# Patient Record
Sex: Male | Born: 1940 | Race: White | Hispanic: No | Marital: Married | Smoking: Former smoker
Health system: Southern US, Community
[De-identification: ages and names within clinical notes are randomized; demographics above are authoritative.]

## PROBLEM LIST (undated history)

## (undated) ENCOUNTER — Emergency Department (HOSPITAL_COMMUNITY): Admission: EM | Source: Ambulatory Visit | Attending: Emergency Medicine | Admitting: Emergency Medicine

## (undated) DIAGNOSIS — M545 Low back pain, unspecified: Secondary | ICD-10-CM

## (undated) DIAGNOSIS — I639 Cerebral infarction, unspecified: Secondary | ICD-10-CM

## (undated) DIAGNOSIS — M1990 Unspecified osteoarthritis, unspecified site: Secondary | ICD-10-CM

## (undated) DIAGNOSIS — R011 Cardiac murmur, unspecified: Secondary | ICD-10-CM

## (undated) DIAGNOSIS — E785 Hyperlipidemia, unspecified: Secondary | ICD-10-CM

## (undated) DIAGNOSIS — C801 Malignant (primary) neoplasm, unspecified: Secondary | ICD-10-CM

## (undated) DIAGNOSIS — J449 Chronic obstructive pulmonary disease, unspecified: Secondary | ICD-10-CM

## (undated) DIAGNOSIS — I2510 Atherosclerotic heart disease of native coronary artery without angina pectoris: Secondary | ICD-10-CM

## (undated) DIAGNOSIS — G459 Transient cerebral ischemic attack, unspecified: Secondary | ICD-10-CM

## (undated) DIAGNOSIS — I714 Abdominal aortic aneurysm, without rupture, unspecified: Secondary | ICD-10-CM

## (undated) DIAGNOSIS — K750 Abscess of liver: Secondary | ICD-10-CM

## (undated) DIAGNOSIS — Z923 Personal history of irradiation: Secondary | ICD-10-CM

## (undated) DIAGNOSIS — B019 Varicella without complication: Secondary | ICD-10-CM

## (undated) DIAGNOSIS — F32A Depression, unspecified: Secondary | ICD-10-CM

## (undated) DIAGNOSIS — I350 Nonrheumatic aortic (valve) stenosis: Secondary | ICD-10-CM

## (undated) DIAGNOSIS — K449 Diaphragmatic hernia without obstruction or gangrene: Secondary | ICD-10-CM

## (undated) DIAGNOSIS — N400 Enlarged prostate without lower urinary tract symptoms: Secondary | ICD-10-CM

## (undated) DIAGNOSIS — J189 Pneumonia, unspecified organism: Secondary | ICD-10-CM

## (undated) DIAGNOSIS — G8929 Other chronic pain: Secondary | ICD-10-CM

## (undated) DIAGNOSIS — D126 Benign neoplasm of colon, unspecified: Secondary | ICD-10-CM

## (undated) DIAGNOSIS — I509 Heart failure, unspecified: Secondary | ICD-10-CM

## (undated) DIAGNOSIS — I10 Essential (primary) hypertension: Secondary | ICD-10-CM

## (undated) DIAGNOSIS — B1910 Unspecified viral hepatitis B without hepatic coma: Secondary | ICD-10-CM

## (undated) DIAGNOSIS — K222 Esophageal obstruction: Secondary | ICD-10-CM

## (undated) DIAGNOSIS — K5790 Diverticulosis of intestine, part unspecified, without perforation or abscess without bleeding: Secondary | ICD-10-CM

## (undated) DIAGNOSIS — K219 Gastro-esophageal reflux disease without esophagitis: Secondary | ICD-10-CM

## (undated) DIAGNOSIS — R0600 Dyspnea, unspecified: Secondary | ICD-10-CM

## (undated) HISTORY — PX: CATARACT EXTRACTION W/ INTRAOCULAR LENS  IMPLANT, BILATERAL: SHX1307

## (undated) HISTORY — DX: Cerebral infarction, unspecified: I63.9

## (undated) HISTORY — DX: Gastro-esophageal reflux disease without esophagitis: K21.9

## (undated) HISTORY — DX: Varicella without complication: B01.9

## (undated) HISTORY — DX: Unspecified osteoarthritis, unspecified site: M19.90

## (undated) HISTORY — PX: BACK SURGERY: SHX140

## (undated) HISTORY — DX: Diaphragmatic hernia without obstruction or gangrene: K44.9

## (undated) HISTORY — PX: MULTIPLE TOOTH EXTRACTIONS: SHX2053

## (undated) HISTORY — DX: Hyperlipidemia, unspecified: E78.5

## (undated) HISTORY — DX: Nonrheumatic aortic (valve) stenosis: I35.0

## (undated) HISTORY — DX: Diverticulosis of intestine, part unspecified, without perforation or abscess without bleeding: K57.90

## (undated) HISTORY — DX: Transient cerebral ischemic attack, unspecified: G45.9

## (undated) HISTORY — PX: VASCULAR SURGERY: SHX849

## (undated) HISTORY — DX: Benign neoplasm of colon, unspecified: D12.6

## (undated) HISTORY — DX: Essential (primary) hypertension: I10

## (undated) HISTORY — PX: ESOPHAGOGASTRODUODENOSCOPY (EGD) WITH ESOPHAGEAL DILATION: SHX5812

## (undated) HISTORY — PX: PROSTATE BIOPSY: SHX241

## (undated) HISTORY — DX: Benign prostatic hyperplasia without lower urinary tract symptoms: N40.0

## (undated) HISTORY — DX: Atherosclerotic heart disease of native coronary artery without angina pectoris: I25.10

## (undated) HISTORY — DX: Esophageal obstruction: K22.2

## (undated) HISTORY — PX: CARDIAC CATHETERIZATION: SHX172

## (undated) HISTORY — PX: TONSILLECTOMY: SUR1361

---

## 1982-02-05 DIAGNOSIS — B1910 Unspecified viral hepatitis B without hepatic coma: Secondary | ICD-10-CM

## 1982-02-05 HISTORY — PX: LAPAROSCOPIC CHOLECYSTECTOMY: SUR755

## 1982-02-05 HISTORY — DX: Unspecified viral hepatitis B without hepatic coma: B19.10

## 1992-02-06 HISTORY — PX: LAPAROSCOPIC CHOLECYSTECTOMY: SUR755

## 1996-05-06 HISTORY — PX: LUMBAR DISC SURGERY: SHX700

## 1996-09-05 HISTORY — PX: POSTERIOR LUMBAR FUSION: SHX6036

## 1998-05-13 ENCOUNTER — Emergency Department (HOSPITAL_COMMUNITY): Admission: EM | Admit: 1998-05-13 | Discharge: 1998-05-13 | Payer: Self-pay | Admitting: Emergency Medicine

## 1998-05-13 ENCOUNTER — Encounter: Payer: Self-pay | Admitting: Emergency Medicine

## 1998-08-11 ENCOUNTER — Encounter: Payer: Self-pay | Admitting: Gastroenterology

## 1998-12-06 ENCOUNTER — Encounter: Payer: Self-pay | Admitting: Pulmonary Disease

## 1998-12-06 ENCOUNTER — Inpatient Hospital Stay (HOSPITAL_COMMUNITY): Admission: AD | Admit: 1998-12-06 | Discharge: 1998-12-08 | Payer: Self-pay | Admitting: Pulmonary Disease

## 1998-12-06 ENCOUNTER — Encounter: Payer: Self-pay | Admitting: Neurology

## 1998-12-07 ENCOUNTER — Encounter: Payer: Self-pay | Admitting: Pulmonary Disease

## 2002-09-28 ENCOUNTER — Ambulatory Visit (HOSPITAL_COMMUNITY): Admission: RE | Admit: 2002-09-28 | Discharge: 2002-09-28 | Payer: Self-pay | Admitting: Neurosurgery

## 2002-09-28 ENCOUNTER — Encounter: Payer: Self-pay | Admitting: Neurosurgery

## 2002-10-06 ENCOUNTER — Encounter: Payer: Self-pay | Admitting: Neurosurgery

## 2002-10-07 HISTORY — PX: LUMBAR LAMINECTOMY/DECOMPRESSION MICRODISCECTOMY: SHX5026

## 2002-10-08 ENCOUNTER — Inpatient Hospital Stay (HOSPITAL_COMMUNITY): Admission: RE | Admit: 2002-10-08 | Discharge: 2002-10-09 | Payer: Self-pay | Admitting: Neurosurgery

## 2002-10-08 ENCOUNTER — Encounter: Payer: Self-pay | Admitting: Neurosurgery

## 2003-12-15 ENCOUNTER — Ambulatory Visit: Payer: Self-pay | Admitting: Pulmonary Disease

## 2004-01-11 ENCOUNTER — Ambulatory Visit: Payer: Self-pay | Admitting: Pulmonary Disease

## 2004-02-18 ENCOUNTER — Ambulatory Visit: Payer: Self-pay | Admitting: Pulmonary Disease

## 2004-07-19 ENCOUNTER — Ambulatory Visit: Payer: Self-pay | Admitting: Pulmonary Disease

## 2004-11-29 ENCOUNTER — Ambulatory Visit: Payer: Self-pay | Admitting: Pulmonary Disease

## 2005-01-15 ENCOUNTER — Ambulatory Visit: Payer: Self-pay | Admitting: Pulmonary Disease

## 2005-01-22 ENCOUNTER — Ambulatory Visit: Payer: Self-pay | Admitting: Pulmonary Disease

## 2005-05-24 ENCOUNTER — Ambulatory Visit: Payer: Self-pay | Admitting: Pulmonary Disease

## 2005-11-20 ENCOUNTER — Ambulatory Visit: Payer: Self-pay | Admitting: Pulmonary Disease

## 2006-01-24 ENCOUNTER — Ambulatory Visit: Payer: Self-pay | Admitting: Internal Medicine

## 2006-01-24 ENCOUNTER — Ambulatory Visit: Payer: Self-pay | Admitting: Pulmonary Disease

## 2006-02-18 ENCOUNTER — Ambulatory Visit: Payer: Self-pay | Admitting: Internal Medicine

## 2006-11-14 ENCOUNTER — Ambulatory Visit: Payer: Self-pay | Admitting: Pulmonary Disease

## 2006-12-17 ENCOUNTER — Ambulatory Visit: Payer: Self-pay | Admitting: Pulmonary Disease

## 2006-12-30 ENCOUNTER — Ambulatory Visit: Payer: Self-pay | Admitting: Pulmonary Disease

## 2006-12-30 LAB — CONVERTED CEMR LAB
ALT: 23 units/L (ref 0–53)
AST: 21 units/L (ref 0–37)
Albumin: 3.8 g/dL (ref 3.5–5.2)
Alkaline Phosphatase: 102 units/L (ref 39–117)
BUN: 10 mg/dL (ref 6–23)
Basophils Absolute: 0.1 10*3/uL (ref 0.0–0.1)
Basophils Relative: 1.5 % — ABNORMAL HIGH (ref 0.0–1.0)
Bilirubin, Direct: 0.1 mg/dL (ref 0.0–0.3)
CO2: 28 meq/L (ref 19–32)
Calcium: 9.3 mg/dL (ref 8.4–10.5)
Chloride: 104 meq/L (ref 96–112)
Cholesterol: 162 mg/dL (ref 0–200)
Creatinine, Ser: 0.9 mg/dL (ref 0.4–1.5)
Eosinophils Absolute: 0.2 10*3/uL (ref 0.0–0.6)
Eosinophils Relative: 3.4 % (ref 0.0–5.0)
GFR calc Af Amer: 109 mL/min
GFR calc non Af Amer: 90 mL/min
Glucose, Bld: 115 mg/dL — ABNORMAL HIGH (ref 70–99)
HCT: 43.5 % (ref 39.0–52.0)
HDL: 30.1 mg/dL — ABNORMAL LOW (ref >39.0)
Hemoglobin: 15.3 g/dL (ref 13.0–17.0)
LDL Cholesterol: 101 mg/dL — ABNORMAL HIGH (ref 0–99)
Lymphocytes Relative: 26.8 % (ref 12.0–46.0)
MCHC: 35.1 g/dL (ref 30.0–36.0)
MCV: 87.6 fL (ref 78.0–100.0)
Monocytes Absolute: 0.5 10*3/uL (ref 0.2–0.7)
Monocytes Relative: 7.6 % (ref 3.0–11.0)
Neutro Abs: 4.3 10*3/uL (ref 1.4–7.7)
Neutrophils Relative %: 60.7 % (ref 43.0–77.0)
PSA: 3.32 ng/mL (ref 0.10–4.00)
Platelets: 209 10*3/uL (ref 150–400)
Potassium: 4.5 meq/L (ref 3.5–5.1)
RBC: 4.97 M/uL (ref 4.22–5.81)
RDW: 12.1 % (ref 11.5–14.6)
Sodium: 140 meq/L (ref 135–145)
TSH: 0.73 microintl units/mL (ref 0.35–5.50)
Total Bilirubin: 0.7 mg/dL (ref 0.3–1.2)
Total CHOL/HDL Ratio: 5.4
Total Protein: 6.7 g/dL (ref 6.0–8.3)
Triglycerides: 153 mg/dL — ABNORMAL HIGH (ref 0–149)
VLDL: 31 mg/dL (ref 0–40)
WBC: 6.9 10*3/uL (ref 4.5–10.5)

## 2007-02-06 DIAGNOSIS — D126 Benign neoplasm of colon, unspecified: Secondary | ICD-10-CM

## 2007-02-06 HISTORY — DX: Benign neoplasm of colon, unspecified: D12.6

## 2007-03-17 ENCOUNTER — Telehealth: Payer: Self-pay | Admitting: Pulmonary Disease

## 2007-03-19 DIAGNOSIS — F411 Generalized anxiety disorder: Secondary | ICD-10-CM | POA: Insufficient documentation

## 2007-03-19 DIAGNOSIS — I10 Essential (primary) hypertension: Secondary | ICD-10-CM | POA: Insufficient documentation

## 2007-03-19 DIAGNOSIS — K219 Gastro-esophageal reflux disease without esophagitis: Secondary | ICD-10-CM | POA: Insufficient documentation

## 2007-03-19 DIAGNOSIS — E785 Hyperlipidemia, unspecified: Secondary | ICD-10-CM | POA: Insufficient documentation

## 2007-08-27 ENCOUNTER — Ambulatory Visit: Payer: Self-pay | Admitting: Pulmonary Disease

## 2007-08-27 DIAGNOSIS — K5730 Diverticulosis of large intestine without perforation or abscess without bleeding: Secondary | ICD-10-CM | POA: Insufficient documentation

## 2007-08-27 DIAGNOSIS — G463 Brain stem stroke syndrome: Secondary | ICD-10-CM | POA: Insufficient documentation

## 2007-08-27 DIAGNOSIS — Z87438 Personal history of other diseases of male genital organs: Secondary | ICD-10-CM | POA: Insufficient documentation

## 2007-08-27 DIAGNOSIS — M545 Low back pain, unspecified: Secondary | ICD-10-CM | POA: Insufficient documentation

## 2007-08-27 DIAGNOSIS — Z87898 Personal history of other specified conditions: Secondary | ICD-10-CM

## 2007-08-27 DIAGNOSIS — I6350 Cerebral infarction due to unspecified occlusion or stenosis of unspecified cerebral artery: Secondary | ICD-10-CM

## 2007-08-27 DIAGNOSIS — J309 Allergic rhinitis, unspecified: Secondary | ICD-10-CM | POA: Insufficient documentation

## 2007-09-01 ENCOUNTER — Ambulatory Visit: Payer: Self-pay | Admitting: Pulmonary Disease

## 2007-09-11 ENCOUNTER — Telehealth (INDEPENDENT_AMBULATORY_CARE_PROVIDER_SITE_OTHER): Payer: Self-pay | Admitting: *Deleted

## 2007-09-11 LAB — CONVERTED CEMR LAB
AST: 18 units/L (ref 0–37)
Basophils Absolute: 0.1 10*3/uL (ref 0.0–0.1)
Basophils Relative: 0.8 % (ref 0.0–3.0)
Chloride: 107 meq/L (ref 96–112)
Cholesterol: 146 mg/dL (ref 0–200)
Creatinine, Ser: 0.8 mg/dL (ref 0.4–1.5)
Eosinophils Absolute: 0.2 10*3/uL (ref 0.0–0.7)
GFR calc Af Amer: 125 mL/min
GFR calc non Af Amer: 103 mL/min
HDL: 26.9 mg/dL — ABNORMAL LOW (ref >39.0)
MCHC: 34.3 g/dL (ref 30.0–36.0)
MCV: 89.9 fL (ref 78.0–100.0)
Monocytes Absolute: 0.4 10*3/uL (ref 0.1–1.0)
Neutrophils Relative %: 65.7 % (ref 43.0–77.0)
PSA: 2.18 ng/mL (ref 0.10–4.00)
Platelets: 193 10*3/uL (ref 150–400)
Potassium: 4.5 meq/L (ref 3.5–5.1)
Total Bilirubin: 0.6 mg/dL (ref 0.3–1.2)
VLDL: 38 mg/dL (ref 0–40)

## 2007-09-16 ENCOUNTER — Encounter: Payer: Self-pay | Admitting: Pulmonary Disease

## 2007-09-16 ENCOUNTER — Ambulatory Visit: Payer: Self-pay

## 2007-09-24 ENCOUNTER — Ambulatory Visit: Payer: Self-pay | Admitting: Gastroenterology

## 2007-09-24 ENCOUNTER — Telehealth: Payer: Self-pay | Admitting: Pulmonary Disease

## 2007-09-24 DIAGNOSIS — K591 Functional diarrhea: Secondary | ICD-10-CM | POA: Insufficient documentation

## 2007-10-03 ENCOUNTER — Ambulatory Visit: Payer: Self-pay

## 2007-10-09 ENCOUNTER — Ambulatory Visit: Payer: Self-pay | Admitting: Cardiology

## 2007-10-21 ENCOUNTER — Encounter: Payer: Self-pay | Admitting: Gastroenterology

## 2007-10-21 ENCOUNTER — Ambulatory Visit: Payer: Self-pay | Admitting: Gastroenterology

## 2007-10-22 ENCOUNTER — Encounter: Payer: Self-pay | Admitting: Gastroenterology

## 2007-10-31 ENCOUNTER — Telehealth (INDEPENDENT_AMBULATORY_CARE_PROVIDER_SITE_OTHER): Payer: Self-pay | Admitting: *Deleted

## 2007-11-11 ENCOUNTER — Encounter: Payer: Self-pay | Admitting: Pulmonary Disease

## 2007-12-01 ENCOUNTER — Ambulatory Visit: Payer: Self-pay | Admitting: Gastroenterology

## 2007-12-01 DIAGNOSIS — Z860100 Personal history of colon polyps, unspecified: Secondary | ICD-10-CM | POA: Insufficient documentation

## 2007-12-04 ENCOUNTER — Ambulatory Visit: Payer: Self-pay | Admitting: Pulmonary Disease

## 2007-12-16 ENCOUNTER — Encounter: Payer: Self-pay | Admitting: Pulmonary Disease

## 2008-01-28 DIAGNOSIS — I359 Nonrheumatic aortic valve disorder, unspecified: Secondary | ICD-10-CM

## 2008-01-28 DIAGNOSIS — I350 Nonrheumatic aortic (valve) stenosis: Secondary | ICD-10-CM | POA: Insufficient documentation

## 2008-02-25 ENCOUNTER — Telehealth: Payer: Self-pay | Admitting: Pulmonary Disease

## 2008-03-29 ENCOUNTER — Telehealth: Payer: Self-pay | Admitting: Pulmonary Disease

## 2008-05-20 ENCOUNTER — Ambulatory Visit: Payer: Self-pay | Admitting: Pulmonary Disease

## 2008-05-25 ENCOUNTER — Ambulatory Visit: Payer: Self-pay | Admitting: Pulmonary Disease

## 2008-05-30 LAB — CONVERTED CEMR LAB
AST: 20 units/L (ref 0–37)
Alkaline Phosphatase: 105 units/L (ref 39–117)
BUN: 7 mg/dL (ref 6–23)
Basophils Absolute: 0.2 10*3/uL — ABNORMAL HIGH (ref 0.0–0.1)
Chloride: 108 meq/L (ref 96–112)
Cholesterol: 144 mg/dL (ref 0–200)
Direct LDL: 81.2 mg/dL
Eosinophils Absolute: 0.2 10*3/uL (ref 0.0–0.7)
GFR calc non Af Amer: 102.91 mL/min (ref >60)
HDL: 29 mg/dL — ABNORMAL LOW (ref >39.00)
Hemoglobin, Urine: NEGATIVE
Hemoglobin: 15.2 g/dL (ref 13.0–17.0)
Lymphocytes Relative: 25.2 % (ref 12.0–46.0)
MCHC: 34.1 g/dL (ref 30.0–36.0)
Monocytes Relative: 5.2 % (ref 3.0–12.0)
Neutrophils Relative %: 64.3 % (ref 43.0–77.0)
Nitrite: NEGATIVE
Platelets: 169 10*3/uL (ref 150.0–400.0)
Potassium: 4.4 meq/L (ref 3.5–5.1)
RDW: 12.5 % (ref 11.5–14.6)
Sodium: 144 meq/L (ref 135–145)
Specific Gravity, Urine: 1.015 (ref 1.000–1.030)
Total Bilirubin: 0.7 mg/dL (ref 0.3–1.2)
Total CHOL/HDL Ratio: 5
Total Protein, Urine: NEGATIVE mg/dL
Triglycerides: 205 mg/dL — ABNORMAL HIGH (ref 0.0–149.0)
Urine Glucose: NEGATIVE mg/dL
Urobilinogen, UA: 0.2 (ref 0.0–1.0)
VLDL: 41 mg/dL — ABNORMAL HIGH (ref 0.0–40.0)
Vit D, 25-Hydroxy: 33 ng/mL (ref 30–89)

## 2008-06-22 ENCOUNTER — Telehealth (INDEPENDENT_AMBULATORY_CARE_PROVIDER_SITE_OTHER): Payer: Self-pay | Admitting: *Deleted

## 2008-08-02 ENCOUNTER — Ambulatory Visit: Payer: Self-pay | Admitting: Cardiology

## 2008-08-02 DIAGNOSIS — R079 Chest pain, unspecified: Secondary | ICD-10-CM

## 2008-08-02 DIAGNOSIS — I209 Angina pectoris, unspecified: Secondary | ICD-10-CM | POA: Insufficient documentation

## 2008-08-11 ENCOUNTER — Telehealth (INDEPENDENT_AMBULATORY_CARE_PROVIDER_SITE_OTHER): Payer: Self-pay

## 2008-08-12 ENCOUNTER — Encounter: Payer: Self-pay | Admitting: Cardiology

## 2008-08-12 ENCOUNTER — Ambulatory Visit: Payer: Self-pay

## 2008-08-17 ENCOUNTER — Encounter: Payer: Self-pay | Admitting: Cardiology

## 2008-08-30 ENCOUNTER — Ambulatory Visit: Payer: Self-pay | Admitting: Cardiology

## 2008-09-01 ENCOUNTER — Ambulatory Visit: Payer: Self-pay | Admitting: Cardiology

## 2008-09-01 ENCOUNTER — Inpatient Hospital Stay (HOSPITAL_BASED_OUTPATIENT_CLINIC_OR_DEPARTMENT_OTHER): Admission: RE | Admit: 2008-09-01 | Discharge: 2008-09-01 | Payer: Self-pay | Admitting: Cardiology

## 2008-09-03 LAB — CONVERTED CEMR LAB
BUN: 10 mg/dL (ref 6–23)
Basophils Relative: 0.3 % (ref 0.0–3.0)
Chloride: 106 meq/L (ref 96–112)
Creatinine, Ser: 0.8 mg/dL (ref 0.4–1.5)
Eosinophils Relative: 2.6 % (ref 0.0–5.0)
GFR calc non Af Amer: 102.83 mL/min (ref >60)
INR: 0.9 (ref 0.8–1.0)
MCV: 88.8 fL (ref 78.0–100.0)
Monocytes Relative: 7.9 % (ref 3.0–12.0)
Neutrophils Relative %: 63.3 % (ref 43.0–77.0)
Platelets: 183 10*3/uL (ref 150.0–400.0)
Potassium: 4.2 meq/L (ref 3.5–5.1)
Prothrombin Time: 9.9 s — ABNORMAL LOW (ref 10.9–13.3)
RBC: 4.81 M/uL (ref 4.22–5.81)
WBC: 7.5 10*3/uL (ref 4.5–10.5)

## 2008-09-15 ENCOUNTER — Ambulatory Visit: Payer: Self-pay | Admitting: Cardiology

## 2008-09-15 DIAGNOSIS — I2510 Atherosclerotic heart disease of native coronary artery without angina pectoris: Secondary | ICD-10-CM | POA: Insufficient documentation

## 2008-10-15 ENCOUNTER — Encounter: Payer: Self-pay | Admitting: Pulmonary Disease

## 2008-10-19 ENCOUNTER — Encounter (INDEPENDENT_AMBULATORY_CARE_PROVIDER_SITE_OTHER): Payer: Self-pay | Admitting: *Deleted

## 2008-11-23 ENCOUNTER — Ambulatory Visit: Payer: Self-pay | Admitting: Pulmonary Disease

## 2008-12-21 ENCOUNTER — Telehealth: Payer: Self-pay | Admitting: Pulmonary Disease

## 2008-12-26 LAB — CONVERTED CEMR LAB
ALT: 19 units/L (ref 0–53)
AST: 18 units/L (ref 0–37)
Basophils Relative: 0.8 % (ref 0.0–3.0)
Bilirubin, Direct: 0.1 mg/dL (ref 0.0–0.3)
Chloride: 105 meq/L (ref 96–112)
Cholesterol: 133 mg/dL (ref 0–200)
Eosinophils Relative: 2.9 % (ref 0.0–5.0)
HCT: 42.2 % (ref 39.0–52.0)
HDL: 28.1 mg/dL — ABNORMAL LOW (ref >39.00)
LDL Cholesterol: 68 mg/dL (ref 0–99)
Lymphs Abs: 2.4 10*3/uL (ref 0.7–4.0)
MCV: 89.4 fL (ref 78.0–100.0)
Monocytes Absolute: 0.4 10*3/uL (ref 0.1–1.0)
Monocytes Relative: 5.6 % (ref 3.0–12.0)
Potassium: 4.3 meq/L (ref 3.5–5.1)
RBC: 4.72 M/uL (ref 4.22–5.81)
Sodium: 141 meq/L (ref 135–145)
Total CHOL/HDL Ratio: 5
Total Protein: 6.8 g/dL (ref 6.0–8.3)
Triglycerides: 187 mg/dL — ABNORMAL HIGH (ref 0.0–149.0)
VLDL: 37.4 mg/dL (ref 0.0–40.0)
WBC: 8 10*3/uL (ref 4.5–10.5)

## 2009-01-10 ENCOUNTER — Ambulatory Visit: Payer: Self-pay | Admitting: Pulmonary Disease

## 2009-01-10 DIAGNOSIS — M79609 Pain in unspecified limb: Secondary | ICD-10-CM | POA: Insufficient documentation

## 2009-01-13 DIAGNOSIS — L409 Psoriasis, unspecified: Secondary | ICD-10-CM | POA: Insufficient documentation

## 2009-01-13 DIAGNOSIS — L408 Other psoriasis: Secondary | ICD-10-CM

## 2009-01-19 ENCOUNTER — Ambulatory Visit: Payer: Self-pay | Admitting: Cardiology

## 2009-01-21 ENCOUNTER — Ambulatory Visit: Payer: Self-pay | Admitting: Cardiology

## 2009-01-21 LAB — CONVERTED CEMR LAB
AST: 17 units/L (ref 0–37)
Albumin: 3.7 g/dL (ref 3.5–5.2)
Alkaline Phosphatase: 90 units/L (ref 39–117)
Bilirubin, Direct: 0.1 mg/dL (ref 0.0–0.3)
Total CHOL/HDL Ratio: 5
Triglycerides: 176 mg/dL — ABNORMAL HIGH (ref 0.0–149.0)

## 2009-05-23 ENCOUNTER — Ambulatory Visit: Payer: Self-pay | Admitting: Pulmonary Disease

## 2009-05-25 ENCOUNTER — Ambulatory Visit: Payer: Self-pay | Admitting: Pulmonary Disease

## 2009-05-29 DIAGNOSIS — M1990 Unspecified osteoarthritis, unspecified site: Secondary | ICD-10-CM | POA: Insufficient documentation

## 2009-05-29 LAB — CONVERTED CEMR LAB
ALT: 20 units/L (ref 0–53)
AST: 21 units/L (ref 0–37)
Albumin: 3.9 g/dL (ref 3.5–5.2)
Alkaline Phosphatase: 76 units/L (ref 39–117)
BUN: 10 mg/dL (ref 6–23)
Basophils Absolute: 0 10*3/uL (ref 0.0–0.1)
Basophils Relative: 0.4 % (ref 0.0–3.0)
CO2: 27 meq/L (ref 19–32)
Calcium: 9 mg/dL (ref 8.4–10.5)
Chloride: 108 meq/L (ref 96–112)
Creatinine, Ser: 0.8 mg/dL (ref 0.4–1.5)
Eosinophils Relative: 3.7 % (ref 0.0–5.0)
Glucose, Bld: 107 mg/dL — ABNORMAL HIGH (ref 70–99)
HCT: 40.3 % (ref 39.0–52.0)
Hemoglobin: 13.7 g/dL (ref 13.0–17.0)
LDL Cholesterol: 53 mg/dL (ref 0–99)
Lymphocytes Relative: 23.1 % (ref 12.0–46.0)
Lymphs Abs: 1.8 10*3/uL (ref 0.7–4.0)
Monocytes Relative: 5.7 % (ref 3.0–12.0)
Neutro Abs: 5.3 10*3/uL (ref 1.4–7.7)
RBC: 4.64 M/uL (ref 4.22–5.81)
TSH: 0.85 microintl units/mL (ref 0.35–5.50)
Total CHOL/HDL Ratio: 4
Triglycerides: 181 mg/dL — ABNORMAL HIGH (ref 0.0–149.0)
WBC: 7.9 10*3/uL (ref 4.5–10.5)

## 2009-07-12 ENCOUNTER — Telehealth: Payer: Self-pay | Admitting: Pulmonary Disease

## 2009-08-01 ENCOUNTER — Encounter: Payer: Self-pay | Admitting: Pulmonary Disease

## 2009-08-01 ENCOUNTER — Encounter: Payer: Self-pay | Admitting: Adult Health

## 2009-08-11 ENCOUNTER — Ambulatory Visit: Payer: Self-pay | Admitting: Cardiology

## 2009-08-26 ENCOUNTER — Telehealth: Payer: Self-pay | Admitting: Cardiology

## 2009-08-31 ENCOUNTER — Encounter: Payer: Self-pay | Admitting: Pulmonary Disease

## 2009-10-25 ENCOUNTER — Telehealth (INDEPENDENT_AMBULATORY_CARE_PROVIDER_SITE_OTHER): Payer: Self-pay | Admitting: *Deleted

## 2009-10-27 ENCOUNTER — Ambulatory Visit: Payer: Self-pay | Admitting: Pulmonary Disease

## 2010-01-25 ENCOUNTER — Ambulatory Visit: Payer: Self-pay | Admitting: Pulmonary Disease

## 2010-01-31 ENCOUNTER — Ambulatory Visit: Payer: Self-pay | Admitting: Pulmonary Disease

## 2010-02-05 LAB — CONVERTED CEMR LAB
ALT: 18 units/L (ref 0–53)
Alkaline Phosphatase: 78 units/L (ref 39–117)
Basophils Absolute: 0 10*3/uL (ref 0.0–0.1)
Bilirubin, Direct: 0.1 mg/dL (ref 0.0–0.3)
CO2: 29 meq/L (ref 19–32)
Chloride: 104 meq/L (ref 96–112)
Eosinophils Absolute: 0.2 10*3/uL (ref 0.0–0.7)
Hemoglobin: 14.8 g/dL (ref 13.0–17.0)
Lymphocytes Relative: 23.9 % (ref 12.0–46.0)
MCHC: 34.4 g/dL (ref 30.0–36.0)
Neutro Abs: 5 10*3/uL (ref 1.4–7.7)
Neutrophils Relative %: 67.1 % (ref 43.0–77.0)
Potassium: 4 meq/L (ref 3.5–5.1)
RDW: 13.6 % (ref 11.5–14.6)
Sodium: 140 meq/L (ref 135–145)
Total CHOL/HDL Ratio: 5
Total Protein: 6.7 g/dL (ref 6.0–8.3)

## 2010-02-15 ENCOUNTER — Ambulatory Visit: Admission: RE | Admit: 2010-02-15 | Discharge: 2010-02-15 | Payer: Self-pay | Source: Home / Self Care

## 2010-02-15 ENCOUNTER — Encounter: Payer: Self-pay | Admitting: Cardiology

## 2010-03-03 ENCOUNTER — Telehealth: Payer: Self-pay | Admitting: Cardiology

## 2010-03-07 NOTE — Assessment & Plan Note (Signed)
Summary: rov   Primary Provider:  Alroy Dust MD   CC:  rov/ pt wants to follow up on labs done 4/20/  Pt states that due to his osteoarthritis he has not been able to exercise as much.  History of Present Illness: 70 yo with history of CAD, smoking, HTN, and hyperlipidemia presents for followup.  Patient had described some exertional chest pain last summer and myoview showed a partially reversible inferior defect, so LHC was done in 7/10.  This showed that his proximal RCA was totally occluded.  There were very robust left to right collaterals.  There was also a 50% proximal LAD stenosis.  The patient was managed medically.  Since that time, he has been doing well symptomatically.  He has had no further exertional chest pain.  He has good exercise tolerance without exertional dyspnea.  He is still doing some furniture repair work in his shop behind his house.  His main complaint is some mild lightheadedness when he stands after sitting for a prolonged period of time.  BP was elevated initially this morning (142/84), but SBP was in the 120s when rechecked.  Arthritis (hands, elbows, toes) is limiting his exercise though he plans to join the [employer] to do some swimming.  He saw a rheumatologist who thought the joint pains were due to osteoarthritis.    He is under a lot of stress right now.  His wife has been having a lot of trouble with her family, and he feels like she is taking a lot of the frustration out on him.   Labs (12/10): HDL 28, LDL 70, K 4.3, creatinine 0.8 Labs (4/11): K 4, creatinine 0.8, LDL 53, HDL 26, LFTs normal  ECG: NSR, normal  Current Medications (verified): 1)  Aspirin 81 Mg Tbec (Aspirin) .... Take 1   Tablet  By Mouth Daily 2)  Lopressor 50 Mg  Tabs (Metoprolol Tartrate) .... Take 1 Tab By Mouth Two Times A Day... 3)  Amlodipine Besylate 10 Mg Tabs (Amlodipine Besylate) .... Take 1 Tablet By Mouth Once A Day 4)  Benazepril Hcl 20 Mg Tabs (Benazepril Hcl) .... Take 1 Tablet  By Mouth Once A Day 5)  Imdur 30 Mg Xr24h-Tab (Isosorbide Mononitrate) .... Take One Tablet Once Daily 6)  Nitroglycerin 0.4 Mg Subl (Nitroglycerin) .Marland Kitchen.. 1 Under Your Tongue As Needed For Chest Pain-May Use Every 5 Minutes X 3 7)  Crestor 40 Mg Tabs (Rosuvastatin Calcium) .... Take One-Half  Tablet Daily 8)  Fish Oil 1000 Mg Caps (Omega-3 Fatty Acids) .... Take 2  Capsules  By Mouth Two Times A Day 9)  Gnp Omeprazole 20 Mg  Tbec (Omeprazole) .... Take 1 Tablet By Mouth Once A Day 10)  Imodium A-D 2 Mg  Tabs (Loperamide Hcl) .... Take 1 Tablet By Mouth Once A Day 11)  Cholestyramine   Powd (Cholestyramine) .... Take As Directed... 12)  Nabumetone 500 Mg Tabs (Nabumetone) .... Take One Tablet By Mouth Two Times A Day As Needed For Arthritis Pain... 13)  Vicodin 5-500 Mg Tabs (Hydrocodone-Acetaminophen) .... Take One Tablet By Mouth Three Times A Day As Needed For Pain 14)  Glucosamine Sulfate   Powd (Glucosamine Sulfate) .... 1/2 Cap With Water Two Times A Day As Needed 15)  Betamethasone Dipropionate 0.05 % Crea (Betamethasone Dipropionate) .... Apply Two Times A Day To Psoriasis On Knees As Needed 16)  Tylenol Extra Strength 500 Mg Tabs (Acetaminophen) .... Take Two Tablets Three Times A Day Not To Exceed 6  Daily.  Only Take One Tablet When Vicodin Is Used  Allergies (verified): 1)  ! Celebrex  Past History:  Past Medical History: 1. Hyperlipidemia 2. Minimal aortic stenosis.  Echocardiogram in August 2009 showed EF 60%, no regional wall motion abnormalities, mild LVH, moderate focal basal septal hypertrophy, and mild diastolic dysfunction.  There was a very mild aortic stenosis with partially fused left and right coronary cusps and some restricted motion of the aortic valve.  The mean gradient, however, across the aortic valve was only 8 mmHg.  There was also mild left atrial enlargement and normal RV size and function.  Minimal AS on LHC in 7/10.  3. Hypertension. 4. Gastroesophageal reflux  disease. 5. BPH. 6. Diverticulosis. 7. Smoked until 7/10. 8. CAD: LHC (7/10) showed totally occluded proximal RCA with very robust left to right collaterals, 50% proximal LAD stenosis, EF 65%.  Medical management.   9. Active smoker.  10. Colonic polyps 11. h/o TIA 12. 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  This manifested with slurred speech. 13. History of low back pain.  The patient is status post surgical back fusion. 14. History of cholecystectomy.  15. Osteoarthritis  Family History: Reviewed history from 05/20/2008 and no changes required. Father alive age 36 w/ CAD & CABG at [facility] in 06/18/2007 Mother died age 18 w/ Alzheimer's disease, hx of DJD 1 Sibling- Brother alive & well w/ DJD in his hips  Social History: Reviewed history from 01/21/2009 and no changes required. Married- 3rd marriage, wife= Etta 2 children from 1st marriage, 4 step children Former smoker, 40+yrs of >1ppd, quit 7/10.  social alcohol retired on disability due to back former [employer] for hobby  Review of Systems       All systems reviewed and negative except as per HPI.   Vital Signs:  Patient profile:   70 year old male Height:      70 inches Weight:      184 pounds BMI:     26.50 Pulse rate:   62 / minute Pulse rhythm:   regular BP sitting:   142 / 84  (left arm) Cuff size:   regular  Vitals Entered By: Judithe Modest CMA (August 11, 2009 10:57 AM)  Physical Exam  General:  Well developed, well nourished, in no acute distress. Neck:  Neck supple, no JVD. No masses, thyromegaly or abnormal cervical nodes. Lungs:  Clear bilaterally to auscultation and percussion. Heart:  Non-displaced PMI, chest non-tender; regular rate and rhythm, S1, S2 without  rubs or gallops.  2/6 systolic crescendo-decrescendo early peaking murmur at RUSB.  Carotid upstroke normal, no bruit.  Pedals normal pulses. No edema, no varicosities. Abdomen:  Bowel sounds  positive; abdomen soft and non-tender without masses, organomegaly, or hernias noted. No hepatosplenomegaly. Extremities:  No clubbing or cyanosis. Neurologic:  Alert and oriented x 3. Psych:  Normal affect.   Impression & Recommendations:  Problem # 1:  CAD, NATIVE VESSEL (ICD-414.01) Occluded RCA with collaterals. No ischemic symptoms.  EF preserved.  Continue medical management. I encouraged him to join the [facility] for swimming.   Problem # 2:  HYPERTENSION (ICD-401.9) BP a little elevated initially but normal when rechecked.  Patient does have occasional orthostatic-type symptoms.  I asked him to try taking amlodipine in the morning and benazepril in the evening rather than taking them both together.   Problem # 3:  HYPERLIPIDEMIA (ICD-272.4) LDL at goal when last checked (4/11).   Problem #  4:  AORTIC STENOSIS (ICD-424.1) Minimal stenosis on echo.  Repeat echo in 2012.   Other Orders: EKG w/ Interpretation (93000)  Patient Instructions: 1)  Take  Amlodipine in the morning. 2)  Take Benazepril in the evening. 3)  Your physician has requested that you regularly monitor and record your blood pressure readings at home.  Please use the same machine at the same time of day to check your readings. I will call you in 2 weeks to get the readings. Luana Shu [phone]  4)  Your physician wants you to follow-up in: 6 months with Dr Shirlee Latch. You will receive a reminder letter in the mail two months in advance. If you don't receive a letter, please call our office to schedule the follow-up appointment.

## 2010-03-07 NOTE — Consult Note (Signed)
Summary: [facility]  [facility]   Imported By: Lennie Odor 09/06/2009 14:13:07  _____________________________________________________________________  External Attachment:    Type:   Image     Comment:   External Document

## 2010-03-07 NOTE — Progress Notes (Signed)
Summary: message for crestor  Phone Note Other Incoming   Summary of Call: called to let pts husband know her meds were ready to be picked up---he stated that the insurance is charging him $80 a month for the crestor 40mg ---he stated that he is to take 1/2 tab daily--want the rx to state----crestor 40mg    1 daily.  please advise.  thanks Randell Loop CMA  July 12, 2009 3:32 PM   Follow-up for Phone Call        per SN---we can not write for the crestor 40mg  for one daily if that is not how he is taking it. pt is coming by tomorrow to pick up his wife's meds so i will let him know at that time. Randell Loop [facility]  July 12, 2009 5:27 PM   Additional Follow-up for Phone Call Additional follow up Details #1::        called and spoke with pt and he is aware that samples of the crestor 20mg  have been left up front for him to pick up tomorrow along with a discount card that he will have to activate.  pt stated that he will speak with Dr. Shirlee Latch on his appt in a few weeks about this. Randell Loop CMA  July 13, 2009 4:06 PM

## 2010-03-07 NOTE — Progress Notes (Signed)
Summary: pt c/o stress and insomnia  Phone Note Call from Patient   Caller: Patient Call For: NADEL Summary of Call: WANTS TO FIND OUT IF THERE IS SOMEONE HE CAN TALK TO ABOUT HIS STRESS AND ANXIETY DUE TO HOME ISSUES  Initial call taken by: Lacinda Axon,  October 25, 2009 4:27 PM  Follow-up for Phone Call        Spoke with pt.  He c/o increased stress causing increased BP and insomnia.  He states that he feels like he needs med or ? counseling to help with this.  Appt with TP sched for 9/22 at 10:45 am. Follow-up by: Vernie Murders,  October 25, 2009 4:35 PM     Appended Document: flu vaccine for 10-2009    Clinical Lists Changes  Observations: Added new observation of FLU VAX: Historical (10/25/2009 15:00)       Immunization History:  Influenza Immunization History:    Influenza:  historical (10/25/2009)

## 2010-03-07 NOTE — Progress Notes (Signed)
Summary: B/P readings  Phone Note Outgoing Call   Call placed by: Katina Dung, RN, BSN,  August 26, 2009 10:20 AM Call placed to: Patient Summary of Call: B/P readings  Follow-up for Phone Call        talked with pt--medications adjusted--08/11/09 Amlodipine to take in AM and Benazepril to take in the  PM 127/72 in the morning--averages 90s/60s in the evening --averages pulse range 64-69 pt states dizziness has improved and he feels good will forward to Dr Shirlee Latch for review     Appended Document: B/P readings ok  Appended Document: B/P readings discussed with wife by telephone

## 2010-03-07 NOTE — Assessment & Plan Note (Signed)
Summary: NP visit --stress    Primary Bertice Risse/Referring Abdulhamid Olgin:  Alroy Dust MD   CC:  stress at home causing insomnia, HTN, and heart palpitations.  would like to discuss something to help calm him and help to sleep.  History of Present Illness: 70 y/o WM  with known hx of HTN, DJD, hyperlipidemia    ~  May 20, 2008:  he has had a busy year, but no current complaints or concerns... he had routine CXR 7/09 w/ low lung vol but no infiltrates etc... routine labs 7/09 showed sl incr TG & low HDL, FBS= 115, otherw neg- he's been trying to diet...       ** he saw DrNudelman w/ incr back pain in Nov09 & Relafen 500mg Bid has helped... long hx LBP w/ L4-5 discectomy 2008 & subseq Ray cage fusion, & prev L3-4 discectomy 2004...      ** he had a f/u colonoscopy 9/09 by DrStark w/ divertics, hems, & 5mm adenomatous polyp removed... f/u planned 49yrs.      ** he saw DrMcLean for Cards 9/09 for AS- 2DEcho w/ 94fused cusps, the degree of AS is mild... f/u planned in 56yr... no CP, palpit, dizziness or syncope...   ~  November 23, 2008:  Cardiac eval 7/10 by DrMcLean for CP w/ cath showing occluded RCA w/ collaterals & non-obstructive dis in other vessels- med Rx w/ ASA, Imdur, Benazepril + incr Lipitor... he finally quit smoking as well!!!  Under incr stress w/ step daughter's illness...  January 10, 2009--Presents for acute office visit with following complaints  1.  Complains of poss gout in right great toe x6-93months with sharp pains, redness and swelling.  2. red/flakiness and itchiness on both knees x63yrs (pt has seen dermatologist for this but would like another opinion)-dx w/ psoriasis   Denies chest pain, dyspnea, orthopnea, hemoptysis, fever, n/v/d, edema, headache.      October 27, 2009--Pt presents today w/ several issues. He is having enormous amount of stress at home with his wife who is also a patient of our clinic w/ Dr. Kriste Basque. He believes his at home is causing insomnia. He having alot  of trouble going to sleep and staying asleep. He would like to discuss something to help calm him and help to sleep. He has been married 15 yrs , having marital issues. They are not getting along, lots of issues with her kids-that are grown adults. They are going to see a marriage counselor soon. I offered to set this up however he already has the name of a counselor and is going to call for the appointment. Denies chest pain, dyspnea, orthopnea, hemoptysis, fever, n/v/d, edema, headache, suicidal /homicidal ideations. We discussed several options to help with anxiety and sleep. He justs wants something short term for now.    Medications Prior to Update: 1)  Aspirin 81 Mg Tbec (Aspirin) .... Take 1   Tablet  By Mouth Daily 2)  Lopressor 50 Mg  Tabs (Metoprolol Tartrate) .... Take 1 Tab By Mouth Two Times A Day... 3)  Amlodipine Besylate 10 Mg Tabs (Amlodipine Besylate) .... Take 1 Tablet By Mouth Once A Day in The Morning 4)  Benazepril Hcl 20 Mg Tabs (Benazepril Hcl) .... Take 1 Tablet By Mouth Once A Day in The Evening 5)  Imdur 30 Mg Xr24h-Tab (Isosorbide Mononitrate) .... Take One Tablet Once Daily 6)  Nitroglycerin 0.4 Mg Subl (Nitroglycerin) .Marland Kitchen.. 1 Under Your Tongue As Needed For Chest Pain-May Use Every 5 Minutes X  3 7)  Crestor 40 Mg Tabs (Rosuvastatin Calcium) .... Take One-Half  Tablet Daily 8)  Fish Oil 1000 Mg Caps (Omega-3 Fatty Acids) .... Take 2  Capsules  By Mouth Two Times A Day 9)  Gnp Omeprazole 20 Mg  Tbec (Omeprazole) .... Take 1 Tablet By Mouth Once A Day 10)  Imodium A-D 2 Mg  Tabs (Loperamide Hcl) .... Take 1 Tablet By Mouth Once A Day 11)  Cholestyramine   Powd (Cholestyramine) .... Take As Directed... 12)  Nabumetone 500 Mg Tabs (Nabumetone) .... Take One Tablet By Mouth Two Times A Day As Needed For Arthritis Pain... 13)  Vicodin 5-500 Mg Tabs (Hydrocodone-Acetaminophen) .... Take One Tablet By Mouth Three Times A Day As Needed For Pain 14)  Glucosamine Sulfate   Powd  (Glucosamine Sulfate) .... 1/2 Cap With Water Two Times A Day As Needed 15)  Betamethasone Dipropionate 0.05 % Crea (Betamethasone Dipropionate) .... Apply Two Times A Day To Psoriasis On Knees As Needed 16)  Tylenol Extra Strength 500 Mg Tabs (Acetaminophen) .... Take Two Tablets Three Times A Day Not To Exceed 6 Daily.  Only Take One Tablet When Vicodin Is Used  Current Medications (verified): 1)  Aspirin 81 Mg Tbec (Aspirin) .... Take 1   Tablet  By Mouth Daily 2)  Lopressor 50 Mg  Tabs (Metoprolol Tartrate) .... Take 1 Tab By Mouth Two Times A Day... 3)  Amlodipine Besylate 10 Mg Tabs (Amlodipine Besylate) .... Take 1 Tablet By Mouth Once A Day in The Morning 4)  Benazepril Hcl 20 Mg Tabs (Benazepril Hcl) .... Take 1 Tablet By Mouth Once A Day in The Evening 5)  Imdur 30 Mg Xr24h-Tab (Isosorbide Mononitrate) .... Take 1 Tab By Mouth At Bedtime 6)  Nitroglycerin 0.4 Mg Subl (Nitroglycerin) .Marland Kitchen.. 1 Under Your Tongue As Needed For Chest Pain-May Use Every 5 Minutes X 3 7)  Crestor 40 Mg Tabs (Rosuvastatin Calcium) .... Take One-Half  Tablet Daily 8)  Fish Oil 1000 Mg Caps (Omega-3 Fatty Acids) .... Take 2  Capsules  By Mouth Two Times A Day 9)  Gnp Omeprazole 20 Mg  Tbec (Omeprazole) .... Take 1 Tablet By Mouth Once A Day 10)  Imodium A-D 2 Mg  Tabs (Loperamide Hcl) .... Take 1 Tablet By Mouth Once A Day 11)  Cholestyramine   Powd (Cholestyramine) .... Take As Directed When Needed 12)  Nabumetone 500 Mg Tabs (Nabumetone) .... Take One Tablet By Mouth Two Times A Day As Needed For Arthritis Pain... 13)  Vicodin 5-500 Mg Tabs (Hydrocodone-Acetaminophen) .... Take One Tablet By Mouth Three Times A Day As Needed For Pain 14)  Glucosamine Sulfate   Powd (Glucosamine Sulfate) .... 1/2 Cap With Water Two Times A Day As Needed 15)  Betamethasone Dipropionate 0.05 % Crea (Betamethasone Dipropionate) .... Apply Two Times A Day To Psoriasis On Knees As Needed 16)  Tylenol Extra Strength 500 Mg Tabs  (Acetaminophen) .... Take Two Tablets Three Times A Day Not To Exceed 6 Daily.  Only Take One Tablet When Vicodin Is Used  Allergies (verified): 1)  ! Celebrex  Past History:  Past Medical History: Last updated: 08/11/2009 1. Hyperlipidemia 2. Minimal aortic stenosis.  Echocardiogram in August 2009 showed EF 60%, no regional wall motion abnormalities, mild LVH, moderate focal basal septal hypertrophy, and mild diastolic dysfunction.  There was a very mild aortic stenosis with partially fused left and right coronary cusps and some restricted motion of the aortic valve.  The mean gradient, however,  across the aortic valve was only 8 mmHg.  There was also mild left atrial enlargement and normal RV size and function.  Minimal AS on LHC in 7/10.  3. Hypertension. 4. Gastroesophageal reflux disease. 5. BPH. 6. Diverticulosis. 7. Smoked until 7/10. 8. CAD: LHC (7/10) showed totally occluded proximal RCA with very robust left to right collaterals, 50% proximal LAD stenosis, EF 65%.  Medical management.   9. Active smoker.  10. Colonic polyps 11. h/o TIA 12. 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  This manifested with slurred speech. 13. History of low back pain.  The patient is status post surgical back fusion. 14. History of cholecystectomy.  15. Osteoarthritis  Past Surgical History: Last updated: 05/23/2009 S/P L4-5 discectomy in 4/98 by DrNudelman S/P lumbar decompression and Ray cage at L4-5 in 8/98 by [facility] S/P lumbar laminotomy & microdiscectomy at L3-4 by DrNudelman in 9/04 S/P cholecystectomy 1994  Family History: Last updated: 05/20/2008 Father alive age 7 w/ CAD & CABG at [facility] in 07-14-2007 Mother died age 29 w/ Alzheimer's disease, hx of DJD 1 Sibling- Brother alive & well w/ DJD in his hips  Social History: Last updated: 10/27/2009 Married- 3rd marriage, wife= Etta 2 children from 1st marriage, 4 step children Former smoker, 40+yrs of >1ppd, quit 7/10.    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  HYPERTENSION (ICD-401.9) - controlled on METOPROLOL 50mg Bid, AMLODIPINE 10mg /d, BENAZEPRIL 20mg /d...   AORTIC STENOSIS (ICD-424.1) - eval by DrMcLean for Cards 9/09... 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  ~  baseline EKG shows NSR, WNL...  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  ~  2DEcho 7/10 showed mod conc LVH, EF=55-60% & norm wall motion, mild AS, mild LA dil & lipomatous hypertrophy of septum.  ~  Myoview 7/10 showed mild infer ischemia, ST abn as well, EF=69% w/ norm wall motion.  ~  Cath 7/10 showed occluded RCA w/ collaterals from distal LAD, sm CIRC w/ 40% stenosis, LAD w/ 50%prox stenosis...  HYPERLIPIDEMIA (ICD-272.4) - on LIPITOR 40mg - 1/2 tab daily + Fish Oil supplements...  ~  FLP 11/08 showed TChol 162, TG 153, HDL 30, LDL 101...needs better diet, may need incr dose...  ~  FLP 7/09 showed TChol 146, Tg 188, HDL 27, LDL 82  ~  FLP 4/10 showed TChol 144, TG 205, HDL 29, LDL 81  ~  FLP 10/10 showed TChol 133, TG 187, HDL 28, LDL 68  GERD (ICD-530.81) - on OMEPRAZOLE 20mg /d... last EGD was urgent procedure 12/07 by DrBrodie w/ meat impaction- 3cmHH, stricture- subseq dilated w/ Savary dilators and no trouble since then...  DIVERTICULOSIS OF COLON (ICD-562.10) - colonoscopy 7/00 by DrStark showed divertics, hems...   ~  last colonoscopy 9/09 by drStark showed divertics, hems, 5mm adenomatous colon polyp removed... f/u 9yrs.  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      Pulmonary History-continued: BENIGN PROSTATIC HYPERTROPHY, HX OF (ICD-V13.8) - eval by DrGrapey in 07-13-2004 w/ BPH, mild outlet symptoms, ED, and hematospermia...   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 he tells me he has been on disability due to his back- per [facility]... currently using DCN100 & RELAFEN 500mg Bid as needed for pain...  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 on ASA 325mg /d + NO Smoking + Chol Rx + HBP Rx etc... no known recurrence...  ANXIETY (ICD-300.00)  Health Maintenance - he has been a mod smoker and quits on & off... currently off x 6months and hopefully will remain off...  Social History: Married- 3rd marriage, wife= Etta 2 children from 1st marriage, 4 step children Former smoker, 40+yrs of >1ppd, quit 7/10.  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  would like to discuss something to help calm him and help to sleep Is Patient Diabetic? No Comments Medications reviewed with patient Daytime contact number verified with patient. Boone Master CNA/MA  October 27, 2009 11:00 AM    Physical Exam  Additional Exam:  WD, WN,  70 y/o WM in NAD... GENERAL:  Alert & oriented; pleasant & cooperative... HEENT:  [place]/AT,  EACs-clear, TMs-wnl, NOSE-clear, THROAT-clear & wnl. NECK:  Supple w/ fairROM; no JVD; normal carotid impulses w/o bruits; no thyromegaly or nodules palpated; no lymphadenopathy. CHEST:  Clear to P & A; without wheezes/ rales/ or rhonchi heard... HEART:  Regular Rhythm; gr 1-2 sys murmur at base without rubs or gallops detected... ABDOMEN:  Soft & nontender; normal bowel sounds; no organomegaly or masses palpated... EXT: without deformities, mild arthritic changes; no varicose veins/ +venous insuffic/ no edema. NEURO:  no focal deficits noted ; gait normal & balance OK.     Impression & Recommendations:  Problem # 1:  ANXIETY (ICD-300.00)  Stress reducers as we discussed.  I think marriage and personal counseling would be a great idea.  May use Xanx 0.25mg  1 by mouth two times a day as needed anxiety.  Low salt diet.  Exercise-increase as tolerated.  follow up Dr. Kriste Basque in 3 months.  Please contact office for sooner follow up if symptoms do not improve or worsen  His updated medication list for this problem includes:    Xanax 0.25 Mg Tabs (Alprazolam) .Marland Kitchen... 1 by mouth two times a day as needed anxiety  Orders: Est. Patient Level IV (11914)  Medications Added to Medication List This Visit: 1)  Imdur 30 Mg Xr24h-tab (Isosorbide mononitrate) .... Take 1 tab by mouth at bedtime 2)  Cholestyramine Powd (Cholestyramine) .... Take as directed when needed 3)  Xanax 0.25  Mg Tabs (Alprazolam) .Marland Kitchen.. 1 by mouth two times a day as needed anxiety  Complete Medication List: 1)  Aspirin 81 Mg Tbec (Aspirin) .... Take 1   tablet  by mouth daily 2)  Lopressor 50 Mg Tabs (Metoprolol tartrate) .... Take 1 tab by mouth two times a day... 3)  Amlodipine Besylate 10 Mg Tabs (Amlodipine besylate) .... Take 1 tablet by mouth once a day in the morning 4)  Benazepril Hcl 20 Mg Tabs (Benazepril hcl) .... Take 1 tablet  by mouth once a day in the evening 5)  Imdur 30 Mg Xr24h-tab (Isosorbide mononitrate) .... Take 1 tab by mouth at bedtime 6)  Nitroglycerin 0.4 Mg Subl (Nitroglycerin) .Marland Kitchen.. 1 under your tongue as needed for chest pain-may use every 5 minutes x 3 7)  Crestor 40 Mg Tabs (Rosuvastatin calcium) .... Take one-half  tablet daily 8)  Fish Oil 1000 Mg Caps (Omega-3 fatty acids) .... Take 2  capsules  by mouth two times a day 9)  Gnp Omeprazole 20 Mg Tbec (Omeprazole) .... Take 1 tablet by mouth once a day 10)  Imodium A-d 2 Mg Tabs (Loperamide hcl) .... Take 1 tablet by mouth once a day 11)  Cholestyramine Powd (Cholestyramine) .... Take as directed when needed 12)  Nabumetone 500 Mg Tabs (Nabumetone) .... Take one tablet by mouth two times a day as needed for arthritis pain... 13)  Vicodin 5-500 Mg Tabs (Hydrocodone-acetaminophen) .... Take one tablet by mouth three times a day as needed for pain 14)  Glucosamine Sulfate Powd (Glucosamine sulfate) .... 1/2 cap with water two times a day as needed 15)  Betamethasone Dipropionate 0.05 % Crea (Betamethasone dipropionate) .... Apply two times a day to psoriasis on knees as needed 16)  Tylenol Extra Strength 500 Mg Tabs (Acetaminophen) .... Take two tablets three times a day not to exceed 6 daily.  only take one tablet when vicodin is used 17)  Xanax 0.25 Mg Tabs (Alprazolam) .Marland Kitchen.. 1 by mouth two times a day as needed anxiety  Patient Instructions: 1)  Stress reducers as we discussed.  2)  I think marriage and personal counseling would be a great idea.  3)  May use Xanx 0.25mg  1 by mouth two times a day as needed anxiety.  4)  Low salt diet.  5)  Exercise-increase as tolerated.  6)  follow up Dr. Kriste Basque in 3 months.  7)  Please contact office for sooner follow up if symptoms do not improve or worsen  Prescriptions: XANAX 0.25 MG TABS (ALPRAZOLAM) 1 by mouth two times a day as needed anxiety  #60 x 0   Entered and Authorized by:   Rubye Oaks NP    Signed by:   Tammy Parrett NP on 10/27/2009   Method used:   Print then Give to Patient   RxID:   [id number]

## 2010-03-07 NOTE — Assessment & Plan Note (Signed)
Summary: 6 months/apc   Primary Care Provider:  Alroy Dust MD   CC:  6 month ROV & review of mult medical problems....  History of Present Illness: 70 y/o WM here for a follow up visit... he has multiple medical problems as noted below...    ~  May 20, 2008:  he has had a busy year, but no current complaints or concerns... he had routine CXR 7/09 w/ low lung vol but no infiltrates etc... routine labs 7/09 showed sl incr TG & low HDL, FBS= 115, otherw neg- he's been trying to diet...       ** he saw DrNudelman w/ incr back pain in Nov09 & Relafen 500mg Bid has helped... long hx LBP w/ L4-5 discectomy 2008 & subseq Ray cage fusion, & prev L3-4 discectomy 2004...      ** he had a f/u colonoscopy 9/09 by DrStark w/ divertics, hems, & 5mm adenomatous polyp removed... f/u planned 27yrs.      ** he saw DrMcLean for Cards 9/09 for AS- 2DEcho w/ 54fused cusps, the degree of AS is mild... f/u planned in 6yr... no CP, palpit, dizziness or syncope...   ~  November 23, 2008:  Cardiac eval 7/10 by DrMcLean for CP w/ cath showing occluded RCA w/ collaterals & non-obstructive dis in other vessels- med Rx w/ ASA, Imdur, Benazepril + incr Lipitor... he finally quit smoking as well!!!  Under incr stress w/ step daughter's illness...   ~  May 23, 2009:  generally stable w/ CC= arthritis pain... he saw DrMcLean 12/10 for f/u CAD, HBP, Chol, still smoking> continue aggressive med management, changed Lip20 to Crestor40- only taking 1/2 tab daily...  BP controlled on meds;  AS stable & asymptomatic;  FLP on the Cres20 looks better but HDL too low- rec incr exercise;  other issues as noted...   Current Problem List:  ALLERGIC RHINITIS (ICD-477.9) - he uses OTC antihistamines and nasal saline spray...  HYPERTENSION (ICD-401.9) - controlled on METOPROLOL 50mg Bid, AMLODIPINE 10mg /d, BENAZEPRIL 20mg /d... BP= 116/62 today & OK per pt at home... tolerates meds well and denies HA, fatigue, visual changes, CP, palipit,  dizziness, syncope, dyspnea, edema, etc...  CAD, NATIVE VESSEL (ICD-414.01) - followed by DrMcLean for Cards... on ASA 81mg /d, + IMDUR 30mg /d...  ~  Myoview 7/10 showed mild infer ischemia, ST abn as well, EF=69% w/ norm wall motion.  ~  Cath 7/10 showed total occlusion of prox RCA, w/ left to right collaterals (from distal LAD); 50% LAD stenosis & small CIRC w/ 40% lesion- MedRx per Cards.  AORTIC STENOSIS (ICD-424.1) - eval by DrMcLean for Cards 9/09... 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  ~  baseline EKG shows NSR, WNL...  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  ~  2DEcho 7/10 showed mod conc LVH, EF=55-60% & norm wall motion, mild AS, mild LA dil & lipomatous hypertrophy of septum.  HYPERLIPIDEMIA (ICD-272.4) - on LIPITOR 40mg - 1/2 tab daily + Fish Oil supplements...  ~  FLP 11/08 showed TChol 162, TG 153, HDL 30, LDL 101...needs better diet, may need incr dose...  ~  FLP 7/09 showed TChol 146, Tg 188, HDL 27, LDL 82  ~  FLP 4/10 showed TChol 144, TG 205, HDL 29, LDL 81  ~  FLP 10/10 showed TChol 133, TG 187, HDL 28, LDL 68... changed to Crestor40- 1/2 tab daily by Cards.  ~  FLP 4/11 showed TChol 115,  TG 181, HDL 26, LDL 53  GERD (ICD-530.81) - on OMEPRAZOLE 20mg /d... last EGD was urgent procedure 12/07 by DrBrodie w/ meat impaction- 3cmHH, stricture- subseq dilated w/ Savary dilators and no trouble since then...  DIVERTICULOSIS OF COLON (ICD-562.10) - colonoscopy 7/00 by DrStark showed divertics, hems...   ~  last colonoscopy 9/09 by drStark showed divertics, hems, 5mm adenomatous colon polyp removed... f/u 80yrs.  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  BENIGN PROSTATIC HYPERTROPHY, HX OF (ICD-V13.8) - eval by DrGrapey in 07-03-04 w/ BPH, mild outlet symptoms, ED, and hematospermia...   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 he tells me he has been on disability due to his back- per [facility]... currently using VICODIN (up to 2 per day) & RELAFEN 500mg Bid as needed for pain...  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 on ASA 325mg /d + NO Smoking + Chol Rx + HBP Rx etc... no known recurrence...  ANXIETY (ICD-300.00)  Health Maintenance - he has been a mod smoker and quits on & off... currently off x 6months and hopefully will remain off...   Allergies: 1)  ! Celebrex  Comments:  Nurse/Medical Assistant: The patient's medications and allergies were reviewed with the patient and were updated in the Medication and Allergy Lists.  Past History:  Past Medical History: 1. Hyperlipidemia 2. Minimal aortic stenosis.  Echocardiogram in August 2009 showed EF 60%, no regional wall motion abnormalities, mild LVH, moderate focal basal septal hypertrophy, and mild diastolic dysfunction.  There was a very mild aortic stenosis with partially fused left and right coronary cusps and some restricted motion of the aortic valve.  The mean gradient, however, across the aortic valve was only 8 mmHg.  There was also mild left atrial enlargement and normal RV size and function.  Minimal AS on LHC in 7/10.  3. Hypertension. 4. Gastroesophageal reflux disease. 5. BPH. 6. Diverticulosis. 7. Smoked until 7/10. 8. CAD: LHC (7/10) showed totally occluded proximal RCA with very robust left to right collaterals, 50% proximal LAD stenosis, EF 65%.  Medical management.   9. Active smoker.  10. Colonic polyps 11. h/o TIA 12. 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   This manifested with slurred speech. 13. History of low back pain.  The patient is status post surgical back fusion. 14. History of cholecystectomy.   Past Surgical History: S/P L4-5 discectomy in 4/98 by DrNudelman S/P lumbar decompression and Ray cage at L4-5 in 8/98 by [facility] S/P lumbar laminotomy & microdiscectomy at L3-4 by DrNudelman in 9/04 S/P cholecystectomy 1994  Family History: Reviewed history from 05/20/2008 and no changes required. Father alive age 40 w/ CAD & CABG at [facility] in 2007-07-04 Mother died age 52 w/ Alzheimer's disease, hx of DJD 1 Sibling- Brother alive & well w/ DJD in his hips  Social History: Reviewed history from 01/21/2009 and no changes required. Married- 3rd marriage, wife= Veryl Speak 2 children from 1st marriage, 4 step children Former smoker, 40+yrs of >  1ppd, quit 7/10.  social alcohol retired on disability due to back former [employer] for hobby  Review of Systems      See HPI       The patient complains of dyspnea on exertion.  The patient denies anorexia, fever, weight loss, weight gain, vision loss, decreased hearing, hoarseness, chest pain, syncope, peripheral edema, prolonged cough, headaches, hemoptysis, abdominal pain, melena, hematochezia, severe indigestion/heartburn, hematuria, incontinence, muscle weakness, suspicious skin lesions, transient blindness, difficulty walking, depression, unusual weight change, abnormal bleeding, enlarged lymph nodes, and angioedema.    Vital Signs:  Patient profile:   70 year old male Height:      70 inches Weight:      192.25 pounds O2 Sat:      97 % on Room air Temp:     96.8 degrees F oral Pulse rate:   73 / minute BP sitting:   116 / 62  (right arm) Cuff size:   regular  Vitals Entered By: Randell Loop CMA (May 23, 2009 11:01 AM)  O2 Sat at Rest %:  97 O2 Flow:  Room air CC: 6 month ROV & review of mult medical problems... Is Patient Diabetic? No Pain  Assessment Patient in pain? yes      Onset of pain  increasd pain from arthritis Comments meds updated today   Physical Exam  Additional Exam:  WD, WN, 70 y/o WM in NAD... GENERAL:  Alert & oriented; pleasant & cooperative... HEENT:  [place]/AT, EOM-wnl, PERRLA, EACs-clear, TMs-wnl, NOSE-clear, THROAT-clear & wnl. NECK:  Supple w/ fairROM; no JVD; normal carotid impulses w/o bruits; no thyromegaly or nodules palpated; no lymphadenopathy. CHEST:  Clear to P & A; without wheezes/ rales/ or rhonchi heard... HEART:  Regular Rhythm; gr 1-2 sys murmur at base without rubs or gallops detected... ABDOMEN:  Soft & nontender; normal bowel sounds; no organomegaly or masses palpated... EXT: without deformities, mild arthritic changes; no varicose veins/ +venous insuffic/ no edema. NEURO:  CN's intact; motor testing normal; sensory testing normal; gait normal & balance OK. DERM:  No lesions noted; no rash etc...    MISC. Report  Procedure date:  05/25/2009  Findings:      BMP (METABOL)   Sodium                    143 mEq/L                   135-145   Potassium                 4.0 mEq/L                   3.5-5.1   Chloride                  108 mEq/L                   96-112   Carbon Dioxide            27 mEq/L                    19-32   Glucose              [H]  107 mg/dL                   16-10   BUN  10 mg/dL                    7-82   Creatinine                0.8 mg/dL                   9.5-6.2   Calcium                   9.0 mg/dL                   1.3-08.6   GFR                       102.60 mL/min               >60  Lipid Panel (LIPID)   Cholesterol               115 mg/dL                   5-784   Triglycerides        [H]  181.0 mg/dL                 6.9-629.5   HDL                  [L]  28.41 mg/dL                 >32.44   LDL Cholesterol           53 mg/dL                    0-10  CBC Platelet w/Diff (CBCD)   White Cell Count          7.9 K/uL                     4.5-10.5   Red Cell Count            4.64 Mil/uL                 4.22-5.81   Hemoglobin                13.7 g/dL                   27.2-53.6   Hematocrit                40.3 %                      39.0-52.0   MCV                       86.9 fl                     78.0-100.0   Platelet Count            167.0 K/uL                  150.0-400.0   Neutrophil %              67.1 %                      43.0-77.0   Lymphocyte %              23.1 %  12.0-46.0   Monocyte %                5.7 %                       3.0-12.0   Eosinophils%              3.7 %                       0.0-5.0   Basophils %               0.4 %                       0.0-3.0  Comments:      Hepatic/Liver Function Panel (HEPATIC)   Total Bilirubin           0.6 mg/dL                   1.6-1.0   Direct Bilirubin          0.1 mg/dL                   9.6-0.4   Alkaline Phosphatase      76 U/L                      39-117   AST                       21 U/L                      0-37   ALT                       20 U/L                      0-53   Total Protein             6.3 g/dL                    5.4-0.9   Albumin                   3.9 g/dL                    8.1-1.9  TSH (TSH)   FastTSH                   0.85 uIU/mL                 0.35-5.50  Prostate Specific Antigen (PSA)   PSA-Hyb                   2.34 ng/mL                  0.10-4.00   Impression & Recommendations:  Problem # 1:  HYPERTENSION (ICD-401.9) BP controlled on meds-  continue same. His updated medication list for this problem includes:    Lopressor 50 Mg Tabs (Metoprolol tartrate) .Marland Kitchen... Take 1 tab by mouth two times a day...    Amlodipine Besylate 10 Mg Tabs (Amlodipine besylate) .Marland Kitchen... Take 1 tablet by mouth once a day    Benazepril Hcl 20 Mg Tabs (Benazepril hcl) .Marland Kitchen... Take 1 tablet by mouth once a day  Problem # 2:  CAD,  NATIVE VESSEL (ICD-414.01) Followed by drMcLean for Cards-  stable w/o angina... continue med Rx. His updated  medication list for this problem includes:    Aspirin 81 Mg Tbec (Aspirin) .Marland Kitchen... Take 1   tablet  by mouth daily    Lopressor 50 Mg Tabs (Metoprolol tartrate) .Marland Kitchen... Take 1 tab by mouth two times a day...    Amlodipine Besylate 10 Mg Tabs (Amlodipine besylate) .Marland Kitchen... Take 1 tablet by mouth once a day    Benazepril Hcl 20 Mg Tabs (Benazepril hcl) .Marland Kitchen... Take 1 tablet by mouth once a day    Imdur 30 Mg Xr24h-tab (Isosorbide mononitrate) .Marland Kitchen... Take one tablet once daily    Nitroglycerin 0.4 Mg Subl (Nitroglycerin) .Marland Kitchen... 1 under your tongue as needed for chest pain-may use every 5 minutes x 3  Problem # 3:  AORTIC STENOSIS (ICD-424.1) Stable-  followed by DrMcLean. His updated medication list for this problem includes:    Aspirin 81 Mg Tbec (Aspirin) .Marland Kitchen... Take 1   tablet  by mouth daily    Lopressor 50 Mg Tabs (Metoprolol tartrate) .Marland Kitchen... Take 1 tab by mouth two times a day...  Problem # 4:  HYPERLIPIDEMIA (ICD-272.4) Improved on Crestor20... needs to incr exercise, better low fat diet. His updated medication list for this problem includes:    Crestor 40 Mg Tabs (Rosuvastatin calcium) .Marland Kitchen... Take one-half  tablet daily  Problem # 5:  GERD (ICD-530.81) GI is stable-  same meds. His updated medication list for this problem includes:    Gnp Omeprazole 20 Mg Tbec (Omeprazole) .Marland Kitchen... Take 1 tablet by mouth once a day  Problem # 6:  BENIGN PROSTATIC HYPERTROPHY, HX OF (ICD-V13.8) PSA remains normal...  Problem # 7:  OTHER PROBLEMS AS NOTED>>>  Complete Medication List: 1)  Aspirin 81 Mg Tbec (Aspirin) .... Take 1   tablet  by mouth daily 2)  Lopressor 50 Mg Tabs (Metoprolol tartrate) .... Take 1 tab by mouth two times a day... 3)  Amlodipine Besylate 10 Mg Tabs (Amlodipine besylate) .... Take 1 tablet by mouth once a day 4)  Benazepril Hcl 20 Mg Tabs (Benazepril hcl) .... Take 1 tablet by mouth once a day 5)  Imdur 30 Mg Xr24h-tab (Isosorbide mononitrate) .... Take one tablet once daily 6)   Nitroglycerin 0.4 Mg Subl (Nitroglycerin) .Marland Kitchen.. 1 under your tongue as needed for chest pain-may use every 5 minutes x 3 7)  Crestor 40 Mg Tabs (Rosuvastatin calcium) .... Take one-half  tablet daily 8)  Fish Oil 1000 Mg Caps (Omega-3 fatty acids) .... Take 2  capsules  by mouth two times a day 9)  Gnp Omeprazole 20 Mg Tbec (Omeprazole) .... Take 1 tablet by mouth once a day 10)  Imodium A-d 2 Mg Tabs (Loperamide hcl) .... Take 1 tablet by mouth once a day 11)  Cholestyramine Powd (Cholestyramine) .... Take as directed... 12)  Nabumetone 500 Mg Tabs (Nabumetone) .... Take one tablet by mouth two times a day as needed for arthritis pain... 13)  Vicodin 5-500 Mg Tabs (Hydrocodone-acetaminophen) .... Take one tablet by mouth three times a day as needed for pain 14)  Glucosamine Sulfate Powd (Glucosamine sulfate) .... 1/2 cap with water two times a day as needed 15)  Betamethasone Dipropionate 0.05 % Crea (Betamethasone dipropionate) .... 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 2)  We refilled all your  meds per your request... 3)  Please return to our lab in the AM for your FASTING blood work... then please call the "phone tree" in a few days for your lab results.Marland KitchenMarland Kitchen 4)  Stay as active as possible... 5)  Please schedule a follow-up appointment in 6 months. Prescriptions: VICODIN 5-500 MG TABS (HYDROCODONE-ACETAMINOPHEN) take one tablet by mouth three times a day as needed for pain  #90 x 5   Entered and Authorized by:   Michele Mcalpine MD   Signed by:   Michele Mcalpine MD on 05/23/2009   Method used:   Print then Give to Patient   RxID:   [id number] NABUMETONE 500 MG TABS (NABUMETONE) take one tablet by mouth two times a day as needed for arthritis pain...  #60 x 5   Entered and Authorized by:   Michele Mcalpine MD   Signed by:   Michele Mcalpine MD on  05/23/2009   Method used:   Print then Give to Patient   RxID:   [id number] CHOLESTYRAMINE   POWD (CHOLESTYRAMINE) take as directed...  #1 container x prn   Entered and Authorized by:   Michele Mcalpine MD   Signed by:   Michele Mcalpine MD on 05/23/2009   Method used:   Print then Give to Patient   RxID:   [id number] GNP OMEPRAZOLE 20 MG  TBEC (OMEPRAZOLE) Take 1 tablet by mouth once a day  #30 x prn   Entered and Authorized by:   Michele Mcalpine MD   Signed by:   Michele Mcalpine MD on 05/23/2009   Method used:   Print then Give to Patient   RxID:   [id number] CRESTOR 40 MG TABS (ROSUVASTATIN CALCIUM) take one-half  tablet daily  #30 x prn   Entered and Authorized by:   Michele Mcalpine MD   Signed by:   Michele Mcalpine MD on 05/23/2009   Method used:   Print then Give to Patient   RxID:   [id number] IMDUR 30 MG XR24H-TAB (ISOSORBIDE MONONITRATE) take one tablet once daily  #30 x prn   Entered and Authorized by:   Michele Mcalpine MD   Signed by:   Michele Mcalpine MD on 05/23/2009   Method used:   Print then Give to Patient   RxID:   [id number] BENAZEPRIL HCL 20 MG TABS (BENAZEPRIL HCL) Take 1 tablet by mouth once a day  #30 x prn   Entered and Authorized by:   Michele Mcalpine MD   Signed by:   Michele Mcalpine MD on 05/23/2009   Method used:   Print then Give to Patient   RxID:   [id number] AMLODIPINE BESYLATE 10 MG TABS (AMLODIPINE BESYLATE) Take 1 tablet by mouth once a day  #30 x prn   Entered and Authorized by:   Michele Mcalpine MD   Signed by:   Michele Mcalpine MD on 05/23/2009   Method used:   Print then Give to Patient   RxID:   [id number] LOPRESSOR 50 MG  TABS (METOPROLOL TARTRATE) take 1 tab by mouth two times a day...  #60 x prn   Entered and Authorized by:   Michele Mcalpine MD   Signed by:   Michele Mcalpine MD on 05/23/2009   Method used:   Print then Give to Patient   RxID:   [id number]

## 2010-03-07 NOTE — Consult Note (Signed)
Summary: [facility]   Imported By: Lennie Odor 08/10/2009 16:26:17  _____________________________________________________________________  External Attachment:    Type:   Image     Comment:   External Document

## 2010-03-09 NOTE — Assessment & Plan Note (Signed)
Summary: Brandon [name]   Visit Type:  Follow-up Primary Provider:  Alroy Dust MD   CC:  no complaints.  History of Present Illness: 70 yo with history of CAD, HTN, and hyperlipidemia presents for followup.  Patient had described some exertional chest pain and myoview showed a partially reversible inferior defect, so LHC was done in 7/10.  This showed that his proximal RCA was totally occluded.  There were very robust left to right collaterals.  There was also a 50% proximal LAD stenosis.  The patient was managed medically.  Since that time, he has been doing well symptomatically.  He has had no further exertional chest pain.  He has good exercise tolerance without exertional dyspnea. He continues to have a lot of arthritis pain in his knees and hands.  He also feels like the muscles in his legs are getting weak.    Labs (12/10): HDL 28, LDL 70, K 4.3, creatinine 0.8 Labs (4/11): K 4, creatinine 0.8, LDL 53, HDL 26, LFTs normal Labs (04/54): LDL 74, HDL 27, K 4, creatinine 0.9, LFTs normal, TSH normal  ECG: NSR, normal  Current Medications (verified): 1)  Aspirin 81 Mg Tbec (Aspirin) .... Take 1   Tablet  By Mouth Daily 2)  Lopressor 50 Mg  Tabs (Metoprolol Tartrate) .... Take 1 Tab By Mouth Two Times A Day... 3)  Amlodipine Besylate 10 Mg Tabs (Amlodipine Besylate) .... Take 1 Tablet By Mouth Once A Day in The Morning 4)  Benazepril Hcl 20 Mg Tabs (Benazepril Hcl) .... Take 1 Tablet By Mouth Once A Day in The Evening 5)  Imdur 30 Mg Xr24h-Tab (Isosorbide Mononitrate) .... Take 1 Tab By Mouth At Bedtime 6)  Nitroglycerin 0.4 Mg Subl (Nitroglycerin) .Marland Kitchen.. 1 Under Your Tongue As Needed For Chest Pain-May Use Every 5 Minutes X 3 7)  Crestor 40 Mg Tabs (Rosuvastatin Calcium) .... Take One-Half  Tablet Daily 8)  Fish Oil 1000 Mg Caps (Omega-3 Fatty Acids) .... Take 2  Capsules  By Mouth Two Times A Day 9)  Gnp Omeprazole 20 Mg  Tbec (Omeprazole) .... Take 1 Tablet By Mouth Once A Day 10)  Imodium A-D 2 Mg   Tabs (Loperamide Hcl) .... Take 1 Tablet By Mouth Once A Day 11)  Nabumetone 500 Mg Tabs (Nabumetone) .... Take One Tablet By Mouth Two Times A Day As Needed For Arthritis Pain... 12)  Vicodin 5-500 Mg Tabs (Hydrocodone-Acetaminophen) .... Take One Tablet By Mouth Three Times A Day As Needed For Pain 13)  Glucosamine Sulfate   Powd (Glucosamine Sulfate) .... 1/2 Cap With Water Two Times A Day As Needed 14)  Betamethasone Dipropionate 0.05 % Crea (Betamethasone Dipropionate) .... Apply Two Times A Day To Psoriasis On Knees As Needed 15)  Xanax 0.25 Mg Tabs (Alprazolam) .Marland Kitchen.. 1 By Mouth Up To Three Times Daily As Needed For Nerves... 16)  Vitamin B-12 1000 Mcg Tabs (Cyanocobalamin) .... Take 1 Tab By Mouth Once Daily...  Allergies (verified): 1)  ! Celebrex  Past History:  Past Medical History: Reviewed history from 01/25/2010 and no changes required. 1. Hyperlipidemia 2. Minimal aortic stenosis.  Echocardiogram in August 2009 showed EF 60%, no regional wall motion abnormalities, mild LVH, moderate focal basal septal hypertrophy, and mild diastolic dysfunction.  There was a very mild aortic stenosis with partially fused left and right coronary cusps and some restricted motion of the aortic valve.  The mean gradient, however, across the aortic valve was only 8 mmHg.  There was also mild  left atrial enlargement and normal RV size and function.  Minimal AS on LHC in 7/10.  3. Hypertension. 4. Gastroesophageal reflux disease. 5. BPH. 6. Diverticulosis. 7. Smoked until 7/10. 8. CAD: LHC (7/10) showed totally occluded proximal RCA with very robust left to right collaterals, 50% proximal LAD stenosis, EF 65%.  Medical management.   9. Active smoker.  10. Colonic polyps 11. h/o TIA 12. 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  This manifested with slurred speech. 13. History of low back pain.  The patient is status post surgical back fusion. 14. History of cholecystectomy.  15.  Osteoarthritis  Family History: Reviewed history from 01/25/2010 and no changes required. Father alive age 53 w/ CAD & CABG at [facility] in 2007/07/14 Mother died age 62 w/ Alzheimer's disease, hx of DJD 1 Sibling- Brother alive & well w/ DJD in his hips  Social History: Reviewed history from 10/27/2009 and no changes required. Married- 3rd marriage, wife= Etta 2 children from 1st marriage, 4 step children Former smoker, 40+yrs of >1ppd, quit 7/10.  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 Mouth:  post nasal drip.   Neck:  Neck supple, no JVD. No masses, thyromegaly or abnormal cervical nodes. Lungs:  Clear bilaterally to auscultation and percussion. Heart:  Non-displaced PMI, chest non-tender; regular rate and rhythm, S1, S2 without  rubs or gallops.  2/6 systolic crescendo-decrescendo early peaking murmur at RUSB.  Carotid upstroke normal, no bruit.  Pedals normal pulses. No edema, no varicosities. Abdomen:  Bowel sounds positive; abdomen soft and non-tender without masses, organomegaly, or hernias noted. No hepatosplenomegaly. Extremities:  No clubbing or cyanosis. Neurologic:  Alert and oriented x 3. Psych:  Normal affect.   Impression & Recommendations:  Problem # 1:  CAD, NATIVE VESSEL (ICD-414.01) Stable with no ischemic symptoms.  Continue ASA, statin, beta blocker, ACEI.    Problem # 2:  AORTIC STENOSIS (ICD-424.1) Minimal stenosis on last echo.  Murmur is stable.   Problem # 3:  HYPERTENSION (ICD-401.9) BP is under good control.   Problem  # 4:  HYPERLIPIDEMIA (ICD-272.4) LDL close to goal (< 70) in 12/11.  HDL still low.  Patient's knee and hand pain is likely arthritis. He does have some muscle weakness and wonders if it can be due to the statin.  I would ideally like him to continue current statin but told him that if he is having a lot of muscle weakness problems, he can try stopping the statin for 1 week.  If he gets a lot better, I could transition him to pravastatin eventually (not ideal as this statin is not as potent).    Other Orders: EKG w/ Interpretation (93000)  Patient Instructions: 1)  Your physician recommends that you continue on your current medications as directed. Please refer to the Current Medication list given to you today. 2)  Your physician wants you to follow-up in:  6 months.  You will receive a reminder letter  in the mail two months in advance. If you don't receive a letter, please call our office to schedule the follow-up appointment.

## 2010-03-09 NOTE — Assessment & Plan Note (Signed)
Summary: 3 months/cb   Primary Care Provider:  Alroy Dust MD   CC:  8 month ROV & review of mult medical problems....  History of Present Illness: 70 y/o WM here for a follow up visit... he has multiple medical problems as noted below...    ~  November 23, 2008:  Cardiac eval 7/10 by DrMcLean for CP w/ cath showing occluded RCA w/ collaterals & non-obstructive dis in other vessels- med Rx w/ ASA, Imdur, Benazepril + incr Lipitor... he finally quit smoking as well!!!  Under incr stress w/ step daughter's illness...   ~  May 23, 2009:  generally stable w/ CC= arthritis pain... he saw DrMcLean 12/10 for f/u CAD, HBP, Chol > continue aggressive med management, changed Lip20 to Crestor40- only taking 1/2 tab daily...  BP controlled on meds;  AS stable & asymptomatic;  FLP on the Cres20 looks better but HDL too low- rec incr exercise;  other issues as noted...   ~  January 25, 2010:  8 mo ROV- his CC is arthritis pains & he saw Rheum- DrHawkes/ "I've got osteoarthritis" treated w/ Tylenol, Vicodin, & Voltaren gel;  +LBP w/ some leg numbness s/p surg... he requests Relafen which he received from [facility] in the past & really seemed to help- OK...  he had f/u DrMcLean 7/11- stable, no angina, continue same meds... labs from DrHawkes= all WNL x borderline low B12= 190 (180-914) and we discussed oral supplementation first w/ 1015mcg/d OTC & we will f/u labs on return...   Current Problem List:  ALLERGIC RHINITIS (ICD-477.9) - he uses OTC antihistamines and nasal saline spray...  HYPERTENSION (ICD-401.9) - controlled on METOPROLOL 50mg Bid, AMLODIPINE 10mg /d, BENAZEPRIL 20mg /d... BP= 136/74 today & OK per pt at home... tolerates meds well and denies HA, fatigue, visual changes, CP, palipit, dizziness, syncope, dyspnea, edema, etc...  CAD, NATIVE VESSEL (ICD-414.01) - followed by DrMcLean for Cards... on ASA 81mg /d, + IMDUR 30mg /d...  ~  Myoview 7/10 showed mild infer ischemia, ST abn as well, EF=69%  w/ norm wall motion.  ~  Cath 7/10 showed total occlusion of prox RCA, w/ left to right collaterals (from distal LAD); 50% LAD stenosis & small CIRC w/ 40% lesion- MedRx per Cards.  AORTIC STENOSIS (ICD-424.1) - eval by DrMcLean for Cards 9/09... 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  ~  baseline EKG shows NSR, WNL...  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  ~  2DEcho 7/10 showed mod conc LVH, EF=55-60% & norm wall motion, mild AS, mild LA dil & lipomatous hypertrophy of septum.  HYPERLIPIDEMIA (ICD-272.4) - on LIPITOR 40mg - 1/2 tab daily + Fish Oil supplements...  ~  FLP 11/08 showed TChol 162, TG 153, HDL 30, LDL 101...needs better diet, may need incr dose...  ~  FLP 7/09 showed TChol 146, Tg 188, HDL 27, LDL 82  ~  FLP 4/10 showed TChol 144, TG 205, HDL 29, LDL 81  ~  FLP 10/10 showed TChol 133, TG 187, HDL 28, LDL 68... changed to Crestor40- 1/2 tab daily by Cards.  ~  FLP 4/11 showed TChol 115, TG 181, HDL 26, LDL 53  ~  FLP 12/11 on Cres20 showed TChol 135, TG 169, HDL 27, LDL 74  GERD (ICD-530.81) - on OMEPRAZOLE 20mg /d... last EGD was urgent procedure 12/07 by DrBrodie w/ meat impaction- 3cmHH, stricture- subseq dilated w/ Savary dilators and no trouble since then...  DIVERTICULOSIS OF  COLON (ICD-562.10) - colonoscopy 7/00 by DrStark showed divertics, hems...   ~  last colonoscopy 9/09 by drStark showed divertics, hems, 5mm adenomatous colon polyp removed... f/u 61yrs.  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  BENIGN PROSTATIC HYPERTROPHY, HX OF (ICD-V13.8) - eval by DrGrapey in 06-26-04 w/ BPH, mild outlet symptoms, ED, and hematospermia...   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 he tells me he has been on disability due to his back- per [facility]... currently using VICODIN (up to 2 per day) & RELAFEN 500mg Bid as needed for pain...  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 on ASA 325mg /d + NO Smoking + Chol Rx + HBP Rx etc... no known recurrence...  ANXIETY (ICD-300.00) - on XANAX 0.25mg  Bid Prn... stress w/ wife & insomnia...  BORDERLINE B12 DEFICIENCY - B12 level 6/11 at [facility] office = 190 (180-914)... rec to start oral B12 supplementation w/ daily.  Health Maintenance - he has been a mod smoker and quits on & off... currently off x 6months and hopefully will remain off...   Preventive Screening-Counseling & Management  Alcohol-Tobacco     Smoking Status: quit     Year Quit: Feb09  Caffeine-Diet-Exercise     Does Patient Exercise: yes     Exercise (avg: min/session): 5:45  Allergies: 1)  ! Celebrex  Comments:  Nurse/Medical Assistant: The patient's medications and allergies were reviewed with the patient and were updated in the Medication and Allergy Lists.  Past History:  Past Medical History: 1. Hyperlipidemia 2. Minimal aortic stenosis.  Echocardiogram in August 2009 showed EF 60%, no regional wall motion abnormalities, mild LVH, moderate focal basal septal hypertrophy, and mild diastolic dysfunction.  There was a very mild aortic stenosis with partially fused left and right coronary cusps and some restricted motion of the aortic valve.  The mean gradient, however, across the aortic valve was only 8 mmHg.  There was also mild left atrial enlargement and normal RV size and function.  Minimal AS on LHC in 7/10.  3. Hypertension. 4. Gastroesophageal reflux disease. 5. BPH. 6. Diverticulosis. 7.  Smoked until 7/10. 8. CAD: LHC (7/10) showed totally occluded proximal RCA with very robust left to right collaterals, 50% proximal LAD stenosis, EF 65%.  Medical management.   9. Active smoker.  10. Colonic polyps 11. h/o TIA 12. 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  This manifested with slurred speech. 13. History of low back pain.  The patient is status post surgical back fusion. 14. History of cholecystectomy.  15. Osteoarthritis  Past Surgical History: S/P L4-5 discectomy in 4/98 by DrNudelman S/P lumbar decompression and Ray cage at L4-5 in 8/98 by [facility] S/P lumbar laminotomy & microdiscectomy at L3-4 by DrNudelman in 9/04 S/P cholecystectomy 1994  Family History: Reviewed history from 05/20/2008 and no changes required. Father alive age 75 w/ CAD & CABG at [facility] in 06-27-07 Mother died age 29 w/ Alzheimer's  disease, hx of DJD 1 Sibling- Brother alive & well w/ DJD in his hips  Social History: Reviewed history from 10/27/2009 and no changes required. Married- 3rd marriage, wife= Etta 2 children from 1st marriage, 4 step children Former smoker, 40+yrs of >1ppd, quit 7/10.  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  The patient denies anorexia, fever, weight loss, weight gain, vision loss, decreased hearing, hoarseness, chest pain, syncope, peripheral edema, prolonged cough, headaches, hemoptysis, abdominal pain, melena, hematochezia, severe indigestion/heartburn, hematuria, incontinence, muscle weakness, suspicious skin lesions, transient blindness, depression, unusual weight change, abnormal bleeding, enlarged lymph nodes, and angioedema.    Vital Signs:  Patient profile:   70 year old male Height:      70 inches Weight:      193 pounds BMI:     27.79 O2 Sat:       98 % on Room air Temp:     97.3 degrees F oral Pulse rate:   68 / minute BP sitting:   136 / 74  (left arm) Cuff size:   regular  Vitals Entered By: Randell Loop CMA (January 25, 2010 10:27 AM)  O2 Sat at Rest %:  98 O2 Flow:  Room air CC: 8 month ROV & review of mult medical problems... Is Patient Diabetic? No Pain Assessment Patient in pain? yes      Comments meds updated today with pt   Physical Exam  Additional Exam:  WD, WN, 69 y/o WM in NAD... GENERAL:  Alert & oriented; pleasant & cooperative... HEENT:  /AT, EOM-wnl, PERRLA, EACs-clear, TMs-wnl, NOSE-clear, THROAT-clear & wnl. NECK:  Supple w/ fairROM; no JVD; normal carotid impulses w/o bruits; no thyromegaly or nodules palpated; no lymphadenopathy. CHEST:  Clear to P & A; without wheezes/ rales/ or rhonchi heard... HEART:  Regular Rhythm; gr 1-2 sys murmur at base without rubs or gallops detected... ABDOMEN:  Soft & nontender; normal bowel sounds; no organomegaly or masses palpated... EXT: without deformities, mild arthritic changes; no varicose veins/ +venous insuffic/ no edema. NEURO:  CN's intact; motor testing normal; sensory testing normal; gait normal & balance OK. DERM:  No lesions noted; no rash etc...    MISC. Report  Procedure date:  02/01/2010  Findings:      Lipid Panel (LIPID)   Cholesterol               135 mg/dL                   1-610   Triglycerides        [H]  169.0 mg/dL                 9.6-045.4   HDL                  [L]  09.81 mg/dL                 >19.14   LDL Cholesterol           74 mg/dL                    7-82  BMP (METABOL)   Sodium                    140 mEq/L  135-145   Potassium                 4.0 mEq/L                   3.5-5.1   Chloride                  104 mEq/L                   96-112   Carbon Dioxide            29 mEq/L                    19-32   Glucose              [H]  108 mg/dL                   60-45   BUN                       11 mg/dL                     4-09   Creatinine                0.9 mg/dL                   8.1-1.9   Calcium                   9.4 mg/dL                   1.4-78.2   GFR                       95.46 mL/min                >60.00  Hepatic/Liver Function Panel (HEPATIC)   Total Bilirubin           0.8 mg/dL                   9.5-6.2   Direct Bilirubin          0.1 mg/dL                   1.3-0.8   Alkaline Phosphatase      78 U/L                      39-117   AST                       19 U/L                      0-37   ALT                       18 U/L                      0-53   Total Protein             6.7 g/dL                    6.5-7.8   Albumin                   3.9 g/dL  3.5-5.2  Comments:      CBC Platelet w/Diff (CBCD)   White Cell Count          7.4 K/uL                    4.5-10.5   Red Cell Count            4.96 Mil/uL                 4.22-5.81   Hemoglobin                14.8 g/dL                   11.9-14.7   Hematocrit                43.1 %                      39.0-52.0   MCV                       86.8 fl                     78.0-100.0   Platelet Count            163.0 K/uL                  150.0-400.0   Neutrophil %              67.1 %                      43.0-77.0   Lymphocyte %              23.9 %                      12.0-46.0   Monocyte %                6.5 %                       3.0-12.0   Eosinophils%              2.1 %                       0.0-5.0   Basophils %               0.4 %                       0.0-3.0  TSH (TSH)   FastTSH                   0.91 uIU/mL                 0.35-5.50  Prostate Specific Antigen (PSA)   PSA-Hyb                   2.66 ng/mL                  0.10-4.00   Impression & Recommendations:  Problem # 1:  HYPERTENSION (ICD-401.9) BP controlled on meds>  continue same. His updated medication list for this problem includes:    Lopressor 50 Mg Tabs (Metoprolol tartrate) .Marland Kitchen... Take 1 tab by mouth two times a day...    Amlodipine  Besylate 10 Mg  Tabs (Amlodipine besylate) .Marland Kitchen... Take 1 tablet by mouth once a day in the morning    Benazepril Hcl 20 Mg Tabs (Benazepril hcl) .Marland Kitchen... Take 1 tablet by mouth once a day in the evening  Problem # 2:  CAD, NATIVE VESSEL (ICD-414.01) Followed by DrMcLean>  ASHD, AS, etc... continue same meds. His updated medication list for this problem includes:    Aspirin 81 Mg Tbec (Aspirin) .Marland Kitchen... Take 1   tablet  by mouth daily    Lopressor 50 Mg Tabs (Metoprolol tartrate) .Marland Kitchen... Take 1 tab by mouth two times a day...    Amlodipine Besylate 10 Mg Tabs (Amlodipine besylate) .Marland Kitchen... Take 1 tablet by mouth once a day in the morning    Benazepril Hcl 20 Mg Tabs (Benazepril hcl) .Marland Kitchen... Take 1 tablet by mouth once a day in the evening    Imdur 30 Mg Xr24h-tab (Isosorbide mononitrate) .Marland Kitchen... Take 1 tab by mouth at bedtime    Nitroglycerin 0.4 Mg Subl (Nitroglycerin) .Marland Kitchen... 1 under your tongue as needed for chest pain-may use every 5 minutes x 3  Problem # 3:  HYPERLIPIDEMIA (ICD-272.4) Stable on cres20, but needs better diet, get weight down... His updated medication list for this problem includes:    Crestor 40 Mg Tabs (Rosuvastatin calcium) .Marland Kitchen... Take one-half  tablet daily  Problem # 4:  GERD (ICD-530.81) GI is stable>  continue current meds... His updated medication list for this problem includes:    Gnp Omeprazole 20 Mg Tbec (Omeprazole) .Marland Kitchen... Take 1 tablet by mouth once a day  Problem # 5:  DEGENERATIVE JOINT DISEASE (ICD-715.90) Follow[facility] by [facility]... he requests Relafen refill> OK. The following medications were removed from the medication list:    Tylenol Extra Strength 500 Mg Tabs (Acetaminophen) .Marland Kitchen... Take two tablets three times a day not to exceed 6 daily.  only take one tablet when vicodin is used His updated medication list for this problem includes:    Aspirin 81 Mg Tbec (Aspirin) .Marland Kitchen... Take 1   tablet  by mouth daily    Nabumetone 500 Mg Tabs (Nabumetone) .Marland Kitchen... Take one  tablet by mouth two times a day as needed for arthritis pain...    Vicodin 5-500 Mg Tabs (Hydrocodone-acetaminophen) .Marland Kitchen... Take one tablet by mouth three times a day as needed for pain  Problem # 6:  ANXIETY (ICD-300.00) OK to use XANAX for sleep Qhs... His updated medication list for this problem includes:    Xanax 0.25 Mg Tabs (Alprazolam) .Marland Kitchen... 1 by mouth up to three times daily as needed for nerves...  Problem # 7:  B12 DEFICIENCY>>> We discussed starting oral B12 105mcg/d...  Complete Medication List: 1)  Aspirin 81 Mg Tbec (Aspirin) .... Take 1   tablet  by mouth daily 2)  Lopressor 50 Mg Tabs (Metoprolol tartrate) .... Take 1 tab by mouth two times a day... 3)  Amlodipine Besylate 10 Mg Tabs (Amlodipine besylate) .... Take 1 tablet by mouth once a day in the morning 4)  Benazepril Hcl 20 Mg Tabs (Benazepril hcl) .... Take 1 tablet by mouth once a day in the evening 5)  Imdur 30 Mg Xr24h-tab (Isosorbide mononitrate) .... Take 1 tab by mouth at bedtime 6)  Nitroglycerin 0.4 Mg Subl (Nitroglycerin) .Marland Kitchen.. 1 under your tongue as needed for chest pain-may use every 5 minutes x 3 7)  Crestor 40 Mg Tabs (Rosuvastatin calcium) .... Take one-half  tablet daily 8)  Fish Oil 1000 Mg Caps (Omega-3 fatty acids) .... Take  2  capsules  by mouth two times a day 9)  Gnp Omeprazole 20 Mg Tbec (Omeprazole) .... Take 1 tablet by mouth once a day 10)  Imodium A-d 2 Mg Tabs (Loperamide hcl) .... Take 1 tablet by mouth once a day 11)  Nabumetone 500 Mg Tabs (Nabumetone) .... Take one tablet by mouth two times a day as needed for arthritis pain... 12)  Vicodin 5-500 Mg Tabs (Hydrocodone-acetaminophen) .... Take one tablet by mouth three times a day as needed for pain 13)  Glucosamine Sulfate Powd (Glucosamine sulfate) .... 1/2 cap with water two times a day as needed 14)  Betamethasone Dipropionate 0.05 % Crea (Betamethasone dipropionate) .... Apply two times a day to psoriasis on knees as needed 15)  Xanax  0.25 Mg Tabs (Alprazolam) .Marland Kitchen.. 1 by mouth up to three times daily as needed for nerves... 16)  Vitamin B-12 1000 Mcg Tabs (Cyanocobalamin) .... Take 1 tab by mouth once daily...  Patient Instructions: 1)  Today we updated your med list- see below.... 2)  We refilled your meds per request... 3)  Be sure to start the Vit B 12 tablet OTC- daily.Marland KitchenMarland Kitchen 4)  Please return to our lab one morning next week for your FASTING blood work... please call the "phone tree" in a few days for your lab results.Marland KitchenMarland Kitchen 5)  Let's get on track w/ our diet & exercise program (it's a great stress reliever)... 6)  Call for any problems.Marland KitchenMarland Kitchen 7)  Please schedule a follow-up appointment in 6 months. Prescriptions: XANAX 0.25 MG TABS (ALPRAZOLAM) 1 by mouth up to three times daily as needed for nerves...  #90 x 6   Entered and Authorized by:   Michele Mcalpine MD   Signed by:   Michele Mcalpine MD on 01/25/2010   Method used:   Print then Give to Patient   RxID:   [id number] NABUMETONE 500 MG TABS (NABUMETONE) take one tablet by mouth two times a day as needed for arthritis pain...  #60 x 6   Entered and Authorized by:   Michele Mcalpine MD   Signed by:   Michele Mcalpine MD on 01/25/2010   Method used:   Print then Give to Patient   RxID:   [id number]

## 2010-03-09 NOTE — Progress Notes (Signed)
Summary: PT WANTS TO DISCUSS HIS MEDICATIONS/lmtcb./cy  Phone Note Call from Patient Call back at [phone]   Caller: Patient Reason for Call: Talk to Nurse, Talk to Doctor Summary of Call: PT IS ON CRESTOR AND IT'S CAUSING HIM A LOT OF PAIN BUT HE STOPPED FOR A WEEK AND HE WAS FINE SO HE WENT BACK ON IT THE LAST COUPLE OF DAYS AND HE IS IN PAIN AGAIN SO HE NEEDS TO DISCUSS THE CHANGE IN HIS MEDS LIPITOR DIDN'T CAUSE ANY PAIN Initial call taken by: Omer Jack,  March 03, 2010 9:29 AM  Follow-up for Phone Call        lmtcb Scherrie Bateman, LPN  March 03, 2010 9:34 AM SPOKE WITH PT PAIN STOPPED ONCE CRESTOR WAS HELD PT TRIED RESTARTING AND PAIN RESUMED AFTER 3 DAYS OF TAKING MED. PER DR MCLEANS'S OFFICE NOTE  PRAVASTATIN TO BE STARTED AT  80 MG 1 at bedtime  DISCUSSED DOSAGE WITH [facility].REPEAT LIPID LIVER SET UP FOR 2 MONTHS  Follow-up by: Scherrie Bateman, LPN,  March 03, 2010 9:51 AM    New/Updated Medications: PRAVASTATIN SODIUM 80 MG TABS (PRAVASTATIN SODIUM) 1 once daily Prescriptions: PRAVASTATIN SODIUM 80 MG TABS (PRAVASTATIN SODIUM) 1 once daily  #30 x 11   Entered by:   Scherrie Bateman, LPN   Authorized by:   Marca Ancona, MD   Signed by:   Scherrie Bateman, LPN on 14/78/2956   Method used:   Electronically to        [facility] [address] [id number] (retail)       [address] [place], [place]  [address]       Ph: [phone]       Fax: [phone]   RxID:   [id number]   Appended Document: PT WANTS TO DISCUSS HIS MEDICATIONS/lmtcb./cy ok, agree with pravastatin trial

## 2010-05-02 ENCOUNTER — Other Ambulatory Visit: Payer: Self-pay | Admitting: *Deleted

## 2010-06-05 ENCOUNTER — Other Ambulatory Visit: Payer: Self-pay | Admitting: Pulmonary Disease

## 2010-06-06 ENCOUNTER — Other Ambulatory Visit: Payer: Self-pay | Admitting: *Deleted

## 2010-06-06 MED ORDER — BENAZEPRIL HCL 20 MG PO TABS: 20.0000 mg | ORAL_TABLET | Freq: Every day | ORAL | Status: DC

## 2010-06-20 NOTE — Assessment & Plan Note (Signed)
[facility]                            CARDIOLOGY OFFICE NOTE   LOYD, MARHEFKA                         MRN:          [id number]  DATE:10/09/2007                            DOB:          04-Dec-1940    PRIMARY CARE PHYSICIAN:  Lonzo Cloud. Kriste Basque, MD.   HISTORY OF PRESENT ILLNESS:  This is a 70 year old with a history of  hypertension, hyperlipidemia, and past stroke who presents to Cardiology  Clinic for evaluation of aortic stenosis.  The patient was seen by his  primary care physician earlier in the summer, and a murmur was noted on  exam.  He did have an echocardiogram done in August which was  significant for an aortic valve with fused left and right coronary cusps  with restricted motion.  However, the degree of aortic stenosis was very  mild with a mean gradient of only 8 mmHg.  The patient states that he  does quite well in general.  He does not get short of breath with  exertion.  He is very active.  He works on [employer] and he does  a lot of yard work.  He pushes his lawn mower and does not get shortness  of breath with any of his typical activities or moderate exertion.  He  does not have orthopnea or PND.  He has never had an episode of  exertional chest pain.  He has had no syncope or presyncope.  He does  occasionally get mildly lightheaded if he stands up quickly after he has  taken his blood pressure medications, however that tends to resolve  quickly.  He does have a history of a stroke in the distant past.  However, he has had no stroke-like symptoms recently.   PAST MEDICAL HISTORY:  1. Hyperlipidemia.  2. Mild aortic stenosis.  Echocardiogram in August 2009 showed EF 60%,      no regional wall motion abnormalities, mild LVH, moderate focal      basal septal hypertrophy, and mild diastolic dysfunction.  There      was a very mild aortic stenosis with partially fused left and right      coronary cusps and some restricted  motion of the aortic valve.  The      mean gradient, however, across the aortic valve was only 8 mmHg.      There was also mild left atrial enlargement and normal RV size and      function.  3. Hypertension.  4. Gastroesophageal reflux disease.  5. BPH.  6. Diverticulosis.  7. 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  This      manifested with slurred speech.  8. History of low back pain.  The patient is status post surgical back      fusion.  9. History of cholecystectomy.   MEDICATIONS:  1. Bextra 20 mg daily.  2. Toprol-XL 50 mg daily.  3. Aspirin 81 mg daily.  4. Lipitor 20 mg daily.  5. Amlodipine/benazepril 10/20 mg daily.  6. Prilosec over the counter.   DATA:  EKG  today, normal sinus rhythm and was a normal EKG.  Most recent  labs in July 2009:  HDL 27, LDL 82, potassium 4.5, and creatinine 0.8.   SOCIAL HISTORY:  The patient was a prior smoker.  He quit in 2008 and  remains off cigarettes.  He is married.  He works restoring Public relations account executive.  He drinks minimal alcohol.  He does not use any illicit  drugs.   FAMILY HISTORY:  There is no family history of early coronary artery  disease.  The patient states his father did have a heart murmur all his  life and never had any problems with it.   REVIEW OF SYSTEMS:  Negative except as noted in the history of present  illness.   PHYSICAL EXAMINATION:  VITAL SIGNS:  Blood pressure 116/78, heart rate  59 and regular, weight is 190.  GENERAL:  No apparent distress.  This is a well-developed male.  HEENT:  Normal exam.  NECK:  No JVD.  Normal carotid upstrokes.  No thyromegaly or thyroid  nodule.  There is no carotid bruit.  LUNGS:  Clear to auscultation bilaterally with normal respiratory  excursion.  CARDIOVASCULAR:  Heart regular, S1 and S2.  There is no S3 and no S4.  There is a 2/6 early- to mid-peaking crescendo-decrescendo systolic  murmur at the right upper sternal border.  S2 is heard clearly.   Carotid  upstrokes are brisk.  ABDOMEN:  Soft and nontender.  No hepatosplenomegaly.  Normoactive bowel  sounds.  EXTREMITIES:  There is 1+ edema at the ankles bilaterally.  No clubbing  or cyanosis.  NEUROLOGIC:  Alert and oriented x3.  Normal affect.  SKIN:  Normal exam.  MUSCULOSKELETAL:  Normal exam.   ASSESSMENT AND PLAN:  This is a 70 year old with hypertension and  hyperlipidemia who presents for evaluation of mild aortic stenosis.  1. Aortic stenosis.  The patient has very mild aortic stenosis on his      echo.  His left and right coronary cusps are partially fused.      There is restricted motion to the valve; however, the mean gradient      is only 8 mmHg.  The patient has no symptoms referable to valvular      disease.  At this time, it would be reasonable to follow up the      patient in 1 year with a repeat echocardiogram to assess the rate      of progression of aortic stenosis.  He can follow up in the office      after the echo is done.  If the aortic stenosis is not progressing      significantly, then an echo done every 2 years or so would probably      be reasonable.  The patient will also need to keep his lipids quite      well controlled; as I see, they are currently with his LDL of 82.      We will continue him on Lipitor at 20 mg daily.  2. Hyperlipidemia.  The patient's LDL is under good control on 20 mg      of Lipitor.  His HDL is      quite low.  However, he apparently has not tolerated a trial of      Niaspan.  3. Hypertension.  Blood pressure is under good control today.     Marca Ancona, MD  Electronically Signed    DM/MedQ  DD: 10/09/2007  DT:  10/10/2007  Job #: [id number]   cc:   Lonzo Cloud. Kriste Basque, MD  Pricilla Riffle, MD, [facility]

## 2010-06-20 NOTE — Cardiovascular Report (Signed)
Brandon [name], Brandon [name] NO.:  [id number]   MEDICAL RECORD NO.:  [id number]          PATIENT TYPE:  OIB   LOCATION:  1962                         FACILITY:  [facility]   PHYSICIAN:  Marca Ancona, MD      DATE OF BIRTH:  Mar 04, 1940   DATE OF PROCEDURE:  DATE OF DISCHARGE:  09/01/2008                            CARDIAC CATHETERIZATION   PRIMARY CARE PHYSICIAN:  Lonzo Cloud. Kriste Basque, MD   PROCEDURE:  1. Left heart catheterization.  2. Coronary angiography.  3. Left ventriculography.   INDICATIONS:  Exertional chest pain in a patient with positive  functional study.   PROCEDURE NOTE:  Informed informed consent was obtained, the right groin  was sterilely prepped and draped.  Lidocaine 1% was used to locally  anesthetize the right groin area.  The right common femoral artery was  engaged using Seldinger technique and a 4-French arterial sheath was  placed.  The left coronary artery was engaged using the 4-French JL-4  catheter.  Right coronary artery was engaged using the 4-French 3-DRC  catheter and left ventricle was entered using the 4-French angled  pigtail catheter.  There were no complications.   FINDINGS:  1. Hemodynamics:  Aorta 129/73, LV 137/19.  2. Ventriculography:  EF was normal at 65%.  There were no wall motion      abnormalities.  There was no significant gradient across the aortic      valve on pullback  3. 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  There were quite robust collaterals from the      distal LAD to the RCA that cover essentially the entire RCA      territory.  4. Left main:  The left main coronary artery had no significant      disease.  5. Left circumflex system:  There was a moderate-sized ramus with      about 40% proximal stenosis.  The circumflex itself was a small      vessel with 2 small obtuse marginals.  There were luminal      irregularities.  6. LAD system:  There was a 50% proximal  LAD stenosis just after the      small to moderate sized first diagonal and just before a moderate-      sized septal perforator.  There was a 30% ostial stenosis in the      first diagonal and about a 50% ostial stenosis in the septal      perforator.  The remainder of the LAD had only luminal      irregularities and provided a very large collateral over to the      RCA.   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  There is  about a 50% proximal left anterior descending stenosis that will be  managed  medically for now.  We will follow him clinically.  He will need  to increase his Lipitor to 80 mg daily.  We will add Imdur 30 mg daily  and will have him follow up in the office in 2 weeks.      Marca Ancona, MD  Electronically Signed     DM/MEDQ  D:  09/01/2008  T:  09/02/2008  Job:  [id number]   cc:   Lonzo Cloud. Kriste Basque, MD

## 2010-07-13 ENCOUNTER — Other Ambulatory Visit: Payer: Self-pay | Admitting: Pulmonary Disease

## 2010-07-27 ENCOUNTER — Ambulatory Visit: Payer: Self-pay | Admitting: Pulmonary Disease

## 2010-08-07 ENCOUNTER — Telehealth: Payer: Self-pay | Admitting: Cardiology

## 2010-08-07 ENCOUNTER — Other Ambulatory Visit: Payer: Self-pay | Admitting: Pulmonary Disease

## 2010-08-07 DIAGNOSIS — E7800 Pure hypercholesterolemia, unspecified: Secondary | ICD-10-CM

## 2010-08-07 DIAGNOSIS — Z87438 Personal history of other diseases of male genital organs: Secondary | ICD-10-CM

## 2010-08-07 DIAGNOSIS — I2510 Atherosclerotic heart disease of native coronary artery without angina pectoris: Secondary | ICD-10-CM

## 2010-08-07 NOTE — Telephone Encounter (Signed)
Pt would like to have yearly labs drawn as well as PSA prior to appt with Dr. Shirlee Latch.  Pt is on Pravastatin 80mg  daily. Mylo Red RN

## 2010-08-07 NOTE — Telephone Encounter (Signed)
Pt would like to come and get a full blood panel done prior to appt this week or next week

## 2010-08-08 ENCOUNTER — Other Ambulatory Visit (INDEPENDENT_AMBULATORY_CARE_PROVIDER_SITE_OTHER): Payer: Medicare HMO | Admitting: *Deleted

## 2010-08-08 DIAGNOSIS — Z87438 Personal history of other diseases of male genital organs: Secondary | ICD-10-CM

## 2010-08-08 DIAGNOSIS — Z125 Encounter for screening for malignant neoplasm of prostate: Secondary | ICD-10-CM

## 2010-08-08 DIAGNOSIS — E7800 Pure hypercholesterolemia, unspecified: Secondary | ICD-10-CM

## 2010-08-08 DIAGNOSIS — I2510 Atherosclerotic heart disease of native coronary artery without angina pectoris: Secondary | ICD-10-CM

## 2010-08-08 LAB — CBC WITH DIFFERENTIAL/PLATELET
Eosinophils Absolute: 0.1 10*3/uL (ref 0.0–0.7)
MCHC: 34 g/dL (ref 30.0–36.0)
MCV: 88.6 fl (ref 78.0–100.0)
Monocytes Absolute: 0.5 10*3/uL (ref 0.1–1.0)
Neutrophils Relative %: 63.8 % (ref 43.0–77.0)
Platelets: 205 10*3/uL (ref 150.0–400.0)
WBC: 8.1 10*3/uL (ref 4.5–10.5)

## 2010-08-08 LAB — BASIC METABOLIC PANEL
GFR: 92.79 mL/min (ref >60.00)
Potassium: 4.1 mEq/L (ref 3.5–5.1)
Sodium: 141 mEq/L (ref 135–145)

## 2010-08-08 LAB — HEPATIC FUNCTION PANEL
ALT: 16 U/L (ref 0–53)
AST: 17 U/L (ref 0–37)
Bilirubin, Direct: 0.2 mg/dL (ref 0.0–0.3)
Total Bilirubin: 0.5 mg/dL (ref 0.3–1.2)

## 2010-08-08 LAB — LIPID PANEL: HDL: 33.3 mg/dL — ABNORMAL LOW (ref >39.00)

## 2010-08-08 LAB — PSA: PSA: 3.3 ng/mL (ref 0.10–4.00)

## 2010-08-19 ENCOUNTER — Other Ambulatory Visit: Payer: Self-pay | Admitting: Pulmonary Disease

## 2010-08-28 ENCOUNTER — Encounter: Payer: Self-pay | Admitting: Cardiology

## 2010-08-31 ENCOUNTER — Telehealth: Payer: Self-pay | Admitting: Cardiology

## 2010-09-01 ENCOUNTER — Encounter: Payer: Self-pay | Admitting: Cardiology

## 2010-09-01 ENCOUNTER — Ambulatory Visit (INDEPENDENT_AMBULATORY_CARE_PROVIDER_SITE_OTHER): Payer: Medicare HMO | Admitting: Cardiology

## 2010-09-01 VITALS — BP 102/68 | HR 66 | Ht 69.0 in | Wt 182.0 lb

## 2010-09-01 DIAGNOSIS — E785 Hyperlipidemia, unspecified: Secondary | ICD-10-CM

## 2010-09-01 DIAGNOSIS — I359 Nonrheumatic aortic valve disorder, unspecified: Secondary | ICD-10-CM

## 2010-09-01 DIAGNOSIS — I2510 Atherosclerotic heart disease of native coronary artery without angina pectoris: Secondary | ICD-10-CM

## 2010-09-01 DIAGNOSIS — I10 Essential (primary) hypertension: Secondary | ICD-10-CM

## 2010-09-01 MED ORDER — ATORVASTATIN CALCIUM 20 MG PO TABS: 20.0000 mg | ORAL_TABLET | Freq: Every day | ORAL | Status: DC

## 2010-09-01 MED ORDER — ATORVASTATIN CALCIUM 40 MG PO TABS: 40.0000 mg | ORAL_TABLET | Freq: Every day | ORAL | Status: DC

## 2010-09-01 MED ORDER — CO Q-10 200 MG PO CAPS: 1.0000 | ORAL_CAPSULE | Freq: Every day | ORAL | Status: DC

## 2010-09-01 NOTE — Patient Instructions (Signed)
Stop Pravastatin Start Atorvastatin 20 mg daily Start Co Q-10 200 mg daily  Your physician recommends that you return for a FASTING lipid and liver profile: 2 months (414.021, 272.0)  Your physician wants you to follow-up in: 6 months.  You will receive a reminder letter in the mail two months in advance. If you don't receive a letter, please call our office to schedule the follow-up appointment.

## 2010-09-02 NOTE — Assessment & Plan Note (Signed)
I think that patient's aches/pains are likely due to osteoarthritis in his hands and knees.  Neither stopping the statin nor changing from Crestor to pravastatin has made much difference.  LDL is above goal.  I am going to have him stop pravastatin and go on atorvastatin 20 mg daily with coenzyme Q10 200 mg daily.  Will get lipids/LFTs in 2 months.

## 2010-09-02 NOTE — Assessment & Plan Note (Signed)
Minimal stenosis on last echo.  Murmur is stable.

## 2010-09-02 NOTE — Assessment & Plan Note (Signed)
Blood pressure is under good control.  

## 2010-09-02 NOTE — Assessment & Plan Note (Signed)
Stable with no ischemic symptoms.  Continue ASA, statin, beta blocker, ACEI.

## 2010-09-02 NOTE — Progress Notes (Signed)
PCP: Dr. Kriste Basque  70 yo with history of CAD, HTN, and hyperlipidemia presents for followup.  Patient had described some exertional chest pain and myoview showed a partially reversible inferior defect, so LHC was done in 7/10.  This showed that his proximal RCA was totally occluded.  There were very robust left to right collaterals.  There was also a 50% proximal LAD stenosis.  The patient was managed medically.  Since that time, he has been doing well symptomatically.  He has had no further exertional chest pain.  He has good exercise tolerance without exertional dyspnea (mows, weedeats, woodworking). He continues to have a lot of arthritis pain in his knees and hands.  He has tried going off his statin and now is back on pravastatin.  Going off statin and then starting on pravastatin (had been on Crestor) has not made much difference with his aches and pains.    Labs (12/10): HDL 28, LDL 70, K 4.3, creatinine 0.8 Labs (4/11): K 4, creatinine 0.8, LDL 53, HDL 26, LFTs normal Labs (16/10): LDL 74, HDL 27, K 4, creatinine 0.9, LFTs normal, TSH normal Labs (7/12): LDL 87, HDL 33  Allergies (verified):  1)  ! Celebrex  Past Medical History: 1. Hyperlipidemia 2. Minimal aortic stenosis.  Echocardiogram in August 2009 showed EF 60%, no regional wall motion abnormalities, mild LVH, moderate focal basal septal hypertrophy, and mild diastolic dysfunction.  There was a very mild aortic stenosis with partially fused left and right coronary cusps and some restricted motion of the aortic valve.  The mean gradient, however, across the aortic valve was only 8 mmHg.  There was also mild left atrial enlargement and normal RV size and function.  Minimal AS on LHC in 7/10.  3. Hypertension. 4. Gastroesophageal reflux disease. 5. BPH. 6. Diverticulosis. 7. Smoked until 7/10. 8. CAD: LHC (7/10) showed totally occluded proximal RCA with very robust left to right collaterals, 50% proximal LAD stenosis, EF 65%.  Medical  management.   9. Osteoarthritis 10. Colonic polyps 11. h/o TIA 12. 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  This manifested with slurred speech. 13. History of low back pain.  The patient is status post surgical back fusion. 14. History of cholecystectomy.  Family History: Father alive age 53 w/ CAD & CABG at [facility] in 2007-07-09 Mother died age 25 w/ Alzheimer's disease, hx of DJD 1 Sibling- Brother alive & well w/ DJD in his hips  Social History: Married- 3rd marriage, wife= Etta 2 children from 1st marriage, 4 step children Former smoker, 40+yrs of >1ppd, quit 7/10.  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   Current Outpatient Prescriptions  Medication Sig Dispense Refill  . amLODipine (NORVASC) 10 MG tablet TAKE ONE TABLET BY MOUTH DAILY  30 tablet  0  . aspirin 81 MG EC tablet Take 81 mg by mouth daily.        . benazepril (LOTENSIN) 20 MG tablet Take 1 tablet (20 mg total) by mouth daily.  30 tablet  11  . betamethasone dipropionate (DIPROLENE) 0.05 % cream Apply topically 2 (two) times daily.        . Glucosamine Sulfate POWD by Does not apply route. 1/2 cup with water BID PRN       . HYDROcodone-acetaminophen (VICODIN) 5-500 MG per tablet TAKE ONE TABLET BY MOUTH THREE TIMES DAILY AS NEEDED  FOR PAIN  90 tablet  5  . isosorbide mononitrate (IMDUR) 30 MG 24 hr tablet Take 30 mg by mouth at bedtime.        Marland Kitchen loperamide (IMODIUM A-D) 2 MG tablet Take 2 mg by mouth daily.        . metoprolol (LOPRESSOR) 50 MG tablet TAKE 1 TABLET BY MOUTH TWICE DAILY  60 tablet  1  . nitroGLYCERIN (NITROSTAT) 0.4 MG SL tablet Place 0.4 mg under the tongue every 5 (five) minutes as needed.        . Omega-3 Fatty Acids (FISH OIL) 1000 MG CAPS Take 2 capsules by mouth 2 (two) times daily.        Marland Kitchen omeprazole (PRILOSEC) 20 MG capsule Take 20  mg by mouth daily.        . vitamin B-12 (CYANOCOBALAMIN) 1000 MCG tablet Take 1,000 mcg by mouth daily.        Marland Kitchen atorvastatin (LIPITOR) 20 MG tablet Take 1 tablet (20 mg total) by mouth daily.  30 tablet  6  . Coenzyme Q10 (CO Q-10) 200 MG CAPS Take 1 capsule by mouth daily.    0    BP 102/68  Pulse 66  Ht 5\' 9"  (1.753 m)  Wt 182 lb (82.555 kg)  BMI 26.88 kg/m2 General:  Well developed, well nourished, in no acute distress. Neck:  Neck supple, no JVD. No masses, thyromegaly or abnormal cervical nodes. Lungs:  Clear bilaterally to auscultation and percussion. Heart:  Non-displaced PMI, chest non-tender; regular rate and rhythm, S1, S2 without  rubs or gallops.  2/6 systolic crescendo-decrescendo early peaking murmur at RUSB.  Carotid upstroke normal, no bruit.  Pedals normal pulses. No edema, no varicosities. Abdomen:  Bowel sounds positive; abdomen soft and non-tender without masses, organomegaly, or hernias noted. No hepatosplenomegaly. Extremities:  No clubbing or cyanosis. Neurologic:  Alert and oriented x 3. Psych:  Normal affect.

## 2010-09-04 ENCOUNTER — Other Ambulatory Visit: Payer: Self-pay

## 2010-09-04 MED ORDER — ISOSORBIDE MONONITRATE ER 30 MG PO TB24: 30.0000 mg | ORAL_TABLET | Freq: Every day | ORAL | Status: DC

## 2010-09-04 NOTE — Telephone Encounter (Signed)
Refill on isosorbide mono 30 mg.

## 2010-09-05 NOTE — Telephone Encounter (Signed)
Per pt call, we had called in lipitor 20 mg, and we also called in isosorbide mononitrate and/or benasepril pt is already taking both of these from Dr. Kriste Basque. Pt wanted to inform nurse that he is taking this RX as prescribed from Dr. Kriste Basque. Pt would like to speak w/ nurse to discuss. Please return pt call.

## 2010-09-05 NOTE — Telephone Encounter (Signed)
I talked with pt. Pt was asking about taking coenzyme Q10 200mg  with atorvastatin.

## 2010-09-06 ENCOUNTER — Ambulatory Visit (INDEPENDENT_AMBULATORY_CARE_PROVIDER_SITE_OTHER): Payer: Medicare HMO | Admitting: Pulmonary Disease

## 2010-09-06 ENCOUNTER — Encounter: Payer: Self-pay | Admitting: Pulmonary Disease

## 2010-09-06 DIAGNOSIS — I10 Essential (primary) hypertension: Secondary | ICD-10-CM

## 2010-09-06 DIAGNOSIS — K219 Gastro-esophageal reflux disease without esophagitis: Secondary | ICD-10-CM

## 2010-09-06 DIAGNOSIS — K5730 Diverticulosis of large intestine without perforation or abscess without bleeding: Secondary | ICD-10-CM

## 2010-09-06 DIAGNOSIS — M1990 Unspecified osteoarthritis, unspecified site: Secondary | ICD-10-CM

## 2010-09-06 DIAGNOSIS — Z87898 Personal history of other specified conditions: Secondary | ICD-10-CM

## 2010-09-06 DIAGNOSIS — I2510 Atherosclerotic heart disease of native coronary artery without angina pectoris: Secondary | ICD-10-CM

## 2010-09-06 DIAGNOSIS — E785 Hyperlipidemia, unspecified: Secondary | ICD-10-CM

## 2010-09-06 DIAGNOSIS — F411 Generalized anxiety disorder: Secondary | ICD-10-CM

## 2010-09-06 DIAGNOSIS — I6350 Cerebral infarction due to unspecified occlusion or stenosis of unspecified cerebral artery: Secondary | ICD-10-CM

## 2010-09-06 DIAGNOSIS — I359 Nonrheumatic aortic valve disorder, unspecified: Secondary | ICD-10-CM

## 2010-09-06 NOTE — Patient Instructions (Signed)
Today we update your med list in EPIC...  Continue your current meds the same & follow DrMcLean's plan for your Cholesterol medication...  Call for any problems...  Let's plan another follow up in 6 months w/ FASTING blood work at that time.Marland KitchenMarland Kitchen

## 2010-09-06 NOTE — Progress Notes (Signed)
Subjective:    Patient ID: Brandon [name], male    DOB: 19-Mar-1940, 70 y.o.   MRN: [id number]  HPI 70 y/o WM here for a follow up visit... he has multiple medical problems as noted below...   ~  November 23, 2008:  Cardiac eval 7/10 by DrMcLean for CP w/ cath showing occluded RCA w/ collaterals & non-obstructive dis in other vessels- med Rx w/ ASA, Imdur, Benazepril + incr Lipitor... he finally quit smoking as well!!!  Under incr stress w/ step daughter's illness...  ~  May 23, 2009:  generally stable w/ CC= arthritis pain... he saw DrMcLean 12/10 for f/u CAD, HBP, Chol > continue aggressive med management, changed Lip20 to Crestor40- only taking 1/2 tab daily...  BP controlled on meds;  AS stable & asymptomatic;  FLP on the Cres20 looks better but HDL too low- rec incr exercise;  other issues as noted...  ~  January 25, 2010:  8 mo ROV- his CC is arthritis pains & he saw Rheum- DrHawkes/ "I've got osteoarthritis" treated w/ Tylenol, Vicodin, & Voltaren gel;  +LBP w/ some leg numbness s/p surg... he requests Relafen which he received from [facility] in the past & really seemed to help- OK...  he had f/u DrMcLean 7/11- stable, no angina, continue same meds... labs from DrHawkes= all WNL x borderline low B12= 190 (180-914) and we discussed oral supplementation first w/ 1083mcg/d OTC & we will f/u labs on return...  ~  September 06, 2010:  53mo ROV & he had labs done 08/08/10 & reviewed w/ the pt>  FLP looks ok on Prav40 but TG still too hi & LDL goal <70 so DrMcLean switched him once again to LIPITOR 20mg /d w CoQ10 & instructed on low carb, wt reducing, low fat diet...    He saw DrMcLean 7/12 for Cards f/u of his HBP, CAD, AS, & Chol> stable on ASA, BBlocker, ACE, & Statin; baseline EKG is NSR, WNL; aching & soreness not from Statin since neither stopping Statin & switching Cres20 to Prav40 has made a difference; asked to switch Prev40 to LIPITOR 20mg  w/ CoQ10 ~200mg /d and f/u FLP later...    He saw [facility]  3/12 for Rheum f/u of his OA> still c/o pain in all of his joints, takes Nabumetone as needed w/ relief, also notes less "achiness" since off the Crestor; she rec Tylenol more regularly & Voltaren Gel...   Current Problem List:  ALLERGIC RHINITIS (ICD-477.9) - he uses OTC antihistamines and nasal saline spray...  HYPERTENSION (ICD-401.9) - controlled on METOPROLOL 50mg Bid, AMLODIPINE 10mg /d, BENAZEPRIL 20mg /d... BP= 136/74 today & OK per pt at home... tolerates meds well and denies HA, fatigue, visual changes, CP, palipit, dizziness, syncope, dyspnea, edema, etc...  CAD, NATIVE VESSEL (ICD-414.01) - followed by DrMcLean for Cards... on ASA 81mg /d, + IMDUR 30mg /d... ~  Myoview 7/10 showed mild infer ischemia, ST abn as well, EF=69% w/ norm wall motion. ~  Cath 7/10 showed total occlusion of prox RCA, w/ left to right collaterals (from distal LAD); 50% LAD stenosis & small CIRC w/ 40% lesion- MedRx per Cards.  AORTIC STENOSIS (ICD-424.1) - eval by DrMcLean for Cards 9/09... 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 ~  baseline EKG shows NSR, WNL... 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 ~  2DEcho 7/10 showed mod conc LVH, EF=55-60% & norm wall motion, mild AS, mild LA dil & lipomatous hypertrophy of septum.  HYPERLIPIDEMIA (ICD-272.4) - on LIPITOR 40mg - 1/2 tab daily + Fish Oil supplements... DrMcLean has made freq adjustment to his meds. ~  FLP 11/08 showed TChol 162, TG 153, HDL 30, LDL 101...needs better diet, may need incr dose... ~  FLP 7/09 showed TChol 146, Tg 188, HDL 27, LDL 82 ~  FLP 4/10 showed TChol 144, TG 205, HDL 29, LDL 81 ~  FLP 10/10 showed TChol 133, TG 187, HDL 28, LDL 68... changed to Crestor40- 1/2 tab daily by Cards. ~  FLP 4/11 showed TChol 115, TG 181, HDL 26, LDL 53 ~  FLP 12/11 on Cres20 showed TChol 135, TG 169, HDL 27, LDL 74... Cres20 stopped due to aching in his joints...' ~  FLP 7/12 on Prav40 showed TChol 156, TG 180, HDL  33, LDL 87... DrMcLean changed him to Lip20 + CoQ10.  GERD (ICD-530.81) - on OMEPRAZOLE 20mg /d... last EGD was urgent procedure 12/07 by DrBrodie w/ meat impaction- 3cmHH, stricture- subseq dilated w/ Savary dilators and no trouble since then...  DIVERTICULOSIS OF COLON (ICD-562.10) - colonoscopy 7/00 by DrStark showed divertics, hems...  ~  last colonoscopy 9/09 by drStark showed divertics, hems, 5mm adenomatous colon polyp removed... f/u 60yrs.  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  BENIGN PROSTATIC HYPERTROPHY, HX OF (ICD-V13.8) - eval by DrGrapey in 2006 w/ BPH, mild outlet symptoms, ED, and hematospermia...  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 he tells me he has been on disability due to his back- per [facility]... currently using TYLENOL + VICODIN (up to 2 per day) & RELAFEN 500mg Bid as needed for pain... ~  3/12:  Rheum f/u DrHawks & she endorsed the use of Tylenol + Voltaren gel for his pain; he also takes Glucosamine & Chondroitin fo his joints.  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 on ASA 325mg /d + NO Smoking + Chol Rx + HBP Rx etc... no known recurrence...  ANXIETY (ICD-300.00) - prev on Xanax as needed for nerves, this med is no longer on his list (?uses wife's Rx)... stress w/ wife & insomnia...  BORDERLINE B12 DEFICIENCY - B12 level 6/11 at [facility] office = 190 ([phone])... rec to start oral B12 supplementation w/ daily.  Health Maintenance -  he has been a mod smoker and quits on & off... currently off x 6months and hopefully will remain off...   Past Surgical History  Procedure Date  . Cholecystectomy 1994  . L4-5 discectomy 4/98    Dr. Newell Coral   . Lumbar laminectomy 9/04    and microdiscectomy. L3-4. Dr. Newell Coral  . Lumbar decompression and ray cage 8/98    L4-5 Dr. Jule Ser    Outpatient Encounter Prescriptions as of 09/06/2010  Medication Sig Dispense Refill  . amLODipine (NORVASC) 10 MG tablet TAKE ONE TABLET BY MOUTH DAILY  30 tablet  0  . aspirin 81 MG EC tablet Take 81 mg by mouth daily.        Marland Kitchen  atorvastatin (LIPITOR) 20 MG tablet Take 1 tablet (20 mg total) by mouth daily.  30 tablet  6  . benazepril (LOTENSIN) 20 MG tablet Take 1 tablet (20 mg total) by mouth daily.  30 tablet  11  . betamethasone dipropionate (DIPROLENE) 0.05 % cream Apply topically 2 (two) times daily.        . Glucosamine-Chondroit-Vit C-Mn (GLUCOSAMINE 1500 COMPLEX) CAPS Take 2 capsules by mouth 2 (two) times daily.        Marland Kitchen HYDROcodone-acetaminophen (VICODIN) 5-500 MG per tablet TAKE ONE TABLET BY MOUTH THREE TIMES DAILY AS NEEDED FOR PAIN  90 tablet  5  . isosorbide mononitrate (IMDUR) 30 MG 24 hr tablet Take 1 tablet (30 mg total) by mouth at bedtime.  30 tablet  6  . loperamide (IMODIUM A-D) 2 MG tablet Take 2 mg by mouth daily.        . metoprolol (LOPRESSOR) 50 MG tablet TAKE 1 TABLET BY MOUTH TWICE DAILY  60 tablet  1  . nitroGLYCERIN (NITROSTAT) 0.4 MG SL tablet Place 0.4 mg under the tongue every 5 (five) minutes as needed.        . Omega-3 Fatty Acids (FISH OIL) 1000 MG CAPS Take 2 capsules by mouth 2 (two) times daily.        Marland Kitchen omeprazole (PRILOSEC) 20 MG capsule Take 20 mg by mouth daily.        . vitamin B-12 (CYANOCOBALAMIN) 1000 MCG tablet Take 1,000 mcg by mouth daily.        . Coenzyme Q10 (CO Q-10) 200 MG CAPS Take 1 capsule by mouth daily.    0  . DISCONTD: Glucosamine Sulfate POWD by Does not apply route. 1/2 cup with water  BID PRN         Allergies  Allergen Reactions  . Celecoxib     REACTION: hives and itching    Current Medications, Allergies, Past Medical History, Past Surgical History, Family History, and Social History were reviewed in [employer] record.    Review of Systems         See HPI - all other systems neg except as noted...  The patient complains of dyspnea on exertion and difficulty walking.  The patient denies anorexia, fever, weight loss, weight gain, vision loss, decreased hearing, hoarseness, chest pain, syncope, peripheral edema, prolonged cough, headaches, hemoptysis, abdominal pain, melena, hematochezia, severe indigestion/heartburn, hematuria, incontinence, muscle weakness, suspicious skin lesions, transient blindness, depression, unusual weight change, abnormal bleeding, enlarged lymph nodes, and angioedema.   Objective:   Physical Exam     WD, WN, 70 y/o WM in NAD... GENERAL:  Alert & oriented; pleasant & cooperative... HEENT:  [place]/AT, EOM-wnl, PERRLA, EACs-clear, TMs-wnl, NOSE-clear, THROAT-clear & wnl. NECK:  Supple w/ fairROM; no JVD; normal carotid impulses w/o bruits; no thyromegaly or nodules palpated; no lymphadenopathy. CHEST:  Clear to P & A; without wheezes/ rales/ or rhonchi heard... HEART:  Regular Rhythm; gr 1-2 sys murmur at base without rubs or gallops detected... ABDOMEN:  Soft & nontender; normal bowel sounds; no organomegaly or masses palpated... EXT: without deformities, mild arthritic changes; no varicose veins/ +venous insuffic/ no edema. NEURO:  CN's intact; motor testing normal; sensory testing normal; gait normal & balance OK. DERM:  No lesions noted; no rash etc...   Assessment & Plan:   HBP>  Controlled on Metoprolol, Amlodipine, Benazepril; continue same...  CAD>  Followed by DrMcLean & stable on ASA, BBlocker, ACE, Statin combo; no angina, needs incr activity &  diet efforts, get wt down!  AS>  Followed by DrMcLean,  murmur w/o change, no symptoms, continue observation...  HYPERLIPID>  Changed to Lipitor 20mg  + CoQ10 per Cards; encouraged to take regularly & get on low fat diet!!!  GERD>  Stable on Omep20mg /d...  Divertics, Polyps>  Up to date on colon screening from  DrStark...  BPH>  Recent PSA= 3.30 & we are following...  DJD>  Followed by [facility] & her notews are reviewed...  Hx Brainstem stroke>  Stable on ASA w/o any cerebral ischemic symptoms...  Other medical problems as noted.Marland KitchenMarland Kitchen

## 2010-09-11 ENCOUNTER — Encounter: Payer: Self-pay | Admitting: Pulmonary Disease

## 2010-09-15 ENCOUNTER — Other Ambulatory Visit: Payer: Self-pay | Admitting: Pulmonary Disease

## 2010-10-02 ENCOUNTER — Other Ambulatory Visit: Payer: Self-pay | Admitting: Pulmonary Disease

## 2010-10-03 ENCOUNTER — Other Ambulatory Visit: Payer: Self-pay | Admitting: *Deleted

## 2010-10-03 MED ORDER — METOPROLOL TARTRATE 50 MG PO TABS: 50.0000 mg | ORAL_TABLET | Freq: Two times a day (BID) | ORAL | Status: DC

## 2010-10-14 ENCOUNTER — Other Ambulatory Visit: Payer: Self-pay | Admitting: Pulmonary Disease

## 2010-10-16 ENCOUNTER — Ambulatory Visit (INDEPENDENT_AMBULATORY_CARE_PROVIDER_SITE_OTHER): Payer: Medicare HMO | Admitting: *Deleted

## 2010-10-16 DIAGNOSIS — E7800 Pure hypercholesterolemia, unspecified: Secondary | ICD-10-CM

## 2010-10-16 DIAGNOSIS — I2510 Atherosclerotic heart disease of native coronary artery without angina pectoris: Secondary | ICD-10-CM

## 2010-10-16 LAB — LIPID PANEL
LDL Cholesterol: 66 mg/dL (ref 0–99)
Total CHOL/HDL Ratio: 4
Triglycerides: 149 mg/dL (ref 0.0–149.0)

## 2010-10-16 LAB — HEPATIC FUNCTION PANEL
Albumin: 4 g/dL (ref 3.5–5.2)
Alkaline Phosphatase: 79 U/L (ref 39–117)

## 2010-11-16 ENCOUNTER — Other Ambulatory Visit: Payer: Self-pay | Admitting: Pulmonary Disease

## 2011-01-02 ENCOUNTER — Other Ambulatory Visit: Payer: Self-pay | Admitting: Neurosurgery

## 2011-01-02 DIAGNOSIS — M47817 Spondylosis without myelopathy or radiculopathy, lumbosacral region: Secondary | ICD-10-CM

## 2011-01-02 DIAGNOSIS — M5137 Other intervertebral disc degeneration, lumbosacral region: Secondary | ICD-10-CM

## 2011-01-02 DIAGNOSIS — M545 Low back pain, unspecified: Secondary | ICD-10-CM

## 2011-01-03 ENCOUNTER — Ambulatory Visit
Admission: RE | Admit: 2011-01-03 | Discharge: 2011-01-03 | Disposition: A | Discharge status: Home / Self Care | Payer: Medicare HMO | Source: Ambulatory Visit | Attending: Neurosurgery | Admitting: Neurosurgery

## 2011-01-03 DIAGNOSIS — M5137 Other intervertebral disc degeneration, lumbosacral region: Secondary | ICD-10-CM

## 2011-01-03 DIAGNOSIS — M47817 Spondylosis without myelopathy or radiculopathy, lumbosacral region: Secondary | ICD-10-CM

## 2011-01-03 DIAGNOSIS — M545 Low back pain, unspecified: Secondary | ICD-10-CM

## 2011-01-03 MED ORDER — GADOBENATE DIMEGLUMINE 529 MG/ML IV SOLN
17.0000 mL | Freq: Once | INTRAVENOUS | Status: AC | PRN
  Administered 2011-01-03: 17 mL via INTRAVENOUS

## 2011-03-06 ENCOUNTER — Ambulatory Visit (INDEPENDENT_AMBULATORY_CARE_PROVIDER_SITE_OTHER): Payer: Medicare Other | Admitting: Pulmonary Disease

## 2011-03-06 ENCOUNTER — Encounter: Payer: Self-pay | Admitting: Pulmonary Disease

## 2011-03-06 DIAGNOSIS — I6350 Cerebral infarction due to unspecified occlusion or stenosis of unspecified cerebral artery: Secondary | ICD-10-CM

## 2011-03-06 DIAGNOSIS — M1990 Unspecified osteoarthritis, unspecified site: Secondary | ICD-10-CM

## 2011-03-06 DIAGNOSIS — Z87898 Personal history of other specified conditions: Secondary | ICD-10-CM

## 2011-03-06 DIAGNOSIS — K5730 Diverticulosis of large intestine without perforation or abscess without bleeding: Secondary | ICD-10-CM

## 2011-03-06 DIAGNOSIS — F411 Generalized anxiety disorder: Secondary | ICD-10-CM

## 2011-03-06 DIAGNOSIS — I2510 Atherosclerotic heart disease of native coronary artery without angina pectoris: Secondary | ICD-10-CM

## 2011-03-06 DIAGNOSIS — E785 Hyperlipidemia, unspecified: Secondary | ICD-10-CM

## 2011-03-06 DIAGNOSIS — M545 Low back pain, unspecified: Secondary | ICD-10-CM

## 2011-03-06 DIAGNOSIS — K219 Gastro-esophageal reflux disease without esophagitis: Secondary | ICD-10-CM

## 2011-03-06 DIAGNOSIS — I359 Nonrheumatic aortic valve disorder, unspecified: Secondary | ICD-10-CM

## 2011-03-06 DIAGNOSIS — I10 Essential (primary) hypertension: Secondary | ICD-10-CM

## 2011-03-06 MED ORDER — ATORVASTATIN CALCIUM 20 MG PO TABS: 20.0000 mg | ORAL_TABLET | Freq: Every day | ORAL | Status: DC

## 2011-03-06 NOTE — Progress Notes (Signed)
Subjective:    Patient ID: Brandon [name], male    DOB: 07-20-1940, 71 y.o.   MRN: [id number]  HPI 71 y/o WM here for a follow up visit... he has multiple medical problems as noted below...   ~  November 23, 2008:  Cardiac eval 7/10 by DrMcLean for CP w/ cath showing occluded RCA w/ collaterals & non-obstructive dis in other vessels- med Rx w/ ASA, Imdur, Benazepril + incr Lipitor... he finally quit smoking as well!!!  Under incr stress w/ step daughter's illness...  ~  May 23, 2009:  generally stable w/ CC= arthritis pain... he saw DrMcLean 12/10 for f/u CAD, HBP, Chol > continue aggressive med management, changed Lip20 to Crestor40- only taking 1/2 tab daily...  BP controlled on meds;  AS stable & asymptomatic;  FLP on the Cres20 looks better but HDL too low- rec incr exercise;  other issues as noted...  ~  January 25, 2010:  8 mo ROV- his CC is arthritis pains & he saw Rheum- DrHawkes/ "I've got osteoarthritis" treated w/ Tylenol, Vicodin, & Voltaren gel;  +LBP w/ some leg numbness s/p surg... he requests Relafen which he received from [facility] in the past & really seemed to help- OK...  he had f/u DrMcLean 7/11- stable, no angina, continue same meds... labs from DrHawkes= all WNL x borderline low B12= 190 (180-914) and we discussed oral supplementation first w/ 1069mcg/d OTC & we will f/u labs on return...  ~  September 06, 2010:  11mo ROV & he had labs done 08/08/10 & reviewed w/ the pt>  FLP looks ok on Prav40 but TG still too hi & LDL goal <70 so DrMcLean switched him once again to LIPITOR 20mg /d w CoQ10 & instructed on low carb, wt reducing, low fat diet...    He saw DrMcLean 7/12 for Cards f/u of his HBP, CAD, AS, & Chol> stable on ASA, BBlocker, ACE, & Statin; baseline EKG is NSR, WNL; aching & soreness not from Statin since neither stopping Statin & switching Cres20 to Prav40 has made a difference; asked to switch Prev40 to LIPITOR 20mg  w/ CoQ10 ~200mg /d and f/u FLP later (done 9/12 & improved-  see below).    He saw [facility] 3/12 for Rheum f/u of his OA> still c/o pain in all of his joints, takes Nabumetone as needed w/ relief, also notes less "achiness" since off the Crestor; she rec Tylenol more regularly & Voltaren Gel...  ~  March 06, 2011:  30mo ROV & he's had a good 30mo he says; c/o nasal drainage, incr stress w/ [facility] for detached retina, some intermit dizziness, & his arthritis pain + LBP evaluated by DrNudelman "I'm eaten up by bone spurs & treat it w/ Relafen"... Requesting 30mo Rx of eligible meds for [name]...           Problem List:  ALLERGIC RHINITIS (ICD-477.9) - he uses OTC antihistamines and nasal saline spray as needed...  HYPERTENSION (ICD-401.9) - controlled on METOPROLOL 50mg Bid, AMLODIPINE 10mg /d, BENAZEPRIL 20mg /d...  ~  CXR 7/09 showed low lung vol, clear, norm heart size, NAD.Marland Kitchen. ~  8/12: BP= 136/74 today & OK per pt at home; tolerates meds well and denies HA, fatigue, visual changes, CP, palipit, dizziness, syncope, dyspnea, edema, etc... ~  1/13: BP= 118/68 & he remains asymptomatic w/o CP, palpit, SOB, edema, etc...  CAD, NATIVE VESSEL (ICD-414.01) - followed by DrMcLean for Cards... on ASA 81mg /d, + IMDUR 30mg /d... ~  Myoview 7/10 showed mild infer ischemia,  ST abn as well, EF=69% w/ norm wall motion. ~  Cath 7/10 showed total occlusion of prox RCA, w/ left to right collaterals (from distal LAD); 50% LAD stenosis & small CIRC w/ 40% lesion- MedRx per Cards. ~  He denies CP, palpit, dizzy, syncope, SOB, edema, etc; rec to incr exercise program... ~  EKG 7/12 showed NSR, rate66, WNL, NAD...  AORTIC STENOSIS (ICD-424.1) - eval by DrMcLean for Cards 9/09... 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 ~  baseline EKG shows NSR, WNL... 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 ~  2DEcho 7/10 showed mod conc LVH, EF=55-60% & norm wall motion, mild AS, mild LA dil & lipomatous hypertrophy of  septum.  HYPERLIPIDEMIA (ICD-272.4) - now on LIPITOR 40mg - 1/2 tab daily + Fish Oil supplements... DrMcLean has made freq adjustment to his meds. ~  FLP 11/08 showed TChol 162, TG 153, HDL 30, LDL 101...needs better diet, may need incr dose... ~  FLP 7/09 showed TChol 146, Tg 188, HDL 27, LDL 82 ~  FLP 4/10 showed TChol 144, TG 205, HDL 29, LDL 81 ~  FLP 10/10 showed TChol 133, TG 187, HDL 28, LDL 68... changed to Crestor40- 1/2 tab daily by Cards. ~  FLP 4/11 showed TChol 115, TG 181, HDL 26, LDL 53 ~  FLP 12/11 on Cres20 showed TChol 135, TG 169, HDL 27, LDL 74... Cres20 stopped due to aching in his joints... ~  FLP 7/12 on Prav40 showed TChol 156, TG 180, HDL 33, LDL 87... DrMcLean changed him to Lip20 + CoQ10. ~  FLP 9/12 on Lip20+CoQ10 showed TChol 129, TG 149, HDL 33, LDL 66... Continue Lip20.  GERD (ICD-530.81) - on OMEPRAZOLE 20mg /d... last EGD was urgent procedure 12/07 by DrBrodie w/ meat impaction- 3cmHH, stricture- subseq dilated w/ Savary dilators and no trouble since then...  DIVERTICULOSIS OF COLON (ICD-562.10) - colonoscopy 7/00 by DrStark showed divertics, hems...  ~  last colonoscopy 9/09 by drStark showed divertics, hems, 5mm adenomatous colon polyp removed... f/u 76yrs.  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  BENIGN PROSTATIC HYPERTROPHY, HX OF (ICD-V13.8) - eval by DrGrapey in 2006 w/ BPH, mild outlet symptoms, ED, and hematospermia...  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 he tells me he has been on disability due to his  back- per [facility]... currently using TYLENOL + VICODIN (up to 2 per day) & RELAFEN 500mg Bid as needed for pain... ~  3/12:  Rheum f/u DrHawks & she endorsed the use of Tylenol + Voltaren gel for his pain; he also takes Glucosamine & Chondroitin fo his joints. ~  11/12: Lumbar CT per DrNudelman> severe atherosclerosis & infrarenal AAA ~3cm; prev surg w/ ray cage fusion grafts, bulging discs, etc (see report). ~  11/12: MRI Lumbar spine per DrNudelman> bulging discs 7 scarring from prev surg...  Hx of BRAIN STEM STROKE (ICD-434.91) - hosp 10/00 w/ right face & arm symptoms and MRI showing sm  left pons infarct... on ASA 325mg /d + NO Smoking + Chol Rx + HBP Rx etc... no known recurrence...  ANXIETY (ICD-300.00) - prev on Xanax as needed for nerves, this med is no longer on his list (?uses wife's Rx)... stress w/ wife & insomnia...  BORDERLINE B12 DEFICIENCY - B12 level 6/11 at [facility] office = 190 (180-914)... rec to start oral B12 supplementation w/ daily.  Health Maintenance - he has been a mod smoker and quits on & off... currently off x 6months and hopefully will remain off...   Past Surgical History  Procedure Date  . Cholecystectomy 1994  . L4-5 discectomy 4/98    Dr. Newell Coral   . Lumbar laminectomy 9/04    and microdiscectomy. L3-4. Dr. Newell Coral  . Lumbar decompression and ray cage 8/98    L4-5 Dr. Jule Ser    Outpatient Encounter Prescriptions as of 03/06/2011  Medication Sig Dispense Refill  . amLODipine (NORVASC) 10 MG tablet TAKE ONE TABLET BY MOUTH DAILY  30 tablet  5  . aspirin 81 MG EC tablet Take 81 mg by mouth daily.        Marland Kitchen atorvastatin (LIPITOR) 20 MG tablet Take 1 tablet (20 mg total) by mouth daily.  30 tablet  6  . benazepril (LOTENSIN) 20 MG tablet Take 1 tablet (20 mg total) by mouth daily.  30 tablet  11  . betamethasone dipropionate (DIPROLENE) 0.05 % cream Apply topically 2 (two) times daily.        . Coenzyme Q10 (CO Q-10) 200 MG CAPS Take 1 capsule  by mouth daily.    0  . Glucosamine-Chondroit-Vit C-Mn (GLUCOSAMINE 1500 COMPLEX) CAPS Take 2 capsules by mouth 2 (two) times daily.        Marland Kitchen HYDROcodone-acetaminophen (VICODIN) 5-500 MG per tablet TAKE ONE TABLET BY MOUTH THREE TIMES DAILY AS NEEDED FOR PAIN  90 tablet  5  . isosorbide mononitrate (IMDUR) 30 MG 24 hr tablet Take 1 tablet (30 mg total) by mouth at bedtime.  30 tablet  6  . loperamide (IMODIUM A-D) 2 MG tablet Take 2 mg by mouth daily.        . metoprolol (LOPRESSOR) 50 MG tablet Take 1 tablet (50 mg total) by mouth 2 (two) times daily.  60 tablet  6  . nitroGLYCERIN (NITROSTAT) 0.4 MG SL tablet Place 0.4 mg under the tongue every 5 (five) minutes as needed.        . Omega-3 Fatty Acids (FISH OIL) 1000 MG CAPS Take 2 capsules by mouth 2 (two) times daily.        Marland Kitchen omeprazole (PRILOSEC) 20 MG capsule Take 20 mg by mouth daily.        . vitamin B-12 (CYANOCOBALAMIN) 1000 MCG tablet Take 1,000 mcg by mouth daily.        Marland Kitchen DISCONTD: benazepril (LOTENSIN) 20 MG tablet TAKE ONE TABLET BY MOUTH DAILY  30 tablet  11    Allergies  Allergen Reactions  . Celecoxib     REACTION: hives and itching    Current Medications, Allergies, Past Medical History, Past Surgical History, Family History, and Social History were reviewed in [employer] record.    Review of Systems         See HPI - all other systems neg except as noted...  The patient complains of dyspnea on exertion and difficulty walking.  The patient denies anorexia, fever, weight loss, weight gain, vision loss, decreased hearing, hoarseness, chest pain, syncope, peripheral edema,  prolonged cough, headaches, hemoptysis, abdominal pain, melena, hematochezia, severe indigestion/heartburn, hematuria, incontinence, muscle weakness, suspicious skin lesions, transient blindness, depression, unusual weight change, abnormal bleeding, enlarged lymph nodes, and angioedema.   Objective:   Physical Exam     WD, WN,  71 y/o WM in NAD... GENERAL:  Alert & oriented; pleasant & cooperative... HEENT:  [place]/AT, EOM-wnl, PERRLA, EACs-clear, TMs-wnl, NOSE-clear, THROAT-clear & wnl. NECK:  Supple w/ fairROM; no JVD; normal carotid impulses w/o bruits; no thyromegaly or nodules palpated; no lymphadenopathy. CHEST:  Clear to P & A; without wheezes/ rales/ or rhonchi heard... HEART:  Regular Rhythm; gr 1-2 sys murmur at base without rubs or gallops detected... ABDOMEN:  Soft & nontender; normal bowel sounds; no organomegaly or masses palpated... EXT: without deformities, mild arthritic changes; no varicose veins/ +venous insuffic/ no edema. NEURO:  CN's intact; motor testing normal; sensory testing normal; gait normal & balance OK. DERM:  No lesions noted; no rash etc...  RADIOLOGY DATA:  Reviewed in the EPIC EMR & discussed w/ the patient...  LABORATORY DATA:  Reviewed in the EPIC EMR & discussed w/ the patient...   Assessment & Plan:   HBP>  Controlled on Metoprolol, Amlodipine, Benazepril; continue same...  CAD>  Followed by DrMcLean & stable on ASA, BBlocker, ACE, Statin combo; no angina, needs incr activity & diet efforts, get wt down!  AS>  Followed by DrMcLean, murmur w/o change, no symptoms, continue observation...  Peripheral vasc dis w/ ~3cmAAA>  Seen on CT Abd 11/12 and he will need f/u Abd Ao ultrasound later...  HYPERLIPID>  Changed to Lipitor 20mg  + CoQ10 per Cards; encouraged to take regularly & get on low fat diet!!!  GERD>  Stable on Omep20mg /d...  Divertics, Polyps>  Up to date on colon screening from  DrStark...  BPH>  Recent PSA= 3.30 & we are following...  DJD>  Followed by [facility] & her notews are reviewed...  Hx Brainstem stroke>  Stable on ASA w/o any cerebral ischemic symptoms...  Other medical problems as noted...   Patient's Medications  New Prescriptions   No medications on file  Previous Medications   AMLODIPINE (NORVASC) 10 MG TABLET    TAKE ONE TABLET BY MOUTH DAILY    ASPIRIN 81 MG EC TABLET    Take 81 mg by mouth daily.     BENAZEPRIL (LOTENSIN) 20 MG TABLET    Take 1 tablet (20 mg total) by mouth daily.   BETAMETHASONE DIPROPIONATE (DIPROLENE) 0.05 % CREAM    Apply topically 2 (two) times daily.     COENZYME Q10 (CO Q-10) 200 MG CAPS    Take 1 capsule by mouth daily.   GLUCOSAMINE-CHONDROIT-VIT C-MN (GLUCOSAMINE 1500 COMPLEX) CAPS    Take 2 capsules by mouth 2 (two) times daily.     HYDROCODONE-ACETAMINOPHEN (VICODIN) 5-500 MG PER TABLET    TAKE ONE TABLET BY MOUTH THREE TIMES DAILY AS NEEDED FOR PAIN   ISOSORBIDE MONONITRATE (IMDUR) 30 MG 24 HR TABLET    Take 1 tablet (30 mg total) by mouth at bedtime.   LOPERAMIDE (IMODIUM A-D) 2 MG TABLET    Take 2 mg by mouth daily.     METOPROLOL (LOPRESSOR) 50 MG TABLET    Take 1 tablet (50 mg total) by mouth 2 (two) times daily.   NITROGLYCERIN (NITROSTAT) 0.4 MG SL TABLET    Place 0.4 mg under the tongue every 5 (five) minutes as needed.     OMEGA-3 FATTY ACIDS (FISH OIL) 1000 MG CAPS  Take 2 capsules by mouth 2 (two) times daily.     OMEPRAZOLE (PRILOSEC) 20 MG CAPSULE    Take 20 mg by mouth daily.     VITAMIN B-12 (CYANOCOBALAMIN) 1000 MCG TABLET    Take 1,000 mcg by mouth daily.    Modified Medications   Modified Medication Previous Medication   ATORVASTATIN (LIPITOR) 20 MG TABLET atorvastatin (LIPITOR) 20 MG tablet      Take 1 tablet (20 mg total) by mouth daily.    Take 1 tablet (20 mg total) by mouth daily.  Discontinued Medications   BENAZEPRIL (LOTENSIN) 20 MG TABLET    TAKE ONE TABLET BY MOUTH DAILY

## 2011-03-06 NOTE — Patient Instructions (Signed)
Today we updated your med list in our EPIC system...    Continue your current medications the same...   We re-wrote your Lipitor prescription to take to [facility] in [place] for 36mo supply...  DrMcLean will recheck your blood work at your next visit...  Call for any questions...  Let's plan a follow up appt in 6 months to check your progress.Marland KitchenMarland Kitchen

## 2011-03-08 ENCOUNTER — Encounter: Payer: Self-pay | Admitting: Pulmonary Disease

## 2011-03-14 ENCOUNTER — Telehealth: Payer: Self-pay | Admitting: Pulmonary Disease

## 2011-03-14 ENCOUNTER — Ambulatory Visit: Payer: Medicare HMO | Admitting: Pulmonary Disease

## 2011-03-14 ENCOUNTER — Other Ambulatory Visit: Payer: Self-pay | Admitting: Pulmonary Disease

## 2011-03-14 MED ORDER — ISOSORBIDE MONONITRATE ER 30 MG PO TB24: 30.0000 mg | ORAL_TABLET | Freq: Every day | ORAL | Status: DC

## 2011-03-14 MED ORDER — METOPROLOL TARTRATE 50 MG PO TABS: 50.0000 mg | ORAL_TABLET | Freq: Two times a day (BID) | ORAL | Status: DC

## 2011-03-14 MED ORDER — HYDROCODONE-ACETAMINOPHEN 5-500 MG PO TABS: ORAL_TABLET | ORAL | Status: DC

## 2011-03-14 MED ORDER — AMLODIPINE BESYLATE 10 MG PO TABS: 10.0000 mg | ORAL_TABLET | Freq: Every day | ORAL | Status: DC

## 2011-03-14 MED ORDER — BENAZEPRIL HCL 20 MG PO TABS: 20.0000 mg | ORAL_TABLET | Freq: Every day | ORAL | Status: DC

## 2011-03-14 NOTE — Telephone Encounter (Signed)
Called spoke with patient's wife who looked at patient's medication bottles to verify medications needing refills: Amlodipine 10mg  Isosorbide 30mg  Metoprolol 50 mg BID Benazepril 20mg  Vicodin 5-500mg   Vicodin printed off for SN to sign > will be mailed to pt's home as this is a controlled medication.  All other medications sent to [facility] 30day supply.  vicodin rx placed on SN's cart with addressed enveloped.  Will sign off.

## 2011-04-03 ENCOUNTER — Other Ambulatory Visit: Payer: Self-pay | Admitting: Cardiology

## 2011-04-13 ENCOUNTER — Ambulatory Visit (INDEPENDENT_AMBULATORY_CARE_PROVIDER_SITE_OTHER): Payer: Medicare Other | Admitting: Cardiology

## 2011-04-13 ENCOUNTER — Encounter: Payer: Self-pay | Admitting: Cardiology

## 2011-04-13 VITALS — BP 114/62 | HR 64 | Wt 183.4 lb

## 2011-04-13 DIAGNOSIS — I359 Nonrheumatic aortic valve disorder, unspecified: Secondary | ICD-10-CM

## 2011-04-13 DIAGNOSIS — I2510 Atherosclerotic heart disease of native coronary artery without angina pectoris: Secondary | ICD-10-CM

## 2011-04-13 DIAGNOSIS — I10 Essential (primary) hypertension: Secondary | ICD-10-CM

## 2011-04-13 DIAGNOSIS — E785 Hyperlipidemia, unspecified: Secondary | ICD-10-CM

## 2011-04-13 LAB — BASIC METABOLIC PANEL
CO2: 29 mEq/L (ref 19–32)
Chloride: 102 mEq/L (ref 96–112)
Creatinine, Ser: 0.8 mg/dL (ref 0.4–1.5)
Sodium: 137 mEq/L (ref 135–145)

## 2011-04-13 LAB — LIPID PANEL
LDL Cholesterol: 63 mg/dL (ref 0–99)
Total CHOL/HDL Ratio: 4

## 2011-04-13 LAB — HEPATIC FUNCTION PANEL
ALT: 12 U/L (ref 0–53)
Alkaline Phosphatase: 94 U/L (ref 39–117)
Bilirubin, Direct: 0.1 mg/dL (ref 0.0–0.3)
Total Bilirubin: 0.3 mg/dL (ref 0.3–1.2)
Total Protein: 6.9 g/dL (ref 6.0–8.3)

## 2011-04-13 NOTE — Patient Instructions (Signed)
Your physician wants you to follow-up in: 6 months.   You will receive a reminder letter in the mail two months in advance. If you don't receive a letter, please call our office to schedule the follow-up appointment.  Your physician recommends that you return for lab work in: Today (lipid, bmet, liver)

## 2011-04-13 NOTE — Assessment & Plan Note (Signed)
Stable with no ischemic symptoms.  Continue ASA, statin, beta blocker, ACEI.   

## 2011-04-13 NOTE — Assessment & Plan Note (Signed)
Blood pressure is under good control.  

## 2011-04-13 NOTE — Assessment & Plan Note (Signed)
Minimal stenosis on last echo.  Murmur is stable.  

## 2011-04-13 NOTE — Assessment & Plan Note (Signed)
Repeat lipids, goal LDL < 70.  He is tolerating atorvastatin 20 despite some minor cramping and is ok with continuing it.  Continue coenzyme Q 10 200 mg daily.

## 2011-04-13 NOTE — Progress Notes (Signed)
PCP: Dr. Kriste Basque  71 yo with history of CAD, HTN, and hyperlipidemia presents for followup.  Patient had described some exertional chest pain and myoview showed a partially reversible inferior defect, so LHC was done in 7/10.  This showed that his proximal RCA was totally occluded.  There were very robust left to right collaterals.  There was also a 50% proximal LAD stenosis.  The patient was managed medically.  Since that time, he has been doing well symptomatically.  He has had no further exertional chest pain.  He has good exercise tolerance without exertional dyspnea (yardwork, contractor work, Training and development officer).   He is working part time doing Soil scientist.    Patient has significant back and knee pain.  He also has had cramps in his arms and thighs. I took him off Crestor in the past, which helped with the cramps.  He did well on pravastatin but lipids were too high.  Now he is on atorvastatin 20 and the cramps have returned, but less severe than when he was on Crestor.    ECG: NSR, normal  Labs (12/10): HDL 28, LDL 70, K 4.3, creatinine 0.8 Labs (4/11): K 4, creatinine 0.8, LDL 53, HDL 26, LFTs normal Labs (16/10): LDL 74, HDL 27, K 4, creatinine 0.9, LFTs normal, TSH normal Labs (7/12): LDL 87, HDL 33 Labs (9/12): LDL 66, HDL 33, LFTs normal  Allergies (verified):  1)  ! Celebrex  Past Medical History: 1. Hyperlipidemia 2. Minimal aortic stenosis.  Echocardiogram in August 2009 showed EF 60%, no regional wall motion abnormalities, mild LVH, moderate focal basal septal hypertrophy, and mild diastolic dysfunction.  There was a very mild aortic stenosis with partially fused left and right coronary cusps and some restricted motion of the aortic valve.  The mean gradient, however, across the aortic valve was only 8 mmHg.  There was also mild left atrial enlargement and normal RV size and function.  Minimal AS on LHC in 7/10.  3. Hypertension. 4. Gastroesophageal reflux disease. 5. BPH. 6.  Diverticulosis. 7. Smoked until 7/10. 8. CAD: LHC (7/10) showed totally occluded proximal RCA with very robust left to right collaterals, 50% proximal LAD stenosis, EF 65%.  Medical management.   9. Osteoarthritis 10. Colonic polyps 11. h/o TIA 12. 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  This manifested with slurred speech. 13. History of low back pain.  The patient is status post surgical back fusion. 14. History of cholecystectomy.  Family History: Father alive age 88 w/ CAD & CABG at [facility] in 08-Jul-2007 Mother died age 24 w/ Alzheimer's disease, hx of DJD 1 Sibling- Brother alive & well w/ DJD in his hips  Social History: Married- 3rd marriage, wife= Etta 2 children from 1st marriage, 4 step children Former smoker, 40+yrs of >1ppd, quit 7/10.  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 amLODipine (NORVASC) 10 MG tablet Take 1 tablet (10 mg total) by mouth daily.  30 tablet  11  . aspirin 81 MG EC tablet Take 81 mg by mouth daily.        Marland Kitchen atorvastatin (LIPITOR) 20 MG tablet Take 1 tablet (20 mg total) by mouth daily.  180 tablet  1  . benazepril (LOTENSIN) 20 MG tablet Take 1 tablet (20 mg total) by mouth daily.  30 tablet  11  . betamethasone dipropionate (DIPROLENE) 0.05 % cream  Apply topically 2 (two) times daily.        . Coenzyme Q10 (CO Q-10) 200 MG CAPS Take 1 capsule by mouth daily.    0  . Glucosamine-Chondroit-Vit C-Mn (GLUCOSAMINE 1500 COMPLEX) CAPS Take 2 capsules by mouth 2 (two) times daily.        Marland Kitchen HYDROcodone-acetaminophen (VICODIN) 5-500 MG per tablet TAKE ONE TABLET BY MOUTH THREE TIMES DAILY AS NEEDED FOR PAIN  90 tablet  5  . isosorbide mononitrate (IMDUR) 30 MG 24 hr tablet Take 1 tablet (30 mg total) by mouth at bedtime.  30 tablet  11  . loperamide (IMODIUM A-D) 2 MG tablet Take 2 mg by  mouth daily.        . metoprolol (LOPRESSOR) 50 MG tablet Take 1 tablet (50 mg total) by mouth 2 (two) times daily.  60 tablet  11  . nitroGLYCERIN (NITROSTAT) 0.4 MG SL tablet Place 0.4 mg under the tongue every 5 (five) minutes as needed.        . Omega-3 Fatty Acids (FISH OIL) 1000 MG CAPS Take 2 capsules by mouth 2 (two) times daily.        Marland Kitchen omeprazole (PRILOSEC) 20 MG capsule Take 20 mg by mouth daily.        . vitamin B-12 (CYANOCOBALAMIN) 1000 MCG tablet Take 1,000 mcg by mouth daily.          BP 114/62  Pulse 64  Wt 183 lb 6.4 oz (83.19 kg) General:  Well developed, well nourished, in no acute distress. Neck:  Neck supple, no JVD. No masses, thyromegaly or abnormal cervical nodes. Lungs:  Clear bilaterally to auscultation and percussion. Heart:  Non-displaced PMI, chest non-tender; regular rate and rhythm, S1, S2 without  rubs or gallops.  2/6 systolic crescendo-decrescendo early peaking murmur at RUSB.  Carotid upstroke normal, no bruit.  Pedals normal pulses. No edema, no varicosities. Abdomen:  Bowel sounds positive; abdomen soft and non-tender without masses, organomegaly, or hernias noted. No hepatosplenomegaly. Extremities:  No clubbing or cyanosis. Neurologic:  Alert and oriented x 3. Psych:  Normal affect.

## 2011-05-01 ENCOUNTER — Telehealth: Payer: Self-pay | Admitting: Pulmonary Disease

## 2011-05-01 MED ORDER — AMOXICILLIN-POT CLAVULANATE 875-125 MG PO TABS: 1.0000 | ORAL_TABLET | Freq: Two times a day (BID) | ORAL | Status: AC

## 2011-05-01 NOTE — Telephone Encounter (Signed)
Per SN---call in augmentin 875mg   #14  1 po bid until gone, mucinex 2 po bid with plenty of fluids.  thanks

## 2011-05-01 NOTE — Telephone Encounter (Signed)
Spoke with pt's spouse and notified of recs per SN. She verbalized understanding and states nothing further needed. Rx was sent to pharm.

## 2011-05-01 NOTE — Telephone Encounter (Signed)
Spoke with pt. He is c/o sinus infection- "feels like hit in the face with a baseball bat".  He states that his head is stopped up, has HA, facial pressure- esp underneath eyes, teeth pain. He states onset was 1 wk ago. He states that he his spouse was recently txed for sinus infection and was placed on abx. He would like something called in. Please advise, thanks! Allergies  Allergen Reactions  . Celecoxib     REACTION: hives and itching

## 2011-05-07 ENCOUNTER — Other Ambulatory Visit: Payer: Self-pay | Admitting: Pulmonary Disease

## 2011-06-29 ENCOUNTER — Other Ambulatory Visit: Payer: Self-pay | Admitting: Pulmonary Disease

## 2011-10-11 ENCOUNTER — Encounter: Payer: Self-pay | Admitting: Pulmonary Disease

## 2011-10-11 ENCOUNTER — Ambulatory Visit (INDEPENDENT_AMBULATORY_CARE_PROVIDER_SITE_OTHER)
Admission: RE | Admit: 2011-10-11 | Discharge: 2011-10-11 | Disposition: A | Discharge status: Home / Self Care | Payer: Medicare Other | Source: Ambulatory Visit | Attending: Pulmonary Disease | Admitting: Pulmonary Disease

## 2011-10-11 ENCOUNTER — Other Ambulatory Visit (INDEPENDENT_AMBULATORY_CARE_PROVIDER_SITE_OTHER): Payer: Medicare Other

## 2011-10-11 ENCOUNTER — Ambulatory Visit (INDEPENDENT_AMBULATORY_CARE_PROVIDER_SITE_OTHER): Payer: Medicare Other | Admitting: Pulmonary Disease

## 2011-10-11 VITALS — BP 114/62 | HR 70 | Temp 97.1°F | Ht 70.0 in | Wt 181.4 lb

## 2011-10-11 DIAGNOSIS — K5730 Diverticulosis of large intestine without perforation or abscess without bleeding: Secondary | ICD-10-CM

## 2011-10-11 DIAGNOSIS — I6350 Cerebral infarction due to unspecified occlusion or stenosis of unspecified cerebral artery: Secondary | ICD-10-CM

## 2011-10-11 DIAGNOSIS — I10 Essential (primary) hypertension: Secondary | ICD-10-CM

## 2011-10-11 DIAGNOSIS — Z125 Encounter for screening for malignant neoplasm of prostate: Secondary | ICD-10-CM

## 2011-10-11 DIAGNOSIS — I2510 Atherosclerotic heart disease of native coronary artery without angina pectoris: Secondary | ICD-10-CM

## 2011-10-11 DIAGNOSIS — M545 Low back pain, unspecified: Secondary | ICD-10-CM

## 2011-10-11 DIAGNOSIS — F411 Generalized anxiety disorder: Secondary | ICD-10-CM

## 2011-10-11 DIAGNOSIS — E538 Deficiency of other specified B group vitamins: Secondary | ICD-10-CM

## 2011-10-11 DIAGNOSIS — Z87898 Personal history of other specified conditions: Secondary | ICD-10-CM

## 2011-10-11 DIAGNOSIS — Z23 Encounter for immunization: Secondary | ICD-10-CM

## 2011-10-11 DIAGNOSIS — E785 Hyperlipidemia, unspecified: Secondary | ICD-10-CM

## 2011-10-11 DIAGNOSIS — M1990 Unspecified osteoarthritis, unspecified site: Secondary | ICD-10-CM

## 2011-10-11 DIAGNOSIS — I359 Nonrheumatic aortic valve disorder, unspecified: Secondary | ICD-10-CM

## 2011-10-11 DIAGNOSIS — K219 Gastro-esophageal reflux disease without esophagitis: Secondary | ICD-10-CM

## 2011-10-11 DIAGNOSIS — J45909 Unspecified asthma, uncomplicated: Secondary | ICD-10-CM

## 2011-10-11 LAB — LIPID PANEL
HDL: 30 mg/dL — ABNORMAL LOW (ref >39.00)
LDL Cholesterol: 62 mg/dL (ref 0–99)
Total CHOL/HDL Ratio: 4
Triglycerides: 164 mg/dL — ABNORMAL HIGH (ref 0.0–149.0)

## 2011-10-11 LAB — HEPATIC FUNCTION PANEL
Alkaline Phosphatase: 91 U/L (ref 39–117)
Bilirubin, Direct: 0.1 mg/dL (ref 0.0–0.3)
Total Bilirubin: 0.6 mg/dL (ref 0.3–1.2)

## 2011-10-11 LAB — BASIC METABOLIC PANEL
CO2: 27 mEq/L (ref 19–32)
Calcium: 9.3 mg/dL (ref 8.4–10.5)
Chloride: 104 mEq/L (ref 96–112)
Creatinine, Ser: 0.8 mg/dL (ref 0.4–1.5)
Sodium: 138 mEq/L (ref 135–145)

## 2011-10-11 LAB — CBC WITH DIFFERENTIAL/PLATELET
Basophils Absolute: 0 10*3/uL (ref 0.0–0.1)
Basophils Relative: 0.5 % (ref 0.0–3.0)
Eosinophils Absolute: 0.3 10*3/uL (ref 0.0–0.7)
Hemoglobin: 14.1 g/dL (ref 13.0–17.0)
Lymphs Abs: 1.7 10*3/uL (ref 0.7–4.0)
MCHC: 33.5 g/dL (ref 30.0–36.0)
MCV: 87.9 fl (ref 78.0–100.0)
Monocytes Absolute: 0.5 10*3/uL (ref 0.1–1.0)
Neutro Abs: 4.6 10*3/uL (ref 1.4–7.7)
RBC: 4.8 Mil/uL (ref 4.22–5.81)
RDW: 13.2 % (ref 11.5–14.6)

## 2011-10-11 MED ORDER — METOPROLOL TARTRATE 50 MG PO TABS: 50.0000 mg | ORAL_TABLET | Freq: Two times a day (BID) | ORAL | Status: DC

## 2011-10-11 MED ORDER — BENAZEPRIL HCL 20 MG PO TABS: 20.0000 mg | ORAL_TABLET | Freq: Every day | ORAL | Status: DC

## 2011-10-11 MED ORDER — ATORVASTATIN CALCIUM 20 MG PO TABS: 20.0000 mg | ORAL_TABLET | Freq: Every day | ORAL | Status: DC

## 2011-10-11 MED ORDER — NITROGLYCERIN 0.4 MG SL SUBL: 0.4000 mg | SUBLINGUAL_TABLET | SUBLINGUAL | Status: DC | PRN

## 2011-10-11 MED ORDER — ISOSORBIDE MONONITRATE ER 30 MG PO TB24: 30.0000 mg | ORAL_TABLET | Freq: Every day | ORAL | Status: DC

## 2011-10-11 MED ORDER — HYDROCODONE-ACETAMINOPHEN 5-500 MG PO TABS: ORAL_TABLET | ORAL | Status: DC

## 2011-10-11 MED ORDER — OMEPRAZOLE 20 MG PO CPDR: 20.0000 mg | DELAYED_RELEASE_CAPSULE | Freq: Every day | ORAL | Status: DC

## 2011-10-11 MED ORDER — AMLODIPINE BESYLATE 10 MG PO TABS: 10.0000 mg | ORAL_TABLET | Freq: Every day | ORAL | Status: DC

## 2011-10-11 NOTE — Patient Instructions (Addendum)
Today we updated your med list in our EPIC system...    Continue your current medications the same...  Today we did your follow up CXR & fasting blood work...    We will call you w/ the results when avail...  Congrats on quitting smoking!!!    Keep up the good work...  Stay as active as possible...  Let's plan a follow up visit in 6 months.Marland KitchenMarland Kitchen

## 2011-10-11 NOTE — Progress Notes (Signed)
Subjective:    Patient ID: Brandon [name], male    DOB: 1940-12-18, 71 y.o.   MRN: [id number]  HPI 71 y/o WM here for a follow up visit... he has multiple medical problems as noted below...   ~  November 23, 2008:  Cardiac eval 7/10 by DrMcLean for CP w/ cath showing occluded RCA w/ collaterals & non-obstructive dis in other vessels- med Rx w/ ASA, Imdur, Benazepril + incr Lipitor... he finally quit smoking as well!!!  Under incr stress w/ step daughter's illness...  ~  May 23, 2009:  generally stable w/ CC= arthritis pain... he saw DrMcLean 12/10 for f/u CAD, HBP, Chol > continue aggressive med management, changed Lip20 to Crestor40- only taking 1/2 tab daily...  BP controlled on meds;  AS stable & asymptomatic;  FLP on the Cres20 looks better but HDL too low- rec incr exercise;  other issues as noted...  ~  January 25, 2010:  8 mo ROV- his CC is arthritis pains & he saw Rheum- DrHawkes/ "I've got osteoarthritis" treated w/ Tylenol, Vicodin, & Voltaren gel;  +LBP w/ some leg numbness s/p surg... he requests Relafen which he [facility] from [facility] in the past & really seemed to help- OK...  he had f/u DrMcLean 7/11- stable, no angina, continue same meds... labs from DrHawkes= all WNL x borderline low B12= 190 (180-914) and we discussed oral supplementation first w/ 1080mcg/d OTC & we will f/u labs on return...  ~  September 06, 2010:  31mo ROV & he had labs done 08/08/10 & reviewed w/ the pt>  FLP looks ok on Prav40 but TG still too hi & LDL goal <70 so DrMcLean switched him once again to LIPITOR 20mg /d w CoQ10 & instructed on low carb, wt reducing, low fat diet...    He saw DrMcLean 7/12 for Cards f/u of his HBP, CAD, AS, & Chol> stable on ASA, BBlocker, ACE, & Statin; baseline EKG is NSR, WNL; aching & soreness not from Statin since neither stopping Statin & switching Cres20 to Prav40 has made a difference; asked to switch Prev40 to LIPITOR 20mg  w/ CoQ10 ~200mg /d and f/u FLP later (done 9/12 & improved-  see below).    He saw [facility] 3/12 for Rheum f/u of his OA> still c/o pain in all of his joints, takes Nabumetone as needed w/ relief, also notes less "achiness" since off the Crestor; she rec Tylenol more regularly & Voltaren Gel...  ~  March 06, 2011:  20mo ROV & he's had a good 20mo he says; c/o nasal drainage, incr stress w/ [facility] for detached retina, some intermit dizziness, & his arthritis pain + LBP evaluated by DrNudelman "I'm eaten up by bone spurs & treat it w/ Relafen"... Requesting 20mo Rx of eligible meds for [name]...  ~  October 11, 2011:  31mo ROV & Azavier says he is stable- no new complaints or concerns; he has some arthritis in his knees but notes that topical Rx helps; states he's quit smoking- AGAIN- and we discussed staying quit this time; he has been exercising by pushing his mower & denies dyspnea; weight is down 5# to 181# today...     We reviewed prob list, meds, xrays and labs> see below for updates >> CXR 9/13 showed normal heart size, clear lungs x calcif granuloma LLL, NAD... LABS 9/13:  FLP- Chol ok but TG=164 HDL=30;  Chems- wnl;  CBC- wnl;  TSH=0.92;  PSA=2/71;  B12=1179  Problem List:  ALLERGIC RHINITIS (ICD-477.9) - he uses OTC antihistamines and nasal saline spray as needed...  ASTHMATIC BRONCHITIS >> not on regular meds... ~  CXR 9/13 showed normal heart size, clear lungs x calcif granuloma LLL, NAD... ~  9/13: pt states he weaned down off cigs from 1.5ppd to quit over the last 45mo, now using E-cig & advised to cut that down as well...  HYPERTENSION (ICD-401.9) - controlled on METOPROLOL 50mg Bid, AMLODIPINE 10mg /d, BENAZEPRIL 20mg /d...  ~  CXR 7/09 showed low lung vol, clear, norm heart size, NAD.Marland Kitchen. ~  8/12:  BP= 136/74 today & OK per pt at home; tolerates meds well and denies HA, fatigue, visual changes, CP, palipit, dizziness, syncope, dyspnea, edema, etc... ~  1/13:  BP= 118/68 & he remains asymptomatic w/o CP, palpit, SOB,  edema, etc... ~  9/13:  BP= 114/62 & he remains asymptomatic...  CAD, NATIVE VESSEL (ICD-414.01) - [facility] for Cards... on ASA 81mg /d, + IMDUR 30mg /d... ~  Myoview 7/10 showed mild infer ischemia, ST abn as well, EF=69% w/ norm wall motion. ~  Cath 7/10 showed total occlusion of prox RCA, w/ left to right collaterals (from distal LAD); 50% LAD stenosis & small CIRC w/ 40% lesion- MedRx per Cards. ~  He denies CP, palpit, dizzy, syncope, SOB, edema, etc; rec to incr exercise program... ~  EKG 7/12 showed NSR, rate66, WNL, NAD... ~  He notes that his father is 3 & had a triple bypass at 68; now showing signs of early dementia. ~  EKG 3/13 showed NSR, rate64, wnl, NAD... ~  He saw Cards, DrMcLean 3/13> HBP, CAD, Chol> last cath 7/10 w/ proxRCA occlusion, 50%proxLAD, lots of collaterals; med Rx- doing well...  AORTIC STENOSIS (ICD-424.1) - eval by DrMcLean for Cards 9/09... 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 ~  baseline EKG shows NSR, WNL... 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 ~  2DEcho 7/10 showed mod conc LVH, EF=55-60% & norm wall motion, mild AS, mild LA dil & lipomatous hypertrophy of septum.  HYPERLIPIDEMIA (ICD-272.4) - now on LIPITOR 40mg - 1/2 tab daily + Fish Oil supplements... DrMcLean has made freq adjustment to his meds (leg cramps on Crestor). ~  FLP 11/08 showed TChol 162, TG 153, HDL 30, LDL 101...needs better diet, may need incr dose... ~  FLP 7/09 showed TChol 146, Tg 188, HDL 27, LDL 82 ~  FLP 4/10 showed TChol 144, TG 205, HDL 29, LDL 81 ~  FLP 10/10 showed TChol 133, TG 187, HDL 28, LDL 68... changed to Crestor40- 1/2 tab daily by Cards. ~  FLP 4/11 showed TChol 115, TG 181, HDL 26, LDL 53 ~  FLP 12/11 on Cres20 showed TChol 135, TG 169, HDL 27, LDL 74... Cres20 stopped due to aching in his joints... ~  FLP 7/12 on Prav40 showed TChol 156, TG 180, HDL 33, LDL 87... DrMcLean changed him to Lip20 + CoQ10. ~  FLP  9/12 on Lip20+CoQ10 showed TChol 129, TG 149, HDL 33, LDL 66... Continue Lip20. ~  FLP 3/13 on Lip20 showed TChol 122, TG 132, HDL 33, LDL 63 ~  FLP 9/13 on Lip20 showed TChol 125, TG 164, HDL 30, LDL 62... Needs better low fat diet & incr exercise.  GERD (ICD-530.81) - on OMEPRAZOLE 20mg /d... last EGD was urgent procedure 12/07 by DrBrodie w/ meat impaction- 3cmHH, stricture- subseq dilated w/ Savary dilators and no trouble since then.Marland KitchenMarland Kitchen  DIVERTICULOSIS OF COLON (ICD-562.10) - colonoscopy 7/00 by DrStark showed divertics, hems...  ~  last colonoscopy 9/09 by drStark showed divertics, hems, 5mm adenomatous colon polyp removed... f/u 29yrs.  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  BENIGN PROSTATIC HYPERTROPHY, HX OF (ICD-V13.8) - eval by DrGrapey in 2006 w/ BPH, mild outlet symptoms, ED, and hematospermia...  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 he tells me he has been on disability due to his back- per [facility]... currently using TYLENOL + VICODIN (up to 2 per day) & RELAFEN 500mg Bid as needed for pain... ~  3/12:  Rheum f/u DrHawks & she endorsed the use of Tylenol + Voltaren gel for his pain; he also takes Glucosamine & Chondroitin fo his joints. ~  11/12: Lumbar CT per DrNudelman> severe atherosclerosis & infrarenal AAA ~3cm; prev surg w/ ray cage fusion grafts, bulging discs, etc (see report). ~  11/12: MRI Lumbar spine per DrNudelman> bulging discs 7 scarring from prev surg...  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 on ASA 325mg /d + NO Smoking + Chol Rx + HBP Rx etc... no known recurrence...  ANXIETY (ICD-300.00) - prev on Xanax as needed for nerves, this med is no longer on his list (?uses wife's Rx)... stress w/ wife & insomnia...  BORDERLINE B12 DEFICIENCY - B12 level 6/11 at [facility] office = 190 ([phone])... rec to start oral B12 supplementation w/ daily. ~  Labs 9/13 showed B12 level = 1179... Ok to decr the B12 supplement.  Health Maintenance - he has been a mod smoker and quits on & off... currently off x 6months and hopefully will remain off...   Past Surgical History  Procedure Date  . Cholecystectomy 1994  . L4-5 discectomy 4/98    Dr. Newell Coral   . Lumbar laminectomy 9/04    and microdiscectomy. L3-4. Dr. Newell Coral  . Lumbar decompression and ray cage 8/98    L4-5 Dr. Jule Ser    Outpatient Encounter Prescriptions as of 10/11/2011  Medication Sig Dispense Refill  . amLODipine (NORVASC) 10 MG tablet Take 1 tablet (10 mg total) by mouth daily.  30 tablet  11  . aspirin 81 MG EC tablet Take 81 mg by mouth daily.        Marland Kitchen atorvastatin (LIPITOR) 20 MG tablet Take 1 tablet (20 mg total) by mouth daily.  180 tablet  1  . benazepril (LOTENSIN) 20 MG tablet Take 1 tablet (20 mg total) by mouth daily.  30 tablet  11  . benazepril (LOTENSIN) 20 MG tablet TAKE 1 TABLET BY MOUTH DAILY  30 tablet  10  . betamethasone dipropionate (DIPROLENE) 0.05 % cream Apply topically 2 (two) times daily.        . Coenzyme Q10 (CO Q-10) 200 MG CAPS Take 1 capsule by mouth daily.    0  . HYDROcodone-acetaminophen (VICODIN) 5-500 MG per tablet TAKE ONE  TABLET BY MOUTH THREE TIMES DAILY AS NEEDED FOR PAIN  90 tablet  5  . isosorbide mononitrate (IMDUR) 30 MG 24 hr tablet Take 1 tablet (30 mg total) by mouth at bedtime.  30 tablet  11  . loperamide (IMODIUM A-D) 2 MG tablet Take 2 mg by mouth daily.        . metoprolol  (LOPRESSOR) 50 MG tablet Take 1 tablet (50 mg total) by mouth 2 (two) times daily.  60 tablet  11  . nitroGLYCERIN (NITROSTAT) 0.4 MG SL tablet Place 0.4 mg under the tongue every 5 (five) minutes as needed.        . Omega-3 Fatty Acids (FISH OIL) 1000 MG CAPS Take 2 capsules by mouth 2 (two) times daily.        Marland Kitchen omeprazole (PRILOSEC) 20 MG capsule Take 20 mg by mouth daily.        . vitamin B-12 (CYANOCOBALAMIN) 1000 MCG tablet Take 1,000 mcg by mouth daily.        Marland Kitchen DISCONTD: atorvastatin (LIPITOR) 20 MG tablet Take 1 tablet (20 mg total) by mouth daily.  180 tablet  1  . DISCONTD: Glucosamine-Chondroit-Vit C-Mn (GLUCOSAMINE 1500 COMPLEX) CAPS Take 2 capsules by mouth 2 (two) times daily.        Marland Kitchen DISCONTD: metoprolol (LOPRESSOR) 50 MG tablet TAKE 1 TABLET BY MOUTH TWICE DAILY  60 tablet  5    Allergies  Allergen Reactions  . Celecoxib     REACTION: hives and itching    Current Medications, Allergies, Past Medical History, Past Surgical History, Family History, and Social History were reviewed in [employer] record.    Review of Systems         See HPI - all other systems neg except as noted...  The patient complains of dyspnea on exertion and difficulty walking.  The patient denies anorexia, fever, weight loss, weight gain, vision loss, decreased hearing, hoarseness, chest pain, syncope, peripheral edema, prolonged cough, headaches, hemoptysis, abdominal pain, melena, hematochezia, severe indigestion/heartburn, hematuria, incontinence, muscle weakness, suspicious skin lesions, transient blindness, depression, unusual weight change, abnormal bleeding, enlarged lymph nodes, and angioedema.   Objective:   Physical Exam     WD, WN, 71 y/o WM in NAD... GENERAL:  Alert & oriented; pleasant & cooperative... HEENT:  [place]/AT, EOM-wnl, PERRLA, EACs-clear, TMs-wnl, NOSE-clear, THROAT-clear & wnl. NECK:  Supple w/ fairROM; no JVD; normal carotid impulses w/o bruits; no  thyromegaly or nodules palpated; no lymphadenopathy. CHEST:  Clear to P & A; without wheezes/ rales/ or rhonchi heard... HEART:  Regular Rhythm; gr 1-2 sys murmur at base without rubs or gallops detected... ABDOMEN:  Soft & nontender; normal bowel sounds; no organomegaly or masses palpated... EXT: without deformities, mild arthritic changes; no varicose veins/ +venous insuffic/ no edema. NEURO:  CN's intact; motor testing normal; sensory testing normal; gait normal & balance OK. DERM:  No lesions noted; no rash etc...  RADIOLOGY DATA:  Reviewed in the EPIC EMR & discussed w/ the patient...  LABORATORY DATA:  Reviewed in the EPIC EMR & discussed w/ the patient...   Assessment & Plan:    HBP>  Controlled on Metoprolol, Amlodipine, Benazepril; continue same...  CAD>  Followed by DrMcLean & stable on ASA, BBlocker, ACE, Statin combo; no angina, needs incr activity & diet efforts, get wt down!  AS>  Followed by DrMcLean, murmur w/o change, no symptoms, continue observation...  Peripheral vasc dis w/ ~3cmAAA>  Seen on CT Abd 11/12 and he  will need f/u Abd Ao ultrasound later...  HYPERLIPID>  Changed to Lipitor 20mg  + CoQ10 per Cards; encouraged to take regularly & get on low fat diet!!!  GERD>  Stable on Omep20mg /d...  Divertics, Polyps>  Up to date on colon screening from  DrStark...  BPH>  Recent PSA= 2.71 & we are following...  DJD>  Followed by [facility] & her notes are reviewed...  Hx Brainstem stroke>  Stable on ASA w/o any cerebral ischemic symptoms...  Other medical problems as noted...   Patient's Medications  New Prescriptions   No medications on file  Previous Medications   ASPIRIN 81 MG EC TABLET    Take 81 mg by mouth daily.     BETAMETHASONE DIPROPIONATE (DIPROLENE) 0.05 % CREAM    Apply topically 2 (two) times daily.     COENZYME Q10 (CO Q-10) 200 MG CAPS    Take 1 capsule by mouth daily.   LOPERAMIDE (IMODIUM A-D) 2 MG TABLET    Take 2 mg by mouth daily.      OMEGA-3 FATTY ACIDS (FISH OIL) 1000 MG CAPS    Take 2 capsules by mouth 2 (two) times daily.     VITAMIN B-12 (CYANOCOBALAMIN) 1000 MCG TABLET    Take 1,000 mcg by mouth daily.    Modified Medications   Modified Medication Previous Medication   AMLODIPINE (NORVASC) 10 MG TABLET amLODipine (NORVASC) 10 MG tablet      Take 1 tablet (10 mg total) by mouth daily.    Take 1 tablet (10 mg total) by mouth daily.   ATORVASTATIN (LIPITOR) 20 MG TABLET atorvastatin (LIPITOR) 20 MG tablet      Take 1 tablet (20 mg total) by mouth daily.    Take 1 tablet (20 mg total) by mouth daily.   BENAZEPRIL (LOTENSIN) 20 MG TABLET benazepril (LOTENSIN) 20 MG tablet      Take 1 tablet (20 mg total) by mouth daily.    Take 1 tablet (20 mg total) by mouth daily.   HYDROCODONE-ACETAMINOPHEN (VICODIN) 5-500 MG PER TABLET HYDROcodone-acetaminophen (VICODIN) 5-500 MG per tablet      TAKE ONE TABLET BY MOUTH THREE TIMES DAILY AS NEEDED FOR PAIN    TAKE ONE TABLET BY MOUTH THREE TIMES DAILY AS NEEDED FOR PAIN   ISOSORBIDE MONONITRATE (IMDUR) 30 MG 24 HR TABLET isosorbide mononitrate (IMDUR) 30 MG 24 hr tablet      Take 1 tablet (30 mg total) by mouth at bedtime.    Take 1 tablet (30 mg total) by mouth at bedtime.   METOPROLOL (LOPRESSOR) 50 MG TABLET metoprolol (LOPRESSOR) 50 MG tablet      Take 1 tablet (50 mg total) by mouth 2 (two) times daily.    Take 1 tablet (50 mg total) by mouth 2 (two) times daily.   NITROGLYCERIN (NITROSTAT) 0.4 MG SL TABLET nitroGLYCERIN (NITROSTAT) 0.4 MG SL tablet      Place 1 tablet (0.4 mg total) under the tongue every 5 (five) minutes as needed.    Place 0.4 mg under the tongue every 5 (five) minutes as needed.     OMEPRAZOLE (PRILOSEC) 20 MG CAPSULE omeprazole (PRILOSEC) 20 MG capsule      Take 1 capsule (20 mg total) by mouth daily.    Take 20 mg by mouth daily.    Discontinued Medications   BENAZEPRIL (LOTENSIN) 20 MG TABLET    TAKE 1 TABLET BY MOUTH DAILY   GLUCOSAMINE-CHONDROIT-VIT C-MN  (GLUCOSAMINE 1500 COMPLEX) CAPS    Take  2 capsules by mouth 2 (two) times daily.     METOPROLOL (LOPRESSOR) 50 MG TABLET    TAKE 1 TABLET BY MOUTH TWICE DAILY

## 2011-10-14 ENCOUNTER — Encounter: Payer: Self-pay | Admitting: Pulmonary Disease

## 2012-01-25 ENCOUNTER — Encounter: Payer: Self-pay | Admitting: Cardiology

## 2012-01-25 ENCOUNTER — Ambulatory Visit (INDEPENDENT_AMBULATORY_CARE_PROVIDER_SITE_OTHER): Payer: Medicare Other | Admitting: Cardiology

## 2012-01-25 VITALS — BP 118/70 | HR 69 | Ht 70.0 in | Wt 183.2 lb

## 2012-01-25 DIAGNOSIS — I2510 Atherosclerotic heart disease of native coronary artery without angina pectoris: Secondary | ICD-10-CM

## 2012-01-25 DIAGNOSIS — I10 Essential (primary) hypertension: Secondary | ICD-10-CM

## 2012-01-25 DIAGNOSIS — I359 Nonrheumatic aortic valve disorder, unspecified: Secondary | ICD-10-CM

## 2012-01-25 DIAGNOSIS — E785 Hyperlipidemia, unspecified: Secondary | ICD-10-CM

## 2012-01-25 NOTE — Patient Instructions (Addendum)
Your physician wants you to follow-up in: 6 months with Dr McLean. (June 2014). You will receive a reminder letter in the mail two months in advance. If you don't receive a letter, please call our office to schedule the follow-up appointment.  

## 2012-01-25 NOTE — Progress Notes (Signed)
Patient ID: Brandon [name], male   DOB: 04/26/1940, 71 y.o.   MRN: [id number] PCP: Dr. Kriste Basque  71 yo with history of CAD, HTN, and hyperlipidemia presents for followup.  Patient had described some exertional chest pain and myoview showed a partially reversible inferior defect, so LHC was done in 7/10.  This showed that his proximal RCA was totally occluded.  There were very robust left to right collaterals.  There was also a 50% proximal LAD stenosis.  The patient was managed medically.  Since that time, he has been doing well symptomatically.  He has had no further exertional chest pain.  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    Patient has significant back and knee pain.  He had muscle aches with Crestor and is now on atorvastatin, which he is tolerating better.  He still has some mild muscle aching.  He is taking coenzyme Q10.  He does feel that he does not have the muscular strength that he did in the past.  He is not smoking.   Labs (12/10): HDL 28, LDL 70, K 4.3, creatinine 0.8 Labs (4/11): K 4, creatinine 0.8, LDL 53, HDL 26, LFTs normal Labs (11/91): LDL 74, HDL 27, K 4, creatinine 0.9, LFTs normal, TSH normal Labs (7/12): LDL 87, HDL 33 Labs (9/12): LDL 66, HDL 33, LFTs normal Labs (4/78): LDL 62, HDL 30, K 4.1, creatinine 0.8  Allergies (verified):  1)  ! Celebrex  Past Medical History: 1. Hyperlipidemia 2. Minimal aortic stenosis.  Echocardiogram in August 2009 showed EF 60%, no regional wall motion abnormalities, mild LVH, moderate focal basal septal hypertrophy, and mild diastolic dysfunction.  There was a very mild aortic stenosis with partially fused left and right coronary cusps and some restricted motion of the aortic valve.  The mean gradient, however, across the aortic valve was only 8 mmHg.  There was also mild left atrial enlargement and normal RV size and function.  Minimal  AS on LHC in 7/10.  3. Hypertension. 4. Gastroesophageal reflux disease. 5. BPH. 6. Diverticulosis. 7. Smoked until 7/10. 8. CAD: LHC (7/10) showed totally occluded proximal RCA with very robust left to right collaterals, 50% proximal LAD stenosis, EF 65%.  Medical management.   9. Osteoarthritis 10. Colonic polyps 11. h/o TIA 12. 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  This manifested with slurred speech. 13. History of low back pain.  The patient is status post surgical back fusion. 14. History of cholecystectomy.  Family History: Father alive age 18 w/ CAD & CABG at [facility] in 15-Jul-2007 Mother died age 46 w/ Alzheimer's disease, hx of DJD 1 Sibling- Brother alive & well w/ DJD in his hips  Social History: Married- 3rd marriage, wife= Etta 2 children from 1st marriage, 4 step children Former smoker, 40+yrs of >1ppd, quit 7/10.  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 amLODipine (NORVASC) 10 MG tablet Take 1 tablet (10 mg total) by mouth daily.  30 tablet  11  . aspirin 81 MG EC tablet Take 81 mg by mouth daily.        Marland Kitchen atorvastatin (LIPITOR) 20 MG tablet Take 1 tablet (20 mg total) by mouth daily.  180 tablet  1  . benazepril (LOTENSIN) 20 MG tablet Take 1 tablet (20 mg  total) by mouth daily.  30 tablet  11  . betamethasone dipropionate (DIPROLENE) 0.05 % cream Apply topically 2 (two) times daily.        . Coenzyme Q10 (CO Q-10) 200 MG CAPS Take 1 capsule by mouth daily.    0  . HYDROcodone-acetaminophen (VICODIN) 5-500 MG per tablet TAKE ONE TABLET BY MOUTH THREE TIMES DAILY AS NEEDED FOR PAIN  90 tablet  5  . isosorbide mononitrate (IMDUR) 30 MG 24 hr tablet Take 1 tablet (30 mg total) by mouth at bedtime.  30 tablet  11  . loperamide (IMODIUM A-D) 2 MG tablet Take 2 mg by mouth daily.        . metoprolol  (LOPRESSOR) 50 MG tablet Take 1 tablet (50 mg total) by mouth 2 (two) times daily.  60 tablet  11  . nitroGLYCERIN (NITROSTAT) 0.4 MG SL tablet Place 1 tablet (0.4 mg total) under the tongue every 5 (five) minutes as needed.  25 tablet  1  . Omega-3 Fatty Acids (FISH OIL) 1000 MG CAPS Take 2 capsules by mouth 2 (two) times daily.        Marland Kitchen omeprazole (PRILOSEC) 20 MG capsule Take 1 capsule (20 mg total) by mouth daily.  180 capsule  1  . vitamin B-12 (CYANOCOBALAMIN) 1000 MCG tablet Take 1,000 mcg by mouth daily.          BP 118/70  Pulse 69  Ht 5\' 10"  (1.778 m)  Wt 183 lb 3.2 oz (83.099 kg)  BMI 26.29 kg/m2  SpO2 97% General:  Well developed, well nourished, in no acute distress. Neck:  Neck supple, no JVD. No masses, thyromegaly or abnormal cervical nodes. Lungs:  Clear bilaterally to auscultation and percussion. Heart:  Non-displaced PMI, chest non-tender; regular rate and rhythm, S1, S2 without  rubs or gallops.  2/6 systolic crescendo-decrescendo early peaking murmur at RUSB.  Carotid upstroke normal, no bruit.  Pedals normal pulses. No edema, no varicosities. Abdomen:  Bowel sounds positive; abdomen soft and non-tender without masses, organomegaly, or hernias noted. No hepatosplenomegaly. Extremities:  No clubbing or cyanosis. Neurologic:  Alert and oriented x 3. Psych:  Normal affect.  Assessment/Plan:  AORTIC STENOSIS  Minimal stenosis on last echo. Murmur is stable.  CAD, NATIVE VESSEL  Stable with no ischemic symptoms. Continue ASA, statin, beta blocker, ACEI.  HYPERLIPIDEMIA  Good lipids in 9/13. He is tolerating atorvastatin 20 despite some minor cramping and is ok with continuing it. Continue coenzyme Q 10 200 mg daily.  HYPERTENSION BP is under good control.   I did encourage him to get more exercise and to do low weight/high repetition free weight exercises at the [facility] to help with his muscle strength.   Marca Ancona 01/25/2012 2:20 PM

## 2012-02-28 ENCOUNTER — Telehealth: Payer: Self-pay | Admitting: Pulmonary Disease

## 2012-02-28 NOTE — Telephone Encounter (Signed)
No extension nor last name provided.  Called provided number, hold time x 19-25 minutes.  Fortunately the automated system allowed for a call back number and a recorded call-back name.  Will hold in triage.

## 2012-02-28 NOTE — Telephone Encounter (Signed)
Pt called as well re: same. Wants this faxed back to ins asap. Call pt to confirm @ [phone]. Hazel Sams

## 2012-02-28 NOTE — Telephone Encounter (Signed)
Needs formulary comparison for pt's hydrocodone-acetamiophen, needs to know what meds he's tried and failed in same category can be faxed to [phone].Raylene Everts

## 2012-02-29 NOTE — Telephone Encounter (Signed)
ATC, held x 10 min with no answer WCB

## 2012-02-29 NOTE — Telephone Encounter (Signed)
Spoke with Gavin Pound with [facility]  She states that the vicodin 5/500 is not on pt's formulary The 5/325 is, and I advised we will change to this  She is going to close this case since we are changing dose Spoke with pt and notified of this, and he was fine with this Does not need med at this time I updated MAR so next time we will know to fill the 5/325 strength

## 2012-04-10 ENCOUNTER — Ambulatory Visit: Payer: Medicare Other | Admitting: Pulmonary Disease

## 2012-04-24 ENCOUNTER — Other Ambulatory Visit: Payer: Self-pay | Admitting: Pulmonary Disease

## 2012-04-24 MED ORDER — ATORVASTATIN CALCIUM 20 MG PO TABS: 20.0000 mg | ORAL_TABLET | Freq: Every day | ORAL | Status: DC

## 2012-04-30 ENCOUNTER — Telehealth: Payer: Self-pay | Admitting: Pulmonary Disease

## 2012-04-30 MED ORDER — HYDROCODONE-ACETAMINOPHEN 5-325 MG PO TABS: 0.5000 | ORAL_TABLET | Freq: Three times a day (TID) | ORAL | Status: DC

## 2012-04-30 NOTE — Telephone Encounter (Signed)
Ok per SN---to change the vicodin 5/500 to vicodin 5/325   #90  Take 1/2 to 1 tablet by mouth three times daily as needed for pain.  NOT TO EXCEED THREE PER DAY.  With no refills. thanks

## 2012-04-30 NOTE — Telephone Encounter (Signed)
Please advise if okay to call in norco 5/325 for this pt  Last had the vicodin 5/500 # 90 called in on 08/24/10 Please advise, thanks! Last ov 10/11/11 Next ov 05/19/12

## 2012-04-30 NOTE — Telephone Encounter (Signed)
Rx has been called in. Pt is aware. 

## 2012-05-12 ENCOUNTER — Telehealth: Payer: Self-pay | Admitting: Pulmonary Disease

## 2012-05-12 NOTE — Telephone Encounter (Signed)
I called prim[facility]. Was advised rx was shipped for lipitor 05/10/12. I called and made spouse aware. Nothing further was needed

## 2012-05-19 ENCOUNTER — Other Ambulatory Visit (INDEPENDENT_AMBULATORY_CARE_PROVIDER_SITE_OTHER): Payer: Medicare Other

## 2012-05-19 ENCOUNTER — Encounter: Payer: Self-pay | Admitting: Pulmonary Disease

## 2012-05-19 ENCOUNTER — Ambulatory Visit (INDEPENDENT_AMBULATORY_CARE_PROVIDER_SITE_OTHER): Payer: Medicare Other | Admitting: Pulmonary Disease

## 2012-05-19 VITALS — BP 100/60 | HR 64 | Temp 97.4°F | Ht 70.0 in | Wt 182.0 lb

## 2012-05-19 DIAGNOSIS — I10 Essential (primary) hypertension: Secondary | ICD-10-CM

## 2012-05-19 DIAGNOSIS — K219 Gastro-esophageal reflux disease without esophagitis: Secondary | ICD-10-CM

## 2012-05-19 DIAGNOSIS — M1990 Unspecified osteoarthritis, unspecified site: Secondary | ICD-10-CM

## 2012-05-19 DIAGNOSIS — I6350 Cerebral infarction due to unspecified occlusion or stenosis of unspecified cerebral artery: Secondary | ICD-10-CM

## 2012-05-19 DIAGNOSIS — K5730 Diverticulosis of large intestine without perforation or abscess without bleeding: Secondary | ICD-10-CM

## 2012-05-19 DIAGNOSIS — J45909 Unspecified asthma, uncomplicated: Secondary | ICD-10-CM

## 2012-05-19 DIAGNOSIS — I359 Nonrheumatic aortic valve disorder, unspecified: Secondary | ICD-10-CM

## 2012-05-19 DIAGNOSIS — M545 Low back pain, unspecified: Secondary | ICD-10-CM

## 2012-05-19 DIAGNOSIS — I2510 Atherosclerotic heart disease of native coronary artery without angina pectoris: Secondary | ICD-10-CM

## 2012-05-19 DIAGNOSIS — J309 Allergic rhinitis, unspecified: Secondary | ICD-10-CM

## 2012-05-19 DIAGNOSIS — F411 Generalized anxiety disorder: Secondary | ICD-10-CM

## 2012-05-19 DIAGNOSIS — E785 Hyperlipidemia, unspecified: Secondary | ICD-10-CM

## 2012-05-19 LAB — LIPID PANEL
HDL: 26.9 mg/dL — ABNORMAL LOW (ref >39.00)
LDL Cholesterol: 88 mg/dL (ref 0–99)
Total CHOL/HDL Ratio: 5
VLDL: 24.6 mg/dL (ref 0.0–40.0)

## 2012-05-19 LAB — HEPATIC FUNCTION PANEL
Albumin: 3.9 g/dL (ref 3.5–5.2)
Alkaline Phosphatase: 90 U/L (ref 39–117)
Total Bilirubin: 0.6 mg/dL (ref 0.3–1.2)

## 2012-05-19 LAB — BASIC METABOLIC PANEL
Calcium: 9.3 mg/dL (ref 8.4–10.5)
GFR: 107.89 mL/min (ref >60.00)
Glucose, Bld: 101 mg/dL — ABNORMAL HIGH (ref 70–99)
Sodium: 138 mEq/L (ref 135–145)

## 2012-05-19 NOTE — Progress Notes (Signed)
Subjective:    Patient ID: Brandon [name], male    DOB: 1940-07-06, 72 y.o.   MRN: [id number]  HPI 72 y/o WM here for a follow up visit... he has multiple medical problems as noted below...   ~  September 06, 2010:  58mo ROV & he had labs done 08/08/10 & reviewed w/ the pt>  FLP looks ok on Prav40 but TG still too hi & LDL goal <70 so DrMcLean switched him once again to LIPITOR 20mg /d w CoQ10 & instructed on low carb, wt reducing, low fat diet...    He saw DrMcLean 7/12 for Cards f/u of his HBP, CAD, AS, & Chol> stable on ASA, BBlocker, ACE, & Statin; baseline EKG is NSR, WNL; aching & soreness not from Statin since neither stopping Statin & switching Cres20 to Prav40 has made a difference; asked to switch Prev40 to LIPITOR 20mg  w/ CoQ10 ~200mg /d and f/u FLP later (done 9/12 & improved- see below).    He saw [facility] 3/12 for Rheum f/u of his OA> still c/o pain in all of his joints, takes Nabumetone as needed w/ relief, also notes less "achiness" since off the Crestor; she rec Tylenol more regularly & Voltaren Gel...  ~  March 06, 2011:  78mo ROV & he's had a good 78mo he says; c/o nasal drainage, incr stress w/ [facility] surg for detached retina, some intermit dizziness, & his arthritis pain + LBP evaluated by DrNudelman "I'm eaten up by bone spurs & treat it w/ Relafen"... Requesting 78mo Rx of eligible meds for Brandon [name]'s Drugs...  ~  October 11, 2011:  58mo ROV & Brandon [name] says he is stable- no new complaints or concerns; he has some arthritis in his knees but notes that topical Rx helps; states he's quit smoking- AGAIN- and we discussed staying quit this time; he has been exercising by pushing his mower & denies dyspnea; weight is down 5# to 181# today...     We reviewed prob list, meds, xrays and labs> see below for updates >> CXR 9/13 showed normal heart size, clear lungs x calcif granuloma LLL, NAD... LABS 9/13:  FLP- Chol ok but TG=164 HDL=30;  Chems- wnl;  CBC- wnl;  TSH=0.92;  PSA=2/71;  B12=1179   ~   May 19, 2012:  58mo ROV & Brandon [name] has been doing well- "staying busy" w/ renovations on a building for [employer]; looking forward to [place] vacation soon;  He reports s/p bilat cataract surg by DrLyles- right 12/13 & left 1/14... We reviewed the following medical problems during today's office visit >>     AR, AB> not on regular meds, uses OTC antihist etc; states he's quit smoking cigarettes in 2013 & no recent resp exac...    HBP> on Metop50Bid, Amlod10, Benazepril20; BP= 100/60 & he notes energy is good and he denies CP, palpit, SOB, edema, etc...    CAD> on ASA81 & Imdur30; followed by DrMcLean, seen last 12/13 & reviewed prior cath 2010 w/ prox occluded RCA, good left to right collaterals, 50%proxLAD; doing satis medically w/o recurrent angina; Rec to incr exercise program...    AoStenosis> followed by DrMcLean- hx mild AS w/ partially fused L&R coronary cusps & some restricted motion; mean gradient=35mmHg; no change in meds...    Hyperlipid> on Lip20 (hx muscle aches on Crestor); CoQ10 helps the aching; Labs 4/14 showed TChol 139, TG 123, HDL 27, LDL 88    GI- GERD, Divertics, Polyp, Hems> on Prilosec20; Hx meat impaction, stricture dil 2007; colon  2009 w/ one adenoma & f/u planned 32yrs...    BPH> voiding satis & PSA's have all been wnl...    DJD, LBP> s/p 3 separate back surg by [facility]; on disability, uses Tylenol, vicodin as needed...    Hx brainstem stroke> Hosp 2000 w/ abn MRI but stable since then w/o acute cerebral ischemic symptoms...    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    Hx B12 defic> on orally every day... We reviewed prob list, meds, xrays and labs> see below for updates >>  LABS 4/14:  FLP- at goals on Lip20 x low HDL=27;  Chems- wnl...            Problem List:  ALLERGIC RHINITIS (ICD-477.9) - he uses OTC antihistamines and nasal saline spray as needed...  ASTHMATIC BRONCHITIS >> not on regular  meds... ~  CXR 9/13 showed normal heart size, clear lungs x calcif granuloma LLL, NAD... ~  9/13: pt states he weaned down off cigs from 1.5ppd to quit over the last 95mo, now using E-cig & advised to cut that down as well...  HYPERTENSION (ICD-401.9) - controlled on METOPROLOL 50mg Bid, AMLODIPINE 10mg /d, BENAZEPRIL 20mg /d...  ~  CXR 7/09 showed low lung vol, clear, norm heart size, NAD.Marland Kitchen. ~  8/12:  BP= 136/74 today & OK per pt at home; tolerates meds well and denies HA, fatigue, visual changes, CP, palipit, dizziness, syncope, dyspnea, edema, etc... ~  1/13:  BP= 118/68 & he remains asymptomatic w/o CP, palpit, SOB, edema, etc... ~  9/13:  BP= 114/62 & he remains asymptomatic...  CAD, NATIVE VESSEL (ICD-414.01) - followed by [facility] for Cards... on ASA 81mg /d, + IMDUR 30mg /d... ~  Myoview 7/10 showed mild infer ischemia, ST abn as well, EF=69% w/ norm wall motion. ~  Cath 7/10 showed total occlusion of prox RCA, w/ left to right collaterals (from distal LAD); 50% LAD stenosis & small CIRC w/ 40% lesion- MedRx per Cards. ~  He denies CP, palpit, dizzy, syncope, SOB, edema, etc; rec to incr exercise program... ~  EKG 7/12 showed NSR, rate66, WNL, NAD... ~  He notes that his father is 54 & had a triple bypass at 57; now showing signs of early dementia. ~  EKG 3/13 showed NSR, rate64, wnl, NAD... ~  He saw Cards, DrMcLean 3/13> HBP, CAD, Chol> last cath 7/10 w/ proxRCA occlusion, 50%proxLAD, lots of collaterals; med Rx- doing well...  AORTIC STENOSIS (ICD-424.1) - eval by DrMcLean for Cards 9/09... 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 ~  baseline EKG shows NSR, WNL... 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 ~  2DEcho 7/10 showed mod conc LVH, EF=55-60% & norm wall motion, mild AS, mild LA dil & lipomatous hypertrophy of septum.  HYPERLIPIDEMIA (ICD-272.4) - now on LIPITOR 40mg - 1/2 tab daily + Fish Oil supplements... DrMcLean has made freq adjustment  to his meds (leg cramps on Crestor). ~  FLP 11/08 showed TChol 162, TG 153, HDL 30, LDL 101...needs better diet, may need incr dose... ~  FLP 7/09 showed TChol 146, Tg 188, HDL 27, LDL 82 ~  FLP 4/10 showed TChol 144, TG 205, HDL 29, LDL 81 ~  FLP 10/10 showed TChol 133, TG 187, HDL 28, LDL 68... changed to Crestor40- 1/2 tab daily by Cards. ~  FLP 4/11 showed TChol 115, TG 181, HDL 26, LDL 53 ~  FLP 12/11 on Cres20 showed TChol 135, TG 169, HDL 27, LDL 74... Cres20 stopped due to aching in his joints... ~  FLP 7/12 on Prav40 showed TChol 156, TG 180, HDL 33, LDL 87... DrMcLean changed him to Lip20 + CoQ10. ~  FLP 9/12 on Lip20+CoQ10 showed TChol 129, TG 149, HDL 33, LDL 66... Continue Lip20. ~  FLP 3/13 on Lip20 showed TChol 122, TG 132, HDL 33, LDL 63 ~  FLP 9/13 on Lip20 showed TChol 125, TG 164, HDL 30, LDL 62... Needs better low fat diet & incr exercise. ~  FLP 4/14 on Lip20 showed  TChol 139, TG 123, HDL 27, LDL 88   GERD (ICD-530.81) - on OMEPRAZOLE 20mg /d... last EGD was urgent procedure 12/07 by DrBrodie w/ meat impaction- 3cmHH, stricture- subseq dilated w/ Savary dilators and no trouble since then...  DIVERTICULOSIS OF COLON (ICD-562.10) - colonoscopy 7/00 by DrStark showed divertics, hems...  ~  last colonoscopy 9/09 by drStark showed divertics, hems, 5mm adenomatous colon polyp removed... f/u 34yrs.  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  BENIGN PROSTATIC HYPERTROPHY, HX OF (ICD-V13.8) - eval by DrGrapey in 2006 w/ BPH, mild outlet symptoms, ED, and hematospermia...  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 he tells me he has been on disability due to his back- per [facility]... currently using TYLENOL + VICODIN (up to 2 per day) & RELAFEN 500mg Bid as needed for pain... ~  3/12:  Rheum f/u DrHawks & she endorsed the use of Tylenol + Voltaren gel for his pain; he also takes Glucosamine & Chondroitin fo his joints. ~  11/12: Lumbar CT per DrNudelman> severe atherosclerosis & infrarenal AAA ~3cm; prev surg w/ ray cage fusion grafts, bulging discs, etc (see report). ~  11/12: MRI Lumbar spine per DrNudelman> bulging discs 7 scarring from prev surg...  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 on ASA 325mg /d + NO Smoking + Chol Rx + HBP Rx etc... no known recurrence...  ANXIETY (ICD-300.00) - prev on Xanax as needed for nerves, this med is no longer on his list (?uses wife's Rx)... stress w/ wife & insomnia...  BORDERLINE B12 DEFICIENCY - B12 level 6/11 at [facility] office = [phone])... rec to start oral B12 supplementation w/ daily. ~  Labs 9/13 showed B12 level = 1179... Ok to decr the B12 supplement.  Health Maintenance - he has been a mod smoker and quits on & off... currently off x 6months and hopefully will remain off...   Past Surgical History  Procedure Laterality Date  . Cholecystectomy  1994  . L4-5 discectomy  4/98    Dr. Newell Coral   . Lumbar laminectomy  9/04    and microdiscectomy. L3-4. Dr. Newell Coral  . Lumbar decompression and ray cage  8/98    L4-5 Dr. Jule Ser  . Cataract surgery  01/2012/02/2012    both eyes/lens implanted  Outpatient Encounter Prescriptions as of 05/19/2012  Medication Sig Dispense Refill  . amLODipine (NORVASC) 10 MG tablet Take 1 tablet (10 mg total) by mouth daily.  30 tablet  11  . aspirin 81 MG EC tablet Take 81 mg by mouth daily.        Marland Kitchen atorvastatin (LIPITOR)  20 MG tablet Take 1 tablet (20 mg total) by mouth daily.  180 tablet  3  . benazepril (LOTENSIN) 20 MG tablet Take 1 tablet (20 mg total) by mouth daily.  30 tablet  11  . betamethasone dipropionate (DIPROLENE) 0.05 % cream Apply topically 2 (two) times daily.        . Coenzyme Q10 (CO Q-10) 200 MG CAPS Take 1 capsule by mouth daily.    0  . HYDROcodone-acetaminophen (NORCO/VICODIN) 5-325 MG per tablet Take 0.5-1 tablets by mouth 3 (three) times daily. DO NOT EXCEED THREE TABLETS PER DAY  90 tablet  0  . isosorbide mononitrate (IMDUR) 30 MG 24 hr tablet Take 1 tablet (30 mg total) by mouth at bedtime.  30 tablet  11  . loperamide (IMODIUM A-D) 2 MG tablet Take 2 mg by mouth daily.        . metoprolol (LOPRESSOR) 50 MG tablet Take 1 tablet (50 mg total) by mouth 2 (two) times daily.  60 tablet  11  . nitroGLYCERIN (NITROSTAT) 0.4 MG SL tablet Place 1 tablet (0.4 mg total) under the tongue every 5 (five) minutes as needed.  25 tablet  1  . Omega-3 Fatty Acids (FISH OIL) 1000 MG CAPS Take 2 capsules by mouth 2 (two) times daily.        Marland Kitchen omeprazole (PRILOSEC) 20 MG capsule Take 1 capsule (20 mg total) by mouth daily.  180 capsule  1  . vitamin B-12 (CYANOCOBALAMIN) 1000 MCG tablet Take 1,000 mcg by mouth daily.         No facility-administered encounter medications on file as of 05/19/2012.    Allergies  Allergen Reactions  . Celecoxib     REACTION: hives and itching    Current Medications, Allergies, Past Medical History, Past Surgical History, Family History, and Social History were reviewed in [employer] record.    Review of Systems         See HPI - all other systems neg except as noted...  The patient complains of dyspnea on exertion and difficulty walking.  The patient denies anorexia, fever, weight loss, weight gain, vision loss, decreased hearing, hoarseness, chest pain, syncope, peripheral edema, prolonged cough, headaches, hemoptysis, abdominal pain, melena,  hematochezia, severe indigestion/heartburn, hematuria, incontinence, muscle weakness, suspicious skin lesions, transient blindness, depression, unusual weight change, abnormal bleeding, enlarged lymph nodes, and angioedema.   Objective:   Physical Exam     WD, WN, 72 y/o WM in NAD... GENERAL:  Alert & oriented; pleasant & cooperative... HEENT:  [place]/AT, EOM-wnl, PERRLA, EACs-clear, TMs-wnl, NOSE-clear, THROAT-clear & wnl. NECK:  Supple w/ fairROM; no JVD; normal carotid impulses w/o bruits; no thyromegaly or nodules palpated; no lymphadenopathy. CHEST:  Clear to P & A; without wheezes/ rales/ or rhonchi heard... HEART:  Regular Rhythm; gr 1-2 sys murmur at base without rubs or gallops detected... ABDOMEN:  Soft & nontender; normal bowel sounds; no organomegaly or masses palpated... EXT: without deformities, mild arthritic changes; no varicose veins/ +venous insuffic/ no edema. NEURO:  CN's intact; motor testing normal; sensory testing normal; gait normal & balance OK. DERM:  No lesions noted; no rash etc...  RADIOLOGY  DATA:  Reviewed in the EPIC EMR & discussed w/ the patient...  LABORATORY DATA:  Reviewed in the EPIC EMR & discussed w/ the patient...   Assessment & Plan:    HBP>  Controlled on Metoprolol, Amlodipine, Benazepril; continue same...  CAD>  Followed by DrMcLean & stable on ASA, BBlocker, ACE, Statin combo; no angina, needs incr activity & diet efforts, get wt down!  AS>  Followed by DrMcLean, murmur w/o change, no symptoms, continue observation...  Peripheral vasc dis w/ ~3cmAAA>  Seen on CT Abd 11/12 and he will need f/u Abd Ao ultrasound later...  HYPERLIPID>  Changed to Lipitor 20mg  + CoQ10 per Cards; encouraged to take regularly & get on low fat diet!!!  GERD>  Stable on Omep20mg /d...  Divertics, Polyps>  Up to date on colon screening from  DrStark...  BPH>  Recent PSA= 2.71 & we are following...  DJD>  Followed by [facility] & her notes are reviewed...  Hx  Brainstem stroke>  Stable on ASA w/o any cerebral ischemic symptoms...  Other medical problems as noted...   Patient's Medications  New Prescriptions   No medications on file  Previous Medications   AMLODIPINE (NORVASC) 10 MG TABLET    Take 1 tablet (10 mg total) by mouth daily.   ASPIRIN 81 MG EC TABLET    Take 81 mg by mouth daily.     ATORVASTATIN (LIPITOR) 20 MG TABLET    Take 1 tablet (20 mg total) by mouth daily.   BENAZEPRIL (LOTENSIN) 20 MG TABLET    Take 1 tablet (20 mg total) by mouth daily.   BETAMETHASONE DIPROPIONATE (DIPROLENE) 0.05 % CREAM    Apply topically 2 (two) times daily.     COENZYME Q10 (CO Q-10) 200 MG CAPS    Take 1 capsule by mouth daily.   HYDROCODONE-ACETAMINOPHEN (NORCO/VICODIN) 5-325 MG PER TABLET    Take 0.5-1 tablets by mouth 3 (three) times daily. DO NOT EXCEED THREE TABLETS PER DAY   ISOSORBIDE MONONITRATE (IMDUR) 30 MG 24 HR TABLET    Take 1 tablet (30 mg total) by mouth at bedtime.   LOPERAMIDE (IMODIUM A-D) 2 MG TABLET    Take 2 mg by mouth daily.     METOPROLOL (LOPRESSOR) 50 MG TABLET    Take 1 tablet (50 mg total) by mouth 2 (two) times daily.   NITROGLYCERIN (NITROSTAT) 0.4 MG SL TABLET    Place 1 tablet (0.4 mg total) under the tongue every 5 (five) minutes as needed.   OMEGA-3 FATTY ACIDS (FISH OIL) 1000 MG CAPS    Take 2 capsules by mouth 2 (two) times daily.     OMEPRAZOLE (PRILOSEC) 20 MG CAPSULE    Take 1 capsule (20 mg total) by mouth daily.   VITAMIN B-12 (CYANOCOBALAMIN) 1000 MCG TABLET    Take 1,000 mcg by mouth daily.    Modified Medications   No medications on file  Discontinued Medications   No medications on file

## 2012-05-19 NOTE — Patient Instructions (Addendum)
Today we updated your med list in our EPIC system...    Continue your current medications the same...  Today we did your follow up Blood work...    We will contact you w/ the results when available...   Keep up the good work w/ diet & exercise...  Call for any questions...  Let's plan a follow up visit in 89mo- sooner if needed for problems.Marland KitchenMarland Kitchen

## 2012-06-16 ENCOUNTER — Other Ambulatory Visit: Payer: Self-pay | Admitting: Pulmonary Disease

## 2012-06-18 ENCOUNTER — Telehealth: Payer: Self-pay | Admitting: Pulmonary Disease

## 2012-06-18 MED ORDER — HYDROCODONE-ACETAMINOPHEN 5-325 MG PO TABS: 0.5000 | ORAL_TABLET | Freq: Three times a day (TID) | ORAL | Status: DC

## 2012-06-18 MED ORDER — BENAZEPRIL HCL 20 MG PO TABS: 20.0000 mg | ORAL_TABLET | Freq: Every day | ORAL | Status: DC

## 2012-06-18 NOTE — Telephone Encounter (Signed)
Pt called as well re: same. Please call him when this has been taken care of "asap". [phone]. Hazel Sams

## 2012-06-18 NOTE — Telephone Encounter (Signed)
Pt last seen 05/09/12 Last filled rx for norco 04/30/12 Per Leigh okay to refill x 1 only Lotensin also refilled Pt aware

## 2012-07-28 ENCOUNTER — Ambulatory Visit (INDEPENDENT_AMBULATORY_CARE_PROVIDER_SITE_OTHER): Payer: Medicare Other | Admitting: Cardiology

## 2012-07-28 ENCOUNTER — Encounter: Payer: Self-pay | Admitting: Cardiology

## 2012-07-28 VITALS — BP 124/66 | HR 66 | Wt 179.0 lb

## 2012-07-28 DIAGNOSIS — I10 Essential (primary) hypertension: Secondary | ICD-10-CM

## 2012-07-28 DIAGNOSIS — I359 Nonrheumatic aortic valve disorder, unspecified: Secondary | ICD-10-CM

## 2012-07-28 DIAGNOSIS — I2510 Atherosclerotic heart disease of native coronary artery without angina pectoris: Secondary | ICD-10-CM

## 2012-07-28 DIAGNOSIS — E785 Hyperlipidemia, unspecified: Secondary | ICD-10-CM

## 2012-07-28 MED ORDER — ATORVASTATIN CALCIUM 40 MG PO TABS: 40.0000 mg | ORAL_TABLET | Freq: Every day | ORAL | Status: DC

## 2012-07-28 NOTE — Progress Notes (Signed)
Patient ID: Brandon [name], male   DOB: 02-16-1940, 72 y.o.   MRN: [id number] PCP: Dr. Kriste Basque  72 yo with history of CAD, HTN, and hyperlipidemia presents for followup.  Patient had described some exertional chest pain and myoview showed a partially reversible inferior defect, so LHC was done in 7/10.  This showed that his proximal RCA was totally occluded.  There were very robust left to right collaterals.  There was also a 50% proximal LAD stenosis.  The patient was managed medically.  Since that time, he has been doing well symptomatically.  He has had no further exertional chest pain.  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  He has been tolerating atorvastatin reasonably well with no muscle pain (better than Crestor).   Weight is down 4 lbs.   Labs (12/10): HDL 28, LDL 70, K 4.3, creatinine 0.8 Labs (4/11): K 4, creatinine 0.8, LDL 53, HDL 26, LFTs normal Labs (78/29): LDL 74, HDL 27, K 4, creatinine 0.9, LFTs normal, TSH normal Labs (7/12): LDL 87, HDL 33 Labs (9/12): LDL 66, HDL 33, LFTs normal Labs (5/62): LDL 62, HDL 30, K 4.1, creatinine 0.8 Labs (4/14): LDL 88, HDL 27, K 4.4, creatinine 0.8  Allergies (verified):  1)  ! Celebrex  Past Medical History: 1. Hyperlipidemia 2. Minimal aortic stenosis.  Echocardiogram in August 2009 showed EF 60%, no regional wall motion abnormalities, mild LVH, moderate focal basal septal hypertrophy, and mild diastolic dysfunction.  There was a very mild aortic stenosis with partially fused left and right coronary cusps and some restricted motion of the aortic valve.  The mean gradient, however, across the aortic valve was only 8 mmHg.  There was also mild left atrial enlargement and normal RV size and function.  Minimal AS on LHC in 7/10.  3. Hypertension. 4. Gastroesophageal reflux disease. 5. BPH. 6.  Diverticulosis. 7. Smoked until 7/10. 8. CAD: LHC (7/10) showed totally occluded proximal RCA with very robust left to right collaterals, 50% proximal LAD stenosis, EF 65%.  Medical management.   9. Osteoarthritis 10. Colonic polyps 11. h/o TIA 12. 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  This manifested with slurred speech. 13. History of low back pain.  The patient is status post surgical back fusion. 14. History of cholecystectomy. 15. Cataract surgery  Family History: Father alive age 64 w/ CAD & CABG at [facility] in 2007/07/05 Mother died age 41 w/ Alzheimer's disease, hx of DJD 1 Sibling- Brother alive & well w/ DJD in his hips  Social History: Married- 3rd marriage, wife= Etta 2 children from 1st marriage, 4 step children Former smoker, 40+yrs of >1ppd, quit 7/10.  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    Current Outpatient Prescriptions  Medication Sig Dispense Refill  . amLODipine (NORVASC) 10 MG tablet Take 1 tablet (10 mg total) by mouth daily.  30 tablet  11  . aspirin 81 MG EC tablet Take 81 mg by mouth daily.        . benazepril (LOTENSIN) 20 MG tablet Take 1 tablet (20 mg total) by mouth daily.  30 tablet  11  . betamethasone dipropionate (DIPROLENE) 0.05 % cream Apply topically 2 (two) times daily.        Marland Kitchen  Coenzyme Q10 (CO Q-10) 200 MG CAPS Take 1 capsule by mouth daily.    0  . HYDROcodone-acetaminophen (NORCO/VICODIN) 5-325 MG per tablet Take 0.5-1 tablets by mouth 3 (three) times daily. DO NOT EXCEED THREE TABLETS PER DAY  90 tablet  0  . isosorbide mononitrate (IMDUR) 30 MG 24 hr tablet Take 1 tablet (30 mg total) by mouth at bedtime.  30 tablet  11  . loperamide (IMODIUM A-D) 2 MG tablet Take 2 mg by mouth daily.        . metoprolol (LOPRESSOR) 50 MG tablet Take 1 tablet (50 mg total) by mouth 2 (two)  times daily.  60 tablet  11  . nitroGLYCERIN (NITROSTAT) 0.4 MG SL tablet Place 1 tablet (0.4 mg total) under the tongue every 5 (five) minutes as needed.  25 tablet  1  . Omega-3 Fatty Acids (FISH OIL) 1000 MG CAPS Take 2 capsules by mouth 2 (two) times daily.        Marland Kitchen omeprazole (PRILOSEC) 20 MG capsule Take 1 capsule (20 mg total) by mouth daily.  180 capsule  1  . vitamin B-12 (CYANOCOBALAMIN) 1000 MCG tablet Take 1,000 mcg by mouth daily.        Marland Kitchen atorvastatin (LIPITOR) 40 MG tablet Take 1 tablet (40 mg total) by mouth daily.  30 tablet  3  . atorvastatin (LIPITOR) 40 MG tablet Take 1 tablet (40 mg total) by mouth daily.  90 tablet  3   No current facility-administered medications for this visit.    BP 124/66  Pulse 66  Wt 179 lb (81.194 kg)  BMI 25.68 kg/m2 General:  Well developed, well nourished, in no acute distress. Neck:  Neck supple, no JVD. No masses, thyromegaly or abnormal cervical nodes. Lungs:  Clear bilaterally to auscultation and percussion. Heart:  Non-displaced PMI, chest non-tender; regular rate and rhythm, S1, S2 without  rubs or gallops.  2/6 systolic crescendo-decrescendo early peaking murmur at RUSB.  Carotid upstroke normal, no bruit.  Pedals normal pulses. No edema, no varicosities. Abdomen:  Bowel sounds positive; abdomen soft and non-tender without masses, organomegaly, or hernias noted. No hepatosplenomegaly. Extremities:  No clubbing or cyanosis. Neurologic:  Alert and oriented x 3. Psych:  Normal affect.  Assessment/Plan:  AORTIC STENOSIS  It has been several years since last echo.  Will get echocardiogram to assess for progression of AS.  CAD, NATIVE VESSEL  Stable with no ischemic symptoms. Continue ASA, statin, beta blocker, ACEI.  HYPERLIPIDEMIA  I am going to increase atorvastatin to 40 mg daily to try to get LDL a bit lower.  He has tolerated atorvastatin without problems so far.  I will check lipids/LFTs in 2 months.  HYPERTENSION BP is under  good control.   Marca Ancona 07/28/2012 11:49 PM

## 2012-07-28 NOTE — Patient Instructions (Addendum)
Increase atorvastatin to 40mg  daily.   Your physician has requested that you have an echocardiogram. Echocardiography is a painless test that uses sound waves to create images of your heart. It provides your doctor with information about the size and shape of your heart and how well your heart's chambers and valves are working. This procedure takes approximately one hour. There are no restrictions for this procedure.     Your physician recommends that you return for a FASTING lipid profile /liver profile in 2 months.    Your physician wants you to follow-up in: 6 months with Dr Shirlee Latch. (December 2014). You will receive a reminder letter in the mail two months in advance. If you don't receive a letter, please call our office to schedule the follow-up appointment.

## 2012-08-12 ENCOUNTER — Other Ambulatory Visit: Payer: Self-pay | Admitting: Pulmonary Disease

## 2012-08-19 ENCOUNTER — Telehealth: Payer: Self-pay | Admitting: Pulmonary Disease

## 2012-08-19 NOTE — Telephone Encounter (Signed)
Form has been signed by SN and this form has been placed in the mail to the pt per pts request. i have called and lmom to make the pt aware that this has been done.

## 2012-08-20 ENCOUNTER — Ambulatory Visit (HOSPITAL_COMMUNITY): Payer: Medicare Other | Attending: Cardiology | Admitting: Radiology

## 2012-08-20 DIAGNOSIS — I2510 Atherosclerotic heart disease of native coronary artery without angina pectoris: Secondary | ICD-10-CM

## 2012-08-20 DIAGNOSIS — I079 Rheumatic tricuspid valve disease, unspecified: Secondary | ICD-10-CM | POA: Insufficient documentation

## 2012-08-20 DIAGNOSIS — I359 Nonrheumatic aortic valve disorder, unspecified: Secondary | ICD-10-CM | POA: Insufficient documentation

## 2012-08-20 NOTE — Progress Notes (Signed)
Echocardiogram performed.  

## 2012-09-10 ENCOUNTER — Encounter: Payer: Self-pay | Admitting: Gastroenterology

## 2012-09-22 ENCOUNTER — Other Ambulatory Visit: Payer: Medicare Other

## 2012-09-24 ENCOUNTER — Other Ambulatory Visit: Payer: Self-pay

## 2012-10-02 ENCOUNTER — Other Ambulatory Visit (INDEPENDENT_AMBULATORY_CARE_PROVIDER_SITE_OTHER): Payer: Medicare Other

## 2012-10-02 DIAGNOSIS — I2510 Atherosclerotic heart disease of native coronary artery without angina pectoris: Secondary | ICD-10-CM

## 2012-10-02 DIAGNOSIS — I359 Nonrheumatic aortic valve disorder, unspecified: Secondary | ICD-10-CM

## 2012-10-03 LAB — HEPATIC FUNCTION PANEL
Albumin: 4 g/dL (ref 3.5–5.2)
Alkaline Phosphatase: 89 U/L (ref 39–117)
Bilirubin, Direct: 0.1 mg/dL (ref 0.0–0.3)

## 2012-10-03 LAB — LIPID PANEL
LDL Cholesterol: 72 mg/dL (ref 0–99)
Total CHOL/HDL Ratio: 4
Triglycerides: 117 mg/dL (ref 0.0–149.0)
VLDL: 23.4 mg/dL (ref 0.0–40.0)

## 2012-10-20 ENCOUNTER — Other Ambulatory Visit: Payer: Self-pay | Admitting: Pulmonary Disease

## 2012-10-28 ENCOUNTER — Other Ambulatory Visit: Payer: Self-pay | Admitting: Pulmonary Disease

## 2012-10-29 ENCOUNTER — Other Ambulatory Visit: Payer: Self-pay | Admitting: Pulmonary Disease

## 2013-01-27 ENCOUNTER — Other Ambulatory Visit (INDEPENDENT_AMBULATORY_CARE_PROVIDER_SITE_OTHER): Payer: Medicare Other

## 2013-01-27 ENCOUNTER — Ambulatory Visit (INDEPENDENT_AMBULATORY_CARE_PROVIDER_SITE_OTHER): Payer: Medicare Other | Admitting: Pulmonary Disease

## 2013-01-27 ENCOUNTER — Ambulatory Visit (INDEPENDENT_AMBULATORY_CARE_PROVIDER_SITE_OTHER)
Admission: RE | Admit: 2013-01-27 | Discharge: 2013-01-27 | Disposition: A | Discharge status: Home / Self Care | Payer: Medicare Other | Source: Ambulatory Visit | Attending: Pulmonary Disease | Admitting: Pulmonary Disease

## 2013-01-27 ENCOUNTER — Encounter: Payer: Self-pay | Admitting: Pulmonary Disease

## 2013-01-27 VITALS — BP 140/82 | HR 67 | Temp 97.7°F | Ht 70.0 in | Wt 187.8 lb

## 2013-01-27 DIAGNOSIS — J45909 Unspecified asthma, uncomplicated: Secondary | ICD-10-CM

## 2013-01-27 DIAGNOSIS — I10 Essential (primary) hypertension: Secondary | ICD-10-CM

## 2013-01-27 DIAGNOSIS — I359 Nonrheumatic aortic valve disorder, unspecified: Secondary | ICD-10-CM

## 2013-01-27 DIAGNOSIS — Z87898 Personal history of other specified conditions: Secondary | ICD-10-CM

## 2013-01-27 DIAGNOSIS — K591 Functional diarrhea: Secondary | ICD-10-CM

## 2013-01-27 DIAGNOSIS — E785 Hyperlipidemia, unspecified: Secondary | ICD-10-CM

## 2013-01-27 DIAGNOSIS — F411 Generalized anxiety disorder: Secondary | ICD-10-CM

## 2013-01-27 DIAGNOSIS — I6350 Cerebral infarction due to unspecified occlusion or stenosis of unspecified cerebral artery: Secondary | ICD-10-CM

## 2013-01-27 DIAGNOSIS — M545 Low back pain, unspecified: Secondary | ICD-10-CM

## 2013-01-27 DIAGNOSIS — K5730 Diverticulosis of large intestine without perforation or abscess without bleeding: Secondary | ICD-10-CM

## 2013-01-27 DIAGNOSIS — I2510 Atherosclerotic heart disease of native coronary artery without angina pectoris: Secondary | ICD-10-CM

## 2013-01-27 DIAGNOSIS — K219 Gastro-esophageal reflux disease without esophagitis: Secondary | ICD-10-CM

## 2013-01-27 DIAGNOSIS — L408 Other psoriasis: Secondary | ICD-10-CM

## 2013-01-27 DIAGNOSIS — M1990 Unspecified osteoarthritis, unspecified site: Secondary | ICD-10-CM

## 2013-01-27 LAB — BASIC METABOLIC PANEL
Calcium: 9.1 mg/dL (ref 8.4–10.5)
Chloride: 104 mEq/L (ref 96–112)
GFR: 112.79 mL/min (ref >60.00)
Glucose, Bld: 111 mg/dL — ABNORMAL HIGH (ref 70–99)
Potassium: 4.4 mEq/L (ref 3.5–5.1)
Sodium: 139 mEq/L (ref 135–145)

## 2013-01-27 LAB — CBC WITH DIFFERENTIAL/PLATELET
Basophils Absolute: 0 10*3/uL (ref 0.0–0.1)
Eosinophils Absolute: 0.3 10*3/uL (ref 0.0–0.7)
Lymphocytes Relative: 25.3 % (ref 12.0–46.0)
Lymphs Abs: 2.2 10*3/uL (ref 0.7–4.0)
MCHC: 33.3 g/dL (ref 30.0–36.0)
Monocytes Relative: 6.1 % (ref 3.0–12.0)
Neutrophils Relative %: 64.9 % (ref 43.0–77.0)
Platelets: 196 10*3/uL (ref 150.0–400.0)
RDW: 13.6 % (ref 11.5–14.6)

## 2013-01-27 LAB — TSH: TSH: 1.08 u[IU]/mL (ref 0.35–5.50)

## 2013-01-27 LAB — PSA: PSA: 4.95 ng/mL — ABNORMAL HIGH (ref 0.10–4.00)

## 2013-01-27 MED ORDER — OMEPRAZOLE 20 MG PO CPDR: 20.0000 mg | DELAYED_RELEASE_CAPSULE | Freq: Every day | ORAL | Status: DC

## 2013-01-27 MED ORDER — HYDROCODONE-ACETAMINOPHEN 5-325 MG PO TABS: ORAL_TABLET | ORAL | Status: DC

## 2013-01-27 NOTE — Progress Notes (Signed)
Subjective:    Patient ID: Brandon [name], male    DOB: 16-Oct-1940, 72 y.o.   MRN: [id number]  HPI 72 y/o WM here for a follow up visit... he has multiple medical problems as noted below...   ~  October 11, 2011:  224mo ROV & Plummer says he is stable- no new complaints or concerns; he has some arthritis in his knees but notes that topical Rx helps; states he's quit smoking- AGAIN- and we discussed staying quit this time; he has been exercising by pushing his mower & denies dyspnea; weight is down 5# to 181# today...     We reviewed prob list, meds, xrays and labs> see below for updates >> CXR 9/13 showed normal heart size, clear lungs x calcif granuloma LLL, NAD... LABS 9/13:  FLP- Chol ok but TG=164 HDL=30;  Chems- wnl;  CBC- wnl;  TSH=0.92;  PSA=2/71;  B12=1179   ~  May 19, 2012:  224mo ROV & Jahvon has been doing well- "staying busy" w/ renovations on a building for [employer]; looking forward to a [place] vacation soon;  He reports s/p bilat cataract surg by DrLyles- right 12/13 & left 1/14... We reviewed the following medical problems during today's office visit >>     AR, AB> not on regular meds, uses OTC antihist etc; states he's quit smoking cigarettes in 2013 & no recent resp exac...    HBP> on Metop50Bid, Amlod10, Benazepril20; BP= 100/60 & he notes energy is good and he denies CP, palpit, SOB, edema, etc...    CAD> on ASA81 & Imdur30; followed by DrMcLean, seen last 12/13 & reviewed prior cath 2010 w/ prox occluded RCA, good left to right collaterals, 50%proxLAD; doing satis medically w/o recurrent angina; Rec to incr exercise program...    AoStenosis> followed by DrMcLean- hx mild AS w/ partially fused L&R coronary cusps & some restricted motion; mean gradient=4mmHg; no change in meds...    Hyperlipid> on Lip20 (hx muscle aches on Crestor); CoQ10 helps the aching; Labs 4/14 showed TChol 139, TG 123, HDL 27, LDL 88    GI- GERD, Divertics, Polyp, Hems> on Prilosec20; Hx meat impaction,  stricture dil 2007; colon 2009 w/ one adenoma & f/u planned 68yrs...    BPH> voiding satis & PSA's have all been wnl...    DJD, LBP> s/p 3 separate back surg by [facility]; on disability, uses Tylenol, vicodin as needed...    Hx brainstem stroke> Hosp 2000 w/ abn MRI but stable since then w/o acute cerebral ischemic symptoms...    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    Hx B12 defic> on orally every day... We reviewed prob list, meds, xrays and labs> see below for updates >>  LABS 4/14:  FLP- at goals on Lip20 x low HDL=27;  Chems- wnl...   ~  January 27, 2013:  67mo ROV & Brandon [name] has been under some incr stress w/ 1 y/o mother having valve surg at [facility] recently; He saw DrMcLean for Cards 6/14 w/ 2DEcho showing mild AS; We reviewed the following medical problems during today's office visit >>     AR, AB> not on regular meds, uses OTC antihist prn; states he's quit smoking cigarettes in 2013 & no recent resp exac...    HBP> on Metop50Bid, Amlod10, Benazepril20; BP= 140/82 & he notes energy is good and he denies CP, palpit, SOB, edema, etc...    CAD> on ASA81 & Imdur30; followed  by [facility], seen last 6/14 & reviewed prior cath 2010 w/ prox occluded RCA, good left to right collaterals, 50%proxLAD; doing satis medically w/o recurrent angina; Rec to incr exercise program & he increased Atorva40...    AoStenosis> followed by DrMcLean- hx mild AS w/ partially fused L&R coronary cusps & some restricted motion; mean gradient=15mmHg; no change in meds; 2DEcho 7/14 w/ mild LVH, norm LVF w/ EF=60-65%, Gr1DD, mod calcif AoV leaflets & mild stenosis...    Hyperlipid> on Lip40 now (hx muscle aches on Crestor); CoQ10 helps the aching; Labs 8/14 showed TChol 128, TG 117, HDL 32, LDL 72    GI- GERD, Divertics, Polyp, Hems> on Prilosec20; Hx meat impaction, stricture dil 2007; colon 2009 w/ one adenoma & f/u planned 59yrs...    BPH> he notes some  voiding irregularity; Labs today showed PSA=4.95 & we will refer to Urology for further eval...    DJD, LBP> s/p 3 separate back surg by [facility]; on disability, uses Tylenol, Vicodin as needed...    Hx brainstem stroke> Hosp 2000 w/ abn MRI but stable since then w/o acute cerebral ischemic symptoms on ASA81...    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    Hx B12 defic> on orally every day... We reviewed prob list, meds, xrays and labs> see below for updates >> he had the 2014 Flu vaccine in Sept... CXR 12/14 showed normal heart size, clear lungs, NAD... LABS 12/14:  Chems- wnl x BS=111;  CBC- wnl;  TSH=1.08;  PSA=4.95 & we will refer to Urology... FLP 8/14 showed improved numbers...            Problem List:  ALLERGIC RHINITIS (ICD-477.9) - he uses OTC antihistamines and nasal saline spray as needed...  ASTHMATIC BRONCHITIS >> not on regular meds... ~  CXR 9/13 showed normal heart size, clear lungs x calcif granuloma LLL, NAD... ~  9/13: pt states he weaned down off cigs from 1.5ppd to quit over the last 32mo, now using E-cig & advised to cut that down as well...  HYPERTENSION (ICD-401.9) - controlled on METOPROLOL 50mg Bid, AMLODIPINE 10mg /d, BENAZEPRIL 20mg /d...  ~  CXR 7/09 showed low lung vol, clear, norm heart size, NAD.Marland Kitchen. ~  8/12:  BP= 136/74 today & OK per pt at home; tolerates meds well and denies HA, fatigue, visual changes, CP, palipit, dizziness, syncope, dyspnea, edema, etc... ~  1/13:  BP= 118/68 & he remains asymptomatic w/o CP, palpit, SOB, edema, etc... ~  9/13:  BP= 114/62 & he remains asymptomatic... ~  4/14: on Metop50Bid, Amlod10, Benazepril20; BP= 100/60 & he notes energy is good and he denies CP, palpit, SOB, edema, etc. ~  12/14: on Metop50Bid, Amlod10, Benazepril20; BP= 140/82 & he remains largely asymptomatic.  CAD, NATIVE VESSEL (ICD-414.01) - followed by [facility] for Cards... on ASA 81mg /d, +  IMDUR 30mg /d... ~  Myoview 7/10 showed mild infer ischemia, ST abn as well, EF=69% w/ norm wall motion. ~  Cath 7/10 showed total occlusion of prox RCA, w/ left to right collaterals (from distal LAD); 50% LAD stenosis & small CIRC w/ 40% lesion- MedRx per Cards. ~  He denies CP, palpit, dizzy, syncope, SOB, edema, etc; rec to incr exercise program... ~  EKG 7/12 showed NSR, rate66, WNL, NAD... ~  He notes that his father is 53 & had a triple bypass at 28; now showing signs of early dementia. ~  EKG 3/13 showed NSR, rate64, wnl, NAD.Marland KitchenMarland Kitchen ~  He saw Cards, DrMcLean 3/13> HBP, CAD, Chol> last cath 7/10 w/ proxRCA occlusion, 50%proxLAD, lots of collaterals; med Rx- doing well... ~  Followed by [facility], seen last 6/14 & reviewed prior cath 2010 w/ prox occluded RCA, good left to right collaterals, 50%proxLAD; doing satis medically w/o recurrent angina; Rec to incr exercise program & he increased Atorva40  AORTIC STENOSIS (ICD-424.1) - eval by DrMcLean for Cards 9/09... 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 ~  baseline EKG shows NSR, WNL... 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 ~  2DEcho 7/10 showed mod conc LVH, EF=55-60% & norm wall motion, mild AS, mild LA dil & lipomatous hypertrophy of septum. ~  2DEcho 7/14 w/ mild LVH, norm LVF w/ EF=60-65%, Gr1DD, mod calcif AoV leaflets & mild stenosis.  HYPERLIPIDEMIA (ICD-272.4) - now on LIPITOR 40mg - 1/2 tab daily + Fish Oil supplements... DrMcLean has made freq adjustment to his meds (leg cramps on Crestor). ~  FLP 11/08 showed TChol 162, TG 153, HDL 30, LDL 101...needs better diet, may need incr dose... ~  FLP 7/09 showed TChol 146, Tg 188, HDL 27, LDL 82 ~  FLP 4/10 showed TChol 144, TG 205, HDL 29, LDL 81 ~  FLP 10/10 showed TChol 133, TG 187, HDL 28, LDL 68... changed to Crestor40- 1/2 tab daily by Cards. ~  FLP 4/11 showed TChol 115, TG 181, HDL 26, LDL 53 ~  FLP 12/11 on Cres20 showed TChol 135, TG 169,  HDL 27, LDL 74... Cres20 stopped due to aching in his joints... ~  FLP 7/12 on Prav40 showed TChol 156, TG 180, HDL 33, LDL 87... DrMcLean changed him to Lip20 + CoQ10. ~  FLP 9/12 on Lip20+CoQ10 showed TChol 129, TG 149, HDL 33, LDL 66... Continue Lip20. ~  FLP 3/13 on Lip20 showed TChol 122, TG 132, HDL 33, LDL 63 ~  FLP 9/13 on Lip20 showed TChol 125, TG 164, HDL 30, LDL 62... Needs better low fat diet & incr exercise. ~  FLP 4/14 on Lip20 showed  TChol 139, TG 123, HDL 27, LDL 88... DrMcLean incr his Lip to 40mg /d... ~  FLP 8/14 on Atorva40 showed TChol 128, TG 117, HDL 32, LDL 72  GERD (ICD-530.81) - on OMEPRAZOLE 20mg /d... last EGD was urgent procedure 12/07 by DrBrodie w/ meat impaction- 3cmHH, stricture- subseq dilated w/ Savary dilators and no trouble since then...  DIVERTICULOSIS OF COLON (ICD-562.10) - colonoscopy 7/00 by DrStark showed divertics, hems...  ~  last colonoscopy 9/09 by drStark showed divertics, hems, 5mm adenomatous colon polyp removed... f/u 38yrs.  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  BENIGN PROSTATIC HYPERTROPHY, HX OF (ICD-V13.8) - eval by DrGrapey in 2006 w/ BPH, mild outlet symptoms, ED, and hematospermia...  ~  labs 12/06 showed PSA= 2.74 ~  labs 11/08 showed PSA= 3.32 ~  labs 7/09 showed PSA= 2.18 ~  labs 4/10 showed PSA= 2.57 ~  labs 4/11 showed PSA= 2.34 ~  labs 12/11 showed PSA= 2.66 ~  Labs 7/12 showed PSA= 3.30 ~  Labs 9/13 showed PSA= 2.71 ~  Labs 12/14 showed PSA= 4.95 & he is referred to Urology...  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 he tells me he has been on  disability due to his back- per [facility]... currently using TYLENOL + VICODIN (up to 2 per day) & RELAFEN 500mg Bid as needed for pain... ~  3/12:   Rheum f/u DrHawks & she endorsed the use of Tylenol + Voltaren gel for his pain; he also takes Glucosamine & Chondroitin fo his joints. ~  11/12: Lumbar CT per DrNudelman> severe atherosclerosis & infrarenal AAA ~3cm; prev surg w/ ray cage fusion grafts, bulging discs, etc (see report). ~  11/12: MRI Lumbar spine per DrNudelman> bulging discs 7 scarring from prev surg...  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 on ASA 325mg /d + NO Smoking + Chol Rx + HBP Rx etc... no known recurrence...  ANXIETY (ICD-300.00) - prev on Xanax as needed for nerves, this med is no longer on his list (?uses wife's Rx)... stress w/ wife & insomnia...  BORDERLINE B12 DEFICIENCY - B12 level 6/11 at [facility] office = [phone])... rec to start oral B12 supplementation w/ daily. ~  Labs 9/13 showed B12 level = 1179... Ok to decr the B12 supplement.  Health Maintenance - he has been a mod smoker and quits on & off... currently off x 6months and hopefully will remain off...   Past Surgical History  Procedure Laterality Date  . Cholecystectomy  1994  . L4-5 discectomy  4/98    Dr. Newell Coral   . Lumbar laminectomy  9/04    and microdiscectomy. L3-4. Dr. Newell Coral  . Lumbar decompression and ray cage  8/98    L4-5 Dr. Jule Ser  . Cataract surgery  01/2012/02/2012    both eyes/lens implanted    Outpatient Encounter Prescriptions as of 01/27/2013  Medication Sig  . amLODipine (NORVASC) 10 MG tablet TAKE 1 TABLET BY MOUTH DAILY  . aspirin 81 MG EC tablet Take 81 mg by mouth daily.    Marland Kitchen atorvastatin (LIPITOR) 40 MG tablet Take 1 tablet (40 mg total) by mouth daily.  . benazepril (LOTENSIN) 20 MG tablet TAKE 1 TABLET BY MOUTH DAILY  . betamethasone dipropionate (DIPROLENE) 0.05 % cream Apply topically 2 (two) times daily.    . Coenzyme Q10 (CO Q-10) 200 MG CAPS Take 1 capsule by mouth daily.  Marland Kitchen HYDROcodone-acetaminophen  (NORCO/VICODIN) 5-325 MG per tablet TAKE 1/2 TO 1 TABLET BY MOUTH 3 TIMES A DAY AS NEEDED  . isosorbide mononitrate (IMDUR) 30 MG 24 hr tablet TAKE 1 TABLET BY MOUTH AT BEDTIME  . loperamide (IMODIUM A-D) 2 MG tablet Take 2 mg by mouth daily.    . metoprolol (LOPRESSOR) 50 MG tablet TAKE 1 TABLET BY MOUTH TWICE A DAY  . nitroGLYCERIN (NITROSTAT) 0.4 MG SL tablet Place 1 tablet (0.4 mg total) under the tongue every 5 (five) minutes as needed.  . Omega-3 Fatty Acids (FISH OIL) 1000 MG CAPS Take 2 capsules by mouth 2 (two) times daily.    Marland Kitchen omeprazole (PRILOSEC) 20 MG capsule Take 1 capsule (20 mg total) by mouth daily.  . vitamin B-12 (CYANOCOBALAMIN) 1000 MCG tablet Take 1,000 mcg by mouth daily.    . [DISCONTINUED] atorvastatin (LIPITOR) 40 MG tablet Take 1 tablet (40 mg total) by mouth daily.    Allergies  Allergen Reactions  . Celecoxib     REACTION: hives and itching    Current Medications, Allergies, Past Medical History, Past Surgical History, Family History, and Social History were reviewed in [employer] record.    Review of  Systems         See HPI - all other systems neg except as noted...  The patient complains of dyspnea on exertion and difficulty walking.  The patient denies anorexia, fever, weight loss, weight gain, vision loss, decreased hearing, hoarseness, chest pain, syncope, peripheral edema, prolonged cough, headaches, hemoptysis, abdominal pain, melena, hematochezia, severe indigestion/heartburn, hematuria, incontinence, muscle weakness, suspicious skin lesions, transient blindness, depression, unusual weight change, abnormal bleeding, enlarged lymph nodes, and angioedema.   Objective:   Physical Exam     WD, WN, 72 y/o WM in NAD... GENERAL:  Alert & oriented; pleasant & cooperative... HEENT:  [place]/AT, EOM-wnl, PERRLA, EACs-clear, TMs-wnl, NOSE-clear, THROAT-clear & wnl. NECK:  Supple w/ fairROM; no JVD; normal carotid impulses w/o bruits; no  thyromegaly or nodules palpated; no lymphadenopathy. CHEST:  Clear to P & A; without wheezes/ rales/ or rhonchi heard... HEART:  Regular Rhythm; gr 1-2 sys murmur at base without rubs or gallops detected... ABDOMEN:  Soft & nontender; normal bowel sounds; no organomegaly or masses palpated... EXT: without deformities, mild arthritic changes; no varicose veins/ +venous insuffic/ no edema. NEURO:  CN's intact; motor testing normal; sensory testing normal; gait normal & balance OK. DERM:  No lesions noted; no rash etc...  RADIOLOGY DATA:  Reviewed in the EPIC EMR & discussed w/ the patient...  LABORATORY DATA:  Reviewed in the EPIC EMR & discussed w/ the patient...   Assessment & Plan:    HBP>  Controlled on Metoprolol, Amlodipine, Benazepril; continue same...  CAD>  Followed by DrMcLean & stable on ASA, BBlocker, ACE, Statin combo; no angina, needs incr activity & diet efforts, get wt down!  AS>  Followed by DrMcLean, murmur w/o change (mild AS), no symptoms, continue observation...  Peripheral vasc dis w/ ~3cmAAA>  Seen on CT Abd 11/12 and he will need f/u Abd Ao ultrasound later...  HYPERLIPID>  Changed to Lipitor 40mg  + CoQ10 per Cards; FLP looks good on this, encouraged to take regularly & get on low fat diet & incr exercise!!!  GERD>  Stable on Omep20mg /d...  Divertics, Polyps>  Up to date on colon screening from  DrStark...  BPH>  Recent PSA= 4.95 & we are sending him to Urology for further eval...  DJD>  Followed by [facility] & her notes are reviewed...  Hx Brainstem stroke>  Stable on ASA w/o any cerebral ischemic symptoms...  Other medical problems as noted...   Patient's Medications  New Prescriptions   No medications on file  Previous Medications   AMLODIPINE (NORVASC) 10 MG TABLET    TAKE 1 TABLET BY MOUTH DAILY   ASPIRIN 81 MG EC TABLET    Take 81 mg by mouth daily.     ATORVASTATIN (LIPITOR) 40 MG TABLET    Take 1 tablet (40 mg total) by mouth daily.    BENAZEPRIL (LOTENSIN) 20 MG TABLET    TAKE 1 TABLET BY MOUTH DAILY   BETAMETHASONE DIPROPIONATE (DIPROLENE) 0.05 % CREAM    Apply topically 2 (two) times daily.     COENZYME Q10 (CO Q-10) 200 MG CAPS    Take 1 capsule by mouth daily.   ISOSORBIDE MONONITRATE (IMDUR) 30 MG 24 HR TABLET    TAKE 1 TABLET BY MOUTH AT BEDTIME   LOPERAMIDE (IMODIUM A-D) 2 MG TABLET    Take 2 mg by mouth daily.     METOPROLOL (LOPRESSOR) 50 MG TABLET    TAKE 1 TABLET BY MOUTH TWICE A DAY   NITROGLYCERIN (NITROSTAT) 0.4 MG  SL TABLET    Place 1 tablet (0.4 mg total) under the tongue every 5 (five) minutes as needed.   OMEGA-3 FATTY ACIDS (FISH OIL) 1000 MG CAPS    Take 2 capsules by mouth 2 (two) times daily.     VITAMIN B-12 (CYANOCOBALAMIN) 1000 MCG TABLET    Take 1,000 mcg by mouth daily.    Modified Medications   Modified Medication Previous Medication   HYDROCODONE-ACETAMINOPHEN (NORCO/VICODIN) 5-325 MG PER TABLET HYDROcodone-acetaminophen (NORCO/VICODIN) 5-325 MG per tablet      TAKE 1/2 TO 1 TABLET BY MOUTH 3 TIMES A DAY AS NEEDED    TAKE 1/2 TO 1 TABLET BY MOUTH 3 TIMES A DAY AS NEEDED   OMEPRAZOLE (PRILOSEC) 20 MG CAPSULE omeprazole (PRILOSEC) 20 MG capsule      Take 1 capsule (20 mg total) by mouth daily.    Take 1 capsule (20 mg total) by mouth daily.  Discontinued Medications   ATORVASTATIN (LIPITOR) 40 MG TABLET    Take 1 tablet (40 mg total) by mouth daily.

## 2013-01-27 NOTE — Patient Instructions (Signed)
Today we updated your med list in our EPIC system...    Continue your current medications the same...    We refilled your meds per request...  Today we did your follow up CXR & Labs work...    We will contact you w/ the results when available...   Stay as active as poss, and consider joining [facility]...  Call for any questions...  Let's plan a follow up visit in 5mo, sooner if needed for problems.Marland KitchenMarland Kitchen

## 2013-01-30 ENCOUNTER — Other Ambulatory Visit: Payer: Self-pay | Admitting: Pulmonary Disease

## 2013-01-30 DIAGNOSIS — R972 Elevated prostate specific antigen [PSA]: Secondary | ICD-10-CM

## 2013-04-07 ENCOUNTER — Telehealth: Payer: Self-pay | Admitting: Pulmonary Disease

## 2013-04-07 MED ORDER — HYDROCODONE-ACETAMINOPHEN 5-325 MG PO TABS: ORAL_TABLET | ORAL | Status: DC

## 2013-04-07 NOTE — Telephone Encounter (Signed)
Rx signed by SN LM on named voicemail of spouse informing her that rx is ready for pick up and to call back with any questions/concerns Will sign off

## 2013-04-07 NOTE — Telephone Encounter (Signed)
Per SN - ok to refill Hydrocodone

## 2013-04-07 NOTE — Telephone Encounter (Signed)
SN, please advise if you are okay with giving Rx for Hydrocodone this time only with the understanding patient establishes with new PCP. Pt was last seen 01/27/13-told to return in 6 months and was given Hydrocodone Rx then for #90 and no refills.

## 2013-04-07 NOTE — Telephone Encounter (Signed)
Rx has been printed for SN to sign. This has been placed on SN's cart.  Will route message to [facility], that way we will know where the message is.

## 2013-04-15 ENCOUNTER — Telehealth: Payer: Self-pay | Admitting: Pulmonary Disease

## 2013-04-16 NOTE — Telephone Encounter (Signed)
Needs to be seen as may be a reaction to his ace inhibitor - ok to add on to me or see Tammy NP  In meantime use mucinex dm otc

## 2013-04-16 NOTE — Telephone Encounter (Signed)
Called spoke with pt. appt scheduled with MW for tomorrow AM. Nothing further needed

## 2013-04-16 NOTE — Telephone Encounter (Signed)
Nadel patient: LAst OV 01-2013. I spoke with the pt and he is c/o having chest congestion, productive cough with clear phlegm, increased amount of phlegm, fever, and sinus congestion x several days. Pt is requesting rx be sent to [facility] [place]. Please advise. Venango Bing, CMA  Also pt was advised of SN retiring from [facility] and # provided to [place] [employer]

## 2013-04-17 ENCOUNTER — Emergency Department (HOSPITAL_COMMUNITY): Payer: Medicare Other

## 2013-04-17 ENCOUNTER — Encounter (HOSPITAL_COMMUNITY): Payer: Self-pay | Admitting: Emergency Medicine

## 2013-04-17 ENCOUNTER — Emergency Department (HOSPITAL_COMMUNITY)
Admission: EM | Admit: 2013-04-17 | Discharge: 2013-04-17 | Disposition: A | Discharge status: Home / Self Care | Payer: Medicare Other | Attending: Emergency Medicine | Admitting: Emergency Medicine

## 2013-04-17 ENCOUNTER — Ambulatory Visit: Payer: Medicare Other | Admitting: Internal Medicine

## 2013-04-17 DIAGNOSIS — Z860100 Personal history of colon polyps, unspecified: Secondary | ICD-10-CM | POA: Insufficient documentation

## 2013-04-17 DIAGNOSIS — Z79899 Other long term (current) drug therapy: Secondary | ICD-10-CM | POA: Insufficient documentation

## 2013-04-17 DIAGNOSIS — IMO0002 Reserved for concepts with insufficient information to code with codable children: Secondary | ICD-10-CM | POA: Insufficient documentation

## 2013-04-17 DIAGNOSIS — R059 Cough, unspecified: Secondary | ICD-10-CM | POA: Insufficient documentation

## 2013-04-17 DIAGNOSIS — K219 Gastro-esophageal reflux disease without esophagitis: Secondary | ICD-10-CM | POA: Insufficient documentation

## 2013-04-17 DIAGNOSIS — Y9389 Activity, other specified: Secondary | ICD-10-CM | POA: Insufficient documentation

## 2013-04-17 DIAGNOSIS — X500XXA Overexertion from strenuous movement or load, initial encounter: Secondary | ICD-10-CM | POA: Insufficient documentation

## 2013-04-17 DIAGNOSIS — Z8673 Personal history of transient ischemic attack (TIA), and cerebral infarction without residual deficits: Secondary | ICD-10-CM | POA: Insufficient documentation

## 2013-04-17 DIAGNOSIS — I2510 Atherosclerotic heart disease of native coronary artery without angina pectoris: Secondary | ICD-10-CM | POA: Insufficient documentation

## 2013-04-17 DIAGNOSIS — S20219A Contusion of unspecified front wall of thorax, initial encounter: Secondary | ICD-10-CM

## 2013-04-17 DIAGNOSIS — I10 Essential (primary) hypertension: Secondary | ICD-10-CM | POA: Insufficient documentation

## 2013-04-17 DIAGNOSIS — R000 Tachycardia, unspecified: Secondary | ICD-10-CM | POA: Insufficient documentation

## 2013-04-17 DIAGNOSIS — Z87891 Personal history of nicotine dependence: Secondary | ICD-10-CM | POA: Insufficient documentation

## 2013-04-17 DIAGNOSIS — E785 Hyperlipidemia, unspecified: Secondary | ICD-10-CM | POA: Insufficient documentation

## 2013-04-17 DIAGNOSIS — Y929 Unspecified place or not applicable: Secondary | ICD-10-CM | POA: Insufficient documentation

## 2013-04-17 DIAGNOSIS — R05 Cough: Secondary | ICD-10-CM | POA: Insufficient documentation

## 2013-04-17 DIAGNOSIS — Z87448 Personal history of other diseases of urinary system: Secondary | ICD-10-CM | POA: Insufficient documentation

## 2013-04-17 DIAGNOSIS — M1990 Unspecified osteoarthritis, unspecified site: Secondary | ICD-10-CM | POA: Insufficient documentation

## 2013-04-17 DIAGNOSIS — Z7982 Long term (current) use of aspirin: Secondary | ICD-10-CM | POA: Insufficient documentation

## 2013-04-17 LAB — CBC WITH DIFFERENTIAL/PLATELET
BASOS ABS: 0 10*3/uL (ref 0.0–0.1)
BASOS PCT: 0 % (ref 0–1)
Eosinophils Absolute: 0 10*3/uL (ref 0.0–0.7)
Eosinophils Relative: 0 % (ref 0–5)
HCT: 46.7 % (ref 39.0–52.0)
HEMOGLOBIN: 15.7 g/dL (ref 13.0–17.0)
Lymphocytes Relative: 20 % (ref 12–46)
Lymphs Abs: 1.7 10*3/uL (ref 0.7–4.0)
MCH: 29 pg (ref 26.0–34.0)
MCHC: 33.6 g/dL (ref 30.0–36.0)
MCV: 86.2 fL (ref 78.0–100.0)
MONOS PCT: 9 % (ref 3–12)
Monocytes Absolute: 0.7 10*3/uL (ref 0.1–1.0)
NEUTROS ABS: 6.1 10*3/uL (ref 1.7–7.7)
NEUTROS PCT: 71 % (ref 43–77)
Platelets: 144 10*3/uL — ABNORMAL LOW (ref 150–400)
RBC: 5.42 MIL/uL (ref 4.22–5.81)
RDW: 13.3 % (ref 11.5–15.5)
WBC: 8.6 10*3/uL (ref 4.0–10.5)

## 2013-04-17 LAB — BASIC METABOLIC PANEL
BUN: 12 mg/dL (ref 6–23)
CHLORIDE: 95 meq/L — AB (ref 96–112)
CO2: 27 mEq/L (ref 19–32)
Calcium: 9.2 mg/dL (ref 8.4–10.5)
Creatinine, Ser: 0.94 mg/dL (ref 0.50–1.35)
GFR calc non Af Amer: 83 mL/min — ABNORMAL LOW (ref >90)
Glucose, Bld: 146 mg/dL — ABNORMAL HIGH (ref 70–99)
POTASSIUM: 3.7 meq/L (ref 3.7–5.3)
Sodium: 138 mEq/L (ref 137–147)

## 2013-04-17 MED ORDER — OXYCODONE-ACETAMINOPHEN 5-325 MG PO TABS
2.0000 | ORAL_TABLET | Freq: Once | ORAL | Status: AC
  Administered 2013-04-17: 2 via ORAL
  Filled 2013-04-17: qty 2

## 2013-04-17 MED ORDER — ALBUTEROL SULFATE (2.5 MG/3ML) 0.083% IN NEBU
5.0000 mg | INHALATION_SOLUTION | Freq: Once | RESPIRATORY_TRACT | Status: AC
  Administered 2013-04-17: 5 mg via RESPIRATORY_TRACT
  Filled 2013-04-17: qty 6

## 2013-04-17 MED ORDER — AZITHROMYCIN 250 MG PO TABS: ORAL_TABLET | ORAL | Status: DC

## 2013-04-17 MED ORDER — OXYCODONE-ACETAMINOPHEN 5-325 MG PO TABS: 1.0000 | ORAL_TABLET | Freq: Four times a day (QID) | ORAL | Status: DC | PRN

## 2013-04-17 MED ORDER — IPRATROPIUM BROMIDE 0.02 % IN SOLN
0.5000 mg | Freq: Once | RESPIRATORY_TRACT | Status: AC
  Administered 2013-04-17: 0.5 mg via RESPIRATORY_TRACT
  Filled 2013-04-17: qty 2.5

## 2013-04-17 NOTE — Discharge Instructions (Signed)
Please read and follow all provided instructions.  Your diagnoses today include:  1. Rib contusion     Tests performed today include:  An EKG of your heart  A chest x-ray - no definite broken bones  Blood counts and electrolytes  Vital signs. See below for your results today.   Medications prescribed:   Azithromycin - antibiotic for respiratory infection  You have been prescribed an antibiotic medicine: take the entire course of medicine even if you are feeling better. Stopping early can cause the antibiotic not to work.   Percocet (oxycodone/acetaminophen) - narcotic pain medication  DO NOT drive or perform any activities that require you to be awake and alert because this medicine can make you drowsy. BE VERY CAREFUL not to take multiple medicines containing Tylenol (also called acetaminophen). Doing so can lead to an overdose which can damage your liver and cause liver failure and possibly death.  Take any prescribed medications only as directed.  Follow-up instructions: Please follow-up with your primary care provider in the next 3 days for further evaluation of your symptoms.   Take 10 deep breaths every hour while awake. This helps to expand your lungs and prevent infections like pneumonia.   Return instructions:  SEEK IMMEDIATE MEDICAL ATTENTION IF:  You have severe chest pain, especially if the pain is crushing or pressure-like and spreads to the arms, back, neck, or jaw, or if you have sweating, nausea (feeling sick to your stomach), or shortness of breath. THIS IS AN EMERGENCY. Don't wait to see if the pain will go away. Get medical help at once. Call 911 or [phone] (operator). DO NOT drive yourself to the hospital.   Your chest pain gets worse and does not go away with rest.   You have an attack of chest pain lasting longer than usual, despite rest and treatment with the medications your caregiver has prescribed.   You wake from sleep with chest pain or shortness of  breath.  You feel dizzy or faint.  You have chest pain not typical of your usual pain for which you originally saw your caregiver.   You have any other emergent concerns regarding your health.  Additional Information: Chest pain comes from many different causes. Your caregiver has diagnosed you as having chest pain that is not specific for one problem, but does not require admission.  You are at low risk for an acute heart condition or other serious illness.   Your vital signs today were: BP 160/86   Pulse 110   Temp(Src) 98.7 F (37.1 C) (Oral)   Resp 22   SpO2 96% If your blood pressure (BP) was elevated above 135/85 this visit, please have this repeated by your doctor within one month. --------------

## 2013-04-17 NOTE — ED Notes (Signed)
O2 was 93 while ambulating Pt, pulse was 125-130

## 2013-04-17 NOTE — ED Notes (Signed)
Pt has multiple complaints.  States he was seen at MD office yesterday and dx with bronchitis.  Was given antihistamine and cough suppressant.  Pt states he has coughed so much that his rib is hurting. Also states night sweats but does not own thermometer to check temperature.

## 2013-04-17 NOTE — ED Provider Notes (Signed)
CSN: [id number]     Arrival date & time 04/17/13  0946 History   First MD Initiated Contact with Patient 04/17/13 [phone]     Chief Complaint  Patient presents with  . Cough  . Rib Injury     (Consider location/radiation/quality/duration/timing/severity/associated sxs/prior Treatment) HPI Comments: Patient with history of aortic stenosis, no past history of COPD/asthma (but he is a smoker) -- presents with complaint of cough and rib pain. Patient developed cough 3 days ago productive of yellow sputum. Yesterday he had a severe coughing episode and he felt a pop in his left anterior rib cage. He is concerned that he "cracked a rib". Patient saw his primary care physician yesterday he was diagnosed with bronchitis. He was given an antihistamine and a cough suppressant medication. Pain continues to be severe especially when he coughs. Not relieved with home Vicodin. Patient reports subjective fevers during the course of the week. He denies shortness of breath. No nausea, vomiting, or diarrhea. The onset of this condition was acute. The course is constant. Aggravating factors: none. Alleviating factors: none.    The history is provided by the patient.    Past Medical History  Diagnosis Date  . HLD (hyperlipidemia)   . Aortic stenosis     mild echocardiogram 8/09 EF 60%, showed no regional wall motion abnml, mild LVH, mod focal basal septal hypertrophy and mild dyastolic dysfunction. 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 mean gradient across aortic valve was 8 mmHG. also mild L atrial enlargement and normal RV size and function. minimal AS on LHC in 7/10.   Marland Kitchen HTN (hypertension)   . GERD (gastroesophageal reflux disease)   . BPH (benign prostatic hypertrophy)   . Diverticulosis   . CAD (coronary artery disease)     [facility] (7/10) totally occluded proximal RCA with very robuse L to R collaterals, 50% proximal LAD stenosis, EF 65%, medical management.    .  Colon polyps   . Smoker   . TIA (transient ischemic attack)     hx  . Stroke     hx; involved brainstem; slurred speech   . Low back pain     s/p surgical fusion  . Osteoarthritis    Past Surgical History  Procedure Laterality Date  . Cholecystectomy  1994  . L4-5 discectomy  4/98    Dr. Sherwood Gambler   . Lumbar laminectomy  9/04    and microdiscectomy. L3-4. Dr. Sherwood Gambler  . Lumbar decompression and ray cage  8/98    L4-5 Dr. Rita Ohara  . Cataract surgery  01/2012/02/2012    both eyes/lens implanted   History reviewed. No pertinent family history. History  Substance Use Topics  . Smoking status: Former Smoker -- 1.50 packs/day for 50 years    Types: Cigarettes    Quit date: 04/06/2011  . Smokeless tobacco: Former Systems developer     Comment: smoked less than 1 ppd for 40+ years; quit 7/10  . Alcohol Use: Yes     Comment: social     Review of Systems  Constitutional: Negative for fever.  HENT: Negative for rhinorrhea and sore throat.   Eyes: Negative for redness.  Respiratory: Positive for cough. Negative for shortness of breath.   Cardiovascular: Positive for chest pain. Negative for palpitations and leg swelling.  Gastrointestinal: Negative for nausea, vomiting, abdominal pain and diarrhea.  Genitourinary: Negative for dysuria.  Musculoskeletal: Negative for myalgias.  Skin: Negative for rash.  Neurological: Negative for headaches.  Allergies  Celecoxib  Home Medications   Current Outpatient Rx  Name  Route  Sig  Dispense  Refill  . albuterol (PROVENTIL HFA;VENTOLIN HFA) 108 (90 BASE) MCG/ACT inhaler   Inhalation   Inhale 1-2 puffs into the lungs every 6 (six) hours as needed for wheezing or shortness of breath.         Marland Kitchen amLODipine (NORVASC) 10 MG tablet   Oral   Take 10 mg by mouth daily.         Marland Kitchen aspirin 81 MG EC tablet   Oral   Take 81 mg by mouth daily.           Marland Kitchen atorvastatin (LIPITOR) 40 MG tablet   Oral   Take 1 tablet (40 mg total) by mouth daily.    30 tablet   3   . benazepril (LOTENSIN) 20 MG tablet   Oral   Take 20 mg by mouth daily.         . benzonatate (TESSALON) 200 MG capsule   Oral   Take 200 mg by mouth 3 (three) times daily as needed for cough.         . Cetirizine HCl 10 MG CAPS   Oral   Take 20 mg by mouth daily.         . cholecalciferol (VITAMIN D) 1000 UNITS tablet   Oral   Take 1,000 Units by mouth daily.         . Coenzyme Q10 (CO Q-10) 200 MG CAPS   Oral   Take 1 capsule by mouth daily.      0   . fluticasone (FLONASE) 50 MCG/ACT nasal spray   Each Nare   Place 2 sprays into both nostrils daily.         Marland Kitchen HYDROcodone-acetaminophen (NORCO/VICODIN) 5-325 MG per tablet   Oral   Take 0.5-1 tablets by mouth every 6 (six) hours as needed for moderate pain.         . isosorbide mononitrate (IMDUR) 30 MG 24 hr tablet   Oral   Take 30 mg by mouth at bedtime.         Marland Kitchen loperamide (IMODIUM A-D) 2 MG tablet   Oral   Take 2 mg by mouth daily as needed for diarrhea or loose stools.          . metoprolol (LOPRESSOR) 50 MG tablet   Oral   Take 50 mg by mouth 2 (two) times daily.         . nitroGLYCERIN (NITROSTAT) 0.4 MG SL tablet   Sublingual   Place 0.4 mg under the tongue every 5 (five) minutes as needed for chest pain.         . Omega-3 Fatty Acids (FISH OIL) 1000 MG CAPS   Oral   Take 2 capsules by mouth 2 (two) times daily.           Marland Kitchen omeprazole (PRILOSEC) 20 MG capsule   Oral   Take 1 capsule (20 mg total) by mouth daily.   90 capsule   3   . vitamin B-12 (CYANOCOBALAMIN) 1000 MCG tablet   Oral   Take 1,000 mcg by mouth daily.            BP 153/86  Pulse 124  Temp(Src) 98.7 F (37.1 C) (Oral)  Resp 22  SpO2 93%  Physical Exam  Nursing note and vitals reviewed. Constitutional: He appears well-developed and well-nourished.  HENT:  Head: Normocephalic and atraumatic.  Eyes: Conjunctivae are normal. Right eye exhibits no discharge. Left eye exhibits no  discharge.  Neck: Normal range of motion. Neck supple.  Cardiovascular: Normal rate, regular rhythm and normal heart sounds.   Pulmonary/Chest: Effort normal and breath sounds normal. No respiratory distress. He has no wheezes. He has no rales. He exhibits tenderness.    Abdominal: Soft. There is no tenderness. There is no rebound and no guarding.  Neurological: He is alert.  Skin: Skin is warm and dry.  Psychiatric: He has a normal mood and affect.    ED Course  Procedures (including critical care time) Labs Review Labs Reviewed  CBC WITH DIFFERENTIAL - Abnormal; Notable for the following:    Platelets 144 (*)    All other components within normal limits  BASIC METABOLIC PANEL - Abnormal; Notable for the following:    Chloride 95 (*)    Glucose, Bld 146 (*)    GFR calc non Af Amer 83 (*)    All other components within normal limits   Imaging Review Dg Chest 2 View  04/17/2013   CLINICAL DATA:  Cough, congestion  EXAM: CHEST  2 VIEW  COMPARISON:  January 27, 2013  FINDINGS: The heart size and mediastinal contours are within normal limits. There is no focal infiltrate, pulmonary edema, or pleural effusion. Small calcified granuloma is identified in the right lung base. The visualized skeletal structures are stable.  IMPRESSION: No active cardiopulmonary disease.   Electronically Signed   By: Abelardo Diesel M.D.   On: 04/17/2013 10:55     EKG Interpretation None      11:12 AM Patient seen and examined. Work-up initiated. Medications ordered.   Vital signs reviewed and are as follows: Filed Vitals:   04/17/13 1020  BP: 153/86  Pulse: 124  Temp:   Resp:   BP 153/86  Pulse 124  Temp(Src) 98.7 F (37.1 C) (Oral)  Resp 22  SpO2 93%  Patient feels better after Percocet. Informed of CXR results. HR improved with pain control. Patient ambulated without desaturation.   Patient d/w and seen by Dr. Aline Brochure.   Feel safe for discharge with pain control, incentive spirometer,  azithromycin.   Patient urged to return with worsening symptoms or other concerns. Patient verbalized understanding and agrees with plan.   MDM   Final diagnoses:  Rib contusion   Patient with recent cough, possible rib fracture/contusion. Chest x-ray is negative. Patient is tachycardic in emergency department. This is felt most likely due to pain. Patient noted to have improvement in heart rate with pain control and worsening when the rib was palpated. Patient has ambulated and maintained oxygen saturation. He does likely have some component of splinting and feel he is higher risk for pneumonia given his significant pain. Hopefully this will improve the pain control, however patient given incentive spirometer and azithromycin.  No dangerous or life-threatening conditions suspected or identified by history, physical exam, and by work-up. No indications for hospitalization identified.      Carlisle Cater, PA-C 04/17/13 1525

## 2013-04-18 NOTE — ED Provider Notes (Signed)
Medical screening examination/treatment/procedure(s) were conducted as a shared visit with non-physician practitioner(s) and myself.  I personally evaluated the patient during the encounter.   EKG Interpretation None      I interviewed and examined the patient. Lungs are CTAB. Cardiac exam wnl. Abdomen soft. Focal chest wall ttp at lower anterolateral left ribs. Pt is tachycardic here, but I suspect this is driven by the fact that he did not take his BB this morning and his pain. He denies any sob. His pain is easily reproducible and his HR inc w/ palpation of his ribs. Will send home on abx d/t likely hypoventilation w/ rib pain, stronger pain control for home, and incentive spirometer.   Blanchard Kelch, MD 04/18/13 1054

## 2013-04-27 ENCOUNTER — Other Ambulatory Visit: Payer: Self-pay | Admitting: *Deleted

## 2013-04-27 MED ORDER — ATORVASTATIN CALCIUM 40 MG PO TABS: 40.0000 mg | ORAL_TABLET | Freq: Every day | ORAL | Status: DC

## 2013-05-15 ENCOUNTER — Encounter: Payer: Self-pay | Admitting: Physician Assistant

## 2013-05-15 ENCOUNTER — Ambulatory Visit (INDEPENDENT_AMBULATORY_CARE_PROVIDER_SITE_OTHER): Payer: Medicare Other | Admitting: Physician Assistant

## 2013-05-15 ENCOUNTER — Telehealth: Payer: Self-pay | Admitting: Physician Assistant

## 2013-05-15 VITALS — BP 126/70 | HR 72 | Temp 97.9°F | Resp 16 | Ht 68.0 in | Wt 185.0 lb

## 2013-05-15 DIAGNOSIS — K219 Gastro-esophageal reflux disease without esophagitis: Secondary | ICD-10-CM

## 2013-05-15 DIAGNOSIS — E785 Hyperlipidemia, unspecified: Secondary | ICD-10-CM

## 2013-05-15 DIAGNOSIS — M545 Low back pain, unspecified: Secondary | ICD-10-CM

## 2013-05-15 DIAGNOSIS — I10 Essential (primary) hypertension: Secondary | ICD-10-CM

## 2013-05-15 LAB — LIPID PANEL
CHOLESTEROL: 116 mg/dL (ref 0–200)
HDL: 29 mg/dL — ABNORMAL LOW (ref >39)
LDL Cholesterol: 66 mg/dL (ref 0–99)
Total CHOL/HDL Ratio: 4 Ratio
Triglycerides: 103 mg/dL (ref <150)
VLDL: 21 mg/dL (ref 0–40)

## 2013-05-15 LAB — COMPREHENSIVE METABOLIC PANEL
ALT: 15 U/L (ref 0–53)
AST: 15 U/L (ref 0–37)
Albumin: 3.7 g/dL (ref 3.5–5.2)
Alkaline Phosphatase: 107 U/L (ref 39–117)
BUN: 11 mg/dL (ref 6–23)
CALCIUM: 9.1 mg/dL (ref 8.4–10.5)
CHLORIDE: 101 meq/L (ref 96–112)
CO2: 28 meq/L (ref 19–32)
Creat: 0.76 mg/dL (ref 0.50–1.35)
Glucose, Bld: 80 mg/dL (ref 70–99)
Potassium: 4.4 mEq/L (ref 3.5–5.3)
SODIUM: 137 meq/L (ref 135–145)
TOTAL PROTEIN: 6.2 g/dL (ref 6.0–8.3)
Total Bilirubin: 0.4 mg/dL (ref 0.2–1.2)

## 2013-05-15 MED ORDER — AMLODIPINE BESYLATE 10 MG PO TABS: 10.0000 mg | ORAL_TABLET | Freq: Every day | ORAL | Status: DC

## 2013-05-15 MED ORDER — HYDROCODONE-ACETAMINOPHEN 5-325 MG PO TABS: 0.5000 | ORAL_TABLET | Freq: Four times a day (QID) | ORAL | Status: DC | PRN

## 2013-05-15 MED ORDER — PITAVASTATIN CALCIUM 4 MG PO TABS: 1.0000 | ORAL_TABLET | Freq: Every day | ORAL | Status: DC

## 2013-05-15 MED ORDER — METOPROLOL TARTRATE 50 MG PO TABS: 50.0000 mg | ORAL_TABLET | Freq: Two times a day (BID) | ORAL | Status: DC

## 2013-05-15 MED ORDER — BENAZEPRIL HCL 20 MG PO TABS: 20.0000 mg | ORAL_TABLET | Freq: Every day | ORAL | Status: DC

## 2013-05-15 NOTE — Patient Instructions (Signed)
Please go to lab for blood work. I will call you with your results.  STOP the Lipitor.  Begin taking Lovaza daily.  Follow-up in 1 month (Can do your Medicare Wellness visit at that time) Continue other medications as directed.  You will be contacted by GI for a colonoscopy.  It was a pleasure participating in your care today.  Welcome to our practice!  Preventive Care for Adults, Male A healthy lifestyle and preventive care can promote health and wellness. Preventive health guidelines for men include the following key practices:  A routine yearly physical is a good way to check with your health care provider about your health and preventative screening. It is a chance to share any concerns and updates on your health and to receive a thorough exam.  Visit your dentist for a routine exam and preventative care every 6 months. Brush your teeth twice a day and floss once a day. Good oral hygiene prevents tooth decay and gum disease.  The frequency of eye exams is based on your age, health, family medical history, use of contact lenses, and other factors. Follow your health care provider's recommendations for frequency of eye exams.  Eat a healthy diet. Foods such as vegetables, fruits, whole grains, low-fat dairy products, and lean protein foods contain the nutrients you need without too many calories. Decrease your intake of foods high in solid fats, added sugars, and salt. Eat the right amount of calories for you.Get information about a proper diet from your health care provider, if necessary.  Regular physical exercise is one of the most important things you can do for your health. Most adults should get at least 150 minutes of moderate-intensity exercise (any activity that increases your heart rate and causes you to sweat) each week. In addition, most adults need muscle-strengthening exercises on 2 or more days a week.  Maintain a healthy weight. The body mass index (BMI) is a screening tool to  identify possible weight problems. It provides an estimate of body fat based on height and weight. Your health care provider can find your BMI and can help you achieve or maintain a healthy weight.For adults 20 years and older:  A BMI below 18.5 is considered underweight.  A BMI of 18.5 to 24.9 is normal.  A BMI of 25 to 29.9 is considered overweight.  A BMI of 30 and above is considered obese.  Maintain normal blood lipids and cholesterol levels by exercising and minimizing your intake of saturated fat. Eat a balanced diet with plenty of fruit and vegetables. Blood tests for lipids and cholesterol should begin at age 77 and be repeated every 5 years. If your lipid or cholesterol levels are high, you are over 50, or you are at high risk for heart disease, you may need your cholesterol levels checked more frequently.Ongoing high lipid and cholesterol levels should be treated with medicines if diet and exercise are not working.  If you smoke, find out from your health care provider how to quit. If you do not use tobacco, do not start.  Lung cancer screening is recommended for adults aged 69 80 years who are at high risk for developing lung cancer because of a history of smoking. A yearly low-dose CT scan of the lungs is recommended for people who have at least a 30-pack-year history of smoking and are a current smoker or have quit within the past 15 years. A pack year of smoking is smoking an average of 1 pack of cigarettes  a day for 1 year (for example: 1 pack a day for 30 years or 2 packs a day for 15 years). Yearly screening should continue until the smoker has stopped smoking for at least 15 years. Yearly screening should be stopped for people who develop a health problem that would prevent them from having lung cancer treatment.  If you choose to drink alcohol, do not have more than 2 drinks per day. One drink is considered to be 12 ounces (355 mL) of beer, 5 ounces (148 mL) of wine, or 1.5  ounces (44 mL) of liquor.  Avoid use of street drugs. Do not share needles with anyone. Ask for help if you need support or instructions about stopping the use of drugs.  High blood pressure causes heart disease and increases the risk of stroke. Your blood pressure should be checked at least every 1 2 years. Ongoing high blood pressure should be treated with medicines, if weight loss and exercise are not effective.  If you are 39 71 years old, ask your health care provider if you should take aspirin to prevent heart disease.  Diabetes screening involves taking a blood sample to check your fasting blood sugar level. This should be done once every 3 years, after age 65, if you are within normal weight and without risk factors for diabetes. Testing should be considered at a younger age or be carried out more frequently if you are overweight and have at least 1 risk factor for diabetes.  Colorectal cancer can be detected and often prevented. Most routine colorectal cancer screening begins at the age of 57 and continues through age 82. However, your health care provider may recommend screening at an earlier age if you have risk factors for colon cancer. On a yearly basis, your health care provider may provide home test kits to check for hidden blood in the stool. Use of a small camera at the end of a tube to directly examine the colon (sigmoidoscopy or colonoscopy) can detect the earliest forms of colorectal cancer. Talk to your health care provider about this at age 65, when routine screening begins. Direct exam of the colon should be repeated every 5 10 years through age 50, unless early forms of precancerous polyps or small growths are found.  People who are at an increased risk for hepatitis B should be screened for this virus. You are considered at high risk for hepatitis B if:  You were born in a country where hepatitis B occurs often. Talk with your health care provider about which countries are  considered high-risk.  Your parents were born in a high-risk country and you have not received a shot to protect against hepatitis B (hepatitis B vaccine).  You have HIV or AIDS.  You use needles to inject street drugs.  You live with, or have sex with, someone who has hepatitis B.  You are a man who has sex with other men (MSM).  You get hemodialysis treatment.  You take certain medicines for conditions such as cancer, organ transplantation, and autoimmune conditions.  Hepatitis C blood testing is recommended for all people born from [birth year] through 1965 and any individual with known risks for hepatitis C.  Practice safe sex. Use condoms and avoid high-risk sexual practices to reduce the spread of sexually transmitted infections (STIs). STIs include gonorrhea, chlamydia, syphilis, trichomonas, herpes, HPV, and human immunodeficiency virus (HIV). Herpes, HIV, and HPV are viral illnesses that have no cure. They can result in disability, cancer, and  death.  A one-time screening for abdominal aortic aneurysm (AAA) and surgical repair of large AAAs by ultrasound are recommended for men ages 66 to 48 years who are current or former smokers.  Healthy men should no longer receive prostate-specific antigen (PSA) blood tests as part of routine cancer screening. Talk with your health care provider about prostate cancer screening.  Testicular cancer screening is not recommended for adult males who have no symptoms. Screening includes self-exam, a health care provider exam, and other screening tests. Consult with your health care provider about any symptoms you have or any concerns you have about testicular cancer.  Use sunscreen. Apply sunscreen liberally and repeatedly throughout the day. You should seek shade when your shadow is shorter than you. Protect yourself by wearing long sleeves, pants, a wide-brimmed hat, and sunglasses year round, whenever you are outdoors.  Once a month, do a whole-body  skin exam, using a mirror to look at the skin on your back. Tell your health care provider about new moles, moles that have irregular borders, moles that are larger than a pencil eraser, or moles that have changed in shape or color.  Stay current with required vaccines (immunizations).  Influenza vaccine. All adults should be immunized every year.  Tetanus, diphtheria, and acellular pertussis (Td, Tdap) vaccine. An adult who has not previously received Tdap or who does not know his vaccine status should receive 1 dose of Tdap. This initial dose should be followed by tetanus and diphtheria toxoids (Td) booster doses every 10 years. Adults with an unknown or incomplete history of completing a 3-dose immunization series with Td-containing vaccines should begin or complete a primary immunization series including a Tdap dose. Adults should receive a Td booster every 10 years.  Varicella vaccine. An adult without evidence of immunity to varicella should receive 2 doses or a second dose if he has previously received 1 dose.  Human papillomavirus (HPV) vaccine. Males aged 50 21 years who have not received the vaccine previously should receive the 3-dose series. Males aged 5 26 years may be immunized. Immunization is recommended through the age of 104 years for any male who has sex with males and did not get any or all doses earlier. Immunization is recommended for any person with an immunocompromised condition through the age of 79 years if he did not get any or all doses earlier. During the 3-dose series, the second dose should be obtained 4 8 weeks after the first dose. The third dose should be obtained 24 weeks after the first dose and 16 weeks after the second dose.  Zoster vaccine. One dose is recommended for adults aged 53 years or older unless certain conditions are present.  Measles, mumps, and rubella (MMR) vaccine. Adults born before [birth year] generally are considered immune to measles and mumps. Adults  born in [birth year] or later should have 1 or more doses of MMR vaccine unless there is a contraindication to the vaccine or there is laboratory evidence of immunity to each of the three diseases. A routine second dose of MMR vaccine should be obtained at least 28 days after the first dose for students attending postsecondary schools, health care workers, or international travelers. People who received inactivated measles vaccine or an unknown type of measles vaccine during 1963 1967 should receive 2 doses of MMR vaccine. People who received inactivated mumps vaccine or an unknown type of mumps vaccine before 1979 and are at high risk for mumps infection should consider immunization with 2 doses of  MMR vaccine. Unvaccinated health care workers born before [birth year] who lack laboratory evidence of measles, mumps, or rubella immunity or laboratory confirmation of disease should consider measles and mumps immunization with 2 doses of MMR vaccine or rubella immunization with 1 dose of MMR vaccine.  Pneumococcal 13-valent conjugate (PCV13) vaccine. When indicated, a person who is uncertain of his immunization history and has no record of immunization should receive the PCV13 vaccine. An adult aged 43 years or older who has certain medical conditions and has not been previously immunized should receive 1 dose of PCV13 vaccine. This PCV13 should be followed with a dose of pneumococcal polysaccharide (PPSV23) vaccine. The PPSV23 vaccine dose should be obtained at least 8 weeks after the dose of PCV13 vaccine. An adult aged 39 years or older who has certain medical conditions and previously received 1 or more doses of PPSV23 vaccine should receive 1 dose of PCV13. The PCV13 vaccine dose should be obtained 1 or more years after the last PPSV23 vaccine dose.  Pneumococcal polysaccharide (PPSV23) vaccine. When PCV13 is also indicated, PCV13 should be obtained first. All adults aged 37 years and older should be immunized. An adult  younger than age 36 years who has certain medical conditions should be immunized. Any person who resides in a nursing home or long-term care facility should be immunized. An adult smoker should be immunized. People with an immunocompromised condition and certain other conditions should receive both PCV13 and PPSV23 vaccines. People with human immunodeficiency virus (HIV) infection should be immunized as soon as possible after diagnosis. Immunization during chemotherapy or radiation therapy should be avoided. Routine use of PPSV23 vaccine is not recommended for American Indians, [place] Natives, or people younger than 65 years unless there are medical conditions that require PPSV23 vaccine. When indicated, people who have unknown immunization and have no record of immunization should receive PPSV23 vaccine. One-time revaccination 5 years after the first dose of PPSV23 is recommended for people aged 105 64 years who have chronic kidney failure, nephrotic syndrome, asplenia, or immunocompromised conditions. People who received 1 2 doses of PPSV23 before age 33 years should receive another dose of PPSV23 vaccine at age 47 years or later if at least 5 years have passed since the previous dose. Doses of PPSV23 are not needed for people immunized with PPSV23 at or after age 39 years.  Meningococcal vaccine. Adults with asplenia or persistent complement component deficiencies should receive 2 doses of quadrivalent meningococcal conjugate (MenACWY-D) vaccine. The doses should be obtained at least 2 months apart. Microbiologists working with certain meningococcal bacteria, [employer] recruits, people at risk during an outbreak, and people who travel to or live in countries with a high rate of meningitis should be immunized. A first-year college student up through age 60 years who is living in a residence hall should receive a dose if he did not receive a dose on or after his 16th birthday. Adults who have certain high-risk  conditions should receive one or more doses of vaccine.  Hepatitis A vaccine. Adults who wish to be protected from this disease, have certain high-risk conditions, work with hepatitis A-infected animals, work in hepatitis A research labs, or travel to or work in countries with a high rate of hepatitis A should be immunized. Adults who were previously unvaccinated and who anticipate close contact with an international adoptee during the first 60 days after arrival in the [place] from a country with a high rate of hepatitis A should be immunized.  Hepatitis B  vaccine. Adults who wish to be protected from this disease, have certain high-risk conditions, may be exposed to blood or other infectious body fluids, are household contacts or sex partners of hepatitis B positive people, are clients or workers in certain care facilities, or travel to or work in countries with a high rate of hepatitis B should be immunized.  Haemophilus influenzae type b (Hib) vaccine. A previously unvaccinated person with asplenia or sickle cell disease or having a scheduled splenectomy should receive 1 dose of Hib vaccine. Regardless of previous immunization, a recipient of a hematopoietic stem cell transplant should receive a 3-dose series 6 12 months after his successful transplant. Hib vaccine is not recommended for adults with HIV infection. Preventive Service / Frequency Ages 64 to 71  Blood pressure check.** / Every 1 to 2 years.  Lipid and cholesterol check.** / Every 5 years beginning at age 45.  Hepatitis C blood test.** / For any individual with known risks for hepatitis C.  Skin self-exam. / Monthly.  Influenza vaccine. / Every year.  Tetanus, diphtheria, and acellular pertussis (Tdap, Td) vaccine.** / Consult your health care provider. 1 dose of Td every 10 years.  Varicella vaccine.** / Consult your health care provider.  HPV vaccine. / 3 doses over 6 months, if 24 or younger.  Measles, mumps,  rubella (MMR) vaccine.** / You need at least 1 dose of MMR if you were born in 1957 or later. You may also need a second dose.  Pneumococcal 13-valent conjugate (PCV13) vaccine.** / Consult your health care provider.  Pneumococcal polysaccharide (PPSV23) vaccine.** / 1 to 2 doses if you smoke cigarettes or if you have certain conditions.  Meningococcal vaccine.** / 1 dose if you are age 57 to 29 years and a Market researcher living in a residence hall, or have one of several medical conditions. You may also need additional booster doses.  Hepatitis A vaccine.** / Consult your health care provider.  Hepatitis B vaccine.** / Consult your health care provider.  Haemophilus influenzae type b (Hib) vaccine.** / Consult your health care provider. Ages 84 to 3  Blood pressure check.** / Every 1 to 2 years.  Lipid and cholesterol check.** / Every 5 years beginning at age 72.  Lung cancer screening. / Every year if you are aged 38 80 years and have a 30-pack-year history of smoking and currently smoke or have quit within the past 15 years. Yearly screening is stopped once you have quit smoking for at least 15 years or develop a health problem that would prevent you from having lung cancer treatment.  Fecal occult blood test (FOBT) of stool. / Every year beginning at age 36 and continuing until age 71. You may not have to do this test if you get a colonoscopy every 10 years.  Flexible sigmoidoscopy** or colonoscopy.** / Every 5 years for a flexible sigmoidoscopy or every 10 years for a colonoscopy beginning at age 53 and continuing until age 3.  Hepatitis C blood test.** / For all people born from [birth year] through 1963 and any individual with known risks for hepatitis C.  Skin self-exam. / Monthly.  Influenza vaccine. / Every year.  Tetanus, diphtheria, and acellular pertussis (Tdap/Td) vaccine.** / Consult your health care provider. 1 dose of Td every 10 years.  Varicella vaccine.** /  Consult your health care provider.  Zoster vaccine.** / 1 dose for adults aged 47 years or older.  Measles, mumps, rubella (MMR) vaccine.** / You need at least 1  dose of MMR if you were born in 1955 or later. You may also need a second dose.  Pneumococcal 13-valent conjugate (PCV13) vaccine.** / Consult your health care provider.  Pneumococcal polysaccharide (PPSV23) vaccine.** / 1 to 2 doses if you smoke cigarettes or if you have certain conditions.  Meningococcal vaccine.** / Consult your health care provider.  Hepatitis A vaccine.** / Consult your health care provider.  Hepatitis B vaccine.** / Consult your health care provider.  Haemophilus influenzae type b (Hib) vaccine.** / Consult your health care provider. Ages 58 and over  Blood pressure check.** / Every 1 to 2 years.  Lipid and cholesterol check.**/ Every 5 years beginning at age 10.  Lung cancer screening. / Every year if you are aged 68 80 years and have a 30-pack-year history of smoking and currently smoke or have quit within the past 15 years. Yearly screening is stopped once you have quit smoking for at least 15 years or develop a health problem that would prevent you from having lung cancer treatment.  Fecal occult blood test (FOBT) of stool. / Every year beginning at age 75 and continuing until age 64. You may not have to do this test if you get a colonoscopy every 10 years.  Flexible sigmoidoscopy** or colonoscopy.** / Every 5 years for a flexible sigmoidoscopy or every 10 years for a colonoscopy beginning at age 71 and continuing until age 74.  Hepatitis C blood test.** / For all people born from [birth year] through 1963 and any individual with known risks for hepatitis C.  Abdominal aortic aneurysm (AAA) screening.** / A one-time screening for ages 64 to 68 years who are current or former smokers.  Skin self-exam. / Monthly.  Influenza vaccine. / Every year.  Tetanus, diphtheria, and acellular pertussis (Tdap/Td)  vaccine.** / 1 dose of Td every 10 years.  Varicella vaccine.** / Consult your health care provider.  Zoster vaccine.** / 1 dose for adults aged 4 years or older.  Pneumococcal 13-valent conjugate (PCV13) vaccine.** / Consult your health care provider.  Pneumococcal polysaccharide (PPSV23) vaccine.** / 1 dose for all adults aged 78 years and older.  Meningococcal vaccine.** / Consult your health care provider.  Hepatitis A vaccine.** / Consult your health care provider.  Hepatitis B vaccine.** / Consult your health care provider.  Haemophilus influenzae type b (Hib) vaccine.** / Consult your health care provider. **Family history and personal history of risk and conditions may change your health care provider's recommendations. Document Released: 03/20/2001 Document Revised: 11/12/2012 Document Reviewed: 06/19/2010 [facility] Patient Information 2014 [place], [place].

## 2013-05-15 NOTE — Progress Notes (Signed)
Pre visit review using our clinic review tool, if applicable. No additional management support is needed unless otherwise documented below in the visit note/SLS  

## 2013-05-15 NOTE — Telephone Encounter (Signed)
Relevant patient education assigned to patient using Emmi. ° °

## 2013-05-15 NOTE — Progress Notes (Deleted)
Patient presents to clinic today c/o ***.   Past Medical History  Diagnosis Date  . HLD (hyperlipidemia)   . Aortic stenosis     mild echocardiogram 8/09 EF 60%, showed no regional wall motion abnml, mild LVH, mod focal basal septal hypertrophy and mild dyastolic dysfunction. 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 mean gradient across aortic valve was 8 mmHG. also mild L atrial enlargement and normal RV size and function. minimal AS on LHC in 7/10.   Marland Kitchen HTN (hypertension)   . GERD (gastroesophageal reflux disease)   . BPH (benign prostatic hypertrophy)   . Diverticulosis   . CAD (coronary artery disease)     LCH (7/10) totally occluded proximal RCA with very robuse L to R collaterals, 50% proximal LAD stenosis, EF 65%, medical management.    . Colon polyps   . Smoker   . TIA (transient ischemic attack)     hx  . Stroke     hx; involved brainstem; slurred speech   . Low back pain     s/p surgical fusion  . Osteoarthritis   . Chicken pox     Current Outpatient Prescriptions on File Prior to Visit  Medication Sig Dispense Refill  . albuterol (PROVENTIL HFA;VENTOLIN HFA) 108 (90 BASE) MCG/ACT inhaler Inhale 1-2 puffs into the lungs every 6 (six) hours as needed for wheezing or shortness of breath.      Marland Kitchen amLODipine (NORVASC) 10 MG tablet Take 10 mg by mouth daily.      Marland Kitchen aspirin 81 MG EC tablet Take 81 mg by mouth daily.        Marland Kitchen atorvastatin (LIPITOR) 40 MG tablet Take 1 tablet (40 mg total) by mouth daily.  90 tablet  0  . benazepril (LOTENSIN) 20 MG tablet Take 20 mg by mouth daily.      . Cetirizine HCl 10 MG CAPS Take 20 mg by mouth daily.      . cholecalciferol (VITAMIN D) 1000 UNITS tablet Take 1,000 Units by mouth daily.      . Coenzyme Q10 (CO Q-10) 200 MG CAPS Take 1 capsule by mouth daily.    0  . fluticasone (FLONASE) 50 MCG/ACT nasal spray Place 2 sprays into both nostrils daily.      Marland Kitchen HYDROcodone-acetaminophen (NORCO/VICODIN)  5-325 MG per tablet Take 0.5-1 tablets by mouth every 6 (six) hours as needed for moderate pain.      . isosorbide mononitrate (IMDUR) 30 MG 24 hr tablet Take 30 mg by mouth at bedtime.      Marland Kitchen loperamide (IMODIUM A-D) 2 MG tablet Take 2 mg by mouth daily as needed for diarrhea or loose stools.       . metoprolol (LOPRESSOR) 50 MG tablet Take 50 mg by mouth 2 (two) times daily.      . nitroGLYCERIN (NITROSTAT) 0.4 MG SL tablet Place 0.4 mg under the tongue every 5 (five) minutes as needed for chest pain.      . Omega-3 Fatty Acids (FISH OIL) 1000 MG CAPS Take 2 capsules by mouth 2 (two) times daily.        Marland Kitchen omeprazole (PRILOSEC) 20 MG capsule Take 1 capsule (20 mg total) by mouth daily.  90 capsule  3  . vitamin B-12 (CYANOCOBALAMIN) 1000 MCG tablet Take 1,000 mcg by mouth daily.         No current facility-administered medications on file prior to visit.    Allergies  Allergen  Reactions  . Celecoxib     REACTION: hives and itching    Family History  Problem Relation Age of Onset  . Alzheimer's disease Mother 88    Deceased  . Arthritis/Rheumatoid Mother   . Heart disease Father 52    Living  . Coronary artery disease Father   . Stomach cancer Maternal Uncle   . Brain cancer Maternal Aunt     x2  . Healthy Brother   . Obesity Daughter     Had Bypass Sx    History   Social History  . Marital Status: Married    Spouse Name: etta    Number of Children: N/A  . Years of Education: N/A   Occupational History  . retired     disabled due to back problems   Social History Main Topics  . Smoking status: Former Smoker -- 1.50 packs/day for 50 years    Types: Cigarettes    Quit date: 04/06/2011  . Smokeless tobacco: Former Systems developer     Comment: smoked less than 1 ppd for 40+ years; quit 7/10  . Alcohol Use: Yes     Comment: social   . Drug Use: No  . Sexual Activity: None   Other Topics Concern  . None   Social History Narrative   Married (3rd), [place]. 2 children from 1st  marriage, 4 step children.    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       Cell # [id number]    Review of Systems - See HPI.  All other ROS are negative.  Ht '5\' 8"'  (1.727 m)  Wt 185 lb (83.915 kg)  BMI 28.14 kg/m2  Physical Exam  Recent Results (from the past 2160 hour(s))  CBC WITH DIFFERENTIAL     Status: Abnormal   Collection Time    04/17/13 12:00 PM      Result Value Ref Range   WBC 8.6  4.0 - 10.5 K/uL   RBC 5.42  4.22 - 5.81 MIL/uL   Hemoglobin 15.7  13.0 - 17.0 g/dL   HCT 46.7  39.0 - 52.0 %   MCV 86.2  78.0 - 100.0 fL   MCH 29.0  26.0 - 34.0 pg   MCHC 33.6  30.0 - 36.0 g/dL   RDW 13.3  11.5 - 15.5 %   Platelets 144 (*) 150 - 400 K/uL   Neutrophils Relative % 71  43 - 77 %   Neutro Abs 6.1  1.7 - 7.7 K/uL   Lymphocytes Relative 20  12 - 46 %   Lymphs Abs 1.7  0.7 - 4.0 K/uL   Monocytes Relative 9  3 - 12 %   Monocytes Absolute 0.7  0.1 - 1.0 K/uL   Eosinophils Relative 0  0 - 5 %   Eosinophils Absolute 0.0  0.0 - 0.7 K/uL   Basophils Relative 0  0 - 1 %   Basophils Absolute 0.0  0.0 - 0.1 K/uL  BASIC METABOLIC PANEL     Status: Abnormal   Collection Time    04/17/13 12:00 PM      Result Value Ref Range   Sodium 138  137 - 147 mEq/L   Potassium 3.7  3.7 - 5.3 mEq/L   Chloride 95 (*) 96 - 112 mEq/L   CO2 27  19 - 32 mEq/L   Glucose, Bld 146 (*) 70 - 99 mg/dL  BUN 12  6 - 23 mg/dL   Creatinine, Ser 0.94  0.50 - 1.35 mg/dL   Calcium 9.2  8.4 - 10.5 mg/dL   GFR calc non Af Amer 83 (*) >90 mL/min   GFR calc Af Amer >90  >90 mL/min   Comment: (NOTE)     The eGFR has been calculated using the CKD EPI equation.     This calculation has not been validated in all clinical situations.     eGFR's persistently <90 mL/min signify possible Chronic Kidney     Disease.    Assessment/Plan: No problem-specific assessment & plan notes found for this encounter.

## 2013-05-15 NOTE — Progress Notes (Signed)
Patient presents to clinic today to establish care.  Acute Concerns: Myalgias -- Patient with history of significant osteoarthritis, c/o worsening myalgias and arthralgias since starting Lipitor.  Patient with history of CAD and Hyperlipidemia, requiring significant LDL reduction.    Chronic Issues: Hypertension -- controlled with Norvasc, Lotensin, Lopressor and Imdur.  Patient takes an 81 mg aspirin.  Patient followed by Cardiology.   GERD -- Controlled with daily Prilosec.  BPH -- followed by Urology.   Hyperlipidemia -- see above.  Osteoarthritis -- patient with daily arthritic pain.  Controlled mostly with Vicodin PRN.  Patient is able to ambulate without too much difficulty.  Health Maintenance: Dental -- Edentulous Vision -- UTD Immunizations -- Due for Tetanus.  Overdue for Pneumonia vaccination. Colonoscopy -- Due for colonoscopy; will make referral  Past Medical History  Diagnosis Date  . HLD (hyperlipidemia)   . Aortic stenosis     mild echocardiogram 8/09 EF 60%, showed no regional wall motion abnml, mild LVH, mod focal basal septal hypertrophy and mild dyastolic dysfunction. 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 mean gradient across aortic valve was 8 mmHG. also mild L atrial enlargement and normal RV size and function. minimal AS on LHC in 7/10.   Marland Kitchen HTN (hypertension)   . GERD (gastroesophageal reflux disease)   . BPH (benign prostatic hypertrophy)   . Diverticulosis   . CAD (coronary artery disease)     LCH (7/10) totally occluded proximal RCA with very robuse L to R collaterals, 50% proximal LAD stenosis, EF 65%, medical management.    . Colon polyps   . Smoker   . TIA (transient ischemic attack)     hx  . Stroke     hx; involved brainstem; slurred speech   . Low back pain     s/p surgical fusion  . Osteoarthritis   . Chicken pox     Past Surgical History  Procedure Laterality Date  . Cholecystectomy  1994   . L4-5 discectomy  4/98    Dr. Sherwood Gambler   . Lumbar laminectomy  9/04    and microdiscectomy. L3-4. Dr. Sherwood Gambler  . Lumbar decompression and ray cage  8/98    L4-5 Dr. Rita Ohara  . Cataract surgery  01/2012/02/2012    both eyes/lens implanted  . Mouth surgery      FULL DENTURES    Current Outpatient Prescriptions on File Prior to Visit  Medication Sig Dispense Refill  . albuterol (PROVENTIL HFA;VENTOLIN HFA) 108 (90 BASE) MCG/ACT inhaler Inhale 1-2 puffs into the lungs every 6 (six) hours as needed for wheezing or shortness of breath.      Marland Kitchen aspirin 81 MG EC tablet Take 81 mg by mouth daily.        . Cetirizine HCl 10 MG CAPS Take 20 mg by mouth daily.      . cholecalciferol (VITAMIN D) 1000 UNITS tablet Take 1,000 Units by mouth daily.      . Coenzyme Q10 (CO Q-10) 200 MG CAPS Take 1 capsule by mouth daily.    0  . fluticasone (FLONASE) 50 MCG/ACT nasal spray Place 2 sprays into both nostrils daily.      . isosorbide mononitrate (IMDUR) 30 MG 24 hr tablet Take 30 mg by mouth at bedtime.      Marland Kitchen loperamide (IMODIUM A-D) 2 MG tablet Take 2 mg by mouth daily as needed for diarrhea or loose stools.       . nitroGLYCERIN (NITROSTAT)  0.4 MG SL tablet Place 0.4 mg under the tongue every 5 (five) minutes as needed for chest pain.      . Omega-3 Fatty Acids (FISH OIL) 1000 MG CAPS Take 2 capsules by mouth 2 (two) times daily.        Marland Kitchen omeprazole (PRILOSEC) 20 MG capsule Take 1 capsule (20 mg total) by mouth daily.  90 capsule  3  . vitamin B-12 (CYANOCOBALAMIN) 1000 MCG tablet Take 1,000 mcg by mouth daily.         No current facility-administered medications on file prior to visit.    Allergies  Allergen Reactions  . Celecoxib     REACTION: hives and itching    Family History  Problem Relation Age of Onset  . Alzheimer's disease Mother 18    Deceased  . Arthritis/Rheumatoid Mother   . Heart disease Father 31    Living  . Coronary artery disease Father   . Stomach cancer Maternal  Uncle   . Brain cancer Maternal Aunt     x2  . Healthy Brother   . Obesity Daughter     Had Bypass Sx   History   Social History  . Marital Status: Married    Spouse Name: etta    Number of Children: N/A  . Years of Education: N/A   Occupational History  . retired     disabled due to back problems   Social History Main Topics  . Smoking status: Former Smoker -- 1.50 packs/day for 50 years    Types: Cigarettes    Quit date: 04/06/2011  . Smokeless tobacco: Former Systems developer     Comment: smoked less than 1 ppd for 40+ years; quit 7/10  . Alcohol Use: Yes     Comment: social   . Drug Use: No  . Sexual Activity: Not on file   Other Topics Concern  . Not on file   Social History Narrative   Married (3rd), [place]. 2 children from 1st marriage, 4 step children.    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       Cell # [id number]   ROS See HPI.  All other ROS are negative.  BP 126/70  Pulse 72  Temp(Src) 97.9 F (36.6 C) (Oral)  Resp 16  Ht 5' 8" (1.727 m)  Wt 185 lb (83.915 kg)  BMI 28.14 kg/m2  SpO2 97%  Physical Exam  Vitals reviewed. Constitutional: He is oriented to person, place, and time and well-developed, well-nourished, and in no distress.  HENT:  Head: Normocephalic and atraumatic.  Right Ear: External ear normal.  Left Ear: External ear normal.  Nose: Nose normal.  Mouth/Throat: Oropharynx is clear and moist. No oropharyngeal exudate.  TM within normal limits bilaterally.  Eyes: Conjunctivae and EOM are normal. Pupils are equal, round, and reactive to light.  Neck: Neck supple. No thyromegaly present.  Cardiovascular: Normal rate, regular rhythm, normal heart sounds and intact distal pulses.   Pulmonary/Chest: Effort normal and breath sounds normal. No respiratory distress. He has no wheezes. He has no rales. He exhibits no tenderness.  Lymphadenopathy:    He has no  cervical adenopathy.  Neurological: He is alert and oriented to person, place, and time.  Skin: Skin is warm and dry. No rash noted.  Psychiatric: Affect normal.    Recent Results (from the past 2160 hour(s))  CBC WITH DIFFERENTIAL  Status: Abnormal   Collection Time    04/17/13 12:00 PM      Result Value Ref Range   WBC 8.6  4.0 - 10.5 K/uL   RBC 5.42  4.22 - 5.81 MIL/uL   Hemoglobin 15.7  13.0 - 17.0 g/dL   HCT 46.7  39.0 - 52.0 %   MCV 86.2  78.0 - 100.0 fL   MCH 29.0  26.0 - 34.0 pg   MCHC 33.6  30.0 - 36.0 g/dL   RDW 13.3  11.5 - 15.5 %   Platelets 144 (*) 150 - 400 K/uL   Neutrophils Relative % 71  43 - 77 %   Neutro Abs 6.1  1.7 - 7.7 K/uL   Lymphocytes Relative 20  12 - 46 %   Lymphs Abs 1.7  0.7 - 4.0 K/uL   Monocytes Relative 9  3 - 12 %   Monocytes Absolute 0.7  0.1 - 1.0 K/uL   Eosinophils Relative 0  0 - 5 %   Eosinophils Absolute 0.0  0.0 - 0.7 K/uL   Basophils Relative 0  0 - 1 %   Basophils Absolute 0.0  0.0 - 0.1 K/uL  BASIC METABOLIC PANEL     Status: Abnormal   Collection Time    04/17/13 12:00 PM      Result Value Ref Range   Sodium 138  137 - 147 mEq/L   Potassium 3.7  3.7 - 5.3 mEq/L   Chloride 95 (*) 96 - 112 mEq/L   CO2 27  19 - 32 mEq/L   Glucose, Bld 146 (*) 70 - 99 mg/dL   BUN 12  6 - 23 mg/dL   Creatinine, Ser 0.94  0.50 - 1.35 mg/dL   Calcium 9.2  8.4 - 10.5 mg/dL   GFR calc non Af Amer 83 (*) >90 mL/min   GFR calc Af Amer >90  >90 mL/min   Comment: (NOTE)     The eGFR has been calculated using the CKD EPI equation.     This calculation has not been validated in all clinical situations.     eGFR's persistently <90 mL/min signify possible Chronic Kidney     Disease.  COMPREHENSIVE METABOLIC PANEL     Status: None   Collection Time    05/15/13 10:42 AM      Result Value Ref Range   Sodium 137  135 - 145 mEq/L   Potassium 4.4  3.5 - 5.3 mEq/L   Chloride 101  96 - 112 mEq/L   CO2 28  19 - 32 mEq/L   Glucose, Bld 80  70 - 99 mg/dL    BUN 11  6 - 23 mg/dL   Creat 0.76  0.50 - 1.35 mg/dL   Total Bilirubin 0.4  0.2 - 1.2 mg/dL   Alkaline Phosphatase 107  39 - 117 U/L   AST 15  0 - 37 U/L   ALT 15  0 - 53 U/L   Total Protein 6.2  6.0 - 8.3 g/dL   Albumin 3.7  3.5 - 5.2 g/dL   Calcium 9.1  8.4 - 10.5 mg/dL  LIPID PANEL     Status: Abnormal   Collection Time    05/15/13 10:42 AM      Result Value Ref Range   Cholesterol 116  0 - 200 mg/dL   Comment: ATP III Classification:           < 200        mg/dL          Desirable          200 - 239     mg/dL        Borderline High          >= 240        mg/dL        High         Triglycerides 103  <150 mg/dL   HDL 29 (*) >39 mg/dL   Total CHOL/HDL Ratio 4.0     VLDL 21  0 - 40 mg/dL   LDL Cholesterol 66  0 - 99 mg/dL   Comment:       Total Cholesterol/HDL Ratio:CHD Risk                            Coronary Heart Disease Risk Table                                            Men       Women              1/2 Average Risk              3.4        3.3                  Average Risk              5.0        4.4               2X Average Risk              9.6        7.1               3X Average Risk             23.4       11.0     Use the calculated Patient Ratio above and the CHD Risk table      to determine the patient's CHD Risk.     ATP III Classification (LDL):           < 100        mg/dL         Optimal          100 - 129     mg/dL         Near or Above Optimal          130 - 159     mg/dL         Borderline High          160 - 189     mg/dL         High           > 190        mg/dL         Very High          Assessment/Plan: Hypertension Well controlled at present.  Followed by Cardiology.  BP normotensive in clinic.  Continue current regimen.  GERD Continue omeprazole.  HYPERLIPIDEMIA Will obtain lipid panel.  Giving significant myalgias and hx of failure of Lipitor and pravastatin, will attempt trial of Livalo.  Patient to return in 1 month for follow-up and repeat  labs.  Low back pain syndrome   Chronic. Will refill Norco.  Will obtain CMP.  Avoid heavy lifting or overexertion.

## 2013-05-19 DIAGNOSIS — M545 Low back pain, unspecified: Secondary | ICD-10-CM | POA: Insufficient documentation

## 2013-05-19 DIAGNOSIS — I10 Essential (primary) hypertension: Secondary | ICD-10-CM | POA: Insufficient documentation

## 2013-05-19 NOTE — Assessment & Plan Note (Signed)
Continue omeprazole 

## 2013-05-19 NOTE — Assessment & Plan Note (Signed)
Well controlled at present.  Followed by Cardiology.  BP normotensive in clinic.  Continue current regimen.

## 2013-05-19 NOTE — Assessment & Plan Note (Signed)
Chronic. Will refill Norco.  Will obtain CMP.  Avoid heavy lifting or overexertion.

## 2013-05-19 NOTE — Assessment & Plan Note (Signed)
Will obtain lipid panel.  Giving significant myalgias and hx of failure of Lipitor and pravastatin, will attempt trial of Livalo.  Patient to return in 1 month for follow-up and repeat labs.

## 2013-05-25 ENCOUNTER — Telehealth: Payer: Self-pay | Admitting: *Deleted

## 2013-05-25 DIAGNOSIS — M545 Low back pain, unspecified: Secondary | ICD-10-CM

## 2013-05-25 MED ORDER — HYDROCODONE-ACETAMINOPHEN 5-325 MG PO TABS: 0.5000 | ORAL_TABLET | Freq: Four times a day (QID) | ORAL | Status: DC | PRN

## 2013-05-25 NOTE — Telephone Encounter (Signed)
Pt left message requesting refill of hydrocodone.  Last Rx provided on 05/15/13, #30.  Please advise.

## 2013-05-25 NOTE — Telephone Encounter (Signed)
Patient informed, understood & agreed/SLS  

## 2013-05-25 NOTE — Telephone Encounter (Signed)
Refill with quantity 90.  It is my fault prescription was not written for 90 tablets.  Can pickup tomorrow.  Will be at front desk at [facility].

## 2013-06-12 ENCOUNTER — Encounter: Payer: Self-pay | Admitting: Physician Assistant

## 2013-06-12 ENCOUNTER — Ambulatory Visit (INDEPENDENT_AMBULATORY_CARE_PROVIDER_SITE_OTHER): Payer: Medicare Other | Admitting: Physician Assistant

## 2013-06-12 VITALS — BP 116/64 | HR 68 | Temp 98.0°F | Resp 16 | Ht 68.0 in | Wt 187.5 lb

## 2013-06-12 DIAGNOSIS — E785 Hyperlipidemia, unspecified: Secondary | ICD-10-CM

## 2013-06-12 NOTE — Assessment & Plan Note (Signed)
Myalgias have resolved.  Will continue Livalo.  Patient to return in 1 week for fasting lipid panel.

## 2013-06-12 NOTE — Patient Instructions (Signed)
Continue medications as directed.  Please return to lab in 1 week for fasting blood work.  I will call you with your results.

## 2013-06-12 NOTE — Progress Notes (Signed)
Pre visit review using our clinic review tool, if applicable. No additional management support is needed unless otherwise documented below in the visit note/SLS  

## 2013-06-12 NOTE — Progress Notes (Signed)
Patient presents to clinic today for follow-up of myalgias/arthralgias.  Patient previously on lipitor for hyperlipidemia. LDL < 100.  Was experiencing sever muscle aches and joint aches since starting.  Symptoms were felt to be stemming from medication.  Patient was switched to Livalo 4 mg.  Endorses resolution of symptoms.  States he is doing very well and would like to continue medication.  Past Medical History  Diagnosis Date  . HLD (hyperlipidemia)   . Aortic stenosis     mild echocardiogram 8/09 EF 60%, showed no regional wall motion abnml, mild LVH, mod focal basal septal hypertrophy and mild dyastolic dysfunction. 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 mean gradient across aortic valve was 8 mmHG. also mild L atrial enlargement and normal RV size and function. minimal AS on LHC in 7/10.   Marland Kitchen HTN (hypertension)   . GERD (gastroesophageal reflux disease)   . BPH (benign prostatic hypertrophy)   . Diverticulosis   . CAD (coronary artery disease)     LCH (7/10) totally occluded proximal RCA with very robuse L to R collaterals, 50% proximal LAD stenosis, EF 65%, medical management.    . Colon polyps   . Smoker   . TIA (transient ischemic attack)     hx  . Stroke     hx; involved brainstem; slurred speech   . Low back pain     s/p surgical fusion  . Osteoarthritis   . Chicken pox     Current Outpatient Prescriptions on File Prior to Visit  Medication Sig Dispense Refill  . albuterol (PROVENTIL HFA;VENTOLIN HFA) 108 (90 BASE) MCG/ACT inhaler Inhale 1-2 puffs into the lungs every 6 (six) hours as needed for wheezing or shortness of breath.      Marland Kitchen amLODipine (NORVASC) 10 MG tablet Take 1 tablet (10 mg total) by mouth daily.  30 tablet  3  . aspirin 81 MG EC tablet Take 81 mg by mouth daily.        . benazepril (LOTENSIN) 20 MG tablet Take 1 tablet (20 mg total) by mouth daily.  30 tablet  3  . Cetirizine HCl 10 MG CAPS Take 20 mg by mouth  daily.      . cholecalciferol (VITAMIN D) 1000 UNITS tablet Take 1,000 Units by mouth daily.      . Coenzyme Q10 (CO Q-10) 200 MG CAPS Take 1 capsule by mouth daily.    0  . fluticasone (FLONASE) 50 MCG/ACT nasal spray Place 2 sprays into both nostrils daily.      Marland Kitchen HYDROcodone-acetaminophen (NORCO/VICODIN) 5-325 MG per tablet Take 0.5-1 tablets by mouth every 6 (six) hours as needed for moderate pain.  90 tablet  0  . isosorbide mononitrate (IMDUR) 30 MG 24 hr tablet Take 30 mg by mouth at bedtime.      Marland Kitchen loperamide (IMODIUM A-D) 2 MG tablet Take 2 mg by mouth daily as needed for diarrhea or loose stools.       . metoprolol (LOPRESSOR) 50 MG tablet Take 1 tablet (50 mg total) by mouth 2 (two) times daily.  60 tablet  3  . nitroGLYCERIN (NITROSTAT) 0.4 MG SL tablet Place 0.4 mg under the tongue every 5 (five) minutes as needed for chest pain.      . Omega-3 Fatty Acids (FISH OIL) 1000 MG CAPS Take 2 capsules by mouth 2 (two) times daily.        Marland Kitchen omeprazole (PRILOSEC) 20 MG capsule Take 1  capsule (20 mg total) by mouth daily.  90 capsule  3  . Pitavastatin Calcium 4 MG TABS Take 1 tablet (4 mg total) by mouth daily.  30 tablet  3  . vitamin B-12 (CYANOCOBALAMIN) 1000 MCG tablet Take 1,000 mcg by mouth daily.         No current facility-administered medications on file prior to visit.    Allergies  Allergen Reactions  . Celecoxib     REACTION: hives and itching    Family History  Problem Relation Age of Onset  . Alzheimer's disease Mother 43    Deceased  . Arthritis/Rheumatoid Mother   . Heart disease Father 97    Living  . Coronary artery disease Father   . Stomach cancer Maternal Uncle   . Brain cancer Maternal Aunt     x2  . Healthy Brother   . Obesity Daughter     Had Bypass Sx    History   Social History  . Marital Status: Married    Spouse Name: etta    Number of Children: N/A  . Years of Education: N/A   Occupational History  . retired     disabled due to back  problems   Social History Main Topics  . Smoking status: Former Smoker -- 1.50 packs/day for 50 years    Types: Cigarettes    Quit date: 04/06/2011  . Smokeless tobacco: Former Systems developer     Comment: smoked less than 1 ppd for 40+ years; quit 7/10  . Alcohol Use: Yes     Comment: social   . Drug Use: No  . Sexual Activity: None   Other Topics Concern  . None   Social History Narrative   Married (3rd), [place]. 2 children from 1st marriage, 4 step children.    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       Cell # [id number]   Review of Systems - See HPI.  All other ROS are negative.  BP 116/64  Pulse 68  Temp(Src) 98 F (36.7 C) (Oral)  Resp 16  Ht '5\' 8"'  (1.727 m)  Wt 187 lb 8 oz (85.049 kg)  BMI 28.52 kg/m2  SpO2 97%  Physical Exam  Vitals reviewed. Constitutional: He is oriented to person, place, and time and well-developed, well-nourished, and in no distress.  HENT:  Head: Normocephalic and atraumatic.  Eyes: Conjunctivae are normal.  Cardiovascular: Normal rate, regular rhythm, normal heart sounds and intact distal pulses.   Pulmonary/Chest: Effort normal and breath sounds normal.  Neurological: He is alert and oriented to person, place, and time.  Skin: Skin is warm and dry. No rash noted.  Psychiatric: Affect normal.   Recent Results (from the past 2160 hour(s))  CBC WITH DIFFERENTIAL     Status: Abnormal   Collection Time    04/17/13 12:00 PM      Result Value Ref Range   WBC 8.6  4.0 - 10.5 K/uL   RBC 5.42  4.22 - 5.81 MIL/uL   Hemoglobin 15.7  13.0 - 17.0 g/dL   HCT 46.7  39.0 - 52.0 %   MCV 86.2  78.0 - 100.0 fL   MCH 29.0  26.0 - 34.0 pg   MCHC 33.6  30.0 - 36.0 g/dL   RDW 13.3  11.5 - 15.5 %   Platelets 144 (*) 150 - 400 K/uL   Neutrophils Relative % 71  43 - 77 %   Neutro Abs 6.1  1.7 - 7.7 K/uL   Lymphocytes Relative 20  12 - 46 %   Lymphs Abs 1.7  0.7 - 4.0 K/uL    Monocytes Relative 9  3 - 12 %   Monocytes Absolute 0.7  0.1 - 1.0 K/uL   Eosinophils Relative 0  0 - 5 %   Eosinophils Absolute 0.0  0.0 - 0.7 K/uL   Basophils Relative 0  0 - 1 %   Basophils Absolute 0.0  0.0 - 0.1 K/uL  BASIC METABOLIC PANEL     Status: Abnormal   Collection Time    04/17/13 12:00 PM      Result Value Ref Range   Sodium 138  137 - 147 mEq/L   Potassium 3.7  3.7 - 5.3 mEq/L   Chloride 95 (*) 96 - 112 mEq/L   CO2 27  19 - 32 mEq/L   Glucose, Bld 146 (*) 70 - 99 mg/dL   BUN 12  6 - 23 mg/dL   Creatinine, Ser 0.94  0.50 - 1.35 mg/dL   Calcium 9.2  8.4 - 10.5 mg/dL   GFR calc non Af Amer 83 (*) >90 mL/min   GFR calc Af Amer >90  >90 mL/min   Comment: (NOTE)     The eGFR has been calculated using the CKD EPI equation.     This calculation has not been validated in all clinical situations.     eGFR's persistently <90 mL/min signify possible Chronic Kidney     Disease.  COMPREHENSIVE METABOLIC PANEL     Status: None   Collection Time    05/15/13 10:42 AM      Result Value Ref Range   Sodium 137  135 - 145 mEq/L   Potassium 4.4  3.5 - 5.3 mEq/L   Chloride 101  96 - 112 mEq/L   CO2 28  19 - 32 mEq/L   Glucose, Bld 80  70 - 99 mg/dL   BUN 11  6 - 23 mg/dL   Creat 0.76  0.50 - 1.35 mg/dL   Total Bilirubin 0.4  0.2 - 1.2 mg/dL   Alkaline Phosphatase 107  39 - 117 U/L   AST 15  0 - 37 U/L   ALT 15  0 - 53 U/L   Total Protein 6.2  6.0 - 8.3 g/dL   Albumin 3.7  3.5 - 5.2 g/dL   Calcium 9.1  8.4 - 10.5 mg/dL  LIPID PANEL     Status: Abnormal   Collection Time    05/15/13 10:42 AM      Result Value Ref Range   Cholesterol 116  0 - 200 mg/dL   Comment: ATP III Classification:           < 200        mg/dL        Desirable          200 - 239     mg/dL        Borderline High          >= 240        mg/dL        High         Triglycerides 103  <150 mg/dL   HDL 29 (*) >39 mg/dL   Total CHOL/HDL Ratio 4.0     VLDL 21  0 - 40 mg/dL   LDL Cholesterol 66  0 - 99 mg/dL  Comment:       Total Cholesterol/HDL Ratio:CHD Risk                            Coronary Heart Disease Risk Table                                            Men       Women              1/2 Average Risk              3.4        3.3                  Average Risk              5.0        4.4               2X Average Risk              9.6        7.1               3X Average Risk             23.4       11.0     Use the calculated Patient Ratio above and the CHD Risk table      to determine the patient's CHD Risk.     ATP III Classification (LDL):           < 100        mg/dL         Optimal          100 - 129     mg/dL         Near or Above Optimal          130 - 159     mg/dL         Borderline High          160 - 189     mg/dL         High           > 190        mg/dL         Very High          Assessment/Plan: HYPERLIPIDEMIA Myalgias have resolved.  Will continue Livalo.  Patient to return in 1 week for fasting lipid panel.

## 2013-07-14 ENCOUNTER — Encounter (HOSPITAL_COMMUNITY): Payer: Self-pay | Admitting: Emergency Medicine

## 2013-07-14 ENCOUNTER — Emergency Department (HOSPITAL_COMMUNITY): Payer: Medicare Other

## 2013-07-14 ENCOUNTER — Telehealth: Payer: Self-pay | Admitting: Physician Assistant

## 2013-07-14 ENCOUNTER — Emergency Department (HOSPITAL_COMMUNITY)
Admission: EM | Admit: 2013-07-14 | Discharge: 2013-07-15 | Disposition: A | Discharge status: Home / Self Care | Payer: Medicare Other | Attending: Emergency Medicine | Admitting: Emergency Medicine

## 2013-07-14 DIAGNOSIS — Z8673 Personal history of transient ischemic attack (TIA), and cerebral infarction without residual deficits: Secondary | ICD-10-CM | POA: Insufficient documentation

## 2013-07-14 DIAGNOSIS — I10 Essential (primary) hypertension: Secondary | ICD-10-CM

## 2013-07-14 DIAGNOSIS — X500XXA Overexertion from strenuous movement or load, initial encounter: Secondary | ICD-10-CM | POA: Insufficient documentation

## 2013-07-14 DIAGNOSIS — Z860100 Personal history of colon polyps, unspecified: Secondary | ICD-10-CM | POA: Insufficient documentation

## 2013-07-14 DIAGNOSIS — M545 Low back pain, unspecified: Secondary | ICD-10-CM

## 2013-07-14 DIAGNOSIS — Z8619 Personal history of other infectious and parasitic diseases: Secondary | ICD-10-CM | POA: Insufficient documentation

## 2013-07-14 DIAGNOSIS — IMO0002 Reserved for concepts with insufficient information to code with codable children: Secondary | ICD-10-CM | POA: Insufficient documentation

## 2013-07-14 DIAGNOSIS — Z8719 Personal history of other diseases of the digestive system: Secondary | ICD-10-CM | POA: Insufficient documentation

## 2013-07-14 DIAGNOSIS — K219 Gastro-esophageal reflux disease without esophagitis: Secondary | ICD-10-CM | POA: Insufficient documentation

## 2013-07-14 DIAGNOSIS — I2510 Atherosclerotic heart disease of native coronary artery without angina pectoris: Secondary | ICD-10-CM | POA: Insufficient documentation

## 2013-07-14 DIAGNOSIS — S93609A Unspecified sprain of unspecified foot, initial encounter: Secondary | ICD-10-CM | POA: Insufficient documentation

## 2013-07-14 DIAGNOSIS — Z7982 Long term (current) use of aspirin: Secondary | ICD-10-CM | POA: Insufficient documentation

## 2013-07-14 DIAGNOSIS — E785 Hyperlipidemia, unspecified: Secondary | ICD-10-CM | POA: Insufficient documentation

## 2013-07-14 DIAGNOSIS — Z87891 Personal history of nicotine dependence: Secondary | ICD-10-CM | POA: Insufficient documentation

## 2013-07-14 DIAGNOSIS — N400 Enlarged prostate without lower urinary tract symptoms: Secondary | ICD-10-CM | POA: Insufficient documentation

## 2013-07-14 DIAGNOSIS — Z79899 Other long term (current) drug therapy: Secondary | ICD-10-CM | POA: Insufficient documentation

## 2013-07-14 DIAGNOSIS — S93602A Unspecified sprain of left foot, initial encounter: Secondary | ICD-10-CM

## 2013-07-14 DIAGNOSIS — Y929 Unspecified place or not applicable: Secondary | ICD-10-CM | POA: Insufficient documentation

## 2013-07-14 DIAGNOSIS — Y939 Activity, unspecified: Secondary | ICD-10-CM | POA: Insufficient documentation

## 2013-07-14 LAB — LIPID PANEL
CHOLESTEROL: 112 mg/dL (ref 0–200)
HDL: 30 mg/dL — ABNORMAL LOW (ref >39)
LDL CALC: 62 mg/dL (ref 0–99)
TRIGLYCERIDES: 100 mg/dL (ref <150)
Total CHOL/HDL Ratio: 3.7 Ratio
VLDL: 20 mg/dL (ref 0–40)

## 2013-07-14 MED ORDER — HYDROCODONE-ACETAMINOPHEN 5-325 MG PO TABS: 0.5000 | ORAL_TABLET | Freq: Four times a day (QID) | ORAL | Status: DC | PRN

## 2013-07-14 MED ORDER — ISOSORBIDE MONONITRATE ER 30 MG PO TB24: 30.0000 mg | ORAL_TABLET | Freq: Every day | ORAL | Status: DC

## 2013-07-14 MED ORDER — AMLODIPINE BESYLATE 10 MG PO TABS: 10.0000 mg | ORAL_TABLET | Freq: Every day | ORAL | Status: DC

## 2013-07-14 MED ORDER — BENAZEPRIL HCL 20 MG PO TABS: 20.0000 mg | ORAL_TABLET | Freq: Every day | ORAL | Status: DC

## 2013-07-14 MED ORDER — METOPROLOL TARTRATE 50 MG PO TABS: 50.0000 mg | ORAL_TABLET | Freq: Two times a day (BID) | ORAL | Status: DC

## 2013-07-14 NOTE — Telephone Encounter (Signed)
Please advise refill? Last RX was done on 05-25-13 quantity 90 with 0 refills

## 2013-07-14 NOTE — Telephone Encounter (Signed)
Patient is also requesting a new vicoden rx

## 2013-07-14 NOTE — Telephone Encounter (Signed)
Amlodipine 10 mg take 1 per day qty 30  Benazepril hci 20 mg take 1 per day qty 30 Isosorbide mononitritate 30 mg take 1 per day qty 30  Metoprolol tartrate 50 mg take 2 per day qty 60  Written rx for hyrdrocodone 5/325mg  take 1 every 6 hours as needed for pain qty 90

## 2013-07-14 NOTE — ED Provider Notes (Signed)
CSN: [id number]     Arrival date & time 07/14/13  2250 History   None   This chart was scribed for non-physician practitioner, Antonietta Breach, PA, working with Julianne Rice, MD by Terressa Koyanagi, ED Scribe. This patient was seen in room WTR7/WTR7 and the patient's care was started at 11:52 PM.  Chief Complaint  Patient presents with  . Foot Injury   HPI HPI Comments: Brandon [name] is a 73 y.o. male, with an extensive medical history including HLD, HTN, CAD, TIA, stroke, GERD, who presents to the Emergency Department complaining of injury to his left foot onset earlier today when he dropped a large pipe on his left foot. Pt reports he was ambulatory following the accident and went to a meeting. However, as the day progressed the pain worsened and at this point he states it is painful to put weight on the left foot. Pt further states that the left foot is extremely tender to palpation. Pt rates his pain a 9 out of 10. Pt takes baby aspirin everyday, but, denies being on any other blood thinners.     Past Medical History  Diagnosis Date  . HLD (hyperlipidemia)   . Aortic stenosis     mild echocardiogram 8/09 EF 60%, showed no regional wall motion abnml, mild LVH, mod focal basal septal hypertrophy and mild dyastolic dysfunction. 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 mean gradient across aortic valve was 8 mmHG. also mild L atrial enlargement and normal RV size and function. minimal AS on LHC in 7/10.   Marland Kitchen HTN (hypertension)   . GERD (gastroesophageal reflux disease)   . BPH (benign prostatic hypertrophy)   . Diverticulosis   . CAD (coronary artery disease)     LCH (7/10) totally occluded proximal RCA with very robuse L to R collaterals, 50% proximal LAD stenosis, EF 65%, medical management.    . Colon polyps   . Smoker   . TIA (transient ischemic attack)     hx  . Stroke     hx; involved brainstem; slurred speech   . Low back pain     s/p surgical  fusion  . Osteoarthritis   . Chicken pox    Past Surgical History  Procedure Laterality Date  . Cholecystectomy  1994  . L4-5 discectomy  4/98    Dr. Sherwood Gambler   . Lumbar laminectomy  9/04    and microdiscectomy. L3-4. Dr. Sherwood Gambler  . Lumbar decompression and ray cage  8/98    L4-5 Dr. Rita Ohara  . Cataract surgery  01/2012/02/2012    both eyes/lens implanted  . Mouth surgery      FULL DENTURES   Family History  Problem Relation Age of Onset  . Alzheimer's disease Mother 59    Deceased  . Arthritis/Rheumatoid Mother   . Heart disease Father 90    Living  . Coronary artery disease Father   . Stomach cancer Maternal Uncle   . Brain cancer Maternal Aunt     x2  . Healthy Brother   . Obesity Daughter     Had Bypass Sx   History  Substance Use Topics  . Smoking status: Former Smoker -- 1.50 packs/day for 50 years    Types: Cigarettes    Quit date: 04/06/2011  . Smokeless tobacco: Former Systems developer     Comment: smoked less than 1 ppd for 40+ years; quit 7/10  . Alcohol Use: Yes     Comment: social  Review of Systems  Musculoskeletal: Positive for arthralgias and myalgias.       Left foot pain  Skin: Negative for pallor.  Neurological: Negative for weakness and numbness.  All other systems reviewed and are negative.    Allergies  Celecoxib  Home Medications   Prior to Admission medications   Medication Sig Start Date End Date Taking? Authorizing Provider  albuterol (PROVENTIL HFA;VENTOLIN HFA) 108 (90 BASE) MCG/ACT inhaler Inhale 1-2 puffs into the lungs every 6 (six) hours as needed for wheezing or shortness of breath.    Historical Provider, MD  amLODipine (NORVASC) 10 MG tablet Take 1 tablet (10 mg total) by mouth daily. 07/14/13   Leeanne Rio, PA-C  aspirin 81 MG EC tablet Take 81 mg by mouth daily.      Historical Provider, MD  benazepril (LOTENSIN) 20 MG tablet Take 1 tablet (20 mg total) by mouth daily. 07/14/13   Leeanne Rio, PA-C  Cetirizine HCl  10 MG CAPS Take 20 mg by mouth daily.    Historical Provider, MD  cholecalciferol (VITAMIN D) 1000 UNITS tablet Take 1,000 Units by mouth daily.    Historical Provider, MD  Coenzyme Q10 (CO Q-10) 200 MG CAPS Take 1 capsule by mouth daily. 09/01/10   Larey Dresser, MD  fluticasone (FLONASE) 50 MCG/ACT nasal spray Place 2 sprays into both nostrils daily.    Historical Provider, MD  HYDROcodone-acetaminophen (NORCO/VICODIN) 5-325 MG per tablet Take 0.5-1 tablets by mouth every 6 (six) hours as needed for moderate pain. 07/14/13   Leeanne Rio, PA-C  isosorbide mononitrate (IMDUR) 30 MG 24 hr tablet Take 1 tablet (30 mg total) by mouth at bedtime. 07/14/13   Leeanne Rio, PA-C  loperamide (IMODIUM A-D) 2 MG tablet Take 2 mg by mouth daily as needed for diarrhea or loose stools.     Historical Provider, MD  metoprolol (LOPRESSOR) 50 MG tablet Take 1 tablet (50 mg total) by mouth 2 (two) times daily. 07/14/13   Leeanne Rio, PA-C  nitroGLYCERIN (NITROSTAT) 0.4 MG SL tablet Place 0.4 mg under the tongue every 5 (five) minutes as needed for chest pain.    Historical Provider, MD  Omega-3 Fatty Acids (FISH OIL) 1000 MG CAPS Take 2 capsules by mouth 2 (two) times daily.      Historical Provider, MD  omeprazole (PRILOSEC) 20 MG capsule Take 1 capsule (20 mg total) by mouth daily. 01/27/13   Noralee Space, MD  oxyCODONE-acetaminophen (PERCOCET/ROXICET) 5-325 MG per tablet Take 1-2 tablets by mouth every 6 (six) hours as needed for moderate pain or severe pain. 07/14/13   Antonietta Breach, PA-C  Pitavastatin Calcium 4 MG TABS Take 1 tablet (4 mg total) by mouth daily. 05/15/13   Leeanne Rio, PA-C  vitamin B-12 (CYANOCOBALAMIN) 1000 MCG tablet Take 1,000 mcg by mouth daily.      Historical Provider, MD   Triage Vitals: BP 124/63  Pulse 73  Temp(Src) 98.3 F (36.8 C) (Oral)  Resp 16  SpO2 96%  Physical Exam  Nursing note and vitals reviewed. Constitutional: He is oriented to person, place,  and time. He appears well-developed and well-nourished. No distress.  Nontoxic/nonseptic appearing  HENT:  Head: Normocephalic and atraumatic.  Eyes: Conjunctivae and EOM are normal. No scleral icterus.  Neck: Normal range of motion.  Cardiovascular: Normal rate, regular rhythm and intact distal pulses.   DP and PT pulses 2+ and left lower extremity. Capillary refill normal in all digits of left  foot  Pulmonary/Chest: Effort normal. No respiratory distress.  Musculoskeletal:  Normal range of motion of left ankle. Patient with tenderness to palpation to the dorsal aspect of proximal left foot with associated soft tissue swelling. No crepitus or deformity. No effusion.  Neurological: He is alert and oriented to person, place, and time. He exhibits normal muscle tone. Coordination normal.  No gross sensory deficits appreciated. Patient able to wiggle all toes.  Skin: Skin is warm and dry. No rash noted. He is not diaphoretic. No erythema. No pallor.  Psychiatric: He has a normal mood and affect. His behavior is normal.    ED Course  Procedures (including critical care time) DIAGNOSTIC STUDIES: Oxygen Saturation is 96% on RA, adequate by my interpretation.    COORDINATION OF CARE: 11:55 PM-Discussed treatment plan which includes meds, crutches with pt at bedside and pt agreed to plan. Patient verbalizes understanding and agrees with treatment plan.  Labs Review Labs Reviewed - No data to display  Imaging Review Dg Foot Complete Left  07/14/2013   CLINICAL DATA:  Dropped metal pipe on  foot.  EXAM: LEFT FOOT - COMPLETE 3+ VIEW  COMPARISON:  None.  FINDINGS: There is no evidence of fracture or dislocation. There is no evidence of arthropathy or other focal bone abnormality. Soft tissues are unremarkable.  IMPRESSION: Negative.   Electronically Signed   By: Elon Alas   On: 07/14/2013 23:45     EKG Interpretation None      MDM   Final diagnoses:  Sprain of foot, left     Uncomplicated sprain of left foot. Patient neurovascularly intact. No sensory deficits appreciated. Soft tissue swelling and tenderness noted as described above. No crepitus, deformity, or effusion. X-ray negative for fracture or dislocation. Patient given ACE wrap and postop shoe as well as crutches for symptom management. RICE advised. Will prescribe short course of Percocet for pain control. Return precautions discussed and primary care followup advised. Patient agreeable to plan with no unaddressed concerns.  I personally performed the services described in this documentation, which was scribed in my presence. The recorded information has been reviewed and is accurate.   Filed Vitals:   07/14/13 2309  BP: 124/63  Pulse: 73  Temp: 98.3 F (36.8 C)  TempSrc: Oral  Resp: 16  SpO2: 96%       Antonietta Breach, PA-C 07/15/13 0206

## 2013-07-14 NOTE — Telephone Encounter (Signed)
Ok to print Rx for vicodin, same strength, dose and quantity.  I will sign tomorrow when I am back in office and leave at front desk for pickup.

## 2013-07-14 NOTE — ED Notes (Signed)
Pt reports dropping a 10ft. 2 inches wide pipe on his L foot today.  Non weight bearing.  Mild swelling noted at this time.

## 2013-07-14 NOTE — Telephone Encounter (Signed)
Rx printed and placed on Provider's desk for signature.

## 2013-07-15 MED ORDER — OXYCODONE-ACETAMINOPHEN 5-325 MG PO TABS
2.0000 | ORAL_TABLET | Freq: Once | ORAL | Status: AC
  Administered 2013-07-15: 2 via ORAL
  Filled 2013-07-15: qty 2

## 2013-07-15 MED ORDER — OXYCODONE-ACETAMINOPHEN 5-325 MG PO TABS: 1.0000 | ORAL_TABLET | Freq: Four times a day (QID) | ORAL | Status: DC | PRN

## 2013-07-15 NOTE — ED Notes (Signed)
Patient is alert and oriented x3.  He was given DC instructions and follow up visit instructions.  Patient gave verbal understanding.  He was DC ambulatory under his own power to home.  V/S stable.  He was not showing any signs of distress on DC 

## 2013-07-15 NOTE — Telephone Encounter (Signed)
Patient Unavailable; spouse informed and she will p/u at front desk, hours of operation given; understood & agreed/SLS

## 2013-07-15 NOTE — Discharge Instructions (Signed)
Foot Sprain The muscles and cord like structures which attach muscle to bone (tendons) that surround the feet are made up of units. A foot sprain can occur at the weakest spot in any of these units. This condition is most often caused by injury to or overuse of the foot, as from playing contact sports, or aggravating a previous injury, or from poor conditioning, or obesity. SYMPTOMS  Pain with movement of the foot.  Tenderness and swelling at the injury site.  Loss of strength is present in moderate or severe sprains. THE THREE GRADES OR SEVERITY OF FOOT SPRAIN ARE:  Mild (Grade I): Slightly pulled muscle without tearing of muscle or tendon fibers or loss of strength.  Moderate (Grade II): Tearing of fibers in a muscle, tendon, or at the attachment to bone, with small decrease in strength.  Severe (Grade III): Rupture of the muscle-tendon-bone attachment, with separation of fibers. Severe sprain requires surgical repair. Often repeating (chronic) sprains are caused by overuse. Sudden (acute) sprains are caused by direct injury or over-use. DIAGNOSIS  Diagnosis of this condition is usually by your own observation. If problems continue, a caregiver may be required for further evaluation and treatment. X-rays may be required to make sure there are not breaks in the bones (fractures) present. Continued problems may require physical therapy for treatment. PREVENTION  Use strength and conditioning exercises appropriate for your sport.  Warm up properly prior to working out.  Use athletic shoes that are made for the sport you are participating in.  Allow adequate time for healing. Early return to activities makes repeat injury more likely, and can lead to an unstable arthritic foot that can result in prolonged disability. Mild sprains generally heal in 3 to 10 days, with moderate and severe sprains taking 2 to 10 weeks. Your caregiver can help you determine the proper time required for  healing. HOME CARE INSTRUCTIONS   Apply ice to the injury for 15-20 minutes, 03-04 times per day. Put the ice in a plastic bag and place a towel between the bag of ice and your skin.  An elastic wrap (like an Ace bandage) may be used to keep swelling down.  Keep foot above the level of the heart, or at least raised on a footstool, when swelling and pain are present.  Try to avoid use other than gentle range of motion while the foot is painful. Do not resume use until instructed by your caregiver. Then begin use gradually, not increasing use to the point of pain. If pain does develop, decrease use and continue the above measures, gradually increasing activities that do not cause discomfort, until you gradually achieve normal use.  Use crutches if and as instructed, and for the length of time instructed.  Keep injured foot and ankle wrapped between treatments.  Massage foot and ankle for comfort and to keep swelling down. Massage from the toes up towards the knee.  Only take over-the-counter or prescription medicines for pain, discomfort, or fever as directed by your caregiver. SEEK IMMEDIATE MEDICAL CARE IF:   Your pain and swelling increase, or pain is not controlled with medications.  You have loss of feeling in your foot or your foot turns cold or blue.  You develop new, unexplained symptoms, or an increase of the symptoms that brought you to your caregiver. MAKE SURE YOU:   Understand these instructions.  Will watch your condition.  Will get help right away if you are not doing well or get worse. Document Released:  07/14/2001 Document Revised: 04/16/2011 Document Reviewed: 09/11/2007 [facility] Patient Information 2014 [place]. RICE: Routine Care for Injuries The routine care of many injuries includes Rest, Ice, Compression, and Elevation (RICE). HOME CARE INSTRUCTIONS  Rest is needed to allow your body to heal. Routine activities can usually be resumed when  comfortable. Injured tendons and bones can take up to 6 weeks to heal. Tendons are the cord-like structures that attach muscle to bone.  Ice following an injury helps keep the swelling down and reduces pain.  Put ice in a plastic bag.  Place a towel between your skin and the bag.  Leave the ice on for 15-20 minutes, 03-04 times a day. Do this while awake, for the first 24 to 48 hours. After that, continue as directed by your caregiver.  Compression helps keep swelling down. It also gives support and helps with discomfort. If an elastic bandage has been applied, it should be removed and reapplied every 3 to 4 hours. It should not be applied tightly, but firmly enough to keep swelling down. Watch fingers or toes for swelling, bluish discoloration, coldness, numbness, or excessive pain. If any of these problems occur, remove the bandage and reapply loosely. Contact your caregiver if these problems continue.  Elevation helps reduce swelling and decreases pain. With extremities, such as the arms, hands, legs, and feet, the injured area should be placed near or above the level of the heart, if possible. SEEK IMMEDIATE MEDICAL CARE IF:  You have persistent pain and swelling.  You develop redness, numbness, or unexpected weakness.  Your symptoms are getting worse rather than improving after several days. These symptoms may indicate that further evaluation or further X-rays are needed. Sometimes, X-rays may not show a small broken bone (fracture) until 1 week or 10 days later. Make a follow-up appointment with your caregiver. Ask when your X-ray results will be ready. Make sure you get your X-ray results. Document Released: 05/06/2000 Document Revised: 04/16/2011 Document Reviewed: 06/23/2010 [facility] Patient Information 2014 [place], [place].

## 2013-07-15 NOTE — ED Provider Notes (Signed)
Medical screening examination/treatment/procedure(s) were performed by non-physician practitioner and as supervising physician I was immediately available for consultation/collaboration.   EKG Interpretation None        Julianne Rice, MD 07/15/13 [phone]

## 2013-07-17 ENCOUNTER — Other Ambulatory Visit: Payer: Self-pay | Admitting: *Deleted

## 2013-07-17 DIAGNOSIS — E785 Hyperlipidemia, unspecified: Secondary | ICD-10-CM

## 2013-07-17 MED ORDER — PITAVASTATIN CALCIUM 4 MG PO TABS: 1.0000 | ORAL_TABLET | Freq: Every day | ORAL | Status: DC

## 2013-07-17 NOTE — Progress Notes (Signed)
Rx to pharmacy at pt request/SLS

## 2013-07-28 ENCOUNTER — Ambulatory Visit: Payer: Medicare Other | Admitting: Pulmonary Disease

## 2013-08-10 ENCOUNTER — Other Ambulatory Visit: Payer: Self-pay | Admitting: Physician Assistant

## 2013-08-18 ENCOUNTER — Telehealth: Payer: Self-pay | Admitting: Physician Assistant

## 2013-08-18 DIAGNOSIS — M5440 Lumbago with sciatica, unspecified side: Secondary | ICD-10-CM

## 2013-08-18 NOTE — Telephone Encounter (Signed)
Patient left message requesting refill on Hydrocodone, call when ready for pick up

## 2013-08-19 MED ORDER — HYDROCODONE-ACETAMINOPHEN 5-325 MG PO TABS: 0.5000 | ORAL_TABLET | Freq: Four times a day (QID) | ORAL | Status: DC | PRN

## 2013-08-19 NOTE — Telephone Encounter (Signed)
Refill ready for pickup.  Please inform patient.

## 2013-08-19 NOTE — Telephone Encounter (Signed)
Refill request for Hydrocodone 5-325 mg Last filled by MD on - 05.08.15, 390x0 Last AEX - 05.08.15 Next AEX - Not stated Please Advise on refills/SLS

## 2013-08-20 NOTE — Telephone Encounter (Signed)
Patient informed, understood & agreed/SLS  

## 2013-09-07 ENCOUNTER — Other Ambulatory Visit: Payer: Self-pay | Admitting: Family Medicine

## 2013-09-25 ENCOUNTER — Other Ambulatory Visit: Payer: Self-pay

## 2013-09-25 DIAGNOSIS — M5440 Lumbago with sciatica, unspecified side: Secondary | ICD-10-CM

## 2013-09-25 MED ORDER — HYDROCODONE-ACETAMINOPHEN 5-325 MG PO TABS: 0.5000 | ORAL_TABLET | Freq: Four times a day (QID) | ORAL | Status: DC | PRN

## 2013-09-25 NOTE — Telephone Encounter (Signed)
Lov 06/12/13 Lrd 08/19/2013  LDM

## 2013-10-05 ENCOUNTER — Other Ambulatory Visit: Payer: Self-pay | Admitting: Family Medicine

## 2013-10-09 NOTE — Telephone Encounter (Signed)
Sent!

## 2013-10-09 NOTE — Telephone Encounter (Signed)
°  Caller name:Emmerich,Etta Relation to pt: spouse  Call back number: [phone] Pharmacy: [facility] [phone]   Reason for call:  Pt requesting refill isosorbide mononitrate (IMDUR) 30 MG 24 hr tablet & benazepril (LOTENSIN) 20 MG tablet

## 2013-11-03 ENCOUNTER — Telehealth: Payer: Self-pay | Admitting: Physician Assistant

## 2013-11-03 DIAGNOSIS — M5440 Lumbago with sciatica, unspecified side: Secondary | ICD-10-CM

## 2013-11-03 MED ORDER — HYDROCODONE-ACETAMINOPHEN 5-325 MG PO TABS: 0.5000 | ORAL_TABLET | Freq: Four times a day (QID) | ORAL | Status: DC | PRN

## 2013-11-03 NOTE — Telephone Encounter (Signed)
Will refill with quantity 30.  Needs to be seen for controlled substance agreement discussion and signature. Also due for Medicare Wellness Exam.

## 2013-11-03 NOTE — Telephone Encounter (Signed)
Refill request for Hydrocodone  5-325 mg Last filled by MD on - 08.21.15, #90x0 Last AEX - 05.08.15 Next AEX - Due for Annual Wellness Exam Please Advise on refills/SLS

## 2013-11-03 NOTE — Telephone Encounter (Signed)
Caller name:Brandon [name] Relation to DP:TELMRA Call back [place]:  Reason for call: pt is needing new rx HYDROcodone-acetaminophen (NORCO/VICODIN) 5-325 MG per tablet Please call when available for pick up

## 2013-11-06 NOTE — Telephone Encounter (Signed)
Pt in need of 60 days refill 3 tablets a day. Pt filled 30day RX  and he states you normally give him 90 days. Pt is not in no rush to receive just wanted to make you aware.

## 2013-11-07 NOTE — Telephone Encounter (Signed)
SEE PREVIOUS NOTE. As stated previously, he needs to be seen to sign a controlled substance contract before we can continue refills.  This is a new office policy.  Once he is seen by me and we sign the contract, then we will resume the same quantity as before.

## 2013-11-11 ENCOUNTER — Other Ambulatory Visit: Payer: Self-pay | Admitting: Physician Assistant

## 2013-11-12 ENCOUNTER — Encounter: Payer: Self-pay | Admitting: Physician Assistant

## 2013-11-20 ENCOUNTER — Telehealth: Payer: Self-pay | Admitting: Physician Assistant

## 2013-11-20 NOTE — Telephone Encounter (Signed)
Caller name: [name] Relation to pt: Spouse Call back [place]:  Reason for call: Is requesting for prescription HYDROcodone-acetaminophen (NORCO/VICODIN) 5-325 MG per tablet.

## 2013-11-20 NOTE — Telephone Encounter (Signed)
Medication Detail     Disp Refills Start End     HYDROcodone-acetaminophen (NORCO/VICODIN) 5-325 MG per tablet 30 tablet 0 11/03/2013     Sig - Route: Take 0.5-1 tablets by mouth every 6 (six) hours as needed for moderate pain. - Oral    Class: Print    Notes to Pharmacy: No refills until appointment and controlled substance contract is signed.   Rx Denied: Please call patient and scheduled F/U Office visit as he was given iunstructions when given Rx on 09.29.15 that No further refills would be granted w/o Appointment [&controlled substance contract]/SLS Thanks.

## 2013-11-23 ENCOUNTER — Ambulatory Visit (INDEPENDENT_AMBULATORY_CARE_PROVIDER_SITE_OTHER): Payer: Medicare Other | Admitting: Physician Assistant

## 2013-11-23 ENCOUNTER — Encounter: Payer: Self-pay | Admitting: Physician Assistant

## 2013-11-23 VITALS — BP 120/74 | HR 66 | Ht 70.0 in | Wt 185.2 lb

## 2013-11-23 VITALS — BP 113/69 | HR 62 | Temp 97.8°F | Resp 16 | Ht 68.0 in | Wt 184.0 lb

## 2013-11-23 DIAGNOSIS — R42 Dizziness and giddiness: Secondary | ICD-10-CM | POA: Insufficient documentation

## 2013-11-23 DIAGNOSIS — J302 Other seasonal allergic rhinitis: Secondary | ICD-10-CM

## 2013-11-23 DIAGNOSIS — E785 Hyperlipidemia, unspecified: Secondary | ICD-10-CM

## 2013-11-23 DIAGNOSIS — I2510 Atherosclerotic heart disease of native coronary artery without angina pectoris: Secondary | ICD-10-CM

## 2013-11-23 DIAGNOSIS — M545 Low back pain: Secondary | ICD-10-CM

## 2013-11-23 DIAGNOSIS — I359 Nonrheumatic aortic valve disorder, unspecified: Secondary | ICD-10-CM

## 2013-11-23 DIAGNOSIS — M5440 Lumbago with sciatica, unspecified side: Secondary | ICD-10-CM

## 2013-11-23 DIAGNOSIS — I10 Essential (primary) hypertension: Secondary | ICD-10-CM

## 2013-11-23 MED ORDER — FLUTICASONE PROPIONATE 50 MCG/ACT NA SUSP: 2.0000 | Freq: Every day | NASAL | Status: DC

## 2013-11-23 MED ORDER — HYDROCODONE-ACETAMINOPHEN 5-325 MG PO TABS: 0.5000 | ORAL_TABLET | Freq: Four times a day (QID) | ORAL | Status: DC | PRN

## 2013-11-23 NOTE — Progress Notes (Signed)
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 He also has minimal aortic stenosis on cath in 2010. Last echo in 08/2012 showed normal LV size with mild LVH EF 60-65%, mild aortic stenosis with mildly dilated aortic root. He also has hypertension and hyperlipidemia.  Patient comes in today for yearly followup. He denies any chest pain, palpitations, dyspnea, dyspnea on exertion, or presyncope. He does get a little dizzy when he first stands up. That's been going on for the past few months. He has gained some weight but attributes it to eating ice cream every evening. His main complaint today is allergies. He is not using his Flonase or antihistamine.  Patient states his 33 year old father had TAVR at [place] this year. He is doing extremely well.  Allergies  Allergen Reactions  . Celecoxib     REACTION: hives and itching     Current Outpatient Prescriptions  Medication Sig Dispense Refill  . amLODipine (NORVASC) 10 MG tablet Take 1 tablet (10 mg total) by mouth daily.  30 tablet  0  . aspirin 81 MG EC tablet Take 81 mg by mouth daily.        . benazepril (LOTENSIN) 20 MG tablet TAKE 1 TABLET (20 MG TOTAL) BY MOUTH DAILY.  30 tablet  6  . cholecalciferol (VITAMIN D) 1000 UNITS tablet Take 1,000 Units by mouth daily.      . Coenzyme Q10 (CO Q-10) 200 MG CAPS Take 1 capsule by mouth daily.    0  . fluticasone (FLONASE) 50 MCG/ACT nasal spray Place 2 sprays into both nostrils daily.      Marland Kitchen HYDROcodone-acetaminophen (NORCO/VICODIN) 5-325 MG per tablet Take 0.5-1 tablets by mouth every 6 (six) hours as needed for moderate pain.  30 tablet  0  . isosorbide mononitrate (IMDUR) 30 MG 24 hr tablet TAKE 1 TABLET BY MOUTH AT BEDTIME  30 tablet  6  . loperamide (IMODIUM A-D) 2 MG tablet Take 2 mg by mouth daily as needed for  diarrhea or loose stools.       . metoprolol (LOPRESSOR) 50 MG tablet Take 1 tablet (50 mg total) by mouth 2 (two) times daily.  60 tablet  0  . nitroGLYCERIN (NITROSTAT) 0.4 MG SL tablet Place 0.4 mg under the tongue every 5 (five) minutes as needed for chest pain.      . Omega-3 Fatty Acids (FISH OIL) 1000 MG CAPS Take 2 capsules by mouth 2 (two) times daily.        Marland Kitchen omeprazole (PRILOSEC) 20 MG capsule Take 1 capsule (20 mg total) by mouth daily.  90 capsule  3  . Pitavastatin Calcium 4 MG TABS Take 1 tablet (4 mg total) by mouth daily.  30 tablet  3  . vitamin B-12 (CYANOCOBALAMIN) 1000 MCG tablet Take 1,000 mcg by mouth daily.        Marland Kitchen albuterol (PROVENTIL HFA;VENTOLIN HFA) 108 (90 BASE) MCG/ACT inhaler Inhale 1-2 puffs into the lungs every 6 (six) hours as needed for wheezing or shortness of breath.      . Cetirizine HCl 10 MG CAPS Take 20 mg by mouth daily.       No current facility-administered medications for this visit.    Past Medical History  Diagnosis Date  . HLD (hyperlipidemia)   . Aortic stenosis  mild echocardiogram 8/09 EF 60%, showed no regional wall motion abnml, mild LVH, mod focal basal septal hypertrophy and mild dyastolic dysfunction. 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 mean gradient across aortic valve was 8 mmHG. also mild L atrial enlargement and normal RV size and function. minimal AS on LHC in 7/10.   Marland Kitchen HTN (hypertension)   . GERD (gastroesophageal reflux disease)   . BPH (benign prostatic hypertrophy)   . Diverticulosis   . CAD (coronary artery disease)     LCH (7/10) totally occluded proximal RCA with very robuse L to R collaterals, 50% proximal LAD stenosis, EF 65%, medical management.    . Colon polyps   . Smoker   . TIA (transient ischemic attack)     hx  . Stroke     hx; involved brainstem; slurred speech   . Low back pain     s/p surgical fusion  . Osteoarthritis   . Chicken pox     Past  Surgical History  Procedure Laterality Date  . Cholecystectomy  1994  . L4-5 discectomy  4/98    Dr. Sherwood Gambler   . Lumbar laminectomy  9/04    and microdiscectomy. L3-4. Dr. Sherwood Gambler  . Lumbar decompression and ray cage  8/98    L4-5 Dr. Rita Ohara  . Cataract surgery  01/2012/02/2012    both eyes/lens implanted  . Mouth surgery      FULL DENTURES    Family History  Problem Relation Age of Onset  . Alzheimer's disease Mother 68    Deceased  . Arthritis/Rheumatoid Mother   . Heart disease Father 90    Living  . Coronary artery disease Father   . Stomach cancer Maternal Uncle   . Brain cancer Maternal Aunt     x2  . Healthy Brother   . Obesity Daughter     Had Bypass Sx    History   Social History  . Marital Status: Married    Spouse Name: etta    Number of Children: N/A  . Years of Education: N/A   Occupational History  . retired     disabled due to back problems   Social History Main Topics  . Smoking status: Former Smoker -- 1.50 packs/day for 50 years    Types: Cigarettes    Quit date: 04/06/2011  . Smokeless tobacco: Former Systems developer     Comment: smoked less than 1 ppd for 40+ years; quit 7/10  . Alcohol Use: Yes     Comment: social   . Drug Use: No  . Sexual Activity: Not on file   Other Topics Concern  . Not on file   Social History Narrative   Married (3rd), [place]. 2 children from 1st marriage, 4 step children.    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       Cell # [id number]    ROS: See history of present illness otherwise negative  BP 120/74  Pulse 66  Ht 5\' 10"  (1.778 m)  Wt 185 lb 3.2 oz (84.006 kg)  BMI 26.57 kg/m2  PHYSICAL EXAM: Well-nournished, in no acute distress. Neck: Bruits versus murmur portrayed, No JVD, HJR, Bruit, or thyroid enlargement  Lungs: No tachypnea, clear without wheezing, rales, or rhonchi  Cardiovascular: RRR, PMI not displaced,  normal S1-S2, 2/6 harsh systolic murmur at the left sternal border and right sternal  border, no bruit, thrill, or heave.  Abdomen: BS normal. Soft without organomegaly, masses, lesions or tenderness.  Extremities: without cyanosis, clubbing or edema. Good distal pulses bilateral  SKin: Warm, no lesions or rashes   Musculoskeletal: No deformities  Neuro: no focal signs   Wt Readings from Last 3 Encounters:  11/23/13 185 lb 3.2 oz (84.006 kg)  06/12/13 187 lb 8 oz (85.049 kg)  05/15/13 185 lb (83.915 kg)    Lab Results  Component Value Date   WBC 8.6 04/17/2013   HGB 15.7 04/17/2013   HCT 46.7 04/17/2013   PLT 144* 04/17/2013   GLUCOSE 80 05/15/2013   CHOL 112 07/14/2013   TRIG 100 07/14/2013   HDL 30* 07/14/2013   LDLDIRECT 81.2 05/25/2008   LDLCALC 62 07/14/2013   ALT 15 05/15/2013   AST 15 05/15/2013   NA 137 05/15/2013   K 4.4 05/15/2013   CL 101 05/15/2013   CREATININE 0.76 05/15/2013   BUN 11 05/15/2013   CO2 28 05/15/2013   TSH 1.08 01/27/2013   PSA 4.95* 01/27/2013   INR 0.9 ratio 08/30/2008    EKG: Normal sinus rhythm with PVC nonspecific ST-T wave changes unchanged from 04/18/13

## 2013-11-23 NOTE — Assessment & Plan Note (Signed)
Blood pressure controlled on current medications 

## 2013-11-23 NOTE — Assessment & Plan Note (Signed)
Patient is not orthostatic today. I told him to take his time when he changes positions. He is to call if he has any worsening in his symptoms

## 2013-11-23 NOTE — Assessment & Plan Note (Signed)
Patient has mild aortic stenosis on 2-D echo in July 2014. He has had no change in his symptoms and still has a preserved S2 on exam. Do not feel the need to repeat 2-D echo at this time. Continue to follow.

## 2013-11-23 NOTE — Assessment & Plan Note (Signed)
UDS obtained. Results pending.  CSC has already been signed.  Medication refilled.  Follow-up in 6 months.

## 2013-11-23 NOTE — Assessment & Plan Note (Signed)
Flonase refilled.  Encouraged patient to take Flonase and Zyrtec daily as directed.

## 2013-11-23 NOTE — Progress Notes (Signed)
Pre visit review using our clinic review tool, if applicable. No additional management support is needed unless otherwise documented below in the visit note/SLS  

## 2013-11-23 NOTE — Assessment & Plan Note (Signed)
Patient denies any chest pain and remains quite active. Continue aspirin, metoprolol and Imdur

## 2013-11-23 NOTE — Patient Instructions (Addendum)
Continue pain medication as directed.  Continue all other medications.  Follow-up in 6 months.  Take Zyrtec (Cetirizine) daily for Allergy symptoms.  Use Flonase as directed daily -- Two squirts into each nostril once daily.  Saline nasal spray may also be beneficial.

## 2013-11-23 NOTE — Assessment & Plan Note (Signed)
Lipid panel in June was stable

## 2013-11-23 NOTE — Assessment & Plan Note (Signed)
Resume Flonase and antihistamine such as Claritin or Allegra.

## 2013-11-23 NOTE — Progress Notes (Signed)
Patient presents to clinic today for medication management.  Due to new office policy, patient needs controlled substance contract signed before refills granted.  Also due for UDS per new policy.  [place] controlled substance database reviewed.  No abnormal findings.  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  Endorses taking medication as directed with last dose this morning. States he was given CSC when he picked up last refill. Also endorses giving urine sample.  Will need to check with lab to verify this.  Patient also c/o of seasonal allergies symptoms.  Is not taking his Flonase or daily antihistamine.  Past Medical History  Diagnosis Date  . HLD (hyperlipidemia)   . Aortic stenosis     mild echocardiogram 8/09 EF 60%, showed no regional wall motion abnml, mild LVH, mod focal basal septal hypertrophy and mild dyastolic dysfunction. 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 mean gradient across aortic valve was 8 mmHG. also mild L atrial enlargement and normal RV size and function. minimal AS on LHC in 7/10.   Marland Kitchen HTN (hypertension)   . GERD (gastroesophageal reflux disease)   . BPH (benign prostatic hypertrophy)   . Diverticulosis   . CAD (coronary artery disease)     LCH (7/10) totally occluded proximal RCA with very robuse L to R collaterals, 50% proximal LAD stenosis, EF 65%, medical management.    . Colon polyps   . Smoker   . TIA (transient ischemic attack)     hx  . Stroke     hx; involved brainstem; slurred speech   . Low back pain     s/p surgical fusion  . Osteoarthritis   . Chicken pox     Current Outpatient Prescriptions on File Prior to Visit  Medication Sig Dispense Refill  . albuterol (PROVENTIL HFA;VENTOLIN HFA) 108 (90 BASE) MCG/ACT inhaler Inhale 1-2 puffs into the lungs every 6 (six) hours as needed for wheezing or shortness of breath.      Marland Kitchen amLODipine (NORVASC) 10 MG tablet Take 1 tablet (10 mg total)  by mouth daily.  30 tablet  0  . aspirin 81 MG EC tablet Take 81 mg by mouth daily.        . benazepril (LOTENSIN) 20 MG tablet TAKE 1 TABLET (20 MG TOTAL) BY MOUTH DAILY.  30 tablet  6  . Cetirizine HCl 10 MG CAPS Take 20 mg by mouth daily.      . cholecalciferol (VITAMIN D) 1000 UNITS tablet Take 1,000 Units by mouth daily.      . Coenzyme Q10 (CO Q-10) 200 MG CAPS Take 1 capsule by mouth daily.    0  . isosorbide mononitrate (IMDUR) 30 MG 24 hr tablet TAKE 1 TABLET BY MOUTH AT BEDTIME  30 tablet  6  . loperamide (IMODIUM A-D) 2 MG tablet Take 2 mg by mouth daily as needed for diarrhea or loose stools.       . metoprolol (LOPRESSOR) 50 MG tablet Take 1 tablet (50 mg total) by mouth 2 (two) times daily.  60 tablet  0  . nitroGLYCERIN (NITROSTAT) 0.4 MG SL tablet Place 0.4 mg under the tongue every 5 (five) minutes as needed for chest pain.      . Omega-3 Fatty Acids (FISH OIL) 1000 MG CAPS Take 2 capsules by mouth 2 (two) times daily.        Marland Kitchen omeprazole (PRILOSEC) 20 MG capsule Take 1 capsule (20 mg  total) by mouth daily.  90 capsule  3  . Pitavastatin Calcium 4 MG TABS Take 1 tablet (4 mg total) by mouth daily.  30 tablet  3  . vitamin B-12 (CYANOCOBALAMIN) 1000 MCG tablet Take 1,000 mcg by mouth daily.         No current facility-administered medications on file prior to visit.    Allergies  Allergen Reactions  . Celecoxib     REACTION: hives and itching    Family History  Problem Relation Age of Onset  . Alzheimer's disease Mother 28    Deceased  . Arthritis/Rheumatoid Mother   . Heart disease Father 59    Living  . Coronary artery disease Father   . Stomach cancer Maternal Uncle   . Brain cancer Maternal Aunt     x2  . Healthy Brother   . Obesity Daughter     Had Bypass Sx    History   Social History  . Marital Status: Married    Spouse Name: etta    Number of Children: N/A  . Years of Education: N/A   Occupational History  . retired     disabled due to back  problems   Social History Main Topics  . Smoking status: Former Smoker -- 1.50 packs/day for 50 years    Types: Cigarettes    Quit date: 04/06/2011  . Smokeless tobacco: Former Systems developer     Comment: smoked less than 1 ppd for 40+ years; quit 7/10  . Alcohol Use: Yes     Comment: social   . Drug Use: No  . Sexual Activity: None   Other Topics Concern  . None   Social History Narrative   Married (3rd), [place]. 2 children from 1st marriage, 4 step children.    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       Cell # [id number]   Review of Systems - See HPI.  All other ROS are negative.  BP 113/69  Pulse 62  Temp(Src) 97.8 F (36.6 C) (Oral)  Resp 16  Ht 5\' 8"  (1.727 m)  Wt 184 lb (83.462 kg)  BMI 27.98 kg/m2  SpO2 100%  Physical Exam  Vitals reviewed. Constitutional: He is oriented to person, place, and time and well-developed, well-nourished, and in no distress.  HENT:  Head: Normocephalic and atraumatic.  Eyes: Conjunctivae are normal.  Neck: Neck supple.  Cardiovascular: Normal rate, regular rhythm, normal heart sounds and intact distal pulses.   Pulmonary/Chest: Effort normal and breath sounds normal. No respiratory distress. He has no wheezes. He has no rales. He exhibits no tenderness.  Neurological: He is alert and oriented to person, place, and time.  Skin: Skin is warm and dry. No rash noted.  Psychiatric: Affect normal.   Assessment/Plan: Low back pain syndrome UDS obtained. Results pending.  CSC has already been signed.  Medication refilled.  Follow-up in 6 months.  Seasonal allergies Flonase refilled.  Encouraged patient to take Flonase and Zyrtec daily as directed.

## 2013-11-23 NOTE — Patient Instructions (Signed)
Your physician recommends that you continue on your current medications as directed. Please refer to the Current Medication list given to you today.   Your physician wants you to follow-up in:  One year with DR [facility] .You will receive a reminder letter in the mail two months in advance. If you don't receive a letter, please call our office to schedule the follow-up appointment.

## 2013-11-29 ENCOUNTER — Other Ambulatory Visit: Payer: Self-pay | Admitting: Physician Assistant

## 2013-12-28 ENCOUNTER — Telehealth: Payer: Self-pay | Admitting: Physician Assistant

## 2013-12-28 DIAGNOSIS — M5440 Lumbago with sciatica, unspecified side: Secondary | ICD-10-CM

## 2013-12-28 NOTE — Telephone Encounter (Signed)
Medication Detail      Disp Refills Start End     HYDROcodone-acetaminophen (NORCO/VICODIN) 5-325 MG per tablet 90 tablet 0 11/23/2013     Sig - Route: Take 0.5-1 tablets by mouth every 6 (six) hours as needed for moderate pain. - Oral    Class: Print     Associated Diagnoses    Low back pain with sciatica, sciatica laterality unspecified, unspecified back pain laterality       Pharmacy    [id number] - [place], [place]    Patient Instructions  11/23/2013 AVS   Continue pain medication as directed. Continue all other medications. Follow-up in 6 months.   Please Advise on refills/SLS

## 2013-12-28 NOTE — Telephone Encounter (Signed)
Ok to print refill same strength, quantity and instructions. Please place on my desk. Will sign and place up front tomorrow morning once back in morning. Please let patient know refill will be ready for pickup by 8am

## 2013-12-28 NOTE — Telephone Encounter (Signed)
Wife called in requesting refill on Norco, call back when ready for pick up

## 2013-12-29 ENCOUNTER — Telehealth: Payer: Self-pay | Admitting: Physician Assistant

## 2013-12-29 MED ORDER — HYDROCODONE-ACETAMINOPHEN 5-325 MG PO TABS: 0.5000 | ORAL_TABLET | Freq: Four times a day (QID) | ORAL | Status: DC | PRN

## 2013-12-29 MED ORDER — HYDROCODONE-ACETAMINOPHEN 5-325 MG PO TABS
0.5000 | ORAL_TABLET | Freq: Four times a day (QID) | ORAL | Status: DC | PRN
Start: 2013-12-29 — End: 2014-02-10

## 2013-12-29 NOTE — Telephone Encounter (Signed)
Caller name: Morgen Relation to pt: self Call back number: [phone] Pharmacy:  Reason for call:   Patient states that his arm became extremely painful after given the flu shot. His arm hurt for a few days, then the pain went away. He states that now the pain is back and has been hurting for the past 3 weeks. He states that his shoulder feels better when taking arithritis medication but starts hurting again after a few hours.

## 2013-12-29 NOTE — Telephone Encounter (Signed)
Called and spoke with patient and he stated that he would call to schedule an appointment after Thanksgiving. He stated his pain is tolerable at the time. No further questions/concerns. JG//CMA

## 2013-12-29 NOTE — Telephone Encounter (Signed)
Please inform pt that this is ready  Shredded first print with Dr Frederik Pear name

## 2013-12-29 NOTE — Telephone Encounter (Signed)
Informed patient of this.  °

## 2013-12-29 NOTE — Telephone Encounter (Signed)
Pain of that nature and for 3 week duration should definitely not be stemming from a flu shot.  If symptoms are worsening, he needs to be evaluated in clinic.

## 2014-01-06 ENCOUNTER — Ambulatory Visit (INDEPENDENT_AMBULATORY_CARE_PROVIDER_SITE_OTHER): Payer: Medicare Other | Admitting: Physician Assistant

## 2014-01-06 ENCOUNTER — Encounter: Payer: Self-pay | Admitting: Physician Assistant

## 2014-01-06 ENCOUNTER — Ambulatory Visit (HOSPITAL_BASED_OUTPATIENT_CLINIC_OR_DEPARTMENT_OTHER)
Admission: RE | Admit: 2014-01-06 | Discharge: 2014-01-06 | Disposition: A | Discharge status: Home / Self Care | Payer: Medicare Other | Source: Ambulatory Visit | Attending: Physician Assistant | Admitting: Physician Assistant

## 2014-01-06 VITALS — BP 138/66 | HR 65 | Temp 98.1°F | Resp 16 | Ht 70.0 in | Wt 185.4 lb

## 2014-01-06 DIAGNOSIS — M19012 Primary osteoarthritis, left shoulder: Secondary | ICD-10-CM | POA: Diagnosis not present

## 2014-01-06 DIAGNOSIS — Z720 Tobacco use: Secondary | ICD-10-CM

## 2014-01-06 DIAGNOSIS — M25512 Pain in left shoulder: Secondary | ICD-10-CM

## 2014-01-06 MED ORDER — ATORVASTATIN CALCIUM 40 MG PO TABS: 40.0000 mg | ORAL_TABLET | Freq: Every day | ORAL | Status: DC

## 2014-01-06 MED ORDER — VARENICLINE TARTRATE 0.5 MG X 11 & 1 MG X 42 PO MISC: ORAL | Status: DC

## 2014-01-06 MED ORDER — NABUMETONE 500 MG PO TABS: 500.0000 mg | ORAL_TABLET | Freq: Every day | ORAL | Status: DC

## 2014-01-06 NOTE — Patient Instructions (Signed)
Please go downstairs for x-ray.  I will call you with your results.  Avoid heavy lifting or overexertion.  Continue pain medications as directed.  Use Relafen sparingly.  Follow-up if symptoms not improving.  For smoking cessation -- begin Chantix taking as directed.  Follow-up with me in 1 month.

## 2014-01-06 NOTE — Progress Notes (Signed)
Pre visit review using our clinic review tool, if applicable. No additional management support is needed unless otherwise documented below in the visit note/SLS  

## 2014-01-10 DIAGNOSIS — Z720 Tobacco use: Secondary | ICD-10-CM | POA: Insufficient documentation

## 2014-01-10 DIAGNOSIS — F17201 Nicotine dependence, unspecified, in remission: Secondary | ICD-10-CM | POA: Insufficient documentation

## 2014-01-10 DIAGNOSIS — G8929 Other chronic pain: Secondary | ICD-10-CM | POA: Insufficient documentation

## 2014-01-10 DIAGNOSIS — M25512 Pain in left shoulder: Secondary | ICD-10-CM | POA: Insufficient documentation

## 2014-01-10 NOTE — Assessment & Plan Note (Signed)
Suspect rotator cuff tendinopathy, but giving history will obtain X-ray shoulder to assess.  Medications discussed for pain and inflammation relief.  Rx sent to pharmacy.  Avoid heavy lifting or overexertion.  Will refer to specialist if symptoms do not improve or if abnormality noted on imaging.

## 2014-01-10 NOTE — Progress Notes (Signed)
Patient presents to clinic today c/o pain in left shoulder with decreased ROM and weakness with adduction of shoulder.  Denies trauma and injury but does endorse history of significant OA of left shoulder.  Denies numbness or tingling.  Has not taken anything for symptoms.  Symptoms have been present for > 1 month and are gradually worsening.  Patient also requesting to start Chantix to aid in smoking cessation.  Patient has recently restarted smoking.  Is smoking 1/3-1/2 pack per day.  Has used Chantix previously with success.  Denies ADR with medication.  Past Medical History  Diagnosis Date  . HLD (hyperlipidemia)   . Aortic stenosis     mild echocardiogram 8/09 EF 60%, showed no regional wall motion abnml, mild LVH, mod focal basal septal hypertrophy and mild dyastolic dysfunction. 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 mean gradient across aortic valve was 8 mmHG. also mild L atrial enlargement and normal RV size and function. minimal AS on LHC in 7/10.   Marland Kitchen HTN (hypertension)   . GERD (gastroesophageal reflux disease)   . BPH (benign prostatic hypertrophy)   . Diverticulosis   . CAD (coronary artery disease)     LCH (7/10) totally occluded proximal RCA with very robuse L to R collaterals, 50% proximal LAD stenosis, EF 65%, medical management.    . Colon polyps   . Smoker   . TIA (transient ischemic attack)     hx  . Stroke     hx; involved brainstem; slurred speech   . Low back pain     s/p surgical fusion  . Osteoarthritis   . Chicken pox     Current Outpatient Prescriptions on File Prior to Visit  Medication Sig Dispense Refill  . albuterol (PROVENTIL HFA;VENTOLIN HFA) 108 (90 BASE) MCG/ACT inhaler Inhale 1-2 puffs into the lungs every 6 (six) hours as needed for wheezing or shortness of breath.    Marland Kitchen amLODipine (NORVASC) 10 MG tablet TAKE 1 TABLET (10 MG TOTAL) BY MOUTH DAILY. 30 tablet 4  . aspirin 81 MG EC tablet Take 81 mg by  mouth daily.      . benazepril (LOTENSIN) 20 MG tablet TAKE 1 TABLET (20 MG TOTAL) BY MOUTH DAILY. 30 tablet 6  . Cetirizine HCl 10 MG CAPS Take 20 mg by mouth daily.    . cholecalciferol (VITAMIN D) 1000 UNITS tablet Take 1,000 Units by mouth as directed.     . Coenzyme Q10 (CO Q-10) 200 MG CAPS Take 1 capsule by mouth daily. (Patient taking differently: Take 1 capsule by mouth as directed. )  0  . fluticasone (FLONASE) 50 MCG/ACT nasal spray Place 2 sprays into both nostrils daily. (Patient taking differently: Place 2 sprays into both nostrils daily as needed. ) 16 g 6  . HYDROcodone-acetaminophen (NORCO/VICODIN) 5-325 MG per tablet Take 0.5-1 tablets by mouth every 6 (six) hours as needed for moderate pain. 90 tablet 0  . isosorbide mononitrate (IMDUR) 30 MG 24 hr tablet TAKE 1 TABLET BY MOUTH AT BEDTIME 30 tablet 6  . loperamide (IMODIUM A-D) 2 MG tablet Take 2 mg by mouth daily as needed for diarrhea or loose stools.     . metoprolol (LOPRESSOR) 50 MG tablet TAKE 1 TABLET (50 MG TOTAL) BY MOUTH 2 (TWO) TIMES DAILY. 60 tablet 4  . nitroGLYCERIN (NITROSTAT) 0.4 MG SL tablet Place 0.4 mg under the tongue every 5 (five) minutes as needed for chest pain.    Marland Kitchen  Omega-3 Fatty Acids (FISH OIL) 1000 MG CAPS Take 2 capsules by mouth 2 (two) times daily.      Marland Kitchen omeprazole (PRILOSEC) 20 MG capsule Take 1 capsule (20 mg total) by mouth daily. 90 capsule 3  . Pitavastatin Calcium 4 MG TABS Take 1 tablet (4 mg total) by mouth daily. 30 tablet 3  . vitamin B-12 (CYANOCOBALAMIN) 1000 MCG tablet Take 1,000 mcg by mouth daily.       No current facility-administered medications on file prior to visit.    Allergies  Allergen Reactions  . Celecoxib     REACTION: hives and itching    Family History  Problem Relation Age of Onset  . Alzheimer's disease Mother 50    Deceased  . Arthritis/Rheumatoid Mother   . Heart disease Father 34    Living  . Coronary artery disease Father   . Stomach cancer Maternal  Uncle   . Brain cancer Maternal Aunt     x2  . Healthy Brother   . Obesity Daughter     Had Bypass Sx    History   Social History  . Marital Status: Married    Spouse Name: etta    Number of Children: N/A  . Years of Education: N/A   Occupational History  . retired     disabled due to back problems   Social History Main Topics  . Smoking status: Current Every Day Smoker -- 1.50 packs/day for 50 years    Types: Cigarettes    Last Attempt to Quit: 04/06/2011  . Smokeless tobacco: Former Systems developer     Comment: smoked less than 1 ppd for 40+ years  . Alcohol Use: 0.0 oz/week    0 Not specified per week     Comment: social   . Drug Use: No  . Sexual Activity: None   Other Topics Concern  . None   Social History Narrative   Married (3rd), [place]. 2 children from 1st marriage, 4 step children.    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       Cell # [id number]    Review of Systems - See HPI.  All other ROS are negative.  BP 138/66 mmHg  Pulse 65  Temp(Src) 98.1 F (36.7 C) (Oral)  Resp 16  Ht 5\' 10"  (1.778 m)  Wt 185 lb 6 oz (84.086 kg)  BMI 26.60 kg/m2  SpO2 97%  Physical Exam  Constitutional: He is oriented to person, place, and time and well-developed, well-nourished, and in no distress.  HENT:  Head: Normocephalic and atraumatic.  Cardiovascular: Normal rate, regular rhythm, normal heart sounds and intact distal pulses.   Pulmonary/Chest: Effort normal and breath sounds normal. No respiratory distress. He has no wheezes. He has no rales. He exhibits no tenderness.  Musculoskeletal:       Right shoulder: Normal.       Left shoulder: He exhibits tenderness and pain. He exhibits normal range of motion, no swelling, no spasm, normal pulse and normal strength.       Left elbow: Normal.       Cervical back: Normal.  Neurological: He is alert and oriented to person, place, and time.    Skin: Skin is warm and dry. No rash noted.  Psychiatric: Affect normal.  Vitals reviewed.  Assessment/Plan: Left shoulder pain Suspect rotator cuff tendinopathy, but giving history will obtain X-ray shoulder to assess.  Medications discussed for pain and inflammation relief.  Rx sent to pharmacy.  Avoid heavy lifting or overexertion.  Will refer to specialist if symptoms do not improve or if abnormality noted on imaging.  Tobacco abuse disorder ADRs of Chantix discussed with patient.  Will proceed with Chantix starter pack.  Follow-up in 1 month.

## 2014-01-10 NOTE — Assessment & Plan Note (Signed)
ADRs of Chantix discussed with patient.  Will proceed with Chantix starter pack.  Follow-up in 1 month.

## 2014-02-10 ENCOUNTER — Telehealth: Payer: Self-pay | Admitting: Physician Assistant

## 2014-02-10 DIAGNOSIS — M5440 Lumbago with sciatica, unspecified side: Secondary | ICD-10-CM

## 2014-02-10 MED ORDER — HYDROCODONE-ACETAMINOPHEN 5-325 MG PO TABS: 0.5000 | ORAL_TABLET | Freq: Four times a day (QID) | ORAL | Status: DC | PRN

## 2014-02-10 NOTE — Telephone Encounter (Signed)
Spoke with [name] and gave provider instructions, Sheila will make appt before his Wellness visit, and [name] will pick-up stool container for H Pylori Antigen testing and return sample to lab when p/u Rx at front desk/SLS

## 2014-02-10 NOTE — Telephone Encounter (Signed)
Medication Detail      Disp Refills Start End     HYDROcodone-acetaminophen (NORCO/VICODIN) 5-325 MG per tablet 90 tablet 0 12/29/2013     Sig - Route: Take 0.5-1 tablets by mouth every 6 (six) hours as needed for moderate pain. - Oral    Class: Print     Associated Diagnoses    Low back pain with sciatica, sciatica laterality unspecified, unspecified back pain laterality - Primary

## 2014-02-10 NOTE — Telephone Encounter (Signed)
Pt states that he also need the order put in for his labs. And pt's wife states that they both need to get their labs done at that same time if possible.

## 2014-02-10 NOTE — Telephone Encounter (Signed)
Caller name:Klinke Etta Relation to EF:EOFHQR Call back Blaine:  Reason for call: pt is needing rx HYDROcodone-acetaminophen (NORCO/VICODIN) 5-325 MG per tablet please call when available for pick up.

## 2014-02-10 NOTE — Telephone Encounter (Signed)
Medication refilled.  At front desk for pickup.  I do not see where he needs labs until 4/16. Was there anything in particular that they were wanting checked until he comes in for routine wellness?

## 2014-03-26 ENCOUNTER — Telehealth: Payer: Self-pay | Admitting: Physician Assistant

## 2014-03-26 DIAGNOSIS — M5440 Lumbago with sciatica, unspecified side: Secondary | ICD-10-CM

## 2014-03-26 MED ORDER — HYDROCODONE-ACETAMINOPHEN 5-325 MG PO TABS: 0.5000 | ORAL_TABLET | Freq: Four times a day (QID) | ORAL | Status: DC | PRN

## 2014-03-26 NOTE — Telephone Encounter (Signed)
Caller name: jaren Relation to pt: self Call back number: [phone] Pharmacy:  Reason for call:   Requesting hydrocodone refill

## 2014-03-26 NOTE — Telephone Encounter (Signed)
Refill granted.  Rx printed, signed and at front desk for pickup.

## 2014-03-26 NOTE — Telephone Encounter (Signed)
Patient's spouse, [name], informed, understood & agreed/SLS

## 2014-03-26 NOTE — Telephone Encounter (Addendum)
Medication Detail      Disp Refills Start End     HYDROcodone-acetaminophen (NORCO/VICODIN) 5-325 MG per tablet 90 tablet 0 02/10/2014     Sig - Route: Take 0.5-1 tablets by mouth every 6 (six) hours as needed for moderate pain. - Oral    Class: Print   Associated Diagnoses    Low back pain with sciatica, sciatica laterality unspecified, unspecified back pain laterality - Primary     Pharmacy    [id number] - [place], [place]   LAST OV: 12.02.16 ROV: 1-Mth. Please Advise on refills/SLS

## 2014-03-30 ENCOUNTER — Encounter: Payer: Self-pay | Admitting: Gastroenterology

## 2014-04-19 ENCOUNTER — Other Ambulatory Visit: Payer: Self-pay | Admitting: Cardiology

## 2014-05-07 ENCOUNTER — Other Ambulatory Visit: Payer: Self-pay | Admitting: Physician Assistant

## 2014-05-07 DIAGNOSIS — M5440 Lumbago with sciatica, unspecified side: Secondary | ICD-10-CM

## 2014-05-07 MED ORDER — HYDROCODONE-ACETAMINOPHEN 5-325 MG PO TABS: 0.5000 | ORAL_TABLET | Freq: Four times a day (QID) | ORAL | Status: DC | PRN

## 2014-05-07 NOTE — Telephone Encounter (Signed)
Provider informed of patient information [last Rx , CSC & UDS]; signed and patient informed & understood RX available during regular business hours/SLS

## 2014-05-07 NOTE — Telephone Encounter (Signed)
Caller name:Fulwider Bela Relation to ZT:AEWY Call back [phone] Pharmacy:  Reason for call: pt is needing rx HYDROcodone-acetaminophen (NORCO/VICODIN) 5-325 MG per tablet please call when ready and available for pick up.

## 2014-05-07 NOTE — Telephone Encounter (Signed)
Pt's last OV 01/06/14 and has f/u 05/25/14. Last rx 03/26/14, #90. [place] on file 11/12/13. Rx printed and forwarded to PRovider for signature.

## 2014-05-07 NOTE — Telephone Encounter (Signed)
Rx request to pharmacy/SLS  

## 2014-05-21 ENCOUNTER — Other Ambulatory Visit: Payer: Self-pay | Admitting: Physician Assistant

## 2014-05-21 NOTE — Telephone Encounter (Signed)
Medication Detail      Disp Refills Start End     metoprolol (LOPRESSOR) 50 MG tablet 60 tablet 4 11/30/2013     Sig: TAKE 1 TABLET (50 MG TOTAL) BY MOUTH 2 (TWO) TIMES DAILY.    E-Prescribing Status: Receipt confirmed by pharmacy (11/30/2013 9:00 AM EDT)     Pharmacy    [id number] - [place], [place]   Rx request to pharmacy/SLS

## 2014-05-24 ENCOUNTER — Other Ambulatory Visit: Payer: Self-pay | Admitting: *Deleted

## 2014-05-24 MED ORDER — ISOSORBIDE MONONITRATE ER 30 MG PO TB24: 30.0000 mg | ORAL_TABLET | Freq: Every day | ORAL | Status: DC

## 2014-05-24 MED ORDER — BENAZEPRIL HCL 20 MG PO TABS: 20.0000 mg | ORAL_TABLET | Freq: Every day | ORAL | Status: DC

## 2014-05-24 MED ORDER — METOPROLOL TARTRATE 50 MG PO TABS: 50.0000 mg | ORAL_TABLET | Freq: Two times a day (BID) | ORAL | Status: DC

## 2014-05-24 MED ORDER — AMLODIPINE BESYLATE 10 MG PO TABS: 10.0000 mg | ORAL_TABLET | Freq: Every day | ORAL | Status: DC

## 2014-05-24 NOTE — Telephone Encounter (Signed)
Pharmacy request for 90-day supply; Rx to pharmacy/SLS

## 2014-05-25 ENCOUNTER — Ambulatory Visit (INDEPENDENT_AMBULATORY_CARE_PROVIDER_SITE_OTHER): Payer: Medicare Other | Admitting: Physician Assistant

## 2014-05-25 ENCOUNTER — Ambulatory Visit: Payer: Self-pay | Admitting: Physician Assistant

## 2014-05-25 ENCOUNTER — Ambulatory Visit: Payer: Medicare Other | Admitting: Physician Assistant

## 2014-05-25 ENCOUNTER — Encounter: Payer: Self-pay | Admitting: Physician Assistant

## 2014-05-25 VITALS — BP 138/72 | HR 68 | Temp 97.9°F | Resp 16 | Ht 70.0 in | Wt 183.1 lb

## 2014-05-25 DIAGNOSIS — M150 Primary generalized (osteo)arthritis: Secondary | ICD-10-CM | POA: Diagnosis not present

## 2014-05-25 DIAGNOSIS — M8949 Other hypertrophic osteoarthropathy, multiple sites: Secondary | ICD-10-CM

## 2014-05-25 DIAGNOSIS — I10 Essential (primary) hypertension: Secondary | ICD-10-CM | POA: Diagnosis not present

## 2014-05-25 DIAGNOSIS — M5440 Lumbago with sciatica, unspecified side: Secondary | ICD-10-CM

## 2014-05-25 DIAGNOSIS — M159 Polyosteoarthritis, unspecified: Secondary | ICD-10-CM

## 2014-05-25 DIAGNOSIS — E785 Hyperlipidemia, unspecified: Secondary | ICD-10-CM

## 2014-05-25 LAB — HEPATIC FUNCTION PANEL
ALBUMIN: 4.3 g/dL (ref 3.5–5.2)
ALT: 20 U/L (ref 0–53)
AST: 18 U/L (ref 0–37)
Alkaline Phosphatase: 112 U/L (ref 39–117)
Bilirubin, Direct: 0.2 mg/dL (ref 0.0–0.3)
Total Bilirubin: 0.7 mg/dL (ref 0.2–1.2)
Total Protein: 7.2 g/dL (ref 6.0–8.3)

## 2014-05-25 LAB — LIPID PANEL
CHOL/HDL RATIO: 4
Cholesterol: 124 mg/dL (ref 0–200)
HDL: 31.7 mg/dL — ABNORMAL LOW (ref >39.00)
LDL CALC: 69 mg/dL (ref 0–99)
NONHDL: 92.3
Triglycerides: 116 mg/dL (ref 0.0–149.0)
VLDL: 23.2 mg/dL (ref 0.0–40.0)

## 2014-05-25 MED ORDER — NITROGLYCERIN 0.4 MG SL SUBL: 0.4000 mg | SUBLINGUAL_TABLET | SUBLINGUAL | Status: DC | PRN

## 2014-05-25 MED ORDER — ATORVASTATIN CALCIUM 40 MG PO TABS: 40.0000 mg | ORAL_TABLET | Freq: Every day | ORAL | Status: DC

## 2014-05-25 MED ORDER — HYDROCODONE-ACETAMINOPHEN 5-325 MG PO TABS: 0.5000 | ORAL_TABLET | Freq: Four times a day (QID) | ORAL | Status: DC | PRN

## 2014-05-25 NOTE — Patient Instructions (Signed)
Please continue medications as directed.  Stop by lab for blood work. I will call you with your results.  High Cholesterol High cholesterol refers to having a high level of cholesterol in your blood. Cholesterol is a white, waxy, fat-like protein that your body needs in small amounts. Your liver makes all the cholesterol you need. Excess cholesterol comes from the food you eat. Cholesterol travels in your bloodstream through your blood vessels. If you have high cholesterol, deposits (plaque) may build up on the walls of your blood vessels. This makes the arteries narrower and stiffer. Plaque increases your risk of heart attack and stroke. Work with your health care provider to keep your cholesterol levels in a healthy range. RISK FACTORS Several things can make you more likely to have high cholesterol. These include:   Eating foods high in animal fat (saturated fat) or cholesterol.  Being overweight.  Not getting enough exercise.  Having a family history of high cholesterol. SIGNS AND SYMPTOMS High cholesterol does not cause symptoms. DIAGNOSIS  Your health care provider can do a blood test to check whether you have high cholesterol. If you are older than 20, your health care provider may check your cholesterol every 4-6 years. You may be checked more often if you already have high cholesterol or other risk factors for heart disease. The blood test for cholesterol measures the following:  Bad cholesterol (LDL cholesterol). This is the type of cholesterol that causes heart disease. This number should be less than 100.  Good cholesterol (HDL cholesterol). This type helps protect against heart disease. A healthy level of HDL cholesterol is 60 or higher.  Total cholesterol. This is the combined number of LDL cholesterol and HDL cholesterol. A healthy number is less than 200. TREATMENT  High cholesterol can be treated with diet changes, lifestyle changes, and medicine.   Diet changes may  include eating more whole grains, fruits, vegetables, nuts, and fish. You may also have to cut back on red meat and foods with a lot of added sugar.  Lifestyle changes may include getting at least 40 minutes of aerobic exercise three times a week. Aerobic exercises include walking, biking, and swimming. Aerobic exercise along with a healthy diet can help you maintain a healthy weight. Lifestyle changes may also include quitting smoking.  If diet and lifestyle changes are not enough to lower your cholesterol, your health care provider may prescribe a statin medicine. This medicine has been shown to lower cholesterol and also lower the risk of heart disease. HOME CARE INSTRUCTIONS  Only take over-the-counter or prescription medicines as directed by your health care provider.   Follow a healthy diet as directed by your health care provider. For instance:   Eat chicken (without skin), fish, veal, shellfish, ground [place] breast, and round or loin cuts of red meat.  Do not eat fried foods and fatty meats, such as hot dogs and salami.   Eat plenty of fruits, such as apples.   Eat plenty of vegetables, such as broccoli, potatoes, and carrots.   Eat beans, peas, and lentils.   Eat grains, such as barley, rice, couscous, and bulgur wheat.   Eat pasta without cream sauces.   Use skim or nonfat milk and low-fat or nonfat yogurt and cheeses. Do not eat or drink whole milk, cream, ice cream, egg yolks, and hard cheeses.   Do not eat stick margarine or tub margarines that contain trans fats (also called partially hydrogenated oils).   Do not eat cakes,  cookies, crackers, or other baked goods that contain trans fats.   Do not eat saturated tropical oils, such as coconut and palm oil.   Exercise as directed by your health care provider. Increase your activity level with activities such as gardening or walking.   Keep all follow-up appointments.  SEEK MEDICAL CARE IF:  You are  struggling to maintain a healthy diet or weight.  You need help starting an exercise program.  You need help to stop smoking. SEEK IMMEDIATE MEDICAL CARE IF:  You have chest pain.  You have trouble breathing. Document Released: 01/22/2005 Document Revised: 06/08/2013 Document Reviewed: 11/14/2012 [facility] Patient Information 2015 [place], [place]. This information is not intended to replace advice given to you by your health care provider. Make sure you discuss any questions you have with your health care provider.

## 2014-05-25 NOTE — Assessment & Plan Note (Signed)
Well overall. We'll continue current medication regimen. Encouraged patient to start tumeric supplement daily to help with inflammation.

## 2014-05-25 NOTE — Progress Notes (Signed)
Patient presents to clinic today for follow-up of chronic medical conditions.  Hypertension -- patient endorses taking medications daily as directed. Patient denies chest pain, palpitations, lightheadedness, dizziness, vision changes or frequent headaches.   Hyperlipidemia -- patient endorses taking Lipitor daily as directed without myalgias is fasting for labs.  Osteoarthritis -- patient taking hydrocodone once daily. Is no longer taking Relafen for every 10 supplement. Patient states symptoms doing well overall, but still having some mild pain especially in knees.   Past Medical History  Diagnosis Date  . HLD (hyperlipidemia)   . Aortic stenosis     mild echocardiogram 8/09 EF 60%, showed no regional wall motion abnml, mild LVH, mod focal basal septal hypertrophy and mild dyastolic dysfunction. 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 mean gradient across aortic valve was 8 mmHG. also mild L atrial enlargement and normal RV size and function. minimal AS on LHC in 7/10.   Marland Kitchen HTN (hypertension)   . GERD (gastroesophageal reflux disease)   . BPH (benign prostatic hypertrophy)   . Diverticulosis   . CAD (coronary artery disease)     LCH (7/10) totally occluded proximal RCA with very robuse L to R collaterals, 50% proximal LAD stenosis, EF 65%, medical management.    . Colon polyps   . Smoker   . TIA (transient ischemic attack)     hx  . Stroke     hx; involved brainstem; slurred speech   . Low back pain     s/p surgical fusion  . Osteoarthritis   . Chicken pox     Current Outpatient Prescriptions on File Prior to Visit  Medication Sig Dispense Refill  . albuterol (PROVENTIL HFA;VENTOLIN HFA) 108 (90 BASE) MCG/ACT inhaler Inhale 1-2 puffs into the lungs every 6 (six) hours as needed for wheezing or shortness of breath.    Marland Kitchen amLODipine (NORVASC) 10 MG tablet Take 1 tablet (10 mg total) by mouth daily. 90 tablet 1  . aspirin 81 MG EC  tablet Take 81 mg by mouth daily.      . benazepril (LOTENSIN) 20 MG tablet Take 1 tablet (20 mg total) by mouth daily. 90 tablet 1  . Cetirizine HCl 10 MG CAPS Take 10 mg by mouth daily as needed.     . cholecalciferol (VITAMIN D) 1000 UNITS tablet Take 1,000 Units by mouth as directed.     . fluticasone (FLONASE) 50 MCG/ACT nasal spray Place 2 sprays into both nostrils daily. (Patient taking differently: Place 2 sprays into both nostrils daily as needed. ) 16 g 6  . isosorbide mononitrate (IMDUR) 30 MG 24 hr tablet Take 1 tablet (30 mg total) by mouth at bedtime. 90 tablet 1  . loperamide (IMODIUM A-D) 2 MG tablet Take 2 mg by mouth daily as needed for diarrhea or loose stools.     . metoprolol (LOPRESSOR) 50 MG tablet Take 1 tablet (50 mg total) by mouth 2 (two) times daily. 180 tablet 1  . nabumetone (RELAFEN) 500 MG tablet Take 1 tablet (500 mg total) by mouth daily. (Patient taking differently: Take 500 mg by mouth daily as needed. ) 30 tablet 0  . Omega-3 Fatty Acids (FISH OIL) 1000 MG CAPS Take 2 capsules by mouth 2 (two) times daily.      Marland Kitchen omeprazole (PRILOSEC) 20 MG capsule Take 1 capsule (20 mg total) by mouth daily. (Patient taking differently: Take 20 mg by mouth daily as needed. ) 90 capsule  3  . vitamin B-12 (CYANOCOBALAMIN) 1000 MCG tablet Take 1,000 mcg by mouth daily.       No current facility-administered medications on file prior to visit.    Allergies  Allergen Reactions  . Celecoxib     REACTION: hives and itching    Family History  Problem Relation Age of Onset  . Alzheimer's disease Mother 53    Deceased  . Arthritis/Rheumatoid Mother   . Heart disease Father 29    Living  . Coronary artery disease Father   . Stomach cancer Maternal Uncle   . Brain cancer Maternal Aunt     x2  . Healthy Brother   . Obesity Daughter     Had Bypass Sx    History   Social History  . Marital Status: Married    Spouse Name: etta  . Number of Children: N/A  . Years of  Education: N/A   Occupational History  . retired     disabled due to back problems   Social History Main Topics  . Smoking status: Current Every Day Smoker -- 1.50 packs/day for 50 years    Types: Cigarettes    Last Attempt to Quit: 04/06/2011  . Smokeless tobacco: Former Systems developer     Comment: smoked less than 1 ppd for 40+ years; Using Administrator, sports  . Alcohol Use: 0.0 oz/week    0 Standard drinks or equivalent per week     Comment: social   . Drug Use: No  . Sexual Activity: Not on file   Other Topics Concern  . None   Social History Narrative   Married (3rd), [place]. 2 children from 1st marriage, 4 step children.    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       Cell # [id number]   Review of Systems - See HPI.  All other ROS are negative.  BP 138/72 mmHg  Pulse 68  Temp(Src) 97.9 F (36.6 C) (Oral)  Resp 16  Ht 5\' 10"  (1.778 m)  Wt 183 lb 2 oz (83.065 kg)  BMI 26.28 kg/m2  SpO2 97%  Physical Exam  Constitutional: He is oriented to person, place, and time and well-developed, well-nourished, and in no distress.  HENT:  Head: Normocephalic and atraumatic.  Eyes: Conjunctivae are normal.  Neck: Neck supple.  Cardiovascular: Normal rate, regular rhythm, normal heart sounds and intact distal pulses.   Pulmonary/Chest: Effort normal and breath sounds normal. No respiratory distress. He has no wheezes. He has no rales. He exhibits no tenderness.  Musculoskeletal: Normal range of motion. He exhibits no edema or tenderness.  Lymphadenopathy:    He has no cervical adenopathy.  Neurological: He is alert and oriented to person, place, and time.  Skin: Skin is warm and dry. No rash noted.  Psychiatric: Affect normal.  Vitals reviewed.  Assessment/Plan: Hyperlipidemia We'll obtain fasting lipid panel and liver function testing today. For now continue current regimen. Dietary and exercise measures  discussed.   Hypertension Stable. We'll continue current medication regimen. Continue ASA daily.   Osteoarthritis Well overall. We'll continue current medication regimen. Encouraged patient to start tumeric supplement daily to help with inflammation.

## 2014-05-25 NOTE — Progress Notes (Signed)
Pre visit review using our clinic review tool, if applicable. No additional management support is needed unless otherwise documented below in the visit note/SLS  

## 2014-05-25 NOTE — Assessment & Plan Note (Signed)
We'll obtain fasting lipid panel and liver function testing today. For now continue current regimen. Dietary and exercise measures discussed.

## 2014-05-25 NOTE — Assessment & Plan Note (Signed)
Stable. We'll continue current medication regimen. Continue ASA daily.

## 2014-06-16 ENCOUNTER — Telehealth: Payer: Self-pay | Admitting: Cardiology

## 2014-06-16 NOTE — Telephone Encounter (Signed)
lmtcb 5/11 @ 3:10pm. Need to inform patient that Dr. Aundra Dubin can see him on 5/19 at 2:00 pm.

## 2014-06-16 NOTE — Telephone Encounter (Signed)
Fit him in on 5/19 or 5/31.  I can start at 12:45 on 5/31. Let me know if that does not work.

## 2014-06-16 NOTE — Telephone Encounter (Signed)
Patient c/o Palpitations:  High priority if patient c/o lightheadedness and shortness of breath.  1. How long have you been having palpitations? Since this morning  2. Are you currently experiencing lightheadedness and shortness of breath? Lightheaded/Dizzy  3. Have you checked your BP and heart rate? No  4. Are you experiencing any other symptoms? Dizzy spells x 3 days  Pt's wife calling stating pt is having dizzy spells and palpitations and needs to be seen. Please call back and advise.

## 2014-06-16 NOTE — Telephone Encounter (Signed)
Notified patient of scheduled appointment date and time.

## 2014-06-16 NOTE — Telephone Encounter (Signed)
Patient's wife called (dpr on file) to report that her husband, the patient, has began to experience light-headedness/dizziness - mild to moderate - over the past three days. Does not feel like he is going to pass out or fall, however he did have an event of palpitations today that lasted for a few minutes only. He is tired and fatigued. Would like to be seen by Dr. Aundra Dubin, as he saw the PA last time.  Wife says patient is working too much.  Will route to Dr. Aundra Dubin to see if there are any open appts available for this patient within the next few weeks.

## 2014-06-17 ENCOUNTER — Ambulatory Visit: Payer: Medicare Other | Admitting: Physician Assistant

## 2014-06-21 ENCOUNTER — Other Ambulatory Visit: Payer: Self-pay | Admitting: Physician Assistant

## 2014-06-24 ENCOUNTER — Ambulatory Visit (INDEPENDENT_AMBULATORY_CARE_PROVIDER_SITE_OTHER): Payer: Medicare Other | Admitting: Cardiology

## 2014-06-24 ENCOUNTER — Encounter: Payer: Self-pay | Admitting: Cardiology

## 2014-06-24 ENCOUNTER — Encounter: Payer: Self-pay | Admitting: *Deleted

## 2014-06-24 VITALS — BP 126/64 | HR 90 | Ht 70.0 in | Wt 182.0 lb

## 2014-06-24 DIAGNOSIS — I350 Nonrheumatic aortic (valve) stenosis: Secondary | ICD-10-CM

## 2014-06-24 DIAGNOSIS — I2510 Atherosclerotic heart disease of native coronary artery without angina pectoris: Secondary | ICD-10-CM

## 2014-06-24 DIAGNOSIS — E785 Hyperlipidemia, unspecified: Secondary | ICD-10-CM | POA: Diagnosis not present

## 2014-06-24 MED ORDER — AMLODIPINE BESYLATE 5 MG PO TABS: 5.0000 mg | ORAL_TABLET | Freq: Every day | ORAL | Status: DC

## 2014-06-24 NOTE — Patient Instructions (Signed)
Medication Instructions:  Decrease amlodipine to 5mg  daily.  Labwork: None today  Testing/Procedures: Your physician has requested that you have an echocardiogram. Echocardiography is a painless test that uses sound waves to create images of your heart. It provides your doctor with information about the size and shape of your heart and how well your heart's chambers and valves are working. This procedure takes approximately one hour. There are no restrictions for this procedure.    Follow-Up: Your physician wants you to follow-up in: 6 months with Dr Aundra Dubin. (November 2016). You will receive a reminder letter in the mail two months in advance. If you don't receive a letter, please call our office to schedule the follow-up appointment.   Any Other Special Instructions Will Be Listed Below (If Applicable).  Take and record your blood pressure daily. I will call you in 2-3 weeks and see how it has been.

## 2014-06-26 NOTE — Progress Notes (Signed)
Patient ID: Brandon [name], male   DOB: 01-19-1941, 74 y.o.   MRN: [id number] PCP: Elyn Aquas  74 yo with history of CAD, HTN, and hyperlipidemia presents for followup.  Patient had described some exertional chest pain and myoview showed a partially reversible inferior defect, so LHC was done in 7/10.  This showed that his proximal RCA was totally occluded.  There were very robust left to right collaterals.  There was also a 50% proximal LAD stenosis.  The patient was managed medically.  Since that time, he has been doing well symptomatically.  He has had no further exertional chest pain.  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  He has been tolerating atorvastatin reasonably well with no muscle pain (better than Crestor).   A few weeks ago he developed orthostatic symptoms and stopped his BP meds for a few days, but he is back on everything now with no lightheadedness.    Labs (12/10): HDL 28, LDL 70, K 4.3, creatinine 0.8 Labs (4/11): K 4, creatinine 0.8, LDL 53, HDL 26, LFTs normal Labs (12/11): LDL 74, HDL 27, K 4, creatinine 0.9, LFTs normal, TSH normal Labs (7/12): LDL 87, HDL 33 Labs (9/12): LDL 66, HDL 33, LFTs normal Labs (9/13): LDL 62, HDL 30, K 4.1, creatinine 0.8 Labs (4/14): LDL 88, HDL 27, K 4.4, creatinine 0.8 Labs (4/15): K 4.4, creatinine 0.76 Labs (4/16): LDL 69, HDL 32  ECG: NSR, inferior Qs  Allergies (verified):  1)  ! Celebrex  Past Medical History: 1. Hyperlipidemia 2. Aortic stenosis.  Echocardiogram in August 2009 showed EF 60%, no regional wall motion abnormalities, mild LVH, moderate focal basal septal hypertrophy, and mild diastolic dysfunction.  There was a very mild aortic stenosis with partially fused left and right coronary cusps and some restricted motion of the aortic valve.  The mean gradient, however, across the aortic valve was only 8 mmHg.  There was also  mild left atrial enlargement and normal RV size and function.  Minimal AS on LHC in 7/10.  Echo (7/14) with EF 60-65%, mild LVH, mild AS mean gradient 13 mmHg.  3. Hypertension. 4. Gastroesophageal reflux disease. 5. BPH. 6. Diverticulosis. 7. Smoked until 7/10. 8. CAD: LHC (7/10) showed totally occluded proximal RCA with very robust left to right collaterals, 50% proximal LAD stenosis, EF 65%.  Medical management.   9. Osteoarthritis 10. Colonic polyps 11. h/o TIA 12. 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  This manifested with slurred speech. 13. History of low back pain.  The patient is status post surgical back fusion. 14. History of cholecystectomy. 15. Cataract surgery 16. Prostate cancer: treated  Family History: Father alive age 77 w/ CAD & CABG at [facility] in 05/30/07 Mother died age 32 w/ Alzheimer's disease, hx of DJD 1 Sibling- Brother alive & well w/ DJD in his hips  Social History: Married- 58rd marriage, wife= Etta 2 children from 1st marriage, 4 step children Former smoker, 40+yrs of >1ppd, quit 7/10.  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    Current Outpatient Prescriptions  Medication Sig Dispense Refill  . albuterol (PROVENTIL HFA;VENTOLIN HFA) 108 (90 BASE) MCG/ACT inhaler Inhale 1-2 puffs into the lungs every 6 (six) hours as needed for wheezing or shortness of breath.    Marland Kitchen  aspirin 81 MG EC tablet Take 81 mg by mouth daily.      Marland Kitchen atorvastatin (LIPITOR) 40 MG tablet Take 1 tablet (40 mg total) by mouth daily. 90 tablet 1  . benazepril (LOTENSIN) 20 MG tablet Take 1 tablet (20 mg total) by mouth daily. 90 tablet 1  . Cetirizine HCl 10 MG CAPS Take 10 mg by mouth daily as needed.     . cholecalciferol (VITAMIN D) 1000 UNITS tablet Take 1,000 Units by mouth as directed.     . fluticasone (FLONASE) 50  MCG/ACT nasal spray Place 2 sprays into both nostrils daily. (Patient taking differently: Place 2 sprays into both nostrils daily as needed. ) 16 g 6  . HYDROcodone-acetaminophen (NORCO/VICODIN) 5-325 MG per tablet Take 0.5-1 tablets by mouth every 6 (six) hours as needed for moderate pain. 90 tablet 0  . isosorbide mononitrate (IMDUR) 30 MG 24 hr tablet TAKE 1 TABLET BY MOUTH AT BEDTIME 30 tablet 6  . loperamide (IMODIUM A-D) 2 MG tablet Take 2 mg by mouth daily as needed for diarrhea or loose stools.     . metoprolol (LOPRESSOR) 50 MG tablet Take 1 tablet (50 mg total) by mouth 2 (two) times daily. 180 tablet 1  . nabumetone (RELAFEN) 500 MG tablet Take 1 tablet (500 mg total) by mouth daily. (Patient taking differently: Take 500 mg by mouth daily as needed. ) 30 tablet 0  . nitroGLYCERIN (NITROSTAT) 0.4 MG SL tablet Place 1 tablet (0.4 mg total) under the tongue every 5 (five) minutes as needed for chest pain. 30 tablet 0  . Omega-3 Fatty Acids (FISH OIL) 1000 MG CAPS Take 2 capsules by mouth 2 (two) times daily.      Marland Kitchen omeprazole (PRILOSEC) 20 MG capsule Take 1 capsule (20 mg total) by mouth daily. (Patient taking differently: Take 20 mg by mouth daily as needed. ) 90 capsule 3  . vitamin B-12 (CYANOCOBALAMIN) 1000 MCG tablet Take 1,000 mcg by mouth daily.      Marland Kitchen amLODipine (NORVASC) 5 MG tablet Take 1 tablet (5 mg total) by mouth daily. 90 tablet 2   No current facility-administered medications for this visit.    BP 126/64 mmHg  Pulse 90  Ht 5\' 10"  (1.778 m)  Wt 182 lb (82.555 kg)  BMI 26.11 kg/m2 General:  Well developed, well nourished, in no acute distress. Neck:  Neck supple, no JVD. No masses, thyromegaly or abnormal cervical nodes. Lungs:  Clear bilaterally to auscultation and percussion. Heart:  Non-displaced PMI, chest non-tender; regular rate and rhythm, S1, S2 without  rubs or gallops.  3/6 systolic crescendo-decrescendo early peaking murmur at RUSB.  Carotid upstroke normal, no  bruit.  Pedals normal pulses. No edema, no varicosities. Abdomen:  Bowel sounds positive; abdomen soft and non-tender without masses, organomegaly, or hernias noted. No hepatosplenomegaly. Extremities:  No clubbing or cyanosis. Neurologic:  Alert and oriented x 3. Psych:  Normal affect.  Assessment/Plan:  AORTIC STENOSIS  Mild aortic stenosis by echo in 7/14.  Would repeat echo probably next year as long as no new symptoms develop.  CAD, NATIVE VESSEL  Stable with no ischemic symptoms. Continue ASA, statin, beta blocker, ACEI.  HYPERLIPIDEMIA  Good LDL in 4/16.  HYPERTENSION BP is under good control.   Loralie Champagne 06/26/2014 2:37 PM

## 2014-07-07 ENCOUNTER — Telehealth: Payer: Self-pay | Admitting: *Deleted

## 2014-07-07 NOTE — Telephone Encounter (Signed)
Copied from Dr Claris Gladden 06/24/14 office note:   A few weeks ago he developed orthostatic symptoms and stopped his BP meds for a few days, but he is back on everything now with no lightheadedness.   LMTCB -- follow up on BP-amlodipine decreased to 5mg  daily 06/24/14.

## 2014-07-07 NOTE — Telephone Encounter (Signed)
LMTCB

## 2014-07-08 ENCOUNTER — Ambulatory Visit (HOSPITAL_COMMUNITY): Payer: Medicare Other | Attending: Cardiology

## 2014-07-08 ENCOUNTER — Other Ambulatory Visit: Payer: Self-pay

## 2014-07-08 DIAGNOSIS — I350 Nonrheumatic aortic (valve) stenosis: Secondary | ICD-10-CM | POA: Diagnosis not present

## 2014-07-08 DIAGNOSIS — E785 Hyperlipidemia, unspecified: Secondary | ICD-10-CM | POA: Diagnosis not present

## 2014-07-08 DIAGNOSIS — I2510 Atherosclerotic heart disease of native coronary artery without angina pectoris: Secondary | ICD-10-CM | POA: Insufficient documentation

## 2014-07-08 DIAGNOSIS — I10 Essential (primary) hypertension: Secondary | ICD-10-CM | POA: Diagnosis not present

## 2014-07-09 ENCOUNTER — Telehealth: Payer: Self-pay | Admitting: Cardiology

## 2014-07-09 ENCOUNTER — Encounter: Payer: Self-pay | Admitting: Gastroenterology

## 2014-07-09 NOTE — Telephone Encounter (Signed)
Walk in pt form-BP message left for Webb Silversmith- gave to [employer]

## 2014-07-23 ENCOUNTER — Telehealth: Payer: Self-pay | Admitting: Physician Assistant

## 2014-07-23 DIAGNOSIS — M5440 Lumbago with sciatica, unspecified side: Secondary | ICD-10-CM

## 2014-07-23 MED ORDER — HYDROCODONE-ACETAMINOPHEN 5-325 MG PO TABS: 0.5000 | ORAL_TABLET | Freq: Four times a day (QID) | ORAL | Status: DC | PRN

## 2014-07-23 NOTE — Telephone Encounter (Signed)
Relation to pt: self   Call back number: [phone]   Reason for call:  Pt requesting a refill HYDROcodone-acetaminophen (NORCO/VICODIN) 5-325 MG per tablet

## 2014-07-23 NOTE — Telephone Encounter (Signed)
Pt said thank you very much

## 2014-07-23 NOTE — Telephone Encounter (Signed)
Refill granted. Can be picked up at front desk.

## 2014-07-29 NOTE — Telephone Encounter (Signed)
Pt states that he is on amlodipine 5mg  daily now, he is tolerating this dose.  Pt states that he is asymptomatic and his BP has been 126/72 and 128/68.

## 2014-09-09 ENCOUNTER — Telehealth: Payer: Self-pay | Admitting: Physician Assistant

## 2014-09-09 DIAGNOSIS — M5440 Lumbago with sciatica, unspecified side: Secondary | ICD-10-CM

## 2014-09-09 NOTE — Telephone Encounter (Signed)
Message routed to PCP.

## 2014-09-09 NOTE — Telephone Encounter (Signed)
Pt needing refill on hydrocodone. He has 6-8 left. Taking 1/day most days per pt wife. Please call [phone] when ready for pick up. Advised to call with 3-5 days of meds on hand in the future.

## 2014-09-09 NOTE — Telephone Encounter (Signed)
Last filled: 07/23/14 Amt: 90,0 Last OV:  05/25/14  Contract on file UDS:  LOW risk  Please advise.

## 2014-09-09 NOTE — Telephone Encounter (Signed)
Based on my count, pt has enough until PCP returns.  Will defer to him.

## 2014-09-09 NOTE — Telephone Encounter (Signed)
If he is only taking one per day then he should be fine until I return. However if he has been having days where he has taken more than once daily (it is prescribed every 6 hours as needed) then he would be overdue for refill. I will not be back in to sign Rx until Monday. You can see who if covering provider is willing to write him enough to last through weekend if he cannot wait until I return.

## 2014-09-10 NOTE — Telephone Encounter (Signed)
All set til next week. Please print so he can sign when he gets back on 8/8.

## 2014-09-10 NOTE — Telephone Encounter (Signed)
Left a message with wife for call back.

## 2014-09-10 NOTE — Telephone Encounter (Signed)
Pt states he was calling ahead.  He does not need script before next week.

## 2014-09-13 MED ORDER — HYDROCODONE-ACETAMINOPHEN 5-325 MG PO TABS: 0.5000 | ORAL_TABLET | Freq: Four times a day (QID) | ORAL | Status: DC | PRN

## 2014-09-13 NOTE — Telephone Encounter (Signed)
Please print and have Cody sign.

## 2014-09-13 NOTE — Telephone Encounter (Signed)
Rx printed signed and placed up front for the pt to pick up.  Pt aware.//AB/CMA

## 2014-10-25 ENCOUNTER — Telehealth: Payer: Self-pay | Admitting: Physician Assistant

## 2014-10-25 DIAGNOSIS — M5440 Lumbago with sciatica, unspecified side: Secondary | ICD-10-CM

## 2014-10-25 MED ORDER — HYDROCODONE-ACETAMINOPHEN 5-325 MG PO TABS: 0.5000 | ORAL_TABLET | Freq: Four times a day (QID) | ORAL | Status: DC | PRN

## 2014-10-25 NOTE — Telephone Encounter (Signed)
Refill granted. Rx printed and at front desk for pick up.

## 2014-10-25 NOTE — Telephone Encounter (Signed)
Caller name: [name] Relationship to patient: wife Can be reached: [phone]  Reason for call: Pt needing refill on hydrocodone. Has about 8-10 days but going out of town this weekend for 10-14 days. Please have ready in the next day or two. Call when ready. Wife declined scheduling f/u appt for Oct and said she'll have pt call.

## 2014-10-25 NOTE — Telephone Encounter (Signed)
Notified pt wife.

## 2014-11-30 ENCOUNTER — Telehealth: Payer: Self-pay

## 2014-11-30 NOTE — Telephone Encounter (Signed)
Called to schedule Medicare Wellness Visit with Health Coach.  Left a message for call back.  

## 2014-12-06 ENCOUNTER — Encounter: Payer: Self-pay | Admitting: Behavioral Health

## 2014-12-06 ENCOUNTER — Telehealth: Payer: Self-pay | Admitting: Behavioral Health

## 2014-12-06 NOTE — Telephone Encounter (Signed)
Pre-Visit Call completed with patient and chart updated.   Pre-Visit Info documented in Specialty Comments under [employer].    

## 2014-12-07 ENCOUNTER — Ambulatory Visit (INDEPENDENT_AMBULATORY_CARE_PROVIDER_SITE_OTHER): Payer: Medicare Other | Admitting: Physician Assistant

## 2014-12-07 ENCOUNTER — Encounter: Payer: Self-pay | Admitting: Physician Assistant

## 2014-12-07 VITALS — BP 154/73 | HR 63 | Temp 97.9°F | Resp 16 | Ht 70.0 in | Wt 183.4 lb

## 2014-12-07 DIAGNOSIS — I350 Nonrheumatic aortic (valve) stenosis: Secondary | ICD-10-CM

## 2014-12-07 DIAGNOSIS — I359 Nonrheumatic aortic valve disorder, unspecified: Secondary | ICD-10-CM

## 2014-12-07 DIAGNOSIS — R1310 Dysphagia, unspecified: Secondary | ICD-10-CM | POA: Diagnosis not present

## 2014-12-07 DIAGNOSIS — Z860100 Personal history of colon polyps, unspecified: Secondary | ICD-10-CM

## 2014-12-07 DIAGNOSIS — Z0000 Encounter for general adult medical examination without abnormal findings: Secondary | ICD-10-CM

## 2014-12-07 DIAGNOSIS — Z720 Tobacco use: Secondary | ICD-10-CM

## 2014-12-07 DIAGNOSIS — J302 Other seasonal allergic rhinitis: Secondary | ICD-10-CM

## 2014-12-07 DIAGNOSIS — Z23 Encounter for immunization: Secondary | ICD-10-CM | POA: Diagnosis not present

## 2014-12-07 DIAGNOSIS — I2510 Atherosclerotic heart disease of native coronary artery without angina pectoris: Secondary | ICD-10-CM

## 2014-12-07 DIAGNOSIS — M5440 Lumbago with sciatica, unspecified side: Secondary | ICD-10-CM

## 2014-12-07 DIAGNOSIS — Z1211 Encounter for screening for malignant neoplasm of colon: Secondary | ICD-10-CM

## 2014-12-07 MED ORDER — ALBUTEROL SULFATE HFA 108 (90 BASE) MCG/ACT IN AERS: 1.0000 | INHALATION_SPRAY | Freq: Four times a day (QID) | RESPIRATORY_TRACT | Status: DC | PRN

## 2014-12-07 MED ORDER — CETIRIZINE HCL 10 MG PO CAPS: 10.0000 mg | ORAL_CAPSULE | Freq: Every day | ORAL | Status: DC | PRN

## 2014-12-07 MED ORDER — AMLODIPINE BESYLATE 5 MG PO TABS: 5.0000 mg | ORAL_TABLET | Freq: Every day | ORAL | Status: DC

## 2014-12-07 MED ORDER — RANITIDINE HCL 150 MG PO TABS: 150.0000 mg | ORAL_TABLET | Freq: Every day | ORAL | Status: DC

## 2014-12-07 MED ORDER — HYDROCODONE-ACETAMINOPHEN 5-325 MG PO TABS: 0.5000 | ORAL_TABLET | Freq: Four times a day (QID) | ORAL | Status: DC | PRN

## 2014-12-07 MED ORDER — ISOSORBIDE MONONITRATE ER 30 MG PO TB24: 30.0000 mg | ORAL_TABLET | Freq: Every day | ORAL | Status: DC

## 2014-12-07 MED ORDER — BENAZEPRIL HCL 20 MG PO TABS: 20.0000 mg | ORAL_TABLET | Freq: Every day | ORAL | Status: DC

## 2014-12-07 MED ORDER — METOPROLOL TARTRATE 50 MG PO TABS: 50.0000 mg | ORAL_TABLET | Freq: Two times a day (BID) | ORAL | Status: DC

## 2014-12-07 MED ORDER — FLUTICASONE PROPIONATE 50 MCG/ACT NA SUSP: 2.0000 | Freq: Every day | NASAL | Status: DC | PRN

## 2014-12-07 NOTE — Progress Notes (Signed)
Subjective:    Brandon [name] is a 74 y.o. male who presents for Medicare Annual/Subsequent preventive examination.   Preventive Screening-Counseling & Management  Tobacco History  Smoking status  . Current Every Day Smoker -- 1.50 packs/day for 50 years  . Types: Cigarettes  . Last Attempt to Quit: 04/06/2011  Smokeless tobacco  . Former Systems developer    Comment: smoked less than 1 ppd for 40+ years; Using Vapor Cig    Problems Prior to Visit 1. Hyperlipidemia -- Currently on Lipitor daily. Is noting increased joint aches with daily lipitor. Would like to discuss alternative medications  2. Patient c/o some dysphagia with solid foods over the past few months. Patient with history of hiatal hernia and esophageal strictures. Denies dysphagia with liquids.  Denies heart burn or acid reflux symptoms. Denies choking on food.   Current Problems (verified) Patient Active Problem List   Diagnosis Date Noted  . Tobacco abuse disorder 01/10/2014  . Left shoulder pain 01/10/2014  . Dizziness 11/23/2013  . Seasonal allergies 11/23/2013  . Hypertension 05/19/2013  . Low back pain syndrome 05/19/2013  . Osteoarthritis 05/29/2009  . 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 FOOT PAIN 01/10/2009  . CAD, NATIVE VESSEL 09/15/2008  . CHEST PAIN UNSPECIFIED 08/02/2008  . Aortic valve disorder 01/28/2008  . PERSONAL HX COLONIC POLYPS 12/01/2007  . FUNCTIONAL DIARRHEA 09/24/2007  . BRAIN STEM STROKE 08/27/2007  . Allergic rhinitis 08/27/2007  . 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 LOW BACK PAIN SYNDROME 08/27/2007  . BENIGN PROSTATIC HYPERTROPHY, HX OF 08/27/2007  . Hyperlipidemia 03/19/2007  . ANXIETY 03/19/2007  . GERD 03/19/2007    Medications Prior to Visit Current Outpatient Prescriptions on File Prior to Visit  Medication Sig Dispense Refill  . albuterol (PROVENTIL HFA;VENTOLIN HFA) 108 (90 BASE) MCG/ACT inhaler Inhale 1-2 puffs into the lungs every 6 (six) hours as needed for wheezing or shortness of  breath.    Marland Kitchen amLODipine (NORVASC) 5 MG tablet Take 1 tablet (5 mg total) by mouth daily. 90 tablet 2  . aspirin 81 MG EC tablet Take 81 mg by mouth daily.      . benazepril (LOTENSIN) 20 MG tablet Take 1 tablet (20 mg total) by mouth daily. 90 tablet 1  . Cetirizine HCl 10 MG CAPS Take 10 mg by mouth daily as needed.     . cholecalciferol (VITAMIN D) 1000 UNITS tablet Take 1,000 Units by mouth as directed.     . fluticasone (FLONASE) 50 MCG/ACT nasal spray Place 2 sprays into both nostrils daily. (Patient taking differently: Place 2 sprays into both nostrils daily as needed. ) 16 g 6  . HYDROcodone-acetaminophen (NORCO/VICODIN) 5-325 MG per tablet Take 0.5-1 tablets by mouth every 6 (six) hours as needed for moderate pain. 90 tablet 0  . isosorbide mononitrate (IMDUR) 30 MG 24 hr tablet TAKE 1 TABLET BY MOUTH AT BEDTIME 30 tablet 6  . loperamide (IMODIUM A-D) 2 MG tablet Take 2 mg by mouth daily as needed for diarrhea or loose stools.     . metoprolol (LOPRESSOR) 50 MG tablet Take 1 tablet (50 mg total) by mouth 2 (two) times daily. 180 tablet 1  . nabumetone (RELAFEN) 500 MG tablet Take 1 tablet (500 mg total) by mouth daily. (Patient taking differently: Take 500 mg by mouth daily as needed. ) 30 tablet 0  . nitroGLYCERIN (NITROSTAT) 0.4 MG SL tablet Place 1 tablet (0.4 mg total) under the tongue every 5 (five) minutes as needed for chest pain. 30 tablet  0  . Omega-3 Fatty Acids (FISH OIL) 1000 MG CAPS Take 2 capsules by mouth 2 (two) times daily.      . RaNITidine HCl (ZANTAC PO) Take by mouth.    . vitamin B-12 (CYANOCOBALAMIN) 1000 MCG tablet Take 1,000 mcg by mouth daily.       No current facility-administered medications on file prior to visit.    Current Medications (verified) Current Outpatient Prescriptions  Medication Sig Dispense Refill  . albuterol (PROVENTIL HFA;VENTOLIN HFA) 108 (90 BASE) MCG/ACT inhaler Inhale 1-2 puffs into the lungs every 6 (six) hours as needed for wheezing  or shortness of breath.    Marland Kitchen amLODipine (NORVASC) 5 MG tablet Take 1 tablet (5 mg total) by mouth daily. 90 tablet 2  . aspirin 81 MG EC tablet Take 81 mg by mouth daily.      . benazepril (LOTENSIN) 20 MG tablet Take 1 tablet (20 mg total) by mouth daily. 90 tablet 1  . Cetirizine HCl 10 MG CAPS Take 10 mg by mouth daily as needed.     . cholecalciferol (VITAMIN D) 1000 UNITS tablet Take 1,000 Units by mouth as directed.     . fluticasone (FLONASE) 50 MCG/ACT nasal spray Place 2 sprays into both nostrils daily. (Patient taking differently: Place 2 sprays into both nostrils daily as needed. ) 16 g 6  . HYDROcodone-acetaminophen (NORCO/VICODIN) 5-325 MG per tablet Take 0.5-1 tablets by mouth every 6 (six) hours as needed for moderate pain. 90 tablet 0  . isosorbide mononitrate (IMDUR) 30 MG 24 hr tablet TAKE 1 TABLET BY MOUTH AT BEDTIME 30 tablet 6  . loperamide (IMODIUM A-D) 2 MG tablet Take 2 mg by mouth daily as needed for diarrhea or loose stools.     . metoprolol (LOPRESSOR) 50 MG tablet Take 1 tablet (50 mg total) by mouth 2 (two) times daily. 180 tablet 1  . nabumetone (RELAFEN) 500 MG tablet Take 1 tablet (500 mg total) by mouth daily. (Patient taking differently: Take 500 mg by mouth daily as needed. ) 30 tablet 0  . nitroGLYCERIN (NITROSTAT) 0.4 MG SL tablet Place 1 tablet (0.4 mg total) under the tongue every 5 (five) minutes as needed for chest pain. 30 tablet 0  . Omega-3 Fatty Acids (FISH OIL) 1000 MG CAPS Take 2 capsules by mouth 2 (two) times daily.      . RaNITidine HCl (ZANTAC PO) Take by mouth.    . vitamin B-12 (CYANOCOBALAMIN) 1000 MCG tablet Take 1,000 mcg by mouth daily.       No current facility-administered medications for this visit.     Allergies (verified) Celecoxib   PAST HISTORY  Family History Family History  Problem Relation Age of Onset  . Alzheimer's disease Mother 67    Deceased  . Arthritis/Rheumatoid Mother   . Heart disease Father 46    Living  .  Coronary artery disease Father   . Stomach cancer Maternal Uncle   . Brain cancer Maternal Aunt     x2  . Healthy Brother   . Obesity Daughter     Had Bypass Sx    Social History Social History  Substance Use Topics  . Smoking status: Current Every Day Smoker -- 1.50 packs/day for 50 years    Types: Cigarettes    Last Attempt to Quit: 04/06/2011  . Smokeless tobacco: Former Systems developer     Comment: smoked less than 1 ppd for 40+ years; Using Administrator, sports  . Alcohol Use: 0.0 oz/week  0 Standard drinks or equivalent per week     Comment: social    Are there smokers in your home (other than you)?  No  Risk Factors Current exercise habits: Home exercise routine includes walking 1/2 hrs per day.  Dietary issues discussed: Endorses well-balanced diet. Body mass index is 26.31 kg/(m^2).   Cardiac risk factors: advanced age (older than 31 for men, 54 for women), dyslipidemia, family history of premature cardiovascular disease, male gender and smoking/ tobacco exposure.  Depression Screen (Note: if answer to either of the following is "Yes", a more complete depression screening is indicated)   Q1: Over the past two weeks, have you felt down, depressed or hopeless? No  Q2: Over the past two weeks, have you felt little interest or pleasure in doing things? No  Have you lost interest or pleasure in daily life? No  Do you often feel hopeless? No  Do you cry easily over simple problems? No  Activities of Daily Living In your present state of health, do you have any difficulty performing the following activities?:  Driving? No Managing money?  No Feeding yourself? No Getting from bed to chair? No Climbing a flight of stairs? No Preparing food and eating?: No Bathing or showering? No Getting dressed: No Getting to the toilet? No Using the toilet:No Moving around from place to place: No In the past year have you fallen or had a near fall?:No   Are you sexually active?  No  Do you have  more than one partner?  N/A  Hearing Difficulties: No Do you often ask people to speak up or repeat themselves? No Do you experience ringing or noises in your ears? No Do you have difficulty understanding soft or whispered voices? No   Do you feel that you have a problem with memory? Yes -- Endorses difficulties remembering special occasions only.  Do you often misplace items? No  Do you feel safe at home?  Yes  Cognitive Testing  Alert? Yes  Normal Appearance?Yes  Oriented to person? Yes Place? Yes   Time? Yes  Recall of three objects?  Yes  Can perform simple calculations? Yes  Displays appropriate judgment?Yes  Can read the correct time from a watch face?Yes   Advanced Directives have been discussed with the patient? Yes   List the Names of Other Physician/Practitioners you currently use: See Care Teams in the EMR for a comprehensive list. List is up-to-date at time of visit.  Indicate any recent Medical Services you may have received from other than [facility] providers in the past year (date may be approximate).  Immunization History  Administered Date(s) Administered  . Influenza Split 11/26/2010, 10/11/2011, 11/03/2012  . Influenza Whole 11/14/2006, 12/04/2007, 10/06/2008, 10/25/2009  . Influenza-Unspecified 11/19/2013    Screening Tests Health Maintenance  Topic Date Due  . DTaP/Tdap/Td (1 - Tdap) 10/03/1961  . TETANUS/TDAP  10/03/1961  . ZOSTAVAX  10/04/2002  . PNA vac Low Risk Adult (1 of 2 - PCV13) 10/04/2007  . COLONOSCOPY  10/20/2012  . INFLUENZA VACCINE  09/06/2014    All answers were reviewed with the patient and necessary referrals were made:  Leeanne Rio, PA-C   12/07/2014   History reviewed: allergies, current medications, past family history, past medical history, past social history, past surgical history and problem list  Review of Systems Pertinent items noted in HPI and remainder of comprehensive ROS otherwise negative.    Objective:      Vision by Snellen chart: right eye:20/20, left  eye:20/20 Blood pressure 154/73, pulse 63, temperature 97.9 F (36.6 C), temperature source Oral, resp. rate 16, height 5\' 10"  (1.778 m), weight 183 lb 6 oz (83.178 kg), SpO2 98 %. Body mass index is 26.31 kg/(m^2).  General appearance: alert, cooperative, appears stated age and no distress Head: Normocephalic, without obvious abnormality, atraumatic Eyes: conjunctivae/corneas clear. PERRL, EOM's intact. Fundi benign. Ears: normal TM's and external ear canals both ears Nose: Nares normal. Septum midline. Mucosa normal. No drainage or sinus tenderness. Throat: lips, mucosa, and tongue normal; teeth and gums normal Lungs: clear to auscultation bilaterally Heart: regular rate and rhythm, S1, S2 normal, no murmur, click, rub or gallop Abdomen: soft, non-tender; bowel sounds normal; no masses,  no organomegaly Extremities: extremities normal, atraumatic, no cyanosis or edema     Assessment:     (1) Medicare Wellness, Subsequent (2) Hyperlipidemia (3) Hypertension (4) GERD (5) CAD, native vessel (6) Dysphagia       Plan:     (1) During the course of the visit the patient was educated and counseled about appropriate screening and preventive services including:    Pneumococcal vaccine   Influenza vaccine  Prostate cancer screening  Colorectal cancer screening  Diabetes screening  Nutrition counseling   Smoking cessation counseling  (2) Endorses myalgias with current regimen. Will repeat fasting lipid panel and LFTS. Will decide on alternative statin medication.  (3) BP above goal today. Patient has not taken medication yet today. Asymptomatic. Discussed importance of medication compliance. Always take before appointment. Continue 81 mg ASA daily.  (4) Well-controlled. Continue current regimen.  (5) Followed by Cardiology. Continue BP regimen and ASA daily. Encouraged increased exercise and low-cholesterol diet. Will  alter statin therapy based on results. Follow-up with Cardiology as scheduled.  (6) New onset. History of GERD but well-controlled per patient. Will refer to GI for further assessment.   Patient Instructions (the written plan) was given to the patient.  Medicare Attestation I have personally reviewed: The patient's medical and social history Their use of alcohol, tobacco or illicit drugs Their current medications and supplements The patient's functional ability including ADLs,fall risks, home safety risks, cognitive, and hearing and visual impairment Diet and physical activities Evidence for depression or mood disorders  The patient's weight, height, BMI, and visual acuity have been recorded in the chart.  I have made referrals, counseling, and provided education to the patient based on review of the above and I have provided the patient with a written personalized care plan for preventive services.     Raiford Noble [place], [place]   12/07/2014

## 2014-12-07 NOTE — Progress Notes (Signed)
Pre visit review using our clinic review tool, if applicable. No additional management support is needed unless otherwise documented below in the visit note/SLS  

## 2014-12-07 NOTE — Patient Instructions (Signed)
Please schedule an appointment for tomorrow for the lab I will call you with your results.  We will start a new cholesterol medication due to joint aches but will wait until cholesterol is assessed.  Please follow-up with Dr. Marigene Ehlers and Dr. Risa Grill as scheduled.  Follow-up with me in 6 months.  Preventive Care for Adults, Male A healthy lifestyle and preventive care can promote health and wellness. Preventive health guidelines for men include the following key practices:  A routine yearly physical is a good way to check with your health care provider about your health and preventative screening. It is a chance to share any concerns and updates on your health and to receive a thorough exam.  Visit your dentist for a routine exam and preventative care every 6 months. Brush your teeth twice a day and floss once a day. Good oral hygiene prevents tooth decay and gum disease.  The frequency of eye exams is based on your age, health, family medical history, use of contact lenses, and other factors. Follow your health care provider's recommendations for frequency of eye exams.  Eat a healthy diet. Foods such as vegetables, fruits, whole grains, low-fat dairy products, and lean protein foods contain the nutrients you need without too many calories. Decrease your intake of foods high in solid fats, added sugars, and salt. Eat the right amount of calories for you.Get information about a proper diet from your health care provider, if necessary.  Regular physical exercise is one of the most important things you can do for your health. Most adults should get at least 150 minutes of moderate-intensity exercise (any activity that increases your heart rate and causes you to sweat) each week. In addition, most adults need muscle-strengthening exercises on 2 or more days a week.  Maintain a healthy weight. The body mass index (BMI) is a screening tool to identify possible weight problems. It provides an estimate  of body fat based on height and weight. Your health care provider can find your BMI and can help you achieve or maintain a healthy weight.For adults 20 years and older:  A BMI below 18.5 is considered underweight.  A BMI of 18.5 to 24.9 is normal.  A BMI of 25 to 29.9 is considered overweight.  A BMI of 30 and above is considered obese.  Maintain normal blood lipids and cholesterol levels by exercising and minimizing your intake of saturated fat. Eat a balanced diet with plenty of fruit and vegetables. Blood tests for lipids and cholesterol should begin at age 24 and be repeated every 5 years. If your lipid or cholesterol levels are high, you are over 50, or you are at high risk for heart disease, you may need your cholesterol levels checked more frequently.Ongoing high lipid and cholesterol levels should be treated with medicines if diet and exercise are not working.  If you smoke, find out from your health care provider how to quit. If you do not use tobacco, do not start.  Lung cancer screening is recommended for adults aged 87-80 years who are at high risk for developing lung cancer because of a history of smoking. A yearly low-dose CT scan of the lungs is recommended for people who have at least a 30-pack-year history of smoking and are a current smoker or have quit within the past 15 years. A pack year of smoking is smoking an average of 1 pack of cigarettes a day for 1 year (for example: 1 pack a day for 30 years or  2 packs a day for 15 years). Yearly screening should continue until the smoker has stopped smoking for at least 15 years. Yearly screening should be stopped for people who develop a health problem that would prevent them from having lung cancer treatment.  If you choose to drink alcohol, do not have more than 2 drinks per day. One drink is considered to be 12 ounces (355 mL) of beer, 5 ounces (148 mL) of wine, or 1.5 ounces (44 mL) of liquor.  Avoid use of street drugs. Do not  share needles with anyone. Ask for help if you need support or instructions about stopping the use of drugs.  High blood pressure causes heart disease and increases the risk of stroke. Your blood pressure should be checked at least every 1-2 years. Ongoing high blood pressure should be treated with medicines, if weight loss and exercise are not effective.  If you are 59-9 years old, ask your health care provider if you should take aspirin to prevent heart disease.  Diabetes screening is done by taking a blood sample to check your blood glucose level after you have not eaten for a certain period of time (fasting). If you are not overweight and you do not have risk factors for diabetes, you should be screened once every 3 years starting at age 26. If you are overweight or obese and you are 73-33 years of age, you should be screened for diabetes every year as part of your cardiovascular risk assessment.  Colorectal cancer can be detected and often prevented. Most routine colorectal cancer screening begins at the age of 27 and continues through age 20. However, your health care provider may recommend screening at an earlier age if you have risk factors for colon cancer. On a yearly basis, your health care provider may provide home test kits to check for hidden blood in the stool. Use of a small camera at the end of a tube to directly examine the colon (sigmoidoscopy or colonoscopy) can detect the earliest forms of colorectal cancer. Talk to your health care provider about this at age 77, when routine screening begins. Direct exam of the colon should be repeated every 5-10 years through age 33, unless early forms of precancerous polyps or small growths are found.  People who are at an increased risk for hepatitis B should be screened for this virus. You are considered at high risk for hepatitis B if:  You were born in a country where hepatitis B occurs often. Talk with your health care provider about which  countries are considered high risk.  Your parents were born in a high-risk country and you have not received a shot to protect against hepatitis B (hepatitis B vaccine).  You have HIV or AIDS.  You use needles to inject street drugs.  You live with, or have sex with, someone who has hepatitis B.  You are a man who has sex with other men (MSM).  You get hemodialysis treatment.  You take certain medicines for conditions such as cancer, organ transplantation, and autoimmune conditions.  Hepatitis C blood testing is recommended for all people born from [birth year] through 1965 and any individual with known risks for hepatitis C.  Practice safe sex. Use condoms and avoid high-risk sexual practices to reduce the spread of sexually transmitted infections (STIs). STIs include gonorrhea, chlamydia, syphilis, trichomonas, herpes, HPV, and human immunodeficiency virus (HIV). Herpes, HIV, and HPV are viral illnesses that have no cure. They can result in disability, cancer, and  death.  If you are a man who has sex with other men, you should be screened at least once per year for:  HIV.  Urethral, rectal, and pharyngeal infection of gonorrhea, chlamydia, or both.  If you are at risk of being infected with HIV, it is recommended that you take a prescription medicine daily to prevent HIV infection. This is called preexposure prophylaxis (PrEP). You are considered at risk if:  You are a man who has sex with other men (MSM) and have other risk factors.  You are a heterosexual man, are sexually active, and are at increased risk for HIV infection.  You take drugs by injection.  You are sexually active with a partner who has HIV.  Talk with your health care provider about whether you are at high risk of being infected with HIV. If you choose to begin PrEP, you should first be tested for HIV. You should then be tested every 3 months for as long as you are taking PrEP.  A one-time screening for abdominal  aortic aneurysm (AAA) and surgical repair of large AAAs by ultrasound are recommended for men ages 5 to 38 years who are current or former smokers.  Healthy men should no longer receive prostate-specific antigen (PSA) blood tests as part of routine cancer screening. Talk with your health care provider about prostate cancer screening.  Testicular cancer screening is not recommended for adult males who have no symptoms. Screening includes self-exam, a health care provider exam, and other screening tests. Consult with your health care provider about any symptoms you have or any concerns you have about testicular cancer.  Use sunscreen. Apply sunscreen liberally and repeatedly throughout the day. You should seek shade when your shadow is shorter than you. Protect yourself by wearing long sleeves, pants, a wide-brimmed hat, and sunglasses year round, whenever you are outdoors.  Once a month, do a whole-body skin exam, using a mirror to look at the skin on your back. Tell your health care provider about new moles, moles that have irregular borders, moles that are larger than a pencil eraser, or moles that have changed in shape or color.  Stay current with required vaccines (immunizations).  Influenza vaccine. All adults should be immunized every year.  Tetanus, diphtheria, and acellular pertussis (Td, Tdap) vaccine. An adult who has not previously received Tdap or who does not know his vaccine status should receive 1 dose of Tdap. This initial dose should be followed by tetanus and diphtheria toxoids (Td) booster doses every 10 years. Adults with an unknown or incomplete history of completing a 3-dose immunization series with Td-containing vaccines should begin or complete a primary immunization series including a Tdap dose. Adults should receive a Td booster every 10 years.  Varicella vaccine. An adult without evidence of immunity to varicella should receive 2 doses or a second dose if he has previously  received 1 dose.  Human papillomavirus (HPV) vaccine. Males aged 11-21 years who have not received the vaccine previously should receive the 3-dose series. Males aged 22-26 years may be immunized. Immunization is recommended through the age of 93 years for any male who has sex with males and did not get any or all doses earlier. Immunization is recommended for any person with an immunocompromised condition through the age of 47 years if he did not get any or all doses earlier. During the 3-dose series, the second dose should be obtained 4-8 weeks after the first dose. The third dose should be obtained 24  weeks after the first dose and 16 weeks after the second dose.  Zoster vaccine. One dose is recommended for adults aged 14 years or older unless certain conditions are present.  Measles, mumps, and rubella (MMR) vaccine. Adults born before [birth year] generally are considered immune to measles and mumps. Adults born in [birth year] or later should have 1 or more doses of MMR vaccine unless there is a contraindication to the vaccine or there is laboratory evidence of immunity to each of the three diseases. A routine second dose of MMR vaccine should be obtained at least 28 days after the first dose for students attending postsecondary schools, health care workers, or international travelers. People who received inactivated measles vaccine or an unknown type of measles vaccine during 1963-1967 should receive 2 doses of MMR vaccine. People who received inactivated mumps vaccine or an unknown type of mumps vaccine before 1979 and are at high risk for mumps infection should consider immunization with 2 doses of MMR vaccine. Unvaccinated health care workers born before [birth year] who lack laboratory evidence of measles, mumps, or rubella immunity or laboratory confirmation of disease should consider measles and mumps immunization with 2 doses of MMR vaccine or rubella immunization with 1 dose of MMR vaccine.  Pneumococcal 13-valent  conjugate (PCV13) vaccine. When indicated, a person who is uncertain of his immunization history and has no record of immunization should receive the PCV13 vaccine. All adults 34 years of age and older should receive this vaccine. An adult aged 15 years or older who has certain medical conditions and has not been previously immunized should receive 1 dose of PCV13 vaccine. This PCV13 should be followed with a dose of pneumococcal polysaccharide (PPSV23) vaccine. Adults who are at high risk for pneumococcal disease should obtain the PPSV23 vaccine at least 8 weeks after the dose of PCV13 vaccine. Adults older than 72 years of age who have normal immune system function should obtain the PPSV23 vaccine dose at least 1 year after the dose of PCV13 vaccine.  Pneumococcal polysaccharide (PPSV23) vaccine. When PCV13 is also indicated, PCV13 should be obtained first. All adults aged 53 years and older should be immunized. An adult younger than age 6 years who has certain medical conditions should be immunized. Any person who resides in a nursing home or long-term care facility should be immunized. An adult smoker should be immunized. People with an immunocompromised condition and certain other conditions should receive both PCV13 and PPSV23 vaccines. People with human immunodeficiency virus (HIV) infection should be immunized as soon as possible after diagnosis. Immunization during chemotherapy or radiation therapy should be avoided. Routine use of PPSV23 vaccine is not recommended for American Indians, [place] Natives, or people younger than 65 years unless there are medical conditions that require PPSV23 vaccine. When indicated, people who have unknown immunization and have no record of immunization should receive PPSV23 vaccine. One-time revaccination 5 years after the first dose of PPSV23 is recommended for people aged 19-64 years who have chronic kidney failure, nephrotic syndrome, asplenia, or immunocompromised  conditions. People who received 1-2 doses of PPSV23 before age 85 years should receive another dose of PPSV23 vaccine at age 44 years or later if at least 5 years have passed since the previous dose. Doses of PPSV23 are not needed for people immunized with PPSV23 at or after age 60 years.  Meningococcal vaccine. Adults with asplenia or persistent complement component deficiencies should receive 2 doses of quadrivalent meningococcal conjugate (MenACWY-D) vaccine. The doses should be obtained at least  2 months apart. Microbiologists working with certain meningococcal bacteria, [employer] recruits, people at risk during an outbreak, and people who travel to or live in countries with a high rate of meningitis should be immunized. A first-year college student up through age 47 years who is living in a residence hall should receive a dose if he did not receive a dose on or after his 16th birthday. Adults who have certain high-risk conditions should receive one or more doses of vaccine.  Hepatitis A vaccine. Adults who wish to be protected from this disease, have chronic liver disease, work with hepatitis A-infected animals, work in hepatitis A research labs, or travel to or work in countries with a high rate of hepatitis A should be immunized. Adults who were previously unvaccinated and who anticipate close contact with an international adoptee during the first 60 days after arrival in the [place] from a country with a high rate of hepatitis A should be immunized.  Hepatitis B vaccine. Adults should be immunized if they wish to be protected from this disease, are under age 60 years and have diabetes, have chronic liver disease, have had more than one sex partner in the past 6 months, may be exposed to blood or other infectious body fluids, are household contacts or sex partners of hepatitis B positive people, are clients or workers in certain care facilities, or travel to or work in countries with a high rate of  hepatitis B.  Haemophilus influenzae type b (Hib) vaccine. A previously unvaccinated person with asplenia or sickle cell disease or having a scheduled splenectomy should receive 1 dose of Hib vaccine. Regardless of previous immunization, a recipient of a hematopoietic stem cell transplant should receive a 3-dose series 6-12 months after his successful transplant. Hib vaccine is not recommended for adults with HIV infection. Preventive Service / Frequency Ages 65 to 47  Blood pressure check.** / Every 3-5 years.  Lipid and cholesterol check.** / Every 5 years beginning at age 16.  Hepatitis C blood test.** / For any individual with known risks for hepatitis C.  Skin self-exam. / Monthly.  Influenza vaccine. / Every year.  Tetanus, diphtheria, and acellular pertussis (Tdap, Td) vaccine.** / Consult your health care provider. 1 dose of Td every 10 years.  Varicella vaccine.** / Consult your health care provider.  HPV vaccine. / 3 doses over 6 months, if 67 or younger.  Measles, mumps, rubella (MMR) vaccine.** / You need at least 1 dose of MMR if you were born in 1957 or later. You may also need a second dose.  Pneumococcal 13-valent conjugate (PCV13) vaccine.** / Consult your health care provider.  Pneumococcal polysaccharide (PPSV23) vaccine.** / 1 to 2 doses if you smoke cigarettes or if you have certain conditions.  Meningococcal vaccine.** / 1 dose if you are age 30 to 71 years and a [employer] researcher living in a residence hall, or have one of several medical conditions. You may also need additional booster doses.  Hepatitis A vaccine.** / Consult your health care provider.  Hepatitis B vaccine.** / Consult your health care provider.  Haemophilus influenzae type b (Hib) vaccine.** / Consult your health care provider. Ages 12 to 22  Blood pressure check.** / Every year.  Lipid and cholesterol check.** / Every 5 years beginning at age 38.  Lung cancer screening. /  Every year if you are aged 77-80 years and have a 30-pack-year history of smoking and currently smoke or have quit within the past 15 years.  Yearly screening is stopped once you have quit smoking for at least 15 years or develop a health problem that would prevent you from having lung cancer treatment.  Fecal occult blood test (FOBT) of stool. / Every year beginning at age 58 and continuing until age 46. You may not have to do this test if you get a colonoscopy every 10 years.  Flexible sigmoidoscopy** or colonoscopy.** / Every 5 years for a flexible sigmoidoscopy or every 10 years for a colonoscopy beginning at age 69 and continuing until age 49.  Hepatitis C blood test.** / For all people born from [birth year] through 1965 and any individual with known risks for hepatitis C.  Skin self-exam. / Monthly.  Influenza vaccine. / Every year.  Tetanus, diphtheria, and acellular pertussis (Tdap/Td) vaccine.** / Consult your health care provider. 1 dose of Td every 10 years.  Varicella vaccine.** / Consult your health care provider.  Zoster vaccine.** / 1 dose for adults aged 34 years or older.  Measles, mumps, rubella (MMR) vaccine.** / You need at least 1 dose of MMR if you were born in 1955 or later. You may also need a second dose.  Pneumococcal 13-valent conjugate (PCV13) vaccine.** / Consult your health care provider.  Pneumococcal polysaccharide (PPSV23) vaccine.** / 1 to 2 doses if you smoke cigarettes or if you have certain conditions.  Meningococcal vaccine.** / Consult your health care provider.  Hepatitis A vaccine.** / Consult your health care provider.  Hepatitis B vaccine.** / Consult your health care provider.  Haemophilus influenzae type b (Hib) vaccine.** / Consult your health care provider. Ages 90 and over  Blood pressure check.** / Every year.  Lipid and cholesterol check.**/ Every 5 years beginning at age 55.  Lung cancer screening. / Every year if you are aged 39-80  years and have a 30-pack-year history of smoking and currently smoke or have quit within the past 15 years. Yearly screening is stopped once you have quit smoking for at least 15 years or develop a health problem that would prevent you from having lung cancer treatment.  Fecal occult blood test (FOBT) of stool. / Every year beginning at age 96 and continuing until age 55. You may not have to do this test if you get a colonoscopy every 10 years.  Flexible sigmoidoscopy** or colonoscopy.** / Every 5 years for a flexible sigmoidoscopy or every 10 years for a colonoscopy beginning at age 35 and continuing until age 34.  Hepatitis C blood test.** / For all people born from [birth year] through 1963 and any individual with known risks for hepatitis C.  Abdominal aortic aneurysm (AAA) screening.** / A one-time screening for ages 71 to 60 years who are current or former smokers.  Skin self-exam. / Monthly.  Influenza vaccine. / Every year.  Tetanus, diphtheria, and acellular pertussis (Tdap/Td) vaccine.** / 1 dose of Td every 10 years.  Varicella vaccine.** / Consult your health care provider.  Zoster vaccine.** / 1 dose for adults aged 38 years or older.  Pneumococcal 13-valent conjugate (PCV13) vaccine.** / 1 dose for all adults aged 83 years and older.  Pneumococcal polysaccharide (PPSV23) vaccine.** / 1 dose for all adults aged 63 years and older.  Meningococcal vaccine.** / Consult your health care provider.  Hepatitis A vaccine.** / Consult your health care provider.  Hepatitis B vaccine.** / Consult your health care provider.  Haemophilus influenzae type b (Hib) vaccine.** / Consult your health care provider. **Family history and personal history of risk and conditions  may change your health care provider's recommendations.   This information is not intended to replace advice given to you by your health care provider. Make sure you discuss any questions you have with your health care  provider.   Document Released: 03/20/2001 Document Revised: 02/12/2014 Document Reviewed: 06/19/2010 Elsevier Interactive Patient Education [employer].

## 2014-12-08 ENCOUNTER — Other Ambulatory Visit: Payer: Medicare Other

## 2014-12-09 ENCOUNTER — Other Ambulatory Visit (INDEPENDENT_AMBULATORY_CARE_PROVIDER_SITE_OTHER): Payer: Medicare Other

## 2014-12-09 DIAGNOSIS — I10 Essential (primary) hypertension: Secondary | ICD-10-CM

## 2014-12-09 DIAGNOSIS — Z0000 Encounter for general adult medical examination without abnormal findings: Secondary | ICD-10-CM

## 2014-12-09 LAB — LIPID PANEL
CHOLESTEROL: 122 mg/dL (ref 0–200)
HDL: 30.5 mg/dL — ABNORMAL LOW (ref >39.00)
LDL CALC: 68 mg/dL (ref 0–99)
NonHDL: 91.11
Total CHOL/HDL Ratio: 4
Triglycerides: 115 mg/dL (ref 0.0–149.0)
VLDL: 23 mg/dL (ref 0.0–40.0)

## 2014-12-09 LAB — COMPREHENSIVE METABOLIC PANEL
ALBUMIN: 3.7 g/dL (ref 3.5–5.2)
ALK PHOS: 109 U/L (ref 39–117)
ALT: 13 U/L (ref 0–53)
AST: 12 U/L (ref 0–37)
BILIRUBIN TOTAL: 0.4 mg/dL (ref 0.2–1.2)
BUN: 9 mg/dL (ref 6–23)
CALCIUM: 9.1 mg/dL (ref 8.4–10.5)
CO2: 31 meq/L (ref 19–32)
CREATININE: 0.76 mg/dL (ref 0.40–1.50)
Chloride: 105 mEq/L (ref 96–112)
GFR: 107.1 mL/min (ref >60.00)
Glucose, Bld: 116 mg/dL — ABNORMAL HIGH (ref 70–99)
Potassium: 4.3 mEq/L (ref 3.5–5.1)
Sodium: 141 mEq/L (ref 135–145)
TOTAL PROTEIN: 6.6 g/dL (ref 6.0–8.3)

## 2014-12-09 LAB — URINALYSIS, ROUTINE W REFLEX MICROSCOPIC
BILIRUBIN URINE: NEGATIVE
KETONES UR: NEGATIVE
LEUKOCYTES UA: NEGATIVE
Nitrite: NEGATIVE
PH: 8 (ref 5.0–8.0)
Specific Gravity, Urine: 1.01 (ref 1.000–1.030)
Total Protein, Urine: NEGATIVE
UROBILINOGEN UA: 0.2 (ref 0.0–1.0)
Urine Glucose: NEGATIVE
WBC UA: NONE SEEN (ref 0–?)

## 2014-12-09 LAB — CBC
HCT: 42.5 % (ref 39.0–52.0)
HEMOGLOBIN: 14.2 g/dL (ref 13.0–17.0)
MCHC: 33.3 g/dL (ref 30.0–36.0)
MCV: 86.5 fl (ref 78.0–100.0)
PLATELETS: 168 10*3/uL (ref 150.0–400.0)
RBC: 4.91 Mil/uL (ref 4.22–5.81)
RDW: 13.5 % (ref 11.5–15.5)
WBC: 8.2 10*3/uL (ref 4.0–10.5)

## 2014-12-09 LAB — HEMOGLOBIN A1C: HEMOGLOBIN A1C: 5.9 % (ref 4.6–6.5)

## 2014-12-10 ENCOUNTER — Other Ambulatory Visit: Payer: Self-pay | Admitting: Physician Assistant

## 2014-12-10 MED ORDER — SIMVASTATIN 20 MG PO TABS: 20.0000 mg | ORAL_TABLET | Freq: Every day | ORAL | Status: DC

## 2014-12-10 NOTE — Telephone Encounter (Signed)
Refill sent per [facility] refill protocol/SLS **Patient request 90-day Supply**

## 2014-12-10 NOTE — Addendum Note (Signed)
Addended by: Peggyann Shoals on: 12/10/2014 11:00 AM   Modules accepted: Orders

## 2014-12-23 ENCOUNTER — Encounter: Payer: Self-pay | Admitting: Gastroenterology

## 2015-01-19 ENCOUNTER — Telehealth: Payer: Self-pay | Admitting: Physician Assistant

## 2015-01-19 DIAGNOSIS — M5440 Lumbago with sciatica, unspecified side: Secondary | ICD-10-CM

## 2015-01-19 MED ORDER — HYDROCODONE-ACETAMINOPHEN 5-325 MG PO TABS: 0.5000 | ORAL_TABLET | Freq: Four times a day (QID) | ORAL | Status: DC | PRN

## 2015-01-19 NOTE — Telephone Encounter (Signed)
Rx is ready for pickup. He is due for UDS and will have to give sample at pick up.

## 2015-01-19 NOTE — Telephone Encounter (Signed)
Pt wife called for refill on hydrocodone. Pt has 4 left. Tries to take 1/day but sometimes takes 2/day. Please call when RX ready for pick up [phone].

## 2015-01-19 NOTE — Telephone Encounter (Signed)
Notified pt wife that RX ready and UDS needed (to have pt come before 5pm for lab any weekday)

## 2015-02-12 ENCOUNTER — Other Ambulatory Visit: Payer: Self-pay | Admitting: Physician Assistant

## 2015-02-23 ENCOUNTER — Ambulatory Visit (INDEPENDENT_AMBULATORY_CARE_PROVIDER_SITE_OTHER): Payer: Medicare Other | Admitting: Gastroenterology

## 2015-02-23 ENCOUNTER — Encounter: Payer: Self-pay | Admitting: Gastroenterology

## 2015-02-23 VITALS — BP 110/70 | HR 64 | Ht 70.0 in | Wt 176.2 lb

## 2015-02-23 DIAGNOSIS — Z86010 Personal history of colonic polyps: Secondary | ICD-10-CM | POA: Diagnosis not present

## 2015-02-23 DIAGNOSIS — R1314 Dysphagia, pharyngoesophageal phase: Secondary | ICD-10-CM | POA: Diagnosis not present

## 2015-02-23 DIAGNOSIS — K219 Gastro-esophageal reflux disease without esophagitis: Secondary | ICD-10-CM | POA: Diagnosis not present

## 2015-02-23 MED ORDER — NA SULFATE-K SULFATE-MG SULF 17.5-3.13-1.6 GM/177ML PO SOLN: 1.0000 | Freq: Once | ORAL | Status: DC

## 2015-02-23 MED ORDER — OMEPRAZOLE 20 MG PO CPDR: 20.0000 mg | DELAYED_RELEASE_CAPSULE | Freq: Every day | ORAL | Status: DC

## 2015-02-23 NOTE — Patient Instructions (Signed)
We have sent the following medications to your pharmacy for you to pick up at your convenience:omeprazole.  Discontinue ranitidine.  You have been scheduled for an endoscopy and colonoscopy. Please follow the written instructions given to you at your visit today. Please pick up your prep supplies at the pharmacy within the next 1-3 days. If you use inhalers (even only as needed), please bring them with you on the day of your procedure. Your physician has requested that you go to www.[email or web address].com and enter the access code given to you at your visit today. This web site gives a general overview about your procedure. However, you should still follow specific instructions given to you by our office regarding your preparation for the procedure.  Thank you for choosing me and [facility].  Pricilla Riffle. Dagoberto Ligas., MD., Marval Regal

## 2015-02-23 NOTE — Progress Notes (Signed)
    History of Present Illness: This is a 75 year old male referred by Brunetta Jeans, PA-C for the evaluation of GERD, dysphagia and a personal history of adenomatous colon polyps. He has a history of dysphagia and an esophageal stricture. He previously underwent EGD with dilation in 02/2006. He has had a return in solid food dysphagia over the past several months with more frequent reflux symptoms. Started taking ranitidine 150 mg daily and his reflux symptoms improved however due to his dysphagia persists. He is also overdue for surveillance colonoscopy. Denies weight loss, abdominal pain, constipation, diarrhea, change in stool caliber, melena, hematochezia, nausea, vomiting, chest pain.  Review of Systems: Pertinent positive and negative review of systems were noted in the above HPI section. All other review of systems were otherwise negative.  Current Medications, Allergies, Past Medical History, Past Surgical History, Family History and Social History were reviewed in [employer] record.  Physical Exam: General: Well developed, well nourished, no acute distress Head: Normocephalic and atraumatic Eyes:  sclerae anicteric, EOMI Ears: Normal auditory acuity Mouth: No deformity or lesions Neck: Supple, no masses or thyromegaly Lungs: Clear throughout to auscultation Heart: Regular rate and rhythm; no murmurs, rubs or bruits Abdomen: Soft, non tender and non distended. No masses, hepatosplenomegaly or hernias noted. Normal Bowel sounds Rectal:  Deferred to colonoscopy Musculoskeletal: Symmetrical with no gross deformities  Skin: No lesions on visible extremities Pulses:  Normal pulses noted Extremities: No clubbing, cyanosis, edema or deformities noted Neurological: Alert oriented x 4, grossly nonfocal Cervical Nodes:  No significant cervical adenopathy Inguinal Nodes: No significant inguinal adenopathy Psychological:  Alert and cooperative. Normal mood and  affect  Assessment and Recommendations:  1.  GERD and dysphagia. Suspect a recurrent esophageal stricture. Begin omeprazole 20 mg daily and discontinue ranitidine. Follow standard antireflux measures. Schedule EGD. The risks (including bleeding, perforation, infection, missed lesions, medication reactions and possible hospitalization or surgery if complications occur), benefits, and alternatives to endoscopy with possible biopsy and possible dilation were discussed with the patient and they consent to proceed.   2.  Personal history of adenomatous colon polyps. Overdue for surveillance colonoscopy. Schedule colonoscopy. The risks (including bleeding, perforation, infection, missed lesions, medication reactions and possible hospitalization or surgery if complications occur), benefits, and alternatives to colonoscopy with possible biopsy and possible polypectomy were discussed with the patient and they consent to proceed.    cc: Brunetta Jeans, PA-C [place], [place] [id number]

## 2015-03-03 ENCOUNTER — Telehealth: Payer: Self-pay | Admitting: Physician Assistant

## 2015-03-03 NOTE — Telephone Encounter (Signed)
Caller name: [name]  Relationship to patient: Spouse  Can be reached: [phone]  Reason for call: Pt;s wife is requesting a refill on pt's HYDROcodone Rx.

## 2015-03-04 ENCOUNTER — Other Ambulatory Visit: Payer: Self-pay | Admitting: Physician Assistant

## 2015-03-04 DIAGNOSIS — M5440 Lumbago with sciatica, unspecified side: Secondary | ICD-10-CM

## 2015-03-04 MED ORDER — HYDROCODONE-ACETAMINOPHEN 5-325 MG PO TABS: 0.5000 | ORAL_TABLET | Freq: Four times a day (QID) | ORAL | Status: DC | PRN

## 2015-03-04 NOTE — Telephone Encounter (Signed)
Refill printed and placed at front desk for pick up.

## 2015-03-04 NOTE — Telephone Encounter (Signed)
Called pt to inform of the below.    She says that they will be in to pick up.    Thanks.

## 2015-03-23 ENCOUNTER — Ambulatory Visit (AMBULATORY_SURGERY_CENTER): Payer: Medicare Other | Admitting: Gastroenterology

## 2015-03-23 ENCOUNTER — Encounter: Payer: Self-pay | Admitting: Gastroenterology

## 2015-03-23 VITALS — BP 132/85 | HR 70 | Temp 98.3°F | Resp 17 | Ht 70.0 in | Wt 176.0 lb

## 2015-03-23 DIAGNOSIS — R1319 Other dysphagia: Secondary | ICD-10-CM

## 2015-03-23 DIAGNOSIS — R1310 Dysphagia, unspecified: Secondary | ICD-10-CM | POA: Diagnosis not present

## 2015-03-23 DIAGNOSIS — K222 Esophageal obstruction: Secondary | ICD-10-CM

## 2015-03-23 DIAGNOSIS — I2510 Atherosclerotic heart disease of native coronary artery without angina pectoris: Secondary | ICD-10-CM | POA: Diagnosis not present

## 2015-03-23 DIAGNOSIS — D125 Benign neoplasm of sigmoid colon: Secondary | ICD-10-CM

## 2015-03-23 DIAGNOSIS — E785 Hyperlipidemia, unspecified: Secondary | ICD-10-CM | POA: Diagnosis not present

## 2015-03-23 DIAGNOSIS — R1314 Dysphagia, pharyngoesophageal phase: Secondary | ICD-10-CM | POA: Diagnosis not present

## 2015-03-23 DIAGNOSIS — Z86010 Personal history of colonic polyps: Secondary | ICD-10-CM | POA: Diagnosis not present

## 2015-03-23 DIAGNOSIS — K219 Gastro-esophageal reflux disease without esophagitis: Secondary | ICD-10-CM | POA: Diagnosis not present

## 2015-03-23 DIAGNOSIS — D123 Benign neoplasm of transverse colon: Secondary | ICD-10-CM | POA: Diagnosis not present

## 2015-03-23 DIAGNOSIS — I10 Essential (primary) hypertension: Secondary | ICD-10-CM | POA: Diagnosis not present

## 2015-03-23 MED ORDER — SODIUM CHLORIDE 0.9 % IV SOLN: 500.0000 mL | INTRAVENOUS | Status: DC

## 2015-03-23 NOTE — Op Note (Signed)
[place]  [address] [place], [id number]   COLONOSCOPY PROCEDURE REPORT  PATIENT: Brandon [name], Brandon [name]  MR#: [id number] BIRTHDATE: 1940-07-27 , 89  yrs. old GENDER: male ENDOSCOPIST: Ladene Artist, MD, [facility] REFERRED BY: Raiford Noble, [facility] PROCEDURE DATE:  03/23/2015 PROCEDURE:   Colonoscopy, surveillance and Colonoscopy with snare polypectomy First Screening Colonoscopy - Avg.  risk and is 50 yrs.  old or older - No.  Prior Negative Screening - Now for repeat screening. N/A  History of Adenoma - Now for follow-up colonoscopy & has been > or = to 3 yrs.  Yes hx of adenoma.  Has been 3 or more years since last colonoscopy.  Polyps removed today? Yes ASA CLASS:   Class III INDICATIONS:Surveillance due to prior colonic neoplasia and PH Colon Adenoma. MEDICATIONS: Monitored anesthesia care and Propofol 150 mg IV DESCRIPTION OF PROCEDURE:   After the risks benefits and alternatives of the procedure were thoroughly explained, informed consent was obtained.  The digital rectal exam revealed no abnormalities of the rectum.   The LB [id number]  endoscope was introduced through the anus and advanced to the cecum, which was identified by both the appendix and ileocecal valve. No adverse events experienced.   The quality of the prep was good.  (Suprep was used)  The instrument was then slowly withdrawn as the colon was fully examined. Estimated blood loss is zero unless otherwise noted in this procedure report.  COLON FINDINGS: Two sessile polyps measuring 7 mm in size were found in the sigmoid colon and transverse colon.  Polypectomies were performed with a cold snare.  The resection was complete, the polyp tissue was completely retrieved and sent to histology.   There was moderate diverticulosis noted in the sigmoid colon and descending colon with associated colonic spasm, luminal narrowing and muscular hypertrophy. There was mild diverticulosis noted in the  transverse colon. The examination was otherwise normal.  Retroflexed views revealed internal Grade I hemorrhoids. The time to cecum = 1.8 Withdrawal time = 9.6   The scope was withdrawn and the procedure completed. COMPLICATIONS: There were no immediate complications.  ENDOSCOPIC IMPRESSION: 1.   Two sessile polyps in the sigmoid colon and transverse colon; polypectomies performed with a cold snare 2.   Moderate diverticulosis in the sigmoid colon and descending colon 3.   Mild diverticulosis in the transverse colon 4.   Grade l internal hemorrhoids  RECOMMENDATIONS: 1.  Await pathology results 2.  High fiber diet with liberal fluid intake. 3.  Repeat Colonoscopy in 5 years.  eSigned:  Ladene Artist, MD, [facility] 03/23/2015 3:09 PM

## 2015-03-23 NOTE — Patient Instructions (Signed)
YOU HAD AN ENDOSCOPIC PROCEDURE TODAY AT [facility]:   Refer to the procedure report that was given to you for any specific questions about what was found during the examination.  If the procedure report does not answer your questions, please call your gastroenterologist to clarify.  If you requested that your care partner not be given the details of your procedure findings, then the procedure report has been included in a sealed envelope for you to review at your convenience later.  YOU SHOULD EXPECT: Some feelings of bloating in the abdomen. Passage of more gas than usual.  Walking can help get rid of the air that was put into your GI tract during the procedure and reduce the bloating. If you had a lower endoscopy (such as a colonoscopy or flexible sigmoidoscopy) you may notice spotting of blood in your stool or on the toilet paper. If you underwent a bowel prep for your procedure, you may not have a normal bowel movement for a few days.  Please Note:  You might notice some irritation and congestion in your nose or some drainage.  This is from the oxygen used during your procedure.  There is no need for concern and it should clear up in a day or so.  SYMPTOMS TO REPORT IMMEDIATELY:   Following lower endoscopy (colonoscopy or flexible sigmoidoscopy):  Excessive amounts of blood in the stool  Significant tenderness or worsening of abdominal pains  Swelling of the abdomen that is new, acute  Fever of 100F or higher   Following upper endoscopy (EGD)  Vomiting of blood or coffee ground material  New chest pain or pain under the shoulder blades  Painful or persistently difficult swallowing  New shortness of breath  Fever of 100F or higher  Black, tarry-looking stools  For urgent or emergent issues, a gastroenterologist can be reached at any hour by calling [phone].    ACTIVITY:  You should plan to take it easy for the rest of today and you should NOT DRIVE or use  heavy machinery until tomorrow (because of the sedation medicines used during the test).    FOLLOW UP: Our staff will call the number listed on your records the next business day following your procedure to check on you and address any questions or concerns that you may have regarding the information given to you following your procedure. If we do not reach you, we will leave a message.  However, if you are feeling well and you are not experiencing any problems, there is no need to return our call.  We will assume that you have returned to your regular daily activities without incident.  If any biopsies were taken you will be contacted by phone or by letter within the next 1-3 weeks.  Please call us at [phone] if you have not heard about the biopsies in 3 weeks.    SIGNATURES/CONFIDENTIALITY: You and/or your care partner have signed paperwork which will be entered into your electronic medical record.  These signatures attest to the fact that that the information above on your After Visit Summary has been reviewed and is understood.  Full responsibility of the confidentiality of this discharge information lies with you and/or your care-partner.  Polyp/ Diverticulosis/ Hemorroid handout given Await pathology results Follow dilation diet

## 2015-03-23 NOTE — Op Note (Signed)
[place]  [address]. [place], [id number]   ENDOSCOPY PROCEDURE REPORT  PATIENT: Brandon [name], Brandon [name]  MR#: [id number] BIRTHDATE: 1940/08/02 , 25  yrs. old GENDER: male ENDOSCOPIST: Ladene Artist, MD, [facility] REFERRED BY:  Raiford Noble, [facility] PROCEDURE DATE:  03/23/2015 PROCEDURE:  EGD w/ wire guided (savary) dilation ASA CLASS:     Class III INDICATIONS:  dysphagia and history of esophageal reflux. MEDICATIONS: Monitored anesthesia care, Residual sedation present, and Propofol 100 mg IV TOPICAL ANESTHETIC: none DESCRIPTION OF PROCEDURE: After the risks benefits and alternatives of the procedure were thoroughly explained, informed consent was obtained.  The LB [id number] endoscope was introduced through the mouth and advanced to the second portion of the duodenum , Without limitations.  The instrument was slowly withdrawn as the mucosa was fully examined.   ESOPHAGUS: There was a short benign appearing stricture, with an inner diameter of 79mm, at the gastroesophageal junction.  The stricture was traversable.  The stricture was dilated using 90mm, 8mm, and 15 mm savary dilators over guidewire.  Mild resistance to all 3 and no heme noted. STOMACH: The mucosa and folds of the stomach appeared normal. DUODENUM: The duodenal mucosa showed no abnormalities in the bulb and 2nd part of the duodenum.  Retroflexed views revealed a small hiatal hernia.  The scope was then withdrawn from the patient and the procedure completed.  COMPLICATIONS: There were no immediate complications.  ENDOSCOPIC IMPRESSION: 1.   Short stricture at the gastroesophageal junction; dilated savary dilators over guidewire 2.   Small hiatal hernia  RECOMMENDATIONS: 1.  Anti-reflux regimen long term 2.  Continue PPI qam long term 3.  Post dilation instructions 4.  REV in 1 year   eSigned:  Ladene Artist, MD, [facility] 03/23/2015 3:14 PM

## 2015-03-23 NOTE — Progress Notes (Signed)
Report to PACU, RN, vss, BBS= Clear.  

## 2015-03-23 NOTE — Progress Notes (Signed)
Called to room to assist during endoscopic procedure.  Patient ID and intended procedure confirmed with present staff. Received instructions for my participation in the procedure from the performing physician.  

## 2015-03-24 ENCOUNTER — Telehealth: Payer: Self-pay | Admitting: Emergency Medicine

## 2015-03-24 ENCOUNTER — Encounter: Payer: Self-pay | Admitting: *Deleted

## 2015-03-24 NOTE — Telephone Encounter (Signed)
  Follow up Call-  Call back number 03/23/2015  Post procedure Call Back phone  # [phone]  Permission to leave phone message Yes     Patient questions:  Do you have a fever, pain , or abdominal swelling? No. Pain Score  0 *  Have you tolerated food without any problems? Yes.    Have you been able to return to your normal activities? Yes.    Do you have any questions about your discharge instructions: Diet   No. Medications  No. Follow up visit  No.  Do you have questions or concerns about your Care? No.  Actions: * If pain score is 4 or above: No action needed, pain <4.

## 2015-03-29 ENCOUNTER — Encounter: Payer: Self-pay | Admitting: Gastroenterology

## 2015-04-01 ENCOUNTER — Ambulatory Visit (INDEPENDENT_AMBULATORY_CARE_PROVIDER_SITE_OTHER): Payer: Medicare Other | Admitting: Cardiology

## 2015-04-01 ENCOUNTER — Encounter: Payer: Self-pay | Admitting: Cardiology

## 2015-04-01 VITALS — BP 130/64 | HR 68 | Ht 70.0 in | Wt 179.0 lb

## 2015-04-01 DIAGNOSIS — I350 Nonrheumatic aortic (valve) stenosis: Secondary | ICD-10-CM

## 2015-04-01 DIAGNOSIS — I2510 Atherosclerotic heart disease of native coronary artery without angina pectoris: Secondary | ICD-10-CM

## 2015-04-01 DIAGNOSIS — Z720 Tobacco use: Secondary | ICD-10-CM | POA: Diagnosis not present

## 2015-04-01 DIAGNOSIS — I359 Nonrheumatic aortic valve disorder, unspecified: Secondary | ICD-10-CM

## 2015-04-01 DIAGNOSIS — E785 Hyperlipidemia, unspecified: Secondary | ICD-10-CM | POA: Diagnosis not present

## 2015-04-01 MED ORDER — BUPROPION HCL ER (SR) 150 MG PO TB12: 150.0000 mg | ORAL_TABLET | ORAL | Status: DC

## 2015-04-01 NOTE — Patient Instructions (Addendum)
Medication Instructions:  START Wellbutrin for smoking cessation - take 1 tablet for the first 3 days and then 1 tablet twice a day   Labwork: None Ordered   Testing/Procedures: None Ordered   Follow-Up: Your physician wants you to follow-up in: 1 year with Dr. Aundra Dubin.  You will receive a reminder letter in the mail two months in advance. If you don't receive a letter, please call our office to schedule the follow-up appointment.   If you need a refill on your cardiac medications before your next appointment, please call your pharmacy.

## 2015-04-03 NOTE — Progress Notes (Signed)
Patient ID: Brandon [name], male   DOB: [birth year], 75 y.o.   MRN: [id number] PCP: Elyn Aquas  75 yo with history of CAD, HTN, and hyperlipidemia presents for followup.  Patient had described some exertional chest pain and myoview showed a partially reversible inferior defect, so LHC was done in 7/10.  This showed that his proximal RCA was totally occluded.  There were very robust left to right collaterals.  There was also a 50% proximal LAD stenosis.  The patient was managed medically.  Since that time, he has been doing well symptomatically.  He has had no further exertional chest pain.  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  He had myalgias on atorvastatin and is now taking simvastatin and not having any more myalgias.  About 6 months ago, he started smoking again.  He quit again a couple of weeks ago but still has a strong urge to smoke.  He says that stress caused the relapse.     Labs (12/10): HDL 28, LDL 70, K 4.3, creatinine 0.8 Labs (4/11): K 4, creatinine 0.8, LDL 53, HDL 26, LFTs normal Labs (12/11): LDL 74, HDL 27, K 4, creatinine 0.9, LFTs normal, TSH normal Labs (7/12): LDL 87, HDL 33 Labs (9/12): LDL 66, HDL 33, LFTs normal Labs (9/13): LDL 62, HDL 30, K 4.1, creatinine 0.8 Labs (4/14): LDL 88, HDL 27, K 4.4, creatinine 0.8 Labs (4/15): K 4.4, creatinine 0.76 Labs (4/16): LDL 69, HDL 32 Labs (11/16): K 4.3, creatinine 0.76, LDL 68, HDL 31  ECG: NSR, PVC  Allergies (verified):  1)  ! Celebrex  Past Medical History: 1. Hyperlipidemia: Myalgias with atorvastatin and Crestor. 2. Aortic stenosis.  Echocardiogram in August 2009 showed EF 60%, no regional wall motion abnormalities, mild LVH, moderate focal basal septal hypertrophy, and mild diastolic dysfunction.  There was a very mild aortic stenosis with partially fused left and right coronary cusps and some restricted motion of the  aortic valve.  The mean gradient, however, across the aortic valve was only 8 mmHg.  There was also mild left atrial enlargement and normal RV size and function.  Minimal AS on LHC in 7/10.  Echo (7/14) with EF 60-65%, mild LVH, mild AS mean gradient 13 mmHg.  Echo (6/16) with EF 55-60%, mild AS with mean gradient 14 mmHg.  3. Hypertension. 4. Gastroesophageal reflux disease: h/o esophageal stricture.  5. BPH. 6. Diverticulosis. 7. Smoked until 7/10. 8. CAD: LHC (7/10) showed totally occluded proximal RCA with very robust left to right collaterals, 50% proximal LAD stenosis, EF 65%.  Medical management.   9. Osteoarthritis 10. Colonic polyps 11. h/o TIA 12. 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  This manifested with slurred speech. 13. History of low back pain.  The patient is status post surgical back fusion. 14. History of cholecystectomy. 15. Cataract surgery 16. Prostate cancer: treated  Family History: Father alive age 34 w/ CAD & CABG at [facility] in 06/05/07 Mother died age 60 w/ Alzheimer's disease, hx of DJD 1 Sibling- Brother alive & well w/ DJD in his hips  Social History: Married- 83rd marriage, wife= Etta 2 children from 1st marriage, 4 step children Former smoker, 40+yrs of >1ppd, quit 7/10.  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    Current Outpatient  Prescriptions  Medication Sig Dispense Refill  . albuterol (PROVENTIL HFA;VENTOLIN HFA) 108 (90 BASE) MCG/ACT inhaler Inhale 1-2 puffs into the lungs every 6 (six) hours as needed for wheezing or shortness of breath. 3 Inhaler 1  . amLODipine (NORVASC) 5 MG tablet Take 1 tablet (5 mg total) by mouth daily. 90 tablet 1  . aspirin 81 MG EC tablet Take 81 mg by mouth daily.      . benazepril (LOTENSIN) 20 MG tablet Take 1 tablet (20 mg total) by mouth daily. 90  tablet 1  . buPROPion (WELLBUTRIN SR) 150 MG 12 hr tablet Take 1 tablet (150 mg total) by mouth as directed. Take 1 tablet for the first 3 days and then twice daily 180 tablet 3  . Cetirizine HCl 10 MG CAPS Take 1 capsule (10 mg total) by mouth daily as needed. 90 capsule 1  . cholecalciferol (VITAMIN D) 1000 UNITS tablet Take 1,000 Units by mouth as directed.     . fluticasone (FLONASE) 50 MCG/ACT nasal spray Place 2 sprays into both nostrils daily as needed. 48 g 1  . HYDROcodone-acetaminophen (NORCO/VICODIN) 5-325 MG tablet Take 0.5-1 tablets by mouth every 6 (six) hours as needed for moderate pain. 90 tablet 0  . isosorbide mononitrate (IMDUR) 30 MG 24 hr tablet Take 1 tablet (30 mg total) by mouth at bedtime. 90 tablet 1  . loperamide (IMODIUM A-D) 2 MG tablet Take 2 mg by mouth daily as needed for diarrhea or loose stools. Reported on 03/23/2015    . metoprolol (LOPRESSOR) 50 MG tablet Take 1 tablet (50 mg total) by mouth 2 (two) times daily. 180 tablet 1  . nabumetone (RELAFEN) 500 MG tablet Take 1 tablet (500 mg total) by mouth daily. (Patient taking differently: Take 500 mg by mouth daily as needed. ) 30 tablet 0  . Omega-3 Fatty Acids (FISH OIL) 1000 MG CAPS Take 2 capsules by mouth 2 (two) times daily.      Marland Kitchen omeprazole (PRILOSEC) 20 MG capsule Take 1 capsule (20 mg total) by mouth daily. 30 capsule 11  . ranitidine (ZANTAC) 150 MG tablet Take 1 tablet (150 mg total) by mouth at bedtime. 90 tablet 1  . simvastatin (ZOCOR) 20 MG tablet TAKE 1 TABLET(20 MG) BY MOUTH AT BEDTIME 30 tablet 5  . vitamin B-12 (CYANOCOBALAMIN) 1000 MCG tablet Take 1,000 mcg by mouth daily.       No current facility-administered medications for this visit.    BP 130/64 mmHg  Pulse 68  Ht 5\' 10"  (1.778 m)  Wt 179 lb (81.194 kg)  BMI 25.68 kg/m2 General:  Well developed, well nourished, in no acute distress. Neck:  Neck supple, no JVD. No masses, thyromegaly or abnormal cervical nodes. Lungs:  Clear  bilaterally to auscultation and percussion. Heart:  Non-displaced PMI, chest non-tender; regular rate and rhythm, S1, S2 without  rubs or gallops.  2/6 systolic crescendo-decrescendo early peaking murmur at RUSB.  Carotid upstroke normal, no bruit.  Pedals normal pulses. No edema, no varicosities. Abdomen:  Bowel sounds positive; abdomen soft and non-tender without masses, organomegaly, or hernias noted. No hepatosplenomegaly. Extremities:  No clubbing or cyanosis. Neurologic:  Alert and oriented x 3. Psych:  Normal affect.  Assessment/Plan:  AORTIC STENOSIS  Mild aortic stenosis by echo in 6/16, no progression.   CAD Stable with no ischemic symptoms. Continue ASA, statin, beta blocker, ACEI.  HYPERLIPIDEMIA  Good LDL in 11/16, he was on current dose of simvastatin at that time.  HYPERTENSION  BP is under good control.  SMOKING Recent relapse but quit again about 2 wks ago.  Still has urge to smoke.  Seems to be stress related. I would like him to try Wellbutrin to decrease smoking urge.  I gave him a prescription today.   Loralie Champagne 04/03/2015 7:34 AM

## 2015-04-06 ENCOUNTER — Other Ambulatory Visit: Payer: Self-pay | Admitting: Physician Assistant

## 2015-04-06 DIAGNOSIS — C4492 Squamous cell carcinoma of skin, unspecified: Secondary | ICD-10-CM

## 2015-04-11 ENCOUNTER — Telehealth: Payer: Self-pay | Admitting: Physician Assistant

## 2015-04-11 DIAGNOSIS — M5440 Lumbago with sciatica, unspecified side: Secondary | ICD-10-CM

## 2015-04-11 MED ORDER — HYDROCODONE-ACETAMINOPHEN 5-325 MG PO TABS: 0.5000 | ORAL_TABLET | Freq: Four times a day (QID) | ORAL | Status: DC | PRN

## 2015-04-11 NOTE — Telephone Encounter (Signed)
Refill granted. Rx printed and signed. Is ready for pickup at front desk.

## 2015-04-11 NOTE — Telephone Encounter (Signed)
Requesting Hydrocodone-acet-5-325mg -Take 0.5-1mg  tablet by mouth every 6 hours as needed for moderate pain. Last refill:03/04/15;#90,0 Last OV:12/07/14 UDS:01/19/15-Low risk-Next screen:07/20/15 Please advise.//AB/CMA

## 2015-04-11 NOTE — Telephone Encounter (Signed)
Relation to [id number] Call back [place]:  Reason for call:  Patient requesting a refill HYDROcodone-acetaminophen (NORCO/VICODIN) 5-325 MG tablet

## 2015-04-27 DIAGNOSIS — L570 Actinic keratosis: Secondary | ICD-10-CM | POA: Diagnosis not present

## 2015-04-27 DIAGNOSIS — L821 Other seborrheic keratosis: Secondary | ICD-10-CM | POA: Diagnosis not present

## 2015-04-27 DIAGNOSIS — D1801 Hemangioma of skin and subcutaneous tissue: Secondary | ICD-10-CM | POA: Diagnosis not present

## 2015-04-27 DIAGNOSIS — L820 Inflamed seborrheic keratosis: Secondary | ICD-10-CM | POA: Diagnosis not present

## 2015-05-04 DIAGNOSIS — H43811 Vitreous degeneration, right eye: Secondary | ICD-10-CM | POA: Diagnosis not present

## 2015-05-19 ENCOUNTER — Telehealth: Payer: Self-pay | Admitting: Physician Assistant

## 2015-05-19 ENCOUNTER — Other Ambulatory Visit: Payer: Self-pay

## 2015-05-19 DIAGNOSIS — M5440 Lumbago with sciatica, unspecified side: Secondary | ICD-10-CM

## 2015-05-19 MED ORDER — HYDROCODONE-ACETAMINOPHEN 5-325 MG PO TABS: 0.5000 | ORAL_TABLET | Freq: Four times a day (QID) | ORAL | Status: DC | PRN

## 2015-05-19 NOTE — Telephone Encounter (Signed)
Caller name: [name] Relationship to patient: wife Can be reached: [phone]  Reason for call: Pt wife called for refill on hydrocodone. He has 4-5 left and takes 1-2/day. They are going out of town Monday or Tuesday and he will need for the trip.

## 2015-05-19 NOTE — Telephone Encounter (Signed)
Called to make patient aware.  Pt was not available.  Made wife aware that Rx was ready for pick up.

## 2015-05-19 NOTE — Telephone Encounter (Signed)
Rx forwarded to Dr. Carollee Herter who is covering for [facility].

## 2015-05-19 NOTE — Telephone Encounter (Signed)
Rx printed and given to [facility] for review and signature.

## 2015-05-19 NOTE — Telephone Encounter (Signed)
Refill x1 

## 2015-05-19 NOTE — Telephone Encounter (Signed)
Entered in error

## 2015-05-19 NOTE — Telephone Encounter (Signed)
Last filled: 04/11/15 Amt: 90, 0 Last OV:  12/07/14 UDS:01/19/15-Low risk-Next screen:07/20/15  Please advise.

## 2015-05-19 NOTE — Telephone Encounter (Signed)
[place] in Cody's absence, will you sign Rx?

## 2015-05-19 NOTE — Telephone Encounter (Signed)
Im not in office today and the office is closed tomorrow. I am fine with refill but please have one of the other providers fill in my absence since it needs to be signed.

## 2015-05-20 ENCOUNTER — Other Ambulatory Visit: Payer: Self-pay | Admitting: Physician Assistant

## 2015-06-06 ENCOUNTER — Ambulatory Visit: Payer: Medicare Other | Admitting: Physician Assistant

## 2015-06-24 ENCOUNTER — Telehealth: Payer: Self-pay | Admitting: Physician Assistant

## 2015-06-24 NOTE — Telephone Encounter (Signed)
Caller name: [name] Relationship to patient: wife Can be reached: [phone]   Reason for call: Pt called billing office and they told her the insurance denied pymt on pts cpe DOS 12/07/2014 because he saw a physicians assistant. They told her there is nothing she can do about it. Pt states that insurance was made aware 2 years ago that supervising MD is Dr. Penni Homans and they have that noted in the system at [facility]. She is asking what we can do to help her. She states she left Ebony a msg already as well but before she called the billing number and got this info. Please f/u with pts wife.

## 2015-06-27 ENCOUNTER — Telehealth: Payer: Self-pay | Admitting: Physician Assistant

## 2015-06-27 DIAGNOSIS — M5440 Lumbago with sciatica, unspecified side: Secondary | ICD-10-CM

## 2015-06-27 NOTE — Telephone Encounter (Signed)
Caller name: [name] Relationship to patient: Wife Can be reached: [phone]  Reason for call: Request refill on HYDROcodone-acetaminophen (NORCO/VICODIN) 5-325 MG tablet [id number]

## 2015-06-27 NOTE — Telephone Encounter (Signed)
Requesting Hydrocodone-acet. 5-325mg -Take 0.5-1 tablet by mouth every 6 hours as needed for moderate pain. Last refill:05/19/15#90,0 Last OV:12/07/14-CPE UDS:01/19/15-Low risk-Next screen:07/20/15 Please advise.//AB/CMA

## 2015-06-28 MED ORDER — HYDROCODONE-ACETAMINOPHEN 5-325 MG PO TABS: 0.5000 | ORAL_TABLET | Freq: Four times a day (QID) | ORAL | Status: DC | PRN

## 2015-06-28 NOTE — Telephone Encounter (Signed)
Refill granted. Please call patient to let him know Rx can be picked up.

## 2015-06-28 NOTE — Telephone Encounter (Signed)
Called and spoke with the pt's wife and informed her of the note below.  She verbalized understanding and agreed.//AB/CMA

## 2015-06-29 ENCOUNTER — Other Ambulatory Visit: Payer: Self-pay | Admitting: Physician Assistant

## 2015-06-30 ENCOUNTER — Other Ambulatory Visit: Payer: Self-pay | Admitting: Physician Assistant

## 2015-07-07 NOTE — Telephone Encounter (Signed)
Spoke to patient's wife and informed her we have found the error and the claim has been resubmitted to the insurance for payment. She was informed they should wait the EOB for the new claim and she voiced understanding

## 2015-07-11 ENCOUNTER — Other Ambulatory Visit: Payer: Self-pay | Admitting: Physician Assistant

## 2015-07-11 NOTE — Telephone Encounter (Signed)
Refill sent per [facility] refill protocol/SLS  

## 2015-08-04 ENCOUNTER — Other Ambulatory Visit: Payer: Self-pay | Admitting: Physician Assistant

## 2015-08-04 DIAGNOSIS — M5440 Lumbago with sciatica, unspecified side: Secondary | ICD-10-CM

## 2015-08-04 MED ORDER — HYDROCODONE-ACETAMINOPHEN 5-325 MG PO TABS: 0.5000 | ORAL_TABLET | Freq: Four times a day (QID) | ORAL | Status: DC | PRN

## 2015-08-04 NOTE — Telephone Encounter (Signed)
Please advise on refill since covering for [facility].

## 2015-08-04 NOTE — Telephone Encounter (Signed)
Covering for [employer] PA-C and will prescribe 30 tabs since I am not regular provider. Last rx was for 90. He can get more from pcp.

## 2015-08-04 NOTE — Telephone Encounter (Signed)
Spoke with pt's wife and she state that he has enough of the hydrocodone and she would like to wait until patients pcp comes back so that he can get a full Rx. Rx printed by E. [facility] on 08/04/15. Will forward to pcp when he is back in the office.

## 2015-08-04 NOTE — Telephone Encounter (Signed)
°  Relationship to patient: Self  Can be reached: [phone]    Reason for call: Refill on HYDROcodone-acetaminophen (NORCO/VICODIN) 5-325 MG tablet

## 2015-08-05 DIAGNOSIS — Z79891 Long term (current) use of opiate analgesic: Secondary | ICD-10-CM | POA: Diagnosis not present

## 2015-08-05 MED ORDER — HYDROCODONE-ACETAMINOPHEN 5-325 MG PO TABS: 0.5000 | ORAL_TABLET | Freq: Four times a day (QID) | ORAL | Status: DC | PRN

## 2015-08-05 NOTE — Telephone Encounter (Signed)
Refill request for Hydrocodone-APAP Last filled by MD on - 06/28/15, #90x0 Last AEX - 12/07/14, CPE Last UDS - 01/19/15 Low Risk New Screen Today [patient informed]; also, New CSC update today Rx printed for signature of covering provider/SLS 06/30

## 2015-08-15 ENCOUNTER — Encounter: Payer: Self-pay | Admitting: Physician Assistant

## 2015-08-15 ENCOUNTER — Telehealth: Payer: Self-pay | Admitting: Physician Assistant

## 2015-08-15 ENCOUNTER — Ambulatory Visit (INDEPENDENT_AMBULATORY_CARE_PROVIDER_SITE_OTHER): Payer: Medicare Other | Admitting: Physician Assistant

## 2015-08-15 VITALS — BP 128/84 | HR 58 | Temp 97.7°F | Resp 16 | Ht 70.0 in | Wt 177.2 lb

## 2015-08-15 DIAGNOSIS — E785 Hyperlipidemia, unspecified: Secondary | ICD-10-CM | POA: Diagnosis not present

## 2015-08-15 DIAGNOSIS — B353 Tinea pedis: Secondary | ICD-10-CM

## 2015-08-15 DIAGNOSIS — K219 Gastro-esophageal reflux disease without esophagitis: Secondary | ICD-10-CM | POA: Diagnosis not present

## 2015-08-15 DIAGNOSIS — I10 Essential (primary) hypertension: Secondary | ICD-10-CM

## 2015-08-15 MED ORDER — CLOTRIMAZOLE-BETAMETHASONE 1-0.05 % EX CREA: 1.0000 "application " | TOPICAL_CREAM | Freq: Two times a day (BID) | CUTANEOUS | Status: DC

## 2015-08-15 NOTE — Progress Notes (Signed)
Patient presents to clinic today for follow-up of chronic medical issues. Patient also with acute concern today.  Patient c/o itching between toes of feet bilaterally x 6 months, worsening over the past 2 months. Denies numbness, tingling or swelling. Notes scaling of skin in-between toes. Has not tried anything for symptom relief.  Hypertension -- Is currently on regimen on Amlodipine 5 mg, Lotensin 20 mg Lopressor 50 mg BID and Imdur 20 mg QD. Endorses taking as directed. Patient denies chest pain, palpitations, lightheadedness, dizziness, vision changes or frequent headaches.  BP Readings from Last 3 Encounters:  08/15/15 128/84  04/01/15 130/64  03/23/15 132/85   Hyperlipidemia -- Is currently on Zocor 20 mg daily. Endorses taking as directed without myalgias. Is taking 81 mg ASA daily. Is not fasting for labs. Would like to return to lab draw later this week, fasting.  GERD -- Endorses doing very well on current regimen. Denies breakthrough symptoms.  Past Medical History  Diagnosis Date  . HLD (hyperlipidemia)   . Aortic stenosis     mild echocardiogram 8/09 EF 60%, showed no regional wall motion abnml, mild LVH, mod focal basal septal hypertrophy and mild dyastolic dysfunction. 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 mean gradient across aortic valve was 8 mmHG. also mild L atrial enlargement and normal RV size and function. minimal AS on LHC in 7/10.   Marland Kitchen HTN (hypertension)   . GERD (gastroesophageal reflux disease)   . BPH (benign prostatic hypertrophy)   . Diverticulosis   . CAD (coronary artery disease)     LCH (7/10) totally occluded proximal RCA with very robuse L to R collaterals, 50% proximal LAD stenosis, EF 65%, medical management.    . Tubular adenoma of colon 2009  . Smoker   . TIA (transient ischemic attack)     hx  . Low back pain     s/p surgical fusion  . Osteoarthritis   . Chicken pox   . Esophageal stricture     . Hiatal hernia     Current Outpatient Prescriptions on File Prior to Visit  Medication Sig Dispense Refill  . albuterol (PROVENTIL HFA;VENTOLIN HFA) 108 (90 BASE) MCG/ACT inhaler Inhale 1-2 puffs into the lungs every 6 (six) hours as needed for wheezing or shortness of breath. 3 Inhaler 1  . amLODipine (NORVASC) 5 MG tablet Take 1 tablet (5 mg total) by mouth daily. 90 tablet 1  . aspirin 81 MG EC tablet Take 81 mg by mouth daily.      . benazepril (LOTENSIN) 20 MG tablet TAKE 1 TABLET BY MOUTH DAILY 90 tablet 0  . buPROPion (WELLBUTRIN SR) 150 MG 12 hr tablet Take 1 tablet (150 mg total) by mouth as directed. Take 1 tablet for the first 3 days and then twice daily 180 tablet 3  . Cetirizine HCl 10 MG CAPS Take 1 capsule (10 mg total) by mouth daily as needed. 90 capsule 1  . cholecalciferol (VITAMIN D) 1000 UNITS tablet Take 1,000 Units by mouth as directed.     . fluticasone (FLONASE) 50 MCG/ACT nasal spray Place 2 sprays into both nostrils daily as needed. 48 g 1  . HYDROcodone-acetaminophen (NORCO/VICODIN) 5-325 MG tablet Take 0.5-1 tablets by mouth every 6 (six) hours as needed for moderate pain. 90 tablet 0  . isosorbide mononitrate (IMDUR) 30 MG 24 hr tablet TAKE 1 TABLET BY MOUTH AT BEDTIME 90 tablet 0  . loperamide (IMODIUM A-D) 2 MG  tablet Take 2 mg by mouth daily as needed for diarrhea or loose stools. Reported on 03/23/2015    . metoprolol (LOPRESSOR) 50 MG tablet TAKE 1 TABLET BY MOUTH TWICE DAILY 180 tablet 0  . nabumetone (RELAFEN) 500 MG tablet Take 1 tablet (500 mg total) by mouth daily. (Patient taking differently: Take 500 mg by mouth daily as needed. ) 30 tablet 0  . Omega-3 Fatty Acids (FISH OIL) 1000 MG CAPS Take 2 capsules by mouth 2 (two) times daily.      Marland Kitchen omeprazole (PRILOSEC) 20 MG capsule Take 1 capsule (20 mg total) by mouth daily. 30 capsule 11  . ranitidine (ZANTAC) 150 MG tablet Take 1 tablet (150 mg total) by mouth at bedtime. 90 tablet 1  . simvastatin  (ZOCOR) 20 MG tablet TAKE 1 TABLET(20 MG) BY MOUTH AT BEDTIME 30 tablet 5  . vitamin B-12 (CYANOCOBALAMIN) 1000 MCG tablet Take 1,000 mcg by mouth daily.       No current facility-administered medications on file prior to visit.    Allergies  Allergen Reactions  . Celecoxib     REACTION: hives and itching    Family History  Problem Relation Age of Onset  . Alzheimer's disease Mother 33    Deceased  . Arthritis/Rheumatoid Mother   . Heart disease Father 72    Living  . Coronary artery disease Father     CABG  . Stomach cancer Maternal Uncle   . Brain cancer Maternal Aunt     x2  . Arthritis Brother     DJD  . Obesity Daughter     Had Bypass Sx    Social History   Social History  . Marital Status: Married    Spouse Name: etta  . Number of Children: 2  . Years of Education: N/A   Occupational History  . retired     disabled due to back problems   Social History Main Topics  . Smoking status: Former Smoker -- 1.50 packs/day for 50 years    Types: Cigarettes    Quit date: 04/06/2011  . Smokeless tobacco: Former Systems developer     Comment: smoked less than 1 ppd for 40+ years; Using Administrator, sports  . Alcohol Use: No     Comment: social   . Drug Use: No  . Sexual Activity: Not Asked   Other Topics Concern  . None   Social History Narrative   Married (3rd), [place]. 2 children from 1st marriage, 4 step children.    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       Cell # [id number]   Review of Systems - See HPI.  All other ROS are negative.  BP 128/84 mmHg  Pulse 58  Temp(Src) 97.7 F (36.5 C) (Oral)  Resp 16  Ht 5\' 10"  (1.778 m)  Wt 177 lb 4 oz (80.4 kg)  BMI 25.43 kg/m2  SpO2 99%  Physical Exam  Constitutional: He is oriented to person, place, and time and well-developed, well-nourished, and in no distress.  HENT:  Head: Normocephalic and atraumatic.  Eyes: Conjunctivae are normal.   Neck: Neck supple.  Cardiovascular: Normal rate, regular rhythm, normal heart sounds and intact distal pulses.   Pulmonary/Chest: Effort normal and breath sounds normal. No respiratory distress. He has no wheezes. He has no rales. He exhibits no tenderness.  Neurological: He is alert and oriented to person, place, and  time.  Skin: Skin is warm and dry.     Vitals reviewed.   No results found for this or any previous visit (from the past 2160 hour(s)).  Assessment/Plan: 1. Tinea pedis of both feet Rx ketoconazole cream to apply as directed. Supportive measures and hygiene practices reviewed. FU if not resolving.  2. Essential hypertension BP stable. Will continue current regimen. FU 6 months.  3. Gastroesophageal reflux disease, esophagitis presence not specified Doing very well. Continue current regimen and diet.   4. Hyperlipidemia Tolerating statin well. Will return for fasting lipid panel and LFTs.   Leeanne Rio, PA-C

## 2015-08-15 NOTE — Telephone Encounter (Signed)
Caller name: Braelen L Relation to pt: self  Call back [place]:  Reason for call: Pt came in office for his appt today (08-15-15) and brought in copies of bills that he received. Pt states one of the bill with 157.66 was taken care of but the one with 40.00 is being charged as a copay, pt states does not have copay at all with [employer] and would like to have this bill as well taken care of from his last appt. Copies at front desk. Please advise.

## 2015-08-15 NOTE — Patient Instructions (Signed)
Please schedule an appointment for fasting labs.  I will call with results.  Please use the lotrisone ointment as directed. Avoid wearing the same shoes each day -- let them air out.  Always wear breathable socks (cotton or similar fiber). Keep feet clean and dry. Call or return to clinic if symptoms are not resolving.

## 2015-08-17 ENCOUNTER — Telehealth: Payer: Self-pay | Admitting: *Deleted

## 2015-08-17 ENCOUNTER — Other Ambulatory Visit: Payer: Medicare Other

## 2015-08-17 NOTE — Telephone Encounter (Signed)
PA initiated on [email or web address] Key: [id number] - Rx #[id number]  Awaiting determination

## 2015-08-18 ENCOUNTER — Other Ambulatory Visit: Payer: Self-pay | Admitting: Physician Assistant

## 2015-08-19 MED ORDER — KETOCONAZOLE 2 % EX CREA: 1.0000 "application " | TOPICAL_CREAM | Freq: Every day | CUTANEOUS | Status: DC

## 2015-08-19 NOTE — Addendum Note (Signed)
Addended by: Murtis Sink A on: 08/19/2015 11:51 AM   Modules accepted: Orders, Medications

## 2015-08-19 NOTE — Telephone Encounter (Signed)
E-scribed to pharmacy

## 2015-08-19 NOTE — Telephone Encounter (Signed)
PA denied Alternative: ketoconazole cream 2% Please advise

## 2015-08-19 NOTE — Telephone Encounter (Signed)
Ok to switch to ketoconazole topical. Apply once daily x 2 weeks.

## 2015-08-24 ENCOUNTER — Other Ambulatory Visit: Payer: Self-pay | Admitting: Physician Assistant

## 2015-08-24 NOTE — Telephone Encounter (Signed)
Rx request to pharmacy/SLS  

## 2015-08-24 NOTE — Telephone Encounter (Signed)
Talked to patient's wife and she states they talked to the insurance they have agreed to pay the outstanding balance

## 2015-09-02 ENCOUNTER — Other Ambulatory Visit (INDEPENDENT_AMBULATORY_CARE_PROVIDER_SITE_OTHER): Payer: Medicare Other

## 2015-09-02 DIAGNOSIS — E785 Hyperlipidemia, unspecified: Secondary | ICD-10-CM

## 2015-09-02 LAB — COMPREHENSIVE METABOLIC PANEL
ALT: 14 U/L (ref 0–53)
AST: 14 U/L (ref 0–37)
Albumin: 4 g/dL (ref 3.5–5.2)
Alkaline Phosphatase: 92 U/L (ref 39–117)
BUN: 10 mg/dL (ref 6–23)
CALCIUM: 9.2 mg/dL (ref 8.4–10.5)
CO2: 29 meq/L (ref 19–32)
Chloride: 104 mEq/L (ref 96–112)
Creatinine, Ser: 0.87 mg/dL (ref 0.40–1.50)
GFR: 91.44 mL/min (ref >60.00)
Glucose, Bld: 104 mg/dL — ABNORMAL HIGH (ref 70–99)
Potassium: 4 mEq/L (ref 3.5–5.1)
Sodium: 138 mEq/L (ref 135–145)
Total Bilirubin: 0.5 mg/dL (ref 0.2–1.2)
Total Protein: 6.8 g/dL (ref 6.0–8.3)

## 2015-09-02 LAB — LIPID PANEL
CHOL/HDL RATIO: 5
Cholesterol: 139 mg/dL (ref 0–200)
HDL: 27.9 mg/dL — AB (ref >39.00)
LDL CALC: 78 mg/dL (ref 0–99)
NonHDL: 111.59
TRIGLYCERIDES: 169 mg/dL — AB (ref 0.0–149.0)
VLDL: 33.8 mg/dL (ref 0.0–40.0)

## 2015-09-20 ENCOUNTER — Encounter: Payer: Self-pay | Admitting: Physician Assistant

## 2015-09-21 ENCOUNTER — Telehealth: Payer: Self-pay | Admitting: Physician Assistant

## 2015-09-21 DIAGNOSIS — M5440 Lumbago with sciatica, unspecified side: Secondary | ICD-10-CM

## 2015-09-21 MED ORDER — HYDROCODONE-ACETAMINOPHEN 5-325 MG PO TABS: 0.5000 | ORAL_TABLET | Freq: Four times a day (QID) | ORAL | 0 refills | Status: DC | PRN

## 2015-09-21 NOTE — Telephone Encounter (Signed)
Relation to [id number] Call back number:[phone] Pharmacy:  Reason for call:  Patient requesting a refill HYDROcodone-acetaminophen (NORCO/VICODIN) 5-325 MG tablet

## 2015-09-21 NOTE — Telephone Encounter (Signed)
Refill request for Hydrocodone-APAP Last filled by MD on - 08/05/15, #90x0 Last AEX - 08/15/15 Rx printed; pt informed, understood & agreed to p/u during regular business hours/SLS 08/16

## 2015-09-27 ENCOUNTER — Other Ambulatory Visit: Payer: Self-pay | Admitting: Physician Assistant

## 2015-09-28 NOTE — Telephone Encounter (Signed)
Request for refill on: Last Rx: Last OV: Return: Please advise on refills/SLS Rx sent to pharmacy per [facility] refill protocol/SLS

## 2015-10-10 ENCOUNTER — Other Ambulatory Visit: Payer: Self-pay | Admitting: Physician Assistant

## 2015-11-01 ENCOUNTER — Telehealth: Payer: Self-pay | Admitting: Physician Assistant

## 2015-11-01 DIAGNOSIS — M5440 Lumbago with sciatica, unspecified side: Secondary | ICD-10-CM

## 2015-11-01 MED ORDER — HYDROCODONE-ACETAMINOPHEN 5-325 MG PO TABS: 0.5000 | ORAL_TABLET | Freq: Four times a day (QID) | ORAL | 0 refills | Status: DC | PRN

## 2015-11-01 NOTE — Telephone Encounter (Signed)
Caller informed, understood & agreed/SLS 09/26

## 2015-11-01 NOTE — Telephone Encounter (Signed)
Spouse [name]) [phone]  Refill request for HYDROcodone

## 2015-11-04 ENCOUNTER — Ambulatory Visit (INDEPENDENT_AMBULATORY_CARE_PROVIDER_SITE_OTHER): Payer: Medicare Other | Admitting: *Deleted

## 2015-11-04 DIAGNOSIS — Z23 Encounter for immunization: Secondary | ICD-10-CM | POA: Diagnosis not present

## 2015-11-16 ENCOUNTER — Other Ambulatory Visit: Payer: Self-pay | Admitting: Physician Assistant

## 2015-11-16 NOTE — Telephone Encounter (Signed)
Rx request to pharmacy/SLS  

## 2015-11-17 ENCOUNTER — Other Ambulatory Visit: Payer: Self-pay | Admitting: Cardiology

## 2015-11-17 MED ORDER — AMLODIPINE BESYLATE 5 MG PO TABS: 5.0000 mg | ORAL_TABLET | Freq: Every day | ORAL | 1 refills | Status: DC

## 2015-12-07 ENCOUNTER — Other Ambulatory Visit: Payer: Self-pay | Admitting: Physician Assistant

## 2015-12-07 ENCOUNTER — Other Ambulatory Visit: Payer: Self-pay

## 2015-12-07 MED ORDER — HYDROCODONE-ACETAMINOPHEN 5-325 MG PO TABS: 0.5000 | ORAL_TABLET | Freq: Four times a day (QID) | ORAL | 0 refills | Status: DC | PRN

## 2015-12-07 NOTE — Telephone Encounter (Signed)
Routed refill request to [facility]. [facility]ell Done for approval/disapproval.

## 2015-12-07 NOTE — Telephone Encounter (Signed)
Spouse called in to request a refill request HYDROcodone    [phone]

## 2015-12-07 NOTE — Telephone Encounter (Signed)
Refill has been granted. Rx printed, signed and placed at front desk for pickup. Please call patient for pickup.

## 2015-12-20 ENCOUNTER — Telehealth: Payer: Self-pay | Admitting: Physician Assistant

## 2015-12-20 NOTE — Telephone Encounter (Signed)
Patient would like to transfer from [place] to [place], please advise

## 2015-12-20 NOTE — Telephone Encounter (Signed)
Patient came in office and is going to stay with Elyn Aquas, PA

## 2015-12-20 NOTE — Telephone Encounter (Signed)
Ok with me 

## 2016-01-09 ENCOUNTER — Other Ambulatory Visit: Payer: Self-pay | Admitting: Physician Assistant

## 2016-01-09 NOTE — Telephone Encounter (Signed)
Refill sent per [facility] refill protocol/SLS  

## 2016-01-13 ENCOUNTER — Other Ambulatory Visit: Payer: Self-pay | Admitting: Physician Assistant

## 2016-01-20 ENCOUNTER — Telehealth: Payer: Self-pay | Admitting: Physician Assistant

## 2016-01-20 MED ORDER — HYDROCODONE-ACETAMINOPHEN 5-325 MG PO TABS: 0.5000 | ORAL_TABLET | Freq: Four times a day (QID) | ORAL | 0 refills | Status: DC | PRN

## 2016-01-20 NOTE — Telephone Encounter (Signed)
Will grant refill.  [place] database reviewed today -- no red flags.  UDS up-to-date. Low risk.   [place] for patient to pick up Rx.

## 2016-01-20 NOTE — Telephone Encounter (Signed)
Advised patient spouse [name] per DPR rx is ready up front for pick up. She states they will pick up on Monday.

## 2016-01-20 NOTE — Telephone Encounter (Signed)
Indication for chronic opioid: Low back pain syndrome Medication and dose: Hydrocodone-APAP 5/325 # pills per month: #90 0RF on 12/07/15 Last UDS date: 08/05/15 Pain contract signed (Y/N): Yes 08/05/15 Date narcotic database last reviewed (include red flags):   Please advise

## 2016-01-20 NOTE — Telephone Encounter (Signed)
Pt needs needs refill on HYDROcodone, they will pick up on Monday

## 2016-01-25 ENCOUNTER — Ambulatory Visit (INDEPENDENT_AMBULATORY_CARE_PROVIDER_SITE_OTHER): Payer: Medicare Other | Admitting: Physician Assistant

## 2016-01-25 ENCOUNTER — Encounter: Payer: Self-pay | Admitting: Physician Assistant

## 2016-01-25 VITALS — BP 116/62 | HR 67 | Temp 98.9°F | Resp 16 | Ht 70.0 in | Wt 179.0 lb

## 2016-01-25 DIAGNOSIS — B9789 Other viral agents as the cause of diseases classified elsewhere: Secondary | ICD-10-CM

## 2016-01-25 DIAGNOSIS — J069 Acute upper respiratory infection, unspecified: Secondary | ICD-10-CM

## 2016-01-25 MED ORDER — ALBUTEROL SULFATE HFA 108 (90 BASE) MCG/ACT IN AERS: 1.0000 | INHALATION_SPRAY | Freq: Four times a day (QID) | RESPIRATORY_TRACT | 1 refills | Status: DC | PRN

## 2016-01-25 MED ORDER — PREDNISONE 20 MG PO TABS: ORAL_TABLET | ORAL | 0 refills | Status: DC

## 2016-01-25 NOTE — Progress Notes (Signed)
Pre visit review using our clinic review tool, if applicable. No additional management support is needed unless otherwise documented below in the visit note. 

## 2016-01-25 NOTE — Patient Instructions (Signed)
Please stay well hydrated and get plenty of rest. Use the albuterol inhaler as directed. Take prednisone as directed. Use Mucinex (Plain) for congestion.   Follow-up if symptoms are not improving.

## 2016-01-25 NOTE — Progress Notes (Signed)
Patient presents to clinic today c/o 4 days of dry cough with chest tightness. Notes some sinus pressure without pain. Denies ear pain/tooth pain. Denies fever but notes some initial chills on day 1-2 of symptoms. Denies aches. Denies recent travel. Was taking care of father a week or so ago who had similar symptoms.    Past Medical History:  Diagnosis Date  . Aortic stenosis    mild echocardiogram 8/09 EF 60%, showed no regional wall motion abnml, mild LVH, mod focal basal septal hypertrophy and mild dyastolic dysfunction. 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 mean gradient across aortic valve was 8 mmHG. also mild L atrial enlargement and normal RV size and function. minimal AS on LHC in 7/10.   Marland Kitchen BPH (benign prostatic hypertrophy)   . CAD (coronary artery disease)    LCH (7/10) totally occluded proximal RCA with very robuse L to R collaterals, 50% proximal LAD stenosis, EF 65%, medical management.    . Chicken pox   . Diverticulosis   . Esophageal stricture   . GERD (gastroesophageal reflux disease)   . Hiatal hernia   . HLD (hyperlipidemia)   . HTN (hypertension)   . Low back pain    s/p surgical fusion  . Osteoarthritis   . Smoker   . TIA (transient ischemic attack)    hx  . Tubular adenoma of colon 2009    Current Outpatient Prescriptions on File Prior to Visit  Medication Sig Dispense Refill  . amLODipine (NORVASC) 5 MG tablet Take 1 tablet (5 mg total) by mouth daily. 90 tablet 1  . aspirin 81 MG EC tablet Take 81 mg by mouth daily.      . benazepril (LOTENSIN) 20 MG tablet TAKE 1 TABLET BY MOUTH DAILY 90 tablet 1  . buPROPion (WELLBUTRIN SR) 150 MG 12 hr tablet Take 1 tablet (150 mg total) by mouth as directed. Take 1 tablet for the first 3 days and then twice daily 180 tablet 3  . cholecalciferol (VITAMIN D) 1000 UNITS tablet Take 1,000 Units by mouth as directed.     . fluticasone (FLONASE) 50 MCG/ACT nasal spray Place 2  sprays into both nostrils daily as needed. 48 g 1  . HYDROcodone-acetaminophen (NORCO/VICODIN) 5-325 MG tablet Take 0.5-1 tablets by mouth every 6 (six) hours as needed for moderate pain. 90 tablet 0  . isosorbide mononitrate (IMDUR) 30 MG 24 hr tablet TAKE 1 TABLET BY MOUTH AT BEDTIME 90 tablet 0  . loperamide (IMODIUM A-D) 2 MG tablet Take 2 mg by mouth daily as needed for diarrhea or loose stools. Reported on 03/23/2015    . metoprolol (LOPRESSOR) 50 MG tablet TAKE 1 TABLET BY MOUTH TWICE DAILY 180 tablet 0  . Omega-3 Fatty Acids (FISH OIL) 1000 MG CAPS Take 2 capsules by mouth 2 (two) times daily.      Marland Kitchen omeprazole (PRILOSEC) 20 MG capsule Take 1 capsule (20 mg total) by mouth daily. 30 capsule 11  . vitamin B-12 (CYANOCOBALAMIN) 1000 MCG tablet Take 1,000 mcg by mouth daily.       No current facility-administered medications on file prior to visit.     Allergies  Allergen Reactions  . Celecoxib     REACTION: hives and itching    Family History  Problem Relation Age of Onset  . Alzheimer's disease Mother 40    Deceased  . Arthritis/Rheumatoid Mother   . Heart disease Father 36    Living  .  Coronary artery disease Father     CABG  . Stomach cancer Maternal Uncle   . Brain cancer Maternal Aunt     x2  . Arthritis Brother     DJD  . Obesity Daughter     Had Bypass Sx    Social History   Social History  . Marital status: Married    Spouse name: etta  . Number of children: 2  . Years of education: N/A   Occupational History  . retired Retired    disabled due to back problems   Social History Main Topics  . Smoking status: Former Smoker    Packs/day: 1.50    Years: 50.00    Types: Cigarettes    Quit date: 04/06/2011  . Smokeless tobacco: Former Systems developer     Comment: smoked less than 1 ppd for 40+ years; Using Administrator, sports  . Alcohol use No     Comment: social   . Drug use: No  . Sexual activity: Not Asked   Other Topics Concern  . None   Social History Narrative    Married (3rd), [place]. 2 children from 1st marriage, 4 step children.    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       Cell # [id number]   Review of Systems - See HPI.  All other ROS are negative.  BP 116/62   Pulse 67   Temp 98.9 F (37.2 C) (Oral)   Resp 16   Ht 5\' 10"  (1.778 m)   Wt 179 lb (81.2 kg)   SpO2 94%   BMI 25.68 kg/m   Physical Exam  Constitutional: He is oriented to person, place, and time and well-developed, well-nourished, and in no distress.  HENT:  Head: Normocephalic and atraumatic.  Right Ear: Tympanic membrane is not erythematous, not retracted and not bulging. A middle ear effusion is present.  Left Ear: Tympanic membrane is not erythematous, not retracted and not bulging. A middle ear effusion is present.  Nose: Nose normal. Right sinus exhibits no maxillary sinus tenderness and no frontal sinus tenderness. Left sinus exhibits no maxillary sinus tenderness and no frontal sinus tenderness.  Mouth/Throat: Uvula is midline, oropharynx is clear and moist and mucous membranes are normal.  Eyes: Conjunctivae are normal.  Neck: Neck supple.  Cardiovascular: Normal rate, regular rhythm, normal heart sounds and intact distal pulses.   Pulmonary/Chest: Effort normal. No respiratory distress. He has wheezes. He has no rales. He exhibits no tenderness.  Lymphadenopathy:    He has no cervical adenopathy.  Neurological: He is alert and oriented to person, place, and time.  Skin: Skin is warm and dry. No rash noted.  Psychiatric: Affect normal.  Vitals reviewed.  Assessment/Plan: 1. Viral URI With mild wheeze. Exam otherwise unremarkable. Albuterol neb given x 1 with resolution of wheeze. Rx Prednisone 3-day burst. Rx Albuterol. Supportive measures and OTC medications reviewed. FU if not resolving.     Leeanne Rio, PA-C

## 2016-02-09 ENCOUNTER — Ambulatory Visit: Payer: Medicare Other | Admitting: Family Medicine

## 2016-02-14 NOTE — Progress Notes (Signed)
Subjective:   Brandon [name] is a 76 y.o. male who presents for Medicare Annual/Subsequent preventive examination.  The Patient was informed that the wellness visit is to identify future health risk and educate and initiate measures that can reduce risk for increased disease through the lifespan.   Review of Systems:  No ROS.  Medicare Wellness Visit.  Cardiac Risk Factors include: advanced age (>72men, >51 women);dyslipidemia;male gender;hypertension   Sleep patterns: sleeps 7-8 hours. Up to void x 2.  Home Safety/Smoke Alarms: Smoke detectors and security in place.  Living environment; residence and Firearm Safety: Lives with wife in 2 story home, no problems with steps. Firearms locked away.  Seat Belt Safety/Bike Helmet: Wears seat belt.   Counseling:   Eye Exam-Last exam 2016, yearly by Dr. Prudencio Burly. Will make appt.  Dental-Full dentures. Gum care discussed.   Male:   CCS-colonoscopy 03/23/2015, benign. Recall 5 years.    PSA-01/27/2013, 4.950. Followed by Urology, Dr. Risa Grill. Appt scheduled for this Spring.       Objective:    Vitals: BP 122/60 (BP Location: Left Arm, Patient Position: Sitting, Cuff Size: Normal)   Pulse 78   Temp 98.2 F (36.8 C)   Resp 18   Ht 5\' 10"  (1.778 m)   Wt 176 lb (79.8 kg)   SpO2 95%   BMI 25.25 kg/m   Body mass index is 25.25 kg/m.  Tobacco History  Smoking Status  . Former Smoker  . Packs/day: 1.50  . Years: 50.00  . Types: Cigarettes  . Quit date: 04/06/2011  Smokeless Tobacco  . Former Systems developer    Comment: smoked less than 1 ppd for 40+ years; Using Administrator, sports     Counseling given: Not Answered   Past Medical History:  Diagnosis Date  . Aortic stenosis    mild echocardiogram 8/09 EF 60%, showed no regional wall motion abnml, mild LVH, mod focal basal septal hypertrophy and mild dyastolic dysfunction. 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 mean gradient across aortic valve was 8 mmHG.  also mild L atrial enlargement and normal RV size and function. minimal AS on LHC in 7/10.   Marland Kitchen BPH (benign prostatic hypertrophy)   . CAD (coronary artery disease)    LCH (7/10) totally occluded proximal RCA with very robuse L to R collaterals, 50% proximal LAD stenosis, EF 65%, medical management.    . Chicken pox   . Diverticulosis   . Esophageal stricture   . GERD (gastroesophageal reflux disease)   . Hiatal hernia   . HLD (hyperlipidemia)   . HTN (hypertension)   . Low back pain    s/p surgical fusion  . Osteoarthritis   . Smoker   . TIA (transient ischemic attack)    hx  . Tubular adenoma of colon 2009   Past Surgical History:  Procedure Laterality Date  . cataract surgery  01/2012/02/2012   both eyes/lens implanted  . CHOLECYSTECTOMY  1994  . L4-5 discectomy  4/98   Dr. Sherwood Gambler   . lumbar decompression and Ray cage  8/98   L4-5 Dr. Rita Ohara  . LUMBAR LAMINECTOMY  9/04   and microdiscectomy. L3-4. Dr. Sherwood Gambler  . MOUTH SURGERY     FULL DENTURES   Family History  Problem Relation Age of Onset  . Heart disease Father 49    Living  . Coronary artery disease Father     CABG  . Alzheimer's disease Mother 22    Deceased  .  Arthritis/Rheumatoid Mother   . Stomach cancer Maternal Uncle   . Brain cancer Maternal Aunt     x2  . Arthritis Brother     DJD  . Obesity Daughter     Had Bypass Sx   History  Sexual Activity  . Sexual activity: Not on file    Outpatient Encounter Prescriptions as of 02/15/2016  Medication Sig  . amLODipine (NORVASC) 5 MG tablet Take 1 tablet (5 mg total) by mouth daily.  Marland Kitchen aspirin 81 MG EC tablet Take 81 mg by mouth daily.    Marland Kitchen atorvastatin (LIPITOR) 40 MG tablet Take 40 mg by mouth daily.  . benazepril (LOTENSIN) 20 MG tablet TAKE 1 TABLET BY MOUTH DAILY  . buPROPion (WELLBUTRIN SR) 150 MG 12 hr tablet Take 1 tablet (150 mg total) by mouth as directed. Take 1 tablet for the first 3 days and then twice daily  . cholecalciferol (VITAMIN  D) 1000 UNITS tablet Take 1,000 Units by mouth as directed.   . fluticasone (FLONASE) 50 MCG/ACT nasal spray Place 2 sprays into both nostrils daily as needed.  Marland Kitchen HYDROcodone-acetaminophen (NORCO/VICODIN) 5-325 MG tablet Take 0.5-1 tablets by mouth every 6 (six) hours as needed for moderate pain.  . isosorbide mononitrate (IMDUR) 30 MG 24 hr tablet TAKE 1 TABLET BY MOUTH AT BEDTIME  . loperamide (IMODIUM A-D) 2 MG tablet Take 2 mg by mouth daily as needed for diarrhea or loose stools. Reported on 03/23/2015  . metoprolol (LOPRESSOR) 50 MG tablet TAKE 1 TABLET BY MOUTH TWICE DAILY  . Omega-3 Fatty Acids (FISH OIL) 1000 MG CAPS Take 2 capsules by mouth 2 (two) times daily.    Marland Kitchen omeprazole (PRILOSEC) 20 MG capsule Take 1 capsule (20 mg total) by mouth daily.  . vitamin B-12 (CYANOCOBALAMIN) 1000 MCG tablet Take 1,000 mcg by mouth daily.    Marland Kitchen albuterol (PROVENTIL HFA;VENTOLIN HFA) 108 (90 Base) MCG/ACT inhaler Inhale 1-2 puffs into the lungs every 6 (six) hours as needed for wheezing or shortness of breath. (Patient not taking: Reported on 02/15/2016)  . predniSONE (DELTASONE) 20 MG tablet Take 2 tablets by mouth daily for 3 days (Patient not taking: Reported on 02/15/2016)   No facility-administered encounter medications on file as of 02/15/2016.     Activities of Daily Living In your present state of health, do you have any difficulty performing the following activities: 02/15/2016 01/25/2016  Hearing? Y N  Vision? N N  Difficulty concentrating or making decisions? N N  Walking or climbing stairs? N N  Dressing or bathing? N N  Doing errands, shopping? N N  Preparing Food and eating ? N -  Using the Toilet? N -  In the past six months, have you accidently leaked urine? N -  Do you have problems with loss of bowel control? N -  Managing your Medications? N -  Managing your Finances? N -  Housekeeping or managing your Housekeeping? N -  Some recent data might be hidden    Patient Care  Team: Brunetta Jeans, PA-C as PCP - General (Physician Assistant) Rana Snare, MD as Consulting Physician (Urology) Ladene Artist, MD as Consulting Physician (Gastroenterology) Larey Dresser, MD as Consulting Physician (Cardiology)   Assessment:    Physical assessment deferred to PCP.  Exercise Activities and Dietary recommendations Current Exercise Habits: Home exercise routine (Stays active during the day. 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 ), Frequency (Times/Week): 4, Exercise limited by: None identified   Diet (meal preparation, eat out,  water intake, caffeinated beverages, dairy products, fruits and vegetables): Drink sweet tea, water and coffee.   Breakfast: cereal Lunch: sandwich, cheese, apples, vegetable stew, pb crackers Dinner: cafeteria meal (j&s, k&w), meat, vegetables.   Encouraged heart healthy diet and increasing water intake. Discussed maintaining activity level including during cold weather seasons.   Goals      Patient Stated   . <enter goal here> (pt-stated)          Maintains current health and activity level.       Fall Risk Fall Risk  02/15/2016 01/25/2016 12/07/2014 12/07/2014 05/19/2012  Falls in the past year? Yes No No No No  Number falls in past yr: 1 - - - -  Injury with Fall? No - - - -  Follow up Falls prevention discussed - - - -   Depression Screen PHQ 2/9 Scores 02/15/2016 01/25/2016 12/07/2014 05/19/2012  PHQ - 2 Score 0 0 0 0  PHQ- 9 Score - 0 - -    Cognitive Function       Ad8 score reviewed for issues:  Issues making decisions: no  Less interest in hobbies / activities: no  Repeats questions, stories (family complaining): no  Trouble using ordinary gadgets (microwave, computer, phone): no  Forgets the month or year: no  Mismanaging finances: no  Remembering appts:no  Daily problems with thinking and/or memory: no Ad8 score is=0     Immunization History  Administered Date(s) Administered  . Influenza Split 11/26/2010,  10/11/2011, 11/03/2012  . Influenza Whole 11/14/2006, 12/04/2007, 10/06/2008, 10/25/2009  . Influenza, Seasonal, Injecte, Preservative Fre 12/07/2014  . Influenza,inj,Quad PF,36+ Mos 11/04/2015  . Influenza-Unspecified 11/19/2013  . Pneumococcal Conjugate-13 12/07/2014   Screening Tests Health Maintenance  Topic Date Due  . DTaP/Tdap/Td (1 - Tdap) 02/14/2017 (Originally 10/03/1959)  . ZOSTAVAX  02/14/2017 (Originally 10/04/2002)  . TETANUS/TDAP  02/14/2017 (Originally 10/03/1959)  . PNA vac Low Risk Adult (2 of 2 - PPSV23) 02/14/2017 (Originally 12/07/2015)  . COLONOSCOPY  03/22/2020  . INFLUENZA VACCINE  Addressed      Plan:     Eat heart healthy diet (full of fruits, vegetables, whole grains, lean protein, water--limit salt, fat, and sugar intake) and increase physical activity as tolerated.  Continue doing brain stimulating activities (puzzles, reading, adult coloring books, staying active) to keep memory sharp.   Bring a copy of your advance directives to your next office visit.   During the course of the visit the patient was educated and counseled about the following appropriate screening and preventive services:   Vaccines to include Pneumoccal, Influenza, Hepatitis B, Td, Zostavax, HCV  Cardiovascular Disease  Colorectal cancer screening  Diabetes screening  Prostate Cancer Screening  Glaucoma screening  Nutrition counseling    Patient Instructions (the written plan) was given to the patient.    Gerilyn Nestle, RN  02/15/2016

## 2016-02-14 NOTE — Progress Notes (Signed)
Pre visit review using our clinic review tool, if applicable. No additional management support is needed unless otherwise documented below in the visit note. 

## 2016-02-15 ENCOUNTER — Ambulatory Visit (INDEPENDENT_AMBULATORY_CARE_PROVIDER_SITE_OTHER): Payer: Medicare Other | Admitting: Physician Assistant

## 2016-02-15 ENCOUNTER — Encounter: Payer: Self-pay | Admitting: Physician Assistant

## 2016-02-15 VITALS — BP 122/60 | HR 78 | Temp 98.2°F | Resp 18 | Ht 70.0 in | Wt 176.0 lb

## 2016-02-15 DIAGNOSIS — E784 Other hyperlipidemia: Secondary | ICD-10-CM | POA: Diagnosis not present

## 2016-02-15 DIAGNOSIS — I10 Essential (primary) hypertension: Secondary | ICD-10-CM

## 2016-02-15 DIAGNOSIS — E7849 Other hyperlipidemia: Secondary | ICD-10-CM

## 2016-02-15 DIAGNOSIS — Z0000 Encounter for general adult medical examination without abnormal findings: Secondary | ICD-10-CM

## 2016-02-15 LAB — CBC WITH DIFFERENTIAL/PLATELET
BASOS ABS: 0 10*3/uL (ref 0.0–0.1)
Basophils Relative: 0.5 % (ref 0.0–3.0)
EOS ABS: 0.1 10*3/uL (ref 0.0–0.7)
Eosinophils Relative: 2 % (ref 0.0–5.0)
HEMATOCRIT: 42.8 % (ref 39.0–52.0)
Hemoglobin: 14.3 g/dL (ref 13.0–17.0)
LYMPHS ABS: 2 10*3/uL (ref 0.7–4.0)
LYMPHS PCT: 28.5 % (ref 12.0–46.0)
MCHC: 33.4 g/dL (ref 30.0–36.0)
MCV: 83.4 fl (ref 78.0–100.0)
MONO ABS: 0.4 10*3/uL (ref 0.1–1.0)
Monocytes Relative: 5.4 % (ref 3.0–12.0)
NEUTROS ABS: 4.5 10*3/uL (ref 1.4–7.7)
NEUTROS PCT: 63.6 % (ref 43.0–77.0)
PLATELETS: 221 10*3/uL (ref 150.0–400.0)
RBC: 5.13 Mil/uL (ref 4.22–5.81)
RDW: 13.8 % (ref 11.5–15.5)
WBC: 7.1 10*3/uL (ref 4.0–10.5)

## 2016-02-15 LAB — COMPREHENSIVE METABOLIC PANEL
ALT: 11 U/L (ref 0–53)
AST: 11 U/L (ref 0–37)
Albumin: 4 g/dL (ref 3.5–5.2)
Alkaline Phosphatase: 125 U/L — ABNORMAL HIGH (ref 39–117)
BILIRUBIN TOTAL: 0.6 mg/dL (ref 0.2–1.2)
BUN: 7 mg/dL (ref 6–23)
CO2: 29 meq/L (ref 19–32)
CREATININE: 0.72 mg/dL (ref 0.40–1.50)
Calcium: 9.3 mg/dL (ref 8.4–10.5)
Chloride: 104 mEq/L (ref 96–112)
GFR: 113.62 mL/min (ref >60.00)
Glucose, Bld: 98 mg/dL (ref 70–99)
Potassium: 4.7 mEq/L (ref 3.5–5.1)
SODIUM: 142 meq/L (ref 135–145)
Total Protein: 6.8 g/dL (ref 6.0–8.3)

## 2016-02-15 LAB — URINALYSIS, ROUTINE W REFLEX MICROSCOPIC
BILIRUBIN URINE: NEGATIVE
Hgb urine dipstick: NEGATIVE
Ketones, ur: NEGATIVE
Leukocytes, UA: NEGATIVE
NITRITE: NEGATIVE
SPECIFIC GRAVITY, URINE: 1.015 (ref 1.000–1.030)
Total Protein, Urine: NEGATIVE
Urine Glucose: NEGATIVE
Urobilinogen, UA: 0.2 (ref 0.0–1.0)
pH: 5.5 (ref 5.0–8.0)

## 2016-02-15 LAB — TSH: TSH: 0.7 u[IU]/mL (ref 0.35–4.50)

## 2016-02-15 LAB — HEMOGLOBIN A1C: Hgb A1c MFr Bld: 5.9 % (ref 4.6–6.5)

## 2016-02-15 LAB — LIPID PANEL
CHOL/HDL RATIO: 4
Cholesterol: 124 mg/dL (ref 0–200)
HDL: 31.9 mg/dL — ABNORMAL LOW (ref >39.00)
LDL Cholesterol: 72 mg/dL (ref 0–99)
NONHDL: 91.94
Triglycerides: 102 mg/dL (ref 0.0–149.0)
VLDL: 20.4 mg/dL (ref 0.0–40.0)

## 2016-02-15 NOTE — Progress Notes (Signed)
Patient presents to clinic today for annual exam.  Patient is fasting for labs. Patient to have [place] with RN later today.  Chronic Issues: Hypertension -- Followed by Cardiology due to history of CAD and stroke. Is on multi drug regimen including Benazepril 20 mg, Amlodipine 5 mg, Imdur 30 mg daily and Lopressor 50 mg. Endorses taking medications as directed. Last visit with Cardiology [facility]) in 2/17. Patient denies chest pain, palpitations, lightheadedness, dizziness, vision changes or frequent headaches.  BP Readings from Last 3 Encounters:  02/15/16 122/60  01/25/16 116/62  08/15/15 128/84   Hyperlipidemia -- Currently on Atorvastatin 40 mg daily and 81 mg ASA daily.   Past Medical History:  Diagnosis Date  . Aortic stenosis    mild echocardiogram 8/09 EF 60%, showed no regional wall motion abnml, mild LVH, mod focal basal septal hypertrophy and mild dyastolic dysfunction. 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 mean gradient across aortic valve was 8 mmHG. also mild L atrial enlargement and normal RV size and function. minimal AS on LHC in 7/10.   Marland Kitchen BPH (benign prostatic hypertrophy)   . CAD (coronary artery disease)    LCH (7/10) totally occluded proximal RCA with very robuse L to R collaterals, 50% proximal LAD stenosis, EF 65%, medical management.    . Chicken pox   . Diverticulosis   . Esophageal stricture   . GERD (gastroesophageal reflux disease)   . Hiatal hernia   . HLD (hyperlipidemia)   . HTN (hypertension)   . Low back pain    s/p surgical fusion  . Osteoarthritis   . Smoker   . TIA (transient ischemic attack)    hx  . Tubular adenoma of colon 2009    Past Surgical History:  Procedure Laterality Date  . cataract surgery  01/2012/02/2012   both eyes/lens implanted  . CHOLECYSTECTOMY  1994  . L4-5 discectomy  4/98   Dr. Sherwood Gambler   . lumbar decompression and Ray cage  8/98   L4-5 Dr. Rita Ohara  . LUMBAR  LAMINECTOMY  9/04   and microdiscectomy. L3-4. Dr. Sherwood Gambler  . MOUTH SURGERY     FULL DENTURES    Current Outpatient Prescriptions on File Prior to Visit  Medication Sig Dispense Refill  . amLODipine (NORVASC) 5 MG tablet Take 1 tablet (5 mg total) by mouth daily. 90 tablet 1  . aspirin 81 MG EC tablet Take 81 mg by mouth daily.      Marland Kitchen atorvastatin (LIPITOR) 40 MG tablet Take 40 mg by mouth daily.    . benazepril (LOTENSIN) 20 MG tablet TAKE 1 TABLET BY MOUTH DAILY 90 tablet 1  . cholecalciferol (VITAMIN D) 1000 UNITS tablet Take 1,000 Units by mouth as directed.     . fluticasone (FLONASE) 50 MCG/ACT nasal spray Place 2 sprays into both nostrils daily as needed. 48 g 1  . HYDROcodone-acetaminophen (NORCO/VICODIN) 5-325 MG tablet Take 0.5-1 tablets by mouth every 6 (six) hours as needed for moderate pain. 90 tablet 0  . isosorbide mononitrate (IMDUR) 30 MG 24 hr tablet TAKE 1 TABLET BY MOUTH AT BEDTIME 90 tablet 0  . metoprolol (LOPRESSOR) 50 MG tablet TAKE 1 TABLET BY MOUTH TWICE DAILY 180 tablet 0  . Omega-3 Fatty Acids (FISH OIL) 1000 MG CAPS Take 2 capsules by mouth 2 (two) times daily.      Marland Kitchen omeprazole (PRILOSEC) 20 MG capsule Take 1 capsule (20 mg total) by mouth daily. 30 capsule  11  . vitamin B-12 (CYANOCOBALAMIN) 1000 MCG tablet Take 1,000 mcg by mouth daily.       No current facility-administered medications on file prior to visit.     Allergies  Allergen Reactions  . Celecoxib     REACTION: hives and itching    Family History  Problem Relation Age of Onset  . Heart disease Father 77    Living  . Coronary artery disease Father     CABG  . Alzheimer's disease Mother 58    Deceased  . Arthritis/Rheumatoid Mother   . Stomach cancer Maternal Uncle   . Brain cancer Maternal Aunt     x2  . Arthritis Brother     DJD  . Obesity Daughter     Had Bypass Sx    Social History   Social History  . Marital status: Married    Spouse name: etta  . Number of children: 2    . Years of education: N/A   Occupational History  . retired Retired    disabled due to back problems   Social History Main Topics  . Smoking status: Former Smoker    Packs/day: 1.50    Years: 50.00    Types: Cigarettes    Quit date: 06/2015  . Smokeless tobacco: Former Systems developer     Comment: smoked less than 1 ppd for 40+ years; Using Administrator, sports  . Alcohol use No     Comment: social   . Drug use: No  . Sexual activity: Not on file   Other Topics Concern  . Not on file   Social History Narrative   Married (3rd), [place]. 2 children from 1st marriage, 4 step children.    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       Cell # [id number]    Review of Systems  Constitutional: Negative for fever and weight loss.  HENT: Negative for ear discharge, ear pain, hearing loss and tinnitus.   Eyes: Negative for blurred vision, double vision, photophobia and pain.  Respiratory: Negative for cough and shortness of breath.   Cardiovascular: Negative for chest pain and palpitations.  Gastrointestinal: Negative for abdominal pain, blood in stool, constipation, diarrhea, heartburn, melena, nausea and vomiting.  Genitourinary: Negative for dysuria, flank pain, frequency, hematuria and urgency.  Musculoskeletal: Negative for falls.  Neurological: Negative for dizziness, loss of consciousness and headaches.  Endo/Heme/Allergies: Negative for environmental allergies.  Psychiatric/Behavioral: Negative for depression, hallucinations, substance abuse and suicidal ideas. The patient is not nervous/anxious and does not have insomnia.     BP 122/60 (BP Location: Left Arm, Patient Position: Sitting, Cuff Size: Normal)   Pulse 78   Temp 98.2 F (36.8 C)   Resp 18   Ht 5\' 10"  (1.778 m)   Wt 176 lb (79.8 kg)   SpO2 95%   BMI 25.25 kg/m   Physical Exam  Constitutional: He is oriented to person, place, and time and  well-developed, well-nourished, and in no distress.  HENT:  Head: Normocephalic and atraumatic.  Right Ear: External ear normal.  Left Ear: External ear normal.  Nose: Nose normal.  Mouth/Throat: Oropharynx is clear and moist. No oropharyngeal exudate.  Eyes: Conjunctivae and EOM are normal. Pupils are equal, round, and reactive to light.  Neck: Neck supple. No thyromegaly present.  Cardiovascular: Normal rate, regular rhythm, normal heart sounds and intact distal pulses.   Pulmonary/Chest: Effort normal  and breath sounds normal. No respiratory distress. He has no wheezes. He has no rales. He exhibits no tenderness.  Abdominal: Soft. Bowel sounds are normal. He exhibits no distension and no mass. There is no tenderness. There is no rebound and no guarding.  Genitourinary: Testes/scrotum normal.  Lymphadenopathy:    He has no cervical adenopathy.  Neurological: He is alert and oriented to person, place, and time.  Skin: Skin is warm and dry. No rash noted.  Psychiatric: Affect normal.  Vitals reviewed.  Assessment/Plan: Hypertension BP controlled. Labs today. Continue current medication regimen. FU with Cardiology as scheduled.   Hyperlipidemia Previously well-controlled on current regimen. Will check fasting labs today. Will alter regimen accordingly.   Encounter for annual physical exam Depression screen negative. Health Maintenance reviewed -- See [place] notes. Immunizations up-to-date. Colonoscopy up-to-date (every 5 years). Patient followed by Urology Risa Grill) for BPH and prostate screens. Has FU scheduled.  Preventive schedule discussed and handout given in AVS. Will obtain fasting labs today.     Leeanne Rio, PA-C

## 2016-02-15 NOTE — Patient Instructions (Addendum)
Continue chronic medications as directed. Stop by the lab for blood work. I will call with your results. Please follow-up with Dr. Risa Grill as scheduled.    Eat heart healthy diet (full of fruits, vegetables, whole grains, lean protein, water--limit salt, fat, and sugar intake) and increase physical activity as tolerated.  Continue doing brain stimulating activities (puzzles, reading, adult coloring books, staying active) to keep memory sharp.   Bring a copy of your advance directives to your next office visit.  Fat and Cholesterol Restricted Diet High levels of fat and cholesterol in your blood may lead to various health problems, such as diseases of the heart, blood vessels, gallbladder, liver, and pancreas. Fats are concentrated sources of energy that come in various forms. Certain types of fat, including saturated fat, may be harmful in excess. Cholesterol is a substance needed by your body in small amounts. Your body makes all the cholesterol it needs. Excess cholesterol comes from the food you eat. When you have high levels of cholesterol and saturated fat in your blood, health problems can develop because the excess fat and cholesterol will gather along the walls of your blood vessels, causing them to narrow. Choosing the right foods will help you control your intake of fat and cholesterol. This will help keep the levels of these substances in your blood within normal limits and reduce your risk of disease. What is my plan? Your health care provider recommends that you:  Limit your fat intake to ______% or less of your total calories per day.  Limit the amount of cholesterol in your diet to less than _________mg per day.  Eat 20-30 grams of fiber each day. What types of fat should I choose?  Choose healthy fats more often. Choose monounsaturated and polyunsaturated fats, such as olive and canola oil, flaxseeds, walnuts, almonds, and seeds.  Eat more omega-3 fats. Good choices include  salmon, mackerel, sardines, tuna, flaxseed oil, and ground flaxseeds. Aim to eat fish at least two times a week.  Limit saturated fats. Saturated fats are primarily found in animal products, such as meats, butter, and cream. Plant sources of saturated fats include palm oil, palm kernel oil, and coconut oil.  Avoid foods with partially hydrogenated oils in them. These contain trans fats. Examples of foods that contain trans fats are stick margarine, some tub margarines, cookies, crackers, and other baked goods. What general guidelines do I need to follow? These guidelines for healthy eating will help you control your intake of fat and cholesterol:  Check food labels carefully to identify foods with trans fats or high amounts of saturated fat.  Fill one half of your plate with vegetables and green salads.  Fill one fourth of your plate with whole grains. Look for the word "whole" as the first word in the ingredient list.  Fill one fourth of your plate with lean protein foods.  Limit fruit to two servings a day. Choose fruit instead of juice.  Eat more foods that contain fiber, such as apples, broccoli, carrots, beans, peas, and barley.  Eat more home-cooked food and less restaurant, buffet, and fast food.  Limit or avoid alcohol.  Limit foods high in starch and sugar.  Limit fried foods.  Cook foods using methods other than frying. Baking, boiling, grilling, and broiling are all great options.  Lose weight if you are overweight. Losing just 5-10% of your initial body weight can help your overall health and prevent diseases such as diabetes and heart disease. What foods can  I eat? Grains  Whole grains, such as whole wheat or whole grain breads, crackers, cereals, and pasta. Unsweetened oatmeal, bulgur, barley, quinoa, or brown rice. Corn or whole wheat flour tortillas. Vegetables  Fresh or frozen vegetables (raw, steamed, roasted, or grilled). Green salads. Fruits  All fresh,  canned (in natural juice), or frozen fruits. Meats and other protein foods  Ground beef (85% or leaner), grass-fed beef, or beef trimmed of fat. Skinless chicken or [place]. Ground chicken or [place]. Pork trimmed of fat. All fish and seafood. Eggs. Dried beans, peas, or lentils. Unsalted nuts or seeds. Unsalted canned or dry beans. Dairy  Low-fat dairy products, such as skim or 1% milk, 2% or reduced-fat cheeses, low-fat ricotta or cottage cheese, or plain low-fat yo Fats and oils  Tub margarines without trans fats. Light or reduced-fat mayonnaise and salad dressings. Avocado. Olive, canola, sesame, or safflower oils. Natural peanut or almond butter (choose ones without added sugar and oil). The items listed above may not be a complete list of recommended foods or beverages. Contact your dietitian for more options.  Foods to avoid Grains  White bread. White pasta. White rice. Cornbread. Bagels, pastries, and croissants. Crackers that contain trans fat. Vegetables  White potatoes. Corn. Creamed or fried vegetables. Vegetables in a cheese sauce. Fruits  Dried fruits. Canned fruit in light or heavy syrup. Fruit juice. Meats and other protein foods  Fatty cuts of meat. Ribs, chicken wings, bacon, sausage, bologna, salami, chitterlings, fatback, hot dogs, bratwurst, and packaged luncheon meats. Liver and organ meats. Dairy  Whole or 2% milk, cream, half-and-half, and cream cheese. Whole milk cheeses. Whole-fat or sweetened yogurt. Full-fat cheeses. Nondairy creamers and whipped toppings. Processed cheese, cheese spreads, or cheese curds. Beverages  Alcohol. Sweetened drinks (such as sodas, lemonade, and fruit drinks or punches). Fats and oils  Butter, stick margarine, lard, shortening, ghee, or bacon fat. Coconut, palm kernel, or palm oils. Sweets and desserts  Corn syrup, sugars, honey, and molasses. Candy. Jam and jelly. Syrup. Sweetened cereals. Cookies, pies, cakes, donuts, muffins,  and ice cream. The items listed above may not be a complete list of foods and beverages to avoid. Contact your dietitian for more information.  This information is not intended to replace advice given to you by your health care provider. Make sure you discuss any questions you have with your health care provider. Document Released: 01/22/2005 Document Revised: 02/12/2014 Document Reviewed: 04/22/2013 Elsevier Interactive Patient Education  2017 [employer] Prevention in the Home Introduction Falls can cause injuries. They can happen to people of all ages. There are many things you can do to make your home safe and to help prevent falls. What can I do on the outside of my home?  Regularly fix the edges of walkways and driveways and fix any cracks.  Remove anything that might make you trip as you walk through a door, such as a raised step or threshold.  Trim any bushes or trees on the path to your home.  Use bright outdoor lighting.  Clear any walking paths of anything that might make someone trip, such as rocks or tools.  Regularly check to see if handrails are loose or broken. Make sure that both sides of any steps have handrails.  Any raised decks and porches should have guardrails on the edges.  Have any leaves, snow, or ice cleared regularly.  Use sand or salt on walking paths during winter.  Clean up any spills in your garage  right away. This includes oil or grease spills. What can I do in the bathroom?  Use night lights.  Install grab bars by the toilet and in the tub and shower. Do not use towel bars as grab bars.  Use non-skid mats or decals in the tub or shower.  If you need to sit down in the shower, use a plastic, non-slip stool.  Keep the floor dry. Clean up any water that spills on the floor as soon as it happens.  Remove soap buildup in the tub or shower regularly.  Attach bath mats securely with double-sided non-slip rug tape.  Do not have throw  rugs and other things on the floor that can make you trip. What can I do in the bedroom?  Use night lights.  Make sure that you have a light by your bed that is easy to reach.  Do not use any sheets or blankets that are too big for your bed. They should not hang down onto the floor.  Have a firm chair that has side arms. You can use this for support while you get dressed.  Do not have throw rugs and other things on the floor that can make you trip. What can I do in the kitchen?  Clean up any spills right away.  Avoid walking on wet floors.  Keep items that you use a lot in easy-to-reach places.  If you need to reach something above you, use a strong step stool that has a grab bar.  Keep electrical cords out of the way.  Do not use floor polish or wax that makes floors slippery. If you must use wax, use non-skid floor wax.  Do not have throw rugs and other things on the floor that can make you trip. What can I do with my stairs?  Do not leave any items on the stairs.  Make sure that there are handrails on both sides of the stairs and use them. Fix handrails that are broken or loose. Make sure that handrails are as long as the stairways.  Check any carpeting to make sure that it is firmly attached to the stairs. Fix any carpet that is loose or worn.  Avoid having throw rugs at the top or bottom of the stairs. If you do have throw rugs, attach them to the floor with carpet tape.  Make sure that you have a light switch at the top of the stairs and the bottom of the stairs. If you do not have them, ask someone to add them for you. What else can I do to help prevent falls?  Wear shoes that:  Do not have high heels.  Have rubber bottoms.  Are comfortable and fit you well.  Are closed at the toe. Do not wear sandals.  If you use a stepladder:  Make sure that it is fully opened. Do not climb a closed stepladder.  Make sure that both sides of the stepladder are locked into  place.  Ask someone to hold it for you, if possible.  Clearly mark and make sure that you can see:  Any grab bars or handrails.  First and last steps.  Where the edge of each step is.  Use tools that help you move around (mobility aids) if they are needed. These include:  Canes.  Walkers.  Scooters.  Crutches.  Turn on the lights when you go into a dark area. Replace any light bulbs as soon as they burn out.  Set up  your furniture so you have a clear path. Avoid moving your furniture around.  If any of your floors are uneven, fix them.  If there are any pets around you, be aware of where they are.  Review your medicines with your doctor. Some medicines can make you feel dizzy. This can increase your chance of falling. Ask your doctor what other things that you can do to help prevent falls. This information is not intended to replace advice given to you by your health care provider. Make sure you discuss any questions you have with your health care provider. Document Released: 11/18/2008 Document Revised: 06/30/2015 Document Reviewed: 02/26/2014  2017 Elsevier  Health Maintenance, Male A healthy lifestyle and preventative care can promote health and wellness.  Maintain regular health, dental, and eye exams.  Eat a healthy diet. Foods like vegetables, fruits, whole grains, low-fat dairy products, and lean protein foods contain the nutrients you need and are low in calories. Decrease your intake of foods high in solid fats, added sugars, and salt. Get information about a proper diet from your health care provider, if necessary.  Regular physical exercise is one of the most important things you can do for your health. Most adults should get at least 150 minutes of moderate-intensity exercise (any activity that increases your heart rate and causes you to sweat) each week. In addition, most adults need muscle-strengthening exercises on 2 or more days a week.   Maintain a  healthy weight. The body mass index (BMI) is a screening tool to identify possible weight problems. It provides an estimate of body fat based on height and weight. Your health care provider can find your BMI and can help you achieve or maintain a healthy weight. For males 20 years and older:  A BMI below 18.5 is considered underweight.  A BMI of 18.5 to 24.9 is normal.  A BMI of 25 to 29.9 is considered overweight.  A BMI of 30 and above is considered obese.  Maintain normal blood lipids and cholesterol by exercising and minimizing your intake of saturated fat. Eat a balanced diet with plenty of fruits and vegetables. Blood tests for lipids and cholesterol should begin at age 24 and be repeated every 5 years. If your lipid or cholesterol levels are high, you are over age 33, or you are at high risk for heart disease, you may need your cholesterol levels checked more frequently.Ongoing high lipid and cholesterol levels should be treated with medicines if diet and exercise are not working.  If you smoke, find out from your health care provider how to quit. If you do not use tobacco, do not start.  Lung cancer screening is recommended for adults aged 72-80 years who are at high risk for developing lung cancer because of a history of smoking. A yearly low-dose CT scan of the lungs is recommended for people who have at least a 30-pack-year history of smoking and are current smokers or have quit within the past 15 years. A pack year of smoking is smoking an average of 1 pack of cigarettes a day for 1 year (for example, a 30-pack-year history of smoking could mean smoking 1 pack a day for 30 years or 2 packs a day for 15 years). Yearly screening should continue until the smoker has stopped smoking for at least 15 years. Yearly screening should be stopped for people who develop a health problem that would prevent them from having lung cancer treatment.  If you choose to drink alcohol,  do not have more than  2 drinks per day. One drink is considered to be 12 oz (360 mL) of beer, 5 oz (150 mL) of wine, or 1.5 oz (45 mL) of liquor.  Avoid the use of street drugs. Do not share needles with anyone. Ask for help if you need support or instructions about stopping the use of drugs.  High blood pressure causes heart disease and increases the risk of stroke. High blood pressure is more likely to develop in:  People who have blood pressure in the end of the normal range (100-139/85-89 mm Hg).  People who are overweight or obese.  People who are African American.  If you are 15-62 years of age, have your blood pressure checked every 3-5 years. If you are 89 years of age or older, have your blood pressure checked every year. You should have your blood pressure measured twice-once when you are at a hospital or clinic, and once when you are not at a hospital or clinic. Record the average of the two measurements. To check your blood pressure when you are not at a hospital or clinic, you can use:  An automated blood pressure machine at a pharmacy.  A home blood pressure monitor.  If you are 82-81 years old, ask your health care provider if you should take aspirin to prevent heart disease.  Diabetes screening involves taking a blood sample to check your fasting blood sugar level. This should be done once every 3 years after age 42 if you are at a normal weight and without risk factors for diabetes. Testing should be considered at a younger age or be carried out more frequently if you are overweight and have at least 1 risk factor for diabetes.  Colorectal cancer can be detected and often prevented. Most routine colorectal cancer screening begins at the age of 81 and continues through age 70. However, your health care provider may recommend screening at an earlier age if you have risk factors for colon cancer. On a yearly basis, your health care provider may provide home test kits to check for hidden blood in the  stool. A small camera at the end of a tube may be used to directly examine the colon (sigmoidoscopy or colonoscopy) to detect the earliest forms of colorectal cancer. Talk to your health care provider about this at age 62 when routine screening begins. A direct exam of the colon should be repeated every 5-10 years through age 69, unless early forms of precancerous polyps or small growths are found.  People who are at an increased risk for hepatitis B should be screened for this virus. You are considered at high risk for hepatitis B if:  You were born in a country where hepatitis B occurs often. Talk with your health care provider about which countries are considered high risk.  Your parents were born in a high-risk country and you have not received a shot to protect against hepatitis B (hepatitis B vaccine).  You have HIV or AIDS.  You use needles to inject street drugs.  You live with, or have sex with, someone who has hepatitis B.  You are a man who has sex with other men (MSM).  You get hemodialysis treatment.  You take certain medicines for conditions like cancer, organ transplantation, and autoimmune conditions.  Hepatitis C blood testing is recommended for all people born from [birth year] through 1963 and any individual with known risk factors for hepatitis C.  Healthy men should no longer  receive prostate-specific antigen (PSA) blood tests as part of routine cancer screening. Talk to your health care provider about prostate cancer screening.  Testicular cancer screening is not recommended for adolescents or adult males who have no symptoms. Screening includes self-exam, a health care provider exam, and other screening tests. Consult with your health care provider about any symptoms you have or any concerns you have about testicular cancer.  Practice safe sex. Use condoms and avoid high-risk sexual practices to reduce the spread of sexually transmitted infections (STIs).  You should be  screened for STIs, including gonorrhea and chlamydia if:  You are sexually active and are younger than 24 years.  You are older than 24 years, and your health care provider tells you that you are at risk for this type of infection.  Your sexual activity has changed since you were last screened, and you are at an increased risk for chlamydia or gonorrhea. Ask your health care provider if you are at risk.  If you are at risk of being infected with HIV, it is recommended that you take a prescription medicine daily to prevent HIV infection. This is called pre-exposure prophylaxis (PrEP). You are considered at risk if:  You are a man who has sex with other men (MSM).  You are a heterosexual man who is sexually active with multiple partners.  You take drugs by injection.  You are sexually active with a partner who has HIV.  Talk with your health care provider about whether you are at high risk of being infected with HIV. If you choose to begin PrEP, you should first be tested for HIV. You should then be tested every 3 months for as long as you are taking PrEP.  Use sunscreen. Apply sunscreen liberally and repeatedly throughout the day. You should seek shade when your shadow is shorter than you. Protect yourself by wearing long sleeves, pants, a wide-brimmed hat, and sunglasses year round whenever you are outdoors.  Tell your health care provider of new moles or changes in moles, especially if there is a change in shape or color. Also, tell your health care provider if a mole is larger than the size of a pencil eraser.  A one-time screening for abdominal aortic aneurysm (AAA) and surgical repair of large AAAs by ultrasound is recommended for men aged 52-75 years who are current or former smokers.  Stay current with your vaccines (immunizations). This information is not intended to replace advice given to you by your health care provider. Make sure you discuss any questions you have with your  health care provider. Document Released: 07/21/2007 Document Revised: 02/12/2014 Document Reviewed: 10/26/2014 Elsevier Interactive Patient Education  2017 [employer].

## 2016-02-15 NOTE — Progress Notes (Signed)
Pre visit review using our clinic review tool, if applicable. No additional management support is needed unless otherwise documented below in the visit note. 

## 2016-02-16 ENCOUNTER — Other Ambulatory Visit: Payer: Self-pay | Admitting: Physician Assistant

## 2016-02-16 DIAGNOSIS — R7989 Other specified abnormal findings of blood chemistry: Secondary | ICD-10-CM

## 2016-02-16 DIAGNOSIS — R945 Abnormal results of liver function studies: Principal | ICD-10-CM

## 2016-02-23 DIAGNOSIS — Z0000 Encounter for general adult medical examination without abnormal findings: Secondary | ICD-10-CM | POA: Insufficient documentation

## 2016-02-23 NOTE — Assessment & Plan Note (Signed)
BP controlled. Labs today. Continue current medication regimen. FU with Cardiology as scheduled.

## 2016-02-23 NOTE — Assessment & Plan Note (Signed)
Depression screen negative. Health Maintenance reviewed -- See [facility] notes. Immunizations up-to-date. Colonoscopy up-to-date (every 5 years). Patient followed by Urology Risa Grill) for BPH and prostate screens. Has FU scheduled.  Preventive schedule discussed and handout given in AVS. Will obtain fasting labs today.

## 2016-02-23 NOTE — Assessment & Plan Note (Signed)
Previously well-controlled on current regimen. Will check fasting labs today. Will alter regimen accordingly.

## 2016-02-29 ENCOUNTER — Other Ambulatory Visit: Payer: Medicare Other

## 2016-03-01 ENCOUNTER — Encounter: Payer: Self-pay | Admitting: Emergency Medicine

## 2016-03-01 ENCOUNTER — Other Ambulatory Visit (INDEPENDENT_AMBULATORY_CARE_PROVIDER_SITE_OTHER): Payer: Medicare Other

## 2016-03-01 DIAGNOSIS — R7989 Other specified abnormal findings of blood chemistry: Secondary | ICD-10-CM

## 2016-03-01 DIAGNOSIS — R945 Abnormal results of liver function studies: Principal | ICD-10-CM

## 2016-03-01 LAB — COMPREHENSIVE METABOLIC PANEL
ALBUMIN: 3.9 g/dL (ref 3.5–5.2)
ALT: 10 U/L (ref 0–53)
AST: 10 U/L (ref 0–37)
Alkaline Phosphatase: 130 U/L — ABNORMAL HIGH (ref 39–117)
BILIRUBIN TOTAL: 0.5 mg/dL (ref 0.2–1.2)
BUN: 8 mg/dL (ref 6–23)
CALCIUM: 9 mg/dL (ref 8.4–10.5)
CHLORIDE: 105 meq/L (ref 96–112)
CO2: 31 mEq/L (ref 19–32)
CREATININE: 0.68 mg/dL (ref 0.40–1.50)
GFR: 121.35 mL/min (ref >60.00)
Glucose, Bld: 138 mg/dL — ABNORMAL HIGH (ref 70–99)
Potassium: 3.7 mEq/L (ref 3.5–5.1)
SODIUM: 140 meq/L (ref 135–145)
Total Protein: 6.5 g/dL (ref 6.0–8.3)

## 2016-03-01 LAB — ALKALINE PHOSPHATASE: Alkaline Phosphatase: 130 U/L — ABNORMAL HIGH (ref 39–117)

## 2016-03-01 NOTE — Addendum Note (Signed)
Addended by: Katina Dung on: 03/01/2016 10:05 AM   Modules accepted: Orders

## 2016-03-05 ENCOUNTER — Other Ambulatory Visit (INDEPENDENT_AMBULATORY_CARE_PROVIDER_SITE_OTHER): Payer: Medicare Other

## 2016-03-05 DIAGNOSIS — R748 Abnormal levels of other serum enzymes: Secondary | ICD-10-CM | POA: Diagnosis not present

## 2016-03-05 LAB — VITAMIN D 25 HYDROXY (VIT D DEFICIENCY, FRACTURES): VITD: 30.96 ng/mL (ref 30.00–100.00)

## 2016-03-06 ENCOUNTER — Telehealth: Payer: Self-pay | Admitting: Physician Assistant

## 2016-03-06 ENCOUNTER — Other Ambulatory Visit: Payer: Self-pay | Admitting: *Deleted

## 2016-03-06 DIAGNOSIS — R748 Abnormal levels of other serum enzymes: Secondary | ICD-10-CM

## 2016-03-06 NOTE — Telephone Encounter (Signed)
Patient requesting call back from [facility] regarding question he has about lab test he needs.

## 2016-03-06 NOTE — Telephone Encounter (Signed)
Spoke with patient to confirm what labs needed to be done.  Appointment made from 03/09/16 for lab only.

## 2016-03-09 ENCOUNTER — Telehealth: Payer: Self-pay | Admitting: Physician Assistant

## 2016-03-09 ENCOUNTER — Other Ambulatory Visit (INDEPENDENT_AMBULATORY_CARE_PROVIDER_SITE_OTHER): Payer: Medicare Other

## 2016-03-09 DIAGNOSIS — M546 Pain in thoracic spine: Secondary | ICD-10-CM | POA: Diagnosis not present

## 2016-03-09 DIAGNOSIS — R748 Abnormal levels of other serum enzymes: Secondary | ICD-10-CM

## 2016-03-09 DIAGNOSIS — M5136 Other intervertebral disc degeneration, lumbar region: Secondary | ICD-10-CM | POA: Diagnosis not present

## 2016-03-09 DIAGNOSIS — M47816 Spondylosis without myelopathy or radiculopathy, lumbar region: Secondary | ICD-10-CM | POA: Diagnosis not present

## 2016-03-09 DIAGNOSIS — M549 Dorsalgia, unspecified: Secondary | ICD-10-CM | POA: Diagnosis not present

## 2016-03-09 LAB — GAMMA GT: GGT: 27 U/L (ref 7–51)

## 2016-03-09 NOTE — Telephone Encounter (Signed)
Spoke with patient concerning concerns. Will call back when results are in.

## 2016-03-09 NOTE — Telephone Encounter (Signed)
Patient requesting call back from [facility], he has questions about his elevated liver numbers and why what he is being tested for.  Patient has appt with neurologist this afternoon at 3pm. Please call him after 4pm.

## 2016-03-12 LAB — PARATHYROID HORMONE, INTACT (NO CA): PTH: 50 pg/mL (ref 14–64)

## 2016-03-27 ENCOUNTER — Other Ambulatory Visit: Payer: Self-pay | Admitting: Physician Assistant

## 2016-03-27 NOTE — Telephone Encounter (Signed)
Indication for chronic opioid: Chronic pain Medication and dose: Hydrocodone-Acetaminophen 5/325 mg # pills per month: #90 filled on 01/20/16 Last UDS date: 08/05/15  Pain contract signed (Y/N): Yes 08/05/15 Date narcotic database last reviewed (include red flags):

## 2016-03-27 NOTE — Telephone Encounter (Signed)
**  Remind patient they can make refill requests via MyChart**  Medication refill request (Name & Dosage): hydrocodone-acetaminophen 5-325 mg tablet     Preferred pharmacy (Name & Address):      Other comments (if applicable):

## 2016-03-29 NOTE — Telephone Encounter (Signed)
Patient stopped by office to check status of refill.  Advised patient script not ready at this time, please allow at least 3 business days for refill requests.  Patient states he not currently out of medication but he was on this side of town and decided to stop and check on status.

## 2016-03-30 MED ORDER — HYDROCODONE-ACETAMINOPHEN 5-325 MG PO TABS: 0.5000 | ORAL_TABLET | Freq: Four times a day (QID) | ORAL | 0 refills | Status: DC | PRN

## 2016-03-30 NOTE — Telephone Encounter (Signed)
[place] Database reviewed today. No red flags. Last fill 3 months ago. Patient taking medication much less than as prescribed.  Refill granted. Needs UDS at pickup.

## 2016-03-31 ENCOUNTER — Other Ambulatory Visit: Payer: Self-pay | Admitting: Gastroenterology

## 2016-03-31 DIAGNOSIS — R1314 Dysphagia, pharyngoesophageal phase: Secondary | ICD-10-CM

## 2016-03-31 DIAGNOSIS — Z86010 Personal history of colonic polyps: Secondary | ICD-10-CM

## 2016-03-31 DIAGNOSIS — K219 Gastro-esophageal reflux disease without esophagitis: Secondary | ICD-10-CM

## 2016-04-02 ENCOUNTER — Other Ambulatory Visit: Payer: Self-pay | Admitting: Gastroenterology

## 2016-04-02 DIAGNOSIS — R1314 Dysphagia, pharyngoesophageal phase: Secondary | ICD-10-CM

## 2016-04-02 DIAGNOSIS — Z86010 Personal history of colonic polyps: Secondary | ICD-10-CM

## 2016-04-02 DIAGNOSIS — K219 Gastro-esophageal reflux disease without esophagitis: Secondary | ICD-10-CM

## 2016-04-03 ENCOUNTER — Encounter: Payer: Self-pay | Admitting: Physician Assistant

## 2016-04-12 ENCOUNTER — Other Ambulatory Visit: Payer: Self-pay | Admitting: Physician Assistant

## 2016-04-13 ENCOUNTER — Other Ambulatory Visit: Payer: Self-pay | Admitting: Physician Assistant

## 2016-05-02 ENCOUNTER — Other Ambulatory Visit: Payer: Self-pay | Admitting: Gastroenterology

## 2016-05-02 DIAGNOSIS — K219 Gastro-esophageal reflux disease without esophagitis: Secondary | ICD-10-CM

## 2016-05-02 DIAGNOSIS — R1314 Dysphagia, pharyngoesophageal phase: Secondary | ICD-10-CM

## 2016-05-02 DIAGNOSIS — Z86010 Personal history of colonic polyps: Secondary | ICD-10-CM

## 2016-05-10 ENCOUNTER — Telehealth: Payer: Self-pay | Admitting: Gastroenterology

## 2016-05-10 DIAGNOSIS — Z86010 Personal history of colonic polyps: Secondary | ICD-10-CM

## 2016-05-10 DIAGNOSIS — R1314 Dysphagia, pharyngoesophageal phase: Secondary | ICD-10-CM

## 2016-05-10 DIAGNOSIS — K219 Gastro-esophageal reflux disease without esophagitis: Secondary | ICD-10-CM

## 2016-05-10 MED ORDER — OMEPRAZOLE 20 MG PO CPDR: DELAYED_RELEASE_CAPSULE | ORAL | 1 refills | Status: DC

## 2016-05-10 NOTE — Telephone Encounter (Signed)
Prescription sent to patient's pharmacy until scheduled appt. 

## 2016-05-11 ENCOUNTER — Other Ambulatory Visit: Payer: Self-pay | Admitting: Cardiology

## 2016-05-11 ENCOUNTER — Other Ambulatory Visit: Payer: Self-pay | Admitting: Physician Assistant

## 2016-05-14 ENCOUNTER — Other Ambulatory Visit: Payer: Self-pay | Admitting: Cardiology

## 2016-05-21 ENCOUNTER — Other Ambulatory Visit: Payer: Self-pay | Admitting: *Deleted

## 2016-05-21 NOTE — Telephone Encounter (Signed)
Indication for chronic opioid: Low back pain Medication and dose: Hydrocodone-APAP 5-325 # pills per month: #990 on 03/30/16 Last UDS date: 03/30/16 Low risk Pain contract signed (Y/N): Yes on 08/05/15 Date narcotic database last reviewed (include red flags):

## 2016-05-21 NOTE — Telephone Encounter (Signed)
Patient asking for refill on the Hydrocodone-acetaminophen 5-325mg   He is going on vacation next week and will not have enough to get him through if he needs it while he is gone.   Patient aware that someone will call him to let him know when it is ready for pickup.

## 2016-05-21 NOTE — Addendum Note (Signed)
Addended by: Leonidas Romberg on: 05/21/2016 03:05 PM   Modules accepted: Orders

## 2016-05-22 MED ORDER — HYDROCODONE-ACETAMINOPHEN 5-325 MG PO TABS: 0.5000 | ORAL_TABLET | Freq: Four times a day (QID) | ORAL | 0 refills | Status: DC | PRN

## 2016-05-22 NOTE — Telephone Encounter (Signed)
Rx ready for pick up. 

## 2016-05-22 NOTE — Telephone Encounter (Signed)
Patient's wife is aware that RX is ready - stated that they will come pick it up either later today or in the morning.

## 2016-06-13 ENCOUNTER — Ambulatory Visit (INDEPENDENT_AMBULATORY_CARE_PROVIDER_SITE_OTHER): Payer: Medicare Other

## 2016-06-13 ENCOUNTER — Encounter: Payer: Self-pay | Admitting: Physician Assistant

## 2016-06-13 ENCOUNTER — Ambulatory Visit (INDEPENDENT_AMBULATORY_CARE_PROVIDER_SITE_OTHER): Payer: Medicare Other | Admitting: Physician Assistant

## 2016-06-13 ENCOUNTER — Other Ambulatory Visit: Payer: Self-pay | Admitting: Physician Assistant

## 2016-06-13 VITALS — BP 122/70 | HR 65 | Temp 98.6°F | Resp 14 | Ht 70.0 in | Wt 178.0 lb

## 2016-06-13 DIAGNOSIS — M25551 Pain in right hip: Secondary | ICD-10-CM | POA: Diagnosis not present

## 2016-06-13 DIAGNOSIS — J069 Acute upper respiratory infection, unspecified: Secondary | ICD-10-CM | POA: Diagnosis not present

## 2016-06-13 MED ORDER — MELOXICAM 7.5 MG PO TABS: 7.5000 mg | ORAL_TABLET | Freq: Every day | ORAL | 0 refills | Status: DC

## 2016-06-13 NOTE — Patient Instructions (Signed)
Please continue chronic medications as directed. Start the Meloxicam daily with food over the next week. Hydrocodone only if needed for breakthrough pain.  Avoid laying on affected side. Apply heating pad to the area for 15 minutes a few times per day.   Follow-up with Dr. Rita Ohara as scheduled. Do not forget to go to the [facility] office for imaging.  I will call you with your results.  Stay well hydrated and get plenty of rest. Use some saline nasal rinse to flush out nasal passages.  Can consider using OTC Flonase to help with nasal congestion. Call if not resolving.

## 2016-06-13 NOTE — Progress Notes (Signed)
Pre visit review using our clinic review tool, if applicable. No additional management support is needed unless otherwise documented below in the visit note. 

## 2016-06-13 NOTE — Progress Notes (Signed)
Patient presents to clinic today c/o 2 weeks of R hip pain in patient with history of OA of multiple joints. Noted more issue in R hip over the past 2 months described as worsening aching pain. Occasionally takes a hydrocodone for his OA with relief. Noted over the past 2 weeks -- cannot lay on R hip due to the pain this causes. Pain is still aching but builds and builds if he lays on affected side. Denies pain with ambulation. Dr. Sherwood Gambler -- Follow-up on 5/29 scheduled.  Previously on Relafen with good relief in OA symptoms.   Patient noted having aches associated with chills, nasal congestion and runny nose developing Monday morning. Endorses symptoms have improved significantly, just having runny nose at present.  Past Medical History:  Diagnosis Date  . Aortic stenosis    mild echocardiogram 8/09 EF 60%, showed no regional wall motion abnml, mild LVH, mod focal basal septal hypertrophy and mild dyastolic dysfunction. 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 mean gradient across aortic valve was 8 mmHG. also mild L atrial enlargement and normal RV size and function. minimal AS on LHC in 7/10.   Marland Kitchen BPH (benign prostatic hypertrophy)   . CAD (coronary artery disease)    LCH (7/10) totally occluded proximal RCA with very robuse L to R collaterals, 50% proximal LAD stenosis, EF 65%, medical management.    . Chicken pox   . Diverticulosis   . Esophageal stricture   . GERD (gastroesophageal reflux disease)   . Hiatal hernia   . HLD (hyperlipidemia)   . HTN (hypertension)   . Low back pain    s/p surgical fusion  . Osteoarthritis   . Smoker   . TIA (transient ischemic attack)    hx  . Tubular adenoma of colon 2009    Current Outpatient Prescriptions on File Prior to Visit  Medication Sig Dispense Refill  . amLODipine (NORVASC) 5 MG tablet Take 1 tablet (5 mg total) by mouth daily. *Patient is overdue for an appointment and needs to call and  schedule for further refills* 30 tablet 0  . aspirin 81 MG EC tablet Take 81 mg by mouth daily.      Marland Kitchen atorvastatin (LIPITOR) 40 MG tablet Take 40 mg by mouth daily.    . benazepril (LOTENSIN) 20 MG tablet TAKE 1 TABLET BY MOUTH DAILY 90 tablet 1  . cholecalciferol (VITAMIN D) 1000 UNITS tablet Take 1,000 Units by mouth as directed.     . fluticasone (FLONASE) 50 MCG/ACT nasal spray Place 2 sprays into both nostrils daily as needed. 48 g 1  . HYDROcodone-acetaminophen (NORCO/VICODIN) 5-325 MG tablet Take 0.5-1 tablets by mouth every 6 (six) hours as needed for moderate pain. 90 tablet 0  . isosorbide mononitrate (IMDUR) 30 MG 24 hr tablet TAKE 1 TABLET BY MOUTH AT BEDTIME 90 tablet 1  . metoprolol (LOPRESSOR) 50 MG tablet TAKE 1 TABLET BY MOUTH TWICE DAILY 180 tablet 0  . Omega-3 Fatty Acids (FISH OIL) 1000 MG CAPS Take 2 capsules by mouth 2 (two) times daily.      Marland Kitchen omeprazole (PRILOSEC) 20 MG capsule TAKE 1 CAPSULE(20 MG) BY MOUTH DAILY 30 capsule 1  . vitamin B-12 (CYANOCOBALAMIN) 1000 MCG tablet Take 1,000 mcg by mouth daily.       No current facility-administered medications on file prior to visit.     Allergies  Allergen Reactions  . Celecoxib     REACTION: hives and  itching    Family History  Problem Relation Age of Onset  . Heart disease Father 31    Living  . Coronary artery disease Father     CABG  . Alzheimer's disease Mother 37    Deceased  . Arthritis/Rheumatoid Mother   . Stomach cancer Maternal Uncle   . Brain cancer Maternal Aunt     x2  . Arthritis Brother     DJD  . Obesity Daughter     Had Bypass Sx    Social History   Social History  . Marital status: Married    Spouse name: etta  . Number of children: 2  . Years of education: N/A   Occupational History  . retired Retired    disabled due to back problems   Social History Main Topics  . Smoking status: Former Smoker    Packs/day: 1.50    Years: 50.00    Types: Cigarettes    Quit date:  06/2015  . Smokeless tobacco: Former Systems developer     Comment: smoked less than 1 ppd for 40+ years; Using Administrator, sports  . Alcohol use No     Comment: social   . Drug use: No  . Sexual activity: Not Asked   Other Topics Concern  . None   Social History Narrative   Married (3rd), [place]. 2 children from 1st marriage, 4 step children.    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       Cell # [id number]   Review of Systems - See HPI.  All other ROS are negative.  BP 122/70   Pulse 65   Temp 98.6 F (37 C) (Oral)   Resp 14   Ht 5\' 10"  (1.778 m)   Wt 178 lb (80.7 kg)   SpO2 97%   BMI 25.54 kg/m   Physical Exam  Constitutional: He is oriented to person, place, and time and well-developed, well-nourished, and in no distress.  HENT:  Head: Normocephalic and atraumatic.  Right Ear: Tympanic membrane and external ear normal.  Left Ear: Tympanic membrane and external ear normal.  Nose: Mucosal edema and rhinorrhea present. Right sinus exhibits no maxillary sinus tenderness and no frontal sinus tenderness. Left sinus exhibits no maxillary sinus tenderness and no frontal sinus tenderness.  Mouth/Throat: Uvula is midline, oropharynx is clear and moist and mucous membranes are normal.  Eyes: Conjunctivae are normal.  Neck: Neck supple.  Cardiovascular: Normal rate, regular rhythm, normal heart sounds and intact distal pulses.   Pulmonary/Chest: Effort normal and breath sounds normal. No respiratory distress. He has no wheezes. He has no rales. He exhibits no tenderness.  Neurological: He is alert and oriented to person, place, and time.  Skin: Skin is warm and dry. No rash noted.  Psychiatric: Affect normal.  Vitals reviewed.  Assessment/Plan: 1. Right hip pain X-ray today. Start Mobic 7.5 mg daily. Topical Icy Hot. Stretches and supportive measures reviewed. - DG HIP UNILAT WITH PELVIS 2-3 VIEWS RIGHT;  Future  2. Viral URI Examination unremarkable. Supportive measures and OTC medications reviewed.   Leeanne Rio, PA-C

## 2016-06-14 ENCOUNTER — Other Ambulatory Visit: Payer: Self-pay | Admitting: Physician Assistant

## 2016-06-14 DIAGNOSIS — M25551 Pain in right hip: Secondary | ICD-10-CM

## 2016-06-16 ENCOUNTER — Encounter (HOSPITAL_COMMUNITY): Payer: Self-pay | Admitting: Emergency Medicine

## 2016-06-16 ENCOUNTER — Emergency Department (HOSPITAL_COMMUNITY): Payer: Medicare Other

## 2016-06-16 ENCOUNTER — Emergency Department (HOSPITAL_COMMUNITY)
Admission: EM | Admit: 2016-06-16 | Discharge: 2016-06-16 | Disposition: A | Discharge status: Home / Self Care | Payer: Medicare Other | Attending: Emergency Medicine | Admitting: Emergency Medicine

## 2016-06-16 DIAGNOSIS — I10 Essential (primary) hypertension: Secondary | ICD-10-CM | POA: Insufficient documentation

## 2016-06-16 DIAGNOSIS — Z7982 Long term (current) use of aspirin: Secondary | ICD-10-CM | POA: Diagnosis not present

## 2016-06-16 DIAGNOSIS — I2510 Atherosclerotic heart disease of native coronary artery without angina pectoris: Secondary | ICD-10-CM | POA: Insufficient documentation

## 2016-06-16 DIAGNOSIS — M2550 Pain in unspecified joint: Secondary | ICD-10-CM | POA: Insufficient documentation

## 2016-06-16 DIAGNOSIS — Z8673 Personal history of transient ischemic attack (TIA), and cerebral infarction without residual deficits: Secondary | ICD-10-CM | POA: Insufficient documentation

## 2016-06-16 DIAGNOSIS — Z87891 Personal history of nicotine dependence: Secondary | ICD-10-CM | POA: Diagnosis not present

## 2016-06-16 DIAGNOSIS — R222 Localized swelling, mass and lump, trunk: Secondary | ICD-10-CM | POA: Diagnosis not present

## 2016-06-16 LAB — CBC WITH DIFFERENTIAL/PLATELET
BASOS ABS: 0 10*3/uL (ref 0.0–0.1)
BASOS PCT: 0 %
Eosinophils Absolute: 0.1 10*3/uL (ref 0.0–0.7)
Eosinophils Relative: 1 %
HEMATOCRIT: 40.4 % (ref 39.0–52.0)
HEMOGLOBIN: 13.6 g/dL (ref 13.0–17.0)
LYMPHS PCT: 11 %
Lymphs Abs: 1.1 10*3/uL (ref 0.7–4.0)
MCH: 28.5 pg (ref 26.0–34.0)
MCHC: 33.7 g/dL (ref 30.0–36.0)
MCV: 84.7 fL (ref 78.0–100.0)
MONO ABS: 0.8 10*3/uL (ref 0.1–1.0)
MONOS PCT: 8 %
NEUTROS PCT: 80 %
Neutro Abs: 7.9 10*3/uL — ABNORMAL HIGH (ref 1.7–7.7)
Platelets: 143 10*3/uL — ABNORMAL LOW (ref 150–400)
RBC: 4.77 MIL/uL (ref 4.22–5.81)
RDW: 13.5 % (ref 11.5–15.5)
WBC: 9.8 10*3/uL (ref 4.0–10.5)

## 2016-06-16 LAB — URINALYSIS, ROUTINE W REFLEX MICROSCOPIC
BACTERIA UA: NONE SEEN
BILIRUBIN URINE: NEGATIVE
Glucose, UA: 150 mg/dL — AB
KETONES UR: NEGATIVE mg/dL
LEUKOCYTES UA: NEGATIVE
NITRITE: NEGATIVE
PROTEIN: 30 mg/dL — AB
SPECIFIC GRAVITY, URINE: 1.02 (ref 1.005–1.030)
SQUAMOUS EPITHELIAL / LPF: NONE SEEN
pH: 5 (ref 5.0–8.0)

## 2016-06-16 LAB — COMPREHENSIVE METABOLIC PANEL
ALT: 20 U/L (ref 17–63)
AST: 21 U/L (ref 15–41)
Albumin: 3.4 g/dL — ABNORMAL LOW (ref 3.5–5.0)
Alkaline Phosphatase: 114 U/L (ref 38–126)
Anion gap: 7 (ref 5–15)
BUN: 9 mg/dL (ref 6–20)
CHLORIDE: 104 mmol/L (ref 101–111)
CO2: 25 mmol/L (ref 22–32)
CREATININE: 0.82 mg/dL (ref 0.61–1.24)
Calcium: 8.6 mg/dL — ABNORMAL LOW (ref 8.9–10.3)
GFR calc Af Amer: >60 mL/min (ref >60)
GFR calc non Af Amer: >60 mL/min (ref >60)
Glucose, Bld: 171 mg/dL — ABNORMAL HIGH (ref 65–99)
POTASSIUM: 3.3 mmol/L — AB (ref 3.5–5.1)
SODIUM: 136 mmol/L (ref 135–145)
Total Bilirubin: 0.6 mg/dL (ref 0.3–1.2)
Total Protein: 6.7 g/dL (ref 6.5–8.1)

## 2016-06-16 LAB — TROPONIN I

## 2016-06-16 LAB — I-STAT CG4 LACTIC ACID, ED: Lactic Acid, Venous: 1.76 mmol/L (ref 0.5–1.9)

## 2016-06-16 LAB — LIPASE, BLOOD: Lipase: 14 U/L (ref 11–51)

## 2016-06-16 MED ORDER — DOXYCYCLINE HYCLATE 100 MG PO CAPS: 100.0000 mg | ORAL_CAPSULE | Freq: Two times a day (BID) | ORAL | 0 refills | Status: DC

## 2016-06-16 MED ORDER — SODIUM CHLORIDE 0.9 % IV BOLUS (SEPSIS)
1000.0000 mL | Freq: Once | INTRAVENOUS | Status: AC
  Administered 2016-06-16: 1000 mL via INTRAVENOUS

## 2016-06-16 MED ORDER — OXYCODONE-ACETAMINOPHEN 5-325 MG PO TABS: 2.0000 | ORAL_TABLET | ORAL | 0 refills | Status: DC | PRN

## 2016-06-16 NOTE — ED Triage Notes (Signed)
Pt complaint of worsening generalized joint pain since Wednesday. Seen by primary care and diagnosed with viral infection and arthritis.

## 2016-06-16 NOTE — ED Provider Notes (Signed)
[facility] DEPT Provider Note   CSN: [id number] Arrival date & time: 06/16/16  0850     History   Chief Complaint Chief Complaint  Patient presents with  . Joint Pain    HPI Brandon [name] is a 76 y.o. male.  Pt presents to the ED today with diffuse joint pain.  Pt said that he normally has joint pain with his arthritis, but this is much worse.  Sx have been going on since 5/9.  Pt did see his pcp and was dx'd with a viral infection.  Pt put on meloxicam which has helped.  Pt is having chills and sweats also.  He denies sob or cp. He said his skin feels clammy.      Past Medical History:  Diagnosis Date  . Aortic stenosis    mild echocardiogram 8/09 EF 60%, showed no regional wall motion abnml, mild LVH, mod focal basal septal hypertrophy and mild dyastolic dysfunction. 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 mean gradient across aortic valve was 8 mmHG. also mild L atrial enlargement and normal RV size and function. minimal AS on LHC in 7/10.   Marland Kitchen BPH (benign prostatic hypertrophy)   . CAD (coronary artery disease)    LCH (7/10) totally occluded proximal RCA with very robuse L to R collaterals, 50% proximal LAD stenosis, EF 65%, medical management.    . Chicken pox   . Diverticulosis   . Esophageal stricture   . GERD (gastroesophageal reflux disease)   . Hiatal hernia   . HLD (hyperlipidemia)   . HTN (hypertension)   . Low back pain    s/p surgical fusion  . Osteoarthritis   . Smoker   . TIA (transient ischemic attack)    hx  . Tubular adenoma of colon 2009    Patient Active Problem List   Diagnosis Date Noted  . Encounter for annual physical exam 02/23/2016  . Tobacco abuse disorder 01/10/2014  . Seasonal allergies 11/23/2013  . Hypertension 05/19/2013  . Osteoarthritis 05/29/2009  . 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 CAD, NATIVE VESSEL 09/15/2008  . Aortic valve disorder 01/28/2008  . BRAIN STEM STROKE 08/27/2007  .  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 BENIGN PROSTATIC HYPERTROPHY, HX OF 08/27/2007  . Hyperlipidemia 03/19/2007  . ANXIETY 03/19/2007  . GERD 03/19/2007    Past Surgical History:  Procedure Laterality Date  . cataract surgery  01/2012/02/2012   both eyes/lens implanted  . CHOLECYSTECTOMY  1994  . L4-5 discectomy  4/98   Dr. Sherwood Gambler   . lumbar decompression and Ray cage  8/98   L4-5 Dr. Rita Ohara  . LUMBAR LAMINECTOMY  9/04   and microdiscectomy. L3-4. Dr. Sherwood Gambler  . MOUTH SURGERY     FULL DENTURES       Home Medications    Prior to Admission medications   Medication Sig Start Date End Date Taking? Authorizing Provider  amLODipine (NORVASC) 5 MG tablet Take 1 tablet (5 mg total) by mouth daily. *Patient is overdue for an appointment and needs to call and schedule for further refills* 05/14/16   Larey Dresser, MD  aspirin 81 MG EC tablet Take 81 mg by mouth daily.      [provider]  atorvastatin (LIPITOR) 40 MG tablet Take 40 mg by mouth daily.    [provider]  benazepril (LOTENSIN) 20 MG tablet TAKE 1 TABLET BY MOUTH DAILY 05/14/16   Brunetta Jeans, PA-C  cholecalciferol (VITAMIN D)  1000 UNITS tablet Take 1,000 Units by mouth as directed.     [provider]  doxycycline (VIBRAMYCIN) 100 MG capsule Take 1 capsule (100 mg total) by mouth 2 (two) times daily. 06/16/16   Isla Pence, MD  fluticasone (FLONASE) 50 MCG/ACT nasal spray Place 2 sprays into both nostrils daily as needed. 12/07/14   Brunetta Jeans, PA-C  HYDROcodone-acetaminophen (NORCO/VICODIN) 5-325 MG tablet Take 0.5-1 tablets by mouth every 6 (six) hours as needed for moderate pain. 05/22/16   Brunetta Jeans, PA-C  isosorbide mononitrate (IMDUR) 30 MG 24 hr tablet TAKE 1 TABLET BY MOUTH AT BEDTIME 04/12/16   Brunetta Jeans, PA-C  meloxicam (MOBIC) 7.5 MG tablet Take 1 tablet (7.5 mg total) by mouth daily. 06/13/16   Brunetta Jeans, PA-C  metoprolol (LOPRESSOR) 50 MG tablet  TAKE 1 TABLET BY MOUTH TWICE DAILY 04/13/16   Brunetta Jeans, PA-C  Omega-3 Fatty Acids (FISH OIL) 1000 MG CAPS Take 2 capsules by mouth 2 (two) times daily.      [provider]  omeprazole (PRILOSEC) 20 MG capsule TAKE 1 CAPSULE(20 MG) BY MOUTH DAILY 05/10/16   Ladene Artist, MD  oxyCODONE-acetaminophen (PERCOCET/ROXICET) 5-325 MG tablet Take 2 tablets by mouth every 4 (four) hours as needed for severe pain. 06/16/16   Isla Pence, MD  vitamin B-12 (CYANOCOBALAMIN) 1000 MCG tablet Take 1,000 mcg by mouth daily.      [provider]    Family History Family History  Problem Relation Age of Onset  . Heart disease Father 36       Living  . Coronary artery disease Father        CABG  . Alzheimer's disease Mother 60       Deceased  . Arthritis/Rheumatoid Mother   . Stomach cancer Maternal Uncle   . Brain cancer Maternal Aunt        x2  . Arthritis Brother        DJD  . Obesity Daughter        Had Bypass Sx    Social History Social History  Substance Use Topics  . Smoking status: Former Smoker    Packs/day: 1.50    Years: 50.00    Types: Cigarettes    Quit date: 06/2015  . Smokeless tobacco: Former Systems developer     Comment: smoked less than 1 ppd for 40+ years; Using Administrator, sports  . Alcohol use No     Comment: social      Allergies   Celecoxib   Review of Systems Review of Systems  Constitutional: Positive for chills, diaphoresis and fatigue.  Musculoskeletal: Positive for arthralgias and myalgias.  All other systems reviewed and are negative.    Physical Exam Updated Vital Signs BP (!) 123/100   Pulse 73   Temp 98.1 F (36.7 C) (Oral)   Resp (!) 21   Ht 5\' 10"  (1.778 m)   Wt 178 lb (80.7 kg)   SpO2 98%   BMI 25.54 kg/m   Physical Exam  Constitutional: He is oriented to person, place, and time. He appears well-developed and well-nourished.  HENT:  Head: Normocephalic and atraumatic.  Right Ear: External ear normal.  Left Ear: External ear  normal.  Nose: Nose normal.  Mouth/Throat: Mucous membranes are dry.  Eyes: Conjunctivae and EOM are normal. Pupils are equal, round, and reactive to light.  Neck: Normal range of motion. Neck supple.  Cardiovascular: Regular rhythm and intact distal pulses.  Tachycardia present.  Murmur heard. Pulmonary/Chest: Effort normal and breath sounds normal.  Abdominal: Soft. Bowel sounds are normal.  Musculoskeletal: Normal range of motion.  Neurological: He is alert and oriented to person, place, and time.  Skin: Skin is warm. He is diaphoretic.  Psychiatric: He has a normal mood and affect. His behavior is normal. Judgment and thought content normal.  Nursing note and vitals reviewed.    ED Treatments / Results  Labs (all labs ordered are listed, but only abnormal results are displayed) Labs Reviewed  COMPREHENSIVE METABOLIC PANEL - Abnormal; Notable for the following:       Result Value   Potassium 3.3 (*)    Glucose, Bld 171 (*)    Calcium 8.6 (*)    Albumin 3.4 (*)    All other components within normal limits  CBC WITH DIFFERENTIAL/PLATELET - Abnormal; Notable for the following:    Platelets 143 (*)    Neutro Abs 7.9 (*)    All other components within normal limits  URINALYSIS, ROUTINE W REFLEX MICROSCOPIC - Abnormal; Notable for the following:    Glucose, UA 150 (*)    Hgb urine dipstick SMALL (*)    Protein, ur 30 (*)    All other components within normal limits  LIPASE, BLOOD  TROPONIN I  ROCKY MTN SPOTTED FVR ABS PNL(IGG+IGM)  B. BURGDORFI ANTIBODIES  I-STAT CG4 LACTIC ACID, ED    EKG  EKG Interpretation  Date/Time:  Saturday Jun 16 2016 09:26:10 EDT Ventricular Rate:  91 PR Interval:    QRS Duration: 93 QT Interval:  345 QTC Calculation: 425 R Axis:   59 Text Interpretation:  Sinus rhythm Atrial premature complex Minimal ST depression, lateral leads No significant change since last tracing Confirmed by [facility] MD, Thana Ramp ([id number]) on 06/16/2016 9:30:52 AM         Radiology Dg Chest 2 View  Result Date: 06/16/2016 CLINICAL DATA:  Joint pain and swelling, nausea, aortic stenosis. EXAM: CHEST  2 VIEW COMPARISON:  04/17/2013. FINDINGS: Trachea is midline. Heart size normal. Thoracic aorta is calcified. Lungs are clear. No pleural fluid. Mild degenerative changes are seen in the spine. IMPRESSION: 1. No acute findings. 2.  Aortic atherosclerosis (ICD10-170.0). Electronically Signed   By: Lorin Picket M.D.   On: 06/16/2016 10:28    Procedures Procedures (including critical care time)  Medications Ordered in ED Medications  sodium chloride 0.9 % bolus 1,000 mL (0 mLs Intravenous Stopped 06/16/16 1047)     Initial Impression / Assessment and Plan / ED Course  I have reviewed the triage vital signs and the nursing notes.  Pertinent labs & imaging results that were available during my care of the patient were reviewed by me and considered in my medical decision making (see chart for details).    Pt is feeling much better.  He looks much better.  Pt will be treated for RMSF in case he has that.  Ab pending.  Pt knows to return if worse and f/u with pcp.   Final Clinical Impressions(s) / ED Diagnoses   Final diagnoses:  Polyarthralgia    New Prescriptions New Prescriptions   DOXYCYCLINE (VIBRAMYCIN) 100 MG CAPSULE    Take 1 capsule (100 mg total) by mouth 2 (two) times daily.   OXYCODONE-ACETAMINOPHEN (PERCOCET/ROXICET) 5-325 MG TABLET    Take 2 tablets by mouth every 4 (four) hours as needed for severe pain.     Isla Pence, MD 06/16/16 1155

## 2016-06-16 NOTE — ED Notes (Signed)
Patient reminded that a urine specimen was needed. 

## 2016-06-18 LAB — B. BURGDORFI ANTIBODIES: B burgdorferi Ab IgG+IgM: <0.91 {ISR} (ref 0.00–0.90)

## 2016-06-18 NOTE — ED Notes (Signed)
Pt. Called and asked for results for his Blood work [facility] Fever.  They have not resulted.  Explained to him that if they come back positive , I will notify him.

## 2016-06-19 LAB — ROCKY MTN SPOTTED FVR ABS PNL(IGG+IGM)
RMSF IGM: 0.42 {index} (ref 0.00–0.89)
RMSF IgG: NEGATIVE

## 2016-06-23 ENCOUNTER — Other Ambulatory Visit: Payer: Self-pay | Admitting: Cardiology

## 2016-06-25 ENCOUNTER — Other Ambulatory Visit: Payer: Self-pay | Admitting: Cardiology

## 2016-07-03 ENCOUNTER — Ambulatory Visit (INDEPENDENT_AMBULATORY_CARE_PROVIDER_SITE_OTHER): Payer: Medicare Other | Admitting: Gastroenterology

## 2016-07-03 ENCOUNTER — Encounter: Payer: Self-pay | Admitting: Gastroenterology

## 2016-07-03 VITALS — BP 134/72 | HR 72 | Ht 70.0 in | Wt 170.0 lb

## 2016-07-03 DIAGNOSIS — R1314 Dysphagia, pharyngoesophageal phase: Secondary | ICD-10-CM

## 2016-07-03 DIAGNOSIS — Z86010 Personal history of colonic polyps: Secondary | ICD-10-CM

## 2016-07-03 DIAGNOSIS — K219 Gastro-esophageal reflux disease without esophagitis: Secondary | ICD-10-CM

## 2016-07-03 MED ORDER — OMEPRAZOLE 20 MG PO CPDR: DELAYED_RELEASE_CAPSULE | ORAL | 11 refills | Status: DC

## 2016-07-03 NOTE — Progress Notes (Signed)
    History of Present Illness: This is a 76 year old male here for follow up of GERD with a history of an esophageal stricture. Recent colonoscopy and EGD results below. She relates his reflux symptoms are under very good control and he has no dysphagia. He feels that dilation performed last year was very effective.  He has several other concerns that he wished to review with me mainly centering around what he describes as chronic arthritis problems in his lower back, cervical spine and hips. He was also concerned about a recent illness with fevers. He was evaluated in the ED and definite cause was not uncovered. He states he no longer has fevers. He states he lost his appetite and notes a 10 pounds with this illness but his appetite has returned to normal and he feels his weight has stabilized. He has an appointment with his PCP coming up in one week.  ENDOSCOPIC IMPRESSION: 1. Two sessile polyps in the sigmoid colon and transverse colon; polypectomies performed with a cold snare 2. Moderate diverticulosis in the sigmoid colon and descending colon 3. Mild diverticulosis in the transverse colon 4. Grade l internal hemorrhoids  ENDOSCOPIC IMPRESSION: 1. Short stricture at the gastroesophageal junction; dilated savary dilators over guidewire 2. Small hiatal hernia  Current Medications, Allergies, Past Medical History, Past Surgical History, Family History and Social History were reviewed in [employer] record.  Physical Exam: General: Well developed, well nourished, no acute distress Head: Normocephalic and atraumatic Eyes:  sclerae anicteric, EOMI Ears: Normal auditory acuity Mouth: No deformity or lesions Lungs: Clear throughout to auscultation Heart: Regular rate and rhythm; no murmurs, rubs or bruits Abdomen: Soft, non tender and non distended. No masses, hepatosplenomegaly or hernias noted. Normal Bowel sounds Musculoskeletal: Symmetrical with no gross deformities   Pulses:  Normal pulses noted Extremities: No clubbing, cyanosis, edema or deformities noted Neurological: Alert oriented x 4, grossly nonfocal Psychological:  Alert and cooperative. Normal mood and affect  Assessment and Recommendations:  1. GERD with a history of a peptic stricture. No dysphagia. Reflux symptoms well controlled. Continue omeprazole 20 mg daily and follow standard antireflux measures.  2. Personal history of adenomatous colon polyps. Five-year interval colonoscopy is recommended in 03/2020.  3. Joint symptoms, spine symptoms, recent febrile illness, weight loss which has stablized. Advised him to review all these complaints with his PCP at his upcoming appointment. I do not feel there is a gastrointestinal connection.  I spent 15 minutes of face-to-face time with the patient. Greater than 50% of the time was spent counseling and coordinating care.

## 2016-07-03 NOTE — Patient Instructions (Signed)
We have sent the following medications to your pharmacy for you to pick up at your convenience:omeprazole.  Thank you for choosing me and [facility].  Malcolm T. Stark, Jr., MD., FACG   

## 2016-07-04 ENCOUNTER — Ambulatory Visit (INDEPENDENT_AMBULATORY_CARE_PROVIDER_SITE_OTHER): Payer: Medicare Other | Admitting: Physician Assistant

## 2016-07-04 ENCOUNTER — Encounter: Payer: Self-pay | Admitting: Physician Assistant

## 2016-07-04 VITALS — BP 118/62 | HR 76 | Temp 99.2°F | Resp 14 | Ht 70.0 in | Wt 171.0 lb

## 2016-07-04 DIAGNOSIS — R509 Fever, unspecified: Secondary | ICD-10-CM | POA: Diagnosis not present

## 2016-07-04 LAB — COMPREHENSIVE METABOLIC PANEL
ALBUMIN: 3.6 g/dL (ref 3.5–5.2)
ALK PHOS: 173 U/L — AB (ref 39–117)
ALT: 18 U/L (ref 0–53)
AST: 17 U/L (ref 0–37)
BUN: 8 mg/dL (ref 6–23)
CALCIUM: 9 mg/dL (ref 8.4–10.5)
CHLORIDE: 102 meq/L (ref 96–112)
CO2: 30 mEq/L (ref 19–32)
Creatinine, Ser: 0.76 mg/dL (ref 0.40–1.50)
GFR: 106.63 mL/min (ref >60.00)
Glucose, Bld: 122 mg/dL — ABNORMAL HIGH (ref 70–99)
POTASSIUM: 4 meq/L (ref 3.5–5.1)
SODIUM: 138 meq/L (ref 135–145)
TOTAL PROTEIN: 6.4 g/dL (ref 6.0–8.3)
Total Bilirubin: 0.7 mg/dL (ref 0.2–1.2)

## 2016-07-04 LAB — CBC WITH DIFFERENTIAL/PLATELET
BASOS PCT: 0.3 % (ref 0.0–3.0)
Basophils Absolute: 0 10*3/uL (ref 0.0–0.1)
EOS PCT: 0.6 % (ref 0.0–5.0)
Eosinophils Absolute: 0.1 10*3/uL (ref 0.0–0.7)
HCT: 39.1 % (ref 39.0–52.0)
HEMOGLOBIN: 13 g/dL (ref 13.0–17.0)
LYMPHS ABS: 1.1 10*3/uL (ref 0.7–4.0)
Lymphocytes Relative: 13.1 % (ref 12.0–46.0)
MCHC: 33.1 g/dL (ref 30.0–36.0)
MCV: 84 fl (ref 78.0–100.0)
MONO ABS: 0.6 10*3/uL (ref 0.1–1.0)
MONOS PCT: 6.6 % (ref 3.0–12.0)
Neutro Abs: 6.8 10*3/uL (ref 1.4–7.7)
Neutrophils Relative %: 79.4 % — ABNORMAL HIGH (ref 43.0–77.0)
Platelets: 216 10*3/uL (ref 150.0–400.0)
RBC: 4.65 Mil/uL (ref 4.22–5.81)
RDW: 14.1 % (ref 11.5–15.5)
WBC: 8.5 10*3/uL (ref 4.0–10.5)

## 2016-07-04 LAB — POCT URINALYSIS DIPSTICK
Bilirubin, UA: NEGATIVE
Blood, UA: NEGATIVE
GLUCOSE UA: NEGATIVE
Ketones, UA: NEGATIVE
Leukocytes, UA: NEGATIVE
NITRITE UA: NEGATIVE
PH UA: 7.5 (ref 5.0–8.0)
SPEC GRAV UA: 1.01 (ref 1.010–1.025)
Urobilinogen, UA: 1 E.U./dL

## 2016-07-04 LAB — TSH: TSH: 0.95 u[IU]/mL (ref 0.35–4.50)

## 2016-07-04 LAB — SEDIMENTATION RATE: SED RATE: 33 mm/h — AB (ref 0–20)

## 2016-07-04 NOTE — Progress Notes (Signed)
Pre visit review using our clinic review tool, if applicable. No additional management support is needed unless otherwise documented below in the visit note. 

## 2016-07-04 NOTE — Progress Notes (Signed)
Patient presents to clinic today c/o low-grade fever starting last night. States he has otherwise felt fine but is concerned as he states he keeps having episodes of unexplainable fever. First incident on 06/16/16 with fever, chills and aches. Went to the ER on that same day. Workup included unremarkable labs and CXR. Patient was started empirically on Doxycycline in case of tick-borne illness while labs pending. Rickettsial labs unremarkable. Doxycycline was stopped at time of result. After 1.5 weeks patient notes all symptoms resolved. Felt fine until 5/22 when he noted some very mild aches and fatigue lasting a couple of days. States this resolved on its own until symptoms started last night. Denies aches or chills at present. Denies chest pain, racing heart, LH/dizziness, nausea/vomitinig, abdominal pain or change to bowel/bladder habits. Denies recent foreign travel or sick contact.   Past Medical History:  Diagnosis Date  . Aortic stenosis    mild echocardiogram 8/09 EF 60%, showed no regional wall motion abnml, mild LVH, mod focal basal septal hypertrophy and mild dyastolic dysfunction. 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 mean gradient across aortic valve was 8 mmHG. also mild L atrial enlargement and normal RV size and function. minimal AS on LHC in 7/10.   . Arthritis   . BPH (benign prostatic hypertrophy)   . CAD (coronary artery disease)    LCH (7/10) totally occluded proximal RCA with very robuse L to R collaterals, 50% proximal LAD stenosis, EF 65%, medical management.    . Chicken pox   . Diverticulosis   . Esophageal stricture   . GERD (gastroesophageal reflux disease)   . Hiatal hernia   . HLD (hyperlipidemia)   . HTN (hypertension)   . Low back pain    s/p surgical fusion  . Osteoarthritis   . Smoker   . TIA (transient ischemic attack)    hx  . Tubular adenoma of colon 2009    Current Outpatient Prescriptions on File Prior  to Visit  Medication Sig Dispense Refill  . amLODipine (NORVASC) 5 MG tablet TAKE 1 TABLET(5 MG) BY MOUTH DAILY 15 tablet 0  . aspirin 81 MG EC tablet Take 81 mg by mouth daily.      Marland Kitchen atorvastatin (LIPITOR) 40 MG tablet Take 40 mg by mouth daily.    . benazepril (LOTENSIN) 20 MG tablet TAKE 1 TABLET BY MOUTH DAILY 90 tablet 1  . cholecalciferol (VITAMIN D) 1000 UNITS tablet Take 1,000 Units by mouth as directed.     . fluticasone (FLONASE) 50 MCG/ACT nasal spray Place 2 sprays into both nostrils daily as needed. 48 g 1  . HYDROcodone-acetaminophen (NORCO/VICODIN) 5-325 MG tablet Take 0.5-1 tablets by mouth every 6 (six) hours as needed for moderate pain. 90 tablet 0  . isosorbide mononitrate (IMDUR) 30 MG 24 hr tablet TAKE 1 TABLET BY MOUTH AT BEDTIME 90 tablet 1  . metoprolol (LOPRESSOR) 50 MG tablet TAKE 1 TABLET BY MOUTH TWICE DAILY 180 tablet 0  . Omega-3 Fatty Acids (FISH OIL) 1000 MG CAPS Take 2 capsules by mouth 2 (two) times daily.      Marland Kitchen omeprazole (PRILOSEC) 20 MG capsule TAKE 1 CAPSULE(20 MG) BY MOUTH DAILY 30 capsule 11  . vitamin B-12 (CYANOCOBALAMIN) 1000 MCG tablet Take 1,000 mcg by mouth daily.       No current facility-administered medications on file prior to visit.     Allergies  Allergen Reactions  . Celecoxib     REACTION:  hives and itching    Family History  Problem Relation Age of Onset  . Heart disease Father 75       Living  . Coronary artery disease Father        CABG  . Alzheimer's disease Mother 39       Deceased  . Arthritis/Rheumatoid Mother   . Stomach cancer Maternal Uncle   . Brain cancer Maternal Aunt        x2  . Arthritis Brother        DJD  . Obesity Daughter        Had Bypass Sx    Social History   Social History  . Marital status: Married    Spouse name: etta  . Number of children: 2  . Years of education: N/A   Occupational History  . retired Retired    disabled due to back problems   Social History Main Topics  . Smoking  status: Former Smoker    Packs/day: 1.50    Years: 50.00    Types: Cigarettes    Quit date: 06/2015  . Smokeless tobacco: Former Systems developer     Comment: smoked less than 1 ppd for 40+ years; Using Administrator, sports  . Alcohol use No     Comment: social   . Drug use: No  . Sexual activity: Not Asked   Other Topics Concern  . None   Social History Narrative   Married (3rd), [place]. 2 children from 1st marriage, 4 step children.    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       Cell # [id number]   Review of Systems - See HPI.  All other ROS are negative.  BP 118/62   Pulse 76   Temp 99.2 F (37.3 C) (Oral)   Resp 14   Ht '5\' 10"'  (1.778 m)   Wt 171 lb (77.6 kg)   SpO2 96%   BMI 24.54 kg/m   Physical Exam  Constitutional: He is oriented to person, place, and time and well-developed, well-nourished, and in no distress.  HENT:  Head: Normocephalic and atraumatic.  Right Ear: External ear normal.  Left Ear: External ear normal.  Nose: Nose normal.  Mouth/Throat: Oropharynx is clear and moist. No oropharyngeal exudate.  TM within normal limits bilaterally.  Eyes: Conjunctivae are normal.  Neck: Neck supple.  Cardiovascular: Normal rate, regular rhythm, normal heart sounds and intact distal pulses.   Pulmonary/Chest: Effort normal and breath sounds normal. No respiratory distress. He has no wheezes. He has no rales. He exhibits no tenderness.  Abdominal: Soft. Bowel sounds are normal. He exhibits no distension and no mass. There is no tenderness. There is no rebound and no guarding.  Musculoskeletal: Normal range of motion.  Lymphadenopathy:    He has no cervical adenopathy.  Neurological: He is alert and oriented to person, place, and time. No cranial nerve deficit.  Skin: Skin is warm and dry. No rash noted.  Psychiatric: Affect normal.  Vitals reviewed.  Recent Results (from the past 2160 hour(s))    Comprehensive metabolic panel     Status: Abnormal   Collection Time: 06/16/16  9:20 AM  Result Value Ref Range   Sodium 136 135 - 145 mmol/L   Potassium 3.3 (L) 3.5 - 5.1 mmol/L   Chloride 104 101 - 111 mmol/L   CO2 25 22 - 32 mmol/L   Glucose, Bld  171 (H) 65 - 99 mg/dL   BUN 9 6 - 20 mg/dL   Creatinine, Ser 0.82 0.61 - 1.24 mg/dL   Calcium 8.6 (L) 8.9 - 10.3 mg/dL   Total Protein 6.7 6.5 - 8.1 g/dL   Albumin 3.4 (L) 3.5 - 5.0 g/dL   AST 21 15 - 41 U/L   ALT 20 17 - 63 U/L   Alkaline Phosphatase 114 38 - 126 U/L   Total Bilirubin 0.6 0.3 - 1.2 mg/dL   GFR calc non Af Amer >60 >60 mL/min   GFR calc Af Amer >60 >60 mL/min    Comment: (NOTE) The eGFR has been calculated using the CKD EPI equation. This calculation has not been validated in all clinical situations. eGFR's persistently <60 mL/min signify possible Chronic Kidney Disease.    Anion gap 7 5 - 15  Lipase, blood     Status: None   Collection Time: 06/16/16  9:20 AM  Result Value Ref Range   Lipase 14 11 - 51 U/L  Troponin I     Status: None   Collection Time: 06/16/16  9:20 AM  Result Value Ref Range   Troponin I <0.03 <0.03 ng/mL  CBC with Differential     Status: Abnormal   Collection Time: 06/16/16  9:20 AM  Result Value Ref Range   WBC 9.8 4.0 - 10.5 K/uL   RBC 4.77 4.22 - 5.81 MIL/uL   Hemoglobin 13.6 13.0 - 17.0 g/dL   HCT 40.4 39.0 - 52.0 %   MCV 84.7 78.0 - 100.0 fL   MCH 28.5 26.0 - 34.0 pg   MCHC 33.7 30.0 - 36.0 g/dL   RDW 13.5 11.5 - 15.5 %   Platelets 143 (L) 150 - 400 K/uL   Neutrophils Relative % 80 %   Neutro Abs 7.9 (H) 1.7 - 7.7 K/uL   Lymphocytes Relative 11 %   Lymphs Abs 1.1 0.7 - 4.0 K/uL   Monocytes Relative 8 %   Monocytes Absolute 0.8 0.1 - 1.0 K/uL   Eosinophils Relative 1 %   Eosinophils Absolute 0.1 0.0 - 0.7 K/uL   Basophils Relative 0 %   Basophils Absolute 0.0 0.0 - 0.1 K/uL  Urinalysis, Routine w reflex microscopic     Status: Abnormal   Collection Time: 06/16/16  9:20  AM  Result Value Ref Range   Color, Urine YELLOW YELLOW   APPearance CLEAR CLEAR   Specific Gravity, Urine 1.020 1.005 - 1.030   pH 5.0 5.0 - 8.0   Glucose, UA 150 (A) NEGATIVE mg/dL   Hgb urine dipstick SMALL (A) NEGATIVE   Bilirubin Urine NEGATIVE NEGATIVE   Ketones, ur NEGATIVE NEGATIVE mg/dL   Protein, ur 30 (A) NEGATIVE mg/dL   Nitrite NEGATIVE NEGATIVE   Leukocytes, UA NEGATIVE NEGATIVE   RBC / HPF 0-5 0 - 5 RBC/hpf   WBC, UA 0-5 0 - 5 WBC/hpf   Bacteria, UA NONE SEEN NONE SEEN   Squamous Epithelial / LPF NONE SEEN NONE SEEN   Mucous PRESENT   Rocky mtn spotted fvr abs pnl(IgG+IgM)     Status: None   Collection Time: 06/16/16  9:20 AM  Result Value Ref Range   RMSF IgG Negative Negative   RMSF IgM 0.42 0.00 - 0.89 index    Comment: (NOTE)                                 Negative        <  0.90                                 Equivocal 0.90 - 1.10                                 Positive        >1.10 Performed At: [facility] [place], [place] [id number] Lindon Romp MD PT:[id number]   B. Claris Che antibodies     Status: None   Collection Time: 06/16/16  9:20 AM  Result Value Ref Range   B burgdorferi Ab IgG+IgM <0.91 0.00 - 0.90 ISR    Comment: (NOTE)                                Negative         <0.91                                Equivocal  0.91 - 1.09                                Positive         >1.09 Performed At: [facility] [place], [place] [id number] Lindon Romp MD HQ:[id number]   I-Stat CG4 Lactic Acid, ED     Status: None   Collection Time: 06/16/16  9:40 AM  Result Value Ref Range   Lactic Acid, Venous 1.76 0.5 - 1.9 mmol/L  CBC with Differential/Platelet     Status: Abnormal   Collection Time: 07/04/16 10:52 AM  Result Value Ref Range   WBC 8.5 4.0 - 10.5 K/uL   RBC 4.65 4.22 - 5.81 Mil/uL   Hemoglobin 13.0 13.0 - 17.0 g/dL   HCT 39.1 39.0 - 52.0 %   MCV 84.0 78.0 - 100.0 fl   MCHC 33.1  30.0 - 36.0 g/dL   RDW 14.1 11.5 - 15.5 %   Platelets 216.0 150.0 - 400.0 K/uL   Neutrophils Relative % 79.4 (H) 43.0 - 77.0 %   Lymphocytes Relative 13.1 12.0 - 46.0 %   Monocytes Relative 6.6 3.0 - 12.0 %   Eosinophils Relative 0.6 0.0 - 5.0 %   Basophils Relative 0.3 0.0 - 3.0 %   Neutro Abs 6.8 1.4 - 7.7 K/uL   Lymphs Abs 1.1 0.7 - 4.0 K/uL   Monocytes Absolute 0.6 0.1 - 1.0 K/uL   Eosinophils Absolute 0.1 0.0 - 0.7 K/uL   Basophils Absolute 0.0 0.0 - 0.1 K/uL  Comprehensive metabolic panel     Status: Abnormal   Collection Time: 07/04/16 10:52 AM  Result Value Ref Range   Sodium 138 135 - 145 mEq/L   Potassium 4.0 3.5 - 5.1 mEq/L   Chloride 102 96 - 112 mEq/L   CO2 30 19 - 32 mEq/L   Glucose, Bld 122 (H) 70 - 99 mg/dL   BUN 8 6 - 23 mg/dL   Creatinine, Ser 0.76 0.40 - 1.50 mg/dL   Total Bilirubin 0.7 0.2 - 1.2 mg/dL   Alkaline Phosphatase 173 (H) 39 - 117 U/L   AST 17 0 - 37 U/L   ALT 18 0 - 53 U/L  Total Protein 6.4 6.0 - 8.3 g/dL   Albumin 3.6 3.5 - 5.2 g/dL   Calcium 9.0 8.4 - 10.5 mg/dL   GFR 106.63 >60.00 mL/min  TSH     Status: None   Collection Time: 07/04/16 10:52 AM  Result Value Ref Range   TSH 0.95 0.35 - 4.50 uIU/mL  Sed Rate (ESR)     Status: Abnormal   Collection Time: 07/04/16 10:52 AM  Result Value Ref Range   Sed Rate 33 (H) 0 - 20 mm/hr  POCT Urinalysis Dipstick     Status: Abnormal   Collection Time: 07/04/16 10:53 AM  Result Value Ref Range   Color, UA yellow    Clarity, UA clear    Glucose, UA negative    Bilirubin, UA negative    Ketones, UA negative    Spec Grav, UA 1.010 1.010 - 1.025   Blood, UA negative    pH, UA 7.5 5.0 - 8.0   Protein, UA trace    Urobilinogen, UA 1.0 0.2 or 1.0 E.U./dL   Nitrite, UA negative    Leukocytes, UA Negative Negative   Assessment/Plan: 1. Intermittent FUO With intermittent myalgias. None present currently. T at 99.2 today untreated. Tmax at home at 100.4. Fevers mainly night and early morning. No  recent travel. Recent negative CXR, Lyme titer and RMSF testing. Exam unremarkable. Concern for viral or AI etiology. Urine dip today without sign of infection. Will check repeat CBC and CMP. Will also check TSH, ESR, CMV and EBV panels. No risk factors for HIV. Will alter regimen based on results. Supportive measures and OTC medications reviewed. Alarm signs/symptoms reviewed that would prompt ER assessment.   - CBC with Differential/Platelet - Comprehensive metabolic panel - TSH - CMV abs, IgG+IgM (cytomegalovirus) - Epstein-Barr Virus VCA Antibody Panel - Sed Rate (ESR) - POCT Urinalysis Dipstick    Leeanne Rio, PA-C

## 2016-07-04 NOTE — Patient Instructions (Signed)
Please go to the lab for blood work. I will call you with your results.  Please stay well hydrated and get plenty of rest.  Tylenol if needed for fever 101 or greater.   We will alter regimen based on results.   If you note any acute worsening of symptoms, please return immediately or go to the ER.

## 2016-07-05 ENCOUNTER — Telehealth: Payer: Self-pay | Admitting: Physician Assistant

## 2016-07-05 LAB — EPSTEIN-BARR VIRUS VCA ANTIBODY PANEL
EBV NA IgG: 261 U/mL — ABNORMAL HIGH
EBV VCA IgM: <36 U/mL

## 2016-07-05 NOTE — Telephone Encounter (Signed)
Wife calling about lab results, asking for a call back as soon as labs come in, pt still has a fever and they are uncertain on what they should do.

## 2016-07-05 NOTE — Telephone Encounter (Signed)
PCP annotated on lab results and advised patient of results. See result note

## 2016-07-06 ENCOUNTER — Other Ambulatory Visit: Payer: Self-pay | Admitting: Physician Assistant

## 2016-07-06 ENCOUNTER — Emergency Department (HOSPITAL_COMMUNITY)
Admission: EM | Admit: 2016-07-06 | Discharge: 2016-07-07 | Disposition: A | Discharge status: Home / Self Care | Payer: Medicare Other | Attending: Emergency Medicine | Admitting: Emergency Medicine

## 2016-07-06 ENCOUNTER — Emergency Department (HOSPITAL_COMMUNITY): Payer: Medicare Other

## 2016-07-06 ENCOUNTER — Encounter (HOSPITAL_COMMUNITY): Payer: Self-pay

## 2016-07-06 DIAGNOSIS — I2510 Atherosclerotic heart disease of native coronary artery without angina pectoris: Secondary | ICD-10-CM | POA: Diagnosis not present

## 2016-07-06 DIAGNOSIS — R509 Fever, unspecified: Secondary | ICD-10-CM | POA: Diagnosis not present

## 2016-07-06 DIAGNOSIS — I10 Essential (primary) hypertension: Secondary | ICD-10-CM | POA: Insufficient documentation

## 2016-07-06 DIAGNOSIS — Z85038 Personal history of other malignant neoplasm of large intestine: Secondary | ICD-10-CM | POA: Diagnosis not present

## 2016-07-06 DIAGNOSIS — Z87891 Personal history of nicotine dependence: Secondary | ICD-10-CM | POA: Insufficient documentation

## 2016-07-06 DIAGNOSIS — Z8673 Personal history of transient ischemic attack (TIA), and cerebral infarction without residual deficits: Secondary | ICD-10-CM | POA: Insufficient documentation

## 2016-07-06 DIAGNOSIS — Z7982 Long term (current) use of aspirin: Secondary | ICD-10-CM | POA: Diagnosis not present

## 2016-07-06 DIAGNOSIS — Z79899 Other long term (current) drug therapy: Secondary | ICD-10-CM | POA: Insufficient documentation

## 2016-07-06 DIAGNOSIS — R748 Abnormal levels of other serum enzymes: Secondary | ICD-10-CM

## 2016-07-06 LAB — URINALYSIS, ROUTINE W REFLEX MICROSCOPIC
BACTERIA UA: NONE SEEN
BILIRUBIN URINE: NEGATIVE
Glucose, UA: NEGATIVE mg/dL
KETONES UR: NEGATIVE mg/dL
LEUKOCYTES UA: NEGATIVE
NITRITE: NEGATIVE
Protein, ur: 30 mg/dL — AB
SPECIFIC GRAVITY, URINE: 1.019 (ref 1.005–1.030)
SQUAMOUS EPITHELIAL / LPF: NONE SEEN
pH: 5 (ref 5.0–8.0)

## 2016-07-06 LAB — COMPREHENSIVE METABOLIC PANEL
ALT: 28 U/L (ref 17–63)
ANION GAP: 10 (ref 5–15)
AST: 28 U/L (ref 15–41)
Albumin: 3.1 g/dL — ABNORMAL LOW (ref 3.5–5.0)
Alkaline Phosphatase: 196 U/L — ABNORMAL HIGH (ref 38–126)
BUN: 12 mg/dL (ref 6–20)
CALCIUM: 8.5 mg/dL — AB (ref 8.9–10.3)
CHLORIDE: 102 mmol/L (ref 101–111)
CO2: 25 mmol/L (ref 22–32)
CREATININE: 0.89 mg/dL (ref 0.61–1.24)
Glucose, Bld: 116 mg/dL — ABNORMAL HIGH (ref 65–99)
Potassium: 3.3 mmol/L — ABNORMAL LOW (ref 3.5–5.1)
SODIUM: 137 mmol/L (ref 135–145)
Total Bilirubin: 1.2 mg/dL (ref 0.3–1.2)
Total Protein: 7.1 g/dL (ref 6.5–8.1)

## 2016-07-06 LAB — CBC WITH DIFFERENTIAL/PLATELET
Basophils Absolute: 0 10*3/uL (ref 0.0–0.1)
Basophils Relative: 0 %
EOS ABS: 0 10*3/uL (ref 0.0–0.7)
Eosinophils Relative: 0 %
HCT: 37.5 % — ABNORMAL LOW (ref 39.0–52.0)
HEMOGLOBIN: 12.3 g/dL — AB (ref 13.0–17.0)
LYMPHS PCT: 7 %
Lymphs Abs: 1 10*3/uL (ref 0.7–4.0)
MCH: 27.6 pg (ref 26.0–34.0)
MCHC: 32.8 g/dL (ref 30.0–36.0)
MCV: 84.1 fL (ref 78.0–100.0)
MONOS PCT: 9 %
Monocytes Absolute: 1.1 10*3/uL — ABNORMAL HIGH (ref 0.1–1.0)
NEUTROS PCT: 84 %
Neutro Abs: 11 10*3/uL — ABNORMAL HIGH (ref 1.7–7.7)
Platelets: 165 10*3/uL (ref 150–400)
RBC: 4.46 MIL/uL (ref 4.22–5.81)
RDW: 13.5 % (ref 11.5–15.5)
WBC: 13 10*3/uL — AB (ref 4.0–10.5)

## 2016-07-06 LAB — I-STAT CG4 LACTIC ACID, ED: Lactic Acid, Venous: 1.23 mmol/L (ref 0.5–1.9)

## 2016-07-06 LAB — PROTIME-INR
INR: 1.25
Prothrombin Time: 15.7 seconds — ABNORMAL HIGH (ref 11.4–15.2)

## 2016-07-06 LAB — CMV ABS, IGG+IGM (CYTOMEGALOVIRUS): Cytomegalovirus Ab-IgG: 6.1 U/mL — ABNORMAL HIGH (ref <0.60)

## 2016-07-06 MED ORDER — ACETAMINOPHEN 325 MG PO TABS
650.0000 mg | ORAL_TABLET | Freq: Once | ORAL | Status: AC
  Administered 2016-07-06: 650 mg via ORAL
  Filled 2016-07-06: qty 2

## 2016-07-06 MED ORDER — ONDANSETRON HCL 4 MG/2ML IJ SOLN
4.0000 mg | Freq: Once | INTRAMUSCULAR | Status: AC
  Administered 2016-07-06: 4 mg via INTRAVENOUS
  Filled 2016-07-06: qty 2

## 2016-07-06 MED ORDER — SODIUM CHLORIDE 0.9 % IV BOLUS (SEPSIS)
2500.0000 mL | Freq: Once | INTRAVENOUS | Status: AC
  Administered 2016-07-06: 2500 mL via INTRAVENOUS

## 2016-07-06 MED ORDER — FENTANYL CITRATE (PF) 100 MCG/2ML IJ SOLN
100.0000 ug | INTRAMUSCULAR | Status: DC | PRN
  Administered 2016-07-06: 100 ug via INTRAVENOUS
  Filled 2016-07-06: qty 2

## 2016-07-06 NOTE — ED Triage Notes (Signed)
Patient arrives by EMS with complaints of fever 103 1 hour ago-pt declined tylenol because he feels nauseated. Patient told EMS this all started 5 weeks ago when he was treated for RMSF and placed on antibiotics. Patient states his fever started again 1 week ago and has not sought treatment until tonight.

## 2016-07-06 NOTE — ED Notes (Signed)
Patient states he saw his PCP's PA this past Wednesday and had blood work done

## 2016-07-06 NOTE — Telephone Encounter (Signed)
Spoke with patient and wife earlier today. Fever is being well-controlled with tylenol. No chills when fever is controlled. Denies other symptoms. Reviewed EBV and CMV results. 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 CMV with IgG > 5 x normal limit concerning for acute/recent infection. Giving symptoms at present would suspect active infection. Supportive measures and OTC medications reviewed. He is to follow-up for repeat labs this coming week to repeat LFTs. ER or follow-up in office if any new or worsening symptoms.

## 2016-07-06 NOTE — ED Notes (Signed)
Bed: HK74 Expected date:  Expected time:  Means of arrival:  Comments: EMS 76 yo male from home/fever for 5 days-103 temp/lack of appetite

## 2016-07-06 NOTE — ED Provider Notes (Signed)
[facility] DEPT Provider Note   CSN: [id number] Arrival date & time: 07/06/16  2103     History   Chief Complaint Chief Complaint  Patient presents with  . Fever 103    HPI Brandon [name] is a 76 y.o. male.  He presents for evaluation of fever for greater than 1 month, intermittent, and unresponsive to treatments.  He saw his PCP this week and had additional testing done which was reassuring.  His PCP advised him to come to the emergency department for worsening fever.  He states that when he takes Tylenol, the fever does not change.  He has had decreased appetite for 1 day.  He has had intermittent nausea, but currently does not have any.  He denies vomiting, diarrhea, constipation, dysuria, urinary frequency, hematuria.  He has nasal congestion and occasionally postnasal drainage, with subsequent cough.  He denies shortness of breath, or persistent coughing with sputum.  There is been no headache, ear pain, sore throat, neck pain.  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  He is taking his usual medications.  He was apparently treated for possible [facility] Spotted fever however he reports that the testing was negative.  There are no other known modifying factors.  HPI  Past Medical History:  Diagnosis Date  . Aortic stenosis    mild echocardiogram 8/09 EF 60%, showed no regional wall motion abnml, mild LVH, mod focal basal septal hypertrophy and mild dyastolic dysfunction. 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 mean gradient across aortic valve was 8 mmHG. also mild L atrial enlargement and normal RV size and function. minimal AS on LHC in 7/10.   . Arthritis   . BPH (benign prostatic hypertrophy)   . CAD (coronary artery disease)    LCH (7/10) totally occluded proximal RCA with very robuse L to R collaterals, 50% proximal LAD stenosis, EF 65%,  medical management.    . Chicken pox   . Diverticulosis   . Esophageal stricture   . GERD (gastroesophageal reflux disease)   . Hiatal hernia   . HLD (hyperlipidemia)   . HTN (hypertension)   . Low back pain    s/p surgical fusion  . Osteoarthritis   . Smoker   . TIA (transient ischemic attack)    hx  . Tubular adenoma of colon 2009    Patient Active Problem List   Diagnosis Date Noted  . Encounter for annual physical exam 02/23/2016  . Tobacco abuse disorder 01/10/2014  . Seasonal allergies 11/23/2013  . Hypertension 05/19/2013  . Osteoarthritis 05/29/2009  . 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 CAD, NATIVE VESSEL 09/15/2008  . Aortic valve disorder 01/28/2008  . BRAIN STEM STROKE 08/27/2007  . 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 BENIGN PROSTATIC HYPERTROPHY, HX OF 08/27/2007  . Hyperlipidemia 03/19/2007  . ANXIETY 03/19/2007  . GERD 03/19/2007    Past Surgical History:  Procedure Laterality Date  . cataract surgery  01/2012/02/2012   both eyes/lens implanted  . CHOLECYSTECTOMY  1994  . L4-5 discectomy  4/98   Dr. Sherwood Gambler   . lumbar decompression and Ray cage  8/98   L4-5 Dr. Rita Ohara  . LUMBAR LAMINECTOMY  9/04   and microdiscectomy. L3-4. Dr. Sherwood Gambler  . MOUTH SURGERY     FULL DENTURES       Home Medications    Prior to Admission  medications   Medication Sig Start Date End Date Taking? Authorizing Provider  acetaminophen (TYLENOL) 325 MG tablet Take 325 mg by mouth every 6 (six) hours as needed for fever.   Yes [provider]  amLODipine (NORVASC) 5 MG tablet TAKE 1 TABLET(5 MG) BY MOUTH DAILY 06/25/16  Yes Larey Dresser, MD  aspirin 81 MG EC tablet Take 81 mg by mouth daily.     Yes [provider]  atorvastatin (LIPITOR) 40 MG tablet Take 40 mg by mouth daily.   Yes [provider]  benazepril (LOTENSIN) 20 MG tablet TAKE 1 TABLET BY MOUTH DAILY 05/14/16  Yes Brunetta Jeans, PA-C  cholecalciferol (VITAMIN D) 1000 UNITS tablet Take  1,000 Units by mouth daily.    Yes [provider]  HYDROcodone-acetaminophen (NORCO/VICODIN) 5-325 MG tablet Take 0.5-1 tablets by mouth every 6 (six) hours as needed for moderate pain. 05/22/16  Yes Brunetta Jeans, PA-C  isosorbide mononitrate (IMDUR) 30 MG 24 hr tablet TAKE 1 TABLET BY MOUTH AT BEDTIME 04/12/16  Yes Brunetta Jeans, PA-C  metoprolol (LOPRESSOR) 50 MG tablet TAKE 1 TABLET BY MOUTH TWICE DAILY 04/13/16  Yes Brunetta Jeans, PA-C  Omega-3 Fatty Acids (FISH OIL) 1000 MG CAPS Take 2 capsules by mouth 2 (two) times daily.     Yes [provider]  omeprazole (PRILOSEC) 20 MG capsule TAKE 1 CAPSULE(20 MG) BY MOUTH DAILY 07/03/16  Yes Ladene Artist, MD  vitamin B-12 (CYANOCOBALAMIN) 1000 MCG tablet Take 1,000 mcg by mouth daily.     Yes [provider]  fluticasone (FLONASE) 50 MCG/ACT nasal spray Place 2 sprays into both nostrils daily as needed. Patient taking differently: Place 2 sprays into both nostrils daily as needed for allergies.  12/07/14   Brunetta Jeans, PA-C    Family History Family History  Problem Relation Age of Onset  . Heart disease Father 65       Living  . Coronary artery disease Father        CABG  . Alzheimer's disease Mother 55       Deceased  . Arthritis/Rheumatoid Mother   . Stomach cancer Maternal Uncle   . Brain cancer Maternal Aunt        x2  . Arthritis Brother        DJD  . Obesity Daughter        Had Bypass Sx    Social History Social History  Substance Use Topics  . Smoking status: Former Smoker    Packs/day: 1.50    Years: 50.00    Types: Cigarettes    Quit date: 06/2015  . Smokeless tobacco: Former Systems developer     Comment: smoked less than 1 ppd for 40+ years; Using Administrator, sports  . Alcohol use No     Comment: social      Allergies   Celecoxib   Review of Systems Review of Systems  All other systems reviewed and are negative.    Physical Exam Updated Vital Signs BP 131/66 (BP Location: Left  Arm)   Pulse 96   Temp 98.7 F (37.1 C) (Oral)   Resp 18   SpO2 94%   Physical Exam  Constitutional: He is oriented to person, place, and time. He appears well-developed and well-nourished. No distress.  HENT:  Head: Normocephalic and atraumatic.  Right Ear: External ear normal.  Left Ear: External ear normal.  Eyes: Conjunctivae and EOM are normal. Pupils are equal, round, and reactive to  light.  Neck: Normal range of motion and phonation normal. Neck supple.  Cardiovascular: Normal rate, regular rhythm and normal heart sounds.   Pulmonary/Chest: Effort normal and breath sounds normal. He exhibits no bony tenderness.  Abdominal: Soft. There is no tenderness.  Musculoskeletal: Normal range of motion.  Neurological: He is alert and oriented to person, place, and time. No cranial nerve deficit or sensory deficit. He exhibits normal muscle tone. Coordination normal.  No dysarthria, aphasia or nystagmus.  Skin: Skin is warm, dry and intact.  Psychiatric: He has a normal mood and affect. His behavior is normal. Judgment and thought content normal.  Nursing note and vitals reviewed.    ED Treatments / Results  Labs (all labs ordered are listed, but only abnormal results are displayed) Labs Reviewed  COMPREHENSIVE METABOLIC PANEL - Abnormal; Notable for the following:       Result Value   Potassium 3.3 (*)    Glucose, Bld 116 (*)    Calcium 8.5 (*)    Albumin 3.1 (*)    Alkaline Phosphatase 196 (*)    All other components within normal limits  CBC WITH DIFFERENTIAL/PLATELET - Abnormal; Notable for the following:    WBC 13.0 (*)    Hemoglobin 12.3 (*)    HCT 37.5 (*)    Neutro Abs 11.0 (*)    Monocytes Absolute 1.1 (*)    All other components within normal limits  PROTIME-INR - Abnormal; Notable for the following:    Prothrombin Time 15.7 (*)    All other components within normal limits  URINALYSIS, ROUTINE W REFLEX MICROSCOPIC - Abnormal; Notable for the following:     Color, Urine AMBER (*)    Hgb urine dipstick SMALL (*)    Protein, ur 30 (*)    All other components within normal limits  CULTURE, BLOOD (ROUTINE X 2)  CULTURE, BLOOD (ROUTINE X 2)  I-STAT CG4 LACTIC ACID, ED    EKG  EKG Interpretation None       Radiology Dg Chest 2 View  Result Date: 07/06/2016 CLINICAL DATA:  Acute onset of fever and nausea.  Initial encounter. EXAM: CHEST  2 VIEW COMPARISON:  Chest radiograph from 06/16/2016 FINDINGS: The lungs are well-aerated and clear. There is no evidence of focal opacification, pleural effusion or pneumothorax. The heart is normal in size; the mediastinal contour is within normal limits. No acute osseous abnormalities are seen. IMPRESSION: No acute cardiopulmonary process seen. Electronically Signed   By: Garald Balding M.D.   On: 07/06/2016 22:23    Procedures Procedures (including critical care time)  Medications Ordered in ED Medications  sodium chloride 0.9 % bolus 2,500 mL (0 mLs Intravenous Stopped 07/07/16 0052)  acetaminophen (TYLENOL) tablet 650 mg (650 mg Oral Given 07/06/16 2331)  ondansetron (ZOFRAN) injection 4 mg (4 mg Intravenous Given 07/06/16 2331)     Initial Impression / Assessment and Plan / ED Course  I have reviewed the triage vital signs and the nursing notes.  Pertinent labs & imaging results that were available during my care of the patient were reviewed by me and considered in my medical decision making (see chart for details).      Patient Vitals for the past 24 hrs:  BP Temp Temp src Pulse Resp SpO2  07/07/16 0122 131/66 98.7 F (37.1 C) Oral 96 18 94 %  07/07/16 0049 127/62 99.5 F (37.5 C) Oral 94 20 93 %  07/06/16 2210 - 98.6 F (37 C) Oral - - -  At D/C Reevaluation with update and discussion. After initial assessment and treatment, an updated evaluation reveals he remains comfortable and has no further c/o. Carrissa Taitano L    Final Clinical Impressions(s) / ED Diagnoses   Final diagnoses:    Febrile illness    FUO in nontoxic patient. OP evaluation in progress. No indication for hospitalization at this time. Doubt meningitis, sepsis, or impending vascular collapse.  Nursing Notes Reviewed/ Care Coordinated Applicable Imaging Reviewed Interpretation of Laboratory Data incorporated into ED treatment  The patient appears reasonably screened and/or stabilized for discharge and I doubt any other medical condition or other [facility] requiring further screening, evaluation, or treatment in the ED at this time prior to discharge.  Plan: Home Medications- APAP q 4 hour; Home Treatments- rest, fluids; return here if the recommended treatment, does not improve the symptoms; Recommended follow up- PCP for OP evaluation of FUO- consider MR soine and cardiac ECHO   New Prescriptions Discharge Medication List as of 07/07/2016  1:03 AM       Daleen Bo, MD 07/07/16 2150

## 2016-07-06 NOTE — ED Notes (Signed)
Pt conti uesd

## 2016-07-06 NOTE — Telephone Encounter (Signed)
Wife calling back and would like Cody to call pt sometime today if possible, pt still has a fever and not feeling any better and would like to know what he should do. They know that we are still wanting on some labs to come in.

## 2016-07-07 NOTE — Discharge Instructions (Signed)
The testing today is reassuring.  However, no cause has been found for your fever.  It is important to continue drinking fluids, and try to eat some.  Try taking Tylenol 4 hours to help control shaking and sweating symptoms.  The next step in your evaluation includes further testing such as cardiac echo, and MRI of the back.  These tests can be done as an outpatient.   Return here, if needed, for problems including inability to eat and drink, progressive symptoms or other concerns.

## 2016-07-09 ENCOUNTER — Encounter: Payer: Self-pay | Admitting: Family Medicine

## 2016-07-09 ENCOUNTER — Ambulatory Visit (INDEPENDENT_AMBULATORY_CARE_PROVIDER_SITE_OTHER): Payer: Medicare Other | Admitting: Family Medicine

## 2016-07-09 ENCOUNTER — Other Ambulatory Visit: Payer: Self-pay | Admitting: Cardiology

## 2016-07-09 VITALS — BP 118/68 | HR 77 | Temp 98.0°F | Resp 16 | Ht 70.0 in | Wt 168.1 lb

## 2016-07-09 DIAGNOSIS — R509 Fever, unspecified: Secondary | ICD-10-CM | POA: Diagnosis not present

## 2016-07-09 DIAGNOSIS — R6881 Early satiety: Secondary | ICD-10-CM | POA: Diagnosis not present

## 2016-07-09 LAB — HEPATIC FUNCTION PANEL
ALBUMIN: 2.9 g/dL — AB (ref 3.5–5.2)
ALT: 86 U/L — AB (ref 0–53)
AST: 77 U/L — AB (ref 0–37)
Alkaline Phosphatase: 247 U/L — ABNORMAL HIGH (ref 39–117)
Bilirubin, Direct: 0.8 mg/dL — ABNORMAL HIGH (ref 0.0–0.3)
Total Bilirubin: 1.3 mg/dL — ABNORMAL HIGH (ref 0.2–1.2)
Total Protein: 5.5 g/dL — ABNORMAL LOW (ref 6.0–8.3)

## 2016-07-09 LAB — CBC WITH DIFFERENTIAL/PLATELET
Basophils Absolute: 0 10*3/uL (ref 0.0–0.1)
Basophils Relative: 0.1 % (ref 0.0–3.0)
EOS PCT: 0.3 % (ref 0.0–5.0)
Eosinophils Absolute: 0 10*3/uL (ref 0.0–0.7)
HCT: 35.7 % — ABNORMAL LOW (ref 39.0–52.0)
HEMOGLOBIN: 11.7 g/dL — AB (ref 13.0–17.0)
LYMPHS ABS: 1.3 10*3/uL (ref 0.7–4.0)
Lymphocytes Relative: 10 % — ABNORMAL LOW (ref 12.0–46.0)
MCHC: 32.7 g/dL (ref 30.0–36.0)
MCV: 83 fl (ref 78.0–100.0)
Monocytes Absolute: 0.5 10*3/uL (ref 0.1–1.0)
Monocytes Relative: 4 % (ref 3.0–12.0)
NEUTROS PCT: 85.6 % — AB (ref 43.0–77.0)
Neutro Abs: 11.5 10*3/uL — ABNORMAL HIGH (ref 1.4–7.7)
Platelets: 115 10*3/uL — ABNORMAL LOW (ref 150.0–400.0)
RBC: 4.3 Mil/uL (ref 4.22–5.81)
RDW: 15 % (ref 11.5–15.5)
WBC: 13.4 10*3/uL — ABNORMAL HIGH (ref 4.0–10.5)

## 2016-07-09 NOTE — Progress Notes (Signed)
   Subjective:    Patient ID: Brandon [name], male    DOB: 1940/06/06, 76 y.o.   MRN: [id number]  HPI ER f/u- pt went to ER on 6/1 for FUO.  This has been an ongoing issue for pt.  Wife reports pt had to change his shirt 8x yesterday due to sweating.  Tm 103.7.  Pt reports he has been feeling bad since 5/6 (date varies based on review of previous notes).  At initial visit, pt was unable to lie on R side due to hip pain.  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  When pt was seen in ER on 5/12 he was treated for RMSF despite no hx of tick bite.  Titers were subsequently normal and Doxy was stopped.  Pt reports subsequent tick bites after stopping abx.  Pt denies HA, rash- but had hives 'from his toes to his head' yesterday.  Improved w/ benadryl.  Denies nausea.  Pt's CMV level is >10x upper limit of normal.  Pt only feels poorly when fever is high.   Review of Systems For ROS see HPI     Objective:   Physical Exam  Constitutional: He is oriented to person, place, and time. He appears well-developed and well-nourished. No distress.  HENT:  Head: Normocephalic and atraumatic.  Eyes: Conjunctivae and EOM are normal. Pupils are equal, round, and reactive to light.  Neck: Normal range of motion. Neck supple. No thyromegaly present.  Cardiovascular: Normal rate, regular rhythm, normal heart sounds and intact distal pulses.   No murmur heard. Pulmonary/Chest: Effort normal and breath sounds normal. No respiratory distress.  Abdominal: Soft. Bowel sounds are normal. He exhibits no distension. There is no tenderness. There is no rebound and no guarding.  Musculoskeletal: He exhibits no edema.  Lymphadenopathy:    He has no cervical adenopathy.  Neurological: He is alert and oriented to person, place, and time. No cranial nerve deficit.  Skin: Skin is warm and dry.  Sallow, almost jaundiced appearing  Psychiatric: He has a normal mood and affect. His behavior is normal.    Vitals reviewed.         Assessment & Plan:  FUO- pt reports sxs for ~1 month.  Has seen PCP x2 and ER x2.  Today is 5th visit for this issue.  Today he relays that he has hx of Hep B (not previously noted in chart) and that he has had early satiety, nausea, and intermittent abdominal pain x6 weeks.  This is very concerning for an abdominal/biliary process.  Will get CT to assess.  Pt is again afebrile in office but wife reports soaking sweats and chills yesterday.  Repeat labs as pt reports he had tick exposure after his last labs were drawn- he seems fixated on tick borne illness although this is low on my differential at this time.  Reviewed + CMV test w/ pt- he and wife had no recollection of this discussion w/ PCP.  Reviewed that if sxs worsen while waiting on CT, he will need to go back to ER.  Pt expressed understanding and is in agreement w/ plan.

## 2016-07-09 NOTE — Patient Instructions (Signed)
Follow up as needed/scheduled We'll notify you of your lab results and make any changes if needed Continue to take tylenol every 4-6 hrs to stay ahead of the fevers Drink plenty of fluids since you are not eating very much We will call you with your Infectious Disease appt (fever specialists) We will call you with your CT appt Call with any questions or concerns Hang in there!!!

## 2016-07-09 NOTE — Progress Notes (Signed)
Pre visit review using our clinic review tool, if applicable. No additional management support is needed unless otherwise documented below in the visit note. 

## 2016-07-10 ENCOUNTER — Emergency Department (HOSPITAL_COMMUNITY): Payer: Medicare Other

## 2016-07-10 ENCOUNTER — Inpatient Hospital Stay (HOSPITAL_COMMUNITY)
Admission: EM | Admit: 2016-07-10 | Discharge: 2016-07-16 | DRG: 871 | Disposition: A | Discharge status: Home Health | Payer: Medicare Other | Attending: Internal Medicine | Admitting: Internal Medicine

## 2016-07-10 ENCOUNTER — Inpatient Hospital Stay (HOSPITAL_COMMUNITY): Payer: Medicare Other

## 2016-07-10 ENCOUNTER — Encounter (HOSPITAL_COMMUNITY): Payer: Self-pay | Admitting: Obstetrics and Gynecology

## 2016-07-10 DIAGNOSIS — Z7982 Long term (current) use of aspirin: Secondary | ICD-10-CM

## 2016-07-10 DIAGNOSIS — N400 Enlarged prostate without lower urinary tract symptoms: Secondary | ICD-10-CM | POA: Diagnosis present

## 2016-07-10 DIAGNOSIS — I81 Portal vein thrombosis: Secondary | ICD-10-CM | POA: Diagnosis not present

## 2016-07-10 DIAGNOSIS — Z6824 Body mass index (BMI) 24.0-24.9, adult: Secondary | ICD-10-CM

## 2016-07-10 DIAGNOSIS — Z800 Family history of malignant neoplasm of digestive organs: Secondary | ICD-10-CM

## 2016-07-10 DIAGNOSIS — A419 Sepsis, unspecified organism: Secondary | ICD-10-CM | POA: Diagnosis not present

## 2016-07-10 DIAGNOSIS — K219 Gastro-esophageal reflux disease without esophagitis: Secondary | ICD-10-CM | POA: Diagnosis present

## 2016-07-10 DIAGNOSIS — E785 Hyperlipidemia, unspecified: Secondary | ICD-10-CM | POA: Diagnosis present

## 2016-07-10 DIAGNOSIS — Z7951 Long term (current) use of inhaled steroids: Secondary | ICD-10-CM

## 2016-07-10 DIAGNOSIS — J9811 Atelectasis: Secondary | ICD-10-CM | POA: Diagnosis not present

## 2016-07-10 DIAGNOSIS — D509 Iron deficiency anemia, unspecified: Secondary | ICD-10-CM | POA: Diagnosis present

## 2016-07-10 DIAGNOSIS — R509 Fever, unspecified: Secondary | ICD-10-CM | POA: Diagnosis not present

## 2016-07-10 DIAGNOSIS — E440 Moderate protein-calorie malnutrition: Secondary | ICD-10-CM | POA: Insufficient documentation

## 2016-07-10 DIAGNOSIS — R7881 Bacteremia: Secondary | ICD-10-CM

## 2016-07-10 DIAGNOSIS — D696 Thrombocytopenia, unspecified: Secondary | ICD-10-CM | POA: Diagnosis not present

## 2016-07-10 DIAGNOSIS — Z8249 Family history of ischemic heart disease and other diseases of the circulatory system: Secondary | ICD-10-CM | POA: Diagnosis not present

## 2016-07-10 DIAGNOSIS — D649 Anemia, unspecified: Secondary | ICD-10-CM | POA: Diagnosis not present

## 2016-07-10 DIAGNOSIS — Z8673 Personal history of transient ischemic attack (TIA), and cerebral infarction without residual deficits: Secondary | ICD-10-CM | POA: Diagnosis not present

## 2016-07-10 DIAGNOSIS — Z9049 Acquired absence of other specified parts of digestive tract: Secondary | ICD-10-CM

## 2016-07-10 DIAGNOSIS — I714 Abdominal aortic aneurysm, without rupture: Secondary | ICD-10-CM | POA: Diagnosis not present

## 2016-07-10 DIAGNOSIS — K750 Abscess of liver: Secondary | ICD-10-CM

## 2016-07-10 DIAGNOSIS — E860 Dehydration: Secondary | ICD-10-CM | POA: Diagnosis present

## 2016-07-10 DIAGNOSIS — A409 Streptococcal sepsis, unspecified: Secondary | ICD-10-CM | POA: Diagnosis not present

## 2016-07-10 DIAGNOSIS — R1110 Vomiting, unspecified: Secondary | ICD-10-CM | POA: Diagnosis not present

## 2016-07-10 DIAGNOSIS — G92 Toxic encephalopathy: Secondary | ICD-10-CM | POA: Diagnosis not present

## 2016-07-10 DIAGNOSIS — B179 Acute viral hepatitis, unspecified: Secondary | ICD-10-CM | POA: Diagnosis not present

## 2016-07-10 DIAGNOSIS — Z808 Family history of malignant neoplasm of other organs or systems: Secondary | ICD-10-CM | POA: Diagnosis not present

## 2016-07-10 DIAGNOSIS — D6959 Other secondary thrombocytopenia: Secondary | ICD-10-CM | POA: Diagnosis not present

## 2016-07-10 DIAGNOSIS — I10 Essential (primary) hypertension: Secondary | ICD-10-CM | POA: Diagnosis not present

## 2016-07-10 DIAGNOSIS — I7143 Infrarenal abdominal aortic aneurysm, without rupture: Secondary | ICD-10-CM | POA: Diagnosis present

## 2016-07-10 DIAGNOSIS — Z87891 Personal history of nicotine dependence: Secondary | ICD-10-CM

## 2016-07-10 DIAGNOSIS — Z79899 Other long term (current) drug therapy: Secondary | ICD-10-CM | POA: Diagnosis not present

## 2016-07-10 DIAGNOSIS — R0682 Tachypnea, not elsewhere classified: Secondary | ICD-10-CM

## 2016-07-10 DIAGNOSIS — A408 Other streptococcal sepsis: Secondary | ICD-10-CM | POA: Diagnosis not present

## 2016-07-10 DIAGNOSIS — I2510 Atherosclerotic heart disease of native coronary artery without angina pectoris: Secondary | ICD-10-CM | POA: Diagnosis not present

## 2016-07-10 DIAGNOSIS — R6520 Severe sepsis without septic shock: Secondary | ICD-10-CM

## 2016-07-10 DIAGNOSIS — B955 Unspecified streptococcus as the cause of diseases classified elsewhere: Secondary | ICD-10-CM | POA: Diagnosis not present

## 2016-07-10 HISTORY — DX: Abscess of liver: K75.0

## 2016-07-10 LAB — CBC WITH DIFFERENTIAL/PLATELET
BASOS ABS: 0 10*3/uL (ref 0.0–0.1)
Basophils Relative: 0 %
Eosinophils Absolute: 0 10*3/uL (ref 0.0–0.7)
Eosinophils Relative: 0 %
HEMATOCRIT: 30.9 % — AB (ref 39.0–52.0)
HEMOGLOBIN: 10.3 g/dL — AB (ref 13.0–17.0)
LYMPHS PCT: 8 %
Lymphs Abs: 0.8 10*3/uL (ref 0.7–4.0)
MCH: 27.3 pg (ref 26.0–34.0)
MCHC: 33.3 g/dL (ref 30.0–36.0)
MCV: 82 fL (ref 78.0–100.0)
MONOS PCT: 7 %
Monocytes Absolute: 0.7 10*3/uL (ref 0.1–1.0)
NEUTROS PCT: 85 %
Neutro Abs: 8.5 10*3/uL — ABNORMAL HIGH (ref 1.7–7.7)
Platelets: 118 10*3/uL — ABNORMAL LOW (ref 150–400)
RBC: 3.77 MIL/uL — AB (ref 4.22–5.81)
RDW: 14.7 % (ref 11.5–15.5)
WBC: 10 10*3/uL (ref 4.0–10.5)

## 2016-07-10 LAB — COMPREHENSIVE METABOLIC PANEL
ALT: 56 U/L (ref 17–63)
ANION GAP: 9 (ref 5–15)
AST: 47 U/L — ABNORMAL HIGH (ref 15–41)
Albumin: 2.1 g/dL — ABNORMAL LOW (ref 3.5–5.0)
Alkaline Phosphatase: 310 U/L — ABNORMAL HIGH (ref 38–126)
BILIRUBIN TOTAL: 1.9 mg/dL — AB (ref 0.3–1.2)
BUN: 35 mg/dL — ABNORMAL HIGH (ref 6–20)
CO2: 24 mmol/L (ref 22–32)
Calcium: 7.8 mg/dL — ABNORMAL LOW (ref 8.9–10.3)
Chloride: 107 mmol/L (ref 101–111)
Creatinine, Ser: 0.95 mg/dL (ref 0.61–1.24)
GFR calc Af Amer: >60 mL/min (ref >60)
Glucose, Bld: 120 mg/dL — ABNORMAL HIGH (ref 65–99)
POTASSIUM: 3.7 mmol/L (ref 3.5–5.1)
Sodium: 140 mmol/L (ref 135–145)
TOTAL PROTEIN: 5.3 g/dL — AB (ref 6.5–8.1)

## 2016-07-10 LAB — RETICULOCYTES
RBC.: 3.82 MIL/uL — ABNORMAL LOW (ref 4.22–5.81)
Retic Count, Absolute: 19.1 10*3/uL (ref 19.0–186.0)
Retic Ct Pct: 0.5 % (ref 0.4–3.1)

## 2016-07-10 LAB — ROCKY MTN SPOTTED FVR ABS PNL(IGG+IGM)
RMSF IGG: NOT DETECTED
RMSF IGM: NOT DETECTED

## 2016-07-10 LAB — URINALYSIS, ROUTINE W REFLEX MICROSCOPIC
Bilirubin Urine: NEGATIVE
Glucose, UA: NEGATIVE mg/dL
Hgb urine dipstick: NEGATIVE
KETONES UR: NEGATIVE mg/dL
Leukocytes, UA: NEGATIVE
NITRITE: NEGATIVE
PH: 6 (ref 5.0–8.0)
Protein, ur: NEGATIVE mg/dL
SPECIFIC GRAVITY, URINE: 1.038 — AB (ref 1.005–1.030)

## 2016-07-10 LAB — ACUTE HEP PANEL AND HEP B SURFACE AB
HCV Ab: NEGATIVE
HEP A IGM: NONREACTIVE
HEP B C IGM: NONREACTIVE
HEP B S AB: NEGATIVE
Hepatitis B Surface Ag: NEGATIVE

## 2016-07-10 LAB — LYME AB/WESTERN BLOT REFLEX: B burgdorferi Ab IgG+IgM: <0.9 Index (ref <0.90)

## 2016-07-10 LAB — I-STAT CG4 LACTIC ACID, ED: LACTIC ACID, VENOUS: 1.31 mmol/L (ref 0.5–1.9)

## 2016-07-10 LAB — ANA: Anti Nuclear Antibody(ANA): NEGATIVE

## 2016-07-10 MED ORDER — HYDROMORPHONE HCL 1 MG/ML IJ SOLN
0.5000 mg | INTRAMUSCULAR | Status: DC | PRN
  Administered 2016-07-10 – 2016-07-11 (×2): 1 mg via INTRAVENOUS
  Administered 2016-07-11: 0.5 mg via INTRAVENOUS
  Administered 2016-07-11 – 2016-07-12 (×5): 1 mg via INTRAVENOUS
  Administered 2016-07-13: 0.5 mg via INTRAVENOUS
  Administered 2016-07-14 – 2016-07-16 (×8): 1 mg via INTRAVENOUS
  Filled 2016-07-10: qty 1
  Filled 2016-07-10: qty 0.5
  Filled 2016-07-10 (×9): qty 1
  Filled 2016-07-10: qty 0.5
  Filled 2016-07-10 (×5): qty 1

## 2016-07-10 MED ORDER — AMLODIPINE BESYLATE 5 MG PO TABS
5.0000 mg | ORAL_TABLET | Freq: Every day | ORAL | Status: DC
  Administered 2016-07-12 – 2016-07-16 (×5): 5 mg via ORAL
  Filled 2016-07-10 (×5): qty 1

## 2016-07-10 MED ORDER — SODIUM CHLORIDE 0.9 % IV SOLN
1000.0000 mL | INTRAVENOUS | Status: DC
  Administered 2016-07-10 – 2016-07-13 (×4): 1000 mL via INTRAVENOUS

## 2016-07-10 MED ORDER — IOPAMIDOL (ISOVUE-300) INJECTION 61%
100.0000 mL | Freq: Once | INTRAVENOUS | Status: AC | PRN
  Administered 2016-07-10: 100 mL via INTRAVENOUS

## 2016-07-10 MED ORDER — VITAMIN B-12 1000 MCG PO TABS
1000.0000 ug | ORAL_TABLET | Freq: Every day | ORAL | Status: DC
  Administered 2016-07-12 – 2016-07-16 (×5): 1000 ug via ORAL
  Filled 2016-07-10 (×5): qty 1

## 2016-07-10 MED ORDER — SODIUM CHLORIDE 0.9% FLUSH
3.0000 mL | Freq: Two times a day (BID) | INTRAVENOUS | Status: DC
  Administered 2016-07-11 – 2016-07-14 (×9): 3 mL via INTRAVENOUS

## 2016-07-10 MED ORDER — ACETAMINOPHEN 325 MG PO TABS
650.0000 mg | ORAL_TABLET | Freq: Four times a day (QID) | ORAL | Status: DC | PRN
  Administered 2016-07-10 – 2016-07-13 (×4): 650 mg via ORAL
  Filled 2016-07-10 (×5): qty 2

## 2016-07-10 MED ORDER — SODIUM CHLORIDE 0.9 % IV SOLN
1000.0000 mL | INTRAVENOUS | Status: DC
  Administered 2016-07-10 (×2): 1000 mL via INTRAVENOUS

## 2016-07-10 MED ORDER — DEXTROSE 5 % IV SOLN
2.0000 g | INTRAVENOUS | Status: DC
  Administered 2016-07-10: 2 g via INTRAVENOUS
  Filled 2016-07-10 (×3): qty 2

## 2016-07-10 MED ORDER — METOPROLOL TARTRATE 50 MG PO TABS
50.0000 mg | ORAL_TABLET | Freq: Two times a day (BID) | ORAL | Status: DC
  Administered 2016-07-11 – 2016-07-16 (×11): 50 mg via ORAL
  Filled 2016-07-10: qty 2
  Filled 2016-07-10 (×3): qty 1
  Filled 2016-07-10: qty 2
  Filled 2016-07-10 (×3): qty 1
  Filled 2016-07-10 (×3): qty 2

## 2016-07-10 MED ORDER — ENOXAPARIN SODIUM 40 MG/0.4ML ~~LOC~~ SOLN
40.0000 mg | Freq: Every day | SUBCUTANEOUS | Status: DC
  Filled 2016-07-10: qty 0.4

## 2016-07-10 MED ORDER — PANTOPRAZOLE SODIUM 40 MG PO TBEC
40.0000 mg | DELAYED_RELEASE_TABLET | Freq: Every day | ORAL | Status: DC
  Administered 2016-07-12 – 2016-07-16 (×5): 40 mg via ORAL
  Filled 2016-07-10 (×6): qty 1

## 2016-07-10 MED ORDER — BISACODYL 5 MG PO TBEC: 5.0000 mg | DELAYED_RELEASE_TABLET | Freq: Every day | ORAL | Status: DC | PRN

## 2016-07-10 MED ORDER — HYDROCODONE-ACETAMINOPHEN 5-325 MG PO TABS
0.5000 | ORAL_TABLET | Freq: Four times a day (QID) | ORAL | Status: DC | PRN
Start: 2016-07-10 — End: 2016-07-16
  Administered 2016-07-11 – 2016-07-16 (×15): 1 via ORAL
  Filled 2016-07-10 (×15): qty 1

## 2016-07-10 MED ORDER — DIPHENHYDRAMINE HCL 50 MG PO CAPS
50.0000 mg | ORAL_CAPSULE | Freq: Four times a day (QID) | ORAL | Status: DC | PRN
  Administered 2016-07-10 – 2016-07-15 (×8): 50 mg via ORAL
  Filled 2016-07-10 (×8): qty 1

## 2016-07-10 MED ORDER — ONDANSETRON HCL 4 MG/2ML IJ SOLN
4.0000 mg | Freq: Once | INTRAMUSCULAR | Status: AC
  Administered 2016-07-10: 4 mg via INTRAVENOUS
  Filled 2016-07-10: qty 2

## 2016-07-10 MED ORDER — HYDROMORPHONE HCL 1 MG/ML IJ SOLN
1.0000 mg | Freq: Once | INTRAMUSCULAR | Status: AC
  Administered 2016-07-10: 1 mg via INTRAVENOUS
  Filled 2016-07-10: qty 1

## 2016-07-10 MED ORDER — ISOSORBIDE MONONITRATE ER 30 MG PO TB24
30.0000 mg | ORAL_TABLET | Freq: Every day | ORAL | Status: DC
  Administered 2016-07-11 – 2016-07-15 (×5): 30 mg via ORAL
  Filled 2016-07-10 (×5): qty 1

## 2016-07-10 MED ORDER — POLYETHYLENE GLYCOL 3350 17 G PO PACK: 17.0000 g | PACK | Freq: Every day | ORAL | Status: DC | PRN

## 2016-07-10 MED ORDER — VITAMIN D3 25 MCG (1000 UNIT) PO TABS
1000.0000 [IU] | ORAL_TABLET | Freq: Every day | ORAL | Status: DC
  Administered 2016-07-12 – 2016-07-16 (×5): 1000 [IU] via ORAL
  Filled 2016-07-10 (×5): qty 1

## 2016-07-10 MED ORDER — ACETAMINOPHEN 650 MG RE SUPP
650.0000 mg | Freq: Four times a day (QID) | RECTAL | Status: DC | PRN
  Administered 2016-07-11: 650 mg via RECTAL
  Filled 2016-07-10: qty 1

## 2016-07-10 MED ORDER — METRONIDAZOLE IN NACL 5-0.79 MG/ML-% IV SOLN
500.0000 mg | Freq: Three times a day (TID) | INTRAVENOUS | Status: DC
  Administered 2016-07-10 – 2016-07-11 (×3): 500 mg via INTRAVENOUS
  Filled 2016-07-10 (×5): qty 100

## 2016-07-10 MED ORDER — SODIUM CHLORIDE 0.9 % IV SOLN: 1000.0000 mL | INTRAVENOUS | Status: DC

## 2016-07-10 NOTE — ED Notes (Signed)
Per MD order U/A. Pt advised specimen needed; pt provided urinal to collect specimen at bedside. Pt acknowledged/confirmed understanding. [employer]

## 2016-07-10 NOTE — ED Notes (Signed)
Ultrasound at bedside

## 2016-07-10 NOTE — Progress Notes (Signed)
Called pt and lmovm to return call.

## 2016-07-10 NOTE — ED Triage Notes (Signed)
Per EMS: Pt coming from home. Pt has been having dizziness, fever with chills. N/V, decreased appetite. Pt has been seen several times by his drs. He has been tested for [employer]. Spotted fever and lyme, both negative. WBC elevated and liver enzymes are elevated per DR. Pt was started on antibiotic in may but then it was DC'd.   Pt was febrile at 102 temporal Pt has had 1000 of tylenol with EMS Last temporal was 97.1  120/78 HR 116 RR 24 92 on RA, Pt placed on 2 liters CBG 150  18 LA  Has had 900 of NS with EMS

## 2016-07-10 NOTE — ED Notes (Signed)
Pt is aware that a urine sample is needed but is unable to provide one at this time 

## 2016-07-10 NOTE — H&P (Signed)
History and Physical    Brandon [name] BMW:[id number] DOB: [birth year] DOA: 07/10/2016  PCP: Brunetta Jeans, PA-C   Patient coming from: Home  Chief Complaint: Subjective fevers/chills, malaise, anorexia, wt loss  HPI: Brandon [name] is a 76 y.o. male with medical history significant for coronary artery disease, GERD, osteoarthritis, and hypertension, now presenting to the emergency department for evaluation of subjective fevers, chills, loss of appetite with 15 pound weight loss, and nonspecific malaise. Patient reports that he had been in his usual state of health until one month ago when he developed a nonspecific malaise with subjective fevers and chills. He also began to develop a loss of appetite around that time. Since then, he has been evaluated by his PCP and in the emergency department, was treated with a course of antibiotics for possible tickborne illness though titers were negative, and has not experienced any improvement in his symptoms. He reports a 15 pound weight loss over this interval. He notes a long-standing history of occasional loose stools, but denies any significant diarrhea or constipation. He denies any significant abdominal pain, reports mild nausea, but denies vomiting. There has not been any significant cough or dyspnea and no chest pain or palpitations. He denies any recent long distance travel, GI tract instrumentation, or sick contacts. He denies melena or hematochezia. He had a colonoscopy in February with polyps with pathology report indicating no high-grade dysplasia or malignancy.  ED Course: Upon arrival to the ED, patient is found to be afebrile, saturating adequately on 2 L/m her supplemental oxygen, mildly tachycardic, and with stable blood pressure. EKG features a sinus rhythm with inferior Q-wave. Chemistry panel is notable for elevated BUN to creatinine ratio, elevated alkaline phosphatase to 310, AST 47 and slightly improved from the day before, and a total  bilirubin of 1.9, increased from 1.3 the day prior. CBC is notable for a worsening normocytic anemia with hemoglobin of 10.3, and a stable thrombocytopenia with platelets 118,000. CT of the abdomen and pelvis demonstrates a 7.4 cm heterogenous cystic mass in the right hepatic lobe suspicious for a liver abscess. Blood cultures were obtained in the ED and the patient was provided symptomatic treatment with Dilaudid and Zofran. He remained mildly tachycardic, but with stable blood pressure and no respiratory distress in the ED and will be admitted to the telemetry unit for ongoing evaluation and management of fevers at home with malaise, anorexia, and weight loss, likely secondary to liver abscess versus neoplasm.  Review of Systems:  All other systems reviewed and apart from HPI, are negative.  Past Medical History:  Diagnosis Date  . Aortic stenosis    mild echocardiogram 8/09 EF 60%, showed no regional wall motion abnml, mild LVH, mod focal basal septal hypertrophy and mild dyastolic dysfunction. 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 mean gradient across aortic valve was 8 mmHG. also mild L atrial enlargement and normal RV size and function. minimal AS on LHC in 7/10.   . Arthritis   . BPH (benign prostatic hypertrophy)   . CAD (coronary artery disease)    LCH (7/10) totally occluded proximal RCA with very robuse L to R collaterals, 50% proximal LAD stenosis, EF 65%, medical management.    . Chicken pox   . Diverticulosis   . Esophageal stricture   . GERD (gastroesophageal reflux disease)   . Hiatal hernia   . HLD (hyperlipidemia)   . HTN (hypertension)   . Low back pain  s/p surgical fusion  . Osteoarthritis   . Smoker   . TIA (transient ischemic attack)    hx  . Tubular adenoma of colon 2009    Past Surgical History:  Procedure Laterality Date  . cataract surgery  01/2012/02/2012   both eyes/lens implanted  . CHOLECYSTECTOMY  1994  .  L4-5 discectomy  4/98   Dr. Sherwood Gambler   . lumbar decompression and Ray cage  8/98   L4-5 Dr. Rita Ohara  . LUMBAR LAMINECTOMY  9/04   and microdiscectomy. L3-4. Dr. Sherwood Gambler  . MOUTH SURGERY     FULL DENTURES     reports that he quit smoking about 13 months ago. His smoking use included Cigarettes. He has a 75.00 pack-year smoking history. He has quit using smokeless tobacco. He reports that he does not drink alcohol or use drugs.  Allergies  Allergen Reactions  . Celecoxib     REACTION: hives and itching    Family History  Problem Relation Age of Onset  . Heart disease Father 25       Living  . Coronary artery disease Father        CABG  . Alzheimer's disease Mother 75       Deceased  . Arthritis/Rheumatoid Mother   . Stomach cancer Maternal Uncle   . Brain cancer Maternal Aunt        x2  . Arthritis Brother        DJD  . Obesity Daughter        Had Bypass Sx     Prior to Admission medications   Medication Sig Start Date End Date Taking? Authorizing Provider  acetaminophen (TYLENOL) 325 MG tablet Take 325 mg by mouth every 6 (six) hours as needed for fever.   Yes [provider]  amLODipine (NORVASC) 5 MG tablet TAKE 1 TABLET(5 MG) BY MOUTH DAILY 06/25/16  Yes Larey Dresser, MD  aspirin 81 MG EC tablet Take 81 mg by mouth daily.     Yes [provider]  atorvastatin (LIPITOR) 40 MG tablet Take 40 mg by mouth daily.   Yes [provider]  benazepril (LOTENSIN) 20 MG tablet TAKE 1 TABLET BY MOUTH DAILY 05/14/16  Yes Brunetta Jeans, PA-C  cholecalciferol (VITAMIN D) 1000 UNITS tablet Take 1,000 Units by mouth daily.    Yes [provider]  diphenhydrAMINE (BENADRYL) 25 MG tablet Take 50 mg by mouth every 6 (six) hours as needed.   Yes [provider]  fluticasone (FLONASE) 50 MCG/ACT nasal spray Place 2 sprays into both nostrils daily as needed. Patient taking differently: Place 2 sprays into both nostrils daily as needed for  allergies.  12/07/14  Yes Brunetta Jeans, PA-C  HYDROcodone-acetaminophen (NORCO/VICODIN) 5-325 MG tablet Take 0.5-1 tablets by mouth every 6 (six) hours as needed for moderate pain. 05/22/16  Yes Brunetta Jeans, PA-C  isosorbide mononitrate (IMDUR) 30 MG 24 hr tablet TAKE 1 TABLET BY MOUTH AT BEDTIME 04/12/16  Yes Brunetta Jeans, PA-C  metoprolol (LOPRESSOR) 50 MG tablet TAKE 1 TABLET BY MOUTH TWICE DAILY 04/13/16  Yes Brunetta Jeans, PA-C  Omega-3 Fatty Acids (FISH OIL) 1000 MG CAPS Take 2 capsules by mouth 2 (two) times daily.     Yes [provider]  omeprazole (PRILOSEC) 20 MG capsule TAKE 1 CAPSULE(20 MG) BY MOUTH DAILY 07/03/16  Yes Ladene Artist, MD  vitamin B-12 (CYANOCOBALAMIN) 1000 MCG tablet Take 1,000 mcg by mouth daily.  Yes [provider]    Physical Exam: Vitals:   07/10/16 2004 07/10/16 2006 07/10/16 2100 07/10/16 2151  BP:  126/76 123/63 123/68  Pulse: 100 (!) 106 (!) 112 (!) 113  Resp:  19 (!) 28 (!) 29  Temp: 98.1 F (36.7 C) 98.1 F (36.7 C)    TempSrc: Oral Oral  Oral  SpO2:  92% 91% 91%  Weight:      Height:          Constitutional: NAD, calm, appears uncomfortable, listless Eyes: PERTLA, lids and conjunctivae normal ENMT: Mucous membranes are dry and tacky. Posterior pharynx clear of any exudate or lesions.   Neck: normal, supple, no masses, no thyromegaly Respiratory: clear to auscultation bilaterally, no wheezing, no crackles. Normal respiratory effort.   Cardiovascular: Rate ~110 and regular. No extremity edema. No significant JVD. Abdomen: No distension, soft, only mild tenderness in RUQ, no rebound pain or guarding. Bowel sounds active.  Musculoskeletal: no clubbing / cyanosis. No joint deformity upper and lower extremities.    Skin: no significant rashes, lesions, ulcers. Warm, dry, well-perfused. Poor turgor.  Neurologic: CN 2-12 grossly intact. Sensation intact, DTR normal. Strength 5/5 in all 4 limbs.  Psychiatric:  Alert and oriented x 3. Calm and cooperative.     Labs on Admission: I have personally reviewed following labs and imaging studies  CBC:  Recent Labs Lab 07/04/16 1052 07/06/16 2122 07/09/16 1520 07/10/16 1735  WBC 8.5 13.0* 13.4* 10.0  NEUTROABS 6.8 11.0* 11.5* 8.5*  HGB 13.0 12.3* 11.7* 10.3*  HCT 39.1 37.5* 35.7* 30.9*  MCV 84.0 84.1 83.0 82.0  PLT 216.0 165 115.0* 678*   Basic Metabolic Panel:  Recent Labs Lab 07/04/16 1052 07/06/16 2122 07/10/16 1735  NA 138 137 140  K 4.0 3.3* 3.7  CL 102 102 107  CO2 30 25 24   GLUCOSE 122* 116* 120*  BUN 8 12 35*  CREATININE 0.76 0.89 0.95  CALCIUM 9.0 8.5* 7.8*   GFR: Estimated Creatinine Clearance: 71.5 mL/min (by C-G formula based on SCr of 0.95 mg/dL). Liver Function Tests:  Recent Labs Lab 07/04/16 1052 07/06/16 2122 07/09/16 1520 07/10/16 1735  AST 17 28 77* 47*  ALT 18 28 86* 56  ALKPHOS 173* 196* 247* 310*  BILITOT 0.7 1.2 1.3* 1.9*  PROT 6.4 7.1 5.5* 5.3*  ALBUMIN 3.6 3.1* 2.9* 2.1*   No results for input(s): LIPASE, AMYLASE in the last 168 hours. No results for input(s): AMMONIA in the last 168 hours. Coagulation Profile:  Recent Labs Lab 07/06/16 2122  INR 1.25   Cardiac Enzymes: No results for input(s): CKTOTAL, CKMB, CKMBINDEX, TROPONINI in the last 168 hours. BNP (last 3 results) No results for input(s): PROBNP in the last 8760 hours. HbA1C: No results for input(s): HGBA1C in the last 72 hours. CBG: No results for input(s): GLUCAP in the last 168 hours. Lipid Profile: No results for input(s): CHOL, HDL, LDLCALC, TRIG, CHOLHDL, LDLDIRECT in the last 72 hours. Thyroid Function Tests: No results for input(s): TSH, T4TOTAL, FREET4, T3FREE, THYROIDAB in the last 72 hours. Anemia Panel: No results for input(s): VITAMINB12, FOLATE, FERRITIN, TIBC, IRON, RETICCTPCT in the last 72 hours. Urine analysis:    Component Value Date/Time   COLORURINE YELLOW 07/10/2016 1735   APPEARANCEUR CLEAR  07/10/2016 1735   LABSPEC 1.038 (H) 07/10/2016 1735   PHURINE 6.0 07/10/2016 1735   GLUCOSEU NEGATIVE 07/10/2016 1735   GLUCOSEU NEGATIVE 02/15/2016 1135   HGBUR NEGATIVE 07/10/2016 1735   [place] 07/10/2016  [place] negative 07/04/2016 [place] 07/10/2016 1735   PROTEINUR NEGATIVE 07/10/2016 1735   UROBILINOGEN 1.0 07/04/2016 1053   UROBILINOGEN 0.2 02/15/2016 1135   NITRITE NEGATIVE 07/10/2016 1735   LEUKOCYTESUR NEGATIVE 07/10/2016 1735   Sepsis Labs: @LABRCNTIP (procalcitonin:4,lacticidven:4) ) Recent Results (from the past 240 hour(s))  Culture, blood (Routine x 2)     Status: None (Preliminary result)   Collection Time: 07/06/16  9:22 PM  Result Value Ref Range Status   Specimen Description BLOOD LEFT FOREARM  Final   Special Requests BOTTLES DRAWN AEROBIC AND ANAEROBIC BCAV  Final   Culture   Final    NO GROWTH 3 DAYS Performed at [facility], [place] [address] [place], [place] [address]    Report Status PENDING  Incomplete  Culture, blood (Routine x 2)     Status: None (Preliminary result)   Collection Time: 07/06/16  9:27 PM  Result Value Ref Range Status   Specimen Description BLOOD RAC  Final   Special Requests BOTTLES DRAWN AEROBIC AND ANAEROBIC BCAV  Final   Culture   Final    NO GROWTH 3 DAYS Performed at [facility], [place] [address] [place], [place] [address]    Report Status PENDING  Incomplete     Radiological Exams on Admission: Ct Abdomen Pelvis W Contrast  Result Date: 07/10/2016 CLINICAL DATA:  Nausea vomiting and decreased appetite EXAM: CT ABDOMEN AND PELVIS WITH CONTRAST TECHNIQUE: Multidetector CT imaging of the abdomen and pelvis was performed using the standard protocol following bolus administration of intravenous contrast. CONTRAST:  175mL ISOVUE-300 IOPAMIDOL (ISOVUE-300) INJECTION 61% COMPARISON:  None. FINDINGS: Lower chest: Lung bases demonstrate no acute consolidation or pleural effusion.  Vascular calcification. No pericardial effusion. Hepatobiliary: Abnormal low-density at the porta hepatis which becomes less apparent on delayed views. Heterogenous low-density in the right hepatic lobe, measuring approximately 7.4 by 6.1 cm. No biliary dilatation. Surgical clips in the gallbladder fossa. Pancreas: Unremarkable. No pancreatic ductal dilatation or surrounding inflammatory changes. Spleen: Normal in size without focal abnormality. Adrenals/Urinary Tract: 1 cm low-density left adrenal gland nodule. Kidneys demonstrate no hydronephrosis. Punctate stone mid pole left kidney. Probable cortical scarring in the lower pole of the left kidney. The bladder is unremarkable. Stomach/Bowel: Mild edema and soft tissue thickening at the gastroduodenal junction. No dilated small bowel. Colon diverticular disease. No acute wall thickening. Normal appendix Vascular/Lymphatic: Extensive atherosclerotic vascular calcification. Portal vein at the hilus and the splenic vein appear patent, however low density noted at the porta hepatis in the region of right and left portal vessels. Periportal edema is present. Infrarenal abdominal aortic aneurysm measuring 3.9 cm in maximum diameter. Reproductive: Enlarged prostate gland Other: Small free fluid in the pelvis.  No free air Musculoskeletal: Postsurgical changes at L4-L5. There are degenerative changes. No acute or suspicious bone lesion. IMPRESSION: 1. 7.4 cm heterogenous cystic mass in the right hepatic lobe, this is suspicious for liver abscess in the appropriate clinical setting. Cystic neoplasm also a consideration. 2. Low-density in the porta hepatis could represent altered perfusion ; unable to document patency of the right and left portal veins, consider correlation with Doppler to evaluate for patency of the intrahepatic portal vessels. 3. Small free fluid in the pelvis. Sigmoid colon diverticular disease without acute inflammation 4. Mild soft tissue stranding at  the gastroduodenal junction, could relate to mild gastro duodenitis. 5. 3.9 cm infrarenal abdominal aortic aneurysm. Recommend followup by ultrasound in 2 years. This recommendation  follows ACR consensus guidelines: White Paper of the ACR Incidental Findings Committee II on Vascular Findings. J Am Coll Radiol 2013; 10:789-794. Electronically Signed   By: Donavan Foil M.D.   On: 07/10/2016 19:38   US Abdomen Limited Ruq  Result Date: 07/10/2016 CLINICAL DATA:  Abnormal CT.  Portal vein obstruction. EXAM: ULTRASOUND ABDOMEN LIMITED RIGHT UPPER QUADRANT COMPARISON:  Abdominal CT earlier this day. FINDINGS: Gallbladder: Surgically absent. Common bile duct: Diameter: 5.7 mm, normal. Liver: Heterogeneous 7.0 x 7.5 x 6.4 cm lesion in the right hepatic lobe corresponding to abnormality on CT. There is an internal cystic components. Hepatic echotexture is heterogeneous, particularly centrally at the porta hepatis. There is normal directional flow of the portal vein at the porta hepatis, however possible cut off with questionable filling defect and surrounding collaterals at the porta hepatis. The central and left hepatic veins are visualized and are patent. IMPRESSION: 1. Questionable filling defect in the portal vein at the porta hepatis with surrounding collaterals, extrahepatic portal vein demonstrates normal directional flow. 2. Heterogeneous 7 x 7.5 x 6.4 cm lesion in the right hepatic lobe with some internal cystic components. Recommend MRI characterization with contrast. Electronically Signed   By: Jeb Levering M.D.   On: 07/10/2016 22:14    EKG: Independently reviewed. Sinus rhythm, inferior Q-wave  Assessment/Plan  1. Liver mass  - Pt presents with subjective fevers/chills, anorexia, and malaise for 1 month - He is noted to have 15-lb wt-loss and developed a new anemia over this interval  - CT abdomen/pelvis reveals a mass in the right hepatic lobe suggestive of abscess, but with cystic neoplasm a  consideration - Blood cultures from 07/06/16 have NGTD; blood cultures were collected again in ED - Plan to start empiric Rocephin and Flagyl, request CT-guided aspiration, continue supportive care with IVF, analgesia, antiemetics   2. Anemia, thrombocytopenia  - Hgb is 10.3 on admission, progressively worsening over past month, was wnl in early May '18  - Platelets 118k on admission - No bleeding is evident, no pallor; pt denies melena or hematochezia  - Colonoscopy in Feb '18 with 2 polyps, path report no high-grade dysplasia or malignancy  - Check anemia panel, trend CBC   3. CAD - No anginal complaints  - Continue Lopressor and Imdur  - Hold benazepril given soft BP and dehydration with elevated BUN  - Hold Lipitor in light of elevated LFT's  4. Hypertension  - BP soft but stable in ED  - Continue Norvasc as tolerated; hold benazepril in setting of dehydration   5. GERD - EGD in Feb '18 with stricture at GE junction and hiatal hernia  - He is managed at home with daily PPI, will continue    6. AAA  - 3.9 cm infrarenal AAA noted incidentally on CT abd/pelvis  - Follow-up in 2 years with abd [place] is advised     DVT prophylaxis: sq Lovenox Code Status: Full  Family Communication: Wife updated at bedside Disposition Plan: Admit to telemetry Consults called: None Admission status: Inpatient    Vianne Bulls, MD [facility] Pager [phone]  If 7PM-7AM, please contact night-coverage [email or web address] Password [email or web address]  07/10/2016, 10:50 PM

## 2016-07-10 NOTE — ED Provider Notes (Signed)
[facility] DEPT Provider Note   CSN: [id number] Arrival date & time: 07/10/16  1604     History   Chief Complaint Chief Complaint  Patient presents with  . Weakness  . Fever    HPI Brandon [name] is a 76 y.o. male.  Patient with history of fevers since early May. Originally treated as if it could be [facility] spotted fever but titers were negative for that. Patient seen June 1 here in the emergency department with recommendation for admission the patient refused. Patient seen by primary care doctor since then. And was arranged to have follow-up for echocardiogram and the evaluation by infectious disease. Patient had a remote history of hepatitis. Liver function test have been consistently abnormal. Patient has had fevers chills diaphoresis decreased appetite and losing weight. Patient just not feeling well at all. No cystic pain anywhere other than back pain which is kind of baseline for him.      Past Medical History:  Diagnosis Date  . Aortic stenosis    mild echocardiogram 8/09 EF 60%, showed no regional wall motion abnml, mild LVH, mod focal basal septal hypertrophy and mild dyastolic dysfunction. 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 mean gradient across aortic valve was 8 mmHG. also mild L atrial enlargement and normal RV size and function. minimal AS on LHC in 7/10.   . Arthritis   . BPH (benign prostatic hypertrophy)   . CAD (coronary artery disease)    LCH (7/10) totally occluded proximal RCA with very robuse L to R collaterals, 50% proximal LAD stenosis, EF 65%, medical management.    . Chicken pox   . Diverticulosis   . Esophageal stricture   . GERD (gastroesophageal reflux disease)   . Hiatal hernia   . HLD (hyperlipidemia)   . HTN (hypertension)   . Low back pain    s/p surgical fusion  . Osteoarthritis   . Smoker   . TIA (transient ischemic attack)    hx  . Tubular adenoma of colon 2009    Patient Active  Problem List   Diagnosis Date Noted  . 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 Aneurysm of infrarenal abdominal aorta (HCC) 07/10/2016  . Normocytic anemia 07/10/2016  . Thrombocytopenia ([place]) 07/10/2016  . Encounter for annual physical exam 02/23/2016  . Tobacco abuse disorder 01/10/2014  . Seasonal allergies 11/23/2013  . Hypertension 05/19/2013  . Osteoarthritis 05/29/2009  . 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 CAD, NATIVE VESSEL 09/15/2008  . Aortic valve disorder 01/28/2008  . BRAIN STEM STROKE 08/27/2007  . 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 BENIGN PROSTATIC HYPERTROPHY, HX OF 08/27/2007  . Hyperlipidemia 03/19/2007  . ANXIETY 03/19/2007  . GERD 03/19/2007    Past Surgical History:  Procedure Laterality Date  . cataract surgery  01/2012/02/2012   both eyes/lens implanted  . CHOLECYSTECTOMY  1994  . L4-5 discectomy  4/98   Dr. Sherwood Gambler   . lumbar decompression and Ray cage  8/98   L4-5 Dr. Rita Ohara  . LUMBAR LAMINECTOMY  9/04   and microdiscectomy. L3-4. Dr. Sherwood Gambler  . MOUTH SURGERY     FULL DENTURES       Home Medications    Prior to Admission medications   Medication Sig Start Date End Date Taking? Authorizing Provider  acetaminophen (TYLENOL) 325 MG tablet Take 325 mg by mouth every 6 (six) hours as needed for fever.   Yes [provider]  amLODipine (NORVASC) 5 MG tablet TAKE 1 TABLET(5  MG) BY MOUTH DAILY 06/25/16  Yes Larey Dresser, MD  aspirin 81 MG EC tablet Take 81 mg by mouth daily.     Yes [provider]  atorvastatin (LIPITOR) 40 MG tablet Take 40 mg by mouth daily.   Yes [provider]  benazepril (LOTENSIN) 20 MG tablet TAKE 1 TABLET BY MOUTH DAILY 05/14/16  Yes Brunetta Jeans, PA-C  cholecalciferol (VITAMIN D) 1000 UNITS tablet Take 1,000 Units by mouth daily.    Yes [provider]  diphenhydrAMINE (BENADRYL) 25 MG tablet Take 50 mg by mouth every 6 (six) hours as needed.   Yes [provider]  fluticasone (FLONASE)  50 MCG/ACT nasal spray Place 2 sprays into both nostrils daily as needed. Patient taking differently: Place 2 sprays into both nostrils daily as needed for allergies.  12/07/14  Yes Brunetta Jeans, PA-C  HYDROcodone-acetaminophen (NORCO/VICODIN) 5-325 MG tablet Take 0.5-1 tablets by mouth every 6 (six) hours as needed for moderate pain. 05/22/16  Yes Brunetta Jeans, PA-C  isosorbide mononitrate (IMDUR) 30 MG 24 hr tablet TAKE 1 TABLET BY MOUTH AT BEDTIME 04/12/16  Yes Brunetta Jeans, PA-C  metoprolol (LOPRESSOR) 50 MG tablet TAKE 1 TABLET BY MOUTH TWICE DAILY 04/13/16  Yes Brunetta Jeans, PA-C  Omega-3 Fatty Acids (FISH OIL) 1000 MG CAPS Take 2 capsules by mouth 2 (two) times daily.     Yes [provider]  omeprazole (PRILOSEC) 20 MG capsule TAKE 1 CAPSULE(20 MG) BY MOUTH DAILY 07/03/16  Yes Ladene Artist, MD  vitamin B-12 (CYANOCOBALAMIN) 1000 MCG tablet Take 1,000 mcg by mouth daily.     Yes [provider]    Family History Family History  Problem Relation Age of Onset  . Heart disease Father 21       Living  . Coronary artery disease Father        CABG  . Alzheimer's disease Mother 56       Deceased  . Arthritis/Rheumatoid Mother   . Stomach cancer Maternal Uncle   . Brain cancer Maternal Aunt        x2  . Arthritis Brother        DJD  . Obesity Daughter        Had Bypass Sx    Social History Social History  Substance Use Topics  . Smoking status: Former Smoker    Packs/day: 1.50    Years: 50.00    Types: Cigarettes    Quit date: 06/2015  . Smokeless tobacco: Former Systems developer     Comment: smoked less than 1 ppd for 40+ years; Using Administrator, sports  . Alcohol use No     Comment: social      Allergies   Celecoxib   Review of Systems Review of Systems  Constitutional: Positive for activity change, appetite change, chills, diaphoresis and fever.  HENT: Negative for congestion.   Eyes: Negative for visual disturbance.  Respiratory: Negative for  shortness of breath.   Cardiovascular: Negative for chest pain.  Gastrointestinal: Negative for abdominal pain.  Genitourinary: Negative for dysuria.  Musculoskeletal: Positive for back pain.  Skin: Negative for rash.  Neurological: Negative for headaches.  Hematological: Does not bruise/bleed easily.  Psychiatric/Behavioral: Negative for confusion.     Physical Exam Updated Vital Signs BP 123/63   Pulse (!) 112   Temp 98.1 F (36.7 C) (Oral)   Resp (!) 28   Ht 1.778 m (5\' 10" )   Wt 76.2 kg (168 lb)  SpO2 91%   BMI 24.11 kg/m   Physical Exam  Constitutional: He is oriented to person, place, and time. He appears well-developed and well-nourished. No distress.  HENT:  Head: Normocephalic and atraumatic.  Mucous membranes dry.  Eyes: EOM are normal. Pupils are equal, round, and reactive to light.  Neck: Normal range of motion. Neck supple.  Cardiovascular: Normal rate, regular rhythm and normal heart sounds.   Pulmonary/Chest: Effort normal and breath sounds normal. No respiratory distress.  Abdominal: Soft. Bowel sounds are normal. There is no tenderness.  Musculoskeletal: Normal range of motion.  Neurological: He is alert and oriented to person, place, and time. No cranial nerve deficit or sensory deficit. He exhibits normal muscle tone. Coordination normal.  Skin: Skin is warm.  Nursing note and vitals reviewed.    ED Treatments / Results  Labs (all labs ordered are listed, but only abnormal results are displayed) Labs Reviewed  COMPREHENSIVE METABOLIC PANEL - Abnormal; Notable for the following:       Result Value   Glucose, Bld 120 (*)    BUN 35 (*)    Calcium 7.8 (*)    Total Protein 5.3 (*)    Albumin 2.1 (*)    AST 47 (*)    Alkaline Phosphatase 310 (*)    Total Bilirubin 1.9 (*)    All other components within normal limits  CBC WITH DIFFERENTIAL/PLATELET - Abnormal; Notable for the following:    RBC 3.77 (*)    Hemoglobin 10.3 (*)    HCT 30.9 (*)     Platelets 118 (*)    Neutro Abs 8.5 (*)    All other components within normal limits  URINALYSIS, ROUTINE W REFLEX MICROSCOPIC - Abnormal; Notable for the following:    Specific Gravity, Urine 1.038 (*)    All other components within normal limits  CULTURE, BLOOD (ROUTINE X 2)  CULTURE, BLOOD (ROUTINE X 2)  CULTURE, BLOOD (ROUTINE X 2)  CULTURE, BLOOD (ROUTINE X 2)  VITAMIN B12  FOLATE  IRON AND TIBC  FERRITIN  RETICULOCYTES  STREP PNEUMONIAE URINARY ANTIGEN  E. HISTOLYTICA ANTIBODY (AMOEBA AB)  I-STAT CG4 LACTIC ACID, ED  I-STAT CG4 LACTIC ACID, ED    EKG  EKG Interpretation  Date/Time:  Tuesday July 10 2016 17:40:51 EDT Ventricular Rate:  94 PR Interval:    QRS Duration: 96 QT Interval:  361 QTC Calculation: 452 R Axis:   54 Text Interpretation:  Sinus rhythm Probable inferior infarct, age indeterminate Baseline wander in lead(s) II No significant change since last tracing Confirmed by Fredia Sorrow [phone]) on 07/10/2016 7:23:24 PM       Radiology Ct Abdomen Pelvis W Contrast  Result Date: 07/10/2016 CLINICAL DATA:  Nausea vomiting and decreased appetite EXAM: CT ABDOMEN AND PELVIS WITH CONTRAST TECHNIQUE: Multidetector CT imaging of the abdomen and pelvis was performed using the standard protocol following bolus administration of intravenous contrast. CONTRAST:  170mL ISOVUE-300 IOPAMIDOL (ISOVUE-300) INJECTION 61% COMPARISON:  None. FINDINGS: Lower chest: Lung bases demonstrate no acute consolidation or pleural effusion. Vascular calcification. No pericardial effusion. Hepatobiliary: Abnormal low-density at the porta hepatis which becomes less apparent on delayed views. Heterogenous low-density in the right hepatic lobe, measuring approximately 7.4 by 6.1 cm. No biliary dilatation. Surgical clips in the gallbladder fossa. Pancreas: Unremarkable. No pancreatic ductal dilatation or surrounding inflammatory changes. Spleen: Normal in size without focal abnormality.  Adrenals/Urinary Tract: 1 cm low-density left adrenal gland nodule. Kidneys demonstrate no hydronephrosis. Punctate stone mid pole left kidney. Probable  cortical scarring in the lower pole of the left kidney. The bladder is unremarkable. Stomach/Bowel: Mild edema and soft tissue thickening at the gastroduodenal junction. No dilated small bowel. Colon diverticular disease. No acute wall thickening. Normal appendix Vascular/Lymphatic: Extensive atherosclerotic vascular calcification. Portal vein at the hilus and the splenic vein appear patent, however low density noted at the porta hepatis in the region of right and left portal vessels. Periportal edema is present. Infrarenal abdominal aortic aneurysm measuring 3.9 cm in maximum diameter. Reproductive: Enlarged prostate gland Other: Small free fluid in the pelvis.  No free air Musculoskeletal: Postsurgical changes at L4-L5. There are degenerative changes. No acute or suspicious bone lesion. IMPRESSION: 1. 7.4 cm heterogenous cystic mass in the right hepatic lobe, this is suspicious for liver abscess in the appropriate clinical setting. Cystic neoplasm also a consideration. 2. Low-density in the porta hepatis could represent altered perfusion ; unable to document patency of the right and left portal veins, consider correlation with Doppler to evaluate for patency of the intrahepatic portal vessels. 3. Small free fluid in the pelvis. Sigmoid colon diverticular disease without acute inflammation 4. Mild soft tissue stranding at the gastroduodenal junction, could relate to mild gastro duodenitis. 5. 3.9 cm infrarenal abdominal aortic aneurysm. Recommend followup by ultrasound in 2 years. This recommendation follows ACR consensus guidelines: White Paper of the ACR Incidental Findings Committee II on Vascular Findings. J Am Coll Radiol 2013; 10:789-794. Electronically Signed   By: Donavan Foil M.D.   On: 07/10/2016 19:38    Procedures Procedures (including critical  care time)  Medications Ordered in ED Medications  0.9 %  sodium chloride infusion (1,000 mLs Intravenous New Bag/Given 07/10/16 2138)  cefTRIAXone (ROCEPHIN) 2 g in dextrose 5 % 50 mL IVPB (2 g Intravenous New Bag/Given 07/10/16 2139)  metroNIDAZOLE (FLAGYL) IVPB 500 mg (500 mg Intravenous New Bag/Given 07/10/16 2139)  HYDROmorphone (DILAUDID) injection 0.5-1 mg (1 mg Intravenous Given 07/10/16 2140)  iopamidol (ISOVUE-300) 61 % injection 100 mL (100 mLs Intravenous Contrast Given 07/10/16 1902)  ondansetron (ZOFRAN) injection 4 mg (4 mg Intravenous Given 07/10/16 1942)  HYDROmorphone (DILAUDID) injection 1 mg (1 mg Intravenous Given 07/10/16 1942)     Initial Impression / Assessment and Plan / ED Course  I have reviewed the triage vital signs and the nursing notes.  Pertinent labs & imaging results that were available during my care of the patient were reviewed by me and considered in my medical decision making (see chart for details).     Patient with history of fevers since early May. Extensive workup outpatient and inpatient without specific findings. Patient had echocardiogram and follow-up with infectious disease pending. [place] primary care doctor yesterday. Patient just not getting any better. Patient's LFTs have been of abnormal. Workup here included CT of the abdomen which showed a 7 cm liver abscess. This probably explains the patient's symptoms. Discussed with the hospitalist they will admit. Probably will require interventional radiology drainage of the abscess.  Patient here vital sign wise not toxic other than slightly tachycardic with a heart rate around 100-112. No hypotension. Lactic acid not elevated. No leukocytosis.    Final Clinical Impressions(s) / ED Diagnoses   Final diagnoses:  Abscess of liver  Fever, unspecified fever cause    New Prescriptions New Prescriptions   No medications on file     Fredia Sorrow, MD 07/10/16 2148

## 2016-07-11 ENCOUNTER — Inpatient Hospital Stay (HOSPITAL_COMMUNITY): Payer: Medicare Other

## 2016-07-11 ENCOUNTER — Ambulatory Visit (HOSPITAL_COMMUNITY): Admission: RE | Admit: 2016-07-11 | Payer: Medicare Other | Source: Ambulatory Visit

## 2016-07-11 DIAGNOSIS — E440 Moderate protein-calorie malnutrition: Secondary | ICD-10-CM | POA: Insufficient documentation

## 2016-07-11 DIAGNOSIS — R509 Fever, unspecified: Secondary | ICD-10-CM

## 2016-07-11 DIAGNOSIS — I714 Abdominal aortic aneurysm, without rupture: Secondary | ICD-10-CM

## 2016-07-11 LAB — IRON AND TIBC
IRON: 6 ug/dL — AB (ref 45–182)
SATURATION RATIOS: 4 % — AB (ref 17.9–39.5)
TIBC: 134 ug/dL — AB (ref 250–450)
UIBC: 128 ug/dL

## 2016-07-11 LAB — VITAMIN B12: VITAMIN B 12: 4419 pg/mL — AB (ref 180–914)

## 2016-07-11 LAB — COMPREHENSIVE METABOLIC PANEL
ALK PHOS: 286 U/L — AB (ref 38–126)
ALT: 52 U/L (ref 17–63)
ANION GAP: 6 (ref 5–15)
AST: 49 U/L — ABNORMAL HIGH (ref 15–41)
Albumin: 1.8 g/dL — ABNORMAL LOW (ref 3.5–5.0)
BILIRUBIN TOTAL: 2.1 mg/dL — AB (ref 0.3–1.2)
BUN: 27 mg/dL — ABNORMAL HIGH (ref 6–20)
CALCIUM: 7.4 mg/dL — AB (ref 8.9–10.3)
CO2: 24 mmol/L (ref 22–32)
Chloride: 111 mmol/L (ref 101–111)
Creatinine, Ser: 0.79 mg/dL (ref 0.61–1.24)
GFR calc Af Amer: >60 mL/min (ref >60)
GFR calc non Af Amer: >60 mL/min (ref >60)
GLUCOSE: 91 mg/dL (ref 65–99)
POTASSIUM: 3.6 mmol/L (ref 3.5–5.1)
Sodium: 141 mmol/L (ref 135–145)
TOTAL PROTEIN: 5 g/dL — AB (ref 6.5–8.1)

## 2016-07-11 LAB — CBC WITH DIFFERENTIAL/PLATELET
Basophils Absolute: 0 10*3/uL (ref 0.0–0.1)
Basophils Relative: 0 %
EOS ABS: 0 10*3/uL (ref 0.0–0.7)
EOS PCT: 0 %
HCT: 30.6 % — ABNORMAL LOW (ref 39.0–52.0)
Hemoglobin: 10.1 g/dL — ABNORMAL LOW (ref 13.0–17.0)
LYMPHS ABS: 0.8 10*3/uL (ref 0.7–4.0)
LYMPHS PCT: 7 %
MCH: 27.4 pg (ref 26.0–34.0)
MCHC: 33 g/dL (ref 30.0–36.0)
MCV: 82.9 fL (ref 78.0–100.0)
MONO ABS: 0.7 10*3/uL (ref 0.1–1.0)
Monocytes Relative: 7 %
Neutro Abs: 9.9 10*3/uL — ABNORMAL HIGH (ref 1.7–7.7)
Neutrophils Relative %: 86 %
PLATELETS: 95 10*3/uL — AB (ref 150–400)
RBC: 3.69 MIL/uL — AB (ref 4.22–5.81)
RDW: 14.9 % (ref 11.5–15.5)
WBC: 11.5 10*3/uL — AB (ref 4.0–10.5)

## 2016-07-11 LAB — FOLATE: FOLATE: 7.4 ng/mL (ref >5.9)

## 2016-07-11 LAB — STREP PNEUMONIAE URINARY ANTIGEN: Strep Pneumo Urinary Antigen: NEGATIVE

## 2016-07-11 LAB — MRSA PCR SCREENING: MRSA by PCR: NEGATIVE

## 2016-07-11 LAB — FERRITIN: Ferritin: 499 ng/mL — ABNORMAL HIGH (ref 24–336)

## 2016-07-11 LAB — PROTIME-INR
INR: 1.41
PROTHROMBIN TIME: 17.3 s — AB (ref 11.4–15.2)

## 2016-07-11 MED ORDER — SODIUM CHLORIDE 0.9 % IV SOLN
1.0000 g | Freq: Three times a day (TID) | INTRAVENOUS | Status: DC
  Administered 2016-07-11 – 2016-07-12 (×3): 1 g via INTRAVENOUS
  Filled 2016-07-11 (×5): qty 1

## 2016-07-11 MED ORDER — FENTANYL CITRATE (PF) 100 MCG/2ML IJ SOLN
INTRAMUSCULAR | Status: AC | PRN
  Administered 2016-07-11: 50 ug via INTRAVENOUS

## 2016-07-11 MED ORDER — MEPERIDINE HCL 100 MG/ML IJ SOLN
INTRAMUSCULAR | Status: DC
Start: 2016-07-11 — End: 2016-07-11
  Filled 2016-07-11: qty 1

## 2016-07-11 MED ORDER — ENOXAPARIN SODIUM 40 MG/0.4ML ~~LOC~~ SOLN
40.0000 mg | SUBCUTANEOUS | Status: DC
  Administered 2016-07-12 – 2016-07-16 (×5): 40 mg via SUBCUTANEOUS
  Filled 2016-07-11 (×5): qty 0.4

## 2016-07-11 MED ORDER — MIDAZOLAM HCL 2 MG/2ML IJ SOLN
INTRAMUSCULAR | Status: AC
  Filled 2016-07-11: qty 6

## 2016-07-11 MED ORDER — VANCOMYCIN HCL IN DEXTROSE 1-5 GM/200ML-% IV SOLN
1000.0000 mg | Freq: Two times a day (BID) | INTRAVENOUS | Status: DC
  Administered 2016-07-11 – 2016-07-12 (×2): 1000 mg via INTRAVENOUS
  Filled 2016-07-11: qty 200

## 2016-07-11 MED ORDER — MIDAZOLAM HCL 2 MG/2ML IJ SOLN
INTRAMUSCULAR | Status: AC | PRN
  Administered 2016-07-11: 1 mg via INTRAVENOUS

## 2016-07-11 MED ORDER — FENTANYL CITRATE (PF) 100 MCG/2ML IJ SOLN
INTRAMUSCULAR | Status: AC
  Filled 2016-07-11: qty 4

## 2016-07-11 MED ORDER — FAMOTIDINE IN NACL 20-0.9 MG/50ML-% IV SOLN
20.0000 mg | Freq: Two times a day (BID) | INTRAVENOUS | Status: DC
  Administered 2016-07-11 – 2016-07-13 (×7): 20 mg via INTRAVENOUS
  Filled 2016-07-11 (×8): qty 50

## 2016-07-11 MED ORDER — ORAL CARE MOUTH RINSE
15.0000 mL | Freq: Two times a day (BID) | OROMUCOSAL | Status: DC
  Administered 2016-07-12 (×2): 15 mL via OROMUCOSAL

## 2016-07-11 MED ORDER — ENOXAPARIN SODIUM 40 MG/0.4ML ~~LOC~~ SOLN: 40.0000 mg | Freq: Every day | SUBCUTANEOUS | Status: DC

## 2016-07-11 MED ORDER — ACETAMINOPHEN 650 MG RE SUPP: 650.0000 mg | RECTAL | Status: DC | PRN

## 2016-07-11 MED ORDER — SODIUM CHLORIDE 0.9 % IV BOLUS (SEPSIS): 250.0000 mL | Freq: Once | INTRAVENOUS | Status: AC

## 2016-07-11 NOTE — Progress Notes (Signed)
Initial Nutrition Assessment  DOCUMENTATION CODES:   Non-severe (moderate) malnutrition in context of acute illness/injury  INTERVENTION:  - Will monitor for needs at follow-up. - Continue to encourage PO intakes once diet able to be resumed.   NUTRITION DIAGNOSIS:   Malnutrition (moderate/non-severe) related to acute illness, poor appetite (fever, chills, unspecific malaise) as evidenced by per patient/family report, energy intake < 75% for > 7 days, percent weight loss.  GOAL:   Patient will meet greater than or equal to 90% of their needs  MONITOR:   PO intake, Weight trends, Labs  REASON FOR ASSESSMENT:   Malnutrition Screening Tool  ASSESSMENT:   76 y.o. male with medical history significant for coronary artery disease, GERD, osteoarthritis, and hypertension, now presenting to the emergency department for evaluation of subjective fevers, chills, loss of appetite with 15 pound weight loss, and nonspecific malaise. Patient reports that he had been in his usual state of health until one month ago when he developed a nonspecific malaise with subjective fevers and chills. He also began to develop a loss of appetite around that time. Since then, he has been evaluated by his PCP and in the emergency department, was treated with a course of antibiotics for possible tickborne illness though titers were negative, and has not experienced any improvement in his symptoms. He reports a 15 pound weight loss over this interval.  Pt seen for MST. BMI indicates normal weight. Pt was on Heart Healthy diet from 8:47 PM-midnight yesterday; no intakes during that short time. Pt having an episode of chills at this time and some slight confusion, per RN. Offered to stop back later today but wife and RN report that pt will be taken to Radiology shortly for a procedure. Reviewed Radiology note from this AM concerning R liver lobe mass concerning for abscess or possible cystic neoplasm.   Unable to perform  nutrition-focused physical assessment at this time. Wife reports that appetite has been decreased over the past 1 month and that pt reports disinterest in eating and that "food does not taste good." She reports that pt has lost 15 lbs in this time frame. Review of weight trends in the chart show 10 lb weight loss (5.6% body weight) in this time frame, which is significant.  Will discuss further at follow-up and also determine interventions (such as snacks, changes in foods ordered, or oral nutrition supplements) at follow-up.   Medications reviewed; 1000 units vitamin D/day, 20 mg IV Pepcid BID, 40 mg oral Protonix/day, 1000 mcg vitamin B12/day.  Labs reviewed; BUN: 27 mg/dL, Ca: 7.4 mg/dL, Alk Phos and AST elevated.   IVF: NS @ 125 mL/hr.    Diet Order:  Diet NPO time specified Except for: Ice Chips, Sips with Meds  Skin:  Reviewed, no issues  Last BM:  PTA/unknown  Height:   Ht Readings from Last 1 Encounters:  07/10/16 '5\' 10"'  (1.778 m)    Weight:   Wt Readings from Last 1 Encounters:  07/10/16 168 lb (76.2 kg)    Ideal Body Weight:  75.45 kg  BMI:  Body mass index is 24.11 kg/m.  Estimated Nutritional Needs:   Kcal:  1675-1905 (22-25 kcal/kg)  Protein:  65-75 grams  Fluid:  >/= 1.8 L/day  EDUCATION NEEDS:   No education needs identified at this time    Jarome Matin, MS, RD, LDN, CNSC Inpatient Clinical Dietitian Pager # [phone] After hours/weekend pager # [phone]

## 2016-07-11 NOTE — Progress Notes (Signed)
RN contacted by CMT regarding patients increased HR 140s-150s. Pt assessed VS stable see flowsheet. HR increased 126 sustained. M.D aware.

## 2016-07-11 NOTE — Consult Note (Signed)
Chief Complaint: Patient was seen in consultation today for image guided aspiration/biopsy and possible drainage of hepatic cystic mass/? abscess Chief Complaint  Patient presents with  . Weakness  . Fever    Referring Physician(s): Opyd,T  Supervising Physician: Jacqulynn Cadet  Patient Status: [facility] - In-pt  History of Present Illness: Brandon [name] is a 76 y.o. male recently admitted with history of fevers, chills, weight loss, night sweats, diminished appetite and elevated LFTs. Subsequent imaging revealed:  1. 7.4 cm heterogenous cystic mass in the right hepatic lobe, this is suspicious for liver abscess in the appropriate clinical setting. Cystic neoplasm also a consideration. 2. Low-density in the porta hepatis could represent altered perfusion ; unable to document patency of the right and left portal veins, consider correlation with Doppler to evaluate for patency of the intrahepatic portal vessels. 3. Small free fluid in the pelvis. Sigmoid colon diverticular disease without acute inflammation 4. Mild soft tissue stranding at the gastroduodenal junction, could relate to mild gastro duodenitis. 5. 3.9 cm infrarenal abdominal aortic aneurysm. Recommend followup by ultrasound in 2 years.  Request now received for image guided aspiration/biopsy/possible drainage of the right hepatic lobe cystic mass/abscess  Past Medical History:  Diagnosis Date  . Aortic stenosis    mild echocardiogram 8/09 EF 60%, showed no regional wall motion abnml, mild LVH, mod focal basal septal hypertrophy and mild dyastolic dysfunction. 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 mean gradient across aortic valve was 8 mmHG. also mild L atrial enlargement and normal RV size and function. minimal AS on LHC in 7/10.   . Arthritis   . BPH (benign prostatic hypertrophy)   . CAD (coronary artery disease)    LCH (7/10) totally occluded proximal RCA with  very robuse L to R collaterals, 50% proximal LAD stenosis, EF 65%, medical management.    . Chicken pox   . Diverticulosis   . Esophageal stricture   . GERD (gastroesophageal reflux disease)   . Hiatal hernia   . HLD (hyperlipidemia)   . HTN (hypertension)   . Low back pain    s/p surgical fusion  . Osteoarthritis   . Smoker   . TIA (transient ischemic attack)    hx  . Tubular adenoma of colon 2009    Past Surgical History:  Procedure Laterality Date  . cataract surgery  01/2012/02/2012   both eyes/lens implanted  . CHOLECYSTECTOMY  1994  . L4-5 discectomy  4/98   Dr. Sherwood Gambler   . lumbar decompression and Ray cage  8/98   L4-5 Dr. Rita Ohara  . LUMBAR LAMINECTOMY  9/04   and microdiscectomy. L3-4. Dr. Sherwood Gambler  . MOUTH SURGERY     FULL DENTURES    Allergies: Celecoxib  Medications: Prior to Admission medications   Medication Sig Start Date End Date Taking? Authorizing Provider  acetaminophen (TYLENOL) 325 MG tablet Take 325 mg by mouth every 6 (six) hours as needed for fever.   Yes [provider]  amLODipine (NORVASC) 5 MG tablet TAKE 1 TABLET(5 MG) BY MOUTH DAILY 06/25/16  Yes Larey Dresser, MD  aspirin 81 MG EC tablet Take 81 mg by mouth daily.     Yes [provider]  atorvastatin (LIPITOR) 40 MG tablet Take 40 mg by mouth daily.   Yes [provider]  benazepril (LOTENSIN) 20 MG tablet TAKE 1 TABLET BY MOUTH DAILY 05/14/16  Yes Brunetta Jeans, PA-C  cholecalciferol (VITAMIN D) 1000  UNITS tablet Take 1,000 Units by mouth daily.    Yes [provider]  diphenhydrAMINE (BENADRYL) 25 MG tablet Take 50 mg by mouth every 6 (six) hours as needed.   Yes [provider]  fluticasone (FLONASE) 50 MCG/ACT nasal spray Place 2 sprays into both nostrils daily as needed. Patient taking differently: Place 2 sprays into both nostrils daily as needed for allergies.  12/07/14  Yes Brunetta Jeans, PA-C  HYDROcodone-acetaminophen  (NORCO/VICODIN) 5-325 MG tablet Take 0.5-1 tablets by mouth every 6 (six) hours as needed for moderate pain. 05/22/16  Yes Brunetta Jeans, PA-C  isosorbide mononitrate (IMDUR) 30 MG 24 hr tablet TAKE 1 TABLET BY MOUTH AT BEDTIME 04/12/16  Yes Brunetta Jeans, PA-C  metoprolol (LOPRESSOR) 50 MG tablet TAKE 1 TABLET BY MOUTH TWICE DAILY 04/13/16  Yes Brunetta Jeans, PA-C  Omega-3 Fatty Acids (FISH OIL) 1000 MG CAPS Take 2 capsules by mouth 2 (two) times daily.     Yes [provider]  omeprazole (PRILOSEC) 20 MG capsule TAKE 1 CAPSULE(20 MG) BY MOUTH DAILY 07/03/16  Yes Ladene Artist, MD  vitamin B-12 (CYANOCOBALAMIN) 1000 MCG tablet Take 1,000 mcg by mouth daily.     Yes [provider]     Family History  Problem Relation Age of Onset  . Heart disease Father 8       Living  . Coronary artery disease Father        CABG  . Alzheimer's disease Mother 10       Deceased  . Arthritis/Rheumatoid Mother   . Stomach cancer Maternal Uncle   . Brain cancer Maternal Aunt        x2  . Arthritis Brother        DJD  . Obesity Daughter        Had Bypass Sx    Social History   Social History  . Marital status: Married    Spouse name: etta  . Number of children: 2  . Years of education: N/A   Occupational History  . retired Retired    disabled due to back problems   Social History Main Topics  . Smoking status: Former Smoker    Packs/day: 1.50    Years: 50.00    Types: Cigarettes    Quit date: 06/2015  . Smokeless tobacco: Former Systems developer     Comment: smoked less than 1 ppd for 40+ years; Using Administrator, sports  . Alcohol use No     Comment: social   . Drug use: No  . Sexual activity: Not Asked   Other Topics Concern  . None   Social History Narrative   Married (3rd), [place]. 2 children from 1st marriage, 4 step children.    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       Cell #  [id number]      Review of Systems see above; currently denies chest pain, worsening dyspnea, nausea, vomiting or abnormal bleeding.  Vital Signs: BP 115/65 (BP Location: Left Arm)   Pulse 85   Temp 98.3 F (36.8 C) (Oral)   Resp (!) 21   Ht 5\' 10"  (1.778 m)   Wt 168 lb (76.2 kg)   SpO2 95%   BMI 24.11 kg/m   Physical Exam awake, alert. Chest clear to auscultation bilaterally. Heart with regular rate, some occasional ectopy, positive murmur. Abdomen soft, positive bowel  sounds, nontender. Lower extremities with no edema, intact distal pulses noted.  Mallampati Score:     Imaging: Dg Chest 2 View  Result Date: 07/06/2016 CLINICAL DATA:  Acute onset of fever and nausea.  Initial encounter. EXAM: CHEST  2 VIEW COMPARISON:  Chest radiograph from 06/16/2016 FINDINGS: The lungs are well-aerated and clear. There is no evidence of focal opacification, pleural effusion or pneumothorax. The heart is normal in size; the mediastinal contour is within normal limits. No acute osseous abnormalities are seen. IMPRESSION: No acute cardiopulmonary process seen. Electronically Signed   By: Garald Balding M.D.   On: 07/06/2016 22:23   Dg Chest 2 View  Result Date: 06/16/2016 CLINICAL DATA:  Joint pain and swelling, nausea, aortic stenosis. EXAM: CHEST  2 VIEW COMPARISON:  04/17/2013. FINDINGS: Trachea is midline. Heart size normal. Thoracic aorta is calcified. Lungs are clear. No pleural fluid. Mild degenerative changes are seen in the spine. IMPRESSION: 1. No acute findings. 2.  Aortic atherosclerosis (ICD10-170.0). Electronically Signed   By: Lorin Picket M.D.   On: 06/16/2016 10:28   Ct Abdomen Pelvis W Contrast  Result Date: 07/10/2016 CLINICAL DATA:  Nausea vomiting and decreased appetite EXAM: CT ABDOMEN AND PELVIS WITH CONTRAST TECHNIQUE: Multidetector CT imaging of the abdomen and pelvis was performed using the standard protocol following bolus administration of intravenous contrast. CONTRAST:   133mL ISOVUE-300 IOPAMIDOL (ISOVUE-300) INJECTION 61% COMPARISON:  None. FINDINGS: Lower chest: Lung bases demonstrate no acute consolidation or pleural effusion. Vascular calcification. No pericardial effusion. Hepatobiliary: Abnormal low-density at the porta hepatis which becomes less apparent on delayed views. Heterogenous low-density in the right hepatic lobe, measuring approximately 7.4 by 6.1 cm. No biliary dilatation. Surgical clips in the gallbladder fossa. Pancreas: Unremarkable. No pancreatic ductal dilatation or surrounding inflammatory changes. Spleen: Normal in size without focal abnormality. Adrenals/Urinary Tract: 1 cm low-density left adrenal gland nodule. Kidneys demonstrate no hydronephrosis. Punctate stone mid pole left kidney. Probable cortical scarring in the lower pole of the left kidney. The bladder is unremarkable. Stomach/Bowel: Mild edema and soft tissue thickening at the gastroduodenal junction. No dilated small bowel. Colon diverticular disease. No acute wall thickening. Normal appendix Vascular/Lymphatic: Extensive atherosclerotic vascular calcification. Portal vein at the hilus and the splenic vein appear patent, however low density noted at the porta hepatis in the region of right and left portal vessels. Periportal edema is present. Infrarenal abdominal aortic aneurysm measuring 3.9 cm in maximum diameter. Reproductive: Enlarged prostate gland Other: Small free fluid in the pelvis.  No free air Musculoskeletal: Postsurgical changes at L4-L5. There are degenerative changes. No acute or suspicious bone lesion. IMPRESSION: 1. 7.4 cm heterogenous cystic mass in the right hepatic lobe, this is suspicious for liver abscess in the appropriate clinical setting. Cystic neoplasm also a consideration. 2. Low-density in the porta hepatis could represent altered perfusion ; unable to document patency of the right and left portal veins, consider correlation with Doppler to evaluate for patency of  the intrahepatic portal vessels. 3. Small free fluid in the pelvis. Sigmoid colon diverticular disease without acute inflammation 4. Mild soft tissue stranding at the gastroduodenal junction, could relate to mild gastro duodenitis. 5. 3.9 cm infrarenal abdominal aortic aneurysm. Recommend followup by ultrasound in 2 years. This recommendation follows ACR consensus guidelines: White Paper of the ACR Incidental Findings Committee II on Vascular Findings. J Am Coll Radiol 2013; 10:789-794. Electronically Signed   By: Donavan Foil M.D.   On: 07/10/2016 19:38   Dg Hip Unilat With Pelvis  2-3 Views Right  Result Date: 06/13/2016 CLINICAL DATA:  Chronic right hip pain without recent injury. EXAM: DG HIP (WITH OR WITHOUT PELVIS) 2-3V RIGHT COMPARISON:  None. FINDINGS: There is no evidence of hip fracture or dislocation. There is no evidence of arthropathy or other focal bone abnormality. IMPRESSION: Normal right hip. Electronically Signed   By: Marijo Conception, M.D.   On: 06/13/2016 12:52   US Abdomen Limited Ruq  Result Date: 07/10/2016 CLINICAL DATA:  Abnormal CT.  Portal vein obstruction. EXAM: ULTRASOUND ABDOMEN LIMITED RIGHT UPPER QUADRANT COMPARISON:  Abdominal CT earlier this day. FINDINGS: Gallbladder: Surgically absent. Common bile duct: Diameter: 5.7 mm, normal. Liver: Heterogeneous 7.0 x 7.5 x 6.4 cm lesion in the right hepatic lobe corresponding to abnormality on CT. There is an internal cystic components. Hepatic echotexture is heterogeneous, particularly centrally at the porta hepatis. There is normal directional flow of the portal vein at the porta hepatis, however possible cut off with questionable filling defect and surrounding collaterals at the porta hepatis. The central and left hepatic veins are visualized and are patent. IMPRESSION: 1. Questionable filling defect in the portal vein at the porta hepatis with surrounding collaterals, extrahepatic portal vein demonstrates normal directional flow. 2.  Heterogeneous 7 x 7.5 x 6.4 cm lesion in the right hepatic lobe with some internal cystic components. Recommend MRI characterization with contrast. Electronically Signed   By: Jeb Levering M.D.   On: 07/10/2016 22:14    Labs:  CBC:  Recent Labs  07/06/16 2122 07/09/16 1520 07/10/16 1735 07/11/16 0517  WBC 13.0* 13.4* 10.0 11.5*  HGB 12.3* 11.7* 10.3* 10.1*  HCT 37.5* 35.7* 30.9* 30.6*  PLT 165 115.0* 118* 95*    COAGS:  Recent Labs  07/06/16 2122 07/11/16 0517  INR 1.25 1.41    BMP:  Recent Labs  06/16/16 0920 07/04/16 1052 07/06/16 2122 07/10/16 1735 07/11/16 0517  NA 136 138 137 140 141  K 3.3* 4.0 3.3* 3.7 3.6  CL 104 102 102 107 111  CO2 25 30 25 24 24   GLUCOSE 171* 122* 116* 120* 91  BUN 9 8 12  35* 27*  CALCIUM 8.6* 9.0 8.5* 7.8* 7.4*  CREATININE 0.82 0.76 0.89 0.95 0.79  GFRNONAA >60  --  >60 >60 >60  GFRAA >60  --  >60 >60 >60    LIVER FUNCTION TESTS:  Recent Labs  07/06/16 2122 07/09/16 1520 07/10/16 1735 07/11/16 0517  BILITOT 1.2 1.3* 1.9* 2.1*  AST 28 77* 47* 49*  ALT 28 86* 56 52  ALKPHOS 196* 247* 310* 286*  PROT 7.1 5.5* 5.3* 5.0*  ALBUMIN 3.1* 2.9* 2.1* 1.8*    TUMOR MARKERS: No results for input(s): AFPTM, CEA, CA199, CHROMGRNA in the last 8760 hours.  Assessment and Plan: 76 y.o. male recently admitted with history of fevers, chills, weight loss, night sweats, diminished appetite and elevated LFTs. Subsequent imaging revealed:  1. 7.4 cm heterogenous cystic mass in the right hepatic lobe, this is suspicious for liver abscess in the appropriate clinical setting. Cystic neoplasm also a consideration. 2. Low-density in the porta hepatis could represent altered perfusion ; unable to document patency of the right and left portal veins, consider correlation with Doppler to evaluate for patency of the intrahepatic portal vessels. 3. Small free fluid in the pelvis. Sigmoid colon diverticular disease without acute  inflammation 4. Mild soft tissue stranding at the gastroduodenal junction, could relate to mild gastro duodenitis. 5. 3.9 cm infrarenal abdominal aortic aneurysm. Recommend followup by  ultrasound in 2 years.  Request now received for image guided aspiration/biopsy/possible drainage of the right hepatic lobe cystic mass/abscess. Imaging studies have been reviewed by Dr. Laurence Ferrari. Details/risks of procedure, including but not limited to, internal bleeding, infection, injury to adjacent structures, discussed with patient and wife with their understanding and consent. Procedure tentatively scheduled for later today.    Thank you for this interesting consult.  I greatly enjoyed meeting SANTANA EDELL and look forward to participating in their care.  A copy of this report was sent to the requesting provider on this date.  Electronically Signed: D. Rowe Robert, PA-C 07/11/2016, 9:57 AM   I spent a total of 25 minutes  in face to face in clinical consultation, greater than 50% of which was counseling/coordinating care for image guided aspiration/biopsy/possible drainage of hepatic cystic mass/abscess

## 2016-07-11 NOTE — Progress Notes (Signed)
PROGRESS NOTE    Brandon [name]  JKK:[id number] DOB: [birth year] DOA: 07/10/2016 PCP: Brunetta Jeans, PA-C    Brief Narrative:  76 y.o. male with medical history significant for coronary artery disease, GERD, osteoarthritis, and hypertension, now presenting to the emergency department for evaluation of subjective fevers, chills, loss of appetite with 15 pound weight loss, and nonspecific malaise. Patient reports that he had been in his usual state of health until one month ago when he developed a nonspecific malaise with subjective fevers and chills. He also began to develop a loss of appetite around that time. Since then, he has been evaluated by his PCP and in the emergency department, was treated with a course of antibiotics for possible tickborne illness though titers were negative, and has not experienced any improvement in his symptoms. He reports a 15 pound weight loss over this interval. He notes a long-standing history of occasional loose stools, but denies any significant diarrhea or constipation. He denies any significant abdominal pain, reports mild nausea, but denies vomiting. There has not been any significant cough or dyspnea and no chest pain or palpitations. He denies any recent long distance travel, GI tract instrumentation, or sick contacts. He denies melena or hematochezia. He had a colonoscopy in February with polyps with pathology report indicating no high-grade dysplasia or malignancy  Assessment & Plan:   Principal Problem:   Liver abscess Active Problems:   CAD, NATIVE VESSEL   GERD   Hypertension   Aneurysm of infrarenal abdominal aorta (HCC)   Normocytic anemia   Thrombocytopenia (HCC)  1. Liver mass, likely liver abscess with sepsis present on admission - Pt presents tachycardia, fevers with CT reviewed. Findings suggestive of liver abscess, however cystic neoplasm also considered - He is noted to have 15-lb wt-loss and developed a new anemia over this interval  -  Blood cultures from 07/06/16 have NGTD; blood cultures were collected again in ED, reviewed. Thus far neg - Initially started on Rocephin and Flagyl, request CT-guided aspiration, continue supportive care with IVF, analgesia, antiemetics  - Today, patient's sepsis appears worse with increased HR and fevers.  -Will change abx to vanc and meropenem.   2. Anemia, thrombocytopenia  - Hgb is 10.3 on admission, progressively worsening over past month, was wnl in early May '18  - Platelets 118k on admission - No bleeding is evident, no pallor; pt denies melena or hematochezia  - Colonoscopy in Feb '18 with 2 polyps, path report no high-grade dysplasia or malignancy  - stable thus far - Repeat CBC in AM  3. CAD - No anginal complaints  - Continue Lopressor and Imdur  - Hold benazepril given soft BP and dehydration with elevated BUN  - Hold Lipitor in light of elevated LFT's -Currently stable  4. Hypertension  - BP soft but stable in ED  - Continuing Norvasc as tolerated; holding benazepril in setting of dehydration   5. GERD - EGD in Feb '18 with stricture at GE junction and hiatal hernia  - He is managed at home with daily PPI - Cont PPI as tolerated   6. AAA  - 3.9 cm infrarenal AAA noted incidentally on CT abd/pelvis  - Follow-up in 2 years with abd [place] is advised per recommendations  DVT prophylaxis: Lovenox subQ Code Status: Full Family Communication: Pt in room, family at bedside Disposition Plan: Uncertain at this time  Consultants:   IR  Procedures:     Antimicrobials: Anti-infectives    Start  Dose/Rate Route Frequency Ordered Stop   07/10/16 2100  cefTRIAXone (ROCEPHIN) 2 g in dextrose 5 % 50 mL IVPB  Status:  Discontinued     2 g 100 mL/hr over 30 Minutes Intravenous Every 24 hours 07/10/16 2045 07/11/16 1508   07/10/16 2100  metroNIDAZOLE (FLAGYL) IVPB 500 mg  Status:  Discontinued     500 mg 100 mL/hr over 60 Minutes Intravenous Every 8 hours 07/10/16  2045 07/11/16 1508       Subjective: Complains of chills  Objective: Vitals:   07/11/16 0000 07/11/16 0517 07/11/16 1321 07/11/16 1448  BP: 126/62 115/65 (!) 159/77 (!) 141/77  Pulse: (!) 120 85 76 (!) 128  Resp:  (!) 21    Temp:  98.3 F (36.8 C) 99.7 F (37.6 C) (!) 103 F (39.4 C)  TempSrc:  Oral Oral Oral  SpO2:  95%  91%  Weight:      Height:        Intake/Output Summary (Last 24 hours) at 07/11/16 1509 Last data filed at 07/11/16 1451  Gross per 24 hour  Intake          1727.08 ml  Output              750 ml  Net           977.08 ml   Filed Weights   07/10/16 1628 07/10/16 1819  Weight: 76.2 kg (168 lb) 76.2 kg (168 lb)    Examination:  General exam: Appears calm and comfortable  Respiratory system: Clear to auscultation. Respiratory effort normal. Cardiovascular system: S1 & S2 heard, RRR. No JVD, murmurs, rubs, gallops or clicks. No pedal edema. Gastrointestinal system: Abdomen is nondistended, soft and nontender. No organomegaly or masses felt. Normal bowel sounds heard. Central nervous system: Alert and oriented. No focal neurological deficits. Extremities: Symmetric 5 x 5 power. Skin: No rashes, lesions  Psychiatry: Judgement and insight appear normal. Mood & affect appropriate.   Data Reviewed: I have personally reviewed following labs and imaging studies  CBC:  Recent Labs Lab 07/06/16 2122 07/09/16 1520 07/10/16 1735 07/11/16 0517  WBC 13.0* 13.4* 10.0 11.5*  NEUTROABS 11.0* 11.5* 8.5* 9.9*  HGB 12.3* 11.7* 10.3* 10.1*  HCT 37.5* 35.7* 30.9* 30.6*  MCV 84.1 83.0 82.0 82.9  PLT 165 115.0* 118* 95*   Basic Metabolic Panel:  Recent Labs Lab 07/06/16 2122 07/10/16 1735 07/11/16 0517  NA 137 140 141  K 3.3* 3.7 3.6  CL 102 107 111  CO2 25 24 24   GLUCOSE 116* 120* 91  BUN 12 35* 27*  CREATININE 0.89 0.95 0.79  CALCIUM 8.5* 7.8* 7.4*   GFR: Estimated Creatinine Clearance: 84.9 mL/min (by C-G formula based on SCr of 0.79  mg/dL). Liver Function Tests:  Recent Labs Lab 07/06/16 2122 07/09/16 1520 07/10/16 1735 07/11/16 0517  AST 28 77* 47* 49*  ALT 28 86* 56 52  ALKPHOS 196* 247* 310* 286*  BILITOT 1.2 1.3* 1.9* 2.1*  PROT 7.1 5.5* 5.3* 5.0*  ALBUMIN 3.1* 2.9* 2.1* 1.8*   No results for input(s): LIPASE, AMYLASE in the last 168 hours. No results for input(s): AMMONIA in the last 168 hours. Coagulation Profile:  Recent Labs Lab 07/06/16 2122 07/11/16 0517  INR 1.25 1.41   Cardiac Enzymes: No results for input(s): CKTOTAL, CKMB, CKMBINDEX, TROPONINI in the last 168 hours. BNP (last 3 results) No results for input(s): PROBNP in the last 8760 hours. HbA1C: No results for input(s): HGBA1C in the last 72  hours. CBG: No results for input(s): GLUCAP in the last 168 hours. Lipid Profile: No results for input(s): CHOL, HDL, LDLCALC, TRIG, CHOLHDL, LDLDIRECT in the last 72 hours. Thyroid Function Tests: No results for input(s): TSH, T4TOTAL, FREET4, T3FREE, THYROIDAB in the last 72 hours. Anemia Panel:  Recent Labs  07/10/16 2255  VITAMINB12 4,419*  FOLATE 7.4  FERRITIN 499*  TIBC 134*  IRON 6*  RETICCTPCT 0.5   Sepsis Labs:  Recent Labs Lab 07/06/16 2144 07/10/16 1746  LATICACIDVEN 1.23 1.31    Recent Results (from the past 240 hour(s))  Culture, blood (Routine x 2)     Status: None (Preliminary result)   Collection Time: 07/06/16  9:22 PM  Result Value Ref Range Status   Specimen Description BLOOD LEFT FOREARM  Final   Special Requests BOTTLES DRAWN AEROBIC AND ANAEROBIC BCAV  Final   Culture   Final    NO GROWTH 4 DAYS Performed at [facility], [place] [address] [place], [place] [address]    Report Status PENDING  Incomplete  Culture, blood (Routine x 2)     Status: None (Preliminary result)   Collection Time: 07/06/16  9:27 PM  Result Value Ref Range Status   Specimen Description BLOOD RAC  Final   Special Requests BOTTLES DRAWN AEROBIC AND ANAEROBIC BCAV  Final    Culture   Final    NO GROWTH 4 DAYS Performed at [facility], [place] [address], [place], [place] [address]    Report Status PENDING  Incomplete  Blood Culture (routine x 2)     Status: None (Preliminary result)   Collection Time: 07/10/16  5:35 PM  Result Value Ref Range Status   Specimen Description BLOOD LEFT ARM  Final   Special Requests   Final    BOTTLES DRAWN AEROBIC AND ANAEROBIC Blood Culture adequate volume   Culture   Final    NO GROWTH < 24 HOURS Performed at [facility], [place] [address] [place], [place] [address]    Report Status PENDING  Incomplete  Blood Culture (routine x 2)     Status: None (Preliminary result)   Collection Time: 07/10/16  5:43 PM  Result Value Ref Range Status   Specimen Description BLOOD RIGHT HAND  Final   Special Requests   Final    BOTTLES DRAWN AEROBIC AND ANAEROBIC Blood Culture adequate volume   Culture   Final    NO GROWTH < 24 HOURS Performed at [facility], [place] [address] [place], [place] [address]    Report Status PENDING  Incomplete     Radiology Studies: Ct Abdomen Pelvis W Contrast  Result Date: 07/10/2016 CLINICAL DATA:  Nausea vomiting and decreased appetite EXAM: CT ABDOMEN AND PELVIS WITH CONTRAST TECHNIQUE: Multidetector CT imaging of the abdomen and pelvis was performed using the standard protocol following bolus administration of intravenous contrast. CONTRAST:  138mL ISOVUE-300 IOPAMIDOL (ISOVUE-300) INJECTION 61% COMPARISON:  None. FINDINGS: Lower chest: Lung bases demonstrate no acute consolidation or pleural effusion. Vascular calcification. No pericardial effusion. Hepatobiliary: Abnormal low-density at the porta hepatis which becomes less apparent on delayed views. Heterogenous low-density in the right hepatic lobe, measuring approximately 7.4 by 6.1 cm. No biliary dilatation. Surgical clips in the gallbladder fossa. Pancreas: Unremarkable. No pancreatic ductal dilatation or surrounding inflammatory  changes. Spleen: Normal in size without focal abnormality. Adrenals/Urinary Tract: 1 cm low-density left adrenal gland nodule. Kidneys demonstrate no hydronephrosis. Punctate stone mid pole left kidney. Probable cortical  scarring in the lower pole of the left kidney. The bladder is unremarkable. Stomach/Bowel: Mild edema and soft tissue thickening at the gastroduodenal junction. No dilated small bowel. Colon diverticular disease. No acute wall thickening. Normal appendix Vascular/Lymphatic: Extensive atherosclerotic vascular calcification. Portal vein at the hilus and the splenic vein appear patent, however low density noted at the porta hepatis in the region of right and left portal vessels. Periportal edema is present. Infrarenal abdominal aortic aneurysm measuring 3.9 cm in maximum diameter. Reproductive: Enlarged prostate gland Other: Small free fluid in the pelvis.  No free air Musculoskeletal: Postsurgical changes at L4-L5. There are degenerative changes. No acute or suspicious bone lesion. IMPRESSION: 1. 7.4 cm heterogenous cystic mass in the right hepatic lobe, this is suspicious for liver abscess in the appropriate clinical setting. Cystic neoplasm also a consideration. 2. Low-density in the porta hepatis could represent altered perfusion ; unable to document patency of the right and left portal veins, consider correlation with Doppler to evaluate for patency of the intrahepatic portal vessels. 3. Small free fluid in the pelvis. Sigmoid colon diverticular disease without acute inflammation 4. Mild soft tissue stranding at the gastroduodenal junction, could relate to mild gastro duodenitis. 5. 3.9 cm infrarenal abdominal aortic aneurysm. Recommend followup by ultrasound in 2 years. This recommendation follows ACR consensus guidelines: White Paper of the ACR Incidental Findings Committee II on Vascular Findings. J Am Coll Radiol 2013; 10:789-794. Electronically Signed   By: Donavan Foil M.D.   On: 07/10/2016  19:38   US Abdomen Limited Ruq  Result Date: 07/10/2016 CLINICAL DATA:  Abnormal CT.  Portal vein obstruction. EXAM: ULTRASOUND ABDOMEN LIMITED RIGHT UPPER QUADRANT COMPARISON:  Abdominal CT earlier this day. FINDINGS: Gallbladder: Surgically absent. Common bile duct: Diameter: 5.7 mm, normal. Liver: Heterogeneous 7.0 x 7.5 x 6.4 cm lesion in the right hepatic lobe corresponding to abnormality on CT. There is an internal cystic components. Hepatic echotexture is heterogeneous, particularly centrally at the porta hepatis. There is normal directional flow of the portal vein at the porta hepatis, however possible cut off with questionable filling defect and surrounding collaterals at the porta hepatis. The central and left hepatic veins are visualized and are patent. IMPRESSION: 1. Questionable filling defect in the portal vein at the porta hepatis with surrounding collaterals, extrahepatic portal vein demonstrates normal directional flow. 2. Heterogeneous 7 x 7.5 x 6.4 cm lesion in the right hepatic lobe with some internal cystic components. Recommend MRI characterization with contrast. Electronically Signed   By: Jeb Levering M.D.   On: 07/10/2016 22:14    Scheduled Meds: . amLODipine  5 mg Oral Daily  . cholecalciferol  1,000 Units Oral Daily  . [START ON 07/12/2016] enoxaparin (LOVENOX) injection  40 mg Subcutaneous QHS  . isosorbide mononitrate  30 mg Oral QHS  . metoprolol tartrate  50 mg Oral BID  . pantoprazole  40 mg Oral Daily  . sodium chloride flush  3 mL Intravenous Q12H  . vitamin B-12  1,000 mcg Oral Daily   Continuous Infusions: . sodium chloride 1,000 mL (07/11/16 0957)  . famotidine (PEPCID) IV Stopped (07/11/16 1127)     LOS: 1 day   CHIU, Orpah Melter, MD [facility] Pager [phone]  If 7PM-7AM, please contact night-coverage [email or web address] Password [email or web address] 07/11/2016, 3:09 PM

## 2016-07-11 NOTE — Progress Notes (Signed)
Pharmacy Antibiotic Note  Brandon [name] is a 76 y.o. male presented to the ED on 07/10/16 with c/o fever, chills and decreased appetite.  Abd CT on 07/10/16 showed 7.4 cm cystic mass in the right hepatic lobe with concern for liver abscess.  Ceftriaxone and flagyl started on admission for IAI.  Consulting IR for aspiration/biopsy and possible drainage of hepatic mass/abscess. Per Dr. Wyline Copas, pt seems to be septic on 07/11/16-- to change abx to vancomycin and merrem.  - Tmax 103 - wbc up 11.5 - scr 0.79 (crcl~85)  Plan: - vancomycin 1000 mg IV q12h - merrem 1gm IV q8h  ________________________  Height: 5\' 10"  (177.8 cm) Weight: 168 lb (76.2 kg) IBW/kg (Calculated) : 73  Temp (24hrs), Avg:99.4 F (37.4 C), Min:97.9 F (36.6 C), Max:103 F (39.4 C)   Recent Labs Lab 07/06/16 2122 07/06/16 2144 07/09/16 1520 07/10/16 1735 07/10/16 1746 07/11/16 0517  WBC 13.0*  --  13.4* 10.0  --  11.5*  CREATININE 0.89  --   --  0.95  --  0.79  LATICACIDVEN  --  1.23  --   --  1.31  --     Estimated Creatinine Clearance: 84.9 mL/min (by C-G formula based on SCr of 0.79 mg/dL).    Allergies  Allergen Reactions  . Celecoxib     REACTION: hives and itching     Thank you for allowing pharmacy to be a part of this patient's care.  Lynelle Doctor 07/11/2016 3:09 PM

## 2016-07-11 NOTE — Procedures (Signed)
Interventional Radiology Procedure Note  Procedure: US guided placement of a 29F drain into the complex right hepatic mass.  Aspiration yields 90 mL frankly purulent, bloody fluid. Drain left to JP bulb.  Complications: None  Estimated Blood Loss: None  Recommendations: - Cultures pending - Watch for bacteremia - Drain clinic follow-up in 4 weeks  Signed,  Criselda Peaches, MD

## 2016-07-11 NOTE — Care Management Note (Signed)
Case Management Note  Patient Details  Name: Brandon [name] MRN: [id number] Date of Birth: 17-Feb-1940  Subjective/Objective:  76 y/o m admitted w/Liver abscess. Sepsis. From home.IR-drain.                   Action/Plan:d/c plan home.   Expected Discharge Date:   (unknown)               Expected Discharge Plan:  Home/Self Care  In-House Referral:     Discharge planning Services  CM Consult  Post Acute Care Choice:    Choice offered to:     DME Arranged:    DME Agency:     HH Arranged:    HH Agency:     Status of Service:  In process, will continue to follow  If discussed at Long Length of Stay Meetings, dates discussed:    Additional Comments:  Dessa Phi, RN 07/11/2016, 3:29 PM

## 2016-07-11 NOTE — Progress Notes (Signed)
Report called to Wren, RN in stepdown.  Pt transported to stepdown by RN and NT. Stacey Drain

## 2016-07-11 NOTE — Progress Notes (Signed)
MEDICATION-RELATED CONSULT NOTE   IR Procedure Consult - Anticoagulant/Antiplatelet PTA/Inpatient Med List Review by Pharmacist    Procedure: heparin mass aspiration and drain placement    Completed: 07/11/16 at 1720  Post-Procedural bleeding risk per IR MD assessment:  standard  Antithrombotic medications on inpatient or PTA profile prior to procedure:   lovenox 40 mg daily    Recommended restart time per IR Post-Procedure Guidelines:  AM of day #1 post procedure    Plan:     - adjusted lovenox 40 mg SQ q24h start time to 11AM on 07/12/16 - pharmacy will sign off. Re-consult [place] if need further assistance  Dia Sitter, PharmD, BCPS 07/11/2016 5:50 PM

## 2016-07-12 DIAGNOSIS — I10 Essential (primary) hypertension: Secondary | ICD-10-CM

## 2016-07-12 LAB — BLOOD CULTURE ID PANEL (REFLEXED)
ACINETOBACTER BAUMANNII: NOT DETECTED
CANDIDA ALBICANS: NOT DETECTED
CANDIDA GLABRATA: NOT DETECTED
Candida krusei: NOT DETECTED
Candida parapsilosis: NOT DETECTED
Candida tropicalis: NOT DETECTED
ENTEROBACTERIACEAE SPECIES: NOT DETECTED
Enterobacter cloacae complex: NOT DETECTED
Enterococcus species: NOT DETECTED
Escherichia coli: NOT DETECTED
HAEMOPHILUS INFLUENZAE: NOT DETECTED
Klebsiella oxytoca: NOT DETECTED
Klebsiella pneumoniae: NOT DETECTED
Listeria monocytogenes: NOT DETECTED
NEISSERIA MENINGITIDIS: NOT DETECTED
PSEUDOMONAS AERUGINOSA: NOT DETECTED
Proteus species: NOT DETECTED
STAPHYLOCOCCUS SPECIES: NOT DETECTED
STREPTOCOCCUS AGALACTIAE: NOT DETECTED
STREPTOCOCCUS SPECIES: DETECTED — AB
Serratia marcescens: NOT DETECTED
Staphylococcus aureus (BCID): NOT DETECTED
Streptococcus pneumoniae: NOT DETECTED
Streptococcus pyogenes: NOT DETECTED

## 2016-07-12 LAB — CULTURE, BLOOD (ROUTINE X 2)
Culture: NO GROWTH
Culture: NO GROWTH

## 2016-07-12 LAB — COMPREHENSIVE METABOLIC PANEL
ALK PHOS: 309 U/L — AB (ref 38–126)
ALT: 45 U/L (ref 17–63)
AST: 41 U/L (ref 15–41)
Albumin: 1.9 g/dL — ABNORMAL LOW (ref 3.5–5.0)
Anion gap: 5 (ref 5–15)
BILIRUBIN TOTAL: 1.8 mg/dL — AB (ref 0.3–1.2)
BUN: 29 mg/dL — AB (ref 6–20)
CALCIUM: 7.9 mg/dL — AB (ref 8.9–10.3)
CO2: 26 mmol/L (ref 22–32)
CREATININE: 0.89 mg/dL (ref 0.61–1.24)
Chloride: 111 mmol/L (ref 101–111)
GFR calc Af Amer: >60 mL/min (ref >60)
Glucose, Bld: 110 mg/dL — ABNORMAL HIGH (ref 65–99)
Potassium: 3.3 mmol/L — ABNORMAL LOW (ref 3.5–5.1)
Sodium: 142 mmol/L (ref 135–145)
TOTAL PROTEIN: 5.2 g/dL — AB (ref 6.5–8.1)

## 2016-07-12 LAB — CBC
HCT: 30.3 % — ABNORMAL LOW (ref 39.0–52.0)
Hemoglobin: 9.9 g/dL — ABNORMAL LOW (ref 13.0–17.0)
MCH: 27.4 pg (ref 26.0–34.0)
MCHC: 32.7 g/dL (ref 30.0–36.0)
MCV: 83.9 fL (ref 78.0–100.0)
PLATELETS: 114 10*3/uL — AB (ref 150–400)
RBC: 3.61 MIL/uL — ABNORMAL LOW (ref 4.22–5.81)
RDW: 15.2 % (ref 11.5–15.5)
WBC: 11.8 10*3/uL — AB (ref 4.0–10.5)

## 2016-07-12 LAB — E. HISTOLYTICA ANTIBODY (AMOEBA AB): E histolytica Ab: NEGATIVE

## 2016-07-12 LAB — AMMONIA: AMMONIA: 39 umol/L — AB (ref 9–35)

## 2016-07-12 MED ORDER — METRONIDAZOLE IN NACL 5-0.79 MG/ML-% IV SOLN
500.0000 mg | Freq: Three times a day (TID) | INTRAVENOUS | Status: DC
  Administered 2016-07-12 – 2016-07-14 (×6): 500 mg via INTRAVENOUS
  Filled 2016-07-12 (×11): qty 100

## 2016-07-12 MED ORDER — NICOTINE 14 MG/24HR TD PT24
14.0000 mg | MEDICATED_PATCH | Freq: Every day | TRANSDERMAL | Status: DC
  Administered 2016-07-12 – 2016-07-16 (×5): 14 mg via TRANSDERMAL
  Filled 2016-07-12 (×5): qty 1

## 2016-07-12 MED ORDER — DEXTROSE 5 % IV SOLN
2.0000 g | INTRAVENOUS | Status: DC
  Administered 2016-07-12 – 2016-07-16 (×5): 2 g via INTRAVENOUS
  Filled 2016-07-12 (×6): qty 2

## 2016-07-12 NOTE — Progress Notes (Signed)
Referring Physician(s): Opyd,T/Chiu,S  Supervising Physician: Arne Cleveland  Patient Status:  [facility] - In-pt  Chief Complaint:  Hepatic abscess  Subjective: Pt feeling better since hepatic drain placed yesterday; has had some confusion over last few hours though-?ICU delirium vs previous pain meds vs encephalopathy; denies N/V/fever/chills   Allergies: Celecoxib  Medications: Prior to Admission medications   Medication Sig Start Date End Date Taking? Authorizing Provider  acetaminophen (TYLENOL) 325 MG tablet Take 325 mg by mouth every 6 (six) hours as needed for fever.   Yes [provider]  amLODipine (NORVASC) 5 MG tablet TAKE 1 TABLET(5 MG) BY MOUTH DAILY 06/25/16  Yes Larey Dresser, MD  aspirin 81 MG EC tablet Take 81 mg by mouth daily.     Yes [provider]  atorvastatin (LIPITOR) 40 MG tablet Take 40 mg by mouth daily.   Yes [provider]  benazepril (LOTENSIN) 20 MG tablet TAKE 1 TABLET BY MOUTH DAILY 05/14/16  Yes Brunetta Jeans, PA-C  cholecalciferol (VITAMIN D) 1000 UNITS tablet Take 1,000 Units by mouth daily.    Yes [provider]  diphenhydrAMINE (BENADRYL) 25 MG tablet Take 50 mg by mouth every 6 (six) hours as needed.   Yes [provider]  fluticasone (FLONASE) 50 MCG/ACT nasal spray Place 2 sprays into both nostrils daily as needed. Patient taking differently: Place 2 sprays into both nostrils daily as needed for allergies.  12/07/14  Yes Brunetta Jeans, PA-C  HYDROcodone-acetaminophen (NORCO/VICODIN) 5-325 MG tablet Take 0.5-1 tablets by mouth every 6 (six) hours as needed for moderate pain. 05/22/16  Yes Brunetta Jeans, PA-C  isosorbide mononitrate (IMDUR) 30 MG 24 hr tablet TAKE 1 TABLET BY MOUTH AT BEDTIME 04/12/16  Yes Brunetta Jeans, PA-C  metoprolol (LOPRESSOR) 50 MG tablet TAKE 1 TABLET BY MOUTH TWICE DAILY 04/13/16  Yes Brunetta Jeans, PA-C  Omega-3 Fatty Acids (FISH OIL) 1000 MG CAPS Take 2  capsules by mouth 2 (two) times daily.     Yes [provider]  omeprazole (PRILOSEC) 20 MG capsule TAKE 1 CAPSULE(20 MG) BY MOUTH DAILY 07/03/16  Yes Ladene Artist, MD  vitamin B-12 (CYANOCOBALAMIN) 1000 MCG tablet Take 1,000 mcg by mouth daily.     Yes [provider]     Vital Signs: BP 121/60   Pulse 75   Temp 97.6 F (36.4 C) (Oral)   Resp 20   Ht 5\' 10"  (1.778 m)   Wt 178 lb 2.1 oz (80.8 kg)   SpO2 90%   BMI 25.56 kg/m   Physical Exam RUQ drain intact, output 50 cc blood-tinged serous fluid; dressing dry, site NT; cx pend  Imaging: [place] Abscess Drain  Result Date: 07/11/2016 INDICATION: 76 year old male with several month history of melena he is, fever, night sweats and recent CT imaging highly concerning for hepatic abscess. EXAM: Ultrasound-guided drain placement into hepatic abscess MEDICATIONS: The patient is currently admitted to the hospital and receiving intravenous antibiotics. The antibiotics were administered within an appropriate time frame prior to the initiation of the procedure. ANESTHESIA/SEDATION: Fentanyl 50 mcg IV; Versed 1 mg IV Moderate Sedation Time:  20 minutes The patient was continuously monitored during the procedure by the interventional radiology nurse under my direct supervision. COMPLICATIONS: None immediate. PROCEDURE: Informed written consent was obtained from the patient after a thorough discussion of the procedural risks, benefits and alternatives. All questions were addressed. A timeout was performed prior to the initiation of the procedure. Right  upper quadrant was interrogated with ultrasound. A complex cystic mass is identified in the right hemi liver. The overlying skin was sterilely prepped and draped in standard fashion using chlorhexidine skin prep. Local anesthesia was attained by infiltration with 1% lidocaine. A small dermatotomy was made. Under real-time sonographic guidance, a 17 gauge introducer needle was advanced into the  fluid collection. Aspiration was performed yielding frankly purulent material. Therefore, a 0.035 guidewire was coiled within the complex collection. The needle was removed. The percutaneous transhepatic tract was dilated to 10 [place] and a Cook 10.2 [place] all-purpose drainage catheter was advanced over the wire and formed within the complex collection. Aspiration yields approximately 90 mL of thick, purulent fluid. The drainage catheter was gently flushed and connected to JP bulb suction and then secured to the skin with 0 Prolene suture and an adhesive fixation device. IMPRESSION: 1. Aspiration yields frankly purulent fluid. 2. Technically successful placement of a 10 French percutaneous drainage catheter into the hepatic abscess. Aspiration yields approximately 90 mL of thick, purulent fluid which was sent for culture. Signed, Criselda Peaches, MD [facility] Electronically Signed   By: Jacqulynn Cadet M.D.   On: 07/11/2016 17:55   Ct Abdomen Pelvis W Contrast  Result Date: 07/10/2016 CLINICAL DATA:  Nausea vomiting and decreased appetite EXAM: CT ABDOMEN AND PELVIS WITH CONTRAST TECHNIQUE: Multidetector CT imaging of the abdomen and pelvis was performed using the standard protocol following bolus administration of intravenous contrast. CONTRAST:  179mL ISOVUE-300 IOPAMIDOL (ISOVUE-300) INJECTION 61% COMPARISON:  None. FINDINGS: Lower chest: Lung bases demonstrate no acute consolidation or pleural effusion. Vascular calcification. No pericardial effusion. Hepatobiliary: Abnormal low-density at the porta hepatis which becomes less apparent on delayed views. Heterogenous low-density in the right hepatic lobe, measuring approximately 7.4 by 6.1 cm. No biliary dilatation. Surgical clips in the gallbladder fossa. Pancreas: Unremarkable. No pancreatic ductal dilatation or surrounding inflammatory changes. Spleen: Normal in size without focal  abnormality. Adrenals/Urinary Tract: 1 cm low-density left adrenal gland nodule. Kidneys demonstrate no hydronephrosis. Punctate stone mid pole left kidney. Probable cortical scarring in the lower pole of the left kidney. The bladder is unremarkable. Stomach/Bowel: Mild edema and soft tissue thickening at the gastroduodenal junction. No dilated small bowel. Colon diverticular disease. No acute wall thickening. Normal appendix Vascular/Lymphatic: Extensive atherosclerotic vascular calcification. Portal vein at the hilus and the splenic vein appear patent, however low density noted at the porta hepatis in the region of right and left portal vessels. Periportal edema is present. Infrarenal abdominal aortic aneurysm measuring 3.9 cm in maximum diameter. Reproductive: Enlarged prostate gland Other: Small free fluid in the pelvis.  No free air Musculoskeletal: Postsurgical changes at L4-L5. There are degenerative changes. No acute or suspicious bone lesion. IMPRESSION: 1. 7.4 cm heterogenous cystic mass in the right hepatic lobe, this is suspicious for liver abscess in the appropriate clinical setting. Cystic neoplasm also a consideration. 2. Low-density in the porta hepatis could represent altered perfusion ; unable to document patency of the right and left portal veins, consider correlation with Doppler to evaluate for patency of the intrahepatic portal vessels. 3. Small free fluid in the pelvis. Sigmoid colon diverticular disease without acute inflammation 4. Mild soft tissue stranding at the gastroduodenal junction, could relate to mild gastro duodenitis. 5. 3.9 cm infrarenal abdominal aortic aneurysm. Recommend followup by ultrasound in 2 years. This recommendation follows ACR consensus guidelines: White Paper of the ACR Incidental Findings Committee II on Vascular Findings. J Am Coll  Radiol 2013; 37:902-409. Electronically Signed   By: Donavan Foil M.D.   On: 07/10/2016 19:38   Dg Chest Port 1 View  Result  Date: 07/11/2016 CLINICAL DATA:  Tachypnea EXAM: PORTABLE CHEST 1 VIEW COMPARISON:  07/06/2016 CXR FINDINGS: Low lung volumes with subsegmental atelectasis in the left mid lung and both lung bases. No pneumonic consolidation, effusion or pulmonary edema. No pneumothorax. Heart is top-normal in size with aortic atherosclerosis. No acute nor suspicious osseous abnormalities. IMPRESSION: Low lung volumes with left mid and bibasilar atelectasis. Aortic atherosclerosis. Electronically Signed   By: Ashley Royalty M.D.   On: 07/11/2016 19:15   US Abdomen Limited Ruq  Result Date: 07/10/2016 CLINICAL DATA:  Abnormal CT.  Portal vein obstruction. EXAM: ULTRASOUND ABDOMEN LIMITED RIGHT UPPER QUADRANT COMPARISON:  Abdominal CT earlier this day. FINDINGS: Gallbladder: Surgically absent. Common bile duct: Diameter: 5.7 mm, normal. Liver: Heterogeneous 7.0 x 7.5 x 6.4 cm lesion in the right hepatic lobe corresponding to abnormality on CT. There is an internal cystic components. Hepatic echotexture is heterogeneous, particularly centrally at the porta hepatis. There is normal directional flow of the portal vein at the porta hepatis, however possible cut off with questionable filling defect and surrounding collaterals at the porta hepatis. The central and left hepatic veins are visualized and are patent. IMPRESSION: 1. Questionable filling defect in the portal vein at the porta hepatis with surrounding collaterals, extrahepatic portal vein demonstrates normal directional flow. 2. Heterogeneous 7 x 7.5 x 6.4 cm lesion in the right hepatic lobe with some internal cystic components. Recommend MRI characterization with contrast. Electronically Signed   By: Jeb Levering M.D.   On: 07/10/2016 22:14    Labs:  CBC:  Recent Labs  07/09/16 1520 07/10/16 1735 07/11/16 0517 07/12/16 0859  WBC 13.4* 10.0 11.5* 11.8*  HGB 11.7* 10.3* 10.1* 9.9*  HCT 35.7* 30.9* 30.6* 30.3*  PLT 115.0* 118* 95* 114*    COAGS:  Recent  Labs  07/06/16 2122 07/11/16 0517  INR 1.25 1.41    BMP:  Recent Labs  07/06/16 2122 07/10/16 1735 07/11/16 0517 07/12/16 0859  NA 137 140 141 142  K 3.3* 3.7 3.6 3.3*  CL 102 107 111 111  CO2 25 24 24 26   GLUCOSE 116* 120* 91 110*  BUN 12 35* 27* 29*  CALCIUM 8.5* 7.8* 7.4* 7.9*  CREATININE 0.89 0.95 0.79 0.89  GFRNONAA >60 >60 >60 >60  GFRAA >60 >60 >60 >60    LIVER FUNCTION TESTS:  Recent Labs  07/09/16 1520 07/10/16 1735 07/11/16 0517 07/12/16 0859  BILITOT 1.3* 1.9* 2.1* 1.8*  AST 77* 47* 49* 41  ALT 86* 56 52 45  ALKPHOS 247* 310* 286* 309*  PROT 5.5* 5.3* 5.0* 5.2*  ALBUMIN 2.9* 2.1* 1.8* 1.9*    Assessment and Plan: S/p drainage of hepatic abscess 6/6; AF; WBC 11.8(11.5), hgb 9.9(10.1), plts 114k, t bili 1.8(2.1), AP 309(256), creat nl, K 3.3, blood cx-strept; drain fl cx pend; cont with drain flushes; antbx per primary team- await sens; check ammonia; send drain fluid for cytology as well; check f/u CT within 1 week of drain placement or sooner if clinically worsens  Electronically Signed: D. Rowe Robert, PA-C 07/12/2016, 2:29 PM   I spent a total of 20 minutes at the the patient's bedside AND on the patient's hospital floor or unit, greater than 50% of which was counseling/coordinating care for hepatic abscess drain    Patient ID: Brandon [name], male   DOB: [birth year], 76  y.o.   MRN: [id number]

## 2016-07-12 NOTE — Progress Notes (Addendum)
Patient has become progressively confused over the last few hours. Patient has been reoriented multiple times but still remains confused about events that have taken place over hospitalization. He feels that Dr. is holding him against his will because of issues from his past marriage. Mr. Krantz also states that his wallet with $2000 cash is missing. Md paged and made aware of new onset of confusion. Will continue to monitor and reorient patient as needed. Bed alarm on and patient in camera monitored room.   1400- pt. Wife at bedside and updated on patient status. She informs patient that she has his wallet in her purse.

## 2016-07-12 NOTE — Progress Notes (Signed)
PROGRESS NOTE    Brandon [name]  ZOX:[id number] DOB: [birth year] DOA: 07/10/2016 PCP: Brunetta Jeans, PA-C    Brief Narrative:  76 y.o. male with medical history significant for coronary artery disease, GERD, osteoarthritis, and hypertension, now presenting to the emergency department for evaluation of subjective fevers, chills, loss of appetite with 15 pound weight loss, and nonspecific malaise. Patient reports that he had been in his usual state of health until one month ago when he developed a nonspecific malaise with subjective fevers and chills. He also began to develop a loss of appetite around that time. Since then, he has been evaluated by his PCP and in the emergency department, was treated with a course of antibiotics for possible tickborne illness though titers were negative, and has not experienced any improvement in his symptoms. He reports a 15 pound weight loss over this interval. He notes a long-standing history of occasional loose stools, but denies any significant diarrhea or constipation. He denies any significant abdominal pain, reports mild nausea, but denies vomiting. There has not been any significant cough or dyspnea and no chest pain or palpitations. He denies any recent long distance travel, GI tract instrumentation, or sick contacts. He denies melena or hematochezia. He had a colonoscopy in February with polyps with pathology report indicating no high-grade dysplasia or malignancy  Assessment & Plan:   Principal Problem:   Liver abscess Active Problems:   CAD, NATIVE VESSEL   GERD   Hypertension   Aneurysm of infrarenal abdominal aorta (HCC)   Normocytic anemia   Thrombocytopenia (HCC)   Malnutrition of moderate degree  1. Liver abscess with sepsis present on admission - Pt presents tachycardia, fevers with CT reviewed. Findings suggestive of liver abscess, however cystic neoplasm also considered - He is noted to have 15-lb wt-loss and developed a new anemia over  this interval  - Blood cultures from 07/06/16 have NGTD; blood cultures were collected again in ED, reviewed.  - Initially started on Rocephin and Flagyl - Pt became floridly septic and patient transitioned to meropenem and vanc - Pt underwent drain placement on 07/11/16 with purulence drained. - Sepsis resolved. - Blood cultures thus far pos for strep species. Fluid cx thus far pos for gm pos in pairs - Transitioned to IV rocephin with added flagyl - Check cmp in AM  2. Anemia, thrombocytopenia  - Hgb is 10.3 on admission, progressively worsening over past month, was wnl in early May '18  - Platelets 118k on admission - No bleeding is evident, no pallor; pt denies melena or hematochezia  - Colonoscopy in Feb '18 with 2 polyps, path report no high-grade dysplasia or malignancy  - remains stable thus far - Repeat CBC in AM  3. CAD - No anginal complaints  - Continue Lopressor and Imdur  - Hold benazepril given soft BP and dehydration with elevated BUN  - Hold Lipitor in light of elevated LFT's - Remains stable  4. Hypertension  - BP soft but stable in ED  - Continuing Norvasc as tolerated - ACEI was held initially secondary to concerns of dehydration - BP stable  5. GERD - EGD in Feb '18 with stricture at GE junction and hiatal hernia  - He is managed at home with daily PPI - Patient is continued on PPI as tolerated   6. AAA  - 3.9 cm infrarenal AAA noted incidentally on CT abd/pelvis  - Follow-up in 2 years with abd [place] advised  7. Mental status change, new problem -  Uncertain etiology. Consider ICU delerium, toxic-metabolic encephalopathy secondary to active infection - Have ordered ammonia level and reviewed. Borderline elevated - Continue ABX - Will transfer to medical floor - avoid sedating meds if possible  8. Tobacco abuse - Will provide nicotine patch  DVT prophylaxis: Lovenox subQ Code Status: Full Family Communication: Pt in room, family at  bedside Disposition Plan: Uncertain at this time  Consultants:   IR  Procedures:   Liver abscess drain placement 07/11/16  Antimicrobials: Anti-infectives    Start     Dose/Rate Route Frequency Ordered Stop   07/12/16 1400  cefTRIAXone (ROCEPHIN) 2 g in dextrose 5 % 50 mL IVPB     2 g 100 mL/hr over 30 Minutes Intravenous Every 24 hours 07/12/16 1316     07/12/16 1400  metroNIDAZOLE (FLAGYL) IVPB 500 mg     500 mg 100 mL/hr over 60 Minutes Intravenous Every 8 hours 07/12/16 1316     07/11/16 1600  vancomycin (VANCOCIN) IVPB 1000 mg/200 mL premix  Status:  Discontinued     1,000 mg 200 mL/hr over 60 Minutes Intravenous Every 12 hours 07/11/16 1522 07/12/16 1316   07/11/16 1530  meropenem (MERREM) 1 g in sodium chloride 0.9 % 100 mL IVPB  Status:  Discontinued     1 g 200 mL/hr over 30 Minutes Intravenous Every 8 hours 07/11/16 1522 07/12/16 1316   07/10/16 2100  cefTRIAXone (ROCEPHIN) 2 g in dextrose 5 % 50 mL IVPB  Status:  Discontinued     2 g 100 mL/hr over 30 Minutes Intravenous Every 24 hours 07/10/16 2045 07/11/16 1508   07/10/16 2100  metroNIDAZOLE (FLAGYL) IVPB 500 mg  Status:  Discontinued     500 mg 100 mL/hr over 60 Minutes Intravenous Every 8 hours 07/10/16 2045 07/11/16 1508      Subjective: Reports feeling much improved  Objective: Vitals:   07/12/16 0620 07/12/16 0800 07/12/16 1200 07/12/16 1400  BP:  129/76 121/60 131/67  Pulse:  73 75 80  Resp:  14 20 (!) 21  Temp:  98 F (36.7 C) 97.6 F (36.4 C)   TempSrc:  Oral Oral   SpO2:  96% 90% 94%  Weight: 80.8 kg (178 lb 2.1 oz)     Height:        Intake/Output Summary (Last 24 hours) at 07/12/16 1543 Last data filed at 07/12/16 1451  Gross per 24 hour  Intake          5408.75 ml  Output             1375 ml  Net          4033.75 ml   Filed Weights   07/10/16 1628 07/10/16 1819 07/12/16 0620  Weight: 76.2 kg (168 lb) 76.2 kg (168 lb) 80.8 kg (178 lb 2.1 oz)    Examination: General exam: Awake,  laying in bed, in nad Respiratory system: Normal respiratory effort, no wheezing Cardiovascular system: regular rate, s1, s2 Gastrointestinal system: Soft, nondistended, positive BS Central nervous system: CN2-12 grossly intact, strength intact Extremities: Perfused, no clubbing Skin: Normal skin turgor, no notable skin lesions seen Psychiatry: Mood normal // no visual hallucinations   Data Reviewed: I have personally reviewed following labs and imaging studies  CBC:  Recent Labs Lab 07/06/16 2122 07/09/16 1520 07/10/16 1735 07/11/16 0517 07/12/16 0859  WBC 13.0* 13.4* 10.0 11.5* 11.8*  NEUTROABS 11.0* 11.5* 8.5* 9.9*  --   HGB 12.3* 11.7* 10.3* 10.1* 9.9*  HCT 37.5* 35.7* 30.9*  30.6* 30.3*  MCV 84.1 83.0 82.0 82.9 83.9  PLT 165 115.0* 118* 95* 468*   Basic Metabolic Panel:  Recent Labs Lab 07/06/16 2122 07/10/16 1735 07/11/16 0517 07/12/16 0859  NA 137 140 141 142  K 3.3* 3.7 3.6 3.3*  CL 102 107 111 111  CO2 25 24 24 26   GLUCOSE 116* 120* 91 110*  BUN 12 35* 27* 29*  CREATININE 0.89 0.95 0.79 0.89  CALCIUM 8.5* 7.8* 7.4* 7.9*   GFR: Estimated Creatinine Clearance: 76.3 mL/min (by C-G formula based on SCr of 0.89 mg/dL). Liver Function Tests:  Recent Labs Lab 07/06/16 2122 07/09/16 1520 07/10/16 1735 07/11/16 0517 07/12/16 0859  AST 28 77* 47* 49* 41  ALT 28 86* 56 52 45  ALKPHOS 196* 247* 310* 286* 309*  BILITOT 1.2 1.3* 1.9* 2.1* 1.8*  PROT 7.1 5.5* 5.3* 5.0* 5.2*  ALBUMIN 3.1* 2.9* 2.1* 1.8* 1.9*   No results for input(s): LIPASE, AMYLASE in the last 168 hours.  Recent Labs Lab 07/12/16 1457  AMMONIA 39*   Coagulation Profile:  Recent Labs Lab 07/06/16 2122 07/11/16 0517  INR 1.25 1.41   Cardiac Enzymes: No results for input(s): CKTOTAL, CKMB, CKMBINDEX, TROPONINI in the last 168 hours. BNP (last 3 results) No results for input(s): PROBNP in the last 8760 hours. HbA1C: No results for input(s): HGBA1C in the last 72 hours. CBG: No  results for input(s): GLUCAP in the last 168 hours. Lipid Profile: No results for input(s): CHOL, HDL, LDLCALC, TRIG, CHOLHDL, LDLDIRECT in the last 72 hours. Thyroid Function Tests: No results for input(s): TSH, T4TOTAL, FREET4, T3FREE, THYROIDAB in the last 72 hours. Anemia Panel:  Recent Labs  07/10/16 2255  VITAMINB12 4,419*  FOLATE 7.4  FERRITIN 499*  TIBC 134*  IRON 6*  RETICCTPCT 0.5   Sepsis Labs:  Recent Labs Lab 07/06/16 2144 07/10/16 1746  LATICACIDVEN 1.23 1.31    Recent Results (from the past 240 hour(s))  Culture, blood (Routine x 2)     Status: None   Collection Time: 07/06/16  9:22 PM  Result Value Ref Range Status   Specimen Description BLOOD LEFT FOREARM  Final   Special Requests BOTTLES DRAWN AEROBIC AND ANAEROBIC BCAV  Final   Culture   Final    NO GROWTH 5 DAYS Performed at [facility], [place] [address] [place], [place] [address]    Report Status 07/12/2016 FINAL  Final  Culture, blood (Routine x 2)     Status: None   Collection Time: 07/06/16  9:27 PM  Result Value Ref Range Status   Specimen Description BLOOD RAC  Final   Special Requests BOTTLES DRAWN AEROBIC AND ANAEROBIC BCAV  Final   Culture   Final    NO GROWTH 5 DAYS Performed at [facility], [place] [address], [place], [place] [address]    Report Status 07/12/2016 FINAL  Final  Blood Culture (routine x 2)     Status: None (Preliminary result)   Collection Time: 07/10/16  5:35 PM  Result Value Ref Range Status   Specimen Description BLOOD LEFT ARM  Final   Special Requests   Final    BOTTLES DRAWN AEROBIC AND ANAEROBIC Blood Culture adequate volume   Culture  Setup Time   Final    GRAM POSITIVE COCCI IN CHAINS ANAEROBIC BOTTLE ONLY Organism ID to follow CRITICAL RESULT CALLED TO, READ BACK BY AND VERIFIED WITH: J. Scherrie November Pharm.D. 11:15 07/12/16 ([email or web address])    Culture  Final    NO GROWTH 2 DAYS Performed at [facility], [place] [address], [place], [place] [address]     Report Status PENDING  Incomplete  Blood Culture ID Panel (Reflexed)     Status: Abnormal   Collection Time: 07/10/16  5:35 PM  Result Value Ref Range Status   Enterococcus species NOT DETECTED NOT DETECTED Final   Listeria monocytogenes NOT DETECTED NOT DETECTED Final   Staphylococcus species NOT DETECTED NOT DETECTED Final   Staphylococcus aureus NOT DETECTED NOT DETECTED Final   Streptococcus species DETECTED (A) NOT DETECTED Final    Comment: Not Enterococcus species, Streptococcus agalactiae, Streptococcus pyogenes, or Streptococcus pneumoniae. CRITICAL RESULT CALLED TO, READ BACK BY AND VERIFIED WITH: J. Scherrie November Pharm.D. 11:15 07/12/16 ([email or web address])    Streptococcus agalactiae NOT DETECTED NOT DETECTED Final   Streptococcus pneumoniae NOT DETECTED NOT DETECTED Final   Streptococcus pyogenes NOT DETECTED NOT DETECTED Final   Acinetobacter baumannii NOT DETECTED NOT DETECTED Final   Enterobacteriaceae species NOT DETECTED NOT DETECTED Final   Enterobacter cloacae complex NOT DETECTED NOT DETECTED Final   Escherichia coli NOT DETECTED NOT DETECTED Final   Klebsiella oxytoca NOT DETECTED NOT DETECTED Final   Klebsiella pneumoniae NOT DETECTED NOT DETECTED Final   Proteus species NOT DETECTED NOT DETECTED Final   Serratia marcescens NOT DETECTED NOT DETECTED Final   Haemophilus influenzae NOT DETECTED NOT DETECTED Final   Neisseria meningitidis NOT DETECTED NOT DETECTED Final   Pseudomonas aeruginosa NOT DETECTED NOT DETECTED Final   Candida albicans NOT DETECTED NOT DETECTED Final   Candida glabrata NOT DETECTED NOT DETECTED Final   Candida krusei NOT DETECTED NOT DETECTED Final   Candida parapsilosis NOT DETECTED NOT DETECTED Final   Candida tropicalis NOT DETECTED NOT DETECTED Final    Comment: Performed at [facility], [place] [address] [place], [place] [address]  Blood Culture (routine x 2)     Status: None (Preliminary result)   Collection Time: 07/10/16  5:43 PM  Result  Value Ref Range Status   Specimen Description BLOOD RIGHT HAND  Final   Special Requests   Final    BOTTLES DRAWN AEROBIC AND ANAEROBIC Blood Culture adequate volume   Culture   Final    NO GROWTH 2 DAYS Performed at [facility], [place] [address], [place], [place] [address]    Report Status PENDING  Incomplete  Aerobic/Anaerobic Culture (surgical/deep wound)     Status: None (Preliminary result)   Collection Time: 07/11/16  5:22 PM  Result Value Ref Range Status   Specimen Description ABSCESS HEPATIC  Final   Special Requests NONE  Final   Gram Stain   Final    ABUNDANT WBC PRESENT, PREDOMINANTLY PMN ABUNDANT GRAM POSITIVE COCCI IN PAIRS    Culture   Final    NO GROWTH < 24 HOURS Performed at [facility], [place] [address] [place], [place] [address]    Report Status PENDING  Incomplete  MRSA PCR Screening     Status: None   Collection Time: 07/11/16  6:54 PM  Result Value Ref Range Status   MRSA by PCR NEGATIVE NEGATIVE Final    Comment:        The GeneXpert MRSA Assay (FDA approved for NASAL specimens only), is one component of a comprehensive MRSA colonization surveillance program. It is not intended to diagnose MRSA infection nor to guide or monitor treatment for MRSA infections.      Radiology Studies: [place] Abscess  Drain  Result Date: 07/11/2016 INDICATION: 76 year old male with several month history of melena he is, fever, night sweats and recent CT imaging highly concerning for hepatic abscess. EXAM: Ultrasound-guided drain placement into hepatic abscess MEDICATIONS: The patient is currently admitted to the hospital and receiving intravenous antibiotics. The antibiotics were administered within an appropriate time frame prior to the initiation of the procedure. ANESTHESIA/SEDATION: Fentanyl 50 mcg IV; Versed 1 mg IV Moderate Sedation Time:  20 minutes The patient was continuously monitored during the procedure by the interventional radiology nurse under my  direct supervision. COMPLICATIONS: None immediate. PROCEDURE: Informed written consent was obtained from the patient after a thorough discussion of the procedural risks, benefits and alternatives. All questions were addressed. A timeout was performed prior to the initiation of the procedure. Right upper quadrant was interrogated with ultrasound. A complex cystic mass is identified in the right hemi liver. The overlying skin was sterilely prepped and draped in standard fashion using chlorhexidine skin prep. Local anesthesia was attained by infiltration with 1% lidocaine. A small dermatotomy was made. Under real-time sonographic guidance, a 17 gauge introducer needle was advanced into the fluid collection. Aspiration was performed yielding frankly purulent material. Therefore, a 0.035 guidewire was coiled within the complex collection. The needle was removed. The percutaneous transhepatic tract was dilated to 10 [place] and a Cook 10.2 [place] all-purpose drainage catheter was advanced over the wire and formed within the complex collection. Aspiration yields approximately 90 mL of thick, purulent fluid. The drainage catheter was gently flushed and connected to JP bulb suction and then secured to the skin with 0 Prolene suture and an adhesive fixation device. IMPRESSION: 1. Aspiration yields frankly purulent fluid. 2. Technically successful placement of a 10 French percutaneous drainage catheter into the hepatic abscess. Aspiration yields approximately 90 mL of thick, purulent fluid which was sent for culture. Signed, Criselda Peaches, MD [facility] Radiology Electronically Signed   By: Jacqulynn Cadet M.D.   On: 07/11/2016 17:55   Ct Abdomen Pelvis W Contrast  Result Date: 07/10/2016 CLINICAL DATA:  Nausea vomiting and decreased appetite EXAM: CT ABDOMEN AND PELVIS WITH CONTRAST TECHNIQUE: Multidetector CT imaging of the abdomen and pelvis was performed using the  standard protocol following bolus administration of intravenous contrast. CONTRAST:  131mL ISOVUE-300 IOPAMIDOL (ISOVUE-300) INJECTION 61% COMPARISON:  None. FINDINGS: Lower chest: Lung bases demonstrate no acute consolidation or pleural effusion. Vascular calcification. No pericardial effusion. Hepatobiliary: Abnormal low-density at the porta hepatis which becomes less apparent on delayed views. Heterogenous low-density in the right hepatic lobe, measuring approximately 7.4 by 6.1 cm. No biliary dilatation. Surgical clips in the gallbladder fossa. Pancreas: Unremarkable. No pancreatic ductal dilatation or surrounding inflammatory changes. Spleen: Normal in size without focal abnormality. Adrenals/Urinary Tract: 1 cm low-density left adrenal gland nodule. Kidneys demonstrate no hydronephrosis. Punctate stone mid pole left kidney. Probable cortical scarring in the lower pole of the left kidney. The bladder is unremarkable. Stomach/Bowel: Mild edema and soft tissue thickening at the gastroduodenal junction. No dilated small bowel. Colon diverticular disease. No acute wall thickening. Normal appendix Vascular/Lymphatic: Extensive atherosclerotic vascular calcification. Portal vein at the hilus and the splenic vein appear patent, however low density noted at the porta hepatis in the region of right and left portal vessels. Periportal edema is present. Infrarenal abdominal aortic aneurysm measuring 3.9 cm in maximum diameter. Reproductive: Enlarged prostate gland Other: Small free fluid in the pelvis.  No free air Musculoskeletal: Postsurgical changes at L4-L5. There are  degenerative changes. No acute or suspicious bone lesion. IMPRESSION: 1. 7.4 cm heterogenous cystic mass in the right hepatic lobe, this is suspicious for liver abscess in the appropriate clinical setting. Cystic neoplasm also a consideration. 2. Low-density in the porta hepatis could represent altered perfusion ; unable to document patency of the right  and left portal veins, consider correlation with Doppler to evaluate for patency of the intrahepatic portal vessels. 3. Small free fluid in the pelvis. Sigmoid colon diverticular disease without acute inflammation 4. Mild soft tissue stranding at the gastroduodenal junction, could relate to mild gastro duodenitis. 5. 3.9 cm infrarenal abdominal aortic aneurysm. Recommend followup by ultrasound in 2 years. This recommendation follows ACR consensus guidelines: White Paper of the ACR Incidental Findings Committee II on Vascular Findings. J Am Coll Radiol 2013; 10:789-794. Electronically Signed   By: Donavan Foil M.D.   On: 07/10/2016 19:38   Dg Chest Port 1 View  Result Date: 07/11/2016 CLINICAL DATA:  Tachypnea EXAM: PORTABLE CHEST 1 VIEW COMPARISON:  07/06/2016 CXR FINDINGS: Low lung volumes with subsegmental atelectasis in the left mid lung and both lung bases. No pneumonic consolidation, effusion or pulmonary edema. No pneumothorax. Heart is top-normal in size with aortic atherosclerosis. No acute nor suspicious osseous abnormalities. IMPRESSION: Low lung volumes with left mid and bibasilar atelectasis. Aortic atherosclerosis. Electronically Signed   By: Ashley Royalty M.D.   On: 07/11/2016 19:15   US Abdomen Limited Ruq  Result Date: 07/10/2016 CLINICAL DATA:  Abnormal CT.  Portal vein obstruction. EXAM: ULTRASOUND ABDOMEN LIMITED RIGHT UPPER QUADRANT COMPARISON:  Abdominal CT earlier this day. FINDINGS: Gallbladder: Surgically absent. Common bile duct: Diameter: 5.7 mm, normal. Liver: Heterogeneous 7.0 x 7.5 x 6.4 cm lesion in the right hepatic lobe corresponding to abnormality on CT. There is an internal cystic components. Hepatic echotexture is heterogeneous, particularly centrally at the porta hepatis. There is normal directional flow of the portal vein at the porta hepatis, however possible cut off with questionable filling defect and surrounding collaterals at the porta hepatis. The central and left  hepatic veins are visualized and are patent. IMPRESSION: 1. Questionable filling defect in the portal vein at the porta hepatis with surrounding collaterals, extrahepatic portal vein demonstrates normal directional flow. 2. Heterogeneous 7 x 7.5 x 6.4 cm lesion in the right hepatic lobe with some internal cystic components. Recommend MRI characterization with contrast. Electronically Signed   By: Jeb Levering M.D.   On: 07/10/2016 22:14    Scheduled Meds: . amLODipine  5 mg Oral Daily  . cholecalciferol  1,000 Units Oral Daily  . enoxaparin (LOVENOX) injection  40 mg Subcutaneous Q24H  . isosorbide mononitrate  30 mg Oral QHS  . mouth rinse  15 mL Mouth Rinse BID  . metoprolol tartrate  50 mg Oral BID  . nicotine  14 mg Transdermal Daily  . pantoprazole  40 mg Oral Daily  . sodium chloride flush  3 mL Intravenous Q12H  . vitamin B-12  1,000 mcg Oral Daily   Continuous Infusions: . sodium chloride 1,000 mL (07/12/16 1400)  . cefTRIAXone (ROCEPHIN)  IV Stopped (07/12/16 1402)  . famotidine (PEPCID) IV Stopped (07/12/16 1054)  . metronidazole Stopped (07/12/16 1533)     LOS: 2 days   Fernado Brigante, Orpah Melter, MD [facility] Pager [phone]  If 7PM-7AM, please contact night-coverage [email or web address] Password [facility] 07/12/2016, 3:43 PM

## 2016-07-13 DIAGNOSIS — I81 Portal vein thrombosis: Secondary | ICD-10-CM

## 2016-07-13 LAB — CBC
HEMATOCRIT: 28.7 % — AB (ref 39.0–52.0)
Hemoglobin: 9.5 g/dL — ABNORMAL LOW (ref 13.0–17.0)
MCH: 27.4 pg (ref 26.0–34.0)
MCHC: 33.1 g/dL (ref 30.0–36.0)
MCV: 82.7 fL (ref 78.0–100.0)
PLATELETS: 127 10*3/uL — AB (ref 150–400)
RBC: 3.47 MIL/uL — AB (ref 4.22–5.81)
RDW: 15.3 % (ref 11.5–15.5)
WBC: 10.5 10*3/uL (ref 4.0–10.5)

## 2016-07-13 LAB — COMPREHENSIVE METABOLIC PANEL
ALT: 35 U/L (ref 17–63)
AST: 32 U/L (ref 15–41)
Albumin: 1.7 g/dL — ABNORMAL LOW (ref 3.5–5.0)
Alkaline Phosphatase: 404 U/L — ABNORMAL HIGH (ref 38–126)
Anion gap: 6 (ref 5–15)
BUN: 21 mg/dL — ABNORMAL HIGH (ref 6–20)
CHLORIDE: 111 mmol/L (ref 101–111)
CO2: 23 mmol/L (ref 22–32)
CREATININE: 0.75 mg/dL (ref 0.61–1.24)
Calcium: 7.5 mg/dL — ABNORMAL LOW (ref 8.9–10.3)
GFR calc non Af Amer: >60 mL/min (ref >60)
Glucose, Bld: 113 mg/dL — ABNORMAL HIGH (ref 65–99)
POTASSIUM: 3.5 mmol/L (ref 3.5–5.1)
SODIUM: 140 mmol/L (ref 135–145)
Total Bilirubin: 1.3 mg/dL — ABNORMAL HIGH (ref 0.3–1.2)
Total Protein: 4.6 g/dL — ABNORMAL LOW (ref 6.5–8.1)

## 2016-07-13 MED ORDER — GUAIFENESIN ER 600 MG PO TB12
600.0000 mg | ORAL_TABLET | Freq: Two times a day (BID) | ORAL | Status: DC
  Administered 2016-07-13 – 2016-07-16 (×8): 600 mg via ORAL
  Filled 2016-07-13 (×8): qty 1

## 2016-07-13 NOTE — Progress Notes (Signed)
Referring Physician(s): Chiu,S  Supervising Physician: Markus Daft  Patient Status:  [facility] - In-pt  Chief Complaint:  Hepatic abscess  Subjective:  Pt doing fair; not as confused today; denies worsening  abd pain,N/V; has had few loose stools; has noticed some sweats; temp up to 101.2- tylenol given  Allergies: Celecoxib  Medications: Prior to Admission medications   Medication Sig Start Date End Date Taking? Authorizing Provider  acetaminophen (TYLENOL) 325 MG tablet Take 325 mg by mouth every 6 (six) hours as needed for fever.   Yes [provider]  amLODipine (NORVASC) 5 MG tablet TAKE 1 TABLET(5 MG) BY MOUTH DAILY 06/25/16  Yes Larey Dresser, MD  aspirin 81 MG EC tablet Take 81 mg by mouth daily.     Yes [provider]  atorvastatin (LIPITOR) 40 MG tablet Take 40 mg by mouth daily.   Yes [provider]  benazepril (LOTENSIN) 20 MG tablet TAKE 1 TABLET BY MOUTH DAILY 05/14/16  Yes Brunetta Jeans, PA-C  cholecalciferol (VITAMIN D) 1000 UNITS tablet Take 1,000 Units by mouth daily.    Yes [provider]  diphenhydrAMINE (BENADRYL) 25 MG tablet Take 50 mg by mouth every 6 (six) hours as needed.   Yes [provider]  fluticasone (FLONASE) 50 MCG/ACT nasal spray Place 2 sprays into both nostrils daily as needed. Patient taking differently: Place 2 sprays into both nostrils daily as needed for allergies.  12/07/14  Yes Brunetta Jeans, PA-C  HYDROcodone-acetaminophen (NORCO/VICODIN) 5-325 MG tablet Take 0.5-1 tablets by mouth every 6 (six) hours as needed for moderate pain. 05/22/16  Yes Brunetta Jeans, PA-C  isosorbide mononitrate (IMDUR) 30 MG 24 hr tablet TAKE 1 TABLET BY MOUTH AT BEDTIME 04/12/16  Yes Brunetta Jeans, PA-C  metoprolol (LOPRESSOR) 50 MG tablet TAKE 1 TABLET BY MOUTH TWICE DAILY 04/13/16  Yes Brunetta Jeans, PA-C  Omega-3 Fatty Acids (FISH OIL) 1000 MG CAPS Take 2 capsules by mouth 2 (two) times daily.     Yes  [provider]  omeprazole (PRILOSEC) 20 MG capsule TAKE 1 CAPSULE(20 MG) BY MOUTH DAILY 07/03/16  Yes Ladene Artist, MD  vitamin B-12 (CYANOCOBALAMIN) 1000 MCG tablet Take 1,000 mcg by mouth daily.     Yes [provider]     Vital Signs: BP 138/70   Pulse 84   Temp 99.2 F (37.3 C) (Oral)   Resp (!) 22   Ht 5\' 10"  (1.778 m)   Wt 178 lb 2.1 oz (80.8 kg)   SpO2 96%   BMI 25.56 kg/m   Physical Exam RUQ drain intact, output 20 cc serous fluid with tissue fragments; site mildly tender; cx pend  Imaging: [place] Abscess Drain  Result Date: 07/11/2016 INDICATION: 76 year old male with several month history of melena he is, fever, night sweats and recent CT imaging highly concerning for hepatic abscess. EXAM: Ultrasound-guided drain placement into hepatic abscess MEDICATIONS: The patient is currently admitted to the hospital and receiving intravenous antibiotics. The antibiotics were administered within an appropriate time frame prior to the initiation of the procedure. ANESTHESIA/SEDATION: Fentanyl 50 mcg IV; Versed 1 mg IV Moderate Sedation Time:  20 minutes The patient was continuously monitored during the procedure by the interventional radiology nurse under my direct supervision. COMPLICATIONS: None immediate. PROCEDURE: Informed written consent was obtained from the patient after a thorough discussion of the procedural risks, benefits and alternatives. All questions were addressed. A timeout was performed prior to the initiation of  the procedure. Right upper quadrant was interrogated with ultrasound. A complex cystic mass is identified in the right hemi liver. The overlying skin was sterilely prepped and draped in standard fashion using chlorhexidine skin prep. Local anesthesia was attained by infiltration with 1% lidocaine. A small dermatotomy was made. Under real-time sonographic guidance, a 17 gauge introducer needle was advanced into the fluid collection. Aspiration was  performed yielding frankly purulent material. Therefore, a 0.035 guidewire was coiled within the complex collection. The needle was removed. The percutaneous transhepatic tract was dilated to 10 [place] and a Cook 10.2 [place] all-purpose drainage catheter was advanced over the wire and formed within the complex collection. Aspiration yields approximately 90 mL of thick, purulent fluid. The drainage catheter was gently flushed and connected to JP bulb suction and then secured to the skin with 0 Prolene suture and an adhesive fixation device. IMPRESSION: 1. Aspiration yields frankly purulent fluid. 2. Technically successful placement of a 10 French percutaneous drainage catheter into the hepatic abscess. Aspiration yields approximately 90 mL of thick, purulent fluid which was sent for culture. Signed, Criselda Peaches, MD [facility] Radiology Electronically Signed   By: Jacqulynn Cadet M.D.   On: 07/11/2016 17:55   Ct Abdomen Pelvis W Contrast  Result Date: 07/10/2016 CLINICAL DATA:  Nausea vomiting and decreased appetite EXAM: CT ABDOMEN AND PELVIS WITH CONTRAST TECHNIQUE: Multidetector CT imaging of the abdomen and pelvis was performed using the standard protocol following bolus administration of intravenous contrast. CONTRAST:  16mL ISOVUE-300 IOPAMIDOL (ISOVUE-300) INJECTION 61% COMPARISON:  None. FINDINGS: Lower chest: Lung bases demonstrate no acute consolidation or pleural effusion. Vascular calcification. No pericardial effusion. Hepatobiliary: Abnormal low-density at the porta hepatis which becomes less apparent on delayed views. Heterogenous low-density in the right hepatic lobe, measuring approximately 7.4 by 6.1 cm. No biliary dilatation. Surgical clips in the gallbladder fossa. Pancreas: Unremarkable. No pancreatic ductal dilatation or surrounding inflammatory changes. Spleen: Normal in size without focal abnormality. Adrenals/Urinary Tract: 1 cm  low-density left adrenal gland nodule. Kidneys demonstrate no hydronephrosis. Punctate stone mid pole left kidney. Probable cortical scarring in the lower pole of the left kidney. The bladder is unremarkable. Stomach/Bowel: Mild edema and soft tissue thickening at the gastroduodenal junction. No dilated small bowel. Colon diverticular disease. No acute wall thickening. Normal appendix Vascular/Lymphatic: Extensive atherosclerotic vascular calcification. Portal vein at the hilus and the splenic vein appear patent, however low density noted at the porta hepatis in the region of right and left portal vessels. Periportal edema is present. Infrarenal abdominal aortic aneurysm measuring 3.9 cm in maximum diameter. Reproductive: Enlarged prostate gland Other: Small free fluid in the pelvis.  No free air Musculoskeletal: Postsurgical changes at L4-L5. There are degenerative changes. No acute or suspicious bone lesion. IMPRESSION: 1. 7.4 cm heterogenous cystic mass in the right hepatic lobe, this is suspicious for liver abscess in the appropriate clinical setting. Cystic neoplasm also a consideration. 2. Low-density in the porta hepatis could represent altered perfusion ; unable to document patency of the right and left portal veins, consider correlation with Doppler to evaluate for patency of the intrahepatic portal vessels. 3. Small free fluid in the pelvis. Sigmoid colon diverticular disease without acute inflammation 4. Mild soft tissue stranding at the gastroduodenal junction, could relate to mild gastro duodenitis. 5. 3.9 cm infrarenal abdominal aortic aneurysm. Recommend followup by ultrasound in 2 years. This recommendation follows ACR consensus guidelines: White Paper of the ACR Incidental Findings Committee II on Vascular Findings.  J Am Coll Radiol 2013; 10:789-794. Electronically Signed   By: Donavan Foil M.D.   On: 07/10/2016 19:38   Dg Chest Port 1 View  Result Date: 07/11/2016 CLINICAL DATA:  Tachypnea  EXAM: PORTABLE CHEST 1 VIEW COMPARISON:  07/06/2016 CXR FINDINGS: Low lung volumes with subsegmental atelectasis in the left mid lung and both lung bases. No pneumonic consolidation, effusion or pulmonary edema. No pneumothorax. Heart is top-normal in size with aortic atherosclerosis. No acute nor suspicious osseous abnormalities. IMPRESSION: Low lung volumes with left mid and bibasilar atelectasis. Aortic atherosclerosis. Electronically Signed   By: Ashley Royalty M.D.   On: 07/11/2016 19:15   US Abdomen Limited Ruq  Result Date: 07/10/2016 CLINICAL DATA:  Abnormal CT.  Portal vein obstruction. EXAM: ULTRASOUND ABDOMEN LIMITED RIGHT UPPER QUADRANT COMPARISON:  Abdominal CT earlier this day. FINDINGS: Gallbladder: Surgically absent. Common bile duct: Diameter: 5.7 mm, normal. Liver: Heterogeneous 7.0 x 7.5 x 6.4 cm lesion in the right hepatic lobe corresponding to abnormality on CT. There is an internal cystic components. Hepatic echotexture is heterogeneous, particularly centrally at the porta hepatis. There is normal directional flow of the portal vein at the porta hepatis, however possible cut off with questionable filling defect and surrounding collaterals at the porta hepatis. The central and left hepatic veins are visualized and are patent. IMPRESSION: 1. Questionable filling defect in the portal vein at the porta hepatis with surrounding collaterals, extrahepatic portal vein demonstrates normal directional flow. 2. Heterogeneous 7 x 7.5 x 6.4 cm lesion in the right hepatic lobe with some internal cystic components. Recommend MRI characterization with contrast. Electronically Signed   By: Jeb Levering M.D.   On: 07/10/2016 22:14    Labs:  CBC:  Recent Labs  07/10/16 1735 07/11/16 0517 07/12/16 0859 07/13/16 0320  WBC 10.0 11.5* 11.8* 10.5  HGB 10.3* 10.1* 9.9* 9.5*  HCT 30.9* 30.6* 30.3* 28.7*  PLT 118* 95* 114* 127*    COAGS:  Recent Labs  07/06/16 2122 07/11/16 0517  INR 1.25  1.41    BMP:  Recent Labs  07/10/16 1735 07/11/16 0517 07/12/16 0859 07/13/16 0320  NA 140 141 142 140  K 3.7 3.6 3.3* 3.5  CL 107 111 111 111  CO2 24 24 26 23   GLUCOSE 120* 91 110* 113*  BUN 35* 27* 29* 21*  CALCIUM 7.8* 7.4* 7.9* 7.5*  CREATININE 0.95 0.79 0.89 0.75  GFRNONAA >60 >60 >60 >60  GFRAA >60 >60 >60 >60    LIVER FUNCTION TESTS:  Recent Labs  07/10/16 1735 07/11/16 0517 07/12/16 0859 07/13/16 0320  BILITOT 1.9* 2.1* 1.8* 1.3*  AST 47* 49* 41 32  ALT 56 52 45 35  ALKPHOS 310* 286* 309* 404*  PROT 5.3* 5.0* 5.2* 4.6*  ALBUMIN 2.1* 1.8* 1.9* 1.7*    Assessment and Plan: S/p drainage of hepatic abscess 6/6; AF; WBC nl; hgbstable; creat nl; t bili 1.3(1.8); drain cx pend; cont current tx;ammonia yesterday sl elevated at 39; monitor for antbx sensitivites; check f/u CT within 1 week of drain placement or sooner if clinically worsens   Electronically Signed: D. Rowe Robert, PA-C 07/13/2016, 12:50 PM   I spent a total of 15 minutes at the the patient's bedside AND on the patient's hospital floor or unit, greater than 50% of which was counseling/coordinating care for hepatic abscess drain    Patient ID: NEITHAN DAY, male   DOB: [birth year], 76 y.o.   MRN: [id number]

## 2016-07-13 NOTE — Progress Notes (Addendum)
PROGRESS NOTE    Brandon [name]  EAV:[id number] DOB: Dec 17, 1940 DOA: 07/10/2016 PCP: Brunetta Jeans, PA-C    Brief Narrative:  76 y.o. male with medical history significant for coronary artery disease, GERD, osteoarthritis, and hypertension, now presenting to the emergency department for evaluation of subjective fevers, chills, loss of appetite with 15 pound weight loss, and nonspecific malaise. Patient reports that he had been in his usual state of health until one month ago when he developed a nonspecific malaise with subjective fevers and chills. He also began to develop a loss of appetite around that time. Since then, he has been evaluated by his PCP and in the emergency department, was treated with a course of antibiotics for possible tickborne illness though titers were negative, and has not experienced any improvement in his symptoms. He reports a 15 pound weight loss over this interval. He notes a long-standing history of occasional loose stools, but denies any significant diarrhea or constipation. He denies any significant abdominal pain, reports mild nausea, but denies vomiting. There has not been any significant cough or dyspnea and no chest pain or palpitations. He denies any recent long distance travel, GI tract instrumentation, or sick contacts. He denies melena or hematochezia. He had a colonoscopy in February with polyps with pathology report indicating no high-grade dysplasia or malignancy  Assessment & Plan:   Principal Problem:   Liver abscess Active Problems:   CAD, NATIVE VESSEL   GERD   Hypertension   Aneurysm of infrarenal abdominal aorta (HCC)   Normocytic anemia   Thrombocytopenia (HCC)   Malnutrition of moderate degree  1. Liver abscess with sepsis present on admission - Pt presents tachycardia, fevers with CT reviewed. Findings suggestive of liver abscess, however cystic neoplasm also considered - He is noted to have 15-lb wt-loss and developed a new anemia over  this interval  - Blood cultures from 07/06/16 have NGTD; blood cultures were collected again in ED, reviewed.  - Initially started on Rocephin and Flagyl - Pt became floridly septic and patient transitioned to meropenem and vanc - Pt underwent drain placement on 07/11/16 with purulence drained. - Sepsis resolved. - Blood cultures thus far pos for strep species. Fluid cx thus far pos for gm pos in pairs. Await final culture results - Transitioned to IV rocephin with added flagyl - check cmp in AM  2. Anemia, thrombocytopenia  - Hgb is 10.3 on admission, progressively worsening over past month, was wnl in early May '18  - Platelets 118k on admission - No bleeding is evident, no pallor; pt denies melena or hematochezia  - Colonoscopy in Feb '18 with 2 polyps, path report no high-grade dysplasia or malignancy  - plts improving - Will repeat CBC in AM  3. CAD - Continue Lopressor and Imdur  - Hold benazepril given soft BP and dehydration with elevated BUN  - Hold Lipitor in light of elevated LFT's - Remains stable and denies chest pain  4. Hypertension  - BP soft but stable in ED  - Continuing Norvasc as tolerated - ACEI was held initially secondary to concerns of dehydration - BP remains stable  5. 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 AAA  - 3.9 cm infrarenal AAA noted incidentally on CT abd/pelvis  - Recommend repeat US in 2 years  7. Mental status change, new problem - Uncertain  etiology. Consider ICU delerium, toxic-metabolic encephalopathy secondary to active infection - Have ordered ammonia level and reviewed. Borderline elevated - Continue ABX - continue to avoid sedating meds if possible  8. Tobacco abuse - Nicotine patch ordered - Stable  9. 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 Imaging was reviewed by Dr. Silverio Decamp. Per GI,  recommendations to NOT anticoagulate. - GI recommends repeat CT scan in several weeks for interval change  DVT prophylaxis: Lovenox subQ Code Status: Full Family Communication: Pt in room, family not at bedside Disposition Plan: Uncertain at this time  Consultants:   IR  Procedures:   Liver abscess drain placement 07/11/16  Antimicrobials: Anti-infectives    Start     Dose/Rate Route Frequency Ordered Stop   07/12/16 1400  cefTRIAXone (ROCEPHIN) 2 g in dextrose 5 % 50 mL IVPB     2 g 100 mL/hr over 30 Minutes Intravenous Every 24 hours 07/12/16 1316     07/12/16 1400  metroNIDAZOLE (FLAGYL) IVPB 500 mg     500 mg 100 mL/hr over 60 Minutes Intravenous Every 8 hours 07/12/16 1316     07/11/16 1600  vancomycin (VANCOCIN) IVPB 1000 mg/200 mL premix  Status:  Discontinued     1,000 mg 200 mL/hr over 60 Minutes Intravenous Every 12 hours 07/11/16 1522 07/12/16 1316   07/11/16 1530  meropenem (MERREM) 1 g in sodium chloride 0.9 % 100 mL IVPB  Status:  Discontinued     1 g 200 mL/hr over 30 Minutes Intravenous Every 8 hours 07/11/16 1522 07/12/16 1316   07/10/16 2100  cefTRIAXone (ROCEPHIN) 2 g in dextrose 5 % 50 mL IVPB  Status:  Discontinued     2 g 100 mL/hr over 30 Minutes Intravenous Every 24 hours 07/10/16 2045 07/11/16 1508   07/10/16 2100  metroNIDAZOLE (FLAGYL) IVPB 500 mg  Status:  Discontinued     500 mg 100 mL/hr over 60 Minutes Intravenous Every 8 hours 07/10/16 2045 07/11/16 1508      Subjective: States feeling better today  Objective: Vitals:   07/13/16 1100 07/13/16 1145 07/13/16 1255 07/13/16 1338  BP:    123/67  Pulse: 84   79  Resp:    (!) 22  Temp:  99.2 F (37.3 C) (!) 101.7 F (38.7 C) 99.1 F (37.3 C)  TempSrc:  Oral Oral Oral  SpO2: 96%   96%  Weight:      Height:        Intake/Output Summary (Last 24 hours) at 07/13/16 1512 Last data filed at 07/13/16 1338  Gross per 24 hour  Intake           789.25 ml  Output              735 ml  Net             54.25 ml   Filed Weights   07/10/16 1628 07/10/16 1819 07/12/16 0620  Weight: 76.2 kg (168 lb) 76.2 kg (168 lb) 80.8 kg (178 lb 2.1 oz)    Examination: General exam: Conversant, in no acute distress Respiratory system: normal chest rise, clear, no audible wheezing Cardiovascular system: regular rhythm, s1-s2 Gastrointestinal system: Nondistended, nontender, pos BS Central nervous system: No seizures, no tremors Extremities: No cyanosis, no joint deformities Skin: No rashes, no pallor Psychiatry: Affect normal // no auditory hallucinations   Data Reviewed: I have personally reviewed following labs and imaging studies  CBC:  Recent Labs Lab 07/06/16 2122 07/09/16 1520  07/10/16 1735 07/11/16 0517 07/12/16 0859 07/13/16 0320  WBC 13.0* 13.4* 10.0 11.5* 11.8* 10.5  NEUTROABS 11.0* 11.5* 8.5* 9.9*  --   --   HGB 12.3* 11.7* 10.3* 10.1* 9.9* 9.5*  HCT 37.5* 35.7* 30.9* 30.6* 30.3* 28.7*  MCV 84.1 83.0 82.0 82.9 83.9 82.7  PLT 165 115.0* 118* 95* 114* 646*   Basic Metabolic Panel:  Recent Labs Lab 07/06/16 2122 07/10/16 1735 07/11/16 0517 07/12/16 0859 07/13/16 0320  NA 137 140 141 142 140  K 3.3* 3.7 3.6 3.3* 3.5  CL 102 107 111 111 111  CO2 25 24 24 26 23   GLUCOSE 116* 120* 91 110* 113*  BUN 12 35* 27* 29* 21*  CREATININE 0.89 0.95 0.79 0.89 0.75  CALCIUM 8.5* 7.8* 7.4* 7.9* 7.5*   GFR: Estimated Creatinine Clearance: 84.9 mL/min (by C-G formula based on SCr of 0.75 mg/dL). Liver Function Tests:  Recent Labs Lab 07/09/16 1520 07/10/16 1735 07/11/16 0517 07/12/16 0859 07/13/16 0320  AST 77* 47* 49* 41 32  ALT 86* 56 52 45 35  ALKPHOS 247* 310* 286* 309* 404*  BILITOT 1.3* 1.9* 2.1* 1.8* 1.3*  PROT 5.5* 5.3* 5.0* 5.2* 4.6*  ALBUMIN 2.9* 2.1* 1.8* 1.9* 1.7*   No results for input(s): LIPASE, AMYLASE in the last 168 hours.  Recent Labs Lab 07/12/16 1457  AMMONIA 39*   Coagulation Profile:  Recent Labs Lab 07/06/16 2122 07/11/16 0517    INR 1.25 1.41   Cardiac Enzymes: No results for input(s): CKTOTAL, CKMB, CKMBINDEX, TROPONINI in the last 168 hours. BNP (last 3 results) No results for input(s): PROBNP in the last 8760 hours. HbA1C: No results for input(s): HGBA1C in the last 72 hours. CBG: No results for input(s): GLUCAP in the last 168 hours. Lipid Profile: No results for input(s): CHOL, HDL, LDLCALC, TRIG, CHOLHDL, LDLDIRECT in the last 72 hours. Thyroid Function Tests: No results for input(s): TSH, T4TOTAL, FREET4, T3FREE, THYROIDAB in the last 72 hours. Anemia Panel:  Recent Labs  07/10/16 2255  VITAMINB12 4,419*  FOLATE 7.4  FERRITIN 499*  TIBC 134*  IRON 6*  RETICCTPCT 0.5   Sepsis Labs:  Recent Labs Lab 07/06/16 2144 07/10/16 1746  LATICACIDVEN 1.23 1.31    Recent Results (from the past 240 hour(s))  Culture, blood (Routine x 2)     Status: None   Collection Time: 07/06/16  9:22 PM  Result Value Ref Range Status   Specimen Description BLOOD LEFT FOREARM  Final   Special Requests BOTTLES DRAWN AEROBIC AND ANAEROBIC BCAV  Final   Culture   Final    NO GROWTH 5 DAYS Performed at [facility], [place] [address], [place], [place] [address]    Report Status 07/12/2016 FINAL  Final  Culture, blood (Routine x 2)     Status: None   Collection Time: 07/06/16  9:27 PM  Result Value Ref Range Status   Specimen Description BLOOD RAC  Final   Special Requests BOTTLES DRAWN AEROBIC AND ANAEROBIC BCAV  Final   Culture   Final    NO GROWTH 5 DAYS Performed at [facility], [place] [address] [place],  [address]    Report Status 07/12/2016 FINAL  Final  Blood Culture (routine x 2)     Status: None (Preliminary result)   Collection Time: 07/10/16  5:35 PM  Result Value Ref Range Status   Specimen Description BLOOD LEFT ARM  Final   Special Requests   Final  BOTTLES DRAWN AEROBIC AND ANAEROBIC Blood Culture adequate volume   Culture  Setup Time   Final    GRAM POSITIVE COCCI IN  CHAINS ANAEROBIC BOTTLE ONLY Organism ID to follow CRITICAL RESULT CALLED TO, READ BACK BY AND VERIFIED WITH: J. Scherrie November Pharm.D. 11:15 07/12/16 ([email or web address])    Culture   Final    GRAM POSITIVE COCCI CULTURE REINCUBATED FOR BETTER GROWTH Performed at [facility], [place] [address] [place], [place] [address]    Report Status PENDING  Incomplete  Blood Culture ID Panel (Reflexed)     Status: Abnormal   Collection Time: 07/10/16  5:35 PM  Result Value Ref Range Status   Enterococcus species NOT DETECTED NOT DETECTED Final   Listeria monocytogenes NOT DETECTED NOT DETECTED Final   Staphylococcus species NOT DETECTED NOT DETECTED Final   Staphylococcus aureus NOT DETECTED NOT DETECTED Final   Streptococcus species DETECTED (A) NOT DETECTED Final    Comment: Not Enterococcus species, Streptococcus agalactiae, Streptococcus pyogenes, or Streptococcus pneumoniae. CRITICAL RESULT CALLED TO, READ BACK BY AND VERIFIED WITH: J. Scherrie November Pharm.D. 11:15 07/12/16 ([email or web address])    Streptococcus agalactiae NOT DETECTED NOT DETECTED Final   Streptococcus pneumoniae NOT DETECTED NOT DETECTED Final   Streptococcus pyogenes NOT DETECTED NOT DETECTED Final   Acinetobacter baumannii NOT DETECTED NOT DETECTED Final   Enterobacteriaceae species NOT DETECTED NOT DETECTED Final   Enterobacter cloacae complex NOT DETECTED NOT DETECTED Final   Escherichia coli NOT DETECTED NOT DETECTED Final   Klebsiella oxytoca NOT DETECTED NOT DETECTED Final   Klebsiella pneumoniae NOT DETECTED NOT DETECTED Final   Proteus species NOT DETECTED NOT DETECTED Final   Serratia marcescens NOT DETECTED NOT DETECTED Final   Haemophilus influenzae NOT DETECTED NOT DETECTED Final   Neisseria meningitidis NOT DETECTED NOT DETECTED Final   Pseudomonas aeruginosa NOT DETECTED NOT DETECTED Final   Candida albicans NOT DETECTED NOT DETECTED Final   Candida glabrata NOT DETECTED NOT DETECTED Final   Candida krusei NOT DETECTED NOT DETECTED  Final   Candida parapsilosis NOT DETECTED NOT DETECTED Final   Candida tropicalis NOT DETECTED NOT DETECTED Final    Comment: Performed at [facility], [place] [address] [place], [place] [address]  Blood Culture (routine x 2)     Status: None (Preliminary result)   Collection Time: 07/10/16  5:43 PM  Result Value Ref Range Status   Specimen Description BLOOD RIGHT HAND  Final   Special Requests   Final    BOTTLES DRAWN AEROBIC AND ANAEROBIC Blood Culture adequate volume   Culture  Setup Time   Final    GRAM POSITIVE COCCI IN CHAINS ANAEROBIC BOTTLE ONLY CRITICAL VALUE NOTED.  VALUE IS CONSISTENT WITH PREVIOUSLY REPORTED AND CALLED VALUE.    Culture   Final    GRAM POSITIVE COCCI CULTURE REINCUBATED FOR BETTER GROWTH Performed at [facility], [place] [address], [place], [place] [address]    Report Status PENDING  Incomplete  Aerobic/Anaerobic Culture (surgical/deep wound)     Status: None (Preliminary result)   Collection Time: 07/11/16  5:22 PM  Result Value Ref Range Status   Specimen Description ABSCESS HEPATIC  Final   Special Requests NONE  Final   Gram Stain   Final    ABUNDANT WBC PRESENT, PREDOMINANTLY PMN ABUNDANT GRAM POSITIVE COCCI IN PAIRS    Culture   Final    CULTURE REINCUBATED FOR BETTER GROWTH Performed at [facility], [place] [address], [place], [place] [address]  Report Status PENDING  Incomplete  MRSA PCR Screening     Status: None   Collection Time: 07/11/16  6:54 PM  Result Value Ref Range Status   MRSA by PCR NEGATIVE NEGATIVE Final    Comment:        The GeneXpert MRSA Assay (FDA approved for NASAL specimens only), is one component of a comprehensive MRSA colonization surveillance program. It is not intended to diagnose MRSA infection nor to guide or monitor treatment for MRSA infections.      Radiology Studies: [place] Abscess Drain  Result Date: 07/11/2016 INDICATION: 76 year old male with several month history of melena he is,  fever, night sweats and recent CT imaging highly concerning for hepatic abscess. EXAM: Ultrasound-guided drain placement into hepatic abscess MEDICATIONS: The patient is currently admitted to the hospital and receiving intravenous antibiotics. The antibiotics were administered within an appropriate time frame prior to the initiation of the procedure. ANESTHESIA/SEDATION: Fentanyl 50 mcg IV; Versed 1 mg IV Moderate Sedation Time:  20 minutes The patient was continuously monitored during the procedure by the interventional radiology nurse under my direct supervision. COMPLICATIONS: None immediate. PROCEDURE: Informed written consent was obtained from the patient after a thorough discussion of the procedural risks, benefits and alternatives. All questions were addressed. A timeout was performed prior to the initiation of the procedure. Right upper quadrant was interrogated with ultrasound. A complex cystic mass is identified in the right hemi liver. The overlying skin was sterilely prepped and draped in standard fashion using chlorhexidine skin prep. Local anesthesia was attained by infiltration with 1% lidocaine. A small dermatotomy was made. Under real-time sonographic guidance, a 17 gauge introducer needle was advanced into the fluid collection. Aspiration was performed yielding frankly purulent material. Therefore, a 0.035 guidewire was coiled within the complex collection. The needle was removed. The percutaneous transhepatic tract was dilated to 10 [place] and a Cook 10.2 [place] all-purpose drainage catheter was advanced over the wire and formed within the complex collection. Aspiration yields approximately 90 mL of thick, purulent fluid. The drainage catheter was gently flushed and connected to JP bulb suction and then secured to the skin with 0 Prolene suture and an adhesive fixation device. IMPRESSION: 1. Aspiration yields frankly purulent fluid. 2. Technically successful placement of a 10 French percutaneous  drainage catheter into the hepatic abscess. Aspiration yields approximately 90 mL of thick, purulent fluid which was sent for culture. Signed, Criselda Peaches, MD [facility] Radiology Electronically Signed   By: Jacqulynn Cadet M.D.   On: 07/11/2016 17:55   Dg Chest Port 1 View  Result Date: 07/11/2016 CLINICAL DATA:  Tachypnea EXAM: PORTABLE CHEST 1 VIEW COMPARISON:  07/06/2016 CXR FINDINGS: Low lung volumes with subsegmental atelectasis in the left mid lung and both lung bases. No pneumonic consolidation, effusion or pulmonary edema. No pneumothorax. Heart is top-normal in size with aortic atherosclerosis. No acute nor suspicious osseous abnormalities. IMPRESSION: Low lung volumes with left mid and bibasilar atelectasis. Aortic atherosclerosis. Electronically Signed   By: Ashley Royalty M.D.   On: 07/11/2016 19:15    Scheduled Meds: . amLODipine  5 mg Oral Daily  . cholecalciferol  1,000 Units Oral Daily  . enoxaparin (LOVENOX) injection  40 mg Subcutaneous Q24H  . guaiFENesin  600 mg Oral BID  . isosorbide mononitrate  30 mg Oral QHS  . metoprolol tartrate  50 mg Oral BID  . nicotine  14 mg Transdermal Daily  . pantoprazole  40 mg Oral Daily  .  sodium chloride flush  3 mL Intravenous Q12H  . vitamin B-12  1,000 mcg Oral Daily   Continuous Infusions: . cefTRIAXone (ROCEPHIN)  IV Stopped (07/12/16 1402)  . famotidine (PEPCID) IV Stopped (07/13/16 1025)  . metronidazole Stopped (07/13/16 0758)     LOS: 3 days   Leeasia Secrist, Orpah Melter, MD [facility] Pager [phone]  If 7PM-7AM, please contact night-coverage [email or web address] Password [email or web address] 07/13/2016, 3:12 PM

## 2016-07-14 LAB — COMPREHENSIVE METABOLIC PANEL
ALT: 26 U/L (ref 17–63)
ANION GAP: 8 (ref 5–15)
AST: 23 U/L (ref 15–41)
Albumin: 1.7 g/dL — ABNORMAL LOW (ref 3.5–5.0)
Alkaline Phosphatase: 435 U/L — ABNORMAL HIGH (ref 38–126)
BILIRUBIN TOTAL: 1.5 mg/dL — AB (ref 0.3–1.2)
BUN: 16 mg/dL (ref 6–20)
CO2: 24 mmol/L (ref 22–32)
Calcium: 7.6 mg/dL — ABNORMAL LOW (ref 8.9–10.3)
Chloride: 108 mmol/L (ref 101–111)
Creatinine, Ser: 0.58 mg/dL — ABNORMAL LOW (ref 0.61–1.24)
GFR calc Af Amer: >60 mL/min (ref >60)
Glucose, Bld: 96 mg/dL (ref 65–99)
POTASSIUM: 3.5 mmol/L (ref 3.5–5.1)
Sodium: 140 mmol/L (ref 135–145)
TOTAL PROTEIN: 4.7 g/dL — AB (ref 6.5–8.1)

## 2016-07-14 MED ORDER — FAMOTIDINE 20 MG PO TABS
20.0000 mg | ORAL_TABLET | Freq: Two times a day (BID) | ORAL | Status: DC
  Administered 2016-07-14 – 2016-07-16 (×5): 20 mg via ORAL
  Filled 2016-07-14 (×5): qty 1

## 2016-07-14 NOTE — Progress Notes (Signed)
PHARMACIST - PHYSICIAN COMMUNICATION  DR:   Wyline Copas  CONCERNING: IV to Oral Route Change Policy  RECOMMENDATION: This patient is receiving famotidine by the intravenous route.  Based on criteria approved by the Pharmacy and Therapeutics Committee, the intravenous medication(s) is/are being converted to the equivalent oral dose form(s).   DESCRIPTION: These criteria include:  The patient is eating (either orally or via tube) and/or has been taking other orally administered medications for a least 24 hours  The patient has no evidence of active gastrointestinal bleeding or impaired GI absorption (gastrectomy, short bowel, patient on TNA or NPO).  If you have questions about this conversion, please contact the Pharmacy Department  []   [phone] )  [facility] []   [phone] )  [facility] []   [phone] )  [facility] []   [phone] )  [facility] [x]   [phone] )  [place], [place], [facility] 07/14/2016 8:54 AM

## 2016-07-14 NOTE — Progress Notes (Signed)
PHARMACY CONSULT NOTE FOR:  OUTPATIENT  PARENTERAL ANTIBIOTIC THERAPY (OPAT)  Indication: liver abscess Regimen: ceftriaxone 2gm IV q24h End date: July 31, 2016  IV antibiotic discharge orders are pended. To discharging provider:  please sign these orders via discharge navigator,  Select New Orders & click on the button choice - Manage This Unsigned Work.     Thank you for allowing pharmacy to be a part of this patient's care.  Dolly Rias RPh 07/14/2016, 2:51 PM Pager [phone]

## 2016-07-14 NOTE — Progress Notes (Signed)
PROGRESS NOTE    Brandon [name]  DPO:[id number] DOB: Oct 22, 1940 DOA: 07/10/2016 PCP: Brunetta Jeans, PA-C    Brief Narrative:  76 y.o. male with medical history significant for coronary artery disease, GERD, osteoarthritis, and hypertension, now presenting to the emergency department for evaluation of subjective fevers, chills, loss of appetite with 15 pound weight loss, and nonspecific malaise. Patient reports that he had been in his usual state of health until one month ago when he developed a nonspecific malaise with subjective fevers and chills. He also began to develop a loss of appetite around that time. Since then, he has been evaluated by his PCP and in the emergency department, was treated with a course of antibiotics for possible tickborne illness though titers were negative, and has not experienced any improvement in his symptoms. He reports a 15 pound weight loss over this interval. He notes a long-standing history of occasional loose stools, but denies any significant diarrhea or constipation. He denies any significant abdominal pain, reports mild nausea, but denies vomiting. There has not been any significant cough or dyspnea and no chest pain or palpitations. He denies any recent long distance travel, GI tract instrumentation, or sick contacts. He denies melena or hematochezia. He had a colonoscopy in February with polyps with pathology report indicating no high-grade dysplasia or malignancy  Assessment & Plan:   Principal Problem:   Liver abscess Active Problems:   CAD, NATIVE VESSEL   GERD   Hypertension   Aneurysm of infrarenal abdominal aorta (HCC)   Normocytic anemia   Thrombocytopenia (HCC)   Malnutrition of moderate degree  1. Liver abscess and strep intermedius bacteremia with sepsis present on admission - Pt presents tachycardia, fevers with CT reviewed. Findings suggestive of liver abscess, however cystic neoplasm also considered - He is noted to have 15-lb wt-loss  and developed a new anemia over this interval  - Blood cultures from 07/06/16 have NGTD; blood cultures were collected again in ED, reviewed.  - Initially started on Rocephin and Flagyl - Pt became floridly septic and patient transitioned to meropenem and vanc - Pt underwent drain placement on 07/11/16 with purulence drained. - Sepsis resolved. - Blood cultures thus far pos for strep species. Fluid cx thus far pos for gm pos in pairs. - Blood cx are pos for strep intermedius 2/2 - Pt is continued on IV rocephin with added flagyl - discussed case with ID who recommends total 3 weeks of rocephin - Arranged OPAT for home abx. Will consult CM to assist with arranging home abx  2. Anemia, thrombocytopenia  - Hgb is 10.3 on admission, progressively worsening over past month, was wnl in early May '18  - Platelets 118k on admission, down to 95, now improving - No bleeding is evident, no pallor; pt denies melena or hematochezia  - Colonoscopy in Feb '18 with 2 polyps, path report no high-grade dysplasia or malignancy  - will recheck CBC in AM  3. CAD - Continue Lopressor and Imdur  - Hold benazepril given soft BP and dehydration with elevated BUN  - Hold Lipitor in light of elevated LFT's - Currently stable. Without chest pain  4. Hypertension  - BP soft but stable in ED  - Continuing Norvasc as tolerated - ACEI was held secondary to concerns of dehydration - BP currently stable  5. 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 AAA  -  3.9 cm infrarenal AAA noted incidentally on CT abd/pelvis  - Patient is recommended to have repeat US in 2 years  7. Mental status change, new problem - Uncertain etiology. Consider ICU delerium, toxic-metabolic encephalopathy secondary to active infection - Have ordered ammonia level and reviewed. Borderline elevated - Much improved - Continue abx per above  8. Tobacco abuse -  Nicotine patch ordered - Remains stable  9. 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 Imaging was reviewed by Dr. Silverio Decamp. GI recommendations to NOT anticoagulate. - GI recommends repeat CT scan in several weeks for interval change  DVT prophylaxis: Lovenox subQ Code Status: Full Family Communication: Pt in room, family not at bedside Disposition Plan: Uncertain at this time  Consultants:   IR  Discussed case with GI  Discussed case with ID  Procedures:   Liver abscess drain placement 07/11/16  Antimicrobials: Anti-infectives    Start     Dose/Rate Route Frequency Ordered Stop   07/12/16 1400  cefTRIAXone (ROCEPHIN) 2 g in dextrose 5 % 50 mL IVPB     2 g 100 mL/hr over 30 Minutes Intravenous Every 24 hours 07/12/16 1316     07/12/16 1400  metroNIDAZOLE (FLAGYL) IVPB 500 mg  Status:  Discontinued     500 mg 100 mL/hr over 60 Minutes Intravenous Every 8 hours 07/12/16 1316 07/14/16 1445   07/11/16 1600  vancomycin (VANCOCIN) IVPB 1000 mg/200 mL premix  Status:  Discontinued     1,000 mg 200 mL/hr over 60 Minutes Intravenous Every 12 hours 07/11/16 1522 07/12/16 1316   07/11/16 1530  meropenem (MERREM) 1 g in sodium chloride 0.9 % 100 mL IVPB  Status:  Discontinued     1 g 200 mL/hr over 30 Minutes Intravenous Every 8 hours 07/11/16 1522 07/12/16 1316   07/10/16 2100  cefTRIAXone (ROCEPHIN) 2 g in dextrose 5 % 50 mL IVPB  Status:  Discontinued     2 g 100 mL/hr over 30 Minutes Intravenous Every 24 hours 07/10/16 2045 07/11/16 1508   07/10/16 2100  metroNIDAZOLE (FLAGYL) IVPB 500 mg  Status:  Discontinued     500 mg 100 mL/hr over 60 Minutes Intravenous Every 8 hours 07/10/16 2045 07/11/16 1508      Subjective: Reports feeling better  Objective: Vitals:   07/13/16 1338 07/13/16 2211 07/14/16 0153 07/14/16 0455  BP: 123/67 137/68  132/76  Pulse: 79 87  81  Resp: (!) 22 18  19   Temp: 99.1 F (37.3 C) 99.2 F (37.3 C) 99.6 F (37.6 C)  97.7 F (36.5 C)  TempSrc: Oral Oral Oral Oral  SpO2: 96% 96%  94%  Weight:      Height:        Intake/Output Summary (Last 24 hours) at 07/14/16 1510 Last data filed at 07/14/16 0658  Gross per 24 hour  Intake             1130 ml  Output              212 ml  Net              918 ml   Filed Weights   07/10/16 1628 07/10/16 1819 07/12/16 0620  Weight: 76.2 kg (168 lb) 76.2 kg (168 lb) 80.8 kg (178 lb 2.1 oz)    Examination: General exam: Awake, laying in bed, in nad Respiratory system: Normal respiratory effort, no wheezing Cardiovascular system: regular rate, s1, s2 Gastrointestinal system: Soft, nondistended, positive BS Central  nervous system: CN2-12 grossly intact, strength intact Extremities: Perfused, no clubbing Skin: Normal skin turgor, no notable skin lesions seen Psychiatry: Mood normal // no visual hallucinations   Data Reviewed: I have personally reviewed following labs and imaging studies  CBC:  Recent Labs Lab 07/09/16 1520 07/10/16 1735 07/11/16 0517 07/12/16 0859 07/13/16 0320  WBC 13.4* 10.0 11.5* 11.8* 10.5  NEUTROABS 11.5* 8.5* 9.9*  --   --   HGB 11.7* 10.3* 10.1* 9.9* 9.5*  HCT 35.7* 30.9* 30.6* 30.3* 28.7*  MCV 83.0 82.0 82.9 83.9 82.7  PLT 115.0* 118* 95* 114* 825*   Basic Metabolic Panel:  Recent Labs Lab 07/10/16 1735 07/11/16 0517 07/12/16 0859 07/13/16 0320 07/14/16 0802  NA 140 141 142 140 140  K 3.7 3.6 3.3* 3.5 3.5  CL 107 111 111 111 108  CO2 24 24 26 23 24   GLUCOSE 120* 91 110* 113* 96  BUN 35* 27* 29* 21* 16  CREATININE 0.95 0.79 0.89 0.75 0.58*  CALCIUM 7.8* 7.4* 7.9* 7.5* 7.6*   GFR: Estimated Creatinine Clearance: 84.9 mL/min (A) (by C-G formula based on SCr of 0.58 mg/dL (L)). Liver Function Tests:  Recent Labs Lab 07/10/16 1735 07/11/16 0517 07/12/16 0859 07/13/16 0320 07/14/16 0802  AST 47* 49* 41 32 23  ALT 56 52 45 35 26  ALKPHOS 310* 286* 309* 404* 435*  BILITOT 1.9* 2.1* 1.8* 1.3* 1.5*  PROT  5.3* 5.0* 5.2* 4.6* 4.7*  ALBUMIN 2.1* 1.8* 1.9* 1.7* 1.7*   No results for input(s): LIPASE, AMYLASE in the last 168 hours.  Recent Labs Lab 07/12/16 1457  AMMONIA 39*   Coagulation Profile:  Recent Labs Lab 07/11/16 0517  INR 1.41   Cardiac Enzymes: No results for input(s): CKTOTAL, CKMB, CKMBINDEX, TROPONINI in the last 168 hours. BNP (last 3 results) No results for input(s): PROBNP in the last 8760 hours. HbA1C: No results for input(s): HGBA1C in the last 72 hours. CBG: No results for input(s): GLUCAP in the last 168 hours. Lipid Profile: No results for input(s): CHOL, HDL, LDLCALC, TRIG, CHOLHDL, LDLDIRECT in the last 72 hours. Thyroid Function Tests: No results for input(s): TSH, T4TOTAL, FREET4, T3FREE, THYROIDAB in the last 72 hours. Anemia Panel: No results for input(s): VITAMINB12, FOLATE, FERRITIN, TIBC, IRON, RETICCTPCT in the last 72 hours. Sepsis Labs:  Recent Labs Lab 07/10/16 1746  LATICACIDVEN 1.31    Recent Results (from the past 240 hour(s))  Culture, blood (Routine x 2)     Status: None   Collection Time: 07/06/16  9:22 PM  Result Value Ref Range Status   Specimen Description BLOOD LEFT FOREARM  Final   Special Requests BOTTLES DRAWN AEROBIC AND ANAEROBIC BCAV  Final   Culture   Final    NO GROWTH 5 DAYS Performed at [facility], [place] [address], [place], [place] [address]    Report Status 07/12/2016 FINAL  Final  Culture, blood (Routine x 2)     Status: None   Collection Time: 07/06/16  9:27 PM  Result Value Ref Range Status   Specimen Description BLOOD RAC  Final   Special Requests BOTTLES DRAWN AEROBIC AND ANAEROBIC BCAV  Final   Culture   Final    NO GROWTH 5 DAYS Performed at [facility], [place] [address], [place], [place] [address]    Report Status 07/12/2016 FINAL  Final  Blood Culture (routine x 2)     Status: Abnormal (Preliminary result)   Collection Time: 07/10/16  5:35  PM  Result Value Ref Range Status    Specimen Description BLOOD LEFT ARM  Final   Special Requests   Final    BOTTLES DRAWN AEROBIC AND ANAEROBIC Blood Culture adequate volume   Culture  Setup Time   Final    GRAM POSITIVE COCCI IN CHAINS ANAEROBIC BOTTLE ONLY Organism ID to follow CRITICAL RESULT CALLED TO, READ BACK BY AND VERIFIED WITH: J. Scherrie November Pharm.D. 11:15 07/12/16 ([email or web address])    Culture (A)  Final    STREPTOCOCCUS INTERMEDIUS SUSCEPTIBILITIES TO FOLLOW Performed at [facility], [place] [address] [place], [place] [address]    Report Status PENDING  Incomplete  Blood Culture ID Panel (Reflexed)     Status: Abnormal   Collection Time: 07/10/16  5:35 PM  Result Value Ref Range Status   Enterococcus species NOT DETECTED NOT DETECTED Final   Listeria monocytogenes NOT DETECTED NOT DETECTED Final   Staphylococcus species NOT DETECTED NOT DETECTED Final   Staphylococcus aureus NOT DETECTED NOT DETECTED Final   Streptococcus species DETECTED (A) NOT DETECTED Final    Comment: Not Enterococcus species, Streptococcus agalactiae, Streptococcus pyogenes, or Streptococcus pneumoniae. CRITICAL RESULT CALLED TO, READ BACK BY AND VERIFIED WITH: J. Scherrie November Pharm.D. 11:15 07/12/16 ([email or web address])    Streptococcus agalactiae NOT DETECTED NOT DETECTED Final   Streptococcus pneumoniae NOT DETECTED NOT DETECTED Final   Streptococcus pyogenes NOT DETECTED NOT DETECTED Final   Acinetobacter baumannii NOT DETECTED NOT DETECTED Final   Enterobacteriaceae species NOT DETECTED NOT DETECTED Final   Enterobacter cloacae complex NOT DETECTED NOT DETECTED Final   Escherichia coli NOT DETECTED NOT DETECTED Final   Klebsiella oxytoca NOT DETECTED NOT DETECTED Final   Klebsiella pneumoniae NOT DETECTED NOT DETECTED Final   Proteus species NOT DETECTED NOT DETECTED Final   Serratia marcescens NOT DETECTED NOT DETECTED Final   Haemophilus influenzae NOT DETECTED NOT DETECTED Final   Neisseria meningitidis NOT DETECTED NOT DETECTED Final    Pseudomonas aeruginosa NOT DETECTED NOT DETECTED Final   Candida albicans NOT DETECTED NOT DETECTED Final   Candida glabrata NOT DETECTED NOT DETECTED Final   Candida krusei NOT DETECTED NOT DETECTED Final   Candida parapsilosis NOT DETECTED NOT DETECTED Final   Candida tropicalis NOT DETECTED NOT DETECTED Final    Comment: Performed at [facility], [place] [address], [place], [place] [address]  Blood Culture (routine x 2)     Status: Abnormal (Preliminary result)   Collection Time: 07/10/16  5:43 PM  Result Value Ref Range Status   Specimen Description BLOOD RIGHT HAND  Final   Special Requests   Final    BOTTLES DRAWN AEROBIC AND ANAEROBIC Blood Culture adequate volume   Culture  Setup Time   Final    GRAM POSITIVE COCCI IN CHAINS ANAEROBIC BOTTLE ONLY CRITICAL VALUE NOTED.  VALUE IS CONSISTENT WITH PREVIOUSLY REPORTED AND CALLED VALUE.    Culture (A)  Final    STREPTOCOCCUS INTERMEDIUS SUSCEPTIBILITIES TO FOLLOW Performed at [facility], [place] [address], [place], [place] [address]    Report Status PENDING  Incomplete  Aerobic/Anaerobic Culture (surgical/deep wound)     Status: None (Preliminary result)   Collection Time: 07/11/16  5:22 PM  Result Value Ref Range Status   Specimen Description ABSCESS HEPATIC  Final   Special Requests NONE  Final   Gram Stain   Final    ABUNDANT WBC PRESENT, PREDOMINANTLY PMN ABUNDANT GRAM POSITIVE COCCI IN PAIRS    Culture   Final  ABUNDANT GRAM POSITIVE COCCI CULTURE REINCUBATED FOR BETTER GROWTH Performed at [facility], [place] [address], [place], [place] [address]    Report Status PENDING  Incomplete  MRSA PCR Screening     Status: None   Collection Time: 07/11/16  6:54 PM  Result Value Ref Range Status   MRSA by PCR NEGATIVE NEGATIVE Final    Comment:        The GeneXpert MRSA Assay (FDA approved for NASAL specimens only), is one component of a comprehensive MRSA colonization surveillance program. It is  not intended to diagnose MRSA infection nor to guide or monitor treatment for MRSA infections.      Radiology Studies: No results found.  Scheduled Meds: . amLODipine  5 mg Oral Daily  . cholecalciferol  1,000 Units Oral Daily  . enoxaparin (LOVENOX) injection  40 mg Subcutaneous Q24H  . famotidine  20 mg Oral BID  . guaiFENesin  600 mg Oral BID  . isosorbide mononitrate  30 mg Oral QHS  . metoprolol tartrate  50 mg Oral BID  . nicotine  14 mg Transdermal Daily  . pantoprazole  40 mg Oral Daily  . sodium chloride flush  3 mL Intravenous Q12H  . vitamin B-12  1,000 mcg Oral Daily   Continuous Infusions: . cefTRIAXone (ROCEPHIN)  IV 2 g (07/14/16 1440)     LOS: 4 days   Christopher Glasscock, Orpah Melter, MD [facility] Pager [phone]  If 7PM-7AM, please contact night-coverage [email or web address] Password [email or web address] 07/14/2016, 3:10 PM

## 2016-07-15 DIAGNOSIS — E440 Moderate protein-calorie malnutrition: Secondary | ICD-10-CM

## 2016-07-15 DIAGNOSIS — D649 Anemia, unspecified: Secondary | ICD-10-CM

## 2016-07-15 HISTORY — PX: PICC LINE INSERTION: CATH118290

## 2016-07-15 LAB — CULTURE, BLOOD (ROUTINE X 2)
SPECIAL REQUESTS: ADEQUATE
Special Requests: ADEQUATE

## 2016-07-15 LAB — COMPREHENSIVE METABOLIC PANEL
ALT: 20 U/L (ref 17–63)
AST: 18 U/L (ref 15–41)
Albumin: 1.7 g/dL — ABNORMAL LOW (ref 3.5–5.0)
Alkaline Phosphatase: 427 U/L — ABNORMAL HIGH (ref 38–126)
Anion gap: 9 (ref 5–15)
BILIRUBIN TOTAL: 1.2 mg/dL (ref 0.3–1.2)
BUN: 16 mg/dL (ref 6–20)
CALCIUM: 7.7 mg/dL — AB (ref 8.9–10.3)
CO2: 26 mmol/L (ref 22–32)
CREATININE: 0.65 mg/dL (ref 0.61–1.24)
Chloride: 105 mmol/L (ref 101–111)
GFR calc Af Amer: >60 mL/min (ref >60)
Glucose, Bld: 101 mg/dL — ABNORMAL HIGH (ref 65–99)
Potassium: 3.6 mmol/L (ref 3.5–5.1)
Sodium: 140 mmol/L (ref 135–145)
TOTAL PROTEIN: 4.8 g/dL — AB (ref 6.5–8.1)

## 2016-07-15 MED ORDER — SODIUM CHLORIDE 0.9% FLUSH
10.0000 mL | INTRAVENOUS | Status: DC | PRN
  Administered 2016-07-16: 10 mL
  Filled 2016-07-15: qty 40

## 2016-07-15 NOTE — Progress Notes (Signed)
PROGRESS NOTE    Brandon [name]  ENI:[id number] DOB: 01-26-1941 DOA: 07/10/2016 PCP: Brunetta Jeans, PA-C    Brief Narrative:  76 y.o. male with medical history significant for coronary artery disease, GERD, osteoarthritis, and hypertension, now presenting to the emergency department for evaluation of subjective fevers, chills, loss of appetite with 15 pound weight loss, and nonspecific malaise. Patient reports that he had been in his usual state of health until one month ago when he developed a nonspecific malaise with subjective fevers and chills. He also began to develop a loss of appetite around that time. Since then, he has been evaluated by his PCP and in the emergency department, was treated with a course of antibiotics for possible tickborne illness though titers were negative, and has not experienced any improvement in his symptoms. He reports a 15 pound weight loss over this interval. He notes a long-standing history of occasional loose stools, but denies any significant diarrhea or constipation. He denies any significant abdominal pain, reports mild nausea, but denies vomiting. There has not been any significant cough or dyspnea and no chest pain or palpitations. He denies any recent long distance travel, GI tract instrumentation, or sick contacts. He denies melena or hematochezia. He had a colonoscopy in February with polyps with pathology report indicating no high-grade dysplasia or malignancy  Assessment & Plan:   Principal Problem:   Liver abscess Active Problems:   CAD, NATIVE VESSEL   GERD   Hypertension   Aneurysm of infrarenal abdominal aorta (HCC)   Normocytic anemia   Thrombocytopenia (HCC)   Malnutrition of moderate degree  1. Liver abscess and strep intermedius bacteremia with sepsis present on admission - Pt presents tachycardia, fevers with CT reviewed. Findings suggestive of liver abscess, however cystic neoplasm also considered - He is noted to have 15-lb wt-loss  and developed a new anemia over this interval  - Blood cultures from 07/06/16 have NGTD; blood cultures were collected again in ED, reviewed.  - Initially started on Rocephin and Flagyl - Pt became floridly septic and patient transitioned to meropenem and vanc - Pt underwent drain placement on 07/11/16 with purulence drained. - Sepsis resolved. - Blood cultures thus far pos for strep species. Fluid cx thus far pos for gm pos in pairs. - Blood cx are pos for strep intermedius 2/2 - Patient tolerating Rocephin - discussed case with ID who recommends total 3 weeks of rocephin - Arranged OPAT for home abx. -PICC line placed 07/15/2016. Case management consulted to assist with home health needs, pending  2. Anemia, thrombocytopenia  - Hgb is 10.3 on admission, progressively worsening over past month, was wnl in early May '18  - Platelets 118k on admission, down to 95, now improving - No bleeding is evident, no pallor; pt denies melena or hematochezia  - Colonoscopy in Feb '18 with 2 polyps, path report no high-grade dysplasia or malignancy  - Labs reviewed. Platelets improving  3. CAD - Continue Lopressor and Imdur  - Hold benazepril given soft BP and dehydration with elevated BUN  - Hold Lipitor in light of elevated LFT's - Remains medically stable. Continues to deny any chest pain  4. Hypertension  - BP soft but stable in ED  - Continuing Norvasc as tolerated - ACEI was held secondary to concerns of dehydration -Blood pressure stable. Continue to monitor  5. GERD - EGD in Feb '18 with stricture at GE junction and hiatal hernia  - Pt is continued on PPI - Remains stable  at this time  6. AAA  - 3.9 cm infrarenal AAA noted incidentally on CT abd/pelvis  - Patient is recommended to have repeat US in 2 years -Currently stable  7. Mental status change, new problem - Uncertain etiology. Consider ICU delerium, toxic-metabolic encephalopathy secondary to active infection - Have  ordered ammonia level and reviewed. Borderline elevated - Now baseline  8. Tobacco abuse - Nicotine patch ordered - Stable at present  9. 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 Imaging was reviewed by Dr. Silverio Decamp. GI recommendations to NOT anticoagulate. - GI recommends repeat CT scan in several weeks for interval change -Overall stable  DVT prophylaxis: Lovenox subQ Code Status: Full Family Communication: Pt in room, family not at bedside Disposition Plan: Uncertain at this time  Consultants:   IR  Discussed case with GI  Discussed case with ID  Procedures:   Liver abscess drain placement 07/11/16  PICC line placed 07/15/2016  Antimicrobials: Anti-infectives    Start     Dose/Rate Route Frequency Ordered Stop   07/12/16 1400  cefTRIAXone (ROCEPHIN) 2 g in dextrose 5 % 50 mL IVPB     2 g 100 mL/hr over 30 Minutes Intravenous Every 24 hours 07/12/16 1316     07/12/16 1400  metroNIDAZOLE (FLAGYL) IVPB 500 mg  Status:  Discontinued     500 mg 100 mL/hr over 60 Minutes Intravenous Every 8 hours 07/12/16 1316 07/14/16 1445   07/11/16 1600  vancomycin (VANCOCIN) IVPB 1000 mg/200 mL premix  Status:  Discontinued     1,000 mg 200 mL/hr over 60 Minutes Intravenous Every 12 hours 07/11/16 1522 07/12/16 1316   07/11/16 1530  meropenem (MERREM) 1 g in sodium chloride 0.9 % 100 mL IVPB  Status:  Discontinued     1 g 200 mL/hr over 30 Minutes Intravenous Every 8 hours 07/11/16 1522 07/12/16 1316   07/10/16 2100  cefTRIAXone (ROCEPHIN) 2 g in dextrose 5 % 50 mL IVPB  Status:  Discontinued     2 g 100 mL/hr over 30 Minutes Intravenous Every 24 hours 07/10/16 2045 07/11/16 1508   07/10/16 2100  metroNIDAZOLE (FLAGYL) IVPB 500 mg  Status:  Discontinued     500 mg 100 mL/hr over 60 Minutes Intravenous Every 8 hours 07/10/16 2045 07/11/16 1508      Subjective: No complaints at present. Questioning about going home  Objective: Vitals:   07/14/16  1545 07/14/16 2024 07/15/16 0449 07/15/16 1352  BP: 129/68 112/77 136/70 130/74  Pulse: 81 95 80 73  Resp: 18 17 18 20   Temp: 99.5 F (37.5 C) 99.1 F (37.3 C) 98.8 F (37.1 C) 97.4 F (36.3 C)  TempSrc:  Oral Oral Oral  SpO2: 98% 96% 98% 98%  Weight:      Height:        Intake/Output Summary (Last 24 hours) at 07/15/16 1530 Last data filed at 07/15/16 1300  Gross per 24 hour  Intake              485 ml  Output                0 ml  Net              485 ml   Filed Weights   07/10/16 1628 07/10/16 1819 07/12/16 0620  Weight: 76.2 kg (168 lb) 76.2 kg (168 lb) 80.8 kg (178 lb 2.1 oz)    Examination: General exam: Conversant, in no acute distress Respiratory  system: normal chest rise, clear, no audible wheezing Cardiovascular system: regular rhythm, s1-s2 Gastrointestinal system: Nondistended, nontender, pos BS Central nervous system: No seizures, no tremors Extremities: No cyanosis, no joint deformities Skin: No rashes, no pallor Psychiatry: Affect normal // no auditory hallucinations   Data Reviewed: I have personally reviewed following labs and imaging studies  CBC:  Recent Labs Lab 07/09/16 1520 07/10/16 1735 07/11/16 0517 07/12/16 0859 07/13/16 0320  WBC 13.4* 10.0 11.5* 11.8* 10.5  NEUTROABS 11.5* 8.5* 9.9*  --   --   HGB 11.7* 10.3* 10.1* 9.9* 9.5*  HCT 35.7* 30.9* 30.6* 30.3* 28.7*  MCV 83.0 82.0 82.9 83.9 82.7  PLT 115.0* 118* 95* 114* 263*   Basic Metabolic Panel:  Recent Labs Lab 07/11/16 0517 07/12/16 0859 07/13/16 0320 07/14/16 0802 07/15/16 0615  NA 141 142 140 140 140  K 3.6 3.3* 3.5 3.5 3.6  CL 111 111 111 108 105  CO2 24 26 23 24 26   GLUCOSE 91 110* 113* 96 101*  BUN 27* 29* 21* 16 16  CREATININE 0.79 0.89 0.75 0.58* 0.65  CALCIUM 7.4* 7.9* 7.5* 7.6* 7.7*   GFR: Estimated Creatinine Clearance: 84.9 mL/min (by C-G formula based on SCr of 0.65 mg/dL). Liver Function Tests:  Recent Labs Lab 07/11/16 0517 07/12/16 0859  07/13/16 0320 07/14/16 0802 07/15/16 0615  AST 49* 41 32 23 18  ALT 52 45 35 26 20  ALKPHOS 286* 309* 404* 435* 427*  BILITOT 2.1* 1.8* 1.3* 1.5* 1.2  PROT 5.0* 5.2* 4.6* 4.7* 4.8*  ALBUMIN 1.8* 1.9* 1.7* 1.7* 1.7*   No results for input(s): LIPASE, AMYLASE in the last 168 hours.  Recent Labs Lab 07/12/16 1457  AMMONIA 39*   Coagulation Profile:  Recent Labs Lab 07/11/16 0517  INR 1.41   Cardiac Enzymes: No results for input(s): CKTOTAL, CKMB, CKMBINDEX, TROPONINI in the last 168 hours. BNP (last 3 results) No results for input(s): PROBNP in the last 8760 hours. HbA1C: No results for input(s): HGBA1C in the last 72 hours. CBG: No results for input(s): GLUCAP in the last 168 hours. Lipid Profile: No results for input(s): CHOL, HDL, LDLCALC, TRIG, CHOLHDL, LDLDIRECT in the last 72 hours. Thyroid Function Tests: No results for input(s): TSH, T4TOTAL, FREET4, T3FREE, THYROIDAB in the last 72 hours. Anemia Panel: No results for input(s): VITAMINB12, FOLATE, FERRITIN, TIBC, IRON, RETICCTPCT in the last 72 hours. Sepsis Labs:  Recent Labs Lab 07/10/16 1746  LATICACIDVEN 1.31    Recent Results (from the past 240 hour(s))  Culture, blood (Routine x 2)     Status: None   Collection Time: 07/06/16  9:22 PM  Result Value Ref Range Status   Specimen Description BLOOD LEFT FOREARM  Final   Special Requests BOTTLES DRAWN AEROBIC AND ANAEROBIC BCAV  Final   Culture   Final    NO GROWTH 5 DAYS Performed at [facility], [place] [address] [place], [place] [address]    Report Status 07/12/2016 FINAL  Final  Culture, blood (Routine x 2)     Status: None   Collection Time: 07/06/16  9:27 PM  Result Value Ref Range Status   Specimen Description BLOOD RAC  Final   Special Requests BOTTLES DRAWN AEROBIC AND ANAEROBIC BCAV  Final   Culture   Final    NO GROWTH 5 DAYS Performed at [facility], [place] [address], [place], [place] [address]    Report Status 07/12/2016  FINAL  Final  Blood Culture (routine x  2)     Status: Abnormal (Preliminary result)   Collection Time: 07/10/16  5:35 PM  Result Value Ref Range Status   Specimen Description BLOOD LEFT ARM  Final   Special Requests   Final    BOTTLES DRAWN AEROBIC AND ANAEROBIC Blood Culture adequate volume   Culture  Setup Time   Final    GRAM POSITIVE COCCI IN CHAINS ANAEROBIC BOTTLE ONLY Organism ID to follow CRITICAL RESULT CALLED TO, READ BACK BY AND VERIFIED WITH: J. Scherrie November Pharm.D. 11:15 07/12/16 ([email or web address])    Culture (A)  Final    STREPTOCOCCUS INTERMEDIUS SUSCEPTIBILITIES TO FOLLOW Performed at [facility], [place] [address], [place], [place] [address]    Report Status PENDING  Incomplete  Blood Culture ID Panel (Reflexed)     Status: Abnormal   Collection Time: 07/10/16  5:35 PM  Result Value Ref Range Status   Enterococcus species NOT DETECTED NOT DETECTED Final   Listeria monocytogenes NOT DETECTED NOT DETECTED Final   Staphylococcus species NOT DETECTED NOT DETECTED Final   Staphylococcus aureus NOT DETECTED NOT DETECTED Final   Streptococcus species DETECTED (A) NOT DETECTED Final    Comment: Not Enterococcus species, Streptococcus agalactiae, Streptococcus pyogenes, or Streptococcus pneumoniae. CRITICAL RESULT CALLED TO, READ BACK BY AND VERIFIED WITH: J. Scherrie November Pharm.D. 11:15 07/12/16 ([email or web address])    Streptococcus agalactiae NOT DETECTED NOT DETECTED Final   Streptococcus pneumoniae NOT DETECTED NOT DETECTED Final   Streptococcus pyogenes NOT DETECTED NOT DETECTED Final   Acinetobacter baumannii NOT DETECTED NOT DETECTED Final   Enterobacteriaceae species NOT DETECTED NOT DETECTED Final   Enterobacter cloacae complex NOT DETECTED NOT DETECTED Final   Escherichia coli NOT DETECTED NOT DETECTED Final   Klebsiella oxytoca NOT DETECTED NOT DETECTED Final   Klebsiella pneumoniae NOT DETECTED NOT DETECTED Final   Proteus species NOT DETECTED NOT DETECTED Final   Serratia marcescens NOT  DETECTED NOT DETECTED Final   Haemophilus influenzae NOT DETECTED NOT DETECTED Final   Neisseria meningitidis NOT DETECTED NOT DETECTED Final   Pseudomonas aeruginosa NOT DETECTED NOT DETECTED Final   Candida albicans NOT DETECTED NOT DETECTED Final   Candida glabrata NOT DETECTED NOT DETECTED Final   Candida krusei NOT DETECTED NOT DETECTED Final   Candida parapsilosis NOT DETECTED NOT DETECTED Final   Candida tropicalis NOT DETECTED NOT DETECTED Final    Comment: Performed at [facility], [place] [address], [place], [place] [address]  Blood Culture (routine x 2)     Status: Abnormal   Collection Time: 07/10/16  5:43 PM  Result Value Ref Range Status   Specimen Description BLOOD RIGHT HAND  Final   Special Requests   Final    BOTTLES DRAWN AEROBIC AND ANAEROBIC Blood Culture adequate volume   Culture  Setup Time   Final    GRAM POSITIVE COCCI IN CHAINS ANAEROBIC BOTTLE ONLY CRITICAL VALUE NOTED.  VALUE IS CONSISTENT WITH PREVIOUSLY REPORTED AND CALLED VALUE. Performed at [facility], [place] [address], [place], [place] [address]    Culture STREPTOCOCCUS INTERMEDIUS (A)  Final   Report Status 07/15/2016 FINAL  Final   Organism ID, Bacteria STREPTOCOCCUS INTERMEDIUS  Final      Susceptibility   Streptococcus intermedius - MIC*    PENICILLIN <=0.06 SENSITIVE Sensitive     CEFTRIAXONE 0.25 SENSITIVE Sensitive     ERYTHROMYCIN >=8 RESISTANT Resistant     LEVOFLOXACIN <=0.25 SENSITIVE Sensitive     VANCOMYCIN 0.5 SENSITIVE Sensitive     *  STREPTOCOCCUS INTERMEDIUS  Aerobic/Anaerobic Culture (surgical/deep wound)     Status: None (Preliminary result)   Collection Time: 07/11/16  5:22 PM  Result Value Ref Range Status   Specimen Description ABSCESS HEPATIC  Final   Special Requests NONE  Final   Gram Stain   Final    ABUNDANT WBC PRESENT, PREDOMINANTLY PMN ABUNDANT GRAM POSITIVE COCCI IN PAIRS    Culture   Final    ABUNDANT STREPTOCOCCUS INTERMEDIUS SUSCEPTIBILITIES TO  FOLLOW Performed at [facility], [address] [place], [place] [address]    Report Status PENDING  Incomplete  MRSA PCR Screening     Status: None   Collection Time: 07/11/16  6:54 PM  Result Value Ref Range Status   MRSA by PCR NEGATIVE NEGATIVE Final    Comment:        The GeneXpert MRSA Assay (FDA approved for NASAL specimens only), is one component of a comprehensive MRSA colonization surveillance program. It is not intended to diagnose MRSA infection nor to guide or monitor treatment for MRSA infections.      Radiology Studies: No results found.  Scheduled Meds: . amLODipine  5 mg Oral Daily  . cholecalciferol  1,000 Units Oral Daily  . enoxaparin (LOVENOX) injection  40 mg Subcutaneous Q24H  . famotidine  20 mg Oral BID  . guaiFENesin  600 mg Oral BID  . isosorbide mononitrate  30 mg Oral QHS  . metoprolol tartrate  50 mg Oral BID  . nicotine  14 mg Transdermal Daily  . pantoprazole  40 mg Oral Daily  . sodium chloride flush  3 mL Intravenous Q12H  . vitamin B-12  1,000 mcg Oral Daily   Continuous Infusions: . cefTRIAXone (ROCEPHIN)  IV 2 g (07/15/16 1309)     LOS: 5 days   Aubriana Ravelo, Orpah Melter, MD [facility] Pager [phone]  If 7PM-7AM, please contact night-coverage [email or web address] Password [email or web address] 07/15/2016, 3:30 PM

## 2016-07-15 NOTE — Progress Notes (Signed)
Peripherally Inserted Central Catheter/Midline Placement  The IV Nurse has discussed with the patient and/or persons authorized to consent for the patient, the purpose of this procedure and the potential benefits and risks involved with this procedure.  The benefits include less needle sticks, lab draws from the catheter, and the patient may be discharged home with the catheter. Risks include, but not limited to, infection, bleeding, blood clot (thrombus formation), and puncture of an artery; nerve damage and irregular heartbeat and possibility to perform a PICC exchange if needed/ordered by physician.  Alternatives to this procedure were also discussed.  Bard Power PICC patient education guide, fact sheet on infection prevention and patient information card has been provided to patient /or left at bedside.    PICC/Midline Placement Documentation  PICC Single Lumen 07/15/16 PICC Right Brachial 38 cm 0 cm (Active)  Indication for Insertion or Continuance of Line Prolonged intravenous therapies 07/15/2016  8:00 AM  Exposed Catheter (cm) 0 cm 07/15/2016  8:00 AM  Site Assessment Clean;Dry;Intact 07/15/2016  8:00 AM  Line Status Flushed;Blood return noted;Saline locked 07/15/2016  8:00 AM  Dressing Type Transparent 07/15/2016  8:00 AM  Dressing Status Clean;Dry;Intact 07/15/2016  8:00 AM  Line Care Connections checked and tightened 07/15/2016  8:00 AM  Line Adjustment (NICU/IV Team Only) No 07/15/2016  8:00 AM  Dressing Intervention New dressing 07/15/2016  8:00 AM  Dressing Change Due 07/22/16 07/15/2016  8:00 AM       Rolena Infante 07/15/2016, 8:38 AM

## 2016-07-15 NOTE — Progress Notes (Signed)
Referring Physician(s):  Dr. Vincente Poli  Supervising Physician: Marybelle Killings  Patient Status:  [facility] - In-pt  Chief Complaint:   Hepatic abscesses  Subjective: PICC placed.  Much much alert and communicative.  No longer confused  Allergies: Celecoxib  Medications: Prior to Admission medications   Medication Sig Start Date End Date Taking? Authorizing Provider  acetaminophen (TYLENOL) 325 MG tablet Take 325 mg by mouth every 6 (six) hours as needed for fever.   Yes [provider]  amLODipine (NORVASC) 5 MG tablet TAKE 1 TABLET(5 MG) BY MOUTH DAILY 06/25/16  Yes Larey Dresser, MD  aspirin 81 MG EC tablet Take 81 mg by mouth daily.     Yes [provider]  atorvastatin (LIPITOR) 40 MG tablet Take 40 mg by mouth daily.   Yes [provider]  benazepril (LOTENSIN) 20 MG tablet TAKE 1 TABLET BY MOUTH DAILY 05/14/16  Yes Brunetta Jeans, PA-C  cholecalciferol (VITAMIN D) 1000 UNITS tablet Take 1,000 Units by mouth daily.    Yes [provider]  diphenhydrAMINE (BENADRYL) 25 MG tablet Take 50 mg by mouth every 6 (six) hours as needed.   Yes [provider]  fluticasone (FLONASE) 50 MCG/ACT nasal spray Place 2 sprays into both nostrils daily as needed. Patient taking differently: Place 2 sprays into both nostrils daily as needed for allergies.  12/07/14  Yes Brunetta Jeans, PA-C  HYDROcodone-acetaminophen (NORCO/VICODIN) 5-325 MG tablet Take 0.5-1 tablets by mouth every 6 (six) hours as needed for moderate pain. 05/22/16  Yes Brunetta Jeans, PA-C  isosorbide mononitrate (IMDUR) 30 MG 24 hr tablet TAKE 1 TABLET BY MOUTH AT BEDTIME 04/12/16  Yes Brunetta Jeans, PA-C  metoprolol (LOPRESSOR) 50 MG tablet TAKE 1 TABLET BY MOUTH TWICE DAILY 04/13/16  Yes Brunetta Jeans, PA-C  Omega-3 Fatty Acids (FISH OIL) 1000 MG CAPS Take 2 capsules by mouth 2 (two) times daily.     Yes [provider]  omeprazole (PRILOSEC) 20 MG capsule TAKE 1  CAPSULE(20 MG) BY MOUTH DAILY 07/03/16  Yes Ladene Artist, MD  vitamin B-12 (CYANOCOBALAMIN) 1000 MCG tablet Take 1,000 mcg by mouth daily.     Yes [provider]     Vital Signs: BP 136/70 (BP Location: Right Arm)   Pulse 80   Temp 98.8 F (37.1 C) (Oral)   Resp 18   Ht 5\' 10"  (1.778 m)   Wt 178 lb 2.1 oz (80.8 kg)   SpO2 98%   BMI 25.56 kg/m   Physical Exam  NAD, alert, communicative RUQ drain intact with serous output- amount not recorded.  Site intact.  Dressing clean and dry.   Imaging: [place] Abscess Drain  Result Date: 07/11/2016 INDICATION: 76 year old male with several month history of melena he is, fever, night sweats and recent CT imaging highly concerning for hepatic abscess. EXAM: Ultrasound-guided drain placement into hepatic abscess MEDICATIONS: The patient is currently admitted to the hospital and receiving intravenous antibiotics. The antibiotics were administered within an appropriate time frame prior to the initiation of the procedure. ANESTHESIA/SEDATION: Fentanyl 50 mcg IV; Versed 1 mg IV Moderate Sedation Time:  20 minutes The patient was continuously monitored during the procedure by the interventional radiology nurse under my direct supervision. COMPLICATIONS: None immediate. PROCEDURE: Informed written consent was obtained from the patient after a thorough discussion of the procedural risks, benefits and alternatives. All questions were addressed. A timeout was performed prior to the initiation of the procedure. Right  upper quadrant was interrogated with ultrasound. A complex cystic mass is identified in the right hemi liver. The overlying skin was sterilely prepped and draped in standard fashion using chlorhexidine skin prep. Local anesthesia was attained by infiltration with 1% lidocaine. A small dermatotomy was made. Under real-time sonographic guidance, a 17 gauge introducer needle was advanced into the fluid collection. Aspiration was performed yielding  frankly purulent material. Therefore, a 0.035 guidewire was coiled within the complex collection. The needle was removed. The percutaneous transhepatic tract was dilated to 10 [place] and a Cook 10.2 [place] all-purpose drainage catheter was advanced over the wire and formed within the complex collection. Aspiration yields approximately 90 mL of thick, purulent fluid. The drainage catheter was gently flushed and connected to JP bulb suction and then secured to the skin with 0 Prolene suture and an adhesive fixation device. IMPRESSION: 1. Aspiration yields frankly purulent fluid. 2. Technically successful placement of a 10 French percutaneous drainage catheter into the hepatic abscess. Aspiration yields approximately 90 mL of thick, purulent fluid which was sent for culture. Signed, Criselda Peaches, MD [facility] Radiology Electronically Signed   By: Jacqulynn Cadet M.D.   On: 07/11/2016 17:55   Dg Chest Port 1 View  Result Date: 07/11/2016 CLINICAL DATA:  Tachypnea EXAM: PORTABLE CHEST 1 VIEW COMPARISON:  07/06/2016 CXR FINDINGS: Low lung volumes with subsegmental atelectasis in the left mid lung and both lung bases. No pneumonic consolidation, effusion or pulmonary edema. No pneumothorax. Heart is top-normal in size with aortic atherosclerosis. No acute nor suspicious osseous abnormalities. IMPRESSION: Low lung volumes with left mid and bibasilar atelectasis. Aortic atherosclerosis. Electronically Signed   By: Ashley Royalty M.D.   On: 07/11/2016 19:15    Labs:  CBC:  Recent Labs  07/10/16 1735 07/11/16 0517 07/12/16 0859 07/13/16 0320  WBC 10.0 11.5* 11.8* 10.5  HGB 10.3* 10.1* 9.9* 9.5*  HCT 30.9* 30.6* 30.3* 28.7*  PLT 118* 95* 114* 127*    COAGS:  Recent Labs  07/06/16 2122 07/11/16 0517  INR 1.25 1.41    BMP:  Recent Labs  07/12/16 0859 07/13/16 0320 07/14/16 0802 07/15/16 0615  NA 142 140 140 140  K 3.3* 3.5 3.5 3.6    CL 111 111 108 105  CO2 26 23 24 26   GLUCOSE 110* 113* 96 101*  BUN 29* 21* 16 16  CALCIUM 7.9* 7.5* 7.6* 7.7*  CREATININE 0.89 0.75 0.58* 0.65  GFRNONAA >60 >60 >60 >60  GFRAA >60 >60 >60 >60    LIVER FUNCTION TESTS:  Recent Labs  07/12/16 0859 07/13/16 0320 07/14/16 0802 07/15/16 0615  BILITOT 1.8* 1.3* 1.5* 1.2  AST 41 32 23 18  ALT 45 35 26 20  ALKPHOS 309* 404* 435* 427*  PROT 5.2* 4.6* 4.7* 4.8*  ALBUMIN 1.9* 1.7* 1.7* 1.7*    Assessment and Plan: Hepatic abscesses s/p drain placement 07/11/16. Still with low grade temps yesterday.  WBC now normal.  Tbili 1.5. Cultures grew streptococcus intermedius.  Remains on IV abx which will likely continue as output.  Plan is to obtain CT in the next few days as long as he continues improve. Mental status  improving.  Continue routine drain care. IR to follow.  Electronically Signed: Docia Barrier, PA 07/15/2016, 7:48 AM   I spent a total of 15 Minutes at the the patient's bedside AND on the patient's hospital floor or unit, greater than 50% of which was counseling/coordinating care for liver abscesses.

## 2016-07-16 ENCOUNTER — Other Ambulatory Visit: Payer: Self-pay | Admitting: Radiology

## 2016-07-16 DIAGNOSIS — A419 Sepsis, unspecified organism: Secondary | ICD-10-CM

## 2016-07-16 DIAGNOSIS — I2510 Atherosclerotic heart disease of native coronary artery without angina pectoris: Secondary | ICD-10-CM

## 2016-07-16 DIAGNOSIS — R6520 Severe sepsis without septic shock: Secondary | ICD-10-CM

## 2016-07-16 DIAGNOSIS — R7881 Bacteremia: Secondary | ICD-10-CM

## 2016-07-16 DIAGNOSIS — B955 Unspecified streptococcus as the cause of diseases classified elsewhere: Secondary | ICD-10-CM

## 2016-07-16 DIAGNOSIS — D696 Thrombocytopenia, unspecified: Secondary | ICD-10-CM

## 2016-07-16 DIAGNOSIS — A409 Streptococcal sepsis, unspecified: Secondary | ICD-10-CM

## 2016-07-16 DIAGNOSIS — K750 Abscess of liver: Secondary | ICD-10-CM

## 2016-07-16 LAB — AEROBIC/ANAEROBIC CULTURE W GRAM STAIN (SURGICAL/DEEP WOUND)

## 2016-07-16 LAB — AEROBIC/ANAEROBIC CULTURE (SURGICAL/DEEP WOUND)

## 2016-07-16 MED ORDER — HYDROCODONE-ACETAMINOPHEN 5-325 MG PO TABS
1.0000 | ORAL_TABLET | Freq: Four times a day (QID) | ORAL | 0 refills | Status: DC | PRN
Start: 2016-07-16 — End: 2016-07-19

## 2016-07-16 MED ORDER — DEXTROSE 5 % IV SOLN: 2.0000 g | INTRAVENOUS | 0 refills | Status: DC

## 2016-07-16 MED ORDER — DEXTROSE 5 % IV SOLN
2.0000 g | INTRAVENOUS | Status: DC
  Filled 2016-07-16: qty 2

## 2016-07-16 MED ORDER — HEPARIN SOD (PORK) LOCK FLUSH 100 UNIT/ML IV SOLN
250.0000 [IU] | INTRAVENOUS | Status: AC | PRN
  Administered 2016-07-16: 250 [IU]

## 2016-07-16 NOTE — Care Management Important Message (Signed)
Important Message  Patient Details  Name: CELEDONIO SORTINO MRN: [id number] Date of Birth: 10/07/1940   Medicare Important Message Given:  Yes    Kerin Salen 07/16/2016, 11:54 AMImportant Message  Patient Details  Name: RIKI GEHRING MRN: [id number] Date of Birth: 1940-04-14   Medicare Important Message Given:  Yes    Kerin Salen 07/16/2016, 11:54 AM

## 2016-07-16 NOTE — Progress Notes (Signed)
Patient given discharge instructions, and verbalized an understanding of all discharge instructions.  Patient agrees with discharge plan, and is being discharged in stable medical condition.  [facility] came by to instruct patient on picc line care.  Patient and wife also given instructions on how to take care of jp drain.  Patient given transportation via wheelchair.

## 2016-07-16 NOTE — Progress Notes (Signed)
Referring Physician(s): Chiu,S  Supervising Physician: Corrie Mckusick  Patient Status:  Brandon [name] - In-pt  Chief Complaint:  Hepatic abscess  Subjective: Pt doing ok; has some abd soreness but denies N/V; eating ok; more alert   Allergies: Celecoxib  Medications: Prior to Admission medications   Medication Sig Start Date End Date Taking? Authorizing Provider  acetaminophen (TYLENOL) 325 MG tablet Take 325 mg by mouth every 6 (six) hours as needed for fever.   Yes [provider]  amLODipine (NORVASC) 5 MG tablet TAKE 1 TABLET(5 MG) BY MOUTH DAILY 06/25/16  Yes Larey Dresser, MD  aspirin 81 MG EC tablet Take 81 mg by mouth daily.     Yes [provider]  atorvastatin (LIPITOR) 40 MG tablet Take 40 mg by mouth daily.   Yes [provider]  benazepril (LOTENSIN) 20 MG tablet TAKE 1 TABLET BY MOUTH DAILY 05/14/16  Yes Brunetta Jeans, PA-C  cholecalciferol (VITAMIN D) 1000 UNITS tablet Take 1,000 Units by mouth daily.    Yes [provider]  diphenhydrAMINE (BENADRYL) 25 MG tablet Take 50 mg by mouth every 6 (six) hours as needed.   Yes [provider]  fluticasone (FLONASE) 50 MCG/ACT nasal spray Place 2 sprays into both nostrils daily as needed. Patient taking differently: Place 2 sprays into both nostrils daily as needed for allergies.  12/07/14  Yes Brunetta Jeans, PA-C  HYDROcodone-acetaminophen (NORCO/VICODIN) 5-325 MG tablet Take 0.5-1 tablets by mouth every 6 (six) hours as needed for moderate pain. 05/22/16  Yes Brunetta Jeans, PA-C  isosorbide mononitrate (IMDUR) 30 MG 24 hr tablet TAKE 1 TABLET BY MOUTH AT BEDTIME 04/12/16  Yes Brunetta Jeans, PA-C  metoprolol (LOPRESSOR) 50 MG tablet TAKE 1 TABLET BY MOUTH TWICE DAILY 04/13/16  Yes Brunetta Jeans, PA-C  Omega-3 Fatty Acids (FISH OIL) 1000 MG CAPS Take 2 capsules by mouth 2 (two) times daily.     Yes [provider]  omeprazole (PRILOSEC) 20 MG capsule TAKE 1  CAPSULE(20 MG) BY MOUTH DAILY 07/03/16  Yes Ladene Artist, MD  vitamin B-12 (CYANOCOBALAMIN) 1000 MCG tablet Take 1,000 mcg by mouth daily.     Yes [provider]     Vital Signs: BP 135/78 (BP Location: Left Arm)   Pulse 88   Temp 99.3 F (37.4 C) (Oral)   Resp 16   Ht 5\' 10"  (1.778 m)   Wt 178 lb 2.1 oz (80.8 kg)   SpO2 97%   BMI 25.56 kg/m   Physical Exam RUQ drain intact, dressing dry, site mildly tender; output 10 cc beige colored fluid; cx- strept  Imaging: No results found.  Labs:  CBC:  Recent Labs  07/10/16 1735 07/11/16 0517 07/12/16 0859 07/13/16 0320  WBC 10.0 11.5* 11.8* 10.5  HGB 10.3* 10.1* 9.9* 9.5*  HCT 30.9* 30.6* 30.3* 28.7*  PLT 118* 95* 114* 127*    COAGS:  Recent Labs  07/06/16 2122 07/11/16 0517  INR 1.25 1.41    BMP:  Recent Labs  07/12/16 0859 07/13/16 0320 07/14/16 0802 07/15/16 0615  NA 142 140 140 140  K 3.3* 3.5 3.5 3.6  CL 111 111 108 105  CO2 26 23 24 26   GLUCOSE 110* 113* 96 101*  BUN 29* 21* 16 16  CALCIUM 7.9* 7.5* 7.6* 7.7*  CREATININE 0.89 0.75 0.58* 0.65  GFRNONAA >60 >60 >60 >60  GFRAA >60 >60 >60 >60    LIVER FUNCTION TESTS:  Recent Labs  07/12/16 0859 07/13/16 0320 07/14/16 0802 07/15/16 0615  BILITOT 1.8* 1.3* 1.5* 1.2  AST 41 32 23 18  ALT 45 35 26 20  ALKPHOS 309* 404* 435* 427*  PROT 5.2* 4.6* 4.7* 4.8*  ALBUMIN 1.9* 1.7* 1.7* 1.7*    Assessment and Plan: S/p drainage of hepatic abscess 6/6; drain fluid cx  growing Streptococcus intermedius; temp 99.3; no new labs; rec f/u CT on 6/13 either as IP or OP. If patient discharged today, recommend once daily irrigation of drain with 5 mL sterile normal saline, recording of drain output and dressing changes as needed. He can be scheduled at IR drain clinic with follow up CT as outpatient .   Electronically Signed: D. Rowe Robert, PA-C 07/16/2016, 12:48 PM   I spent a total of 15 minutes at the the patient's bedside AND on the  patient's [name] floor or unit, greater than 50% of which was counseling/coordinating care for hepatic abscess drain    Patient ID: Brandon [name], male   DOB: [birth year], 76 y.o.   MRN: [id number]

## 2016-07-16 NOTE — Progress Notes (Signed)
[facility] pt for [facility] following this hospital admission.  [facility] will provide [facility] services to provide/support IV ABX at home.  [facility] will follow until DC to support home care services at DC as ordered by MD team.  If patient discharges after hours, please call [phone].   Larry Sierras 07/16/2016, 11:07 AM

## 2016-07-16 NOTE — Care Management Note (Signed)
Case Management Note  Patient Details  Name: Brandon [name] MRN: [id number] Date of Birth: [birth year]  Subjective/Objective:                  Liver abscess and strep intermedius bacteremia with sepsis present on admission  Action/Plan:  Date:  July 16, 2016 Chart reviewed for concurrent status and case management needs. Will continue to follow patient progress. Discharge Planning: following for needs Expected discharge date: [id number] Velva Harman, BSN, [place], [place]  Expected Discharge Date:   (unknown)               Expected Discharge Plan:  [place]  In-House Referral:     Discharge planning Services  CM Consult  Post Acute Care Choice:  Durable Medical Equipment, Home Health Choice offered to:  Patient  DME Arranged:  IV pump/equipment DME Agency:  [place]:  IV Antibiotics HH Agency:  [place]  Status of Service:  In process, will continue to follow  If discussed at Long Length of Stay Meetings, dates discussed:    Additional Comments:  Leeroy Cha, RN 07/16/2016, 10:39 AM

## 2016-07-16 NOTE — Progress Notes (Signed)
Date: July 16, 2016 Chart reviewed for discharge orders: Iv ABx RN and PT arranged through [place]. Vernia Buff, [phone]

## 2016-07-16 NOTE — Discharge Summary (Signed)
Physician Discharge Summary  Brandon [name] KXF:[id number] DOB: Mar 30, 1940 DOA: 07/10/2016  PCP: Brunetta Jeans, PA-C  Admit date: 07/10/2016 Discharge date: 07/16/2016  Admitted From: Home Disposition:  Home   Recommendations for Outpatient Follow-up:  1. Follow up with PCP in 1-2 weeks 2. Please obtain BMP/CBC in one week   Home Health: YES Equipment/Devices: HHRN for dressing changes, hepatic drain management/flush, IV abx with PICC line  Discharge Condition: Stable CODE STATUS-- FULL Diet recommendation: Heart Healthy    Brief/Interim Summary: 76 y.o.malewith medical history significant for coronary artery disease, GERD, osteoarthritis, and hypertension, now presenting to the emergency department for evaluation of subjective fevers, chills, loss of appetite with 15 pound weight loss, and nonspecific malaise. Patient reports that he had been in his usual state of health until one month ago when he developed a nonspecific malaise with subjective fevers and chills. He also began to develop a loss of appetite around that time. Since then, he has been evaluated by his PCP and in the emergency department, was treated with a course of antibiotics for possibletickborne illness though titers were negative, and has not experienced any improvement in his symptoms. He reports a 15 pound weight loss over this interval. He notes a long-standing history of occasional loose stools, but denies any significant diarrhea or constipation. He denies any significant abdominal pain, reports mild nausea, but denies vomiting. There has not been any significant cough or dyspnea and no chest pain or palpitations. He denies any recent long distance travel, GI tract instrumentation, or sick contacts. He denies melena or hematochezia. He had a colonoscopy in February with polyps with pathology report indicating no high-grade dysplasia or malignancy   Discharge Diagnoses:  Sepsis -present at time of  admission -secondary to bacteremia and liver abscess -sepsis physiology improved  Liver abscess and strep intermedius bacteremia with sepsis present on admission - Pt presents tachycardia, fevers with CT reviewed. Findings suggestive of liver abscess, however cystic neoplasm also considered - He is noted to have 15-lb wt-loss and developed a new anemia over this interval  - Blood cultures from 07/06/16 have NGTD; blood cultures were collected again in ED, reviewed.  - Initially started on Rocephin and Flagyl - Pt became floridly septic and patient transitioned to meropenem and vanc - Pt underwent drain placement on 07/11/16 with purulence drained. - Sepsis resolved. - Blood cultures thus far pos for strep species. Fluid cx thus far pos for gm pos in pairs. - Blood cx are pos for strep intermedius 2/2 - discussed case with ID who recommends total 3 weeks of rocephin - home with ceftriaxone 2 grams daily with last dose on 08/01/16 -PICC line placed 07/15/2016. Case management consulted to assist with home health needs, pending -The [place] Controlled Substance Reporting System has been queried for this patient for the past 12 months prior to prescribing any opioids.  norco #90 prescribed every 1-2 months by one provider since 09/2015, except most recent ED visit when he was given percocet #15 on 06/16/16   thrombocytopenia  - due to sepsis -improving with antibiotic therapy - No bleeding is evident, no pallor; pt denies melena or hematochezia  - Colonoscopy in Feb '18 with 2 polyps, path report no high-grade dysplasia or malignancy  - Labs reviewed. Platelets improving  CAD - Continue Lopressor and Imdur  - Hold benazepril given soft BP and dehydration with elevated BUN  - Hold Lipitor in light of elevated LFT's - Remains medically stable. 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 Consider ICU delerium, toxic-metabolic encephalopathy secondary to active infection - Have ordered ammonia level and reviewed. 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 Imaging was reviewed by Dr. Silverio Decamp. GI recommendations to NOT anticoagulate. - GI recommends repeat CT scan in several weeks for interval change -Overall stable   Discharge Instructions  Discharge Instructions    Diet - low sodium heart healthy    Complete by:  As directed    Increase activity slowly    Complete by:  As directed      Allergies as of 07/16/2016      Reactions   Celecoxib    REACTION: hives and itching      Medication List    STOP taking these medications   benazepril 20 MG tablet Commonly known as:  LOTENSIN     TAKE these medications   acetaminophen 325 MG tablet Commonly known as:  TYLENOL Take 325 mg by mouth every 6 (six) hours as needed for fever.   amLODipine 5 MG tablet Commonly known as:  NORVASC TAKE 1 TABLET(5 MG) BY MOUTH DAILY   aspirin 81 MG EC tablet Take 81 mg by mouth daily.   atorvastatin 40 MG tablet Commonly known as:  LIPITOR Take 40 mg by mouth daily.   cefTRIAXone 2 g in dextrose 5 % 50 mL Inject 2 g into the vein daily. Last dose on 08/01/16   cholecalciferol 1000 units tablet Commonly known as:  VITAMIN D Take 1,000 Units by mouth daily.   diphenhydrAMINE 25 MG tablet Commonly known as:   BENADRYL Take 50 mg by mouth every 6 (six) hours as needed.   Fish Oil 1000 MG Caps Take 2 capsules by mouth 2 (two) times daily.   fluticasone 50 MCG/ACT nasal spray Commonly known as:  FLONASE Place 2 sprays into both nostrils daily as needed. What changed:  reasons to take this   HYDROcodone-acetaminophen 5-325 MG tablet Commonly known as:  NORCO/VICODIN Take 1 tablet by mouth every 6 (six) hours as needed for moderate pain. What changed:  how much to take   isosorbide mononitrate 30 MG 24 hr tablet Commonly known as:  IMDUR TAKE 1 TABLET BY MOUTH AT BEDTIME   metoprolol tartrate 50 MG tablet Commonly known as:  LOPRESSOR TAKE 1 TABLET BY MOUTH TWICE DAILY   omeprazole 20 MG capsule Commonly known as:  PRILOSEC TAKE 1 CAPSULE(20 MG) BY MOUTH DAILY   vitamin B-12 1000 MCG tablet Commonly known as:  CYANOCOBALAMIN Take 1,000 mcg by mouth daily.       Allergies  Allergen Reactions  . Celecoxib     REACTION: hives and itching    Consultations:  IR   Procedures/Studies: Dg Chest 2 View  Result Date: 07/06/2016 CLINICAL DATA:  Acute onset of fever and  nausea.  Initial encounter. EXAM: CHEST  2 VIEW COMPARISON:  Chest radiograph from 06/16/2016 FINDINGS: The lungs are well-aerated and clear. There is no evidence of focal opacification, pleural effusion or pneumothorax. The heart is normal in size; the mediastinal contour is within normal limits. No acute osseous abnormalities are seen. IMPRESSION: No acute cardiopulmonary process seen. Electronically Signed   By: Garald Balding M.D.   On: 07/06/2016 22:23   [place] Abscess Drain  Result Date: 07/11/2016 INDICATION: 76 year old male with several month history of melena he is, fever, night sweats and recent CT imaging highly concerning for hepatic abscess. EXAM: Ultrasound-guided drain placement into hepatic abscess MEDICATIONS: The patient is currently admitted to the hospital and receiving intravenous antibiotics. The  antibiotics were administered within an appropriate time frame prior to the initiation of the procedure. ANESTHESIA/SEDATION: Fentanyl 50 mcg IV; Versed 1 mg IV Moderate Sedation Time:  20 minutes The patient was continuously monitored during the procedure by the interventional radiology nurse under my direct supervision. COMPLICATIONS: None immediate. PROCEDURE: Informed written consent was obtained from the patient after a thorough discussion of the procedural risks, benefits and alternatives. All questions were addressed. A timeout was performed prior to the initiation of the procedure. Right upper quadrant was interrogated with ultrasound. A complex cystic mass is identified in the right hemi liver. The overlying skin was sterilely prepped and draped in standard fashion using chlorhexidine skin prep. Local anesthesia was attained by infiltration with 1% lidocaine. A small dermatotomy was made. Under real-time sonographic guidance, a 17 gauge introducer needle was advanced into the fluid collection. Aspiration was performed yielding frankly purulent material. Therefore, a 0.035 guidewire was coiled within the complex collection. The needle was removed. The percutaneous transhepatic tract was dilated to 10 [place] and a Cook 10.2 [place] all-purpose drainage catheter was advanced over the wire and formed within the complex collection. Aspiration yields approximately 90 mL of thick, purulent fluid. The drainage catheter was gently flushed and connected to JP bulb suction and then secured to the skin with 0 Prolene suture and an adhesive fixation device. IMPRESSION: 1. Aspiration yields frankly purulent fluid. 2. Technically successful placement of a 10 French percutaneous drainage catheter into the hepatic abscess. Aspiration yields approximately 90 mL of thick, purulent fluid which was sent for culture. Signed, Criselda Peaches, MD [facility]  Electronically Signed   By: Jacqulynn Cadet M.D.   On: 07/11/2016 17:55   Ct Abdomen Pelvis W Contrast  Result Date: 07/10/2016 CLINICAL DATA:  Nausea vomiting and decreased appetite EXAM: CT ABDOMEN AND PELVIS WITH CONTRAST TECHNIQUE: Multidetector CT imaging of the abdomen and pelvis was performed using the standard protocol following bolus administration of intravenous contrast. CONTRAST:  171mL ISOVUE-300 IOPAMIDOL (ISOVUE-300) INJECTION 61% COMPARISON:  None. FINDINGS: Lower chest: Lung bases demonstrate no acute consolidation or pleural effusion. Vascular calcification. No pericardial effusion. Hepatobiliary: Abnormal low-density at the porta hepatis which becomes less apparent on delayed views. Heterogenous low-density in the right hepatic lobe, measuring approximately 7.4 by 6.1 cm. No biliary dilatation. Surgical clips in the gallbladder fossa. Pancreas: Unremarkable. No pancreatic ductal dilatation or surrounding inflammatory changes. Spleen: Normal in size without focal abnormality. Adrenals/Urinary Tract: 1 cm low-density left adrenal gland nodule. Kidneys demonstrate no hydronephrosis. Punctate stone mid pole left kidney. Probable cortical scarring in the lower pole of the left kidney. The bladder is unremarkable. Stomach/Bowel: Mild edema and soft tissue thickening at the gastroduodenal junction. No dilated small bowel. Colon diverticular  disease. No acute wall thickening. Normal appendix Vascular/Lymphatic: Extensive atherosclerotic vascular calcification. Portal vein at the hilus and the splenic vein appear patent, however low density noted at the porta hepatis in the region of right and left portal vessels. Periportal edema is present. Infrarenal abdominal aortic aneurysm measuring 3.9 cm in maximum diameter. Reproductive: Enlarged prostate gland Other: Small free fluid in the pelvis.  No free air Musculoskeletal: Postsurgical changes at L4-L5. There are degenerative changes. No acute or  suspicious bone lesion. IMPRESSION: 1. 7.4 cm heterogenous cystic mass in the right hepatic lobe, this is suspicious for liver abscess in the appropriate clinical setting. Cystic neoplasm also a consideration. 2. Low-density in the porta hepatis could represent altered perfusion ; unable to document patency of the right and left portal veins, consider correlation with Doppler to evaluate for patency of the intrahepatic portal vessels. 3. Small free fluid in the pelvis. Sigmoid colon diverticular disease without acute inflammation 4. Mild soft tissue stranding at the gastroduodenal junction, could relate to mild gastro duodenitis. 5. 3.9 cm infrarenal abdominal aortic aneurysm. Recommend followup by ultrasound in 2 years. This recommendation follows ACR consensus guidelines: White Paper of the ACR Incidental Findings Committee II on Vascular Findings. J Am Coll Radiol 2013; 10:789-794. Electronically Signed   By: Donavan Foil M.D.   On: 07/10/2016 19:38   Dg Chest Port 1 View  Result Date: 07/11/2016 CLINICAL DATA:  Tachypnea EXAM: PORTABLE CHEST 1 VIEW COMPARISON:  07/06/2016 CXR FINDINGS: Low lung volumes with subsegmental atelectasis in the left mid lung and both lung bases. No pneumonic consolidation, effusion or pulmonary edema. No pneumothorax. Heart is top-normal in size with aortic atherosclerosis. No acute nor suspicious osseous abnormalities. IMPRESSION: Low lung volumes with left mid and bibasilar atelectasis. Aortic atherosclerosis. Electronically Signed   By: Ashley Royalty M.D.   On: 07/11/2016 19:15   US Abdomen Limited Ruq  Result Date: 07/10/2016 CLINICAL DATA:  Abnormal CT.  Portal vein obstruction. EXAM: ULTRASOUND ABDOMEN LIMITED RIGHT UPPER QUADRANT COMPARISON:  Abdominal CT earlier this day. FINDINGS: Gallbladder: Surgically absent. Common bile duct: Diameter: 5.7 mm, normal. Liver: Heterogeneous 7.0 x 7.5 x 6.4 cm lesion in the right hepatic lobe corresponding to abnormality on CT. There is  an internal cystic components. Hepatic echotexture is heterogeneous, particularly centrally at the porta hepatis. There is normal directional flow of the portal vein at the porta hepatis, however possible cut off with questionable filling defect and surrounding collaterals at the porta hepatis. The central and left hepatic veins are visualized and are patent. IMPRESSION: 1. Questionable filling defect in the portal vein at the porta hepatis with surrounding collaterals, extrahepatic portal vein demonstrates normal directional flow. 2. Heterogeneous 7 x 7.5 x 6.4 cm lesion in the right hepatic lobe with some internal cystic components. Recommend MRI characterization with contrast. Electronically Signed   By: Jeb Levering M.D.   On: 07/10/2016 22:14         Discharge Exam: Vitals:   07/15/16 2032 07/16/16 0518  BP: 140/70 135/78  Pulse: 83 88  Resp: 17 16  Temp: 98.8 F (37.1 C) 99.3 F (37.4 C)   Vitals:   07/15/16 0449 07/15/16 1352 07/15/16 2032 07/16/16 0518  BP: 136/70 130/74 140/70 135/78  Pulse: 80 73 83 88  Resp: 18 20 17 16   Temp: 98.8 F (37.1 C) 97.4 F (36.3 C) 98.8 F (37.1 C) 99.3 F (37.4 C)  TempSrc: Oral Oral Oral Oral  SpO2: 98% 98% 99% 97%  Weight:  Height:        General: Pt is alert, awake, not in acute distress Cardiovascular: RRR, S1/S2 +, no rubs, no gallops Respiratory: CTA bilaterally, no wheezing, no rhonchi Abdominal: Soft, RUQ tender around drain, ND, bowel sounds + Extremities: trace LE edema, no cyanosis   The results of significant diagnostics from this hospitalization (including imaging, microbiology, ancillary and laboratory) are listed below for reference.    Significant Diagnostic Studies: Dg Chest 2 View  Result Date: 07/06/2016 CLINICAL DATA:  Acute onset of fever and nausea.  Initial encounter. EXAM: CHEST  2 VIEW COMPARISON:  Chest radiograph from 06/16/2016 FINDINGS: The lungs are well-aerated and clear. There is no evidence  of focal opacification, pleural effusion or pneumothorax. The heart is normal in size; the mediastinal contour is within normal limits. No acute osseous abnormalities are seen. IMPRESSION: No acute cardiopulmonary process seen. Electronically Signed   By: Garald Balding M.D.   On: 07/06/2016 22:23   [place] Abscess Drain  Result Date: 07/11/2016 INDICATION: 76 year old male with several month history of melena he is, fever, night sweats and recent CT imaging highly concerning for hepatic abscess. EXAM: Ultrasound-guided drain placement into hepatic abscess MEDICATIONS: The patient is currently admitted to the hospital and receiving intravenous antibiotics. The antibiotics were administered within an appropriate time frame prior to the initiation of the procedure. ANESTHESIA/SEDATION: Fentanyl 50 mcg IV; Versed 1 mg IV Moderate Sedation Time:  20 minutes The patient was continuously monitored during the procedure by the interventional radiology nurse under my direct supervision. COMPLICATIONS: None immediate. PROCEDURE: Informed written consent was obtained from the patient after a thorough discussion of the procedural risks, benefits and alternatives. All questions were addressed. A timeout was performed prior to the initiation of the procedure. Right upper quadrant was interrogated with ultrasound. A complex cystic mass is identified in the right hemi liver. The overlying skin was sterilely prepped and draped in standard fashion using chlorhexidine skin prep. Local anesthesia was attained by infiltration with 1% lidocaine. A small dermatotomy was made. Under real-time sonographic guidance, a 17 gauge introducer needle was advanced into the fluid collection. Aspiration was performed yielding frankly purulent material. Therefore, a 0.035 guidewire was coiled within the complex collection. The needle was removed. The percutaneous transhepatic tract was dilated to 10 [place] and a Cook 10.2 [place] all-purpose drainage  catheter was advanced over the wire and formed within the complex collection. Aspiration yields approximately 90 mL of thick, purulent fluid. The drainage catheter was gently flushed and connected to JP bulb suction and then secured to the skin with 0 Prolene suture and an adhesive fixation device. IMPRESSION: 1. Aspiration yields frankly purulent fluid. 2. Technically successful placement of a 10 French percutaneous drainage catheter into the hepatic abscess. Aspiration yields approximately 90 mL of thick, purulent fluid which was sent for culture. Signed, Criselda Peaches, MD [facility] Radiology Electronically Signed   By: Jacqulynn Cadet M.D.   On: 07/11/2016 17:55   Ct Abdomen Pelvis W Contrast  Result Date: 07/10/2016 CLINICAL DATA:  Nausea vomiting and decreased appetite EXAM: CT ABDOMEN AND PELVIS WITH CONTRAST TECHNIQUE: Multidetector CT imaging of the abdomen and pelvis was performed using the standard protocol following bolus administration of intravenous contrast. CONTRAST:  153mL ISOVUE-300 IOPAMIDOL (ISOVUE-300) INJECTION 61% COMPARISON:  None. FINDINGS: Lower chest: Lung bases demonstrate no acute consolidation or pleural effusion. Vascular calcification. No pericardial effusion. Hepatobiliary: Abnormal low-density at the porta hepatis which becomes less apparent on delayed  views. Heterogenous low-density in the right hepatic lobe, measuring approximately 7.4 by 6.1 cm. No biliary dilatation. Surgical clips in the gallbladder fossa. Pancreas: Unremarkable. No pancreatic ductal dilatation or surrounding inflammatory changes. Spleen: Normal in size without focal abnormality. Adrenals/Urinary Tract: 1 cm low-density left adrenal gland nodule. Kidneys demonstrate no hydronephrosis. Punctate stone mid pole left kidney. Probable cortical scarring in the lower pole of the left kidney. The bladder is unremarkable. Stomach/Bowel: Mild edema and soft  tissue thickening at the gastroduodenal junction. No dilated small bowel. Colon diverticular disease. No acute wall thickening. Normal appendix Vascular/Lymphatic: Extensive atherosclerotic vascular calcification. Portal vein at the hilus and the splenic vein appear patent, however low density noted at the porta hepatis in the region of right and left portal vessels. Periportal edema is present. Infrarenal abdominal aortic aneurysm measuring 3.9 cm in maximum diameter. Reproductive: Enlarged prostate gland Other: Small free fluid in the pelvis.  No free air Musculoskeletal: Postsurgical changes at L4-L5. There are degenerative changes. No acute or suspicious bone lesion. IMPRESSION: 1. 7.4 cm heterogenous cystic mass in the right hepatic lobe, this is suspicious for liver abscess in the appropriate clinical setting. Cystic neoplasm also a consideration. 2. Low-density in the porta hepatis could represent altered perfusion ; unable to document patency of the right and left portal veins, consider correlation with Doppler to evaluate for patency of the intrahepatic portal vessels. 3. Small free fluid in the pelvis. Sigmoid colon diverticular disease without acute inflammation 4. Mild soft tissue stranding at the gastroduodenal junction, could relate to mild gastro duodenitis. 5. 3.9 cm infrarenal abdominal aortic aneurysm. Recommend followup by ultrasound in 2 years. This recommendation follows ACR consensus guidelines: White Paper of the ACR Incidental Findings Committee II on Vascular Findings. J Am Coll Radiol 2013; 10:789-794. Electronically Signed   By: Donavan Foil M.D.   On: 07/10/2016 19:38   Dg Chest Port 1 View  Result Date: 07/11/2016 CLINICAL DATA:  Tachypnea EXAM: PORTABLE CHEST 1 VIEW COMPARISON:  07/06/2016 CXR FINDINGS: Low lung volumes with subsegmental atelectasis in the left mid lung and both lung bases. No pneumonic consolidation, effusion or pulmonary edema. No pneumothorax. Heart is top-normal  in size with aortic atherosclerosis. No acute nor suspicious osseous abnormalities. IMPRESSION: Low lung volumes with left mid and bibasilar atelectasis. Aortic atherosclerosis. Electronically Signed   By: Ashley Royalty M.D.   On: 07/11/2016 19:15   US Abdomen Limited Ruq  Result Date: 07/10/2016 CLINICAL DATA:  Abnormal CT.  Portal vein obstruction. EXAM: ULTRASOUND ABDOMEN LIMITED RIGHT UPPER QUADRANT COMPARISON:  Abdominal CT earlier this day. FINDINGS: Gallbladder: Surgically absent. Common bile duct: Diameter: 5.7 mm, normal. Liver: Heterogeneous 7.0 x 7.5 x 6.4 cm lesion in the right hepatic lobe corresponding to abnormality on CT. There is an internal cystic components. Hepatic echotexture is heterogeneous, particularly centrally at the porta hepatis. There is normal directional flow of the portal vein at the porta hepatis, however possible cut off with questionable filling defect and surrounding collaterals at the porta hepatis. The central and left hepatic veins are visualized and are patent. IMPRESSION: 1. Questionable filling defect in the portal vein at the porta hepatis with surrounding collaterals, extrahepatic portal vein demonstrates normal directional flow. 2. Heterogeneous 7 x 7.5 x 6.4 cm lesion in the right hepatic lobe with some internal cystic components. Recommend MRI characterization with contrast. Electronically Signed   By: Jeb Levering M.D.   On: 07/10/2016 22:14     Microbiology: Recent Results (from the past 240  hour(s))  Culture, blood (Routine x 2)     Status: None   Collection Time: 07/06/16  9:22 PM  Result Value Ref Range Status   Specimen Description BLOOD LEFT FOREARM  Final   Special Requests BOTTLES DRAWN AEROBIC AND ANAEROBIC BCAV  Final   Culture   Final    NO GROWTH 5 DAYS Performed at [facility], [place] [address], [place], [place] [address]    Report Status 07/12/2016 FINAL  Final  Culture, blood (Routine x 2)     Status: None   Collection Time:  07/06/16  9:27 PM  Result Value Ref Range Status   Specimen Description BLOOD RAC  Final   Special Requests BOTTLES DRAWN AEROBIC AND ANAEROBIC BCAV  Final   Culture   Final    NO GROWTH 5 DAYS Performed at [facility], [place] [address], [place], [place] [address]    Report Status 07/12/2016 FINAL  Final  Blood Culture (routine x 2)     Status: Abnormal   Collection Time: 07/10/16  5:35 PM  Result Value Ref Range Status   Specimen Description BLOOD LEFT ARM  Final   Special Requests   Final    BOTTLES DRAWN AEROBIC AND ANAEROBIC Blood Culture adequate volume   Culture  Setup Time   Final    GRAM POSITIVE COCCI IN CHAINS ANAEROBIC BOTTLE ONLY Organism ID to follow CRITICAL RESULT CALLED TO, READ BACK BY AND VERIFIED WITH: J. Scherrie November Pharm.D. 11:15 07/12/16 ([email or web address])    Culture (A)  Final    STREPTOCOCCUS INTERMEDIUS SUSCEPTIBILITIES PERFORMED ON PREVIOUS CULTURE WITHIN THE LAST 5 DAYS. Performed at [facility], [place] [address] [place], [place] [address]    Report Status 07/15/2016 FINAL  Final  Blood Culture ID Panel (Reflexed)     Status: Abnormal   Collection Time: 07/10/16  5:35 PM  Result Value Ref Range Status   Enterococcus species NOT DETECTED NOT DETECTED Final   Listeria monocytogenes NOT DETECTED NOT DETECTED Final   Staphylococcus species NOT DETECTED NOT DETECTED Final   Staphylococcus aureus NOT DETECTED NOT DETECTED Final   Streptococcus species DETECTED (A) NOT DETECTED Final    Comment: Not Enterococcus species, Streptococcus agalactiae, Streptococcus pyogenes, or Streptococcus pneumoniae. CRITICAL RESULT CALLED TO, READ BACK BY AND VERIFIED WITH: J. Scherrie November Pharm.D. 11:15 07/12/16 ([email or web address])    Streptococcus agalactiae NOT DETECTED NOT DETECTED Final   Streptococcus pneumoniae NOT DETECTED NOT DETECTED Final   Streptococcus pyogenes NOT DETECTED NOT DETECTED Final   Acinetobacter baumannii NOT DETECTED NOT DETECTED Final   Enterobacteriaceae species NOT  DETECTED NOT DETECTED Final   Enterobacter cloacae complex NOT DETECTED NOT DETECTED Final   Escherichia coli NOT DETECTED NOT DETECTED Final   Klebsiella oxytoca NOT DETECTED NOT DETECTED Final   Klebsiella pneumoniae NOT DETECTED NOT DETECTED Final   Proteus species NOT DETECTED NOT DETECTED Final   Serratia marcescens NOT DETECTED NOT DETECTED Final   Haemophilus influenzae NOT DETECTED NOT DETECTED Final   Neisseria meningitidis NOT DETECTED NOT DETECTED Final   Pseudomonas aeruginosa NOT DETECTED NOT DETECTED Final   Candida albicans NOT DETECTED NOT DETECTED Final   Candida glabrata NOT DETECTED NOT DETECTED Final   Candida krusei NOT DETECTED NOT DETECTED Final   Candida parapsilosis NOT DETECTED NOT DETECTED Final   Candida tropicalis NOT DETECTED NOT DETECTED Final    Comment: Performed at [facility], [place] [address] [place], [place] [address]  Blood Culture (routine x  2)     Status: Abnormal   Collection Time: 07/10/16  5:43 PM  Result Value Ref Range Status   Specimen Description BLOOD RIGHT HAND  Final   Special Requests   Final    BOTTLES DRAWN AEROBIC AND ANAEROBIC Blood Culture adequate volume   Culture  Setup Time   Final    GRAM POSITIVE COCCI IN CHAINS ANAEROBIC BOTTLE ONLY CRITICAL VALUE NOTED.  VALUE IS CONSISTENT WITH PREVIOUSLY REPORTED AND CALLED VALUE. Performed at [facility], [place] [address] [place], [place] [address]    Culture STREPTOCOCCUS INTERMEDIUS (A)  Final   Report Status 07/15/2016 FINAL  Final   Organism ID, Bacteria STREPTOCOCCUS INTERMEDIUS  Final      Susceptibility   Streptococcus intermedius - MIC*    PENICILLIN <=0.06 SENSITIVE Sensitive     CEFTRIAXONE 0.25 SENSITIVE Sensitive     ERYTHROMYCIN >=8 RESISTANT Resistant     LEVOFLOXACIN <=0.25 SENSITIVE Sensitive     VANCOMYCIN 0.5 SENSITIVE Sensitive     * STREPTOCOCCUS INTERMEDIUS  Aerobic/Anaerobic Culture (surgical/deep wound)     Status: None   Collection Time:  07/11/16  5:22 PM  Result Value Ref Range Status   Specimen Description ABSCESS HEPATIC  Final   Special Requests NONE  Final   Gram Stain   Final    ABUNDANT WBC PRESENT, PREDOMINANTLY PMN ABUNDANT GRAM POSITIVE COCCI IN PAIRS    Culture   Final    ABUNDANT STREPTOCOCCUS INTERMEDIUS NO ANAEROBES ISOLATED Performed at [facility], [place] [address] [place],  [address]    Report Status 07/16/2016 FINAL  Final   Organism ID, Bacteria STREPTOCOCCUS INTERMEDIUS  Final      Susceptibility   Streptococcus intermedius - MIC*    PENICILLIN <=0.06 SENSITIVE Sensitive     CEFTRIAXONE <=0.12 SENSITIVE Sensitive     ERYTHROMYCIN >=8 RESISTANT Resistant     LEVOFLOXACIN <=0.25 SENSITIVE Sensitive     VANCOMYCIN 0.25 SENSITIVE Sensitive     * ABUNDANT STREPTOCOCCUS INTERMEDIUS  MRSA PCR Screening     Status: None   Collection Time: 07/11/16  6:54 PM  Result Value Ref Range Status   MRSA by PCR NEGATIVE NEGATIVE Final    Comment:        The GeneXpert MRSA Assay (FDA approved for NASAL specimens only), is one component of a comprehensive MRSA colonization surveillance program. It is not intended to diagnose MRSA infection nor to guide or monitor treatment for MRSA infections.      Labs: Basic Metabolic Panel:  Recent Labs Lab 07/11/16 0517 07/12/16 0859 07/13/16 0320 07/14/16 0802 07/15/16 0615  NA 141 142 140 140 140  K 3.6 3.3* 3.5 3.5 3.6  CL 111 111 111 108 105  CO2 24 26 23 24 26   GLUCOSE 91 110* 113* 96 101*  BUN 27* 29* 21* 16 16  CREATININE 0.79 0.89 0.75 0.58* 0.65  CALCIUM 7.4* 7.9* 7.5* 7.6* 7.7*   Liver Function Tests:  Recent Labs Lab 07/11/16 0517 07/12/16 0859 07/13/16 0320 07/14/16 0802 07/15/16 0615  AST 49* 41 32 23 18  ALT 52 45 35 26 20  ALKPHOS 286* 309* 404* 435* 427*  BILITOT 2.1* 1.8* 1.3* 1.5* 1.2  PROT 5.0* 5.2* 4.6* 4.7* 4.8*  ALBUMIN 1.8* 1.9* 1.7* 1.7* 1.7*   No results for input(s): LIPASE, AMYLASE in the last 168  hours.  Recent Labs Lab 07/12/16 1457  AMMONIA 39*   CBC:  Recent Labs Lab 07/09/16 1520 07/10/16  1735 07/11/16 0517 07/12/16 0859 07/13/16 0320  WBC 13.4* 10.0 11.5* 11.8* 10.5  NEUTROABS 11.5* 8.5* 9.9*  --   --   HGB 11.7* 10.3* 10.1* 9.9* 9.5*  HCT 35.7* 30.9* 30.6* 30.3* 28.7*  MCV 83.0 82.0 82.9 83.9 82.7  PLT 115.0* 118* 95* 114* 127*   Cardiac Enzymes: No results for input(s): CKTOTAL, CKMB, CKMBINDEX, TROPONINI in the last 168 hours. BNP: Invalid input(s): POCBNP CBG: No results for input(s): GLUCAP in the last 168 hours.  Time coordinating discharge:  Greater than 30 minutes  Signed:  Wilhelmena Zea, DO [facility] Pager: [phone] 07/16/2016, 1:30 PM

## 2016-07-17 ENCOUNTER — Telehealth: Payer: Self-pay | Admitting: Physician Assistant

## 2016-07-17 ENCOUNTER — Other Ambulatory Visit: Payer: Self-pay | Admitting: Family Medicine

## 2016-07-17 DIAGNOSIS — Z452 Encounter for adjustment and management of vascular access device: Secondary | ICD-10-CM | POA: Diagnosis not present

## 2016-07-17 DIAGNOSIS — K750 Abscess of liver: Secondary | ICD-10-CM | POA: Diagnosis not present

## 2016-07-17 DIAGNOSIS — D696 Thrombocytopenia, unspecified: Secondary | ICD-10-CM | POA: Diagnosis not present

## 2016-07-17 DIAGNOSIS — I10 Essential (primary) hypertension: Secondary | ICD-10-CM | POA: Diagnosis not present

## 2016-07-17 DIAGNOSIS — M1990 Unspecified osteoarthritis, unspecified site: Secondary | ICD-10-CM | POA: Diagnosis not present

## 2016-07-17 DIAGNOSIS — I2510 Atherosclerotic heart disease of native coronary artery without angina pectoris: Secondary | ICD-10-CM | POA: Diagnosis not present

## 2016-07-17 DIAGNOSIS — D649 Anemia, unspecified: Secondary | ICD-10-CM | POA: Diagnosis not present

## 2016-07-17 DIAGNOSIS — K5790 Diverticulosis of intestine, part unspecified, without perforation or abscess without bleeding: Secondary | ICD-10-CM | POA: Diagnosis not present

## 2016-07-17 DIAGNOSIS — Z5181 Encounter for therapeutic drug level monitoring: Secondary | ICD-10-CM | POA: Diagnosis not present

## 2016-07-17 DIAGNOSIS — Z434 Encounter for attention to other artificial openings of digestive tract: Secondary | ICD-10-CM | POA: Diagnosis not present

## 2016-07-17 NOTE — Telephone Encounter (Signed)
See hospital discharge summary. Patient needs TCM follow-up 1 week from discharge with me.  Please assess how he is doing at present.  Thank you.

## 2016-07-17 NOTE — Telephone Encounter (Signed)
Spoke with patients wife, patient present.     Transition Care Management Follow-up Telephone Call   Date discharged? 07/16/2016   How have you been since you were released from the hospital? "he feels lowsy". Very weak, just wants to lay in bed.    Do you understand why you were in the hospital? yes   Do you understand the discharge instructions? yes   Where were you discharged to? Home.    Items Reviewed:  Medications reviewed: yes  Allergies reviewed: yes  Dietary changes reviewed: yes, "he's not eating much though"  Referrals reviewed: yes, Home health nurse to visit today. Teaching wife how to infuse antibiotics and empty drainage tube. Patient states he does not need PT right now, can walk but not for long periods of time.    Functional Questionnaire:   Activities of Daily Living (ADLs):   He states they are independent in the following: Currently needs assistance with all.  States they require assistance with the following: ambulation, bathing and hygiene, feeding, continence, grooming, toileting and dressing   Any transportation issues/concerns?: no   Any patient concerns? no   Confirmed importance and date/time of follow-up visits scheduled yes  Provider Appointment booked with PCP on 07/18/16 @ 11:30.   Confirmed with patient if condition begins to worsen call PCP or go to the ER.  Patient was given the office number and encouraged to call back with question or concerns.  : yes

## 2016-07-18 ENCOUNTER — Encounter: Payer: Self-pay | Admitting: Physician Assistant

## 2016-07-18 ENCOUNTER — Ambulatory Visit (INDEPENDENT_AMBULATORY_CARE_PROVIDER_SITE_OTHER): Payer: Medicare Other | Admitting: Physician Assistant

## 2016-07-18 VITALS — BP 122/78 | HR 68 | Temp 98.2°F | Resp 14 | Ht 70.0 in | Wt 187.0 lb

## 2016-07-18 DIAGNOSIS — D696 Thrombocytopenia, unspecified: Secondary | ICD-10-CM

## 2016-07-18 DIAGNOSIS — A409 Streptococcal sepsis, unspecified: Secondary | ICD-10-CM | POA: Diagnosis not present

## 2016-07-18 DIAGNOSIS — I10 Essential (primary) hypertension: Secondary | ICD-10-CM | POA: Diagnosis not present

## 2016-07-18 DIAGNOSIS — K750 Abscess of liver: Secondary | ICD-10-CM | POA: Diagnosis not present

## 2016-07-18 LAB — COMPREHENSIVE METABOLIC PANEL WITH GFR
ALT: 11 U/L (ref 0–53)
AST: 19 U/L (ref 0–37)
Albumin: 2.1 g/dL — ABNORMAL LOW (ref 3.5–5.2)
Alkaline Phosphatase: 425 U/L — ABNORMAL HIGH (ref 39–117)
BUN: 9 mg/dL (ref 6–23)
CO2: 31 meq/L (ref 19–32)
Calcium: 7.6 mg/dL — ABNORMAL LOW (ref 8.4–10.5)
Chloride: 104 meq/L (ref 96–112)
Creatinine, Ser: 0.51 mg/dL (ref 0.40–1.50)
GFR: 168.96 mL/min (ref >60.00)
Glucose, Bld: 115 mg/dL — ABNORMAL HIGH (ref 70–99)
Potassium: 4 meq/L (ref 3.5–5.1)
Sodium: 139 meq/L (ref 135–145)
Total Bilirubin: 0.8 mg/dL (ref 0.2–1.2)
Total Protein: 5 g/dL — ABNORMAL LOW (ref 6.0–8.3)

## 2016-07-18 LAB — CBC
HCT: 29.4 % — ABNORMAL LOW (ref 39.0–52.0)
Hemoglobin: 9.4 g/dL — ABNORMAL LOW (ref 13.0–17.0)
MCHC: 32.1 g/dL (ref 30.0–36.0)
MCV: 85.1 fl (ref 78.0–100.0)
PLATELETS: 233 10*3/uL (ref 150.0–400.0)
RBC: 3.45 Mil/uL — AB (ref 4.22–5.81)
RDW: 16.1 % — ABNORMAL HIGH (ref 11.5–15.5)
WBC: 6.7 10*3/uL (ref 4.0–10.5)

## 2016-07-18 NOTE — Patient Instructions (Signed)
Please keep hydrated. Limit salt intake but eat a well-balanced diet.  Wear compression stockings as directed. Check daily weight -- this should improve or stay stable daily. Should not increase. If climbing, call or come see me. If you note any significant change overnight --increase in 3-5 lbs, go to ER.   Make sure drain continues to drain properly. Antibiotics daily as directed. I am going to try and move up your appointment with GI.   Follow-up with me on Monday.

## 2016-07-18 NOTE — Progress Notes (Signed)
Pre visit review using our clinic review tool, if applicable. No additional management support is needed unless otherwise documented below in the visit note. 

## 2016-07-19 ENCOUNTER — Inpatient Hospital Stay (HOSPITAL_COMMUNITY)
Admission: RE | Admit: 2016-07-19 | Discharge: 2016-07-21 | DRG: 186 | Disposition: A | Discharge status: Home / Self Care | Payer: Medicare Other | Source: Ambulatory Visit | Attending: Internal Medicine | Admitting: Internal Medicine

## 2016-07-19 ENCOUNTER — Ambulatory Visit (HOSPITAL_COMMUNITY)
Admission: RE | Admit: 2016-07-19 | Discharge: 2016-07-19 | Disposition: A | Discharge status: Home / Self Care | Payer: Medicare Other | Source: Ambulatory Visit | Attending: Family Medicine | Admitting: Family Medicine

## 2016-07-19 ENCOUNTER — Telehealth: Payer: Self-pay | Admitting: Physician Assistant

## 2016-07-19 ENCOUNTER — Encounter (HOSPITAL_COMMUNITY): Payer: Self-pay

## 2016-07-19 ENCOUNTER — Other Ambulatory Visit (HOSPITAL_COMMUNITY): Payer: Self-pay | Admitting: Interventional Radiology

## 2016-07-19 ENCOUNTER — Ambulatory Visit (HOSPITAL_COMMUNITY)
Admission: RE | Admit: 2016-07-19 | Discharge: 2016-07-19 | Disposition: A | Discharge status: Home / Self Care | Payer: Medicare Other | Source: Ambulatory Visit | Attending: Interventional Radiology | Admitting: Interventional Radiology

## 2016-07-19 ENCOUNTER — Encounter (HOSPITAL_COMMUNITY): Payer: Self-pay | Admitting: General Surgery

## 2016-07-19 ENCOUNTER — Ambulatory Visit (HOSPITAL_COMMUNITY): Payer: Medicare Other

## 2016-07-19 DIAGNOSIS — B955 Unspecified streptococcus as the cause of diseases classified elsewhere: Secondary | ICD-10-CM | POA: Diagnosis present

## 2016-07-19 DIAGNOSIS — I2510 Atherosclerotic heart disease of native coronary artery without angina pectoris: Secondary | ICD-10-CM | POA: Diagnosis not present

## 2016-07-19 DIAGNOSIS — Z9049 Acquired absence of other specified parts of digestive tract: Secondary | ICD-10-CM

## 2016-07-19 DIAGNOSIS — E785 Hyperlipidemia, unspecified: Secondary | ICD-10-CM | POA: Diagnosis not present

## 2016-07-19 DIAGNOSIS — Z959 Presence of cardiac and vascular implant and graft, unspecified: Secondary | ICD-10-CM

## 2016-07-19 DIAGNOSIS — K750 Abscess of liver: Secondary | ICD-10-CM

## 2016-07-19 DIAGNOSIS — Z79899 Other long term (current) drug therapy: Secondary | ICD-10-CM | POA: Diagnosis not present

## 2016-07-19 DIAGNOSIS — I350 Nonrheumatic aortic (valve) stenosis: Secondary | ICD-10-CM | POA: Diagnosis not present

## 2016-07-19 DIAGNOSIS — Z886 Allergy status to analgesic agent status: Secondary | ICD-10-CM | POA: Diagnosis not present

## 2016-07-19 DIAGNOSIS — D509 Iron deficiency anemia, unspecified: Secondary | ICD-10-CM | POA: Diagnosis present

## 2016-07-19 DIAGNOSIS — Z7982 Long term (current) use of aspirin: Secondary | ICD-10-CM

## 2016-07-19 DIAGNOSIS — Z87891 Personal history of nicotine dependence: Secondary | ICD-10-CM | POA: Diagnosis not present

## 2016-07-19 DIAGNOSIS — Z8673 Personal history of transient ischemic attack (TIA), and cerebral infarction without residual deficits: Secondary | ICD-10-CM | POA: Diagnosis not present

## 2016-07-19 DIAGNOSIS — J90 Pleural effusion, not elsewhere classified: Principal | ICD-10-CM

## 2016-07-19 DIAGNOSIS — R188 Other ascites: Secondary | ICD-10-CM | POA: Diagnosis not present

## 2016-07-19 DIAGNOSIS — I10 Essential (primary) hypertension: Secondary | ICD-10-CM | POA: Diagnosis present

## 2016-07-19 DIAGNOSIS — K651 Peritoneal abscess: Secondary | ICD-10-CM | POA: Diagnosis not present

## 2016-07-19 DIAGNOSIS — E784 Other hyperlipidemia: Secondary | ICD-10-CM | POA: Diagnosis not present

## 2016-07-19 DIAGNOSIS — K210 Gastro-esophageal reflux disease with esophagitis: Secondary | ICD-10-CM | POA: Diagnosis present

## 2016-07-19 DIAGNOSIS — R7881 Bacteremia: Secondary | ICD-10-CM | POA: Diagnosis present

## 2016-07-19 DIAGNOSIS — Z8249 Family history of ischemic heart disease and other diseases of the circulatory system: Secondary | ICD-10-CM | POA: Diagnosis not present

## 2016-07-19 DIAGNOSIS — Z8261 Family history of arthritis: Secondary | ICD-10-CM | POA: Diagnosis not present

## 2016-07-19 DIAGNOSIS — N400 Enlarged prostate without lower urinary tract symptoms: Secondary | ICD-10-CM | POA: Diagnosis present

## 2016-07-19 DIAGNOSIS — Z95828 Presence of other vascular implants and grafts: Secondary | ICD-10-CM | POA: Diagnosis not present

## 2016-07-19 DIAGNOSIS — Z820 Family history of epilepsy and other diseases of the nervous system: Secondary | ICD-10-CM | POA: Diagnosis not present

## 2016-07-19 HISTORY — PX: IR THORACENTESIS ASP PLEURAL SPACE W/IMG GUIDE: IMG5380

## 2016-07-19 HISTORY — DX: Low back pain: M54.5

## 2016-07-19 HISTORY — DX: Other chronic pain: G89.29

## 2016-07-19 HISTORY — DX: Unspecified viral hepatitis B without hepatic coma: B19.10

## 2016-07-19 HISTORY — DX: Low back pain, unspecified: M54.50

## 2016-07-19 HISTORY — DX: Cardiac murmur, unspecified: R01.1

## 2016-07-19 HISTORY — DX: Abscess of liver: K75.0

## 2016-07-19 LAB — COMPREHENSIVE METABOLIC PANEL
ALBUMIN: 1.8 g/dL — AB (ref 3.5–5.0)
ALT: 15 U/L — AB (ref 17–63)
AST: 24 U/L (ref 15–41)
Alkaline Phosphatase: 359 U/L — ABNORMAL HIGH (ref 38–126)
Anion gap: 8 (ref 5–15)
BUN: 5 mg/dL — AB (ref 6–20)
CHLORIDE: 102 mmol/L (ref 101–111)
CO2: 26 mmol/L (ref 22–32)
CREATININE: 0.54 mg/dL — AB (ref 0.61–1.24)
Calcium: 7.5 mg/dL — ABNORMAL LOW (ref 8.9–10.3)
GFR calc Af Amer: >60 mL/min (ref >60)
GFR calc non Af Amer: >60 mL/min (ref >60)
GLUCOSE: 107 mg/dL — AB (ref 65–99)
POTASSIUM: 3.5 mmol/L (ref 3.5–5.1)
Sodium: 136 mmol/L (ref 135–145)
Total Bilirubin: 0.9 mg/dL (ref 0.3–1.2)
Total Protein: 5.6 g/dL — ABNORMAL LOW (ref 6.5–8.1)

## 2016-07-19 LAB — CBC WITH DIFFERENTIAL/PLATELET
BASOS ABS: 0 10*3/uL (ref 0.0–0.1)
BASOS PCT: 1 %
EOS PCT: 1 %
Eosinophils Absolute: 0 10*3/uL (ref 0.0–0.7)
HEMATOCRIT: 28.9 % — AB (ref 39.0–52.0)
Hemoglobin: 9.1 g/dL — ABNORMAL LOW (ref 13.0–17.0)
Lymphocytes Relative: 23 %
Lymphs Abs: 1.5 10*3/uL (ref 0.7–4.0)
MCH: 26.9 pg (ref 26.0–34.0)
MCHC: 31.5 g/dL (ref 30.0–36.0)
MCV: 85.5 fL (ref 78.0–100.0)
Monocytes Absolute: 0.3 10*3/uL (ref 0.1–1.0)
Monocytes Relative: 4 %
NEUTROS ABS: 4.6 10*3/uL (ref 1.7–7.7)
Neutrophils Relative %: 71 %
PLATELETS: 252 10*3/uL (ref 150–400)
RBC: 3.38 MIL/uL — AB (ref 4.22–5.81)
RDW: 16.4 % — AB (ref 11.5–15.5)
WBC: 6.4 10*3/uL (ref 4.0–10.5)

## 2016-07-19 LAB — PROTIME-INR
INR: 1.29
Prothrombin Time: 16.1 seconds — ABNORMAL HIGH (ref 11.4–15.2)

## 2016-07-19 LAB — BODY FLUID CELL COUNT WITH DIFFERENTIAL
Eos, Fluid: 0 %
LYMPHS FL: 4 %
MONOCYTE-MACROPHAGE-SEROUS FLUID: 14 % — AB (ref 50–90)
NEUTROPHIL FLUID: 82 % — AB (ref 0–25)
Other Cells, Fluid: 0 %
WBC FLUID: 10250 uL — AB (ref 0–1000)

## 2016-07-19 LAB — GRAM STAIN

## 2016-07-19 MED ORDER — ONDANSETRON HCL 4 MG/2ML IJ SOLN: 4.0000 mg | Freq: Four times a day (QID) | INTRAMUSCULAR | Status: DC | PRN

## 2016-07-19 MED ORDER — SODIUM CHLORIDE 0.9 % IV SOLN
INTRAVENOUS | Status: DC
  Administered 2016-07-19: 18:00:00 via INTRAVENOUS

## 2016-07-19 MED ORDER — IOPAMIDOL (ISOVUE-300) INJECTION 61%
INTRAVENOUS | Status: AC
  Filled 2016-07-19: qty 50

## 2016-07-19 MED ORDER — SODIUM CHLORIDE 0.9% FLUSH
3.0000 mL | Freq: Two times a day (BID) | INTRAVENOUS | Status: DC
  Administered 2016-07-20: 3 mL via INTRAVENOUS

## 2016-07-19 MED ORDER — OXYCODONE HCL 5 MG PO TABS
5.0000 mg | ORAL_TABLET | ORAL | Status: DC | PRN
  Administered 2016-07-19 – 2016-07-20 (×3): 5 mg via ORAL
  Filled 2016-07-19 (×3): qty 1

## 2016-07-19 MED ORDER — FISH OIL 1000 MG PO CAPS: 2.0000 | ORAL_CAPSULE | Freq: Two times a day (BID) | ORAL | Status: DC

## 2016-07-19 MED ORDER — VITAMIN D 1000 UNITS PO TABS
1000.0000 [IU] | ORAL_TABLET | Freq: Every day | ORAL | Status: DC
  Administered 2016-07-19 – 2016-07-21 (×3): 1000 [IU] via ORAL
  Filled 2016-07-19 (×5): qty 1

## 2016-07-19 MED ORDER — LIDOCAINE HCL (PF) 1 % IJ SOLN
INTRAMUSCULAR | Status: AC | PRN
  Administered 2016-07-19: 10 mL

## 2016-07-19 MED ORDER — OXYCODONE HCL 5 MG PO TABS
10.0000 mg | ORAL_TABLET | Freq: Once | ORAL | Status: AC
  Administered 2016-07-19: 10 mg via ORAL
  Filled 2016-07-19: qty 2

## 2016-07-19 MED ORDER — AMLODIPINE BESYLATE 5 MG PO TABS
5.0000 mg | ORAL_TABLET | Freq: Every day | ORAL | Status: DC
  Administered 2016-07-19 – 2016-07-21 (×3): 5 mg via ORAL
  Filled 2016-07-19 (×3): qty 1

## 2016-07-19 MED ORDER — DIPHENHYDRAMINE HCL 25 MG PO CAPS
50.0000 mg | ORAL_CAPSULE | Freq: Four times a day (QID) | ORAL | Status: DC | PRN
  Administered 2016-07-19 – 2016-07-20 (×2): 50 mg via ORAL
  Filled 2016-07-19 (×6): qty 2

## 2016-07-19 MED ORDER — OMEGA-3-ACID ETHYL ESTERS 1 G PO CAPS
1.0000 g | ORAL_CAPSULE | Freq: Two times a day (BID) | ORAL | Status: DC
  Administered 2016-07-19 – 2016-07-21 (×4): 1 g via ORAL
  Filled 2016-07-19 (×4): qty 1

## 2016-07-19 MED ORDER — LIDOCAINE HCL 1 % IJ SOLN
INTRAMUSCULAR | Status: AC
  Filled 2016-07-19: qty 20

## 2016-07-19 MED ORDER — ONDANSETRON HCL 4 MG PO TABS: 4.0000 mg | ORAL_TABLET | Freq: Four times a day (QID) | ORAL | Status: DC | PRN

## 2016-07-19 MED ORDER — ATORVASTATIN CALCIUM 40 MG PO TABS
40.0000 mg | ORAL_TABLET | Freq: Every day | ORAL | Status: DC
  Administered 2016-07-20 – 2016-07-21 (×2): 40 mg via ORAL
  Filled 2016-07-19 (×3): qty 1

## 2016-07-19 MED ORDER — CEFTRIAXONE SODIUM 2 G IJ SOLR
2.0000 g | INTRAMUSCULAR | Status: DC
  Administered 2016-07-19 – 2016-07-21 (×3): 2 g via INTRAVENOUS
  Filled 2016-07-19 (×3): qty 2

## 2016-07-19 MED ORDER — IOPAMIDOL (ISOVUE-300) INJECTION 61%
INTRAVENOUS | Status: AC
  Administered 2016-07-19: 100 mL via INTRAVENOUS
  Filled 2016-07-19: qty 100

## 2016-07-19 MED ORDER — ISOSORBIDE MONONITRATE ER 30 MG PO TB24
30.0000 mg | ORAL_TABLET | Freq: Every day | ORAL | Status: DC
  Administered 2016-07-19 – 2016-07-20 (×2): 30 mg via ORAL
  Filled 2016-07-19 (×2): qty 1

## 2016-07-19 NOTE — Procedures (Signed)
Ultrasound-guided diagnostic and therapeutic right thoracentesis performed yielding 0.65 liters of slightly cloudy, serosanguineous colored fluid. No immediate complications. Follow-up chest x-ray pending.       Brandon [name] E 4:14 PM 07/19/2016

## 2016-07-19 NOTE — Progress Notes (Signed)
Pt having c/o of pain at  JP drain site rating 9/10. Next schedule dose for PRN oxy not due until 2144. MD notified. Will continue to monitor. Isac Caddy, RN

## 2016-07-19 NOTE — Telephone Encounter (Signed)
Caller with [facility] asking for verbal orders to do home health physical therapy 1 x a wk for 4 wks for strength, balance, conditioning.

## 2016-07-19 NOTE — H&P (Signed)
History and Physical    [name]:[id number] DOB: 17-Dec-1940 DOA: 07/19/2016  Referring MD/NP/PA: Saverio [name]  PCP: [place] Jeans, PA-C  Outpatient Specialists: IR  Saverio [name] Patient coming from: home  Chief Complaint:   HPI: Brandon [name] is a 76 y.o. male with medical history significant of CAD, GERD, osteoarthritis, hypertension who was recently admitted and treated for a liver abscess on 6/5. He had a drain placed by IR on 6/6. PICC line in place and has been receiving Rocephin 2 grams daily since discharge.  Brandon culture was positive for Strep,     Pt was seen in Interventional radiology today for evaluation of drainage around  drain site.    Pt had a ct scan and ultrasound which showed a new pleural effusion.  IR PA did a thoracentesis and drained 650 cc of red fluid.  Dr. Deniece Portela radiologist request pt be admitted for Infectious disease consult and antibiotics.  He reported fluid may be reactive.  He is planning to attempt repositioning of liver drain  07/20/16.  Pt relays Brandon history that illness began 1 month ago when he developed fevers. Pt saw his primary MD and was treated for a tick borne illness with oral antibiotic. He  had progressively worsening illness.    Pt was discharged home on 6/11 and has had continued weakness   Review of Systems: As per HPI otherwise 10 point review of systems negative.    Past Medical History:  Diagnosis Date  . Aortic stenosis    mild echocardiogram 8/09 EF 60%, showed no regional wall motion abnml, mild LVH, mod focal basal septal hypertrophy and mild dyastolic dysfunction. 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 mean gradient across aortic valve was 8 mmHG. also mild L atrial enlargement and normal RV size and function. minimal AS on LHC in 7/10.   . Arthritis   . BPH (benign prostatic hypertrophy)   . CAD (coronary artery disease)    LCH (7/10) totally occluded proximal RCA with very  robuse L to R collaterals, 50% proximal LAD stenosis, EF 65%, medical management.    . Chicken pox   . Diverticulosis   . Esophageal stricture   . GERD (gastroesophageal reflux disease)   . Hiatal hernia   . HLD (hyperlipidemia)   . HTN (hypertension)   . Low back pain    s/p surgical fusion  . Osteoarthritis   . Smoker   . TIA (transient ischemic attack)    hx  . Tubular adenoma of colon 2009    Past Surgical History:  Procedure Laterality Date  . cataract surgery  01/2012/02/2012   both eyes/lens implanted  . CHOLECYSTECTOMY  1994  . L4-5 discectomy  4/98   Dr. Sherwood Gambler   . lumbar decompression and Ray cage  8/98   L4-5 Dr. Rita Ohara  . LUMBAR LAMINECTOMY  9/04   and microdiscectomy. L3-4. Dr. Sherwood Gambler  . MOUTH SURGERY     FULL DENTURES     reports that he quit smoking about 13 months ago. His smoking use included Cigarettes. He has a 75.00 pack-year smoking history. He has quit using smokeless tobacco. He reports that he does not drink alcohol Brandon use drugs.  Allergies  Allergen Reactions  . Celecoxib Hives and Itching    Brandon History  Problem Relation Age of Onset  . Heart disease Father 21       Living  . Coronary artery disease Father  CABG  . Alzheimer's disease Mother 56       Deceased  . Arthritis/Rheumatoid Mother   . Stomach cancer Maternal Uncle   . Brain cancer Maternal Aunt        x2  . Arthritis Brother        DJD  . Obesity Daughter        Had Bypass Sx     Prior to Admission medications   Medication Sig Start Date End Date Taking? Authorizing Provider  acetaminophen (TYLENOL) 325 MG tablet Take 325 mg by mouth every 6 (six) hours as needed for fever.    [provider]  amLODipine (NORVASC) 5 MG tablet TAKE 1 TABLET(5 MG) BY MOUTH DAILY 06/25/16   Larey Dresser, MD  aspirin 81 MG EC tablet Take 81 mg by mouth daily.      [provider]  atorvastatin (LIPITOR) 40 MG tablet Take 40 mg by mouth daily.    [provider]  cefTRIAXone 2 g in dextrose 5 % 50 mL Inject 2 g into Brandon vein daily. Last dose on 08/01/16 07/16/16   TatShanon Brow, MD  cholecalciferol (VITAMIN D) 1000 UNITS tablet Take 1,000 Units by mouth daily.     [provider]  diphenhydrAMINE (BENADRYL) 25 MG tablet Take 50 mg by mouth every 6 (six) hours as needed.    [provider]  fluticasone (FLONASE) 50 MCG/ACT nasal spray Place 2 sprays into both nostrils daily as needed. Patient taking differently: Place 2 sprays into both nostrils daily as needed for allergies.  12/07/14   [place] Jeans, PA-C  HYDROcodone-acetaminophen (NORCO/VICODIN) 5-325 MG tablet Take 1 tablet by mouth every 6 (six) hours as needed for moderate pain. 07/16/16   Orson Eva, MD  isosorbide mononitrate (IMDUR) 30 MG 24 hr tablet TAKE 1 TABLET BY MOUTH AT BEDTIME 04/12/16   [place] Jeans, PA-C  metoprolol (LOPRESSOR) 50 MG tablet TAKE 1 TABLET BY MOUTH TWICE DAILY 04/13/16   [place] Jeans, PA-C  Omega-3 Fatty Acids (FISH OIL) 1000 MG CAPS Take 2 capsules by mouth 2 (two) times daily.      [provider]  omeprazole (PRILOSEC) 20 MG capsule TAKE 1 CAPSULE(20 MG) BY MOUTH DAILY 07/03/16   Ladene Artist, MD  vitamin B-12 (CYANOCOBALAMIN) 1000 MCG tablet Take 1,000 mcg by mouth daily.      [provider]    Physical Exam: There were no vitals filed for this visit.    Constitutional: NAD, calm, comfortable There were no vitals filed for this visit. Eyes: PERRL, lids and conjunctivae normal ENMT: Mucous membranes are moist. Posterior pharynx clear of any exudate Brandon lesions.Normal dentition.  Neck: normal, supple, no masses, no thyromegaly Respiratory: clear to auscultation bilaterally, no wheezing, no crackles. Normal respiratory effort. No accessory muscle use. Tiny puncture wound right back at thoracentesis site Cardiovascular: Regular rate and rhythm, no murmurs / rubs / gallops. No extremity edema. 2+ pedal pulses.  No carotid bruits.  Abdomen: no tenderness, no masses palpated. No hepatosplenomegaly. Bowel sounds positive.  No drainage at site curently Musculoskeletal: no clubbing / cyanosis. No joint deformity upper and lower extremities. Good ROM, no contractures. Normal muscle tone.  Skin: no rashes, lesions, ulcers. No induration Neurologic: CN 2-12 grossly intact. Sensation intact, DTR normal. Strength 5/5 in all 4.  Psychiatric: Normal judgment and insight. Alert and oriented x 3. Normal mood.    Labs on Admission: I have personally reviewed following labs and imaging  studies  CBC:  Recent Labs Lab 07/13/16 0320 07/18/16 1246  WBC 10.5 6.7  HGB 9.5* 9.4*  HCT 28.7* 29.4*  MCV 82.7 85.1  PLT 127* 545.6   Basic Metabolic Panel:  Recent Labs Lab 07/13/16 0320 07/14/16 0802 07/15/16 0615 07/18/16 1246  NA 140 140 140 139  K 3.5 3.5 3.6 4.0  CL 111 108 105 104  CO2 23 24 26 31   GLUCOSE 113* 96 101* 115*  BUN 21* 16 16 9   CREATININE 0.75 0.58* 0.65 0.51  CALCIUM 7.5* 7.6* 7.7* 7.6*   GFR: Estimated Creatinine Clearance: 84.9 mL/min (by C-G formula based on SCr of 0.51 mg/dL). Liver Function Tests:  Recent Labs Lab 07/13/16 0320 07/14/16 0802 07/15/16 0615 07/18/16 1246  AST 32 23 18 19   ALT 35 26 20 11   ALKPHOS 404* 435* 427* 425*  BILITOT 1.3* 1.5* 1.2 0.8  PROT 4.6* 4.7* 4.8* 5.0*  ALBUMIN 1.7* 1.7* 1.7* 2.1*   No results for input(s): LIPASE, AMYLASE in Brandon last 168 hours. No results for input(s): AMMONIA in Brandon last 168 hours. Coagulation Profile: No results for input(s): INR, PROTIME in Brandon last 168 hours. Cardiac Enzymes: No results for input(s): CKTOTAL, CKMB, CKMBINDEX, TROPONINI in Brandon last 168 hours. BNP (last 3 results) No results for input(s): PROBNP in Brandon last 8760 hours. HbA1C: No results for input(s): HGBA1C in Brandon last 72 hours. CBG: No results for input(s): GLUCAP in Brandon last 168 hours. Lipid Profile: No results for input(s): CHOL, HDL,  LDLCALC, TRIG, CHOLHDL, LDLDIRECT in Brandon last 72 hours. Thyroid Function Tests: No results for input(s): TSH, T4TOTAL, FREET4, T3FREE, THYROIDAB in Brandon last 72 hours. Anemia Panel: No results for input(s): VITAMINB12, FOLATE, FERRITIN, TIBC, IRON, RETICCTPCT in Brandon last 72 hours. Urine analysis:    Component Value Date/Time   COLORURINE YELLOW 07/10/2016 1735   APPEARANCEUR CLEAR 07/10/2016 1735   LABSPEC 1.038 (H) 07/10/2016 1735   PHURINE 6.0 07/10/2016 1735   GLUCOSEU NEGATIVE 07/10/2016 1735   GLUCOSEU NEGATIVE 02/15/2016 1135   HGBUR NEGATIVE 07/10/2016 1735   BILIRUBINUR NEGATIVE 07/10/2016 1735   BILIRUBINUR negative 07/04/2016 1053   KETONESUR NEGATIVE 07/10/2016 1735   PROTEINUR NEGATIVE 07/10/2016 1735   UROBILINOGEN 1.0 07/04/2016 1053   UROBILINOGEN 0.2 02/15/2016 1135   NITRITE NEGATIVE 07/10/2016 1735   LEUKOCYTESUR NEGATIVE 07/10/2016 1735   Sepsis Labs: @LABRCNTIP (procalcitonin:4,lacticidven:4) ) Recent Results (from Brandon past 240 hour(s))  Blood Culture (routine x 2)     Status: Abnormal   Collection Time: 07/10/16  5:35 PM  Result Value Ref Range Status   Specimen Description BLOOD LEFT ARM  Final   Special Requests   Final    BOTTLES DRAWN AEROBIC AND ANAEROBIC Blood Culture adequate volume   Culture  Setup Time   Final    GRAM POSITIVE COCCI IN CHAINS ANAEROBIC BOTTLE ONLY Organism ID to follow CRITICAL RESULT CALLED TO, READ BACK BY AND VERIFIED WITH: J. Scherrie November Pharm.D. 11:15 07/12/16 ([email or web address])    Culture (A)  Final    STREPTOCOCCUS INTERMEDIUS SUSCEPTIBILITIES PERFORMED ON PREVIOUS CULTURE WITHIN Brandon LAST 5 DAYS. Performed at [facility], [place] [address] [place], [place] [address]    Report Status 07/15/2016 FINAL  Final  Blood Culture ID Panel (Reflexed)     Status: Abnormal   Collection Time: 07/10/16  5:35 PM  Result Value Ref Range Status   Enterococcus species NOT DETECTED NOT DETECTED Final   Listeria monocytogenes NOT DETECTED NOT  DETECTED Final  Staphylococcus species NOT DETECTED NOT DETECTED Final   Staphylococcus aureus NOT DETECTED NOT DETECTED Final   Streptococcus species DETECTED (A) NOT DETECTED Final    Comment: Not Enterococcus species, Streptococcus agalactiae, Streptococcus pyogenes, Brandon Streptococcus pneumoniae. CRITICAL RESULT CALLED TO, READ BACK BY AND VERIFIED WITH: J. Scherrie November Pharm.D. 11:15 07/12/16 ([email or web address])    Streptococcus agalactiae NOT DETECTED NOT DETECTED Final   Streptococcus pneumoniae NOT DETECTED NOT DETECTED Final   Streptococcus pyogenes NOT DETECTED NOT DETECTED Final   Acinetobacter baumannii NOT DETECTED NOT DETECTED Final   Enterobacteriaceae species NOT DETECTED NOT DETECTED Final   Enterobacter cloacae complex NOT DETECTED NOT DETECTED Final   Escherichia coli NOT DETECTED NOT DETECTED Final   Klebsiella oxytoca NOT DETECTED NOT DETECTED Final   Klebsiella pneumoniae NOT DETECTED NOT DETECTED Final   Proteus species NOT DETECTED NOT DETECTED Final   Serratia marcescens NOT DETECTED NOT DETECTED Final   Haemophilus influenzae NOT DETECTED NOT DETECTED Final   Neisseria meningitidis NOT DETECTED NOT DETECTED Final   Pseudomonas aeruginosa NOT DETECTED NOT DETECTED Final   Candida albicans NOT DETECTED NOT DETECTED Final   Candida glabrata NOT DETECTED NOT DETECTED Final   Candida krusei NOT DETECTED NOT DETECTED Final   Candida parapsilosis NOT DETECTED NOT DETECTED Final   Candida tropicalis NOT DETECTED NOT DETECTED Final    Comment: Performed at [facility], [place] [address] [place], [place] [address]  Blood Culture (routine x 2)     Status: Abnormal   Collection Time: 07/10/16  5:43 PM  Result Value Ref Range Status   Specimen Description BLOOD RIGHT HAND  Final   Special Requests   Final    BOTTLES DRAWN AEROBIC AND ANAEROBIC Blood Culture adequate volume   Culture  Setup Time   Final    GRAM POSITIVE COCCI IN CHAINS ANAEROBIC BOTTLE ONLY CRITICAL VALUE NOTED.   VALUE IS CONSISTENT WITH PREVIOUSLY REPORTED AND CALLED VALUE. Performed at [facility], [place] [address], [place], [place] [address]    Culture STREPTOCOCCUS INTERMEDIUS (A)  Final   Report Status 07/15/2016 FINAL  Final   Organism ID, Bacteria STREPTOCOCCUS INTERMEDIUS  Final      Susceptibility   Streptococcus intermedius - MIC*    PENICILLIN <=0.06 SENSITIVE Sensitive     CEFTRIAXONE 0.25 SENSITIVE Sensitive     ERYTHROMYCIN >=8 RESISTANT Resistant     LEVOFLOXACIN <=0.25 SENSITIVE Sensitive     VANCOMYCIN 0.5 SENSITIVE Sensitive     * STREPTOCOCCUS INTERMEDIUS  Aerobic/Anaerobic Culture (surgical/deep wound)     Status: None   Collection Time: 07/11/16  5:22 PM  Result Value Ref Range Status   Specimen Description ABSCESS HEPATIC  Final   Special Requests NONE  Final   Gram Stain   Final    ABUNDANT WBC PRESENT, PREDOMINANTLY PMN ABUNDANT GRAM POSITIVE COCCI IN PAIRS    Culture   Final    ABUNDANT STREPTOCOCCUS INTERMEDIUS NO ANAEROBES ISOLATED Performed at [facility], [place] [address] [place], [place] [address]    Report Status 07/16/2016 FINAL  Final   Organism ID, Bacteria STREPTOCOCCUS INTERMEDIUS  Final      Susceptibility   Streptococcus intermedius - MIC*    PENICILLIN <=0.06 SENSITIVE Sensitive     CEFTRIAXONE <=0.12 SENSITIVE Sensitive     ERYTHROMYCIN >=8 RESISTANT Resistant     LEVOFLOXACIN <=0.25 SENSITIVE Sensitive     VANCOMYCIN 0.25 SENSITIVE Sensitive     * ABUNDANT STREPTOCOCCUS INTERMEDIUS  MRSA PCR  Screening     Status: None   Collection Time: 07/11/16  6:54 PM  Result Value Ref Range Status   MRSA by PCR NEGATIVE NEGATIVE Final    Comment:        Brandon GeneXpert MRSA Assay (FDA approved for NASAL specimens only), is one component of a comprehensive MRSA colonization surveillance program. It is not intended to diagnose MRSA infection nor to guide Brandon monitor treatment for MRSA infections.      Radiological Exams on Admission: Ct  Abdomen Pelvis W Contrast  Result Date: 07/19/2016 CLINICAL DATA:  History of hepatic abscess, post ultrasound guided hepatic abscess drainage catheter placement on 07/11/2016. Patient returns to [facility] hospital today complaining of worsening leakage of fluid adjacent to Brandon hepatic abscess drainage catheter. EXAM: CT ABDOMEN AND PELVIS WITH CONTRAST TECHNIQUE: Multidetector CT imaging of Brandon abdomen and pelvis was performed using Brandon standard protocol following bolus administration of intravenous contrast. CONTRAST:  100 cc Isovue 300 COMPARISON:  CT scan abdomen pelvis - 07/10/2016; 01/03/2011; ultrasound-guided hepatic abscess drainage catheter placement -07/11/2016 FINDINGS: Lower chest: Limited visualization of lower thorax demonstrates development of a small to moderate size right-sided effusion and trace left-sided effusion with associated bibasilar subsegmental atelectasis. Normal heart size. Calcifications within Brandon aortic valve leaflets. No pericardial effusion. Hepatobiliary: Normal hepatic contour. Suspected retraction of percutaneous step pack abscess drainage catheter with and coiled and locked within Brandon peripheral aspect Brandon right lobe of Brandon liver. Ill-defined hepatic abscess has minimally improved in Brandon interval, currently measuring approximately 7.5 x 4.9 x 6.9 cm (axial image 16, series 3, coronal image 55, series 6), previously, 7.4 x 6.1 x 8.2 cm, with decreased liquified component. Interval development of a small amount of perihepatic fluid/ascites. No new definable/drainable intrahepatic abscess ease. Post cholecystectomy. No definite intrahepatic bili duct dilatation. 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 Pancreas: Normal appearance of Brandon pancreas Spleen: Normal appearance of Brandon spleen Adrenals/Urinary Tract: There is symmetric enhancement of Brandon bilateral kidneys. Re- demonstrated geographic atrophy involving Brandon  inferior pole Brandon left kidney, likely a sequela of prior infection Brandon ischemic injury. Calcifications about Brandon bilateral renal hilar favored to be vascular in etiology. No definite renal stones this postcontrast examination. No urine obstruction. Minimal thickening involving Brandon crux of Brandon left adrenal gland, too small to adequately characterize. Normal appearance of Brandon right adrenal gland. Normal appearance of Brandon urinary bladder given degree distention. Stomach/Bowel: Rather extensive colonic diverticulosis without evidence of superimposed acute tear diverticulitis. Moderate colonic stool burden without evidence of enteric obstruction. Normal appearance of Brandon terminal ileum and retrocecal appendix. No pneumoperitoneum, pneumatosis Brandon portal venous gas. Vascular/Lymphatic: Moderate to large amount of eccentric mixed calcified and noncalcified atherosclerotic plaque throughout Brandon abdominal aorta. Unchanged aneurysmal dilatation involving Brandon mid aspect of Brandon abdominal aorta measuring approximately 3.8 x 4.1 x 3.6 cm as measured in greatest oblique short axis axial (image 39, series 3), coronal (image 51, series 6) and sagittal (image 67, series 7) dimensions respectively. Reproductive: Borderline enlarged prostate gland. Smaller fluid seen within Brandon lower pelvis and pelvic cul-de-sac. Other: Diffuse body wall anasarca. Small bilateral mesenteric fat containing periumbilical hernias. Musculoskeletal: No acute Brandon aggressive osseous abnormalities. Post L4-L5 intervertebral disc space replacement without definite evidence of hardware failure loosening. 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 IMPRESSION: 1. Suspected retraction of hepatic percutaneous drainage catheter with end coiled and locked within Brandon peripheral aspect of Brandon  residual ill-defined hepatic abscess. Brandon hepatic abscess appears to have decreased in size Brandon interval, currently measuring 7.5 x 4.9 x 6.9 cm, previously, 7.4  x 6.1 x 8.2 cm, with decreased liquified component. 2. Interval development of small to moderate size right-sided effusion and small amount of intra-abdominal ascites. 3. Otherwise, no new definable/drainable fluid collections within Brandon liver, abdomen Brandon pelvis. 4. 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 5. Extensive colonic diverticulosis without evidence of diverticulitis. 6. Grossly unchanged size of approximately 4.1 cm abdominal aortic aneurysm. Recommend followup by ultrasound in 1 year. This recommendation follows ACR consensus guidelines: White Paper of Brandon ACR Incidental Findings Committee II on Vascular Findings. J Am Coll Radiol 2013; 10:789-794. 7. Aortic Atherosclerosis (ICD10-I70.0). Aortic aneurysm NOS (ICD10-I71.9). Electronically Signed   By: Sandi Mariscal M.D.   On: 07/19/2016 15:10    EKG: Independently reviewed.   Assessment/Plan Principal Problem:   Pleural effusion Active Problems:   Hyperlipidemia   CAD, NATIVE VESSEL   Hypertension   Liver abscess    Pleural Effusion:  Pt had thoracentesis removing 650 cc of fluid, Pt will receive Iv antibiotics. Culture pending  Liver abscess: Pt has drain in place, IR is planning to manipulate to try to obtain improved placement tomorrow.  Iv Rocephin 2 grams.  Infectious disease consultation  Coronary Artery Disease:  Continue home medications,   Hypertension: Continue home medications   Hyperlipidemia: continue home medications   Bacteremia due to Streptococcus.  Pt continues on Rocephin IV.   DVT prophylaxis: SCD's Code Status: Full Brandon Communication: Wife present with patient Disposition Plan: 07/21/2016 Consults called: IR following Saverio [name] Infectious Disease consult  Dr. Talbot Grumbling Admission status: Inpatient   Alyse Low MD [facility] Pager [phone]  If 7PM-7AM, please contact night-coverage [email or web address] Password [facility]  07/19/2016, 4:19 PM

## 2016-07-20 ENCOUNTER — Inpatient Hospital Stay (HOSPITAL_COMMUNITY): Payer: Medicare Other

## 2016-07-20 ENCOUNTER — Other Ambulatory Visit (HOSPITAL_COMMUNITY): Payer: Medicare Other

## 2016-07-20 ENCOUNTER — Encounter (HOSPITAL_COMMUNITY): Payer: Self-pay

## 2016-07-20 DIAGNOSIS — Z8673 Personal history of transient ischemic attack (TIA), and cerebral infarction without residual deficits: Secondary | ICD-10-CM

## 2016-07-20 DIAGNOSIS — Z820 Family history of epilepsy and other diseases of the nervous system: Secondary | ICD-10-CM

## 2016-07-20 DIAGNOSIS — Z800 Family history of malignant neoplasm of digestive organs: Secondary | ICD-10-CM

## 2016-07-20 DIAGNOSIS — Z87891 Personal history of nicotine dependence: Secondary | ICD-10-CM

## 2016-07-20 DIAGNOSIS — I2510 Atherosclerotic heart disease of native coronary artery without angina pectoris: Secondary | ICD-10-CM

## 2016-07-20 DIAGNOSIS — Z8261 Family history of arthritis: Secondary | ICD-10-CM

## 2016-07-20 DIAGNOSIS — Z886 Allergy status to analgesic agent status: Secondary | ICD-10-CM

## 2016-07-20 DIAGNOSIS — Z8249 Family history of ischemic heart disease and other diseases of the circulatory system: Secondary | ICD-10-CM

## 2016-07-20 DIAGNOSIS — J90 Pleural effusion, not elsewhere classified: Principal | ICD-10-CM

## 2016-07-20 DIAGNOSIS — B955 Unspecified streptococcus as the cause of diseases classified elsewhere: Secondary | ICD-10-CM

## 2016-07-20 DIAGNOSIS — R7881 Bacteremia: Secondary | ICD-10-CM

## 2016-07-20 DIAGNOSIS — Z95828 Presence of other vascular implants and grafts: Secondary | ICD-10-CM

## 2016-07-20 DIAGNOSIS — Z808 Family history of malignant neoplasm of other organs or systems: Secondary | ICD-10-CM

## 2016-07-20 DIAGNOSIS — Z8349 Family history of other endocrine, nutritional and metabolic diseases: Secondary | ICD-10-CM

## 2016-07-20 DIAGNOSIS — I10 Essential (primary) hypertension: Secondary | ICD-10-CM

## 2016-07-20 DIAGNOSIS — K750 Abscess of liver: Secondary | ICD-10-CM

## 2016-07-20 LAB — GLUCOSE, PLEURAL OR PERITONEAL FLUID: GLUCOSE FL: 113 mg/dL

## 2016-07-20 LAB — PROTEIN, PLEURAL OR PERITONEAL FLUID

## 2016-07-20 LAB — FERRITIN: Ferritin: 307 ng/mL (ref 24–336)

## 2016-07-20 LAB — IRON AND TIBC
Iron: 10 ug/dL — ABNORMAL LOW (ref 45–182)
Saturation Ratios: 7 % — ABNORMAL LOW (ref 17.9–39.5)
TIBC: 143 ug/dL — ABNORMAL LOW (ref 250–450)
UIBC: 133 ug/dL

## 2016-07-20 LAB — CBC
HEMATOCRIT: 25.9 % — AB (ref 39.0–52.0)
HEMOGLOBIN: 8.1 g/dL — AB (ref 13.0–17.0)
MCH: 26.9 pg (ref 26.0–34.0)
MCHC: 31.3 g/dL (ref 30.0–36.0)
MCV: 86 fL (ref 78.0–100.0)
Platelets: 227 10*3/uL (ref 150–400)
RBC: 3.01 MIL/uL — AB (ref 4.22–5.81)
RDW: 16.7 % — ABNORMAL HIGH (ref 11.5–15.5)
WBC: 6 10*3/uL (ref 4.0–10.5)

## 2016-07-20 LAB — PATHOLOGIST SMEAR REVIEW

## 2016-07-20 LAB — BASIC METABOLIC PANEL
ANION GAP: 7 (ref 5–15)
BUN: <5 mg/dL — ABNORMAL LOW (ref 6–20)
CO2: 26 mmol/L (ref 22–32)
Calcium: 7.5 mg/dL — ABNORMAL LOW (ref 8.9–10.3)
Chloride: 101 mmol/L (ref 101–111)
Creatinine, Ser: 0.61 mg/dL (ref 0.61–1.24)
GLUCOSE: 100 mg/dL — AB (ref 65–99)
POTASSIUM: 3.5 mmol/L (ref 3.5–5.1)
Sodium: 134 mmol/L — ABNORMAL LOW (ref 135–145)

## 2016-07-20 LAB — LACTATE DEHYDROGENASE: LDH: 161 U/L (ref 98–192)

## 2016-07-20 LAB — LACTATE DEHYDROGENASE, PLEURAL OR PERITONEAL FLUID: LD FL: 149 U/L — AB (ref 3–23)

## 2016-07-20 LAB — FOLATE: FOLATE: 9 ng/mL (ref >5.9)

## 2016-07-20 LAB — VITAMIN B12: Vitamin B-12: 3488 pg/mL — ABNORMAL HIGH (ref 180–914)

## 2016-07-20 MED ORDER — PANTOPRAZOLE SODIUM 40 MG PO TBEC
40.0000 mg | DELAYED_RELEASE_TABLET | Freq: Every day | ORAL | Status: DC
  Administered 2016-07-21: 40 mg via ORAL
  Filled 2016-07-20: qty 1

## 2016-07-20 MED ORDER — LIDOCAINE HCL (PF) 1 % IJ SOLN
INTRAMUSCULAR | Status: AC
  Filled 2016-07-20: qty 30

## 2016-07-20 MED ORDER — SODIUM CHLORIDE 0.9 % IV SOLN
510.0000 mg | Freq: Once | INTRAVENOUS | Status: AC
  Administered 2016-07-20: 510 mg via INTRAVENOUS
  Filled 2016-07-20: qty 17

## 2016-07-20 MED ORDER — METOPROLOL TARTRATE 12.5 MG HALF TABLET
12.5000 mg | ORAL_TABLET | Freq: Two times a day (BID) | ORAL | Status: DC
  Administered 2016-07-20 – 2016-07-21 (×2): 12.5 mg via ORAL
  Filled 2016-07-20 (×2): qty 1

## 2016-07-20 MED ORDER — SENNOSIDES-DOCUSATE SODIUM 8.6-50 MG PO TABS
1.0000 | ORAL_TABLET | Freq: Two times a day (BID) | ORAL | Status: DC
  Administered 2016-07-20 – 2016-07-21 (×3): 1 via ORAL
  Filled 2016-07-20 (×3): qty 1

## 2016-07-20 MED ORDER — MIDAZOLAM HCL 2 MG/2ML IJ SOLN
INTRAMUSCULAR | Status: AC | PRN
  Administered 2016-07-20 (×2): 1 mg via INTRAVENOUS

## 2016-07-20 MED ORDER — FENTANYL CITRATE (PF) 100 MCG/2ML IJ SOLN
INTRAMUSCULAR | Status: AC
  Filled 2016-07-20: qty 2

## 2016-07-20 MED ORDER — OXYCODONE HCL 5 MG PO TABS
5.0000 mg | ORAL_TABLET | ORAL | Status: DC | PRN
  Administered 2016-07-20 – 2016-07-21 (×7): 10 mg via ORAL
  Filled 2016-07-20 (×7): qty 2

## 2016-07-20 MED ORDER — FENTANYL CITRATE (PF) 100 MCG/2ML IJ SOLN
INTRAMUSCULAR | Status: AC | PRN
  Administered 2016-07-20 (×2): 50 ug via INTRAVENOUS

## 2016-07-20 MED ORDER — MIDAZOLAM HCL 2 MG/2ML IJ SOLN
INTRAMUSCULAR | Status: AC
  Filled 2016-07-20: qty 2

## 2016-07-20 MED ORDER — POTASSIUM CHLORIDE CRYS ER 20 MEQ PO TBCR
40.0000 meq | EXTENDED_RELEASE_TABLET | Freq: Once | ORAL | Status: AC
  Administered 2016-07-20: 40 meq via ORAL
  Filled 2016-07-20: qty 2

## 2016-07-20 MED ORDER — OXYCODONE HCL 5 MG PO TABS
5.0000 mg | ORAL_TABLET | Freq: Once | ORAL | Status: AC
  Administered 2016-07-20: 5 mg via ORAL
  Filled 2016-07-20: qty 1

## 2016-07-20 NOTE — Sedation Documentation (Signed)
Patient is resting comfortably. 

## 2016-07-20 NOTE — Progress Notes (Addendum)
PROGRESS NOTE    Brandon [name]  TIW:[id number] DOB: 1941-01-13 DOA: 07/19/2016 PCP: Brunetta Jeans, PA-C    Brief Narrative:  Patient is a 76 year old gentleman history of coronary artery disease, TIA, hypertension, recent hospitalization for strep intermedius bacteremia found to have a large liver abscess seen by IR on 07/19/2016 for evaluation for drainage around hepatic strain in the new pleural effusion. Patient underwent thoracentesis. Patient admitted for drain replacement. Patient also noted to be on IV Rocephin. ID consulted.   Assessment & Plan:   Principal Problem:   Pleural effusion Active Problems:   Hyperlipidemia   CAD, NATIVE VESSEL   Hypertension   Liver abscess   Bacteremia due to Streptococcus  #1 right-sided pleural effusion Questionable etiology. Concerned that may likely be secondary to right-sided liver abscess. Patient status post diagnostic and therapeutic thoracentesis with Gram stain showing no organisms however abundant WBC present. Fluid culture pending. Patient currently afebrile. By light's criteria and exudate. Patient currently with recently diagnosed liver abscess and septic coccus bacteremia on IV Rocephin. Continue IV Rocephin. Likely outpatient follow-up.  #2 liver abscess Patient noted to have a liver abscess during last hospitalization in drain was placed. Patient presenting to IR on admission her manipulation of drain placement. Patient status post ultrasound guided liver drainage with placement of a 12 F drain per Dr. Earleen Newport 07/20/2016. Procedure without any complications. Continue IV Rocephin. Outpatient follow-up. Pain management. ID consulted and following.  #3 bacteremia due to Streptococcus Patient currently afebrile. Patient with a normal white count. Continue empiric IV Rocephin. Will likely need outpatient follow-up with ID. ID has been consulted and following.  #4 anemia Anemia panel consistent with iron deficiency anemia. Will give a  dose of IV Feraheme. Follow H&H. Transfusion threshold hemoglobin less than 7.  #5 hypertension Stable. Continue Norvasc.  #6 hyperlipidemia Continue statin and fish oil.  #7 coronary artery disease Stable. Continue home regimen of Norvasc, statin, imdur.   DVT prophylaxis: SCDs Code Status: Full Family Communication: Updated patient. No family at bedside. Disposition Plan: Hopefully tomorrow if no further complications with drain placement.   Consultants:   ID pending  Procedures:   Ultrasound-guided diagnostic and therapeutic thoracentesis 07/19/2016 per Saverio Danker, PA IR  Ultrasound-guided liver drainage with placement of a 12 F drain per Dr. Earleen Newport 07/20/2016  Antimicrobials:   IV Rocephin start date prior to hospitalization/last admission. End date 08/01/2016   Subjective: Patient states pain improved with changing pain medication dose. No chest pain. No shortness of breath. No nausea. No vomiting.  Objective: Vitals:   07/20/16 1156 07/20/16 1201 07/20/16 1208 07/20/16 1227  BP: 117/75 122/80 129/81 136/77  Pulse: 95 97 96 (!) 103  Resp: 18 17 (!) 21 20  Temp:    99 F (37.2 C)  TempSrc:    Oral  SpO2: 97% 96% 96% 95%  Weight:      Height:        Intake/Output Summary (Last 24 hours) at 07/20/16 1325 Last data filed at 07/20/16 0830  Gross per 24 hour  Intake            882.5 ml  Output                0 ml  Net            882.5 ml   Filed Weights   07/19/16 1714  Weight: 82.2 kg (181 lb 3.2 oz)    Examination:  General exam: Appears calm and comfortable  Respiratory system: Clear to auscultation. Respiratory effort normal. Cardiovascular system: S1 & S2 heard, RRR with 3/6 SEM RUSB, LUSB. No JVD, rubs, gallops or clicks. No pedal edema. Gastrointestinal system: Abdomen is nondistended, soft and nontender. No organomegaly or masses felt. Normal bowel sounds heard. Drain in place. Central nervous system: Alert and oriented. No focal  neurological deficits. Extremities: Symmetric 5 x 5 power. Skin: No rashes, lesions or ulcers Psychiatry: Judgement and insight appear normal. Mood & affect appropriate.     Data Reviewed: I have personally reviewed following labs and imaging studies  CBC:  Recent Labs Lab 07/18/16 1246 07/19/16 1818 07/20/16 0310  WBC 6.7 6.4 6.0  NEUTROABS  --  4.6  --   HGB 9.4* 9.1* 8.1*  HCT 29.4* 28.9* 25.9*  MCV 85.1 85.5 86.0  PLT 233.0 252 016   Basic Metabolic Panel:  Recent Labs Lab 07/14/16 0802 07/15/16 0615 07/18/16 1246 07/19/16 1818 07/20/16 0310  NA 140 140 139 136 134*  K 3.5 3.6 4.0 3.5 3.5  CL 108 105 104 102 101  CO2 24 26 31 26 26   GLUCOSE 96 101* 115* 107* 100*  BUN 16 16 9  5* <5*  CREATININE 0.58* 0.65 0.51 0.54* 0.61  CALCIUM 7.6* 7.7* 7.6* 7.5* 7.5*   GFR: Estimated Creatinine Clearance: 84.9 mL/min (by C-G formula based on SCr of 0.61 mg/dL). Liver Function Tests:  Recent Labs Lab 07/14/16 0802 07/15/16 0615 07/18/16 1246 07/19/16 1818  AST 23 18 19 24   ALT 26 20 11  15*  ALKPHOS 435* 427* 425* 359*  BILITOT 1.5* 1.2 0.8 0.9  PROT 4.7* 4.8* 5.0* 5.6*  ALBUMIN 1.7* 1.7* 2.1* 1.8*   No results for input(s): LIPASE, AMYLASE in the last 168 hours. No results for input(s): AMMONIA in the last 168 hours. Coagulation Profile:  Recent Labs Lab 07/19/16 1818  INR 1.29   Cardiac Enzymes: No results for input(s): CKTOTAL, CKMB, CKMBINDEX, TROPONINI in the last 168 hours. BNP (last 3 results) No results for input(s): PROBNP in the last 8760 hours. HbA1C: No results for input(s): HGBA1C in the last 72 hours. CBG: No results for input(s): GLUCAP in the last 168 hours. Lipid Profile: No results for input(s): CHOL, HDL, LDLCALC, TRIG, CHOLHDL, LDLDIRECT in the last 72 hours. Thyroid Function Tests: No results for input(s): TSH, T4TOTAL, FREET4, T3FREE, THYROIDAB in the last 72 hours. Anemia Panel:  Recent Labs  07/20/16 0842  VITAMINB12  3,488*  FOLATE 9.0  FERRITIN 307  TIBC 143*  IRON 10*   Sepsis Labs: No results for input(s): PROCALCITON, LATICACIDVEN in the last 168 hours.  Recent Results (from the past 240 hour(s))  Blood Culture (routine x 2)     Status: Abnormal   Collection Time: 07/10/16  5:35 PM  Result Value Ref Range Status   Specimen Description BLOOD LEFT ARM  Final   Special Requests   Final    BOTTLES DRAWN AEROBIC AND ANAEROBIC Blood Culture adequate volume   Culture  Setup Time   Final    GRAM POSITIVE COCCI IN CHAINS ANAEROBIC BOTTLE ONLY Organism ID to follow CRITICAL RESULT CALLED TO, READ BACK BY AND VERIFIED WITH: J. Scherrie November Pharm.D. 11:15 07/12/16 ([email or web address])    Culture (A)  Final    STREPTOCOCCUS INTERMEDIUS SUSCEPTIBILITIES PERFORMED ON PREVIOUS CULTURE WITHIN THE LAST 5 DAYS. Performed at [facility], [place] [address] [place], [place] [address]    Report Status 07/15/2016 FINAL  Final  Blood Culture ID Panel (Reflexed)  Status: Abnormal   Collection Time: 07/10/16  5:35 PM  Result Value Ref Range Status   Enterococcus species NOT DETECTED NOT DETECTED Final   Listeria monocytogenes NOT DETECTED NOT DETECTED Final   Staphylococcus species NOT DETECTED NOT DETECTED Final   Staphylococcus aureus NOT DETECTED NOT DETECTED Final   Streptococcus species DETECTED (A) NOT DETECTED Final    Comment: Not Enterococcus species, Streptococcus agalactiae, Streptococcus pyogenes, or Streptococcus pneumoniae. CRITICAL RESULT CALLED TO, READ BACK BY AND VERIFIED WITH: J. Scherrie November Pharm.D. 11:15 07/12/16 ([email or web address])    Streptococcus agalactiae NOT DETECTED NOT DETECTED Final   Streptococcus pneumoniae NOT DETECTED NOT DETECTED Final   Streptococcus pyogenes NOT DETECTED NOT DETECTED Final   Acinetobacter baumannii NOT DETECTED NOT DETECTED Final   Enterobacteriaceae species NOT DETECTED NOT DETECTED Final   Enterobacter cloacae complex NOT DETECTED NOT DETECTED Final   Escherichia coli NOT  DETECTED NOT DETECTED Final   Klebsiella oxytoca NOT DETECTED NOT DETECTED Final   Klebsiella pneumoniae NOT DETECTED NOT DETECTED Final   Proteus species NOT DETECTED NOT DETECTED Final   Serratia marcescens NOT DETECTED NOT DETECTED Final   Haemophilus influenzae NOT DETECTED NOT DETECTED Final   Neisseria meningitidis NOT DETECTED NOT DETECTED Final   Pseudomonas aeruginosa NOT DETECTED NOT DETECTED Final   Candida albicans NOT DETECTED NOT DETECTED Final   Candida glabrata NOT DETECTED NOT DETECTED Final   Candida krusei NOT DETECTED NOT DETECTED Final   Candida parapsilosis NOT DETECTED NOT DETECTED Final   Candida tropicalis NOT DETECTED NOT DETECTED Final    Comment: Performed at [facility], [place] [address], [place], [place] [address]  Blood Culture (routine x 2)     Status: Abnormal   Collection Time: 07/10/16  5:43 PM  Result Value Ref Range Status   Specimen Description BLOOD RIGHT HAND  Final   Special Requests   Final    BOTTLES DRAWN AEROBIC AND ANAEROBIC Blood Culture adequate volume   Culture  Setup Time   Final    GRAM POSITIVE COCCI IN CHAINS ANAEROBIC BOTTLE ONLY CRITICAL VALUE NOTED.  VALUE IS CONSISTENT WITH PREVIOUSLY REPORTED AND CALLED VALUE. Performed at [facility], [place] [address] [place], [place] [address]    Culture STREPTOCOCCUS INTERMEDIUS (A)  Final   Report Status 07/15/2016 FINAL  Final   Organism ID, Bacteria STREPTOCOCCUS INTERMEDIUS  Final      Susceptibility   Streptococcus intermedius - MIC*    PENICILLIN <=0.06 SENSITIVE Sensitive     CEFTRIAXONE 0.25 SENSITIVE Sensitive     ERYTHROMYCIN >=8 RESISTANT Resistant     LEVOFLOXACIN <=0.25 SENSITIVE Sensitive     VANCOMYCIN 0.5 SENSITIVE Sensitive     * STREPTOCOCCUS INTERMEDIUS  Aerobic/Anaerobic Culture (surgical/deep wound)     Status: None   Collection Time: 07/11/16  5:22 PM  Result Value Ref Range Status   Specimen Description ABSCESS HEPATIC  Final   Special Requests NONE   Final   Gram Stain   Final    ABUNDANT WBC PRESENT, PREDOMINANTLY PMN ABUNDANT GRAM POSITIVE COCCI IN PAIRS    Culture   Final    ABUNDANT STREPTOCOCCUS INTERMEDIUS NO ANAEROBES ISOLATED Performed at [facility], [place] [address], [place], [place] [address]    Report Status 07/16/2016 FINAL  Final   Organism ID, Bacteria STREPTOCOCCUS INTERMEDIUS  Final      Susceptibility   Streptococcus intermedius - MIC*    PENICILLIN <=0.06 SENSITIVE Sensitive     CEFTRIAXONE <=0.12  SENSITIVE Sensitive     ERYTHROMYCIN >=8 RESISTANT Resistant     LEVOFLOXACIN <=0.25 SENSITIVE Sensitive     VANCOMYCIN 0.25 SENSITIVE Sensitive     * ABUNDANT STREPTOCOCCUS INTERMEDIUS  MRSA PCR Screening     Status: None   Collection Time: 07/11/16  6:54 PM  Result Value Ref Range Status   MRSA by PCR NEGATIVE NEGATIVE Final    Comment:        The GeneXpert MRSA Assay (FDA approved for NASAL specimens only), is one component of a comprehensive MRSA colonization surveillance program. It is not intended to diagnose MRSA infection nor to guide or monitor treatment for MRSA infections.   Culture, body fluid-bottle     Status: None (Preliminary result)   Collection Time: 07/19/16  4:24 PM  Result Value Ref Range Status   Specimen Description PLEURAL  Final   Special Requests RIGHT PLEURAL  Final   Culture NO GROWTH < 24 HOURS  Final   Report Status PENDING  Incomplete  Gram stain     Status: None   Collection Time: 07/19/16  4:24 PM  Result Value Ref Range Status   Specimen Description PLEURAL  Final   Special Requests RIGHT PLEURAL  Final   Gram Stain   Final    ABUNDANT WBC PRESENT, PREDOMINANTLY PMN NO ORGANISMS SEEN    Report Status 07/19/2016 FINAL  Final  Culture, blood (routine x 2)     Status: None (Preliminary result)   Collection Time: 07/19/16  6:18 PM  Result Value Ref Range Status   Specimen Description BLOOD LEFT ANTECUBITAL  Final   Special Requests   Final    BOTTLES DRAWN  AEROBIC AND ANAEROBIC Blood Culture adequate volume   Culture NO GROWTH < 24 HOURS  Final   Report Status PENDING  Incomplete  Culture, blood (routine x 2)     Status: None (Preliminary result)   Collection Time: 07/19/16  6:18 PM  Result Value Ref Range Status   Specimen Description BLOOD RIGHT HAND  Final   Special Requests   Final    BOTTLES DRAWN AEROBIC AND ANAEROBIC Blood Culture adequate volume   Culture NO GROWTH < 24 HOURS  Final   Report Status PENDING  Incomplete         Radiology Studies: Dg Chest 1 View  Result Date: 07/19/2016 CLINICAL DATA:  Status post right-sided thoracentesis for pleural effusion. EXAM: CHEST 1 VIEW COMPARISON:  Chest x-ray of July 11 2016 and CT scan of the abdomen and pelvis of July 19, 2016 FINDINGS: A pigtail drainage catheter is present in the right lateral aspect of the liver. The lungs are adequately inflated. There is no focal infiltrate. There is no pleural effusion. The heart and pulmonary vascularity are normal. There is calcification in the wall of the aortic arch. The PICC line tip projects over the distal third of the SVC. IMPRESSION: There is no pneumothorax or pleural effusion nor other acute cardiopulmonary disease. Electronically Signed   By: David  Martinique M.D.   On: 07/19/2016 16:42   Ct Abdomen Pelvis W Contrast  Result Date: 07/19/2016 CLINICAL DATA:  History of hepatic abscess, post ultrasound guided hepatic abscess drainage catheter placement on 07/11/2016. Patient returns to the hospital today complaining of worsening leakage of fluid adjacent to the hepatic abscess drainage catheter. EXAM: CT ABDOMEN AND PELVIS WITH CONTRAST TECHNIQUE: Multidetector CT imaging of the abdomen and pelvis was performed using the standard protocol following bolus administration of intravenous contrast. CONTRAST:  100 cc Isovue 300 COMPARISON:  CT scan abdomen pelvis - 07/10/2016; 01/03/2011; ultrasound-guided hepatic abscess drainage catheter placement  -07/11/2016 FINDINGS: Lower chest: Limited visualization of lower thorax demonstrates development of a small to moderate size right-sided effusion and trace left-sided effusion with associated bibasilar subsegmental atelectasis. Normal heart size. Calcifications within the aortic valve leaflets. No pericardial effusion. Hepatobiliary: Normal hepatic contour. Suspected retraction of percutaneous step pack abscess drainage catheter with and coiled and locked within the peripheral aspect the right lobe of the liver. Ill-defined hepatic abscess has minimally improved in the interval, currently measuring approximately 7.5 x 4.9 x 6.9 cm (axial image 16, series 3, coronal image 55, series 6), previously, 7.4 x 6.1 x 8.2 cm, with decreased liquified component. Interval development of a small amount of perihepatic fluid/ascites. No new definable/drainable intrahepatic abscess ease. Post cholecystectomy. No definite intrahepatic bili duct dilatation. 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 Pancreas: Normal appearance of the pancreas Spleen: Normal appearance of the spleen Adrenals/Urinary Tract: There is symmetric enhancement of the bilateral kidneys. Re- demonstrated geographic atrophy involving the inferior pole the left kidney, likely a sequela of prior infection or ischemic injury. Calcifications about the bilateral renal hilar favored to be vascular in etiology. No definite renal stones this postcontrast examination. No urine obstruction. Minimal thickening involving the crux of the left adrenal gland, too small to adequately characterize. Normal appearance of the right adrenal gland. Normal appearance of the urinary bladder given degree distention. Stomach/Bowel: Rather extensive colonic diverticulosis without evidence of superimposed acute tear diverticulitis. Moderate colonic stool burden without evidence of enteric obstruction. Normal appearance  of the terminal ileum and retrocecal appendix. No pneumoperitoneum, pneumatosis or portal venous gas. Vascular/Lymphatic: Moderate to large amount of eccentric mixed calcified and noncalcified atherosclerotic plaque throughout the abdominal aorta. Unchanged aneurysmal dilatation involving the mid aspect of the abdominal aorta measuring approximately 3.8 x 4.1 x 3.6 cm as measured in greatest oblique short axis axial (image 39, series 3), coronal (image 51, series 6) and sagittal (image 67, series 7) dimensions respectively. Reproductive: Borderline enlarged prostate gland. Smaller fluid seen within the lower pelvis and pelvic cul-de-sac. Other: Diffuse body wall anasarca. Small bilateral mesenteric fat containing periumbilical hernias. Musculoskeletal: No acute or aggressive osseous abnormalities. Post L4-L5 intervertebral disc space replacement without definite evidence of hardware failure loosening. Stigmata of DISH within the caudal aspect of the thoracic spine. IMPRESSION: 1. Suspected retraction of hepatic percutaneous drainage catheter with end coiled and locked within the peripheral aspect of the residual ill-defined hepatic abscess. The hepatic abscess appears to have decreased in size the interval, currently measuring 7.5 x 4.9 x 6.9 cm, previously, 7.4 x 6.1 x 8.2 cm, with decreased liquified component. 2. Interval development of small to moderate size right-sided effusion and small amount of intra-abdominal ascites. 3. Otherwise, no new definable/drainable fluid collections within the liver, abdomen or pelvis. 4. 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 5. Extensive colonic diverticulosis without evidence of diverticulitis. 6. Grossly unchanged size of approximately 4.1 cm abdominal aortic aneurysm. Recommend followup by ultrasound in 1 year. This recommendation follows ACR consensus guidelines: White Paper of the ACR Incidental Findings Committee II on Vascular  Findings. J Am Coll Radiol 2013; 10:789-794. 7. Aortic Atherosclerosis (ICD10-I70.0). Aortic aneurysm NOS (ICD10-I71.9). Electronically Signed   By: Sandi Mariscal M.D.   On: 07/19/2016 15:10   [place] Image Guided Fluid Drain By Catheter  Result Date: 07/20/2016 INDICATION: 76 year old  male with a history of hepatic abscess EXAM: ULTRASOUND GUIDED DRAIN PLACEMENT BEDSIDE REMOVAL OF PRIOR DRAIN WHICH HAD BEEN WITHDRAWN MEDICATIONS: The patient is currently admitted to the hospital and receiving intravenous antibiotics. The antibiotics were administered within an appropriate time frame prior to the initiation of the procedure. ANESTHESIA/SEDATION: Fentanyl 100 mcg IV; Versed 2.0 mg IV Moderate Sedation Time:  15 minutes The patient was continuously monitored during the procedure by the interventional radiology nurse under my direct supervision. COMPLICATIONS: None PROCEDURE: Informed written consent was obtained from the patient after a thorough discussion of the procedural risks, benefits and alternatives. All questions were addressed. Maximal Sterile Barrier Technique was utilized including caps, mask, sterile gowns, sterile gloves, sterile drape, hand hygiene and skin antiseptic. A timeout was performed prior to the initiation of the procedure. Patient positioned left decubitus and scout images of the abdomen performed. The right flank and the indwelling drain were prepped and draped in the usual sterile fashion. The skin and subcutaneous tissues were generously infiltrated 1% lidocaine for local anesthesia. A small incision was made with 11 blade scalpel and soft tissues were dilated with 8 hemostat clamp. Under ultrasound guidance, a 12 French drain was advanced into hepatic abscess with trocar technique. Drain was formed within the abscess of the right hepatic lobe, and a final image was stored demonstrating the drain within the complex fluid/ tissue. Drain was attached to to a bulb drainage, an the prior drain was  removed given that it had been partially withdrawn. Sterile bandages were placed. Patient tolerated the procedure well and remained hemodynamically stable throughout. No complications were encountered and no significant blood loss. IMPRESSION: Status post ultrasound-guided drain placement into right hepatic abscess with placement of a 12 French drain. Bedside removal partially withdrawn prior drain. Signed, Dulcy Fanny. Earleen Newport, DO [facility] Radiology Electronically Signed   By: Corrie Mckusick D.O.   On: 07/20/2016 13:04   Ir Thoracentesis Asp Pleural Space W/img Guide  Result Date: 07/19/2016 INDICATION: Recent history of a liver abscess status post percutaneous drainage. Now with serosanguineous drainage around his abdominal drain with CT imaging showing new right pleural effusion. Request for diagnostic and therapeutic thoracentesis. EXAM: ULTRASOUND GUIDED DIAGNOSTIC AND THERAPEUTIC THORACENTESIS MEDICATIONS: 1% lidocaine COMPLICATIONS: None immediate. PROCEDURE: An ultrasound guided thoracentesis was thoroughly discussed with the patient and questions answered. The benefits, risks, alternatives and complications were also discussed. The patient understands and wishes to proceed with the procedure. Written consent was obtained. Ultrasound was performed to localize and mark an adequate pocket of fluid in the right chest. The area was then prepped and draped in the normal sterile fashion. 1% Lidocaine was used for local anesthesia. Under ultrasound guidance a Safe-T-Centesis catheter was introduced. Thoracentesis was performed. The catheter was removed and a dressing applied. FINDINGS: A total of approximately 0.65 L of slightly cloudy serosanguineous fluid was removed. Samples were sent to the laboratory as requested by the clinical team. IMPRESSION: Successful ultrasound guided right thoracentesis yielding 0.65 L of pleural fluid. Read by: Saverio Danker, PA-C  Electronically Signed   By: Sandi Mariscal M.D.   On: 07/19/2016 16:31        Scheduled Meds: . amLODipine  5 mg Oral Daily  . atorvastatin  40 mg Oral Daily  . cholecalciferol  1,000 Units Oral Daily  . fentaNYL      . isosorbide mononitrate  30 mg Oral QHS  . lidocaine (PF)      . midazolam      .  omega-3 acid ethyl esters  1 g Oral BID  . sodium chloride flush  3 mL Intravenous Q12H   Continuous Infusions: . sodium chloride 50 mL/hr at 07/19/16 1815  . cefTRIAXone (ROCEPHIN)  IV Stopped (07/19/16 1816)     LOS: 1 day    Time spent: 20 minutes    Deryn Massengale, MD [facility] Pager [phone]  If 7PM-7AM, please contact night-coverage [email or web address] Password [email or web address] 07/20/2016, 1:25 PM

## 2016-07-20 NOTE — Telephone Encounter (Signed)
Verbal orders granted. However, patient is currently re-admitted to the hospital so there will be a delay in starting this PT.

## 2016-07-20 NOTE — Progress Notes (Signed)
PHARMACY CONSULT NOTE FOR:  OUTPATIENT  PARENTERAL ANTIBIOTIC THERAPY (OPAT)  Indication: Strep bacteremia Regimen: Rocephin 2g IV q24h End date: 08/01/2016  IV antibiotic discharge orders are pended. To discharging provider:  please sign these orders via discharge navigator,  Select New Orders & click on the button choice - Manage This Unsigned Work.     Thank you for allowing pharmacy to be a part of this patient's care.   Tylynn Braniff S. Alford Highland, PharmD, BCPS Clinical Staff Pharmacist Pager [phone]  [place], [place] 07/20/2016, 1:55 PM

## 2016-07-20 NOTE — Telephone Encounter (Signed)
Called and informed Brandon [name]. He is letting the office know so that they will pend his therapy until he returns home.

## 2016-07-20 NOTE — Procedures (Signed)
Interventional Radiology Procedure Note  Procedure: US guided liver drainage, as the prior drain was partially withdrawn 74F drain placed. .  Complications: None Recommendations:  - To bulb suction. - Routine wound care for the prior puncture site - do not submerge   Signed,  Dulcy Fanny. Earleen Newport, DO

## 2016-07-20 NOTE — Progress Notes (Signed)
Patient ID: Brandon [name], male   DOB: 1940/11/15, 76 y.o.   MRN: [id number] Case discussed with Dr. Earleen Newport.  We will plan for manipulation vs new drain placement today for this liver abscess.  Fluid from thora pending for culture.  Gram stain shows abundant WBCs, but no organism currently.  The liver abscess grew strep.  Suspicion still remains given WBC in thora fluid that he has a connection from the liver abscess/drain to the chest.  Patient agreeable to proceed with manipulation/new drain placement today. Risks and Benefits discussed with the patient including bleeding, infection, damage to adjacent structures, bowel perforation/fistula connection, and sepsis. All of the patient's questions were answered, patient is agreeable to proceed. Consent signed and in chart.  Brandon [name] 10:23 AM 07/20/2016

## 2016-07-20 NOTE — Sedation Documentation (Signed)
Patient denies pain and is resting comfortably.  

## 2016-07-20 NOTE — Consult Note (Signed)
[facility]         Reason for Consult: Tx liver abscess, new pleural effusion    Referring Physician: Dr. Grandville Silos    HPI: Brandon [name] is a 76 y.o. man with PMHx of CAD, TIA, HTN, and recent hospitalization for strep intermedius bacteremia found to have a large liver abscess who was admitted on 6/14 after being evaluated in the IR clinic for drainage around his hepatic drain and a new pleural effusion. He underwent a right-sided thoracentesis which drained 650 cc of cloudy, serosanguinous fluid. Pleural fluid cell count >10,000 with neutrophil-predominance and gram stain/culture with no growth to date. A CT abd/pelvis showed evidence of a retracted drainage catheter and decrease in size of the abscess. Patient had a right-sided PICC line and has been receiving Ceftriaxone 2 g daily since discharge from the hospital on 6/11.   Today, patient had an US-guided liver drainage with a new 4F drain placed by IR. He reports he noticed drainage in the site around his catheter about 3 days after discharge from the hospital. He describes the drainage as clear and pinkish. He denies any fevers, chills, abdominal pain, nausea, vomiting, shortness of breath, or chest pain at home. He denies any swelling, redness, or tenderness near his PICC line.   Past Medical History:  Diagnosis Date  . Aortic stenosis    mild echocardiogram 8/09 EF 60%, showed no regional wall motion abnml, mild LVH, mod focal basal septal hypertrophy and mild dyastolic dysfunction. 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 mean gradient across aortic valve was 8 mmHG. also mild L atrial enlargement and normal RV size and function. minimal AS on LHC in 7/10.   . Arthritis    "all over" (07/19/2016)  . BPH (benign prostatic hypertrophy)   . CAD (coronary artery disease)    LCH (7/10) totally occluded proximal RCA with very robuse L to R collaterals, 50% proximal LAD  stenosis, EF 65%, medical management.    . Chicken pox   . Chronic lower back pain    s/p surgical fusion  . Diverticulosis   . Esophageal stricture   . GERD (gastroesophageal reflux disease)   . Heart murmur   . Hepatitis B 1984  . Hiatal hernia   . HLD (hyperlipidemia)   . HTN (hypertension)   . 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 Osteoarthritis   . TIA (transient ischemic attack) 1990s   hx  . Tubular adenoma of colon 2009    Allergies:  Allergies  Allergen Reactions  . Celecoxib Hives and Itching    Current antibiotics:   MEDICATIONS: . amLODipine  5 mg Oral Daily  . atorvastatin  40 mg Oral Daily  . cholecalciferol  1,000 Units Oral Daily  . isosorbide mononitrate  30 mg Oral QHS  . omega-3 acid ethyl esters  1 g Oral BID  . sodium chloride flush  3 mL Intravenous Q12H    Social History  Substance Use Topics  . Smoking status: Former Smoker    Packs/day: 1.50    Years: 50.00    Types: Cigarettes    Quit date: 06/2015  . Smokeless tobacco: Never Used     Comment: smoked less than 1 ppd for 40+ years; Using Vapor Cig  . Alcohol use 0.0 oz/week     Comment: 07/19/2016 "might have a few drinks/year"    Family History  Problem Relation Age of Onset  . Heart disease  Father 67       Living  . Coronary artery disease Father        CABG  . Alzheimer's disease Mother 64       Deceased  . Arthritis/Rheumatoid Mother   . Stomach cancer Maternal Uncle   . Brain cancer Maternal Aunt        x2  . Arthritis Brother        DJD  . Obesity Daughter        Had Bypass Sx    Review of Systems - All negative except per HPI   OBJECTIVE: Temp:  [98.2 F (36.8 C)-98.5 F (36.9 C)] 98.5 F (36.9 C) (06/15 0536) Pulse Rate:  [99-106] 106 (06/15 0536) Resp:  [16-18] 18 (06/15 0536) BP: (121-157)/(63-77) 121/63 (06/15 0536) SpO2:  [97 %-98 %] 97 % (06/15 0536) Weight:  [181 lb 3.2 oz (82.2 kg)] 181 lb 3.2 oz (82.2 kg) (06/14 1714)  General: Elderly man resting in bed,  NAD, alert and talkative HEENT: [place]/AT, EOMI, sclera anicteric, mucus membranes moist, no thrush present CV: RRR, 3/6 systolic murmur heard best at RUSB Pulm: CTA bilaterally, breaths non-labored Abd: BS+, soft, mild tenderness in RUQ, non-distended. Drain present in RUQ with no drainage from JP tube yet.  Ext: warm, 1+ peripheral edema bilaterally Neuro: alert and oriented x 3. Strength 5/5 in upper and lower extremities. Skin: No rashes present. Area around right abdominal drain does not appear erythematous, covered with clean bandage.   LABS: Results for orders placed or performed during the hospital encounter of 07/19/16 (from the past 48 hour(s))  Body fluid cell count with differential     Status: Abnormal   Collection Time: 07/19/16  4:24 PM  Result Value Ref Range   Fluid Type-FCT FLUID     Comment: RIGHT PLEURAL CORRECTED ON 06/14 AT 1629: PREVIOUSLY REPORTED AS Pleural R    Color, Fluid ORANGE (A) YELLOW   Appearance, Fluid CLOUDY (A) CLEAR   WBC, Fluid 10,250 (H) 0 - 1,000 cu mm   Neutrophil Count, Fluid 82 (H) 0 - 25 %   Lymphs, Fluid 4 %   Monocyte-Macrophage-Serous Fluid 14 (L) 50 - 90 %   Eos, Fluid 0 %   Other Cells, Fluid 0 %  Gram stain     Status: None   Collection Time: 07/19/16  4:24 PM  Result Value Ref Range   Specimen Description PLEURAL    Special Requests RIGHT PLEURAL    Gram Stain      ABUNDANT WBC PRESENT, PREDOMINANTLY PMN NO ORGANISMS SEEN    Report Status 07/19/2016 FINAL   CBC with Differential/Platelet     Status: Abnormal   Collection Time: 07/19/16  6:18 PM  Result Value Ref Range   WBC 6.4 4.0 - 10.5 K/uL   RBC 3.38 (L) 4.22 - 5.81 MIL/uL   Hemoglobin 9.1 (L) 13.0 - 17.0 g/dL   HCT 28.9 (L) 39.0 - 52.0 %   MCV 85.5 78.0 - 100.0 fL   MCH 26.9 26.0 - 34.0 pg   MCHC 31.5 30.0 - 36.0 g/dL   RDW 16.4 (H) 11.5 - 15.5 %   Platelets 252 150 - 400 K/uL   Neutrophils Relative % 71 %   Neutro Abs 4.6 1.7 - 7.7 K/uL   Lymphocytes Relative 23  %   Lymphs Abs 1.5 0.7 - 4.0 K/uL   Monocytes Relative 4 %   Monocytes Absolute 0.3 0.1 - 1.0 K/uL   Eosinophils Relative 1 %  Eosinophils Absolute 0.0 0.0 - 0.7 K/uL   Basophils Relative 1 %   Basophils Absolute 0.0 0.0 - 0.1 K/uL  Comprehensive metabolic panel     Status: Abnormal   Collection Time: 07/19/16  6:18 PM  Result Value Ref Range   Sodium 136 135 - 145 mmol/L   Potassium 3.5 3.5 - 5.1 mmol/L   Chloride 102 101 - 111 mmol/L   CO2 26 22 - 32 mmol/L   Glucose, Bld 107 (H) 65 - 99 mg/dL   BUN 5 (L) 6 - 20 mg/dL   Creatinine, Ser 0.54 (L) 0.61 - 1.24 mg/dL   Calcium 7.5 (L) 8.9 - 10.3 mg/dL   Total Protein 5.6 (L) 6.5 - 8.1 g/dL   Albumin 1.8 (L) 3.5 - 5.0 g/dL   AST 24 15 - 41 U/L   ALT 15 (L) 17 - 63 U/L   Alkaline Phosphatase 359 (H) 38 - 126 U/L   Total Bilirubin 0.9 0.3 - 1.2 mg/dL   GFR calc non Af Amer >60 >60 mL/min   GFR calc Af Amer >60 >60 mL/min    Comment: (NOTE) The eGFR has been calculated using the CKD EPI equation. This calculation has not been validated in all clinical situations. eGFR's persistently <60 mL/min signify possible Chronic Kidney Disease.    Anion gap 8 5 - 15  Protime-INR     Status: Abnormal   Collection Time: 07/19/16  6:18 PM  Result Value Ref Range   Prothrombin Time 16.1 (H) 11.4 - 15.2 seconds   INR 6.78   Basic metabolic panel     Status: Abnormal   Collection Time: 07/20/16  3:10 AM  Result Value Ref Range   Sodium 134 (L) 135 - 145 mmol/L   Potassium 3.5 3.5 - 5.1 mmol/L   Chloride 101 101 - 111 mmol/L   CO2 26 22 - 32 mmol/L   Glucose, Bld 100 (H) 65 - 99 mg/dL   BUN <5 (L) 6 - 20 mg/dL   Creatinine, Ser 0.61 0.61 - 1.24 mg/dL   Calcium 7.5 (L) 8.9 - 10.3 mg/dL   GFR calc non Af Amer >60 >60 mL/min   GFR calc Af Amer >60 >60 mL/min    Comment: (NOTE) The eGFR has been calculated using the CKD EPI equation. This calculation has not been validated in all clinical situations. eGFR's persistently <60 mL/min signify  possible Chronic Kidney Disease.    Anion gap 7 5 - 15  CBC     Status: Abnormal   Collection Time: 07/20/16  3:10 AM  Result Value Ref Range   WBC 6.0 4.0 - 10.5 K/uL   RBC 3.01 (L) 4.22 - 5.81 MIL/uL   Hemoglobin 8.1 (L) 13.0 - 17.0 g/dL   HCT 25.9 (L) 39.0 - 52.0 %   MCV 86.0 78.0 - 100.0 fL   MCH 26.9 26.0 - 34.0 pg   MCHC 31.3 30.0 - 36.0 g/dL   RDW 16.7 (H) 11.5 - 15.5 %   Platelets 227 150 - 400 K/uL  Vitamin B12     Status: Abnormal   Collection Time: 07/20/16  8:42 AM  Result Value Ref Range   Vitamin B-12 3,488 (H) 180 - 914 pg/mL    Comment: (NOTE) This assay is not validated for testing neonatal or myeloproliferative syndrome specimens for Vitamin B12 levels.   Folate     Status: None   Collection Time: 07/20/16  8:42 AM  Result Value Ref Range   Folate 9.0 >  5.9 ng/mL  Iron and TIBC     Status: Abnormal   Collection Time: 07/20/16  8:42 AM  Result Value Ref Range   Iron 10 (L) 45 - 182 ug/dL   TIBC 143 (L) 250 - 450 ug/dL   Saturation Ratios 7 (L) 17.9 - 39.5 %   UIBC 133 ug/dL  Ferritin     Status: None   Collection Time: 07/20/16  8:42 AM  Result Value Ref Range   Ferritin 307 24 - 336 ng/mL     IMAGING: Dg Chest 1 View  Result Date: 07/19/2016 CLINICAL DATA:  Status post right-sided thoracentesis for pleural effusion. EXAM: CHEST 1 VIEW COMPARISON:  Chest x-ray of July 11 2016 and CT scan of the abdomen and pelvis of July 19, 2016 FINDINGS: A pigtail drainage catheter is present in the right lateral aspect of the liver. The lungs are adequately inflated. There is no focal infiltrate. There is no pleural effusion. The heart and pulmonary vascularity are normal. There is calcification in the wall of the aortic arch. The PICC line tip projects over the distal third of the SVC. IMPRESSION: There is no pneumothorax or pleural effusion nor other acute cardiopulmonary disease. Electronically Signed   By: David  Martinique M.D.   On: 07/19/2016 16:42   Ct Abdomen  Pelvis W Contrast  Result Date: 07/19/2016 CLINICAL DATA:  History of hepatic abscess, post ultrasound guided hepatic abscess drainage catheter placement on 07/11/2016. Patient returns to the hospital today complaining of worsening leakage of fluid adjacent to the hepatic abscess drainage catheter. EXAM: CT ABDOMEN AND PELVIS WITH CONTRAST TECHNIQUE: Multidetector CT imaging of the abdomen and pelvis was performed using the standard protocol following bolus administration of intravenous contrast. CONTRAST:  100 cc Isovue 300 COMPARISON:  CT scan abdomen pelvis - 07/10/2016; 01/03/2011; ultrasound-guided hepatic abscess drainage catheter placement -07/11/2016 FINDINGS: Lower chest: Limited visualization of lower thorax demonstrates development of a small to moderate size right-sided effusion and trace left-sided effusion with associated bibasilar subsegmental atelectasis. Normal heart size. Calcifications within the aortic valve leaflets. No pericardial effusion. Hepatobiliary: Normal hepatic contour. Suspected retraction of percutaneous step pack abscess drainage catheter with and coiled and locked within the peripheral aspect the right lobe of the liver. Ill-defined hepatic abscess has minimally improved in the interval, currently measuring approximately 7.5 x 4.9 x 6.9 cm (axial image 16, series 3, coronal image 55, series 6), previously, 7.4 x 6.1 x 8.2 cm, with decreased liquified component. Interval development of a small amount of perihepatic fluid/ascites. No new definable/drainable intrahepatic abscess ease. Post cholecystectomy. No definite intrahepatic bili duct dilatation. 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 Pancreas: Normal appearance of the pancreas Spleen: Normal appearance of the spleen Adrenals/Urinary Tract: There is symmetric enhancement of the bilateral kidneys. Re- demonstrated geographic atrophy involving the inferior  pole the left kidney, likely a sequela of prior infection or ischemic injury. Calcifications about the bilateral renal hilar favored to be vascular in etiology. No definite renal stones this postcontrast examination. No urine obstruction. Minimal thickening involving the crux of the left adrenal gland, too small to adequately characterize. Normal appearance of the right adrenal gland. Normal appearance of the urinary bladder given degree distention. Stomach/Bowel: Rather extensive colonic diverticulosis without evidence of superimposed acute tear diverticulitis. Moderate colonic stool burden without evidence of enteric obstruction. Normal appearance of the terminal ileum and retrocecal appendix. No pneumoperitoneum, pneumatosis or portal venous gas. Vascular/Lymphatic: Moderate to  large amount of eccentric mixed calcified and noncalcified atherosclerotic plaque throughout the abdominal aorta. Unchanged aneurysmal dilatation involving the mid aspect of the abdominal aorta measuring approximately 3.8 x 4.1 x 3.6 cm as measured in greatest oblique short axis axial (image 39, series 3), coronal (image 51, series 6) and sagittal (image 67, series 7) dimensions respectively. Reproductive: Borderline enlarged prostate gland. Smaller fluid seen within the lower pelvis and pelvic cul-de-sac. Other: Diffuse body wall anasarca. Small bilateral mesenteric fat containing periumbilical hernias. Musculoskeletal: No acute or aggressive osseous abnormalities. Post L4-L5 intervertebral disc space replacement without definite evidence of hardware failure loosening. Stigmata of DISH within the caudal aspect of the thoracic spine. IMPRESSION: 1. Suspected retraction of hepatic percutaneous drainage catheter with end coiled and locked within the peripheral aspect of the residual ill-defined hepatic abscess. The hepatic abscess appears to have decreased in size the interval, currently measuring 7.5 x 4.9 x 6.9 cm, previously, 7.4 x 6.1 x  8.2 cm, with decreased liquified component. 2. Interval development of small to moderate size right-sided effusion and small amount of intra-abdominal ascites. 3. Otherwise, no new definable/drainable fluid collections within the liver, abdomen or pelvis. 4. 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 5. Extensive colonic diverticulosis without evidence of diverticulitis. 6. Grossly unchanged size of approximately 4.1 cm abdominal aortic aneurysm. Recommend followup by ultrasound in 1 year. This recommendation follows ACR consensus guidelines: White Paper of the ACR Incidental Findings Committee II on Vascular Findings. J Am Coll Radiol 2013; 10:789-794. 7. Aortic Atherosclerosis (ICD10-I70.0). Aortic aneurysm NOS (ICD10-I71.9). Electronically Signed   By: Sandi Mariscal M.D.   On: 07/19/2016 15:10   Ir Thoracentesis Asp Pleural Space W/img Guide  Result Date: 07/19/2016 INDICATION: Recent history of a liver abscess status post percutaneous drainage. Now with serosanguineous drainage around his abdominal drain with CT imaging showing new right pleural effusion. Request for diagnostic and therapeutic thoracentesis. EXAM: ULTRASOUND GUIDED DIAGNOSTIC AND THERAPEUTIC THORACENTESIS MEDICATIONS: 1% lidocaine COMPLICATIONS: None immediate. PROCEDURE: An ultrasound guided thoracentesis was thoroughly discussed with the patient and questions answered. The benefits, risks, alternatives and complications were also discussed. The patient understands and wishes to proceed with the procedure. Written consent was obtained. Ultrasound was performed to localize and mark an adequate pocket of fluid in the right chest. The area was then prepped and draped in the normal sterile fashion. 1% Lidocaine was used for local anesthesia. Under ultrasound guidance a Safe-T-Centesis catheter was introduced. Thoracentesis was performed. The catheter was removed and a dressing applied. FINDINGS: A total of  approximately 0.65 L of slightly cloudy serosanguineous fluid was removed. Samples were sent to the laboratory as requested by the clinical team. IMPRESSION: Successful ultrasound guided right thoracentesis yielding 0.65 L of pleural fluid. Read by: Saverio Danker, PA-C Electronically Signed   By: Sandi Mariscal M.D.   On: 07/19/2016 16:31    HISTORICAL MICRO/IMAGING Abscess, hepatic cx>> strep intermedius  Blood cx 6/5>> strep intermedius  Assessment/Plan:    Right hepatic lobe abscess  Strep intermedius bacteremia Patient had his drain replaced today. Would continue with Ceftriaxone 2 g IV daily through his PICC line for another 2 weeks (can keep previous end date 08/01/16) and then start PO Augmentin for an additional 2-4 weeks. He will need close follow up with IR and ID. We will schedule ID follow up.   Pleural effusion: Likely reactive to hepatic abscess. Added on glucose, pH, protein, and LDH along with serum LDH to help determine transudate vs exudate and  if complicated vs uncomplicated. The pH will likely be unreliable given the pleural fluid is already 1 day old. Will follow cultures, but expect them to remain without growth.    Thank you for this interesting consult! Attending note to follow.  Albin Felling, MD, MPH Internal Medicine Resident, PGY-3 Pager: [phone]

## 2016-07-21 LAB — COMPREHENSIVE METABOLIC PANEL
ALBUMIN: 1.6 g/dL — AB (ref 3.5–5.0)
ALT: 14 U/L — ABNORMAL LOW (ref 17–63)
AST: 28 U/L (ref 15–41)
Alkaline Phosphatase: 308 U/L — ABNORMAL HIGH (ref 38–126)
Anion gap: 6 (ref 5–15)
BILIRUBIN TOTAL: 0.9 mg/dL (ref 0.3–1.2)
BUN: 5 mg/dL — AB (ref 6–20)
CHLORIDE: 101 mmol/L (ref 101–111)
CO2: 28 mmol/L (ref 22–32)
Calcium: 7.6 mg/dL — ABNORMAL LOW (ref 8.9–10.3)
Creatinine, Ser: 0.73 mg/dL (ref 0.61–1.24)
GFR calc Af Amer: >60 mL/min (ref >60)
GFR calc non Af Amer: >60 mL/min (ref >60)
GLUCOSE: 101 mg/dL — AB (ref 65–99)
POTASSIUM: 4.1 mmol/L (ref 3.5–5.1)
Sodium: 135 mmol/L (ref 135–145)
Total Protein: 5.2 g/dL — ABNORMAL LOW (ref 6.5–8.1)

## 2016-07-21 LAB — CBC WITH DIFFERENTIAL/PLATELET
BASOS ABS: 0 10*3/uL (ref 0.0–0.1)
Basophils Relative: 0 %
Eosinophils Absolute: 0 10*3/uL (ref 0.0–0.7)
Eosinophils Relative: 1 %
HEMATOCRIT: 25.7 % — AB (ref 39.0–52.0)
Hemoglobin: 8.1 g/dL — ABNORMAL LOW (ref 13.0–17.0)
Lymphocytes Relative: 16 %
Lymphs Abs: 1.2 10*3/uL (ref 0.7–4.0)
MCH: 27.1 pg (ref 26.0–34.0)
MCHC: 31.5 g/dL (ref 30.0–36.0)
MCV: 86 fL (ref 78.0–100.0)
MONO ABS: 0.5 10*3/uL (ref 0.1–1.0)
Monocytes Relative: 7 %
NEUTROS ABS: 5.6 10*3/uL (ref 1.7–7.7)
Neutrophils Relative %: 76 %
Platelets: 233 10*3/uL (ref 150–400)
RBC: 2.99 MIL/uL — AB (ref 4.22–5.81)
RDW: 16.7 % — AB (ref 11.5–15.5)
WBC: 7.4 10*3/uL (ref 4.0–10.5)

## 2016-07-21 MED ORDER — CEFTRIAXONE IV (FOR PTA / DISCHARGE USE ONLY): 2.0000 g | INTRAVENOUS | 0 refills | Status: DC

## 2016-07-21 MED ORDER — METOPROLOL TARTRATE 25 MG PO TABS: 12.5000 mg | ORAL_TABLET | Freq: Two times a day (BID) | ORAL | 0 refills | Status: DC

## 2016-07-21 MED ORDER — POLYSACCHARIDE IRON COMPLEX 150 MG PO CAPS: 150.0000 mg | ORAL_CAPSULE | Freq: Every day | ORAL | Status: DC

## 2016-07-21 MED ORDER — SENNOSIDES-DOCUSATE SODIUM 8.6-50 MG PO TABS: 1.0000 | ORAL_TABLET | Freq: Two times a day (BID) | ORAL | Status: DC

## 2016-07-21 MED ORDER — OXYCODONE HCL 5 MG PO TABS: 5.0000 mg | ORAL_TABLET | ORAL | 0 refills | Status: DC | PRN

## 2016-07-21 MED ORDER — HEPARIN SOD (PORK) LOCK FLUSH 100 UNIT/ML IV SOLN
250.0000 [IU] | INTRAVENOUS | Status: AC | PRN
  Administered 2016-07-21: 250 [IU]

## 2016-07-21 NOTE — Discharge Summary (Addendum)
Physician Discharge Summary  Brandon [name] PRF:[id number] DOB: [birth year] DOA: 07/19/2016  PCP: Brandon Jeans, PA-C  Admit date: 07/19/2016 Discharge date: 07/21/2016  Time spent: 60 minutes  Recommendations for Outpatient Follow-up:  1. Follow-up with Dr. Linus [name] ID on 08/03/2016. 2. Follow-up with Dr. Earleen [name] in drain clinic. 3. Follow-up with PCP as previously scheduled. On follow-up patient in need a CBC done to follow-up on H&H patient also need a comprehensive metabolic profile done to follow-up on elect/renal function and LFTs. Patient will be discharged on oral iron supplementation.   Discharge Diagnoses:  Principal Problem:   Pleural effusion Active Problems:   Hyperlipidemia   CAD, NATIVE VESSEL   Hypertension   Liver abscess   Bacteremia due to Streptococcus   Pleural effusion, right   Discharge Condition: Stable and improved  Diet recommendation: Heart healthy  Filed Weights   07/19/16 1714  Weight: 82.2 kg (181 lb 3.2 oz)    History of present illness:  Per Dr. Abigail [name] is a 76 y.o. male with medical history significant of CAD, GERD, osteoarthritis, hypertension who was recently admitted and treated for a liver abscess on 6/5. He had a drain placed by IR on 6/6. PICC line in place and had been receiving Rocephin 2 grams daily since discharge.  The culture was positive for Strep,     Pt was seen in Interventional radiology on the day of admission, for evaluation of drainage around  drain site.    Pt had a ct scan and ultrasound which showed a new pleural effusion.  IR PA did a thoracentesis and drained 650 cc of red fluid.  Dr. Deniece [name] radiologist request pt be admitted for Infectious disease consult and antibiotics.  He reported fluid may be reactive.  He was planning to attempt repositioning of liver drain  07/20/16.  Pt relayed the history that illness began 1 month ago when he developed fevers. Pt saw his primary MD and was treated for a tick borne  illness with oral antibiotic. He  had progressively worsening illness.    Pt was discharged home on 6/11 and has had continued weakness   Hospital Course:  #1 right-sided pleural effusion Questionable etiology. Concerned that may likely be secondary to right-sided liver abscess. Patient status post diagnostic and therapeutic thoracentesis with Gram stain showing no organisms however abundant WBC present. Fluid culture pending with no growth to date. Patient currently afebrile. By light's criteria and exudate. Patient currently with recently diagnosed liver abscess and streptococcus bacteremia on IV Rocephin. Continue IV Rocephin. Likely outpatient follow-up.  #2 liver abscess Patient noted to have a liver abscess during last hospitalization in drain was placed. Patient presenting to IR on admission her manipulation of drain placement. Patient status post ultrasound guided liver drainage with placement of a new 12 F drain per Dr. Earleen [name] 07/20/2016. Procedure without any complications. Continue IV Rocephin. Patient given 10 pills of oxycontin as needed for pain at drain site until patient able to follow up with PCP. Outpatient follow-up in drain clinic.  #3 bacteremia due to Streptococcus Patient remained afebrile. Patient had a normal white count during the hospitalization. Patient was continued on empiric IV Rocephin as she was on prior to admission. Patient was seen in consultation by infectious disease per recommendations from IR lead recommended to continue empiric IV Rocephin for 2 more weeks and then subsequently transition to oral Keflex to Augmentin after that if the joint is out for an additional 2-4 weeks. Patient will follow  with ID in approximately 2 weeks.   #4 iron deficiency anemia Anemia panel consistent with iron deficiency anemia. Patient was given a dose of IV Feraheme. Hemoglobin stabilized at 8.1. Patient will be discharged on oral iron supplementation. Outpatient follow-up.    #5 hypertension Stable. Continued on Norvasc, Imdur and metoprolol.  #6 hyperlipidemia Continued fish oil.  #7 coronary artery disease Stable. Continued on home regimen of Norvasc, statin, imdur, metoprolol.   Procedures:  Ultrasound-guided diagnostic and therapeutic thoracentesis 07/19/2016 per Saverio Danker, PA IR  Ultrasound-guided liver drainage with placement of a 8 F drain per Dr. Earleen [name] 07/20/2016  Consultations:  Infectious disease: Dr.Comer 07/20/2016  Interventional radiology Dr. Earleen [name] 07/19/2016  Discharge Exam: Vitals:   07/21/16 0433 07/21/16 1401  BP: 132/66 129/63  Pulse: 94 88  Resp: 18 16  Temp: 99.2 F (37.3 C) 98.4 F (36.9 C)    General: NAD Cardiovascular: RRR Respiratory: CTAB AU:[id number] is soft, nontender, nondistended, positive bowel sounds, new 12 French drain intact with scant blood-tinged material involving tubing. Site is clean, dry.  Discharge Instructions   Discharge Instructions    Diet - low sodium heart healthy    Complete by:  As directed    Home infusion instructions [facility] May follow [place] Dosing Protocol; May administer Cathflo as needed to maintain patency of vascular access device.; Flushing of vascular access device: per [facility] Protocol: 0.9% NaCl pre/post medica...    Complete by:  As directed    Instructions:  May follow [place] Dosing Protocol   Instructions:  May administer Cathflo as needed to maintain patency of vascular access device.   Instructions:  Flushing of vascular access device: per [facility] Protocol: 0.9% NaCl pre/post medication administration and prn patency; Heparin 100 u/ml, 33m for implanted ports and Heparin 10u/ml, 562mfor all other central venous catheters.   Instructions:  May follow [facility] Anaphylaxis Protocol for First Dose Administration in the home: 0.9% NaCl at 25-50 ml/hr to maintain IV access for protocol meds. Epinephrine 0.3 ml IV/IM PRN and Benadryl 25-50 IV/IM PRN s/s of  anaphylaxis.   Instructions:  [facility] Coordinator (RN) to assist per patient IV care needs in the home PRN.   Increase activity slowly    Complete by:  As directed      Current Discharge Medication List    START taking these medications   Details  cefTRIAXone (ROCEPHIN) IVPB Inject 2 g into the vein daily. Indication:  Strep intermedius bacteremia Last Day of Therapy:  08/01/2016 Labs - Once weekly:  CBC/D and BMP, Labs - Every other week:  ESR and CRP Qty: 12 Units, Refills: 0    iron polysaccharides (NU-IRON) 150 MG capsule Take 1 capsule (150 mg total) by mouth daily.    metoprolol tartrate (LOPRESSOR) 25 MG tablet Take 0.5 tablets (12.5 mg total) by mouth 2 (two) times daily. Qty: 60 tablet, Refills: 0    oxyCODONE (OXY IR/ROXICODONE) 5 MG immediate release tablet Take 1-2 tablets (5-10 mg total) by mouth every 4 (four) hours as needed for moderate pain. Qty: 10 tablet, Refills: 0    senna-docusate (SENOKOT-S) 8.6-50 MG tablet Take 1 tablet by mouth 2 (two) times daily.      CONTINUE these medications which have NOT CHANGED   Details  amLODipine (NORVASC) 5 MG tablet TAKE 1 TABLET(5 MG) BY MOUTH DAILY Qty: 15 tablet, Refills: 0    aspirin 81 MG EC tablet Take 81 mg by mouth at bedtime.  cefTRIAXone 2 g in dextrose 5 % 50 mL Inject 2 g into the vein daily. Last dose on 08/01/16 Qty: 16 vial, Refills: 0    cholecalciferol (VITAMIN D) 1000 UNITS tablet Take 1,000 Units by mouth daily.     diphenhydrAMINE (BENADRYL) 25 MG tablet Take 50 mg by mouth every 6 (six) hours as needed for itching or allergies.     isosorbide mononitrate (IMDUR) 30 MG 24 hr tablet TAKE 1 TABLET BY MOUTH AT BEDTIME Qty: 90 tablet, Refills: 1    Multiple Vitamins-Minerals (MULTIVITAMIN PO) Take 1 tablet by mouth daily.    Omega-3 Fatty Acids (FISH OIL) 1000 MG CAPS Take 2,000 mg by mouth 2 (two) times daily.     omeprazole (PRILOSEC) 20 MG capsule TAKE 1 CAPSULE(20 MG) BY  MOUTH DAILY Qty: 30 capsule, Refills: 11   Associated Diagnoses: Dysphagia, pharyngoesophageal phase; Gastroesophageal reflux disease, esophagitis presence not specified; Hx of adenomatous colonic polyps      STOP taking these medications     vitamin B-12 (CYANOCOBALAMIN) 1000 MCG tablet      atorvastatin (LIPITOR) 40 MG tablet        Allergies  Allergen Reactions  . Celecoxib Hives and Itching   Follow-up Information    Comer, Okey Regal, MD. Go on 08/03/2016.   Specialty:  Infectious Diseases Why:  F/U AT 9AM Contact information: [address] [place] [address] [phone]        Corrie Mckusick, DO. Go in 2 week(s).   Specialty:  Interventional Radiology Why:  Office will call to schedule follow up eval.  Contact information: [place] [address] [phone]        Brandon Jeans, PA-C Follow up.   Specialty:  Family Medicine Why:  Follow-up as previously scheduled. Contact information: [address] [place] [address] [phone]            The results of significant diagnostics from this hospitalization (including imaging, microbiology, ancillary and laboratory) are listed below for reference.    Significant Diagnostic Studies: Dg Chest 1 View  Result Date: 07/19/2016 CLINICAL DATA:  Status post right-sided thoracentesis for pleural effusion. EXAM: CHEST 1 VIEW COMPARISON:  Chest x-ray of July 11 2016 and CT scan of the abdomen and pelvis of July 19, 2016 FINDINGS: A pigtail drainage catheter is present in the right lateral aspect of the liver. The lungs are adequately inflated. There is no focal infiltrate. There is no pleural effusion. The heart and pulmonary vascularity are normal. There is calcification in the wall of the aortic arch. The PICC line tip projects over the distal third of the SVC. IMPRESSION: There is no pneumothorax or pleural effusion nor other acute cardiopulmonary disease.  Electronically Signed   By: David  Martinique M.D.   On: 07/19/2016 16:42   Dg Chest 2 View  Result Date: 07/06/2016 CLINICAL DATA:  Acute onset of fever and nausea.  Initial encounter. EXAM: CHEST  2 VIEW COMPARISON:  Chest radiograph from 06/16/2016 FINDINGS: The lungs are well-aerated and clear. There is no evidence of focal opacification, pleural effusion or pneumothorax. The heart is normal in size; the mediastinal contour is within normal limits. No acute osseous abnormalities are seen. IMPRESSION: No acute cardiopulmonary process seen. Electronically Signed   By: Garald Balding M.D.   On: 07/06/2016 22:23   [place] Abscess Drain  Result Date: 07/11/2016 INDICATION: 76 year old male with several month history of melena he is, fever,  night sweats and recent CT imaging highly concerning for hepatic abscess. EXAM: Ultrasound-guided drain placement into hepatic abscess MEDICATIONS: The patient is currently admitted to the hospital and receiving intravenous antibiotics. The antibiotics were administered within an appropriate time frame prior to the initiation of the procedure. ANESTHESIA/SEDATION: Fentanyl 50 mcg IV; Versed 1 mg IV Moderate Sedation Time:  20 minutes The patient was continuously monitored during the procedure by the interventional radiology nurse under my direct supervision. COMPLICATIONS: None immediate. PROCEDURE: Informed written consent was obtained from the patient after a thorough discussion of the procedural risks, benefits and alternatives. All questions were addressed. A timeout was performed prior to the initiation of the procedure. Right upper quadrant was interrogated with ultrasound. A complex cystic mass is identified in the right hemi liver. The overlying skin was sterilely prepped and draped in standard fashion using chlorhexidine skin prep. Local anesthesia was attained by infiltration with 1% lidocaine. A small dermatotomy was made. Under real-time sonographic guidance, a 17 gauge  introducer needle was advanced into the fluid collection. Aspiration was performed yielding frankly purulent material. Therefore, a 0.035 guidewire was coiled within the complex collection. The needle was removed. The percutaneous transhepatic tract was dilated to 10 [place] and a Cook 10.2 [place] all-purpose drainage catheter was advanced over the wire and formed within the complex collection. Aspiration yields approximately 90 mL of thick, purulent fluid. The drainage catheter was gently flushed and connected to JP bulb suction and then secured to the skin with 0 Prolene suture and an adhesive fixation device. IMPRESSION: 1. Aspiration yields frankly purulent fluid. 2. Technically successful placement of a 10 French percutaneous drainage catheter into the hepatic abscess. Aspiration yields approximately 90 mL of thick, purulent fluid which was sent for culture. Signed, Criselda Peaches, MD [facility] Radiology Electronically Signed   By: Jacqulynn Cadet M.D.   On: 07/11/2016 17:55   Ct Abdomen Pelvis W Contrast  Result Date: 07/19/2016 CLINICAL DATA:  History of hepatic abscess, post ultrasound guided hepatic abscess drainage catheter placement on 07/11/2016. Patient returns to the hospital today complaining of worsening leakage of fluid adjacent to the hepatic abscess drainage catheter. EXAM: CT ABDOMEN AND PELVIS WITH CONTRAST TECHNIQUE: Multidetector CT imaging of the abdomen and pelvis was performed using the standard protocol following bolus administration of intravenous contrast. CONTRAST:  100 cc Isovue 300 COMPARISON:  CT scan abdomen pelvis - 07/10/2016; 01/03/2011; ultrasound-guided hepatic abscess drainage catheter placement -07/11/2016 FINDINGS: Lower chest: Limited visualization of lower thorax demonstrates development of a small to moderate size right-sided effusion and trace left-sided effusion with associated bibasilar subsegmental  atelectasis. Normal heart size. Calcifications within the aortic valve leaflets. No pericardial effusion. Hepatobiliary: Normal hepatic contour. Suspected retraction of percutaneous step pack abscess drainage catheter with and coiled and locked within the peripheral aspect the right lobe of the liver. Ill-defined hepatic abscess has minimally improved in the interval, currently measuring approximately 7.5 x 4.9 x 6.9 cm (axial image 16, series 3, coronal image 55, series 6), previously, 7.4 x 6.1 x 8.2 cm, with decreased liquified component. Interval development of a small amount of perihepatic fluid/ascites. No new definable/drainable intrahepatic abscess ease. Post cholecystectomy. No definite intrahepatic bili duct dilatation. 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 Pancreas: Normal appearance of the pancreas Spleen: Normal appearance of the spleen Adrenals/Urinary Tract: There is symmetric enhancement of the bilateral kidneys. Re- demonstrated geographic atrophy involving the inferior  pole the left kidney, likely a sequela of prior infection or ischemic injury. Calcifications about the bilateral renal hilar favored to be vascular in etiology. No definite renal stones this postcontrast examination. No urine obstruction. Minimal thickening involving the crux of the left adrenal gland, too small to adequately characterize. Normal appearance of the right adrenal gland. Normal appearance of the urinary bladder given degree distention. Stomach/Bowel: Rather extensive colonic diverticulosis without evidence of superimposed acute tear diverticulitis. Moderate colonic stool burden without evidence of enteric obstruction. Normal appearance of the terminal ileum and retrocecal appendix. No pneumoperitoneum, pneumatosis or portal venous gas. Vascular/Lymphatic: Moderate to large amount of eccentric mixed calcified and noncalcified atherosclerotic plaque  throughout the abdominal aorta. Unchanged aneurysmal dilatation involving the mid aspect of the abdominal aorta measuring approximately 3.8 x 4.1 x 3.6 cm as measured in greatest oblique short axis axial (image 39, series 3), coronal (image 51, series 6) and sagittal (image 67, series 7) dimensions respectively. Reproductive: Borderline enlarged prostate gland. Smaller fluid seen within the lower pelvis and pelvic cul-de-sac. Other: Diffuse body wall anasarca. Small bilateral mesenteric fat containing periumbilical hernias. Musculoskeletal: No acute or aggressive osseous abnormalities. Post L4-L5 intervertebral disc space replacement without definite evidence of hardware failure loosening. Stigmata of DISH within the caudal aspect of the thoracic spine. IMPRESSION: 1. Suspected retraction of hepatic percutaneous drainage catheter with end coiled and locked within the peripheral aspect of the residual ill-defined hepatic abscess. The hepatic abscess appears to have decreased in size the interval, currently measuring 7.5 x 4.9 x 6.9 cm, previously, 7.4 x 6.1 x 8.2 cm, with decreased liquified component. 2. Interval development of small to moderate size right-sided effusion and small amount of intra-abdominal ascites. 3. Otherwise, no new definable/drainable fluid collections within the liver, abdomen or pelvis. 4. 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 5. Extensive colonic diverticulosis without evidence of diverticulitis. 6. Grossly unchanged size of approximately 4.1 cm abdominal aortic aneurysm. Recommend followup by ultrasound in 1 year. This recommendation follows ACR consensus guidelines: White Paper of the ACR Incidental Findings Committee II on Vascular Findings. J Am Coll Radiol 2013; 10:789-794. 7. Aortic Atherosclerosis (ICD10-I70.0). Aortic aneurysm NOS (ICD10-I71.9). Electronically Signed   By: Sandi Mariscal M.D.   On: 07/19/2016 15:10   Ct Abdomen Pelvis W  Contrast  Result Date: 07/10/2016 CLINICAL DATA:  Nausea vomiting and decreased appetite EXAM: CT ABDOMEN AND PELVIS WITH CONTRAST TECHNIQUE: Multidetector CT imaging of the abdomen and pelvis was performed using the standard protocol following bolus administration of intravenous contrast. CONTRAST:  158m ISOVUE-300 IOPAMIDOL (ISOVUE-300) INJECTION 61% COMPARISON:  None. FINDINGS: Lower chest: Lung bases demonstrate no acute consolidation or pleural effusion. Vascular calcification. No pericardial effusion. Hepatobiliary: Abnormal low-density at the porta hepatis which becomes less apparent on delayed views. Heterogenous low-density in the right hepatic lobe, measuring approximately 7.4 by 6.1 cm. No biliary dilatation. Surgical clips in the gallbladder fossa. Pancreas: Unremarkable. No pancreatic ductal dilatation or surrounding inflammatory changes. Spleen: Normal in size without focal abnormality. Adrenals/Urinary Tract: 1 cm low-density left adrenal gland nodule. Kidneys demonstrate no hydronephrosis. Punctate stone mid pole left kidney. Probable cortical scarring in the lower pole of the left kidney. The bladder is unremarkable. Stomach/Bowel: Mild edema and soft tissue thickening at the gastroduodenal junction. No dilated small bowel. Colon diverticular disease. No acute wall thickening. Normal appendix Vascular/Lymphatic: Extensive atherosclerotic vascular calcification. Portal vein at the hilus and the splenic vein appear patent, however low density noted at the porta hepatis in  the region of right and left portal vessels. Periportal edema is present. Infrarenal abdominal aortic aneurysm measuring 3.9 cm in maximum diameter. Reproductive: Enlarged prostate gland Other: Small free fluid in the pelvis.  No free air Musculoskeletal: Postsurgical changes at L4-L5. There are degenerative changes. No acute or suspicious bone lesion. IMPRESSION: 1. 7.4 cm heterogenous cystic mass in the right hepatic lobe, this  is suspicious for liver abscess in the appropriate clinical setting. Cystic neoplasm also a consideration. 2. Low-density in the porta hepatis could represent altered perfusion ; unable to document patency of the right and left portal veins, consider correlation with Doppler to evaluate for patency of the intrahepatic portal vessels. 3. Small free fluid in the pelvis. Sigmoid colon diverticular disease without acute inflammation 4. Mild soft tissue stranding at the gastroduodenal junction, could relate to mild gastro duodenitis. 5. 3.9 cm infrarenal abdominal aortic aneurysm. Recommend followup by ultrasound in 2 years. This recommendation follows ACR consensus guidelines: White Paper of the ACR Incidental Findings Committee II on Vascular Findings. J Am Coll Radiol 2013; 10:789-794. Electronically Signed   By: Donavan Foil M.D.   On: 07/10/2016 19:38   Dg Chest Port 1 View  Result Date: 07/11/2016 CLINICAL DATA:  Tachypnea EXAM: PORTABLE CHEST 1 VIEW COMPARISON:  07/06/2016 CXR FINDINGS: Low lung volumes with subsegmental atelectasis in the left mid lung and both lung bases. No pneumonic consolidation, effusion or pulmonary edema. No pneumothorax. Heart is top-normal in size with aortic atherosclerosis. No acute nor suspicious osseous abnormalities. IMPRESSION: Low lung volumes with left mid and bibasilar atelectasis. Aortic atherosclerosis. Electronically Signed   By: Ashley Royalty M.D.   On: 07/11/2016 19:15   [place] Image Guided Fluid Drain By Catheter  Result Date: 07/20/2016 INDICATION: 76 year old male with a history of hepatic abscess EXAM: ULTRASOUND GUIDED DRAIN PLACEMENT BEDSIDE REMOVAL OF PRIOR DRAIN WHICH HAD BEEN WITHDRAWN MEDICATIONS: The patient is currently admitted to the hospital and receiving intravenous antibiotics. The antibiotics were administered within an appropriate time frame prior to the initiation of the procedure. ANESTHESIA/SEDATION: Fentanyl 100 mcg IV; Versed 2.0 mg IV Moderate  Sedation Time:  15 minutes The patient was continuously monitored during the procedure by the interventional radiology nurse under my direct supervision. COMPLICATIONS: None PROCEDURE: Informed written consent was obtained from the patient after a thorough discussion of the procedural risks, benefits and alternatives. All questions were addressed. Maximal Sterile Barrier Technique was utilized including caps, mask, sterile gowns, sterile gloves, sterile drape, hand hygiene and skin antiseptic. A timeout was performed prior to the initiation of the procedure. Patient positioned left decubitus and scout images of the abdomen performed. The right flank and the indwelling drain were prepped and draped in the usual sterile fashion. The skin and subcutaneous tissues were generously infiltrated 1% lidocaine for local anesthesia. A small incision was made with 11 blade scalpel and soft tissues were dilated with 8 hemostat clamp. Under ultrasound guidance, a 12 French drain was advanced into hepatic abscess with trocar technique. Drain was formed within the abscess of the right hepatic lobe, and a final image was stored demonstrating the drain within the complex fluid/ tissue. Drain was attached to to a bulb drainage, an the prior drain was removed given that it had been partially withdrawn. Sterile bandages were placed. Patient tolerated the procedure well and remained hemodynamically stable throughout. No complications were encountered and no significant blood loss. IMPRESSION: Status post ultrasound-guided drain placement into right hepatic abscess with placement of a 12 French drain.  Bedside removal partially withdrawn prior drain. Signed, Dulcy Fanny. Brandon Newport, DO [facility] Radiology Electronically Signed   By: Corrie Mckusick D.O.   On: 07/20/2016 13:04   US Abdomen Limited Ruq  Result Date: 07/10/2016 CLINICAL DATA:  Abnormal CT.  Portal vein obstruction. EXAM: ULTRASOUND  ABDOMEN LIMITED RIGHT UPPER QUADRANT COMPARISON:  Abdominal CT earlier this day. FINDINGS: Gallbladder: Surgically absent. Common bile duct: Diameter: 5.7 mm, normal. Liver: Heterogeneous 7.0 x 7.5 x 6.4 cm lesion in the right hepatic lobe corresponding to abnormality on CT. There is an internal cystic components. Hepatic echotexture is heterogeneous, particularly centrally at the porta hepatis. There is normal directional flow of the portal vein at the porta hepatis, however possible cut off with questionable filling defect and surrounding collaterals at the porta hepatis. The central and left hepatic veins are visualized and are patent. IMPRESSION: 1. Questionable filling defect in the portal vein at the porta hepatis with surrounding collaterals, extrahepatic portal vein demonstrates normal directional flow. 2. Heterogeneous 7 x 7.5 x 6.4 cm lesion in the right hepatic lobe with some internal cystic components. Recommend MRI characterization with contrast. Electronically Signed   By: Jeb Levering M.D.   On: 07/10/2016 22:14   Ir Thoracentesis Asp Pleural Space W/img Guide  Result Date: 07/19/2016 INDICATION: Recent history of a liver abscess status post percutaneous drainage. Now with serosanguineous drainage around his abdominal drain with CT imaging showing new right pleural effusion. Request for diagnostic and therapeutic thoracentesis. EXAM: ULTRASOUND GUIDED DIAGNOSTIC AND THERAPEUTIC THORACENTESIS MEDICATIONS: 1% lidocaine COMPLICATIONS: None immediate. PROCEDURE: An ultrasound guided thoracentesis was thoroughly discussed with the patient and questions answered. The benefits, risks, alternatives and complications were also discussed. The patient understands and wishes to proceed with the procedure. Written consent was obtained. Ultrasound was performed to localize and mark an adequate pocket of fluid in the right chest. The area was then prepped and draped in the normal sterile fashion. 1%  Lidocaine was used for local anesthesia. Under ultrasound guidance a Safe-T-Centesis catheter was introduced. Thoracentesis was performed. The catheter was removed and a dressing applied. FINDINGS: A total of approximately 0.65 L of slightly cloudy serosanguineous fluid was removed. Samples were sent to the laboratory as requested by the clinical team. IMPRESSION: Successful ultrasound guided right thoracentesis yielding 0.65 L of pleural fluid. Read by: Saverio Danker, PA-C Electronically Signed   By: Sandi Mariscal M.D.   On: 07/19/2016 16:31    Microbiology: Recent Results (from the past 240 hour(s))  Aerobic/Anaerobic Culture (surgical/deep wound)     Status: None   Collection Time: 07/11/16  5:22 PM  Result Value Ref Range Status   Specimen Description ABSCESS HEPATIC  Final   Special Requests NONE  Final   Gram Stain   Final    ABUNDANT WBC PRESENT, PREDOMINANTLY PMN ABUNDANT GRAM POSITIVE COCCI IN PAIRS    Culture   Final    ABUNDANT STREPTOCOCCUS INTERMEDIUS NO ANAEROBES ISOLATED Performed at [facility], [address], [place], [place] [address]    Report Status 07/16/2016 FINAL  Final   Organism ID, Bacteria STREPTOCOCCUS INTERMEDIUS  Final      Susceptibility   Streptococcus intermedius - MIC*    PENICILLIN <=0.06 SENSITIVE Sensitive     CEFTRIAXONE <=0.12 SENSITIVE Sensitive     ERYTHROMYCIN >=8 RESISTANT Resistant     LEVOFLOXACIN <=0.25 SENSITIVE Sensitive     VANCOMYCIN 0.25 SENSITIVE Sensitive     * ABUNDANT STREPTOCOCCUS INTERMEDIUS  MRSA  PCR Screening     Status: None   Collection Time: 07/11/16  6:54 PM  Result Value Ref Range Status   MRSA by PCR NEGATIVE NEGATIVE Final    Comment:        The GeneXpert MRSA Assay (FDA approved for NASAL specimens only), is one component of a comprehensive MRSA colonization surveillance program. It is not intended to diagnose MRSA infection nor to guide or monitor treatment for MRSA infections.   Culture, body  fluid-bottle     Status: None (Preliminary result)   Collection Time: 07/19/16  4:24 PM  Result Value Ref Range Status   Specimen Description PLEURAL  Final   Special Requests RIGHT PLEURAL  Final   Culture NO GROWTH 2 DAYS  Final   Report Status PENDING  Incomplete  Gram stain     Status: None   Collection Time: 07/19/16  4:24 PM  Result Value Ref Range Status   Specimen Description PLEURAL  Final   Special Requests RIGHT PLEURAL  Final   Gram Stain   Final    ABUNDANT WBC PRESENT, PREDOMINANTLY PMN NO ORGANISMS SEEN    Report Status 07/19/2016 FINAL  Final  Culture, blood (routine x 2)     Status: None (Preliminary result)   Collection Time: 07/19/16  6:18 PM  Result Value Ref Range Status   Specimen Description BLOOD LEFT ANTECUBITAL  Final   Special Requests   Final    BOTTLES DRAWN AEROBIC AND ANAEROBIC Blood Culture adequate volume   Culture NO GROWTH 2 DAYS  Final   Report Status PENDING  Incomplete  Culture, blood (routine x 2)     Status: None (Preliminary result)   Collection Time: 07/19/16  6:18 PM  Result Value Ref Range Status   Specimen Description BLOOD RIGHT HAND  Final   Special Requests   Final    BOTTLES DRAWN AEROBIC AND ANAEROBIC Blood Culture adequate volume   Culture NO GROWTH 2 DAYS  Final   Report Status PENDING  Incomplete     Labs: Basic Metabolic Panel:  Recent Labs Lab 07/15/16 0615 07/18/16 1246 07/19/16 1818 07/20/16 0310 07/21/16 0330  NA 140 139 136 134* 135  K 3.6 4.0 3.5 3.5 4.1  CL 105 104 102 101 101  CO2 '26 31 26 26 28  ' GLUCOSE 101* 115* 107* 100* 101*  BUN 16 9 5* <5* 5*  CREATININE 0.65 0.51 0.54* 0.61 0.73  CALCIUM 7.7* 7.6* 7.5* 7.5* 7.6*   Liver Function Tests:  Recent Labs Lab 07/15/16 0615 07/18/16 1246 07/19/16 1818 07/21/16 0330  AST '18 19 24 28  ' ALT 20 11 15* 14*  ALKPHOS 427* 425* 359* 308*  BILITOT 1.2 0.8 0.9 0.9  PROT 4.8* 5.0* 5.6* 5.2*  ALBUMIN 1.7* 2.1* 1.8* 1.6*   No results for input(s):  LIPASE, AMYLASE in the last 168 hours. No results for input(s): AMMONIA in the last 168 hours. CBC:  Recent Labs Lab 07/18/16 1246 07/19/16 1818 07/20/16 0310 07/21/16 0330  WBC 6.7 6.4 6.0 7.4  NEUTROABS  --  4.6  --  5.6  HGB 9.4* 9.1* 8.1* 8.1*  HCT 29.4* 28.9* 25.9* 25.7*  MCV 85.1 85.5 86.0 86.0  PLT 233.0 252 227 233   Cardiac Enzymes: No results for input(s): CKTOTAL, CKMB, CKMBINDEX, TROPONINI in the last 168 hours. BNP: BNP (last 3 results) No results for input(s): BNP in the last 8760 hours.  ProBNP (last 3 results) No results for input(s): PROBNP in the last 8760  hours.  CBG: No results for input(s): GLUCAP in the last 168 hours.     SignedIrine Seal MD.  [facility] 07/21/2016, 3:06 PM

## 2016-07-21 NOTE — Progress Notes (Signed)
Chief Complaint: Patient was seen today for follow up hepatic abscess drain  Referring Physician(s): Saverio Danker  Supervising Physician: Corrie Mckusick  Patient Status: [facility] - In-pt  Subjective: Follow up after pt underwent removal of old and placement of new hepatic abscess drain yesterday. Feels ok Not much output  Objective: Physical Exam: BP 132/66 (BP Location: Left Arm)   Pulse 94   Temp 99.2 F (37.3 C) (Oral)   Resp 18   Ht 5\' 10"  (1.778 m)   Wt 181 lb 3.2 oz (82.2 kg)   SpO2 92%   BMI 26.00 kg/m  New 12 Fr drain intact. Scant blood tinged material in tubing and bulb. Site clean, NT   Current Facility-Administered Medications:  .  amLODipine (NORVASC) tablet 5 mg, 5 mg, Oral, Daily, Sofia, Leslie K, [place], 5 mg at 07/21/16 0820 .  atorvastatin (LIPITOR) tablet 40 mg, 40 mg, Oral, Daily, Sofia, Leslie K, PA-C, 40 mg at 07/21/16 0820 .  cefTRIAXone (ROCEPHIN) 2 g in dextrose 5 % 50 mL IVPB, 2 g, Intravenous, Q24H, Sofia, Leslie K, [place], Stopped at 07/20/16 1910 .  cholecalciferol (VITAMIN D) tablet 1,000 Units, 1,000 Units, Oral, Daily, Fransico Meadow, [place], 1,000 Units at 07/21/16 0820 .  diphenhydrAMINE (BENADRYL) capsule 50 mg, 50 mg, Oral, Q6H PRN, Fransico Meadow, PA-C, 50 mg at 07/20/16 1416 .  isosorbide mononitrate (IMDUR) 24 hr tablet 30 mg, 30 mg, Oral, QHS, Sofia, Leslie K, PA-C, 30 mg at 07/20/16 2329 .  metoprolol tartrate (LOPRESSOR) tablet 12.5 mg, 12.5 mg, Oral, BID, Eugenie Filler, MD, 12.5 mg at 07/21/16 0819 .  omega-3 acid ethyl esters (LOVAZA) capsule 1 g, 1 g, Oral, BID, Waldemar Dickens, MD, 1 g at 07/21/16 0820 .  ondansetron (ZOFRAN) tablet 4 mg, 4 mg, Oral, Q6H PRN **OR** ondansetron (ZOFRAN) injection 4 mg, 4 mg, Intravenous, Q6H PRN, Sofia, Leslie K, PA-C .  oxyCODONE (Oxy IR/ROXICODONE) immediate release tablet 5-10 mg, 5-10 mg, Oral, Q4H PRN, Eugenie Filler, MD, 10 mg at 07/21/16 0820 .  pantoprazole (PROTONIX) EC tablet 40  mg, 40 mg, Oral, Daily, Eugenie Filler, MD, 40 mg at 07/21/16 0820 .  senna-docusate (Senokot-S) tablet 1 tablet, 1 tablet, Oral, BID, Eugenie Filler, MD, 1 tablet at 07/21/16 0820 .  sodium chloride flush (NS) 0.9 % injection 3 mL, 3 mL, Intravenous, Q12H, Sofia, Leslie K, [place], 3 mL at 07/20/16 2334  Labs: CBC  Recent Labs  07/20/16 0310 07/21/16 0330  WBC 6.0 7.4  HGB 8.1* 8.1*  HCT 25.9* 25.7*  PLT 227 233   BMET  Recent Labs  07/20/16 0310 07/21/16 0330  NA 134* 135  K 3.5 4.1  CL 101 101  CO2 26 28  GLUCOSE 100* 101*  BUN <5* 5*  CREATININE 0.61 0.73  CALCIUM 7.5* 7.6*   LFT  Recent Labs  07/21/16 0330  PROT 5.2*  ALBUMIN 1.6*  AST 28  ALT 14*  ALKPHOS 308*  BILITOT 0.9   PT/INR  Recent Labs  07/19/16 1818  LABPROT 16.1*  INR 1.29     Studies/Results: Dg Chest 1 View  Result Date: 07/19/2016 CLINICAL DATA:  Status post right-sided thoracentesis for pleural effusion. EXAM: CHEST 1 VIEW COMPARISON:  Chest x-ray of July 11 2016 and CT scan of the abdomen and pelvis of July 19, 2016 FINDINGS: A pigtail drainage catheter is present in the right lateral aspect of the liver. The lungs are adequately inflated. There is no focal  infiltrate. There is no pleural effusion. The heart and pulmonary vascularity are normal. There is calcification in the wall of the aortic arch. The PICC line tip projects over the distal third of the SVC. IMPRESSION: There is no pneumothorax or pleural effusion nor other acute cardiopulmonary disease. Electronically Signed   By: David  Martinique M.D.   On: 07/19/2016 16:42   Ct Abdomen Pelvis W Contrast  Result Date: 07/19/2016 CLINICAL DATA:  History of hepatic abscess, post ultrasound guided hepatic abscess drainage catheter placement on 07/11/2016. Patient returns to the hospital today complaining of worsening leakage of fluid adjacent to the hepatic abscess drainage catheter. EXAM: CT ABDOMEN AND PELVIS WITH CONTRAST  TECHNIQUE: Multidetector CT imaging of the abdomen and pelvis was performed using the standard protocol following bolus administration of intravenous contrast. CONTRAST:  100 cc Isovue 300 COMPARISON:  CT scan abdomen pelvis - 07/10/2016; 01/03/2011; ultrasound-guided hepatic abscess drainage catheter placement -07/11/2016 FINDINGS: Lower chest: Limited visualization of lower thorax demonstrates development of a small to moderate size right-sided effusion and trace left-sided effusion with associated bibasilar subsegmental atelectasis. Normal heart size. Calcifications within the aortic valve leaflets. No pericardial effusion. Hepatobiliary: Normal hepatic contour. Suspected retraction of percutaneous step pack abscess drainage catheter with and coiled and locked within the peripheral aspect the right lobe of the liver. Ill-defined hepatic abscess has minimally improved in the interval, currently measuring approximately 7.5 x 4.9 x 6.9 cm (axial image 16, series 3, coronal image 55, series 6), previously, 7.4 x 6.1 x 8.2 cm, with decreased liquified component. Interval development of a small amount of perihepatic fluid/ascites. No new definable/drainable intrahepatic abscess ease. Post cholecystectomy. No definite intrahepatic bili duct dilatation. 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 Pancreas: Normal appearance of the pancreas Spleen: Normal appearance of the spleen Adrenals/Urinary Tract: There is symmetric enhancement of the bilateral kidneys. Re- demonstrated geographic atrophy involving the inferior pole the left kidney, likely a sequela of prior infection or ischemic injury. Calcifications about the bilateral renal hilar favored to be vascular in etiology. No definite renal stones this postcontrast examination. No urine obstruction. Minimal thickening involving the crux of the left adrenal gland, too small to adequately characterize.  Normal appearance of the right adrenal gland. Normal appearance of the urinary bladder given degree distention. Stomach/Bowel: Rather extensive colonic diverticulosis without evidence of superimposed acute tear diverticulitis. Moderate colonic stool burden without evidence of enteric obstruction. Normal appearance of the terminal ileum and retrocecal appendix. No pneumoperitoneum, pneumatosis or portal venous gas. Vascular/Lymphatic: Moderate to large amount of eccentric mixed calcified and noncalcified atherosclerotic plaque throughout the abdominal aorta. Unchanged aneurysmal dilatation involving the mid aspect of the abdominal aorta measuring approximately 3.8 x 4.1 x 3.6 cm as measured in greatest oblique short axis axial (image 39, series 3), coronal (image 51, series 6) and sagittal (image 67, series 7) dimensions respectively. Reproductive: Borderline enlarged prostate gland. Smaller fluid seen within the lower pelvis and pelvic cul-de-sac. Other: Diffuse body wall anasarca. Small bilateral mesenteric fat containing periumbilical hernias. Musculoskeletal: No acute or aggressive osseous abnormalities. Post L4-L5 intervertebral disc space replacement without definite evidence of hardware failure loosening. Stigmata of DISH within the caudal aspect of the thoracic spine. IMPRESSION: 1. Suspected retraction of hepatic percutaneous drainage catheter with end coiled and locked within the peripheral aspect of the residual ill-defined hepatic abscess. The hepatic abscess appears to have decreased in size the interval, currently measuring 7.5 x 4.9 x 6.9  cm, previously, 7.4 x 6.1 x 8.2 cm, with decreased liquified component. 2. Interval development of small to moderate size right-sided effusion and small amount of intra-abdominal ascites. 3. Otherwise, no new definable/drainable fluid collections within the liver, abdomen or pelvis. 4. 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  5. Extensive colonic diverticulosis without evidence of diverticulitis. 6. Grossly unchanged size of approximately 4.1 cm abdominal aortic aneurysm. Recommend followup by ultrasound in 1 year. This recommendation follows ACR consensus guidelines: White Paper of the ACR Incidental Findings Committee II on Vascular Findings. J Am Coll Radiol 2013; 10:789-794. 7. Aortic Atherosclerosis (ICD10-I70.0). Aortic aneurysm NOS (ICD10-I71.9). Electronically Signed   By: Sandi Mariscal M.D.   On: 07/19/2016 15:10   [place] Image Guided Fluid Drain By Catheter  Result Date: 07/20/2016 INDICATION: 76 year old male with a history of hepatic abscess EXAM: ULTRASOUND GUIDED DRAIN PLACEMENT BEDSIDE REMOVAL OF PRIOR DRAIN WHICH HAD BEEN WITHDRAWN MEDICATIONS: The patient is currently admitted to the hospital and receiving intravenous antibiotics. The antibiotics were administered within an appropriate time frame prior to the initiation of the procedure. ANESTHESIA/SEDATION: Fentanyl 100 mcg IV; Versed 2.0 mg IV Moderate Sedation Time:  15 minutes The patient was continuously monitored during the procedure by the interventional radiology nurse under my direct supervision. COMPLICATIONS: None PROCEDURE: Informed written consent was obtained from the patient after a thorough discussion of the procedural risks, benefits and alternatives. All questions were addressed. Maximal Sterile Barrier Technique was utilized including caps, mask, sterile gowns, sterile gloves, sterile drape, hand hygiene and skin antiseptic. A timeout was performed prior to the initiation of the procedure. Patient positioned left decubitus and scout images of the abdomen performed. The right flank and the indwelling drain were prepped and draped in the usual sterile fashion. The skin and subcutaneous tissues were generously infiltrated 1% lidocaine for local anesthesia. A small incision was made with 11 blade scalpel and soft tissues were dilated with 8 hemostat clamp.  Under ultrasound guidance, a 12 French drain was advanced into hepatic abscess with trocar technique. Drain was formed within the abscess of the right hepatic lobe, and a final image was stored demonstrating the drain within the complex fluid/ tissue. Drain was attached to to a bulb drainage, an the prior drain was removed given that it had been partially withdrawn. Sterile bandages were placed. Patient tolerated the procedure well and remained hemodynamically stable throughout. No complications were encountered and no significant blood loss. IMPRESSION: Status post ultrasound-guided drain placement into right hepatic abscess with placement of a 12 French drain. Bedside removal partially withdrawn prior drain. Signed, Dulcy Fanny. Earleen Newport, DO [facility] Electronically Signed   By: Corrie Mckusick D.O.   On: 07/20/2016 13:04   Ir Thoracentesis Asp Pleural Space W/img Guide  Result Date: 07/19/2016 INDICATION: Recent history of a liver abscess status post percutaneous drainage. Now with serosanguineous drainage around his abdominal drain with CT imaging showing new right pleural effusion. Request for diagnostic and therapeutic thoracentesis. EXAM: ULTRASOUND GUIDED DIAGNOSTIC AND THERAPEUTIC THORACENTESIS MEDICATIONS: 1% lidocaine COMPLICATIONS: None immediate. PROCEDURE: An ultrasound guided thoracentesis was thoroughly discussed with the patient and questions answered. The benefits, risks, alternatives and complications were also discussed. The patient understands and wishes to proceed with the procedure. Written consent was obtained. Ultrasound was performed to localize and mark an adequate pocket of fluid in the right chest. The area was then prepped and draped in the normal sterile fashion. 1% Lidocaine was used for local  anesthesia. Under ultrasound guidance a Safe-T-Centesis catheter was introduced. Thoracentesis was performed. The catheter was removed  and a dressing applied. FINDINGS: A total of approximately 0.65 L of slightly cloudy serosanguineous fluid was removed. Samples were sent to the laboratory as requested by the clinical team. IMPRESSION: Successful ultrasound guided right thoracentesis yielding 0.65 L of pleural fluid. Read by: Saverio Danker, PA-C Electronically Signed   By: Sandi Mariscal M.D.   On: 07/19/2016 16:31    Assessment/Plan: S/p placement of new hepatic abscess drain Given appearance on CT, would expect low volume output and some debris as well as slow contracture of the abscess cavity. Continue flushes as ordered. IR cont to follow    LOS: 2 days   I spent a total of 15 minutes in face to face in clinical consultation, greater than 50% of which was counseling/coordinating care for hepatic abscess drain  Jrue Yambao PA-C 07/21/2016 9:26 AM

## 2016-07-21 NOTE — Discharge Instructions (Signed)
Flush drainage catheter once daily.

## 2016-07-21 NOTE — Progress Notes (Signed)
JP drain tubing milked, as it was noted to have dark red, coagulated blood in it and minimal drainage in bulb over night.  Only 5cc serosanguinous drainage emptied this morning at change of shift, suction reapplied.  No new drainage noted after milking tubing.  Dressing clean dry and intact.  Pt pain 7/10 at site of drain, pain meds given.

## 2016-07-22 DIAGNOSIS — K750 Abscess of liver: Secondary | ICD-10-CM | POA: Diagnosis not present

## 2016-07-22 DIAGNOSIS — D696 Thrombocytopenia, unspecified: Secondary | ICD-10-CM | POA: Diagnosis not present

## 2016-07-22 DIAGNOSIS — I2510 Atherosclerotic heart disease of native coronary artery without angina pectoris: Secondary | ICD-10-CM | POA: Diagnosis not present

## 2016-07-22 DIAGNOSIS — J90 Pleural effusion, not elsewhere classified: Secondary | ICD-10-CM | POA: Diagnosis not present

## 2016-07-22 LAB — PH, BODY FLUID: PH, BODY FLUID: 8

## 2016-07-23 ENCOUNTER — Telehealth: Payer: Self-pay | Admitting: Cardiology

## 2016-07-23 ENCOUNTER — Telehealth (HOSPITAL_COMMUNITY): Payer: Self-pay | Admitting: Vascular Surgery

## 2016-07-23 ENCOUNTER — Telehealth: Payer: Self-pay

## 2016-07-23 ENCOUNTER — Other Ambulatory Visit: Payer: Self-pay | Admitting: Cardiology

## 2016-07-23 DIAGNOSIS — I2510 Atherosclerotic heart disease of native coronary artery without angina pectoris: Secondary | ICD-10-CM

## 2016-07-23 NOTE — Telephone Encounter (Signed)
Appointments have been scheduled.

## 2016-07-23 NOTE — Telephone Encounter (Signed)
Patient calling, states that he had a medical problem and went to hospital 3 times. Patient states that he is fighting a bacterial infection in his liver and patient has some fluid build-up that needs to be addressed immediatly.

## 2016-07-23 NOTE — Telephone Encounter (Signed)
Please arrange followup with me this week or next, can work in.  Weight up.  Needs echo prior to appointment (can do same day).

## 2016-07-23 NOTE — Telephone Encounter (Signed)
No answer [phone]

## 2016-07-23 NOTE — Telephone Encounter (Signed)
Pt states starting about 3 months ago he developed high fevers, had numerous tests done, finally diagnosed with liver abscess. Pt states he has been hospitalized a couple of times because of this, last discharged 07/21/16, had pleural effusion this most recent hospitalization.  Pt calling to schedule a follow-up appointment with cardiology because of recent medical issues.  Pt states he was given a lot of IV fluid in hospital, weight is up, just wants to be sure his heart is okay, pt denies any new cardiac symptoms. Pt states it was mentioned to him while in hospital that he may need an echocardiogram.  Pt advised I will forward to Dr Aundra Dubin for review.

## 2016-07-23 NOTE — Telephone Encounter (Signed)
Pt states appts have been scheduled.

## 2016-07-23 NOTE — Progress Notes (Signed)
Patient presents to clinic today for hospital follow-up. Patient presented to ER on 6/18 after 07/10/2016 fatigue, fevers, nausea and vomiting.. Patient was subsequently admitted to the hospital. Patient diagnosed with sepsis 2/2 liver abscess and bacteremia. Patient empirically started on Rocephin and Flagyl, but transitioned to Meropenem and Vancomycin after becoming floridly septi. Drain placed on 07/11/16 with purulent drainage noted. Culture + for strep. Patient was seen by ID who recommended total of 3 weeks of Rocephin. PICC line placed. Thrombocytopenia also noted 2/2 sepsis but was improving on daily labs. Benazepril stopped initially due to concern of developing AKI secondary to dehydration. Was discontinued as BP stable without medication. 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 2 year-follow-up recommended. Patient was discharged home on 07/23/16. Was scheduled with a 1-week follow-up her and 4 week follow-up with IR.  Since discharge patient endorses taking medication as directed. Home Health has come out to the home once thus far. Patient notes significant fatigue still with little appetite. Denies abdominal pain, nausea or vomiting at present. Denies fever, chills. Has been staying well-hydrated. States he had significant abdominal and peripheral swelling since hospitalization that has improved daily. Notes good output from drain. Denies redness, erythema or tenderness. Wife is performing dressing changes on days when home health is not coming to the home. Patient states he is concerned about how far out the follow-up with specialty is.  Past Medical History:  Diagnosis Date  . Aortic stenosis    mild echocardiogram 8/09 EF 60%, showed no regional wall motion abnml, mild LVH, mod focal basal septal hypertrophy and mild dyastolic dysfunction. 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 mean gradient across aortic valve was 8 mmHG. also mild L atrial  enlargement and normal RV size and function. minimal AS on LHC in 7/10.   . Arthritis    "all over" (07/19/2016)  . BPH (benign prostatic hypertrophy)   . CAD (coronary artery disease)    LCH (7/10) totally occluded proximal RCA with very robuse L to R collaterals, 50% proximal LAD stenosis, EF 65%, medical management.    . Chicken pox   . Chronic lower back pain    s/p surgical fusion  . Diverticulosis   . Esophageal stricture   . GERD (gastroesophageal reflux disease)   . Heart murmur   . Hepatitis B 1984  . Hiatal hernia   . HLD (hyperlipidemia)   . HTN (hypertension)   . 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 Osteoarthritis   . TIA (transient ischemic attack) 1990s   hx  . Tubular adenoma of colon 2009    Current Outpatient Prescriptions on File Prior to Visit  Medication Sig Dispense Refill  . amLODipine (NORVASC) 5 MG tablet TAKE 1 TABLET(5 MG) BY MOUTH DAILY 15 tablet 0  . aspirin 81 MG EC tablet Take 81 mg by mouth at bedtime.     . cefTRIAXone 2 g in dextrose 5 % 50 mL Inject 2 g into the vein daily. Last dose on 08/01/16 16 vial 0  . cholecalciferol (VITAMIN D) 1000 UNITS tablet Take 1,000 Units by mouth daily.     . diphenhydrAMINE (BENADRYL) 25 MG tablet Take 50 mg by mouth every 6 (six) hours as needed for itching or allergies.     . isosorbide mononitrate (IMDUR) 30 MG 24 hr tablet TAKE 1 TABLET BY MOUTH AT BEDTIME (Patient taking differently: TAKE 30 MG BY MOUTH AT BEDTIME) 90 tablet 1  . Omega-3 Fatty Acids (  FISH OIL) 1000 MG CAPS Take 2,000 mg by mouth 2 (two) times daily.     Marland Kitchen omeprazole (PRILOSEC) 20 MG capsule TAKE 1 CAPSULE(20 MG) BY MOUTH DAILY 30 capsule 11   No current facility-administered medications on file prior to visit.     Allergies  Allergen Reactions  . Celecoxib Hives and Itching    Family History  Problem Relation Age of Onset  . Heart disease Father 74       Living  . Coronary artery disease Father        CABG  . Alzheimer's disease Mother 24         Deceased  . Arthritis/Rheumatoid Mother   . Stomach cancer Maternal Uncle   . Brain cancer Maternal Aunt        x2  . Arthritis Brother        DJD  . Obesity Daughter        Had Bypass Sx    Social History   Social History  . Marital status: Married    Spouse name: etta  . Number of children: 2  . Years of education: N/A   Occupational History  . retired Retired    disabled due to back problems   Social History Main Topics  . Smoking status: Former Smoker    Packs/day: 1.50    Years: 50.00    Types: Cigarettes    Quit date: 06/2015  . Smokeless tobacco: Never Used     Comment: smoked less than 1 ppd for 40+ years; Using Vapor Cig  . Alcohol use 0.0 oz/week     Comment: 07/19/2016 "might have a few drinks/year"  . Drug use: No  . Sexual activity: No   Other Topics Concern  . None   Social History Narrative   Married (3rd), [place]. 2 children from 1st marriage, 4 step children.    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       Cell # [id number]   Review of Systems - See HPI.  All other ROS are negative.  BP 122/78   Pulse 68   Temp 98.2 F (36.8 C) (Oral)   Resp 14   Ht '5\' 10"'  (1.778 m)   Wt 187 lb (84.8 kg)   SpO2 97%   BMI 26.83 kg/m   Physical Exam  Constitutional: He is oriented to person, place, and time and well-developed, well-nourished, and in no distress.  HENT:  Head: Normocephalic and atraumatic.  Eyes: Conjunctivae are normal. No scleral icterus.  Neck: Neck supple.  Cardiovascular: Normal rate, regular rhythm, normal heart sounds and intact distal pulses.   Pulmonary/Chest: Effort normal and breath sounds normal. No respiratory distress. He has no wheezes. He has no rales. He exhibits no tenderness.  Abdominal: Soft. Bowel sounds are normal. He exhibits no mass. There is no tenderness. There is no rebound and no guarding.    Mild abdominal  distention. Patient and wife note that this is improved since discharge. No significant tenderness appreciated on examination.  Neurological: He is alert and oriented to person, place, and time.  Skin: Skin is warm and dry. No rash noted.  Psychiatric: Affect normal.  Vitals reviewed.   Recent Results (from the past 2160 hour(s))  Comprehensive metabolic panel     Status: Abnormal   Collection Time: 06/16/16  9:20 AM  Result Value Ref Range   Sodium 136 135 -  145 mmol/L   Potassium 3.3 (L) 3.5 - 5.1 mmol/L   Chloride 104 101 - 111 mmol/L   CO2 25 22 - 32 mmol/L   Glucose, Bld 171 (H) 65 - 99 mg/dL   BUN 9 6 - 20 mg/dL   Creatinine, Ser 0.82 0.61 - 1.24 mg/dL   Calcium 8.6 (L) 8.9 - 10.3 mg/dL   Total Protein 6.7 6.5 - 8.1 g/dL   Albumin 3.4 (L) 3.5 - 5.0 g/dL   AST 21 15 - 41 U/L   ALT 20 17 - 63 U/L   Alkaline Phosphatase 114 38 - 126 U/L   Total Bilirubin 0.6 0.3 - 1.2 mg/dL   GFR calc non Af Amer >60 >60 mL/min   GFR calc Af Amer >60 >60 mL/min    Comment: (NOTE) The eGFR has been calculated using the CKD EPI equation. This calculation has not been validated in all clinical situations. eGFR's persistently <60 mL/min signify possible Chronic Kidney Disease.    Anion gap 7 5 - 15  Lipase, blood     Status: None   Collection Time: 06/16/16  9:20 AM  Result Value Ref Range   Lipase 14 11 - 51 U/L  Troponin I     Status: None   Collection Time: 06/16/16  9:20 AM  Result Value Ref Range   Troponin I <0.03 <0.03 ng/mL  CBC with Differential     Status: Abnormal   Collection Time: 06/16/16  9:20 AM  Result Value Ref Range   WBC 9.8 4.0 - 10.5 K/uL   RBC 4.77 4.22 - 5.81 MIL/uL   Hemoglobin 13.6 13.0 - 17.0 g/dL   HCT 40.4 39.0 - 52.0 %   MCV 84.7 78.0 - 100.0 fL   MCH 28.5 26.0 - 34.0 pg   MCHC 33.7 30.0 - 36.0 g/dL   RDW 13.5 11.5 - 15.5 %   Platelets 143 (L) 150 - 400 K/uL   Neutrophils Relative % 80 %   Neutro Abs 7.9 (H) 1.7 - 7.7 K/uL   Lymphocytes Relative 11  %   Lymphs Abs 1.1 0.7 - 4.0 K/uL   Monocytes Relative 8 %   Monocytes Absolute 0.8 0.1 - 1.0 K/uL   Eosinophils Relative 1 %   Eosinophils Absolute 0.1 0.0 - 0.7 K/uL   Basophils Relative 0 %   Basophils Absolute 0.0 0.0 - 0.1 K/uL  Urinalysis, Routine w reflex microscopic     Status: Abnormal   Collection Time: 06/16/16  9:20 AM  Result Value Ref Range   Color, Urine YELLOW YELLOW   APPearance CLEAR CLEAR   Specific Gravity, Urine 1.020 1.005 - 1.030   pH 5.0 5.0 - 8.0   Glucose, UA 150 (A) NEGATIVE mg/dL   Hgb urine dipstick SMALL (A) NEGATIVE   Bilirubin Urine NEGATIVE NEGATIVE   Ketones, ur NEGATIVE NEGATIVE mg/dL   Protein, ur 30 (A) NEGATIVE mg/dL   Nitrite NEGATIVE NEGATIVE   Leukocytes, UA NEGATIVE NEGATIVE   RBC / HPF 0-5 0 - 5 RBC/hpf   WBC, UA 0-5 0 - 5 WBC/hpf   Bacteria, UA NONE SEEN NONE SEEN   Squamous Epithelial / LPF NONE SEEN NONE SEEN   Mucous PRESENT   Rocky mtn spotted fvr abs pnl(IgG+IgM)     Status: None   Collection Time: 06/16/16  9:20 AM  Result Value Ref Range   RMSF IgG Negative Negative   RMSF IgM 0.42 0.00 - 0.89 index    Comment: (NOTE)  Negative        <0.90                                 Equivocal 0.90 - 1.10                                 Positive        >1.10 Performed At: [facility] [place], [place] [id number] Lindon Romp MD PJ:[id number]   B. Claris Che antibodies     Status: None   Collection Time: 06/16/16  9:20 AM  Result Value Ref Range   B burgdorferi Ab IgG+IgM <0.91 0.00 - 0.90 ISR    Comment: (NOTE)                                Negative         <0.91                                Equivocal  0.91 - 1.09                                Positive         >1.09 Performed At: [facility] [place], [place] [id number] Lindon Romp MD [id number]   I-Stat CG4 Lactic Acid, ED     Status: None   Collection Time: 06/16/16  9:40 AM  Result  Value Ref Range   Lactic Acid, Venous 1.76 0.5 - 1.9 mmol/L  CBC with Differential/Platelet     Status: Abnormal   Collection Time: 07/04/16 10:52 AM  Result Value Ref Range   WBC 8.5 4.0 - 10.5 K/uL   RBC 4.65 4.22 - 5.81 Mil/uL   Hemoglobin 13.0 13.0 - 17.0 g/dL   HCT 39.1 39.0 - 52.0 %   MCV 84.0 78.0 - 100.0 fl   MCHC 33.1 30.0 - 36.0 g/dL   RDW 14.1 11.5 - 15.5 %   Platelets 216.0 150.0 - 400.0 K/uL   Neutrophils Relative % 79.4 (H) 43.0 - 77.0 %   Lymphocytes Relative 13.1 12.0 - 46.0 %   Monocytes Relative 6.6 3.0 - 12.0 %   Eosinophils Relative 0.6 0.0 - 5.0 %   Basophils Relative 0.3 0.0 - 3.0 %   Neutro Abs 6.8 1.4 - 7.7 K/uL   Lymphs Abs 1.1 0.7 - 4.0 K/uL   Monocytes Absolute 0.6 0.1 - 1.0 K/uL   Eosinophils Absolute 0.1 0.0 - 0.7 K/uL   Basophils Absolute 0.0 0.0 - 0.1 K/uL  Comprehensive metabolic panel     Status: Abnormal   Collection Time: 07/04/16 10:52 AM  Result Value Ref Range   Sodium 138 135 - 145 mEq/L   Potassium 4.0 3.5 - 5.1 mEq/L   Chloride 102 96 - 112 mEq/L   CO2 30 19 - 32 mEq/L   Glucose, Bld 122 (H) 70 - 99 mg/dL   BUN 8 6 - 23 mg/dL   Creatinine, Ser 0.76 0.40 - 1.50 mg/dL   Total Bilirubin 0.7 0.2 - 1.2 mg/dL   Alkaline Phosphatase 173 (H) 39 - 117 U/L   AST 17 0 - 37 U/L  ALT 18 0 - 53 U/L   Total Protein 6.4 6.0 - 8.3 g/dL   Albumin 3.6 3.5 - 5.2 g/dL   Calcium 9.0 8.4 - 10.5 mg/dL   GFR 106.63 >60.00 mL/min  TSH     Status: None   Collection Time: 07/04/16 10:52 AM  Result Value Ref Range   TSH 0.95 0.35 - 4.50 uIU/mL  CMV abs, IgG+IgM (cytomegalovirus)     Status: Abnormal   Collection Time: 07/04/16 10:52 AM  Result Value Ref Range   Cytomegalovirus Ab-IgG 6.10 (H) <0.60 U/mL    Comment:        Value         Interpretation      =====         ==============      < 0.60         Negative      0.61-0.69      Equivocal      >= 0.70        Positive   A positive result indicates that the patient has antibody to CMV. It does  not differentiate between an active or past infection.      CMV IgM <30.00 <30.00 AU/mL    Comment:        Value           Interpretation      =====           ==============      < 30.00         Negative      30.00-34.99     Equivocal      >= 35.00        Positive   Results from any one IgM assay should not be used as a sole determinant of a current or recent infection. Because an IgM test can yield false positive results and low level IgM antibody may persist for more than 12 months post infection, reliance on a single test result could be misleading. Acute infection is best diagnosed by demonstrating the conversion of IgG from negative to positive. If an acute infection is suspected, consider obtaining a new specimen and submit for both IgG and IgM testing in two or more weeks.     Epstein-Barr Virus VCA Antibody Panel     Status: Abnormal   Collection Time: 07/04/16 10:52 AM  Result Value Ref Range   EBV VCA IgG >750.00 (H) U/mL    Comment:                                 U/mL         Interpretation                          -------------     --------------                                < 18.00       Negative                          18.00 - 21.99       Equivocal                                >  21.99       Positive      EBV VCA IgM <36.00 U/mL    Comment:                                  U/mL         Interpretation                           -------------     --------------                                 < 36.00       Negative                           36.00 - 43.99       Equivocal                                 > 43.99       Positive      EBV NA IgG 261.00 (H) U/mL    Comment:                                 U/mL         Interpretation                          -------------     --------------                                < 18.00       Negative                          18.00 - 21.99       Equivocal                                > 21.99       Positive       Interpretation SEE NOTE     Comment:   Suggestive of a past Epstein-Barr Virus infection.  In infants, a similar pattern may occur as a result of passive maternal transfer of antibody.     Sed Rate (ESR)     Status: Abnormal   Collection Time: 07/04/16 10:52 AM  Result Value Ref Range   Sed Rate 33 (H) 0 - 20 mm/hr  POCT Urinalysis Dipstick     Status: Abnormal   Collection Time: 07/04/16 10:53 AM  Result Value Ref Range   Color, UA yellow    Clarity, UA clear    Glucose, UA negative    Bilirubin, UA negative    Ketones, UA negative    Spec Grav, UA 1.010 1.010 - 1.025   Blood, UA negative    pH, UA 7.5 5.0 - 8.0   Protein, UA trace    Urobilinogen, UA 1.0 0.2 or 1.0 E.U./dL   Nitrite, UA negative    Leukocytes, UA Negative Negative  Comprehensive metabolic panel  Status: Abnormal   Collection Time: 07/06/16  9:22 PM  Result Value Ref Range   Sodium 137 135 - 145 mmol/L   Potassium 3.3 (L) 3.5 - 5.1 mmol/L   Chloride 102 101 - 111 mmol/L   CO2 25 22 - 32 mmol/L   Glucose, Bld 116 (H) 65 - 99 mg/dL   BUN 12 6 - 20 mg/dL   Creatinine, Ser 0.89 0.61 - 1.24 mg/dL   Calcium 8.5 (L) 8.9 - 10.3 mg/dL   Total Protein 7.1 6.5 - 8.1 g/dL   Albumin 3.1 (L) 3.5 - 5.0 g/dL   AST 28 15 - 41 U/L   ALT 28 17 - 63 U/L   Alkaline Phosphatase 196 (H) 38 - 126 U/L   Total Bilirubin 1.2 0.3 - 1.2 mg/dL   GFR calc non Af Amer >60 >60 mL/min   GFR calc Af Amer >60 >60 mL/min    Comment: (NOTE) The eGFR has been calculated using the CKD EPI equation. This calculation has not been validated in all clinical situations. eGFR's persistently <60 mL/min signify possible Chronic Kidney Disease.    Anion gap 10 5 - 15  CBC with Differential     Status: Abnormal   Collection Time: 07/06/16  9:22 PM  Result Value Ref Range   WBC 13.0 (H) 4.0 - 10.5 K/uL   RBC 4.46 4.22 - 5.81 MIL/uL   Hemoglobin 12.3 (L) 13.0 - 17.0 g/dL   HCT 37.5 (L) 39.0 - 52.0 %   MCV 84.1 78.0 - 100.0 fL   MCH 27.6  26.0 - 34.0 pg   MCHC 32.8 30.0 - 36.0 g/dL   RDW 13.5 11.5 - 15.5 %   Platelets 165 150 - 400 K/uL   Neutrophils Relative % 84 %   Neutro Abs 11.0 (H) 1.7 - 7.7 K/uL   Lymphocytes Relative 7 %   Lymphs Abs 1.0 0.7 - 4.0 K/uL   Monocytes Relative 9 %   Monocytes Absolute 1.1 (H) 0.1 - 1.0 K/uL   Eosinophils Relative 0 %   Eosinophils Absolute 0.0 0.0 - 0.7 K/uL   Basophils Relative 0 %   Basophils Absolute 0.0 0.0 - 0.1 K/uL  Protime-INR     Status: Abnormal   Collection Time: 07/06/16  9:22 PM  Result Value Ref Range   Prothrombin Time 15.7 (H) 11.4 - 15.2 seconds   INR 1.25   Culture, blood (Routine x 2)     Status: None   Collection Time: 07/06/16  9:22 PM  Result Value Ref Range   Specimen Description BLOOD LEFT FOREARM    Special Requests BOTTLES DRAWN AEROBIC AND ANAEROBIC BCAV    Culture      NO GROWTH 5 DAYS Performed at [facility], [address], [place], [place] [address]    Report Status 07/12/2016 FINAL   Culture, blood (Routine x 2)     Status: None   Collection Time: 07/06/16  9:27 PM  Result Value Ref Range   Specimen Description BLOOD RAC    Special Requests BOTTLES DRAWN AEROBIC AND ANAEROBIC BCAV    Culture      NO GROWTH 5 DAYS Performed at [facility], [place] [address], [place], [place] [address]    Report Status 07/12/2016 FINAL   I-Stat CG4 Lactic Acid, ED     Status: None   Collection Time: 07/06/16  9:44 PM  Result Value Ref Range   Lactic Acid, Venous 1.23 0.5 - 1.9 mmol/L  Urinalysis, Routine w reflex microscopic     Status: Abnormal   Collection Time: 07/06/16  9:55 PM  Result Value Ref Range   Color, Urine AMBER (A) YELLOW    Comment: BIOCHEMICALS MAY BE AFFECTED BY COLOR   APPearance CLEAR CLEAR   Specific Gravity, Urine 1.019 1.005 - 1.030   pH 5.0 5.0 - 8.0   Glucose, UA NEGATIVE NEGATIVE mg/dL   Hgb urine dipstick SMALL (A) NEGATIVE   Bilirubin Urine NEGATIVE NEGATIVE   Ketones, ur NEGATIVE NEGATIVE mg/dL   Protein,  ur 30 (A) NEGATIVE mg/dL   Nitrite NEGATIVE NEGATIVE   Leukocytes, UA NEGATIVE NEGATIVE   RBC / HPF 0-5 0 - 5 RBC/hpf   WBC, UA 0-5 0 - 5 WBC/hpf   Bacteria, UA NONE SEEN NONE SEEN   Squamous Epithelial / LPF NONE SEEN NONE SEEN   Mucous PRESENT   Acute Hep Panel & Hep B Surface Ab     Status: None   Collection Time: 07/09/16  3:20 PM  Result Value Ref Range   Hepatitis B Surface Ag NEGATIVE NEGATIVE   Hep B C IgM NON REACTIVE NON REACTIVE    Comment: High levels of Hepatitis B Core IgM antibody are detectable during the acute stage of Hepatitis B. This antibody is used to differentiate current from past HBV infection.      Hep B S Ab NEG NEGATIVE   Hep A IgM NON REACTIVE NON REACTIVE   HCV Ab NEGATIVE NEGATIVE  Rocky mtn spotted fvr abs pnl(IgG+IgM)     Status: None   Collection Time: 07/09/16  3:20 PM  Result Value Ref Range   RMSF IgG NOT DETECTED NOT DETECTED   RMSF IgM NOT DETECTED NOT DETECTED    Comment: Cross-reactivity between the Spotted Fever and Typhus groups is minor. Significant cross reactivity within the Spotted Fever or Typhus group precludes speciation of the rickettsiae.   CBC with Differential/Platelet     Status: Abnormal   Collection Time: 07/09/16  3:20 PM  Result Value Ref Range   WBC 13.4 (H) 4.0 - 10.5 K/uL   RBC 4.30 4.22 - 5.81 Mil/uL   Hemoglobin 11.7 (L) 13.0 - 17.0 g/dL   HCT 35.7 (L) 39.0 - 52.0 %   MCV 83.0 78.0 - 100.0 fl   MCHC 32.7 30.0 - 36.0 g/dL   RDW 15.0 11.5 - 15.5 %   Platelets 115.0 (L) 150.0 - 400.0 K/uL   Neutrophils Relative % 85.6 (H) 43.0 - 77.0 %    Comment: manual diff=76seg,6band,14lymph,20moo   Lymphocytes Relative 10.0 (L) 12.0 - 46.0 %   Monocytes Relative 4.0 3.0 - 12.0 %   Eosinophils Relative 0.3 0.0 - 5.0 %   Basophils Relative 0.1 0.0 - 3.0 %   Neutro Abs 11.5 (H) 1.4 - 7.7 K/uL   Lymphs Abs 1.3 0.7 - 4.0 K/uL   Monocytes Absolute 0.5 0.1 - 1.0 K/uL   Eosinophils Absolute 0.0 0.0 - 0.7 K/uL   Basophils  Absolute 0.0 0.0 - 0.1 K/uL  Hepatic function panel     Status: Abnormal   Collection Time: 07/09/16  3:20 PM  Result Value Ref Range   Total Bilirubin 1.3 (H) 0.2 - 1.2 mg/dL   Bilirubin, Direct 0.8 (H) 0.0 - 0.3 mg/dL   Alkaline Phosphatase 247 (H) 39 - 117 U/L   AST 77 (H) 0 - 37 U/L   ALT 86 (H) 0 - 53 U/L   Total Protein 5.5 (L) 6.0 - 8.3  g/dL   Albumin 2.9 (L) 3.5 - 5.2 g/dL  Antinuclear Antib (ANA)     Status: None   Collection Time: 07/09/16  3:20 PM  Result Value Ref Range   Anit Nuclear Antibody(ANA) NEG NEGATIVE    Comment: Although the specimen was negative for antinuclear antibodies (ANA), the presence of cytoplasmic fluorescence was noted on the HEp-2 slide. Other reactivities (e.g., anti-mitochondrial antibodies or anti-smooth muscle antibodies) may be responsible for this fluorescence.   Lyme Ab/Western Blot Reflex     Status: None   Collection Time: 07/09/16  3:20 PM  Result Value Ref Range   B burgdorferi Ab IgG+IgM <0.90 <0.90 Index    Comment:                      Index                Interpretation                    -----                --------------                    < 0.90               NEGATIVE                    0.90-1.09            EQUIVOCAL                    > 1.09               POSITIVE   As recommended by the Food and Drug Administration (FDA), all samples with positive or equivocal results on the B. burgdorferi antibody screen will be tested by Western Blot/Immunoblot. Positive or equivocal screening test results should not be interpreted as truly positive until verified as such using a supplemental assay (e.g., B. burgdorferi blot).   The screening test and/or blot for B. burgdorferi antibodies may be falsely negative in early stages of Lyme disease, including the period when erythema migrans is apparent.     Comprehensive metabolic panel     Status: Abnormal   Collection Time: 07/10/16  5:35 PM  Result Value Ref Range   Sodium 140 135  - 145 mmol/L   Potassium 3.7 3.5 - 5.1 mmol/L   Chloride 107 101 - 111 mmol/L   CO2 24 22 - 32 mmol/L   Glucose, Bld 120 (H) 65 - 99 mg/dL   BUN 35 (H) 6 - 20 mg/dL   Creatinine, Ser 0.95 0.61 - 1.24 mg/dL   Calcium 7.8 (L) 8.9 - 10.3 mg/dL   Total Protein 5.3 (L) 6.5 - 8.1 g/dL   Albumin 2.1 (L) 3.5 - 5.0 g/dL   AST 47 (H) 15 - 41 U/L   ALT 56 17 - 63 U/L   Alkaline Phosphatase 310 (H) 38 - 126 U/L   Total Bilirubin 1.9 (H) 0.3 - 1.2 mg/dL   GFR calc non Af Amer >60 >60 mL/min   GFR calc Af Amer >60 >60 mL/min    Comment: (NOTE) The eGFR has been calculated using the CKD EPI equation. This calculation has not been validated in all clinical situations. eGFR's persistently <60 mL/min signify possible Chronic Kidney Disease.    Anion gap 9 5 - 15  CBC WITH DIFFERENTIAL  Status: Abnormal   Collection Time: 07/10/16  5:35 PM  Result Value Ref Range   WBC 10.0 4.0 - 10.5 K/uL   RBC 3.77 (L) 4.22 - 5.81 MIL/uL   Hemoglobin 10.3 (L) 13.0 - 17.0 g/dL   HCT 30.9 (L) 39.0 - 52.0 %   MCV 82.0 78.0 - 100.0 fL   MCH 27.3 26.0 - 34.0 pg   MCHC 33.3 30.0 - 36.0 g/dL   RDW 14.7 11.5 - 15.5 %   Platelets 118 (L) 150 - 400 K/uL    Comment: SPECIMEN CHECKED FOR CLOTS REPEATED TO VERIFY PLATELET COUNT CONFIRMED BY SMEAR    Neutrophils Relative % 85 %   Lymphocytes Relative 8 %   Monocytes Relative 7 %   Eosinophils Relative 0 %   Basophils Relative 0 %   Neutro Abs 8.5 (H) 1.7 - 7.7 K/uL   Lymphs Abs 0.8 0.7 - 4.0 K/uL   Monocytes Absolute 0.7 0.1 - 1.0 K/uL   Eosinophils Absolute 0.0 0.0 - 0.7 K/uL   Basophils Absolute 0.0 0.0 - 0.1 K/uL   Smear Review MORPHOLOGY UNREMARKABLE   Blood Culture (routine x 2)     Status: Abnormal   Collection Time: 07/10/16  5:35 PM  Result Value Ref Range   Specimen Description BLOOD LEFT ARM    Special Requests      BOTTLES DRAWN AEROBIC AND ANAEROBIC Blood Culture adequate volume   Culture  Setup Time      GRAM POSITIVE COCCI IN  CHAINS ANAEROBIC BOTTLE ONLY Organism ID to follow CRITICAL RESULT CALLED TO, READ BACK BY AND VERIFIED WITH: J. Scherrie November Pharm.D. 11:15 07/12/16 ([email or web address])    Culture (A)     STREPTOCOCCUS INTERMEDIUS SUSCEPTIBILITIES PERFORMED ON PREVIOUS CULTURE WITHIN THE LAST 5 DAYS. Performed at [facility], [place] [address] [place], [place] [address]    Report Status 07/15/2016 FINAL   Urinalysis, Routine w reflex microscopic     Status: Abnormal   Collection Time: 07/10/16  5:35 PM  Result Value Ref Range   Color, Urine YELLOW YELLOW   APPearance CLEAR CLEAR   Specific Gravity, Urine 1.038 (H) 1.005 - 1.030   pH 6.0 5.0 - 8.0   Glucose, UA NEGATIVE NEGATIVE mg/dL   Hgb urine dipstick NEGATIVE NEGATIVE   Bilirubin Urine NEGATIVE NEGATIVE   Ketones, ur NEGATIVE NEGATIVE mg/dL   Protein, ur NEGATIVE NEGATIVE mg/dL   Nitrite NEGATIVE NEGATIVE   Leukocytes, UA NEGATIVE NEGATIVE  Blood Culture ID Panel (Reflexed)     Status: Abnormal   Collection Time: 07/10/16  5:35 PM  Result Value Ref Range   Enterococcus species NOT DETECTED NOT DETECTED   Listeria monocytogenes NOT DETECTED NOT DETECTED   Staphylococcus species NOT DETECTED NOT DETECTED   Staphylococcus aureus NOT DETECTED NOT DETECTED   Streptococcus species DETECTED (A) NOT DETECTED    Comment: Not Enterococcus species, Streptococcus agalactiae, Streptococcus pyogenes, or Streptococcus pneumoniae. CRITICAL RESULT CALLED TO, READ BACK BY AND VERIFIED WITH: J. Scherrie November Pharm.D. 11:15 07/12/16 ([email or web address])    Streptococcus agalactiae NOT DETECTED NOT DETECTED   Streptococcus pneumoniae NOT DETECTED NOT DETECTED   Streptococcus pyogenes NOT DETECTED NOT DETECTED   Acinetobacter baumannii NOT DETECTED NOT DETECTED   Enterobacteriaceae species NOT DETECTED NOT DETECTED   Enterobacter cloacae complex NOT DETECTED NOT DETECTED   Escherichia coli NOT DETECTED NOT DETECTED   Klebsiella oxytoca NOT DETECTED NOT DETECTED   Klebsiella pneumoniae  NOT DETECTED NOT DETECTED   Proteus species NOT DETECTED NOT  DETECTED   Serratia marcescens NOT DETECTED NOT DETECTED   Haemophilus influenzae NOT DETECTED NOT DETECTED   Neisseria meningitidis NOT DETECTED NOT DETECTED   Pseudomonas aeruginosa NOT DETECTED NOT DETECTED   Candida albicans NOT DETECTED NOT DETECTED   Candida glabrata NOT DETECTED NOT DETECTED   Candida krusei NOT DETECTED NOT DETECTED   Candida parapsilosis NOT DETECTED NOT DETECTED   Candida tropicalis NOT DETECTED NOT DETECTED    Comment: Performed at [facility], [place] [address] [place], [place] [address]  Blood Culture (routine x 2)     Status: Abnormal   Collection Time: 07/10/16  5:43 PM  Result Value Ref Range   Specimen Description BLOOD RIGHT HAND    Special Requests      BOTTLES DRAWN AEROBIC AND ANAEROBIC Blood Culture adequate volume   Culture  Setup Time      GRAM POSITIVE COCCI IN CHAINS ANAEROBIC BOTTLE ONLY CRITICAL VALUE NOTED.  VALUE IS CONSISTENT WITH PREVIOUSLY REPORTED AND CALLED VALUE. Performed at [facility], [place] [address] [place], [place] [address]    Culture STREPTOCOCCUS INTERMEDIUS (A)    Report Status 07/15/2016 FINAL    Organism ID, Bacteria STREPTOCOCCUS INTERMEDIUS       Susceptibility   Streptococcus intermedius - MIC*    PENICILLIN <=0.06 SENSITIVE Sensitive     CEFTRIAXONE 0.25 SENSITIVE Sensitive     ERYTHROMYCIN >=8 RESISTANT Resistant     LEVOFLOXACIN <=0.25 SENSITIVE Sensitive     VANCOMYCIN 0.5 SENSITIVE Sensitive     * STREPTOCOCCUS INTERMEDIUS  I-Stat CG4 Lactic Acid, ED  (not at  [facility])     Status: None   Collection Time: 07/10/16  5:46 PM  Result Value Ref Range   Lactic Acid, Venous 1.31 0.5 - 1.9 mmol/L  Vitamin B12     Status: Abnormal   Collection Time: 07/10/16 10:55 PM  Result Value Ref Range   Vitamin B-12 4,419 (H) 180 - 914 pg/mL    Comment: (NOTE) This assay is not validated for testing neonatal or myeloproliferative syndrome specimens  for Vitamin B12 levels. Performed at [facility], [place] [address], [place], [place] [address]   Folate     Status: None   Collection Time: 07/10/16 10:55 PM  Result Value Ref Range   Folate 7.4 >5.9 ng/mL    Comment: Performed at [facility], [place] [address] [place], [place] [address]  Iron and TIBC     Status: Abnormal   Collection Time: 07/10/16 10:55 PM  Result Value Ref Range   Iron 6 (L) 45 - 182 ug/dL   TIBC 134 (L) 250 - 450 ug/dL   Saturation Ratios 4 (L) 17.9 - 39.5 %   UIBC 128 ug/dL    Comment: Performed at [place] [address], [place], [place] [address]  Ferritin     Status: Abnormal   Collection Time: 07/10/16 10:55 PM  Result Value Ref Range   Ferritin 499 (H) 24 - 336 ng/mL    Comment: Performed at [facility], [place] [address], [place], [place] [address]  Reticulocytes     Status: Abnormal   Collection Time: 07/10/16 10:55 PM  Result Value Ref Range   Retic Ct Pct 0.5 0.4 - 3.1 %   RBC. 3.82 (L) 4.22 - 5.81 MIL/uL   Retic Count, Manual 19.1 19.0 - 186.0 K/uL  E. histolytica antibody (amoeba ab)     Status: None   Collection Time:  07/10/16 10:55 PM  Result Value Ref Range   E histolytica Ab Negative Negative    Comment: (NOTE) Performed At: [facility] [place], [place] [id number] Lindon Romp MD [id number]   Strep pneumoniae urinary antigen     Status: None   Collection Time: 07/10/16 11:47 PM  Result Value Ref Range   Strep Pneumo Urinary Antigen NEGATIVE NEGATIVE    Comment:        Infection due to S. pneumoniae cannot be absolutely ruled out since the antigen present may be below the detection limit of the test. Performed at [facility], [address], [place], [place] [address]   CBC with Differential/Platelet     Status: Abnormal   Collection Time: 07/11/16  5:17 AM  Result Value Ref Range   WBC 11.5 (H) 4.0 - 10.5 K/uL   RBC 3.69 (L) 4.22 - 5.81 MIL/uL   Hemoglobin 10.1  (L) 13.0 - 17.0 g/dL   HCT 30.6 (L) 39.0 - 52.0 %   MCV 82.9 78.0 - 100.0 fL   MCH 27.4 26.0 - 34.0 pg   MCHC 33.0 30.0 - 36.0 g/dL   RDW 14.9 11.5 - 15.5 %   Platelets 95 (L) 150 - 400 K/uL    Comment: CONSISTENT WITH PREVIOUS RESULT   Neutrophils Relative % 86 %   Neutro Abs 9.9 (H) 1.7 - 7.7 K/uL   Lymphocytes Relative 7 %   Lymphs Abs 0.8 0.7 - 4.0 K/uL   Monocytes Relative 7 %   Monocytes Absolute 0.7 0.1 - 1.0 K/uL   Eosinophils Relative 0 %   Eosinophils Absolute 0.0 0.0 - 0.7 K/uL   Basophils Relative 0 %   Basophils Absolute 0.0 0.0 - 0.1 K/uL  Protime-INR     Status: Abnormal   Collection Time: 07/11/16  5:17 AM  Result Value Ref Range   Prothrombin Time 17.3 (H) 11.4 - 15.2 seconds   INR 1.41   Comprehensive metabolic panel     Status: Abnormal   Collection Time: 07/11/16  5:17 AM  Result Value Ref Range   Sodium 141 135 - 145 mmol/L   Potassium 3.6 3.5 - 5.1 mmol/L   Chloride 111 101 - 111 mmol/L   CO2 24 22 - 32 mmol/L   Glucose, Bld 91 65 - 99 mg/dL   BUN 27 (H) 6 - 20 mg/dL   Creatinine, Ser 0.79 0.61 - 1.24 mg/dL   Calcium 7.4 (L) 8.9 - 10.3 mg/dL   Total Protein 5.0 (L) 6.5 - 8.1 g/dL   Albumin 1.8 (L) 3.5 - 5.0 g/dL   AST 49 (H) 15 - 41 U/L   ALT 52 17 - 63 U/L   Alkaline Phosphatase 286 (H) 38 - 126 U/L   Total Bilirubin 2.1 (H) 0.3 - 1.2 mg/dL   GFR calc non Af Amer >60 >60 mL/min   GFR calc Af Amer >60 >60 mL/min    Comment: (NOTE) The eGFR has been calculated using the CKD EPI equation. This calculation has not been validated in all clinical situations. eGFR's persistently <60 mL/min signify possible Chronic Kidney Disease.    Anion gap 6 5 - 15  Aerobic/Anaerobic Culture (surgical/deep wound)     Status: None   Collection Time: 07/11/16  5:22 PM  Result Value Ref Range   Specimen Description ABSCESS HEPATIC    Special Requests NONE    Gram Stain      ABUNDANT WBC PRESENT, PREDOMINANTLY PMN ABUNDANT GRAM  POSITIVE COCCI IN PAIRS     Culture      ABUNDANT STREPTOCOCCUS INTERMEDIUS NO ANAEROBES ISOLATED Performed at [facility], [place] [address], [place], [place] [address]    Report Status 07/16/2016 FINAL    Organism ID, Bacteria STREPTOCOCCUS INTERMEDIUS       Susceptibility   Streptococcus intermedius - MIC*    PENICILLIN <=0.06 SENSITIVE Sensitive     CEFTRIAXONE <=0.12 SENSITIVE Sensitive     ERYTHROMYCIN >=8 RESISTANT Resistant     LEVOFLOXACIN <=0.25 SENSITIVE Sensitive     VANCOMYCIN 0.25 SENSITIVE Sensitive     * ABUNDANT STREPTOCOCCUS INTERMEDIUS  MRSA PCR Screening     Status: None   Collection Time: 07/11/16  6:54 PM  Result Value Ref Range   MRSA by PCR NEGATIVE NEGATIVE    Comment:        The GeneXpert MRSA Assay (FDA approved for NASAL specimens only), is one component of a comprehensive MRSA colonization surveillance program. It is not intended to diagnose MRSA infection nor to guide or monitor treatment for MRSA infections.   Comprehensive metabolic panel     Status: Abnormal   Collection Time: 07/12/16  8:59 AM  Result Value Ref Range   Sodium 142 135 - 145 mmol/L   Potassium 3.3 (L) 3.5 - 5.1 mmol/L   Chloride 111 101 - 111 mmol/L   CO2 26 22 - 32 mmol/L   Glucose, Bld 110 (H) 65 - 99 mg/dL   BUN 29 (H) 6 - 20 mg/dL   Creatinine, Ser 0.89 0.61 - 1.24 mg/dL   Calcium 7.9 (L) 8.9 - 10.3 mg/dL   Total Protein 5.2 (L) 6.5 - 8.1 g/dL   Albumin 1.9 (L) 3.5 - 5.0 g/dL   AST 41 15 - 41 U/L   ALT 45 17 - 63 U/L   Alkaline Phosphatase 309 (H) 38 - 126 U/L   Total Bilirubin 1.8 (H) 0.3 - 1.2 mg/dL   GFR calc non Af Amer >60 >60 mL/min   GFR calc Af Amer >60 >60 mL/min    Comment: (NOTE) The eGFR has been calculated using the CKD EPI equation. This calculation has not been validated in all clinical situations. eGFR's persistently <60 mL/min signify possible Chronic Kidney Disease.    Anion gap 5 5 - 15  CBC     Status: Abnormal   Collection Time: 07/12/16  8:59 AM  Result Value  Ref Range   WBC 11.8 (H) 4.0 - 10.5 K/uL   RBC 3.61 (L) 4.22 - 5.81 MIL/uL   Hemoglobin 9.9 (L) 13.0 - 17.0 g/dL   HCT 30.3 (L) 39.0 - 52.0 %   MCV 83.9 78.0 - 100.0 fL   MCH 27.4 26.0 - 34.0 pg   MCHC 32.7 30.0 - 36.0 g/dL   RDW 15.2 11.5 - 15.5 %   Platelets 114 (L) 150 - 400 K/uL    Comment: CONSISTENT WITH PREVIOUS RESULT  Ammonia     Status: Abnormal   Collection Time: 07/12/16  2:57 PM  Result Value Ref Range   Ammonia 39 (H) 9 - 35 umol/L  Comprehensive metabolic panel     Status: Abnormal   Collection Time: 07/13/16  3:20 AM  Result Value Ref Range   Sodium 140 135 - 145 mmol/L   Potassium 3.5 3.5 - 5.1 mmol/L   Chloride 111 101 - 111 mmol/L   CO2 23 22 - 32 mmol/L   Glucose, Bld 113 (H) 65 - 99 mg/dL  BUN 21 (H) 6 - 20 mg/dL   Creatinine, Ser 0.75 0.61 - 1.24 mg/dL   Calcium 7.5 (L) 8.9 - 10.3 mg/dL   Total Protein 4.6 (L) 6.5 - 8.1 g/dL   Albumin 1.7 (L) 3.5 - 5.0 g/dL   AST 32 15 - 41 U/L   ALT 35 17 - 63 U/L   Alkaline Phosphatase 404 (H) 38 - 126 U/L   Total Bilirubin 1.3 (H) 0.3 - 1.2 mg/dL   GFR calc non Af Amer >60 >60 mL/min   GFR calc Af Amer >60 >60 mL/min    Comment: (NOTE) The eGFR has been calculated using the CKD EPI equation. This calculation has not been validated in all clinical situations. eGFR's persistently <60 mL/min signify possible Chronic Kidney Disease.    Anion gap 6 5 - 15  CBC     Status: Abnormal   Collection Time: 07/13/16  3:20 AM  Result Value Ref Range   WBC 10.5 4.0 - 10.5 K/uL   RBC 3.47 (L) 4.22 - 5.81 MIL/uL   Hemoglobin 9.5 (L) 13.0 - 17.0 g/dL   HCT 28.7 (L) 39.0 - 52.0 %   MCV 82.7 78.0 - 100.0 fL   MCH 27.4 26.0 - 34.0 pg   MCHC 33.1 30.0 - 36.0 g/dL   RDW 15.3 11.5 - 15.5 %   Platelets 127 (L) 150 - 400 K/uL  Comprehensive metabolic panel     Status: Abnormal   Collection Time: 07/14/16  8:02 AM  Result Value Ref Range   Sodium 140 135 - 145 mmol/L   Potassium 3.5 3.5 - 5.1 mmol/L   Chloride 108 101 - 111  mmol/L   CO2 24 22 - 32 mmol/L   Glucose, Bld 96 65 - 99 mg/dL   BUN 16 6 - 20 mg/dL   Creatinine, Ser 0.58 (L) 0.61 - 1.24 mg/dL   Calcium 7.6 (L) 8.9 - 10.3 mg/dL   Total Protein 4.7 (L) 6.5 - 8.1 g/dL   Albumin 1.7 (L) 3.5 - 5.0 g/dL   AST 23 15 - 41 U/L   ALT 26 17 - 63 U/L   Alkaline Phosphatase 435 (H) 38 - 126 U/L   Total Bilirubin 1.5 (H) 0.3 - 1.2 mg/dL   GFR calc non Af Amer >60 >60 mL/min   GFR calc Af Amer >60 >60 mL/min    Comment: (NOTE) The eGFR has been calculated using the CKD EPI equation. This calculation has not been validated in all clinical situations. eGFR's persistently <60 mL/min signify possible Chronic Kidney Disease.    Anion gap 8 5 - 15  Comprehensive metabolic panel     Status: Abnormal   Collection Time: 07/15/16  6:15 AM  Result Value Ref Range   Sodium 140 135 - 145 mmol/L   Potassium 3.6 3.5 - 5.1 mmol/L   Chloride 105 101 - 111 mmol/L   CO2 26 22 - 32 mmol/L   Glucose, Bld 101 (H) 65 - 99 mg/dL   BUN 16 6 - 20 mg/dL   Creatinine, Ser 0.65 0.61 - 1.24 mg/dL   Calcium 7.7 (L) 8.9 - 10.3 mg/dL   Total Protein 4.8 (L) 6.5 - 8.1 g/dL   Albumin 1.7 (L) 3.5 - 5.0 g/dL   AST 18 15 - 41 U/L   ALT 20 17 - 63 U/L   Alkaline Phosphatase 427 (H) 38 - 126 U/L   Total Bilirubin 1.2 0.3 - 1.2 mg/dL   GFR calc non Af Amer >  60 >60 mL/min   GFR calc Af Amer >60 >60 mL/min    Comment: (NOTE) The eGFR has been calculated using the CKD EPI equation. This calculation has not been validated in all clinical situations. eGFR's persistently <60 mL/min signify possible Chronic Kidney Disease.    Anion gap 9 5 - 15  CBC     Status: Abnormal   Collection Time: 07/18/16 12:46 PM  Result Value Ref Range   WBC 6.7 4.0 - 10.5 K/uL   RBC 3.45 (L) 4.22 - 5.81 Mil/uL   Platelets 233.0 150.0 - 400.0 K/uL   Hemoglobin 9.4 (L) 13.0 - 17.0 g/dL   HCT 29.4 (L) 39.0 - 52.0 %   MCV 85.1 78.0 - 100.0 fl   MCHC 32.1 30.0 - 36.0 g/dL   RDW 16.1 (H) 11.5 - 15.5 %  Comp  Met (CMET)     Status: Abnormal   Collection Time: 07/18/16 12:46 PM  Result Value Ref Range   Sodium 139 135 - 145 mEq/L   Potassium 4.0 3.5 - 5.1 mEq/L   Chloride 104 96 - 112 mEq/L   CO2 31 19 - 32 mEq/L   Glucose, Bld 115 (H) 70 - 99 mg/dL   BUN 9 6 - 23 mg/dL   Creatinine, Ser 0.51 0.40 - 1.50 mg/dL   Total Bilirubin 0.8 0.2 - 1.2 mg/dL   Alkaline Phosphatase 425 (H) 39 - 117 U/L   AST 19 0 - 37 U/L   ALT 11 0 - 53 U/L   Total Protein 5.0 (L) 6.0 - 8.3 g/dL   Albumin 2.1 (L) 3.5 - 5.2 g/dL   Calcium 7.6 (L) 8.4 - 10.5 mg/dL   GFR 168.96 >60.00 mL/min  PH, Body Fluid     Status: None   Collection Time: 07/19/16  4:14 PM  Result Value Ref Range   pH, Body Fluid 8.0 Not Estab.    Comment: (NOTE) This test was developed and its performance characteristics determined by [facility]. It has not been cleared or approved by the Food and Drug Administration. Performed At: [facility] [place], [place] [id number] Lindon Romp MD [id number]    Source of Sample PLEU RIGHT   Protein, pleural or peritoneal fluid     Status: None   Collection Time: 07/19/16  4:14 PM  Result Value Ref Range   Total protein, fluid <3.0 g/dL    Comment: (NOTE) No normal range established for this test Results should be evaluated in conjunction with serum values    Fluid Type-FTP PLEU RIGHT   Body fluid cell count with differential     Status: Abnormal   Collection Time: 07/19/16  4:24 PM  Result Value Ref Range   Fluid Type-FCT FLUID     Comment: RIGHT PLEURAL CORRECTED ON 06/14 AT 1629: PREVIOUSLY REPORTED AS Pleural R    Color, Fluid ORANGE (A) YELLOW   Appearance, Fluid CLOUDY (A) CLEAR   WBC, Fluid 10,250 (H) 0 - 1,000 cu mm   Neutrophil Count, Fluid 82 (H) 0 - 25 %   Lymphs, Fluid 4 %   Monocyte-Macrophage-Serous Fluid 14 (L) 50 - 90 %   Eos, Fluid 0 %   Other Cells, Fluid 0 %  Culture, body fluid-bottle     Status: None (Preliminary result)   Collection  Time: 07/19/16  4:24 PM  Result Value Ref Range   Specimen Description PLEURAL    Special Requests RIGHT PLEURAL    Culture NO GROWTH 4 DAYS  Report Status PENDING   Gram stain     Status: None   Collection Time: 07/19/16  4:24 PM  Result Value Ref Range   Specimen Description PLEURAL    Special Requests RIGHT PLEURAL    Gram Stain      ABUNDANT WBC PRESENT, PREDOMINANTLY PMN NO ORGANISMS SEEN    Report Status 07/19/2016 FINAL   Pathologist smear review     Status: None   Collection Time: 07/19/16  4:24 PM  Result Value Ref Range   Path Review Mixed inflammatory cells, predominantly     Comment: neutrophils. Reviewed by Lennox Solders Lyndon Code, M.D. 07/20/2016   Lactate dehydrogenase, body fluid     Status: Abnormal   Collection Time: 07/19/16  4:24 PM  Result Value Ref Range   LD, Fluid 149 (H) 3 - 23 U/L    Comment: (NOTE) Results should be evaluated in conjunction with serum values    Fluid Type-FLDH PLEU RIGHT     Comment: CORRECTED ON 06/15 AT 1300: PREVIOUSLY REPORTED AS Pleural R  Glucose, pleural or peritoneal fluid     Status: None   Collection Time: 07/19/16  4:24 PM  Result Value Ref Range   Glucose, Fluid 113 mg/dL    Comment: (NOTE) No normal range established for this test Results should be evaluated in conjunction with serum values    Fluid Type-FGLU PLEU RIGHT     Comment: CORRECTED ON 06/15 AT 1300: PREVIOUSLY REPORTED AS Pleural R  CBC with Differential/Platelet     Status: Abnormal   Collection Time: 07/19/16  6:18 PM  Result Value Ref Range   WBC 6.4 4.0 - 10.5 K/uL   RBC 3.38 (L) 4.22 - 5.81 MIL/uL   Hemoglobin 9.1 (L) 13.0 - 17.0 g/dL   HCT 28.9 (L) 39.0 - 52.0 %   MCV 85.5 78.0 - 100.0 fL   MCH 26.9 26.0 - 34.0 pg   MCHC 31.5 30.0 - 36.0 g/dL   RDW 16.4 (H) 11.5 - 15.5 %   Platelets 252 150 - 400 K/uL   Neutrophils Relative % 71 %   Neutro Abs 4.6 1.7 - 7.7 K/uL   Lymphocytes Relative 23 %   Lymphs Abs 1.5 0.7 - 4.0 K/uL   Monocytes Relative 4  %   Monocytes Absolute 0.3 0.1 - 1.0 K/uL   Eosinophils Relative 1 %   Eosinophils Absolute 0.0 0.0 - 0.7 K/uL   Basophils Relative 1 %   Basophils Absolute 0.0 0.0 - 0.1 K/uL  Comprehensive metabolic panel     Status: Abnormal   Collection Time: 07/19/16  6:18 PM  Result Value Ref Range   Sodium 136 135 - 145 mmol/L   Potassium 3.5 3.5 - 5.1 mmol/L   Chloride 102 101 - 111 mmol/L   CO2 26 22 - 32 mmol/L   Glucose, Bld 107 (H) 65 - 99 mg/dL   BUN 5 (L) 6 - 20 mg/dL   Creatinine, Ser 0.54 (L) 0.61 - 1.24 mg/dL   Calcium 7.5 (L) 8.9 - 10.3 mg/dL   Total Protein 5.6 (L) 6.5 - 8.1 g/dL   Albumin 1.8 (L) 3.5 - 5.0 g/dL   AST 24 15 - 41 U/L   ALT 15 (L) 17 - 63 U/L   Alkaline Phosphatase 359 (H) 38 - 126 U/L   Total Bilirubin 0.9 0.3 - 1.2 mg/dL   GFR calc non Af Amer >60 >60 mL/min   GFR calc Af Amer >60 >60 mL/min    Comment: (  NOTE) The eGFR has been calculated using the CKD EPI equation. This calculation has not been validated in all clinical situations. eGFR's persistently <60 mL/min signify possible Chronic Kidney Disease.    Anion gap 8 5 - 15  Protime-INR     Status: Abnormal   Collection Time: 07/19/16  6:18 PM  Result Value Ref Range   Prothrombin Time 16.1 (H) 11.4 - 15.2 seconds   INR 1.29   Culture, blood (routine x 2)     Status: None (Preliminary result)   Collection Time: 07/19/16  6:18 PM  Result Value Ref Range   Specimen Description BLOOD LEFT ANTECUBITAL    Special Requests      BOTTLES DRAWN AEROBIC AND ANAEROBIC Blood Culture adequate volume   Culture NO GROWTH 4 DAYS    Report Status PENDING   Culture, blood (routine x 2)     Status: None (Preliminary result)   Collection Time: 07/19/16  6:18 PM  Result Value Ref Range   Specimen Description BLOOD RIGHT HAND    Special Requests      BOTTLES DRAWN AEROBIC AND ANAEROBIC Blood Culture adequate volume   Culture NO GROWTH 4 DAYS    Report Status PENDING   Basic metabolic panel     Status: Abnormal    Collection Time: 07/20/16  3:10 AM  Result Value Ref Range   Sodium 134 (L) 135 - 145 mmol/L   Potassium 3.5 3.5 - 5.1 mmol/L   Chloride 101 101 - 111 mmol/L   CO2 26 22 - 32 mmol/L   Glucose, Bld 100 (H) 65 - 99 mg/dL   BUN <5 (L) 6 - 20 mg/dL   Creatinine, Ser 0.61 0.61 - 1.24 mg/dL   Calcium 7.5 (L) 8.9 - 10.3 mg/dL   GFR calc non Af Amer >60 >60 mL/min   GFR calc Af Amer >60 >60 mL/min    Comment: (NOTE) The eGFR has been calculated using the CKD EPI equation. This calculation has not been validated in all clinical situations. eGFR's persistently <60 mL/min signify possible Chronic Kidney Disease.    Anion gap 7 5 - 15  CBC     Status: Abnormal   Collection Time: 07/20/16  3:10 AM  Result Value Ref Range   WBC 6.0 4.0 - 10.5 K/uL   RBC 3.01 (L) 4.22 - 5.81 MIL/uL   Hemoglobin 8.1 (L) 13.0 - 17.0 g/dL   HCT 25.9 (L) 39.0 - 52.0 %   MCV 86.0 78.0 - 100.0 fL   MCH 26.9 26.0 - 34.0 pg   MCHC 31.3 30.0 - 36.0 g/dL   RDW 16.7 (H) 11.5 - 15.5 %   Platelets 227 150 - 400 K/uL  Vitamin B12     Status: Abnormal   Collection Time: 07/20/16  8:42 AM  Result Value Ref Range   Vitamin B-12 3,488 (H) 180 - 914 pg/mL    Comment: (NOTE) This assay is not validated for testing neonatal or myeloproliferative syndrome specimens for Vitamin B12 levels.   Folate     Status: None   Collection Time: 07/20/16  8:42 AM  Result Value Ref Range   Folate 9.0 >5.9 ng/mL  Iron and TIBC     Status: Abnormal   Collection Time: 07/20/16  8:42 AM  Result Value Ref Range   Iron 10 (L) 45 - 182 ug/dL   TIBC 143 (L) 250 - 450 ug/dL   Saturation Ratios 7 (L) 17.9 - 39.5 %   UIBC 133 ug/dL  Ferritin     Status: None   Collection Time: 07/20/16  8:42 AM  Result Value Ref Range   Ferritin 307 24 - 336 ng/mL  Lactate dehydrogenase     Status: None   Collection Time: 07/20/16 12:27 PM  Result Value Ref Range   LDH 161 98 - 192 U/L  CBC with Differential/Platelet     Status: Abnormal   Collection  Time: 07/21/16  3:30 AM  Result Value Ref Range   WBC 7.4 4.0 - 10.5 K/uL   RBC 2.99 (L) 4.22 - 5.81 MIL/uL   Hemoglobin 8.1 (L) 13.0 - 17.0 g/dL   HCT 25.7 (L) 39.0 - 52.0 %   MCV 86.0 78.0 - 100.0 fL   MCH 27.1 26.0 - 34.0 pg   MCHC 31.5 30.0 - 36.0 g/dL   RDW 16.7 (H) 11.5 - 15.5 %   Platelets 233 150 - 400 K/uL   Neutrophils Relative % 76 %   Neutro Abs 5.6 1.7 - 7.7 K/uL   Lymphocytes Relative 16 %   Lymphs Abs 1.2 0.7 - 4.0 K/uL   Monocytes Relative 7 %   Monocytes Absolute 0.5 0.1 - 1.0 K/uL   Eosinophils Relative 1 %   Eosinophils Absolute 0.0 0.0 - 0.7 K/uL   Basophils Relative 0 %   Basophils Absolute 0.0 0.0 - 0.1 K/uL  Comprehensive metabolic panel     Status: Abnormal   Collection Time: 07/21/16  3:30 AM  Result Value Ref Range   Sodium 135 135 - 145 mmol/L   Potassium 4.1 3.5 - 5.1 mmol/L   Chloride 101 101 - 111 mmol/L   CO2 28 22 - 32 mmol/L   Glucose, Bld 101 (H) 65 - 99 mg/dL   BUN 5 (L) 6 - 20 mg/dL   Creatinine, Ser 0.73 0.61 - 1.24 mg/dL   Calcium 7.6 (L) 8.9 - 10.3 mg/dL   Total Protein 5.2 (L) 6.5 - 8.1 g/dL   Albumin 1.6 (L) 3.5 - 5.0 g/dL   AST 28 15 - 41 U/L   ALT 14 (L) 17 - 63 U/L   Alkaline Phosphatase 308 (H) 38 - 126 U/L   Total Bilirubin 0.9 0.3 - 1.2 mg/dL   GFR calc non Af Amer >60 >60 mL/min   GFR calc Af Amer >60 >60 mL/min    Comment: (NOTE) The eGFR has been calculated using the CKD EPI equation. This calculation has not been validated in all clinical situations. eGFR's persistently <60 mL/min signify possible Chronic Kidney Disease.    Anion gap 6 5 - 15    Assessment/Plan: 1. Liver abscess Drain in place. Good drainage at present. Peripheral edema and abdominal swelling (mild) noted which is initially concerning. Patient and wife both endorse this has improved daily since discharge. Repeat labs today. Discussed proper care of drain and area of insertion. Discussed that if swelling is not improving daily or there is any  worsened swelling or decreased output in the drain, to return to ER immediately. Discussed with them recommendation for ER assessment today giving the abdominal swelling but patient declines. Follow-up with IR will be moved up if possible.  - CBC - Comp Met (CMET)  2. Streptococcal sepsis (HCC) Clinical Resolution. Infection still being treated with Rocephin through PICC line. Strict ER precautions given to patient and wife who voice understanding and agreement with the plan.  3. Thrombocytopenia (HCC) Repeat CBC to ensure resolution.   4. Essential hypertension BP stable. Continue current regimen. No indication  to restart ACEI. Repeat labs today.   Leeanne Rio, PA-C

## 2016-07-23 NOTE — Telephone Encounter (Signed)
Transition Care Management Follow-up Telephone Call   Date discharged? 07/21/2016   How have you been since you were released from the hospital? "lowsy"   Do you understand why you were in the hospital? yes, "Reposition and flush my drain tube. Also had fluid on my lungs"   Do you understand the discharge instructions? yes   Where were you discharged to? Home. Lives with wife.    Items Reviewed:  Medications reviewed: yes. Medications reviewed, unable to mark as reviewed d/t Heart/Vascular clinic having chart opened.   Allergies reviewed: yes  Dietary changes reviewed: yes, no changes  Referrals reviewed: yes. Dr. Aundra Dubin reviewing chart/case for f/u appt. [facility] RN to visit on 07/24/2016 per patient.    Functional Questionnaire:   Activities of Daily Living (ADLs):   He states they are independent in the following: ambulation, bathing and hygiene, feeding, continence, grooming, toileting and dressing. Patient does have assistance from wife due to PICC line and drainage tube.  States they require assistance with the following: None   Any transportation issues/concerns?: no   Any patient concerns? no   Confirmed importance and date/time of follow-up visits scheduled yes  Provider Appointment booked with Elyn Aquas, PA-C on Tuesday, 07/31/2016 @ 11:30.   Confirmed with patient if condition begins to worsen call PCP or go to the ER.  Patient was given the office number and encouraged to call back with question or concerns.  : yes

## 2016-07-23 NOTE — Telephone Encounter (Signed)
Refill Amlodipine sent to [facility] pt has appt w/ [facility] 08/06/16

## 2016-07-23 NOTE — Telephone Encounter (Signed)
I have ordered Echo and sending message to [facility] to have patient scheduled.

## 2016-07-24 LAB — CULTURE, BODY FLUID-BOTTLE: CULTURE: NO GROWTH

## 2016-07-24 LAB — CULTURE, BLOOD (ROUTINE X 2)
CULTURE: NO GROWTH
Culture: NO GROWTH
Special Requests: ADEQUATE
Special Requests: ADEQUATE

## 2016-07-25 ENCOUNTER — Other Ambulatory Visit (HOSPITAL_COMMUNITY): Payer: Self-pay | Admitting: Diagnostic Radiology

## 2016-07-25 ENCOUNTER — Ambulatory Visit (HOSPITAL_COMMUNITY)
Admission: RE | Admit: 2016-07-25 | Discharge: 2016-07-25 | Disposition: A | Discharge status: Home / Self Care | Payer: Medicare Other | Source: Ambulatory Visit | Attending: Diagnostic Radiology | Admitting: Diagnostic Radiology

## 2016-07-25 ENCOUNTER — Encounter (HOSPITAL_COMMUNITY): Payer: Self-pay

## 2016-07-25 DIAGNOSIS — Z7982 Long term (current) use of aspirin: Secondary | ICD-10-CM | POA: Diagnosis not present

## 2016-07-25 DIAGNOSIS — Z79899 Other long term (current) drug therapy: Secondary | ICD-10-CM | POA: Insufficient documentation

## 2016-07-25 DIAGNOSIS — K750 Abscess of liver: Secondary | ICD-10-CM

## 2016-07-25 DIAGNOSIS — I700 Atherosclerosis of aorta: Secondary | ICD-10-CM | POA: Diagnosis not present

## 2016-07-25 DIAGNOSIS — J90 Pleural effusion, not elsewhere classified: Secondary | ICD-10-CM | POA: Insufficient documentation

## 2016-07-25 DIAGNOSIS — Z888 Allergy status to other drugs, medicaments and biological substances status: Secondary | ICD-10-CM | POA: Insufficient documentation

## 2016-07-25 DIAGNOSIS — I714 Abdominal aortic aneurysm, without rupture: Secondary | ICD-10-CM | POA: Insufficient documentation

## 2016-07-25 DIAGNOSIS — R109 Unspecified abdominal pain: Secondary | ICD-10-CM | POA: Diagnosis not present

## 2016-07-25 DIAGNOSIS — A409 Streptococcal sepsis, unspecified: Secondary | ICD-10-CM | POA: Insufficient documentation

## 2016-07-25 MED ORDER — IOPAMIDOL (ISOVUE-300) INJECTION 61%
INTRAVENOUS | Status: AC
  Administered 2016-07-25: 100 mL
  Filled 2016-07-25: qty 100

## 2016-07-25 NOTE — Progress Notes (Signed)
Patient ID: Brandon [name], male   DOB: 1940/02/21, 76 y.o.   MRN: [id number]    Referring Physician(s): Henn,Adam  Supervising Physician: Corrie Mckusick  Patient Status: [facility] - Out-pt  Chief Complaint: Liver abscess  Subjective: Patient called our office to say that he was having horrible pain around his drain.  He is not having much output at all from the drain.  It was just repositioned on Friday.  He is still on IV abx therapy for this abscess.  [facility] was referred here [facility] for [facility] CT and a drain evaluation.  Allergies: Celecoxib  Medications: Prior to Admission medications   Medication Sig Start Date End Date Taking? Authorizing Provider  amLODipine (NORVASC) 5 MG tablet TAKE 1 TABLET(5 MG) BY MOUTH DAILY 06/25/16   Larey Dresser, MD  aspirin 81 MG EC tablet Take 81 mg by mouth at bedtime.     [provider]  cefTRIAXone (ROCEPHIN) IVPB Inject 2 g into the vein daily. Indication:  Strep intermedius bacteremia Last Day of Therapy:  08/01/2016 Labs - Once weekly:  CBC/D and BMP, Labs - Every other week:  ESR and CRP 07/21/16 08/02/16  Eugenie Filler, MD  cefTRIAXone 2 g in dextrose 5 % 50 mL Inject 2 g into the vein daily. Last dose on 08/01/16 07/16/16   TatShanon Brow, MD  cholecalciferol (VITAMIN D) 1000 UNITS tablet Take 1,000 Units by mouth daily.     [provider]  diphenhydrAMINE (BENADRYL) 25 MG tablet Take 50 mg by mouth every 6 (six) hours as needed for itching or allergies.     [provider]  iron polysaccharides (NU-IRON) 150 MG capsule Take 1 capsule (150 mg total) by mouth daily. 07/21/16   Eugenie Filler, MD  isosorbide mononitrate (IMDUR) 30 MG 24 hr tablet TAKE 1 TABLET BY MOUTH AT BEDTIME Patient taking differently: TAKE 30 MG BY MOUTH AT BEDTIME 04/12/16   Brunetta Jeans, PA-C  metoprolol tartrate (LOPRESSOR) 25 MG tablet Take 0.5 tablets (12.5 mg total) by mouth 2 (two) times daily. 07/21/16   Eugenie Filler, MD  Multiple  Vitamins-Minerals (MULTIVITAMIN PO) Take 1 tablet by mouth daily.    [provider]  Omega-3 Fatty Acids (FISH OIL) 1000 MG CAPS Take 2,000 mg by mouth 2 (two) times daily.     [provider]  omeprazole (PRILOSEC) 20 MG capsule TAKE 1 CAPSULE(20 MG) BY MOUTH DAILY 07/03/16   Ladene Artist, MD  oxyCODONE (OXY IR/ROXICODONE) 5 MG immediate release tablet Take 1-2 tablets (5-10 mg total) by mouth every 4 (four) hours as needed for moderate pain. 07/21/16   Eugenie Filler, MD  senna-docusate (SENOKOT-S) 8.6-50 MG tablet Take 1 tablet by mouth 2 (two) times daily. 07/21/16   Eugenie Filler, MD    Vital Signs: There were no vitals taken for this visit.  Physical Exam: Abd: soft, drain with essentially no output.  Drain site is c/d/i.  Drain was removed with no complications.  He did have some ascites noted on his scan.   Once his drain was removed, he had copious ascitic fluid draining.  Finally a colostomy bag was obtained and put on his side to catch the drainage as it was still draining after 1.5 hours of care by myself.  Imaging: Ct Abdomen Pelvis W Contrast  Result Date: 07/25/2016 CLINICAL DATA:  Right-sided abdominal pain.  Hepatic abscess. EXAM: CT ABDOMEN AND PELVIS WITH CONTRAST TECHNIQUE: Multidetector CT imaging of the  abdomen and pelvis was performed using the standard protocol following bolus administration of intravenous contrast. CONTRAST:  164m ISOVUE-300 IOPAMIDOL (ISOVUE-300) INJECTION 61% COMPARISON:  CT scan of July 19, 2016. FINDINGS: Lower chest: Mild right pleural effusion is noted with adjacent subsegmental atelectasis. Hepatobiliary: Status post cholecystectomy. Drainage catheter is seen positioned within the multiloculated right hepatic abscess. However, the tip is uncoiled which is interval finding since prior exam. Continued presence of multiloculated abscess is noted which may be slightly improved compared to prior exam. It currently measures 6.3  x 6.2 x 6.1 cm which is slightly decreased compared to prior exam. Pancreas: Unremarkable. No pancreatic ductal dilatation or surrounding inflammatory changes. Spleen: Normal in size without focal abnormality. Adrenals/Urinary Tract: Adrenal glands are unremarkable. Kidneys are normal, without renal calculi, focal lesion, or hydronephrosis. Bladder is unremarkable. Stomach/Bowel: Stomach is within normal limits. Appendix appears normal. No evidence of bowel wall thickening, distention, or inflammatory changes. Sigmoid diverticulosis is noted. Vascular/Lymphatic: Atherosclerosis of abdominal aorta is noted. Stable 4.1 cm infrarenal abdominal aortic aneurysm. No significant adenopathy is noted. Reproductive: Prostate is unremarkable. Other: Mild amount of fluid is noted in the pelvis, as well as around the liver and spleen. Musculoskeletal: No acute or significant osseous findings. IMPRESSION: Drainage catheter tip is seen within the multiloculated right hepatic abscess, although its tip is now uncoiled which is new since prior exam. The abscess is slightly smaller compared to prior exam, currently measuring 6.3 x 6.2 x 6.1 cm. Aortic atherosclerosis. Mild right pleural effusion with adjacent subsegmental atelectasis. Mild amount of fluid is noted in the pelvis, as well as around the liver and spleen. Stable 4.1 cm infrarenal abdominal aortic aneurysm. Recommend followup by ultrasound in 1 year. This recommendation follows ACR consensus guidelines: White Paper of the ACR Incidental Findings Committee II on Vascular Findings. J Am Coll Radiol 2013; 10:789-794. Electronically Signed   By: JMarijo Conception M.D.   On: 07/25/2016 13:51    Labs:  CBC:  Recent Labs  07/18/16 1246 07/19/16 1818 07/20/16 0310 07/21/16 0330  WBC 6.7 6.4 6.0 7.4  HGB 9.4* 9.1* 8.1* 8.1*  HCT 29.4* 28.9* 25.9* 25.7*  PLT 233.0 252 227 233    COAGS:  Recent Labs  07/06/16 2122 07/11/16 0517 07/19/16 1818  INR 1.25 1.41  1.29    BMP:  Recent Labs  07/15/16 0615 07/18/16 1246 07/19/16 1818 07/20/16 0310 07/21/16 0330  NA 140 139 136 134* 135  K 3.6 4.0 3.5 3.5 4.1  CL 105 104 102 101 101  CO2 '26 31 26 26 28  ' GLUCOSE 101* 115* 107* 100* 101*  BUN 16 9 5* <5* 5*  CALCIUM 7.7* 7.6* 7.5* 7.5* 7.6*  CREATININE 0.65 0.51 0.54* 0.61 0.73  GFRNONAA >60  --  >60 >60 >60  GFRAA >60  --  >60 >60 >60    LIVER FUNCTION TESTS:  Recent Labs  07/15/16 0615 07/18/16 1246 07/19/16 1818 07/21/16 0330  BILITOT 1.2 0.8 0.9 0.9  AST '18 19 24 28  ' ALT 20 11 15* 14*  ALKPHOS 427* 425* 359* 308*  PROT 4.8* 5.0* 5.6* 5.2*  ALBUMIN 1.7* 2.1* 1.8* 1.6*    Assessment and Plan: 1. Liver abscess, s/p perc drain  The CT scan today still shows some residual component of abscess, but this is solid and not liquefied.  Dr. WEarleen Newportdiscussed with ID regarding removing his drain secondary to pain as well as minimal output and an improved CT scan.  The conclusion was to  remove the drain which we did.  He will keep his follow up with the ID clinic, but he no longer requires follow up in the drain clinic.  I have called and cancelled that appointment for him.  Electronically Signed: Henreitta Cea 07/25/2016, 2:47 PM   I spent a total of 25 Minutes at the the patient's bedside AND on the patient's hospital floor or unit, greater than 50% of which was counseling/coordinating care for liver abscess

## 2016-07-25 NOTE — Consult Note (Signed)
[place] Nurse wound consult note Reason for Consult: Wound type: Pressure Injury POA: Yes/No Measurement: Wound bed: Drainage (amount, consistency, odor)  Periwound: Dressing procedure/placement/frequency: Conservative sharp wound debridement (CSWD performed at the bedside):    Consult made for visit, however, spoke with Nurse Luanna Cole and all she needed was a pouch.  Gave her Kellie Simmering number for urine ostomy pouch.    Fara Olden, RN-C, WTA-[facility]

## 2016-07-27 ENCOUNTER — Ambulatory Visit (INDEPENDENT_AMBULATORY_CARE_PROVIDER_SITE_OTHER): Payer: Medicare Other | Admitting: Physician Assistant

## 2016-07-27 ENCOUNTER — Encounter: Payer: Self-pay | Admitting: Physician Assistant

## 2016-07-27 VITALS — BP 130/70 | HR 104 | Temp 97.9°F | Resp 16 | Ht 70.0 in | Wt 169.0 lb

## 2016-07-27 DIAGNOSIS — K750 Abscess of liver: Secondary | ICD-10-CM

## 2016-07-27 DIAGNOSIS — D649 Anemia, unspecified: Secondary | ICD-10-CM | POA: Diagnosis not present

## 2016-07-27 DIAGNOSIS — Z8619 Personal history of other infectious and parasitic diseases: Secondary | ICD-10-CM | POA: Diagnosis not present

## 2016-07-27 DIAGNOSIS — A409 Streptococcal sepsis, unspecified: Secondary | ICD-10-CM

## 2016-07-27 DIAGNOSIS — Z8709 Personal history of other diseases of the respiratory system: Secondary | ICD-10-CM

## 2016-07-27 DIAGNOSIS — J90 Pleural effusion, not elsewhere classified: Secondary | ICD-10-CM

## 2016-07-27 DIAGNOSIS — Z09 Encounter for follow-up examination after completed treatment for conditions other than malignant neoplasm: Secondary | ICD-10-CM

## 2016-07-27 DIAGNOSIS — D508 Other iron deficiency anemias: Secondary | ICD-10-CM

## 2016-07-27 LAB — COMPREHENSIVE METABOLIC PANEL
ALT: 15 U/L (ref 0–53)
AST: 26 U/L (ref 0–37)
Albumin: 2.7 g/dL — ABNORMAL LOW (ref 3.5–5.2)
Alkaline Phosphatase: 259 U/L — ABNORMAL HIGH (ref 39–117)
BUN: 9 mg/dL (ref 6–23)
CHLORIDE: 101 meq/L (ref 96–112)
CO2: 29 meq/L (ref 19–32)
CREATININE: 0.6 mg/dL (ref 0.40–1.50)
Calcium: 8.5 mg/dL (ref 8.4–10.5)
GFR: 140.05 mL/min (ref >60.00)
GLUCOSE: 113 mg/dL — AB (ref 70–99)
Potassium: 4.2 mEq/L (ref 3.5–5.1)
SODIUM: 138 meq/L (ref 135–145)
Total Bilirubin: 0.5 mg/dL (ref 0.2–1.2)
Total Protein: 6.2 g/dL (ref 6.0–8.3)

## 2016-07-27 LAB — CBC
HEMATOCRIT: 32.6 % — AB (ref 39.0–52.0)
Hemoglobin: 10.5 g/dL — ABNORMAL LOW (ref 13.0–17.0)
MCHC: 32 g/dL (ref 30.0–36.0)
MCV: 85.2 fl (ref 78.0–100.0)
Platelets: 217 10*3/uL (ref 150.0–400.0)
RBC: 3.83 Mil/uL — ABNORMAL LOW (ref 4.22–5.81)
RDW: 17.2 % — AB (ref 11.5–15.5)
WBC: 5.1 10*3/uL (ref 4.0–10.5)

## 2016-07-27 MED ORDER — AMLODIPINE BESYLATE 5 MG PO TABS
ORAL_TABLET | ORAL | 0 refills | Status: DC
Start: 2016-07-27 — End: 2016-08-20

## 2016-07-27 MED ORDER — OXYCODONE HCL 5 MG PO TABS: 5.0000 mg | ORAL_TABLET | Freq: Four times a day (QID) | ORAL | 0 refills | Status: DC | PRN

## 2016-07-27 NOTE — Progress Notes (Signed)
Pre visit review using our clinic review tool, if applicable. No additional management support is needed unless otherwise documented below in the visit note. 

## 2016-07-27 NOTE — Patient Instructions (Signed)
Please go to the lab for blood work.  We will call you with your results.  Keep up with diet and hydration. Let us know if you develop any recurrence of symptoms. Leg swelling should continue to resolve daily. Wear your compression stockings. If there is any worsening of swelling, you need to be seen immediately.  Follow-up with your specialists as directed.  Follow-up with me in 2 weeks.

## 2016-07-27 NOTE — Progress Notes (Signed)
Patient presents to clinic today for TCM visit s/p recent hospitalization. Patient was re-admitted to the hospital on 07/19/16 after having a CT scan revealing new onset R-sided pleural effusion in addition to hepatic abscess at which time a thoracentesis was performed, draining 650 cc of red fluid. Was subsequently admitted for Infectious Disease consultation and further management. Effusion fluid was analyzed -- negative for bacteria but abundant WBCs noted. No recurrence of effusion noted during stay. Patient continued on IV Rocephin during hospitalization. New 12 F drain placement on 07/20/16 by Dr. Earleen Newport. Was instructed to continue 2 more weeks of Rocephin per ID at which time they plan to switch to oral antibiotics. Patient noted to be stable and discharged home.  Since discharge patient endorses doing very well overall. Noted no drainage from the new drain that was placed by IR. Spoke with them and was seen on 07/25/16. It was decided to remove drain at that time as there was no fluid left to drain. Colostomy bag placed over the wound to collect any remnant drainage after removal of drain. Denies drainage in 2 days. Was told he could have removed either at home or in office once drainage has stopped. Denies any redness, tenderness of site. Would like removed. Patient denies any fever, chills since discharge. Denies abdominal tenderness, nausea or vomiting. Has been hydrating well. Notes increased appetite. Denies any noted swelling of abdomen. Notes only some residual lower extremity swelling around ankles and feet. Is improving every day. Has follow-up scheduled with Infectious Disease and Cardiology. Is taking iron supplement as directed for anemia. Is tolerating well without constipation. Patient states he feels better than he has in a very long time.   Past Medical History:  Diagnosis Date  . Aortic stenosis    mild echocardiogram 8/09 EF 60%, showed no regional wall motion abnml, mild LVH, mod  focal basal septal hypertrophy and mild dyastolic dysfunction. 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 mean gradient across aortic valve was 8 mmHG. also mild L atrial enlargement and normal RV size and function. minimal AS on LHC in 7/10.   . Arthritis    "all over" (07/19/2016)  . BPH (benign prostatic hypertrophy)   . CAD (coronary artery disease)    LCH (7/10) totally occluded proximal RCA with very robuse L to R collaterals, 50% proximal LAD stenosis, EF 65%, medical management.    . Chicken pox   . Chronic lower back pain    s/p surgical fusion  . Diverticulosis   . Esophageal stricture   . GERD (gastroesophageal reflux disease)   . Heart murmur   . Hepatitis B 1984  . Hiatal hernia   . HLD (hyperlipidemia)   . HTN (hypertension)   . 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 Osteoarthritis   . TIA (transient ischemic attack) 1990s   hx  . Tubular adenoma of colon 2009    Current Outpatient Prescriptions on File Prior to Visit  Medication Sig Dispense Refill  . aspirin 81 MG EC tablet Take 81 mg by mouth at bedtime.     . cefTRIAXone 2 g in dextrose 5 % 50 mL Inject 2 g into the vein daily. Last dose on 08/01/16 16 vial 0  . cholecalciferol (VITAMIN D) 1000 UNITS tablet Take 1,000 Units by mouth daily.     . diphenhydrAMINE (BENADRYL) 25 MG tablet Take 50 mg by mouth every 6 (six) hours as needed for itching or allergies.     Marland Kitchen  iron polysaccharides (NU-IRON) 150 MG capsule Take 1 capsule (150 mg total) by mouth daily.    . isosorbide mononitrate (IMDUR) 30 MG 24 hr tablet TAKE 1 TABLET BY MOUTH AT BEDTIME (Patient taking differently: TAKE 30 MG BY MOUTH AT BEDTIME) 90 tablet 1  . metoprolol tartrate (LOPRESSOR) 25 MG tablet Take 0.5 tablets (12.5 mg total) by mouth 2 (two) times daily. 60 tablet 0  . Multiple Vitamins-Minerals (MULTIVITAMIN PO) Take 1 tablet by mouth daily.    . Omega-3 Fatty Acids (FISH OIL) 1000 MG CAPS Take 2,000 mg by mouth 2  (two) times daily.     Marland Kitchen omeprazole (PRILOSEC) 20 MG capsule TAKE 1 CAPSULE(20 MG) BY MOUTH DAILY 30 capsule 11  . senna-docusate (SENOKOT-S) 8.6-50 MG tablet Take 1 tablet by mouth 2 (two) times daily.     No current facility-administered medications on file prior to visit.     Allergies  Allergen Reactions  . Celecoxib Hives and Itching    Family History  Problem Relation Age of Onset  . Heart disease Father 85       Living  . Coronary artery disease Father        CABG  . Alzheimer's disease Mother 45       Deceased  . Arthritis/Rheumatoid Mother   . Stomach cancer Maternal Uncle   . Brain cancer Maternal Aunt        x2  . Arthritis Brother        DJD  . Obesity Daughter        Had Bypass Sx    Social History   Social History  . Marital status: Married    Spouse name: etta  . Number of children: 2  . Years of education: N/A   Occupational History  . retired Retired    disabled due to back problems   Social History Main Topics  . Smoking status: Former Smoker    Packs/day: 1.50    Years: 50.00    Types: Cigarettes    Quit date: 06/2015  . Smokeless tobacco: Never Used     Comment: smoked less than 1 ppd for 40+ years; Using Vapor Cig  . Alcohol use 0.0 oz/week     Comment: 07/19/2016 "might have a few drinks/year"  . Drug use: No  . Sexual activity: No   Other Topics Concern  . None   Social History Narrative   Married (3rd), [place]. 2 children from 1st marriage, 4 step children.    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       Cell # [id number]   Review of Systems - See HPI.  All other ROS are negative.  BP 130/70   Pulse (!) 104   Temp 97.9 F (36.6 C) (Oral)   Resp 16   Ht 5' 10" (1.778 m)   Wt 169 lb (76.7 kg)   SpO2 97%   BMI 24.25 kg/m   Physical Exam  Constitutional: He is oriented to person, place, and time and well-developed, well-nourished, and in  no distress.  HENT:  Head: Normocephalic and atraumatic.  Eyes: Conjunctivae are normal.  Neck: Neck supple.  Cardiovascular: Normal rate, regular rhythm, normal heart sounds and intact distal pulses.   Pulmonary/Chest: Effort normal and breath sounds normal. No respiratory distress. He has no wheezes. He has no rales. He exhibits no tenderness.  Abdominal: Soft. Bowel sounds are normal.  He exhibits no distension. There is no tenderness. There is no rebound and no guarding.  1 cm healing incision at site of 1st drain. No evidence of infection.  1 cm healing incision at site of 2nd drain placement. This area covered by an ostomy bag. No drainage in two days per patient. No drainage appreciated on examination. No evidence of infection on visual examination.  Neurological: He is alert and oriented to person, place, and time.  Skin: Skin is warm and dry. No rash noted.  Psychiatric: Affect normal.  Vitals reviewed.  Recent Results (from the past 2160 hour(s))  Comprehensive metabolic panel     Status: Abnormal   Collection Time: 06/16/16  9:20 AM  Result Value Ref Range   Sodium 136 135 - 145 mmol/L   Potassium 3.3 (L) 3.5 - 5.1 mmol/L   Chloride 104 101 - 111 mmol/L   CO2 25 22 - 32 mmol/L   Glucose, Bld 171 (H) 65 - 99 mg/dL   BUN 9 6 - 20 mg/dL   Creatinine, Ser 0.82 0.61 - 1.24 mg/dL   Calcium 8.6 (L) 8.9 - 10.3 mg/dL   Total Protein 6.7 6.5 - 8.1 g/dL   Albumin 3.4 (L) 3.5 - 5.0 g/dL   AST 21 15 - 41 U/L   ALT 20 17 - 63 U/L   Alkaline Phosphatase 114 38 - 126 U/L   Total Bilirubin 0.6 0.3 - 1.2 mg/dL   GFR calc non Af Amer >60 >60 mL/min   GFR calc Af Amer >60 >60 mL/min    Comment: (NOTE) The eGFR has been calculated using the CKD EPI equation. This calculation has not been validated in all clinical situations. eGFR's persistently <60 mL/min signify possible Chronic Kidney Disease.    Anion gap 7 5 - 15  Lipase, blood     Status: None   Collection Time: 06/16/16  9:20  AM  Result Value Ref Range   Lipase 14 11 - 51 U/L  Troponin I     Status: None   Collection Time: 06/16/16  9:20 AM  Result Value Ref Range   Troponin I <0.03 <0.03 ng/mL  CBC with Differential     Status: Abnormal   Collection Time: 06/16/16  9:20 AM  Result Value Ref Range   WBC 9.8 4.0 - 10.5 K/uL   RBC 4.77 4.22 - 5.81 MIL/uL   Hemoglobin 13.6 13.0 - 17.0 g/dL   HCT 40.4 39.0 - 52.0 %   MCV 84.7 78.0 - 100.0 fL   MCH 28.5 26.0 - 34.0 pg   MCHC 33.7 30.0 - 36.0 g/dL   RDW 13.5 11.5 - 15.5 %   Platelets 143 (L) 150 - 400 K/uL   Neutrophils Relative % 80 %   Neutro Abs 7.9 (H) 1.7 - 7.7 K/uL   Lymphocytes Relative 11 %   Lymphs Abs 1.1 0.7 - 4.0 K/uL   Monocytes Relative 8 %   Monocytes Absolute 0.8 0.1 - 1.0 K/uL   Eosinophils Relative 1 %   Eosinophils Absolute 0.1 0.0 - 0.7 K/uL   Basophils Relative 0 %   Basophils Absolute 0.0 0.0 - 0.1 K/uL  Urinalysis, Routine w reflex microscopic     Status: Abnormal   Collection Time: 06/16/16  9:20 AM  Result Value Ref Range   Color, Urine YELLOW YELLOW   APPearance CLEAR CLEAR   Specific Gravity, Urine 1.020 1.005 - 1.030   pH 5.0 5.0 - 8.0   Glucose, UA 150 (A)  NEGATIVE mg/dL   Hgb urine dipstick SMALL (A) NEGATIVE   Bilirubin Urine NEGATIVE NEGATIVE   Ketones, ur NEGATIVE NEGATIVE mg/dL   Protein, ur 30 (A) NEGATIVE mg/dL   Nitrite NEGATIVE NEGATIVE   Leukocytes, UA NEGATIVE NEGATIVE   RBC / HPF 0-5 0 - 5 RBC/hpf   WBC, UA 0-5 0 - 5 WBC/hpf   Bacteria, UA NONE SEEN NONE SEEN   Squamous Epithelial / LPF NONE SEEN NONE SEEN   Mucous PRESENT   Rocky mtn spotted fvr abs pnl(IgG+IgM)     Status: None   Collection Time: 06/16/16  9:20 AM  Result Value Ref Range   RMSF IgG Negative Negative   RMSF IgM 0.42 0.00 - 0.89 index    Comment: (NOTE)                                 Negative        <0.90                                 Equivocal 0.90 - 1.10                                 Positive        >1.10 Performed At: [facility] [place], [place] [id number] Lindon Romp MD LG:[id number]   B. Claris Che antibodies     Status: None   Collection Time: 06/16/16  9:20 AM  Result Value Ref Range   B burgdorferi Ab IgG+IgM <0.91 0.00 - 0.90 ISR    Comment: (NOTE)                                Negative         <0.91                                Equivocal  0.91 - 1.09                                Positive         >1.09 Performed At: [facility] [place], [place] [id number] Lindon Romp MD DJ:[id number]   I-Stat CG4 Lactic Acid, ED     Status: None   Collection Time: 06/16/16  9:40 AM  Result Value Ref Range   Lactic Acid, Venous 1.76 0.5 - 1.9 mmol/L  CBC with Differential/Platelet     Status: Abnormal   Collection Time: 07/04/16 10:52 AM  Result Value Ref Range   WBC 8.5 4.0 - 10.5 K/uL   RBC 4.65 4.22 - 5.81 Mil/uL   Hemoglobin 13.0 13.0 - 17.0 g/dL   HCT 39.1 39.0 - 52.0 %   MCV 84.0 78.0 - 100.0 fl   MCHC 33.1 30.0 - 36.0 g/dL   RDW 14.1 11.5 - 15.5 %   Platelets 216.0 150.0 - 400.0 K/uL   Neutrophils Relative % 79.4 (H) 43.0 - 77.0 %   Lymphocytes Relative 13.1 12.0 - 46.0 %   Monocytes Relative 6.6 3.0 - 12.0 %   Eosinophils Relative 0.6 0.0 - 5.0 %  Basophils Relative 0.3 0.0 - 3.0 %   Neutro Abs 6.8 1.4 - 7.7 K/uL   Lymphs Abs 1.1 0.7 - 4.0 K/uL   Monocytes Absolute 0.6 0.1 - 1.0 K/uL   Eosinophils Absolute 0.1 0.0 - 0.7 K/uL   Basophils Absolute 0.0 0.0 - 0.1 K/uL  Comprehensive metabolic panel     Status: Abnormal   Collection Time: 07/04/16 10:52 AM  Result Value Ref Range   Sodium 138 135 - 145 mEq/L   Potassium 4.0 3.5 - 5.1 mEq/L   Chloride 102 96 - 112 mEq/L   CO2 30 19 - 32 mEq/L   Glucose, Bld 122 (H) 70 - 99 mg/dL   BUN 8 6 - 23 mg/dL   Creatinine, Ser 0.76 0.40 - 1.50 mg/dL   Total Bilirubin 0.7 0.2 - 1.2 mg/dL   Alkaline Phosphatase 173 (H) 39 - 117 U/L   AST 17 0 - 37 U/L   ALT 18 0 - 53 U/L   Total Protein  6.4 6.0 - 8.3 g/dL   Albumin 3.6 3.5 - 5.2 g/dL   Calcium 9.0 8.4 - 10.5 mg/dL   GFR 106.63 >60.00 mL/min  TSH     Status: None   Collection Time: 07/04/16 10:52 AM  Result Value Ref Range   TSH 0.95 0.35 - 4.50 uIU/mL  CMV abs, IgG+IgM (cytomegalovirus)     Status: Abnormal   Collection Time: 07/04/16 10:52 AM  Result Value Ref Range   Cytomegalovirus Ab-IgG 6.10 (H) <0.60 U/mL    Comment:        Value         Interpretation      =====         ==============      < 0.60         Negative      0.61-0.69      Equivocal      >= 0.70        Positive   A positive result indicates that the patient has antibody to CMV. It does not differentiate between an active or past infection.      CMV IgM <30.00 <30.00 AU/mL    Comment:        Value           Interpretation      =====           ==============      < 30.00         Negative      30.00-34.99     Equivocal      >= 35.00        Positive   Results from any one IgM assay should not be used as a sole determinant of a current or recent infection. Because an IgM test can yield false positive results and low level IgM antibody may persist for more than 12 months post infection, reliance on a single test result could be misleading. Acute infection is best diagnosed by demonstrating the conversion of IgG from negative to positive. If an acute infection is suspected, consider obtaining a new specimen and submit for both IgG and IgM testing in two or more weeks.     Epstein-Barr Virus VCA Antibody Panel     Status: Abnormal   Collection Time: 07/04/16 10:52 AM  Result Value Ref Range   EBV VCA IgG >750.00 (H) U/mL    Comment:  U/mL         Interpretation                          -------------     --------------                                < 18.00       Negative                          18.00 - 21.99       Equivocal                                > 21.99       Positive      EBV VCA IgM <36.00 U/mL      Comment:                                  U/mL         Interpretation                           -------------     --------------                                 < 36.00       Negative                           36.00 - 43.99       Equivocal                                 > 43.99       Positive      EBV NA IgG 261.00 (H) U/mL    Comment:                                 U/mL         Interpretation                          -------------     --------------                                < 18.00       Negative                          18.00 - 21.99       Equivocal                                > 21.99       Positive      Interpretation SEE NOTE     Comment:   Suggestive of a past Epstein-Barr Virus infection.  In infants, a similar pattern may occur as a  result of passive maternal transfer of antibody.     Sed Rate (ESR)     Status: Abnormal   Collection Time: 07/04/16 10:52 AM  Result Value Ref Range   Sed Rate 33 (H) 0 - 20 mm/hr  POCT Urinalysis Dipstick     Status: Abnormal   Collection Time: 07/04/16 10:53 AM  Result Value Ref Range   Color, UA yellow    Clarity, UA clear    Glucose, UA negative    Bilirubin, UA negative    Ketones, UA negative    Spec Grav, UA 1.010 1.010 - 1.025   Blood, UA negative    pH, UA 7.5 5.0 - 8.0   Protein, UA trace    Urobilinogen, UA 1.0 0.2 or 1.0 E.U./dL   Nitrite, UA negative    Leukocytes, UA Negative Negative  Comprehensive metabolic panel     Status: Abnormal   Collection Time: 07/06/16  9:22 PM  Result Value Ref Range   Sodium 137 135 - 145 mmol/L   Potassium 3.3 (L) 3.5 - 5.1 mmol/L   Chloride 102 101 - 111 mmol/L   CO2 25 22 - 32 mmol/L   Glucose, Bld 116 (H) 65 - 99 mg/dL   BUN 12 6 - 20 mg/dL   Creatinine, Ser 0.89 0.61 - 1.24 mg/dL   Calcium 8.5 (L) 8.9 - 10.3 mg/dL   Total Protein 7.1 6.5 - 8.1 g/dL   Albumin 3.1 (L) 3.5 - 5.0 g/dL   AST 28 15 - 41 U/L   ALT 28 17 - 63 U/L   Alkaline Phosphatase 196 (H) 38 -  126 U/L   Total Bilirubin 1.2 0.3 - 1.2 mg/dL   GFR calc non Af Amer >60 >60 mL/min   GFR calc Af Amer >60 >60 mL/min    Comment: (NOTE) The eGFR has been calculated using the CKD EPI equation. This calculation has not been validated in all clinical situations. eGFR's persistently <60 mL/min signify possible Chronic Kidney Disease.    Anion gap 10 5 - 15  CBC with Differential     Status: Abnormal   Collection Time: 07/06/16  9:22 PM  Result Value Ref Range   WBC 13.0 (H) 4.0 - 10.5 K/uL   RBC 4.46 4.22 - 5.81 MIL/uL   Hemoglobin 12.3 (L) 13.0 - 17.0 g/dL   HCT 37.5 (L) 39.0 - 52.0 %   MCV 84.1 78.0 - 100.0 fL   MCH 27.6 26.0 - 34.0 pg   MCHC 32.8 30.0 - 36.0 g/dL   RDW 13.5 11.5 - 15.5 %   Platelets 165 150 - 400 K/uL   Neutrophils Relative % 84 %   Neutro Abs 11.0 (H) 1.7 - 7.7 K/uL   Lymphocytes Relative 7 %   Lymphs Abs 1.0 0.7 - 4.0 K/uL   Monocytes Relative 9 %   Monocytes Absolute 1.1 (H) 0.1 - 1.0 K/uL   Eosinophils Relative 0 %   Eosinophils Absolute 0.0 0.0 - 0.7 K/uL   Basophils Relative 0 %   Basophils Absolute 0.0 0.0 - 0.1 K/uL  Protime-INR     Status: Abnormal   Collection Time: 07/06/16  9:22 PM  Result Value Ref Range   Prothrombin Time 15.7 (H) 11.4 - 15.2 seconds   INR 1.25   Culture, blood (Routine x 2)     Status: None   Collection Time: 07/06/16  9:22 PM  Result Value Ref Range   Specimen Description BLOOD LEFT FOREARM    Special  Requests BOTTLES DRAWN AEROBIC AND ANAEROBIC BCAV    Culture      NO GROWTH 5 DAYS Performed at [facility], [place] [address], [place], [place] [address]    Report Status 07/12/2016 FINAL   Culture, blood (Routine x 2)     Status: None   Collection Time: 07/06/16  9:27 PM  Result Value Ref Range   Specimen Description BLOOD RAC    Special Requests BOTTLES DRAWN AEROBIC AND ANAEROBIC BCAV    Culture      NO GROWTH 5 DAYS Performed at [facility], [place] [address], [place], [place] [address]    Report  Status 07/12/2016 FINAL   I-Stat CG4 Lactic Acid, ED     Status: None   Collection Time: 07/06/16  9:44 PM  Result Value Ref Range   Lactic Acid, Venous 1.23 0.5 - 1.9 mmol/L  Urinalysis, Routine w reflex microscopic     Status: Abnormal   Collection Time: 07/06/16  9:55 PM  Result Value Ref Range   Color, Urine AMBER (A) YELLOW    Comment: BIOCHEMICALS MAY BE AFFECTED BY COLOR   APPearance CLEAR CLEAR   Specific Gravity, Urine 1.019 1.005 - 1.030   pH 5.0 5.0 - 8.0   Glucose, UA NEGATIVE NEGATIVE mg/dL   Hgb urine dipstick SMALL (A) NEGATIVE   Bilirubin Urine NEGATIVE NEGATIVE   Ketones, ur NEGATIVE NEGATIVE mg/dL   Protein, ur 30 (A) NEGATIVE mg/dL   Nitrite NEGATIVE NEGATIVE   Leukocytes, UA NEGATIVE NEGATIVE   RBC / HPF 0-5 0 - 5 RBC/hpf   WBC, UA 0-5 0 - 5 WBC/hpf   Bacteria, UA NONE SEEN NONE SEEN   Squamous Epithelial / LPF NONE SEEN NONE SEEN   Mucous PRESENT   Acute Hep Panel & Hep B Surface Ab     Status: None   Collection Time: 07/09/16  3:20 PM  Result Value Ref Range   Hepatitis B Surface Ag NEGATIVE NEGATIVE   Hep B C IgM NON REACTIVE NON REACTIVE    Comment: High levels of Hepatitis B Core IgM antibody are detectable during the acute stage of Hepatitis B. This antibody is used to differentiate current from past HBV infection.      Hep B S Ab NEG NEGATIVE   Hep A IgM NON REACTIVE NON REACTIVE   HCV Ab NEGATIVE NEGATIVE  Rocky mtn spotted fvr abs pnl(IgG+IgM)     Status: None   Collection Time: 07/09/16  3:20 PM  Result Value Ref Range   RMSF IgG NOT DETECTED NOT DETECTED   RMSF IgM NOT DETECTED NOT DETECTED    Comment: Cross-reactivity between the Spotted Fever and Typhus groups is minor. Significant cross reactivity within the Spotted Fever or Typhus group precludes speciation of the rickettsiae.   CBC with Differential/Platelet     Status: Abnormal   Collection Time: 07/09/16  3:20 PM  Result Value Ref Range   WBC 13.4 (H) 4.0 - 10.5 K/uL   RBC 4.30  4.22 - 5.81 Mil/uL   Hemoglobin 11.7 (L) 13.0 - 17.0 g/dL   HCT 35.7 (L) 39.0 - 52.0 %   MCV 83.0 78.0 - 100.0 fl   MCHC 32.7 30.0 - 36.0 g/dL   RDW 15.0 11.5 - 15.5 %   Platelets 115.0 (L) 150.0 - 400.0 K/uL   Neutrophils Relative % 85.6 (H) 43.0 - 77.0 %    Comment: manual diff=76seg,6band,14lymph,52moo   Lymphocytes Relative 10.0 (L) 12.0 - 46.0 %  Monocytes Relative 4.0 3.0 - 12.0 %   Eosinophils Relative 0.3 0.0 - 5.0 %   Basophils Relative 0.1 0.0 - 3.0 %   Neutro Abs 11.5 (H) 1.4 - 7.7 K/uL   Lymphs Abs 1.3 0.7 - 4.0 K/uL   Monocytes Absolute 0.5 0.1 - 1.0 K/uL   Eosinophils Absolute 0.0 0.0 - 0.7 K/uL   Basophils Absolute 0.0 0.0 - 0.1 K/uL  Hepatic function panel     Status: Abnormal   Collection Time: 07/09/16  3:20 PM  Result Value Ref Range   Total Bilirubin 1.3 (H) 0.2 - 1.2 mg/dL   Bilirubin, Direct 0.8 (H) 0.0 - 0.3 mg/dL   Alkaline Phosphatase 247 (H) 39 - 117 U/L   AST 77 (H) 0 - 37 U/L   ALT 86 (H) 0 - 53 U/L   Total Protein 5.5 (L) 6.0 - 8.3 g/dL   Albumin 2.9 (L) 3.5 - 5.2 g/dL  Antinuclear Antib (ANA)     Status: None   Collection Time: 07/09/16  3:20 PM  Result Value Ref Range   Anit Nuclear Antibody(ANA) NEG NEGATIVE    Comment: Although the specimen was negative for antinuclear antibodies (ANA), the presence of cytoplasmic fluorescence was noted on the HEp-2 slide. Other reactivities (e.g., anti-mitochondrial antibodies or anti-smooth muscle antibodies) may be responsible for this fluorescence.   Lyme Ab/Western Blot Reflex     Status: None   Collection Time: 07/09/16  3:20 PM  Result Value Ref Range   B burgdorferi Ab IgG+IgM <0.90 <0.90 Index    Comment:                      Index                Interpretation                    -----                --------------                    < 0.90               NEGATIVE                    0.90-1.09            EQUIVOCAL                    > 1.09               POSITIVE   As recommended by the Food and  Drug Administration (FDA), all samples with positive or equivocal results on the B. burgdorferi antibody screen will be tested by Western Blot/Immunoblot. Positive or equivocal screening test results should not be interpreted as truly positive until verified as such using a supplemental assay (e.g., B. burgdorferi blot).   The screening test and/or blot for B. burgdorferi antibodies may be falsely negative in early stages of Lyme disease, including the period when erythema migrans is apparent.     Comprehensive metabolic panel     Status: Abnormal   Collection Time: 07/10/16  5:35 PM  Result Value Ref Range   Sodium 140 135 - 145 mmol/L   Potassium 3.7 3.5 - 5.1 mmol/L   Chloride 107 101 - 111 mmol/L   CO2 24 22 - 32 mmol/L   Glucose, Bld 120 (H) 65 - 99 mg/dL  BUN 35 (H) 6 - 20 mg/dL   Creatinine, Ser 0.95 0.61 - 1.24 mg/dL   Calcium 7.8 (L) 8.9 - 10.3 mg/dL   Total Protein 5.3 (L) 6.5 - 8.1 g/dL   Albumin 2.1 (L) 3.5 - 5.0 g/dL   AST 47 (H) 15 - 41 U/L   ALT 56 17 - 63 U/L   Alkaline Phosphatase 310 (H) 38 - 126 U/L   Total Bilirubin 1.9 (H) 0.3 - 1.2 mg/dL   GFR calc non Af Amer >60 >60 mL/min   GFR calc Af Amer >60 >60 mL/min    Comment: (NOTE) The eGFR has been calculated using the CKD EPI equation. This calculation has not been validated in all clinical situations. eGFR's persistently <60 mL/min signify possible Chronic Kidney Disease.    Anion gap 9 5 - 15  CBC WITH DIFFERENTIAL     Status: Abnormal   Collection Time: 07/10/16  5:35 PM  Result Value Ref Range   WBC 10.0 4.0 - 10.5 K/uL   RBC 3.77 (L) 4.22 - 5.81 MIL/uL   Hemoglobin 10.3 (L) 13.0 - 17.0 g/dL   HCT 30.9 (L) 39.0 - 52.0 %   MCV 82.0 78.0 - 100.0 fL   MCH 27.3 26.0 - 34.0 pg   MCHC 33.3 30.0 - 36.0 g/dL   RDW 14.7 11.5 - 15.5 %   Platelets 118 (L) 150 - 400 K/uL    Comment: SPECIMEN CHECKED FOR CLOTS REPEATED TO VERIFY PLATELET COUNT CONFIRMED BY SMEAR    Neutrophils Relative % 85 %    Lymphocytes Relative 8 %   Monocytes Relative 7 %   Eosinophils Relative 0 %   Basophils Relative 0 %   Neutro Abs 8.5 (H) 1.7 - 7.7 K/uL   Lymphs Abs 0.8 0.7 - 4.0 K/uL   Monocytes Absolute 0.7 0.1 - 1.0 K/uL   Eosinophils Absolute 0.0 0.0 - 0.7 K/uL   Basophils Absolute 0.0 0.0 - 0.1 K/uL   Smear Review MORPHOLOGY UNREMARKABLE   Blood Culture (routine x 2)     Status: Abnormal   Collection Time: 07/10/16  5:35 PM  Result Value Ref Range   Specimen Description BLOOD LEFT ARM    Special Requests      BOTTLES DRAWN AEROBIC AND ANAEROBIC Blood Culture adequate volume   Culture  Setup Time      GRAM POSITIVE COCCI IN CHAINS ANAEROBIC BOTTLE ONLY Organism ID to follow CRITICAL RESULT CALLED TO, READ BACK BY AND VERIFIED WITH: J. Scherrie November Pharm.D. 11:15 07/12/16 ([email or web address])    Culture (A)     STREPTOCOCCUS INTERMEDIUS SUSCEPTIBILITIES PERFORMED ON PREVIOUS CULTURE WITHIN THE LAST 5 DAYS. Performed at [facility], [place] [address] [place], [place] [address]    Report Status 07/15/2016 FINAL   Urinalysis, Routine w reflex microscopic     Status: Abnormal   Collection Time: 07/10/16  5:35 PM  Result Value Ref Range   Color, Urine YELLOW YELLOW   APPearance CLEAR CLEAR   Specific Gravity, Urine 1.038 (H) 1.005 - 1.030   pH 6.0 5.0 - 8.0   Glucose, UA NEGATIVE NEGATIVE mg/dL   Hgb urine dipstick NEGATIVE NEGATIVE   Bilirubin Urine NEGATIVE NEGATIVE   Ketones, ur NEGATIVE NEGATIVE mg/dL   Protein, ur NEGATIVE NEGATIVE mg/dL   Nitrite NEGATIVE NEGATIVE   Leukocytes, UA NEGATIVE NEGATIVE  Blood Culture ID Panel (Reflexed)     Status: Abnormal   Collection Time: 07/10/16  5:35 PM  Result Value Ref Range  Enterococcus species NOT DETECTED NOT DETECTED   Listeria monocytogenes NOT DETECTED NOT DETECTED   Staphylococcus species NOT DETECTED NOT DETECTED   Staphylococcus aureus NOT DETECTED NOT DETECTED   Streptococcus species DETECTED (A) NOT DETECTED    Comment: Not Enterococcus  species, Streptococcus agalactiae, Streptococcus pyogenes, or Streptococcus pneumoniae. CRITICAL RESULT CALLED TO, READ BACK BY AND VERIFIED WITH: J. Scherrie November Pharm.D. 11:15 07/12/16 ([email or web address])    Streptococcus agalactiae NOT DETECTED NOT DETECTED   Streptococcus pneumoniae NOT DETECTED NOT DETECTED   Streptococcus pyogenes NOT DETECTED NOT DETECTED   Acinetobacter baumannii NOT DETECTED NOT DETECTED   Enterobacteriaceae species NOT DETECTED NOT DETECTED   Enterobacter cloacae complex NOT DETECTED NOT DETECTED   Escherichia coli NOT DETECTED NOT DETECTED   Klebsiella oxytoca NOT DETECTED NOT DETECTED   Klebsiella pneumoniae NOT DETECTED NOT DETECTED   Proteus species NOT DETECTED NOT DETECTED   Serratia marcescens NOT DETECTED NOT DETECTED   Haemophilus influenzae NOT DETECTED NOT DETECTED   Neisseria meningitidis NOT DETECTED NOT DETECTED   Pseudomonas aeruginosa NOT DETECTED NOT DETECTED   Candida albicans NOT DETECTED NOT DETECTED   Candida glabrata NOT DETECTED NOT DETECTED   Candida krusei NOT DETECTED NOT DETECTED   Candida parapsilosis NOT DETECTED NOT DETECTED   Candida tropicalis NOT DETECTED NOT DETECTED    Comment: Performed at [facility], [place] [address], [place], [place] [address]  Blood Culture (routine x 2)     Status: Abnormal   Collection Time: 07/10/16  5:43 PM  Result Value Ref Range   Specimen Description BLOOD RIGHT HAND    Special Requests      BOTTLES DRAWN AEROBIC AND ANAEROBIC Blood Culture adequate volume   Culture  Setup Time      GRAM POSITIVE COCCI IN CHAINS ANAEROBIC BOTTLE ONLY CRITICAL VALUE NOTED.  VALUE IS CONSISTENT WITH PREVIOUSLY REPORTED AND CALLED VALUE. Performed at [facility], [place] [address] [place], [place] [address]    Culture STREPTOCOCCUS INTERMEDIUS (A)    Report Status 07/15/2016 FINAL    Organism ID, Bacteria STREPTOCOCCUS INTERMEDIUS       Susceptibility   Streptococcus intermedius - MIC*    PENICILLIN <=0.06  SENSITIVE Sensitive     CEFTRIAXONE 0.25 SENSITIVE Sensitive     ERYTHROMYCIN >=8 RESISTANT Resistant     LEVOFLOXACIN <=0.25 SENSITIVE Sensitive     VANCOMYCIN 0.5 SENSITIVE Sensitive     * STREPTOCOCCUS INTERMEDIUS  I-Stat CG4 Lactic Acid, ED  (not at  [facility])     Status: None   Collection Time: 07/10/16  5:46 PM  Result Value Ref Range   Lactic Acid, Venous 1.31 0.5 - 1.9 mmol/L  Vitamin B12     Status: Abnormal   Collection Time: 07/10/16 10:55 PM  Result Value Ref Range   Vitamin B-12 4,419 (H) 180 - 914 pg/mL    Comment: (NOTE) This assay is not validated for testing neonatal or myeloproliferative syndrome specimens for Vitamin B12 levels. Performed at [facility], [place] [address] [place], [place] [address]   Folate     Status: None   Collection Time: 07/10/16 10:55 PM  Result Value Ref Range   Folate 7.4 >5.9 ng/mL    Comment: Performed at [facility], [place] [address], [place], [place] [address]  Iron and TIBC     Status: Abnormal   Collection Time: 07/10/16 10:55 PM  Result Value Ref Range   Iron 6 (L) 45 - 182 ug/dL  TIBC 134 (L) 250 - 450 ug/dL   Saturation Ratios 4 (L) 17.9 - 39.5 %   UIBC 128 ug/dL    Comment: Performed at [facility], [place] [address], [place], [place] [address]  Ferritin     Status: Abnormal   Collection Time: 07/10/16 10:55 PM  Result Value Ref Range   Ferritin 499 (H) 24 - 336 ng/mL    Comment: Performed at [facility], [place] [address], [place], [place] [address]  Reticulocytes     Status: Abnormal   Collection Time: 07/10/16 10:55 PM  Result Value Ref Range   Retic Ct Pct 0.5 0.4 - 3.1 %   RBC. 3.82 (L) 4.22 - 5.81 MIL/uL   Retic Count, Absolute 19.1 19.0 - 186.0 K/uL  E. histolytica antibody (amoeba ab)     Status: None   Collection Time: 07/10/16 10:55 PM  Result Value Ref Range   E histolytica Ab Negative Negative    Comment: (NOTE) Performed At: [facility] [place], [place]  [id number] Lindon Romp MD [id number]   Strep pneumoniae urinary antigen     Status: None   Collection Time: 07/10/16 11:47 PM  Result Value Ref Range   Strep Pneumo Urinary Antigen NEGATIVE NEGATIVE    Comment:        Infection due to S. pneumoniae cannot be absolutely ruled out since the antigen present may be below the detection limit of the test. Performed at [facility], [place] [address] [place], [place] [address]   CBC with Differential/Platelet     Status: Abnormal   Collection Time: 07/11/16  5:17 AM  Result Value Ref Range   WBC 11.5 (H) 4.0 - 10.5 K/uL   RBC 3.69 (L) 4.22 - 5.81 MIL/uL   Hemoglobin 10.1 (L) 13.0 - 17.0 g/dL   HCT 30.6 (L) 39.0 - 52.0 %   MCV 82.9 78.0 - 100.0 fL   MCH 27.4 26.0 - 34.0 pg   MCHC 33.0 30.0 - 36.0 g/dL   RDW 14.9 11.5 - 15.5 %   Platelets 95 (L) 150 - 400 K/uL    Comment: CONSISTENT WITH PREVIOUS RESULT   Neutrophils Relative % 86 %   Neutro Abs 9.9 (H) 1.7 - 7.7 K/uL   Lymphocytes Relative 7 %   Lymphs Abs 0.8 0.7 - 4.0 K/uL   Monocytes Relative 7 %   Monocytes Absolute 0.7 0.1 - 1.0 K/uL   Eosinophils Relative 0 %   Eosinophils Absolute 0.0 0.0 - 0.7 K/uL   Basophils Relative 0 %   Basophils Absolute 0.0 0.0 - 0.1 K/uL  Protime-INR     Status: Abnormal   Collection Time: 07/11/16  5:17 AM  Result Value Ref Range   Prothrombin Time 17.3 (H) 11.4 - 15.2 seconds   INR 1.41   Comprehensive metabolic panel     Status: Abnormal   Collection Time: 07/11/16  5:17 AM  Result Value Ref Range   Sodium 141 135 - 145 mmol/L   Potassium 3.6 3.5 - 5.1 mmol/L   Chloride 111 101 - 111 mmol/L   CO2 24 22 - 32 mmol/L   Glucose, Bld 91 65 - 99 mg/dL   BUN 27 (H) 6 - 20 mg/dL   Creatinine, Ser 0.79 0.61 - 1.24 mg/dL   Calcium 7.4 (L) 8.9 - 10.3 mg/dL   Total Protein 5.0 (L) 6.5 - 8.1 g/dL   Albumin 1.8 (L) 3.5 - 5.0 g/dL  AST 49 (H) 15 - 41 U/L   ALT 52 17 - 63 U/L   Alkaline Phosphatase 286 (H) 38 - 126 U/L   Total  Bilirubin 2.1 (H) 0.3 - 1.2 mg/dL   GFR calc non Af Amer >60 >60 mL/min   GFR calc Af Amer >60 >60 mL/min    Comment: (NOTE) The eGFR has been calculated using the CKD EPI equation. This calculation has not been validated in all clinical situations. eGFR's persistently <60 mL/min signify possible Chronic Kidney Disease.    Anion gap 6 5 - 15  Aerobic/Anaerobic Culture (surgical/deep wound)     Status: None   Collection Time: 07/11/16  5:22 PM  Result Value Ref Range   Specimen Description ABSCESS HEPATIC    Special Requests NONE    Gram Stain      ABUNDANT WBC PRESENT, PREDOMINANTLY PMN ABUNDANT GRAM POSITIVE COCCI IN PAIRS    Culture      ABUNDANT STREPTOCOCCUS INTERMEDIUS NO ANAEROBES ISOLATED Performed at [facility], [place] [address] [place], [place] [address]    Report Status 07/16/2016 FINAL    Organism ID, Bacteria STREPTOCOCCUS INTERMEDIUS       Susceptibility   Streptococcus intermedius - MIC*    PENICILLIN <=0.06 SENSITIVE Sensitive     CEFTRIAXONE <=0.12 SENSITIVE Sensitive     ERYTHROMYCIN >=8 RESISTANT Resistant     LEVOFLOXACIN <=0.25 SENSITIVE Sensitive     VANCOMYCIN 0.25 SENSITIVE Sensitive     * ABUNDANT STREPTOCOCCUS INTERMEDIUS  MRSA PCR Screening     Status: None   Collection Time: 07/11/16  6:54 PM  Result Value Ref Range   MRSA by PCR NEGATIVE NEGATIVE    Comment:        The GeneXpert MRSA Assay (FDA approved for NASAL specimens only), is one component of a comprehensive MRSA colonization surveillance program. It is not intended to diagnose MRSA infection nor to guide or monitor treatment for MRSA infections.   Comprehensive metabolic panel     Status: Abnormal   Collection Time: 07/12/16  8:59 AM  Result Value Ref Range   Sodium 142 135 - 145 mmol/L   Potassium 3.3 (L) 3.5 - 5.1 mmol/L   Chloride 111 101 - 111 mmol/L   CO2 26 22 - 32 mmol/L   Glucose, Bld 110 (H) 65 - 99 mg/dL   BUN 29 (H) 6 - 20 mg/dL   Creatinine, Ser 0.89 0.61  - 1.24 mg/dL   Calcium 7.9 (L) 8.9 - 10.3 mg/dL   Total Protein 5.2 (L) 6.5 - 8.1 g/dL   Albumin 1.9 (L) 3.5 - 5.0 g/dL   AST 41 15 - 41 U/L   ALT 45 17 - 63 U/L   Alkaline Phosphatase 309 (H) 38 - 126 U/L   Total Bilirubin 1.8 (H) 0.3 - 1.2 mg/dL   GFR calc non Af Amer >60 >60 mL/min   GFR calc Af Amer >60 >60 mL/min    Comment: (NOTE) The eGFR has been calculated using the CKD EPI equation. This calculation has not been validated in all clinical situations. eGFR's persistently <60 mL/min signify possible Chronic Kidney Disease.    Anion gap 5 5 - 15  CBC     Status: Abnormal   Collection Time: 07/12/16  8:59 AM  Result Value Ref Range   WBC 11.8 (H) 4.0 - 10.5 K/uL   RBC 3.61 (L) 4.22 - 5.81 MIL/uL   Hemoglobin 9.9 (L) 13.0 - 17.0 g/dL   HCT 30.3 (  L) 39.0 - 52.0 %   MCV 83.9 78.0 - 100.0 fL   MCH 27.4 26.0 - 34.0 pg   MCHC 32.7 30.0 - 36.0 g/dL   RDW 15.2 11.5 - 15.5 %   Platelets 114 (L) 150 - 400 K/uL    Comment: CONSISTENT WITH PREVIOUS RESULT  Ammonia     Status: Abnormal   Collection Time: 07/12/16  2:57 PM  Result Value Ref Range   Ammonia 39 (H) 9 - 35 umol/L  Comprehensive metabolic panel     Status: Abnormal   Collection Time: 07/13/16  3:20 AM  Result Value Ref Range   Sodium 140 135 - 145 mmol/L   Potassium 3.5 3.5 - 5.1 mmol/L   Chloride 111 101 - 111 mmol/L   CO2 23 22 - 32 mmol/L   Glucose, Bld 113 (H) 65 - 99 mg/dL   BUN 21 (H) 6 - 20 mg/dL   Creatinine, Ser 0.75 0.61 - 1.24 mg/dL   Calcium 7.5 (L) 8.9 - 10.3 mg/dL   Total Protein 4.6 (L) 6.5 - 8.1 g/dL   Albumin 1.7 (L) 3.5 - 5.0 g/dL   AST 32 15 - 41 U/L   ALT 35 17 - 63 U/L   Alkaline Phosphatase 404 (H) 38 - 126 U/L   Total Bilirubin 1.3 (H) 0.3 - 1.2 mg/dL   GFR calc non Af Amer >60 >60 mL/min   GFR calc Af Amer >60 >60 mL/min    Comment: (NOTE) The eGFR has been calculated using the CKD EPI equation. This calculation has not been validated in all clinical situations. eGFR's persistently  <60 mL/min signify possible Chronic Kidney Disease.    Anion gap 6 5 - 15  CBC     Status: Abnormal   Collection Time: 07/13/16  3:20 AM  Result Value Ref Range   WBC 10.5 4.0 - 10.5 K/uL   RBC 3.47 (L) 4.22 - 5.81 MIL/uL   Hemoglobin 9.5 (L) 13.0 - 17.0 g/dL   HCT 28.7 (L) 39.0 - 52.0 %   MCV 82.7 78.0 - 100.0 fL   MCH 27.4 26.0 - 34.0 pg   MCHC 33.1 30.0 - 36.0 g/dL   RDW 15.3 11.5 - 15.5 %   Platelets 127 (L) 150 - 400 K/uL  Comprehensive metabolic panel     Status: Abnormal   Collection Time: 07/14/16  8:02 AM  Result Value Ref Range   Sodium 140 135 - 145 mmol/L   Potassium 3.5 3.5 - 5.1 mmol/L   Chloride 108 101 - 111 mmol/L   CO2 24 22 - 32 mmol/L   Glucose, Bld 96 65 - 99 mg/dL   BUN 16 6 - 20 mg/dL   Creatinine, Ser 0.58 (L) 0.61 - 1.24 mg/dL   Calcium 7.6 (L) 8.9 - 10.3 mg/dL   Total Protein 4.7 (L) 6.5 - 8.1 g/dL   Albumin 1.7 (L) 3.5 - 5.0 g/dL   AST 23 15 - 41 U/L   ALT 26 17 - 63 U/L   Alkaline Phosphatase 435 (H) 38 - 126 U/L   Total Bilirubin 1.5 (H) 0.3 - 1.2 mg/dL   GFR calc non Af Amer >60 >60 mL/min   GFR calc Af Amer >60 >60 mL/min    Comment: (NOTE) The eGFR has been calculated using the CKD EPI equation. This calculation has not been validated in all clinical situations. eGFR's persistently <60 mL/min signify possible Chronic Kidney Disease.    Anion gap 8 5 - 15  Comprehensive metabolic panel     Status: Abnormal   Collection Time: 07/15/16  6:15 AM  Result Value Ref Range   Sodium 140 135 - 145 mmol/L   Potassium 3.6 3.5 - 5.1 mmol/L   Chloride 105 101 - 111 mmol/L   CO2 26 22 - 32 mmol/L   Glucose, Bld 101 (H) 65 - 99 mg/dL   BUN 16 6 - 20 mg/dL   Creatinine, Ser 0.65 0.61 - 1.24 mg/dL   Calcium 7.7 (L) 8.9 - 10.3 mg/dL   Total Protein 4.8 (L) 6.5 - 8.1 g/dL   Albumin 1.7 (L) 3.5 - 5.0 g/dL   AST 18 15 - 41 U/L   ALT 20 17 - 63 U/L   Alkaline Phosphatase 427 (H) 38 - 126 U/L   Total Bilirubin 1.2 0.3 - 1.2 mg/dL   GFR calc non Af  Amer >60 >60 mL/min   GFR calc Af Amer >60 >60 mL/min    Comment: (NOTE) The eGFR has been calculated using the CKD EPI equation. This calculation has not been validated in all clinical situations. eGFR's persistently <60 mL/min signify possible Chronic Kidney Disease.    Anion gap 9 5 - 15  CBC     Status: Abnormal   Collection Time: 07/18/16 12:46 PM  Result Value Ref Range   WBC 6.7 4.0 - 10.5 K/uL   RBC 3.45 (L) 4.22 - 5.81 Mil/uL   Platelets 233.0 150.0 - 400.0 K/uL   Hemoglobin 9.4 (L) 13.0 - 17.0 g/dL   HCT 29.4 (L) 39.0 - 52.0 %   MCV 85.1 78.0 - 100.0 fl   MCHC 32.1 30.0 - 36.0 g/dL   RDW 16.1 (H) 11.5 - 15.5 %  Comp Met (CMET)     Status: Abnormal   Collection Time: 07/18/16 12:46 PM  Result Value Ref Range   Sodium 139 135 - 145 mEq/L   Potassium 4.0 3.5 - 5.1 mEq/L   Chloride 104 96 - 112 mEq/L   CO2 31 19 - 32 mEq/L   Glucose, Bld 115 (H) 70 - 99 mg/dL   BUN 9 6 - 23 mg/dL   Creatinine, Ser 0.51 0.40 - 1.50 mg/dL   Total Bilirubin 0.8 0.2 - 1.2 mg/dL   Alkaline Phosphatase 425 (H) 39 - 117 U/L   AST 19 0 - 37 U/L   ALT 11 0 - 53 U/L   Total Protein 5.0 (L) 6.0 - 8.3 g/dL   Albumin 2.1 (L) 3.5 - 5.2 g/dL   Calcium 7.6 (L) 8.4 - 10.5 mg/dL   GFR 168.96 >60.00 mL/min  PH, Body Fluid     Status: None   Collection Time: 07/19/16  4:14 PM  Result Value Ref Range   pH, Body Fluid 8.0 Not Estab.    Comment: (NOTE) This test was developed and its performance characteristics determined by [facility]. It has not been cleared or approved by the Food and Drug Administration. Performed At: [facility] [place], [place] [id number] Lindon Romp MD [id number]    Source of Sample PLEU RIGHT   Protein, pleural or peritoneal fluid     Status: None   Collection Time: 07/19/16  4:14 PM  Result Value Ref Range   Total protein, fluid <3.0 g/dL    Comment: (NOTE) No normal range established for this test Results should be evaluated in  conjunction with serum values    Fluid Type-FTP PLEU RIGHT   Body fluid cell count with differential  Status: Abnormal   Collection Time: 07/19/16  4:24 PM  Result Value Ref Range   Fluid Type-FCT FLUID     Comment: RIGHT PLEURAL CORRECTED ON 06/14 AT 1629: PREVIOUSLY REPORTED AS Pleural R    Color, Fluid ORANGE (A) YELLOW   Appearance, Fluid CLOUDY (A) CLEAR   WBC, Fluid 10,250 (H) 0 - 1,000 cu mm   Neutrophil Count, Fluid 82 (H) 0 - 25 %   Lymphs, Fluid 4 %   Monocyte-Macrophage-Serous Fluid 14 (L) 50 - 90 %   Eos, Fluid 0 %   Other Cells, Fluid 0 %  Culture, body fluid-bottle     Status: None   Collection Time: 07/19/16  4:24 PM  Result Value Ref Range   Specimen Description PLEURAL    Special Requests RIGHT PLEURAL    Culture NO GROWTH 5 DAYS    Report Status 07/24/2016 FINAL   Gram stain     Status: None   Collection Time: 07/19/16  4:24 PM  Result Value Ref Range   Specimen Description PLEURAL    Special Requests RIGHT PLEURAL    Gram Stain      ABUNDANT WBC PRESENT, PREDOMINANTLY PMN NO ORGANISMS SEEN    Report Status 07/19/2016 FINAL   Pathologist smear review     Status: None   Collection Time: 07/19/16  4:24 PM  Result Value Ref Range   Path Review Mixed inflammatory cells, predominantly     Comment: neutrophils. Reviewed by Lennox Solders Lyndon Code, M.D. 07/20/2016   Lactate dehydrogenase, body fluid     Status: Abnormal   Collection Time: 07/19/16  4:24 PM  Result Value Ref Range   LD, Fluid 149 (H) 3 - 23 U/L    Comment: (NOTE) Results should be evaluated in conjunction with serum values    Fluid Type-FLDH PLEU RIGHT     Comment: CORRECTED ON 06/15 AT 1300: PREVIOUSLY REPORTED AS Pleural R  Glucose, pleural or peritoneal fluid     Status: None   Collection Time: 07/19/16  4:24 PM  Result Value Ref Range   Glucose, Fluid 113 mg/dL    Comment: (NOTE) No normal range established for this test Results should be evaluated in conjunction with serum values     Fluid Type-FGLU PLEU RIGHT     Comment: CORRECTED ON 06/15 AT 1300: PREVIOUSLY REPORTED AS Pleural R  CBC with Differential/Platelet     Status: Abnormal   Collection Time: 07/19/16  6:18 PM  Result Value Ref Range   WBC 6.4 4.0 - 10.5 K/uL   RBC 3.38 (L) 4.22 - 5.81 MIL/uL   Hemoglobin 9.1 (L) 13.0 - 17.0 g/dL   HCT 28.9 (L) 39.0 - 52.0 %   MCV 85.5 78.0 - 100.0 fL   MCH 26.9 26.0 - 34.0 pg   MCHC 31.5 30.0 - 36.0 g/dL   RDW 16.4 (H) 11.5 - 15.5 %   Platelets 252 150 - 400 K/uL   Neutrophils Relative % 71 %   Neutro Abs 4.6 1.7 - 7.7 K/uL   Lymphocytes Relative 23 %   Lymphs Abs 1.5 0.7 - 4.0 K/uL   Monocytes Relative 4 %   Monocytes Absolute 0.3 0.1 - 1.0 K/uL   Eosinophils Relative 1 %   Eosinophils Absolute 0.0 0.0 - 0.7 K/uL   Basophils Relative 1 %   Basophils Absolute 0.0 0.0 - 0.1 K/uL  Comprehensive metabolic panel     Status: Abnormal   Collection Time: 07/19/16  6:18 PM  Result Value  Ref Range   Sodium 136 135 - 145 mmol/L   Potassium 3.5 3.5 - 5.1 mmol/L   Chloride 102 101 - 111 mmol/L   CO2 26 22 - 32 mmol/L   Glucose, Bld 107 (H) 65 - 99 mg/dL   BUN 5 (L) 6 - 20 mg/dL   Creatinine, Ser 0.54 (L) 0.61 - 1.24 mg/dL   Calcium 7.5 (L) 8.9 - 10.3 mg/dL   Total Protein 5.6 (L) 6.5 - 8.1 g/dL   Albumin 1.8 (L) 3.5 - 5.0 g/dL   AST 24 15 - 41 U/L   ALT 15 (L) 17 - 63 U/L   Alkaline Phosphatase 359 (H) 38 - 126 U/L   Total Bilirubin 0.9 0.3 - 1.2 mg/dL   GFR calc non Af Amer >60 >60 mL/min   GFR calc Af Amer >60 >60 mL/min    Comment: (NOTE) The eGFR has been calculated using the CKD EPI equation. This calculation has not been validated in all clinical situations. eGFR's persistently <60 mL/min signify possible Chronic Kidney Disease.    Anion gap 8 5 - 15  Protime-INR     Status: Abnormal   Collection Time: 07/19/16  6:18 PM  Result Value Ref Range   Prothrombin Time 16.1 (H) 11.4 - 15.2 seconds   INR 1.29   Culture, blood (routine x 2)     Status: None    Collection Time: 07/19/16  6:18 PM  Result Value Ref Range   Specimen Description BLOOD LEFT ANTECUBITAL    Special Requests      BOTTLES DRAWN AEROBIC AND ANAEROBIC Blood Culture adequate volume   Culture NO GROWTH 5 DAYS    Report Status 07/24/2016 FINAL   Culture, blood (routine x 2)     Status: None   Collection Time: 07/19/16  6:18 PM  Result Value Ref Range   Specimen Description BLOOD RIGHT HAND    Special Requests      BOTTLES DRAWN AEROBIC AND ANAEROBIC Blood Culture adequate volume   Culture NO GROWTH 5 DAYS    Report Status 07/24/2016 FINAL   Basic metabolic panel     Status: Abnormal   Collection Time: 07/20/16  3:10 AM  Result Value Ref Range   Sodium 134 (L) 135 - 145 mmol/L   Potassium 3.5 3.5 - 5.1 mmol/L   Chloride 101 101 - 111 mmol/L   CO2 26 22 - 32 mmol/L   Glucose, Bld 100 (H) 65 - 99 mg/dL   BUN <5 (L) 6 - 20 mg/dL   Creatinine, Ser 0.61 0.61 - 1.24 mg/dL   Calcium 7.5 (L) 8.9 - 10.3 mg/dL   GFR calc non Af Amer >60 >60 mL/min   GFR calc Af Amer >60 >60 mL/min    Comment: (NOTE) The eGFR has been calculated using the CKD EPI equation. This calculation has not been validated in all clinical situations. eGFR's persistently <60 mL/min signify possible Chronic Kidney Disease.    Anion gap 7 5 - 15  CBC     Status: Abnormal   Collection Time: 07/20/16  3:10 AM  Result Value Ref Range   WBC 6.0 4.0 - 10.5 K/uL   RBC 3.01 (L) 4.22 - 5.81 MIL/uL   Hemoglobin 8.1 (L) 13.0 - 17.0 g/dL   HCT 25.9 (L) 39.0 - 52.0 %   MCV 86.0 78.0 - 100.0 fL   MCH 26.9 26.0 - 34.0 pg   MCHC 31.3 30.0 - 36.0 g/dL   RDW 16.7 (H) 11.5 -  15.5 %   Platelets 227 150 - 400 K/uL  Vitamin B12     Status: Abnormal   Collection Time: 07/20/16  8:42 AM  Result Value Ref Range   Vitamin B-12 3,488 (H) 180 - 914 pg/mL    Comment: (NOTE) This assay is not validated for testing neonatal or myeloproliferative syndrome specimens for Vitamin B12 levels.   Folate     Status: None    Collection Time: 07/20/16  8:42 AM  Result Value Ref Range   Folate 9.0 >5.9 ng/mL  Iron and TIBC     Status: Abnormal   Collection Time: 07/20/16  8:42 AM  Result Value Ref Range   Iron 10 (L) 45 - 182 ug/dL   TIBC 143 (L) 250 - 450 ug/dL   Saturation Ratios 7 (L) 17.9 - 39.5 %   UIBC 133 ug/dL  Ferritin     Status: None   Collection Time: 07/20/16  8:42 AM  Result Value Ref Range   Ferritin 307 24 - 336 ng/mL  Lactate dehydrogenase     Status: None   Collection Time: 07/20/16 12:27 PM  Result Value Ref Range   LDH 161 98 - 192 U/L  CBC with Differential/Platelet     Status: Abnormal   Collection Time: 07/21/16  3:30 AM  Result Value Ref Range   WBC 7.4 4.0 - 10.5 K/uL   RBC 2.99 (L) 4.22 - 5.81 MIL/uL   Hemoglobin 8.1 (L) 13.0 - 17.0 g/dL   HCT 25.7 (L) 39.0 - 52.0 %   MCV 86.0 78.0 - 100.0 fL   MCH 27.1 26.0 - 34.0 pg   MCHC 31.5 30.0 - 36.0 g/dL   RDW 16.7 (H) 11.5 - 15.5 %   Platelets 233 150 - 400 K/uL   Neutrophils Relative % 76 %   Neutro Abs 5.6 1.7 - 7.7 K/uL   Lymphocytes Relative 16 %   Lymphs Abs 1.2 0.7 - 4.0 K/uL   Monocytes Relative 7 %   Monocytes Absolute 0.5 0.1 - 1.0 K/uL   Eosinophils Relative 1 %   Eosinophils Absolute 0.0 0.0 - 0.7 K/uL   Basophils Relative 0 %   Basophils Absolute 0.0 0.0 - 0.1 K/uL  Comprehensive metabolic panel     Status: Abnormal   Collection Time: 07/21/16  3:30 AM  Result Value Ref Range   Sodium 135 135 - 145 mmol/L   Potassium 4.1 3.5 - 5.1 mmol/L   Chloride 101 101 - 111 mmol/L   CO2 28 22 - 32 mmol/L   Glucose, Bld 101 (H) 65 - 99 mg/dL   BUN 5 (L) 6 - 20 mg/dL   Creatinine, Ser 0.73 0.61 - 1.24 mg/dL   Calcium 7.6 (L) 8.9 - 10.3 mg/dL   Total Protein 5.2 (L) 6.5 - 8.1 g/dL   Albumin 1.6 (L) 3.5 - 5.0 g/dL   AST 28 15 - 41 U/L   ALT 14 (L) 17 - 63 U/L   Alkaline Phosphatase 308 (H) 38 - 126 U/L   Total Bilirubin 0.9 0.3 - 1.2 mg/dL   GFR calc non Af Amer >60 >60 mL/min   GFR calc Af Amer >60 >60 mL/min     Comment: (NOTE) The eGFR has been calculated using the CKD EPI equation. This calculation has not been validated in all clinical situations. eGFR's persistently <60 mL/min signify possible Chronic Kidney Disease.    Anion gap 6 5 - 15  Comp Met (CMET)     Status:  Abnormal   Collection Time: 07/27/16 10:25 AM  Result Value Ref Range   Sodium 138 135 - 145 mEq/L   Potassium 4.2 3.5 - 5.1 mEq/L   Chloride 101 96 - 112 mEq/L   CO2 29 19 - 32 mEq/L   Glucose, Bld 113 (H) 70 - 99 mg/dL   BUN 9 6 - 23 mg/dL   Creatinine, Ser 0.60 0.40 - 1.50 mg/dL   Total Bilirubin 0.5 0.2 - 1.2 mg/dL   Alkaline Phosphatase 259 (H) 39 - 117 U/L   AST 26 0 - 37 U/L   ALT 15 0 - 53 U/L   Total Protein 6.2 6.0 - 8.3 g/dL   Albumin 2.7 (L) 3.5 - 5.2 g/dL   Calcium 8.5 8.4 - 10.5 mg/dL   GFR 140.05 >60.00 mL/min  CBC     Status: Abnormal   Collection Time: 07/27/16 10:25 AM  Result Value Ref Range   WBC 5.1 4.0 - 10.5 K/uL   RBC 3.83 (L) 4.22 - 5.81 Mil/uL   Platelets 217.0 150.0 - 400.0 K/uL   Hemoglobin 10.5 (L) 13.0 - 17.0 g/dL   HCT 32.6 (L) 39.0 - 52.0 %   MCV 85.2 78.0 - 100.0 fl   MCHC 32.0 30.0 - 36.0 g/dL   RDW 17.2 (H) 11.5 - 15.5 %   Assessment/Plan: Pleural effusion, right Resolved with thoracentesis. No growth. Vitals stable. Lungs CTAB. Follow-up with IR scheduled.   Liver abscess No drain in place at present. Patient continues Rocephin via PICC line for total of 2 weeks s/p discharge. Has follow-up with Infectious Disease to discuss transition to oral antibiotics. Sites of prior drains healing well. No drainage from most recent drain site noted. Ostomy bag removed. Skin cleaned, dried. Wound care discussed. Is already healing very nicely. Alarm signs/symptoms discussed with patient.   Normocytic anemia Repeat labs today to further assess. Tolerating iron supplement well without constipation. Will alter regimen based on results.   Streptococcal sepsis ([place]) Resolved. Hepatic abscess  is being appropriately treated with Rocephin. Follow-up with ID as scheduled. Repeat labs today.     Leeanne Rio, PA-C

## 2016-07-29 NOTE — Assessment & Plan Note (Signed)
Repeat labs today to further assess. Tolerating iron supplement well without constipation. Will alter regimen based on results.

## 2016-07-29 NOTE — Assessment & Plan Note (Signed)
Resolved with thoracentesis. No growth. Vitals stable. Lungs CTAB. Follow-up with IR scheduled.

## 2016-07-29 NOTE — Assessment & Plan Note (Signed)
No drain in place at present. Patient continues Rocephin via PICC line for total of 2 weeks s/p discharge. Has follow-up with Infectious Disease to discuss transition to oral antibiotics. Sites of prior drains healing well. No drainage from most recent drain site noted. Ostomy bag removed. Skin cleaned, dried. Wound care discussed. Is already healing very nicely. Alarm signs/symptoms discussed with patient.

## 2016-07-29 NOTE — Assessment & Plan Note (Signed)
Resolved. Hepatic abscess is being appropriately treated with Rocephin. Follow-up with ID as scheduled. Repeat labs today.

## 2016-07-31 ENCOUNTER — Encounter: Payer: Self-pay | Admitting: Physician Assistant

## 2016-07-31 ENCOUNTER — Telehealth: Payer: Self-pay | Admitting: *Deleted

## 2016-07-31 ENCOUNTER — Ambulatory Visit: Payer: Medicare Other | Admitting: Physician Assistant

## 2016-07-31 DIAGNOSIS — D696 Thrombocytopenia, unspecified: Secondary | ICD-10-CM | POA: Diagnosis not present

## 2016-07-31 NOTE — Telephone Encounter (Signed)
Patient states that he was seen by [facility] today. The RN took his pulse and his pulse was a little elevated.  It was 108 bpm. He took it after she left and it was 101 bpm with his blood pressure machine . The patient states he is concern about his heart rate. Patient is requesting a call back. Please advise. Thank you

## 2016-07-31 NOTE — Telephone Encounter (Signed)
He needs to make sure to hydrate well. How has his pulse been since that time? I am not worried about isolated elevated heart rate as I am sure there was a little nerves/anxiety related to the nurse visit. Will you please assess patient? Thank you.

## 2016-08-01 ENCOUNTER — Other Ambulatory Visit (INDEPENDENT_AMBULATORY_CARE_PROVIDER_SITE_OTHER): Payer: Medicare Other

## 2016-08-01 DIAGNOSIS — R309 Painful micturition, unspecified: Secondary | ICD-10-CM | POA: Diagnosis not present

## 2016-08-01 LAB — POCT URINALYSIS DIPSTICK
Bilirubin, UA: NEGATIVE
GLUCOSE UA: NEGATIVE
Ketones, UA: NEGATIVE
Leukocytes, UA: NEGATIVE
NITRITE UA: NEGATIVE
RBC UA: NEGATIVE
Spec Grav, UA: 1.015 (ref 1.010–1.025)
UROBILINOGEN UA: 0.2 U/dL
pH, UA: 6.5 (ref 5.0–8.0)

## 2016-08-01 LAB — CBC AND DIFFERENTIAL
HCT: 33 — AB (ref 41–53)
HEMOGLOBIN: 10.3 — AB (ref 13.5–17.5)
Neutrophils Absolute: 4
Platelets: 245 (ref 150–399)
WBC: 5.7

## 2016-08-01 LAB — BASIC METABOLIC PANEL
BUN: 7 (ref 4–21)
CREATININE: 0.7 (ref <=1.3)

## 2016-08-01 NOTE — Telephone Encounter (Signed)
Spoke with patient regarding symptoms. Patient reports an increased HR yesterday (around 108) while the [facility] nurse was there to change dressing. Patient checked HR several times yesterday afternoon by electronic monitor, down to 80's. Advised patient to drink plenty of fluids and continue to check HR periodically. Patient verbalized understanding.  Patient also reports dysuria and feeling that he is not emptying his bladder, denies fever. Requesting medication to be called in if possible. Pharmacy verified.

## 2016-08-01 NOTE — Telephone Encounter (Signed)
Advised patient the need for at least urine sample. Patient scheduled lab visit for today.

## 2016-08-01 NOTE — Addendum Note (Signed)
Addended by: Katina Dung on: 08/01/2016 02:31 PM   Modules accepted: Orders

## 2016-08-01 NOTE — Telephone Encounter (Signed)
He would need to be seen in office or at least have urine testing before I could give him a medication.

## 2016-08-03 ENCOUNTER — Encounter: Payer: Self-pay | Admitting: Infectious Diseases

## 2016-08-03 ENCOUNTER — Ambulatory Visit (HOSPITAL_COMMUNITY)
Admission: RE | Admit: 2016-08-03 | Discharge: 2016-08-03 | Disposition: A | Discharge status: Home / Self Care | Payer: Medicare Other | Source: Ambulatory Visit | Attending: Cardiology | Admitting: Cardiology

## 2016-08-03 ENCOUNTER — Other Ambulatory Visit: Payer: Self-pay | Admitting: Emergency Medicine

## 2016-08-03 ENCOUNTER — Telehealth: Payer: Self-pay | Admitting: *Deleted

## 2016-08-03 ENCOUNTER — Ambulatory Visit (INDEPENDENT_AMBULATORY_CARE_PROVIDER_SITE_OTHER): Payer: Medicare Other | Admitting: Infectious Diseases

## 2016-08-03 VITALS — BP 149/87 | HR 94 | Temp 97.9°F | Ht 70.0 in | Wt 163.0 lb

## 2016-08-03 DIAGNOSIS — I2510 Atherosclerotic heart disease of native coronary artery without angina pectoris: Secondary | ICD-10-CM | POA: Diagnosis not present

## 2016-08-03 DIAGNOSIS — I352 Nonrheumatic aortic (valve) stenosis with insufficiency: Secondary | ICD-10-CM | POA: Insufficient documentation

## 2016-08-03 DIAGNOSIS — Z452 Encounter for adjustment and management of vascular access device: Secondary | ICD-10-CM

## 2016-08-03 DIAGNOSIS — K750 Abscess of liver: Secondary | ICD-10-CM

## 2016-08-03 LAB — ECHOCARDIOGRAM COMPLETE
Height: 70 in
Weight: 2608 oz

## 2016-08-03 LAB — URINE CULTURE: ORGANISM ID, BACTERIA: NO GROWTH

## 2016-08-03 MED ORDER — POLYSACCHARIDE IRON COMPLEX 150 MG PO CAPS: 150.0000 mg | ORAL_CAPSULE | Freq: Two times a day (BID) | ORAL | Status: DC

## 2016-08-03 MED ORDER — AMOXICILLIN-POT CLAVULANATE 875-125 MG PO TABS: 1.0000 | ORAL_TABLET | Freq: Two times a day (BID) | ORAL | 0 refills | Status: DC

## 2016-08-03 NOTE — Telephone Encounter (Signed)
Per verbal order from Janene Madeira, called [facility] pharmacy to let them know that patient's PICC was discontinued/pulled in the office during visit. Landis Gandy, RN

## 2016-08-03 NOTE — Progress Notes (Signed)
  Echocardiogram 2D Echocardiogram has been performed.  Lanijah Warzecha L Androw 08/03/2016, 3:55 PM

## 2016-08-03 NOTE — Patient Instructions (Signed)
You look great today!  PICC is out - leave bandage on x 24 hours then replace with a band aid.   We are starting you on an antibiotic by mouth Augmentin - take this twice a day for 30 days.   Protein protein protein!

## 2016-08-03 NOTE — Progress Notes (Addendum)
Brandon [name] is a 76 yo male patient here for follow up after ID consultation at [facility] for liver abscess and streptococcal intermedius bacteremia. He was seen by Dr. Linus Salmons during hospitalization.   Hospital Course:  Brandon [name] was admitted recently to [facility] 6/5 - 07/16/16 with fever, 15 lb weight loss and newly diagnosed anemia. Initially started on Rocephin/Flagyl but became progressively more septic and hemodynamically compromised antibiotic therapy was broadened to Meropenem/Vancomycin with clinical improvement. CT scan and ultrasound obtained 6/5 showing 7.4 x 6.1 x 8.2 cm lesion to the right hepatic lobe c/w liver abscess. Percutaneous drain was placed 07/11/16 revealing purulent drainage. Blood and fluid cx Strep Intermedius. He was seen by Dr. Linus Salmons in the hospital and recommended OPAT with 2gm Ceftriaxone QD x 3 weeks (stop date 08/01/16).   He had difficulty with serosanguineous fluid leaking around his drain and follow up CT scan  6/14 revealed worsening pleural effusion, slight decrease in size of the abscess and evidence of a retracted drainage catheter. He was subsequently readmitted 6/14 - 6/16 and US guided thoracentesis was performed - 650 cc serosanguinous exudative fluid removed - cell count >10,000 with neutrophil-predominance and gram stain/culture with no growth. Liver drain was also replaced in a lower location under ultrasound by Dr. Earleen Newport on 07/20/16.   Brandon [name] then returned to drain clinic for a repeat CT scan 6/20 for abdominal tightness and no output from drain. Again this showed uncoiling of drain tip with abscess slightly smaller measuring 6.3 x 6.2 x 6.1 cm with mild amount of fluid around liver/spleen and in pelvis. Drain was removed after it was reported to him there was no further fluid to be removed. Then experienced large amounts of fluid leaking out of of the incision site to where he had to go home wearing a fluid collection system.  Reports he had to drain the system 3 times but when he awoke the next morning the bag was dry. No output continued for 24 more hours in to 6/22 to which he applied a band aid.    HPI:  He presents today for outpatient follow up. He has been feeling great since getting drain out and now reports that his appetite is picking up and ready to start getting his activity back. He reports that he has no longer had problems with abdominal bloating/tightness or swelling of his lower extremities. Incision sites from drains are well healed without erythema, drainage or tenderness. Denies fevers/chills, abdominal pain or tenderness. His wife accompanies him today and reports his skin coloring is back to normal (tells me he looked yellow while he was sick). He has had follow up with his PCP and is currently taking iron supplementation. He is concerned about his blood pressure creeping back up and reports he is not on typical regimen. He has follow up with his cardiologist Dr. Aundra Dubin next week with echocardiogram.    Patient's Medications  New Prescriptions   AMOXICILLIN-CLAVULANATE (AUGMENTIN) 875-125 MG TABLET    Take 1 tablet by mouth 2 (two) times daily.  Previous Medications   AMLODIPINE (NORVASC) 5 MG TABLET    TAKE 1 TABLET(5 MG) BY MOUTH DAILY. One time fill. Further Rx to come from Cardiology.   ASPIRIN 81 MG EC TABLET    Take 81 mg by mouth at bedtime.    CHOLECALCIFEROL (VITAMIN D) 1000 UNITS TABLET    Take 1,000 Units by mouth daily.    DIPHENHYDRAMINE (BENADRYL) 25  MG TABLET    Take 50 mg by mouth every 6 (six) hours as needed for itching or allergies.    HYDROCODONE-ACETAMINOPHEN (NORCO/VICODIN) 5-325 MG TABLET    Take 1 tablet by mouth every 6 (six) hours as needed for moderate pain.   IRON POLYSACCHARIDES (NU-IRON) 150 MG CAPSULE    Take 1 capsule (150 mg total) by mouth daily.   ISOSORBIDE MONONITRATE (IMDUR) 30 MG 24 HR TABLET    TAKE 1 TABLET BY MOUTH AT BEDTIME   METOPROLOL TARTRATE (LOPRESSOR)  25 MG TABLET    Take 0.5 tablets (12.5 mg total) by mouth 2 (two) times daily.   MULTIPLE VITAMINS-MINERALS (MULTIVITAMIN PO)    Take 1 tablet by mouth daily.   OMEGA-3 FATTY ACIDS (FISH OIL) 1000 MG CAPS    Take 2,000 mg by mouth 2 (two) times daily.    OMEPRAZOLE (PRILOSEC) 20 MG CAPSULE    TAKE 1 CAPSULE(20 MG) BY MOUTH DAILY   OXYCODONE (OXY IR/ROXICODONE) 5 MG IMMEDIATE RELEASE TABLET    Take 1 tablet (5 mg total) by mouth every 6 (six) hours as needed for moderate pain.   SENNA-DOCUSATE (SENOKOT-S) 8.6-50 MG TABLET    Take 1 tablet by mouth 2 (two) times daily.  Modified Medications   No medications on file  Discontinued Medications   CEFTRIAXONE 2 G IN DEXTROSE 5 % 50 ML    Inject 2 g into the vein daily. Last dose on 08/01/16    Review of Systems: Review of Systems  Constitutional: Negative for chills, fever, malaise/fatigue and weight loss.  HENT: Negative for sore throat.   Respiratory: Negative for cough, sputum production and shortness of breath.   Cardiovascular: Negative for chest pain, orthopnea and leg swelling.  Gastrointestinal: Positive for constipation. Negative for abdominal pain, diarrhea and vomiting.  Genitourinary: Negative for dysuria and flank pain.  Musculoskeletal: Negative for joint pain, myalgias and neck pain.  Skin: Negative for rash.  Neurological: Positive for weakness. Negative for dizziness, tingling and headaches.  Psychiatric/Behavioral: Positive for depression (situational related to PICC). The patient does not have insomnia.     Past Medical History:  Diagnosis Date  . Aortic stenosis    mild echocardiogram 8/09 EF 60%, showed no regional wall motion abnml, mild LVH, mod focal basal septal hypertrophy and mild dyastolic dysfunction. 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 mean gradient across aortic valve was 8 mmHG. also mild L atrial enlargement and normal RV size and function. minimal AS on LHC in  7/10.   . Arthritis    "all over" (07/19/2016)  . BPH (benign prostatic hypertrophy)   . CAD (coronary artery disease)    LCH (7/10) totally occluded proximal RCA with very robuse L to R collaterals, 50% proximal LAD stenosis, EF 65%, medical management.    . Chicken pox   . Chronic lower back pain    s/p surgical fusion  . Diverticulosis   . Esophageal stricture   . GERD (gastroesophageal reflux disease)   . Heart murmur   . Hepatitis B 1984  . Hiatal hernia   . HLD (hyperlipidemia)   . HTN (hypertension)   . 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 Osteoarthritis   . TIA (transient ischemic attack) 1990s   hx  . Tubular adenoma of colon 2009    Social History  Substance Use Topics  . Smoking status: Former Smoker    Packs/day: 1.50    Years: 50.00    Types:  Cigarettes    Quit date: 06/2015  . Smokeless tobacco: Never Used     Comment: smoked less than 1 ppd for 40+ years; Using Vapor Cig  . Alcohol use 0.0 oz/week     Comment: 07/19/2016 "might have a few drinks/year"    Family History  Problem Relation Age of Onset  . Heart disease Father 37       Living  . Coronary artery disease Father        CABG  . Alzheimer's disease Mother 61       Deceased  . Arthritis/Rheumatoid Mother   . Stomach cancer Maternal Uncle   . Brain cancer Maternal Aunt        x2  . Arthritis Brother        DJD  . Obesity Daughter        Had Bypass Sx   Allergies  Allergen Reactions  . Celecoxib Hives and Itching   OBJECTIVE: Vitals:   08/03/16 0856  BP: (!) 149/87  Pulse: 94  Temp: 97.9 F (36.6 C)  TempSrc: Oral  Weight: 163 lb (73.9 kg)  Height: 5\' 10"  (1.778 m)   Body mass index is 23.39 kg/m.   Physical Exam  Constitutional: He is oriented to person, place, and time and well-developed, well-nourished, and in no distress.  HENT:  Mouth/Throat: No oral lesions. Normal dentition. No dental caries.  Eyes: No scleral icterus.  Cardiovascular: Normal rate, regular rhythm, S1 normal  and S2 normal.   Murmur heard.  Systolic murmur is present  Pulmonary/Chest: Effort normal and breath sounds normal.  Abdominal: Soft. He exhibits no distension and no mass. There is no tenderness. There is no rebound.  2 small incisions to right lateral abdominal wall both well healed. No drainage, erythema, tenderness or warmth at either site.   Musculoskeletal:  Trace edema to b/l LEs  Lymphadenopathy:    He has no cervical adenopathy.  Neurological: He is alert and oriented to person, place, and time.  Skin: Skin is warm and dry. No rash noted.  Psychiatric: Mood and affect normal.    IMAGING:  CT A/P 07/25/16 -  Drainage catheter tip is seen within the multiloculated right hepatic abscess, although its tip is now uncoiled which is new since prior exam. The abscess is slightly smaller compared to prior exam, currently measuring 6.3 x 6.2 x 6.1 cm.  Aortic atherosclerosis.  Mild right pleural effusion with adjacent subsegmental atelectasis.  Mild amount of fluid is noted in the pelvis, as well as around the liver and spleen.  Stable 4.1 cm infrarenal abdominal aortic aneurysm. Recommend followup by ultrasound in 1 year (not discussed with today's exam)   ASSESSMENT & PLAN:  Problem List Items Addressed This Visit      Digestive   Liver abscess - Primary    Brandon [name] looks much improved clinically today. LFTs continue to improve. I feel it is appropriate to transition him to oral antibiotic therapy at this time. Will D/C PICC today and start him on PO Augmentin BID x 4 weeks with continued evidence of abscess on CT scan 07/25/16 after failed drain attempt x 2. Will have him follow up with either myself or Dr. Linus Salmons in 4 weeks prior to finishing his course abx to ensure continued clinical improvement and determine need for follow up imaging. Will also repeat LFTs at this visit to trend.       Relevant Medications   amoxicillin-clavulanate (AUGMENTIN) 875-125 MG tablet  Other   PICC (peripherally inserted central catheter) removal    Single lumen PICC inserted 6/10 at 38 cm removed from right basilic vein. No sutures present. Dressing was clean and dry with small dried blood at insertion site.  Area cleansed with chlorhexidine and petroleum dressing applied. Intact upon removal with some clot noted adhered to outside of PICC mid way up. Good pulse distally to this arm and no evidence of swelling with comparison to contralateral arm. Pt advised no heavy lifting with this arm, leave dressing for 24 hours and call the office if dressing becomes soaked with blood or sharp pain presents. Pt tolerated procedure well.          Return in 3-4 weeks for follow up with continued presence of abscess on CT.  Janene Madeira, MSN, NP-C [facility] [place] Pager: [phone]  08/03/2016, 11:21 AM

## 2016-08-03 NOTE — Assessment & Plan Note (Signed)
Single lumen PICC inserted 6/10 at 38 cm removed from right basilic vein. No sutures present. Dressing was clean and dry with small dried blood at insertion site.  Area cleansed with chlorhexidine and petroleum dressing applied. Intact upon removal with some clot noted adhered to outside of PICC mid way up. Good pulse distally to this arm and no evidence of swelling with comparison to contralateral arm. Pt advised no heavy lifting with this arm, leave dressing for 24 hours and call the office if dressing becomes soaked with blood or sharp pain presents. Pt tolerated procedure well.

## 2016-08-03 NOTE — Assessment & Plan Note (Addendum)
Brandon [name] looks much improved clinically today. LFTs continue to improve. I feel it is appropriate to transition him to oral antibiotic therapy at this time. Will D/C PICC today and start him on PO Augmentin BID x 4 weeks with continued evidence of abscess on CT scan 07/25/16 after failed drain attempt x 2. Will have him follow up with either myself or Dr. Linus Salmons in 4 weeks prior to finishing his course abx to ensure continued clinical improvement and determine need for follow up imaging. Will also repeat LFTs at this visit to trend.

## 2016-08-06 ENCOUNTER — Encounter (HOSPITAL_COMMUNITY): Payer: Self-pay | Admitting: Cardiology

## 2016-08-06 ENCOUNTER — Ambulatory Visit (HOSPITAL_COMMUNITY)
Admission: RE | Admit: 2016-08-06 | Discharge: 2016-08-06 | Disposition: A | Discharge status: Home / Self Care | Payer: Medicare Other | Source: Ambulatory Visit | Attending: Cardiology | Admitting: Cardiology

## 2016-08-06 VITALS — BP 131/77 | HR 73 | Wt 161.0 lb

## 2016-08-06 DIAGNOSIS — Z8673 Personal history of transient ischemic attack (TIA), and cerebral infarction without residual deficits: Secondary | ICD-10-CM | POA: Diagnosis not present

## 2016-08-06 DIAGNOSIS — E784 Other hyperlipidemia: Secondary | ICD-10-CM

## 2016-08-06 DIAGNOSIS — Z87891 Personal history of nicotine dependence: Secondary | ICD-10-CM | POA: Diagnosis not present

## 2016-08-06 DIAGNOSIS — I10 Essential (primary) hypertension: Secondary | ICD-10-CM | POA: Diagnosis not present

## 2016-08-06 DIAGNOSIS — Z9049 Acquired absence of other specified parts of digestive tract: Secondary | ICD-10-CM | POA: Diagnosis not present

## 2016-08-06 DIAGNOSIS — K219 Gastro-esophageal reflux disease without esophagitis: Secondary | ICD-10-CM | POA: Diagnosis not present

## 2016-08-06 DIAGNOSIS — N400 Enlarged prostate without lower urinary tract symptoms: Secondary | ICD-10-CM | POA: Diagnosis not present

## 2016-08-06 DIAGNOSIS — I350 Nonrheumatic aortic (valve) stenosis: Secondary | ICD-10-CM | POA: Diagnosis not present

## 2016-08-06 DIAGNOSIS — E785 Hyperlipidemia, unspecified: Secondary | ICD-10-CM | POA: Diagnosis not present

## 2016-08-06 DIAGNOSIS — I2510 Atherosclerotic heart disease of native coronary artery without angina pectoris: Secondary | ICD-10-CM | POA: Diagnosis not present

## 2016-08-06 DIAGNOSIS — Z8249 Family history of ischemic heart disease and other diseases of the circulatory system: Secondary | ICD-10-CM | POA: Diagnosis not present

## 2016-08-06 DIAGNOSIS — Z79899 Other long term (current) drug therapy: Secondary | ICD-10-CM | POA: Diagnosis not present

## 2016-08-06 DIAGNOSIS — Z7982 Long term (current) use of aspirin: Secondary | ICD-10-CM | POA: Diagnosis not present

## 2016-08-06 DIAGNOSIS — E7849 Other hyperlipidemia: Secondary | ICD-10-CM

## 2016-08-06 DIAGNOSIS — M1990 Unspecified osteoarthritis, unspecified site: Secondary | ICD-10-CM | POA: Insufficient documentation

## 2016-08-06 DIAGNOSIS — Z8546 Personal history of malignant neoplasm of prostate: Secondary | ICD-10-CM | POA: Insufficient documentation

## 2016-08-06 MED ORDER — SIMVASTATIN 20 MG PO TABS: 20.0000 mg | ORAL_TABLET | Freq: Every day | ORAL | 3 refills | Status: DC

## 2016-08-06 NOTE — Progress Notes (Signed)
Patient ID: Brandon [name], male   DOB: Jun 22, 1940, 76 y.o.   MRN: [id number] PCP: Elyn Aquas Cardiology: Dr. Aundra Dubin  76 yo with history of CAD, HTN, and hyperlipidemia presents for followup.  Patient had described some exertional chest pain and myoview showed a partially reversible inferior defect, so LHC was done in 7/10.  This showed that his proximal RCA was totally occluded.  There were very robust left to right collaterals.  There was also a 50% proximal LAD stenosis.  The patient was managed medically.    Most recent echo in 6/18 showed EF 55-60% with moderate AS.    Earlier in 06-01-16, he developed a Strep intermedius liver abscess.  He had a hepatic drain placed and also had a right pleural effusion requiring diuresis.  Tube is now out.  It appears that simvastatin was stopped during one of his hospitalizations for liver abscess.   He is generally doing well though not very active. No lightheadedness, palpitations.  He lost a fair amount of weight while being treated for abscess (18 lbs down since last practice).  No chest pain.   Labs (12/10): HDL 28, LDL 70, K 4.3, creatinine 0.8 Labs (4/11): K 4, creatinine 0.8, LDL 53, HDL 26, LFTs normal Labs (12/11): LDL 74, HDL 27, K 4, creatinine 0.9, LFTs normal, TSH normal Labs (7/12): LDL 87, HDL 33 Labs (9/12): LDL 66, HDL 33, LFTs normal Labs (9/13): LDL 62, HDL 30, K 4.1, creatinine 0.8 Labs (4/14): LDL 88, HDL 27, K 4.4, creatinine 0.8 Labs (4/15): K 4.4, creatinine 0.76 Labs (4/16): LDL 69, HDL 32 Labs (11/16): K 4.3, creatinine 0.76, LDL 68, HDL 31 Labs (1/18): LDL 72 Labs (6/18): K 4.2, creatinine 0.6, LFTs normal, hgb 8.1.   ECG: Personally reviewed, NSR, normal.   Allergies (verified):  1)  ! Celebrex  Past Medical History: 1. Hyperlipidemia: Myalgias with atorvastatin and Crestor. 2. Aortic stenosis.  Echocardiogram in August 2009 showed EF 60%, no regional wall motion abnormalities, mild LVH, moderate focal basal septal  hypertrophy, and mild diastolic dysfunction.  There was a very mild aortic stenosis with partially fused left and right coronary cusps and some restricted motion of the aortic valve.  The mean gradient, however, across the aortic valve was only 8 mmHg.  There was also mild left atrial enlargement and normal RV size and function.  Minimal AS on LHC in 7/10.  Echo (7/14) with EF 60-65%, mild LVH, mild AS mean gradient 13 mmHg.  Echo (6/16) with EF 55-60%, mild AS with mean gradient 14 mmHg.  - Echo (6/18): EF 55-60%, mild LVH, moderate AS mean gradient 27 mmHg, mild AI.  3. Hypertension. 4. Gastroesophageal reflux disease: h/o esophageal stricture.  5. BPH. 6. Diverticulosis. 7. Smoked until 7/10. 8. CAD: LHC (7/10) showed totally occluded proximal RCA with very robust left to right collaterals, 50% proximal LAD stenosis, EF 65%.  Medical management.   9. Osteoarthritis 10. Colonic polyps 11. h/o TIA 12. 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  This manifested with slurred speech. 13. History of low back pain.  The patient is status post surgical back fusion. 14. History of cholecystectomy. 15. Cataract surgery 16. Prostate cancer: treated 76. Liver abscess: Strep intermedius, 06/01/16.   Family History: Father alive age 96 w/ CAD & CABG at [place] in 2007-06-02 Mother died age 41 w/ Alzheimer's disease, hx of DJD 1 Sibling- Brother alive & well w/ DJD in his hips  Social History: Married- 25rd marriage,  wife= Etta 2 children from 1st marriage, 4 step children Former smoker, 40+yrs of >1ppd, quit 7/10.  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    Current Outpatient Prescriptions  Medication Sig Dispense Refill  . amLODipine (NORVASC) 5 MG tablet TAKE 1 TABLET(5 MG) BY MOUTH DAILY. One time fill. Further Rx to come from Cardiology. 30  tablet 0  . amoxicillin-clavulanate (AUGMENTIN) 875-125 MG tablet Take 1 tablet by mouth 2 (two) times daily. 60 tablet 0  . aspirin 81 MG EC tablet Take 81 mg by mouth at bedtime.     . cholecalciferol (VITAMIN D) 1000 UNITS tablet Take 1,000 Units by mouth daily.     . diphenhydrAMINE (BENADRYL) 25 MG tablet Take 50 mg by mouth every 6 (six) hours as needed for itching or allergies.     Marland Kitchen HYDROcodone-acetaminophen (NORCO/VICODIN) 5-325 MG tablet Take 1 tablet by mouth every 6 (six) hours as needed for moderate pain.    . iron polysaccharides (NU-IRON) 150 MG capsule Take 1 capsule (150 mg total) by mouth 2 (two) times daily.    . isosorbide mononitrate (IMDUR) 30 MG 24 hr tablet TAKE 1 TABLET BY MOUTH AT BEDTIME 90 tablet 1  . metoprolol tartrate (LOPRESSOR) 25 MG tablet Take 0.5 tablets (12.5 mg total) by mouth 2 (two) times daily. 60 tablet 0  . Multiple Vitamins-Minerals (MULTIVITAMIN PO) Take 1 tablet by mouth daily.    . Omega-3 Fatty Acids (FISH OIL) 1000 MG CAPS Take 2,000 mg by mouth 2 (two) times daily.     Marland Kitchen omeprazole (PRILOSEC) 20 MG capsule TAKE 1 CAPSULE(20 MG) BY MOUTH DAILY 30 capsule 11  . oxyCODONE (OXY IR/ROXICODONE) 5 MG immediate release tablet Take 1 tablet (5 mg total) by mouth every 6 (six) hours as needed for moderate pain. 20 tablet 0  . senna-docusate (SENOKOT-S) 8.6-50 MG tablet Take 1 tablet by mouth 2 (two) times daily.    . simvastatin (ZOCOR) 20 MG tablet Take 1 tablet (20 mg total) by mouth at bedtime. 90 tablet 3   No current facility-administered medications for this encounter.     BP 131/77   Pulse 73   Wt 161 lb (73 kg)   SpO2 99%   BMI 23.10 kg/m  General:  Well developed, well nourished, in no acute distress. Neck:  Neck supple, JVP 7 cm. No masses, thyromegaly or abnormal cervical nodes. Lungs:  CTAB Heart:  Non-displaced PMI, chest non-tender; regular rate and rhythm, S1, S2 without  rubs or gallops.  3/6 systolic crescendo-decrescendo murmur at  RUSB with clear S2, radiates to neck.  Carotid upstroke normal, no bruit.  Pedals normal pulses. No edema, no varicosities. Abdomen:  Bowel sounds positive; abdomen soft and non-tender without masses, organomegaly, or hernias noted. No hepatosplenomegaly. Extremities:  No clubbing or cyanosis. Neurologic:  Alert and oriented x 3. Psych:  Normal affect.  Assessment/Plan:  AORTIC STENOSIS  Moderate AS on 6/18 echo, consistent with exam.  Need to follow.    CAD Stable with no ischemic symptoms. Continue ASA, beta blocker, ACEI.  Restart statin (see below).  HYPERLIPIDEMIA  He is off statin. Restart simvastatin 20 mg daily (prior dose).  LIpids/LFTs in 2 months.  HYPERTENSION BP is under good control.  SMOKING Staying off cigarettes.   Followup in 1 year with echo.   Loralie Champagne 08/06/2016

## 2016-08-06 NOTE — Patient Instructions (Signed)
Restart Simvastatin 20 mg daily  Your physician recommends that you return for a FASTING lipid profile: 2 months  We will contact you in 6 months to schedule your next appointment.

## 2016-08-09 ENCOUNTER — Ambulatory Visit (INDEPENDENT_AMBULATORY_CARE_PROVIDER_SITE_OTHER): Payer: Medicare Other | Admitting: Physician Assistant

## 2016-08-09 ENCOUNTER — Encounter: Payer: Self-pay | Admitting: Physician Assistant

## 2016-08-09 VITALS — BP 120/70 | HR 97 | Temp 98.7°F | Resp 14 | Ht 70.0 in | Wt 160.0 lb

## 2016-08-09 DIAGNOSIS — K750 Abscess of liver: Secondary | ICD-10-CM

## 2016-08-09 DIAGNOSIS — D649 Anemia, unspecified: Secondary | ICD-10-CM

## 2016-08-09 LAB — COMPREHENSIVE METABOLIC PANEL
ALK PHOS: 188 U/L — AB (ref 39–117)
ALT: 24 U/L (ref 0–53)
AST: 26 U/L (ref 0–37)
Albumin: 3.4 g/dL — ABNORMAL LOW (ref 3.5–5.2)
BILIRUBIN TOTAL: 0.5 mg/dL (ref 0.2–1.2)
BUN: 9 mg/dL (ref 6–23)
CALCIUM: 9.1 mg/dL (ref 8.4–10.5)
CO2: 29 meq/L (ref 19–32)
Chloride: 100 mEq/L (ref 96–112)
Creatinine, Ser: 0.6 mg/dL (ref 0.40–1.50)
GFR: 140.04 mL/min (ref >60.00)
Glucose, Bld: 123 mg/dL — ABNORMAL HIGH (ref 70–99)
POTASSIUM: 4.1 meq/L (ref 3.5–5.1)
Sodium: 135 mEq/L (ref 135–145)
Total Protein: 7.3 g/dL (ref 6.0–8.3)

## 2016-08-09 LAB — CBC
HEMATOCRIT: 38 % — AB (ref 39.0–52.0)
HEMOGLOBIN: 12.3 g/dL — AB (ref 13.0–17.0)
MCHC: 32.4 g/dL (ref 30.0–36.0)
MCV: 85.2 fl (ref 78.0–100.0)
PLATELETS: 208 10*3/uL (ref 150.0–400.0)
RBC: 4.46 Mil/uL (ref 4.22–5.81)
RDW: 17.5 % — ABNORMAL HIGH (ref 11.5–15.5)
WBC: 7.2 10*3/uL (ref 4.0–10.5)

## 2016-08-09 MED ORDER — OXYCODONE HCL 5 MG PO TABS: 5.0000 mg | ORAL_TABLET | Freq: Four times a day (QID) | ORAL | 0 refills | Status: DC | PRN

## 2016-08-09 NOTE — Patient Instructions (Addendum)
Please go to the lab for blood work. I will call you with your results.   Please continue regimen as directed.  Start a daily probiotic.  Keep an eye on weight -- at this point it should level out.  Keep a consistent diet.   Follow-up with me will be based on results. Follow-up with specialists as scheduled.

## 2016-08-09 NOTE — Progress Notes (Signed)
Patient presents to clinic today for subsequent follow-up regarding recent hepatic abscess and noted anemia. Patient's hgb noted to be at 10.5 at last visit. Had iron supplement increased to twice daily. Is taking as directed, initially noting mild constipation that is alleviated with OTC bowel regimen. Notes his appetite is good now but he is not overeating as he was before all of this started. Notes good urinary output. Denies recurrence of peripheral edema or ascites. Denies fever, chills. Fatigue is still present but much improved. Patient is trying to work up to his baseline activity level. Denies new concerns at today's visit.   Past Medical History:  Diagnosis Date  . Aortic stenosis    mild echocardiogram 8/09 EF 60%, showed no regional wall motion abnml, mild LVH, mod focal basal septal hypertrophy and mild dyastolic dysfunction. 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 mean gradient across aortic valve was 8 mmHG. also mild L atrial enlargement and normal RV size and function. minimal AS on LHC in 7/10.   . Arthritis    "all over" (07/19/2016)  . BPH (benign prostatic hypertrophy)   . CAD (coronary artery disease)    LCH (7/10) totally occluded proximal RCA with very robuse L to R collaterals, 50% proximal LAD stenosis, EF 65%, medical management.    . Chicken pox   . Chronic lower back pain    s/p surgical fusion  . Diverticulosis   . Esophageal stricture   . GERD (gastroesophageal reflux disease)   . Heart murmur   . Hepatitis B 1984  . Hiatal hernia   . HLD (hyperlipidemia)   . HTN (hypertension)   . 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 Osteoarthritis   . TIA (transient ischemic attack) 1990s   hx  . Tubular adenoma of colon 2009    Current Outpatient Prescriptions on File Prior to Visit  Medication Sig Dispense Refill  . amLODipine (NORVASC) 5 MG tablet TAKE 1 TABLET(5 MG) BY MOUTH DAILY. One time fill. Further Rx to come from  Cardiology. 30 tablet 0  . amoxicillin-clavulanate (AUGMENTIN) 875-125 MG tablet Take 1 tablet by mouth 2 (two) times daily. 60 tablet 0  . aspirin 81 MG EC tablet Take 81 mg by mouth at bedtime.     . cholecalciferol (VITAMIN D) 1000 UNITS tablet Take 1,000 Units by mouth daily.     . diphenhydrAMINE (BENADRYL) 25 MG tablet Take 50 mg by mouth every 6 (six) hours as needed for itching or allergies.     Marland Kitchen iron polysaccharides (NU-IRON) 150 MG capsule Take 1 capsule (150 mg total) by mouth 2 (two) times daily.    . isosorbide mononitrate (IMDUR) 30 MG 24 hr tablet TAKE 1 TABLET BY MOUTH AT BEDTIME 90 tablet 1  . metoprolol tartrate (LOPRESSOR) 25 MG tablet Take 0.5 tablets (12.5 mg total) by mouth 2 (two) times daily. 60 tablet 0  . Multiple Vitamins-Minerals (MULTIVITAMIN PO) Take 1 tablet by mouth daily.    Marland Kitchen omeprazole (PRILOSEC) 20 MG capsule TAKE 1 CAPSULE(20 MG) BY MOUTH DAILY 30 capsule 11  . oxyCODONE (OXY IR/ROXICODONE) 5 MG immediate release tablet Take 1 tablet (5 mg total) by mouth every 6 (six) hours as needed for moderate pain. 20 tablet 0  . senna-docusate (SENOKOT-S) 8.6-50 MG tablet Take 1 tablet by mouth 2 (two) times daily.    . simvastatin (ZOCOR) 20 MG tablet Take 1 tablet (20 mg total) by mouth at bedtime. 90 tablet 3  No current facility-administered medications on file prior to visit.     Allergies  Allergen Reactions  . Celecoxib Hives and Itching    Family History  Problem Relation Age of Onset  . Heart disease Father 90       Living  . Coronary artery disease Father        CABG  . Alzheimer's disease Mother 63       Deceased  . Arthritis/Rheumatoid Mother   . Stomach cancer Maternal Uncle   . Brain cancer Maternal Aunt        x2  . Arthritis Brother        DJD  . Obesity Daughter        Had Bypass Sx    Social History   Social History  . Marital status: Married    Spouse name: etta  . Number of children: 2  . Years of education: N/A    Occupational History  . retired Retired    disabled due to back problems   Social History Main Topics  . Smoking status: Former Smoker    Packs/day: 1.50    Years: 50.00    Types: Cigarettes    Quit date: 06/2015  . Smokeless tobacco: Never Used     Comment: smoked less than 1 ppd for 40+ years; Using Vapor Cig  . Alcohol use 0.0 oz/week     Comment: 07/19/2016 "might have a few drinks/year"  . Drug use: No  . Sexual activity: No   Other Topics Concern  . None   Social History Narrative   Married (3rd), [place]. 2 children from 1st marriage, 4 step children.    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       Cell # [id number]   Review of Systems - See HPI.  All other ROS are negative.  BP 120/70   Pulse 97   Temp 98.7 F (37.1 C) (Oral)   Resp 14   Ht _0  (1.778 m)   Wt 160 lb (72.6 kg)   SpO2 98%   BMI 22.96 kg/m   Physical Exam  Constitutional: He is oriented to person, place, and time and well-developed, well-nourished, and in no distress.  HENT:  Head: Normocephalic and atraumatic.  Neck: Neck supple.  Cardiovascular: Normal rate, regular rhythm, normal heart sounds and intact distal pulses.   Pulses:      Dorsalis pedis pulses are 2+ on the right side, and 2+ on the left side.       Posterior tibial pulses are 2+ on the right side, and 2+ on the left side.  No edema on examination today.  Pulmonary/Chest: Effort normal and breath sounds normal. No respiratory distress. He has no wheezes. He has no rales. He exhibits no tenderness.  Abdominal: Soft. Bowel sounds are normal. He exhibits no distension and no mass. There is no tenderness. There is no rebound and no guarding.  Neurological: He is alert and oriented to person, place, and time.  Skin: Skin is warm and dry. No rash noted.  Psychiatric: Affect normal.  Vitals reviewed.   Recent Results (from the past 2160  hour(s))  Comprehensive metabolic panel     Status: Abnormal   Collection Time: 06/16/16  9:20 AM  Result Value Ref Range   Sodium 136 135 - 145 mmol/L   Potassium 3.3 (L) 3.5 - 5.1 mmol/L   Chloride 104  101 - 111 mmol/L   CO2 25 22 - 32 mmol/L   Glucose, Bld 171 (H) 65 - 99 mg/dL   BUN 9 6 - 20 mg/dL   Creatinine, Ser 0.82 0.61 - 1.24 mg/dL   Calcium 8.6 (L) 8.9 - 10.3 mg/dL   Total Protein 6.7 6.5 - 8.1 g/dL   Albumin 3.4 (L) 3.5 - 5.0 g/dL   AST 21 15 - 41 U/L   ALT 20 17 - 63 U/L   Alkaline Phosphatase 114 38 - 126 U/L   Total Bilirubin 0.6 0.3 - 1.2 mg/dL   GFR calc non Af Amer >60 >60 mL/min   GFR calc Af Amer >60 >60 mL/min    Comment: (NOTE) The eGFR has been calculated using the CKD EPI equation. This calculation has not been validated in all clinical situations. eGFR's persistently <60 mL/min signify possible Chronic Kidney Disease.    Anion gap 7 5 - 15  Lipase, blood     Status: None   Collection Time: 06/16/16  9:20 AM  Result Value Ref Range   Lipase 14 11 - 51 U/L  Troponin I     Status: None   Collection Time: 06/16/16  9:20 AM  Result Value Ref Range   Troponin I <0.03 <0.03 ng/mL  CBC with Differential     Status: Abnormal   Collection Time: 06/16/16  9:20 AM  Result Value Ref Range   WBC 9.8 4.0 - 10.5 K/uL   RBC 4.77 4.22 - 5.81 MIL/uL   Hemoglobin 13.6 13.0 - 17.0 g/dL   HCT 40.4 39.0 - 52.0 %   MCV 84.7 78.0 - 100.0 fL   MCH 28.5 26.0 - 34.0 pg   MCHC 33.7 30.0 - 36.0 g/dL   RDW 13.5 11.5 - 15.5 %   Platelets 143 (L) 150 - 400 K/uL   Neutrophils Relative % 80 %   Neutro Abs 7.9 (H) 1.7 - 7.7 K/uL   Lymphocytes Relative 11 %   Lymphs Abs 1.1 0.7 - 4.0 K/uL   Monocytes Relative 8 %   Monocytes Absolute 0.8 0.1 - 1.0 K/uL   Eosinophils Relative 1 %   Eosinophils Absolute 0.1 0.0 - 0.7 K/uL   Basophils Relative 0 %   Basophils Absolute 0.0 0.0 - 0.1 K/uL  Urinalysis, Routine w reflex microscopic     Status: Abnormal   Collection Time:  06/16/16  9:20 AM  Result Value Ref Range   Color, Urine YELLOW YELLOW   APPearance CLEAR CLEAR   Specific Gravity, Urine 1.020 1.005 - 1.030   pH 5.0 5.0 - 8.0   Glucose, UA 150 (A) NEGATIVE mg/dL   Hgb urine dipstick SMALL (A) NEGATIVE   Bilirubin Urine NEGATIVE NEGATIVE   Ketones, ur NEGATIVE NEGATIVE mg/dL   Protein, ur 30 (A) NEGATIVE mg/dL   Nitrite NEGATIVE NEGATIVE   Leukocytes, UA NEGATIVE NEGATIVE   RBC / HPF 0-5 0 - 5 RBC/hpf   WBC, UA 0-5 0 - 5 WBC/hpf   Bacteria, UA NONE SEEN NONE SEEN   Squamous Epithelial / LPF NONE SEEN NONE SEEN   Mucous PRESENT   Rocky mtn spotted fvr abs pnl(IgG+IgM)     Status: None   Collection Time: 06/16/16  9:20 AM  Result Value Ref Range   RMSF IgG Negative Negative   RMSF IgM 0.42 0.00 - 0.89 index    Comment: (NOTE)  Negative        <0.90                                 Equivocal 0.90 - 1.10                                 Positive        >1.10 Performed At: [facility] [place], [place] [id number] Lindon Romp MD [id number]   B. Claris Che antibodies     Status: None   Collection Time: 06/16/16  9:20 AM  Result Value Ref Range   B burgdorferi Ab IgG+IgM <0.91 0.00 - 0.90 ISR    Comment: (NOTE)                                Negative         <0.91                                Equivocal  0.91 - 1.09                                Positive         >1.09 Performed At: [facility] [place], [place] [id number] Lindon Romp MD [id number]   I-Stat CG4 Lactic Acid, ED     Status: None   Collection Time: 06/16/16  9:40 AM  Result Value Ref Range   Lactic Acid, Venous 1.76 0.5 - 1.9 mmol/L  CBC with Differential/Platelet     Status: Abnormal   Collection Time: 07/04/16 10:52 AM  Result Value Ref Range   WBC 8.5 4.0 - 10.5 K/uL   RBC 4.65 4.22 - 5.81 Mil/uL   Hemoglobin 13.0 13.0 - 17.0 g/dL   HCT 39.1 39.0 - 52.0 %   MCV 84.0 78.0 - 100.0  fl   MCHC 33.1 30.0 - 36.0 g/dL   RDW 14.1 11.5 - 15.5 %   Platelets 216.0 150.0 - 400.0 K/uL   Neutrophils Relative % 79.4 (H) 43.0 - 77.0 %   Lymphocytes Relative 13.1 12.0 - 46.0 %   Monocytes Relative 6.6 3.0 - 12.0 %   Eosinophils Relative 0.6 0.0 - 5.0 %   Basophils Relative 0.3 0.0 - 3.0 %   Neutro Abs 6.8 1.4 - 7.7 K/uL   Lymphs Abs 1.1 0.7 - 4.0 K/uL   Monocytes Absolute 0.6 0.1 - 1.0 K/uL   Eosinophils Absolute 0.1 0.0 - 0.7 K/uL   Basophils Absolute 0.0 0.0 - 0.1 K/uL  Comprehensive metabolic panel     Status: Abnormal   Collection Time: 07/04/16 10:52 AM  Result Value Ref Range   Sodium 138 135 - 145 mEq/L   Potassium 4.0 3.5 - 5.1 mEq/L   Chloride 102 96 - 112 mEq/L   CO2 30 19 - 32 mEq/L   Glucose, Bld 122 (H) 70 - 99 mg/dL   BUN 8 6 - 23 mg/dL   Creatinine, Ser 0.76 0.40 - 1.50 mg/dL   Total Bilirubin 0.7 0.2 - 1.2 mg/dL   Alkaline Phosphatase 173 (H) 39 - 117 U/L   AST 17 0 - 37 U/L  ALT 18 0 - 53 U/L   Total Protein 6.4 6.0 - 8.3 g/dL   Albumin 3.6 3.5 - 5.2 g/dL   Calcium 9.0 8.4 - 10.5 mg/dL   GFR 106.63 >60.00 mL/min  TSH     Status: None   Collection Time: 07/04/16 10:52 AM  Result Value Ref Range   TSH 0.95 0.35 - 4.50 uIU/mL  CMV abs, IgG+IgM (cytomegalovirus)     Status: Abnormal   Collection Time: 07/04/16 10:52 AM  Result Value Ref Range   Cytomegalovirus Ab-IgG 6.10 (H) <0.60 U/mL    Comment:        Value         Interpretation      =====         ==============      < 0.60         Negative      0.61-0.69      Equivocal      >= 0.70        Positive   A positive result indicates that the patient has antibody to CMV. It does not differentiate between an active or past infection.      CMV IgM <30.00 <30.00 AU/mL    Comment:        Value           Interpretation      =====           ==============      < 30.00         Negative      30.00-34.99     Equivocal      >= 35.00        Positive   Results from any one IgM assay should not be  used as a sole determinant of a current or recent infection. Because an IgM test can yield false positive results and low level IgM antibody may persist for more than 12 months post infection, reliance on a single test result could be misleading. Acute infection is best diagnosed by demonstrating the conversion of IgG from negative to positive. If an acute infection is suspected, consider obtaining a new specimen and submit for both IgG and IgM testing in two or more weeks.     Epstein-Barr Virus VCA Antibody Panel     Status: Abnormal   Collection Time: 07/04/16 10:52 AM  Result Value Ref Range   EBV VCA IgG >750.00 (H) U/mL    Comment:                                 U/mL         Interpretation                          -------------     --------------                                < 18.00       Negative                          18.00 - 21.99       Equivocal                                >  21.99       Positive      EBV VCA IgM <36.00 U/mL    Comment:                                  U/mL         Interpretation                           -------------     --------------                                 < 36.00       Negative                           36.00 - 43.99       Equivocal                                 > 43.99       Positive      EBV NA IgG 261.00 (H) U/mL    Comment:                                 U/mL         Interpretation                          -------------     --------------                                < 18.00       Negative                          18.00 - 21.99       Equivocal                                > 21.99       Positive      Interpretation SEE NOTE     Comment:   Suggestive of a past Epstein-Barr Virus infection.  In infants, a similar pattern may occur as a result of passive maternal transfer of antibody.     Sed Rate (ESR)     Status: Abnormal   Collection Time: 07/04/16 10:52 AM  Result Value Ref Range   Sed Rate 33 (H) 0 - 20  mm/hr  POCT Urinalysis Dipstick     Status: Abnormal   Collection Time: 07/04/16 10:53 AM  Result Value Ref Range   Color, UA yellow    Clarity, UA clear    Glucose, UA negative    Bilirubin, UA negative    Ketones, UA negative    Spec Grav, UA 1.010 1.010 - 1.025   Blood, UA negative    pH, UA 7.5 5.0 - 8.0   Protein, UA trace    Urobilinogen, UA 1.0 0.2 or 1.0 E.U./dL   Nitrite, UA negative    Leukocytes, UA Negative Negative  Comprehensive metabolic panel  Status: Abnormal   Collection Time: 07/06/16  9:22 PM  Result Value Ref Range   Sodium 137 135 - 145 mmol/L   Potassium 3.3 (L) 3.5 - 5.1 mmol/L   Chloride 102 101 - 111 mmol/L   CO2 25 22 - 32 mmol/L   Glucose, Bld 116 (H) 65 - 99 mg/dL   BUN 12 6 - 20 mg/dL   Creatinine, Ser 0.89 0.61 - 1.24 mg/dL   Calcium 8.5 (L) 8.9 - 10.3 mg/dL   Total Protein 7.1 6.5 - 8.1 g/dL   Albumin 3.1 (L) 3.5 - 5.0 g/dL   AST 28 15 - 41 U/L   ALT 28 17 - 63 U/L   Alkaline Phosphatase 196 (H) 38 - 126 U/L   Total Bilirubin 1.2 0.3 - 1.2 mg/dL   GFR calc non Af Amer >60 >60 mL/min   GFR calc Af Amer >60 >60 mL/min    Comment: (NOTE) The eGFR has been calculated using the CKD EPI equation. This calculation has not been validated in all clinical situations. eGFR's persistently <60 mL/min signify possible Chronic Kidney Disease.    Anion gap 10 5 - 15  CBC with Differential     Status: Abnormal   Collection Time: 07/06/16  9:22 PM  Result Value Ref Range   WBC 13.0 (H) 4.0 - 10.5 K/uL   RBC 4.46 4.22 - 5.81 MIL/uL   Hemoglobin 12.3 (L) 13.0 - 17.0 g/dL   HCT 37.5 (L) 39.0 - 52.0 %   MCV 84.1 78.0 - 100.0 fL   MCH 27.6 26.0 - 34.0 pg   MCHC 32.8 30.0 - 36.0 g/dL   RDW 13.5 11.5 - 15.5 %   Platelets 165 150 - 400 K/uL   Neutrophils Relative % 84 %   Neutro Abs 11.0 (H) 1.7 - 7.7 K/uL   Lymphocytes Relative 7 %   Lymphs Abs 1.0 0.7 - 4.0 K/uL   Monocytes Relative 9 %   Monocytes Absolute 1.1 (H) 0.1 - 1.0 K/uL   Eosinophils  Relative 0 %   Eosinophils Absolute 0.0 0.0 - 0.7 K/uL   Basophils Relative 0 %   Basophils Absolute 0.0 0.0 - 0.1 K/uL  Protime-INR     Status: Abnormal   Collection Time: 07/06/16  9:22 PM  Result Value Ref Range   Prothrombin Time 15.7 (H) 11.4 - 15.2 seconds   INR 1.25   Culture, blood (Routine x 2)     Status: None   Collection Time: 07/06/16  9:22 PM  Result Value Ref Range   Specimen Description BLOOD LEFT FOREARM    Special Requests BOTTLES DRAWN AEROBIC AND ANAEROBIC BCAV    Culture      NO GROWTH 5 DAYS Performed at [facility], [address], [place], [place] [address]    Report Status 07/12/2016 FINAL   Culture, blood (Routine x 2)     Status: None   Collection Time: 07/06/16  9:27 PM  Result Value Ref Range   Specimen Description BLOOD RAC    Special Requests BOTTLES DRAWN AEROBIC AND ANAEROBIC BCAV    Culture      NO GROWTH 5 DAYS Performed at [facility], [place] [address] [place], [place] [address]    Report Status 07/12/2016 FINAL   I-Stat CG4 Lactic Acid, ED     Status: None   Collection Time: 07/06/16  9:44 PM  Result Value Ref Range   Lactic Acid, Venous 1.23 0.5 - 1.9 mmol/L  Urinalysis, Routine w reflex microscopic     Status: Abnormal   Collection Time: 07/06/16  9:55 PM  Result Value Ref Range   Color, Urine AMBER (A) YELLOW    Comment: BIOCHEMICALS MAY BE AFFECTED BY COLOR   APPearance CLEAR CLEAR   Specific Gravity, Urine 1.019 1.005 - 1.030   pH 5.0 5.0 - 8.0   Glucose, UA NEGATIVE NEGATIVE mg/dL   Hgb urine dipstick SMALL (A) NEGATIVE   Bilirubin Urine NEGATIVE NEGATIVE   Ketones, ur NEGATIVE NEGATIVE mg/dL   Protein, ur 30 (A) NEGATIVE mg/dL   Nitrite NEGATIVE NEGATIVE   Leukocytes, UA NEGATIVE NEGATIVE   RBC / HPF 0-5 0 - 5 RBC/hpf   WBC, UA 0-5 0 - 5 WBC/hpf   Bacteria, UA NONE SEEN NONE SEEN   Squamous Epithelial / LPF NONE SEEN NONE SEEN   Mucous PRESENT   Acute Hep Panel & Hep B Surface Ab     Status: None    Collection Time: 07/09/16  3:20 PM  Result Value Ref Range   Hepatitis B Surface Ag NEGATIVE NEGATIVE   Hep B C IgM NON REACTIVE NON REACTIVE    Comment: High levels of Hepatitis B Core IgM antibody are detectable during the acute stage of Hepatitis B. This antibody is used to differentiate current from past HBV infection.      Hep B S Ab NEG NEGATIVE   Hep A IgM NON REACTIVE NON REACTIVE   HCV Ab NEGATIVE NEGATIVE  Rocky mtn spotted fvr abs pnl(IgG+IgM)     Status: None   Collection Time: 07/09/16  3:20 PM  Result Value Ref Range   RMSF IgG NOT DETECTED NOT DETECTED   RMSF IgM NOT DETECTED NOT DETECTED    Comment: Cross-reactivity between the Spotted Fever and Typhus groups is minor. Significant cross reactivity within the Spotted Fever or Typhus group precludes speciation of the rickettsiae.   CBC with Differential/Platelet     Status: Abnormal   Collection Time: 07/09/16  3:20 PM  Result Value Ref Range   WBC 13.4 (H) 4.0 - 10.5 K/uL   RBC 4.30 4.22 - 5.81 Mil/uL   Hemoglobin 11.7 (L) 13.0 - 17.0 g/dL   HCT 35.7 (L) 39.0 - 52.0 %   MCV 83.0 78.0 - 100.0 fl   MCHC 32.7 30.0 - 36.0 g/dL   RDW 15.0 11.5 - 15.5 %   Platelets 115.0 (L) 150.0 - 400.0 K/uL   Neutrophils Relative % 85.6 (H) 43.0 - 77.0 %    Comment: manual diff=76seg,6band,14lymph,43moo   Lymphocytes Relative 10.0 (L) 12.0 - 46.0 %   Monocytes Relative 4.0 3.0 - 12.0 %   Eosinophils Relative 0.3 0.0 - 5.0 %   Basophils Relative 0.1 0.0 - 3.0 %   Neutro Abs 11.5 (H) 1.4 - 7.7 K/uL   Lymphs Abs 1.3 0.7 - 4.0 K/uL   Monocytes Absolute 0.5 0.1 - 1.0 K/uL   Eosinophils Absolute 0.0 0.0 - 0.7 K/uL   Basophils Absolute 0.0 0.0 - 0.1 K/uL  Hepatic function panel     Status: Abnormal   Collection Time: 07/09/16  3:20 PM  Result Value Ref Range   Total Bilirubin 1.3 (H) 0.2 - 1.2 mg/dL   Bilirubin, Direct 0.8 (H) 0.0 - 0.3 mg/dL   Alkaline Phosphatase 247 (H) 39 - 117 U/L   AST 77 (H) 0 - 37 U/L   ALT 86 (H) 0 - 53  U/L   Total Protein 5.5 (L) 6.0 - 8.3  g/dL   Albumin 2.9 (L) 3.5 - 5.2 g/dL  Antinuclear Antib (ANA)     Status: None   Collection Time: 07/09/16  3:20 PM  Result Value Ref Range   Anit Nuclear Antibody(ANA) NEG NEGATIVE    Comment: Although the specimen was negative for antinuclear antibodies (ANA), the presence of cytoplasmic fluorescence was noted on the HEp-2 slide. Other reactivities (e.g., anti-mitochondrial antibodies or anti-smooth muscle antibodies) may be responsible for this fluorescence.   Lyme Ab/Western Blot Reflex     Status: None   Collection Time: 07/09/16  3:20 PM  Result Value Ref Range   B burgdorferi Ab IgG+IgM <0.90 <0.90 Index    Comment:                      Index                Interpretation                    -----                --------------                    < 0.90               NEGATIVE                    0.90-1.09            EQUIVOCAL                    > 1.09               POSITIVE   As recommended by the Food and Drug Administration (FDA), all samples with positive or equivocal results on the B. burgdorferi antibody screen will be tested by Western Blot/Immunoblot. Positive or equivocal screening test results should not be interpreted as truly positive until verified as such using a supplemental assay (e.g., B. burgdorferi blot).   The screening test and/or blot for B. burgdorferi antibodies may be falsely negative in early stages of Lyme disease, including the period when erythema migrans is apparent.     Comprehensive metabolic panel     Status: Abnormal   Collection Time: 07/10/16  5:35 PM  Result Value Ref Range   Sodium 140 135 - 145 mmol/L   Potassium 3.7 3.5 - 5.1 mmol/L   Chloride 107 101 - 111 mmol/L   CO2 24 22 - 32 mmol/L   Glucose, Bld 120 (H) 65 - 99 mg/dL   BUN 35 (H) 6 - 20 mg/dL   Creatinine, Ser 0.95 0.61 - 1.24 mg/dL   Calcium 7.8 (L) 8.9 - 10.3 mg/dL   Total Protein 5.3 (L) 6.5 - 8.1 g/dL   Albumin 2.1 (L) 3.5 - 5.0  g/dL   AST 47 (H) 15 - 41 U/L   ALT 56 17 - 63 U/L   Alkaline Phosphatase 310 (H) 38 - 126 U/L   Total Bilirubin 1.9 (H) 0.3 - 1.2 mg/dL   GFR calc non Af Amer >60 >60 mL/min   GFR calc Af Amer >60 >60 mL/min    Comment: (NOTE) The eGFR has been calculated using the CKD EPI equation. This calculation has not been validated in all clinical situations. eGFR's persistently <60 mL/min signify possible Chronic Kidney Disease.    Anion gap 9 5 - 15  CBC WITH DIFFERENTIAL  Status: Abnormal   Collection Time: 07/10/16  5:35 PM  Result Value Ref Range   WBC 10.0 4.0 - 10.5 K/uL   RBC 3.77 (L) 4.22 - 5.81 MIL/uL   Hemoglobin 10.3 (L) 13.0 - 17.0 g/dL   HCT 30.9 (L) 39.0 - 52.0 %   MCV 82.0 78.0 - 100.0 fL   MCH 27.3 26.0 - 34.0 pg   MCHC 33.3 30.0 - 36.0 g/dL   RDW 14.7 11.5 - 15.5 %   Platelets 118 (L) 150 - 400 K/uL    Comment: SPECIMEN CHECKED FOR CLOTS REPEATED TO VERIFY PLATELET COUNT CONFIRMED BY SMEAR    Neutrophils Relative % 85 %   Lymphocytes Relative 8 %   Monocytes Relative 7 %   Eosinophils Relative 0 %   Basophils Relative 0 %   Neutro Abs 8.5 (H) 1.7 - 7.7 K/uL   Lymphs Abs 0.8 0.7 - 4.0 K/uL   Monocytes Absolute 0.7 0.1 - 1.0 K/uL   Eosinophils Absolute 0.0 0.0 - 0.7 K/uL   Basophils Absolute 0.0 0.0 - 0.1 K/uL   Smear Review MORPHOLOGY UNREMARKABLE   Blood Culture (routine x 2)     Status: Abnormal   Collection Time: 07/10/16  5:35 PM  Result Value Ref Range   Specimen Description BLOOD LEFT ARM    Special Requests      BOTTLES DRAWN AEROBIC AND ANAEROBIC Blood Culture adequate volume   Culture  Setup Time      GRAM POSITIVE COCCI IN CHAINS ANAEROBIC BOTTLE ONLY Organism ID to follow CRITICAL RESULT CALLED TO, READ BACK BY AND VERIFIED WITH: J. Scherrie November Pharm.D. 11:15 07/12/16 ([email or web address])    Culture (A)     STREPTOCOCCUS INTERMEDIUS SUSCEPTIBILITIES PERFORMED ON PREVIOUS CULTURE WITHIN THE LAST 5 DAYS. Performed at [facility], [place] [address]  [place], [place] [address]    Report Status 07/15/2016 FINAL   Urinalysis, Routine w reflex microscopic     Status: Abnormal   Collection Time: 07/10/16  5:35 PM  Result Value Ref Range   Color, Urine YELLOW YELLOW   APPearance CLEAR CLEAR   Specific Gravity, Urine 1.038 (H) 1.005 - 1.030   pH 6.0 5.0 - 8.0   Glucose, UA NEGATIVE NEGATIVE mg/dL   Hgb urine dipstick NEGATIVE NEGATIVE   Bilirubin Urine NEGATIVE NEGATIVE   Ketones, ur NEGATIVE NEGATIVE mg/dL   Protein, ur NEGATIVE NEGATIVE mg/dL   Nitrite NEGATIVE NEGATIVE   Leukocytes, UA NEGATIVE NEGATIVE  Blood Culture ID Panel (Reflexed)     Status: Abnormal   Collection Time: 07/10/16  5:35 PM  Result Value Ref Range   Enterococcus species NOT DETECTED NOT DETECTED   Listeria monocytogenes NOT DETECTED NOT DETECTED   Staphylococcus species NOT DETECTED NOT DETECTED   Staphylococcus aureus NOT DETECTED NOT DETECTED   Streptococcus species DETECTED (A) NOT DETECTED    Comment: Not Enterococcus species, Streptococcus agalactiae, Streptococcus pyogenes, or Streptococcus pneumoniae. CRITICAL RESULT CALLED TO, READ BACK BY AND VERIFIED WITH: J. Scherrie November Pharm.D. 11:15 07/12/16 ([email or web address])    Streptococcus agalactiae NOT DETECTED NOT DETECTED   Streptococcus pneumoniae NOT DETECTED NOT DETECTED   Streptococcus pyogenes NOT DETECTED NOT DETECTED   Acinetobacter baumannii NOT DETECTED NOT DETECTED   Enterobacteriaceae species NOT DETECTED NOT DETECTED   Enterobacter cloacae complex NOT DETECTED NOT DETECTED   Escherichia coli NOT DETECTED NOT DETECTED   Klebsiella oxytoca NOT DETECTED NOT DETECTED   Klebsiella pneumoniae NOT DETECTED NOT DETECTED   Proteus species NOT DETECTED NOT DETECTED  Serratia marcescens NOT DETECTED NOT DETECTED   Haemophilus influenzae NOT DETECTED NOT DETECTED   Neisseria meningitidis NOT DETECTED NOT DETECTED   Pseudomonas aeruginosa NOT DETECTED NOT DETECTED   Candida albicans NOT DETECTED NOT DETECTED   Candida  glabrata NOT DETECTED NOT DETECTED   Candida krusei NOT DETECTED NOT DETECTED   Candida parapsilosis NOT DETECTED NOT DETECTED   Candida tropicalis NOT DETECTED NOT DETECTED    Comment: Performed at [facility] [address] [place], [place] [address]  Blood Culture (routine x 2)     Status: Abnormal   Collection Time: 07/10/16  5:43 PM  Result Value Ref Range   Specimen Description BLOOD RIGHT HAND    Special Requests      BOTTLES DRAWN AEROBIC AND ANAEROBIC Blood Culture adequate volume   Culture  Setup Time      GRAM POSITIVE COCCI IN CHAINS ANAEROBIC BOTTLE ONLY CRITICAL VALUE NOTED.  VALUE IS CONSISTENT WITH PREVIOUSLY REPORTED AND CALLED VALUE. Performed at [facility], [place] [address] [place], [place] [address]    Culture STREPTOCOCCUS INTERMEDIUS (A)    Report Status 07/15/2016 FINAL    Organism ID, Bacteria STREPTOCOCCUS INTERMEDIUS       Susceptibility   Streptococcus intermedius - MIC*    PENICILLIN <=0.06 SENSITIVE Sensitive     CEFTRIAXONE 0.25 SENSITIVE Sensitive     ERYTHROMYCIN >=8 RESISTANT Resistant     LEVOFLOXACIN <=0.25 SENSITIVE Sensitive     VANCOMYCIN 0.5 SENSITIVE Sensitive     * STREPTOCOCCUS INTERMEDIUS  I-Stat CG4 Lactic Acid, ED  (not at  [facility])     Status: None   Collection Time: 07/10/16  5:46 PM  Result Value Ref Range   Lactic Acid, Venous 1.31 0.5 - 1.9 mmol/L  Vitamin B12     Status: Abnormal   Collection Time: 07/10/16 10:55 PM  Result Value Ref Range   Vitamin B-12 4,419 (H) 180 - 914 pg/mL    Comment: (NOTE) This assay is not validated for testing neonatal or myeloproliferative syndrome specimens for Vitamin B12 levels. Performed at [facility], [place] [address] [place], [place] [address]   Folate     Status: None   Collection Time: 07/10/16 10:55 PM  Result Value Ref Range   Folate 7.4 >5.9 ng/mL    Comment: Performed at [facility], [place] [address], [place], [place] [address]  Iron and TIBC     Status:  Abnormal   Collection Time: 07/10/16 10:55 PM  Result Value Ref Range   Iron 6 (L) 45 - 182 ug/dL   TIBC 134 (L) 250 - 450 ug/dL   Saturation Ratios 4 (L) 17.9 - 39.5 %   UIBC 128 ug/dL    Comment: Performed at [facility] [address] [place], [place] [address]  Ferritin     Status: Abnormal   Collection Time: 07/10/16 10:55 PM  Result Value Ref Range   Ferritin 499 (H) 24 - 336 ng/mL    Comment: Performed at [facility], [place] [address], [place], [place] [address]  Reticulocytes     Status: Abnormal   Collection Time: 07/10/16 10:55 PM  Result Value Ref Range   Retic Ct Pct 0.5 0.4 - 3.1 %   RBC. 3.82 (L) 4.22 - 5.81 MIL/uL   Retic Count, Absolute 19.1 19.0 - 186.0 K/uL  E. histolytica antibody (amoeba ab)     Status: None   Collection Time: 07/10/16 10:55  PM  Result Value Ref Range   E histolytica Ab Negative Negative    Comment: (NOTE) Performed At: [facility] [place], [place] [id number] Lindon Romp MD OQ:[id number]   Strep pneumoniae urinary antigen     Status: None   Collection Time: 07/10/16 11:47 PM  Result Value Ref Range   Strep Pneumo Urinary Antigen NEGATIVE NEGATIVE    Comment:        Infection due to S. pneumoniae cannot be absolutely ruled out since the antigen present may be below the detection limit of the test. Performed at [facility], [address], [place], [place] [address]   CBC with Differential/Platelet     Status: Abnormal   Collection Time: 07/11/16  5:17 AM  Result Value Ref Range   WBC 11.5 (H) 4.0 - 10.5 K/uL   RBC 3.69 (L) 4.22 - 5.81 MIL/uL   Hemoglobin 10.1 (L) 13.0 - 17.0 g/dL   HCT 30.6 (L) 39.0 - 52.0 %   MCV 82.9 78.0 - 100.0 fL   MCH 27.4 26.0 - 34.0 pg   MCHC 33.0 30.0 - 36.0 g/dL   RDW 14.9 11.5 - 15.5 %   Platelets 95 (L) 150 - 400 K/uL    Comment: CONSISTENT WITH PREVIOUS RESULT   Neutrophils Relative % 86 %   Neutro Abs 9.9 (H) 1.7 - 7.7 K/uL   Lymphocytes Relative  7 %   Lymphs Abs 0.8 0.7 - 4.0 K/uL   Monocytes Relative 7 %   Monocytes Absolute 0.7 0.1 - 1.0 K/uL   Eosinophils Relative 0 %   Eosinophils Absolute 0.0 0.0 - 0.7 K/uL   Basophils Relative 0 %   Basophils Absolute 0.0 0.0 - 0.1 K/uL  Protime-INR     Status: Abnormal   Collection Time: 07/11/16  5:17 AM  Result Value Ref Range   Prothrombin Time 17.3 (H) 11.4 - 15.2 seconds   INR 1.41   Comprehensive metabolic panel     Status: Abnormal   Collection Time: 07/11/16  5:17 AM  Result Value Ref Range   Sodium 141 135 - 145 mmol/L   Potassium 3.6 3.5 - 5.1 mmol/L   Chloride 111 101 - 111 mmol/L   CO2 24 22 - 32 mmol/L   Glucose, Bld 91 65 - 99 mg/dL   BUN 27 (H) 6 - 20 mg/dL   Creatinine, Ser 0.79 0.61 - 1.24 mg/dL   Calcium 7.4 (L) 8.9 - 10.3 mg/dL   Total Protein 5.0 (L) 6.5 - 8.1 g/dL   Albumin 1.8 (L) 3.5 - 5.0 g/dL   AST 49 (H) 15 - 41 U/L   ALT 52 17 - 63 U/L   Alkaline Phosphatase 286 (H) 38 - 126 U/L   Total Bilirubin 2.1 (H) 0.3 - 1.2 mg/dL   GFR calc non Af Amer >60 >60 mL/min   GFR calc Af Amer >60 >60 mL/min    Comment: (NOTE) The eGFR has been calculated using the CKD EPI equation. This calculation has not been validated in all clinical situations. eGFR's persistently <60 mL/min signify possible Chronic Kidney Disease.    Anion gap 6 5 - 15  Aerobic/Anaerobic Culture (surgical/deep wound)     Status: None   Collection Time: 07/11/16  5:22 PM  Result Value Ref Range   Specimen Description ABSCESS HEPATIC    Special Requests NONE    Gram Stain      ABUNDANT WBC PRESENT, PREDOMINANTLY PMN ABUNDANT GRAM POSITIVE COCCI  IN PAIRS    Culture      ABUNDANT STREPTOCOCCUS INTERMEDIUS NO ANAEROBES ISOLATED Performed at [facility], [place] [address], [place], [place] [address]    Report Status 07/16/2016 FINAL    Organism ID, Bacteria STREPTOCOCCUS INTERMEDIUS       Susceptibility   Streptococcus intermedius - MIC*    PENICILLIN <=0.06 SENSITIVE Sensitive      CEFTRIAXONE <=0.12 SENSITIVE Sensitive     ERYTHROMYCIN >=8 RESISTANT Resistant     LEVOFLOXACIN <=0.25 SENSITIVE Sensitive     VANCOMYCIN 0.25 SENSITIVE Sensitive     * ABUNDANT STREPTOCOCCUS INTERMEDIUS  MRSA PCR Screening     Status: None   Collection Time: 07/11/16  6:54 PM  Result Value Ref Range   MRSA by PCR NEGATIVE NEGATIVE    Comment:        The GeneXpert MRSA Assay (FDA approved for NASAL specimens only), is one component of a comprehensive MRSA colonization surveillance program. It is not intended to diagnose MRSA infection nor to guide or monitor treatment for MRSA infections.   Comprehensive metabolic panel     Status: Abnormal   Collection Time: 07/12/16  8:59 AM  Result Value Ref Range   Sodium 142 135 - 145 mmol/L   Potassium 3.3 (L) 3.5 - 5.1 mmol/L   Chloride 111 101 - 111 mmol/L   CO2 26 22 - 32 mmol/L   Glucose, Bld 110 (H) 65 - 99 mg/dL   BUN 29 (H) 6 - 20 mg/dL   Creatinine, Ser 0.89 0.61 - 1.24 mg/dL   Calcium 7.9 (L) 8.9 - 10.3 mg/dL   Total Protein 5.2 (L) 6.5 - 8.1 g/dL   Albumin 1.9 (L) 3.5 - 5.0 g/dL   AST 41 15 - 41 U/L   ALT 45 17 - 63 U/L   Alkaline Phosphatase 309 (H) 38 - 126 U/L   Total Bilirubin 1.8 (H) 0.3 - 1.2 mg/dL   GFR calc non Af Amer >60 >60 mL/min   GFR calc Af Amer >60 >60 mL/min    Comment: (NOTE) The eGFR has been calculated using the CKD EPI equation. This calculation has not been validated in all clinical situations. eGFR's persistently <60 mL/min signify possible Chronic Kidney Disease.    Anion gap 5 5 - 15  CBC     Status: Abnormal   Collection Time: 07/12/16  8:59 AM  Result Value Ref Range   WBC 11.8 (H) 4.0 - 10.5 K/uL   RBC 3.61 (L) 4.22 - 5.81 MIL/uL   Hemoglobin 9.9 (L) 13.0 - 17.0 g/dL   HCT 30.3 (L) 39.0 - 52.0 %   MCV 83.9 78.0 - 100.0 fL   MCH 27.4 26.0 - 34.0 pg   MCHC 32.7 30.0 - 36.0 g/dL   RDW 15.2 11.5 - 15.5 %   Platelets 114 (L) 150 - 400 K/uL    Comment: CONSISTENT WITH PREVIOUS RESULT   Ammonia     Status: Abnormal   Collection Time: 07/12/16  2:57 PM  Result Value Ref Range   Ammonia 39 (H) 9 - 35 umol/L  Comprehensive metabolic panel     Status: Abnormal   Collection Time: 07/13/16  3:20 AM  Result Value Ref Range   Sodium 140 135 - 145 mmol/L   Potassium 3.5 3.5 - 5.1 mmol/L   Chloride 111 101 - 111 mmol/L   CO2 23 22 - 32 mmol/L   Glucose, Bld 113 (H) 65 - 99 mg/dL   BUN  21 (H) 6 - 20 mg/dL   Creatinine, Ser 0.75 0.61 - 1.24 mg/dL   Calcium 7.5 (L) 8.9 - 10.3 mg/dL   Total Protein 4.6 (L) 6.5 - 8.1 g/dL   Albumin 1.7 (L) 3.5 - 5.0 g/dL   AST 32 15 - 41 U/L   ALT 35 17 - 63 U/L   Alkaline Phosphatase 404 (H) 38 - 126 U/L   Total Bilirubin 1.3 (H) 0.3 - 1.2 mg/dL   GFR calc non Af Amer >60 >60 mL/min   GFR calc Af Amer >60 >60 mL/min    Comment: (NOTE) The eGFR has been calculated using the CKD EPI equation. This calculation has not been validated in all clinical situations. eGFR's persistently <60 mL/min signify possible Chronic Kidney Disease.    Anion gap 6 5 - 15  CBC     Status: Abnormal   Collection Time: 07/13/16  3:20 AM  Result Value Ref Range   WBC 10.5 4.0 - 10.5 K/uL   RBC 3.47 (L) 4.22 - 5.81 MIL/uL   Hemoglobin 9.5 (L) 13.0 - 17.0 g/dL   HCT 28.7 (L) 39.0 - 52.0 %   MCV 82.7 78.0 - 100.0 fL   MCH 27.4 26.0 - 34.0 pg   MCHC 33.1 30.0 - 36.0 g/dL   RDW 15.3 11.5 - 15.5 %   Platelets 127 (L) 150 - 400 K/uL  Comprehensive metabolic panel     Status: Abnormal   Collection Time: 07/14/16  8:02 AM  Result Value Ref Range   Sodium 140 135 - 145 mmol/L   Potassium 3.5 3.5 - 5.1 mmol/L   Chloride 108 101 - 111 mmol/L   CO2 24 22 - 32 mmol/L   Glucose, Bld 96 65 - 99 mg/dL   BUN 16 6 - 20 mg/dL   Creatinine, Ser 0.58 (L) 0.61 - 1.24 mg/dL   Calcium 7.6 (L) 8.9 - 10.3 mg/dL   Total Protein 4.7 (L) 6.5 - 8.1 g/dL   Albumin 1.7 (L) 3.5 - 5.0 g/dL   AST 23 15 - 41 U/L   ALT 26 17 - 63 U/L   Alkaline Phosphatase 435 (H) 38 - 126 U/L    Total Bilirubin 1.5 (H) 0.3 - 1.2 mg/dL   GFR calc non Af Amer >60 >60 mL/min   GFR calc Af Amer >60 >60 mL/min    Comment: (NOTE) The eGFR has been calculated using the CKD EPI equation. This calculation has not been validated in all clinical situations. eGFR's persistently <60 mL/min signify possible Chronic Kidney Disease.    Anion gap 8 5 - 15  Comprehensive metabolic panel     Status: Abnormal   Collection Time: 07/15/16  6:15 AM  Result Value Ref Range   Sodium 140 135 - 145 mmol/L   Potassium 3.6 3.5 - 5.1 mmol/L   Chloride 105 101 - 111 mmol/L   CO2 26 22 - 32 mmol/L   Glucose, Bld 101 (H) 65 - 99 mg/dL   BUN 16 6 - 20 mg/dL   Creatinine, Ser 0.65 0.61 - 1.24 mg/dL   Calcium 7.7 (L) 8.9 - 10.3 mg/dL   Total Protein 4.8 (L) 6.5 - 8.1 g/dL   Albumin 1.7 (L) 3.5 - 5.0 g/dL   AST 18 15 - 41 U/L   ALT 20 17 - 63 U/L   Alkaline Phosphatase 427 (H) 38 - 126 U/L   Total Bilirubin 1.2 0.3 - 1.2 mg/dL   GFR calc non Af Amer >60 >  60 mL/min   GFR calc Af Amer >60 >60 mL/min    Comment: (NOTE) The eGFR has been calculated using the CKD EPI equation. This calculation has not been validated in all clinical situations. eGFR's persistently <60 mL/min signify possible Chronic Kidney Disease.    Anion gap 9 5 - 15  CBC     Status: Abnormal   Collection Time: 07/18/16 12:46 PM  Result Value Ref Range   WBC 6.7 4.0 - 10.5 K/uL   RBC 3.45 (L) 4.22 - 5.81 Mil/uL   Platelets 233.0 150.0 - 400.0 K/uL   Hemoglobin 9.4 (L) 13.0 - 17.0 g/dL   HCT 29.4 (L) 39.0 - 52.0 %   MCV 85.1 78.0 - 100.0 fl   MCHC 32.1 30.0 - 36.0 g/dL   RDW 16.1 (H) 11.5 - 15.5 %  Comp Met (CMET)     Status: Abnormal   Collection Time: 07/18/16 12:46 PM  Result Value Ref Range   Sodium 139 135 - 145 mEq/L   Potassium 4.0 3.5 - 5.1 mEq/L   Chloride 104 96 - 112 mEq/L   CO2 31 19 - 32 mEq/L   Glucose, Bld 115 (H) 70 - 99 mg/dL   BUN 9 6 - 23 mg/dL   Creatinine, Ser 0.51 0.40 - 1.50 mg/dL   Total Bilirubin 0.8  0.2 - 1.2 mg/dL   Alkaline Phosphatase 425 (H) 39 - 117 U/L   AST 19 0 - 37 U/L   ALT 11 0 - 53 U/L   Total Protein 5.0 (L) 6.0 - 8.3 g/dL   Albumin 2.1 (L) 3.5 - 5.2 g/dL   Calcium 7.6 (L) 8.4 - 10.5 mg/dL   GFR 168.96 >60.00 mL/min  PH, Body Fluid     Status: None   Collection Time: 07/19/16  4:14 PM  Result Value Ref Range   pH, Body Fluid 8.0 Not Estab.    Comment: (NOTE) This test was developed and its performance characteristics determined by [facility]. It has not been cleared or approved by the Food and Drug Administration. Performed At: [facility] [place], [place] [id number] Lindon Romp MD [id number]    Source of Sample PLEU RIGHT   Protein, pleural or peritoneal fluid     Status: None   Collection Time: 07/19/16  4:14 PM  Result Value Ref Range   Total protein, fluid <3.0 g/dL    Comment: (NOTE) No normal range established for this test Results should be evaluated in conjunction with serum values    Fluid Type-FTP PLEU RIGHT   Body fluid cell count with differential     Status: Abnormal   Collection Time: 07/19/16  4:24 PM  Result Value Ref Range   Fluid Type-FCT FLUID     Comment: RIGHT PLEURAL CORRECTED ON 06/14 AT 1629: PREVIOUSLY REPORTED AS Pleural R    Color, Fluid ORANGE (A) YELLOW   Appearance, Fluid CLOUDY (A) CLEAR   WBC, Fluid 10,250 (H) 0 - 1,000 cu mm   Neutrophil Count, Fluid 82 (H) 0 - 25 %   Lymphs, Fluid 4 %   Monocyte-Macrophage-Serous Fluid 14 (L) 50 - 90 %   Eos, Fluid 0 %   Other Cells, Fluid 0 %  Culture, body fluid-bottle     Status: None   Collection Time: 07/19/16  4:24 PM  Result Value Ref Range   Specimen Description PLEURAL    Special Requests RIGHT PLEURAL    Culture NO GROWTH 5 DAYS    Report  Status 07/24/2016 FINAL   Gram stain     Status: None   Collection Time: 07/19/16  4:24 PM  Result Value Ref Range   Specimen Description PLEURAL    Special Requests RIGHT PLEURAL    Gram Stain       ABUNDANT WBC PRESENT, PREDOMINANTLY PMN NO ORGANISMS SEEN    Report Status 07/19/2016 FINAL   Pathologist smear review     Status: None   Collection Time: 07/19/16  4:24 PM  Result Value Ref Range   Path Review Mixed inflammatory cells, predominantly     Comment: neutrophils. Reviewed by Lennox Solders Lyndon Code, M.D. 07/20/2016   Lactate dehydrogenase, body fluid     Status: Abnormal   Collection Time: 07/19/16  4:24 PM  Result Value Ref Range   LD, Fluid 149 (H) 3 - 23 U/L    Comment: (NOTE) Results should be evaluated in conjunction with serum values    Fluid Type-FLDH PLEU RIGHT     Comment: CORRECTED ON 06/15 AT 1300: PREVIOUSLY REPORTED AS Pleural R  Glucose, pleural or peritoneal fluid     Status: None   Collection Time: 07/19/16  4:24 PM  Result Value Ref Range   Glucose, Fluid 113 mg/dL    Comment: (NOTE) No normal range established for this test Results should be evaluated in conjunction with serum values    Fluid Type-FGLU PLEU RIGHT     Comment: CORRECTED ON 06/15 AT 1300: PREVIOUSLY REPORTED AS Pleural R  CBC with Differential/Platelet     Status: Abnormal   Collection Time: 07/19/16  6:18 PM  Result Value Ref Range   WBC 6.4 4.0 - 10.5 K/uL   RBC 3.38 (L) 4.22 - 5.81 MIL/uL   Hemoglobin 9.1 (L) 13.0 - 17.0 g/dL   HCT 28.9 (L) 39.0 - 52.0 %   MCV 85.5 78.0 - 100.0 fL   MCH 26.9 26.0 - 34.0 pg   MCHC 31.5 30.0 - 36.0 g/dL   RDW 16.4 (H) 11.5 - 15.5 %   Platelets 252 150 - 400 K/uL   Neutrophils Relative % 71 %   Neutro Abs 4.6 1.7 - 7.7 K/uL   Lymphocytes Relative 23 %   Lymphs Abs 1.5 0.7 - 4.0 K/uL   Monocytes Relative 4 %   Monocytes Absolute 0.3 0.1 - 1.0 K/uL   Eosinophils Relative 1 %   Eosinophils Absolute 0.0 0.0 - 0.7 K/uL   Basophils Relative 1 %   Basophils Absolute 0.0 0.0 - 0.1 K/uL  Comprehensive metabolic panel     Status: Abnormal   Collection Time: 07/19/16  6:18 PM  Result Value Ref Range   Sodium 136 135 - 145 mmol/L   Potassium 3.5 3.5 -  5.1 mmol/L   Chloride 102 101 - 111 mmol/L   CO2 26 22 - 32 mmol/L   Glucose, Bld 107 (H) 65 - 99 mg/dL   BUN 5 (L) 6 - 20 mg/dL   Creatinine, Ser 0.54 (L) 0.61 - 1.24 mg/dL   Calcium 7.5 (L) 8.9 - 10.3 mg/dL   Total Protein 5.6 (L) 6.5 - 8.1 g/dL   Albumin 1.8 (L) 3.5 - 5.0 g/dL   AST 24 15 - 41 U/L   ALT 15 (L) 17 - 63 U/L   Alkaline Phosphatase 359 (H) 38 - 126 U/L   Total Bilirubin 0.9 0.3 - 1.2 mg/dL   GFR calc non Af Amer >60 >60 mL/min   GFR calc Af Amer >60 >60 mL/min  Comment: (NOTE) The eGFR has been calculated using the CKD EPI equation. This calculation has not been validated in all clinical situations. eGFR's persistently <60 mL/min signify possible Chronic Kidney Disease.    Anion gap 8 5 - 15  Protime-INR     Status: Abnormal   Collection Time: 07/19/16  6:18 PM  Result Value Ref Range   Prothrombin Time 16.1 (H) 11.4 - 15.2 seconds   INR 1.29   Culture, blood (routine x 2)     Status: None   Collection Time: 07/19/16  6:18 PM  Result Value Ref Range   Specimen Description BLOOD LEFT ANTECUBITAL    Special Requests      BOTTLES DRAWN AEROBIC AND ANAEROBIC Blood Culture adequate volume   Culture NO GROWTH 5 DAYS    Report Status 07/24/2016 FINAL   Culture, blood (routine x 2)     Status: None   Collection Time: 07/19/16  6:18 PM  Result Value Ref Range   Specimen Description BLOOD RIGHT HAND    Special Requests      BOTTLES DRAWN AEROBIC AND ANAEROBIC Blood Culture adequate volume   Culture NO GROWTH 5 DAYS    Report Status 07/24/2016 FINAL   Basic metabolic panel     Status: Abnormal   Collection Time: 07/20/16  3:10 AM  Result Value Ref Range   Sodium 134 (L) 135 - 145 mmol/L   Potassium 3.5 3.5 - 5.1 mmol/L   Chloride 101 101 - 111 mmol/L   CO2 26 22 - 32 mmol/L   Glucose, Bld 100 (H) 65 - 99 mg/dL   BUN <5 (L) 6 - 20 mg/dL   Creatinine, Ser 0.61 0.61 - 1.24 mg/dL   Calcium 7.5 (L) 8.9 - 10.3 mg/dL   GFR calc non Af Amer >60 >60 mL/min   GFR  calc Af Amer >60 >60 mL/min    Comment: (NOTE) The eGFR has been calculated using the CKD EPI equation. This calculation has not been validated in all clinical situations. eGFR's persistently <60 mL/min signify possible Chronic Kidney Disease.    Anion gap 7 5 - 15  CBC     Status: Abnormal   Collection Time: 07/20/16  3:10 AM  Result Value Ref Range   WBC 6.0 4.0 - 10.5 K/uL   RBC 3.01 (L) 4.22 - 5.81 MIL/uL   Hemoglobin 8.1 (L) 13.0 - 17.0 g/dL   HCT 25.9 (L) 39.0 - 52.0 %   MCV 86.0 78.0 - 100.0 fL   MCH 26.9 26.0 - 34.0 pg   MCHC 31.3 30.0 - 36.0 g/dL   RDW 16.7 (H) 11.5 - 15.5 %   Platelets 227 150 - 400 K/uL  Vitamin B12     Status: Abnormal   Collection Time: 07/20/16  8:42 AM  Result Value Ref Range   Vitamin B-12 3,488 (H) 180 - 914 pg/mL    Comment: (NOTE) This assay is not validated for testing neonatal or myeloproliferative syndrome specimens for Vitamin B12 levels.   Folate     Status: None   Collection Time: 07/20/16  8:42 AM  Result Value Ref Range   Folate 9.0 >5.9 ng/mL  Iron and TIBC     Status: Abnormal   Collection Time: 07/20/16  8:42 AM  Result Value Ref Range   Iron 10 (L) 45 - 182 ug/dL   TIBC 143 (L) 250 - 450 ug/dL   Saturation Ratios 7 (L) 17.9 - 39.5 %   UIBC 133 ug/dL  Ferritin  Status: None   Collection Time: 07/20/16  8:42 AM  Result Value Ref Range   Ferritin 307 24 - 336 ng/mL  Lactate dehydrogenase     Status: None   Collection Time: 07/20/16 12:27 PM  Result Value Ref Range   LDH 161 98 - 192 U/L  CBC with Differential/Platelet     Status: Abnormal   Collection Time: 07/21/16  3:30 AM  Result Value Ref Range   WBC 7.4 4.0 - 10.5 K/uL   RBC 2.99 (L) 4.22 - 5.81 MIL/uL   Hemoglobin 8.1 (L) 13.0 - 17.0 g/dL   HCT 25.7 (L) 39.0 - 52.0 %   MCV 86.0 78.0 - 100.0 fL   MCH 27.1 26.0 - 34.0 pg   MCHC 31.5 30.0 - 36.0 g/dL   RDW 16.7 (H) 11.5 - 15.5 %   Platelets 233 150 - 400 K/uL   Neutrophils Relative % 76 %   Neutro Abs 5.6  1.7 - 7.7 K/uL   Lymphocytes Relative 16 %   Lymphs Abs 1.2 0.7 - 4.0 K/uL   Monocytes Relative 7 %   Monocytes Absolute 0.5 0.1 - 1.0 K/uL   Eosinophils Relative 1 %   Eosinophils Absolute 0.0 0.0 - 0.7 K/uL   Basophils Relative 0 %   Basophils Absolute 0.0 0.0 - 0.1 K/uL  Comprehensive metabolic panel     Status: Abnormal   Collection Time: 07/21/16  3:30 AM  Result Value Ref Range   Sodium 135 135 - 145 mmol/L   Potassium 4.1 3.5 - 5.1 mmol/L   Chloride 101 101 - 111 mmol/L   CO2 28 22 - 32 mmol/L   Glucose, Bld 101 (H) 65 - 99 mg/dL   BUN 5 (L) 6 - 20 mg/dL   Creatinine, Ser 0.73 0.61 - 1.24 mg/dL   Calcium 7.6 (L) 8.9 - 10.3 mg/dL   Total Protein 5.2 (L) 6.5 - 8.1 g/dL   Albumin 1.6 (L) 3.5 - 5.0 g/dL   AST 28 15 - 41 U/L   ALT 14 (L) 17 - 63 U/L   Alkaline Phosphatase 308 (H) 38 - 126 U/L   Total Bilirubin 0.9 0.3 - 1.2 mg/dL   GFR calc non Af Amer >60 >60 mL/min   GFR calc Af Amer >60 >60 mL/min    Comment: (NOTE) The eGFR has been calculated using the CKD EPI equation. This calculation has not been validated in all clinical situations. eGFR's persistently <60 mL/min signify possible Chronic Kidney Disease.    Anion gap 6 5 - 15  Comp Met (CMET)     Status: Abnormal   Collection Time: 07/27/16 10:25 AM  Result Value Ref Range   Sodium 138 135 - 145 mEq/L   Potassium 4.2 3.5 - 5.1 mEq/L   Chloride 101 96 - 112 mEq/L   CO2 29 19 - 32 mEq/L   Glucose, Bld 113 (H) 70 - 99 mg/dL   BUN 9 6 - 23 mg/dL   Creatinine, Ser 0.60 0.40 - 1.50 mg/dL   Total Bilirubin 0.5 0.2 - 1.2 mg/dL   Alkaline Phosphatase 259 (H) 39 - 117 U/L   AST 26 0 - 37 U/L   ALT 15 0 - 53 U/L   Total Protein 6.2 6.0 - 8.3 g/dL   Albumin 2.7 (L) 3.5 - 5.2 g/dL   Calcium 8.5 8.4 - 10.5 mg/dL   GFR 140.05 >60.00 mL/min  CBC     Status: Abnormal   Collection Time: 07/27/16 10:25 AM  Result Value Ref Range   WBC 5.1 4.0 - 10.5 K/uL   RBC 3.83 (L) 4.22 - 5.81 Mil/uL   Platelets 217.0 150.0 -  400.0 K/uL   Hemoglobin 10.5 (L) 13.0 - 17.0 g/dL   HCT 32.6 (L) 39.0 - 52.0 %   MCV 85.2 78.0 - 100.0 fl   MCHC 32.0 30.0 - 36.0 g/dL   RDW 17.2 (H) 11.5 - 15.5 %  POCT urinalysis dipstick     Status: Abnormal   Collection Time: 08/01/16  2:19 PM  Result Value Ref Range   Color, UA amber    Clarity, UA cloudy    Glucose, UA negative    Bilirubin, UA negative    Ketones, UA negative    Spec Grav, UA 1.015 1.010 - 1.025   Blood, UA negative    pH, UA 6.5 5.0 - 8.0   Protein, UA +-    Urobilinogen, UA 0.2 0.2 or 1.0 E.U./dL   Nitrite, UA negative    Leukocytes, UA Negative Negative  Urine Culture     Status: None   Collection Time: 08/01/16  2:31 PM  Result Value Ref Range   Organism ID, Bacteria NO GROWTH   ECHOCARDIOGRAM COMPLETE     Status: None   Collection Time: 08/03/16  3:54 PM  Result Value Ref Range   Weight 2,608 oz   Height 70.000 in   BP 149/87 mmHg   Assessment/Plan: 1. Hepatic abscess Repeat labs today to ensure continued improvement of LFTs. Pain medication refilled today for chronic pain. Avoiding tylenol-containing compounds for now. Follow-up with specialists as scheduled. - CBC - Comp Met (CMET) - oxyCODONE (OXY IR/ROXICODONE) 5 MG immediate release tablet; Take 1 tablet (5 mg total) by mouth every 6 (six) hours as needed for moderate pain.  Dispense: 40 tablet; Refill: 0   2. Normocytic anemia On iron supplement. Repeat labs today to assess further.    Leeanne Rio, PA-C

## 2016-08-09 NOTE — Progress Notes (Signed)
Pre visit review using our clinic review tool, if applicable. No additional management support is needed unless otherwise documented below in the visit note. 

## 2016-08-13 ENCOUNTER — Telehealth: Payer: Self-pay | Admitting: Gastroenterology

## 2016-08-13 DIAGNOSIS — R197 Diarrhea, unspecified: Secondary | ICD-10-CM

## 2016-08-13 MED ORDER — DICYCLOMINE HCL 10 MG PO CAPS: 10.0000 mg | ORAL_CAPSULE | Freq: Three times a day (TID) | ORAL | 0 refills | Status: DC | PRN

## 2016-08-13 NOTE — Telephone Encounter (Signed)
Left message for patient to call back  

## 2016-08-13 NOTE — Telephone Encounter (Signed)
Patient notified he will come pick up the container.

## 2016-08-13 NOTE — Telephone Encounter (Signed)
Patient reports that he has had abdominal cramping and incomplete evacuation.  He describes soft stools, multiple a day, not liquid.  But does not feel he is ever able to "clean out". Has terrible cramping and pains, "my wife told me it was like being in labor".  He does not have any bleeding.  He will come in and see Ellouise Newer, [place] on Wed.  He recently treated for a liver abscess with IV antibiotics for 16 days at home.  He is taking a generic probiotic.  He reports that the pain is worse after he has a meal.  In the past he reports you prescribed an antispasmodic is it possible to get a rx until OV with Ellouise Newer, [place] on  Wed

## 2016-08-13 NOTE — Telephone Encounter (Signed)
Dicyclomine 10 mg po tid as needed, #90, no refills.  GI pathogen panel.

## 2016-08-15 ENCOUNTER — Encounter: Payer: Self-pay | Admitting: Physician Assistant

## 2016-08-15 ENCOUNTER — Other Ambulatory Visit: Payer: Medicare Other

## 2016-08-15 ENCOUNTER — Ambulatory Visit (INDEPENDENT_AMBULATORY_CARE_PROVIDER_SITE_OTHER): Payer: Medicare Other | Admitting: Physician Assistant

## 2016-08-15 VITALS — BP 126/70 | HR 80 | Ht 66.5 in | Wt 159.2 lb

## 2016-08-15 DIAGNOSIS — K5909 Other constipation: Secondary | ICD-10-CM

## 2016-08-15 DIAGNOSIS — R194 Change in bowel habit: Secondary | ICD-10-CM | POA: Diagnosis not present

## 2016-08-15 DIAGNOSIS — R197 Diarrhea, unspecified: Secondary | ICD-10-CM | POA: Diagnosis not present

## 2016-08-15 DIAGNOSIS — K750 Abscess of liver: Secondary | ICD-10-CM | POA: Diagnosis not present

## 2016-08-15 MED ORDER — SACCHAROMYCES BOULARDII 250 MG PO CAPS: ORAL_CAPSULE | ORAL | 2 refills | Status: DC

## 2016-08-15 NOTE — Progress Notes (Signed)
Chief Complaint: Abdominal Pain, Change in bowel habits  HPI:  Brandon [name] is a 76 year old Caucasian male with a recent past history of liver abscess and multiple others listed below,  who presents to clinic today with a complaint of change in bowel habits and abdominal pain.      Patient was last seen in clinic on 07/03/16 by Dr. Fuller Plan and at that time explained that his reflux symptoms were under good control and was requesting a refill of his medication. He was continued on Omeprazole 20 mg daily and told to see his PCP regarding symptoms of joint pain and recent febrile illness with some weight loss.   Patient was admitted to the hospital on 07/10/16 and had diagnosis of liver abscess. He had a drain placed by IR on 6/6 and a PICC line placed and received Rocephin 2 g daily. He has followed with ID since his discharge and PICC line has been removed, he is now on oral antibiotics for the next 4 weeks.   Today, the patient tells me that since diagnosis of his liver abscess he has lost around 24 pounds in the past month and a half. He describes that he was having some abdominal discomfort and cramping, but when he called our office a few days ago he was given Dicyclomine 10 mg which he has been taking 2-3 times a day and this has stopped his abdominal pain.     He does complain of having some "incomplete bowel movements". The patient tells him that he will have soft formed bowel movements, but they will occur 2-3 times a day. He tells me he feels as though "it won't all come out at once". Patient has been using stool softeners typically at night before going to bed ever since he started his iron supplementation, and has occasionally used Senokot in order to have a bowel movement. He tells me that typically he does not have to strain, but "doesn't feel like it all comes out". Patient tells me he had an incomplete bowel movement again this morning. He has at least 2-3 bowel movements per day. Patient is on an  over-the-counter probiotic, "I'm unsure which one", from [place]. He has not seen a change with this. He does describe that he has been trying to increase protein in his diet to gain back some weight, but has not been focusing on fiber.   Patient denies fever, chills, blood in the stool, melena, anorexia, nausea, vomiting or continued abdominal pain.  03/23/15 ENDOSCOPIC IMPRESSION: 1. Two sessile polyps in the sigmoid colon and transverse colon; polypectomies performed with a cold snare 2. Moderate diverticulosis in the sigmoid colon and descending colon 3. Mild diverticulosis in the transverse colon 4. Grade l internal hemorrhoids   ENDOSCOPIC IMPRESSION: 1. Short stricture at the gastroesophageal junction; dilated savary dilators over guidewire 2. Small hiatal hernia  Past Medical History:  Diagnosis Date  . Aortic stenosis    mild echocardiogram 8/09 EF 60%, showed no regional wall motion abnml, mild LVH, mod focal basal septal hypertrophy and mild dyastolic dysfunction. 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 mean gradient across aortic valve was 8 mmHG. also mild L atrial enlargement and normal RV size and function. minimal AS on LHC in 7/10.   . Arthritis    "all over" (07/19/2016)  . BPH (benign prostatic hypertrophy)   . CAD (coronary artery disease)    [place] (7/10) totally occluded proximal RCA with very  robuse L to R collaterals, 50% proximal LAD stenosis, EF 65%, medical management.    . Chicken pox   . Chronic lower back pain    s/p surgical fusion  . Diverticulosis   . Esophageal stricture   . GERD (gastroesophageal reflux disease)   . Heart murmur   . Hepatitis B 1984  . Hiatal hernia   . HLD (hyperlipidemia)   . HTN (hypertension)   . 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 Osteoarthritis   . TIA (transient ischemic attack) 1990s   hx  . Tubular adenoma of colon 2009    Past Surgical History:  Procedure Laterality Date  . BACK  SURGERY    . CATARACT EXTRACTION W/ INTRAOCULAR LENS  IMPLANT, BILATERAL Bilateral 01/2012 - 02/2012  . ESOPHAGOGASTRODUODENOSCOPY (EGD) WITH ESOPHAGEAL DILATION     "couple times" (07/19/2016)  . IR THORACENTESIS ASP PLEURAL SPACE W/IMG GUIDE  07/19/2016  . LAPAROSCOPIC CHOLECYSTECTOMY  1994  . LUMBAR DISC SURGERY  05/1996   L4-5; Dr. Sherwood Gambler   . LUMBAR LAMINECTOMY/DECOMPRESSION MICRODISCECTOMY  10/2002   L3-4. Dr. Sherwood Gambler  . MULTIPLE TOOTH EXTRACTIONS  1980s  . PICC LINE INSERTION  07/15/2016  . POSTERIOR LUMBAR FUSION  09/1996   Ray cage, L4-5 Dr. Rita Ohara  . PROSTATE BIOPSY  ~ 2017    Current Outpatient Prescriptions  Medication Sig Dispense Refill  . amLODipine (NORVASC) 5 MG tablet TAKE 1 TABLET(5 MG) BY MOUTH DAILY. One time fill. Further Rx to come from Cardiology. 30 tablet 0  . amoxicillin-clavulanate (AUGMENTIN) 875-125 MG tablet Take 1 tablet by mouth 2 (two) times daily. 60 tablet 0  . aspirin 81 MG EC tablet Take 81 mg by mouth at bedtime.     . cholecalciferol (VITAMIN D) 1000 UNITS tablet Take 1,000 Units by mouth daily.     Marland Kitchen dicyclomine (BENTYL) 10 MG capsule Take 1 capsule (10 mg total) by mouth 3 (three) times daily as needed for spasms. 90 capsule 0  . iron polysaccharides (NU-IRON) 150 MG capsule Take 1 capsule (150 mg total) by mouth 2 (two) times daily.    . isosorbide mononitrate (IMDUR) 30 MG 24 hr tablet TAKE 1 TABLET BY MOUTH AT BEDTIME 90 tablet 1  . metoprolol tartrate (LOPRESSOR) 25 MG tablet Take 0.5 tablets (12.5 mg total) by mouth 2 (two) times daily. 60 tablet 0  . Multiple Vitamins-Minerals (MULTIVITAMIN PO) Take 1 tablet by mouth daily.    . Omega-3 Fatty Acids (FISH OIL PO) Take 2 capsules by mouth 2 (two) times daily.    Marland Kitchen omeprazole (PRILOSEC) 20 MG capsule TAKE 1 CAPSULE(20 MG) BY MOUTH DAILY 30 capsule 11  . oxyCODONE (OXY IR/ROXICODONE) 5 MG immediate release tablet Take 1 tablet (5 mg total) by mouth every 6 (six) hours as needed for moderate  pain. 40 tablet 0  . senna-docusate (SENOKOT-S) 8.6-50 MG tablet Take 1 tablet by mouth 2 (two) times daily. (Patient taking differently: Take 1 tablet by mouth as needed. )    . simvastatin (ZOCOR) 20 MG tablet Take 1 tablet (20 mg total) by mouth at bedtime. 90 tablet 3  . saccharomyces boulardii (FLORASTOR) 250 MG capsule Take 1 tab twice daily 60 capsule 2   No current facility-administered medications for this visit.     Allergies as of 08/15/2016 - Review Complete 08/15/2016  Allergen Reaction Noted  . Celebrex [celecoxib] Hives and Itching     Family History  Problem Relation Age of Onset  . Heart disease Father 66  Living  . Coronary artery disease Father        CABG  . Alzheimer's disease Mother 34       Deceased  . Arthritis/Rheumatoid Mother   . Stomach cancer Maternal Uncle   . Brain cancer Maternal Aunt        x2  . Arthritis Brother        DJD  . Obesity Daughter        Had Bypass Sx    Social History   Social History  . Marital status: Married    Spouse name: etta  . Number of children: 2  . Years of education: N/A   Occupational History  . retired Retired    disabled due to back problems   Social History Main Topics  . Smoking status: Former Smoker    Packs/day: 1.50    Years: 50.00    Types: Cigarettes    Quit date: 06/2015  . Smokeless tobacco: Never Used     Comment: smoked less than 1 ppd for 40+ years; Using Vapor Cig  . Alcohol use 0.0 oz/week     Comment: 07/19/2016 "might have a few drinks/year"  . Drug use: No  . Sexual activity: No   Other Topics Concern  . Not on file   Social History Narrative   Married (3rd), [place]. 2 children from 1st marriage, 4 step children.    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       Cell # [id number]    Review of Systems:    Constitutional: No weight loss, fever or chills Cardiovascular: No chest  pain Respiratory: No SOB  Gastrointestinal: See HPI and otherwise negative   Physical Exam:  Vital signs: BP 126/70 (BP Location: Left Arm, Patient Position: Sitting, Cuff Size: Normal)   Pulse 80   Ht 5' 6.5" (1.689 m) Comment: height measured without shoes  Wt 159 lb 4 oz (72.2 kg)   BMI 25.32 kg/m   Constitutional:   Pleasant Caucasian male appears to be in NAD, Well developed, Well nourished, alert and cooperative Respiratory: Respirations even and unlabored. Lungs clear to auscultation bilaterally.   No wheezes, crackles, or rhonchi.  Cardiovascular: Normal S1, S2. No MRG. Regular rate and rhythm. No peripheral edema, cyanosis or pallor.  Gastrointestinal:  Soft, nondistended, mild RUQ ttp No rebound or guarding. Normal bowel sounds. No appreciable masses or hepatomegaly. Psychiatric: . Demonstrates good judgement and reason without abnormal affect or behaviors.  MOST RECENT LABS AND IMAGING: CBC    Component Value Date/Time   WBC 7.2 08/09/2016 1136   RBC 4.46 08/09/2016 1136   HGB 12.3 (L) 08/09/2016 1136   HCT 38.0 (L) 08/09/2016 1136   PLT 208.0 08/09/2016 1136   MCV 85.2 08/09/2016 1136   MCH 27.1 07/21/2016 0330   MCHC 32.4 08/09/2016 1136   RDW 17.5 (H) 08/09/2016 1136   LYMPHSABS 1.2 07/21/2016 0330   MONOABS 0.5 07/21/2016 0330   EOSABS 0.0 07/21/2016 0330   BASOSABS 0.0 07/21/2016 0330    CMP     Component Value Date/Time   NA 135 08/09/2016 1136   K 4.1 08/09/2016 1136   CL 100 08/09/2016 1136   CO2 29 08/09/2016 1136   GLUCOSE 123 (H) 08/09/2016 1136   BUN 9 08/09/2016 1136   CREATININE 0.60 08/09/2016 1136   CREATININE 0.76 05/15/2013 1042   CALCIUM 9.1 08/09/2016 1136  PROT 7.3 08/09/2016 1136   ALBUMIN 3.4 (L) 08/09/2016 1136   AST 26 08/09/2016 1136   ALT 24 08/09/2016 1136   ALKPHOS 188 (H) 08/09/2016 1136   BILITOT 0.5 08/09/2016 1136   GFRNONAA >60 07/21/2016 0330   GFRAA >60 07/21/2016 0330   CT ABDOMEN AND PELVIS WITH CONTRAST  07/25/16  TECHNIQUE: Multidetector CT imaging of the abdomen and pelvis was performed using the standard protocol following bolus administration of intravenous contrast.  CONTRAST:  164mL ISOVUE-300 IOPAMIDOL (ISOVUE-300) INJECTION 61%  COMPARISON:  CT scan of July 19, 2016.  FINDINGS: Lower chest: Mild right pleural effusion is noted with adjacent subsegmental atelectasis.  Hepatobiliary: Status post cholecystectomy. Drainage catheter is seen positioned within the multiloculated right hepatic abscess. However, the tip is uncoiled which is interval finding since prior exam. Continued presence of multiloculated abscess is noted which may be slightly improved compared to prior exam. It currently measures 6.3 x 6.2 x 6.1 cm which is slightly decreased compared to prior exam.  Pancreas: Unremarkable. No pancreatic ductal dilatation or surrounding inflammatory changes.  Spleen: Normal in size without focal abnormality.  Adrenals/Urinary Tract: Adrenal glands are unremarkable. Kidneys are normal, without renal calculi, focal lesion, or hydronephrosis. Bladder is unremarkable.  Stomach/Bowel: Stomach is within normal limits. Appendix appears normal. No evidence of bowel wall thickening, distention, or inflammatory changes. Sigmoid diverticulosis is noted.  Vascular/Lymphatic: Atherosclerosis of abdominal aorta is noted. Stable 4.1 cm infrarenal abdominal aortic aneurysm. No significant adenopathy is noted.  Reproductive: Prostate is unremarkable.  Other: Mild amount of fluid is noted in the pelvis, as well as around the liver and spleen.  Musculoskeletal: No acute or significant osseous findings.  IMPRESSION: Drainage catheter tip is seen within the multiloculated right hepatic abscess, although its tip is now uncoiled which is new since prior exam. The abscess is slightly smaller compared to prior exam, currently measuring 6.3 x 6.2 x 6.1 cm.  Aortic  atherosclerosis.  Mild right pleural effusion with adjacent subsegmental atelectasis.  Mild amount of fluid is noted in the pelvis, as well as around the liver and spleen.  Stable 4.1 cm infrarenal abdominal aortic aneurysm. Recommend followup by ultrasound in 1 year. This recommendation follows ACR consensus guidelines: White Paper of the ACR Incidental Findings Committee II on Vascular Findings. J Am Coll Radiol 2013; 10:789-794.   Electronically Signed   By: Marijo Conception, M.D.   On: 07/25/2016 13:51  Assessment: 1. Change in bowel habits: Patient describes "incomplete" bowel movements, likely related to constipation, patient is on pain meds as well as iron which is likely contributing, as well as antibiotics, recommend he start a daily stool softener or laxative 2. Abdominal pain: Patient did relay some abdominal cramping with bowel movements, this is better with Dicyclomine 10 mg daily 3. History of recent liver abscess: Being followed by ID, seems to be resolving with liver enzymes returning to normal, patient still on antibiotics  Plan: 1. Prescribed Florastor to be taken twice daily for the next 90 days 2. Would recommend the patient continue his Dicyclomine 10 mg every morning. Did discuss that this can increase his constipation feelings, but if is controlling his abdominal cramping, recommend he continue it as needed throughout the day and one scheduled in the morning 3. Recommend patient increase fiber in his diet to at least 25-35G/day with a fiber supplement such as Metamucil, Citrucel or Benefiber. 4. Recommend the patient start MiraLAX at least once daily while he is on iron supplementation 5.  Patient to follow with Dr. Fuller Plan in the next 4-6 weeks or sooner if necessary.  Ellouise Newer, PA-C [facility] 08/15/2016, 3:27 PM  Cc: Brunetta Jeans, PA-C

## 2016-08-15 NOTE — Patient Instructions (Addendum)
We have sent the following medications to your pharmacy for you to pick up at your convenience: [place] and [address]   Increase fiber daily with Metamucil, or Citrucel, Benefiber (Equate brand at [facility]).   Take Miralax, 17 grams in 8 oz of water  Up to 4 times daily.   We made you a follow up appointment with Dr. Fuller Plan for 10-09-2016 at 2:15 PM.

## 2016-08-16 LAB — GASTROINTESTINAL PATHOGEN PANEL PCR
C. difficile Tox A/B, PCR: NOT DETECTED
CRYPTOSPORIDIUM, PCR: NOT DETECTED
Campylobacter, PCR: NOT DETECTED
E coli (ETEC) LT/ST PCR: NOT DETECTED
E coli (STEC) stx1/stx2, PCR: NOT DETECTED
E coli 0157, PCR: NOT DETECTED
GIARDIA LAMBLIA, PCR: NOT DETECTED
NOROVIRUS, PCR: NOT DETECTED
ROTAVIRUS, PCR: NOT DETECTED
Salmonella, PCR: NOT DETECTED
Shigella, PCR: NOT DETECTED

## 2016-08-16 NOTE — Progress Notes (Signed)
Reviewed and agree with intial management plan.  Pricilla Riffle. Fuller Plan, MD [facility]

## 2016-08-20 ENCOUNTER — Other Ambulatory Visit: Payer: Self-pay | Admitting: Physician Assistant

## 2016-08-27 DIAGNOSIS — Z5181 Encounter for therapeutic drug level monitoring: Secondary | ICD-10-CM | POA: Insufficient documentation

## 2016-08-27 NOTE — Assessment & Plan Note (Addendum)
Previous incomplete percutaneous evacuation of abscess material s/p IR drain attempt x 2. Will order CT of abdomen w/ contrast in 3 - 4 weeks to re-evaluate abscess and continue course of Augmentin. Refill x 6 weeks for Augmentin sent to pharmacy. LFT's have nearly normalized based on recent blood work from his PCP - he is expected to have FU blood work in a few weeks with him again to follow his anemia. Will request a CMET be added so he does not need an extra lab stick. Hopefully his abscess will be smaller on continued medical therapy and will not need further consideration for surgical intervention. He and his wife understand the plan. He will follow back up with me in 6 weeks to review progress and scan results.

## 2016-08-27 NOTE — Progress Notes (Signed)
Patient Name: Brandon [name] MRN: [id number] DOB: 05-Jan-1941   Reason for Visit: FU pyogenic liver abscess   SUBJECTIVE:  HPI: Brandon [name] is a 76 y.o. male that returns to clinic today to FU on pyogenic liver abscess d/t strep intermedius (blood and fluid cx positive). Percutaneous drain was placed 07/11/16 revealing purulent drainage. Completed OPAT with 2gm Ceftriaxone QD x 3 weeks and has been on PO Augmentin for a little over 3 weeks now. Percutaneous drain was pulled back not draining efficiently and required replacement in different location 07/20/16. He returned to the drain clinic in IR on 6/20 and drain was again found to have uncoiled from site. CT at this time revealed slightly smaller abscess measuring 6.3 x 6.2 x 6.1 cm with mild amount of fluid around liver/spleen and in pelvis.   His wife accompanies him today. Since transitioning to PO therapy he has been feeling great. He has gained back 5 lbs and is now doing more at home. Appetite back to normal. Denies fevers, abdominal pain, difficulty breathing, swelling or confusion. Only complaint today is constipation he associates with iron replacement. Has been started on medication per his GI doc for cramping that has helped a lot.   Patient's Medications  New Prescriptions   No medications on file  Previous Medications   AMLODIPINE (NORVASC) 5 MG TABLET    TAKE 1 TABLET BY MOUTH DAILY   ASPIRIN 81 MG EC TABLET    Take 81 mg by mouth at bedtime.    CHOLECALCIFEROL (VITAMIN D) 1000 UNITS TABLET    Take 1,000 Units by mouth daily.    DICYCLOMINE (BENTYL) 10 MG CAPSULE    Take 1 capsule (10 mg total) by mouth 3 (three) times daily as needed for spasms.   IRON POLYSACCHARIDES (NU-IRON) 150 MG CAPSULE    Take 1 capsule (150 mg total) by mouth 2 (two) times daily.   ISOSORBIDE MONONITRATE (IMDUR) 30 MG 24 HR TABLET    TAKE 1 TABLET BY MOUTH AT BEDTIME   METOPROLOL TARTRATE (LOPRESSOR) 25 MG TABLET    Take 0.5 tablets (12.5 mg total) by  mouth 2 (two) times daily.   MULTIPLE VITAMINS-MINERALS (MULTIVITAMIN PO)    Take 1 tablet by mouth daily.   OMEGA-3 FATTY ACIDS (FISH OIL PO)    Take 2 capsules by mouth 2 (two) times daily.   OMEPRAZOLE (PRILOSEC) 20 MG CAPSULE    TAKE 1 CAPSULE(20 MG) BY MOUTH DAILY   OXYCODONE (OXY IR/ROXICODONE) 5 MG IMMEDIATE RELEASE TABLET    Take 1 tablet (5 mg total) by mouth every 6 (six) hours as needed for moderate pain.   SACCHAROMYCES BOULARDII (FLORASTOR) 250 MG CAPSULE    Take 1 tab twice daily   SENNA-DOCUSATE (SENOKOT-S) 8.6-50 MG TABLET    Take 1 tablet by mouth 2 (two) times daily.   SIMVASTATIN (ZOCOR) 20 MG TABLET    Take 1 tablet (20 mg total) by mouth at bedtime.  Modified Medications   Modified Medication Previous Medication   AMOXICILLIN-CLAVULANATE (AUGMENTIN) 875-125 MG TABLET amoxicillin-clavulanate (AUGMENTIN) 875-125 MG tablet      Take 1 tablet by mouth 2 (two) times daily.    Take 1 tablet by mouth 2 (two) times daily.  Discontinued Medications   No medications on file    Review of Systems  Constitutional: Negative for chills, fever, malaise/fatigue and weight loss.  HENT: Negative for sore throat.        No dental problems  Respiratory: Negative  for cough and sputum production.   Cardiovascular: Negative for chest pain and leg swelling.  Gastrointestinal: Positive for constipation. Negative for abdominal pain, diarrhea and vomiting.  Genitourinary: Negative for dysuria and flank pain.  Skin: Negative for rash.  Neurological: Negative for dizziness and headaches.  Psychiatric/Behavioral: Negative for depression.    Past Medical History:  Diagnosis Date  . Aortic stenosis    mild echocardiogram 8/09 EF 60%, showed no regional wall motion abnml, mild LVH, mod focal basal septal hypertrophy and mild dyastolic dysfunction. 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 mean gradient across aortic valve was 8 mmHG. also mild L atrial  enlargement and normal RV size and function. minimal AS on LHC in 7/10.   . Arthritis    "all over" (07/19/2016)  . BPH (benign prostatic hypertrophy)   . CAD (coronary artery disease)    [facility] (7/10) totally occluded proximal RCA with very robuse L to R collaterals, 50% proximal LAD stenosis, EF 65%, medical management.    . Chicken pox   . Chronic lower back pain    s/p surgical fusion  . Diverticulosis   . Esophageal stricture   . GERD (gastroesophageal reflux disease)   . Heart murmur   . Hepatitis B 1984  . Hiatal hernia   . HLD (hyperlipidemia)   . HTN (hypertension)   . 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 Osteoarthritis   . TIA (transient ischemic attack) 1990s   hx  . Tubular adenoma of colon 2009    Allergies  Allergen Reactions  . Celebrex [Celecoxib] Hives and Itching     OBJECTIVE: Vitals:   08/28/16 1342  BP: 135/68  Pulse: 71  Temp: 97.7 F (36.5 C)  TempSrc: Oral  Weight: 165 lb (74.8 kg)  Height: 5\' 7"  (1.702 m)   Body mass index is 25.84 kg/m.  Physical Exam  Constitutional: He is oriented to person, place, and time and well-developed, well-nourished, and in no distress.  HENT:  Mouth/Throat: Oropharynx is clear and moist. No oral lesions. Normal dentition. No dental caries.  Eyes: No scleral icterus.  Cardiovascular: Normal rate and regular rhythm.   Murmur (Systolic) heard. Pulmonary/Chest: Effort normal and breath sounds normal.  Abdominal: Soft. Bowel sounds are normal. He exhibits no distension. There is no tenderness.  Neurological: He is alert and oriented to person, place, and time.  Skin: Skin is warm and dry. No rash noted.  Psychiatric: Mood and affect normal.    ASSESSMENT & PLAN:  Problem List Items Addressed This Visit      Digestive   Liver abscess - Primary    Previous incomplete percutaneous evacuation of abscess material s/p IR drain attempt x 2. Will order CT of abdomen w/ contrast in 3 - 4 weeks to re-evaluate abscess and  continue course of Augmentin. Refill x 6 weeks for Augmentin sent to pharmacy. LFT's have nearly normalized based on recent blood work from his PCP - he is expected to have FU blood work in a few weeks with him again to follow his anemia. Will request a CMET be added so he does not need an extra lab stick. Hopefully his abscess will be smaller on continued medical therapy and will not need further consideration for surgical intervention. He and his wife understand the plan. He will follow back up with me in 6 weeks to review progress and scan results.       Relevant Medications   amoxicillin-clavulanate (AUGMENTIN) 875-125 MG  tablet   Other Relevant Orders   CT ABDOMEN W CONTRAST     Other   Encounter for medication monitoring     Janene Madeira, MSN, [facility]  08/28/2016  5:04 PM

## 2016-08-28 ENCOUNTER — Encounter: Payer: Self-pay | Admitting: Infectious Diseases

## 2016-08-28 ENCOUNTER — Ambulatory Visit (INDEPENDENT_AMBULATORY_CARE_PROVIDER_SITE_OTHER): Payer: Medicare Other | Admitting: Infectious Diseases

## 2016-08-28 VITALS — BP 135/68 | HR 71 | Temp 97.7°F | Ht 67.0 in | Wt 165.0 lb

## 2016-08-28 DIAGNOSIS — Z5181 Encounter for therapeutic drug level monitoring: Secondary | ICD-10-CM

## 2016-08-28 DIAGNOSIS — K750 Abscess of liver: Secondary | ICD-10-CM | POA: Diagnosis not present

## 2016-08-28 MED ORDER — AMOXICILLIN-POT CLAVULANATE 875-125 MG PO TABS: 1.0000 | ORAL_TABLET | Freq: Two times a day (BID) | ORAL | 0 refills | Status: AC

## 2016-08-28 NOTE — Patient Instructions (Addendum)
You will have another month refill sent in for your antibiotic. Continue taking it twice a day. (I gave you 6 weeks worth in case it takes a little longer to get the scan scheduled.) Continue them until you see [place] back.   We will work on getting your CT scan approved and scheduled with hopes to get it done in the next 3-4 weeks.   Will see you back after your scan to check in and review results.

## 2016-08-30 ENCOUNTER — Telehealth: Payer: Self-pay | Admitting: Physician Assistant

## 2016-08-30 DIAGNOSIS — K750 Abscess of liver: Secondary | ICD-10-CM

## 2016-08-30 NOTE — Telephone Encounter (Signed)
Pt needs refill on oxyCODONE

## 2016-08-30 NOTE — Telephone Encounter (Signed)
Indication for chronic opioid: Hepatic abscess Medication and dose: Oxycodone 5 mg # pills per month: #40 on 08/09/16 Last UDS date: 04/03/16 Pain contract signed (Y/N): Yes 08/05/15 Date narcotic database last reviewed (include red flags):

## 2016-08-31 ENCOUNTER — Encounter: Payer: Self-pay | Admitting: Emergency Medicine

## 2016-08-31 ENCOUNTER — Other Ambulatory Visit: Payer: Self-pay | Admitting: Emergency Medicine

## 2016-08-31 DIAGNOSIS — M150 Primary generalized (osteo)arthritis: Principal | ICD-10-CM

## 2016-08-31 DIAGNOSIS — M8949 Other hypertrophic osteoarthropathy, multiple sites: Secondary | ICD-10-CM

## 2016-08-31 DIAGNOSIS — M159 Polyosteoarthritis, unspecified: Secondary | ICD-10-CM

## 2016-08-31 MED ORDER — OXYCODONE HCL 5 MG PO TABS: 5.0000 mg | ORAL_TABLET | Freq: Four times a day (QID) | ORAL | 0 refills | Status: DC | PRN

## 2016-08-31 NOTE — Telephone Encounter (Signed)
Indication for chronic opioid: Osteoarthritis of multiple joints.  Medication and dose: Oxycodone 5 mg.  # pills per month: 40 Last UDS date: 04/03/16 Pain contract signed (Y/N): Y -- 08/05/2015. Needs updated at pickup.  Date narcotic database last reviewed (include red flags): 08/31/16. No flags. A copy to be scanned into EMR.   Refill granted. Will send note to supervising MD for approval for continued therapy past this Rx.  Patient to pick up Rx in office. Needs to sign updated CSC. No urine needed today.

## 2016-08-31 NOTE — Telephone Encounter (Signed)
Advised patient that rx is ready at front desk. He does need to sign a new CSC

## 2016-09-03 ENCOUNTER — Telehealth: Payer: Self-pay

## 2016-09-03 NOTE — Telephone Encounter (Signed)
Called Pt and gave all upcoming appointment information. Pt states he'll comply and plans to pick up contrast sometime this week (Thursday of Friday). Pt given address and phone number to imaging center.

## 2016-09-03 NOTE — Telephone Encounter (Signed)
Called Pt to inform him of upcoming appointment at [facility] on August 7th at 35 at [place] [address]. Pt will need to bring in his I.D and insurance card and arrive at 1210. Pt should be advised to pick up his contrast between now (July 30) and Monday August 6th from [facility]. At that time he will receive instructions on how to use contrast and other information pertaining to imaging appointment. Called Pt, was unable to explain reason for call or give details as he stated he's "driving on the highway and can't hear" at this time. Pt asked to be called back in 20 to 30 minutes. Will call back again later this evening.

## 2016-09-03 NOTE — Telephone Encounter (Signed)
Can you close this phone note please.

## 2016-09-06 ENCOUNTER — Encounter: Payer: Self-pay | Admitting: Emergency Medicine

## 2016-09-06 ENCOUNTER — Telehealth: Payer: Self-pay | Admitting: *Deleted

## 2016-09-06 NOTE — Telephone Encounter (Signed)
Call from [facility] stating that upcoming CT needs prior authorization through patient's insurance. Test is scheduled for 09/11/16. Note sent to Janine Ores, [place].

## 2016-09-11 ENCOUNTER — Ambulatory Visit
Admission: RE | Admit: 2016-09-11 | Discharge: 2016-09-11 | Disposition: A | Discharge status: Home / Self Care | Payer: Medicare Other | Source: Ambulatory Visit | Attending: Infectious Diseases | Admitting: Infectious Diseases

## 2016-09-11 DIAGNOSIS — K750 Abscess of liver: Secondary | ICD-10-CM | POA: Diagnosis not present

## 2016-09-11 MED ORDER — IOPAMIDOL (ISOVUE-300) INJECTION 61%
100.0000 mL | Freq: Once | INTRAVENOUS | Status: AC | PRN
  Administered 2016-09-11: 100 mL via INTRAVENOUS

## 2016-09-19 ENCOUNTER — Other Ambulatory Visit: Payer: Self-pay | Admitting: Physician Assistant

## 2016-09-19 ENCOUNTER — Ambulatory Visit (INDEPENDENT_AMBULATORY_CARE_PROVIDER_SITE_OTHER): Payer: Medicare Other | Admitting: Physician Assistant

## 2016-09-19 ENCOUNTER — Encounter: Payer: Self-pay | Admitting: Physician Assistant

## 2016-09-19 VITALS — BP 120/64 | HR 72 | Temp 98.5°F | Resp 14 | Ht 67.0 in | Wt 166.0 lb

## 2016-09-19 DIAGNOSIS — M150 Primary generalized (osteo)arthritis: Secondary | ICD-10-CM

## 2016-09-19 DIAGNOSIS — I7143 Infrarenal abdominal aortic aneurysm, without rupture: Secondary | ICD-10-CM

## 2016-09-19 DIAGNOSIS — M25552 Pain in left hip: Principal | ICD-10-CM

## 2016-09-19 DIAGNOSIS — I714 Abdominal aortic aneurysm, without rupture: Secondary | ICD-10-CM

## 2016-09-19 DIAGNOSIS — D649 Anemia, unspecified: Secondary | ICD-10-CM | POA: Diagnosis not present

## 2016-09-19 DIAGNOSIS — M8949 Other hypertrophic osteoarthropathy, multiple sites: Secondary | ICD-10-CM

## 2016-09-19 DIAGNOSIS — M159 Polyosteoarthritis, unspecified: Secondary | ICD-10-CM

## 2016-09-19 DIAGNOSIS — G8929 Other chronic pain: Secondary | ICD-10-CM

## 2016-09-19 LAB — CBC WITH DIFFERENTIAL/PLATELET
BASOS ABS: 0 10*3/uL (ref 0.0–0.1)
Basophils Relative: 0.5 % (ref 0.0–3.0)
EOS ABS: 0.2 10*3/uL (ref 0.0–0.7)
Eosinophils Relative: 2.9 % (ref 0.0–5.0)
HCT: 41.8 % (ref 39.0–52.0)
Hemoglobin: 13.6 g/dL (ref 13.0–17.0)
LYMPHS ABS: 1.8 10*3/uL (ref 0.7–4.0)
LYMPHS PCT: 29.8 % (ref 12.0–46.0)
MCHC: 32.4 g/dL (ref 30.0–36.0)
MCV: 85.8 fl (ref 78.0–100.0)
MONO ABS: 0.5 10*3/uL (ref 0.1–1.0)
Monocytes Relative: 7.8 % (ref 3.0–12.0)
NEUTROS ABS: 3.6 10*3/uL (ref 1.4–7.7)
NEUTROS PCT: 59 % (ref 43.0–77.0)
PLATELETS: 119 10*3/uL — AB (ref 150.0–400.0)
RBC: 4.87 Mil/uL (ref 4.22–5.81)
RDW: 16 % — AB (ref 11.5–15.5)
WBC: 6.1 10*3/uL (ref 4.0–10.5)

## 2016-09-19 LAB — COMPREHENSIVE METABOLIC PANEL
ALK PHOS: 112 U/L (ref 39–117)
ALT: 30 U/L (ref 0–53)
AST: 29 U/L (ref 0–37)
Albumin: 4.1 g/dL (ref 3.5–5.2)
BILIRUBIN TOTAL: 0.5 mg/dL (ref 0.2–1.2)
BUN: 9 mg/dL (ref 6–23)
CHLORIDE: 104 meq/L (ref 96–112)
CO2: 29 meq/L (ref 19–32)
Calcium: 9.1 mg/dL (ref 8.4–10.5)
Creatinine, Ser: 0.6 mg/dL (ref 0.40–1.50)
GFR: 140 mL/min (ref >60.00)
Glucose, Bld: 102 mg/dL — ABNORMAL HIGH (ref 70–99)
Potassium: 4 mEq/L (ref 3.5–5.1)
Sodium: 138 mEq/L (ref 135–145)
Total Protein: 6.3 g/dL (ref 6.0–8.3)

## 2016-09-19 MED ORDER — OXYCODONE HCL 5 MG PO TABS: 5.0000 mg | ORAL_TABLET | Freq: Four times a day (QID) | ORAL | 0 refills | Status: DC | PRN

## 2016-09-19 NOTE — Progress Notes (Signed)
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 Is taking medications as directed. Is staying well-hydrated and eating a well-balanced diet. Denies fever, chills, malaise or fatigue. Denies hematochezia. Has cut back iron to once daily. Is due for repeat CBC today.   Past Medical History:  Diagnosis Date  . Aortic stenosis    mild echocardiogram 8/09 EF 60%, showed no regional wall motion abnml, mild LVH, mod focal basal septal hypertrophy and mild dyastolic dysfunction. 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 mean gradient across aortic valve was 8 mmHG. also mild L atrial enlargement and normal RV size and function. minimal AS on LHC in 7/10.   . Arthritis    "all over" (07/19/2016)  . BPH (benign prostatic hypertrophy)   . CAD (coronary artery disease)    LCH (7/10) totally occluded proximal RCA with very robuse L to R collaterals, 50% proximal LAD stenosis, EF 65%, medical management.    . Chicken pox   . Chronic lower back pain    s/p surgical fusion  . Diverticulosis   . Esophageal stricture   . GERD (gastroesophageal reflux disease)   . Heart murmur   . Hepatitis B 1984  . Hiatal hernia   . HLD (hyperlipidemia)   . HTN (hypertension)   . 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 Osteoarthritis   . TIA (transient ischemic attack) 1990s   hx  . Tubular adenoma of colon 2009    Current Outpatient Prescriptions on File Prior to Visit  Medication Sig Dispense Refill  . amLODipine (NORVASC) 5 MG tablet TAKE 1 TABLET BY MOUTH DAILY 90 tablet 1  . amoxicillin-clavulanate (AUGMENTIN) 875-125 MG tablet Take 1 tablet by mouth 2 (two) times daily. 84 tablet 0  . aspirin 81 MG EC tablet Take 81 mg by mouth at bedtime.     . cholecalciferol (VITAMIN D) 1000 UNITS tablet Take 1,000 Units by mouth daily.     Marland Kitchen dicyclomine (BENTYL) 10 MG capsule Take 1 capsule (10 mg total) by mouth 3  (three) times daily as needed for spasms. 90 capsule 0  . iron polysaccharides (NU-IRON) 150 MG capsule Take 1 capsule (150 mg total) by mouth 2 (two) times daily.    . isosorbide mononitrate (IMDUR) 30 MG 24 hr tablet TAKE 1 TABLET BY MOUTH AT BEDTIME 90 tablet 1  . metoprolol tartrate (LOPRESSOR) 25 MG tablet Take 0.5 tablets (12.5 mg total) by mouth 2 (two) times daily. 60 tablet 0  . Multiple Vitamins-Minerals (MULTIVITAMIN PO) Take 1 tablet by mouth daily.    . Omega-3 Fatty Acids (FISH OIL PO) Take 2 capsules by mouth 2 (two) times daily.    Marland Kitchen omeprazole (PRILOSEC) 20 MG capsule TAKE 1 CAPSULE(20 MG) BY MOUTH DAILY 30 capsule 11  . saccharomyces boulardii (FLORASTOR) 250 MG capsule Take 1 tab twice daily 60 capsule 2  . senna-docusate (SENOKOT-S) 8.6-50 MG tablet Take 1 tablet by mouth 2 (two) times daily. (Patient taking differently: Take 1 tablet by mouth as needed. )    . simvastatin (ZOCOR) 20 MG tablet Take 1 tablet (20 mg total) by mouth at bedtime. 90 tablet 3   No current facility-administered medications on file prior to visit.     Allergies  Allergen Reactions  . Celebrex [Celecoxib] Hives and Itching    Family History  Problem Relation Age of Onset  . Heart disease Father 33  Living  . Coronary artery disease Father        CABG  . Alzheimer's disease Mother 63       Deceased  . Arthritis/Rheumatoid Mother   . Stomach cancer Maternal Uncle   . Brain cancer Maternal Aunt        x2  . Arthritis Brother        DJD  . Obesity Daughter        Had Bypass Sx    Social History   Social History  . Marital status: Married    Spouse name: etta  . Number of children: 2  . Years of education: N/A   Occupational History  . retired Retired    disabled due to back problems   Social History Main Topics  . Smoking status: Former Smoker    Packs/day: 1.50    Years: 50.00    Types: Cigarettes    Quit date: 06/2015  . Smokeless tobacco: Never Used     Comment:  smoked less than 1 ppd for 40+ years; Using Vapor Cig  . Alcohol use 0.0 oz/week     Comment: 07/19/2016 "might have a few drinks/year"  . Drug use: No  . Sexual activity: No   Other Topics Concern  . None   Social History Narrative   Married (3rd), [place]. 2 children from 1st marriage, 4 step children.    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       Cell # [id number]   Review of Systems - See HPI.  All other ROS are negative.  BP 120/64   Pulse 72   Temp 98.5 F (36.9 C) (Oral)   Resp 14   Ht '5\' 7"'  (1.702 m)   Wt 166 lb (75.3 kg)   SpO2 98%   BMI 26.00 kg/m   Physical Exam  Constitutional: He is oriented to person, place, and time and well-developed, well-nourished, and in no distress.  HENT:  Head: Normocephalic and atraumatic.  Eyes: Conjunctivae are normal.  Neck: Neck supple.  Cardiovascular: Normal rate, regular rhythm, normal heart sounds and intact distal pulses.   Pulmonary/Chest: Effort normal and breath sounds normal. No respiratory distress. He has no wheezes. He has no rales. He exhibits no tenderness.  Neurological: He is alert and oriented to person, place, and time.  Skin: Skin is warm and dry. No rash noted.  Psychiatric: Affect normal.  Vitals reviewed.   Recent Results (from the past 2160 hour(s))  CBC with Differential/Platelet     Status: Abnormal   Collection Time: 07/04/16 10:52 AM  Result Value Ref Range   WBC 8.5 4.0 - 10.5 K/uL   RBC 4.65 4.22 - 5.81 Mil/uL   Hemoglobin 13.0 13.0 - 17.0 g/dL   HCT 39.1 39.0 - 52.0 %   MCV 84.0 78.0 - 100.0 fl   MCHC 33.1 30.0 - 36.0 g/dL   RDW 14.1 11.5 - 15.5 %   Platelets 216.0 150.0 - 400.0 K/uL   Neutrophils Relative % 79.4 (H) 43.0 - 77.0 %   Lymphocytes Relative 13.1 12.0 - 46.0 %   Monocytes Relative 6.6 3.0 - 12.0 %   Eosinophils Relative 0.6 0.0 - 5.0 %   Basophils Relative 0.3 0.0 - 3.0 %   Neutro Abs 6.8  1.4 - 7.7 K/uL   Lymphs Abs 1.1 0.7 - 4.0 K/uL   Monocytes Absolute 0.6 0.1 - 1.0  K/uL   Eosinophils Absolute 0.1 0.0 - 0.7 K/uL   Basophils Absolute 0.0 0.0 - 0.1 K/uL  Comprehensive metabolic panel     Status: Abnormal   Collection Time: 07/04/16 10:52 AM  Result Value Ref Range   Sodium 138 135 - 145 mEq/L   Potassium 4.0 3.5 - 5.1 mEq/L   Chloride 102 96 - 112 mEq/L   CO2 30 19 - 32 mEq/L   Glucose, Bld 122 (H) 70 - 99 mg/dL   BUN 8 6 - 23 mg/dL   Creatinine, Ser 0.76 0.40 - 1.50 mg/dL   Total Bilirubin 0.7 0.2 - 1.2 mg/dL   Alkaline Phosphatase 173 (H) 39 - 117 U/L   AST 17 0 - 37 U/L   ALT 18 0 - 53 U/L   Total Protein 6.4 6.0 - 8.3 g/dL   Albumin 3.6 3.5 - 5.2 g/dL   Calcium 9.0 8.4 - 10.5 mg/dL   GFR 106.63 >60.00 mL/min  TSH     Status: None   Collection Time: 07/04/16 10:52 AM  Result Value Ref Range   TSH 0.95 0.35 - 4.50 uIU/mL  CMV abs, IgG+IgM (cytomegalovirus)     Status: Abnormal   Collection Time: 07/04/16 10:52 AM  Result Value Ref Range   Cytomegalovirus Ab-IgG 6.10 (H) <0.60 U/mL    Comment:        Value         Interpretation      =====         ==============      < 0.60         Negative      0.61-0.69      Equivocal      >= 0.70        Positive   A positive result indicates that the patient has antibody to CMV. It does not differentiate between an active or past infection.      CMV IgM <30.00 <30.00 AU/mL    Comment:        Value           Interpretation      =====           ==============      < 30.00         Negative      30.00-34.99     Equivocal      >= 35.00        Positive   Results from any one IgM assay should not be used as a sole determinant of a current or recent infection. Because an IgM test can yield false positive results and low level IgM antibody may persist for more than 12 months post infection, reliance on a single test result could be misleading. Acute infection is best diagnosed by demonstrating the conversion of IgG  from negative to positive. If an acute infection is suspected, consider obtaining a new specimen and submit for both IgG and IgM testing in two or more weeks.     Epstein-Barr Virus VCA Antibody Panel     Status: Abnormal   Collection Time: 07/04/16 10:52 AM  Result Value Ref Range   EBV VCA IgG >750.00 (H) U/mL    Comment:                                 U/mL         Interpretation                          -------------     --------------                                <  18.00       Negative                          18.00 - 21.99       Equivocal                                > 21.99       Positive      EBV VCA IgM <36.00 U/mL    Comment:                                  U/mL         Interpretation                           -------------     --------------                                 < 36.00       Negative                           36.00 - 43.99       Equivocal                                 > 43.99       Positive      EBV NA IgG 261.00 (H) U/mL    Comment:                                 U/mL         Interpretation                          -------------     --------------                                < 18.00       Negative                          18.00 - 21.99       Equivocal                                > 21.99       Positive      Interpretation SEE NOTE     Comment:   Suggestive of a past Epstein-Barr Virus infection.  In infants, a similar pattern may occur as a result of passive maternal transfer of antibody.     Sed Rate (ESR)     Status: Abnormal   Collection Time: 07/04/16 10:52 AM  Result Value Ref Range   Sed Rate 33 (H) 0 - 20 mm/hr  POCT Urinalysis Dipstick     Status: Abnormal   Collection Time: 07/04/16 10:53 AM  Result Value Ref Range   Color, UA yellow    Clarity, UA clear  Glucose, UA negative    Bilirubin, UA negative    Ketones, UA negative    Spec Grav, UA 1.010 1.010 - 1.025   Blood, UA negative    pH, UA 7.5 5.0 - 8.0    Protein, UA trace    Urobilinogen, UA 1.0 0.2 or 1.0 E.U./dL   Nitrite, UA negative    Leukocytes, UA Negative Negative  Comprehensive metabolic panel     Status: Abnormal   Collection Time: 07/06/16  9:22 PM  Result Value Ref Range   Sodium 137 135 - 145 mmol/L   Potassium 3.3 (L) 3.5 - 5.1 mmol/L   Chloride 102 101 - 111 mmol/L   CO2 25 22 - 32 mmol/L   Glucose, Bld 116 (H) 65 - 99 mg/dL   BUN 12 6 - 20 mg/dL   Creatinine, Ser 0.89 0.61 - 1.24 mg/dL   Calcium 8.5 (L) 8.9 - 10.3 mg/dL   Total Protein 7.1 6.5 - 8.1 g/dL   Albumin 3.1 (L) 3.5 - 5.0 g/dL   AST 28 15 - 41 U/L   ALT 28 17 - 63 U/L   Alkaline Phosphatase 196 (H) 38 - 126 U/L   Total Bilirubin 1.2 0.3 - 1.2 mg/dL   GFR calc non Af Amer >60 >60 mL/min   GFR calc Af Amer >60 >60 mL/min    Comment: (NOTE) The eGFR has been calculated using the CKD EPI equation. This calculation has not been validated in all clinical situations. eGFR's persistently <60 mL/min signify possible Chronic Kidney Disease.    Anion gap 10 5 - 15  CBC with Differential     Status: Abnormal   Collection Time: 07/06/16  9:22 PM  Result Value Ref Range   WBC 13.0 (H) 4.0 - 10.5 K/uL   RBC 4.46 4.22 - 5.81 MIL/uL   Hemoglobin 12.3 (L) 13.0 - 17.0 g/dL   HCT 37.5 (L) 39.0 - 52.0 %   MCV 84.1 78.0 - 100.0 fL   MCH 27.6 26.0 - 34.0 pg   MCHC 32.8 30.0 - 36.0 g/dL   RDW 13.5 11.5 - 15.5 %   Platelets 165 150 - 400 K/uL   Neutrophils Relative % 84 %   Neutro Abs 11.0 (H) 1.7 - 7.7 K/uL   Lymphocytes Relative 7 %   Lymphs Abs 1.0 0.7 - 4.0 K/uL   Monocytes Relative 9 %   Monocytes Absolute 1.1 (H) 0.1 - 1.0 K/uL   Eosinophils Relative 0 %   Eosinophils Absolute 0.0 0.0 - 0.7 K/uL   Basophils Relative 0 %   Basophils Absolute 0.0 0.0 - 0.1 K/uL  Protime-INR     Status: Abnormal   Collection Time: 07/06/16  9:22 PM  Result Value Ref Range   Prothrombin Time 15.7 (H) 11.4 - 15.2 seconds   INR 1.25   Culture, blood (Routine x 2)     Status:  None   Collection Time: 07/06/16  9:22 PM  Result Value Ref Range   Specimen Description BLOOD LEFT FOREARM    Special Requests BOTTLES DRAWN AEROBIC AND ANAEROBIC BCAV    Culture      NO GROWTH 5 DAYS Performed at [facility], [address], [place], [place] [address]    Report Status 07/12/2016 FINAL   Culture, blood (Routine x 2)     Status: None   Collection Time: 07/06/16  9:27 PM  Result Value Ref Range   Specimen Description BLOOD RAC    Special Requests  BOTTLES DRAWN AEROBIC AND ANAEROBIC BCAV    Culture      NO GROWTH 5 DAYS Performed at [facility], [place] [address] [place], [place] [address]    Report Status 07/12/2016 FINAL   I-Stat CG4 Lactic Acid, ED     Status: None   Collection Time: 07/06/16  9:44 PM  Result Value Ref Range   Lactic Acid, Venous 1.23 0.5 - 1.9 mmol/L  Urinalysis, Routine w reflex microscopic     Status: Abnormal   Collection Time: 07/06/16  9:55 PM  Result Value Ref Range   Color, Urine AMBER (A) YELLOW    Comment: BIOCHEMICALS MAY BE AFFECTED BY COLOR   APPearance CLEAR CLEAR   Specific Gravity, Urine 1.019 1.005 - 1.030   pH 5.0 5.0 - 8.0   Glucose, UA NEGATIVE NEGATIVE mg/dL   Hgb urine dipstick SMALL (A) NEGATIVE   Bilirubin Urine NEGATIVE NEGATIVE   Ketones, ur NEGATIVE NEGATIVE mg/dL   Protein, ur 30 (A) NEGATIVE mg/dL   Nitrite NEGATIVE NEGATIVE   Leukocytes, UA NEGATIVE NEGATIVE   RBC / HPF 0-5 0 - 5 RBC/hpf   WBC, UA 0-5 0 - 5 WBC/hpf   Bacteria, UA NONE SEEN NONE SEEN   Squamous Epithelial / LPF NONE SEEN NONE SEEN   Mucous PRESENT   Acute Hep Panel & Hep B Surface Ab     Status: None   Collection Time: 07/09/16  3:20 PM  Result Value Ref Range   Hepatitis B Surface Ag NEGATIVE NEGATIVE   Hep B C IgM NON REACTIVE NON REACTIVE    Comment: High levels of Hepatitis B Core IgM antibody are detectable during the acute stage of Hepatitis B. This antibody is used to differentiate current from past HBV infection.        Hep B S Ab NEG NEGATIVE   Hep A IgM NON REACTIVE NON REACTIVE   HCV Ab NEGATIVE NEGATIVE  [place]y mtn spotted fvr abs pnl(IgG+IgM)     Status: None   Collection Time: 07/09/16  3:20 PM  Result Value Ref Range   RMSF IgG NOT DETECTED NOT DETECTED   RMSF IgM NOT DETECTED NOT DETECTED    Comment: Cross-reactivity between the Spotted Fever and Typhus groups is minor. Significant cross reactivity within the Spotted Fever or Typhus group precludes speciation of the rickettsiae.   CBC with Differential/Platelet     Status: Abnormal   Collection Time: 07/09/16  3:20 PM  Result Value Ref Range   WBC 13.4 (H) 4.0 - 10.5 K/uL   RBC 4.30 4.22 - 5.81 Mil/uL   Hemoglobin 11.7 (L) 13.0 - 17.0 g/dL   HCT 35.7 (L) 39.0 - 52.0 %   MCV 83.0 78.0 - 100.0 fl   MCHC 32.7 30.0 - 36.0 g/dL   RDW 15.0 11.5 - 15.5 %   Platelets 115.0 (L) 150.0 - 400.0 K/uL   Neutrophils Relative % 85.6 (H) 43.0 - 77.0 %    Comment: manual diff=76seg,6band,14lymph,84moo   Lymphocytes Relative 10.0 (L) 12.0 - 46.0 %   Monocytes Relative 4.0 3.0 - 12.0 %   Eosinophils Relative 0.3 0.0 - 5.0 %   Basophils Relative 0.1 0.0 - 3.0 %   Neutro Abs 11.5 (H) 1.4 - 7.7 K/uL   Lymphs Abs 1.3 0.7 - 4.0 K/uL   Monocytes Absolute 0.5 0.1 - 1.0 K/uL   Eosinophils Absolute 0.0 0.0 - 0.7 K/uL   Basophils Absolute 0.0 0.0 - 0.1 K/uL  Hepatic function panel  Status: Abnormal   Collection Time: 07/09/16  3:20 PM  Result Value Ref Range   Total Bilirubin 1.3 (H) 0.2 - 1.2 mg/dL   Bilirubin, Direct 0.8 (H) 0.0 - 0.3 mg/dL   Alkaline Phosphatase 247 (H) 39 - 117 U/L   AST 77 (H) 0 - 37 U/L   ALT 86 (H) 0 - 53 U/L   Total Protein 5.5 (L) 6.0 - 8.3 g/dL   Albumin 2.9 (L) 3.5 - 5.2 g/dL  Antinuclear Antib (ANA)     Status: None   Collection Time: 07/09/16  3:20 PM  Result Value Ref Range   Anit Nuclear Antibody(ANA) NEG NEGATIVE    Comment: Although the specimen was negative for antinuclear antibodies (ANA), the presence of  cytoplasmic fluorescence was noted on the HEp-2 slide. Other reactivities (e.g., anti-mitochondrial antibodies or anti-smooth muscle antibodies) may be responsible for this fluorescence.   Lyme Ab/Western Blot Reflex     Status: None   Collection Time: 07/09/16  3:20 PM  Result Value Ref Range   B burgdorferi Ab IgG+IgM <0.90 <0.90 Index    Comment:                      Index                Interpretation                    -----                --------------                    < 0.90               NEGATIVE                    0.90-1.09            EQUIVOCAL                    > 1.09               POSITIVE   As recommended by the Food and Drug Administration (FDA), all samples with positive or equivocal results on the B. burgdorferi antibody screen will be tested by Western Blot/Immunoblot. Positive or equivocal screening test results should not be interpreted as truly positive until verified as such using a supplemental assay (e.g., B. burgdorferi blot).   The screening test and/or blot for B. burgdorferi antibodies may be falsely negative in early stages of Lyme disease, including the period when erythema migrans is apparent.     Comprehensive metabolic panel     Status: Abnormal   Collection Time: 07/10/16  5:35 PM  Result Value Ref Range   Sodium 140 135 - 145 mmol/L   Potassium 3.7 3.5 - 5.1 mmol/L   Chloride 107 101 - 111 mmol/L   CO2 24 22 - 32 mmol/L   Glucose, Bld 120 (H) 65 - 99 mg/dL   BUN 35 (H) 6 - 20 mg/dL   Creatinine, Ser 0.95 0.61 - 1.24 mg/dL   Calcium 7.8 (L) 8.9 - 10.3 mg/dL   Total Protein 5.3 (L) 6.5 - 8.1 g/dL   Albumin 2.1 (L) 3.5 - 5.0 g/dL   AST 47 (H) 15 - 41 U/L   ALT 56 17 - 63 U/L   Alkaline Phosphatase 310 (H) 38 - 126 U/L   Total Bilirubin 1.9 (H) 0.3 -  1.2 mg/dL   GFR calc non Af Amer >60 >60 mL/min   GFR calc Af Amer >60 >60 mL/min    Comment: (NOTE) The eGFR has been calculated using the CKD EPI equation. This calculation has not been  validated in all clinical situations. eGFR's persistently <60 mL/min signify possible Chronic Kidney Disease.    Anion gap 9 5 - 15  CBC WITH DIFFERENTIAL     Status: Abnormal   Collection Time: 07/10/16  5:35 PM  Result Value Ref Range   WBC 10.0 4.0 - 10.5 K/uL   RBC 3.77 (L) 4.22 - 5.81 MIL/uL   Hemoglobin 10.3 (L) 13.0 - 17.0 g/dL   HCT 30.9 (L) 39.0 - 52.0 %   MCV 82.0 78.0 - 100.0 fL   MCH 27.3 26.0 - 34.0 pg   MCHC 33.3 30.0 - 36.0 g/dL   RDW 14.7 11.5 - 15.5 %   Platelets 118 (L) 150 - 400 K/uL    Comment: SPECIMEN CHECKED FOR CLOTS REPEATED TO VERIFY PLATELET COUNT CONFIRMED BY SMEAR    Neutrophils Relative % 85 %   Lymphocytes Relative 8 %   Monocytes Relative 7 %   Eosinophils Relative 0 %   Basophils Relative 0 %   Neutro Abs 8.5 (H) 1.7 - 7.7 K/uL   Lymphs Abs 0.8 0.7 - 4.0 K/uL   Monocytes Absolute 0.7 0.1 - 1.0 K/uL   Eosinophils Absolute 0.0 0.0 - 0.7 K/uL   Basophils Absolute 0.0 0.0 - 0.1 K/uL   Smear Review MORPHOLOGY UNREMARKABLE   Blood Culture (routine x 2)     Status: Abnormal   Collection Time: 07/10/16  5:35 PM  Result Value Ref Range   Specimen Description BLOOD LEFT ARM    Special Requests      BOTTLES DRAWN AEROBIC AND ANAEROBIC Blood Culture adequate volume   Culture  Setup Time      GRAM POSITIVE COCCI IN CHAINS ANAEROBIC BOTTLE ONLY Organism ID to follow CRITICAL RESULT CALLED TO, READ BACK BY AND VERIFIED WITH: J. Scherrie November Pharm.D. 11:15 07/12/16 ([email or web address])    Culture (A)     STREPTOCOCCUS INTERMEDIUS SUSCEPTIBILITIES PERFORMED ON PREVIOUS CULTURE WITHIN THE LAST 5 DAYS. Performed at [facility], [place] [address] [place], [place] [address]    Report Status 07/15/2016 FINAL   Urinalysis, Routine w reflex microscopic     Status: Abnormal   Collection Time: 07/10/16  5:35 PM  Result Value Ref Range   Color, Urine YELLOW YELLOW   APPearance CLEAR CLEAR   Specific Gravity, Urine 1.038 (H) 1.005 - 1.030   pH 6.0 5.0 - 8.0   Glucose,  UA NEGATIVE NEGATIVE mg/dL   Hgb urine dipstick NEGATIVE NEGATIVE   Bilirubin Urine NEGATIVE NEGATIVE   Ketones, ur NEGATIVE NEGATIVE mg/dL   Protein, ur NEGATIVE NEGATIVE mg/dL   Nitrite NEGATIVE NEGATIVE   Leukocytes, UA NEGATIVE NEGATIVE  Blood Culture ID Panel (Reflexed)     Status: Abnormal   Collection Time: 07/10/16  5:35 PM  Result Value Ref Range   Enterococcus species NOT DETECTED NOT DETECTED   Listeria monocytogenes NOT DETECTED NOT DETECTED   Staphylococcus species NOT DETECTED NOT DETECTED   Staphylococcus aureus NOT DETECTED NOT DETECTED   Streptococcus species DETECTED (A) NOT DETECTED    Comment: Not Enterococcus species, Streptococcus agalactiae, Streptococcus pyogenes, or Streptococcus pneumoniae. CRITICAL RESULT CALLED TO, READ BACK BY AND VERIFIED WITH: J. Scherrie November Pharm.D. 11:15 07/12/16 ([email or web address])    Streptococcus agalactiae NOT DETECTED NOT DETECTED  Streptococcus pneumoniae NOT DETECTED NOT DETECTED   Streptococcus pyogenes NOT DETECTED NOT DETECTED   Acinetobacter baumannii NOT DETECTED NOT DETECTED   Enterobacteriaceae species NOT DETECTED NOT DETECTED   Enterobacter cloacae complex NOT DETECTED NOT DETECTED   Escherichia coli NOT DETECTED NOT DETECTED   Klebsiella oxytoca NOT DETECTED NOT DETECTED   Klebsiella pneumoniae NOT DETECTED NOT DETECTED   Proteus species NOT DETECTED NOT DETECTED   Serratia marcescens NOT DETECTED NOT DETECTED   Haemophilus influenzae NOT DETECTED NOT DETECTED   Neisseria meningitidis NOT DETECTED NOT DETECTED   Pseudomonas aeruginosa NOT DETECTED NOT DETECTED   Candida albicans NOT DETECTED NOT DETECTED   Candida glabrata NOT DETECTED NOT DETECTED   Candida krusei NOT DETECTED NOT DETECTED   Candida parapsilosis NOT DETECTED NOT DETECTED   Candida tropicalis NOT DETECTED NOT DETECTED    Comment: Performed at [facility], [place] [address], [place], [place] [address]  Blood Culture (routine x 2)     Status: Abnormal    Collection Time: 07/10/16  5:43 PM  Result Value Ref Range   Specimen Description BLOOD RIGHT HAND    Special Requests      BOTTLES DRAWN AEROBIC AND ANAEROBIC Blood Culture adequate volume   Culture  Setup Time      GRAM POSITIVE COCCI IN CHAINS ANAEROBIC BOTTLE ONLY CRITICAL VALUE NOTED.  VALUE IS CONSISTENT WITH PREVIOUSLY REPORTED AND CALLED VALUE. Performed at [facility], [place] [address] [place], [place] [address]    Culture STREPTOCOCCUS INTERMEDIUS (A)    Report Status 07/15/2016 FINAL    Organism ID, Bacteria STREPTOCOCCUS INTERMEDIUS       Susceptibility   Streptococcus intermedius - MIC*    PENICILLIN <=0.06 SENSITIVE Sensitive     CEFTRIAXONE 0.25 SENSITIVE Sensitive     ERYTHROMYCIN >=8 RESISTANT Resistant     LEVOFLOXACIN <=0.25 SENSITIVE Sensitive     VANCOMYCIN 0.5 SENSITIVE Sensitive     * STREPTOCOCCUS INTERMEDIUS  I-Stat CG4 Lactic Acid, ED  (not at  [facility])     Status: None   Collection Time: 07/10/16  5:46 PM  Result Value Ref Range   Lactic Acid, Venous 1.31 0.5 - 1.9 mmol/L  Vitamin B12     Status: Abnormal   Collection Time: 07/10/16 10:55 PM  Result Value Ref Range   Vitamin B-12 4,419 (H) 180 - 914 pg/mL    Comment: (NOTE) This assay is not validated for testing neonatal or myeloproliferative syndrome specimens for Vitamin B12 levels. Performed at [facility], [place] [address], [place], [place] [address]   Folate     Status: None   Collection Time: 07/10/16 10:55 PM  Result Value Ref Range   Folate 7.4 >5.9 ng/mL    Comment: Performed at [facility], [place] [address] [place], [place] [address]  Iron and TIBC     Status: Abnormal   Collection Time: 07/10/16 10:55 PM  Result Value Ref Range   Iron 6 (L) 45 - 182 ug/dL   TIBC 134 (L) 250 - 450 ug/dL   Saturation Ratios 4 (L) 17.9 - 39.5 %   UIBC 128 ug/dL    Comment: Performed at [place] [address] [place], [place] [address]  Ferritin     Status: Abnormal    Collection Time: 07/10/16 10:55 PM  Result Value Ref Range   Ferritin 499 (H) 24 - 336 ng/mL    Comment: Performed at [facility], [address] [place], [place] [address]  Reticulocytes     Status: Abnormal   Collection Time: 07/10/16 10:55 PM  Result Value Ref Range   Retic Ct Pct 0.5 0.4 - 3.1 %   RBC. 3.82 (L) 4.22 - 5.81 MIL/uL   Retic Count, Absolute 19.1 19.0 - 186.0 K/uL  E. histolytica antibody (amoeba ab)     Status: None   Collection Time: 07/10/16 10:55 PM  Result Value Ref Range   E histolytica Ab Negative Negative    Comment: (NOTE) Performed At: [facility] [place], [place] [id number] Lindon Romp MD QT:[id number]   Strep pneumoniae urinary antigen     Status: None   Collection Time: 07/10/16 11:47 PM  Result Value Ref Range   Strep Pneumo Urinary Antigen NEGATIVE NEGATIVE    Comment:        Infection due to S. pneumoniae cannot be absolutely ruled out since the antigen present may be below the detection limit of the test. Performed at [facility], [place] [address] [address], [place], [place] [address]   CBC with Differential/Platelet     Status: Abnormal   Collection Time: 07/11/16  5:17 AM  Result Value Ref Range   WBC 11.5 (H) 4.0 - 10.5 K/uL   RBC 3.69 (L) 4.22 - 5.81 MIL/uL   Hemoglobin 10.1 (L) 13.0 - 17.0 g/dL   HCT 30.6 (L) 39.0 - 52.0 %   MCV 82.9 78.0 - 100.0 fL   MCH 27.4 26.0 - 34.0 pg   MCHC 33.0 30.0 - 36.0 g/dL   RDW 14.9 11.5 - 15.5 %   Platelets 95 (L) 150 - 400 K/uL    Comment: CONSISTENT WITH PREVIOUS RESULT   Neutrophils Relative % 86 %   Neutro Abs 9.9 (H) 1.7 - 7.7 K/uL   Lymphocytes Relative 7 %   Lymphs Abs 0.8 0.7 - 4.0 K/uL   Monocytes Relative 7 %   Monocytes Absolute 0.7 0.1 - 1.0 K/uL   Eosinophils Relative 0 %   Eosinophils Absolute 0.0 0.0 - 0.7 K/uL   Basophils Relative 0 %   Basophils Absolute 0.0 0.0 - 0.1 K/uL  Protime-INR     Status: Abnormal   Collection Time: 07/11/16  5:17 AM    Result Value Ref Range   Prothrombin Time 17.3 (H) 11.4 - 15.2 seconds   INR 1.41   Comprehensive metabolic panel     Status: Abnormal   Collection Time: 07/11/16  5:17 AM  Result Value Ref Range   Sodium 141 135 - 145 mmol/L   Potassium 3.6 3.5 - 5.1 mmol/L   Chloride 111 101 - 111 mmol/L   CO2 24 22 - 32 mmol/L   Glucose, Bld 91 65 - 99 mg/dL   BUN 27 (H) 6 - 20 mg/dL   Creatinine, Ser 0.79 0.61 - 1.24 mg/dL   Calcium 7.4 (L) 8.9 - 10.3 mg/dL   Total Protein 5.0 (L) 6.5 - 8.1 g/dL   Albumin 1.8 (L) 3.5 - 5.0 g/dL   AST 49 (H) 15 - 41 U/L   ALT 52 17 - 63 U/L   Alkaline Phosphatase 286 (H) 38 - 126 U/L   Total Bilirubin 2.1 (H) 0.3 - 1.2 mg/dL   GFR calc non Af Amer >60 >60 mL/min   GFR calc Af Amer >60 >60 mL/min    Comment: (NOTE) The eGFR has been calculated using the CKD EPI equation. This calculation has not been validated in all clinical  situations. eGFR's persistently <60 mL/min signify possible Chronic Kidney Disease.    Anion gap 6 5 - 15  Aerobic/Anaerobic Culture (surgical/deep wound)     Status: None   Collection Time: 07/11/16  5:22 PM  Result Value Ref Range   Specimen Description ABSCESS HEPATIC    Special Requests NONE    Gram Stain      ABUNDANT WBC PRESENT, PREDOMINANTLY PMN ABUNDANT GRAM POSITIVE COCCI IN PAIRS    Culture      ABUNDANT STREPTOCOCCUS INTERMEDIUS NO ANAEROBES ISOLATED Performed at [facility], [place] [address] [place], [place] [address]    Report Status 07/16/2016 FINAL    Organism ID, Bacteria STREPTOCOCCUS INTERMEDIUS       Susceptibility   Streptococcus intermedius - MIC*    PENICILLIN <=0.06 SENSITIVE Sensitive     CEFTRIAXONE <=0.12 SENSITIVE Sensitive     ERYTHROMYCIN >=8 RESISTANT Resistant     LEVOFLOXACIN <=0.25 SENSITIVE Sensitive     VANCOMYCIN 0.25 SENSITIVE Sensitive     * ABUNDANT STREPTOCOCCUS INTERMEDIUS  MRSA PCR Screening     Status: None   Collection Time: 07/11/16  6:54 PM  Result Value Ref Range    MRSA by PCR NEGATIVE NEGATIVE    Comment:        The GeneXpert MRSA Assay (FDA approved for NASAL specimens only), is one component of a comprehensive MRSA colonization surveillance program. It is not intended to diagnose MRSA infection nor to guide or monitor treatment for MRSA infections.   Comprehensive metabolic panel     Status: Abnormal   Collection Time: 07/12/16  8:59 AM  Result Value Ref Range   Sodium 142 135 - 145 mmol/L   Potassium 3.3 (L) 3.5 - 5.1 mmol/L   Chloride 111 101 - 111 mmol/L   CO2 26 22 - 32 mmol/L   Glucose, Bld 110 (H) 65 - 99 mg/dL   BUN 29 (H) 6 - 20 mg/dL   Creatinine, Ser 0.89 0.61 - 1.24 mg/dL   Calcium 7.9 (L) 8.9 - 10.3 mg/dL   Total Protein 5.2 (L) 6.5 - 8.1 g/dL   Albumin 1.9 (L) 3.5 - 5.0 g/dL   AST 41 15 - 41 U/L   ALT 45 17 - 63 U/L   Alkaline Phosphatase 309 (H) 38 - 126 U/L   Total Bilirubin 1.8 (H) 0.3 - 1.2 mg/dL   GFR calc non Af Amer >60 >60 mL/min   GFR calc Af Amer >60 >60 mL/min    Comment: (NOTE) The eGFR has been calculated using the CKD EPI equation. This calculation has not been validated in all clinical situations. eGFR's persistently <60 mL/min signify possible Chronic Kidney Disease.    Anion gap 5 5 - 15  CBC     Status: Abnormal   Collection Time: 07/12/16  8:59 AM  Result Value Ref Range   WBC 11.8 (H) 4.0 - 10.5 K/uL   RBC 3.61 (L) 4.22 - 5.81 MIL/uL   Hemoglobin 9.9 (L) 13.0 - 17.0 g/dL   HCT 30.3 (L) 39.0 - 52.0 %   MCV 83.9 78.0 - 100.0 fL   MCH 27.4 26.0 - 34.0 pg   MCHC 32.7 30.0 - 36.0 g/dL   RDW 15.2 11.5 - 15.5 %   Platelets 114 (L) 150 - 400 K/uL    Comment: CONSISTENT WITH PREVIOUS RESULT  Ammonia     Status: Abnormal   Collection Time: 07/12/16  2:57 PM  Result Value Ref Range   Ammonia 39 (  H) 9 - 35 umol/L  Comprehensive metabolic panel     Status: Abnormal   Collection Time: 07/13/16  3:20 AM  Result Value Ref Range   Sodium 140 135 - 145 mmol/L   Potassium 3.5 3.5 - 5.1 mmol/L    Chloride 111 101 - 111 mmol/L   CO2 23 22 - 32 mmol/L   Glucose, Bld 113 (H) 65 - 99 mg/dL   BUN 21 (H) 6 - 20 mg/dL   Creatinine, Ser 0.75 0.61 - 1.24 mg/dL   Calcium 7.5 (L) 8.9 - 10.3 mg/dL   Total Protein 4.6 (L) 6.5 - 8.1 g/dL   Albumin 1.7 (L) 3.5 - 5.0 g/dL   AST 32 15 - 41 U/L   ALT 35 17 - 63 U/L   Alkaline Phosphatase 404 (H) 38 - 126 U/L   Total Bilirubin 1.3 (H) 0.3 - 1.2 mg/dL   GFR calc non Af Amer >60 >60 mL/min   GFR calc Af Amer >60 >60 mL/min    Comment: (NOTE) The eGFR has been calculated using the CKD EPI equation. This calculation has not been validated in all clinical situations. eGFR's persistently <60 mL/min signify possible Chronic Kidney Disease.    Anion gap 6 5 - 15  CBC     Status: Abnormal   Collection Time: 07/13/16  3:20 AM  Result Value Ref Range   WBC 10.5 4.0 - 10.5 K/uL   RBC 3.47 (L) 4.22 - 5.81 MIL/uL   Hemoglobin 9.5 (L) 13.0 - 17.0 g/dL   HCT 28.7 (L) 39.0 - 52.0 %   MCV 82.7 78.0 - 100.0 fL   MCH 27.4 26.0 - 34.0 pg   MCHC 33.1 30.0 - 36.0 g/dL   RDW 15.3 11.5 - 15.5 %   Platelets 127 (L) 150 - 400 K/uL  Comprehensive metabolic panel     Status: Abnormal   Collection Time: 07/14/16  8:02 AM  Result Value Ref Range   Sodium 140 135 - 145 mmol/L   Potassium 3.5 3.5 - 5.1 mmol/L   Chloride 108 101 - 111 mmol/L   CO2 24 22 - 32 mmol/L   Glucose, Bld 96 65 - 99 mg/dL   BUN 16 6 - 20 mg/dL   Creatinine, Ser 0.58 (L) 0.61 - 1.24 mg/dL   Calcium 7.6 (L) 8.9 - 10.3 mg/dL   Total Protein 4.7 (L) 6.5 - 8.1 g/dL   Albumin 1.7 (L) 3.5 - 5.0 g/dL   AST 23 15 - 41 U/L   ALT 26 17 - 63 U/L   Alkaline Phosphatase 435 (H) 38 - 126 U/L   Total Bilirubin 1.5 (H) 0.3 - 1.2 mg/dL   GFR calc non Af Amer >60 >60 mL/min   GFR calc Af Amer >60 >60 mL/min    Comment: (NOTE) The eGFR has been calculated using the CKD EPI equation. This calculation has not been validated in all clinical situations. eGFR's persistently <60 mL/min signify possible  Chronic Kidney Disease.    Anion gap 8 5 - 15  Comprehensive metabolic panel     Status: Abnormal   Collection Time: 07/15/16  6:15 AM  Result Value Ref Range   Sodium 140 135 - 145 mmol/L   Potassium 3.6 3.5 - 5.1 mmol/L   Chloride 105 101 - 111 mmol/L   CO2 26 22 - 32 mmol/L   Glucose, Bld 101 (H) 65 - 99 mg/dL   BUN 16 6 - 20 mg/dL   Creatinine, Ser 0.65 0.61 -  1.24 mg/dL   Calcium 7.7 (L) 8.9 - 10.3 mg/dL   Total Protein 4.8 (L) 6.5 - 8.1 g/dL   Albumin 1.7 (L) 3.5 - 5.0 g/dL   AST 18 15 - 41 U/L   ALT 20 17 - 63 U/L   Alkaline Phosphatase 427 (H) 38 - 126 U/L   Total Bilirubin 1.2 0.3 - 1.2 mg/dL   GFR calc non Af Amer >60 >60 mL/min   GFR calc Af Amer >60 >60 mL/min    Comment: (NOTE) The eGFR has been calculated using the CKD EPI equation. This calculation has not been validated in all clinical situations. eGFR's persistently <60 mL/min signify possible Chronic Kidney Disease.    Anion gap 9 5 - 15  CBC     Status: Abnormal   Collection Time: 07/18/16 12:46 PM  Result Value Ref Range   WBC 6.7 4.0 - 10.5 K/uL   RBC 3.45 (L) 4.22 - 5.81 Mil/uL   Platelets 233.0 150.0 - 400.0 K/uL   Hemoglobin 9.4 (L) 13.0 - 17.0 g/dL   HCT 29.4 (L) 39.0 - 52.0 %   MCV 85.1 78.0 - 100.0 fl   MCHC 32.1 30.0 - 36.0 g/dL   RDW 16.1 (H) 11.5 - 15.5 %  Comp Met (CMET)     Status: Abnormal   Collection Time: 07/18/16 12:46 PM  Result Value Ref Range   Sodium 139 135 - 145 mEq/L   Potassium 4.0 3.5 - 5.1 mEq/L   Chloride 104 96 - 112 mEq/L   CO2 31 19 - 32 mEq/L   Glucose, Bld 115 (H) 70 - 99 mg/dL   BUN 9 6 - 23 mg/dL   Creatinine, Ser 0.51 0.40 - 1.50 mg/dL   Total Bilirubin 0.8 0.2 - 1.2 mg/dL   Alkaline Phosphatase 425 (H) 39 - 117 U/L   AST 19 0 - 37 U/L   ALT 11 0 - 53 U/L   Total Protein 5.0 (L) 6.0 - 8.3 g/dL   Albumin 2.1 (L) 3.5 - 5.2 g/dL   Calcium 7.6 (L) 8.4 - 10.5 mg/dL   GFR 168.96 >60.00 mL/min  PH, Body Fluid     Status: None   Collection Time: 07/19/16  4:14  PM  Result Value Ref Range   pH, Body Fluid 8.0 Not Estab.    Comment: (NOTE) This test was developed and its performance characteristics determined by [facility]. It has not been cleared or approved by the Food and Drug Administration. Performed At: [facility] [place], [place] [id number] Lindon Romp MD AS:[id number]    Source of Sample PLEU RIGHT   Protein, pleural or peritoneal fluid     Status: None   Collection Time: 07/19/16  4:14 PM  Result Value Ref Range   Total protein, fluid <3.0 g/dL    Comment: (NOTE) No normal range established for this test Results should be evaluated in conjunction with serum values    Fluid Type-FTP PLEU RIGHT   Body fluid cell count with differential     Status: Abnormal   Collection Time: 07/19/16  4:24 PM  Result Value Ref Range   Fluid Type-FCT FLUID     Comment: RIGHT PLEURAL CORRECTED ON 06/14 AT 1629: PREVIOUSLY REPORTED AS Pleural R    Color, Fluid ORANGE (A) YELLOW   Appearance, Fluid CLOUDY (A) CLEAR   WBC, Fluid 10,250 (H) 0 - 1,000 cu mm   Neutrophil Count, Fluid 82 (H) 0 - 25 %   Lymphs,  Fluid 4 %   Monocyte-Macrophage-Serous Fluid 14 (L) 50 - 90 %   Eos, Fluid 0 %   Other Cells, Fluid 0 %  Culture, body fluid-bottle     Status: None   Collection Time: 07/19/16  4:24 PM  Result Value Ref Range   Specimen Description PLEURAL    Special Requests RIGHT PLEURAL    Culture NO GROWTH 5 DAYS    Report Status 07/24/2016 FINAL   Gram stain     Status: None   Collection Time: 07/19/16  4:24 PM  Result Value Ref Range   Specimen Description PLEURAL    Special Requests RIGHT PLEURAL    Gram Stain      ABUNDANT WBC PRESENT, PREDOMINANTLY PMN NO ORGANISMS SEEN    Report Status 07/19/2016 FINAL   Pathologist smear review     Status: None   Collection Time: 07/19/16  4:24 PM  Result Value Ref Range   Path Review Mixed inflammatory cells, predominantly     Comment: neutrophils. Reviewed by Lennox Solders  Lyndon Code, M.D. 07/20/2016   Lactate dehydrogenase, body fluid     Status: Abnormal   Collection Time: 07/19/16  4:24 PM  Result Value Ref Range   LD, Fluid 149 (H) 3 - 23 U/L    Comment: (NOTE) Results should be evaluated in conjunction with serum values    Fluid Type-FLDH PLEU RIGHT     Comment: CORRECTED ON 06/15 AT 1300: PREVIOUSLY REPORTED AS Pleural R  Glucose, pleural or peritoneal fluid     Status: None   Collection Time: 07/19/16  4:24 PM  Result Value Ref Range   Glucose, Fluid 113 mg/dL    Comment: (NOTE) No normal range established for this test Results should be evaluated in conjunction with serum values    Fluid Type-FGLU PLEU RIGHT     Comment: CORRECTED ON 06/15 AT 1300: PREVIOUSLY REPORTED AS Pleural R  CBC with Differential/Platelet     Status: Abnormal   Collection Time: 07/19/16  6:18 PM  Result Value Ref Range   WBC 6.4 4.0 - 10.5 K/uL   RBC 3.38 (L) 4.22 - 5.81 MIL/uL   Hemoglobin 9.1 (L) 13.0 - 17.0 g/dL   HCT 28.9 (L) 39.0 - 52.0 %   MCV 85.5 78.0 - 100.0 fL   MCH 26.9 26.0 - 34.0 pg   MCHC 31.5 30.0 - 36.0 g/dL   RDW 16.4 (H) 11.5 - 15.5 %   Platelets 252 150 - 400 K/uL   Neutrophils Relative % 71 %   Neutro Abs 4.6 1.7 - 7.7 K/uL   Lymphocytes Relative 23 %   Lymphs Abs 1.5 0.7 - 4.0 K/uL   Monocytes Relative 4 %   Monocytes Absolute 0.3 0.1 - 1.0 K/uL   Eosinophils Relative 1 %   Eosinophils Absolute 0.0 0.0 - 0.7 K/uL   Basophils Relative 1 %   Basophils Absolute 0.0 0.0 - 0.1 K/uL  Comprehensive metabolic panel     Status: Abnormal   Collection Time: 07/19/16  6:18 PM  Result Value Ref Range   Sodium 136 135 - 145 mmol/L   Potassium 3.5 3.5 - 5.1 mmol/L   Chloride 102 101 - 111 mmol/L   CO2 26 22 - 32 mmol/L   Glucose, Bld 107 (H) 65 - 99 mg/dL   BUN 5 (L) 6 - 20 mg/dL   Creatinine, Ser 0.54 (L) 0.61 - 1.24 mg/dL   Calcium 7.5 (L) 8.9 - 10.3 mg/dL   Total Protein 5.6 (  L) 6.5 - 8.1 g/dL   Albumin 1.8 (L) 3.5 - 5.0 g/dL   AST 24 15 - 41  U/L   ALT 15 (L) 17 - 63 U/L   Alkaline Phosphatase 359 (H) 38 - 126 U/L   Total Bilirubin 0.9 0.3 - 1.2 mg/dL   GFR calc non Af Amer >60 >60 mL/min   GFR calc Af Amer >60 >60 mL/min    Comment: (NOTE) The eGFR has been calculated using the CKD EPI equation. This calculation has not been validated in all clinical situations. eGFR's persistently <60 mL/min signify possible Chronic Kidney Disease.    Anion gap 8 5 - 15  Protime-INR     Status: Abnormal   Collection Time: 07/19/16  6:18 PM  Result Value Ref Range   Prothrombin Time 16.1 (H) 11.4 - 15.2 seconds   INR 1.29   Culture, blood (routine x 2)     Status: None   Collection Time: 07/19/16  6:18 PM  Result Value Ref Range   Specimen Description BLOOD LEFT ANTECUBITAL    Special Requests      BOTTLES DRAWN AEROBIC AND ANAEROBIC Blood Culture adequate volume   Culture NO GROWTH 5 DAYS    Report Status 07/24/2016 FINAL   Culture, blood (routine x 2)     Status: None   Collection Time: 07/19/16  6:18 PM  Result Value Ref Range   Specimen Description BLOOD RIGHT HAND    Special Requests      BOTTLES DRAWN AEROBIC AND ANAEROBIC Blood Culture adequate volume   Culture NO GROWTH 5 DAYS    Report Status 07/24/2016 FINAL   Basic metabolic panel     Status: Abnormal   Collection Time: 07/20/16  3:10 AM  Result Value Ref Range   Sodium 134 (L) 135 - 145 mmol/L   Potassium 3.5 3.5 - 5.1 mmol/L   Chloride 101 101 - 111 mmol/L   CO2 26 22 - 32 mmol/L   Glucose, Bld 100 (H) 65 - 99 mg/dL   BUN <5 (L) 6 - 20 mg/dL   Creatinine, Ser 0.61 0.61 - 1.24 mg/dL   Calcium 7.5 (L) 8.9 - 10.3 mg/dL   GFR calc non Af Amer >60 >60 mL/min   GFR calc Af Amer >60 >60 mL/min    Comment: (NOTE) The eGFR has been calculated using the CKD EPI equation. This calculation has not been validated in all clinical situations. eGFR's persistently <60 mL/min signify possible Chronic Kidney Disease.    Anion gap 7 5 - 15  CBC     Status: Abnormal    Collection Time: 07/20/16  3:10 AM  Result Value Ref Range   WBC 6.0 4.0 - 10.5 K/uL   RBC 3.01 (L) 4.22 - 5.81 MIL/uL   Hemoglobin 8.1 (L) 13.0 - 17.0 g/dL   HCT 25.9 (L) 39.0 - 52.0 %   MCV 86.0 78.0 - 100.0 fL   MCH 26.9 26.0 - 34.0 pg   MCHC 31.3 30.0 - 36.0 g/dL   RDW 16.7 (H) 11.5 - 15.5 %   Platelets 227 150 - 400 K/uL  Vitamin B12     Status: Abnormal   Collection Time: 07/20/16  8:42 AM  Result Value Ref Range   Vitamin B-12 3,488 (H) 180 - 914 pg/mL    Comment: (NOTE) This assay is not validated for testing neonatal or myeloproliferative syndrome specimens for Vitamin B12 levels.   Folate     Status: None   Collection Time:  07/20/16  8:42 AM  Result Value Ref Range   Folate 9.0 >5.9 ng/mL  Iron and TIBC     Status: Abnormal   Collection Time: 07/20/16  8:42 AM  Result Value Ref Range   Iron 10 (L) 45 - 182 ug/dL   TIBC 143 (L) 250 - 450 ug/dL   Saturation Ratios 7 (L) 17.9 - 39.5 %   UIBC 133 ug/dL  Ferritin     Status: None   Collection Time: 07/20/16  8:42 AM  Result Value Ref Range   Ferritin 307 24 - 336 ng/mL  Lactate dehydrogenase     Status: None   Collection Time: 07/20/16 12:27 PM  Result Value Ref Range   LDH 161 98 - 192 U/L  CBC with Differential/Platelet     Status: Abnormal   Collection Time: 07/21/16  3:30 AM  Result Value Ref Range   WBC 7.4 4.0 - 10.5 K/uL   RBC 2.99 (L) 4.22 - 5.81 MIL/uL   Hemoglobin 8.1 (L) 13.0 - 17.0 g/dL   HCT 25.7 (L) 39.0 - 52.0 %   MCV 86.0 78.0 - 100.0 fL   MCH 27.1 26.0 - 34.0 pg   MCHC 31.5 30.0 - 36.0 g/dL   RDW 16.7 (H) 11.5 - 15.5 %   Platelets 233 150 - 400 K/uL   Neutrophils Relative % 76 %   Neutro Abs 5.6 1.7 - 7.7 K/uL   Lymphocytes Relative 16 %   Lymphs Abs 1.2 0.7 - 4.0 K/uL   Monocytes Relative 7 %   Monocytes Absolute 0.5 0.1 - 1.0 K/uL   Eosinophils Relative 1 %   Eosinophils Absolute 0.0 0.0 - 0.7 K/uL   Basophils Relative 0 %   Basophils Absolute 0.0 0.0 - 0.1 K/uL  Comprehensive  metabolic panel     Status: Abnormal   Collection Time: 07/21/16  3:30 AM  Result Value Ref Range   Sodium 135 135 - 145 mmol/L   Potassium 4.1 3.5 - 5.1 mmol/L   Chloride 101 101 - 111 mmol/L   CO2 28 22 - 32 mmol/L   Glucose, Bld 101 (H) 65 - 99 mg/dL   BUN 5 (L) 6 - 20 mg/dL   Creatinine, Ser 0.73 0.61 - 1.24 mg/dL   Calcium 7.6 (L) 8.9 - 10.3 mg/dL   Total Protein 5.2 (L) 6.5 - 8.1 g/dL   Albumin 1.6 (L) 3.5 - 5.0 g/dL   AST 28 15 - 41 U/L   ALT 14 (L) 17 - 63 U/L   Alkaline Phosphatase 308 (H) 38 - 126 U/L   Total Bilirubin 0.9 0.3 - 1.2 mg/dL   GFR calc non Af Amer >60 >60 mL/min   GFR calc Af Amer >60 >60 mL/min    Comment: (NOTE) The eGFR has been calculated using the CKD EPI equation. This calculation has not been validated in all clinical situations. eGFR's persistently <60 mL/min signify possible Chronic Kidney Disease.    Anion gap 6 5 - 15  Comp Met (CMET)     Status: Abnormal   Collection Time: 07/27/16 10:25 AM  Result Value Ref Range   Sodium 138 135 - 145 mEq/L   Potassium 4.2 3.5 - 5.1 mEq/L   Chloride 101 96 - 112 mEq/L   CO2 29 19 - 32 mEq/L   Glucose, Bld 113 (H) 70 - 99 mg/dL   BUN 9 6 - 23 mg/dL   Creatinine, Ser 0.60 0.40 - 1.50 mg/dL   Total Bilirubin 0.5  0.2 - 1.2 mg/dL   Alkaline Phosphatase 259 (H) 39 - 117 U/L   AST 26 0 - 37 U/L   ALT 15 0 - 53 U/L   Total Protein 6.2 6.0 - 8.3 g/dL   Albumin 2.7 (L) 3.5 - 5.2 g/dL   Calcium 8.5 8.4 - 10.5 mg/dL   GFR 140.05 >60.00 mL/min  CBC     Status: Abnormal   Collection Time: 07/27/16 10:25 AM  Result Value Ref Range   WBC 5.1 4.0 - 10.5 K/uL   RBC 3.83 (L) 4.22 - 5.81 Mil/uL   Platelets 217.0 150.0 - 400.0 K/uL   Hemoglobin 10.5 (L) 13.0 - 17.0 g/dL   HCT 32.6 (L) 39.0 - 52.0 %   MCV 85.2 78.0 - 100.0 fl   MCHC 32.0 30.0 - 36.0 g/dL   RDW 17.2 (H) 11.5 - 15.5 %  CBC and differential     Status: Abnormal   Collection Time: 08/01/16 12:00 AM  Result Value Ref Range   Hemoglobin 10.3 (A)  13.5 - 17.5   HCT 33 (A) 41 - 53   Neutrophils Absolute 4    Platelets 245 150 - 399   WBC 5.7   Basic metabolic panel     Status: None   Collection Time: 08/01/16 12:00 AM  Result Value Ref Range   BUN 7 4 - 21   Creatinine 0.7 0.6 - 1.3  POCT urinalysis dipstick     Status: Abnormal   Collection Time: 08/01/16  2:19 PM  Result Value Ref Range   Color, UA amber    Clarity, UA cloudy    Glucose, UA negative    Bilirubin, UA negative    Ketones, UA negative    Spec Grav, UA 1.015 1.010 - 1.025   Blood, UA negative    pH, UA 6.5 5.0 - 8.0   Protein, UA +-    Urobilinogen, UA 0.2 0.2 or 1.0 E.U./dL   Nitrite, UA negative    Leukocytes, UA Negative Negative  Urine Culture     Status: None   Collection Time: 08/01/16  2:31 PM  Result Value Ref Range   Organism ID, Bacteria NO GROWTH   ECHOCARDIOGRAM COMPLETE     Status: None   Collection Time: 08/03/16  3:54 PM  Result Value Ref Range   Weight 2,608 oz   Height 70.000 in   BP 149/87 mmHg  CBC     Status: Abnormal   Collection Time: 08/09/16 11:36 AM  Result Value Ref Range   WBC 7.2 4.0 - 10.5 K/uL   RBC 4.46 4.22 - 5.81 Mil/uL   Platelets 208.0 150.0 - 400.0 K/uL   Hemoglobin 12.3 (L) 13.0 - 17.0 g/dL   HCT 38.0 (L) 39.0 - 52.0 %   MCV 85.2 78.0 - 100.0 fl   MCHC 32.4 30.0 - 36.0 g/dL   RDW 17.5 (H) 11.5 - 15.5 %  Comp Met (CMET)     Status: Abnormal   Collection Time: 08/09/16 11:36 AM  Result Value Ref Range   Sodium 135 135 - 145 mEq/L   Potassium 4.1 3.5 - 5.1 mEq/L   Chloride 100 96 - 112 mEq/L   CO2 29 19 - 32 mEq/L   Glucose, Bld 123 (H) 70 - 99 mg/dL   BUN 9 6 - 23 mg/dL   Creatinine, Ser 0.60 0.40 - 1.50 mg/dL   Total Bilirubin 0.5 0.2 - 1.2 mg/dL   Alkaline Phosphatase 188 (H) 39 - 117 U/L  AST 26 0 - 37 U/L   ALT 24 0 - 53 U/L   Total Protein 7.3 6.0 - 8.3 g/dL   Albumin 3.4 (L) 3.5 - 5.2 g/dL   Calcium 9.1 8.4 - 10.5 mg/dL   GFR 140.04 >60.00 mL/min  Gastrointestinal Pathogen Panel PCR      Status: None   Collection Time: 08/15/16  3:01 PM  Result Value Ref Range   Campylobacter, PCR Not Detected    C. difficile Tox A/B, PCR Not Detected    E coli 0157, PCR Not Detected    E coli (ETEC) LT/ST PCR Not Detected    E coli (STEC) stx1/stx2, PCR Not Detected    Salmonella, PCR Not Detected    Shigella, PCR Not Detected    Norovirus, PCR Not Detected    Rotavirus A, PCR Not Detected    Giardia lamblia, PCR Not Detected    Cryptosporidium, PCR Not Detected     Comment:     ** Normal Reference Range for each Analyte: Not Detected **         ** The xTAG Gastrointestinal Pathogen Panel results are presumptive and must be confirmed by FDA-cleared tests or other acceptable reference methods.  The results of this test should not be used as the sole basis for diagnosis, treatment, or other patient management decisions.   Performed using the [employer] xTAG Gastrointestinal Pathogen Panel test kit.     Assessment/Plan: 1. Anemia, unspecified type Doing very well. Repeat levels today. - CBC w/Diff - Comp Met (CMET)  2. Primary osteoarthritis involving multiple joints Patient given refill today. - oxyCODONE (OXY IR/ROXICODONE) 5 MG immediate release tablet; Take 1 tablet (5 mg total) by mouth every 6 (six) hours as needed for moderate pain.  Dispense: 40 tablet; Refill: 0  3. Aneurysm of infrarenal abdominal aorta (HCC) Noted on CT obtained by GI. 4.2 cm. Plan for repeat US in 1 year. Reminder placed. Patient also has follow-up scheduled with Cardiology.   Leeanne Rio, PA-C

## 2016-09-19 NOTE — Progress Notes (Signed)
Pre visit review using our clinic review tool, if applicable. No additional management support is needed unless otherwise documented below in the visit note. 

## 2016-09-19 NOTE — Patient Instructions (Signed)
Please go to the lab for blood work.  I will call you with your results.  Please continue chronic regimen.  I am setting you up with Orthopedics.  Follow-up with me in 3 months. Follow-up with specialists as scheduled.

## 2016-09-20 ENCOUNTER — Other Ambulatory Visit: Payer: Self-pay | Admitting: Physician Assistant

## 2016-09-20 DIAGNOSIS — D696 Thrombocytopenia, unspecified: Secondary | ICD-10-CM

## 2016-10-02 ENCOUNTER — Ambulatory Visit (INDEPENDENT_AMBULATORY_CARE_PROVIDER_SITE_OTHER): Payer: Medicare Other | Admitting: Infectious Diseases

## 2016-10-02 ENCOUNTER — Encounter: Payer: Self-pay | Admitting: Infectious Diseases

## 2016-10-02 VITALS — BP 138/81 | HR 79 | Temp 98.6°F | Wt 168.0 lb

## 2016-10-02 DIAGNOSIS — T148XXA Other injury of unspecified body region, initial encounter: Secondary | ICD-10-CM | POA: Diagnosis not present

## 2016-10-02 DIAGNOSIS — K750 Abscess of liver: Secondary | ICD-10-CM | POA: Diagnosis not present

## 2016-10-02 NOTE — Assessment & Plan Note (Addendum)
Strep intermedius pyogenic liver abscess - He has completed nearly 12 weeks of treatment with IV ceftriaxone and prolonged PO Augmentin due to inability to drain abscess through percutaneous efforts. His liver function has completely normalized and albumin back to normal. Last CT with significant improvement in size. He has even resumed working again part time. Will have him finish out last few days of Augmentin Rx and monitor off therapy. Will have him return in 2 months to follow up again with LFTs and OV.

## 2016-10-02 NOTE — Patient Instructions (Signed)
Look great today  Continue your Augmentin until gone. We will follow up with you in 2 months to see how you are doing off antibiotics.

## 2016-10-02 NOTE — Progress Notes (Signed)
Patient Name: Brandon [name]  MRN: [id number]  DOB: Aug 01, 1940    Reason for Visit: FU Pyogenic Liver Abscess    INFECTIOUS DISEASE OFFICE FOLLOW UP SUBJECTIVE:   Brief Narrative: Brandon [name] is a 76 y.o. that was seen by our team during recent hospitalization at [facility]. July 11 2016 was found to have pyogenic liver abscess with sepsis due to strep intermedius (BCx and fluid Cx +). Percutaneous drain attempted x 2 however unable to remain in ideal position for drainage and was removed. He completed Ceftriaxone x 3 weeks and transitioned to PO Augmentin BID 6/29 for protracted therapy in setting of incomplete drainage of abscess. CT's at the time of drain clinic follow up 6/20 still showed abscess measuring 6.3 x 6.2 x 6.1 cm with mild amount of fluid around liver/spleen and in pelvis.    HPI: Brandon [name] is a 76 y.o. male here for follow up. Has been doing well on his Augmentin and has not missed doses. No diarrhea and actually has occasional constipation issues although this is improved since decreasing / stopping iron supplementation. Only complaint today is bleeding under skin on arms at the smallest bump. Denies fevers, abdominal pain, yellowing of skin/eyes or dark urine. Excellent appetite and regained all 15 lbs now. Has been followed closely with our clinic and his PCP. He is still very concerned on relapsing.   Patient Active Problem List   Diagnosis Date Noted  . Bruising 10/02/2016  . 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 Aneurysm of infrarenal abdominal aorta (HCC) 07/10/2016  . Normocytic anemia 07/10/2016  . Tobacco abuse disorder 01/10/2014  . Seasonal allergies 11/23/2013  . Essential hypertension 05/19/2013  . Osteoarthritis 05/29/2009  . 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 Aortic valve disorder 01/28/2008  . BRAIN STEM STROKE 08/27/2007  . 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 BENIGN PROSTATIC HYPERTROPHY, HX OF 08/27/2007  . Hyperlipidemia 03/19/2007  . ANXIETY 03/19/2007  . GERD  03/19/2007    Patient's Medications  New Prescriptions   No medications on file  Previous Medications   AMLODIPINE (NORVASC) 5 MG TABLET    TAKE 1 TABLET BY MOUTH DAILY   AMOXICILLIN-CLAVULANATE (AUGMENTIN) 875-125 MG TABLET    Take 1 tablet by mouth 2 (two) times daily.   ASPIRIN 81 MG EC TABLET    Take 81 mg by mouth at bedtime.    CHOLECALCIFEROL (VITAMIN D) 1000 UNITS TABLET    Take 1,000 Units by mouth daily.    DICYCLOMINE (BENTYL) 10 MG CAPSULE    Take 1 capsule (10 mg total) by mouth 3 (three) times daily as needed for spasms.   IRON POLYSACCHARIDES (NU-IRON) 150 MG CAPSULE    Take 1 capsule (150 mg total) by mouth 2 (two) times daily.   ISOSORBIDE MONONITRATE (IMDUR) 30 MG 24 HR TABLET    TAKE 1 TABLET BY MOUTH AT BEDTIME   METOPROLOL TARTRATE (LOPRESSOR) 25 MG TABLET    Take 0.5 tablets (12.5 mg total) by mouth 2 (two) times daily.   MULTIPLE VITAMINS-MINERALS (MULTIVITAMIN PO)    Take 1 tablet by mouth daily.   OMEGA-3 FATTY ACIDS (FISH OIL PO)    Take 2 capsules by mouth 2 (two) times daily.   OMEPRAZOLE (PRILOSEC) 20 MG CAPSULE    TAKE 1 CAPSULE(20 MG) BY MOUTH DAILY   OXYCODONE (OXY IR/ROXICODONE) 5 MG IMMEDIATE RELEASE TABLET    Take 1 tablet (5 mg total) by mouth every 6 (six) hours as needed for moderate pain.  SACCHAROMYCES BOULARDII (FLORASTOR) 250 MG CAPSULE    Take 1 tab twice daily   SENNA-DOCUSATE (SENOKOT-S) 8.6-50 MG TABLET    Take 1 tablet by mouth 2 (two) times daily.   SIMVASTATIN (ZOCOR) 20 MG TABLET    Take 1 tablet (20 mg total) by mouth at bedtime.  Modified Medications   No medications on file  Discontinued Medications   No medications on file    Review of Systems  Constitutional: Negative for chills, fever, malaise/fatigue and weight loss.  HENT:       No dental problems  Respiratory: Negative for cough and sputum production.   Cardiovascular: Negative for chest pain and leg swelling.  Gastrointestinal: Positive for constipation. Negative for  abdominal pain, diarrhea and vomiting.  Genitourinary: Negative for dysuria and flank pain.  Skin: Negative for rash.  Neurological: Negative for dizziness and headaches.  Psychiatric/Behavioral: Negative for depression.      Past Medical History:  Diagnosis Date  . Aortic stenosis    mild echocardiogram 8/09 EF 60%, showed no regional wall motion abnml, mild LVH, mod focal basal septal hypertrophy and mild dyastolic dysfunction. 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 mean gradient across aortic valve was 8 mmHG. also mild L atrial enlargement and normal RV size and function. minimal AS on LHC in 7/10.   . Arthritis    "all over" (07/19/2016)  . BPH (benign prostatic hypertrophy)   . CAD (coronary artery disease)    L[facility] (7/10) totally occluded proximal RCA with very robuse L to R collaterals, 50% proximal LAD stenosis, EF 65%, medical management.    . Chicken pox   . Chronic lower back pain    s/p surgical fusion  . Diverticulosis   . Esophageal stricture   . GERD (gastroesophageal reflux disease)   . Heart murmur   . Hepatitis B 1984  . Hiatal hernia   . HLD (hyperlipidemia)   . HTN (hypertension)   . 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 Osteoarthritis   . TIA (transient ischemic attack) 1990s   hx  . Tubular adenoma of colon 2009    Social History  Substance Use Topics  . Smoking status: Former Smoker    Packs/day: 1.50    Years: 50.00    Types: Cigarettes    Quit date: 06/2015  . Smokeless tobacco: Never Used     Comment: smoked less than 1 ppd for 40+ years; Using Vapor Cig  . Alcohol use No     Comment: 07/19/2016 "might have a few drinks/year"    Family History  Problem Relation Age of Onset  . Heart disease Father 50       Living  . Coronary artery disease Father        CABG  . Alzheimer's disease Mother 37       Deceased  . Arthritis/Rheumatoid Mother   . Stomach cancer Maternal Uncle   . Brain cancer Maternal Aunt          x2  . Arthritis Brother        DJD  . Obesity Daughter        Had Bypass Sx     Allergies  Allergen Reactions  . Celebrex [Celecoxib] Hives and Itching     OBJECTIVE: Vitals:   10/02/16 1522 10/02/16 1547  BP: (!) 148/77 138/81  Pulse: 80 79  Temp: 98.6 F (37 C)   TempSrc: Oral   Weight: 168 lb (76.2 kg)  Body mass index is 26.31 kg/m.  Physical Exam  Constitutional: He is oriented to person, place, and time and well-developed, well-nourished, and in no distress.  HENT:  Mouth/Throat: Oropharynx is clear and moist. No oral lesions. Normal dentition. No dental caries.  Eyes: No scleral icterus.  Cardiovascular: Normal rate and regular rhythm.   Murmur (3/6 systolic murmur heard to RUSB ) heard. Pulmonary/Chest: Effort normal and breath sounds normal.  Abdominal: Soft. Bowel sounds are normal. He exhibits no distension. There is no tenderness.  Neurological: He is alert and oriented to person, place, and time.  Skin: Skin is warm and dry. No rash noted.  Psychiatric: Mood and affect normal.   LABS & DIAGNOSTICS:  Lab Results  Component Value Date   WBC 6.1 09/19/2016   HGB 13.6 09/19/2016   HCT 41.8 09/19/2016   MCV 85.8 09/19/2016   PLT 119.0 (L) 09/19/2016   Lab Results  Component Value Date   ALT 30 09/19/2016   AST 29 09/19/2016   ALKPHOS 112 09/19/2016   BILITOT 0.5 09/19/2016    ASSESSMENT & PLAN:  Problem List Items Addressed This Visit      Digestive   Liver abscess - Primary    Strep intermedius pyogenic liver abscess - He has completed nearly 12 weeks of treatment with IV ceftriaxone and prolonged PO Augmentin due to inability to drain abscess through percutaneous efforts. His liver function has completely normalized and albumin back to normal. Last CT with significant improvement in size. He has even resumed working again part time. Will have him finish out last few days of Augmentin Rx and monitor off therapy. Will have him return in 2  months to follow up again with LFTs and OV.         Other   Bruising    Platelets are down a bit to 119k on recent CBC from > 200k - I believe this may be related to amoxicillin component as this can occur with ampicillin class. Hopefully will normalize since we will be stopping Augmentin after a few more days. He is on baby ASA a day and no other blood thinners.          Janene Madeira, MSN, [facility]  10/02/2016  3:59 pm

## 2016-10-02 NOTE — Assessment & Plan Note (Signed)
Platelets are down a bit to 119k on recent CBC from > 200k - I believe this may be related to amoxicillin component as this can occur with ampicillin class. Hopefully will normalize since we will be stopping Augmentin after a few more days. He is on baby ASA a day and no other blood thinners.

## 2016-10-09 ENCOUNTER — Ambulatory Visit: Payer: Medicare Other | Admitting: Gastroenterology

## 2016-10-09 ENCOUNTER — Inpatient Hospital Stay (HOSPITAL_COMMUNITY): Admission: RE | Admit: 2016-10-09 | Payer: Medicare Other | Source: Ambulatory Visit

## 2016-10-16 ENCOUNTER — Telehealth (HOSPITAL_COMMUNITY): Payer: Self-pay | Admitting: Pharmacist

## 2016-10-16 ENCOUNTER — Ambulatory Visit (HOSPITAL_COMMUNITY)
Admission: RE | Admit: 2016-10-16 | Discharge: 2016-10-16 | Disposition: A | Discharge status: Home / Self Care | Payer: Medicare Other | Source: Ambulatory Visit | Attending: Cardiology | Admitting: Cardiology

## 2016-10-16 ENCOUNTER — Other Ambulatory Visit (HOSPITAL_COMMUNITY): Payer: Self-pay | Admitting: Pharmacist

## 2016-10-16 DIAGNOSIS — E784 Other hyperlipidemia: Secondary | ICD-10-CM | POA: Insufficient documentation

## 2016-10-16 DIAGNOSIS — E7849 Other hyperlipidemia: Secondary | ICD-10-CM

## 2016-10-16 LAB — HEPATIC FUNCTION PANEL
ALK PHOS: 95 U/L (ref 38–126)
ALT: 28 U/L (ref 17–63)
AST: 29 U/L (ref 15–41)
Albumin: 3.6 g/dL (ref 3.5–5.0)
BILIRUBIN DIRECT: 0.2 mg/dL (ref 0.1–0.5)
BILIRUBIN INDIRECT: 0.6 mg/dL (ref 0.3–0.9)
BILIRUBIN TOTAL: 0.8 mg/dL (ref 0.3–1.2)
Total Protein: 6.9 g/dL (ref 6.5–8.1)

## 2016-10-16 LAB — LIPID PANEL
CHOLESTEROL: 131 mg/dL (ref 0–200)
HDL: 36 mg/dL — AB (ref >40)
LDL Cholesterol: 79 mg/dL (ref 0–99)
TRIGLYCERIDES: 80 mg/dL (ref <150)
Total CHOL/HDL Ratio: 3.6 RATIO
VLDL: 16 mg/dL (ref 0–40)

## 2016-10-16 MED ORDER — METOPROLOL TARTRATE 25 MG PO TABS: 12.5000 mg | ORAL_TABLET | Freq: Two times a day (BID) | ORAL | 5 refills | Status: DC

## 2016-10-16 NOTE — Telephone Encounter (Signed)
Brandon [name] called asking if he should still be on benazepril that he was on prior to his last hospitalization. It looks like it was d/c'd on discharge because his BP was soft and they were concerned about AKI. He states that his BP is usually in the 110/60s. I have asked him to stay off of it for the time being until he sees Dr. Aundra Dubin again.   Ruta Hinds. Velva Harman, PharmD, BCPS, CPP Clinical Pharmacist Pager: [phone] Phone: [phone] 10/16/2016 1:53 PM

## 2016-10-17 ENCOUNTER — Encounter (HOSPITAL_COMMUNITY): Payer: Self-pay | Admitting: *Deleted

## 2016-10-19 ENCOUNTER — Other Ambulatory Visit: Payer: Self-pay | Admitting: Physician Assistant

## 2016-10-31 ENCOUNTER — Other Ambulatory Visit: Payer: Self-pay | Admitting: Physician Assistant

## 2016-10-31 DIAGNOSIS — M8949 Other hypertrophic osteoarthropathy, multiple sites: Secondary | ICD-10-CM

## 2016-10-31 DIAGNOSIS — M159 Polyosteoarthritis, unspecified: Secondary | ICD-10-CM

## 2016-10-31 DIAGNOSIS — M150 Primary generalized (osteo)arthritis: Principal | ICD-10-CM

## 2016-10-31 NOTE — Telephone Encounter (Signed)
Indication for chronic opioid:Osteoarthritis Medication and dose: Oxycodone 5 mg # pills per month: #40 Last UDS date: 04/03/16 Pain contract signed (Y/N): Yes, 09/01/16 Date narcotic database last reviewed (include red flags):

## 2016-10-31 NOTE — Telephone Encounter (Signed)
Requesting refill of oxyCODONE (OXY IR/ROXICODONE) 5 MG immediate release tablet.

## 2016-11-01 ENCOUNTER — Ambulatory Visit (INDEPENDENT_AMBULATORY_CARE_PROVIDER_SITE_OTHER): Payer: Medicare Other

## 2016-11-01 DIAGNOSIS — Z23 Encounter for immunization: Secondary | ICD-10-CM

## 2016-11-01 MED ORDER — OXYCODONE HCL 5 MG PO TABS: 5.0000 mg | ORAL_TABLET | Freq: Four times a day (QID) | ORAL | 0 refills | Status: DC | PRN

## 2016-11-01 NOTE — Telephone Encounter (Signed)
Rx is ready up front. Patient is aware the rx is ready

## 2016-11-01 NOTE — Telephone Encounter (Signed)
Database reviewed 11/01/16. No red flags. Low NARX score. Will scan report into EMR. Patient had visit 09/2016 regarding chronic pain medication.  UDS up-to-date -- patient low risk.   Rx printed and signed. Ready to be placed up front.

## 2016-11-19 ENCOUNTER — Encounter (HOSPITAL_COMMUNITY): Payer: Self-pay | Admitting: Cardiology

## 2016-11-19 ENCOUNTER — Ambulatory Visit (HOSPITAL_COMMUNITY)
Admission: RE | Admit: 2016-11-19 | Discharge: 2016-11-19 | Disposition: A | Discharge status: Home / Self Care | Payer: Medicare Other | Source: Ambulatory Visit | Attending: Cardiology | Admitting: Cardiology

## 2016-11-19 VITALS — BP 161/72 | HR 62 | Wt 173.8 lb

## 2016-11-19 DIAGNOSIS — Z87891 Personal history of nicotine dependence: Secondary | ICD-10-CM | POA: Insufficient documentation

## 2016-11-19 DIAGNOSIS — I2510 Atherosclerotic heart disease of native coronary artery without angina pectoris: Secondary | ICD-10-CM | POA: Diagnosis not present

## 2016-11-19 DIAGNOSIS — I2582 Chronic total occlusion of coronary artery: Secondary | ICD-10-CM | POA: Insufficient documentation

## 2016-11-19 DIAGNOSIS — Z7982 Long term (current) use of aspirin: Secondary | ICD-10-CM | POA: Insufficient documentation

## 2016-11-19 DIAGNOSIS — E785 Hyperlipidemia, unspecified: Secondary | ICD-10-CM | POA: Diagnosis not present

## 2016-11-19 DIAGNOSIS — Z8249 Family history of ischemic heart disease and other diseases of the circulatory system: Secondary | ICD-10-CM | POA: Insufficient documentation

## 2016-11-19 DIAGNOSIS — I350 Nonrheumatic aortic (valve) stenosis: Secondary | ICD-10-CM | POA: Insufficient documentation

## 2016-11-19 DIAGNOSIS — I10 Essential (primary) hypertension: Secondary | ICD-10-CM | POA: Diagnosis not present

## 2016-11-19 DIAGNOSIS — K750 Abscess of liver: Secondary | ICD-10-CM | POA: Insufficient documentation

## 2016-11-19 DIAGNOSIS — I714 Abdominal aortic aneurysm, without rupture: Secondary | ICD-10-CM | POA: Insufficient documentation

## 2016-11-19 DIAGNOSIS — D696 Thrombocytopenia, unspecified: Secondary | ICD-10-CM | POA: Diagnosis not present

## 2016-11-19 DIAGNOSIS — Z8673 Personal history of transient ischemic attack (TIA), and cerebral infarction without residual deficits: Secondary | ICD-10-CM | POA: Diagnosis not present

## 2016-11-19 DIAGNOSIS — K219 Gastro-esophageal reflux disease without esophagitis: Secondary | ICD-10-CM | POA: Insufficient documentation

## 2016-11-19 DIAGNOSIS — Z79899 Other long term (current) drug therapy: Secondary | ICD-10-CM | POA: Diagnosis not present

## 2016-11-19 DIAGNOSIS — R0989 Other specified symptoms and signs involving the circulatory and respiratory systems: Secondary | ICD-10-CM | POA: Diagnosis not present

## 2016-11-19 DIAGNOSIS — Z8546 Personal history of malignant neoplasm of prostate: Secondary | ICD-10-CM | POA: Insufficient documentation

## 2016-11-19 DIAGNOSIS — Z951 Presence of aortocoronary bypass graft: Secondary | ICD-10-CM | POA: Insufficient documentation

## 2016-11-19 DIAGNOSIS — M791 Myalgia, unspecified site: Secondary | ICD-10-CM | POA: Diagnosis not present

## 2016-11-19 LAB — CBC
HCT: 42.8 % (ref 39.0–52.0)
HEMOGLOBIN: 15 g/dL (ref 13.0–17.0)
MCH: 30.3 pg (ref 26.0–34.0)
MCHC: 35 g/dL (ref 30.0–36.0)
MCV: 86.5 fL (ref 78.0–100.0)
Platelets: 107 10*3/uL — ABNORMAL LOW (ref 150–400)
RBC: 4.95 MIL/uL (ref 4.22–5.81)
RDW: 14.4 % (ref 11.5–15.5)
WBC: 8 10*3/uL (ref 4.0–10.5)

## 2016-11-19 MED ORDER — AMLODIPINE BESYLATE 10 MG PO TABS: 10.0000 mg | ORAL_TABLET | Freq: Every day | ORAL | 3 refills | Status: DC

## 2016-11-19 NOTE — Progress Notes (Signed)
Patient ID: Brandon [name], male   DOB: 12/03/1940, 76 y.o.   MRN: [id number] PCP: Elyn Aquas Cardiology: Dr. Aundra Dubin  76 yo with history of CAD, HTN, and hyperlipidemia presents for followup.  Patient had described some exertional chest pain and myoview showed a partially reversible inferior defect, so LHC was done in 7/10.  This showed that his proximal RCA was totally occluded.  There were very robust left to right collaterals.  There was also a 50% proximal LAD stenosis.  The patient was managed medically.    Most recent echo in 6/18 showed EF 55-60% with moderate AS.    Earlier in 2018, he developed a Strep intermedius liver abscess.  He had a hepatic drain placed and also had a right pleural effusion requiring diuresis.  Tube is now out.  It appears that simvastatin was stopped during one of his hospitalizations for liver abscess.   Returns today for followup of CAD and aortic stenosis. Worried about his BP, last readings have been higher than normal. BP's at home have been in the 160-170 range. Denies chest pain, palpitations, dizziness.  No exertional dyspnea. 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 Taking all medications. Drinking less than 2L a day. He has finished antibiotic treatment for his liver abscess.   Labs (12/10): HDL 28, LDL 70, K 4.3, creatinine 0.8 Labs (4/11): K 4, creatinine 0.8, LDL 53, HDL 26, LFTs normal Labs (12/11): LDL 74, HDL 27, K 4, creatinine 0.9, LFTs normal, TSH normal Labs (7/12): LDL 87, HDL 33 Labs (9/12): LDL 66, HDL 33, LFTs normal Labs (9/13): LDL 62, HDL 30, K 4.1, creatinine 0.8 Labs (4/14): LDL 88, HDL 27, K 4.4, creatinine 0.8 Labs (4/15): K 4.4, creatinine 0.76 Labs (4/16): LDL 69, HDL 32 Labs (11/16): K 4.3, creatinine 0.76, LDL 68, HDL 31 Labs (1/18): LDL 72 Labs (6/18): K 4.2, creatinine 0.6, LFTs normal, hgb 8.1.  Labs (9/18): K 4, creatinine 0.6, LDL 79, HDL 36  ECG: Personally reviewed, NSR, normal.   Allergies (verified):  1)  !  Celebrex  Past Medical History: 1. Hyperlipidemia: Myalgias with atorvastatin and Crestor. 2. Aortic stenosis.  Echocardiogram in August 2009 showed EF 60%, no regional wall motion abnormalities, mild LVH, moderate focal basal septal hypertrophy, and mild diastolic dysfunction.  There was a very mild aortic stenosis with partially fused left and right coronary cusps and some restricted motion of the aortic valve.  The mean gradient, however, across the aortic valve was only 8 mmHg.  There was also mild left atrial enlargement and normal RV size and function.  Minimal AS on LHC in 7/10.  Echo (7/14) with EF 60-65%, mild LVH, mild AS mean gradient 13 mmHg.  Echo (6/16) with EF 55-60%, mild AS with mean gradient 14 mmHg.  - Echo (6/18): EF 55-60%, mild LVH, moderate AS mean gradient 27 mmHg, mild AI.  3. Hypertension. 4. Gastroesophageal reflux disease: h/o esophageal stricture.  5. BPH. 6. Diverticulosis. 7. Smoked until 7/10. 8. CAD: LHC (7/10) showed totally occluded proximal RCA with very robust left to right collaterals, 50% proximal LAD stenosis, EF 65%.  Medical management.   9. Osteoarthritis 10. Colonic polyps 11. h/o TIA 12. 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  This manifested with slurred speech. 13. History of low back pain.  The patient is status post surgical back fusion. 14. History of cholecystectomy. 15. Cataract surgery 16. Prostate cancer: treated 56. Liver abscess: Strep intermedius, 2018.  18. AAA: Noted in  8/18 on CT abdomen, 4.3 cm.   Family History: Father alive w/ CAD & CABG at [facility] in 2009, had TAVR as well.  Mother died age 79 w/ Alzheimer's disease, hx of DJD 1 Sibling- Brother alive & well w/ DJD in his hips  Social History: Married- 83rd marriage, wife= Brandon [name] 2 children from 1st marriage, 4 step children Former smoker, 40+yrs of >1ppd, quit 7/10.  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    Current Outpatient Prescriptions  Medication Sig Dispense Refill  . amLODipine (NORVASC) 5 MG tablet TAKE 1 TABLET BY MOUTH DAILY 90 tablet 1  . aspirin 81 MG EC tablet Take 81 mg by mouth at bedtime.     . cholecalciferol (VITAMIN D) 1000 UNITS tablet Take 1,000 Units by mouth daily.     Marland Kitchen dicyclomine (BENTYL) 10 MG capsule Take 1 capsule (10 mg total) by mouth 3 (three) times daily as needed for spasms. 90 capsule 0  . iron polysaccharides (NU-IRON) 150 MG capsule Take 1 capsule (150 mg total) by mouth 2 (two) times daily.    . isosorbide mononitrate (IMDUR) 30 MG 24 hr tablet TAKE 1 TABLET BY MOUTH AT BEDTIME 90 tablet 1  . metoprolol tartrate (LOPRESSOR) 25 MG tablet Take 0.5 tablets (12.5 mg total) by mouth 2 (two) times daily. 30 tablet 5  . Multiple Vitamins-Minerals (MULTIVITAMIN PO) Take 1 tablet by mouth daily.    . Omega-3 Fatty Acids (FISH OIL PO) Take 2 capsules by mouth 2 (two) times daily.    Marland Kitchen omeprazole (PRILOSEC) 20 MG capsule TAKE 1 CAPSULE(20 MG) BY MOUTH DAILY 30 capsule 11  . oxyCODONE (OXY IR/ROXICODONE) 5 MG immediate release tablet Take 1 tablet (5 mg total) by mouth every 6 (six) hours as needed for moderate pain. 40 tablet 0  . saccharomyces boulardii (FLORASTOR) 250 MG capsule Take 1 tab twice daily 60 capsule 2  . senna-docusate (SENOKOT-S) 8.6-50 MG tablet Take 1 tablet by mouth 2 (two) times daily. (Patient taking differently: Take 1 tablet by mouth as needed. )    . simvastatin (ZOCOR) 20 MG tablet Take 1 tablet (20 mg total) by mouth at bedtime. 90 tablet 3   No current facility-administered medications for this encounter.     BP (!) 161/72   Pulse 62   Wt 173 lb 12 oz (78.8 kg)   SpO2 100%   BMI 27.21 kg/m  General:  Well developed, well nourished, in no acute distress. Neck:  Neck supple, JVP 7 cm. No masses, thyromegaly  or abnormal cervical nodes. Lungs:  CTAB Heart:  Non-displaced PMI, chest non-tender; regular rate and rhythm, S1, S2 without  rubs or gallops.  3/6 systolic crescendo-decrescendo murmur at RUSB with clear S2, radiates to neck.  Carotid upstroke normal, no bruit.  Pedals normal pulses. No edema, no varicosities. Abdomen:  Bowel sounds positive; abdomen soft and non-tender without masses, organomegaly, or hernias noted. No hepatosplenomegaly. Extremities:  No clubbing or cyanosis. Neurologic:  Alert and oriented x 3. Psych:  Normal affect.  Assessment/Plan:  1. AORTIC STENOSIS: Moderate by Echo in 07/2016. Mean gradient 27 mm Hg.  - Follow up echo in one year.  2. CAD: Stable. Denies chest pain - Continue simvastatin and ASA 81 daily.  3. HYPERLIPIDEMIA: Good lipids in 9/18.   - Continue simvastatin.  4. HYPERTENSION: BP elevated.  His benazepril was stopped in June 2018 due to soft BP's (also had ongoing infection at this time).  - Increase Norvasc to 10 mg daily. He will call us in 2 wks with BP readings.  5. Abdominal aortic aneurysm: 4.3 cm by CT in 09/2016 - Recommend follow up in one year, will get abdominal US 8/19.   6. Thrombocytopenia: Platelet low chronically, worse recently.  Could be related to recent antibiotic use.  - Will recheck CBC, consider hematology referral if plts lower.  7. Carotid Bruits: Set up carotid ultrasound. Hard to tell if he has bruits with loud AS murmur, however with history of CAD and smoking will do.  - He requests that Dr. Aundra Dubin see his wife next visit as well to establish care. Will have them both follow up in 6 months.   Arbutus Leas, NP-C 11/19/2016  Patient seen with NP, agree with the above note.  He is hypertensive, benazepril was stopped due to low BP when he had liver abscess.  I will increase his amlodipine to 10 mg daily.  He will call in 2 wks with home BP readings.  If still high, add back ACEI.  He has moderate aortic stenosis, repeat echo  at a year.  He had an incidentally noted AAA, will get abdominal US in 8/19.   Followup in 6 months.   Loralie Champagne 11/19/2016

## 2016-11-19 NOTE — Patient Instructions (Signed)
Increase Norvasc to 10mg  daily.  Routine lab work today. Will notify you of abnormal results  Your provider requests you have Carotid Dopplers after Christmas.  Follow up with Dr.McLean in 89months.  PLEASE RECORD YOUR BLOOD PRESSURE READINGS FOR 2 WEEKS AND CALL OUR OFFICE AT [phone] TO REPORT FINDINGS.

## 2016-11-21 ENCOUNTER — Telehealth (HOSPITAL_COMMUNITY): Payer: Self-pay | Admitting: *Deleted

## 2016-11-21 DIAGNOSIS — I5022 Chronic systolic (congestive) heart failure: Secondary | ICD-10-CM

## 2016-11-21 NOTE — Telephone Encounter (Signed)
-----   Message from Larey Dresser, MD sent at 11/20/2016  4:44 PM EDT ----- Platelets lower.  Repeat CBC in 2 wks to see if plts are coming back up with longer time off antibiotics.  If not, will need hematology referral.

## 2016-11-29 ENCOUNTER — Ambulatory Visit: Payer: Medicare Other | Admitting: Infectious Diseases

## 2016-12-03 ENCOUNTER — Ambulatory Visit: Payer: Medicare Other | Admitting: Infectious Diseases

## 2016-12-03 ENCOUNTER — Other Ambulatory Visit: Payer: Self-pay | Admitting: Physician Assistant

## 2016-12-03 DIAGNOSIS — M150 Primary generalized (osteo)arthritis: Secondary | ICD-10-CM

## 2016-12-03 DIAGNOSIS — M159 Polyosteoarthritis, unspecified: Secondary | ICD-10-CM

## 2016-12-03 DIAGNOSIS — M8949 Other hypertrophic osteoarthropathy, multiple sites: Secondary | ICD-10-CM

## 2016-12-03 DIAGNOSIS — Z79899 Other long term (current) drug therapy: Secondary | ICD-10-CM

## 2016-12-03 NOTE — Telephone Encounter (Signed)
Requesting refill of oxyCODONE (OXY IR/ROXICODONE) 5 MG immediate release tablet

## 2016-12-03 NOTE — Telephone Encounter (Signed)
Indication for chronic opioid:Osteoarthritis Medication and dose: Oxycodone 5mg   # pills per month: #40 Last UDS date: None Pain contract signed (Y/N): Yes 08/31/16 Date narcotic database last reviewed (include red flags):   Please advise

## 2016-12-04 MED ORDER — OXYCODONE HCL 5 MG PO TABS: 5.0000 mg | ORAL_TABLET | Freq: Four times a day (QID) | ORAL | 0 refills | Status: DC | PRN

## 2016-12-04 NOTE — Addendum Note (Signed)
Addended by: Leonidas Romberg on: 12/04/2016 12:07 PM   Modules accepted: Orders

## 2016-12-04 NOTE — Telephone Encounter (Signed)
Advised patient his rx is ready for pick up at the front desk.He is agreeable with giving a UDS. Orders completed.  He states he has a Carotid US and Cardiology increased Amlodipine to 10 mg daily. Now his BP is running better

## 2016-12-04 NOTE — Telephone Encounter (Signed)
[facility] up-to-date.  Database reviewed today -- no red flags.  A copy was printed and to be scanned into chart. Rx refill printed. Patient will need to give UDS at pickup.

## 2016-12-05 ENCOUNTER — Ambulatory Visit (HOSPITAL_COMMUNITY)
Admission: RE | Admit: 2016-12-05 | Discharge: 2016-12-05 | Disposition: A | Discharge status: Home / Self Care | Payer: Medicare Other | Source: Ambulatory Visit | Attending: Cardiology | Admitting: Cardiology

## 2016-12-05 ENCOUNTER — Other Ambulatory Visit: Payer: Medicare Other

## 2016-12-05 ENCOUNTER — Telehealth: Payer: Self-pay | Admitting: Physician Assistant

## 2016-12-05 ENCOUNTER — Ambulatory Visit (HOSPITAL_COMMUNITY)
Admission: RE | Admit: 2016-12-05 | Discharge: 2016-12-05 | Disposition: A | Discharge status: Home / Self Care | Payer: Medicare Other | Source: Ambulatory Visit | Attending: Internal Medicine | Admitting: Internal Medicine

## 2016-12-05 DIAGNOSIS — I5022 Chronic systolic (congestive) heart failure: Secondary | ICD-10-CM | POA: Insufficient documentation

## 2016-12-05 DIAGNOSIS — Z79899 Other long term (current) drug therapy: Secondary | ICD-10-CM | POA: Diagnosis not present

## 2016-12-05 DIAGNOSIS — M159 Polyosteoarthritis, unspecified: Secondary | ICD-10-CM

## 2016-12-05 DIAGNOSIS — R0989 Other specified symptoms and signs involving the circulatory and respiratory systems: Secondary | ICD-10-CM | POA: Diagnosis present

## 2016-12-05 DIAGNOSIS — M150 Primary generalized (osteo)arthritis: Principal | ICD-10-CM

## 2016-12-05 DIAGNOSIS — M8949 Other hypertrophic osteoarthropathy, multiple sites: Secondary | ICD-10-CM

## 2016-12-05 LAB — CBC
HCT: 42.9 % (ref 39.0–52.0)
Hemoglobin: 14.4 g/dL (ref 13.0–17.0)
MCH: 29.2 pg (ref 26.0–34.0)
MCHC: 33.6 g/dL (ref 30.0–36.0)
MCV: 87 fL (ref 78.0–100.0)
PLATELETS: 122 10*3/uL — AB (ref 150–400)
RBC: 4.93 MIL/uL (ref 4.22–5.81)
RDW: 14.1 % (ref 11.5–15.5)
WBC: 7.1 10*3/uL (ref 4.0–10.5)

## 2016-12-05 NOTE — Telephone Encounter (Signed)
Pt was asking if he was due to have his liver reck, please advsie

## 2016-12-05 NOTE — Telephone Encounter (Signed)
No. Will check every 6 months over next year.

## 2016-12-13 LAB — PAIN MGMT, PROFILE 8 W/CONF, U
6 Acetylmorphine: NEGATIVE ng/mL (ref <10)
ALCOHOL METABOLITES: POSITIVE ng/mL — AB (ref <500)
AMPHETAMINES: NEGATIVE ng/mL (ref <500)
BENZODIAZEPINES: NEGATIVE ng/mL (ref <100)
BUPRENORPHINE, URINE: NEGATIVE ng/mL (ref <5)
Cocaine Metabolite: NEGATIVE ng/mL (ref <150)
Creatinine: 79.5 mg/dL
ETHYL GLUCURONIDE (ETG): 735 ng/mL — AB (ref <500)
ETHYL SULFATE (ETS): 351 ng/mL — AB (ref <100)
MARIJUANA METABOLITE: NEGATIVE ng/mL (ref <20)
MDMA: NEGATIVE ng/mL (ref <500)
NOROXYCODONE: 615 ng/mL — AB (ref <50)
OXYCODONE: 429 ng/mL — AB (ref <50)
OXYCODONE: POSITIVE ng/mL — AB (ref <100)
OXYMORPHONE: 718 ng/mL — AB (ref <50)
Opiates: NEGATIVE ng/mL (ref <100)
Oxidant: NEGATIVE ug/mL (ref <200)
pH: 7.69 (ref 4.5–9.0)

## 2017-01-07 ENCOUNTER — Other Ambulatory Visit: Payer: Self-pay | Admitting: Physician Assistant

## 2017-01-07 DIAGNOSIS — M159 Polyosteoarthritis, unspecified: Secondary | ICD-10-CM

## 2017-01-07 DIAGNOSIS — M8949 Other hypertrophic osteoarthropathy, multiple sites: Secondary | ICD-10-CM

## 2017-01-07 DIAGNOSIS — M150 Primary generalized (osteo)arthritis: Principal | ICD-10-CM

## 2017-01-07 NOTE — Telephone Encounter (Signed)
Copied from [place] [id number]. Topic: Quick Communication - See Telephone Encounter >> Jan 07, 2017 10:20 AM Percell Belt A wrote: CRM for notification. See Telephone encounter for: pt  wife called in and said that pt needs refill on oxyCODONE (OXY IR/ROXICODONE) 5 MG immediate release tablet [[id number]]  Please call when ready to pick up    01/07/17.

## 2017-01-07 NOTE — Telephone Encounter (Signed)
Indication for chronic opioid: Osteoarthritis of multiple joints Medication and dose: Oxycodone 5mg  # pills per month: #40 Last UDS date: 12/05/16 Pain contract signed (Y/N): Yes on 08/31/16 Date narcotic database last reviewed (include red flags): 12/04/16 no red flags Please advise

## 2017-01-08 MED ORDER — OXYCODONE HCL 5 MG PO TABS: 5.0000 mg | ORAL_TABLET | Freq: Four times a day (QID) | ORAL | 0 refills | Status: DC | PRN

## 2017-01-08 NOTE — Telephone Encounter (Signed)
Advised patient that rx is ready at the front desk.

## 2017-02-11 ENCOUNTER — Telehealth: Payer: Self-pay | Admitting: Physician Assistant

## 2017-02-11 NOTE — Telephone Encounter (Signed)
Requesting refill of Oxycodone  LOV 09/19/16 with Daun Peacock

## 2017-02-11 NOTE — Telephone Encounter (Signed)
Copied from [place]. Topic: Quick Communication - See Telephone Encounter >> Feb 11, 2017  9:27 AM Bea Graff, NT wrote: CRM for notification. See Telephone encounter for: Pt requesting a refill of oxycodone. Please call pt to pick up.   02/11/17.

## 2017-02-12 NOTE — Telephone Encounter (Signed)
Patient will have to be seen every 3 months to receive chronic pain medication. He is due for appointment before further refills can be given.

## 2017-02-12 NOTE — Telephone Encounter (Signed)
LM for patient to call and schedule follow-up  With Brandon [name] so that he can get his medications filled.    CRM created so that [facility] could schedule appt with patient.

## 2017-02-13 ENCOUNTER — Ambulatory Visit: Payer: Medicare Other | Admitting: Physician Assistant

## 2017-02-13 ENCOUNTER — Encounter: Payer: Self-pay | Admitting: Physician Assistant

## 2017-02-13 VITALS — BP 130/70 | HR 73 | Temp 98.2°F | Resp 14 | Ht 67.0 in | Wt 180.0 lb

## 2017-02-13 DIAGNOSIS — M8949 Other hypertrophic osteoarthropathy, multiple sites: Secondary | ICD-10-CM

## 2017-02-13 DIAGNOSIS — D696 Thrombocytopenia, unspecified: Secondary | ICD-10-CM

## 2017-02-13 DIAGNOSIS — M159 Polyosteoarthritis, unspecified: Secondary | ICD-10-CM

## 2017-02-13 DIAGNOSIS — M150 Primary generalized (osteo)arthritis: Secondary | ICD-10-CM

## 2017-02-13 DIAGNOSIS — G8929 Other chronic pain: Secondary | ICD-10-CM

## 2017-02-13 LAB — COMPREHENSIVE METABOLIC PANEL
ALK PHOS: 152 U/L — AB (ref 39–117)
ALT: 53 U/L (ref 0–53)
AST: 30 U/L (ref 0–37)
Albumin: 4.4 g/dL (ref 3.5–5.2)
BILIRUBIN TOTAL: 0.8 mg/dL (ref 0.2–1.2)
BUN: 9 mg/dL (ref 6–23)
CO2: 30 mEq/L (ref 19–32)
CREATININE: 0.67 mg/dL (ref 0.40–1.50)
Calcium: 9.2 mg/dL (ref 8.4–10.5)
Chloride: 103 mEq/L (ref 96–112)
GFR: 123.12 mL/min (ref >60.00)
GLUCOSE: 103 mg/dL — AB (ref 70–99)
Potassium: 3.9 mEq/L (ref 3.5–5.1)
Sodium: 141 mEq/L (ref 135–145)
Total Protein: 7 g/dL (ref 6.0–8.3)

## 2017-02-13 LAB — CBC WITH DIFFERENTIAL/PLATELET
BASOS ABS: 0 10*3/uL (ref 0.0–0.1)
Basophils Relative: 0.4 % (ref 0.0–3.0)
EOS ABS: 0.2 10*3/uL (ref 0.0–0.7)
Eosinophils Relative: 2.5 % (ref 0.0–5.0)
HEMATOCRIT: 47.1 % (ref 39.0–52.0)
Hemoglobin: 16.1 g/dL (ref 13.0–17.0)
LYMPHS PCT: 25.6 % (ref 12.0–46.0)
Lymphs Abs: 1.9 10*3/uL (ref 0.7–4.0)
MCHC: 34.1 g/dL (ref 30.0–36.0)
MCV: 89.3 fl (ref 78.0–100.0)
Monocytes Absolute: 0.5 10*3/uL (ref 0.1–1.0)
Monocytes Relative: 6.2 % (ref 3.0–12.0)
NEUTROS ABS: 4.9 10*3/uL (ref 1.4–7.7)
Neutrophils Relative %: 65.3 % (ref 43.0–77.0)
PLATELETS: 128 10*3/uL — AB (ref 150.0–400.0)
RBC: 5.28 Mil/uL (ref 4.22–5.81)
RDW: 13.3 % (ref 11.5–15.5)
WBC: 7.5 10*3/uL (ref 4.0–10.5)

## 2017-02-13 MED ORDER — OXYCODONE HCL 5 MG PO TABS: 5.0000 mg | ORAL_TABLET | Freq: Four times a day (QID) | ORAL | 0 refills | Status: DC | PRN

## 2017-02-13 NOTE — Progress Notes (Signed)
Patient presents to clinic today for chronic pain management.  Chart reviewed and updated  Today 02/13/2017  Indication for chronic opioid: Osteoarthritis of Multiple Joints Medication and dose: Oxycodone IR 5 mg # pills per month: 40 Last UDS date: 12/05/2016 - low risk Pain contract signed (date): 08/2016 Date narcotic database last reviewed (include red flags): 02/13/2017. No red flags. A copy to be scanned into EMR.  Pain Inventory (1-10 worse): Average Pain 3/10 with medication. 7-9/10 without medication. Worsens throughout the day. Pain Right Now -- Increased. Pain of hips and knees is worsening. My pain is throbbing and constant. (character i.e. sharp, stabbing, dull, constant etc)  Pain is worse with: prolonged immobilization or heavy activity Relief from Meds: Takes care of pain each morning but afternoon he is feeling very rough. Is only taking once daily each morning.  In the last 24 hours, has pain interfered with the following (1-10 greatest interference) ? General activity  - 5 Relation with others - 1 Enjoyment of life - 5 What TIME of day is your pain at its worst? night       Sleep (in general) - doing very well.   Mobility/Function: Assistance device: none How many minutes can you walk? 30 minutes but painful Ability to climb steps?  present but does very slowly Do you drive? Yes Disabled (date): No  Neuro/Psych Sx: (bladder, bowel, weakness, dizziness, depression etc) None Prior Studies: See EMR Physicians involved in your care: Any changes since last visit?  Has followed scheduled with Orthopedics on 02/19/2017. Unsure of provider name.  Past Medical History:  Diagnosis Date  . Aortic stenosis    mild echocardiogram 8/09 EF 60%, showed no regional wall motion abnml, mild LVH, mod focal basal septal hypertrophy and mild dyastolic dysfunction. 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 mean gradient across aortic valve  was 8 mmHG. also mild L atrial enlargement and normal RV size and function. minimal AS on LHC in 7/10.   . Arthritis    "all over" (07/19/2016)  . BPH (benign prostatic hypertrophy)   . CAD (coronary artery disease)    LCH (7/10) totally occluded proximal RCA with very robuse L to R collaterals, 50% proximal LAD stenosis, EF 65%, medical management.    . Chicken pox   . Chronic lower back pain    s/p surgical fusion  . Diverticulosis   . Esophageal stricture   . GERD (gastroesophageal reflux disease)   . Heart murmur   . Hepatitis B 1984  . Hiatal hernia   . HLD (hyperlipidemia)   . HTN (hypertension)   . 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 Osteoarthritis   . TIA (transient ischemic attack) 1990s   hx  . Tubular adenoma of colon 2009    Current Outpatient Medications on File Prior to Visit  Medication Sig Dispense Refill  . amLODipine (NORVASC) 10 MG tablet Take 1 tablet (10 mg total) by mouth daily. 30 tablet 3  . aspirin 81 MG EC tablet Take 81 mg by mouth at bedtime.     . cholecalciferol (VITAMIN D) 1000 UNITS tablet Take 1,000 Units by mouth daily.     . iron polysaccharides (NU-IRON) 150 MG capsule Take 1 capsule (150 mg total) by mouth 2 (two) times daily.    . isosorbide mononitrate (IMDUR) 30 MG 24 hr tablet TAKE 1 TABLET BY MOUTH AT BEDTIME 90 tablet 1  . metoprolol tartrate (LOPRESSOR) 25 MG tablet Take 0.5 tablets (  12.5 mg total) by mouth 2 (two) times daily. 30 tablet 5  . Multiple Vitamins-Minerals (MULTIVITAMIN PO) Take 1 tablet by mouth daily.    . Omega-3 Fatty Acids (FISH OIL PO) Take 2 capsules by mouth 2 (two) times daily.    Marland Kitchen omeprazole (PRILOSEC) 20 MG capsule TAKE 1 CAPSULE(20 MG) BY MOUTH DAILY 30 capsule 11  . oxyCODONE (OXY IR/ROXICODONE) 5 MG immediate release tablet Take 1 tablet (5 mg total) by mouth every 6 (six) hours as needed for moderate pain. 40 tablet 0  . simvastatin (ZOCOR) 20 MG tablet Take 1 tablet (20 mg total) by mouth at bedtime. 90 tablet 3    No current facility-administered medications on file prior to visit.     Allergies  Allergen Reactions  . Celebrex [Celecoxib] Hives and Itching    Family History  Problem Relation Age of Onset  . Heart disease Father 54       Living  . Coronary artery disease Father        CABG  . Alzheimer's disease Mother 11       Deceased  . Arthritis/Rheumatoid Mother   . Stomach cancer Maternal Uncle   . Brain cancer Maternal Aunt        x2  . Arthritis Brother        DJD  . Obesity Daughter        Had Bypass Sx    Social History   Socioeconomic History  . Marital status: Married    Spouse name: etta  . Number of children: 2  . Years of education: None  . Highest education level: None  Social Needs  . Financial resource strain: None  . Food insecurity - worry: None  . Food insecurity - inability: None  . Transportation needs - medical: None  . Transportation needs - non-medical: None  Occupational History  . Occupation: retired    Fish farm manager: RETIRED    Comment: disabled due to back problems  Tobacco Use  . Smoking status: Former Smoker    Packs/day: 1.50    Years: 50.00    Pack years: 75.00    Types: Cigarettes    Last attempt to quit: 06/2015    Years since quitting: 1.6  . Smokeless tobacco: Never Used  . Tobacco comment: smoked less than 1 ppd for 40+ years; Using Vapor Cig  Substance and Sexual Activity  . Alcohol use: No    Alcohol/week: 0.0 oz    Comment: 07/19/2016 "might have a few drinks/year"  . Drug use: No  . Sexual activity: No  Other Topics Concern  . None  Social History Narrative   Married (3rd), [place]. 2 children from 1st marriage, 4 step children.    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       Cell # [id number]   Review of Systems - See HPI.  All other ROS are negative.  BP 130/70   Pulse 73   Temp 98.2 F (36.8 C) (Oral)   Resp 14   Ht '5\' 7"'   (1.702 m)   Wt 180 lb (81.6 kg)   SpO2 98%   BMI 28.19 kg/m   Physical Exam  Constitutional: He is oriented to person, place, and time and well-developed, well-nourished, and in no distress.  HENT:  Head: Normocephalic and atraumatic.  Eyes: Conjunctivae are normal.  Cardiovascular: Normal rate, regular rhythm, normal heart sounds and intact  distal pulses.  Pulmonary/Chest: Effort normal and breath sounds normal. No respiratory distress. He has no wheezes. He has no rales. He exhibits no tenderness.  Neurological: He is alert and oriented to person, place, and time.  Skin: Skin is warm and dry. No rash noted.  Psychiatric: Affect normal.  Vitals reviewed.  Recent Results (from the past 2160 hour(s))  CBC     Status: Abnormal   Collection Time: 11/19/16  1:00 PM  Result Value Ref Range   WBC 8.0 4.0 - 10.5 K/uL   RBC 4.95 4.22 - 5.81 MIL/uL   Hemoglobin 15.0 13.0 - 17.0 g/dL   HCT 42.8 39.0 - 52.0 %   MCV 86.5 78.0 - 100.0 fL   MCH 30.3 26.0 - 34.0 pg   MCHC 35.0 30.0 - 36.0 g/dL   RDW 14.4 11.5 - 15.5 %   Platelets 107 (L) 150 - 400 K/uL    Comment: REPEATED TO VERIFY SPECIMEN CHECKED FOR CLOTS PLATELET COUNT CONFIRMED BY SMEAR   CBC     Status: Abnormal   Collection Time: 12/05/16 11:02 AM  Result Value Ref Range   WBC 7.1 4.0 - 10.5 K/uL   RBC 4.93 4.22 - 5.81 MIL/uL   Hemoglobin 14.4 13.0 - 17.0 g/dL   HCT 42.9 39.0 - 52.0 %   MCV 87.0 78.0 - 100.0 fL   MCH 29.2 26.0 - 34.0 pg   MCHC 33.6 30.0 - 36.0 g/dL   RDW 14.1 11.5 - 15.5 %   Platelets 122 (L) 150 - 400 K/uL  Pain Mgmt, Profile 8 w/Conf, U     Status: Abnormal   Collection Time: 12/05/16  2:05 PM  Result Value Ref Range   Creatinine 79.5 > or = 20. mg/dL   pH 7.69 4.5 - 9.0   Oxidant NEGATIVE <200 mcg/mL   Amphetamines NEGATIVE <500 ng/mL   medMATCH Amphetamines CONSISTENT    Benzodiazepines NEGATIVE <100 ng/mL   medMATCH Benzodiazepines CONSISTENT    Marijuana Metabolite NEGATIVE <20 ng/mL    medMATCH Marijuana Metab CONSISTENT    Cocaine Metabolite NEGATIVE <150 ng/mL   medMATCH Cocaine Metab CONSISTENT    Opiates NEGATIVE <100 ng/mL   medMATCH Opiates CONSISTENT    Oxycodone POSITIVE (A) <100 ng/mL   Noroxycodone 615 (H) <50 ng/mL    Comment: See Note 1   medMATCH Noroxycodone INCONSISTENT     Comment: See Note 2   Oxycodone 429 (H) <50 ng/mL    Comment: See Note 1   medMATCH Oxycodone INCONSISTENT    Oxymorphone 718 (H) <50 ng/mL    Comment: See Note 1   medMATCH Oxymorphone INCONSISTENT     Comment: See Note 3   Buprenorphine, Urine NEGATIVE <5 ng/mL   medMATCH Buprenorphine CONSISTENT    MDMA NEGATIVE <500 ng/mL   [facility] MDMA CONSISTENT    Alcohol Metabolites POSITIVE (A) <500 ng/mL   Ethyl Glucuronide (ETG) 735 (H) <500 ng/mL    Comment: See Note 1   medMATCH ETG INCONSISTENT    Ethyl Sulfate (ETS) 351 (H) <100 ng/mL    Comment: See Note 1   medMATCH ETS INCONSISTENT     Comment: Note 1 . This test was developed and its analytical performance  characteristics have been determined by [employer]. It has not been cleared or approved by the FDA. This assay has been validated pursuant to the CLIA  regulations and is used for clinical purposes. . Note 2 Noroxycodone is a metabolite of Oxycodone. . Note 3  Oxymorphone is a metabolite of oxycodone as well as  a prescribed drug.    6 Acetylmorphine NEGATIVE <10 ng/mL   medMATCH 6 Acetylmorphine CONSISTENT     Comment: This drug testing is for medical treatment only.   Analysis was performed as non-forensic testing and  these results should be used only by healthcare  providers to render diagnosis or treatment, or to  monitor progress of medical conditions. Hazel Sams comments are:  - present when drug test results may be the result of     metabolism of one or more drugs or when results are     inconsistent with prescribed medication(s) listed.  - may be blank when drug results are consistent  with     prescribed medication(s) listed. . For assistance with interpreting these drug results,  please contact a [employer] Toxicology  Specialist: [phone] [place] [phone]), M-F,  8am-6pm EST. This drug testing is for medical treatment only.   Analysis was performed as non-forensic testing and  these results should be used only by healthcare  providers to render diagnosis or treatment, or to  monitor progress of medical conditions. Hazel Sams comments are:  - present when  drug test results may be the result of     metabolism of one or more drugs or when results are     inconsistent with prescribed medication(s) listed.  - may be blank when drug results are consistent with     prescribed medication(s) listed. . For assistance with interpreting these drug results,  please contact a [employer] Toxicology  Specialist: [phone] [place] [phone]), M-F,  8am-6pm EST. This drug testing is for medical treatment only.   Analysis was performed as non-forensic testing and  these results should be used only by healthcare  providers to render diagnosis or treatment, or to  monitor progress of medical conditions. Hazel Sams comments are:  - present when drug test results may be the result of     metabolism of one or more drugs or when results are     inconsistent with prescribed medication(s) listed.  - may be blank when drug results are consistent with     prescribed medication(s) listed. . For assistance with interpreting these d rug results,  please contact a [employer] Toxicology  Specialist: [phone] [place] [phone]), M-F,  8am-6pm EST. This drug testing is for medical treatment only.   Analysis was performed as non-forensic testing and  these results should be used only by healthcare  providers to render diagnosis or treatment, or to  monitor progress of medical conditions. Hazel Sams comments are:  - present when drug test results  may be the result of     metabolism of one or more drugs or when results are     inconsistent with prescribed medication(s) listed.  - may be blank when drug results are consistent with     prescribed medication(s) listed. . For assistance with interpreting these drug results,  please contact a [employer] Toxicology  Specialist: [phone] [place] [phone]), M-F,  8am-6pm EST. This drug testing is for medical treatment only.   Analysis was performed as non-forensic testing and  these results should be used only by healthcare  provi ders to render diagnosis or treatment, or to  monitor progress of medical conditions. Hazel Sams comments are:  - present when drug test results may be the result of     metabolism of one or more drugs or when results are  inconsistent with prescribed medication(s) listed.  - may be blank when drug results are consistent with     prescribed medication(s) listed. . For assistance with interpreting these drug results,  please contact a [employer] Toxicology  Specialist: [phone] [place] [phone]), M-F,  8am-6pm EST.     Assessment/Plan: 1. Thrombocytopenia ([place]) Repeat labs today. - CBC w/Diff  2. Primary osteoarthritis involving multiple joints CMP today. CSC up-to-date. Medication refilled. Follow-up with Ortho next week as scheduled. - Comp Met (CMET) - oxyCODONE (OXY IR/ROXICODONE) 5 MG immediate release tablet; Take 1 tablet (5 mg total) by mouth every 6 (six) hours as needed for moderate pain.  Dispense: 60 tablet; Refill: 0  3. Encounter for chronic pain management CMP today. CSC up-to-date. Medication refilled. Follow-up with Ortho next week as scheduled.   Leeanne Rio, PA-C

## 2017-02-13 NOTE — Patient Instructions (Signed)
Please follow-up with Orthopedist next week as scheduled. Please have them send me copies of their notes.  Please continue medications as directed for now. I am increasing the quantity so you do not feel you have to spread them out.   Start a daily 250-500 mg Turmeric supplement.  Please go to the lab today for blood work.  I will call you with your results. We will alter treatment regimen(s) if indicated by your results.

## 2017-02-14 ENCOUNTER — Other Ambulatory Visit (INDEPENDENT_AMBULATORY_CARE_PROVIDER_SITE_OTHER): Payer: Medicare Other

## 2017-02-14 ENCOUNTER — Telehealth: Payer: Self-pay | Admitting: Physician Assistant

## 2017-02-14 DIAGNOSIS — R748 Abnormal levels of other serum enzymes: Secondary | ICD-10-CM | POA: Diagnosis not present

## 2017-02-14 LAB — GAMMA GT: GGT: 138 U/L — AB (ref 7–51)

## 2017-02-14 NOTE — Telephone Encounter (Signed)
Copied from [place] [id number]. Topic: Inquiry >> Feb 14, 2017  5:01 PM [name] wrote: Reason for CRM: Patient called get his lab result that was done today. Patient stated the best number to reach him is [phone].

## 2017-02-15 ENCOUNTER — Other Ambulatory Visit: Payer: Self-pay | Admitting: Physician Assistant

## 2017-02-15 DIAGNOSIS — K750 Abscess of liver: Secondary | ICD-10-CM

## 2017-02-15 NOTE — Telephone Encounter (Signed)
Patient advised of all lab results. CT abdomen ordered. He is agreeable

## 2017-02-19 ENCOUNTER — Encounter: Payer: Self-pay | Admitting: Physician Assistant

## 2017-02-19 ENCOUNTER — Ambulatory Visit: Payer: Medicare Other | Admitting: Physician Assistant

## 2017-02-19 VITALS — BP 120/70 | HR 73 | Temp 98.1°F | Resp 14 | Ht 67.0 in | Wt 180.0 lb

## 2017-02-19 DIAGNOSIS — B029 Zoster without complications: Secondary | ICD-10-CM | POA: Diagnosis not present

## 2017-02-19 DIAGNOSIS — M7061 Trochanteric bursitis, right hip: Secondary | ICD-10-CM | POA: Diagnosis not present

## 2017-02-19 DIAGNOSIS — M25561 Pain in right knee: Secondary | ICD-10-CM | POA: Diagnosis not present

## 2017-02-19 MED ORDER — VALACYCLOVIR HCL 1 G PO TABS: 1000.0000 mg | ORAL_TABLET | Freq: Three times a day (TID) | ORAL | 0 refills | Status: DC

## 2017-02-19 NOTE — Patient Instructions (Signed)
Please take the Valtrex as directed. Apply topical OTC Lidocaine to the area to help numb. Keep the area covered. Avoid contact with the elderly, infants or pregnant women. This is contagious until all the lesions are crusted over.  Ok to restart the Turmeric.

## 2017-02-19 NOTE — Progress Notes (Signed)
Patient presents to clinic today c/o rash of left biceps region x 4 days. Began with a few lesions and now forming a cluster. Rash is painful and burning. Sometimes itching.  Denies rash elsewhere. Denies fever, chills. Notes surrounding hyperesthesia and allodynia. Denies recent travel or sick contact. Has never had his shingles vaccination.  Past Medical History:  Diagnosis Date  . Aortic stenosis    mild echocardiogram 8/09 EF 60%, showed no regional wall motion abnml, mild LVH, mod focal basal septal hypertrophy and mild dyastolic dysfunction. 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 mean gradient across aortic valve was 8 mmHG. also mild L atrial enlargement and normal RV size and function. minimal AS on LHC in 7/10.   . Arthritis    "all over" (07/19/2016)  . BPH (benign prostatic hypertrophy)   . CAD (coronary artery disease)    LCH (7/10) totally occluded proximal RCA with very robuse L to R collaterals, 50% proximal LAD stenosis, EF 65%, medical management.    . Chicken pox   . Chronic lower back pain    s/p surgical fusion  . Diverticulosis   . Esophageal stricture   . GERD (gastroesophageal reflux disease)   . Heart murmur   . Hepatitis B 1984  . Hiatal hernia   . HLD (hyperlipidemia)   . HTN (hypertension)   . 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 Osteoarthritis   . TIA (transient ischemic attack) 1990s   hx  . Tubular adenoma of colon 2009    Current Outpatient Medications on File Prior to Visit  Medication Sig Dispense Refill  . amLODipine (NORVASC) 10 MG tablet Take 1 tablet (10 mg total) by mouth daily. 30 tablet 3  . aspirin 81 MG EC tablet Take 81 mg by mouth at bedtime.     . cholecalciferol (VITAMIN D) 1000 UNITS tablet Take 1,000 Units by mouth daily.     . iron polysaccharides (NU-IRON) 150 MG capsule Take 1 capsule (150 mg total) by mouth 2 (two) times daily.    . isosorbide mononitrate (IMDUR) 30 MG 24 hr tablet TAKE 1  TABLET BY MOUTH AT BEDTIME 90 tablet 1  . metoprolol tartrate (LOPRESSOR) 25 MG tablet Take 0.5 tablets (12.5 mg total) by mouth 2 (two) times daily. 30 tablet 5  . Multiple Vitamins-Minerals (MULTIVITAMIN PO) Take 1 tablet by mouth daily.    . Omega-3 Fatty Acids (FISH OIL PO) Take 2 capsules by mouth 2 (two) times daily.    Marland Kitchen omeprazole (PRILOSEC) 20 MG capsule TAKE 1 CAPSULE(20 MG) BY MOUTH DAILY 30 capsule 11  . oxyCODONE (OXY IR/ROXICODONE) 5 MG immediate release tablet Take 1 tablet (5 mg total) by mouth every 6 (six) hours as needed for moderate pain. 60 tablet 0  . simvastatin (ZOCOR) 20 MG tablet Take 1 tablet (20 mg total) by mouth at bedtime. 90 tablet 3  . Turmeric 400 MG CAPS Take 1 tablet by mouth daily.     No current facility-administered medications on file prior to visit.     Allergies  Allergen Reactions  . Celebrex [Celecoxib] Hives and Itching    Family History  Problem Relation Age of Onset  . Heart disease Father 46       Living  . Coronary artery disease Father        CABG  . Alzheimer's disease Mother 88       Deceased  . Arthritis/Rheumatoid Mother   . Stomach cancer Maternal  Uncle   . Brain cancer Maternal Aunt        x2  . Arthritis Brother        DJD  . Obesity Daughter        Had Bypass Sx    Social History   Socioeconomic History  . Marital status: Married    Spouse name: etta  . Number of children: 2  . Years of education: None  . Highest education level: None  Social Needs  . Financial resource strain: None  . Food insecurity - worry: None  . Food insecurity - inability: None  . Transportation needs - medical: None  . Transportation needs - non-medical: None  Occupational History  . Occupation: retired    Fish farm manager: RETIRED    Comment: disabled due to back problems  Tobacco Use  . Smoking status: Former Smoker    Packs/day: 1.50    Years: 50.00    Pack years: 75.00    Types: Cigarettes    Last attempt to quit: 06/2015     Years since quitting: 1.7  . Smokeless tobacco: Never Used  . Tobacco comment: smoked less than 1 ppd for 40+ years; Using Vapor Cig  Substance and Sexual Activity  . Alcohol use: No    Alcohol/week: 0.0 oz    Comment: 07/19/2016 "might have a few drinks/year"  . Drug use: No  . Sexual activity: No  Other Topics Concern  . None  Social History Narrative   Married (3rd), [place]. 2 children from 1st marriage, 4 step children.    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       Cell # [id number]   Review of Systems - See HPI.  All other ROS are negative.  BP 120/70   Pulse 73   Temp 98.1 F (36.7 C) (Oral)   Resp 14   Ht '5\' 7"'  (1.702 m)   Wt 180 lb (81.6 kg)   SpO2 97%   BMI 28.19 kg/m   Physical Exam  Constitutional: He is well-developed, well-nourished, and in no distress.  Cardiovascular: Normal rate, regular rhythm, normal heart sounds and intact distal pulses.  Pulmonary/Chest: Effort normal and breath sounds normal. No respiratory distress. He has no wheezes. He has no rales. He exhibits no tenderness.  Neurological: He is alert.  Skin:     Vitals reviewed.   Recent Results (from the past 2160 hour(s))  CBC     Status: Abnormal   Collection Time: 12/05/16 11:02 AM  Result Value Ref Range   WBC 7.1 4.0 - 10.5 K/uL   RBC 4.93 4.22 - 5.81 MIL/uL   Hemoglobin 14.4 13.0 - 17.0 g/dL   HCT 42.9 39.0 - 52.0 %   MCV 87.0 78.0 - 100.0 fL   MCH 29.2 26.0 - 34.0 pg   MCHC 33.6 30.0 - 36.0 g/dL   RDW 14.1 11.5 - 15.5 %   Platelets 122 (L) 150 - 400 K/uL  Pain Mgmt, Profile 8 w/Conf, U     Status: Abnormal   Collection Time: 12/05/16  2:05 PM  Result Value Ref Range   Creatinine 79.5 > or = 20. mg/dL   pH 7.69 4.5 - 9.0   Oxidant NEGATIVE <200 mcg/mL   Amphetamines NEGATIVE <500 ng/mL   medMATCH Amphetamines CONSISTENT    Benzodiazepines NEGATIVE <100 ng/mL   medMATCH Benzodiazepines  CONSISTENT    Marijuana Metabolite NEGATIVE <20  ng/mL   [facility] Marijuana Metab CONSISTENT    Cocaine Metabolite NEGATIVE <150 ng/mL   medMATCH Cocaine Metab CONSISTENT    Opiates NEGATIVE <100 ng/mL   medMATCH Opiates CONSISTENT    Oxycodone POSITIVE (A) <100 ng/mL   Noroxycodone 615 (H) <50 ng/mL    Comment: See Note 1   medMATCH Noroxycodone INCONSISTENT     Comment: See Note 2   Oxycodone 429 (H) <50 ng/mL    Comment: See Note 1   medMATCH Oxycodone INCONSISTENT    Oxymorphone 718 (H) <50 ng/mL    Comment: See Note 1   medMATCH Oxymorphone INCONSISTENT     Comment: See Note 3   Buprenorphine, Urine NEGATIVE <5 ng/mL   medMATCH Buprenorphine CONSISTENT    MDMA NEGATIVE <500 ng/mL   [facility] MDMA CONSISTENT    Alcohol Metabolites POSITIVE (A) <500 ng/mL   Ethyl Glucuronide (ETG) 735 (H) <500 ng/mL    Comment: See Note 1   medMATCH ETG INCONSISTENT    Ethyl Sulfate (ETS) 351 (H) <100 ng/mL    Comment: See Note 1   medMATCH ETS INCONSISTENT     Comment: Note 1 . This test was developed and its analytical performance  characteristics have been determined by [employer]. It has not been cleared or approved by the FDA. This assay has been validated pursuant to the CLIA  regulations and is used for clinical purposes. . Note 2 Noroxycodone is a metabolite of Oxycodone. . Note 3 Oxymorphone is a metabolite of oxycodone as well as  a prescribed drug.    6 Acetylmorphine NEGATIVE <10 ng/mL   medMATCH 6 Acetylmorphine CONSISTENT     Comment: This drug testing is for medical treatment only.   Analysis was performed as non-forensic testing and  these results should be used only by healthcare  providers to render diagnosis or treatment, or to  monitor progress of medical conditions. Hazel Sams comments are:  - present when drug test results may be the result of     metabolism of one or more drugs or when results are     inconsistent with prescribed medication(s)  listed.  - may be blank when drug results are consistent with     prescribed medication(s) listed. . For assistance with interpreting these drug results,  please contact a [employer] Toxicology  Specialist: [phone] [place] [phone]), M-F,  8am-6pm EST. This drug testing is for medical treatment only.   Analysis was performed as non-forensic testing and  these results should be used only by healthcare  providers to render diagnosis or treatment, or to  monitor progress of medical conditions. Hazel Sams comments are:  - present when  drug test results may be the result of     metabolism of one or more drugs or when results are     inconsistent with prescribed medication(s) listed.  - may be blank when drug results are consistent with     prescribed medication(s) listed. . For assistance with interpreting these drug results,  please contact a [employer] Toxicology  Specialist: [phone] [place] [phone]), M-F,  8am-6pm EST. This drug testing is for medical treatment only.   Analysis was performed as non-forensic testing and  these results should be used only by healthcare  providers to render diagnosis or treatment, or to  monitor progress of medical conditions. Hazel Sams comments are:  - present when drug test results may be the result of     metabolism of one or more drugs  or when results are     inconsistent with prescribed medication(s) listed.  - may be blank when drug results are consistent with     prescribed medication(s) listed. . For assistance with interpreting these d rug results,  please contact a [employer] Toxicology  Specialist: [phone] [place] [phone]), M-F,  8am-6pm EST. This drug testing is for medical treatment only.   Analysis was performed as non-forensic testing and  these results should be used only by healthcare  providers to render diagnosis or treatment, or to  monitor progress of medical  conditions. Hazel Sams comments are:  - present when drug test results may be the result of     metabolism of one or more drugs or when results are     inconsistent with prescribed medication(s) listed.  - may be blank when drug results are consistent with     prescribed medication(s) listed. . For assistance with interpreting these drug results,  please contact a [employer] Toxicology  Specialist: [phone] [place] [phone]), M-F,  8am-6pm EST. This drug testing is for medical treatment only.   Analysis was performed as non-forensic testing and  these results should be used only by healthcare  provi ders to render diagnosis or treatment, or to  monitor progress of medical conditions. Hazel Sams comments are:  - present when drug test results may be the result of     metabolism of one or more drugs or when results are     inconsistent with prescribed medication(s) listed.  - may be blank when drug results are consistent with     prescribed medication(s) listed. . For assistance with interpreting these drug results,  please contact a [employer] Toxicology  Specialist: [phone] [place] [phone]), M-F,  8am-6pm EST.   Comp Met (CMET)     Status: Abnormal   Collection Time: 02/13/17  2:13 PM  Result Value Ref Range   Sodium 141 135 - 145 mEq/L   Potassium 3.9 3.5 - 5.1 mEq/L   Chloride 103 96 - 112 mEq/L   CO2 30 19 - 32 mEq/L   Glucose, Bld 103 (H) 70 - 99 mg/dL   BUN 9 6 - 23 mg/dL   Creatinine, Ser 0.67 0.40 - 1.50 mg/dL   Total Bilirubin 0.8 0.2 - 1.2 mg/dL   Alkaline Phosphatase 152 (H) 39 - 117 U/L   AST 30 0 - 37 U/L   ALT 53 0 - 53 U/L   Total Protein 7.0 6.0 - 8.3 g/dL   Albumin 4.4 3.5 - 5.2 g/dL   Calcium 9.2 8.4 - 10.5 mg/dL   GFR 123.12 >60.00 mL/min  CBC w/Diff     Status: Abnormal   Collection Time: 02/13/17  2:13 PM  Result Value Ref Range   WBC 7.5 4.0 - 10.5 K/uL   RBC 5.28 4.22 - 5.81 Mil/uL   Hemoglobin 16.1 13.0 - 17.0  g/dL   HCT 47.1 39.0 - 52.0 %   MCV 89.3 78.0 - 100.0 fl   MCHC 34.1 30.0 - 36.0 g/dL   RDW 13.3 11.5 - 15.5 %   Platelets 128.0 (L) 150.0 - 400.0 K/uL   Neutrophils Relative % 65.3 43.0 - 77.0 %   Lymphocytes Relative 25.6 12.0 - 46.0 %   Monocytes Relative 6.2 3.0 - 12.0 %   Eosinophils Relative 2.5 0.0 - 5.0 %   Basophils Relative 0.4 0.0 - 3.0 %   Neutro Abs 4.9 1.4 - 7.7 K/uL   Lymphs Abs 1.9 0.7 -  4.0 K/uL   Monocytes Absolute 0.5 0.1 - 1.0 K/uL   Eosinophils Absolute 0.2 0.0 - 0.7 K/uL   Basophils Absolute 0.0 0.0 - 0.1 K/uL  Gamma GT     Status: Abnormal   Collection Time: 02/14/17  2:12 PM  Result Value Ref Range   GGT 138 (H) 7 - 51 U/L    Assessment/Plan: 1. Herpes zoster without complication Will start Valtrex. Discussed that is may not be as effective due to lesions being present for 3.5 days. Still will attempt in hopes of reducing risk for postherpetic neuralgia. Supportive measures and OTC medications reviewed.  - valACYclovir (VALTREX) 1000 MG tablet; Take 1 tablet (1,000 mg total) by mouth 3 (three) times daily.  Dispense: 21 tablet; Refill: 0   Leeanne Rio, PA-C

## 2017-02-26 ENCOUNTER — Ambulatory Visit
Admission: RE | Admit: 2017-02-26 | Discharge: 2017-02-26 | Disposition: A | Discharge status: Home / Self Care | Payer: Medicare Other | Source: Ambulatory Visit | Attending: Physician Assistant | Admitting: Physician Assistant

## 2017-02-26 DIAGNOSIS — K750 Abscess of liver: Secondary | ICD-10-CM

## 2017-02-26 DIAGNOSIS — R7989 Other specified abnormal findings of blood chemistry: Secondary | ICD-10-CM | POA: Diagnosis not present

## 2017-02-26 MED ORDER — IOPAMIDOL (ISOVUE-300) INJECTION 61%
100.0000 mL | Freq: Once | INTRAVENOUS | Status: AC | PRN
Start: 2017-02-26 — End: 2017-02-26
  Administered 2017-02-26: 100 mL via INTRAVENOUS

## 2017-02-27 ENCOUNTER — Other Ambulatory Visit: Payer: Self-pay | Admitting: Physician Assistant

## 2017-02-27 DIAGNOSIS — K750 Abscess of liver: Secondary | ICD-10-CM

## 2017-02-27 DIAGNOSIS — K760 Fatty (change of) liver, not elsewhere classified: Secondary | ICD-10-CM

## 2017-03-13 ENCOUNTER — Encounter: Payer: Self-pay | Admitting: Physician Assistant

## 2017-03-13 DIAGNOSIS — K760 Fatty (change of) liver, not elsewhere classified: Secondary | ICD-10-CM | POA: Diagnosis not present

## 2017-03-13 DIAGNOSIS — I81 Portal vein thrombosis: Secondary | ICD-10-CM | POA: Diagnosis not present

## 2017-03-15 ENCOUNTER — Other Ambulatory Visit: Payer: Self-pay | Admitting: Nurse Practitioner

## 2017-03-15 DIAGNOSIS — I81 Portal vein thrombosis: Secondary | ICD-10-CM

## 2017-03-18 ENCOUNTER — Other Ambulatory Visit: Payer: Self-pay | Admitting: Physician Assistant

## 2017-03-18 DIAGNOSIS — M159 Polyosteoarthritis, unspecified: Secondary | ICD-10-CM

## 2017-03-18 DIAGNOSIS — M150 Primary generalized (osteo)arthritis: Principal | ICD-10-CM

## 2017-03-18 DIAGNOSIS — M8949 Other hypertrophic osteoarthropathy, multiple sites: Secondary | ICD-10-CM

## 2017-03-18 NOTE — Telephone Encounter (Signed)
Copied from [place]. Topic: Quick Communication - Rx Refill/Question >> Mar 18, 2017  9:16 AM Margot Ables wrote: Medication: oxycodone - takes 1-2/day, has 6 left per pt wife Has the patient contacted their pharmacy? No. controlled Preferred Pharmacy (with phone number or street name): [facility] [place], [place] [phone] (Phone) [phone] (Fax)   Agent: Please be advised that RX refills may take up to 3 business days. We ask that you follow-up with your pharmacy.

## 2017-03-18 NOTE — Telephone Encounter (Signed)
Indication for chronic opioid: Osteoarthritis Medication and dose: Oxycodone XR 5 mg # pills per month: #60  Last UDS date: 12/05/2016 Pain contract signed (Y/N): Yes 08/31/16 Date narcotic database last reviewed (include red flags):   Please advise

## 2017-03-19 MED ORDER — OXYCODONE HCL 5 MG PO TABS: 5.0000 mg | ORAL_TABLET | Freq: Four times a day (QID) | ORAL | 0 refills | Status: DC | PRN

## 2017-03-19 NOTE — Telephone Encounter (Signed)
Database reviewed today 03/19/17. No red flags. Copy printed to be scanned into chart.

## 2017-03-28 ENCOUNTER — Ambulatory Visit
Admission: RE | Admit: 2017-03-28 | Discharge: 2017-03-28 | Disposition: A | Discharge status: Home / Self Care | Payer: Medicare Other | Source: Ambulatory Visit | Attending: Nurse Practitioner | Admitting: Nurse Practitioner

## 2017-03-28 DIAGNOSIS — K7689 Other specified diseases of liver: Secondary | ICD-10-CM | POA: Diagnosis not present

## 2017-03-28 DIAGNOSIS — I81 Portal vein thrombosis: Secondary | ICD-10-CM

## 2017-04-02 ENCOUNTER — Other Ambulatory Visit (HOSPITAL_COMMUNITY): Payer: Self-pay | Admitting: *Deleted

## 2017-04-02 MED ORDER — AMLODIPINE BESYLATE 10 MG PO TABS: 10.0000 mg | ORAL_TABLET | Freq: Every day | ORAL | 0 refills | Status: DC

## 2017-04-09 ENCOUNTER — Other Ambulatory Visit (HOSPITAL_COMMUNITY): Payer: Self-pay | Admitting: *Deleted

## 2017-04-09 MED ORDER — METOPROLOL TARTRATE 25 MG PO TABS: 12.5000 mg | ORAL_TABLET | Freq: Two times a day (BID) | ORAL | 5 refills | Status: DC

## 2017-04-19 ENCOUNTER — Other Ambulatory Visit: Payer: Self-pay | Admitting: Physician Assistant

## 2017-04-19 DIAGNOSIS — M8949 Other hypertrophic osteoarthropathy, multiple sites: Secondary | ICD-10-CM

## 2017-04-19 DIAGNOSIS — M150 Primary generalized (osteo)arthritis: Principal | ICD-10-CM

## 2017-04-19 DIAGNOSIS — M159 Polyosteoarthritis, unspecified: Secondary | ICD-10-CM

## 2017-04-19 MED ORDER — OXYCODONE HCL 5 MG PO TABS: 5.0000 mg | ORAL_TABLET | Freq: Four times a day (QID) | ORAL | 0 refills | Status: DC | PRN

## 2017-04-19 NOTE — Telephone Encounter (Signed)
Copied from [place] [id number]. Topic: Quick Communication - Rx Refill/Question >> Apr 19, 2017  8:59 AM Celedonio Savage L wrote: Medication: oxyCODONE (OXY IR/ROXICODONE) 5 MG immediate release tablet   Has the patient contacted their pharmacy? Yes.     (Agent: If no, request that the patient contact the pharmacy for the refill.)   Preferred Pharmacy (with phone number or street name): [facility] [place], [place] [phone] (Phone) [phone] (Fax)     Agent: Please be advised that RX refills may take up to 3 business days. We ask that you follow-up with your pharmacy.

## 2017-04-19 NOTE — Telephone Encounter (Signed)
Indication for chronic opioid: Chronic lumbago, OA Medication and dose: Oxycodone 5 mg # pills per month: 60 Last UDS date: 11/2016 Pain contract signed (Y/N): Y Date narcotic database last reviewed (include red flags): 04/19/17

## 2017-04-19 NOTE — Telephone Encounter (Signed)
LOV 02/19/17 [place].

## 2017-05-01 ENCOUNTER — Other Ambulatory Visit: Payer: Self-pay | Admitting: Emergency Medicine

## 2017-05-01 MED ORDER — ISOSORBIDE MONONITRATE ER 30 MG PO TB24: 30.0000 mg | ORAL_TABLET | Freq: Every day | ORAL | 1 refills | Status: DC

## 2017-05-14 ENCOUNTER — Telehealth: Payer: Self-pay | Admitting: Physician Assistant

## 2017-05-14 DIAGNOSIS — M150 Primary generalized (osteo)arthritis: Principal | ICD-10-CM

## 2017-05-14 DIAGNOSIS — R748 Abnormal levels of other serum enzymes: Secondary | ICD-10-CM | POA: Diagnosis not present

## 2017-05-14 DIAGNOSIS — M159 Polyosteoarthritis, unspecified: Secondary | ICD-10-CM

## 2017-05-14 DIAGNOSIS — M8949 Other hypertrophic osteoarthropathy, multiple sites: Secondary | ICD-10-CM

## 2017-05-14 DIAGNOSIS — K760 Fatty (change of) liver, not elsewhere classified: Secondary | ICD-10-CM | POA: Diagnosis not present

## 2017-05-14 NOTE — Telephone Encounter (Signed)
Copied from [place] [phone]. Topic: Quick Communication - Rx Refill/Question >> May 14, 2017  9:13 AM Synthia Innocent wrote: Medication: oxyCODONE (OXY IR/ROXICODONE) 5 MG immediate release tablet Has the patient contacted their pharmacy? Yes.   (Agent: If no, request that the patient contact the pharmacy for the refill.) Preferred Pharmacy (with phone number or street name): [facility] [address]: Please be advised that RX refills may take up to 3 business days. We ask that you follow-up with your pharmacy.

## 2017-05-14 NOTE — Telephone Encounter (Signed)
Request for Oxycodone IR 5 mg tablet. * Provider: Raiford Noble, PA * LOV: 02/19/17 * [facility]acy: [facility] [address]

## 2017-05-15 MED ORDER — OXYCODONE HCL 5 MG PO TABS
5.0000 mg | ORAL_TABLET | Freq: Four times a day (QID) | ORAL | 0 refills | Status: DC | PRN
Start: 2017-05-15 — End: 2017-06-11

## 2017-05-15 NOTE — Telephone Encounter (Signed)
Indication for chronic opioid: Osteoarthritis Medication and dose: Oxy IR 5 # pills per month: 60 Last UDS date: 11/17/16 Pain contract signed (Y/N): Yes. Due for update at next follow-up Date narcotic database last reviewed (include red flags): 05/15/2017 - No red flags. Copy to be scanned into EMR.   Rx filled via [employer]).

## 2017-05-24 ENCOUNTER — Ambulatory Visit (HOSPITAL_COMMUNITY)
Admission: RE | Admit: 2017-05-24 | Discharge: 2017-05-24 | Disposition: A | Discharge status: Home / Self Care | Payer: Medicare Other | Source: Ambulatory Visit | Attending: Cardiology | Admitting: Cardiology

## 2017-05-24 VITALS — BP 164/82 | HR 74 | Wt 181.5 lb

## 2017-05-24 DIAGNOSIS — N400 Enlarged prostate without lower urinary tract symptoms: Secondary | ICD-10-CM | POA: Diagnosis not present

## 2017-05-24 DIAGNOSIS — Z8546 Personal history of malignant neoplasm of prostate: Secondary | ICD-10-CM | POA: Insufficient documentation

## 2017-05-24 DIAGNOSIS — I6523 Occlusion and stenosis of bilateral carotid arteries: Secondary | ICD-10-CM

## 2017-05-24 DIAGNOSIS — I2510 Atherosclerotic heart disease of native coronary artery without angina pectoris: Secondary | ICD-10-CM | POA: Diagnosis not present

## 2017-05-24 DIAGNOSIS — Z8673 Personal history of transient ischemic attack (TIA), and cerebral infarction without residual deficits: Secondary | ICD-10-CM | POA: Diagnosis not present

## 2017-05-24 DIAGNOSIS — I6521 Occlusion and stenosis of right carotid artery: Secondary | ICD-10-CM | POA: Insufficient documentation

## 2017-05-24 DIAGNOSIS — M1990 Unspecified osteoarthritis, unspecified site: Secondary | ICD-10-CM | POA: Diagnosis not present

## 2017-05-24 DIAGNOSIS — Z79899 Other long term (current) drug therapy: Secondary | ICD-10-CM | POA: Insufficient documentation

## 2017-05-24 DIAGNOSIS — E785 Hyperlipidemia, unspecified: Secondary | ICD-10-CM | POA: Insufficient documentation

## 2017-05-24 DIAGNOSIS — I350 Nonrheumatic aortic (valve) stenosis: Secondary | ICD-10-CM | POA: Diagnosis not present

## 2017-05-24 DIAGNOSIS — D696 Thrombocytopenia, unspecified: Secondary | ICD-10-CM | POA: Insufficient documentation

## 2017-05-24 DIAGNOSIS — K219 Gastro-esophageal reflux disease without esophagitis: Secondary | ICD-10-CM | POA: Insufficient documentation

## 2017-05-24 DIAGNOSIS — I714 Abdominal aortic aneurysm, without rupture: Secondary | ICD-10-CM | POA: Diagnosis not present

## 2017-05-24 DIAGNOSIS — Z87891 Personal history of nicotine dependence: Secondary | ICD-10-CM | POA: Diagnosis not present

## 2017-05-24 DIAGNOSIS — Z9049 Acquired absence of other specified parts of digestive tract: Secondary | ICD-10-CM | POA: Diagnosis not present

## 2017-05-24 DIAGNOSIS — Z820 Family history of epilepsy and other diseases of the nervous system: Secondary | ICD-10-CM | POA: Insufficient documentation

## 2017-05-24 DIAGNOSIS — Z7982 Long term (current) use of aspirin: Secondary | ICD-10-CM | POA: Diagnosis not present

## 2017-05-24 DIAGNOSIS — I10 Essential (primary) hypertension: Secondary | ICD-10-CM | POA: Diagnosis not present

## 2017-05-24 DIAGNOSIS — Z8249 Family history of ischemic heart disease and other diseases of the circulatory system: Secondary | ICD-10-CM | POA: Insufficient documentation

## 2017-05-24 DIAGNOSIS — Z981 Arthrodesis status: Secondary | ICD-10-CM | POA: Diagnosis not present

## 2017-05-24 DIAGNOSIS — I7143 Infrarenal abdominal aortic aneurysm, without rupture: Secondary | ICD-10-CM

## 2017-05-24 MED ORDER — LISINOPRIL 10 MG PO TABS: 10.0000 mg | ORAL_TABLET | Freq: Every day | ORAL | 3 refills | Status: DC

## 2017-05-24 NOTE — Patient Instructions (Signed)
Start Lisinopril 10 mg daily  Labs in 2 weeks at your Primary Care MD's office  Your physician has requested that you regularly monitor and record your blood pressure readings at home. Please use the same machine at the same time of day to check your readings and record them to bring to your follow-up visit.  PLEASE CALL us IN 2 WEEKS WITH THE READINGS.  Your physician has requested that you have an echocardiogram. Echocardiography is a painless test that uses sound waves to create images of your heart. It provides your doctor with information about the size and shape of your heart and how well your heart's chambers and valves are working. This procedure takes approximately one hour. There are no restrictions for this procedure.  IN June  Your physician has requested that you have an abdominal aorta duplex. During this test, an ultrasound is used to evaluate the aorta. Allow 30 minutes for this exam. Do not eat after midnight the day before and avoid carbonated beverages.  IN [place]  Your physician has requested that you have a carotid duplex. This test is an ultrasound of the carotid arteries in your neck. It looks at blood flow through these arteries that supply the brain with blood. Allow one hour for this exam. There are no restrictions or special instructions.  IN October   We will contact you in 6 months to schedule your next appointment.   Marland Kitchen

## 2017-05-27 NOTE — Progress Notes (Signed)
Patient ID: Brandon [name], male   DOB: 1940/05/12, 77 y.o.   MRN: [id number] PCP: Elyn Aquas Cardiology: Dr. Aundra Dubin  77 y.o.with history of CAD, HTN, and hyperlipidemia presents for followup.  Patient had described some exertional chest pain and myoview showed a partially reversible inferior defect, so LHC was done in 7/10.  This showed that his proximal RCA was totally occluded.  There were very robust left to right collaterals.  There was also a 50% proximal LAD stenosis.  The patient was managed medically.    Most recent echo in 6/18 showed EF 55-60% with moderate AS.    Earlier in 2018, he developed a Strep intermedius liver abscess.  He had a hepatic drain placed and also had a right pleural effusion requiring diuresis.    Returns today for followup of CAD and aortic stenosis. Weight is up, but had lost a lot of weight while being treated for liver abscess.  BP is running high.  No exertional dyspnea.  No chest pain.  He is working as a Chief Strategy Officer still.  Main complaint is low back pain.   ECG: NSR, PAC (personally reviewed)   Labs (12/10): HDL 28, LDL 70, K 4.3, creatinine 0.8 Labs (4/11): K 4, creatinine 0.8, LDL 53, HDL 26, LFTs normal Labs (12/11): LDL 74, HDL 27, K 4, creatinine 0.9, LFTs normal, TSH normal Labs (7/12): LDL 87, HDL 33 Labs (9/12): LDL 66, HDL 33, LFTs normal Labs (9/13): LDL 62, HDL 30, K 4.1, creatinine 0.8 Labs (4/14): LDL 88, HDL 27, K 4.4, creatinine 0.8 Labs (4/15): K 4.4, creatinine 0.76 Labs (4/16): LDL 69, HDL 32 Labs (11/16): K 4.3, creatinine 0.76, LDL 68, HDL 31 Labs (1/18): LDL 72 Labs (6/18): K 4.2, creatinine 0.6, LFTs normal, hgb 8.1.  Labs (9/18): K 4, creatinine 0.6, LDL 79, HDL 36 Labs (1/19): K 3.9, creatinine 0.67  Allergies (verified):  1)  ! Celebrex  Past Medical History: 1. Hyperlipidemia: Myalgias with atorvastatin and Crestor. 2. Aortic stenosis.  Echocardiogram in August 2009 showed EF 60%, no regional wall motion abnormalities,  mild LVH, moderate focal basal septal hypertrophy, and mild diastolic dysfunction.  There was a very mild aortic stenosis with partially fused left and right coronary cusps and some restricted motion of the aortic valve.  The mean gradient, however, across the aortic valve was only 8 mmHg.  There was also mild left atrial enlargement and normal RV size and function.  Minimal AS on LHC in 7/10.  Echo (7/14) with EF 60-65%, mild LVH, mild AS mean gradient 13 mmHg.  Echo (6/16) with EF 55-60%, mild AS with mean gradient 14 mmHg.  - Echo (6/18): EF 55-60%, mild LVH, moderate AS mean gradient 27 mmHg, mild AI.  3. Hypertension. 4. Gastroesophageal reflux disease: h/o esophageal stricture.  5. BPH. 6. Diverticulosis. 7. Smoked until 7/10. 8. CAD: LHC (7/10) showed totally occluded proximal RCA with very robust left to right collaterals, 50% proximal LAD stenosis, EF 65%.  Medical management.   9. Osteoarthritis 10. Colonic polyps 11. h/o TIA 12. 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  This manifested with slurred speech. 13. History of low back pain.  The patient is status post surgical back fusion. 14. History of cholecystectomy. 15. Cataract surgery 16. Prostate cancer: treated 108. Liver abscess: Strep intermedius, 2018.  18. AAA: Noted in 8/18 on CT abdomen, 4.3 cm.  19. Carotid stenosis: carotid dopplers 10/18 with 40-59% RICA stenosis.   Family History: Father alive w/  CAD & [facility]G [facility] in [facility], had TAVR as well.  Mother died age 27 w/ Alzheimer's disease, hx of DJD 1 Sibling- Brother alive & well w/ DJD in his hips  Social History: Married- 69rd marriage, wife= Etta 2 children from 1st marriage, 4 step children Former smoker, 40+yrs of >1ppd, quit 7/10.  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     Current Outpatient Medications  Medication Sig Dispense Refill  . amLODipine (NORVASC) 10 MG tablet Take 1 tablet (10 mg total) by mouth daily. 90 tablet 0  . aspirin 81 MG EC tablet Take 81 mg by mouth at bedtime.     . cholecalciferol (VITAMIN D) 1000 UNITS tablet Take 1,000 Units by mouth daily.     . iron polysaccharides (NU-IRON) 150 MG capsule Take 1 capsule (150 mg total) by mouth 2 (two) times daily.    . isosorbide mononitrate (IMDUR) 30 MG 24 hr tablet Take 1 tablet (30 mg total) by mouth at bedtime. 90 tablet 1  . metoprolol tartrate (LOPRESSOR) 25 MG tablet Take 0.5 tablets (12.5 mg total) by mouth 2 (two) times daily. 30 tablet 5  . Multiple Vitamins-Minerals (MULTIVITAMIN PO) Take 1 tablet by mouth daily.    Marland Kitchen omeprazole (PRILOSEC) 20 MG capsule TAKE 1 CAPSULE(20 MG) BY MOUTH DAILY 30 capsule 11  . oxyCODONE (OXY IR/ROXICODONE) 5 MG immediate release tablet Take 1 tablet (5 mg total) by mouth every 6 (six) hours as needed for moderate pain. 60 tablet 0  . simvastatin (ZOCOR) 20 MG tablet Take 1 tablet (20 mg total) by mouth at bedtime. 90 tablet 3  . lisinopril (PRINIVIL,ZESTRIL) 10 MG tablet Take 1 tablet (10 mg total) by mouth daily. 90 tablet 3   No current facility-administered medications for this encounter.     BP (!) 164/82   Pulse 74   Wt 181 lb 8 oz (82.3 kg)   SpO2 98%   BMI 28.43 kg/m  General: NAD Neck: No JVD, no thyromegaly or thyroid nodule.  Lungs: Clear to auscultation bilaterally with normal respiratory effort. CV: Nondisplaced PMI.  Heart regular S1/S2, no T5/V7, 3/6 systolic crescendo-decrescendo murmur RUSB with clear S2.  No peripheral edema.  Right carotid bruit.  Normal pedal pulses.  Abdomen: Soft, nontender, no hepatosplenomegaly, no distention.  Skin: Intact without lesions or rashes.  Neurologic: Alert and oriented x 3.  Psych: Normal affect. Extremities: No clubbing or cyanosis.  HEENT: Normal.   Assessment/Plan:  1. AORTIC  STENOSIS: Moderate by Echo in 07/2016. Mean gradient 27 mm Hg.  - Follow up echo in 6/19.  2. CAD: Stable. Denies chest pain - Continue simvastatin and ASA 81 daily.  3. HYPERLIPIDEMIA:   - Continue simvastatin, will check lipids.  4. HYPERTENSION: BP elevated. His benazepril was stopped in June 2018 due to soft BP's (also had ongoing infection at this time).  - Continue amlodipine. - Start lisinopril 10 mg daily with BMET in 1 week.  He will check BP daily and call us with readings on lisinopril in 2 wks.   5. Abdominal aortic aneurysm: 4.3 cm by CT in 09/2016 - Recommend follow up in one year, will get abdominal US 8/19.   6. Thrombocytopenia: Platelet low chronically.  7. Carotid stenosis: Due for repeat carotid dopplers in 10/19.   Followup in 6 months.   Antoneo Ghrist [employer]  05/27/2017    

## 2017-05-29 NOTE — Progress Notes (Signed)
Subjective:   Brandon [name] is a 77 y.o. male who presents for Medicare Annual/Subsequent preventive examination.  Review of Systems:  No ROS.  Medicare Wellness Visit. Additional risk factors are reflected in the social history.  Cardiac Risk Factors include: advanced age (>11men, >31 women);dyslipidemia;hypertension;male gender;family history of premature cardiovascular disease   Sleep patterns: Sleeps 7 hours. Occasional difficulty falling asleep.  Home Safety/Smoke Alarms: Feels safe in home. Smoke alarms in place.  Living environment; residence and Firearm Safety: Lives with wife in 2 story home, no problems with steps. Seat Belt Safety/Bike Helmet: Wears seat belt.   Male:   CCS-colonoscopy 03/23/2015, benign. Recall 5 years     PSA-  Lab Results  Component Value Date   PSA 4.95 (H) 01/27/2013   PSA 2.71 10/11/2011   PSA 3.30 08/08/2010       Objective:    Vitals: BP 134/68 (BP Location: Left Arm, Patient Position: Sitting, Cuff Size: Normal)   Pulse 65   Temp 97.7 F (36.5 C) (Temporal)   Resp 18   Ht 5\' 7"  (1.702 m)   Wt 184 lb (83.5 kg)   SpO2 98%   BMI 28.82 kg/m   Body mass index is 28.82 kg/m.  Advanced Directives 05/30/2017 07/19/2016 07/10/2016 07/10/2016 07/06/2016 06/16/2016 02/15/2016  Does Patient Have a Medical Advance Directive? Yes Yes Yes No No Yes Yes  Type of Paramedic of [place];Living will [place];Living will [place];Living will [place];Living will - [place];Living will [place];Living will  Does patient want to make changes to medical advance directive? - No - Patient declined No - Patient declined - - - No - Patient declined  Copy of Blue in Chart? No - copy requested No - copy requested No - copy requested No - copy requested - No - copy requested No - copy requested  Would patient like information  on creating a medical advance directive? - No - Patient declined No - Patient declined - No - Patient declined - -    Tobacco Social History   Tobacco Use  Smoking Status Former Smoker  . Packs/day: 1.50  . Years: 50.00  . Pack years: 75.00  . Types: Cigarettes  . Last attempt to quit: 06/2015  . Years since quitting: 1.9  Smokeless Tobacco Never Used  Tobacco Comment   smoked less than 1 ppd for 40+ years; Using Vapor Cig     Counseling given: Not Answered Comment: smoked less than 1 ppd for 40+ years; Using Vapor Cig     Past Medical History:  Diagnosis Date  . Aortic stenosis    mild echocardiogram 8/09 EF 60%, showed no regional wall motion abnml, mild LVH, mod focal basal septal hypertrophy and mild dyastolic dysfunction. 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 mean gradient across aortic valve was 8 mmHG. also mild L atrial enlargement and normal RV size and function. minimal AS on LHC in 7/10.   . Arthritis    "all over" (07/19/2016)  . BPH (benign prostatic hypertrophy)   . CAD (coronary artery disease)    LCH (7/10) totally occluded proximal RCA with very robuse L to R collaterals, 50% proximal LAD stenosis, EF 65%, medical management.    . Chicken pox   . Chronic lower back pain    s/p surgical fusion  . Diverticulosis   . Esophageal stricture   .  GERD (gastroesophageal reflux disease)   . Heart murmur   . Hepatitis B 1984  . Hiatal hernia   . HLD (hyperlipidemia)   . HTN (hypertension)   . 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 Osteoarthritis   . TIA (transient ischemic attack) 1990s   hx  . Tubular adenoma of colon 2009   Past Surgical History:  Procedure Laterality Date  . BACK SURGERY    . CATARACT EXTRACTION W/ INTRAOCULAR LENS  IMPLANT, BILATERAL Bilateral 01/2012 - 02/2012  . ESOPHAGOGASTRODUODENOSCOPY (EGD) WITH ESOPHAGEAL DILATION     "couple times" (07/19/2016)  . IR THORACENTESIS ASP PLEURAL SPACE W/IMG GUIDE   07/19/2016  . LAPAROSCOPIC CHOLECYSTECTOMY  1994  . LUMBAR DISC SURGERY  05/1996   L4-5; Dr. Sherwood Gambler   . LUMBAR LAMINECTOMY/DECOMPRESSION MICRODISCECTOMY  10/2002   L3-4. Dr. Sherwood Gambler  . MULTIPLE TOOTH EXTRACTIONS  1980s  . PICC LINE INSERTION  07/15/2016  . POSTERIOR LUMBAR FUSION  09/1996   Ray cage, L4-5 Dr. Rita Ohara  . PROSTATE BIOPSY  ~ 2017   Family History  Problem Relation Age of Onset  . Heart disease Father 23       Living  . Coronary artery disease Father        CABG  . Alzheimer's disease Mother 82       Deceased  . Arthritis/Rheumatoid Mother   . Stomach cancer Maternal Uncle   . Brain cancer Maternal Aunt        x2  . Arthritis Brother        DJD  . Obesity Daughter        Had Bypass Sx   Social History   Socioeconomic History  . Marital status: Married    Spouse name: etta  . Number of children: 2  . Years of education: Not on file  . Highest education level: Not on file  Occupational History  . Occupation: retired    Fish farm manager: RETIRED    Comment: disabled due to back problems  Social Needs  . Financial resource strain: Not on file  . Food insecurity:    Worry: Not on file    Inability: Not on file  . Transportation needs:    Medical: Not on file    Non-medical: Not on file  Tobacco Use  . Smoking status: Former Smoker    Packs/day: 1.50    Years: 50.00    Pack years: 75.00    Types: Cigarettes    Last attempt to quit: 06/2015    Years since quitting: 1.9  . Smokeless tobacco: Never Used  . Tobacco comment: smoked less than 1 ppd for 40+ years; Using Vapor Cig  Substance and Sexual Activity  . Alcohol use: No    Alcohol/week: 0.0 oz    Comment: 07/19/2016 "might have a few drinks/year"  . Drug use: No  . Sexual activity: Never  Lifestyle  . Physical activity:    Days per week: Not on file    Minutes per session: Not on file  . Stress: Not on file  Relationships  . Social connections:    Talks on phone: Not on file    Gets  together: Not on file    Attends religious service: Not on file    Active member of club or organization: Not on file    Attends meetings of clubs or organizations: Not on file    Relationship status: Not on file  Other Topics Concern  . Not on file  Social History Narrative  Married (3rd), [place]. 2 children from 1st marriage, 4 step children.    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       Cell # [id number]    Outpatient Encounter Medications as of 05/30/2017  Medication Sig  . amLODipine (NORVASC) 10 MG tablet Take 1 tablet (10 mg total) by mouth daily.  Marland Kitchen aspirin 81 MG EC tablet Take 81 mg by mouth at bedtime.   . cholecalciferol (VITAMIN D) 1000 UNITS tablet Take 1,000 Units by mouth daily.   . iron polysaccharides (NU-IRON) 150 MG capsule Take 1 capsule (150 mg total) by mouth 2 (two) times daily.  . isosorbide mononitrate (IMDUR) 30 MG 24 hr tablet Take 1 tablet (30 mg total) by mouth at bedtime.  Marland Kitchen lisinopril (PRINIVIL,ZESTRIL) 10 MG tablet Take 1 tablet (10 mg total) by mouth daily.  . metoprolol tartrate (LOPRESSOR) 25 MG tablet Take 0.5 tablets (12.5 mg total) by mouth 2 (two) times daily.  . Multiple Vitamins-Minerals (MULTIVITAMIN PO) Take 1 tablet by mouth daily.  . Omega-3 Fatty Acids (FISH OIL PO) Take by mouth 2 (two) times daily.  Marland Kitchen omeprazole (PRILOSEC) 20 MG capsule TAKE 1 CAPSULE(20 MG) BY MOUTH DAILY  . oxyCODONE (OXY IR/ROXICODONE) 5 MG immediate release tablet Take 1 tablet (5 mg total) by mouth every 6 (six) hours as needed for moderate pain.  . simvastatin (ZOCOR) 20 MG tablet Take 1 tablet (20 mg total) by mouth at bedtime.  . TURMERIC PO Take by mouth.   No facility-administered encounter medications on file as of 05/30/2017.     Activities of Daily Living In your present state of health, do you have any difficulty performing the following activities: 05/30/2017 07/19/2016    Hearing? N Y  Vision? N N  Difficulty concentrating or making decisions? Y N  Walking or climbing stairs? N Y  Dressing or bathing? N N  Doing errands, shopping? N Y  Conservation officer, nature and eating ? N -  Using the Toilet? N -  In the past six months, have you accidently leaked urine? N -  Do you have problems with loss of bowel control? N -  Managing your Medications? N -  Managing your Finances? N -  Housekeeping or managing your Housekeeping? N -  Some recent data might be hidden    Patient Care Team: Delorse Limber as PCP - General (Physician Assistant) Rana Snare, MD as Consulting Physician (Urology) Ladene Artist, MD as Consulting Physician (Gastroenterology) Larey Dresser, MD as Consulting Physician (Cardiology) Jovita Gamma, MD as Consulting Physician (Neurosurgery)   Assessment:   This is a routine wellness examination for Greig.  Exercise Activities and Dietary recommendations Current Exercise Habits: The patient does not participate in regular exercise at present(Stays active with hobbies), Exercise limited by: None identified   Diet (meal preparation, eat out, water intake, caffeinated beverages, dairy products, fruits and vegetables): Drinks water, tea and coffee.   Breakfast: whole wheat toast/butter; rolls; eggs/bacon Lunch: sandwich Dinner: protein and veggies.   Goals    . Patient Stated     Maintain current health.        Fall Risk Fall Risk  05/30/2017 10/02/2016 08/03/2016 02/15/2016 01/25/2016  Falls in the past year? No No No Yes No  Number falls in past yr: - - - 1 -  Injury with Fall? - - - No -  Follow up - - -  Falls prevention discussed -    Depression Screen PHQ 2/9 Scores 05/30/2017 10/02/2016 09/19/2016 08/03/2016  PHQ - 2 Score 0 0 0 1  PHQ- 9 Score - - 0 -    Cognitive Function MMSE - Mini Mental State Exam 05/30/2017  Orientation to time 5  Orientation to Place 5  Registration 3  Attention/ Calculation 5  Recall 3   Language- name 2 objects 2  Language- repeat 1  Language- follow 3 step command 3  Language- read & follow direction 1  Write a sentence 1  Copy design 1  Total score 30        Immunization History  Administered Date(s) Administered  . Influenza Split 11/26/2010, 10/11/2011, 11/03/2012  . Influenza Whole 11/14/2006, 12/04/2007, 10/06/2008, 10/25/2009  . Influenza, High Dose Seasonal PF 11/01/2016  . Influenza, Seasonal, Injecte, Preservative Fre 12/07/2014  . Influenza,inj,Quad PF,6+ Mos 11/04/2015  . Influenza-Unspecified 11/19/2013  . Pneumococcal Conjugate-13 12/07/2014  . Pneumococcal Polysaccharide-23 05/30/2017     Screening Tests Health Maintenance  Topic Date Due  . DTaP/Tdap/Td (1 - Tdap) 02/05/2018 (Originally 10/03/1959)  . TETANUS/TDAP  05/31/2018 (Originally 10/03/1959)  . INFLUENZA VACCINE  09/05/2017  . COLONOSCOPY  03/22/2020  . PNA vac Low Risk Adult  Completed        Plan:    Bring a copy of your living will and/or healthcare power of attorney to your next office visit.  Continue doing brain stimulating activities (puzzles, reading, adult coloring books, staying active) to keep memory sharp.   I have personally reviewed and noted the following in the patient's chart:   . Medical and social history . Use of alcohol, tobacco or illicit drugs  . Current medications and supplements . Functional ability and status . Nutritional status . Physical activity . Advanced directives . List of other physicians . Hospitalizations, surgeries, and ER visits in previous 12 months . Vitals . Screenings to include cognitive, depression, and falls . Referrals and appointments  In addition, I have reviewed and discussed with patient certain preventive protocols, quality metrics, and best practice recommendations. A written personalized care plan for preventive services as well as general preventive health recommendations were provided to patient.     Gerilyn Nestle, RN  05/30/2017

## 2017-05-30 ENCOUNTER — Encounter: Payer: Self-pay | Admitting: Physician Assistant

## 2017-05-30 ENCOUNTER — Ambulatory Visit: Payer: Medicare Other | Admitting: Physician Assistant

## 2017-05-30 ENCOUNTER — Ambulatory Visit (INDEPENDENT_AMBULATORY_CARE_PROVIDER_SITE_OTHER): Payer: Medicare Other

## 2017-05-30 ENCOUNTER — Other Ambulatory Visit: Payer: Self-pay

## 2017-05-30 VITALS — BP 134/68 | HR 65 | Temp 97.7°F | Resp 18 | Ht 67.0 in | Wt 184.0 lb

## 2017-05-30 DIAGNOSIS — Z0000 Encounter for general adult medical examination without abnormal findings: Secondary | ICD-10-CM | POA: Diagnosis not present

## 2017-05-30 DIAGNOSIS — I10 Essential (primary) hypertension: Secondary | ICD-10-CM

## 2017-05-30 DIAGNOSIS — Z23 Encounter for immunization: Secondary | ICD-10-CM | POA: Diagnosis not present

## 2017-05-30 DIAGNOSIS — M150 Primary generalized (osteo)arthritis: Secondary | ICD-10-CM

## 2017-05-30 DIAGNOSIS — M8949 Other hypertrophic osteoarthropathy, multiple sites: Secondary | ICD-10-CM

## 2017-05-30 DIAGNOSIS — M5441 Lumbago with sciatica, right side: Secondary | ICD-10-CM

## 2017-05-30 DIAGNOSIS — M159 Polyosteoarthritis, unspecified: Secondary | ICD-10-CM

## 2017-05-30 LAB — LIPID PANEL
CHOL/HDL RATIO: 4
Cholesterol: 149 mg/dL (ref 0–200)
HDL: 40.9 mg/dL (ref >39.00)
LDL Cholesterol: 89 mg/dL (ref 0–99)
NONHDL: 107.83
Triglycerides: 95 mg/dL (ref 0.0–149.0)
VLDL: 19 mg/dL (ref 0.0–40.0)

## 2017-05-30 LAB — BASIC METABOLIC PANEL
BUN: 9 mg/dL (ref 6–23)
CHLORIDE: 104 meq/L (ref 96–112)
CO2: 28 meq/L (ref 19–32)
Calcium: 8.8 mg/dL (ref 8.4–10.5)
Creatinine, Ser: 0.67 mg/dL (ref 0.40–1.50)
GFR: 123.02 mL/min (ref >60.00)
GLUCOSE: 141 mg/dL — AB (ref 70–99)
Potassium: 3.9 mEq/L (ref 3.5–5.1)
SODIUM: 139 meq/L (ref 135–145)

## 2017-05-30 MED ORDER — METHYLPREDNISOLONE 4 MG PO TBPK: ORAL_TABLET | ORAL | 0 refills | Status: DC

## 2017-05-30 NOTE — Progress Notes (Signed)
Reviewed and updated  today 05/30/17  Indication for chronic opioid: OA of multiple joints Medication and dose: Oxycodone IR 5 mg # pills per month: 40 Last UDS date: 11/17/2016 - low risk Pain contract signed (date): renewed today.  Date narcotic database last reviewed (include red flags): 05/30/2017  Pain Inventory (1-10 worse): Average Pain 4-5 Pain Right Now 4 My pain is dull and constant (character i.e. sharp, stabbing, dull, constant etc)  Pain is worse with: laying, sitting Relief from Meds: Good  In the last 24 hours, has pain interfered with the following (1-10 greatest interference) ? General activity 3 Relation with others 0 Enjoyment of life 3 What TIME of day is your pain at its worst? nighttime       Sleep (in general) depends on pain level  Mobility/Function: Assistance device: None How many minutes can you walk? Non-impaired. Ability to climb steps?  Yes Do you drive? Yes Disabled (date): No  Neuro/Psych Sx: (bladder, bowel, weakness, dizziness, depression etc) Some depressed mood on occasion.   Physicians involved in your care: Any changes since last visit?  None  Patient has noted some acute R-sided low back pain over the past couple of weeks with occasional radiation into the RLE. Denies numbness, tingling or weakness. Denies known trauma or injury.    Past Medical History:  Diagnosis Date  . Aortic stenosis    mild echocardiogram 8/09 EF 60%, showed no regional wall motion abnml, mild LVH, mod focal basal septal hypertrophy and mild dyastolic dysfunction. 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 mean gradient across aortic valve was 8 mmHG. also mild L atrial enlargement and normal RV size and function. minimal AS on LHC in 7/10.   . Arthritis    "all over" (07/19/2016)  . BPH (benign prostatic hypertrophy)   . CAD (coronary artery disease)    LCH (7/10) totally occluded proximal RCA with very robuse L  to R collaterals, 50% proximal LAD stenosis, EF 65%, medical management.    . Chicken pox   . Chronic lower back pain    s/p surgical fusion  . Diverticulosis   . Esophageal stricture   . GERD (gastroesophageal reflux disease)   . Heart murmur   . Hepatitis B 1984  . Hiatal hernia   . HLD (hyperlipidemia)   . HTN (hypertension)   . 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 Osteoarthritis   . TIA (transient ischemic attack) 1990s   hx  . Tubular adenoma of colon 2009    Current Outpatient Medications on File Prior to Visit  Medication Sig Dispense Refill  . amLODipine (NORVASC) 10 MG tablet Take 1 tablet (10 mg total) by mouth daily. 90 tablet 0  . aspirin 81 MG EC tablet Take 81 mg by mouth at bedtime.     . cholecalciferol (VITAMIN D) 1000 UNITS tablet Take 1,000 Units by mouth daily.     . iron polysaccharides (NU-IRON) 150 MG capsule Take 1 capsule (150 mg total) by mouth 2 (two) times daily.    . isosorbide mononitrate (IMDUR) 30 MG 24 hr tablet Take 1 tablet (30 mg total) by mouth at bedtime. 90 tablet 1  . lisinopril (PRINIVIL,ZESTRIL) 10 MG tablet Take 1 tablet (10 mg total) by mouth daily. 90 tablet 3  . metoprolol tartrate (LOPRESSOR) 25 MG tablet Take 0.5 tablets (12.5 mg total) by mouth 2 (two) times daily. 30 tablet 5  . Multiple Vitamins-Minerals (MULTIVITAMIN PO) Take 1 tablet  by mouth daily.    Marland Kitchen omeprazole (PRILOSEC) 20 MG capsule TAKE 1 CAPSULE(20 MG) BY MOUTH DAILY 30 capsule 11  . oxyCODONE (OXY IR/ROXICODONE) 5 MG immediate release tablet Take 1 tablet (5 mg total) by mouth every 6 (six) hours as needed for moderate pain. 60 tablet 0  . simvastatin (ZOCOR) 20 MG tablet Take 1 tablet (20 mg total) by mouth at bedtime. 90 tablet 3   No current facility-administered medications on file prior to visit.     Allergies  Allergen Reactions  . Celebrex [Celecoxib] Hives and Itching    Family History  Problem Relation Age of Onset  . Heart disease Father 67       Living  .  Coronary artery disease Father        CABG  . Alzheimer's disease Mother 40       Deceased  . Arthritis/Rheumatoid Mother   . Stomach cancer Maternal Uncle   . Brain cancer Maternal Aunt        x2  . Arthritis Brother        DJD  . Obesity Daughter        Had Bypass Sx    Social History   Socioeconomic History  . Marital status: Married    Spouse name: etta  . Number of children: 2  . Years of education: Not on file  . Highest education level: Not on file  Occupational History  . Occupation: retired    Fish farm manager: RETIRED    Comment: disabled due to back problems  Social Needs  . Financial resource strain: Not on file  . Food insecurity:    Worry: Not on file    Inability: Not on file  . Transportation needs:    Medical: Not on file    Non-medical: Not on file  Tobacco Use  . Smoking status: Former Smoker    Packs/day: 1.50    Years: 50.00    Pack years: 75.00    Types: Cigarettes    Last attempt to quit: 06/2015    Years since quitting: 1.9  . Smokeless tobacco: Never Used  . Tobacco comment: smoked less than 1 ppd for 40+ years; Using Vapor Cig  Substance and Sexual Activity  . Alcohol use: No    Alcohol/week: 0.0 oz    Comment: 07/19/2016 "might have a few drinks/year"  . Drug use: No  . Sexual activity: Never  Lifestyle  . Physical activity:    Days per week: Not on file    Minutes per session: Not on file  . Stress: Not on file  Relationships  . Social connections:    Talks on phone: Not on file    Gets together: Not on file    Attends religious service: Not on file    Active member of club or organization: Not on file    Attends meetings of clubs or organizations: Not on file    Relationship status: Not on file  Other Topics Concern  . Not on file  Social History Narrative   Married (3rd), [place]. 2 children from 1st marriage, 4 step children.    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       Cell # [id number]   Review of Systems - See HPI.  All other ROS are negative.  BP 134/68   Pulse 65   Temp 97.7 F (36.5 C) (Oral)  Resp 18   Ht 5\' 7"  (1.702 m)   Wt 184 lb (83.5 kg)   SpO2 98%   BMI 28.82 kg/m   Physical Exam  Constitutional: He appears well-developed and well-nourished.  HENT:  Head: Normocephalic and atraumatic.  Cardiovascular: Normal rate, regular rhythm and normal heart sounds.  Pulmonary/Chest: Effort normal and breath sounds normal. No stridor. No respiratory distress. He has no wheezes. He has no rales. He exhibits no tenderness.  Musculoskeletal:       Right hip: Normal.       Lumbar back: He exhibits pain. He exhibits normal range of motion, no tenderness and no spasm.  Skin: Skin is warm.  Vitals reviewed.  Assessment/Plan: 1. Essential hypertension Labs today requested by Cardiology. Will forward results to Dr. Aundra Dubin. BP stable today. Asymptomatic.  - Basic metabolic panel - Lipid Profile  2. Primary osteoarthritis involving multiple joints UDS up-to-date. CSC renewed. Medications refilled. Continue current regimen. Follow-up 3 months.   3. Acute right-sided low back pain with right-sided sciatica Start medrol dose pack. Stretching exercises reviewed. Follow-up with Neurosurgery if not improving.  - methylPREDNISolone (MEDROL DOSEPAK) 4 MG TBPK tablet; Take following package directions.  Dispense: 21 tablet; Refill: 0   Leeanne Rio, PA-C

## 2017-05-30 NOTE — Progress Notes (Signed)
RN MWV note reviewed.  Brandon Camilo Cody Jazelle Achey, PA-C  

## 2017-05-30 NOTE — Patient Instructions (Addendum)
Bring a copy of your living will and/or healthcare power of attorney to your next office visit.  Continue doing brain stimulating activities (puzzles, reading, adult coloring books, staying active) to keep memory sharp.    Health Maintenance, Male A healthy lifestyle and preventive care is important for your health and wellness. Ask your health care provider about what schedule of regular examinations is right for you. What should I know about weight and diet? Eat a Healthy Diet  Eat plenty of vegetables, fruits, whole grains, low-fat dairy products, and lean protein.  Do not eat a lot of foods high in solid fats, added sugars, or salt.  Maintain a Healthy Weight Regular exercise can help you achieve or maintain a healthy weight. You should:  Do at least 150 minutes of exercise each week. The exercise should increase your heart rate and make you sweat (moderate-intensity exercise).  Do strength-training exercises at least twice a week.  Watch Your Levels of Cholesterol and Blood Lipids  Have your blood tested for lipids and cholesterol every 5 years starting at 75 years of age. If you are at high risk for heart disease, you should start having your blood tested when you are 75 years old. You may need to have your cholesterol levels checked more often if: ? Your lipid or cholesterol levels are high. ? You are older than 75 years of age. ? You are at high risk for heart disease.  What should I know about cancer screening? Many types of cancers can be detected early and may often be prevented. Lung Cancer  You should be screened every year for lung cancer if: ? You are a current smoker who has smoked for at least 30 years. ? You are a former smoker who has quit within the past 15 years.  Talk to your health care provider about your screening options, when you should start screening, and how often you should be screened.  Colorectal Cancer  Routine colorectal cancer screening  usually begins at 75 years of age and should be repeated every 5-10 years until you are 75 years old. You may need to be screened more often if early forms of precancerous polyps or small growths are found. Your health care provider may recommend screening at an earlier age if you have risk factors for colon cancer.  Your health care provider may recommend using home test kits to check for hidden blood in the stool.  A small camera at the end of a tube can be used to examine your colon (sigmoidoscopy or colonoscopy). This checks for the earliest forms of colorectal cancer.  Prostate and Testicular Cancer  Depending on your age and overall health, your health care provider may do certain tests to screen for prostate and testicular cancer.  Talk to your health care provider about any symptoms or concerns you have about testicular or prostate cancer.  Skin Cancer  Check your skin from head to toe regularly.  Tell your health care provider about any new moles or changes in moles, especially if: ? There is a change in a mole's size, shape, or color. ? You have a mole that is larger than a pencil eraser.  Always use sunscreen. Apply sunscreen liberally and repeat throughout the day.  Protect yourself by wearing long sleeves, pants, a wide-brimmed hat, and sunglasses when outside.  What should I know about heart disease, diabetes, and high blood pressure?  If you are 18-39 years of age, have your blood pressure checked every   3-5 years. If you are 40 years of age or older, have your blood pressure checked every year. You should have your blood pressure measured twice-once when you are at a hospital or clinic, and once when you are not at a hospital or clinic. Record the average of the two measurements. To check your blood pressure when you are not at a hospital or clinic, you can use: ? An automated blood pressure machine at a pharmacy. ? A home blood pressure monitor.  Talk to your health care  provider about your target blood pressure.  If you are between 45-79 years old, ask your health care provider if you should take aspirin to prevent heart disease.  Have regular diabetes screenings by checking your fasting blood sugar level. ? If you are at a normal weight and have a low risk for diabetes, have this test once every three years after the age of 45. ? If you are overweight and have a high risk for diabetes, consider being tested at a younger age or more often.  A one-time screening for abdominal aortic aneurysm (AAA) by ultrasound is recommended for men aged 65-75 years who are current or former smokers. What should I know about preventing infection? Hepatitis B If you have a higher risk for hepatitis B, you should be screened for this virus. Talk with your health care provider to find out if you are at risk for hepatitis B infection. Hepatitis C Blood testing is recommended for:  Everyone born from 1945 through 1965.  Anyone with known risk factors for hepatitis C.  Sexually Transmitted Diseases (STDs)  You should be screened each year for STDs including gonorrhea and chlamydia if: ? You are sexually active and are younger than 75 years of age. ? You are older than 75 years of age and your health care provider tells you that you are at risk for this type of infection. ? Your sexual activity has changed since you were last screened and you are at an increased risk for chlamydia or gonorrhea. Ask your health care provider if you are at risk.  Talk with your health care provider about whether you are at high risk of being infected with HIV. Your health care provider may recommend a prescription medicine to help prevent HIV infection.  What else can I do?  Schedule regular health, dental, and eye exams.  Stay current with your vaccines (immunizations).  Do not use any tobacco products, such as cigarettes, chewing tobacco, and e-cigarettes. If you need help quitting, ask  your health care provider.  Limit alcohol intake to no more than 2 drinks per day. One drink equals 12 ounces of beer, 5 ounces of wine, or 1 ounces of hard liquor.  Do not use street drugs.  Do not share needles.  Ask your health care provider for help if you need support or information about quitting drugs.  Tell your health care provider if you often feel depressed.  Tell your health care provider if you have ever been abused or do not feel safe at home. This information is not intended to replace advice given to you by your health care provider. Make sure you discuss any questions you have with your health care provider. Document Released: 07/21/2007 Document Revised: 09/21/2015 Document Reviewed: 10/26/2014 Elsevier Interactive Patient Education  2018 [employer]vier Inc.  

## 2017-05-30 NOTE — Addendum Note (Signed)
Addended by: Sabas Sous R on: 05/30/2017 10:50 AM   Modules accepted: SmartSet

## 2017-05-31 ENCOUNTER — Telehealth (HOSPITAL_COMMUNITY): Payer: Self-pay

## 2017-05-31 MED ORDER — ATORVASTATIN CALCIUM 40 MG PO TABS: 40.0000 mg | ORAL_TABLET | Freq: Every day | ORAL | 3 refills | Status: DC

## 2017-05-31 NOTE — Telephone Encounter (Signed)
Notes recorded by Shirley Muscat, RN on 05/31/2017 at 9:41 AM EDT Pt aware of results, agreeable to med changes (changes made in [facility]), and Lipid Letter mailed to patient   ------  Notes recorded by Larey Dresser, MD on 05/31/2017 at 3:00 AM EDT Goal LDL < 70, would change to more potent statin: stop Zocor, start atorvastatin 40 mg daily. ------  Notes recorded by Brunetta Jeans, PA-C on 05/30/2017 at 8:20 PM EDT Labs look good. Glucose is non-fasting so no concern today. Will forward to Dr. Aundra Dubin who had requested these tests (per patient).

## 2017-06-10 ENCOUNTER — Other Ambulatory Visit: Payer: Self-pay | Admitting: Physician Assistant

## 2017-06-10 DIAGNOSIS — M159 Polyosteoarthritis, unspecified: Secondary | ICD-10-CM

## 2017-06-10 DIAGNOSIS — M8949 Other hypertrophic osteoarthropathy, multiple sites: Secondary | ICD-10-CM

## 2017-06-10 DIAGNOSIS — M150 Primary generalized (osteo)arthritis: Principal | ICD-10-CM

## 2017-06-10 NOTE — Telephone Encounter (Signed)
Copied from [place] [id number]. Topic: Quick Communication - Rx Refill/Question >> Jun 10, 2017 12:52 PM Bea Graff, NT wrote: Medication: oxyCODONE (OXY IR/ROXICODONE) 5 MG immediate release tablet Has the patient contacted their pharmacy? Yes.   (Agent: If no, request that the patient contact the pharmacy for the refill.) Preferred Pharmacy (with phone number or street name): [facility] [phone] - [address], [place] [phone] (Phone) [phone] (Fax)   Pt will be going out of town Thursday for 10 days.  Agent: Please be advised that RX refills may take up to 3 business days. We ask that you follow-up with your pharmacy.

## 2017-06-11 MED ORDER — OXYCODONE HCL 5 MG PO TABS: 5.0000 mg | ORAL_TABLET | Freq: Four times a day (QID) | ORAL | 0 refills | Status: DC | PRN

## 2017-06-11 NOTE — Telephone Encounter (Signed)
Pt called to check on status of refill, he is going out of town Thursday morning.

## 2017-06-11 NOTE — Telephone Encounter (Signed)
Patient up-to-date on appt, CSC was signed at last visit. UDS is up-to-date.   Medication refill has been sent in. Please contact patient and let him know this is taken care of. Also I see that he called yesterday but this is the first message I am getting regarding this.

## 2017-06-11 NOTE — Telephone Encounter (Signed)
Rx refill request: oxycodone 5 mg         Last filled: 05/15/17  #[id number]   LOV: 05/30/17  PCP: [place] : verified

## 2017-06-11 NOTE — Telephone Encounter (Signed)
LM for patient letting him know.   CRM created in case patient calls back.  Okay for PEC to let patient know that medication was sent to pharmacy.

## 2017-06-21 ENCOUNTER — Telehealth (HOSPITAL_COMMUNITY): Payer: Self-pay | Admitting: *Deleted

## 2017-06-21 NOTE — Telephone Encounter (Signed)
That is ok, no changes

## 2017-06-21 NOTE — Telephone Encounter (Signed)
Pt left VM on triage line stating he was calling in his BP readings as instructed at last OV.  He states average reading is 130/70.   Will send to Dr Aundra Dubin for review

## 2017-06-24 ENCOUNTER — Other Ambulatory Visit: Payer: Self-pay

## 2017-06-24 ENCOUNTER — Ambulatory Visit: Payer: Medicare Other | Admitting: Physician Assistant

## 2017-06-24 ENCOUNTER — Encounter: Payer: Self-pay | Admitting: Physician Assistant

## 2017-06-24 VITALS — BP 120/62 | HR 73 | Temp 98.0°F | Resp 14 | Ht 67.0 in | Wt 177.0 lb

## 2017-06-24 DIAGNOSIS — R21 Rash and other nonspecific skin eruption: Secondary | ICD-10-CM | POA: Diagnosis not present

## 2017-06-24 DIAGNOSIS — H6982 Other specified disorders of Eustachian tube, left ear: Secondary | ICD-10-CM

## 2017-06-24 LAB — HEPATIC FUNCTION PANEL
ALBUMIN: 4 g/dL (ref 3.5–5.2)
ALT: 33 U/L (ref 0–53)
AST: 22 U/L (ref 0–37)
Alkaline Phosphatase: 150 U/L — ABNORMAL HIGH (ref 39–117)
Bilirubin, Direct: 0.2 mg/dL (ref 0.0–0.3)
TOTAL PROTEIN: 6.9 g/dL (ref 6.0–8.3)
Total Bilirubin: 0.7 mg/dL (ref 0.2–1.2)

## 2017-06-24 MED ORDER — PREDNISONE 10 MG PO TABS: ORAL_TABLET | ORAL | 0 refills | Status: AC

## 2017-06-24 MED ORDER — PERMETHRIN 5 % EX CREA: 1.0000 "application " | TOPICAL_CREAM | Freq: Once | CUTANEOUS | 0 refills | Status: AC

## 2017-06-24 NOTE — Telephone Encounter (Signed)
Pt aware of results and BP is great 121/63.

## 2017-06-24 NOTE — Telephone Encounter (Signed)
Left VM

## 2017-06-24 NOTE — Patient Instructions (Signed)
Please go to the lab today for blood work.  I will call you with your results. We will alter treatment regimen(s) if indicated by your results.   Use the permethrin cream and wash off as directed. Start the steroid taper and take as directed. If symptoms are not resolving, please give me a call.

## 2017-06-24 NOTE — Progress Notes (Signed)
Patient presents to clinic today c/o pruritic rash of body x 2 weeks. Denies change to soaps, lotions or detergents. Area started on chest and abdomen, has spread to extremities and buttock. Denies rash of axillary region, fingers or groin. Denies fever, chills, malaise or fatigue. Recently went to the beach. Denies contact with similar symptoms.   Patient also noting some tenderness of left ear with pressure. Notes swollen lymph node underneath the ear causing soreness in the neck. Notes that soreness and swelling are resolving. Denies fever, chills, malaise or fatigue. Denies URI symptoms.  Past Medical History:  Diagnosis Date  . Aortic stenosis    mild echocardiogram 8/09 EF 60%, showed no regional wall motion abnml, mild LVH, mod focal basal septal hypertrophy and mild dyastolic dysfunction. 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 mean gradient across aortic valve was 8 mmHG. also mild L atrial enlargement and normal RV size and function. minimal AS on LHC in 7/10.   . Arthritis    "all over" (07/19/2016)  . BPH (benign prostatic hypertrophy)   . CAD (coronary artery disease)    LCH (7/10) totally occluded proximal RCA with very robuse L to R collaterals, 50% proximal LAD stenosis, EF 65%, medical management.    . Chicken pox   . Chronic lower back pain    s/p surgical fusion  . Diverticulosis   . Esophageal stricture   . GERD (gastroesophageal reflux disease)   . Heart murmur   . Hepatitis B 1984  . Hiatal hernia   . HLD (hyperlipidemia)   . HTN (hypertension)   . 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 Osteoarthritis   . TIA (transient ischemic attack) 1990s   hx  . Tubular adenoma of colon 2009    Current Outpatient Medications on File Prior to Visit  Medication Sig Dispense Refill  . amLODipine (NORVASC) 10 MG tablet Take 1 tablet (10 mg total) by mouth daily. 90 tablet 0  . aspirin 81 MG EC tablet Take 81 mg by mouth at bedtime.     Marland Kitchen  atorvastatin (LIPITOR) 40 MG tablet Take 1 tablet (40 mg total) by mouth daily. 90 tablet 3  . cholecalciferol (VITAMIN D) 1000 UNITS tablet Take 1,000 Units by mouth daily.     . iron polysaccharides (NU-IRON) 150 MG capsule Take 1 capsule (150 mg total) by mouth 2 (two) times daily.    . isosorbide mononitrate (IMDUR) 30 MG 24 hr tablet Take 1 tablet (30 mg total) by mouth at bedtime. 90 tablet 1  . lisinopril (PRINIVIL,ZESTRIL) 10 MG tablet Take 1 tablet (10 mg total) by mouth daily. 90 tablet 3  . metoprolol tartrate (LOPRESSOR) 25 MG tablet Take 0.5 tablets (12.5 mg total) by mouth 2 (two) times daily. 30 tablet 5  . Multiple Vitamins-Minerals (MULTIVITAMIN PO) Take 1 tablet by mouth daily.    . Omega-3 Fatty Acids (FISH OIL PO) Take by mouth 2 (two) times daily.    Marland Kitchen omeprazole (PRILOSEC) 20 MG capsule TAKE 1 CAPSULE(20 MG) BY MOUTH DAILY 30 capsule 11  . oxyCODONE (OXY IR/ROXICODONE) 5 MG immediate release tablet Take 1 tablet (5 mg total) by mouth every 6 (six) hours as needed for moderate pain. 60 tablet 0  . TURMERIC PO Take by mouth.     No current facility-administered medications on file prior to visit.     Allergies  Allergen Reactions  . Celebrex [Celecoxib] Hives and Itching    Family History  Problem Relation Age of Onset  . Heart disease Father 65       Living  . Coronary artery disease Father        CABG  . Alzheimer's disease Mother 57       Deceased  . Arthritis/Rheumatoid Mother   . Stomach cancer Maternal Uncle   . Brain cancer Maternal Aunt        x2  . Arthritis Brother        DJD  . Obesity Daughter        Had Bypass Sx    Social History   Socioeconomic History  . Marital status: Married    Spouse name: etta  . Number of children: 2  . Years of education: Not on file  . Highest education level: Not on file  Occupational History  . Occupation: retired    Fish farm manager: RETIRED    Comment: disabled due to back problems  Social Needs  . Financial  resource strain: Not on file  . Food insecurity:    Worry: Not on file    Inability: Not on file  . Transportation needs:    Medical: Not on file    Non-medical: Not on file  Tobacco Use  . Smoking status: Former Smoker    Packs/day: 1.50    Years: 50.00    Pack years: 75.00    Types: Cigarettes    Last attempt to quit: 06/2015    Years since quitting: 2.0  . Smokeless tobacco: Never Used  . Tobacco comment: smoked less than 1 ppd for 40+ years; Using Vapor Cig  Substance and Sexual Activity  . Alcohol use: No    Alcohol/week: 0.0 oz    Comment: 07/19/2016 "might have a few drinks/year"  . Drug use: No  . Sexual activity: Never  Lifestyle  . Physical activity:    Days per week: Not on file    Minutes per session: Not on file  . Stress: Not on file  Relationships  . Social connections:    Talks on phone: Not on file    Gets together: Not on file    Attends religious service: Not on file    Active member of club or organization: Not on file    Attends meetings of clubs or organizations: Not on file    Relationship status: Not on file  Other Topics Concern  . Not on file  Social History Narrative   Married (3rd), [place]. 2 children from 1st marriage, 4 step children.    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       Cell # [id number]   Review of Systems - See HPI.  All other ROS are negative.  BP 120/62   Pulse 73   Temp 98 F (36.7 C) (Oral)   Resp 14   Ht 5\' 7"  (1.702 m)   Wt 177 lb (80.3 kg)   SpO2 98%   BMI 27.72 kg/m   Physical Exam  Constitutional: He appears well-developed and well-nourished.  HENT:  Head: Normocephalic and atraumatic.  Right Ear: Tympanic membrane normal.  Left Ear: A middle ear effusion (serous. Mild retraction) is present.  Nose: Nose normal.  Eyes: Pupils are equal, round, and reactive to light.  Neck: Normal range of motion. Neck supple. No  spinous process tenderness and no muscular tenderness present. No neck rigidity. Normal range of motion present. No thyromegaly present.  Cardiovascular: Normal rate, regular rhythm and normal heart sounds.  Lymphadenopathy:    He has no cervical adenopathy.  Skin: Rash (scattered lesions, seeming most consistent with bites. No lesions in webbing between of fingers.) noted.   Recent Results (from the past 2160 hour(s))  Basic metabolic panel     Status: Abnormal   Collection Time: 05/30/17 10:54 AM  Result Value Ref Range   Sodium 139 135 - 145 mEq/L   Potassium 3.9 3.5 - 5.1 mEq/L   Chloride 104 96 - 112 mEq/L   CO2 28 19 - 32 mEq/L   Glucose, Bld 141 (H) 70 - 99 mg/dL   BUN 9 6 - 23 mg/dL   Creatinine, Ser 0.67 0.40 - 1.50 mg/dL   Calcium 8.8 8.4 - 10.5 mg/dL   GFR 123.02 >60.00 mL/min  Lipid Profile     Status: None   Collection Time: 05/30/17 10:54 AM  Result Value Ref Range   Cholesterol 149 0 - 200 mg/dL    Comment: ATP III Classification       Desirable:  < 200 mg/dL               Borderline High:  200 - 239 mg/dL          High:  > = 240 mg/dL   Triglycerides 95.0 0.0 - 149.0 mg/dL    Comment: Normal:  <150 mg/dLBorderline High:  150 - 199 mg/dL   HDL 40.90 >39.00 mg/dL   VLDL 19.0 0.0 - 40.0 mg/dL   LDL Cholesterol 89 0 - 99 mg/dL   Total CHOL/HDL Ratio 4     Comment:                Men          Women1/2 Average Risk     3.4          3.3Average Risk          5.0          4.42X Average Risk          9.6          7.13X Average Risk          15.0          11.0                       NonHDL 107.83     Comment: NOTE:  Non-HDL goal should be 30 mg/dL higher than patient's LDL goal (i.e. LDL goal of < 70 mg/dL, would have non-HDL goal of < 100 mg/dL)    Assessment/Plan: 1. Rash and nonspecific skin eruption Concern for bites. Start permethrin cream. Start prednisone taper due to severity of itch. Supportive measures reviewed. If not improving, will need Derm assessment.  -  Hepatic function panel - permethrin (ELIMITE) 5 % cream; Apply 1 application topically once for 1 dose. Wash off 8 hours later.  Dispense: 60 g; Refill: 0 - predniSONE (DELTASONE) 10 MG tablet; Take 4 tablets (40 mg total) by mouth daily with breakfast for 2 days, THEN 3 tablets (30 mg total) daily with breakfast for 3 days, THEN 2 tablets (20 mg total) daily with breakfast for 3 days, THEN 1 tablet (10 mg total) daily with breakfast for 3 days.  Dispense: 26 tablet; Refill: 0  2. Dysfunction of left eustachian tube Start Flonase and saline nasal rinses. Daily self-insufflation recommended to open eustachian tubes. Follow-up if not improving.    Gwyndolyn Saxon  Elyn Aquas, PA-C

## 2017-07-03 ENCOUNTER — Other Ambulatory Visit: Payer: Self-pay | Admitting: Gastroenterology

## 2017-07-03 DIAGNOSIS — R1314 Dysphagia, pharyngoesophageal phase: Secondary | ICD-10-CM

## 2017-07-03 DIAGNOSIS — Z86010 Personal history of colonic polyps: Secondary | ICD-10-CM

## 2017-07-03 DIAGNOSIS — K219 Gastro-esophageal reflux disease without esophagitis: Secondary | ICD-10-CM

## 2017-07-12 ENCOUNTER — Other Ambulatory Visit (HOSPITAL_COMMUNITY): Payer: Self-pay | Admitting: *Deleted

## 2017-07-12 MED ORDER — AMLODIPINE BESYLATE 10 MG PO TABS: 10.0000 mg | ORAL_TABLET | Freq: Every day | ORAL | 3 refills | Status: DC

## 2017-07-15 ENCOUNTER — Other Ambulatory Visit: Payer: Self-pay

## 2017-07-15 ENCOUNTER — Ambulatory Visit: Payer: Medicare Other | Admitting: Physician Assistant

## 2017-07-15 ENCOUNTER — Encounter: Payer: Self-pay | Admitting: Physician Assistant

## 2017-07-15 VITALS — BP 122/62 | HR 81 | Temp 98.3°F | Resp 14 | Ht 67.0 in | Wt 180.0 lb

## 2017-07-15 DIAGNOSIS — J039 Acute tonsillitis, unspecified: Secondary | ICD-10-CM | POA: Diagnosis not present

## 2017-07-15 DIAGNOSIS — R21 Rash and other nonspecific skin eruption: Secondary | ICD-10-CM

## 2017-07-15 LAB — POCT RAPID STREP A (OFFICE): RAPID STREP A SCREEN: NEGATIVE

## 2017-07-15 MED ORDER — AMOXICILLIN 875 MG PO TABS: 875.0000 mg | ORAL_TABLET | Freq: Two times a day (BID) | ORAL | 0 refills | Status: DC

## 2017-07-15 NOTE — Patient Instructions (Signed)
Please stay well-hydrated and get plenty of rest. Continue the Flonase and start a saline rinse.  Take the antibiotic as directed with food. Repeat course of Permethrin this week. I am setting you up with Dermatology for further assessment.    Tonsillitis Tonsillitis is an infection of the throat. This infection causes the tonsils to become red, tender, and swollen. Tonsils are tissues in the back of your throat. If bacteria caused your infection, antibiotic medicine will be given to you. Sometimes, symptoms of this infection can be helped with the use of steroid medicine. If your tonsillitis is very bad (severe) and happens often, you may need to get your tonsils removed (tonsillectomy). Follow these instructions at home: Medicines  Take over-the-counter and prescription medicines only as told by your doctor.  If you were prescribed an antibiotic, take it as told by your doctor. Do not stop taking the antibiotic even if you start to feel better. Eating and drinking  Drink enough fluid to keep your pee (urine) clear or pale yellow.  While your throat is sore, eat soft or liquid foods like: ? Soup. ? Sherbert. ? Instant breakfast drinks.  Drink warm fluids.  Eat frozen ice pops. General instructions  Rest as much as possible and get plenty of sleep.  Gargle with a salt-water mixture 3-4 times a day or as needed. To make a salt-water mixture, completely dissolve -1 tsp of salt in 1 cup of warm water.  Wash your hands often with soap and water. If there is no soap and water, use hand sanitizer.  Do not share cups, bottles, or other utensils until your symptoms are gone.  Do not smoke. If you need help quitting, ask your doctor.  Keep all follow-up visits as told by your doctor. This is important. Contact a doctor if:  You have large, tender lumps in your neck.  You have a fever that does not go away after 2-3 days.  You have a rash.  You cough up green, yellow-brown, or  bloody fluid.  You cannot swallow liquids or food for 24 hours.  Only one of your tonsils is swollen. Get help right away if:  You have any new symptoms such as: ? Vomiting ? Very bad headache ? Stiff neck ? Chest pain ? Trouble breathing or swallowing  You have very bad throat pain and also have drooling or voice changes.  You have very bad pain that is not helped by medicine.  You cannot fully open your mouth.  You have redness, swelling, or severe pain anywhere in your neck. Summary  Tonsillitis causes your tonsils to be red, tender, and swollen.  While your throat is sore eat soft or liquid foods.  Gargle with a salt-water mixture 3-4 times a day or as needed.  Do not share cups, bottles, or other utensils until your symptoms are gone. This information is not intended to replace advice given to you by your health care provider. Make sure you discuss any questions you have with your health care provider. Document Released: 07/11/2007 Document Revised: 06/30/2015 Document Reviewed: 07/11/2012 Elsevier Interactive Patient Education  2017 [employer].

## 2017-07-15 NOTE — Progress Notes (Signed)
Patient presents to clinic today c/o worsening left sided ear pain with pressure and now sore throat. Denies fever, chills. Notes thick PND but denies sinus pain. Denies cough or chest congestion. Denies recent travel or sick contact. Is using his Flonase which seems to be helping per patient.   Patient also noting continued bite-like rash of his abdomen, arms and back. Notes some improvement after permethrin, but only mild improvement. Still noting itch. Is requesting referral to dermatology.   Past Medical History:  Diagnosis Date  . Aortic stenosis    mild echocardiogram 8/09 EF 60%, showed no regional wall motion abnml, mild LVH, mod focal basal septal hypertrophy and mild dyastolic dysfunction. 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 mean gradient across aortic valve was 8 mmHG. also mild L atrial enlargement and normal RV size and function. minimal AS on LHC in 7/10.   . Arthritis    "all over" (07/19/2016)  . BPH (benign prostatic hypertrophy)   . CAD (coronary artery disease)    LCH (7/10) totally occluded proximal RCA with very robuse L to R collaterals, 50% proximal LAD stenosis, EF 65%, medical management.    . Chicken pox   . Chronic lower back pain    s/p surgical fusion  . Diverticulosis   . Esophageal stricture   . GERD (gastroesophageal reflux disease)   . Heart murmur   . Hepatitis B 1984  . Hiatal hernia   . HLD (hyperlipidemia)   . HTN (hypertension)   . 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 Osteoarthritis   . TIA (transient ischemic attack) 1990s   hx  . Tubular adenoma of colon 2009    Current Outpatient Medications on File Prior to Visit  Medication Sig Dispense Refill  . amLODipine (NORVASC) 10 MG tablet Take 1 tablet (10 mg total) by mouth daily. 90 tablet 3  . aspirin 81 MG EC tablet Take 81 mg by mouth at bedtime.     Marland Kitchen atorvastatin (LIPITOR) 40 MG tablet Take 1 tablet (40 mg total) by mouth daily. 90 tablet 3  .  cholecalciferol (VITAMIN D) 1000 UNITS tablet Take 1,000 Units by mouth daily.     . iron polysaccharides (NU-IRON) 150 MG capsule Take 1 capsule (150 mg total) by mouth 2 (two) times daily.    . isosorbide mononitrate (IMDUR) 30 MG 24 hr tablet Take 1 tablet (30 mg total) by mouth at bedtime. 90 tablet 1  . lisinopril (PRINIVIL,ZESTRIL) 10 MG tablet Take 1 tablet (10 mg total) by mouth daily. 90 tablet 3  . metoprolol tartrate (LOPRESSOR) 25 MG tablet Take 0.5 tablets (12.5 mg total) by mouth 2 (two) times daily. 30 tablet 5  . Multiple Vitamins-Minerals (MULTIVITAMIN PO) Take 1 tablet by mouth daily.    . Omega-3 Fatty Acids (FISH OIL PO) Take by mouth 2 (two) times daily.    Marland Kitchen omeprazole (PRILOSEC) 20 MG capsule TAKE 1 CAPSULE(20 MG) BY MOUTH DAILY 30 capsule 0  . oxyCODONE (OXY IR/ROXICODONE) 5 MG immediate release tablet Take 1 tablet (5 mg total) by mouth every 6 (six) hours as needed for moderate pain. 60 tablet 0  . TURMERIC PO Take by mouth.     No current facility-administered medications on file prior to visit.     Allergies  Allergen Reactions  . Celebrex [Celecoxib] Hives and Itching    Family History  Problem Relation Age of Onset  . Heart disease Father 37  Living  . Coronary artery disease Father        CABG  . Alzheimer's disease Mother 85       Deceased  . Arthritis/Rheumatoid Mother   . Stomach cancer Maternal Uncle   . Brain cancer Maternal Aunt        x2  . Arthritis Brother        DJD  . Obesity Daughter        Had Bypass Sx    Social History   Socioeconomic History  . Marital status: Married    Spouse name: etta  . Number of children: 2  . Years of education: Not on file  . Highest education level: Not on file  Occupational History  . Occupation: retired    Fish farm manager: RETIRED    Comment: disabled due to back problems  Social Needs  . Financial resource strain: Not on file  . Food insecurity:    Worry: Not on file    Inability: Not on  file  . Transportation needs:    Medical: Not on file    Non-medical: Not on file  Tobacco Use  . Smoking status: Former Smoker    Packs/day: 1.50    Years: 50.00    Pack years: 75.00    Types: Cigarettes    Last attempt to quit: 06/2015    Years since quitting: 2.1  . Smokeless tobacco: Never Used  . Tobacco comment: smoked less than 1 ppd for 40+ years; Using Vapor Cig  Substance and Sexual Activity  . Alcohol use: No    Alcohol/week: 0.0 oz    Comment: 07/19/2016 "might have a few drinks/year"  . Drug use: No  . Sexual activity: Never  Lifestyle  . Physical activity:    Days per week: Not on file    Minutes per session: Not on file  . Stress: Not on file  Relationships  . Social connections:    Talks on phone: Not on file    Gets together: Not on file    Attends religious service: Not on file    Active member of club or organization: Not on file    Attends meetings of clubs or organizations: Not on file    Relationship status: Not on file  Other Topics Concern  . Not on file  Social History Narrative   Married (3rd), [place]. 2 children from 1st marriage, 4 step children.    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       Cell # [id number]   Review of Systems - See HPI.  All other ROS are negative.  BP 122/62   Pulse 81   Temp 98.3 F (36.8 C) (Oral)   Resp 14   Ht 5\' 7"  (1.702 m)   Wt 180 lb (81.6 kg)   SpO2 97%   BMI 28.19 kg/m   Physical Exam  Constitutional: He appears well-developed and well-nourished.  HENT:  Head: Normocephalic and atraumatic.  Right Ear: External ear normal. Tympanic membrane is erythematous and bulging. A middle ear effusion (serous) is present.  Left Ear: External ear normal. A middle ear effusion (serous) is present.  Nose: No mucosal edema or rhinorrhea. Right sinus exhibits no maxillary sinus tenderness and no frontal sinus tenderness. Left sinus  exhibits no maxillary sinus tenderness and no frontal sinus tenderness.  Mouth/Throat: Uvula is midline. Oropharyngeal exudate and posterior oropharyngeal erythema present. No tonsillar  abscesses. Tonsils are 1+ on the right. Tonsils are 2+ on the left.  Eyes: Conjunctivae are normal.  Neck: Neck supple.  Cardiovascular: Normal rate, regular rhythm and normal heart sounds.  Pulmonary/Chest: Effort normal and breath sounds normal.  Lymphadenopathy:    He has cervical adenopathy (left-sided tonsillar adenopathy).  Vitals reviewed.   Recent Results (from the past 2160 hour(s))  Basic metabolic panel     Status: Abnormal   Collection Time: 05/30/17 10:54 AM  Result Value Ref Range   Sodium 139 135 - 145 mEq/L   Potassium 3.9 3.5 - 5.1 mEq/L   Chloride 104 96 - 112 mEq/L   CO2 28 19 - 32 mEq/L   Glucose, Bld 141 (H) 70 - 99 mg/dL   BUN 9 6 - 23 mg/dL   Creatinine, Ser 0.67 0.40 - 1.50 mg/dL   Calcium 8.8 8.4 - 10.5 mg/dL   GFR 123.02 >60.00 mL/min  Lipid Profile     Status: None   Collection Time: 05/30/17 10:54 AM  Result Value Ref Range   Cholesterol 149 0 - 200 mg/dL    Comment: ATP III Classification       Desirable:  < 200 mg/dL               Borderline High:  200 - 239 mg/dL          High:  > = 240 mg/dL   Triglycerides 95.0 0.0 - 149.0 mg/dL    Comment: Normal:  <150 mg/dLBorderline High:  150 - 199 mg/dL   HDL 40.90 >39.00 mg/dL   VLDL 19.0 0.0 - 40.0 mg/dL   LDL Cholesterol 89 0 - 99 mg/dL   Total CHOL/HDL Ratio 4     Comment:                Men          Women1/2 Average Risk     3.4          3.3Average Risk          5.0          4.42X Average Risk          9.6          7.13X Average Risk          15.0          11.0                       NonHDL 107.83     Comment: NOTE:  Non-HDL goal should be 30 mg/dL higher than patient's LDL goal (i.e. LDL goal of < 70 mg/dL, would have non-HDL goal of < 100 mg/dL)  Hepatic function panel     Status: Abnormal   Collection Time: 06/24/17   1:41 PM  Result Value Ref Range   Total Bilirubin 0.7 0.2 - 1.2 mg/dL   Bilirubin, Direct 0.2 0.0 - 0.3 mg/dL   Alkaline Phosphatase 150 (H) 39 - 117 U/L   AST 22 0 - 37 U/L   ALT 33 0 - 53 U/L   Total Protein 6.9 6.0 - 8.3 g/dL   Albumin 4.0 3.5 - 5.2 g/dL  POCT rapid strep A     Status: Normal   Collection Time: 07/15/17 10:32 AM  Result Value Ref Range   Rapid Strep A Screen Negative Negative    Assessment/Plan: 1. Rash and nonspecific skin eruption Referral to Dermatology placed per patient preference. Supportive measures reviewed.  -  Ambulatory referral to Dermatology  2. Tonsillitis Rapid strep negative. Will send for culture. L-sided swelling and exudate of tonsil. Also start of AOM. Start Amoxicillin. Supportive measures and OTC medications reviewed. - amoxicillin (AMOXIL) 875 MG tablet; Take 1 tablet (875 mg total) by mouth 2 (two) times daily.  Dispense: 20 tablet; Refill: 0 - POCT rapid strep A - Culture, Group A Strep   Leeanne Rio, PA-C

## 2017-07-17 LAB — CULTURE, GROUP A STREP
MICRO NUMBER: 90694515
SPECIMEN QUALITY:: ADEQUATE

## 2017-07-18 ENCOUNTER — Ambulatory Visit (HOSPITAL_COMMUNITY)
Admission: RE | Admit: 2017-07-18 | Discharge: 2017-07-18 | Disposition: A | Discharge status: Home / Self Care | Payer: Medicare Other | Source: Ambulatory Visit | Attending: Cardiology | Admitting: Cardiology

## 2017-07-18 DIAGNOSIS — I350 Nonrheumatic aortic (valve) stenosis: Secondary | ICD-10-CM | POA: Insufficient documentation

## 2017-07-18 DIAGNOSIS — I5189 Other ill-defined heart diseases: Secondary | ICD-10-CM | POA: Diagnosis not present

## 2017-07-18 NOTE — Progress Notes (Signed)
  Echocardiogram 2D Echocardiogram has been performed.  Brandon [name] 07/18/2017, 12:10 PM

## 2017-07-19 ENCOUNTER — Telehealth: Payer: Self-pay | Admitting: Physician Assistant

## 2017-07-19 ENCOUNTER — Telehealth: Payer: Self-pay | Admitting: General Practice

## 2017-07-19 DIAGNOSIS — M8949 Other hypertrophic osteoarthropathy, multiple sites: Secondary | ICD-10-CM

## 2017-07-19 DIAGNOSIS — M159 Polyosteoarthritis, unspecified: Secondary | ICD-10-CM

## 2017-07-19 DIAGNOSIS — M150 Primary generalized (osteo)arthritis: Principal | ICD-10-CM

## 2017-07-19 MED ORDER — OXYCODONE HCL 5 MG PO TABS: 5.0000 mg | ORAL_TABLET | Freq: Four times a day (QID) | ORAL | 0 refills | Status: DC | PRN

## 2017-07-19 NOTE — Telephone Encounter (Signed)
Entered in error.Marland Kitchen Another phone note already made.   Copied from [place] [phone]. Topic: Inquiry >> Jul 19, 2017  8:07 AM Pricilla Handler wrote: Reason for CRM: Patient called requesting a refill of OxyCODONE (OXY IR/ROXICODONE) 5 MG immediate release tablet. Patient's preferred pharmacy is [facility] [phone], [place] [phone] (Phone)  [phone] (Fax).       Thank You!!!

## 2017-07-19 NOTE — Telephone Encounter (Signed)
Copied from [place] [phone]. Topic: Inquiry >> Jul 19, 2017  8:07 AM Pricilla Handler wrote: Reason for CRM: Patient called requesting a refill of OxyCODONE (OXY IR/ROXICODONE) 5 MG immediate release tablet. Patient's preferred pharmacy is [facility] [phone] [address], [place] [phone] (Phone)  [phone] (Fax).       Thank You!!!

## 2017-07-19 NOTE — Telephone Encounter (Signed)
Last OV 07/15/17 Oxycodone last filled 06/11/17 #60 with 0

## 2017-07-26 DIAGNOSIS — L308 Other specified dermatitis: Secondary | ICD-10-CM | POA: Diagnosis not present

## 2017-07-30 ENCOUNTER — Other Ambulatory Visit: Payer: Self-pay | Admitting: Gastroenterology

## 2017-07-30 DIAGNOSIS — Z86010 Personal history of colonic polyps: Secondary | ICD-10-CM

## 2017-07-30 DIAGNOSIS — K219 Gastro-esophageal reflux disease without esophagitis: Secondary | ICD-10-CM

## 2017-07-30 DIAGNOSIS — R1314 Dysphagia, pharyngoesophageal phase: Secondary | ICD-10-CM

## 2017-08-14 ENCOUNTER — Other Ambulatory Visit: Payer: Self-pay | Admitting: Emergency Medicine

## 2017-08-14 ENCOUNTER — Encounter: Payer: Self-pay | Admitting: Physician Assistant

## 2017-08-14 ENCOUNTER — Ambulatory Visit: Payer: Medicare Other | Admitting: Physician Assistant

## 2017-08-14 ENCOUNTER — Other Ambulatory Visit: Payer: Self-pay

## 2017-08-14 VITALS — BP 122/60 | HR 76 | Temp 98.0°F | Resp 14 | Ht 67.0 in | Wt 179.0 lb

## 2017-08-14 DIAGNOSIS — H6982 Other specified disorders of Eustachian tube, left ear: Secondary | ICD-10-CM | POA: Diagnosis not present

## 2017-08-14 DIAGNOSIS — L853 Xerosis cutis: Secondary | ICD-10-CM

## 2017-08-14 DIAGNOSIS — M150 Primary generalized (osteo)arthritis: Principal | ICD-10-CM

## 2017-08-14 DIAGNOSIS — B86 Scabies: Secondary | ICD-10-CM

## 2017-08-14 DIAGNOSIS — M8949 Other hypertrophic osteoarthropathy, multiple sites: Secondary | ICD-10-CM

## 2017-08-14 DIAGNOSIS — M159 Polyosteoarthritis, unspecified: Secondary | ICD-10-CM

## 2017-08-14 MED ORDER — TRIAMCINOLONE ACETONIDE 0.1 % EX CREA: 1.0000 "application " | TOPICAL_CREAM | Freq: Two times a day (BID) | CUTANEOUS | 0 refills | Status: DC

## 2017-08-14 MED ORDER — MAGIC MOUTHWASH: 5.0000 mL | Freq: Three times a day (TID) | ORAL | 0 refills | Status: DC | PRN

## 2017-08-14 MED ORDER — OXYCODONE HCL 5 MG PO TABS: 5.0000 mg | ORAL_TABLET | Freq: Four times a day (QID) | ORAL | 0 refills | Status: DC | PRN

## 2017-08-14 NOTE — Patient Instructions (Signed)
Please keep well-hydrated and get plenty of rest. I do not see any sign of new scabies lesions. Seems the treatment has worked but has dried out the skin quite a bit. Please try to refrain from scratching the area.  Start a daily moisturizer -- Eucerin, Cerave, Aquaphor -- to help hydrate the skin. Also can start OTC Sarna lotion to help with any residual itch. I have given you a topical steroid cream to apply to the really dry patches of the arms and torso 1-2 x day over the next week. If things are not continuing to resolve, I will need you to contact the Dermatologist.   Please continue the Flonase nasal spray. Start the [facility] as directed -- gargle with this and spit.  If symptoms are not continuing to resolve over the weekend, let me know.

## 2017-08-14 NOTE — Progress Notes (Signed)
Patient presents to clinic today to follow-up on scabies rash as well as to discuss some residual L ear pressure and discomfort.   Patient initially seen for rash at the end of May. At that time there was concern for contact dermatitis so patient was started on prednisone taper with some initial improvement. Symptoms then began worsening while on treatment so patient seen for repeat assessment. Felt likely scabies and patient was started on Permethrin and set up with Dermatology. Patient did not take his scabies treatment as directed. Dermatologist agrees rash was consistent with scabies and encouraged patient to complete to courses of the Elimite. Patient notes doing as instructed. Notes improvement in the bites. Itch is mostly resolved and lesions are healing, but he has noted some dry patches on his skin.  In regards to the L ear pain. Patient seen on 07/15/17 and diagnosed with tonsillitis and eustachian tube dysfunction of L ear. Was given course of amoxicillin. Notes taking as directed with resolution of severe throat pain. Is also taking Flonase as directed. Still noting mild occasional pressure, popping and pain of L ear.  Past Medical History:  Diagnosis Date  . Aortic stenosis    mild echocardiogram 8/09 EF 60%, showed no regional wall motion abnml, mild LVH, mod focal basal septal hypertrophy and mild dyastolic dysfunction. 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 mean gradient across aortic valve was 8 mmHG. also mild L atrial enlargement and normal RV size and function. minimal AS on LHC in 7/10.   . Arthritis    "all over" (07/19/2016)  . BPH (benign prostatic hypertrophy)   . CAD (coronary artery disease)    LCH (7/10) totally occluded proximal RCA with very robuse L to R collaterals, 50% proximal LAD stenosis, EF 65%, medical management.    . Chicken pox   . Chronic lower back pain    s/p surgical fusion  . Diverticulosis   . Esophageal  stricture   . GERD (gastroesophageal reflux disease)   . Heart murmur   . Hepatitis B 1984  . Hiatal hernia   . HLD (hyperlipidemia)   . HTN (hypertension)   . 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 Osteoarthritis   . TIA (transient ischemic attack) 1990s   hx  . Tubular adenoma of colon 2009    Current Outpatient Medications on File Prior to Visit  Medication Sig Dispense Refill  . amLODipine (NORVASC) 10 MG tablet Take 1 tablet (10 mg total) by mouth daily. 90 tablet 3  . aspirin 81 MG EC tablet Take 81 mg by mouth at bedtime.     Marland Kitchen atorvastatin (LIPITOR) 40 MG tablet Take 1 tablet (40 mg total) by mouth daily. 90 tablet 3  . cholecalciferol (VITAMIN D) 1000 UNITS tablet Take 1,000 Units by mouth daily.     . iron polysaccharides (NU-IRON) 150 MG capsule Take 1 capsule (150 mg total) by mouth 2 (two) times daily.    . isosorbide mononitrate (IMDUR) 30 MG 24 hr tablet Take 1 tablet (30 mg total) by mouth at bedtime. 90 tablet 1  . lisinopril (PRINIVIL,ZESTRIL) 10 MG tablet Take 1 tablet (10 mg total) by mouth daily. 90 tablet 3  . metoprolol tartrate (LOPRESSOR) 25 MG tablet Take 0.5 tablets (12.5 mg total) by mouth 2 (two) times daily. 30 tablet 5  . Multiple Vitamins-Minerals (MULTIVITAMIN PO) Take 1 tablet by mouth daily.    . Omega-3 Fatty Acids (FISH OIL PO) Take by mouth  2 (two) times daily.    Marland Kitchen omeprazole (PRILOSEC) 20 MG capsule TAKE ONE CAPSULE BY MOUTH EVERY DAY 30 capsule 0  . oxyCODONE (OXY IR/ROXICODONE) 5 MG immediate release tablet Take 1 tablet (5 mg total) by mouth every 6 (six) hours as needed for moderate pain. 60 tablet 0  . TURMERIC PO Take by mouth.     No current facility-administered medications on file prior to visit.     Allergies  Allergen Reactions  . Celebrex [Celecoxib] Hives and Itching    Family History  Problem Relation Age of Onset  . Heart disease Father 16       Living  . Coronary artery disease Father        CABG  . Alzheimer's disease Mother 42        Deceased  . Arthritis/Rheumatoid Mother   . Stomach cancer Maternal Uncle   . Brain cancer Maternal Aunt        x2  . Arthritis Brother        DJD  . Obesity Daughter        Had Bypass Sx    Social History   Socioeconomic History  . Marital status: Married    Spouse name: etta  . Number of children: 2  . Years of education: Not on file  . Highest education level: Not on file  Occupational History  . Occupation: retired    Fish farm manager: RETIRED    Comment: disabled due to back problems  Social Needs  . Financial resource strain: Not on file  . Food insecurity:    Worry: Not on file    Inability: Not on file  . Transportation needs:    Medical: Not on file    Non-medical: Not on file  Tobacco Use  . Smoking status: Former Smoker    Packs/day: 1.50    Years: 50.00    Pack years: 75.00    Types: Cigarettes    Last attempt to quit: 06/2015    Years since quitting: 2.1  . Smokeless tobacco: Never Used  . Tobacco comment: smoked less than 1 ppd for 40+ years; Using Vapor Cig  Substance and Sexual Activity  . Alcohol use: No    Alcohol/week: 0.0 oz    Comment: 07/19/2016 "might have a few drinks/year"  . Drug use: No  . Sexual activity: Never  Lifestyle  . Physical activity:    Days per week: Not on file    Minutes per session: Not on file  . Stress: Not on file  Relationships  . Social connections:    Talks on phone: Not on file    Gets together: Not on file    Attends religious service: Not on file    Active member of club or organization: Not on file    Attends meetings of clubs or organizations: Not on file    Relationship status: Not on file  Other Topics Concern  . Not on file  Social History Narrative   Married (3rd), [place]. 2 children from 1st marriage, 4 step children.    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       Cell # [id number]   Review of Systems - See  HPI.  All other ROS are negative.  BP 122/60   Pulse 76   Temp 98 F (36.7 C) (Oral)   Resp 14   Ht 5\' 7"  (1.702 m)  Wt 179 lb (81.2 kg)   SpO2 98%   BMI 28.04 kg/m   Physical Exam  Constitutional: He appears well-developed and well-nourished.  HENT:  Head: Normocephalic and atraumatic.  Right Ear: External ear normal.  Left Ear: External ear normal. A middle ear effusion (serous) is present.  Nose: Nose normal.  Mouth/Throat: Oropharynx is clear and moist.  Cardiovascular: Normal rate, regular rhythm, normal heart sounds and intact distal pulses.  Pulmonary/Chest: Effort normal and breath sounds normal. No stridor. No respiratory distress. He has no wheezes. He has no rales. He exhibits no tenderness.  Skin: Rash noted.  Xerotic patches of skin noted of torso, chest and upper arms as well. Lesions of scabies are scabbing well. No sign of new lesion per patient. No evidence of excessive excoriation or superimposed cellulitis.  Psychiatric: He has a normal mood and affect.  Vitals reviewed.  Recent Results (from the past 2160 hour(s))  Basic metabolic panel     Status: Abnormal   Collection Time: 05/30/17 10:54 AM  Result Value Ref Range   Sodium 139 135 - 145 mEq/L   Potassium 3.9 3.5 - 5.1 mEq/L   Chloride 104 96 - 112 mEq/L   CO2 28 19 - 32 mEq/L   Glucose, Bld 141 (H) 70 - 99 mg/dL   BUN 9 6 - 23 mg/dL   Creatinine, Ser 0.67 0.40 - 1.50 mg/dL   Calcium 8.8 8.4 - 10.5 mg/dL   GFR 123.02 >60.00 mL/min  Lipid Profile     Status: None   Collection Time: 05/30/17 10:54 AM  Result Value Ref Range   Cholesterol 149 0 - 200 mg/dL    Comment: ATP III Classification       Desirable:  < 200 mg/dL               Borderline High:  200 - 239 mg/dL          High:  > = 240 mg/dL   Triglycerides 95.0 0.0 - 149.0 mg/dL    Comment: Normal:  <150 mg/dLBorderline High:  150 - 199 mg/dL   HDL 40.90 >39.00 mg/dL   VLDL 19.0 0.0 - 40.0 mg/dL   LDL Cholesterol 89 0 - 99 mg/dL   Total  CHOL/HDL Ratio 4     Comment:                Men          Women1/2 Average Risk     3.4          3.3Average Risk          5.0          4.42X Average Risk          9.6          7.13X Average Risk          15.0          11.0                       NonHDL 107.83     Comment: NOTE:  Non-HDL goal should be 30 mg/dL higher than patient's LDL goal (i.e. LDL goal of < 70 mg/dL, would have non-HDL goal of < 100 mg/dL)  Hepatic function panel     Status: Abnormal   Collection Time: 06/24/17  1:41 PM  Result Value Ref Range   Total Bilirubin 0.7 0.2 - 1.2 mg/dL   Bilirubin, Direct 0.2 0.0 - 0.3 mg/dL  Alkaline Phosphatase 150 (H) 39 - 117 U/L   AST 22 0 - 37 U/L   ALT 33 0 - 53 U/L   Total Protein 6.9 6.0 - 8.3 g/dL   Albumin 4.0 3.5 - 5.2 g/dL  POCT rapid strep A     Status: Normal   Collection Time: 07/15/17 10:32 AM  Result Value Ref Range   Rapid Strep A Screen Negative Negative  Culture, Group A Strep     Status: None   Collection Time: 07/15/17 10:32 AM  Result Value Ref Range   MICRO NUMBER: [id number]    SPECIMEN QUALITY: ADEQUATE    SOURCE: NOT GIVEN    STATUS: FINAL    RESULT: No group A Streptococcus isolated    Assessment/Plan: 1. Scabies Resolving. Discussed it will take time for all of the lesions to completely resolve. He is to monitor for any new areas.  2. Xerosis of skin Secondary to multiple Permethrin treatments. Start Kenalog to the most troublesome areas. Start daily moisturizer. Follow-up with Dermatology if not resolving.   3. Eustachian tube dysfunction, left Continue Flonase as directed. OTC medications reviewed. Will give small amount of magic mouthwash for some residual throat pain. Follow-up if not completely resolving.   Leeanne Rio, PA-C

## 2017-08-14 NOTE — Telephone Encounter (Signed)
Indication for chronic opioid: Osteoarthritis and Multiple joints Medication and dose: Oxycodone IR 5 mg # pills per month: # 40 Last UDS date: 12/05/2016 low risk Opioid Treatment Agreement signed (Y/N): Yes 05/30/17 Opioid Treatment Agreement last reviewed with patient:   [facility] reviewed this encounter (include red flags):     Last filled on 07/19/17

## 2017-08-26 ENCOUNTER — Other Ambulatory Visit: Payer: Self-pay | Admitting: Gastroenterology

## 2017-08-26 DIAGNOSIS — K219 Gastro-esophageal reflux disease without esophagitis: Secondary | ICD-10-CM

## 2017-08-26 DIAGNOSIS — R1314 Dysphagia, pharyngoesophageal phase: Secondary | ICD-10-CM

## 2017-08-26 DIAGNOSIS — Z86010 Personal history of colonic polyps: Secondary | ICD-10-CM

## 2017-08-29 DIAGNOSIS — L308 Other specified dermatitis: Secondary | ICD-10-CM | POA: Diagnosis not present

## 2017-08-29 DIAGNOSIS — B86 Scabies: Secondary | ICD-10-CM | POA: Diagnosis not present

## 2017-09-06 ENCOUNTER — Encounter: Payer: Self-pay | Admitting: Physician Assistant

## 2017-09-06 ENCOUNTER — Other Ambulatory Visit: Payer: Self-pay

## 2017-09-06 ENCOUNTER — Ambulatory Visit: Payer: Medicare Other | Admitting: Physician Assistant

## 2017-09-06 VITALS — BP 128/74 | HR 73 | Temp 98.3°F | Resp 16 | Ht 67.0 in | Wt 179.4 lb

## 2017-09-06 DIAGNOSIS — M150 Primary generalized (osteo)arthritis: Secondary | ICD-10-CM

## 2017-09-06 DIAGNOSIS — M159 Polyosteoarthritis, unspecified: Secondary | ICD-10-CM

## 2017-09-06 DIAGNOSIS — J029 Acute pharyngitis, unspecified: Secondary | ICD-10-CM | POA: Diagnosis not present

## 2017-09-06 DIAGNOSIS — M8949 Other hypertrophic osteoarthropathy, multiple sites: Secondary | ICD-10-CM

## 2017-09-06 MED ORDER — AMOXICILLIN 875 MG PO TABS: 875.0000 mg | ORAL_TABLET | Freq: Two times a day (BID) | ORAL | 0 refills | Status: DC

## 2017-09-06 MED ORDER — OXYCODONE HCL 5 MG PO TABS: 5.0000 mg | ORAL_TABLET | Freq: Four times a day (QID) | ORAL | 0 refills | Status: DC | PRN

## 2017-09-06 NOTE — Progress Notes (Signed)
Patient presents to clinic today c/o continued sore throat. Patient was seen previously for this issue, diagnosed with pharyngitis and tonsillitis and was treatment with amoxicillin with significant improvement in symptoms. Has noted a twinge of pain in the left side of his throat daily since then. Denies dysphagia but notes occasional odynophagia. Denies change in voice. Denies URI symptoms. Denies fever, chills,nausea or vomiting. No pain with chewing or ROM of jaw..   Past Medical History:  Diagnosis Date  . Aortic stenosis    mild echocardiogram 8/09 EF 60%, showed no regional wall motion abnml, mild LVH, mod focal basal septal hypertrophy and mild dyastolic dysfunction. 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 mean gradient across aortic valve was 8 mmHG. also mild L atrial enlargement and normal RV size and function. minimal AS on LHC in 7/10.   . Arthritis    "all over" (07/19/2016)  . BPH (benign prostatic hypertrophy)   . CAD (coronary artery disease)    LCH (7/10) totally occluded proximal RCA with very robuse L to R collaterals, 50% proximal LAD stenosis, EF 65%, medical management.    . Chicken pox   . Chronic lower back pain    s/p surgical fusion  . Diverticulosis   . Esophageal stricture   . GERD (gastroesophageal reflux disease)   . Heart murmur   . Hepatitis B 1984  . Hiatal hernia   . HLD (hyperlipidemia)   . HTN (hypertension)   . 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 Osteoarthritis   . TIA (transient ischemic attack) 1990s   hx  . Tubular adenoma of colon 2009    Current Outpatient Medications on File Prior to Visit  Medication Sig Dispense Refill  . amLODipine (NORVASC) 10 MG tablet Take 1 tablet (10 mg total) by mouth daily. 90 tablet 3  . aspirin 81 MG EC tablet Take 81 mg by mouth at bedtime.     Marland Kitchen atorvastatin (LIPITOR) 40 MG tablet Take 1 tablet (40 mg total) by mouth daily. 90 tablet 3  . cholecalciferol (VITAMIN D)  1000 UNITS tablet Take 1,000 Units by mouth daily.     . iron polysaccharides (NU-IRON) 150 MG capsule Take 1 capsule (150 mg total) by mouth 2 (two) times daily.    . isosorbide mononitrate (IMDUR) 30 MG 24 hr tablet Take 1 tablet (30 mg total) by mouth at bedtime. 90 tablet 1  . lisinopril (PRINIVIL,ZESTRIL) 10 MG tablet Take 1 tablet (10 mg total) by mouth daily. 90 tablet 3  . magic mouthwash SOLN Take 5 mLs by mouth 3 (three) times daily as needed for mouth pain. 100 mL 0  . metoprolol tartrate (LOPRESSOR) 25 MG tablet Take 0.5 tablets (12.5 mg total) by mouth 2 (two) times daily. 30 tablet 5  . Multiple Vitamins-Minerals (MULTIVITAMIN PO) Take 1 tablet by mouth daily.    . Omega-3 Fatty Acids (FISH OIL PO) Take by mouth 2 (two) times daily.    Marland Kitchen omeprazole (PRILOSEC) 20 MG capsule TAKE ONE CAPSULE BY MOUTH EVERY DAY 30 capsule 0  . oxyCODONE (OXY IR/ROXICODONE) 5 MG immediate release tablet Take 1 tablet (5 mg total) by mouth every 6 (six) hours as needed for moderate pain. 60 tablet 0  . permethrin (ELIMITE) 5 % cream   2  . triamcinolone cream (KENALOG) 0.1 % Apply 1 application topically 2 (two) times daily. 30 g 0  . TURMERIC PO Take by mouth.     No  current facility-administered medications on file prior to visit.     Allergies  Allergen Reactions  . Celebrex [Celecoxib] Hives and Itching    Family History  Problem Relation Age of Onset  . Heart disease Father 44       Living  . Coronary artery disease Father        CABG  . Alzheimer's disease Mother 25       Deceased  . Arthritis/Rheumatoid Mother   . Stomach cancer Maternal Uncle   . Brain cancer Maternal Aunt        x2  . Arthritis Brother        DJD  . Obesity Daughter        Had Bypass Sx    Social History   Socioeconomic History  . Marital status: Married    Spouse name: etta  . Number of children: 2  . Years of education: Not on file  . Highest education level: Not on file  Occupational History  .  Occupation: retired    Fish farm manager: RETIRED    Comment: disabled due to back problems  Social Needs  . Financial resource strain: Not on file  . Food insecurity:    Worry: Not on file    Inability: Not on file  . Transportation needs:    Medical: Not on file    Non-medical: Not on file  Tobacco Use  . Smoking status: Former Smoker    Packs/day: 1.50    Years: 50.00    Pack years: 75.00    Types: Cigarettes    Last attempt to quit: 06/2015    Years since quitting: 2.2  . Smokeless tobacco: Never Used  . Tobacco comment: smoked less than 1 ppd for 40+ years; Using Vapor Cig  Substance and Sexual Activity  . Alcohol use: No    Alcohol/week: 0.0 oz    Comment: 07/19/2016 "might have a few drinks/year"  . Drug use: No  . Sexual activity: Never  Lifestyle  . Physical activity:    Days per week: Not on file    Minutes per session: Not on file  . Stress: Not on file  Relationships  . Social connections:    Talks on phone: Not on file    Gets together: Not on file    Attends religious service: Not on file    Active member of club or organization: Not on file    Attends meetings of clubs or organizations: Not on file    Relationship status: Not on file  Other Topics Concern  . Not on file  Social History Narrative   Married (3rd), [place]. 2 children from 1st marriage, 4 step children.    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       Cell # [id number]    Review of Systems - See HPI.  All other ROS are negative.  BP 128/74   Pulse 73   Temp 98.3 F (36.8 C) (Oral)   Resp 16   Ht 5\' 7"  (1.702 m)   Wt 179 lb 6.4 oz (81.4 kg)   SpO2 95%   BMI 28.10 kg/m   Physical Exam  Constitutional: He appears well-developed and well-nourished.  HENT:  Head: Normocephalic and atraumatic.  Right Ear: Tympanic membrane normal.  Left Ear: Tympanic membrane normal.  Mouth/Throat: Uvula is midline,  oropharynx is clear and moist and mucous membranes are normal. Tonsils  are 0 on the right. Tonsils are 0 on the left.  Eyes: Pupils are equal, round, and reactive to light.  Neck: Neck supple.  Cardiovascular: Normal rate, regular rhythm and normal heart sounds.  Pulmonary/Chest: Effort normal and breath sounds normal.  Lymphadenopathy:    He has no cervical adenopathy.  Neurological: He is alert.  Vitals reviewed.   Recent Results (from the past 2160 hour(s))  Hepatic function panel     Status: Abnormal   Collection Time: 06/24/17  1:41 PM  Result Value Ref Range   Total Bilirubin 0.7 0.2 - 1.2 mg/dL   Bilirubin, Direct 0.2 0.0 - 0.3 mg/dL   Alkaline Phosphatase 150 (H) 39 - 117 U/L   AST 22 0 - 37 U/L   ALT 33 0 - 53 U/L   Total Protein 6.9 6.0 - 8.3 g/dL   Albumin 4.0 3.5 - 5.2 g/dL  POCT rapid strep A     Status: Normal   Collection Time: 07/15/17 10:32 AM  Result Value Ref Range   Rapid Strep A Screen Negative Negative  Culture, Group A Strep     Status: None   Collection Time: 07/15/17 10:32 AM  Result Value Ref Range   MICRO NUMBER: [id number]    SPECIMEN QUALITY: ADEQUATE    SOURCE: NOT GIVEN    STATUS: FINAL    RESULT: No group A Streptococcus isolated     Assessment/Plan: 1. Pharyngitis, unspecified etiology Chronic. No sign of tonsillitis. Discussed recommendation of ENT assessment. He is adamant on repeat course of Amoxicillin. Will Rx 875 mg BID x 10 days. If not resolving, he will proceed with ENT. - amoxicillin (AMOXIL) 875 MG tablet; Take 1 tablet (875 mg total) by mouth 2 (two) times daily.  Dispense: 20 tablet; Refill: 0  2. Primary osteoarthritis involving multiple joints Refill given. He is coming due for pain management follow-up. He is to schedule next month.  - oxyCODONE (OXY IR/ROXICODONE) 5 MG immediate release tablet; Take 1 tablet (5 mg total) by mouth every 6 (six) hours as needed for moderate pain.  Dispense: 60 tablet; Refill: 0   Leeanne Rio, PA-C

## 2017-09-06 NOTE — Patient Instructions (Signed)
Please keep well-hydrated. Take the antibiotic with food.  Keep up with regular regimen.  If not improving we will need to set you up with ENT.

## 2017-09-16 ENCOUNTER — Telehealth: Payer: Self-pay

## 2017-09-16 DIAGNOSIS — H6982 Other specified disorders of Eustachian tube, left ear: Secondary | ICD-10-CM

## 2017-09-16 DIAGNOSIS — J029 Acute pharyngitis, unspecified: Secondary | ICD-10-CM

## 2017-09-16 NOTE — Telephone Encounter (Signed)
Ok to place referral.

## 2017-09-16 NOTE — Telephone Encounter (Signed)
Last OV 09/06/17. Please advise if there is a certain ENT that needs to be referred for patient. Thank you!  Copied from [place] [phone]. Topic: Referral - Request >> Sep 16, 2017  1:40 PM Brandon [name] wrote: Reason for CRM:   Patient is still experiencing pain swallowing and some sharp pain occasionally in his left ear.  So he would like the provider to set up a referral to a [facility] ASAP. Patient does not have a preference as to which ENT Specialist he sees. Just make sure he is in [place].

## 2017-09-16 NOTE — Telephone Encounter (Signed)
Referral has been placed. 

## 2017-09-18 ENCOUNTER — Telehealth: Payer: Self-pay | Admitting: Physician Assistant

## 2017-09-18 DIAGNOSIS — I714 Abdominal aortic aneurysm, without rupture, unspecified: Secondary | ICD-10-CM

## 2017-09-18 NOTE — Telephone Encounter (Signed)
Patient is due for repeat US for his AAA. I have placed an order for this. He will be contacted to schedule.

## 2017-09-19 NOTE — Telephone Encounter (Signed)
Routed order/referral for scheduling.

## 2017-09-20 DIAGNOSIS — K1379 Other lesions of oral mucosa: Secondary | ICD-10-CM | POA: Diagnosis not present

## 2017-09-20 DIAGNOSIS — F17210 Nicotine dependence, cigarettes, uncomplicated: Secondary | ICD-10-CM | POA: Diagnosis not present

## 2017-09-20 DIAGNOSIS — C090 Malignant neoplasm of tonsillar fossa: Secondary | ICD-10-CM | POA: Insufficient documentation

## 2017-09-20 DIAGNOSIS — J342 Deviated nasal septum: Secondary | ICD-10-CM | POA: Diagnosis not present

## 2017-09-20 DIAGNOSIS — H6982 Other specified disorders of Eustachian tube, left ear: Secondary | ICD-10-CM | POA: Diagnosis not present

## 2017-09-23 ENCOUNTER — Ambulatory Visit (INDEPENDENT_AMBULATORY_CARE_PROVIDER_SITE_OTHER): Payer: Medicare Other | Admitting: Physician Assistant

## 2017-09-23 ENCOUNTER — Other Ambulatory Visit: Payer: Self-pay | Admitting: Otolaryngology

## 2017-09-23 ENCOUNTER — Encounter: Payer: Self-pay | Admitting: Emergency Medicine

## 2017-09-23 ENCOUNTER — Other Ambulatory Visit: Payer: Medicare Other

## 2017-09-23 ENCOUNTER — Encounter: Payer: Self-pay | Admitting: Physician Assistant

## 2017-09-23 ENCOUNTER — Other Ambulatory Visit: Payer: Self-pay

## 2017-09-23 ENCOUNTER — Ambulatory Visit: Payer: Medicare Other | Admitting: Physician Assistant

## 2017-09-23 VITALS — BP 118/64 | HR 94 | Temp 98.2°F | Resp 16 | Ht 67.0 in | Wt 184.0 lb

## 2017-09-23 DIAGNOSIS — M8949 Other hypertrophic osteoarthropathy, multiple sites: Secondary | ICD-10-CM

## 2017-09-23 DIAGNOSIS — G8929 Other chronic pain: Secondary | ICD-10-CM

## 2017-09-23 DIAGNOSIS — M150 Primary generalized (osteo)arthritis: Secondary | ICD-10-CM

## 2017-09-23 DIAGNOSIS — F419 Anxiety disorder, unspecified: Secondary | ICD-10-CM | POA: Diagnosis not present

## 2017-09-23 DIAGNOSIS — M159 Polyosteoarthritis, unspecified: Secondary | ICD-10-CM

## 2017-09-23 DIAGNOSIS — C099 Malignant neoplasm of tonsil, unspecified: Secondary | ICD-10-CM

## 2017-09-23 MED ORDER — OXYCODONE HCL 5 MG PO TABS: 5.0000 mg | ORAL_TABLET | Freq: Four times a day (QID) | ORAL | 0 refills | Status: DC | PRN

## 2017-09-23 MED ORDER — CLONAZEPAM 0.5 MG PO TABS: 0.5000 mg | ORAL_TABLET | Freq: Two times a day (BID) | ORAL | 0 refills | Status: DC | PRN

## 2017-09-23 NOTE — Patient Instructions (Signed)
Please call ENT office to get an update on your CT scan.  I have refilled your pain medication to your pharmacy. Use the Klonopin as directed only when needed for severe acute anxiety.

## 2017-09-23 NOTE — Progress Notes (Signed)
Reviewed and updated  today  Indication for chronic opioid: Chronic Back Pain, Recent Diagnosis of cancer of the tongue Medication and dose: Oxycodone 5 mg # pills per month: 60 Last UDS date: 09/23/17 Pain contract signed (date): Updated today. Date narcotic database last reviewed (include red flags): 09/23/17  Pain Inventory (1-10 worse): Average Pain 5-6 Pain Right Now 4 My pain is constant, dull (character i.e. sharp, stabbing, dull, constant etc)  Pain is worse with: prolonged positioning.  Relief from Meds: Good  In the last 24 hours, has pain interfered with the following (1-10 greatest interference) ? General activity 2 Relation with others 2 Enjoyment of life 2 What TIME of day is your pain at its worst? anytime       Sleep (in general) stable  Mobility/Function: Assistance device: None How many minutes can you walk? unrestricted Ability to climb steps?  yes Do you drive? yes Disabled (date): N/A Neuro/Psych Sx: (bladder, bowel, weakness, dizziness, depression etc) Anxiety  Prior Studies: See EMR Physicians involved in your care: Any changes since last visit?  Yes -- Dr. Jodi Marble (ENT)   Past Medical History:  Diagnosis Date  . Aortic stenosis    mild echocardiogram 8/09 EF 60%, showed no regional wall motion abnml, mild LVH, mod focal basal septal hypertrophy and mild dyastolic dysfunction. 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 mean gradient across aortic valve was 8 mmHG. also mild L atrial enlargement and normal RV size and function. minimal AS on LHC in 7/10.   . Arthritis    "all over" (07/19/2016)  . BPH (benign prostatic hypertrophy)   . CAD (coronary artery disease)    LCH (7/10) totally occluded proximal RCA with very robuse L to R collaterals, 50% proximal LAD stenosis, EF 65%, medical management.    . Chicken pox   . Chronic lower back pain    s/p surgical fusion  . Diverticulosis   . Esophageal  stricture   . GERD (gastroesophageal reflux disease)   . Heart murmur   . Hepatitis B 1984  . Hiatal hernia   . HLD (hyperlipidemia)   . HTN (hypertension)   . 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 Osteoarthritis   . TIA (transient ischemic attack) 1990s   hx  . Tubular adenoma of colon 2009    Current Outpatient Medications on File Prior to Visit  Medication Sig Dispense Refill  . amLODipine (NORVASC) 10 MG tablet Take 1 tablet (10 mg total) by mouth daily. 90 tablet 3  . aspirin 81 MG EC tablet Take 81 mg by mouth at bedtime.     Marland Kitchen atorvastatin (LIPITOR) 40 MG tablet Take 1 tablet (40 mg total) by mouth daily. 90 tablet 3  . cholecalciferol (VITAMIN D) 1000 UNITS tablet Take 1,000 Units by mouth daily.     . iron polysaccharides (NU-IRON) 150 MG capsule Take 1 capsule (150 mg total) by mouth 2 (two) times daily.    . isosorbide mononitrate (IMDUR) 30 MG 24 hr tablet Take 1 tablet (30 mg total) by mouth at bedtime. 90 tablet 1  . lisinopril (PRINIVIL,ZESTRIL) 10 MG tablet Take 1 tablet (10 mg total) by mouth daily. 90 tablet 3  . metoprolol tartrate (LOPRESSOR) 25 MG tablet Take 0.5 tablets (12.5 mg total) by mouth 2 (two) times daily. 30 tablet 5  . Multiple Vitamins-Minerals (MULTIVITAMIN PO) Take 1 tablet by mouth daily.    . Omega-3 Fatty Acids (FISH OIL PO) Take by  mouth 2 (two) times daily.    Marland Kitchen omeprazole (PRILOSEC) 20 MG capsule TAKE ONE CAPSULE BY MOUTH EVERY DAY 30 capsule 0  . oxyCODONE (OXY IR/ROXICODONE) 5 MG immediate release tablet Take 1 tablet (5 mg total) by mouth every 6 (six) hours as needed for moderate pain. 60 tablet 0  . TURMERIC PO Take by mouth.     No current facility-administered medications on file prior to visit.     Allergies  Allergen Reactions  . Celebrex [Celecoxib] Hives and Itching    Family History  Problem Relation Age of Onset  . Heart disease Father 13       Living  . Coronary artery disease Father        CABG  . Alzheimer's disease Mother 34        Deceased  . Arthritis/Rheumatoid Mother   . Stomach cancer Maternal Uncle   . Brain cancer Maternal Aunt        x2  . Arthritis Brother        DJD  . Obesity Daughter        Had Bypass Sx    Social History   Socioeconomic History  . Marital status: Married    Spouse name: etta  . Number of children: 2  . Years of education: Not on file  . Highest education level: Not on file  Occupational History  . Occupation: retired    Fish farm manager: RETIRED    Comment: disabled due to back problems  Social Needs  . Financial resource strain: Not on file  . Food insecurity:    Worry: Not on file    Inability: Not on file  . Transportation needs:    Medical: Not on file    Non-medical: Not on file  Tobacco Use  . Smoking status: Former Smoker    Packs/day: 1.50    Years: 50.00    Pack years: 75.00    Types: Cigarettes    Last attempt to quit: 06/2015    Years since quitting: 2.3  . Smokeless tobacco: Never Used  . Tobacco comment: smoked less than 1 ppd for 40+ years; Using Vapor Cig  Substance and Sexual Activity  . Alcohol use: No    Alcohol/week: 0.0 standard drinks    Comment: 07/19/2016 "might have a few drinks/year"  . Drug use: No  . Sexual activity: Never  Lifestyle  . Physical activity:    Days per week: Not on file    Minutes per session: Not on file  . Stress: Not on file  Relationships  . Social connections:    Talks on phone: Not on file    Gets together: Not on file    Attends religious service: Not on file    Active member of club or organization: Not on file    Attends meetings of clubs or organizations: Not on file    Relationship status: Not on file  Other Topics Concern  . Not on file  Social History Narrative   Married (3rd), [place]. 2 children from 1st marriage, 4 step children.    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       Cell # [id number]   Review of  Systems - See HPI.  All other ROS are negative.  BP 118/64   Pulse 94   Temp 98.2 F (36.8 C) (Oral)   Resp 16   Ht 5\' 7"  (  1.702 m)   Wt 184 lb (83.5 kg)   BMI 28.82 kg/m   Physical Exam  Constitutional: He appears well-developed and well-nourished.  HENT:  Head: Normocephalic and atraumatic.  Cardiovascular: Normal rate, regular rhythm, normal heart sounds and intact distal pulses.  Pulmonary/Chest: Effort normal and breath sounds normal.  Vitals reviewed.   Recent Results (from the past 2160 hour(s))  POCT rapid strep A     Status: Normal   Collection Time: 07/15/17 10:32 AM  Result Value Ref Range   Rapid Strep A Screen Negative Negative  Culture, Group A Strep     Status: None   Collection Time: 07/15/17 10:32 AM  Result Value Ref Range   MICRO NUMBER: [id number]    SPECIMEN QUALITY: ADEQUATE    SOURCE: NOT GIVEN    STATUS: FINAL    RESULT: No group A Streptococcus isolated     Assessment/Plan: 1. Encounter for chronic pain management 2. Primary osteoarthritis involving multiple joints CSC updated. CS Database reviewed and without red flags. UDS obtained today. Refills sent.  - oxyCODONE (OXY IR/ROXICODONE) 5 MG immediate release tablet; Take 1 tablet (5 mg total) by mouth every 6 (six) hours as needed for moderate pain.  Dispense: 60 tablet; Refill: 0  - Pain Mgmt, Profile 8 w/Conf, U  3. Anxiety Secondary to potential cancer diagnosis. Has upcoming CT scan to further assess. Will start low-dose Klonopin PRN for acute anxiety. Will follow closely.    Leeanne Rio, PA-C

## 2017-09-24 ENCOUNTER — Telehealth: Payer: Self-pay | Admitting: Emergency Medicine

## 2017-09-24 NOTE — Telephone Encounter (Signed)
Noted. Thank you for the update on CT.

## 2017-09-24 NOTE — Telephone Encounter (Signed)
FYI  Copied from [place] [id number]. Topic: Referral - Request >> Sep 16, 2017  1:40 PM Ivar Drape wrote: Reason for CRM:   Patient is still experiencing pain swallowing and some sharp pain occasionally in his left ear.  So he would like the provider to set up a referral to a [facility] ASAP. Patient does not have a preference as to which ENT Specialist he sees. Just make sure he is in [place]. >> Sep 24, 2017  2:18 PM Bea Graff, NT wrote: Pt calling and states that he has a CT scan scheduled for 09/30/17 @ 3pm. He stated that Elyn Aquas wanted to know when this was scheduled.

## 2017-09-25 ENCOUNTER — Other Ambulatory Visit: Payer: Self-pay | Admitting: Gastroenterology

## 2017-09-25 DIAGNOSIS — K219 Gastro-esophageal reflux disease without esophagitis: Secondary | ICD-10-CM

## 2017-09-25 DIAGNOSIS — R1314 Dysphagia, pharyngoesophageal phase: Secondary | ICD-10-CM

## 2017-09-25 DIAGNOSIS — Z86010 Personal history of colonic polyps: Secondary | ICD-10-CM

## 2017-09-26 ENCOUNTER — Ambulatory Visit
Admission: RE | Admit: 2017-09-26 | Discharge: 2017-09-26 | Disposition: A | Discharge status: Home / Self Care | Payer: Medicare Other | Source: Ambulatory Visit | Attending: Otolaryngology | Admitting: Otolaryngology

## 2017-09-26 DIAGNOSIS — C099 Malignant neoplasm of tonsil, unspecified: Secondary | ICD-10-CM

## 2017-09-26 DIAGNOSIS — J351 Hypertrophy of tonsils: Secondary | ICD-10-CM | POA: Diagnosis not present

## 2017-09-26 LAB — PAIN MGMT, PROFILE 8 W/CONF, U
6 Acetylmorphine: NEGATIVE ng/mL (ref <10)
AMPHETAMINES: NEGATIVE ng/mL (ref <500)
Alcohol Metabolites: POSITIVE ng/mL — AB (ref <500)
Benzodiazepines: NEGATIVE ng/mL (ref <100)
Buprenorphine, Urine: NEGATIVE ng/mL (ref <5)
CODEINE: NEGATIVE ng/mL (ref <50)
Cocaine Metabolite: NEGATIVE ng/mL (ref <150)
Creatinine: 74.3 mg/dL
ETHYL GLUCURONIDE (ETG): 9663 ng/mL — AB (ref <500)
ETHYL SULFATE (ETS): 1356 ng/mL — AB (ref <100)
HYDROMORPHONE: NEGATIVE ng/mL (ref <50)
Hydrocodone: NEGATIVE ng/mL (ref <50)
MARIJUANA METABOLITE: NEGATIVE ng/mL (ref <20)
MDMA: NEGATIVE ng/mL (ref <500)
MORPHINE: NEGATIVE ng/mL (ref <50)
Norhydrocodone: NEGATIVE ng/mL (ref <50)
Noroxycodone: 1222 ng/mL — ABNORMAL HIGH (ref <50)
OXIDANT: NEGATIVE ug/mL (ref <200)
OXYCODONE: 1638 ng/mL — AB (ref <50)
OXYCODONE: POSITIVE ng/mL — AB (ref <100)
Opiates: NEGATIVE ng/mL (ref <100)
Oxymorphone: 1124 ng/mL — ABNORMAL HIGH (ref <50)
PH: 6.63 (ref 4.5–9.0)

## 2017-09-26 MED ORDER — IOPAMIDOL (ISOVUE-300) INJECTION 61%
75.0000 mL | Freq: Once | INTRAVENOUS | Status: AC | PRN
  Administered 2017-09-26: 75 mL via INTRAVENOUS

## 2017-09-30 ENCOUNTER — Other Ambulatory Visit: Payer: Medicare Other

## 2017-09-30 DIAGNOSIS — D3709 Neoplasm of uncertain behavior of other specified sites of the oral cavity: Secondary | ICD-10-CM | POA: Diagnosis not present

## 2017-09-30 DIAGNOSIS — Z87891 Personal history of nicotine dependence: Secondary | ICD-10-CM | POA: Diagnosis not present

## 2017-09-30 DIAGNOSIS — Z7289 Other problems related to lifestyle: Secondary | ICD-10-CM | POA: Diagnosis not present

## 2017-10-02 ENCOUNTER — Telehealth (HOSPITAL_COMMUNITY): Payer: Self-pay | Admitting: *Deleted

## 2017-10-02 ENCOUNTER — Other Ambulatory Visit: Payer: Self-pay | Admitting: Physician Assistant

## 2017-10-02 DIAGNOSIS — I714 Abdominal aortic aneurysm, without rupture, unspecified: Secondary | ICD-10-CM

## 2017-10-02 NOTE — Telephone Encounter (Signed)
Received surgical clearance from [facility] for patient to have bronchoscopy.  Dr. Aundra Dubin has cleared patient as long as patient hasn't been having active chest pain.  I spoke with patient directly and no complaints of chest pain.  Clearance faxed today to [phone].

## 2017-10-03 NOTE — H&P (Signed)
Otolaryngology Clinic Note  HPI:    Brandon [name] is a 77 y.o. 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  He is accompanied by his wife.  CT scan done 4 days ago shows thickness and some loss of tissue planes of the left tonsil.  No distinct adenopathy. PMH/Meds/All/SocHx/FamHx/ROS:   Past Medical History      Past Medical History:  Diagnosis Date  . Hypertension       Past Surgical History       Past Surgical History:  Procedure Laterality Date  . BACK SURGERY    . CHOLECYSTECTOMY        No family history of bleeding disorders, wound healing problems or difficulty with anesthesia.   Social History  Social History   Social History  . Marital status: Married    Spouse name: N/A  . Number of children: N/A  . Years of education: N/A      Occupational History  . Not on file.       Social History Main Topics  . Smoking status: Former Research scientist (life sciences)  . Smokeless tobacco: Never Used  . Alcohol use Yes  . Drug use: No  . Sexual activity: Not on file       Other Topics Concern  . Not on file      Social History Narrative  . No narrative on file       Current Outpatient Prescriptions:  .  amLODIPine (NORVASC) 10 MG tablet, Take 10 mg by mouth., Disp: , Rfl:  .  aspirin 81 MG EC tablet *ANTIPLATELET*, Take 81 mg by mouth., Disp: , Rfl:  .  atorvastatin (LIPITOR) 40 MG tablet, Take 40 mg by mouth., Disp: , Rfl:  .  cholecalciferol (VITAMIN D3) 1000 UNIT Tab, Take 1,000 Units by mouth., Disp: , Rfl:  .  iron polysaccharides (NU-IRON) 150 mg iron capsule, Take 150 mg by mouth., Disp: , Rfl:  .  isosorbide mononitrate ER (IMDUR ER) 30 MG 24 hr tablet, Take 30 mg by mouth., Disp: , Rfl:  .  lisinopril (PRINIVIL,ZESTRIL) 10 MG tablet, Take 10 mg by mouth., Disp: , Rfl:  .  metoPROLOL tartrate (LOPRESSOR) 25 MG tablet, Take 12.5 mg by mouth., Disp: , Rfl:  .  multivitamin with minerals tablet, Take by mouth.,  Disp: , Rfl:  .  omega-3 fatty acids-fish oil 300-1,000 mg Cap, Take by mouth., Disp: , Rfl:  .  omeprazole (PRILOSEC) 20 MG capsule, TAKE ONE CAPSULE BY MOUTH EVERY DAY, Disp: , Rfl:  .  oxyCODONE (OXY-IR) 5 mg capsule, Take 1 capsule (5 mg total) by mouth every 6 (six) hours as needed for up to 7 days for Pain., Disp: 30 capsule, Rfl: 0 .  oxyCODONE (ROXICODONE) 5 MG immediate release tablet, Take 5 mg by mouth., Disp: , Rfl:  .  permethrin (ELIMITE) 5 % cream, , Disp: , Rfl:  .  predniSONE (DELTASONE) 10 MG tablet, , Disp: , Rfl: 0 .  triamcinolone (KENALOG) 0.1 % cream, Apply topically., Disp: , Rfl:  .  turmeric, bulk, 100 % Powd, Take by mouth., Disp: , Rfl:   A complete ROS was performed with pertinent positives/negatives noted in the HPI. The remainder of the ROS are negative.    Physical Exam:    There were no vitals taken for this visit. He is anxious but basically healthy.  No neck masses.  I could not see any gross lesion in the oropharynx.  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  Plan: We will put him to sleep in the near future and do a biopsy.  If this is positive, he will need a PET scan.  We are awaiting clearance from his cardiologist, Dr. Aundra Dubin.  I will see him here in the office a couple weeks after the surgery.  We will make arrangements for him to see her radiation oncologist, to the medical oncologist, and the twice monthly tumor board.  I gave him some oxycodone for pain relief.   Lilyan Gilford, MD  [phone]

## 2017-10-04 ENCOUNTER — Telehealth: Payer: Self-pay | Admitting: Emergency Medicine

## 2017-10-04 ENCOUNTER — Encounter (HOSPITAL_BASED_OUTPATIENT_CLINIC_OR_DEPARTMENT_OTHER): Payer: Self-pay

## 2017-10-04 ENCOUNTER — Other Ambulatory Visit: Payer: Self-pay

## 2017-10-04 NOTE — Telephone Encounter (Signed)
Ok. Thank you for making me aware. The biopsy definitely takes precedent. He can reschedule the [place] at his convenience.

## 2017-10-04 NOTE — Telephone Encounter (Signed)
Copied from [place] [phone]. Topic: General - Other >> Oct 04, 2017 10:12 AM Reyne Dumas L wrote: Reason for CRM:   Pt states that he has had to cancel his Ultrasound for 09/04 because his ENT has requested he have a biopsy on the same day. Pt can be reached at [phone]

## 2017-10-04 NOTE — Telephone Encounter (Signed)
FYI

## 2017-10-08 ENCOUNTER — Encounter (HOSPITAL_BASED_OUTPATIENT_CLINIC_OR_DEPARTMENT_OTHER)
Admission: RE | Admit: 2017-10-08 | Discharge: 2017-10-08 | Disposition: A | Discharge status: Home / Self Care | Payer: Medicare Other | Source: Ambulatory Visit | Attending: Otolaryngology | Admitting: Otolaryngology

## 2017-10-08 DIAGNOSIS — C099 Malignant neoplasm of tonsil, unspecified: Secondary | ICD-10-CM | POA: Diagnosis not present

## 2017-10-08 DIAGNOSIS — I10 Essential (primary) hypertension: Secondary | ICD-10-CM | POA: Diagnosis not present

## 2017-10-08 DIAGNOSIS — Z7982 Long term (current) use of aspirin: Secondary | ICD-10-CM | POA: Diagnosis not present

## 2017-10-08 DIAGNOSIS — Z79899 Other long term (current) drug therapy: Secondary | ICD-10-CM | POA: Diagnosis not present

## 2017-10-08 DIAGNOSIS — Z87891 Personal history of nicotine dependence: Secondary | ICD-10-CM | POA: Diagnosis not present

## 2017-10-08 LAB — COMPREHENSIVE METABOLIC PANEL
ALBUMIN: 3.6 g/dL (ref 3.5–5.0)
ALT: 32 U/L (ref 0–44)
ANION GAP: 9 (ref 5–15)
AST: 24 U/L (ref 15–41)
Alkaline Phosphatase: 116 U/L (ref 38–126)
BUN: 8 mg/dL (ref 8–23)
CO2: 25 mmol/L (ref 22–32)
Calcium: 9.1 mg/dL (ref 8.9–10.3)
Chloride: 100 mmol/L (ref 98–111)
Creatinine, Ser: 0.75 mg/dL (ref 0.61–1.24)
GFR calc Af Amer: >60 mL/min (ref >60)
GFR calc non Af Amer: >60 mL/min (ref >60)
GLUCOSE: 167 mg/dL — AB (ref 70–99)
POTASSIUM: 3.9 mmol/L (ref 3.5–5.1)
Sodium: 134 mmol/L — ABNORMAL LOW (ref 135–145)
Total Bilirubin: 1.4 mg/dL — ABNORMAL HIGH (ref 0.3–1.2)
Total Protein: 6.5 g/dL (ref 6.5–8.1)

## 2017-10-09 ENCOUNTER — Encounter (HOSPITAL_BASED_OUTPATIENT_CLINIC_OR_DEPARTMENT_OTHER)
Admission: RE | Disposition: A | Discharge status: Home / Self Care | Payer: Self-pay | Source: Ambulatory Visit | Attending: Otolaryngology

## 2017-10-09 ENCOUNTER — Ambulatory Visit (HOSPITAL_BASED_OUTPATIENT_CLINIC_OR_DEPARTMENT_OTHER)
Admission: RE | Admit: 2017-10-09 | Discharge: 2017-10-09 | Disposition: A | Discharge status: Home / Self Care | Payer: Medicare Other | Source: Ambulatory Visit | Attending: Otolaryngology | Admitting: Otolaryngology

## 2017-10-09 ENCOUNTER — Encounter (HOSPITAL_BASED_OUTPATIENT_CLINIC_OR_DEPARTMENT_OTHER): Payer: Self-pay | Admitting: *Deleted

## 2017-10-09 ENCOUNTER — Ambulatory Visit (HOSPITAL_BASED_OUTPATIENT_CLINIC_OR_DEPARTMENT_OTHER): Payer: Medicare Other | Admitting: Certified Registered"

## 2017-10-09 ENCOUNTER — Other Ambulatory Visit: Payer: Self-pay

## 2017-10-09 ENCOUNTER — Other Ambulatory Visit: Payer: Medicare Other

## 2017-10-09 DIAGNOSIS — I10 Essential (primary) hypertension: Secondary | ICD-10-CM | POA: Insufficient documentation

## 2017-10-09 DIAGNOSIS — Z79899 Other long term (current) drug therapy: Secondary | ICD-10-CM | POA: Diagnosis not present

## 2017-10-09 DIAGNOSIS — Z87891 Personal history of nicotine dependence: Secondary | ICD-10-CM | POA: Insufficient documentation

## 2017-10-09 DIAGNOSIS — C099 Malignant neoplasm of tonsil, unspecified: Secondary | ICD-10-CM | POA: Insufficient documentation

## 2017-10-09 DIAGNOSIS — Z7982 Long term (current) use of aspirin: Secondary | ICD-10-CM | POA: Insufficient documentation

## 2017-10-09 HISTORY — PX: RIGID BRONCHOSCOPY: SHX5069

## 2017-10-09 HISTORY — PX: ESOPHAGOSCOPY: SHX5534

## 2017-10-09 HISTORY — PX: DIRECT LARYNGOSCOPY: SHX5326

## 2017-10-09 SURGERY — LARYNGOSCOPY, DIRECT
Anesthesia: General | Site: Throat | Laterality: Left

## 2017-10-09 MED ORDER — ONDANSETRON HCL 4 MG/2ML IJ SOLN
INTRAMUSCULAR | Status: DC | PRN
  Administered 2017-10-09: 4 mg via INTRAVENOUS

## 2017-10-09 MED ORDER — DEXTROSE-NACL 5-0.45 % IV SOLN: INTRAVENOUS | Status: DC

## 2017-10-09 MED ORDER — LACTATED RINGERS IV SOLN
INTRAVENOUS | Status: DC
  Administered 2017-10-09: 08:00:00 via INTRAVENOUS

## 2017-10-09 MED ORDER — FENTANYL CITRATE (PF) 100 MCG/2ML IJ SOLN
25.0000 ug | INTRAMUSCULAR | Status: DC | PRN
  Administered 2017-10-09 (×3): 50 ug via INTRAVENOUS

## 2017-10-09 MED ORDER — LIDOCAINE HCL (CARDIAC) PF 100 MG/5ML IV SOSY
PREFILLED_SYRINGE | INTRAVENOUS | Status: DC | PRN
  Administered 2017-10-09: 100 mg via INTRAVENOUS

## 2017-10-09 MED ORDER — ONDANSETRON HCL 4 MG/2ML IJ SOLN: 4.0000 mg | INTRAMUSCULAR | Status: DC | PRN

## 2017-10-09 MED ORDER — HYDROMORPHONE HCL 2 MG PO TABS: 2.0000 mg | ORAL_TABLET | Freq: Four times a day (QID) | ORAL | Status: DC | PRN

## 2017-10-09 MED ORDER — ONDANSETRON HCL 4 MG PO TABS: 4.0000 mg | ORAL_TABLET | ORAL | Status: DC | PRN

## 2017-10-09 MED ORDER — OXYMETAZOLINE HCL 0.05 % NA SOLN
NASAL | Status: AC
  Filled 2017-10-09: qty 15

## 2017-10-09 MED ORDER — MEPERIDINE HCL 25 MG/ML IJ SOLN: 6.2500 mg | INTRAMUSCULAR | Status: DC | PRN

## 2017-10-09 MED ORDER — FENTANYL CITRATE (PF) 100 MCG/2ML IJ SOLN
INTRAMUSCULAR | Status: AC
  Filled 2017-10-09: qty 2

## 2017-10-09 MED ORDER — DEXAMETHASONE SODIUM PHOSPHATE 4 MG/ML IJ SOLN
INTRAMUSCULAR | Status: DC | PRN
  Administered 2017-10-09: 10 mg via INTRAVENOUS

## 2017-10-09 MED ORDER — PROPOFOL 10 MG/ML IV BOLUS
INTRAVENOUS | Status: DC | PRN
  Administered 2017-10-09 (×2): 50 mg via INTRAVENOUS

## 2017-10-09 MED ORDER — OXYCODONE HCL 5 MG/5ML PO SOLN
5.0000 mg | Freq: Once | ORAL | Status: AC | PRN
  Administered 2017-10-09: 5 mg via ORAL

## 2017-10-09 MED ORDER — DEXAMETHASONE SODIUM PHOSPHATE 10 MG/ML IJ SOLN: 10.0000 mg | Freq: Once | INTRAMUSCULAR | Status: DC

## 2017-10-09 MED ORDER — TRIAMCINOLONE ACETONIDE 40 MG/ML IJ SUSP
INTRAMUSCULAR | Status: AC
  Filled 2017-10-09: qty 5

## 2017-10-09 MED ORDER — OXYCODONE HCL 5 MG PO TABS: 5.0000 mg | ORAL_TABLET | Freq: Once | ORAL | Status: AC | PRN

## 2017-10-09 MED ORDER — SODIUM CHLORIDE 0.9 % IV SOLN
INTRAVENOUS | Status: DC | PRN
  Administered 2017-10-09 (×3): 40 ug via INTRAVENOUS

## 2017-10-09 MED ORDER — ROCURONIUM BROMIDE 100 MG/10ML IV SOLN
INTRAVENOUS | Status: DC | PRN
  Administered 2017-10-09: 30 mg via INTRAVENOUS

## 2017-10-09 MED ORDER — SCOPOLAMINE 1 MG/3DAYS TD PT72: 1.0000 | MEDICATED_PATCH | Freq: Once | TRANSDERMAL | Status: DC | PRN

## 2017-10-09 MED ORDER — PHENYLEPHRINE HCL 10 MG/ML IJ SOLN
INTRAMUSCULAR | Status: DC | PRN
  Administered 2017-10-09: 50 ug via INTRAVENOUS

## 2017-10-09 MED ORDER — HYDROMORPHONE HCL 2 MG PO TABS: 2.0000 mg | ORAL_TABLET | ORAL | 0 refills | Status: AC | PRN

## 2017-10-09 MED ORDER — MIDAZOLAM HCL 2 MG/2ML IJ SOLN: 1.0000 mg | INTRAMUSCULAR | Status: DC | PRN

## 2017-10-09 MED ORDER — OXYCODONE HCL 5 MG/5ML PO SOLN
ORAL | Status: AC
  Filled 2017-10-09: qty 5

## 2017-10-09 MED ORDER — IBUPROFEN 100 MG/5ML PO SUSP: 400.0000 mg | Freq: Four times a day (QID) | ORAL | Status: DC | PRN

## 2017-10-09 MED ORDER — SUGAMMADEX SODIUM 200 MG/2ML IV SOLN
INTRAVENOUS | Status: DC | PRN
  Administered 2017-10-09: 200 mg via INTRAVENOUS

## 2017-10-09 MED ORDER — FENTANYL CITRATE (PF) 100 MCG/2ML IJ SOLN
50.0000 ug | INTRAMUSCULAR | Status: AC | PRN
  Administered 2017-10-09: 25 ug via INTRAVENOUS
  Administered 2017-10-09: 50 ug via INTRAVENOUS
  Administered 2017-10-09: 25 ug via INTRAVENOUS

## 2017-10-09 MED ORDER — EPINEPHRINE 30 MG/30ML IJ SOLN
INTRAMUSCULAR | Status: AC
  Filled 2017-10-09: qty 1

## 2017-10-09 SURGICAL SUPPLY — 39 items
ADAPTER TUBE FLEX ULTRASET (MISCELLANEOUS) ×2 IMPLANT
CANISTER SUCT 1200ML W/VALVE (MISCELLANEOUS) ×2 IMPLANT
CONT SPEC 4OZ CLIKSEAL STRL BL (MISCELLANEOUS) ×1 IMPLANT
DEVICE INFLATION 20/61 (MISCELLANEOUS) IMPLANT
FILTER 7/8 IN (FILTER) IMPLANT
GAUZE SPONGE 4X4 12PLY STRL LF (GAUZE/BANDAGES/DRESSINGS) ×4 IMPLANT
GLOVE BIOGEL PI IND STRL 7.0 (GLOVE) IMPLANT
GLOVE BIOGEL PI INDICATOR 7.0 (GLOVE) ×1
GLOVE ECLIPSE 6.5 STRL STRAW (GLOVE) ×1 IMPLANT
GLOVE ECLIPSE 8.0 STRL XLNG CF (GLOVE) ×2 IMPLANT
GOWN STRL REUS W/ TWL LRG LVL3 (GOWN DISPOSABLE) IMPLANT
GOWN STRL REUS W/ TWL XL LVL3 (GOWN DISPOSABLE) ×1 IMPLANT
GOWN STRL REUS W/TWL LRG LVL3 (GOWN DISPOSABLE) ×2
GOWN STRL REUS W/TWL XL LVL3 (GOWN DISPOSABLE)
GUARD TEETH (MISCELLANEOUS) IMPLANT
IV NS 500ML (IV SOLUTION)
IV NS 500ML BAXH (IV SOLUTION) IMPLANT
MARKER SKIN DUAL TIP RULER LAB (MISCELLANEOUS) IMPLANT
NDL HYPO 18GX1.5 BLUNT FILL (NEEDLE) IMPLANT
NDL SPNL 22GX7 QUINCKE BK (NEEDLE) IMPLANT
NEEDLE HYPO 18GX1.5 BLUNT FILL (NEEDLE) IMPLANT
NEEDLE SPNL 22GX7 QUINCKE BK (NEEDLE) IMPLANT
NS IRRIG 1000ML POUR BTL (IV SOLUTION) ×2 IMPLANT
PACK BASIN DAY SURGERY FS (CUSTOM PROCEDURE TRAY) ×1 IMPLANT
PATTIES SURGICAL .5 X3 (DISPOSABLE) ×2 IMPLANT
REDUCTION FITTING 1/4 IN (FILTER) IMPLANT
SHEET MEDIUM DRAPE 40X70 STRL (DRAPES) ×2 IMPLANT
SLEEVE SCD COMPRESS KNEE MED (MISCELLANEOUS) ×1 IMPLANT
SOLUTION BUTLER CLEAR DIP (MISCELLANEOUS) ×2 IMPLANT
SURGILUBE 2OZ TUBE FLIPTOP (MISCELLANEOUS) ×3 IMPLANT
SYR 20CC LL (SYRINGE) IMPLANT
SYR 5ML LL (SYRINGE) ×1 IMPLANT
SYR CONTROL 10ML LL (SYRINGE) IMPLANT
SYSTEM BALLN DILATION 14X40 (BALLOONS) IMPLANT
SYSTEM BALLN DILATION 16X40 (BALLOONS) IMPLANT
TOWEL GREEN STERILE FF (TOWEL DISPOSABLE) ×2 IMPLANT
TRAP SPECIMEN MUCOUS 40CC (MISCELLANEOUS) IMPLANT
TUBE CONNECTING 20X1/4 (TUBING) ×2 IMPLANT
YANKAUER SUCT BULB TIP NO VENT (SUCTIONS) IMPLANT

## 2017-10-09 NOTE — Interval H&P Note (Signed)
History and Physical Interval Note:  10/09/2017 8:42 AM  Brandon [name]  has presented today for surgery, with the diagnosis of LEFT TONSIL CANCER  The various methods of treatment have been discussed with the patient and family. After consideration of risks, benefits and other options for treatment, the patient has consented to  Procedure(s): DIRECT LARYNGOSCOPY WITH BOPSY (Left) ESOPHAGOSCOPY (Left) RIGID BRONCHOSCOPY (Left) as a surgical intervention .  The patient's history has been re-reviewed, patient re-examined, no change in status, stable for surgery.  I have re-reviewed the patient's chart and labs.  Questions were answered to the patient's satisfaction.     Jodi Marble

## 2017-10-09 NOTE — Anesthesia Procedure Notes (Signed)
Date/Time: 10/09/2017 8:45 AM Performed by: Signe Colt, CRNA Pre-anesthesia Checklist: Patient identified, Emergency Drugs available, Suction available, Patient being monitored and Timeout performed Patient Re-evaluated:Patient Re-evaluated prior to induction Oxygen Delivery Method: Circle system utilized Induction Type: IV induction Ventilation: Mask ventilation without difficulty

## 2017-10-09 NOTE — Anesthesia Preprocedure Evaluation (Addendum)
Anesthesia Evaluation  Patient identified by MRN, date of birth, ID band Patient awake    Reviewed: Allergy & Precautions, NPO status , Patient's Chart, lab work & pertinent test results  Airway Mallampati: III  TM Distance: >3 FB Neck ROM: Full    Dental  (+) Edentulous Upper, Edentulous Lower, Dental Advisory Given   Pulmonary neg pulmonary ROS, former smoker,    Pulmonary exam normal breath sounds clear to auscultation       Cardiovascular hypertension, Pt. on medications and Pt. on home beta blockers + CAD and + Peripheral Vascular Disease  Normal cardiovascular exam+ Valvular Problems/Murmurs AI and AS  Rhythm:Regular Rate:Normal  ECG: rate 73. Sinus rhythm with Premature supraventricular complexes  ECHO:  Left ventricle: The cavity size was normal. Wall thickness was increased in a pattern of mild LVH. Systolic function was vigorous. The estimated ejection fraction was in the range of 65% to 70%. Features are consistent with a pseudonormal left ventricular filling pattern, with concomitant abnormal relaxation and increased filling pressure (grade 2 diastolic dysfunction). Aortic valve: Mildly calcified annulus. Moderately thickened, moderately calcified leaflets. There was mild to moderate regurgitation. Valve area (VTI): 1.26 cm^2. Valve area (Vmax): 1.03 cm^2. Left atrium: The atrium was moderately to severely dilated.  Dr. Aundra Dubin   Neuro/Psych Anxiety TIACVA    GI/Hepatic hiatal hernia, GERD  Medicated,(+) Hepatitis -  Endo/Other  negative endocrine ROS  Renal/GU negative Renal ROS     Musculoskeletal negative musculoskeletal ROS (+)   Abdominal   Peds  Hematology HLD   Anesthesia Other Findings LEFT TONSIL CANCER  Reproductive/Obstetrics                          Anesthesia Physical Anesthesia Plan  ASA: IV  Anesthesia Plan: General   Post-op Pain Management:    Induction:  Intravenous  PONV Risk Score and Plan: 2 and Ondansetron, Dexamethasone, Treatment may vary due to age or medical condition, Propofol infusion and Midazolam  Airway Management Planned: Oral ETT  Additional Equipment:   Intra-op Plan:   Post-operative Plan: Extubation in OR  Informed Consent: I have reviewed the patients History and Physical, chart, labs and discussed the procedure including the risks, benefits and alternatives for the proposed anesthesia with the patient or authorized representative who has indicated his/her understanding and acceptance.   Dental advisory given  Plan Discussed with: CRNA  Anesthesia Plan Comments:       Anesthesia Quick Evaluation

## 2017-10-09 NOTE — Anesthesia Postprocedure Evaluation (Signed)
Anesthesia Post Note  Patient: Brandon [name]  Procedure(s) Performed: DIRECT LARYNGOSCOPY WITH BOPSY (Left Throat) ESOPHAGOSCOPY (Left Throat) RIGID BRONCHOSCOPY (Left Throat)     Patient location during evaluation: PACU Anesthesia Type: General Level of consciousness: sedated and patient cooperative Pain management: pain level controlled Vital Signs Assessment: post-procedure vital signs reviewed and stable Respiratory status: spontaneous breathing Cardiovascular status: stable Anesthetic complications: no    Last Vitals:  Vitals:   10/09/17 1015 10/09/17 1050  BP: (!) 122/55 (!) 154/79  Pulse: 75 82  Resp: 15 20  Temp:  37.1 C  SpO2: 93% 94%    Last Pain:  Vitals:   10/09/17 1050  TempSrc:   PainSc: [place]

## 2017-10-09 NOTE — Discharge Instructions (Signed)
°  Post Anesthesia Home Care Instructions  Activity: Get plenty of rest for the remainder of the day. A responsible individual must stay with you for 24 hours following the procedure.  For the next 24 hours, DO NOT: -Drive a car -Paediatric nurse -Drink alcoholic beverages -Take any medication unless instructed by your physician -Make any legal decisions or sign important papers.  Meals: Start with liquid foods such as gelatin or soup. Progress to regular foods as tolerated. Avoid greasy, spicy, heavy foods. If nausea and/or vomiting occur, drink only clear liquids until the nausea and/or vomiting subsides. Call your physician if vomiting continues.  Special Instructions/Symptoms: Your throat may feel dry or sore from the anesthesia or the breathing tube placed in your throat during surgery. If this causes discomfort, gargle with warm salt water. The discomfort should disappear within 24 hours.  If you had a scopolamine patch placed behind your ear for the management of post- operative nausea and/or vomiting:  1. The medication in the patch is effective for 72 hours, after which it should be removed.  Wrap patch in a tissue and discard in the trash. Wash hands thoroughly with soap and water. 2. You may remove the patch earlier than 72 hours if you experience unpleasant side effects which may include dry mouth, dizziness or visual disturbances. 3. Avoid touching the patch. Wash your hands with soap and water after contact with the patch.   Try to include some nourishing milkshakes, etc. To boost your nutrition. Recheck my office 2 weeks. [phone] We will call you with the pathology reports We will help you make appointments to see the Medical Oncologists, the Radiation Oncologists, and will order a PET scan.   We may consider a referral to [facility]. Call for excessive pain, inability to swallow

## 2017-10-09 NOTE — Op Note (Signed)
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  LEFT level II 3 cm node  Proc:  Direct Laryngoscopy with Biopsy,  Esophagoscopy,  Bronchoscopy  Procedure: With the patient in a comfortable supine position, GOT anesthesia was induced without difficulty.  At an appropriate level, the table was turned 90 degrees away from Anesthesia.  A clean preparation and draping was performed in the standard fashion.  A surgical time out was obtained in the standard fashion.  A moist 4x4 was used to protect the upper gum.     Using the [facility] laryngoscope, the larynx was visualized.  The rod bronchoscope was inserted and inspection down into the mainstem bronchus on each side was performed with the findings as described above.  The bronchoscope and laryngoscope were removed.  The cervical esophagoscope was lubricated and inserted into the hypopharynx.  With gentle pressure, it was passed through the cricopharyngeus and advanced to its full length without difficulty with the findings as described above.  It was removed.   The anterior commissure laryngoscope was introduced taking care to protect lips, teeth, and endotracheal tube.  Complete laryngoscopy was performed in the standard fashion.  The findings were as described above.  Biopsies under direct vision, and additional biopsies finger guided were performed.  The laryngoscope was removed.   The oropharynx, oral cavity, nasopharynx and hypopharynx were palpated with findings as described above.  The neck was palpated on both sides with the findings as described above.  At this point the procedure was completed. The tooth guard was removed.   Dental status was intact.  The patient  was returned to Anesthesia, awakened, extubated, and transferred to PACU in satisfactory condition.   Dispo:   PACU to home  Plan:   Await Pathology.  Med Onc, Rad Onc, PET scan.    Tyson Alias MD

## 2017-10-09 NOTE — Transfer of Care (Signed)
Immediate Anesthesia Transfer of Care Note  Patient: Brandon [name]  Procedure(s) Performed: DIRECT LARYNGOSCOPY WITH BOPSY (Left Throat) ESOPHAGOSCOPY (Left Throat) RIGID BRONCHOSCOPY (Left Throat)  Patient Location: PACU  Anesthesia Type:General  Level of Consciousness: awake, alert , oriented and patient cooperative  Airway & Oxygen Therapy: Patient Spontanous Breathing and Patient connected to face mask oxygen  Post-op Assessment: Report given to RN and Post -op Vital signs reviewed and stable  Post vital signs: Reviewed and stable  Last Vitals:  Vitals Value Taken Time  BP    Temp    Pulse 82 10/09/2017  9:28 AM  Resp 25 10/09/2017  9:28 AM  SpO2 97 % 10/09/2017  9:28 AM  Vitals shown include unvalidated device data.  Last Pain:  Vitals:   10/09/17 0739  TempSrc: Oral  PainSc: 4       Patients Stated Pain Goal: 2 (08/81/10 3159)  Complications: No apparent anesthesia complications

## 2017-10-09 NOTE — Anesthesia Procedure Notes (Signed)
Procedure Name: Intubation Date/Time: 10/09/2017 9:00 AM Performed by: Signe Colt, CRNA Pre-anesthesia Checklist: Patient identified, Emergency Drugs available, Suction available and Patient being monitored Patient Re-evaluated:Patient Re-evaluated prior to induction Oxygen Delivery Method: Circle system utilized Preoxygenation: Pre-oxygenation with 100% oxygen Induction Type: IV induction Ventilation: Mask ventilation without difficulty Tube type: Oral Tube size: 6.5 mm Number of attempts: 1 Airway Equipment and Method: Stylet and Oral airway Placement Confirmation: ETT inserted through vocal cords under direct vision,  positive ETCO2 and breath sounds checked- equal and bilateral Secured at: 21 cm Tube secured with: Tape Dental Injury: Teeth and Oropharynx as per pre-operative assessment  Comments: Patient intubated per Dr Warren Danes after rigid bronch

## 2017-10-10 ENCOUNTER — Encounter (HOSPITAL_BASED_OUTPATIENT_CLINIC_OR_DEPARTMENT_OTHER): Payer: Self-pay | Admitting: Otolaryngology

## 2017-10-14 ENCOUNTER — Other Ambulatory Visit: Payer: Self-pay | Admitting: Physician Assistant

## 2017-10-14 ENCOUNTER — Telehealth: Payer: Self-pay | Admitting: *Deleted

## 2017-10-14 MED ORDER — CLONAZEPAM 0.5 MG PO TABS: ORAL_TABLET | ORAL | 0 refills | Status: DC

## 2017-10-14 NOTE — Telephone Encounter (Signed)
Last OV 09/23/17, No future OV  Last filled 09/23/17, # 5 with 0 refills

## 2017-10-14 NOTE — Telephone Encounter (Signed)
Refill of klonopin  LOV 09/23/17 C. [employer]; states prior authorization needed.    [facility] on [address] and [address]    Pt also states has not received results from Dr Erik Obey, still having a lot of anxiety.

## 2017-10-14 NOTE — Telephone Encounter (Signed)
Please advise of medication refill

## 2017-10-14 NOTE — Telephone Encounter (Addendum)
Oncology Nurse Navigator Documentation  Placed introductory call to new referral patient Brandon [name].  Introduced myself as the H&N oncology nurse navigator that works with Dr. Isidore Moos to whom he has been referred by Dr. Erik Obey.  Confirmed understanding of referral, explained appt will be scheduled with Dr. Isidore Moos as well as Dr. Maylon Peppers, Medical Oncology, as soon as PET scan schedule.     I noted in [place] appears referral has been made to Dr. Nicolette Bang, [facility], PET scan may be conducted there.  He commented understanding from Dr. Erik Obey laser surgery may be viable option. I encouraged him to call Dr. Noreene Filbert office tomorrow for clarification.  Briefly explained my role as his navigator, indicated I would be joining him during upcoming consultations if/when scheduled.  Confirmed understanding of [place] location.  Answered his questions re tmt options, including chemoRT, RT alone, laser surgery.  Provided my contact information, encouraged him to call with questions/concerns before next week. He verbalized understanding of information provided, expressed appreciation for my call.  Navigator Needs Assessment . Employment status - Retired . Insured including Medicare supplement . Prefers late morning or afternoon appts. . Support system - married, dtr lives in [place] . Transportation - has own car . PCP  o Yes o Name - As noted in [place] o Frequency seen - PRN o Last appt, purpose - 1 1/2 weeks ago, throat soreness o Aware of cancer dx?      Yes . PCD  o No - edentulous, has had dentures for about 30 yrs   Gayleen Orem, Therapist, sports, BSN Head & Neck Oncology Nurse [place] at [place] [phone]

## 2017-10-14 NOTE — Telephone Encounter (Signed)
Pt also states he has an Appt with Dr. Erik Obey 4/60/02 and would like "More than the usual refill of 5 pills to get me through that time."

## 2017-10-14 NOTE — Telephone Encounter (Signed)
Copied from [place] [id number]. Topic: Quick Communication - Rx Refill/Question >> Oct 14, 2017  9:42 AM Synthia Innocent wrote: Medication: clonazePAM (KLONOPIN) 0.5 MG tablet   Has the patient contacted their pharmacy? Yes.   (Agent: If no, request that the patient contact the pharmacy for the refill.) (Agent: If yes, when and what did the pharmacy advise?)  Preferred Pharmacy (with phone number or street name): [facility] on [address]: Please be advised that RX refills may take up to 3 business days. We ask that you follow-up with your pharmacy.   Has not received results from Dr Erik Obey, still having a lot of anxiety.

## 2017-10-17 ENCOUNTER — Encounter: Payer: Self-pay | Admitting: Physician Assistant

## 2017-10-17 ENCOUNTER — Telehealth: Payer: Self-pay | Admitting: *Deleted

## 2017-10-17 NOTE — Telephone Encounter (Signed)
Oncology Nurse Navigator Documentation  Received call from Mr. Frenette informing of 9/18 0730 PET at [facility], 9/24 1030 consult with Dr. Nicolette Bang.  Gayleen Orem, RN, BSN Head & Neck Oncology Nurse [place] at [place] [phone]

## 2017-10-18 ENCOUNTER — Ambulatory Visit: Payer: Medicare Other | Admitting: Physician Assistant

## 2017-10-18 ENCOUNTER — Other Ambulatory Visit: Payer: Self-pay

## 2017-10-18 ENCOUNTER — Encounter: Payer: Self-pay | Admitting: Physician Assistant

## 2017-10-18 VITALS — BP 126/68 | HR 82 | Temp 97.8°F | Resp 16 | Ht 67.0 in | Wt 173.4 lb

## 2017-10-18 DIAGNOSIS — F411 Generalized anxiety disorder: Secondary | ICD-10-CM | POA: Diagnosis not present

## 2017-10-18 DIAGNOSIS — M159 Polyosteoarthritis, unspecified: Secondary | ICD-10-CM

## 2017-10-18 DIAGNOSIS — C099 Malignant neoplasm of tonsil, unspecified: Secondary | ICD-10-CM

## 2017-10-18 DIAGNOSIS — M150 Primary generalized (osteo)arthritis: Secondary | ICD-10-CM

## 2017-10-18 DIAGNOSIS — M8949 Other hypertrophic osteoarthropathy, multiple sites: Secondary | ICD-10-CM

## 2017-10-18 MED ORDER — OXYCODONE HCL 5 MG PO TABS: 5.0000 mg | ORAL_TABLET | Freq: Four times a day (QID) | ORAL | 0 refills | Status: DC | PRN

## 2017-10-18 MED ORDER — CITALOPRAM HYDROBROMIDE 20 MG PO TABS: ORAL_TABLET | ORAL | 3 refills | Status: DC

## 2017-10-18 MED ORDER — PANTOPRAZOLE SODIUM 40 MG PO TBEC: 40.0000 mg | DELAYED_RELEASE_TABLET | Freq: Every day | ORAL | 3 refills | Status: DC

## 2017-10-18 NOTE — Patient Instructions (Addendum)
Please keep well-hydrated and get plenty of rest.  Continue pain medications as directed. You can alternate this with 500 mg tylenol to keep pain under better control. Start the Citalopram as directed. I have switched your Omeprazole to Pantoprazole for your reflux as is safer with these anxiety medications.  Use the Klonopin as directed when needed for severe anxiety only.  Follow-up with me in 4-6 weeks. Follow-up with all of your specialists as scheduled.  I am keeping you in my thoughts and prayers. We will support you in any way we can.

## 2017-10-18 NOTE — Assessment & Plan Note (Signed)
HPV +. Discussed continuation of roxicodone, using tylenol 250-500 mg in-between as needed. Follow-up with specialists as scheduled.

## 2017-10-18 NOTE — Progress Notes (Signed)
Patient presents to clinic today to discuss anxiety 2/2 recent cancer diagnosis. 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 Is scheduled for PET scan on 10/23/17 and consultation with surgery/radiation oncology on 10/29/17 at [facility]. Feels that he is ok with the upcoming treatment but is anxious about diagnosis and the prognosis. Is noting panic attack for which he has been taking Klonopin at a low dose. Notes this controls anxiety for the moment but is experiencing a more generalized, all-day-long anxiety. Is affecting appetite and sleep. Would like to discuss options for this. Patient also noting more significant odynophagia since his biopsy. Is able to eat. Roxicodone 5 mg for chronic back pain is helping but had to take more than as directed for a couple of days 2/p biopsy. States he took according to ENT directions. Has started some tylenol today and notes this has helped significantly.    Past Medical History:  Diagnosis Date  . Aortic stenosis    mild echocardiogram 8/09 EF 60%, showed no regional wall motion abnml, mild LVH, mod focal basal septal hypertrophy and mild dyastolic dysfunction. 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 mean gradient across aortic valve was 8 mmHG. also mild L atrial enlargement and normal RV size and function. minimal AS on LHC in 7/10.   . Arthritis    "all over" (07/19/2016)  . BPH (benign prostatic hypertrophy)   . CAD (coronary artery disease)    L[facility] (7/10) totally occluded proximal RCA with very robuse L to R collaterals, 50% proximal LAD stenosis, EF 65%, medical management.    . Chicken pox   . Chronic lower back pain    s/p surgical fusion  . Diverticulosis   . Esophageal stricture   . GERD (gastroesophageal reflux disease)   . Heart murmur   . Hepatitis B 1984  . Hiatal hernia   . HLD (hyperlipidemia)   . HTN (hypertension)   . 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 Osteoarthritis   . TIA (transient  ischemic attack) 1990s   hx  . Tubular adenoma of colon 2009    Current Outpatient Medications on File Prior to Visit  Medication Sig Dispense Refill  . amLODipine (NORVASC) 10 MG tablet Take 1 tablet (10 mg total) by mouth daily. 90 tablet 3  . aspirin 81 MG EC tablet Take 81 mg by mouth at bedtime.     . cholecalciferol (VITAMIN D) 1000 UNITS tablet Take 1,000 Units by mouth daily.     . clonazePAM (KLONOPIN) 0.5 MG tablet TAKE 1 TABLET(0.5 MG) BY MOUTH TWICE DAILY AS NEEDED FOR ANXIETY 20 tablet 0  . iron polysaccharides (NU-IRON) 150 MG capsule Take 1 capsule (150 mg total) by mouth 2 (two) times daily.    . isosorbide mononitrate (IMDUR) 30 MG 24 hr tablet Take 1 tablet (30 mg total) by mouth at bedtime. 90 tablet 1  . metoprolol tartrate (LOPRESSOR) 25 MG tablet Take 0.5 tablets (12.5 mg total) by mouth 2 (two) times daily. 30 tablet 5  . Multiple Vitamins-Minerals (MULTIVITAMIN PO) Take 1 tablet by mouth daily.    . Omega-3 Fatty Acids (FISH OIL PO) Take by mouth 2 (two) times daily.    . TURMERIC PO Take by mouth.    Marland Kitchen atorvastatin (LIPITOR) 40 MG tablet Take 1 tablet (40 mg total) by mouth daily. 90 tablet 3  . lisinopril (PRINIVIL,ZESTRIL) 10 MG tablet Take 1 tablet (10 mg total) by mouth daily. 90 tablet 3  No current facility-administered medications on file prior to visit.     Allergies  Allergen Reactions  . Celebrex [Celecoxib] Hives and Itching    Family History  Problem Relation Age of Onset  . Heart disease Father 46       Living  . Coronary artery disease Father        CABG  . Alzheimer's disease Mother 49       Deceased  . Arthritis/Rheumatoid Mother   . Stomach cancer Maternal Uncle   . Brain cancer Maternal Aunt        x2  . Arthritis Brother        DJD  . Obesity Daughter        Had Bypass Sx    Social History   Socioeconomic History  . Marital status: Married    Spouse name: etta  . Number of children: 2  . Years of education: Not on file    . Highest education level: Not on file  Occupational History  . Occupation: retired    Fish farm manager: RETIRED    Comment: disabled due to back problems  Social Needs  . Financial resource strain: Not on file  . Food insecurity:    Worry: Not on file    Inability: Not on file  . Transportation needs:    Medical: Not on file    Non-medical: Not on file  Tobacco Use  . Smoking status: Former Smoker    Packs/day: 1.50    Years: 50.00    Pack years: 75.00    Types: Cigarettes    Last attempt to quit: 06/2015    Years since quitting: 2.3  . Smokeless tobacco: Never Used  . Tobacco comment: smoked less than 1 ppd for 40+ years; Using Vapor Cig  Substance and Sexual Activity  . Alcohol use: No    Alcohol/week: 0.0 standard drinks    Comment: 07/19/2016 "might have a few drinks/year"  . Drug use: No  . Sexual activity: Never  Lifestyle  . Physical activity:    Days per week: Not on file    Minutes per session: Not on file  . Stress: Not on file  Relationships  . Social connections:    Talks on phone: Not on file    Gets together: Not on file    Attends religious service: Not on file    Active member of club or organization: Not on file    Attends meetings of clubs or organizations: Not on file    Relationship status: Not on file  Other Topics Concern  . Not on file  Social History Narrative   Married (3rd), [place]. 2 children from 1st marriage, 4 step children.    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       Cell # [id number]   Review of Systems - See HPI.  All other ROS are negative.  BP 126/68   Pulse 82   Temp 97.8 F (36.6 C) (Oral)   Resp 16   Ht '5\' 7"'  (1.702 m)   Wt 173 lb 6.4 oz (78.7 kg)   SpO2 99%   BMI 27.16 kg/m   Physical Exam  Constitutional: He is oriented to person, place, and time. He appears well-developed and well-nourished.  HENT:  Head: Normocephalic and  atraumatic.  Eyes: Conjunctivae are normal.  Neck: Neck supple.  Cardiovascular: Normal rate, regular rhythm, normal heart sounds  and intact distal pulses.  Pulmonary/Chest: Effort normal.  Lymphadenopathy:    He has no cervical adenopathy.  Neurological: He is alert and oriented to person, place, and time.  Psychiatric: He has a normal mood and affect.  Vitals reviewed.  Recent Results (from the past 2160 hour(s))  Pain Mgmt, Profile 8 w/Conf, U     Status: Abnormal   Collection Time: 09/23/17 11:11 AM  Result Value Ref Range   Creatinine 74.3 > or = 20. mg/dL   pH 6.63 4.5 - 9.0   Oxidant NEGATIVE <200 mcg/mL   Amphetamines NEGATIVE <500 ng/mL   medMATCH Amphetamines CONSISTENT    Benzodiazepines NEGATIVE <100 ng/mL   medMATCH Benzodiazepines CONSISTENT    Marijuana Metabolite NEGATIVE <20 ng/mL   medMATCH Marijuana Metab CONSISTENT    Cocaine Metabolite NEGATIVE <150 ng/mL   medMATCH Cocaine Metab CONSISTENT    Opiates NEGATIVE CONFIRMED <100 ng/mL   Codeine NEGATIVE <50 ng/mL    Comment: See Note 1   medMATCH Codeine CONSISTENT    Hydrocodone NEGATIVE <50 ng/mL    Comment: See Note 1   medMATCH Hydrocodone CONSISTENT    Hydromorphone NEGATIVE <50 ng/mL    Comment: See Note 1   medMATCH Hydromorphone CONSISTENT    Morphine NEGATIVE <50 ng/mL    Comment: See Note 1   medMATCH Morphine CONSISTENT    Norhydrocodone NEGATIVE <50 ng/mL    Comment: See Note 1   medMATCH Norhydrocodone CONSISTENT    Oxycodone POSITIVE (A) <100 ng/mL   Noroxycodone 1,222 (H) <50 ng/mL    Comment: See Note 1   medMATCH Noroxycodone INCONSISTENT     Comment: See Note 2   Oxycodone 1,638 (H) <50 ng/mL    Comment: See Note 1   medMATCH Oxycodone INCONSISTENT    Oxymorphone 1,124 (H) <50 ng/mL    Comment: See Note 1   medMATCH Oxymorphone INCONSISTENT     Comment: See Note 3   Buprenorphine, Urine NEGATIVE <5 ng/mL   medMATCH Buprenorphine CONSISTENT    MDMA NEGATIVE <500 ng/mL    [facility] MDMA CONSISTENT    Alcohol Metabolites POSITIVE (A) <500 ng/mL   Ethyl Glucuronide (ETG) 9,663 (H) <500 ng/mL    Comment: See Note 1   medMATCH ETG INCONSISTENT    Ethyl Sulfate (ETS) 1,356 (H) <100 ng/mL    Comment: See Note 1   medMATCH ETS INCONSISTENT     Comment: Note 1 . This test was developed and its analytical performance  characteristics have been determined by [employer]. It has not been cleared or approved by the FDA. This assay has been validated pursuant to the CLIA  regulations and is used for clinical purposes. . Note 2 Noroxycodone is a metabolite of Oxycodone. . Note 3 Oxymorphone is a metabolite of oxycodone as well as  a prescribed drug.    6 Acetylmorphine NEGATIVE <10 ng/mL   medMATCH 6 Acetylmorphine CONSISTENT     Comment: This drug testing is for medical treatment only.   Analysis was performed as non-forensic testing and  these results should be used only by healthcare  providers to render diagnosis or treatment, or to  monitor progress of medical conditions. Hazel Sams comments are:  - present when drug test results may be the result of     metabolism of one or more drugs or when results are     inconsistent with prescribed medication(s) listed.  - may be blank when drug results are consistent with  prescribed medication(s) listed. . For assistance with interpreting these drug results,  please contact a [employer] Toxicology  Specialist: [phone] [place] [phone]), M-F,  8am-6pm EST. This drug testing is for medical treatment only.   Analysis was performed as non-forensic testing and  these results should be used only by healthcare  providers to render diagnosis or treatment, or to  monitor progress of medical conditions. Hazel Sams comments are:  - present when  drug test results may be the result of     metabolism of one or more drugs or when results are     inconsistent with prescribed medication(s)  listed.  - may be blank when drug results are consistent with     prescribed medication(s) listed. . For assistance with interpreting these drug results,  please contact a [employer] Toxicology  Specialist: [phone] [place] [phone]), M-F,  8am-6pm EST. This drug testing is for medical treatment only.   Analysis was performed as non-forensic testing and  these results should be used only by healthcare  providers to render diagnosis or treatment, or to  monitor progress of medical conditions. Hazel Sams comments are:  - present when drug test results may be the result of     metabolism of one or more drugs or when results are     inconsistent with prescribed medication(s) listed.  - may be blank when drug results are consistent with     prescribed medication(s) listed. . For assistance with interpreting these d rug results,  please contact a [employer] Toxicology  Specialist: [phone] [place] [phone]), M-F,  8am-6pm EST. This drug testing is for medical treatment only.   Analysis was performed as non-forensic testing and  these results should be used only by healthcare  providers to render diagnosis or treatment, or to  monitor progress of medical conditions. Hazel Sams comments are:  - present when drug test results may be the result of     metabolism of one or more drugs or when results are     inconsistent with prescribed medication(s) listed.  - may be blank when drug results are consistent with     prescribed medication(s) listed. . For assistance with interpreting these drug results,  please contact a [employer] Toxicology  Specialist: [phone] [place] [phone]), M-F,  8am-6pm EST. This drug testing is for medical treatment only.   Analysis was performed as non-forensic testing and  these results should be used only by healthcare  provi ders to render diagnosis or treatment, or to  monitor progress of medical  conditions. Hazel Sams comments are:  - present when drug test results may be the result of     metabolism of one or more drugs or when results are     inconsistent with prescribed medication(s) listed.  - may be blank when drug results are consistent with     prescribed medication(s) listed. . For assistance with interpreting these drug results,  please contact a [employer] Toxicology  Specialist: [phone] [place] [phone]), M-F,  8am-6pm EST.   Comprehensive metabolic panel     Status: Abnormal   Collection Time: 10/08/17 10:30 AM  Result Value Ref Range   Sodium 134 (L) 135 - 145 mmol/L   Potassium 3.9 3.5 - 5.1 mmol/L   Chloride 100 98 - 111 mmol/L   CO2 25 22 - 32 mmol/L   Glucose, Bld 167 (H) 70 - 99 mg/dL   BUN 8 8 - 23 mg/dL   Creatinine, Ser 0.75 0.61 -  1.24 mg/dL   Calcium 9.1 8.9 - 10.3 mg/dL   Total Protein 6.5 6.5 - 8.1 g/dL   Albumin 3.6 3.5 - 5.0 g/dL   AST 24 15 - 41 U/L   ALT 32 0 - 44 U/L   Alkaline Phosphatase 116 38 - 126 U/L   Total Bilirubin 1.4 (H) 0.3 - 1.2 mg/dL   GFR calc non Af Amer >60 >60 mL/min   GFR calc Af Amer >60 >60 mL/min    Comment: (NOTE) The eGFR has been calculated using the CKD EPI equation. This calculation has not been validated in all clinical situations. eGFR's persistently <60 mL/min signify possible Chronic Kidney Disease.    Anion gap 9 5 - 15    Comment: Performed at [facility] [address], [place], [place] [address]   Assessment/Plan: Tonsil cancer ([place]) HPV +. Discussed continuation of roxicodone, using tylenol 250-500 mg in-between as needed. Follow-up with specialists as scheduled.  Anxiety state Will start Citalopram 10 mg QD x 2 weeks, increasing to 20 mg QD at that time. Continue Klonopin. PPI changed to Protonix which is safer with SSRIs. Follow-up scheduled.     Leeanne Rio, PA-C

## 2017-10-18 NOTE — Assessment & Plan Note (Signed)
Will start Citalopram 10 mg QD x 2 weeks, increasing to 20 mg QD at that time. Continue Klonopin. PPI changed to Protonix which is safer with SSRIs. Follow-up scheduled.

## 2017-10-22 ENCOUNTER — Telehealth: Payer: Self-pay | Admitting: Emergency Medicine

## 2017-10-22 NOTE — Telephone Encounter (Signed)
Copied from [place] [phone] [id number]. Topic: General - Other >> Oct 22, 2017  3:01 PM Bea Graff, NT wrote: Reason for CRM: Pt states he is undergoing test due to tonsil cancer from a virus. He states he is due for his flu shot, and he wants to know if he is able to have a flu shot due to the recent diagnosis of tonsil cancer.

## 2017-10-22 NOTE — Telephone Encounter (Signed)
Please advise 

## 2017-10-22 NOTE — Telephone Encounter (Signed)
Would recommend high dose flu shot. Is not live virus so safe to get.

## 2017-10-22 NOTE — Telephone Encounter (Signed)
Advised patient per PCP ok to get the high dose flu shot. Scheduled for nurse visit.

## 2017-10-23 ENCOUNTER — Other Ambulatory Visit (HOSPITAL_COMMUNITY): Payer: Self-pay | Admitting: *Deleted

## 2017-10-23 ENCOUNTER — Encounter: Payer: Self-pay | Admitting: Physician Assistant

## 2017-10-23 DIAGNOSIS — C760 Malignant neoplasm of head, face and neck: Secondary | ICD-10-CM | POA: Diagnosis not present

## 2017-10-23 MED ORDER — METOPROLOL TARTRATE 25 MG PO TABS: 12.5000 mg | ORAL_TABLET | Freq: Two times a day (BID) | ORAL | 5 refills | Status: DC

## 2017-10-25 ENCOUNTER — Other Ambulatory Visit: Payer: Self-pay | Admitting: Physician Assistant

## 2017-10-28 ENCOUNTER — Ambulatory Visit: Payer: Medicare Other

## 2017-10-28 ENCOUNTER — Other Ambulatory Visit: Payer: Self-pay | Admitting: Gastroenterology

## 2017-10-28 DIAGNOSIS — R1314 Dysphagia, pharyngoesophageal phase: Secondary | ICD-10-CM

## 2017-10-28 DIAGNOSIS — Z86010 Personal history of colonic polyps: Secondary | ICD-10-CM

## 2017-10-28 DIAGNOSIS — K219 Gastro-esophageal reflux disease without esophagitis: Secondary | ICD-10-CM

## 2017-10-29 ENCOUNTER — Ambulatory Visit
Admission: RE | Admit: 2017-10-29 | Discharge: 2017-10-29 | Disposition: A | Discharge status: Home / Self Care | Payer: Self-pay | Source: Ambulatory Visit | Attending: Radiation Oncology | Admitting: Radiation Oncology

## 2017-10-29 ENCOUNTER — Encounter: Payer: Self-pay | Admitting: Radiation Oncology

## 2017-10-29 ENCOUNTER — Telehealth: Payer: Self-pay | Admitting: *Deleted

## 2017-10-29 ENCOUNTER — Other Ambulatory Visit: Payer: Self-pay | Admitting: *Deleted

## 2017-10-29 DIAGNOSIS — Z87891 Personal history of nicotine dependence: Secondary | ICD-10-CM | POA: Diagnosis not present

## 2017-10-29 DIAGNOSIS — C099 Malignant neoplasm of tonsil, unspecified: Secondary | ICD-10-CM

## 2017-10-30 ENCOUNTER — Ambulatory Visit (INDEPENDENT_AMBULATORY_CARE_PROVIDER_SITE_OTHER): Payer: Medicare Other | Admitting: *Deleted

## 2017-10-30 ENCOUNTER — Telehealth: Payer: Self-pay | Admitting: *Deleted

## 2017-10-30 DIAGNOSIS — Z23 Encounter for immunization: Secondary | ICD-10-CM

## 2017-10-30 NOTE — Telephone Encounter (Signed)
Oncology Nurse Navigator Documentation  Spoke with Brandon [name], informed him of 10/1 10:30/11:00 RadOnc appt, pending appt with MedOnc and Dental Medicine. Answered his questions re chemotherapy, general timeline for starting tmt, my role as his navigator. He voiced understanding of information provided.  Gayleen Orem, RN, BSN Head & Neck Oncology Nurse [place] at [place] [phone]

## 2017-10-30 NOTE — Progress Notes (Signed)
Head and Neck Cancer Location of Tumor / Histology:  10/09/17 Diagnosis Tonsil, biopsy, Left - SQUAMOUS CELL CARCINOMA P16 immunohistochemistry is POSITIVE  Patient presented  months ago with symptoms of:  Dr. Noreene Filbert original consult note from 09/20/17: Beginning roughly 2 months ago, he has noticed pain and pressure in his left ear. His primary physician thought there was fluid and gave him amoxicillin which did not help. He began developing pressure in his left upper neck and received another course of amoxicillin which did not help. He now has some throat pain, ear pain, and pain when he opens his mouth, moves his tongue, or swallows. He thinks he can feel some bumps in his left ear canal. He is on chronic oxycodone for his pain. He has not really appreciated a neck mass.   Biopsies of left tonsil revealed: squamous cell carcinoma  Nutrition Status Yes No Comments  Weight changes? [x]  []  Maybe a few pounds.   Swallowing concerns? [x]  []  He can only open his mouth a small amount, and needs to cut up his food well.   PEG? []  [x]     Referrals Yes No Comments  Social Work? []  [x]    Dentistry? []  [x]    Swallowing therapy? []  [x]    Nutrition? []  [x]    Med/Onc? [x]  []  Dr. Maylon Peppers 11/06/17   Safety Issues Yes No Comments  Prior radiation? []  [x]    Pacemaker/ICD? []  [x]    Possible current pregnancy? []  [x]    Is the patient on methotrexate? []  [x]     Tobacco/Marijuana/Snuff/ETOH use: He drinks alcohol occasionally. He quit smoking cigarettes about one month ago.  Past/Anticipated interventions by otolaryngology, if any:  10/09/17 Proc: Direct laryngoscopy with biopsy, esophagoscopy, bronchoscopy  Surg:  Jodi Marble T MD  4/31/54 Dr. 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 There does not appear to be any cervical lymphadenopathy on scans or exam. The tumor appears endophytic and is quite painful. Treatment options were reviewed. These include  initiating treatment with surgery (followed by adjuvant therapy as indicated by pathology results) vs beginning with Radiotherapy +/- chemotherapy (with surgery reserved for salvage). Pros and cons of these approaches were discussed.   Surgery in this case would be: transcervical/transhyoid resection of the primary tumor, left neck dissection. 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 This would require a tracheotomy and a feeding tube.  I suspect that RT would be recommended postoperatively  Risks of this procedure have been discussed. These risks include but are not limited to, risks of anesthesia such as heart attack, stroke, death; risks of any surgery such as bleeding, infection, blood clots, pneumonia; risks of this particular surgery including risk of the TORS such as pain, difficulty swallowing with need for feeding tube which may be prolonged, airway swelling with need for prolonged intubation or tracheotomy placement, changes in speech, injury to the teeth, gums, lips, or tongue, inability to remove the entire tumor which may require conversion to an "open" procedure; risks of the neck dissection such as numbness, poor cosmetic appearance, weakness of nerves to the shoulder, lip, tongue, or larynx, chyle leak; risks of any tumor surgery, such as recurrence, need for further procedures or other treatments.   He is leaning away from surgery but would like to meet with radiation oncology to hear about this option.     Past/Anticipated interventions by medical oncology, if any:  Scheduled tomorrow 11/06/17 with Dr. Maylon Peppers   Current Complaints / other details:  BP (!) 156/79 (BP Location: Right Arm, Patient Position: Sitting)   Pulse 66   Temp 98.3 F (36.8 C) (Oral)   Resp 20   Ht 5\' 7"  (1.702 m)   Wt 172 lb 9.6 oz (78.3 kg)   SpO2 97%   BMI 27.03 kg/m    Wt Readings from Last 3 Encounters:  11/05/17 172 lb 9.6 oz (78.3 kg)   10/18/17 173 lb 6.4 oz (78.7 kg)  10/09/17 173 lb 8 oz (78.7 kg)

## 2017-10-30 NOTE — Telephone Encounter (Signed)
Oncology Nurse Navigator Documentation  Spoke with patient, informed him of 10/2 1:00 appt with Dr. Maylon Peppers.  He voiced understanding.  Gayleen Orem, RN, BSN Head & Neck Oncology Nurse [place] at [place] [phone]

## 2017-11-05 ENCOUNTER — Other Ambulatory Visit: Payer: Self-pay | Admitting: Physician Assistant

## 2017-11-05 ENCOUNTER — Telehealth: Payer: Self-pay | Admitting: *Deleted

## 2017-11-05 ENCOUNTER — Ambulatory Visit
Admission: RE | Admit: 2017-11-05 | Discharge: 2017-11-05 | Disposition: A | Discharge status: Home / Self Care | Payer: Self-pay | Source: Ambulatory Visit | Attending: Radiation Oncology | Admitting: Radiation Oncology

## 2017-11-05 ENCOUNTER — Other Ambulatory Visit: Payer: Self-pay

## 2017-11-05 ENCOUNTER — Encounter: Payer: Self-pay | Admitting: Radiation Oncology

## 2017-11-05 ENCOUNTER — Encounter: Payer: Self-pay | Admitting: *Deleted

## 2017-11-05 ENCOUNTER — Ambulatory Visit
Admission: RE | Admit: 2017-11-05 | Discharge: 2017-11-05 | Disposition: A | Discharge status: Home / Self Care | Payer: Medicare Other | Source: Ambulatory Visit | Attending: Radiation Oncology | Admitting: Radiation Oncology

## 2017-11-05 ENCOUNTER — Other Ambulatory Visit: Payer: Self-pay | Admitting: Radiation Oncology

## 2017-11-05 VITALS — BP 156/79 | HR 66 | Temp 98.3°F | Resp 20 | Ht 67.0 in | Wt 172.6 lb

## 2017-11-05 DIAGNOSIS — Z7982 Long term (current) use of aspirin: Secondary | ICD-10-CM | POA: Insufficient documentation

## 2017-11-05 DIAGNOSIS — M159 Polyosteoarthritis, unspecified: Secondary | ICD-10-CM

## 2017-11-05 DIAGNOSIS — Z886 Allergy status to analgesic agent status: Secondary | ICD-10-CM | POA: Insufficient documentation

## 2017-11-05 DIAGNOSIS — Z1329 Encounter for screening for other suspected endocrine disorder: Secondary | ICD-10-CM

## 2017-11-05 DIAGNOSIS — C090 Malignant neoplasm of tonsillar fossa: Secondary | ICD-10-CM | POA: Diagnosis not present

## 2017-11-05 DIAGNOSIS — G8929 Other chronic pain: Secondary | ICD-10-CM | POA: Insufficient documentation

## 2017-11-05 DIAGNOSIS — Z79891 Long term (current) use of opiate analgesic: Secondary | ICD-10-CM | POA: Insufficient documentation

## 2017-11-05 DIAGNOSIS — Z79899 Other long term (current) drug therapy: Secondary | ICD-10-CM | POA: Insufficient documentation

## 2017-11-05 DIAGNOSIS — M150 Primary generalized (osteo)arthritis: Principal | ICD-10-CM

## 2017-11-05 DIAGNOSIS — Z8249 Family history of ischemic heart disease and other diseases of the circulatory system: Secondary | ICD-10-CM | POA: Diagnosis not present

## 2017-11-05 DIAGNOSIS — F17211 Nicotine dependence, cigarettes, in remission: Secondary | ICD-10-CM | POA: Diagnosis not present

## 2017-11-05 DIAGNOSIS — M8949 Other hypertrophic osteoarthropathy, multiple sites: Secondary | ICD-10-CM

## 2017-11-05 DIAGNOSIS — R070 Pain in throat: Secondary | ICD-10-CM | POA: Diagnosis not present

## 2017-11-05 DIAGNOSIS — C099 Malignant neoplasm of tonsil, unspecified: Secondary | ICD-10-CM

## 2017-11-05 DIAGNOSIS — M549 Dorsalgia, unspecified: Secondary | ICD-10-CM | POA: Insufficient documentation

## 2017-11-05 MED ORDER — LIDOCAINE VISCOUS HCL 2 % MT SOLN: OROMUCOSAL | 5 refills | Status: DC

## 2017-11-05 NOTE — Telephone Encounter (Signed)
Copied from [place] [phone]. Topic: Quick Communication - Rx Refill/Question >> Nov 05, 2017  8:14 AM Scherrie Gerlach wrote: Medication:  oxyCODONE (OXY IR/ROXICODONE) 5 MG immediate release tablet [facility] [place], [address] AT [place] [phone] (Phone) [phone] (Fax)

## 2017-11-05 NOTE — Progress Notes (Signed)
Radiation Oncology         [phone] ________________________________  Initial Outpatient Consultation  Name: Brandon [name] MRN: [id number]  Date: 11/05/2017  DOB: [birth year]  TK:[id number], Luanna Cole, PA-C  Jodi Marble, MD   REFERRING PHYSICIAN: Jodi Marble, MD  DIAGNOSIS:    ICD-10-CM   1. Screening for hypothyroidism Z13.29 TSH    T4, free  2. 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 male who presented to his PCP about 4 months ago with pain and pressure in his left ear. His PCP thought there was fluid and gave him amoxicillin which did not help. The patient began developing pressure in his left upper neck and received another course of amoxicillin which did not help. He developed throat pain and pain with opening his mouth, moving his tongue, and swallowing.   Subsequently, the patient saw Dr. Erik Obey on 04/27/53 who ordered Neck CT performed on 09/26/17 which showed a 2.7 cm left tonsil mass most consistent with carcinoma. There is extension along the glossotonsillar sulcus and mass is indistinguishable from the superior margin of the submandibular gland. Negative for adenopathy.  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 The  carcinoma is moderately to poorly differentiated with basaloid features. P16 immunohistochemistry is POSITIVE.  Pertinent imaging thus far includes PET scan performed on 10/23/17 revealing an ill-defined left tonsillar lesion showing focal tracer uptake (SUV 4.5).   No definite evidence of FDG avid adenopathy or distant metastasis.   The patient met with Dr. Nicolette Bang on 10/29/17 to discuss treatment options including surgery or radiotherapy +/- chemotherapy. The patient is leaning away form surgery and has been referred today for discussion of potential radiation treatment options. He is accompanied by his wife. We are also joined in consultation by Gayleen Orem, RN, our Head and Neck Navigator.  Swallowing issues, if any: He reports he can only open his mouth a small amount and needs to cut up his food well.  Weight Changes: He reports he has lost "maybe a few pounds".  Pain status: He reports sore throat with pain and pressure in the left ear, causing difficulty with opening his mouth (as described above). He is on long-term oxycodone for chronic back pain.   Other symptoms: Patient's wife reports he has a lot of anxiety related to his diagnosis and his father's passing last night. He reports excessive sweating during the day.  Tobacco history, if any: He quit smoking cigarettes about one month ago with the aid of 21 mg nicotine patch. He states that the 21 mg patch worked well for him until recently the skin underneath started itching.  ETOH abuse, if any: He  drinks alcohol occasionally. He states that it soothes his throat and helps him fall asleep. He denies any heavy drinking.  Prior cancers, if any: He denies.  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    PAST SURGICAL HISTORY: Past Surgical History:  Procedure Laterality Date  . BACK SURGERY    . CATARACT EXTRACTION W/ INTRAOCULAR LENS  IMPLANT, BILATERAL Bilateral 01/2012 - 02/2012  . DIRECT LARYNGOSCOPY Left 10/09/2017   Procedure: DIRECT LARYNGOSCOPY WITH BOPSY;  Surgeon: Jodi Marble, MD;  Location: [facility];  Service: ENT;  Laterality: Left;  . ESOPHAGOGASTRODUODENOSCOPY (EGD) WITH ESOPHAGEAL DILATION     "couple times" (07/19/2016)  . ESOPHAGOSCOPY Left 10/09/2017   Procedure: ESOPHAGOSCOPY;  Surgeon: Jodi Marble, MD;  Location: [facility];  Service: ENT;  Laterality: Left;  . IR THORACENTESIS ASP PLEURAL SPACE W/IMG GUIDE  07/19/2016  . LAPAROSCOPIC CHOLECYSTECTOMY  1994  . LUMBAR DISC SURGERY  05/1996   L4-5; Dr. Sherwood Gambler   . LUMBAR LAMINECTOMY/DECOMPRESSION MICRODISCECTOMY  10/2002   L3-4. Dr. Sherwood Gambler  . MULTIPLE TOOTH EXTRACTIONS  1980s  . PICC LINE INSERTION  07/15/2016  . POSTERIOR LUMBAR FUSION  09/1996   Ray cage, L4-5 Dr. Rita Ohara  . PROSTATE BIOPSY  ~ 2017  . RIGID BRONCHOSCOPY Left 10/09/2017   Procedure: RIGID BRONCHOSCOPY;  Surgeon: Jodi Marble, MD;  Location: [facility];  Service: ENT;  Laterality: Left;    FAMILY HISTORY: family history includes Alzheimer's disease (age of onset: 52) in his mother; Arthritis in his brother; Arthritis/Rheumatoid in his mother; Brain cancer in his maternal aunt; Coronary artery disease in his father; Heart disease (age of onset: 69) in his father; Obesity in his daughter; Stomach cancer in his maternal uncle.  SOCIAL HISTORY:  reports that he quit smoking about 2 months ago. His smoking use included cigarettes. He has a 75.00 pack-year smoking history. He has never  used smokeless tobacco. He reports that he drinks alcohol. He reports that he does not use drugs. Married. Resides in [place]. Worked in [employer].  ALLERGIES: Celebrex [celecoxib]  MEDICATIONS:  Current Outpatient Medications  Medication Sig Dispense Refill  . amLODipine (NORVASC) 10 MG tablet Take 1 tablet (10 mg total) by mouth daily. 90 tablet 3  . aspirin 81 MG EC tablet Take 81 mg by mouth at bedtime.     . cholecalciferol (VITAMIN D) 1000 UNITS tablet Take 1,000 Units by mouth daily.     . citalopram (CELEXA) 20 MG tablet Take 1/2 tablet (27m) PO daily x 14 days. Then increase to 1 tablet (261m daily 30 tablet 3  . clonazePAM (KLONOPIN) 0.5 MG tablet TAKE 1 TABLET(0.5 MG) BY MOUTH TWICE DAILY AS NEEDED FOR ANXIETY 20 tablet 0  . isosorbide mononitrate (IMDUR) 30 MG 24 hr tablet TAKE 1 TABLET(30 MG) BY MOUTH AT BEDTIME 90 tablet 0  . metoprolol tartrate (LOPRESSOR) 25 MG tablet Take 0.5 tablets (12.5 mg total) by mouth 2 (two) times daily. 30 tablet 5  . Multiple Vitamins-Minerals (MULTIVITAMIN PO) Take 1 tablet by mouth daily.    . Omega-3 Fatty Acids (FISH OIL PO) Take by mouth 2 (two) times daily.    .Marland Kitchen  pantoprazole (PROTONIX) 40 MG tablet Take 1 tablet (40 mg total) by mouth daily. 30 tablet 3  . atorvastatin (LIPITOR) 40 MG tablet Take 1 tablet (40 mg total) by mouth daily. 90 tablet 3  . iron polysaccharides (NU-IRON) 150 MG capsule Take 1 capsule (150 mg total) by mouth 2 (two) times daily. (Patient not taking: Reported on 11/05/2017)    . lidocaine (XYLOCAINE) 2 % solution Mix 1part 2% viscous lidocaine, 1part H2O.Swish &/or swallow 67m of this mixture,374m before meals and at bedtime, up to QID 100 mL 5  . lisinopril (PRINIVIL,ZESTRIL) 10 MG tablet Take 1 tablet (10 mg total) by mouth daily. 90 tablet 3  . oxyCODONE (OXY IR/ROXICODONE) 5 MG immediate release tablet Take 1 tablet (5 mg total) by mouth every 6 (six) hours as needed for moderate pain. 60 tablet 0  . TURMERIC  PO Take by mouth.     No current facility-administered medications for this encounter.     REVIEW OF SYSTEMS:  A 10+ POINT REVIEW OF SYSTEMS WAS OBTAINED including neurology, dermatology, psychiatry, cardiac, respiratory, lymph, extremities, GI, GU, Musculoskeletal, constitutional, breasts, reproductive, HEENT.  All pertinent positives are noted in the HPI.  All others are negative.   PHYSICAL EXAM:  height is '5\' 7"'  (1.702 m) and weight is 172 lb 9.6 oz (78.3 kg). His oral temperature is 98.3 F (36.8 C). His blood pressure is 156/79 (abnormal) and his pulse is 66. His respiration is 20 and oxygen saturation is 97%.   General: Alert and oriented, in no acute distress. HEENT: Head is normocephalic. Extraocular movements are intact. Upper dentures removed for oral exam. I wasn't able to see or feel any obvious tumor in the oropharynx or oral cavity; gag reflex inhibited exam Neck: He does not have any palpable masses in his neck. Heart: Regular in rate and rhythm. He has a murmur heard throughout the heart that is loudest in the aortic valve region. Chest: Clear to auscultation bilaterally, with no rhonchi, wheezes, or rales. Abdomen: Soft, nontender, nondistended, with no rigidity or guarding. Extremities: No cyanosis or edema. Lymphatics: see Neck Exam Skin: No concerning lesions. Musculoskeletal: Symmetric strength and muscle tone throughout. Neurologic: Cranial nerves II through XII are grossly intact. No obvious focalities. Speech is fluent. Coordination is intact. Psychiatric: Judgment and insight are intact. Affect is appropriate.  ECOG = 1  0 - Asymptomatic (Fully active, able to carry on all predisease activities without restriction)  1 - Symptomatic but completely ambulatory (Restricted in physically strenuous activity but ambulatory and able to carry out work of a light or sedentary nature. For example, light housework, office work)  2 - Symptomatic, <50% in bed during the day  (Ambulatory and capable of all self care but unable to carry out any work activities. Up and about more than 50% of waking hours)  3 - Symptomatic, >50% in bed, but not bedbound (Capable of only limited self-care, confined to bed or chair 50% or more of waking hours)  4 - Bedbound (Completely disabled. Cannot carry on any self-care. Totally confined to bed or chair)  5 - Death   OkEustace PenM, Creech RH, Tormey DC, et al. ([phone] "Toxicity and response criteria of the [facility]". AmTuscaloosancol. 5 (6): 649-55   LABORATORY DATA:  Lab Results  Component Value Date   WBC 7.5 02/13/2017   HGB 16.1 02/13/2017   HCT 47.1 02/13/2017   MCV 89.3 02/13/2017   PLT 128.0 (L) 02/13/2017  CMP     Component Value Date/Time   NA 134 (L) 10/08/2017 1030   K 3.9 10/08/2017 1030   CL 100 10/08/2017 1030   CO2 25 10/08/2017 1030   GLUCOSE 167 (H) 10/08/2017 1030   BUN 8 10/08/2017 1030   BUN 7 08/01/2016   CREATININE 0.75 10/08/2017 1030   CREATININE 0.76 05/15/2013 1042   CALCIUM 9.1 10/08/2017 1030   PROT 6.5 10/08/2017 1030   ALBUMIN 3.6 10/08/2017 1030   AST 24 10/08/2017 1030   ALT 32 10/08/2017 1030   ALKPHOS 116 10/08/2017 1030   BILITOT 1.4 (H) 10/08/2017 1030   GFRNONAA >60 10/08/2017 1030   GFRAA >60 10/08/2017 1030      Lab Results  Component Value Date   TSH 0.95 07/04/2016     RADIOGRAPHY: As above in HPI. I personally reviewed his images.     IMPRESSION/PLAN:  This is a delightful patient with tonsillar cancer. I do recommend radiotherapy for this patient, +/- chemotherapy depending on Dr. Lorette Ang recommendations.  His stage is at least T2N0, though Dr. Nicolette Bang had concerns about pterygoid invasion.  Will discuss at tumor board.  While p16+, the endophytic nature of his tumor implies that it may be more resistant to radiation than an exophytic p16+ cancer.  Patient declined the option of surgical resection at [facility]. Therefore, I recommed 7  weeks of definitive radiation treatment. He will be discussed at tumor board tomorrow and will be called with our final recommendations.  We discussed the potential risks, benefits, and side effects of radiotherapy. We talked in detail about acute and late effects. We discussed that some of the most bothersome acute effects may be mucositis, dysgeusia, salivary changes, skin irritation, hair loss, dehydration, weight loss and fatigue. We talked about late effects which include but are not necessarily limited to dysphagia, hypothyroidism, nerve injury, spinal cord injury, xerostomia, trismus, and neck edema. No guarantees of treatment were given. A consent form was signed and placed in the patient's medical record. The patient is enthusiastic about proceeding with treatment. I look forward to participating in the patient's care.    Simulation (treatment planning) will take place after he is cleared by Dr. Enrique Sack, [place]. The patient will begin treatment one week after.  We also discussed that the treatment of head and neck cancer is a multidisciplinary process to maximize treatment outcomes and quality of life. For this reasons the following referrals have been or will be made:  1. Medical oncology to discuss chemotherapy. The patient is scheduled to see Dr. Maylon Peppers tomorrow.  2. Dentistry for dental evaluation, possible extractions in the radiation fields, and /or advice on reducing risk of cavities, osteoradionecrosis, or other oral issues. The patient will see Dr. Enrique Sack tomorrow.  3. Nutritionist for nutrition support during and after treatment. We discussed that he may require supplementation with the use of a PEG tube.  4. Speech language pathology for swallowing and/or speech therapy.  5. Social work for social support and financial counseling.   6. Physical therapy due to risk of lymphedema in neck and deconditioning.  7. Baseline labs   I asked the patient today about tobacco and alcohol  use. The patient has discontinued tobacco use.  I advised the patient to continue using the nicotine patch and apply the 14 mg patch for now, then the 7 mg patch. The patient has continued to drink Baileys to help ease his pain. We discussed stopping all alcohol use and instead try eating ice  cream. He is on oxycodone for chronic back pain. I will also send in prescription for lidocaine to help ease his throat pain. His preferred pharmacy is [facility] on [address].    __________________________________________   Eppie Gibson, MD  This document serves as a record of services personally performed by Eppie Gibson, MD. It was created on her behalf by Rae Lips, a trained medical scribe. The creation of this record is based on the scribe's personal observations and the provider's statements to them. This document has been checked and approved by the attending provider.

## 2017-11-05 NOTE — Telephone Encounter (Signed)
Indication for chronic opioid: Osteoarthritis Medication and dose: Oxycodone 5mg  # pills per month: 60 Last UDS date: 09/22/17 Opioid Treatment Agreement signed (Y/N): Yes, 10/02/17 Opioid Treatment Agreement last reviewed with patient:   NCCSRS reviewed this encounter (include red flags):    LOV:10/18/17 Please advise

## 2017-11-05 NOTE — Progress Notes (Signed)
Oncology Nurse Navigator Documentation  Met with Mr. Hoge during initial consult with Dr. Isidore Moos.  He was accompanied by his wife.  . Further introduced myself as his Navigator, explained my role as a member of the Care Team.   . Provided New Patient Information packet, discussed contents: o Contact information for physician(s), myself, other members of the Care Team o Advance Directive information ([place] blue pamphlet with LCSW contact info), provided [facility] AD booklet/forms o Fall Prevention Patient Safety Plan o Appointment Guideline o [place] o [place] [facility] map with highlight of [place] o SLP information sheet . Provided introductory explanation of radiation treatment including SIM planning and purpose of Aquaplast head and shoulder mask, showed them example.   . Provided and discussed education handout for PEG.  . Provided a tour of SIM and XRT areas, explained treatment and arrival procedures. . Escorted them to [place] as reference for tomorrow's appt. . Discussed his attendance at the 8/8 [facility] [place] to meet other clinical team members discussed by Dr. Isidore Moos, he voiced interest, I provided [phone] arrival to Radiation Waiting following lobby registration. . I encouraged them to contact me with questions/concerns as treatments/procedures begin.  They verbalized understanding of information provided.    Gayleen Orem, RN, BSN Head & Neck Oncology Nurse [facility] [place] [phone]

## 2017-11-05 NOTE — Addendum Note (Signed)
Addended by: Leonidas Romberg on: 11/05/2017 01:15 PM   Modules accepted: Orders

## 2017-11-05 NOTE — Telephone Encounter (Signed)
CALLED PATIENT TO ASK ABOUT COMING IN FOR LABS, PT. AGREED TO COME ON 11-06-17 @ 9:45 AM

## 2017-11-06 ENCOUNTER — Encounter: Payer: Self-pay | Admitting: Radiation Oncology

## 2017-11-06 ENCOUNTER — Inpatient Hospital Stay: Payer: Medicare Other | Attending: Hematology | Admitting: Hematology

## 2017-11-06 ENCOUNTER — Telehealth (HOSPITAL_COMMUNITY): Payer: Self-pay

## 2017-11-06 ENCOUNTER — Other Ambulatory Visit: Payer: Self-pay

## 2017-11-06 ENCOUNTER — Telehealth: Payer: Self-pay | Admitting: *Deleted

## 2017-11-06 ENCOUNTER — Ambulatory Visit (HOSPITAL_COMMUNITY): Payer: Self-pay | Admitting: Dentistry

## 2017-11-06 ENCOUNTER — Encounter: Payer: Self-pay | Admitting: *Deleted

## 2017-11-06 ENCOUNTER — Ambulatory Visit
Admission: RE | Admit: 2017-11-06 | Discharge: 2017-11-06 | Disposition: A | Discharge status: Home / Self Care | Payer: Medicare Other | Source: Ambulatory Visit | Attending: Radiation Oncology | Admitting: Radiation Oncology

## 2017-11-06 ENCOUNTER — Encounter (HOSPITAL_COMMUNITY): Payer: Self-pay | Admitting: Dentistry

## 2017-11-06 VITALS — BP 159/67 | HR 60 | Temp 98.3°F

## 2017-11-06 DIAGNOSIS — Z8673 Personal history of transient ischemic attack (TIA), and cerebral infarction without residual deficits: Secondary | ICD-10-CM

## 2017-11-06 DIAGNOSIS — Z01818 Encounter for other preprocedural examination: Secondary | ICD-10-CM | POA: Diagnosis not present

## 2017-11-06 DIAGNOSIS — Z808 Family history of malignant neoplasm of other organs or systems: Secondary | ICD-10-CM | POA: Diagnosis not present

## 2017-11-06 DIAGNOSIS — F1290 Cannabis use, unspecified, uncomplicated: Secondary | ICD-10-CM

## 2017-11-06 DIAGNOSIS — M8949 Other hypertrophic osteoarthropathy, multiple sites: Secondary | ICD-10-CM

## 2017-11-06 DIAGNOSIS — K750 Abscess of liver: Secondary | ICD-10-CM

## 2017-11-06 DIAGNOSIS — M150 Primary generalized (osteo)arthritis: Secondary | ICD-10-CM

## 2017-11-06 DIAGNOSIS — Z1329 Encounter for screening for other suspected endocrine disorder: Secondary | ICD-10-CM | POA: Diagnosis not present

## 2017-11-06 DIAGNOSIS — C090 Malignant neoplasm of tonsillar fossa: Secondary | ICD-10-CM | POA: Insufficient documentation

## 2017-11-06 DIAGNOSIS — I10 Essential (primary) hypertension: Secondary | ICD-10-CM

## 2017-11-06 DIAGNOSIS — A409 Streptococcal sepsis, unspecified: Secondary | ICD-10-CM

## 2017-11-06 DIAGNOSIS — Z800 Family history of malignant neoplasm of digestive organs: Secondary | ICD-10-CM

## 2017-11-06 DIAGNOSIS — G8929 Other chronic pain: Secondary | ICD-10-CM | POA: Insufficient documentation

## 2017-11-06 DIAGNOSIS — F17290 Nicotine dependence, other tobacco product, uncomplicated: Secondary | ICD-10-CM | POA: Diagnosis not present

## 2017-11-06 DIAGNOSIS — M159 Polyosteoarthritis, unspecified: Secondary | ICD-10-CM

## 2017-11-06 DIAGNOSIS — Z7982 Long term (current) use of aspirin: Secondary | ICD-10-CM | POA: Diagnosis not present

## 2017-11-06 DIAGNOSIS — Z79899 Other long term (current) drug therapy: Secondary | ICD-10-CM | POA: Diagnosis not present

## 2017-11-06 DIAGNOSIS — M270 Developmental disorders of jaws: Secondary | ICD-10-CM

## 2017-11-06 DIAGNOSIS — K08109 Complete loss of teeth, unspecified cause, unspecified class: Secondary | ICD-10-CM

## 2017-11-06 DIAGNOSIS — I2510 Atherosclerotic heart disease of native coronary artery without angina pectoris: Secondary | ICD-10-CM | POA: Diagnosis not present

## 2017-11-06 DIAGNOSIS — Z972 Presence of dental prosthetic device (complete) (partial): Secondary | ICD-10-CM

## 2017-11-06 DIAGNOSIS — C099 Malignant neoplasm of tonsil, unspecified: Secondary | ICD-10-CM

## 2017-11-06 DIAGNOSIS — R6520 Severe sepsis without septic shock: Secondary | ICD-10-CM

## 2017-11-06 DIAGNOSIS — K0820 Unspecified atrophy of edentulous alveolar ridge: Secondary | ICD-10-CM

## 2017-11-06 LAB — CBC WITH DIFFERENTIAL/PLATELET
BASOS ABS: 0 10*3/uL (ref 0.0–0.1)
Basophils Relative: 1 %
Eosinophils Absolute: 0.7 10*3/uL — ABNORMAL HIGH (ref 0.0–0.5)
Eosinophils Relative: 11 %
HEMATOCRIT: 42.3 % (ref 38.4–49.9)
Hemoglobin: 14.3 g/dL (ref 13.0–17.1)
LYMPHS ABS: 1.3 10*3/uL (ref 0.9–3.3)
Lymphocytes Relative: 21 %
MCH: 30.1 pg (ref 27.2–33.4)
MCHC: 33.8 g/dL (ref 32.0–36.0)
MCV: 88.9 fL (ref 79.3–98.0)
MONOS PCT: 7 %
Monocytes Absolute: 0.4 10*3/uL (ref 0.1–0.9)
NEUTROS PCT: 60 %
Neutro Abs: 4 10*3/uL (ref 1.5–6.5)
Platelets: 135 10*3/uL — ABNORMAL LOW (ref 140–400)
RBC: 4.75 MIL/uL (ref 4.20–5.82)
RDW: 13.8 % (ref 11.0–14.6)
WBC: 6.6 10*3/uL (ref 4.0–10.3)

## 2017-11-06 LAB — COMPREHENSIVE METABOLIC PANEL
ALBUMIN: 3.8 g/dL (ref 3.5–5.0)
ALK PHOS: 136 U/L — AB (ref 38–126)
ALT: 31 U/L (ref 0–44)
ANION GAP: 8 (ref 5–15)
AST: 24 U/L (ref 15–41)
BILIRUBIN TOTAL: 0.8 mg/dL (ref 0.3–1.2)
BUN: 8 mg/dL (ref 8–23)
CALCIUM: 9.4 mg/dL (ref 8.9–10.3)
CO2: 30 mmol/L (ref 22–32)
Chloride: 99 mmol/L (ref 98–111)
Creatinine, Ser: 0.74 mg/dL (ref 0.61–1.24)
GFR calc Af Amer: >60 mL/min (ref >60)
GLUCOSE: 108 mg/dL — AB (ref 70–99)
POTASSIUM: 4.1 mmol/L (ref 3.5–5.1)
Sodium: 137 mmol/L (ref 135–145)
TOTAL PROTEIN: 6.8 g/dL (ref 6.5–8.1)

## 2017-11-06 LAB — T4, FREE: FREE T4: 1.04 ng/dL (ref 0.82–1.77)

## 2017-11-06 LAB — TSH: TSH: 0.725 u[IU]/mL (ref 0.320–4.118)

## 2017-11-06 LAB — MAGNESIUM: Magnesium: 1.8 mg/dL (ref 1.7–2.4)

## 2017-11-06 MED ORDER — OXYCODONE HCL 5 MG PO TABS: 5.0000 mg | ORAL_TABLET | Freq: Four times a day (QID) | ORAL | 0 refills | Status: DC | PRN

## 2017-11-06 NOTE — Telephone Encounter (Signed)
Opened in error

## 2017-11-06 NOTE — Patient Instructions (Signed)
Patient:            Brandon [name] Date of Birth:  [birth year] MRN:                [id number]   RADIATION THERAPY AND DECISIONS REGARDING YOUR TEETH  Xerostomia (dry mouth) Your salivary glands may be in the filed of radiation.  Radiation may include all or part of your saliva glands.  This will cause your saliva to dry up and you will have a dry mouth.  The dry mouth will be for the rest of your life unless your radiation oncologist tells you otherwise.  Your saliva has many functions:  Saliva wets your tongue for speaking.  It coats your teeth and the inside of your mouth for easier movement.  It helps with chewing and swallowing food.  It helps clean away harmful acid and toxic products made by the germs in your mouth, therefore it helps prevent cavities.  It kills some germs in your mouth and helps to prevent gum disease.  It helps to carry flavor to your taste buds.  Once you have lost your saliva you will be at higher risk for tooth decay and gum disease.  What can be done to help improve your mouth when there's not enough saliva:  1.  Your dentist may give a prescription for Salagen.  It will not bring back all of your saliva but may bring back some of it.  Also your saliva may be thick and ropy or white and foamy. It will not feel like it use to feel.  2.  You will need to swish with water every time your mouth feels dry.  YOU CANNOT suck on any cough drops, mints, lemon drops, candy, vitamin C or any other products.  You cannot use anything other than water to make your mouth feel less dry.  If you want to drink anything else you have to drink it all at once and brush afterwards.  Be sure to discuss the details of your diet habits with your dentist or hygienist.  Radiation caries: This is decay that happens very quickly once your mouth is very dry due to radiation therapy.  Normally cavities take six months to two years to become a problem.  When you have dry mouth cavities may  take as little as eight weeks to cause you a problem.  This is why dental check ups every two months are necessary as long as you have a dry mouth. Radiation caries typically, but not always, start at your gum line where it is hard to see the cavity.  It is therefore also hard to fill these cavities adequately.  This high rate of cavities happens because your mouth no longer has saliva and therefore the acid made by the germs starts the decay process.  Whenever you eat anything the germs in your mouth change the food into acid.  The acid then burns a small hole in your tooth.  This small hole is the beginning of a cavity.  If this is not treated then it will grow bigger and become a cavity.  The way to avoid this hole getting bigger is to use fluoride every evening as prescribed by your dentist.  You have to make sure that your teeth are very clean before you use the fluoride.  This fluoride in turn will strengthen your teeth and prepare them for another day of fighting acid.  If you develop radiation caries many times the damage  is so large that you will have to have all your teeth removed.  This could be a big problem if some of these teeth are in the field of radiation.  Further details of why this could be a big problem will follow.  (See Osteoradionecrosis).  Loss of taste (dysgeusia) This happens to varying degrees once you've had radiation therapy to your jaw region.  Many times taste is not completely lost but becomes limited.  The loss of taste is mostly due to radiation affecting your taste buds.  However if you have no saliva in your mouth to carry the flavor to your taste buds it would be difficult for your taste buds to taste anything.  That is why using water or a prescription for Salagen prior to meals and during meals may help with some of the taste.  Keep in mind that taste generally returns very slowly over the course of several months or several years after radiation therapy.  Don't give up  hope.  Trismus According to your Radiation Oncologist your TMJ or jaw joints are going to be partially or fully in the field of radiation.  This means that over time the muscles that help you open and close your mouth may get stiff.  This will potentially result in your not being able to open your mouth wide enough or as wide as you can open it now.  Le me give you an example of how slowly this happens and how unaware people are of it.  A gentlemen that had radiation therapy two years ago came back to me complaining that bananas are just too large for him to be able to fit them in between his teeth.  He was not able to open wide enough to bite into a banana.  This happens slowly and over a period of time.  What do we do to try and prevent this?  Your dentist will probably give you a stack of sticks called a trismus exercise device .  This stack will help your remind your muscles and your jaw joint to open up to the same distance every day.  Use these sticks every morning when you wake up according to the instructions given by the dentist.   You must use these sticks for at least one to two years after radiation therapy.  The reason for that is because it happens so slowly and keeps going on for about two years after radiation therapy.  Your hospital dentist will help you monitor your mouth opening and make sure that it's not getting smaller.  Osteoradionecrosis (ORN) This is a condition where your jaw bone after having had radiation therapy becomes very dry.  It has very little blood supply to keep it alive.  If you develop a cavity that turns into an abscess or an infection then the jaw bone does not have enough blood supply to help fight the infection.  At this point it is very likely that the infection could cause the death of your jaw bone.  When you have dead bone it has to be removed.  Therefore you might end up having to have surgery to remove part of your jaw bone, the part of the jaw bone that has  been affected.   Healing is also a problem if you are to have surgery in the areas where the bone has had radiation therapy.  The same reasons apply.  If you have surgery you need more blood supply which is not available.  When blood supply and oxygen are not available again, there is a chance for the bone to die.  Occasionally ORN happens on its own with no obvious reason.  This is quite rare.  We believe that patients who continue to smoke and/or drink alcohol have a higher chance of having this bone problem.  Therefore once your jaw bone has had radiation therapy if there are any teeth in that area, you should never have them pulled.  You should also never have any surgery on your teeth or gums in that area unless the oral surgeon or Periodontist is aware of your history of radiation. There is some expensive management techniques that might be used to limit your risks.  The risks for ORN either from infection or spontaneous ( or on it's own) are life long.    TRISMUS  Trismus is a condition where the jaw does not allow the mouth to open as wide as it usually does.  This can happen almost suddenly, or in other cases the process is so slow, it is hard to notice it-until it is too far along.  When the jaw joints and/or muscles have been exposed to radiation treatments, the onset of Trismus is very slow.  This is because the muscles are losing their stretching ability over a long period of time, as long as 2 YEARS after the end of radiation.  It is therefore important to exercise these muscles and joints.  TRISMUS EXERCISES   Stack of tongue depressors measuring the same or a little less than the last documented MIO (Maximum Interincisal Opening).  Secure them with a rubber band on both ends.  Place the stack in the patient's mouth, supporting the other end.  Allow 30 seconds for muscle stretching.  Rest for a few seconds.  Repeat 3-5 times  For all radiation patients, this exercise is  recommended in the mornings and evenings unless otherwise instructed.  The exercise should be done for a period of 2 YEARS after the end of radiation.  MIO should be checked routinely on recall dental visits by the general dentist or the hospital dentist.  The patient is advised to report any changes, soreness, or difficulties encountered when doing the exercises.

## 2017-11-06 NOTE — Assessment & Plan Note (Addendum)
-   I reviewed the patient's external records, including clinic notes and laboratory studies. - I also independently reviewed the patient's radiologic studies and agreed with the findings as documented. - The patient's case was discussed in detail at the head neck tumor board - As there is a clear fat plane between the pterygoid muscle and the primary site of the disease, and no evidence of LN involvement, this is consistent with cT2N0M0 tumor.  - Pt had been evaluated by Dr. Nicolette Bang in September 2019 at [facility], but he was undecided regarding surgery at that time. - I discussed with the patient at length regarding the rationale for surgery followed by adjuvant radiation or chemoradiation (as indicated) vs. definitive radiation/chemoradiation.   - In my opinion, if he is a surgical candidate, is willing to proceed with surgery, and the benefits of the surgery outweigh the risks, then upfront surgery, followed by adjuvant RT or chemoradiation as indicated, offers the best chance of long-term disease control.  However, if he is not a surgical candidate or morbidity associated with the surgery outweigh the benefits, then definitive radiation is a reasonable option. - In the absence of locally advanced disease or lymph node involvement, there is no indication for concurrent chemotherapy. - Patient has been evaluated by dental medicine and cleared for RT. - After extensive discussion and consideration, patient expressed interest to be evaluated at [facility] for upfront surgery; meanwhile, I will discuss with Dr. Isidore Moos regarding possible CT simulation in the event that surgery is not an option in order to initiate therapy as quickly as possible

## 2017-11-06 NOTE — Progress Notes (Signed)
DENTAL CONSULTATION  Date of Consultation:  11/06/2017 Patient Name:   Brandon [name] Date of Birth:   October 23, 1940 Medical Record Number: [id number]  VITALS: BP (!) 159/67 (BP Location: Right Arm)   Pulse 60   Temp 98.3 F (36.8 C)   CHIEF COMPLAINT: Patient referred by Dr. Isidore Moos for a dental consultation.  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 Patient with anticipated radiation therapy and possible chemotherapy. Patient is now seen as part of a pre-chemoradiation therapy dental protocol examination.  The patient indicates that he is edentulous. The patient had his remaining teeth extracted in the early 90's in [place]. Patient had upper and lower dentures made at that time. Patient wore those dentures for 12-13 years. Patient then had a new set of dentures fabricated in [facility] that he wore for approximately 12-13 years. Patient then went to [facility] approximately 5 years ago for a new set of dentures. These never fit well by patient report. Patient then went to [facility] about one year ago for fabrication of a new set of dentures. Patient indicates that these dentures "fit great".  Patient indicates that he does have to use significant amounts of denture adhesive to keep the lower denture in. The patient denies having dental phobia.  PROBLEM LIST: Patient Active Problem List   Diagnosis Date Noted  . Cancer of tonsillar fossa ([place]) 09/20/2017  . Bruising 10/02/2016  . 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 Aneurysm of infrarenal abdominal aorta ([facility]) 07/10/2016  . Normocytic anemia 07/10/2016  . Tobacco abuse disorder 01/10/2014  . Seasonal allergies 11/23/2013  . Essential hypertension 05/19/2013  . Osteoarthritis 05/29/2009  . 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 Aortic valve disorder 01/28/2008  . BRAIN STEM STROKE 08/27/2007  . 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 BENIGN PROSTATIC HYPERTROPHY, HX OF  08/27/2007  . Hyperlipidemia 03/19/2007  . Anxiety state 03/19/2007  . GERD 03/19/2007    PMH: Past Medical History:  Diagnosis Date  . Aortic stenosis    mild echocardiogram 8/09 EF 60%, showed no regional wall motion abnml, mild LVH, mod focal basal septal hypertrophy and mild dyastolic dysfunction. 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 mean gradient across aortic valve was 8 mmHG. also mild L atrial enlargement and normal RV size and function. minimal AS on LHC in 7/10.   . Arthritis    "all over" (07/19/2016)  . BPH (benign prostatic hypertrophy)   . CAD (coronary artery disease)    [facility] (7/10) totally occluded proximal RCA with very robuse L to R collaterals, 50% proximal LAD stenosis, EF 65%, medical management.    . Chicken pox   . Chronic lower back pain    s/p surgical fusion  . Diverticulosis   . Esophageal stricture   . GERD (gastroesophageal reflux disease)   . Heart murmur   . Hepatitis B 1984  . Hiatal hernia   . HLD (hyperlipidemia)   . HTN (hypertension)   . 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 Osteoarthritis   . TIA (transient ischemic attack) 1990s   hx  . Tubular adenoma of colon 2009    PSH: Past Surgical History:  Procedure Laterality Date  . BACK SURGERY    . CATARACT EXTRACTION W/ INTRAOCULAR LENS  IMPLANT, BILATERAL Bilateral 01/2012 - 02/2012  . DIRECT LARYNGOSCOPY Left 10/09/2017   Procedure: DIRECT LARYNGOSCOPY WITH BOPSY;  Surgeon: Jodi Marble, MD;  Location: [facility];  Service: ENT;  Laterality: Left;  . ESOPHAGOGASTRODUODENOSCOPY (EGD) WITH ESOPHAGEAL DILATION     "couple times" (07/19/2016)  . ESOPHAGOSCOPY Left 10/09/2017   Procedure: ESOPHAGOSCOPY;  Surgeon: Jodi Marble, MD;  Location: [place];  Service: ENT;  Laterality: Left;  . IR THORACENTESIS ASP PLEURAL SPACE W/IMG GUIDE  07/19/2016  . LAPAROSCOPIC CHOLECYSTECTOMY  1994  . LUMBAR DISC SURGERY  05/1996   L4-5; Dr.  Sherwood Gambler   . LUMBAR LAMINECTOMY/DECOMPRESSION MICRODISCECTOMY  10/2002   L3-4. Dr. Sherwood Gambler  . MULTIPLE TOOTH EXTRACTIONS  1980s  . PICC LINE INSERTION  07/15/2016  . POSTERIOR LUMBAR FUSION  09/1996   Ray cage, L4-5 Dr. Rita Ohara  . PROSTATE BIOPSY  ~ 2017  . RIGID BRONCHOSCOPY Left 10/09/2017   Procedure: RIGID BRONCHOSCOPY;  Surgeon: Jodi Marble, MD;  Location: [place];  Service: ENT;  Laterality: Left;    ALLERGIES: Allergies  Allergen Reactions  . Celebrex [Celecoxib] Hives and Itching    MEDICATIONS: Current Outpatient Medications  Medication Sig Dispense Refill  . amLODipine (NORVASC) 10 MG tablet Take 1 tablet (10 mg total) by mouth daily. 90 tablet 3  . aspirin 81 MG EC tablet Take 81 mg by mouth at bedtime.     . cholecalciferol (VITAMIN D) 1000 UNITS tablet Take 1,000 Units by mouth daily.     . citalopram (CELEXA) 20 MG tablet Take 1/2 tablet (10mg ) PO daily x 14 days. Then increase to 1 tablet (20mg ) daily 30 tablet 3  . clonazePAM (KLONOPIN) 0.5 MG tablet TAKE 1 TABLET(0.5 MG) BY MOUTH TWICE DAILY AS NEEDED FOR ANXIETY 20 tablet 0  . isosorbide mononitrate (IMDUR) 30 MG 24 hr tablet TAKE 1 TABLET(30 MG) BY MOUTH AT BEDTIME 90 tablet 0  . metoprolol tartrate (LOPRESSOR) 25 MG tablet Take 0.5 tablets (12.5 mg total) by mouth 2 (two) times daily. 30 tablet 5  . Multiple Vitamins-Minerals (MULTIVITAMIN PO) Take 1 tablet by mouth daily.    . Omega-3 Fatty Acids (FISH OIL PO) Take by mouth 2 (two) times daily.    Marland Kitchen oxyCODONE (OXY IR/ROXICODONE) 5 MG immediate release tablet Take 1 tablet (5 mg total) by mouth every 6 (six) hours as needed for moderate pain. 60 tablet 0  . pantoprazole (PROTONIX) 40 MG tablet Take 1 tablet (40 mg total) by mouth daily. 30 tablet 3  . atorvastatin (LIPITOR) 40 MG tablet Take 1 tablet (40 mg total) by mouth daily. 90 tablet 3  . iron polysaccharides (NU-IRON) 150 MG capsule Take 1 capsule (150 mg total) by mouth 2 (two) times  daily. (Patient not taking: Reported on 11/06/2017)    . lidocaine (XYLOCAINE) 2 % solution Mix 1part 2% viscous lidocaine, 1part H2O.Swish &/or swallow 67ml of this mixture,59min before meals and at bedtime, up to QID (Patient not taking: Reported on 11/06/2017) 100 mL 5  . lisinopril (PRINIVIL,ZESTRIL) 10 MG tablet Take 1 tablet (10 mg total) by mouth daily. 90 tablet 3  . TURMERIC PO Take by mouth.     No current facility-administered medications for this visit.     LABS: Lab Results  Component Value Date   WBC 6.6 11/06/2017   HGB 14.3 11/06/2017   HCT 42.3 11/06/2017   MCV 88.9 11/06/2017   PLT 135 (L) 11/06/2017      Component Value Date/Time   NA 137 11/06/2017 0929   K 4.1 11/06/2017 0929   CL 99 11/06/2017 0929   CO2 30 11/06/2017 0929  GLUCOSE 108 (H) 11/06/2017 0929   BUN 8 11/06/2017 0929   BUN 7 08/01/2016   CREATININE 0.74 11/06/2017 0929   CREATININE 0.76 05/15/2013 1042   CALCIUM 9.4 11/06/2017 0929   GFRNONAA >60 11/06/2017 0929   GFRAA >60 11/06/2017 0929   Lab Results  Component Value Date   INR 1.29 07/19/2016   INR 1.41 07/11/2016   INR 1.25 07/06/2016   No results found for: PTT  SOCIAL HISTORY: Social History   Socioeconomic History  . Marital status: Married    Spouse name: etta  . Number of children: 2  . Years of education: Not on file  . Highest education level: Not on file  Occupational History  . Occupation: retired    Fish farm manager: RETIRED    Comment: disabled due to back problems  Social Needs  . Financial resource strain: Not on file  . Food insecurity:    Worry: Not on file    Inability: Not on file  . Transportation needs:    Medical: No    Non-medical: No  Tobacco Use  . Smoking status: Former Smoker    Packs/day: 1.50    Years: 50.00    Pack years: 75.00    Types: Cigarettes    Last attempt to quit: 09/05/2017    Years since quitting: 0.1  . Smokeless tobacco: Never Used  . Tobacco comment: smoked less than 1 ppd for  40+ years; Using Vapor Cig  Substance and Sexual Activity  . Alcohol use: Yes    Alcohol/week: 0.0 standard drinks    Comment: 07/19/2016 "might have a few drinks/year"  . Drug use: No  . Sexual activity: Never  Lifestyle  . Physical activity:    Days per week: Not on file    Minutes per session: Not on file  . Stress: Not on file  Relationships  . Social connections:    Talks on phone: Not on file    Gets together: Not on file    Attends religious service: Not on file    Active member of club or organization: Not on file    Attends meetings of clubs or organizations: Not on file    Relationship status: Not on file  . Intimate partner violence:    Fear of current or ex partner: No    Emotionally abused: No    Physically abused: No    Forced sexual activity: No  Other Topics Concern  . Not on file  Social History Narrative   Married (3rd), [place]. 2 children from 1st marriage, 4 step children.    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       Cell # [id number]    FAMILY HISTORY: Family History  Problem Relation Age of Onset  . Heart disease Father 16       Living  . Coronary artery disease Father        CABG  . Alzheimer's disease Mother 77       Deceased  . Arthritis/Rheumatoid Mother   . Stomach cancer Maternal Uncle   . Brain cancer Maternal Aunt        x2  . Arthritis Brother        DJD  . Obesity Daughter        Had Bypass Sx    REVIEW OF SYSTEMS: Reviewed with the patient as per History of present illness. Psych: Patient denies having  dental phobia.  DENTAL HISTORY: CHIEF COMPLAINT: Patient referred by Dr. Isidore Moos for a dental consultation.  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 Patient with anticipated radiation therapy and possible chemotherapy. Patient is now seen as part of a pre-chemoradiation  therapy dental protocol examination.  The patient indicates that he is edentulous. The patient had his remaining teeth extracted in the early 90's in [place]. Patient had upper and lower dentures made at that time. Patient wore those dentures for 12-13 years. Patient then had a new set of dentures fabricated in [facility] that he wore for approximately 12-13 years. Patient then went to [facility] approximately 5 years ago for a new set of dentures. These never fit well by patient report. Patient then went to [facility] about one year ago for fabrication of a new set of dentures. Patient indicates that these dentures "fit great".  Patient indicates that he does have to use significant amounts of denture adhesive to keep the lower denture in. The patient denies having dental phobia.  DENTAL EXAMINATION: GENERAL:  The patient is a well-developed, well-nourished male in no acute distress. HEAD AND NECK:  There is no palpable neck lymphadenopathy. Patient does have some trismus symptoms associated with maximum opening. This is related to the cancer by patient report. Maximum interincisal opening is measured at 40 mm. INTRAORAL EXAM:  The patient has normal saliva. Patient has a mid palatal torus.There is no evidence of denture irritation or abscess formation.there is significant atrophy of the edentulous alveolar ridges. There are flabby tissues associated with the premaxilla. DENTITION:  Patient is edentulous. PROSTHODONTIC: The patient has an upper and lower complete denture fabricated approximately one year ago. The upper denture is stable and retentive. The lower denture is stable but has less than ideal retention secondary to severe atrophy of the edentulous alveolar ridge. OCCLUSION:  The occlusion of the dentures is acceptable.  RADIOGRAPHIC INTERPRETATION: An orthopantogram was taken today. The patient is edentulous. There is atrophy of the edentulous alveolar ridges. There is no  evidence of impacted teeth or retained root tips.  ASSESSMENTS: 1. Squamous cell carcinoma of the left tonsil  2. Pre-chemoradiation therapy dental protocol examination 3. Patient is edentulous. 4. There is atrophy of the edentulous alveolar ridges 5. Patient has a mid palatal torus 6. The patient has flabby tissues associated with the premaxilla 7. Upper and lower complete dentures that are clinically acceptable.   PLAN/RECOMMENDATIONS: 1. I discussed the risks, benefits, and complications of various treatment options with the patient in relationship to his medical and dental conditions, anticipated radiation therapy and possible chemotherapy, and chemoradiation therapy side effects to include xerostomia, trismus, mucositis, taste changes, gum and jawbone changes, and risk for infection and osteoradionecrosis. We discussed various treatment options to include no treatment, pre-prosthetic surgery as indicated,  implant therapy with fabrication of a new set of dentures. The patient currently refuses any dental treatment at this time.  The patient is aware of the risk for denture irritation associated with the dry mouth and will follow-up with [facility] as needed for denture adjustment. The patient is currently not interested in implant therapy.   2. Discussion of findings with medical team and coordination of future medical and dental care as needed. The patient is now cleared for chemoradiation therapy.    Lenn Cal, DDS

## 2017-11-06 NOTE — Assessment & Plan Note (Addendum)
Per patient, he had a TIA over a decade ago with no residual neurologic deficits.  He denies any history of stroke. He also has a history of coronary artery disease with 100% occluded RCA on LHC. As discussed above, there is no evidence of locally advanced or metastatic disease at this time and chemotherapy is not indicated.  However, I also informed the patient that given his extensive history of cardiovascular disease, he was at high risk for toxicities from systemic therapy, and may be a poor candidate for aggressive cytotoxic chemotherapy if the tonsillar cancer recurs or metastasize in the future.

## 2017-11-06 NOTE — Assessment & Plan Note (Signed)
Patient was diagnosed with strep intermedius bacteremia with liver abscess in June 2018. Percutaneous drain was attempted twice but unable to remain in ideal position and had to be removed. He completed IV ceftriaxone for 3 weeks due to incompletely drained liver abscess, followed by 42-month of p.o. Augmentin. CT abdomen in January 2019 showed resolution of liver abscess. The history of liver abscess and bacteremia has already demonstrated that patient is immunocompromised and at a high risk for infection; if chemotherapy is considered in the future, the risks of potential severe infection will need to be strongly considered before pursuing any systemic therapy.

## 2017-11-06 NOTE — Progress Notes (Signed)
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 - I also independently reviewed the patient's radiologic studies and agreed with the findings as documented. - The patient's case was discussed in detail at the head neck tumor board - As there is a clear fat plane between the pterygoid muscle and the primary site of the disease, and no evidence of LN involvement, this is consistent with cT2N0M0 tumor.  - Pt had been evaluated by Dr. Nicolette [name] in September 2019 at [facility], but he was undecided regarding surgery at that time. - I discussed with the patient at length regarding the rationale for surgery followed by adjuvant radiation or chemoradiation (as indicated) vs. definitive radiation/chemoradiation.   - In my opinion, if he is a surgical candidate, is willing to proceed with surgery, and the benefits of the surgery outweigh the risks, then upfront surgery, followed by adjuvant RT or chemoradiation as indicated, offers the best chance of long-term disease control.  However, if he is not a surgical candidate or morbidity associated with the surgery outweigh the benefits, then definitive radiation is a reasonable option. - In the absence of locally advanced disease or lymph node involvement, there is no indication for concurrent chemotherapy. - Patient has been  evaluated by dental medicine and cleared for RT. - After extensive discussion and consideration, patient expressed interest to be evaluated at [facility] for upfront surgery; meanwhile, I will discuss with Dr. Isidore [name] regarding possible CT simulation in the event that surgery is not an option in order to initiate therapy as quickly as possible  Liver abscess Patient was diagnosed with strep intermedius bacteremia with liver abscess in June 2018. Percutaneous drain was attempted twice but unable to remain in ideal position and had to be removed. He completed IV ceftriaxone for 3 weeks due to incompletely drained liver abscess, followed by 23-month of p.o. Augmentin. CT abdomen in January 2019 showed resolution of liver abscess. The history of liver abscess and bacteremia has already demonstrated that patient is immunocompromised and at a high risk for infection; if chemotherapy is considered in the future, the risks of potential severe infection will need to be strongly considered before pursuing any systemic therapy.   BRAIN STEM STROKE Per patient, he had a TIA over a decade ago with no residual neurologic deficits.  He denies any history of stroke. He also has a history of coronary artery disease with 100% occluded RCA on LHC. As discussed above, there is no evidence of locally advanced or metastatic disease at this time and chemotherapy is not indicated.  However, I also informed the patient that given his extensive history of cardiovascular disease, he was at high risk for toxicities from systemic therapy, and may be a poor candidate for aggressive cytotoxic chemotherapy if the tonsillar cancer recurs or metastasize in the future.    A total of more than 60 minutes were spent face-to-face with the patient during this encounter and over half of that time was spent on counseling  and coordination of care as outlined above.    All questions were answered. The patient knows to call the clinic with  any problems, questions or concerns.  No return to clinic appt needed at this time. Patient will continue follow-up with ENT and radiation oncology as scheduled.   Brandon Men, MD 11/06/2017 3:55 PM   CHIEF COMPLAINTS/PURPOSE OF CONSULTATION:  "I am here for my tonsil cancer"  HISTORY OF PRESENTING ILLNESS:  Brandon Brandon [name] 77 y.o. Brandon [name] is here because of newly diagnosed left tonsillar Brandon [name].  Patient reports that he first experienced some pain in the left ear approximately 4 months ago.  He presented to his PCP and was prescribed p.o. antibiotics with initial improvement, but the left ear pain returned  and he also developed mild pain with jaw opening, prompting to seek further evaluation with ENT.  He reports that the pain in the back of the mouth is currently relatively well controlled with as needed opioid medication (prescribed for chronic back pain), exacerbated by jaw opening and eating.  He reports intermittent night sweats, but denies any fever, chills, weight loss, lymphadenopathy, dysphagia, or odynophagia.  I have reviewed his chart and materials related to his cancer extensively and collaborated history with the patient. Summary of oncologic history is as follows:   Cancer of tonsillar fossa ([place])   07/06/2017 Miscellaneous    Presented to PCP with pain and pressure in the left ear in June 2019; treated with abx with improvement in the pain     09/20/2017 Initial Diagnosis    Cancer of tonsillar fossa ([place])    09/26/2017 Imaging    CT neck:  1. 2.7 cm left tonsil mass most consistent with Brandon [name]. There is extension along the glossotonsillar sulcus and mass is indistinguishable from the superior margin of the submandibular gland. 2. 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    10/23/2017 Imaging    PET (at [facility]; available under  external documents): 1. Ill-defined left tonsillar lesion showing focal tracer uptake (SUV = 4.5) concerning for malignancy. 2. No definite evidence of FDG avid adenopathy or distant metastasis 3. 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    11/05/2017 Cancer Staging    Staging form: Pharynx - HPV-Mediated Oropharynx, AJCC 8th Edition - Clinical: Stage I (cT2, cN0, cM0, p16+) - Signed by Brandon Men, MD on 11/06/2017     MEDICAL HISTORY:  Past Medical History:  Diagnosis Date  . Aortic stenosis    mild echocardiogram 8/09 EF 60%, showed no regional wall motion abnml, mild LVH, mod focal basal septal hypertrophy and mild dyastolic dysfunction. 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 mean gradient across aortic valve was 8 mmHG. also mild L atrial enlargement and normal RV size and function. minimal AS on LHC in 7/10.   . Arthritis    "all over" (07/19/2016)  . BPH (benign prostatic hypertrophy)   . CAD (coronary artery disease)    LCH (7/10) totally occluded proximal RCA with very robuse L to R collaterals, 50% proximal LAD stenosis, EF 65%, medical management.    . Chicken pox   . Chronic lower back pain    s/p surgical fusion  . Diverticulosis   . Esophageal stricture   . GERD (gastroesophageal reflux disease)   . Heart murmur   .  Hepatitis B 1984  . Hiatal hernia   . HLD (hyperlipidemia)   . HTN (hypertension)   . 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 Osteoarthritis   . TIA (transient ischemic attack) 1990s   hx  . Tubular adenoma of colon 2009    SURGICAL HISTORY: Past Surgical History:  Procedure Laterality Date  . BACK SURGERY    . CATARACT EXTRACTION W/ INTRAOCULAR LENS  IMPLANT, BILATERAL Bilateral 01/2012 - 02/2012  . DIRECT LARYNGOSCOPY Left 10/09/2017   Procedure: DIRECT LARYNGOSCOPY WITH BOPSY;  Surgeon: Brandon Marble, MD;  Location: [facility];  Service: ENT;  Laterality: Left;  .  ESOPHAGOGASTRODUODENOSCOPY (EGD) WITH ESOPHAGEAL DILATION     "couple times" (07/19/2016)  . ESOPHAGOSCOPY Left 10/09/2017   Procedure: ESOPHAGOSCOPY;  Surgeon: Brandon Marble, MD;  Location: [facility];  Service: ENT;  Laterality: Left;  . IR THORACENTESIS ASP PLEURAL SPACE W/IMG GUIDE  07/19/2016  . LAPAROSCOPIC CHOLECYSTECTOMY  1994  . LUMBAR DISC SURGERY  05/1996   L4-5; Dr. Sherwood Gambler   . LUMBAR LAMINECTOMY/DECOMPRESSION MICRODISCECTOMY  10/2002   L3-4. Dr. Sherwood Gambler  . MULTIPLE TOOTH EXTRACTIONS  1980s  . PICC LINE INSERTION  07/15/2016  . POSTERIOR LUMBAR FUSION  09/1996   Ray cage, L4-5 Dr. Rita Ohara  . PROSTATE BIOPSY  ~ 2017  . RIGID BRONCHOSCOPY Left 10/09/2017   Procedure: RIGID BRONCHOSCOPY;  Surgeon: Brandon Marble, MD;  Location: [facility];  Service: ENT;  Laterality: Left;    SOCIAL HISTORY: Social History   Socioeconomic History  . Marital status: Married    Spouse name: etta  . Number of children: 2  . Years of education: Not on file  . Highest education level: Not on file  Occupational History  . Occupation: retired    Fish farm manager: RETIRED    Comment: disabled due to back problems  Social Needs  . Financial resource strain: Not on file  . Food insecurity:    Worry: Not on file    Inability: Not on file  . Transportation needs:    Medical: No    Non-medical: No  Tobacco Use  . Smoking status: Former Smoker    Packs/day: 1.50    Years: 50.00    Pack years: 75.00    Types: Cigarettes    Last attempt to quit: 09/05/2017    Years since quitting: 0.1  . Smokeless tobacco: Never Used  . Tobacco comment: smoked less than 1 ppd for 40+ years; Using Vapor Cig  Substance and Sexual Activity  . Alcohol use: Yes    Alcohol/week: 0.0 standard drinks    Comment: 07/19/2016 "might have a few drinks/year"  . Drug use: No  . Sexual activity: Never  Lifestyle  . Physical activity:    Days per week: Not on file    Minutes per session: Not on  file  . Stress: Not on file  Relationships  . Social connections:    Talks on phone: Not on file    Gets together: Not on file    Attends religious service: Not on file    Active member of club or organization: Not on file    Attends meetings of clubs or organizations: Not on file    Relationship status: Not on file  . Intimate partner violence:    Fear of current or ex partner: No    Emotionally abused: No    Physically abused: No    Forced sexual activity: No  Other Topics Concern  . Not on  file  Social History Narrative   Married (3rd), [place]. 2 children from 1st marriage, 4 step children.    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       Cell # [id number]    FAMILY HISTORY: Family History  Problem Relation Age of Onset  . Heart disease Father 72       Living  . Coronary artery disease Father        CABG  . Alzheimer's disease Mother 77       Deceased  . Arthritis/Rheumatoid Mother   . Stomach cancer Maternal Uncle   . Brain cancer Maternal Aunt        x2  . Arthritis Brother        DJD  . Obesity Daughter        Had Bypass Sx    ALLERGIES:  is allergic to celebrex [celecoxib].  MEDICATIONS:  Current Outpatient Medications  Medication Sig Dispense Refill  . amLODipine (NORVASC) 10 MG tablet Take 1 tablet (10 mg total) by mouth daily. 90 tablet 3  . aspirin 81 MG EC tablet Take 81 mg by mouth at bedtime.     . cholecalciferol (VITAMIN D) 1000 UNITS tablet Take 1,000 Units by mouth daily.     . citalopram (CELEXA) 20 MG tablet Take 1/2 tablet (10mg ) PO daily x 14 days. Then increase to 1 tablet (20mg ) daily 30 tablet 3  . clonazePAM (KLONOPIN) 0.5 MG tablet TAKE 1 TABLET(0.5 MG) BY MOUTH TWICE DAILY AS NEEDED FOR ANXIETY 20 tablet 0  . isosorbide mononitrate (IMDUR) 30 MG 24 hr tablet TAKE 1 TABLET(30 MG) BY MOUTH AT BEDTIME 90 tablet 0  . metoprolol tartrate (LOPRESSOR) 25 MG  tablet Take 0.5 tablets (12.5 mg total) by mouth 2 (two) times daily. 30 tablet 5  . Multiple Vitamins-Minerals (MULTIVITAMIN PO) Take 1 tablet by mouth daily.    . Omega-3 Fatty Acids (FISH OIL PO) Take by mouth 2 (two) times daily.    Marland Kitchen oxyCODONE (OXY IR/ROXICODONE) 5 MG immediate release tablet Take 1 tablet (5 mg total) by mouth every 6 (six) hours as needed for moderate pain. 60 tablet 0  . pantoprazole (PROTONIX) 40 MG tablet Take 1 tablet (40 mg total) by mouth daily. 30 tablet 3  . TURMERIC PO Take by mouth.    Marland Kitchen atorvastatin (LIPITOR) 40 MG tablet Take 1 tablet (40 mg total) by mouth daily. 90 tablet 3  . iron polysaccharides (NU-IRON) 150 MG capsule Take 1 capsule (150 mg total) by mouth 2 (two) times daily. (Patient not taking: Reported on 11/06/2017)    . lidocaine (XYLOCAINE) 2 % solution Mix 1part 2% viscous lidocaine, 1part H2O.Swish &/or swallow 68ml of this mixture,64min before meals and at bedtime, up to QID (Patient not taking: Reported on 11/06/2017) 100 mL 5  . lisinopril (PRINIVIL,ZESTRIL) 10 MG tablet Take 1 tablet (10 mg total) by mouth daily. 90 tablet 3   No current facility-administered medications for this visit.     REVIEW OF SYSTEMS:   Constitutional: ( - ) fevers, ( - )  chills  Eyes: ( - ) blurriness of vision, ( - ) double vision, ( - ) watery eyes Ears, nose, mouth, throat, and face: ( - ) mucositis, ( + ) sore throat Respiratory: ( - ) cough, ( - ) dyspnea, ( - ) wheezes Cardiovascular: ( - ) palpitation, ( - ) chest  discomfort, ( - ) lower extremity swelling Gastrointestinal:  ( - ) nausea, ( - ) heartburn, ( - ) change in bowel habits Skin: ( - ) abnormal skin rashes Lymphatics: ( - ) new lymphadenopathy, ( - ) easy bruising Neurological: ( - ) numbness, ( - ) tingling, ( - ) new weaknesses Behavioral/Psych: ( - ) mood change, ( - ) new changes  All other systems were reviewed with the patient and are negative.  PHYSICAL EXAMINATION: ECOG PERFORMANCE  STATUS: 1 - Symptomatic but completely ambulatory  Vitals:   11/06/17 1301  BP: (!) 155/76  Pulse: 68  Resp: 18  Temp: 98.2 F (36.8 C)  SpO2: 98%   Filed Weights   11/06/17 1301  Weight: 173 lb 3.2 oz (78.6 kg)    GENERAL: alert, no distress and comfortable SKIN: skin color, texture, turgor are normal, no rashes or significant lesions EYES: conjunctiva are pink and non-injected, sclera clear OROPHARYNX: mild trismus (3cm mouth opening); no visible tongue lesion seen; moist oral mucosa  NECK: supple, non-tender LYMPH:  no palpable lymphadenopathy in the cervical or axillary region  LUNGS: clear to auscultation and percussion with normal breathing effort HEART: regular rate & rhythm, 2/6 systolic murmur, no lower extremity edema ABDOMEN: soft, non-tender, non-distended, normal bowel sounds Musculoskeletal: no cyanosis of digits and no clubbing  PSYCH: alert & oriented x 3, fluent speech NEURO: no focal motor/sensory deficits  LABORATORY DATA:  I have reviewed the data as listed Lab Results  Component Value Date   WBC 6.6 11/06/2017   HGB 14.3 11/06/2017   HCT 42.3 11/06/2017   MCV 88.9 11/06/2017   PLT 135 (L) 11/06/2017   Lab Results  Component Value Date   NA 137 11/06/2017   K 4.1 11/06/2017   CL 99 11/06/2017   CO2 30 11/06/2017   RADIOGRAPHIC STUDIES: I have personally reviewed the radiological images as listed and agreed with the findings in the report documented in the oncologic history.   PATHOLOGY: I have reviewed the pathology reports as listed in the oncologic history.

## 2017-11-07 ENCOUNTER — Telehealth: Payer: Self-pay | Admitting: *Deleted

## 2017-11-07 ENCOUNTER — Encounter: Payer: Self-pay | Admitting: General Practice

## 2017-11-07 NOTE — Progress Notes (Signed)
Oncology Nurse Navigator Documentation  Met with [name] and his wife during initial consult with Dr. Maylon Peppers.  They voiced understanding of risks/benefits of surgery vs RT w/wo chemotherapy.  They voiced understanding he may not be surgical candidate d/t cardiac and associated histories.  [name] indicated he wished to re-consult with Dr. Nicolette Bang, ENT [facility], to discuss surgical tmt option again. Navigator Interventions  Sent secure email to Dr. Servando Salina Scheduling Coordinator requesting she contact pt to schedule next available with Dr. Nicolette Bang.  Brandon Orem, RN, BSN Head & Neck Oncology Nurse [place] at [place] [phone]

## 2017-11-07 NOTE — Progress Notes (Signed)
[place] Psychosocial Distress Screening Clinical Social Work  Clinical Social Work was referred by distress screening protocol.  The patient scored a 5 on the Psychosocial Distress Thermometer which indicates moderate distress. Clinical Social Worker contacted patient by phone to assess for distress and other psychosocial needs. "I've been running some panic attacks, I've got medication for that."  Trying to make decision re surgery.  "This is a big thing to put on somebody's plate."  Lives w wife is in good health and able to support patient.  Is involved w care of 62 year old father who lives locally, hospice is involved but father is still at home.  Is waiting to hear from surgeon in [facility] before making final decision on treatment plan.  Wants to talk to Financial Advocate re financial stress - has leftover medical bills from medical event last year. Lives on limited social security income.    ONCBCN DISTRESS SCREENING 11/05/2017  Screening Type Initial Screening  Distress experienced in past week (1-10) 5  Emotional problem type Depression;Nervousness/Anxiety;Adjusting to illness;Feeling hopeless  Information Concerns Type Lack of info about diagnosis;Lack of info about treatment;Lack of info about maintaining fitness  Physical Problem type Pain;Sleep/insomnia;Mouth sores/swallowing;Loss of appetitie;Skin dry/itchy    Clinical Social Worker follow up needed: No.  If yes, follow up plan:  Beverely Pace, [place], LCSW Clinical Social Worker Phone:  [phone]

## 2017-11-07 NOTE — Telephone Encounter (Signed)
Oncology Nurse Navigator Documentation  Mr. Hewes called.  He indicated he was strongly in favor of surgery yesterday during/immediately after his appt with Dr. Maylon Peppers but after thinking about it further he is unsure.  I supported his interest in speaking with Dr. Nicolette Bang prior to scheduling an appt, provided him [facility] Dept of Otolaryngology phone number.  Gayleen Orem, RN, BSN Head & Neck Oncology Nurse [place] at [place] [phone]

## 2017-11-07 NOTE — Telephone Encounter (Signed)
Oncology Nurse Navigator Documentation  Sent secure email to Victorino Dike, Scheduling Coordinator for Dr. Nicolette Bang, indicating [name] wishes to reconsult with Dr. Nicolette Bang to discuss TORS, requested pt be contacted with next available appt.  Gayleen Orem, RN, BSN Head & Neck Oncology Nurse [place] at [place] [phone]

## 2017-11-11 ENCOUNTER — Encounter: Payer: Self-pay | Admitting: Physician Assistant

## 2017-11-11 ENCOUNTER — Other Ambulatory Visit: Payer: Self-pay

## 2017-11-11 ENCOUNTER — Ambulatory Visit: Payer: Medicare Other | Admitting: Physician Assistant

## 2017-11-11 ENCOUNTER — Telehealth: Payer: Self-pay | Admitting: *Deleted

## 2017-11-11 VITALS — BP 118/60 | HR 63 | Temp 98.2°F | Resp 14 | Ht 67.0 in | Wt 175.0 lb

## 2017-11-11 DIAGNOSIS — M150 Primary generalized (osteo)arthritis: Secondary | ICD-10-CM | POA: Diagnosis not present

## 2017-11-11 DIAGNOSIS — D099 Carcinoma in situ, unspecified: Secondary | ICD-10-CM | POA: Diagnosis not present

## 2017-11-11 DIAGNOSIS — M8949 Other hypertrophic osteoarthropathy, multiple sites: Secondary | ICD-10-CM

## 2017-11-11 DIAGNOSIS — M159 Polyosteoarthritis, unspecified: Secondary | ICD-10-CM

## 2017-11-11 MED ORDER — OXYCODONE HCL 10 MG PO TABS: 10.0000 mg | ORAL_TABLET | Freq: Four times a day (QID) | ORAL | 0 refills | Status: DC

## 2017-11-11 NOTE — Telephone Encounter (Addendum)
Oncology Nurse Navigator Documentation  Received call from Mr. Yonker.  He stated he had phone conversation with Dr. Nicolette Bang last Thursday afternoon, has decided to move forward with surgery which will be scheduled s/p Dr. Servando Salina receipt of clearance from Mr. [facility] cardiologist, Dr. Aundra Dubin.  He indicated he will contact me with surgical date.  Drs. Erasmo Downer and [place] updated.  Gayleen Orem, RN, BSN Head & Neck Oncology Nurse [place] at [place] [phone]

## 2017-11-11 NOTE — Progress Notes (Signed)
Patient presents to clinic today to discuss oncogenic pain and recent changes in medication regimen made by oncology. Patient with HPV + SCC of left tonsil, recently diagnosed. Patient is followed by ENT, Oncology and radiation oncology. Has had consultations to discuss treatment options and has elected to undergo surgical excision +/- subsequent radiation at [facility]. Pain has been increasing lately and as such it was recommended by his surgeon to increase his Oxycodone to 2 tablets (10 mg) up to every 6-8 hours for moderate to severe pain. He is currently prescribed 5 mg Oxycodone immediate release to take up to every 6-8 hours. As such, he is running out of medication. Notes surgeon will not refill medication until he performs procedure even though he instructed patient to continue medication dosage. Is wanting to know if this is something we can help with until surgery. Patient has [place] on file due to chronic pain medication for his ongoing lumbar issues. Is up-to-date on UDS. Has never failed drug screen, database review or pill count.   Past Medical History:  Diagnosis Date  . Aortic stenosis    mild echocardiogram 8/09 EF 60%, showed no regional wall motion abnml, mild LVH, mod focal basal septal hypertrophy and mild dyastolic dysfunction. 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 mean gradient across aortic valve was 8 mmHG. also mild L atrial enlargement and normal RV size and function. minimal AS on LHC in 7/10.   . Arthritis    "all over" (07/19/2016)  . BPH (benign prostatic hypertrophy)   . CAD (coronary artery disease)    LCH (7/10) totally occluded proximal RCA with very robuse L to R collaterals, 50% proximal LAD stenosis, EF 65%, medical management.    . Chicken pox   . Chronic lower back pain    s/p surgical fusion  . Diverticulosis   . Esophageal stricture   . GERD (gastroesophageal reflux disease)   . Heart murmur   . Hepatitis  B 1984  . Hiatal hernia   . HLD (hyperlipidemia)   . HTN (hypertension)   . 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 Osteoarthritis   . TIA (transient ischemic attack) 1990s   hx  . Tubular adenoma of colon 2009    Current Outpatient Medications on File Prior to Visit  Medication Sig Dispense Refill  . amLODipine (NORVASC) 10 MG tablet Take 1 tablet (10 mg total) by mouth daily. 90 tablet 3  . aspirin 81 MG EC tablet Take 81 mg by mouth at bedtime.     Marland Kitchen atorvastatin (LIPITOR) 40 MG tablet Take 1 tablet (40 mg total) by mouth daily. 90 tablet 3  . cholecalciferol (VITAMIN D) 1000 UNITS tablet Take 1,000 Units by mouth daily.     . citalopram (CELEXA) 20 MG tablet Take 1/2 tablet (93m) PO daily x 14 days. Then increase to 1 tablet (265m daily 30 tablet 3  . clonazePAM (KLONOPIN) 0.5 MG tablet TAKE 1 TABLET(0.5 MG) BY MOUTH TWICE DAILY AS NEEDED FOR ANXIETY 20 tablet 0  . isosorbide mononitrate (IMDUR) 30 MG 24 hr tablet TAKE 1 TABLET(30 MG) BY MOUTH AT BEDTIME 90 tablet 0  . lisinopril (PRINIVIL,ZESTRIL) 10 MG tablet Take 1 tablet (10 mg total) by mouth daily. 90 tablet 3  . metoprolol tartrate (LOPRESSOR) 25 MG tablet Take 0.5 tablets (12.5 mg total) by mouth 2 (two) times daily. 30 tablet 5  . Multiple Vitamins-Minerals (MULTIVITAMIN PO) Take 1 tablet by mouth daily.    .Marland Kitchen  Omega-3 Fatty Acids (FISH OIL PO) Take by mouth 2 (two) times daily.    Marland Kitchen oxyCODONE (OXY IR/ROXICODONE) 5 MG immediate release tablet Take 1 tablet (5 mg total) by mouth every 6 (six) hours as needed for moderate pain. 60 tablet 0  . pantoprazole (PROTONIX) 40 MG tablet Take 1 tablet (40 mg total) by mouth daily. 30 tablet 3  . TURMERIC PO Take by mouth.    . lidocaine (XYLOCAINE) 2 % solution Mix 1part 2% viscous lidocaine, 1part H2O.Swish &/or swallow 9m of this mixture,373m before meals and at bedtime, up to QID (Patient not taking: Reported on 11/06/2017) 100 mL 5   No current facility-administered medications on file prior  to visit.     Allergies  Allergen Reactions  . Celebrex [Celecoxib] Hives and Itching    Family History  Problem Relation Age of Onset  . Heart disease Father [id number]     Living  . Coronary artery disease Father        CABG  . Alzheimer's disease Mother [id number]     Deceased  . Arthritis/Rheumatoid Mother   . Stomach cancer Maternal Uncle   . Brain cancer Maternal Aunt        x2  . Arthritis Brother        DJD  . Obesity Daughter        Had Bypass Sx    Social History   Socioeconomic History  . Marital status: Married    Spouse name: etta  . Number of children: 2  . Years of education: Not on file  . Highest education level: Not on file  Occupational History  . Occupation: retired    EmFish farm managerRETIRED    Comment: disabled due to back problems  Social Needs  . Financial resource strain: Not on file  . Food insecurity:    Worry: Not on file    Inability: Not on file  . Transportation needs:    Medical: No    Non-medical: No  Tobacco Use  . Smoking status: Former Smoker    Packs/day: 1.50    Years: 50.00    Pack years: 75.00    Types: Cigarettes    Last attempt to quit: 09/05/2017    Years since quitting: 0.1  . Smokeless tobacco: Never Used  . Tobacco comment: smoked less than 1 ppd for 40+ years; Using Vapor Cig  Substance and Sexual Activity  . Alcohol use: Yes    Alcohol/week: 0.0 standard drinks    Comment: 07/19/2016 "might have a few drinks/year"  . Drug use: No  . Sexual activity: Never  Lifestyle  . Physical activity:    Days per week: Not on file    Minutes per session: Not on file  . Stress: Not on file  Relationships  . Social connections:    Talks on phone: Not on file    Gets together: Not on file    Attends religious service: Not on file    Active member of club or organization: Not on file    Attends meetings of clubs or organizations: Not on file    Relationship status: Not on file  Other Topics Concern  . Not on file  Social History  Narrative   Married (3rd), [place] children from 1st marriage, 4 step children.    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  Cell # [id number]   Review of Systems - See HPI.  All other ROS are negative.  BP 118/60   Pulse 63   Temp 98.2 F (36.8 C) (Oral)   Resp 14   Ht _0  (1.702 m)   Wt 175 lb (79.4 kg)   SpO2 97%   BMI 27.41 kg/m   Physical Exam  Constitutional: He appears well-developed and well-nourished.  HENT:  Head: Normocephalic and atraumatic.  Cardiovascular: Normal rate, regular rhythm, normal heart sounds and intact distal pulses.  Pulmonary/Chest: Effort normal and breath sounds normal.  Psychiatric: He has a normal mood and affect.  Vitals reviewed.  Recent Results (from the past 2160 hour(s))  Pain Mgmt, Profile 8 w/Conf, U     Status: Abnormal   Collection Time: 09/23/17 11:11 AM  Result Value Ref Range   Creatinine 74.3 > or = 20. mg/dL   pH 6.63 4.5 - 9.0   Oxidant NEGATIVE <200 mcg/mL   Amphetamines NEGATIVE <500 ng/mL   medMATCH Amphetamines CONSISTENT    Benzodiazepines NEGATIVE <100 ng/mL   medMATCH Benzodiazepines CONSISTENT    Marijuana Metabolite NEGATIVE <20 ng/mL   medMATCH Marijuana Metab CONSISTENT    Cocaine Metabolite NEGATIVE <150 ng/mL   medMATCH Cocaine Metab CONSISTENT    Opiates NEGATIVE CONFIRMED <100 ng/mL   Codeine NEGATIVE <50 ng/mL    Comment: See Note 1   medMATCH Codeine CONSISTENT    Hydrocodone NEGATIVE <50 ng/mL    Comment: See Note 1   medMATCH Hydrocodone CONSISTENT    Hydromorphone NEGATIVE <50 ng/mL    Comment: See Note 1   medMATCH Hydromorphone CONSISTENT    Morphine NEGATIVE <50 ng/mL    Comment: See Note 1   medMATCH Morphine CONSISTENT    Norhydrocodone NEGATIVE <50 ng/mL    Comment: See Note 1   medMATCH Norhydrocodone CONSISTENT    Oxycodone POSITIVE (A) <100 ng/mL   Noroxycodone 1,222 (H) <50 ng/mL     Comment: See Note 1   medMATCH Noroxycodone INCONSISTENT     Comment: See Note 2   Oxycodone 1,638 (H) <50 ng/mL    Comment: See Note 1   medMATCH Oxycodone INCONSISTENT    Oxymorphone 1,124 (H) <50 ng/mL    Comment: See Note 1   medMATCH Oxymorphone INCONSISTENT     Comment: See Note 3   Buprenorphine, Urine NEGATIVE <5 ng/mL   medMATCH Buprenorphine CONSISTENT    MDMA NEGATIVE <500 ng/mL   [facility] MDMA CONSISTENT    Alcohol Metabolites POSITIVE (A) <500 ng/mL   Ethyl Glucuronide (ETG) 9,663 (H) <500 ng/mL    Comment: See Note 1   medMATCH ETG INCONSISTENT    Ethyl Sulfate (ETS) 1,356 (H) <100 ng/mL    Comment: See Note 1   medMATCH ETS INCONSISTENT     Comment: Note 1 . This test was developed and its analytical performance  characteristics have been determined by [employer]. It has not been cleared or approved by the FDA. This assay has been validated pursuant to the CLIA  regulations and is used for clinical purposes. . Note 2 Noroxycodone is a metabolite of Oxycodone. . Note 3 Oxymorphone is a metabolite of oxycodone as well as  a prescribed drug.    6 Acetylmorphine NEGATIVE <10 ng/mL   medMATCH 6 Acetylmorphine CONSISTENT     Comment: This drug testing is for medical treatment only.   Analysis was performed as non-forensic testing and  these results should be used only by healthcare  providers to render diagnosis or treatment, or to  monitor progress of medical conditions. Hazel Sams comments are:  - present when drug test results may be the result of     metabolism of one or more drugs or when results are     inconsistent with prescribed medication(s) listed.  - may be blank when drug results are consistent with     prescribed medication(s) listed. . For assistance with interpreting these drug results,  please contact a [employer] Toxicology  Specialist: [phone] [place] [phone]), M-F,  8am-6pm EST. This drug testing is for medical  treatment only.   Analysis was performed as non-forensic testing and  these results should be used only by healthcare  providers to render diagnosis or treatment, or to  monitor progress of medical conditions. Hazel Sams comments are:  - present when  drug test results may be the result of     metabolism of one or more drugs or when results are     inconsistent with prescribed medication(s) listed.  - may be blank when drug results are consistent with     prescribed medication(s) listed. . For assistance with interpreting these drug results,  please contact a [employer] Toxicology  Specialist: [phone] [place] [phone]), M-F,  8am-6pm EST. This drug testing is for medical treatment only.   Analysis was performed as non-forensic testing and  these results should be used only by healthcare  providers to render diagnosis or treatment, or to  monitor progress of medical conditions. Hazel Sams comments are:  - present when drug test results may be the result of     metabolism of one or more drugs or when results are     inconsistent with prescribed medication(s) listed.  - may be blank when drug results are consistent with     prescribed medication(s) listed. . For assistance with interpreting these d rug results,  please contact a [employer] Toxicology  Specialist: [phone] [place] [phone]), M-F,  8am-6pm EST. This drug testing is for medical treatment only.   Analysis was performed as non-forensic testing and  these results should be used only by healthcare  providers to render diagnosis or treatment, or to  monitor progress of medical conditions. Hazel Sams comments are:  - present when drug test results may be the result of     metabolism of one or more drugs or when results are     inconsistent with prescribed medication(s) listed.  - may be blank when drug results are consistent with     prescribed medication(s) listed. . For assistance  with interpreting these drug results,  please contact a [employer] Toxicology  Specialist: [phone] [place] [phone]), M-F,  8am-6pm EST. This drug testing is for medical treatment only.   Analysis was performed as non-forensic testing and  these results should be used only by healthcare  provi ders to render diagnosis or treatment, or to  monitor progress of medical conditions. Hazel Sams comments are:  - present when drug test results may be the result of     metabolism of one or more drugs or when results are     inconsistent with prescribed medication(s) listed.  - may be blank when drug results are consistent with     prescribed medication(s) listed. . For assistance with interpreting these drug results,  please contact a [employer] Toxicology  Specialist: [phone] [place] [phone]), M-F,  8am-6pm EST.   Comprehensive metabolic panel     Status: Abnormal  Collection Time: 10/08/17 10:30 AM  Result Value Ref Range   Sodium 134 (L) 135 - 145 mmol/L   Potassium 3.9 3.5 - 5.1 mmol/L   Chloride 100 98 - 111 mmol/L   CO2 25 22 - 32 mmol/L   Glucose, Bld 167 (H) 70 - 99 mg/dL   BUN 8 8 - 23 mg/dL   Creatinine, Ser 0.75 0.61 - 1.24 mg/dL   Calcium 9.1 8.9 - 10.3 mg/dL   Total Protein 6.5 6.5 - 8.1 g/dL   Albumin 3.6 3.5 - 5.0 g/dL   AST 24 15 - 41 U/L   ALT 32 0 - 44 U/L   Alkaline Phosphatase 116 38 - 126 U/L   Total Bilirubin 1.4 (H) 0.3 - 1.2 mg/dL   GFR calc non Af Amer >60 >60 mL/min   GFR calc Af Amer >60 >60 mL/min    Comment: (NOTE) The eGFR has been calculated using the CKD EPI equation. This calculation has not been validated in all clinical situations. eGFR's persistently <60 mL/min signify possible Chronic Kidney Disease.    Anion gap 9 5 - 15    Comment: Performed at [facility] [address] [place], [place] [address]  Magnesium     Status: None   Collection Time: 11/06/17  9:29 AM  Result Value Ref Range   Magnesium  1.8 1.7 - 2.4 mg/dL    Comment: Performed at [facility], [place] [address], [place], [place] [address]  Comprehensive metabolic panel     Status: Abnormal   Collection Time: 11/06/17  9:29 AM  Result Value Ref Range   Sodium 137 135 - 145 mmol/L   Potassium 4.1 3.5 - 5.1 mmol/L   Chloride 99 98 - 111 mmol/L   CO2 30 22 - 32 mmol/L   Glucose, Bld 108 (H) 70 - 99 mg/dL   BUN 8 8 - 23 mg/dL   Creatinine, Ser 0.74 0.61 - 1.24 mg/dL   Calcium 9.4 8.9 - 10.3 mg/dL   Total Protein 6.8 6.5 - 8.1 g/dL   Albumin 3.8 3.5 - 5.0 g/dL   AST 24 15 - 41 U/L   ALT 31 0 - 44 U/L   Alkaline Phosphatase 136 (H) 38 - 126 U/L   Total Bilirubin 0.8 0.3 - 1.2 mg/dL   GFR calc non Af Amer >60 >60 mL/min   GFR calc Af Amer >60 >60 mL/min    Comment: (NOTE) The eGFR has been calculated using the CKD EPI equation. This calculation has not been validated in all clinical situations. eGFR's persistently <60 mL/min signify possible Chronic Kidney Disease.    Anion gap 8 5 - 15    Comment: Performed at [facility], [address] [place], [place] [address]  T4, free     Status: None   Collection Time: 11/06/17  9:29 AM  Result Value Ref Range   Free T4 1.04 0.82 - 1.77 ng/dL    Comment: (NOTE) Biotin ingestion may interfere with free T4 tests. If the results are inconsistent with the TSH level, previous test results, or the clinical presentation, then consider biotin interference. If needed, order repeat testing after stopping biotin. Performed at [facility], [place] [address] [place], [place] [address]   CBC with Differential/Platelet     Status: Abnormal   Collection Time: 11/06/17  9:29 AM  Result Value Ref Range   WBC 6.6 4.0 - 10.3 K/uL   RBC 4.75  4.20 - 5.82 MIL/uL   Hemoglobin 14.3 13.0 - 17.1 g/dL   HCT 42.3 38.4 - 49.9 %   MCV 88.9 79.3 - 98.0 fL   MCH 30.1 27.2 - 33.4 pg   MCHC 33.8 32.0 - 36.0 g/dL   RDW 13.8 11.0 - 14.6 %    Platelets 135 (L) 140 - 400 K/uL   Neutrophils Relative % 60 %   Neutro Abs 4.0 1.5 - 6.5 K/uL   Lymphocytes Relative 21 %   Lymphs Abs 1.3 0.9 - 3.3 K/uL   Monocytes Relative 7 %   Monocytes Absolute 0.4 0.1 - 0.9 K/uL   Eosinophils Relative 11 %   Eosinophils Absolute 0.7 (H) 0.0 - 0.5 K/uL   Basophils Relative 1 %   Basophils Absolute 0.0 0.0 - 0.1 K/uL    Comment: Performed at [facility], [place] [address], [place], [place] [address]  TSH     Status: None   Collection Time: 11/06/17  9:30 AM  Result Value Ref Range   TSH 0.725 0.320 - 4.118 uIU/mL    Comment: Performed at [facility], [place] [address], [place], [place] [address]    Assessment/Plan: 1. Primary osteoarthritis involving multiple joints 2. Squamous cell carcinoma in situ Both causing increased pain. I feel it is reasonable to increase dose of Oxycodone until his surgical procedure, at which time oncology will take over pain medication. Will increase to 10 mg taken Q6-8 hours. Refill sent. Follow-up discussed.    Leeanne Rio, PA-C

## 2017-11-11 NOTE — Patient Instructions (Addendum)
New prescription sent to pharmacy to help keep pain under control. Follow-up with specialists as scheduled.

## 2017-11-12 ENCOUNTER — Ambulatory Visit: Payer: Medicare Other | Admitting: Physical Therapy

## 2017-11-12 ENCOUNTER — Inpatient Hospital Stay: Payer: Medicare Other | Admitting: Nutrition

## 2017-11-14 ENCOUNTER — Telehealth: Payer: Self-pay | Admitting: *Deleted

## 2017-11-14 NOTE — Telephone Encounter (Signed)
Oncology Nurse Navigator Documentation  Received e-mail from Dr. Servando Sal[place] office, [facility], indicating Brandon [name] surgery is scheduled for 12/02/17.  Drs. Isidore Moos and [employer] informed.  Gayleen Orem, RN, BSN Head & Neck Oncology Nurse [place] at [place] [phone]

## 2017-11-26 DIAGNOSIS — Z87891 Personal history of nicotine dependence: Secondary | ICD-10-CM | POA: Diagnosis not present

## 2017-11-26 DIAGNOSIS — I10 Essential (primary) hypertension: Secondary | ICD-10-CM | POA: Diagnosis not present

## 2017-11-26 DIAGNOSIS — F419 Anxiety disorder, unspecified: Secondary | ICD-10-CM | POA: Diagnosis not present

## 2017-11-26 DIAGNOSIS — C099 Malignant neoplasm of tonsil, unspecified: Secondary | ICD-10-CM | POA: Diagnosis not present

## 2017-11-26 DIAGNOSIS — D72829 Elevated white blood cell count, unspecified: Secondary | ICD-10-CM | POA: Diagnosis not present

## 2017-11-26 DIAGNOSIS — G8929 Other chronic pain: Secondary | ICD-10-CM | POA: Diagnosis not present

## 2017-11-26 DIAGNOSIS — L89322 Pressure ulcer of left buttock, stage 2: Secondary | ICD-10-CM | POA: Diagnosis not present

## 2017-11-26 DIAGNOSIS — C01 Malignant neoplasm of base of tongue: Secondary | ICD-10-CM | POA: Diagnosis not present

## 2017-11-26 DIAGNOSIS — T8183XA Persistent postprocedural fistula, initial encounter: Secondary | ICD-10-CM | POA: Diagnosis not present

## 2017-11-26 DIAGNOSIS — D62 Acute posthemorrhagic anemia: Secondary | ICD-10-CM | POA: Diagnosis not present

## 2017-11-26 DIAGNOSIS — D696 Thrombocytopenia, unspecified: Secondary | ICD-10-CM | POA: Diagnosis not present

## 2017-11-26 DIAGNOSIS — Z01818 Encounter for other preprocedural examination: Secondary | ICD-10-CM | POA: Diagnosis not present

## 2017-11-26 DIAGNOSIS — M545 Low back pain: Secondary | ICD-10-CM | POA: Diagnosis not present

## 2017-11-26 DIAGNOSIS — E119 Type 2 diabetes mellitus without complications: Secondary | ICD-10-CM | POA: Diagnosis not present

## 2017-11-26 DIAGNOSIS — F17290 Nicotine dependence, other tobacco product, uncomplicated: Secondary | ICD-10-CM | POA: Diagnosis not present

## 2017-11-26 DIAGNOSIS — I2510 Atherosclerotic heart disease of native coronary artery without angina pectoris: Secondary | ICD-10-CM | POA: Diagnosis not present

## 2017-11-26 DIAGNOSIS — I714 Abdominal aortic aneurysm, without rupture: Secondary | ICD-10-CM | POA: Diagnosis not present

## 2017-11-26 DIAGNOSIS — L7632 Postprocedural hematoma of skin and subcutaneous tissue following other procedure: Secondary | ICD-10-CM | POA: Diagnosis not present

## 2017-11-27 ENCOUNTER — Telehealth (HOSPITAL_COMMUNITY): Payer: Self-pay

## 2017-11-27 NOTE — Telephone Encounter (Signed)
Surgical clearance faxed  

## 2017-12-02 DIAGNOSIS — F419 Anxiety disorder, unspecified: Secondary | ICD-10-CM | POA: Diagnosis not present

## 2017-12-02 DIAGNOSIS — S1093XA Contusion of unspecified part of neck, initial encounter: Secondary | ICD-10-CM | POA: Diagnosis not present

## 2017-12-02 DIAGNOSIS — L7632 Postprocedural hematoma of skin and subcutaneous tissue following other procedure: Secondary | ICD-10-CM | POA: Diagnosis not present

## 2017-12-02 DIAGNOSIS — T8183XA Persistent postprocedural fistula, initial encounter: Secondary | ICD-10-CM | POA: Diagnosis not present

## 2017-12-02 DIAGNOSIS — I350 Nonrheumatic aortic (valve) stenosis: Secondary | ICD-10-CM | POA: Diagnosis not present

## 2017-12-02 DIAGNOSIS — I2510 Atherosclerotic heart disease of native coronary artery without angina pectoris: Secondary | ICD-10-CM | POA: Diagnosis not present

## 2017-12-02 DIAGNOSIS — C01 Malignant neoplasm of base of tongue: Secondary | ICD-10-CM | POA: Diagnosis not present

## 2017-12-02 DIAGNOSIS — Z4682 Encounter for fitting and adjustment of non-vascular catheter: Secondary | ICD-10-CM | POA: Diagnosis not present

## 2017-12-02 DIAGNOSIS — F17290 Nicotine dependence, other tobacco product, uncomplicated: Secondary | ICD-10-CM | POA: Diagnosis not present

## 2017-12-02 DIAGNOSIS — D62 Acute posthemorrhagic anemia: Secondary | ICD-10-CM | POA: Diagnosis not present

## 2017-12-02 DIAGNOSIS — I714 Abdominal aortic aneurysm, without rupture: Secondary | ICD-10-CM | POA: Diagnosis not present

## 2017-12-02 DIAGNOSIS — L89323 Pressure ulcer of left buttock, stage 3: Secondary | ICD-10-CM | POA: Diagnosis not present

## 2017-12-02 DIAGNOSIS — Z930 Tracheostomy status: Secondary | ICD-10-CM | POA: Diagnosis not present

## 2017-12-02 DIAGNOSIS — I10 Essential (primary) hypertension: Secondary | ICD-10-CM | POA: Diagnosis not present

## 2017-12-02 DIAGNOSIS — R6884 Jaw pain: Secondary | ICD-10-CM | POA: Diagnosis not present

## 2017-12-02 DIAGNOSIS — D119 Benign neoplasm of major salivary gland, unspecified: Secondary | ICD-10-CM | POA: Diagnosis not present

## 2017-12-02 DIAGNOSIS — Z430 Encounter for attention to tracheostomy: Secondary | ICD-10-CM | POA: Diagnosis not present

## 2017-12-02 DIAGNOSIS — D72829 Elevated white blood cell count, unspecified: Secondary | ICD-10-CM | POA: Diagnosis not present

## 2017-12-02 DIAGNOSIS — M545 Low back pain: Secondary | ICD-10-CM | POA: Diagnosis not present

## 2017-12-02 DIAGNOSIS — Z483 Aftercare following surgery for neoplasm: Secondary | ICD-10-CM | POA: Diagnosis not present

## 2017-12-02 DIAGNOSIS — R221 Localized swelling, mass and lump, neck: Secondary | ICD-10-CM | POA: Diagnosis not present

## 2017-12-02 DIAGNOSIS — G8918 Other acute postprocedural pain: Secondary | ICD-10-CM | POA: Diagnosis not present

## 2017-12-02 DIAGNOSIS — C099 Malignant neoplasm of tonsil, unspecified: Secondary | ICD-10-CM | POA: Diagnosis not present

## 2017-12-02 DIAGNOSIS — K91870 Postprocedural hematoma of a digestive system organ or structure following a digestive system procedure: Secondary | ICD-10-CM | POA: Diagnosis not present

## 2017-12-02 DIAGNOSIS — Z4659 Encounter for fitting and adjustment of other gastrointestinal appliance and device: Secondary | ICD-10-CM | POA: Diagnosis not present

## 2017-12-02 DIAGNOSIS — M549 Dorsalgia, unspecified: Secondary | ICD-10-CM | POA: Diagnosis not present

## 2017-12-02 DIAGNOSIS — I351 Nonrheumatic aortic (valve) insufficiency: Secondary | ICD-10-CM | POA: Diagnosis not present

## 2017-12-02 DIAGNOSIS — Z7901 Long term (current) use of anticoagulants: Secondary | ICD-10-CM | POA: Diagnosis not present

## 2017-12-02 DIAGNOSIS — E119 Type 2 diabetes mellitus without complications: Secondary | ICD-10-CM | POA: Diagnosis not present

## 2017-12-02 DIAGNOSIS — Z7982 Long term (current) use of aspirin: Secondary | ICD-10-CM | POA: Diagnosis not present

## 2017-12-02 DIAGNOSIS — R609 Edema, unspecified: Secondary | ICD-10-CM | POA: Diagnosis not present

## 2017-12-02 DIAGNOSIS — L89322 Pressure ulcer of left buttock, stage 2: Secondary | ICD-10-CM | POA: Diagnosis not present

## 2017-12-02 DIAGNOSIS — G8929 Other chronic pain: Secondary | ICD-10-CM | POA: Diagnosis not present

## 2017-12-02 DIAGNOSIS — C090 Malignant neoplasm of tonsillar fossa: Secondary | ICD-10-CM | POA: Diagnosis not present

## 2017-12-02 DIAGNOSIS — D696 Thrombocytopenia, unspecified: Secondary | ICD-10-CM | POA: Diagnosis not present

## 2017-12-02 HISTORY — PX: TRACHEOSTOMY: SUR1362

## 2017-12-02 HISTORY — PX: PARTIAL GLOSSECTOMY: SHX2173

## 2017-12-02 HISTORY — PX: OTHER SURGICAL HISTORY: SHX169

## 2017-12-03 DIAGNOSIS — L7632 Postprocedural hematoma of skin and subcutaneous tissue following other procedure: Secondary | ICD-10-CM | POA: Diagnosis not present

## 2017-12-03 DIAGNOSIS — K91870 Postprocedural hematoma of a digestive system organ or structure following a digestive system procedure: Secondary | ICD-10-CM | POA: Diagnosis not present

## 2017-12-03 DIAGNOSIS — Z930 Tracheostomy status: Secondary | ICD-10-CM | POA: Diagnosis not present

## 2017-12-12 ENCOUNTER — Other Ambulatory Visit (HOSPITAL_COMMUNITY): Payer: Self-pay

## 2017-12-12 ENCOUNTER — Encounter (HOSPITAL_COMMUNITY): Payer: Self-pay

## 2017-12-20 ENCOUNTER — Other Ambulatory Visit: Payer: Self-pay | Admitting: Physician Assistant

## 2017-12-20 DIAGNOSIS — I2510 Atherosclerotic heart disease of native coronary artery without angina pectoris: Secondary | ICD-10-CM | POA: Diagnosis not present

## 2017-12-20 DIAGNOSIS — F419 Anxiety disorder, unspecified: Secondary | ICD-10-CM | POA: Diagnosis not present

## 2017-12-20 DIAGNOSIS — I10 Essential (primary) hypertension: Secondary | ICD-10-CM | POA: Diagnosis not present

## 2017-12-20 DIAGNOSIS — C099 Malignant neoplasm of tonsil, unspecified: Secondary | ICD-10-CM | POA: Diagnosis not present

## 2017-12-20 DIAGNOSIS — C01 Malignant neoplasm of base of tongue: Secondary | ICD-10-CM | POA: Diagnosis not present

## 2017-12-20 DIAGNOSIS — E119 Type 2 diabetes mellitus without complications: Secondary | ICD-10-CM | POA: Diagnosis not present

## 2017-12-20 DIAGNOSIS — M549 Dorsalgia, unspecified: Secondary | ICD-10-CM | POA: Diagnosis not present

## 2017-12-20 DIAGNOSIS — I350 Nonrheumatic aortic (valve) stenosis: Secondary | ICD-10-CM | POA: Diagnosis not present

## 2017-12-20 DIAGNOSIS — Z483 Aftercare following surgery for neoplasm: Secondary | ICD-10-CM | POA: Diagnosis not present

## 2017-12-20 DIAGNOSIS — L89323 Pressure ulcer of left buttock, stage 3: Secondary | ICD-10-CM | POA: Diagnosis not present

## 2017-12-20 DIAGNOSIS — Z430 Encounter for attention to tracheostomy: Secondary | ICD-10-CM | POA: Diagnosis not present

## 2017-12-20 DIAGNOSIS — I351 Nonrheumatic aortic (valve) insufficiency: Secondary | ICD-10-CM | POA: Diagnosis not present

## 2017-12-20 DIAGNOSIS — Z4682 Encounter for fitting and adjustment of non-vascular catheter: Secondary | ICD-10-CM | POA: Diagnosis not present

## 2017-12-20 MED ORDER — OXYCODONE HCL 10 MG PO TABS: 10.0000 mg | ORAL_TABLET | Freq: Four times a day (QID) | ORAL | 0 refills | Status: DC

## 2017-12-20 NOTE — Telephone Encounter (Signed)
Requested medication (s) are due for refill today: yes  Requested medication (s) are on the active medication list: yes  Last refill:  11/11/17  390  0refills  Future visit scheduled: no  Notes to clinic:      Requested Prescriptions  Pending Prescriptions Disp Refills   Oxycodone HCl 10 MG TABS 90 tablet 0    Sig: Take 1 tablet (10 mg total) by mouth every 6 (six) hours.     Not Delegated - Analgesics:  Opioid Agonists Failed - 12/20/2017  2:42 PM      Failed - This refill cannot be delegated      Failed - Urine Drug Screen completed in last 360 days.      Passed - Valid encounter within last 6 months    Recent Outpatient Visits          1 month ago Squamous cell carcinoma in [place], [place], [place]   2 months ago Tonsil cancer [facility])   [place] [facility] [place], [place], [place]   2 months ago Encounter for chronic pain [facility] [place], [place], [place]   3 months ago Pharyngitis, unspecified etiology   Therapist, music [facility] [place], [place]   4 months ago [place] [facility] [place], [place], [place]

## 2017-12-20 NOTE — Telephone Encounter (Signed)
Copied from [place] [phone]. Topic: Quick Communication - Rx Refill/Question >> Dec 20, 2017  1:52 PM Yvette Rack wrote: Medication: Oxycodone HCl 10 MG TABS  Has the patient contacted their pharmacy? no  Preferred Pharmacy (with phone number or street name): [id number] [place], [place] [phone] (Phone) [phone] (Fax)  Agent: Please be advised that RX refills may take up to 3 business days. We ask that you follow-up with your pharmacy.

## 2017-12-20 NOTE — Telephone Encounter (Signed)
Indication for chronic opioid: Chronic pain Medication and dose: Oxycodone 5 mg # pills per month: 90 Last UDS date: 09/23/17 Opioid Treatment Agreement signed (Y/N): Yes Opioid Treatment Agreement last reviewed with patient:   NCCSRS reviewed this encounter (include red flags):

## 2017-12-21 ENCOUNTER — Emergency Department (HOSPITAL_COMMUNITY)
Admission: EM | Admit: 2017-12-21 | Discharge: 2017-12-22 | Disposition: A | Discharge status: Home / Self Care | Payer: Medicare Other | Attending: Emergency Medicine | Admitting: Emergency Medicine

## 2017-12-21 ENCOUNTER — Other Ambulatory Visit: Payer: Self-pay

## 2017-12-21 ENCOUNTER — Encounter (HOSPITAL_COMMUNITY): Payer: Self-pay | Admitting: Emergency Medicine

## 2017-12-21 DIAGNOSIS — Z7982 Long term (current) use of aspirin: Secondary | ICD-10-CM | POA: Diagnosis not present

## 2017-12-21 DIAGNOSIS — Z87891 Personal history of nicotine dependence: Secondary | ICD-10-CM | POA: Diagnosis not present

## 2017-12-21 DIAGNOSIS — X58XXXA Exposure to other specified factors, initial encounter: Secondary | ICD-10-CM | POA: Insufficient documentation

## 2017-12-21 DIAGNOSIS — Z930 Tracheostomy status: Secondary | ICD-10-CM | POA: Diagnosis not present

## 2017-12-21 DIAGNOSIS — Y999 Unspecified external cause status: Secondary | ICD-10-CM | POA: Insufficient documentation

## 2017-12-21 DIAGNOSIS — Z79899 Other long term (current) drug therapy: Secondary | ICD-10-CM | POA: Insufficient documentation

## 2017-12-21 DIAGNOSIS — S1190XA Unspecified open wound of unspecified part of neck, initial encounter: Secondary | ICD-10-CM | POA: Diagnosis not present

## 2017-12-21 DIAGNOSIS — R600 Localized edema: Secondary | ICD-10-CM | POA: Diagnosis present

## 2017-12-21 DIAGNOSIS — Y929 Unspecified place or not applicable: Secondary | ICD-10-CM | POA: Diagnosis not present

## 2017-12-21 DIAGNOSIS — I2510 Atherosclerotic heart disease of native coronary artery without angina pectoris: Secondary | ICD-10-CM | POA: Insufficient documentation

## 2017-12-21 DIAGNOSIS — R000 Tachycardia, unspecified: Secondary | ICD-10-CM | POA: Diagnosis not present

## 2017-12-21 DIAGNOSIS — Y939 Activity, unspecified: Secondary | ICD-10-CM | POA: Diagnosis not present

## 2017-12-21 DIAGNOSIS — I10 Essential (primary) hypertension: Secondary | ICD-10-CM | POA: Diagnosis not present

## 2017-12-21 DIAGNOSIS — C090 Malignant neoplasm of tonsillar fossa: Secondary | ICD-10-CM | POA: Diagnosis not present

## 2017-12-21 DIAGNOSIS — Z8673 Personal history of transient ischemic attack (TIA), and cerebral infarction without residual deficits: Secondary | ICD-10-CM | POA: Insufficient documentation

## 2017-12-21 HISTORY — DX: Malignant (primary) neoplasm, unspecified: C80.1

## 2017-12-21 NOTE — ED Triage Notes (Signed)
Pt reports having swelling after being discharged from [facility] for tonsil cancer surgery yesterday. Pt has a tracheostomy and is requiring frequent suctioning and cough. Pt has obvious swelling to left lower jaw and reports having pain radiate to left arm.

## 2017-12-22 ENCOUNTER — Emergency Department (HOSPITAL_COMMUNITY): Payer: Medicare Other

## 2017-12-22 ENCOUNTER — Encounter (HOSPITAL_COMMUNITY): Payer: Self-pay | Admitting: Radiology

## 2017-12-22 DIAGNOSIS — C090 Malignant neoplasm of tonsillar fossa: Secondary | ICD-10-CM | POA: Diagnosis not present

## 2017-12-22 LAB — CBC WITH DIFFERENTIAL/PLATELET
Abs Immature Granulocytes: 0.09 10*3/uL — ABNORMAL HIGH (ref 0.00–0.07)
BASOS PCT: 0 %
Basophils Absolute: 0 10*3/uL (ref 0.0–0.1)
EOS ABS: 0.4 10*3/uL (ref 0.0–0.5)
EOS PCT: 5 %
HEMATOCRIT: 33.1 % — AB (ref 39.0–52.0)
Hemoglobin: 10.3 g/dL — ABNORMAL LOW (ref 13.0–17.0)
Immature Granulocytes: 1 %
LYMPHS ABS: 1 10*3/uL (ref 0.7–4.0)
Lymphocytes Relative: 12 %
MCH: 29.3 pg (ref 26.0–34.0)
MCHC: 31.1 g/dL (ref 30.0–36.0)
MCV: 94 fL (ref 80.0–100.0)
MONO ABS: 0.7 10*3/uL (ref 0.1–1.0)
MONOS PCT: 9 %
Neutro Abs: 6.3 10*3/uL (ref 1.7–7.7)
Neutrophils Relative %: 73 %
Platelets: 358 10*3/uL (ref 150–400)
RBC: 3.52 MIL/uL — ABNORMAL LOW (ref 4.22–5.81)
RDW: 13.7 % (ref 11.5–15.5)
WBC: 8.6 10*3/uL (ref 4.0–10.5)
nRBC: 0 % (ref 0.0–0.2)

## 2017-12-22 LAB — I-STAT CG4 LACTIC ACID, ED: Lactic Acid, Venous: 0.93 mmol/L (ref 0.5–1.9)

## 2017-12-22 LAB — COMPREHENSIVE METABOLIC PANEL
ALBUMIN: 3.4 g/dL — AB (ref 3.5–5.0)
ALK PHOS: 201 U/L — AB (ref 38–126)
ALT: 38 U/L (ref 0–44)
AST: 22 U/L (ref 15–41)
Anion gap: 11 (ref 5–15)
BILIRUBIN TOTAL: 0.9 mg/dL (ref 0.3–1.2)
BUN: 24 mg/dL — AB (ref 8–23)
CALCIUM: 9.1 mg/dL (ref 8.9–10.3)
CO2: 26 mmol/L (ref 22–32)
CREATININE: 0.77 mg/dL (ref 0.61–1.24)
Chloride: 101 mmol/L (ref 98–111)
GFR calc Af Amer: >60 mL/min (ref >60)
GFR calc non Af Amer: >60 mL/min (ref >60)
GLUCOSE: 132 mg/dL — AB (ref 70–99)
Potassium: 4.1 mmol/L (ref 3.5–5.1)
SODIUM: 138 mmol/L (ref 135–145)
TOTAL PROTEIN: 6.7 g/dL (ref 6.5–8.1)

## 2017-12-22 MED ORDER — SODIUM CHLORIDE 0.9 % IV BOLUS
1000.0000 mL | Freq: Once | INTRAVENOUS | Status: AC
  Administered 2017-12-22: 1000 mL via INTRAVENOUS

## 2017-12-22 MED ORDER — IOHEXOL 300 MG/ML  SOLN
75.0000 mL | Freq: Once | INTRAMUSCULAR | Status: AC | PRN
  Administered 2017-12-22: 75 mL via INTRAVENOUS

## 2017-12-22 MED ORDER — FENTANYL CITRATE (PF) 100 MCG/2ML IJ SOLN
50.0000 ug | Freq: Once | INTRAMUSCULAR | Status: AC
  Administered 2017-12-22: 50 ug via INTRAVENOUS
  Filled 2017-12-22: qty 2

## 2017-12-22 MED ORDER — SODIUM CHLORIDE (PF) 0.9 % IJ SOLN
INTRAMUSCULAR | Status: AC
  Filled 2017-12-22: qty 50

## 2017-12-22 NOTE — ED Notes (Signed)
Provider notified of pt's elevated HR (114) upon DC.  Provider to bedside and discussed DC with patient.  Pt states he wants to go home.  Provider OK's patient to go home.  Pt educated on seeking medical attention if he develops a fever or worsening condition.

## 2017-12-22 NOTE — ED Notes (Signed)
Pt has f/u appointment this week with surgeon.

## 2017-12-22 NOTE — ED Provider Notes (Signed)
[facility] DEPT Provider Note  CSN: [id number] Arrival date & time: 12/21/17 2308  Chief Complaint(s) Facial Swelling  HPI Brandon [name] is a 77 y.o. 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  Patient reports having an open wound to the left neck which is being treated with wet-to-dry dressings.  Patient has been doing well until earlier today when he noted swelling with associated mild neck pain.  He denies any associated fevers or chills.  Endorses baseline sputum production.  No chest pain or shortness of breath.  No nausea or vomiting.  No abdominal pain.  No headache or nuchal rigidity.  HPI     Past Medical History Past Medical History:  Diagnosis Date  . Aortic stenosis    mild echocardiogram 8/09 EF 60%, showed no regional wall motion abnml, mild LVH, mod focal basal septal hypertrophy and mild dyastolic dysfunction. 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 mean gradient across aortic valve was 8 mmHG. also mild L atrial enlargement and normal RV size and function. minimal AS on LHC in 7/10.   . Arthritis    "all over" (07/19/2016)  . BPH (benign prostatic hypertrophy)   . CAD (coronary artery disease)    LCH (7/10) totally occluded proximal RCA with very robuse L to R collaterals, 50% proximal LAD stenosis, EF 65%, medical management.    . Chicken pox   . Chronic lower back pain    s/p surgical fusion  . Diverticulosis   . Esophageal stricture   . GERD (gastroesophageal reflux disease)   . Heart murmur   . Hepatitis B 1984  . Hiatal hernia   . HLD (hyperlipidemia)   . HTN (hypertension)   . 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 Osteoarthritis   . TIA (transient ischemic attack) 1990s   hx  . tonsillar ca dx'd 11/2017  . Tubular adenoma of colon 2009   Patient Active  Problem List   Diagnosis Date Noted  . Cancer of tonsillar fossa ([place]) 09/20/2017  . Bruising 10/02/2016  . 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 Aneurysm of infrarenal abdominal aorta (HCC) 07/10/2016  . Normocytic anemia 07/10/2016  . Tobacco abuse disorder 01/10/2014  . Seasonal allergies 11/23/2013  . Essential hypertension 05/19/2013  . Osteoarthritis 05/29/2009  . 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 Aortic valve disorder 01/28/2008  . BRAIN STEM STROKE 08/27/2007  . 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 BENIGN PROSTATIC HYPERTROPHY, HX OF 08/27/2007  . Hyperlipidemia 03/19/2007  . Anxiety state 03/19/2007  . GERD 03/19/2007   Home Medication(s) Prior to Admission medications   Medication Sig Start Date End Date Taking? Authorizing Provider  amLODipine (NORVASC) 10 MG tablet Take 1 tablet (10 mg total) by mouth daily. 07/12/17   Larey Dresser, MD  aspirin 81 MG EC tablet Take 81 mg by mouth at bedtime.     [provider]  atorvastatin (LIPITOR) 40 MG tablet Take 1 tablet (40 mg total) by mouth daily. 05/31/17 11/11/17  Larey Dresser, MD  cholecalciferol (VITAMIN D) 1000 UNITS tablet Take 1,000 Units by mouth daily.     [provider]  citalopram (CELEXA) 20 MG tablet Take 1/2 tablet (10mg ) PO daily x 14 days. Then increase to 1 tablet (20mg ) daily 10/18/17   Brunetta Jeans, PA-C  clonazePAM (KLONOPIN) 0.5 MG tablet TAKE 1 TABLET(0.5 MG) BY  MOUTH TWICE DAILY AS NEEDED FOR ANXIETY 10/14/17   Brunetta Jeans, PA-C  isosorbide mononitrate (IMDUR) 30 MG 24 hr tablet TAKE 1 TABLET(30 MG) BY MOUTH AT BEDTIME 10/25/17   Brunetta Jeans, PA-C  lidocaine (XYLOCAINE) 2 % solution Mix 1part 2% viscous lidocaine, 1part H2O.Swish &/or swallow 75ml of this mixture,57min before meals and at bedtime, up to QID Patient not taking: Reported on 11/06/2017 11/05/17   Eppie Gibson, MD  lisinopril (PRINIVIL,ZESTRIL) 10 MG tablet Take 1 tablet (10 mg total) by mouth daily. 05/24/17 11/11/17  Larey Dresser, MD  metoprolol tartrate (LOPRESSOR) 25 MG tablet Take 0.5 tablets (12.5 mg total) by mouth 2 (two) times daily. 10/23/17   Larey Dresser, MD  Multiple Vitamins-Minerals (MULTIVITAMIN PO) Take 1 tablet by mouth daily.    [provider]  Omega-3 Fatty Acids (FISH OIL PO) Take by mouth 2 (two) times daily.    [provider]  Oxycodone HCl 10 MG TABS Take 1 tablet (10 mg total) by mouth every 6 (six) hours. 12/20/17   Brunetta Jeans, PA-C  pantoprazole (PROTONIX) 40 MG tablet Take 1 tablet (40 mg total) by mouth daily. 10/18/17   Brunetta Jeans, PA-C  TURMERIC PO Take by mouth.    [provider]                                                                                                                                    Past Surgical History Past Surgical History:  Procedure Laterality Date  . BACK SURGERY    . CATARACT EXTRACTION W/ INTRAOCULAR LENS  IMPLANT, BILATERAL Bilateral 01/2012 - 02/2012  . DIRECT LARYNGOSCOPY Left 10/09/2017   Procedure: DIRECT LARYNGOSCOPY WITH BOPSY;  Surgeon: Jodi Marble, MD;  Location: [facility];  Service: ENT;  Laterality: Left;  . ESOPHAGOGASTRODUODENOSCOPY (EGD) WITH ESOPHAGEAL DILATION     "couple times" (07/19/2016)  . ESOPHAGOSCOPY Left 10/09/2017   Procedure: ESOPHAGOSCOPY;  Surgeon: Jodi Marble, MD;  Location: [facility];  Service: ENT;  Laterality: Left;  . IR THORACENTESIS ASP PLEURAL SPACE W/IMG GUIDE  07/19/2016  . LAPAROSCOPIC CHOLECYSTECTOMY  1994  . LUMBAR DISC SURGERY  05/1996   L4-5; Dr. Sherwood Gambler   . LUMBAR LAMINECTOMY/DECOMPRESSION MICRODISCECTOMY  10/2002   L3-4. Dr. Sherwood Gambler  . MULTIPLE TOOTH EXTRACTIONS  1980s  . PICC LINE INSERTION  07/15/2016  . POSTERIOR LUMBAR FUSION  09/1996   Ray cage, L4-5 Dr. Rita Ohara  . PROSTATE BIOPSY  ~ 2017  . RIGID BRONCHOSCOPY Left 10/09/2017   Procedure: RIGID BRONCHOSCOPY;  Surgeon: Jodi Marble, MD;  Location: [facility];  Service: ENT;  Laterality: Left;   Family History Family History  Problem Relation Age of Onset  . Heart disease Father 59       Living  . Coronary artery disease Father        CABG  .  Alzheimer's disease Mother 23       Deceased  . Arthritis/Rheumatoid Mother   . Stomach cancer Maternal Uncle   . Brain cancer Maternal Aunt        x2  . Arthritis Brother        DJD  . Obesity Daughter        Had Bypass Sx    Social History Social History   Tobacco Use  . Smoking status: Former Smoker    Packs/day: 1.50    Years: 50.00    Pack years: 75.00    Types: Cigarettes    Last attempt to quit: 09/05/2017    Years since quitting: 0.2  . Smokeless tobacco: Never Used  . Tobacco comment: smoked less than 1 ppd for 40+ years; Using Vapor Cig  Substance Use Topics  . Alcohol use: Yes    Alcohol/week: 0.0 standard drinks    Comment: 07/19/2016 "might have a few drinks/year"  . Drug use: No   Allergies Celebrex [celecoxib]  Review of Systems Review of Systems All other systems are reviewed and are negative for acute change except as noted in the HPI  Physical Exam Vital Signs  I have reviewed the triage vital signs BP 117/71   Pulse (!) 114   Temp 98.2 F (36.8 C) (Oral)   Resp (!) 22   Ht 5\' 9"  (1.753 m)   Wt 72.6 kg   SpO2 96%   BMI 23.63 kg/m   Physical Exam  Constitutional: He is oriented to person, place, and time. He appears well-developed and well-nourished. No distress.  HENT:  Head: Normocephalic and atraumatic.  Nose: Nose normal.  Eyes: Pupils are equal, round, and reactive to light. Conjunctivae and EOM are normal. Right eye exhibits no discharge. Left eye exhibits no discharge. No scleral icterus.  Neck: Normal range of motion. Neck supple. Muscular tenderness present.    Cardiovascular: Normal rate and regular rhythm. Exam reveals no gallop and no friction rub.  No murmur heard. Pulmonary/Chest: Effort normal and breath sounds normal.  No stridor. No respiratory distress. He has no rales.  Abdominal: Soft. He exhibits no distension. There is no tenderness.  Musculoskeletal: He exhibits no edema or tenderness.  Neurological: He is alert and oriented to person, place, and time.  Skin: Skin is warm and dry. No rash noted. He is not diaphoretic. No erythema.  Psychiatric: He has a normal mood and affect.  Vitals reviewed.   ED Results and Treatments Labs (all labs ordered are listed, but only abnormal results are displayed) Labs Reviewed  COMPREHENSIVE METABOLIC PANEL - Abnormal; Notable for the following components:      Result Value   Glucose, Bld 132 (*)    BUN 24 (*)    Albumin 3.4 (*)    Alkaline Phosphatase 201 (*)    All other components within normal limits  CBC WITH DIFFERENTIAL/PLATELET - Abnormal; Notable for the following components:   RBC 3.52 (*)    Hemoglobin 10.3 (*)    HCT 33.1 (*)    Abs Immature Granulocytes 0.09 (*)    All other components within normal limits  I-STAT CG4 LACTIC ACID, ED  I-STAT CG4 LACTIC ACID, ED  EKG  EKG Interpretation  Date/Time:  Sunday December 22 2017 00:28:30 EST Ventricular Rate:  100 PR Interval:    QRS Duration: 96 QT Interval:  349 QTC Calculation: 451 R Axis:   42 Text Interpretation:  Sinus tachycardia Probable inferior infarct, age indeterminate Otherwise no significant change Confirmed by Addison Lank [phone]) on 12/22/2017 1:51:37 AM      Radiology Ct Soft Tissue Neck W Contrast  Result Date: 12/22/2017 CLINICAL DATA:  Tonsillar cancer with recent resection. EXAM: CT NECK WITH CONTRAST TECHNIQUE: Multidetector CT imaging of the neck was performed using the standard protocol following the bolus administration of intravenous contrast. CONTRAST:  81mL OMNIPAQUE IOHEXOL 300 MG/ML  SOLN COMPARISON:  CT neck 09/26/2017 FINDINGS: PHARYNX  AND LARYNX: --Nasopharynx: Fossae of Rosenmuller are clear. Normal adenoid tonsils for age. --Oral cavity and oropharynx: Status post resection of left palatine tonsillar mass. The tongue is deviated to the right. There is soft tissue thickening surrounding the resection site and extending into the left parotid space, left submandibular space and left retromolar trigone. No abscess or fluid collection. --Hypopharynx: Normal vallecula and pyriform sinuses. --Larynx: Normal epiglottis and pre-epiglottic space. Normal aryepiglottic and vocal folds. Status post tracheostomy. --Retropharyngeal space: No abscess, effusion or lymphadenopathy. SALIVARY GLANDS: --Parotid: Normal right parotid gland. The left parotid gland itself is normal. There is soft tissue thickening within the posterior left parotid space. --Submandibular: Soft tissue thickening surrounding the left submandibular gland. Right submandibular gland is normal. --Sublingual: Normal THYROID: Normal. LYMPH NODES: Status post left neck dissection. There is postsurgical change including areas of soft tissue thickening. No discrete residual lymph nodes. No right-sided lymphadenopathy. VASCULAR: There is bilateral carotid bifurcation atherosclerosis but the internal carotid arteries remain patent. 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 LIMITED INTRACRANIAL: Normal. VISUALIZED ORBITS: Normal. MASTOIDS AND VISUALIZED PARANASAL SINUSES: No fluid levels or advanced mucosal thickening. No mastoid effusion. SKELETON: No bony spinal canal stenosis. No lytic or blastic lesions. UPPER CHEST: There is an open wound of the anterior left supraclavicular fossa with underlying vascular clips and soft tissue thickening. No abscess or fluid collection. OTHER: None. IMPRESSION: 1. Status post resection of left palatine tonsillar mass with surrounding soft tissue thickening that may be postsurgical. No abscess or fluid collection. Attention on follow-up is warranted to exclude  recurrent or residual tumor. 2. Status post left neck dissection with open wound of the anterior left supraclavicular fossa. No abscess or fluid collection. 3. Status post tracheostomy. Electronically Signed   By: Ulyses Jarred M.D.   On: 12/22/2017 03:03   Pertinent labs & imaging results that were available during my care of the patient were reviewed by me and considered in my medical decision making (see chart for details).  Medications Ordered in ED Medications  sodium chloride 0.9 % bolus 1,000 mL (0 mLs Intravenous Stopped 12/22/17 0547)  fentaNYL (SUBLIMAZE) injection 50 mcg (50 mcg Intravenous Given 12/22/17 0054)  iohexol (OMNIPAQUE) 300 MG/ML solution 75 mL (75 mLs Intravenous Contrast Given 12/22/17 0231)  Procedures Procedures  (including critical care time)  Medical Decision Making / ED Course I have reviewed the nursing notes for this encounter and the patient's prior records (if available in EHR or on provided paperwork).    Afebrile with stable vital signs.  No significant distress at this time.  Work-up reassuring without leukocytosis.  CT without infectious process.  Patient already has close follow-up with otolaryngologist this upcoming week.  The patient appears reasonably screened and/or stabilized for discharge and I doubt any other medical condition or other [facility] requiring further screening, evaluation, or treatment in the ED at this time prior to discharge.  The patient is safe for discharge with strict return precautions.     Final Clinical Impression(s) / ED Diagnoses Final diagnoses:  Open wound of neck, initial encounter    Disposition: Discharge  Condition: Good  I have discussed the results, Dx and Tx plan with the patient who expressed understanding and agree(s) with the plan. Discharge instructions discussed at great  length. The patient was given strict return precautions who verbalized understanding of the instructions. No further questions at time of discharge.    ED Discharge Orders    None       Follow Up: Otolaryngologist  Go on 12/25/2017 As scheduled     This chart was dictated using voice recognition software.  Despite best efforts to proofread,  errors can occur which can change the documentation meaning.   Fatima Blank, MD 12/22/17 [phone]

## 2017-12-24 ENCOUNTER — Telehealth: Payer: Self-pay | Admitting: *Deleted

## 2017-12-24 DIAGNOSIS — C108 Malignant neoplasm of overlapping sites of oropharynx: Secondary | ICD-10-CM | POA: Diagnosis not present

## 2017-12-24 DIAGNOSIS — Z483 Aftercare following surgery for neoplasm: Secondary | ICD-10-CM | POA: Diagnosis not present

## 2017-12-24 DIAGNOSIS — Z930 Tracheostomy status: Secondary | ICD-10-CM | POA: Diagnosis not present

## 2017-12-24 NOTE — Telephone Encounter (Signed)
Oncology Nurse Navigator Documentation  Spoke with Mrs Roepke, informed her of 11/26 0930 NE/1000 F/U New with Dr. Isidore Moos with possibility of CT [facility] following.  I encouraged 0915 arrival for registration.  She voiced understanding.  Gayleen Orem, RN, BSN Head & Neck Oncology Nurse [place] at [place] [phone]

## 2017-12-25 ENCOUNTER — Telehealth: Payer: Self-pay | Admitting: *Deleted

## 2017-12-25 NOTE — Telephone Encounter (Signed)
Oncology Nurse Navigator Documentation  Spoke with pt wife, informed her of 11/26 11:00 [facility] appt following 10:00 appt with Dr. Isidore Moos.  She voiced understanding.  Gayleen Orem, RN, BSN Head & Neck Oncology Nurse [place] at [place] [phone]

## 2017-12-26 ENCOUNTER — Encounter: Payer: Self-pay | Admitting: Radiation Oncology

## 2017-12-26 NOTE — Progress Notes (Signed)
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 "LEFT NECK CONTENTS LEVELS 2A, 3, 4, AND 5": Twenty seven lymph nodes negative for metastatic carcinoma (0/27). B. "LEFT TONGUE BASE, TONSIL AND SUBMANDIBULAR GLAND": Invasive p16 positive squamous cell carcinoma. Size: 4.0 cm in greatest dimension, 3.3 cm maximum depth of invasion. Anterior soft tissue margin is positive for carcinoma, all other margins are negative, but close, see summary. Perineural invasion present. Lymphovascular invasion present. Tumor extends to involve submandibular gland. See comment and cancer protocol. C. "LEFT NECK CONTENTS LEVEL 2B": Five lymph nodes negative for metastatic carcinoma (0/5).  Patient presented  months ago with symptoms of:  Dr. Noreene Filbert original consult note from 09/20/17: Beginning roughly 2 months ago, he has noticed pain and pressure in his left ear. His primary physician thought there was fluid and gave him amoxicillin which did not help. He began developing pressure in his left upper neck and received another course of amoxicillin which did not help. He now has some throat pain, ear pain, and pain when he opens his mouth, moves his tongue, or swallows. He thinks he can feel some bumps in his left ear canal. He is on chronic oxycodone for his pain. He has not really appreciated a neck mass.   Biopsies of left tonsil revealed: squamous cell carcinoma  Nutrition Status Yes No Comments  Weight changes? [x]  []  He is losing weight.   Swallowing concerns? [x]  []  He is taking sips of water and pepsi.   PEG? [x]  []  He currently has a feeding tube through his nose.    Referrals Yes No Comments  Social Work? []  [x]    Dentistry? []  [x]     Swallowing therapy? [x]  []  He is [facility] at [facility].   Nutrition? []  [x]    Med/Onc? [x]  []  11/06/17 Dr. Maylon Peppers, no future apt scheduled.    Safety Issues Yes No Comments  Prior radiation? []  [x]    Pacemaker/ICD? []  [x]    Possible current pregnancy? []  [x]    Is the patient on methotrexate? []  [x]     Tobacco/Marijuana/Snuff/ETOH use: He is not drinking alcohol. He quit smoking cigarettes about one month ago.  Past/Anticipated interventions by otolaryngology, if any:  10/09/17 Proc:Direct laryngoscopy with biopsy, esophagoscopy, bronchoscopy Surg: Jodi Marble T MD  24/40/10 Dr. Nicolette Bang  transcervical / transhyoid resection with left neck dissection on 12/02/17.  12/24/17 Dr. 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 He is s/p transcervical / transhyoid resection with left neck dissection on 12/02/17. Pathology with a 4.0 cm tumor, 3.3 cm depth of invasion, positive margin, +PNI, +LVI, 0/32 nodes.  He appears to be healing appropriately  Functional recovery has been slow but he is steadily improving. Will be seeing [name] next week. Will keep the trach until we know if he will need a gtube or not. We discussed his case in tumor board and recommended adjuvant radiotherapy. He has seen Dr Isidore Moos preoperatively and we can get him back with her.  Wound care measures were taught to the patient.  I will see him back in 3 weeks.Marland Kitchen  He is agreeable with this plan. All his questions were answered.     Past/Anticipated interventions by medical oncology, if any:  11/06/17 Dr. Maylon Peppers In my opinion, if he is a surgical candidate, is  willing to proceed with surgery, and the benefits of the surgery outweigh the risks, then upfront surgery, followed by adjuvant RT or chemoradiation as indicated, offers the best chance of long-term disease control.  However, if he is not a surgical  candidate or morbidity associated with the surgery outweigh the benefits, then definitive radiation is a reasonable option. - In the absence of locally advanced disease or lymph node involvement, there is no indication for concurrent chemotherapy. - Patient has been evaluated by dental medicine and cleared for RT. - After extensive discussion and consideration, patient expressed interest to be evaluated at [facility] for upfront surgery; meanwhile, I will discuss with Dr. Isidore Moos regarding possible CT simulation in the event that surgery is not an option in order to initiate therapy as quickly as possible   Current Complaints / other details:  Patient currently has a feeding tube in his nose. He saw ST at Dr. Servando Salina office yesterday. Mr. Parker reports to me that they are discussing placing a PEG tube and removing.  He reports thick sputum. He is frequently suctioning himself. He is concerned about laying flat today for simulation.   BP (!) 123/98 (BP Location: Left Arm, Patient Position: Sitting)   Pulse (!) 112   Temp 97.9 F (36.6 C)   Resp 20   Ht 5\' 7"  (1.702 m)   Wt 159 lb 9.6 oz (72.4 kg)   SpO2 96%   BMI 25.00 kg/m    Wt Readings from Last 3 Encounters:  12/31/17 159 lb 9.6 oz (72.4 kg)  12/27/17 159 lb 8 oz (72.3 kg)  12/21/17 160 lb (72.6 kg)

## 2017-12-27 ENCOUNTER — Encounter: Payer: Self-pay | Admitting: Physician Assistant

## 2017-12-27 ENCOUNTER — Other Ambulatory Visit: Payer: Self-pay

## 2017-12-27 ENCOUNTER — Ambulatory Visit: Payer: Medicare Other | Admitting: Physician Assistant

## 2017-12-27 VITALS — BP 108/62 | HR 68 | Resp 17 | Ht 67.0 in | Wt 159.5 lb

## 2017-12-27 DIAGNOSIS — C099 Malignant neoplasm of tonsil, unspecified: Secondary | ICD-10-CM | POA: Diagnosis not present

## 2017-12-27 DIAGNOSIS — M62838 Other muscle spasm: Secondary | ICD-10-CM | POA: Diagnosis not present

## 2017-12-27 MED ORDER — METHOCARBAMOL 500 MG PO TABS: 500.0000 mg | ORAL_TABLET | Freq: Four times a day (QID) | ORAL | 0 refills | Status: DC

## 2017-12-27 NOTE — Patient Instructions (Signed)
Please keep well-hydrated and get plenty of rest. Have the home health nurse keep an eye on the wound sites.  There is some swelling but no sign of infection today. Follow-up with the surgeon Monday for further assessment now. If you note any fever, chills, increased redness, please call me or the surgeon.  Start the Robaxin in the evening to help with muscle spasm. Can take up to three times daily but may make you sleepy. I want you to contact the surgeon today to discuss the Robinul for secretions.  They will have more experience utilizing this medication for your particular issue.   Hang in there!

## 2017-12-27 NOTE — Progress Notes (Signed)
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 Had had follow-up with ENT earlier this week and again in 3 days. Is doing well overall but also noting increased secretions that he has to suction out. Notes his [facility] told him to request and Rx for Robinul.   Past Medical History:  Diagnosis Date  . Aortic stenosis    mild echocardiogram 8/09 EF 60%, showed no regional wall motion abnml, mild LVH, mod focal basal septal hypertrophy and mild dyastolic dysfunction. 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 mean gradient across aortic valve was 8 mmHG. also mild L atrial enlargement and normal RV size and function. minimal AS on LHC in 7/10.   . Arthritis    "all over" (07/19/2016)  . BPH (benign prostatic hypertrophy)   . CAD (coronary artery disease)    LCH (7/10) totally occluded proximal RCA with very robuse L to R collaterals, 50% proximal LAD stenosis, EF 65%, medical management.    . Chicken pox   . Chronic lower back pain    s/p surgical fusion  . Diverticulosis   . Esophageal stricture   . GERD (gastroesophageal reflux disease)   . Heart murmur   . Hepatitis B 1984  . Hiatal hernia   . HLD (hyperlipidemia)   . HTN (hypertension)   . 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 Osteoarthritis   . TIA (transient ischemic attack) 1990s   hx  . tonsillar ca dx'd 11/2017  . Tubular adenoma of colon 2009    Current Outpatient Medications on File Prior to Visit  Medication Sig Dispense Refill  . Oxycodone HCl 10 MG TABS Take 1 tablet (10 mg total) by mouth every 6 (six) hours. 90 tablet 0  . amLODipine (NORVASC) 10 MG tablet Take 1 tablet (10 mg total) by mouth daily. 90 tablet 3  . aspirin 81 MG EC tablet Take 81 mg by mouth at bedtime.     Marland Kitchen atorvastatin (LIPITOR) 40 MG tablet Take 1 tablet (40 mg total) by mouth daily. 90  tablet 3  . clonazePAM (KLONOPIN) 0.5 MG tablet TAKE 1 TABLET(0.5 MG) BY MOUTH TWICE DAILY AS NEEDED FOR ANXIETY (Patient not taking: Reported on 12/27/2017) 20 tablet 0  . isosorbide mononitrate (IMDUR) 30 MG 24 hr tablet TAKE 1 TABLET(30 MG) BY MOUTH AT BEDTIME 90 tablet 0  . lisinopril (PRINIVIL,ZESTRIL) 10 MG tablet Take 1 tablet (10 mg total) by mouth daily. 90 tablet 3  . metoprolol tartrate (LOPRESSOR) 25 MG tablet Take 0.5 tablets (12.5 mg total) by mouth 2 (two) times daily. 30 tablet 5  . pantoprazole (PROTONIX) 40 MG tablet Take 1 tablet (40 mg total) by mouth daily. (Patient not taking: Reported on 12/27/2017) 30 tablet 3   No current facility-administered medications on file prior to visit.     Allergies  Allergen Reactions  . Celebrex [Celecoxib] Hives and Itching    Family History  Problem Relation Age of Onset  . Heart disease Father 41       Living  . Coronary artery disease Father        CABG  . Alzheimer's disease Mother 71       Deceased  . Arthritis/Rheumatoid Mother   . Stomach cancer Maternal Uncle   . Brain cancer Maternal Aunt        x2  . Arthritis  Brother        DJD  . Obesity Daughter        Had Bypass Sx    Social History   Socioeconomic History  . Marital status: Married    Spouse name: etta  . Number of children: 2  . Years of education: Not on file  . Highest education level: Not on file  Occupational History  . Occupation: retired    Fish farm manager: RETIRED    Comment: disabled due to back problems  Social Needs  . Financial resource strain: Not on file  . Food insecurity:    Worry: Not on file    Inability: Not on file  . Transportation needs:    Medical: No    Non-medical: No  Tobacco Use  . Smoking status: Former Smoker    Packs/day: 1.50    Years: 50.00    Pack years: 75.00    Types: Cigarettes    Last attempt to quit: 09/05/2017    Years since quitting: 0.3  . Smokeless tobacco: Never Used  . Tobacco comment: smoked less than  1 ppd for 40+ years; Using Vapor Cig  Substance and Sexual Activity  . Alcohol use: Not Currently    Alcohol/week: 0.0 standard drinks    Comment: 07/19/2016 "might have a few drinks/year"  . Drug use: No  . Sexual activity: Never  Lifestyle  . Physical activity:    Days per week: Not on file    Minutes per session: Not on file  . Stress: Not on file  Relationships  . Social connections:    Talks on phone: Not on file    Gets together: Not on file    Attends religious service: Not on file    Active member of club or organization: Not on file    Attends meetings of clubs or organizations: Not on file    Relationship status: Not on file  Other Topics Concern  . Not on file  Social History Narrative   Married (3rd), [place]. 2 children from 1st marriage, 4 step children.    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       Cell # [id number]   Review of Systems - See HPI.  All other ROS are negative.  BP 108/62   Pulse 68   Resp 17   Ht _0  (1.702 m)   Wt 159 lb 8 oz (72.3 kg)   SpO2 97%   BMI 24.98 kg/m   Physical Exam  Constitutional: He appears well-developed and well-nourished.  HENT:  Head: Normocephalic and atraumatic.  Right Ear: Tympanic membrane and external ear normal.  Left Ear: Tympanic membrane and external ear normal.  Nose: Mucosal edema and rhinorrhea present.  Mouth/Throat: Oropharynx is clear and moist.  NG tube in place  Cardiovascular: Normal rate, regular rhythm, normal heart sounds and intact distal pulses.  Pulmonary/Chest: Effort normal and breath sounds normal.  Skin:     Vitals reviewed.   Recent Results (from the past 2160 hour(s))  Comprehensive metabolic panel     Status: Abnormal   Collection Time: 10/08/17 10:30 AM  Result Value Ref Range   Sodium 134 (L) 135 - 145 mmol/L   Potassium 3.9 3.5 - 5.1 mmol/L   Chloride 100 98 - 111 mmol/L   CO2 25 22 -  32 mmol/L   Glucose, Bld 167 (H) 70 - 99 mg/dL  BUN 8 8 - 23 mg/dL   Creatinine, Ser 0.75 0.61 - 1.24 mg/dL   Calcium 9.1 8.9 - 10.3 mg/dL   Total Protein 6.5 6.5 - 8.1 g/dL   Albumin 3.6 3.5 - 5.0 g/dL   AST 24 15 - 41 U/L   ALT 32 0 - 44 U/L   Alkaline Phosphatase 116 38 - 126 U/L   Total Bilirubin 1.4 (H) 0.3 - 1.2 mg/dL   GFR calc non Af Amer >60 >60 mL/min   GFR calc Af Amer >60 >60 mL/min    Comment: (NOTE) The eGFR has been calculated using the CKD EPI equation. This calculation has not been validated in all clinical situations. eGFR's persistently <60 mL/min signify possible Chronic Kidney Disease.    Anion gap 9 5 - 15    Comment: Performed at [facility] [address], [place], [place] [address]  Magnesium     Status: None   Collection Time: 11/06/17  9:29 AM  Result Value Ref Range   Magnesium 1.8 1.7 - 2.4 mg/dL    Comment: Performed at [facility], [place] [address], [place], [place] [address]  Comprehensive metabolic panel     Status: Abnormal   Collection Time: 11/06/17  9:29 AM  Result Value Ref Range   Sodium 137 135 - 145 mmol/L   Potassium 4.1 3.5 - 5.1 mmol/L   Chloride 99 98 - 111 mmol/L   CO2 30 22 - 32 mmol/L   Glucose, Bld 108 (H) 70 - 99 mg/dL   BUN 8 8 - 23 mg/dL   Creatinine, Ser 0.74 0.61 - 1.24 mg/dL   Calcium 9.4 8.9 - 10.3 mg/dL   Total Protein 6.8 6.5 - 8.1 g/dL   Albumin 3.8 3.5 - 5.0 g/dL   AST 24 15 - 41 U/L   ALT 31 0 - 44 U/L   Alkaline Phosphatase 136 (H) 38 - 126 U/L   Total Bilirubin 0.8 0.3 - 1.2 mg/dL   GFR calc non Af Amer >60 >60 mL/min   GFR calc Af Amer >60 >60 mL/min    Comment: (NOTE) The eGFR has been calculated using the CKD EPI equation. This calculation has not been validated in all clinical situations. eGFR's persistently <60 mL/min signify possible Chronic Kidney Disease.    Anion gap 8 5 - 15    Comment: Performed at [facility], [address]  [place], [place] [address]  T4, free     Status: None   Collection Time: 11/06/17  9:29 AM  Result Value Ref Range   Free T4 1.04 0.82 - 1.77 ng/dL    Comment: (NOTE) Biotin ingestion may interfere with free T4 tests. If the results are inconsistent with the TSH level, previous test results, or the clinical presentation, then consider biotin interference. If needed, order repeat testing after stopping biotin. Performed at [facility], [place] [address] [place], [place] [address]   CBC with Differential/Platelet     Status: Abnormal   Collection Time: 11/06/17  9:29 AM  Result Value Ref Range   WBC 6.6 4.0 - 10.3 K/uL   RBC 4.75 4.20 - 5.82 MIL/uL   Hemoglobin 14.3 13.0 - 17.1 g/dL   HCT 42.3 38.4 - 49.9 %   MCV 88.9 79.3 - 98.0 fL   MCH 30.1 27.2 - 33.4 pg   MCHC 33.8 32.0 - 36.0 g/dL   RDW 13.8 11.0 - 14.6 %   Platelets  135 (L) 140 - 400 K/uL   Neutrophils Relative % 60 %   Neutro Abs 4.0 1.5 - 6.5 K/uL   Lymphocytes Relative 21 %   Lymphs Abs 1.3 0.9 - 3.3 K/uL   Monocytes Relative 7 %   Monocytes Absolute 0.4 0.1 - 0.9 K/uL   Eosinophils Relative 11 %   Eosinophils Absolute 0.7 (H) 0.0 - 0.5 K/uL   Basophils Relative 1 %   Basophils Absolute 0.0 0.0 - 0.1 K/uL    Comment: Performed at [facility], [place] [address], [place], [place] [address]  TSH     Status: None   Collection Time: 11/06/17  9:30 AM  Result Value Ref Range   TSH 0.725 0.320 - 4.118 uIU/mL    Comment: Performed at [facility], [place] [address], [place], [place] [address]  Comprehensive metabolic panel     Status: Abnormal   Collection Time: 12/22/17 12:23 AM  Result Value Ref Range   Sodium 138 135 - 145 mmol/L   Potassium 4.1 3.5 - 5.1 mmol/L   Chloride 101 98 - 111 mmol/L   CO2 26 22 - 32 mmol/L   Glucose, Bld 132 (H) 70 - 99 mg/dL   BUN 24 (H) 8 - 23 mg/dL   Creatinine, Ser 0.77 0.61 - 1.24 mg/dL   Calcium 9.1 8.9 - 10.3 mg/dL   Total Protein 6.7  6.5 - 8.1 g/dL   Albumin 3.4 (L) 3.5 - 5.0 g/dL   AST 22 15 - 41 U/L   ALT 38 0 - 44 U/L   Alkaline Phosphatase 201 (H) 38 - 126 U/L   Total Bilirubin 0.9 0.3 - 1.2 mg/dL   GFR calc non Af Amer >60 >60 mL/min   GFR calc Af Amer >60 >60 mL/min    Comment: (NOTE) The eGFR has been calculated using the CKD EPI equation. This calculation has not been validated in all clinical situations. eGFR's persistently <60 mL/min signify possible Chronic Kidney Disease.    Anion gap 11 5 - 15    Comment: Performed at [facility], [place] [address], [place], [place] [address]  CBC WITH DIFFERENTIAL     Status: Abnormal   Collection Time: 12/22/17 12:23 AM  Result Value Ref Range   WBC 8.6 4.0 - 10.5 K/uL   RBC 3.52 (L) 4.22 - 5.81 MIL/uL   Hemoglobin 10.3 (L) 13.0 - 17.0 g/dL   HCT 33.1 (L) 39.0 - 52.0 %   MCV 94.0 80.0 - 100.0 fL   MCH 29.3 26.0 - 34.0 pg   MCHC 31.1 30.0 - 36.0 g/dL   RDW 13.7 11.5 - 15.5 %   Platelets 358 150 - 400 K/uL   nRBC 0.0 0.0 - 0.2 %   Neutrophils Relative % 73 %   Neutro Abs 6.3 1.7 - 7.7 K/uL   Lymphocytes Relative 12 %   Lymphs Abs 1.0 0.7 - 4.0 K/uL   Monocytes Relative 9 %   Monocytes Absolute 0.7 0.1 - 1.0 K/uL   Eosinophils Relative 5 %   Eosinophils Absolute 0.4 0.0 - 0.5 K/uL   Basophils Relative 0 %   Basophils Absolute 0.0 0.0 - 0.1 K/uL   Immature Granulocytes 1 %   Abs Immature Granulocytes 0.09 (H) 0.00 - 0.07 K/uL    Comment: Performed at [facility], [place] [address] [place], [place] [address]  I-Stat CG4 Lactic Acid, ED     Status: None  Collection Time: 12/22/17 12:31 AM  Result Value Ref Range   Lactic Acid, Venous 0.93 0.5 - 1.9 mmol/L    Assessment/Plan: 1. Trapezius muscle spasm Rx Flexeril to help with this. To be taken in the evening. Supportive measures and OTC medications reviewed.  2. 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 Discussed with patient that he needs to discuss addition  of Robinul with his ENT/Oncologist as I have not prescribed this for cause other than hyperhidrosis and want their input regarding the matter. He agrees to discuss with ENT on Monday.    Leeanne Rio, PA-C

## 2017-12-30 DIAGNOSIS — C099 Malignant neoplasm of tonsil, unspecified: Secondary | ICD-10-CM | POA: Diagnosis not present

## 2017-12-30 DIAGNOSIS — R1312 Dysphagia, oropharyngeal phase: Secondary | ICD-10-CM | POA: Diagnosis not present

## 2017-12-31 ENCOUNTER — Ambulatory Visit
Admission: RE | Admit: 2017-12-31 | Discharge: 2017-12-31 | Disposition: A | Discharge status: Home / Self Care | Payer: Medicare Other | Source: Ambulatory Visit | Attending: Radiation Oncology | Admitting: Radiation Oncology

## 2017-12-31 ENCOUNTER — Encounter: Payer: Self-pay | Admitting: *Deleted

## 2017-12-31 ENCOUNTER — Encounter: Payer: Self-pay | Admitting: Radiation Oncology

## 2017-12-31 ENCOUNTER — Other Ambulatory Visit: Payer: Self-pay

## 2017-12-31 VITALS — BP 123/98 | HR 112 | Temp 97.9°F | Resp 20 | Ht 67.0 in | Wt 159.6 lb

## 2017-12-31 DIAGNOSIS — Z79899 Other long term (current) drug therapy: Secondary | ICD-10-CM | POA: Insufficient documentation

## 2017-12-31 DIAGNOSIS — C090 Malignant neoplasm of tonsillar fossa: Secondary | ICD-10-CM | POA: Insufficient documentation

## 2017-12-31 DIAGNOSIS — Z9049 Acquired absence of other specified parts of digestive tract: Secondary | ICD-10-CM | POA: Diagnosis not present

## 2017-12-31 DIAGNOSIS — G8929 Other chronic pain: Secondary | ICD-10-CM | POA: Diagnosis not present

## 2017-12-31 DIAGNOSIS — Z7982 Long term (current) use of aspirin: Secondary | ICD-10-CM | POA: Insufficient documentation

## 2017-12-31 DIAGNOSIS — Z1329 Encounter for screening for other suspected endocrine disorder: Secondary | ICD-10-CM | POA: Insufficient documentation

## 2017-12-31 NOTE — Progress Notes (Signed)
Radiation Oncology         [phone] ________________________________  Name: Brandon [name] MRN: [id number]  Date: 12/31/2017  DOB: [birth year]  Follow-Up Visit Note  CC: Lebron Conners, MD  Diagnosis:       ICD-10-CM   1. 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  Since consultation, he underwent left tonsillectomy and neck dissection with tracheostomy placement on 12/02/17. 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 The anterior soft tissue margin was positive for carcinoma, and all other margins were negative, but close. Perineural invasion and lymphovascular invasion were present. The tumor extended to involve the submandibular gland. 32 lymph nodes were negative for metastatic carcinoma.   On review of systems, the patient reports weight loss. He reports left-sided neck cramping and pain. He reports swallowing issues and currently has a feeding tube through his nose. He saw ST at Dr. Servando Salina office yesterday. He states that they are discussing placing a PEG tube and removing the NG tube. He reports thick sputum. He is frequently suctioning himself.      ALLERGIES:  is allergic to celebrex [celecoxib].  Meds: Current Outpatient Medications  Medication Sig Dispense Refill  . amLODipine (NORVASC) 10 MG tablet Take 1 tablet (10 mg total) by mouth daily. 90 tablet 3  . aspirin 81 MG EC tablet Take 81 mg by mouth at bedtime.     . isosorbide  mononitrate (IMDUR) 30 MG 24 hr tablet TAKE 1 TABLET(30 MG) BY MOUTH AT BEDTIME 90 tablet 0  . methocarbamol (ROBAXIN) 500 MG tablet Take 1 tablet (500 mg total) by mouth 4 (four) times daily. 30 tablet 0  . metoprolol tartrate (LOPRESSOR) 25 MG tablet Take 0.5 tablets (12.5 mg total) by mouth 2 (two) times daily. 30 tablet 5  . Oxycodone HCl 10 MG TABS Take 1 tablet (10 mg total) by mouth every 6 (six) hours. 90 tablet 0  . atorvastatin (LIPITOR) 40 MG tablet Take 1 tablet (40 mg total) by mouth daily. 90 tablet 3  . clonazePAM (KLONOPIN) 0.5 MG tablet TAKE 1 TABLET(0.5 MG) BY MOUTH TWICE DAILY AS NEEDED FOR ANXIETY (Patient not taking: Reported on 12/27/2017) 20 tablet 0  . lisinopril (PRINIVIL,ZESTRIL) 10 MG tablet Take 1 tablet (10 mg total) by mouth daily. 90 tablet 3  . pantoprazole (PROTONIX) 40 MG tablet Take 1 tablet (40 mg total) by mouth daily. (Patient not taking: Reported on 12/27/2017) 30 tablet 3   No current facility-administered medications for this encounter.     Physical Findings: The patient is in no acute distress. Patient is alert and oriented. Wt Readings from Last 3 Encounters:  12/31/17 159 lb 9.6 oz (72.4 kg)  12/27/17 159 lb 8 oz (72.3 kg)  12/21/17 160 lb (72.6 kg)    height is 5\' 7"  (1.702 m) and weight is 159 lb 9.6 oz (72.4 kg). His temperature is 97.9 F (36.6 C). His blood pressure is 123/98 (abnormal) and his pulse is 112 (abnormal). His respiration is 20  and oxygen saturation is 96%.  General: Alert and oriented, in no acute distress. HEENT: Head is normocephalic. He has an NG tube intact. He's healing in the left oropharynx. His tongue deviates to the left. He still has some granulation tissue in the left posterior gum as well as the left oropharynx. Neck: He still has some open wounds with overlying gauze over the left neck. 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 His trach is intact. Psychiatric: Judgment and insight are intact. Affect is  appropriate.   Lab Findings: Lab Results  Component Value Date   WBC 8.6 12/22/2017   HGB 10.3 (L) 12/22/2017   HCT 33.1 (L) 12/22/2017   MCV 94.0 12/22/2017   PLT 358 12/22/2017    Lab Results  Component Value Date   TSH 0.725 11/06/2017    Radiographic Findings: Ct Soft Tissue Neck W Contrast  Result Date: 12/22/2017 CLINICAL DATA:  Tonsillar cancer with recent resection. EXAM: CT NECK WITH CONTRAST TECHNIQUE: Multidetector CT imaging of the neck was performed using the standard protocol following the bolus administration of intravenous contrast. CONTRAST:  70mL OMNIPAQUE IOHEXOL 300 MG/ML  SOLN COMPARISON:  CT neck 09/26/2017 FINDINGS: PHARYNX AND LARYNX: --Nasopharynx: Fossae of Rosenmuller are clear. Normal adenoid tonsils for age. --Oral cavity and oropharynx: Status post resection of left palatine tonsillar mass. The tongue is deviated to the right. There is soft tissue thickening surrounding the resection site and extending into the left parotid space, left submandibular space and left retromolar trigone. No abscess or fluid collection. --Hypopharynx: Normal vallecula and pyriform sinuses. --Larynx: Normal epiglottis and pre-epiglottic space. Normal aryepiglottic and vocal folds. Status post tracheostomy. --Retropharyngeal space: No abscess, effusion or lymphadenopathy. SALIVARY GLANDS: --Parotid: Normal right parotid gland. The left parotid gland itself is normal. There is soft tissue thickening within the posterior left parotid space. --Submandibular: Soft tissue thickening surrounding the left submandibular gland. Right submandibular gland is normal. --Sublingual: Normal THYROID: Normal. LYMPH NODES: Status post left neck dissection. There is postsurgical change including areas of soft tissue thickening. No discrete residual lymph nodes. No right-sided lymphadenopathy. VASCULAR: There is bilateral carotid bifurcation atherosclerosis but the internal carotid arteries remain patent.  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 LIMITED INTRACRANIAL: Normal. VISUALIZED ORBITS: Normal. MASTOIDS AND VISUALIZED PARANASAL SINUSES: No fluid levels or advanced mucosal thickening. No mastoid effusion. SKELETON: No bony spinal canal stenosis. No lytic or blastic lesions. UPPER CHEST: There is an open wound of the anterior left supraclavicular fossa with underlying vascular clips and soft tissue thickening. No abscess or fluid collection. OTHER: None. IMPRESSION: 1. Status post resection of left palatine tonsillar mass with surrounding soft tissue thickening that may be postsurgical. No abscess or fluid collection. Attention on follow-up is warranted to exclude recurrent or residual tumor. 2. Status post left neck dissection with open wound of the anterior left supraclavicular fossa. No abscess or fluid collection. 3. Status post tracheostomy. Electronically Signed   By: Ulyses Jarred M.D.   On: 12/22/2017 03:03    Impression/Plan:    1) Head and Neck Cancer Status: This is a delightful patient with tonsillar cancer, status post tonsillectomy and neck dissection on 12/02/17. I recommend 6-7 weeks of adjuvant treatment to the tumor bed + most high risk nodal regions (at least IA and ipsilateral IB per my discussion with Dr Nicolette Bang; I may treat more).  We again discussed the potential risks, benefits, and side effects of radiotherapy. We talked in detail about acute and late effects. We discussed  that some of the most bothersome acute effects may be mucositis, dysgeusia, salivary changes, skin irritation, hair loss, dehydration, weight loss and fatigue. We talked about late effects which include but are not necessarily limited to dysphagia, hypothyroidism, nerve injury, spinal cord injury, xerostomia, trismus, and neck edema. No guarantees of treatment were given. A consent form was signed and placed in the patient's medical record. The patient is enthusiastic about proceeding with treatment. I look  forward to participating in the patient's care.    Simulation (treatment planning) will take place today with treatment to begin 6 weeks out from surgery. Tentative start date: January 16, 2018.  We also discussed that the treatment of head and neck cancer is a multidisciplinary process to maximize treatment outcomes and quality of life. For this reason  the following referrals have been or will be made:  1. Nutritionist for nutrition support during and after treatment. We discussed that he may require supplementation with the use of a PEG tube.  2. Speech language pathology for swallowing and/or speech therapy. He is currently seeing a swallowing therapist at [facility].  3. Social work for social support and financial counseling.   4. Physical therapy due to risk of lymphedema in neck and deconditioning. I advised the patient to inform the PT of his neck cramping and pain.  5) PEG placement  6) ENT board discussion  Risk Factors: The patient has been educated about risk factors including alcohol and tobacco abuse; they understand that avoidance of alcohol and tobacco is important to prevent recurrences as well as other cancers  I spent 25 minutes face to face with the patient and more than 50% of that time was spent in counseling and/or coordination of care. _____________________________________   Eppie Gibson, MD  This document serves as a record of services personally performed by Eppie Gibson, MD. It was created on her behalf by Rae Lips, a trained medical scribe. The creation of this record is based on the scribe's personal observations and the provider's statements to them. This document has been checked and approved by the attending provider.

## 2017-12-31 NOTE — Progress Notes (Signed)
Head and Neck Cancer Simulation, IMRT treatment planning note   Outpatient  Diagnosis:    ICD-10-CM   1. 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     The patient was taken to the CT simulator and laid in the supine position on the table. An Aquaplast head and shoulder mask was custom fitted to the patient's anatomy. High-resolution CT axial imaging was obtained of the head and neck with contrast. I verified that the quality of the imaging is good for treatment planning. 1 Medically Necessary Treatment Device was fabricated and supervised by me: Aquaplast mask.  Treatment planning note I plan to treat the patient with IMRT. I plan to treat the patient's tumor bed +/- regional nodes in the neck.  I plan to treat to a total dose of 66 Gray in 33  fractions. Dose calculation was ordered from dosimetry.  IMRT planning Note  IMRT is medically necessary and an important modality to deliver adequate dose to the patient's at risk tissues while sparing the patient's normal structures, including the: esophagus, parotid tissue, mandible, brain stem, spinal cord, oral cavity, brachial plexus.  This justifies the use of IMRT in the patient's treatment.   -----------------------------------  Eppie Gibson, MD

## 2018-01-01 ENCOUNTER — Encounter: Payer: Self-pay | Admitting: Radiation Oncology

## 2018-01-01 ENCOUNTER — Other Ambulatory Visit: Payer: Self-pay | Admitting: Radiation Oncology

## 2018-01-01 DIAGNOSIS — C090 Malignant neoplasm of tonsillar fossa: Secondary | ICD-10-CM

## 2018-01-01 NOTE — Patient Instructions (Signed)
esctge

## 2018-01-01 NOTE — Progress Notes (Signed)
Oncology Nurse Navigator Documentation  Met with Mr. Meidinger during appt with Dr. Isidore Moos for discussion/CT [facility] for adjuvant RT following 12/02/17 TORS with Dr. Nicolette Bang, [facility].  He was accompanied by his wife.  He arrived with nasogastric tube which he continues to use pending PEG placement per his return to [facility].  Dr. Isidore Moos to order PEG placement and NG removal.  I provided introductory PEG education using teaching device.  He provided correct return demonstration.  He is currently being seen by SLP [facility], most recently appt yesterday.  He was advanced to pureed foods, soft foods eg mashed potatoes.  Next appt 12/11 in conjunction with surgical follow-up with Dr. Nicolette Bang.  He voiced understanding I am scheduling him for 12/10 H&N MDC to meet with PT, Nutrition, SW and Financial Support Specialist and SLP if he has been discharged by [facility].  I provided him a tentative 0800 arrival time, explained I will call him to confirm.  I escorted him to [facility] for CT planning with open-face Aquaplast mask. I encouraged him to call me with questions/concerns as he continues with pre-RT activities.  Gayleen Orem, RN, BSN Head & Neck Oncology Nurse [place] [facility] [place] [phone]

## 2018-01-03 ENCOUNTER — Encounter: Payer: Self-pay | Admitting: General Practice

## 2018-01-03 NOTE — Progress Notes (Signed)
[place] Psychosocial Distress Screening Clinical Social Work  Clinical Social Work was referred by distress screening protocol.  The patient scored a 6 on the Psychosocial Distress Thermometer which indicates moderate distress. Clinical Social Worker contacted patient by phone to assess for distress and other psychosocial needs. Spoke w wife who says patient has difficulty speaking and hearing.  Wife is caregiver, provided information on [place] and encouraged accessing if resources/support are needed.  Family will meet w SW during upcoming [place].    ONCBCN DISTRESS SCREENING 12/31/2017  Screening Type   Distress experienced in past week (1-10) 6  Family Problem type Partner  Emotional problem type Adjusting to illness;Boredom;Adjusting to appearance changes  Information Concerns Type   Physical Problem type Pain;Talking;Constipation/diarrhea;Changes in urination;Skin dry/itchy    Clinical Social Worker follow up needed: No.  If yes, follow up plan:  Beverely Pace, [place], LCSW Clinical Social Worker Phone:  [phone]

## 2018-01-06 ENCOUNTER — Other Ambulatory Visit: Payer: Self-pay | Admitting: Physician Assistant

## 2018-01-06 ENCOUNTER — Telehealth: Payer: Self-pay | Admitting: *Deleted

## 2018-01-06 NOTE — Telephone Encounter (Addendum)
Copied from [place] [id number]. Topic: Quick Communication - See Telephone Encounter >> Jan 06, 2018  9:17 AM Ivar Drape wrote: CRM for notification. See Telephone encounter for: 01/06/18. Patient would like a refill on the following medications and have them sent to his preferred pharmacy [facility] on [address].  1) Oxycodone HCl 10 MG TABS  2) methocarbamol (ROBAXIN) 500 MG tablet

## 2018-01-06 NOTE — Telephone Encounter (Signed)
Oncology Nurse Navigator Documentation  Spoke with pt's wife confirming their understanding of 12/4 12:00 arrival to [facility] Radiology for PEG placement. They agreed to meet with me 1115 for PEG education preceding procedure. I confirmed their understanding of NPO status 6 hrs prior to procedure.  Gayleen Orem, RN, BSN Head & Neck Oncology Nurse [employer] at [place] [phone]

## 2018-01-07 ENCOUNTER — Other Ambulatory Visit: Payer: Self-pay | Admitting: Radiology

## 2018-01-07 MED ORDER — METHOCARBAMOL 500 MG PO TABS: 500.0000 mg | ORAL_TABLET | Freq: Four times a day (QID) | ORAL | 0 refills | Status: DC

## 2018-01-07 NOTE — Telephone Encounter (Signed)
Requested medication (s) are due for refill today: Oxycodone: No  Robaxin: yes  Requested medication (s) are on the active medication list: yes to both  Last refill:  Oxycodone: 12/20/17      Robaxin: 12/27/17  Future visit scheduled: no  Notes to clinic:  Controlled substance   Requested Prescriptions  Pending Prescriptions Disp Refills   Oxycodone HCl 10 MG TABS 90 tablet 0    Sig: Take 1 tablet (10 mg total) by mouth every 6 (six) hours.     Not Delegated - Analgesics:  Opioid Agonists Failed - 01/06/2018  1:37 PM      Failed - This refill cannot be delegated      Failed - Urine Drug Screen completed in last 360 days.      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

## 2018-01-08 ENCOUNTER — Ambulatory Visit (HOSPITAL_COMMUNITY)
Admission: RE | Admit: 2018-01-08 | Discharge: 2018-01-08 | Disposition: A | Discharge status: Home / Self Care | Payer: Medicare Other | Source: Ambulatory Visit | Attending: Radiation Oncology | Admitting: Radiation Oncology

## 2018-01-08 ENCOUNTER — Other Ambulatory Visit: Payer: Self-pay | Admitting: Physician Assistant

## 2018-01-08 ENCOUNTER — Encounter: Payer: Self-pay | Admitting: *Deleted

## 2018-01-08 ENCOUNTER — Encounter (HOSPITAL_COMMUNITY): Payer: Self-pay

## 2018-01-08 DIAGNOSIS — M1990 Unspecified osteoarthritis, unspecified site: Secondary | ICD-10-CM | POA: Diagnosis not present

## 2018-01-08 DIAGNOSIS — E785 Hyperlipidemia, unspecified: Secondary | ICD-10-CM | POA: Diagnosis not present

## 2018-01-08 DIAGNOSIS — C099 Malignant neoplasm of tonsil, unspecified: Secondary | ICD-10-CM | POA: Insufficient documentation

## 2018-01-08 DIAGNOSIS — K219 Gastro-esophageal reflux disease without esophagitis: Secondary | ICD-10-CM | POA: Insufficient documentation

## 2018-01-08 DIAGNOSIS — Z431 Encounter for attention to gastrostomy: Secondary | ICD-10-CM | POA: Insufficient documentation

## 2018-01-08 DIAGNOSIS — Z9889 Other specified postprocedural states: Secondary | ICD-10-CM | POA: Insufficient documentation

## 2018-01-08 DIAGNOSIS — Z9049 Acquired absence of other specified parts of digestive tract: Secondary | ICD-10-CM | POA: Diagnosis not present

## 2018-01-08 DIAGNOSIS — Z9841 Cataract extraction status, right eye: Secondary | ICD-10-CM | POA: Insufficient documentation

## 2018-01-08 DIAGNOSIS — C090 Malignant neoplasm of tonsillar fossa: Secondary | ICD-10-CM

## 2018-01-08 DIAGNOSIS — Z981 Arthrodesis status: Secondary | ICD-10-CM | POA: Insufficient documentation

## 2018-01-08 DIAGNOSIS — I10 Essential (primary) hypertension: Secondary | ICD-10-CM | POA: Diagnosis not present

## 2018-01-08 DIAGNOSIS — Z7982 Long term (current) use of aspirin: Secondary | ICD-10-CM | POA: Diagnosis not present

## 2018-01-08 DIAGNOSIS — N400 Enlarged prostate without lower urinary tract symptoms: Secondary | ICD-10-CM | POA: Diagnosis not present

## 2018-01-08 DIAGNOSIS — Z9842 Cataract extraction status, left eye: Secondary | ICD-10-CM | POA: Insufficient documentation

## 2018-01-08 DIAGNOSIS — C029 Malignant neoplasm of tongue, unspecified: Secondary | ICD-10-CM | POA: Diagnosis not present

## 2018-01-08 DIAGNOSIS — I2510 Atherosclerotic heart disease of native coronary artery without angina pectoris: Secondary | ICD-10-CM | POA: Diagnosis not present

## 2018-01-08 DIAGNOSIS — Z886 Allergy status to analgesic agent status: Secondary | ICD-10-CM | POA: Diagnosis not present

## 2018-01-08 DIAGNOSIS — Z79899 Other long term (current) drug therapy: Secondary | ICD-10-CM | POA: Diagnosis not present

## 2018-01-08 DIAGNOSIS — C760 Malignant neoplasm of head, face and neck: Secondary | ICD-10-CM | POA: Diagnosis not present

## 2018-01-08 DIAGNOSIS — Z8673 Personal history of transient ischemic attack (TIA), and cerebral infarction without residual deficits: Secondary | ICD-10-CM | POA: Insufficient documentation

## 2018-01-08 HISTORY — PX: IR GASTROSTOMY TUBE MOD SED: IMG625

## 2018-01-08 LAB — COMPREHENSIVE METABOLIC PANEL
ALT: 35 U/L (ref 0–44)
AST: 26 U/L (ref 15–41)
Albumin: 3.8 g/dL (ref 3.5–5.0)
Alkaline Phosphatase: 151 U/L — ABNORMAL HIGH (ref 38–126)
Anion gap: 10 (ref 5–15)
BUN: 23 mg/dL (ref 8–23)
CHLORIDE: 104 mmol/L (ref 98–111)
CO2: 26 mmol/L (ref 22–32)
Calcium: 9.1 mg/dL (ref 8.9–10.3)
Creatinine, Ser: 0.54 mg/dL — ABNORMAL LOW (ref 0.61–1.24)
GFR calc non Af Amer: >60 mL/min (ref >60)
Glucose, Bld: 116 mg/dL — ABNORMAL HIGH (ref 70–99)
Potassium: 3.8 mmol/L (ref 3.5–5.1)
Sodium: 140 mmol/L (ref 135–145)
Total Bilirubin: 1 mg/dL (ref 0.3–1.2)
Total Protein: 6.7 g/dL (ref 6.5–8.1)

## 2018-01-08 LAB — APTT: aPTT: 32 seconds (ref 24–36)

## 2018-01-08 LAB — CBC
HCT: 34.3 % — ABNORMAL LOW (ref 39.0–52.0)
HEMOGLOBIN: 10.8 g/dL — AB (ref 13.0–17.0)
MCH: 28.8 pg (ref 26.0–34.0)
MCHC: 31.5 g/dL (ref 30.0–36.0)
MCV: 91.5 fL (ref 80.0–100.0)
Platelets: 154 10*3/uL (ref 150–400)
RBC: 3.75 MIL/uL — ABNORMAL LOW (ref 4.22–5.81)
RDW: 13 % (ref 11.5–15.5)
WBC: 5.9 10*3/uL (ref 4.0–10.5)
nRBC: 0 % (ref 0.0–0.2)

## 2018-01-08 LAB — PROTIME-INR
INR: 1.03
Prothrombin Time: 13.4 seconds (ref 11.4–15.2)

## 2018-01-08 MED ORDER — KETOROLAC TROMETHAMINE 30 MG/ML IJ SOLN
INTRAMUSCULAR | Status: AC
  Filled 2018-01-08: qty 1

## 2018-01-08 MED ORDER — KETOROLAC TROMETHAMINE 30 MG/ML IJ SOLN: 30.0000 mg | Freq: Once | INTRAMUSCULAR | Status: DC

## 2018-01-08 MED ORDER — SODIUM CHLORIDE 0.9 % IV SOLN
INTRAVENOUS | Status: DC
  Administered 2018-01-08: 13:00:00 via INTRAVENOUS

## 2018-01-08 MED ORDER — IOPAMIDOL (ISOVUE-300) INJECTION 61%
INTRAVENOUS | Status: AC
  Filled 2018-01-08: qty 50

## 2018-01-08 MED ORDER — LIDOCAINE HCL 1 % IJ SOLN
INTRAMUSCULAR | Status: AC | PRN
  Administered 2018-01-08: 10 mL

## 2018-01-08 MED ORDER — LIDOCAINE HCL 1 % IJ SOLN
INTRAMUSCULAR | Status: AC
  Filled 2018-01-08: qty 20

## 2018-01-08 MED ORDER — MIDAZOLAM HCL 2 MG/2ML IJ SOLN
INTRAMUSCULAR | Status: AC | PRN
  Administered 2018-01-08 (×4): 1 mg via INTRAVENOUS

## 2018-01-08 MED ORDER — FENTANYL CITRATE (PF) 100 MCG/2ML IJ SOLN
INTRAMUSCULAR | Status: AC
  Administered 2018-01-08: 25 ug
  Filled 2018-01-08: qty 2

## 2018-01-08 MED ORDER — FENTANYL CITRATE (PF) 100 MCG/2ML IJ SOLN
INTRAMUSCULAR | Status: AC
  Filled 2018-01-08: qty 2

## 2018-01-08 MED ORDER — CEFAZOLIN SODIUM-DEXTROSE 2-4 GM/100ML-% IV SOLN
INTRAVENOUS | Status: AC
  Administered 2018-01-08: 2 g via INTRAVENOUS
  Filled 2018-01-08: qty 100

## 2018-01-08 MED ORDER — MIDAZOLAM HCL 2 MG/2ML IJ SOLN
INTRAMUSCULAR | Status: AC
  Filled 2018-01-08: qty 2

## 2018-01-08 MED ORDER — FENTANYL CITRATE (PF) 100 MCG/2ML IJ SOLN: 25.0000 ug | Freq: Once | INTRAMUSCULAR | Status: DC

## 2018-01-08 MED ORDER — FENTANYL CITRATE (PF) 100 MCG/2ML IJ SOLN
INTRAMUSCULAR | Status: AC | PRN
  Administered 2018-01-08 (×2): 50 ug via INTRAVENOUS

## 2018-01-08 MED ORDER — GLUCAGON HCL RDNA (DIAGNOSTIC) 1 MG IJ SOLR
INTRAMUSCULAR | Status: AC
  Filled 2018-01-08: qty 1

## 2018-01-08 MED ORDER — IOPAMIDOL (ISOVUE-300) INJECTION 61%
50.0000 mL | Freq: Once | INTRAVENOUS | Status: AC | PRN
  Administered 2018-01-08: 15 mL

## 2018-01-08 MED ORDER — GLUCAGON HCL (RDNA) 1 MG IJ SOLR
INTRAMUSCULAR | Status: AC | PRN
  Administered 2018-01-08: .5 mL via INTRAVENOUS

## 2018-01-08 MED ORDER — CEFAZOLIN SODIUM-DEXTROSE 2-4 GM/100ML-% IV SOLN
2.0000 g | INTRAVENOUS | Status: AC
  Administered 2018-01-08: 2 g via INTRAVENOUS

## 2018-01-08 NOTE — Discharge Instructions (Signed)
Gastrostomy Tube Home Guide, Adult A gastrostomy tube is a tube that is surgically placed into the stomach. It is also called a G-tube. G-tubes are used when a person is unable to eat and drink enough on their own to stay healthy. The tube is inserted into the stomach through a small cut (incision) in the skin. This tube is used for:  Feeding.  Giving medication.  Gastrostomy tube care  Wash your hands with soap and water.  Remove the old dressing (if any). Some styles of G-tubes may need a dressing inserted between the skin and the G-tube. Other types of G-tubes do not require a dressing. Ask your health care provider if a dressing is needed.  Check the area where the tube enters the skin (insertion site) for redness, swelling, or pus-like (purulent) drainage. A small amount of clear or tan liquid drainage is normal. Check to make sure scar tissue (skin) is not growing around the insertion site. This could have a raised, bumpy appearance.  A cotton swab can be used to clean the skin around the tube: ? When the G-tube is first put in, a normal saline solution or water can be used to clean the skin. ? Mild soap and warm water can be used when the skin around the G-tube site has healed. ? Roll the cotton swab around the G-tube insertion site to remove any drainage or crusting at the insertion site. Stomach residuals Feeding tube residuals are the amount of liquids that are in the stomach at any given time. Residuals may be checked before giving feedings, medications, or as instructed by your health care provider.  Ask your health care provider if there are instances when you would not start tube feedings depending on the amount or type of contents withdrawn from the stomach.  Check residuals by attaching a syringe to the G-tube and pulling back on the syringe plunger. Note the amount, and return the residual back into the stomach.  Flushing the G-tube  The G-tube should be periodically  flushed with clean warm water to keep it from clogging. ? Flush the G-tube after feedings or medications. Draw up 30 mL of warm water in a syringe. Connect the syringe to the G-tube and slowly push the water into the tube. ? Do not push feedings, medications, or flushes rapidly. Flush the G-tube gently and slowly. ? Only use syringes made for G-tubes to flush medications or feedings. ? Your health care provider may want the G-tube flushed more often or with more water. If this is the case, follow your health care provider's instructions. Feedings Your health care provider will determine whether feedings are given as a bolus (a certain amount given at one time and at scheduled times) or whether feedings will be given continuously on a feeding pump.  Formulas should be given at room temperature.  If feedings are continuous, no more than 4 hours worth of feedings should be placed in the feeding bag. This helps prevent spoilage or accidental excess infusion.  Cover and place unused formula in the refrigerator.  If feedings are continuous, stop the feedings when medications or flushes are given. Be sure to restart the feedings.  Feeding bags and syringes should be replaced as instructed by your health care provider.  Giving medication  In general, it is best if all medications are in a liquid form for G-tube administration. Liquid medications are less likely to clog the G-tube. ? Mix the liquid medication with 30 mL (or amount recommended  by your health care provider) of warm water. ? Draw up the medication into the syringe. ? Attach the syringe to the G-tube and slowly push the mixture into the G-tube. ? After giving the medication, draw up 30 mL of warm water in the syringe and slowly flush the G-tube.  For pills or capsules, check with your health care provider first before crushing medications. Some pills are not effective if they are crushed. Some capsules are sustained-release  medications. ? If appropriate, crush the pill or capsule and mix with 30 mL of warm water. Using the syringe, slowly push the medication through the tube, then flush the tube with another 30 mL of tap water. G-tube problems G-tube was pulled out.  Cause: May have been pulled out accidentally.  Solutions: Cover the opening with clean dressing and tape. Call your health care provider right away. The G-tube should be put in as soon as possible (within 4 hours) so the G-tube opening (tract) does not close. The G-tube needs to be put in at a health care setting. An X-ray needs to be done to confirm placement before the G-tube can be used again.  Redness, irritation, soreness, or foul odor around the gastrostomy site.  Cause: May be caused by leakage or infection.  Solutions: Call your health care provider right away.  Large amount of leakage of fluid or mucus-like liquid present (a large amount means it soaks clothing).  Cause: Many reasons could cause the G-tube to leak.  Solutions: Call your health care provider to discuss the amount of leakage.  Skin or scar tissue appears to be growing where tube enters skin.  Cause: Tissue growth may develop around the insertion site if the G-tube is moved or pulled on excessively.  Solutions: Secure tube with tape so that excess movement does not occur. Call your health care provider.  G-tube is clogged.  Cause: Thick formula or medication.  Solutions: Try to slowly push warm water into the tube with a large syringe. Never try to push any object into the tube to unclog it. Do not force fluid into the G-tube. If you are unable to unclog the tube, call your health care provider right away.  Tips  Head of bed (HOB) position refers to the upright position of a person's upper body. ? When giving medications or a feeding bolus, keep the [facility] up as told by your health care provider. Do this during the feeding and for 1 hour after the feeding or  medication administration. ? If continuous feedings are being given, it is best to keep the [facility] up as told by your health care provider. When ADLs (activities of daily living) are performed and the [facility] needs to be flat, be sure to turn the feeding pump off. Restart the feeding pump when the [facility] is returned to the recommended height.  Do not pull or put tension on the tube.  To prevent fluid backflow, kink the G-tube before removing the cap or disconnecting a syringe.  Check the G-tube length every day. Measure from the insertion site to the end of the G-tube. If the length is longer than previous measurements, the tube may be coming out. Call your health care provider if you notice increasing G-tube length.  Oral care, such as brushing teeth, must be continued.  You may need to remove excess air (vent) from the G-tube. Your health care provider will tell you if this is needed.  Always call your health care provider if you have  questions or problems with the G-tube. Get help right away if:  You have severe abdominal pain, tenderness, or abdominal bloating (distension).  You have nausea or vomiting.  You are constipated or have problems moving your bowels.  The G-tube insertion site is red, swollen, has a foul smell, or has yellow or brown drainage.  You have difficulty breathing or shortness of breath.  You have a fever.  You have a large amount of feeding tube residuals.  The G-tube is clogged and cannot be flushed. This information is not intended to replace advice given to you by your health care provider. Make sure you discuss any questions you have with your health care provider. Document Released: 04/02/2001 Document Revised: 06/30/2015 Document Reviewed: 09/29/2012 Elsevier Interactive Patient Education  2017 [place].  Moderate Conscious Sedation, Adult, Care After These instructions provide you with information about caring for yourself after your procedure. Your  health care provider may also give you more specific instructions. Your treatment has been planned according to current medical practices, but problems sometimes occur. Call your health care provider if you have any problems or questions after your procedure. What can I expect after the procedure? After your procedure, it is common:  To feel sleepy for several hours.  To feel clumsy and have poor balance for several hours.  To have poor judgment for several hours.  To vomit if you eat too soon.  Follow these instructions at home: For at least 24 hours after the procedure:   Do not: ? Participate in activities where you could fall or become injured. ? Drive. ? Use heavy machinery. ? Drink alcohol. ? Take sleeping pills or medicines that cause drowsiness. ? Make important decisions or sign legal documents. ? Take care of children on your own.  Rest. Eating and drinking  Follow the diet recommended by your health care provider.  If you vomit: ? Drink water, juice, or soup when you can drink without vomiting. ? Make sure you have little or no nausea before eating solid foods. General instructions  Have a responsible adult stay with you until you are awake and alert.  Take over-the-counter and prescription medicines only as told by your health care provider.  If you smoke, do not smoke without supervision.  Keep all follow-up visits as told by your health care provider. This is important. Contact a health care provider if:  You keep feeling nauseous or you keep vomiting.  You feel light-headed.  You develop a rash.  You have a fever. Get help right away if:  You have trouble breathing. This information is not intended to replace advice given to you by your health care provider. Make sure you discuss any questions you have with your health care provider. Document Released: 11/12/2012 Document Revised: 06/27/2015 Document Reviewed: 05/14/2015 Elsevier Interactive  Patient Education  [employer].

## 2018-01-08 NOTE — Telephone Encounter (Signed)
Copied from [place] [id number]. Topic: Quick Communication - Rx Refill/Question >> Jan 08, 2018 10:42 AM Oneta Rack wrote: Medication: Oxycodone HCl 10 MG TABS   Has the patient contacted their pharmacy? yes   (Agent: If yes, when and what did the pharmacy advise?) contact PCP   Preferred Pharmacy (with phone number or street name):  [facility] [place], [place] [phone] (Phone) [phone] (Fax)   Agent: Please be advised that RX refills may take up to 3 business days. We ask that you follow-up with your pharmacy.

## 2018-01-08 NOTE — H&P (Signed)
Referring Physician(s): Eppie Gibson  Supervising Physician: Daryll Brod  Patient Status:  [facility] OP  Chief Complaint:  Dysphagia, tonsil cancer  Subjective: Patient familiar to IR service from prior hepatic abscess drain placement on 07/11/2016 with exchange on 07/20/2016 as well as right thoracentesis on 07/19/2016.  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  He has NG tube in place.  He continues to have some difficulty with swallowing and radiation therapy is planned soon. He  presents today for percutaneous gastrostomy tube placement.  He denies fever, headache, chest pain, worsening dyspnea, nominal pain, nausea, vomiting or bleeding.  He does have occasional cough, some increased oral secretions, intermittent back pain.  Past Medical History:  Diagnosis Date  . Aortic stenosis    mild echocardiogram 8/09 EF 60%, showed no regional wall motion abnml, mild LVH, mod focal basal septal hypertrophy and mild dyastolic dysfunction. 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 mean gradient across aortic valve was 8 mmHG. also mild L atrial enlargement and normal RV size and function. minimal AS on LHC in 7/10.   . Arthritis    "all over" (07/19/2016)  . BPH (benign prostatic hypertrophy)   . CAD (coronary artery disease)    LCH (7/10) totally occluded proximal RCA with very robuse L to R collaterals, 50% proximal LAD stenosis, EF 65%, medical management.    . Chicken pox   . Chronic lower back pain    s/p surgical fusion  . Diverticulosis   . Esophageal stricture   . GERD (gastroesophageal reflux disease)   . Heart murmur   . Hepatitis B 1984  . Hiatal hernia   . HLD (hyperlipidemia)   . HTN (hypertension)   . 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 Osteoarthritis   . TIA (transient ischemic attack) 1990s   hx  . tonsillar ca dx'd 11/2017  . Tubular adenoma of colon 2009   Past  Surgical History:  Procedure Laterality Date  . BACK SURGERY    . CATARACT EXTRACTION W/ INTRAOCULAR LENS  IMPLANT, BILATERAL Bilateral 01/2012 - 02/2012  . DIRECT LARYNGOSCOPY Left 10/09/2017   Procedure: DIRECT LARYNGOSCOPY WITH BOPSY;  Surgeon: Jodi Marble, MD;  Location: [place];  Service: ENT;  Laterality: Left;  . ESOPHAGOGASTRODUODENOSCOPY (EGD) WITH ESOPHAGEAL DILATION     "couple times" (07/19/2016)  . ESOPHAGOSCOPY Left 10/09/2017   Procedure: ESOPHAGOSCOPY;  Surgeon: Jodi Marble, MD;  Location: [place];  Service: ENT;  Laterality: Left;  . IR THORACENTESIS ASP PLEURAL SPACE W/IMG GUIDE  07/19/2016  . LAPAROSCOPIC CHOLECYSTECTOMY  1994  . LUMBAR DISC SURGERY  05/1996   L4-5; Dr. Sherwood Gambler   . LUMBAR LAMINECTOMY/DECOMPRESSION MICRODISCECTOMY  10/2002   L3-4. Dr. Sherwood Gambler  . MULTIPLE TOOTH EXTRACTIONS  1980s  . PARTIAL GLOSSECTOMY  12/02/2017   Dr. Nicolette Bang- [facility]  . pharyngoplasty for closure of tingue base defect  12/02/2017   Dr. Nicolette Bang- [facility]  . PICC LINE INSERTION  07/15/2016  . POSTERIOR LUMBAR FUSION  09/1996   Ray cage, L4-5 Dr. Rita Ohara  . PROSTATE BIOPSY  ~ 2017  . radical tonsillectomy Left 12/02/2017   Dr. Nicolette [facility] at [facility]  . RIGID BRONCHOSCOPY Left 10/09/2017   Procedure: RIGID BRONCHOSCOPY;  Surgeon: Jodi Marble, MD;  Location: [place];  Service: ENT;  Laterality: Left;  . TRACHEOSTOMY  12/02/2017   Dr. Nicolette Bang- [facility]     Allergies: Celebrex [celecoxib]  Medications: Prior to Admission medications   Medication Sig Start Date End Date Taking? Authorizing Provider  amLODipine (NORVASC) 10 MG tablet Take 1 tablet (10 mg total) by mouth daily. 07/12/17  Yes Larey Dresser, MD  aspirin 81 MG EC tablet Take 81 mg by mouth at bedtime.    Yes [provider]  isosorbide mononitrate (IMDUR) 30 MG 24 hr tablet TAKE 1 TABLET(30 MG) BY MOUTH AT BEDTIME 10/25/17  Yes Brunetta Jeans, PA-C  methocarbamol  (ROBAXIN) 500 MG tablet Take 1 tablet (500 mg total) by mouth 4 (four) times daily. 01/07/18  Yes Brunetta Jeans, PA-C  metoprolol tartrate (LOPRESSOR) 25 MG tablet Take 0.5 tablets (12.5 mg total) by mouth 2 (two) times daily. 10/23/17  Yes Larey Dresser, MD  Oxycodone HCl 10 MG TABS Take 1 tablet (10 mg total) by mouth every 6 (six) hours. 12/20/17  Yes Brunetta Jeans, PA-C  atorvastatin (LIPITOR) 40 MG tablet Take 1 tablet (40 mg total) by mouth daily. 05/31/17 11/11/17  Larey Dresser, MD  clonazePAM (KLONOPIN) 0.5 MG tablet TAKE 1 TABLET(0.5 MG) BY MOUTH TWICE DAILY AS NEEDED FOR ANXIETY Patient not taking: Reported on 12/27/2017 10/14/17   Brunetta Jeans, PA-C  lisinopril (PRINIVIL,ZESTRIL) 10 MG tablet Take 1 tablet (10 mg total) by mouth daily. 05/24/17 11/11/17  Larey Dresser, MD  pantoprazole (PROTONIX) 40 MG tablet Take 1 tablet (40 mg total) by mouth daily. Patient not taking: Reported on 12/27/2017 10/18/17   Brunetta Jeans, PA-C     Vital Signs: BP 129/75   Pulse 87   Temp 98.2 F (36.8 C) (Oral)   Resp 20   SpO2 99%   Physical Exam awake, alert.  NG tube and trach in place.  Chest clear to auscultation bilaterally anteriorly.  Heart with regular rate and rhythm, positive murmur.  Abdomen soft, positive bowel sounds, nontender.  No lower extremity edema.  Imaging: No results found.  Labs:  CBC: Recent Labs    02/13/17 1413 11/06/17 0929 12/22/17 0023 01/08/18 1247  WBC 7.5 6.6 8.6 5.9  HGB 16.1 14.3 10.3* 10.8*  HCT 47.1 42.3 33.1* 34.3*  PLT 128.0* 135* 358 154    COAGS: No results for input(s): INR, APTT in the last 8760 hours.  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  He has NG tube in place.  He continues to have some difficulty with swallowing and radiation therapy is planned soon. He  presents today for percutaneous gastrostomy tube placement.  Risks and benefits discussed with the patient including, but not limited to the need for a barium enema during  the procedure, bleeding, infection, peritonitis, or damage to adjacent structures.  All of the patient's questions were answered, patient is agreeable to proceed. Consent signed and in chart.  LABS PENDING   Electronically Signed: D. Rowe Robert, PA-C 01/08/2018, 1:07 PM   I spent a total of 25 minutes at the the patient's bedside AND on the patient's hospital floor or unit, greater than 50% of which was counseling/coordinating care for percutaneous gastrostomy tube placement

## 2018-01-08 NOTE — Procedures (Signed)
Head and neck ca  S/p fluoro Gtube No comp Stable ebl min Full use tomorrow

## 2018-01-08 NOTE — Progress Notes (Signed)
Oncology Nurse Navigator Documentation  Met with Mr. Neels and his wife prior to 12:00 PEG placement to provide PEG education.  Using  PEG teaching device   and Teach Back, provided education for PEG use and care, including: hand hygiene, gravity bolus administration of daily water flushes and nutritional supplement, fluids and medications; care of tube insertion site including daily dressing change and cleaning; S&S of infection.  Mr Rathert correctly verbalized dressing change and cleaning procedures, provided correct return demonstration of gravity administration of water and dressing change.  I provided written instructions for PEG flushing/dressing change in support of verbal instruction.  He understands I will be available for ongoing PEG support.  It was noted he is already receiving services including supplement from [place] for NG tube placed at [facility].  I provided them 2 boxes of drainage sponges as wife indicated supply near exhaustion.  I escorted them to [facility] for registration, encouraged them to call me with questions/concerns.  Gayleen Orem, RN, BSN Head & Neck Oncology Nurse [place] at [place] [phone]

## 2018-01-08 NOTE — Telephone Encounter (Signed)
Requested medication (s) are due for refill today: yes  Requested medication (s) are on the active medication list: yes  Last refill:  10/31/17 #60  Future visit scheduled: no  Notes to clinic:  Controlled substance   Requested Prescriptions  Pending Prescriptions Disp Refills   Oxycodone HCl 10 MG TABS 90 tablet 0    Sig: Take 1 tablet (10 mg total) by mouth every 6 (six) hours.     Not Delegated - Analgesics:  Opioid Agonists Failed - 01/08/2018 11:10 AM      Failed - This refill cannot be delegated      Failed - Urine Drug Screen completed in last 360 days.      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

## 2018-01-09 ENCOUNTER — Telehealth: Payer: Self-pay | Admitting: Emergency Medicine

## 2018-01-09 ENCOUNTER — Other Ambulatory Visit: Payer: Self-pay | Admitting: Physician Assistant

## 2018-01-09 MED ORDER — OXYCODONE HCL 10 MG PO TABS: 10.0000 mg | ORAL_TABLET | Freq: Four times a day (QID) | ORAL | 0 refills | Status: DC

## 2018-01-09 NOTE — Telephone Encounter (Signed)
Spoke with patient wife about medication refill. Oxycodone. He had feeding tube removed and now placed in stomach. Is having a lot of pain. Medication was refilled by PCP

## 2018-01-09 NOTE — Telephone Encounter (Signed)
Copied from [place] [id number]. Topic: Quick Communication - Rx Refill/Question >> Jan 09, 2018 10:21 AM Leward Quan A wrote: Medication: Oxycodone HCl 10 MG TABS  Per wife patient had surgery on 01/08/18 and is in need of pain meds. Has the patient contacted their pharmacy? Yes.   (Agent: If no, request that the patient contact the pharmacy for the refill.) (Agent: If yes, when and what did the pharmacy advise?)  Preferred Pharmacy (with phone number or street name): [facility], [place] [phone] (Phone) [phone] (Fax)    Agent: Please be advised that RX refills may take up to 3 business days. We ask that you follow-up with your pharmacy.

## 2018-01-09 NOTE — Telephone Encounter (Signed)
Request has been approved and sent to pharmacy, already.  TC to patient to inform the medication is at the pharmacy and to call back with questions/concerns.

## 2018-01-09 NOTE — Telephone Encounter (Signed)
Copied from [place] [id number]. Topic: General - Other >> Jan 09, 2018 10:24 AM Jodie Echevaria wrote: Reason for CRM: Patient wife request a call from Eduard Clos at Ph# [phone]

## 2018-01-10 ENCOUNTER — Telehealth: Payer: Self-pay | Admitting: *Deleted

## 2018-01-10 NOTE — Telephone Encounter (Addendum)
Oncology Nurse Navigator Documentation  Called Mr. Allende to check on his well-being s/p Wed's PEG placement, spoke with wife. She reported he is doing well, started PEG feedings yesterday. I reminder her of 8:00 arrival next Tuesday morning for H&N [facility].  She voiced understanding.  Gayleen Orem, RN, BSN Head & Neck Oncology Nurse [place] at [place] [phone]

## 2018-01-12 DIAGNOSIS — Z930 Tracheostomy status: Secondary | ICD-10-CM | POA: Diagnosis not present

## 2018-01-12 DIAGNOSIS — C099 Malignant neoplasm of tonsil, unspecified: Secondary | ICD-10-CM | POA: Diagnosis not present

## 2018-01-14 ENCOUNTER — Ambulatory Visit: Payer: Medicare Other | Attending: Radiation Oncology | Admitting: Physical Therapy

## 2018-01-14 ENCOUNTER — Encounter: Payer: Self-pay | Admitting: Physical Therapy

## 2018-01-14 ENCOUNTER — Inpatient Hospital Stay: Payer: Medicare Other | Attending: Hematology | Admitting: Nutrition

## 2018-01-14 ENCOUNTER — Encounter: Payer: Self-pay | Admitting: *Deleted

## 2018-01-14 ENCOUNTER — Other Ambulatory Visit: Payer: Self-pay

## 2018-01-14 ENCOUNTER — Ambulatory Visit
Admission: RE | Admit: 2018-01-14 | Discharge: 2018-01-14 | Disposition: A | Discharge status: Home / Self Care | Payer: Medicare Other | Source: Ambulatory Visit | Attending: Radiation Oncology | Admitting: Radiation Oncology

## 2018-01-14 VITALS — BP 141/83 | HR 92 | Temp 97.7°F | Resp 16 | Ht 68.5 in | Wt 160.0 lb

## 2018-01-14 DIAGNOSIS — R293 Abnormal posture: Secondary | ICD-10-CM | POA: Diagnosis not present

## 2018-01-14 DIAGNOSIS — G8929 Other chronic pain: Secondary | ICD-10-CM | POA: Insufficient documentation

## 2018-01-14 DIAGNOSIS — C090 Malignant neoplasm of tonsillar fossa: Secondary | ICD-10-CM | POA: Insufficient documentation

## 2018-01-14 DIAGNOSIS — Z1329 Encounter for screening for other suspected endocrine disorder: Secondary | ICD-10-CM | POA: Diagnosis not present

## 2018-01-14 DIAGNOSIS — Z79899 Other long term (current) drug therapy: Secondary | ICD-10-CM | POA: Insufficient documentation

## 2018-01-14 NOTE — Therapy (Signed)
[place], [place], [address] Phone: [phone]   Fax:  [phone]  Physical Therapy Evaluation  Patient Details  Name: Brandon [name] MRN: [id number] Date of Birth: [birth year] Referring Provider (PT): Dr Isidore Moos   Encounter Date: 01/14/2018  PT End of Session - 01/14/18 0918    Visit Number  1    Number of Visits  2   will see pt again after completion of radiation   Date for PT Re-Evaluation  03/08/18    PT Start Time  0840    PT Stop Time  0910    PT Time Calculation (min)  30 min    Activity Tolerance  Patient tolerated treatment well    Behavior During Therapy  [facility] for tasks assessed/performed       Past Medical History:  Diagnosis Date  . Aortic stenosis    mild echocardiogram 8/09 EF 60%, showed no regional wall motion abnml, mild LVH, mod focal basal septal hypertrophy and mild dyastolic dysfunction. 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 mean gradient across aortic valve was 8 mmHG. also mild L atrial enlargement and normal RV size and function. minimal AS on LHC in 7/10.   . Arthritis    "all over" (07/19/2016)  . BPH (benign prostatic hypertrophy)   . CAD (coronary artery disease)    LCH (7/10) totally occluded proximal RCA with very robuse L to R collaterals, 50% proximal LAD stenosis, EF 65%, medical management.    . Chicken pox   . Chronic lower back pain    s/p surgical fusion  . Diverticulosis   . Esophageal stricture   . GERD (gastroesophageal reflux disease)   . Heart murmur   . Hepatitis B 1984  . Hiatal hernia   . HLD (hyperlipidemia)   . HTN (hypertension)   . 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 Osteoarthritis   . TIA (transient ischemic attack) 1990s   hx  . tonsillar ca dx'd 11/2017  . Tubular adenoma of colon 2009    Past Surgical History:  Procedure Laterality Date  . BACK SURGERY    . CATARACT EXTRACTION W/ INTRAOCULAR  LENS  IMPLANT, BILATERAL Bilateral 01/2012 - 02/2012  . DIRECT LARYNGOSCOPY Left 10/09/2017   Procedure: DIRECT LARYNGOSCOPY WITH BOPSY;  Surgeon: Jodi Marble, MD;  Location: [facility];  Service: ENT;  Laterality: Left;  . ESOPHAGOGASTRODUODENOSCOPY (EGD) WITH ESOPHAGEAL DILATION     "couple times" (07/19/2016)  . ESOPHAGOSCOPY Left 10/09/2017   Procedure: ESOPHAGOSCOPY;  Surgeon: Jodi Marble, MD;  Location: [facility];  Service: ENT;  Laterality: Left;  . IR GASTROSTOMY TUBE MOD SED  01/08/2018  . IR THORACENTESIS ASP PLEURAL SPACE W/IMG GUIDE  07/19/2016  . LAPAROSCOPIC CHOLECYSTECTOMY  1994  . LUMBAR DISC SURGERY  05/1996   L4-5; Dr. Sherwood Gambler   . LUMBAR LAMINECTOMY/DECOMPRESSION MICRODISCECTOMY  10/2002   L3-4. Dr. Sherwood Gambler  . MULTIPLE TOOTH EXTRACTIONS  1980s  . PARTIAL GLOSSECTOMY  12/02/2017   Dr. Nicolette Bang- [facility]  . pharyngoplasty for closure of tingue base defect  12/02/2017   Dr. Nicolette Bang- [facility]  . PICC LINE INSERTION  07/15/2016  . POSTERIOR LUMBAR FUSION  09/1996   Ray cage, L4-5 Dr. Rita Ohara  . PROSTATE BIOPSY  ~ 2017  . radical tonsillectomy Left 12/02/2017   Dr. Nicolette Bang at [facility]  . RIGID BRONCHOSCOPY Left 10/09/2017   Procedure: RIGID BRONCHOSCOPY;  Surgeon: Jodi Marble, MD;  Location: [facility];  Service: ENT;  Laterality: Left;  . TRACHEOSTOMY  12/02/2017   Dr. Nicolette [facility]- [facility]    There were no vitals filed for this visit.   Subjective Assessment - 01/14/18 0914    Subjective  It hurts when I lift this arm up because they had to move a muscle in my shoulder.     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  Yes    Pain Score  5     Pain  Location  Abdomen   recently placed PEG   Pain Orientation  Left    Pain Descriptors / Indicators  Sharp    Pain Type  Surgical pain    Pain Onset  In the past 7 days    Pain Frequency  Intermittent    Aggravating Factors   moving around    Pain Relieving Factors  being still     Effect of Pain on Daily Activities  hurts to move         [facility] PT Assessment - 01/14/18 0001      Assessment   Medical Diagnosis  L tonsil squamous cell carcinoma    Referring Provider (PT)  Dr Isidore Moos    Onset Date/Surgical Date  12/02/17    Hand Dominance  Right    Prior Therapy  none      Precautions   Precautions  Other (comment)    Precaution Comments  active cancer      Restrictions   Weight Bearing Restrictions  No      Balance Screen   Has the patient fallen in the past 6 months  No    Has the patient had a decrease in activity level because of a fear of falling?   No    Is the patient reluctant to leave their home because of a fear of falling?   No      Home Film/video editor residence    Living Arrangements  Spouse/significant other    Available Help at Discharge  Family    Type of [place] to enter    Entrance Stairs-Number of Steps  2    Entrance Stairs-Rails  Right    [place]  Two level;Able to live on main level with bedroom/bathroom      Prior Function   Level of Independence  Independent    Vocation  Retired    Leisure  does not formally exercise      Cognition   Overall Cognitive Status  Within Functional Limits for tasks assessed      Functional Tests   Functional tests  Sit to Stand      Sit to Stand   Comments  30 sec sit to stand: 10 reps which is below average for his age      Posture/Postural Control   Posture/Postural Control  Postural limitations    Postural Limitations  Rounded Shoulders;Forward head      ROM / Strength   AROM / PROM / Strength  AROM      AROM   Overall AROM   Within functional  limits for tasks  performed    Overall AROM Comments  shoulder ROM WFL, jaw ROM limited opening    Cervical Flexion  25% limited    Cervical Extension  50% limited    Cervical - Right Side Bend  25% limited    Cervical - Left Side Bend  25% l imited    Cervical - Right Rotation  75% limited    Cervical - Left Rotation  50% limited      Ambulation/Gait   Ambulation/Gait  Yes    Ambulation Distance (Feet)  15 Feet    Gait Pattern  Within Functional Limits        LYMPHEDEMA/ONCOLOGY QUESTIONNAIRE - 01/14/18 1041      Type   Cancer Type  left tonsil SCC      Lymphedema Assessments   Lymphedema Assessments  Head and Neck   not assessed today due to trach            Objective measurements completed on examination: See above findings.              PT Education - 01/14/18 0917    Education Details  Neck ROM, posture, breathing, walking, CURE article on staying active, "Why exercise?" flyer, lymphedema and PT info    Person(s) Educated  Patient;Spouse    Methods  Explanation;Demonstration;Handout    Comprehension  Verbalized understanding;Returned demonstration          PT Long Term Goals - 01/14/18 1043      PT LONG TERM GOAL #1   Title  Pt will present with full cervical ROM to allow pt to return to prior level of function     Time  4    Period  Weeks    Status  New    Target Date  03/08/18         Head and Neck Clinic Goals - 01/14/18 1043      Patient will be able to verbalize understanding of a home exercise program for cervical range of motion, posture, and walking.    Status  Achieved      Patient will be able to verbalize understanding of proper sitting and standing posture.    Status  Achieved      Patient will be able to verbalize understanding of lymphedema risk and availability of treatment for this condition.    Status  Achieved         Plan - 01/14/18 1045    Clinical Impression Statement  Pt presents to head  and neck clinic with recently diagnosed L tonsil squamous cell carcinoma. He underwent trans oral robotic surgery on 12/02/17 and will begin radiation on 01/16/18. He currently has limited cervical ROM and limited jaw ROM. Issued pt information today regarding lymphedema, ROM exercises for neck and importance of exercise. Pt would benefit from a follow up visit after he completes radiation to reassess jaw and cervical ROM and treat if still limited and also assess for swelling. He currently also has increased left shoulder pain following surgery.    History and Personal Factors relevant to plan of care:  pt to begin radiation, hx of 3 back surgeries and DDD    Clinical Presentation  Evolving    Clinical Presentation due to:  pt about to start treatments    Clinical Decision Making  Moderate    Rehab Potential  Good    Clinical Impairments Affecting Rehab Potential  pt beginning radiation    PT Frequency  [place]    PT Duration  4 weeks    PT Treatment/Interventions  ADLs/Self Care Home Management;Patient/family education;Therapeutic exercise;Scar mobilization    PT Next Visit Plan  reassess cervical and jaw ROM, see if pt needs therapy, assess shoulder pain and circumference of neck- add goals as needed    PT Home Exercise Plan  head and neck ROM exercises, walking program    Consulted and Agree with Plan of Care  Patient       Patient will benefit from skilled therapeutic intervention in order to improve the following deficits and impairments:  Pain, Increased fascial restricitons, Decreased range of motion, Postural dysfunction  Visit Diagnosis: Abnormal posture - Plan: PT plan of care cert/re-cert     Problem List Patient Active Problem List   Diagnosis Date Noted  . Cancer of tonsillar fossa ([place]) 09/20/2017  . Bruising 10/02/2016  . 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 Aneurysm of infrarenal abdominal aorta (HCC) 07/10/2016  . Normocytic anemia 07/10/2016  . Tobacco abuse disorder  01/10/2014  . Seasonal allergies 11/23/2013  . Essential hypertension 05/19/2013  . Osteoarthritis 05/29/2009  . 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 Aortic valve disorder 01/28/2008  . BRAIN STEM STROKE 08/27/2007  . 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 BENIGN PROSTATIC HYPERTROPHY, HX OF 08/27/2007  . Hyperlipidemia 03/19/2007  . Anxiety state 03/19/2007  . GERD 03/19/2007    Allyson Sabal [facility] 01/14/2018, 10:52 AM  [place], [place], [address] Phone: [phone]   Fax:  [phone]  Name: Brandon [name] MRN: [id number] Date of Birth: 1940/08/26  Manus Gunning, PT 01/14/18 10:52 AM

## 2018-01-14 NOTE — Progress Notes (Signed)
Clinical Social Work English as a second language teacher [place]   Clinical Social Worker met with patient and caregiver in [facility] to offer support and assess for needs.  CSW educated patient on CSW role and resources provided by the [facility] support team.  CSW and patient discussed common feelings and emotions when going through treatment and the importance of support.  Patient did not express any social work needs at this time.  CSW provided contact information for the CSW team and encouraged patient to call with questions or concerns.     Johnnye Lana, MSW, LCSW, OSW-C Clinical Social Worker [facility] [phone]

## 2018-01-14 NOTE — Progress Notes (Signed)
Patient was seen during head and neck clinic  Patient is a 77 year old male diagnosed with left tonsil cancer, stage I, P 16+. Patient had TORS on October 28 at [facility].  He received a trach and an NG tube. Patient recently had his NG tube removed and a PEG placed on December 4 at [facility]. Patient is receiving adjuvant radiation therapy, 33 fractions to the tumor bed plus or minus regional neck nodes beginning December 12.  Past medical history includes TIA, hypertension, hyperlipidemia, hepatitis B, GERD, esophageal stricture, diverticulosis, and CAD.  Medications include Lipitor, Klonopin, and Protonix.  Labs were reviewed.  Height: 5 feet 8.5 inches. Weight: 160 pounds. Usual body weight: 184 pounds on August 19. BMI: 23.97.  Estimated nutrition needs: 2050- 2250 cal, 90-105 g protein, 2.2 L fluid.  Patient was seen by a dietitian at [facility] who has ordered 6 bottles of Osmolite 1.5 daily. Patient has been educated to give 1 bottle 6 times a day. He reports he is unable to tolerate 6 bottles daily. Patient also uses a protein supplement but he cannot tell me which one he uses. He complains of constipation.  Last bowel movement was a day and a half ago. He has thick sputum which is bothersome to him Reports he does not really eat food by mouth but does try to drink water.  Nutrition diagnosis:  Unintended weight loss related to tonsil cancer and associated treatments as evidenced by 13% weight loss over 6 months.  Intervention: I educated patient to change Osmolite 1.5 to 1 and 1/2 bottles 4 times daily with 60 mL free water before and after bolus feeding.  Educated patient on the importance of giving tube feeding over 15 minutes.  Patient demonstrated ability to tolerate a bottle and a half of Osmolite 1.5 with free water flushes while in exam room today. Educated patient to increase free water flushes by 240 mL free  water 3 times daily between bolus feedings. Patient educated to get MiraLAX twice daily and Dulcolax once daily per RN.  6 bottles Osmolite 1.5 provides 2130 cal, 89.4 g protein, and 2286 mL free water.  This does not include protein supplement because I do not know which protein supplement patient is using.  Monitoring, evaluation, goals: Patient will tolerate tube feeding at goal rate to meet greater than 90% estimated nutrition needs and minimize weight loss throughout treatment.  Next visit: To be scheduled weekly as needed.  **Disclaimer: This note was dictated with voice recognition software. Similar sounding words can inadvertently be transcribed and this note may contain transcription errors which may not have been corrected upon publication of note.**

## 2018-01-15 ENCOUNTER — Telehealth: Payer: Self-pay | Admitting: *Deleted

## 2018-01-15 DIAGNOSIS — Z930 Tracheostomy status: Secondary | ICD-10-CM | POA: Diagnosis not present

## 2018-01-15 DIAGNOSIS — R1312 Dysphagia, oropharyngeal phase: Secondary | ICD-10-CM | POA: Diagnosis not present

## 2018-01-15 DIAGNOSIS — C108 Malignant neoplasm of overlapping sites of oropharynx: Secondary | ICD-10-CM | POA: Diagnosis not present

## 2018-01-15 DIAGNOSIS — Z483 Aftercare following surgery for neoplasm: Secondary | ICD-10-CM | POA: Diagnosis not present

## 2018-01-15 NOTE — Telephone Encounter (Signed)
Oncology Nurse Navigator Documentation  Rec'd call from patient's wife indicating per his appt with Dr. Nicolette Bang, [facility], he is cleared to start RT tomorrow.  Dr. Isidore Moos informed.  Gayleen Orem, RN, BSN Head & Neck Oncology Nurse [place] at [place] [phone]

## 2018-01-16 ENCOUNTER — Encounter: Payer: Self-pay | Admitting: *Deleted

## 2018-01-16 ENCOUNTER — Encounter: Payer: Self-pay | Admitting: Nutrition

## 2018-01-16 ENCOUNTER — Ambulatory Visit
Admission: RE | Admit: 2018-01-16 | Discharge: 2018-01-16 | Disposition: A | Discharge status: Home / Self Care | Payer: Medicare Other | Source: Ambulatory Visit | Attending: Radiation Oncology | Admitting: Radiation Oncology

## 2018-01-16 DIAGNOSIS — C090 Malignant neoplasm of tonsillar fossa: Secondary | ICD-10-CM | POA: Diagnosis not present

## 2018-01-16 DIAGNOSIS — G8929 Other chronic pain: Secondary | ICD-10-CM | POA: Diagnosis not present

## 2018-01-16 DIAGNOSIS — Z79899 Other long term (current) drug therapy: Secondary | ICD-10-CM | POA: Diagnosis not present

## 2018-01-16 DIAGNOSIS — Z1329 Encounter for screening for other suspected endocrine disorder: Secondary | ICD-10-CM | POA: Diagnosis not present

## 2018-01-16 MED ORDER — SONAFINE EX EMUL: 1.0000 "application " | Freq: Once | CUTANEOUS | Status: DC

## 2018-01-16 NOTE — Progress Notes (Signed)
Oncology Nurse Navigator Documentation  To provide support, encouragement and care continuity, met with Brandon [name] for his initial  RT.  He was accompanied by his wife.    I reviewed the 2-step treatment process, answered questions.   His wife observed the treatment, expressed appreciation for the learning opportunity.  [name] completed treatment without difficulty, denied questions/concerns.  He arrived with Yankauer suction machine, used it before/after RT.  I escorted him to Nursing for post-Sim Ed with RN Anderson [place] Mal[place]elt. I encouraged them to call me with questions/concerns as tmts proceed.  Gayleen Orem, RN, BSN Head & Neck Oncology Nurse [place] at [place] [phone]

## 2018-01-16 NOTE — Progress Notes (Signed)
Oncology Nurse Navigator Documentation  Met with Brandon [name] upon his arrival for H&N [place].  He was accompanied by his wife.  Provided verbal and written overview of [place], the clinicians who will be seeing him, encouraged him to ask questions during his time with them.  He was seen by Nutrition, PT, and SW.  He was later contacted by [place] with them end of [place], addressed questions.  At his request, I evaluated PEG site.  It appeared WNL.  Changed dressing. I encouraged them to call me with needs/concerns.  Gayleen Orem, RN, BSN Head & Neck Oncology [place] at [place] [phone]

## 2018-01-16 NOTE — Progress Notes (Signed)
Pt here for patient teaching.  Pt given Radiation and You booklet, Managing Acute Radiation Side Effects for Head and Neck Cancer handout, skin care instructions and Sonafine.  Reviewed areas of pertinence such as fatigue, hair loss, mouth changes, skin changes, throat changes and taste changes . Pt able to give teach back of to pat skin, use unscented/gentle soap and drink plenty of water,apply Sonafine bid, avoid applying anything to skin within 4 hours of treatment and to use an electric razor if they must shave. Pt verbalizes understanding of information given and will contact nursing with any questions or concerns.     Http://rtanswers.org/treatmentinformation/whattoexpect/index  Managing Acute Radiation Side Effects for Head and Neck Cancer  Skin irritation:  . Sonafine  Topical Emulsion: First-line topical cream to help soothe skin irritation.  Apply to skin in radiation fields at least 4 hours before radiotherapy, or any time after treatments during the rest of the day.  . Triple Antibiotic Ointment (Neosporin): Apply to areas of skin with moist breakdown to prevent infection.  . 1% hydrocortisone cream: Apply to areas of skin that are itching, up to three times a day.  . Silvadene (Silver Sulfadiazine): Used in select cases if large patches of skin develop moist breakdown (let physician or nurse know if you have a "sulfa" drug allergy)  Soreness in mouth or throat: . Baking Soda Rinse: a home remedy to soothe/cleanse mouth and loosen thick saliva.  Mix 1/2 teaspoon salt, 1/2 teaspoon baking soda, 1 pint water (16 oz or two cups).  Swish, gargle and spit as needed to soothe/cleanse mouth. Use as often as you want.  . Sucralfate (Carafate): coats throat to soothe it before meals or any time of day. Crush 1 tablet in 10 mL H20 and swallow up to four times a day.  . 2% viscous Lidocaine (Magic Mouth Wash): Soothes mouth and/or throat by numbing your mucous membranes. Mix 1 part 2% viscous  lidocaine (Magic Mouth Wash), 1 part H20. Swish and/or swallow 10mL of this mixture, 30min before meals and at bedtime, up to four times a day. Alternate with Sucralfate (Carafate).  . Narcotics: Various short acting and long acting narcotics can be prescribed.  Often, medical oncology will prescribe these if you are receiving chemotherapy concurrently. Narcotics may cause constipation. It may be helpful to take a stool softener (Docusate Sodium) or gentle laxative (ie Senna or Polyethylene Glycol) to prevent constipation.  Having food in your stomach before ingesting a narcotic may reduce risk of stomach upset.  Thick Saliva: . Baking Soda Rinse: a home remedy to soothe/cleanse mouth and loosen thick saliva.  Mix 1/2 teaspoon salt, 1/2 teaspoon baking soda, 1 pint water (16 oz or two cups).  Swish, gargle, and spit as needed to soothe/cleanse mouth. Use as often as you want.  . Some patients find Diet Ginger Ale or Papaya Juice to be helpful.  . In extreme cases, your physician may consider prescribing a Scopolamine transdermal patch which dries up your saliva.     Poor taste, or lack of taste:   . There are no well-established medications to combat taste bud changes from radiotherapy.  It often takes weeks to months to regain taste function.  Eating bland foods and drinking nutritional shakes  may help you maintain your weight when food is not enjoyable.  Some patients supplement their oral intake with a feeding tube.  Fatigue and weakness: . There is not a well-established safe and effective medication to combat radiation-induced fatigue.  However,   if you are able to perform light exercise (such as a daily walk, yoga, recumbent stationary bicycling), this may combat fatigue and help you maintain muscle mass during treatment.  . Maintaining hydration and nutrition are also important.  If you have not been referred to a nutritionist and would like a referral, please let your nurse or physician  know.  . Try to get at least 8 hours of sleep each night. You may need a daily nap, but try not to nap so late that it interferes with your nightly sleep schedule.       

## 2018-01-17 ENCOUNTER — Ambulatory Visit
Admission: RE | Admit: 2018-01-17 | Discharge: 2018-01-17 | Disposition: A | Discharge status: Home / Self Care | Payer: Medicare Other | Source: Ambulatory Visit | Attending: Radiation Oncology | Admitting: Radiation Oncology

## 2018-01-17 DIAGNOSIS — C090 Malignant neoplasm of tonsillar fossa: Secondary | ICD-10-CM | POA: Diagnosis not present

## 2018-01-17 DIAGNOSIS — Z79899 Other long term (current) drug therapy: Secondary | ICD-10-CM | POA: Diagnosis not present

## 2018-01-17 DIAGNOSIS — G8929 Other chronic pain: Secondary | ICD-10-CM | POA: Diagnosis not present

## 2018-01-17 DIAGNOSIS — Z1329 Encounter for screening for other suspected endocrine disorder: Secondary | ICD-10-CM | POA: Diagnosis not present

## 2018-01-19 DIAGNOSIS — Z930 Tracheostomy status: Secondary | ICD-10-CM | POA: Diagnosis not present

## 2018-01-19 DIAGNOSIS — C099 Malignant neoplasm of tonsil, unspecified: Secondary | ICD-10-CM | POA: Diagnosis not present

## 2018-01-20 ENCOUNTER — Inpatient Hospital Stay: Payer: Medicare Other | Admitting: Nutrition

## 2018-01-20 ENCOUNTER — Other Ambulatory Visit: Payer: Self-pay | Admitting: Radiation Oncology

## 2018-01-20 ENCOUNTER — Encounter: Payer: Self-pay | Admitting: Nutrition

## 2018-01-20 ENCOUNTER — Ambulatory Visit
Admission: RE | Admit: 2018-01-20 | Discharge: 2018-01-20 | Disposition: A | Discharge status: Home / Self Care | Payer: Medicare Other | Source: Ambulatory Visit | Attending: Radiation Oncology | Admitting: Radiation Oncology

## 2018-01-20 DIAGNOSIS — G8929 Other chronic pain: Secondary | ICD-10-CM | POA: Diagnosis not present

## 2018-01-20 DIAGNOSIS — C090 Malignant neoplasm of tonsillar fossa: Secondary | ICD-10-CM

## 2018-01-20 DIAGNOSIS — Z1329 Encounter for screening for other suspected endocrine disorder: Secondary | ICD-10-CM | POA: Diagnosis not present

## 2018-01-20 DIAGNOSIS — Z79899 Other long term (current) drug therapy: Secondary | ICD-10-CM | POA: Diagnosis not present

## 2018-01-20 MED ORDER — SCOPOLAMINE 1 MG/3DAYS TD PT72: 1.0000 | MEDICATED_PATCH | TRANSDERMAL | 2 refills | Status: DC

## 2018-01-20 NOTE — Progress Notes (Signed)
Patient did not show up for nutrition appointment. 

## 2018-01-21 ENCOUNTER — Ambulatory Visit
Admission: RE | Admit: 2018-01-21 | Discharge: 2018-01-21 | Disposition: A | Discharge status: Home / Self Care | Payer: Medicare Other | Source: Ambulatory Visit | Attending: Radiation Oncology | Admitting: Radiation Oncology

## 2018-01-21 DIAGNOSIS — G8929 Other chronic pain: Secondary | ICD-10-CM | POA: Diagnosis not present

## 2018-01-21 DIAGNOSIS — Z79899 Other long term (current) drug therapy: Secondary | ICD-10-CM | POA: Diagnosis not present

## 2018-01-21 DIAGNOSIS — C099 Malignant neoplasm of tonsil, unspecified: Secondary | ICD-10-CM | POA: Diagnosis not present

## 2018-01-21 DIAGNOSIS — C090 Malignant neoplasm of tonsillar fossa: Secondary | ICD-10-CM | POA: Diagnosis not present

## 2018-01-21 DIAGNOSIS — Z930 Tracheostomy status: Secondary | ICD-10-CM | POA: Diagnosis not present

## 2018-01-21 DIAGNOSIS — Z1329 Encounter for screening for other suspected endocrine disorder: Secondary | ICD-10-CM | POA: Diagnosis not present

## 2018-01-22 ENCOUNTER — Ambulatory Visit
Admission: RE | Admit: 2018-01-22 | Discharge: 2018-01-22 | Disposition: A | Discharge status: Home / Self Care | Payer: Medicare Other | Source: Ambulatory Visit | Attending: Radiation Oncology | Admitting: Radiation Oncology

## 2018-01-22 DIAGNOSIS — Z1329 Encounter for screening for other suspected endocrine disorder: Secondary | ICD-10-CM | POA: Diagnosis not present

## 2018-01-22 DIAGNOSIS — G8929 Other chronic pain: Secondary | ICD-10-CM | POA: Diagnosis not present

## 2018-01-22 DIAGNOSIS — Z79899 Other long term (current) drug therapy: Secondary | ICD-10-CM | POA: Diagnosis not present

## 2018-01-22 DIAGNOSIS — C090 Malignant neoplasm of tonsillar fossa: Secondary | ICD-10-CM | POA: Diagnosis not present

## 2018-01-23 ENCOUNTER — Encounter: Payer: Self-pay | Admitting: *Deleted

## 2018-01-23 ENCOUNTER — Ambulatory Visit
Admission: RE | Admit: 2018-01-23 | Discharge: 2018-01-23 | Disposition: A | Discharge status: Home / Self Care | Payer: Medicare Other | Source: Ambulatory Visit | Attending: Radiation Oncology | Admitting: Radiation Oncology

## 2018-01-23 ENCOUNTER — Telehealth: Payer: Self-pay | Admitting: Physician Assistant

## 2018-01-23 DIAGNOSIS — G8929 Other chronic pain: Secondary | ICD-10-CM | POA: Diagnosis not present

## 2018-01-23 DIAGNOSIS — Z79899 Other long term (current) drug therapy: Secondary | ICD-10-CM | POA: Diagnosis not present

## 2018-01-23 DIAGNOSIS — C090 Malignant neoplasm of tonsillar fossa: Secondary | ICD-10-CM | POA: Diagnosis not present

## 2018-01-23 DIAGNOSIS — Z1329 Encounter for screening for other suspected endocrine disorder: Secondary | ICD-10-CM | POA: Diagnosis not present

## 2018-01-23 NOTE — Telephone Encounter (Signed)
Ok for verbal order  °

## 2018-01-23 NOTE — Telephone Encounter (Signed)
Copied from [place] [id number]. Topic: Quick Communication - Home Health Verbal Orders >> Jan 23, 2018  8:45 AM Berneta Levins wrote: Caller/Agency: Donita with [place] Number: [phone], OK to leave a message Requesting OT/PT/Skilled Nursing/Social Work: nursing Order: barrier cream to open area on his coccyx to be applied twice a day

## 2018-01-23 NOTE — Telephone Encounter (Signed)
Verbal order has been given to [place].

## 2018-01-23 NOTE — Progress Notes (Signed)
[place] Work  Clinical Social Work was referred by Pension scheme manager for anxiety/depression.  Clinical Social Worker contacted patient by phoneto offer support and assess for needs.  [name] reported many physical symptoms that are causing him frustration and discomfort.  He stated he "gets in a mood" and described feelings of hopelessness.  He reported when he feels this way, he likes to talk to his spouse about what he's feeling.  CSW validated and normalized patients feelings of hopelessness and feeling generally fatigued through treatment. [name] reported he was on antidepressants in the past when dealing with similar emotions, but stated he is not taking anything at this time. CSW discussed sharing these concerns with his PCP to determine if antidepressants may be helpful.  Patient reported radiation oncologist is managing medications and communicating with PCP at this time.  CSW will send message to radiation oncologist to address appropriate provider for medication management.  CSW will follow up with patient next week to provide brief emotional support and answer any questions.      Gwinda Maine, LCSW  Clinical Social Worker [facility]

## 2018-01-24 ENCOUNTER — Ambulatory Visit
Admission: RE | Admit: 2018-01-24 | Discharge: 2018-01-24 | Disposition: A | Discharge status: Home / Self Care | Payer: Medicare Other | Source: Ambulatory Visit | Attending: Radiation Oncology | Admitting: Radiation Oncology

## 2018-01-24 DIAGNOSIS — Z79899 Other long term (current) drug therapy: Secondary | ICD-10-CM | POA: Diagnosis not present

## 2018-01-24 DIAGNOSIS — C090 Malignant neoplasm of tonsillar fossa: Secondary | ICD-10-CM | POA: Diagnosis not present

## 2018-01-24 DIAGNOSIS — G8929 Other chronic pain: Secondary | ICD-10-CM | POA: Diagnosis not present

## 2018-01-24 DIAGNOSIS — Z1329 Encounter for screening for other suspected endocrine disorder: Secondary | ICD-10-CM | POA: Diagnosis not present

## 2018-01-27 ENCOUNTER — Other Ambulatory Visit: Payer: Self-pay | Admitting: Radiation Oncology

## 2018-01-27 ENCOUNTER — Ambulatory Visit
Admission: RE | Admit: 2018-01-27 | Discharge: 2018-01-27 | Disposition: A | Discharge status: Home / Self Care | Payer: Medicare Other | Source: Ambulatory Visit | Attending: Radiation Oncology | Admitting: Radiation Oncology

## 2018-01-27 DIAGNOSIS — G8929 Other chronic pain: Secondary | ICD-10-CM | POA: Diagnosis not present

## 2018-01-27 DIAGNOSIS — Z79899 Other long term (current) drug therapy: Secondary | ICD-10-CM | POA: Diagnosis not present

## 2018-01-27 DIAGNOSIS — C090 Malignant neoplasm of tonsillar fossa: Secondary | ICD-10-CM

## 2018-01-27 DIAGNOSIS — Z1329 Encounter for screening for other suspected endocrine disorder: Secondary | ICD-10-CM | POA: Diagnosis not present

## 2018-01-27 MED ORDER — OXYCODONE HCL 10 MG PO TABS: ORAL_TABLET | ORAL | 0 refills | Status: DC

## 2018-01-28 ENCOUNTER — Encounter: Payer: Self-pay | Admitting: Nutrition

## 2018-01-28 ENCOUNTER — Ambulatory Visit
Admission: RE | Admit: 2018-01-28 | Discharge: 2018-01-28 | Disposition: A | Discharge status: Home / Self Care | Payer: Medicare Other | Source: Ambulatory Visit | Attending: Radiation Oncology | Admitting: Radiation Oncology

## 2018-01-28 ENCOUNTER — Inpatient Hospital Stay: Payer: Medicare Other | Admitting: Nutrition

## 2018-01-28 DIAGNOSIS — Z1329 Encounter for screening for other suspected endocrine disorder: Secondary | ICD-10-CM | POA: Diagnosis not present

## 2018-01-28 DIAGNOSIS — Z79899 Other long term (current) drug therapy: Secondary | ICD-10-CM | POA: Diagnosis not present

## 2018-01-28 DIAGNOSIS — C090 Malignant neoplasm of tonsillar fossa: Secondary | ICD-10-CM | POA: Diagnosis not present

## 2018-01-28 DIAGNOSIS — G8929 Other chronic pain: Secondary | ICD-10-CM | POA: Diagnosis not present

## 2018-01-28 NOTE — Progress Notes (Signed)
Patient did not show up for nutrition appointment. 

## 2018-01-30 ENCOUNTER — Ambulatory Visit
Admission: RE | Admit: 2018-01-30 | Discharge: 2018-01-30 | Disposition: A | Discharge status: Home / Self Care | Payer: Medicare Other | Source: Ambulatory Visit | Attending: Radiation Oncology | Admitting: Radiation Oncology

## 2018-01-30 ENCOUNTER — Telehealth: Payer: Self-pay | Admitting: *Deleted

## 2018-01-30 DIAGNOSIS — Z79899 Other long term (current) drug therapy: Secondary | ICD-10-CM | POA: Diagnosis not present

## 2018-01-30 DIAGNOSIS — G8929 Other chronic pain: Secondary | ICD-10-CM | POA: Diagnosis not present

## 2018-01-30 DIAGNOSIS — Z1329 Encounter for screening for other suspected endocrine disorder: Secondary | ICD-10-CM | POA: Diagnosis not present

## 2018-01-30 DIAGNOSIS — C090 Malignant neoplasm of tonsillar fossa: Secondary | ICD-10-CM | POA: Diagnosis not present

## 2018-01-30 NOTE — Telephone Encounter (Signed)
CALLED [facility] TO ARRANGE THIS APPT. FOR THIS PATIENT, SPOKE WITH RECEPTIONIST AND SHE HAS CALLED THEM AND THEY ARE SUPPOSE TO CALL HER BACK TODAY

## 2018-01-31 ENCOUNTER — Ambulatory Visit
Admission: RE | Admit: 2018-01-31 | Discharge: 2018-01-31 | Disposition: A | Discharge status: Home / Self Care | Payer: Medicare Other | Source: Ambulatory Visit | Attending: Radiation Oncology | Admitting: Radiation Oncology

## 2018-01-31 DIAGNOSIS — G8929 Other chronic pain: Secondary | ICD-10-CM | POA: Diagnosis not present

## 2018-01-31 DIAGNOSIS — Z79899 Other long term (current) drug therapy: Secondary | ICD-10-CM | POA: Diagnosis not present

## 2018-01-31 DIAGNOSIS — Z1329 Encounter for screening for other suspected endocrine disorder: Secondary | ICD-10-CM | POA: Diagnosis not present

## 2018-01-31 DIAGNOSIS — C090 Malignant neoplasm of tonsillar fossa: Secondary | ICD-10-CM | POA: Diagnosis not present

## 2018-02-03 ENCOUNTER — Ambulatory Visit
Admission: RE | Admit: 2018-02-03 | Discharge: 2018-02-03 | Disposition: A | Discharge status: Home / Self Care | Payer: Medicare Other | Source: Ambulatory Visit | Attending: Radiation Oncology | Admitting: Radiation Oncology

## 2018-02-03 ENCOUNTER — Encounter: Payer: Self-pay | Admitting: *Deleted

## 2018-02-03 DIAGNOSIS — G8929 Other chronic pain: Secondary | ICD-10-CM | POA: Diagnosis not present

## 2018-02-03 DIAGNOSIS — Z79899 Other long term (current) drug therapy: Secondary | ICD-10-CM | POA: Diagnosis not present

## 2018-02-03 DIAGNOSIS — C090 Malignant neoplasm of tonsillar fossa: Secondary | ICD-10-CM | POA: Diagnosis not present

## 2018-02-03 DIAGNOSIS — Z1329 Encounter for screening for other suspected endocrine disorder: Secondary | ICD-10-CM | POA: Diagnosis not present

## 2018-02-04 ENCOUNTER — Ambulatory Visit
Admission: RE | Admit: 2018-02-04 | Discharge: 2018-02-04 | Disposition: A | Discharge status: Home / Self Care | Payer: Medicare Other | Source: Ambulatory Visit | Attending: Radiation Oncology | Admitting: Radiation Oncology

## 2018-02-04 DIAGNOSIS — G8929 Other chronic pain: Secondary | ICD-10-CM | POA: Diagnosis not present

## 2018-02-04 DIAGNOSIS — C090 Malignant neoplasm of tonsillar fossa: Secondary | ICD-10-CM | POA: Diagnosis not present

## 2018-02-04 DIAGNOSIS — Z79899 Other long term (current) drug therapy: Secondary | ICD-10-CM | POA: Diagnosis not present

## 2018-02-04 DIAGNOSIS — Z1329 Encounter for screening for other suspected endocrine disorder: Secondary | ICD-10-CM | POA: Diagnosis not present

## 2018-02-04 NOTE — Progress Notes (Signed)
Oncology Nurse Navigator Documentation  In follow-up to Mr. Devonshire call this morning and expressed desire to meet with me, met with him and his wife s/p today's RT.  He stated he wanted to understand "why my tongue was sewed to the back of my mouth" (10/28 surgery with Dr. Nicolette Bang, [facility]), expressed concern re recovery of tongue function.  I explained questions better answered by Dr. Nicolette Bang, encouraged him to call him.  I acknowledged his concerns, assured him tongue function will improve with time but unlikely to pre-surgical baseline.  He acknowledged.  I encouraged him to conduct HEP as directed by SLP Garald Balding to optimize swallowing function during tmts as well as afterwards, assured him he will benefit if compliant.  He acknowledged. I encouraged them to call me with future needs/conerns.  Gayleen Orem, RN, BSN Head & Neck Oncology Nurse [place] at [place] [phone]

## 2018-02-05 DIAGNOSIS — C099 Malignant neoplasm of tonsil, unspecified: Secondary | ICD-10-CM | POA: Diagnosis not present

## 2018-02-05 DIAGNOSIS — Z930 Tracheostomy status: Secondary | ICD-10-CM | POA: Diagnosis not present

## 2018-02-06 ENCOUNTER — Ambulatory Visit
Admission: RE | Admit: 2018-02-06 | Discharge: 2018-02-06 | Disposition: A | Discharge status: Home / Self Care | Payer: Medicare Other | Source: Ambulatory Visit | Attending: Radiation Oncology | Admitting: Radiation Oncology

## 2018-02-06 DIAGNOSIS — Z1329 Encounter for screening for other suspected endocrine disorder: Secondary | ICD-10-CM | POA: Diagnosis not present

## 2018-02-06 DIAGNOSIS — G8929 Other chronic pain: Secondary | ICD-10-CM | POA: Diagnosis not present

## 2018-02-06 DIAGNOSIS — C090 Malignant neoplasm of tonsillar fossa: Secondary | ICD-10-CM | POA: Insufficient documentation

## 2018-02-06 DIAGNOSIS — Z510 Encounter for antineoplastic radiation therapy: Secondary | ICD-10-CM | POA: Diagnosis not present

## 2018-02-06 DIAGNOSIS — Z79899 Other long term (current) drug therapy: Secondary | ICD-10-CM | POA: Diagnosis not present

## 2018-02-07 ENCOUNTER — Ambulatory Visit
Admission: RE | Admit: 2018-02-07 | Discharge: 2018-02-07 | Disposition: A | Discharge status: Home / Self Care | Payer: Medicare Other | Source: Ambulatory Visit | Attending: Radiation Oncology | Admitting: Radiation Oncology

## 2018-02-07 ENCOUNTER — Inpatient Hospital Stay: Payer: Medicare Other | Attending: Hematology | Admitting: Nutrition

## 2018-02-07 DIAGNOSIS — Z510 Encounter for antineoplastic radiation therapy: Secondary | ICD-10-CM | POA: Diagnosis not present

## 2018-02-07 DIAGNOSIS — Z79899 Other long term (current) drug therapy: Secondary | ICD-10-CM | POA: Diagnosis not present

## 2018-02-07 DIAGNOSIS — C090 Malignant neoplasm of tonsillar fossa: Secondary | ICD-10-CM | POA: Diagnosis not present

## 2018-02-07 DIAGNOSIS — G8929 Other chronic pain: Secondary | ICD-10-CM | POA: Diagnosis not present

## 2018-02-07 DIAGNOSIS — Z1329 Encounter for screening for other suspected endocrine disorder: Secondary | ICD-10-CM | POA: Diagnosis not present

## 2018-02-07 NOTE — Progress Notes (Signed)
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 Current weight documented as 158 pounds down from 160 pounds December 10. Patient denies nausea and vomiting. He is having regular bowel movements. He is giving 1 bottle of Osmolite 1.5, 5 times a day with 60 mL free water before and after bolus feedings. He is also using free water flushes of 240 mL 3 times a day bolus via tube or by mouth. Patient is using a suction machine to assist with thick saliva.  Estimated nutrition needs: 2050-2250 cal, 90-105 g protein, 2.2 L fluid.  Nutrition diagnosis: Unintended weight loss continues.  Intervention: Encouraged patient to try 10 ounces of Osmolite 1.5 for the first 4 feedings daily.  For the fifth feeding he could give 8 ounces.  This will achieve goal rate of 6 bottles daily.  This provides 2130 cal, 89.4 g protein, and 2286 mL free water. Recommended patient continue to give feedings very slowly. Encouraged use of protein supplement. Questions were answered and teach back method used.  Monitoring, evaluation, goals: Patient will tolerate tube feeding at goal rate to minimize further weight loss.  Next visit: Wednesday, January 8 after radiation therapy.  **Disclaimer: This note was dictated with voice recognition software. Similar sounding words can inadvertently be transcribed and this note may contain transcription errors which may not have been corrected upon publication of note.**

## 2018-02-10 ENCOUNTER — Ambulatory Visit
Admission: RE | Admit: 2018-02-10 | Discharge: 2018-02-10 | Disposition: A | Discharge status: Home / Self Care | Payer: Medicare Other | Source: Ambulatory Visit | Attending: Radiation Oncology | Admitting: Radiation Oncology

## 2018-02-10 DIAGNOSIS — Z1329 Encounter for screening for other suspected endocrine disorder: Secondary | ICD-10-CM | POA: Diagnosis not present

## 2018-02-10 DIAGNOSIS — C090 Malignant neoplasm of tonsillar fossa: Secondary | ICD-10-CM | POA: Diagnosis not present

## 2018-02-10 DIAGNOSIS — G8929 Other chronic pain: Secondary | ICD-10-CM | POA: Diagnosis not present

## 2018-02-10 DIAGNOSIS — Z510 Encounter for antineoplastic radiation therapy: Secondary | ICD-10-CM | POA: Diagnosis not present

## 2018-02-10 DIAGNOSIS — Z79899 Other long term (current) drug therapy: Secondary | ICD-10-CM | POA: Diagnosis not present

## 2018-02-11 ENCOUNTER — Encounter: Payer: Self-pay | Admitting: Radiation Oncology

## 2018-02-11 ENCOUNTER — Ambulatory Visit
Admission: RE | Admit: 2018-02-11 | Discharge: 2018-02-11 | Disposition: A | Discharge status: Home / Self Care | Payer: Medicare Other | Source: Ambulatory Visit | Attending: Radiation Oncology | Admitting: Radiation Oncology

## 2018-02-11 DIAGNOSIS — Z510 Encounter for antineoplastic radiation therapy: Secondary | ICD-10-CM | POA: Diagnosis not present

## 2018-02-11 DIAGNOSIS — Z1329 Encounter for screening for other suspected endocrine disorder: Secondary | ICD-10-CM | POA: Diagnosis not present

## 2018-02-11 DIAGNOSIS — C090 Malignant neoplasm of tonsillar fossa: Secondary | ICD-10-CM | POA: Diagnosis not present

## 2018-02-11 DIAGNOSIS — G8929 Other chronic pain: Secondary | ICD-10-CM | POA: Diagnosis not present

## 2018-02-11 DIAGNOSIS — Z79899 Other long term (current) drug therapy: Secondary | ICD-10-CM | POA: Diagnosis not present

## 2018-02-11 NOTE — Progress Notes (Signed)
I spoke with Brandon [name] today about financial concerns.  I gave him the billing number to set up payments as well as information on the grant.

## 2018-02-12 ENCOUNTER — Inpatient Hospital Stay: Payer: Medicare Other | Admitting: Nutrition

## 2018-02-12 ENCOUNTER — Ambulatory Visit
Admission: RE | Admit: 2018-02-12 | Discharge: 2018-02-12 | Disposition: A | Discharge status: Home / Self Care | Payer: Medicare Other | Source: Ambulatory Visit | Attending: Radiation Oncology | Admitting: Radiation Oncology

## 2018-02-12 DIAGNOSIS — C090 Malignant neoplasm of tonsillar fossa: Secondary | ICD-10-CM | POA: Diagnosis not present

## 2018-02-12 DIAGNOSIS — Z79899 Other long term (current) drug therapy: Secondary | ICD-10-CM | POA: Diagnosis not present

## 2018-02-12 DIAGNOSIS — Z930 Tracheostomy status: Secondary | ICD-10-CM | POA: Diagnosis not present

## 2018-02-12 DIAGNOSIS — Z510 Encounter for antineoplastic radiation therapy: Secondary | ICD-10-CM | POA: Diagnosis not present

## 2018-02-12 DIAGNOSIS — Z1329 Encounter for screening for other suspected endocrine disorder: Secondary | ICD-10-CM | POA: Diagnosis not present

## 2018-02-12 DIAGNOSIS — G8929 Other chronic pain: Secondary | ICD-10-CM | POA: Diagnosis not present

## 2018-02-12 DIAGNOSIS — C099 Malignant neoplasm of tonsil, unspecified: Secondary | ICD-10-CM | POA: Diagnosis not present

## 2018-02-12 NOTE — Progress Notes (Signed)
Nutrition follow-up completed with patient diagnosed with left tonsil cancer.  He is receiving adjuvant radiation therapy. Current weight documented as 157.8 pounds which is stable. Patient denies nausea, vomiting, constipation, and diarrhea. He reports he has increased Osmolite 1.5-6 bottles daily with 60 mL free water before and after bolus feedings. He is using ProMod between feedings as tolerated.  Estimated nutrition needs: 2050-2250 cal, 90-105 g protein, 2.2 L fluid.  Nutrition diagnosis: Unintended weight loss is stable.  Intervention: Patient educated to continue 6 bottles Osmolite 1.5 as tolerated daily. This provides 2130 cal, 89.4 g protein, and 2286 mL free water including free water flushes. Each serving of ProMod provides an additional 100 cal and 10 g of protein. Questions were answered.  Teach back method used.  Monitoring, evaluation, goals: Patient will tolerate adequate calories and protein for weight maintenance.  Next visit: Wednesday, January 15 during infusion.  **Disclaimer: This note was dictated with voice recognition software. Similar sounding words can inadvertently be transcribed and this note may contain transcription errors which may not have been corrected upon publication of note.**

## 2018-02-13 ENCOUNTER — Ambulatory Visit
Admission: RE | Admit: 2018-02-13 | Discharge: 2018-02-13 | Disposition: A | Discharge status: Home / Self Care | Payer: Medicare Other | Source: Ambulatory Visit | Attending: Radiation Oncology | Admitting: Radiation Oncology

## 2018-02-13 DIAGNOSIS — Z1329 Encounter for screening for other suspected endocrine disorder: Secondary | ICD-10-CM | POA: Diagnosis not present

## 2018-02-13 DIAGNOSIS — Z510 Encounter for antineoplastic radiation therapy: Secondary | ICD-10-CM | POA: Diagnosis not present

## 2018-02-13 DIAGNOSIS — C090 Malignant neoplasm of tonsillar fossa: Secondary | ICD-10-CM | POA: Diagnosis not present

## 2018-02-13 DIAGNOSIS — G8929 Other chronic pain: Secondary | ICD-10-CM | POA: Diagnosis not present

## 2018-02-13 DIAGNOSIS — Z79899 Other long term (current) drug therapy: Secondary | ICD-10-CM | POA: Diagnosis not present

## 2018-02-14 ENCOUNTER — Ambulatory Visit
Admission: RE | Admit: 2018-02-14 | Discharge: 2018-02-14 | Disposition: A | Discharge status: Home / Self Care | Payer: Medicare Other | Source: Ambulatory Visit | Attending: Radiation Oncology | Admitting: Radiation Oncology

## 2018-02-14 DIAGNOSIS — Z1329 Encounter for screening for other suspected endocrine disorder: Secondary | ICD-10-CM | POA: Diagnosis not present

## 2018-02-14 DIAGNOSIS — Z510 Encounter for antineoplastic radiation therapy: Secondary | ICD-10-CM | POA: Diagnosis not present

## 2018-02-14 DIAGNOSIS — Z79899 Other long term (current) drug therapy: Secondary | ICD-10-CM | POA: Diagnosis not present

## 2018-02-14 DIAGNOSIS — C090 Malignant neoplasm of tonsillar fossa: Secondary | ICD-10-CM | POA: Diagnosis not present

## 2018-02-14 DIAGNOSIS — G8929 Other chronic pain: Secondary | ICD-10-CM | POA: Diagnosis not present

## 2018-02-17 ENCOUNTER — Telehealth: Payer: Self-pay | Admitting: *Deleted

## 2018-02-17 ENCOUNTER — Encounter: Payer: Self-pay | Admitting: Nutrition

## 2018-02-17 ENCOUNTER — Other Ambulatory Visit: Payer: Self-pay | Admitting: Physician Assistant

## 2018-02-17 ENCOUNTER — Ambulatory Visit
Admission: RE | Admit: 2018-02-17 | Discharge: 2018-02-17 | Disposition: A | Discharge status: Home / Self Care | Payer: Medicare Other | Source: Ambulatory Visit | Attending: Radiation Oncology | Admitting: Radiation Oncology

## 2018-02-17 ENCOUNTER — Other Ambulatory Visit (INDEPENDENT_AMBULATORY_CARE_PROVIDER_SITE_OTHER): Payer: Medicare Other

## 2018-02-17 DIAGNOSIS — G8929 Other chronic pain: Secondary | ICD-10-CM | POA: Diagnosis not present

## 2018-02-17 DIAGNOSIS — Z510 Encounter for antineoplastic radiation therapy: Secondary | ICD-10-CM | POA: Diagnosis not present

## 2018-02-17 DIAGNOSIS — D582 Other hemoglobinopathies: Secondary | ICD-10-CM | POA: Diagnosis not present

## 2018-02-17 DIAGNOSIS — C090 Malignant neoplasm of tonsillar fossa: Secondary | ICD-10-CM | POA: Diagnosis not present

## 2018-02-17 DIAGNOSIS — Z1329 Encounter for screening for other suspected endocrine disorder: Secondary | ICD-10-CM | POA: Diagnosis not present

## 2018-02-17 DIAGNOSIS — Z79899 Other long term (current) drug therapy: Secondary | ICD-10-CM | POA: Diagnosis not present

## 2018-02-17 LAB — CBC WITH DIFFERENTIAL/PLATELET
Basophils Absolute: 0 10*3/uL (ref 0.0–0.1)
Basophils Relative: 0.4 % (ref 0.0–3.0)
EOS ABS: 0.8 10*3/uL — AB (ref 0.0–0.7)
Eosinophils Relative: 14.4 % — ABNORMAL HIGH (ref 0.0–5.0)
HCT: 38.2 % — ABNORMAL LOW (ref 39.0–52.0)
Hemoglobin: 12.8 g/dL — ABNORMAL LOW (ref 13.0–17.0)
Lymphocytes Relative: 12.6 % (ref 12.0–46.0)
Lymphs Abs: 0.7 10*3/uL (ref 0.7–4.0)
MCHC: 33.5 g/dL (ref 30.0–36.0)
MCV: 82.1 fl (ref 78.0–100.0)
Monocytes Absolute: 0.4 10*3/uL (ref 0.1–1.0)
Monocytes Relative: 8.4 % (ref 3.0–12.0)
Neutro Abs: 3.4 10*3/uL (ref 1.4–7.7)
Neutrophils Relative %: 64.2 % (ref 43.0–77.0)
PLATELETS: 175 10*3/uL (ref 150.0–400.0)
RBC: 4.66 Mil/uL (ref 4.22–5.81)
RDW: 14.7 % (ref 11.5–15.5)
WBC: 5.3 10*3/uL (ref 4.0–10.5)

## 2018-02-17 NOTE — Progress Notes (Signed)
Patient cancelled nutrition appointment for Friday, Jan 17. States he is doing alright.

## 2018-02-17 NOTE — Telephone Encounter (Signed)
Called patient to inform that Brandon [name] the nutritionist said that it is o.k. if he misses this week's nutrition, patient is aware of this

## 2018-02-18 ENCOUNTER — Ambulatory Visit
Admission: RE | Admit: 2018-02-18 | Discharge: 2018-02-18 | Disposition: A | Discharge status: Home / Self Care | Payer: Medicare Other | Source: Ambulatory Visit | Attending: Radiation Oncology | Admitting: Radiation Oncology

## 2018-02-18 ENCOUNTER — Ambulatory Visit: Payer: Medicare Other | Attending: Radiation Oncology

## 2018-02-18 DIAGNOSIS — Z930 Tracheostomy status: Secondary | ICD-10-CM | POA: Diagnosis not present

## 2018-02-18 DIAGNOSIS — Z1329 Encounter for screening for other suspected endocrine disorder: Secondary | ICD-10-CM | POA: Diagnosis not present

## 2018-02-18 DIAGNOSIS — C090 Malignant neoplasm of tonsillar fossa: Secondary | ICD-10-CM | POA: Diagnosis not present

## 2018-02-18 DIAGNOSIS — G8929 Other chronic pain: Secondary | ICD-10-CM | POA: Diagnosis not present

## 2018-02-18 DIAGNOSIS — R1312 Dysphagia, oropharyngeal phase: Secondary | ICD-10-CM | POA: Diagnosis not present

## 2018-02-18 DIAGNOSIS — C099 Malignant neoplasm of tonsil, unspecified: Secondary | ICD-10-CM | POA: Diagnosis not present

## 2018-02-18 DIAGNOSIS — Z79899 Other long term (current) drug therapy: Secondary | ICD-10-CM | POA: Diagnosis not present

## 2018-02-18 DIAGNOSIS — Z510 Encounter for antineoplastic radiation therapy: Secondary | ICD-10-CM | POA: Diagnosis not present

## 2018-02-18 NOTE — Patient Instructions (Addendum)
Continue to complete your HEP provided to you at [facility], including [name], [name], effortful swallow, and lingual press. Please bring this next session.  We discussed and practiced - you must, when you eat: - decrease the size of your bites and sips - take 2 swallows for each bite/sip - follow food with a sip of liquid, every time - clear your throat and reswallow after two swallows - you said [name] encouraged you to tuck your chin with liquids; this is fine

## 2018-02-18 NOTE — Therapy (Signed)
[facility] [address] [place], [place], [address] Phone: [phone]   Fax:  [phone]  Speech Language Pathology Evaluation  Patient Details  Name: Brandon [name] MRN: [id number] Date of Birth: January 10, 1941 Referring Provider (SLP): Brandon Gibson, MD   Encounter Date: 02/18/2018  End of Session - 02/18/18 1105    Visit Number  1    Number of Visits  8    Date for SLP Re-Evaluation  05/19/18   90 days   SLP Start Time  0905    SLP Stop Time   1000    SLP Time Calculation (min)  55 min    Activity Tolerance  Patient tolerated treatment well       Past Medical History:  Diagnosis Date  . Aortic stenosis    mild echocardiogram 8/09 EF 60%, showed no regional wall motion abnml, mild LVH, mod focal basal septal hypertrophy and mild dyastolic dysfunction. 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 mean gradient across aortic valve was 8 mmHG. also mild L atrial enlargement and normal RV size and function. minimal AS on LHC in 7/10.   . Arthritis    "all over" (07/19/2016)  . BPH (benign prostatic hypertrophy)   . CAD (coronary artery disease)    LCH (7/10) totally occluded proximal RCA with very robuse L to R collaterals, 50% proximal LAD stenosis, EF 65%, medical management.    . Chicken pox   . Chronic lower back pain    s/p surgical fusion  . Diverticulosis   . Esophageal stricture   . GERD (gastroesophageal reflux disease)   . Heart murmur   . Hepatitis B 1984  . Hiatal hernia   . HLD (hyperlipidemia)   . HTN (hypertension)   . 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 Osteoarthritis   . TIA (transient ischemic attack) 1990s   hx  . tonsillar ca dx'd 11/2017  . Tubular adenoma of colon 2009    Past Surgical History:  Procedure Laterality Date  . BACK SURGERY    . CATARACT EXTRACTION W/ INTRAOCULAR LENS  IMPLANT, BILATERAL Bilateral 01/2012 - 02/2012  . DIRECT LARYNGOSCOPY  Left 10/09/2017   Procedure: DIRECT LARYNGOSCOPY WITH BOPSY;  Surgeon: Brandon Marble, MD;  Location: [facility];  Service: ENT;  Laterality: Left;  . ESOPHAGOGASTRODUODENOSCOPY (EGD) WITH ESOPHAGEAL DILATION     "couple times" (07/19/2016)  . ESOPHAGOSCOPY Left 10/09/2017   Procedure: ESOPHAGOSCOPY;  Surgeon: Brandon Marble, MD;  Location: [facility];  Service: ENT;  Laterality: Left;  . IR GASTROSTOMY TUBE MOD SED  01/08/2018  . IR THORACENTESIS ASP PLEURAL SPACE W/IMG GUIDE  07/19/2016  . LAPAROSCOPIC CHOLECYSTECTOMY  1994  . LUMBAR DISC SURGERY  05/1996   L4-5; Dr. Sherwood [name]   . LUMBAR LAMINECTOMY/DECOMPRESSION MICRODISCECTOMY  10/2002   L3-4. Dr. Sherwood [name]  . MULTIPLE TOOTH EXTRACTIONS  1980s  . PARTIAL GLOSSECTOMY  12/02/2017   Dr. Nicolette [name]- [facility]  . pharyngoplasty for closure of tingue base defect  12/02/2017   Dr. Nicolette [name]- [facility]  . PICC LINE INSERTION  07/15/2016  . POSTERIOR LUMBAR FUSION  09/1996   Brandon [name], L4-5 Dr. Rita [name]  . PROSTATE BIOPSY  ~ 2017  . radical tonsillectomy Left 12/02/2017   Dr. Nicolette [name] at [facility]  . RIGID BRONCHOSCOPY Left 10/09/2017   Procedure: RIGID BRONCHOSCOPY;  Surgeon: Brandon Marble, MD;  Location: [facility];  Service: ENT;  Laterality: Left;  . TRACHEOSTOMY  12/02/2017  Dr. Nicolette [name]- [facility]    There were no vitals filed for this visit.  Subjective Assessment - 02/18/18 0906    Subjective  "I'm going to unload on you today Brandon [name]."    Patient is accompained by:  Family member   wife   Currently in Pain?  No/denies         SLP Evaluation [facility] - 02/18/18 1856      SLP Visit Information   SLP Received On  02/18/18    Referring Provider (SLP)  Brandon Gibson, MD    Onset Date  August 2019    Medical Diagnosis  Lingual cancer      General Information   HPI  Pt with surgery at [facility] with complications post-op. FEES at [facility], most recent noted with reduced tongue base retraction, reduced UES opening,  reduced hyoid excursion. Prognosis is fair to guarded in the presence of RT. Pt is drinking liquids currently. He expresses frustration with his "tongue sewed to the back of my mouth."       Prior Functional Status   Cognitive/Linguistic Baseline  Within functional limits      Cognition   Overall Cognitive Status  Within Functional Limits for tasks assessed      Auditory Comprehension   Overall Auditory Comprehension  Appears within functional limits for tasks assessed      Verbal Expression   Overall Verbal Expression  Appears within functional limits for tasks assessed      Oral Motor/Sensory Function   Overall Oral Motor/Sensory Function  Impaired    Labial ROM  Reduced left   slight   Labial Symmetry  Abnormal symmetry left    Labial Strength  Reduced Left    Labial Sensation  Within Functional Limits    Lingual ROM  Reduced right;Reduced left    Lingual Symmetry  Abnormal symmetry left    Lingual Strength  Reduced Right    Lingual Sensation  Reduced Left    Lingual Coordination  Reduced    Facial Symmetry  Left droop    Facial Sensation  Reduced   left     Motor Speech   Overall Motor Speech  Impaired    Articulation  Impaired    Level of Impairment  Phrase    Intelligibility  Intelligible    Effective Techniques  Over-articulate       Pt currently tolerates thin liquids. He reports trying mashed egg last week and "as soon as I put it in my mouth, I just lost it all." Pt then took liquids swished and tucked his chin and swallowed without overt s/s aspiration reported. Today, pt took sips thin with max cues consistently for adherence to swallow precautions, faded to occasional rare cues. Pt req;d occasional min cues for stronger throat clear with PO liquids, faded to rare min A. Oral motor assessment was as above.   Because data states the risk for dysphagia during and after radiation treatment is high due to undergoing radiation tx, SLP taught pt about the possibility of  reduced/limited ability for PO intake during rad tx. SLP encouraged pt to continue swallowing POs as far into rad tx as possible.   SLP educated pt re: changes to swallowing musculature after rad tx, and why adherence to dysphagia HEP provided today and PO consumption was necessary to reduce muscle fibrosis following rad tx. Pt demonstrated understanding of these things to SLP.    SLP then reviewed the HEP developed for him after his FEES at [facility],  and pt was instructed how to perform exercises involving lingual, and pharyngeal strengthening. SLP performed each exercise and pt return demonstrated each exercise. SLP ensured pt performance was correct prior to moving on to next exercise.  (see "clinical impressions" for more details re: HEP performance today)  Pt was instructed to complete this program twice a day, 7 days/week until 6 months after his last rad tx, then x3 a week after that.                 SLP Education - 02/18/18 1103    Education Details  HEP procedure (based upon FEES report from december 2019), rationale for HEP, swallow precautions, nerve damage requires time for healing, speech and swallowing differences very likely in part due to flaccid lt lingual musculature    Person(s) Educated  Patient;Spouse    Methods  Explanation;Demonstration;Verbal cues;Handout    Comprehension  Verbalized understanding;Returned demonstration;Verbal cues required;Need further instruction       SLP Short Term Goals - 02/18/18 1115      SLP SHORT TERM GOAL #1   Title  pt will complete HEP with rare min A over two sessions    Time  4    Period  --   visits   Status  New      SLP SHORT TERM GOAL #2   Title  pt will follow swallow precautions with POs during ST, with rare min A over two sessions    Time  4    Period  --   visits   Status  New      SLP SHORT TERM GOAL #3   Title  pt will tell SLP why he is completing HEP over two sessions, with min questioning cues    Time   4    Period  --   visits   Status  New      SLP SHORT TERM GOAL #4   Title  pt will tell SLP rationale for food journal with min questioning cues    Time  3    Period  --   visits   Status  New       SLP Long Term Goals - 02/18/18 1117      SLP LONG TERM GOAL #1   Title  pt will follow swallow precautions with POs during ST over two sessions with modified independence    Time  6    Period  --   visits   Status  New      SLP LONG TERM GOAL #2   Title  pt will complete HEP with modified independence over two sessions    Time  6    Period  --   visits   Status  New      SLP LONG TERM GOAL #3   Title  pt will ID at least 3 overt s/s aspiration PNA with modified independence    Time  6    Period  --   visits   Status  New      SLP LONG TERM GOAL #4   Title  pt will tell when he can modify HEP frequency to x3/week, over 3 sessions    Time  8    Period  --   visits   Status  New       Plan - 02/18/18 1105    Clinical Impression Statement  Pt has moderate oropharyngeal dysphagia ID'd by fiberendoscopic eval of swallowing (FEES) on 01-15-18 at [facility].  HEP was provided by performing SLP after that exam. Recommended puree/thin with caveat of pending (now current) RT, at which time liquids were recommended. Pt req'd mod-max cues occasionally, which was somewhat unexpected as pt stated he had been performing HEP regularly. Pt was also unsure about swallow precautions, so SLP educated pt and wife on these as well. Pt with frustration re: lack of lingual mobilty - SLP told pt that some of this likely due to decr'd innervation at this time and that the future will tell him more about what his feeling and function will ultimately be. See pt instructions and pt education for more details. Pt would benefit from skilled ST to address oropharyngeal dysphagia by ensuring proper and accurate completion of HEP as well as correct adherence to swallow precautions.     Speech Therapy Frequency   --   every other week for 12 weeks/ 8 visits over 90 days   Duration  --   12 weeks (or april 13th)   Treatment/Interventions  Aspiration precaution training;Pharyngeal strengthening exercises;Diet toleration management by SLP;Trials of upgraded texture/liquids;Internal/external aids;Patient/family education;Compensatory strategies;SLP instruction and feedback;Environmental controls;Cueing hierarchy    Potential to Achieve Goals  Good    Potential Considerations  Severity of impairments;Ability to learn/carryover information    SLP Home Exercise Plan  reviewed today    Consulted and Agree with Plan of Care  Patient       Patient will benefit from skilled therapeutic intervention in order to improve the following deficits and impairments:   Dysphagia, oropharyngeal phase    Problem List Patient Active Problem List   Diagnosis Date Noted  . Cancer of tonsillar fossa ([place]) 09/20/2017  . Bruising 10/02/2016  . 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 Aneurysm of infrarenal abdominal aorta ([facility]) 07/10/2016  . Normocytic anemia 07/10/2016  . Tobacco abuse disorder 01/10/2014  . Seasonal allergies 11/23/2013  . Essential hypertension 05/19/2013  . Osteoarthritis 05/29/2009  . 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 Aortic valve disorder 01/28/2008  . BRAIN STEM STROKE 08/27/2007  . 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 BENIGN PROSTATIC HYPERTROPHY, HX OF 08/27/2007  . Hyperlipidemia 03/19/2007  . Anxiety state 03/19/2007  . GERD 03/19/2007    [facility] ,MS, CCC-SLP  02/18/2018, 11:21 AM  [facility] [address] [place], [place], [address] Phone: [phone]   Fax:  [phone]  Name: Brandon [name] MRN: [id number] Date of Birth: 30-Mar-1940

## 2018-02-19 ENCOUNTER — Ambulatory Visit
Admission: RE | Admit: 2018-02-19 | Discharge: 2018-02-19 | Disposition: A | Discharge status: Home / Self Care | Payer: Medicare Other | Source: Ambulatory Visit | Attending: Radiation Oncology | Admitting: Radiation Oncology

## 2018-02-19 ENCOUNTER — Inpatient Hospital Stay: Payer: Medicare Other | Admitting: Nutrition

## 2018-02-19 DIAGNOSIS — G8929 Other chronic pain: Secondary | ICD-10-CM | POA: Diagnosis not present

## 2018-02-19 DIAGNOSIS — B86 Scabies: Secondary | ICD-10-CM | POA: Diagnosis not present

## 2018-02-19 DIAGNOSIS — Z510 Encounter for antineoplastic radiation therapy: Secondary | ICD-10-CM | POA: Diagnosis not present

## 2018-02-19 DIAGNOSIS — C090 Malignant neoplasm of tonsillar fossa: Secondary | ICD-10-CM | POA: Diagnosis not present

## 2018-02-19 DIAGNOSIS — Z1329 Encounter for screening for other suspected endocrine disorder: Secondary | ICD-10-CM | POA: Diagnosis not present

## 2018-02-19 DIAGNOSIS — Z79899 Other long term (current) drug therapy: Secondary | ICD-10-CM | POA: Diagnosis not present

## 2018-02-20 ENCOUNTER — Ambulatory Visit
Admission: RE | Admit: 2018-02-20 | Discharge: 2018-02-20 | Disposition: A | Discharge status: Home / Self Care | Payer: Medicare Other | Source: Ambulatory Visit | Attending: Radiation Oncology | Admitting: Radiation Oncology

## 2018-02-20 DIAGNOSIS — Z510 Encounter for antineoplastic radiation therapy: Secondary | ICD-10-CM | POA: Diagnosis not present

## 2018-02-20 DIAGNOSIS — Z79899 Other long term (current) drug therapy: Secondary | ICD-10-CM | POA: Diagnosis not present

## 2018-02-20 DIAGNOSIS — G8929 Other chronic pain: Secondary | ICD-10-CM | POA: Diagnosis not present

## 2018-02-20 DIAGNOSIS — Z1329 Encounter for screening for other suspected endocrine disorder: Secondary | ICD-10-CM | POA: Diagnosis not present

## 2018-02-20 DIAGNOSIS — C090 Malignant neoplasm of tonsillar fossa: Secondary | ICD-10-CM | POA: Diagnosis not present

## 2018-02-21 ENCOUNTER — Inpatient Hospital Stay: Payer: Medicare Other | Admitting: Nutrition

## 2018-02-21 ENCOUNTER — Ambulatory Visit
Admission: RE | Admit: 2018-02-21 | Discharge: 2018-02-21 | Disposition: A | Discharge status: Home / Self Care | Payer: Medicare Other | Source: Ambulatory Visit | Attending: Radiation Oncology | Admitting: Radiation Oncology

## 2018-02-21 DIAGNOSIS — C090 Malignant neoplasm of tonsillar fossa: Secondary | ICD-10-CM | POA: Diagnosis not present

## 2018-02-21 DIAGNOSIS — Z1329 Encounter for screening for other suspected endocrine disorder: Secondary | ICD-10-CM | POA: Diagnosis not present

## 2018-02-21 DIAGNOSIS — Z79899 Other long term (current) drug therapy: Secondary | ICD-10-CM | POA: Diagnosis not present

## 2018-02-21 DIAGNOSIS — G8929 Other chronic pain: Secondary | ICD-10-CM | POA: Diagnosis not present

## 2018-02-21 DIAGNOSIS — Z510 Encounter for antineoplastic radiation therapy: Secondary | ICD-10-CM | POA: Diagnosis not present

## 2018-02-24 ENCOUNTER — Ambulatory Visit
Admission: RE | Admit: 2018-02-24 | Discharge: 2018-02-24 | Disposition: A | Discharge status: Home / Self Care | Payer: Medicare Other | Source: Ambulatory Visit | Attending: Radiation Oncology | Admitting: Radiation Oncology

## 2018-02-24 ENCOUNTER — Other Ambulatory Visit: Payer: Self-pay | Admitting: Radiation Oncology

## 2018-02-24 DIAGNOSIS — Z510 Encounter for antineoplastic radiation therapy: Secondary | ICD-10-CM | POA: Diagnosis not present

## 2018-02-24 DIAGNOSIS — G8929 Other chronic pain: Secondary | ICD-10-CM | POA: Diagnosis not present

## 2018-02-24 DIAGNOSIS — C090 Malignant neoplasm of tonsillar fossa: Secondary | ICD-10-CM | POA: Diagnosis not present

## 2018-02-24 DIAGNOSIS — Z1329 Encounter for screening for other suspected endocrine disorder: Secondary | ICD-10-CM | POA: Diagnosis not present

## 2018-02-24 DIAGNOSIS — Z79899 Other long term (current) drug therapy: Secondary | ICD-10-CM | POA: Diagnosis not present

## 2018-02-24 MED ORDER — OXYCODONE HCL 10 MG PO TABS: ORAL_TABLET | ORAL | 0 refills | Status: DC

## 2018-02-25 ENCOUNTER — Ambulatory Visit
Admission: RE | Admit: 2018-02-25 | Discharge: 2018-02-25 | Disposition: A | Discharge status: Home / Self Care | Payer: Medicare Other | Source: Ambulatory Visit | Attending: Radiation Oncology | Admitting: Radiation Oncology

## 2018-02-25 DIAGNOSIS — Z1329 Encounter for screening for other suspected endocrine disorder: Secondary | ICD-10-CM | POA: Diagnosis not present

## 2018-02-25 DIAGNOSIS — Z510 Encounter for antineoplastic radiation therapy: Secondary | ICD-10-CM | POA: Diagnosis not present

## 2018-02-25 DIAGNOSIS — Z79899 Other long term (current) drug therapy: Secondary | ICD-10-CM | POA: Diagnosis not present

## 2018-02-25 DIAGNOSIS — G8929 Other chronic pain: Secondary | ICD-10-CM | POA: Diagnosis not present

## 2018-02-25 DIAGNOSIS — C090 Malignant neoplasm of tonsillar fossa: Secondary | ICD-10-CM | POA: Diagnosis not present

## 2018-02-26 ENCOUNTER — Ambulatory Visit
Admission: RE | Admit: 2018-02-26 | Discharge: 2018-02-26 | Disposition: A | Discharge status: Home / Self Care | Payer: Medicare Other | Source: Ambulatory Visit | Attending: Radiation Oncology | Admitting: Radiation Oncology

## 2018-02-26 ENCOUNTER — Inpatient Hospital Stay: Payer: Medicare Other | Admitting: Nutrition

## 2018-02-26 DIAGNOSIS — G8929 Other chronic pain: Secondary | ICD-10-CM | POA: Diagnosis not present

## 2018-02-26 DIAGNOSIS — C090 Malignant neoplasm of tonsillar fossa: Secondary | ICD-10-CM | POA: Diagnosis not present

## 2018-02-26 DIAGNOSIS — Z510 Encounter for antineoplastic radiation therapy: Secondary | ICD-10-CM | POA: Diagnosis not present

## 2018-02-26 DIAGNOSIS — Z79899 Other long term (current) drug therapy: Secondary | ICD-10-CM | POA: Diagnosis not present

## 2018-02-26 DIAGNOSIS — Z1329 Encounter for screening for other suspected endocrine disorder: Secondary | ICD-10-CM | POA: Diagnosis not present

## 2018-02-26 NOTE — Progress Notes (Signed)
Nutrition follow-up completed with patient diagnosed with left tonsil cancer. Weight improved and documented as 161.2 pounds. Patient is tolerating Osmolite 1.5, 6 bottles daily with 60 mL free water before and after bolus feedings. He continues to use ProMod between feedings.  Estimated nutrition needs: 2050-2250 cal, 90-105 g protein, 2.2 L fluid.  Nutrition diagnosis: Unintended weight loss has improved.  Intervention: Patient educated to continue 6 bottles Osmolite 1.5 daily via PEG. He will continue to use ProMod providing an additional 100 cal and 10 g protein per serving. Patient is meeting 100% estimated nutrition needs.  Monitoring, evaluation, goals: Patient will tolerate adequate calories and protein for weight maintenance and healing.  Next visit: Wednesday, January 29 after final radiation therapy.  **Disclaimer: This note was dictated with voice recognition software. Similar sounding words can inadvertently be transcribed and this note may contain transcription errors which may not have been corrected upon publication of note.**

## 2018-02-27 ENCOUNTER — Ambulatory Visit
Admission: RE | Admit: 2018-02-27 | Discharge: 2018-02-27 | Disposition: A | Discharge status: Home / Self Care | Payer: Medicare Other | Source: Ambulatory Visit | Attending: Radiation Oncology | Admitting: Radiation Oncology

## 2018-02-27 DIAGNOSIS — G8929 Other chronic pain: Secondary | ICD-10-CM | POA: Diagnosis not present

## 2018-02-27 DIAGNOSIS — Z79899 Other long term (current) drug therapy: Secondary | ICD-10-CM | POA: Diagnosis not present

## 2018-02-27 DIAGNOSIS — Z1329 Encounter for screening for other suspected endocrine disorder: Secondary | ICD-10-CM | POA: Diagnosis not present

## 2018-02-27 DIAGNOSIS — C090 Malignant neoplasm of tonsillar fossa: Secondary | ICD-10-CM | POA: Diagnosis not present

## 2018-02-27 DIAGNOSIS — Z510 Encounter for antineoplastic radiation therapy: Secondary | ICD-10-CM | POA: Diagnosis not present

## 2018-02-28 ENCOUNTER — Ambulatory Visit
Admission: RE | Admit: 2018-02-28 | Discharge: 2018-02-28 | Disposition: A | Discharge status: Home / Self Care | Payer: Medicare Other | Source: Ambulatory Visit | Attending: Radiation Oncology | Admitting: Radiation Oncology

## 2018-02-28 DIAGNOSIS — Z510 Encounter for antineoplastic radiation therapy: Secondary | ICD-10-CM | POA: Diagnosis not present

## 2018-02-28 DIAGNOSIS — C090 Malignant neoplasm of tonsillar fossa: Secondary | ICD-10-CM | POA: Diagnosis not present

## 2018-02-28 DIAGNOSIS — Z1329 Encounter for screening for other suspected endocrine disorder: Secondary | ICD-10-CM | POA: Diagnosis not present

## 2018-02-28 DIAGNOSIS — Z79899 Other long term (current) drug therapy: Secondary | ICD-10-CM | POA: Diagnosis not present

## 2018-02-28 DIAGNOSIS — G8929 Other chronic pain: Secondary | ICD-10-CM | POA: Diagnosis not present

## 2018-03-03 ENCOUNTER — Ambulatory Visit
Admission: RE | Admit: 2018-03-03 | Discharge: 2018-03-03 | Disposition: A | Discharge status: Home / Self Care | Payer: Medicare Other | Source: Ambulatory Visit | Attending: Radiation Oncology | Admitting: Radiation Oncology

## 2018-03-03 DIAGNOSIS — C090 Malignant neoplasm of tonsillar fossa: Secondary | ICD-10-CM

## 2018-03-03 DIAGNOSIS — Z930 Tracheostomy status: Secondary | ICD-10-CM | POA: Diagnosis not present

## 2018-03-03 DIAGNOSIS — G8929 Other chronic pain: Secondary | ICD-10-CM | POA: Diagnosis not present

## 2018-03-03 DIAGNOSIS — Z79899 Other long term (current) drug therapy: Secondary | ICD-10-CM | POA: Diagnosis not present

## 2018-03-03 DIAGNOSIS — Z1329 Encounter for screening for other suspected endocrine disorder: Secondary | ICD-10-CM | POA: Diagnosis not present

## 2018-03-03 DIAGNOSIS — C099 Malignant neoplasm of tonsil, unspecified: Secondary | ICD-10-CM | POA: Diagnosis not present

## 2018-03-03 DIAGNOSIS — Z510 Encounter for antineoplastic radiation therapy: Secondary | ICD-10-CM | POA: Diagnosis not present

## 2018-03-03 MED ORDER — SONAFINE EX EMUL
1.0000 "application " | Freq: Once | CUTANEOUS | Status: AC
  Administered 2018-03-03: 1 via TOPICAL

## 2018-03-03 MED ORDER — SILVER SULFADIAZINE 1 % EX CREA
TOPICAL_CREAM | Freq: Two times a day (BID) | CUTANEOUS | Status: DC
  Administered 2018-03-03: 13:00:00 via TOPICAL

## 2018-03-04 ENCOUNTER — Ambulatory Visit
Admission: RE | Admit: 2018-03-04 | Discharge: 2018-03-04 | Disposition: A | Discharge status: Home / Self Care | Payer: Medicare Other | Source: Ambulatory Visit | Attending: Radiation Oncology | Admitting: Radiation Oncology

## 2018-03-04 DIAGNOSIS — Z1329 Encounter for screening for other suspected endocrine disorder: Secondary | ICD-10-CM | POA: Diagnosis not present

## 2018-03-04 DIAGNOSIS — Z79899 Other long term (current) drug therapy: Secondary | ICD-10-CM | POA: Diagnosis not present

## 2018-03-04 DIAGNOSIS — Z510 Encounter for antineoplastic radiation therapy: Secondary | ICD-10-CM | POA: Diagnosis not present

## 2018-03-04 DIAGNOSIS — G8929 Other chronic pain: Secondary | ICD-10-CM | POA: Diagnosis not present

## 2018-03-04 DIAGNOSIS — C090 Malignant neoplasm of tonsillar fossa: Secondary | ICD-10-CM | POA: Diagnosis not present

## 2018-03-05 ENCOUNTER — Ambulatory Visit
Admission: RE | Admit: 2018-03-05 | Discharge: 2018-03-05 | Disposition: A | Discharge status: Home / Self Care | Payer: Medicare Other | Source: Ambulatory Visit | Attending: Radiation Oncology | Admitting: Radiation Oncology

## 2018-03-05 ENCOUNTER — Encounter: Payer: Self-pay | Admitting: *Deleted

## 2018-03-05 ENCOUNTER — Inpatient Hospital Stay: Payer: Medicare Other | Admitting: Nutrition

## 2018-03-05 DIAGNOSIS — Z1329 Encounter for screening for other suspected endocrine disorder: Secondary | ICD-10-CM | POA: Diagnosis not present

## 2018-03-05 DIAGNOSIS — Z510 Encounter for antineoplastic radiation therapy: Secondary | ICD-10-CM | POA: Diagnosis not present

## 2018-03-05 DIAGNOSIS — G8929 Other chronic pain: Secondary | ICD-10-CM | POA: Diagnosis not present

## 2018-03-05 DIAGNOSIS — C090 Malignant neoplasm of tonsillar fossa: Secondary | ICD-10-CM | POA: Diagnosis not present

## 2018-03-05 DIAGNOSIS — Z79899 Other long term (current) drug therapy: Secondary | ICD-10-CM | POA: Diagnosis not present

## 2018-03-05 NOTE — Progress Notes (Signed)
Nutrition follow-up completed with patient and wife.  Today with his final radiation therapy for his left tonsil cancer. Weight is stable and documented as 161.2 pounds. Patient continues to tolerate Osmolite 1.5, 6 bottles daily with 60 mL free water before and after bolus feedings. He is using ProMod occasionally between feedings.  Estimated nutrition needs: 2050- 2250 calories, 90-105 g protein, 2.2 L fluid.  Nutrition diagnosis: Unintended weight loss resolved.  Provided support and encouragement to patient to continue strategies for adequate tube feeding.  Encouraged activity as patient is able and permitted by physician.  Diet advancement per MD.  Nutrition follow-up scheduled on Monday, March 2.

## 2018-03-05 NOTE — Progress Notes (Signed)
Oncology Nurse Navigator Documentation  Met with Brandon [name] during final RT to offer support and to celebrate end of radiation treatment.  He was accompanied by his wife. Provided wife Certificate of Recognition for her supportive care. Provided verbal/written post-RT guidance:  Importance of keeping all follow-up appts, especially those with Nutrition and SLP.  Importance of protecting treatment area from sun.  Continuation of Sonafine application 2-3 times daily until supply exhausted after which transition to OTC lotion with vitamin E. Explained my role as navigator will continue for several more months and that I will be calling and/or joining him during follow-up visits.   I encouraged him to call me with needs/concerns.   Patient and wife verbalized understanding of information provided.  Gayleen Orem, RN, BSN Head & Neck Oncology [place] at [place] [phone]

## 2018-03-07 ENCOUNTER — Encounter: Payer: Self-pay | Admitting: Radiation Oncology

## 2018-03-07 ENCOUNTER — Telehealth: Payer: Self-pay | Admitting: *Deleted

## 2018-03-07 NOTE — Telephone Encounter (Signed)
Oncology Nurse Navigator Documentation  Called Mr. Schreier upon notification of his cancellation of Monday's 10:15 appt with SLP Garald Balding.  He reported slight increase in throat soreness since completing RT on Wednesday, stated not able to conduct all HEP.  I reinforced importance for him to keep appt since he just met with [name] a couple of weeks ago and needs confirmation he is conducting HEP correctly.  He voiced understanding, agreed to come in for appt as previously scheduled.  I notified Garald Balding and his scheduler.  Gayleen Orem, RN, BSN Head & Neck Oncology Nurse [place] at [place] [phone]

## 2018-03-07 NOTE — Progress Notes (Signed)
  Radiation Oncology         [phone] ________________________________  Name: Brandon [name] MRN: [id number]  Date: 03/07/2018  DOB: 04-16-1940  End of Treatment Note  Diagnosis:   Cancer Staging Cancer of tonsillar fossa ([place]) Staging form: Pharynx - HPV-Mediated Oropharynx, AJCC 8th Edition - Clinical: Stage I (cT2, cN0, cM0, p16+) - Signed by Tish Men, MD on 11/06/2017 - Pathologic: Stage II (pT3, pN0, cM0, p16+) - Signed by Eppie Gibson, MD on 01/01/2018  Indication for treatment:  Curative       Radiation treatment dates:   01/16/2018-03/05/2018  Site/dose:  Left tonsil tumor bed and left neck nodes, total dose 66 Gy in 33 fractions   Beams/energy:   VMAT IMRT / 6X   Narrative: The patient tolerated radiation treatment relatively well. At the beginning of his treatment, he noted mouth pain and thick saliva production (used a portable suction). He was noted to be using sonafine BID. On PE, it was noted, no mucositis, copious secrestions, and 1 cm area over left neck where surgery was performed. Towards the end of his treatment, he noted, increasing throat pain, dry mouth, "scratchy" throat (using biotene with relief). He was noted to be using his PEG for water and nutrition. He also noted mild nasal congestion with yellow sputum. On PE, it was noted, patchy mucositis in mouth, erythema over neck, persistent open area in lower left neck (no infection), lungs were clear.  He actually improved symptomatically, overall, during radiotherapy.  Plan: The patient has completed radiation treatment. The patient will return to radiation oncology clinic for routine followup in one half month. I advised the patient to call or return sooner if any questions or concerns arise that are related to recovery or treatment.  -----------------------------------  Eppie Gibson, MD  This document serves as a record of services personally performed by Eppie Gibson, MD. It was created on her behalf by  Steva Colder, a trained medical scribe. The creation of this record is based on the scribe's personal observations and the provider's statements to them. This document has been checked and approved by the attending provider.

## 2018-03-10 ENCOUNTER — Ambulatory Visit: Payer: Medicare Other

## 2018-03-10 ENCOUNTER — Ambulatory Visit: Payer: Medicare Other | Attending: Radiation Oncology

## 2018-03-10 DIAGNOSIS — R1312 Dysphagia, oropharyngeal phase: Secondary | ICD-10-CM | POA: Diagnosis not present

## 2018-03-10 NOTE — Therapy (Signed)
[facility] [address] [place], [place], [address] Phone: [phone]   Fax:  [phone]  Speech Language Pathology Treatment  Patient Details  Name: ADARRYL GOLDAMMER MRN: [id number] Date of Birth: [birth year] Referring Provider (SLP): Eppie Gibson, MD   Encounter Date: 03/10/2018  End of Session - 03/10/18 1125    Visit Number  2    Number of Visits  8    Date for SLP Re-Evaluation  05/19/18    SLP Start Time  1021    SLP Stop Time   1100    SLP Time Calculation (min)  39 min    Activity Tolerance  Patient tolerated treatment well       Past Medical History:  Diagnosis Date  . Aortic stenosis    mild echocardiogram 8/09 EF 60%, showed no regional wall motion abnml, mild LVH, mod focal basal septal hypertrophy and mild dyastolic dysfunction. 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 mean gradient across aortic valve was 8 mmHG. also mild L atrial enlargement and normal RV size and function. minimal AS on LHC in 7/10.   . Arthritis    "all over" (07/19/2016)  . BPH (benign prostatic hypertrophy)   . CAD (coronary artery disease)    LCH (7/10) totally occluded proximal RCA with very robuse L to R collaterals, 50% proximal LAD stenosis, EF 65%, medical management.    . Chicken pox   . Chronic lower back pain    s/p surgical fusion  . Diverticulosis   . Esophageal stricture   . GERD (gastroesophageal reflux disease)   . Heart murmur   . Hepatitis B 1984  . Hiatal hernia   . HLD (hyperlipidemia)   . HTN (hypertension)   . 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 Osteoarthritis   . TIA (transient ischemic attack) 1990s   hx  . tonsillar ca dx'd 11/2017  . Tubular adenoma of colon 2009    Past Surgical History:  Procedure Laterality Date  . BACK SURGERY    . CATARACT EXTRACTION W/ INTRAOCULAR LENS  IMPLANT, BILATERAL Bilateral 01/2012 - 02/2012  . DIRECT LARYNGOSCOPY Left 10/09/2017    Procedure: DIRECT LARYNGOSCOPY WITH BOPSY;  Surgeon: Jodi Marble, MD;  Location: [facility];  Service: ENT;  Laterality: Left;  . ESOPHAGOGASTRODUODENOSCOPY (EGD) WITH ESOPHAGEAL DILATION     "couple times" (07/19/2016)  . ESOPHAGOSCOPY Left 10/09/2017   Procedure: ESOPHAGOSCOPY;  Surgeon: Jodi Marble, MD;  Location: [facility];  Service: ENT;  Laterality: Left;  . IR GASTROSTOMY TUBE MOD SED  01/08/2018  . IR THORACENTESIS ASP PLEURAL SPACE W/IMG GUIDE  07/19/2016  . LAPAROSCOPIC CHOLECYSTECTOMY  1994  . LUMBAR DISC SURGERY  05/1996   L4-5; Dr. Sherwood Gambler   . LUMBAR LAMINECTOMY/DECOMPRESSION MICRODISCECTOMY  10/2002   L3-4. Dr. Sherwood Gambler  . MULTIPLE TOOTH EXTRACTIONS  1980s  . PARTIAL GLOSSECTOMY  12/02/2017   Dr. Nicolette Bang- [facility]  . pharyngoplasty for closure of tingue base defect  12/02/2017   Dr. Nicolette Bang- [facility]  . PICC LINE INSERTION  07/15/2016  . POSTERIOR LUMBAR FUSION  09/1996   Ray cage, L4-5 Dr. Rita Ohara  . PROSTATE BIOPSY  ~ 2017  . radical tonsillectomy Left 12/02/2017   Dr. [facility]ico[facility] at [facility]  . RIGID BRONCHOSCOPY Left 10/09/2017   Procedure: RIGID BRONCHOSCOPY;  Surgeon: Jodi Marble, MD;  Location: [facility];  Service: ENT;  Laterality: Left;  . TRACHEOSTOMY  12/02/2017   Dr.  Divernon- [facility]    There were no vitals filed for this visit.  Subjective Assessment - 03/10/18 1031    Subjective  Pt demonstrates nasal emissions during conversational speech. Pain reported with any tongue pressing or extensive movement with tongue stretch.    Patient is accompained by:  Family member   wife   Currently in Pain?  No/denies   pt premedicated today           ADULT SLP TREATMENT - 03/10/18 0001      General Information   Behavior/Cognition  Alert;Cooperative;Pleasant mood      Treatment Provided   Treatment provided  Dysphagia      Dysphagia Treatment   Temperature Spikes Noted  No    Respiratory Status  Room air     Oral Cavity - Dentition  Missing dentition    Treatment Methods  Skilled observation;Therapeutic exercise;Patient/caregiver education    Patient observed directly with PO's  Yes    Type of PO's observed  Thin liquids    Liquids provided via  Cup    Oral Phase Signs & Symptoms  --   none noted   Pharyngeal Phase Signs & Symptoms  Other (comment);Complaints of residue   none noted with 4 drinks H2O (swish and swallow)   Type of cueing  Visual;Verbal   occasional cues faded to rare cues for precautions   Other treatment/comments  No overt s/s aspiration PNA; no signs reported by pt/wife. Pt drinks regularly but has waned from solid POs recently with conclusion of rad tx. SLP encrouaged pt to incr solid POs as he is able; pt ate a little less than level teaspoons applesauce x3 today with reports of residue and pt had spontaneous liquid wash to clear. SLP enouraged pt to ingest puree POs with liquid wash as he is able. Pt req'd cues for following aspiration/swallowing precautions. SLP req'd rare verbal cues with HEP procedure today, SLP encouraged pt to complete 10 reps of each exercise prescribed by [name] x3/day at least. Pt told SLP why he was completing exercises with mod A (e.g., reduce/eliminate possibility of muscle fibrosis due to rad tx).       Dysphagia Recommendations   Diet recommendations  Dysphagia 1 (puree);Thin liquid   with strict adherence to precautions   Liquids provided via  Cup    Medication Administration  Via alternative means    Supervision  Intermittent supervision to cue for compensatory strategies    Compensations  Small sips/bites;Clear throat after each swallow    Postural Changes and/or Swallow Maneuvers  Chin tuck   liquids      SLP Education - 03/10/18 1124    Education Details  rationale for HEP, procedure for HEP, swallowing/aspiration precautions    Person(s) Educated  Spouse;Patient    Methods  Explanation;Demonstration;Handout;Verbal cues    Comprehension   Verbalized understanding;Need further instruction;Returned demonstration;Verbal cues required       SLP Short Term Goals - 03/10/18 1131      SLP SHORT TERM GOAL #1   Title  pt will complete HEP with rare min A over two sessions    Time  4    Period  --   visits   Status  On-going      SLP SHORT TERM GOAL #2   Title  pt will follow swallow precautions with POs during ST, with rare min A over two sessions    Time  4    Period  --   visits   Status  On-going      SLP SHORT TERM GOAL #3   Title  pt will tell SLP why he is completing HEP over two sessions, with min questioning cues    Time  4    Period  --   visits   Status  On-going      SLP SHORT TERM GOAL #4   Title  pt will tell SLP rationale for food journal with min questioning cues    Time  3    Period  --   visits   Status  On-going       SLP Long Term Goals - 03/10/18 1132      SLP LONG TERM GOAL #1   Title  pt will follow swallow precautions with POs during ST over two sessions with modified independence    Time  6    Period  --   visits   Status  On-going      SLP LONG TERM GOAL #2   Title  pt will complete HEP with modified independence over two sessions    Time  6    Period  --   visits   Status  On-going      SLP LONG TERM GOAL #3   Title  pt will ID at least 3 overt s/s aspiration PNA with modified independence    Time  6    Period  --   visits   Status  On-going      SLP LONG TERM GOAL #4   Title  pt will tell when he can modify HEP frequency to x3/week, over 3 sessions    Time  8    Period  --   visits   Status  On-going       Plan - 03/10/18 1125    Clinical Impression Statement  Pt has moderate oropharyngeal dysphagia ID'd by fiberendoscopic eval of swallowing (FEES) on 01-15-18 at [facility]. HEP was reviewed and corrected via verbal and visual cues by SLP today. Pt req'd A to recall and use strategies when ingesting POs. No overt s/s aspiration PNA today, nor any reported.   Recommended puree at least x1/day and thin with strict adherence to swallow/aspiration precautions. Pt would cont to benefit from skilled ST to address oropharyngeal dysphagia by ensuring proper and accurate completion of HEP as well as correct adherence to swallow precautions.     Speech Therapy Frequency  --   every other week for 12 weeks/90 days   Duration  --   12 weeks (april 13th)   Treatment/Interventions  Aspiration precaution training;Pharyngeal strengthening exercises;Diet toleration management by SLP;Trials of upgraded texture/liquids;Internal/external aids;Patient/family education;Compensatory strategies;SLP instruction and feedback;Environmental controls;Cueing hierarchy    Potential to Achieve Goals  Good    Potential Considerations  Severity of impairments;Ability to learn/carryover information    SLP Home Exercise Plan  reviewed today    Consulted and Agree with Plan of Care  Patient       Patient will benefit from skilled therapeutic intervention in order to improve the following deficits and impairments:   Dysphagia, oropharyngeal phase    Problem List Patient Active Problem List   Diagnosis Date Noted  . 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 Bruising 10/02/2016  . 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 Aneurysm of infrarenal abdominal aorta (HCC) 07/10/2016  . Normocytic anemia 07/10/2016  . Tobacco abuse disorder 01/10/2014  . Seasonal allergies 11/23/2013  . Essential hypertension 05/19/2013  . Osteoarthritis 05/29/2009  . 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  Aortic valve disorder 01/28/2008  . BRAIN STEM STROKE 08/27/2007  . 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 BENIGN PROSTATIC HYPERTROPHY, HX OF 08/27/2007  . Hyperlipidemia 03/19/2007  . Anxiety state 03/19/2007  . GERD 03/19/2007    [facility] ,[place], [place]  03/10/2018, 11:32 AM  [facility] [address] [place], [place], [address] Phone: [phone]    Fax:  [phone]   Name: TRAMAIN GERSHMAN MRN: [id number] Date of Birth: 1940/10/11

## 2018-03-11 ENCOUNTER — Encounter: Payer: Self-pay | Admitting: Physician Assistant

## 2018-03-11 ENCOUNTER — Ambulatory Visit: Payer: Medicare Other | Admitting: Physician Assistant

## 2018-03-11 ENCOUNTER — Other Ambulatory Visit: Payer: Self-pay

## 2018-03-11 VITALS — BP 120/70 | HR 87 | Temp 98.4°F | Resp 14 | Ht 67.0 in | Wt 161.0 lb

## 2018-03-11 DIAGNOSIS — R1312 Dysphagia, oropharyngeal phase: Secondary | ICD-10-CM | POA: Insufficient documentation

## 2018-03-11 DIAGNOSIS — J0110 Acute frontal sinusitis, unspecified: Secondary | ICD-10-CM | POA: Diagnosis not present

## 2018-03-11 DIAGNOSIS — J310 Chronic rhinitis: Secondary | ICD-10-CM

## 2018-03-11 MED ORDER — DOXYCYCLINE HYCLATE 100 MG PO CAPS: 100.0000 mg | ORAL_CAPSULE | Freq: Two times a day (BID) | ORAL | 0 refills | Status: DC

## 2018-03-11 NOTE — Progress Notes (Signed)
Patient presents to clinic today c/o increased post-nasal drainage, increased from baseline after his surgery and radiation. Notes sinus pressure, sinus pain. Throat soreness noted but chronic after radiation. Is slowly improving per patient. Is getting choked sometimes on his nasal drainage. Denies fever of chills. Is trying to keep hydrated. IS supposed to follow-up with specialist next week to discuss removal of his trach.   Past Medical History:  Diagnosis Date  . Aortic stenosis    mild echocardiogram 8/09 EF 60%, showed no regional wall motion abnml, mild LVH, mod focal basal septal hypertrophy and mild dyastolic dysfunction. 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 mean gradient across aortic valve was 8 mmHG. also mild L atrial enlargement and normal RV size and function. minimal AS on LHC in 7/10.   . Arthritis    "all over" (07/19/2016)  . BPH (benign prostatic hypertrophy)   . CAD (coronary artery disease)    LCH (7/10) totally occluded proximal RCA with very robuse L to R collaterals, 50% proximal LAD stenosis, EF 65%, medical management.    . Chicken pox   . Chronic lower back pain    s/p surgical fusion  . Diverticulosis   . Esophageal stricture   . GERD (gastroesophageal reflux disease)   . Heart murmur   . Hepatitis B 1984  . Hiatal hernia   . HLD (hyperlipidemia)   . HTN (hypertension)   . 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 Osteoarthritis   . TIA (transient ischemic attack) 1990s   hx  . tonsillar ca dx'd 11/2017  . Tubular adenoma of colon 2009    Current Outpatient Medications on File Prior to Visit  Medication Sig Dispense Refill  . amLODipine (NORVASC) 10 MG tablet Take 1 tablet (10 mg total) by mouth daily. 90 tablet 3  . aspirin 81 MG EC tablet Take 81 mg by mouth at bedtime.     Marland Kitchen atorvastatin (LIPITOR) 40 MG tablet Take 1 tablet (40 mg total) by mouth daily. 90 tablet 3  . clonazePAM (KLONOPIN) 0.5 MG tablet TAKE 1  TABLET(0.5 MG) BY MOUTH TWICE DAILY AS NEEDED FOR ANXIETY 20 tablet 0  . isosorbide mononitrate (IMDUR) 30 MG 24 hr tablet TAKE 1 TABLET(30 MG) BY MOUTH AT BEDTIME 90 tablet 0  . metoprolol tartrate (LOPRESSOR) 25 MG tablet Take 0.5 tablets (12.5 mg total) by mouth 2 (two) times daily. 30 tablet 5  . Nutritional Supplements (HIGH-PROTEIN NUTRITIONAL SHAKE PO) Take by mouth.    . Oxycodone HCl 10 MG TABS Take one tablet by mouth every 3-4 hours PRN pain. 30 tablet 0  . pantoprazole (PROTONIX) 40 MG tablet Take 1 tablet (40 mg total) by mouth daily. 30 tablet 3  . lisinopril (PRINIVIL,ZESTRIL) 10 MG tablet Take 1 tablet (10 mg total) by mouth daily. (Patient not taking: Reported on 03/11/2018) 90 tablet 3   No current facility-administered medications on file prior to visit.     Allergies  Allergen Reactions  . Celebrex [Celecoxib] Hives and Itching    Family History  Problem Relation Age of Onset  . Heart disease Father 57       Living  . Coronary artery disease Father        CABG  . Alzheimer's disease Mother 59       Deceased  . Arthritis/Rheumatoid Mother   . Stomach cancer Maternal Uncle   . Brain cancer Maternal Aunt        x2  .  Arthritis Brother        DJD  . Obesity Daughter        Had Bypass Sx    Social History   Socioeconomic History  . Marital status: Married    Spouse name: etta  . Number of children: 2  . Years of education: Not on file  . Highest education level: Not on file  Occupational History  . Occupation: retired    Fish farm manager: RETIRED    Comment: disabled due to back problems  Social Needs  . Financial resource strain: Not on file  . Food insecurity:    Worry: Not on file    Inability: Not on file  . Transportation needs:    Medical: No    Non-medical: No  Tobacco Use  . Smoking status: Former Smoker    Packs/day: 1.50    Years: 50.00    Pack years: 75.00    Types: Cigarettes    Last attempt to quit: 09/05/2017    Years since quitting: 0.5    . Smokeless tobacco: Never Used  . Tobacco comment: smoked less than 1 ppd for 40+ years; Using Vapor Cig  Substance and Sexual Activity  . Alcohol use: Not Currently    Alcohol/week: 0.0 standard drinks    Comment: 07/19/2016 "might have a few drinks/year"  . Drug use: No  . Sexual activity: Never  Lifestyle  . Physical activity:    Days per week: Not on file    Minutes per session: Not on file  . Stress: Not on file  Relationships  . Social connections:    Talks on phone: Not on file    Gets together: Not on file    Attends religious service: Not on file    Active member of club or organization: Not on file    Attends meetings of clubs or organizations: Not on file    Relationship status: Not on file  Other Topics Concern  . Not on file  Social History Narrative   Married (3rd), [place]. 2 children from 1st marriage, 4 step children.    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       Cell # [id number]   Review of Systems - See HPI.  All other ROS are negative.  BP 120/70   Pulse 87   Temp 98.4 F (36.9 C) (Oral)   Resp 14   Ht _0  (1.702 m)   Wt 161 lb (73 kg)   SpO2 97%   BMI 25.22 kg/m   Physical Exam Vitals signs reviewed.  Constitutional:      Appearance: Normal appearance.  HENT:     Head: Normocephalic and atraumatic.     Right Ear: Tympanic membrane and external ear normal.     Left Ear: Tympanic membrane and external ear normal.     Nose: Congestion and rhinorrhea present.     Right Turbinates: Swollen.     Left Turbinates: Swollen.     Right Sinus: Frontal sinus tenderness present.     Left Sinus: Frontal sinus tenderness present.     Comments: Trach in place    Mouth/Throat:     Mouth: Mucous membranes are moist.  Eyes:     Conjunctiva/sclera: Conjunctivae normal.  Neck:     Musculoskeletal: Neck supple.  Cardiovascular:     Rate and Rhythm: Normal rate and  regular rhythm.  Pulmonary:  Effort: Pulmonary effort is normal.     Breath sounds: Normal breath sounds.  Neurological:     Mental Status: He is alert.  Psychiatric:        Mood and Affect: Mood normal.    Recent Results (from the past 2160 hour(s))  Comprehensive metabolic panel     Status: Abnormal   Collection Time: 12/22/17 12:23 AM  Result Value Ref Range   Sodium 138 135 - 145 mmol/L   Potassium 4.1 3.5 - 5.1 mmol/L   Chloride 101 98 - 111 mmol/L   CO2 26 22 - 32 mmol/L   Glucose, Bld 132 (H) 70 - 99 mg/dL   BUN 24 (H) 8 - 23 mg/dL   Creatinine, Ser 0.77 0.61 - 1.24 mg/dL   Calcium 9.1 8.9 - 10.3 mg/dL   Total Protein 6.7 6.5 - 8.1 g/dL   Albumin 3.4 (L) 3.5 - 5.0 g/dL   AST 22 15 - 41 U/L   ALT 38 0 - 44 U/L   Alkaline Phosphatase 201 (H) 38 - 126 U/L   Total Bilirubin 0.9 0.3 - 1.2 mg/dL   GFR calc non Af Amer >60 >60 mL/min   GFR calc Af Amer >60 >60 mL/min    Comment: (NOTE) The eGFR has been calculated using the CKD EPI equation. This calculation has not been validated in all clinical situations. eGFR's persistently <60 mL/min signify possible Chronic Kidney Disease.    Anion gap 11 5 - 15    Comment: Performed at [facility], [place] [address] [place], [place] [address]  CBC WITH DIFFERENTIAL     Status: Abnormal   Collection Time: 12/22/17 12:23 AM  Result Value Ref Range   WBC 8.6 4.0 - 10.5 K/uL   RBC 3.52 (L) 4.22 - 5.81 MIL/uL   Hemoglobin 10.3 (L) 13.0 - 17.0 g/dL   HCT 33.1 (L) 39.0 - 52.0 %   MCV 94.0 80.0 - 100.0 fL   MCH 29.3 26.0 - 34.0 pg   MCHC 31.1 30.0 - 36.0 g/dL   RDW 13.7 11.5 - 15.5 %   Platelets 358 150 - 400 K/uL   nRBC 0.0 0.0 - 0.2 %   Neutrophils Relative % 73 %   Neutro Abs 6.3 1.7 - 7.7 K/uL   Lymphocytes Relative 12 %   Lymphs Abs 1.0 0.7 - 4.0 K/uL   Monocytes Relative 9 %   Monocytes Absolute 0.7 0.1 - 1.0 K/uL   Eosinophils Relative 5 %   Eosinophils Absolute 0.4 0.0 - 0.5 K/uL   Basophils  Relative 0 %   Basophils Absolute 0.0 0.0 - 0.1 K/uL   Immature Granulocytes 1 %   Abs Immature Granulocytes 0.09 (H) 0.00 - 0.07 K/uL    Comment: Performed at [facility], [place] [address], [place], [place] [address]  I-Stat CG4 Lactic Acid, ED     Status: None   Collection Time: 12/22/17 12:31 AM  Result Value Ref Range   Lactic Acid, Venous 0.93 0.5 - 1.9 mmol/L  Comprehensive metabolic panel     Status: Abnormal   Collection Time: 01/08/18 12:47 PM  Result Value Ref Range   Sodium 140 135 - 145 mmol/L   Potassium 3.8 3.5 - 5.1 mmol/L   Chloride 104 98 - 111 mmol/L   CO2 26 22 - 32 mmol/L   Glucose, Bld 116 (H) 70 - 99 mg/dL   BUN 23 8 - 23 mg/dL   Creatinine, Ser 0.54 (L) 0.61 -  1.24 mg/dL   Calcium 9.1 8.9 - 10.3 mg/dL   Total Protein 6.7 6.5 - 8.1 g/dL   Albumin 3.8 3.5 - 5.0 g/dL   AST 26 15 - 41 U/L   ALT 35 0 - 44 U/L   Alkaline Phosphatase 151 (H) 38 - 126 U/L   Total Bilirubin 1.0 0.3 - 1.2 mg/dL   GFR calc non Af Amer >60 >60 mL/min   GFR calc Af Amer >60 >60 mL/min   Anion gap 10 5 - 15    Comment: Performed at [facility], [place] [address], [place], [place] [address]  APTT     Status: None   Collection Time: 01/08/18 12:47 PM  Result Value Ref Range   aPTT 32 24 - 36 seconds    Comment: Performed at [facility], [place] [address], [place], [place] [address]  CBC     Status: Abnormal   Collection Time: 01/08/18 12:47 PM  Result Value Ref Range   WBC 5.9 4.0 - 10.5 K/uL   RBC 3.75 (L) 4.22 - 5.81 MIL/uL   Hemoglobin 10.8 (L) 13.0 - 17.0 g/dL   HCT 34.3 (L) 39.0 - 52.0 %   MCV 91.5 80.0 - 100.0 fL   MCH 28.8 26.0 - 34.0 pg   MCHC 31.5 30.0 - 36.0 g/dL   RDW 13.0 11.5 - 15.5 %   Platelets 154 150 - 400 K/uL   nRBC 0.0 0.0 - 0.2 %    Comment: Performed at [facility], [place] [address], [place],  [address]  Protime-INR     Status: None   Collection Time: 01/08/18 12:47 PM  Result  Value Ref Range   Prothrombin Time 13.4 11.4 - 15.2 seconds   INR 1.03     Comment: Performed at [facility], [place] [address], [place],  [address]  CBC w/Diff     Status: Abnormal   Collection Time: 02/17/18  2:33 PM  Result Value Ref Range   WBC 5.3 4.0 - 10.5 K/uL   RBC 4.66 4.22 - 5.81 Mil/uL   Hemoglobin 12.8 (L) 13.0 - 17.0 g/dL   HCT 38.2 (L) 39.0 - 52.0 %   MCV 82.1 78.0 - 100.0 fl   MCHC 33.5 30.0 - 36.0 g/dL   RDW 14.7 11.5 - 15.5 %   Platelets 175.0 150.0 - 400.0 K/uL   Neutrophils Relative % 64.2 43.0 - 77.0 %   Lymphocytes Relative 12.6 12.0 - 46.0 %   Monocytes Relative 8.4 3.0 - 12.0 %   Eosinophils Relative 14.4 (H) 0.0 - 5.0 %   Basophils Relative 0.4 0.0 - 3.0 %   Neutro Abs 3.4 1.4 - 7.7 K/uL   Lymphs Abs 0.7 0.7 - 4.0 K/uL   Monocytes Absolute 0.4 0.1 - 1.0 K/uL   Eosinophils Absolute 0.8 (H) 0.0 - 0.7 K/uL   Basophils Absolute 0.0 0.0 - 0.1 K/uL    Assessment/Plan: 1. Chronic rhinitis 2. Acute non-recurrent frontal sinusitis  Rx Doxycycline.  Increase fluids.  Rest.  Saline nasal spray.  Probiotic.  Mucinex as directed.  Humidifier in bedroom. Discussed non-drowsy antihistamine.  Call or return to clinic if symptoms are not improving.    Leeanne Rio, PA-C

## 2018-03-11 NOTE — Patient Instructions (Signed)
Start over-the-counter non-drowsy Claritin once daily to help with nasal inflammation and drainage. Keep well-hydrated. Start plain Mucinex 1200 mg twice daily to thin congestion. If not improving over the next 3-4 days, start the antibiotic and take as directed. Things should be better once you get the neck apparatus out.   Please start the Citalopram at home, taking as directed. Follow-up with me in 3-4 weeks.  I will also be talking to Dr. Isidore Moos to work on your pain medication regimen.

## 2018-03-13 ENCOUNTER — Encounter: Payer: Self-pay | Admitting: Radiation Oncology

## 2018-03-13 NOTE — Progress Notes (Signed)
Brandon [name] presents for follow up of radiation completed 03/05/18 to his left tonsil.   Pain issues, if any: He reports pain to the left side of his face at times.  Using a feeding tube?: yes, He is instilling 6 cartons of nutritional supplement and protein supplements.  Weight changes, if any:  Wt Readings from Last 3 Encounters:  03/19/18 163 lb 3.2 oz (74 kg)  03/11/18 161 lb (73 kg)  03/05/18 161 lb 3.2 oz (73.1 kg)   Swallowing issues, if any: He denies difficulty swallowing. He does have difficulty manipulating is tongue.  Smoking or chewing tobacco? No Using fluoride trays daily?  Last ENT visit was on: Dr. Nicolette Bang last on 01/15/18, next appointment 04/01/18 Other notable issues, if any:  He is sleeping well.   BP 123/69 (BP Location: Right Arm, Patient Position: Sitting)   Pulse 65   Temp 98 F (36.7 C) (Oral)   Resp 20   Ht 5' 9.5" (1.765 m)   Wt 163 lb 3.2 oz (74 kg)   SpO2 98%   BMI 23.75 kg/m

## 2018-03-15 DIAGNOSIS — Z930 Tracheostomy status: Secondary | ICD-10-CM | POA: Diagnosis not present

## 2018-03-15 DIAGNOSIS — C099 Malignant neoplasm of tonsil, unspecified: Secondary | ICD-10-CM | POA: Diagnosis not present

## 2018-03-19 ENCOUNTER — Encounter: Payer: Self-pay | Admitting: Radiation Oncology

## 2018-03-19 ENCOUNTER — Other Ambulatory Visit: Payer: Self-pay

## 2018-03-19 ENCOUNTER — Ambulatory Visit
Admission: RE | Admit: 2018-03-19 | Discharge: 2018-03-19 | Disposition: A | Discharge status: Home / Self Care | Payer: Medicare Other | Source: Ambulatory Visit | Attending: Radiation Oncology | Admitting: Radiation Oncology

## 2018-03-19 VITALS — BP 123/69 | HR 65 | Temp 98.0°F | Resp 20 | Ht 69.5 in | Wt 163.2 lb

## 2018-03-19 DIAGNOSIS — C090 Malignant neoplasm of tonsillar fossa: Secondary | ICD-10-CM | POA: Diagnosis not present

## 2018-03-19 DIAGNOSIS — Z923 Personal history of irradiation: Secondary | ICD-10-CM | POA: Diagnosis not present

## 2018-03-19 DIAGNOSIS — Z7982 Long term (current) use of aspirin: Secondary | ICD-10-CM | POA: Insufficient documentation

## 2018-03-19 DIAGNOSIS — Z79899 Other long term (current) drug therapy: Secondary | ICD-10-CM | POA: Insufficient documentation

## 2018-03-19 HISTORY — DX: Personal history of irradiation: Z92.3

## 2018-03-19 NOTE — Progress Notes (Signed)
Radiation Oncology         [phone] ________________________________  Name: Brandon [name] MRN: [id number]  Date: 03/19/2018  DOB: [birth year]  Follow-Up Visit Note  CC: Lebron Conners, MD  Diagnosis and Prior Radiotherapy:       ICD-10-CM   1. 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 CT Chest W Contrast    BUN & Creatinine ([facility])    CT Soft Tissue Neck W Contrast  Cancer Staging Cancer of tonsillar fossa (HCC) Staging form: Pharynx - HPV-Mediated Oropharynx, AJCC 8th Edition - Clinical: Stage I (cT2, cN0, cM0, p16+) - Signed by Tish Men, MD on 11/06/2017 - Pathologic: Stage II (pT3, pN0, cM0, p16+) - Signed by Eppie Gibson, MD on 01/01/2018  Radiation treatment dates:   01/16/2018-03/05/2018  Site/dose:  Left tonsil tumor bed and left neck nodes, total dose 66 Gy in 33 fractions   CHIEF COMPLAINT:  Here for follow-up and surveillance of throat cancer  Narrative:  The patient returns today for routine follow-up.  He is doing relatively well.  He is taking his nutrition by PEG tube.  His weight is stable.  He is able to swallow water.  He still has issues with tongue mobility.  He is eager to speak with Dr. Nicolette Bang about whether procedure can be done to affect the tethering in the back of his tongue.  He continues to have a trach and hopes to get this removed when he sees him at the end of the month.  Overall he is feeling better.  He no longer needs the suction device for secretions                    ALLERGIES:  is allergic to celebrex [celecoxib].  Meds: Current Outpatient Medications  Medication Sig Dispense Refill  . amLODipine (NORVASC) 10 MG tablet Take 1 tablet (10 mg total) by mouth daily. 90 tablet 3  . aspirin 81 MG EC tablet Take 81 mg by mouth at bedtime.     . clonazePAM (KLONOPIN) 0.5 MG tablet TAKE 1 TABLET(0.5 MG) BY MOUTH TWICE DAILY AS NEEDED FOR ANXIETY 20 tablet 0  . doxycycline (VIBRAMYCIN) 100 MG capsule Take 1 capsule (100 mg total)  by mouth 2 (two) times daily. 14 capsule 0  . isosorbide mononitrate (IMDUR) 30 MG 24 hr tablet TAKE 1 TABLET(30 MG) BY MOUTH AT BEDTIME 90 tablet 0  . metoprolol tartrate (LOPRESSOR) 25 MG tablet Take 0.5 tablets (12.5 mg total) by mouth 2 (two) times daily. 30 tablet 5  . Nutritional Supplements (HIGH-PROTEIN NUTRITIONAL SHAKE PO) Take by mouth.    . Oxycodone HCl 10 MG TABS Take one tablet by mouth every 3-4 hours PRN pain. 30 tablet 0  . pantoprazole (PROTONIX) 40 MG tablet Take 1 tablet (40 mg total) by mouth daily. 30 tablet 3  . atorvastatin (LIPITOR) 40 MG tablet Take 1 tablet (40 mg total) by mouth daily. 90 tablet 3  . lisinopril (PRINIVIL,ZESTRIL) 10 MG tablet Take 1 tablet (10 mg total) by mouth daily. (Patient not taking: Reported on 03/11/2018) 90 tablet 3   No current facility-administered medications for this encounter.     Physical Findings: The patient is in no acute distress. Patient is alert and oriented. Wt Readings from Last 3 Encounters:  03/19/18 163 lb 3.2 oz (74 kg)  03/11/18 161 lb (73 kg)  03/05/18 161 lb 3.2 oz (73.1 kg)    height is 5' 9.5" (1.765 m) and weight  is 163 lb 3.2 oz (74 kg). His oral temperature is 98 F (36.7 C). His blood pressure is 123/69 and his pulse is 65. His respiration is 20 and oxygen saturation is 98%. .  General: Alert and oriented, in no acute distress HEENT: Head is normocephalic. Extraocular movements are intact. Oropharynx is notable for patchy mucositis resolving  neck: Neck is notable for no palpable adenopathy.  Trach intact Skin: Skin in treatment fields shows satisfactory healing / some dryness Extremities: No cyanosis or edema. Lymphatics: see Neck Exam Psychiatric: Judgment and insight are intact. Affect is appropriate.   Lab Findings: Lab Results  Component Value Date   WBC 5.3 02/17/2018   HGB 12.8 (L) 02/17/2018   HCT 38.2 (L) 02/17/2018   MCV 82.1 02/17/2018   PLT 175.0 02/17/2018    Lab Results  Component  Value Date   TSH 0.725 11/06/2017    Radiographic Findings: No results found.  Impression/Plan:    1) Head and Neck Cancer Status: Healing from radiotherapy  2) Nutritional Status: No acute issues PEG tube: Using  3) Risk Factors: The patient has been educated about risk factors including alcohol and tobacco abuse; they understand that avoidance of alcohol and tobacco is important to prevent recurrences as well as other cancers  4) Swallowing: Functional with water  5) Thyroid function: Check annually Lab Results  Component Value Date   TSH 0.725 11/06/2017    7) Other: f/up as scheduled with ENT  8) Follow-up in end of April after CT scans to restage the neck and chest.  I will give him the results then.  He is pleased with this plan    _____________________________________   Eppie Gibson, MD

## 2018-03-20 ENCOUNTER — Telehealth (HOSPITAL_COMMUNITY): Payer: Self-pay

## 2018-03-20 NOTE — Telephone Encounter (Signed)
Collaboration of care form faxed back to [facility] attn Conan Bowens with DM signature with complete last note from pt on 05/24/17, including all procedures from this dept

## 2018-03-22 DIAGNOSIS — Z930 Tracheostomy status: Secondary | ICD-10-CM | POA: Diagnosis not present

## 2018-03-22 DIAGNOSIS — C099 Malignant neoplasm of tonsil, unspecified: Secondary | ICD-10-CM | POA: Diagnosis not present

## 2018-03-24 ENCOUNTER — Encounter: Payer: Self-pay | Admitting: Radiation Oncology

## 2018-03-24 ENCOUNTER — Ambulatory Visit: Payer: Medicare Other

## 2018-03-24 DIAGNOSIS — R1312 Dysphagia, oropharyngeal phase: Secondary | ICD-10-CM | POA: Diagnosis not present

## 2018-03-24 NOTE — Patient Instructions (Signed)
Small bites and sips HARD swallows when you eat and drink Alternate food/liquid 1:1 ratio After each sequence of bite/swallow, STRONG throat clear and reswallow  HARD

## 2018-03-24 NOTE — Progress Notes (Signed)
I met with patient's wife today and gave her a [place] financial assistance application.

## 2018-03-24 NOTE — Therapy (Signed)
[facility] [address] [place], [place], [address] Phone: [phone]   Fax:  [phone]  Speech Language Pathology Treatment  Patient Details  Name: Brandon [name] MRN: [id number] Date of Birth: 07/25/1940 Referring Provider (SLP): Eppie Gibson, MD   Encounter Date: 03/24/2018  End of Session - 03/24/18 1155    Visit Number  3    Number of Visits  8    Date for SLP Re-Evaluation  05/19/18    SLP Start Time  15    SLP Stop Time   1140    SLP Time Calculation (min)  40 min    Activity Tolerance  Patient tolerated treatment well       Past Medical History:  Diagnosis Date  . Aortic stenosis    mild echocardiogram 8/09 EF 60%, showed no regional wall motion abnml, mild LVH, mod focal basal septal hypertrophy and mild dyastolic dysfunction. 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 mean gradient across aortic valve was 8 mmHG. also mild L atrial enlargement and normal RV size and function. minimal AS on LHC in 7/10.   . Arthritis    "all over" (07/19/2016)  . BPH (benign prostatic hypertrophy)   . CAD (coronary artery disease)    LCH (7/10) totally occluded proximal RCA with very robuse L to R collaterals, 50% proximal LAD stenosis, EF 65%, medical management.    . Chicken pox   . Chronic lower back pain    s/p surgical fusion  . Diverticulosis   . Esophageal stricture   . GERD (gastroesophageal reflux disease)   . Heart murmur   . Hepatitis B 1984  . Hiatal hernia   . History of radiation therapy 01/16/18- 03/05/18   Left Tonsil, 66 Gy in 33 fractions to high risk nodal echelons.   Marland Kitchen HLD (hyperlipidemia)   . HTN (hypertension)   . 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 Osteoarthritis   . TIA (transient ischemic attack) 1990s   hx  . tonsillar ca dx'd 11/2017  . Tubular adenoma of colon 2009    Past Surgical History:  Procedure Laterality Date  . BACK SURGERY    .  CATARACT EXTRACTION W/ INTRAOCULAR LENS  IMPLANT, BILATERAL Bilateral 01/2012 - 02/2012  . DIRECT LARYNGOSCOPY Left 10/09/2017   Procedure: DIRECT LARYNGOSCOPY WITH BOPSY;  Surgeon: Jodi Marble, MD;  Location: [employer] [facility];  Service: ENT;  Laterality: Left;  . ESOPHAGOGASTRODUODENOSCOPY (EGD) WITH ESOPHAGEAL DILATION     "couple times" (07/19/2016)  . ESOPHAGOSCOPY Left 10/09/2017   Procedure: ESOPHAGOSCOPY;  Surgeon: Jodi Marble, MD;  Location: [facility];  Service: ENT;  Laterality: Left;  . IR GASTROSTOMY TUBE MOD SED  01/08/2018  . IR THORACENTESIS ASP PLEURAL SPACE W/IMG GUIDE  07/19/2016  . LAPAROSCOPIC CHOLECYSTECTOMY  1994  . LUMBAR DISC SURGERY  05/1996   L4-5; Dr. Sherwood Gambler   . LUMBAR LAMINECTOMY/DECOMPRESSION MICRODISCECTOMY  10/2002   L3-4. Dr. Sherwood Gambler  . MULTIPLE TOOTH EXTRACTIONS  1980s  . PARTIAL GLOSSECTOMY  12/02/2017   Dr. Nicolette Bang- [facility]  . pharyngoplasty for closure of tingue base defect  12/02/2017   Dr. Nicolette Bang- [facility]  . PICC LINE INSERTION  07/15/2016  . POSTERIOR LUMBAR FUSION  09/1996   Ray cage, L4-5 Dr. Rita Ohara  . PROSTATE BIOPSY  ~ 2017  . radical tonsillectomy Left 12/02/2017   Dr. [facility] at [facility]  . RIGID BRONCHOSCOPY Left 10/09/2017   Procedure: RIGID BRONCHOSCOPY;  Surgeon:  Jodi Marble, MD;  Location: [facility];  Service: ENT;  Laterality: Left;  . TRACHEOSTOMY  12/02/2017   Dr. Nicolette Bang- [facility]    There were no vitals filed for this visit.  Subjective Assessment - 03/24/18 1059    Subjective  Pt with Dr. Nicolette Bang 04-01-18 - hopes to get trach out.    Currently in Pain?  No/denies            ADULT SLP TREATMENT - 03/24/18 1059      General Information   Behavior/Cognition  Alert;Cooperative;Pleasant mood      Treatment Provided   Treatment provided  Dysphagia      Dysphagia Treatment   Temperature Spikes Noted  No    Respiratory Status  Room air    Oral Cavity - Dentition  Missing dentition     Treatment Methods  Skilled observation;Therapeutic exercise;Compensation strategy training;Patient/caregiver education    Patient observed directly with PO's  Yes    Type of PO's observed  Dysphagia 1 (puree);Thin liquids    Liquids provided via  Cup    Oral Phase Signs & Symptoms  --   pt swishing liquids - suspect oral residue   Pharyngeal Phase Signs & Symptoms  Other (comment)   none-with alternate solid/liquid, hard swallow, throat clear   Type of cueing  Verbal;Visual    Other treatment/comments  Expresses frustration regarding speech and oral structural changes post surgery. SLP encouraged pt to talk with surgeon about these changes. Pt reports trying to eat wet scrambled eggs yesterday without success - "It went all over." Pt had large bites of applesauce on the spoon prior to PO trials, SLP cued pt to take half of that size bite routinely. With liquid wash pt swished and for first two sequences of bite/sip no overt s/s difficuly noted. With 3rd and 4th swallows min throat clearing. SLP told pt to swallow with effort with all swallows and clear throat/reswallow after each sequence of bite/sip. Pt without overt s/s without using chin tuck - hasn't been using chin tuck at home. HEP: pt piecemealing throughout the day - SLP strongly urged pt to copmlete at least one set and encouraged two sets with doing ALL exercises prescribed. Pt req'd min cues rarely today for proper procedure.  Educated pt/wife re: food journal, Dysarthria noted but pt 100% intelligible. Pt told SLP with min A what food journal was for (at end of session).      Assessment / Recommendations / Plan   Plan  Continue with current plan of care      Dysphagia Recommendations   Diet recommendations  Dysphagia 1 (puree);Thin liquid    Liquids provided via  Cup    Medication Administration  Via alternative means    Supervision  Intermittent supervision to cue for compensatory strategies    Compensations  Small  sips/bites;Clear throat after each swallow       SLP Education - 03/24/18 1154    Education Details  swallow precautions, food journal rationale, procedure for HEP, food choices (homogenous soups and WET puree, use gravies and sauces)    Person(s) Educated  Patient;Spouse    Methods  Explanation;Demonstration;Verbal cues    Comprehension  Verbalized understanding;Returned demonstration;Verbal cues required;Need further instruction       SLP Short Term Goals - 03/24/18 1135      SLP SHORT TERM GOAL #1   Title  pt will complete HEP with rare min A over two sessions    Baseline  03-24-18  Time  3    Period  --   visits   Status  On-going      SLP SHORT TERM GOAL #2   Title  pt will follow swallow precautions with POs during ST, with rare min A over two sessions    Time  3    Period  --   visits   Status  On-going      SLP SHORT TERM GOAL #3   Title  pt will tell SLP why he is completing HEP over two sessions, with min questioning cues    Time  3    Period  --   visits   Status  On-going      SLP SHORT TERM GOAL #4   Title  pt will tell SLP rationale for food journal with min questioning cues    Time  1    Period  --   visits   Status  On-going       SLP Long Term Goals - 03/24/18 1158      SLP LONG TERM GOAL #1   Title  pt will follow swallow precautions with POs during ST over two sessions with modified independence    Time  5    Period  --   visits   Status  On-going      SLP LONG TERM GOAL #2   Title  pt will complete HEP with modified independence over two sessions    Time  5    Period  --   visits   Status  On-going      SLP LONG TERM GOAL #3   Title  pt will ID at least 3 overt s/s aspiration PNA with modified independence    Time  5    Period  --   visits   Status  On-going      SLP LONG TERM GOAL #4   Title  pt will tell when he can modify HEP frequency to x3/week, over 3 sessions    Time  7    Period  --   visits   Status  On-going        Plan - 03/24/18 1156    Clinical Impression Statement  Pt has moderate oropharyngeal dysphagia ID'd by fiberendoscopic eval of swallowing (FEES) on 01-15-18 at [facility]. Pt req's min-mod cues rarely for HEP. Food journal was discussed. See skilled intervention for details. Pt req'd cues from SLP to use safe swallow strategies. No overt s/s aspiration PNA today, nor any reported.  Recommended puree at least x3/day and thin with strict adherence to swallow/aspiration precautions. Pt would cont to benefit from skilled ST to address oropharyngeal dysphagia by ensuring proper and accurate completion of HEP as well as correct adherence to swallow precautions.     Speech Therapy Frequency  --   every other week for 12 weeks/90 days   Duration  --   12 weeks (april 13th)   Treatment/Interventions  Aspiration precaution training;Pharyngeal strengthening exercises;Diet toleration management by SLP;Trials of upgraded texture/liquids;Internal/external aids;Patient/family education;Compensatory strategies;SLP instruction and feedback;Environmental controls;Cueing hierarchy    Potential to Achieve Goals  Good    Potential Considerations  Severity of impairments;Ability to learn/carryover information    SLP Home Exercise Plan  reviewed today    Consulted and Agree with Plan of Care  Patient       Patient will benefit from skilled therapeutic intervention in order to improve the following deficits and impairments:   Dysphagia, oropharyngeal phase  Problem List Patient Active Problem List   Diagnosis Date Noted  . 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 Cancer of tonsillar fossa ([place]) 09/20/2017  . Bruising 10/02/2016  . 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 Aneurysm of infrarenal abdominal aorta (HCC) 07/10/2016  . Normocytic anemia 07/10/2016  . Tobacco abuse disorder 01/10/2014  . Seasonal allergies 11/23/2013  . Essential hypertension 05/19/2013  . Osteoarthritis 05/29/2009  . 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  Aortic valve disorder 01/28/2008  . BRAIN STEM STROKE 08/27/2007  . 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 BENIGN PROSTATIC HYPERTROPHY, HX OF 08/27/2007  . Hyperlipidemia 03/19/2007  . Anxiety state 03/19/2007  . GERD 03/19/2007    [facility] ,MS, CCC-SLP  03/24/2018, 11:59 AM  [facility] [address] [place], [place], [address] Phone: [phone]   Fax:  [phone]   Name: AESON SAWYERS MRN: [id number] Date of Birth: 05/01/1940

## 2018-03-25 ENCOUNTER — Other Ambulatory Visit: Payer: Self-pay | Admitting: Physician Assistant

## 2018-03-25 DIAGNOSIS — C090 Malignant neoplasm of tonsillar fossa: Secondary | ICD-10-CM

## 2018-03-25 NOTE — Telephone Encounter (Signed)
Copied from [id number]. Topic: Quick Communication - Rx Refill/Question >> Mar 25, 2018 12:56 PM Carolyn Stare wrote: Medication  Oxycodone HCl 10 MG TABS  Has the patient contacted their pharmacy yes    Preferred Pharmacy    [facility]: Please be advised that RX refills may take up to 3 business days. We ask that you follow-up with your pharmacy.

## 2018-03-25 NOTE — Telephone Encounter (Signed)
This was last prescribed on 02/24/2018 by Eppie Gibson, MD.   Are you taking over medication or will patient need to get this from the other physician?

## 2018-03-26 NOTE — Telephone Encounter (Signed)
I will take back over now that he has completed radiation therapy.  We did discuss weaning down on dose at visit.  Please assess his current level of pain. Would like to consider different type of pain medication.

## 2018-03-26 NOTE — Telephone Encounter (Signed)
Spoke with patient and wife.  He states that his current pain level is about a 9 until he takes the medication, and then once he takes a pain pill it stays at about a 3 until the medication starts wearing off, and the pain scale gradually starts to go up.Brandon [name]

## 2018-03-27 MED ORDER — OXYCODONE HCL 10 MG PO TABS: ORAL_TABLET | ORAL | 0 refills | Status: DC

## 2018-03-27 NOTE — Telephone Encounter (Signed)
I will send in a refill of current prescription to take over from Dr. Isidore Moos.  He needs follow-up in 3 weeks so we can reassess pain levels.  We may need to consider a pain medicine specialist if pain levels are not calming down the further out from treatments he gets.

## 2018-04-01 DIAGNOSIS — Z483 Aftercare following surgery for neoplasm: Secondary | ICD-10-CM | POA: Diagnosis not present

## 2018-04-01 DIAGNOSIS — R1312 Dysphagia, oropharyngeal phase: Secondary | ICD-10-CM | POA: Diagnosis not present

## 2018-04-01 DIAGNOSIS — C108 Malignant neoplasm of overlapping sites of oropharynx: Secondary | ICD-10-CM | POA: Diagnosis not present

## 2018-04-01 DIAGNOSIS — Z87891 Personal history of nicotine dependence: Secondary | ICD-10-CM | POA: Diagnosis not present

## 2018-04-01 DIAGNOSIS — Z923 Personal history of irradiation: Secondary | ICD-10-CM | POA: Diagnosis not present

## 2018-04-01 DIAGNOSIS — Z930 Tracheostomy status: Secondary | ICD-10-CM | POA: Diagnosis not present

## 2018-04-07 ENCOUNTER — Encounter: Payer: Self-pay | Admitting: Nutrition

## 2018-04-07 ENCOUNTER — Inpatient Hospital Stay: Payer: Medicare Other | Attending: Hematology | Admitting: Nutrition

## 2018-04-07 ENCOUNTER — Telehealth: Payer: Self-pay | Admitting: Physician Assistant

## 2018-04-07 ENCOUNTER — Ambulatory Visit: Payer: Medicare Other | Attending: Radiation Oncology

## 2018-04-07 DIAGNOSIS — R1312 Dysphagia, oropharyngeal phase: Secondary | ICD-10-CM | POA: Diagnosis not present

## 2018-04-07 NOTE — Therapy (Signed)
[facility] [address] [place], [place], [address] Phone: [phone]   Fax:  [phone]  Speech Language Pathology Treatment  Patient Details  Name: Brandon [name] MRN: [id number] Date of Birth: [birth year] Referring Provider (SLP): Eppie Gibson, MD   Encounter Date: 04/07/2018  End of Session - 04/07/18 1218    Visit Number  4    Number of Visits  8    Date for SLP Re-Evaluation  05/19/18    SLP Start Time  1110    SLP Stop Time   1155    SLP Time Calculation (min)  45 min    Activity Tolerance  Patient tolerated treatment well       Past Medical History:  Diagnosis Date  . Aortic stenosis    mild echocardiogram 8/09 EF 60%, showed no regional wall motion abnml, mild LVH, mod focal basal septal hypertrophy and mild dyastolic dysfunction. 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 mean gradient across aortic valve was 8 mmHG. also mild L atrial enlargement and normal RV size and function. minimal AS on LHC in 7/10.   . Arthritis    "all over" (07/19/2016)  . BPH (benign prostatic hypertrophy)   . CAD (coronary artery disease)    LCH (7/10) totally occluded proximal RCA with very robuse L to R collaterals, 50% proximal LAD stenosis, EF 65%, medical management.    . Chicken pox   . Chronic lower back pain    s/p surgical fusion  . Diverticulosis   . Esophageal stricture   . GERD (gastroesophageal reflux disease)   . Heart murmur   . Hepatitis B 1984  . Hiatal hernia   . History of radiation therapy 01/16/18- 03/05/18   Left Tonsil, 66 Gy in 33 fractions to high risk nodal echelons.   Marland Kitchen HLD (hyperlipidemia)   . HTN (hypertension)   . 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 Osteoarthritis   . TIA (transient ischemic attack) 1990s   hx  . tonsillar ca dx'd 11/2017  . Tubular adenoma of colon 2009    Past Surgical History:  Procedure Laterality Date  . BACK SURGERY    .  CATARACT EXTRACTION W/ INTRAOCULAR LENS  IMPLANT, BILATERAL Bilateral 01/2012 - 02/2012  . DIRECT LARYNGOSCOPY Left 10/09/2017   Procedure: DIRECT LARYNGOSCOPY WITH BOPSY;  Surgeon: Jodi Marble, MD;  Location: [facility];  Service: ENT;  Laterality: Left;  . ESOPHAGOGASTRODUODENOSCOPY (EGD) WITH ESOPHAGEAL DILATION     "couple times" (07/19/2016)  . ESOPHAGOSCOPY Left 10/09/2017   Procedure: ESOPHAGOSCOPY;  Surgeon: Jodi Marble, MD;  Location: [facility];  Service: ENT;  Laterality: Left;  . IR GASTROSTOMY TUBE MOD SED  01/08/2018  . IR THORACENTESIS ASP PLEURAL SPACE W/IMG GUIDE  07/19/2016  . LAPAROSCOPIC CHOLECYSTECTOMY  1994  . LUMBAR DISC SURGERY  05/1996   L4-5; Dr. Sherwood Gambler   . LUMBAR LAMINECTOMY/DECOMPRESSION MICRODISCECTOMY  10/2002   L3-4. Dr. Sherwood Gambler  . MULTIPLE TOOTH EXTRACTIONS  1980s  . PARTIAL GLOSSECTOMY  12/02/2017   Dr. Nicolette Bang- [facility]  . pharyngoplasty for closure of tingue base defect  12/02/2017   Dr. Nicolette [facility]- [facility]  . PICC LINE INSERTION  07/15/2016  . POSTERIOR LUMBAR FUSION  09/1996   Ray cage, L4-5 Dr. Rita Ohara  . PROSTATE BIOPSY  ~ 2017  . radical tonsillectomy Left 12/02/2017   Dr[facility]  . RIGID BRONCHOSCOPY Left 10/09/2017   Procedure: RIGID BRONCHOSCOPY;  Surgeon:  Jodi Marble, MD;  Location: [facility];  Service: ENT;  Laterality: Left;  . TRACHEOSTOMY  12/02/2017   Dr. Nicolette [facility]- [facility]    There were no vitals filed for this visit.  Subjective Assessment - 04/07/18 1115    Subjective  Pt now with more taste back, is eating more varied types of food, "Still not much, though."    Patient is accompained by:  Family member   wife   Currently in Pain?  No/denies            ADULT SLP TREATMENT - 04/07/18 1116      General Information   Behavior/Cognition  Alert;Cooperative;Pleasant mood      Treatment Provided   Treatment provided  Dysphagia      Dysphagia Treatment   Temperature Spikes  Noted  No    Respiratory Status  Room air    Oral Cavity - Dentition  Missing dentition    Treatment Methods  Skilled observation;Therapeutic exercise;Compensation strategy training;Patient/caregiver education    Patient observed directly with PO's  Yes    Type of PO's observed  --   puree - soft-solids with thin liquids   Liquids provided via  Cup    Oral Phase Signs & Symptoms  --   oral residue suspected - pt swishing liquids   Pharyngeal Phase Signs & Symptoms  --   none noted in 4 x2bites, sips (8 total)   Other treatment/comments  "I've got a piece of paper telling me something things that I can puree." Pt states it is important to have his tongue surgery before he can "really eat again." SLP told pt he is safe to eat puree/soft solids with using swallow precautions, even without his tongue detached. Pt without overt s/s aspiration with 8 boluses (4 bites purree/4 sips). SLP strongly encouraged pt to consume puree-soft solids/thins x3/day for breakfast, lunch, dinner. Pt req'd modified indpendence today with HEP ([name], Mendelsohn, gauze, effortful swallow, tongue press). Pt was told again about food journal. Pt has appointment dietician immediately following this visit today.       Assessment / Recommendations / Plan   Plan  Continue with current plan of care      Dysphagia Recommendations   Diet recommendations  --   puree/soft solids with thin   Liquids provided via  Cup    Medication Administration  Crushed with puree    Supervision  Intermittent supervision to cue for compensatory strategies    Compensations  Slow rate;Small sips/bites;Multiple dry swallows after each bite/sip;Follow solids with liquid;Effortful swallow      Progression Toward Goals   Progression toward goals  Progressing toward goals       SLP Education - 04/07/18 1216    Education Details  results of FEES, recommendations including precautions, pt needs to eat x3/day at least, food journal    Person(s)  Educated  Patient;Spouse    Methods  Explanation;Demonstration;Verbal cues    Comprehension  Verbalized understanding;Need further instruction;Returned demonstration;Verbal cues required       SLP Short Term Goals - 04/07/18 1150      SLP SHORT TERM GOAL #1   Title  pt will complete HEP with rare min A over two sessions    Status  Achieved      SLP SHORT TERM GOAL #2   Title  pt will follow swallow precautions with POs during ST, with rare min A over two sessions    Time  2    Period  --  visits   Status  On-going      SLP SHORT TERM GOAL #3   Title  pt will tell SLP why he is completing HEP over two sessions, with min questioning cues    Baseline  04-07-18    Time  2    Period  --   visits   Status  On-going      SLP SHORT TERM GOAL #4   Title  pt will tell SLP rationale for food journal with min questioning cues    Status  Achieved       SLP Long Term Goals - 04/07/18 1153      SLP LONG TERM GOAL #1   Title  pt will follow swallow precautions with POs during ST over two sessions with modified independence    Time  4    Period  --   visits   Status  On-going      SLP LONG TERM GOAL #2   Title  pt will complete HEP with modified independence over two sessions    Baseline  04-07-18    Time  4    Period  --   visits   Status  On-going      SLP LONG TERM GOAL #3   Title  pt will ID at least 3 overt s/s aspiration PNA with modified independence    Time  4    Period  --   visits   Status  On-going      SLP LONG TERM GOAL #4   Title  pt will tell when he can modify HEP frequency to x3/week, over 3 sessions    Time  6    Period  --   visits   Status  On-going       Plan - 04/07/18 1218    Clinical Impression Statement  Pt has mild- moderate oropharyngeal dysphagia ID'd by fiberendoscopic eval of swallowing (FEES) on 04-01-18 at [facility] (improvement from previous FEES on 01-15-18). Pt req'd modified independence for HEP. Food journal was again discussed. See  skilled intervention for details. Pt again req'd cues from SLP to use safe swallow strategies of double swallow and to alternate bite/sip. No overt s/s aspiration PNA today, nor any reported.  After FEES on 04-01-18, SLP strongly recommended puree-soft foods with thin liquids at least x3/day to pt/wife, with strict adherence to swallow/aspiration precautions. Pt would cont to benefit from skilled ST to address oropharyngeal dysphagia by ensuring proper and accurate completion of HEP as well as correct adherence to swallow precautions.     Speech Therapy Frequency  --   every other week for 12 weeks/90 days   Duration  --   12 weeks (april 13th)   Treatment/Interventions  Aspiration precaution training;Pharyngeal strengthening exercises;Diet toleration management by SLP;Trials of upgraded texture/liquids;Internal/external aids;Patient/family education;Compensatory strategies;SLP instruction and feedback;Environmental controls;Cueing hierarchy    Potential to Achieve Goals  Good    Potential Considerations  Severity of impairments;Ability to learn/carryover information    SLP Home Exercise Plan  reviewed today    Consulted and Agree with Plan of Care  Patient       Patient will benefit from skilled therapeutic intervention in order to improve the following deficits and impairments:   Dysphagia, oropharyngeal phase    Problem List Patient Active Problem List   Diagnosis Date Noted  . 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 Bruising 10/02/2016  . 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 Aneurysm of  infrarenal abdominal aorta ([place]) 07/10/2016  . Normocytic anemia 07/10/2016  . Tobacco abuse disorder 01/10/2014  . Seasonal allergies 11/23/2013  . Essential hypertension 05/19/2013  . Osteoarthritis 05/29/2009  . 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 Aortic valve disorder 01/28/2008  . BRAIN STEM STROKE 08/27/2007  . 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 BENIGN PROSTATIC  HYPERTROPHY, HX OF 08/27/2007  . Hyperlipidemia 03/19/2007  . Anxiety state 03/19/2007  . GERD 03/19/2007    [facility] ,[place], CCC-SLP  04/07/2018, 12:20 PM  [facility] [address] [place], [place], [address] Phone: [phone]   Fax:  [phone]   Name: Brandon [name] MRN: [id number] Date of Birth: Aug 08, 1940

## 2018-04-07 NOTE — Telephone Encounter (Signed)
Unsure what was discussed in past OV with patient. Document in your folder for review.

## 2018-04-07 NOTE — Patient Instructions (Signed)
EAT! Follow the precautions [name] made for you after your swallow test last week.

## 2018-04-07 NOTE — Progress Notes (Signed)
Nutrition follow-up completed with patient and wife. Patient has completed treatment for left tonsil cancer. Weight has improved and was documented as 168 pounds March 2.  This is increased from 161.2 pounds January 29. Patient is using 6 bottles Osmolite 1.5 with 60 mL free water before and after bolus feedings via PEG. He does continue to use ProMod between feedings. He has been approved to consume pured diet. Reports he has tried scrambled eggs with gravy, banana pudding, ice cream, and spaghetti with sauce. He reports his taste alterations have improved. His trach was removed on the 25th.  Nutrition diagnosis: Inadequate oral intake related to tonsil cancer and associated treatments as evidenced by slow increase in oral intake.  Intervention: Patient was educated to decrease Osmolite 1.5 by1 bottle weekly as he increase his oral intake. Educated patient on strategies for high-calorie high-protein foods. Questions were answered.  Teach back method used. Educated patient and wife to contact me if he has not any difficulty advancing oral diet.  Monitoring, evaluation, goals: Patient will tolerate increased oral intake and decrease in tube feeding while maintaining weight.  Next visit: To be scheduled as needed.  **Disclaimer: This note was dictated with voice recognition software. Similar sounding words can inadvertently be transcribed and this note may contain transcription errors which may not have been corrected upon publication of note.**

## 2018-04-07 NOTE — Telephone Encounter (Signed)
Pt dropped off letter from [facility] regarding medication Oxycodone not being covered by his plan anymore, pt states that he was told that there could be an exception from [employer] to get this to be covered. Copy of letter was placed in bin upfront.

## 2018-04-08 NOTE — Telephone Encounter (Signed)
Reviewed paperwork received. It is not a denial of his medication. The medication is covered by the plan but has limitations on how many tablets can be filled in a 30-day period (Max 180). I have him on 90 per month so we should be fine. Also we will need to work on weaning down on this a bit over the next few months.

## 2018-04-09 ENCOUNTER — Telehealth: Payer: Self-pay

## 2018-04-09 NOTE — Telephone Encounter (Signed)
Copied from [place] [id number]. Topic: General - Call Back - No Documentation >> Apr 09, 2018  4:14 PM Mcneil, Ja-Kwan wrote: Reason for CRM: Pt wife stated they had a missed call from the office. Pt wife requests call back. Cb# [phone]

## 2018-04-09 NOTE — Telephone Encounter (Signed)
Duplicate message. See other message. Patient has been called back

## 2018-04-09 NOTE — Telephone Encounter (Signed)
Called patient and lmovm advising of current pain medication is covered by insurance but is limited to amount received in 30 days. PCP would like to decrease dosage over time CRM created and ok for [facility] nurse triage to give recommendations

## 2018-04-09 NOTE — Telephone Encounter (Signed)
Spoke with patient and advised of PCP recommendations. He is agreeable. He has scheduled an appt to follow up on mood and pain level.

## 2018-04-11 ENCOUNTER — Other Ambulatory Visit: Payer: Self-pay | Admitting: Physician Assistant

## 2018-04-11 DIAGNOSIS — C090 Malignant neoplasm of tonsillar fossa: Secondary | ICD-10-CM

## 2018-04-11 NOTE — Telephone Encounter (Signed)
Last OV: 03/11/2018 (acute),    12/27/2017 Last Filled: 03/27/2018 #30, 0

## 2018-04-11 NOTE — Telephone Encounter (Signed)
Copied from [place] [id number]. Topic: Quick Communication - Rx Refill/Question >> Apr 11, 2018 10:00 AM Andria Frames L wrote: Medication: Oxycodone HCl 10 MG TABS [[id number]]   Has the patient contacted their pharmacy? no Preferred Pharmacy (with phone number or street name):[id number] [place], [place] [phone] (Phone) [phone] (Fax)    Agent: Please be advised that RX refills may take up to 3 business days. We ask that you follow-up with your pharmacy.

## 2018-04-14 MED ORDER — OXYCODONE HCL 10 MG PO TABS: ORAL_TABLET | ORAL | 0 refills | Status: DC

## 2018-04-15 ENCOUNTER — Ambulatory Visit (INDEPENDENT_AMBULATORY_CARE_PROVIDER_SITE_OTHER): Payer: Medicare Other | Admitting: Physician Assistant

## 2018-04-15 ENCOUNTER — Other Ambulatory Visit: Payer: Self-pay

## 2018-04-15 ENCOUNTER — Encounter: Payer: Self-pay | Admitting: Physician Assistant

## 2018-04-15 VITALS — BP 122/62 | HR 67 | Temp 98.3°F | Resp 16 | Ht 67.0 in | Wt 166.0 lb

## 2018-04-15 DIAGNOSIS — F419 Anxiety disorder, unspecified: Secondary | ICD-10-CM | POA: Diagnosis not present

## 2018-04-15 DIAGNOSIS — F418 Other specified anxiety disorders: Secondary | ICD-10-CM | POA: Insufficient documentation

## 2018-04-15 DIAGNOSIS — F329 Major depressive disorder, single episode, unspecified: Secondary | ICD-10-CM | POA: Diagnosis not present

## 2018-04-15 DIAGNOSIS — G8929 Other chronic pain: Secondary | ICD-10-CM | POA: Insufficient documentation

## 2018-04-15 DIAGNOSIS — C090 Malignant neoplasm of tonsillar fossa: Secondary | ICD-10-CM

## 2018-04-15 DIAGNOSIS — F32A Depression, unspecified: Secondary | ICD-10-CM | POA: Insufficient documentation

## 2018-04-15 MED ORDER — CEPHALEXIN 500 MG PO CAPS: 500.0000 mg | ORAL_CAPSULE | Freq: Two times a day (BID) | ORAL | 0 refills | Status: AC

## 2018-04-15 NOTE — Assessment & Plan Note (Signed)
CSC on file. Database reviewed yesterday. No red flags. Will take back over. Will fill when due pending UDS results. UDS obtained today.

## 2018-04-15 NOTE — Progress Notes (Signed)
Patient presents to clinic today for follow-up of mood after initiation of Citalopram 10 mg daily. Patient endorses taking medications as directed and tolerating well. Notes significant improvement in mood and ability to tolerate frustrations. Denies SI/HI. Is happy with current regimen.  Patient also due for repeat UDS since we are taking over his chronic pain medication again, now that he has finished with radiation oncology. Patient with history of SCC left tonsil s/p surgical resection and radiation therapy with tracheotomy (tube recently removed). Patient is currently on a regimen of Oxycodone 10 mg that he take 2 per day. Tylenol on rare occasion for milder pain. Has CSC on file with [place] -- due to update in 09/2018. Due for UDS today. Notes pain is controlled as long as he takes his medication.  Patient and wife are requesting an assessment of patient's drain site. Notes has been healing well and was told it would take 2-3 weeks to heal up due to prior radiation. They are on week 2 of 3. Notes some increased redness and mild tenderness to the area for a couple of days. Denies fever, chills, drainage from site.  Past Medical History:  Diagnosis Date  . Aortic stenosis    mild echocardiogram 8/09 EF 60%, showed no regional wall motion abnml, mild LVH, mod focal basal septal hypertrophy and mild dyastolic dysfunction. 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 mean gradient across aortic valve was 8 mmHG. also mild L atrial enlargement and normal RV size and function. minimal AS on LHC in 7/10.   . Arthritis    "all over" (07/19/2016)  . BPH (benign prostatic hypertrophy)   . CAD (coronary artery disease)    LCH (7/10) totally occluded proximal RCA with very robuse L to R collaterals, 50% proximal LAD stenosis, EF 65%, medical management.    . Chicken pox   . Chronic lower back pain    s/p surgical fusion  . Diverticulosis   . Esophageal stricture   .  GERD (gastroesophageal reflux disease)   . Heart murmur   . Hepatitis B 1984  . Hiatal hernia   . History of radiation therapy 01/16/18- 03/05/18   Left Tonsil, 66 Gy in 33 fractions to high risk nodal echelons.   Marland Kitchen HLD (hyperlipidemia)   . HTN (hypertension)   . 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 Osteoarthritis   . TIA (transient ischemic attack) 1990s   hx  . tonsillar ca dx'd 11/2017  . Tubular adenoma of colon 2009    Current Outpatient Medications on File Prior to Visit  Medication Sig Dispense Refill  . amLODipine (NORVASC) 10 MG tablet Take 1 tablet (10 mg total) by mouth daily. 90 tablet 3  . aspirin 81 MG EC tablet Take 81 mg by mouth at bedtime.     Marland Kitchen atorvastatin (LIPITOR) 40 MG tablet Take 1 tablet (40 mg total) by mouth daily. 90 tablet 3  . clonazePAM (KLONOPIN) 0.5 MG tablet TAKE 1 TABLET(0.5 MG) BY MOUTH TWICE DAILY AS NEEDED FOR ANXIETY 20 tablet 0  . isosorbide mononitrate (IMDUR) 30 MG 24 hr tablet TAKE 1 TABLET(30 MG) BY MOUTH AT BEDTIME 90 tablet 0  . metoprolol tartrate (LOPRESSOR) 25 MG tablet Take 0.5 tablets (12.5 mg total) by mouth 2 (two) times daily. 30 tablet 5  . Nutritional Supplements (HIGH-PROTEIN NUTRITIONAL SHAKE PO) Take by mouth.    . Oxycodone HCl 10 MG TABS Take one tablet by mouth every 3-4 hours PRN  pain. 30 tablet 0  . pantoprazole (PROTONIX) 40 MG tablet Take 1 tablet (40 mg total) by mouth daily. 30 tablet 3  . lisinopril (PRINIVIL,ZESTRIL) 10 MG tablet Take 1 tablet (10 mg total) by mouth daily. (Patient not taking: Reported on 03/11/2018) 90 tablet 3   No current facility-administered medications on file prior to visit.     Allergies  Allergen Reactions  . Celebrex [Celecoxib] Hives and Itching    Family History  Problem Relation Age of Onset  . Heart disease Father 61       Living  . Coronary artery disease Father        CABG  . Alzheimer's disease Mother 82       Deceased  . Arthritis/Rheumatoid Mother   . Stomach cancer Maternal  Uncle   . Brain cancer Maternal Aunt        x2  . Arthritis Brother        DJD  . Obesity Daughter        Had Bypass Sx    Social History   Socioeconomic History  . Marital status: Married    Spouse name: etta  . Number of children: 2  . Years of education: Not on file  . Highest education level: Not on file  Occupational History  . Occupation: retired    Fish farm manager: RETIRED    Comment: disabled due to back problems  Social Needs  . Financial resource strain: Not on file  . Food insecurity:    Worry: Not on file    Inability: Not on file  . Transportation needs:    Medical: No    Non-medical: No  Tobacco Use  . Smoking status: Former Smoker    Packs/day: 1.50    Years: 50.00    Pack years: 75.00    Types: Cigarettes    Last attempt to quit: 09/05/2017    Years since quitting: 0.6  . Smokeless tobacco: Never Used  . Tobacco comment: smoked less than 1 ppd for 40+ years; Using Vapor Cig  Substance and Sexual Activity  . Alcohol use: Not Currently    Alcohol/week: 0.0 standard drinks    Comment: 07/19/2016 "might have a few drinks/year"  . Drug use: No  . Sexual activity: Never  Lifestyle  . Physical activity:    Days per week: Not on file    Minutes per session: Not on file  . Stress: Not on file  Relationships  . Social connections:    Talks on phone: Not on file    Gets together: Not on file    Attends religious service: Not on file    Active member of club or organization: Not on file    Attends meetings of clubs or organizations: Not on file    Relationship status: Not on file  Other Topics Concern  . Not on file  Social History Narrative   Married (3rd), [place]. 2 children from 1st marriage, 4 step children.    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       Cell # [id number]   Review of Systems - See HPI.  All other ROS are negative.  BP 122/62   Pulse 67   Temp 98.3  F (36.8 C) (Oral)   Resp 16   Ht 5\' 7"  (1.702 m)   Wt 166 lb (75.3 kg)   SpO2 96%   BMI 26.00 kg/m  Physical Exam Vitals signs reviewed.  Constitutional:      Appearance: Normal appearance.  HENT:     Head: Normocephalic and atraumatic.   Eyes:     Conjunctiva/sclera: Conjunctivae normal.     Pupils: Pupils are equal, round, and reactive to light.  Neck:     Musculoskeletal: Neck supple.  Cardiovascular:     Rate and Rhythm: Normal rate and regular rhythm.     Heart sounds: Normal heart sounds.  Pulmonary:     Effort: Pulmonary effort is normal.     Breath sounds: Normal breath sounds.  Neurological:     Mental Status: He is alert.  Psychiatric:        Mood and Affect: Mood normal.     Recent Results (from the past 2160 hour(s))  CBC w/Diff     Status: Abnormal   Collection Time: 02/17/18  2:33 PM  Result Value Ref Range   WBC 5.3 4.0 - 10.5 K/uL   RBC 4.66 4.22 - 5.81 Mil/uL   Hemoglobin 12.8 (L) 13.0 - 17.0 g/dL   HCT 38.2 (L) 39.0 - 52.0 %   MCV 82.1 78.0 - 100.0 fl   MCHC 33.5 30.0 - 36.0 g/dL   RDW 14.7 11.5 - 15.5 %   Platelets 175.0 150.0 - 400.0 K/uL   Neutrophils Relative % 64.2 43.0 - 77.0 %   Lymphocytes Relative 12.6 12.0 - 46.0 %   Monocytes Relative 8.4 3.0 - 12.0 %   Eosinophils Relative 14.4 (H) 0.0 - 5.0 %   Basophils Relative 0.4 0.0 - 3.0 %   Neutro Abs 3.4 1.4 - 7.7 K/uL   Lymphs Abs 0.7 0.7 - 4.0 K/uL   Monocytes Absolute 0.4 0.1 - 1.0 K/uL   Eosinophils Absolute 0.8 (H) 0.0 - 0.7 K/uL   Basophils Absolute 0.0 0.0 - 0.1 K/uL   Assessment/Plan: Cancer of tonsillar fossa (HCC) Question mild cellulitis around healing wound. No fluctuance or drainage. Start Keflex BID. He is to follow-up with his specialist this week or early next week regarding this.   Anxiety and depression Doing well. Continue current regimen. Recheck 3 months.  Encounter for chronic pain management CSC on file. Database reviewed yesterday. No red flags. Will take  back over. Will fill when due pending UDS results. UDS obtained today.    Leeanne Rio, PA-C

## 2018-04-15 NOTE — Patient Instructions (Signed)
Please keep hydrated. Continue the Citalopram as directed. I am glad this is helping. I have changed your Oxycodone prescription so that the next time you pick it up, it will be for a full month. Start Tylenol extra strength each morning to stave off jaw pain.  Continue the Oxycodone as directed. If not controlling things, give me a call.

## 2018-04-15 NOTE — Assessment & Plan Note (Signed)
Question mild cellulitis around healing wound. No fluctuance or drainage. Start Keflex BID. He is to follow-up with his specialist this week or early next week regarding this.

## 2018-04-15 NOTE — Assessment & Plan Note (Signed)
Doing well. Continue current regimen. Recheck 3 months.

## 2018-04-19 LAB — PAIN MGMT, PROFILE 8 W/CONF, U
6 Acetylmorphine: NEGATIVE ng/mL (ref <10)
ALPHAHYDROXYMIDAZOLAM: NEGATIVE ng/mL (ref <50)
AMINOCLONAZEPAM: 170 ng/mL — AB (ref <25)
Alcohol Metabolites: NEGATIVE ng/mL (ref <500)
Alphahydroxyalprazolam: NEGATIVE ng/mL (ref <25)
Alphahydroxytriazolam: NEGATIVE ng/mL (ref <50)
Amphetamines: NEGATIVE ng/mL (ref <500)
Benzodiazepines: POSITIVE ng/mL — AB (ref <100)
Buprenorphine, Urine: NEGATIVE ng/mL (ref <5)
Cocaine Metabolite: NEGATIVE ng/mL (ref <150)
Codeine: NEGATIVE ng/mL (ref <50)
Creatinine: 113.5 mg/dL
Hydrocodone: NEGATIVE ng/mL (ref <50)
Hydromorphone: NEGATIVE ng/mL (ref <50)
Hydroxyethylflurazepam: NEGATIVE ng/mL (ref <50)
Lorazepam: NEGATIVE ng/mL (ref <50)
MDMA: NEGATIVE ng/mL (ref <500)
Marijuana Metabolite: NEGATIVE ng/mL (ref <20)
Morphine: NEGATIVE ng/mL (ref <50)
Nordiazepam: NEGATIVE ng/mL (ref <50)
Norhydrocodone: NEGATIVE ng/mL (ref <50)
Noroxycodone: 2463 ng/mL — ABNORMAL HIGH (ref <50)
OXYCODONE: POSITIVE ng/mL — AB (ref <100)
Opiates: NEGATIVE ng/mL (ref <100)
Oxazepam: NEGATIVE ng/mL (ref <50)
Oxidant: NEGATIVE ug/mL (ref <200)
Oxycodone: 1125 ng/mL — ABNORMAL HIGH (ref <50)
Oxymorphone: 1076 ng/mL — ABNORMAL HIGH (ref <50)
TEMAZEPAM: NEGATIVE ng/mL (ref <50)
pH: 6.94 (ref 4.5–9.0)

## 2018-04-21 ENCOUNTER — Ambulatory Visit: Payer: Medicare Other

## 2018-04-21 DIAGNOSIS — C099 Malignant neoplasm of tonsil, unspecified: Secondary | ICD-10-CM | POA: Diagnosis not present

## 2018-04-21 DIAGNOSIS — Z930 Tracheostomy status: Secondary | ICD-10-CM | POA: Diagnosis not present

## 2018-05-03 ENCOUNTER — Other Ambulatory Visit: Payer: Self-pay | Admitting: Physician Assistant

## 2018-05-03 DIAGNOSIS — C090 Malignant neoplasm of tonsillar fossa: Secondary | ICD-10-CM

## 2018-05-07 ENCOUNTER — Encounter: Payer: Self-pay | Admitting: General Practice

## 2018-05-07 NOTE — Progress Notes (Signed)
[facility] CSW Progress Notes  Returned call re COVID 19 needs - "we are fine, plenty of food, just waiting on CT scan."  No concerns expressed.  Edwyna Shell, LCSW Clinical Social Worker Phone:  [phone]

## 2018-05-07 NOTE — Progress Notes (Signed)
[facility] Team contacted patient to assess for food insecurity and other psychosocial needs during current COVID19 pandemic.  Unable to reach, left VM on mobile and home numbers.    Beverely Pace, [place]

## 2018-05-09 ENCOUNTER — Telehealth: Payer: Self-pay | Admitting: *Deleted

## 2018-05-09 ENCOUNTER — Encounter: Payer: Self-pay | Admitting: Physician Assistant

## 2018-05-09 DIAGNOSIS — C090 Malignant neoplasm of tonsillar fossa: Secondary | ICD-10-CM

## 2018-05-09 NOTE — Telephone Encounter (Signed)
Oncology Nurse Navigator Documentation

## 2018-05-12 ENCOUNTER — Ambulatory Visit: Payer: Medicare Other | Attending: Radiation Oncology

## 2018-05-12 DIAGNOSIS — R1312 Dysphagia, oropharyngeal phase: Secondary | ICD-10-CM | POA: Insufficient documentation

## 2018-05-12 MED ORDER — OXYCODONE HCL 10 MG PO TABS: 10.0000 mg | ORAL_TABLET | Freq: Four times a day (QID) | ORAL | 0 refills | Status: DC | PRN

## 2018-05-12 NOTE — Telephone Encounter (Signed)
Indication for chronic opioid: Chronic pain  Medication and dose: Oxycodone 10 mg q 3-4 hrs # pills per month: 30 on 04/14/18 Last UDS date: 04/15/18 Opioid Treatment Agreement signed (Y/N): Yes-04/14/18 Opioid Treatment Agreement last reviewed with patient:   [facility] reviewed this encounter (include red flags):     Please advise

## 2018-05-12 NOTE — Therapy (Signed)
[facility] [address] [place], [place], [address] Phone: [phone]   Fax:  [phone]  Speech Language Pathology Treatment  Patient Details  Name: Brandon [name] MRN: [id number] Date of Birth: 1940-11-01 Referring Provider (SLP): Eppie Gibson, MD   Encounter Date: 05/12/2018  End of Session - 05/12/18 1527    Visit Number  5    Number of Visits  8    Date for SLP Re-Evaluation  05/19/18    SLP Start Time  81    SLP Stop Time   1200    SLP Time Calculation (min)  30 min    Activity Tolerance  Patient tolerated treatment well       Past Medical History:  Diagnosis Date  . Aortic stenosis    mild echocardiogram 8/09 EF 60%, showed no regional wall motion abnml, mild LVH, mod focal basal septal hypertrophy and mild dyastolic dysfunction. 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 mean gradient across aortic valve was 8 mmHG. also mild L atrial enlargement and normal RV size and function. minimal AS on LHC in 7/10.   . Arthritis    "all over" (07/19/2016)  . BPH (benign prostatic hypertrophy)   . CAD (coronary artery disease)    LCH (7/10) totally occluded proximal RCA with very robuse L to R collaterals, 50% proximal LAD stenosis, EF 65%, medical management.    . Chicken pox   . Chronic lower back pain    s/p surgical fusion  . Diverticulosis   . Esophageal stricture   . GERD (gastroesophageal reflux disease)   . Heart murmur   . Hepatitis B 1984  . Hiatal hernia   . History of radiation therapy 01/16/18- 03/05/18   Left Tonsil, 66 Gy in 33 fractions to high risk nodal echelons.   Marland Kitchen HLD (hyperlipidemia)   . HTN (hypertension)   . 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 Osteoarthritis   . TIA (transient ischemic attack) 1990s   hx  . tonsillar ca dx'd 11/2017  . Tubular adenoma of colon 2009    Past Surgical History:  Procedure Laterality Date  . BACK SURGERY    .  CATARACT EXTRACTION W/ INTRAOCULAR LENS  IMPLANT, BILATERAL Bilateral 01/2012 - 02/2012  . DIRECT LARYNGOSCOPY Left 10/09/2017   Procedure: DIRECT LARYNGOSCOPY WITH BOPSY;  Surgeon: Jodi Marble, MD;  Location: [facility];  Service: ENT;  Laterality: Left;  . ESOPHAGOGASTRODUODENOSCOPY (EGD) WITH ESOPHAGEAL DILATION     "couple times" (07/19/2016)  . ESOPHAGOSCOPY Left 10/09/2017   Procedure: ESOPHAGOSCOPY;  Surgeon: Jodi Marble, MD;  Location: [facility];  Service: ENT;  Laterality: Left;  . IR GASTROSTOMY TUBE MOD SED  01/08/2018  . IR THORACENTESIS ASP PLEURAL SPACE W/IMG GUIDE  07/19/2016  . LAPAROSCOPIC CHOLECYSTECTOMY  1994  . LUMBAR DISC SURGERY  05/1996   L4-5; Dr. Sherwood Gambler   . LUMBAR LAMINECTOMY/DECOMPRESSION MICRODISCECTOMY  10/2002   L3-4. Dr. Sherwood Gambler  . MULTIPLE TOOTH EXTRACTIONS  1980s  . PARTIAL GLOSSECTOMY  12/02/2017   Dr. Nicolette [name]- [facility]  . pharyngoplasty for closure of tingue base defect  12/02/2017   Dr. Nicolette [name]- [facility]  . PICC LINE INSERTION  07/15/2016  . POSTERIOR LUMBAR FUSION  09/1996   Ray cage, L4-5 Dr. Rita Ohara  . PROSTATE BIOPSY  ~ 2017  . radical tonsillectomy Left 12/02/2017   Dr. Nicolette [name] at [facility]  . RIGID BRONCHOSCOPY Left 10/09/2017   Procedure: RIGID BRONCHOSCOPY;  Surgeon:  Jodi Marble, MD;  Location: [facility];  Service: ENT;  Laterality: Left;  . TRACHEOSTOMY  12/02/2017   Dr. Nicolette [name]- [facility]    There were no vitals filed for this visit.  Subjective Assessment - 05/12/18 1139    Subjective  Pt eating a wider variety of foods, but reports food lodges behind sutured tongue to lt jaw. Visit done via telephone, per medical director (Dr. Isidore Moos). "I'm doing great!"            ADULT SLP TREATMENT - 05/12/18 1142      General Information   Behavior/Cognition  Alert;Cooperative;Pleasant mood      Treatment Provided   Treatment provided  Dysphagia      Dysphagia Treatment   Temperature Spikes Noted   No    Oral Cavity - Dentition  Missing dentition   reported   Treatment Methods  Patient/caregiver education    Patient observed directly with PO's  No    Other treatment/comments  Pt reports surgeon Brandon [name]) plans to detach lingual structure from lt jaw following PET scan on 05-29-18. Pt taste sensation coming back more since last visit. Pt reports mashing up food with teeth/tongue, or with fork prior to solid food intake. Pt gave example of alternating liquids and solids, with liquids assisting pharyngeal clearance.  At this time SLP reminded/reiterated/re-eduated pt on all swallwoing precautions given to him after his FEES exam at [facility] did not endorse using all precautions so SLP reiterated using each of them for all POs. Pt tells SLP he has not been following full HEP but those exercises that are best for him "with my tongue still attached." SLP reiterated to pt to perform all exercises as best as he can with limitations on lingual ROM. Reminded/re-educated pt about changes to swalow/late effects of radiation therapy on swallowing ability. Pt stated, "I'm getting along alright, [name]" twice during the session today. Pt told SLP he is not keeping a food journal but told SLP his diet choices have widened over the last 2 weeks.       Assessment / Recommendations / Plan   Plan  Continue with current plan of care      Dysphagia Recommendations   Diet recommendations  --   as tolerated   Liquids provided via  Cup    Medication Administration  --   as tolerated   Supervision  Intermittent supervision to cue for compensatory strategies    Compensations  Slow rate;Small sips/bites;Multiple dry swallows after each bite/sip;Follow solids with liquid;Effortful swallow      Progression Toward Goals   Progression toward goals  Progressing toward goals       SLP Education - 05/12/18 1526    Education Details  swallow precautions, doing all of HEP as best he can given his limitations with  lingual ROM    Person(s) Educated  Patient    Methods  Explanation    Comprehension  Verbalized understanding;Need further instruction       SLP Short Term Goals - 05/12/18 1533      SLP SHORT TERM GOAL #1   Title  pt will complete HEP with rare min A over two sessions    Status  Achieved      SLP [place] #2   Title  pt will follow swallow precautions with POs during ST, with rare min A over two sessions    Time  2    Period  --   visits   Status  On-going      SLP SHORT TERM GOAL #3   Title  pt will tell SLP why he is completing HEP over two sessions, with min questioning cues    Baseline  04-07-18    Time  1    Period  --   visits   Status  On-going      SLP SHORT TERM GOAL #4   Title  pt will tell SLP rationale for food journal with min questioning cues    Status  Achieved       SLP Long Term Goals - 05/12/18 1533      SLP LONG TERM GOAL #1   Title  pt will follow swallow precautions with POs during ST over two sessions with modified independence    Time  3    Period  --   visits   Status  On-going      SLP LONG TERM GOAL #2   Title  pt will complete HEP with modified independence over two sessions    Baseline  04-07-18    Time  3    Period  --   visits   Status  On-going      SLP LONG TERM GOAL #3   Title  pt will ID at least 3 overt s/s aspiration PNA with modified independence    Time  3    Period  --   visits   Status  On-going      SLP LONG TERM GOAL #4   Title  pt will tell when he can modify HEP frequency to x3/week, over 3 sessions    Time  5    Period  --   visits   Status  On-going       Plan - 05/12/18 1528    Clinical Impression Statement  Pt visit via telephone at request of medical director Dr. Eppie Gibson. Pt describes his swallowing function as improved this session, reporting eating a much wider variety of foods (lasagna, BBQ, eggs, sausage gravy). Pt endorses not completing all of HEP - SLP reiterated to do all as best he  can given limitations of lingual ROM. SLP re-educated pt re: swallow precautions, pt told SLP he was using alternating bite/sip. See "other treatment/comments" for more details. No overt s/s aspiration reported from pt.  Pt would cont to benefit from skilled ST to address oropharyngeal dysphagia by ensuring proper and accurate completion of HEP as well as correct adherence to swallow precautions. These visits will be conducted via telephone, video call, or in-person- whichever is deemed most appropriate for pt safet, by medical director Dr. Eppie Gibson.     Speech Therapy Frequency  --   every other week for 12 weeks/90 days   Duration  --   12 weeks (april 13th)   Treatment/Interventions  Aspiration precaution training;Pharyngeal strengthening exercises;Diet toleration management by SLP;Trials of upgraded texture/liquids;Internal/external aids;Patient/family education;Compensatory strategies;SLP instruction and feedback;Environmental controls;Cueing hierarchy    Potential to Achieve Goals  Good    Potential Considerations  Severity of impairments;Ability to learn/carryover information    SLP Home Exercise Plan  reviewed today    Consulted and Agree with Plan of Care  Patient       Patient will benefit from skilled therapeutic intervention in order to improve the following deficits and impairments:   Dysphagia, oropharyngeal phase    Problem List Patient Active Problem List   Diagnosis Date Noted  . Encounter for chronic pain management 04/15/2018  . Anxiety and  depression 04/15/2018  . 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 Bruising 10/02/2016  . 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 Aneurysm of infrarenal abdominal aorta (HCC) 07/10/2016  . Normocytic anemia 07/10/2016  . Tobacco abuse disorder 01/10/2014  . Seasonal allergies 11/23/2013  . Essential hypertension 05/19/2013  . Osteoarthritis 05/29/2009  . 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 Aortic valve  disorder 01/28/2008  . BRAIN STEM STROKE 08/27/2007  . 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 BENIGN PROSTATIC HYPERTROPHY, HX OF 08/27/2007  . Hyperlipidemia 03/19/2007  . Anxiety state 03/19/2007  . GERD 03/19/2007    [facility] ,[place], [place]  05/12/2018, 3:34 PM  [facility] [address] [place], [place], [address] Phone: [phone]   Fax:  [phone]   Name: KAREN KINNARD MRN: [id number] Date of Birth: 11-Apr-1940

## 2018-05-23 ENCOUNTER — Encounter: Payer: Self-pay | Admitting: Physician Assistant

## 2018-05-23 ENCOUNTER — Other Ambulatory Visit: Payer: Self-pay

## 2018-05-23 ENCOUNTER — Other Ambulatory Visit: Payer: Self-pay | Admitting: Physician Assistant

## 2018-05-23 ENCOUNTER — Ambulatory Visit (INDEPENDENT_AMBULATORY_CARE_PROVIDER_SITE_OTHER): Payer: Medicare Other | Admitting: Physician Assistant

## 2018-05-23 VITALS — BP 140/79 | HR 83 | Temp 98.7°F | Ht 67.0 in | Wt 165.0 lb

## 2018-05-23 DIAGNOSIS — J302 Other seasonal allergic rhinitis: Secondary | ICD-10-CM | POA: Diagnosis not present

## 2018-05-23 MED ORDER — BUDESONIDE-FORMOTEROL FUMARATE 160-4.5 MCG/ACT IN AERO: 2.0000 | INHALATION_SPRAY | Freq: Two times a day (BID) | RESPIRATORY_TRACT | 0 refills | Status: DC

## 2018-05-23 NOTE — Progress Notes (Signed)
I have discussed the procedure for the virtual visit with the patient who has given consent to proceed with assessment and treatment.   Exa Bomba S Tequan Redmon, CMA     

## 2018-05-23 NOTE — Patient Instructions (Signed)
Instructions sent to MyChart.   Please keep hydrated and continue the Mucinex twice daily. Start the saline nasal rinses 2 x daily. Use Flonase daily after the morning saline nasal rinse.  Start over-the-counter Xyzal  Can use Symbicort as directed right now for allergic reactive airway. Follow-up with myself or Dr. Erik Obey if symptoms are not easing up.

## 2018-05-23 NOTE — Progress Notes (Signed)
Virtual Visit via Video   I connected with patient on 05/23/18 at  9:00 AM EDT by a video enabled telemedicine application and verified that I am speaking with the correct person using two identifiers.  Location patient: Home Location provider: [facility], Office Persons participating in the virtual visit: Patient, Provider, [place] (Patina Moore)  I discussed the limitations of evaluation and management by telemedicine and the availability of in person appointments. The patient expressed understanding and agreed to proceed.  Subjective:   HPI:   Patient presents via Doxy[employer]me today c/o increase nasal congestion and drainage x 1 week. Denies chest congestion, sinus pressure or sinus pain. Notes rhinorrhea and some sneezing. Denies eye symptoms. Denies fever, chills, malaise or fatigue. Denies chest tightness or wheezing. Has been using Flonase daily and Mucinex BID which help some. 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 No longer with tracheostomy in place. Healed well previously.   ROS:   See pertinent positives and negatives per HPI.  Patient Active Problem List   Diagnosis Date Noted  . Encounter for chronic pain management 04/15/2018  . Anxiety and depression 04/15/2018  . 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 Bruising 10/02/2016  . 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 Aneurysm of infrarenal abdominal aorta (HCC) 07/10/2016  . Normocytic anemia 07/10/2016  . Tobacco abuse disorder 01/10/2014  . Seasonal allergies 11/23/2013  . Essential hypertension 05/19/2013  . Osteoarthritis 05/29/2009  . 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 Aortic valve disorder 01/28/2008  . BRAIN STEM STROKE 08/27/2007  . 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 BENIGN PROSTATIC HYPERTROPHY, HX OF 08/27/2007  . Hyperlipidemia 03/19/2007  . Anxiety state 03/19/2007  . GERD 03/19/2007    Social History   Tobacco Use  . Smoking status: Former  Smoker    Packs/day: 1.50    Years: 50.00    Pack years: 75.00    Types: Cigarettes    Last attempt to quit: 09/05/2017    Years since quitting: 0.7  . Smokeless tobacco: Never Used  . Tobacco comment: smoked less than 1 ppd for 40+ years; Using Vapor Cig  Substance Use Topics  . Alcohol use: Not Currently    Alcohol/week: 0.0 standard drinks    Comment: 07/19/2016 "might have a few drinks/year"    Current Outpatient Medications:  .  amLODipine (NORVASC) 10 MG tablet, Take 1 tablet (10 mg total) by mouth daily., Disp: 90 tablet, Rfl: 3 .  aspirin 81 MG EC tablet, Take 81 mg by mouth at bedtime. , Disp: , Rfl:  .  atorvastatin (LIPITOR) 40 MG tablet, Take 1 tablet (40 mg total) by mouth daily., Disp: 90 tablet, Rfl: 3 .  clonazePAM (KLONOPIN) 0.5 MG tablet, TAKE 1 TABLET(0.5 MG) BY MOUTH TWICE DAILY AS NEEDED FOR ANXIETY, Disp: 20 tablet, Rfl: 0 .  isosorbide mononitrate (IMDUR) 30 MG 24 hr tablet, TAKE 1 TABLET(30 MG) BY MOUTH AT BEDTIME, Disp: 90 tablet, Rfl: 0 .  lisinopril (PRINIVIL,ZESTRIL) 10 MG tablet, Take 1 tablet (10 mg total) by mouth daily., Disp: 90 tablet, Rfl: 3 .  metoprolol tartrate (LOPRESSOR) 25 MG tablet, Take 0.5 tablets (12.5 mg total) by mouth 2 (two) times daily., Disp: 30 tablet, Rfl: 5 .  Nutritional Supplements (HIGH-PROTEIN NUTRITIONAL SHAKE PO), Take by mouth., Disp: , Rfl:  .  Oxycodone HCl 10 MG TABS, Take 1 tablet (10 mg total) by mouth every 6 (six) hours as needed., Disp: 45 tablet, Rfl: 0 .  pantoprazole (PROTONIX) 40 MG tablet, Take 1 tablet (40 mg total) by mouth daily., Disp: 30 tablet, Rfl: 3  Allergies  Allergen Reactions  . Celebrex [Celecoxib] Hives and Itching    Objective:   BP 140/79   Pulse 83   Temp 98.7 F (37.1 C) (Oral)   Ht 5\' 7"  (1.702 m)   Wt 165 lb (74.8 kg)   BMI 25.84 kg/m   Patient is well-developed, well-nourished in no acute distress.  Resting comfortably in chair at home.  Head is normocephalic, atraumatic.  No  labored breathing.  Speech is clear and coherent with logical contest.  Patient is alert and oriented at baseline.   Assessment and Plan:   1. Seasonal allergic rhinitis, unspecified trigger Encouraged patient to continue his Flonase once daily but to start a saline rinse BID with first rinse prior to morning Flonase. Continue Mucinex BID with good hydration. Will have him start short course of daily Xyzal. Place humidifier in bedroom. Follow-up with ENT if not improving.     Leeanne Rio, PA-C 05/23/2018  Time spent with the patient: 15 minutes, of which >50% was spent in obtaining information about symptoms, reviewing previous labs, evaluations, and treatments, counseling about condition (please see the discussed topics above), and developing a plan to further investigate it; had a number of questions which I addressed.

## 2018-05-26 ENCOUNTER — Ambulatory Visit: Payer: Self-pay

## 2018-05-29 ENCOUNTER — Ambulatory Visit
Admission: RE | Admit: 2018-05-29 | Discharge: 2018-05-29 | Disposition: A | Discharge status: Home / Self Care | Payer: Medicare Other | Source: Ambulatory Visit | Attending: Radiation Oncology | Admitting: Radiation Oncology

## 2018-05-29 ENCOUNTER — Ambulatory Visit (HOSPITAL_COMMUNITY)
Admission: RE | Admit: 2018-05-29 | Discharge: 2018-05-29 | Disposition: A | Discharge status: Home / Self Care | Payer: Medicare Other | Source: Ambulatory Visit | Attending: Radiation Oncology | Admitting: Radiation Oncology

## 2018-05-29 ENCOUNTER — Ambulatory Visit (HOSPITAL_COMMUNITY): Payer: Medicare Other

## 2018-05-29 ENCOUNTER — Ambulatory Visit (HOSPITAL_COMMUNITY): Admission: RE | Admit: 2018-05-29 | Payer: Medicare Other | Source: Ambulatory Visit

## 2018-05-29 ENCOUNTER — Other Ambulatory Visit: Payer: Self-pay

## 2018-05-29 ENCOUNTER — Encounter (HOSPITAL_COMMUNITY): Payer: Self-pay

## 2018-05-29 DIAGNOSIS — Z79899 Other long term (current) drug therapy: Secondary | ICD-10-CM | POA: Diagnosis not present

## 2018-05-29 DIAGNOSIS — C090 Malignant neoplasm of tonsillar fossa: Secondary | ICD-10-CM | POA: Insufficient documentation

## 2018-05-29 DIAGNOSIS — Z510 Encounter for antineoplastic radiation therapy: Secondary | ICD-10-CM | POA: Insufficient documentation

## 2018-05-29 DIAGNOSIS — Z85818 Personal history of malignant neoplasm of other sites of lip, oral cavity, and pharynx: Secondary | ICD-10-CM | POA: Diagnosis not present

## 2018-05-29 DIAGNOSIS — G8929 Other chronic pain: Secondary | ICD-10-CM | POA: Insufficient documentation

## 2018-05-29 DIAGNOSIS — Z1329 Encounter for screening for other suspected endocrine disorder: Secondary | ICD-10-CM | POA: Diagnosis not present

## 2018-05-29 LAB — BUN & CREATININE (CHCC)
BUN: 14 mg/dL (ref 8–23)
Creatinine: 0.82 mg/dL (ref 0.61–1.24)
GFR, Est AFR Am: >60 mL/min (ref >60)
GFR, Estimated: >60 mL/min (ref >60)

## 2018-05-29 MED ORDER — SODIUM CHLORIDE (PF) 0.9 % IJ SOLN
INTRAMUSCULAR | Status: AC
  Filled 2018-05-29: qty 50

## 2018-05-29 MED ORDER — IOHEXOL 300 MG/ML  SOLN
75.0000 mL | Freq: Once | INTRAMUSCULAR | Status: AC | PRN
  Administered 2018-05-29: 13:00:00 75 mL via INTRAVENOUS

## 2018-05-30 ENCOUNTER — Encounter: Payer: Self-pay | Admitting: *Deleted

## 2018-05-30 ENCOUNTER — Ambulatory Visit
Admission: RE | Admit: 2018-05-30 | Discharge: 2018-05-30 | Disposition: A | Discharge status: Home / Self Care | Payer: Medicare Other | Source: Ambulatory Visit | Attending: Radiation Oncology | Admitting: Radiation Oncology

## 2018-05-30 ENCOUNTER — Encounter: Payer: Self-pay | Admitting: Radiation Oncology

## 2018-05-30 DIAGNOSIS — C090 Malignant neoplasm of tonsillar fossa: Secondary | ICD-10-CM

## 2018-05-30 DIAGNOSIS — Z08 Encounter for follow-up examination after completed treatment for malignant neoplasm: Secondary | ICD-10-CM | POA: Diagnosis not present

## 2018-05-30 DIAGNOSIS — Z931 Gastrostomy status: Secondary | ICD-10-CM | POA: Diagnosis not present

## 2018-05-30 DIAGNOSIS — Z1329 Encounter for screening for other suspected endocrine disorder: Secondary | ICD-10-CM

## 2018-05-30 NOTE — Progress Notes (Signed)
Radiation Oncology         [phone] ________________________________  Name: Brandon [name] MRN: [id number]  Date: 05/30/2018  DOB: Aug 28, 1940  Follow-Up telephone Note  CC: Lebron Conners, MD  Diagnosis and Prior Radiotherapy:       ICD-10-CM   1. 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 TSH  2. Screening for hypothyroidism Z13.29 TSH  Cancer Staging Cancer of tonsillar fossa ([place]) Staging form: Pharynx - HPV-Mediated Oropharynx, AJCC 8th Edition - Clinical: Stage I (cT2, cN0, cM0, p16+) - Signed by Tish Men, MD on 11/06/2017 - Pathologic: Stage II (pT3, pN0, cM0, p16+) - Signed by Eppie Gibson, MD on 01/01/2018  Radiation treatment dates:   01/16/2018-03/05/2018  Site/dose:  Left tonsil tumor bed and left neck nodes, total dose 66 Gy in 33 fractions   CHIEF COMPLAINT:   throat cancer  Narrative:  The patient and I talked via telephone due to the pandemic today, and Gayleen Orem, RN, our Head and Neck Oncology Navigator and the patient's wife were on the call too. Patient couldn't navigate [employer] so we did a phone call.    I have reviewed his images. He underwent neck and chest CT scans yesterday, on 05/29/2018. The chest CT showed no evidence of thoracic metastatic disease or primary thoracic malignancy. The neck CT showed no evidence of recurrent tumor.  He is doing relatively well.  He taking 50% of his nutrition by PEG tube.  His weight is stable. He has no major complaints.  He anticipates a procedure to release the tissue in the back of his tongue with Dr Nicolette Bang in the future. No pain, sores, or masses reported.              ALLERGIES:  is allergic to celebrex [celecoxib].  Meds: Current Outpatient Medications  Medication Sig Dispense Refill  . amLODipine (NORVASC) 10 MG tablet Take 1 tablet (10 mg total) by mouth daily. 90 tablet 3  . aspirin 81 MG EC tablet Take 81 mg by mouth at bedtime.     Marland Kitchen atorvastatin (LIPITOR) 40 MG tablet Take 1 tablet  (40 mg total) by mouth daily. 90 tablet 3  . budesonide-formoterol (SYMBICORT) 160-4.5 MCG/ACT inhaler Inhale 2 puffs into the lungs 2 (two) times daily. 10.2 g 0  . clonazePAM (KLONOPIN) 0.5 MG tablet TAKE 1 TABLET(0.5 MG) BY MOUTH TWICE DAILY AS NEEDED FOR ANXIETY 20 tablet 0  . isosorbide mononitrate (IMDUR) 30 MG 24 hr tablet TAKE 1 TABLET(30 MG) BY MOUTH AT BEDTIME 90 tablet 0  . lisinopril (PRINIVIL,ZESTRIL) 10 MG tablet Take 1 tablet (10 mg total) by mouth daily. 90 tablet 3  . metoprolol tartrate (LOPRESSOR) 25 MG tablet Take 0.5 tablets (12.5 mg total) by mouth 2 (two) times daily. 30 tablet 5  . Nutritional Supplements (HIGH-PROTEIN NUTRITIONAL SHAKE PO) Take by mouth.    . Oxycodone HCl 10 MG TABS Take 1 tablet (10 mg total) by mouth every 6 (six) hours as needed. 45 tablet 0  . pantoprazole (PROTONIX) 40 MG tablet Take 1 tablet (40 mg total) by mouth daily. 30 tablet 3   No current facility-administered medications for this encounter.     Physical Findings:   Wt Readings from Last 3 Encounters:  05/23/18 165 lb (74.8 kg)  04/15/18 166 lb (75.3 kg)  04/07/18 168 lb (76.2 kg)    vitals were not taken for this visit. .    Lab Findings: Lab Results  Component Value Date  WBC 5.3 02/17/2018   HGB 12.8 (L) 02/17/2018   HCT 38.2 (L) 02/17/2018   MCV 82.1 02/17/2018   PLT 175.0 02/17/2018    Lab Results  Component Value Date   TSH 0.725 11/06/2017    Radiographic Findings: Ct Soft Tissue Neck W Contrast  Result Date: 05/29/2018 CLINICAL DATA:  Left tonsillar cancer status post resection. Radiation treatment 01/16/2018-03/05/2018. EXAM: CT NECK WITH CONTRAST TECHNIQUE: Multidetector CT imaging of the neck was performed using the standard protocol following the bolus administration of intravenous contrast. CONTRAST:  89mL OMNIPAQUE IOHEXOL 300 MG/ML  SOLN COMPARISON:  12/22/2017 FINDINGS: Pharynx and larynx: Sequelae of left tonsillectomy, partial glossectomy, and left neck  dissection are again identified with unchanged tongue deviation. No recurrent enhancing mass is seen in the left lateral oropharynx or tongue base region. There is diffuse submucosal edema including asymmetric left-sided epiglottic involvement consistent with interval radiation therapy. The airway remains patent. There is mild retropharyngeal edema without fluid collection. Tracheostomy tube has been removed. Salivary glands: Left submandibular gland resection with similar appearance of non-masslike soft tissue thickening in the posterior left submandibular space, likely postoperative. Unremarkable right submandibular and bilateral parotid glands. Thyroid: Unremarkable. Lymph nodes: Obscured fascial planes in the left neck due to postsurgical changes and postradiation changes no enlarged or suspicious lymph nodes identified in the neck. Vascular: Prior left internal jugular vein ligation. Atherosclerotic calcification of the aortic arch and carotid arteries. Limited intracranial: Unremarkable. Visualized orbits: Bilateral cataract extraction. Mastoids and visualized paranasal sinuses: Minimal left maxillary sinus mucosal thickening. Clear mastoid air cells. Skeleton: No suspicious osseous lesion. Moderate cervical disc and facet degeneration. Upper chest: Reported separately. Other: None. IMPRESSION: Post treatment changes in the neck without evidence of recurrent tumor. Electronically Signed   By: Logan Bores M.D.   On: 05/29/2018 14:44   Ct Chest W Contrast  Result Date: 05/29/2018 CLINICAL DATA:  Tonsillar cancer diagnosed in 2019 with left neck surgery. Radiation therapy completed. EXAM: CT CHEST WITH CONTRAST TECHNIQUE: Multidetector CT imaging of the chest was performed during intravenous contrast administration. CONTRAST:  36mL OMNIPAQUE IOHEXOL 300 MG/ML  SOLN COMPARISON:  Chest radiographs 07/19/2016.  Abdominal CT 02/26/2017. FINDINGS: Neck findings dictated separately. Cardiovascular: Extensive  atherosclerosis of the aorta, great vessels and coronary arteries. No acute vascular findings. There are extensive calcifications of the aortic valve. The heart size is normal. There is no pericardial effusion. Mediastinum/Nodes: There are no enlarged mediastinal, hilar or axillary lymph nodes. Small hiatal hernia. The trachea and thyroid gland appear unremarkable. Lungs/Pleura: There is no pleural effusion. Mild central airway thickening. The lungs are otherwise clear without suspicious pulmonary nodules. 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 The liver appears more homogeneous on today's study without focal abnormality. Percutaneous G-tube in diffuse vascular calcifications are noted. Musculoskeletal/Chest wall: There is no chest wall mass or suspicious osseous finding. IMPRESSION: 1. No evidence of thoracic metastatic disease. No evidence of primary thoracic malignancy. 2. Coronary and Aortic Atherosclerosis (ICD10-I70.0). Prominent aortic valvular calcifications. 3. Neck CT findings dictated separately. Electronically Signed   By: Richardean Sale M.D.   On: 05/29/2018 21:00    Impression/Plan:    1) Head and Neck Cancer Status: NED  2) Nutritional Status: No acute issues PEG tube: Using - pt will wean down and possibly be ready for removal this summer  3) Swallowing: Functional - doing SLP exercises  5) Thyroid function: Check annually Lab Results  Component Value Date   TSH  0.725 11/06/2017    7) Other: f/up as scheduled with ENT (May)  8) Follow-up in August with me   This encounter was provided by phone as he couldn't manage [employer]. The patient has given verbal consent for this type of encounter and has been advised to only accept a meeting of this type in a secure network environment. The time spent during this encounter was 23 minutes. The attendants for this meeting include Eppie Gibson  and Virgel Paling.  During the  encounter, Eppie Gibson was located at [facility]iation [facility] Department.  Brandon [name] was located at home.  ____________________________   Eppie Gibson, MD  This document serves as a record of services personally performed by Eppie Gibson, MD. It was created on her behalf by Wilburn Mylar, a trained medical scribe. The creation of this record is based on the scribe's personal observations and the provider's statements to them. This document has been checked and approved by the attending provider.

## 2018-05-31 NOTE — Progress Notes (Signed)
Oncology Nurse Navigator Documentation  Participated in teleconference follow-up appt with Dr. Isidore Moos, Mr. Maertens and his wife to discuss results of 4/23 CT Neck and CT Chest. Mr. 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 They voiced understanding scans indicated NED. He reported:  Continuing though resolving thickened saliva.  4/29 appt with ENT Nicolette Bang to discuss minor post-surgical surgery to correct scar tissue which has affected tongue mobilityContinued use of PEG for some nutritional supplement though eating soft foods and drinking "lots of water" wo/ difficulty. He was encouraged to discuss plan for PEG removal with Dr. Nicolette Bang since placed at [facility].  Maintaining weight at 166-168 lbs.  Prominent anterior neck post-RT lymphedema.  He noted he conducts self massage.  They voiced understanding:  Dr. Isidore Moos will place PT referral of June appt to further address lymphedema.  Will RTC in August to see Dr. Isidore Moos. I encouraged them to give me a call with needs/concerns.  Gayleen Orem, RN, BSN Head & Neck Oncology Nurse [place] at [place] [phone]

## 2018-06-02 ENCOUNTER — Telehealth: Payer: Self-pay | Admitting: *Deleted

## 2018-06-02 NOTE — Telephone Encounter (Signed)
CALLED PATIENT TO INFORM OF LAB AND FU ON 09-17-18, SPOKE WITH PATIENT AND HE IS AWARE OF THESE APPTS.

## 2018-06-04 DIAGNOSIS — Z9889 Other specified postprocedural states: Secondary | ICD-10-CM | POA: Diagnosis not present

## 2018-06-04 DIAGNOSIS — Q381 Ankyloglossia: Secondary | ICD-10-CM | POA: Diagnosis not present

## 2018-06-04 DIAGNOSIS — C108 Malignant neoplasm of overlapping sites of oropharynx: Secondary | ICD-10-CM | POA: Diagnosis not present

## 2018-06-04 DIAGNOSIS — Z08 Encounter for follow-up examination after completed treatment for malignant neoplasm: Secondary | ICD-10-CM | POA: Diagnosis not present

## 2018-06-04 DIAGNOSIS — Z85810 Personal history of malignant neoplasm of tongue: Secondary | ICD-10-CM | POA: Diagnosis not present

## 2018-06-04 DIAGNOSIS — R633 Feeding difficulties: Secondary | ICD-10-CM | POA: Diagnosis not present

## 2018-06-04 DIAGNOSIS — R1312 Dysphagia, oropharyngeal phase: Secondary | ICD-10-CM | POA: Diagnosis not present

## 2018-06-04 DIAGNOSIS — K0510 Chronic gingivitis, plaque induced: Secondary | ICD-10-CM | POA: Diagnosis not present

## 2018-06-06 ENCOUNTER — Other Ambulatory Visit: Payer: Self-pay | Admitting: Emergency Medicine

## 2018-06-06 DIAGNOSIS — F419 Anxiety disorder, unspecified: Principal | ICD-10-CM

## 2018-06-06 DIAGNOSIS — F329 Major depressive disorder, single episode, unspecified: Secondary | ICD-10-CM

## 2018-06-06 DIAGNOSIS — F32A Depression, unspecified: Secondary | ICD-10-CM

## 2018-06-06 DIAGNOSIS — C090 Malignant neoplasm of tonsillar fossa: Secondary | ICD-10-CM

## 2018-06-06 MED ORDER — CLONAZEPAM 0.5 MG PO TABS: ORAL_TABLET | ORAL | 0 refills | Status: DC

## 2018-06-06 MED ORDER — OXYCODONE HCL 10 MG PO TABS: 10.0000 mg | ORAL_TABLET | Freq: Four times a day (QID) | ORAL | 0 refills | Status: DC | PRN

## 2018-06-06 MED ORDER — CITALOPRAM HYDROBROMIDE 20 MG PO TABS: 20.0000 mg | ORAL_TABLET | Freq: Every day | ORAL | 3 refills | Status: DC

## 2018-06-09 ENCOUNTER — Telehealth: Payer: Self-pay | Admitting: *Deleted

## 2018-06-09 ENCOUNTER — Ambulatory Visit: Payer: Medicare Other | Attending: Radiation Oncology

## 2018-06-09 ENCOUNTER — Other Ambulatory Visit: Payer: Self-pay | Admitting: Radiation Oncology

## 2018-06-09 DIAGNOSIS — R293 Abnormal posture: Secondary | ICD-10-CM | POA: Insufficient documentation

## 2018-06-09 DIAGNOSIS — I890 Lymphedema, not elsewhere classified: Secondary | ICD-10-CM | POA: Diagnosis not present

## 2018-06-09 DIAGNOSIS — R1312 Dysphagia, oropharyngeal phase: Secondary | ICD-10-CM | POA: Insufficient documentation

## 2018-06-09 DIAGNOSIS — L599 Disorder of the skin and subcutaneous tissue related to radiation, unspecified: Secondary | ICD-10-CM | POA: Diagnosis not present

## 2018-06-09 NOTE — Telephone Encounter (Signed)
Oncology Nurse Navigator Documentation  Called Mr. Jilek to remind him to expect call from [place] between 12:00 - 1:00 today.  He voiced understanding. Mr. Paris informed surgery to detach tongue from gum will be conducted in June per his 4/29 follow-up with.Dr. Nicolette Bang. I encouraged him to call me with needs/concerns.  Gayleen Orem, RN, BSN Head & Neck Oncology Nurse [place] at [place] [phone]

## 2018-06-09 NOTE — Therapy (Signed)
[facility] [address] [place], [place], [address] Phone: [phone]   Fax:  [phone]  Speech Language Pathology Treatment  Patient Details  Name: Brandon [name] MRN: [id number] Date of Birth: January 01, 1941 Referring Provider (SLP): Eppie Gibson, MD   Encounter Date: 06/09/2018  End of Session - 06/09/18 1350    Visit Number  6    Number of Visits  8    Date for SLP Re-Evaluation  09/05/18   90 days   SLP Start Time  1222    SLP Stop Time   1254    SLP Time Calculation (min)  32 min    Activity Tolerance  Patient tolerated treatment well       Past Medical History:  Diagnosis Date  . Aortic stenosis    mild echocardiogram 8/09 EF 60%, showed no regional wall motion abnml, mild LVH, mod focal basal septal hypertrophy and mild dyastolic dysfunction. 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 mean gradient across aortic valve was 8 mmHG. also mild L atrial enlargement and normal RV size and function. minimal AS on LHC in 7/10.   . Arthritis    "all over" (07/19/2016)  . BPH (benign prostatic hypertrophy)   . CAD (coronary artery disease)    LCH (7/10) totally occluded proximal RCA with very robuse L to R collaterals, 50% proximal LAD stenosis, EF 65%, medical management.    . Chicken pox   . Chronic lower back pain    s/p surgical fusion  . Diverticulosis   . Esophageal stricture   . GERD (gastroesophageal reflux disease)   . Heart murmur   . Hepatitis B 1984  . Hiatal hernia   . History of radiation therapy 01/16/18- 03/05/18   Left Tonsil, 66 Gy in 33 fractions to high risk nodal echelons.   Mar[place] Kitchen HLD (hyperlipidemia)   . HTN (hypertension)   . 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 Osteoarthritis   . TIA (transient ischemic attack) 1990s   hx  . tonsillar ca dx'd 11/2017  . Tubular adenoma of colon 2009    Past Surgical History:  Procedure Laterality Date  . BACK SURGERY     . CATARACT EXTRACTION W/ INTRAOCULAR LENS  IMPLANT, BILATERAL Bilateral 01/2012 - 02/2012  . DIRECT LARYNGOSCOPY Left 10/09/2017   Procedure: DIRECT LARYNGOSCOPY WITH BOPSY;  Surgeon: Jodi Marble, MD;  Location: [facility];  Service: ENT;  Laterality: Left;  . ESOPHAGOGASTRODUODENOSCOPY (EGD) WITH ESOPHAGEAL DILATION     "couple times" (07/19/2016)  . ESOPHAGOSCOPY Left 10/09/2017   Procedure: ESOPHAGOSCOPY;  Surgeon: Jodi Marble, MD;  Location: [facility];  Service: ENT;  Laterality: Left;  . IR GASTROSTOMY TUBE MOD SED  01/08/2018  . IR THORACENTESIS ASP PLEURAL SPACE W/IMG GUIDE  07/19/2016  . LAPAROSCOPIC CHOLECYSTECTOMY  1994  . LUMBAR DISC SURGERY  05/1996   L4-5; Dr. Sherwood Gambler   . LUMBAR LAMINECTOMY/DECOMPRESSION MICRODISCECTOMY  10/2002   L3-4. Dr. Sherwood Gambler  . MULTIPLE TOOTH EXTRACTIONS  1980s  . PARTIAL GLOSSECTOMY  12/02/2017   Dr. Nicolette [facility]- [facility]  . pharyngoplasty for closure of tingue base defect  12/02/2017   Dr. Nicolette Bang- [facility]  . PICC LINE INSERTION  07/15/2016  . POSTERIOR LUMBAR FUSION  09/1996   Ray cage, L4-5 Dr. Rita Ohara  . PROSTATE BIOPSY  ~ 2017  . radical tonsillectomy Left 12/02/2017   Dr. [facility]ico[facility] Bang at [facility]  . RIGID BRONCHOSCOPY Left 10/09/2017   Procedure: RIGID  BRONCHOSCOPY;  Surgeon: Jodi Marble, MD;  Location: [facility];  Service: ENT;  Laterality: Left;  . TRACHEOSTOMY  12/02/2017   Dr. Nicolette Bang- [facility]    There were no vitals filed for this visit.  Subjective Assessment - 06/09/18 1224    Subjective  Pt answered phone at 1222.    Currently in Pain?  No/denies            ADULT SLP TREATMENT - 06/09/18 1224      General Information   Behavior/Cognition  Alert;Cooperative;Pleasant mood      Treatment Provided   Treatment provided  Dysphagia      Dysphagia Treatment   Temperature Spikes Noted  No    Oral Cavity - Dentition  Missing dentition   reports lt lateral lingual surface attached to lt gum    Treatment Methods  Patient/caregiver education    Type of PO's observed  --   pt reports dys 1-2 solids with thin liquids   Feeding  Able to feed self    Liquids provided via  Cup    Pharyngeal Phase Signs & Symptoms  Complaints of residue    Other treatment/comments  Pt with f/u with Susa Raring 06-04-18 - pt reports [place] stated pt req'd esophageal dilation, and reported that [facility] stated scar tissue on lateral lt lingual surface will be separated in June due to COVID restrictions. Pt desires to have PEG removed however is still using approx 50% tube feeds. Pt tells SLP POs cause pain to lt tongue and jaw; SLP told pt that he could use pain meds pre-POs, and pt told SLP he has limited use of opioids due to sciatica pain. "I'm between a rock and a hard place, [name], with using the pain medicine." Pt does not think he can manipulate dosage of pain meds any more than he already is, to benefit POs. SLP strongly encouraged pt to cont to complete HEP as he is able, given reported decr'd lingual ROM and reported oral pain with performing HEP.       Assessment / Recommendations / Plan   Plan  Continue with current plan of care      Dysphagia Recommendations   Diet recommendations  Dysphagia 1 (puree);Dysphagia 2 (fine chop);Thin liquid    Liquids provided via  Cup    Medication Administration  --   as tolerated   Compensations  Slow rate;Small sips/bites;Multiple dry swallows after each bite/sip;Follow solids with liquid;Effortful swallow    Postural Changes and/or Swallow Maneuvers  Chin tuck   however, doubtful pt is performing chin tuck at home     Progression Toward Goals   Progression toward goals  Progressing toward goals       SLP Education - 06/09/18 1349    Education Details  continue HEP as best he can with reported reduction of lingual ROM    Person(s) Educated  Patient    Methods  Explanation    Comprehension  Verbalized understanding       SLP Short Term Goals - 06/09/18  1358      SLP SHORT TERM GOAL #1   Title  pt will complete HEP with rare min A over two sessions    Status  Achieved      SLP SHORT TERM GOAL #2   Title  pt will follow swallow precautions with POs during ST, with rare min A over two sessions    Status  Deferred   unable to view pt with POs given nature of  telephone call     SLP [place] #3   Title  pt will tell SLP why he is completing HEP over two sessions, with min questioning cues    Baseline  04-07-18    Status  Partially Met      SLP SHORT TERM GOAL #4   Title  pt will tell SLP rationale for food journal with min questioning cues    Status  Achieved       SLP Long Term Goals - 06/09/18 1359      SLP LONG TERM GOAL #1   Title  pt will follow swallow precautions with POs during ST over two sessions with modified independence    Time  2    Period  --   visits   Status  Deferred      SLP LONG TERM GOAL #2   Title  pt will complete HEP with modified independence over two sessions    Baseline  04-07-18    Time  2    Period  --   visits   Status  Deferred   due to nature of phone calls     SLP LONG TERM GOAL #3   Title  pt will ID at least 3 overt s/s aspiration PNA with modified independence    Time  2    Period  --   visits   Status  On-going      SLP LONG TERM GOAL #4   Title  pt will tell when he can modify HEP frequency to x3/week, over 3 sessions    Time  4    Period  --   visits   Status  On-going       Plan - 06/09/18 1351    Clinical Impression Statement  Pt visit via telephone at request of medical director Dr. Eppie Gibson. Pt describes his POs as a variety of foods (mashed lasagna, potatoes, chopped spaghetti, BBQ). Pt endorses that his reduced lingual ROM result in difficulty with some exercises on HEP. SLP told pt to do best he can with all exercises. Pt reports Dr. Nicolette Bang stated in a follow up visit 06-04-18 that pt would benefit from what appeared to be described as esophageal dilation, and  also from sx described by pt as removal of scar tissue connecting pt's lt lingual surface to his lt jaw. SLP reiterated to do all as best he can given limitations of lingual ROM. See "other treatment/comments" for more details. Again, no overt s/s aspiration reported from pt. See goal section for goal update. Pt would cont to benefit from skilled ST to address oropharyngeal dysphagia by ensuring proper and accurate completion of HEP and assess safety with current POs These visits will be conducted via telephone, video call, or in-person- whichever is deemed most appropriate for pt safet, by medical director Dr. Eppie Gibson.     Speech Therapy Frequency  --   approx once every 4 weeks for 90 days   Duration  --   8 total visits   Treatment/Interventions  Aspiration precaution training;Pharyngeal strengthening exercises;Diet toleration management by SLP;Trials of upgraded texture/liquids;Internal/external aids;Patient/family education;Compensatory strategies;SLP instruction and feedback;Environmental controls;Cueing hierarchy    Potential to Achieve Goals  Good    Potential Considerations  Severity of impairments;Ability to learn/carryover information    SLP Home Exercise Plan  reviewed today    Consulted and Agree with Plan of Care  Patient       Patient will benefit from skilled therapeutic intervention  in order to improve the following deficits and impairments:   Dysphagia, oropharyngeal phase    Problem List Patient Active Problem List   Diagnosis Date Noted  . Encounter for chronic pain management 04/15/2018  . Anxiety and depression 04/15/2018  . 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 Cancer of tonsillar fossa ([place]) 09/20/2017  . Bruising 10/02/2016  . 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 Aneurysm of infrarenal abdominal aorta (HCC) 07/10/2016  . Normocytic anemia 07/10/2016  . Tobacco abuse disorder 01/10/2014  . Seasonal allergies 11/23/2013  . Essential hypertension 05/19/2013  .  Osteoarthritis 05/29/2009  . 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 Aortic valve disorder 01/28/2008  . BRAIN STEM STROKE 08/27/2007  . 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 BENIGN PROSTATIC HYPERTROPHY, HX OF 08/27/2007  . Hyperlipidemia 03/19/2007  . Anxiety state 03/19/2007  . GERD 03/19/2007    [facility] ,[place], [place]  06/09/2018, 2:00 PM  [facility] [address] [place], [place], [address] Phone: [phone]   Fax:  [phone]   Name: Brandon [name] MRN: [id number] Date of Birth: 17-Dec-1940

## 2018-06-11 ENCOUNTER — Other Ambulatory Visit: Payer: Self-pay | Admitting: Radiation Oncology

## 2018-06-11 ENCOUNTER — Other Ambulatory Visit: Payer: Self-pay | Admitting: Physician Assistant

## 2018-06-11 DIAGNOSIS — C090 Malignant neoplasm of tonsillar fossa: Secondary | ICD-10-CM

## 2018-06-12 ENCOUNTER — Telehealth: Payer: Self-pay | Admitting: *Deleted

## 2018-06-12 NOTE — Telephone Encounter (Signed)
Patient to have PT appt. on 06-17-18 @ 12 pm @ [facility], PT has notified patient

## 2018-06-13 DIAGNOSIS — Z930 Tracheostomy status: Secondary | ICD-10-CM | POA: Diagnosis not present

## 2018-06-13 DIAGNOSIS — C099 Malignant neoplasm of tonsil, unspecified: Secondary | ICD-10-CM | POA: Diagnosis not present

## 2018-06-17 ENCOUNTER — Other Ambulatory Visit: Payer: Self-pay

## 2018-06-17 ENCOUNTER — Encounter: Payer: Self-pay | Admitting: Rehabilitation

## 2018-06-17 ENCOUNTER — Ambulatory Visit: Payer: Medicare Other | Admitting: Rehabilitation

## 2018-06-17 DIAGNOSIS — I890 Lymphedema, not elsewhere classified: Secondary | ICD-10-CM | POA: Diagnosis not present

## 2018-06-17 DIAGNOSIS — L599 Disorder of the skin and subcutaneous tissue related to radiation, unspecified: Secondary | ICD-10-CM | POA: Diagnosis not present

## 2018-06-17 DIAGNOSIS — R293 Abnormal posture: Secondary | ICD-10-CM | POA: Diagnosis not present

## 2018-06-17 DIAGNOSIS — R1312 Dysphagia, oropharyngeal phase: Secondary | ICD-10-CM | POA: Diagnosis not present

## 2018-06-17 IMAGING — CR DG CHEST 2V
2 series · 2 of 2 positions shown · non-contrast
Comparison: 04/17/2013.

CLINICAL DATA: Joint pain and swelling, nausea, aortic stenosis.

EXAM:
CHEST  2 VIEW

[w chest pa]
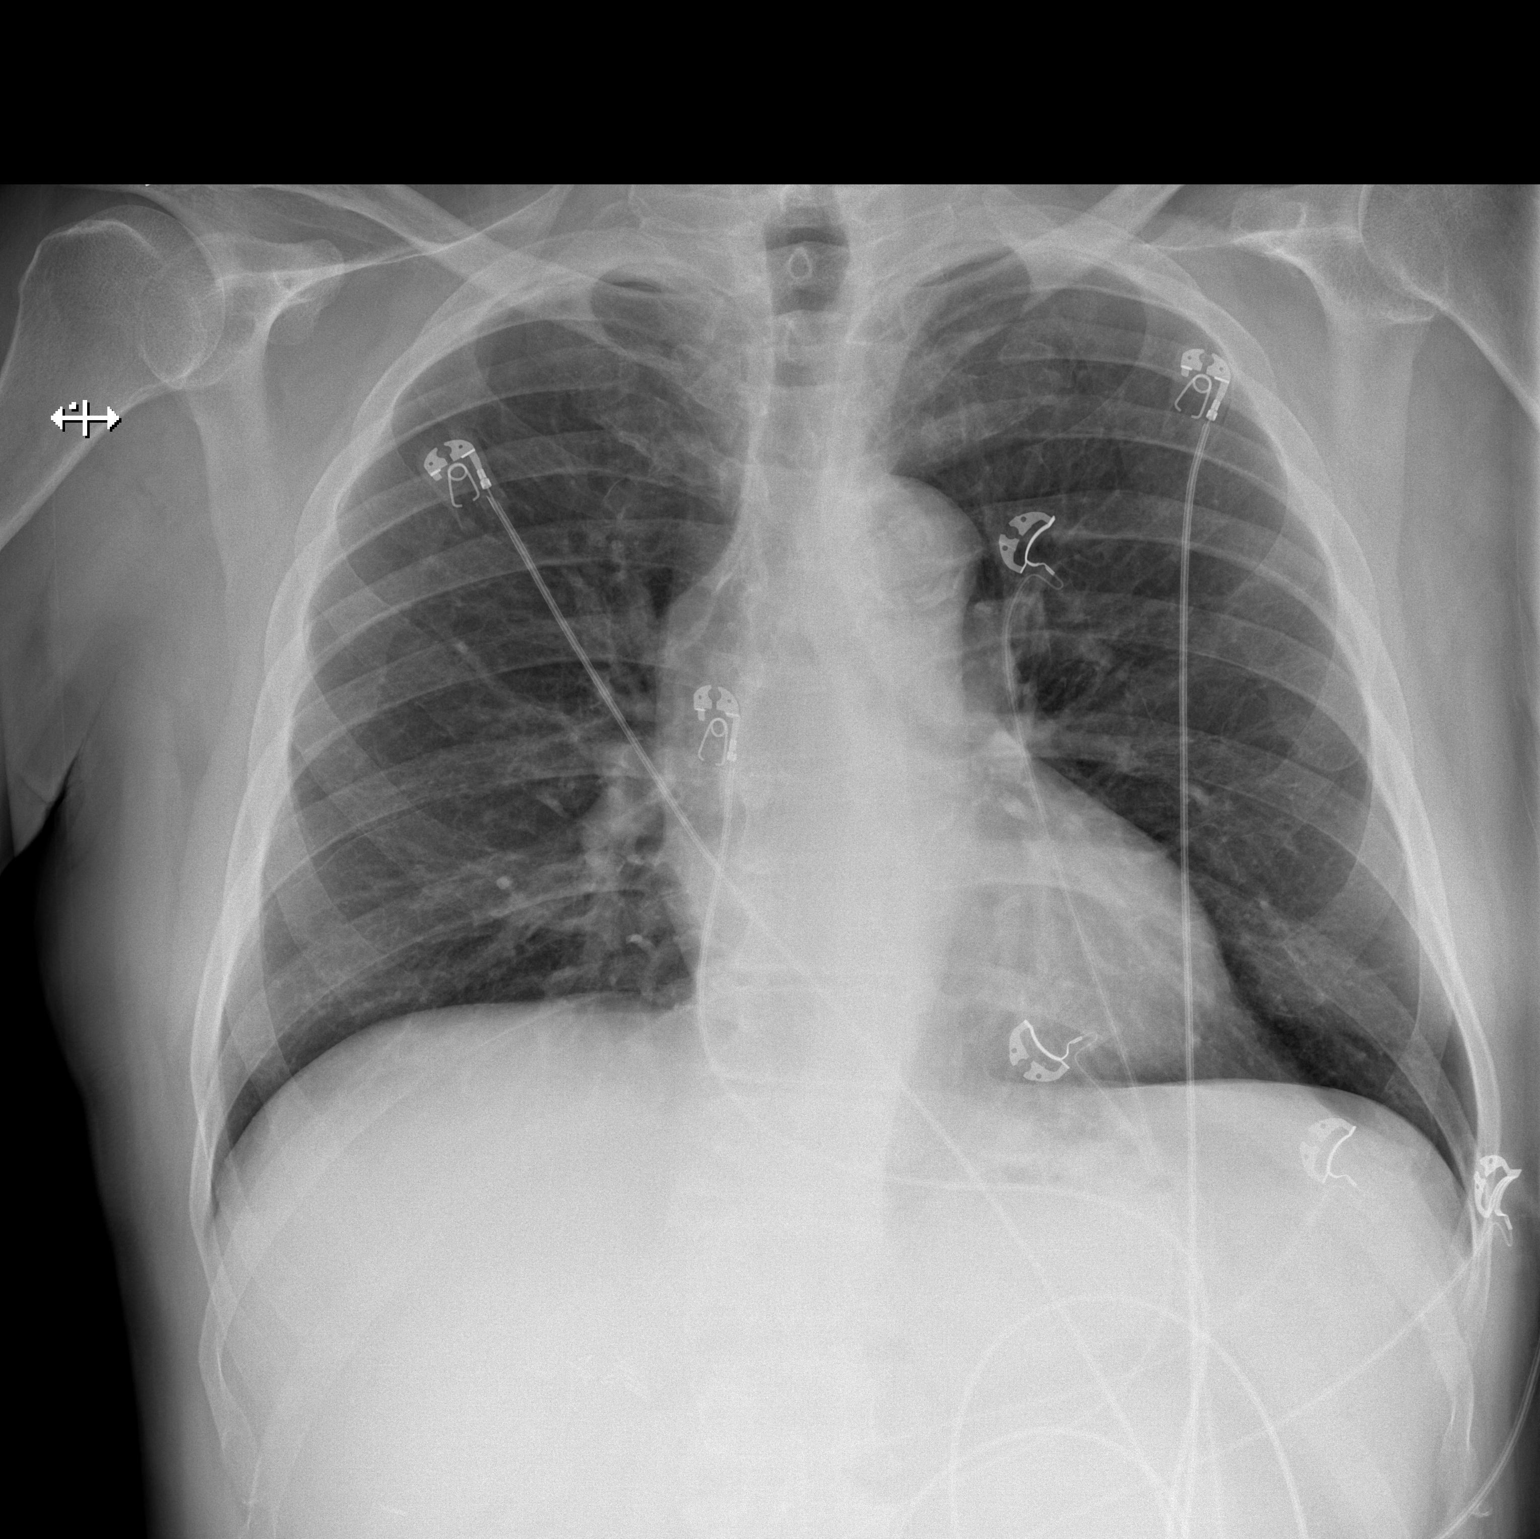

[w chest lat]
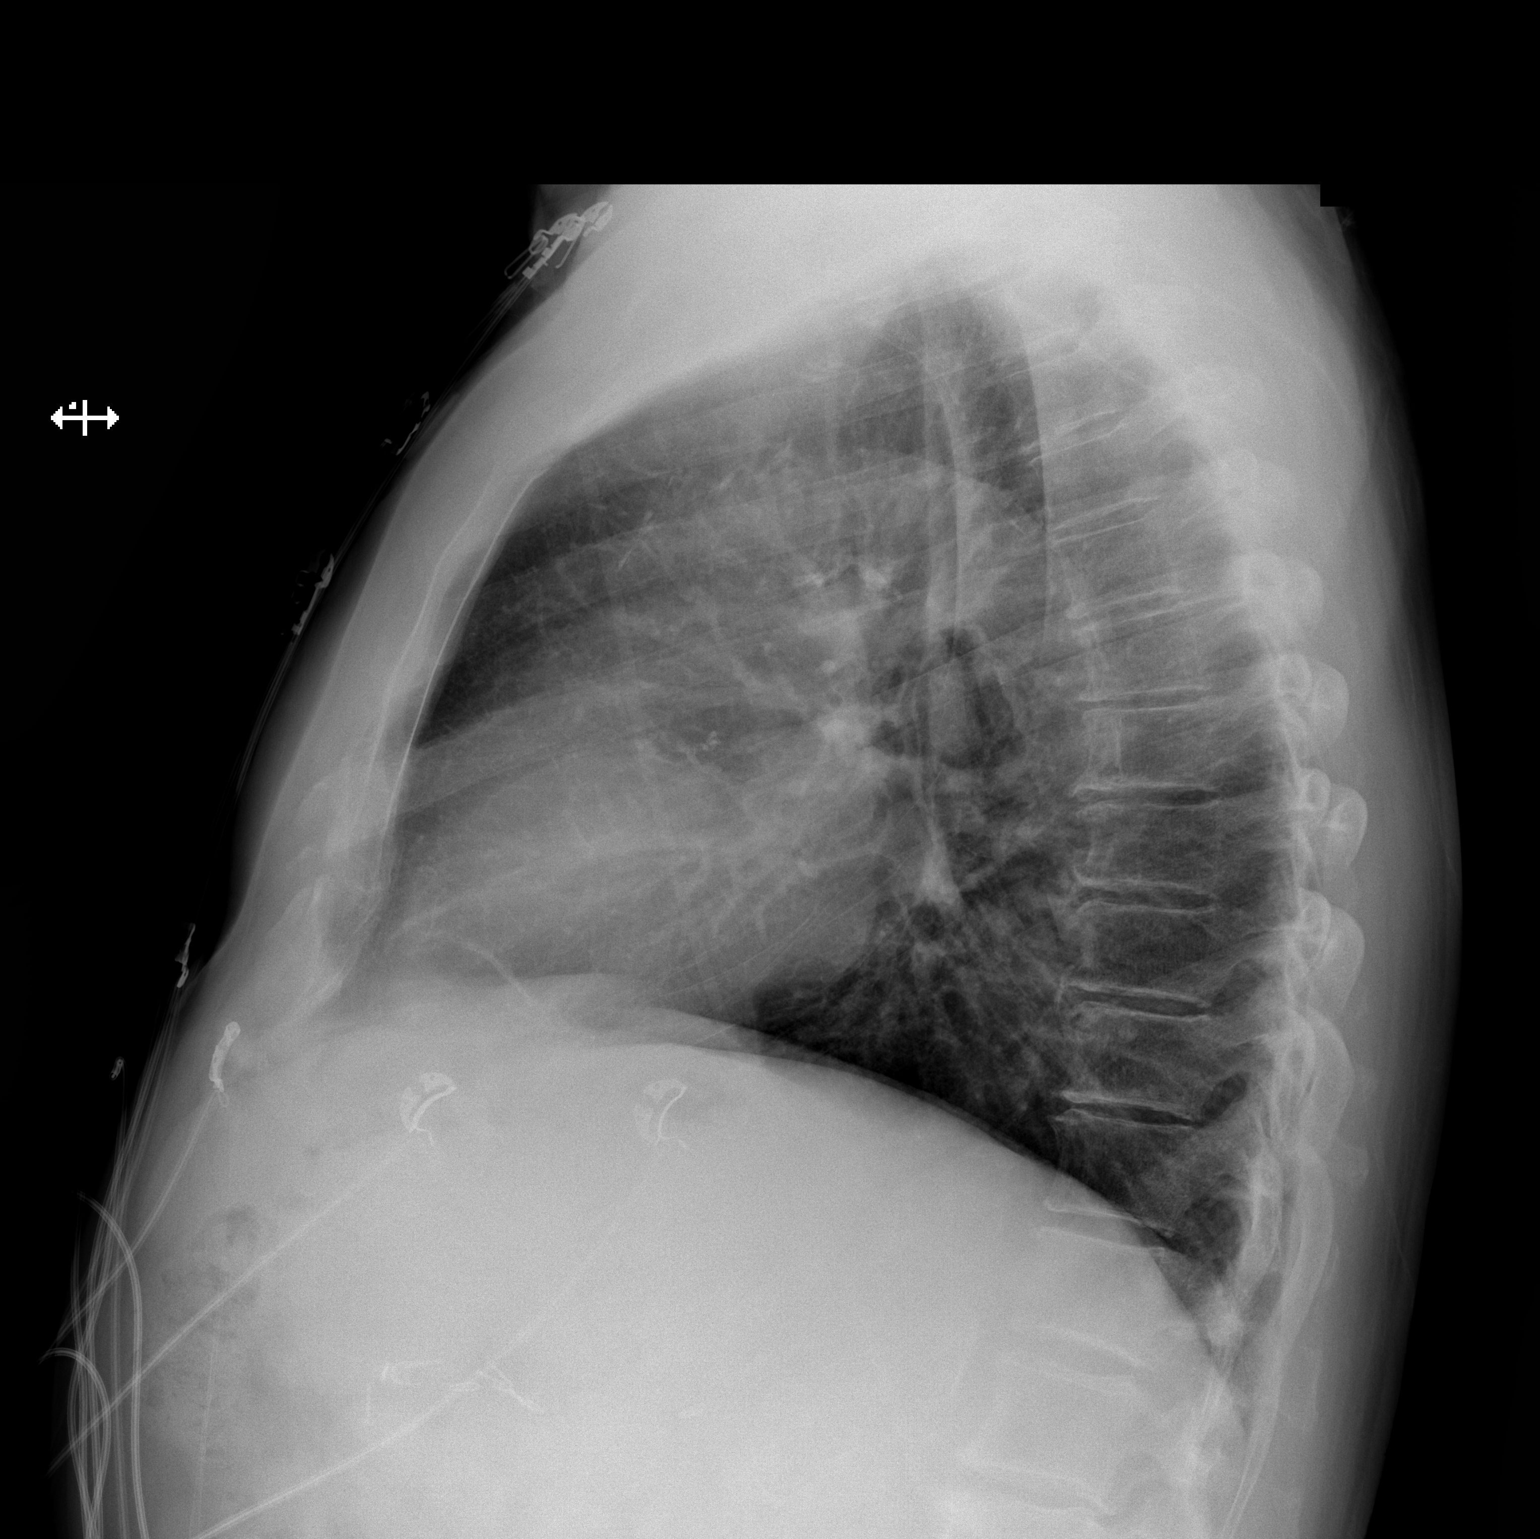

[2 of 2 positions shown; findings below may reference images not displayed]

FINDINGS: Trachea is midline. Heart size normal. Thoracic aorta is calcified.
Lungs are clear. No pleural fluid. Mild degenerative changes are
seen in the spine.
IMPRESSION: 1. No acute findings.
2.  Aortic atherosclerosis ([id number]-170.0).

## 2018-06-17 NOTE — Patient Instructions (Addendum)
Manual lymph drainage for the neck, anterior approach  Sit in front of a mirror. Do 5 slow deep breaths, breathing in through the nose and out through the mouth, letting your belly "inflate" as you breathe in.  Rest your hands on your abdomen as you do this to give slight pressure there.  1) Place hands on areas just behind collar bones and do 10 stationary circles with stretch in outward directions. 2) Do stationary circles at each armpit about 10 times. 3) Place one hand on the front of the opposite shoulder and do stationary circles with stretch downward toward underarm. 4) Repeat #1 above. 5) Imagine a river running in a line from the earlobe straight down the neck.  Place hands just behind this and do circles with stretch coming forward slightly and down, thinking about fluid flowing down that river.  Do 10 times. 6) Place one hand just in front of the river on one side and do circles with a slight back and then downward stretch, thinking again about putting that fluid in the river.  Do 10-20 times on each side. 7) Place one hand just slightly in front of the spot you just did and do the same thing.  DO THIS VERY GENTLY. 8) Use the webspace between your thumb and index finger to pump downward starting just under the chin and "stair stepping" downward with a stretch, working down to the chest. 9) Repeat #1. 10) Do stationary circles on each side of the face just above the chin, out and down with the stretch 10 times. 11) Do stationary circles on each side of the face on the cheeks with pressure going back and down, 10 times. 12) Do stationary circles on each side of the face between the eyes and ears, again back and downward 10 times. 13) Repeat steps 9,8,7,6,5,1,3 and 2 in that order!  Do not slide on the skin, but STRETCH it with your motions. Only give enough pressure to stretch the skin. DO THIS SLOWLY, PLEASE!  And do once a day.  Cancer Rehab [phone]   Order garment for next  time Use foam strap at least 69min every night

## 2018-06-17 NOTE — Addendum Note (Signed)
Addended by: Shan Levans R on: 06/17/2018 01:19 PM   Modules accepted: Orders

## 2018-06-17 NOTE — Therapy (Addendum)
[place], [place], [address] Phone: [phone]   Fax:  [phone]  Physical Therapy Evaluation  Patient Details  Name: Brandon [name] MRN: [id number] Date of Birth: 10/16/1940 Referring Provider (PT): Dr Isidore Moos   Encounter Date: 06/17/2018  PT End of Session - 06/17/18 1309    Visit Number  1    Number of Visits  6    Date for PT Re-Evaluation  07/29/18    Authorization Type  medicare    PT Start Time  1202    PT Stop Time  1250    PT Time Calculation (min)  48 min    Activity Tolerance  Patient tolerated treatment well    Behavior During Therapy  [facility] for tasks assessed/performed       Past Medical History:  Diagnosis Date  . Aortic stenosis    mild echocardiogram 8/09 EF 60%, showed no regional wall motion abnml, mild LVH, mod focal basal septal hypertrophy and mild dyastolic dysfunction. 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 mean gradient across aortic valve was 8 mmHG. also mild L atrial enlargement and normal RV size and function. minimal AS on LHC in 7/10.   . Arthritis    "all over" (07/19/2016)  . BPH (benign prostatic hypertrophy)   . CAD (coronary artery disease)    LCH (7/10) totally occluded proximal RCA with very robuse L to R collaterals, 50% proximal LAD stenosis, EF 65%, medical management.    . Chicken pox   . Chronic lower back pain    s/p surgical fusion  . Diverticulosis   . Esophageal stricture   . GERD (gastroesophageal reflux disease)   . Heart murmur   . Hepatitis B 1984  . Hiatal hernia   . History of radiation therapy 01/16/18- 03/05/18   Left Tonsil, 66 Gy in 33 fractions to high risk nodal echelons.   Marland Kitchen HLD (hyperlipidemia)   . HTN (hypertension)   . 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 Osteoarthritis   . TIA (transient ischemic attack) 1990s   hx  . tonsillar ca dx'd 11/2017  . Tubular adenoma of colon 2009    Past  Surgical History:  Procedure Laterality Date  . BACK SURGERY    . CATARACT EXTRACTION W/ INTRAOCULAR LENS  IMPLANT, BILATERAL Bilateral 01/2012 - 02/2012  . DIRECT LARYNGOSCOPY Left 10/09/2017   Procedure: DIRECT LARYNGOSCOPY WITH BOPSY;  Surgeon: Jodi Marble, MD;  Location: [place];  Service: ENT;  Laterality: Left;  . ESOPHAGOGASTRODUODENOSCOPY (EGD) WITH ESOPHAGEAL DILATION     "couple times" (07/19/2016)  . ESOPHAGOSCOPY Left 10/09/2017   Procedure: ESOPHAGOSCOPY;  Surgeon: Jodi Marble, MD;  Location: [place];  Service: ENT;  Laterality: Left;  . IR GASTROSTOMY TUBE MOD SED  01/08/2018  . IR THORACENTESIS ASP PLEURAL SPACE W/IMG GUIDE  07/19/2016  . LAPAROSCOPIC CHOLECYSTECTOMY  1994  . LUMBAR DISC SURGERY  05/1996   L4-5; Dr. Sherwood Gambler   . LUMBAR LAMINECTOMY/DECOMPRESSION MICRODISCECTOMY  10/2002   L3-4. Dr. Sherwood Gambler  . MULTIPLE TOOTH EXTRACTIONS  1980s  . PARTIAL GLOSSECTOMY  12/02/2017   Dr. Nicolette [facility]- [facility]  . pharyngoplasty for closure of tingue base defect  12/02/2017   Dr. Nicolette [facility]- [facility]  . PICC LINE INSERTION  07/15/2016  . POSTERIOR LUMBAR FUSION  09/1996   Ray cage, L4-5 Dr. Rita Ohara  . PROSTATE BIOPSY  ~ 2017  . radical tonsillectomy Left 12/02/2017   Dr. Nicolette Bang  at [facility]  . RIGID BRONCHOSCOPY Left 10/09/2017   Procedure: RIGID BRONCHOSCOPY;  Surgeon: Jodi Marble, MD;  Location: [place];  Service: ENT;  Laterality: Left;  . TRACHEOSTOMY  12/02/2017   Dr. Nicolette Bang- [facility]    There were no vitals filed for this visit.   Subjective Assessment - 06/17/18 1157    Subjective  I have fluid in my neck.  I will be going back to get my tongue released from scar tissue and will get the esophagus stretch . My tongue gives me the most trouble.  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   Surgical complications requiring return to surgery x 2 pt reports tongue was reattached to the cheek and the upper trap had to be removed.  Radiation completed 03/05/18. Gtube used for about 50% of nutrition. Also with aortic stenosis, CAD, HTN h/o TIA    Patient Stated Goals  decrease the swelling in the base of the neck     Currently in Pain?  Yes    Pain Score  5     Pain Location  Face    Pain Orientation  Left    Pain Descriptors / Indicators  Tightness;Aching    Pain Type  Surgical pain    Pain Onset  More than a month ago    Aggravating Factors   turing to the right    Pain Relieving Factors  oxycodone         [facility] PT Assessment - 06/17/18 0001      Assessment   Medical Diagnosis  L tonsil squamous cell carcinoma    Referring Provider (PT)  Dr Isidore Moos    Onset Date/Surgical Date  12/02/17    Hand Dominance  Right    Prior Therapy  pre op only      Precautions   Precaution Comments  lymphedema      Balance Screen   Has the patient fallen in the past 6 months  No    Has the patient had a decrease in activity level because of a fear of falling?   No    Is the patient reluctant to leave their home because of a fear of falling?   No      Home Film/video editor residence    Living Arrangements  Spouse/significant other    Available Help at Discharge  Family    Type of [place] to enter      Prior Function   Level of Independence  Independent    Vocation  Retired    Leisure  does not formally exercise      Cognition   Overall Cognitive Status  Within Functional Limits for tasks assessed      Observation/Other Assessments   Observations  see photos    Skin Integrity  well healed incisions, very tight skin and muscle left side      Sensation   Additional Comments  numbness cheek and neck      Coordination   Gross Motor Movements are Fluid and Coordinated  Yes      AROM   Cervical Flexion  55    Cervical  Extension  35    Cervical - Right Side Bend  30    Cervical - Left Side Bend  52  Cervical - Right Rotation  25    Cervical - Left Rotation  14            LYMPHEDEMA/ONCOLOGY QUESTIONNAIRE - 06/17/18 1219      Type   Cancer Type  left tonsil SCC      Surgeries   Other Surgery Date  12/02/17   bot resection/LND     Treatment   Active Chemotherapy Treatment  No    Past Chemotherapy Treatment  No    Active Radiation Treatment  No    Past Radiation Treatment  Yes      What other symptoms do you have   Are you Having Heaviness or Tightness  Yes    Are you having Pain  Yes      Head and Neck   Right Lateral Nostril at base of nose to medial tragus   11 cm    Left Lateral Nostril at base of nose to medial tragus   12 cm    Right Corner of mouth to where ear lobe meets face  10 cm    Left Corner of mouth to where ear lobe meets face  12 cm    Other  43.8   around neck at incision mark   Other  45.5   around neck thickest part            Objective measurements completed on examination: See above findings.      [facility] Adult PT Treatment/Exercise - 06/17/18 0001      Manual Therapy   Manual Therapy  Edema management    Manual therapy comments  made pt chip back in stockinette to use for now.  Pt to order marena group size L garment on [employer]    Edema Management  due to time pt will perform axillary circles and work from neck to axilla bilaterally.  Also given MLD handout to start looking over             PT Education - 06/17/18 1308    Education Details  foam use, garment ordering, POC, radiation effects    Person(s) Educated  Patient    Methods  Explanation;Verbal cues;Tactile cues;Handout    Comprehension  Verbalized understanding          PT Long Term Goals - 06/17/18 1315      PT LONG TERM GOAL #1   Title  Pt will be ind with self management of his lymphedema to include; compression and self MLD    Time  6    Period  Weeks    Status  New     Target Date  07/29/18      PT LONG TERM GOAL #2   Title  Pt will improve cervical rotation to the Right to at least 30degrees    Time  6    Period  Weeks    Status  New      PT LONG TERM GOAL #3   Title  pt will be ind with postural and cervical exercises for HEP    Time  6    Period  Weeks    Status  New             Plan - 06/17/18 1310    Clinical Impression Statement  Pt presents post radiation with onset of lymphedema in the submental/jowl area and possibly into the Lt cheek.  Pt also with significant scar tissue and tightness on the lt side to to emergency repair surgery when the  original incision opened.  pt reports that they even had to move the Lt UT and that the tongue was attached somehow to his cheek.  pt will be getting revision surgery for the tongue and esophagus soon.  Pt reports he is on a fixed income but would like to come until he knows what to do.      Personal Factors and Comorbidities  Finances;Comorbidity 2    Comorbidities  surgical history, radiation history    Examination-Activity Limitations  Self Feeding    Examination-Participation Restrictions  Driving    Stability/Clinical Decision Making  Stable/Uncomplicated    Clinical Decision Making  Low    Rehab Potential  Good    PT Frequency  1x / week    PT Duration  6 weeks    PT Treatment/Interventions  ADLs/Self Care Home Management;Patient/family education;Therapeutic exercise;Scar mobilization;Manual lymph drainage;Compression bandaging;Taping    PT Next Visit Plan  order garment? begin MLD and start to teach, foam needs, make sure pt is aware of postural and ROM exercises    Consulted and Agree with Plan of Care  Patient       Patient will benefit from skilled therapeutic intervention in order to improve the following deficits and impairments:  Pain, Increased fascial restricitons, Decreased range of motion, Postural dysfunction  Visit Diagnosis: Abnormal posture  Lymphedema, not elsewhere  classified     Problem List Patient Active Problem List   Diagnosis Date Noted  . Encounter for chronic pain management 04/15/2018  . Anxiety and depression 04/15/2018  . 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 Cancer of tonsillar fossa ([place]) 09/20/2017  . Bruising 10/02/2016  . 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 Aneurysm of infrarenal abdominal aorta (HCC) 07/10/2016  . Normocytic anemia 07/10/2016  . Tobacco abuse disorder 01/10/2014  . Seasonal allergies 11/23/2013  . Essential hypertension 05/19/2013  . Osteoarthritis 05/29/2009  . 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 Aortic valve disorder 01/28/2008  . BRAIN STEM STROKE 08/27/2007  . 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 BENIGN PROSTATIC HYPERTROPHY, HX OF 08/27/2007  . Hyperlipidemia 03/19/2007  . Anxiety state 03/19/2007  . GERD 03/19/2007    Shan Levans, PT 06/17/2018, 1:17 PM  [place], [place], [address] Phone: [phone]   Fax:  [phone]  Name: KATELYN KOHLMEYER MRN: [id number] Date of Birth: 09/09/1940

## 2018-06-23 DIAGNOSIS — Q381 Ankyloglossia: Secondary | ICD-10-CM | POA: Insufficient documentation

## 2018-06-24 ENCOUNTER — Encounter: Payer: Self-pay | Admitting: Rehabilitation

## 2018-06-24 ENCOUNTER — Other Ambulatory Visit: Payer: Self-pay

## 2018-06-24 ENCOUNTER — Ambulatory Visit: Payer: Medicare Other | Admitting: Rehabilitation

## 2018-06-24 DIAGNOSIS — R1312 Dysphagia, oropharyngeal phase: Secondary | ICD-10-CM | POA: Diagnosis not present

## 2018-06-24 DIAGNOSIS — R293 Abnormal posture: Secondary | ICD-10-CM | POA: Diagnosis not present

## 2018-06-24 DIAGNOSIS — L599 Disorder of the skin and subcutaneous tissue related to radiation, unspecified: Secondary | ICD-10-CM

## 2018-06-24 DIAGNOSIS — I890 Lymphedema, not elsewhere classified: Secondary | ICD-10-CM | POA: Diagnosis not present

## 2018-06-24 NOTE — Patient Instructions (Signed)
Self MLD [employer]

## 2018-06-24 NOTE — Therapy (Addendum)
[place], [place], [address] Phone: [phone]   Fax:  [phone]  Physical Therapy Treatment  Patient Details  Name: Brandon [name] MRN: [id number] Date of Birth: Aug 12, 1940 Referring Provider (PT): Dr Isidore Moos   Encounter Date: 06/24/2018  PT End of Session - 06/24/18 1244    Visit Number  2    PT Start Time  4081    PT Stop Time  1240    PT Time Calculation (min)  45 min    Activity Tolerance  Patient tolerated treatment well    Behavior During Therapy  [facility] for tasks assessed/performed       Past Medical History:  Diagnosis Date  . Aortic stenosis    mild echocardiogram 8/09 EF 60%, showed no regional wall motion abnml, mild LVH, mod focal basal septal hypertrophy and mild dyastolic dysfunction. 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 mean gradient across aortic valve was 8 mmHG. also mild L atrial enlargement and normal RV size and function. minimal AS on LHC in 7/10.   . Arthritis    "all over" (07/19/2016)  . BPH (benign prostatic hypertrophy)   . CAD (coronary artery disease)    LCH (7/10) totally occluded proximal RCA with very robuse L to R collaterals, 50% proximal LAD stenosis, EF 65%, medical management.    . Chicken pox   . Chronic lower back pain    s/p surgical fusion  . Diverticulosis   . Esophageal stricture   . GERD (gastroesophageal reflux disease)   . Heart murmur   . Hepatitis B 1984  . Hiatal hernia   . History of radiation therapy 01/16/18- 03/05/18   Left Tonsil, 66 Gy in 33 fractions to high risk nodal echelons.   Marland Kitchen HLD (hyperlipidemia)   . HTN (hypertension)   . 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 Osteoarthritis   . TIA (transient ischemic attack) 1990s   hx  . tonsillar ca dx'd 11/2017  . Tubular adenoma of colon 2009    Past Surgical History:  Procedure Laterality Date  . BACK SURGERY    . CATARACT EXTRACTION W/ INTRAOCULAR  LENS  IMPLANT, BILATERAL Bilateral 01/2012 - 02/2012  . DIRECT LARYNGOSCOPY Left 10/09/2017   Procedure: DIRECT LARYNGOSCOPY WITH BOPSY;  Surgeon: Jodi Marble, MD;  Location: [place];  Service: ENT;  Laterality: Left;  . ESOPHAGOGASTRODUODENOSCOPY (EGD) WITH ESOPHAGEAL DILATION     "couple times" (07/19/2016)  . ESOPHAGOSCOPY Left 10/09/2017   Procedure: ESOPHAGOSCOPY;  Surgeon: Jodi Marble, MD;  Location: [place];  Service: ENT;  Laterality: Left;  . IR GASTROSTOMY TUBE MOD SED  01/08/2018  . IR THORACENTESIS ASP PLEURAL SPACE W/IMG GUIDE  07/19/2016  . LAPAROSCOPIC CHOLECYSTECTOMY  1994  . LUMBAR DISC SURGERY  05/1996   L4-5; Dr. Sherwood Gambler   . LUMBAR LAMINECTOMY/DECOMPRESSION MICRODISCECTOMY  10/2002   L3-4. Dr. Sherwood Gambler  . MULTIPLE TOOTH EXTRACTIONS  1980s  . PARTIAL GLOSSECTOMY  12/02/2017   Dr. Nicolette Bang- [facility]  . pharyngoplasty for closure of tingue base defect  12/02/2017   Dr. Nicolette Bang- [facility]  . PICC LINE INSERTION  07/15/2016  . POSTERIOR LUMBAR FUSION  09/1996   Ray cage, L4-5 Dr. Rita Ohara  . PROSTATE BIOPSY  ~ 2017  . radical tonsillectomy Left 12/02/2017   [facility]ico[facility] at [facility]  . RIGID BRONCHOSCOPY Left 10/09/2017   Procedure: RIGID BRONCHOSCOPY;  Surgeon: Jodi Marble, MD;  Location: [place];  Service: ENT;  Laterality: Left;  . TRACHEOSTOMY  12/02/2017   Dr. Nicolette Bang- [facility]    There were no vitals filed for this visit.  Subjective Assessment - 06/24/18 1148    Subjective  I got my mask.  I will have my tongue worked on 07/07/18 and I have to be in quarantine until that time.  He said it will be about 2-3 weeks until that is finished.  I wore the garment and it has been very sore on my face.  The foam chip pack thing is okay though.  It seems to loosen up when I work on it though.     Pertinent History  Post transhyoid/transcervical resection of the tonsil and tongue base with left neck dissection on 12/02/17 due to squamous  cell carcinoma.  Surgical complications requiring return to surgery x 2 pt reports tongue was reattached to the cheek and the upper trap had to be removed.  Radiation completed 03/05/18. Gtube used for about 50% of nutrition. Also with aortic stenosis, CAD, HTN h/o TIA    Patient Stated Goals  decrease the swelling in the base of the neck     Currently in Pain?  Yes    Pain Score  3     Pain Location  Face    Pain Orientation  Left    Pain Descriptors / Indicators  Aching;Tightness    Pain Type  Surgical pain    Pain Onset  More than a month ago    Pain Frequency  Intermittent    Aggravating Factors   turning to the right    Pain Relieving Factors  oxycodone                       [facility] Adult PT Treatment/Exercise - 06/24/18 0001      Manual Therapy   Manual Therapy  Manual Lymphatic Drainage (MLD)    Edema Management  had pt try on his garment and realized it was a size small instead of Large which is why it was too tight.  Pt will reorder    Manual Lymphatic Drainage (MLD)  seated in front of mirror in chair educatino on self MLD technique using norton head and neck sequence.  Education on pressure, technique, anatomy and pathways.  Performed each step after PT demonstration.  Overall pt performing well but with some difficulty reaching behind the neck  on step 9a.               PT Education - 06/24/18 1244    Education Details  self MLD norton handout    Person(s) Educated  Patient    Methods  Explanation;Demonstration;Tactile cues;Verbal cues;Handout    Comprehension  Verbalized understanding;Returned demonstration;Verbal cues required;Tactile cues required          PT Long Term Goals - 06/24/18 1247      PT LONG TERM GOAL #1   Title  Pt will be ind with self management of his lymphedema to include; compression and self MLD    Status  Partially Met      PT LONG TERM GOAL #2   Title  Pt will improve cervical rotation to the Right to at least 30degrees     Status  On-going      PT LONG TERM GOAL #3   Title  pt will be ind with postural and cervical exercises for HEP    Status  On-going  Plan - 06/24/18 1244    Clinical Impression Statement  Pt returns today with facial garment but had ordered the wrong size.  Pt will reorder and continue to use at home as well as chip pack.  Education on self MLD for home use with good to excellent understanding.  Pt will need to start quarantine after this visit to get his next surgery 6/1 at [facility].  Pt agreeable to performing self MLD and using compression until able to check in here around the end of June.      PT Next Visit Plan  order new garment? continue MLD/self MLD, MT for tightness on the Rt side. make sure pt is aware of postural and ROM exercises    Consulted and Agree with Plan of Care  Patient       Patient will benefit from skilled therapeutic intervention in order to improve the following deficits and impairments:     Visit Diagnosis: Disorder of the skin and subcutaneous tissue related to radiation, unspecified  Lymphedema, not elsewhere classified     Problem List Patient Active Problem List   Diagnosis Date Noted  . Encounter for chronic pain management 04/15/2018  . Anxiety and depression 04/15/2018  . 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 Cancer of tonsillar fossa ([place]) 09/20/2017  . Bruising 10/02/2016  . 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 Aneurysm of infrarenal abdominal aorta (HCC) 07/10/2016  . Normocytic anemia 07/10/2016  . Tobacco abuse disorder 01/10/2014  . Seasonal allergies 11/23/2013  . Essential hypertension 05/19/2013  . Osteoarthritis 05/29/2009  . 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 Aortic valve disorder 01/28/2008  . BRAIN STEM STROKE 08/27/2007  . 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 BENIGN PROSTATIC HYPERTROPHY, HX OF 08/27/2007  . Hyperlipidemia 03/19/2007  . Anxiety state 03/19/2007  . GERD 03/19/2007    Shan Levans, PT 06/24/2018, 12:48 PM  [place], [place], [address] Phone: [phone]   Fax:  [phone]  Name: Brandon [name] MRN: [id number] Date of Birth: September 06, 1940  PHYSICAL THERAPY DISCHARGE SUMMARY  Visits from Start of Care:2  Current functional level related to goals / functional outcomes: Per phone call with pt he is okay with DC at this time with garment and MLD    Remaining deficits: Sig tongue pain after new procedure   Education / Equipment: Self MLD, marena garment Plan: Patient agrees to discharge.  Patient goals were partially met. Patient is being discharged due to being pleased with the current functional level.  ?????    Shan Levans, PT

## 2018-06-25 ENCOUNTER — Encounter: Payer: Self-pay | Admitting: Physician Assistant

## 2018-06-26 NOTE — Telephone Encounter (Signed)
If patient is requiring increased need of pain medicine after his surgery the surgeon will take over acute pain medication at that time. Once that is calmed down we will resume his chronic pain management. If he continues to require increased doses of medication outside of his cancer-related issues, we will need to have him see a pain specialist.

## 2018-06-27 ENCOUNTER — Telehealth: Payer: Self-pay | Admitting: Emergency Medicine

## 2018-06-27 DIAGNOSIS — C090 Malignant neoplasm of tonsillar fossa: Secondary | ICD-10-CM

## 2018-06-27 MED ORDER — OXYCODONE HCL 10 MG PO TABS: 10.0000 mg | ORAL_TABLET | Freq: Four times a day (QID) | ORAL | 0 refills | Status: DC | PRN

## 2018-06-27 NOTE — Telephone Encounter (Signed)
Advised patient the rx for Oxycodone sent in early since having to be quarantined for the week prior to surgery.

## 2018-06-27 NOTE — Telephone Encounter (Signed)
Copied from [place] [id number]. Topic: General - Other >> Jun 27, 2018 10:37 AM Carolyn Stare wrote:  Pt has questions about his pain medicine   Oxycodone HCl 10 MG TABS

## 2018-06-27 NOTE — Telephone Encounter (Signed)
Refill sent in early this once as I understand he has to quarantine until surgery.

## 2018-06-27 NOTE — Telephone Encounter (Signed)
Surgery June 1st. Has to be in quarantine until surgery.  Patient requesting an early refill of the Oxycodone before the surgery to make sure he has medication prior to surgery.  He will be due for regular refill on 07/07/18. He has about 7 pills left.  Having increase pain in back and leg pain. Taking 1 pill a day is not enough. When he takes bid along with Tylenol does help to calm the pain down. I did advise patient that he should have pain medication filled by surgeon after surgery.   Indication for chronic opioid: Chronic back pain Medication and dose: Oxycodone 10 mg # pills per month: #45 on 06/06/18 Last UDS date: 04/15/18 Opioid Treatment Agreement signed (Y/N): Yes 09/23/17 Opioid Treatment Agreement last reviewed with patient: NCCSRS reviewed this encounter (include red flags):   LOV-Pain management 04/15/18

## 2018-07-01 DIAGNOSIS — Z1159 Encounter for screening for other viral diseases: Secondary | ICD-10-CM | POA: Diagnosis not present

## 2018-07-02 DIAGNOSIS — I352 Nonrheumatic aortic (valve) stenosis with insufficiency: Secondary | ICD-10-CM | POA: Diagnosis not present

## 2018-07-02 DIAGNOSIS — I351 Nonrheumatic aortic (valve) insufficiency: Secondary | ICD-10-CM | POA: Diagnosis not present

## 2018-07-07 DIAGNOSIS — F419 Anxiety disorder, unspecified: Secondary | ICD-10-CM | POA: Diagnosis not present

## 2018-07-07 DIAGNOSIS — Z923 Personal history of irradiation: Secondary | ICD-10-CM | POA: Diagnosis not present

## 2018-07-07 DIAGNOSIS — I2510 Atherosclerotic heart disease of native coronary artery without angina pectoris: Secondary | ICD-10-CM | POA: Diagnosis not present

## 2018-07-07 DIAGNOSIS — D649 Anemia, unspecified: Secondary | ICD-10-CM | POA: Diagnosis not present

## 2018-07-07 DIAGNOSIS — I714 Abdominal aortic aneurysm, without rupture: Secondary | ICD-10-CM | POA: Diagnosis not present

## 2018-07-07 DIAGNOSIS — E119 Type 2 diabetes mellitus without complications: Secondary | ICD-10-CM | POA: Diagnosis not present

## 2018-07-07 DIAGNOSIS — R1310 Dysphagia, unspecified: Secondary | ICD-10-CM | POA: Diagnosis not present

## 2018-07-07 DIAGNOSIS — I6529 Occlusion and stenosis of unspecified carotid artery: Secondary | ICD-10-CM | POA: Diagnosis not present

## 2018-07-07 DIAGNOSIS — I10 Essential (primary) hypertension: Secondary | ICD-10-CM | POA: Diagnosis not present

## 2018-07-07 DIAGNOSIS — R52 Pain, unspecified: Secondary | ICD-10-CM | POA: Diagnosis not present

## 2018-07-07 DIAGNOSIS — Q381 Ankyloglossia: Secondary | ICD-10-CM | POA: Diagnosis not present

## 2018-07-07 DIAGNOSIS — K219 Gastro-esophageal reflux disease without esophagitis: Secondary | ICD-10-CM | POA: Diagnosis not present

## 2018-07-07 DIAGNOSIS — E785 Hyperlipidemia, unspecified: Secondary | ICD-10-CM | POA: Diagnosis not present

## 2018-07-07 DIAGNOSIS — J449 Chronic obstructive pulmonary disease, unspecified: Secondary | ICD-10-CM | POA: Diagnosis not present

## 2018-07-07 DIAGNOSIS — Z85818 Personal history of malignant neoplasm of other sites of lip, oral cavity, and pharynx: Secondary | ICD-10-CM | POA: Diagnosis not present

## 2018-07-07 DIAGNOSIS — F329 Major depressive disorder, single episode, unspecified: Secondary | ICD-10-CM | POA: Diagnosis not present

## 2018-07-12 IMAGING — US US ABSCESS DRAINAGE W/ CATHETER
1 series · 10 of 10 positions shown · non-contrast
Comparison: none

INDICATION: 75-year-old male with several month history of melena he is, fever,
night sweats and recent CT imaging highly concerning for hepatic
abscess.

[Series 1: us abscess drainage w/ catheter · 0.25mm/px · 10 of 10 slices shown]
[im 1/10]
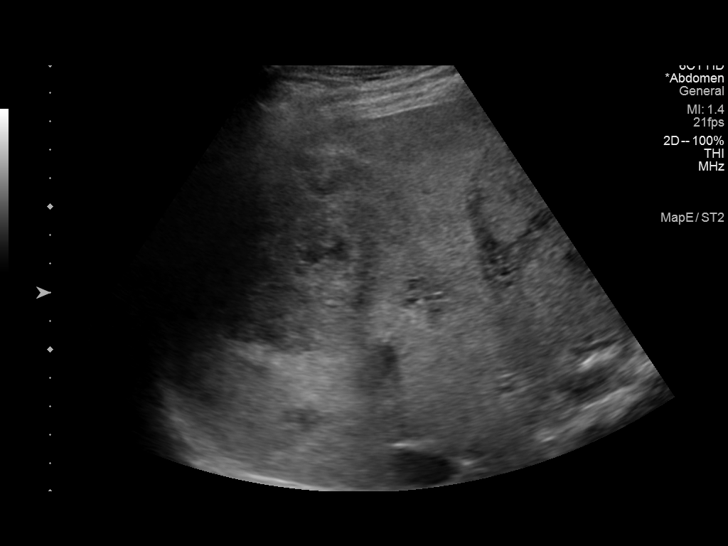
[im 2/10]
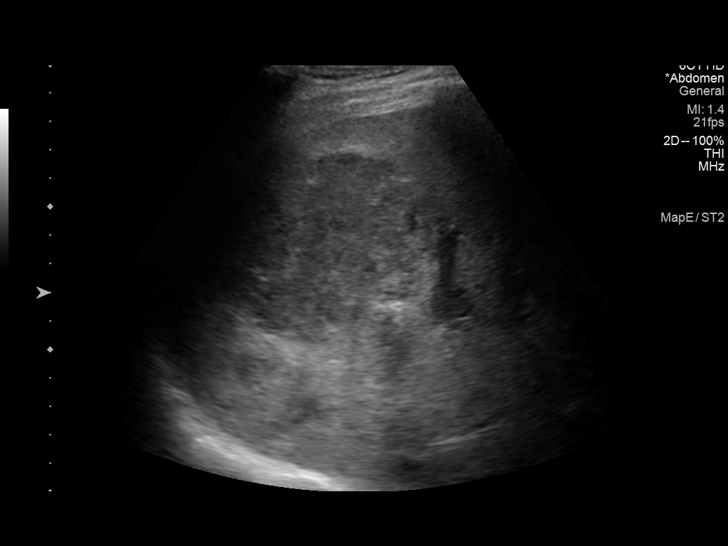
[im 3/10]
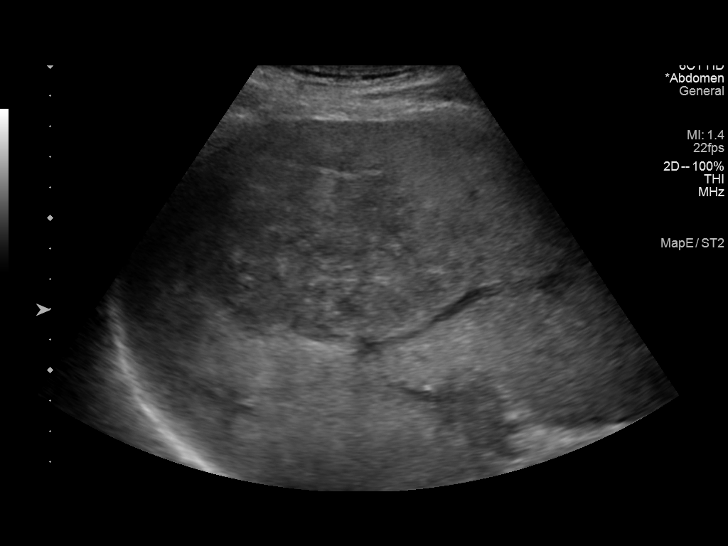
[im 4/10]
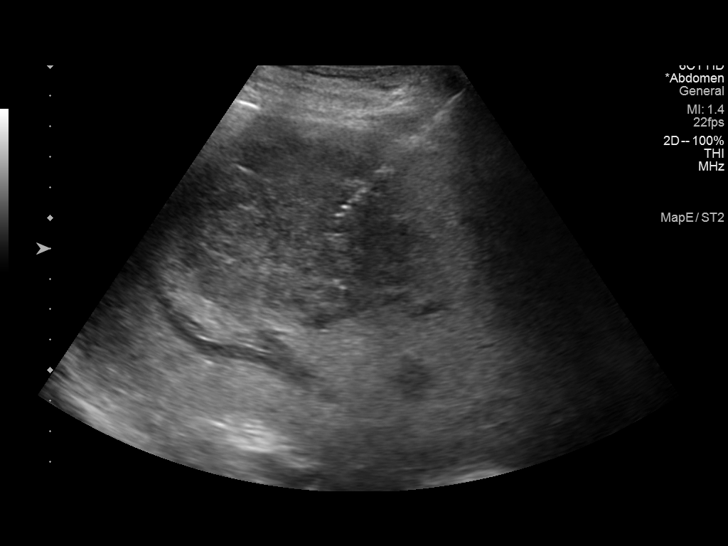
[im 5/10]
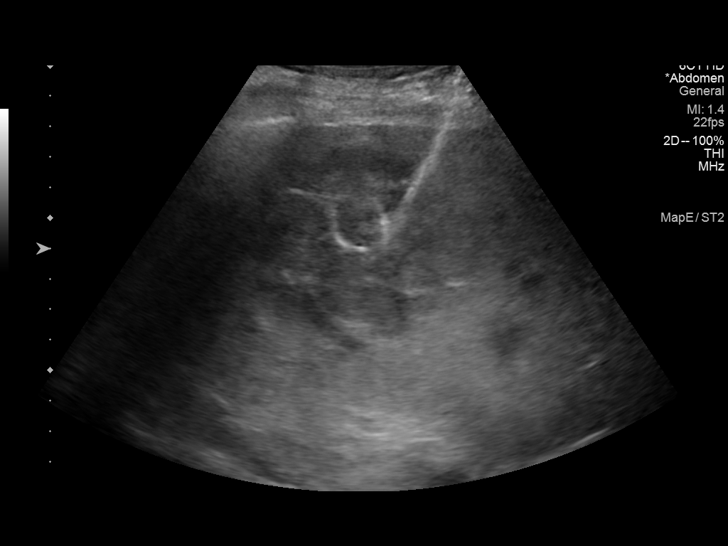
[im 6/10]
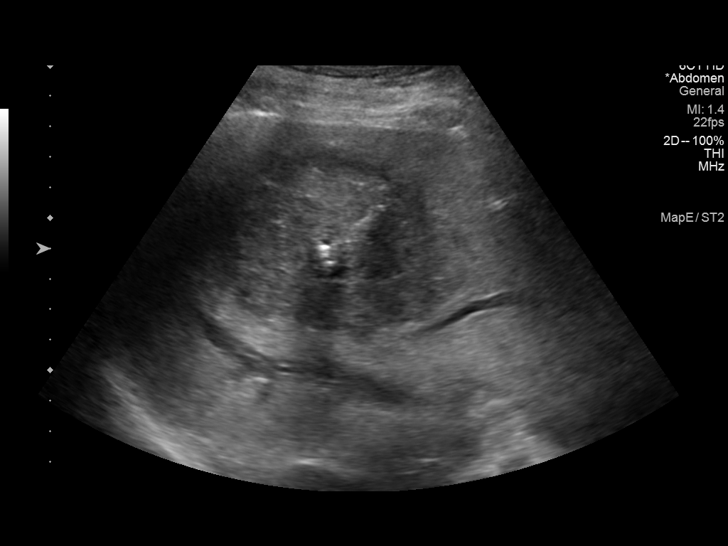
[im 7/10]
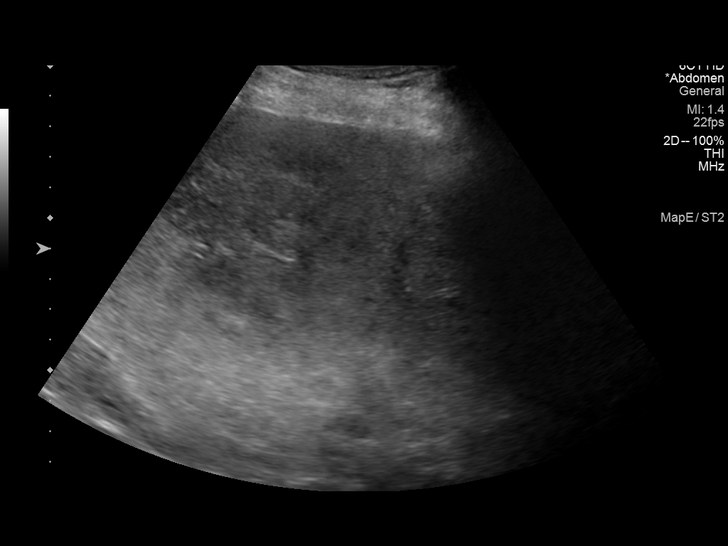
[im 8/10]
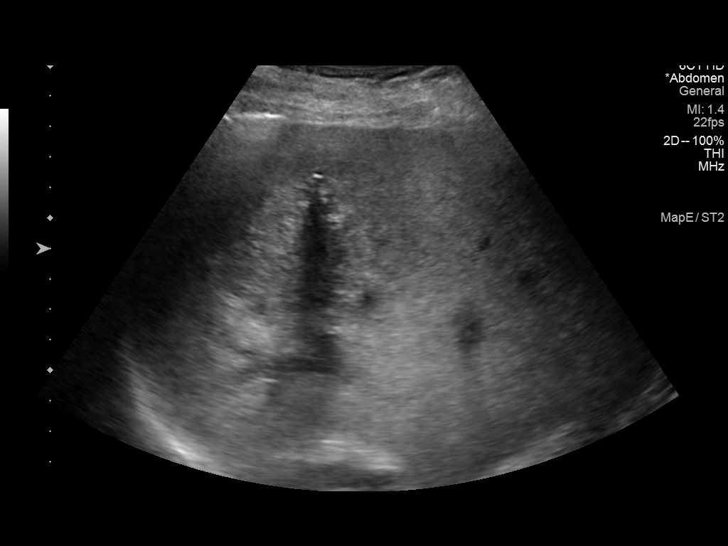
[im 9/10]
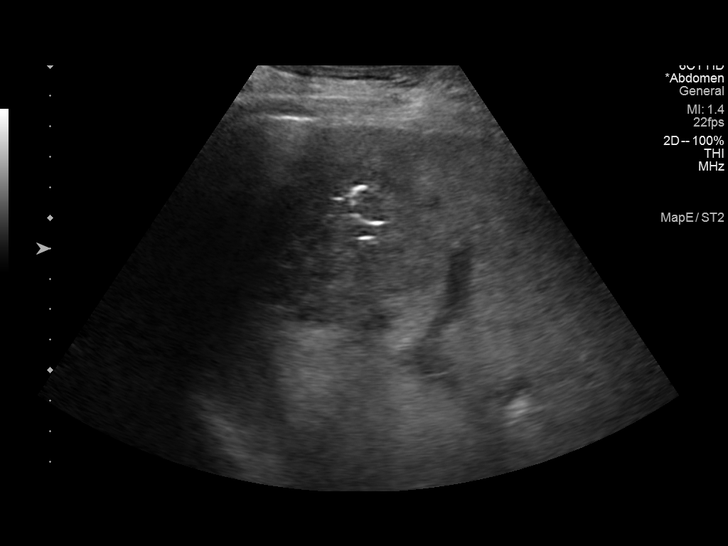
[im 10/10]
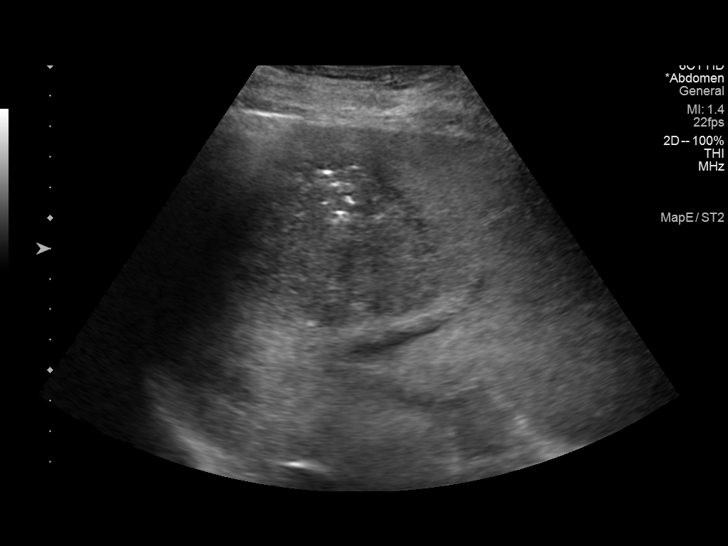

[10 of 10 positions shown; findings below may reference images not displayed]

EXAM:
Ultrasound-guided drain placement into hepatic abscess

MEDICATIONS:
The patient is currently admitted to the hospital and receiving
intravenous antibiotics. The antibiotics were administered within an
appropriate time frame prior to the initiation of the procedure.

ANESTHESIA/SEDATION:
Fentanyl 50 mcg IV; Versed 1 mg IV

Moderate Sedation Time:  20 minutes

The patient was continuously monitored during the procedure by the
interventional radiology nurse under my direct supervision.

COMPLICATIONS:
None immediate.

PROCEDURE:
Informed written consent was obtained from the patient after a
thorough discussion of the procedural risks, benefits and
alternatives. All questions were addressed. A timeout was performed
prior to the initiation of the procedure.

Right upper quadrant was interrogated with ultrasound. A complex
cystic mass is identified in the right hemi liver. The overlying
skin was sterilely prepped and draped in standard fashion using
chlorhexidine skin prep. Local anesthesia was attained by
infiltration with 1% lidocaine. A small dermatotomy was made. Under
real-time sonographic guidance, a 17 gauge introducer needle was
advanced into the fluid collection. Aspiration was performed
yielding frankly purulent material. Therefore, a 0.035 guidewire was
coiled within the complex collection. The needle was removed. The
percutaneous transhepatic tract was dilated to 10 French and Jozotrim Bagislar
10.2 French all-purpose drainage catheter was advanced over the wire
and formed within the complex collection.

Aspiration yields approximately 90 mL of thick, purulent fluid. The
drainage catheter was gently flushed and connected to JP bulb
suction and then secured to the skin with 0 Prolene suture and an
adhesive fixation device.
IMPRESSION: 1. Aspiration yields frankly purulent fluid.
2. Technically successful placement of a 10 French percutaneous
drainage catheter into the hepatic abscess. Aspiration yields
approximately 90 mL of thick, purulent fluid which was sent for
culture.

## 2018-07-14 ENCOUNTER — Other Ambulatory Visit (HOSPITAL_COMMUNITY): Payer: Self-pay

## 2018-07-14 MED ORDER — AMLODIPINE BESYLATE 10 MG PO TABS: 10.0000 mg | ORAL_TABLET | Freq: Every day | ORAL | 3 refills | Status: DC

## 2018-07-14 MED ORDER — METOPROLOL TARTRATE 25 MG PO TABS: 12.5000 mg | ORAL_TABLET | Freq: Two times a day (BID) | ORAL | 5 refills | Status: DC

## 2018-07-20 IMAGING — CT CT ABD-PELV W/ CM
2 of 3 series · 14 of 46 positions shown, 16 images · IV contrast (APPLIED)
Comparison: CT scan abdomen pelvis - 07/10/2016;

CLINICAL DATA: History of hepatic abscess, post ultrasound guided
hepatic abscess drainage catheter placement on 07/11/2016.

Patient returns to the hospital today complaining of worsening
leakage of fluid adjacent to the hepatic abscess drainage catheter.
EXAM:
CT ABDOMEN AND PELVIS WITH CONTRAST
TECHNIQUE: Multidetector CT imaging of the abdomen and pelvis was performed
using the standard protocol following bolus administration of
intravenous contrast.
CONTRAST:  100 cc Isovue 300

[Series 3: abd/ pelvis 5.0 i30f 2 · axial · 0.80mm/px · z∈[+1066,+1480]mm · 11 of 97 slices shown, 13 images]
[im 7/97  soft-tissue]
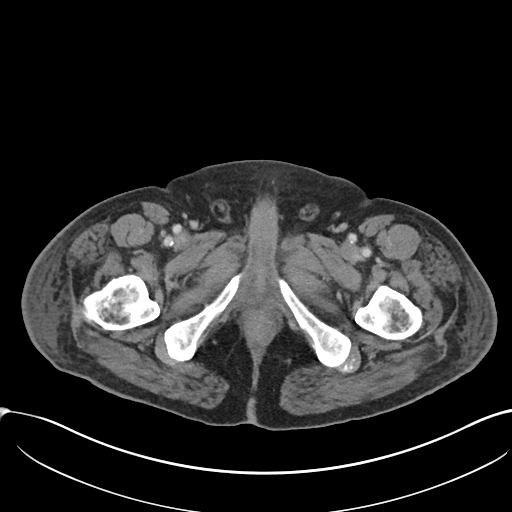
[im 7/97  bone]
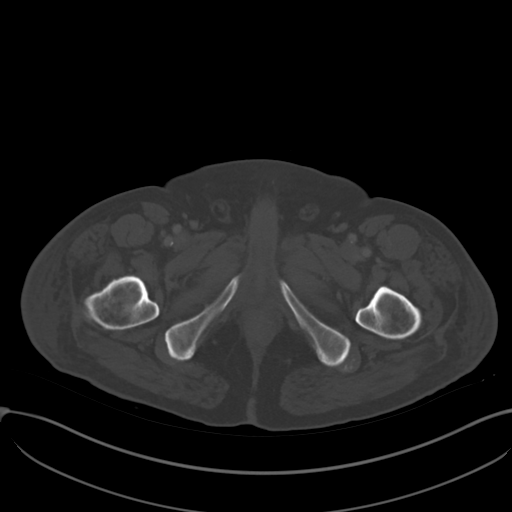
[im 16/97  soft-tissue]
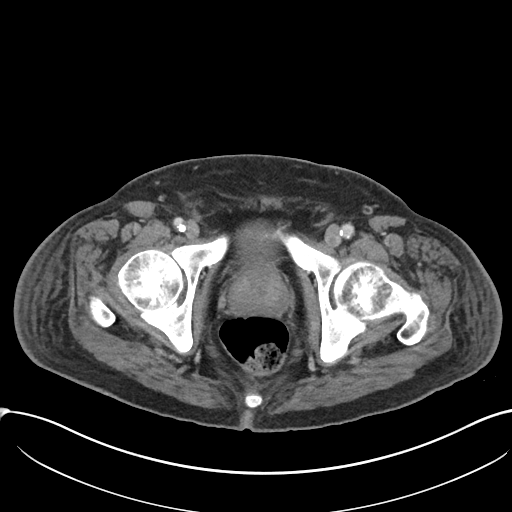
[im 22/97  soft-tissue]
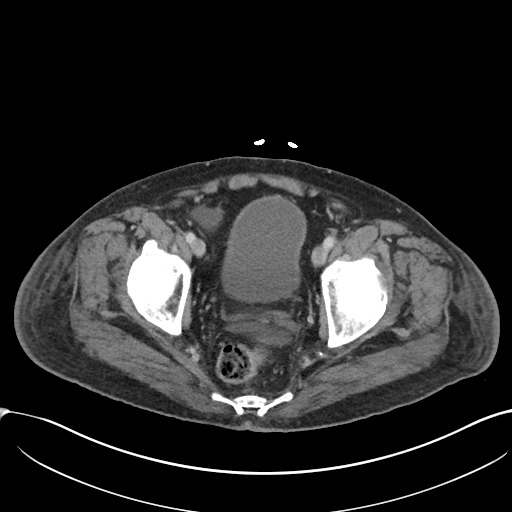
[im 31/97  soft-tissue]
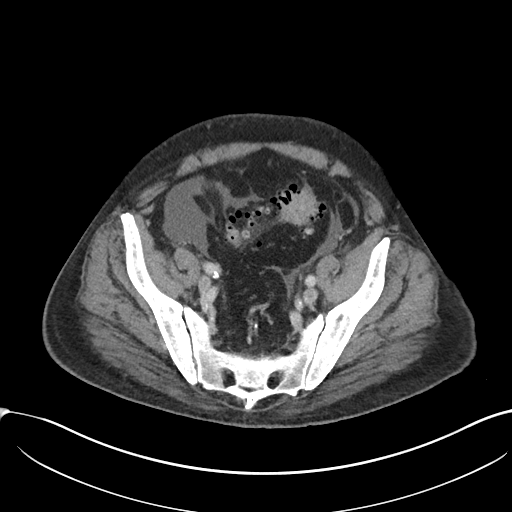
[im 41/97  soft-tissue]
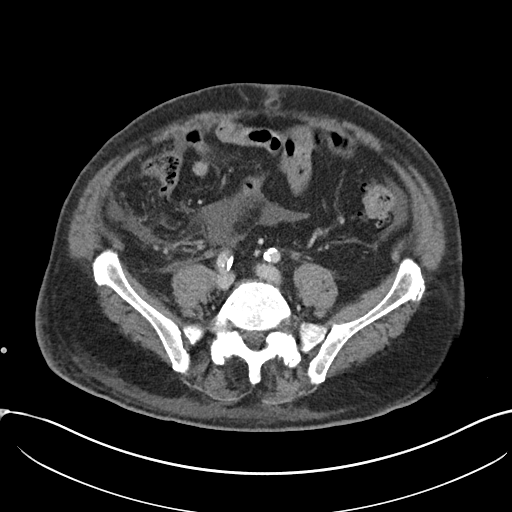
[im 50/97  soft-tissue]
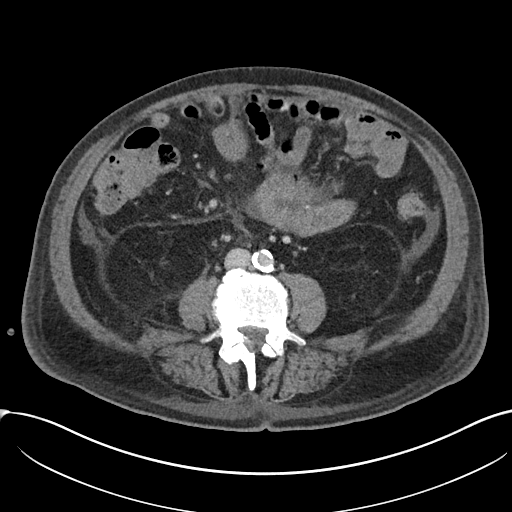
[im 56/97  soft-tissue]
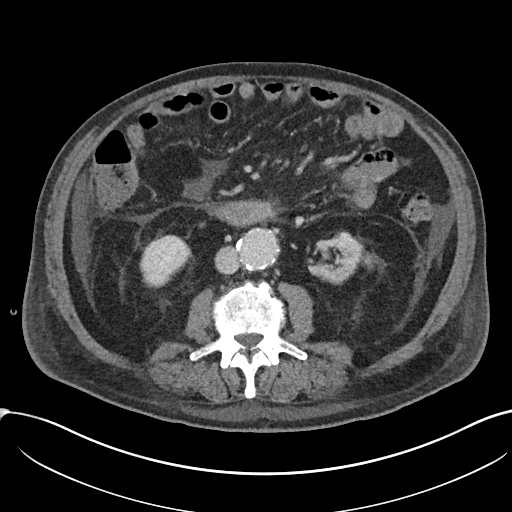
[im 66/97  soft-tissue]
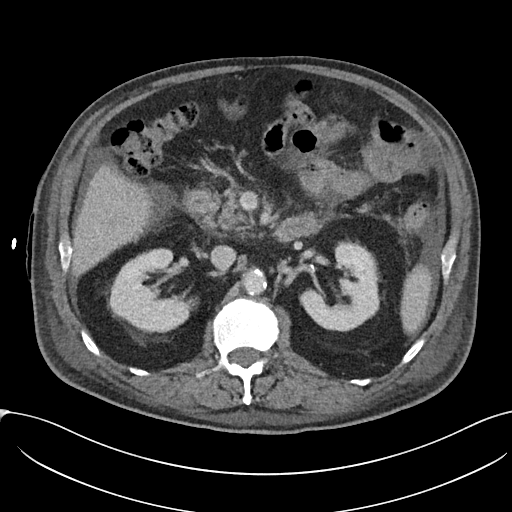
[im 75/97  soft-tissue]
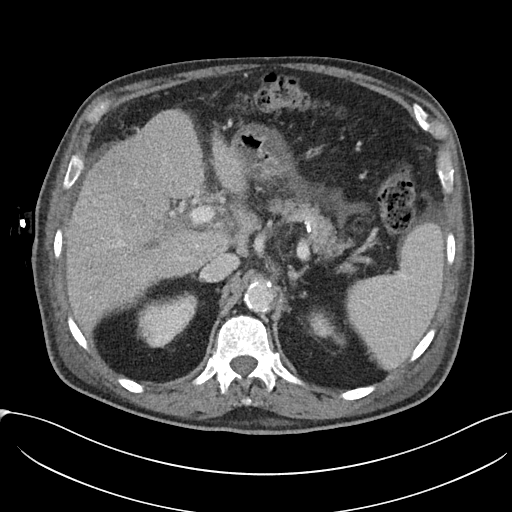
[im 75/97  bone]
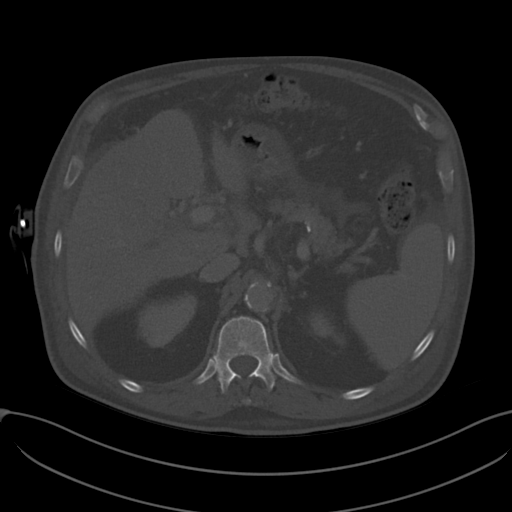
[im 81/97  soft-tissue]
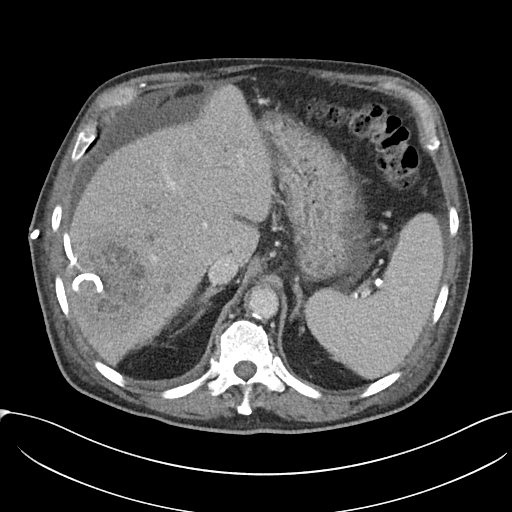
[im 90/97  soft-tissue]
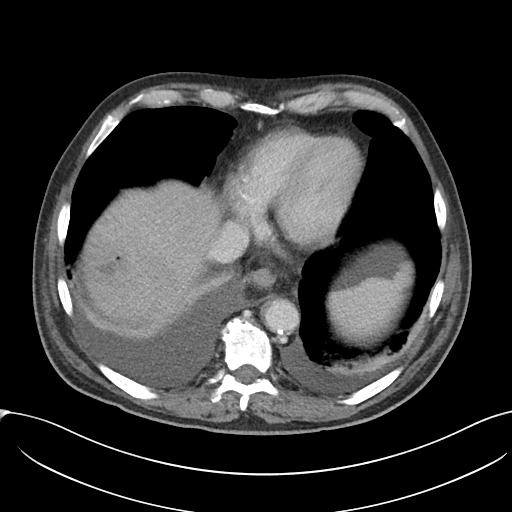

[Series 6: coronal soft tissue · coronal · 0.94mm/px · 3 of 104 slices shown]
[im 35/104  soft-tissue]
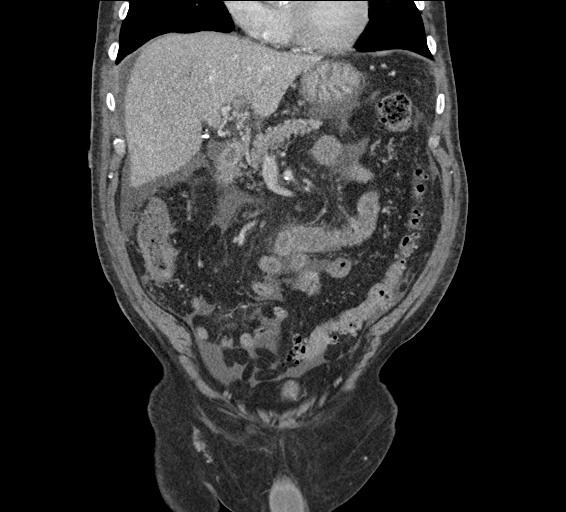
[im 46/104  soft-tissue]
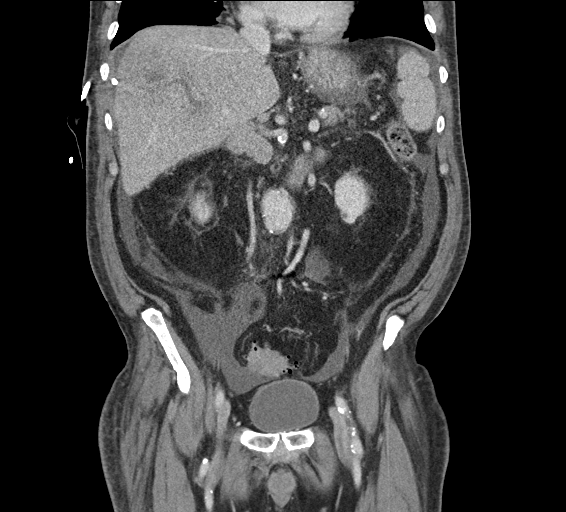
[im 58/104  soft-tissue]
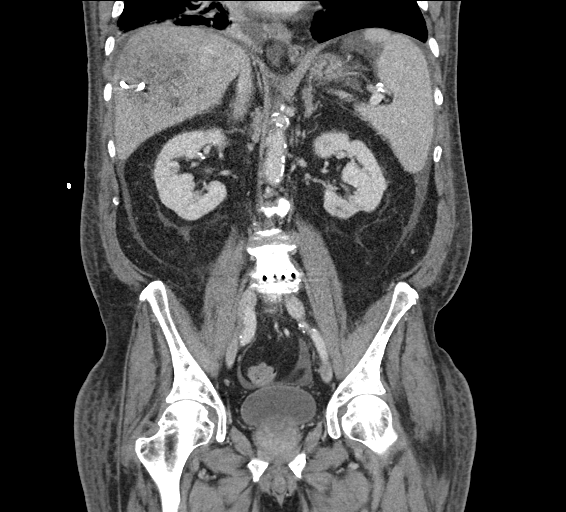

[14 of 46 positions shown; findings below may reference images not displayed]

01/03/2011;
ultrasound-guided hepatic abscess drainage catheter placement
-07/11/2016
FINDINGS: Lower chest: Limited visualization of lower thorax demonstrates
development of a small to moderate size right-sided effusion and
trace left-sided effusion with associated bibasilar subsegmental
atelectasis.

Normal heart size. Calcifications within the aortic valve leaflets.
No pericardial effusion.

Hepatobiliary: Normal hepatic contour. Suspected retraction of
percutaneous step pack abscess drainage catheter with and coiled and
locked within the peripheral aspect the right lobe of the liver.
Ill-defined hepatic abscess has minimally improved in the interval,
currently measuring approximately 7.5 x 4.9 x 6.9 cm (axial image
16, series 3, coronal image 55, series 6), previously, 7.4 x 6.1 x
8.2 cm, with decreased liquified component.

Interval development of a small amount of perihepatic fluid/ascites.

No new definable/drainable intrahepatic abscess ease. Post
cholecystectomy. No definite intrahepatic bili duct dilatation. Re-
demonstrated occlusion of the portal vein at the level of the porta
hepatis with cavernous transformation.

Pancreas: Normal appearance of the pancreas

Spleen: Normal appearance of the spleen

Adrenals/Urinary Tract: There is symmetric enhancement of the
bilateral kidneys. Re- demonstrated geographic atrophy involving the
inferior pole the left kidney, likely a sequela of prior infection
or ischemic injury. Calcifications about the bilateral renal hilar
favored to be vascular in etiology. No definite renal stones this
postcontrast examination. No urine obstruction.

Minimal thickening involving the crux of the left adrenal gland, too
small to adequately characterize. Normal appearance of the right
adrenal gland. Normal appearance of the urinary bladder given degree
distention.

Stomach/Bowel: Rather extensive colonic diverticulosis without
evidence of superimposed acute tear diverticulitis. Moderate colonic
stool burden without evidence of enteric obstruction. Normal
appearance of the terminal ileum and retrocecal appendix. No
pneumoperitoneum, pneumatosis or portal venous gas.

Vascular/Lymphatic: Moderate to large amount of eccentric mixed
calcified and noncalcified atherosclerotic plaque throughout the
abdominal aorta. Unchanged aneurysmal dilatation involving the mid
aspect of the abdominal aorta measuring approximately 3.8 x 4.1 x
3.6 cm as measured in greatest oblique short axis axial (image 39,
series 3), coronal (image 51, series 6) and sagittal (image 67,
series 7) dimensions respectively.

Reproductive: Borderline enlarged prostate gland. Smaller fluid seen
within the lower pelvis and pelvic cul-de-sac.

Other: Diffuse body wall anasarca. Small bilateral mesenteric fat
containing periumbilical hernias.

Musculoskeletal: No acute or aggressive osseous abnormalities. Post
L4-L5 intervertebral disc space replacement without definite
evidence of hardware failure loosening. Stigmata of DISH within the
caudal aspect of the thoracic spine.
IMPRESSION: 1. Suspected retraction of hepatic percutaneous drainage catheter
with end coiled and locked within the peripheral aspect of the
residual ill-defined hepatic abscess. The hepatic abscess appears to
have decreased in size the interval, currently measuring 7.5 x 4.9 x
6.9 cm, previously, 7.4 x 6.1 x 8.2 cm, with decreased liquified
component.
2. Interval development of small to moderate size right-sided
effusion and small amount of intra-abdominal ascites.
3. Otherwise, no new definable/drainable fluid collections within
the liver, abdomen or pelvis.
4. Re- demonstrated portal venous occlusion with associated
cavernous transformation.
5. Extensive colonic diverticulosis without evidence of
diverticulitis.
6. Grossly unchanged size of approximately 4.1 cm abdominal aortic
aneurysm. Recommend followup by ultrasound in 1 year. This
recommendation follows ACR consensus guidelines: White Paper of the
ACR Incidental Findings Committee II on Vascular Findings. [HOSPITAL] 4713; [DATE].
7. Aortic Atherosclerosis (ZNT5U-S9O.O). Aortic aneurysm NOS
(ZNT5U-CR7.U).

## 2018-07-29 DIAGNOSIS — C099 Malignant neoplasm of tonsil, unspecified: Secondary | ICD-10-CM | POA: Diagnosis not present

## 2018-07-29 DIAGNOSIS — Z930 Tracheostomy status: Secondary | ICD-10-CM | POA: Diagnosis not present

## 2018-07-30 DIAGNOSIS — Z483 Aftercare following surgery for neoplasm: Secondary | ICD-10-CM | POA: Diagnosis not present

## 2018-07-30 DIAGNOSIS — C108 Malignant neoplasm of overlapping sites of oropharynx: Secondary | ICD-10-CM | POA: Diagnosis not present

## 2018-07-30 DIAGNOSIS — Z923 Personal history of irradiation: Secondary | ICD-10-CM | POA: Diagnosis not present

## 2018-08-06 ENCOUNTER — Other Ambulatory Visit: Payer: Self-pay

## 2018-08-06 ENCOUNTER — Telehealth: Payer: Self-pay | Admitting: Physician Assistant

## 2018-08-06 DIAGNOSIS — C090 Malignant neoplasm of tonsillar fossa: Secondary | ICD-10-CM

## 2018-08-06 NOTE — Telephone Encounter (Signed)
Last refill: 06/27/18 #45, 0 Last OV: 04/15/18 - encounter for chronic pain management

## 2018-08-06 NOTE — Telephone Encounter (Signed)
Medication Refill - Medication: Oxycodone HCl 10 MG TABS   Has the patient contacted their pharmacy? Yes.   (Agent: If no, request that the patient contact the pharmacy for the refill.) (Agent: If yes, when and what did the pharmacy advise?)  Preferred Pharmacy (with phone number or street name):  [facility] [place], [address]  [place] [address]  Phone: [phone] Fax: [phone]     Agent: Please be advised that RX refills may take up to 3 business days. We ask that you follow-up with your pharmacy.

## 2018-08-07 ENCOUNTER — Encounter: Payer: Self-pay | Admitting: Physician Assistant

## 2018-08-07 ENCOUNTER — Other Ambulatory Visit: Payer: Self-pay

## 2018-08-07 ENCOUNTER — Ambulatory Visit (INDEPENDENT_AMBULATORY_CARE_PROVIDER_SITE_OTHER): Payer: Medicare Other | Admitting: Physician Assistant

## 2018-08-07 VITALS — BP 115/64 | Temp 97.8°F

## 2018-08-07 DIAGNOSIS — C090 Malignant neoplasm of tonsillar fossa: Secondary | ICD-10-CM | POA: Diagnosis not present

## 2018-08-07 DIAGNOSIS — C099 Malignant neoplasm of tonsil, unspecified: Secondary | ICD-10-CM | POA: Diagnosis not present

## 2018-08-07 DIAGNOSIS — G8929 Other chronic pain: Secondary | ICD-10-CM | POA: Diagnosis not present

## 2018-08-07 NOTE — Progress Notes (Signed)
Virtual Visit via Video   I connected with patient on 08/07/18 at 10:30 AM EDT by a video enabled telemedicine application and verified that I am speaking with the correct person using two identifiers.  Location patient: Home Location provider: [facility], Office Persons participating in the virtual visit: Patient, Provider, [place] (Patina Moore)  I discussed the limitations of evaluation and management by telemedicine and the availability of in person appointments. The patient expressed understanding and agreed to proceed.  Subjective:   HPI:   Reviewed and updated today.  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 Medication and dose: Oxycodone 10 mg # pills per month: 45 Last UDS date: 2019 Date narcotic database last reviewed (include red flags): today. No red flags. Got a 10-day supply of 5 mg Oxycodone from surgeon right after surgery.  Pain Inventory (1-10 worse): Average Pain 5 Pain Right Now 4 My pain is constant and dull (back); constant and burning of neck (character i.e. sharp, stabbing, dull, constant etc)  Pain is worse with: amublation, eating Relief from Meds: good relief but not lasting long enough  In the last 24 hours, has pain interfered with the following (1-10 greatest interference) ? General activity 4 Relation with others 1 Enjoyment of life 1 What TIME of day is your pain at its worst? evening       Sleep (in general) fair  Mobility/Function: Assistance device: none How many minutes can you walk? 30 min Ability to climb steps?  yes Do you drive? yes Disabled (date): N/A Neuro/Psych Sx: (bladder, bowel, weakness, dizziness, depression etc) None Prior Studies: See EMR Physicians involved in your care: Any changes since last visit?  none   ROS:   See pertinent positives and negatives per HPI.  Patient Active Problem List   Diagnosis Date Noted  . Encounter for  chronic pain management 04/15/2018  . Anxiety and depression 04/15/2018  . 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 Bruising 10/02/2016  . 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 Aneurysm of infrarenal abdominal aorta ([facility]) 07/10/2016  . Normocytic anemia 07/10/2016  . Tobacco abuse disorder 01/10/2014  . Seasonal allergies 11/23/2013  . Essential hypertension 05/19/2013  . Osteoarthritis 05/29/2009  . 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 Aortic valve disorder 01/28/2008  . BRAIN STEM STROKE 08/27/2007  . 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 BENIGN PROSTATIC HYPERTROPHY, HX OF 08/27/2007  . Hyperlipidemia 03/19/2007  . Anxiety state 03/19/2007  . GERD 03/19/2007    Social History   Tobacco Use  . Smoking status: Former Smoker    Packs/day: 1.50    Years: 50.00    Pack years: 75.00    Types: Cigarettes    Quit date: 09/05/2017    Years since quitting: 0.9  . Smokeless tobacco: Never Used  . Tobacco comment: smoked less than 1 ppd for 40+ years; Using Vapor Cig  Substance Use Topics  . Alcohol use: Not Currently    Alcohol/week: 0.0 standard drinks    Comment: 07/19/2016 "might have a few drinks/year"    Current Outpatient Medications:  .  amLODipine (NORVASC) 10 MG tablet, Take 1 tablet (10 mg total) by mouth daily., Disp: 90 tablet, Rfl: 3 .  aspirin 81 MG EC tablet, Take 81 mg by mouth at bedtime. , Disp: , Rfl:  .  atorvastatin (LIPITOR) 40 MG tablet, Take 1 tablet (40 mg total) by mouth daily., Disp: 90 tablet, Rfl: 3 .  budesonide-formoterol (SYMBICORT) 160-4.5 MCG/ACT inhaler, Inhale 2 puffs into the lungs 2 (two) times daily., Disp: 10.2 g, Rfl: 0 .  citalopram (CELEXA) 20 MG tablet, Take 1 tablet (20 mg total) by mouth daily., Disp: 30 tablet, Rfl: 3 .  clonazePAM (KLONOPIN) 0.5 MG tablet, TAKE 1 TABLET(0.5 MG) BY MOUTH TWICE DAILY AS NEEDED FOR ANXIETY, Disp: 20 tablet, Rfl: 0 .  isosorbide mononitrate (IMDUR) 30 MG 24 hr tablet, TAKE 1 TABLET(30  MG) BY MOUTH AT BEDTIME, Disp: 90 tablet, Rfl: 0 .  lisinopril (PRINIVIL,ZESTRIL) 10 MG tablet, Take 1 tablet (10 mg total) by mouth daily., Disp: 90 tablet, Rfl: 3 .  metoprolol tartrate (LOPRESSOR) 25 MG tablet, Take 0.5 tablets (12.5 mg total) by mouth 2 (two) times daily., Disp: 30 tablet, Rfl: 5 .  Nutritional Supplements (HIGH-PROTEIN NUTRITIONAL SHAKE PO), Take by mouth., Disp: , Rfl:  .  Oxycodone HCl 10 MG TABS, Take 1 tablet (10 mg total) by mouth every 6 (six) hours as needed., Disp: 45 tablet, Rfl: 0 .  pantoprazole (PROTONIX) 40 MG tablet, Take 1 tablet (40 mg total) by mouth daily., Disp: 30 tablet, Rfl: 3  Allergies  Allergen Reactions  . Celebrex [Celecoxib] Hives and Itching    Objective:   BP 115/64   Temp 97.8 F (36.6 C) (Oral)   Patient is well-developed, well-nourished in no acute distress.  Resting comfortably at home.  Head is normocephalic, atraumatic.  No labored breathing.  Speech is clear and coherent with logical contest.  Patient is alert and oriented at baseline.   Assessment and Plan:   1. Encounter for chronic pain management 2. Tonsillar cancer ([place]) Will increase frequency of dosing if needed for now due to significant increase in pain from his recent surgery. Follow-up with Oncology and ENT as scheduled. Will increase quantity of Oxycodone to 60 per month over next month. Repeat assessment in 1 month.     Leeanne Rio, PA-C 08/07/2018

## 2018-08-07 NOTE — Progress Notes (Signed)
I have discussed the procedure for the virtual visit with the patient who has given consent to proceed with assessment and treatment.   Danique Hartsough S Giang Hemme, CMA     

## 2018-08-09 MED ORDER — OXYCODONE HCL 10 MG PO TABS: 5.0000 mg | ORAL_TABLET | Freq: Four times a day (QID) | ORAL | 0 refills | Status: DC | PRN

## 2018-08-12 DIAGNOSIS — R633 Feeding difficulties: Secondary | ICD-10-CM | POA: Diagnosis not present

## 2018-08-12 DIAGNOSIS — R1312 Dysphagia, oropharyngeal phase: Secondary | ICD-10-CM | POA: Diagnosis not present

## 2018-08-18 ENCOUNTER — Telehealth: Payer: Self-pay | Admitting: *Deleted

## 2018-08-18 NOTE — Telephone Encounter (Signed)
I am happy to talk with him in office, however if pain is still this significant he truly needs a follow-up with his surgeon/oncologist and a referral to pain management if not controlled with current dose of medication. Ok to place referral if he is willing.

## 2018-08-18 NOTE — Telephone Encounter (Signed)
Patient calling in because he states that he is still in a lot of pain and he really would like to sit down face to face with cody.  He states that he did a virtual visit last time and he can do that if absolutely necessary, but he states this is difficult for him and he would really like approval to come in to discuss what other options for pain treatment. Pt is aware that I am sending message back and we will let him know if we are able to do in-office.

## 2018-08-18 NOTE — Telephone Encounter (Signed)
Spoke with patient.  He is still wanting to have an appointment, so I have scheduled him for tomorrow at 11:30.

## 2018-08-19 ENCOUNTER — Other Ambulatory Visit: Payer: Self-pay

## 2018-08-19 ENCOUNTER — Encounter: Payer: Self-pay | Admitting: Physician Assistant

## 2018-08-19 ENCOUNTER — Ambulatory Visit (INDEPENDENT_AMBULATORY_CARE_PROVIDER_SITE_OTHER): Payer: Medicare Other | Admitting: Physician Assistant

## 2018-08-19 VITALS — BP 120/64 | HR 68 | Temp 98.2°F | Resp 16 | Ht 67.0 in | Wt 168.0 lb

## 2018-08-19 DIAGNOSIS — F32A Depression, unspecified: Secondary | ICD-10-CM

## 2018-08-19 DIAGNOSIS — F329 Major depressive disorder, single episode, unspecified: Secondary | ICD-10-CM | POA: Diagnosis not present

## 2018-08-19 DIAGNOSIS — C090 Malignant neoplasm of tonsillar fossa: Secondary | ICD-10-CM

## 2018-08-19 DIAGNOSIS — F419 Anxiety disorder, unspecified: Secondary | ICD-10-CM

## 2018-08-19 MED ORDER — CITALOPRAM HYDROBROMIDE 20 MG PO TABS: 30.0000 mg | ORAL_TABLET | Freq: Every day | ORAL | 3 refills | Status: DC

## 2018-08-19 NOTE — Progress Notes (Signed)
Patient with history of SCC left tonsil s/p resection and more recent surgical correction of tongue adhesion presents to clinic today to discuss pain levels and further management regarding this. Patient was just seen on 08/07/2018 at which time we increased frequency of Oxycodone use due to increased levels of pain with recent surgery. Notes that he has been taking medication no more than as directed but it is no longer managing the level of throat pain he is having.   Tongue healing ok but taking much longer due to radiation (per ENT).  Notes throat pain has improved. Sharp pain with swallowing has resolved. Notes residual numbness in left throat and tongue.  Pain in tongue is still present, especially after swallowing a few times while eating. Notes pain is about 9/10 with eating even with current medication. Has discussed with his ENT and notes that they sent him in a medication to help with pain but when he went to pick it up was told that it was the same strength as current medication and unable to be filled. Is wanting to know what options he has and if we can contact ENT to work together to help him with this.    Past Medical History:  Diagnosis Date   Aortic stenosis    mild echocardiogram 8/09 EF 60%, showed no regional wall motion abnml, mild LVH, mod focal basal septal hypertrophy and mild dyastolic dysfunction. 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 mean gradient across aortic valve was 8 mmHG. also mild L atrial enlargement and normal RV size and function. minimal AS on LHC in 7/10.    Arthritis    "all over" (07/19/2016)   BPH (benign prostatic hypertrophy)    CAD (coronary artery disease)    LCH (7/10) totally occluded proximal RCA with very robuse L to R collaterals, 50% proximal LAD stenosis, EF 65%, medical management.     Chicken pox    Chronic lower back pain    s/p surgical fusion   Diverticulosis    Esophageal stricture      GERD (gastroesophageal reflux disease)    Heart murmur    Hepatitis B 1984   Hiatal hernia    History of radiation therapy 01/16/18- 03/05/18   Left Tonsil, 66 Gy in 33 fractions to high risk nodal echelons.    HLD (hyperlipidemia)    HTN (hypertension)    Liver abscess 07/10/2016   Osteoarthritis    TIA (transient ischemic attack) 1990s   hx   tonsillar ca dx'd 11/2017   Tubular adenoma of colon 2009    Current Outpatient Medications on File Prior to Visit  Medication Sig Dispense Refill   amLODipine (NORVASC) 10 MG tablet Take 1 tablet (10 mg total) by mouth daily. 90 tablet 3   aspirin 81 MG EC tablet Take 81 mg by mouth at bedtime.      atorvastatin (LIPITOR) 40 MG tablet Take 1 tablet (40 mg total) by mouth daily. 90 tablet 3   budesonide-formoterol (SYMBICORT) 160-4.5 MCG/ACT inhaler Inhale 2 puffs into the lungs 2 (two) times daily. 10.2 g 0   citalopram (CELEXA) 20 MG tablet Take 1 tablet (20 mg total) by mouth daily. 30 tablet 3   clonazePAM (KLONOPIN) 0.5 MG tablet TAKE 1 TABLET(0.5 MG) BY MOUTH TWICE DAILY AS NEEDED FOR ANXIETY 20 tablet 0   isosorbide mononitrate (IMDUR) 30 MG 24 hr tablet TAKE 1 TABLET(30 MG) BY MOUTH AT BEDTIME 90 tablet 0  lisinopril (PRINIVIL,ZESTRIL) 10 MG tablet Take 1 tablet (10 mg total) by mouth daily. 90 tablet 3   metoprolol tartrate (LOPRESSOR) 25 MG tablet Take 0.5 tablets (12.5 mg total) by mouth 2 (two) times daily. 30 tablet 5   Nutritional Supplements (HIGH-PROTEIN NUTRITIONAL SHAKE PO) Take by mouth.     Oxycodone HCl 10 MG TABS Take 0.5-1 tablets (5-10 mg total) by mouth every 6 (six) hours as needed. 60 tablet 0   pantoprazole (PROTONIX) 40 MG tablet Take 1 tablet (40 mg total) by mouth daily. 30 tablet 3   No current facility-administered medications on file prior to visit.     Allergies  Allergen Reactions   Celebrex [Celecoxib] Hives and Itching    Family History  Problem Relation Age of Onset    Heart disease Father 98       Living   Coronary artery disease Father        CABG   Alzheimer's disease Mother 86       Deceased   Arthritis/Rheumatoid Mother    Stomach cancer Maternal Uncle    Brain cancer Maternal Aunt        x2   Arthritis Brother        DJD   Obesity Daughter        Had Bypass Sx    Social History   Socioeconomic History   Marital status: Married    Spouse name: etta   Number of children: 2   Years of education: Not on file   Highest education level: Not on file  Occupational History   Occupation: retired    Fish farm manager: RETIRED    Comment: disabled due to back problems  Social Designer, fashion/clothing strain: Not on file   Food insecurity    Worry: Not on file    Inability: Not on file   Transportation needs    Medical: No    Non-medical: No  Tobacco Use   Smoking status: Former Smoker    Packs/day: 1.50    Years: 50.00    Pack years: 75.00    Types: Cigarettes    Quit date: 09/05/2017    Years since quitting: 0.9   Smokeless tobacco: Never Used   Tobacco comment: smoked less than 1 ppd for 40+ years; Using Vapor Cig  Substance and Sexual Activity   Alcohol use: Not Currently    Alcohol/week: 0.0 standard drinks    Comment: 07/19/2016 "might have a few drinks/year"   Drug use: No   Sexual activity: Never  Lifestyle   Physical activity    Days per week: Not on file    Minutes per session: Not on file   Stress: Not on file  Relationships   Social connections    Talks on phone: Not on file    Gets together: Not on file    Attends religious service: Not on file    Active member of club or organization: Not on file    Attends meetings of clubs or organizations: Not on file    Relationship status: Not on file  Other Topics Concern   Not on file  Social History Narrative   Married (3rd), [place]. 2 children from 1st marriage, 4 step children.    Retired on disability due to back    Former Chief Operating Officer.   restores antique furniture for a hobby.       Cell # [id number]   Review of Systems - See HPI.  All other ROS are negative.  Ht 5\' 7"  (1.702 m)    Wt 168 lb (76.2 kg)    BMI 26.31 kg/m   Physical Exam Constitutional:      Appearance: Normal appearance.  HENT:     Head: Normocephalic and atraumatic.  Cardiovascular:     Rate and Rhythm: Normal rate and regular rhythm.     Pulses: Normal pulses.     Heart sounds: Normal heart sounds.  Pulmonary:     Effort: Pulmonary effort is normal.  Neurological:     General: No focal deficit present.     Mental Status: He is alert and oriented to person, place, and time.    Recent Results (from the past 2160 hour(s))  BUN & Creatinine ([facility])     Status: None   Collection Time: 05/29/18 11:41 AM  Result Value Ref Range   BUN 14 8 - 23 mg/dL   Creatinine 0.82 0.61 - 1.24 mg/dL   GFR, Est Non Af Am >60 >60 mL/min   GFR, Est AFR Am >60 >60 mL/min    Comment: Performed at [facility], [address], [place], [place] [address]   Assessment/Plan: 1. Cancer of tonsillar fossa ([place]) Need to figure out a more therapeutic regimen for him. Discussed potential need for a pain specialist but will begin with reaching out to his ENT (Dr. Sharlynn Oliphant) to discuss prognosis regarding pain from surgery/radiation. This way we can determine likely added benefit from gabapentin, etc or if we need to consider short-term increase in dose of pain medicine and pain referral. Will contact specialist office and follow-up with patient once this discussion has taken place.   2. Anxiety and depression Deteriorated due to pain and change in quality of life with feeding tube, etc since surgery. Will increase Citalopram to 30 mg daily. Follow-up 3-4 weeks for reassessment.  - citalopram (CELEXA) 20 MG tablet; Take 1.5 tablets (30 mg total) by mouth daily.  Dispense: 45 tablet; Refill: 3   Leeanne Rio, [place]

## 2018-08-19 NOTE — Patient Instructions (Signed)
Please start the new dose of Citalopram as directed. Keep on current pain regimen for now. I am contacting Dr. Nicolette Bang so we can work together to make some changes.  We will schedule follow-up once I call you.  Hang in there!

## 2018-08-22 ENCOUNTER — Telehealth: Payer: Self-pay | Admitting: Rehabilitation

## 2018-08-22 ENCOUNTER — Telehealth: Payer: Self-pay | Admitting: *Deleted

## 2018-08-22 DIAGNOSIS — C090 Malignant neoplasm of tonsillar fossa: Secondary | ICD-10-CM

## 2018-08-22 MED ORDER — OXYCODONE HCL 10 MG PO TABS: 10.0000 mg | ORAL_TABLET | Freq: Four times a day (QID) | ORAL | 0 refills | Status: DC | PRN

## 2018-08-22 NOTE — Telephone Encounter (Signed)
Patient calling in because he has not heard anything back since his appointment this week.  He states that he only has 6 pain pills left and he was checking to see if Einar Pheasant had talked to the surgeon and come up with a plan yet.  He is asking if there is any way that anyone here could call him in even just a partial order on his pain medication to get him through the weekend until [place] gets back into the office.  He states he is still in a lot of pain, and waking up a lot during the night.  Routing to PCP and covering provider since PCP is out of office.

## 2018-08-22 NOTE — Telephone Encounter (Signed)
Patient was just given 60 tablets on the 4th of this month. Instructions are for 1/2 tablet to 1 tablet up to every 6 hours as needed since we changed dosage. I am assuming he has been taking 1 tablet every 4 hours. We are trying to work with his ENT on pain management regimen. Awaiting callback. I am sending in a 2-week refill of the patient medication while we wait to hear from specialist.

## 2018-08-22 NOTE — Telephone Encounter (Signed)
Mrs. Malmstrom called back in to follow up. Spouse says that pt will be out of medication by Monday.   Advised that I'm not seeing a response from a provider. Advised that Romelle Starcher would follow up once advised.

## 2018-08-22 NOTE — Telephone Encounter (Signed)
Talked with pt after recent procedure and he reports he is doing well at home with no needs regarding lymphedema treatment.  Will call back if it worsens.

## 2018-08-22 NOTE — Telephone Encounter (Signed)
Spoke with patient and wife. Gave instructions on how patient should be taking medication.  Stated verbal understanding.  Aware that refill for 2 weeks has been sent in.

## 2018-08-22 NOTE — Addendum Note (Signed)
Addended by: Brunetta Jeans on: 08/22/2018 03:52 PM   Modules accepted: Orders

## 2018-08-27 ENCOUNTER — Telehealth: Payer: Self-pay | Admitting: Physician Assistant

## 2018-08-27 DIAGNOSIS — C090 Malignant neoplasm of tonsillar fossa: Secondary | ICD-10-CM

## 2018-08-27 NOTE — Telephone Encounter (Signed)
Please let patient know that I would like to place him on a low dose of a medication for neuropathic pain. Has he ever taken Gabapentin that he recalls? If not would like to start at 100 mg BID x 3 days, then increase to 100 mg TID for pain. Follow-up with me via video in 2 weeks for reassessment and further adjustment of medication

## 2018-08-28 MED ORDER — GABAPENTIN 100 MG PO CAPS: ORAL_CAPSULE | ORAL | 3 refills | Status: DC

## 2018-08-28 NOTE — Telephone Encounter (Signed)
Spoke with patient and he states he is feeling better with the Oxycodone. His goal is to swallow and to eat. He denies taking Gabapentin before. He is agreeable with starting the Gabapentin for the pain. Rx for Gabapentin sent to the pharmacy. 2 week follow up appointment scheduled for video.

## 2018-08-28 NOTE — Addendum Note (Signed)
Addended by: Leonidas Romberg on: 08/28/2018 10:17 AM   Modules accepted: Orders

## 2018-09-01 ENCOUNTER — Other Ambulatory Visit (HOSPITAL_COMMUNITY): Payer: Self-pay

## 2018-09-01 MED ORDER — LISINOPRIL 10 MG PO TABS: 10.0000 mg | ORAL_TABLET | Freq: Every day | ORAL | 3 refills | Status: DC

## 2018-09-05 ENCOUNTER — Telehealth: Payer: Self-pay | Admitting: Physician Assistant

## 2018-09-05 ENCOUNTER — Encounter: Payer: Self-pay | Admitting: Physician Assistant

## 2018-09-05 MED ORDER — OXYCODONE HCL 5 MG PO TABS: 5.0000 mg | ORAL_TABLET | Freq: Four times a day (QID) | ORAL | 0 refills | Status: DC | PRN

## 2018-09-05 NOTE — Telephone Encounter (Signed)
Advised patient that rx for Oxycodone sent to the pharmacy

## 2018-09-05 NOTE — Addendum Note (Signed)
Addended by: Brunetta Jeans on: 09/05/2018 04:46 PM   Modules accepted: Orders

## 2018-09-05 NOTE — Telephone Encounter (Signed)
Patient called in and would like a call back about a prescription that was sent in for him. Call back number [phone].

## 2018-09-05 NOTE — Telephone Encounter (Signed)
Sent in Rx Oxycodone 5 mg to use up to every 8 hours -- Old quantity of 60 given now that the Gabapentin is on board and helping quite a bit. We can always go up on the Gabapentin if needed.

## 2018-09-05 NOTE — Telephone Encounter (Addendum)
Spoke with patient about medication refills. He is tolerating the Gabapentin medication tid helping with is throat Since his pain is improving. He wants to decrease the Oxycodone 10 mg to 5 mg which he was on before. He is trying to cut the Oxycodone 10 mg in half but it breaks up so bad sometimes he is not getting the 5 mg. He wanted a rx for Oxycodone 5 mg instead of the 10 mg. He is eating more and drinking protein drinks.  Indication for chronic opioid: Osteoarthritis, Cancer of Tonsillar Fossa Medication and dose: Oxycodone 10 mg q 6 hrs.  # pills per month: #45 on 08/22/18 for 14 days. Will be out on Sunday Last UDS date: 04/15/2018 Opioid Treatment Agreement signed (Y/N): Yes, 09/23/17  Opioid Treatment Agreement last reviewed with patient:   [facility] reviewed this encounter (include red flags):     Rx before was Oxycodone 5 mg q 6 hrs #60 lasting 1 month.

## 2018-09-10 DIAGNOSIS — C108 Malignant neoplasm of overlapping sites of oropharynx: Secondary | ICD-10-CM | POA: Diagnosis not present

## 2018-09-11 NOTE — Progress Notes (Signed)
error 

## 2018-09-12 ENCOUNTER — Ambulatory Visit (INDEPENDENT_AMBULATORY_CARE_PROVIDER_SITE_OTHER): Payer: Medicare Other | Admitting: Physician Assistant

## 2018-09-12 ENCOUNTER — Encounter: Payer: Self-pay | Admitting: Physician Assistant

## 2018-09-12 ENCOUNTER — Other Ambulatory Visit: Payer: Self-pay

## 2018-09-12 VITALS — BP 117/71 | Temp 98.0°F

## 2018-09-12 DIAGNOSIS — C090 Malignant neoplasm of tonsillar fossa: Secondary | ICD-10-CM

## 2018-09-12 MED ORDER — BUDESONIDE-FORMOTEROL FUMARATE 160-4.5 MCG/ACT IN AERO: 2.0000 | INHALATION_SPRAY | Freq: Two times a day (BID) | RESPIRATORY_TRACT | 1 refills | Status: DC

## 2018-09-12 NOTE — Progress Notes (Signed)
I have discussed the procedure for the virtual visit with the patient who has given consent to proceed with assessment and treatment.   Cuong Moorman S Rachael Zapanta, CMA     

## 2018-09-12 NOTE — Progress Notes (Signed)
Virtual Visit via Video   I connected with patient on 09/12/18 at 10:00 AM EDT by a video enabled telemedicine application and verified that I am speaking with the correct person using two identifiers.  Location patient: Home Location provider: [facility], Office Persons participating in the virtual visit: Patient, Provider, [place] (Patina Moore)  I discussed the limitations of evaluation and management by telemedicine and the availability of in person appointments. The patient expressed understanding and agreed to proceed.  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 Is currently on a regimen of Gabapentin 100 mg BID in addition to his 10 mg Oxycodone. Is taking medications as directed. Notes pain relief is significant with this to the point we have been able to reduce his Oxycodone back to a 5 mg tablet. Has been eating regular food ofr the past 8 days or so. Surgery just removed his feeding tube. Notes some abdominal tenderness residual from this. Otherwise doing well. Pain level in the mouth is almost a 0. Main issue is with larger meals -- a bit of tongue soreness. No residual throat pain. Is very happy with recent progress.  ROS:   See pertinent positives and negatives per HPI.  Patient Active Problem List   Diagnosis Date Noted  . Encounter for chronic pain management 04/15/2018  . Anxiety and depression 04/15/2018  . 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 Bruising 10/02/2016  . 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 Aneurysm of infrarenal abdominal aorta (HCC) 07/10/2016  . Normocytic anemia 07/10/2016  . Tobacco abuse disorder 01/10/2014  . Seasonal allergies 11/23/2013  . Essential hypertension 05/19/2013  . Osteoarthritis 05/29/2009  . 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 Aortic valve disorder 01/28/2008  . BRAIN STEM STROKE  08/27/2007  . 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 BENIGN PROSTATIC HYPERTROPHY, HX OF 08/27/2007  . Hyperlipidemia 03/19/2007  . Anxiety state 03/19/2007  . GERD 03/19/2007    Social History   Tobacco Use  . Smoking status: Former Smoker    Packs/day: 1.50    Years: 50.00    Pack years: 75.00    Types: Cigarettes    Quit date: 09/05/2017    Years since quitting: 1.0  . Smokeless tobacco: Never Used  . Tobacco comment: smoked less than 1 ppd for 40+ years; Using Vapor Cig  Substance Use Topics  . Alcohol use: Not Currently    Alcohol/week: 0.0 standard drinks    Comment: 07/19/2016 "might have a few drinks/year"    Current Outpatient Medications:  .  amLODipine (NORVASC) 10 MG tablet, Take 1 tablet (10 mg total) by mouth daily., Disp: 90 tablet, Rfl: 3 .  aspirin 81 MG EC tablet, Take 81 mg by mouth at bedtime. , Disp: , Rfl:  .  budesonide-formoterol (SYMBICORT) 160-4.5 MCG/ACT inhaler, Inhale 2 puffs into the lungs 2 (two) times daily., Disp: 10.2 g, Rfl: 0 .  citalopram (CELEXA) 20 MG tablet, Take 1.5 tablets (30 mg total) by mouth daily., Disp: 45 tablet, Rfl: 3 .  clonazePAM (KLONOPIN) 0.5 MG tablet, TAKE 1 TABLET(0.5 MG) BY MOUTH TWICE DAILY AS NEEDED FOR ANXIETY, Disp: 20 tablet, Rfl: 0 .  gabapentin (NEURONTIN) 100 MG capsule, Take 1 capsule by mouth twice a day for 3 days, then three times a day, Disp: 90 capsule, Rfl: 3 .  isosorbide mononitrate (IMDUR) 30 MG 24 hr tablet, TAKE 1  TABLET(30 MG) BY MOUTH AT BEDTIME, Disp: 90 tablet, Rfl: 0 .  lisinopril (ZESTRIL) 10 MG tablet, Take 1 tablet (10 mg total) by mouth daily., Disp: 90 tablet, Rfl: 3 .  metoprolol tartrate (LOPRESSOR) 25 MG tablet, Take 0.5 tablets (12.5 mg total) by mouth 2 (two) times daily., Disp: 30 tablet, Rfl: 5 .  oxyCODONE (ROXICODONE) 5 MG immediate release tablet, Take 1 tablet (5 mg total) by mouth every 6 (six) hours as needed for severe pain., Disp: 60 tablet, Rfl: 0 .  pantoprazole (PROTONIX) 40 MG  tablet, Take 1 tablet (40 mg total) by mouth daily., Disp: 30 tablet, Rfl: 3 .  atorvastatin (LIPITOR) 40 MG tablet, Take 1 tablet (40 mg total) by mouth daily., Disp: 90 tablet, Rfl: 3  Allergies  Allergen Reactions  . Celebrex [Celecoxib] Hives and Itching    Objective:   BP 117/71   Temp 98 F (36.7 C) (Oral)   Patient is well-developed, well-nourished in no acute distress.  Resting comfortably at home.  Head is normocephalic, atraumatic.  No labored breathing.  Speech is clear and coherent with logical content.  Patient is alert and oriented at baseline.   Assessment and Plan:   1. Cancer of tonsillar fossa ([place]) Several weeks out from detachment of tongue (previously anchored to left posterior pharynx. Now without feeding tube and eating well. Oral and throat pain is finally abating. Gabapentin helping significantly. Dose of pain medicine has been reduced back to his regular dose for chronic back pain. Will monitor. Follow-up scheduled.     Leeanne Rio, PA-C 09/12/2018

## 2018-09-17 ENCOUNTER — Ambulatory Visit
Admission: RE | Admit: 2018-09-17 | Discharge: 2018-09-17 | Disposition: A | Discharge status: Home / Self Care | Payer: Medicare Other | Source: Ambulatory Visit | Attending: Radiation Oncology | Admitting: Radiation Oncology

## 2018-09-17 ENCOUNTER — Ambulatory Visit: Payer: Medicare Other

## 2018-09-22 ENCOUNTER — Other Ambulatory Visit: Payer: Self-pay | Admitting: Gastroenterology

## 2018-09-22 DIAGNOSIS — Z86010 Personal history of colonic polyps: Secondary | ICD-10-CM

## 2018-09-22 DIAGNOSIS — R1314 Dysphagia, pharyngoesophageal phase: Secondary | ICD-10-CM

## 2018-09-22 DIAGNOSIS — K219 Gastro-esophageal reflux disease without esophagitis: Secondary | ICD-10-CM

## 2018-09-28 ENCOUNTER — Encounter: Payer: Self-pay | Admitting: Physician Assistant

## 2018-09-29 ENCOUNTER — Encounter: Payer: Self-pay | Admitting: Physician Assistant

## 2018-09-29 MED ORDER — OXYCODONE HCL 5 MG PO TABS: 5.0000 mg | ORAL_TABLET | Freq: Four times a day (QID) | ORAL | 0 refills | Status: DC | PRN

## 2018-09-30 ENCOUNTER — Other Ambulatory Visit: Payer: Self-pay | Admitting: Physician Assistant

## 2018-10-01 ENCOUNTER — Telehealth (HOSPITAL_COMMUNITY): Payer: Self-pay | Admitting: *Deleted

## 2018-10-01 NOTE — Telephone Encounter (Signed)
Pt left a vm requesting a return call stating he had information he needed to give Dr.McLean I called pt back no answer left vm for return call.

## 2018-10-02 ENCOUNTER — Telehealth (HOSPITAL_COMMUNITY): Payer: Self-pay | Admitting: *Deleted

## 2018-10-02 NOTE — Telephone Encounter (Signed)
Records faxed to [facility] per patient signed record release form.     Fax [phone]

## 2018-10-15 ENCOUNTER — Encounter: Payer: Self-pay | Admitting: Physician Assistant

## 2018-10-15 ENCOUNTER — Ambulatory Visit (INDEPENDENT_AMBULATORY_CARE_PROVIDER_SITE_OTHER): Payer: Medicare Other

## 2018-10-15 ENCOUNTER — Other Ambulatory Visit: Payer: Self-pay

## 2018-10-15 DIAGNOSIS — Z23 Encounter for immunization: Secondary | ICD-10-CM

## 2018-10-15 DIAGNOSIS — I6529 Occlusion and stenosis of unspecified carotid artery: Secondary | ICD-10-CM

## 2018-10-15 DIAGNOSIS — I714 Abdominal aortic aneurysm, without rupture: Secondary | ICD-10-CM

## 2018-10-15 DIAGNOSIS — I7143 Infrarenal abdominal aortic aneurysm, without rupture: Secondary | ICD-10-CM

## 2018-10-27 IMAGING — US US ABDOMEN LIMITED
1 series · 14 of 25 positions shown · non-contrast
Comparison: Abdominal CT earlier this day.

CLINICAL DATA: Abnormal CT.  Portal vein obstruction.

EXAM:
ULTRASOUND ABDOMEN LIMITED RIGHT UPPER QUADRANT

[Series 1: us abdomen limited · 0.28mm/px · 14 of 51 slices shown]
[im 1/51]
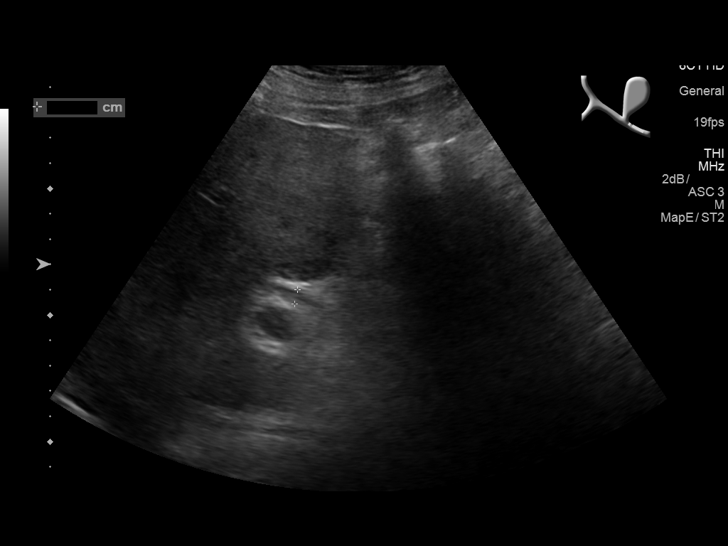
[im 5/51]
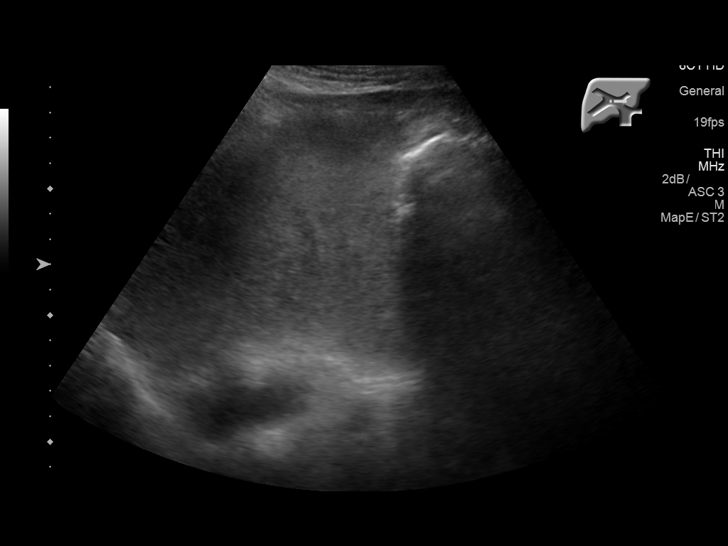
[im 9/51]
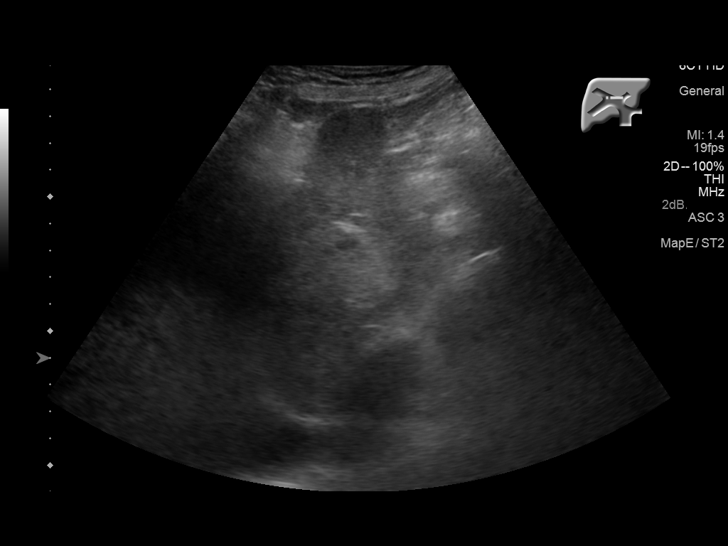
[im 13/51]
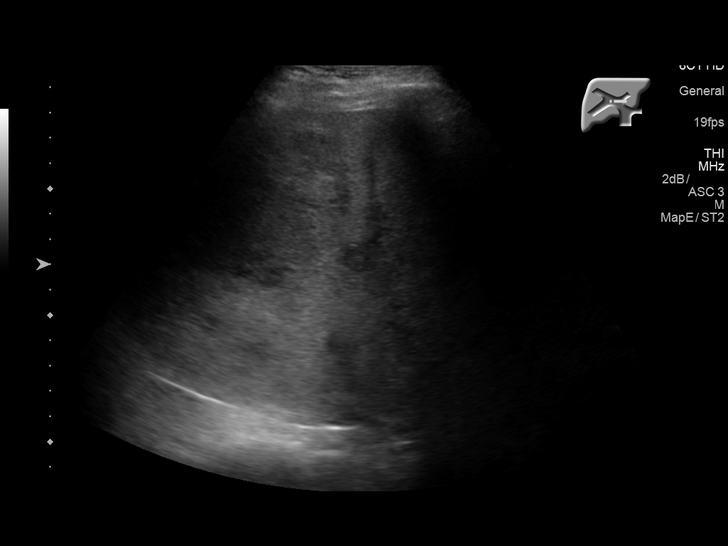
[im 17/51]
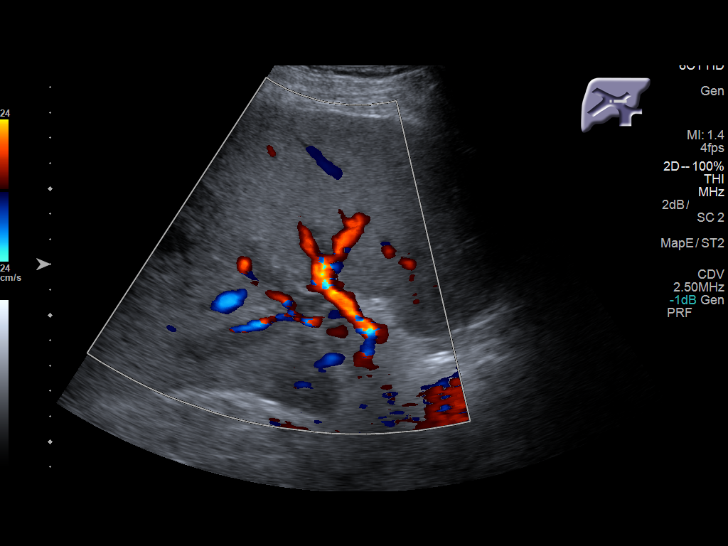
[im 19/51]
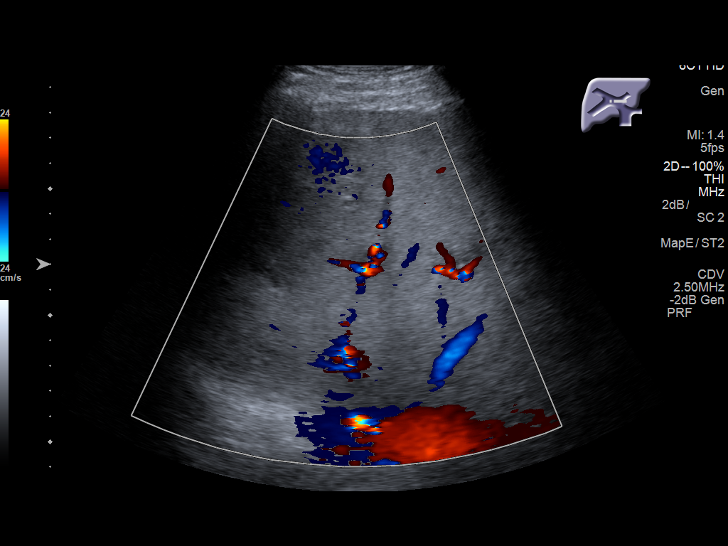
[im 23/51]
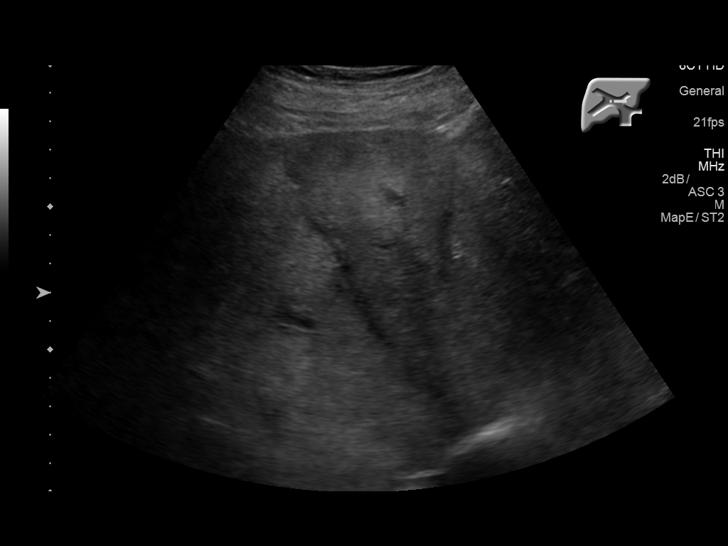
[im 28/51]
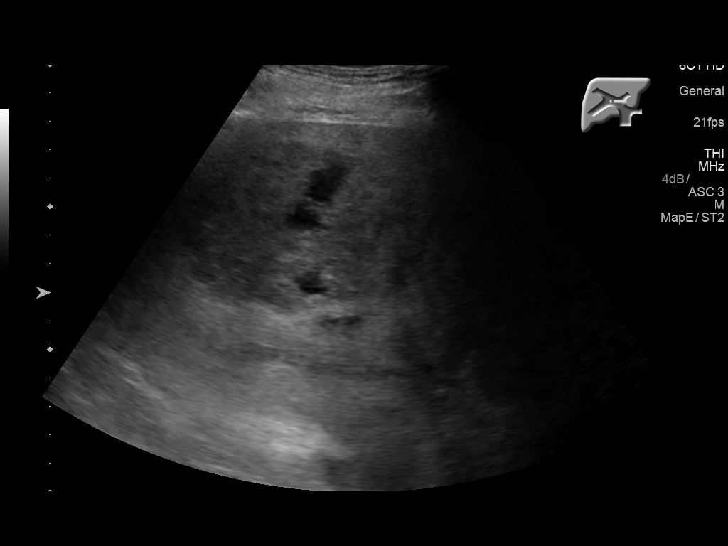
[im 32/51]
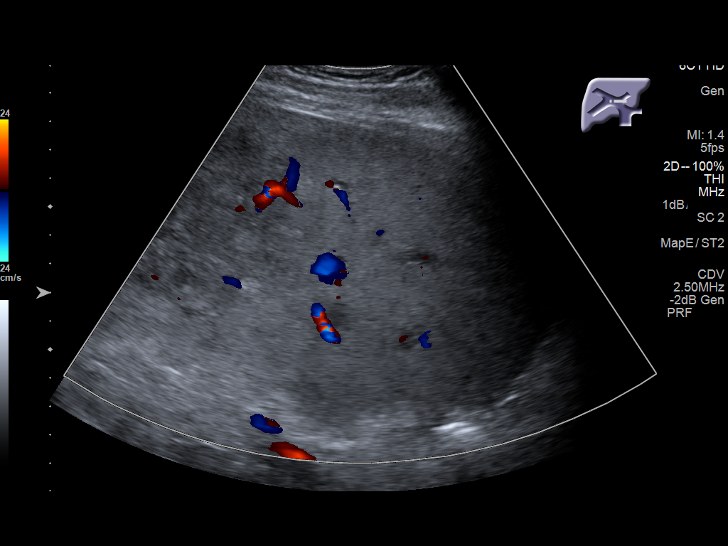
[im 34/51]
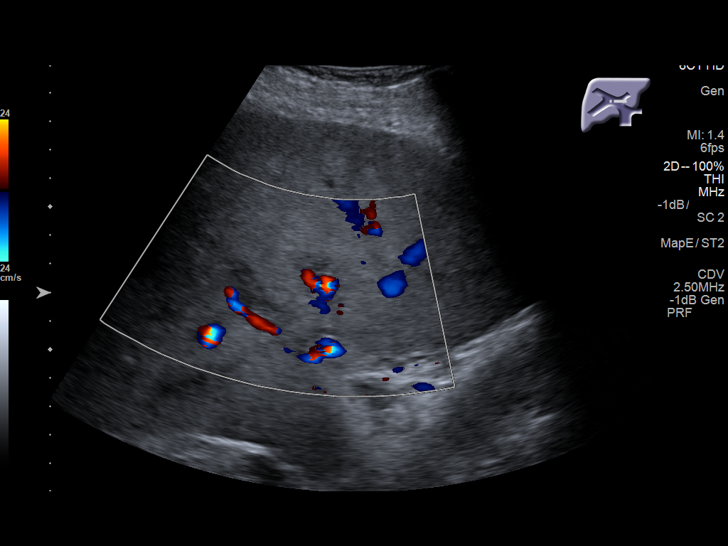
[im 38/51]
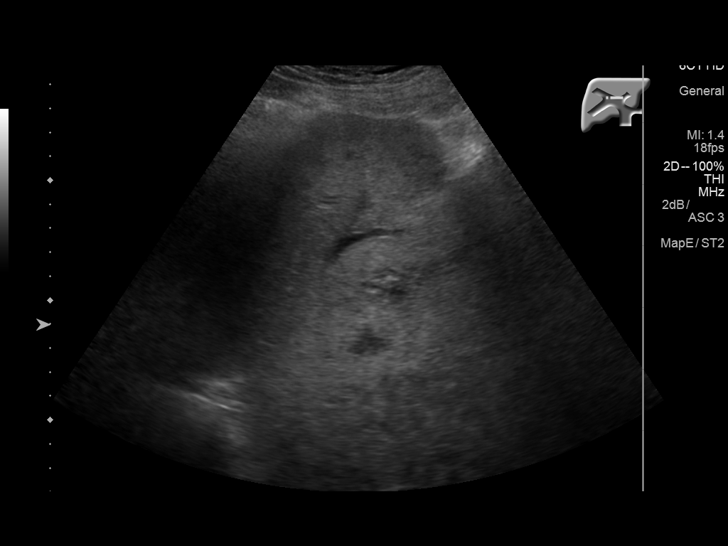
[im 42/51]
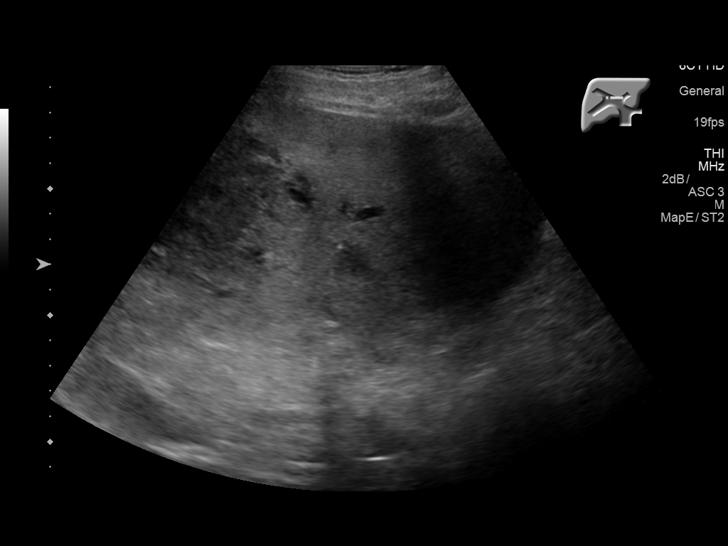
[im 46/51]
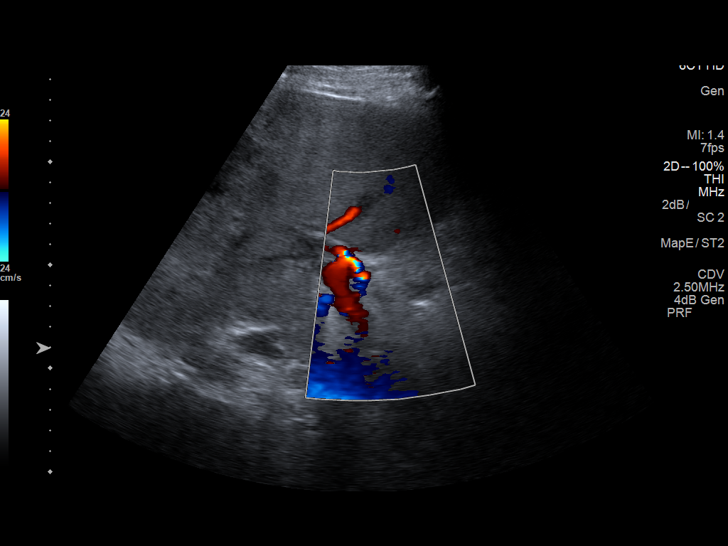
[im 51/51]
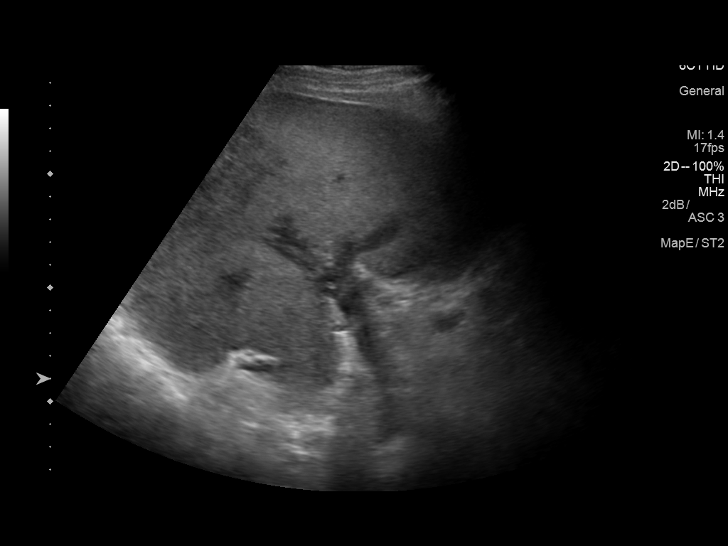

[14 of 25 positions shown; findings below may reference images not displayed]

FINDINGS: Gallbladder:

Surgically absent.

Common bile duct:

Diameter: 5.7 mm, normal.

Liver:

Heterogeneous 7.0 x 7.5 x 6.4 cm lesion in the right hepatic lobe
corresponding to abnormality on CT. There is an internal cystic
components. Hepatic echotexture is heterogeneous, particularly
centrally at the porta hepatis. There is normal directional flow of
the portal vein at the porta hepatis, however possible cut off with
questionable filling defect and surrounding collaterals at the porta
hepatis. The central and left hepatic veins are visualized and are
patent.
IMPRESSION: 1. Questionable filling defect in the portal vein at the porta
hepatis with surrounding collaterals, extrahepatic portal vein
demonstrates normal directional flow.
2. Heterogeneous 7 x 7.5 x 6.4 cm lesion in the right hepatic lobe
with some internal cystic components. Recommend MRI characterization
with contrast.

## 2018-10-28 DIAGNOSIS — Z9089 Acquired absence of other organs: Secondary | ICD-10-CM | POA: Diagnosis not present

## 2018-10-28 DIAGNOSIS — C108 Malignant neoplasm of overlapping sites of oropharynx: Secondary | ICD-10-CM | POA: Diagnosis not present

## 2018-10-28 DIAGNOSIS — Z08 Encounter for follow-up examination after completed treatment for malignant neoplasm: Secondary | ICD-10-CM | POA: Diagnosis not present

## 2018-10-28 DIAGNOSIS — Z923 Personal history of irradiation: Secondary | ICD-10-CM | POA: Diagnosis not present

## 2018-10-28 DIAGNOSIS — Z9889 Other specified postprocedural states: Secondary | ICD-10-CM | POA: Diagnosis not present

## 2018-10-28 DIAGNOSIS — Q381 Ankyloglossia: Secondary | ICD-10-CM | POA: Diagnosis not present

## 2018-10-28 DIAGNOSIS — Z87891 Personal history of nicotine dependence: Secondary | ICD-10-CM | POA: Diagnosis not present

## 2018-10-28 DIAGNOSIS — Z85818 Personal history of malignant neoplasm of other sites of lip, oral cavity, and pharynx: Secondary | ICD-10-CM | POA: Diagnosis not present

## 2018-10-28 DIAGNOSIS — Z85810 Personal history of malignant neoplasm of tongue: Secondary | ICD-10-CM | POA: Diagnosis not present

## 2018-10-29 ENCOUNTER — Encounter: Payer: Self-pay | Admitting: Physician Assistant

## 2018-10-30 MED ORDER — OXYCODONE HCL 5 MG PO TABS: 5.0000 mg | ORAL_TABLET | Freq: Four times a day (QID) | ORAL | 0 refills | Status: DC | PRN

## 2018-10-31 ENCOUNTER — Other Ambulatory Visit (HOSPITAL_COMMUNITY): Payer: Self-pay

## 2018-10-31 MED ORDER — ATORVASTATIN CALCIUM 40 MG PO TABS: 40.0000 mg | ORAL_TABLET | Freq: Every day | ORAL | 3 refills | Status: DC

## 2018-11-12 ENCOUNTER — Other Ambulatory Visit: Payer: Self-pay | Admitting: Emergency Medicine

## 2018-11-12 MED ORDER — BUDESONIDE-FORMOTEROL FUMARATE 160-4.5 MCG/ACT IN AERO: 2.0000 | INHALATION_SPRAY | Freq: Two times a day (BID) | RESPIRATORY_TRACT | 6 refills | Status: DC

## 2018-11-20 ENCOUNTER — Ambulatory Visit
Admission: RE | Admit: 2018-11-20 | Discharge: 2018-11-20 | Disposition: A | Discharge status: Home / Self Care | Payer: Medicare Other | Source: Ambulatory Visit | Attending: Physician Assistant | Admitting: Physician Assistant

## 2018-11-20 DIAGNOSIS — I6529 Occlusion and stenosis of unspecified carotid artery: Secondary | ICD-10-CM

## 2018-11-20 DIAGNOSIS — I6523 Occlusion and stenosis of bilateral carotid arteries: Secondary | ICD-10-CM | POA: Diagnosis not present

## 2018-11-20 DIAGNOSIS — I714 Abdominal aortic aneurysm, without rupture: Secondary | ICD-10-CM

## 2018-11-20 DIAGNOSIS — I7143 Infrarenal abdominal aortic aneurysm, without rupture: Secondary | ICD-10-CM

## 2018-11-21 NOTE — Progress Notes (Signed)
Brandon [name] presents for follow up of radiation completed 03/05/18 to his left tonsil and left neck nodes.    Pain issues, if any: He reports pain to his left jaw when he chews.  Using a feeding tube?: Removed 09/2018. Weight changes, if any: see below.  Swallowing issues, if any: He takes longer to eat due to difficulty manipulating food in his mouth.  Smoking or chewing tobacco? No Using fluoride trays daily? No Last ENT visit was on: Dr. Nicolette Bang 10/28/18. He will see him again in January. Other notable issues, if any:  He underwent scar tissue release and tongue repair with Maloney esophageal dilation on 07/07/18 by Dr. Nicolette Bang.   He continues to have some difficulty manipulating food in his mouth at times related to scar tissue.  He continues to report thick mucous production.   BP (!) 144/82 (BP Location: Left Arm)   Pulse 76   Temp 98.9 F (37.2 C) (Tympanic)   Resp 18   Wt 166 lb 3.2 oz (75.4 kg)   SpO2 98%   BMI 26.03 kg/m    Wt Readings from Last 3 Encounters:  11/25/18 166 lb 3.2 oz (75.4 kg)  08/19/18 168 lb (76.2 kg)  05/23/18 165 lb (74.8 kg)

## 2018-11-24 ENCOUNTER — Other Ambulatory Visit: Payer: Self-pay | Admitting: Emergency Medicine

## 2018-11-24 ENCOUNTER — Telehealth: Payer: Self-pay | Admitting: *Deleted

## 2018-11-24 DIAGNOSIS — I7143 Infrarenal abdominal aortic aneurysm, without rupture: Secondary | ICD-10-CM

## 2018-11-24 DIAGNOSIS — R350 Frequency of micturition: Secondary | ICD-10-CM

## 2018-11-24 DIAGNOSIS — I10 Essential (primary) hypertension: Secondary | ICD-10-CM

## 2018-11-24 DIAGNOSIS — I714 Abdominal aortic aneurysm, without rupture: Secondary | ICD-10-CM

## 2018-11-24 DIAGNOSIS — I6529 Occlusion and stenosis of unspecified carotid artery: Secondary | ICD-10-CM

## 2018-11-24 NOTE — Telephone Encounter (Signed)
CALLED PATIENT TO REMIND OF LAB AND FU ON 11-25-18, SPOKE WITH PATIENT AND HE IS AWARE OF THESE APPTS.

## 2018-11-25 ENCOUNTER — Ambulatory Visit
Admission: RE | Admit: 2018-11-25 | Discharge: 2018-11-25 | Disposition: A | Discharge status: Home / Self Care | Payer: Medicare Other | Source: Ambulatory Visit | Attending: Radiation Oncology | Admitting: Radiation Oncology

## 2018-11-25 ENCOUNTER — Encounter: Payer: Self-pay | Admitting: *Deleted

## 2018-11-25 ENCOUNTER — Other Ambulatory Visit: Payer: Self-pay

## 2018-11-25 ENCOUNTER — Encounter: Payer: Self-pay | Admitting: Radiation Oncology

## 2018-11-25 VITALS — BP 144/82 | HR 76 | Temp 98.9°F | Resp 18 | Wt 166.2 lb

## 2018-11-25 DIAGNOSIS — Z7982 Long term (current) use of aspirin: Secondary | ICD-10-CM | POA: Insufficient documentation

## 2018-11-25 DIAGNOSIS — I890 Lymphedema, not elsewhere classified: Secondary | ICD-10-CM | POA: Diagnosis not present

## 2018-11-25 DIAGNOSIS — G8929 Other chronic pain: Secondary | ICD-10-CM | POA: Insufficient documentation

## 2018-11-25 DIAGNOSIS — Z1329 Encounter for screening for other suspected endocrine disorder: Secondary | ICD-10-CM | POA: Diagnosis not present

## 2018-11-25 DIAGNOSIS — C090 Malignant neoplasm of tonsillar fossa: Secondary | ICD-10-CM | POA: Insufficient documentation

## 2018-11-25 DIAGNOSIS — Z79899 Other long term (current) drug therapy: Secondary | ICD-10-CM | POA: Insufficient documentation

## 2018-11-25 DIAGNOSIS — Z510 Encounter for antineoplastic radiation therapy: Secondary | ICD-10-CM | POA: Insufficient documentation

## 2018-11-25 DIAGNOSIS — Z85818 Personal history of malignant neoplasm of other sites of lip, oral cavity, and pharynx: Secondary | ICD-10-CM | POA: Diagnosis not present

## 2018-11-25 DIAGNOSIS — Z923 Personal history of irradiation: Secondary | ICD-10-CM | POA: Diagnosis not present

## 2018-11-25 DIAGNOSIS — R0982 Postnasal drip: Secondary | ICD-10-CM | POA: Diagnosis not present

## 2018-11-25 DIAGNOSIS — Z08 Encounter for follow-up examination after completed treatment for malignant neoplasm: Secondary | ICD-10-CM | POA: Diagnosis not present

## 2018-11-25 LAB — TSH: TSH: 0.881 u[IU]/mL (ref 0.320–4.118)

## 2018-11-25 NOTE — Progress Notes (Addendum)
Radiation Oncology         [phone] ________________________________  Name: Brandon [name] MRN: [id number]  Date: 11/25/2018  DOB: Aug 07, 1940  Follow-Up outpatient note, in person  CC: Lebron Conners, MD  Diagnosis and Prior Radiotherapy:       ICD-10-CM   1. 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   Cancer Staging Cancer of tonsillar fossa ([place]) Staging form: Pharynx - HPV-Mediated Oropharynx, AJCC 8th Edition - Clinical: Stage I (cT2, cN0, cM0, p16+) - Signed by Tish Men, MD on 11/06/2017 - Pathologic: Stage II (pT3, pN0, cM0, p16+) - Signed by Eppie Gibson, MD on 01/01/2018  Radiation treatment dates:   01/16/2018-03/05/2018  Site/dose:  Left tonsil tumor bed and left neck nodes, total dose 66 Gy in 33 fractions   CHIEF COMPLAINT:   throat cancer  Narrative:  Since his last visit, he underwent release of the tongue from gingiva on 07/07/2018.  He last saw Dr. Nicolette Bang on 10/28/2018 with no evidence of disease on exam.  He is doing relatively well.  He does report some soreness in the left jaw after doing exercises to reduce his trismus.  His trismus has improved significantly.  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   His main complaint is sinus drainage on the left side from his nasal cavity, especially at night.  He has tried various over-the-counter medications recommended by otolaryngology but it continues nevertheless.      ALLERGIES:  is allergic to celebrex [celecoxib].  Meds: Current Outpatient Medications  Medication Sig Dispense Refill  . amLODipine (NORVASC) 10 MG tablet Take 1 tablet (10 mg total) by mouth daily. 90 tablet 3  . aspirin 81 MG EC tablet Take 81 mg by mouth at bedtime.     Marland Kitchen atorvastatin (LIPITOR) 40 MG tablet Take 1 tablet (40 mg total) by mouth daily. 90 tablet 3  . budesonide-formoterol (SYMBICORT) 160-4.5 MCG/ACT inhaler Inhale 2  puffs into the lungs 2 (two) times daily. 10.2 g 6  . citalopram (CELEXA) 20 MG tablet Take 1.5 tablets (30 mg total) by mouth daily. 45 tablet 3  . gabapentin (NEURONTIN) 100 MG capsule Take 1 capsule by mouth twice a day for 3 days, then three times a day 90 capsule 3  . isosorbide mononitrate (IMDUR) 30 MG 24 hr tablet TAKE 1 TABLET(30 MG) BY MOUTH AT BEDTIME 90 tablet 0  . lisinopril (ZESTRIL) 10 MG tablet Take 1 tablet (10 mg total) by mouth daily. 90 tablet 3  . metoprolol tartrate (LOPRESSOR) 25 MG tablet Take 0.5 tablets (12.5 mg total) by mouth 2 (two) times daily. 30 tablet 5  . oxyCODONE (ROXICODONE) 5 MG immediate release tablet Take 1 tablet (5 mg total) by mouth every 6 (six) hours as needed for severe pain. 60 tablet 0  . pantoprazole (PROTONIX) 40 MG tablet Take 1 tablet (40 mg total) by mouth daily. 30 tablet 3   No current facility-administered medications for this encounter.     Physical Findings:   Wt Readings from Last 3 Encounters:  11/25/18 166 lb 3.2 oz (75.4 kg)  08/19/18 168 lb (76.2 kg)  05/23/18 165 lb (74.8 kg)    weight is 166 lb 3.2 oz (75.4 kg). His tympanic temperature is 98.9 F (37.2 C). His blood pressure is 144/82 (abnormal) and his pulse is 76. His respiration is 18 and oxygen saturation is  98%. .   HEENT: No visible tumor in the mouth or oropharynx Neck: Moderate lymphedema and postoperative changes.  No palpable adenopathy Skin: Intact over neck General:  alert and oriented in no acute distress  Lab Findings: Lab Results  Component Value Date   WBC 5.3 02/17/2018   HGB 12.8 (L) 02/17/2018   HCT 38.2 (L) 02/17/2018   MCV 82.1 02/17/2018   PLT 175.0 02/17/2018    Lab Results  Component Value Date   TSH 0.881 11/25/2018    Radiographic Findings: US Carotid Duplex Bilateral  Result Date: 11/20/2018 CLINICAL DATA:  Hypertension, hyperlipidemia EXAM: BILATERAL CAROTID DUPLEX ULTRASOUND TECHNIQUE: Pearline Cables scale imaging, color Doppler and  duplex ultrasound were performed of bilateral carotid and vertebral arteries in the neck. COMPARISON:  None. FINDINGS: Criteria: Quantification of carotid stenosis is based on velocity parameters that correlate the residual internal carotid diameter with NASCET-based stenosis levels, using the diameter of the distal internal carotid lumen as the denominator for stenosis measurement. The following velocity measurements were obtained: RIGHT ICA: 164/37 cm/sec CCA: A999333 cm/sec SYSTOLIC ICA/CCA RATIO:  2.0 ECA: 135 cm/sec LEFT ICA: 117/31 cm/sec CCA: [id number] cm/sec SYSTOLIC ICA/CCA RATIO:  1.5 ECA: 79 cm/sec RIGHT CAROTID ARTERY: Moderate heterogeneous plaque formation with echogenic shadowing calcification. Mid right ICA velocity elevation measuring 164/34 centimeters/second. No significant turbulent flow. By ultrasound criteria, right ICA stenosis estimated at 50-69% (closer to the 50% range). RIGHT VERTEBRAL ARTERY:  Antegrade LEFT CAROTID ARTERY: Similar moderate partially calcified left carotid bifurcation atherosclerosis. Despite this, there is no hemodynamically significant left ICA stenosis, velocity elevation, or significant turbulent flow. Degree of narrowing less than 50% by ultrasound criteria. 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  See above comment. Left ICA narrowing less than 50% Patent antegrade vertebral flow bilaterally Electronically Signed   By: Jerilynn Mages.  Shick M.D.   On: 11/20/2018 15:41   US Aorta  Result Date: 11/20/2018 CLINICAL DATA:  Follow-up abdominal aortic aneurysm EXAM: ULTRASOUND OF ABDOMINAL AORTA TECHNIQUE: Ultrasound examination of the abdominal aorta and proximal common iliac arteries was performed to evaluate for aneurysm. Additional color and Doppler images of the distal aorta were obtained to document patency. COMPARISON:  02/26/2017  FINDINGS: Abdominal aortic measurements as follows: Proximal: Obscured by overlying bowel gas Mid:  3.8 cm cm Distal:  4.8 cm cm Patent: Yes, peak systolic velocity is 99991111 cm/s Right common iliac artery: 1.3 cm Left common iliac artery: 1.1 cm IMPRESSION: Distal abdominal aortic aneurysm similar to that seen on prior CT examination. The measurement is slightly higher than that seen previously. 4.8 versus is 4.3 cm. Recommend followup by abdomen and pelvis CTA in 6 months, and vascular surgery referral/consultation if not already obtained. This recommendation follows ACR consensus guidelines: White Paper of the ACR Incidental Findings Committee II on Vascular Findings. J Am Coll Radiol 2013; 10:789-794. Electronically Signed   By: Inez Catalina M.D.   On: 11/20/2018 14:56    Impression/Plan:    1) Head and Neck Cancer Status: NED  2) Nutritional Status: No acute issues PEG tube: Removed 09/2018  3) Swallowing: Functional - doing SLP exercises  4) Thyroid function: Check annually - WNL Lab Results  Component Value Date   TSH 0.881 11/25/2018    5) Other: f/up as scheduled with ENT  I recommended he try a Netti Pot for the sinus drainage.  This is bothersome to him and if  it does not improve he will talk again with Dr. Nicolette Bang about it at his next follow-up  Continue lymphedema exercises.  6) Follow-up in MAY 2020 with me, sooner PRN.  He will continue to follow with otolaryngology.  In a face-to-face visit lasting 20 minutes, greater than 50% of the time was spent discussing his condition, and coordinating the patient's care.  ____________________________   Eppie Gibson, MD  This document serves as a record of services personally performed by Eppie Gibson, MD. It was created on her behalf by Wilburn Mylar, a trained medical scribe. The creation of this record is based on the scribe's personal observations and the provider's statements to them. This document has been checked and  approved by the attending provider.

## 2018-11-26 ENCOUNTER — Telehealth (HOSPITAL_COMMUNITY): Payer: Self-pay | Admitting: *Deleted

## 2018-11-26 NOTE — Telephone Encounter (Signed)
Entered in error

## 2018-11-26 NOTE — Telephone Encounter (Signed)
Pt left VM requesting Dr.McLean review recent imaging from [facility]. Pt would like an appt with Dr.McLean to discuss imaging if needed. Image reports printed and given to [place] will follow up and schedule appt if Dr.McLean agrees patient needs follow up.

## 2018-11-26 NOTE — Progress Notes (Signed)
Oncology Nurse Navigator Documentation  Met with Brandon [name] during post-tmt follow-up with Dr. Isidore Moos.  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 He reported PEG removed in June,  seeing SLP at [facility] and having periodic MBSS to monitor swallowing function (RTC in November), RTC to see Dr. Nicolette Bang in January.  Reported weight stable at 160-163 lbs. He will RTC to see Dr. Isidore Moos in May.  Gayleen Orem, RN, BSN Head & Neck Oncology Nurse [place] at [place] [phone]

## 2018-11-26 NOTE — Telephone Encounter (Signed)
Per Dr.McLean AAA is larger patient needs referral to [facility] to see Dr.Brabham.  Carotids moderate stenosis repeat in 1 year continue to manage lipids aggressively.  Left VM requesting patient call office to go over recommendations.

## 2018-12-02 ENCOUNTER — Encounter: Payer: Self-pay | Admitting: Physician Assistant

## 2018-12-03 MED ORDER — OXYCODONE HCL 5 MG PO TABS: 5.0000 mg | ORAL_TABLET | Freq: Four times a day (QID) | ORAL | 0 refills | Status: DC | PRN

## 2018-12-23 DIAGNOSIS — R972 Elevated prostate specific antigen [PSA]: Secondary | ICD-10-CM | POA: Diagnosis not present

## 2018-12-23 DIAGNOSIS — R350 Frequency of micturition: Secondary | ICD-10-CM | POA: Diagnosis not present

## 2018-12-23 DIAGNOSIS — R351 Nocturia: Secondary | ICD-10-CM | POA: Diagnosis not present

## 2018-12-23 DIAGNOSIS — N401 Enlarged prostate with lower urinary tract symptoms: Secondary | ICD-10-CM | POA: Diagnosis not present

## 2018-12-24 LAB — PSA: PSA: 9.26

## 2018-12-27 ENCOUNTER — Other Ambulatory Visit: Payer: Self-pay | Admitting: Physician Assistant

## 2018-12-29 ENCOUNTER — Encounter: Payer: Self-pay | Admitting: Physician Assistant

## 2018-12-29 ENCOUNTER — Other Ambulatory Visit: Payer: Self-pay | Admitting: Physician Assistant

## 2018-12-29 ENCOUNTER — Ambulatory Visit (INDEPENDENT_AMBULATORY_CARE_PROVIDER_SITE_OTHER): Payer: Medicare Other | Admitting: Physician Assistant

## 2018-12-29 DIAGNOSIS — G8929 Other chronic pain: Secondary | ICD-10-CM | POA: Diagnosis not present

## 2018-12-29 MED ORDER — OXYCODONE HCL 5 MG PO TABS: 5.0000 mg | ORAL_TABLET | Freq: Four times a day (QID) | ORAL | 0 refills | Status: DC | PRN

## 2018-12-29 NOTE — Progress Notes (Signed)
Virtual Visit via Video   I connected with patient on 12/29/18 at  2:30 PM EST by a video enabled telemedicine application and verified that I am speaking with the correct person using two identifiers.  Location patient: Home Location provider: [facility], Office Persons participating in the virtual visit: Patient, Provider, [place] (Patina Moore)  I discussed the limitations of evaluation and management by telemedicine and the availability of in person appointments. The patient expressed understanding and agreed to proceed.  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 Medication and dose: Oxycodone 10 mg   # pills per month: 45 Last UDS date: 2019 -- Will have him come in for this ASAP Pain contract signed (date): 09/13/2017. Overdue 2/2 COVID. Will renew when he comes in for UDS Date narcotic database last reviewed (include red flags): Today. No red flags.  Pain Inventory (1-10 worse): Average Pain 5 Pain Right Now 4 My pain is dull (character i.e. sharp, stabbing, dull, constant etc)  Pain is worse with: activity Relief from Meds: good  In the last 24 hours, has pain interfered with the following (1-10 greatest interference) ? General activity 2 Relation with others 2 Enjoyment of life 2 What TIME of day is your pain at its worst? evening       Sleep (in general) stable  Mobility/Function: Assistance device: N How many minutes can you walk? 30 Ability to climb steps?  Y Do you drive? Y Disabled (date): N Neuro/Psych Sx: (bladder, bowel, weakness, dizziness, depression etc) Hx depression -- doing well Prior Studies: See EMR Physicians involved in your care: Any changes since last visit?  N  ROS:   See pertinent positives and negatives per HPI.  Patient Active Problem List   Diagnosis Date Noted  . Encounter for chronic pain management 04/15/2018  . Anxiety and depression 04/15/2018  . Oropharyngeal  dysphagia 03/11/2018  . 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 Bruising 10/02/2016  . 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 Aneurysm of infrarenal abdominal aorta (HCC) 07/10/2016  . Normocytic anemia 07/10/2016  . Tobacco abuse disorder 01/10/2014  . Seasonal allergies 11/23/2013  . Essential hypertension 05/19/2013  . Osteoarthritis 05/29/2009  . 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 Aortic valve disorder 01/28/2008  . BRAIN STEM STROKE 08/27/2007  . 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 BENIGN PROSTATIC HYPERTROPHY, HX OF 08/27/2007  . Hyperlipidemia 03/19/2007  . Anxiety state 03/19/2007  . GERD 03/19/2007    Social History   Tobacco Use  . Smoking status: Former Smoker    Packs/day: 1.50    Years: 50.00    Pack years: 75.00    Types: Cigarettes    Quit date: 09/05/2017    Years since quitting: 1.3  . Smokeless tobacco: Never Used  . Tobacco comment: smoked less than 1 ppd for 40+ years; Using Vapor Cig  Substance Use Topics  . Alcohol use: Not Currently    Alcohol/week: 0.0 standard drinks    Comment: 07/19/2016 "might have a few drinks/year"    Current Outpatient Medications:  .  amLODipine (NORVASC) 10 MG tablet, Take 1 tablet (10 mg total) by mouth daily., Disp: 90 tablet, Rfl: 3 .  aspirin 81 MG EC tablet, Take 81 mg by mouth at bedtime. , Disp: , Rfl:  .  atorvastatin (LIPITOR) 40 MG tablet, Take 1 tablet (40 mg total) by mouth daily., Disp: 90 tablet, Rfl: 3 .  budesonide-formoterol (SYMBICORT) 160-4.5 MCG/ACT inhaler, Inhale 2 puffs into  the lungs 2 (two) times daily., Disp: 10.2 g, Rfl: 6 .  citalopram (CELEXA) 20 MG tablet, Take 1.5 tablets (30 mg total) by mouth daily., Disp: 45 tablet, Rfl: 3 .  gabapentin (NEURONTIN) 100 MG capsule, Take 1 capsule by mouth twice a day for 3 days, then three times a day, Disp: 90 capsule, Rfl: 3 .  isosorbide mononitrate (IMDUR) 30 MG 24 hr tablet, TAKE 1 TABLET(30 MG) BY MOUTH AT BEDTIME, Disp: 30 tablet, Rfl: 0 .  metoprolol tartrate  (LOPRESSOR) 25 MG tablet, Take 0.5 tablets (12.5 mg total) by mouth 2 (two) times daily., Disp: 30 tablet, Rfl: 5 .  oxyCODONE (ROXICODONE) 5 MG immediate release tablet, Take 1 tablet (5 mg total) by mouth every 6 (six) hours as needed for severe pain., Disp: 60 tablet, Rfl: 0 .  pantoprazole (PROTONIX) 40 MG tablet, Take 1 tablet (40 mg total) by mouth daily., Disp: 30 tablet, Rfl: 3 .  tamsulosin (FLOMAX) 0.4 MG CAPS capsule, Take 0.4 mg by mouth daily., Disp: , Rfl:  .  lisinopril (ZESTRIL) 10 MG tablet, Take 1 tablet (10 mg total) by mouth daily., Disp: 90 tablet, Rfl: 3  Allergies  Allergen Reactions  . Celebrex [Celecoxib] Hives and Itching    Objective:   There were no vitals taken for this visit.  Patient is well-developed, well-nourished in no acute distress.  Resting comfortably at home.  Head is normocephalic, atraumatic.  No labored breathing.  Speech is clear and coherent with logical content.  Patient is alert and oriented at baseline.   Assessment and Plan:   1. Encounter for chronic pain management Doing well overall. Some braskthrough pain in evening. Is to restart his Gabapentin as directed by Neurology as this will help with back pain. UDS and CSC due to be updated. Will have him come in to office early next week to update these.     Leeanne Rio, PA-C 12/29/2018

## 2018-12-29 NOTE — Progress Notes (Signed)
I have discussed the procedure for the virtual visit with the patient who has given consent to proceed with assessment and treatment.   Marquelle Balow S Sita Mangen, CMA     

## 2019-01-12 ENCOUNTER — Telehealth: Payer: Self-pay | Admitting: Physician Assistant

## 2019-01-12 ENCOUNTER — Other Ambulatory Visit: Payer: Self-pay | Admitting: Physician Assistant

## 2019-01-12 DIAGNOSIS — G8929 Other chronic pain: Secondary | ICD-10-CM

## 2019-01-12 NOTE — Telephone Encounter (Signed)
Unsure about labs -- not sure if he needs for another provider. He is due for UDS which has been orders so he can be placed on nurse schedule for that.

## 2019-01-12 NOTE — Telephone Encounter (Signed)
Patient called wanting to schedule an appt for labs. He states that he has to have lab work done because he is on mediation that require him to get blood work done every three month and he also said that he needed to a give a urine sample. I do not see any orders in patient chart to schedule appt. Patient was seen on 12/29/2018. Patient states that he is busy this week but can come in Friday of this week for blood work.  Please advise.

## 2019-01-19 ENCOUNTER — Encounter: Payer: Self-pay | Admitting: Physician Assistant

## 2019-01-19 ENCOUNTER — Other Ambulatory Visit: Payer: Self-pay | Admitting: Physician Assistant

## 2019-01-19 MED ORDER — OMEPRAZOLE 20 MG PO CPDR: 20.0000 mg | DELAYED_RELEASE_CAPSULE | Freq: Every day | ORAL | 1 refills | Status: DC

## 2019-01-20 ENCOUNTER — Other Ambulatory Visit (HOSPITAL_COMMUNITY): Payer: Self-pay

## 2019-01-20 MED ORDER — METOPROLOL TARTRATE 25 MG PO TABS: 12.5000 mg | ORAL_TABLET | Freq: Two times a day (BID) | ORAL | 5 refills | Status: DC

## 2019-01-21 ENCOUNTER — Telehealth (HOSPITAL_COMMUNITY): Payer: Self-pay

## 2019-01-21 NOTE — Telephone Encounter (Signed)

## 2019-01-22 ENCOUNTER — Ambulatory Visit (HOSPITAL_COMMUNITY)
Admission: RE | Admit: 2019-01-22 | Discharge: 2019-01-22 | Disposition: A | Discharge status: Home / Self Care | Payer: Medicare Other | Source: Ambulatory Visit | Attending: Cardiology | Admitting: Cardiology

## 2019-01-22 ENCOUNTER — Encounter (HOSPITAL_COMMUNITY): Payer: Self-pay | Admitting: Cardiology

## 2019-01-22 ENCOUNTER — Other Ambulatory Visit: Payer: Self-pay

## 2019-01-22 ENCOUNTER — Encounter: Payer: Self-pay | Admitting: Physician Assistant

## 2019-01-22 VITALS — BP 122/62 | HR 72 | Wt 160.6 lb

## 2019-01-22 DIAGNOSIS — K219 Gastro-esophageal reflux disease without esophagitis: Secondary | ICD-10-CM | POA: Diagnosis not present

## 2019-01-22 DIAGNOSIS — R000 Tachycardia, unspecified: Secondary | ICD-10-CM | POA: Diagnosis not present

## 2019-01-22 DIAGNOSIS — Z9049 Acquired absence of other specified parts of digestive tract: Secondary | ICD-10-CM | POA: Insufficient documentation

## 2019-01-22 DIAGNOSIS — Z8249 Family history of ischemic heart disease and other diseases of the circulatory system: Secondary | ICD-10-CM | POA: Diagnosis not present

## 2019-01-22 DIAGNOSIS — Z87891 Personal history of nicotine dependence: Secondary | ICD-10-CM | POA: Diagnosis not present

## 2019-01-22 DIAGNOSIS — I7143 Infrarenal abdominal aortic aneurysm, without rupture: Secondary | ICD-10-CM

## 2019-01-22 DIAGNOSIS — D696 Thrombocytopenia, unspecified: Secondary | ICD-10-CM | POA: Insufficient documentation

## 2019-01-22 DIAGNOSIS — R7309 Other abnormal glucose: Secondary | ICD-10-CM

## 2019-01-22 DIAGNOSIS — Z85818 Personal history of malignant neoplasm of other sites of lip, oral cavity, and pharynx: Secondary | ICD-10-CM | POA: Insufficient documentation

## 2019-01-22 DIAGNOSIS — Z8261 Family history of arthritis: Secondary | ICD-10-CM | POA: Insufficient documentation

## 2019-01-22 DIAGNOSIS — I10 Essential (primary) hypertension: Secondary | ICD-10-CM | POA: Insufficient documentation

## 2019-01-22 DIAGNOSIS — N400 Enlarged prostate without lower urinary tract symptoms: Secondary | ICD-10-CM | POA: Diagnosis not present

## 2019-01-22 DIAGNOSIS — I2510 Atherosclerotic heart disease of native coronary artery without angina pectoris: Secondary | ICD-10-CM | POA: Diagnosis not present

## 2019-01-22 DIAGNOSIS — Z7982 Long term (current) use of aspirin: Secondary | ICD-10-CM | POA: Diagnosis not present

## 2019-01-22 DIAGNOSIS — I714 Abdominal aortic aneurysm, without rupture: Secondary | ICD-10-CM | POA: Diagnosis not present

## 2019-01-22 DIAGNOSIS — Z8673 Personal history of transient ischemic attack (TIA), and cerebral infarction without residual deficits: Secondary | ICD-10-CM | POA: Insufficient documentation

## 2019-01-22 DIAGNOSIS — M1990 Unspecified osteoarthritis, unspecified site: Secondary | ICD-10-CM | POA: Insufficient documentation

## 2019-01-22 DIAGNOSIS — I6521 Occlusion and stenosis of right carotid artery: Secondary | ICD-10-CM | POA: Diagnosis not present

## 2019-01-22 DIAGNOSIS — Z8546 Personal history of malignant neoplasm of prostate: Secondary | ICD-10-CM | POA: Diagnosis not present

## 2019-01-22 DIAGNOSIS — Z79899 Other long term (current) drug therapy: Secondary | ICD-10-CM | POA: Diagnosis not present

## 2019-01-22 DIAGNOSIS — E785 Hyperlipidemia, unspecified: Secondary | ICD-10-CM | POA: Diagnosis not present

## 2019-01-22 DIAGNOSIS — I350 Nonrheumatic aortic (valve) stenosis: Secondary | ICD-10-CM | POA: Diagnosis not present

## 2019-01-22 DIAGNOSIS — C090 Malignant neoplasm of tonsillar fossa: Secondary | ICD-10-CM

## 2019-01-22 LAB — BASIC METABOLIC PANEL
Anion gap: 9 (ref 5–15)
BUN: 10 mg/dL (ref 8–23)
CO2: 25 mmol/L (ref 22–32)
Calcium: 9 mg/dL (ref 8.9–10.3)
Chloride: 105 mmol/L (ref 98–111)
Creatinine, Ser: 0.68 mg/dL (ref 0.61–1.24)
GFR calc Af Amer: >60 mL/min (ref >60)
GFR calc non Af Amer: >60 mL/min (ref >60)
Glucose, Bld: 110 mg/dL — ABNORMAL HIGH (ref 70–99)
Potassium: 3.7 mmol/L (ref 3.5–5.1)
Sodium: 139 mmol/L (ref 135–145)

## 2019-01-22 LAB — HEMOGLOBIN A1C
Hgb A1c MFr Bld: 5.4 % (ref 4.8–5.6)
Mean Plasma Glucose: 108.28 mg/dL

## 2019-01-22 LAB — LIPID PANEL
Cholesterol: 132 mg/dL (ref 0–200)
HDL: 42 mg/dL (ref >40)
LDL Cholesterol: 66 mg/dL (ref 0–99)
Total CHOL/HDL Ratio: 3.1 RATIO
Triglycerides: 120 mg/dL (ref <150)
VLDL: 24 mg/dL (ref 0–40)

## 2019-01-22 NOTE — Patient Instructions (Signed)
No medication changes today!  Labs today We will only contact you if something comes back abnormal or we need to make some changes. Otherwise no news is good news!  You have been referred to the [facility]. They will call you to schedule this appointment  Your physician has requested that you have an echocardiogram. Echocardiography is a painless test that uses sound waves to create images of your heart. It provides your doctor with information about the size and shape of your heart and how well your heart's chambers and valves are working. This procedure takes approximately one hour. There are no restrictions for this procedure.   Your physician recommends that you schedule a follow-up appointment in: May/2021 with Dr Aundra Dubin and an echo.  You will get a call to schedule this appointment.   At the [facility], you and your health needs are our priority. As part of our continuing mission to provide you with exceptional heart care, we have created designated Provider Care Teams. These Care Teams include your primary Cardiologist (physician) and Advanced Practice Providers (APPs- Physician Assistants and Nurse Practitioners) who all work together to provide you with the care you need, when you need it.   You may see any of the following providers on your designated Care Team at your next follow up: Marland Kitchen Dr Glori Bickers . Dr Loralie Champagne . Darrick Grinder, NP . Lyda Jester, PA . Audry Riles, PharmD   Please be sure to bring in all your medications bottles to every appointment.

## 2019-01-22 NOTE — Progress Notes (Signed)
Patient ID: Brandon [name], male   DOB: 10/28/1940, 79 y.o.   MRN: [id number] PCP: Elyn Aquas Cardiology: Dr. Aundra Dubin  78 y.o.with history of CAD, HTN, and hyperlipidemia presents for followup.  Patient had described some exertional chest pain and myoview showed a partially reversible inferior defect, so LHC was done in 7/10.  This showed that his proximal RCA was totally occluded.  There were very robust left to right collaterals.  There was also a 50% proximal LAD stenosis.  The patient was managed medically.    Most recent echo in 5/20 showed EF 65-70% with moderate AS and mild AI.     Earlier in 2018, he developed a Strep intermedius liver abscess.  He had a hepatic drain placed and also had a right pleural effusion requiring diuresis.    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  He is now cancer-free.    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   Returns today for followup of CAD and aortic stenosis. He is doing ok, main complaint is low back pain for which he takes oxycodone.  No chest pain.  No exertional dyspnea.  He stays active and has no significant limitations.   ECG: NSR, normal   Labs (12/10): HDL 28, LDL 70, K 4.3, creatinine 0.8 Labs (4/11): K 4, creatinine 0.8, LDL 53, HDL 26, LFTs normal Labs (12/11): LDL 74, HDL 27, K 4, creatinine 0.9, LFTs normal, TSH normal Labs (7/12): LDL 87, HDL 33 Labs (9/12): LDL 66, HDL 33, LFTs normal Labs (9/13): LDL 62, HDL 30, K 4.1, creatinine 0.8 Labs (4/14): LDL 88, HDL 27, K 4.4, creatinine 0.8 Labs (4/15): K 4.4, creatinine 0.76 Labs (4/16): LDL 69, HDL 32 Labs (11/16): K 4.3, creatinine 0.76, LDL 68, HDL 31 Labs (1/18): LDL 72 Labs (6/18): K 4.2, creatinine 0.6, LFTs normal, hgb 8.1.  Labs (9/18): K 4, creatinine 0.6, LDL 79, HDL 36 Labs (1/19): K 3.9, creatinine 0.67  Allergies (verified):  1)  ! Celebrex  Past Medical History: 1. Hyperlipidemia: Myalgias with atorvastatin and  Crestor. 2. Aortic stenosis.  Echocardiogram in August 2009 showed EF 60%, no regional wall motion abnormalities, mild LVH, moderate focal basal septal hypertrophy, and mild diastolic dysfunction.  There was a very mild aortic stenosis with partially fused left and right coronary cusps and some restricted motion of the aortic valve.  The mean gradient, however, across the aortic valve was only 8 mmHg.  There was also mild left atrial enlargement and normal RV size and function.  Minimal AS on LHC in 7/10.  Echo (7/14) with EF 60-65%, mild LVH, mild AS mean gradient 13 mmHg.  Echo (6/16) with EF 55-60%, mild AS with mean gradient 14 mmHg.  - Echo (6/18): EF 55-60%, mild LVH, moderate AS mean gradient 27 mmHg, mild AI.   - Echo (5/20, [facility]): EF 65-70%, moderate AS, mild AI.  3. Hypertension. 4. Gastroesophageal reflux disease: h/o esophageal stricture.  5. BPH. 6. Diverticulosis. 7. Smoked until 7/10. 8. CAD: LHC (7/10) showed totally occluded proximal RCA with very robust left to right collaterals, 50% proximal LAD stenosis, EF 65%.  Medical management.   9. Osteoarthritis 10. Colonic polyps 11. h/o TIA 12. 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  This manifested with slurred speech. 13. History of low back pain.  The patient is status post surgical back fusion. 14. History of cholecystectomy. 15. Cataract surgery 16. Prostate cancer: treated  17. Liver abscess: Strep intermedius, 2018.  18. AAA: Noted in 8/18 on CT abdomen, 4.3 cm.  - Abdominal US (10/20): 4.8 cm AAA.  19. Carotid stenosis: carotid dopplers 10/18 with 40-59% RICA stenosis.  - Carotid dopplers (10/20): 50-69% RICA stenosis.   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   Family History: Father alive w/ CAD & CABG at [facility] in 2009, had TAVR as well.  Mother died age 20 w/ Alzheimer's disease, hx of DJD 1 Sibling- Brother alive & well w/ DJD in his hips  Social  History: Married- 20rd marriage, wife= Etta 2 children from 1st marriage, 4 step children Former smoker, 40+yrs of >1ppd, quit 7/10.  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    Current Outpatient Medications  Medication Sig Dispense Refill  . amLODipine (NORVASC) 10 MG tablet Take 1 tablet (10 mg total) by mouth daily. 90 tablet 3  . aspirin 81 MG EC tablet Take 81 mg by mouth at bedtime.     Marland Kitchen atorvastatin (LIPITOR) 40 MG tablet Take 1 tablet (40 mg total) by mouth daily. 90 tablet 3  . budesonide-formoterol (SYMBICORT) 160-4.5 MCG/ACT inhaler Inhale 2 puffs into the lungs as needed.    . citalopram (CELEXA) 20 MG tablet Take 1.5 tablets (30 mg total) by mouth daily. 45 tablet 3  . gabapentin (NEURONTIN) 100 MG capsule Take 1 capsule by mouth twice a day for 3 days, then three times a day 90 capsule 3  . hydrocortisone cream 0.5 % Apply topically.    . isosorbide mononitrate (IMDUR) 30 MG 24 hr tablet TAKE 1 TABLET(30 MG) BY MOUTH AT BEDTIME 30 tablet 0  . lisinopril (ZESTRIL) 10 MG tablet Take 1 tablet (10 mg total) by mouth daily. 90 tablet 3  . metoprolol tartrate (LOPRESSOR) 25 MG tablet Take 0.5 tablets (12.5 mg total) by mouth 2 (two) times daily. 30 tablet 5  . omeprazole (PRILOSEC) 20 MG capsule TAKE 1 CAPSULE(20 MG) BY MOUTH DAILY 90 capsule 0  . oxyCODONE (OXY IR/ROXICODONE) 5 MG immediate release tablet Take 5 mg by mouth 2 (two) times daily.    . tamsulosin (FLOMAX) 0.4 MG CAPS capsule Take 0.4 mg by mouth daily.      No current facility-administered medications for this encounter.    BP 122/62   Pulse 72   Wt 72.8 kg (160 lb 9.6 oz)   SpO2 97%   BMI 25.15 kg/m  General: NAD Neck: No JVD, no thyromegaly or thyroid nodule.  Lungs: Clear to auscultation bilaterally with normal respiratory  effort. CV: Nondisplaced PMI.  Heart regular S1/S2, no S3/S4, 3/6 SEM RUSB with clear S2.  No peripheral edema.  Right carotid bruit.  Normal pedal pulses.  Abdomen: Soft, nontender, no hepatosplenomegaly, no distention.  Skin: Intact without lesions or rashes.  Neurologic: Alert and oriented x 3.  Psych: Normal affect. Extremities: No clubbing or cyanosis.  HEENT: Normal.    Assessment/Plan:  1. Aortic stenosis: Moderate by echo in 5/20.  - Echo in 5/21.   2. CAD: Stable. Denies chest pain - Continue atorvastatin and ASA 81.   3. Hyperlipidemia:   - Check lipids today.   4. HTN: BP controlled.  - Continue amlodipine and lisinopril.  BMET today.  5. Abdominal aortic aneurysm: 4.8 cm by [place] in 10/20.  - Refer  for vascular surgery evaluation.  6. Thrombocytopenia: Platelet low chronically.  7. Carotid stenosis: Arrange for repeat carotid dopplers in 10/21.    Followup in 6 months with echo.   Loralie Champagne 01/22/2019

## 2019-01-23 ENCOUNTER — Encounter: Payer: Self-pay | Admitting: Physician Assistant

## 2019-01-23 ENCOUNTER — Ambulatory Visit: Payer: Medicare Other

## 2019-01-23 DIAGNOSIS — G8929 Other chronic pain: Secondary | ICD-10-CM

## 2019-01-23 MED ORDER — GABAPENTIN 100 MG PO CAPS: ORAL_CAPSULE | ORAL | 3 refills | Status: DC

## 2019-01-25 ENCOUNTER — Encounter: Payer: Self-pay | Admitting: Physician Assistant

## 2019-01-25 ENCOUNTER — Other Ambulatory Visit: Payer: Self-pay | Admitting: Physician Assistant

## 2019-01-25 LAB — PAIN MGMT, PROFILE 8 W/CONF, U
6 Acetylmorphine: NEGATIVE ng/mL
Alcohol Metabolites: POSITIVE ng/mL — AB (ref <500)
Amphetamines: NEGATIVE ng/mL
Benzodiazepines: NEGATIVE ng/mL
Buprenorphine, Urine: NEGATIVE ng/mL
Cocaine Metabolite: NEGATIVE ng/mL
Codeine: NEGATIVE ng/mL
Creatinine: 66.7 mg/dL
Ethyl Glucuronide (ETG): 4366 ng/mL
Ethyl Sulfate (ETS): 583 ng/mL
Hydrocodone: NEGATIVE ng/mL
Hydromorphone: NEGATIVE ng/mL
MDMA: NEGATIVE ng/mL
Marijuana Metabolite: NEGATIVE ng/mL
Morphine: NEGATIVE ng/mL
Norhydrocodone: NEGATIVE ng/mL
Noroxycodone: 1592 ng/mL
Opiates: NEGATIVE ng/mL
Oxidant: NEGATIVE ug/mL
Oxycodone: 1552 ng/mL
Oxycodone: POSITIVE ng/mL
Oxymorphone: 1102 ng/mL
pH: 6.4 (ref 4.5–9.0)

## 2019-01-26 MED ORDER — OXYCODONE HCL 5 MG PO TABS: 5.0000 mg | ORAL_TABLET | Freq: Two times a day (BID) | ORAL | 0 refills | Status: DC

## 2019-02-10 DIAGNOSIS — C108 Malignant neoplasm of overlapping sites of oropharynx: Secondary | ICD-10-CM | POA: Diagnosis not present

## 2019-02-10 DIAGNOSIS — Z9889 Other specified postprocedural states: Secondary | ICD-10-CM | POA: Diagnosis not present

## 2019-02-16 ENCOUNTER — Other Ambulatory Visit: Payer: Self-pay | Admitting: Emergency Medicine

## 2019-02-16 MED ORDER — OMEPRAZOLE 20 MG PO CPDR: DELAYED_RELEASE_CAPSULE | ORAL | 1 refills | Status: DC

## 2019-02-18 DIAGNOSIS — R208 Other disturbances of skin sensation: Secondary | ICD-10-CM | POA: Diagnosis not present

## 2019-02-18 DIAGNOSIS — L308 Other specified dermatitis: Secondary | ICD-10-CM | POA: Diagnosis not present

## 2019-02-22 ENCOUNTER — Encounter: Payer: Self-pay | Admitting: Physician Assistant

## 2019-02-24 ENCOUNTER — Encounter: Payer: Self-pay | Admitting: Physician Assistant

## 2019-02-24 DIAGNOSIS — M25532 Pain in left wrist: Secondary | ICD-10-CM | POA: Diagnosis not present

## 2019-02-24 DIAGNOSIS — M25531 Pain in right wrist: Secondary | ICD-10-CM | POA: Diagnosis not present

## 2019-02-24 MED ORDER — OXYCODONE HCL 5 MG PO TABS: 5.0000 mg | ORAL_TABLET | Freq: Three times a day (TID) | ORAL | 0 refills | Status: DC | PRN

## 2019-02-25 ENCOUNTER — Other Ambulatory Visit: Payer: Self-pay

## 2019-02-25 ENCOUNTER — Ambulatory Visit: Payer: Medicare Other | Attending: Internal Medicine

## 2019-02-25 DIAGNOSIS — Z23 Encounter for immunization: Secondary | ICD-10-CM | POA: Insufficient documentation

## 2019-02-25 NOTE — Progress Notes (Signed)
   Covid-19 Vaccination Clinic  Name:  BRIGHAM WIDMAN    MRN: [id number] DOB: Jul 05, 1940  02/25/2019  Mr. Horbal was observed post Covid-19 immunization for 15 minutes without incidence. He was provided with Vaccine Information Sheet and instruction to access the V-Safe system.   Mr. Loran was instructed to call 911 with any severe reactions post vaccine: Marland Kitchen Difficulty breathing  . Swelling of your face and throat  . A fast heartbeat  . A bad rash all over your body  . Dizziness and weakness    Immunizations Administered    Name Date Dose VIS Date Route   Pfizer COVID-19 Vaccine 02/25/2019 10:35 AM 0.3 mL 01/16/2019 Intramuscular   Manufacturer: [place]   Lot: [id number]   [place]: [id number]

## 2019-03-09 DIAGNOSIS — R972 Elevated prostate specific antigen [PSA]: Secondary | ICD-10-CM | POA: Diagnosis not present

## 2019-03-17 DIAGNOSIS — N401 Enlarged prostate with lower urinary tract symptoms: Secondary | ICD-10-CM | POA: Diagnosis not present

## 2019-03-17 DIAGNOSIS — R351 Nocturia: Secondary | ICD-10-CM | POA: Diagnosis not present

## 2019-03-17 DIAGNOSIS — R972 Elevated prostate specific antigen [PSA]: Secondary | ICD-10-CM | POA: Diagnosis not present

## 2019-03-18 ENCOUNTER — Ambulatory Visit: Payer: Medicare Other | Attending: Internal Medicine

## 2019-03-18 DIAGNOSIS — Z23 Encounter for immunization: Secondary | ICD-10-CM | POA: Insufficient documentation

## 2019-03-18 NOTE — Progress Notes (Signed)
   Covid-19 Vaccination Clinic  Name:  Brandon [name]    MRN: [id number] DOB: [birth year]  03/18/2019  [name] was observed post Covid-19 immunization for 15 minutes without incidence. He was provided with Vaccine Information Sheet and instruction to access the V-Safe system.   [name] was instructed to call 911 with any severe reactions post vaccine: Marland Kitchen Difficulty breathing  . Swelling of your face and throat  . A fast heartbeat  . A bad rash all over your body  . Dizziness and weakness    Immunizations Administered    Name Date Dose VIS Date Route   Pfizer COVID-19 Vaccine 03/18/2019  1:55 PM 0.3 mL 01/16/2019 Intramuscular   Manufacturer: [place]   Lot: EN [id number]   Mobile: [id number]

## 2019-03-19 ENCOUNTER — Other Ambulatory Visit: Payer: Self-pay | Admitting: Emergency Medicine

## 2019-03-19 DIAGNOSIS — K219 Gastro-esophageal reflux disease without esophagitis: Secondary | ICD-10-CM

## 2019-03-19 MED ORDER — OMEPRAZOLE 20 MG PO CPDR: DELAYED_RELEASE_CAPSULE | ORAL | 1 refills | Status: DC

## 2019-03-27 ENCOUNTER — Encounter: Payer: Self-pay | Admitting: Physician Assistant

## 2019-03-27 ENCOUNTER — Other Ambulatory Visit: Payer: Self-pay | Admitting: Physician Assistant

## 2019-03-27 MED ORDER — OXYCODONE HCL 5 MG PO TABS: 5.0000 mg | ORAL_TABLET | Freq: Three times a day (TID) | ORAL | 0 refills | Status: DC | PRN

## 2019-04-20 ENCOUNTER — Encounter: Payer: Self-pay | Admitting: Physician Assistant

## 2019-04-20 ENCOUNTER — Other Ambulatory Visit: Payer: Self-pay

## 2019-04-20 ENCOUNTER — Ambulatory Visit (INDEPENDENT_AMBULATORY_CARE_PROVIDER_SITE_OTHER): Payer: Medicare Other | Admitting: Physician Assistant

## 2019-04-20 DIAGNOSIS — M8949 Other hypertrophic osteoarthropathy, multiple sites: Secondary | ICD-10-CM

## 2019-04-20 DIAGNOSIS — J309 Allergic rhinitis, unspecified: Secondary | ICD-10-CM

## 2019-04-20 DIAGNOSIS — G8929 Other chronic pain: Secondary | ICD-10-CM

## 2019-04-20 DIAGNOSIS — M159 Polyosteoarthritis, unspecified: Secondary | ICD-10-CM

## 2019-04-20 MED ORDER — TRIAMCINOLONE ACETONIDE 55 MCG/ACT NA AERO: 2.0000 | INHALATION_SPRAY | Freq: Every day | NASAL | 1 refills | Status: DC

## 2019-04-20 MED ORDER — MONTELUKAST SODIUM 10 MG PO TABS: 10.0000 mg | ORAL_TABLET | Freq: Every day | ORAL | 3 refills | Status: DC

## 2019-04-20 NOTE — Progress Notes (Signed)
Virtual Visit via Video   I connected with patient on 04/20/19 at  1:30 PM EDT by a video enabled telemedicine application and verified that I am speaking with the correct person using two identifiers.  Location patient: Home Location provider: [facility], Office Persons participating in the virtual visit: Patient, Provider, Nance (Patina Moore)  I discussed the limitations of evaluation and management by telemedicine and the availability of in person appointments. The patient expressed understanding and agreed to proceed.  Subjective:   HPI:   Patient presents via D[employer]Me today c/o 4 weeks of PND, sinus pressure, nasal congestion and occasional dry cough, mainly when PND is copious.  Denies fever, chills, chest congestion.  Denies sinus pain, ear pain or tooth pain.  Has had history of increased postnasal drainage since his surgical procedures fro tonsillar cancer. Also with history of allergic rhinitis. Has been using Flonase with some minimal improvement in symptoms.  Patient also would like to discuss deterioration in his osteoarthritic pain.  Is having breakthrough pain despite narcotic pain medicine.  Orthopedics have discussed with him that surgery is the next step.  Patient wants to avoid surgery if at all possible.  We have previously discussed pain management.  He is willing to proceed with referral.  ROS:   See pertinent positives and negatives per HPI.  Patient Active Problem List   Diagnosis Date Noted  . Encounter for chronic pain management 04/15/2018  . Anxiety and depression 04/15/2018  . 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 Cancer of tonsillar fossa ([facility]) 09/20/2017  . Bruising 10/02/2016  . 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 Aneurysm of infrarenal abdominal aorta ([facility]) 07/10/2016  . Normocytic anemia 07/10/2016  . Tobacco abuse disorder 01/10/2014  . Seasonal allergies 11/23/2013  . Essential hypertension 05/19/2013  . Osteoarthritis 05/29/2009  .  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 Aortic valve disorder 01/28/2008  . BRAIN STEM STROKE 08/27/2007  . 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 BENIGN PROSTATIC HYPERTROPHY, HX OF 08/27/2007  . Hyperlipidemia 03/19/2007  . Anxiety state 03/19/2007  . GERD 03/19/2007    Social History   Tobacco Use  . Smoking status: Former Smoker    Packs/day: 1.50    Years: 50.00    Pack years: 75.00    Types: Cigarettes    Quit date: 09/05/2017    Years since quitting: 1.6  . Smokeless tobacco: Never Used  . Tobacco comment: smoked less than 1 ppd for 40+ years; Using Vapor Cig  Substance Use Topics  . Alcohol use: Not Currently    Alcohol/week: 0.0 standard drinks    Comment: 07/19/2016 "might have a few drinks/year"    Current Outpatient Medications:  .  amLODipine (NORVASC) 10 MG tablet, Take 1 tablet (10 mg total) by mouth daily., Disp: 90 tablet, Rfl: 3 .  aspirin 81 MG EC tablet, Take 81 mg by mouth at bedtime. , Disp: , Rfl:  .  atorvastatin (LIPITOR) 40 MG tablet, Take 1 tablet (40 mg total) by mouth daily., Disp: 90 tablet, Rfl: 3 .  budesonide-formoterol (SYMBICORT) 160-4.5 MCG/ACT inhaler, Inhale 2 puffs into the lungs as needed., Disp: , Rfl:  .  citalopram (CELEXA) 20 MG tablet, Take 1.5 tablets (30 mg total) by mouth daily., Disp: 45 tablet, Rfl: 3 .  gabapentin (NEURONTIN) 100 MG capsule, Take 1 capsule by mouth twice a day for 3 days, then three times a day, Disp: 90 capsule, Rfl: 3 .  hydrocortisone cream 0.5 %, Apply topically., Disp: , Rfl:  .  isosorbide  mononitrate (IMDUR) 30 MG 24 hr tablet, TAKE 1 TABLET(30 MG) BY MOUTH AT BEDTIME, Disp: 90 tablet, Rfl: 0 .  lisinopril (ZESTRIL) 10 MG tablet, Take 1 tablet (10 mg total) by mouth daily., Disp: 90 tablet, Rfl: 3 .  metoprolol tartrate (LOPRESSOR) 25 MG tablet, Take 0.5 tablets (12.5 mg total) by mouth 2 (two) times daily., Disp: 30 tablet, Rfl: 5 .  omeprazole (PRILOSEC) 20 MG capsule, TAKE 1 CAPSULE(20 MG) BY MOUTH DAILY, Disp: 90  capsule, Rfl: 1 .  oxyCODONE (OXY IR/ROXICODONE) 5 MG immediate release tablet, Take 1 tablet (5 mg total) by mouth every 8 (eight) hours as needed for severe pain., Disp: 90 tablet, Rfl: 0 .  tamsulosin (FLOMAX) 0.4 MG CAPS capsule, Take 0.4 mg by mouth daily. , Disp: , Rfl:   Allergies  Allergen Reactions  . Celebrex [Celecoxib] Hives and Itching    Objective:   There were no vitals taken for this visit.  Patient is well-developed, well-nourished in no acute distress.  Resting comfortably at home.  Head is normocephalic, atraumatic.  No labored breathing.  Speech is clear and coherent with logical content.  Patient is alert and oriented at baseline.   Assessment and Plan:   1. Chronic allergic rhinitis We will have patient stop Flonase.  Start Nasacort.  Start saline nasal rinses.  Humidifier in bedroom.  Plain Mucinex twice daily.  Keep well-hydrated.  Rx Singulair.  Follow-up with ENT if symptoms not improving over the next 7 to 10 days. - montelukast (SINGULAIR) 10 MG tablet; Take 1 tablet (10 mg total) by mouth at bedtime.  Dispense: 30 tablet; Refill: 3 - triamcinolone (NASACORT) 55 MCG/ACT AERO nasal inhaler; Place 2 sprays into the nose daily.  Dispense: 1 Inhaler; Refill: 1  2. Primary osteoarthritis involving multiple joints 3. Encounter for chronic pain management We will continue his current medication regimen.  Have attempted to add on Celebrex previously with patient had allergic reaction.  Not a good candidate for other NSAIDs due to history of significant GERD.  Discussed topical options.  Patient states he is tried these before and they do not help him.  Is agreeable to pain management referral.  Referral placed. - Ambulatory referral to [place], [place] 04/20/2019

## 2019-04-20 NOTE — Progress Notes (Signed)
I have discussed the procedure for the virtual visit with the patient who has given consent to proceed with assessment and treatment.   Brandon [name] S Adeyemi Hamad, CMA     

## 2019-04-20 NOTE — Patient Instructions (Signed)
Instructions sent to MyChart.  Please keep well-hydrated and try to get plenty of rest. Continue your saline nasal rinses. Start the Flonase and start the Nasacort once daily. Take the Singulair as directed. Consider restarting the plain Mucinex twice daily. If symptoms are not improving would recommend reassessment by your ENT.  Continue pain medication as directed. Can try OTC Voltaren gel to the joints along with your chronic pain medication. I have placed referral to pain management for you. If you do not hear from them within the next couple weeks please let me know  Elbert Ewings in there!

## 2019-04-24 ENCOUNTER — Other Ambulatory Visit: Payer: Self-pay | Admitting: Emergency Medicine

## 2019-04-24 ENCOUNTER — Encounter: Payer: Self-pay | Admitting: Physical Medicine and Rehabilitation

## 2019-04-24 ENCOUNTER — Encounter
Payer: Medicare Other | Attending: Physical Medicine and Rehabilitation | Admitting: Physical Medicine and Rehabilitation

## 2019-04-24 ENCOUNTER — Other Ambulatory Visit: Payer: Self-pay

## 2019-04-24 VITALS — BP 154/74 | HR 78 | Temp 97.7°F | Ht 66.5 in | Wt 169.0 lb

## 2019-04-24 DIAGNOSIS — Z79891 Long term (current) use of opiate analgesic: Secondary | ICD-10-CM | POA: Diagnosis not present

## 2019-04-24 DIAGNOSIS — M5386 Other specified dorsopathies, lumbar region: Secondary | ICD-10-CM | POA: Insufficient documentation

## 2019-04-24 DIAGNOSIS — G8929 Other chronic pain: Secondary | ICD-10-CM | POA: Insufficient documentation

## 2019-04-24 DIAGNOSIS — M159 Polyosteoarthritis, unspecified: Secondary | ICD-10-CM | POA: Insufficient documentation

## 2019-04-24 DIAGNOSIS — M8949 Other hypertrophic osteoarthropathy, multiple sites: Secondary | ICD-10-CM | POA: Insufficient documentation

## 2019-04-24 DIAGNOSIS — Z5181 Encounter for therapeutic drug level monitoring: Secondary | ICD-10-CM | POA: Insufficient documentation

## 2019-04-24 DIAGNOSIS — G894 Chronic pain syndrome: Secondary | ICD-10-CM | POA: Insufficient documentation

## 2019-04-24 MED ORDER — GABAPENTIN 300 MG PO CAPS: 300.0000 mg | ORAL_CAPSULE | Freq: Three times a day (TID) | ORAL | 5 refills | Status: DC

## 2019-04-24 MED ORDER — ISOSORBIDE MONONITRATE ER 30 MG PO TB24: 30.0000 mg | ORAL_TABLET | Freq: Every evening | ORAL | 1 refills | Status: DC

## 2019-04-24 MED ORDER — TRAMADOL HCL 50 MG PO TABS: 100.0000 mg | ORAL_TABLET | Freq: Two times a day (BID) | ORAL | 3 refills | Status: DC

## 2019-04-24 NOTE — Patient Instructions (Signed)
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   1. Suggest strongly- Voltaren gel rub into hands and between fingers on every side up to 4x/day. Is over the counter-   2. Increase Gabapentin to 300 mg TID/3x/day for nerve pain.   3. Usually around the 20th of month- needs to get 1 week's supply from PA then can take over.  4. Suggest increasing Oxycodone 5 mg 4x/day as needed.  5. Add Tramadol 100 mg 2x/day scheduled for longer acting pain meds.   6. Went through list of meds- no interaction  7. Rx given for Tramadol and gabapentin  Wait for pain meds/oxycodone until oral drug screen back; get opiate drug screen.- Can have increase in constipation.    8. F/U in 4-6 weeks-

## 2019-04-24 NOTE — Addendum Note (Signed)
Addended by: Geryl Rankins D on: 04/24/2019 04:15 PM   Modules accepted: Orders

## 2019-04-24 NOTE — Progress Notes (Signed)
Subjective:    Patient ID: Brandon [name], male    DOB: 1940/03/01, 79 y.o.   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 33 sessions of radiation. Is cancer free.   Has a lot of sinus drainage in throat now- learned to eat again  Had trach - gone now.  Has DJD "everywhere".  Went to "arthritis" doctor.  The only way to get hands right was surgery- bone spurs, etc and needs   Sciatic problems for years. In Low back-  DJD and DDD and compression- in lumbar spine- L4/L5 was fusion - was OK for awhile- for 2-3+ years, but then had more pain.  Now having pain in B/L "hips" posterior paraspinals.  Knees also getting bad not so much his hips.   Had epidural steroid injections- didn't work.  Broke out in Hives on Celebrex- and has severe GI reflux with traditional NSAIDs- takes PPI tab in AM- (had to stretch esophagus and stomach due to inflammation)  Takes tylenol occ for pain, but sometimes not enough.   Takes Oxycodone 5 mg TID- works real well- goes away in 5-6 hours and wears off.  Gabapentin tends to extends Oxycodone a little- on 10 mg TID_ used ot be be on gabapentin 300 mg TID when at [place] hurt so bad at night, 7-8 pm hurts so bad, almost cries.  Runs hot water over them- uses Biofreeze and muscle rub- doesn't help much, a little.  Tried Voltaren gel- not sure if used enough to know if helps.  Doesn't think ever tried Tramadol.   Social Hx: Is retired Copywriter, advertising Average Pain 5 Pain Right Now 5 My pain is sharp, dull, stabbing and aching  In the last 24 hours, has pain interfered with the following? General activity 6 Relation with others 6 Enjoyment of life 6 What TIME of day is your pain at its worst? all Sleep (in general) Poor  Pain is worse  with: walking, bending, sitting, inactivity and standing Pain improves with: medication Relief from Meds: 5  Mobility walk without assistance how many minutes can you walk? 20 ability to climb steps?  yes do you drive?  yes  Function retired  Neuro/Psych weakness numbness  Prior Studies Any changes since last visit?  no  Physicians involved in your care Any changes since last visit?  no   Family History  Problem Relation Age of Onset  . Heart disease Father 73       Living  . Coronary artery disease Father        CABG  . Alzheimer's disease Mother 56       Deceased  . Arthritis/Rheumatoid Mother   . Stomach cancer Maternal Uncle   . Brain cancer Maternal Aunt        x2  . Arthritis Brother        DJD  . Obesity Daughter        Had Bypass Sx   Social History   Socioeconomic History  . Marital status: Married    Spouse name: etta  . Number of children: 2  . Years of education: Not on file  . Highest education level: Not on file  Occupational History  .  Occupation: retired    Fish farm manager: RETIRED    Comment: disabled due to back problems  Tobacco Use  . Smoking status: Former Smoker    Packs/day: 1.50    Years: 50.00    Pack years: 75.00    Types: Cigarettes    Quit date: 09/05/2017    Years since quitting: 1.6  . Smokeless tobacco: Never Used  . Tobacco comment: smoked less than 1 ppd for 40+ years; Using Vapor Cig  Substance and Sexual Activity  . Alcohol use: Not Currently    Alcohol/week: 0.0 standard drinks    Comment: 07/19/2016 "might have a few drinks/year"  . Drug use: No  . Sexual activity: Never  Other Topics Concern  . Not on file  Social History Narrative   Married (3rd), [place]. 2 children from 1st marriage, 4 step children.    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       Cell # [phone]   Social Determinants of Health   Financial Resource Strain:    . Difficulty of Paying Living Expenses:   Food Insecurity:   . Worried About Charity fundraiser in the Last Year:   . Arboriculturist in the Last Year:   Transportation Needs:   . Film/video editor (Medical):   Marland Kitchen Lack of Transportation (Non-Medical):   Physical Activity:   . Days of Exercise per Week:   . Minutes of Exercise per Session:   Stress:   . Feeling of Stress :   Social Connections:   . Frequency of Communication with Friends and Family:   . Frequency of Social Gatherings with Friends and Family:   . Attends Religious Services:   . Active Member of Clubs or Organizations:   . Attends Archivist Meetings:   Marland Kitchen Marital Status:    Past Surgical History:  Procedure Laterality Date  . BACK SURGERY    . CATARACT EXTRACTION W/ INTRAOCULAR LENS  IMPLANT, BILATERAL Bilateral 01/2012 - 02/2012  . DIRECT LARYNGOSCOPY Left 10/09/2017   Procedure: DIRECT LARYNGOSCOPY WITH BOPSY;  Surgeon: Jodi Marble, MD;  Location: [place];  Service: ENT;  Laterality: Left;  . ESOPHAGOGASTRODUODENOSCOPY (EGD) WITH ESOPHAGEAL DILATION     "couple times" (07/19/2016)  . ESOPHAGOSCOPY Left 10/09/2017   Procedure: ESOPHAGOSCOPY;  Surgeon: Jodi Marble, MD;  Location: [place];  Service: ENT;  Laterality: Left;  . IR GASTROSTOMY TUBE MOD SED  01/08/2018  . IR THORACENTESIS ASP PLEURAL SPACE W/IMG GUIDE  07/19/2016  . LAPAROSCOPIC CHOLECYSTECTOMY  1994  . LUMBAR DISC SURGERY  05/1996   L4-5; Dr. Sherwood Gambler   . LUMBAR LAMINECTOMY/DECOMPRESSION MICRODISCECTOMY  10/2002   L3-4. Dr. Sherwood Gambler  . MULTIPLE TOOTH EXTRACTIONS  1980s  . PARTIAL GLOSSECTOMY  12/02/2017   Dr. Nicolette Bang- [facility]  . pharyngoplasty for closure of tingue base defect  12/02/2017   Dr. Nicolette Bang- [facility]  . PICC LINE INSERTION  07/15/2016  . POSTERIOR LUMBAR FUSION  09/1996   Ray cage, L4-5 Dr. Rita Ohara  . PROSTATE BIOPSY  ~ 2017  . radical tonsillectomy Left 12/02/2017   [facility] at  [facility]  . RIGID BRONCHOSCOPY Left 10/09/2017   Procedure: RIGID BRONCHOSCOPY;  Surgeon: Jodi Marble, MD;  Location: [place];  Service: ENT;  Laterality: Left;  . [facility]  12[facility]02[facility]2017   [facility]   Past Medical History:  Diagnosis Date  .  Aortic stenosis    mild echocardiogram 8/09 EF 60%, showed no regional wall motion abnml, mild LVH, mod focal basal septal hypertrophy and mild dyastolic dysfunction. 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 mean gradient across aortic valve was 8 mmHG. also mild L atrial enlargement and normal RV size and function. minimal AS on LHC in 7/10.   . Arthritis    "all over" (07/19/2016)  . BPH (benign prostatic hypertrophy)   . CAD (coronary artery disease)    LCH (7/10) totally occluded proximal RCA with very robuse L to R collaterals, 50% proximal LAD stenosis, EF 65%, medical management.    . Chicken pox   . Chronic lower back pain    s/p surgical fusion  . Diverticulosis   . Esophageal stricture   . GERD (gastroesophageal reflux disease)   . Heart murmur   . Hepatitis B 1984  . Hiatal hernia   . History of radiation therapy 01/16/18- 03/05/18   Left Tonsil, 66 Gy in 33 fractions to high risk nodal echelons.   Marland Kitchen HLD (hyperlipidemia)   . HTN (hypertension)   . 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 Osteoarthritis   . TIA (transient ischemic attack) 1990s   hx  . tonsillar ca dx'd 11/2017  . Tubular adenoma of colon 2009   BP (!) 154/74   Pulse 78   Temp 97.7 F (36.5 C)   Ht 5' 6.5" (1.689 m)   Wt 169 lb (76.7 kg)   SpO2 96%   BMI 26.87 kg/m   Opioid Risk Score:   Fall Risk Score:  `1  Depression screen PHQ 2/9  Depression screen [facility] 2/9 04/24/2019 04/20/2019 09/12/2018 05/23/2018 05/30/2017 10/02/2016 09/19/2016  Decreased Interest 1 1 1  0 0 0 0  Down, Depressed, Hopeless 0 1 1 1  0 0 0  PHQ - 2 Score 1 2 2 1  0 0 0  Altered sleeping 1 0 0 1 - - 0  Tired, decreased energy 1 0 0 0  - - 0  Change in appetite 0 0 0 1 - - 0  Feeling bad or failure about yourself  1 0 0 1 - - 0  Trouble concentrating 0 0 0 0 - - 0  Moving slowly or fidgety/restless 0 0 0 0 - - 0  Suicidal thoughts 0 0 0 0 - - 0  PHQ-9 Score 4 2 2 4  - - 0  Difficult doing work/chores - - Somewhat difficult Somewhat difficult - - Not difficult at all  Some recent data might be hidden    Review of Systems  Neurological: Positive for weakness and numbness.  All other systems reviewed and are negative.      Objective:   Physical Exam Awake, alert appropriate, NAD Sitting up on table MS:  LEs- HF 4+/5, KE 4+/5, DF 5-/5, PF 5-/5 B/L- slightly weaker on R, but not a 1/2 level worth Didn't need to check UEs, atrophy o f biceps and triceps B/L  Hands show significantly increased knuckles size, esp 1st MCP on hands B/L and PIPs on both hands- painful, TTP; bruising over entire dorsum of hands B/L  Neuro:  Decreased sensation in R L4/-S1 dermatome Intact sensation otherwise in LEs   No increased DTRs; no clonus B/L      Assessment & Plan:   Patient is a 79 yr old male with hx of BPH, HLD; and HTN and 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 Suggest strongly- Voltaren gel rub into hands and between fingers on every side up to 4x/day.   2. Increase Gabapentin to 300 mg TID/3x/day for nerve pain.   3. Usually around the 20th of month- needs to get 1 week's supply from PA then can take over.  4. Suggest increasing Oxycodone 5 mg 4x/day as needed.  5. Add Tramadol 100 mg 2x/day scheduled for longer acting pain meds.   6. Went through list of meds- no interaction  7. Rx given for Tramadol and gabapentin  Wait for pain meds/oxycodone until oral drug screen back; get opiate drug screen.- Can have increase in constipation.    8. F/U  in 4-6 weeks-   I spent a total of 50 minutes on appointment- more than 30 minutes educating on pain, meds and interactions and opiates as documented above.

## 2019-04-25 ENCOUNTER — Encounter: Payer: Self-pay | Admitting: Physician Assistant

## 2019-04-27 MED ORDER — OXYCODONE HCL 5 MG PO TABS: 5.0000 mg | ORAL_TABLET | Freq: Three times a day (TID) | ORAL | 0 refills | Status: DC | PRN

## 2019-04-29 LAB — DRUG TOX MONITOR 1 W/CONF, ORAL FLD
Amphetamines: NEGATIVE ng/mL (ref <10)
Barbiturates: NEGATIVE ng/mL (ref <10)
Benzodiazepines: NEGATIVE ng/mL (ref <0.50)
Buprenorphine: NEGATIVE ng/mL (ref <0.10)
Cocaine: NEGATIVE ng/mL (ref <5.0)
Codeine: NEGATIVE ng/mL (ref <2.5)
Cotinine: 139.4 ng/mL — ABNORMAL HIGH (ref <5.0)
Dihydrocodeine: NEGATIVE ng/mL (ref <2.5)
Fentanyl: NEGATIVE ng/mL (ref <0.10)
Heroin Metabolite: NEGATIVE ng/mL (ref <1.0)
Hydrocodone: NEGATIVE ng/mL (ref <2.5)
Hydromorphone: NEGATIVE ng/mL (ref <2.5)
MARIJUANA: NEGATIVE ng/mL (ref <2.5)
MDMA: NEGATIVE ng/mL (ref <10)
Meprobamate: NEGATIVE ng/mL (ref <2.5)
Methadone: NEGATIVE ng/mL (ref <5.0)
Morphine: NEGATIVE ng/mL (ref <2.5)
Nicotine Metabolite: POSITIVE ng/mL — AB (ref <5.0)
Norhydrocodone: NEGATIVE ng/mL (ref <2.5)
Noroxycodone: 10.3 ng/mL — ABNORMAL HIGH (ref <2.5)
Opiates: POSITIVE ng/mL — AB (ref <2.5)
Oxycodone: 176.1 ng/mL — ABNORMAL HIGH (ref <2.5)
Oxymorphone: NEGATIVE ng/mL (ref <2.5)
Phencyclidine: NEGATIVE ng/mL (ref <10)
Tapentadol: NEGATIVE ng/mL (ref <5.0)
Tramadol: NEGATIVE ng/mL (ref <5.0)
Zolpidem: NEGATIVE ng/mL (ref <5.0)

## 2019-04-29 LAB — DRUG TOX ALC METAB W/CON, ORAL FLD: Alcohol Metabolite: NEGATIVE ng/mL (ref <25)

## 2019-04-30 ENCOUNTER — Telehealth: Payer: Self-pay

## 2019-04-30 NOTE — Telephone Encounter (Signed)
UDS RESULTS CONSISTENT WITH MEDICATIONS ON FILE  

## 2019-05-06 ENCOUNTER — Telehealth: Payer: Self-pay

## 2019-05-06 MED ORDER — OXYCODONE HCL 5 MG PO TABS: 5.0000 mg | ORAL_TABLET | Freq: Three times a day (TID) | ORAL | 0 refills | Status: DC | PRN

## 2019-05-06 NOTE — Telephone Encounter (Signed)
Renewed Oyx IR 5mg  #90- Drug screen c/w meds of note.

## 2019-05-06 NOTE — Telephone Encounter (Signed)
Patient called requesting refill on Oxycodone, last filled #21 on 04/27/2019.

## 2019-05-12 ENCOUNTER — Telehealth: Payer: Self-pay | Admitting: Physician Assistant

## 2019-05-12 NOTE — Progress Notes (Signed)
  Chronic Care Management   Outreach Note  05/12/2019 Name: Brandon [name] MRN: [id number] DOB: Mar 12, 1940  Referred by: Brunetta Jeans, PA-C Reason for referral : No chief complaint on file.   An unsuccessful telephone outreach was attempted today. The patient was referred to the pharmacist for assistance with care management and care coordination.   Follow Up Plan:   Earney Hamburg Upstream Scheduler

## 2019-05-15 ENCOUNTER — Telehealth: Payer: Self-pay | Admitting: Physician Assistant

## 2019-05-15 NOTE — Progress Notes (Signed)
  Chronic Care Management   Note  05/15/2019 Name: Brandon [name] MRN: [id number] DOB: Aug 14, 1940  Brandon [name] is a 80 y.o. year old male who is a primary care patient of Delorse Limber. I reached out to Brandon [name] by phone today in response to a referral sent by Brandon [name]'s PCP, Brandon Jeans, PA-C.   Brandon [name] was given information about Chronic Care Management services today including:  1. CCM service includes personalized support from designated clinical staff supervised by his physician, including individualized plan of care and coordination with other care providers 2. 24/7 contact phone numbers for assistance for urgent and routine care needs. 3. Service will only be billed when office clinical staff spend 20 minutes or more in a month to coordinate care. 4. Only one practitioner may furnish and bill the service in a calendar month. 5. The patient may stop CCM services at any time (effective at the end of the month) by phone call to the office staff.   Patient agreed to services and verbal consent obtained.   Follow up plan:   Earney Hamburg Upstream Scheduler

## 2019-05-27 ENCOUNTER — Encounter: Payer: Self-pay | Admitting: Physical Medicine and Rehabilitation

## 2019-05-27 ENCOUNTER — Other Ambulatory Visit: Payer: Self-pay

## 2019-05-27 ENCOUNTER — Encounter
Payer: Medicare Other | Attending: Physical Medicine and Rehabilitation | Admitting: Physical Medicine and Rehabilitation

## 2019-05-27 VITALS — BP 128/67 | HR 81 | Temp 97.5°F | Ht 67.0 in | Wt 167.8 lb

## 2019-05-27 DIAGNOSIS — M8949 Other hypertrophic osteoarthropathy, multiple sites: Secondary | ICD-10-CM | POA: Diagnosis not present

## 2019-05-27 DIAGNOSIS — Z79891 Long term (current) use of opiate analgesic: Secondary | ICD-10-CM | POA: Diagnosis not present

## 2019-05-27 DIAGNOSIS — Z5181 Encounter for therapeutic drug level monitoring: Secondary | ICD-10-CM | POA: Insufficient documentation

## 2019-05-27 DIAGNOSIS — M5386 Other specified dorsopathies, lumbar region: Secondary | ICD-10-CM | POA: Insufficient documentation

## 2019-05-27 DIAGNOSIS — G8929 Other chronic pain: Secondary | ICD-10-CM | POA: Insufficient documentation

## 2019-05-27 DIAGNOSIS — M159 Polyosteoarthritis, unspecified: Secondary | ICD-10-CM

## 2019-05-27 DIAGNOSIS — G894 Chronic pain syndrome: Secondary | ICD-10-CM | POA: Diagnosis not present

## 2019-05-27 MED ORDER — OXYCODONE HCL 5 MG PO TABS: 5.0000 mg | ORAL_TABLET | Freq: Four times a day (QID) | ORAL | 0 refills | Status: DC | PRN

## 2019-05-27 MED ORDER — TRAMADOL HCL 50 MG PO TABS: 100.0000 mg | ORAL_TABLET | Freq: Two times a day (BID) | ORAL | 3 refills | Status: DC

## 2019-05-27 NOTE — Progress Notes (Signed)
Subjective:    Patient ID: Brandon [name], male    DOB: 04-15-1940, 79 y.o.   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  Still some pain in hands, but doing better. Likes the tramadol Oxycodone- took this AM- plans to take 1-2pm- and 1 at 7-8pm at night.  Waking up with cramping in back around 1-2 am  Gabapentin helps work with gabapentin Cannot rest well due to back.   L knee has flared up/hurting some more than usual- decided against knee injection.      Pain Inventory Average Pain 3 Pain Right Now 4 My pain is cramping  In the last 24 hours, has pain interfered with the following? General activity 0 Relation with others 0 Enjoyment of life 0 What TIME of day is your pain at its worst? night Sleep (in general) Fair  Pain is worse with: standing Pain improves with: rest and medication Relief from Meds: 9  Mobility walk without assistance ability to climb steps?  yes do you drive?  yes  Function retired  Neuro/Psych spasms  Prior Studies Any changes since last visit?  no  Physicians involved in your care Any changes since last visit?  no   Family History  Problem Relation Age of Onset  . Heart disease Father 69       Living  . Coronary artery disease Father        CABG  . Alzheimer's disease Mother 92       Deceased  . Arthritis/Rheumatoid Mother   . Stomach cancer Maternal Uncle   . Brain cancer Maternal Aunt        x2  . Arthritis Brother        DJD  . Obesity Daughter        Had Bypass Sx   Social History   Socioeconomic History  . Marital status: Married    Spouse name: etta  . Number of children: 2  . Years of education: Not on file  . Highest education level:  Not on file  Occupational History  . Occupation: retired    Fish farm manager: RETIRED    Comment: disabled due to back problems  Tobacco Use  . Smoking status: Former Smoker    Packs/day: 1.50    Years: 50.00    Pack years: 75.00    Types: Cigarettes    Quit date: 09/05/2017    Years since quitting: 1.7  . Smokeless tobacco: Never Used  . Tobacco comment: smoked less than 1 ppd for 40+ years; Using Vapor Cig  Substance and Sexual Activity  . Alcohol use: Not Currently    Alcohol/week: 0.0 standard drinks    Comment: 07/19/2016 "might have a few drinks/year"  . Drug use: No  . Sexual activity: Never  Other Topics Concern  . Not on file  Social History Narrative   Married (3rd), [place]. 2 children from 1st marriage, 4 step children.    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       Cell # [phone]   Social Determinants of Health  Financial Resource Strain:   . Difficulty of Paying Living Expenses:   Food Insecurity:   . Worried About Charity fundraiser in the Last Year:   . Arboriculturist in the Last Year:   Transportation Needs:   . Film/video editor (Medical):   Marland Kitchen Lack of Transportation (Non-Medical):   Physical Activity:   . Days of Exercise per Week:   . Minutes of Exercise per Session:   Stress:   . Feeling of Stress :   Social Connections:   . Frequency of Communication with Friends and Family:   . Frequency of Social Gatherings with Friends and Family:   . Attends Religious Services:   . Active Member of Clubs or Organizations:   . Attends Archivist Meetings:   Marland Kitchen Marital Status:    Past Surgical History:  Procedure Laterality Date  . BACK SURGERY    . CATARACT EXTRACTION W/ INTRAOCULAR LENS  IMPLANT, BILATERAL Bilateral 01/2012 - 02/2012  . DIRECT LARYNGOSCOPY Left 10/09/2017   Procedure: DIRECT LARYNGOSCOPY WITH BOPSY;  Surgeon: Jodi Marble, MD;  Location: [place];  Service: ENT;  Laterality: Left;  . ESOPHAGOGASTRODUODENOSCOPY (EGD) WITH ESOPHAGEAL DILATION     "couple times" (07/19/2016)  . ESOPHAGOSCOPY Left 10/09/2017   Procedure: ESOPHAGOSCOPY;  Surgeon: Jodi Marble, MD;  Location: [place];  Service: ENT;  Laterality: Left;  . IR GASTROSTOMY TUBE MOD SED  01/08/2018  . IR THORACENTESIS ASP PLEURAL SPACE W/IMG GUIDE  07/19/2016  . LAPAROSCOPIC CHOLECYSTECTOMY  1994  . LUMBAR DISC SURGERY  05/1996   L4-5; Dr. Sherwood Gambler   . LUMBAR LAMINECTOMY/DECOMPRESSION MICRODISCECTOMY  10/2002   L3-4. Dr. Sherwood Gambler  . MULTIPLE TOOTH EXTRACTIONS  1980s  . PARTIAL GLOSSECTOMY  12/02/2017   Dr. Nicolette [facility]- [facility]  . pharyngoplasty for closure of tingue base defect  12/02/2017   [facility]ico[facility]  . PICC LINE INSERTION  07/15/2016  . POSTERIOR LUMBAR FUSION  09/1996   Ray cage, L4-5 Dr. Rita Ohara  . PROSTATE BIOPSY  ~ 2017  . radical tonsillectomy Left 12/02/2017   [facility]  . RIGID BRONCHOSCOPY Left 10/09/2017   Procedure: RIGID BRONCHOSCOPY;  Surgeon: Jodi Marble, MD;  Location: [place];  Service: ENT;  Laterality: Left;  . [facility]  [facility]02[facility]2017   [facility]   Past Medical History:  Diagnosis Date  . Aortic stenosis    mild echocardiogram 8/09 EF 60%, showed no regional wall motion abnml, mild LVH, mod focal basal septal hypertrophy and mild dyastolic dysfunction. 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 mean gradient across aortic valve was 8 mmHG. also mild L atrial enlargement and normal RV size and function. minimal AS on LHC in 7/10.   . Arthritis    "all over" (07/19/2016)  . BPH (benign prostatic hypertrophy)   . CAD (coronary artery disease)    LCH (7/10) totally occluded proximal RCA with very robuse L to R collaterals, 50% proximal LAD stenosis, EF 65%, medical management.    . Chicken pox   . Chronic lower back pain    s/p  surgical fusion  . Diverticulosis   . Esophageal stricture   . GERD (gastroesophageal reflux disease)   . Heart murmur   . Hepatitis B 1984  . Hiatal hernia   . History of radiation therapy 01/16/18- 03/05/18   Left Tonsil, 66 Gy in 33 fractions to high risk nodal echelons.   Marland Kitchen  HLD (hyperlipidemia)   . HTN (hypertension)   . 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 Osteoarthritis   . TIA (transient ischemic attack) 1990s   hx  . tonsillar ca dx'd 11/2017  . Tubular adenoma of colon 2009   BP 128/67   Pulse 81   Temp (!) 97.5 F (36.4 C)   Ht 5\' 7"  (1.702 m)   Wt 167 lb 12.8 oz (76.1 kg)   SpO2 97%   BMI 26.28 kg/m   Opioid Risk Score:   Fall Risk Score:  `1  Depression screen PHQ 2/9  Depression screen [facility] 2/9 05/27/2019 04/24/2019 04/20/2019 09/12/2018 05/23/2018 05/30/2017 10/02/2016  Decreased Interest 1 1 1 1  0 0 0  Down, Depressed, Hopeless 0 0 1 1 1  0 0  PHQ - 2 Score 1 1 2 2 1  0 0  Altered sleeping - 1 0 0 1 - -  Tired, decreased energy - 1 0 0 0 - -  Change in appetite - 0 0 0 1 - -  Feeling bad or failure about yourself  - 1 0 0 1 - -  Trouble concentrating - 0 0 0 0 - -  Moving slowly or fidgety/restless - 0 0 0 0 - -  Suicidal thoughts - 0 0 0 0 - -  PHQ-9 Score - 4 2 2 4  - -  Difficult doing work/chores - - - Somewhat difficult Somewhat difficult - -  Some recent data might be hidden    Review of Systems  Constitutional: Negative.   HENT: Negative.   Eyes: Negative.   Respiratory: Negative.   Cardiovascular: Negative.   Gastrointestinal: Negative.   Endocrine: Negative.   Genitourinary: Negative.   Musculoskeletal:       Spasms  Skin: Negative.   Allergic/Immunologic: Negative.   Neurological: Negative.   Hematological: Negative.   Psychiatric/Behavioral: Negative.   All other systems reviewed and are negative.      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 Changed oxycodone to 120 tabs 4x/day- sent in new rx Will increase to 4x/day will help if needs 1am at night if bothersome.   2. Tramadol 100 mg BID changed to 120 tabs a full month  3. Continue Voltaren gel for hands and gabapentin  4. Put oxycodone at bedtime if thinks he will need it.   5. F/U in 8 weeks- earlier if need be.

## 2019-05-27 NOTE — Patient Instructions (Signed)
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 Changed oxycodone to 120 tabs 4x/day- sent in new rx Will increase to 4x/day will help if needs 1am at night if bothersome.   2. Tramadol 100 mg BID changed to 120 tabs a full month  3. Continue Voltaren gel for hands and gabapentin  4. Put oxycodone at bedtime if thinks he will need it.   5. F/U in 8 weeks- earlier if need be.

## 2019-05-29 ENCOUNTER — Other Ambulatory Visit: Payer: Self-pay | Admitting: General Practice

## 2019-05-29 ENCOUNTER — Ambulatory Visit: Payer: Medicare Other | Admitting: Physical Medicine and Rehabilitation

## 2019-05-29 DIAGNOSIS — F411 Generalized anxiety disorder: Secondary | ICD-10-CM

## 2019-05-29 DIAGNOSIS — E7849 Other hyperlipidemia: Secondary | ICD-10-CM

## 2019-05-29 DIAGNOSIS — I10 Essential (primary) hypertension: Secondary | ICD-10-CM

## 2019-06-12 NOTE — Progress Notes (Signed)
Chronic Care Management Pharmacy  Name: Brandon [name]  MRN: [id number] DOB: [birth year]  Chief Complaint/ HPI  Brandon [name],  79 y.o. , male presents for their Initial CCM visit with the clinical pharmacist via telephone due to COVID-19 Pandemic.  PCP : Brunetta Jeans, PA-C.  Their chronic conditions include: HTN, HLD, CAD, TIA, GERD, Chronic Pain Syndrome.   Office Visits: 04/20/19 (PCP): Allergies. Stop Flonase.  Start Nasacort.  Start saline nasal rinses.  Humidifier in bedroom.  Plain Mucinex twice daily.  Keep well-hydrated.  Rx Singulair.  Follow-up with ENT if symptoms not improving over the next 7 to 10 days.  Consider trying OTC voltaren for joint pain.  12/29/19 (PCP): Pain management. 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 Is currently on a regimen of Gabapentin 100 mg BID in addition to his 10 mg Oxycodone. Oxycodone reduced to 5 mg. Pain level in mouth near 0.   Consult Visit: 05/27/19 (Dr. Dagoberto Ligas, PMR): Oxycodone 5 mg every 6 hours (previously q8H). Tramadol increased to 100mg  QID Waking up with cramping in back around 1-2 am. Pain 3, 4 avg. F/u 8 weeks or less if needed.   04/24/19 (Dr. Dagoberto Ligas, PMR): Tramadol 100 mg BID added, Gabapentin increased to 300 mg TID. Voltaren gel on hands/fingers strongly suggested. Pain 5, 5 avg.   Medications: Outpatient Encounter Medications as of 06/15/2019  Medication Sig  . amLODipine (NORVASC) 10 MG tablet Take 1 tablet (10 mg total) by mouth daily.  Marland Kitchen aspirin 81 MG EC tablet Take 81 mg by mouth at bedtime.   Marland Kitchen atorvastatin (LIPITOR) 40 MG tablet Take 1 tablet (40 mg total) by mouth daily.  . budesonide-formoterol (SYMBICORT) 160-4.5 MCG/ACT inhaler Inhale 2 puffs into the lungs as needed.  . gabapentin (NEURONTIN) 300 MG capsule Take 1 capsule (300 mg total) by mouth 3 (three) times daily.  . hydrocortisone cream 0.5 % Apply topically.  . isosorbide mononitrate (IMDUR) 30 MG 24 hr tablet Take 1  tablet (30 mg total) by mouth at bedtime.  Marland Kitchen lisinopril (ZESTRIL) 10 MG tablet Take 1 tablet (10 mg total) by mouth daily.  . metoprolol tartrate (LOPRESSOR) 25 MG tablet Take 0.5 tablets (12.5 mg total) by mouth 2 (two) times daily.  . montelukast (SINGULAIR) 10 MG tablet Take 1 tablet (10 mg total) by mouth at bedtime.  Marland Kitchen omeprazole (PRILOSEC) 20 MG capsule TAKE 1 CAPSULE(20 MG) BY MOUTH DAILY  . oxyCODONE (OXY IR/ROXICODONE) 5 MG immediate release tablet Take 1 tablet (5 mg total) by mouth every 6 (six) hours as needed for severe pain.  . tamsulosin (FLOMAX) 0.4 MG CAPS capsule Take 0.4 mg by mouth daily.   . traMADol (ULTRAM) 50 MG tablet Take 2 tablets (100 mg total) by mouth 2 (two) times daily.  Marland Kitchen triamcinolone (NASACORT) 55 MCG/ACT AERO nasal inhaler Place 2 sprays into the nose daily.   No facility-administered encounter medications on file as of 06/15/2019.   Current Diagnosis/Assessment: Goals Addressed            This Visit's Progress   . PharmD Care Plan       CARE PLAN ENTRY  Current Barriers:  . Chronic Disease Management support, education, and care coordination needs related to Hypertension, Hyperlipidemia, Gastroesophageal Reflux Disease, and pain/osteoarthritis.   Hypertension . Pharmacist Clinical Goal(s): o Over the next 180 days, patient will work with PharmD and providers to maintain BP goal <140/90 . Current regimen:   Amlodipine 10 mg  daily   Lisinopril 10 mg daily in morning  Metoprolol tartrate 12.5 mg BID  Isosorbide mononitrate ER 30 mg daily . Interventions: o Monitoring plan - home monitoring at least once weekly  . Patient self care activities - Over the next 180 days, patient will: o Check BP at home once weekly, document, and provide at future appointments o Ensure daily salt intake < 2300 mg/day  Hyperlipidemia . Pharmacist Clinical Goal(s): o Over the next 180 days, patient will work with PharmD and providers to maintain LDL goal <  70 . Current regimen:  o Atorvastatin 40 mg daily  . Interventions: o Continue current management. . Patient self care activities - Over the next 180 days, patient will: o Continue current management.   Pain . Pharmacist Clinical Goal(s) o Over the next 180 days, patient will work with PharmD and providers to minimize recurring pain.  . Current regimen:   Oxycodone 5 mg every 6 hours as needed  Tramadol 100 mg every 6 hours as needed  Gabapentin 300 mg every 8 hours   Voltaren gel as needed . Interventions: o Continue current management. . Patient self care activities - Over the next 180 days, patient will: o Continue current management.  GERD . Pharmacist Clinical Goal(s) o Over the next 180 days, patient will work with PharmD and providers to minimize any recurring symptoms of acid reflux . Current regimen:  o Omeprazole 20 mg once daily  . Interventions: o No medication changes, continue avoidance of triggers including spicy foods  . Patient self care activities - Over the next 180 days, patient will: o Continue current management.   Medication management . Pharmacist Clinical Goal(s): o Over the next 180 days, patient will work with PharmD and providers to maintain optimal medication adherence . Current pharmacy: [facility] (local and mail order) . Interventions o Comprehensive medication review performed. o Continue current medication management strategy. . Patient self care activities - Over the next 180 days, patient will: o Focus on medication adherence by continuing current management o Take medications as prescribed o Report any questions or concerns to PharmD and/or provider(s) Initial goal documentation.      Hypertension   BP Readings from Last 3 Encounters:  05/27/19 128/67  04/24/19 (!) 154/74  01/22/19 122/62   Patient is currently controlled on the following medications:   Amlodipine 10 mg daily   Lisinopril 10 mg daily in  morning  Metoprolol tartrate 12.5 mg BID  Isosorbide mononitrate ER 30 mg daily   Patient checks BP at home infrequently. We discussed monitoring BP at home at least once weekly. Denies dizziness, SOB, chest pain.  Plan  Continue current medications    Hyperlipidemia   Lipid Panel     Component Value Date/Time   CHOL 132 01/22/2019 1057   TRIG 120 01/22/2019 1057   HDL 42 01/22/2019 1057   CHOLHDL 3.1 01/22/2019 1057   VLDL 24 01/22/2019 1057   LDLCALC 66 01/22/2019 1057   LDLDIRECT 81.2 05/25/2008 0906   at Patient is currently controlled on the following medications: atorvastatin 40 mg daily. Denies any muscle or abdominal pain or n/v.   Also taking aspirin 81 mg for CVD prevention. Denies any abnormal bruising, bleeding from nose or gums or blood in urine or stool. Previous smoker for 20+ years.  Plan  Continue current medications   Pain management   Patient is currently controlled on the following medications:   Oxycodone 5 mg QID  Tramadol 100 mg QID  Gabapentin 300 mg TID  Voltaren gel PRN  Denies sciatic nerve pain, osteo pain still present. Reports that his quality of life is much better due to pain control.   As of 06/15/2019 pain severity is 2 out of 10, on average 2 of 10. Has follow-up with pain management end of 06/2019. Denies any excessive drowsiness, or sleepiness. States oral medications working well but multiple applications of voltaren is difficult due to him often working with hands so can't keep the cream on.  Denies routine constipation, currently using Miralax 1-2x/month as needed to take care of any constipation.   Plan  Continue current medications  BPH    Lab Results  Component Value Date   PSA 9.26 12/24/2018   PSA 4.95 (H) 01/27/2013   PSA 2.71 10/11/2011   Patient is currently controlled on tamsulosin 0.4 mg once daily. PSA elevated 12/2018, biopsy negative for cancer.   Denies urinary symptoms urinary hesitancy, urinary  frequency, incomplete voiding. Denies side effects of tamsulosin including dizziness.  Plan  Continue current medications  Allergies   Patient is currently controlled on the following medications:   Symbicort 160-4.5 mcg/act 2 puffs PRN  Montelukast 10 mg daily  Nasacort 2 sprays daily   Ongoing symptoms of congestion. Has not been taking montelukast 10 mg routinely, encouraged to take daily as next step to improve symptoms. We discussed proper use of Symbicort including rinsing mouth out after the inhalations are done.   Plan  Continue current medications  GERD   Currently controlled on omeprazole 20 mg daily.    Patient denies dysphagia, heartburn or nausea. 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   Expresses understanding to avoid triggers such [id number] sauce and spicy foods.  Plan  Continue current medication.   Vaccines   Reviewed and discussed patient's vaccination history.  Due for the Shingles vaccine.  Immunization History  Administered Date(s) Administered  . Fluad Quad(high Dose 65+) 10/15/2018  . Influenza Split 11/26/2010, 10/11/2011, 11/03/2012  . Influenza Whole 11/14/2006, 12/04/2007, 10/06/2008, 10/25/2009  . Influenza, High Dose Seasonal PF 11/01/2016, 10/30/2017  . Influenza, Seasonal, Injecte, Preservative Fre 12/07/2014  . Influenza,inj,Quad PF,6+ Mos 11/04/2015  . Influenza-Unspecified 11/19/2013  . PFIZER SARS-COV-2 Vaccination 02/25/2019, 03/18/2019  . Pneumococcal Conjugate-13 12/07/2014  . Pneumococcal Polysaccharide-23 05/30/2017   Plan  Recommended patient receive Shingrix vaccine in pharmacy.   Medication Management   Receives all medications from:  [facility] ORDER[facility] [place], [place] [address] Phone: [phone] Fax: [place], [place] [place] [address] Phone: [phone] Fax: [phone]  No issues with medication management at this time.   Plan  Continue current medication management strategy.   Follow up: 3 month phone visit.  ______________ Visit Information SDOH (Social Determinants of Health) assessments performed:  Yes.   Food Insecurity: No Food Insecurity  . Worried About Charity fundraiser in the Last Year: Never true  . Ran Out of Food in the Last Year: Never true     Transportation Needs: No Transportation Needs  . Lack of Transportation (Medical): No  . Lack of Transportation (Non-Medical): No   Mr. Shillingburg was given information about Chronic Care Management services today including:  1. CCM service includes personalized support from designated  clinical staff supervised by his physician, including individualized plan of care and coordination with other care providers 2. 24/7 contact phone numbers for assistance for urgent and routine care needs. 3. Standard insurance, coinsurance, copays and deductibles apply for chronic care management only during months in which we provide at least 20 minutes of these services. Most insurances cover these services at 100%, however patients may be responsible for any copay, coinsurance and/or deductible if applicable. This service may help you avoid the need for more expensive face-to-face services. 4. Only one practitioner may furnish and bill the service in a calendar month. 5. The patient may stop CCM services at any time (effective at the end of the month) by phone call to the office staff.  Patient agreed to services and verbal consent obtained.   Madelin Rear, Pharm.D., BCGP Clinical Pharmacist [facility] at [facility] [phone]

## 2019-06-15 ENCOUNTER — Ambulatory Visit: Payer: Medicare Other

## 2019-06-15 DIAGNOSIS — M159 Polyosteoarthritis, unspecified: Secondary | ICD-10-CM

## 2019-06-15 DIAGNOSIS — M8949 Other hypertrophic osteoarthropathy, multiple sites: Secondary | ICD-10-CM

## 2019-06-15 DIAGNOSIS — K219 Gastro-esophageal reflux disease without esophagitis: Secondary | ICD-10-CM

## 2019-06-15 DIAGNOSIS — F329 Major depressive disorder, single episode, unspecified: Secondary | ICD-10-CM

## 2019-06-15 DIAGNOSIS — E7849 Other hyperlipidemia: Secondary | ICD-10-CM

## 2019-06-15 DIAGNOSIS — I10 Essential (primary) hypertension: Secondary | ICD-10-CM

## 2019-06-15 NOTE — Patient Instructions (Addendum)
Visit Information  Goals Addressed            This Visit's Progress   . PharmD Care Plan       CARE PLAN ENTRY  Current Barriers:  . Chronic Disease Management support, education, and care coordination needs related to Hypertension, Hyperlipidemia, Gastroesophageal Reflux Disease, and pain/osteoarthritis.   Hypertension . Pharmacist Clinical Goal(s): o Over the next 180 days, patient will work with PharmD and providers to maintain BP goal <140/90 . Current regimen:   Amlodipine 10 mg daily   Lisinopril 10 mg daily in morning  Metoprolol tartrate 12.5 mg BID  Isosorbide mononitrate ER 30 mg daily . Interventions: o Monitoring plan - home monitoring at least once weekly  . Patient self care activities - Over the next 180 days, patient will: o Check BP at home once weekly, document, and provide at future appointments o Ensure daily salt intake < 2300 mg/day  Hyperlipidemia . Pharmacist Clinical Goal(s): o Over the next 180 days, patient will work with PharmD and providers to maintain LDL goal < 70 . Current regimen:  o Atorvastatin 40 mg daily  . Interventions: o Continue current management. . Patient self care activities - Over the next 180 days, patient will: o Continue current management.   Pain . Pharmacist Clinical Goal(s) o Over the next 180 days, patient will work with PharmD and providers to minimize recurring pain.  . Current regimen:   Oxycodone 5 mg every 6 hours as needed  Tramadol 100 mg every 6 hours as needed  Gabapentin 300 mg every 8 hours   Voltaren gel as needed . Interventions: o Continue current management. . Patient self care activities - Over the next 180 days, patient will: o Continue current management.  GERD . Pharmacist Clinical Goal(s) o Over the next 180 days, patient will work with PharmD and providers to minimize any recurring symptoms of acid reflux . Current regimen:  o Omeprazole 20 mg once daily  . Interventions: o No  medication changes, continue avoidance of triggers including spicy foods  . Patient self care activities - Over the next 180 days, patient will: o Continue current management.   Medication management . Pharmacist Clinical Goal(s): o Over the next 180 days, patient will work with PharmD and providers to maintain optimal medication adherence . Current pharmacy: [facility] (local and mail order) . Interventions o Comprehensive medication review performed. o Continue current medication management strategy. . Patient self care activities - Over the next 180 days, patient will: o Focus on medication adherence by continuing current management o Take medications as prescribed o Report any questions or concerns to PharmD and/or provider(s) Initial goal documentation.      Brandon [name] was given information about Chronic Care Management services today including:  1. CCM service includes personalized support from designated clinical staff supervised by his physician, including individualized plan of care and coordination with other care providers 2. 24/7 contact phone numbers for assistance for urgent and routine care needs. 3. Standard insurance, coinsurance, copays and deductibles apply for chronic care management only during months in which we provide at least 20 minutes of these services. Most insurances cover these services at 100%, however patients may be responsible for any copay, coinsurance and/or deductible if applicable. This service may help you avoid the need for more expensive face-to-face services. 4. Only one practitioner may furnish and bill the service in a calendar month. 5. The patient may stop CCM services at any time (effective at the end  of the month) by phone call to the office staff.  Patient agreed to services and verbal consent obtained.   The patient verbalized understanding of instructions provided today and agreed to receive a mailed copy of patient instruction and/or  educational materials. Telephone follow up appointment with pharmacy team member scheduled for: See next appointment with "Care Management Staff" under "What's Next" below.   Thank you!  Madelin Rear, Pharm.D., BCGP Clinical Pharmacist [facility] at [facility] [phone] Hypertension, Adult High blood pressure (hypertension) is when the force of blood pumping through the arteries is too strong. The arteries are the blood vessels that carry blood from the heart throughout the body. Hypertension forces the heart to work harder to pump blood and may cause arteries to become narrow or stiff. Untreated or uncontrolled hypertension can cause a heart attack, heart failure, a stroke, kidney disease, and other problems. A blood pressure reading consists of a higher number over a lower number. Ideally, your blood pressure should be below 120/80. The first ("top") number is called the systolic pressure. It is a measure of the pressure in your arteries as your heart beats. The second ("bottom") number is called the diastolic pressure. It is a measure of the pressure in your arteries as the heart relaxes. What are the causes? The exact cause of this condition is not known. There are some conditions that result in or are related to high blood pressure. What increases the risk? Some risk factors for high blood pressure are under your control. The following factors may make you more likely to develop this condition:  Smoking.  Having type 2 diabetes mellitus, high cholesterol, or both.  Not getting enough exercise or physical activity.  Being overweight.  Having too much fat, sugar, calories, or salt (sodium) in your diet.  Drinking too much alcohol. Some risk factors for high blood pressure may be difficult or impossible to change. Some of these factors include:  Having chronic kidney disease.  Having a family history of high blood pressure.  Age. Risk increases with  age.  Race. You may be at higher risk if you are African American.  Gender. Men are at higher risk than women before age 15. After age 44, women are at higher risk than men.  Having obstructive sleep apnea.  Stress. What are the signs or symptoms? High blood pressure may not cause symptoms. Very high blood pressure (hypertensive crisis) may cause:  Headache.  Anxiety.  Shortness of breath.  Nosebleed.  Nausea and vomiting.  Vision changes.  Severe chest pain.  Seizures. How is this diagnosed? This condition is diagnosed by measuring your blood pressure while you are seated, with your arm resting on a flat surface, your legs uncrossed, and your feet flat on the floor. The cuff of the blood pressure monitor will be placed directly against the skin of your upper arm at the level of your heart. It should be measured at least twice using the same arm. Certain conditions can cause a difference in blood pressure between your right and left arms. Certain factors can cause blood pressure readings to be lower or higher than normal for a short period of time:  When your blood pressure is higher when you are in a health care provider's office than when you are at home, this is called white coat hypertension. Most people with this condition do not need medicines.  When your blood pressure is higher at home than when you are in a health  care provider's office, this is called masked hypertension. Most people with this condition may need medicines to control blood pressure. If you have a high blood pressure reading during one visit or you have normal blood pressure with other risk factors, you may be asked to:  Return on a different day to have your blood pressure checked again.  Monitor your blood pressure at home for 1 week or longer. If you are diagnosed with hypertension, you may have other blood or imaging tests to help your health care provider understand your overall risk for other  conditions. How is this treated? This condition is treated by making healthy lifestyle changes, such as eating healthy foods, exercising more, and reducing your alcohol intake. Your health care provider may prescribe medicine if lifestyle changes are not enough to get your blood pressure under control, and if:  Your systolic blood pressure is above 130.  Your diastolic blood pressure is above 80. Your personal target blood pressure may vary depending on your medical conditions, your age, and other factors. Follow these instructions at home: Eating and drinking   Eat a diet that is high in fiber and potassium, and low in sodium, added sugar, and fat. An example eating plan is called the DASH (Dietary Approaches to Stop Hypertension) diet. To eat this way: ? Eat plenty of fresh fruits and vegetables. Try to fill one half of your plate at each meal with fruits and vegetables. ? Eat whole grains, such as whole-wheat pasta, brown rice, or whole-grain bread. Fill about one fourth of your plate with whole grains. ? Eat or drink low-fat dairy products, such as skim milk or low-fat yogurt. ? Avoid fatty cuts of meat, processed or cured meats, and poultry with skin. Fill about one fourth of your plate with lean proteins, such as fish, chicken without skin, beans, eggs, or tofu. ? Avoid pre-made and processed foods. These tend to be higher in sodium, added sugar, and fat.  Reduce your daily sodium intake. Most people with hypertension should eat less than 1,500 mg of sodium a day.  Do not drink alcohol if: ? Your health care provider tells you not to drink. ? You are pregnant, may be pregnant, or are planning to become pregnant.  If you drink alcohol: ? Limit how much you use to:  0-1 drink a day for women.  0-2 drinks a day for men. ? Be aware of how much alcohol is in your drink. In the [place] one drink equals one 12 oz bottle of beer (355 mL), one 5 oz glass of wine (148 mL), or one 1 oz glass  of hard liquor (44 mL). Lifestyle   Work with your health care provider to maintain a healthy body weight or to lose weight. Ask what an ideal weight is for you.  Get at least 30 minutes of exercise most days of the week. Activities may include walking, swimming, or biking.  Include exercise to strengthen your muscles (resistance exercise), such as Pilates or lifting weights, as part of your weekly exercise routine. Try to do these types of exercises for 30 minutes at least 3 days a week.  Do not use any products that contain nicotine or tobacco, such as cigarettes, e-cigarettes, and chewing tobacco. If you need help quitting, ask your health care provider.  Monitor your blood pressure at home as told by your health care provider.  Keep all follow-up visits as told by your health care provider. This is important. Medicines  Take  over-the-counter and prescription medicines only as told by your health care provider. Follow directions carefully. Blood pressure medicines must be taken as prescribed.  Do not skip doses of blood pressure medicine. Doing this puts you at risk for problems and can make the medicine less effective.  Ask your health care provider about side effects or reactions to medicines that you should watch for. Contact a health care provider if you:  Think you are having a reaction to a medicine you are taking.  Have headaches that keep coming back (recurring).  Feel dizzy.  Have swelling in your ankles.  Have trouble with your vision. Get help right away if you:  Develop a severe headache or confusion.  Have unusual weakness or numbness.  Feel faint.  Have severe pain in your chest or abdomen.  Vomit repeatedly.  Have trouble breathing. Summary  Hypertension is when the force of blood pumping through your arteries is too strong. If this condition is not controlled, it may put you at risk for serious complications.  Your personal target blood pressure  may vary depending on your medical conditions, your age, and other factors. For most people, a normal blood pressure is less than 120/80.  Hypertension is treated with lifestyle changes, medicines, or a combination of both. Lifestyle changes include losing weight, eating a healthy, low-sodium diet, exercising more, and limiting alcohol. This information is not intended to replace advice given to you by your health care provider. Make sure you discuss any questions you have with your health care provider. Document Revised: 10/02/2017 Document Reviewed: 10/02/2017 Elsevier Patient Education  2020 [employer].

## 2019-06-25 NOTE — Progress Notes (Signed)
[name] presents for follow up of radiation completed on 03/05/2018 to his left tonsil and left neck nodes.   Pain issues, if any: Chronic issues with arthritis, but manages with prescription pain medication Using a feeding tube?: N/A Weight changes, if any:  Wt Readings from Last 3 Encounters:  06/26/19 169 lb 2 oz (76.7 kg)  05/27/19 167 lb 12.8 oz (76.1 kg)  04/24/19 169 lb (76.7 kg)   Swallowing issues, if any: Patient has to be very mindful of how he swallows because the left side of his throat/jaw is still numb from initial surgery. He states it takes him twice as long to eat because he has to be mindful while swallowing. He does sometimes feel like food get's stuck in his throat and he has to drink a lot of fluids to get it down. Also reports occasional regurgitation of liquids up nose/sinus cavity. Smoking or chewing tobacco? None Using fluoride trays daily? N/A (full set of dentures) Last ENT visit was on: Saw Dr. Francina Ames on 02/10/2019: "He has done well since the last visit. He remains with some issues with eating - foods get hung up sometimes, takes a long time to eat. Bothered by xerostomia. Swallowing most types of foods. Denies pain in the posterior tongue and throat . No bleeding. Has been wearing his lower dentures. Speech is a bit slurred (baseline), no hoarseness. No new palpable neck masses or enlarged lymph nodes . Still with lymphedema. There is no evidence of disease on exam today. Return in July, certainly sooner if there are any questions or problems"  Other notable issues, if any:  Patient mentions he was told surgery was supposed to provide 85% cure rate. He wants to know how will know if the cancer comes back; what should he be monitoring for to then let his doctors know if they occur?  Vitals:   06/26/19 0953  BP: (!) 145/72  Pulse: 67  Resp: 18  Temp: 97.7 F (36.5 C)  SpO2: 99%

## 2019-06-26 ENCOUNTER — Encounter: Payer: Self-pay | Admitting: Radiation Oncology

## 2019-06-26 ENCOUNTER — Telehealth: Payer: Self-pay | Admitting: Medical

## 2019-06-26 ENCOUNTER — Ambulatory Visit
Admission: RE | Admit: 2019-06-26 | Discharge: 2019-06-26 | Disposition: A | Discharge status: Home / Self Care | Payer: Medicare Other | Source: Ambulatory Visit | Attending: Radiation Oncology | Admitting: Radiation Oncology

## 2019-06-26 ENCOUNTER — Other Ambulatory Visit: Payer: Self-pay

## 2019-06-26 VITALS — BP 145/72 | HR 67 | Temp 97.7°F | Resp 18 | Ht 67.0 in | Wt 169.1 lb

## 2019-06-26 DIAGNOSIS — C090 Malignant neoplasm of tonsillar fossa: Secondary | ICD-10-CM

## 2019-06-26 DIAGNOSIS — Z1329 Encounter for screening for other suspected endocrine disorder: Secondary | ICD-10-CM | POA: Diagnosis not present

## 2019-06-26 DIAGNOSIS — Z7982 Long term (current) use of aspirin: Secondary | ICD-10-CM | POA: Diagnosis not present

## 2019-06-26 DIAGNOSIS — Z85819 Personal history of malignant neoplasm of unspecified site of lip, oral cavity, and pharynx: Secondary | ICD-10-CM | POA: Insufficient documentation

## 2019-06-26 DIAGNOSIS — Z79899 Other long term (current) drug therapy: Secondary | ICD-10-CM | POA: Diagnosis not present

## 2019-06-26 DIAGNOSIS — Z8579 Personal history of other malignant neoplasms of lymphoid, hematopoietic and related tissues: Secondary | ICD-10-CM | POA: Diagnosis not present

## 2019-06-26 DIAGNOSIS — I890 Lymphedema, not elsewhere classified: Secondary | ICD-10-CM | POA: Diagnosis not present

## 2019-06-26 DIAGNOSIS — Z08 Encounter for follow-up examination after completed treatment for malignant neoplasm: Secondary | ICD-10-CM | POA: Diagnosis not present

## 2019-06-26 LAB — TSH: TSH: 1.162 u[IU]/mL (ref 0.320–4.118)

## 2019-06-26 NOTE — Progress Notes (Signed)
Radiation Oncology         [phone] ________________________________  Name: Brandon [name] MRN: [id number]  Date: 06/26/2019  DOB: November 08, 1940  Follow-Up outpatient note, in person  CC: Lebron Conners, MD  Diagnosis and Prior Radiotherapy:       ICD-10-CM   1. 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 Screening for hypothyroidism  Z13.29 TSH  Cancer Staging Cancer of tonsillar fossa ([place]) Staging form: Pharynx - HPV-Mediated Oropharynx, AJCC 8th Edition - Clinical: Stage I (cT2, cN0, cM0, p16+) - Signed by Tish Men, MD on 11/06/2017 - Pathologic: Stage II (pT3, pN0, cM0, p16+) - Signed by Eppie Gibson, MD on 01/01/2018  Radiation treatment dates:   01/16/2018-03/05/2018  Site/dose:  Left tonsil tumor bed and left neck nodes, total dose 66 Gy in 33 fractions   CHIEF COMPLAINT:   throat cancer  Narrative:   Mr. Carbin presents for follow up of radiation completed on 03/05/2018 to his left tonsil and left neck nodes.   Pain issues, if any: Chronic issues with arthritis, but manages with prescription pain medication Using a feeding tube?: N/A  Weight changes, if any:   Wt Readings from Last 3 Encounters:  06/26/19 169 lb 2 oz (76.7 kg)  05/27/19 167 lb 12.8 oz (76.1 kg)  04/24/19 169 lb (76.7 kg)   Swallowing issues, if any: Patient has to be very mindful of how he swallows because the left side of his throat/jaw is still numb from initial surgery. He states it takes him twice as long to eat because he has to be mindful while swallowing. He does sometimes feel like food gets stuck in his throat and he has to drink a lot of fluids to get it down. Also reports occasional regurgitation of liquids up nose/sinus cavity.  Smoking or chewing tobacco? None Using fluoride trays daily? N/A (full set of dentures) Last ENT visit was on: Saw Dr. Francina Ames on 02/10/2019, NED return in July  Staying active, enjoying  his dog and work outdoors.  ALLERGIES:  is allergic to celebrex [celecoxib].  Meds: Current Outpatient Medications  Medication Sig Dispense Refill  . amLODipine (NORVASC) 10 MG tablet Take 1 tablet (10 mg total) by mouth daily. 90 tablet 3  . aspirin 81 MG EC tablet Take 81 mg by mouth at bedtime.     Marland Kitchen atorvastatin (LIPITOR) 40 MG tablet Take 1 tablet (40 mg total) by mouth daily. 90 tablet 3  . budesonide-formoterol (SYMBICORT) 160-4.5 MCG/ACT inhaler Inhale 2 puffs into the lungs as needed.    . gabapentin (NEURONTIN) 300 MG capsule Take 1 capsule (300 mg total) by mouth 3 (three) times daily. 90 capsule 5  . hydrocortisone cream 0.5 % Apply topically.    . hydrOXYzine (VISTARIL) 25 MG capsule Take 25 mg by mouth 2 (two) times daily.    . isosorbide mononitrate (IMDUR) 30 MG 24 hr tablet Take 1 tablet (30 mg total) by mouth at bedtime. 90 tablet 1  . lisinopril (ZESTRIL) 10 MG tablet Take 1 tablet (10 mg total) by mouth daily. 90 tablet 3  . metoprolol tartrate (LOPRESSOR) 25 MG tablet Take 0.5 tablets (12.5 mg total) by mouth 2 (two) times daily. 30 tablet 5  . montelukast (SINGULAIR) 10 MG tablet Take 1 tablet (10 mg total) by mouth at bedtime. 30 tablet 3  . omeprazole (PRILOSEC) 20 MG capsule TAKE 1 CAPSULE(20 MG) BY  MOUTH DAILY 90 capsule 1  . oxyCODONE (OXY IR/ROXICODONE) 5 MG immediate release tablet Take 1 tablet (5 mg total) by mouth every 6 (six) hours as needed for severe pain. 120 tablet 0  . tamsulosin (FLOMAX) 0.4 MG CAPS capsule Take 0.4 mg by mouth daily.     . traMADol (ULTRAM) 50 MG tablet Take 2 tablets (100 mg total) by mouth 2 (two) times daily. 120 tablet 3  . triamcinolone (NASACORT) 55 MCG/ACT AERO nasal inhaler Place 2 sprays into the nose daily. 1 Inhaler 1   No current facility-administered medications for this encounter.    Physical Findings:   Wt Readings from Last 3 Encounters:  06/26/19 169 lb 2 oz (76.7 kg)  05/27/19 167 lb 12.8 oz (76.1 kg)   04/24/19 169 lb (76.7 kg)    height is 5\' 7"  (1.702 m) and weight is 169 lb 2 oz (76.7 kg). His temporal temperature is 97.7 F (36.5 C). His blood pressure is 145/72 (abnormal) and his pulse is 67. His respiration is 18 and oxygen saturation is 99%. Marland Kitchen   HEENT: No visible tumor in the mouth or oropharynx - voice is more clear than previous appt. No thrush. Neck: Moderate lymphedema and postoperative changes.  No palpable adenopathy Skin: Intact over neck General:  alert and oriented in no acute distress  Lab Findings: Lab Results  Component Value Date   WBC 5.3 02/17/2018   HGB 12.8 (L) 02/17/2018   HCT 38.2 (L) 02/17/2018   MCV 82.1 02/17/2018   PLT 175.0 02/17/2018    Lab Results  Component Value Date   TSH 1.162 06/26/2019    Radiographic Findings: No results found.  Impression/Plan:    1) Head and Neck Cancer Status: NED  2) Nutritional Status: No acute issues PEG tube: Removed 09/2018  3) Swallowing: Functional - continue SLP exervices  4) Thyroid function: Check annually - WNL Lab Results  Component Value Date   TSH 1.162 06/26/2019    5) Other: f/up as scheduled with ENT.  We discussed potential signs of recurrence to look out for and report if they arise.   6)  For future visits, the plan is for the patient to follow-up in 4 months with survivorship and in 8 months with me  On date of service, in total, I spent 25 minutes on this encounter.  He was seen in person. ____________________________   Eppie Gibson, MD

## 2019-06-26 NOTE — Telephone Encounter (Signed)
Scheduled appt per 5/21 shc message - pt is aware of appt date and time

## 2019-06-29 ENCOUNTER — Encounter: Payer: Self-pay | Admitting: Radiation Oncology

## 2019-07-07 ENCOUNTER — Telehealth: Payer: Self-pay

## 2019-07-07 MED ORDER — OXYCODONE HCL 5 MG PO TABS: 5.0000 mg | ORAL_TABLET | Freq: Four times a day (QID) | ORAL | 0 refills | Status: DC | PRN

## 2019-07-07 NOTE — Telephone Encounter (Signed)
Patient called for a refill on Oxycodone, last filled #90 on 05/06/2019.  He also requested Tramadol and I advised him to call the pharmacy as in there are refills left on prescription.

## 2019-07-07 NOTE — Telephone Encounter (Signed)
Pt reports he needs refill of oxycodone- #120-  Tramadol has refills.  Last refill 05/06/19

## 2019-07-08 ENCOUNTER — Telehealth: Payer: Self-pay | Admitting: Physician Assistant

## 2019-07-08 NOTE — Telephone Encounter (Signed)
Spoke with patient's spouse she stated he was outside cutting grass and call back this afternoon

## 2019-07-14 ENCOUNTER — Telehealth: Payer: Self-pay | Admitting: Physician Assistant

## 2019-07-14 NOTE — Telephone Encounter (Signed)
Left message for patient to schedule Annual Wellness Visit.  Please schedule with Nurse Health Advisor Martha Stanley, RN at [facility]  

## 2019-07-15 ENCOUNTER — Other Ambulatory Visit (HOSPITAL_COMMUNITY): Payer: Self-pay

## 2019-07-15 MED ORDER — AMLODIPINE BESYLATE 10 MG PO TABS: 10.0000 mg | ORAL_TABLET | Freq: Every day | ORAL | 3 refills | Status: DC

## 2019-07-22 ENCOUNTER — Ambulatory Visit: Payer: Medicare Other | Admitting: Physical Medicine and Rehabilitation

## 2019-07-29 ENCOUNTER — Other Ambulatory Visit: Payer: Self-pay

## 2019-07-29 ENCOUNTER — Encounter: Payer: Self-pay | Admitting: Physical Medicine and Rehabilitation

## 2019-07-29 ENCOUNTER — Encounter
Payer: Medicare Other | Attending: Physical Medicine and Rehabilitation | Admitting: Physical Medicine and Rehabilitation

## 2019-07-29 VITALS — BP 132/69 | HR 82 | Temp 97.7°F | Ht 68.0 in | Wt 170.0 lb

## 2019-07-29 DIAGNOSIS — Z79891 Long term (current) use of opiate analgesic: Secondary | ICD-10-CM | POA: Insufficient documentation

## 2019-07-29 DIAGNOSIS — M25511 Pain in right shoulder: Secondary | ICD-10-CM | POA: Diagnosis not present

## 2019-07-29 DIAGNOSIS — M5386 Other specified dorsopathies, lumbar region: Secondary | ICD-10-CM

## 2019-07-29 DIAGNOSIS — M25551 Pain in right hip: Secondary | ICD-10-CM

## 2019-07-29 DIAGNOSIS — G894 Chronic pain syndrome: Secondary | ICD-10-CM | POA: Insufficient documentation

## 2019-07-29 DIAGNOSIS — Z5181 Encounter for therapeutic drug level monitoring: Secondary | ICD-10-CM | POA: Insufficient documentation

## 2019-07-29 DIAGNOSIS — M8949 Other hypertrophic osteoarthropathy, multiple sites: Secondary | ICD-10-CM | POA: Diagnosis not present

## 2019-07-29 DIAGNOSIS — M25512 Pain in left shoulder: Secondary | ICD-10-CM

## 2019-07-29 DIAGNOSIS — G8929 Other chronic pain: Secondary | ICD-10-CM | POA: Diagnosis not present

## 2019-07-29 MED ORDER — MIRTAZAPINE 30 MG PO TABS: 30.0000 mg | ORAL_TABLET | Freq: Every day | ORAL | 3 refills | Status: DC

## 2019-07-29 MED ORDER — DICLOFENAC SODIUM 50 MG PO TBEC: 50.0000 mg | DELAYED_RELEASE_TABLET | Freq: Two times a day (BID) | ORAL | 0 refills | Status: DC

## 2019-07-29 MED ORDER — OXYCODONE HCL 5 MG PO TABS: 5.0000 mg | ORAL_TABLET | Freq: Four times a day (QID) | ORAL | 0 refills | Status: DC | PRN

## 2019-07-29 NOTE — Progress Notes (Signed)
Subjective:    Patient ID: Brandon [name], male    DOB: May 08, 1940, 79 y.o.   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 Arthritis not as bad as it was.  Was able to button shirt without too much issue this AM.   Has him on Oxycodone 4 tabs/day Up until 2-3 weeks ago, is having to take the 4th tab just last few weeks- before that, only took 3 tabs/day.  Still taking tramadol 100 mg BID     Has a problem with B/L shoulders and R hip- in groin. When reaches across chest- has increased pain- and raises it up, also hurts.  More in R>L  Never had problems with hip joints, however last 4 weeks, has been hurting really bad.   Has been trying to do some walking after pain meds.  Doesn't hurt as bad, if gets exercise.   Has had shots into shoulders before -didn't really help.    Taste buds are "bad"- doesn't taste like things did before. After 3rd bite, can't taste food. Some food can taste, some can't.   Does 1 protein shake/day  35 protein/shake.   Not eating much- poor appetite- with poor intake Not swallowing great since last surgery.  Had trach in past and open wound on neck from radiation.    Has good kidney function- last BUN 10 and Cr 0.68    Got COVID vaccine a few months ago.    Pain Inventory Average Pain 4 Pain Right Now 5 My pain is aching  In the last 24 hours, has pain interfered with the following? General activity 4 Relation with others 5 Enjoyment of life 5 What TIME of day is your pain at its worst? morning Sleep (in general) Fair  Pain is worse with: walking, bending and sitting Pain improves with: medication Relief from Meds: 7  Mobility walk without  assistance  Function retired  Neuro/Psych No problems in this area  Prior Studies Any changes since last visit?  no  Physicians involved in your care Any changes since last visit?  no   Family History  Problem Relation Age of Onset  . Heart disease Father 59       Living  . Coronary artery disease Father        CABG  . Alzheimer's disease Mother 105       Deceased  . Arthritis/Rheumatoid Mother   . Stomach cancer Maternal Uncle   . Brain cancer Maternal Aunt        x2  . Arthritis Brother        DJD  . Obesity Daughter        Had Bypass Sx   Social History   Socioeconomic History  . Marital status: Married    Spouse name: etta  . Number of children: 2  . Years of education: Not on file  . Highest education level: Not on file  Occupational History  . Occupation: retired    Fish farm manager: RETIRED    Comment: disabled due to back problems  Tobacco Use  . Smoking status: Former Smoker    Packs/day: 1.50    Years:  50.00    Pack years: 75.00    Types: Cigarettes    Quit date: 09/05/2017    Years since quitting: 1.8  . Smokeless tobacco: Never Used  . Tobacco comment: smoked less than 1 ppd for 40+ years; Using Vapor Cig  Vaping Use  . Vaping Use: Never used  Substance and Sexual Activity  . Alcohol use: Not Currently    Alcohol/week: 0.0 standard drinks    Comment: 07/19/2016 "might have a few drinks/year"  . Drug use: No  . Sexual activity: Never  Other Topics Concern  . Not on file  Social History Narrative   Married (3rd), [place]. 2 children from 1st marriage, 4 step children.    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       Cell # [phone]   Social Determinants of Health   Financial Resource Strain:   . Difficulty of Paying Living Expenses:   Food Insecurity: No Food Insecurity  . Worried About Charity fundraiser in the Last Year: Never true  . Ran Out of Food in the  Last Year: Never true  Transportation Needs: No Transportation Needs  . Lack of Transportation (Medical): No  . Lack of Transportation (Non-Medical): No  Physical Activity:   . Days of Exercise per Week:   . Minutes of Exercise per Session:   Stress:   . Feeling of Stress :   Social Connections:   . Frequency of Communication with Friends and Family:   . Frequency of Social Gatherings with Friends and Family:   . Attends Religious Services:   . Active Member of Clubs or Organizations:   . Attends Archivist Meetings:   Marland Kitchen Marital Status:    Past Surgical History:  Procedure Laterality Date  . BACK SURGERY    . CATARACT EXTRACTION W/ INTRAOCULAR LENS  IMPLANT, BILATERAL Bilateral 01/2012 - 02/2012  . DIRECT LARYNGOSCOPY Left 10/09/2017   Procedure: DIRECT LARYNGOSCOPY WITH BOPSY;  Surgeon: Jodi Marble, MD;  Location: [facility];  Service: ENT;  Laterality: Left;  . ESOPHAGOGASTRODUODENOSCOPY (EGD) WITH ESOPHAGEAL DILATION     "couple times" (07/19/2016)  . ESOPHAGOSCOPY Left 10/09/2017   Procedure: ESOPHAGOSCOPY;  Surgeon: Jodi Marble, MD;  Location: [facility];  Service: ENT;  Laterality: Left;  . IR GASTROSTOMY TUBE MOD SED  01/08/2018  . IR THORACENTESIS ASP PLEURAL SPACE W/IMG GUIDE  07/19/2016  . LAPAROSCOPIC CHOLECYSTECTOMY  1994  . LUMBAR DISC SURGERY  05/1996   L4-5; Dr. Sherwood Gambler   . LUMBAR LAMINECTOMY/DECOMPRESSION MICRODISCECTOMY  10/2002   L3-4. Dr. Sherwood Gambler  . MULTIPLE TOOTH EXTRACTIONS  1980s  . PARTIAL GLOSSECTOMY  12/02/2017   Dr. Nicolette Bang- [facility]  . pharyngoplasty for closure of tingue base defect  12/02/2017   Dr. Nicolette Bang- [facility]  . PICC LINE INSERTION  07/15/2016  . POSTERIOR LUMBAR FUSION  09/1996   Ray cage, L4-5 Dr. Rita Ohara  . PROSTATE BIOPSY  ~ 2017  . radical tonsillectomy Left 12/02/2017   Dr. [facility]ico[facility] at [facility]  . RIGID BRONCHOSCOPY Left 10/09/2017   Procedure: RIGID BRONCHOSCOPY;  Surgeon: Jodi Marble, MD;   Location: [place];  Service: ENT;  Laterality: Left;  . [facility]   Past Medical History:  Diagnosis Date  . Aortic stenosis    mild echocardiogram 8/09 EF 60%, showed no regional wall motion abnml, mild  LVH, mod focal basal septal hypertrophy and mild dyastolic dysfunction. 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 mean gradient across aortic valve was 8 mmHG. also mild L atrial enlargement and normal RV size and function. minimal AS on LHC in 7/10.   . Arthritis    "all over" (07/19/2016)  . BPH (benign prostatic hypertrophy)   . CAD (coronary artery disease)    LCH (7/10) totally occluded proximal RCA with very robuse L to R collaterals, 50% proximal LAD stenosis, EF 65%, medical management.    . Chicken pox   . Chronic lower back pain    s/p surgical fusion  . Diverticulosis   . Esophageal stricture   . GERD (gastroesophageal reflux disease)   . Heart murmur   . Hepatitis B 1984  . Hiatal hernia   . History of radiation therapy 01/16/18- 03/05/18   Left Tonsil, 66 Gy in 33 fractions to high risk nodal echelons.   Marland Kitchen HLD (hyperlipidemia)   . HTN (hypertension)   . 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 Osteoarthritis   . TIA (transient ischemic attack) 1990s   hx  . tonsillar ca dx'd 11/2017  . Tubular adenoma of colon 2009   BP 132/69   Pulse 82   Temp 97.7 F (36.5 C)   Ht 5\' 8"  (1.727 m)   Wt 170 lb (77.1 kg)   SpO2 96%   BMI 25.85 kg/m   Opioid Risk Score:   Fall Risk Score:  `1  Depression screen PHQ 2/9  Depression screen [facility] 2/9 07/29/2019 05/27/2019 04/24/2019 04/20/2019 09/12/2018 05/23/2018 05/30/2017  Decreased Interest 1 1 1 1 1  0 0  Down, Depressed, Hopeless 0 0 0 1 1 1  0  PHQ - 2 Score 1 1 1 2 2 1  0  Altered sleeping - - 1 0 0 1 -  Tired, decreased energy - - 1 0 0 0 -  Change in appetite - - 0 0 0 1 -  Feeling bad or failure about yourself  - - 1 0 0 1 -  Trouble  concentrating - - 0 0 0 0 -  Moving slowly or fidgety/restless - - 0 0 0 0 -  Suicidal thoughts - - 0 0 0 0 -  PHQ-9 Score - - 4 2 2 4  -  Difficult doing work/chores - - - - Somewhat difficult Somewhat difficult -  Some recent data might be hidden    Review of Systems  Constitutional: Negative.   HENT: Negative.   Eyes: Negative.   Respiratory: Positive for cough.   Cardiovascular: Negative.   Gastrointestinal: Negative.   Endocrine: Negative.   Genitourinary: Negative.   Musculoskeletal: Negative.   Skin: Negative.   Allergic/Immunologic: Negative.   Neurological: Negative.   Hematological: Negative.   Psychiatric/Behavioral: Negative.   All other systems reviewed and are negative.      Objective:   Physical Exam  Awake, alert, appropriate, sitting on table, NAD Internal rotation of R hip causing pain- not flexion/or external rotation. No TTP over lateral hip R>>>L- impingement syndrome- noted by empty can test on R Mild TTP over rotator cuff/posteiror shoulder Mild TTP - bicpital tendon origin       Assessment & Plan:   Patient is a 79 yr old male with hx of BPH, HLD; and HTN and severe DJD  in many joints on oxycodone chronically here for f/u- hx of throat cancer- . 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 Doesn't want injections since didn't help in past.   2. Had allergy to Celebrex- but other NSAIDs are OK  3. Will try Diclofenac 50 mg 2x/day x 14 days- to try and help reduce inflammation- for shoulder and hip pain.   4. If pain in hips/shoulder pain doesn't timprove, will try steroid taper.  5. Weight has stayed stable-  Remeron/Mirtazpine for appetite-30 mg nightly- if makes too sleepy, call me, can try Periactin/Cyprohepatadine.   6.  Refill Oxycodone 5 mg QID #120 - early refill since on vacation next week.   7. F/U in 8 weeks- call if diclofenac not  enough.    I spent a total of 35 minutes on appointment- as detailed above.

## 2019-07-29 NOTE — Patient Instructions (Signed)
Patient is a 79 yr old male with hx of BPH, HLD; and HTN and severe DJD  in many joints on oxycodone chronically here for f/u- hx of throat cancer- . 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 Doesn't want injections since didn't help in past.   2. Had allergy to Celebrex- but other NSAIDs are OK  3. Will try Diclofenac 50 mg 2x/day x 14 days- to try and help reduce inflammation- for shoulder and hip pain.   4. If pain in hips/shoulder pain doesn't timprove, will try steroid taper.  5. Weight has stayed stable-  Remeron/Mirtazpine for appetite-30 mg nightly- if makes too sleepy, call me, can try Periactin/Cyprohepatadine.   6.  Refill Oxycodone 5 mg QID #120 - early refill since on vacation next week.   7. F/U in 8 weeks- call if diclofenac not enough.

## 2019-08-10 ENCOUNTER — Other Ambulatory Visit: Payer: Self-pay | Admitting: Physical Medicine and Rehabilitation

## 2019-08-11 DIAGNOSIS — Z923 Personal history of irradiation: Secondary | ICD-10-CM | POA: Diagnosis not present

## 2019-08-11 DIAGNOSIS — Z85810 Personal history of malignant neoplasm of tongue: Secondary | ICD-10-CM | POA: Diagnosis not present

## 2019-08-11 DIAGNOSIS — Z87891 Personal history of nicotine dependence: Secondary | ICD-10-CM | POA: Diagnosis not present

## 2019-08-11 DIAGNOSIS — C108 Malignant neoplasm of overlapping sites of oropharynx: Secondary | ICD-10-CM | POA: Diagnosis not present

## 2019-08-11 DIAGNOSIS — Z85818 Personal history of malignant neoplasm of other sites of lip, oral cavity, and pharynx: Secondary | ICD-10-CM | POA: Diagnosis not present

## 2019-08-11 DIAGNOSIS — Z9889 Other specified postprocedural states: Secondary | ICD-10-CM | POA: Diagnosis not present

## 2019-08-11 DIAGNOSIS — Z08 Encounter for follow-up examination after completed treatment for malignant neoplasm: Secondary | ICD-10-CM | POA: Diagnosis not present

## 2019-08-13 ENCOUNTER — Other Ambulatory Visit (HOSPITAL_COMMUNITY): Payer: Self-pay | Admitting: *Deleted

## 2019-08-13 MED ORDER — METOPROLOL TARTRATE 25 MG PO TABS: 12.5000 mg | ORAL_TABLET | Freq: Two times a day (BID) | ORAL | 5 refills | Status: DC

## 2019-08-14 ENCOUNTER — Other Ambulatory Visit (HOSPITAL_COMMUNITY): Payer: Self-pay | Admitting: *Deleted

## 2019-08-15 ENCOUNTER — Other Ambulatory Visit: Payer: Self-pay | Admitting: Physician Assistant

## 2019-08-15 DIAGNOSIS — J309 Allergic rhinitis, unspecified: Secondary | ICD-10-CM

## 2019-08-25 ENCOUNTER — Other Ambulatory Visit: Payer: Self-pay | Admitting: Physical Medicine and Rehabilitation

## 2019-08-25 NOTE — Telephone Encounter (Signed)
Electronic request for refill of Diclofenac Sodium Tabs.  Last clinic note stated:  Patient is a 79 yr old male with hx of BPH, HLD; and HTN and severe DJD  in many joints on oxycodone chronically here for f/u- hx of throat cancer- . 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 Doesn't want injections since didn't help in past.   2. Had allergy to Celebrex- but other NSAIDs are OK  3. Will try Diclofenac 50 mg 2x/day x 14 days- to try and help reduce inflammation- for shoulder and hip pain.   4. If pain in hips/shoulder pain doesn't timprove, will try steroid taper.  5. Weight has stayed stable-  Remeron/Mirtazpine for appetite-30 mg nightly- if makes too sleepy, call me, can try Periactin/Cyprohepatadine.   6.  Refill Oxycodone 5 mg QID #120 - early refill since on vacation next week.   7. F/U in 8 weeks- call if diclofenac not enough.   Please advise

## 2019-08-31 ENCOUNTER — Other Ambulatory Visit (HOSPITAL_COMMUNITY): Payer: Self-pay | Admitting: Cardiology

## 2019-09-03 ENCOUNTER — Telehealth: Payer: Self-pay | Admitting: *Deleted

## 2019-09-03 MED ORDER — TRAMADOL HCL 50 MG PO TABS: 100.0000 mg | ORAL_TABLET | Freq: Two times a day (BID) | ORAL | 5 refills | Status: DC

## 2019-09-03 MED ORDER — OXYCODONE HCL 5 MG PO TABS: 5.0000 mg | ORAL_TABLET | Freq: Four times a day (QID) | ORAL | 0 refills | Status: DC | PRN

## 2019-09-03 NOTE — Telephone Encounter (Signed)
Renewed meds

## 2019-09-03 NOTE — Telephone Encounter (Signed)
Refilled Oxycodone 5 mg QID prn for pain and tramadol 100 mg BID- #120 with 5 RFs.   Also renewed Diclofenac once-

## 2019-09-03 NOTE — Telephone Encounter (Signed)
Patient left a message asking for a refill on oxycodone and tramadol.  Patient is scheduled to see Dr. Dagoberto Ligas 09/23/2019

## 2019-09-03 NOTE — Telephone Encounter (Signed)
If it's chronic- oxycodone esp.  can cause patients to have messed up taste- can make things taste metallic, or less.  - if it's new, then he needs to be tested for COVID asap.

## 2019-09-03 NOTE — Telephone Encounter (Signed)
I contacted patient to inform his meds were sent to pharmacy and he asked a question.  He wants to know if Dr. Dagoberto Ligas would know if any of his medications or perhaps his conditions would cause his taste buds to not work.  I asked if he had any other symptoms. He said no.

## 2019-09-03 NOTE — Telephone Encounter (Signed)
Patient notified

## 2019-09-04 NOTE — Telephone Encounter (Signed)
Informed wife that Dr. Dagoberto Ligas recommends a Covid-19 test

## 2019-09-08 DIAGNOSIS — R748 Abnormal levels of other serum enzymes: Secondary | ICD-10-CM | POA: Diagnosis not present

## 2019-09-08 DIAGNOSIS — K760 Fatty (change of) liver, not elsewhere classified: Secondary | ICD-10-CM | POA: Diagnosis not present

## 2019-09-15 ENCOUNTER — Telehealth: Payer: Medicare Other

## 2019-09-18 ENCOUNTER — Ambulatory Visit: Payer: Medicare Other

## 2019-09-18 NOTE — Progress Notes (Signed)
Chronic Care Management Pharmacy  Name: Brandon [name]  MRN: [id number] DOB: 07/26/1940  Chief Complaint/ HPI  Brandon [name],  79 y.o. , male presents for their Follow-Up CCM visit with the clinical pharmacist via telephone due to COVID-19 Pandemic.  Work outside  Erratic taste buds have caused pt to eat less. Trying to maintain 3 meals/day, mirtazapine has helped some.   Saw pain mgmt yesterday, states he is no longer using oral diclofenac. Pain mgmt may consider increase in gabapentin dose moving forward, currently 900 mg/day.  PCP : Brunetta Jeans, PA-C. Their chronic conditions include: HTN, HLD, CAD, TIA, GERD, Chronic Pain Syndrome.   Office Visits: 04/20/19 (PCP): Allergies. Stop Flonase.  Start Nasacort.  Start saline nasal rinses.  Humidifier in bedroom.  Plain Mucinex twice daily.  Keep well-hydrated.  Rx Singulair.  Follow-up with ENT if symptoms not improving over the next 7 to 10 days.  Consider trying OTC voltaren for joint pain. 12/29/19 (PCP): Pain management. 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 Is currently on a regimen of Gabapentin 100 mg BID in addition to his 10 mg Oxycodone. Oxycodone reduced to 5 mg. Pain level in mouth near 0.   Consult Visit: 09/23/19 (Dr. Dagoberto Ligas, [place]): no changes 05/27/19 (Dr. Dagoberto Ligas, [facility]): Oxycodone 5 mg every 6 hours (previously q8H). Tramadol increased to 100mg  QID Waking up with cramping in back around 1-2 am. Pain 3, 4 avg. F/u 8 weeks or less if needed.   04/24/19 (Dr. Dagoberto Ligas, [facility]): Tramadol 100 mg BID added, Gabapentin increased to 300 mg TID. Voltaren gel on hands/fingers strongly suggested. Pain 5, 5 avg.   Medications: Outpatient Encounter Medications as of 09/24/2019  Medication Sig Note  . amLODipine (NORVASC) 10 MG tablet Take 1 tablet (10 mg total) by mouth daily.   Marland Kitchen aspirin 81 MG EC tablet Take 81 mg by mouth at bedtime.    Marland Kitchen atorvastatin (LIPITOR) 40 MG tablet Take 1 tablet (40 mg  total) by mouth daily.   . budesonide-formoterol (SYMBICORT) 160-4.5 MCG/ACT inhaler Inhale 2 puffs into the lungs as needed.   . gabapentin (NEURONTIN) 300 MG capsule Take 1 capsule (300 mg total) by mouth 3 (three) times daily.   . hydrocortisone cream 0.5 % Apply topically.   . isosorbide mononitrate (IMDUR) 30 MG 24 hr tablet Take 1 tablet (30 mg total) by mouth at bedtime.   Marland Kitchen lisinopril (ZESTRIL) 10 MG tablet TAKE 1 TABLET(10 MG) BY MOUTH DAILY   . metoprolol tartrate (LOPRESSOR) 25 MG tablet Take 0.5 tablets (12.5 mg total) by mouth 2 (two) times daily.   . mirtazapine (REMERON) 30 MG tablet Take 1 tablet (30 mg total) by mouth at bedtime. For appetite   . montelukast (SINGULAIR) 10 MG tablet TAKE 1 TABLET(10 MG) BY MOUTH AT BEDTIME   . omeprazole (PRILOSEC) 20 MG capsule TAKE 1 CAPSULE(20 MG) BY MOUTH DAILY   . oxyCODONE (OXY IR/ROXICODONE) 5 MG immediate release tablet Take 1 tablet (5 mg total) by mouth every 6 (six) hours as needed for severe pain.   . tamsulosin (FLOMAX) 0.4 MG CAPS capsule Take 0.4 mg by mouth daily.    . traMADol (ULTRAM) 50 MG tablet Take 2 tablets (100 mg total) by mouth 2 (two) times daily. 09/23/2019: Fill date 09/03/19 #120  today #85  last dose today  . triamcinolone (NASACORT) 55 MCG/ACT AERO nasal inhaler Place 2 sprays into the nose daily.   . diclofenac (VOLTAREN) 50 MG  EC tablet TAKE 1 TABLET(50 MG) BY MOUTH TWICE DAILY FOR 14 DAYS   . hydrOXYzine (VISTARIL) 25 MG capsule Take 25 mg by mouth 2 (two) times daily.   . [DISCONTINUED] gabapentin (NEURONTIN) 300 MG capsule Take 1 capsule (300 mg total) by mouth 3 (three) times daily.   . [DISCONTINUED] oxyCODONE (OXY IR/ROXICODONE) 5 MG immediate release tablet Take 1 tablet (5 mg total) by mouth every 6 (six) hours as needed for severe pain. 09/23/2019: Fill date 09/03/19 #120  today #45  last dose today   No facility-administered encounter medications on file as of 09/24/2019.   Current  Diagnosis/Assessment: Goals Addressed            This Visit's Progress   . PharmD Care Plan   On track    CARE PLAN ENTRY  Current Barriers:  . Chronic Disease Management support, education, and care coordination needs related to Hypertension, Hyperlipidemia, Gastroesophageal Reflux Disease, and pain/osteoarthritis.   Hypertension . Pharmacist Clinical Goal(s): o Over the next 180 days, patient will work with PharmD and providers to maintain BP goal <140/90 . Current regimen:   Amlodipine 10 mg daily   Lisinopril 10 mg daily in morning  Metoprolol tartrate 12.5 mg BID  Isosorbide mononitrate ER 30 mg daily . Interventions: o Monitoring plan - home monitoring at least once weekly  . Patient self care activities - Over the next 180 days, patient will: o Check BP at home once weekly, document, and provide at future appointments o Ensure daily salt intake < 2300 mg/day  Hyperlipidemia . Pharmacist Clinical Goal(s): o Over the next 180 days, patient will work with PharmD and providers to maintain LDL goal < 70 . Current regimen:  o Atorvastatin 40 mg daily  . Interventions: o Continue current management. . Patient self care activities - Over the next 180 days, patient will: o Continue current management.   Pain . Pharmacist Clinical Goal(s) o Over the next 180 days, patient will work with PharmD and providers to minimize recurring pain.  . Current regimen:   Oxycodone 5 mg every 6 hours as needed  Tramadol 100 mg every 6 hours as needed  Gabapentin 300 mg every 8 hours   Voltaren gel as needed . Interventions: o Continue current management. . Patient self care activities - Over the next 180 days, patient will: o Continue current management.  GERD . Pharmacist Clinical Goal(s) o Over the next 180 days, patient will work with PharmD and providers to minimize any recurring symptoms of acid reflux . Current regimen:  o Omeprazole 20 mg once daily   . Interventions: o No medication changes, continue avoidance of triggers including spicy foods  . Patient self care activities - Over the next 180 days, patient will: o Continue current management.    Medication management . Pharmacist Clinical Goal(s): o Over the next 180 days, patient will work with PharmD and providers to maintain optimal medication adherence . Current pharmacy: [facility] (local and mail order) . Interventions o Comprehensive medication review performed. o Continue current medication management strategy. . Patient self care activities - Over the next 180 days, patient will: o Focus on medication adherence by continuing current management o Take medications as prescribed o Report any questions or concerns to PharmD and/or provider(s) Initial goal documentation.      Hypertension   BP goal 140/90  BP Readings from Last 3 Encounters:  09/23/19 135/70  07/29/19 132/69  06/26/19 (!) 145/72   Patient is currently controlled on the  following medications:   Amlodipine 10 mg daily   Lisinopril 10 mg daily in morning  Metoprolol tartrate 12.5 mg twice daily  Isosorbide mononitrate ER 30 mg daily   Patient checks BP at home infrequently.  Denies dizziness, SOB, chest pain.  Plan  Continue current medications   Hyperlipidemia   LDL goal < 70    Component Value Date/Time   CHOL 132 01/22/2019 1057   TRIG 120 01/22/2019 1057   HDL 42 01/22/2019 1057   CHOLHDL 3.1 01/22/2019 1057   VLDL 24 01/22/2019 1057   LDLCALC 66 01/22/2019 1057   LDLDIRECT 81.2 05/25/2008 0906    Denies any muscle or abdominal pain or n/v. Also taking aspirin 81 mg for CVD prevention. Denies any abnormal bruising, bleeding from nose or gums or blood in urine or stool. Previous smoker for 20+ years. Patient is currently controlled on the following medications:   Atorvastatin 40 mg daily.   Plan  Continue current medications.   Pain management   Patient is currently  controlled on the following medications:   Oxycodone 5 mg four times daily  Tramadol 100 mg QID  Gabapentin 300 mg TID  Voltaren gel PRN  Plan  Continue current medications.  GERD   Currently controlled on omeprazole 20 mg daily.    Patient denies dysphagia, heartburn or nausea. 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   Reviewed trigger avoidance recommendations.  Plan   Continue current medication.  Vaccines   Immunization History  Administered Date(s) Administered  . Fluad Quad(high Dose 65+) 10/15/2018  . Influenza Split 11/26/2010, 10/11/2011, 11/03/2012  . Influenza Whole 11/14/2006, 12/04/2007, 10/06/2008, 10/25/2009  . Influenza, High Dose Seasonal PF 11/01/2016, 10/30/2017  . Influenza, Seasonal, Injecte, Preservative Fre 12/07/2014  . Influenza,inj,Quad PF,6+ Mos 11/04/2015  . Influenza-Unspecified 11/19/2013  . PFIZER SARS-COV-2 Vaccination 02/25/2019, 03/18/2019  . Pneumococcal Conjugate-13 12/07/2014  . Pneumococcal Polysaccharide-23 05/30/2017   Reviewed and discussed patient's vaccination history.  Remains due for the Shingles vaccine.  Plan  Recommend patient receive Shingrix vaccine in pharmacy.   Medication Management / Care Coordination    Food Insecurity: No Food Insecurity  . Worried About Charity fundraiser in the Last Year: Never true  . Ran Out of Food in the Last Year: Never true     Transportation Needs: No Transportation Needs  . Lack of Transportation (Medical): No  . Lack of Transportation (Non-Medical): No    Receives all medications from:  [facility] ORDER) [place], [place] [address] [place] [address] Phone: [phone] Fax: [phone]  [facility] [place], [place]  [address] Phone: [phone] Fax: [phone]  No issues with medication management at this time.   Plan  Continue current medication management strategy.    Follow up: 3 month phone visit.   Future Appointments  Date Time Provider [place]  10/28/2019  1:00 PM Harle Stanford., PA-C [facility] None  12/23/2019 10:40 AM Courtney Heys, MD [facility] CPR  03/25/2020 10:20 AM Eppie Gibson, MD [facility] None  _____________ Visit Information  SDOH (Social Determinants of Health) assessments performed: Yes.  Madelin Rear, Pharm.D., BCGP Clinical Pharmacist [facility] [phone]

## 2019-09-18 NOTE — Progress Notes (Signed)
error 

## 2019-09-23 ENCOUNTER — Other Ambulatory Visit: Payer: Self-pay

## 2019-09-23 ENCOUNTER — Encounter
Payer: Medicare Other | Attending: Physical Medicine and Rehabilitation | Admitting: Physical Medicine and Rehabilitation

## 2019-09-23 ENCOUNTER — Encounter: Payer: Self-pay | Admitting: Physical Medicine and Rehabilitation

## 2019-09-23 VITALS — BP 135/70 | HR 65 | Temp 98.3°F | Ht 68.0 in | Wt 169.8 lb

## 2019-09-23 DIAGNOSIS — G894 Chronic pain syndrome: Secondary | ICD-10-CM | POA: Diagnosis not present

## 2019-09-23 DIAGNOSIS — M5386 Other specified dorsopathies, lumbar region: Secondary | ICD-10-CM | POA: Diagnosis not present

## 2019-09-23 DIAGNOSIS — Z79891 Long term (current) use of opiate analgesic: Secondary | ICD-10-CM | POA: Diagnosis not present

## 2019-09-23 DIAGNOSIS — G8929 Other chronic pain: Secondary | ICD-10-CM | POA: Insufficient documentation

## 2019-09-23 DIAGNOSIS — Z5181 Encounter for therapeutic drug level monitoring: Secondary | ICD-10-CM | POA: Insufficient documentation

## 2019-09-23 DIAGNOSIS — G5703 Lesion of sciatic nerve, bilateral lower limbs: Secondary | ICD-10-CM | POA: Insufficient documentation

## 2019-09-23 DIAGNOSIS — M8949 Other hypertrophic osteoarthropathy, multiple sites: Secondary | ICD-10-CM | POA: Insufficient documentation

## 2019-09-23 MED ORDER — GABAPENTIN 300 MG PO CAPS: 300.0000 mg | ORAL_CAPSULE | Freq: Three times a day (TID) | ORAL | 5 refills | Status: DC

## 2019-09-23 MED ORDER — OXYCODONE HCL 5 MG PO TABS: 5.0000 mg | ORAL_TABLET | Freq: Four times a day (QID) | ORAL | 0 refills | Status: DC | PRN

## 2019-09-23 NOTE — Patient Instructions (Signed)
1. Sit on tennis ball- sit on something soft- like couch to do it- - in the area that bothers him so bad in buttocks- 10 minutes on -alternate each side. Daily x 30 days- then as needed.   2. Can also do stretch-stretch (pulling leg towards chest) As well as tennis ball.    3. Appetite- poor- Is sleeping well on Mirtazepine and no side effects- - so will stay with it for now- needs 3 weeks on it to know if it will help appetite.  If doesn't help in 3 weeks, will try Cyproheptadine next.    4. Refill Oxycodone 5 mg #120-  Due 8/28  5. Tramadol has refills on it. Doesn't need refill right now.   6. Focus on other things that makes him happy- likes building things-    7. Can increase gabapentin IF piriformis/tennis ball stretch doesn't help- for now, will refill Gabapentin at current dose.  Would increase over time to 600 mg 3x/day for nerve pain.   8. F/U in 3 months  9. Don't take Diclofenac every day- just bad days.

## 2019-09-23 NOTE — Progress Notes (Signed)
Subjective:    Patient ID: Brandon [name], male    DOB: 07-Dec-1940, 79 y.o.   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    Can't sit in touch- without cheeks of butt hurting.    The other day, couldn't lift arms >90 degrees.  Comes and goes.  Not bothering him today.   Everything else, is doing OK.    Gets better relief in arms, hip pain when takes Diclofenac-    Couldn't taste anything for 1 week- COVID (-)- now intermediate.  Other foods still don't taste right.  Appetite- is just gone.  Taken the Remeron/Mirtazepine-  Thought it might have caused taste bud issue- so taken last 5 days right now. Doesn't think it's helping appetite.   Had protein drink this AM Has to force self to eat something around 2pm and supper time- also Has lost 3-4 lbs but gained back when forced self to eat.  Eats part of food, then throws it away.  Likes sweets/salt- can taste them.  Even peach cobbler tasted weird/bland.    Liver enzymes a little elevated- needs U/S to look at liver since hx of liver abscess.       Pain Inventory Average Pain 5 Pain Right Now 5 My pain is constant and dull  In the last 24 hours, has pain interfered with the following? General activity 5 Relation with others 5 Enjoyment of life 5 What TIME of day is your pain at its worst? morning  Sleep (in general) Fair  Pain is worse with: walking, bending and sitting Pain improves with: medication Relief from Meds: 6  Family History  Problem Relation Age of Onset  . Heart disease Father 84       Living  . Coronary artery disease Father        CABG  . Alzheimer's disease Mother 23       Deceased  . Arthritis/Rheumatoid Mother    . Stomach cancer Maternal Uncle   . Brain cancer Maternal Aunt        x2  . Arthritis Brother        DJD  . Obesity Daughter        Had Bypass Sx   Social History   Socioeconomic History  . Marital status: Married    Spouse name: etta  . Number of children: 2  . Years of education: Not on file  . Highest education level: Not on file  Occupational History  . Occupation: retired    Fish farm manager: RETIRED    Comment: disabled due to back problems  Tobacco Use  . Smoking status: Former Smoker    Packs/day: 1.50    Years: 50.00    Pack years: 75.00    Types: Cigarettes    Quit date: 09/05/2017    Years since quitting: 2.0  . Smokeless tobacco: Never Used  . Tobacco comment: smoked less than 1 ppd for 40+ years; Using Vapor Cig  Vaping Use  . Vaping Use: Never used  Substance and Sexual Activity  . Alcohol use: Not Currently    Alcohol/week: 0.0 standard drinks    Comment:  07/19/2016 "might have a few drinks/year"  . Drug use: No  . Sexual activity: Never  Other Topics Concern  . Not on file  Social History Narrative   Married (3rd), [place]. 2 children from 1st marriage, 4 step children.    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       Cell # [phone]   Social Determinants of Health   Financial Resource Strain:   . Difficulty of Paying Living Expenses:   Food Insecurity: No Food Insecurity  . Worried About Charity fundraiser in the Last Year: Never true  . Ran Out of Food in the Last Year: Never true  Transportation Needs: No Transportation Needs  . Lack of Transportation (Medical): No  . Lack of Transportation (Non-Medical): No  Physical Activity:   . Days of Exercise per Week:   . Minutes of Exercise per Session:   Stress:   . Feeling of Stress :   Social Connections:   . Frequency of Communication with Friends and Family:   . Frequency of Social Gatherings with Friends and  Family:   . Attends Religious Services:   . Active Member of Clubs or Organizations:   . Attends Archivist Meetings:   Marland Kitchen Marital Status:    Past Surgical History:  Procedure Laterality Date  . BACK SURGERY    . CATARACT EXTRACTION W/ INTRAOCULAR LENS  IMPLANT, BILATERAL Bilateral 01/2012 - 02/2012  . DIRECT LARYNGOSCOPY Left 10/09/2017   Procedure: DIRECT LARYNGOSCOPY WITH BOPSY;  Surgeon: Jodi Marble, MD;  Location: [place];  Service: ENT;  Laterality: Left;  . ESOPHAGOGASTRODUODENOSCOPY (EGD) WITH ESOPHAGEAL DILATION     "couple times" (07/19/2016)  . ESOPHAGOSCOPY Left 10/09/2017   Procedure: ESOPHAGOSCOPY;  Surgeon: Jodi Marble, MD;  Location: [place];  Service: ENT;  Laterality: Left;  . IR GASTROSTOMY TUBE MOD SED  01/08/2018  . IR THORACENTESIS ASP PLEURAL SPACE W/IMG GUIDE  07/19/2016  . LAPAROSCOPIC CHOLECYSTECTOMY  1994  . LUMBAR DISC SURGERY  05/1996   L4-5; Dr. Sherwood Gambler   . LUMBAR LAMINECTOMY/DECOMPRESSION MICRODISCECTOMY  10/2002   L3-4. Dr. Sherwood Gambler  . MULTIPLE TOOTH EXTRACTIONS  1980s  . PARTIAL GLOSSECTOMY  12/02/2017   Dr. Nicolette Bang- [facility]  . pharyngoplasty for closure of tingue base defect  12/02/2017   Dr. Nicolette Bang- [facility]  . PICC LINE INSERTION  07/15/2016  . POSTERIOR LUMBAR FUSION  09/1996   Ray cage, L4-5 Dr. Rita Ohara  . PROSTATE BIOPSY  ~ 2017  . radical tonsillectomy Left 12/02/2017   [facility]ico[facility] at [facility]  . RIGID BRONCHOSCOPY Left 10/09/2017   Procedure: RIGID BRONCHOSCOPY;  Surgeon: Jodi Marble, MD;  Location: [place];  Service: ENT;  Laterality: Left;  . [facility]  [facility]02[facility]2017   [facility]   Past Surgical History:  Procedure Laterality Date  . BACK SURGERY    . CATARACT EXTRACTION W/ INTRAOCULAR LENS  IMPLANT, BILATERAL Bilateral 01/2012 - 02/2012  . DIRECT LARYNGOSCOPY Left 10/09/2017   Procedure: DIRECT LARYNGOSCOPY WITH BOPSY;  Surgeon: Jodi Marble, MD;  Location: [place];  Service: ENT;  Laterality: Left;  . ESOPHAGOGASTRODUODENOSCOPY (EGD) WITH ESOPHAGEAL DILATION     "couple times" (07/19/2016)  . ESOPHAGOSCOPY Left 10/09/2017   Procedure: ESOPHAGOSCOPY;  Surgeon: Jodi Marble, MD;  Location: [place];  Service: ENT;  Laterality: Left;  . IR GASTROSTOMY TUBE  MOD SED  01/08/2018  . IR THORACENTESIS ASP PLEURAL SPACE W/IMG GUIDE  07/19/2016  . LAPAROSCOPIC CHOLECYSTECTOMY  1994  . LUMBAR DISC SURGERY  05/1996   L4-5; Dr. Sherwood Gambler   . LUMBAR LAMINECTOMY/DECOMPRESSION MICRODISCECTOMY  10/2002   L3-4. Dr. Sherwood Gambler  . MULTIPLE TOOTH EXTRACTIONS  1980s  . PARTIAL GLOSSECTOMY  12/02/2017   Dr. Nicolette Bang- [facility]  . pharyngoplasty for closure of tingue base defect  12/02/2017   [facility]ico[facility]  . PICC LINE INSERTION  07/15/2016  . POSTERIOR LUMBAR FUSION  09/1996   Ray cage, L4-5 Dr. Rita Ohara  . PROSTATE BIOPSY  ~ 2017  . radical tonsillectomy Left 12/02/2017   Dr. Nicolette [facility] at [facility]  . RIGID BRONCHOSCOPY Left 10/09/2017   Procedure: RIGID BRONCHOSCOPY;  Surgeon: Jodi Marble, MD;  Location: [place];  Service: ENT;  Laterality: Left;  . TR[facility]OST[facility]  [facility]02[facility]2017   [facility]   Past Medical History:  Diagnosis Date  . Aortic stenosis    mild echocardiogram 8/09 EF 60%, showed no regional wall motion abnml, mild LVH, mod focal basal septal hypertrophy and mild dyastolic dysfunction. 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 mean gradient across aortic valve was 8 mmHG. also mild L atrial enlargement and normal RV size and function. minimal AS on LHC in 7/10.   . Arthritis    "all over" (07/19/2016)  . BPH (benign prostatic hypertrophy)   . CAD (coronary artery disease)    LCH (7/10) totally occluded proximal RCA with very robuse L to R collaterals, 50% proximal LAD stenosis, EF 65%, medical management.    . Chicken pox   . Chronic lower back pain    s/p  surgical fusion  . Diverticulosis   . Esophageal stricture   . GERD (gastroesophageal reflux disease)   . Heart murmur   . Hepatitis B 1984  . Hiatal hernia   . History of radiation therapy 01/16/18- 03/05/18   Left Tonsil, 66 Gy in 33 fractions to high risk nodal echelons.   Marland Kitchen HLD (hyperlipidemia)   . HTN (hypertension)   . 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 Osteoarthritis   . TIA (transient ischemic attack) 1990s   hx  . tonsillar ca dx'd 11/2017  . Tubular adenoma of colon 2009   BP 135/70   Pulse 65   Temp 98.3 F (36.8 C)   Ht 5\' 8"  (1.727 m)   Wt 169 lb 12.8 oz (77 kg)   SpO2 95%   BMI 25.82 kg/m   Opioid Risk Score:   Fall Risk Score:  `1  Depression screen PHQ 2/9  Depression screen [facility] 2/9 09/23/2019 07/29/2019 05/27/2019 04/24/2019 04/20/2019 09/12/2018 05/23/2018  Decreased Interest 0 1 1 1 1 1  0  Down, Depressed, Hopeless 0 0 0 0 1 1 1   PHQ - 2 Score 0 1 1 1 2 2 1   Altered sleeping - - - 1 0 0 1  Tired, decreased energy - - - 1 0 0 0  Change in appetite - - - 0 0 0 1  Feeling bad or failure about yourself  - - - 1 0 0 1  Trouble concentrating - - - 0 0 0 0  Moving slowly or fidgety/restless - - - 0 0 0 0  Suicidal thoughts - - - 0 0 0 0  PHQ-9 Score - - - 4 2 2 4   Difficult doing work/chores - - - - - Somewhat difficult Somewhat  difficult  Some recent data might be hidden   Review of Systems  Constitutional: Positive for appetite change.       Loss of taste and smell---covid 19 negative  HENT: Negative.   Eyes: Negative.   Respiratory: Negative.   Cardiovascular: Negative.   Gastrointestinal: Negative.   Endocrine: Negative.   Genitourinary: Negative.   Musculoskeletal: Positive for arthralgias and back pain.       Sciatic pain  Skin: Negative.   Allergic/Immunologic: Negative.   Neurological: Positive for numbness.       Burning sensation  Hematological: Negative.   Psychiatric/Behavioral: Negative.   All other systems reviewed and are negative.        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 Has piriformis syndrome B/L   1. Sit on tennis ball- sit on something soft- like couch to do it- - in the area that bothers him so bad in buttocks- 10 minutes on -alternate each side. Daily x 30 days- then as needed.   2. Can also do stretch-stretch (pulling leg towards chest) As well as tennis ball.    3. Appetite- poor- Is sleeping well on Mirtazepine and no side effects- - so will stay with it for now- needs 3 weeks on it to know if it will help appetite.  If doesn't help in 3 weeks, will try Cyproheptadine next.    4. Refill Oxycodone 5 mg #120-  Due 8/28  5. Tramadol has refills on it. Doesn't need refill right now.   6. Focus on other things that makes him happy- likes building things-    7. Can increase gabapentin IF piriformis/tennis ball stretch doesn't help- for now, will refill Gabapentin at current dose.  Would increase over time to 600 mg 3x/day for nerve pain.   8. F/U in 3 months   I spent a total of 30 minutes on visit- as detailed above.

## 2019-09-24 ENCOUNTER — Ambulatory Visit: Payer: Medicare Other

## 2019-09-24 DIAGNOSIS — I10 Essential (primary) hypertension: Secondary | ICD-10-CM

## 2019-09-24 DIAGNOSIS — E7849 Other hyperlipidemia: Secondary | ICD-10-CM

## 2019-09-24 NOTE — Patient Instructions (Addendum)
Please review care plan below and call me at [phone] (direct line) with any questions!  Thank you, Edyth Gunnels., Clinical Pharmacist  Goals Addressed            This Visit's Progress   . PharmD Care Plan   On track    CARE PLAN ENTRY  Current Barriers:  . Chronic Disease Management support, education, and care coordination needs related to Hypertension, Hyperlipidemia, Gastroesophageal Reflux Disease, and pain/osteoarthritis.   Hypertension . Pharmacist Clinical Goal(s): o Over the next 180 days, patient will work with PharmD and providers to maintain BP goal <140/90 . Current regimen:   Amlodipine 10 mg daily   Lisinopril 10 mg daily in morning  Metoprolol tartrate 12.5 mg BID  Isosorbide mononitrate ER 30 mg daily . Interventions: o Monitoring plan - home monitoring at least once weekly  . Patient self care activities - Over the next 180 days, patient will: o Check BP at home once weekly, document, and provide at future appointments o Ensure daily salt intake < 2300 mg/day  Hyperlipidemia . Pharmacist Clinical Goal(s): o Over the next 180 days, patient will work with PharmD and providers to maintain LDL goal < 70 . Current regimen:  o Atorvastatin 40 mg daily  . Interventions: o Continue current management. . Patient self care activities - Over the next 180 days, patient will: o Continue current management.   Pain . Pharmacist Clinical Goal(s) o Over the next 180 days, patient will work with PharmD and providers to minimize recurring pain.  . Current regimen:   Oxycodone 5 mg every 6 hours as needed  Tramadol 100 mg every 6 hours as needed  Gabapentin 300 mg every 8 hours   Voltaren gel as needed . Interventions: o Continue current management. . Patient self care activities - Over the next 180 days, patient will: o Continue current management.  GERD . Pharmacist Clinical Goal(s) o Over the next 180 days, patient will work with PharmD and providers to  minimize any recurring symptoms of acid reflux . Current regimen:  o Omeprazole 20 mg once daily  . Interventions: o No medication changes, continue avoidance of triggers including spicy foods  . Patient self care activities - Over the next 180 days, patient will: o Continue current management.    Medication management . Pharmacist Clinical Goal(s): o Over the next 180 days, patient will work with PharmD and providers to maintain optimal medication adherence . Current pharmacy: [facility] (local and mail order) . Interventions o Comprehensive medication review performed. o Continue current medication management strategy. . Patient self care activities - Over the next 180 days, patient will: o Focus on medication adherence by continuing current management o Take medications as prescribed o Report any questions or concerns to PharmD and/or provider(s) Initial goal documentation.       Telephone follow up appointment with pharmacy team member scheduled for: See next appointment with "Care Management Staff" under "What's Next" below.   Madelin Rear, Pharm.D., BCGP Clinical Pharmacist [facility] [phone]  Hypertension, Adult High blood pressure (hypertension) is when the force of blood pumping through the arteries is too strong. The arteries are the blood vessels that carry blood from the heart throughout the body. Hypertension forces the heart to work harder to pump blood and may cause arteries to become narrow or stiff. Untreated or uncontrolled hypertension can cause a heart attack, heart failure, a stroke, kidney disease, and other problems. A blood pressure reading consists of a higher number  over a lower number. Ideally, your blood pressure should be below 120/80. The first ("top") number is called the systolic pressure. It is a measure of the pressure in your arteries as your heart beats. The second ("bottom") number is called the diastolic pressure. It is a measure of  the pressure in your arteries as the heart relaxes. What are the causes? The exact cause of this condition is not known. There are some conditions that result in or are related to high blood pressure. What increases the risk? Some risk factors for high blood pressure are under your control. The following factors may make you more likely to develop this condition:  Smoking.  Having type 2 diabetes mellitus, high cholesterol, or both.  Not getting enough exercise or physical activity.  Being overweight.  Having too much fat, sugar, calories, or salt (sodium) in your diet.  Drinking too much alcohol. Some risk factors for high blood pressure may be difficult or impossible to change. Some of these factors include:  Having chronic kidney disease.  Having a family history of high blood pressure.  Age. Risk increases with age.  Race. You may be at higher risk if you are African American.  Gender. Men are at higher risk than women before age 55. After age 77, women are at higher risk than men.  Having obstructive sleep apnea.  Stress. What are the signs or symptoms? High blood pressure may not cause symptoms. Very high blood pressure (hypertensive crisis) may cause:  Headache.  Anxiety.  Shortness of breath.  Nosebleed.  Nausea and vomiting.  Vision changes.  Severe chest pain.  Seizures. How is this diagnosed? This condition is diagnosed by measuring your blood pressure while you are seated, with your arm resting on a flat surface, your legs uncrossed, and your feet flat on the floor. The cuff of the blood pressure monitor will be placed directly against the skin of your upper arm at the level of your heart. It should be measured at least twice using the same arm. Certain conditions can cause a difference in blood pressure between your right and left arms. Certain factors can cause blood pressure readings to be lower or higher than normal for a short period of  time:  When your blood pressure is higher when you are in a health care provider's office than when you are at home, this is called white coat hypertension. Most people with this condition do not need medicines.  When your blood pressure is higher at home than when you are in a health care provider's office, this is called masked hypertension. Most people with this condition may need medicines to control blood pressure. If you have a high blood pressure reading during one visit or you have normal blood pressure with other risk factors, you may be asked to:  Return on a different day to have your blood pressure checked again.  Monitor your blood pressure at home for 1 week or longer. If you are diagnosed with hypertension, you may have other blood or imaging tests to help your health care provider understand your overall risk for other conditions. How is this treated? This condition is treated by making healthy lifestyle changes, such as eating healthy foods, exercising more, and reducing your alcohol intake. Your health care provider may prescribe medicine if lifestyle changes are not enough to get your blood pressure under control, and if:  Your systolic blood pressure is above 130.  Your diastolic blood pressure is above 80. Your  personal target blood pressure may vary depending on your medical conditions, your age, and other factors. Follow these instructions at home: Eating and drinking   Eat a diet that is high in fiber and potassium, and low in sodium, added sugar, and fat. An example eating plan is called the DASH (Dietary Approaches to Stop Hypertension) diet. To eat this way: ? Eat plenty of fresh fruits and vegetables. Try to fill one half of your plate at each meal with fruits and vegetables. ? Eat whole grains, such as whole-wheat pasta, brown rice, or whole-grain bread. Fill about one fourth of your plate with whole grains. ? Eat or drink low-fat dairy products, such as skim  milk or low-fat yogurt. ? Avoid fatty cuts of meat, processed or cured meats, and poultry with skin. Fill about one fourth of your plate with lean proteins, such as fish, chicken without skin, beans, eggs, or tofu. ? Avoid pre-made and processed foods. These tend to be higher in sodium, added sugar, and fat.  Reduce your daily sodium intake. Most people with hypertension should eat less than 1,500 mg of sodium a day.  Do not drink alcohol if: ? Your health care provider tells you not to drink. ? You are pregnant, may be pregnant, or are planning to become pregnant.  If you drink alcohol: ? Limit how much you use to:  0-1 drink a day for women.  0-2 drinks a day for men. ? Be aware of how much alcohol is in your drink. In the [place] one drink equals one 12 oz bottle of beer (355 mL), one 5 oz glass of wine (148 mL), or one 1 oz glass of hard liquor (44 mL). Lifestyle   Work with your health care provider to maintain a healthy body weight or to lose weight. Ask what an ideal weight is for you.  Get at least 30 minutes of exercise most days of the week. Activities may include walking, swimming, or biking.  Include exercise to strengthen your muscles (resistance exercise), such as Pilates or lifting weights, as part of your weekly exercise routine. Try to do these types of exercises for 30 minutes at least 3 days a week.  Do not use any products that contain nicotine or tobacco, such as cigarettes, e-cigarettes, and chewing tobacco. If you need help quitting, ask your health care provider.  Monitor your blood pressure at home as told by your health care provider.  Keep all follow-up visits as told by your health care provider. This is important. Medicines  Take over-the-counter and prescription medicines only as told by your health care provider. Follow directions carefully. Blood pressure medicines must be taken as prescribed.  Do not skip doses of blood pressure medicine. Doing this  puts you at risk for problems and can make the medicine less effective.  Ask your health care provider about side effects or reactions to medicines that you should watch for. Contact a health care provider if you:  Think you are having a reaction to a medicine you are taking.  Have headaches that keep coming back (recurring).  Feel dizzy.  Have swelling in your ankles.  Have trouble with your vision. Get help right away if you:  Develop a severe headache or confusion.  Have unusual weakness or numbness.  Feel faint.  Have severe pain in your chest or abdomen.  Vomit repeatedly.  Have trouble breathing. Summary  Hypertension is when the force of blood pumping through your arteries is too strong.  If this condition is not controlled, it may put you at risk for serious complications.  Your personal target blood pressure may vary depending on your medical conditions, your age, and other factors. For most people, a normal blood pressure is less than 120/80.  Hypertension is treated with lifestyle changes, medicines, or a combination of both. Lifestyle changes include losing weight, eating a healthy, low-sodium diet, exercising more, and limiting alcohol. This information is not intended to replace advice given to you by your health care provider. Make sure you discuss any questions you have with your health care provider. Document Revised: 10/02/2017 Document Reviewed: 10/02/2017 Elsevier Patient Education  2020 [employer].

## 2019-09-27 LAB — DRUG TOX MONITOR 1 W/CONF, ORAL FLD
Amphetamines: NEGATIVE ng/mL (ref <10)
Barbiturates: NEGATIVE ng/mL (ref <10)
Benzodiazepines: NEGATIVE ng/mL (ref <0.50)
Buprenorphine: NEGATIVE ng/mL (ref <0.10)
Cocaine: NEGATIVE ng/mL (ref <5.0)
Codeine: NEGATIVE ng/mL (ref <2.5)
Cotinine: 20 ng/mL — ABNORMAL HIGH (ref <5.0)
Dihydrocodeine: NEGATIVE ng/mL (ref <2.5)
Fentanyl: NEGATIVE ng/mL (ref <0.10)
Heroin Metabolite: NEGATIVE ng/mL (ref <1.0)
Hydrocodone: NEGATIVE ng/mL (ref <2.5)
Hydromorphone: NEGATIVE ng/mL (ref <2.5)
MARIJUANA: NEGATIVE ng/mL (ref <2.5)
MDMA: NEGATIVE ng/mL (ref <10)
Meprobamate: NEGATIVE ng/mL (ref <2.5)
Methadone: NEGATIVE ng/mL (ref <5.0)
Morphine: NEGATIVE ng/mL (ref <2.5)
Nicotine Metabolite: POSITIVE ng/mL — AB (ref <5.0)
Norhydrocodone: NEGATIVE ng/mL (ref <2.5)
Noroxycodone: 4 ng/mL — ABNORMAL HIGH (ref <2.5)
Opiates: POSITIVE ng/mL — AB (ref <2.5)
Oxycodone: 56.6 ng/mL — ABNORMAL HIGH (ref <2.5)
Oxymorphone: NEGATIVE ng/mL (ref <2.5)
Phencyclidine: NEGATIVE ng/mL (ref <10)
Tapentadol: NEGATIVE ng/mL (ref <5.0)
Tramadol: 438.1 ng/mL — ABNORMAL HIGH (ref <5.0)
Tramadol: POSITIVE ng/mL — AB (ref <5.0)
Zolpidem: NEGATIVE ng/mL (ref <5.0)

## 2019-09-27 LAB — DRUG TOX ALC METAB W/CON, ORAL FLD: Alcohol Metabolite: NEGATIVE ng/mL (ref <25)

## 2019-09-28 ENCOUNTER — Telehealth: Payer: Self-pay | Admitting: Physician Assistant

## 2019-09-28 ENCOUNTER — Telehealth: Payer: Self-pay | Admitting: *Deleted

## 2019-09-28 MED ORDER — GABAPENTIN 600 MG PO TABS: 600.0000 mg | ORAL_TABLET | Freq: Three times a day (TID) | ORAL | 5 refills | Status: DC

## 2019-09-28 NOTE — Telephone Encounter (Addendum)
Mr Rousseau got up (or tried to get up ) Saturday morning --his wife had to help him get to the bathroom because he was in so much pain.  He was hurting all over. He describes it like when he would go work out in years past and do a hard work out and the next day he would be so extremely sore it was hard to move. This pain is very similar.  He was hurting so bad that he called EMS. They came out and checked him but would not take him to the ED due to the extended waits related to the covid 19 situation.  His bp was elevated because of the pain and he said that he even cried it hurt so bad.  He did take two of his oxy at once and it only helped a little.  Today he is feeling a little better.  He is asking for a call back from [name] about this situation.  [phone].

## 2019-09-28 NOTE — Telephone Encounter (Signed)
Pt called in stating that he was told by Dr. Dagoberto Ligas that he needs to have BW done asap. He wanted to know if he can come here to have the bw done. He needs:  Basic Metabolic panel, CBC with diff, TSH, and Vit D level if not lately, and U/A and Cx.  Suggest PCP do- just to make sure no infection, lab abnormalities causing Sx's. Suggest do ASAP   Please advise if it is ok to schedule a nurse visit

## 2019-09-28 NOTE — Telephone Encounter (Signed)
Pt is scheduled for an appt

## 2019-09-28 NOTE — Telephone Encounter (Signed)
Hurting -real- bad. Sounds like in a flare of pain.   Doubled up on Oxycodone that day.  Took Tramadol as prescribed.   Then 3 hrs later, took another Oxycodone-  Helped a little bit.   Aching pain. Describes pain as all aching- can't even touch him, pain so bad.   When got up this AM, still hurting- not as bad.  Feels like is improving.   Still cannot use R arm.  No trauma.  All over the arm- not 1 specific place.   Tennis ball- helped short term.  But then pain came back.   Has been taking 2 tylenol- thinks might have, because is improving.   Has to stand there for a few minutes before muscle relaxing enough so can walk.  Doesn't have fever/temperature- no chills, no joints are red, like gout.   Within 30 minutes after bath, pain was back.  Feels like has worked out too bad- hurts to make movement.   L hand is better than R hand/shoulder    Plan: 1. Basic Metabolic panel, CBC with diff, TSH, and Vit D level if not lately, and U/A and Cx.  Suggest PCP do- just to make sure no infection, lab abnormalities causing Sx's. Suggest do ASAP.    2. Increase gabapentin to 600 mg 2x/day x 1 week then 600 mg 3x/day- for nerve pain.   3. Suggest can take a few extra oxycodone for now- call me if needs to pick up early.   4. Call PCP to get him let know what's occurring.

## 2019-09-28 NOTE — Telephone Encounter (Signed)
No he would need appointment to discuss symptoms that she feels warrant assessment or he would need to have orders from her placed that we can draw here in office

## 2019-09-29 ENCOUNTER — Other Ambulatory Visit: Payer: Self-pay

## 2019-09-29 ENCOUNTER — Encounter: Payer: Self-pay | Admitting: Physician Assistant

## 2019-09-29 ENCOUNTER — Ambulatory Visit (INDEPENDENT_AMBULATORY_CARE_PROVIDER_SITE_OTHER): Payer: Medicare Other | Admitting: Physician Assistant

## 2019-09-29 VITALS — BP 102/60 | HR 98 | Temp 97.5°F | Resp 16 | Ht 68.0 in | Wt 165.0 lb

## 2019-09-29 DIAGNOSIS — M791 Myalgia, unspecified site: Secondary | ICD-10-CM

## 2019-09-29 LAB — CBC WITH DIFFERENTIAL/PLATELET
Basophils Absolute: 0 10*3/uL (ref 0.0–0.1)
Basophils Relative: 0.7 % (ref 0.0–3.0)
Eosinophils Absolute: 0.4 10*3/uL (ref 0.0–0.7)
Eosinophils Relative: 5.1 % — ABNORMAL HIGH (ref 0.0–5.0)
HCT: 35.3 % — ABNORMAL LOW (ref 39.0–52.0)
Hemoglobin: 11.8 g/dL — ABNORMAL LOW (ref 13.0–17.0)
Lymphocytes Relative: 6.9 % — ABNORMAL LOW (ref 12.0–46.0)
Lymphs Abs: 0.5 10*3/uL — ABNORMAL LOW (ref 0.7–4.0)
MCHC: 33.4 g/dL (ref 30.0–36.0)
MCV: 79.7 fl (ref 78.0–100.0)
Monocytes Absolute: 0.4 10*3/uL (ref 0.1–1.0)
Monocytes Relative: 4.9 % (ref 3.0–12.0)
Neutro Abs: 6 10*3/uL (ref 1.4–7.7)
Neutrophils Relative %: 82.4 % — ABNORMAL HIGH (ref 43.0–77.0)
Platelets: 194 10*3/uL (ref 150.0–400.0)
RBC: 4.43 Mil/uL (ref 4.22–5.81)
RDW: 15.5 % (ref 11.5–15.5)
WBC: 7.3 10*3/uL (ref 4.0–10.5)

## 2019-09-29 LAB — COMPREHENSIVE METABOLIC PANEL
ALT: 8 U/L (ref 0–53)
AST: 10 U/L (ref 0–37)
Albumin: 3.7 g/dL (ref 3.5–5.2)
Alkaline Phosphatase: 180 U/L — ABNORMAL HIGH (ref 39–117)
BUN: 23 mg/dL (ref 6–23)
CO2: 30 mEq/L (ref 19–32)
Calcium: 8.7 mg/dL (ref 8.4–10.5)
Chloride: 99 mEq/L (ref 96–112)
Creatinine, Ser: 0.78 mg/dL (ref 0.40–1.50)
GFR: 96.52 mL/min (ref >60.00)
Glucose, Bld: 122 mg/dL — ABNORMAL HIGH (ref 70–99)
Potassium: 3.7 mEq/L (ref 3.5–5.1)
Sodium: 137 mEq/L (ref 135–145)
Total Bilirubin: 0.5 mg/dL (ref 0.2–1.2)
Total Protein: 6.8 g/dL (ref 6.0–8.3)

## 2019-09-29 LAB — URINALYSIS, ROUTINE W REFLEX MICROSCOPIC
Bilirubin Urine: NEGATIVE
Hgb urine dipstick: NEGATIVE
Ketones, ur: NEGATIVE
Leukocytes,Ua: NEGATIVE
Nitrite: NEGATIVE
RBC / HPF: NONE SEEN (ref 0–?)
Specific Gravity, Urine: 1.01 (ref 1.000–1.030)
Total Protein, Urine: NEGATIVE
Urine Glucose: NEGATIVE
Urobilinogen, UA: 0.2 (ref 0.0–1.0)
pH: 6 (ref 5.0–8.0)

## 2019-09-29 LAB — SEDIMENTATION RATE: Sed Rate: 55 mm/hr — ABNORMAL HIGH (ref 0–20)

## 2019-09-29 LAB — HIGH SENSITIVITY CRP: CRP, High Sensitivity: 95.44 mg/L — ABNORMAL HIGH (ref 0.000–5.000)

## 2019-09-29 LAB — TSH: TSH: 1.84 u[IU]/mL (ref 0.35–4.50)

## 2019-09-29 NOTE — Patient Instructions (Signed)
Please continue the care directed by Dr. Dagoberto Ligas. I feel symptoms are postviral from a recent covid infection, mentioning symptoms you had 3 weeks ago and ongoing loss of taste. I am making sure everything else looks good.   Avoid heavy lifting and overexertion. Rest and keep well-hydrate.  Heating pad to lower back for 10-15 minutes a few times per day.

## 2019-09-29 NOTE — Progress Notes (Signed)
Patient presents to clinic today c/o myalgias over the past 3 weeks that seem to wax and wane in severity. Notes 3 weeks ago having abrupt onset of aches, fevers, increased nasal congestion and loss of taste. Was tested for COVID but this was negative so he did not think he had COVID. Notes he hydrated, rested and took OTC medications for symptoms. Denies ever having fever, chills, SOB, chest congestion or cough. Noted increase in sinus congestion only for a day, then returned to his baseline giving history of chronic rhinitis and ENT surgeries. Noted aches improved overall but have still fluctuated in intensity. Denies any swelling, redness or warmth of joints. States Saturday he had significant soreness and felt like he had worked out the day before when he had been resting. Was taking chronic pain medication prescribed by his specialist Dr. Dagoberto Ligas. Notes he did reach out to them yesterday regarding symptoms at which time his dose of Gabapentin was increased and he was encouraged to see his PCP for further evaluation to make sure nothing more serious present. Today he feels much better than previous days.   Past Medical History:  Diagnosis Date  . Aortic stenosis    mild echocardiogram 8/09 EF 60%, showed no regional wall motion abnml, mild LVH, mod focal basal septal hypertrophy and mild dyastolic dysfunction. 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 mean gradient across aortic valve was 8 mmHG. also mild L atrial enlargement and normal RV size and function. minimal AS on LHC in 7/10.   . Arthritis    "all over" (07/19/2016)  . BPH (benign prostatic hypertrophy)   . CAD (coronary artery disease)    [facility] (7/10) totally occluded proximal RCA with very robuse L to R collaterals, 50% proximal LAD stenosis, EF 65%, medical management.    . Chicken pox   . Chronic lower back pain    s/p surgical fusion  . Diverticulosis   . Esophageal stricture   . GERD  (gastroesophageal reflux disease)   . Heart murmur   . Hepatitis B 1984  . Hiatal hernia   . History of radiation therapy 01/16/18- 03/05/18   Left Tonsil, 66 Gy in 33 fractions to high risk nodal echelons.   Marland Kitchen HLD (hyperlipidemia)   . HTN (hypertension)   . 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 Osteoarthritis   . TIA (transient ischemic attack) 1990s   hx  . tonsillar ca dx'd 11/2017  . Tubular adenoma of colon 2009    Current Outpatient Medications on File Prior to Visit  Medication Sig Dispense Refill  . amLODipine (NORVASC) 10 MG tablet Take 1 tablet (10 mg total) by mouth daily. 90 tablet 3  . aspirin 81 MG EC tablet Take 81 mg by mouth at bedtime.     Marland Kitchen atorvastatin (LIPITOR) 40 MG tablet Take 1 tablet (40 mg total) by mouth daily. 90 tablet 3  . budesonide-formoterol (SYMBICORT) 160-4.5 MCG/ACT inhaler Inhale 2 puffs into the lungs as needed.    . diclofenac (VOLTAREN) 50 MG EC tablet TAKE 1 TABLET(50 MG) BY MOUTH TWICE DAILY FOR 14 DAYS 28 tablet 0  . gabapentin (NEURONTIN) 600 MG tablet Take 1 tablet (600 mg total) by mouth 3 (three) times daily. Start with 600 mg BID x 1 week then 600 mg 3x/day- for nerve pain 180 tablet 5  . hydrocortisone cream 0.5 % Apply topically.    . hydrOXYzine (VISTARIL) 25 MG capsule Take 25 mg by mouth 2 (  two) times daily.    . isosorbide mononitrate (IMDUR) 30 MG 24 hr tablet Take 1 tablet (30 mg total) by mouth at bedtime. 90 tablet 1  . lisinopril (ZESTRIL) 10 MG tablet TAKE 1 TABLET(10 MG) BY MOUTH DAILY 90 tablet 3  . metoprolol tartrate (LOPRESSOR) 25 MG tablet Take 0.5 tablets (12.5 mg total) by mouth 2 (two) times daily. 30 tablet 5  . mirtazapine (REMERON) 30 MG tablet Take 1 tablet (30 mg total) by mouth at bedtime. For appetite 30 tablet 3  . montelukast (SINGULAIR) 10 MG tablet TAKE 1 TABLET(10 MG) BY MOUTH AT BEDTIME 30 tablet 3  . omeprazole (PRILOSEC) 20 MG capsule TAKE 1 CAPSULE(20 MG) BY MOUTH DAILY 90 capsule 1  . oxyCODONE (OXY  IR/ROXICODONE) 5 MG immediate release tablet Take 1 tablet (5 mg total) by mouth every 6 (six) hours as needed for severe pain. 120 tablet 0  . tamsulosin (FLOMAX) 0.4 MG CAPS capsule Take 0.4 mg by mouth daily.     . traMADol (ULTRAM) 50 MG tablet Take 2 tablets (100 mg total) by mouth 2 (two) times daily. 120 tablet 5  . triamcinolone (NASACORT) 55 MCG/ACT AERO nasal inhaler Place 2 sprays into the nose daily. 1 Inhaler 1   No current facility-administered medications on file prior to visit.    Allergies  Allergen Reactions  . Celebrex [Celecoxib] Hives and Itching    Family History  Problem Relation Age of Onset  . Heart disease Father 30       Living  . Coronary artery disease Father        CABG  . Alzheimer's disease Mother 78       Deceased  . Arthritis/Rheumatoid Mother   . Stomach cancer Maternal Uncle   . Brain cancer Maternal Aunt        x2  . Arthritis Brother        DJD  . Obesity Daughter        Had Bypass Sx    Social History   Socioeconomic History  . Marital status: Married    Spouse name: etta  . Number of children: 2  . Years of education: Not on file  . Highest education level: Not on file  Occupational History  . Occupation: retired    Fish farm manager: RETIRED    Comment: disabled due to back problems  Tobacco Use  . Smoking status: Former Smoker    Packs/day: 1.50    Years: 50.00    Pack years: 75.00    Types: Cigarettes    Quit date: 09/05/2017    Years since quitting: 2.0  . Smokeless tobacco: Never Used  . Tobacco comment: smoked less than 1 ppd for 40+ years; Using Vapor Cig  Vaping Use  . Vaping Use: Never used  Substance and Sexual Activity  . Alcohol use: Not Currently    Alcohol/week: 0.0 standard drinks    Comment: 07/19/2016 "might have a few drinks/year"  . Drug use: No  . Sexual activity: Never  Other Topics Concern  . Not on file  Social History Narrative   Married (3rd), [place]. 2 children from 1st marriage, 4 step children.     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       Cell # [phone]   Social Determinants of Health   Financial Resource Strain:   . Difficulty of Paying Living Expenses: Not on file  Food Insecurity: No Food Insecurity  . Worried About Charity fundraiser in the Last Year: Never true  . Ran Out of Food in the Last Year: Never true  Transportation Needs: No Transportation Needs  . Lack of Transportation (Medical): No  . Lack of Transportation (Non-Medical): No  Physical Activity:   . Days of Exercise per Week: Not on file  . Minutes of Exercise per Session: Not on file  Stress:   . Feeling of Stress : Not on file  Social Connections:   . Frequency of Communication with Friends and Family: Not on file  . Frequency of Social Gatherings with Friends and Family: Not on file  . Attends Religious Services: Not on file  . Active Member of Clubs or Organizations: Not on file  . Attends Archivist Meetings: Not on file  . Marital Status: Not on file   Review of Systems - See HPI.  All other ROS are negative.  Resp 16   Ht 5\' 8"  (1.727 m)   Wt 165 lb (74.8 kg)   BMI 25.09 kg/m   Physical Exam Vitals reviewed.  Constitutional:      Appearance: Normal appearance.  HENT:     Head: Normocephalic and atraumatic.     Right Ear: Tympanic membrane normal.     Left Ear: Tympanic membrane normal.  Eyes:     Conjunctiva/sclera: Conjunctivae normal.     Pupils: Pupils are equal, round, and reactive to light.  Cardiovascular:     Rate and Rhythm: Normal rate and regular rhythm.     Pulses: Normal pulses.     Heart sounds: Normal heart sounds.  Pulmonary:     Effort: Pulmonary effort is normal.     Breath sounds: Normal breath sounds.  Musculoskeletal:        General: Tenderness (slight tenderness of proximal musculature of arms) present. No swelling. Normal range of motion.     Cervical back:  Neck supple.     Right lower leg: No edema.     Left lower leg: No edema.  Skin:    Coloration: Skin is not jaundiced.     Findings: No erythema.  Neurological:     General: No focal deficit present.     Mental Status: He is alert and oriented to person, place, and time.  Psychiatric:        Mood and Affect: Mood normal.     Recent Results (from the past 2160 hour(s))  Drug Tox Monitor 1 w/Conf, Oral Fld     Status: Abnormal   Collection Time: 09/23/19 10:36 AM  Result Value Ref Range   Amphetamines NEGATIVE <10 ng/mL   Barbiturates NEGATIVE <10 ng/mL   Benzodiazepines NEGATIVE <0.50 ng/mL   Buprenorphine NEGATIVE <0.10 ng/mL   Cocaine NEGATIVE <5.0 ng/mL   Fentanyl NEGATIVE <0.10 ng/mL   Heroin Metabolite NEGATIVE <1.0 ng/mL   MARIJUANA NEGATIVE <2.5 ng/mL   MDMA NEGATIVE <10 ng/mL   Meprobamate NEGATIVE <2.5 ng/mL   Methadone NEGATIVE <5.0 ng/mL   Nicotine Metabolite POSITIVE (A) <5.0 ng/mL   Cotinine 20.0 (H) <5.0 ng/mL    Comment: . Cotinine is a metabolite of nicotine.    Opiates POSITIVE (A) <2.5 ng/mL   Codeine Negative <2.5 ng/mL   Dihydrocodeine Negative <2.5 ng/mL   Hydrocodone Negative <2.5 ng/mL   Hydromorphone Negative <2.5 ng/mL   Morphine Negative <2.5 ng/mL   Norhydrocodone Negative <2.5 ng/mL   Noroxycodone 4.0 (H) <2.5 ng/mL    Comment: .  Noroxycodone is a metabolite of oxycodone.    Oxycodone 56.6 (H) <2.5 ng/mL   Oxymorphone Negative <2.5 ng/mL   Phencyclidine NEGATIVE <10 ng/mL   Tapentadol NEGATIVE <5.0 ng/mL   Tramadol POSITIVE (A) <5.0 ng/mL   Tramadol 438.1 (H) <5.0 ng/mL   Zolpidem NEGATIVE <5.0 ng/mL    Comment: . For additional information, please refer to http://education.[facility].com/faq/FAQ186 (This link is being provided for informational/ educational purposes only.) . This drug testing is for medical treatment only. Analysis was performed as non-forensic testing and these results should be used only by  healthcare providers to render diagnosis or treatment, or to monitor progress of medical conditions. . For assistance with interpreting these drug results, please contact a [employer] Toxicology Specialist: [phone] [place] [phone]), M-F, 8am-6pm EST. Marland Kitchen These tests were developed and their analytical performance characteristics have been determined by [facility]. They have not been cleared or approved by the FDA. These assays have been validated pursuant to the CLIA regulations and are used for clinical purposes.   Drug Tox Alc Metab w/Con, Oral Fld     Status: None   Collection Time: 09/23/19 10:36 AM  Result Value Ref Range   Alcohol Metabolite NEGATIVE <25 ng/mL    Comment: . For additional information, please refer to http://education.[facility].com/faq/FAQ183 (This link is being provided for informational/ educational purposes only.) . This drug testing is for medical treatment only. Analysis was performed as non-forensic testing and these results should be used only by healthcare providers to render diagnosis or treatment, or to monitor progress of medical conditions. . For assistance with interpreting these drug results, please contact a [employer] Toxicology Specialist: [phone] [place] [phone]), M-F, 8am-6pm EST. Marland Kitchen These tests were developed and their analytical performance characteristics have been determined by [facility]. They have not been cleared or approved by the FDA. These assays have been validated pursuant to the CLIA regulations and are used for clinical purposes.    Assessment/Plan: 1. Myalgia Based on history presented suspect myalgias initially related to COVID infection. Thankfully no alarm signs or symptoms present today. Is outside window of infectivity. Likely post viral myalgias as I have had some patients have this issue after covid infection. He notes feeling much better today which is good.  Mild muscular tenderness noted. No jaundice of skin. No joint redness or swelling on examination. Will obtain labs to further assess and r/o other potential causes. Supportive measures and OTC medications reviewed. Continue pain management per specialist. Strict ER precautions reviewed with patient who voiced understanding and agreement with the plan.  - CBC with Differential/Platelet - Comprehensive metabolic panel - Urinalysis, Routine w reflex microscopic - Sedimentation rate - CRP High sensitivity - TSH - CK Total (and CKMB)  This visit occurred during the SARS-CoV-2 public health emergency.  Safety protocols were in place, including screening questions prior to the visit, additional usage of staff PPE, and extensive cleaning of exam room while observing appropriate contact time as indicated for disinfecting solutions.     Leeanne Rio, PA-C

## 2019-09-30 LAB — CK TOTAL AND CKMB (NOT AT ARMC)
CK, MB: <0.7 ng/mL (ref 0–5.0)
Total CK: 40 U/L — ABNORMAL LOW (ref 44–196)

## 2019-10-01 ENCOUNTER — Telehealth: Payer: Self-pay

## 2019-10-01 ENCOUNTER — Telehealth: Payer: Self-pay | Admitting: Physician Assistant

## 2019-10-01 ENCOUNTER — Telehealth: Payer: Self-pay | Admitting: *Deleted

## 2019-10-01 ENCOUNTER — Other Ambulatory Visit: Payer: Self-pay | Admitting: Emergency Medicine

## 2019-10-01 DIAGNOSIS — M791 Myalgia, unspecified site: Secondary | ICD-10-CM

## 2019-10-01 MED ORDER — OXYCODONE HCL 5 MG PO TABS: 5.0000 mg | ORAL_TABLET | Freq: Four times a day (QID) | ORAL | 0 refills | Status: DC | PRN

## 2019-10-01 NOTE — Telephone Encounter (Signed)
Pt called in asking for his lab results. He said he is "bad Shape" this morning and wanted to know his lab results and talk to [place] or [place].

## 2019-10-01 NOTE — Telephone Encounter (Signed)
Oral swab drug screen was consistent for prescribed medications.  ?

## 2019-10-01 NOTE — Telephone Encounter (Signed)
Patient informed. 

## 2019-10-01 NOTE — Telephone Encounter (Signed)
Brandon [name] called: He had to double up on his Oxycodone 5 MG. Because of his pain in the shoulder, joint pain, and calves.   He only has 10 pills left and will need more pills.   Please advise. Call back ph# [phone].

## 2019-10-01 NOTE — Telephone Encounter (Signed)
Let him know I sent in more- don't know if insurance will refill early, I can't fix that.

## 2019-10-01 NOTE — Telephone Encounter (Signed)
Called patient and gave lab results. See result note

## 2019-10-01 NOTE — Telephone Encounter (Signed)
Pt had called prior to tell me was taking more pain meds due to a pain exacerbation- I agreed he could take more pain meds during this acute situation- wil increase pain meds to 1-2 tabs of 5 mg QID prn- and increased amount of tabs to 150 tabs- but don't expect this all the time. Since this is first time it's occurred, but don't expect it frequently.

## 2019-10-08 ENCOUNTER — Telehealth: Payer: Self-pay | Admitting: *Deleted

## 2019-10-08 NOTE — Telephone Encounter (Signed)
Oral swab drug screen was consistent for prescribed medications.  ?

## 2019-10-15 ENCOUNTER — Other Ambulatory Visit: Payer: Self-pay | Admitting: Physician Assistant

## 2019-10-19 ENCOUNTER — Telehealth: Payer: Self-pay | Admitting: *Deleted

## 2019-10-19 MED ORDER — PREDNISONE 10 MG (21) PO TBPK: ORAL_TABLET | ORAL | 0 refills | Status: DC

## 2019-10-19 NOTE — Telephone Encounter (Signed)
Still having a lot of muscle aches and joint soreness.   Every morning- taking pain meds.   Tylenol has helped.   Not diabetic- asking if can try steroids for him. :" a friend had mentioned it".    Quit taking doubled up meds.  Will send in Prednisone taper 60 mg x 1 day, then 40 mg x 2 days, then 20 mg x 2 days, then 10 mg x 2 days then 5mg  x 2 days.  Call me back afterwards ot let me know how he's doing.

## 2019-10-19 NOTE — Telephone Encounter (Signed)
Brandon [name] called and is requesting a call from Brandon [name] before she goes on her vacation so he can discuss what is happening with his medication.  His number is [phone].

## 2019-10-27 ENCOUNTER — Other Ambulatory Visit: Payer: Self-pay | Admitting: Physical Medicine and Rehabilitation

## 2019-10-28 ENCOUNTER — Other Ambulatory Visit (HOSPITAL_COMMUNITY): Payer: Self-pay | Admitting: Cardiology

## 2019-10-28 ENCOUNTER — Inpatient Hospital Stay: Payer: Medicare Other | Admitting: Medical

## 2019-10-29 DIAGNOSIS — M791 Myalgia, unspecified site: Secondary | ICD-10-CM | POA: Diagnosis not present

## 2019-10-30 DIAGNOSIS — M25531 Pain in right wrist: Secondary | ICD-10-CM | POA: Diagnosis not present

## 2019-11-04 ENCOUNTER — Other Ambulatory Visit: Payer: Self-pay | Admitting: Physical Medicine and Rehabilitation

## 2019-11-04 ENCOUNTER — Telehealth: Payer: Self-pay

## 2019-11-04 MED ORDER — OXYCODONE HCL 5 MG PO TABS: 5.0000 mg | ORAL_TABLET | Freq: Four times a day (QID) | ORAL | 0 refills | Status: DC | PRN

## 2019-11-04 NOTE — Telephone Encounter (Signed)
Sent!

## 2019-11-04 NOTE — Telephone Encounter (Signed)
Pt.notified

## 2019-11-04 NOTE — Telephone Encounter (Signed)
Patient called requesting Oxycodone refill takes QID. Last filled 10/01/2019, last filled Tramadol 10/15/2019. Can you assist with this in absence of Dr. Dagoberto Ligas?

## 2019-11-06 ENCOUNTER — Other Ambulatory Visit: Payer: Self-pay

## 2019-11-06 ENCOUNTER — Ambulatory Visit (INDEPENDENT_AMBULATORY_CARE_PROVIDER_SITE_OTHER): Payer: Medicare Other

## 2019-11-06 DIAGNOSIS — Z23 Encounter for immunization: Secondary | ICD-10-CM | POA: Diagnosis not present

## 2019-11-26 DIAGNOSIS — M25511 Pain in right shoulder: Secondary | ICD-10-CM | POA: Diagnosis not present

## 2019-11-26 DIAGNOSIS — M25512 Pain in left shoulder: Secondary | ICD-10-CM | POA: Diagnosis not present

## 2019-11-29 ENCOUNTER — Encounter: Payer: Self-pay | Admitting: Physician Assistant

## 2019-12-01 ENCOUNTER — Other Ambulatory Visit: Payer: Self-pay

## 2019-12-01 ENCOUNTER — Ambulatory Visit (INDEPENDENT_AMBULATORY_CARE_PROVIDER_SITE_OTHER): Payer: Medicare Other | Admitting: Physician Assistant

## 2019-12-01 ENCOUNTER — Encounter: Payer: Self-pay | Admitting: Physician Assistant

## 2019-12-01 VITALS — BP 120/78 | HR 78 | Temp 98.1°F | Resp 16 | Ht 68.0 in | Wt 165.2 lb

## 2019-12-01 DIAGNOSIS — M791 Myalgia, unspecified site: Secondary | ICD-10-CM

## 2019-12-01 DIAGNOSIS — R700 Elevated erythrocyte sedimentation rate: Secondary | ICD-10-CM

## 2019-12-01 MED ORDER — PREDNISONE 5 MG PO TABS: 15.0000 mg | ORAL_TABLET | Freq: Every day | ORAL | 1 refills | Status: DC

## 2019-12-01 NOTE — Patient Instructions (Addendum)
Once I get the lab results from Dr. Berenice Primas office, we will call you and get further lab work set up to figure out what is going on.   For now, take the 15 mg Prednisone daily over the next 10 days. Tylenol can be used if ok'd by your liver specialist.  Continue other medications as directed by Dr. Dagoberto Ligas.  Let me know over the next week how symptoms are responding so we can make further adjustments.

## 2019-12-01 NOTE — Progress Notes (Signed)
Patient presents to clinic today c/o ongoing issue with muscle aches and soreness, mainly of shoulders, hips and upper legs bilaterally. This has been going on over the past 2 months. Was seen by [place] initially with concern for recent viral infection (COVID-negative) causing symptoms as it coincided with change in taste. CRP elevated at that time so it was recommended he follow-up in 2 weeks to recheck levels which he did not comply with. Was evaluated by his orthopedist, Dr. Berenice Primas this past week for his symptoms. Started Indomethacin with substantial improvement in symptoms. Repeat labs obtained at that visit and patient endorses he was told he had signs of inflammation and should follow-up with PCP. He is unsure of what labs were drawn. No records available for review at time of visit.   Past Medical History:  Diagnosis Date  . Aortic stenosis    mild echocardiogram 8/09 EF 60%, showed no regional wall motion abnml, mild LVH, mod focal basal septal hypertrophy and mild dyastolic dysfunction. 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 mean gradient across aortic valve was 8 mmHG. also mild L atrial enlargement and normal RV size and function. minimal AS on LHC in 7/10.   . Arthritis    "all over" (07/19/2016)  . BPH (benign prostatic hypertrophy)   . CAD (coronary artery disease)    [facility] (7/10) totally occluded proximal RCA with very robuse L to R collaterals, 50% proximal LAD stenosis, EF 65%, medical management.    . Chicken pox   . Chronic lower back pain    s/p surgical fusion  . Diverticulosis   . Esophageal stricture   . GERD (gastroesophageal reflux disease)   . Heart murmur   . Hepatitis B 1984  . Hiatal hernia   . History of radiation therapy 01/16/18- 03/05/18   Left Tonsil, 66 Gy in 33 fractions to high risk nodal echelons.   Marland Kitchen HLD (hyperlipidemia)   . HTN (hypertension)   . 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 Osteoarthritis   . TIA (transient  ischemic attack) 1990s   hx  . tonsillar ca dx'd 11/2017  . Tubular adenoma of colon 2009    Current Outpatient Medications on File Prior to Visit  Medication Sig Dispense Refill  . amLODipine (NORVASC) 10 MG tablet Take 1 tablet (10 mg total) by mouth daily. 90 tablet 3  . aspirin 81 MG EC tablet Take 81 mg by mouth at bedtime.     Marland Kitchen atorvastatin (LIPITOR) 40 MG tablet Take 1 tablet (40 mg total) by mouth daily. Needs appt 90 tablet 0  . budesonide-formoterol (SYMBICORT) 160-4.5 MCG/ACT inhaler Inhale 2 puffs into the lungs as needed.    . diclofenac (VOLTAREN) 50 MG EC tablet TAKE 1 TABLET(50 MG) BY MOUTH TWICE DAILY FOR 14 DAYS 28 tablet 0  . gabapentin (NEURONTIN) 300 MG capsule TAKE 1 CAPSULE(300 MG) BY MOUTH THREE TIMES DAILY 90 capsule 5  . gabapentin (NEURONTIN) 600 MG tablet Take 1 tablet (600 mg total) by mouth 3 (three) times daily. Start with 600 mg BID x 1 week then 600 mg 3x/day- for nerve pain 180 tablet 5  . hydrocortisone cream 0.5 % Apply topically.    . hydrOXYzine (VISTARIL) 25 MG capsule Take 25 mg by mouth 2 (two) times daily.    . isosorbide mononitrate (IMDUR) 30 MG 24 hr tablet TAKE 1 TABLET(30 MG) BY MOUTH AT BEDTIME 90 tablet 1  . lisinopril (ZESTRIL) 10 MG tablet TAKE 1  TABLET(10 MG) BY MOUTH DAILY 90 tablet 3  . metoprolol tartrate (LOPRESSOR) 25 MG tablet Take 0.5 tablets (12.5 mg total) by mouth 2 (two) times daily. 30 tablet 5  . mirtazapine (REMERON) 30 MG tablet Take 1 tablet (30 mg total) by mouth at bedtime. For appetite 30 tablet 3  . montelukast (SINGULAIR) 10 MG tablet TAKE 1 TABLET(10 MG) BY MOUTH AT BEDTIME 30 tablet 3  . omeprazole (PRILOSEC) 20 MG capsule TAKE 1 CAPSULE(20 MG) BY MOUTH DAILY 90 capsule 1  . oxyCODONE (OXY IR/ROXICODONE) 5 MG immediate release tablet Take 1-2 tablets (5-10 mg total) by mouth every 6 (six) hours as needed for severe pain (to take 2 tabs ONLY when severe pain- which is not usual for pt). 150 tablet 0  . predniSONE  (STERAPRED UNI-PAK 21 TAB) 10 MG (21) TBPK tablet Take 60 mg- 6 tabs the first day, then 40 mg  x2 days- 4 tabs, 2 tabs (20 mg) x 2 days, then 10 mg (1 tab) x 2 days, then 5 mg daily (1/2) x 2 days. 21 tablet 0  . tamsulosin (FLOMAX) 0.4 MG CAPS capsule Take 0.4 mg by mouth daily.     . traMADol (ULTRAM) 50 MG tablet Take 2 tablets (100 mg total) by mouth 2 (two) times daily. 120 tablet 5  . triamcinolone (NASACORT) 55 MCG/ACT AERO nasal inhaler Place 2 sprays into the nose daily. 1 Inhaler 1   No current facility-administered medications on file prior to visit.    Allergies  Allergen Reactions  . Celebrex [Celecoxib] Hives and Itching    Family History  Problem Relation Age of Onset  . Heart disease Father 45       Living  . Coronary artery disease Father        CABG  . Alzheimer's disease Mother 58       Deceased  . Arthritis/Rheumatoid Mother   . Stomach cancer Maternal Uncle   . Brain cancer Maternal Aunt        x2  . Arthritis Brother        DJD  . Obesity Daughter        Had Bypass Sx    Social History   Socioeconomic History  . Marital status: Married    Spouse name: etta  . Number of children: 2  . Years of education: Not on file  . Highest education level: Not on file  Occupational History  . Occupation: retired    Fish farm manager: RETIRED    Comment: disabled due to back problems  Tobacco Use  . Smoking status: Former Smoker    Packs/day: 1.50    Years: 50.00    Pack years: 75.00    Types: Cigarettes    Quit date: 09/05/2017    Years since quitting: 2.2  . Smokeless tobacco: Never Used  . Tobacco comment: smoked less than 1 ppd for 40+ years; Using Vapor Cig  Vaping Use  . Vaping Use: Never used  Substance and Sexual Activity  . Alcohol use: Not Currently    Alcohol/week: 0.0 standard drinks    Comment: 07/19/2016 "might have a few drinks/year"  . Drug use: No  . Sexual activity: Never  Other Topics Concern  . Not on file  Social History Narrative    Married (3rd), [place]. 2 children from 1st marriage, 4 step children.    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  Cell # [phone]   Social Determinants of Health   Financial Resource Strain:   . Difficulty of Paying Living Expenses: Not on file  Food Insecurity: No Food Insecurity  . Worried About Charity fundraiser in the Last Year: Never true  . Ran Out of Food in the Last Year: Never true  Transportation Needs: No Transportation Needs  . Lack of Transportation (Medical): No  . Lack of Transportation (Non-Medical): No  Physical Activity:   . Days of Exercise per Week: Not on file  . Minutes of Exercise per Session: Not on file  Stress:   . Feeling of Stress : Not on file  Social Connections:   . Frequency of Communication with Friends and Family: Not on file  . Frequency of Social Gatherings with Friends and Family: Not on file  . Attends Religious Services: Not on file  . Active Member of Clubs or Organizations: Not on file  . Attends Archivist Meetings: Not on file  . Marital Status: Not on file   Review of Systems - See HPI.  All other ROS are negative.  Wt 165 lb 3.2 oz (74.9 kg)   BMI 25.12 kg/m   Physical Exam Vitals reviewed.  Constitutional:      Appearance: Normal appearance.  HENT:     Head: Normocephalic and atraumatic.  Cardiovascular:     Rate and Rhythm: Normal rate and regular rhythm.     Pulses: Normal pulses.     Heart sounds: Normal heart sounds.  Pulmonary:     Effort: Pulmonary effort is normal.  Musculoskeletal:     Right shoulder: Tenderness present. No swelling, deformity or bony tenderness. Normal strength. Normal pulse.     Left shoulder: Tenderness present. No swelling, deformity or bony tenderness. Normal strength. Normal pulse.     Cervical back: Neck supple.     Right hip: Tenderness present. No bony tenderness. Normal strength.      Left hip: Tenderness present. No bony tenderness. Normal strength.     Right upper leg: Tenderness present. No swelling, edema or bony tenderness.     Left upper leg: Tenderness present. No swelling, edema or bony tenderness.  Neurological:     Mental Status: He is alert.     Recent Results (from the past 2160 hour(s))  Drug Tox Monitor 1 w/Conf, Oral Fld     Status: Abnormal   Collection Time: 09/23/19 10:36 AM  Result Value Ref Range   Amphetamines NEGATIVE <10 ng/mL   Barbiturates NEGATIVE <10 ng/mL   Benzodiazepines NEGATIVE <0.50 ng/mL   Buprenorphine NEGATIVE <0.10 ng/mL   Cocaine NEGATIVE <5.0 ng/mL   Fentanyl NEGATIVE <0.10 ng/mL   Heroin Metabolite NEGATIVE <1.0 ng/mL   MARIJUANA NEGATIVE <2.5 ng/mL   MDMA NEGATIVE <10 ng/mL   Meprobamate NEGATIVE <2.5 ng/mL   Methadone NEGATIVE <5.0 ng/mL   Nicotine Metabolite POSITIVE (A) <5.0 ng/mL   Cotinine 20.0 (H) <5.0 ng/mL    Comment: . Cotinine is a metabolite of nicotine.    Opiates POSITIVE (A) <2.5 ng/mL   Codeine Negative <2.5 ng/mL   Dihydrocodeine Negative <2.5 ng/mL   Hydrocodone Negative <2.5 ng/mL   Hydromorphone Negative <2.5 ng/mL   Morphine Negative <2.5 ng/mL   Norhydrocodone Negative <2.5 ng/mL   Noroxycodone 4.0 (H) <2.5 ng/mL    Comment: . Noroxycodone is a metabolite of oxycodone.    Oxycodone 56.6 (H) <2.5 ng/mL   Oxymorphone Negative <2.5 ng/mL   Phencyclidine NEGATIVE <10 ng/mL  Tapentadol NEGATIVE <5.0 ng/mL   Tramadol POSITIVE (A) <5.0 ng/mL   Tramadol 438.1 (H) <5.0 ng/mL   Zolpidem NEGATIVE <5.0 ng/mL    Comment: . For additional information, please refer to http://education.[facility].com/faq/FAQ186 (This link is being provided for informational/ educational purposes only.) . This drug testing is for medical treatment only. Analysis was performed as non-forensic testing and these results should be used only by healthcare providers to render diagnosis or treatment, or to monitor  progress of medical conditions. . For assistance with interpreting these drug results, please contact a [employer] Toxicology Specialist: [phone] [place] [phone]), M-F, 8am-6pm EST. Marland Kitchen These tests were developed and their analytical performance characteristics have been determined by [facility]. They have not been cleared or approved by the FDA. These assays have been validated pursuant to the CLIA regulations and are used for clinical purposes.   Drug Tox Alc Metab w/Con, Oral Fld     Status: None   Collection Time: 09/23/19 10:36 AM  Result Value Ref Range   Alcohol Metabolite NEGATIVE <25 ng/mL    Comment: . For additional information, please refer to http://education.[facility].com/faq/FAQ183 (This link is being provided for informational/ educational purposes only.) . This drug testing is for medical treatment only. Analysis was performed as non-forensic testing and these results should be used only by healthcare providers to render diagnosis or treatment, or to monitor progress of medical conditions. . For assistance with interpreting these drug results, please contact a [employer] Toxicology Specialist: [phone] [place] [phone]), M-F, 8am-6pm EST. Marland Kitchen These tests were developed and their analytical performance characteristics have been determined by [facility]. They have not been cleared or approved by the FDA. These assays have been validated pursuant to the CLIA regulations and are used for clinical purposes.   CBC with Differential/Platelet     Status: Abnormal   Collection Time: 09/29/19 11:25 AM  Result Value Ref Range   WBC 7.3 4.0 - 10.5 K/uL   RBC 4.43 4.22 - 5.81 Mil/uL   Hemoglobin 11.8 (L) 13.0 - 17.0 g/dL   HCT 35.3 (L) 39 - 52 %   MCV 79.7 78.0 - 100.0 fl   MCHC 33.4 30.0 - 36.0 g/dL   RDW 15.5 11.5 - 15.5 %   Platelets 194.0 150 - 400 K/uL   Neutrophils Relative % 82.4 (H) 43 - 77 %   Lymphocytes  Relative 6.9 Repeated and verified X2. (L) 12 - 46 %   Monocytes Relative 4.9 3 - 12 %   Eosinophils Relative 5.1 (H) 0 - 5 %   Basophils Relative 0.7 0 - 3 %   Neutro Abs 6.0 1.4 - 7.7 K/uL   Lymphs Abs 0.5 (L) 0.7 - 4.0 K/uL   Monocytes Absolute 0.4 0.1 - 1.0 K/uL   Eosinophils Absolute 0.4 0.0 - 0.7 K/uL   Basophils Absolute 0.0 0.0 - 0.1 K/uL  Comprehensive metabolic panel     Status: Abnormal   Collection Time: 09/29/19 11:25 AM  Result Value Ref Range   Sodium 137 135 - 145 mEq/L   Potassium 3.7 3.5 - 5.1 mEq/L   Chloride 99 96 - 112 mEq/L   CO2 30 19 - 32 mEq/L   Glucose, Bld 122 (H) 70 - 99 mg/dL   BUN 23 6 - 23 mg/dL   Creatinine, Ser 0.78 0.40 - 1.50 mg/dL   Total Bilirubin 0.5 0.2 - 1.2 mg/dL   Alkaline Phosphatase 180 (H) 39 - 117 U/L   AST 10 0 - 37 U/L  ALT 8 0 - 53 U/L   Total Protein 6.8 6.0 - 8.3 g/dL   Albumin 3.7 3.5 - 5.2 g/dL   GFR 96.52 >60.00 mL/min   Calcium 8.7 8.4 - 10.5 mg/dL  Urinalysis, Routine w reflex microscopic     Status: None   Collection Time: 09/29/19 11:25 AM  Result Value Ref Range   Color, Urine YELLOW Yellow;Lt. Yellow;Straw;Dark Yellow;Amber;Green;Red;Brown   APPearance CLEAR Clear;Turbid;Slightly Cloudy;Cloudy   Specific Gravity, Urine 1.010 1.000 - 1.030   pH 6.0 5.0 - 8.0   Total Protein, Urine NEGATIVE Negative   Urine Glucose NEGATIVE Negative   Ketones, ur NEGATIVE Negative   Bilirubin Urine NEGATIVE Negative   Hgb urine dipstick NEGATIVE Negative   Urobilinogen, UA 0.2 0.0 - 1.0   Leukocytes,Ua NEGATIVE Negative   Nitrite NEGATIVE Negative   WBC, UA 0-2/hpf 0-2/hpf   RBC / HPF none seen 0-2/hpf   Squamous Epithelial / LPF Rare(0-4/hpf) Rare(0-4/hpf)  Sedimentation rate     Status: Abnormal   Collection Time: 09/29/19 11:25 AM  Result Value Ref Range   Sed Rate 55 (H) 0 - 20 mm/hr  CRP High sensitivity     Status: Abnormal   Collection Time: 09/29/19 11:25 AM  Result Value Ref Range   CRP, High Sensitivity 95.440 (H)  0.000 - 5.000 mg/L    Comment: Note:  An elevated hs-CRP (>5 mg/L) should be repeated after 2 weeks to rule out recent infection or trauma.  TSH     Status: None   Collection Time: 09/29/19 11:25 AM  Result Value Ref Range   TSH 1.84 0.35 - 4.50 uIU/mL  CK Total (and CKMB)     Status: Abnormal   Collection Time: 09/29/19 11:25 AM  Result Value Ref Range   Total CK 40 (L) 44.0 - 196.0 U/L   CK, MB <0.7 0 - 5.0 ng/mL   Relative Index  0 - 4.0    Comment: Result not calculated because one or more required values exceed analytical limits.    Assessment/Plan: 1. Myalgia 2. 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 Records requested from ortho to see what all was checked. Want to make sure RF, ANA and possibly anti-CCP have been checked. If ANA positive would want titer and further antibody testing before referral to Rheumatology. If these were not checked we will get him in ASAP for these labs.  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 Then make further adjustments based on how he responds while awaiting appt with Rheumatology.     This visit occurred during the SARS-CoV-2 public health emergency.  Safety protocols were in place, including screening questions prior to the visit, additional usage of staff PPE, and extensive cleaning of exam room while observing appropriate contact time as indicated for disinfecting solutions.     Leeanne Rio, PA-C

## 2019-12-03 ENCOUNTER — Telehealth: Payer: Self-pay | Admitting: *Deleted

## 2019-12-03 ENCOUNTER — Telehealth: Payer: Self-pay | Admitting: Physician Assistant

## 2019-12-03 DIAGNOSIS — R700 Elevated erythrocyte sedimentation rate: Secondary | ICD-10-CM

## 2019-12-03 DIAGNOSIS — M791 Myalgia, unspecified site: Secondary | ICD-10-CM

## 2019-12-03 NOTE — Telephone Encounter (Signed)
r Brandon [name] called to request his oxycodone  Be refilled but he says he does not need as much as last time. He is being seen about his arthritic condition through sports medicine and Dr Berenice Primas and he feels better and thinks #120 will suffice instead of the #150 he got last time. By Pmp it was filled #150 11/04/19

## 2019-12-03 NOTE — Telephone Encounter (Signed)
Received recent lab results from his orthopedic provider.  Elevated sed rate and C-reactive protein as we have noted on her lab work before.  Negative ANA and rheumatoid testing which is good.  This further supports the potential diagnosis for polymyalgia rheumatica versus polymyositis.  Would like to proceed with rheumatology referral.  Do not feel we need to get additional lab work at this time.  Has he started his prednisone yet?  If not he needs to do so.  If so, want him to message me in a week to let me know how things are going.

## 2019-12-03 NOTE — Addendum Note (Signed)
Addended by: Leonidas Romberg on: 12/03/2019 01:46 PM   Modules accepted: Orders

## 2019-12-03 NOTE — Telephone Encounter (Signed)
Spoke with patient about lab results drawn from the Ortho office. 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 He is agreeable with referral to Rheumatology. Referral placed. He has started the Prednisone and still having pain in his shoulders but the pain is improving everywhere else.  Advised to let us know how his pain is doing next week.

## 2019-12-04 MED ORDER — OXYCODONE HCL 5 MG PO TABS: 5.0000 mg | ORAL_TABLET | Freq: Four times a day (QID) | ORAL | 0 refills | Status: DC | PRN

## 2019-12-04 NOTE — Telephone Encounter (Signed)
Called in Oxycodone # 120 for him-thank you

## 2019-12-10 ENCOUNTER — Encounter: Payer: Self-pay | Admitting: Physician Assistant

## 2019-12-15 DIAGNOSIS — Z08 Encounter for follow-up examination after completed treatment for malignant neoplasm: Secondary | ICD-10-CM | POA: Diagnosis not present

## 2019-12-15 DIAGNOSIS — Z923 Personal history of irradiation: Secondary | ICD-10-CM | POA: Diagnosis not present

## 2019-12-15 DIAGNOSIS — Z9889 Other specified postprocedural states: Secondary | ICD-10-CM | POA: Diagnosis not present

## 2019-12-15 DIAGNOSIS — C108 Malignant neoplasm of overlapping sites of oropharynx: Secondary | ICD-10-CM | POA: Diagnosis not present

## 2019-12-15 DIAGNOSIS — Z85810 Personal history of malignant neoplasm of tongue: Secondary | ICD-10-CM | POA: Diagnosis not present

## 2019-12-20 ENCOUNTER — Encounter: Payer: Self-pay | Admitting: Physician Assistant

## 2019-12-21 MED ORDER — PREDNISONE 5 MG PO TABS: 15.0000 mg | ORAL_TABLET | Freq: Every day | ORAL | 1 refills | Status: DC

## 2019-12-23 ENCOUNTER — Other Ambulatory Visit: Payer: Self-pay | Admitting: Emergency Medicine

## 2019-12-23 ENCOUNTER — Encounter
Payer: Medicare Other | Attending: Physical Medicine and Rehabilitation | Admitting: Physical Medicine and Rehabilitation

## 2019-12-23 DIAGNOSIS — K219 Gastro-esophageal reflux disease without esophagitis: Secondary | ICD-10-CM

## 2019-12-23 DIAGNOSIS — Z79891 Long term (current) use of opiate analgesic: Secondary | ICD-10-CM | POA: Insufficient documentation

## 2019-12-23 DIAGNOSIS — Z5181 Encounter for therapeutic drug level monitoring: Secondary | ICD-10-CM | POA: Insufficient documentation

## 2019-12-23 DIAGNOSIS — G894 Chronic pain syndrome: Secondary | ICD-10-CM | POA: Insufficient documentation

## 2019-12-23 MED ORDER — OMEPRAZOLE 20 MG PO CPDR: DELAYED_RELEASE_CAPSULE | ORAL | 2 refills | Status: DC

## 2019-12-25 ENCOUNTER — Telehealth: Payer: Self-pay | Admitting: *Deleted

## 2019-12-25 NOTE — Telephone Encounter (Signed)
Brandon [name] called and reports that he missed his appt due to a medical emergency with his wife.  He has rescheduled but cant be seen until January 3rd.  He wants [name] to know that his blood tests did not show he has RA but does have inflammation.  He sees a [employer] officer on 01/11/20.  He is on prednisone and doing well, oxycodone is being taken 4x day.

## 2019-12-25 NOTE — Telephone Encounter (Signed)
He thinks he might have polymyalgia rheumatica? PMR? Has rheumatology appointment 12/6- and will figure out what is the dx- pain jumping around from muscles to muscles.  Prednisone is helping a lot 90% better- when quit taking, things got worse-  Back on Prednisone- from PCP/PA.  On 15 mg daily right now-  and about to reduce dose.   Thought had COVID earlier in year, when had no taste earlier in year.  Now taste is back-? Had tested (-) x2- and had shots and boosters.   Can reduce gabapentin  by 300 mg/week and tramadol slightly less over time.  Taking Oxycodone ~ 4 tabs/day.  Still. Let me know after 12/6 appointment-

## 2019-12-28 NOTE — Progress Notes (Signed)
Office Visit Note  Patient: Brandon [name]             Date of Birth: July 26, 1940           MRN: [id number]             PCP: Delorse Limber Referring: Brunetta Jeans, PA-C Visit Date: 01/11/2020 Occupation: _0 @  Subjective:  Pain in joints.   History of Present Illness: Brandon [name] is a 79 y.o. 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  According to the patient he was diagnosed with degenerative disc disease in 1990s.  In 1995 he had lumbar spine fusion by Dr. Sherwood Gambler.  He states he has off-and-on sciatica since then.  He had a very busy life and he worked as a Chief Strategy Officer for about 46 years.  He states it was hard on his joints.  He has been diagnosed with osteoarthritis in his hands about 20 years ago by Dr. Berenice Primas.  He states about 14 weeks ago he lost taste completely.  He had  Covid test twice which was negative.  He states he woke up on a Saturday morning and could not get out of the bed.  EMS was called but he was advised to wait until Monday.  He states on Monday he went to see Dr. Berenice Primas and where the labs were performed and his sed rate was 55.  He was initially given anti-inflammatories which he stopped.  He was given prednisone 15 mg p.o. daily which resolved his symptoms.  He states he was having difficulty getting out of the bed, getting out of the chair, and raising his arms.  He took prednisone for 15 days and then stopped it.  After stopping prednisone 3 days later his symptoms recurred.  At that time he was placed back on prednisone 15 mg a day.  He states that his symptoms resolved on prednisone.  Currently he describes no muscular weakness or tenderness.  He continues to have some lower back discomfort and goes to the pain management for the lower back pain.  He developed psoriasis 15 years ago which lasted for 1 year.  He states he used topical agents by his dermatologist and had no recurrence of psoriasis.  Activities of Daily Living:   Patient reports morning stiffness for  0 minutes.   Patient Denies nocturnal pain.  Difficulty dressing/grooming: Denies Difficulty climbing stairs: Denies Difficulty getting out of chair: Denies Difficulty using hands for taps, buttons, cutlery, and/or writing: Reports  Review of Systems  Constitutional: Negative for fatigue.  HENT: Positive for mouth dryness. Negative for mouth sores and nose dryness.   Eyes: Negative for pain, itching and dryness.  Respiratory: Positive for shortness of breath. Negative for difficulty breathing.   Cardiovascular: Negative for chest pain and palpitations.  Gastrointestinal: Negative for blood in stool, constipation and diarrhea.  Endocrine: Positive for increased urination.  Genitourinary: Positive for painful urination. Negative for difficulty urinating.  Musculoskeletal: Positive for arthralgias, joint pain, myalgias, muscle tenderness and myalgias. Negative for joint swelling and morning stiffness.  Skin: Negative for color change, rash and redness.  Allergic/Immunologic: Negative for susceptible to infections.  Neurological: Negative for dizziness, numbness, headaches, memory loss and weakness.  Hematological: Positive for bruising/bleeding tendency.  Psychiatric/Behavioral: Negative for confusion.    PMFS History:  Patient Active Problem List   Diagnosis Date Noted  . Primary osteoarthritis of both hands 01/11/2020  . Piriformis syndrome of both sides 09/23/2019  .  Pain of both shoulder joints 07/29/2019  . Chronic right hip pain 07/29/2019  . Sciatica associated with disorder of lumbar spine 04/24/2019  . Primary osteoarthritis involving multiple joints 04/24/2019  . Chronic pain syndrome 04/24/2019  . Ankyloglossia 06/23/2018  . Encounter for chronic pain management 04/15/2018  . Anxiety and depression 04/15/2018  . 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 Bruising 10/02/2016  . Liver  abscess 07/10/2016  . Aneurysm of infrarenal abdominal aorta (HCC) 07/10/2016  . Normocytic anemia 07/10/2016  . Tobacco abuse disorder 01/10/2014  . Seasonal allergies 11/23/2013  . Essential hypertension 05/19/2013  . Osteoarthritis 05/29/2009  . 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 Aortic valve disorder 01/28/2008  . BRAIN STEM STROKE 08/27/2007  . 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 BENIGN PROSTATIC HYPERTROPHY, HX OF 08/27/2007  . Hyperlipidemia 03/19/2007  . Anxiety state 03/19/2007  . GERD 03/19/2007    Past Medical History:  Diagnosis Date  . Aortic stenosis    mild echocardiogram 8/09 EF 60%, showed no regional wall motion abnml, mild LVH, mod focal basal septal hypertrophy and mild dyastolic dysfunction. 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 mean gradient across aortic valve was 8 mmHG. also mild L atrial enlargement and normal RV size and function. minimal AS on LHC in 7/10.   . Arthritis    "all over" (07/19/2016)  . BPH (benign prostatic hypertrophy)   . CAD (coronary artery disease)    [facility] (7/10) totally occluded proximal RCA with very robuse L to R collaterals, 50% proximal LAD stenosis, EF 65%, medical management.    . Chicken pox   . Chronic lower back pain    s/p surgical fusion  . Diverticulosis   . Esophageal stricture   . GERD (gastroesophageal reflux disease)   . Heart murmur   . Hepatitis B 1984  . Hiatal hernia   . History of radiation therapy 01/16/18- 03/05/18   Left Tonsil, 66 Gy in 33 fractions to high risk nodal echelons.   Marland Kitchen HLD (hyperlipidemia)   . HTN (hypertension)   . 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 Osteoarthritis   . TIA (transient ischemic attack) 1990s   hx  . tonsillar ca dx'd 11/2017  . Tubular adenoma of colon 2009    Family History  Problem Relation Age of Onset  . Heart disease Father 75       Living  . Coronary artery disease Father        CABG  . Alzheimer's disease Mother 27       Deceased   . Arthritis Mother   . Aneurysm Brother   . Stomach cancer Maternal Uncle   . Brain cancer Maternal Aunt        x2  . Obesity Daughter        Had Bypass Sx   Past Surgical History:  Procedure Laterality Date  . BACK SURGERY    . CATARACT EXTRACTION W/ INTRAOCULAR LENS  IMPLANT, BILATERAL Bilateral 01/2012 - 02/2012  . DIRECT LARYNGOSCOPY Left 10/09/2017   Procedure: DIRECT LARYNGOSCOPY WITH BOPSY;  Surgeon: Jodi Marble, MD;  Location: [facility];  Service: ENT;  Laterality: Left;  . ESOPHAGOGASTRODUODENOSCOPY (EGD) WITH ESOPHAGEAL DILATION     "couple times" (07/19/2016)  . ESOPHAGOSCOPY Left 10/09/2017   Procedure: ESOPHAGOSCOPY;  Surgeon: Jodi Marble, MD;  Location: [facility];  Service: ENT;  Laterality: Left;  . IR GASTROSTOMY TUBE MOD SED  01/08/2018  .  IR THORACENTESIS ASP PLEURAL SPACE W/IMG GUIDE  07/19/2016  . LAPAROSCOPIC CHOLECYSTECTOMY  1994  . LUMBAR DISC SURGERY  05/1996   L4-5; Dr. Sherwood Gambler   . LUMBAR LAMINECTOMY/DECOMPRESSION MICRODISCECTOMY  10/2002   L3-4. Dr. Sherwood Gambler  . MULTIPLE TOOTH EXTRACTIONS  1980s  . PARTIAL GLOSSECTOMY  12/02/2017   Dr. Nicolette Bang- [facility]  . pharyngoplasty for closure of tingue base defect  12/02/2017   Dr. Nicolette Bang- [facility]  . PICC LINE INSERTION  07/15/2016  . POSTERIOR LUMBAR FUSION  09/1996   Ray cage, L4-5 Dr. Rita Ohara  . PROSTATE BIOPSY  ~ 2017  . radical tonsillectomy Left 12/02/2017   Dr[facility]ico[facility] at [facility]  . RIGID BRONCHOSCOPY Left 10/09/2017   Procedure: RIGID BRONCHOSCOPY;  Surgeon: Jodi Marble, MD;  Location: [place];  Service: ENT;  Laterality: Left;  . TRACHEOSTOMY  12/02/2017   Dr. Nicolette Bang- [facility]   Social History   Social History Narrative   Married (3rd), [place]. 2 children from 1st marriage, 4 step children.    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       Cell # [id number]    Immunization History  Administered Date(s) Administered  . Fluad Quad(high Dose 65+) 10/15/2018, 11/06/2019  . Influenza Split 11/26/2010, 10/11/2011, 11/03/2012  . Influenza Whole 11/14/2006, 12/04/2007, 10/06/2008, 10/25/2009  . Influenza, High Dose Seasonal PF 11/01/2016, 10/30/2017  . Influenza, Seasonal, Injecte, Preservative Fre 12/07/2014  . Influenza,inj,Quad PF,6+ Mos 11/04/2015  . Influenza-Unspecified 11/19/2013  . PFIZER SARS-COV-2 Vaccination 02/25/2019, 03/18/2019, 11/16/2019  . Pneumococcal Conjugate-13 12/07/2014  . Pneumococcal Polysaccharide-23 05/30/2017     Objective: Vital Signs: BP (!) 165/74 (BP Location: Right Arm, Patient Position: Sitting, Cuff Size: Normal)   Pulse 61   Resp 16   Ht 5' 6.25" (1.683 m)   Wt 172 lb 12.8 oz (78.4 kg)   BMI 27.68 kg/m    Physical Exam Vitals and nursing note reviewed.  Constitutional:      Appearance: He is well-developed.  HENT:     Head: Normocephalic and atraumatic.  Eyes:     Conjunctiva/sclera: Conjunctivae normal.     Pupils: Pupils are equal, round, and reactive to light.  Cardiovascular:     Rate and Rhythm: Normal rate and regular rhythm.     Heart sounds: Normal heart sounds.  Pulmonary:     Effort: Pulmonary effort is normal.     Breath sounds: Normal breath sounds.  Abdominal:     General: Bowel sounds are normal.     Palpations: Abdomen is soft.  Musculoskeletal:     Cervical back: Normal range of motion and neck supple.  Skin:    General: Skin is warm and dry.     Capillary Refill: Capillary refill takes less than 2 seconds.  Neurological:     Mental Status: He is alert and oriented to person, place, and time.  Psychiatric:        Behavior: Behavior normal.      Musculoskeletal Exam: He has limited range of motion of the cervical spine due to tight muscles.  He has limited range of motion for lumbar spine without discomfort.  Shoulder joints, elbow joints, wrist joints with good range of  motion.  He has bilateral CMC PIP and DIP thickening consistent with osteoarthritis.  Hip joints, knee joints, ankles, MTPs and PIPs with good range of motion with no synovitis.  He has no muscular weakness  or tenderness on my examination today.  CDAI Exam: CDAI Score: - Patient Global: -; Provider Global: - Swollen: -; Tender: - Joint Exam 01/11/2020   No joint exam has been documented for this visit   There is currently no information documented on the homunculus. Go to the Rheumatology activity and complete the homunculus joint exam.  Investigation: No additional findings.  Imaging: No results found.  Recent Labs: Lab Results  Component Value Date   WBC 7.3 09/29/2019   HGB 11.8 (L) 09/29/2019   PLT 194.0 09/29/2019   NA 137 09/29/2019   K 3.7 09/29/2019   CL 99 09/29/2019   CO2 30 09/29/2019   GLUCOSE 122 (H) 09/29/2019   BUN 23 09/29/2019   CREATININE 0.78 09/29/2019   BILITOT 0.5 09/29/2019   ALKPHOS 180 (H) 09/29/2019   AST 10 09/29/2019   ALT 8 09/29/2019   PROT 6.8 09/29/2019   ALBUMIN 3.7 09/29/2019   CALCIUM 8.7 09/29/2019   GFRAA >60 01/22/2019    Speciality Comments: No specialty comments available.  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  He gives history of muscular weakness and tenderness which was sudden onset.  He had very good response to prednisone and the symptoms recurred after stopping the prednisone.  He has been on prednisone 15 mg a day for the last 3 weeks and has been doing well.  Detailed counseling polymyalgia rheumatica was provided.  Treatment options were discussed.  At this point I would like to taper prednisone gradually if the sed rate is normal.  We will get sed rate today.  The plan is to decrease prednisone by 5 mg.  He will take prednisone 10 mg p.o. daily for 1 month and  then will taper by 1 mg every month as tolerated.  Using DMARD with the prednisone can help to taper prednisone as well.  I will obtain some labs in anticipation to start him on a DMARD therapy.  Elevated sed rate - 09/29/19: CK 40, ESR 55, CRP 95.44, TSH 1.84  Psoriasis-patient states he had an episode of psoriasis 15 years ago which lasted for 1 year and had no recurrence of psoriasis.  There is no family history of psoriasis.  Use topical agents and the symptoms resolved.  DDD (degenerative disc disease), lumbar - s/p fusion - Dr. Sherwood Gambler.  He goes to pain management.  Primary osteoarthritis of both hands-followed by Dr. Berenice Primas.  He has bilateral CMC PIP and DIP thickening.  Joint protection muscle strengthening was discussed.  Chronic pain syndrome - Plan: Rheumatoid factor, Cyclic citrul peptide antibody, IgG, ANA  High risk medication use - Plan: CBC with Differential/Platelet, COMPLETE METABOLIC PANEL WITH GFR, Sedimentation rate, Hepatitis B core antibody, IgM, Hepatitis B surface antigen, Hepatitis C antibody, HIV Antibody (routine testing w rflx), QuantiFERON-TB Gold Plus, Serum protein electrophoresis with reflex, IgG, IgA, IgM, Glucose 6 phosphate dehydrogenase  Liver abscess - 2018 due to diverticulitis.  Patient states he was treated with IV antibiotics for over 3 weeks and also had a drain.  He also thinks that he may have hepatitis B..  History of diverticulitis - Followed by Dr. Fuller Plan  History of gastroesophageal reflux (GERD)  Oropharyngeal dysphagia-he relates it to radiation therapy done for tonsillar cancer.  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  Have advised him to monitor blood pressure closely.  Aneurysm of infrarenal abdominal aorta (HCC)  Anxiety and depression  History of hyperlipidemia  Tobacco abuse disorder - 1PPDx 50 years . Trying to quit.  Orders: Orders  Placed This Encounter  Procedures  . CBC with Differential/Platelet  . COMPLETE METABOLIC PANEL WITH GFR  . Sedimentation rate  . Rheumatoid factor  . Cyclic citrul peptide antibody, IgG  . ANA  . Hepatitis B core antibody, IgM  . Hepatitis B surface antigen  . Hepatitis C antibody  . HIV Antibody (routine testing w rflx)  . QuantiFERON-TB Gold Plus  . Serum protein electrophoresis with reflex  . IgG, IgA, IgM  . Glucose 6 phosphate dehydrogenase   No orders of the defined types were placed in this encounter.   Follow-Up Instructions: Return for PMR, OA.   Bo Merino, MD  Note - This record has been created using Editor, commissioning.  Chart creation errors have been sought, but may not always  have been located. Such creation errors do not reflect on  the standard of medical care.

## 2020-01-08 ENCOUNTER — Telehealth: Payer: Self-pay | Admitting: *Deleted

## 2020-01-08 MED ORDER — OXYCODONE HCL 5 MG PO TABS
5.0000 mg | ORAL_TABLET | Freq: Four times a day (QID) | ORAL | 0 refills | Status: DC | PRN
Start: 2020-01-08 — End: 2020-02-08

## 2020-01-08 NOTE — Telephone Encounter (Signed)
I refilled it.  thanks °

## 2020-01-08 NOTE — Telephone Encounter (Signed)
Refilled oxycodone 5-10 mg q6 hours prn- #120-  Last refill 10/29- doing well- con't regimen

## 2020-01-08 NOTE — Telephone Encounter (Signed)
Brandon [name] called to get a refill on his oxycodone.  His last fill per PMP was 12/04/19.

## 2020-01-11 ENCOUNTER — Other Ambulatory Visit: Payer: Self-pay

## 2020-01-11 ENCOUNTER — Encounter: Payer: Self-pay | Admitting: Rheumatology

## 2020-01-11 ENCOUNTER — Ambulatory Visit: Payer: Medicare Other | Admitting: Rheumatology

## 2020-01-11 VITALS — BP 165/74 | HR 61 | Resp 16 | Ht 66.25 in | Wt 172.8 lb

## 2020-01-11 DIAGNOSIS — M791 Myalgia, unspecified site: Secondary | ICD-10-CM

## 2020-01-11 DIAGNOSIS — Z8719 Personal history of other diseases of the digestive system: Secondary | ICD-10-CM

## 2020-01-11 DIAGNOSIS — L409 Psoriasis, unspecified: Secondary | ICD-10-CM

## 2020-01-11 DIAGNOSIS — Z79899 Other long term (current) drug therapy: Secondary | ICD-10-CM | POA: Diagnosis not present

## 2020-01-11 DIAGNOSIS — M353 Polymyalgia rheumatica: Secondary | ICD-10-CM | POA: Diagnosis not present

## 2020-01-11 DIAGNOSIS — M8949 Other hypertrophic osteoarthropathy, multiple sites: Secondary | ICD-10-CM

## 2020-01-11 DIAGNOSIS — F32A Depression, unspecified: Secondary | ICD-10-CM

## 2020-01-11 DIAGNOSIS — R700 Elevated erythrocyte sedimentation rate: Secondary | ICD-10-CM | POA: Diagnosis not present

## 2020-01-11 DIAGNOSIS — I10 Essential (primary) hypertension: Secondary | ICD-10-CM

## 2020-01-11 DIAGNOSIS — C090 Malignant neoplasm of tonsillar fossa: Secondary | ICD-10-CM

## 2020-01-11 DIAGNOSIS — G5703 Lesion of sciatic nerve, bilateral lower limbs: Secondary | ICD-10-CM

## 2020-01-11 DIAGNOSIS — I7143 Infrarenal abdominal aortic aneurysm, without rupture: Secondary | ICD-10-CM

## 2020-01-11 DIAGNOSIS — R1312 Dysphagia, oropharyngeal phase: Secondary | ICD-10-CM

## 2020-01-11 DIAGNOSIS — M19042 Primary osteoarthritis, left hand: Secondary | ICD-10-CM

## 2020-01-11 DIAGNOSIS — K750 Abscess of liver: Secondary | ICD-10-CM

## 2020-01-11 DIAGNOSIS — M19041 Primary osteoarthritis, right hand: Secondary | ICD-10-CM | POA: Insufficient documentation

## 2020-01-11 DIAGNOSIS — M5386 Other specified dorsopathies, lumbar region: Secondary | ICD-10-CM

## 2020-01-11 DIAGNOSIS — G894 Chronic pain syndrome: Secondary | ICD-10-CM | POA: Diagnosis not present

## 2020-01-11 DIAGNOSIS — Z720 Tobacco use: Secondary | ICD-10-CM

## 2020-01-11 DIAGNOSIS — M5136 Other intervertebral disc degeneration, lumbar region: Secondary | ICD-10-CM

## 2020-01-11 DIAGNOSIS — M51369 Other intervertebral disc degeneration, lumbar region without mention of lumbar back pain or lower extremity pain: Secondary | ICD-10-CM

## 2020-01-11 DIAGNOSIS — I714 Abdominal aortic aneurysm, without rupture: Secondary | ICD-10-CM

## 2020-01-11 DIAGNOSIS — F419 Anxiety disorder, unspecified: Secondary | ICD-10-CM

## 2020-01-11 DIAGNOSIS — Z8639 Personal history of other endocrine, nutritional and metabolic disease: Secondary | ICD-10-CM

## 2020-01-11 MED ORDER — PREDNISONE 5 MG PO TABS: 10.0000 mg | ORAL_TABLET | Freq: Every day | ORAL | 0 refills | Status: DC

## 2020-01-11 NOTE — Patient Instructions (Addendum)
Please take prednisone 15 mg by mouth daily for 1 month Then reduce prednisone to 10 mg daily for 1 month and then you will decrease prednisone by 1 mg every month.    Prednisone tablets What is this medicine? PREDNISONE (PRED ni sone) is a corticosteroid. It is commonly used to treat inflammation of the skin, joints, lungs, and other organs. Common conditions treated include asthma, allergies, and arthritis. It is also used for other conditions, such as blood disorders and diseases of the adrenal glands. This medicine may be used for other purposes; ask your health care provider or pharmacist if you have questions. COMMON BRAND NAME(S): Deltasone, Predone, Sterapred, Sterapred DS What should I tell my health care provider before I take this medicine? They need to know if you have any of these conditions:  Cushing's syndrome  diabetes  glaucoma  heart disease  high blood pressure  infection (especially a virus infection such as chickenpox, cold sores, or herpes)  kidney disease  liver disease  mental illness  myasthenia gravis  osteoporosis  seizures  stomach or intestine problems  thyroid disease  an unusual or allergic reaction to lactose, prednisone, other medicines, foods, dyes, or preservatives  pregnant or trying to get pregnant  breast-feeding How should I use this medicine? Take this medicine by mouth with a glass of water. Follow the directions on the prescription label. Take this medicine with food. If you are taking this medicine once a day, take it in the morning. Do not take more medicine than you are told to take. Do not suddenly stop taking your medicine because you may develop a severe reaction. Your doctor will tell you how much medicine to take. If your doctor wants you to stop the medicine, the dose may be slowly lowered over time to avoid any side effects. Talk to your pediatrician regarding the use of this medicine in children. Special care may be  needed. Overdosage: If you think you have taken too much of this medicine contact a poison control center or emergency room at once. NOTE: This medicine is only for you. Do not share this medicine with others. What if I miss a dose? If you miss a dose, take it as soon as you can. If it is almost time for your next dose, talk to your doctor or health care professional. You may need to miss a dose or take an extra dose. Do not take double or extra doses without advice. What may interact with this medicine? Do not take this medicine with any of the following medications:  metyrapone  mifepristone This medicine may also interact with the following medications:  aminoglutethimide  amphotericin B  aspirin and aspirin-like medicines  barbiturates  certain medicines for diabetes, like glipizide or glyburide  cholestyramine  cholinesterase inhibitors  cyclosporine  digoxin  diuretics  ephedrine  male hormones, like estrogens and birth control pills  isoniazid  ketoconazole  NSAIDS, medicines for pain and inflammation, like ibuprofen or naproxen  phenytoin  rifampin  toxoids  vaccines  warfarin This list may not describe all possible interactions. Give your health care provider a list of all the medicines, herbs, non-prescription drugs, or dietary supplements you use. Also tell them if you smoke, drink alcohol, or use illegal drugs. Some items may interact with your medicine. What should I watch for while using this medicine? Visit your doctor or health care professional for regular checks on your progress. If you are taking this medicine over a prolonged period, carry  an identification card with your name and address, the type and dose of your medicine, and your doctor's name and address. This medicine may increase your risk of getting an infection. Tell your doctor or health care professional if you are around anyone with measles or chickenpox, or if you develop sores  or blisters that do not heal properly. If you are going to have surgery, tell your doctor or health care professional that you have taken this medicine within the last twelve months. Ask your doctor or health care professional about your diet. You may need to lower the amount of salt you eat. This medicine may increase blood sugar. Ask your healthcare provider if changes in diet or medicines are needed if you have diabetes. What side effects may I notice from receiving this medicine? Side effects that you should report to your doctor or health care professional as soon as possible:  allergic reactions like skin rash, itching or hives, swelling of the face, lips, or tongue  changes in emotions or moods  changes in vision  depressed mood  eye pain  fever or chills, cough, sore throat, pain or difficulty passing urine  signs and symptoms of high blood sugar such as being more thirsty or hungry or having to urinate more than normal. You may also feel very tired or have blurry vision.  swelling of ankles, feet Side effects that usually do not require medical attention (report to your doctor or health care professional if they continue or are bothersome):  confusion, excitement, restlessness  headache  nausea, vomiting  skin problems, acne, thin and shiny skin  trouble sleeping  weight gain This list may not describe all possible side effects. Call your doctor for medical advice about side effects. You may report side effects to FDA at 1-800-FDA-1088. Where should I keep my medicine? Keep out of the reach of children. Store at room temperature between 15 and 30 degrees C (59 and 86 degrees F). Protect from light. Keep container tightly closed. Throw away any unused medicine after the expiration date. NOTE: This sheet is a summary. It may not cover all possible information. If you have questions about this medicine, talk to your doctor, pharmacist, or health care provider.  2020  Elsevier/Gold Standard (2017-10-22 10:54:22)

## 2020-01-13 NOTE — Progress Notes (Signed)
Sed rate is normal now.  He will continue with the prednisone taper.  I will discuss rest the labs at the follow-up visit.

## 2020-01-15 DIAGNOSIS — N401 Enlarged prostate with lower urinary tract symptoms: Secondary | ICD-10-CM | POA: Diagnosis not present

## 2020-01-15 DIAGNOSIS — R350 Frequency of micturition: Secondary | ICD-10-CM | POA: Diagnosis not present

## 2020-01-15 NOTE — Progress Notes (Signed)
ANA is significantly high.  Please add ENA, C3 and C4 if possible

## 2020-01-16 ENCOUNTER — Other Ambulatory Visit: Payer: Self-pay | Admitting: Physician Assistant

## 2020-01-16 LAB — ANA: Anti Nuclear Antibody (ANA): POSITIVE — AB

## 2020-01-16 LAB — CBC WITH DIFFERENTIAL/PLATELET
Absolute Monocytes: 367 cells/uL (ref 200–950)
Basophils Absolute: 10 cells/uL (ref 0–200)
Basophils Relative: 0.1 %
Eosinophils Absolute: 92 cells/uL (ref 15–500)
Eosinophils Relative: 0.9 %
HCT: 39.2 % (ref 38.5–50.0)
Hemoglobin: 12.2 g/dL — ABNORMAL LOW (ref 13.2–17.1)
Lymphs Abs: 653 cells/uL — ABNORMAL LOW (ref 850–3900)
MCH: 25.1 pg — ABNORMAL LOW (ref 27.0–33.0)
MCHC: 31.1 g/dL — ABNORMAL LOW (ref 32.0–36.0)
MCV: 80.7 fL (ref 80.0–100.0)
MPV: 10.4 fL (ref 7.5–12.5)
Monocytes Relative: 3.6 %
Neutro Abs: 9078 cells/uL — ABNORMAL HIGH (ref 1500–7800)
Neutrophils Relative %: 89 %
Platelets: 163 10*3/uL (ref 140–400)
RBC: 4.86 10*6/uL (ref 4.20–5.80)
RDW: 14.3 % (ref 11.0–15.0)
Total Lymphocyte: 6.4 %
WBC: 10.2 10*3/uL (ref 3.8–10.8)

## 2020-01-16 LAB — HEPATITIS C ANTIBODY
Hepatitis C Ab: NONREACTIVE
SIGNAL TO CUT-OFF: 0.02 (ref <1.00)

## 2020-01-16 LAB — PROTEIN ELECTROPHORESIS, SERUM, WITH REFLEX
Albumin ELP: 3.7 g/dL — ABNORMAL LOW (ref 3.8–4.8)
Alpha 1: 0.4 g/dL — ABNORMAL HIGH (ref 0.2–0.3)
Alpha 2: 0.8 g/dL (ref 0.5–0.9)
Beta 2: 0.3 g/dL (ref 0.2–0.5)
Beta Globulin: 0.5 g/dL (ref 0.4–0.6)
Gamma Globulin: 0.8 g/dL (ref 0.8–1.7)
Total Protein: 6.4 g/dL (ref 6.1–8.1)

## 2020-01-16 LAB — COMPLETE METABOLIC PANEL WITH GFR
AG Ratio: 1.7 (calc) (ref 1.0–2.5)
ALT: 6 U/L — ABNORMAL LOW (ref 9–46)
AST: 10 U/L (ref 10–35)
Albumin: 4.1 g/dL (ref 3.6–5.1)
Alkaline phosphatase (APISO): 109 U/L (ref 35–144)
BUN/Creatinine Ratio: 27 (calc) — ABNORMAL HIGH (ref 6–22)
BUN: 17 mg/dL (ref 7–25)
CO2: 28 mmol/L (ref 20–32)
Calcium: 9 mg/dL (ref 8.6–10.3)
Chloride: 99 mmol/L (ref 98–110)
Creat: 0.64 mg/dL — ABNORMAL LOW (ref 0.70–1.18)
GFR, Est African American: 109 mL/min/{1.73_m2} (ref >=60)
GFR, Est Non African American: 94 mL/min/{1.73_m2} (ref >=60)
Globulin: 2.4 g/dL (calc) (ref 1.9–3.7)
Glucose, Bld: 108 mg/dL (ref 65–139)
Potassium: 3.6 mmol/L (ref 3.5–5.3)
Sodium: 139 mmol/L (ref 135–146)
Total Bilirubin: 0.5 mg/dL (ref 0.2–1.2)
Total Protein: 6.5 g/dL (ref 6.1–8.1)

## 2020-01-16 LAB — IGG, IGA, IGM
IgG (Immunoglobin G), Serum: 830 mg/dL (ref 600–1540)
IgM, Serum: 116 mg/dL (ref 50–300)
Immunoglobulin A: 163 mg/dL (ref 70–320)

## 2020-01-16 LAB — RHEUMATOID FACTOR: Rheumatoid fact SerPl-aCnc: <14 IU/mL (ref <14)

## 2020-01-16 LAB — HEPATITIS B CORE ANTIBODY, IGM: Hep B C IgM: NONREACTIVE

## 2020-01-16 LAB — GLUCOSE 6 PHOSPHATE DEHYDROGENASE: G-6PDH: 20.6 U/g Hgb — ABNORMAL HIGH (ref 7.0–20.5)

## 2020-01-16 LAB — SEDIMENTATION RATE: Sed Rate: 2 mm/h (ref 0–20)

## 2020-01-16 LAB — C3 AND C4
C3 Complement: 114 mg/dL (ref 82–185)
C4 Complement: 20 mg/dL (ref 15–53)

## 2020-01-16 LAB — IFE INTERPRETATION: Immunofix Electr Int: NOT DETECTED

## 2020-01-16 LAB — QUANTIFERON-TB GOLD PLUS
Mitogen-NIL: 2.67 IU/mL
NIL: 0.02 IU/mL
QuantiFERON-TB Gold Plus: NEGATIVE
TB1-NIL: 0 IU/mL
TB2-NIL: 0 IU/mL

## 2020-01-16 LAB — SJOGREN'S SYNDROME ANTIBODS(SSA + SSB)
SSA (Ro) (ENA) Antibody, IgG: <1 AI
SSB (La) (ENA) Antibody, IgG: <1 AI

## 2020-01-16 LAB — ANTI-NUCLEAR AB-TITER (ANA TITER): ANA Titer 1: 1:1280 {titer} — ABNORMAL HIGH

## 2020-01-16 LAB — ANTI-SCLERODERMA ANTIBODY: Scleroderma (Scl-70) (ENA) Antibody, IgG: <1 AI

## 2020-01-16 LAB — TEST AUTHORIZATION

## 2020-01-16 LAB — HIV ANTIBODY (ROUTINE TESTING W REFLEX): HIV 1&2 Ab, 4th Generation: NONREACTIVE

## 2020-01-16 LAB — ANTI-DNA ANTIBODY, DOUBLE-STRANDED: ds DNA Ab: <1 IU/mL

## 2020-01-16 LAB — CYCLIC CITRUL PEPTIDE ANTIBODY, IGG: Cyclic Citrullin Peptide Ab: <16 UNITS

## 2020-01-16 LAB — HEPATITIS B SURFACE ANTIGEN: Hepatitis B Surface Ag: NONREACTIVE

## 2020-01-21 ENCOUNTER — Telehealth: Payer: Self-pay | Admitting: Physician Assistant

## 2020-01-21 NOTE — Telephone Encounter (Signed)
Left message for patient to schedule Annual Wellness Visit.  Please schedule with Nurse Health Advisor Martha Stanley, RN at [facility]  

## 2020-01-25 ENCOUNTER — Other Ambulatory Visit (HOSPITAL_COMMUNITY): Payer: Self-pay | Admitting: Cardiology

## 2020-01-26 ENCOUNTER — Encounter (HOSPITAL_COMMUNITY): Payer: Self-pay | Admitting: Cardiology

## 2020-01-27 DIAGNOSIS — R350 Frequency of micturition: Secondary | ICD-10-CM | POA: Diagnosis not present

## 2020-01-27 DIAGNOSIS — N401 Enlarged prostate with lower urinary tract symptoms: Secondary | ICD-10-CM | POA: Diagnosis not present

## 2020-01-27 DIAGNOSIS — R3915 Urgency of urination: Secondary | ICD-10-CM | POA: Diagnosis not present

## 2020-01-28 ENCOUNTER — Emergency Department (HOSPITAL_COMMUNITY): Payer: Medicare Other

## 2020-01-28 ENCOUNTER — Emergency Department (HOSPITAL_COMMUNITY)
Admission: EM | Admit: 2020-01-28 | Discharge: 2020-01-28 | Disposition: A | Discharge status: Home / Self Care | Payer: Medicare Other | Attending: Emergency Medicine | Admitting: Emergency Medicine

## 2020-01-28 DIAGNOSIS — M19041 Primary osteoarthritis, right hand: Secondary | ICD-10-CM | POA: Diagnosis not present

## 2020-01-28 DIAGNOSIS — W311XXA Contact with metalworking machines, initial encounter: Secondary | ICD-10-CM | POA: Diagnosis not present

## 2020-01-28 DIAGNOSIS — Z20822 Contact with and (suspected) exposure to covid-19: Secondary | ICD-10-CM | POA: Diagnosis not present

## 2020-01-28 DIAGNOSIS — I499 Cardiac arrhythmia, unspecified: Secondary | ICD-10-CM | POA: Diagnosis not present

## 2020-01-28 DIAGNOSIS — S6991XA Unspecified injury of right wrist, hand and finger(s), initial encounter: Secondary | ICD-10-CM | POA: Diagnosis not present

## 2020-01-28 DIAGNOSIS — Z23 Encounter for immunization: Secondary | ICD-10-CM | POA: Diagnosis not present

## 2020-01-28 DIAGNOSIS — S61411A Laceration without foreign body of right hand, initial encounter: Secondary | ICD-10-CM | POA: Diagnosis not present

## 2020-01-28 DIAGNOSIS — R52 Pain, unspecified: Secondary | ICD-10-CM | POA: Diagnosis not present

## 2020-01-28 DIAGNOSIS — R0902 Hypoxemia: Secondary | ICD-10-CM | POA: Diagnosis not present

## 2020-01-28 DIAGNOSIS — I10 Essential (primary) hypertension: Secondary | ICD-10-CM | POA: Diagnosis not present

## 2020-01-28 LAB — RESP PANEL BY RT-PCR (FLU A&B, COVID) ARPGX2
Influenza A by PCR: NEGATIVE
Influenza B by PCR: NEGATIVE
SARS Coronavirus 2 by RT PCR: NEGATIVE

## 2020-01-28 MED ORDER — HYDROMORPHONE HCL 1 MG/ML IJ SOLN
1.0000 mg | Freq: Once | INTRAMUSCULAR | Status: AC
  Administered 2020-01-28: 1 mg via INTRAVENOUS
  Filled 2020-01-28: qty 1

## 2020-01-28 MED ORDER — OXYCODONE HCL 5 MG PO TABS: 5.0000 mg | ORAL_TABLET | Freq: Four times a day (QID) | ORAL | 0 refills | Status: DC | PRN

## 2020-01-28 MED ORDER — MORPHINE SULFATE (PF) 4 MG/ML IV SOLN
4.0000 mg | Freq: Once | INTRAVENOUS | Status: AC
  Administered 2020-01-28: 4 mg via INTRAVENOUS
  Filled 2020-01-28: qty 1

## 2020-01-28 MED ORDER — CEFAZOLIN SODIUM-DEXTROSE 2-4 GM/100ML-% IV SOLN
2.0000 g | Freq: Once | INTRAVENOUS | Status: AC
  Administered 2020-01-28: 2 g via INTRAVENOUS
  Filled 2020-01-28: qty 100

## 2020-01-28 MED ORDER — SENNOSIDES-DOCUSATE SODIUM 8.6-50 MG PO TABS: 1.0000 | ORAL_TABLET | Freq: Every day | ORAL | 0 refills | Status: AC

## 2020-01-28 MED ORDER — DOXYCYCLINE HYCLATE 100 MG PO TABS
100.0000 mg | ORAL_TABLET | Freq: Once | ORAL | Status: AC
  Administered 2020-01-28: 100 mg via ORAL
  Filled 2020-01-28: qty 1

## 2020-01-28 MED ORDER — TETANUS-DIPHTH-ACELL PERTUSSIS 5-2.5-18.5 LF-MCG/0.5 IM SUSY
0.5000 mL | PREFILLED_SYRINGE | Freq: Once | INTRAMUSCULAR | Status: AC
  Administered 2020-01-28: 0.5 mL via INTRAMUSCULAR
  Filled 2020-01-28: qty 0.5

## 2020-01-28 MED ORDER — DOXYCYCLINE HYCLATE 100 MG PO CAPS: 100.0000 mg | ORAL_CAPSULE | Freq: Two times a day (BID) | ORAL | 0 refills | Status: AC

## 2020-01-28 MED ORDER — LIDOCAINE HCL (PF) 1 % IJ SOLN
10.0000 mL | Freq: Once | INTRAMUSCULAR | Status: AC
  Administered 2020-01-28: 10 mL
  Filled 2020-01-28: qty 10

## 2020-01-28 NOTE — ED Provider Notes (Signed)
[facility] EMERGENCY DEPARTMENT Provider Note   CSN: [id number] Arrival date & time: 01/28/20  1515     History Chief Complaint  Patient presents with  . Laceration    Grinder v Hand    Brandon [name] is a 79 y.o. male who is right-handed presenting to emergency department with injury to his right hand.  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  He was wearing a glove at the time and still sustained a laceration to the dorsum of his right hand.  He immediately applied pressure with bandages at home and called EMS.  They report no significant blood loss in the field.  They gave the patient 250 mcg of fentanyl in route to the hospital.  The patient does not recall his last tetanus shot.  HPI     No past medical history on file.  There are no problems to display for this patient.   No family history on file.     Home Medications Prior to Admission medications   Medication Sig Start Date End Date Taking? Authorizing Provider  doxycycline (VIBRAMYCIN) 100 MG capsule Take 1 capsule (100 mg total) by mouth 2 (two) times daily for 7 days. 01/29/20 02/05/20  Wyvonnia Dusky, MD  oxyCODONE (ROXICODONE) 5 MG immediate release tablet Take 1 tablet (5 mg total) by mouth every 6 (six) hours as needed for up to 12 days for severe pain. 01/28/20 02/09/20  Wyvonnia Dusky, MD  senna-docusate (SENOKOT-S) 8.6-50 MG tablet Take 1 tablet by mouth daily for 30 doses. 01/28/20 02/27/20  Wyvonnia Dusky, MD    Allergies    Celebrex [celecoxib]  Review of Systems   Review of Systems  Constitutional: Negative for chills and fever.  Eyes: Negative for photophobia and visual disturbance.  Respiratory: Negative for cough and shortness of breath.   Cardiovascular: Negative for chest pain and palpitations.  Gastrointestinal: Negative for abdominal pain and vomiting.  Genitourinary:  Negative for dysuria and hematuria.  Musculoskeletal: Positive for arthralgias and myalgias.  Skin: Negative for color change and rash.  Neurological: Negative for weakness and numbness.  All other systems reviewed and are negative.   Physical Exam Updated Vital Signs BP (!) 163/80   Pulse 80   Temp 97.8 F (36.6 C) (Oral)   Resp 17   SpO2 96%   Physical Exam Vitals and nursing note reviewed.  Constitutional:      Appearance: He is well-developed and well-nourished.  HENT:     Head: Normocephalic and atraumatic.  Eyes:     Conjunctiva/sclera: Conjunctivae normal.  Cardiovascular:     Rate and Rhythm: Normal rate and regular rhythm.     Heart sounds: No murmur heard.   Pulmonary:     Effort: Pulmonary effort is normal. No respiratory distress.     Breath sounds: Normal breath sounds.  Abdominal:     Palpations: Abdomen is soft.     Tenderness: There is no abdominal tenderness.  Musculoskeletal:        General: No edema.     Cervical back: Neck supple.     Comments: Laceration to dorsum of right hand as shown below with extensor tendon visualized Patient able to actively and passively form fist and fully extend fingers  Skin:    General: Skin is warm and dry.  Neurological:     Mental Status: He  is alert.     Sensory: No sensory deficit.     Motor: No weakness.  Psychiatric:        Mood and Affect: Mood and affect and mood normal.        Behavior: Behavior normal.          ED Results / Procedures / Treatments   Labs (all labs ordered are listed, but only abnormal results are displayed) Labs Reviewed  RESP PANEL BY RT-PCR (FLU A&B, COVID) [id number]    EKG None  Radiology DG Hand 2 View Right  Result Date: 01/28/2020 CLINICAL DATA:  Band saw injury dorsally, possible foreign body EXAM: RIGHT HAND - 2 VIEW COMPARISON:  None. FINDINGS: Mild degenerative changes are noted the first [facility] joint as well as the in the interphalangeal joints. Soft tissue injury  is noted dorsally although no underlying bony injury is seen. No definitive radiopaque foreign body is noted. Degenerative changes of the interphalangeal joints are seen. IMPRESSION: Soft tissue injury without acute bony abnormality foreign body pain Electronically Signed   By: Inez Catalina M.D.   On: 01/28/2020 16:19    Procedures .Marland KitchenLaceration Repair  Date/Time: 01/29/2020 1:06 AM Performed by: Wyvonnia Dusky, MD Authorized by: Wyvonnia Dusky, MD   Consent:    Consent obtained:  Verbal   Consent given by:  Patient   Risks discussed:  Infection, pain and poor cosmetic result Universal protocol:    Procedure explained and questions answered to patient or proxy's satisfaction: yes     Imaging studies available: yes     Required blood products, implants, devices, and special equipment available: yes     Site/side marked: yes     Immediately prior to procedure, a time out was called: yes     Patient identity confirmed:  Verbally with patient Anesthesia:    Anesthesia method:  Local infiltration   Local anesthetic:  Lidocaine 1% w/o epi Laceration details:    Location:  Hand   Hand location:  R hand, dorsum   Length (cm):  4   Depth (mm):  3 Pre-procedure details:    Preparation:  Patient was prepped and draped in usual sterile fashion Exploration:    Limited defect created (wound extended): no     Hemostasis achieved with:  Direct pressure   Imaging obtained: x-ray     Imaging outcome: foreign body not noted     Wound exploration: wound explored through full range of motion     Contaminated: no   Treatment:    Area cleansed with:  Saline   Irrigation volume:  1000 cc Skin repair:    Repair method:  Sutures   Suture size:  4-0   Suture material:  Prolene   Number of sutures:  12 Approximation:    Approximation:  Close Repair type:    Repair type:  Simple Post-procedure details:    Dressing:  Non-adherent dressing   Procedure completion:  Tolerated well, no  immediate complications   (including critical care time)  Medications Ordered in ED Medications  ceFAZolin (ANCEF) IVPB 2g/100 mL premix (0 g Intravenous Stopped 01/28/20 1630)  Tdap (BOOSTRIX) injection 0.5 mL (0.5 mLs Intramuscular Given 01/28/20 1544)  morphine 4 MG/ML injection 4 mg (4 mg Intravenous Given 01/28/20 1543)  lidocaine (PF) (XYLOCAINE) 1 % injection 10 mL (10 mLs Infiltration Given by Other 01/28/20 1728)  HYDROmorphone (DILAUDID) injection 1 mg (1 mg Intravenous Given 01/28/20 1730)  doxycycline (VIBRA-TABS) tablet 100 mg (100 mg Oral Given  01/28/20 1900)    ED Course  I have reviewed the triage vital signs and the nursing notes.  Pertinent labs & imaging results that were available during my care of the patient were reviewed by me and considered in my medical decision making (see chart for details).  79 year old male right handed here with right hand injury as shown above Xrays pending  Wound irrigated with copious normal saline He is neurovascularly intact, bleeding controlled, does not appear to have arterial injury Will update tetanus, give 2g IV ancef as infection ppx, f/u on xrays, and discuss closure options with hand surgery  Clinical Course as of 01/29/20 0108  Thu Jan 28, 2020  1713 Awaiting callback from hand surgeon [MT]  1730 Discussed case with Dr Fredna Dow who agreed with primary closure of wound site, initiation of doxycycline as infection prevention ppx, and they will arrange to see him in the office next week.   [MT]    Clinical Course User Index [MT] Langston Masker Carola Rhine, MD    Final Clinical Impression(s) / ED Diagnoses Final diagnoses:  Laceration of right hand without foreign body, initial encounter    Rx / DC Orders ED Discharge Orders         Ordered    doxycycline (VIBRAMYCIN) 100 MG capsule  2 times daily        01/28/20 1958    oxyCODONE (ROXICODONE) 5 MG immediate release tablet  Every 6 hours PRN        01/28/20 1958     senna-docusate (SENOKOT-S) 8.6-50 MG tablet  Daily        01/28/20 1958           Wyvonnia Dusky, MD 01/29/20 [phone]

## 2020-01-28 NOTE — Progress Notes (Signed)
Orthopedic Tech Progress Note Patient Details:  Brandon [name] September 12, 1940 [id number] Level 2 trauma Patient ID: Brandon [name], male   DOB: 08-Jul-1940, 79 y.o.   MRN: [id number]   Brandon [name] 01/28/2020, 3:47 PM

## 2020-01-28 NOTE — ED Notes (Signed)
Hand wound cleaned with NS; wrapped with Telfa pad, 4x4 gauze and Kerlix.

## 2020-01-28 NOTE — ED Triage Notes (Signed)
Pt presents w/ c/o deep laceration to right hand. 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   On arrival, bleeding controlled. LEVEL 2 trauma initiated.

## 2020-01-28 NOTE — Discharge Instructions (Signed)
Keep your wound dry and covered for 48 hours.  Afterwards you can take off your dressing and gently wash your hands with soap and water.  Don't rub at your stitches and don't put bacitracin or ointments or peroxide on your wound.  Call Dr Levell July office tomorrow (or Monday if they're closed) and ask for the next available appointment.  They can see you next week in the office.  He should look at your wound again to make sure it is healing right.  You have 12 stitches in your hand that need to be removed in 7-10 days.  This can happen in the orthopedic office, or an urgent care or doctor's office.  *  I prescribed extra oxicodone for the next 3 days.  You can increase your home dose to 10 mg every 6 hours as needed for pain.  You should take this with a stool softener to prevent constipation.

## 2020-02-01 ENCOUNTER — Encounter: Payer: Self-pay | Admitting: Rheumatology

## 2020-02-01 NOTE — Progress Notes (Signed)
Office Visit Note  Patient: Brandon [name]             Date of Birth: 1940-05-02           MRN: [id number]             PCP: Delorse Limber Referring: Brunetta Jeans, PA-C Visit Date: 02/10/2020 Occupation: _0 @  Subjective:  Follow-up (Prednisone helps, under pain management)   History of Present Illness: Brandon [name] is a 79 y.o. male with history of polymyalgia rheumatica.  He has been on prednisone 15 mg p.o.daily for 1 month and then he decreased to prednisone 10 mg p.o. daily for the last 2 weeks.  He continues to do well without any increased muscle pain or weakness.  He states he injured his right hand a day before Christmas and has a deep cut.  He has been seeing Dr. Fredna Dow and had stitches.  He denies having any infection.  He will have a follow-up with Dr.Kuzma.  Activities of Daily Living:  Patient reports morning stiffness for 0 none.   Patient Denies nocturnal pain.  Difficulty dressing/grooming: Denies Difficulty climbing stairs: Denies Difficulty getting out of chair: Denies Difficulty using hands for taps, buttons, cutlery, and/or writing: Denies  Review of Systems  Constitutional: Negative for fatigue.  HENT: Positive for mouth dryness.   Eyes: Positive for dryness.  Respiratory: Negative for shortness of breath.   Cardiovascular: Negative for swelling in legs/feet.  Gastrointestinal: Negative for constipation.  Endocrine: Negative for excessive thirst.  Genitourinary: Negative for difficulty urinating.  Musculoskeletal: Positive for arthralgias and joint pain.  Skin: Negative for rash.  Allergic/Immunologic: Negative for susceptible to infections.  Neurological: Positive for numbness.  Hematological: Positive for bruising/bleeding tendency.  Psychiatric/Behavioral: Negative for sleep disturbance.    PMFS History:  Patient Active Problem List   Diagnosis Date Noted  . Primary osteoarthritis of both hands 01/11/2020  . Piriformis syndrome  of both sides 09/23/2019  . Pain of both shoulder joints 07/29/2019  . Chronic right hip pain 07/29/2019  . Sciatica associated with disorder of lumbar spine 04/24/2019  . Primary osteoarthritis involving multiple joints 04/24/2019  . Chronic pain syndrome 04/24/2019  . Ankyloglossia 06/23/2018  . Encounter for chronic pain management 04/15/2018  . Anxiety and depression 04/15/2018  . 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 Cancer of tonsillar fossa ([place]) 09/20/2017  . Bruising 10/02/2016  . 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 Aneurysm of infrarenal abdominal aorta (HCC) 07/10/2016  . Normocytic anemia 07/10/2016  . Tobacco abuse disorder 01/10/2014  . Seasonal allergies 11/23/2013  . Essential hypertension 05/19/2013  . Osteoarthritis 05/29/2009  . 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 Aortic valve disorder 01/28/2008  . BRAIN STEM STROKE 08/27/2007  . 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 BENIGN PROSTATIC HYPERTROPHY, HX OF 08/27/2007  . Hyperlipidemia 03/19/2007  . Anxiety state 03/19/2007  . GERD 03/19/2007    Past Medical History:  Diagnosis Date  . Aortic stenosis    mild echocardiogram 8/09 EF 60%, showed no regional wall motion abnml, mild LVH, mod focal basal septal hypertrophy and mild dyastolic dysfunction. 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 mean gradient across aortic valve was 8 mmHG. also mild L atrial enlargement and normal RV size and function. minimal AS on LHC in 7/10.   . Arthritis    "all over" (07/19/2016)  . BPH (benign prostatic hypertrophy)   . CAD (coronary artery disease)    [place] (7/10) totally  occluded proximal RCA with very robuse L to R collaterals, 50% proximal LAD stenosis, EF 65%, medical management.    . Chicken pox   . Chronic lower back pain    s/p surgical fusion  . Diverticulosis   . Esophageal stricture   . GERD (gastroesophageal reflux disease)   . Heart murmur   . Hepatitis B 1984  . Hiatal hernia   . History  of radiation therapy 01/16/18- 03/05/18   Left Tonsil, 66 Gy in 33 fractions to high risk nodal echelons.   Marland Kitchen HLD (hyperlipidemia)   . HTN (hypertension)   . 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 Osteoarthritis   . TIA (transient ischemic attack) 1990s   hx  . tonsillar ca dx'd 11/2017  . Tubular adenoma of colon 2009    Family History  Problem Relation Age of Onset  . Heart disease Father 55       Living  . Coronary artery disease Father        CABG  . Alzheimer's disease Mother 21       Deceased  . Arthritis Mother   . Aneurysm Brother   . Stomach cancer Maternal Uncle   . Brain cancer Maternal Aunt        x2  . Obesity Daughter        Had Bypass Sx   Past Surgical History:  Procedure Laterality Date  . BACK SURGERY    . CATARACT EXTRACTION W/ INTRAOCULAR LENS  IMPLANT, BILATERAL Bilateral 01/2012 - 02/2012  . DIRECT LARYNGOSCOPY Left 10/09/2017   Procedure: DIRECT LARYNGOSCOPY WITH BOPSY;  Surgeon: Jodi Marble, MD;  Location: [facility];  Service: ENT;  Laterality: Left;  . ESOPHAGOGASTRODUODENOSCOPY (EGD) WITH ESOPHAGEAL DILATION     "couple times" (07/19/2016)  . ESOPHAGOSCOPY Left 10/09/2017   Procedure: ESOPHAGOSCOPY;  Surgeon: Jodi Marble, MD;  Location: [facility];  Service: ENT;  Laterality: Left;  . IR GASTROSTOMY TUBE MOD SED  01/08/2018  . IR THORACENTESIS ASP PLEURAL SPACE W/IMG GUIDE  07/19/2016  . LAPAROSCOPIC CHOLECYSTECTOMY  1994  . LUMBAR DISC SURGERY  05/1996   L4-5; Dr. Sherwood Gambler   . LUMBAR LAMINECTOMY/DECOMPRESSION MICRODISCECTOMY  10/2002   L3-4. Dr. Sherwood Gambler  . MULTIPLE TOOTH EXTRACTIONS  1980s  . PARTIAL GLOSSECTOMY  12/02/2017   Dr. Nicolette Bang- [facility]  . pharyngoplasty for closure of tingue base defect  12/02/2017   Dr. Nicolette Bang- [facility]  . PICC LINE INSERTION  07/15/2016  . POSTERIOR LUMBAR FUSION  09/1996   Ray cage, L4-5 Dr. Rita Ohara  . PROSTATE BIOPSY  ~ 2017  . radical tonsillectomy Left 12/02/2017   [facility]ico[facility]   . RIGID BRONCHOSCOPY Left 10/09/2017   Procedure: RIGID BRONCHOSCOPY;  Surgeon: Jodi Marble, MD;  Location: [facility];  Service: ENT;  Laterality: Left;  . TRACHEOSTOMY  12/02/2017   Dr. Nicolette Bang- [facility]   Social History   Social History Narrative   ** Merged History Encounter **       Married (3rd), [place]. 2 children from 1st marriage, 4 step children.    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       Cell # [id number]   Immunization History  Administered Date(s) Administered  . Fluad Quad(high Dose 65+) 10/15/2018, 11/06/2019  . Influenza Split 11/26/2010, 10/11/2011, 11/03/2012  . Influenza Whole 11/14/2006, 12/04/2007, 10/06/2008, 10/25/2009  . Influenza, High Dose Seasonal PF 11/01/2016,  10/30/2017  . Influenza, Seasonal, Injecte, Preservative Fre 12/07/2014  . Influenza,inj,Quad PF,6+ Mos 11/04/2015  . Influenza-Unspecified 11/19/2013  . PFIZER SARS-COV-2 Vaccination 02/25/2019, 03/18/2019, 11/16/2019  . Pneumococcal Conjugate-13 12/07/2014  . Pneumococcal Polysaccharide-23 05/30/2017  . Tdap 01/28/2020     Objective: Vital Signs: BP (!) 128/55 (BP Location: Left Arm, Patient Position: Sitting, Cuff Size: Normal)   Pulse 63   Resp 16   Ht 5' 8" (1.727 m)   Wt 172 lb (78 kg)   BMI 26.15 kg/m    Physical Exam Vitals and nursing note reviewed.  Constitutional:      Appearance: He is well-developed and well-nourished.  HENT:     Head: Normocephalic and atraumatic.  Eyes:     Extraocular Movements: EOM normal.     Conjunctiva/sclera: Conjunctivae normal.     Pupils: Pupils are equal, round, and reactive to light.  Cardiovascular:     Rate and Rhythm: Normal rate and regular rhythm.     Heart sounds: Normal heart sounds.  Pulmonary:     Effort: Pulmonary effort is normal.     Breath sounds: Normal breath sounds.  Abdominal:     General: Bowel sounds are normal.      Palpations: Abdomen is soft.  Musculoskeletal:     Cervical back: Normal range of motion and neck supple.  Skin:    General: Skin is warm and dry.     Capillary Refill: Capillary refill takes less than 2 seconds.  Neurological:     Mental Status: He is alert and oriented to person, place, and time.  Psychiatric:        Mood and Affect: Mood and affect normal.        Behavior: Behavior normal.      Musculoskeletal Exam: Limited range of motion of cervical lumbar spine.  Shoulder joints and elbow joints with good range of motion.  His right hand is wrapped due to recent injury.  PIP and DIP thickening with no synovitis was noted.  Hip joints and knee joints are in good range of motion.  No tenderness over ankle joints or MTPs was noted.  CDAI Exam: CDAI Score: -- Patient Global: --; Provider Global: -- Swollen: --; Tender: -- Joint Exam 02/10/2020   No joint exam has been documented for this visit   There is currently no information documented on the homunculus. Go to the Rheumatology activity and complete the homunculus joint exam.  Investigation: No additional findings.  Imaging: DG Hand 2 View Right  Result Date: 01/28/2020 CLINICAL DATA:  Band saw injury dorsally, possible foreign body EXAM: RIGHT HAND - 2 VIEW COMPARISON:  None. FINDINGS: Mild degenerative changes are noted the first [facility] joint as well as the in the interphalangeal joints. Soft tissue injury is noted dorsally although no underlying bony injury is seen. No definitive radiopaque foreign body is noted. Degenerative changes of the interphalangeal joints are seen. IMPRESSION: Soft tissue injury without acute bony abnormality foreign body pain Electronically Signed   By: Inez Catalina M.D.   On: 01/28/2020 16:19    Recent Labs: Lab Results  Component Value Date   WBC 10.2 01/11/2020   HGB 12.2 (L) 01/11/2020   PLT 163 01/11/2020   NA 139 01/11/2020   K 3.6 01/11/2020   CL 99 01/11/2020   CO2 28 01/11/2020    GLUCOSE 108 01/11/2020   BUN 17 01/11/2020   CREATININE 0.64 (L) 01/11/2020   BILITOT 0.5 01/11/2020   ALKPHOS 180 (H) 09/29/2019  AST 10 01/11/2020   ALT 6 (L) 01/11/2020   PROT 6.5 01/11/2020   PROT 6.4 01/11/2020   ALBUMIN 3.7 09/29/2019   CALCIUM 9.0 01/11/2020   GFRAA 109 01/11/2020   QFTBGOLDPLUS NEGATIVE 01/11/2020   07/2019 SPEP consistent with inflammatory pattern, IFE negative, TB Gold negative, immunoglobulins normal, hepatitis B-, hepatitis C negative, HIV negative, G6PD 20.6 ANA 1: 1280 cytoplasmic, dsDNA negative, SCL 70 -, SSA antibody negative, SSB antibody negative, C3-C4 normal, RF negative, anti-CCP negative, ESR 2  Speciality Comments: No specialty comments available.  Procedures:  No procedures performed Allergies: Celebrex [celecoxib] and Celebrex [celecoxib]   Assessment / Plan:     Visit Diagnoses: Polymyalgia rheumatica ([place]) - History of muscle weakness and tenderness, elevated sed rate.  Good response to prednisone.  He is currently on prednisone 10 mg p.o. daily for the last 2 weeks and continues to do well.  The plan was to add methotrexate at this visit.  He had an injury to his right hand and had stitches.  I will hold off methotrexate for right now.  We will discuss this at the follow-up visit.  We discussed tapering prednisone by 1 mg every month.  If he has flare of symptoms he should notify us.  Detailed instructions were placed in the AVS and discussed with the patient.  High risk medication use - Prednisone 10 mg p.o. daily.  We discussed use of methotrexate at the last visit.  I will hold off methotrexate until the follow-up visit.  All the labs obtained at the last visit were discussed.  Long term (current) use of systemic steroids-side effects of long-term use of steroids were reviewed.  I would also consider obtaining DEXA scan at the follow-up visit.  Laceration of right hand, foreign body presence unspecified, sequela-he has been followed by  Dr. Fredna Dow and had the stitches to the right hand.  Psoriasis - Patient developed psoriasis 15 years ago which lasted for 1 year without any recurrence.  DDD (degenerative disc disease), lumbar - Status post fusion by Dr. Sherwood Gambler.  Primary osteoarthritis of both hands - Followed by Dr. Berenice Primas.  He has positive ANA but no clinical features of autoimmune disease.  ENA panel was negative.  Chronic pain syndrome-followed in pain management.  Liver abscess - In 2018 due to diverticulitis.  Treated with IV antibiotics over 3 weeks.  History of diverticulitis - Followed by Dr. Fuller Plan.  History of gastroesophageal reflux (GERD)  Oropharyngeal dysphagia - Secondary to radiation therapy per patient.  Cancer of tonsillar fossa ([place]) - Treated with surgery and radiation therapy in 2019.  Essential hypertension  Aneurysm of infrarenal abdominal aorta (HCC)  Anxiety and depression  History of hyperlipidemia  Tobacco abuse disorder - one pack per day for 50 years.  Orders: No orders of the defined types were placed in this encounter.  Meds ordered this encounter  Medications  . predniSONE (DELTASONE) 1 MG tablet    Sig: Take 4 tablets (4 mg total) by mouth daily with breakfast. Take with one 5 mg tablet to equal 9 mg daily.    Dispense:  120 tablet    Refill:  0  . predniSONE (DELTASONE) 5 MG tablet    Sig: Take 2 tablets (10 mg total) by mouth daily with breakfast. Until 03/12/2020. Then taper by 1 mg monthly.    Dispense:  60 tablet    Refill:  0    Follow-Up Instructions: Return in about 3 months (around 05/10/2020) for Polymyalgia rheumatica.  Bo Merino, MD  Note - This record has been created using Editor, commissioning.  Chart creation errors have been sought, but may not always  have been located. Such creation errors do not reflect on  the standard of medical care.

## 2020-02-03 DIAGNOSIS — S61411A Laceration without foreign body of right hand, initial encounter: Secondary | ICD-10-CM | POA: Diagnosis not present

## 2020-02-04 ENCOUNTER — Telehealth: Payer: Self-pay | Admitting: *Deleted

## 2020-02-04 NOTE — Telephone Encounter (Signed)
Mr Conard called for a refill on his oxycodone.  Per the PMP his last refill from Dr Berline Chough was 01/08/20 #120, but he received #12 on 01/29/20 from and ED visit for a laceration.

## 2020-02-05 ENCOUNTER — Other Ambulatory Visit (HOSPITAL_COMMUNITY): Payer: Self-pay | Admitting: Cardiology

## 2020-02-08 ENCOUNTER — Other Ambulatory Visit: Payer: Self-pay

## 2020-02-08 ENCOUNTER — Encounter
Payer: Medicare Other | Attending: Physical Medicine and Rehabilitation | Admitting: Physical Medicine and Rehabilitation

## 2020-02-08 ENCOUNTER — Encounter: Payer: Self-pay | Admitting: Physical Medicine and Rehabilitation

## 2020-02-08 VITALS — BP 141/70 | HR 71 | Temp 97.8°F | Ht 66.25 in | Wt 170.0 lb

## 2020-02-08 DIAGNOSIS — F329 Major depressive disorder, single episode, unspecified: Secondary | ICD-10-CM | POA: Diagnosis not present

## 2020-02-08 DIAGNOSIS — M25551 Pain in right hip: Secondary | ICD-10-CM | POA: Diagnosis not present

## 2020-02-08 DIAGNOSIS — M353 Polymyalgia rheumatica: Secondary | ICD-10-CM | POA: Diagnosis not present

## 2020-02-08 DIAGNOSIS — G5703 Lesion of sciatic nerve, bilateral lower limbs: Secondary | ICD-10-CM

## 2020-02-08 DIAGNOSIS — G894 Chronic pain syndrome: Secondary | ICD-10-CM | POA: Diagnosis not present

## 2020-02-08 DIAGNOSIS — G8929 Other chronic pain: Secondary | ICD-10-CM

## 2020-02-08 MED ORDER — GABAPENTIN 300 MG PO CAPS: ORAL_CAPSULE | ORAL | 5 refills | Status: DC

## 2020-02-08 MED ORDER — CITALOPRAM HYDROBROMIDE 20 MG PO TABS: 20.0000 mg | ORAL_TABLET | Freq: Every day | ORAL | 5 refills | Status: DC

## 2020-02-08 MED ORDER — TRAMADOL HCL 50 MG PO TABS: 100.0000 mg | ORAL_TABLET | Freq: Two times a day (BID) | ORAL | 5 refills | Status: DC

## 2020-02-08 MED ORDER — OXYCODONE HCL 5 MG PO TABS: 5.0000 mg | ORAL_TABLET | Freq: Four times a day (QID) | ORAL | 0 refills | Status: DC | PRN

## 2020-02-08 NOTE — Telephone Encounter (Signed)
Notified.  He has an appt today with Dr Berline Chough.

## 2020-02-08 NOTE — Telephone Encounter (Signed)
Refilled his Oxycodone 5 mg #120- last refill from [place] 12/3- and did get 12 tablets from ER for laceration.

## 2020-02-08 NOTE — Progress Notes (Signed)
Subjective:    Patient ID: Brandon [name], male    DOB: 22-Oct-1940, 80 y.o.   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   Has new laceration of R hand form working in shop 2 weeks ago.    Trying to curb the amount of Oxycodone, since on Prednisone.     If idle, and don't move legs, feels like legs HAVE to move.  And R foot is completely numb when wakes out of sleep.     Gets a rush of restless leg syndrome.   Ran out of gabapentin a few days ago- just got new tramadol for restless leg syndrome.   Buttock pain been better than muscle pain.  Muscle pain MUCH better on prednisone- 10 mg Prednisone daily right now.   Has been coming up with depression in last few weeks- esp this week since hurt hand.   Oxycodone not giving him feeling of Comfort- that usually does- thinks getting more depressed, thinks due to prednisone.   Felt pressure not pain in chest when pulling trash can to road- has appointment with Cards early February.  Also has AAA as well- and murmur-    Pain Inventory Average Pain 7 Pain Right Now 3 My pain is intermittent and aching  In the last 24 hours, has pain interfered with the following? General activity 6 Relation with others 0 Enjoyment of life 0 What TIME of day is your pain at its worst? morning  Sleep (in general) Fair  Pain is worse with: walking Pain improves with: medication and exercise Relief from Meds: good            Family History  Problem Relation Age of Onset  . Heart disease Father 65       Living  . Coronary artery disease Father        CABG  . Alzheimer's disease Mother 52       Deceased  . Arthritis Mother   .  Aneurysm Brother   . Stomach cancer Maternal Uncle   . Brain cancer Maternal Aunt        x2  . Obesity Daughter        Had Bypass Sx   Social History   Socioeconomic History  . Marital status: Married    Spouse name: etta  . Number of children: 2  . Years of education: Not on file  . Highest education level: Not on file  Occupational History  . Occupation: retired    Fish farm manager: RETIRED    Comment: disabled due to back problems  Tobacco Use  . Smoking status: Former Smoker    Packs/day: 1.50    Years: 50.00    Pack years: 75.00    Types: Cigarettes    Quit date: 09/05/2017    Years since quitting: 2.4  . Smokeless tobacco: Never Used  . Tobacco comment: smoked less than 1 ppd for 40+ years; Using Vapor Cig  Vaping Use  . Vaping Use: Never used  Substance and Sexual Activity  . Alcohol use: Not Currently    Alcohol/week: 0.0 standard drinks    Comment: 07/19/2016 "might have a few  drinks/year"  . Drug use: No  . Sexual activity: Never  Other Topics Concern  . Not on file  Social History Narrative   ** Merged History Encounter **       Married (3rd), [place]. 2 children from 1st marriage, 4 step children.    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       Cell # [phone]   Social Determinants of Health   Financial Resource Strain: Not on file  Food Insecurity: No Food Insecurity  . Worried About Charity fundraiser in the Last Year: Never true  . Ran Out of Food in the Last Year: Never true  Transportation Needs: No Transportation Needs  . Lack of Transportation (Medical): No  . Lack of Transportation (Non-Medical): No  Physical Activity: Not on file  Stress: Not on file  Social Connections: Not on file   Past Surgical History:  Procedure Laterality Date  . BACK SURGERY    . CATARACT EXTRACTION W/ INTRAOCULAR LENS  IMPLANT, BILATERAL Bilateral 01/2012 - 02/2012  . DIRECT  LARYNGOSCOPY Left 10/09/2017   Procedure: DIRECT LARYNGOSCOPY WITH BOPSY;  Surgeon: Jodi Marble, MD;  Location: [place];  Service: ENT;  Laterality: Left;  . ESOPHAGOGASTRODUODENOSCOPY (EGD) WITH ESOPHAGEAL DILATION     "couple times" (07/19/2016)  . ESOPHAGOSCOPY Left 10/09/2017   Procedure: ESOPHAGOSCOPY;  Surgeon: Jodi Marble, MD;  Location: [place];  Service: ENT;  Laterality: Left;  . IR GASTROSTOMY TUBE MOD SED  01/08/2018  . IR THORACENTESIS ASP PLEURAL SPACE W/IMG GUIDE  07/19/2016  . LAPAROSCOPIC CHOLECYSTECTOMY  1994  . LUMBAR DISC SURGERY  05/1996   L4-5; Dr. Sherwood Gambler   . LUMBAR LAMINECTOMY/DECOMPRESSION MICRODISCECTOMY  10/2002   L3-4. Dr. Sherwood Gambler  . MULTIPLE TOOTH EXTRACTIONS  1980s  . PARTIAL GLOSSECTOMY  12/02/2017   Dr. Nicolette Bang- [facility]  . pharyngoplasty for closure of tingue base defect  12/02/2017   Dr. Nicolette Bang- [facility]  . PICC LINE INSERTION  07/15/2016  . POSTERIOR LUMBAR FUSION  09/1996   Ray cage, L4-5 Dr. Rita Ohara  . PROSTATE BIOPSY  ~ 2017  . radical tonsillectomy Left 12/02/2017   Dr. [facility]ico[facility] at [facility]  . RIGID BRONCHOSCOPY Left 10/09/2017   Procedure: RIGID BRONCHOSCOPY;  Surgeon: Jodi Marble, MD;  Location: [place];  Service: ENT;  Laterality: Left;  . [facility]02[facility]   Past Medical History:  Diagnosis Date  . Aortic stenosis    mild echocardiogram 8/09 EF 60%, showed no regional wall motion abnml, mild LVH, mod focal basal septal hypertrophy and mild dyastolic dysfunction. 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 mean gradient across aortic valve was 8 mmHG. also mild L atrial enlargement and normal RV size and function. minimal AS on LHC in 7/10.   . Arthritis    "all over" (07/19/2016)  . BPH (benign prostatic hypertrophy)   . CAD (coronary artery disease)    [facility]CH (7/10) totally occluded proximal RCA with very robuse L to R  collaterals, 50% proximal LAD stenosis, EF 65%, medical management.    . Chicken pox   . Chronic lower back pain    s/p surgical fusion  . Diverticulosis   . Esophageal stricture   . GERD (gastroesophageal reflux disease)   . Heart murmur   . Hepatitis B 1984  .  Hiatal hernia   . History of radiation therapy 01/16/18- 03/05/18   Left Tonsil, 66 Gy in 33 fractions to high risk nodal echelons.   Marland Kitchen HLD (hyperlipidemia)   . HTN (hypertension)   . 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 Osteoarthritis   . TIA (transient ischemic attack) 1990s   hx  . tonsillar ca dx'd 11/2017  . Tubular adenoma of colon 2009   There were no vitals taken for this visit.  Opioid Risk Score:   Fall Risk Score:  `1  Depression screen PHQ 2/9  Depression screen [facility] 2/9 09/29/2019 09/23/2019 07/29/2019 05/27/2019 04/24/2019 04/20/2019 09/12/2018  Decreased Interest 0 0 1 1 1 1 1   Down, Depressed, Hopeless 0 0 0 0 0 1 1  PHQ - 2 Score 0 0 1 1 1 2 2   Altered sleeping 0 - - - 1 0 0  Tired, decreased energy 1 - - - 1 0 0  Change in appetite 0 - - - 0 0 0  Feeling bad or failure about yourself  0 - - - 1 0 0  Trouble concentrating 0 - - - 0 0 0  Moving slowly or fidgety/restless 0 - - - 0 0 0  Suicidal thoughts 0 - - - 0 0 0  PHQ-9 Score 1 - - - 4 2 2   Difficult doing work/chores - - - - - - Somewhat difficult  Some recent data might be hidden   Review of Systems  Musculoskeletal: Positive for back pain and gait problem.  All other systems reviewed and are negative.      Objective:   Physical Exam  Awake, alert, appropriate, R hand dressed in dressing C/D/I Can peek and is jagged with sutures in it.  Depressed affect, which is new.  Can tolerate light touch- not jumping through roof with soft touch, like before.       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    1. Chronic pain- Needs to be SLOW to reduce Oxycodone- 1/2 tablet at a time. No faster than 1-2 weeks before next reduction in dose. If cannot, it's fine.   2. Chronic pain/restless leg syndrome-  Continue Gabapentin 300 mg TID and tramadol 100 mg BID for restless leg syndrome.  Will refill Gabapentin AND tramadol today.  3. R hand laceration-  Sutures to come out tomorrow for R hand  4. New onset depression- Celexa/Citalopram - 20 mg daily- will start  For mood/depression- likely due to Prednisone- mood takes ~ 1-2 weeks to get better; energy level will get in 2-7 days.   5. Scar management- Vit E- capsules- pierce capsule and apply liquid to hand after sutures removed 1x/day to improve ROM and look of scar.   6. F/U - in 6-8 weeks.    I spent a total of 30 minutes on visit- as detailed above.

## 2020-02-08 NOTE — Patient Instructions (Signed)
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    1. Chronic pain- Needs to be SLOW to reduce Oxycodone- 1/2 tablet at a time. No faster than 1-2 weeks before next reduction in dose. If cannot, it's fine.   2. Chronic pain/restless leg syndrome-  Continue Gabapentin 300 mg TID and tramadol 100 mg BID for restless leg syndrome.   3. R hand laceration-  Sutures to come out tomorrow for R hand  4. New onset depression- Celexa/Citalopram - 20 mg daily- will start  For mood/depression- likely due to Prednisone- mood takes ~ 1-2 weeks to get better; energy level will get in 2-7 days.   5. Scar management- Vit E- capsules- pierce capsule and apply liquid to hand after sutures removed 1x/day to improve ROM and look of scar.   6. F/U - in 6-8 weeks.

## 2020-02-09 DIAGNOSIS — S61411A Laceration without foreign body of right hand, initial encounter: Secondary | ICD-10-CM | POA: Diagnosis not present

## 2020-02-10 ENCOUNTER — Other Ambulatory Visit: Payer: Self-pay

## 2020-02-10 ENCOUNTER — Encounter: Payer: Self-pay | Admitting: Rheumatology

## 2020-02-10 ENCOUNTER — Ambulatory Visit (INDEPENDENT_AMBULATORY_CARE_PROVIDER_SITE_OTHER): Payer: Medicare Other | Admitting: Rheumatology

## 2020-02-10 VITALS — BP 128/55 | HR 63 | Resp 16 | Ht 68.0 in | Wt 172.0 lb

## 2020-02-10 DIAGNOSIS — Z7952 Long term (current) use of systemic steroids: Secondary | ICD-10-CM

## 2020-02-10 DIAGNOSIS — M5136 Other intervertebral disc degeneration, lumbar region: Secondary | ICD-10-CM

## 2020-02-10 DIAGNOSIS — S61411S Laceration without foreign body of right hand, sequela: Secondary | ICD-10-CM

## 2020-02-10 DIAGNOSIS — M353 Polymyalgia rheumatica: Secondary | ICD-10-CM

## 2020-02-10 DIAGNOSIS — Z8639 Personal history of other endocrine, nutritional and metabolic disease: Secondary | ICD-10-CM

## 2020-02-10 DIAGNOSIS — Z79899 Other long term (current) drug therapy: Secondary | ICD-10-CM | POA: Diagnosis not present

## 2020-02-10 DIAGNOSIS — Z8719 Personal history of other diseases of the digestive system: Secondary | ICD-10-CM

## 2020-02-10 DIAGNOSIS — Z720 Tobacco use: Secondary | ICD-10-CM

## 2020-02-10 DIAGNOSIS — G894 Chronic pain syndrome: Secondary | ICD-10-CM

## 2020-02-10 DIAGNOSIS — K750 Abscess of liver: Secondary | ICD-10-CM

## 2020-02-10 DIAGNOSIS — M19041 Primary osteoarthritis, right hand: Secondary | ICD-10-CM

## 2020-02-10 DIAGNOSIS — I7143 Infrarenal abdominal aortic aneurysm, without rupture: Secondary | ICD-10-CM

## 2020-02-10 DIAGNOSIS — L409 Psoriasis, unspecified: Secondary | ICD-10-CM

## 2020-02-10 DIAGNOSIS — M19042 Primary osteoarthritis, left hand: Secondary | ICD-10-CM

## 2020-02-10 DIAGNOSIS — F419 Anxiety disorder, unspecified: Secondary | ICD-10-CM

## 2020-02-10 DIAGNOSIS — I10 Essential (primary) hypertension: Secondary | ICD-10-CM

## 2020-02-10 DIAGNOSIS — I714 Abdominal aortic aneurysm, without rupture: Secondary | ICD-10-CM

## 2020-02-10 DIAGNOSIS — F32A Depression, unspecified: Secondary | ICD-10-CM

## 2020-02-10 DIAGNOSIS — R1312 Dysphagia, oropharyngeal phase: Secondary | ICD-10-CM

## 2020-02-10 DIAGNOSIS — C090 Malignant neoplasm of tonsillar fossa: Secondary | ICD-10-CM

## 2020-02-10 MED ORDER — PREDNISONE 1 MG PO TABS: 4.0000 mg | ORAL_TABLET | Freq: Every day | ORAL | 0 refills | Status: DC

## 2020-02-10 MED ORDER — PREDNISONE 5 MG PO TABS: 10.0000 mg | ORAL_TABLET | Freq: Every day | ORAL | 0 refills | Status: DC

## 2020-02-10 NOTE — Patient Instructions (Addendum)
Stay on prednisone 10 mg (2 tablets) daily for 1 month 02/10/2020-03/12/2020 then decrease prednisone to 9 mg daily for 1 month 03/12/2020-04/09/2020 Prednisone 8 mg daily for 1 month 04/09/2020-05/10/2020 Prednisone 7 mg daily for 1 month 05/10/2020-06/09/2020 Prednisone 6 mg daily for 1 month 06/09/2020-07/10/2020 Prednisone 5 mg daily for 1 month stay on this dose until further discussion. 07/10/2020 until further notice.

## 2020-02-11 NOTE — Progress Notes (Signed)
Subjective:   Brandon [name] is a 80 y.o. male who presents for Medicare Annual/Subsequent preventive examination.  I connected with Arvo  today by telephone and verified that I am speaking with the correct person using two identifiers. Location patient: home Location provider: work Persons participating in the virtual visit: patient, Marine scientist.    I discussed the limitations, risks, security and privacy concerns of performing an evaluation and management service by telephone and the availability of in person appointments. I also discussed with the patient that there may be a patient responsible charge related to this service. The patient expressed understanding and verbally consented to this telephonic visit.    Interactive audio and video telecommunications were attempted between this provider and patient, however failed, due to patient having technical difficulties OR patient did not have access to video capability.  We continued and completed visit with audio only.  Some vital signs may be absent or patient reported.   Time Spent with patient on telephone encounter: 25 minutes  Review of Systems     Cardiac Risk Factors include: advanced age (>29men, >42 women);diabetes mellitus;dyslipidemia;hypertension;sedentary lifestyle     Objective:    Today's Vitals   02/15/20 1306  Weight: 172 lb (78 kg)  Height: 5\' 8"  (1.727 m)  PainSc: 5    Body mass index is 26.15 kg/m.  Advanced Directives 02/15/2020 06/26/2019 11/25/2018 03/19/2018 01/08/2018 12/31/2017 12/21/2017  Does Patient Have a Medical Advance Directive? Yes Yes Yes Yes Yes Yes Yes  Type of Advance Directive Living will [place];Living will [place];Living will [place];Living will [place];Living will [place];Living will Living will  Does patient want to make changes to medical advance directive? - No - Patient declined - - - -  No - Patient declined  Copy of [place] in Chart? - Yes - validated most recent copy scanned in chart (See row information) Yes - validated most recent copy scanned in chart (See row information) Yes - validated most recent copy scanned in chart (See row information) Yes - validated most recent copy scanned in chart (See row information) Yes - validated most recent copy scanned in chart (See row information) -  Would patient like information on creating a medical advance directive? - - No - Patient declined No - Patient declined No - Patient declined No - Patient declined -    Current Medications (verified) Outpatient Encounter Medications as of 02/15/2020  Medication Sig  . amLODipine (NORVASC) 10 MG tablet Take 1 tablet (10 mg total) by mouth daily.  Marland Kitchen aspirin 81 MG EC tablet Take 81 mg by mouth at bedtime.  Marland Kitchen atorvastatin (LIPITOR) 40 MG tablet Take 1 tablet (40 mg total) by mouth daily. FINAL REFILL WITHOUT OFFICE VISIT PLEASE CALL [phone]  . citalopram (CELEXA) 20 MG tablet Take 1 tablet (20 mg total) by mouth daily.  Marland Kitchen gabapentin (NEURONTIN) 300 MG capsule TAKE 1 CAPSULE(300 MG) BY MOUTH THREE TIMES DAILY  . isosorbide mononitrate (IMDUR) 30 MG 24 hr tablet TAKE 1 TABLET(30 MG) BY MOUTH AT BEDTIME  . lisinopril (ZESTRIL) 10 MG tablet TAKE 1 TABLET(10 MG) BY MOUTH DAILY  . metoprolol tartrate (LOPRESSOR) 25 MG tablet TAKE 1/2 TABLET(12.5 MG) BY MOUTH TWICE DAILY  . omeprazole (PRILOSEC) 20 MG capsule TAKE 1 CAPSULE(20 MG) BY MOUTH DAILY  . oxyCODONE (OXY IR/ROXICODONE) 5 MG immediate release tablet Take 1-2 tablets (5-10 mg total) by mouth every 6 (six)  hours as needed for severe pain (to take 2 tabs ONLY when severe pain- which is not usual for pt).  . predniSONE (DELTASONE) 1 MG tablet Take 4 tablets (4 mg total) by mouth daily with breakfast. Take with one 5 mg tablet to equal 9 mg daily.  . predniSONE (DELTASONE) 5 MG tablet Take 3 tablets (15 mg total) by mouth  daily with breakfast.  . predniSONE (DELTASONE) 5 MG tablet Take 2 tablets (10 mg total) by mouth daily with breakfast.  . predniSONE (DELTASONE) 5 MG tablet Take 2 tablets (10 mg total) by mouth daily with breakfast. Until 03/12/2020. Then taper by 1 mg monthly.  . senna-docusate (SENOKOT-S) 8.6-50 MG tablet Take 1 tablet by mouth daily for 30 doses.  . tamsulosin (FLOMAX) 0.4 MG CAPS capsule Take 0.4 mg by mouth daily.   . traMADol (ULTRAM) 50 MG tablet Take 2 tablets (100 mg total) by mouth 2 (two) times daily.   No facility-administered encounter medications on file as of 02/15/2020.    Allergies (verified) Celebrex [celecoxib] and Celebrex [celecoxib]   History: Past Medical History:  Diagnosis Date  . Aortic stenosis    mild echocardiogram 8/09 EF 60%, showed no regional wall motion abnml, mild LVH, mod focal basal septal hypertrophy and mild dyastolic dysfunction. 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 mean gradient across aortic valve was 8 mmHG. also mild L atrial enlargement and normal RV size and function. minimal AS on LHC in 7/10.   . Arthritis    "all over" (07/19/2016)  . BPH (benign prostatic hypertrophy)   . CAD (coronary artery disease)    [facility] (7/10) totally occluded proximal RCA with very robuse L to R collaterals, 50% proximal LAD stenosis, EF 65%, medical management.    . Chicken pox   . Chronic lower back pain    s/p surgical fusion  . Diverticulosis   . Esophageal stricture   . GERD (gastroesophageal reflux disease)   . Heart murmur   . Hepatitis B 1984  . Hiatal hernia   . History of radiation therapy 01/16/18- 03/05/18   Left Tonsil, 66 Gy in 33 fractions to high risk nodal echelons.   Marland Kitchen HLD (hyperlipidemia)   . HTN (hypertension)   . 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 Osteoarthritis   . TIA (transient ischemic attack) 1990s   hx  . tonsillar ca dx'd 11/2017  . Tubular adenoma of colon 2009   Past Surgical History:   Procedure Laterality Date  . BACK SURGERY    . CATARACT EXTRACTION W/ INTRAOCULAR LENS  IMPLANT, BILATERAL Bilateral 01/2012 - 02/2012  . DIRECT LARYNGOSCOPY Left 10/09/2017   Procedure: DIRECT LARYNGOSCOPY WITH BOPSY;  Surgeon: Flo Shanks, MD;  Location: [facility];  Service: ENT;  Laterality: Left;  . ESOPHAGOGASTRODUODENOSCOPY (EGD) WITH ESOPHAGEAL DILATION     "couple times" (07/19/2016)  . ESOPHAGOSCOPY Left 10/09/2017   Procedure: ESOPHAGOSCOPY;  Surgeon: Flo Shanks, MD;  Location: [facility];  Service: ENT;  Laterality: Left;  . IR GASTROSTOMY TUBE MOD SED  01/08/2018  . IR THORACENTESIS ASP PLEURAL SPACE W/IMG GUIDE  07/19/2016  . LAPAROSCOPIC CHOLECYSTECTOMY  1994  . LUMBAR DISC SURGERY  05/1996   L4-5; Dr. Newell Coral   . LUMBAR LAMINECTOMY/DECOMPRESSION MICRODISCECTOMY  10/2002   L3-4. Dr. Newell Coral  . MULTIPLE TOOTH EXTRACTIONS  1980s  . PARTIAL GLOSSECTOMY  12/02/2017   Dr. Hezzie Bump- [facility]  . pharyngoplasty for closure of tingue base defect  12/02/2017   Dr. Nicolette Bang- [facility]  . PICC LINE INSERTION  07/15/2016  . POSTERIOR LUMBAR FUSION  09/1996   Ray cage, L4-5 Dr. Rita Ohara  . PROSTATE BIOPSY  ~ 2017  . radical tonsillectomy Left 12/02/2017   Dr. Nicolette Bang at [facility]  . RIGID BRONCHOSCOPY Left 10/09/2017   Procedure: RIGID BRONCHOSCOPY;  Surgeon: Jodi Marble, MD;  Location: [facility];  Service: ENT;  Laterality: Left;  . TRACHEOSTOMY  12/02/2017   Dr. Nicolette Bang- [facility]   Family History  Problem Relation Age of Onset  . Heart disease Father 77       Living  . Coronary artery disease Father        CABG  . Alzheimer's disease Mother 15       Deceased  . Arthritis Mother   . Aneurysm Brother   . Stomach cancer Maternal Uncle   . Brain cancer Maternal Aunt        x2  . Obesity Daughter        Had Bypass Sx   Social History   Socioeconomic History  . Marital status: Married    Spouse name: etta  . Number of children: 2  .  Years of education: Not on file  . Highest education level: Not on file  Occupational History  . Occupation: retired    Fish farm manager: RETIRED    Comment: disabled due to back problems  Tobacco Use  . Smoking status: Former Smoker    Packs/day: 1.50    Years: 50.00    Pack years: 75.00    Types: Cigarettes    Quit date: 09/05/2017    Years since quitting: 2.4  . Smokeless tobacco: Never Used  . Tobacco comment: smoked less than 1 ppd for 40+ years; Using Vapor Cig  Vaping Use  . Vaping Use: Never used  Substance and Sexual Activity  . Alcohol use: Yes    Alcohol/week: 0.0 standard drinks    Comment: 07/19/2016 "might have a few drinks/year"  . Drug use: No  . Sexual activity: Never  Other Topics Concern  . Not on file  Social History Narrative   ** Merged History Encounter **       Married (3rd), [place]. 2 children from 1st marriage, 4 step children.    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       Cell # [phone]   Social Determinants of Health   Financial Resource Strain: Low Risk   . Difficulty of Paying Living Expenses: Not hard at all  Food Insecurity: No Food Insecurity  . Worried About Charity fundraiser in the Last Year: Never true  . Ran Out of Food in the Last Year: Never true  Transportation Needs: No Transportation Needs  . Lack of Transportation (Medical): No  . Lack of Transportation (Non-Medical): No  Physical Activity: Inactive  . Days of Exercise per Week: 0 days  . Minutes of Exercise per Session: 0 min  Stress: No Stress Concern Present  . Feeling of Stress : Not at all  Social Connections: Moderately Isolated  . Frequency of Communication with Friends and Family: More than three times a week  . Frequency of Social Gatherings with Friends and Family: Once a week  . Attends Religious Services: Never  . Active Member of Clubs or Organizations: No  . Attends Theatre manager Meetings: Never  . Marital Status: Married  Tobacco Counseling Counseling given: Not Answered Comment: smoked less than 1 ppd for 40+ years; Using Vapor Cig   Clinical Intake:  Pre-visit preparation completed: Yes  Pain : 0-10 Pain Score: 5  Pain Type: Acute pain Pain Location: Hand Pain Onset: 1 to 4 weeks ago Pain Frequency: Intermittent     Nutritional Status: BMI 25 -29 Overweight Nutritional Risks: None Diabetes: No  How often do you need to have someone help you when you read instructions, pamphlets, or other written materials from your doctor or pharmacy?: 1 - Never  Diabetic?No  Interpreter Needed?: No  Information entered by :: Caroleen Hamman LPN   Activities of Daily Living In your present state of health, do you have any difficulty performing the following activities: 02/15/2020 09/29/2019  Hearing? N N  Vision? N N  Difficulty concentrating or making decisions? Y N  Comment occasionally but nohing new -  Walking or climbing stairs? N N  Dressing or bathing? N N  Doing errands, shopping? N N  Preparing Food and eating ? N -  Using the Toilet? N -  In the past six months, have you accidently leaked urine? N -  Do you have problems with loss of bowel control? N -  Managing your Medications? N -  Managing your Finances? N -  Housekeeping or managing your Housekeeping? N -  Some recent data might be hidden    Patient Care Team: Delorse Limber as PCP - General (Family Medicine) Ladene Artist, MD as Consulting Physician (Gastroenterology) Larey Dresser, MD as Consulting Physician (Cardiology) Jovita Gamma, MD as Consulting Physician (Neurosurgery) Jodi Marble, MD as Consulting Physician (Otolaryngology) Eppie Gibson, MD as Attending Physician (Radiation Oncology) Leota Sauers, RN (Inactive) as Oncology Nurse Navigator (Oncology) Karie Mainland, RD as Dietitian (Nutrition) Wynelle Beckmann, Melodie Bouillon, PT as  Physical Therapist (Physical Therapy) Sharen Counter, [place] as Speech Language Pathologist (Speech Pathology) Kennith Center, LCSW as Social Worker Ceasar Mons, MD as Consulting Physician (Urology) Madelin Rear, [facility] as Pharmacist (Pharmacist) Dorna Leitz, MD as Consulting Physician (Orthopedic Surgery) Delorse Limber (Physician Assistant)  Indicate any recent Medical Services you may have received from other than [facility] providers in the past year (date may be approximate).     Assessment:   This is a routine wellness examination for [place].  Hearing/Vision screen  Hearing Screening   125Hz  250Hz  500Hz  1000Hz  2000Hz  3000Hz  4000Hz  6000Hz  8000Hz   Right ear:           Left ear:           Comments: Mild hearing loss  Vision Screening Comments: Reading glasses only Last eye exam-3-4 yrs ago Dr. Sabra Heck an upcoming appt  Dietary issues and exercise activities discussed: Current Exercise Habits: The patient does not participate in regular exercise at present, Exercise limited by: orthopedic condition(s)  Goals      Patient Stated   .  <enter goal here> (pt-stated)      Maintains current health and activity level.       Other   .  PharmD Care Plan      CARE PLAN ENTRY  Current Barriers:  . Chronic Disease Management support, education, and care coordination needs related to Hypertension, Hyperlipidemia, Gastroesophageal Reflux Disease, and pain/osteoarthritis.   Hypertension . Pharmacist Clinical Goal(s): o Over the next 180 days, patient will work with PharmD and providers to maintain BP goal <140/90 . Current regimen:   Amlodipine 10 mg daily  Lisinopril 10 mg daily in morning  Metoprolol tartrate 12.5 mg BID  Isosorbide mononitrate ER 30 mg daily . Interventions: o Monitoring plan - home monitoring at least once weekly  . Patient self care activities - Over the next 180 days, patient will: o Check BP at home once weekly, document, and  provide at future appointments o Ensure daily salt intake < 2300 mg/day  Hyperlipidemia . Pharmacist Clinical Goal(s): o Over the next 180 days, patient will work with PharmD and providers to maintain LDL goal < 70 . Current regimen:  o Atorvastatin 40 mg daily  . Interventions: o Continue current management. . Patient self care activities - Over the next 180 days, patient will: o Continue current management.   Pain . Pharmacist Clinical Goal(s) o Over the next 180 days, patient will work with PharmD and providers to minimize recurring pain.  . Current regimen:   Oxycodone 5 mg every 6 hours as needed  Tramadol 100 mg every 6 hours as needed  Gabapentin 300 mg every 8 hours   Voltaren gel as needed . Interventions: o Continue current management. . Patient self care activities - Over the next 180 days, patient will: o Continue current management.  GERD . Pharmacist Clinical Goal(s) o Over the next 180 days, patient will work with PharmD and providers to minimize any recurring symptoms of acid reflux . Current regimen:  o Omeprazole 20 mg once daily  . Interventions: o No medication changes, continue avoidance of triggers including spicy foods  . Patient self care activities - Over the next 180 days, patient will: o Continue current management.    Medication management . Pharmacist Clinical Goal(s): o Over the next 180 days, patient will work with PharmD and providers to maintain optimal medication adherence . Current pharmacy: [facility] (local and mail order) . Interventions o Comprehensive medication review performed. o Continue current medication management strategy. . Patient self care activities - Over the next 180 days, patient will: o Focus on medication adherence by continuing current management o Take medications as prescribed o Report any questions or concerns to PharmD and/or provider(s) Initial goal documentation.      Depression Screen PHQ 2/9 Scores  02/15/2020 02/08/2020 09/29/2019 09/23/2019 07/29/2019 05/27/2019 04/24/2019  PHQ - 2 Score 1 2 0 0 1 1 1   PHQ- 9 Score - - 1 - - - 4    Fall Risk Fall Risk  02/15/2020 02/08/2020 09/23/2019 07/29/2019 05/27/2019  Falls in the past year? 0 0 0 0 0  Number falls in past yr: 0 0 - - -  Injury with Fall? 0 0 - - -  Follow up Falls prevention discussed - - - -    FALL RISK PREVENTION PERTAINING TO THE HOME:  Any stairs in or around the home? Yes  If so, are there any without handrails? No  Home free of loose throw rugs in walkways, pet beds, electrical cords, etc? Yes  Adequate lighting in your home to reduce risk of falls? Yes   ASSISTIVE DEVICES UTILIZED TO PREVENT FALLS:  Life alert? No  Use of a cane, walker or w/c? No  Grab bars in the bathroom? Yes  Shower chair or bench in shower? No  Elevated toilet seat or a handicapped toilet? No   TIMED UP AND GO:  Was the test performed? No . phone visit   Cognitive Function:Normal cognitive status assessed by  this Nurse Health Advisor. No abnormalities found.   MMSE - Mini Mental State Exam 05/30/2017  Orientation to time 5  Orientation to Place 5  Registration 3  Attention/ Calculation 5  Recall 3  Language- name 2 objects 2  Language- repeat 1  Language- follow 3 step command 3  Language- read & follow direction 1  Write a sentence 1  Copy design 1  Total score 30        Immunizations Immunization History  Administered Date(s) Administered  . Fluad Quad(high Dose 65+) 10/15/2018, 11/06/2019  . Influenza Split 11/26/2010, 10/11/2011, 11/03/2012  . Influenza Whole 11/14/2006, 12/04/2007, 10/06/2008, 10/25/2009  . Influenza, High Dose Seasonal PF 11/01/2016, 10/30/2017  . Influenza, Seasonal, Injecte, Preservative Fre 12/07/2014  . Influenza,inj,Quad PF,6+ Mos 11/04/2015  . Influenza-Unspecified 11/19/2013  . PFIZER SARS-COV-2 Vaccination 02/25/2019, 03/18/2019, 11/16/2019  . Pneumococcal Conjugate-13 12/07/2014  .  Pneumococcal Polysaccharide-23 05/30/2017  . Tdap 01/28/2020    TDAP status: Up to date  Flu Vaccine status: Up to date  Pneumococcal vaccine status: Up to date  Covid-19 vaccine status: Completed vaccines  Qualifies for Shingles Vaccine? Yes   Zostavax completed No   Shingrix Completed?: No.    Education has been provided regarding the importance of this vaccine. Patient has been advised to call insurance company to determine out of pocket expense if they have not yet received this vaccine. Advised may also receive vaccine at local pharmacy or Health Dept. Verbalized acceptance and understanding.  Screening Tests Health Maintenance  Topic Date Due  . DTAP VACCINES (1) 09/02/2078 (Originally 12/04/1940)  . COLONOSCOPY (Pts 45-5yrs Insurance coverage will need to be confirmed)  03/22/2020  . COVID-19 Vaccine (4 - Booster for Pfizer series) 05/16/2020  . DTaP/Tdap/Td (2 - Td or Tdap) 01/27/2030  . TETANUS/TDAP  01/27/2030  . INFLUENZA VACCINE  Completed  . Hepatitis C Screening  Completed  . PNA vac Low Risk Adult  Completed    Health Maintenance  There are no preventive care reminders to display for this patient.  Colorectal cancer screening: No longer required.   Lung Cancer Screening: (Low Dose CT Chest recommended if Age 51-80 years, 30 pack-year currently smoking OR have quit w/in 15years.) does qualify.   Lung Cancer Screening Referral: Ordered today  Additional Screening:  Hepatitis C Screening:  Completed 01/11/2020  Vision Screening: Recommended annual ophthalmology exams for early detection of glaucoma and other disorders of the eye. Is the patient up to date with their annual eye exam?  No  Who is the provider or what is the name of the office in which the patient attends annual eye exams? Dr. Prudencio Burly Patient has an upcoming appt  Dental Screening: Recommended annual dental exams for proper oral hygiene  Community Resource Referral / Chronic Care  Management: CRR required this visit?  No   CCM required this visit?  No      Plan:     I have personally reviewed and noted the following in the patient's chart:   . Medical and social history . Use of alcohol, tobacco or illicit drugs  . Current medications and supplements . Functional ability and status . Nutritional status . Physical activity . Advanced directives . List of other physicians . Hospitalizations, surgeries, and ER visits in previous 12 months . Vitals . Screenings to include cognitive, depression, and falls . Referrals and appointments  In addition, I have reviewed and discussed with patient certain preventive protocols, quality metrics, and best practice recommendations. A written personalized care plan for preventive services as well as general preventive health recommendations were provided to patient.  Due to this being a telephonic visit, the after visit summary with patients personalized plan was offered to patient via mail or my-chart.  Patient would like to access on my-chart.   Marta Antu, LPN   [id number]  Nurse Health Advisor  Nurse Notes: None

## 2020-02-15 ENCOUNTER — Ambulatory Visit (INDEPENDENT_AMBULATORY_CARE_PROVIDER_SITE_OTHER): Payer: Medicare Other

## 2020-02-15 VITALS — Ht 68.0 in | Wt 172.0 lb

## 2020-02-15 DIAGNOSIS — Z0000 Encounter for general adult medical examination without abnormal findings: Secondary | ICD-10-CM

## 2020-02-15 DIAGNOSIS — Z122 Encounter for screening for malignant neoplasm of respiratory organs: Secondary | ICD-10-CM | POA: Diagnosis not present

## 2020-02-15 NOTE — Patient Instructions (Signed)
Brandon [name] , Thank you for taking time to complete your Medicare Wellness Visit. I appreciate your ongoing commitment to your health goals. Please review the following plan we discussed and let me know if I can assist you in the future.   Screening recommendations/referrals: Colonoscopy: No longer required Recommended yearly ophthalmology/optometry visit for glaucoma screening and checkup Recommended yearly dental visit for hygiene and checkup  Vaccinations: Influenza vaccine: Up to date Pneumococcal vaccine: Completed vaccines Tdap vaccine: Up to date-Due-01/27/2030 Shingles vaccine: Discuss with pharmacy Covid-19: Completed vaccines  Advanced directives: Copy in chart  Conditions/risks identified: See,problem list  Next appointment: Follow up in one year for your annual wellness visit.   Preventive Care 78 Years and Older, Male Preventive care refers to lifestyle choices and visits with your health care provider that can promote health and wellness. What does preventive care include?  A yearly physical exam. This is also called an annual well check.  Dental exams once or twice a year.  Routine eye exams. Ask your health care provider how often you should have your eyes checked.  Personal lifestyle choices, including:  Daily care of your teeth and gums.  Regular physical activity.  Eating a healthy diet.  Avoiding tobacco and drug use.  Limiting alcohol use.  Practicing safe sex.  Taking low doses of aspirin every day.  Taking vitamin and mineral supplements as recommended by your health care provider. What happens during an annual well check? The services and screenings done by your health care provider during your annual well check will depend on your age, overall health, lifestyle risk factors, and family history of disease. Counseling  Your health care provider may ask you questions about your:  Alcohol use.  Tobacco use.  Drug use.  Emotional  well-being.  Home and relationship well-being.  Sexual activity.  Eating habits.  History of falls.  Memory and ability to understand (cognition).  Work and work Statistician. Screening  You may have the following tests or measurements:  Height, weight, and BMI.  Blood pressure.  Lipid and cholesterol levels. These may be checked every 5 years, or more frequently if you are over 50 years old.  Skin check.  Lung cancer screening. You may have this screening every year starting at age 78 if you have a 30-pack-year history of smoking and currently smoke or have quit within the past 15 years.  Fecal occult blood test (FOBT) of the stool. You may have this test every year starting at age 78.  Flexible sigmoidoscopy or colonoscopy. You may have a sigmoidoscopy every 5 years or a colonoscopy every 10 years starting at age 78.  Prostate cancer screening. Recommendations will vary depending on your family history and other risks.  Hepatitis C blood test.  Hepatitis B blood test.  Sexually transmitted disease (STD) testing.  Diabetes screening. This is done by checking your blood sugar (glucose) after you have not eaten for a while (fasting). You may have this done every 1-3 years.  Abdominal aortic aneurysm (AAA) screening. You may need this if you are a current or former smoker.  Osteoporosis. You may be screened starting at age 78 if you are at high risk. Talk with your health care provider about your test results, treatment options, and if necessary, the need for more tests. Vaccines  Your health care provider may recommend certain vaccines, such as:  Influenza vaccine. This is recommended every year.  Tetanus, diphtheria, and acellular pertussis (Tdap, Td) vaccine. You may need a Td  booster every 10 years.  Zoster vaccine. You may need this after age 78.  Pneumococcal 13-valent conjugate (PCV13) vaccine. One dose is recommended after age 78.  Pneumococcal  polysaccharide (PPSV23) vaccine. One dose is recommended after age 78. Talk to your health care provider about which screenings and vaccines you need and how often you need them. This information is not intended to replace advice given to you by your health care provider. Make sure you discuss any questions you have with your health care provider. Document Released: 02/18/2015 Document Revised: 10/12/2015 Document Reviewed: 11/23/2014 Elsevier Interactive Patient Education  2017 [place] Prevention in the Home Falls can cause injuries. They can happen to people of all ages. There are many things you can do to make your home safe and to help prevent falls. What can I do on the outside of my home?  Regularly fix the edges of walkways and driveways and fix any cracks.  Remove anything that might make you trip as you walk through a door, such as a raised step or threshold.  Trim any bushes or trees on the path to your home.  Use bright outdoor lighting.  Clear any walking paths of anything that might make someone trip, such as rocks or tools.  Regularly check to see if handrails are loose or broken. Make sure that both sides of any steps have handrails.  Any raised decks and porches should have guardrails on the edges.  Have any leaves, snow, or ice cleared regularly.  Use sand or salt on walking paths during winter.  Clean up any spills in your garage right away. This includes oil or grease spills. What can I do in the bathroom?  Use night lights.  Install grab bars by the toilet and in the tub and shower. Do not use towel bars as grab bars.  Use non-skid mats or decals in the tub or shower.  If you need to sit down in the shower, use a plastic, non-slip stool.  Keep the floor dry. Clean up any water that spills on the floor as soon as it happens.  Remove soap buildup in the tub or shower regularly.  Attach bath mats securely with double-sided non-slip rug  tape.  Do not have throw rugs and other things on the floor that can make you trip. What can I do in the bedroom?  Use night lights.  Make sure that you have a light by your bed that is easy to reach.  Do not use any sheets or blankets that are too big for your bed. They should not hang down onto the floor.  Have a firm chair that has side arms. You can use this for support while you get dressed.  Do not have throw rugs and other things on the floor that can make you trip. What can I do in the kitchen?  Clean up any spills right away.  Avoid walking on wet floors.  Keep items that you use a lot in easy-to-reach places.  If you need to reach something above you, use a strong step stool that has a grab bar.  Keep electrical cords out of the way.  Do not use floor polish or wax that makes floors slippery. If you must use wax, use non-skid floor wax.  Do not have throw rugs and other things on the floor that can make you trip. What can I do with my stairs?  Do not leave any items on the stairs.  Make sure that there are handrails on both sides of the stairs and use them. Fix handrails that are broken or loose. Make sure that handrails are as long as the stairways.  Check any carpeting to make sure that it is firmly attached to the stairs. Fix any carpet that is loose or worn.  Avoid having throw rugs at the top or bottom of the stairs. If you do have throw rugs, attach them to the floor with carpet tape.  Make sure that you have a light switch at the top of the stairs and the bottom of the stairs. If you do not have them, ask someone to add them for you. What else can I do to help prevent falls?  Wear shoes that:  Do not have high heels.  Have rubber bottoms.  Are comfortable and fit you well.  Are closed at the toe. Do not wear sandals.  If you use a stepladder:  Make sure that it is fully opened. Do not climb a closed stepladder.  Make sure that both sides of the  stepladder are locked into place.  Ask someone to hold it for you, if possible.  Clearly mark and make sure that you can see:  Any grab bars or handrails.  First and last steps.  Where the edge of each step is.  Use tools that help you move around (mobility aids) if they are needed. These include:  Canes.  Walkers.  Scooters.  Crutches.  Turn on the lights when you go into a dark area. Replace any light bulbs as soon as they burn out.  Set up your furniture so you have a clear path. Avoid moving your furniture around.  If any of your floors are uneven, fix them.  If there are any pets around you, be aware of where they are.  Review your medicines with your doctor. Some medicines can make you feel dizzy. This can increase your chance of falling. Ask your doctor what other things that you can do to help prevent falls. This information is not intended to replace advice given to you by your health care provider. Make sure you discuss any questions you have with your health care provider. Document Released: 11/18/2008 Document Revised: 06/30/2015 Document Reviewed: 02/26/2014 Elsevier Interactive Patient Education  2017 [employer].

## 2020-02-16 DIAGNOSIS — Z4789 Encounter for other orthopedic aftercare: Secondary | ICD-10-CM | POA: Diagnosis not present

## 2020-02-16 DIAGNOSIS — S61411A Laceration without foreign body of right hand, initial encounter: Secondary | ICD-10-CM | POA: Diagnosis not present

## 2020-02-25 ENCOUNTER — Other Ambulatory Visit (HOSPITAL_COMMUNITY): Payer: Self-pay | Admitting: Cardiology

## 2020-03-10 DIAGNOSIS — R748 Abnormal levels of other serum enzymes: Secondary | ICD-10-CM | POA: Diagnosis not present

## 2020-03-10 DIAGNOSIS — K760 Fatty (change of) liver, not elsewhere classified: Secondary | ICD-10-CM | POA: Diagnosis not present

## 2020-03-10 DIAGNOSIS — I81 Portal vein thrombosis: Secondary | ICD-10-CM | POA: Diagnosis not present

## 2020-03-11 ENCOUNTER — Other Ambulatory Visit: Payer: Self-pay | Admitting: Nurse Practitioner

## 2020-03-11 DIAGNOSIS — I81 Portal vein thrombosis: Secondary | ICD-10-CM

## 2020-03-16 ENCOUNTER — Telehealth: Payer: Self-pay | Admitting: *Deleted

## 2020-03-16 MED ORDER — OXYCODONE HCL 5 MG PO TABS
5.0000 mg | ORAL_TABLET | Freq: Four times a day (QID) | ORAL | 0 refills | Status: DC | PRN
Start: 2020-03-16 — End: 2020-04-18

## 2020-03-16 MED ORDER — TRAMADOL HCL 50 MG PO TABS: 100.0000 mg | ORAL_TABLET | Freq: Two times a day (BID) | ORAL | 5 refills | Status: DC

## 2020-03-16 NOTE — Telephone Encounter (Signed)
Refilled Oxycodone QID prn #120 and Tramadol 2 tabs BID for restless leg syndrome #120 5 RFs

## 2020-03-16 NOTE — Telephone Encounter (Signed)
Mr Brandon [name] called for a refill of oxycodone and running low on tramadol. His next appt is 03/21/20 with [name].  Per the PMP his last refills were  02/24/2020 Tramadol Hcl 50 Mg 120.00   02/08/2020 Oxycodone Hcl (Ir) 5 Mg Tablet120.00

## 2020-03-17 ENCOUNTER — Ambulatory Visit
Admission: RE | Admit: 2020-03-17 | Discharge: 2020-03-17 | Disposition: A | Discharge status: Home / Self Care | Payer: Medicare Other | Source: Ambulatory Visit | Attending: Nurse Practitioner | Admitting: Nurse Practitioner

## 2020-03-17 DIAGNOSIS — I81 Portal vein thrombosis: Secondary | ICD-10-CM | POA: Diagnosis not present

## 2020-03-17 DIAGNOSIS — K7689 Other specified diseases of liver: Secondary | ICD-10-CM | POA: Diagnosis not present

## 2020-03-17 DIAGNOSIS — I714 Abdominal aortic aneurysm, without rupture: Secondary | ICD-10-CM | POA: Diagnosis not present

## 2020-03-17 DIAGNOSIS — I82890 Acute embolism and thrombosis of other specified veins: Secondary | ICD-10-CM | POA: Diagnosis not present

## 2020-03-21 ENCOUNTER — Encounter
Payer: Medicare Other | Attending: Physical Medicine and Rehabilitation | Admitting: Physical Medicine and Rehabilitation

## 2020-03-21 DIAGNOSIS — Z5181 Encounter for therapeutic drug level monitoring: Secondary | ICD-10-CM | POA: Insufficient documentation

## 2020-03-21 DIAGNOSIS — Z79891 Long term (current) use of opiate analgesic: Secondary | ICD-10-CM | POA: Insufficient documentation

## 2020-03-21 DIAGNOSIS — G894 Chronic pain syndrome: Secondary | ICD-10-CM | POA: Insufficient documentation

## 2020-03-24 ENCOUNTER — Ambulatory Visit (HOSPITAL_BASED_OUTPATIENT_CLINIC_OR_DEPARTMENT_OTHER)
Admission: RE | Admit: 2020-03-24 | Discharge: 2020-03-24 | Disposition: A | Discharge status: Home / Self Care | Payer: Medicare Other | Source: Ambulatory Visit | Attending: Cardiology | Admitting: Cardiology

## 2020-03-24 ENCOUNTER — Ambulatory Visit (HOSPITAL_COMMUNITY)
Admission: RE | Admit: 2020-03-24 | Discharge: 2020-03-24 | Disposition: A | Discharge status: Home / Self Care | Payer: Medicare Other | Source: Ambulatory Visit | Attending: Cardiology | Admitting: Cardiology

## 2020-03-24 ENCOUNTER — Ambulatory Visit (INDEPENDENT_AMBULATORY_CARE_PROVIDER_SITE_OTHER): Payer: Medicare Other

## 2020-03-24 ENCOUNTER — Other Ambulatory Visit: Payer: Self-pay

## 2020-03-24 ENCOUNTER — Encounter (HOSPITAL_COMMUNITY): Payer: Self-pay | Admitting: Cardiology

## 2020-03-24 VITALS — BP 144/78 | HR 74 | Wt 173.6 lb

## 2020-03-24 DIAGNOSIS — E7849 Other hyperlipidemia: Secondary | ICD-10-CM | POA: Diagnosis not present

## 2020-03-24 DIAGNOSIS — I10 Essential (primary) hypertension: Secondary | ICD-10-CM | POA: Diagnosis not present

## 2020-03-24 DIAGNOSIS — M353 Polymyalgia rheumatica: Secondary | ICD-10-CM | POA: Diagnosis not present

## 2020-03-24 DIAGNOSIS — I7143 Infrarenal abdominal aortic aneurysm, without rupture: Secondary | ICD-10-CM

## 2020-03-24 DIAGNOSIS — R0602 Shortness of breath: Secondary | ICD-10-CM | POA: Diagnosis present

## 2020-03-24 DIAGNOSIS — I714 Abdominal aortic aneurysm, without rupture: Secondary | ICD-10-CM | POA: Diagnosis not present

## 2020-03-24 DIAGNOSIS — I359 Nonrheumatic aortic valve disorder, unspecified: Secondary | ICD-10-CM

## 2020-03-24 DIAGNOSIS — I350 Nonrheumatic aortic (valve) stenosis: Secondary | ICD-10-CM | POA: Diagnosis not present

## 2020-03-24 DIAGNOSIS — Z8249 Family history of ischemic heart disease and other diseases of the circulatory system: Secondary | ICD-10-CM | POA: Diagnosis not present

## 2020-03-24 DIAGNOSIS — Z87891 Personal history of nicotine dependence: Secondary | ICD-10-CM | POA: Diagnosis not present

## 2020-03-24 DIAGNOSIS — R0789 Other chest pain: Secondary | ICD-10-CM | POA: Diagnosis not present

## 2020-03-24 DIAGNOSIS — I110 Hypertensive heart disease with heart failure: Secondary | ICD-10-CM | POA: Diagnosis not present

## 2020-03-24 DIAGNOSIS — I2510 Atherosclerotic heart disease of native coronary artery without angina pectoris: Secondary | ICD-10-CM | POA: Diagnosis not present

## 2020-03-24 DIAGNOSIS — I509 Heart failure, unspecified: Secondary | ICD-10-CM | POA: Diagnosis not present

## 2020-03-24 DIAGNOSIS — Z7901 Long term (current) use of anticoagulants: Secondary | ICD-10-CM | POA: Insufficient documentation

## 2020-03-24 DIAGNOSIS — Z79899 Other long term (current) drug therapy: Secondary | ICD-10-CM | POA: Insufficient documentation

## 2020-03-24 DIAGNOSIS — Z7982 Long term (current) use of aspirin: Secondary | ICD-10-CM | POA: Diagnosis not present

## 2020-03-24 DIAGNOSIS — Z8673 Personal history of transient ischemic attack (TIA), and cerebral infarction without residual deficits: Secondary | ICD-10-CM | POA: Insufficient documentation

## 2020-03-24 DIAGNOSIS — K219 Gastro-esophageal reflux disease without esophagitis: Secondary | ICD-10-CM

## 2020-03-24 HISTORY — DX: Heart failure, unspecified: I50.9

## 2020-03-24 LAB — BASIC METABOLIC PANEL
Anion gap: 10 (ref 5–15)
BUN: 14 mg/dL (ref 8–23)
CO2: 28 mmol/L (ref 22–32)
Calcium: 8.9 mg/dL (ref 8.9–10.3)
Chloride: 101 mmol/L (ref 98–111)
Creatinine, Ser: 0.68 mg/dL (ref 0.61–1.24)
GFR, Estimated: >60 mL/min (ref >60)
Glucose, Bld: 129 mg/dL — ABNORMAL HIGH (ref 70–99)
Potassium: 3.9 mmol/L (ref 3.5–5.1)
Sodium: 139 mmol/L (ref 135–145)

## 2020-03-24 LAB — ECHOCARDIOGRAM COMPLETE
AR max vel: 0.92 cm2
AV Area VTI: 0.96 cm2
AV Area mean vel: 0.95 cm2
AV Mean grad: 50 mmHg
AV Peak grad: 70.4 mmHg
Ao pk vel: 4.19 m/s
Area-P 1/2: 3.53 cm2
P 1/2 time: 658 msec
S' Lateral: 4.1 cm

## 2020-03-24 LAB — CBC
HCT: 40.4 % (ref 39.0–52.0)
Hemoglobin: 12.2 g/dL — ABNORMAL LOW (ref 13.0–17.0)
MCH: 24.6 pg — ABNORMAL LOW (ref 26.0–34.0)
MCHC: 30.2 g/dL (ref 30.0–36.0)
MCV: 81.6 fL (ref 80.0–100.0)
Platelets: 159 10*3/uL (ref 150–400)
RBC: 4.95 MIL/uL (ref 4.22–5.81)
RDW: 14.6 % (ref 11.5–15.5)
WBC: 6.6 10*3/uL (ref 4.0–10.5)
nRBC: 0 % (ref 0.0–0.2)

## 2020-03-24 LAB — LIPID PANEL
Cholesterol: 166 mg/dL (ref 0–200)
HDL: 53 mg/dL (ref >40)
LDL Cholesterol: 98 mg/dL (ref 0–99)
Total CHOL/HDL Ratio: 3.1 RATIO
Triglycerides: 76 mg/dL (ref <150)
VLDL: 15 mg/dL (ref 0–40)

## 2020-03-24 MED ORDER — CARVEDILOL 6.25 MG PO TABS: 6.2500 mg | ORAL_TABLET | Freq: Two times a day (BID) | ORAL | 11 refills | Status: DC

## 2020-03-24 NOTE — Progress Notes (Signed)
Chronic Care Management - will have TOC 04/19/2020 to [place] Note  03/24/2020 Name:  KENTAVIOUS MICHELE MRN:  [id number] DOB:  Feb 08, 1940  Subjective: ARTURO SOFRANKO is an 80 y.o. year old male who is a primary patient of Delorse Limber.  The CCM team was consulted for assistance with disease management and care coordination needs.   Engaged with patient by telephone for follow up visit in response to provider referral for pharmacy case management and/or care coordination services.   Consent to Services:  The patient was given information about Chronic Care Management services, agreed to services, and gave verbal consent prior to initiation of services.  Please see initial visit note for detailed documentation.  Patient Care Team: Delorse Limber as PCP - General (Family Medicine) Ladene Artist, MD as Consulting Physician (Gastroenterology) Larey Dresser, MD as Consulting Physician (Cardiology) Jovita Gamma, MD as Consulting Physician (Neurosurgery) Jodi Marble, MD as Consulting Physician (Otolaryngology) Eppie Gibson, MD as Attending Physician (Radiation Oncology) Leota Sauers, RN (Inactive) as Oncology Nurse Navigator (Oncology) Karie Mainland, RD as Dietitian (Nutrition) Wynelle Beckmann, Melodie Bouillon, PT as Physical Therapist (Physical Therapy) Sharen Counter, CCC-SLP as Speech Language Pathologist (Speech Pathology) Kennith Center, LCSW as Social Worker Ceasar Mons, MD as Consulting Physician (Urology) Madelin Rear, [facility] as Pharmacist (Pharmacist) Dorna Leitz, MD as Consulting Physician (Orthopedic Surgery) Brunetta Jeans, PA-C (Physician Assistant)  Objective: Lab Results  Component Value Date   CREATININE 0.64 (L) 01/11/2020   BUN 17 01/11/2020   GFR 96.52 09/29/2019   GFRNONAA 94 01/11/2020   GFRAA 109 01/11/2020   NA 139 01/11/2020   K 3.6 01/11/2020   CALCIUM 9.0 01/11/2020   CO2 28 01/11/2020   Lab Results   Component Value Date/Time   HGBA1C 5.4 01/22/2019 10:58 AM   HGBA1C 5.9 02/15/2016 11:35 AM   GFR 96.52 09/29/2019 11:25 AM   GFR 123.02 05/30/2017 10:54 AM    Last diabetic Eye exam: No results found for: HMDIABEYEEXA  Last diabetic Foot exam: No results found for: HMDIABFOOTEX  Lab Results  Component Value Date   CHOL 132 01/22/2019   HDL 42 01/22/2019   LDLCALC 66 01/22/2019   LDLDIRECT 81.2 05/25/2008   TRIG 120 01/22/2019   CHOLHDL 3.1 01/22/2019   Hepatic Function Latest Ref Rng & Units 01/11/2020 01/11/2020 09/29/2019  Total Protein 6.1 - 8.1 g/dL 6.4 6.5 6.8  Albumin 3.5 - 5.2 g/dL - - 3.7  AST 10 - 35 U/L - 10 10  ALT 9 - 46 U/L - 6(L) 8  Alk Phosphatase 39 - 117 U/L - - 180(H)  Total Bilirubin 0.2 - 1.2 mg/dL - 0.5 0.5  Bilirubin, Direct 0.0 - 0.3 mg/dL - - -   Lab Results  Component Value Date/Time   TSH 1.84 09/29/2019 11:25 AM   TSH 1.162 06/26/2019 11:35 AM   TSH 0.881 11/25/2018 01:39 PM   TSH 0.95 07/04/2016 10:52 AM   FREET4 1.04 11/06/2017 09:29 AM   CBC Latest Ref Rng & Units 01/11/2020 09/29/2019 02/17/2018  WBC 3.8 - 10.8 Thousand/uL 10.2 7.3 5.3  Hemoglobin 13.2 - 17.1 g/dL 12.2(L) 11.8(L) 12.8(L)  Hematocrit 38.5 - 50.0 % 39.2 35.3(L) 38.2(L)  Platelets 140 - 400 Thousand/uL 163 194.0 175.0   Lab Results  Component Value Date/Time   VD25OH 30.96 03/05/2016 03:26 PM   VD25OH 33 05/25/2008 08:45 PM    Clinical ASCVD: Yes  The ASCVD Risk score (  Renaye Rakers., et al., 2013) failed to calculate for the following reasons:   The patient has a prior MI or stroke diagnosis    Depression screen [facility] 2/9 02/15/2020 02/08/2020 09/29/2019  Decreased Interest 0 1 0  Down, Depressed, Hopeless 1 1 0  PHQ - 2 Score 1 2 0  Altered sleeping - - 0  Tired, decreased energy - - 1  Change in appetite - - 0  Feeling bad or failure about yourself  - - 0  Trouble concentrating - - 0  Moving slowly or fidgety/restless - - 0  Suicidal thoughts - - 0  PHQ-9 Score - - 1   Difficult doing work/chores - - -  Some recent data might be hidden    Social History   Tobacco Use  Smoking Status Former Smoker  . Packs/day: 1.50  . Years: 50.00  . Pack years: 75.00  . Types: Cigarettes  . Quit date: 09/05/2017  . Years since quitting: 2.5  Smokeless Tobacco Never Used  Tobacco Comment   smoked less than 1 ppd for 40+ years; Using Vapor Cig   BP Readings from Last 3 Encounters:  02/10/20 (!) 128/55  02/08/20 (!) 141/70  01/28/20 (!) 163/80   Pulse Readings from Last 3 Encounters:  02/10/20 63  02/08/20 71  01/28/20 80   Wt Readings from Last 3 Encounters:  02/15/20 172 lb (78 kg)  02/10/20 172 lb (78 kg)  02/08/20 170 lb (77.1 kg)   Assessment/Interventions: Review of patient past medical history, allergies, medications, health status, including review of consultants reports, laboratory and other test data, was performed as part of comprehensive evaluation and provision of chronic care management services.   CCM Care Plan Allergies  Allergen Reactions  . Celebrex [Celecoxib] Hives and Itching  . Celebrex [Celecoxib]    Medications Reviewed Today    Reviewed by Marta Antu, LPN (Licensed Practical Nurse) on 02/15/20 at 34  Med List Status: <None>  Medication Order Taking? Sig Documenting Provider Last Dose Status Informant  amLODipine (NORVASC) 10 MG tablet [id number]  Take 1 tablet (10 mg total) by mouth daily. Larey Dresser, MD  Active   aspirin 81 MG EC tablet [id number]  Take 81 mg by mouth at bedtime. [provider]  Active Self  atorvastatin (LIPITOR) 40 MG tablet [id number]  Take 1 tablet (40 mg total) by mouth daily. FINAL REFILL WITHOUT OFFICE VISIT PLEASE CALL [phone] Larey Dresser, MD  Active   citalopram (CELEXA) 20 MG tablet [id number]  Take 1 tablet (20 mg total) by mouth daily. Lovorn, Jinny Blossom, MD  Active   gabapentin (NEURONTIN) 300 MG capsule [id number]  TAKE 1 CAPSULE(300 MG) BY MOUTH THREE TIMES DAILY Lovorn,  Megan, MD  Active   isosorbide mononitrate (IMDUR) 30 MG 24 hr tablet [id number]  TAKE 1 TABLET(30 MG) BY MOUTH AT BEDTIME Brunetta Jeans, PA-C  Active   lisinopril (ZESTRIL) 10 MG tablet [id number]  TAKE 1 TABLET(10 MG) BY MOUTH DAILY Larey Dresser, MD  Active   metoprolol tartrate (LOPRESSOR) 25 MG tablet [id number]  TAKE 1/2 TABLET(12.5 MG) BY MOUTH TWICE DAILY Larey Dresser, MD  Active   omeprazole (PRILOSEC) 20 MG capsule [id number]  TAKE 1 CAPSULE(20 MG) BY MOUTH DAILY Brunetta Jeans, PA-C  Active   oxyCODONE (OXY IR/ROXICODONE) 5 MG immediate release tablet [id number]  Take 1-2 tablets (5-10 mg total) by mouth every 6 (six) hours as needed for severe pain (to take 2 tabs ONLY when  severe pain- which is not usual for pt). Lovorn, Jinny Blossom, MD  Active   predniSONE (DELTASONE) 1 MG tablet [id number]  Take 4 tablets (4 mg total) by mouth daily with breakfast. Take with one 5 mg tablet to equal 9 mg daily. Bo Merino, MD  Active   predniSONE (DELTASONE) 5 MG tablet [id number]  Take 3 tablets (15 mg total) by mouth daily with breakfast.  Patient not taking: Reported on 02/10/2020   Brunetta Jeans, PA-C  Active   predniSONE (DELTASONE) 5 MG tablet [id number]  Take 2 tablets (10 mg total) by mouth daily with breakfast. Bo Merino, MD  Active   predniSONE (DELTASONE) 5 MG tablet [id number]  Take 2 tablets (10 mg total) by mouth daily with breakfast. Until 03/12/2020. Then taper by 1 mg monthly. Bo Merino, MD  Active   senna-docusate (SENOKOT-S) 8.6-50 MG tablet [id number]  Take 1 tablet by mouth daily for 30 doses. Wyvonnia Dusky, MD  Active   tamsulosin [facility]) 0.4 MG CAPS capsule [id number]  Take 0.4 mg by mouth daily.  [provider]  Active   traMADol (ULTRAM) 50 MG tablet [id number]  Take 2 tablets (100 mg total) by mouth 2 (two) times daily. Courtney Heys, MD  Active          Patient Active Problem List   Diagnosis Date Noted  . Primary osteoarthritis of both  hands 01/11/2020  . Piriformis syndrome of both sides 09/23/2019  . Pain of both shoulder joints 07/29/2019  . Chronic right hip pain 07/29/2019  . Sciatica associated with disorder of lumbar spine 04/24/2019  . Primary osteoarthritis involving multiple joints 04/24/2019  . Chronic pain syndrome 04/24/2019  . Ankyloglossia 06/23/2018  . Encounter for chronic pain management 04/15/2018  . Anxiety and depression 04/15/2018  . 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 Cancer of tonsillar fossa ([place]) 09/20/2017  . Bruising 10/02/2016  . 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 Aneurysm of infrarenal abdominal aorta (HCC) 07/10/2016  . Normocytic anemia 07/10/2016  . Tobacco abuse disorder 01/10/2014  . Seasonal allergies 11/23/2013  . Essential hypertension 05/19/2013  . Osteoarthritis 05/29/2009  . 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 Aortic valve disorder 01/28/2008  . BRAIN STEM STROKE 08/27/2007  . 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 BENIGN PROSTATIC HYPERTROPHY, HX OF 08/27/2007  . Hyperlipidemia 03/19/2007  . Anxiety state 03/19/2007  . GERD 03/19/2007   Immunization History  Administered Date(s) Administered  . Fluad Quad(high Dose 65+) 10/15/2018, 11/06/2019  . Influenza Split 11/26/2010, 10/11/2011, 11/03/2012  . Influenza Whole 11/14/2006, 12/04/2007, 10/06/2008, 10/25/2009  . Influenza, High Dose Seasonal PF 11/01/2016, 10/30/2017  . Influenza, Seasonal, Injecte, Preservative Fre 12/07/2014  . Influenza,inj,Quad PF,6+ Mos 11/04/2015  . Influenza-Unspecified 11/19/2013  . PFIZER(Purple Top)SARS-COV-2 Vaccination 02/25/2019, 03/18/2019, 11/16/2019  . Pneumococcal Conjugate-13 12/07/2014  . Pneumococcal Polysaccharide-23 05/30/2017  . Tdap 01/28/2020    Conditions to be addressed/monitored:  Hypertension, Hyperlipidemia and GERD Care Plan : [place]  Updates made by Madelin Rear, [facility] since 03/24/2020 12:00 AM    Problem: HLD HTN GERD     Long-Range Goal: Disease management    Start Date: 03/24/2020  Expected End Date: 03/24/2021  This Visit's Progress: On track  Priority: High  Note:   Medication Assistance: None required.  Patient affirms current coverage meets needs. Patient's preferred pharmacy is:  [facility] [place], [address] [place] [address] Phone: [phone] Fax: [place],  [place] [address] [place] [address] Phone: [phone] Fax: [phone]  Uses pill box? No. Pt endorses 100% compliance. We discussed: Current pharmacy is preferred with insurance plan and patient is satisfied with pharmacy services. Patient decided to: Continue current medication management strategy Patient agrees to Care Plan and Follow-up.  Current Barriers:  . Per patient is concerned of BP management  Pharmacist Clinical Goal(s):  Marland Kitchen Over the next 365 days, patient will verbalize ability to afford treatment regimen . contact provider office for questions/concerns as evidenced notation of same in electronic health record through collaboration with PharmD and provider.   Interventions: . 1:1 collaboration with Brunetta Jeans, PA-C regarding development and update of comprehensive plan of care as evidenced by provider attestation and co-signature . Inter-disciplinary care team collaboration (see longitudinal plan of care) . Comprehensive medication review performed; medication list updated in electronic medical record  Hypertension (BP goal <130/80) -controlled  -echo later today 03/24/2020 with Dr Aundra Dubin -Current treatment: . Amlodipine 10 mg once daily . Isosorbide mononitrate 30 mg every day at bedtime . Lisinopril 10 mg once daily  . Metoprolol 25 mg tablet - Take 0.5 tablets (12.5 mg total) by mouth 2 (two) times daily -Current home readings: feels BP may be slightly  elevated, some fluctuations during recent OVs.  -Current dietary habits: see HLD -Current exercise habits: see HLD -Denies hypotensive/hypertensive symptoms -Educated on BP goals and benefits of medications for prevention of heart attack, stroke and kidney damage; Daily salt intake goal < 2300 mg; Exercise goal of 150 minutes per week; Importance of home blood pressure monitoring; Symptoms of hypotension and importance of maintaining adequate hydration; -Counseled to monitor BP at home at least once every 1-2 weeks, document, and provide log at future appointments -Counseled on diet and exercise extensively Recommended to continue current medication  Hyperlipidemia: (LDL goal < 70) -controlled  -TIA Hx, aortic stenosis. On ASA for secondary prevention.  -Current treatment: . Atorvastatin 40 mg once daily (Dr Aundra Dubin) -Medications previously tried: pitavastatin 4 mg, simvastatin 20 mg, pravastatin 80 mg, omega 3 (Lovaza)  -Current dietary habits: not reviewed outside of reflux triggers.  -Current exercise habits: staying active, walking but not formally, works out in [employer]. Feels now that pain has subsided is able to stay active outdoors. Staying on feet most of the day working out in [employer] and walking around. No formal exercise.  -Educated on Cholesterol goals;  Benefits of statin for ASCVD risk reduction; Importance of limiting foods high in cholesterol; Exercise goal of 150 minutes per week; -Counseled on diet and exercise extensively Recommended to continue current medication  Bone health (Goal ensure preventative screen as appropriate) --chronic SSRI PPI. Long term steroid use, future taper uncertain -Last DEXA Scan: none on file - per Dr Estanislado Pandy, would consider at next f/u visit. -Patient is not a candidate for pharmacologic treatment -Current treatment: n/a -Recommend f/u with Dr Estanislado Pandy as appropriate, discuss DEXA.   GERD (Goal: ensure medication  safety/accessibility) - previously seen by Carl Best -controlled -triggers identified: over eating, eating late at night, chocolate  -Current treatment  . Omeprazole 20 mg once daily -Medications previously tried: pantoprazole, ranitidine, famotidine.   -Assessed reflux regimen - on steroid, pt would consider stopping PPI and retrial of famotidine OTC 20 mg BID PRN.  Patient Goals/Self-Care Activities . Over the next 365 days, patient will:  - take medications as prescribed  target a minimum of 150 minutes of moderate intensity exercise weekly engage in dietary modifications by avoiding reflux triggers as discussed      Future Appointments  Date Time Provider [place]  03/24/2020  1:00 PM Madelin Rear, [facility] [place]  03/24/2020  2:15 PM [facility] ECHO OP 2 [facility]  03/24/2020  3:20 PM Larey Dresser, MD [facility] None  03/25/2020 10:20 AM Eppie Gibson, MD [facility] None  04/19/2020 11:00 AM Haydee Salter, MD [facility]  05/11/2020 10:40 AM Ofilia Neas, PA-C [facility] None   Follow-up plan with Care Management Team: . CPA: n/a . RPH: TOC-[place], TOC visit 04/19/2020  Madelin Rear, Pharm.D., BCGP Clinical Pharmacist [facility] [place] [phone]

## 2020-03-24 NOTE — Patient Instructions (Signed)
Mr. Vanvranken,  Thank you for taking the time to review your medications with me today.  I have included our care plan/goals in the following pages. Please review and call me at [phone] with any questions!  Thanks! Ellin Mayhew, Pharm.D., BCGP Clinical Pharmacist [facility] [place] [phone] There are no care plans to display for this patient.    The patient verbalized understanding of instructions provided today and agreed to receive a MyChart copy of patient instruction and/or educational materials. Telephone follow up appointment with pharmacy team member scheduled for: See next appointment with "Care Management Staff" under "What's Next" below.

## 2020-03-24 NOTE — Progress Notes (Signed)
  Echocardiogram 2D Echocardiogram has been performed.  Brandon [name] 03/24/2020, 3:13 PM

## 2020-03-24 NOTE — Patient Instructions (Addendum)
Labs done today. We will contact you only if your labs are abnormal.  STOP taking Metoprolol   START taking Carvedilol 6.25mg  (1 tablet) by mouth 2 times daily.  No other medication changes were made. Please continue all current medications as prescribed.  You have been referred to [facility] to see Dr. Burt Knack and to [facility]. Both offices will contact you to schedule an appointment.   Your physician has requested that you have a carotid duplex. This test is an ultrasound of the carotid arteries in your neck. It looks at blood flow through these arteries that supply the brain with blood. Allow one hour for this exam. There are no restrictions or special instructions. This has to be approved through your insurance prior to scheduling; once approved we will contact you to schedule an appointment.   Your physician recommends that you schedule a follow-up appointment in: 2 months   If you have any questions or concerns before your next appointment please send [place] a message through [place] or call our office at [phone].    TO LEAVE A MESSAGE FOR THE NURSE SELECT OPTION 2, PLEASE LEAVE A MESSAGE INCLUDING: . YOUR NAME . DATE OF BIRTH . CALL BACK NUMBER . REASON FOR CALL**this is important as we prioritize the call backs  YOU WILL RECEIVE A CALL BACK THE SAME DAY AS LONG AS YOU CALL BEFORE 4:00 PM   Do the following things EVERYDAY: 1) Weigh yourself in the morning before breakfast. Write it down and keep it in a log. 2) Take your medicines as prescribed 3) Eat low salt foods--Limit salt (sodium) to 2000 mg per day.  4) Stay as active as you can everyday 5) Limit all fluids for the day to less than 2 liters   At the [facility], you and your health needs are our priority. As part of our continuing mission to provide you with exceptional heart care, we have created designated Provider Care Teams. These Care Teams include your primary  Cardiologist (physician) and Advanced Practice Providers (APPs- Physician Assistants and Nurse Practitioners) who all work together to provide you with the care you need, when you need it.   You may see any of the following providers on your designated Care Team at your next follow up: Marland Kitchen Dr Glori Bickers . Dr Loralie Champagne . Darrick Grinder, NP . Lyda Jester, PA . Audry Riles, PharmD   Please be sure to bring in all your medications bottles to every appointment.

## 2020-03-25 ENCOUNTER — Other Ambulatory Visit: Payer: Self-pay

## 2020-03-25 ENCOUNTER — Ambulatory Visit
Admission: RE | Admit: 2020-03-25 | Discharge: 2020-03-25 | Disposition: A | Discharge status: Home / Self Care | Payer: Medicare Other | Source: Ambulatory Visit | Attending: Radiation Oncology | Admitting: Radiation Oncology

## 2020-03-25 ENCOUNTER — Other Ambulatory Visit (HOSPITAL_COMMUNITY): Payer: Self-pay | Admitting: Cardiology

## 2020-03-25 ENCOUNTER — Ambulatory Visit (HOSPITAL_BASED_OUTPATIENT_CLINIC_OR_DEPARTMENT_OTHER)
Admission: RE | Admit: 2020-03-25 | Discharge: 2020-03-25 | Disposition: A | Discharge status: Home / Self Care | Payer: Medicare Other | Source: Ambulatory Visit | Attending: Cardiology | Admitting: Cardiology

## 2020-03-25 VITALS — BP 150/69 | HR 87 | Temp 98.2°F | Resp 18 | Ht 67.0 in | Wt 173.0 lb

## 2020-03-25 DIAGNOSIS — Z85819 Personal history of malignant neoplasm of unspecified site of lip, oral cavity, and pharynx: Secondary | ICD-10-CM | POA: Diagnosis not present

## 2020-03-25 DIAGNOSIS — Z85818 Personal history of malignant neoplasm of other sites of lip, oral cavity, and pharynx: Secondary | ICD-10-CM | POA: Insufficient documentation

## 2020-03-25 DIAGNOSIS — C090 Malignant neoplasm of tonsillar fossa: Secondary | ICD-10-CM

## 2020-03-25 DIAGNOSIS — Z08 Encounter for follow-up examination after completed treatment for malignant neoplasm: Secondary | ICD-10-CM | POA: Diagnosis not present

## 2020-03-25 DIAGNOSIS — I6523 Occlusion and stenosis of bilateral carotid arteries: Secondary | ICD-10-CM

## 2020-03-25 DIAGNOSIS — Z8673 Personal history of transient ischemic attack (TIA), and cerebral infarction without residual deficits: Secondary | ICD-10-CM | POA: Diagnosis not present

## 2020-03-25 DIAGNOSIS — Z923 Personal history of irradiation: Secondary | ICD-10-CM | POA: Diagnosis not present

## 2020-03-25 DIAGNOSIS — Z87891 Personal history of nicotine dependence: Secondary | ICD-10-CM | POA: Diagnosis not present

## 2020-03-25 DIAGNOSIS — Z79899 Other long term (current) drug therapy: Secondary | ICD-10-CM | POA: Insufficient documentation

## 2020-03-25 LAB — TSH: TSH: 1.569 u[IU]/mL (ref 0.320–4.118)

## 2020-03-25 NOTE — H&P (View-Only) (Signed)
Patient ID: Brandon [name], male   DOB: [birth year], 80 y.o.   MRN: [id number] PCP: Elyn Aquas Cardiology: Dr. Aundra Dubin  80 y.o.with history of CAD, HTN, and hyperlipidemia presents for followup.  Patient had described some exertional chest pain and myoview showed a partially reversible inferior defect, so LHC was done in 7/10.  This showed that his proximal RCA was totally occluded.  There were very robust left to right collaterals.  There was also a 50% proximal LAD stenosis.  The patient was managed medically.    Most recent echo in 5/20 showed EF 65-70% with moderate AS and mild AI.     Earlier in 2018, he developed a Strep intermedius liver abscess.  He had a hepatic drain placed and also had a right pleural effusion requiring diuresis.    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  He is now cancer-free.    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 Abdominal US in 2/22 showed stable 4.8 cm AAA.   Echo was done today and reviewed, EF 60-65% with mild LVH, normal RV, PASP 45 mmHg, severe AS with mean gradient 50 mmHg and AVA 0.96 cm^2.   Patient returns today for followup of CAD and aortic stenosis. He has been diagnosed with polymyalgia rheumatica and is taking prednisone, BP has been higher since starting prednisone.  He has noted shortness of breath and chest pressure with heavy exertion like pulling his garbage cans to the street and or lifting something heavy.  This is a change from the past.  He can walk on flat ground without dyspnea.  No lightheadedness or syncope.    ECG: NSR, nonspecific T wave flattening (personally reviewed)  Labs (12/10): HDL 28, LDL 70, K 4.3, creatinine 0.8 Labs (4/11): K 4, creatinine 0.8, LDL 53, HDL 26, LFTs normal Labs (12/11): LDL 74, HDL 27, K 4, creatinine 0.9, LFTs normal, TSH normal Labs (7/12): LDL 87, HDL 33 Labs (9/12): LDL 66, HDL 33, LFTs normal Labs (9/13): LDL 62, HDL 30, K 4.1, creatinine 0.8 Labs  (4/14): LDL 88, HDL 27, K 4.4, creatinine 0.8 Labs (4/15): K 4.4, creatinine 0.76 Labs (4/16): LDL 69, HDL 32 Labs (11/16): K 4.3, creatinine 0.76, LDL 68, HDL 31 Labs (1/18): LDL 72 Labs (6/18): K 4.2, creatinine 0.6, LFTs normal, hgb 8.1.  Labs (9/18): K 4, creatinine 0.6, LDL 79, HDL 36 Labs (1/19): K 3.9, creatinine 0.67 Labs (8/21): K 3.7, creatinine 0.78  Allergies (verified):  1)  ! Celebrex  Past Medical History: 1. Hyperlipidemia: Myalgias with atorvastatin and Crestor. 2. Aortic stenosis.  Echocardiogram in August 2009 showed EF 60%, no regional wall motion abnormalities, mild LVH, moderate focal basal septal hypertrophy, and mild diastolic dysfunction.  There was a very mild aortic stenosis with partially fused left and right coronary cusps and some restricted motion of the aortic valve.  The mean gradient, however, across the aortic valve was only 8 mmHg.  There was also mild left atrial enlargement and normal RV size and function.  Minimal AS on LHC in 7/10.  Echo (7/14) with EF 60-65%, mild LVH, mild AS mean gradient 13 mmHg.  Echo (6/16) with EF 55-60%, mild AS with mean gradient 14 mmHg.  - Echo (6/18): EF 55-60%, mild LVH, moderate AS mean gradient 27 mmHg, mild AI.   - Echo (5/20, [facility]): EF 65-70%, moderate AS, mild AI.  - Echo (2/22): EF 60-65% with mild LVH, normal RV, PASP  45 mmHg, severe AS with mean gradient 50 mmHg and AVA 0.96 cm^2.  3. Hypertension. 4. Gastroesophageal reflux disease: h/o esophageal stricture.  5. BPH. 6. Diverticulosis. 7. Smoked until 7/10. 8. CAD: LHC (7/10) showed totally occluded proximal RCA with very robust left to right collaterals, 50% proximal LAD stenosis, EF 65%.  Medical management.   9. Osteoarthritis 10. Colonic polyps 11. h/o TIA 12. 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  This manifested with slurred speech. 13. History of low back pain.  The patient is status post surgical back fusion. 14. History of  cholecystectomy. 15. Cataract surgery 16. Prostate cancer: treated 71. Liver abscess: Strep intermedius, 2018.  18. AAA: Noted in 8/18 on CT abdomen, 4.3 cm.  - Abdominal US (10/20): 4.8 cm AAA.  - Abdominal US (2/22): 4.8 cm AAA 19. Carotid stenosis: carotid dopplers 10/18 with 40-59% RICA stenosis.  - Carotid dopplers (10/20): 50-69% RICA stenosis.   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   Family History: Father alive w/ CAD & CABG at [facility] in 2009, had TAVR as well.  Mother died age 61 w/ Alzheimer's disease, hx of DJD 1 Sibling- Brother alive & well w/ DJD in his hips  Social History: Married- 87rd marriage, wife= Etta 2 children from 1st marriage, 4 step children Former smoker, 40+yrs of >1ppd, quit 7/10.  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    Current Outpatient Medications  Medication Sig Dispense Refill  . amLODipine (NORVASC) 10 MG tablet Take 1 tablet (10 mg total) by mouth daily. 90 tablet 3  . aspirin 81 MG EC tablet Take 81 mg by mouth at bedtime.    Marland Kitchen atorvastatin (LIPITOR) 40 MG tablet TAKE 1 TABLET BY MOUTH EVERY DAY (Patient taking differently: Take 40 mg by mouth daily.) 90 tablet 1  . carvedilol (COREG) 6.25 MG tablet Take 1 tablet (6.25 mg total) by mouth 2 (two) times daily. 60 tablet 11  . gabapentin (NEURONTIN) 300 MG capsule TAKE 1 CAPSULE(300 MG) BY MOUTH THREE TIMES DAILY (Patient taking differently: Take 300 mg by mouth 2 (two) times daily.) 90 capsule 5  . isosorbide mononitrate (IMDUR) 30 MG 24 hr tablet TAKE 1 TABLET(30 MG) BY MOUTH AT BEDTIME (Patient taking differently: Take 30 mg by mouth daily.) 90 tablet 1  . lisinopril (ZESTRIL) 10 MG tablet TAKE 1 TABLET(10 MG) BY MOUTH DAILY (Patient taking  differently: Take 10 mg by mouth daily.) 90 tablet 3  . omeprazole (PRILOSEC) 20 MG capsule TAKE 1 CAPSULE(20 MG) BY MOUTH DAILY (Patient taking differently: Take 20 mg by mouth daily.) 90 capsule 2  . oxyCODONE (OXY IR/ROXICODONE) 5 MG immediate release tablet Take 1-2 tablets (5-10 mg total) by mouth every 6 (six) hours as needed for severe pain (to take 2 tabs ONLY when severe pain- which is not usual for pt). 120 tablet 0  . predniSONE (DELTASONE) 1 MG tablet Take 4 tablets (4 mg total) by mouth daily with breakfast. Take with one 5 mg tablet to equal 9 mg daily. (Patient taking differently: Take 7 mg by mouth daily with breakfast. Taper) 120 tablet 0  . tamsulosin (FLOMAX) 0.4 MG CAPS capsule Take 0.4 mg by mouth daily.     . traMADol (ULTRAM) 50 MG tablet Take 2 tablets (100 mg total)  by mouth 2 (two) times daily. (Patient taking differently: Take 100 mg by mouth daily as needed for moderate pain or severe pain.) 120 tablet 5  . citalopram (CELEXA) 20 MG tablet Take 1 tablet (20 mg total) by mouth daily. 30 tablet 5  . fluticasone (FLONASE) 50 MCG/ACT nasal spray Place 2 sprays into both nostrils daily as needed for rhinitis or allergies.    . Nutritional Supplements (PROTEIN SUPPLEMENT 80% PO) Take 11 oz by mouth daily. Premier Protein chocolate     No current facility-administered medications for this encounter.    BP (!) 144/78   Pulse 74   Wt 78.7 kg (173 lb 9.6 oz)   SpO2 96%   BMI 26.40 kg/m  General: NAD Neck: No JVD, no thyromegaly or thyroid nodule.  Lungs: Clear to auscultation bilaterally with normal respiratory effort. CV: Nondisplaced PMI.  Heart regular S1/S2, no S3/S4, 3/6 SEM RUSB with obscured S2.  No peripheral edema.  No carotid bruit.  Normal pedal pulses.  Abdomen: Soft, nontender, no hepatosplenomegaly, no distention.  Skin: Intact without lesions or rashes.  Neurologic: Alert and oriented x 3.  Psych: Normal affect. Extremities: No clubbing or cyanosis.   HEENT: Normal.   Assessment/Plan:  1. Aortic stenosis: This has progressed, clearly severe on today's echo.  I think AS is symptomatic, he has chest heaviness and dyspnea with moderate-heavy exertion.  - Refer for TAVR evaluation.  He is familiar TAVR as his father had the procedure when in his 63s.  - I will arrange for LHC/RHC to assess coronaries and filling pressures prior to TAVR.   I discussed risks/benefits with patient and he agrees to procedure.  2. CAD: He has exertional chest pressure that may be related to severe AS but cannot rule out worsening CAD.  Patient has known CTO RCA from cath in 2010.  - Continue atorvastatin, check lipids today.  - Continue ASA 81 daily as above.  - Coronary angiography as noted above pre-TAVR.  3. Hyperlipidemia:   - Check lipids today.   4. HTN: BP higher on prednisone (for PMR).   - Continue amlodipine and lisinopril.  BMET today.  - Stop metoprolol and start Coreg 6.25 mg bid for better BP control.  5. Abdominal aortic aneurysm: 4.8 cm by [place] in 2/22.   - Refer for vascular surgery evaluation.   6. Carotid stenosis: Arrange for repeat carotid dopplers.    Followup in 2 months.    Loralie Champagne 03/25/2020

## 2020-03-25 NOTE — Progress Notes (Signed)
Brandon [name] presents for follow up of radiation completed on 03/05/2018 to his left tonsil and left neck nodes.  Pain issues, if any: Denies any neck/throat pain. Reports his arthritis seems to be better controlled with new medication regimen Using a feeding tube?: N/A Weight changes, if any:  Wt Readings from Last 3 Encounters:  03/25/20 173 lb (78.5 kg)  03/24/20 173 lb 9.6 oz (78.7 kg)  02/15/20 172 lb (78 kg)   Swallowing issues, if any: Patient denies--feels he can eat and drink a wide variety of foods/beverages. He does occasionally feel like liquids go up his nose/sinuses  Smoking or chewing tobacco? None Using fluoride trays daily? N/A (full set of dentures) Last ENT visit was on: 12/15/2019 Saw Dr. 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 He is s/p transcervical / transhyoid resection with left neck dissection on 12/02/17. Pathology with a 4.0 cm tumor, 3.3 cm depth of invasion, positive margin, +PNI, +LVI, 0/32 nodes. He underwent postop RT, completed 03/05/18. He underwent release of the tongue from gingiva on 07/07/18.  --There is no evidence of disease on exam today.  --Return in April, certainly sooner if there are any questions or problems."  Other notable issues, if any: Continues to deal with occasional dry mouth (manages with Biotene). Reports constant sinus drainage and coughing up phlegm. Denies any symptoms of lymphedema or swelling to his neck. Reports continued discomfort to the left side of his neck from his surgery, and states it can limit his range of motion. Reports mild fatigue, but he attributes that to his heart murmur and needing a valve a replacement (states he's scheduled for surgery in the next few weeks)  Vitals:   03/25/20 1028  BP: (!) 150/69  Pulse: 87  Resp: 18  Temp: 98.2 F (36.8 C)  SpO2: 97%

## 2020-03-25 NOTE — Progress Notes (Signed)
Patient ID: Brandon [name], male   DOB: [birth year], 80 y.o.   MRN: [id number] PCP: Elyn Aquas Cardiology: Dr. Aundra Dubin  80 y.o.with history of CAD, HTN, and hyperlipidemia presents for followup.  Patient had described some exertional chest pain and myoview showed a partially reversible inferior defect, so LHC was done in 7/10.  This showed that his proximal RCA was totally occluded.  There were very robust left to right collaterals.  There was also a 50% proximal LAD stenosis.  The patient was managed medically.    Most recent echo in 5/20 showed EF 65-70% with moderate AS and mild AI.     Earlier in 2018, he developed a Strep intermedius liver abscess.  He had a hepatic drain placed and also had a right pleural effusion requiring diuresis.    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  He is now cancer-free.    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 Abdominal US in 2/22 showed stable 4.8 cm AAA.   Echo was done today and reviewed, EF 60-65% with mild LVH, normal RV, PASP 45 mmHg, severe AS with mean gradient 50 mmHg and AVA 0.96 cm^2.   Patient returns today for followup of CAD and aortic stenosis. He has been diagnosed with polymyalgia rheumatica and is taking prednisone, BP has been higher since starting prednisone.  He has noted shortness of breath and chest pressure with heavy exertion like pulling his garbage cans to the street and or lifting something heavy.  This is a change from the past.  He can walk on flat ground without dyspnea.  No lightheadedness or syncope.    ECG: NSR, nonspecific T wave flattening (personally reviewed)  Labs (12/10): HDL 28, LDL 70, K 4.3, creatinine 0.8 Labs (4/11): K 4, creatinine 0.8, LDL 53, HDL 26, LFTs normal Labs (12/11): LDL 74, HDL 27, K 4, creatinine 0.9, LFTs normal, TSH normal Labs (7/12): LDL 87, HDL 33 Labs (9/12): LDL 66, HDL 33, LFTs normal Labs (9/13): LDL 62, HDL 30, K 4.1, creatinine 0.8 Labs  (4/14): LDL 88, HDL 27, K 4.4, creatinine 0.8 Labs (4/15): K 4.4, creatinine 0.76 Labs (4/16): LDL 69, HDL 32 Labs (11/16): K 4.3, creatinine 0.76, LDL 68, HDL 31 Labs (1/18): LDL 72 Labs (6/18): K 4.2, creatinine 0.6, LFTs normal, hgb 8.1.  Labs (9/18): K 4, creatinine 0.6, LDL 79, HDL 36 Labs (1/19): K 3.9, creatinine 0.67 Labs (8/21): K 3.7, creatinine 0.78  Allergies (verified):  1)  ! Celebrex  Past Medical History: 1. Hyperlipidemia: Myalgias with atorvastatin and Crestor. 2. Aortic stenosis.  Echocardiogram in August 2009 showed EF 60%, no regional wall motion abnormalities, mild LVH, moderate focal basal septal hypertrophy, and mild diastolic dysfunction.  There was a very mild aortic stenosis with partially fused left and right coronary cusps and some restricted motion of the aortic valve.  The mean gradient, however, across the aortic valve was only 8 mmHg.  There was also mild left atrial enlargement and normal RV size and function.  Minimal AS on LHC in 7/10.  Echo (7/14) with EF 60-65%, mild LVH, mild AS mean gradient 13 mmHg.  Echo (6/16) with EF 55-60%, mild AS with mean gradient 14 mmHg.  - Echo (6/18): EF 55-60%, mild LVH, moderate AS mean gradient 27 mmHg, mild AI.   - Echo (5/20, [facility]): EF 65-70%, moderate AS, mild AI.  - Echo (2/22): EF 60-65% with mild LVH, normal RV, PASP  45 mmHg, severe AS with mean gradient 50 mmHg and AVA 0.96 cm^2.  3. Hypertension. 4. Gastroesophageal reflux disease: h/o esophageal stricture.  5. BPH. 6. Diverticulosis. 7. Smoked until 7/10. 8. CAD: LHC (7/10) showed totally occluded proximal RCA with very robust left to right collaterals, 50% proximal LAD stenosis, EF 65%.  Medical management.   9. Osteoarthritis 10. Colonic polyps 11. h/o TIA 12. 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  This manifested with slurred speech. 13. History of low back pain.  The patient is status post surgical back fusion. 14. History of  cholecystectomy. 15. Cataract surgery 16. Prostate cancer: treated 79. Liver abscess: Strep intermedius, 2018.  18. AAA: Noted in 8/18 on CT abdomen, 4.3 cm.  - Abdominal US (10/20): 4.8 cm AAA.  - Abdominal US (2/22): 4.8 cm AAA 19. Carotid stenosis: carotid dopplers 10/18 with 40-59% RICA stenosis.  - Carotid dopplers (10/20): 50-69% RICA stenosis.   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   Family History: Father alive w/ CAD & CABG at [facility] in 2009, had TAVR as well.  Mother died age 17 w/ Alzheimer's disease, hx of DJD 1 Sibling- Brother alive & well w/ DJD in his hips  Social History: Married- 45rd marriage, wife= Etta 2 children from 1st marriage, 4 step children Former smoker, 40+yrs of >1ppd, quit 7/10.  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    Current Outpatient Medications  Medication Sig Dispense Refill  . amLODipine (NORVASC) 10 MG tablet Take 1 tablet (10 mg total) by mouth daily. 90 tablet 3  . aspirin 81 MG EC tablet Take 81 mg by mouth at bedtime.    Marland Kitchen atorvastatin (LIPITOR) 40 MG tablet TAKE 1 TABLET BY MOUTH EVERY DAY (Patient taking differently: Take 40 mg by mouth daily.) 90 tablet 1  . carvedilol (COREG) 6.25 MG tablet Take 1 tablet (6.25 mg total) by mouth 2 (two) times daily. 60 tablet 11  . gabapentin (NEURONTIN) 300 MG capsule TAKE 1 CAPSULE(300 MG) BY MOUTH THREE TIMES DAILY (Patient taking differently: Take 300 mg by mouth 2 (two) times daily.) 90 capsule 5  . isosorbide mononitrate (IMDUR) 30 MG 24 hr tablet TAKE 1 TABLET(30 MG) BY MOUTH AT BEDTIME (Patient taking differently: Take 30 mg by mouth daily.) 90 tablet 1  . lisinopril (ZESTRIL) 10 MG tablet TAKE 1 TABLET(10 MG) BY MOUTH DAILY (Patient taking  differently: Take 10 mg by mouth daily.) 90 tablet 3  . omeprazole (PRILOSEC) 20 MG capsule TAKE 1 CAPSULE(20 MG) BY MOUTH DAILY (Patient taking differently: Take 20 mg by mouth daily.) 90 capsule 2  . oxyCODONE (OXY IR/ROXICODONE) 5 MG immediate release tablet Take 1-2 tablets (5-10 mg total) by mouth every 6 (six) hours as needed for severe pain (to take 2 tabs ONLY when severe pain- which is not usual for pt). 120 tablet 0  . predniSONE (DELTASONE) 1 MG tablet Take 4 tablets (4 mg total) by mouth daily with breakfast. Take with one 5 mg tablet to equal 9 mg daily. (Patient taking differently: Take 7 mg by mouth daily with breakfast. Taper) 120 tablet 0  . tamsulosin (FLOMAX) 0.4 MG CAPS capsule Take 0.4 mg by mouth daily.     . traMADol (ULTRAM) 50 MG tablet Take 2 tablets (100 mg total)  by mouth 2 (two) times daily. (Patient taking differently: Take 100 mg by mouth daily as needed for moderate pain or severe pain.) 120 tablet 5  . citalopram (CELEXA) 20 MG tablet Take 1 tablet (20 mg total) by mouth daily. 30 tablet 5  . fluticasone (FLONASE) 50 MCG/ACT nasal spray Place 2 sprays into both nostrils daily as needed for rhinitis or allergies.    . Nutritional Supplements (PROTEIN SUPPLEMENT 80% PO) Take 11 oz by mouth daily. Premier Protein chocolate     No current facility-administered medications for this encounter.    BP (!) 144/78   Pulse 74   Wt 78.7 kg (173 lb 9.6 oz)   SpO2 96%   BMI 26.40 kg/m  General: NAD Neck: No JVD, no thyromegaly or thyroid nodule.  Lungs: Clear to auscultation bilaterally with normal respiratory effort. CV: Nondisplaced PMI.  Heart regular S1/S2, no S3/S4, 3/6 SEM RUSB with obscured S2.  No peripheral edema.  No carotid bruit.  Normal pedal pulses.  Abdomen: Soft, nontender, no hepatosplenomegaly, no distention.  Skin: Intact without lesions or rashes.  Neurologic: Alert and oriented x 3.  Psych: Normal affect. Extremities: No clubbing or cyanosis.   HEENT: Normal.   Assessment/Plan:  1. Aortic stenosis: This has progressed, clearly severe on today's echo.  I think AS is symptomatic, he has chest heaviness and dyspnea with moderate-heavy exertion.  - Refer for TAVR evaluation.  He is familiar TAVR as his father had the procedure when in his 61s.  - I will arrange for LHC/RHC to assess coronaries and filling pressures prior to TAVR.   I discussed risks/benefits with patient and he agrees to procedure.  2. CAD: He has exertional chest pressure that may be related to severe AS but cannot rule out worsening CAD.  Patient has known CTO RCA from cath in 2010.  - Continue atorvastatin, check lipids today.  - Continue ASA 81 daily as above.  - Coronary angiography as noted above pre-TAVR.  3. Hyperlipidemia:   - Check lipids today.   4. HTN: BP higher on prednisone (for PMR).   - Continue amlodipine and lisinopril.  BMET today.  - Stop metoprolol and start Coreg 6.25 mg bid for better BP control.  5. Abdominal aortic aneurysm: 4.8 cm by [place] in 2/22.   - Refer for vascular surgery evaluation.   6. Carotid stenosis: Arrange for repeat carotid dopplers.    Followup in 2 months.    Loralie Champagne 03/25/2020

## 2020-03-28 ENCOUNTER — Other Ambulatory Visit (HOSPITAL_COMMUNITY): Payer: Self-pay | Admitting: *Deleted

## 2020-03-28 DIAGNOSIS — I350 Nonrheumatic aortic (valve) stenosis: Secondary | ICD-10-CM

## 2020-03-29 ENCOUNTER — Other Ambulatory Visit (HOSPITAL_COMMUNITY)
Admission: RE | Admit: 2020-03-29 | Discharge: 2020-03-29 | Disposition: A | Discharge status: Home / Self Care | Payer: Medicare Other | Source: Ambulatory Visit | Attending: Cardiology | Admitting: Cardiology

## 2020-03-29 DIAGNOSIS — Z01812 Encounter for preprocedural laboratory examination: Secondary | ICD-10-CM | POA: Diagnosis not present

## 2020-03-29 DIAGNOSIS — Z20822 Contact with and (suspected) exposure to covid-19: Secondary | ICD-10-CM | POA: Insufficient documentation

## 2020-03-29 LAB — SARS CORONAVIRUS 2 (TAT 6-24 HRS): SARS Coronavirus 2: NEGATIVE

## 2020-03-30 ENCOUNTER — Telehealth (HOSPITAL_COMMUNITY): Payer: Self-pay | Admitting: *Deleted

## 2020-03-30 ENCOUNTER — Encounter (HOSPITAL_COMMUNITY): Payer: Self-pay | Admitting: *Deleted

## 2020-03-30 NOTE — Telephone Encounter (Signed)
Pt called to report he lost his cath instructions and wanted to review what he needs to do  Reviewed instructions with pt, he is aware, agreeable, and verbalized understanding

## 2020-03-31 ENCOUNTER — Other Ambulatory Visit: Payer: Self-pay

## 2020-03-31 ENCOUNTER — Ambulatory Visit (HOSPITAL_COMMUNITY)
Admission: RE | Admit: 2020-03-31 | Discharge: 2020-03-31 | Disposition: A | Discharge status: Home / Self Care | Payer: Medicare Other | Attending: Cardiology | Admitting: Cardiology

## 2020-03-31 ENCOUNTER — Encounter: Payer: Self-pay | Admitting: Radiation Oncology

## 2020-03-31 ENCOUNTER — Encounter (HOSPITAL_COMMUNITY)
Admission: RE | Disposition: A | Discharge status: Home / Self Care | Payer: Self-pay | Source: Home / Self Care | Attending: Cardiology

## 2020-03-31 DIAGNOSIS — I6521 Occlusion and stenosis of right carotid artery: Secondary | ICD-10-CM | POA: Insufficient documentation

## 2020-03-31 DIAGNOSIS — Z87891 Personal history of nicotine dependence: Secondary | ICD-10-CM | POA: Insufficient documentation

## 2020-03-31 DIAGNOSIS — I350 Nonrheumatic aortic (valve) stenosis: Secondary | ICD-10-CM | POA: Insufficient documentation

## 2020-03-31 DIAGNOSIS — I2510 Atherosclerotic heart disease of native coronary artery without angina pectoris: Secondary | ICD-10-CM | POA: Diagnosis not present

## 2020-03-31 DIAGNOSIS — Z7982 Long term (current) use of aspirin: Secondary | ICD-10-CM | POA: Diagnosis not present

## 2020-03-31 DIAGNOSIS — Z79899 Other long term (current) drug therapy: Secondary | ICD-10-CM | POA: Diagnosis not present

## 2020-03-31 DIAGNOSIS — I714 Abdominal aortic aneurysm, without rupture: Secondary | ICD-10-CM | POA: Diagnosis not present

## 2020-03-31 DIAGNOSIS — I10 Essential (primary) hypertension: Secondary | ICD-10-CM | POA: Diagnosis not present

## 2020-03-31 DIAGNOSIS — E785 Hyperlipidemia, unspecified: Secondary | ICD-10-CM | POA: Insufficient documentation

## 2020-03-31 HISTORY — PX: RIGHT HEART CATH AND CORONARY ANGIOGRAPHY: CATH118264

## 2020-03-31 LAB — POCT I-STAT EG7
Acid-Base Excess: 6 mmol/L — ABNORMAL HIGH (ref 0.0–2.0)
Acid-Base Excess: 7 mmol/L — ABNORMAL HIGH (ref 0.0–2.0)
Bicarbonate: 32 mmol/L — ABNORMAL HIGH (ref 20.0–28.0)
Bicarbonate: 33.3 mmol/L — ABNORMAL HIGH (ref 20.0–28.0)
Calcium, Ion: 1.18 mmol/L (ref 1.15–1.40)
Calcium, Ion: 1.21 mmol/L (ref 1.15–1.40)
HCT: 34 % — ABNORMAL LOW (ref 39.0–52.0)
HCT: 34 % — ABNORMAL LOW (ref 39.0–52.0)
Hemoglobin: 11.6 g/dL — ABNORMAL LOW (ref 13.0–17.0)
Hemoglobin: 11.6 g/dL — ABNORMAL LOW (ref 13.0–17.0)
O2 Saturation: 73 %
O2 Saturation: 75 %
Potassium: 3.2 mmol/L — ABNORMAL LOW (ref 3.5–5.1)
Potassium: 3.2 mmol/L — ABNORMAL LOW (ref 3.5–5.1)
Sodium: 140 mmol/L (ref 135–145)
Sodium: 142 mmol/L (ref 135–145)
TCO2: 33 mmol/L — ABNORMAL HIGH (ref 22–32)
TCO2: 35 mmol/L — ABNORMAL HIGH (ref 22–32)
pCO2, Ven: 50.4 mmHg (ref 44.0–60.0)
pCO2, Ven: 52.5 mmHg (ref 44.0–60.0)
pH, Ven: 7.41 (ref 7.250–7.430)
pH, Ven: 7.411 (ref 7.250–7.430)
pO2, Ven: 39 mmHg (ref 32.0–45.0)
pO2, Ven: 41 mmHg (ref 32.0–45.0)

## 2020-03-31 SURGERY — RIGHT HEART CATH AND CORONARY ANGIOGRAPHY
Anesthesia: LOCAL

## 2020-03-31 MED ORDER — ASPIRIN 81 MG PO CHEW: 81.0000 mg | CHEWABLE_TABLET | Freq: Once | ORAL | Status: AC

## 2020-03-31 MED ORDER — LIDOCAINE HCL (PF) 1 % IJ SOLN
INTRAMUSCULAR | Status: AC
  Filled 2020-03-31: qty 30

## 2020-03-31 MED ORDER — HEPARIN (PORCINE) IN NACL 1000-0.9 UT/500ML-% IV SOLN
INTRAVENOUS | Status: AC
  Filled 2020-03-31: qty 1500

## 2020-03-31 MED ORDER — ASPIRIN 81 MG PO CHEW
CHEWABLE_TABLET | ORAL | Status: AC
  Administered 2020-03-31: 81 mg via ORAL
  Filled 2020-03-31: qty 1

## 2020-03-31 MED ORDER — SODIUM CHLORIDE 0.9% FLUSH: 3.0000 mL | INTRAVENOUS | Status: DC | PRN

## 2020-03-31 MED ORDER — HEPARIN SODIUM (PORCINE) 1000 UNIT/ML IJ SOLN
INTRAMUSCULAR | Status: DC | PRN
  Administered 2020-03-31: 4000 [IU] via INTRAVENOUS

## 2020-03-31 MED ORDER — HEPARIN (PORCINE) IN NACL 1000-0.9 UT/500ML-% IV SOLN
INTRAVENOUS | Status: DC | PRN
  Administered 2020-03-31: 500 mL

## 2020-03-31 MED ORDER — SODIUM CHLORIDE 0.9% FLUSH: 3.0000 mL | Freq: Two times a day (BID) | INTRAVENOUS | Status: DC

## 2020-03-31 MED ORDER — MIDAZOLAM HCL 2 MG/2ML IJ SOLN
INTRAMUSCULAR | Status: AC
  Filled 2020-03-31: qty 2

## 2020-03-31 MED ORDER — HYDRALAZINE HCL 20 MG/ML IJ SOLN: 10.0000 mg | INTRAMUSCULAR | Status: DC | PRN

## 2020-03-31 MED ORDER — IOHEXOL 350 MG/ML SOLN
INTRAVENOUS | Status: DC | PRN
  Administered 2020-03-31: 45 mL

## 2020-03-31 MED ORDER — SODIUM CHLORIDE 0.9 % IV SOLN: INTRAVENOUS | Status: DC

## 2020-03-31 MED ORDER — FENTANYL CITRATE (PF) 100 MCG/2ML IJ SOLN
INTRAMUSCULAR | Status: DC | PRN
  Administered 2020-03-31 (×2): 25 ug via INTRAVENOUS

## 2020-03-31 MED ORDER — VERAPAMIL HCL 2.5 MG/ML IV SOLN
INTRAVENOUS | Status: DC | PRN
  Administered 2020-03-31: 10 mL via INTRA_ARTERIAL

## 2020-03-31 MED ORDER — SODIUM CHLORIDE 0.9 % IV SOLN: 250.0000 mL | INTRAVENOUS | Status: DC | PRN

## 2020-03-31 MED ORDER — ONDANSETRON HCL 4 MG/2ML IJ SOLN: 4.0000 mg | Freq: Four times a day (QID) | INTRAMUSCULAR | Status: DC | PRN

## 2020-03-31 MED ORDER — MIDAZOLAM HCL 2 MG/2ML IJ SOLN
INTRAMUSCULAR | Status: DC | PRN
  Administered 2020-03-31 (×2): 1 mg via INTRAVENOUS

## 2020-03-31 MED ORDER — FENTANYL CITRATE (PF) 100 MCG/2ML IJ SOLN
INTRAMUSCULAR | Status: AC
  Filled 2020-03-31: qty 2

## 2020-03-31 MED ORDER — HEPARIN SODIUM (PORCINE) 1000 UNIT/ML IJ SOLN
INTRAMUSCULAR | Status: AC
  Filled 2020-03-31: qty 1

## 2020-03-31 MED ORDER — VERAPAMIL HCL 2.5 MG/ML IV SOLN
INTRAVENOUS | Status: AC
  Filled 2020-03-31: qty 2

## 2020-03-31 MED ORDER — LABETALOL HCL 5 MG/ML IV SOLN: 10.0000 mg | INTRAVENOUS | Status: DC | PRN

## 2020-03-31 MED ORDER — LIDOCAINE HCL (PF) 1 % IJ SOLN
INTRAMUSCULAR | Status: DC | PRN
  Administered 2020-03-31: 1 mL
  Administered 2020-03-31: 2 mL

## 2020-03-31 MED ORDER — SODIUM CHLORIDE 0.9 % IV SOLN: INTRAVENOUS | Status: AC

## 2020-03-31 MED ORDER — ATORVASTATIN CALCIUM 40 MG PO TABS: 80.0000 mg | ORAL_TABLET | Freq: Every day | ORAL | 1 refills | Status: DC

## 2020-03-31 MED ORDER — ACETAMINOPHEN 325 MG PO TABS: 650.0000 mg | ORAL_TABLET | ORAL | Status: DC | PRN

## 2020-03-31 SURGICAL SUPPLY — 13 items
BAG SNAP BAND KOVER 36X36 (MISCELLANEOUS) ×1 IMPLANT
CATH 5FR JL3.5 JR4 ANG PIG MP (CATHETERS) ×1 IMPLANT
CATH BALLN WEDGE 5F 110CM (CATHETERS) ×1 IMPLANT
COVER DOME SNAP 22 D (MISCELLANEOUS) ×1 IMPLANT
DEVICE RAD COMP TR BAND LRG (VASCULAR PRODUCTS) ×1 IMPLANT
GLIDESHEATH SLEND SS 6F .021 (SHEATH) ×1 IMPLANT
GUIDEWIRE INQWIRE 1.5J.035X260 (WIRE) IMPLANT
INQWIRE 1.5J .035X260CM (WIRE) ×2
KIT HEART LEFT (KITS) ×2 IMPLANT
PACK CARDIAC CATHETERIZATION (CUSTOM PROCEDURE TRAY) ×2 IMPLANT
SHEATH GLIDE SLENDER 4/5FR (SHEATH) ×1 IMPLANT
TRANSDUCER W/STOPCOCK (MISCELLANEOUS) ×2 IMPLANT
WIRE HI TORQ VERSACORE-J 145CM (WIRE) ×1 IMPLANT

## 2020-03-31 NOTE — Progress Notes (Signed)
Radiation Oncology         [phone] ________________________________  Name: Brandon [name] MRN: [id number]  Date: 03/25/2020  DOB: 1940-11-11  Follow-Up outpatient note, in person  CC: [name], [name], MD  Jodi Marble, MD  Diagnosis and Prior Radiotherapy:       ICD-10-CM   1. 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 TSH  Cancer Staging Cancer of tonsillar fossa ([place]) Staging form: Pharynx - HPV-Mediated Oropharynx, AJCC 8th Edition - Clinical: Stage I (cT2, cN0, cM0, p16+) - Signed by Tish Men, MD on 11/06/2017 Laterality: Left - Pathologic: Stage II (pT3, pN0, cM0, p16+) - Signed by Eppie Gibson, MD on 01/01/2018  Radiation treatment dates:   01/16/2018-03/05/2018  Site/dose:  Left tonsil tumor bed and left neck nodes, total dose 66 Gy in 33 fractions  (post op)  CHIEF COMPLAINT:   throat cancer history  Narrative:      Brandon [name] presents for follow up of radiation completed on 03/05/2018 to his left tonsil and left neck nodes.  Pain issues, if any: Denies any neck/throat pain. Reports his arthritis seems to be better controlled with new medication regimen Using a feeding tube?: N/A Weight changes, if any:  Wt Readings from Last 3 Encounters:  03/25/20 173 lb (78.5 kg)  03/24/20 173 lb 9.6 oz (78.7 kg)  02/15/20 172 lb (78 kg)   Swallowing issues, if any: Patient denies--feels he can eat and drink a wide variety of foods/beverages. He does occasionally feel like liquids go up his nose/sinuses  Smoking or chewing tobacco? None Using fluoride trays daily? N/A (full set of dentures) Last ENT visit was on: 12/15/2019 Saw Dr. 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 He is s/p transcervical / transhyoid resection with left neck dissection on 12/02/17. Pathology with a 4.0 cm tumor, 3.3 cm depth of invasion, positive margin, +PNI, +LVI, 0/32 nodes. He underwent  postop RT, completed 03/05/18. He underwent release of the tongue from gingiva on 07/07/18.  --There is no evidence of disease on exam today.  --Return in April, certainly sooner if there are any questions or problems."  Other notable issues, if any: Continues to deal with occasional dry mouth (manages with Biotene). Reports constant sinus drainage and coughing up phlegm. Denies any symptoms of lymphedema or swelling to his neck. Reports continued discomfort to the left side of his neck from his surgery, and states it can limit his range of motion. Reports mild fatigue, but he attributes that to his heart murmur and needing a valve a replacement (states he's scheduled for surgery in the next few weeks)  Vitals:   03/25/20 1028  BP: (!) 150/69  Pulse: 87  Resp: 18  Temp: 98.2 F (36.8 C)  SpO2: 97%     ALLERGIES:  is allergic to celebrex [celecoxib].  Meds: Current Outpatient Medications  Medication Sig Dispense Refill  . amLODipine (NORVASC) 10 MG tablet Take 1 tablet (10 mg total) by mouth daily. 90 tablet 3  . aspirin 81 MG EC tablet Take 81 mg by mouth at bedtime.    Marland Kitchen atorvastatin (LIPITOR) 40 MG tablet TAKE 1 TABLET BY MOUTH EVERY DAY (Patient taking differently: Take 40 mg by mouth daily.) 90 tablet 1  . carvedilol (COREG) 6.25 MG tablet Take 1 tablet (6.25 mg total) by mouth 2 (two) times daily. 60 tablet 11  . citalopram (CELEXA) 20 MG tablet Take 1 tablet (  20 mg total) by mouth daily. 30 tablet 5  . fluticasone (FLONASE) 50 MCG/ACT nasal spray Place 2 sprays into both nostrils daily as needed for rhinitis or allergies.    Marland Kitchen gabapentin (NEURONTIN) 300 MG capsule TAKE 1 CAPSULE(300 MG) BY MOUTH THREE TIMES DAILY (Patient taking differently: Take 300 mg by mouth 2 (two) times daily.) 90 capsule 5  . isosorbide mononitrate (IMDUR) 30 MG 24 hr tablet TAKE 1 TABLET(30 MG) BY MOUTH AT BEDTIME (Patient taking differently: Take 30 mg by mouth daily.) 90 tablet 1  . lisinopril (ZESTRIL) 10  MG tablet TAKE 1 TABLET(10 MG) BY MOUTH DAILY (Patient taking differently: Take 10 mg by mouth daily.) 90 tablet 3  . Nutritional Supplements (PROTEIN SUPPLEMENT 80% PO) Take 11 oz by mouth daily. Premier Protein chocolate    . omeprazole (PRILOSEC) 20 MG capsule TAKE 1 CAPSULE(20 MG) BY MOUTH DAILY (Patient taking differently: Take 20 mg by mouth daily.) 90 capsule 2  . oxyCODONE (OXY IR/ROXICODONE) 5 MG immediate release tablet Take 1-2 tablets (5-10 mg total) by mouth every 6 (six) hours as needed for severe pain (to take 2 tabs ONLY when severe pain- which is not usual for pt). 120 tablet 0  . predniSONE (DELTASONE) 1 MG tablet Take 4 tablets (4 mg total) by mouth daily with breakfast. Take with one 5 mg tablet to equal 9 mg daily. (Patient taking differently: Take 7 mg by mouth daily with breakfast. Taper) 120 tablet 0  . tamsulosin (FLOMAX) 0.4 MG CAPS capsule Take 0.4 mg by mouth daily.     . traMADol (ULTRAM) 50 MG tablet Take 2 tablets (100 mg total) by mouth 2 (two) times daily. (Patient taking differently: Take 100 mg by mouth daily as needed for moderate pain or severe pain.) 120 tablet 5   No current facility-administered medications for this encounter.    Physical Findings:   Wt Readings from Last 3 Encounters:  03/25/20 173 lb (78.5 kg)  03/24/20 173 lb 9.6 oz (78.7 kg)  02/15/20 172 lb (78 kg)    height is 5\' 7"  (1.702 m) and weight is 173 lb (78.5 kg). His temperature is 98.2 F (36.8 C). His blood pressure is 150/69 (abnormal) and his pulse is 87. His respiration is 18 and oxygen saturation is 97%. Marland Kitchen   HEENT: No visible tumor in the mouth or oropharynx. No thrush. Neck: Moderate lymphedema and postoperative changes.  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 History of abdominal aortic aneurysm. EXAM: ABDOMEN ULTRASOUND COMPLETE COMPARISON:  Hepatic Doppler ultrasound-03/28/2017; aortic ultrasound-11/10/2018 Chest CT-05/29/2018; CT abdomen pelvis-02/26/2017 FINDINGS: Gallbladder: Surgically absent. Common bile duct: Diameter: Borderline enlarged measures 0.8 cm in diameter, presumably the sequela of post cholecystectomy state and biliary reservoir phenomena. Liver: There is mild diffuse slightly coarsened echogenicity of the hepatic parenchyma. No discrete intrahepatic biliary ductal dilatation. _________________________________________________________ Hepatic Doppler: Main portal vein size: 0.5 cm in diameter Portal vein velocities (normal hepatopetal directional flow) Main Dist: 25 cm/sec Right: 40 cm/sec Left: 27  cm/sec Hepatic Vein Velocities (Normal hepatofugal directional flow) Right:  27 cm/sec Middle:  44 cm/sec Left:  38 cm/sec IVC: Present and patent with normal respiratory phasicity. Hepatic Artery Velocity: 81 cm/sec Splenic Vein Velocity: 41 cm/sec Spleen: 7.9 cm x 6.0 cm x 13.3 cm with a total volume of 327 cm^3 (411 cm^3 is upper limit normal) Portal Vein Occlusion/Thrombus: No Splenic Vein Occlusion/Thrombus: No Ascites: None Varices: None _________________________________________________________ IVC: No abnormality visualized. Pancreas: Limited visualization of the pancreatic head and neck is normal. Visualization of the pancreatic body and tail is obscured by bowel gas. Spleen: Size and appearance within normal limits. Right Kidney: Normal cortical thickness, echogenicity and size, measuring 13.3 cm in length. No focal renal lesions. No echogenic renal stones. No urinary obstruction. Left Kidney: Normal  cortical thickness, echogenicity and size, measuring 15.4 cm in length. Note is made of a prominent renal column of Bertin. No focal renal lesions. No echogenic renal stones. No urinary obstruction. Abdominal aorta: Redemonstrated fusiform aneurysmal dilatation the abdominal aorta measuring approximately 4.8 x 4.5 cm, increased in size compared to abdominal CT performed 02/2017, previously, 4.3 x 4.0 cm _________________________________________________________ IMPRESSION: 1. 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 2. No evidence of portal venous hypertension. Specifically, no evidence of splenomegaly or intra-abdominal ascites. 3. Redemonstrated moderately heterogeneous and coarsened echogenicity of the hepatic parenchyma compatible with provided history of cirrhosis. No discrete hepatic lesions, however further evaluation with abdominal MRI could as indicated. 4. Suspected increase in size infrarenal abdominal aortic aneurysm, currently measuring 4.8 cm, previously, 4.3 cm when compared to abdominal CT performed 02/2017. If not previously performed, recommend vascular surgery referral/consultation and/or follow-up CT of the abdomen pelvis in 6 months. This recommendation follows ACR consensus guidelines: White Paper of the ACR Incidental Findings Committee II on Vascular Findings. J Am Coll Radiol 2013; 10:789-794. Electronically Signed   By: Sandi Mariscal M.D.   On: 03/17/2020 12:57   ECHOCARDIOGRAM COMPLETE  Result Date: 03/24/2020    ECHOCARDIOGRAM REPORT   Patient Name:   Brandon [name] Date of Exam: 03/24/2020 Medical Rec #:  [id number]    Height:       68.0 in Accession #:    [id number]   Weight:       172.0 lb Date of Birth:  Nov 20, 1940    BSA:          1.917 m Patient Age:    40 years     BP:            141/61 mmHg Patient Gender: M            HR:           65 bpm. Exam Location:  Outpatient Procedure: 2D Echo, Cardiac Doppler and Color Doppler Indications:    Congestive Heart Failure 428.0 / I50.9  History:        Patient has prior history of Echocardiogram examinations, most                 recent 07/18/2017. CAD, TIA, AS; Risk Factors:Hypertension and                 Dyslipidemia.  Sonographer:    Bernadene Person RDCS Referring Phys: [place]  1. Left ventricular ejection fraction, by estimation, is 60 to 65%. The left ventricle has normal  function. The left ventricle has no regional wall motion abnormalities. There is mild left ventricular hypertrophy. Left ventricular diastolic parameters are consistent with Grade II diastolic dysfunction (pseudonormalization).  2. Right ventricular systolic function is normal. The right ventricular size is normal. There is mildly elevated pulmonary artery systolic pressure. The estimated right ventricular systolic pressure is 16.1 mmHg.  3. Left atrial size was moderately dilated.  4. The mitral valve is degenerative. No evidence of mitral valve regurgitation. No evidence of mitral stenosis.  5. The aortic valve is tricuspid. Aortic valve regurgitation is mild. Severe aortic valve stenosis. Aortic valve area, by VTI measures 0.96 cm. Aortic valve mean gradient measures 50.0 mmHg.  6. Aortic dilatation noted. There is mild dilatation of the ascending aorta, measuring 42 mm.  7. The inferior vena cava is normal in size with greater than 50% respiratory variability, suggesting right atrial pressure of 3 mmHg. FINDINGS  Left Ventricle: Left ventricular ejection fraction, by estimation, is 60 to 65%. The left ventricle has normal function. The left ventricle has no regional wall motion abnormalities. The left ventricular internal cavity size was normal in size. There is  mild left ventricular hypertrophy. Left ventricular diastolic parameters are consistent  with Grade II diastolic dysfunction (pseudonormalization). Right Ventricle: The right ventricular size is normal. No increase in right ventricular wall thickness. Right ventricular systolic function is normal. There is mildly elevated pulmonary artery systolic pressure. The tricuspid regurgitant velocity is 3.22  m/s, and with an assumed right atrial pressure of 3 mmHg, the estimated right ventricular systolic pressure is 09.6 mmHg. Left Atrium: Left atrial size was moderately dilated. Right Atrium: Right atrial size was normal in size. Pericardium: There is no evidence of pericardial effusion. Mitral Valve: The mitral valve is degenerative in appearance. Mild to moderate mitral annular calcification. No evidence of mitral valve regurgitation. No evidence of mitral valve stenosis. Tricuspid Valve: The tricuspid valve is normal in structure. Tricuspid valve regurgitation is trivial. Aortic Valve: The aortic valve is tricuspid. Aortic valve regurgitation is mild. Aortic regurgitation PHT measures 658 msec. Severe aortic stenosis is present. Aortic valve mean gradient measures 50.0 mmHg. Aortic valve peak gradient measures 70.4 mmHg. Aortic valve area, by VTI measures 0.96 cm. Pulmonic Valve: The pulmonic valve was normal in structure. Pulmonic valve regurgitation is not visualized. Aorta: The aortic root is normal in size and structure and aortic dilatation noted. There is mild dilatation of the ascending aorta, measuring 42 mm. Venous: The inferior vena cava is normal in size with greater than 50% respiratory variability, suggesting right atrial pressure of 3 mmHg. IAS/Shunts: No atrial level shunt detected by color flow Doppler.  LEFT VENTRICLE PLAX 2D LVIDd:         5.50 cm  Diastology LVIDs:         4.10 cm  LV e' medial:    5.87 cm/s LV PW:         1.00 cm  LV E/e' medial:  15.0 LV IVS:        1.10 cm  LV e' lateral:   8.49 cm/s LVOT diam:     2.00 cm  LV E/e' lateral: 10.3 LV SV:         98 LV SV Index:   51  LVOT Area:     3.14 cm  RIGHT VENTRICLE RV S prime:     12.20 cm/s TAPSE (M-mode): 2.4 cm LEFT ATRIUM             Index  RIGHT ATRIUM           Index LA diam:        4.30 cm 2.24 cm/m  RA Area:     11.60 cm LA Vol (A2C):   88.5 ml 46.17 ml/m RA Volume:   18.60 ml  9.70 ml/m LA Vol (A4C):   94.3 ml 49.20 ml/m LA Biplane Vol: 97.0 ml 50.60 ml/m  AORTIC VALVE AV Area (Vmax):    0.92 cm AV Area (Vmean):   0.95 cm AV Area (VTI):     0.96 cm AV Vmax:           419.40 cm/s AV Vmean:          319.400 cm/s AV VTI:            1.029 m AV Peak Grad:      70.4 mmHg AV Mean Grad:      50.0 mmHg LVOT Vmax:         123.00 cm/s LVOT Vmean:        96.600 cm/s LVOT VTI:          0.313 m LVOT/AV VTI ratio: 0.30 AI PHT:            658 msec  AORTA Ao Root diam: 3.50 cm Ao Asc diam:  4.05 cm MITRAL VALVE               TRICUSPID VALVE MV Area (PHT): 3.53 cm    TR Peak grad:   41.5 mmHg MV Decel Time: 215 msec    TR Vmax:        322.00 cm/s MV E velocity: 87.80 cm/s MV A velocity: 70.70 cm/s  SHUNTS MV E/A ratio:  1.24        Systemic VTI:  0.31 m                            Systemic Diam: 2.00 cm Loralie Champagne MD Electronically signed by Loralie Champagne MD Signature Date/Time: 03/24/2020/3:29:14 PM    Final    US LIVER DOPPLER  Result Date: 03/17/2020 CLINICAL DATA:  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 History of abdominal aortic aneurysm. EXAM: ABDOMEN ULTRASOUND COMPLETE COMPARISON:  Hepatic Doppler ultrasound-03/28/2017; aortic ultrasound-11/10/2018 Chest CT-05/29/2018; CT abdomen pelvis-02/26/2017 FINDINGS: Gallbladder: Surgically absent. Common bile duct: Diameter: Borderline enlarged measures 0.8 cm in diameter, presumably the sequela of post cholecystectomy state and biliary reservoir phenomena. Liver: There is mild diffuse slightly coarsened echogenicity of the hepatic parenchyma. No discrete intrahepatic biliary ductal dilatation. _________________________________________________________ Hepatic Doppler: Main portal  vein size: 0.5 cm in diameter Portal vein velocities (normal hepatopetal directional flow) Main Dist: 25 cm/sec Right: 40 cm/sec Left: 27 cm/sec Hepatic Vein Velocities (Normal hepatofugal directional flow) Right:  27 cm/sec Middle:  44 cm/sec Left:  38 cm/sec IVC: Present and patent with normal respiratory phasicity. Hepatic Artery Velocity: 81 cm/sec Splenic Vein Velocity: 41 cm/sec Spleen: 7.9 cm x 6.0 cm x 13.3 cm with a total volume of 327 cm^3 (411 cm^3 is upper limit normal) Portal Vein Occlusion/Thrombus: No Splenic Vein Occlusion/Thrombus: No Ascites: None Varices: None _________________________________________________________ IVC: No abnormality visualized. Pancreas: Limited visualization of the pancreatic head and neck is normal. Visualization of the pancreatic body and tail is obscured by bowel gas. Spleen: Size and appearance within normal limits. Right Kidney: Normal cortical thickness, echogenicity and size, measuring 13.3 cm in length. No focal renal lesions. No echogenic renal stones. No urinary obstruction. Left Kidney: Normal cortical  thickness, echogenicity and size, measuring 15.4 cm in length. Note is made of a prominent renal column of Bertin. No focal renal lesions. No echogenic renal stones. No urinary obstruction. Abdominal aorta: Redemonstrated fusiform aneurysmal dilatation the abdominal aorta measuring approximately 4.8 x 4.5 cm, increased in size compared to abdominal CT performed 02/2017, previously, 4.3 x 4.0 cm _________________________________________________________ IMPRESSION: 1. 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 2. No evidence of portal venous hypertension. Specifically, no evidence of splenomegaly or intra-abdominal ascites. 3. Redemonstrated moderately heterogeneous  and coarsened echogenicity of the hepatic parenchyma compatible with provided history of cirrhosis. No discrete hepatic lesions, however further evaluation with abdominal MRI could as indicated. 4. Suspected increase in size infrarenal abdominal aortic aneurysm, currently measuring 4.8 cm, previously, 4.3 cm when compared to abdominal CT performed 02/2017. If not previously performed, recommend vascular surgery referral/consultation and/or follow-up CT of the abdomen pelvis in 6 months. This recommendation follows ACR consensus guidelines: White Paper of the ACR Incidental Findings Committee II on Vascular Findings. J Am Coll Radiol 2013; 10:789-794. Electronically Signed   By: Sandi Mariscal M.D.   On: 03/17/2020 12:57   VAS US CAROTID  Result Date: 03/25/2020 Carotid Arterial Duplex Study Indications:       History of carotid stenosis. Patient denies cerebrovascular                    symptoms. History of left tonsillar cancer with radiation and                    resection. Risk Factors:      Hypertension, hyperlipidemia, past history of smoking, prior                    CVA. Comparison Study:  Previous carotid duplex performed 11/20/18 at [facility] showed RICA velocities of 470/96 cm/sec and LICA                    velocities of 117/31 cm/sec. Performing Technologist: Mariane Masters RVT  Examination Guidelines: A complete evaluation includes B-mode imaging, spectral Doppler, color Doppler, and power Doppler as needed of all accessible portions of each vessel. Bilateral testing is considered an integral part of a complete examination. Limited examinations for reoccurring indications may be performed as noted.  Right Carotid Findings: +----------+--------+--------+--------+----------------------+--------+           PSV cm/sEDV cm/sStenosisPlaque Description    Comments +----------+--------+--------+--------+----------------------+--------+ CCA Prox  54      10                                              +----------+--------+--------+--------+----------------------+--------+ CCA Distal57      7                                              +----------+--------+--------+--------+----------------------+--------+ ICA Prox  142     35      1-39%   focal and heterogenous         +----------+--------+--------+--------+----------------------+--------+  ICA Mid   70      18                                             +----------+--------+--------+--------+----------------------+--------+ ICA Distal58      14                                             +----------+--------+--------+--------+----------------------+--------+ ECA       139     8                                              +----------+--------+--------+--------+----------------------+--------+ +----------+--------+-------+----------------+-------------------+           PSV cm/sEDV cmsDescribe        Arm Pressure (mmHG) +----------+--------+-------+----------------+-------------------+ [id number]            Multiphasic, [id number]                 +----------+--------+-------+----------------+-------------------+ +---------+--------+-+--------+-+--------------+ VertebralPSV cm/s0EDV cm/s0Not identified +---------+--------+-+--------+-+--------------+  Left Carotid Findings: +----------+--------+--------+--------+------------------+--------+           PSV cm/sEDV cm/sStenosisPlaque DescriptionComments +----------+--------+--------+--------+------------------+--------+ CCA Prox  59      12                                         +----------+--------+--------+--------+------------------+--------+ CCA Mid   60      14      <50%    heterogenous               +----------+--------+--------+--------+------------------+--------+ CCA Distal63      14                                         +----------+--------+--------+--------+------------------+--------+ ICA  Prox  102     20      1-39%   calcific                   +----------+--------+--------+--------+------------------+--------+ ICA Mid   79      19                                         +----------+--------+--------+--------+------------------+--------+ ICA Distal88      22                                         +----------+--------+--------+--------+------------------+--------+ ECA       84      8                                          +----------+--------+--------+--------+------------------+--------+ +----------+--------+--------+----------------+-------------------+           PSV cm/sEDV cm/sDescribe  Arm Pressure (mmHG) +----------+--------+--------+----------------+-------------------+ Subclavian116             Multiphasic, [id number]                 +----------+--------+--------+----------------+-------------------+ +---------+--------+-+--------+-+--------------+ VertebralPSV cm/s0EDV cm/s0Not identified +---------+--------+-+--------+-+--------------+   Summary: Right Carotid: Velocities in the right ICA are consistent with a 1-39% stenosis. Left Carotid: Velocities in the left ICA are consistent with a 1-39% stenosis.               Non-hemodynamically significant plaque <50% noted in the CCA. Vertebrals:  Bilateral vertebral arteries demonstrate no discernable flow. Subclavians: Right subclavian artery was stenotic. Normal flow hemodynamics were              seen in the left subclavian artery. *See table(s) above for measurements and observations.  Electronically signed by Ida Rogue MD on 03/25/2020 at 9:03:17 PM.    Final     Impression/Plan:    1) Head and Neck Cancer Status: NED  2) Nutritional Status: No acute issues PEG tube: Removed 09/2018  3) Swallowing: Functional - continue SLP exervices  4) Thyroid function: Check annually - WNL Lab Results  Component Value Date   TSH 1.569 03/25/2020    5) Other: f/up as scheduled with ENT.   We discussed potential signs of recurrence to look out for and report if they arise.   6)  For future visits, the plan is for the patient to return to the cancer center to f/u with H+N survivorship in 6 mo and in 12 mo with me. He will continue to follow with ENT. I wished him the best with his heart valve replacement.  COVID vaccine status - he is boosted.  On date of service, in total, I spent 25 minutes on this encounter.  He was seen in person. ____________________________   Eppie Gibson, MD

## 2020-03-31 NOTE — Interval H&P Note (Signed)
History and Physical Interval Note:  03/31/2020 12:20 PM  Brandon [name]  has presented today for surgery, with the diagnosis of Pre TAVR.  The various methods of treatment have been discussed with the patient and family. After consideration of risks, benefits and other options for treatment, the patient has consented to  Procedure(s): RIGHT/LEFT HEART CATH AND CORONARY ANGIOGRAPHY (N/A) as a surgical intervention.  The patient's history has been reviewed, patient examined, no change in status, stable for surgery.  I have reviewed the patient's chart and labs.  Questions were answered to the patient's satisfaction.     Clarke Amburn [employer]

## 2020-03-31 NOTE — Progress Notes (Signed)
Pt ambulated without difficulty or bleeding.   Discharged home with wife who will drive and stay with pt x 24 hrs 

## 2020-03-31 NOTE — Research (Signed)
[place] Informed Consent   Subject Name: Brandon [name]  Subject met inclusion and exclusion criteria.  The informed consent form, study requirements and expectations were reviewed with the subject and questions and concerns were addressed prior to the signing of the consent form.  The subject verbalized understanding of the trail requirements.  The subject agreed to participate in the [facility] trial and signed the informed consent.  The informed consent was obtained prior to performance of any protocol-specific procedures for the subject.  A copy of the signed informed consent was given to the subject and a copy was placed in the subject's medical record.  Philemon Kingdom D 03/31/2020, 0913 am

## 2020-03-31 NOTE — Progress Notes (Signed)
Discharge instructions reviewed with patient and family. Verbalized understanding. 

## 2020-04-01 ENCOUNTER — Telehealth (HOSPITAL_COMMUNITY): Payer: Self-pay

## 2020-04-01 ENCOUNTER — Other Ambulatory Visit: Payer: Self-pay

## 2020-04-01 ENCOUNTER — Encounter (HOSPITAL_COMMUNITY): Payer: Self-pay | Admitting: Cardiology

## 2020-04-01 DIAGNOSIS — I350 Nonrheumatic aortic (valve) stenosis: Secondary | ICD-10-CM

## 2020-04-01 MED ORDER — ATORVASTATIN CALCIUM 80 MG PO TABS: 80.0000 mg | ORAL_TABLET | Freq: Every day | ORAL | 3 refills | Status: DC

## 2020-04-01 NOTE — Telephone Encounter (Signed)
-----   Message from Larey Dresser, MD sent at 03/25/2020  2:51 PM EST ----- LDL too high, goal < 70.  Increase atorvastatin to 80 mg daily with lipids/LFTs in 2 months.

## 2020-04-01 NOTE — Telephone Encounter (Signed)
Patient advised and verbalized understanding. New Rx sent into patients pharmacy.  

## 2020-04-05 ENCOUNTER — Ambulatory Visit (HOSPITAL_COMMUNITY)
Admission: RE | Admit: 2020-04-05 | Discharge: 2020-04-05 | Disposition: A | Discharge status: Home / Self Care | Payer: Medicare Other | Source: Ambulatory Visit | Attending: Cardiovascular Disease | Admitting: Cardiovascular Disease

## 2020-04-05 ENCOUNTER — Other Ambulatory Visit: Payer: Self-pay

## 2020-04-05 ENCOUNTER — Ambulatory Visit (HOSPITAL_COMMUNITY): Payer: Medicare Other

## 2020-04-05 ENCOUNTER — Encounter (HOSPITAL_COMMUNITY): Payer: Self-pay

## 2020-04-05 DIAGNOSIS — K5730 Diverticulosis of large intestine without perforation or abscess without bleeding: Secondary | ICD-10-CM | POA: Diagnosis not present

## 2020-04-05 DIAGNOSIS — I700 Atherosclerosis of aorta: Secondary | ICD-10-CM | POA: Diagnosis not present

## 2020-04-05 DIAGNOSIS — I350 Nonrheumatic aortic (valve) stenosis: Secondary | ICD-10-CM | POA: Diagnosis not present

## 2020-04-05 DIAGNOSIS — I714 Abdominal aortic aneurysm, without rupture: Secondary | ICD-10-CM | POA: Diagnosis not present

## 2020-04-05 MED ORDER — IOHEXOL 350 MG/ML SOLN
100.0000 mL | Freq: Once | INTRAVENOUS | Status: AC | PRN
  Administered 2020-04-05: 100 mL via INTRAVENOUS

## 2020-04-11 ENCOUNTER — Telehealth: Payer: Self-pay

## 2020-04-11 MED ORDER — PREDNISONE 1 MG PO TABS
ORAL_TABLET | ORAL | 0 refills | Status: DC
Start: 2020-04-11 — End: 2020-05-23

## 2020-04-11 MED ORDER — PREDNISONE 5 MG PO TABS: 5.0000 mg | ORAL_TABLET | Freq: Every day | ORAL | 0 refills | Status: DC

## 2020-04-11 NOTE — Telephone Encounter (Signed)
Last Visit: 02/10/2020 Next Visit: 05/11/2020  Current Dose per office note on 02/10/2020, prednisone 10 mg p.o. daily for the last 2 weeks and continues to do well, We discussed tapering prednisone by 1 mg every month Dx: Polymyalgia rheumatica   Last Fill:02/10/2020  Okay to refill Prednisone?

## 2020-04-11 NOTE — Telephone Encounter (Signed)
Patient called requesting prescription refill of Prednisone 1 mg and Prednisone 5 mg to be sent to [facility] at [facility].

## 2020-04-12 ENCOUNTER — Telehealth: Payer: Self-pay | Admitting: *Deleted

## 2020-04-12 ENCOUNTER — Institutional Professional Consult (permissible substitution) (INDEPENDENT_AMBULATORY_CARE_PROVIDER_SITE_OTHER): Payer: Medicare Other | Admitting: Surgery

## 2020-04-12 ENCOUNTER — Encounter: Payer: Self-pay | Admitting: Surgery

## 2020-04-12 ENCOUNTER — Other Ambulatory Visit: Payer: Self-pay

## 2020-04-12 VITALS — BP 111/68 | HR 74 | Resp 20 | Ht 67.0 in

## 2020-04-12 DIAGNOSIS — I2510 Atherosclerotic heart disease of native coronary artery without angina pectoris: Secondary | ICD-10-CM

## 2020-04-12 DIAGNOSIS — I350 Nonrheumatic aortic (valve) stenosis: Secondary | ICD-10-CM

## 2020-04-12 NOTE — Progress Notes (Signed)
Patient ID: JAQUAVIUS HUDLER, male   DOB: [birth year], 80 y.o.   MRN: [id number]  [facility] CONSULTATION REPORT  Referring Provider is Larey Dresser, MD Primary Cardiologist is No primary care provider on file. PCP is [name], [name], MD  Chief Complaint  Patient presents with  . Coronary Artery Disease  . Aortic Stenosis  . 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 Aundra Dubin.  Recent echocardiogram showed severe aortic stenosis with a mean gradient of 50 mmHg and a valve area of 0.96 cm.  Left ventricular ejection fraction was 60 to 65%.  Cardiac catheterization on 03/31/2020 showed a known occluded right coronary artery with collaterals from the LAD, 95% calcified proximal LAD stenosis, and a long 60 to 70% proximal left circumflex stenosis.  The patient says that he feels fairly well and does what he wants to do but is not very active.  He does note shortness of breath and some chest pressure with heavy exertion like moving his trash cans of the street or doing heavy lifting.  He can walk around his house without symptoms but does not get outside walking much.  He denies any dizziness or syncope.  He has no orthopnea.  He denies peripheral edema.  He is here today with his wife.  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  He has a long history of smoking but quit for a while and just recently started again but is now trying to quit.  He was smoking 1  pack/day prior to trying to quit.  Past Medical History:  Diagnosis Date  . Aortic stenosis    mild echocardiogram 8/09 EF 60%, showed no regional wall motion abnml, mild LVH, mod focal basal septal hypertrophy and mild dyastolic dysfunction. 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 mean gradient across aortic valve was 8 mmHG. also mild L atrial enlargement and normal RV size and function. minimal AS on LHC in 7/10.   . Arthritis    "all over" (07/19/2016)  . BPH (benign prostatic hypertrophy)   . CAD (coronary artery disease)    LCH (7/10) totally occluded proximal RCA with very robuse L to R collaterals, 50% proximal LAD stenosis, EF 65%, medical management.    . CHF (congestive heart failure) ([place])   . Chicken pox   . Chronic lower back pain    s/p surgical fusion  . Diverticulosis   . Esophageal stricture   . GERD (gastroesophageal reflux disease)   . Heart murmur   . Hepatitis B 1984  . Hiatal hernia   . History of radiation therapy 01/16/18- 03/05/18   Left Tonsil, 66 Gy in 33 fractions to high risk nodal echelons.   Marland Kitchen HLD (hyperlipidemia)   . HTN (hypertension)   . 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 Osteoarthritis   . TIA (transient ischemic attack) 1990s   hx  . tonsillar ca dx'd 11/2017  . Tubular adenoma of colon 2009  Past Surgical History:  Procedure Laterality Date  . BACK SURGERY    . CATARACT EXTRACTION W/ INTRAOCULAR LENS  IMPLANT, BILATERAL Bilateral 01/2012 - 02/2012  . DIRECT LARYNGOSCOPY Left 10/09/2017   Procedure: DIRECT LARYNGOSCOPY WITH BOPSY;  Surgeon: Jodi Marble, MD;  Location: [place];  Service: ENT;  Laterality: Left;  . ESOPHAGOGASTRODUODENOSCOPY (EGD) WITH ESOPHAGEAL DILATION     "couple times" (07/19/2016)  . ESOPHAGOSCOPY Left 10/09/2017   Procedure: ESOPHAGOSCOPY;  Surgeon: Jodi Marble, MD;  Location: [place];  Service: ENT;  Laterality: Left;  . IR GASTROSTOMY TUBE  MOD SED  01/08/2018  . IR THORACENTESIS ASP PLEURAL SPACE W/IMG GUIDE  07/19/2016  . LAPAROSCOPIC CHOLECYSTECTOMY  1994  . LUMBAR DISC SURGERY  05/1996   L4-5; Dr. Sherwood Gambler   . LUMBAR LAMINECTOMY/DECOMPRESSION MICRODISCECTOMY  10/2002   L3-4. Dr. Sherwood Gambler  . MULTIPLE TOOTH EXTRACTIONS  1980s  . PARTIAL GLOSSECTOMY  12/02/2017   Dr. Nicolette Bang- [facility]  . pharyngoplasty for closure of tingue base defect  12/02/2017   Dr. Nicolette Bang- [facility]  . PICC LINE INSERTION  07/15/2016  . POSTERIOR LUMBAR FUSION  09/1996   Ray cage, L4-5 Dr. Rita Ohara  . PROSTATE BIOPSY  ~ 2017  . radical tonsillectomy Left 12/02/2017   Dr. [facility]ico[facility] at [facility]  . RIGHT HEART CATH AND CORONARY ANGIOGRAPHY N/A 03/31/2020   Procedure: RIGHT HEART CATH AND CORONARY ANGIOGRAPHY;  Surgeon: Larey Dresser, MD;  Location: [place] [facility];  Service: Cardiovascular;  Laterality: N/A;  . RIGID BRONCHOSCOPY Left 10/09/2017   Procedure: RIGID BRONCHOSCOPY;  Surgeon: Jodi Marble, MD;  Location: [place];  Service: ENT;  Laterality: Left;  . TRACHEOSTOMY  12/02/2017   Dr. Nicolette Bang- [facility]    Family History  Problem Relation Age of Onset  . Heart disease Father 38       Living  . Coronary artery disease Father        CABG  . Alzheimer's disease Mother 29       Deceased  . Arthritis Mother   . Aneurysm Brother   . Stomach cancer Maternal Uncle   . Brain cancer Maternal Aunt        x2  . Obesity Daughter        Had Bypass Sx    Social History   Socioeconomic History  . Marital status: Married    Spouse name: etta  . Number of children: 2  . Years of education: Not on file  . Highest education level: Not on file  Occupational History  . Occupation: retired    Fish farm manager: RETIRED    Comment: disabled due to back problems  Tobacco Use  . Smoking status: Former Smoker    Packs/day: 1.50    Years: 50.00    Pack years: 75.00    Types: Cigarettes    Quit date: 09/05/2017    Years since quitting: 2.6  .  Smokeless tobacco: Never Used  . Tobacco comment: smoked less than 1 ppd for 40+ years; Using Vapor Cig  Vaping Use  . Vaping Use: Never used  Substance and Sexual Activity  . Alcohol use: Yes    Alcohol/week: 0.0 standard drinks    Comment: 07/19/2016 "might have a few drinks/year"  . Drug use: No  . Sexual activity: Never  Other Topics Concern  . Not on file  Social History Narrative   ** Merged History Encounter **       Married (3rd), [place]. 2 children from  1st marriage, 4 step children.    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       Cell # [phone]   Social Determinants of Health   Financial Resource Strain: Low Risk   . Difficulty of Paying Living Expenses: Not hard at all  Food Insecurity: No Food Insecurity  . Worried About Charity fundraiser in the Last Year: Never true  . Ran Out of Food in the Last Year: Never true  Transportation Needs: No Transportation Needs  . Lack of Transportation (Medical): No  . Lack of Transportation (Non-Medical): No  Physical Activity: Inactive  . Days of Exercise per Week: 0 days  . Minutes of Exercise per Session: 0 min  Stress: No Stress Concern Present  . Feeling of Stress : Not at all  Social Connections: Moderately Isolated  . Frequency of Communication with Friends and Family: More than three times a week  . Frequency of Social Gatherings with Friends and Family: Once a week  . Attends Religious Services: Never  . Active Member of Clubs or Organizations: No  . Attends Archivist Meetings: Never  . Marital Status: Married  Human resources officer Violence: Not At Risk  . Fear of Current or Ex-Partner: No  . Emotionally Abused: No  . Physically Abused: No  . Sexually Abused: No    Current Outpatient Medications  Medication Sig Dispense Refill  . amLODipine (NORVASC) 10 MG tablet Take 1 tablet (10 mg total) by mouth daily. 90 tablet  3  . aspirin 81 MG EC tablet Take 81 mg by mouth at bedtime.    Marland Kitchen atorvastatin (LIPITOR) 80 MG tablet Take 1 tablet (80 mg total) by mouth daily. 90 tablet 3  . carvedilol (COREG) 6.25 MG tablet Take 1 tablet (6.25 mg total) by mouth 2 (two) times daily. 60 tablet 11  . citalopram (CELEXA) 20 MG tablet Take 1 tablet (20 mg total) by mouth daily. 30 tablet 5  . fluticasone (FLONASE) 50 MCG/ACT nasal spray Place 2 sprays into both nostrils daily as needed for rhinitis or allergies.    Marland Kitchen gabapentin (NEURONTIN) 300 MG capsule TAKE 1 CAPSULE(300 MG) BY MOUTH THREE TIMES DAILY (Patient taking differently: Take 300 mg by mouth 2 (two) times daily.) 90 capsule 5  . isosorbide mononitrate (IMDUR) 30 MG 24 hr tablet TAKE 1 TABLET(30 MG) BY MOUTH AT BEDTIME (Patient taking differently: Take 30 mg by mouth daily.) 90 tablet 1  . lisinopril (ZESTRIL) 10 MG tablet TAKE 1 TABLET(10 MG) BY MOUTH DAILY (Patient taking differently: Take 10 mg by mouth daily.) 90 tablet 3  . Nutritional Supplements (PROTEIN SUPPLEMENT 80% PO) Take 11 oz by mouth daily. Premier Protein chocolate    . omeprazole (PRILOSEC) 20 MG capsule TAKE 1 CAPSULE(20 MG) BY MOUTH DAILY (Patient taking differently: Take 20 mg by mouth daily.) 90 capsule 2  . oxyCODONE (OXY IR/ROXICODONE) 5 MG immediate release tablet Take 1-2 tablets (5-10 mg total) by mouth every 6 (six) hours as needed for severe pain (to take 2 tabs ONLY when severe pain- which is not usual for pt). 120 tablet 0  . predniSONE (DELTASONE) 1 MG tablet Take 1 tablet by mouth daily with the 5 mg tablet (total 6 mg daily). 30 tablet 0  . predniSONE (DELTASONE) 5 MG tablet Take 1 tablet (5 mg total) by mouth daily with breakfast. 30 tablet 0  . tamsulosin (  FLOMAX) 0.4 MG CAPS capsule Take 0.4 mg by mouth daily.     . traMADol (ULTRAM) 50 MG tablet Take 2 tablets (100 mg total) by mouth 2 (two) times daily. (Patient taking differently: Take 100 mg by mouth daily as needed for moderate pain  or severe pain.) 120 tablet 5   No current facility-administered medications for this visit.    Allergies  Allergen Reactions  . Celebrex [Celecoxib] Hives and Itching      Review of Systems:   General:  normal appetite, + decreased energy, no weight gain, no weight loss, no fever  Cardiac:  + chest pain with exertion, no chest pain at rest, +SOB with  exertion, no resting SOB, no PND, no orthopnea, no palpitations, no arrhythmia, no atrial fibrillation, no LE edema, no dizzy spells, no syncope  Respiratory:  + exertional shortness of breath, no home oxygen, no productive cough, no dry cough, no bronchitis, no wheezing, no hemoptysis, no asthma, no pain with inspiration or cough, no sleep apnea, no CPAP at night  GI:   no difficulty swallowing, no reflux, no frequent heartburn, no hiatal hernia, no abdominal pain, no constipation, no diarrhea, no hematochezia, no hematemesis, no melena  GU:   no dysuria,  no frequency, no urinary tract infection, no hematuria, no enlarged prostate, no kidney stones, no kidney disease  Vascular:  no pain suggestive of claudication, no pain in feet, no leg cramps, no varicose veins, no DVT, no non-healing foot ulcer  Neuro:   no stroke, + TIA's, no seizures, no headaches, no temporary blindness one eye,  no slurred speech, no peripheral neuropathy, + chronic pain, no instability of gait, no memory/cognitive dysfunction  Musculoskeletal: + arthritis, no joint swelling, + myalgias, no difficulty walking, normal mobility   Skin:   no rash, no itching, no skin infections, no pressure sores or ulcerations  Psych:   + anxiety, no depression, no nervousness, no unusual recent stress  Eyes:   no blurry vision, + floaters, no recent vision changes, does not wear glasses or contacts  ENT:   + hearing loss, no loose or painful teeth, + dentures  Hematologic:  + easy bruising, no abnormal bleeding, no clotting disorder, no frequent epistaxis  Endocrine:  no diabetes,  does not check CBG's at home      Physical Exam:   BP 111/68 (BP Location: Right Arm, Patient Position: Sitting)   Pulse 74   Resp 20   Ht 5\' 7"  (1.702 m)   SpO2 94% Comment: RA  BMI 27.11 kg/m   General:  Elderly, well-appearing  HEENT:  Unremarkable, NCAT, PERLA, EOMI  Neck:   no JVD, no bruits, no adenopathy   Chest:   clear to auscultation, symmetrical breath sounds, no wheezes, no rhonchi   CV:   RRR, grade lll/VI crescendo/decrescendo murmur heard best at RSB,  no diastolic murmur  Abdomen:  soft, non-tender, palpable AAA  Extremities:  warm, well-perfused, pulses palpable at ankle, no LE edema  Rectal/GU  Deferred  Neuro:   Grossly non-focal and symmetrical throughout  Skin:   Clean and dry, no rashes, no breakdown   Diagnostic Tests:  ECHOCARDIOGRAM REPORT       Patient Name:  ANSELM AUMILLER Date of Exam: 03/24/2020  Medical Rec #: [id number]  Height:    68.0 in  Accession #:  [id number]  Weight:    172.0 lb  Date of Birth: [birth year]  BSA:     1.917 m  Patient Age:  79 years   BP:      141/61 mmHg  Patient Gender: M      HR:      65 bpm.  Exam Location: Outpatient   Procedure: 2D Echo, Cardiac Doppler and Color Doppler   Indications:  Congestive Heart Failure 428.0 / I50.9    History:    Patient has prior history of Echocardiogram examinations,  most         recent 07/18/2017. CAD, TIA, AS; Risk Factors:Hypertension  and         Dyslipidemia.    Sonographer:  Bernadene Person RDCS  Referring Phys: [place]    1. Left ventricular ejection fraction, by estimation, is 60 to 65%. The  left ventricle has normal function. The left ventricle has no regional  wall motion abnormalities. There is mild left ventricular hypertrophy.  Left ventricular diastolic parameters  are consistent with Grade II diastolic dysfunction (pseudonormalization).  2. Right ventricular  systolic function is normal. The right ventricular  size is normal. There is mildly elevated pulmonary artery systolic  pressure. The estimated right ventricular systolic pressure is 32.1 mmHg.  3. Left atrial size was moderately dilated.  4. The mitral valve is degenerative. No evidence of mitral valve  regurgitation. No evidence of mitral stenosis.  5. The aortic valve is tricuspid. Aortic valve regurgitation is mild.  Severe aortic valve stenosis. Aortic valve area, by VTI measures 0.96 cm.  Aortic valve mean gradient measures 50.0 mmHg.  6. Aortic dilatation noted. There is mild dilatation of the ascending  aorta, measuring 42 mm.  7. The inferior vena cava is normal in size with greater than 50%  respiratory variability, suggesting right atrial pressure of 3 mmHg.   FINDINGS  Left Ventricle: Left ventricular ejection fraction, by estimation, is 60  to 65%. The left ventricle has normal function. The left ventricle has no  regional wall motion abnormalities. The left ventricular internal cavity  size was normal in size. There is  mild left ventricular hypertrophy. Left ventricular diastolic parameters  are consistent with Grade II diastolic dysfunction (pseudonormalization).   Right Ventricle: The right ventricular size is normal. No increase in  right ventricular wall thickness. Right ventricular systolic function is  normal. There is mildly elevated pulmonary artery systolic pressure. The  tricuspid regurgitant velocity is 3.22  m/s, and with an assumed right atrial pressure of 3 mmHg, the estimated  right ventricular systolic pressure is 22.4 mmHg.   Left Atrium: Left atrial size was moderately dilated.   Right Atrium: Right atrial size was normal in size.   Pericardium: There is no evidence of pericardial effusion.   Mitral Valve: The mitral valve is degenerative in appearance. Mild to  moderate mitral annular calcification. No evidence of mitral valve   regurgitation. No evidence of mitral valve stenosis.   Tricuspid Valve: The tricuspid valve is normal in structure. Tricuspid  valve regurgitation is trivial.   Aortic Valve: The aortic valve is tricuspid. Aortic valve regurgitation is  mild. Aortic regurgitation PHT measures 658 msec. Severe aortic stenosis  is present. Aortic valve mean gradient measures 50.0 mmHg. Aortic valve  peak gradient measures 70.4 mmHg.  Aortic valve area, by VTI measures 0.96 cm.   Pulmonic Valve: The pulmonic valve was normal in structure. Pulmonic valve  regurgitation is not visualized.   Aorta: The aortic root is normal in size and structure and aortic  dilatation noted. There is mild dilatation of the ascending aorta,  measuring 42 mm.   Venous: The inferior vena cava is normal in size with greater than 50%  respiratory variability, suggesting right atrial pressure of 3 mmHg.   IAS/Shunts: No atrial level shunt detected by color flow Doppler.     LEFT VENTRICLE  PLAX 2D  LVIDd:     5.50 cm Diastology  LVIDs:     4.10 cm LV e' medial:  5.87 cm/s  LV PW:     1.00 cm LV E/e' medial: 15.0  LV IVS:    1.10 cm LV e' lateral:  8.49 cm/s  LVOT diam:   2.00 cm LV E/e' lateral: 10.3  LV SV:     98  LV SV Index:  51  LVOT Area:   3.14 cm     RIGHT VENTRICLE  RV S prime:   12.20 cm/s  TAPSE (M-mode): 2.4 cm   LEFT ATRIUM       Index    RIGHT ATRIUM      Index  LA diam:    4.30 cm 2.24 cm/m RA Area:   11.60 cm  LA Vol (A2C):  88.5 ml 46.17 ml/m RA Volume:  18.60 ml 9.70 ml/m  LA Vol (A4C):  94.3 ml 49.20 ml/m  LA Biplane Vol: 97.0 ml 50.60 ml/m  AORTIC VALVE  AV Area (Vmax):  0.92 cm  AV Area (Vmean):  0.95 cm  AV Area (VTI):   0.96 cm  AV Vmax:      419.40 cm/s  AV Vmean:     319.400 cm/s  AV VTI:      1.029 m  AV Peak Grad:   70.4 mmHg  AV Mean Grad:   50.0 mmHg  LVOT Vmax:     123.00  cm/s  LVOT Vmean:    96.600 cm/s  LVOT VTI:     0.313 m  LVOT/AV VTI ratio: 0.30  AI PHT:      658 msec    AORTA  Ao Root diam: 3.50 cm  Ao Asc diam: 4.05 cm   MITRAL VALVE        TRICUSPID VALVE  MV Area (PHT): 3.53 cm  TR Peak grad:  41.5 mmHg  MV Decel Time: 215 msec  TR Vmax:    322.00 cm/s  MV E velocity: 87.80 cm/s  MV A velocity: 70.70 cm/s SHUNTS  MV E/A ratio: 1.24    Systemic VTI: 0.31 m               Systemic Diam: 2.00 cm   Loralie Champagne MD  Electronically signed by Loralie Champagne MD  Signature Date/Time: 03/24/2020/3:29:14 PM      Final    Physicians  Panel Physicians Referring Physician Case Authorizing Physician  Larey Dresser, MD (Primary)      Procedures  RIGHT HEART CATH AND CORONARY ANGIOGRAPHY   Conclusion  1. Low filling pressures.  2. Preserved cardiac output.  3. Known occluded RCA with robust collaterals from LAD.  4. 95% calcified proximal LAD stenosis (LAD provides RCA collaterals).  5. 60-70% proximal LCx stenosis.   Patient has known severe AS (from echo).  I reviewed films with Drs Angelena Form and Burt Knack.  PCI-TAVR would be an option, would likely need atherectomy to address LAD and then PCI LCx.  SAVR-CABG would also be an option, and may be safer in the long term as any compromise to an LAD stent would affect both LAD and RCA territory. Patient will be seen by cardiac surgeon and get scans  for TAVR planning.     Procedural Details  Technical Details Procedure: Right Heart Cath, Selective Coronary Angiography  Indication: Aortic stenosis.    Procedural Details: The right brachial and radial areas were prepped, draped, and anesthetized with 1% lidocaine. There was a pre-existing peripheral IV in the right brachial artery that was replaced with a 5F venous sheath. A Swan-Ganz catheter was used for the right heart catheterization. Standard protocol was followed for recording of  right heart pressures and sampling of oxygen saturations. Fick cardiac output was calculated. The right radial artery was entered using modified Seldinger technique and a 50F sheath was placed. The patient received 3 mg IA verapamil and weight-based IV heparin. Standard Judkins catheters were used for selective coronary angiography.  The aortic valve was not crossed, patient known to have severe AS from echo. There were no immediate procedural complications. The patient was transferred to the post catheterization recovery area for further monitoring.   Estimated blood loss <50 mL.   During this procedure medications were administered to achieve and maintain moderate conscious sedation while the patient's heart rate, blood pressure, and oxygen saturation were continuously monitored and I was present face-to-face 100% of this time.   Medications (Filter: Administrations occurring from 1142 to 1300 on 03/31/20) (important) Continuous medications are totaled by the amount administered until 03/31/20 1300.    Heparin (Porcine) in NaCl 1000-0.9 UT/500ML-% SOLN (mL) Total volume:  500 mL  Date/Time Rate/Dose/Volume Action   03/31/20 1153 500 mL Given    Heparin (Porcine) in NaCl 1000-0.9 UT/500ML-% SOLN (mL) Total volume:  500 mL  Date/Time Rate/Dose/Volume Action   03/31/20 1153 500 mL Given    fentaNYL (SUBLIMAZE) injection (mcg) Total dose:  50 mcg  Date/Time Rate/Dose/Volume Action   03/31/20 1216 25 mcg Given   1229 25 mcg Given    midazolam (VERSED) injection (mg) Total dose:  2 mg  Date/Time Rate/Dose/Volume Action   03/31/20 1216 1 mg Given   1229 1 mg Given    lidocaine (PF) (XYLOCAINE) 1 % injection (mL) Total volume:  3 mL  Date/Time Rate/Dose/Volume Action   03/31/20 1225 1 mL Given   1230 2 mL Given    Radial Cocktail/Verapamil only (mL) Total volume:  10 mL  Date/Time Rate/Dose/Volume Action   03/31/20 1230 10 mL Given    heparin sodium (porcine) injection  (Units) Total dose:  4,000 Units  Date/Time Rate/Dose/Volume Action   03/31/20 1233 4,000 Units Given    iohexol (OMNIPAQUE) 350 MG/ML injection (mL) Total volume:  45 mL  Date/Time Rate/Dose/Volume Action   03/31/20 1251 45 mL Given    Sedation Time  Sedation Time Physician-1: 34 minutes 42 seconds   Contrast  Medication Name Total Dose  iohexol (OMNIPAQUE) 350 MG/ML injection 45 mL    Radiation/Fluoro  Fluoro time: 4.7 (min) DAP: 32871 (mGycm2) Cumulative Air Kerma: 657 (mGy)   Coronary Findings   Diagnostic Dominance: Right  Left Main  20-30% distal left main narrowing.  Left Anterior Descending  95% calcified proximal LAD stenosis. The LAD provides robust collaterals to the RCA. Luminal irregularities throughout the rest of the LAD.  Ramus Intermedius  60-70% stenosis proximal LCx.  Right Coronary Artery  Totally occluded proximal RCA (known from past cath). There are robust collaterals from the LAD to the RCA territory.   Intervention   No interventions have been documented.  Right Heart  Right Heart Pressures RHC Procedural Findings: Hemodynamics (mmHg) RA mean 2 RV 23/1 PA 28/1 PCWP mean  5 AO 121/52  Oxygen saturations: PA 74% AO 100%  Cardiac Output (Fick) 5.88  Cardiac Index (Fick) 3.08   Implants    No implant documentation for this case.   Syngo Images  Show images for CARDIAC CATHETERIZATION  Images on Long Term Storage  Show images for [place], [place] to Procedure Log  Procedure Log     Hemo Data  Flowsheet Row Most Recent Value  Fick Cardiac Output 5.88 L/min  Fick Cardiac Output Index 3.08 (L/min)/BSA  RA A Wave 7 mmHg  RA V Wave 5 mmHg  RA Mean 2 mmHg  RV Systolic Pressure 23 mmHg  RV Diastolic Pressure -4 mmHg  RV EDP 1 mmHg  PA Systolic Pressure 28 mmHg  PA Diastolic Pressure 1 mmHg  PA Mean 15 mmHg  PW A Wave 7 mmHg  PW V Wave 8 mmHg  PW Mean 5 mmHg  AO Systolic Pressure 623 mmHg  AO  Diastolic Pressure 52 mmHg  AO Mean 81 mmHg  QP/QS 1  TPVR Index 4.86 HRUI  TSVR Index 26.26 HRUI  PVR SVR Ratio 0.13  TPVR/TSVR Ratio 0.19   Addendum    ADDENDUM REPORT: 04/05/2020 13:29  CLINICAL DATA:  80 year old male with severe aortic stenosis being evaluated for a TAVR procedure.  EXAM: Cardiac TAVR CT  TECHNIQUE: The patient was scanned on a [employer]. A 120 kV retrospective scan was triggered in the descending thoracic aorta at 111 HU's. Gantry rotation speed was 250 msecs and collimation was .6 mm. 25 mg of PO Metoprolol or nitro were given. The 3D data set was reconstructed in 5% intervals of the R-R cycle. Systolic and diastolic phases were analyzed on a dedicated work station using MPR, MIP and VRT modes. The patient received 80 cc of contrast.  FINDINGS: Aortic Valve: Tri-leaflet aortic valve with severely calcified and thickened leaflets, severely restricted leaflet opening and asymmetric calcifications extending into the LVOT under the left coronary cusp.  Aorta: Normal size, moderate diffuse atherosclerotic plaque with calcifications.  Sinotubular Junction: 32 x 32 mm  Ascending Thoracic Aorta: 38 x 37 mm  Aortic Arch: 33 x 31 mm  Descending Thoracic Aorta: 27 x 25 mm  Sinus of Valsalva Measurements:  Non-coronary: 38 mm  Right -coronary: 38 mm  Left -coronary:  38 mm  Coronary Artery Height above Annulus:  Left Main: 16 mm  Right Coronary: 21 mm  Virtual Basal Annulus Measurements:  Maximum/Minimum Diameter: 29.8 x 24.4 mm  Mean Diameter: 26.9 mm  Perimeter: 86.6 mm  Area: 569 mm2  Optimum Fluoroscopic Angle for Delivery: LAO 5 CAU 4  IMPRESSION: 1. Tri-leaflet aortic valve with severely calcified and thickened leaflets, severely restricted leaflet opening and asymmetric calcifications extending into the LVOT under the left coronary cusp. Aortic valve calcium score is 5106 consistent with  severe aortic stenosis. Annular measurements suitable for delivery of a 29 mm Edwards-SAPIEN 3 Ultra valve.  2. Sufficient coronary to annulus distance.  3. Optimum Fluoroscopic Angle for Delivery: LAO 5 CAU 4.  4. No thrombus in the left atrial appendage.   Electronically Signed   By: Ena Dawley   On: 04/05/2020 13:29     Narrative & Impression  CLINICAL DATA:  80 year old male with history of severe aortic stenosis. Preprocedural study prior to potential transcatheter aortic valve replacement (TAVR) procedure.  EXAM: CT ANGIOGRAPHY CHEST, ABDOMEN AND PELVIS  TECHNIQUE: Multidetector CT imaging through the chest, abdomen and pelvis was performed using the standard protocol  during bolus administration of intravenous contrast. Multiplanar reconstructed images and MIPs were obtained and reviewed to evaluate the vascular anatomy.  CONTRAST:  165mL OMNIPAQUE IOHEXOL 350 MG/ML SOLN  COMPARISON:  Chest CT 05/29/2018. CT the abdomen and pelvis 02/26/2017.  FINDINGS: CTA CHEST FINDINGS  Cardiovascular: Heart size is normal. There is no significant pericardial fluid, thickening or pericardial calcification. There is aortic atherosclerosis, as well as atherosclerosis of the great vessels of the mediastinum and the coronary arteries, including calcified atherosclerotic plaque in the left main, left anterior descending, left circumflex and right coronary arteries. Severe thickening calcification of the aortic valve. Extensive calcifications of the mitral-aortic intervalvular fibrosa also noted.  Mediastinum/Lymph Nodes: No pathologically enlarged mediastinal or hilar lymph nodes. Esophagus is unremarkable in appearance. No axillary lymphadenopathy.  Lungs/Pleura: No suspicious appearing pulmonary nodules or masses are noted. No acute consolidative airspace disease. No pleural effusions.  Musculoskeletal/Soft Tissues: There are no aggressive  appearing lytic or blastic lesions noted in the visualized portions of the skeleton.  CTA ABDOMEN AND PELVIS FINDINGS  Hepatobiliary: Liver has a nodular contour, concerning for underlying cirrhosis. No suspicious cystic or solid hepatic lesions. No intra or extrahepatic biliary ductal dilatation. Status post cholecystectomy.  Pancreas: No pancreatic mass. No pancreatic ductal dilatation. No pancreatic or peripancreatic fluid collections or inflammatory changes.  Spleen: Unremarkable.  Adrenals/Urinary Tract: Right kidney and bilateral adrenal glands are normal in appearance. Scarring in the lower pole of the left kidney. No hydroureteronephrosis. Urinary bladder is normal in appearance.  Stomach/Bowel: Normal appearance of the stomach. No pathologic dilatation of small bowel or colon. Numerous colonic diverticulae are noted, particularly in the descending colon and sigmoid colon, without surrounding inflammatory changes to suggest an acute diverticulitis at this time. Normal appendix.  Vascular/Lymphatic: Aortic atherosclerosis with fusiform aneurysmal dilatation of the infrarenal abdominal aorta which measures up to 5.0 x 4.2 cm in diameter. Vascular findings and measurements pertinent to potential TAVR procedure, as detailed below. No lymphadenopathy noted in the abdomen or pelvis.  Reproductive: Prostate gland and seminal vesicles are unremarkable in appearance.  Other: No significant volume of ascites.  No pneumoperitoneum.  Musculoskeletal: Interbody cages at L4-L5. There are no aggressive appearing lytic or blastic lesions noted in the visualized portions of the skeleton.  VASCULAR MEASUREMENTS PERTINENT TO TAVR:  AORTA:  Minimal Aortic Diameter-11 x 9 mm  Severity of Aortic Calcification-severe  RIGHT PELVIS:  Right Common Iliac Artery -  Minimal Diameter-7.2 x 5.3 mm  Tortuosity-severe  Calcification-moderate to severe  Right  External Iliac Artery -  Minimal Diameter-6.5 x 6.5 mm  Tortuosity-moderate  Calcification-moderate  Right Common Femoral Artery -  Minimal Diameter-6.4 x 7.3 mm  Tortuosity-mild  Calcification-moderate to severe  LEFT PELVIS:  Left Common Iliac Artery -  Minimal Diameter-7.2 x 5.9 mm  Tortuosity-severe  Calcification-moderate to severe  Left External Iliac Artery -  Minimal Diameter-6.8 x 7.0 mm  Tortuosity-moderate  Calcification-mild  Left Common Femoral Artery -  Minimal Diameter-8.6 x 8.4 mm  Tortuosity-mild  Calcification-moderate  Review of the MIP images confirms the above findings.  IMPRESSION: 1. Vascular findings and measurements pertinent to potential TAVR procedure, as detailed above. 2. Severe thickening calcification of the aortic valve, compatible with reported clinical history of severe aortic stenosis. 3. Aortic atherosclerosis, in addition to left main and 3 vessel coronary artery disease. There is also a large infrarenal abdominal aortic aneurysm which is increasing in size, currently measuring 5.0 x 4.2 cm in diameter (previously 4.3 cm on 02/26/2017). Recommend  referral to a vascular specialist. This recommendation follows ACR consensus guidelines: White Paper of the ACR Incidental Findings Committee II on Vascular Findings. J Am Coll Radiol 2013; 10:789-794. 4. Morphologic changes in the liver indicative of underlying cirrhosis. 5. Colonic diverticulosis without evidence of acute diverticulitis at this time. 6. Additional incidental findings, as above.   Electronically Signed   By: Vinnie Langton M.D.   On: 04/06/2020 08:00    Procedure: AVR + CAB  Risk of Mortality:  3.932%  Renal Failure:  2.742%  Permanent Stroke:  2.494%  Prolonged Ventilation:  11.115%  DSW Infection:  0.293%  Reoperation:  8.024%  Morbidity or Mortality:  21.483%  Short Length of Stay:  20.586%  Long Length of  Stay:  13.072%    Impression:  This 80 year old gentleman has stage D, severe, symptomatic aortic stenosis with New York Heart Association class II symptoms of exertional fatigue and shortness of breath consistent with chronic diastolic congestive heart failure.  He is also having some exertional chest pressure.  I have personally reviewed his 2D echocardiogram, cardiac catheterization, and CTA studies.  His echocardiogram shows a trileaflet aortic valve with severe calcification and restricted leaflet mobility with a mean gradient of 50 mmHg and a valve area of 0.96 cm.  His left ventricular ejection fraction is 60 to 65% with grade 2 diastolic dysfunction.  Cardiac catheterization shows a known chronic occlusion of the RCA with brisk collaterals from the LAD.  The LAD has a 95% calcified proximal stenosis that is compromising the large LAD system as well as the collaterals to the RCA.  The left circumflex has a 60 to 70% long proximal stenosis.  Right heart pressures were normal with a normal cardiac output.  I agree that aortic valve replacement and coronary revascularization is indicated in this patient to prevent progressive left ventricular deterioration and myocardial loss.  Given his advanced age and comorbid risk factors I think that PCI of his LAD and left circumflex followed by TAVR in 4 to 6 weeks would be the best treatment option for him.  His gated cardiac CTA shows anatomy suitable for transcatheter aortic valve replacement using a 29 mm Sapien 3 valve.  His abdominal and pelvic CTA shows a 5 cm infrarenal abdominal aortic aneurysm that is increased in size from 4.3 cm in January 2019.  He also has significant calcific atherosclerosis of the abdominal aorta and iliacs.  This would certainly increase the risk of transfemoral insertion.  His left subclavian artery appears to be of adequate size for insertion of a 16 French sheath and this would probably be the best option.  He already has an  appointment set up to see Dr. Servando Snare for vascular surgery evaluation of his abdominal aortic aneurysm.  The patient and his wife were counseled at length regarding treatment alternatives for management of severe symptomatic aortic stenosis. The risks and benefits of surgical intervention has been discussed in detail. Long-term prognosis with medical therapy was discussed. Alternative approaches such as conventional surgical aortic valve replacement, transcatheter aortic valve replacement, and palliative medical therapy were compared and contrasted at length. This discussion was placed in the context of the patient's own specific clinical presentation and past medical history. All of their questions have been addressed.   Following the decision to proceed with transcatheter aortic valve replacement, a discussion was held regarding what types of management strategies would be attempted intraoperatively in the event of life-threatening complications, including whether or not the patient would be  considered a candidate for the use of cardiopulmonary bypass and/or conversion to open sternotomy for attempted surgical intervention.  I think he would be a candidate for emergent sternotomy to manage any intraoperative complications.  The patient is aware of the fact that transient use of cardiopulmonary bypass may be necessary. The patient has been advised of a variety of complications that might develop including but not limited to risks of death, stroke, paravalvular leak, aortic dissection or other major vascular complications, aortic annulus rupture, device embolization, cardiac rupture or perforation, mitral regurgitation, acute myocardial infarction, arrhythmia, heart block or bradycardia requiring permanent pacemaker placement, congestive heart failure, respiratory failure, renal failure, pneumonia, infection, other late complications related to structural valve deterioration or migration, or other  complications that might ultimately cause a temporary or permanent loss of functional independence or other long term morbidity. The patient provides full informed consent for the procedure as described and all questions were answered.    Plan:  He will be scheduled for PCI followed by TAVR in 4 to 6 weeks.  His case will be reviewed by our multidisciplinary heart valve team but I would probably plan on using a left subclavian approach.  He already has an appointment with Dr. Donzetta Matters for vascular surgery evaluation.  I spent 80 minutes performing this consultation and > 50% of this time was spent face to face counseling and coordinating the care of this patient's severe symptomatic aortic stenosis.   Gaye Pollack, MD 04/12/2020

## 2020-04-12 NOTE — Telephone Encounter (Signed)
Brandon [name] called to talk with [name].  He is going to meet with a heart surgeon today to talk about repairing one of his valves and a bypass of one of his vessels. He would like to speak with [name].  He will not be available for an appointment.  His # is [phone].

## 2020-04-13 ENCOUNTER — Encounter: Payer: Self-pay | Admitting: Surgery

## 2020-04-14 ENCOUNTER — Encounter: Payer: Medicare Other | Admitting: Surgery

## 2020-04-14 NOTE — Telephone Encounter (Signed)
I spoke with pt- he just wanted ot make sure he can get his regular pain meds, which I agreed, yes, and I wished him well in getting through surgeries successfully! He can see me after done with surgeries for valve, bypass and AAA.

## 2020-04-14 NOTE — Telephone Encounter (Signed)
I called patient, patient stated he is on 6 mg prednisone and experiencing some shoulder pain, patient is having surgery mid March, patient will call if symptoms worsen.

## 2020-04-18 ENCOUNTER — Other Ambulatory Visit: Payer: Self-pay

## 2020-04-18 MED ORDER — OXYCODONE HCL 5 MG PO TABS: 5.0000 mg | ORAL_TABLET | Freq: Four times a day (QID) | ORAL | 0 refills | Status: DC | PRN

## 2020-04-19 ENCOUNTER — Other Ambulatory Visit: Payer: Self-pay

## 2020-04-19 ENCOUNTER — Telehealth: Payer: Self-pay | Admitting: Physical Medicine and Rehabilitation

## 2020-04-19 ENCOUNTER — Encounter: Payer: Self-pay | Admitting: Family Medicine

## 2020-04-19 ENCOUNTER — Ambulatory Visit (INDEPENDENT_AMBULATORY_CARE_PROVIDER_SITE_OTHER): Payer: Medicare Other | Admitting: Family Medicine

## 2020-04-19 VITALS — BP 110/60 | HR 72 | Temp 96.8°F | Ht 67.0 in | Wt 173.0 lb

## 2020-04-19 DIAGNOSIS — I2510 Atherosclerotic heart disease of native coronary artery without angina pectoris: Secondary | ICD-10-CM

## 2020-04-19 DIAGNOSIS — M353 Polymyalgia rheumatica: Secondary | ICD-10-CM

## 2020-04-19 DIAGNOSIS — I10 Essential (primary) hypertension: Secondary | ICD-10-CM | POA: Diagnosis not present

## 2020-04-19 DIAGNOSIS — E7849 Other hyperlipidemia: Secondary | ICD-10-CM

## 2020-04-19 DIAGNOSIS — I350 Nonrheumatic aortic (valve) stenosis: Secondary | ICD-10-CM | POA: Diagnosis not present

## 2020-04-19 DIAGNOSIS — I7143 Infrarenal abdominal aortic aneurysm, without rupture: Secondary | ICD-10-CM

## 2020-04-19 DIAGNOSIS — G894 Chronic pain syndrome: Secondary | ICD-10-CM

## 2020-04-19 DIAGNOSIS — I714 Abdominal aortic aneurysm, without rupture: Secondary | ICD-10-CM

## 2020-04-19 DIAGNOSIS — Z87438 Personal history of other diseases of male genital organs: Secondary | ICD-10-CM

## 2020-04-19 DIAGNOSIS — C090 Malignant neoplasm of tonsillar fossa: Secondary | ICD-10-CM

## 2020-04-19 NOTE — Telephone Encounter (Signed)
I refilled his meds yesterday- can you check on this, please- ML

## 2020-04-19 NOTE — Progress Notes (Signed)
[facility] [address] [place] [address] Dept: [phone] Dept Fax: [phone]  New Patient Office Visit  Subjective:    Patient ID: Brandon [name], male    DOB: July 17, 1940, 80 y.o..   MRN: [id number]  Chief Complaint  Patient presents with  . Establish Care    Pt here for TOC.  UTD on vaccines.     History of Present Illness:  Patient is in today to establish care. Brandon [name] up near [place], [place]. He is a retired Chief Strategy Officer. He is married, with 4 children, 1 grandchild, and 2 great-grandchildren.  Brandon [name] has a history of having developed a liver abscess in the early 2000s. He underwent long-term antibiotic therapy through a PICC line and it has since resolved.  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 He underwent surgery, which include removing a portion of his tongue. He had 33 radiation treatments to the area. He notes that he is doing well now overall, though he has some residual issue with nasal mucous production.  Brandon [name] currently has issues with coronary artery disease (proximal RCA occlusion with robust right to left collaterals, and a 50% proximal LAD stenosis). He will be undergoing a cardiac cath with stent placement in 2 vessels later in March. He also is known to have a severe aortic stenosis. Approximately 4-5 weeks after his cath, he will undergo a transcatheter aortic valve repair. Then, after another 4 weeks, there are plans for him to undergo repair of his abdominal aortic aneurysm.  Brandon [name] has a history of chronic low back pain s/p multiple spinal fusions. He is currently managed by a pain specialist. He currently takes Oxycodone 5 mg IR 3-4 times a day, tramadol 50 mg bid, and gabapentin 300 mg TID.  Past Medical History: Patient Active Problem List   Diagnosis Date Noted  . 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 Primary osteoarthritis of  both hands 01/11/2020  . Piriformis syndrome of both sides 09/23/2019  . Pain of both shoulder joints 07/29/2019  . Chronic right hip pain 07/29/2019  . Sciatica associated with disorder of lumbar spine 04/24/2019  . Primary osteoarthritis involving multiple joints 04/24/2019  . Chronic pain syndrome 04/24/2019  . Ankyloglossia 06/23/2018  . Encounter for chronic pain management 04/15/2018  . Anxiety and depression 04/15/2018  . 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 Cancer of tonsillar fossa ([place]) 09/20/2017  . Bruising 10/02/2016  . 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 Aneurysm of infrarenal abdominal aorta ([facility]) 07/10/2016  . Normocytic anemia 07/10/2016  . Tobacco abuse disorder 01/10/2014  . Seasonal allergies 11/23/2013  . Essential hypertension 05/19/2013  . Osteoarthritis 05/29/2009  . Psoriasis 01/13/2009  . Coronary artery disease 09/15/2008  . Aortic stenosis 01/28/2008  . 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 Diverticulosis of colon 08/27/2007  . History of benign prostatic hyperplasia 08/27/2007  . Hyperlipidemia 03/19/2007  . Anxiety state 03/19/2007  . Gastroesophageal reflux disease 03/19/2007   Past Surgical History:  Procedure Laterality Date  . BACK SURGERY    . CATARACT EXTRACTION W/ INTRAOCULAR LENS  IMPLANT, BILATERAL Bilateral 01/2012 - 02/2012  . DIRECT LARYNGOSCOPY Left 10/09/2017   Procedure: DIRECT LARYNGOSCOPY WITH BOPSY;  Surgeon: Jodi Marble, MD;  Location: [facility];  Service: ENT;  Laterality: Left;  . ESOPHAGOGASTRODUODENOSCOPY (EGD) WITH ESOPHAGEAL DILATION     "couple times" (07/19/2016)  . ESOPHAGOSCOPY Left 10/09/2017  Procedure: ESOPHAGOSCOPY;  Surgeon: Jodi Marble, MD;  Location: [facility];  Service: ENT;  Laterality: Left;  . IR GASTROSTOMY TUBE MOD SED  01/08/2018  . IR THORACENTESIS ASP PLEURAL SPACE W/IMG GUIDE  07/19/2016  . LAPAROSCOPIC CHOLECYSTECTOMY  1994  . LUMBAR DISC SURGERY  05/1996   L4-5; Dr.  Sherwood Gambler   . LUMBAR LAMINECTOMY/DECOMPRESSION MICRODISCECTOMY  10/2002   L3-4. Dr. Sherwood Gambler  . MULTIPLE TOOTH EXTRACTIONS  1980s  . PARTIAL GLOSSECTOMY  12/02/2017   Dr. Nicolette [facility]  . pharyngoplasty for closure of tingue base defect  12/02/2017   Dr. Nicolette Bang- [facility]  . PICC LINE INSERTION  07/15/2016  . POSTERIOR LUMBAR FUSION  09/1996   Ray cage, L4-5 Dr. Rita Ohara  . PROSTATE BIOPSY  ~ 2017  . radical tonsillectomy Left 12/02/2017   Dr. Nicolette Bang at [facility]  . RIGHT HEART CATH AND CORONARY ANGIOGRAPHY N/A 03/31/2020   Procedure: RIGHT HEART CATH AND CORONARY ANGIOGRAPHY;  Surgeon: Larey Dresser, MD;  Location: [facility];  Service: Cardiovascular;  Laterality: N/A;  . RIGID BRONCHOSCOPY Left 10/09/2017   Procedure: RIGID BRONCHOSCOPY;  Surgeon: Jodi Marble, MD;  Location: [facility];  Service: ENT;  Laterality: Left;  . TRACHEOSTOMY  12/02/2017   Dr. Nicolette Bang- [facility]   Family History  Problem Relation Age of Onset  . Heart disease Father 3       Living  . Coronary artery disease Father        CABG  . Alzheimer's disease Mother 57       Deceased  . Arthritis Mother   . Aneurysm Brother   . Stomach cancer Maternal Uncle   . Brain cancer Maternal Aunt        x2  . Obesity Daughter        Had Bypass Sx   Outpatient Medications Prior to Visit  Medication Sig Dispense Refill  . amLODipine (NORVASC) 10 MG tablet Take 1 tablet (10 mg total) by mouth daily. 90 tablet 3  . aspirin 81 MG EC tablet Take 81 mg by mouth at bedtime.    Marland Kitchen atorvastatin (LIPITOR) 80 MG tablet Take 1 tablet (80 mg total) by mouth daily. 90 tablet 3  . citalopram (CELEXA) 20 MG tablet Take 1 tablet (20 mg total) by mouth daily. 30 tablet 5  . fluticasone (FLONASE) 50 MCG/ACT nasal spray Place 2 sprays into both nostrils daily as needed for rhinitis or allergies.    Marland Kitchen gabapentin (NEURONTIN) 300 MG capsule TAKE 1 CAPSULE(300 MG) BY MOUTH THREE TIMES DAILY (Patient taking differently:  Take 300 mg by mouth 3 (three) times daily as needed (pain).) 90 capsule 5  . isosorbide mononitrate (IMDUR) 30 MG 24 hr tablet TAKE 1 TABLET(30 MG) BY MOUTH AT BEDTIME (Patient taking differently: Take 30 mg by mouth daily.) 90 tablet 1  . lisinopril (ZESTRIL) 10 MG tablet TAKE 1 TABLET(10 MG) BY MOUTH DAILY (Patient taking differently: Take 10 mg by mouth daily.) 90 tablet 3  . nicotine (NICODERM CQ - DOSED IN MG/24 HOURS) 21 mg/24hr patch Place 21 mg onto the skin daily.    . Nutritional Supplements (PROTEIN SUPPLEMENT 80% PO) Take 11 oz by mouth daily. Premier Protein chocolate    . omeprazole (PRILOSEC) 20 MG capsule TAKE 1 CAPSULE(20 MG) BY MOUTH DAILY (Patient taking differently: Take 20 mg by mouth daily.) 90 capsule 2  . oxyCODONE (OXY IR/ROXICODONE) 5 MG immediate release tablet Take 1-2 tablets (5-10 mg total) by mouth every  6 (six) hours as needed for severe pain (to take 2 tabs ONLY when severe pain- which is not usual for pt). 120 tablet 0  . predniSONE (DELTASONE) 1 MG tablet Take 1 tablet by mouth daily with the 5 mg tablet (total 6 mg daily). 30 tablet 0  . predniSONE (DELTASONE) 5 MG tablet Take 1 tablet (5 mg total) by mouth daily with breakfast. 30 tablet 0  . tamsulosin (FLOMAX) 0.4 MG CAPS capsule Take 0.8 mg by mouth daily.    . traMADol (ULTRAM) 50 MG tablet Take 2 tablets (100 mg total) by mouth 2 (two) times daily. (Patient taking differently: Take 50 mg by mouth 2 (two) times daily.) 120 tablet 5  . carvedilol (COREG) 6.25 MG tablet Take 1 tablet (6.25 mg total) by mouth 2 (two) times daily. 60 tablet 11   No facility-administered medications prior to visit.   Allergies  Allergen Reactions  . Celebrex [Celecoxib] Hives and Itching    Objective:   Today's Vitals   04/19/20 1112  BP: 110/60  Pulse: 72  Temp: (!) 96.8 F (36 C)  TempSrc: Temporal  SpO2: 94%  Weight: 173 lb (78.5 kg)  Height: 5\' 7"  (1.702 m)   Body mass index is 27.1 kg/m.   General: Well  developed, well nourished. No acute distress. Psych: Alert and oriented. Normal mood and affect.  Health Maintenance Due  Topic Date Due  . COLONOSCOPY (Pts 45-51yrs Insurance coverage will need to be confirmed)  03/22/2020     Assessment & Plan:   1. Aortic valve stenosis, etiology of cardiac valve disease unspecified I reviewed extensive notes from cardiology and cardiothoracic surgeon in regards to cardiovascular status and plans for management. TAVR is being targeted for later in April.  2. Coronary artery disease involving native coronary artery of native heart without angina pectoris Plan is to place two stents later this month.  3. Aneurysm of infrarenal abdominal aorta ([place]) Plan is for AAA repair later in the Spring.  4. Essential hypertension BP is normal today. Currently managed on amlodipine, carvedilol, and lisinopril.  5. Other hyperlipidemia Recently had atorvastatin dose increased to 80 mg daily. Target LDL is < 70.  6. Polymyalgia rheumatica (H[facility]) On a tapering dose of prednisone, managed by rheumatologist.  7. Cancer of tonsillar fossa ([place]) Stable and appears to be cancer free.  8. History of benign prostatic hyperplasia On Flomax. Doing well.  9. Chronic pain syndrome Managed on oxycodone, tramadol, and gabapentin under care of pain specialist.  Haydee Salter, MD

## 2020-04-19 NOTE — Telephone Encounter (Signed)
Patient states he is down to last two hydrocodone, has apt for 3/21

## 2020-04-19 NOTE — Telephone Encounter (Signed)
I spoke with Brandon [name] and pharmacy has called and said it is ready for pick up.

## 2020-04-20 ENCOUNTER — Encounter: Payer: Self-pay | Admitting: Cardiovascular Disease

## 2020-04-20 ENCOUNTER — Ambulatory Visit: Payer: Medicare Other | Admitting: Cardiovascular Disease

## 2020-04-20 VITALS — BP 120/72 | HR 63 | Ht 67.5 in | Wt 173.0 lb

## 2020-04-20 DIAGNOSIS — I350 Nonrheumatic aortic (valve) stenosis: Secondary | ICD-10-CM

## 2020-04-20 MED ORDER — PANTOPRAZOLE SODIUM 40 MG PO TBEC: 40.0000 mg | DELAYED_RELEASE_TABLET | Freq: Every day | ORAL | 3 refills | Status: DC

## 2020-04-20 MED ORDER — CLOPIDOGREL BISULFATE 75 MG PO TABS: 75.0000 mg | ORAL_TABLET | Freq: Every day | ORAL | 3 refills | Status: DC

## 2020-04-20 NOTE — Progress Notes (Signed)
[facility]   MULTIDISCIPLINARY HEART VALVE TEAM  Date:  04/20/2020   ID:  Brandon [name], DOB 1941-01-27, MRN [id number]  PCP:  Haydee Salter, MD   Chief Complaint  Patient presents with  . Coronary Artery Disease  . Aortic Stenosis     HISTORY OF PRESENT ILLNESS: Brandon [name] is a 80 y.o. male who presents for evaluation of severe aortic stenosis, referred by Dr Aundra Dubin.   The patient has a history of coronary artery disease, hypertension, and mixed hyperlipidemia.  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  The patient has been followed for aortic stenosis with surveillance echo studies and close clinical care by Dr. Algernon Huxley.  He has developed progressive and now severe aortic stenosis with mean gradient of 50 mmHg and calculated valve area of 0.96 cm.  LVEF remains preserved at 60 to 65%.  He underwent cardiac catheterization for preoperative assessment and was demonstrated to have chronic occlusion of the right coronary artery with left-to-right collaterals, severe calcific proximal LAD stenosis, and moderately severe long segment mid circumflex stenosis.  He has undergone formal cardiac surgical evaluation by Dr. Cyndia Bent, and presents today for further multidisciplinary assessment.  The patient is here alone today. He is still trying hard to quit smoking, now using a nicotine patch. He reports having a heart murmur for over 40 years. The patient worked in [employer] business most of his life. Over the past few years he has had exertional dyspnea and chest discomfort. It's hard for him to characterize, but he thinks his symptoms are worse over the past year. No orthopnea, PND, or leg swelling.  Chest pain is described as an uncomfortable feeling across the substernal region.  The patient has had regular  dental care and reports full dentures.  Past Medical History:  Diagnosis Date  . Aortic stenosis    mild echocardiogram 8/09 EF 60%, showed no regional wall motion abnml, mild LVH, mod focal basal septal hypertrophy and mild dyastolic dysfunction. 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 mean gradient across aortic valve was 8 mmHG. also mild L atrial enlargement and normal RV size and function. minimal AS on LHC in 7/10.   . Arthritis    "all over" (07/19/2016)  . BPH (benign prostatic hypertrophy)   . CAD (coronary artery disease)    LCH (7/10) totally occluded proximal RCA with very robuse L to R collaterals, 50% proximal LAD stenosis, EF 65%, medical management.    . CHF (congestive heart failure) ([place])   . Chicken pox   . Chronic lower back pain    s/p surgical fusion  . Diverticulosis   . Esophageal stricture   . GERD (gastroesophageal reflux disease)   . Heart murmur   . Hepatitis B 1984  . Hiatal hernia   . History of radiation therapy 01/16/18- 03/05/18   Left Tonsil, 66 Gy in 33 fractions to high risk nodal echelons.   Marland Kitchen HLD (hyperlipidemia)   . HTN (hypertension)   . 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 Osteoarthritis   . TIA (transient ischemic attack) 1990s   hx  . tonsillar ca dx'd 11/2017  . Tubular adenoma of colon 2009    Current Outpatient Medications  Medication Sig Dispense Refill  . amLODipine (NORVASC) 10 MG tablet Take 1 tablet (10 mg total) by mouth daily. 90 tablet  3  . aspirin 81 MG EC tablet Take 81 mg by mouth at bedtime.    Marland Kitchen atorvastatin (LIPITOR) 80 MG tablet Take 1 tablet (80 mg total) by mouth daily. 90 tablet 3  . carvedilol (COREG) 6.25 MG tablet Take 1 tablet (6.25 mg total) by mouth 2 (two) times daily. 60 tablet 11  . citalopram (CELEXA) 20 MG tablet Take 1 tablet (20 mg total) by mouth daily. 30 tablet 5  . fluticasone (FLONASE) 50 MCG/ACT nasal spray Place 2 sprays into both nostrils daily as needed for  rhinitis or allergies.    Marland Kitchen gabapentin (NEURONTIN) 300 MG capsule TAKE 1 CAPSULE(300 MG) BY MOUTH THREE TIMES DAILY 90 capsule 5  . isosorbide mononitrate (IMDUR) 30 MG 24 hr tablet TAKE 1 TABLET(30 MG) BY MOUTH AT BEDTIME 90 tablet 1  . lisinopril (ZESTRIL) 10 MG tablet TAKE 1 TABLET(10 MG) BY MOUTH DAILY 90 tablet 3  . nicotine (NICODERM CQ - DOSED IN MG/24 HOURS) 21 mg/24hr patch Place 21 mg onto the skin daily.    . Nutritional Supplements (PROTEIN SUPPLEMENT 80% PO) Take 11 oz by mouth daily. Premier Protein chocolate    . omeprazole (PRILOSEC) 20 MG capsule TAKE 1 CAPSULE(20 MG) BY MOUTH DAILY 90 capsule 2  . oxyCODONE (OXY IR/ROXICODONE) 5 MG immediate release tablet Take 1-2 tablets (5-10 mg total) by mouth every 6 (six) hours as needed for severe pain (to take 2 tabs ONLY when severe pain- which is not usual for pt). 120 tablet 0  . predniSONE (DELTASONE) 1 MG tablet Take 1 tablet by mouth daily with the 5 mg tablet (total 6 mg daily). 30 tablet 0  . predniSONE (DELTASONE) 5 MG tablet Take 1 tablet (5 mg total) by mouth daily with breakfast. 30 tablet 0  . tamsulosin (FLOMAX) 0.4 MG CAPS capsule Take 0.8 mg by mouth daily.    . traMADol (ULTRAM) 50 MG tablet Take 2 tablets (100 mg total) by mouth 2 (two) times daily. 120 tablet 5   No current facility-administered medications for this visit.    ALLERGIES:   Celebrex [celecoxib]   SOCIAL HISTORY:  The patient  reports that he quit smoking about 2 years ago. His smoking use included cigarettes. He has a 75.00 pack-year smoking history. He has never used smokeless tobacco. He reports current alcohol use. He reports that he does not use drugs.   FAMILY HISTORY:  The patient's family history includes Alzheimer's disease (age of onset: 80) in his mother; Aneurysm in his brother; Arthritis in his mother; Brain cancer in his maternal aunt; Coronary artery disease in his father; Heart disease (age of onset: 60) in his father; Obesity in his  daughter; Stomach cancer in his maternal uncle.   REVIEW OF SYSTEMS:  Positive for arthritis, muscle aches, anxiety, hearing loss, easy bruising.   All other systems are reviewed and negative.   PHYSICAL EXAM: VS:  BP 120/72   Pulse 63   Ht 5' 7.5" (1.715 m)   Wt 173 lb (78.5 kg)   SpO2 95%   BMI 26.70 kg/m  , BMI Body mass index is 26.7 kg/m. GEN: Well nourished, well developed, pleasant elderly male in no acute distress HEENT: normal Neck: No JVD. carotids 2+ with bilateral bruits Cardiac: The heart is RRR with 3/6 harsh late peaking systolic murmur at the right upper sternal border. No edema. Pedal pulses 2+ = bilaterally  Respiratory: Scattered rhonchi bilaterally GI: soft, nontender, nondistended, + BS MS: no deformity or atrophy  Skin: warm and dry, no rash Neuro:  Strength and sensation are intact Psych: euthymic mood, full affect  EKG:  EKG from today reviewed and demonstrates normal sinus rhythm 63 bpm, LVH with repolarization abnormality  RECENT LABS: 01/11/2020: ALT 6 03/24/2020: BUN 14; Creatinine, Ser 0.68; Platelets 159 03/25/2020: TSH 1.569 03/31/2020: Hemoglobin 11.6; Hemoglobin 11.6; Potassium 3.2; Potassium 3.2; Sodium 142; Sodium 140  03/24/2020: Cholesterol 166; HDL 53; LDL Cholesterol 98; Total CHOL/HDL Ratio 3.1; Triglycerides 76; VLDL 15   CrCl cannot be calculated (Patient's most recent lab result is older than the maximum 21 days allowed.).   Wt Readings from Last 3 Encounters:  04/20/20 173 lb (78.5 kg)  04/19/20 173 lb (78.5 kg)  03/31/20 173 lb 1 oz (78.5 kg)     CARDIAC STUDIES:  Echo:  IMPRESSIONS    1. Left ventricular ejection fraction, by estimation, is 60 to 65%. The  left ventricle has normal function. The left ventricle has no regional  wall motion abnormalities. There is mild left ventricular hypertrophy.  Left ventricular diastolic parameters  are consistent with Grade II diastolic dysfunction (pseudonormalization).  2. Right  ventricular systolic function is normal. The right ventricular  size is normal. There is mildly elevated pulmonary artery systolic  pressure. The estimated right ventricular systolic pressure is 50.9 mmHg.  3. Left atrial size was moderately dilated.  4. The mitral valve is degenerative. No evidence of mitral valve  regurgitation. No evidence of mitral stenosis.  5. The aortic valve is tricuspid. Aortic valve regurgitation is mild.  Severe aortic valve stenosis. Aortic valve area, by VTI measures 0.96 cm.  Aortic valve mean gradient measures 50.0 mmHg.  6. Aortic dilatation noted. There is mild dilatation of the ascending  aorta, measuring 42 mm.  7. The inferior vena cava is normal in size with greater than 50%  respiratory variability, suggesting right atrial pressure of 3 mmHg.   FINDINGS  Left Ventricle: Left ventricular ejection fraction, by estimation, is 60  to 65%. The left ventricle has normal function. The left ventricle has no  regional wall motion abnormalities. The left ventricular internal cavity  size was normal in size. There is  mild left ventricular hypertrophy. Left ventricular diastolic parameters  are consistent with Grade II diastolic dysfunction (pseudonormalization).   Right Ventricle: The right ventricular size is normal. No increase in  right ventricular wall thickness. Right ventricular systolic function is  normal. There is mildly elevated pulmonary artery systolic pressure. The  tricuspid regurgitant velocity is 3.22  m/s, and with an assumed right atrial pressure of 3 mmHg, the estimated  right ventricular systolic pressure is 32.6 mmHg.   Left Atrium: Left atrial size was moderately dilated.   Right Atrium: Right atrial size was normal in size.   Pericardium: There is no evidence of pericardial effusion.   Mitral Valve: The mitral valve is degenerative in appearance. Mild to  moderate mitral annular calcification. No evidence of mitral valve   regurgitation. No evidence of mitral valve stenosis.   Tricuspid Valve: The tricuspid valve is normal in structure. Tricuspid  valve regurgitation is trivial.   Aortic Valve: The aortic valve is tricuspid. Aortic valve regurgitation is  mild. Aortic regurgitation PHT measures 658 msec. Severe aortic stenosis  is present. Aortic valve mean gradient measures 50.0 mmHg. Aortic valve  peak gradient measures 70.4 mmHg.  Aortic valve area, by VTI measures 0.96 cm.   Pulmonic Valve: The pulmonic valve was normal in structure. Pulmonic valve  regurgitation is not  visualized.   Aorta: The aortic root is normal in size and structure and aortic  dilatation noted. There is mild dilatation of the ascending aorta,  measuring 42 mm.   Venous: The inferior vena cava is normal in size with greater than 50%  respiratory variability, suggesting right atrial pressure of 3 mmHg.   IAS/Shunts: No atrial level shunt detected by color flow Doppler.     LEFT VENTRICLE  PLAX 2D  LVIDd:     5.50 cm Diastology  LVIDs:     4.10 cm LV e' medial:  5.87 cm/s  LV PW:     1.00 cm LV E/e' medial: 15.0  LV IVS:    1.10 cm LV e' lateral:  8.49 cm/s  LVOT diam:   2.00 cm LV E/e' lateral: 10.3  LV SV:     98  LV SV Index:  51  LVOT Area:   3.14 cm     RIGHT VENTRICLE  RV S prime:   12.20 cm/s  TAPSE (M-mode): 2.4 cm   LEFT ATRIUM       Index    RIGHT ATRIUM      Index  LA diam:    4.30 cm 2.24 cm/m RA Area:   11.60 cm  LA Vol (A2C):  88.5 ml 46.17 ml/m RA Volume:  18.60 ml 9.70 ml/m  LA Vol (A4C):  94.3 ml 49.20 ml/m  LA Biplane Vol: 97.0 ml 50.60 ml/m  AORTIC VALVE  AV Area (Vmax):  0.92 cm  AV Area (Vmean):  0.95 cm  AV Area (VTI):   0.96 cm  AV Vmax:      419.40 cm/s  AV Vmean:     319.400 cm/s  AV VTI:      1.029 m  AV Peak Grad:   70.4 mmHg  AV Mean Grad:   50.0 mmHg  LVOT Vmax:     123.00  cm/s  LVOT Vmean:    96.600 cm/s  LVOT VTI:     0.313 m  LVOT/AV VTI ratio: 0.30  AI PHT:      658 msec    AORTA  Ao Root diam: 3.50 cm  Ao Asc diam: 4.05 cm   MITRAL VALVE        TRICUSPID VALVE  MV Area (PHT): 3.53 cm  TR Peak grad:  41.5 mmHg  MV Decel Time: 215 msec  TR Vmax:    322.00 cm/s  MV E velocity: 87.80 cm/s  MV A velocity: 70.70 cm/s SHUNTS  MV E/A ratio: 1.24    Systemic VTI: 0.31 m               Systemic Diam: 2.00 cm    Cardiac Cath:  Conclusion  1. Low filling pressures.  2. Preserved cardiac output.  3. Known occluded RCA with robust collaterals from LAD.  4. 95% calcified proximal LAD stenosis (LAD provides RCA collaterals).  5. 60-70% proximal LCx stenosis.   Patient has known severe AS (from echo).  I reviewed films with Drs Angelena Form and Burt Knack.  PCI-TAVR would be an option, would likely need atherectomy to address LAD and then PCI LCx.  SAVR-CABG would also be an option, and may be safer in the long term as any compromise to an LAD stent would affect both LAD and RCA territory. Patient will be seen by cardiac surgeon and get scans for TAVR planning.    Diagnostic Dominance: Right  Left Main  20-30% distal left main narrowing.  Left Anterior Descending  95% calcified proximal LAD stenosis. The LAD provides robust collaterals to the RCA. Luminal irregularities throughout the rest of the LAD.  Ramus Intermedius  60-70% stenosis proximal LCx.  Right Coronary Artery  Totally occluded proximal RCA (known from past cath). There are robust collaterals from the LAD to the RCA territory.     Right Heart Pressures RHC Procedural Findings: Hemodynamics (mmHg) RA mean 2 RV 23/1 PA 28/1 PCWP mean 5 AO 121/52  Oxygen saturations: PA 74% AO 100%  Cardiac Output (Fick) 5.88  Cardiac Index (Fick) 3.08    CTA Heart: FINDINGS: Aortic Valve: Tri-leaflet aortic valve with severely calcified  and thickened leaflets, severely restricted leaflet opening and asymmetric calcifications extending into the LVOT under the left coronary cusp.  Aorta: Normal size, moderate diffuse atherosclerotic plaque with calcifications.  Sinotubular Junction: 32 x 32 mm  Ascending Thoracic Aorta: 38 x 37 mm  Aortic Arch: 33 x 31 mm  Descending Thoracic Aorta: 27 x 25 mm  Sinus of Valsalva Measurements:  Non-coronary: 38 mm  Right -coronary: 38 mm  Left -coronary:  38 mm  Coronary Artery Height above Annulus:  Left Main: 16 mm  Right Coronary: 21 mm  Virtual Basal Annulus Measurements:  Maximum/Minimum Diameter: 29.8 x 24.4 mm  Mean Diameter: 26.9 mm  Perimeter: 86.6 mm  Area: 569 mm2  Optimum Fluoroscopic Angle for Delivery: LAO 5 CAU 4  IMPRESSION: 1. Tri-leaflet aortic valve with severely calcified and thickened leaflets, severely restricted leaflet opening and asymmetric calcifications extending into the LVOT under the left coronary cusp. Aortic valve calcium score is 5106 consistent with severe aortic stenosis. Annular measurements suitable for delivery of a 29 mm Edwards-SAPIEN 3 Ultra valve.  2. Sufficient coronary to annulus distance.  3. Optimum Fluoroscopic Angle for Delivery: LAO 5 CAU 4.  4. No thrombus in the left atrial appendage.  CTA Chest/Abd/Pelvis: AORTA:  Minimal Aortic Diameter-11 x 9 mm  Severity of Aortic Calcification-severe  RIGHT PELVIS:  Right Common Iliac Artery -  Minimal Diameter-7.2 x 5.3 mm  Tortuosity-severe  Calcification-moderate to severe  Right External Iliac Artery -  Minimal Diameter-6.5 x 6.5 mm  Tortuosity-moderate  Calcification-moderate  Right Common Femoral Artery -  Minimal Diameter-6.4 x 7.3 mm  Tortuosity-mild  Calcification-moderate to severe  LEFT PELVIS:  Left Common Iliac Artery -  Minimal Diameter-7.2 x 5.9  mm  Tortuosity-severe  Calcification-moderate to severe  Left External Iliac Artery -  Minimal Diameter-6.8 x 7.0 mm  Tortuosity-moderate  Calcification-mild  Left Common Femoral Artery -  Minimal Diameter-8.6 x 8.4 mm  Tortuosity-mild  Calcification-moderate  Review of the MIP images confirms the above findings.  IMPRESSION: 1. Vascular findings and measurements pertinent to potential TAVR procedure, as detailed above. 2. Severe thickening calcification of the aortic valve, compatible with reported clinical history of severe aortic stenosis. 3. Aortic atherosclerosis, in addition to left main and 3 vessel coronary artery disease. There is also a large infrarenal abdominal aortic aneurysm which is increasing in size, currently measuring 5.0 x 4.2 cm in diameter (previously 4.3 cm on 02/26/2017). Recommend referral to a vascular specialist. This recommendation follows ACR consensus guidelines: White Paper of the ACR Incidental Findings Committee II on Vascular Findings. J Am Coll Radiol 2013; 10:789-794. 4. Morphologic changes in the liver indicative of underlying cirrhosis. 5. Colonic diverticulosis without evidence of acute diverticulitis at this time. 6. Additional incidental findings, as above.  STS RISK CALCULATOR: AVR/CABG:  Procedure: AVR + CAB  Risk of Mortality:  3.932%  Renal Failure:  2.742%  Permanent Stroke:  2.494%  Prolonged Ventilation:  11.115%  DSW Infection:  0.293%  Reoperation:  8.024%  Morbidity or Mortality:  21.483%  Short Length of Stay:  20.586%  Long Length of Stay:  13.072%   ASSESSMENT AND PLAN: 34.  80 year old male with New York Heart Association functional class III symptoms of exertional dyspnea and chest discomfort.  The patient has severe, stage D1 aortic stenosis as well as multivessel coronary artery disease.  Comorbid conditions include infrarenal abdominal aortic aneurysm and chronic lung disease with  longstanding tobacco abuse.  I have reviewed the natural history of aortic stenosis with the patient today. We have discussed the limitations of medical therapy and the poor prognosis associated with symptomatic aortic stenosis. We have reviewed potential treatment options, including palliative medical therapy, conventional surgical aortic valve replacement, and transcatheter aortic valve replacement. We discussed treatment options in the context of the patient's specific comorbid medical conditions.  I personally reviewed his echo images which demonstrate severe calcification and restriction of a trileaflet aortic valve with mean transvalvular gradient of 50 mmHg and calculated aortic valve area of 0.96 cm.  LV systolic function is preserved with an ejection fraction of 60 to 65%.  Cardiac catheterization films demonstrate chronic total occlusion of the RCA with extensive left-to-right collaterals.  The LAD has a very severe calcific proximal vessel stenosis of 95%.  The circumflex has moderately severe stenosis noted with estimated at 70%.  CTA studies demonstrate suitable anatomy for TAVR with a 29 mm transcatheter heart valve.  The patient has a 5 cm infrarenal abdominal aortic aneurysm and may ultimately require treatment.  He has pending evaluation with vascular surgery.  Because of his aneurysm, we would likely proceed with alternative access using a left subclavian approach.  Prior to TAVR, the patient will require PCI for treatment of his severe multivessel coronary artery disease.  He is not felt to be a suitable candidate for CABG/AVR because of his multiple comorbidities.  I think his LAD is treatable with atherectomy and stenting.  I will plan to do pressure wire analysis of his left circumflex and treat that lesion as indicated.  The occluded RCA will be treated medically.  I have reviewed the risks, indications, and alternatives to coronary atherectomy, angioplasty, and stenting with this patient.   He understands the risks of major complications such as stroke, myocardial infarction, serious arrhythmia, major vascular injury, coronary perforation, cardiac tamponade, emergency cardiac surgery, and death, are approximately 1%.  The patient provides full informed consent and agrees to proceed. He will be started on clopidogrel in anticipation of PCI.    Deatra James 04/20/2020 11:51 AM     [facility] [address] [place] [address]  [phone] (office) [phone] (fax)

## 2020-04-20 NOTE — Patient Instructions (Addendum)
Medication Instructions:  1) STOP OMEPRAZOLE 2) START PLAVIX 75 mg daily  3) START PANTOPRAZOLE 40 mg daily *If you need a refill on your cardiac medications before your next appointment, please call your pharmacy*   COVID SCREENING INFORMATION (04/25/20): You are scheduled for your drive-thru COVID screening on 04/25/20 between 10AM and 11AM. Pre-Procedural COVID-19 Testing Site [address] [place], [place] [address] You will need to go home after your screening and quarantine until your procedure.   CATHETERIZATION INFORMATION (04/27/20): You are scheduled for a Cardiac Catheterization on Wednesday, March 23 with Dr. Sherren Mocha.  1. Please arrive at the [facility] (Main Entrance A) at [facility]: [address] [place], [place] [address] at 8:00 AM (This time is two hours before your procedure to ensure your preparation). Free valet parking service is available. You are allowed ONE visitor on the waiting room during your procedure. Both you and your guest must wear masks. Special note: Every effort is made to have your procedure done on time. Please understand that emergencies sometimes delay scheduled procedures.  2. Diet: Do not eat solid foods after midnight.  You may have clear liquids until 5am upon the day of the procedure.  3. Labs: TODAY! BMET, CBC  4. Medication instructions in preparation for your procedure:  1) MAKE SURE TO TAKE ASPIRIN AND PLAVIX the morning of your cath  2) You may take your other medications as directed with sips of water  5. Plan for one night stay--bring personal belongings. 6. Bring a current list of your medications and current insurance cards. 7. You MUST have a responsible person to drive you home. 8. Someone MUST be with you the first 24 hours after you arrive home or your discharge will be delayed. 9. Please wear clothes that are easy to get on and off and wear slip-on shoes.  Thank you for allowing [place] to care for you!   --  [facility]

## 2020-04-20 NOTE — H&P (View-Only) (Signed)
[facility]   MULTIDISCIPLINARY HEART VALVE TEAM  Date:  04/20/2020   ID:  Brandon [name], DOB Oct 31, 1940, MRN [id number]  PCP:  Haydee Salter, MD   Chief Complaint  Patient presents with  . Coronary Artery Disease  . Aortic Stenosis     HISTORY OF PRESENT ILLNESS: Brandon [name] is a 80 y.o. male who presents for evaluation of severe aortic stenosis, referred by Dr Aundra Dubin.   The patient has a history of coronary artery disease, hypertension, and mixed hyperlipidemia.  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  The patient has been followed for aortic stenosis with surveillance echo studies and close clinical care by Dr. Algernon Huxley.  He has developed progressive and now severe aortic stenosis with mean gradient of 50 mmHg and calculated valve area of 0.96 cm.  LVEF remains preserved at 60 to 65%.  He underwent cardiac catheterization for preoperative assessment and was demonstrated to have chronic occlusion of the right coronary artery with left-to-right collaterals, severe calcific proximal LAD stenosis, and moderately severe long segment mid circumflex stenosis.  He has undergone formal cardiac surgical evaluation by Dr. Cyndia Bent, and presents today for further multidisciplinary assessment.  The patient is here alone today. He is still trying hard to quit smoking, now using a nicotine patch. He reports having a heart murmur for over 40 years. The patient worked in [employer] business most of his life. Over the past few years he has had exertional dyspnea and chest discomfort. It's hard for him to characterize, but he thinks his symptoms are worse over the past year. No orthopnea, PND, or leg swelling.  Chest pain is described as an uncomfortable feeling across the substernal region.  The patient has had regular  dental care and reports full dentures.  Past Medical History:  Diagnosis Date  . Aortic stenosis    mild echocardiogram 8/09 EF 60%, showed no regional wall motion abnml, mild LVH, mod focal basal septal hypertrophy and mild dyastolic dysfunction. 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 mean gradient across aortic valve was 8 mmHG. also mild L atrial enlargement and normal RV size and function. minimal AS on LHC in 7/10.   . Arthritis    "all over" (07/19/2016)  . BPH (benign prostatic hypertrophy)   . CAD (coronary artery disease)    [facility] (7/10) totally occluded proximal RCA with very robuse L to R collaterals, 50% proximal LAD stenosis, EF 65%, medical management.    . CHF (congestive heart failure) ([place])   . Chicken pox   . Chronic lower back pain    s/p surgical fusion  . Diverticulosis   . Esophageal stricture   . GERD (gastroesophageal reflux disease)   . Heart murmur   . Hepatitis B 1984  . Hiatal hernia   . History of radiation therapy 01/16/18- 03/05/18   Left Tonsil, 66 Gy in 33 fractions to high risk nodal echelons.   Marland Kitchen HLD (hyperlipidemia)   . HTN (hypertension)   . 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 Osteoarthritis   . TIA (transient ischemic attack) 1990s   hx  . tonsillar ca dx'd 11/2017  . Tubular adenoma of colon 2009    Current Outpatient Medications  Medication Sig Dispense Refill  . amLODipine (NORVASC) 10 MG tablet Take 1 tablet (10 mg total) by mouth daily. 90 tablet  3  . aspirin 81 MG EC tablet Take 81 mg by mouth at bedtime.    Marland Kitchen atorvastatin (LIPITOR) 80 MG tablet Take 1 tablet (80 mg total) by mouth daily. 90 tablet 3  . carvedilol (COREG) 6.25 MG tablet Take 1 tablet (6.25 mg total) by mouth 2 (two) times daily. 60 tablet 11  . citalopram (CELEXA) 20 MG tablet Take 1 tablet (20 mg total) by mouth daily. 30 tablet 5  . fluticasone (FLONASE) 50 MCG/ACT nasal spray Place 2 sprays into both nostrils daily as needed for  rhinitis or allergies.    Marland Kitchen gabapentin (NEURONTIN) 300 MG capsule TAKE 1 CAPSULE(300 MG) BY MOUTH THREE TIMES DAILY 90 capsule 5  . isosorbide mononitrate (IMDUR) 30 MG 24 hr tablet TAKE 1 TABLET(30 MG) BY MOUTH AT BEDTIME 90 tablet 1  . lisinopril (ZESTRIL) 10 MG tablet TAKE 1 TABLET(10 MG) BY MOUTH DAILY 90 tablet 3  . nicotine (NICODERM CQ - DOSED IN MG/24 HOURS) 21 mg/24hr patch Place 21 mg onto the skin daily.    . Nutritional Supplements (PROTEIN SUPPLEMENT 80% PO) Take 11 oz by mouth daily. Premier Protein chocolate    . omeprazole (PRILOSEC) 20 MG capsule TAKE 1 CAPSULE(20 MG) BY MOUTH DAILY 90 capsule 2  . oxyCODONE (OXY IR/ROXICODONE) 5 MG immediate release tablet Take 1-2 tablets (5-10 mg total) by mouth every 6 (six) hours as needed for severe pain (to take 2 tabs ONLY when severe pain- which is not usual for pt). 120 tablet 0  . predniSONE (DELTASONE) 1 MG tablet Take 1 tablet by mouth daily with the 5 mg tablet (total 6 mg daily). 30 tablet 0  . predniSONE (DELTASONE) 5 MG tablet Take 1 tablet (5 mg total) by mouth daily with breakfast. 30 tablet 0  . tamsulosin (FLOMAX) 0.4 MG CAPS capsule Take 0.8 mg by mouth daily.    . traMADol (ULTRAM) 50 MG tablet Take 2 tablets (100 mg total) by mouth 2 (two) times daily. 120 tablet 5   No current facility-administered medications for this visit.    ALLERGIES:   Celebrex [celecoxib]   SOCIAL HISTORY:  The patient  reports that he quit smoking about 2 years ago. His smoking use included cigarettes. He has a 75.00 pack-year smoking history. He has never used smokeless tobacco. He reports current alcohol use. He reports that he does not use drugs.   FAMILY HISTORY:  The patient's family history includes Alzheimer's disease (age of onset: 5) in his mother; Aneurysm in his brother; Arthritis in his mother; Brain cancer in his maternal aunt; Coronary artery disease in his father; Heart disease (age of onset: 81) in his father; Obesity in his  daughter; Stomach cancer in his maternal uncle.   REVIEW OF SYSTEMS:  Positive for arthritis, muscle aches, anxiety, hearing loss, easy bruising.   All other systems are reviewed and negative.   PHYSICAL EXAM: VS:  BP 120/72   Pulse 63   Ht 5' 7.5" (1.715 m)   Wt 173 lb (78.5 kg)   SpO2 95%   BMI 26.70 kg/m  , BMI Body mass index is 26.7 kg/m. GEN: Well nourished, well developed, pleasant elderly male in no acute distress HEENT: normal Neck: No JVD. carotids 2+ with bilateral bruits Cardiac: The heart is RRR with 3/6 harsh late peaking systolic murmur at the right upper sternal border. No edema. Pedal pulses 2+ = bilaterally  Respiratory: Scattered rhonchi bilaterally GI: soft, nontender, nondistended, + BS MS: no deformity or atrophy  Skin: warm and dry, no rash Neuro:  Strength and sensation are intact Psych: euthymic mood, full affect  EKG:  EKG from today reviewed and demonstrates normal sinus rhythm 63 bpm, LVH with repolarization abnormality  RECENT LABS: 01/11/2020: ALT 6 03/24/2020: BUN 14; Creatinine, Ser 0.68; Platelets 159 03/25/2020: TSH 1.569 03/31/2020: Hemoglobin 11.6; Hemoglobin 11.6; Potassium 3.2; Potassium 3.2; Sodium 142; Sodium 140  03/24/2020: Cholesterol 166; HDL 53; LDL Cholesterol 98; Total CHOL/HDL Ratio 3.1; Triglycerides 76; VLDL 15   CrCl cannot be calculated (Patient's most recent lab result is older than the maximum 21 days allowed.).   Wt Readings from Last 3 Encounters:  04/20/20 173 lb (78.5 kg)  04/19/20 173 lb (78.5 kg)  03/31/20 173 lb 1 oz (78.5 kg)     CARDIAC STUDIES:  Echo:  IMPRESSIONS    1. Left ventricular ejection fraction, by estimation, is 60 to 65%. The  left ventricle has normal function. The left ventricle has no regional  wall motion abnormalities. There is mild left ventricular hypertrophy.  Left ventricular diastolic parameters  are consistent with Grade II diastolic dysfunction (pseudonormalization).  2. Right  ventricular systolic function is normal. The right ventricular  size is normal. There is mildly elevated pulmonary artery systolic  pressure. The estimated right ventricular systolic pressure is 44.0 mmHg.  3. Left atrial size was moderately dilated.  4. The mitral valve is degenerative. No evidence of mitral valve  regurgitation. No evidence of mitral stenosis.  5. The aortic valve is tricuspid. Aortic valve regurgitation is mild.  Severe aortic valve stenosis. Aortic valve area, by VTI measures 0.96 cm.  Aortic valve mean gradient measures 50.0 mmHg.  6. Aortic dilatation noted. There is mild dilatation of the ascending  aorta, measuring 42 mm.  7. The inferior vena cava is normal in size with greater than 50%  respiratory variability, suggesting right atrial pressure of 3 mmHg.   FINDINGS  Left Ventricle: Left ventricular ejection fraction, by estimation, is 60  to 65%. The left ventricle has normal function. The left ventricle has no  regional wall motion abnormalities. The left ventricular internal cavity  size was normal in size. There is  mild left ventricular hypertrophy. Left ventricular diastolic parameters  are consistent with Grade II diastolic dysfunction (pseudonormalization).   Right Ventricle: The right ventricular size is normal. No increase in  right ventricular wall thickness. Right ventricular systolic function is  normal. There is mildly elevated pulmonary artery systolic pressure. The  tricuspid regurgitant velocity is 3.22  m/s, and with an assumed right atrial pressure of 3 mmHg, the estimated  right ventricular systolic pressure is 34.7 mmHg.   Left Atrium: Left atrial size was moderately dilated.   Right Atrium: Right atrial size was normal in size.   Pericardium: There is no evidence of pericardial effusion.   Mitral Valve: The mitral valve is degenerative in appearance. Mild to  moderate mitral annular calcification. No evidence of mitral valve   regurgitation. No evidence of mitral valve stenosis.   Tricuspid Valve: The tricuspid valve is normal in structure. Tricuspid  valve regurgitation is trivial.   Aortic Valve: The aortic valve is tricuspid. Aortic valve regurgitation is  mild. Aortic regurgitation PHT measures 658 msec. Severe aortic stenosis  is present. Aortic valve mean gradient measures 50.0 mmHg. Aortic valve  peak gradient measures 70.4 mmHg.  Aortic valve area, by VTI measures 0.96 cm.   Pulmonic Valve: The pulmonic valve was normal in structure. Pulmonic valve  regurgitation is not  visualized.   Aorta: The aortic root is normal in size and structure and aortic  dilatation noted. There is mild dilatation of the ascending aorta,  measuring 42 mm.   Venous: The inferior vena cava is normal in size with greater than 50%  respiratory variability, suggesting right atrial pressure of 3 mmHg.   IAS/Shunts: No atrial level shunt detected by color flow Doppler.     LEFT VENTRICLE  PLAX 2D  LVIDd:     5.50 cm Diastology  LVIDs:     4.10 cm LV e' medial:  5.87 cm/s  LV PW:     1.00 cm LV E/e' medial: 15.0  LV IVS:    1.10 cm LV e' lateral:  8.49 cm/s  LVOT diam:   2.00 cm LV E/e' lateral: 10.3  LV SV:     98  LV SV Index:  51  LVOT Area:   3.14 cm     RIGHT VENTRICLE  RV S prime:   12.20 cm/s  TAPSE (M-mode): 2.4 cm   LEFT ATRIUM       Index    RIGHT ATRIUM      Index  LA diam:    4.30 cm 2.24 cm/m RA Area:   11.60 cm  LA Vol (A2C):  88.5 ml 46.17 ml/m RA Volume:  18.60 ml 9.70 ml/m  LA Vol (A4C):  94.3 ml 49.20 ml/m  LA Biplane Vol: 97.0 ml 50.60 ml/m  AORTIC VALVE  AV Area (Vmax):  0.92 cm  AV Area (Vmean):  0.95 cm  AV Area (VTI):   0.96 cm  AV Vmax:      419.40 cm/s  AV Vmean:     319.400 cm/s  AV VTI:      1.029 m  AV Peak Grad:   70.4 mmHg  AV Mean Grad:   50.0 mmHg  LVOT Vmax:     123.00  cm/s  LVOT Vmean:    96.600 cm/s  LVOT VTI:     0.313 m  LVOT/AV VTI ratio: 0.30  AI PHT:      658 msec    AORTA  Ao Root diam: 3.50 cm  Ao Asc diam: 4.05 cm   MITRAL VALVE        TRICUSPID VALVE  MV Area (PHT): 3.53 cm  TR Peak grad:  41.5 mmHg  MV Decel Time: 215 msec  TR Vmax:    322.00 cm/s  MV E velocity: 87.80 cm/s  MV A velocity: 70.70 cm/s SHUNTS  MV E/A ratio: 1.24    Systemic VTI: 0.31 m               Systemic Diam: 2.00 cm    Cardiac Cath:  Conclusion  1. Low filling pressures.  2. Preserved cardiac output.  3. Known occluded RCA with robust collaterals from LAD.  4. 95% calcified proximal LAD stenosis (LAD provides RCA collaterals).  5. 60-70% proximal LCx stenosis.   Patient has known severe AS (from echo).  I reviewed films with Drs Angelena Form and Burt Knack.  PCI-TAVR would be an option, would likely need atherectomy to address LAD and then PCI LCx.  SAVR-CABG would also be an option, and may be safer in the long term as any compromise to an LAD stent would affect both LAD and RCA territory. Patient will be seen by cardiac surgeon and get scans for TAVR planning.    Diagnostic Dominance: Right  Left Main  20-30% distal left main narrowing.  Left Anterior Descending  95% calcified proximal LAD stenosis. The LAD provides robust collaterals to the RCA. Luminal irregularities throughout the rest of the LAD.  Ramus Intermedius  60-70% stenosis proximal LCx.  Right Coronary Artery  Totally occluded proximal RCA (known from past cath). There are robust collaterals from the LAD to the RCA territory.     Right Heart Pressures RHC Procedural Findings: Hemodynamics (mmHg) RA mean 2 RV 23/1 PA 28/1 PCWP mean 5 AO 121/52  Oxygen saturations: PA 74% AO 100%  Cardiac Output (Fick) 5.88  Cardiac Index (Fick) 3.08    CTA Heart: FINDINGS: Aortic Valve: Tri-leaflet aortic valve with severely calcified  and thickened leaflets, severely restricted leaflet opening and asymmetric calcifications extending into the LVOT under the left coronary cusp.  Aorta: Normal size, moderate diffuse atherosclerotic plaque with calcifications.  Sinotubular Junction: 32 x 32 mm  Ascending Thoracic Aorta: 38 x 37 mm  Aortic Arch: 33 x 31 mm  Descending Thoracic Aorta: 27 x 25 mm  Sinus of Valsalva Measurements:  Non-coronary: 38 mm  Right -coronary: 38 mm  Left -coronary:  38 mm  Coronary Artery Height above Annulus:  Left Main: 16 mm  Right Coronary: 21 mm  Virtual Basal Annulus Measurements:  Maximum/Minimum Diameter: 29.8 x 24.4 mm  Mean Diameter: 26.9 mm  Perimeter: 86.6 mm  Area: 569 mm2  Optimum Fluoroscopic Angle for Delivery: LAO 5 CAU 4  IMPRESSION: 1. Tri-leaflet aortic valve with severely calcified and thickened leaflets, severely restricted leaflet opening and asymmetric calcifications extending into the LVOT under the left coronary cusp. Aortic valve calcium score is 5106 consistent with severe aortic stenosis. Annular measurements suitable for delivery of a 29 mm Edwards-SAPIEN 3 Ultra valve.  2. Sufficient coronary to annulus distance.  3. Optimum Fluoroscopic Angle for Delivery: LAO 5 CAU 4.  4. No thrombus in the left atrial appendage.  CTA Chest/Abd/Pelvis: AORTA:  Minimal Aortic Diameter-11 x 9 mm  Severity of Aortic Calcification-severe  RIGHT PELVIS:  Right Common Iliac Artery -  Minimal Diameter-7.2 x 5.3 mm  Tortuosity-severe  Calcification-moderate to severe  Right External Iliac Artery -  Minimal Diameter-6.5 x 6.5 mm  Tortuosity-moderate  Calcification-moderate  Right Common Femoral Artery -  Minimal Diameter-6.4 x 7.3 mm  Tortuosity-mild  Calcification-moderate to severe  LEFT PELVIS:  Left Common Iliac Artery -  Minimal Diameter-7.2 x 5.9  mm  Tortuosity-severe  Calcification-moderate to severe  Left External Iliac Artery -  Minimal Diameter-6.8 x 7.0 mm  Tortuosity-moderate  Calcification-mild  Left Common Femoral Artery -  Minimal Diameter-8.6 x 8.4 mm  Tortuosity-mild  Calcification-moderate  Review of the MIP images confirms the above findings.  IMPRESSION: 1. Vascular findings and measurements pertinent to potential TAVR procedure, as detailed above. 2. Severe thickening calcification of the aortic valve, compatible with reported clinical history of severe aortic stenosis. 3. Aortic atherosclerosis, in addition to left main and 3 vessel coronary artery disease. There is also a large infrarenal abdominal aortic aneurysm which is increasing in size, currently measuring 5.0 x 4.2 cm in diameter (previously 4.3 cm on 02/26/2017). Recommend referral to a vascular specialist. This recommendation follows ACR consensus guidelines: White Paper of the ACR Incidental Findings Committee II on Vascular Findings. J Am Coll Radiol 2013; 10:789-794. 4. Morphologic changes in the liver indicative of underlying cirrhosis. 5. Colonic diverticulosis without evidence of acute diverticulitis at this time. 6. Additional incidental findings, as above.  STS RISK CALCULATOR: AVR/CABG:  Procedure: AVR + CAB  Risk of Mortality:  3.932%  Renal Failure:  2.742%  Permanent Stroke:  2.494%  Prolonged Ventilation:  11.115%  DSW Infection:  0.293%  Reoperation:  8.024%  Morbidity or Mortality:  21.483%  Short Length of Stay:  20.586%  Long Length of Stay:  13.072%   ASSESSMENT AND PLAN: 70.  80 year old male with New York Heart Association functional class III symptoms of exertional dyspnea and chest discomfort.  The patient has severe, stage D1 aortic stenosis as well as multivessel coronary artery disease.  Comorbid conditions include infrarenal abdominal aortic aneurysm and chronic lung disease with  longstanding tobacco abuse.  I have reviewed the natural history of aortic stenosis with the patient today. We have discussed the limitations of medical therapy and the poor prognosis associated with symptomatic aortic stenosis. We have reviewed potential treatment options, including palliative medical therapy, conventional surgical aortic valve replacement, and transcatheter aortic valve replacement. We discussed treatment options in the context of the patient's specific comorbid medical conditions.  I personally reviewed his echo images which demonstrate severe calcification and restriction of a trileaflet aortic valve with mean transvalvular gradient of 50 mmHg and calculated aortic valve area of 0.96 cm.  LV systolic function is preserved with an ejection fraction of 60 to 65%.  Cardiac catheterization films demonstrate chronic total occlusion of the RCA with extensive left-to-right collaterals.  The LAD has a very severe calcific proximal vessel stenosis of 95%.  The circumflex has moderately severe stenosis noted with estimated at 70%.  CTA studies demonstrate suitable anatomy for TAVR with a 29 mm transcatheter heart valve.  The patient has a 5 cm infrarenal abdominal aortic aneurysm and may ultimately require treatment.  He has pending evaluation with vascular surgery.  Because of his aneurysm, we would likely proceed with alternative access using a left subclavian approach.  Prior to TAVR, the patient will require PCI for treatment of his severe multivessel coronary artery disease.  He is not felt to be a suitable candidate for CABG/AVR because of his multiple comorbidities.  I think his LAD is treatable with atherectomy and stenting.  I will plan to do pressure wire analysis of his left circumflex and treat that lesion as indicated.  The occluded RCA will be treated medically.  I have reviewed the risks, indications, and alternatives to coronary atherectomy, angioplasty, and stenting with this patient.   He understands the risks of major complications such as stroke, myocardial infarction, serious arrhythmia, major vascular injury, coronary perforation, cardiac tamponade, emergency cardiac surgery, and death, are approximately 1%.  The patient provides full informed consent and agrees to proceed. He will be started on clopidogrel in anticipation of PCI.    Deatra James 04/20/2020 11:51 AM     [facility] [address] [place] [address]  [phone] (office) [phone] (fax)

## 2020-04-21 LAB — BASIC METABOLIC PANEL
BUN/Creatinine Ratio: 28 — ABNORMAL HIGH (ref 10–24)
BUN: 18 mg/dL (ref 8–27)
CO2: 25 mmol/L (ref 20–29)
Calcium: 9 mg/dL (ref 8.6–10.2)
Chloride: 100 mmol/L (ref 96–106)
Creatinine, Ser: 0.65 mg/dL — ABNORMAL LOW (ref 0.76–1.27)
Glucose: 128 mg/dL — ABNORMAL HIGH (ref 65–99)
Potassium: 3.9 mmol/L (ref 3.5–5.2)
Sodium: 143 mmol/L (ref 134–144)
eGFR: 97 mL/min/{1.73_m2} (ref >59)

## 2020-04-21 LAB — CBC WITH DIFFERENTIAL/PLATELET
Basophils Absolute: 0 10*3/uL (ref 0.0–0.2)
Basos: 0 %
EOS (ABSOLUTE): 0.3 10*3/uL (ref 0.0–0.4)
Eos: 3 %
Hematocrit: 37.4 % — ABNORMAL LOW (ref 37.5–51.0)
Hemoglobin: 11.7 g/dL — ABNORMAL LOW (ref 13.0–17.7)
Immature Grans (Abs): 0 10*3/uL (ref 0.0–0.1)
Immature Granulocytes: 0 %
Lymphocytes Absolute: 0.6 10*3/uL — ABNORMAL LOW (ref 0.7–3.1)
Lymphs: 7 %
MCH: 25.3 pg — ABNORMAL LOW (ref 26.6–33.0)
MCHC: 31.3 g/dL — ABNORMAL LOW (ref 31.5–35.7)
MCV: 81 fL (ref 79–97)
Monocytes Absolute: 0.4 10*3/uL (ref 0.1–0.9)
Monocytes: 5 %
Neutrophils Absolute: 7.5 10*3/uL — ABNORMAL HIGH (ref 1.4–7.0)
Neutrophils: 85 %
Platelets: 158 10*3/uL (ref 150–450)
RBC: 4.62 x10E6/uL (ref 4.14–5.80)
RDW: 14.2 % (ref 11.6–15.4)
WBC: 8.9 10*3/uL (ref 3.4–10.8)

## 2020-04-25 ENCOUNTER — Encounter: Payer: Self-pay | Admitting: Physical Medicine and Rehabilitation

## 2020-04-25 ENCOUNTER — Other Ambulatory Visit: Payer: Self-pay

## 2020-04-25 ENCOUNTER — Other Ambulatory Visit (HOSPITAL_COMMUNITY)
Admission: RE | Admit: 2020-04-25 | Discharge: 2020-04-25 | Disposition: A | Discharge status: Home / Self Care | Payer: Medicare Other | Source: Ambulatory Visit | Attending: Cardiovascular Disease | Admitting: Cardiovascular Disease

## 2020-04-25 ENCOUNTER — Other Ambulatory Visit (HOSPITAL_COMMUNITY): Payer: Medicare Other

## 2020-04-25 ENCOUNTER — Encounter
Payer: Medicare Other | Attending: Physical Medicine and Rehabilitation | Admitting: Physical Medicine and Rehabilitation

## 2020-04-25 VITALS — BP 151/62 | HR 70 | Temp 98.7°F | Ht 67.5 in | Wt 172.0 lb

## 2020-04-25 DIAGNOSIS — F329 Major depressive disorder, single episode, unspecified: Secondary | ICD-10-CM | POA: Diagnosis not present

## 2020-04-25 DIAGNOSIS — M353 Polymyalgia rheumatica: Secondary | ICD-10-CM

## 2020-04-25 DIAGNOSIS — Z20822 Contact with and (suspected) exposure to covid-19: Secondary | ICD-10-CM | POA: Insufficient documentation

## 2020-04-25 DIAGNOSIS — Z79891 Long term (current) use of opiate analgesic: Secondary | ICD-10-CM

## 2020-04-25 DIAGNOSIS — Z5181 Encounter for therapeutic drug level monitoring: Secondary | ICD-10-CM | POA: Diagnosis not present

## 2020-04-25 DIAGNOSIS — G894 Chronic pain syndrome: Secondary | ICD-10-CM | POA: Diagnosis not present

## 2020-04-25 DIAGNOSIS — Z01812 Encounter for preprocedural laboratory examination: Secondary | ICD-10-CM | POA: Insufficient documentation

## 2020-04-25 MED ORDER — CITALOPRAM HYDROBROMIDE 20 MG PO TABS: 20.0000 mg | ORAL_TABLET | Freq: Every day | ORAL | 5 refills | Status: DC

## 2020-04-25 NOTE — Patient Instructions (Addendum)
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   1. Chronic pain- has reduced Oxycodone some- some days 3 no more than 4 tabs/day. Has enough for 4 tabs/day in Rx- will con't 120 tabs/month- is usually pretty good with this. Don't take for Polymalgia rheumatica Sx's- just refilled   2. Restless leg syndrome- con't Tramadol 100 mg BID and Gabapentin- just refilled  3. Valve surgery- not doing open heart surgery- doing from L upper chest and stent via R wrist-   4. Con't Citalopram for mood/anxiety- is working- will con't current dose.   5. Down to 6 mg daily for prednisone- is hurting more in shoulders and hands are worse- cannot grip pen.  So sore and stiff. Last reduced ~ 1 month ago. Pt taking Oxycodone to compensate for stiffness/PMR.    6. Just got refills- will get Urine drug screen today- in the next 24 hours- is due. .   7 F/U in 3 months- call earlier if need be- .

## 2020-04-25 NOTE — Progress Notes (Signed)
Subjective:    Patient ID: SENDER RUEB, male    DOB: Jun 12, 1940, 80 y.o.   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   Here for f/u on chronic pain syndrome.   Wednesday having 2 stents put in heart-  4-5 weeks from now- having a valve replaced at that time-after the wedding.   Gives daughter away 05/21/20. 2nd marriage No Bypass at this time.  Dr Burt Knack and Eliot Ford doing those.  Dr Donzetta Matters- to fix AAA- 1-3 months after that.   Breathing acting up- more SOB- getting tired quicker.  Admits he's wheezing.   Had started smoking again- on nicotine patch again- due to anxiety/nerves. Doing better.  Due to radiation on neck that he had prior.  Coughs up white thick phlegm- cannot spit due to tongue partial glossectomy.    Pain is better- most days fair to good- the more exercise he gets, the better he feels.  At night, back pain flares up.   Taking Oxycodone 3-4 pills/day- occ at midnight as well.  Pain down legs so bad, has to keep reposition- sometimes.   Tramadol- is helping the restless leg syndrome- only 2 in Am and 2 at night.    Mood better with Celexa- not arguing with wife- per pt- and overall calmer and less depressed.   Pain Inventory Average Pain 4 Pain Right Now 5 My pain is sharp and aching  In the last 24 hours, has pain interfered with the following? General activity 4 Relation with others 0 Enjoyment of life 4 What TIME of day is your pain at its worst? morning  and daytime Sleep (in general) Fair  Pain is worse with: walking, standing and some activites Pain improves with: rest and medication Relief from Meds: 8  Family History  Problem  Relation Age of Onset  . Heart disease Father 88       Living  . Coronary artery disease Father        CABG  . Alzheimer's disease Mother 67       Deceased  . Arthritis Mother   . Aneurysm Brother   . Stomach cancer Maternal Uncle   . Brain cancer Maternal Aunt        x2  . Obesity Daughter        Had Bypass Sx   Social History   Socioeconomic History  . Marital status: Married    Spouse name: etta  . Number of children: 2  . Years of education: Not on file  . Highest education level: Not on file  Occupational History  . Occupation: retired    Fish farm manager: RETIRED    Comment: disabled due to back problems  Tobacco Use  . Smoking status: Former Smoker    Packs/day: 1.50    Years: 50.00    Pack years: 75.00    Types: Cigarettes    Quit date: 09/05/2017    Years since quitting: 2.6  . Smokeless tobacco: Never Used  . Tobacco comment: smoked less than 1 ppd for 40+ years; Using Vapor Cig  Vaping Use  . Vaping Use: Never used  Substance  and Sexual Activity  . Alcohol use: Yes    Alcohol/week: 0.0 standard drinks    Comment: 07/19/2016 "might have a few drinks/year"  . Drug use: No  . Sexual activity: Never  Other Topics Concern  . Not on file  Social History Narrative   ** Merged History Encounter **       Married (3rd), [place]. 2 children from 1st marriage, 4 step children.    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       Cell # [phone]   Social Determinants of Health   Financial Resource Strain: Low Risk   . Difficulty of Paying Living Expenses: Not hard at all  Food Insecurity: No Food Insecurity  . Worried About Charity fundraiser in the Last Year: Never true  . Ran Out of Food in the Last Year: Never true  Transportation Needs: No Transportation Needs  . Lack of Transportation (Medical): No  . Lack of Transportation (Non-Medical): No  Physical Activity: Inactive  . Days  of Exercise per Week: 0 days  . Minutes of Exercise per Session: 0 min  Stress: No Stress Concern Present  . Feeling of Stress : Not at all  Social Connections: Moderately Isolated  . Frequency of Communication with Friends and Family: More than three times a week  . Frequency of Social Gatherings with Friends and Family: Once a week  . Attends Religious Services: Never  . Active Member of Clubs or Organizations: No  . Attends Archivist Meetings: Never  . Marital Status: Married   Past Surgical History:  Procedure Laterality Date  . BACK SURGERY    . CATARACT EXTRACTION W/ INTRAOCULAR LENS  IMPLANT, BILATERAL Bilateral 01/2012 - 02/2012  . DIRECT LARYNGOSCOPY Left 10/09/2017   Procedure: DIRECT LARYNGOSCOPY WITH BOPSY;  Surgeon: Jodi Marble, MD;  Location: [place];  Service: ENT;  Laterality: Left;  . ESOPHAGOGASTRODUODENOSCOPY (EGD) WITH ESOPHAGEAL DILATION     "couple times" (07/19/2016)  . ESOPHAGOSCOPY Left 10/09/2017   Procedure: ESOPHAGOSCOPY;  Surgeon: Jodi Marble, MD;  Location: [place];  Service: ENT;  Laterality: Left;  . IR GASTROSTOMY TUBE MOD SED  01/08/2018  . IR THORACENTESIS ASP PLEURAL SPACE W/IMG GUIDE  07/19/2016  . LAPAROSCOPIC CHOLECYSTECTOMY  1994  . LUMBAR DISC SURGERY  05/1996   L4-5; Dr. Sherwood Gambler   . LUMBAR LAMINECTOMY/DECOMPRESSION MICRODISCECTOMY  10/2002   L3-4. Dr. Sherwood Gambler  . MULTIPLE TOOTH EXTRACTIONS  1980s  . PARTIAL GLOSSECTOMY  12/02/2017   Dr. Nicolette Bang- [facility]  . pharyngoplasty for closure of tingue base defect  12/02/2017   Dr. Nicolette [facility]- [facility]  . PICC LINE INSERTION  07/15/2016  . POSTERIOR LUMBAR FUSION  09/1996   Ray cage, L4-5 Dr. Rita Ohara  . PROSTATE BIOPSY  ~ 2017  . radical tonsillectomy Left 12/02/2017   Dr. Nicolette [facility] at [facility]  . RIGHT HEART CATH AND CORONARY ANGIOGRAPHY N/A 03/31/2020   Procedure: RIGHT HEART CATH AND CORONARY ANGIOGRAPHY;  Surgeon: Larey Dresser, MD;  Location: [place]  CV LAB;  Service: Cardiovascular;  Laterality: N/A;  . RIGID BRONCHOSCOPY Left 10/09/2017   Procedure: RIGID BRONCHOSCOPY;  Surgeon: Jodi Marble, MD;  Location: [place];  Service: ENT;  Laterality: Left;  . TR[facility]OST[facility]  [facility]02/2017   [facility]   Past Surgical History:  Procedure Laterality Date  . BACK SURGERY    .  CATARACT EXTRACTION W/ INTRAOCULAR LENS  IMPLANT, BILATERAL Bilateral 01/2012 - 02/2012  . DIRECT LARYNGOSCOPY Left 10/09/2017   Procedure: DIRECT LARYNGOSCOPY WITH BOPSY;  Surgeon: Jodi Marble, MD;  Location: [place];  Service: ENT;  Laterality: Left;  . ESOPHAGOGASTRODUODENOSCOPY (EGD) WITH ESOPHAGEAL DILATION     "couple times" (07/19/2016)  . ESOPHAGOSCOPY Left 10/09/2017   Procedure: ESOPHAGOSCOPY;  Surgeon: Jodi Marble, MD;  Location: [place];  Service: ENT;  Laterality: Left;  . IR GASTROSTOMY TUBE MOD SED  01/08/2018  . IR THORACENTESIS ASP PLEURAL SPACE W/IMG GUIDE  07/19/2016  . LAPAROSCOPIC CHOLECYSTECTOMY  1994  . LUMBAR DISC SURGERY  05/1996   L4-5; Dr. Sherwood Gambler   . LUMBAR LAMINECTOMY/DECOMPRESSION MICRODISCECTOMY  10/2002   L3-4. Dr. Sherwood Gambler  . MULTIPLE TOOTH EXTRACTIONS  1980s  . PARTIAL GLOSSECTOMY  12/02/2017   Dr. Nicolette Bang- [facility]  . pharyngoplasty for closure of tingue base defect  12/02/2017   Dr. Nicolette Bang- [facility]  . PICC LINE INSERTION  07/15/2016  . POSTERIOR LUMBAR FUSION  09/1996   Ray cage, L4-5 Dr. Rita Ohara  . PROSTATE BIOPSY  ~ 2017  . radical tonsillectomy Left 12/02/2017   Dr. Nicolette Bang at [facility]  . RIGHT HEART CATH AND CORONARY ANGIOGRAPHY N/A 03/31/2020   Procedure: RIGHT HEART CATH AND CORONARY ANGIOGRAPHY;  Surgeon: Larey Dresser, MD;  Location: [place] CV LAB;  Service: Cardiovascular;  Laterality: N/A;  . RIGID BRONCHOSCOPY Left 10/09/2017   Procedure: RIGID BRONCHOSCOPY;  Surgeon: Jodi Marble, MD;  Location: [place];  Service: ENT;  Laterality: Left;  .  [facility]OST[facility]  12[facility]02/2017   [facility]   Past Medical History:  Diagnosis Date  . Aortic stenosis    mild echocardiogram 8/09 EF 60%, showed no regional wall motion abnml, mild LVH, mod focal basal septal hypertrophy and mild dyastolic dysfunction. 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 mean gradient across aortic valve was 8 mmHG. also mild L atrial enlargement and normal RV size and function. minimal AS on LHC in 7/10.   . Arthritis    "all over" (07/19/2016)  . BPH (benign prostatic hypertrophy)   . CAD (coronary artery disease)    LCH (7/10) totally occluded proximal RCA with very robuse L to R collaterals, 50% proximal LAD stenosis, EF 65%, medical management.    . CHF (congestive heart failure) ([place])   . Chicken pox   . Chronic lower back pain    s/p surgical fusion  . Diverticulosis   . Esophageal stricture   . GERD (gastroesophageal reflux disease)   . Heart murmur   . Hepatitis B 1984  . Hiatal hernia   . History of radiation therapy 01/16/18- 03/05/18   Left Tonsil, 66 Gy in 33 fractions to high risk nodal echelons.   Marland Kitchen HLD (hyperlipidemia)   . HTN (hypertension)   . 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 Osteoarthritis   . TIA (transient ischemic attack) 1990s   hx  . tonsillar ca dx'd 11/2017  . Tubular adenoma of colon 2009   BP (!) 151/62   Pulse 70   Temp 98.7 F (37.1 C)   Ht 5' 7.5" (1.715 m)   Wt 172 lb (78 kg)   SpO2 98%   BMI 26.54 kg/m   Opioid Risk Score:   Fall Risk Score:  `1  Depression screen [facility] 2/9  Depression screen [facility] 2/9 02/15/2020 02/08/2020 09/29/2019 09/23/2019 07/29/2019 05/27/2019 04/24/2019  Decreased Interest  0 1 0 0 1 1 1   Down, Depressed, Hopeless 1 1 0 0 0 0 0  PHQ - 2 Score 1 2 0 0 1 1 1   Altered sleeping - - 0 - - - 1  Tired, decreased energy - - 1 - - - 1  Change in appetite - - 0 - - - 0  Feeling bad or failure about yourself  - - 0 - - - 1  Trouble concentrating - - 0 - - - 0  Moving  slowly or fidgety/restless - - 0 - - - 0  Suicidal thoughts - - 0 - - - 0  PHQ-9 Score - - 1 - - - 4  Difficult doing work/chores - - - - - - -  Some recent data might be hidden     Review of Systems  Constitutional: Negative.   HENT: Negative.   Eyes: Negative.   Respiratory: Negative.   Cardiovascular: Negative.   Gastrointestinal: Negative.   Endocrine: Negative.   Genitourinary: Negative.   Musculoskeletal: Positive for arthralgias and back pain.  Skin: Negative.   Allergic/Immunologic: Negative.   Neurological: Positive for numbness.  Hematological: Negative.   Psychiatric/Behavioral: Negative.   All other systems reviewed and are negative.      Objective:   Physical Exam Awake, alert, appropriate, sitting on table, NAD Ecchymoses on hands and UEs B/L TTP over [facility] joint and anterior part of actual joint- not bicipital tendinitis- B/L Also has mild joint effusions of MCPs < PIPs B/L- not DIPs.  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   1. Chronic pain- has reduced Oxycodone some- some days 3 no more than 4 tabs/day. Has enough for 4 tabs/day in Rx- will con't 120 tabs/month- is usually pretty good with this.   2. Restless leg syndrome- con't Tramadol 100 mg BID and Gabapentin  3. Valve surgery- not doing open heart surgery- doing from L upper chest and stent via R wrist-   4. Con't Citalopram for mood/anxiety- is working- will con't current dose. Will refill today  5. Down to 6 mg daily for prednisone- is hurting more in shoulders and hands are worse- cannot grip pen.  So sore and stiff. Last reduced ~ 1 month ago.  Pt taking Oxycodone to compensate for stiffness/PMR.    6. Just got refills- will get Urine drug screen today- in the next 24 hours.   7 F/U in 3 months- earlier if need be.   I spent a total of 25 minutes on visit- discussing his heart surgery and advice on PMR- don't want pt taking Oxy for PMR Sx's- per our discussion. I don't think he can go lower on prednisone than 6 mg daily.

## 2020-04-26 ENCOUNTER — Telehealth: Payer: Self-pay | Admitting: *Deleted

## 2020-04-26 LAB — SARS CORONAVIRUS 2 (TAT 6-24 HRS): SARS Coronavirus 2: NEGATIVE

## 2020-04-26 NOTE — Telephone Encounter (Signed)
Pt contacted pre-coronary atherectomy scheduled at [facility] for: Wednesday April 27, 2020 10 AM Verified arrival time and place: [facility]) at: 8 AM   No solid food after midnight prior to cath, clear liquids until 5 AM day of procedure.   AM meds can be  taken pre-cath with sips of water including: ASA 81 mg Plavix 75 mg   Confirmed patient has responsible adult to drive home post procedure and be with patient first 24 hours after arriving home: yes  You are allowed ONE visitor in the waiting room during the time you are at the hospital for your procedure. Both you and your visitor must wear a mask once you enter the hospital.   Reviewed procedure/mask/visitor instructions with patient.

## 2020-04-27 ENCOUNTER — Encounter (HOSPITAL_COMMUNITY): Payer: Self-pay | Admitting: Cardiovascular Disease

## 2020-04-27 ENCOUNTER — Ambulatory Visit (HOSPITAL_COMMUNITY)
Admission: RE | Admit: 2020-04-27 | Discharge: 2020-04-28 | Disposition: A | Discharge status: Home / Self Care | Payer: Medicare Other | Attending: Cardiovascular Disease | Admitting: Cardiovascular Disease

## 2020-04-27 ENCOUNTER — Other Ambulatory Visit: Payer: Self-pay

## 2020-04-27 ENCOUNTER — Ambulatory Visit (HOSPITAL_COMMUNITY)
Admission: RE | Disposition: A | Discharge status: Home / Self Care | Payer: Medicare Other | Source: Home / Self Care | Attending: Cardiovascular Disease

## 2020-04-27 DIAGNOSIS — F17201 Nicotine dependence, unspecified, in remission: Secondary | ICD-10-CM | POA: Diagnosis present

## 2020-04-27 DIAGNOSIS — I25119 Atherosclerotic heart disease of native coronary artery with unspecified angina pectoris: Secondary | ICD-10-CM | POA: Diagnosis not present

## 2020-04-27 DIAGNOSIS — I352 Nonrheumatic aortic (valve) stenosis with insufficiency: Secondary | ICD-10-CM | POA: Diagnosis not present

## 2020-04-27 DIAGNOSIS — I2584 Coronary atherosclerosis due to calcified coronary lesion: Secondary | ICD-10-CM | POA: Diagnosis not present

## 2020-04-27 DIAGNOSIS — Z886 Allergy status to analgesic agent status: Secondary | ICD-10-CM | POA: Diagnosis not present

## 2020-04-27 DIAGNOSIS — I509 Heart failure, unspecified: Secondary | ICD-10-CM | POA: Insufficient documentation

## 2020-04-27 DIAGNOSIS — I110 Hypertensive heart disease with heart failure: Secondary | ICD-10-CM | POA: Diagnosis not present

## 2020-04-27 DIAGNOSIS — Z7982 Long term (current) use of aspirin: Secondary | ICD-10-CM | POA: Diagnosis not present

## 2020-04-27 DIAGNOSIS — F419 Anxiety disorder, unspecified: Secondary | ICD-10-CM | POA: Diagnosis present

## 2020-04-27 DIAGNOSIS — G894 Chronic pain syndrome: Secondary | ICD-10-CM | POA: Diagnosis present

## 2020-04-27 DIAGNOSIS — Z86010 Personal history of colonic polyps: Secondary | ICD-10-CM | POA: Diagnosis not present

## 2020-04-27 DIAGNOSIS — Z808 Family history of malignant neoplasm of other organs or systems: Secondary | ICD-10-CM | POA: Insufficient documentation

## 2020-04-27 DIAGNOSIS — E782 Mixed hyperlipidemia: Secondary | ICD-10-CM | POA: Diagnosis not present

## 2020-04-27 DIAGNOSIS — Z85818 Personal history of malignant neoplasm of other sites of lip, oral cavity, and pharynx: Secondary | ICD-10-CM | POA: Insufficient documentation

## 2020-04-27 DIAGNOSIS — I2510 Atherosclerotic heart disease of native coronary artery without angina pectoris: Secondary | ICD-10-CM | POA: Diagnosis present

## 2020-04-27 DIAGNOSIS — Z8249 Family history of ischemic heart disease and other diseases of the circulatory system: Secondary | ICD-10-CM | POA: Insufficient documentation

## 2020-04-27 DIAGNOSIS — Z8673 Personal history of transient ischemic attack (TIA), and cerebral infarction without residual deficits: Secondary | ICD-10-CM | POA: Insufficient documentation

## 2020-04-27 DIAGNOSIS — I10 Essential (primary) hypertension: Secondary | ICD-10-CM | POA: Diagnosis present

## 2020-04-27 DIAGNOSIS — Z800 Family history of malignant neoplasm of digestive organs: Secondary | ICD-10-CM | POA: Insufficient documentation

## 2020-04-27 DIAGNOSIS — I350 Nonrheumatic aortic (valve) stenosis: Secondary | ICD-10-CM

## 2020-04-27 DIAGNOSIS — Z79899 Other long term (current) drug therapy: Secondary | ICD-10-CM | POA: Insufficient documentation

## 2020-04-27 DIAGNOSIS — D509 Iron deficiency anemia, unspecified: Secondary | ICD-10-CM | POA: Diagnosis present

## 2020-04-27 DIAGNOSIS — Z87438 Personal history of other diseases of male genital organs: Secondary | ICD-10-CM | POA: Diagnosis present

## 2020-04-27 DIAGNOSIS — K219 Gastro-esophageal reflux disease without esophagitis: Secondary | ICD-10-CM | POA: Diagnosis present

## 2020-04-27 DIAGNOSIS — E785 Hyperlipidemia, unspecified: Secondary | ICD-10-CM | POA: Diagnosis present

## 2020-04-27 DIAGNOSIS — Z720 Tobacco use: Secondary | ICD-10-CM | POA: Diagnosis present

## 2020-04-27 DIAGNOSIS — D649 Anemia, unspecified: Secondary | ICD-10-CM | POA: Diagnosis present

## 2020-04-27 DIAGNOSIS — I7143 Infrarenal abdominal aortic aneurysm, without rupture: Secondary | ICD-10-CM | POA: Diagnosis present

## 2020-04-27 DIAGNOSIS — Z955 Presence of coronary angioplasty implant and graft: Secondary | ICD-10-CM

## 2020-04-27 DIAGNOSIS — Z87891 Personal history of nicotine dependence: Secondary | ICD-10-CM | POA: Insufficient documentation

## 2020-04-27 DIAGNOSIS — I714 Abdominal aortic aneurysm, without rupture: Secondary | ICD-10-CM | POA: Diagnosis not present

## 2020-04-27 DIAGNOSIS — M353 Polymyalgia rheumatica: Secondary | ICD-10-CM | POA: Diagnosis present

## 2020-04-27 DIAGNOSIS — F32A Depression, unspecified: Secondary | ICD-10-CM | POA: Diagnosis present

## 2020-04-27 DIAGNOSIS — I209 Angina pectoris, unspecified: Secondary | ICD-10-CM | POA: Diagnosis present

## 2020-04-27 DIAGNOSIS — Z8619 Personal history of other infectious and parasitic diseases: Secondary | ICD-10-CM | POA: Insufficient documentation

## 2020-04-27 DIAGNOSIS — C090 Malignant neoplasm of tonsillar fossa: Secondary | ICD-10-CM | POA: Diagnosis present

## 2020-04-27 HISTORY — PX: CORONARY IMAGING/OCT: CATH118326

## 2020-04-27 HISTORY — PX: CORONARY ATHERECTOMY: CATH118238

## 2020-04-27 HISTORY — PX: INTRAVASCULAR PRESSURE WIRE/FFR STUDY: CATH118243

## 2020-04-27 HISTORY — PX: INTRAVASCULAR IMAGING/OCT: CATH118326

## 2020-04-27 HISTORY — PX: CORONARY STENT INTERVENTION: CATH118234

## 2020-04-27 HISTORY — PX: CORONARY PRESSURE/FFR STUDY: CATH118243

## 2020-04-27 LAB — POCT ACTIVATED CLOTTING TIME
Activated Clotting Time: 386 seconds
Activated Clotting Time: 243 seconds
Activated Clotting Time: 368 seconds

## 2020-04-27 SURGERY — CORONARY ATHERECTOMY
Anesthesia: LOCAL

## 2020-04-27 MED ORDER — HEPARIN SODIUM (PORCINE) 1000 UNIT/ML IJ SOLN
INTRAMUSCULAR | Status: AC
  Filled 2020-04-27: qty 1

## 2020-04-27 MED ORDER — ONDANSETRON HCL 4 MG/2ML IJ SOLN: 4.0000 mg | Freq: Four times a day (QID) | INTRAMUSCULAR | Status: DC | PRN

## 2020-04-27 MED ORDER — NITROGLYCERIN 1 MG/10 ML FOR IR/CATH LAB
INTRA_ARTERIAL | Status: AC
  Filled 2020-04-27: qty 10

## 2020-04-27 MED ORDER — HEPARIN SODIUM (PORCINE) 1000 UNIT/ML IJ SOLN
INTRAMUSCULAR | Status: DC | PRN
  Administered 2020-04-27: 3000 [IU] via INTRAVENOUS
  Administered 2020-04-27: 8000 [IU] via INTRAVENOUS

## 2020-04-27 MED ORDER — SODIUM CHLORIDE 0.9 % WEIGHT BASED INFUSION
1.0000 mL/kg/h | INTRAVENOUS | Status: DC
  Administered 2020-04-27: 250 mL via INTRAVENOUS

## 2020-04-27 MED ORDER — CLOPIDOGREL BISULFATE 75 MG PO TABS: 75.0000 mg | ORAL_TABLET | ORAL | Status: DC

## 2020-04-27 MED ORDER — HEPARIN (PORCINE) IN NACL 2000-0.9 UNIT/L-% IV SOLN
INTRAVENOUS | Status: AC
  Filled 2020-04-27: qty 1000

## 2020-04-27 MED ORDER — CITALOPRAM HYDROBROMIDE 20 MG PO TABS
20.0000 mg | ORAL_TABLET | Freq: Every day | ORAL | Status: DC
  Administered 2020-04-28: 20 mg via ORAL
  Filled 2020-04-27: qty 1

## 2020-04-27 MED ORDER — FENTANYL CITRATE (PF) 100 MCG/2ML IJ SOLN
INTRAMUSCULAR | Status: AC
  Filled 2020-04-27: qty 2

## 2020-04-27 MED ORDER — NICOTINE 21 MG/24HR TD PT24
21.0000 mg | MEDICATED_PATCH | Freq: Every day | TRANSDERMAL | Status: DC
  Administered 2020-04-28: 21 mg via TRANSDERMAL
  Filled 2020-04-27: qty 1

## 2020-04-27 MED ORDER — SODIUM CHLORIDE 0.9% FLUSH: 3.0000 mL | INTRAVENOUS | Status: DC | PRN

## 2020-04-27 MED ORDER — SODIUM CHLORIDE 0.9 % IV SOLN
5.0000 mg/kg | INTRAVENOUS | Status: AC
  Filled 2020-04-27: qty 15.78

## 2020-04-27 MED ORDER — SODIUM CHLORIDE 0.9 % IV SOLN: 250.0000 mL | INTRAVENOUS | Status: DC | PRN

## 2020-04-27 MED ORDER — CARVEDILOL 6.25 MG PO TABS
6.2500 mg | ORAL_TABLET | Freq: Two times a day (BID) | ORAL | Status: DC
  Administered 2020-04-27 – 2020-04-28 (×2): 6.25 mg via ORAL
  Filled 2020-04-27 (×2): qty 1

## 2020-04-27 MED ORDER — CLOPIDOGREL BISULFATE 75 MG PO TABS
75.0000 mg | ORAL_TABLET | Freq: Every day | ORAL | Status: DC
  Administered 2020-04-28: 75 mg via ORAL
  Filled 2020-04-27: qty 1

## 2020-04-27 MED ORDER — ACETAMINOPHEN 325 MG PO TABS: 650.0000 mg | ORAL_TABLET | ORAL | Status: DC | PRN

## 2020-04-27 MED ORDER — MIDAZOLAM HCL 2 MG/2ML IJ SOLN
INTRAMUSCULAR | Status: DC | PRN
  Administered 2020-04-27 (×2): 1 mg via INTRAVENOUS
  Administered 2020-04-27: 2 mg via INTRAVENOUS

## 2020-04-27 MED ORDER — ASPIRIN EC 81 MG PO TBEC
81.0000 mg | DELAYED_RELEASE_TABLET | Freq: Every day | ORAL | Status: DC
  Administered 2020-04-27: 81 mg via ORAL
  Filled 2020-04-27: qty 1

## 2020-04-27 MED ORDER — TAMSULOSIN HCL 0.4 MG PO CAPS
0.8000 mg | ORAL_CAPSULE | Freq: Every day | ORAL | Status: DC
  Administered 2020-04-28: 0.8 mg via ORAL
  Filled 2020-04-27: qty 2

## 2020-04-27 MED ORDER — VERAPAMIL HCL 2.5 MG/ML IV SOLN
INTRAVENOUS | Status: AC
  Filled 2020-04-27: qty 2

## 2020-04-27 MED ORDER — LISINOPRIL 10 MG PO TABS
10.0000 mg | ORAL_TABLET | Freq: Every day | ORAL | Status: DC
  Administered 2020-04-28: 10 mg via ORAL
  Filled 2020-04-27: qty 1

## 2020-04-27 MED ORDER — PREDNISONE 5 MG PO TABS
6.0000 mg | ORAL_TABLET | Freq: Every day | ORAL | Status: DC
  Administered 2020-04-28: 6 mg via ORAL
  Filled 2020-04-27: qty 1

## 2020-04-27 MED ORDER — ISOSORBIDE MONONITRATE ER 30 MG PO TB24
30.0000 mg | ORAL_TABLET | Freq: Every day | ORAL | Status: DC
  Administered 2020-04-28: 30 mg via ORAL
  Filled 2020-04-27: qty 1

## 2020-04-27 MED ORDER — SODIUM CHLORIDE 0.9 % WEIGHT BASED INFUSION
3.0000 mL/kg/h | INTRAVENOUS | Status: DC
  Administered 2020-04-27: 3 mL/kg/h via INTRAVENOUS

## 2020-04-27 MED ORDER — SODIUM CHLORIDE 0.9 % IV SOLN
INTRAVENOUS | Status: AC | PRN
  Administered 2020-04-27: 394.5 mg via INTRAVENOUS

## 2020-04-27 MED ORDER — FENTANYL CITRATE (PF) 100 MCG/2ML IJ SOLN
INTRAMUSCULAR | Status: DC | PRN
  Administered 2020-04-27 (×3): 25 ug via INTRAVENOUS

## 2020-04-27 MED ORDER — GABAPENTIN 300 MG PO CAPS
300.0000 mg | ORAL_CAPSULE | Freq: Every day | ORAL | Status: DC
  Administered 2020-04-27: 300 mg via ORAL
  Filled 2020-04-27: qty 1

## 2020-04-27 MED ORDER — SODIUM CHLORIDE 0.9% FLUSH
3.0000 mL | INTRAVENOUS | Status: DC | PRN
Start: 2020-04-27 — End: 2020-04-27

## 2020-04-27 MED ORDER — SODIUM CHLORIDE 0.9% FLUSH
3.0000 mL | Freq: Two times a day (BID) | INTRAVENOUS | Status: DC
  Administered 2020-04-27: 3 mL via INTRAVENOUS

## 2020-04-27 MED ORDER — PANTOPRAZOLE SODIUM 40 MG PO TBEC
40.0000 mg | DELAYED_RELEASE_TABLET | Freq: Every day | ORAL | Status: DC
  Administered 2020-04-28: 40 mg via ORAL
  Filled 2020-04-27: qty 1

## 2020-04-27 MED ORDER — IOHEXOL 350 MG/ML SOLN
INTRAVENOUS | Status: AC
  Filled 2020-04-27: qty 1

## 2020-04-27 MED ORDER — SODIUM CHLORIDE 0.9 % WEIGHT BASED INFUSION
1.0000 mL/kg/h | INTRAVENOUS | Status: AC
  Administered 2020-04-27: 1 mL/kg/h via INTRAVENOUS

## 2020-04-27 MED ORDER — VERAPAMIL HCL 2.5 MG/ML IV SOLN
INTRAVENOUS | Status: DC | PRN
  Administered 2020-04-27: 10 mL via INTRA_ARTERIAL

## 2020-04-27 MED ORDER — OXYCODONE HCL 5 MG PO TABS
5.0000 mg | ORAL_TABLET | Freq: Four times a day (QID) | ORAL | Status: DC | PRN
  Administered 2020-04-27: 5 mg via ORAL
  Filled 2020-04-27: qty 1

## 2020-04-27 MED ORDER — LIDOCAINE HCL (PF) 1 % IJ SOLN
INTRAMUSCULAR | Status: DC | PRN
  Administered 2020-04-27: 2 mL

## 2020-04-27 MED ORDER — ASPIRIN 81 MG PO CHEW
81.0000 mg | CHEWABLE_TABLET | ORAL | Status: AC
  Administered 2020-04-27: 81 mg via ORAL
  Filled 2020-04-27: qty 1

## 2020-04-27 MED ORDER — ZOLPIDEM TARTRATE 5 MG PO TABS
5.0000 mg | ORAL_TABLET | Freq: Every evening | ORAL | Status: DC | PRN
  Administered 2020-04-27: 5 mg via ORAL
  Filled 2020-04-27: qty 1

## 2020-04-27 MED ORDER — LIDOCAINE HCL (PF) 1 % IJ SOLN
INTRAMUSCULAR | Status: AC
  Filled 2020-04-27: qty 30

## 2020-04-27 MED ORDER — HEPARIN (PORCINE) IN NACL 1000-0.9 UT/500ML-% IV SOLN
INTRAVENOUS | Status: DC | PRN
  Administered 2020-04-27 (×2): 500 mL

## 2020-04-27 MED ORDER — TRAMADOL HCL 50 MG PO TABS
100.0000 mg | ORAL_TABLET | Freq: Two times a day (BID) | ORAL | Status: DC
  Administered 2020-04-27 – 2020-04-28 (×2): 100 mg via ORAL
  Filled 2020-04-27 (×2): qty 2

## 2020-04-27 MED ORDER — FLUTICASONE PROPIONATE 50 MCG/ACT NA SUSP
2.0000 | Freq: Every day | NASAL | Status: DC | PRN
  Filled 2020-04-27: qty 16

## 2020-04-27 MED ORDER — AMLODIPINE BESYLATE 10 MG PO TABS
10.0000 mg | ORAL_TABLET | Freq: Every day | ORAL | Status: DC
  Administered 2020-04-28: 10 mg via ORAL
  Filled 2020-04-27: qty 1

## 2020-04-27 MED ORDER — HYDRALAZINE HCL 20 MG/ML IJ SOLN
10.0000 mg | INTRAMUSCULAR | Status: AC | PRN
Start: 2020-04-27 — End: 2020-04-27

## 2020-04-27 MED ORDER — VIPERSLIDE LUBRICANT OPTIME
TOPICAL | Status: DC | PRN
  Administered 2020-04-27: 20 mL via SURGICAL_CAVITY

## 2020-04-27 MED ORDER — HEPARIN (PORCINE) IN NACL 1000-0.9 UT/500ML-% IV SOLN
INTRAVENOUS | Status: AC
  Filled 2020-04-27: qty 1000

## 2020-04-27 MED ORDER — IOHEXOL 350 MG/ML SOLN
INTRAVENOUS | Status: DC | PRN
  Administered 2020-04-27: 110 mL

## 2020-04-27 MED ORDER — ATORVASTATIN CALCIUM 80 MG PO TABS
80.0000 mg | ORAL_TABLET | Freq: Every day | ORAL | Status: DC
  Administered 2020-04-27: 80 mg via ORAL
  Filled 2020-04-27: qty 2

## 2020-04-27 MED ORDER — MIDAZOLAM HCL 2 MG/2ML IJ SOLN
INTRAMUSCULAR | Status: AC
  Filled 2020-04-27: qty 2

## 2020-04-27 MED ORDER — LABETALOL HCL 5 MG/ML IV SOLN: 10.0000 mg | INTRAVENOUS | Status: AC | PRN

## 2020-04-27 SURGICAL SUPPLY — 22 items
BALLN SAPPHIRE 3.0X12 (BALLOONS) ×2
BALLN ~~LOC~~ SAPPHIRE 4.5X10 (BALLOONS) ×2
BALLOON SAPPHIRE 3.0X12 (BALLOONS) IMPLANT
BALLOON ~~LOC~~ SAPPHIRE 4.5X10 (BALLOONS) IMPLANT
CATH DRAGONFLY OPSTAR (CATHETERS) ×1 IMPLANT
CATH LAUNCHER 6FR EBU3.5 (CATHETERS) ×1 IMPLANT
CROWN DIAMONDBACK CLASSIC 1.25 (BURR) ×1 IMPLANT
DEVICE RAD COMP TR BAND LRG (VASCULAR PRODUCTS) ×1 IMPLANT
ELECT DEFIB PAD ADLT CADENCE (PAD) ×1 IMPLANT
GLIDESHEATH SLEND SS 6F .021 (SHEATH) ×1 IMPLANT
GUIDEWIRE INQWIRE 1.5J.035X260 (WIRE) IMPLANT
GUIDEWIRE PRESSURE X 175 (WIRE) ×1 IMPLANT
INQWIRE 1.5J .035X260CM (WIRE) ×2
KIT ENCORE 26 ADVANTAGE (KITS) ×1 IMPLANT
KIT HEART LEFT (KITS) ×2 IMPLANT
LUBRICANT VIPERSLIDE CORONARY (MISCELLANEOUS) ×1 IMPLANT
PACK CARDIAC CATHETERIZATION (CUSTOM PROCEDURE TRAY) ×2 IMPLANT
STENT RESOLUTE ONYX 4.0X12 (Permanent Stent) ×1 IMPLANT
TRANSDUCER W/STOPCOCK (MISCELLANEOUS) ×2 IMPLANT
TUBING CIL FLEX 10 FLL-RA (TUBING) ×2 IMPLANT
WIRE COUGAR XT STRL 190CM (WIRE) ×1 IMPLANT
WIRE VIPERWIRE COR FLEX .012 (WIRE) ×1 IMPLANT

## 2020-04-27 NOTE — Interval H&P Note (Signed)
Cath Lab Visit (complete for each Cath Lab visit)  Clinical Evaluation Leading to the Procedure:   ACS: No.  Non-ACS:    Anginal Classification: CCS III  Anti-ischemic medical therapy: Maximal Therapy (2 or more classes of medications)  Non-Invasive Test Results: No non-invasive testing performed  Prior CABG: No previous CABG      History and Physical Interval Note:  04/27/2020 10:56 AM  Brandon [name]  has presented today for surgery, with the diagnosis of CAD.  The various methods of treatment have been discussed with the patient and family. After consideration of risks, benefits and other options for treatment, the patient has consented to  Procedure(s): CORONARY ATHERECTOMY (N/A) CORONARY STENT INTERVENTION (N/A) INTRAVASCULAR IMAGING/OCT (N/A) as a surgical intervention.  The patient's history has been reviewed, patient examined, no change in status, stable for surgery.  I have reviewed the patient's chart and labs.  Questions were answered to the patient's satisfaction.     Sherren Mocha

## 2020-04-28 DIAGNOSIS — Z79899 Other long term (current) drug therapy: Secondary | ICD-10-CM | POA: Diagnosis not present

## 2020-04-28 DIAGNOSIS — Z86010 Personal history of colonic polyps: Secondary | ICD-10-CM | POA: Diagnosis not present

## 2020-04-28 DIAGNOSIS — Z8619 Personal history of other infectious and parasitic diseases: Secondary | ICD-10-CM | POA: Diagnosis not present

## 2020-04-28 DIAGNOSIS — Z87891 Personal history of nicotine dependence: Secondary | ICD-10-CM | POA: Diagnosis not present

## 2020-04-28 DIAGNOSIS — I509 Heart failure, unspecified: Secondary | ICD-10-CM | POA: Diagnosis not present

## 2020-04-28 DIAGNOSIS — I714 Abdominal aortic aneurysm, without rupture: Secondary | ICD-10-CM | POA: Diagnosis not present

## 2020-04-28 DIAGNOSIS — I110 Hypertensive heart disease with heart failure: Secondary | ICD-10-CM | POA: Diagnosis not present

## 2020-04-28 DIAGNOSIS — I352 Nonrheumatic aortic (valve) stenosis with insufficiency: Secondary | ICD-10-CM | POA: Diagnosis not present

## 2020-04-28 DIAGNOSIS — Z800 Family history of malignant neoplasm of digestive organs: Secondary | ICD-10-CM | POA: Diagnosis not present

## 2020-04-28 DIAGNOSIS — I2584 Coronary atherosclerosis due to calcified coronary lesion: Secondary | ICD-10-CM | POA: Diagnosis not present

## 2020-04-28 DIAGNOSIS — Z8673 Personal history of transient ischemic attack (TIA), and cerebral infarction without residual deficits: Secondary | ICD-10-CM | POA: Diagnosis not present

## 2020-04-28 DIAGNOSIS — I25119 Atherosclerotic heart disease of native coronary artery with unspecified angina pectoris: Secondary | ICD-10-CM | POA: Diagnosis not present

## 2020-04-28 DIAGNOSIS — Z886 Allergy status to analgesic agent status: Secondary | ICD-10-CM | POA: Diagnosis not present

## 2020-04-28 DIAGNOSIS — Z7982 Long term (current) use of aspirin: Secondary | ICD-10-CM | POA: Diagnosis not present

## 2020-04-28 DIAGNOSIS — E782 Mixed hyperlipidemia: Secondary | ICD-10-CM | POA: Diagnosis not present

## 2020-04-28 DIAGNOSIS — Z85818 Personal history of malignant neoplasm of other sites of lip, oral cavity, and pharynx: Secondary | ICD-10-CM | POA: Diagnosis not present

## 2020-04-28 LAB — BASIC METABOLIC PANEL
Anion gap: 5 (ref 5–15)
BUN: 7 mg/dL — ABNORMAL LOW (ref 8–23)
CO2: 29 mmol/L (ref 22–32)
Calcium: 8.5 mg/dL — ABNORMAL LOW (ref 8.9–10.3)
Chloride: 106 mmol/L (ref 98–111)
Creatinine, Ser: 0.65 mg/dL (ref 0.61–1.24)
GFR, Estimated: >60 mL/min (ref >60)
Glucose, Bld: 89 mg/dL (ref 70–99)
Potassium: 3.2 mmol/L — ABNORMAL LOW (ref 3.5–5.1)
Sodium: 140 mmol/L (ref 135–145)

## 2020-04-28 LAB — CBC
HCT: 33.8 % — ABNORMAL LOW (ref 39.0–52.0)
Hemoglobin: 10.8 g/dL — ABNORMAL LOW (ref 13.0–17.0)
MCH: 26.2 pg (ref 26.0–34.0)
MCHC: 32 g/dL (ref 30.0–36.0)
MCV: 81.8 fL (ref 80.0–100.0)
Platelets: 123 10*3/uL — ABNORMAL LOW (ref 150–400)
RBC: 4.13 MIL/uL — ABNORMAL LOW (ref 4.22–5.81)
RDW: 14.6 % (ref 11.5–15.5)
WBC: 4.7 10*3/uL (ref 4.0–10.5)
nRBC: 0 % (ref 0.0–0.2)

## 2020-04-28 MED ORDER — POTASSIUM CHLORIDE CRYS ER 20 MEQ PO TBCR
40.0000 meq | EXTENDED_RELEASE_TABLET | Freq: Once | ORAL | Status: AC
  Administered 2020-04-28: 40 meq via ORAL
  Filled 2020-04-28: qty 2

## 2020-04-28 MED FILL — Nitroglycerin IV Soln 100 MCG/ML in D5W: INTRA_ARTERIAL | Qty: 10 | Status: AC

## 2020-04-28 MED FILL — Heparin Sod (Porcine)-NaCl IV Soln 1000 Unit/500ML-0.9%: INTRAVENOUS | Qty: 500 | Status: AC

## 2020-04-28 NOTE — Discharge Instructions (Signed)

## 2020-04-28 NOTE — Progress Notes (Signed)
CARDIAC REHAB PHASE I   PRE:  Rate/Rhythm: 63 SR    BP: sitting 157/69    SaO2:   MODE:  Ambulation: 650 ft   POST:  Rate/Rhythm: 79 SR    BP: sitting 153/78     SaO2: 97 RA  Tolerated well, no c/o. Ed completed. Understands importance of Plavix. Plans to d/c on nicotine patch and quit smoking. Will refer to [facility] but pt sts he would prefer to wait till all of his appointments calm down. [place], ACSM 04/28/2020 9:19 AM

## 2020-04-28 NOTE — Discharge Summary (Addendum)
[facility] TEAM  Discharge Summary    Patient ID: Brandon [name] MRN: [id number]; DOB: 27-Oct-1940  Admit date: 04/27/2020 Discharge date: 04/28/2020  Primary Care Provider: Haydee Salter, MD  Primary Cardiologist: Loralie Champagne, MD   Discharge Diagnoses    Principal Problem:   Angina pectoris [facility]) Active Problems:   Hyperlipidemia   Coronary artery disease   Severe aortic stenosis   Gastroesophageal reflux disease   History of benign prostatic hyperplasia   Essential hypertension   Tobacco abuse disorder   Aneurysm of infrarenal abdominal aorta (HCC)   Normocytic anemia   Cancer of tonsillar fossa (HCC)   Anxiety and depression   Chronic pain syndrome   Polymyalgia rheumatica (HCC)   Allergies Allergies  Allergen Reactions  . Celebrex [Celecoxib] Hives and Itching    Diagnostic Studies/Procedures    04/27/2020 Procedures INTRAVASCULAR IMAGING/OCT  CORONARY STENT INTERVENTION  CORONARY ATHERECTOMY  INTRAVASCULAR PRESSURE WIRE/FFR STUDY  Conclusion  1.  Successful orbital atherectomy and stenting of severe calcific stenosis in the proximal LAD, procedure guided by OCT imaging, treated with a 4.0 x 12 mm resolute Onyx DES 2.  Moderate left circumflex stenosis with negative pressure wire analysis.  Plan: Overnight observation, likely discharge home tomorrow as long as no complications arise.  Continue antiplatelet therapy with aspirin and clopidogrel minimum 6 months, plan to proceed with TAVR in staged fashion.  Recommendations  Antiplatelet/Anticoag Recommend uninterrupted dual antiplatelet therapy with Aspirin 81mg  daily and Clopidogrel 75mg  daily for a minimum of 6 months (stable ischemic heart disease-Class I recommendation).    _____________   History of Present Illness     Brandon [name] is a 80 y.o. 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   The patient has been followed for aortic stenosis with surveillance echo studies and close clinical care by Dr. Marigene Ehlers.  He has developed progressive and now severe aortic stenosis with mean gradient of 50 mmHg and calculated valve area of 0.96 cm.  LVEF remains preserved at 60 to 65%.  He underwent cardiac catheterization for preoperative assessment and was demonstrated to have chronic occlusion of the right coronary artery with left-to-right collaterals, severe calcific proximal LAD stenosis, and moderately severe long segment mid circumflex stenosis. The patient reported exertional chest pain and dyspnea. He was referred to the multidisciplinary valve team for consideration of SAVR+CABG or TAVR+PCI. Cardiac gated CTA of the heart revealed anatomical characteristics consistent with aortic stenosis suitable for treatment by transcatheter aortic valve replacement without any significant complicating features. CTA of the aorta and iliac vessels demonstrated a large infrarenal abdominal aortic aneurysm which is increasing in size, currently measuring 5.0x 4.2 cm in diameter (previously 4.3 cm on 02/26/2017). He has been set up for vascular consultation with Dr. Donzetta Matters on 4/8. He has been seen by Dr. Burt Knack and Dr. Cyndia Bent who felt PCI+ TAVR via the subclavian approach was the most appropriate course of action for the patient. PCI was set up for 04/27/2020.  Hospital Course     Consultants: none  CAD with angina: he underwent successful orbital atherectomy and stenting of severe calcific stenosis in  the proximal LAD, procedure guided by OCT imaging, treated with a  4.0 x 12 mm resolute Onyx DES. There was moderate left circumflex stenosis with negative pressure wire analysis. He will be continued on uninterrupted DAPT with aspirin and Plavix x 6 months. Continue statin, BB and nitrates.   AAA: pre TAVR CT showed a large infrarenal abdominal aortic aneurysm which is increasing in size, currently measuring 5.0x 4.2 cm in diameter (previously 4.3 cm on 02/26/2017). He has been set up for vascular consultation with Dr. Donzetta Matters on 4/8  Severe AS: he is being closely followed by the structural heart team and we will plan for staged TAVR via the subclavian approach (given AAA) in May.  HTN: Bp has been elevated. Resumed on home meds. Will continue to monitor  Hypokalemia: supplemented prior to discharge.  _____________  Discharge Vitals Blood pressure 137/76, pulse 65, temperature 98.3 F (36.8 C), temperature source Oral, resp. rate 14, height 5' 7.5" (1.715 m), weight 76.7 kg, SpO2 95 %.  Filed Weights   04/27/20 0840 04/28/20 0430  Weight: 78.9 kg 76.7 kg    GEN: Well nourished, well developed, in no acute distress HEENT: normal Neck: no JVD or masses Cardiac: RRR; harsh 3/6 SEM No  rubs, or gallops,no edema  Respiratory:  clear to auscultation bilaterally, normal work of breathing GI: soft, nontender, nondistended, + BS MS: no deformity or atrophy Skin: warm and dry, no rash. Radial site with no hematoma or ecchymosis Neuro:  Alert and Oriented x 3, Strength and sensation are intact Psych: euthymic mood, full affect   Labs & Radiologic Studies    CBC Recent Labs    04/28/20 0241  WBC 4.7  HGB 10.8*  HCT 33.8*  MCV 81.8  PLT 956*   Basic Metabolic Panel Recent Labs    04/28/20 0241  NA 140  K 3.2*  CL 106  CO2 29  GLUCOSE 89  BUN 7*  CREATININE 0.65  CALCIUM 8.5*   Liver Function Tests No results for input(s): AST, ALT, ALKPHOS, BILITOT, PROT, ALBUMIN in the last 72 hours. No results for input(s): LIPASE, AMYLASE in the last  72 hours. Cardiac Enzymes No results for input(s): CKTOTAL, CKMB, CKMBINDEX, TROPONINI in the last 72 hours. BNP Invalid input(s): POCBNP D-Dimer No results for input(s): DDIMER in the last 72 hours. Hemoglobin A1C No results for input(s): HGBA1C in the last 72 hours. Fasting Lipid Panel No results for input(s): CHOL, HDL, LDLCALC, TRIG, CHOLHDL, LDLDIRECT in the last 72 hours. Thyroid Function Tests No results for input(s): TSH, T4TOTAL, T3FREE, THYROIDAB in the last 72 hours.  Invalid input(s): FREET3 _____________  CARDIAC CATHETERIZATION  Result Date: 04/27/2020 1.  Successful orbital atherectomy and stenting of severe calcific stenosis in the proximal LAD, procedure guided by OCT imaging, treated with a 4.0 x 12 mm resolute Onyx DES 2.  Moderate left circumflex stenosis with negative pressure wire analysis. Plan: Overnight observation, likely discharge home tomorrow as long as no complications arise.  Continue antiplatelet therapy with aspirin and clopidogrel minimum 6 months, plan to proceed with TAVR in staged fashion.  CARDIAC CATHETERIZATION  Result Date: 03/31/2020 1. Low filling pressures. 2. Preserved cardiac output. 3. Known occluded RCA with robust collaterals from LAD. 4. 95% calcified proximal LAD stenosis (LAD provides RCA collaterals). 5. 60-70% proximal LCx stenosis. Patient has known severe AS (from echo).  I reviewed films with Drs Angelena Form and Burt Knack.  PCI-TAVR would be an option, would likely need atherectomy to address LAD and then  PCI LCx.  SAVR-CABG would also be an option, and may be safer in the long term as any compromise to an LAD stent would affect both LAD and RCA territory. Patient will be seen by cardiac surgeon and get scans for TAVR planning.    CT CORONARY MORPH W/CTA COR W/SCORE W/CA W/CM &/OR WO/CM  Addendum Date: 04/05/2020   ADDENDUM REPORT: 04/05/2020 13:29 CLINICAL DATA:  80 year old male with severe aortic stenosis being evaluated for a TAVR  procedure. EXAM: Cardiac TAVR CT TECHNIQUE: The patient was scanned on a [employer]. A 120 kV retrospective scan was triggered in the descending thoracic aorta at 111 HU's. Gantry rotation speed was 250 msecs and collimation was .6 mm. 25 mg of PO Metoprolol or nitro were given. The 3D data set was reconstructed in 5% intervals of the R-R cycle. Systolic and diastolic phases were analyzed on a dedicated work station using MPR, MIP and VRT modes. The patient received 80 cc of contrast. FINDINGS: Aortic Valve: Tri-leaflet aortic valve with severely calcified and thickened leaflets, severely restricted leaflet opening and asymmetric calcifications extending into the LVOT under the left coronary cusp. Aorta: Normal size, moderate diffuse atherosclerotic plaque with calcifications. Sinotubular Junction: 32 x 32 mm Ascending Thoracic Aorta: 38 x 37 mm Aortic Arch: 33 x 31 mm Descending Thoracic Aorta: 27 x 25 mm Sinus of Valsalva Measurements: Non-coronary: 38 mm Right -coronary: 38 mm Left -coronary:  38 mm Coronary Artery Height above Annulus: Left Main: 16 mm Right Coronary: 21 mm Virtual Basal Annulus Measurements: Maximum/Minimum Diameter: 29.8 x 24.4 mm Mean Diameter: 26.9 mm Perimeter: 86.6 mm Area: 569 mm2 Optimum Fluoroscopic Angle for Delivery: LAO 5 CAU 4 IMPRESSION: 1. Tri-leaflet aortic valve with severely calcified and thickened leaflets, severely restricted leaflet opening and asymmetric calcifications extending into the LVOT under the left coronary cusp. Aortic valve calcium score is 5106 consistent with severe aortic stenosis. Annular measurements suitable for delivery of a 29 mm Edwards-SAPIEN 3 Ultra valve. 2. Sufficient coronary to annulus distance. 3. Optimum Fluoroscopic Angle for Delivery: LAO 5 CAU 4. 4. No thrombus in the left atrial appendage. Electronically Signed   By: Ena Dawley   On: 04/05/2020 13:29   Result Date: 04/05/2020 EXAM: OVER-READ INTERPRETATION  CT CHEST The  following report is an over-read performed by radiologist Dr. Vinnie Langton of [facility], [place] on 04/05/2020. This over-read does not include interpretation of cardiac or coronary anatomy or pathology. The coronary calcium score/coronary CTA interpretation by the cardiologist is attached. COMPARISON:  Chest CT 05/29/2018. FINDINGS: Extracardiac findings will be described separately under dictation for contemporaneously obtained CTA chest, abdomen and pelvis. IMPRESSION: Please see separate dictation for contemporaneously obtained CTA chest, abdomen and pelvis dated 04/05/2020 for full description of relevant extracardiac findings. Electronically Signed: By: Vinnie Langton M.D. On: 04/05/2020 11:32   CT ANGIO CHEST AORTA W/CM & OR WO/CM  Result Date: 04/06/2020 CLINICAL DATA:  81 year old male with history of severe aortic stenosis. Preprocedural study prior to potential transcatheter aortic valve replacement (TAVR) procedure. EXAM: CT ANGIOGRAPHY CHEST, ABDOMEN AND PELVIS TECHNIQUE: Multidetector CT imaging through the chest, abdomen and pelvis was performed using the standard protocol during bolus administration of intravenous contrast. Multiplanar reconstructed images and MIPs were obtained and reviewed to evaluate the vascular anatomy. CONTRAST:  113mL OMNIPAQUE IOHEXOL 350 MG/ML SOLN COMPARISON:  Chest CT 05/29/2018. CT the abdomen and pelvis 02/26/2017. FINDINGS: CTA CHEST FINDINGS Cardiovascular: Heart size is normal. There is no significant pericardial fluid, thickening  or pericardial calcification. There is aortic atherosclerosis, as well as atherosclerosis of the great vessels of the mediastinum and the coronary arteries, including calcified atherosclerotic plaque in the left main, left anterior descending, left circumflex and right coronary arteries. Severe thickening calcification of the aortic valve. Extensive calcifications of the mitral-aortic intervalvular fibrosa also noted.  Mediastinum/Lymph Nodes: No pathologically enlarged mediastinal or hilar lymph nodes. Esophagus is unremarkable in appearance. No axillary lymphadenopathy. Lungs/Pleura: No suspicious appearing pulmonary nodules or masses are noted. No acute consolidative airspace disease. No pleural effusions. Musculoskeletal/Soft Tissues: There are no aggressive appearing lytic or blastic lesions noted in the visualized portions of the skeleton. CTA ABDOMEN AND PELVIS FINDINGS Hepatobiliary: Liver has a nodular contour, concerning for underlying cirrhosis. No suspicious cystic or solid hepatic lesions. No intra or extrahepatic biliary ductal dilatation. Status post cholecystectomy. Pancreas: No pancreatic mass. No pancreatic ductal dilatation. No pancreatic or peripancreatic fluid collections or inflammatory changes. Spleen: Unremarkable. Adrenals/Urinary Tract: Right kidney and bilateral adrenal glands are normal in appearance. Scarring in the lower pole of the left kidney. No hydroureteronephrosis. Urinary bladder is normal in appearance. Stomach/Bowel: Normal appearance of the stomach. No pathologic dilatation of small bowel or colon. Numerous colonic diverticulae are noted, particularly in the descending colon and sigmoid colon, without surrounding inflammatory changes to suggest an acute diverticulitis at this time. Normal appendix. Vascular/Lymphatic: Aortic atherosclerosis with fusiform aneurysmal dilatation of the infrarenal abdominal aorta which measures up to 5.0 x 4.2 cm in diameter. Vascular findings and measurements pertinent to potential TAVR procedure, as detailed below. No lymphadenopathy noted in the abdomen or pelvis. Reproductive: Prostate gland and seminal vesicles are unremarkable in appearance. Other: No significant volume of ascites.  No pneumoperitoneum. Musculoskeletal: Interbody cages at L4-L5. There are no aggressive appearing lytic or blastic lesions noted in the visualized portions of the skeleton.  VASCULAR MEASUREMENTS PERTINENT TO TAVR: AORTA: Minimal Aortic Diameter-11 x 9 mm Severity of Aortic Calcification-severe RIGHT PELVIS: Right Common Iliac Artery - Minimal Diameter-7.2 x 5.3 mm Tortuosity-severe Calcification-moderate to severe Right External Iliac Artery - Minimal Diameter-6.5 x 6.5 mm Tortuosity-moderate Calcification-moderate Right Common Femoral Artery - Minimal Diameter-6.4 x 7.3 mm Tortuosity-mild Calcification-moderate to severe LEFT PELVIS: Left Common Iliac Artery - Minimal Diameter-7.2 x 5.9 mm Tortuosity-severe Calcification-moderate to severe Left External Iliac Artery - Minimal Diameter-6.8 x 7.0 mm Tortuosity-moderate Calcification-mild Left Common Femoral Artery - Minimal Diameter-8.6 x 8.4 mm Tortuosity-mild Calcification-moderate Review of the MIP images confirms the above findings. IMPRESSION: 1. Vascular findings and measurements pertinent to potential TAVR procedure, as detailed above. 2. Severe thickening calcification of the aortic valve, compatible with reported clinical history of severe aortic stenosis. 3. Aortic atherosclerosis, in addition to left main and 3 vessel coronary artery disease. There is also a large infrarenal abdominal aortic aneurysm which is increasing in size, currently measuring 5.0 x 4.2 cm in diameter (previously 4.3 cm on 02/26/2017). Recommend referral to a vascular specialist. This recommendation follows ACR consensus guidelines: White Paper of the ACR Incidental Findings Committee II on Vascular Findings. J Am Coll Radiol 2013; 10:789-794. 4. Morphologic changes in the liver indicative of underlying cirrhosis. 5. Colonic diverticulosis without evidence of acute diverticulitis at this time. 6. Additional incidental findings, as above. Electronically Signed   By: Vinnie Langton M.D.   On: 04/06/2020 08:00   CT Angio Abd/Pel w/ and/or w/o  Result Date: 04/06/2020 CLINICAL DATA:  80 year old male with history of severe aortic stenosis. Preprocedural  study prior to potential transcatheter aortic valve replacement (TAVR)  procedure. EXAM: CT ANGIOGRAPHY CHEST, ABDOMEN AND PELVIS TECHNIQUE: Multidetector CT imaging through the chest, abdomen and pelvis was performed using the standard protocol during bolus administration of intravenous contrast. Multiplanar reconstructed images and MIPs were obtained and reviewed to evaluate the vascular anatomy. CONTRAST:  141mL OMNIPAQUE IOHEXOL 350 MG/ML SOLN COMPARISON:  Chest CT 05/29/2018. CT the abdomen and pelvis 02/26/2017. FINDINGS: CTA CHEST FINDINGS Cardiovascular: Heart size is normal. There is no significant pericardial fluid, thickening or pericardial calcification. There is aortic atherosclerosis, as well as atherosclerosis of the great vessels of the mediastinum and the coronary arteries, including calcified atherosclerotic plaque in the left main, left anterior descending, left circumflex and right coronary arteries. Severe thickening calcification of the aortic valve. Extensive calcifications of the mitral-aortic intervalvular fibrosa also noted. Mediastinum/Lymph Nodes: No pathologically enlarged mediastinal or hilar lymph nodes. Esophagus is unremarkable in appearance. No axillary lymphadenopathy. Lungs/Pleura: No suspicious appearing pulmonary nodules or masses are noted. No acute consolidative airspace disease. No pleural effusions. Musculoskeletal/Soft Tissues: There are no aggressive appearing lytic or blastic lesions noted in the visualized portions of the skeleton. CTA ABDOMEN AND PELVIS FINDINGS Hepatobiliary: Liver has a nodular contour, concerning for underlying cirrhosis. No suspicious cystic or solid hepatic lesions. No intra or extrahepatic biliary ductal dilatation. Status post cholecystectomy. Pancreas: No pancreatic mass. No pancreatic ductal dilatation. No pancreatic or peripancreatic fluid collections or inflammatory changes. Spleen: Unremarkable. Adrenals/Urinary Tract: Right kidney and  bilateral adrenal glands are normal in appearance. Scarring in the lower pole of the left kidney. No hydroureteronephrosis. Urinary bladder is normal in appearance. Stomach/Bowel: Normal appearance of the stomach. No pathologic dilatation of small bowel or colon. Numerous colonic diverticulae are noted, particularly in the descending colon and sigmoid colon, without surrounding inflammatory changes to suggest an acute diverticulitis at this time. Normal appendix. Vascular/Lymphatic: Aortic atherosclerosis with fusiform aneurysmal dilatation of the infrarenal abdominal aorta which measures up to 5.0 x 4.2 cm in diameter. Vascular findings and measurements pertinent to potential TAVR procedure, as detailed below. No lymphadenopathy noted in the abdomen or pelvis. Reproductive: Prostate gland and seminal vesicles are unremarkable in appearance. Other: No significant volume of ascites.  No pneumoperitoneum. Musculoskeletal: Interbody cages at L4-L5. There are no aggressive appearing lytic or blastic lesions noted in the visualized portions of the skeleton. VASCULAR MEASUREMENTS PERTINENT TO TAVR: AORTA: Minimal Aortic Diameter-11 x 9 mm Severity of Aortic Calcification-severe RIGHT PELVIS: Right Common Iliac Artery - Minimal Diameter-7.2 x 5.3 mm Tortuosity-severe Calcification-moderate to severe Right External Iliac Artery - Minimal Diameter-6.5 x 6.5 mm Tortuosity-moderate Calcification-moderate Right Common Femoral Artery - Minimal Diameter-6.4 x 7.3 mm Tortuosity-mild Calcification-moderate to severe LEFT PELVIS: Left Common Iliac Artery - Minimal Diameter-7.2 x 5.9 mm Tortuosity-severe Calcification-moderate to severe Left External Iliac Artery - Minimal Diameter-6.8 x 7.0 mm Tortuosity-moderate Calcification-mild Left Common Femoral Artery - Minimal Diameter-8.6 x 8.4 mm Tortuosity-mild Calcification-moderate Review of the MIP images confirms the above findings. IMPRESSION: 1. Vascular findings and measurements  pertinent to potential TAVR procedure, as detailed above. 2. Severe thickening calcification of the aortic valve, compatible with reported clinical history of severe aortic stenosis. 3. Aortic atherosclerosis, in addition to left main and 3 vessel coronary artery disease. There is also a large infrarenal abdominal aortic aneurysm which is increasing in size, currently measuring 5.0 x 4.2 cm in diameter (previously 4.3 cm on 02/26/2017). Recommend referral to a vascular specialist. This recommendation follows ACR consensus guidelines: White Paper of the ACR Incidental Findings Committee II on Vascular Findings. J Am  Coll Radiol 2013; 10:789-794. 4. Morphologic changes in the liver indicative of underlying cirrhosis. 5. Colonic diverticulosis without evidence of acute diverticulitis at this time. 6. Additional incidental findings, as above. Electronically Signed   By: Vinnie Langton M.D.   On: 04/06/2020 08:00   Disposition   Pt is being discharged home today in good condition.  Follow-up Plans & Appointments     Follow-up Information    Eileen Stanford, PA-C. Go on 05/11/2020.   Specialties: Cardiology, Radiology Why: @ 2:30pm, please arrive at least 10 minutes early.  Contact information: [address] [place] [address] [phone]              Discharge Instructions    Amb Referral to Cardiac Rehabilitation   Complete by: As directed    Diagnosis:  Coronary Stents PTCA     After initial evaluation and assessments completed: Virtual Based Care may be provided alone or in conjunction with Phase 2 Cardiac Rehab based on patient barriers.: Yes      Discharge Medications   Allergies as of 04/28/2020      Reactions   Celebrex [celecoxib] Hives, Itching      Medication List    TAKE these medications   amLODipine 10 MG tablet Commonly known as: NORVASC Take 1 tablet (10 mg total) by mouth daily.   aspirin 81 MG EC tablet Take 81 mg by mouth at  bedtime.   atorvastatin 80 MG tablet Commonly known as: LIPITOR Take 1 tablet (80 mg total) by mouth daily.   carvedilol 6.25 MG tablet Commonly known as: Coreg Take 1 tablet (6.25 mg total) by mouth 2 (two) times daily.   citalopram 20 MG tablet Commonly known as: CeleXA Take 1 tablet (20 mg total) by mouth daily.   clopidogrel 75 MG tablet Commonly known as: PLAVIX Take 1 tablet (75 mg total) by mouth daily.   fluticasone 50 MCG/ACT nasal spray Commonly known as: FLONASE Place 2 sprays into both nostrils daily as needed for rhinitis or allergies.   gabapentin 300 MG capsule Commonly known as: NEURONTIN TAKE 1 CAPSULE(300 MG) BY MOUTH THREE TIMES DAILY   isosorbide mononitrate 30 MG 24 hr tablet Commonly known as: IMDUR TAKE 1 TABLET(30 MG) BY MOUTH AT BEDTIME   lisinopril 10 MG tablet Commonly known as: ZESTRIL TAKE 1 TABLET(10 MG) BY MOUTH DAILY   nicotine 21 mg/24hr patch Commonly known as: NICODERM CQ - dosed in mg/24 hours Place 21 mg onto the skin daily.   oxyCODONE 5 MG immediate release tablet Commonly known as: Oxy IR/ROXICODONE Take 1-2 tablets (5-10 mg total) by mouth every 6 (six) hours as needed for severe pain (to take 2 tabs ONLY when severe pain- which is not usual for pt).   pantoprazole 40 MG tablet Commonly known as: PROTONIX Take 1 tablet (40 mg total) by mouth daily.   predniSONE 1 MG tablet Commonly known as: DELTASONE Take 1 tablet by mouth daily with the 5 mg tablet (total 6 mg daily).   predniSONE 5 MG tablet Commonly known as: DELTASONE Take 1 tablet (5 mg total) by mouth daily with breakfast.   PROTEIN SUPPLEMENT 80% PO Take 11 oz by mouth daily. Premier Protein chocolate   tamsulosin 0.4 MG Caps capsule Commonly known as: FLOMAX Take 0.8 mg by mouth daily.   traMADol 50 MG tablet Commonly known as: Ultram Take 2 tablets (100 mg total) by mouth 2 (two) times daily.  Outstanding Labs/Studies   none  Duration  of Discharge Encounter   Greater than 30 minutes including physician time.  Mable Fill, PA-C 04/28/2020, 10:57 AM [phone]  Patient seen, examined. Available data reviewed. Agree with findings, assessment, and plan as outlined by Nell Range, PA-C.  Patient seen yesterday morning (morning of discharge).  He is doing very well.  Exam reveals an alert, oriented male in no distress.  Lungs are clear, heart is regular rate and rhythm with a 3/6 late peaking systolic murmur at the right upper sternal border, abdomen is soft and nontender, extremities have no edema.  Right radial site is clear with no hematoma.  He has done very well with PCI using atherectomy and stenting of the LAD.  We discussed postprocedural restrictions.  He will return for planned TAVR next month after his daughter's wedding.  Medically stable for discharge.  Sherren Mocha, M.D. 04/29/2020 12:48 PM

## 2020-04-29 ENCOUNTER — Encounter: Payer: Medicare Other | Admitting: Vascular Surgery

## 2020-05-04 LAB — TOXASSURE SELECT,+ANTIDEPR,UR

## 2020-05-06 NOTE — Progress Notes (Signed)
HEART AND [place]                                     Cardiology Office Note:    Date:  05/11/2020   ID:  Brandon [name], DOB 1940-08-15, MRN [id number]  PCP:  Haydee Salter, MD  [facility] Cardiologist:  Loralie Champagne, MD  [facility] Electrophysiologist:  None   Referring MD: Haydee Salter, MD   Follow up s/p PCI, follow up AS  History of Present Illness:    Brandon [name] is a 80 y.o. 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    The patient has been followed for aortic stenosis with surveillance echo studies and close clinical care by Dr. Aundra Dubin. He has developed progressive and now severe aortic stenosis with mean gradient of 50 mmHg and calculated valve area of 0.96 cm. LVEF remains preserved at 60 to 65%. He underwent cardiac catheterization for preoperative assessment and was demonstrated to have chronic occlusion of the right coronary artery with left-to-right collaterals, severe calcific proximal LAD stenosis, and moderately severe long segment mid circumflex stenosis. The patient reported exertional chest pain and dyspnea. He was referred to the multidisciplinary valve team for consideration of SAVR+CABG or TAVR+PCI. Cardiac gated CTA of the heart revealed anatomical characteristics consistent with aortic stenosis suitable for treatment by transcatheter aortic valve replacement without any significant complicating features. CTA of the aorta and iliac vessels demonstrated a large infrarenal abdominal aortic aneurysm which is increasing in size,  currently measuring 5.0x 4.2 cm in diameter (previously 4.3 cm on 02/26/2017). He has been set up for vascular consultation with Dr. Donzetta Matters on 4/8. He has been seen by Dr. Burt Knack and Dr. Cyndia Bent who felt PCI+ TAVR via the subclavian approach was the most appropriate course of action for the patient.   He underwent successful orbital atherectomy and stenting of severe calcific stenosis in the proximal LAD, procedure guided by OCT imaging, treated with a 4.0 x 12 mm resolute Onyx DES. There was moderate left circumflex stenosis with negative pressure wire analysis. He was continued on uninterrupted DAPT with aspirin and Plavix x 6 months.  Today he presents to clinic for follow up. Here alone. Doing well since PCI. No chest pain. Feels like he has a bit more stamina since PCI. He has had continued shortness of breath and some fatigue that he attributes to his AS. No LE edema, orthopnea or PND. He occasionally gets positional dizziness, mostly when getting up from sitting. No syncope. No blood in stool or urine. He is excited for his daughters wedding coming up in [facility] [place].    Past Medical History:  Diagnosis Date  . Aortic stenosis    mild echocardiogram 8/09 EF 60%, showed no regional wall motion abnml, mild LVH, mod focal basal septal hypertrophy and mild dyastolic dysfunction. 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 mean gradient across aortic valve was 8 mmHG. also mild L atrial enlargement and normal RV size and function.  minimal AS on LHC in 7/10.   . Arthritis    "all over" (07/19/2016)  . BPH (benign prostatic hypertrophy)   . CAD (coronary artery disease)    LCH (7/10) totally occluded proximal RCA with very robuse L to R collaterals, 50% proximal LAD stenosis, EF 65%, medical management.    . CHF (congestive heart failure) ([place])   . Chicken pox   . Chronic lower back pain    s/p surgical fusion  . Diverticulosis   . Esophageal stricture   . GERD  (gastroesophageal reflux disease)   . Heart murmur   . Hepatitis B 1984  . Hiatal hernia   . History of radiation therapy 01/16/18- 03/05/18   Left Tonsil, 66 Gy in 33 fractions to high risk nodal echelons.   Marland Kitchen HLD (hyperlipidemia)   . HTN (hypertension)   . 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 Osteoarthritis   . TIA (transient ischemic attack) 1990s   hx  . tonsillar ca dx'd 11/2017  . Tubular adenoma of colon 2009    Past Surgical History:  Procedure Laterality Date  . BACK SURGERY    . CATARACT EXTRACTION W/ INTRAOCULAR LENS  IMPLANT, BILATERAL Bilateral 01/2012 - 02/2012  . CORONARY ATHERECTOMY N/A 04/27/2020   Procedure: CORONARY ATHERECTOMY;  Surgeon: Sherren Mocha, MD;  Location: [place] [facility];  Service: Cardiovascular;  Laterality: N/A;  . CORONARY STENT INTERVENTION N/A 04/27/2020   Procedure: CORONARY STENT INTERVENTION;  Surgeon: Sherren Mocha, MD;  Location: [place] [facility];  Service: Cardiovascular;  Laterality: N/A;  . DIRECT LARYNGOSCOPY Left 10/09/2017   Procedure: DIRECT LARYNGOSCOPY WITH BOPSY;  Surgeon: Jodi Marble, MD;  Location: [place];  Service: ENT;  Laterality: Left;  . ESOPHAGOGASTRODUODENOSCOPY (EGD) WITH ESOPHAGEAL DILATION     "couple times" (07/19/2016)  . ESOPHAGOSCOPY Left 10/09/2017   Procedure: ESOPHAGOSCOPY;  Surgeon: Jodi Marble, MD;  Location: [place];  Service: ENT;  Laterality: Left;  . INTRAVASCULAR IMAGING/OCT N/A 04/27/2020   Procedure: INTRAVASCULAR IMAGING/OCT;  Surgeon: Sherren Mocha, MD;  Location: [place] [facility];  Service: Cardiovascular;  Laterality: N/A;  . INTRAVASCULAR PRESSURE WIRE/FFR STUDY N/A 04/27/2020   Procedure: INTRAVASCULAR PRESSURE WIRE/FFR STUDY;  Surgeon: Sherren Mocha, MD;  Location: [place] [facility];  Service: Cardiovascular;  Laterality: N/A;  . IR GASTROSTOMY TUBE MOD SED  01/08/2018  . IR THORACENTESIS ASP PLEURAL SPACE W/IMG GUIDE  07/19/2016  . LAPAROSCOPIC  CHOLECYSTECTOMY  1994  . LUMBAR DISC SURGERY  05/1996   L4-5; Dr. Sherwood Gambler   . LUMBAR LAMINECTOMY/DECOMPRESSION MICRODISCECTOMY  10/2002   L3-4. Dr. Sherwood Gambler  . MULTIPLE TOOTH EXTRACTIONS  1980s  . PARTIAL GLOSSECTOMY  12/02/2017   Dr. Nicolette [facility]- [facility]  . pharyngoplasty for closure of tingue base defect  12/02/2017   Dr. Nicolette Bang- [facility]  . PICC LINE INSERTION  07/15/2016  . POSTERIOR LUMBAR FUSION  09/1996   Ray cage, L4-5 Dr. Rita Ohara  . PROSTATE BIOPSY  ~ 2017  . radical tonsillectomy Left 12/02/2017   [facility]  . RIGHT HEART CATH AND CORONARY ANGIOGRAPHY N/A 03/31/2020   Procedure: RIGHT HEART CATH AND CORONARY ANGIOGRAPHY;  Surgeon: Larey Dresser, MD;  Location: [place] [facility];  Service: Cardiovascular;  Laterality: N/A;  . RIGID BRONCHOSCOPY Left 10/09/2017   Procedure: RIGID BRONCHOSCOPY;  Surgeon: Jodi Marble, MD;  Location: [place];  Service: ENT;  Laterality: Left;  . [facility]    Current  Medications: Current Meds  Medication Sig  . amLODipine (NORVASC) 10 MG tablet Take 1 tablet (10 mg total) by mouth daily.  Marland Kitchen aspirin 81 MG EC tablet Take 81 mg by mouth at bedtime.  Marland Kitchen atorvastatin (LIPITOR) 80 MG tablet Take 1 tablet (80 mg total) by mouth daily.  . carvedilol (COREG) 6.25 MG tablet Take 1 tablet (6.25 mg total) by mouth 2 (two) times daily.  . citalopram (CELEXA) 20 MG tablet Take 1 tablet (20 mg total) by mouth daily.  . clopidogrel (PLAVIX) 75 MG tablet Take 1 tablet (75 mg total) by mouth daily.  . fluticasone (FLONASE) 50 MCG/ACT nasal spray Place 2 sprays into both nostrils daily as needed for rhinitis or allergies.  Marland Kitchen gabapentin (NEURONTIN) 300 MG capsule TAKE 1 CAPSULE(300 MG) BY MOUTH THREE TIMES DAILY  . isosorbide mononitrate (IMDUR) 30 MG 24 hr tablet TAKE 1 TABLET(30 MG) BY MOUTH AT BEDTIME  . lisinopril (ZESTRIL) 10 MG tablet TAKE 1 TABLET(10 MG) BY MOUTH DAILY  . nicotine (NICODERM CQ  - DOSED IN MG/24 HOURS) 21 mg/24hr patch Place 21 mg onto the skin daily.  . Nutritional Supplements (PROTEIN SUPPLEMENT 80% PO) Take 11 oz by mouth daily. Premier Protein chocolate  . oxyCODONE (OXY IR/ROXICODONE) 5 MG immediate release tablet Take 1-2 tablets (5-10 mg total) by mouth every 6 (six) hours as needed for severe pain (to take 2 tabs ONLY when severe pain- which is not usual for pt).  . pantoprazole (PROTONIX) 40 MG tablet Take 1 tablet (40 mg total) by mouth daily.  . predniSONE (DELTASONE) 1 MG tablet Take 1 tablet by mouth daily with the 5 mg tablet (total 6 mg daily).  . predniSONE (DELTASONE) 5 MG tablet Take 1 tablet (5 mg total) by mouth daily with breakfast.  . tamsulosin (FLOMAX) 0.4 MG CAPS capsule Take 0.8 mg by mouth daily.  . traMADol (ULTRAM) 50 MG tablet Take 2 tablets (100 mg total) by mouth 2 (two) times daily.  . [DISCONTINUED] metoprolol tartrate (LOPRESSOR) 25 MG tablet Take 25 mg by mouth daily.     Allergies:   Celebrex [celecoxib]   Social History   Socioeconomic History  . Marital status: Married    Spouse name: etta  . Number of children: 2  . Years of education: Not on file  . Highest education level: Not on file  Occupational History  . Occupation: retired    Fish farm manager: RETIRED    Comment: disabled due to back problems  Tobacco Use  . Smoking status: Former Smoker    Packs/day: 1.50    Years: 50.00    Pack years: 75.00    Types: Cigarettes    Quit date: 09/05/2017    Years since quitting: 2.6  . Smokeless tobacco: Never Used  . Tobacco comment: smoked less than 1 ppd for 40+ years; Using Vapor Cig  Vaping Use  . Vaping Use: Never used  Substance and Sexual Activity  . Alcohol use: Yes    Alcohol/week: 0.0 standard drinks    Comment: Occasionally,not a heavy drinker   . Drug use: Yes    Types: Oxycodone  . Sexual activity: Never  Other Topics Concern  . Not on file  Social History Narrative   ** Merged History Encounter **        Married (3rd), [place]. 2 children from 1st marriage, 4 step children.    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       Cell # [phone]   Social Determinants of Health   Financial Resource Strain: Low Risk   . Difficulty of Paying Living Expenses: Not hard at all  Food Insecurity: No Food Insecurity  . Worried About Charity fundraiser in the Last Year: Never true  . Ran Out of Food in the Last Year: Never true  Transportation Needs: No Transportation Needs  . Lack of Transportation (Medical): No  . Lack of Transportation (Non-Medical): No  Physical Activity: Inactive  . Days of Exercise per Week: 0 days  . Minutes of Exercise per Session: 0 min  Stress: No Stress Concern Present  . Feeling of Stress : Not at all  Social Connections: Moderately Isolated  . Frequency of Communication with Friends and Family: More than three times a week  . Frequency of Social Gatherings with Friends and Family: Once a week  . Attends Religious Services: Never  . Active Member of Clubs or Organizations: No  . Attends Archivist Meetings: Never  . Marital Status: Married     Family History: The patient's family history includes Alzheimer's disease (age of onset: 71) in his mother; Aneurysm in his brother; Arthritis in his mother; Brain cancer in his maternal aunt; Coronary artery disease in his father; Heart disease (age of onset: 26) in his father; Obesity in his daughter; Stomach cancer in his maternal uncle.  ROS:   Please see the history of present illness.    All other systems reviewed and are negative.  EKGs/Labs/Other Studies Reviewed:    The following studies were reviewed today:  Cath 04/27/20 INTRAVASCULAR IMAGING/OCT  CORONARY STENT INTERVENTION  CORONARY ATHERECTOMY  INTRAVASCULAR PRESSURE WIRE/FFR STUDY  Conclusion  1.  Successful orbital atherectomy and stenting of severe calcific  stenosis in the proximal LAD, procedure guided by OCT imaging, treated with a 4.0 x 12 mm resolute Onyx DES 2.  Moderate left circumflex stenosis with negative pressure wire analysis.  Plan: Overnight observation, likely discharge home tomorrow as long as no complications arise.  Continue antiplatelet therapy with aspirin and clopidogrel minimum 6 months, plan to proceed with TAVR in staged fashion.  Recommendations  Antiplatelet/Anticoag Recommend uninterrupted dual antiplatelet therapy with Aspirin 81mg  daily and Clopidogrel 75mg  daily for a minimum of 6 months (stable ischemic heart disease-Class I recommendation).     EKG:  EKG is NOT ordered today.    Recent Labs: 01/11/2020: ALT 6 03/25/2020: TSH 1.569 04/28/2020: BUN 7; Creatinine, Ser 0.65; Hemoglobin 10.8; Platelets 123; Potassium 3.2; Sodium 140  Recent Lipid Panel    Component Value Date/Time   CHOL 166 03/24/2020 1607   TRIG 76 03/24/2020 1607   HDL 53 03/24/2020 1607   CHOLHDL 3.1 03/24/2020 1607   VLDL 15 03/24/2020 1607   LDLCALC 98 03/24/2020 1607   LDLDIRECT 81.2 05/25/2008 0906     Risk Assessment/Calculations:       Physical Exam:    VS:  BP 118/62   Pulse 75   Ht 5' 7.5" (1.715 m)   Wt 172 lb 3.2 oz (78.1 kg)   SpO2 96%   BMI 26.57 kg/m     Wt Readings from Last 3 Encounters:  05/11/20 172 lb 3.2 oz (78.1 kg)  04/28/20 169 lb 1.6 oz (76.7 kg)  04/25/20 172 lb (78 kg)     GEN: Well nourished, well developed in no acute distress HEENT: Normal NECK: No JVD; No carotid bruits LYMPHATICS: No lymphadenopathy CARDIAC:  RRR, 3/6 SEM @ RUSB. No rubs, gallops RESPIRATORY: rhonchous breath sounds  ABDOMEN: Soft, non-tender, non-distended MUSCULOSKELETAL:  No edema; No deformity  SKIN: Warm and dry NEUROLOGIC:  Alert and oriented x 3 PSYCHIATRIC:  Normal affect   ASSESSMENT:    1. Coronary artery disease involving native coronary artery of native heart without angina pectoris   2. Aneurysm of  infrarenal abdominal aorta (HCC)   3. Severe aortic stenosis   4. Essential hypertension    PLAN:    In order of problems listed above:  CAD with angina: he underwent successful orbital atherectomy and stenting of severe calcific stenosis in the proximal LAD, procedure guided by OCT imaging, treated with a 4.0 x 12 mm resolute Onyx DES. There was moderate left circumflex stenosis with negative pressure wire analysis. Radial site healing well. He will be continued on uninterrupted DAPT with aspirin and Plavix x 6 months. Continue statin, BB and nitrates.   AAA: pre TAVR CT showed a large infrarenal abdominal aortic aneurysm which is increasing in size, currently measuring 5.0x 4.2 cm in diameter (previously 4.3 cm on 02/26/2017). He has been set up for vascular consultation with Dr. Donzetta Matters on 4/8  Severe AS: he is being closely followed by the structural heart team and we will plan for staged TAVR via the subclavian approach (given AAA) in May.  HTN: BP well controlled. No changes made.    Medication Adjustments/Labs and Tests Ordered: Current medicines are reviewed at length with the patient today.  Concerns regarding medicines are outlined above.  No orders of the defined types were placed in this encounter.  No orders of the defined types were placed in this encounter.   Patient Instructions  Medication Instructions:  Your physician recommends that you continue on your current medications as directed. Please refer to the Current Medication list given to you today.  *If you need a refill on your cardiac medications before your next appointment, please call your pharmacy*  Lab Work: If you have labs (blood work) drawn today and your tests are completely normal, you will receive your results only by: Marland Kitchen MyChart Message (if you have MyChart) OR . A paper copy in the mail If you have any lab test that is abnormal or we need to change your treatment, we will call you to review the  results.  Follow-Up: At [facility], you and your health needs are our priority.  As part of our continuing mission to provide you with exceptional heart care, we have created designated Provider Care Teams.  These Care Teams include your primary Cardiologist (physician) and Advanced Practice Providers (APPs -  Physician Assistants and Nurse Practitioners) who all work together to provide you with the care you need, when you need it.  We recommend signing up for the patient portal called "MyChart".  Sign up information is provided on this After Visit Summary.  MyChart is used to connect with patients for Virtual Visits (Telemedicine).  Patients are able to view lab/test results, encounter notes, upcoming appointments, etc.  Non-urgent messages can be sent to your provider as well.   To learn more about what you can do with MyChart, go to [email or web address].    Your next appointment:   To be determined, someone will call you.      Signed, Angelena Form, PA-C  05/11/2020 4:56 PM    [facility]

## 2020-05-10 ENCOUNTER — Telehealth: Payer: Self-pay | Admitting: *Deleted

## 2020-05-10 DIAGNOSIS — C108 Malignant neoplasm of overlapping sites of oropharynx: Secondary | ICD-10-CM | POA: Diagnosis not present

## 2020-05-10 DIAGNOSIS — Z9889 Other specified postprocedural states: Secondary | ICD-10-CM | POA: Diagnosis not present

## 2020-05-10 DIAGNOSIS — Z87891 Personal history of nicotine dependence: Secondary | ICD-10-CM | POA: Diagnosis not present

## 2020-05-10 DIAGNOSIS — Z923 Personal history of irradiation: Secondary | ICD-10-CM | POA: Diagnosis not present

## 2020-05-10 DIAGNOSIS — Z85810 Personal history of malignant neoplasm of tongue: Secondary | ICD-10-CM | POA: Diagnosis not present

## 2020-05-10 DIAGNOSIS — Z08 Encounter for follow-up examination after completed treatment for malignant neoplasm: Secondary | ICD-10-CM | POA: Diagnosis not present

## 2020-05-10 NOTE — Telephone Encounter (Signed)
Urine drug screen for this encounter is consistent for prescribed medication, however, it is also positive for alcohol.

## 2020-05-11 ENCOUNTER — Other Ambulatory Visit: Payer: Self-pay

## 2020-05-11 ENCOUNTER — Ambulatory Visit (INDEPENDENT_AMBULATORY_CARE_PROVIDER_SITE_OTHER): Payer: Medicare Other | Admitting: Physician Assistant

## 2020-05-11 ENCOUNTER — Ambulatory Visit: Payer: Medicare Other | Admitting: Physician Assistant

## 2020-05-11 ENCOUNTER — Encounter: Payer: Self-pay | Admitting: Physician Assistant

## 2020-05-11 VITALS — BP 118/62 | HR 75 | Ht 67.5 in | Wt 172.2 lb

## 2020-05-11 DIAGNOSIS — I350 Nonrheumatic aortic (valve) stenosis: Secondary | ICD-10-CM

## 2020-05-11 DIAGNOSIS — I714 Abdominal aortic aneurysm, without rupture: Secondary | ICD-10-CM

## 2020-05-11 DIAGNOSIS — I10 Essential (primary) hypertension: Secondary | ICD-10-CM

## 2020-05-11 DIAGNOSIS — I2510 Atherosclerotic heart disease of native coronary artery without angina pectoris: Secondary | ICD-10-CM

## 2020-05-11 DIAGNOSIS — I7143 Infrarenal abdominal aortic aneurysm, without rupture: Secondary | ICD-10-CM

## 2020-05-11 NOTE — Patient Instructions (Signed)
Medication Instructions:  Your physician recommends that you continue on your current medications as directed. Please refer to the Current Medication list given to you today.  *If you need a refill on your cardiac medications before your next appointment, please call your pharmacy*  Lab Work: If you have labs (blood work) drawn today and your tests are completely normal, you will receive your results only by: Marland Kitchen MyChart Message (if you have MyChart) OR . A paper copy in the mail If you have any lab test that is abnormal or we need to change your treatment, we will call you to review the results.  Follow-Up: At [facility], you and your health needs are our priority.  As part of our continuing mission to provide you with exceptional heart care, we have created designated Provider Care Teams.  These Care Teams include your primary Cardiologist (physician) and Advanced Practice Providers (APPs -  Physician Assistants and Nurse Practitioners) who all work together to provide you with the care you need, when you need it.  We recommend signing up for the patient portal called "MyChart".  Sign up information is provided on this After Visit Summary.  MyChart is used to connect with patients for Virtual Visits (Telemedicine).  Patients are able to view lab/test results, encounter notes, upcoming appointments, etc.  Non-urgent messages can be sent to your provider as well.   To learn more about what you can do with MyChart, go to [email or web address].    Your next appointment:   To be determined, someone will call you.

## 2020-05-12 ENCOUNTER — Other Ambulatory Visit: Payer: Self-pay

## 2020-05-12 MED ORDER — PREDNISONE 5 MG PO TABS: 5.0000 mg | ORAL_TABLET | Freq: Every day | ORAL | 0 refills | Status: DC

## 2020-05-12 NOTE — Telephone Encounter (Signed)
Next Visit: 06/27/2020  Last Visit: 02/10/2020  Last Fill: 04/11/2020  Dx:  Polymyalgia rheumatica   Current Dose per office note on 02/10/2020,  predniSONE (DELTASONE) 5 MG tablet     Sig: Take 2 tablets (10 mg total) by mouth daily with breakfast. Until 03/12/2020. Then taper by 1 mg monthly.     Okay to refill prednisone?   Prednisone 5 mg tablets to be sent to [facility] at [facility].  Patient states he will decrease from 6 mg to 5 mg starting today.  Patient states Dr. Estanislado Pandy wanted him to inform the office if he noticed any changes since decreasing his medication.  Patient states he has had increased shoulder pain, as well as muscle pain in his upper body. Please advise.

## 2020-05-12 NOTE — Telephone Encounter (Signed)
Patient called requesting prescription refill of Prednisone 5 mg tablets to be sent to [facility] at [facility].  Patient states he will decrease from 6 mg to 5 mg starting today.  Patient states Dr. Estanislado Pandy wanted him to inform the office if he noticed any changes since decreasing his medication.  Patient states he has had increased shoulder pain, as well as muscle pain in his upper body.

## 2020-05-13 ENCOUNTER — Ambulatory Visit: Payer: Medicare Other | Admitting: Vascular Surgery

## 2020-05-13 ENCOUNTER — Other Ambulatory Visit: Payer: Self-pay

## 2020-05-13 ENCOUNTER — Encounter: Payer: Self-pay | Admitting: Vascular Surgery

## 2020-05-13 VITALS — BP 135/65 | HR 61 | Resp 16 | Ht 67.5 in | Wt 172.0 lb

## 2020-05-13 DIAGNOSIS — I714 Abdominal aortic aneurysm, without rupture, unspecified: Secondary | ICD-10-CM

## 2020-05-13 NOTE — Progress Notes (Signed)
Patient ID: Brandon [name], male   DOB: Dec 03, 1940, 80 y.o.   MRN: [id number]  Reason for Consult: AAA (To discuss repair)   Referred by Brandon Dresser, MD  Subjective:     HPI:  Brandon [name] is a 80 y.o. male with known history of abdominal aortic aneurysm.  He states he has known about this for 40 years.  He has a brother with aneurysm.  He is a former smoker.  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  He is scheduled for TAVR.  He states that he has shortness of breath related to his valve.  He has no new back or abdominal pain.  He walks without limitation other than shortness of breath.  He has no complaints related to today's visit.  Past Medical History:  Diagnosis Date  . Aortic stenosis    mild echocardiogram 8/09 EF 60%, showed no regional wall motion abnml, mild LVH, mod focal basal septal hypertrophy and mild dyastolic dysfunction. 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 mean gradient across aortic valve was 8 mmHG. also mild L atrial enlargement and normal RV size and function. minimal AS on LHC in 7/10.   . Arthritis    "all over" (07/19/2016)  . BPH (benign prostatic hypertrophy)   . CAD (coronary artery disease)    LCH (7/10) totally occluded proximal RCA with very robuse L to R collaterals, 50% proximal LAD stenosis, EF 65%, medical management.    . CHF (congestive heart failure) ([place])   . Chicken pox   . Chronic lower back pain    s/p surgical fusion  . Diverticulosis   . Esophageal stricture   . GERD (gastroesophageal reflux disease)   . Heart murmur   . Hepatitis B 1984  . Hiatal hernia   . History of radiation therapy 01/16/18- 03/05/18   Left Tonsil, 66 Gy in 33 fractions to high risk nodal echelons.   Marland Kitchen HLD (hyperlipidemia)   . HTN (hypertension)   . 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 Osteoarthritis   . TIA (transient ischemic attack) 1990s   hx  . tonsillar ca dx'd 11/2017  .  Tubular adenoma of colon 2009   Family History  Problem Relation Age of Onset  . Heart disease Father 81       Living  . Coronary artery disease Father        CABG  . Alzheimer's disease Mother 28       Deceased  . Arthritis Mother   . Aneurysm Brother   . Stomach cancer Maternal Uncle   . Brain cancer Maternal Aunt        x2  . Obesity Daughter        Had Bypass Sx   Past Surgical History:  Procedure Laterality Date  . BACK SURGERY    . CATARACT EXTRACTION W/ INTRAOCULAR LENS  IMPLANT, BILATERAL Bilateral 01/2012 - 02/2012  . CORONARY ATHERECTOMY N/A 04/27/2020   Procedure: CORONARY ATHERECTOMY;  Surgeon: Sherren Mocha, MD;  Location: [place] [facility];  Service: Cardiovascular;  Laterality: N/A;  . CORONARY STENT INTERVENTION N/A 04/27/2020   Procedure: CORONARY STENT INTERVENTION;  Surgeon: Sherren Mocha, MD;  Location: [place] [facility];  Service: Cardiovascular;  Laterality: N/A;  . DIRECT LARYNGOSCOPY Left 10/09/2017   Procedure: DIRECT LARYNGOSCOPY WITH BOPSY;  Surgeon: Brandon Marble, MD;  Location: [place];  Service: ENT;  Laterality: Left;  .  ESOPHAGOGASTRODUODENOSCOPY (EGD) WITH ESOPHAGEAL DILATION     "couple times" (07/19/2016)  . ESOPHAGOSCOPY Left 10/09/2017   Procedure: ESOPHAGOSCOPY;  Surgeon: Brandon Marble, MD;  Location: [place];  Service: ENT;  Laterality: Left;  . INTRAVASCULAR IMAGING/OCT N/A 04/27/2020   Procedure: INTRAVASCULAR IMAGING/OCT;  Surgeon: Sherren Mocha, MD;  Location: [place] [facility];  Service: Cardiovascular;  Laterality: N/A;  . INTRAVASCULAR PRESSURE WIRE/FFR STUDY N/A 04/27/2020   Procedure: INTRAVASCULAR PRESSURE WIRE/FFR STUDY;  Surgeon: Sherren Mocha, MD;  Location: [place] [facility];  Service: Cardiovascular;  Laterality: N/A;  . IR GASTROSTOMY TUBE MOD SED  01/08/2018  . IR THORACENTESIS ASP PLEURAL SPACE W/IMG GUIDE  07/19/2016  . LAPAROSCOPIC CHOLECYSTECTOMY  1994  . LUMBAR DISC SURGERY   05/1996   L4-5; Dr. Sherwood [name]   . LUMBAR LAMINECTOMY/DECOMPRESSION MICRODISCECTOMY  10/2002   L3-4. Dr. Sherwood [name]  . MULTIPLE TOOTH EXTRACTIONS  1980s  . PARTIAL GLOSSECTOMY  12/02/2017   Dr. Nicolette [name]- [facility]  . pharyngoplasty for closure of tingue base defect  12/02/2017   Dr. Nicolette [name]- [facility]  . PICC LINE INSERTION  07/15/2016  . POSTERIOR LUMBAR FUSION  09/1996   Ray cage, L4-5 Dr. Rita [name]  . PROSTATE BIOPSY  ~ 2017  . radical tonsillectomy Left 12/02/2017   Dr. [facility]icolette [name] at [facility]  . RIGHT HEART CATH AND CORONARY ANGIOGRAPHY N/A 03/31/2020   Procedure: RIGHT HEART CATH AND CORONARY ANGIOGRAPHY;  Surgeon: Brandon Dresser, MD;  Location: [place] [facility];  Service: Cardiovascular;  Laterality: N/A;  . RIGID BRONCHOSCOPY Left 10/09/2017   Procedure: RIGID BRONCHOSCOPY;  Surgeon: Brandon Marble, MD;  Location: [place];  Service: ENT;  Laterality: Left;  . TRACHEOSTOMY  12/02/2017   [facility]    Short Social History:  Social History   Tobacco Use  . Smoking status: Former Smoker    Packs/day: 1.50    Years: 50.00    Pack years: 75.00    Types: Cigarettes    Quit date: 09/05/2017    Years since quitting: 2.6  . Smokeless tobacco: Never Used  . Tobacco comment: smoked less than 1 ppd for 40+ years; Using Vapor Cig  Substance Use Topics  . Alcohol use: Yes    Alcohol/week: 0.0 standard drinks    Comment: Occasionally,not a heavy drinker     Allergies  Allergen Reactions  . Celebrex [Celecoxib] Hives and Itching    Current Outpatient Medications  Medication Sig Dispense Refill  . amLODipine (NORVASC) 10 MG tablet Take 1 tablet (10 mg total) by mouth daily. 90 tablet 3  . aspirin 81 MG EC tablet Take 81 mg by mouth at bedtime.    Marland Kitchen atorvastatin (LIPITOR) 80 MG tablet Take 1 tablet (80 mg total) by mouth daily. 90 tablet 3  . carvedilol (COREG) 6.25 MG tablet Take 1 tablet (6.25 mg total) by mouth 2 (two) times daily. 60 tablet 11  . citalopram (CELEXA)  20 MG tablet Take 1 tablet (20 mg total) by mouth daily. 30 tablet 5  . clopidogrel (PLAVIX) 75 MG tablet Take 1 tablet (75 mg total) by mouth daily. 90 tablet 3  . fluticasone (FLONASE) 50 MCG/ACT nasal spray Place 2 sprays into both nostrils daily as needed for rhinitis or allergies.    Marland Kitchen gabapentin (NEURONTIN) 300 MG capsule TAKE 1 CAPSULE(300 MG) BY MOUTH THREE TIMES DAILY 90 capsule 5  . isosorbide mononitrate (IMDUR) 30 MG 24 hr tablet TAKE 1 TABLET(30 MG) BY MOUTH AT BEDTIME 90 tablet  1  . lisinopril (ZESTRIL) 10 MG tablet TAKE 1 TABLET(10 MG) BY MOUTH DAILY 90 tablet 3  . nicotine (NICODERM CQ - DOSED IN MG/24 HOURS) 21 mg/24hr patch Place 21 mg onto the skin daily.    . Nutritional Supplements (PROTEIN SUPPLEMENT 80% PO) Take 11 oz by mouth daily. Premier Protein chocolate    . oxyCODONE (OXY IR/ROXICODONE) 5 MG immediate release tablet Take 1-2 tablets (5-10 mg total) by mouth every 6 (six) hours as needed for severe pain (to take 2 tabs ONLY when severe pain- which is not usual for pt). 120 tablet 0  . pantoprazole (PROTONIX) 40 MG tablet Take 1 tablet (40 mg total) by mouth daily. 90 tablet 3  . predniSONE (DELTASONE) 1 MG tablet Take 1 tablet by mouth daily with the 5 mg tablet (total 6 mg daily). 30 tablet 0  . predniSONE (DELTASONE) 5 MG tablet Take 1 tablet (5 mg total) by mouth daily with breakfast. 30 tablet 0  . tamsulosin (FLOMAX) 0.4 MG CAPS capsule Take 0.8 mg by mouth daily.    . traMADol (ULTRAM) 50 MG tablet Take 2 tablets (100 mg total) by mouth 2 (two) times daily. 120 tablet 5   No current facility-administered medications for this visit.    Review of Systems  Constitutional:  Constitutional negative. HENT: HENT negative.  Eyes: Eyes negative.  Respiratory: Positive for shortness of breath.  Cardiovascular: Cardiovascular negative.  GI: Gastrointestinal negative.  Musculoskeletal: Musculoskeletal negative.  Skin: Skin negative.  Neurological: Neurological  negative. Hematologic: Hematologic/lymphatic negative.  Psychiatric: Psychiatric negative.        Objective:  Objective   Vitals:   05/13/20 0835  BP: 135/65  Pulse: 61  Resp: 16  SpO2: 97%  Weight: 172 lb (78 kg)  Height: 5' 7.5" (1.715 m)   Body mass index is 26.54 kg/m.  Physical Exam HENT:     Head: Normocephalic.     Nose:     Comments: Wearing a mask Eyes:     Pupils: Pupils are equal, round, and reactive to light.  Cardiovascular:     Pulses:          Femoral pulses are 2+ on the right side and 2+ on the left side.      Popliteal pulses are 2+ on the right side and 2+ on the left side.       Posterior tibial pulses are 2+ on the right side and 2+ on the left side.  Pulmonary:     Effort: Pulmonary effort is normal.  Abdominal:     General: Abdomen is flat.     Palpations: Abdomen is soft. There is no mass.  Musculoskeletal:        General: Normal range of motion.     Right lower leg: No edema.     Left lower leg: No edema.  Skin:    General: Skin is warm and dry.     Capillary Refill: Capillary refill takes less than 2 seconds.  Neurological:     Mental Status: He is alert.  Psychiatric:        Mood and Affect: Mood normal.        Behavior: Behavior normal.        Thought Content: Thought content normal.        Judgment: Judgment normal.     Data: CTA IMPRESSION: 1. Vascular findings and measurements pertinent to potential TAVR procedure, as detailed above. 2. Severe thickening calcification of the aortic valve, compatible with  reported clinical history of severe aortic stenosis. 3. Aortic atherosclerosis, in addition to left main and 3 vessel coronary artery disease. There is also a large infrarenal abdominal aortic aneurysm which is increasing in size, currently measuring 5.0 x 4.2 cm in diameter (previously 4.3 cm on 02/26/2017). Recommend referral to a vascular specialist. This recommendation follows ACR consensus guidelines: White Paper of  the ACR Incidental Findings Committee II on Vascular Findings. J Am Coll Radiol 2013; 10:789-794. 4. Morphologic changes in the liver indicative of underlying cirrhosis. 5. Colonic diverticulosis without evidence of acute diverticulitis at this time. 6. Additional incidental findings, as above.     Assessment/Plan:     80 year old male with 5 cm abdominal aortic aneurysm scheduled for TAVR.  I discussed the signs and symptoms of rupture as well as the expected course of his aneurysm with possible growth half centimeter per year and his options for repair.  Currently he would be a candidate for endovascular aneurysm repair but at 5 cm with other procedures planned we will repeat his scan in 5 to 6 months and can discuss possible intervention at that time.  We have discussed avoiding lifting heavy weight that causes him to strain.  We will see him back in 5 to 6 months with repeat CT after which he will have had his TAVR.     Waynetta Sandy MD [facility]

## 2020-05-18 NOTE — Telephone Encounter (Signed)
Attempted to call pt- kept ringing- finally picked up but voicemail was full.  Will continue to reach out to pt.

## 2020-05-18 NOTE — Telephone Encounter (Signed)
PT Brandon [name] called and said he missed your call I explained to him that you were out off the office and wont get back to him today but that I was going to send you a message .

## 2020-05-19 NOTE — Telephone Encounter (Signed)
Daughter getting married Saturday.  Doing valve on May 3rd Stent is doing well. No complications.   Discussed alcohol- and explained no more than 1 drink in 24 hours/day. Pt admits has been drinking 2-3 glasses at night- explained no more than 1 drink/day.   Has gone down to 5 mg Prednisone- muscles have been more- will likely need to go back up to 6mg - PMR Sx's. .  Joints are OK.

## 2020-05-23 ENCOUNTER — Other Ambulatory Visit: Payer: Self-pay

## 2020-05-23 ENCOUNTER — Telehealth (HOSPITAL_COMMUNITY): Payer: Self-pay

## 2020-05-23 ENCOUNTER — Ambulatory Visit (HOSPITAL_COMMUNITY)
Admission: RE | Admit: 2020-05-23 | Discharge: 2020-05-23 | Disposition: A | Discharge status: Home / Self Care | Payer: Medicare Other | Source: Ambulatory Visit | Attending: Cardiology | Admitting: Cardiology

## 2020-05-23 ENCOUNTER — Encounter (HOSPITAL_COMMUNITY): Payer: Self-pay | Admitting: Cardiology

## 2020-05-23 VITALS — BP 140/80 | HR 75 | Wt 173.4 lb

## 2020-05-23 DIAGNOSIS — Z955 Presence of coronary angioplasty implant and graft: Secondary | ICD-10-CM | POA: Insufficient documentation

## 2020-05-23 DIAGNOSIS — Z7982 Long term (current) use of aspirin: Secondary | ICD-10-CM | POA: Insufficient documentation

## 2020-05-23 DIAGNOSIS — Z87891 Personal history of nicotine dependence: Secondary | ICD-10-CM | POA: Diagnosis not present

## 2020-05-23 DIAGNOSIS — I350 Nonrheumatic aortic (valve) stenosis: Secondary | ICD-10-CM

## 2020-05-23 DIAGNOSIS — I714 Abdominal aortic aneurysm, without rupture: Secondary | ICD-10-CM | POA: Insufficient documentation

## 2020-05-23 DIAGNOSIS — I10 Essential (primary) hypertension: Secondary | ICD-10-CM | POA: Insufficient documentation

## 2020-05-23 DIAGNOSIS — E7849 Other hyperlipidemia: Secondary | ICD-10-CM

## 2020-05-23 DIAGNOSIS — Z79899 Other long term (current) drug therapy: Secondary | ICD-10-CM | POA: Diagnosis not present

## 2020-05-23 DIAGNOSIS — Z7952 Long term (current) use of systemic steroids: Secondary | ICD-10-CM | POA: Diagnosis not present

## 2020-05-23 DIAGNOSIS — E785 Hyperlipidemia, unspecified: Secondary | ICD-10-CM | POA: Diagnosis not present

## 2020-05-23 DIAGNOSIS — I2510 Atherosclerotic heart disease of native coronary artery without angina pectoris: Secondary | ICD-10-CM | POA: Diagnosis not present

## 2020-05-23 LAB — LIPID PANEL
Cholesterol: 160 mg/dL (ref 0–200)
HDL: 55 mg/dL (ref >40)
LDL Cholesterol: 85 mg/dL (ref 0–99)
Total CHOL/HDL Ratio: 2.9 RATIO
Triglycerides: 98 mg/dL (ref <150)
VLDL: 20 mg/dL (ref 0–40)

## 2020-05-23 LAB — BASIC METABOLIC PANEL
Anion gap: 8 (ref 5–15)
BUN: 13 mg/dL (ref 8–23)
CO2: 32 mmol/L (ref 22–32)
Calcium: 8.9 mg/dL (ref 8.9–10.3)
Chloride: 97 mmol/L — ABNORMAL LOW (ref 98–111)
Creatinine, Ser: 0.63 mg/dL (ref 0.61–1.24)
GFR, Estimated: >60 mL/min (ref >60)
Glucose, Bld: 116 mg/dL — ABNORMAL HIGH (ref 70–99)
Potassium: 3.4 mmol/L — ABNORMAL LOW (ref 3.5–5.1)
Sodium: 137 mmol/L (ref 135–145)

## 2020-05-23 MED ORDER — EZETIMIBE 10 MG PO TABS: 10.0000 mg | ORAL_TABLET | Freq: Every day | ORAL | 11 refills | Status: DC

## 2020-05-23 NOTE — Telephone Encounter (Signed)
-----   Message from Larey Dresser, MD sent at 05/23/2020  3:06 PM EDT ----- LDL still too high, add Zetia 10 mg daily with lipids/LFTs in 2 month.

## 2020-05-23 NOTE — Progress Notes (Signed)
Patient ID: Brandon [name], male   DOB: 1941-01-29, 80 y.o.   MRN: [id number] PCP: Elyn Aquas Cardiology: Dr. Aundra Dubin  80 y.o.with history of CAD, HTN, and hyperlipidemia presents for followup.  Patient had described some exertional chest pain and myoview showed a partially reversible inferior defect, so LHC was done in 7/10.  This showed that his proximal RCA was totally occluded.  There were very robust left to right collaterals.  There was also a 50% proximal LAD stenosis.  The patient was managed medically.    Most recent echo in 5/20 showed EF 65-70% with moderate AS and mild AI.     Earlier in 2018, he developed a Strep intermedius liver abscess.  He had a hepatic drain placed and also had a right pleural effusion requiring diuresis.    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  He is now cancer-free.    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 Abdominal US in 2/22 showed stable 4.8 cm AAA.   Echo in 2/22 showed EF 60-65% with mild LVH, normal RV, PASP 45 mmHg, severe AS with mean gradient 50 mmHg and AVA 0.96 cm^2. RHC/LHC was done in preparation for AVR, this showed 95% pLAD stenosis, 60-70% pLCx, and CTO RCA with collaterals.  FFR was negative for LCx, patient had DES to LAD.   Patient returns today for followup of CAD and aortic stenosis. He has been diagnosed with polymyalgia rheumatica and is taking prednisone.  No further exertional chest pain since LAD PCI.  He still notes dyspnea with moderate to heavy exertion.  SBP in 130s when he checks at home.  He gets lightheaded at times when he stands up.    ECG: NSR, nonspecific T wave flattening (personally reviewed)  Labs (12/10): HDL 28, LDL 70, K 4.3, creatinine 0.8 Labs (4/11): K 4, creatinine 0.8, LDL 53, HDL 26, LFTs normal Labs (12/11): LDL 74, HDL 27, K 4, creatinine 0.9, LFTs normal, TSH normal Labs (7/12): LDL 87, HDL 33 Labs (9/12): LDL 66, HDL 33, LFTs normal Labs (9/13): LDL  62, HDL 30, K 4.1, creatinine 0.8 Labs (4/14): LDL 88, HDL 27, K 4.4, creatinine 0.8 Labs (4/15): K 4.4, creatinine 0.76 Labs (4/16): LDL 69, HDL 32 Labs (11/16): K 4.3, creatinine 0.76, LDL 68, HDL 31 Labs (1/18): LDL 72 Labs (6/18): K 4.2, creatinine 0.6, LFTs normal, hgb 8.1.  Labs (9/18): K 4, creatinine 0.6, LDL 79, HDL 36 Labs (1/19): K 3.9, creatinine 0.67 Labs (8/21): K 3.7, creatinine 0.78 Labs (2/22): LDL 98 Labs (3/22): K 3.2, creatinine 0.65  Allergies (verified):  1)  ! Celebrex  Past Medical History: 1. Hyperlipidemia: Myalgias with atorvastatin and Crestor. 2. Aortic stenosis.  Echocardiogram in August 2009 showed EF 60%, no regional wall motion abnormalities, mild LVH, moderate focal basal septal hypertrophy, and mild diastolic dysfunction.  There was a very mild aortic stenosis with partially fused left and right coronary cusps and some restricted motion of the aortic valve.  The mean gradient, however, across the aortic valve was only 8 mmHg.  There was also mild left atrial enlargement and normal RV size and function.  Minimal AS on LHC in 7/10.  Echo (7/14) with EF 60-65%, mild LVH, mild AS mean gradient 13 mmHg.  Echo (6/16) with EF 55-60%, mild AS with mean gradient 14 mmHg.  - Echo (6/18): EF 55-60%, mild LVH, moderate AS mean gradient 27 mmHg, mild AI.   -  Echo (5/20, [facility]): EF 65-70%, moderate AS, mild AI.  - Echo (2/22): EF 60-65% with mild LVH, normal RV, PASP 45 mmHg, severe AS with mean gradient 50 mmHg and AVA 0.96 cm^2.  3. Hypertension. 4. Gastroesophageal reflux disease: h/o esophageal stricture.  5. BPH. 6. Diverticulosis. 7. Smoked until 7/10. 8. CAD: LHC (7/10) showed totally occluded proximal RCA with very robust left to right collaterals, 50% proximal LAD stenosis, EF 65%.   - LHC (2/22): 95% stenosis proximal LAD, 60-70% proximal LCx, totally occluded RCA with collaterals.  Negative FFR of LCx, patient had DES to LAD.   9. Osteoarthritis 10. Colonic  polyps 11. h/o TIA 12. 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  This manifested with slurred speech. 13. History of low back pain.  The patient is status post surgical back fusion. 14. History of cholecystectomy. 15. Cataract surgery 16. Prostate cancer: treated 51. Liver abscess: Strep intermedius, 2018.  18. AAA: Noted in 8/18 on CT abdomen, 4.3 cm.  - Abdominal US (10/20): 4.8 cm AAA.  - Abdominal US (2/22): 4.8 cm AAA - CT abdomen 3/22 with 5 cm AAA 19. Carotid stenosis: carotid dopplers 10/18 with 40-59% RICA stenosis.  - Carotid dopplers (10/20): 50-69% RICA stenosis.   - Carotid dopplers (2/22): Mild BICA stenosis.  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   Family History: Father alive w/ CAD & CABG at [facility] in 2009, had TAVR as well.  Mother died age 53 w/ Alzheimer's disease, hx of DJD 1 Sibling- Brother alive & well w/ DJD in his hips  Social History: Married- 63rd marriage, wife= Etta 2 children from 1st marriage, 4 step children Former smoker, 40+yrs of >1ppd, quit 7/10. Restarted, then quit again in 4/22.  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    Current Outpatient Medications  Medication Sig Dispense Refill  . amLODipine (NORVASC) 10 MG tablet Take 1 tablet (10 mg total) by mouth daily. 90 tablet 3  . aspirin 81 MG EC tablet Take 81 mg by mouth at bedtime.    Marland Kitchen atorvastatin (LIPITOR) 80 MG tablet Take 1 tablet (80 mg total) by mouth daily. 90 tablet 3  . carvedilol (COREG) 6.25 MG tablet Take 1 tablet (6.25 mg total) by mouth 2 (two) times daily. 60 tablet 11  . citalopram (CELEXA) 20 MG tablet Take 1 tablet (20 mg total) by mouth daily. 30 tablet 5  . clopidogrel (PLAVIX) 75 MG tablet Take 1 tablet  (75 mg total) by mouth daily. 90 tablet 3  . fluticasone (FLONASE) 50 MCG/ACT nasal spray Place 2 sprays into both nostrils daily as needed for rhinitis or allergies.    Marland Kitchen gabapentin (NEURONTIN) 300 MG capsule TAKE 1 CAPSULE(300 MG) BY MOUTH THREE TIMES DAILY 90 capsule 5  . isosorbide mononitrate (IMDUR) 30 MG 24 hr tablet TAKE 1 TABLET(30 MG) BY MOUTH AT BEDTIME 90 tablet 1  . lisinopril (ZESTRIL) 10 MG tablet TAKE 1 TABLET(10 MG) BY MOUTH DAILY 90 tablet 3  . nicotine (NICODERM CQ - DOSED IN MG/24 HOURS) 21 mg/24hr patch Place 21 mg onto the skin daily.    . Nutritional Supplements (PROTEIN SUPPLEMENT 80% PO) Take 11 oz by mouth daily. Premier Protein chocolate    . oxyCODONE (OXY IR/ROXICODONE) 5 MG immediate release tablet Take 1-2  tablets (5-10 mg total) by mouth every 6 (six) hours as needed for severe pain (to take 2 tabs ONLY when severe pain- which is not usual for pt). 120 tablet 0  . pantoprazole (PROTONIX) 40 MG tablet Take 1 tablet (40 mg total) by mouth daily. 90 tablet 3  . predniSONE (DELTASONE) 5 MG tablet Take 1 tablet (5 mg total) by mouth daily with breakfast. 30 tablet 0  . tamsulosin (FLOMAX) 0.4 MG CAPS capsule Take 0.8 mg by mouth daily.    . traMADol (ULTRAM) 50 MG tablet Take 2 tablets (100 mg total) by mouth 2 (two) times daily. 120 tablet 5  . ezetimibe (ZETIA) 10 MG tablet Take 1 tablet (10 mg total) by mouth daily. 30 tablet 11   No current facility-administered medications for this encounter.    BP 140/80   Pulse 75   Wt 78.7 kg (173 lb 6.4 oz)   SpO2 98%   BMI 26.76 kg/m  General: NAD Neck: No JVD, no thyromegaly or thyroid nodule.  Lungs: Clear to auscultation bilaterally with normal respiratory effort. CV: Nondisplaced PMI.  Heart regular S1/S2, no S3/S4, 3/6 SEM RUSB with obscured S2.  No peripheral edema.  No carotid bruit.  Normal pedal pulses.  Abdomen: Soft, nontender, no hepatosplenomegaly, no distention.  Skin: Intact without lesions or rashes.   Neurologic: Alert and oriented x 3.  Psych: Normal affect. Extremities: No clubbing or cyanosis.  HEENT: Normal.   Assessment/Plan:  1. Aortic stenosis: This has progressed, clearly severe on 2/22 echo. AS is symptomatic, dyspnea with moderate-heavy exertion.  Also notes occasional orthostatic symptoms. - Plan for TAVR 06/07/20, will use left subclavian access given presence of AAA.   2. CAD: LHC in 2/22 with CTO RCA with collaterals, 95% pLAD treated with DES, 60-70% proximal LCx (FFR negative). No further exertional chest pain since PCI.  - Continue atorvastatin at higher dose, check lipids/LFTs today.  - Continue ASA 81 daily as above.  3. Hyperlipidemia:   - Check lipids today as above.   4. HTN: BP higher on prednisone (for PMR).   - Continue Coreg, amlodipine and lisinopril.  BMET today.  I will not increase BP meds until after TAVR due to occasional orthostatic symptoms (SBP running 130s at home).  5. Abdominal aortic aneurysm: 5 cm by CT in 3/22.   - He has seen Dr. Donzetta Matters with [facility], will followup in 6 months with repeat US.    6. Carotid stenosis: Mild by 2/22 dopplers.  7. Smoking: He has quit now for about a week with nicotine patches.   Followup in 3 months.    Loralie Champagne 05/23/2020

## 2020-05-23 NOTE — Telephone Encounter (Signed)
Patient advised and verbalized understanding. Lab appt scheduled, lab order entered, New Rx sent into patients pharmacy.   Orders Placed This Encounter  Procedures  . Lipid panel    Standing Status:   Future    Standing Expiration Date:   05/23/2021    Order Specific Question:   Release to patient    Answer:   Immediate  . Hepatic function panel    Standing Status:   Future    Standing Expiration Date:   05/23/2021    Order Specific Question:   Release to patient    Answer:   Immediate    Meds ordered this encounter  Medications  . ezetimibe (ZETIA) 10 MG tablet    Sig: Take 1 tablet (10 mg total) by mouth daily.    Dispense:  30 tablet    Refill:  11

## 2020-05-23 NOTE — Patient Instructions (Signed)
EKG done today.  Labs done today. We will contact you only if your labs are abnormal.  No medication changes were made. Please continue all current medications as prescribed.  Your physician recommends that you schedule a follow-up appointment in: 3 months  If you have any questions or concerns before your next appointment please send [place] a message through [place] or call our office at [phone].    TO LEAVE A MESSAGE FOR THE NURSE SELECT OPTION 2, PLEASE LEAVE A MESSAGE INCLUDING: . YOUR NAME . DATE OF BIRTH . CALL BACK NUMBER . REASON FOR CALL**this is important as we prioritize the call backs  YOU WILL RECEIVE A CALL BACK THE SAME DAY AS LONG AS YOU CALL BEFORE 4:00 PM   Do the following things EVERYDAY: 1) Weigh yourself in the morning before breakfast. Write it down and keep it in a log. 2) Take your medicines as prescribed 3) Eat low salt foods--Limit salt (sodium) to 2000 mg per day.  4) Stay as active as you can everyday 5) Limit all fluids for the day to less than 2 liters   At the [facility], you and your health needs are our priority. As part of our continuing mission to provide you with exceptional heart care, we have created designated Provider Care Teams. These Care Teams include your primary Cardiologist (physician) and Advanced Practice Providers (APPs- Physician Assistants and Nurse Practitioners) who all work together to provide you with the care you need, when you need it.   You may see any of the following providers on your designated Care Team at your next follow up: Marland Kitchen Dr Glori Bickers . Dr Loralie Champagne . Darrick Grinder, NP . Lyda Jester, PA . Audry Riles, PharmD   Please be sure to bring in all your medications bottles to every appointment.

## 2020-05-24 ENCOUNTER — Other Ambulatory Visit: Payer: Self-pay | Admitting: *Deleted

## 2020-05-24 MED ORDER — PREDNISONE 1 MG PO TABS: ORAL_TABLET | ORAL | 2 refills | Status: DC

## 2020-05-24 NOTE — Telephone Encounter (Signed)
I returned patient's call.  He stated that when he tapered prednisone from 7 mg p.o. daily to 6 mg p.o. daily, he started having increased pain.  He has a procedure coming up soon.  He is very stiff in the morning.  I advised him to increase prednisone 7 mg p.o. every morning for right now until the follow-up visit.  We will repeat sed rate at the follow-up visit.  Please send a prescription for prednisone 1 mg tablet 2 tablets p.o. every morning with refills x2.

## 2020-05-31 ENCOUNTER — Other Ambulatory Visit: Payer: Self-pay

## 2020-05-31 ENCOUNTER — Telehealth: Payer: Self-pay

## 2020-05-31 DIAGNOSIS — I350 Nonrheumatic aortic (valve) stenosis: Secondary | ICD-10-CM

## 2020-05-31 MED ORDER — OXYCODONE HCL 5 MG PO TABS: 5.0000 mg | ORAL_TABLET | Freq: Four times a day (QID) | ORAL | 0 refills | Status: DC | PRN

## 2020-05-31 NOTE — Telephone Encounter (Signed)
Task completed

## 2020-06-03 ENCOUNTER — Ambulatory Visit: Payer: Medicare Other | Admitting: Physical Therapy

## 2020-06-03 ENCOUNTER — Other Ambulatory Visit (HOSPITAL_COMMUNITY): Payer: Medicare Other

## 2020-06-03 NOTE — Progress Notes (Signed)
Surgical Instructions    Your procedure is scheduled on 06/07/20.  Report to [facility] Main Entrance "A" at 05:30 A.M., then check in with the Admitting office.  Call this number if you have problems the morning of surgery:  [phone]   If you have any questions prior to your surgery date call [phone]: Open Monday-Friday 8am-4pm    Remember:  Do not eat or drink after midnight the night before your surgery   Continue taking all medications without change through the day before surgery.              On the morning of surgery do not take any medications.                         Do not wear jewelry, make up, or nail polish            Do not wear lotions, powders, perfumes/colognes, or deodorant.            Men may shave face and neck.            Do not bring valuables to the hospital.            [facility] is not responsible for any belongings or valuables.  Do NOT Smoke (Tobacco/Vaping) or drink Alcohol 24 hours prior to your procedure If you use a CPAP at night, you may bring all equipment for your overnight stay.   Contacts, glasses, dentures or bridgework may not be worn into surgery, please bring cases for these belongings   For patients admitted to the hospital, discharge time will be determined by your treatment team.   Patients discharged the day of surgery will not be allowed to drive home, and someone needs to stay with them for 24 hours.    Special instructions:   [place]- Preparing For Surgery  Before surgery, you can play an important role. Because skin is not sterile, your skin needs to be as free of germs as possible. You can reduce the number of germs on your skin by washing with CHG (chlorahexidine gluconate) Soap before surgery.  CHG is an antiseptic cleaner which kills germs and bonds with the skin to continue killing germs even after washing.    Oral Hygiene is also important to reduce your risk of infection.  Remember - BRUSH YOUR TEETH THE  MORNING OF SURGERY WITH YOUR REGULAR TOOTHPASTE  Please do not use if you have an allergy to CHG or antibacterial soaps. If your skin becomes reddened/irritated stop using the CHG.  Do not shave (including legs and underarms) for at least 48 hours prior to first CHG shower. It is OK to shave your face.  Please follow these instructions carefully.   1. Shower the NIGHT BEFORE SURGERY and the MORNING OF SURGERY  2. If you chose to wash your hair, wash your hair first as usual with your normal shampoo.  3. After you shampoo, rinse your hair and body thoroughly to remove the shampoo.  4. Wash Face and genitals (private parts) with your normal soap.   5.  Shower the NIGHT BEFORE SURGERY and the MORNING OF SURGERY with CHG Soap.   6. Use CHG Soap as you would any other liquid soap. You can apply CHG directly to the skin and wash gently with a scrungie or a clean washcloth.   7. Apply the CHG Soap to your body ONLY FROM THE NECK DOWN.  Do not use on  open wounds or open sores. Avoid contact with your eyes, ears, mouth and genitals (private parts). Wash Face and genitals (private parts)  with your normal soap.   8. Wash thoroughly, paying special attention to the area where your surgery will be performed.  9. Thoroughly rinse your body with warm water from the neck down.  10. DO NOT shower/wash with your normal soap after using and rinsing off the CHG Soap.  11. Pat yourself dry with a CLEAN TOWEL.  12. Wear CLEAN PAJAMAS to bed the night before surgery  13. Place CLEAN SHEETS on your bed the night before your surgery  14. DO NOT SLEEP WITH PETS.   Day of Surgery: Take a shower with CHG soap. Wear Clean/Comfortable clothing the morning of surgery Do not apply any deodorants/lotions.   Remember to brush your teeth WITH YOUR REGULAR TOOTHPASTE.   Please read over the following fact sheets that you were given.

## 2020-06-06 ENCOUNTER — Encounter (HOSPITAL_COMMUNITY)
Admission: RE | Admit: 2020-06-06 | Discharge: 2020-06-06 | Disposition: A | Discharge status: Home / Self Care | Payer: Medicare Other | Source: Ambulatory Visit | Attending: Cardiovascular Disease | Admitting: Cardiovascular Disease

## 2020-06-06 ENCOUNTER — Ambulatory Visit (HOSPITAL_COMMUNITY)
Admission: RE | Admit: 2020-06-06 | Discharge: 2020-06-06 | Disposition: A | Discharge status: Home / Self Care | Payer: Medicare Other | Source: Ambulatory Visit | Attending: Cardiovascular Disease | Admitting: Cardiovascular Disease

## 2020-06-06 ENCOUNTER — Encounter: Payer: Self-pay | Admitting: Physical Therapy

## 2020-06-06 ENCOUNTER — Ambulatory Visit: Payer: Medicare Other | Attending: Cardiovascular Disease | Admitting: Physical Therapy

## 2020-06-06 ENCOUNTER — Other Ambulatory Visit: Payer: Self-pay

## 2020-06-06 ENCOUNTER — Encounter (HOSPITAL_COMMUNITY): Payer: Self-pay

## 2020-06-06 DIAGNOSIS — Z01818 Encounter for other preprocedural examination: Secondary | ICD-10-CM | POA: Insufficient documentation

## 2020-06-06 DIAGNOSIS — K222 Esophageal obstruction: Secondary | ICD-10-CM | POA: Diagnosis not present

## 2020-06-06 DIAGNOSIS — Z8673 Personal history of transient ischemic attack (TIA), and cerebral infarction without residual deficits: Secondary | ICD-10-CM | POA: Diagnosis not present

## 2020-06-06 DIAGNOSIS — I714 Abdominal aortic aneurysm, without rupture: Secondary | ICD-10-CM | POA: Diagnosis not present

## 2020-06-06 DIAGNOSIS — F419 Anxiety disorder, unspecified: Secondary | ICD-10-CM | POA: Diagnosis not present

## 2020-06-06 DIAGNOSIS — Z952 Presence of prosthetic heart valve: Secondary | ICD-10-CM | POA: Diagnosis not present

## 2020-06-06 DIAGNOSIS — Z7952 Long term (current) use of systemic steroids: Secondary | ICD-10-CM | POA: Diagnosis not present

## 2020-06-06 DIAGNOSIS — I350 Nonrheumatic aortic (valve) stenosis: Secondary | ICD-10-CM | POA: Diagnosis not present

## 2020-06-06 DIAGNOSIS — M545 Low back pain, unspecified: Secondary | ICD-10-CM | POA: Diagnosis present

## 2020-06-06 DIAGNOSIS — Z8546 Personal history of malignant neoplasm of prostate: Secondary | ICD-10-CM | POA: Diagnosis not present

## 2020-06-06 DIAGNOSIS — I5032 Chronic diastolic (congestive) heart failure: Secondary | ICD-10-CM | POA: Diagnosis not present

## 2020-06-06 DIAGNOSIS — R293 Abnormal posture: Secondary | ICD-10-CM | POA: Insufficient documentation

## 2020-06-06 DIAGNOSIS — F32A Depression, unspecified: Secondary | ICD-10-CM | POA: Diagnosis not present

## 2020-06-06 DIAGNOSIS — K219 Gastro-esophageal reflux disease without esophagitis: Secondary | ICD-10-CM | POA: Diagnosis not present

## 2020-06-06 DIAGNOSIS — E785 Hyperlipidemia, unspecified: Secondary | ICD-10-CM | POA: Diagnosis not present

## 2020-06-06 DIAGNOSIS — I2510 Atherosclerotic heart disease of native coronary artery without angina pectoris: Secondary | ICD-10-CM | POA: Diagnosis not present

## 2020-06-06 DIAGNOSIS — Z006 Encounter for examination for normal comparison and control in clinical research program: Secondary | ICD-10-CM | POA: Diagnosis not present

## 2020-06-06 DIAGNOSIS — I352 Nonrheumatic aortic (valve) stenosis with insufficiency: Secondary | ICD-10-CM | POA: Diagnosis not present

## 2020-06-06 DIAGNOSIS — Z20822 Contact with and (suspected) exposure to covid-19: Secondary | ICD-10-CM | POA: Diagnosis not present

## 2020-06-06 DIAGNOSIS — L409 Psoriasis, unspecified: Secondary | ICD-10-CM | POA: Diagnosis not present

## 2020-06-06 DIAGNOSIS — Z954 Presence of other heart-valve replacement: Secondary | ICD-10-CM | POA: Diagnosis not present

## 2020-06-06 DIAGNOSIS — Z85818 Personal history of malignant neoplasm of other sites of lip, oral cavity, and pharynx: Secondary | ICD-10-CM | POA: Diagnosis not present

## 2020-06-06 DIAGNOSIS — Z923 Personal history of irradiation: Secondary | ICD-10-CM | POA: Diagnosis not present

## 2020-06-06 DIAGNOSIS — F17210 Nicotine dependence, cigarettes, uncomplicated: Secondary | ICD-10-CM | POA: Diagnosis present

## 2020-06-06 DIAGNOSIS — I110 Hypertensive heart disease with heart failure: Secondary | ICD-10-CM | POA: Diagnosis not present

## 2020-06-06 DIAGNOSIS — G894 Chronic pain syndrome: Secondary | ICD-10-CM | POA: Diagnosis not present

## 2020-06-06 DIAGNOSIS — I083 Combined rheumatic disorders of mitral, aortic and tricuspid valves: Secondary | ICD-10-CM | POA: Diagnosis not present

## 2020-06-06 DIAGNOSIS — I10 Essential (primary) hypertension: Secondary | ICD-10-CM | POA: Diagnosis not present

## 2020-06-06 DIAGNOSIS — I25119 Atherosclerotic heart disease of native coronary artery with unspecified angina pectoris: Secondary | ICD-10-CM | POA: Diagnosis present

## 2020-06-06 DIAGNOSIS — Z886 Allergy status to analgesic agent status: Secondary | ICD-10-CM | POA: Diagnosis not present

## 2020-06-06 DIAGNOSIS — Z955 Presence of coronary angioplasty implant and graft: Secondary | ICD-10-CM | POA: Diagnosis not present

## 2020-06-06 DIAGNOSIS — M353 Polymyalgia rheumatica: Secondary | ICD-10-CM | POA: Diagnosis not present

## 2020-06-06 DIAGNOSIS — I700 Atherosclerosis of aorta: Secondary | ICD-10-CM | POA: Insufficient documentation

## 2020-06-06 HISTORY — DX: Depression, unspecified: F32.A

## 2020-06-06 HISTORY — DX: Abdominal aortic aneurysm, without rupture: I71.4

## 2020-06-06 HISTORY — DX: Dyspnea, unspecified: R06.00

## 2020-06-06 HISTORY — DX: Abdominal aortic aneurysm, without rupture, unspecified: I71.40

## 2020-06-06 LAB — BLOOD GAS, ARTERIAL
Acid-Base Excess: 5.2 mmol/L — ABNORMAL HIGH (ref 0.0–2.0)
Bicarbonate: 29.4 mmol/L — ABNORMAL HIGH (ref 20.0–28.0)
Drawn by: 602861
FIO2: 21
O2 Saturation: 96.5 %
Patient temperature: 37
pCO2 arterial: 44.5 mmHg (ref 32.0–48.0)
pH, Arterial: 7.435 (ref 7.350–7.450)
pO2, Arterial: 82.3 mmHg — ABNORMAL LOW (ref 83.0–108.0)

## 2020-06-06 LAB — PROTIME-INR
INR: 1 (ref 0.8–1.2)
Prothrombin Time: 13.3 seconds (ref 11.4–15.2)

## 2020-06-06 LAB — COMPREHENSIVE METABOLIC PANEL
ALT: 11 U/L (ref 0–44)
AST: 14 U/L — ABNORMAL LOW (ref 15–41)
Albumin: 3.4 g/dL — ABNORMAL LOW (ref 3.5–5.0)
Alkaline Phosphatase: 84 U/L (ref 38–126)
Anion gap: 11 (ref 5–15)
BUN: 13 mg/dL (ref 8–23)
CO2: 26 mmol/L (ref 22–32)
Calcium: 8.8 mg/dL — ABNORMAL LOW (ref 8.9–10.3)
Chloride: 100 mmol/L (ref 98–111)
Creatinine, Ser: 0.56 mg/dL — ABNORMAL LOW (ref 0.61–1.24)
GFR, Estimated: >60 mL/min (ref >60)
Glucose, Bld: 120 mg/dL — ABNORMAL HIGH (ref 70–99)
Potassium: 3.7 mmol/L (ref 3.5–5.1)
Sodium: 137 mmol/L (ref 135–145)
Total Bilirubin: 0.9 mg/dL (ref 0.3–1.2)
Total Protein: 6.3 g/dL — ABNORMAL LOW (ref 6.5–8.1)

## 2020-06-06 LAB — TYPE AND SCREEN
ABO/RH(D): O POS
Antibody Screen: NEGATIVE

## 2020-06-06 LAB — URINALYSIS, ROUTINE W REFLEX MICROSCOPIC
Bilirubin Urine: NEGATIVE
Glucose, UA: NEGATIVE mg/dL
Hgb urine dipstick: NEGATIVE
Ketones, ur: NEGATIVE mg/dL
Leukocytes,Ua: NEGATIVE
Nitrite: NEGATIVE
Protein, ur: NEGATIVE mg/dL
Specific Gravity, Urine: 1.01 (ref 1.005–1.030)
pH: 7 (ref 5.0–8.0)

## 2020-06-06 LAB — CBC
HCT: 37.2 % — ABNORMAL LOW (ref 39.0–52.0)
Hemoglobin: 11.2 g/dL — ABNORMAL LOW (ref 13.0–17.0)
MCH: 25.3 pg — ABNORMAL LOW (ref 26.0–34.0)
MCHC: 30.1 g/dL (ref 30.0–36.0)
MCV: 84 fL (ref 80.0–100.0)
Platelets: 160 10*3/uL (ref 150–400)
RBC: 4.43 MIL/uL (ref 4.22–5.81)
RDW: 14.4 % (ref 11.5–15.5)
WBC: 10 10*3/uL (ref 4.0–10.5)
nRBC: 0 % (ref 0.0–0.2)

## 2020-06-06 LAB — SURGICAL PCR SCREEN
MRSA, PCR: NEGATIVE
Staphylococcus aureus: NEGATIVE

## 2020-06-06 LAB — SARS CORONAVIRUS 2 (TAT 6-24 HRS): SARS Coronavirus 2: NEGATIVE

## 2020-06-06 MED ORDER — SODIUM CHLORIDE 0.9 % IV SOLN
INTRAVENOUS | Status: DC
  Filled 2020-06-06: qty 30

## 2020-06-06 MED ORDER — DEXMEDETOMIDINE HCL IN NACL 400 MCG/100ML IV SOLN
0.1000 ug/kg/h | INTRAVENOUS | Status: DC
  Filled 2020-06-06: qty 100

## 2020-06-06 MED ORDER — VANCOMYCIN HCL 1500 MG/300ML IV SOLN
1500.0000 mg | INTRAVENOUS | Status: AC
  Administered 2020-06-07: 1500 mg via INTRAVENOUS
  Filled 2020-06-06: qty 300

## 2020-06-06 MED ORDER — MAGNESIUM SULFATE 50 % IJ SOLN
40.0000 meq | INTRAMUSCULAR | Status: DC
  Filled 2020-06-06: qty 9.85

## 2020-06-06 MED ORDER — POTASSIUM CHLORIDE 2 MEQ/ML IV SOLN
80.0000 meq | INTRAVENOUS | Status: DC
  Filled 2020-06-06: qty 40

## 2020-06-06 MED ORDER — SODIUM CHLORIDE 0.9 % IV SOLN
1.5000 g | INTRAVENOUS | Status: AC
  Administered 2020-06-07: 1.5 g via INTRAVENOUS
  Filled 2020-06-06: qty 1.5

## 2020-06-06 MED ORDER — NOREPINEPHRINE 4 MG/250ML-% IV SOLN
0.0000 ug/min | INTRAVENOUS | Status: AC
Start: 2020-06-07 — End: 2020-06-07
  Administered 2020-06-07: 2 ug/min via INTRAVENOUS
  Filled 2020-06-06: qty 250

## 2020-06-06 NOTE — Progress Notes (Addendum)
PCP - Arlester Marker Cardiologist - Dr. Loralie Champagne Vascular: Dr. Servando Snare  PPM/ICD - denies  Chest x-ray - 06/06/20 EKG - 05/23/20 Stress Test - over 10 years ago ECHO - 03/24/20 Cardiac Cath - 03/31/20  Sleep Study -denies    Continue taking all medications without change through the day before surgery. On the morning of surgery do not take any medications.   ERAS Protcol -no   COVID TEST- 06/06/20   Anesthesia review: no  Patient denies shortness of breath, fever, cough and chest pain at PAT appointment   All instructions explained to the patient, with a verbal understanding of the material. Patient agrees to go over the instructions while at home for a better understanding. Patient also instructed to self quarantine after being tested for COVID-19. The opportunity to ask questions was provided.

## 2020-06-06 NOTE — Therapy (Signed)
[place], [place], [address] Phone: [phone]   Fax:  [phone]  Physical Therapy Evaluation  Patient Details  Name: MIKKEL CHARRETTE MRN: [id number] Date of Birth: [birth year] Referring Provider (PT): Sherren Mocha MD   Encounter Date: 06/06/2020   PT End of Session - 06/06/20 1033    Visit Number 1    Number of Visits 1    Date for PT Re-Evaluation 06/07/20    PT Start Time 7408    PT Stop Time 1105    PT Time Calculation (min) 30 min    Activity Tolerance Patient tolerated treatment well    Behavior During Therapy [facility] for tasks assessed/performed           Past Medical History:  Diagnosis Date  . 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 Aortic stenosis    mild echocardiogram 8/09 EF 60%, showed no regional wall motion abnml, mild LVH, mod focal basal septal hypertrophy and mild dyastolic dysfunction. 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 mean gradient across aortic valve was 8 mmHG. also mild L atrial enlargement and normal RV size and function. minimal AS on LHC in 7/10.   . Arthritis    "all over" (07/19/2016)  . BPH (benign prostatic hypertrophy)   . CAD (coronary artery disease)    LCH (7/10) totally occluded proximal RCA with very robuse L to R collaterals, 50% proximal LAD stenosis, EF 65%, medical management.    . CHF (congestive heart failure) ([place])   . Chicken pox   . Chronic lower back pain    s/p surgical fusion  . Depression   . Diverticulosis   . Dyspnea   . Esophageal stricture   . GERD (gastroesophageal reflux disease)   . Heart murmur   . Hepatitis B 1984  . Hiatal hernia   . History of radiation therapy 01/16/18- 03/05/18   Left Tonsil, 66 Gy in 33 fractions to high risk nodal echelons.   Marland Kitchen HLD (hyperlipidemia)   . HTN (hypertension)   . 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 Osteoarthritis   . Osteoarthritis   . Stroke [facility])    TIA over 20 years  ago  . TIA (transient ischemic attack) 1990s   hx  . tonsillar ca dx'd 11/2017  . Tubular adenoma of colon 2009    Past Surgical History:  Procedure Laterality Date  . BACK SURGERY    . CARDIAC CATHETERIZATION    . CATARACT EXTRACTION W/ INTRAOCULAR LENS  IMPLANT, BILATERAL Bilateral 01/2012 - 02/2012  . CORONARY ATHERECTOMY N/A 04/27/2020   Procedure: CORONARY ATHERECTOMY;  Surgeon: Sherren Mocha, MD;  Location: [place] CV LAB;  Service: Cardiovascular;  Laterality: N/A;  . CORONARY STENT INTERVENTION N/A 04/27/2020   Procedure: CORONARY STENT INTERVENTION;  Surgeon: Sherren Mocha, MD;  Location: [place] CV LAB;  Service: Cardiovascular;  Laterality: N/A;  . DIRECT LARYNGOSCOPY Left 10/09/2017   Procedure: DIRECT LARYNGOSCOPY WITH BOPSY;  Surgeon: Jodi Marble, MD;  Location: [place];  Service: ENT;  Laterality: Left;  . ESOPHAGOGASTRODUODENOSCOPY (EGD) WITH ESOPHAGEAL DILATION     "couple times" (07/19/2016)  . ESOPHAGOSCOPY Left 10/09/2017   Procedure: ESOPHAGOSCOPY;  Surgeon: Jodi Marble, MD;  Location: [place];  Service: ENT;  Laterality: Left;  . INTRAVASCULAR IMAGING/OCT N/A 04/27/2020   Procedure: INTRAVASCULAR IMAGING/OCT;  Surgeon: Sherren Mocha, MD;  Location: [place] CV LAB;  Service: Cardiovascular;  Laterality: N/A;  .  INTRAVASCULAR PRESSURE WIRE/FFR STUDY N/A 04/27/2020   Procedure: INTRAVASCULAR PRESSURE WIRE/FFR STUDY;  Surgeon: Sherren Mocha, MD;  Location: [place] CV LAB;  Service: Cardiovascular;  Laterality: N/A;  . IR GASTROSTOMY TUBE MOD SED  01/08/2018  . IR THORACENTESIS ASP PLEURAL SPACE W/IMG GUIDE  07/19/2016  . LAPAROSCOPIC CHOLECYSTECTOMY  1994  . LUMBAR DISC SURGERY  05/1996   L4-5; Dr. Sherwood Gambler   . LUMBAR LAMINECTOMY/DECOMPRESSION MICRODISCECTOMY  10/2002   L3-4. Dr. Sherwood Gambler  . MULTIPLE TOOTH EXTRACTIONS  1980s  . PARTIAL GLOSSECTOMY  12/02/2017   Dr. Nicolette Bang- [facility]  . pharyngoplasty for closure  of tingue base defect  12/02/2017   Dr. Nicolette Bang- [facility]  . PICC LINE INSERTION  07/15/2016  . POSTERIOR LUMBAR FUSION  09/1996   Ray cage, L4-5 Dr. Rita Ohara  . PROSTATE BIOPSY  ~ 2017  . radical tonsillectomy Left 12/02/2017   Dr. Nicolette Bang at [facility]  . RIGHT HEART CATH AND CORONARY ANGIOGRAPHY N/A 03/31/2020   Procedure: RIGHT HEART CATH AND CORONARY ANGIOGRAPHY;  Surgeon: Larey Dresser, MD;  Location: [place] CV LAB;  Service: Cardiovascular;  Laterality: N/A;  . RIGID BRONCHOSCOPY Left 10/09/2017   Procedure: RIGID BRONCHOSCOPY;  Surgeon: Jodi Marble, MD;  Location: [place];  Service: ENT;  Laterality: Left;  . TONSILLECTOMY    . TRACHEOSTOMY  12/02/2017   Dr. Nicolette Bang- [facility]  . VASCULAR SURGERY      There were no vitals filed for this visit.    Subjective Assessment - 06/06/20 1036    Subjective pt is a 80 y.o M with hisotry of heart murmur. He notes . CC of exertional dyspnea and chest discomfort that goes across the chest and symptoms are worsening over the past year, and reports it is progressivley worsening. He reports having more sciatic nerve issues which occurs in the R Leg but has no pain today.    Patient Stated Goals to fix heart    Currently in Pain? No/denies              [facility] PT Assessment - 06/06/20 0001      Assessment   Medical Diagnosis Severe Aortic Stenosis    Referring Provider (PT) Sherren Mocha MD    Hand Dominance Right      Precautions   Precautions None      Restrictions   Weight Bearing Restrictions No      Balance Screen   Has the patient fallen in the past 6 months No    Has the patient had a decrease in activity level because of a fear of falling?  No    Is the patient reluctant to leave their home because of a fear of falling?  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   Overall Cognitive Status Within Functional Limits for tasks assessed      Posture/Postural Control  Posture/Postural Control Postural limitations    Postural Limitations Rounded Shoulders;Forward head      ROM / Strength   AROM / PROM / Strength AROM;Strength      AROM   Overall AROM  Within functional limits for tasks performed      Strength   Overall Strength Within functional limits for tasks performed    Strength Assessment Site Hand    Right/Left hand Right;Left    Right Hand Grip (lbs) 82    Left Hand Grip (lbs) 66            [facility] Pre-Surgical Assessment - 06/06/20 0001    5 Meter Walk Test- trial 1 4 sec    5 Meter Walk Test- trial 2 4 sec.     5 Meter Walk Test- trial 3 4 sec.    5 meter walk test average 4 sec    4 Stage Balance Test tolerated for:  2 sec.    4 Stage Balance Test Position 4    Sit To Stand Test- trial 1 16 sec.    ADL/IADL Independent with: Bathing;Dressing;Meal prep;Finances;Yard work    ADL/IADL Therapist, sports Index Vulnerable    6 Minute Walk- Baseline yes    BP (mmHg) 148/70    HR (bpm) 64    02 Sat (%RA) 96 %    Modified Borg Scale for Dyspnea 0.5- Very, very slight shortness of breath    Perceived Rate of Exertion (Borg) 9- very light    6 Minute Walk Post Test yes    BP (mmHg) 175/76    HR (bpm) 91    02 Sat (%RA) 96 %    Modified Borg Scale for Dyspnea 2- Mild shortness of breath    Perceived Rate of Exertion (Borg) 12-    Aerobic Endurance Distance Walked 1410    Endurance additional comments pt is 8.95% limited compared to age related norm                    Objective measurements  completed on examination: See above findings.                    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 to include; compression and self MLD    Status Partially Met      PT LONG TERM GOAL #2   Title Pt will improve cervical rotation to the Right to at least 30degrees    Status On-going      PT LONG TERM GOAL #3   Title pt will be ind with postural and cervical exercises for HEP    Status On-going                  Plan - 06/06/20 1035    Clinical Impression Statement see assessment in note    Stability/Clinical Decision Making Stable/Uncomplicated    Clinical Decision Making Low    PT Frequency One time visit    PT Next Visit Plan Pre-TAVR evaluation    Consulted and Agree with Plan of Care Patient             Clinical Impression Statement: Pt is a 80 yo M presenting to OP PT for evaluation prior to possible TAVR surgery due to severe aortic stenosis. Pt reports onset of Chest pain/ tightness and shortness of breath approximately 12 months ago.  Symptoms are limiting prolonged walking. Pt presents with good ROM and strength, good balance and is assessed as low at high fall risk 4 stage balance test, good walking speed and fair aerobic endurance per 6 minute walk test. Pt ambulated 1410 feet in without requesting a seated rest beak. At end of test, patient's HR was 91 bpm and O2 was 96% on room air. Pt reported 2/10 shortness of breath on modified scale for dyspnea.  Pt ambulated a total of 1410 feet in 6 minute walk. SOB  increased significantly with 6 minute walk test. Based on the Short Physical Performance Battery, patient has a frailty rating of 10/12 with </= 5/12 considered frail.   Patient demonstrated the following deficits and impairments:     Visit Diagnosis: Abnormal posture     Problem List Patient Active Problem List   Diagnosis Date Noted  . Reactive  depression 04/25/2020  . 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 Primary osteoarthritis of both hands 01/11/2020  . Piriformis syndrome of both sides 09/23/2019  . Pain of both shoulder joints 07/29/2019  . Chronic right hip pain 07/29/2019  . Sciatica associated with disorder of lumbar spine 04/24/2019  . Primary osteoarthritis involving multiple joints 04/24/2019  . Chronic pain syndrome 04/24/2019  . Encounter for chronic pain management 04/15/2018  . Anxiety and depression 04/15/2018  . 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 Cancer of tonsillar fossa ([place]) 09/20/2017  . 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 Aneurysm of infrarenal abdominal aorta ([facility]) 07/10/2016  . Normocytic anemia 07/10/2016  . Tobacco abuse disorder 01/10/2014  . Essential hypertension 05/19/2013  . Osteoarthritis 05/29/2009  . Psoriasis 01/13/2009  . Coronary artery disease 09/15/2008  . Angina pectoris ([place]) 08/02/2008  . Severe aortic stenosis 01/28/2008  . Diverticulosis of colon 08/27/2007  . History of benign prostatic hyperplasia 08/27/2007  . Hyperlipidemia 03/19/2007  . Gastroesophageal reflux disease 03/19/2007    Starr Lake PT, DPT, LAT, ATC  06/06/20  11:07 AM      [facility] [address] [place], [place], [address] Phone: [phone]   Fax:  [phone]  Name: MARTAVION COUPER MRN: [id number] Date of Birth: [birth year]

## 2020-06-06 NOTE — H&P (Signed)
[address]       [place] [address]             [phone]      Cardiothoracic Surgery Admission History and Physical   Referring Provider is Larey Dresser, MD Primary Cardiologist is No primary care provider on file. PCP is [name], [name], MD      Chief Complaint  Patient presents with  . Coronary Artery Disease  . 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 Aundra Dubin.  Recent echocardiogram showed severe aortic stenosis with a mean gradient of 50 mmHg and a valve area of 0.96 cm.  Left ventricular ejection fraction was 60 to 65%.  Cardiac catheterization on 03/31/2020 showed a known occluded right coronary artery with collaterals from the LAD, 95% calcified proximal LAD stenosis, and a long 60 to 70% proximal left circumflex stenosis.  He underwent successful orbital atherectomy and stenting of the severe calcific stenosis in the proximal LAD on 04/27/2020.  The left circumflex stenosis was felt to be moderate with negative pressure wire analysis he has been continued on dual antiplatelet therapy with aspirin and Plavix.  Prior to his stenting procedure he said that he felt fairly well and does what he wants to do but is not very active.  He does note shortness of breath and some chest pressure with heavy exertion like moving his trash cans of the street or doing heavy lifting.  He can walk around his house without symptoms but does not get outside walking much.  He denies any dizziness or syncope.  He has no orthopnea.  He denies peripheral edema.  He  is here today with his wife.  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  He has a long history of smoking but quit for a while and just recently started again but is now trying to quit.  He was smoking 1 pack/day prior to trying to quit.      Past Medical History:  Diagnosis Date  . Aortic stenosis    mild echocardiogram 8/09 EF 60%, showed no regional wall motion abnml, mild LVH, mod focal basal septal hypertrophy and mild dyastolic dysfunction. 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 mean gradient across aortic valve was 8 mmHG. also mild L atrial enlargement and normal RV size and function. minimal AS on LHC in 7/10.   . Arthritis    "all over" (07/19/2016)  . BPH (benign prostatic hypertrophy)   . CAD (coronary artery disease)    [facility] (7/10) totally occluded proximal RCA with very robuse L to R collaterals, 50% proximal LAD stenosis, EF 65%, medical management.    . CHF (congestive heart failure) ([place])   . Chicken pox   . Chronic lower back pain    s/p surgical fusion  . Diverticulosis   . Esophageal stricture   . GERD (gastroesophageal reflux disease)   . Heart murmur   . Hepatitis B 1984  . Hiatal  hernia   . History of radiation therapy 01/16/18- 03/05/18   Left Tonsil, 66 Gy in 33 fractions to high risk nodal echelons.   Marland Kitchen HLD (hyperlipidemia)   . HTN (hypertension)   . 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 Osteoarthritis   . TIA (transient ischemic attack) 1990s   hx  . tonsillar ca dx'd 11/2017  . Tubular adenoma of colon 2009         Past Surgical History:  Procedure Laterality Date  . BACK SURGERY    . CATARACT EXTRACTION W/ INTRAOCULAR LENS  IMPLANT, BILATERAL Bilateral 01/2012 - 02/2012  . DIRECT LARYNGOSCOPY Left 10/09/2017   Procedure: DIRECT LARYNGOSCOPY WITH BOPSY;  Surgeon: Flo Shanks, MD;  Location: [facility];  Service: ENT;   Laterality: Left;  . ESOPHAGOGASTRODUODENOSCOPY (EGD) WITH ESOPHAGEAL DILATION     "couple times" (07/19/2016)  . ESOPHAGOSCOPY Left 10/09/2017   Procedure: ESOPHAGOSCOPY;  Surgeon: Flo Shanks, MD;  Location: [facility];  Service: ENT;  Laterality: Left;  . IR GASTROSTOMY TUBE MOD SED  01/08/2018  . IR THORACENTESIS ASP PLEURAL SPACE W/IMG GUIDE  07/19/2016  . LAPAROSCOPIC CHOLECYSTECTOMY  1994  . LUMBAR DISC SURGERY  05/1996   L4-5; Dr. Newell Coral   . LUMBAR LAMINECTOMY/DECOMPRESSION MICRODISCECTOMY  10/2002   L3-4. Dr. Newell Coral  . MULTIPLE TOOTH EXTRACTIONS  1980s  . PARTIAL GLOSSECTOMY  12/02/2017   Dr. Hezzie Bump- [facility]  . pharyngoplasty for closure of tingue base defect  12/02/2017   Dr. Hezzie Bump- [facility]  . PICC LINE INSERTION  07/15/2016  . POSTERIOR LUMBAR FUSION  09/1996   Ray cage, L4-5 Dr. Jule Ser  . PROSTATE BIOPSY  ~ 2017  . radical tonsillectomy Left 12/02/2017   Dr. [facility]zzie [facility]ump at [facility]  . RIGHT HEART CATH AND CORONARY ANGIOGRAPHY N/A 03/31/2020   Procedure: RIGHT HEART CATH AND CORONARY ANGIOGRAPHY;  Surgeon: Laurey Morale, MD;  Location: [facility];  Service: Cardiovascular;  Laterality: N/A;  . RIGID BRONCHOSCOPY Left 10/09/2017   Procedure: RIGID BRONCHOSCOPY;  Surgeon: Flo Shanks, MD;  Location: [facility];  Service: ENT;  Laterality: Left;  . TRACHEOSTOMY  12/02/2017   Dr. Hezzie [facility]ump- [facility]         Family History  Problem Relation Age of Onset  . Heart disease Father 54       Living  . Coronary artery disease Father        CABG  . Alzheimer's disease Mother 57       Deceased  . Arthritis Mother   . Aneurysm Brother   . Stomach cancer Maternal Uncle   . Brain cancer Maternal Aunt        x2  . Obesity Daughter        Had Bypass Sx    Social History        Socioeconomic History  . Marital status: Married    Spouse name: etta  . Number of children: 2  . Years of education:  Not on file  . Highest education level: Not on file  Occupational History  . Occupation: retired    Associate Professor: RETIRED    Comment: disabled due to back problems  Tobacco Use  . Smoking status: Former Smoker    Packs/day: 1.50    Years: 50.00    Pack years: 75.00    Types: Cigarettes    Quit date: 09/05/2017    Years since quitting: 2.6  . Smokeless tobacco: Never Used  . Tobacco comment: smoked less than 1  ppd for 40+ years; Using Vapor Cig  Vaping Use  . Vaping Use: Never used  Substance and Sexual Activity  . Alcohol use: Yes    Alcohol/week: 0.0 standard drinks    Comment: 07/19/2016 "might have a few drinks/year"  . Drug use: No  . Sexual activity: Never  Other Topics Concern  . Not on file  Social History Narrative   ** Merged History Encounter **       Married (3rd), [place]. 2 children from 1st marriage, 4 step children.    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       Cell # [phone]   Social Determinants of Health      Financial Resource Strain: Low Risk   . Difficulty of Paying Living Expenses: Not hard at all  Food Insecurity: No Food Insecurity  . Worried About Charity fundraiser in the Last Year: Never true  . Ran Out of Food in the Last Year: Never true  Transportation Needs: No Transportation Needs  . Lack of Transportation (Medical): No  . Lack of Transportation (Non-Medical): No  Physical Activity: Inactive  . Days of Exercise per Week: 0 days  . Minutes of Exercise per Session: 0 min  Stress: No Stress Concern Present  . Feeling of Stress : Not at all  Social Connections: Moderately Isolated  . Frequency of Communication with Friends and Family: More than three times a week  . Frequency of Social Gatherings with Friends and Family: Once a week  . Attends Religious Services: Never  . Active Member of Clubs or Organizations: No  .  Attends Archivist Meetings: Never  . Marital Status: Married  Human resources officer Violence: Not At Risk  . Fear of Current or Ex-Partner: No  . Emotionally Abused: No  . Physically Abused: No  . Sexually Abused: No          Current Outpatient Medications  Medication Sig Dispense Refill  . amLODipine (NORVASC) 10 MG tablet Take 1 tablet (10 mg total) by mouth daily. 90 tablet 3  . aspirin 81 MG EC tablet Take 81 mg by mouth at bedtime.    Marland Kitchen atorvastatin (LIPITOR) 80 MG tablet Take 1 tablet (80 mg total) by mouth daily. 90 tablet 3  . carvedilol (COREG) 6.25 MG tablet Take 1 tablet (6.25 mg total) by mouth 2 (two) times daily. 60 tablet 11  . citalopram (CELEXA) 20 MG tablet Take 1 tablet (20 mg total) by mouth daily. 30 tablet 5  . fluticasone (FLONASE) 50 MCG/ACT nasal spray Place 2 sprays into both nostrils daily as needed for rhinitis or allergies.    Marland Kitchen gabapentin (NEURONTIN) 300 MG capsule TAKE 1 CAPSULE(300 MG) BY MOUTH THREE TIMES DAILY (Patient taking differently: Take 300 mg by mouth 2 (two) times daily.) 90 capsule 5  . isosorbide mononitrate (IMDUR) 30 MG 24 hr tablet TAKE 1 TABLET(30 MG) BY MOUTH AT BEDTIME (Patient taking differently: Take 30 mg by mouth daily.) 90 tablet 1  . lisinopril (ZESTRIL) 10 MG tablet TAKE 1 TABLET(10 MG) BY MOUTH DAILY (Patient taking differently: Take 10 mg by mouth daily.) 90 tablet 3  . Nutritional Supplements (PROTEIN SUPPLEMENT 80% PO) Take 11 oz by mouth daily. Premier Protein chocolate    . omeprazole (PRILOSEC) 20 MG capsule TAKE 1 CAPSULE(20 MG) BY MOUTH DAILY (Patient taking differently: Take 20 mg by mouth daily.) 90 capsule  2  . oxyCODONE (OXY IR/ROXICODONE) 5 MG immediate release tablet Take 1-2 tablets (5-10 mg total) by mouth every 6 (six) hours as needed for severe pain (to take 2 tabs ONLY when severe pain- which is not usual for pt). 120 tablet 0  . predniSONE (DELTASONE) 1 MG tablet Take 1 tablet by mouth daily with the  5 mg tablet (total 6 mg daily). 30 tablet 0  . predniSONE (DELTASONE) 5 MG tablet Take 1 tablet (5 mg total) by mouth daily with breakfast. 30 tablet 0  . tamsulosin (FLOMAX) 0.4 MG CAPS capsule Take 0.4 mg by mouth daily.     . traMADol (ULTRAM) 50 MG tablet Take 2 tablets (100 mg total) by mouth 2 (two) times daily. (Patient taking differently: Take 100 mg by mouth daily as needed for moderate pain or severe pain.) 120 tablet 5   No current facility-administered medications for this visit.        Allergies  Allergen Reactions  . Celebrex [Celecoxib] Hives and Itching      Review of Systems:              General:                      normal appetite, + decreased energy, no weight gain, no weight loss, no fever             Cardiac:                       + chest pain with exertion, no chest pain at rest, +SOB with  exertion, no resting SOB, no PND, no orthopnea, no palpitations, no arrhythmia, no atrial fibrillation, no LE edema, no dizzy spells, no syncope             Respiratory:                 + exertional shortness of breath, no home oxygen, no productive cough, no dry cough, no bronchitis, no wheezing, no hemoptysis, no asthma, no pain with inspiration or cough, no sleep apnea, no CPAP at night             GI:                               no difficulty swallowing, no reflux, no frequent heartburn, no hiatal hernia, no abdominal pain, no constipation, no diarrhea, no hematochezia, no hematemesis, no melena             GU:                              no dysuria,  no frequency, no urinary tract infection, no hematuria, no enlarged prostate, no kidney stones, no kidney disease             Vascular:                     no pain suggestive of claudication, no pain in feet, no leg cramps, no varicose veins, no DVT, no non-healing foot ulcer             Neuro:                         no stroke, + TIA's, no seizures, no headaches, no temporary blindness one eye,  no slurred speech, no  peripheral neuropathy, + chronic pain, no instability of gait, no memory/cognitive dysfunction             Musculoskeletal:         + arthritis, no joint swelling, + myalgias, no difficulty walking, normal mobility              Skin:                            no rash, no itching, no skin infections, no pressure sores or ulcerations             Psych:                         + anxiety, no depression, no nervousness, no unusual recent stress             Eyes:                           no blurry vision, + floaters, no recent vision changes, does not wear glasses or contacts             ENT:                            + hearing loss, no loose or painful teeth, + dentures             Hematologic:               + easy bruising, no abnormal bleeding, no clotting disorder, no frequent epistaxis             Endocrine:                   no diabetes, does not check CBG's at home                            Physical Exam:              BP 111/68 (BP Location: Right Arm, Patient Position: Sitting)   Pulse 74   Resp 20   Ht 5\' 7"  (1.702 m)   SpO2 94% Comment: RA  BMI 27.11 kg/m              General:                      Elderly, well-appearing             HEENT:                       Unremarkable, NCAT, PERLA, EOMI             Neck:                           no JVD, no bruits, no adenopathy              Chest:                          clear to auscultation, symmetrical breath sounds, no wheezes, no rhonchi              CV:  RRR, grade lll/VI crescendo/decrescendo murmur heard best at RSB,  no diastolic murmur             Abdomen:                    soft, non-tender, palpable AAA             Extremities:                 warm, well-perfused, pulses palpable at ankle, no LE edema             Rectal/GU                   Deferred             Neuro:                         Grossly non-focal and symmetrical throughout             Skin:                            Clean and dry, no  rashes, no breakdown   Diagnostic Tests:  ECHOCARDIOGRAM REPORT       Patient Name:  Brandon [name] Date of Exam: 03/24/2020  Medical Rec #: [id number]  Height:    68.0 in  Accession #:  [id number]  Weight:    172.0 lb  Date of Birth: [birth year]  BSA:     1.917 m  Patient Age:  89 years   BP:      141/61 mmHg  Patient Gender: M      HR:      65 bpm.  Exam Location: Outpatient   Procedure: 2D Echo, Cardiac Doppler and Color Doppler   Indications:  Congestive Heart Failure 428.0 / I50.9    History:    Patient has prior history of Echocardiogram examinations,  most         recent 07/18/2017. CAD, TIA, AS; Risk Factors:Hypertension  and         Dyslipidemia.    Sonographer:  Bernadene Person RDCS  Referring Phys: [place]    1. Left ventricular ejection fraction, by estimation, is 60 to 65%. The  left ventricle has normal function. The left ventricle has no regional  wall motion abnormalities. There is mild left ventricular hypertrophy.  Left ventricular diastolic parameters  are consistent with Grade II diastolic dysfunction (pseudonormalization).  2. Right ventricular systolic function is normal. The right ventricular  size is normal. There is mildly elevated pulmonary artery systolic  pressure. The estimated right ventricular systolic pressure is [id number] mmHg.  3. Left atrial size was moderately dilated.  4. The mitral valve is degenerative. No evidence of mitral valve  regurgitation. No evidence of mitral stenosis.  5. The aortic valve is tricuspid. Aortic valve regurgitation is mild.  Severe aortic valve stenosis. Aortic valve area, by VTI measures 0.96 cm.  Aortic valve mean gradient measures 50.0 mmHg.  6. Aortic dilatation noted. There is mild dilatation of the ascending  aorta, measuring 42 mm.  7. The inferior vena cava is normal in size with greater than 50%   respiratory variability, suggesting right atrial pressure of 3 mmHg.   FINDINGS  Left Ventricle: Left ventricular ejection fraction, by estimation, is 60  to 65%. The left ventricle has normal function. The left ventricle has no  regional wall motion abnormalities. The left ventricular internal cavity  size was normal in size. There is  mild left ventricular hypertrophy. Left ventricular diastolic parameters  are consistent with Grade II diastolic dysfunction (pseudonormalization).   Right Ventricle: The right ventricular size is normal. No increase in  right ventricular wall thickness. Right ventricular systolic function is  normal. There is mildly elevated pulmonary artery systolic pressure. The  tricuspid regurgitant velocity is 3.22  m/s, and with an assumed right atrial pressure of 3 mmHg, the estimated  right ventricular systolic pressure is [id number] mmHg.   Left Atrium: Left atrial size was moderately dilated.   Right Atrium: Right atrial size was normal in size.   Pericardium: There is no evidence of pericardial effusion.   Mitral Valve: The mitral valve is degenerative in appearance. Mild to  moderate mitral annular calcification. No evidence of mitral valve  regurgitation. No evidence of mitral valve stenosis.   Tricuspid Valve: The tricuspid valve is normal in structure. Tricuspid  valve regurgitation is trivial.   Aortic Valve: The aortic valve is tricuspid. Aortic valve regurgitation is  mild. Aortic regurgitation PHT measures 658 msec. Severe aortic stenosis  is present. Aortic valve mean gradient measures 50.0 mmHg. Aortic valve  peak gradient measures 70.4 mmHg.  Aortic valve area, by VTI measures 0.96 cm.   Pulmonic Valve: The pulmonic valve was normal in structure. Pulmonic valve  regurgitation is not visualized.   Aorta: The aortic root is normal in size and structure and aortic  dilatation noted. There is mild dilatation of the ascending aorta,   measuring 42 mm.   Venous: The inferior vena cava is normal in size with greater than 50%  respiratory variability, suggesting right atrial pressure of 3 mmHg.   IAS/Shunts: No atrial level shunt detected by color flow Doppler.     LEFT VENTRICLE  PLAX 2D  LVIDd:     5.50 cm Diastology  LVIDs:     4.10 cm LV e' medial:  5.87 cm/s  LV PW:     1.00 cm LV E/e' medial: 15.0  LV IVS:    1.10 cm LV e' lateral:  8.49 cm/s  LVOT diam:   2.00 cm LV E/e' lateral: 10.3  LV SV:     98  LV SV Index:  51  LVOT Area:   3.14 cm     RIGHT VENTRICLE  RV S prime:   12.20 cm/s  TAPSE (M-mode): 2.4 cm   LEFT ATRIUM       Index    RIGHT ATRIUM      Index  LA diam:    4.30 cm 2.24 cm/m RA Area:   11.60 cm  LA Vol (A2C):  88.5 ml 46.17 ml/m RA Volume:  18.60 ml 9.70 ml/m  LA Vol (A4C):  94.3 ml 49.20 ml/m  LA Biplane Vol: 97.0 ml 50.60 ml/m  AORTIC VALVE  AV Area (Vmax):  0.92 cm  AV Area (Vmean):  0.95 cm  AV Area (VTI):   0.96 cm  AV Vmax:      419.40 cm/s  AV Vmean:     319.400 cm/s  AV VTI:      1.029 m  AV Peak Grad:   70.4 mmHg  AV Mean Grad:   50.0 mmHg  LVOT Vmax:     123.00 cm/s  LVOT Vmean:    96.600 cm/s  LVOT VTI:     0.313 m  LVOT/AV VTI ratio: 0.30  AI PHT:  658 msec    AORTA  Ao Root diam: 3.50 cm  Ao Asc diam: 4.05 cm   MITRAL VALVE        TRICUSPID VALVE  MV Area (PHT): 3.53 cm  TR Peak grad:  41.5 mmHg  MV Decel Time: 215 msec  TR Vmax:    322.00 cm/s  MV E velocity: 87.80 cm/s  MV A velocity: 70.70 cm/s SHUNTS  MV E/A ratio: 1.24    Systemic VTI: 0.31 m               Systemic Diam: 2.00 cm   Loralie Champagne MD  Electronically signed by Loralie Champagne MD  Signature Date/Time: 03/24/2020/3:29:14 PM      Final    Physicians  Panel Physicians Referring Physician Case Authorizing Physician   Larey Dresser, MD (Primary)      Procedures  RIGHT HEART CATH AND CORONARY ANGIOGRAPHY   Conclusion  1. Low filling pressures.  2. Preserved cardiac output.  3. Known occluded RCA with robust collaterals from LAD.  4. 95% calcified proximal LAD stenosis (LAD provides RCA collaterals).  5. 60-70% proximal LCx stenosis.   Patient has known severe AS (from echo). I reviewed films with Drs Angelena Form and Burt Knack. PCI-TAVR would be an option, would likely need atherectomy to address LAD and then PCI LCx. SAVR-CABG would also be an option, and may be safer in the long term as any compromise to an LAD stent would affect both LAD and RCA territory. Patient will be seen by cardiac surgeon and get scans for TAVR planning.    Procedural Details  Technical Details Procedure: Right Heart Cath, Selective Coronary Angiography  Indication: Aortic stenosis.   Procedural Details: The right brachial and radial areas were prepped, draped, and anesthetized with 1% lidocaine. There was a pre-existing peripheral IV in the right brachial artery that was replaced with a 51F venous sheath. A Swan-Ganz catheter was used for the right heart catheterization. Standard protocol was followed for recording of right heart pressures and sampling of oxygen saturations. Fick cardiac output was calculated. The right radial artery was entered using modified Seldinger technique and a 43F sheath was placed. The patient received 3 mg IA verapamil and weight-based IV heparin. Standard Judkins catheters were used for selective coronary angiography. The aortic valve was not crossed, patient known to have severe AS from echo. There were no immediate procedural complications. The patient was transferred to the post catheterization recovery area for further monitoring.   Estimated blood loss <50 mL.   During this procedure medications were administered to achieve and maintain moderate conscious sedation while the  patient's heart rate, blood pressure, and oxygen saturation were continuously monitored and I was present face-to-face 100% of this time.   Medications (Filter: Administrations occurring from 1142 to 1300 on 03/31/20) (important) Continuous medications are totaled by the amount administered until 03/31/20 1300.    Heparin (Porcine) in NaCl 1000-0.9 UT/500ML-% SOLN (mL) Total volume:  500 mL  Date/Time Rate/Dose/Volume Action   03/31/20 1153 500 mL Given    Heparin (Porcine) in NaCl 1000-0.9 UT/500ML-% SOLN (mL) Total volume:  500 mL  Date/Time Rate/Dose/Volume Action   03/31/20 1153 500 mL Given    fentaNYL (SUBLIMAZE) injection (mcg) Total dose:  50 mcg  Date/Time Rate/Dose/Volume Action   03/31/20 1216 25 mcg Given   1229 25 mcg Given    midazolam (VERSED) injection (mg) Total dose:  2 mg  Date/Time Rate/Dose/Volume Action   03/31/20 1216 1 mg Given  1229 1 mg Given    lidocaine (PF) (XYLOCAINE) 1 % injection (mL) Total volume:  3 mL  Date/Time Rate/Dose/Volume Action   03/31/20 1225 1 mL Given   1230 2 mL Given    Radial Cocktail/Verapamil only (mL) Total volume:  10 mL  Date/Time Rate/Dose/Volume Action   03/31/20 1230 10 mL Given    heparin sodium (porcine) injection (Units) Total dose:  4,000 Units  Date/Time Rate/Dose/Volume Action   03/31/20 1233 4,000 Units Given    iohexol (OMNIPAQUE) 350 MG/ML injection (mL) Total volume:  45 mL  Date/Time Rate/Dose/Volume Action   03/31/20 1251 45 mL Given    Sedation Time  Sedation Time Physician-1: 34 minutes 42 seconds   Contrast  Medication Name Total Dose  iohexol (OMNIPAQUE) 350 MG/ML injection 45 mL    Radiation/Fluoro  Fluoro time: 4.7 (min) DAP: 32871 (mGycm2) Cumulative Air Kerma: 657 (mGy)   Coronary Findings   Diagnostic Dominance: Right  Left Main  20-30% distal left main narrowing.  Left Anterior Descending  95% calcified  proximal LAD stenosis. The LAD provides robust collaterals to the RCA. Luminal irregularities throughout the rest of the LAD.  Ramus Intermedius  60-70% stenosis proximal LCx.  Right Coronary Artery  Totally occluded proximal RCA (known from past cath). There are robust collaterals from the LAD to the RCA territory.   Intervention   No interventions have been documented.  Right Heart  Right Heart Pressures RHC Procedural Findings: Hemodynamics (mmHg) RA mean 2 RV 23/1 PA 28/1 PCWP mean 5 AO 121/52  Oxygen saturations: PA 74% AO 100%  Cardiac Output (Fick) 5.88  Cardiac Index (Fick) 3.08   Implants       No implant documentation for this case.   Syngo Images  Show images for CARDIAC CATHETERIZATION  Images on Long Term Storage  Show images for Hiroki, Hyppolite to Procedure Log  Procedure Log     Hemo Data  Flowsheet Row Most Recent Value  Fick Cardiac Output 5.88 L/min  Fick Cardiac Output Index 3.08 (L/min)/BSA  RA A Wave 7 mmHg  RA V Wave 5 mmHg  RA Mean 2 mmHg  RV Systolic Pressure 23 mmHg  RV Diastolic Pressure -4 mmHg  RV EDP 1 mmHg  PA Systolic Pressure 28 mmHg  PA Diastolic Pressure 1 mmHg  PA Mean 15 mmHg  PW A Wave 7 mmHg  PW V Wave 8 mmHg  PW Mean 5 mmHg  AO Systolic Pressure 123XX123 mmHg  AO Diastolic Pressure 52 mmHg  AO Mean 81 mmHg  QP/QS 1  TPVR Index 4.86 HRUI  TSVR Index 26.26 HRUI  PVR SVR Ratio 0.13  TPVR/TSVR Ratio 0.19   Addendum    ADDENDUM REPORT: 04/05/2020 13:29  CLINICAL DATA: 80 year old male with severe aortic stenosis being evaluated for a TAVR procedure.  EXAM: Cardiac TAVR CT  TECHNIQUE: The patient was scanned on a [employer]. A 120 kV retrospective scan was triggered in the descending thoracic aorta at 111 HU's. Gantry rotation speed was 250 msecs and collimation was .6 mm. 25 mg of PO Metoprolol or nitro were given. The 3D data set was reconstructed in 5%  intervals of the R-R cycle. Systolic and diastolic phases were analyzed on a dedicated work station using MPR, MIP and VRT modes. The patient received 80 cc of contrast.  FINDINGS: Aortic Valve: Tri-leaflet aortic valve with severely calcified and thickened leaflets, severely restricted leaflet opening and asymmetric calcifications extending into the LVOT under the  left coronary cusp.  Aorta: Normal size, moderate diffuse atherosclerotic plaque with calcifications.  Sinotubular Junction: 32 x 32 mm  Ascending Thoracic Aorta: 38 x 37 mm  Aortic Arch: 33 x 31 mm  Descending Thoracic Aorta: 27 x 25 mm  Sinus of Valsalva Measurements:  Non-coronary: 38 mm  Right -coronary: 38 mm  Left -coronary: 38 mm  Coronary Artery Height above Annulus:  Left Main: 16 mm  Right Coronary: 21 mm  Virtual Basal Annulus Measurements:  Maximum/Minimum Diameter: 29.8 x 24.4 mm  Mean Diameter: 26.9 mm  Perimeter: 86.6 mm  Area: 569 mm2  Optimum Fluoroscopic Angle for Delivery: LAO 5 CAU 4  IMPRESSION: 1. Tri-leaflet aortic valve with severely calcified and thickened leaflets, severely restricted leaflet opening and asymmetric calcifications extending into the LVOT under the left coronary cusp. Aortic valve calcium score is 5106 consistent with severe aortic stenosis. Annular measurements suitable for delivery of a 29 mm Edwards-SAPIEN 3 Ultra valve.  2. Sufficient coronary to annulus distance.  3. Optimum Fluoroscopic Angle for Delivery: LAO 5 CAU 4.  4. No thrombus in the left atrial appendage.   Electronically Signed By: Ena Dawley On: 04/05/2020 13:29     Narrative & Impression  CLINICAL DATA: 80 year old male with history of severe aortic stenosis. Preprocedural study prior to potential transcatheter aortic valve replacement (TAVR) procedure.  EXAM: CT ANGIOGRAPHY CHEST, ABDOMEN AND PELVIS  TECHNIQUE: Multidetector CT  imaging through the chest, abdomen and pelvis was performed using the standard protocol during bolus administration of intravenous contrast. Multiplanar reconstructed images and MIPs were obtained and reviewed to evaluate the vascular anatomy.  CONTRAST: 162mL OMNIPAQUE IOHEXOL 350 MG/ML SOLN  COMPARISON: Chest CT 05/29/2018. CT the abdomen and pelvis 02/26/2017.  FINDINGS: CTA CHEST FINDINGS  Cardiovascular: Heart size is normal. There is no significant pericardial fluid, thickening or pericardial calcification. There is aortic atherosclerosis, as well as atherosclerosis of the great vessels of the mediastinum and the coronary arteries, including calcified atherosclerotic plaque in the left main, left anterior descending, left circumflex and right coronary arteries. Severe thickening calcification of the aortic valve. Extensive calcifications of the mitral-aortic intervalvular fibrosa also noted.  Mediastinum/Lymph Nodes: No pathologically enlarged mediastinal or hilar lymph nodes. Esophagus is unremarkable in appearance. No axillary lymphadenopathy.  Lungs/Pleura: No suspicious appearing pulmonary nodules or masses are noted. No acute consolidative airspace disease. No pleural effusions.  Musculoskeletal/Soft Tissues: There are no aggressive appearing lytic or blastic lesions noted in the visualized portions of the skeleton.  CTA ABDOMEN AND PELVIS FINDINGS  Hepatobiliary: Liver has a nodular contour, concerning for underlying cirrhosis. No suspicious cystic or solid hepatic lesions. No intra or extrahepatic biliary ductal dilatation. Status post cholecystectomy.  Pancreas: No pancreatic mass. No pancreatic ductal dilatation. No pancreatic or peripancreatic fluid collections or inflammatory changes.  Spleen: Unremarkable.  Adrenals/Urinary Tract: Right kidney and bilateral adrenal glands are normal in appearance. Scarring in the lower pole of the  left kidney. No hydroureteronephrosis. Urinary bladder is normal in appearance.  Stomach/Bowel: Normal appearance of the stomach. No pathologic dilatation of small bowel or colon. Numerous colonic diverticulae are noted, particularly in the descending colon and sigmoid colon, without surrounding inflammatory changes to suggest an acute diverticulitis at this time. Normal appendix.  Vascular/Lymphatic: Aortic atherosclerosis with fusiform aneurysmal dilatation of the infrarenal abdominal aorta which measures up to 5.0 x 4.2 cm in diameter. Vascular findings and measurements pertinent to potential TAVR procedure, as detailed below. No lymphadenopathy noted in the abdomen or pelvis.  Reproductive:  Prostate gland and seminal vesicles are unremarkable in appearance.  Other: No significant volume of ascites. No pneumoperitoneum.  Musculoskeletal: Interbody cages at L4-L5. There are no aggressive appearing lytic or blastic lesions noted in the visualized portions of the skeleton.  VASCULAR MEASUREMENTS PERTINENT TO TAVR:  AORTA:  Minimal Aortic Diameter-11 x 9 mm  Severity of Aortic Calcification-severe  RIGHT PELVIS:  Right Common Iliac Artery -  Minimal Diameter-7.2 x 5.3 mm  Tortuosity-severe  Calcification-moderate to severe  Right External Iliac Artery -  Minimal Diameter-6.5 x 6.5 mm  Tortuosity-moderate  Calcification-moderate  Right Common Femoral Artery -  Minimal Diameter-6.4 x 7.3 mm  Tortuosity-mild  Calcification-moderate to severe  LEFT PELVIS:  Left Common Iliac Artery -  Minimal Diameter-7.2 x 5.9 mm  Tortuosity-severe  Calcification-moderate to severe  Left External Iliac Artery -  Minimal Diameter-6.8 x 7.0 mm  Tortuosity-moderate  Calcification-mild  Left Common Femoral Artery -  Minimal Diameter-8.6 x 8.4 mm  Tortuosity-mild  Calcification-moderate  Review of the MIP images confirms  the above findings.  IMPRESSION: 1. Vascular findings and measurements pertinent to potential TAVR procedure, as detailed above. 2. Severe thickening calcification of the aortic valve, compatible with reported clinical history of severe aortic stenosis. 3. Aortic atherosclerosis, in addition to left main and 3 vessel coronary artery disease. There is also a large infrarenal abdominal aortic aneurysm which is increasing in size, currently measuring 5.0 x 4.2 cm in diameter (previously 4.3 cm on 02/26/2017). Recommend referral to a vascular specialist. This recommendation follows ACR consensus guidelines: White Paper of the ACR Incidental Findings Committee II on Vascular Findings. J Am Coll Radiol 2013; 10:789-794. 4. Morphologic changes in the liver indicative of underlying cirrhosis. 5. Colonic diverticulosis without evidence of acute diverticulitis at this time. 6. Additional incidental findings, as above.   Electronically Signed By: Vinnie Langton M.D. On: 04/06/2020 08:00    Procedure: AVR + CAB  Risk of Mortality:  3.932%  Renal Failure:  2.742%  Permanent Stroke:  2.494%  Prolonged Ventilation:  11.115%  DSW Infection:  0.293%  Reoperation:  8.024%  Morbidity or Mortality:  21.483%  Short Length of Stay:  20.586%  Long Length of Stay:  13.072%    Impression:  This 80 year old gentleman has stage D, severe, symptomatic aortic stenosis with New York Heart Association class II symptoms of exertional fatigue and shortness of breath consistent with chronic diastolic congestive heart failure.  He is also having some exertional chest pressure.  I have personally reviewed his 2D echocardiogram, cardiac catheterization, and CTA studies.  His echocardiogram shows a trileaflet aortic valve with severe calcification and restricted leaflet mobility with a mean gradient of 50 mmHg and a valve area of 0.96 cm.  His left ventricular ejection fraction is 60 to  65% with grade 2 diastolic dysfunction.  Cardiac catheterization shows a known chronic occlusion of the RCA with brisk collaterals from the LAD.  The LAD has a 95% calcified proximal stenosis that is compromising the large LAD system as well as the collaterals to the RCA.  The left circumflex has a 60 to 70% long proximal stenosis.  Right heart pressures were normal with a normal cardiac output.  I agree that aortic valve replacement  is indicated in this patient to prevent progressive left ventricular deterioration and myocardial loss.  He underwent successful PCI and stenting of his LAD stenosis by Dr. Burt Knack.  His gated cardiac CTA shows anatomy suitable for transcatheter aortic valve replacement using a 29 mm Sapien  3 valve.  His abdominal and pelvic CTA shows a 5 cm infrarenal abdominal aortic aneurysm that is increased in size from 4.3 cm in January 2019.  He also has significant calcific atherosclerosis of the abdominal aorta and iliacs.  This would certainly increase the risk of transfemoral insertion.  His left subclavian artery appears to be of adequate size for insertion of a 16 French sheath and this would probably be the best option.  He already has an appointment set up to see Dr. Servando Snare for vascular surgery evaluation of his abdominal aortic aneurysm.  The patient and his wife were counseled at length regarding treatment alternatives for management of severe symptomatic aortic stenosis. The risks and benefits of surgical intervention has been discussed in detail. Long-term prognosis with medical therapy was discussed. Alternative approaches such as conventional surgical aortic valve replacement, transcatheter aortic valve replacement, and palliative medical therapy were compared and contrasted at length. This discussion was placed in the context of the patient's own specific clinical presentation and past medical history. All of their questions have been addressed.   Following the decision  to proceed with transcatheter aortic valve replacement, a discussion was held regarding what types of management strategies would be attempted intraoperatively in the event of life-threatening complications, including whether or not the patient would be considered a candidate for the use of cardiopulmonary bypass and/or conversion to open sternotomy for attempted surgical intervention.  I think he would be a candidate for emergent sternotomy to manage any intraoperative complications.  The patient is aware of the fact that transient use of cardiopulmonary bypass may be necessary. The patient has been advised of a variety of complications that might develop including but not limited to risks of death, stroke, paravalvular leak, aortic dissection or other major vascular complications, aortic annulus rupture, device embolization, cardiac rupture or perforation, mitral regurgitation, acute myocardial infarction, arrhythmia, heart block or bradycardia requiring permanent pacemaker placement, congestive heart failure, respiratory failure, renal failure, pneumonia, infection, other late complications related to structural valve deterioration or migration, or other complications that might ultimately cause a temporary or permanent loss of functional independence or other long term morbidity. The patient provides full informed consent for the procedure as described and all questions were answered.    Plan:  Transcatheter aortic valve replacement using a SAPIEN 3 valve, left subclavian insertion.    Gaye Pollack, MD

## 2020-06-07 ENCOUNTER — Inpatient Hospital Stay (HOSPITAL_COMMUNITY): Payer: Medicare Other | Admitting: Certified Registered"

## 2020-06-07 ENCOUNTER — Encounter (HOSPITAL_COMMUNITY): Payer: Self-pay | Admitting: Cardiovascular Disease

## 2020-06-07 ENCOUNTER — Inpatient Hospital Stay (HOSPITAL_COMMUNITY): Payer: Medicare Other

## 2020-06-07 ENCOUNTER — Inpatient Hospital Stay (HOSPITAL_COMMUNITY)
Admission: RE | Admit: 2020-06-07 | Discharge: 2020-06-08 | DRG: 267 | Disposition: A | Discharge status: Home / Self Care | Payer: Medicare Other | Attending: Surgery | Admitting: Surgery

## 2020-06-07 ENCOUNTER — Other Ambulatory Visit: Payer: Self-pay | Admitting: Physician Assistant

## 2020-06-07 ENCOUNTER — Encounter (HOSPITAL_COMMUNITY)
Admission: RE | Disposition: A | Discharge status: Home / Self Care | Payer: Medicare Other | Source: Home / Self Care | Attending: Surgery

## 2020-06-07 ENCOUNTER — Other Ambulatory Visit: Payer: Self-pay

## 2020-06-07 DIAGNOSIS — Z006 Encounter for examination for normal comparison and control in clinical research program: Secondary | ICD-10-CM

## 2020-06-07 DIAGNOSIS — F17210 Nicotine dependence, cigarettes, uncomplicated: Secondary | ICD-10-CM | POA: Diagnosis present

## 2020-06-07 DIAGNOSIS — Z923 Personal history of irradiation: Secondary | ICD-10-CM

## 2020-06-07 DIAGNOSIS — I25119 Atherosclerotic heart disease of native coronary artery with unspecified angina pectoris: Secondary | ICD-10-CM | POA: Diagnosis present

## 2020-06-07 DIAGNOSIS — L409 Psoriasis, unspecified: Secondary | ICD-10-CM | POA: Diagnosis present

## 2020-06-07 DIAGNOSIS — C090 Malignant neoplasm of tonsillar fossa: Secondary | ICD-10-CM | POA: Diagnosis present

## 2020-06-07 DIAGNOSIS — Z85818 Personal history of malignant neoplasm of other sites of lip, oral cavity, and pharynx: Secondary | ICD-10-CM | POA: Diagnosis not present

## 2020-06-07 DIAGNOSIS — Z8673 Personal history of transient ischemic attack (TIA), and cerebral infarction without residual deficits: Secondary | ICD-10-CM

## 2020-06-07 DIAGNOSIS — I110 Hypertensive heart disease with heart failure: Secondary | ICD-10-CM | POA: Diagnosis present

## 2020-06-07 DIAGNOSIS — I350 Nonrheumatic aortic (valve) stenosis: Secondary | ICD-10-CM

## 2020-06-07 DIAGNOSIS — K222 Esophageal obstruction: Secondary | ICD-10-CM

## 2020-06-07 DIAGNOSIS — I5032 Chronic diastolic (congestive) heart failure: Secondary | ICD-10-CM | POA: Diagnosis present

## 2020-06-07 DIAGNOSIS — F419 Anxiety disorder, unspecified: Secondary | ICD-10-CM | POA: Diagnosis present

## 2020-06-07 DIAGNOSIS — Z886 Allergy status to analgesic agent status: Secondary | ICD-10-CM | POA: Diagnosis not present

## 2020-06-07 DIAGNOSIS — E785 Hyperlipidemia, unspecified: Secondary | ICD-10-CM | POA: Diagnosis present

## 2020-06-07 DIAGNOSIS — Z7952 Long term (current) use of systemic steroids: Secondary | ICD-10-CM | POA: Diagnosis not present

## 2020-06-07 DIAGNOSIS — M545 Low back pain, unspecified: Secondary | ICD-10-CM | POA: Diagnosis present

## 2020-06-07 DIAGNOSIS — M353 Polymyalgia rheumatica: Secondary | ICD-10-CM | POA: Diagnosis present

## 2020-06-07 DIAGNOSIS — K219 Gastro-esophageal reflux disease without esophagitis: Secondary | ICD-10-CM | POA: Diagnosis present

## 2020-06-07 DIAGNOSIS — I2510 Atherosclerotic heart disease of native coronary artery without angina pectoris: Secondary | ICD-10-CM | POA: Diagnosis present

## 2020-06-07 DIAGNOSIS — E876 Hypokalemia: Secondary | ICD-10-CM | POA: Diagnosis present

## 2020-06-07 DIAGNOSIS — Z8546 Personal history of malignant neoplasm of prostate: Secondary | ICD-10-CM | POA: Diagnosis not present

## 2020-06-07 DIAGNOSIS — Z955 Presence of coronary angioplasty implant and graft: Secondary | ICD-10-CM

## 2020-06-07 DIAGNOSIS — Z20822 Contact with and (suspected) exposure to covid-19: Secondary | ICD-10-CM | POA: Diagnosis present

## 2020-06-07 DIAGNOSIS — Z952 Presence of prosthetic heart valve: Secondary | ICD-10-CM

## 2020-06-07 DIAGNOSIS — Z981 Arthrodesis status: Secondary | ICD-10-CM

## 2020-06-07 DIAGNOSIS — Z954 Presence of other heart-valve replacement: Secondary | ICD-10-CM | POA: Diagnosis not present

## 2020-06-07 DIAGNOSIS — I352 Nonrheumatic aortic (valve) stenosis with insufficiency: Secondary | ICD-10-CM | POA: Diagnosis present

## 2020-06-07 DIAGNOSIS — I10 Essential (primary) hypertension: Secondary | ICD-10-CM | POA: Diagnosis present

## 2020-06-07 DIAGNOSIS — I714 Abdominal aortic aneurysm, without rupture: Secondary | ICD-10-CM | POA: Diagnosis present

## 2020-06-07 DIAGNOSIS — N400 Enlarged prostate without lower urinary tract symptoms: Secondary | ICD-10-CM | POA: Diagnosis present

## 2020-06-07 DIAGNOSIS — I7143 Infrarenal abdominal aortic aneurysm, without rupture: Secondary | ICD-10-CM | POA: Diagnosis present

## 2020-06-07 DIAGNOSIS — Z8249 Family history of ischemic heart disease and other diseases of the circulatory system: Secondary | ICD-10-CM

## 2020-06-07 DIAGNOSIS — G459 Transient cerebral ischemic attack, unspecified: Secondary | ICD-10-CM | POA: Diagnosis present

## 2020-06-07 DIAGNOSIS — G894 Chronic pain syndrome: Secondary | ICD-10-CM | POA: Diagnosis present

## 2020-06-07 DIAGNOSIS — F32A Depression, unspecified: Secondary | ICD-10-CM | POA: Diagnosis present

## 2020-06-07 DIAGNOSIS — I700 Atherosclerosis of aorta: Secondary | ICD-10-CM | POA: Diagnosis present

## 2020-06-07 HISTORY — PX: ULTRASOUND GUIDANCE FOR VASCULAR ACCESS: SHX6516

## 2020-06-07 HISTORY — DX: Presence of prosthetic heart valve: Z95.2

## 2020-06-07 HISTORY — DX: Nonrheumatic aortic (valve) stenosis: I35.0

## 2020-06-07 HISTORY — PX: TEE WITHOUT CARDIOVERSION: SHX5443

## 2020-06-07 LAB — POCT I-STAT 7, (LYTES, BLD GAS, ICA,H+H)
Acid-Base Excess: 5 mmol/L — ABNORMAL HIGH (ref 0.0–2.0)
Acid-Base Excess: 1 mmol/L (ref 0.0–2.0)
Acid-Base Excess: 2 mmol/L (ref 0.0–2.0)
Bicarbonate: 30.4 mmol/L — ABNORMAL HIGH (ref 20.0–28.0)
Bicarbonate: 28.4 mmol/L — ABNORMAL HIGH (ref 20.0–28.0)
Bicarbonate: 28.7 mmol/L — ABNORMAL HIGH (ref 20.0–28.0)
Calcium, Ion: 1.18 mmol/L (ref 1.15–1.40)
Calcium, Ion: 1.29 mmol/L (ref 1.15–1.40)
Calcium, Ion: 1.37 mmol/L (ref 1.15–1.40)
HCT: 30 % — ABNORMAL LOW (ref 39.0–52.0)
HCT: 31 % — ABNORMAL LOW (ref 39.0–52.0)
HCT: 33 % — ABNORMAL LOW (ref 39.0–52.0)
Hemoglobin: 10.2 g/dL — ABNORMAL LOW (ref 13.0–17.0)
Hemoglobin: 10.5 g/dL — ABNORMAL LOW (ref 13.0–17.0)
Hemoglobin: 11.2 g/dL — ABNORMAL LOW (ref 13.0–17.0)
O2 Saturation: 100 %
O2 Saturation: 78 %
O2 Saturation: 99 %
Potassium: 3.2 mmol/L — ABNORMAL LOW (ref 3.5–5.1)
Potassium: 3.2 mmol/L — ABNORMAL LOW (ref 3.5–5.1)
Potassium: 3.2 mmol/L — ABNORMAL LOW (ref 3.5–5.1)
Sodium: 140 mmol/L (ref 135–145)
Sodium: 139 mmol/L (ref 135–145)
Sodium: 140 mmol/L (ref 135–145)
TCO2: 32 mmol/L (ref 22–32)
TCO2: 30 mmol/L (ref 22–32)
TCO2: 30 mmol/L (ref 22–32)
pCO2 arterial: 50.6 mmHg — ABNORMAL HIGH (ref 32.0–48.0)
pCO2 arterial: 52.3 mmHg — ABNORMAL HIGH (ref 32.0–48.0)
pCO2 arterial: 58.2 mmHg — ABNORMAL HIGH (ref 32.0–48.0)
pH, Arterial: 7.386 (ref 7.350–7.450)
pH, Arterial: 7.302 — ABNORMAL LOW (ref 7.350–7.450)
pH, Arterial: 7.342 — ABNORMAL LOW (ref 7.350–7.450)
pO2, Arterial: 172 mmHg — ABNORMAL HIGH (ref 83.0–108.0)
pO2, Arterial: 131 mmHg — ABNORMAL HIGH (ref 83.0–108.0)
pO2, Arterial: 45 mmHg — ABNORMAL LOW (ref 83.0–108.0)

## 2020-06-07 LAB — POCT I-STAT, CHEM 8
BUN: 9 mg/dL (ref 8–23)
BUN: 9 mg/dL (ref 8–23)
BUN: 10 mg/dL (ref 8–23)
BUN: 11 mg/dL (ref 8–23)
Calcium, Ion: 1.14 mmol/L — ABNORMAL LOW (ref 1.15–1.40)
Calcium, Ion: 1.16 mmol/L (ref 1.15–1.40)
Calcium, Ion: 1.23 mmol/L (ref 1.15–1.40)
Calcium, Ion: 1.27 mmol/L (ref 1.15–1.40)
Chloride: 100 mmol/L (ref 98–111)
Chloride: 100 mmol/L (ref 98–111)
Chloride: 101 mmol/L (ref 98–111)
Chloride: 103 mmol/L (ref 98–111)
Creatinine, Ser: 0.5 mg/dL — ABNORMAL LOW (ref 0.61–1.24)
Creatinine, Ser: 0.5 mg/dL — ABNORMAL LOW (ref 0.61–1.24)
Creatinine, Ser: 0.5 mg/dL — ABNORMAL LOW (ref 0.61–1.24)
Creatinine, Ser: 0.6 mg/dL — ABNORMAL LOW (ref 0.61–1.24)
Glucose, Bld: 108 mg/dL — ABNORMAL HIGH (ref 70–99)
Glucose, Bld: 132 mg/dL — ABNORMAL HIGH (ref 70–99)
Glucose, Bld: 150 mg/dL — ABNORMAL HIGH (ref 70–99)
Glucose, Bld: 152 mg/dL — ABNORMAL HIGH (ref 70–99)
HCT: 28 % — ABNORMAL LOW (ref 39.0–52.0)
HCT: 29 % — ABNORMAL LOW (ref 39.0–52.0)
HCT: 30 % — ABNORMAL LOW (ref 39.0–52.0)
HCT: 32 % — ABNORMAL LOW (ref 39.0–52.0)
Hemoglobin: 9.5 g/dL — ABNORMAL LOW (ref 13.0–17.0)
Hemoglobin: 9.9 g/dL — ABNORMAL LOW (ref 13.0–17.0)
Hemoglobin: 10.2 g/dL — ABNORMAL LOW (ref 13.0–17.0)
Hemoglobin: 10.9 g/dL — ABNORMAL LOW (ref 13.0–17.0)
Potassium: 3.1 mmol/L — ABNORMAL LOW (ref 3.5–5.1)
Potassium: 3.2 mmol/L — ABNORMAL LOW (ref 3.5–5.1)
Potassium: 3.1 mmol/L — ABNORMAL LOW (ref 3.5–5.1)
Potassium: 3.2 mmol/L — ABNORMAL LOW (ref 3.5–5.1)
Sodium: 139 mmol/L (ref 135–145)
Sodium: 140 mmol/L (ref 135–145)
Sodium: 141 mmol/L (ref 135–145)
Sodium: 142 mmol/L (ref 135–145)
TCO2: 29 mmol/L (ref 22–32)
TCO2: 29 mmol/L (ref 22–32)
TCO2: 27 mmol/L (ref 22–32)
TCO2: 27 mmol/L (ref 22–32)

## 2020-06-07 LAB — ECHO TEE
AR max vel: 1.88 cm2
AV Area VTI: 1.69 cm2
AV Area mean vel: 1.76 cm2
AV Mean grad: 27 mmHg
AV Peak grad: 43.4 mmHg
Ao pk vel: 3.29 m/s

## 2020-06-07 LAB — ABO/RH: ABO/RH(D): O POS

## 2020-06-07 SURGERY — IMPLANTATION, AORTIC VALVE, TRANSCATHETER, SUBCLAVIAN ARTERY APPROACH
Anesthesia: General | Site: Groin | Laterality: Right

## 2020-06-07 MED ORDER — TRAMADOL HCL 50 MG PO TABS
50.0000 mg | ORAL_TABLET | ORAL | Status: DC | PRN
Start: 2020-06-07 — End: 2020-06-08
  Administered 2020-06-07 – 2020-06-08 (×4): 100 mg via ORAL
  Filled 2020-06-07 (×4): qty 2

## 2020-06-07 MED ORDER — CHLORHEXIDINE GLUCONATE 4 % EX LIQD: 30.0000 mL | CUTANEOUS | Status: DC

## 2020-06-07 MED ORDER — NITROGLYCERIN IN D5W 200-5 MCG/ML-% IV SOLN
0.0000 ug/min | INTRAVENOUS | Status: DC
Start: 2020-06-07 — End: 2020-06-08

## 2020-06-07 MED ORDER — SODIUM CHLORIDE 0.9 % IV SOLN
INTRAVENOUS | Status: DC
Start: 2020-06-08 — End: 2020-06-07

## 2020-06-07 MED ORDER — SUGAMMADEX SODIUM 200 MG/2ML IV SOLN
INTRAVENOUS | Status: DC | PRN
  Administered 2020-06-07: 200 mg via INTRAVENOUS

## 2020-06-07 MED ORDER — HYDROCORTISONE NA SUCCINATE PF 1000 MG IJ SOLR
INTRAMUSCULAR | Status: DC | PRN
  Administered 2020-06-07: 100 mg via INTRAVENOUS

## 2020-06-07 MED ORDER — PROPOFOL 10 MG/ML IV BOLUS
INTRAVENOUS | Status: DC | PRN
  Administered 2020-06-07: 30 mg via INTRAVENOUS
  Administered 2020-06-07: 100 mg via INTRAVENOUS

## 2020-06-07 MED ORDER — PROPOFOL 10 MG/ML IV BOLUS
INTRAVENOUS | Status: AC
  Filled 2020-06-07: qty 20

## 2020-06-07 MED ORDER — HEPARIN SODIUM (PORCINE) 1000 UNIT/ML IJ SOLN
INTRAMUSCULAR | Status: DC | PRN
  Administered 2020-06-07: 12000 [IU] via INTRAVENOUS

## 2020-06-07 MED ORDER — SODIUM CHLORIDE 0.9 % IV SOLN: 250.0000 mL | INTRAVENOUS | Status: DC | PRN

## 2020-06-07 MED ORDER — SODIUM CHLORIDE 0.9 % IV SOLN
INTRAVENOUS | Status: DC | PRN
  Administered 2020-06-07 (×3): 500 mL

## 2020-06-07 MED ORDER — SODIUM CHLORIDE 0.9 % IV SOLN
1.5000 g | Freq: Two times a day (BID) | INTRAVENOUS | Status: DC
  Filled 2020-06-07 (×2): qty 1.5

## 2020-06-07 MED ORDER — SODIUM CHLORIDE 0.9 % IV SOLN: INTRAVENOUS | Status: AC

## 2020-06-07 MED ORDER — ALBUTEROL SULFATE HFA 108 (90 BASE) MCG/ACT IN AERS
INHALATION_SPRAY | RESPIRATORY_TRACT | Status: DC | PRN
  Administered 2020-06-07: 4 via RESPIRATORY_TRACT

## 2020-06-07 MED ORDER — LIDOCAINE 2% (20 MG/ML) 5 ML SYRINGE
INTRAMUSCULAR | Status: DC | PRN
  Administered 2020-06-07: 40 mg via INTRAVENOUS

## 2020-06-07 MED ORDER — ATROPINE SULFATE 0.4 MG/ML IJ SOLN
INTRAMUSCULAR | Status: DC | PRN
  Administered 2020-06-07: .2 mg via INTRAVENOUS

## 2020-06-07 MED ORDER — 0.9 % SODIUM CHLORIDE (POUR BTL) OPTIME
TOPICAL | Status: DC | PRN
  Administered 2020-06-07 (×2): 1000 mL

## 2020-06-07 MED ORDER — CHLORHEXIDINE GLUCONATE 4 % EX LIQD: 60.0000 mL | Freq: Once | CUTANEOUS | Status: DC

## 2020-06-07 MED ORDER — PHENYLEPHRINE 40 MCG/ML (10ML) SYRINGE FOR IV PUSH (FOR BLOOD PRESSURE SUPPORT)
PREFILLED_SYRINGE | INTRAVENOUS | Status: DC | PRN
  Administered 2020-06-07 (×2): 240 ug via INTRAVENOUS

## 2020-06-07 MED ORDER — ATORVASTATIN CALCIUM 80 MG PO TABS
80.0000 mg | ORAL_TABLET | Freq: Every day | ORAL | Status: DC
  Administered 2020-06-07 – 2020-06-08 (×2): 80 mg via ORAL
  Filled 2020-06-07 (×3): qty 1

## 2020-06-07 MED ORDER — ONDANSETRON HCL 4 MG/2ML IJ SOLN
INTRAMUSCULAR | Status: DC | PRN
  Administered 2020-06-07: 4 mg via INTRAVENOUS

## 2020-06-07 MED ORDER — OXYCODONE HCL 5 MG PO TABS
5.0000 mg | ORAL_TABLET | ORAL | Status: DC | PRN
Start: 2020-06-07 — End: 2020-06-08
  Administered 2020-06-07 – 2020-06-08 (×7): 10 mg via ORAL
  Filled 2020-06-07 (×7): qty 2

## 2020-06-07 MED ORDER — FLUTICASONE PROPIONATE 50 MCG/ACT NA SUSP
2.0000 | Freq: Every day | NASAL | Status: DC | PRN
  Filled 2020-06-07: qty 16

## 2020-06-07 MED ORDER — ACETAMINOPHEN 325 MG PO TABS: 650.0000 mg | ORAL_TABLET | Freq: Four times a day (QID) | ORAL | Status: DC | PRN

## 2020-06-07 MED ORDER — CHLORHEXIDINE GLUCONATE 0.12 % MT SOLN
15.0000 mL | Freq: Once | OROMUCOSAL | Status: AC
Start: 2020-06-08 — End: 2020-06-07
  Administered 2020-06-07: 15 mL via OROMUCOSAL
  Filled 2020-06-07: qty 15

## 2020-06-07 MED ORDER — LACTATED RINGERS IV SOLN: INTRAVENOUS | Status: DC | PRN

## 2020-06-07 MED ORDER — PROTAMINE SULFATE 10 MG/ML IV SOLN
INTRAVENOUS | Status: DC | PRN
  Administered 2020-06-07: 10 mg via INTRAVENOUS
  Administered 2020-06-07: 110 mg via INTRAVENOUS

## 2020-06-07 MED ORDER — FENTANYL CITRATE (PF) 250 MCG/5ML IJ SOLN
INTRAMUSCULAR | Status: AC
  Filled 2020-06-07: qty 5

## 2020-06-07 MED ORDER — ACETAMINOPHEN 10 MG/ML IV SOLN
INTRAVENOUS | Status: AC
  Administered 2020-06-07: 1000 mg
  Filled 2020-06-07: qty 100

## 2020-06-07 MED ORDER — ACETAMINOPHEN 650 MG RE SUPP: 650.0000 mg | Freq: Four times a day (QID) | RECTAL | Status: DC | PRN

## 2020-06-07 MED ORDER — ONDANSETRON HCL 4 MG/2ML IJ SOLN: 4.0000 mg | Freq: Four times a day (QID) | INTRAMUSCULAR | Status: DC | PRN

## 2020-06-07 MED ORDER — CLOPIDOGREL BISULFATE 75 MG PO TABS
75.0000 mg | ORAL_TABLET | Freq: Every day | ORAL | Status: DC
  Administered 2020-06-07 – 2020-06-08 (×2): 75 mg via ORAL
  Filled 2020-06-07 (×2): qty 1

## 2020-06-07 MED ORDER — PREDNISONE 5 MG PO TABS: 5.0000 mg | ORAL_TABLET | Freq: Every day | ORAL | Status: DC

## 2020-06-07 MED ORDER — ALBUMIN HUMAN 5 % IV SOLN: INTRAVENOUS | Status: DC | PRN

## 2020-06-07 MED ORDER — CALCIUM CHLORIDE 10 % IV SOLN
INTRAVENOUS | Status: DC | PRN
  Administered 2020-06-07: 100 mg via INTRAVENOUS
  Administered 2020-06-07 (×2): 200 mg via INTRAVENOUS

## 2020-06-07 MED ORDER — POTASSIUM CHLORIDE 10 MEQ/100ML IV SOLN
10.0000 meq | INTRAVENOUS | Status: AC
  Administered 2020-06-07 (×3): 10 meq via INTRAVENOUS
  Filled 2020-06-07 (×3): qty 100

## 2020-06-07 MED ORDER — FENTANYL CITRATE (PF) 250 MCG/5ML IJ SOLN
INTRAMUSCULAR | Status: DC | PRN
  Administered 2020-06-07: 50 ug via INTRAVENOUS
  Administered 2020-06-07 (×2): 25 ug via INTRAVENOUS
  Administered 2020-06-07: 50 ug via INTRAVENOUS

## 2020-06-07 MED ORDER — SODIUM CHLORIDE 0.9% FLUSH
3.0000 mL | Freq: Two times a day (BID) | INTRAVENOUS | Status: DC
  Administered 2020-06-07 – 2020-06-08 (×2): 3 mL via INTRAVENOUS

## 2020-06-07 MED ORDER — EZETIMIBE 10 MG PO TABS
10.0000 mg | ORAL_TABLET | Freq: Every day | ORAL | Status: DC
  Administered 2020-06-07 – 2020-06-08 (×2): 10 mg via ORAL
  Filled 2020-06-07 (×2): qty 1

## 2020-06-07 MED ORDER — CEFAZOLIN SODIUM-DEXTROSE 2-4 GM/100ML-% IV SOLN
2.0000 g | Freq: Three times a day (TID) | INTRAVENOUS | Status: DC
  Administered 2020-06-07 – 2020-06-08 (×2): 2 g via INTRAVENOUS
  Filled 2020-06-07 (×4): qty 100

## 2020-06-07 MED ORDER — TAMSULOSIN HCL 0.4 MG PO CAPS
0.4000 mg | ORAL_CAPSULE | Freq: Every day | ORAL | Status: DC
  Administered 2020-06-07: 0.4 mg via ORAL
  Filled 2020-06-07: qty 1

## 2020-06-07 MED ORDER — ASPIRIN EC 81 MG PO TBEC
81.0000 mg | DELAYED_RELEASE_TABLET | Freq: Every day | ORAL | Status: DC
  Administered 2020-06-07: 81 mg via ORAL
  Filled 2020-06-07 (×3): qty 1

## 2020-06-07 MED ORDER — CITALOPRAM HYDROBROMIDE 20 MG PO TABS
20.0000 mg | ORAL_TABLET | Freq: Every day | ORAL | Status: DC
  Administered 2020-06-07 – 2020-06-08 (×2): 20 mg via ORAL
  Filled 2020-06-07 (×2): qty 1

## 2020-06-07 MED ORDER — PREDNISONE 1 MG PO TABS
7.0000 mg | ORAL_TABLET | Freq: Every morning | ORAL | Status: DC
  Filled 2020-06-07: qty 7

## 2020-06-07 MED ORDER — ROCURONIUM BROMIDE 10 MG/ML (PF) SYRINGE
PREFILLED_SYRINGE | INTRAVENOUS | Status: DC | PRN
  Administered 2020-06-07: 20 mg via INTRAVENOUS
  Administered 2020-06-07: 60 mg via INTRAVENOUS

## 2020-06-07 MED ORDER — PHENYLEPHRINE HCL-NACL 10-0.9 MG/250ML-% IV SOLN
0.0000 ug/min | INTRAVENOUS | Status: DC
Start: 2020-06-07 — End: 2020-06-07
  Filled 2020-06-07: qty 250

## 2020-06-07 MED ORDER — SODIUM CHLORIDE 0.9% FLUSH: 3.0000 mL | INTRAVENOUS | Status: DC | PRN

## 2020-06-07 MED ORDER — DEXMEDETOMIDINE HCL IN NACL 80 MCG/20ML IV SOLN
INTRAVENOUS | Status: AC
  Filled 2020-06-07: qty 20

## 2020-06-07 MED ORDER — PANTOPRAZOLE SODIUM 40 MG PO TBEC
40.0000 mg | DELAYED_RELEASE_TABLET | Freq: Every day | ORAL | Status: DC
  Administered 2020-06-07 – 2020-06-08 (×2): 40 mg via ORAL
  Filled 2020-06-07 (×2): qty 1

## 2020-06-07 MED ORDER — VANCOMYCIN HCL IN DEXTROSE 1-5 GM/200ML-% IV SOLN
1000.0000 mg | Freq: Once | INTRAVENOUS | Status: AC
  Administered 2020-06-07: 1000 mg via INTRAVENOUS
  Filled 2020-06-07: qty 200

## 2020-06-07 MED ORDER — IODIXANOL 320 MG/ML IV SOLN
INTRAVENOUS | Status: DC | PRN
  Administered 2020-06-07: 40 mL via INTRA_ARTERIAL

## 2020-06-07 MED ORDER — MORPHINE SULFATE (PF) 2 MG/ML IV SOLN
1.0000 mg | INTRAVENOUS | Status: DC | PRN
Start: 2020-06-07 — End: 2020-06-08

## 2020-06-07 MED ORDER — PREDNISONE 5 MG PO TABS
7.0000 mg | ORAL_TABLET | Freq: Every day | ORAL | Status: DC
  Administered 2020-06-08: 7 mg via ORAL
  Filled 2020-06-07: qty 2

## 2020-06-07 MED ORDER — GABAPENTIN 300 MG PO CAPS
300.0000 mg | ORAL_CAPSULE | Freq: Three times a day (TID) | ORAL | Status: DC
  Administered 2020-06-07 – 2020-06-08 (×3): 300 mg via ORAL
  Filled 2020-06-07 (×3): qty 1

## 2020-06-07 SURGICAL SUPPLY — 93 items
ADH SKN CLS APL DERMABOND .7 (GAUZE/BANDAGES/DRESSINGS) ×3
BAG DECANTER FOR FLEXI CONT (MISCELLANEOUS) IMPLANT
BAG SNAP BAND KOVER 36X36 (MISCELLANEOUS) ×4 IMPLANT
BLADE CLIPPER SURG (BLADE) IMPLANT
BLADE OSCILLATING /SAGITTAL (BLADE) IMPLANT
BLADE STERNUM SYSTEM 6 (BLADE) IMPLANT
CABLE ADAPT CONN TEMP 6FT (ADAPTER) ×4 IMPLANT
CANNULA FEM VENOUS REMOTE 22FR (CANNULA) IMPLANT
CANNULA OPTISITE PERFUSION 16F (CANNULA) IMPLANT
CANNULA OPTISITE PERFUSION 18F (CANNULA) IMPLANT
CATH DIAG EXPO 6F AL1 (CATHETERS) IMPLANT
CATH DIAG EXPO 6F VENT PIG 145 (CATHETERS) ×8 IMPLANT
CATH EXTERNAL FEMALE PUREWICK (CATHETERS) IMPLANT
CATH INFINITI 6F AL2 (CATHETERS) ×1 IMPLANT
CATH S G BIP PACING (CATHETERS) ×4 IMPLANT
CLIP VESOCCLUDE MED 24/CT (CLIP) IMPLANT
CLIP VESOCCLUDE SM WIDE 24/CT (CLIP) IMPLANT
CLOSURE MYNX CONTROL 6F/7F (Vascular Products) ×1 IMPLANT
CNTNR URN SCR LID CUP LEK RST (MISCELLANEOUS) ×6 IMPLANT
CONT SPEC 4OZ STRL OR WHT (MISCELLANEOUS) ×8
COVER BACK TABLE 24X17X13 BIG (DRAPES) IMPLANT
COVER BACK TABLE 80X110 HD (DRAPES) ×8 IMPLANT
COVER DOME SNAP 22 D (MISCELLANEOUS) IMPLANT
COVER WAND RF STERILE (DRAPES) ×4 IMPLANT
DERMABOND ADVANCED (GAUZE/BANDAGES/DRESSINGS) ×1
DERMABOND ADVANCED .7 DNX12 (GAUZE/BANDAGES/DRESSINGS) ×3 IMPLANT
DEVICE CLOSURE PERCLS PRGLD 6F (VASCULAR PRODUCTS) ×6 IMPLANT
DRAPE INCISE IOBAN 66X45 STRL (DRAPES) IMPLANT
DRSG TEGADERM 4X4.75 (GAUZE/BANDAGES/DRESSINGS) ×8 IMPLANT
ELECT CAUTERY BLADE 6.4 (BLADE) IMPLANT
ELECT REM PT RETURN 9FT ADLT (ELECTROSURGICAL) ×8
ELECTRODE REM PT RTRN 9FT ADLT (ELECTROSURGICAL) ×6 IMPLANT
FELT TEFLON 6X6 (MISCELLANEOUS) IMPLANT
FEMORAL VENOUS CANN RAP (CANNULA) IMPLANT
GAUZE SPONGE 4X4 12PLY STRL (GAUZE/BANDAGES/DRESSINGS) ×4 IMPLANT
GLOVE BIO SURGEON STRL SZ7.5 (GLOVE) IMPLANT
GLOVE BIO SURGEON STRL SZ8 (GLOVE) IMPLANT
GLOVE EUDERMIC 7 POWDERFREE (GLOVE) IMPLANT
GLOVE ORTHO TXT STRL SZ7.5 (GLOVE) IMPLANT
GOWN STRL REUS W/ TWL LRG LVL3 (GOWN DISPOSABLE) IMPLANT
GOWN STRL REUS W/ TWL XL LVL3 (GOWN DISPOSABLE) ×3 IMPLANT
GOWN STRL REUS W/TWL LRG LVL3 (GOWN DISPOSABLE)
GOWN STRL REUS W/TWL XL LVL3 (GOWN DISPOSABLE) ×4
GUIDEWIRE SAFE TJ AMPLATZ EXST (WIRE) ×4 IMPLANT
INSERT FOGARTY SM (MISCELLANEOUS) IMPLANT
KIT BASIN OR (CUSTOM PROCEDURE TRAY) ×4 IMPLANT
KIT DILATOR VASC 18G NDL (KITS) IMPLANT
KIT HEART LEFT (KITS) ×4 IMPLANT
KIT SUCTION CATH 14FR (SUCTIONS) IMPLANT
KIT TURNOVER KIT B (KITS) ×4 IMPLANT
LOOP VESSEL MAXI BLUE (MISCELLANEOUS) IMPLANT
LOOP VESSEL MINI RED (MISCELLANEOUS) IMPLANT
NDL PERC 18GX7CM (NEEDLE) ×3 IMPLANT
NEEDLE 22X1 1/2 (OR ONLY) (NEEDLE) IMPLANT
NEEDLE PERC 18GX7CM (NEEDLE) ×4 IMPLANT
NS IRRIG 1000ML POUR BTL (IV SOLUTION) ×12 IMPLANT
PACK ENDOVASCULAR (PACKS) ×4 IMPLANT
PAD ARMBOARD 7.5X6 YLW CONV (MISCELLANEOUS) ×8 IMPLANT
PAD ELECT DEFIB RADIOL ZOLL (MISCELLANEOUS) ×4 IMPLANT
PENCIL BUTTON HOLSTER BLD 10FT (ELECTRODE) ×4 IMPLANT
PERCLOSE PROGLIDE 6F (VASCULAR PRODUCTS) ×8
POSITIONER HEAD DONUT 9IN (MISCELLANEOUS) ×4 IMPLANT
SET MICROPUNCTURE 5F STIFF (MISCELLANEOUS) ×4 IMPLANT
SHEATH BRITE TIP 6FR 35CM (SHEATH) ×5 IMPLANT
SHEATH PINNACLE 6F 10CM (SHEATH) ×4 IMPLANT
SHEATH PINNACLE 8F 10CM (SHEATH) ×4 IMPLANT
SLEEVE REPOSITIONING LENGTH 30 (MISCELLANEOUS) ×4 IMPLANT
SPONGE LAP 4X18 RFD (DISPOSABLE) IMPLANT
STOPCOCK MORSE 400PSI 3WAY (MISCELLANEOUS) ×8 IMPLANT
SUT ETHIBOND X763 2 0 SH 1 (SUTURE) IMPLANT
SUT GORETEX CV 4 TH 22 36 (SUTURE) IMPLANT
SUT GORETEX CV4 TH-18 (SUTURE) IMPLANT
SUT MNCRL AB 3-0 PS2 18 (SUTURE) IMPLANT
SUT PROLENE 5 0 C 1 36 (SUTURE) IMPLANT
SUT PROLENE 6 0 C 1 24 (SUTURE) ×1 IMPLANT
SUT PROLENE 6 0 C 1 30 (SUTURE) IMPLANT
SUT SILK  1 MH (SUTURE) ×4
SUT SILK 1 MH (SUTURE) ×3 IMPLANT
SUT SILK 3 0 SH CR/8 (SUTURE) ×1 IMPLANT
SUT VIC AB 2-0 CT1 27 (SUTURE)
SUT VIC AB 2-0 CT1 TAPERPNT 27 (SUTURE) IMPLANT
SUT VIC AB 2-0 CTX 36 (SUTURE) IMPLANT
SUT VIC AB 3-0 SH 8-18 (SUTURE) IMPLANT
SYR 50ML LL SCALE MARK (SYRINGE) ×4 IMPLANT
SYR BULB IRRIG 60ML STRL (SYRINGE) IMPLANT
SYR CONTROL 10ML LL (SYRINGE) IMPLANT
TOWEL GREEN STERILE (TOWEL DISPOSABLE) ×4 IMPLANT
TOWEL GREEN STERILE FF (TOWEL DISPOSABLE) ×4 IMPLANT
TRANSDUCER W/STOPCOCK (MISCELLANEOUS) ×8 IMPLANT
TRAY FOLEY SLVR 16FR TEMP STAT (SET/KITS/TRAYS/PACK) IMPLANT
VALVE 23 ULTRA SAPIEN KIT (Valve) ×1 IMPLANT
WIRE EMERALD 3MM-J .035X150CM (WIRE) ×4 IMPLANT
WIRE EMERALD 3MM-J .035X260CM (WIRE) ×4 IMPLANT

## 2020-06-07 NOTE — Progress Notes (Signed)
Patient ID: Brandon [name], male   DOB: Dec 27, 1940, 81 y.o.   MRN: [id number] TCTS:  Hemodynamically stable in sinus rhythm. He feels better this evening. Pain under control. Ambulated.  Dressing dry.  Groin sites ok.

## 2020-06-07 NOTE — Progress Notes (Signed)
  Echocardiogram Echocardiogram Transesophageal has been performed.  [name] 06/07/2020, 10:11 AM

## 2020-06-07 NOTE — Progress Notes (Signed)
Pt arrived to 4east from cath lab. Pt oriented to room and staff. CHG bath done. Left subclavian and right groin access sites level 0. Dressings clean and dry. Pt requesting pain medication for chronic left shoulder pain. Pharmacy notified. Wife at bedside.

## 2020-06-07 NOTE — Progress Notes (Signed)
  [place] VALVE TEAM  Patient doing well s/p TAVR. He is hemodynamically stable but Bp is soft. Groin/subclavian sites stable. ECG with new LBBB but no high grade block. Had a lot of shoulder pain which may be d/t brachial plexus irritation. Anesthesia treated with presedex and IV tyelenol due to soft Bps. Will supplement K. If BP normalizes, will pull art line and transfer to 4E. Plan for early ambulation after bedrest completed and hopeful discharge over the next 24-48 hours.   Angelena Form PA-C  [facility]  Pager [phone]

## 2020-06-07 NOTE — Progress Notes (Signed)
Mobility Specialist: Progress Note   06/07/20 1535  Mobility  Activity Ambulated in hall  Level of Assistance Standby assist, set-up cues, supervision of patient - no hands on  Assistive Device Front wheel walker  Distance Ambulated (ft) 470 ft  Mobility Response Tolerated well  Mobility performed by Mobility specialist  $Mobility charge 1 Mobility   Pre-Mobility on 3 L/min [place]: 72 HR, 99% SpO2 During Mobility on 2 L/min : 79 HR, 99% SpO2 Post-Mobility on RA: 81 HR, 127/79 BP, 95% SpO2  Pt c/o pain in his L shoulder during ambulation that he said has occurred since before his surgery. Pt otherwise asx. Pt back to bed after walk with call bell at his side.   [facility] Nivea Wojdyla Mobility Specialist Mobility Specialist Phone: [phone]

## 2020-06-07 NOTE — Anesthesia Preprocedure Evaluation (Signed)
Anesthesia Evaluation  Patient identified by MRN, date of birth, ID band Patient awake    Reviewed: Allergy & Precautions, NPO status , Patient's Chart, lab work & pertinent test results  Airway Mallampati: III  TM Distance: >3 FB Neck ROM: Limited    Dental  (+) Edentulous Upper, Edentulous Lower   Pulmonary former smoker,    + rhonchi        Cardiovascular hypertension,  Rhythm:Regular Rate:Normal     Neuro/Psych    GI/Hepatic   Endo/Other    Renal/GU      Musculoskeletal   Abdominal   Peds  Hematology   Anesthesia Other Findings   Reproductive/Obstetrics                             Anesthesia Physical Anesthesia Plan  ASA: III  Anesthesia Plan: General   Post-op Pain Management:    Induction: Intravenous  PONV Risk Score and Plan: Ondansetron and Dexamethasone  Airway Management Planned: Oral ETT  Additional Equipment: Arterial line  Intra-op Plan:   Post-operative Plan: Extubation in OR  Informed Consent: I have reviewed the patients History and Physical, chart, labs and discussed the procedure including the risks, benefits and alternatives for the proposed anesthesia with the patient or authorized representative who has indicated his/her understanding and acceptance.       Plan Discussed with: CRNA and Anesthesiologist  Anesthesia Plan Comments:         Anesthesia Quick Evaluation

## 2020-06-07 NOTE — Op Note (Signed)
[facility]   MULTIDISCIPLINARY HEART VALVE TEAM   TAVR OPERATIVE NOTE   Date of Procedure:  06/07/2020  Preoperative Diagnosis: Severe Aortic Stenosis   Postoperative Diagnosis: Same   Procedure:    Transcatheter Aortic Valve Replacement - Left transaxillary approach  Edwards Sapien 3 THV (size 29 mm, model # [id number])   Co-Surgeons:  Gaye Pollack, MD and Sherren Mocha, MD  Anesthesiologist:  Roberts Gaudy, MD  Echocardiographer:  Jenkins Rouge, MD  Pre-operative Echo Findings:  Severe aortic stenosis with mild aortic insufficiency  Normal left ventricular systolic function  Post-operative Echo Findings:  trivial paravalvular leak  Normal/unchanged left ventricular systolic function  BRIEF CLINICAL NOTE AND INDICATIONS FOR SURGERY  80 yo male with progressive, severe stage D1 aortic stenosis presents for TAVR. A left subclavian approach is chosen because of the presence of an infrarenal abdominal aortic aneurysm. He has recently undergone atherectomy and PCI of the LAD and has a CTO of the RCA managed medically with a well formed epicardial collateral supplied by the apical LAD. He continues to experience NYHA functional class III symptoms of chronic dyspnea even after PCI.   During the course of the patient's preoperative work up they have been evaluated comprehensively by a multidisciplinary team of specialists coordinated through the [facility] in the [facility].  They have been demonstrated to suffer from symptomatic severe aortic stenosis as noted above. The patient has been counseled extensively as to the relative risks and benefits of all options for the treatment of severe aortic stenosis including long term medical therapy, conventional surgery for aortic valve replacement, and transcatheter aortic valve replacement.  The patient has been independently evaluated in formal cardiac surgical  consultation by Dr Cyndia Bent, who deemed the patient appropriate for TAVR. Based upon review of all of the patient's preoperative diagnostic tests they are felt to be candidate for transcatheter aortic valve replacement using the transaxillary approach as an alternative to conventional surgery.    Following the decision to proceed with transcatheter aortic valve replacement, a discussion has been held regarding what types of management strategies would be attempted intraoperatively in the event of life-threatening complications, including whether or not the patient would be considered a candidate for the use of cardiopulmonary bypass and/or conversion to open sternotomy for attempted surgical intervention.  The patient has been advised of a variety of complications that might develop peculiar to this approach including but not limited to risks of death, stroke, paravalvular leak, aortic dissection or other major vascular complications, aortic annulus rupture, device embolization, cardiac rupture or perforation, acute myocardial infarction, arrhythmia, heart block or bradycardia requiring permanent pacemaker placement, congestive heart failure, respiratory failure, renal failure, pneumonia, infection, other late complications related to structural valve deterioration or migration, or other complications that might ultimately cause a temporary or permanent loss of functional independence or other long term morbidity.  The patient provides full informed consent for the procedure as described and all questions were answered preoperatively.  DETAILS OF THE OPERATIVE PROCEDURE  PREPARATION:   The patient is brought to the operating room on the above mentioned date and central monitoring was established by the anesthesia team including placement of a radial arterial line. The patient is placed in the supine position on the operating table.  Intravenous antibiotics are administered. General endotracheal anesthesia is  induced uneventfully.  Baseline transesophageal echocardiogram is performed. The patient's chest, abdomen, both groins, and both lower extremities are prepared and draped  in a sterile manner. A time out procedure is performed.   PERIPHERAL ACCESS:   Using ultrasound guidance, femoral arterial and venous access is obtained with placement of 6 Fr sheaths on the right side.  A pigtail diagnostic catheter was passed through the femoral arterial sheath under fluoroscopic guidance into the aortic root.  A temporary transvenous pacemaker catheter was passed through the femoral venous sheath under fluoroscopic guidance into the right ventricle.  The pacemaker was tested to ensure stable lead placement and pacemaker capture. Aortic root angiography was performed in order to determine the optimal angiographic angle for valve deployment.  TRANSAXILLARY ACCESS:  Please see the complete note of Dr Cyndia Bent.     BALLOON AORTIC VALVULOPLASTY:  Not performed  TRANSCATHETER HEART VALVE DEPLOYMENT:  After placement of an 8 Fr sheath in the left subclavian artery, an AL-2 catheter is used to direct a straight tip wire across the aortic valve. This is changed out over a pigtail catheter for an extra-stiff wire positioned in the LV apex. A 16 Fr E-sheath is inserted over this wire into the ascending aorta beyond the left subclavian ostium. An Edwards Sapien 3 transcatheter heart valve (size 29 mm) was prepared and crimped per manufacturer's guidelines, and the proper orientation of the valve is confirmed on the [employer] delivery system. The valve was advanced through the introducer sheath using normal technique until in an appropriate position in the ascending aorta beyond the sheath tip. The balloon was then retracted and using the fine-tuning wheel was centered on the valve.  The valve was carefully positioned across the aortic valve annulus. The Commander catheter was retracted using normal technique. Once final  position of the valve has been confirmed by angiographic assessment, the valve is deployed while temporarily holding ventilation and during rapid ventricular pacing to maintain systolic blood pressure < 50 mmHg and pulse pressure < 10 mmHg. The balloon inflation is held for >3 seconds after reaching full deployment volume. Once the balloon has fully deflated the balloon is retracted into the ascending aorta and valve function is assessed using echocardiography. The patient's hemodynamic recovery following valve deployment is slow.  He is supported with volume resuscitation and inotropic therapy. LV function is vigorous and there is no pericardidal effusion by TEE assessment. The patient's O2 saturation improves over time and stabilizes at > 95% and BP also stabilizes with the above measures with MAP > 65 mmHg. The deployment balloon and guidewire are both removed. Echo demostrated acceptable post-procedural gradients, stable mitral valve function, and trace aortic insufficiency.    PROCEDURE COMPLETION:  The sheath was removed and subclavian artery closure is performed by Dr Cyndia Bent. Protamine is administered once femoral arterial repair was complete.  The temporary pacemaker and pigtail catheters are removed. Mynx closure is used for femoral arterial hemostasis for the 6 Fr sheath.  The patient tolerated the procedure well and is transported to the surgical intensive care in stable condition. There were no immediate intraoperative complications. All sponge instrument and needle counts are verified correct at completion of the operation.   The patient received a total of 40 mL of intravenous contrast during the procedure.   Sherren Mocha, MD 06/07/2020 10:30 AM

## 2020-06-07 NOTE — Transfer of Care (Signed)
Immediate Anesthesia Transfer of Care Note  Patient: Brandon [name]  Procedure(s) Performed: TRANSCATHETER AORTIC VALVE REPLACEMENT, LEFT SUBCLAVIAN APPROACH (Left Chest) TRANSESOPHAGEAL ECHOCARDIOGRAM (TEE) (N/A Esophagus) ULTRASOUND GUIDANCE FOR VASCULAR ACCESS (Right Groin)  Patient Location: Cath Lab  Anesthesia Type:General  Level of Consciousness: awake, alert  and oriented  Airway & Oxygen Therapy: Patient Spontanous Breathing and Patient connected to nasal cannula oxygen  Post-op Assessment: Report given to RN and Post -op Vital signs reviewed and stable  Post vital signs: Reviewed and stable  Last Vitals:  Vitals Value Taken Time  BP 145/56 06/07/20 1046  Temp    Pulse 69 06/07/20 1047  Resp 23 06/07/20 1047  SpO2 95 % 06/07/20 1047  Vitals shown include unvalidated device data.  Last Pain:  Vitals:   06/07/20 0621  TempSrc: Oral  PainSc:       Patients Stated Pain Goal: 3 (88/32/54 [id number])  Complications: No complications documented.

## 2020-06-07 NOTE — Anesthesia Postprocedure Evaluation (Signed)
Anesthesia Post Note  Patient: Brandon [name]  Procedure(s) Performed: TRANSCATHETER AORTIC VALVE REPLACEMENT, LEFT SUBCLAVIAN APPROACH (Left Chest) TRANSESOPHAGEAL ECHOCARDIOGRAM (TEE) (N/A Esophagus) ULTRASOUND GUIDANCE FOR VASCULAR ACCESS (Right Groin)     Patient location during evaluation: Cath Lab Anesthesia Type: General Level of consciousness: awake and alert Pain management: pain level controlled Vital Signs Assessment: post-procedure vital signs reviewed and stable Respiratory status: spontaneous breathing, nonlabored ventilation, respiratory function stable and patient connected to nasal cannula oxygen Cardiovascular status: blood pressure returned to baseline and stable Postop Assessment: no apparent nausea or vomiting Anesthetic complications: no   No complications documented.  Last Vitals:  Vitals:   06/07/20 1500 06/07/20 1530  BP: 139/60 127/79  Pulse: 68 77  Resp: 17 16  Temp:    SpO2: 95% 96%    Last Pain:  Vitals:   06/07/20 1555  TempSrc:   PainSc: 6                  Reagen Haberman COKER

## 2020-06-07 NOTE — Discharge Instructions (Signed)
ACTIVITY AND EXERCISE °• Daily activity and exercise are an important part of your recovery. People recover at different rates depending on their general health and type of valve procedure. °• Most people recovering from TAVR feel better relatively quickly  °• No lifting, pushing, pulling more than 10 pounds (examples to avoid: groceries, vacuuming, gardening, golfing): °            - For one week with a procedure through the groin. °            - For six weeks for procedures through the chest wall or neck. °NOTE: You will typically see one of our providers 7-14 days after your procedure to discuss WHEN TO RESUME the above activities.  °  °  °DRIVING °• Do not drive until you are seen for follow up and cleared by a provider. Generally, we ask patient to not drive for 1 week after their procedure. °• If you have been told by your doctor in the past that you may not drive, you must talk with him/her before you begin driving again. °  °DRESSING °• Groin site: you may leave the clear dressing over the site for up to one week or until it falls off. °  °HYGIENE °• If you had a femoral (leg) procedure, you may take a shower when you return home. After the shower, pat the site dry. Do NOT use powder, oils or lotions in your groin area until the site has completely healed. °• If you had a chest procedure, you may shower when you return home unless specifically instructed not to by your discharging practitioner. °            - DO NOT scrub incision; pat dry with a towel. °            - DO NOT apply any lotions, oils, powders to the incision. °            - No tub baths / swimming for at least 2 weeks. °• If you notice any fevers, chills, increased pain, swelling, bleeding or pus, please contact your doctor. °  °ADDITIONAL INFORMATION °• If you are going to have an upcoming dental procedure, please contact our office as you will require antibiotics ahead of time to prevent infection on your heart valve.  ° ° °If you have any  questions or concerns you can call the structural heart phone during normal business hours 8am-4pm. If you have an urgent need after hours or weekends please call [phone] to talk to the on call provider for general cardiology. If you have an emergency that requires immediate attention, please call 911.  ° ° °After TAVR Checklist ° °Check  Test Description  ° Follow up appointment in 1-2 weeks  You will see our structural heart physician assistant, Brandon [name]. Your incision sites will be checked and you will be cleared to drive and resume all normal activities if you are doing well.    ° 1 month echo and follow up  You will have an echo to check on your new heart valve and be seen back in the office by Brandon [name]. Many times the echo is not read by your appointment time, but Brandon will call you later that day or the following day to report your results.  ° Follow up with your primary cardiologist You will need to be seen by your primary cardiologist in the following 3-6 months after your 1 month appointment in the valve   clinic. Often times your Plavix or Aspirin will be discontinued during this time, but this is decided on a case by case basis.   ° 1 year echo and follow up You will have another echo to check on your heart valve after 1 year and be seen back in the office by Brandon [name]. This your last structural heart visit.  ° Bacterial endocarditis prophylaxis  You will have to take antibiotics for the rest of your life before all dental procedures (even teeth cleanings) to protect your heart valve. Antibiotics are also required before some surgeries. Please check with your cardiologist before scheduling any surgeries. Also, please make sure to tell us if you have a penicillin allergy as you will require an alternative antibiotic.   ° ° °

## 2020-06-07 NOTE — Anesthesia Procedure Notes (Signed)
Procedure Name: Intubation Date/Time: 06/07/2020 8:06 AM Performed by: Griffin Dakin, CRNA Pre-anesthesia Checklist: Patient identified, Emergency Drugs available, Suction available and Patient being monitored Patient Re-evaluated:Patient Re-evaluated prior to induction Oxygen Delivery Method: Circle system utilized Preoxygenation: Pre-oxygenation with 100% oxygen Induction Type: IV induction Ventilation: Oral airway inserted - appropriate to patient size and Two handed mask ventilation required Laryngoscope Size: Glidescope and 4 Grade View: Grade I Tube type: Oral Tube size: 7.5 mm Number of attempts: 1 Airway Equipment and Method: Oral airway,  Video-laryngoscopy and Rigid stylet Placement Confirmation: ETT inserted through vocal cords under direct vision,  positive ETCO2 and breath sounds checked- equal and bilateral Secured at: 22 cm Tube secured with: Tape Dental Injury: Teeth and Oropharynx as per pre-operative assessment

## 2020-06-07 NOTE — Progress Notes (Signed)
cxr pending from yesterday, called radiology, spoke to [facility], informed her the test needed to be read this morning prior to pt's surgery at 0730.

## 2020-06-07 NOTE — Anesthesia Procedure Notes (Signed)
Arterial Line Insertion Start/End5/04/2020 7:00 AM, 06/07/2020 7:10 AM Performed by: Roberts Gaudy, MD, Griffin Dakin, CRNA, CRNA  Patient location: Pre-op. Preanesthetic checklist: patient identified, IV checked, site marked, risks and benefits discussed, surgical consent, monitors and equipment checked, pre-op evaluation, timeout performed and anesthesia consent Lidocaine 1% used for infiltration Right, radial was placed Catheter size: 20 G Hand hygiene performed  and maximum sterile barriers used   Attempts: 1 Procedure performed without using ultrasound guided technique. Following insertion, dressing applied and Biopatch. Post procedure assessment: normal and unchanged  Patient tolerated the procedure well with no immediate complications.

## 2020-06-07 NOTE — Interval H&P Note (Signed)
History and Physical Interval Note:  06/07/2020 6:43 AM  Brandon [name]  has presented today for surgery, with the diagnosis of Severe Aortic Stenosis.  The various methods of treatment have been discussed with the patient and family. After consideration of risks, benefits and other options for treatment, the patient has consented to  Procedure(s): TRANSCATHETER AORTIC VALVE REPLACEMENT, LEFT SUBCLAVIAN (Left) TRANSESOPHAGEAL ECHOCARDIOGRAM (TEE) (N/A) as a surgical intervention.  The patient's history has been reviewed, patient examined, no change in status, stable for surgery.  I have reviewed the patient's chart and labs.  Questions were answered to the patient's satisfaction.     Gaye Pollack

## 2020-06-07 NOTE — Op Note (Signed)
[facility]   MULTIDISCIPLINARY HEART VALVE TEAM   TAVR OPERATIVE NOTE   Date of Procedure:  06/07/2020  Preoperative Diagnosis: Severe Aortic Stenosis   Postoperative Diagnosis: Same   Procedure:    Transcatheter Aortic Valve Replacement - Left Subclavian Artery Approach  Edwards Sapien 3 THV (size 29 mm, model # 9600TFX, serial # [id number])   Co-Surgeons:  Gaye Pollack, MD and Sherren Mocha, MD   Anesthesiologist:  Roberts Gaudy, MD  Echocardiographer:  Jenkins Rouge, MD  Pre-operative Echo Findings:  Severe aortic stenosis, Mild aortic insufficiency  Normal left ventricular systolic function  Post-operative Echo Findings:  Trivial paravalvular leak  Normal left ventricular systolic function   BRIEF CLINICAL NOTE AND INDICATIONS FOR SURGERY  This 80 year old gentleman has stage D, severe, symptomatic aortic stenosis with New York Heart Association class II symptoms of exertional fatigue and shortness of breath consistent with chronic diastolic congestive heart failure. He is also having some exertional chest pressure. I have personally reviewed his 2D echocardiogram, cardiac catheterization, and CTA studies. His echocardiogram shows a trileaflet aortic valve with severe calcification and restricted leaflet mobility with a mean gradient of 50 mmHg and a valve area of 0.96 cm. His left ventricular ejection fraction is 60 to 65% with grade 2 diastolic dysfunction. Cardiac catheterization shows a known chronic occlusion of the RCA with brisk collaterals from the LAD. The LAD has a 95% calcified proximal stenosis that is compromising the large LAD system as well as the collaterals to the RCA. The left circumflex has a 60 to 70% long proximal stenosis.Right heart pressures were normal with a normal cardiac output. I agree that aortic valve replacement  is indicated in this patient to prevent progressive left ventricular deterioration and myocardial loss.   He underwent successful PCI and stenting of his LAD stenosis by Dr. Burt Knack. His gated cardiac CTA shows anatomy suitable for transcatheter aortic valve replacement using a 29 mm Sapien3 valve. 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 This would certainly increase the risk of transfemoral insertion. His left subclavian artery appears to be of adequate size for insertion of a 16 French sheath and this would probably be the best option.He already has an appointment set up to see Dr. Servando Snare for vascular surgery evaluation of his abdominal aortic aneurysm.  The patientand his wife werecounseled at length regarding treatment alternatives for management of severe symptomatic aortic stenosis. The risks and benefits of surgical intervention has been discussed in detail. Long-term prognosis with medical therapy was discussed. Alternative approaches such as conventional surgical aortic valve replacement, transcatheter aortic valve replacement, and palliative medical therapy were compared and contrasted at length. This discussion was placed in the context of the patient's own specific clinical presentation and past medical history. All of their questions have been addressed.   Following the decision to proceed with transcatheter aortic valve replacement, a discussion was held regarding what types of management strategies would be attempted intraoperatively in the event of life-threatening complications, including whether or not the patient would be considered a candidate for the use of cardiopulmonary bypass and/or conversion to open sternotomy for attempted surgical intervention.I think he would be a candidate for emergent sternotomy to manage any intraoperative complications. The patient is aware of the fact that transient use  of cardiopulmonary bypass may be necessary. The patient  has been advised of a variety of complications that might develop including but not limited to risks of death, stroke, paravalvular leak, aortic dissection or other major vascular complications, aortic annulus rupture, device embolization, cardiac rupture or perforation, mitral regurgitation, acute myocardial infarction, arrhythmia, heart block or bradycardia requiring permanent pacemaker placement, congestive heart failure, respiratory failure, renal failure, pneumonia, infection, other late complications related to structural valve deterioration or migration, or other complications that might ultimately cause a temporary or permanent loss of functional independence or other long term morbidity. The patient provides full informed consent for the procedure as described and all questions were answered.    DETAILS OF THE OPERATIVE PROCEDURE  PREPARATION:    The patient was brought to the operating room on the above mentioned date and appropriate monitoring was established by the anesthesia team. The patient was placed in the supine position on the operating table.  Intravenous antibiotics were administered.  General endotracheal anesthesia was induced uneventfully.  Baseline transesophageal echocardiogram was performed. The patient's abdomen and both groins were prepped and draped in a sterile manner. A time out procedure was performed.   PERIPHERAL ACCESS:    Using the modified Seldinger technique, femoral arterial and venous access was obtained with placement of 6 Fr sheaths on the right side.  A pigtail diagnostic catheter was passed through the right arterial sheath under fluoroscopic guidance into the aortic root.  A temporary transvenous pacemaker catheter was passed through the right femoral venous sheath under fluoroscopic guidance into the right ventricle.  The pacemaker was tested to ensure stable lead placement and pacemaker capture.  Aortic root angiography was performed in order to determine the optimal angiographic angle for valve deployment.   TRANS SUBCLAVIAN ACCESS:  A transverse incision was made below the left clavicle. The pectoralis major muscle was split along its fibers and the pectoralis minor muscle was retracted laterally. The brachial plexus was identified and gently retracted laterally to expose the axillary artery. The patient was heparinized systemically and ACT verified > 250 seconds.    A double concentric purse string suture of CV-4 Gortex suture with small Gortex pledgetts was placed in the left axillary artery. The artery was cannulated with a needle within the purse string sutures and a J-wire was passed into the left axillary artery and advanced into the ascending aorta. The needle was removed and an 8-F sheath inserted over the wire. A straight wire and AL-2 catheter were passed through the sheath and across the valve. The AL-2 was exchanged for a pigtail catheter.   The pigtail catheter was exchanged for an Amplatz Extra-stiff wire in the LV apex. Then the pigtail was removed and the [place] was inserted into the axillary/subclavian artery. The tip of the sheath was advanced into the aortic arch.    TRANSCATHETER HEART VALVE DEPLOYMENT:   An Edwards Sapien 3 transcatheter heart valve (size 29 mm) was prepared and crimped per manufacturer's guidelines, and the proper orientation of the valve is confirmed on the [employer] delivery system. The valve was advanced through the introducer sheath using normal technique until in an appropriate position in the ascending aorta beyond the sheath tip. The balloon was then retracted and using the fine-tuning wheel was centered on the valve. The valve was then advanced across the native valve using appropriate flexion of the catheter. The valve was carefully positioned across the aortic valve annulus. The Commander catheter was retracted using normal  technique. Once final position of the valve has  been confirmed by angiographic assessment, the valve is deployed while temporarily holding ventilation and during rapid ventricular pacing to maintain systolic blood pressure < 50 mmHg and pulse pressure < 10 mmHg. The balloon inflation is held for >3 seconds after reaching full deployment volume. Once the balloon has fully deflated the balloon is retracted into the ascending aorta and valve function is assessed using echocardiography. There is felt to be trivial paravalvular leak and no central aortic insufficiency. Post-procedural gradient was acceptable.  The patient's hemodynamic recovery following valve deployment is good.  The deployment balloon and guidewire are both removed.    BALLOON AORTIC VALVULOPLASTY:   Not performed.    PROCEDURE COMPLETION:   The sheath was removed and the left axillary artery closure performed using the previously placed pursestring sutures.  Protamine was administered. The temporary pacemaker, pigtail catheter and femoral sheath were removed with manual pressure used for hemostasis.  A Mynx femoral closure device was utilized following removal of the diagnostic sheath in the right femoral artery. The left chest incision was closed using continuous 2-0 Vicryl suture to approximate the pectoralis muscle, 3-0 Vicryl for the subcutaneous tissue, and 3-0 Vicryl subcuticular skin closure.   The patient tolerated the procedure well and is transported to the cath lab recovery area in stable condition. There were no immediate intraoperative complications. All sponge instrument and needle counts are verified correct at completion of the operation.   No blood products were administered during the operation.  The patient received a total of 40 mL of intravenous contrast during the procedure.   Gaye Pollack, MD 06/07/2020 2:13 PM

## 2020-06-08 ENCOUNTER — Encounter (HOSPITAL_COMMUNITY): Payer: Self-pay | Admitting: Cardiovascular Disease

## 2020-06-08 ENCOUNTER — Inpatient Hospital Stay (HOSPITAL_COMMUNITY): Payer: Medicare Other

## 2020-06-08 DIAGNOSIS — I350 Nonrheumatic aortic (valve) stenosis: Secondary | ICD-10-CM

## 2020-06-08 DIAGNOSIS — Z952 Presence of prosthetic heart valve: Secondary | ICD-10-CM

## 2020-06-08 DIAGNOSIS — Z954 Presence of other heart-valve replacement: Secondary | ICD-10-CM

## 2020-06-08 LAB — ECHOCARDIOGRAM LIMITED
AR max vel: 5.62 cm2
AV Area VTI: 6.02 cm2
AV Area mean vel: 6.81 cm2
AV Mean grad: 8 mmHg
AV Peak grad: 17.1 mmHg
Ao pk vel: 2.07 m/s
Area-P 1/2: 3.58 cm2
Calc EF: 69.5 %
Height: 67 in
S' Lateral: 2.3 cm
Single Plane A2C EF: 65.7 %
Single Plane A4C EF: 72.2 %
Weight: 2793.67 oz

## 2020-06-08 LAB — BASIC METABOLIC PANEL
Anion gap: 5 (ref 5–15)
BUN: 7 mg/dL — ABNORMAL LOW (ref 8–23)
CO2: 28 mmol/L (ref 22–32)
Calcium: 8.4 mg/dL — ABNORMAL LOW (ref 8.9–10.3)
Chloride: 105 mmol/L (ref 98–111)
Creatinine, Ser: 0.6 mg/dL — ABNORMAL LOW (ref 0.61–1.24)
GFR, Estimated: >60 mL/min (ref >60)
Glucose, Bld: 84 mg/dL (ref 70–99)
Potassium: 3.3 mmol/L — ABNORMAL LOW (ref 3.5–5.1)
Sodium: 138 mmol/L (ref 135–145)

## 2020-06-08 LAB — CBC
HCT: 32.2 % — ABNORMAL LOW (ref 39.0–52.0)
Hemoglobin: 9.7 g/dL — ABNORMAL LOW (ref 13.0–17.0)
MCH: 25.3 pg — ABNORMAL LOW (ref 26.0–34.0)
MCHC: 30.1 g/dL (ref 30.0–36.0)
MCV: 83.9 fL (ref 80.0–100.0)
Platelets: UNDETERMINED 10*3/uL (ref 150–400)
RBC: 3.84 MIL/uL — ABNORMAL LOW (ref 4.22–5.81)
RDW: 14.7 % (ref 11.5–15.5)
WBC: 7.1 10*3/uL (ref 4.0–10.5)
nRBC: 0 % (ref 0.0–0.2)

## 2020-06-08 LAB — MAGNESIUM: Magnesium: 1.8 mg/dL (ref 1.7–2.4)

## 2020-06-08 MED ORDER — LISINOPRIL 10 MG PO TABS
10.0000 mg | ORAL_TABLET | Freq: Every day | ORAL | Status: DC
  Administered 2020-06-08: 10 mg via ORAL
  Filled 2020-06-08: qty 1

## 2020-06-08 MED ORDER — ISOSORBIDE MONONITRATE ER 30 MG PO TB24: 30.0000 mg | ORAL_TABLET | Freq: Every day | ORAL | Status: DC

## 2020-06-08 MED ORDER — CARVEDILOL 6.25 MG PO TABS
6.2500 mg | ORAL_TABLET | Freq: Two times a day (BID) | ORAL | Status: DC
  Administered 2020-06-08: 6.25 mg via ORAL
  Filled 2020-06-08: qty 1

## 2020-06-08 MED ORDER — AMLODIPINE BESYLATE 10 MG PO TABS
10.0000 mg | ORAL_TABLET | Freq: Every day | ORAL | Status: DC
  Administered 2020-06-08: 10 mg via ORAL
  Filled 2020-06-08: qty 1

## 2020-06-08 MED ORDER — POTASSIUM CHLORIDE CRYS ER 20 MEQ PO TBCR
40.0000 meq | EXTENDED_RELEASE_TABLET | Freq: Once | ORAL | Status: AC
  Administered 2020-06-08: 40 meq via ORAL
  Filled 2020-06-08: qty 2

## 2020-06-08 MED FILL — Magnesium Sulfate Inj 50%: INTRAMUSCULAR | Qty: 10 | Status: AC

## 2020-06-08 MED FILL — Heparin Sodium (Porcine) Inj 1000 Unit/ML: INTRAMUSCULAR | Qty: 30 | Status: AC

## 2020-06-08 MED FILL — Potassium Chloride Inj 2 mEq/ML: INTRAVENOUS | Qty: 40 | Status: AC

## 2020-06-08 NOTE — Progress Notes (Signed)
Pt discharged home with wife. AVS reviewed and all questions answered. IVs and telemetry box removed. Pt left with all his belongings. Pt discharged via wheelchair and was accompanied by a Wilfrid Lund.

## 2020-06-08 NOTE — Progress Notes (Signed)
1 Day Post-Op Procedure(s) (LRB): TRANSCATHETER AORTIC VALVE REPLACEMENT, LEFT SUBCLAVIAN APPROACH (Left) TRANSESOPHAGEAL ECHOCARDIOGRAM (TEE) (N/A) ULTRASOUND GUIDANCE FOR VASCULAR ACCESS (Right) Subjective: Some left shoulder pain but much better than yesterday.  Ambulated in halls this am without SOB.  Objective: Vital signs in last 24 hours: Temp:  [97.5 F (36.4 C)-98.8 F (37.1 C)] 98 F (36.7 C) (05/04 0800) Pulse Rate:  [0-295] 62 (05/04 0800) Cardiac Rhythm: Normal sinus rhythm (05/04 0455) Resp:  [13-25] 16 (05/04 0800) BP: (107-153)/(41-88) 144/52 (05/04 0800) SpO2:  [92 %-98 %] 98 % (05/04 0800) Weight:  [79.2 kg] 79.2 kg (05/04 0625)  Hemodynamic parameters for last 24 hours:    Intake/Output from previous day: 05/03 0701 - 05/04 0700 In: 3955 [P.O.:1080; I.V.:1675; IV Piggyback:1200] Out: 1575 [Urine:1575] Intake/Output this shift: No intake/output data recorded.  General appearance: alert and cooperative Neurologic: intact Heart: regular rate and rhythm, S1, S2 normal, no murmur Lungs: clear to auscultation bilaterally Extremities: no edema Wound: incision dressing dry. Right groin site ok.  Lab Results: Recent Labs    06/06/20 0945 06/07/20 0829 06/07/20 1150 06/08/20 0145  WBC 10.0  --   --  7.1  HGB 11.2*   < > 10.2* 9.7*  HCT 37.2*   < > 30.0* 32.2*  PLT 160  --   --  PLATELET CLUMPS NOTED ON SMEAR, UNABLE TO ESTIMATE   < > = values in this interval not displayed.   BMET:  Recent Labs    06/06/20 0945 06/07/20 0829 06/07/20 1150 06/08/20 0145  NA 137   < > 141 138  K 3.7   < > 3.2* 3.3*  CL 100   < > 101 105  CO2 26  --   --  28  GLUCOSE 120*   < > 150* 84  BUN 13   < > 11 7*  CREATININE 0.56*   < > 0.60* 0.60*  CALCIUM 8.8*  --   --  8.4*   < > = values in this interval not displayed.    PT/INR:  Recent Labs    06/06/20 0945  LABPROT 13.3  INR 1.0   ABG    Component Value Date/Time   PHART 7.302 (L) 06/07/2020 1005    HCO3 28.7 (H) 06/07/2020 1005   TCO2 27 06/07/2020 1150   O2SAT 99.0 06/07/2020 1005   CBG (last 3)  No results for input(s): GLUCAP in the last 72 hours.  ECG: sinus rhythm  Assessment/Plan: S/P Procedure(s) (LRB): TRANSCATHETER AORTIC VALVE REPLACEMENT, LEFT SUBCLAVIAN APPROACH (Left) TRANSESOPHAGEAL ECHOCARDIOGRAM (TEE) (N/A) ULTRASOUND GUIDANCE FOR VASCULAR ACCESS (Right)  He is hemodynamically stable in sinus rhythm with no heart block on monitor.  2D echo today shows low mean gradient and trivial paravalvular leak seen in OR.   He is comfortable and ambulating well.  He can go home with wife today.  Continue ASA and Plavix.   LOS: 1 day    Gaye Pollack 06/08/2020

## 2020-06-08 NOTE — Progress Notes (Signed)
CARDIAC REHAB PHASE I   PRE:  Rate/Rhythm: 67 SR    BP: sitting 138/59    SaO2: 92 RA  MODE:  Ambulation: 430 ft   POST:  Rate/Rhythm: 90 SR    BP: sitting 161/62     SaO2: 95 RA  Pt ambulated independently without c/o. Feels well, VSS. Reviewed walking at home, restrictions, and CRPII. Will refer to [place] again. He quit smoking, congratulated and encouraged him. [place], ACSM 06/08/2020 9:33 AM

## 2020-06-08 NOTE — Progress Notes (Signed)
  Echocardiogram 2D Echocardiogram has been performed.  [name] 06/08/2020, 8:38 AM

## 2020-06-08 NOTE — Discharge Summary (Addendum)
[facility] VALVE TEAM  Discharge Summary    Patient ID: Brandon [name] MRN: [id number]; DOB: April 08, 1940  Admit date: 06/07/2020 Discharge date: 06/08/2020  Primary Care Provider: Haydee Salter, MD  Primary Cardiologist: Loralie Champagne, MD / Dr. Burt Knack & Dr. Cyndia Bent (TAVR)  Discharge Diagnoses    Principal Problem:   S/P TAVR (transcatheter aortic valve replacement) Active Problems:   Hyperlipidemia   Coronary artery disease   Severe aortic stenosis   Gastroesophageal reflux disease   Psoriasis   Essential hypertension   Aneurysm of infrarenal abdominal aorta (HCC)   Cancer of tonsillar fossa (HCC)   Anxiety and depression   Chronic pain syndrome   Polymyalgia rheumatica (HCC)   TIA (transient ischemic attack)   Esophageal stricture   Allergies Allergies  Allergen Reactions  . Celebrex [Celecoxib] Hives and Itching    Diagnostic Studies/Procedures     TAVR OPERATIVE NOTE   Date of Procedure:                06/07/2020  Preoperative Diagnosis:      Severe Aortic Stenosis   Postoperative Diagnosis:    Same   Procedure:        Transcatheter Aortic Valve Replacement - Left Subclavian Artery Approach             Edwards Sapien 3 THV (size 29 mm, model # 9600TFX, serial # [id number])              Co-Surgeons:                        Gaye Pollack, MD and Sherren Mocha, MD   Anesthesiologist:                  Roberts Gaudy, MD  Echocardiographer:              Jenkins Rouge, MD  Pre-operative Echo Findings: ? Severe aortic stenosis, Mild aortic insufficiency ? Normal left ventricular systolic function  Post-operative Echo Findings: ? Trivial paravalvular leak ? Normal left ventricular systolic function  _____________   Echo 06/08/20: IMPRESSIONS  1. Left ventricular ejection fraction, by estimation, is 60 to 65%. The  left ventricle has normal function. The left ventricle has no regional  wall motion  abnormalities. There is severe asymmetric left ventricular  hypertrophy of the basal and septal segments. Left ventricular diastolic parameters were normal.  2. Right ventricular systolic function is normal. The right ventricular  size is normal.  3. Left atrial size was moderately dilated.  4. The mitral valve is abnormal. Mild mitral valve regurgitation. No  evidence of mitral stenosis.  5. Post TAVR with 29 mm Sapien 3 valve no significant PVL mean gradient 8 peak 17 mmHg AVA 5.6 cm2 DVI 0.91 . The aortic valve is normal in  structure. Aortic valve regurgitation is not visualized. No aortic  stenosis is present.  6. Aortic dilatation noted. There is mild dilatation of the ascending  aorta, measuring 41 mm.  7. The inferior vena cava is normal in size with greater than 50%  respiratory variability, suggesting right atrial pressure of 3 mmHg.    History of Present Illness     Brandon [name] is a 80 y.o. 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  The patient has been followed for aortic stenosis with surveillance echo studies and close clinical care by Dr. Shirlee Latch. He has developed progressive and now severe aortic stenosis with mean gradient of 50 mmHg and calculated valve area of 0.96 cm. LVEF remains preserved at 60 to 65%. He underwent cardiac catheterization for preoperative assessment and was demonstrated to have chronic occlusion of the right coronary artery with left-to-right collaterals, severe calcific proximal LAD stenosis, and moderately severe long segment mid circumflex  stenosis.The patient reported exertional chest pain and dyspnea. He was referred to the multidisciplinary valve team for consideration of SAVR+CABG or TAVR+PCI.Cardiac gated CTA of the heart revealedanatomical characteristics consistent with aortic stenosis suitable for treatment by transcatheter aortic valve replacement without any significant complicating features.CTA of the aorta and iliac vessels demonstrateda large infrarenal abdominal aortic aneurysm which is increasing in size, currently measuring 5.0x 4.2 cm in diameter (previously 4.3 cm on 02/26/2017).Seen by Dr. Randie Heinz with vascular who recommended repeat CT scan in 5-6 months and proceed with TAVR. HE felt he may be a candidate for endovascular repair in the future.     He was evaluated by the multidisciplinary valve team inlcuding Dr. Excell Seltzer and Dr Laneta Simmers who felt PCI+TAVR via the subclavian approach was the most appropriate course of action for the patient. He underwent successful orbital atherectomy and stenting of severe calcific stenosis in the proximal LAD, procedure guided by OCT imaging, treated with a 4.0 x 12 mm resolute Onyx DES. There was moderate left circumflex stenosis with negative pressure wire analysis.He was continued on uninterrupted DAPT with aspirin and Plavix x 6 months.  He was brought back for stage TAVR which was scheduled for 06/07/20.  Hospital Course     Consultants: nonr  Severe AS: s/p successful TAVR with a 29 mm Edwards Sapien 3 THV via the subclavian approach on 06/07/20. Post operative echo showed EF 60%, normally functioning TAVR with a mean gradient of 8 mmHg and no PVL. Groin/subclavian sites are stable. Had some left shoulder pain yesterday that is much improved today. ECG with sinus and no high grade heart block. Initial post op ECG showed a new LBBB but this has now resolved. Continue DAPT with Asprin and Plavix. Plan for discharge home today with close follow up in the office next week.   CAD with  angina: he underwent successful orbital atherectomy and stenting of severe calcific stenosis in the proximal LAD, procedure guided by OCT imaging, treated with a 4.0 x 12 mm resolute Onyx DES. There was moderate left circumflex stenosis with negative pressure wire analysis.Radial site healing well. He will be continued on uninterrupted DAPT with aspirin and Plavix x 6 months. Continue statin, BB and nitrates.  AAA: pre TAVR CT showeda large infrarenal abdominal aortic aneurysm which is increasing in size, currently measuring 5.0x 4.2 cm in diameter (previously 4.3 cm on 02/26/2017). Followed by Dr. Randie Heinz.   HTN: BP elevated due to holding home meds. All home medications have been resumed.   Hypokalemia: supplemented prior to discharge. Check BMET next week, may need to add a low dose potassium supplement _____________  Discharge Vitals Blood pressure (!) 161/62, pulse 62, temperature 98 F (36.7 C), temperature source Oral, resp. rate 16, height 5\' 7"  (1.702 m), weight 79.2 kg, SpO2 98 %.  Filed Weights  06/07/20 0621 06/08/20 0625  Weight: 79.4 kg 79.2 kg    GEN: Well nourished, well developed, in no acute distress HEENT: normal Neck: no JVD or masses Cardiac:  RRR; no murmurs, rubs, or gallops,no edema  Respiratory:  clear to auscultation bilaterally, normal work of breathing GI: soft, nontender, nondistended, + BS MS: no deformity or atrophy Skin: warm and dry, no rash. Groin and subclavian site without hematoma or ecchymosis Neuro:  Alert and Oriented x 3, Strength and sensation are intact Psych: euthymic mood, full affect    Labs & Radiologic Studies    CBC Recent Labs    06/06/20 0945 06/07/20 0829 06/07/20 1150 06/08/20 0145  WBC 10.0  --   --  7.1  HGB 11.2*   < > 10.2* 9.7*  HCT 37.2*   < > 30.0* 32.2*  MCV 84.0  --   --  83.9  PLT 160  --   --  PLATELET CLUMPS NOTED ON SMEAR, UNABLE TO ESTIMATE   < > = values in this interval not displayed.   Basic  Metabolic Panel Recent Labs    06/06/20 0945 06/07/20 0829 06/07/20 1150 06/08/20 0145  NA 137   < > 141 138  K 3.7   < > 3.2* 3.3*  CL 100   < > 101 105  CO2 26  --   --  28  GLUCOSE 120*   < > 150* 84  BUN 13   < > 11 7*  CREATININE 0.56*   < > 0.60* 0.60*  CALCIUM 8.8*  --   --  8.4*  MG  --   --   --  1.8   < > = values in this interval not displayed.   Liver Function Tests Recent Labs    06/06/20 0945  AST 14*  ALT 11  ALKPHOS 84  BILITOT 0.9  PROT 6.3*  ALBUMIN 3.4*   No results for input(s): LIPASE, AMYLASE in the last 72 hours. Cardiac Enzymes No results for input(s): CKTOTAL, CKMB, CKMBINDEX, TROPONINI in the last 72 hours. BNP Invalid input(s): POCBNP D-Dimer No results for input(s): DDIMER in the last 72 hours. Hemoglobin A1C No results for input(s): HGBA1C in the last 72 hours. Fasting Lipid Panel No results for input(s): CHOL, HDL, LDLCALC, TRIG, CHOLHDL, LDLDIRECT in the last 72 hours. Thyroid Function Tests No results for input(s): TSH, T4TOTAL, T3FREE, THYROIDAB in the last 72 hours.  Invalid input(s): FREET3 _____________  DG Chest 2 View  Result Date: 06/07/2020 CLINICAL DATA:  80 year old male under preoperative evaluation prior to aortic valve replacement. EXAM: CHEST - 2 VIEW COMPARISON:  Chest x-ray 07/19/2016. FINDINGS: Lung volumes are normal. No consolidative airspace disease. No pleural effusions. No pneumothorax. No pulmonary nodule or mass noted. Pulmonary vasculature and the cardiomediastinal silhouette are within normal limits. Atherosclerosis in the thoracic aorta. IMPRESSION: 1.  No radiographic evidence of acute cardiopulmonary disease. 2. Aortic atherosclerosis. Electronically Signed   By: Vinnie Langton M.D.   On: 06/07/2020 09:23   ECHO TEE  Result Date: 06/07/2020    TRANSESOPHOGEAL ECHO REPORT   Patient Name:   Brandon [name] Date of Exam: 06/07/2020 Medical Rec #:  [id number]    Height:       67.0 in Accession #:    [id number]    Weight:       175.1 lb Date of Birth:  [birth year]    BSA:          1.911 m Patient Age:  79 years     BP:           134/72 mmHg Patient Gender: M            HR:           62 bpm. Exam Location:  Inpatient Procedure: 3D Echo, TEE-Intraopertive, Cardiac Doppler and Color Doppler Indications:     I35.2 Nonrheumatic aortic (valve) stenosis with insufficiency  History:         Patient has prior history of Echocardiogram examinations, most                  recent 08/21/2020. CAD, TIA, Aortic Valve Disease,                  Signs/Symptoms:Chest Pain; Risk Factors:Current Smoker and                  Hypertension. Severe aortic stenosis. Cancer.  Sonographer:     Roseanna Rainbow RDCS Referring Phys:  Ryan Diagnosing Phys: Jenkins Rouge MD  Sonographer Comments: TAVR procedure. PROCEDURE: After discussion of the risks and benefits of a TEE, an informed consent was obtained from the patient. The patient was intubated. The transesophogeal probe was passed without difficulty through the esophogus of the patient. Imaged were obtained with the patient in a supine position. Sedation performed by different physician. The patient was monitored while under deep sedation. Image quality was excellent. The patient developed Respiratory depression during the procedure. Continuous sedation. IMPRESSIONS  1. Left ventricular ejection fraction, by estimation, is 60 to 65%. The left ventricle has normal function. There is mild left ventricular hypertrophy.  2. Right ventricular systolic function is normal. The right ventricular size is normal.  3. Left atrial size was mildly dilated. No left atrial/left atrial appendage thrombus was detected.  4. The mitral valve is degenerative. Mild mitral valve regurgitation.  5. Tricuspid valve regurgitation is mild to moderate.  6. Pre TAVR: severe calcific AS with tri leaflet valve. mean gradient 41 mmHg peak 76 mmHg with mild tomoderate AR. AVA 1.2 cm2 o.71 /BSA         Post TAVR: well  positioned 29 mm Sapien 2 valve Trivial PVL seen at base of anterior mitral leaflet mean gradient 6 peak 12 mmHg AVA 3.1 cm2 1.7 cm2/m2. The aortic valve has been repaired/replaced. Aortic valve regurgitation is mild to moderate. Severe aortic valve stenosis. FINDINGS  Left Ventricle: Left ventricular ejection fraction, by estimation, is 60 to 65%. The left ventricle has normal function. The left ventricular internal cavity size was small. There is mild left ventricular hypertrophy. Right Ventricle: The right ventricular size is normal. No increase in right ventricular wall thickness. Right ventricular systolic function is normal. Left Atrium: Left atrial size was mildly dilated. No left atrial/left atrial appendage thrombus was detected. Right Atrium: Right atrial size was normal in size. Pericardium: There is no evidence of pericardial effusion. Mitral Valve: The mitral valve is degenerative in appearance. There is mild thickening of the mitral valve leaflet(s). There is mild calcification of the mitral valve leaflet(s). Mild mitral annular calcification. Mild mitral valve regurgitation. Tricuspid Valve: The tricuspid valve is normal in structure. Tricuspid valve regurgitation is mild to moderate. Aortic Valve: Pre TAVR: severe calcific AS with tri leaflet valve. mean gradient 41 mmHg peak 76 mmHg with mild tomoderate AR. AVA 1.2 cm2 o.71 /BSA Post TAVR: well positioned 29 mm Sapien 2 valve Trivial PVL seen at base of anterior mitral leaflet mean gradient 6  peak 12 mmHg AVA 3.1 cm2 1.7 cm2/m2. The aortic valve has been repaired/replaced. Aortic valve regurgitation is mild to moderate. Severe aortic stenosis is present. Aortic valve mean gradient measures 27.0 mmHg. Aortic valve peak gradient measures 43.4 mmHg. Aortic valve area, by VTI measures 1.69 cm. Pulmonic Valve: The pulmonic valve was normal in structure. Pulmonic valve regurgitation is mild. Aorta: The aortic root is normal in size and structure.  IAS/Shunts: No atrial level shunt detected by color flow Doppler.  LEFT VENTRICLE PLAX 2D LVOT diam:     2.40 cm LV SV:         131 LV SV Index:   68 LVOT Area:     4.52 cm  AORTIC VALVE AV Area (Vmax):    1.88 cm AV Area (Vmean):   1.76 cm AV Area (VTI):     1.69 cm AV Vmax:           329.33 cm/s AV Vmean:          233.667 cm/s AV VTI:            0.775 m AV Peak Grad:      43.4 mmHg AV Mean Grad:      27.0 mmHg LVOT Vmax:         136.67 cm/s LVOT Vmean:        90.733 cm/s LVOT VTI:          0.289 m LVOT/AV VTI ratio: 0.37  SHUNTS Systemic VTI:  0.29 m Systemic Diam: 2.40 cm Jenkins Rouge MD Electronically signed by Jenkins Rouge MD Signature Date/Time: 06/07/2020/10:27:40 AM    Final    ECHOCARDIOGRAM LIMITED  Result Date: 06/08/2020    ECHOCARDIOGRAM REPORT   Patient Name:   Brandon [name] Date of Exam: 06/08/2020 Medical Rec #:  [id number]    Height:       67.0 in Accession #:    [id number]   Weight:       174.6 lb Date of Birth:  1940-12-01    BSA:          1.909 m Patient Age:    80 years     BP:           148/61 mmHg Patient Gender: M            HR:           60 bpm. Exam Location:  Inpatient Procedure: 3D Echo, Limited Echo, Cardiac Doppler and Color Doppler Indications:    I35.0 Nonrheumatic aortic (valve) stenosis. Post TAVR.  History:        Patient has prior history of Echocardiogram examinations, most                 recent 06/07/2020. CAD, TIA, Aortic Valve Disease; Risk                 Factors:Hypertension and Dyslipidemia. Severe aortic stenosis.                 S/P TAVR using 30mm Edwards Sapien 2 valve.  Sonographer:    Roseanna Rainbow RDCS Referring Phys: [id number] [place]  1. Left ventricular ejection fraction, by estimation, is 60 to 65%. The left ventricle has normal function. The left ventricle has no regional wall motion abnormalities. There is severe asymmetric left ventricular hypertrophy of the basal and septal segments. Left ventricular diastolic parameters were normal.  2.  Right ventricular systolic function is normal. The right ventricular size is normal.  3.  Left atrial size was moderately dilated.  4. The mitral valve is abnormal. Mild mitral valve regurgitation. No evidence of mitral stenosis.  5. Post TAVR with 29 mm Sapien 3 valve no significant PVL mean gradient 8 peak 17 mmHg AVA 5.6 cm2 DVI 0.91 . The aortic valve is normal in structure. Aortic valve regurgitation is not visualized. No aortic stenosis is present.  6. Aortic dilatation noted. There is mild dilatation of the ascending aorta, measuring 41 mm.  7. The inferior vena cava is normal in size with greater than 50% respiratory variability, suggesting right atrial pressure of 3 mmHg. FINDINGS  Left Ventricle: Left ventricular ejection fraction, by estimation, is 60 to 65%. The left ventricle has normal function. The left ventricle has no regional wall motion abnormalities. The left ventricular internal cavity size was normal in size. There is  severe asymmetric left ventricular hypertrophy of the basal and septal segments. Left ventricular diastolic parameters were normal. Right Ventricle: The right ventricular size is normal. No increase in right ventricular wall thickness. Right ventricular systolic function is normal. Left Atrium: Left atrial size was moderately dilated. Right Atrium: Right atrial size was normal in size. Pericardium: There is no evidence of pericardial effusion. Mitral Valve: The mitral valve is abnormal. There is mild thickening of the mitral valve leaflet(s). There is mild calcification of the mitral valve leaflet(s). Mild mitral annular calcification. Mild mitral valve regurgitation. No evidence of mitral valve stenosis. Tricuspid Valve: The tricuspid valve is normal in structure. Tricuspid valve regurgitation is not demonstrated. No evidence of tricuspid stenosis. Aortic Valve: Post TAVR with 29 mm Sapien 3 valve no significant PVL mean gradient 8 peak 17 mmHg AVA 5.6 cm2 DVI 0.91. The aortic  valve is normal in structure. Aortic valve regurgitation is not visualized. No aortic stenosis is present. Aortic valve mean gradient measures 8.0 mmHg. Aortic valve peak gradient measures 17.1 mmHg. Aortic valve area, by VTI measures 6.02 cm. There is a 29 mm Sapien prosthetic, stented (TAVR) valve present in the aortic position. Pulmonic Valve: The pulmonic valve was normal in structure. Pulmonic valve regurgitation is not visualized. No evidence of pulmonic stenosis. Aorta: The aortic root is normal in size and structure and aortic dilatation noted. There is mild dilatation of the ascending aorta, measuring 41 mm. Venous: The inferior vena cava is normal in size with greater than 50% respiratory variability, suggesting right atrial pressure of 3 mmHg. IAS/Shunts: No atrial level shunt detected by color flow Doppler.  LEFT VENTRICLE PLAX 2D LVIDd:         3.70 cm LVIDs:         2.30 cm LV PW:         2.10 cm LV IVS:        1.80 cm LVOT diam:     2.90 cm LV SV:         278 LV SV Index:   146 LVOT Area:     6.61 cm  LV Volumes (MOD) LV vol d, MOD A2C: 91.6 ml LV vol d, MOD A4C: 105.0 ml LV vol s, MOD A2C: 31.4 ml LV vol s, MOD A4C: 29.2 ml LV SV MOD A2C:     60.2 ml LV SV MOD A4C:     105.0 ml LV SV MOD BP:      70.5 ml IVC IVC diam: 2.00 cm LEFT ATRIUM            Index LA diam:      5.10 cm  2.67 cm/m LA Vol (A4C): 104.0 ml 54.48 ml/m  AORTIC VALVE AV Area (Vmax):    5.62 cm AV Area (Vmean):   6.81 cm AV Area (VTI):     6.02 cm AV Vmax:           207.00 cm/s AV Vmean:          132.000 cm/s AV VTI:            0.462 m AV Peak Grad:      17.1 mmHg AV Mean Grad:      8.0 mmHg LVOT Vmax:         176.00 cm/s LVOT Vmean:        136.000 cm/s LVOT VTI:          0.421 m LVOT/AV VTI ratio: 0.91  AORTA Ao Root diam: 4.10 cm Ao Asc diam:  4.10 cm MITRAL VALVE MV Area (PHT): 3.58 cm     SHUNTS MV Decel Time: 212 msec     Systemic VTI:  0.42 m MV E velocity: 131.00 cm/s  Systemic Diam: 2.90 cm MV A velocity: 87.30 cm/s MV  E/A ratio:  1.50 Jenkins Rouge MD Electronically signed by Jenkins Rouge MD Signature Date/Time: 06/08/2020/8:44:56 AM    Final    Structural Heart Procedure  Result Date: 06/07/2020 See surgical note for result.  Disposition   Pt is being discharged home today in good condition.  Follow-up Plans & Appointments     Follow-up Information    Eileen Stanford, PA-C. Go on 06/15/2020.   Specialties: Cardiology, Radiology Why: @ 1 :30pm, please arrive at least 10 minutes early.  Contact information: [place] [address] [phone]              Discharge Instructions    Amb Referral to Cardiac Rehabilitation   Complete by: As directed    Diagnosis:  Coronary Stents Valve Replacement PTCA     Valve: Aortic Comment - TAVR   After initial evaluation and assessments completed: Virtual Based Care may be provided alone or in conjunction with Phase 2 Cardiac Rehab based on patient barriers.: Yes      Discharge Medications   Allergies as of 06/08/2020      Reactions   Celebrex [celecoxib] Hives, Itching      Medication List    TAKE these medications   amLODipine 10 MG tablet Commonly known as: NORVASC Take 1 tablet (10 mg total) by mouth daily.   aspirin 81 MG EC tablet Take 81 mg by mouth at bedtime.   atorvastatin 80 MG tablet Commonly known as: LIPITOR Take 1 tablet (80 mg total) by mouth daily.   carvedilol 6.25 MG tablet Commonly known as: Coreg Take 1 tablet (6.25 mg total) by mouth 2 (two) times daily.   citalopram 20 MG tablet Commonly known as: CeleXA Take 1 tablet (20 mg total) by mouth daily.   clopidogrel 75 MG tablet Commonly known as: PLAVIX Take 1 tablet (75 mg total) by mouth daily.   ezetimibe 10 MG tablet Commonly known as: Zetia Take 1 tablet (10 mg total) by mouth daily.   fluticasone 50 MCG/ACT nasal spray Commonly known as: FLONASE Place 2 sprays into both nostrils daily as needed for rhinitis or  allergies.   gabapentin 300 MG capsule Commonly known as: NEURONTIN TAKE 1 CAPSULE(300 MG) BY MOUTH THREE TIMES DAILY What changed:   how much to take  how to take this  when to take this  additional instructions   isosorbide mononitrate 30 MG 24 hr tablet Commonly known as: IMDUR TAKE 1 TABLET(30 MG) BY MOUTH AT BEDTIME What changed: See the new instructions.   lisinopril 10 MG tablet Commonly known as: ZESTRIL TAKE 1 TABLET(10 MG) BY MOUTH DAILY What changed: See the new instructions.   nicotine 21 mg/24hr patch Commonly known as: NICODERM CQ - dosed in mg/24 hours Place 21 mg onto the skin daily.   oxyCODONE 5 MG immediate release tablet Commonly known as: Oxy IR/ROXICODONE Take 1-2 tablets (5-10 mg total) by mouth every 6 (six) hours as needed for severe pain (to take 2 tabs ONLY when severe pain- which is not usual for pt).   pantoprazole 40 MG tablet Commonly known as: PROTONIX Take 1 tablet (40 mg total) by mouth daily.   predniSONE 5 MG tablet Commonly known as: DELTASONE Take 1 tablet (5 mg total) by mouth daily with breakfast. What changed: Another medication with the same name was changed. Make sure you understand how and when to take each.   predniSONE 1 MG tablet Commonly known as: DELTASONE Take 2 tablets p.o. every morning What changed:   how much to take  how to take this  when to take this  additional instructions   protein supplement shake Liqd Commonly known as: PREMIER PROTEIN Take 11 oz by mouth in the morning.   tamsulosin 0.4 MG Caps capsule Commonly known as: FLOMAX Take 0.4 mg by mouth at bedtime.   traMADol 50 MG tablet Commonly known as: Ultram Take 2 tablets (100 mg total) by mouth 2 (two) times daily.   trolamine salicylate 10 % cream Commonly known as: ASPERCREME Apply 1 application topically as needed for muscle pain.        Outstanding Labs/Studies   BMET  Duration of Discharge Encounter   Greater than 30  minutes including physician time.  Mable Fill, PA-C 06/08/2020, 10:15 AM [phone]

## 2020-06-09 ENCOUNTER — Telehealth: Payer: Self-pay | Admitting: Physician Assistant

## 2020-06-09 NOTE — Telephone Encounter (Signed)
  [place] VALVE TEAM   Patient contacted regarding discharge from [facility] on 5/4  Patient understands to follow up with provider Nell Range on 5/11 at [facility]  Patient understands discharge instructions? yes Patient understands medications and regimen? Yes Patient understands to bring all medications to this visit? Yes  Having some soreness in shoulder but otherwise doing okay.   Angelena Form PA-C  [facility]

## 2020-06-13 ENCOUNTER — Telehealth: Payer: Self-pay | Admitting: Cardiovascular Disease

## 2020-06-13 NOTE — Telephone Encounter (Signed)
Called pt to inform him that Dalia Heading, did not prescribe this medication and that he needed to contact the prescribing doctor to request a refill. I advised the pt that if he has any cardiac problems, questions or concerns, to please give our office a call. Pt verbalized understanding.

## 2020-06-13 NOTE — Telephone Encounter (Signed)
*  STAT* If patient is at the pharmacy, call can be transferred to refill team.   1. Which medications need to be refilled? (please list name of each medication and dose if known) oxyCODONE (OXY IR/ROXICODONE) 5 MG immediate release tablet  2. Which pharmacy/location (including street and city if local pharmacy) is medication to be sent to? [facility] [place], [place]  3. Do they need a 30 day or 90 day supply? 90 day supply  Pt is still having joint pain since his procedure on 06/07/20, Pt states he can better deal with his pain when he takes 2 pills every 3 hours and he only have 1/3 amount of this medicine left and he doesn't want to run out. Pt is due to see Angelena Form on 06/15/20 Pt said Dr.Megan Lovorn MD is who prescribed this medicine and he have not called to let her know that he still continuing to have joint pain.

## 2020-06-13 NOTE — Progress Notes (Deleted)
Office Visit Note  Patient: Brandon [name]             Date of Birth: 06-20-1940           MRN: [id number]             PCP: Haydee Salter, MD Referring: Brunetta Jeans, PA-C Visit Date: 06/27/2020 Occupation: @GUAROCC @  Subjective:  No chief complaint on file.   History of Present Illness: Brandon [name] is a 80 y.o. male ***   Activities of Daily Living:  Patient reports morning stiffness for *** {minute/hour:19697}.   Patient {ACTIONS;DENIES/REPORTS:21021675::"Denies"} nocturnal pain.  Difficulty dressing/grooming: {ACTIONS;DENIES/REPORTS:21021675::"Denies"} Difficulty climbing stairs: {ACTIONS;DENIES/REPORTS:21021675::"Denies"} Difficulty getting out of chair: {ACTIONS;DENIES/REPORTS:21021675::"Denies"} Difficulty using hands for taps, buttons, cutlery, and/or writing: {ACTIONS;DENIES/REPORTS:21021675::"Denies"}  No Rheumatology ROS completed.   PMFS History:  Patient Active Problem List   Diagnosis Date Noted  . 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 TIA (transient ischemic attack)   . Esophageal stricture   . 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  . 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 Primary osteoarthritis of both hands 01/11/2020  . Piriformis syndrome of both sides 09/23/2019  . Pain of both shoulder joints 07/29/2019  . Sciatica associated with disorder of lumbar spine 04/24/2019  . Primary osteoarthritis involving multiple joints 04/24/2019  . Chronic pain syndrome 04/24/2019  . Anxiety and depression 04/15/2018  . 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 Cancer of tonsillar fossa ([place]) 09/20/2017  . 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 Aneurysm of infrarenal abdominal aorta (HCC) 07/10/2016  . Normocytic anemia 07/10/2016  . Essential hypertension 05/19/2013  . Osteoarthritis 05/29/2009  . Psoriasis 01/13/2009  . Coronary artery disease 09/15/2008  . Severe aortic stenosis 01/28/2008  . Diverticulosis of colon 08/27/2007  . History of  benign prostatic hyperplasia 08/27/2007  . Hyperlipidemia 03/19/2007  . Gastroesophageal reflux disease 03/19/2007    Past Medical History:  Diagnosis Date  . Abdominal aneurysm ([place])   . Arthritis    "all over" (07/19/2016)  . BPH (benign prostatic hypertrophy)   . CAD (coronary artery disease)   . Chicken pox   . Chronic lower back pain    s/p surgical fusion  . Depression   . Diverticulosis   . Esophageal stricture   . GERD (gastroesophageal reflux disease)   . Hepatitis B 1984  . Hiatal hernia   . History of radiation therapy 01/16/18- 03/05/18   Left Tonsil, 66 Gy in 33 fractions to high risk nodal echelons.   Marland Kitchen HLD (hyperlipidemia)   . HTN (hypertension)   . 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 Osteoarthritis   . 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 Burt Knack and Dr Cyndia Bent   . Severe aortic stenosis   . TIA (transient ischemic attack) 1990s   hx  . tonsillar ca dx'd 11/2017  . Tubular adenoma of colon 2009    Family History  Problem Relation Age of Onset  . Heart disease Father 22       Living  . Coronary artery disease Father        CABG  . Alzheimer's disease Mother 86       Deceased  . Arthritis Mother   . Aneurysm Brother   . Stomach cancer Maternal Uncle   . Brain cancer Maternal Aunt        x2  . Obesity Daughter        Had Bypass  Sx   Past Surgical History:  Procedure Laterality Date  . BACK SURGERY    . CARDIAC CATHETERIZATION    . CATARACT EXTRACTION W/ INTRAOCULAR LENS  IMPLANT, BILATERAL Bilateral 01/2012 - 02/2012  . CORONARY ATHERECTOMY N/A 04/27/2020   Procedure: CORONARY ATHERECTOMY;  Surgeon: Sherren Mocha, MD;  Location: [place] [facility];  Service: Cardiovascular;  Laterality: N/A;  . CORONARY STENT INTERVENTION N/A 04/27/2020   Procedure: CORONARY STENT INTERVENTION;  Surgeon: Sherren Mocha, MD;  Location: [place] [facility];  Service:  Cardiovascular;  Laterality: N/A;  . DIRECT LARYNGOSCOPY Left 10/09/2017   Procedure: DIRECT LARYNGOSCOPY WITH BOPSY;  Surgeon: Jodi Marble, MD;  Location: [place];  Service: ENT;  Laterality: Left;  . ESOPHAGOGASTRODUODENOSCOPY (EGD) WITH ESOPHAGEAL DILATION     "couple times" (07/19/2016)  . ESOPHAGOSCOPY Left 10/09/2017   Procedure: ESOPHAGOSCOPY;  Surgeon: Jodi Marble, MD;  Location: [place];  Service: ENT;  Laterality: Left;  . INTRAVASCULAR IMAGING/OCT N/A 04/27/2020   Procedure: INTRAVASCULAR IMAGING/OCT;  Surgeon: Sherren Mocha, MD;  Location: [place] [facility];  Service: Cardiovascular;  Laterality: N/A;  . INTRAVASCULAR PRESSURE WIRE/FFR STUDY N/A 04/27/2020   Procedure: INTRAVASCULAR PRESSURE WIRE/FFR STUDY;  Surgeon: Sherren Mocha, MD;  Location: [place] [facility];  Service: Cardiovascular;  Laterality: N/A;  . IR GASTROSTOMY TUBE MOD SED  01/08/2018  . IR THORACENTESIS ASP PLEURAL SPACE W/IMG GUIDE  07/19/2016  . LAPAROSCOPIC CHOLECYSTECTOMY  1994  . LUMBAR DISC SURGERY  05/1996   L4-5; Dr. Sherwood Gambler   . LUMBAR LAMINECTOMY/DECOMPRESSION MICRODISCECTOMY  10/2002   L3-4. Dr. Sherwood Gambler  . MULTIPLE TOOTH EXTRACTIONS  1980s  . PARTIAL GLOSSECTOMY  12/02/2017   Dr. Nicolette Bang- [facility]  . pharyngoplasty for closure of tingue base defect  12/02/2017   Dr. Nicolette Bang- [facility]  . PICC LINE INSERTION  07/15/2016  . POSTERIOR LUMBAR FUSION  09/1996   Ray cage, L4-5 Dr. Rita Ohara  . PROSTATE BIOPSY  ~ 2017  . radical tonsillectomy Left 12/02/2017   Dr[facility]ico[facility] at [facility]  . RIGHT HEART CATH AND CORONARY ANGIOGRAPHY N/A 03/31/2020   Procedure: RIGHT HEART CATH AND CORONARY ANGIOGRAPHY;  Surgeon: Larey Dresser, MD;  Location: [place] [facility];  Service: Cardiovascular;  Laterality: N/A;  . RIGID BRONCHOSCOPY Left 10/09/2017   Procedure: RIGID BRONCHOSCOPY;  Surgeon: Jodi Marble, MD;  Location: [place];  Service: ENT;  Laterality: Left;  .  TEE WITHOUT CARDIOVERSION N/A 06/07/2020   Procedure: TRANSESOPHAGEAL ECHOCARDIOGRAM (TEE);  Surgeon: Sherren Mocha, MD;  Location: [place];  Service: Open Heart Surgery;  Laterality: N/A;  . TONSILLECTOMY    . TRACHEOSTOMY  12/02/2017   Dr. Nicolette Bang- [facility]  . ULTRASOUND GUIDANCE FOR VASCULAR ACCESS Right 06/07/2020   Procedure: ULTRASOUND GUIDANCE FOR VASCULAR ACCESS;  Surgeon: Sherren Mocha, MD;  Location: [place];  Service: Open Heart Surgery;  Laterality: Right;  Marland Kitchen VASCULAR SURGERY     Social History   Social History Narrative   ** Merged History Encounter **       Married (3rd), [place]. 2 children from 1st marriage, 4 step children.    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       Cell # [id number]   Immunization History  Administered Date(s) Administered  . Fluad Quad(high Dose 65+) 10/15/2018, 11/06/2019  . Influenza Split 11/26/2010, 10/11/2011, 11/03/2012  . Influenza Whole 11/14/2006, 12/04/2007, 10/06/2008, 10/25/2009  .  Influenza, High Dose Seasonal PF 11/01/2016, 10/30/2017  . Influenza, Seasonal, Injecte, Preservative Fre 12/07/2014  . Influenza,inj,Quad PF,6+ Mos 11/04/2015  . Influenza-Unspecified 11/19/2013  . PFIZER(Purple Top)SARS-COV-2 Vaccination 02/25/2019, 03/18/2019, 11/16/2019  . Pneumococcal Conjugate-13 12/07/2014  . Pneumococcal Polysaccharide-23 05/30/2017  . Tdap 01/28/2020     Objective: Vital Signs: There were no vitals taken for this visit.   Physical Exam   Musculoskeletal Exam: ***  CDAI Exam: CDAI Score: -- Patient Global: --; Provider Global: -- Swollen: --; Tender: -- Joint Exam 06/27/2020   No joint exam has been documented for this visit   There is currently no information documented on the homunculus. Go to the Rheumatology activity and complete the homunculus joint exam.  Investigation: No additional findings.  Imaging: DG Chest 2  View  Result Date: 06/07/2020 CLINICAL DATA:  80 year old male under preoperative evaluation prior to aortic valve replacement. EXAM: CHEST - 2 VIEW COMPARISON:  Chest x-ray 07/19/2016. FINDINGS: Lung volumes are normal. No consolidative airspace disease. No pleural effusions. No pneumothorax. No pulmonary nodule or mass noted. Pulmonary vasculature and the cardiomediastinal silhouette are within normal limits. Atherosclerosis in the thoracic aorta. IMPRESSION: 1.  No radiographic evidence of acute cardiopulmonary disease. 2. Aortic atherosclerosis. Electronically Signed   By: Vinnie Langton M.D.   On: 06/07/2020 09:23   ECHO TEE  Result Date: 06/07/2020    TRANSESOPHOGEAL ECHO REPORT   Patient Name:   Brandon [name] Date of Exam: 06/07/2020 Medical Rec #:  [id number]    Height:       67.0 in Accession #:    [id number]   Weight:       175.1 lb Date of Birth:  1940-03-19    BSA:          1.911 m Patient Age:    48 years     BP:           134/72 mmHg Patient Gender: M            HR:           62 bpm. Exam Location:  Inpatient Procedure: 3D Echo, TEE-Intraopertive, Cardiac Doppler and Color Doppler Indications:     I35.2 Nonrheumatic aortic (valve) stenosis with insufficiency  History:         Patient has prior history of Echocardiogram examinations, most                  recent 08/21/2020. CAD, TIA, Aortic Valve Disease,                  Signs/Symptoms:Chest Pain; Risk Factors:Current Smoker and                  Hypertension. Severe aortic stenosis. Cancer.  Sonographer:     Roseanna Rainbow RDCS Referring Phys:  [place] Diagnosing Phys: Jenkins Rouge MD  Sonographer Comments: TAVR procedure. PROCEDURE: After discussion of the risks and benefits of a TEE, an informed consent was obtained from the patient. The patient was intubated. The transesophogeal probe was passed without difficulty through the esophogus of the patient. Imaged were obtained with the patient in a supine position. Sedation performed by different  physician. The patient was monitored while under deep sedation. Image quality was excellent. The patient developed Respiratory depression during the procedure. Continuous sedation. IMPRESSIONS  1. Left ventricular ejection fraction, by estimation, is 60 to 65%. The left ventricle has normal function. There is mild left ventricular hypertrophy.  2. Right ventricular systolic function is normal.  The right ventricular size is normal.  3. Left atrial size was mildly dilated. No left atrial/left atrial appendage thrombus was detected.  4. The mitral valve is degenerative. Mild mitral valve regurgitation.  5. Tricuspid valve regurgitation is mild to moderate.  6. Pre TAVR: severe calcific AS with tri leaflet valve. mean gradient 41 mmHg peak 76 mmHg with mild tomoderate AR. AVA 1.2 cm2 o.71 /BSA         Post TAVR: well positioned 29 mm Sapien 2 valve Trivial PVL seen at base of anterior mitral leaflet mean gradient 6 peak 12 mmHg AVA 3.1 cm2 1.7 cm2/m2. The aortic valve has been repaired/replaced. Aortic valve regurgitation is mild to moderate. Severe aortic valve stenosis. FINDINGS  Left Ventricle: Left ventricular ejection fraction, by estimation, is 60 to 65%. The left ventricle has normal function. The left ventricular internal cavity size was small. There is mild left ventricular hypertrophy. Right Ventricle: The right ventricular size is normal. No increase in right ventricular wall thickness. Right ventricular systolic function is normal. Left Atrium: Left atrial size was mildly dilated. No left atrial/left atrial appendage thrombus was detected. Right Atrium: Right atrial size was normal in size. Pericardium: There is no evidence of pericardial effusion. Mitral Valve: The mitral valve is degenerative in appearance. There is mild thickening of the mitral valve leaflet(s). There is mild calcification of the mitral valve leaflet(s). Mild mitral annular calcification. Mild mitral valve regurgitation. Tricuspid Valve:  The tricuspid valve is normal in structure. Tricuspid valve regurgitation is mild to moderate. Aortic Valve: Pre TAVR: severe calcific AS with tri leaflet valve. mean gradient 41 mmHg peak 76 mmHg with mild tomoderate AR. AVA 1.2 cm2 o.71 /BSA Post TAVR: well positioned 29 mm Sapien 2 valve Trivial PVL seen at base of anterior mitral leaflet mean gradient 6 peak 12 mmHg AVA 3.1 cm2 1.7 cm2/m2. The aortic valve has been repaired/replaced. Aortic valve regurgitation is mild to moderate. Severe aortic stenosis is present. Aortic valve mean gradient measures 27.0 mmHg. Aortic valve peak gradient measures 43.4 mmHg. Aortic valve area, by VTI measures 1.69 cm. Pulmonic Valve: The pulmonic valve was normal in structure. Pulmonic valve regurgitation is mild. Aorta: The aortic root is normal in size and structure. IAS/Shunts: No atrial level shunt detected by color flow Doppler.  LEFT VENTRICLE PLAX 2D LVOT diam:     2.40 cm LV SV:         131 LV SV Index:   68 LVOT Area:     4.52 cm  AORTIC VALVE AV Area (Vmax):    1.88 cm AV Area (Vmean):   1.76 cm AV Area (VTI):     1.69 cm AV Vmax:           329.33 cm/s AV Vmean:          233.667 cm/s AV VTI:            0.775 m AV Peak Grad:      43.4 mmHg AV Mean Grad:      27.0 mmHg LVOT Vmax:         136.67 cm/s LVOT Vmean:        90.733 cm/s LVOT VTI:          0.289 m LVOT/AV VTI ratio: 0.37  SHUNTS Systemic VTI:  0.29 m Systemic Diam: 2.40 cm Jenkins Rouge MD Electronically signed by Jenkins Rouge MD Signature Date/Time: 06/07/2020/10:27:40 AM    Final    ECHOCARDIOGRAM LIMITED  Result Date: 06/08/2020  ECHOCARDIOGRAM REPORT   Patient Name:   Brandon [name] Date of Exam: 06/08/2020 Medical Rec #:  [id number]    Height:       67.0 in Accession #:    [id number]   Weight:       174.6 lb Date of Birth:  October 31, 1940    BSA:          1.909 m Patient Age:    47 years     BP:           148/61 mmHg Patient Gender: M            HR:           60 bpm. Exam Location:  Inpatient Procedure: 3D  Echo, Limited Echo, Cardiac Doppler and Color Doppler Indications:    I35.0 Nonrheumatic aortic (valve) stenosis. Post TAVR.  History:        Patient has prior history of Echocardiogram examinations, most                 recent 06/07/2020. CAD, TIA, Aortic Valve Disease; Risk                 Factors:Hypertension and Dyslipidemia. Severe aortic stenosis.                 S/P TAVR using 6mm Edwards Sapien 2 valve.  Sonographer:    Roseanna Rainbow RDCS Referring Phys: [id number] [place]  1. Left ventricular ejection fraction, by estimation, is 60 to 65%. The left ventricle has normal function. The left ventricle has no regional wall motion abnormalities. There is severe asymmetric left ventricular hypertrophy of the basal and septal segments. Left ventricular diastolic parameters were normal.  2. Right ventricular systolic function is normal. The right ventricular size is normal.  3. Left atrial size was moderately dilated.  4. The mitral valve is abnormal. Mild mitral valve regurgitation. No evidence of mitral stenosis.  5. Post TAVR with 29 mm Sapien 3 valve no significant PVL mean gradient 8 peak 17 mmHg AVA 5.6 cm2 DVI 0.91 . The aortic valve is normal in structure. Aortic valve regurgitation is not visualized. No aortic stenosis is present.  6. Aortic dilatation noted. There is mild dilatation of the ascending aorta, measuring 41 mm.  7. The inferior vena cava is normal in size with greater than 50% respiratory variability, suggesting right atrial pressure of 3 mmHg. FINDINGS  Left Ventricle: Left ventricular ejection fraction, by estimation, is 60 to 65%. The left ventricle has normal function. The left ventricle has no regional wall motion abnormalities. The left ventricular internal cavity size was normal in size. There is  severe asymmetric left ventricular hypertrophy of the basal and septal segments. Left ventricular diastolic parameters were normal. Right Ventricle: The right ventricular size is  normal. No increase in right ventricular wall thickness. Right ventricular systolic function is normal. Left Atrium: Left atrial size was moderately dilated. Right Atrium: Right atrial size was normal in size. Pericardium: There is no evidence of pericardial effusion. Mitral Valve: The mitral valve is abnormal. There is mild thickening of the mitral valve leaflet(s). There is mild calcification of the mitral valve leaflet(s). Mild mitral annular calcification. Mild mitral valve regurgitation. No evidence of mitral valve stenosis. Tricuspid Valve: The tricuspid valve is normal in structure. Tricuspid valve regurgitation is not demonstrated. No evidence of tricuspid stenosis. Aortic Valve: Post TAVR with 29 mm Sapien 3 valve no significant PVL mean gradient 8 peak 17 mmHg AVA 5.6  cm2 DVI 0.91. The aortic valve is normal in structure. Aortic valve regurgitation is not visualized. No aortic stenosis is present. Aortic valve mean gradient measures 8.0 mmHg. Aortic valve peak gradient measures 17.1 mmHg. Aortic valve area, by VTI measures 6.02 cm. There is a 29 mm Sapien prosthetic, stented (TAVR) valve present in the aortic position. Pulmonic Valve: The pulmonic valve was normal in structure. Pulmonic valve regurgitation is not visualized. No evidence of pulmonic stenosis. Aorta: The aortic root is normal in size and structure and aortic dilatation noted. There is mild dilatation of the ascending aorta, measuring 41 mm. Venous: The inferior vena cava is normal in size with greater than 50% respiratory variability, suggesting right atrial pressure of 3 mmHg. IAS/Shunts: No atrial level shunt detected by color flow Doppler.  LEFT VENTRICLE PLAX 2D LVIDd:         3.70 cm LVIDs:         2.30 cm LV PW:         2.10 cm LV IVS:        1.80 cm LVOT diam:     2.90 cm LV SV:         278 LV SV Index:   146 LVOT Area:     6.61 cm  LV Volumes (MOD) LV vol d, MOD A2C: 91.6 ml LV vol d, MOD A4C: 105.0 ml LV vol s, MOD A2C: 31.4 ml LV  vol s, MOD A4C: 29.2 ml LV SV MOD A2C:     60.2 ml LV SV MOD A4C:     105.0 ml LV SV MOD BP:      70.5 ml IVC IVC diam: 2.00 cm LEFT ATRIUM            Index LA diam:      5.10 cm  2.67 cm/m LA Vol (A4C): 104.0 ml 54.48 ml/m  AORTIC VALVE AV Area (Vmax):    5.62 cm AV Area (Vmean):   6.81 cm AV Area (VTI):     6.02 cm AV Vmax:           207.00 cm/s AV Vmean:          132.000 cm/s AV VTI:            0.462 m AV Peak Grad:      17.1 mmHg AV Mean Grad:      8.0 mmHg LVOT Vmax:         176.00 cm/s LVOT Vmean:        136.000 cm/s LVOT VTI:          0.421 m LVOT/AV VTI ratio: 0.91  AORTA Ao Root diam: 4.10 cm Ao Asc diam:  4.10 cm MITRAL VALVE MV Area (PHT): 3.58 cm     SHUNTS MV Decel Time: 212 msec     Systemic VTI:  0.42 m MV E velocity: 131.00 cm/s  Systemic Diam: 2.90 cm MV A velocity: 87.30 cm/s MV E/A ratio:  1.50 Charlton Haws MD Electronically signed by Charlton Haws MD Signature Date/Time: 06/08/2020/8:44:56 AM    Final    Structural Heart Procedure  Result Date: 06/07/2020 See surgical note for result.   Recent Labs: Lab Results  Component Value Date   WBC 7.1 06/08/2020   HGB 9.7 (L) 06/08/2020   PLT PLATELET CLUMPS NOTED ON SMEAR, UNABLE TO ESTIMATE 06/08/2020   NA 138 06/08/2020   K 3.3 (L) 06/08/2020   CL 105 06/08/2020   CO2 28 06/08/2020   GLUCOSE 84 06/08/2020  BUN 7 (L) 06/08/2020   CREATININE 0.60 (L) 06/08/2020   BILITOT 0.9 06/06/2020   ALKPHOS 84 06/06/2020   AST 14 (L) 06/06/2020   ALT 11 06/06/2020   PROT 6.3 (L) 06/06/2020   ALBUMIN 3.4 (L) 06/06/2020   CALCIUM 8.4 (L) 06/08/2020   GFRAA 109 01/11/2020   QFTBGOLDPLUS NEGATIVE 01/11/2020    Speciality Comments: No specialty comments available.  Procedures:  No procedures performed Allergies: Celebrex [celecoxib]   Assessment / Plan:     Visit Diagnoses: No diagnosis found.  Orders: No orders of the defined types were placed in this encounter.  No orders of the defined types were placed in this  encounter.   Face-to-face time spent with patient was *** minutes. Greater than 50% of time was spent in counseling and coordination of care.  Follow-Up Instructions: No follow-ups on file.   Earnestine Mealing, CMA  Note - This record has been created using Editor, commissioning.  Chart creation errors have been sought, but may not always  have been located. Such creation errors do not reflect on  the standard of medical care.

## 2020-06-14 NOTE — Progress Notes (Signed)
HEART AND [place]                                     Cardiology Office Note:    Date:  06/15/2020   ID:  SHER GILPIN, DOB Oct 29, 1940, MRN [id number]  PCP:  Haydee Salter, MD  [facility] Cardiologist:  Loralie Champagne, MD / Dr. Burt Knack & Dr. Cyndia Bent (TAVR) [facility] Electrophysiologist:  None   Referring MD: Haydee Salter, MD   [facility] s/p TAVR   History of Present Illness:    Brandon [name] is a 80 y.o. 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   The patient has been followed for aortic stenosis with surveillance echo studies and close clinical care by Dr. Aundra Dubin. He has developed progressive and now severe aortic stenosis with mean gradient of 50 mmHg and calculated valve area of 0.96 cm. LVEF remains preserved at 60 to 65%. He underwent cardiac catheterization for preoperative assessment and was demonstrated to have chronic occlusion of the right coronary artery with left-to-right collaterals, severe calcific proximal LAD stenosis, and moderately severe long segment mid circumflex stenosis.The patient reported exertional chest pain and dyspnea. He was referred to the multidisciplinary valve team for consideration of SAVR+CABG or TAVR+PCI.Cardiac gated CTA of the heart revealedanatomical characteristics consistent with aortic stenosis suitable for treatment by transcatheter aortic valve replacement without any significant complicating features.CTA of the aorta and iliac vessels demonstrateda large infrarenal abdominal aortic aneurysm  which is increasing in size, currently measuring 5.0x 4.2 cm in diameter (previously 4.3 cm on 02/26/2017).Seen by Dr. Donzetta Matters with vascular who recommended repeat CT scan in 5-6 months and proceed with TAVR. HE felt he may be a candidate for endovascular repair in the future.     He was evaluated by the multidisciplinary valve team inlcuding Dr. Burt Knack and Dr Cyndia Bent who felt PCI+TAVR via the subclavian approach was the most appropriate course of action for the patient. He underwentsuccessful orbital atherectomy and stenting of severe calcific stenosis in the proximal LAD, procedure guided by OCT imaging, treated with a 4.0 x 12 mm resolute Onyx DES. There was moderate left circumflex stenosis with negative pressure wire analysis.He wascontinued on uninterrupted DAPT with aspirin and Plavix x 6 months.  He underwent successful TAVR with a 29 mm Edwards Sapien 3 THV via the subclavian approach on 06/07/20. Post operative echo showed EF 60%, normally functioning TAVR with a mean gradient of 8 mmHg and no PVL. He was continued on DAPT with aspirin and plavix. He was supplemented with potassium due to hypokalemia. He did have some shoulder pain after the procedure for which he had taken some extra pain pills.  Today he presents to clinic for follow up. Here alone. Is feeling a bit depressed today due to increased pain. He thinks that somehow the surgery has triggered a recurrence in his PMR which had previously been controlled by prednisone. Pain has gotten so bad that he has had to take some extra pain meds and will run out early. Has felt more fatigued and a little "  wobbly and off balance." No LE edema, orthopnea or PND. No chest pain. Continues to have some exertional dyspnea. No dizziness or syncope.    Past Medical History:  Diagnosis Date  . Abdominal aneurysm ([place])   . Arthritis    "all over" (07/19/2016)  . BPH (benign prostatic hypertrophy)   . CAD (coronary artery disease)   . Chicken pox   .  Chronic lower back pain    s/p surgical fusion  . Depression   . Diverticulosis   . Esophageal stricture   . GERD (gastroesophageal reflux disease)   . Hepatitis B 1984  . Hiatal hernia   . History of radiation therapy 01/16/18- 03/05/18   Left Tonsil, 66 Gy in 33 fractions to high risk nodal echelons.   Marland Kitchen HLD (hyperlipidemia)   . HTN (hypertension)   . 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 Osteoarthritis   . 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 Burt Knack and Dr Cyndia Bent   . Severe aortic stenosis   . TIA (transient ischemic attack) 1990s   hx  . tonsillar ca dx'd 11/2017  . Tubular adenoma of colon 2009    Past Surgical History:  Procedure Laterality Date  . BACK SURGERY    . CARDIAC CATHETERIZATION    . CATARACT EXTRACTION W/ INTRAOCULAR LENS  IMPLANT, BILATERAL Bilateral 01/2012 - 02/2012  . CORONARY ATHERECTOMY N/A 04/27/2020   Procedure: CORONARY ATHERECTOMY;  Surgeon: Sherren Mocha, MD;  Location: [place] [facility];  Service: Cardiovascular;  Laterality: N/A;  . CORONARY STENT INTERVENTION N/A 04/27/2020   Procedure: CORONARY STENT INTERVENTION;  Surgeon: Sherren Mocha, MD;  Location: [place] [facility];  Service: Cardiovascular;  Laterality: N/A;  . DIRECT LARYNGOSCOPY Left 10/09/2017   Procedure: DIRECT LARYNGOSCOPY WITH BOPSY;  Surgeon: Jodi Marble, MD;  Location: [place];  Service: ENT;  Laterality: Left;  . ESOPHAGOGASTRODUODENOSCOPY (EGD) WITH ESOPHAGEAL DILATION     "couple times" (07/19/2016)  . ESOPHAGOSCOPY Left 10/09/2017   Procedure: ESOPHAGOSCOPY;  Surgeon: Jodi Marble, MD;  Location: [place];  Service: ENT;  Laterality: Left;  . INTRAVASCULAR IMAGING/OCT N/A 04/27/2020   Procedure: INTRAVASCULAR IMAGING/OCT;  Surgeon: Sherren Mocha, MD;  Location: [place] [facility];  Service: Cardiovascular;  Laterality: N/A;  . INTRAVASCULAR PRESSURE  WIRE/FFR STUDY N/A 04/27/2020   Procedure: INTRAVASCULAR PRESSURE WIRE/FFR STUDY;  Surgeon: Sherren Mocha, MD;  Location: [place] [facility];  Service: Cardiovascular;  Laterality: N/A;  . IR GASTROSTOMY TUBE MOD SED  01/08/2018  . IR THORACENTESIS ASP PLEURAL SPACE W/IMG GUIDE  07/19/2016  . LAPAROSCOPIC CHOLECYSTECTOMY  1994  . LUMBAR DISC SURGERY  05/1996   L4-5; Dr. Sherwood Gambler   . LUMBAR LAMINECTOMY/DECOMPRESSION MICRODISCECTOMY  10/2002   L3-4. Dr. Sherwood Gambler  . MULTIPLE TOOTH EXTRACTIONS  1980s  . PARTIAL GLOSSECTOMY  12/02/2017   Dr. Nicolette Bang- [facility]  . pharyngoplasty for closure of tingue base defect  12/02/2017   Dr. Nicolette Bang- [facility]  . PICC LINE INSERTION  07/15/2016  . POSTERIOR LUMBAR FUSION  09/1996   Ray cage, L4-5 Dr. Rita Ohara  . PROSTATE BIOPSY  ~ 2017  . radical tonsillectomy Left 12/02/2017   Dr. Nicolette Bang at [facility]  . RIGHT HEART CATH AND CORONARY ANGIOGRAPHY N/A 03/31/2020   Procedure: RIGHT HEART CATH AND CORONARY ANGIOGRAPHY;  Surgeon: Larey Dresser, MD;  Location: [place] [facility];  Service: Cardiovascular;  Laterality: N/A;  .  RIGID BRONCHOSCOPY Left 10/09/2017   Procedure: RIGID BRONCHOSCOPY;  Surgeon: Jodi Marble, MD;  Location: [place];  Service: ENT;  Laterality: Left;  . TEE WITHOUT CARDIOVERSION N/A 06/07/2020   Procedure: TRANSESOPHAGEAL ECHOCARDIOGRAM (TEE);  Surgeon: Sherren Mocha, MD;  Location: [place];  Service: Open Heart Surgery;  Laterality: N/A;  . TONSILLECTOMY    . TRACHEOSTOMY  12/02/2017   Dr. Nicolette Bang- [facility]  . ULTRASOUND GUIDANCE FOR VASCULAR ACCESS Right 06/07/2020   Procedure: ULTRASOUND GUIDANCE FOR VASCULAR ACCESS;  Surgeon: Sherren Mocha, MD;  Location: [place];  Service: Open Heart Surgery;  Laterality: Right;  Marland Kitchen VASCULAR SURGERY      Current Medications: Current Meds  Medication Sig  . amLODipine (NORVASC) 10 MG tablet Take 1 tablet (10 mg total) by mouth daily.  Marland Kitchen aspirin 81 MG EC tablet Take 81 mg by mouth at bedtime.   Marland Kitchen atorvastatin (LIPITOR) 80 MG tablet Take 1 tablet (80 mg total) by mouth daily.  . carvedilol (COREG) 6.25 MG tablet Take 1 tablet (6.25 mg total) by mouth 2 (two) times daily.  . citalopram (CELEXA) 20 MG tablet Take 1 tablet (20 mg total) by mouth daily.  . clopidogrel (PLAVIX) 75 MG tablet Take 1 tablet (75 mg total) by mouth daily.  Marland Kitchen ezetimibe (ZETIA) 10 MG tablet Take 1 tablet (10 mg total) by mouth daily.  . fluticasone (FLONASE) 50 MCG/ACT nasal spray Place 2 sprays into both nostrils daily as needed for rhinitis or allergies.  Marland Kitchen gabapentin (NEURONTIN) 300 MG capsule TAKE 1 CAPSULE(300 MG) BY MOUTH THREE TIMES DAILY  . isosorbide mononitrate (IMDUR) 30 MG 24 hr tablet TAKE 1 TABLET(30 MG) BY MOUTH AT BEDTIME  . lisinopril (ZESTRIL) 10 MG tablet TAKE 1 TABLET(10 MG) BY MOUTH DAILY  . nicotine (NICODERM CQ - DOSED IN MG/24 HOURS) 21 mg/24hr patch Place 21 mg onto the skin daily.  Marland Kitchen oxyCODONE (OXY IR/ROXICODONE) 5 MG immediate release tablet Take 1-2 tablets (5-10 mg total) by mouth every 6 (six) hours as needed for severe pain (to take 2 tabs ONLY when severe pain- which is not usual for pt).  . pantoprazole (PROTONIX) 40 MG tablet Take 1 tablet (40 mg total) by mouth daily.  . predniSONE (DELTASONE) 1 MG tablet Take 2 tablets p.o. every morning  . predniSONE (DELTASONE) 5 MG tablet Take 1 tablet (5 mg total) by mouth daily with breakfast.  . protein supplement shake (PREMIER PROTEIN) LIQD Take 11 oz by mouth in the morning.  . tamsulosin (FLOMAX) 0.4 MG CAPS capsule Take 0.4 mg by mouth at bedtime.  . traMADol (ULTRAM) 50 MG tablet Take 2 tablets (100 mg total) by mouth 2 (two) times daily.  Marland Kitchen trolamine salicylate (ASPERCREME) 10 % cream Apply 1 application topically as needed for muscle pain.     Allergies:   Celebrex [celecoxib]   Social History   Socioeconomic History  . Marital status: Married    Spouse name: etta  . Number of children: 2  . Years of education: Not on file   . Highest education level: Not on file  Occupational History  . Occupation: retired    Fish farm manager: RETIRED    Comment: disabled due to back problems  Tobacco Use  . Smoking status: Former Smoker    Packs/day: 1.50    Years: 50.00    Pack years: 75.00    Types: Cigarettes    Quit date: 09/05/2017    Years since quitting: 2.7  . Smokeless tobacco: Never Used  .  Tobacco comment: smoked less than 1 ppd for 40+ years; Using Vapor Cig  Vaping Use  . Vaping Use: Never used  Substance and Sexual Activity  . Alcohol use: Yes    Alcohol/week: 0.0 standard drinks    Comment: Occasionally,not a heavy drinker   . Drug use: Yes    Types: Oxycodone  . Sexual activity: Never  Other Topics Concern  . Not on file  Social History Narrative   ** Merged History Encounter **       Married (3rd), [place]. 2 children from 1st marriage, 4 step children.    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       Cell # [phone]   Social Determinants of Health   Financial Resource Strain: Low Risk   . Difficulty of Paying Living Expenses: Not hard at all  Food Insecurity: No Food Insecurity  . Worried About Charity fundraiser in the Last Year: Never true  . Ran Out of Food in the Last Year: Never true  Transportation Needs: No Transportation Needs  . Lack of Transportation (Medical): No  . Lack of Transportation (Non-Medical): No  Physical Activity: Inactive  . Days of Exercise per Week: 0 days  . Minutes of Exercise per Session: 0 min  Stress: No Stress Concern Present  . Feeling of Stress : Not at all  Social Connections: Moderately Isolated  . Frequency of Communication with Friends and Family: More than three times a week  . Frequency of Social Gatherings with Friends and Family: Once a week  . Attends Religious Services: Never  . Active Member of Clubs or Organizations: No  . Attends Archivist  Meetings: Never  . Marital Status: Married     Family History: The patient's family history includes Alzheimer's disease (age of onset: 40) in his mother; Aneurysm in his brother; Arthritis in his mother; Brain cancer in his maternal aunt; Coronary artery disease in his father; Heart disease (age of onset: 66) in his father; Obesity in his daughter; Stomach cancer in his maternal uncle.  ROS:   Please see the history of present illness.    All other systems reviewed and are negative.  EKGs/Labs/Other Studies Reviewed:    The following studies were reviewed today:   TAVR OPERATIVE NOTE   Date of Procedure:06/07/2020  Preoperative Diagnosis:Severe Aortic Stenosis   Postoperative Diagnosis:Same   Procedure:   Transcatheter Aortic Valve Replacement -Left Subclavian ArteryApproach Edwards Sapien 3 THV (size 84mm, model # 9600TFX, serial #[id number])  Co-Surgeons:Bryan Alveria Apley, MD and Sherren Mocha, MD   Anesthesiologist:David Linna Caprice, MD  Echocardiographer:Peter Johnsie Cancel, MD  Pre-operative Echo Findings: ? Severe aortic stenosis, Mild aortic insufficiency ? Normalleft ventricular systolic function  Post-operative Echo Findings: ? Trivialparavalvular leak ? Normalleft ventricular systolic function  _____________   Echo 06/08/20: IMPRESSIONS  1. Left ventricular ejection fraction, by estimation, is 60 to 65%. The  left ventricle has normal function. The left ventricle has no regional  wall motion abnormalities. There is severe asymmetric left ventricular  hypertrophy of the basal and septal segments. Left ventricular diastolic parameters were normal.  2. Right ventricular systolic function is normal. The right ventricular  size is normal.  3. Left atrial size was moderately dilated.  4. The mitral valve is abnormal. Mild mitral valve  regurgitation. No  evidence of mitral stenosis.  5. Post TAVR with 29 mm Sapien 3 valve no  significant PVL mean gradient 8 peak 17 mmHg AVA 5.6 cm2 DVI 0.91 . The aortic valve is normal in  structure. Aortic valve regurgitation is not visualized. No aortic  stenosis is present.  6. Aortic dilatation noted. There is mild dilatation of the ascending  aorta, measuring 41 mm.  7. The inferior vena cava is normal in size with greater than 50%  respiratory variability, suggesting right atrial pressure of 3 mmHg.    EKG:  EKG is ordered today.  The ekg ordered today demonstrates sinus HR 72  Recent Labs: 03/25/2020: TSH 1.569 06/06/2020: ALT 11 06/08/2020: BUN 7; Creatinine, Ser 0.60; Hemoglobin 9.7; Magnesium 1.8; Platelets PLATELET CLUMPS NOTED ON SMEAR, UNABLE TO ESTIMATE; Potassium 3.3; Sodium 138  Recent Lipid Panel    Component Value Date/Time   CHOL 160 05/23/2020 1149   TRIG 98 05/23/2020 1149   HDL 55 05/23/2020 1149   CHOLHDL 2.9 05/23/2020 1149   VLDL 20 05/23/2020 1149   LDLCALC 85 05/23/2020 1149   LDLDIRECT 81.2 05/25/2008 0906     Risk Assessment/Calculations:       Physical Exam:    VS:  BP (!) 136/58 (BP Location: Left Arm, Patient Position: Sitting, Cuff Size: Normal)   Pulse 72   Ht 5\' 7"  (1.702 m)   Wt 168 lb (76.2 kg)   SpO2 97%   BMI 26.31 kg/m     Wt Readings from Last 3 Encounters:  06/15/20 168 lb (76.2 kg)  06/08/20 174 lb 9.7 oz (79.2 kg)  06/06/20 175 lb 1 oz (79.4 kg)     GEN: Well nourished, well developed in no acute distress HEENT: Normal NECK: No JVD LYMPHATICS: No lymphadenopathy CARDIAC: RRR, soft flow murmur. No rubs, gallops RESPIRATORY:  Clear to auscultation without rales, wheezing or rhonchi  ABDOMEN: Soft, non-tender, non-distended MUSCULOSKELETAL:  No edema; No deformity  SKIN: Warm and dry. Subclavian site healing well with very mild hematoma and resolving ecchymosis over chest and arm NEUROLOGIC:  Alert and oriented x  3 PSYCHIATRIC:  Normal affect   ASSESSMENT:    1. S/P TAVR (transcatheter aortic valve replacement)   2. Coronary artery disease involving native coronary artery of native heart without angina pectoris   3. Aneurysm of infrarenal abdominal aorta (HCC)   4. Essential hypertension   5. Hypokalemia   6. Polymyalgia rheumatica (HCC)    PLAN:    In order of problems listed above:  Severe AS s/p TAVR:doing okay 1 week out from TAVR. Feeling a bit depressed due to increased pain. ECG with no HAVB. Continue on aspirin and plavix. SBE prophylaxis discussed; the patient is edentulous and does not go to the dentist. I will see him back for 1 month follow up and echo.   CAD with angina: he underwent successful orbital atherectomy and stenting of severe calcific stenosis in the proximal LAD, procedure guided by OCT imaging, treated with a 4.0 x 12 mm resolute Onyx DES. There was moderate left circumflex stenosis with negative pressure wire analysis.He will be continued on uninterrupted DAPT with aspirin and Plavix x 6 months. Continue statin, BB and nitrates.No chest pain.   AAA: pre TAVR CT showeda large infrarenal abdominal aortic aneurysm which is increasing in size, currently measuring 5.0x 4.2 cm in diameter (previously 4.3 cm on 02/26/2017). Followed by Dr. Donzetta Matters.   HTN:BP well controlled today.   Hypokalemia: will check BMET.   Polymyalgia rheumatica with chronic pain: pt has been on chronic prednisone for this and had recently been  tapered down. He feels like surgery has somehow sparked a flare for him with his chronic pain. Also had significant shoulder pain at subclavian incision site. He has had to take a few more pain pills and is worried he will run out early. I asked him to call his rheumatologist to see if his steroids can be increased temporarily. Also, I will reach out to his pain management MD, Dr. Dagoberto Ligas, and see if he can be supplemented with more pain medication to get him  through this surgery.    Medication Adjustments/Labs and Tests Ordered: Current medicines are reviewed at length with the patient today.  Concerns regarding medicines are outlined above.  Orders Placed This Encounter  Procedures  . Basic metabolic panel  . EKG 12-Lead   No orders of the defined types were placed in this encounter.   Patient Instructions  Medication Instructions:  Your provider recommends that you continue on your current medications as directed. Please refer to the Current Medication list given to you today.   *If you need a refill on your cardiac medications before your next appointment, please call your pharmacy*  Lab Work: TODAY! BMET If you have labs (blood work) drawn today and your tests are completely normal, you will receive your results only by: Marland Kitchen MyChart Message (if you have MyChart) OR . A paper copy in the mail If you have any lab test that is abnormal or we need to change your treatment, we will call you to review the results.  Follow-Up: Please keep your appointments as scheduled!    Signed, Angelena Form, PA-C  06/15/2020 2:56 PM    [facility]

## 2020-06-15 ENCOUNTER — Encounter: Payer: Self-pay | Admitting: Physician Assistant

## 2020-06-15 ENCOUNTER — Ambulatory Visit: Payer: Medicare Other | Admitting: Physician Assistant

## 2020-06-15 ENCOUNTER — Other Ambulatory Visit: Payer: Self-pay

## 2020-06-15 VITALS — BP 136/58 | HR 72 | Ht 67.0 in | Wt 168.0 lb

## 2020-06-15 DIAGNOSIS — M353 Polymyalgia rheumatica: Secondary | ICD-10-CM

## 2020-06-15 DIAGNOSIS — I2510 Atherosclerotic heart disease of native coronary artery without angina pectoris: Secondary | ICD-10-CM

## 2020-06-15 DIAGNOSIS — E876 Hypokalemia: Secondary | ICD-10-CM

## 2020-06-15 DIAGNOSIS — I10 Essential (primary) hypertension: Secondary | ICD-10-CM

## 2020-06-15 DIAGNOSIS — Z952 Presence of prosthetic heart valve: Secondary | ICD-10-CM

## 2020-06-15 DIAGNOSIS — I714 Abdominal aortic aneurysm, without rupture: Secondary | ICD-10-CM

## 2020-06-15 DIAGNOSIS — I7143 Infrarenal abdominal aortic aneurysm, without rupture: Secondary | ICD-10-CM

## 2020-06-15 NOTE — Patient Instructions (Signed)
Medication Instructions:  Your provider recommends that you continue on your current medications as directed. Please refer to the Current Medication list given to you today.   *If you need a refill on your cardiac medications before your next appointment, please call your pharmacy*  Lab Work: TODAY! BMET If you have labs (blood work) drawn today and your tests are completely normal, you will receive your results only by: MyChart Message (if you have MyChart) OR A paper copy in the mail If you have any lab test that is abnormal or we need to change your treatment, we will call you to review the results.  Follow-Up: Please keep your appointments as scheduled! 

## 2020-06-16 ENCOUNTER — Other Ambulatory Visit: Payer: Self-pay | Admitting: Physician Assistant

## 2020-06-16 LAB — BASIC METABOLIC PANEL
BUN/Creatinine Ratio: 32 — ABNORMAL HIGH (ref 10–24)
BUN: 19 mg/dL (ref 8–27)
CO2: 25 mmol/L (ref 20–29)
Calcium: 8.8 mg/dL (ref 8.6–10.2)
Chloride: 98 mmol/L (ref 96–106)
Creatinine, Ser: 0.59 mg/dL — ABNORMAL LOW (ref 0.76–1.27)
Glucose: 137 mg/dL — ABNORMAL HIGH (ref 65–99)
Potassium: 3.9 mmol/L (ref 3.5–5.2)
Sodium: 138 mmol/L (ref 134–144)
eGFR: 100 mL/min/{1.73_m2} (ref >59)

## 2020-06-16 MED ORDER — OXYCODONE HCL 5 MG PO TABS: 5.0000 mg | ORAL_TABLET | Freq: Four times a day (QID) | ORAL | 0 refills | Status: DC | PRN

## 2020-06-16 NOTE — Progress Notes (Signed)
Spoke with his pain management specialist, Dr Trudee Kuster, who was okay for me to supplement his pain medication with recent surgery. Pt was Rx'd 24 tablets which he said would get him through to his next pain clinic apt.   Angelena Form PA-C  [facility]

## 2020-06-16 NOTE — Progress Notes (Signed)
Thanks so much and I really appreciate you working with him- he's a GREAT guy! ML

## 2020-06-23 ENCOUNTER — Other Ambulatory Visit: Payer: Self-pay | Admitting: Rheumatology

## 2020-06-23 NOTE — Telephone Encounter (Signed)
Next Visit: 06/27/2020  Last Visit: 02/10/2020  Last Fill: 05/24/2020  Dx: Polymyalgia rheumatica   Current Dose per office note on 02/10/2020, Prednisone 10 mg p.o. daily.  Okay to refill prednisone?

## 2020-06-27 ENCOUNTER — Ambulatory Visit: Payer: Medicare Other | Admitting: Physician Assistant

## 2020-06-27 DIAGNOSIS — Z7952 Long term (current) use of systemic steroids: Secondary | ICD-10-CM

## 2020-06-27 DIAGNOSIS — R1312 Dysphagia, oropharyngeal phase: Secondary | ICD-10-CM

## 2020-06-27 DIAGNOSIS — M5136 Other intervertebral disc degeneration, lumbar region: Secondary | ICD-10-CM

## 2020-06-27 DIAGNOSIS — M353 Polymyalgia rheumatica: Secondary | ICD-10-CM

## 2020-06-27 DIAGNOSIS — I714 Abdominal aortic aneurysm, without rupture: Secondary | ICD-10-CM

## 2020-06-27 DIAGNOSIS — L409 Psoriasis, unspecified: Secondary | ICD-10-CM

## 2020-06-27 DIAGNOSIS — Z8639 Personal history of other endocrine, nutritional and metabolic disease: Secondary | ICD-10-CM

## 2020-06-27 DIAGNOSIS — Z8719 Personal history of other diseases of the digestive system: Secondary | ICD-10-CM

## 2020-06-27 DIAGNOSIS — I10 Essential (primary) hypertension: Secondary | ICD-10-CM

## 2020-06-27 DIAGNOSIS — Z79899 Other long term (current) drug therapy: Secondary | ICD-10-CM

## 2020-06-27 DIAGNOSIS — S61411S Laceration without foreign body of right hand, sequela: Secondary | ICD-10-CM

## 2020-06-27 DIAGNOSIS — Z720 Tobacco use: Secondary | ICD-10-CM

## 2020-06-27 DIAGNOSIS — F32A Depression, unspecified: Secondary | ICD-10-CM

## 2020-06-27 DIAGNOSIS — K750 Abscess of liver: Secondary | ICD-10-CM

## 2020-06-27 DIAGNOSIS — C090 Malignant neoplasm of tonsillar fossa: Secondary | ICD-10-CM

## 2020-06-27 DIAGNOSIS — M19041 Primary osteoarthritis, right hand: Secondary | ICD-10-CM

## 2020-06-27 DIAGNOSIS — G894 Chronic pain syndrome: Secondary | ICD-10-CM

## 2020-06-27 NOTE — Progress Notes (Signed)
 Office Visit Note  Patient: Brandon [name]             Date of Birth: 12/05/1940           MRN: [id number]             PCP: [name], Stephen M, MD Referring: Martin, William C, PA-C Visit Date: 07/05/2020 Occupation: @GUAROCC@  Subjective:  Medication monitoring   History of Present Illness: Brandon [name] is a 80 y.o. male with history of PMR, osteoarthritis, and DDD.  He denies any signs or symptoms of a polymyalgia rheumatica flare. He has been on long term prednisone 7 mg daily.  He had a recurrence of PMR symptoms in the past when he tried reducing prednisone to 6 mg daily.  He was not started on MTX at his last office visit in January 2022 due to the risk for infection. Patient reports that on 06/28/20 he started to experience severe pain in the right elbow joint.  He was evaluated in the ED on 06/30/20 and underwent an arthrocentesis performed by Dr. Dean.  The fluid analysis was consistent with gout.  He was started on prednisone 20 mg which he took for 2 days and then reduced to 10 mg.   He is concerned about the risks of long term prednisone use.  He plans on following up with Dr. Dean this week to discuss the next recommendations.    Activities of Daily Living:  Patient reports morning stiffness for several  hours.    Patient Denies nocturnal pain.  Difficulty dressing/grooming: Denies Difficulty climbing stairs: Denies Difficulty getting out of chair: Denies Difficulty using hands for taps, buttons, cutlery, and/or writing: Reports  Review of Systems  Constitutional: Negative for fatigue.  HENT: Positive for mouth dryness. Negative for mouth sores and nose dryness.   Eyes: Negative for pain, itching and dryness.  Respiratory: Negative for shortness of breath and difficulty breathing.   Cardiovascular: Negative for chest pain and palpitations.  Gastrointestinal: Negative for blood in stool, constipation and diarrhea.  Endocrine: Negative for increased urination.  Genitourinary:  Negative for difficulty urinating.  Musculoskeletal: Positive for arthralgias, joint pain, myalgias, morning stiffness, muscle tenderness and myalgias. Negative for joint swelling.  Skin: Negative for color change, rash and redness.  Allergic/Immunologic: Negative for susceptible to infections.  Neurological: Negative for dizziness, numbness, headaches, memory loss and weakness.  Hematological: Positive for bruising/bleeding tendency.  Psychiatric/Behavioral: Negative for confusion and sleep disturbance.    PMFS History:  Patient Active Problem List   Diagnosis Date Noted  . 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 TIA (transient ischemic attack)   . Esophageal stricture   . 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  . Polymyalgia rheumatica (HCC) 04/19/2020  . Primary osteoarthritis of both hands 01/11/2020  . Piriformis syndrome of both sides 09/23/2019  . Pain of both shoulder joints 07/29/2019  . Sciatica associated with disorder of lumbar spine 04/24/2019  . Primary osteoarthritis involving multiple joints 04/24/2019  . Chronic pain syndrome 04/24/2019  . Anxiety and depression 04/15/2018  . 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 Aneurysm of infrarenal abdominal aorta (HCC) 07/10/2016  . Normocytic anemia 07/10/2016  . Essential hypertension 05/19/2013  . Osteoarthritis 05/29/2009  . Psoriasis 01/13/2009  . Coronary artery disease 09/15/2008  . Severe aortic stenosis 01/28/2008  . Diverticulosis of colon 08/27/2007  . History of benign prostatic hyperplasia 08/27/2007  . Hyperlipidemia 03/19/2007  .   Gastroesophageal reflux disease 03/19/2007    Past Medical History:  Diagnosis Date  . Abdominal aneurysm (HCC)   . Arthritis    "all over" (07/19/2016)  . BPH (benign prostatic hypertrophy)   . CAD (coronary artery disease)   . Chicken pox   . Chronic lower back pain    s/p surgical  fusion  . Depression   . Diverticulosis   . Esophageal stricture   . GERD (gastroesophageal reflux disease)   . Hepatitis B 1984  . Hiatal hernia   . History of radiation therapy 01/16/18- 03/05/18   Left Tonsil, 66 Gy in 33 fractions to high risk nodal echelons.   . HLD (hyperlipidemia)   . HTN (hypertension)   . 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 Osteoarthritis   . 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 Cooper and Dr Bartle   . Severe aortic stenosis   . TIA (transient ischemic attack) 1990s   hx  . tonsillar ca dx'd 11/2017  . Tubular adenoma of colon 2009    Family History  Problem Relation Age of Onset  . Heart disease Father 97       Living  . Coronary artery disease Father        CABG  . Alzheimer's disease Mother 78       Deceased  . Arthritis Mother   . Aneurysm Brother   . Stomach cancer Maternal Uncle   . Brain cancer Maternal Aunt        x2  . Obesity Daughter        Had Bypass Sx   Past Surgical History:  Procedure Laterality Date  . BACK SURGERY    . CARDIAC CATHETERIZATION    . CATARACT EXTRACTION W/ INTRAOCULAR LENS  IMPLANT, BILATERAL Bilateral 01/2012 - 02/2012  . CORONARY ATHERECTOMY N/A 04/27/2020   Procedure: CORONARY ATHERECTOMY;  Surgeon: Cooper, Michael, MD;  Location: [facility];  Service: Cardiovascular;  Laterality: N/A;  . CORONARY STENT INTERVENTION N/A 04/27/2020   Procedure: CORONARY STENT INTERVENTION;  Surgeon: Cooper, Michael, MD;  Location: [facility];  Service: Cardiovascular;  Laterality: N/A;  . DIRECT LARYNGOSCOPY Left 10/09/2017   Procedure: DIRECT LARYNGOSCOPY WITH BOPSY;  Surgeon: Wolicki, Karol, MD;  Location: [facility];  Service: ENT;  Laterality: Left;  . ESOPHAGOGASTRODUODENOSCOPY (EGD) WITH ESOPHAGEAL DILATION     "couple times" (07/19/2016)  . ESOPHAGOSCOPY Left 10/09/2017   Procedure: ESOPHAGOSCOPY;   Surgeon: Wolicki, Karol, MD;  Location: [facility];  Service: ENT;  Laterality: Left;  . INTRAVASCULAR IMAGING/OCT N/A 04/27/2020   Procedure: INTRAVASCULAR IMAGING/OCT;  Surgeon: Cooper, Michael, MD;  Location: [facility];  Service: Cardiovascular;  Laterality: N/A;  . INTRAVASCULAR PRESSURE WIRE/FFR STUDY N/A 04/27/2020   Procedure: INTRAVASCULAR PRESSURE WIRE/FFR STUDY;  Surgeon: Cooper, Michael, MD;  Location: [facility];  Service: Cardiovascular;  Laterality: N/A;  . IR GASTROSTOMY TUBE MOD SED  01/08/2018  . IR THORACENTESIS ASP PLEURAL SPACE W/IMG GUIDE  07/19/2016  . LAPAROSCOPIC CHOLECYSTECTOMY  1994  . LUMBAR DISC SURGERY  05/1996   L4-5; Dr. Nudelman   . LUMBAR LAMINECTOMY/DECOMPRESSION MICRODISCECTOMY  10/2002   L3-4. Dr. Nudelman  . MULTIPLE TOOTH EXTRACTIONS  1980s  . PARTIAL GLOSSECTOMY  12/02/2017   Dr. Waltonen- [facility]  . pharyngoplasty for closure of tingue base defect  12/02/2017   Dr. Waltonen- [facility]  . PICC   LINE INSERTION  07/15/2016  . POSTERIOR LUMBAR FUSION  09/1996   Ray cage, L4-5 Dr. Rita Ohara  . PROSTATE BIOPSY  ~ 2017  . radical tonsillectomy Left 12/02/2017   Dr. [facility]ico[facility]  . RIGHT HEART CATH AND CORONARY ANGIOGRAPHY N/A 03/31/2020   Procedure: RIGHT HEART CATH AND CORONARY ANGIOGRAPHY;  Surgeon: Larey Dresser, MD;  Location: [facility];  Service: Cardiovascular;  Laterality: N/A;  . RIGID BRONCHOSCOPY Left 10/09/2017   Procedure: RIGID BRONCHOSCOPY;  Surgeon: Jodi Marble, MD;  Location: [place];  Service: ENT;  Laterality: Left;  . TEE WITHOUT CARDIOVERSION N/A 06/07/2020   Procedure: TRANSESOPHAGEAL ECHOCARDIOGRAM (TEE);  Surgeon: Sherren Mocha, MD;  Location: [place];  Service: Open Heart Surgery;  Laterality: N/A;  . TONSILLECTOMY    . TRACHEOSTOMY  12/02/2017   Dr. Nicolette [facility]- [facility]  . ULTRASOUND GUIDANCE FOR VASCULAR ACCESS Right 06/07/2020   Procedure: ULTRASOUND GUIDANCE FOR VASCULAR ACCESS;   Surgeon: Sherren Mocha, MD;  Location: [place];  Service: Open Heart Surgery;  Laterality: Right;  [facility] VASCULAR SURGERY     Social History   Social History Narrative   ** Merged History Encounter **       Married (3rd), [place]. 2 children from 1st marriage, 4 step children.    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       Cell # [id number]   Immunization History  Administered Date(s) Administered  . Fluad Quad(high Dose 65+) 10/15/2018, 11/06/2019  . Influenza Split 11/26/2010, 10/11/2011, 11/03/2012  . Influenza Whole 11/14/2006, 12/04/2007, 10/06/2008, 10/25/2009  . Influenza, High Dose Seasonal PF 11/01/2016, 10/30/2017  . Influenza, Seasonal, Injecte, Preservative Fre 12/07/2014  . Influenza,inj,Quad PF,6+ Mos 11/04/2015  . Influenza-Unspecified 11/19/2013  . PFIZER(Purple Top)SARS-COV-2 Vaccination 02/25/2019, 03/18/2019, 11/16/2019  . Pneumococcal Conjugate-13 12/07/2014  . Pneumococcal Polysaccharide-23 05/30/2017  . Tdap 01/28/2020     Objective: Vital Signs: BP 103/62 (BP Location: Left Arm, Patient Position: Sitting, Cuff Size: Normal)   Pulse (!) 59   Resp 17   Ht 5' 8" (1.727 m)   Wt 167 lb (75.8 kg)   BMI 25.39 kg/m    Physical Exam Vitals and nursing note reviewed.  Constitutional:      Appearance: He is well-developed.  HENT:     Head: Normocephalic and atraumatic.  Eyes:     Conjunctiva/sclera: Conjunctivae normal.     Pupils: Pupils are equal, round, and reactive to light.  Pulmonary:     Effort: Pulmonary effort is normal.  Abdominal:     Palpations: Abdomen is soft.  Musculoskeletal:     Cervical back: Normal range of motion and neck supple.  Skin:    General: Skin is warm and dry.     Capillary Refill: Capillary refill takes less than 2 seconds.     Findings: Bruising (Scattered ecchymosis on forearms and dorsal aspect of both hands) present.  Neurological:      Mental Status: He is alert and oriented to person, place, and time.  Psychiatric:        Behavior: Behavior normal.      Musculoskeletal Exam: C-spine limited ROM with lateral rotation.  No midline spinal tenderness or SI joint tenderness.  Shoulder joints good ROM with no discomfort.  Mild flexion contracture of the right elbow joint.  No tenderness or effusion over the right elbow. Left elbow has full flexion and extension with  no discomfort or tenderness.  Wrist joints have good ROM with no tenderness or synovitis.  PIP and DIP thickening consistent with osteoarthritis of both hands.  Complete fist formation bilaterally.  Knee joints good ROM with no warmth or effusion.  Ankle joints good ROM with no tenderness or joint swelling.   CDAI Exam: CDAI Score: -- Patient Global: --; Provider Global: -- Swollen: --; Tender: -- Joint Exam 07/05/2020   No joint exam has been documented for this visit   There is currently no information documented on the homunculus. Go to the Rheumatology activity and complete the homunculus joint exam.  Investigation: No additional findings.  Imaging: DG Chest 2 View  Result Date: 06/07/2020 CLINICAL DATA:  79-year-old male under preoperative evaluation prior to aortic valve replacement. EXAM: CHEST - 2 VIEW COMPARISON:  Chest x-ray 07/19/2016. FINDINGS: Lung volumes are normal. No consolidative airspace disease. No pleural effusions. No pneumothorax. No pulmonary nodule or mass noted. Pulmonary vasculature and the cardiomediastinal silhouette are within normal limits. Atherosclerosis in the thoracic aorta. IMPRESSION: 1.  No radiographic evidence of acute cardiopulmonary disease. 2. Aortic atherosclerosis. Electronically Signed   By: Daniel  Entrikin M.D.   On: 06/07/2020 09:23   DG Elbow Complete Right  Result Date: 06/30/2020 CLINICAL DATA:  Elbow pain EXAM: RIGHT ELBOW - COMPLETE 3+ VIEW COMPARISON:  None. FINDINGS: Positive for elbow effusion. No  definitive fracture or malalignment is seen. Moderate degenerative changes anteriorly and at the proximal radioulnar articulation. Probable soft tissue calcifications adjacent to the lateral epicondyle. IMPRESSION: No definite acute osseous abnormality, however elbow effusion is present. There are moderate degenerative changes. Electronically Signed   By: Kim  Fujinaga M.D.   On: 06/30/2020 21:57   MR FOREARM RIGHT W WO CONTRAST  Result Date: 07/01/2020 CLINICAL DATA:  Right forearm pain EXAM: MRI OF THE RIGHT FOREARM WITHOUT AND WITH CONTRAST TECHNIQUE: Multiplanar, multisequence MR imaging of the right forearm was performed before and after the administration of intravenous contrast. CONTRAST:  7.5mL GADAVIST GADOBUTROL 1 MMOL/ML IV SOLN COMPARISON:  Elbow x-ray 06/30/2020, elbow CT 07/01/2020 FINDINGS: Technical note: Despite efforts by the technologist and patient, motion artifact is present on today's exam and could not be eliminated. This reduces exam sensitivity and specificity. Bones/Joint/Cartilage No evidence of acute fracture or dislocation involving the elbow or proximal forearm. No bony destruction. No bone marrow edema. Large elbow joint effusion (series 17, image 17) with synovial enhancement. Moderate degenerative changes of the elbow joint. Ligaments Evaluation degraded by motion artifact. Muscles and Tendons Tendinous structures about the elbow appear grossly intact including the distal triceps and biceps tendons. No appreciable tenosynovial fluid collection within the distal forearm. Mild multi compartment intramuscular edema at the level of the elbow. No appreciable intramuscular fluid collection. Soft tissues No organized fluid collections within the soft tissues. No apparent deep fascial fluid or edema. IMPRESSION: 1. Motion degraded exam. 2. Large elbow joint effusion with synovial enhancement. Findings are nonspecific and may be related to inflammatory or infectious arthropathy.  Arthrocentesis is recommended for further evaluation. 3. Mild multicompartment intramuscular edema at the level of the elbow. Findings can be seen in the setting of myositis. No appreciable intramuscular fluid collection. 4. No evidence of acute osseous abnormality involving the elbow or proximal forearm. Specifically, no evidence to suggest supracondylar fracture of the distal humerus as suggested on recent CT. Electronically Signed   By: Nicholas  Plundo D.O.   On: 07/01/2020 12:47   CT Elbow Right Wo Contrast  Result Date: 07/01/2020   CLINICAL DATA:  Elbow pain. Stress fracture suspected. Negative x-ray. Swelling and warmth to rt elbow after picking up a heavy bucket 06/29/20, plain films done 06/30/20 EXAM: CT OF THE LOWER RIGHT EXTREMITY WITHOUT CONTRAST TECHNIQUE: Multidetector CT imaging of the right lower extremity was performed according to the standard protocol. COMPARISON:  X-ray left elbow 06/30/2020 FINDINGS: Bones/Joint/Cartilage Cortical irregularity of the supracondylar humerus (7:27-29). No definite radial or olecranon fracture. No elbow dislocation. Associated elbow effusion. Ligaments Suboptimally assessed by CT. Muscles and Tendons Unremarkable. Soft tissues Unremarkable. IMPRESSION: Cortical irregularity along the supracondylar humerus suggestive of a nondisplaced fracture. Associated elbow effusion. Electronically Signed   By: Morgane  Naveau M.D.   On: 07/01/2020 03:29   ECHO TEE  Result Date: 06/07/2020    TRANSESOPHOGEAL ECHO REPORT   Patient Name:   Derik L Wessling Date of Exam: 06/07/2020 Medical Rec #:  [id number]    Height:       67.0 in Accession #:    [id number]   Weight:       175.1 lb Date of Birth:  04/10/1940    BSA:          1.911 m Patient Age:    79 years     BP:           134/72 mmHg Patient Gender: M            HR:           62 bpm. Exam Location:  Inpatient Procedure: 3D Echo, TEE-Intraopertive, Cardiac Doppler and Color Doppler Indications:     I35.2 Nonrheumatic aortic  (valve) stenosis with insufficiency  History:         Patient has prior history of Echocardiogram examinations, most                  recent 08/21/2020. CAD, TIA, Aortic Valve Disease,                  Signs/Symptoms:Chest Pain; Risk Factors:Current Smoker and                  Hypertension. Severe aortic stenosis. Cancer.  Sonographer:     Tina West RDCS Referring Phys:  [id number] MICHAEL COOPER Diagnosing Phys: Peter Nishan MD  Sonographer Comments: TAVR procedure. PROCEDURE: After discussion of the risks and benefits of a TEE, an informed consent was obtained from the patient. The patient was intubated. The transesophogeal probe was passed without difficulty through the esophogus of the patient. Imaged were obtained with the patient in a supine position. Sedation performed by different physician. The patient was monitored while under deep sedation. Image quality was excellent. The patient developed Respiratory depression during the procedure. Continuous sedation. IMPRESSIONS  1. Left ventricular ejection fraction, by estimation, is 60 to 65%. The left ventricle has normal function. There is mild left ventricular hypertrophy.  2. Right ventricular systolic function is normal. The right ventricular size is normal.  3. Left atrial size was mildly dilated. No left atrial/left atrial appendage thrombus was detected.  4. The mitral valve is degenerative. Mild mitral valve regurgitation.  5. Tricuspid valve regurgitation is mild to moderate.  6. Pre TAVR: severe calcific AS with tri leaflet valve. mean gradient 41 mmHg peak 76 mmHg with mild tomoderate AR. AVA 1.2 cm2 o.71 /BSA         Post TAVR: well positioned 29 mm Sapien 2 valve Trivial PVL seen at base of anterior mitral leaflet mean gradient 6 peak 12 mmHg AVA 3.1 cm2   1.7 cm2/m2. The aortic valve has been repaired/replaced. Aortic valve regurgitation is mild to moderate. Severe aortic valve stenosis. FINDINGS  Left Ventricle: Left ventricular ejection fraction, by  estimation, is 60 to 65%. The left ventricle has normal function. The left ventricular internal cavity size was small. There is mild left ventricular hypertrophy. Right Ventricle: The right ventricular size is normal. No increase in right ventricular wall thickness. Right ventricular systolic function is normal. Left Atrium: Left atrial size was mildly dilated. No left atrial/left atrial appendage thrombus was detected. Right Atrium: Right atrial size was normal in size. Pericardium: There is no evidence of pericardial effusion. Mitral Valve: The mitral valve is degenerative in appearance. There is mild thickening of the mitral valve leaflet(s). There is mild calcification of the mitral valve leaflet(s). Mild mitral annular calcification. Mild mitral valve regurgitation. Tricuspid Valve: The tricuspid valve is normal in structure. Tricuspid valve regurgitation is mild to moderate. Aortic Valve: Pre TAVR: severe calcific AS with tri leaflet valve. mean gradient 41 mmHg peak 76 mmHg with mild tomoderate AR. AVA 1.2 cm2 o.71 /BSA Post TAVR: well positioned 29 mm Sapien 2 valve Trivial PVL seen at base of anterior mitral leaflet mean gradient 6 peak 12 mmHg AVA 3.1 cm2 1.7 cm2/m2. The aortic valve has been repaired/replaced. Aortic valve regurgitation is mild to moderate. Severe aortic stenosis is present. Aortic valve mean gradient measures 27.0 mmHg. Aortic valve peak gradient measures 43.4 mmHg. Aortic valve area, by VTI measures 1.69 cm. Pulmonic Valve: The pulmonic valve was normal in structure. Pulmonic valve regurgitation is mild. Aorta: The aortic root is normal in size and structure. IAS/Shunts: No atrial level shunt detected by color flow Doppler.  LEFT VENTRICLE PLAX 2D LVOT diam:     2.40 cm LV SV:         131 LV SV Index:   68 LVOT Area:     4.52 cm  AORTIC VALVE AV Area (Vmax):    1.88 cm AV Area (Vmean):   1.76 cm AV Area (VTI):     1.69 cm AV Vmax:           329.33 cm/s AV Vmean:          233.667  cm/s AV VTI:            0.775 m AV Peak Grad:      43.4 mmHg AV Mean Grad:      27.0 mmHg LVOT Vmax:         136.67 cm/s LVOT Vmean:        90.733 cm/s LVOT VTI:          0.289 m LVOT/AV VTI ratio: 0.37  SHUNTS Systemic VTI:  0.29 m Systemic Diam: 2.40 cm Peter Nishan MD Electronically signed by Peter Nishan MD Signature Date/Time: 06/07/2020/10:27:40 AM    Final    ECHOCARDIOGRAM LIMITED  Result Date: 06/08/2020    ECHOCARDIOGRAM REPORT   Patient Name:   Keaden L Dandy Date of Exam: 06/08/2020 Medical Rec #:  [id number]    Height:       67.0 in Accession #:    [id number]   Weight:       174.6 lb Date of Birth:  11/07/1940    BSA:          1.909 m Patient Age:    79 years     BP:           148/61 mmHg Patient Gender: M              HR:           60 bpm. Exam Location:  Inpatient Procedure: 3D Echo, Limited Echo, Cardiac Doppler and Color Doppler Indications:    I35.0 Nonrheumatic aortic (valve) stenosis. Post TAVR.  History:        Patient has prior history of Echocardiogram examinations, most                 recent 06/07/2020. CAD, TIA, Aortic Valve Disease; Risk                 Factors:Hypertension and Dyslipidemia. Severe aortic stenosis.                 S/P TAVR using 29mm Edwards Sapien 2 valve.  Sonographer:    Tina West RDCS Referring Phys: [id number] KATHRYN R THOMPSON IMPRESSIONS  1. Left ventricular ejection fraction, by estimation, is 60 to 65%. The left ventricle has normal function. The left ventricle has no regional wall motion abnormalities. There is severe asymmetric left ventricular hypertrophy of the basal and septal segments. Left ventricular diastolic parameters were normal.  2. Right ventricular systolic function is normal. The right ventricular size is normal.  3. Left atrial size was moderately dilated.  4. The mitral valve is abnormal. Mild mitral valve regurgitation. No evidence of mitral stenosis.  5. Post TAVR with 29 mm Sapien 3 valve no significant PVL mean gradient 8 peak 17 mmHg AVA 5.6 cm2 DVI  0.91 . The aortic valve is normal in structure. Aortic valve regurgitation is not visualized. No aortic stenosis is present.  6. Aortic dilatation noted. There is mild dilatation of the ascending aorta, measuring 41 mm.  7. The inferior vena cava is normal in size with greater than 50% respiratory variability, suggesting right atrial pressure of 3 mmHg. FINDINGS  Left Ventricle: Left ventricular ejection fraction, by estimation, is 60 to 65%. The left ventricle has normal function. The left ventricle has no regional wall motion abnormalities. The left ventricular internal cavity size was normal in size. There is  severe asymmetric left ventricular hypertrophy of the basal and septal segments. Left ventricular diastolic parameters were normal. Right Ventricle: The right ventricular size is normal. No increase in right ventricular wall thickness. Right ventricular systolic function is normal. Left Atrium: Left atrial size was moderately dilated. Right Atrium: Right atrial size was normal in size. Pericardium: There is no evidence of pericardial effusion. Mitral Valve: The mitral valve is abnormal. There is mild thickening of the mitral valve leaflet(s). There is mild calcification of the mitral valve leaflet(s). Mild mitral annular calcification. Mild mitral valve regurgitation. No evidence of mitral valve stenosis. Tricuspid Valve: The tricuspid valve is normal in structure. Tricuspid valve regurgitation is not demonstrated. No evidence of tricuspid stenosis. Aortic Valve: Post TAVR with 29 mm Sapien 3 valve no significant PVL mean gradient 8 peak 17 mmHg AVA 5.6 cm2 DVI 0.91. The aortic valve is normal in structure. Aortic valve regurgitation is not visualized. No aortic stenosis is present. Aortic valve mean gradient measures 8.0 mmHg. Aortic valve peak gradient measures 17.1 mmHg. Aortic valve area, by VTI measures 6.02 cm. There is a 29 mm Sapien prosthetic, stented (TAVR) valve present in the aortic position.  Pulmonic Valve: The pulmonic valve was normal in structure. Pulmonic valve regurgitation is not visualized. No evidence of pulmonic stenosis. Aorta: The aortic root is normal in size and structure and aortic dilatation noted. There is mild dilatation of the ascending aorta, measuring 41 mm. Venous: The inferior vena   cava is normal in size with greater than 50% respiratory variability, suggesting right atrial pressure of 3 mmHg. IAS/Shunts: No atrial level shunt detected by color flow Doppler.  LEFT VENTRICLE PLAX 2D LVIDd:         3.70 cm LVIDs:         2.30 cm LV PW:         2.10 cm LV IVS:        1.80 cm LVOT diam:     2.90 cm LV SV:         278 LV SV Index:   146 LVOT Area:     6.61 cm  LV Volumes (MOD) LV vol d, MOD A2C: 91.6 ml LV vol d, MOD A4C: 105.0 ml LV vol s, MOD A2C: 31.4 ml LV vol s, MOD A4C: 29.2 ml LV SV MOD A2C:     60.2 ml LV SV MOD A4C:     105.0 ml LV SV MOD BP:      70.5 ml IVC IVC diam: 2.00 cm LEFT ATRIUM            Index LA diam:      5.10 cm  2.67 cm/m LA Vol (A4C): 104.0 ml 54.48 ml/m  AORTIC VALVE AV Area (Vmax):    5.62 cm AV Area (Vmean):   6.81 cm AV Area (VTI):     6.02 cm AV Vmax:           207.00 cm/s AV Vmean:          132.000 cm/s AV VTI:            0.462 m AV Peak Grad:      17.1 mmHg AV Mean Grad:      8.0 mmHg LVOT Vmax:         176.00 cm/s LVOT Vmean:        136.000 cm/s LVOT VTI:          0.421 m LVOT/AV VTI ratio: 0.91  AORTA Ao Root diam: 4.10 cm Ao Asc diam:  4.10 cm MITRAL VALVE MV Area (PHT): 3.58 cm     SHUNTS MV Decel Time: 212 msec     Systemic VTI:  0.42 m MV E velocity: 131.00 cm/s  Systemic Diam: 2.90 cm MV A velocity: 87.30 cm/s MV E/A ratio:  1.50 Jenkins Rouge MD Electronically signed by Jenkins Rouge MD Signature Date/Time: 06/08/2020/8:44:56 AM    Final    Structural Heart Procedure  Result Date: 06/07/2020 See surgical note for result.   Recent Labs: Lab Results  Component Value Date   WBC 9.0 07/01/2020   HGB 11.1 (L) 07/01/2020   PLT 139 (L)  07/01/2020   NA 138 07/01/2020   K 3.5 07/01/2020   CL 101 07/01/2020   CO2 29 07/01/2020   GLUCOSE 126 (H) 07/01/2020   BUN 16 07/01/2020   CREATININE 0.54 (L) 07/01/2020   BILITOT 0.7 07/01/2020   ALKPHOS 140 (H) 07/01/2020   AST 39 07/01/2020   ALT 35 07/01/2020   PROT 6.9 07/01/2020   ALBUMIN 3.7 07/01/2020   CALCIUM 9.1 07/01/2020   GFRAA 109 01/11/2020   QFTBGOLDPLUS NEGATIVE 01/11/2020    Speciality Comments: No specialty comments available.  Procedures:  No procedures performed Allergies: Celebrex [celecoxib]   Assessment / Plan:     Visit Diagnoses: Polymyalgia rheumatica ([place]) - History of muscle weakness and tenderness, elevated sed rate. Prednisone responsive: He has not had any signs or symptoms of a PMR flare recently.  He has clinically been doing well taking prednisone 7 mg daily.  He developed some of recurrence of his symptoms after trying to reduce prednisone to 6 mg daily in March 2022, but his symptoms improved after increasing the dose back to 7 mg daily.  He is not exhibiting any signs of flare at this time.  No muscle weakness or muscle tenderness noted on exam.  He has no difficulty rising from a seated position or raising his arms above his head.  His sed rate was 20 on 07/01/20.  He has been apprehensive to start on Methotrexate due to potential side effects and having to undergo several cardiac procedures over the past several months.  Discussed that the goal is to eventually discontinue prednisone due to the risks of side effects with long term use.  He will continue to try to taper prednisone by 1 mg every 2 months.  He was advised to notify us if he develops any new or worsening symptoms.  He may still require methotrexate as a steroid sparing agent in the future.  He will follow up in 5 months.    High risk medication use - Prednisone 7 mg by mouth daily. Taper by 1 mg every 2 months. Discussed the risks of long-term prednisone use. Future order for DEXA  placed today.   Long term (current) use of systemic steroids -Patient is aware of the long term risks of prednisone use.   Primary osteoarthritis of both hands - He has positive ANA but no clinical features of autoimmune disease.  ENA panel was negative.   He has PIP and DIP thickening consistent with osteoarthritis of both hands.  No clinical features of rheumatoid arthritis.  No joint tenderness or synovitis noted. Discussed the importance of joint protection and muscle strengthening.   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  No injury prior to the onset of symptoms.  He was evaluated in the ED on 06/30/20 and had a MRI of the right forearm which revealed a large joint effusion with synovial enhancement.  He underwent an arthrocentesis performed by Dr. Marlou Sa: synovial fluid analysis revealed extracellular monosodium urate crystals and intracellular calcium pyrophosphate crystals.  Uric acid was 3.9, ESR 20, and CRP 3.5 on 07/01/20.  Discussed that it is typical to have a falsely low serum uric acid level during a gout flare.  He was given a prescription for prednisone, so he took 20 mg for 2 days and then reduced to 10 mg for 1 day and has returned to 7 mg daily. Discussed that he may benefit from colchicine if his symptoms recur.  This was his first gout flare so he does not require a urate lowering medication at this time. Discussed that colchicine can interact with statin therapy as well as carvedilol which he is taking daily, so he would need to take a low dose of colchicine on a PRN basis only.  Discussed the importance of avoiding a purine rich diet.  All questions were addressed. He plans on scheduling an appointment with Dr. Marlou Sa this week to first discuss recommendations.   Psoriasis - Patient developed psoriasis 15 years ago which lasted for 1 year without any recurrence.  No active  psoriasis at this time.  No clinical features of psoriatic arthritis.   DDD (degenerative disc disease), lumbar - Status post fusion by Dr. Sherwood Gambler. Chronic pain.  Followed by pain management.  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 Dysphagia has improved.   Chronic pain syndrome - Followed by pain management.  He takes roxicodone as needed for pain relief.   History of atherectomy: Performed by Dr. Burt Knack.  He underwent a successful orbital atherectomy and stenting of severe calcific stenosis in the proximal LAD.    History of aortic valve replacement: Preformed by Dr. Cyndia Bent and Dr. Danise Edge TAVR on 06/07/20.  He was advised to continue DAPT with aspirin and plavix.  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 Donzetta Matters. Pre TAVR CT showed large infrarenal abdominal aortic aneurysm which is increasing in size.   Other medical conditions are listed as follows:   Liver abscess - In 2018 due to diverticulitis.  Treated with IV antibiotics over 3 weeks.  History of diverticulitis - Followed by Dr. Fuller Plan.  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  Anxiety and depression  History of hyperlipidemia  Tobacco abuse disorder - one pack per day for 50 years.  Orders: Orders Placed This Encounter  Procedures  . DG BONE DENSITY (DXA)   No orders of the defined types were placed in this encounter.    Follow-Up Instructions: Return in about 5 months (around 12/05/2020) for Polymyalgia Rheumatica, DDD, Osteoarthritis.   Ofilia Neas, PA-C  Note - This record has been created using Dragon software.  Chart creation errors have been sought, but may not always  have been located. Such creation errors do not reflect on  the standard of medical care.

## 2020-06-28 ENCOUNTER — Telehealth: Payer: Self-pay | Admitting: Adult Health

## 2020-06-28 NOTE — Telephone Encounter (Signed)
R/s per H&n , pt aware

## 2020-06-30 ENCOUNTER — Emergency Department (HOSPITAL_COMMUNITY)
Admission: EM | Admit: 2020-06-30 | Discharge: 2020-07-01 | Disposition: A | Discharge status: Home / Self Care | Payer: Medicare Other | Attending: Emergency Medicine | Admitting: Emergency Medicine

## 2020-06-30 ENCOUNTER — Encounter (HOSPITAL_COMMUNITY): Payer: Self-pay | Admitting: Emergency Medicine

## 2020-06-30 ENCOUNTER — Emergency Department (HOSPITAL_COMMUNITY): Payer: Medicare Other

## 2020-06-30 DIAGNOSIS — M109 Gout, unspecified: Secondary | ICD-10-CM

## 2020-06-30 DIAGNOSIS — Z8546 Personal history of malignant neoplasm of prostate: Secondary | ICD-10-CM | POA: Insufficient documentation

## 2020-06-30 DIAGNOSIS — R600 Localized edema: Secondary | ICD-10-CM | POA: Diagnosis not present

## 2020-06-30 DIAGNOSIS — I10 Essential (primary) hypertension: Secondary | ICD-10-CM | POA: Diagnosis not present

## 2020-06-30 DIAGNOSIS — Z7982 Long term (current) use of aspirin: Secondary | ICD-10-CM | POA: Insufficient documentation

## 2020-06-30 DIAGNOSIS — M25421 Effusion, right elbow: Secondary | ICD-10-CM | POA: Diagnosis not present

## 2020-06-30 DIAGNOSIS — M10021 Idiopathic gout, right elbow: Secondary | ICD-10-CM | POA: Diagnosis not present

## 2020-06-30 DIAGNOSIS — M25521 Pain in right elbow: Secondary | ICD-10-CM | POA: Diagnosis not present

## 2020-06-30 DIAGNOSIS — Z85818 Personal history of malignant neoplasm of other sites of lip, oral cavity, and pharynx: Secondary | ICD-10-CM | POA: Insufficient documentation

## 2020-06-30 DIAGNOSIS — Z79899 Other long term (current) drug therapy: Secondary | ICD-10-CM | POA: Insufficient documentation

## 2020-06-30 DIAGNOSIS — Z87891 Personal history of nicotine dependence: Secondary | ICD-10-CM | POA: Insufficient documentation

## 2020-06-30 DIAGNOSIS — Z955 Presence of coronary angioplasty implant and graft: Secondary | ICD-10-CM | POA: Insufficient documentation

## 2020-06-30 DIAGNOSIS — M19021 Primary osteoarthritis, right elbow: Secondary | ICD-10-CM | POA: Diagnosis not present

## 2020-06-30 DIAGNOSIS — X500XXA Overexertion from strenuous movement or load, initial encounter: Secondary | ICD-10-CM | POA: Insufficient documentation

## 2020-06-30 DIAGNOSIS — Z930 Tracheostomy status: Secondary | ICD-10-CM | POA: Diagnosis not present

## 2020-06-30 DIAGNOSIS — I2510 Atherosclerotic heart disease of native coronary artery without angina pectoris: Secondary | ICD-10-CM | POA: Diagnosis not present

## 2020-06-30 DIAGNOSIS — M79631 Pain in right forearm: Secondary | ICD-10-CM | POA: Diagnosis not present

## 2020-06-30 NOTE — ED Notes (Signed)
Patient's wife and legal guardian, [name] [phone], made aware patient is in ED to be seen.

## 2020-06-30 NOTE — ED Triage Notes (Addendum)
Per EMS, patient from home, swelling and warmth to right elbow since yesterday after picking up heavy bucket. States pain started this morning. A&Ox4. States took home oxycodone x2 hours ago without relief.

## 2020-07-01 ENCOUNTER — Emergency Department (HOSPITAL_COMMUNITY): Payer: Medicare Other

## 2020-07-01 ENCOUNTER — Other Ambulatory Visit (HOSPITAL_COMMUNITY): Payer: Self-pay | Admitting: Cardiology

## 2020-07-01 DIAGNOSIS — M19021 Primary osteoarthritis, right elbow: Secondary | ICD-10-CM | POA: Diagnosis not present

## 2020-07-01 DIAGNOSIS — R600 Localized edema: Secondary | ICD-10-CM | POA: Diagnosis not present

## 2020-07-01 DIAGNOSIS — M25521 Pain in right elbow: Secondary | ICD-10-CM | POA: Diagnosis not present

## 2020-07-01 DIAGNOSIS — M25421 Effusion, right elbow: Secondary | ICD-10-CM | POA: Diagnosis not present

## 2020-07-01 DIAGNOSIS — M79631 Pain in right forearm: Secondary | ICD-10-CM | POA: Diagnosis not present

## 2020-07-01 LAB — SEDIMENTATION RATE: Sed Rate: 20 mm/hr — ABNORMAL HIGH (ref 0–16)

## 2020-07-01 LAB — CBC WITH DIFFERENTIAL/PLATELET
Abs Immature Granulocytes: 0.04 10*3/uL (ref 0.00–0.07)
Basophils Absolute: 0 10*3/uL (ref 0.0–0.1)
Basophils Relative: 0 %
Eosinophils Absolute: 0.1 10*3/uL (ref 0.0–0.5)
Eosinophils Relative: 1 %
HCT: 35.8 % — ABNORMAL LOW (ref 39.0–52.0)
Hemoglobin: 11.1 g/dL — ABNORMAL LOW (ref 13.0–17.0)
Immature Granulocytes: 0 %
Lymphocytes Relative: 7 %
Lymphs Abs: 0.6 10*3/uL — ABNORMAL LOW (ref 0.7–4.0)
MCH: 25.5 pg — ABNORMAL LOW (ref 26.0–34.0)
MCHC: 31 g/dL (ref 30.0–36.0)
MCV: 82.3 fL (ref 80.0–100.0)
Monocytes Absolute: 0.7 10*3/uL (ref 0.1–1.0)
Monocytes Relative: 8 %
Neutro Abs: 7.5 10*3/uL (ref 1.7–7.7)
Neutrophils Relative %: 84 %
Platelets: 139 10*3/uL — ABNORMAL LOW (ref 150–400)
RBC: 4.35 MIL/uL (ref 4.22–5.81)
RDW: 14.6 % (ref 11.5–15.5)
WBC: 9 10*3/uL (ref 4.0–10.5)
nRBC: 0 % (ref 0.0–0.2)

## 2020-07-01 LAB — URIC ACID: Uric Acid, Serum: 3.9 mg/dL (ref 3.7–8.6)

## 2020-07-01 LAB — COMPREHENSIVE METABOLIC PANEL
ALT: 35 U/L (ref 0–44)
AST: 39 U/L (ref 15–41)
Albumin: 3.7 g/dL (ref 3.5–5.0)
Alkaline Phosphatase: 140 U/L — ABNORMAL HIGH (ref 38–126)
Anion gap: 8 (ref 5–15)
BUN: 16 mg/dL (ref 8–23)
CO2: 29 mmol/L (ref 22–32)
Calcium: 9.1 mg/dL (ref 8.9–10.3)
Chloride: 101 mmol/L (ref 98–111)
Creatinine, Ser: 0.54 mg/dL — ABNORMAL LOW (ref 0.61–1.24)
GFR, Estimated: >60 mL/min (ref >60)
Glucose, Bld: 126 mg/dL — ABNORMAL HIGH (ref 70–99)
Potassium: 3.5 mmol/L (ref 3.5–5.1)
Sodium: 138 mmol/L (ref 135–145)
Total Bilirubin: 0.7 mg/dL (ref 0.3–1.2)
Total Protein: 6.9 g/dL (ref 6.5–8.1)

## 2020-07-01 LAB — PROTIME-INR
INR: 1 (ref 0.8–1.2)
Prothrombin Time: 13.7 seconds (ref 11.4–15.2)

## 2020-07-01 LAB — C-REACTIVE PROTEIN: CRP: 3.5 mg/dL — ABNORMAL HIGH (ref <1.0)

## 2020-07-01 MED ORDER — GADOBUTROL 1 MMOL/ML IV SOLN
7.5000 mL | Freq: Once | INTRAVENOUS | Status: AC | PRN
  Administered 2020-07-01: 7.5 mL via INTRAVENOUS

## 2020-07-01 MED ORDER — LIDOCAINE HCL 2 % IJ SOLN: 20.0000 mL | Freq: Once | INTRAMUSCULAR | Status: AC

## 2020-07-01 MED ORDER — FENTANYL CITRATE (PF) 100 MCG/2ML IJ SOLN
50.0000 ug | Freq: Once | INTRAMUSCULAR | Status: AC
  Administered 2020-07-01: 50 ug via INTRAVENOUS
  Filled 2020-07-01: qty 2

## 2020-07-01 MED ORDER — HYDROMORPHONE HCL 1 MG/ML IJ SOLN
1.0000 mg | Freq: Once | INTRAMUSCULAR | Status: AC
  Administered 2020-07-01: 1 mg via INTRAVENOUS
  Filled 2020-07-01: qty 1

## 2020-07-01 MED ORDER — POVIDONE-IODINE 10 % EX SOLN
Freq: Once | CUTANEOUS | Status: DC
  Filled 2020-07-01: qty 118

## 2020-07-01 MED ORDER — LIDOCAINE HCL 2 % IJ SOLN
INTRAMUSCULAR | Status: AC
  Administered 2020-07-01: 400 mg
  Filled 2020-07-01: qty 20

## 2020-07-01 MED ORDER — HYDROMORPHONE HCL 1 MG/ML IJ SOLN
1.0000 mg | Freq: Once | INTRAMUSCULAR | Status: AC
Start: 2020-07-01 — End: 2020-07-01
  Administered 2020-07-01: 1 mg via INTRAVENOUS
  Filled 2020-07-01: qty 1

## 2020-07-01 MED ORDER — PREDNISONE 20 MG PO TABS: 60.0000 mg | ORAL_TABLET | Freq: Once | ORAL | Status: DC

## 2020-07-01 MED ORDER — HYDROMORPHONE HCL 1 MG/ML IJ SOLN
1.0000 mg | INTRAMUSCULAR | Status: DC | PRN
  Administered 2020-07-01 (×2): 1 mg via INTRAVENOUS
  Filled 2020-07-01 (×2): qty 1

## 2020-07-01 MED ORDER — PREDNISONE 20 MG PO TABS: 40.0000 mg | ORAL_TABLET | Freq: Every day | ORAL | 0 refills | Status: DC

## 2020-07-01 MED ORDER — VANCOMYCIN HCL 1500 MG/300ML IV SOLN
1500.0000 mg | Freq: Once | INTRAVENOUS | Status: AC
  Administered 2020-07-01: 1500 mg via INTRAVENOUS
  Filled 2020-07-01: qty 300

## 2020-07-01 MED ORDER — OXYCODONE-ACETAMINOPHEN 10-325 MG PO TABS: 1.0000 | ORAL_TABLET | ORAL | 0 refills | Status: AC | PRN

## 2020-07-01 MED ORDER — LIDOCAINE HCL (PF) 1 % IJ SOLN
30.0000 mL | Freq: Once | INTRAMUSCULAR | Status: DC
  Filled 2020-07-01: qty 30

## 2020-07-01 MED ORDER — SODIUM CHLORIDE 0.9 % IV SOLN
1.0000 g | Freq: Once | INTRAVENOUS | Status: AC
  Administered 2020-07-01: 1 g via INTRAVENOUS
  Filled 2020-07-01 (×2): qty 10

## 2020-07-01 MED ORDER — OXYCODONE-ACETAMINOPHEN 5-325 MG PO TABS
2.0000 | ORAL_TABLET | Freq: Once | ORAL | Status: AC
  Administered 2020-07-01: 2 via ORAL
  Filled 2020-07-01: qty 2

## 2020-07-01 NOTE — ED Provider Notes (Signed)
[place] DEPT Provider Note   CSN: [id number] Arrival date & time: 06/30/20  2113     History Chief Complaint  Patient presents with  . Elbow Pain    Brandon [name] is a 80 y.o. male.  80 y.o. 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  Patient here with right elbow pain that onset 2 days ago.  He is having difficulty moving the arm at all and feels like it is held in a flexed position.  States this started after he lifted a heavy bucket and try to pour into something else.  Having severe pain and unable to range the elbow at all.  Denies having a fever at home.  Denies any other joint pain.  Denies having this problem before.  No history of gout. He is on aspirin and Plavix but no other anticoagulation.  The history is provided by the patient.       Past Medical History:  Diagnosis Date  . Abdominal aneurysm ([place])   . Arthritis    "all over" (07/19/2016)  . BPH (benign prostatic hypertrophy)   . CAD (coronary artery disease)   . Chicken pox   . Chronic lower back pain    s/p surgical fusion  . Depression   . Diverticulosis   . Esophageal stricture   . GERD (gastroesophageal reflux disease)   . Hepatitis B 1984  . Hiatal hernia   . History of radiation therapy 01/16/18- 03/05/18   Left Tonsil, 66 Gy in 33 fractions to high risk nodal echelons.   Marland Kitchen HLD (hyperlipidemia)   . HTN (hypertension)   . 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 Osteoarthritis   . 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   s/p TAVR with a 29 mm Edwards Sapien 3  via the subclavian approach by Dr. Burt Knack and Dr Cyndia Bent   . Severe aortic stenosis   . TIA (transient ischemic attack) 1990s   hx  . tonsillar ca dx'd 11/2017  . Tubular adenoma of colon 2009    Patient Active Problem List   Diagnosis Date Noted  . 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 TIA (transient ischemic attack)   . Esophageal stricture   . 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  . Polymyalgia rheumatica ([place]) 04/19/2020  . Primary osteoarthritis of both hands 01/11/2020  . Piriformis syndrome of both sides 09/23/2019  . Pain of both shoulder joints 07/29/2019  . Sciatica associated with disorder of lumbar spine 04/24/2019  . Primary osteoarthritis involving multiple joints 04/24/2019  . Chronic pain syndrome 04/24/2019  . Anxiety and depression 04/15/2018  . 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 Cancer of tonsillar fossa ([place]) 09/20/2017  . 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 Aneurysm of infrarenal abdominal aorta (HCC) 07/10/2016  . Normocytic anemia 07/10/2016  . Essential hypertension 05/19/2013  . Osteoarthritis 05/29/2009  . Psoriasis 01/13/2009  . Coronary artery disease 09/15/2008  . Severe aortic stenosis 01/28/2008  . Diverticulosis of colon 08/27/2007  . History of benign prostatic hyperplasia 08/27/2007  . Hyperlipidemia 03/19/2007  . Gastroesophageal reflux disease 03/19/2007    Past Surgical History:  Procedure Laterality Date  .  BACK SURGERY    . CARDIAC CATHETERIZATION    . CATARACT EXTRACTION W/ INTRAOCULAR LENS  IMPLANT, BILATERAL Bilateral 01/2012 - 02/2012  . CORONARY ATHERECTOMY N/A 04/27/2020   Procedure: CORONARY ATHERECTOMY;  Surgeon: Sherren Mocha, MD;  Location: [place] [facility];  Service: Cardiovascular;  Laterality: N/A;  . CORONARY STENT INTERVENTION N/A 04/27/2020   Procedure: CORONARY STENT INTERVENTION;  Surgeon: Sherren Mocha, MD;  Location: [place] [facility];  Service: Cardiovascular;  Laterality: N/A;  . DIRECT  LARYNGOSCOPY Left 10/09/2017   Procedure: DIRECT LARYNGOSCOPY WITH BOPSY;  Surgeon: Jodi Marble, MD;  Location: [place];  Service: ENT;  Laterality: Left;  . ESOPHAGOGASTRODUODENOSCOPY (EGD) WITH ESOPHAGEAL DILATION     "couple times" (07/19/2016)  . ESOPHAGOSCOPY Left 10/09/2017   Procedure: ESOPHAGOSCOPY;  Surgeon: Jodi Marble, MD;  Location: [place];  Service: ENT;  Laterality: Left;  . INTRAVASCULAR IMAGING/OCT N/A 04/27/2020   Procedure: INTRAVASCULAR IMAGING/OCT;  Surgeon: Sherren Mocha, MD;  Location: [place] [facility];  Service: Cardiovascular;  Laterality: N/A;  . INTRAVASCULAR PRESSURE WIRE/FFR STUDY N/A 04/27/2020   Procedure: INTRAVASCULAR PRESSURE WIRE/FFR STUDY;  Surgeon: Sherren Mocha, MD;  Location: [place] [facility];  Service: Cardiovascular;  Laterality: N/A;  . IR GASTROSTOMY TUBE MOD SED  01/08/2018  . IR THORACENTESIS ASP PLEURAL SPACE W/IMG GUIDE  07/19/2016  . LAPAROSCOPIC CHOLECYSTECTOMY  1994  . LUMBAR DISC SURGERY  05/1996   L4-5; Dr. Sherwood Gambler   . LUMBAR LAMINECTOMY/DECOMPRESSION MICRODISCECTOMY  10/2002   L3-4. Dr. Sherwood Gambler  . MULTIPLE TOOTH EXTRACTIONS  1980s  . PARTIAL GLOSSECTOMY  12/02/2017   Dr. Nicolette Bang- [facility]  . pharyngoplasty for closure of tingue base defect  12/02/2017   Dr. Nicolette Bang- [facility]  . PICC LINE INSERTION  07/15/2016  . POSTERIOR LUMBAR FUSION  09/1996   Ray cage, L4-5 Dr. Rita Ohara  . PROSTATE BIOPSY  ~ 2017  . radical tonsillectomy Left 12/02/2017   Dr[facility]  . RIGHT HEART CATH AND CORONARY ANGIOGRAPHY N/A 03/31/2020   Procedure: RIGHT HEART CATH AND CORONARY ANGIOGRAPHY;  Surgeon: Larey Dresser, MD;  Location: [place] [facility];  Service: Cardiovascular;  Laterality: N/A;  . RIGID BRONCHOSCOPY Left 10/09/2017   Procedure: RIGID BRONCHOSCOPY;  Surgeon: Jodi Marble, MD;  Location: [place];  Service: ENT;  Laterality: Left;  . TEE WITHOUT CARDIOVERSION N/A 06/07/2020    Procedure: TRANSESOPHAGEAL ECHOCARDIOGRAM (TEE);  Surgeon: Sherren Mocha, MD;  Location: [place];  Service: Open Heart Surgery;  Laterality: N/A;  . TONSILLECTOMY    . TRACHEOSTOMY  12/02/2017   Dr. Nicolette Bang- [facility]  . ULTRASOUND GUIDANCE FOR VASCULAR ACCESS Right 06/07/2020   Procedure: ULTRASOUND GUIDANCE FOR VASCULAR ACCESS;  Surgeon: Sherren Mocha, MD;  Location: [place];  Service: Open Heart Surgery;  Laterality: Right;  Marland Kitchen VASCULAR SURGERY         Family History  Problem Relation Age of Onset  . Heart disease Father 51       Living  . Coronary artery disease Father        CABG  . Alzheimer's disease Mother 47       Deceased  . Arthritis Mother   . Aneurysm Brother   . Stomach cancer Maternal Uncle   . Brain cancer Maternal Aunt        x2  . Obesity Daughter        Had Bypass Sx    Social History   Tobacco Use  . Smoking  status: Former Smoker    Packs/day: 1.50    Years: 50.00    Pack years: 75.00    Types: Cigarettes    Quit date: 09/05/2017    Years since quitting: 2.8  . Smokeless tobacco: Never Used  . Tobacco comment: smoked less than 1 ppd for 40+ years; Using Vapor Cig  Vaping Use  . Vaping Use: Never used  Substance Use Topics  . Alcohol use: Yes    Alcohol/week: 0.0 standard drinks    Comment: Occasionally,not a heavy drinker   . Drug use: Yes    Types: Oxycodone    Home Medications Prior to Admission medications   Medication Sig Start Date End Date Taking? Authorizing Provider  amLODipine (NORVASC) 10 MG tablet Take 1 tablet (10 mg total) by mouth daily. 07/15/19   Larey Dresser, MD  aspirin 81 MG EC tablet Take 81 mg by mouth at bedtime.    [provider]  atorvastatin (LIPITOR) 80 MG tablet Take 1 tablet (80 mg total) by mouth daily. 04/01/20   Larey Dresser, MD  carvedilol (COREG) 6.25 MG tablet Take 1 tablet (6.25 mg total) by mouth 2 (two) times daily. 03/24/20 03/24/21  Larey Dresser, MD  citalopram (CELEXA) 20 MG tablet Take 1  tablet (20 mg total) by mouth daily. 04/25/20   Lovorn, Jinny Blossom, MD  clopidogrel (PLAVIX) 75 MG tablet Take 1 tablet (75 mg total) by mouth daily. 04/20/20   Sherren Mocha, MD  ezetimibe (ZETIA) 10 MG tablet Take 1 tablet (10 mg total) by mouth daily. 05/23/20 05/23/21  Larey Dresser, MD  fluticasone Asencion Islam) 50 MCG/ACT nasal spray Place 2 sprays into both nostrils daily as needed for rhinitis or allergies.    [provider]  gabapentin (NEURONTIN) 300 MG capsule TAKE 1 CAPSULE(300 MG) BY MOUTH THREE TIMES DAILY 02/08/20   Lovorn, Jinny Blossom, MD  isosorbide mononitrate (IMDUR) 30 MG 24 hr tablet TAKE 1 TABLET(30 MG) BY MOUTH AT BEDTIME 01/18/20   Brunetta Jeans, PA-C  lisinopril (ZESTRIL) 10 MG tablet TAKE 1 TABLET(10 MG) BY MOUTH DAILY 09/02/19   Larey Dresser, MD  nicotine (NICODERM CQ - DOSED IN MG/24 HOURS) 21 mg/24hr patch Place 21 mg onto the skin daily.    [provider]  oxyCODONE (OXY IR/ROXICODONE) 5 MG immediate release tablet Take 1-2 tablets (5-10 mg total) by mouth every 6 (six) hours as needed for severe pain (to take 2 tabs ONLY when severe pain- which is not usual for pt). 06/16/20   Eileen Stanford, PA-C  pantoprazole (PROTONIX) 40 MG tablet Take 1 tablet (40 mg total) by mouth daily. 04/20/20 04/15/21  Sherren Mocha, MD  predniSONE (DELTASONE) 1 MG tablet Take 2 tablets p.o. every morning 05/24/20   Bo Merino, MD  predniSONE (DELTASONE) 5 MG tablet TAKE 1 TABLET(5 MG) BY MOUTH DAILY WITH BREAKFAST 06/23/20   Bo Merino, MD  protein supplement shake (PREMIER PROTEIN) LIQD Take 11 oz by mouth in the morning.    [provider]  tamsulosin (FLOMAX) 0.4 MG CAPS capsule Take 0.4 mg by mouth at bedtime. 12/23/18   [provider]  traMADol (ULTRAM) 50 MG tablet Take 2 tablets (100 mg total) by mouth 2 (two) times daily. 03/16/20   Lovorn, Jinny Blossom, MD  trolamine salicylate (ASPERCREME) 10 % cream Apply 1 application topically as needed for  muscle pain.    [provider]    Allergies    Celebrex [celecoxib]  Review of Systems  Review of Systems  Constitutional: Negative for activity change, appetite change and fever.  HENT: Negative for congestion.   Respiratory: Negative for cough, chest tightness and shortness of breath.   Cardiovascular: Negative for chest pain.  Gastrointestinal: Negative for abdominal pain, nausea and vomiting.  Genitourinary: Negative for dysuria and hematuria.  Musculoskeletal: Positive for arthralgias, joint swelling and myalgias.  Skin: Positive for wound.  Neurological: Negative for dizziness, weakness and headaches.   all other systems are negative except as noted in the HPI and PMH.    Physical Exam Updated Vital Signs BP (!) 151/80   Pulse 78   Temp 98.6 F (37 C) (Oral)   Resp 18   SpO2 99%   Physical Exam Vitals and nursing note reviewed.  Constitutional:      General: He is not in acute distress.    Appearance: He is well-developed.  HENT:     Head: Normocephalic and atraumatic.     Mouth/Throat:     Pharynx: No oropharyngeal exudate.  Eyes:     Conjunctiva/sclera: Conjunctivae normal.     Pupils: Pupils are equal, round, and reactive to light.  Neck:     Comments: No meningismus. Cardiovascular:     Rate and Rhythm: Normal rate and regular rhythm.     Heart sounds: Normal heart sounds. No murmur heard.   Pulmonary:     Effort: Pulmonary effort is normal. No respiratory distress.     Breath sounds: Normal breath sounds.  Abdominal:     Palpations: Abdomen is soft.     Tenderness: There is no abdominal tenderness. There is no guarding or rebound.  Musculoskeletal:        General: Swelling, tenderness and signs of injury present. Normal range of motion.     Cervical back: Normal range of motion and neck supple.     Comments: Right elbow is held in flexed position.  It is tender and warm to palpation.  Resist movement in flexion and extension. Intact  radial pulse  Skin:    General: Skin is warm.  Neurological:     Mental Status: He is alert and oriented to person, place, and time.     Cranial Nerves: No cranial nerve deficit.     Motor: No abnormal muscle tone.     Coordination: Coordination normal.     Comments:  5/5 strength throughout. CN 2-12 intact.Equal grip strength.   Psychiatric:        Behavior: Behavior normal.     ED Results / Procedures / Treatments   Labs (all labs ordered are listed, but only abnormal results are displayed) Labs Reviewed  CBC WITH DIFFERENTIAL/PLATELET - Abnormal; Notable for the following components:      Result Value   Hemoglobin 11.1 (*)    HCT 35.8 (*)    MCH 25.5 (*)    Platelets 139 (*)    Lymphs Abs 0.6 (*)    All other components within normal limits  COMPREHENSIVE METABOLIC PANEL - Abnormal; Notable for the following components:   Glucose, Bld 126 (*)    Creatinine, Ser 0.54 (*)    Alkaline Phosphatase 140 (*)    All other components within normal limits  SEDIMENTATION RATE - Abnormal; Notable for the following components:   Sed Rate 20 (*)    All other components within normal limits  C-REACTIVE PROTEIN - Abnormal; Notable for the following components:   CRP 3.5 (*)    All other components within normal limits  CULTURE, BLOOD (  ROUTINE X 2)  CULTURE, BLOOD (ROUTINE X 2)  URIC ACID  PROTIME-INR    EKG None  Radiology DG Elbow Complete Right  Result Date: 06/30/2020 CLINICAL DATA:  Elbow pain EXAM: RIGHT ELBOW - COMPLETE 3+ VIEW COMPARISON:  None. FINDINGS: Positive for elbow effusion. No definitive fracture or malalignment is seen. Moderate degenerative changes anteriorly and at the proximal radioulnar articulation. Probable soft tissue calcifications adjacent to the lateral epicondyle. IMPRESSION: No definite acute osseous abnormality, however elbow effusion is present. There are moderate degenerative changes. Electronically Signed   By: Donavan Foil M.D.   On: 06/30/2020  21:57   CT Elbow Right Wo Contrast  Result Date: 07/01/2020 CLINICAL DATA:  Elbow pain. Stress fracture suspected. Negative x-ray. Swelling and warmth to rt elbow after picking up a heavy bucket 06/29/20, plain films done 06/30/20 EXAM: CT OF THE LOWER RIGHT EXTREMITY WITHOUT CONTRAST TECHNIQUE: Multidetector CT imaging of the right lower extremity was performed according to the standard protocol. COMPARISON:  X-ray left elbow 06/30/2020 FINDINGS: Bones/Joint/Cartilage Cortical irregularity of the supracondylar humerus (7:27-29). No definite radial or olecranon fracture. No elbow dislocation. Associated elbow effusion. Ligaments Suboptimally assessed by CT. Muscles and Tendons Unremarkable. Soft tissues Unremarkable. IMPRESSION: Cortical irregularity along the supracondylar humerus suggestive of a nondisplaced fracture. Associated elbow effusion. Electronically Signed   By: Iven Finn M.D.   On: 07/01/2020 03:29    Procedures Procedures   Medications Ordered in ED Medications  lidocaine (PF) (XYLOCAINE) 1 % injection 30 mL (has no administration in time range)  povidone-iodine (BETADINE) 10 % external solution (has no administration in time range)  fentaNYL (SUBLIMAZE) injection 50 mcg (has no administration in time range)    ED Course  I have reviewed the triage vital signs and the nursing notes.  Pertinent labs & imaging results that were available during my care of the patient were reviewed by me and considered in my medical decision making (see chart for details).    MDM Rules/Calculators/A&P                         Right elbow pain with swelling and warmth.  He is afebrile.  X-ray shows no fracture but does show joint effusion.  Septic joint is considered.  However patient has no fever and no leukocytosis.  Minimal elevation of CRP at 3.5.  CT scan shows cortical irregularity of supracondylar humerus concerning for nondisplaced fracture.  Given patient's recent valve  replacement as well as his anticoagulation use hesitate to perform arthrocentesis.  No fever.  No leukocytosis.  ESR and CRP minimally elevated. CT scan will be obtained to rule out occult fracture.  Paging system malfunction with delay in response from Dr. Marlou Sa of orthopedics.  He will evaluate patient this morning and requests holding arthrocentesis at this point till he can evaluate the patient.  He is considering gout is a possibility. Patient placed in long-arm splint.  He was given 1 dose of antibiotics for possible component of septic arthritis.  He is a poor candidate for arthrocentesis given his new TAVR, chronic immunosuppression and anticoagulation. Awaiting orthopedic evaluation at shift change. Dr. Almyra Free to assume care.       Final Clinical Impression(s) / ED Diagnoses Final diagnoses:  None    Rx / DC Orders ED Discharge Orders    None       , Annie Main, MD 07/01/20 [id number]

## 2020-07-01 NOTE — ED Provider Notes (Signed)
Patient was signed out to me is pending orthopedic evaluation.  Orthopedic surgery has seen the patient and ordered additional MRI imaging and had considered arthrocentesis.  However due to the patient's comorbidities and condition this was decided against.  Orthopedic surgery ultimately advises that the patient follow-up on an outpatient basis in the next 2 to 3 days.  Advising immediate return for fevers worsening symptoms or any additional concerns.   Luna Fuse, MD 07/01/20 [phone]

## 2020-07-01 NOTE — Progress Notes (Signed)
A consult was received from an ED physician for Vancomycin per pharmacy dosing.  The patient's profile has been reviewed for ht/wt/allergies/indication/available labs.   A one time order has been placed for Vancomycin 1500mg  IV.  Further antibiotics/pharmacy consults should be ordered by admitting physician if indicated.                       Thank you, Netta Cedars PharmD 07/01/2020  5:46 AM

## 2020-07-01 NOTE — Discharge Instructions (Addendum)
Call your primary care doctor or specialist as discussed in the next 2-3 days.   Return immediately back to the ER if:  Your symptoms worsen within the next 12-24 hours. You develop new symptoms such as new fevers, persistent vomiting, new pain, shortness of breath, or new weakness or numbness, or if you have any other concerns.  

## 2020-07-01 NOTE — ED Notes (Signed)
Patient transported to MRI 

## 2020-07-01 NOTE — Progress Notes (Signed)
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 CT scan shows cortical irregularity in the supracondylar region Plan at this time is MRI scan to evaluate soft tissue injury particularly distal biceps injury which would be more consistent with his mechanism of injury.  If significant elbow effusion is present joint aspiration may be required.  Patient has no history of gout or pseudogout.  If this cortical irregularity on the CT scan does represent fracture then it is nondisplaced and then operative intervention will be required.  His mechanism of injury is more consistent with soft tissue injury as opposed to fracture.  Discussed with Dr. Wyvonnia Dusky.

## 2020-07-01 NOTE — Progress Notes (Signed)
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

## 2020-07-01 NOTE — ED Notes (Addendum)
MD Dean at bedside to perform aspiration of right elbow.

## 2020-07-01 NOTE — Progress Notes (Signed)
Orthopedic Tech Progress Note Patient Details:  Brandon [name] 02-Mar-1940 [id number] Added heavy padding  Ortho Devices Type of Ortho Device: Long arm splint Ortho Device/Splint Location: RUE Ortho Device/Splint Interventions: Ordered,Application,Adjustment   Post Interventions Patient Tolerated: Well Instructions Provided: Care of [place] 07/01/2020, 5:48 PM

## 2020-07-01 NOTE — ED Provider Notes (Signed)
Dr Marlou Sa did an arthrocentesis of the elbow.  Fluid came back with only 7000 white cells and crystals most consistent with gout.  Patient is requesting to go home.  Will increase prednisone for the next 5 days.   Blanchie Dessert, MD 07/01/20 2034

## 2020-07-01 NOTE — ED Notes (Signed)
Ortho tech from [facility] coming to see patient.

## 2020-07-01 NOTE — Progress Notes (Addendum)
Patient seen and examined.  His trauma history is less compelling in person.  He did lift a 5 gallon bucket but was having some pain in the elbow prior to that.  Describes having sweats but no chills.  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  No proximal lymphadenopathy in the right arm but he does have pain with range of motion of the elbow.  Compartments are soft.  There is pain with flexion extension pronation and supination.  EPL FPL interosseous strength intact in the hand.  No tissue crepitus with palpation.  Elbow joint effusion is present.  Discussed with Janis the importance of aspirating the elbow to rule out infection.  Although his laboratory numbers are underwhelming for infection it is possible that he could have developed that insidiously.  Avoiding seeding of his new heart valve would be important in this clinical situation.  After explaining the risk and benefits of the procedure to the patient he agreed and we sterilely prepped the right elbow joint and numbed up the skin with 5 cc of lidocaine and aspirated under ultrasound guidance about 18 cc of bloody fluid from the elbow joint.  This is sent for Gram stain cell count aerobic and anaerobic culture and crystals.  Janett Billow the RN was very helpful during this clinical encounter.  Plan is to keep the patient n.p.o. for now.  I have discussed surgical possibility with anesthesia who is comfortable with putting the patient to sleep for this particular procedure.  Also discussed the situation with Jenny Reichmann and his wife.  Plan to wait for lab results and make decision thereafter.  He would need medical admission due to the complexity of his medical problems.  Bleeding risk increased being on Plavix and aspirin but the risk-benefit ratio of delaying surgery is not in his favor if infection is present in the joint.

## 2020-07-01 NOTE — ED Notes (Signed)
Called Orthopedic Surgery x 4, no call back.

## 2020-07-01 NOTE — Consult Note (Signed)
Reason for Consult:Left elbow pain Referring Physician: Oris Drone Time called: 0730 Time at bedside: Brandon [name] is an 80 y.o. male.  HPI: Brandon [name] comes to the ED with 24h of severe RUE pain. It started yesterday morning after he had woken up out of the blue and got steadily worse as the day wore on. He describes severe pain centered around elbow and extending down to his fingers. He denies any fevers, chills, sweats, hx/o gout or similar e/o, or N/V. He did lift a 5 gallon bucket of water and pour it out but that was the day before and he had no pain associated with it. He is RHD.  Past Medical History:  Diagnosis Date  . Abdominal aneurysm ([place])   . Arthritis    "all over" (07/19/2016)  . BPH (benign prostatic hypertrophy)   . CAD (coronary artery disease)   . Chicken pox   . Chronic lower back pain    s/p surgical fusion  . Depression   . Diverticulosis   . Esophageal stricture   . GERD (gastroesophageal reflux disease)   . Hepatitis B 1984  . Hiatal hernia   . History of radiation therapy 01/16/18- 03/05/18   Left Tonsil, 66 Gy in 33 fractions to high risk nodal echelons.   Marland Kitchen HLD (hyperlipidemia)   . HTN (hypertension)   . 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 Osteoarthritis   . 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 Burt Knack and Dr Cyndia Bent   . Severe aortic stenosis   . TIA (transient ischemic attack) 1990s   hx  . tonsillar ca dx'd 11/2017  . Tubular adenoma of colon 2009    Past Surgical History:  Procedure Laterality Date  . BACK SURGERY    . CARDIAC CATHETERIZATION    . CATARACT EXTRACTION W/ INTRAOCULAR LENS  IMPLANT, BILATERAL Bilateral 01/2012 - 02/2012  . CORONARY ATHERECTOMY N/A 04/27/2020   Procedure: CORONARY ATHERECTOMY;  Surgeon: Sherren Mocha, MD;  Location: [place] [facility];  Service: Cardiovascular;  Laterality: N/A;  . CORONARY STENT INTERVENTION N/A 04/27/2020    Procedure: CORONARY STENT INTERVENTION;  Surgeon: Sherren Mocha, MD;  Location: [place] [facility];  Service: Cardiovascular;  Laterality: N/A;  . DIRECT LARYNGOSCOPY Left 10/09/2017   Procedure: DIRECT LARYNGOSCOPY WITH BOPSY;  Surgeon: Jodi Marble, MD;  Location: [facility] [place];  Service: ENT;  Laterality: Left;  . ESOPHAGOGASTRODUODENOSCOPY (EGD) WITH ESOPHAGEAL DILATION     "couple times" (07/19/2016)  . ESOPHAGOSCOPY Left 10/09/2017   Procedure: ESOPHAGOSCOPY;  Surgeon: Jodi Marble, MD;  Location: [place];  Service: ENT;  Laterality: Left;  . INTRAVASCULAR IMAGING/OCT N/A 04/27/2020   Procedure: INTRAVASCULAR IMAGING/OCT;  Surgeon: Sherren Mocha, MD;  Location: [place] [facility];  Service: Cardiovascular;  Laterality: N/A;  . INTRAVASCULAR PRESSURE WIRE/FFR STUDY N/A 04/27/2020   Procedure: INTRAVASCULAR PRESSURE WIRE/FFR STUDY;  Surgeon: Sherren Mocha, MD;  Location: [place];  Service: Cardiovascular;  Laterality: N/A;  . IR GASTROSTOMY TUBE MOD SED  01/08/2018  . IR THORACENTESIS ASP PLEURAL SPACE W/IMG GUIDE  07/19/2016  . LAPAROSCOPIC CHOLECYSTECTOMY  1994  . LUMBAR DISC SURGERY  05/1996   L4-5; Dr. Sherwood Gambler   . LUMBAR LAMINECTOMY/DECOMPRESSION MICRODISCECTOMY  10/2002   L3-4. Dr. Sherwood Gambler  . MULTIPLE TOOTH EXTRACTIONS  1980s  . PARTIAL GLOSSECTOMY  12/02/2017   Dr. Nicolette Bang- [facility]  . pharyngoplasty for  closure of tingue base defect  12/02/2017   Dr. Nicolette Bang- [facility]  . PICC LINE INSERTION  07/15/2016  . POSTERIOR LUMBAR FUSION  09/1996   Ray cage, L4-5 Dr. Rita Ohara  . PROSTATE BIOPSY  ~ 2017  . radical tonsillectomy Left 12/02/2017   Dr. [facility] at [facility]  . RIGHT HEART CATH AND CORONARY ANGIOGRAPHY N/A 03/31/2020   Procedure: RIGHT HEART CATH AND CORONARY ANGIOGRAPHY;  Surgeon: Larey Dresser, MD;  Location: [place] [facility];  Service: Cardiovascular;  Laterality: N/A;  . RIGID BRONCHOSCOPY Left 10/09/2017   Procedure: RIGID  BRONCHOSCOPY;  Surgeon: Jodi Marble, MD;  Location: [place];  Service: ENT;  Laterality: Left;  . TEE WITHOUT CARDIOVERSION N/A 06/07/2020   Procedure: TRANSESOPHAGEAL ECHOCARDIOGRAM (TEE);  Surgeon: Sherren Mocha, MD;  Location: [place];  Service: Open Heart Surgery;  Laterality: N/A;  . TONSILLECTOMY    . TRACHEOSTOMY  12/02/2017   Dr. [facility]- [facility]  . ULTRASOUND GUIDANCE FOR VASCULAR ACCESS Right 06/07/2020   Procedure: ULTRASOUND GUIDANCE FOR VASCULAR ACCESS;  Surgeon: Sherren Mocha, MD;  Location: [place];  Service: Open Heart Surgery;  Laterality: Right;  Marland Kitchen VASCULAR SURGERY      Family History  Problem Relation Age of Onset  . Heart disease Father 17       Living  . Coronary artery disease Father        CABG  . Alzheimer's disease Mother 61       Deceased  . Arthritis Mother   . Aneurysm Brother   . Stomach cancer Maternal Uncle   . Brain cancer Maternal Aunt        x2  . Obesity Daughter        Had Bypass Sx    Social History:  reports that he quit smoking about 2 years ago. His smoking use included cigarettes. He has a 75.00 pack-year smoking history. He has never used smokeless tobacco. He reports current alcohol use. He reports current drug use. Drug: Oxycodone.  Allergies:  Allergies  Allergen Reactions  . Celebrex [Celecoxib] Hives and Itching    Medications: I have reviewed the patient's current medications.  Results for orders placed or performed during the hospital encounter of 06/30/20 (from the past 48 hour(s))  CBC with Differential/Platelet     Status: Abnormal   Collection Time: 07/01/20  1:44 AM  Result Value Ref Range   WBC 9.0 4.0 - 10.5 K/uL   RBC 4.35 4.22 - 5.81 MIL/uL   Hemoglobin 11.1 (L) 13.0 - 17.0 g/dL   HCT 35.8 (L) 39.0 - 52.0 %   MCV 82.3 80.0 - 100.0 fL   MCH 25.5 (L) 26.0 - 34.0 pg   MCHC 31.0 30.0 - 36.0 g/dL   RDW 14.6 11.5 - 15.5 %   Platelets 139 (L) 150 - 400 K/uL   nRBC 0.0 0.0 - 0.2 %   Neutrophils  Relative % 84 %   Neutro Abs 7.5 1.7 - 7.7 K/uL   Lymphocytes Relative 7 %   Lymphs Abs 0.6 (L) 0.7 - 4.0 K/uL   Monocytes Relative 8 %   Monocytes Absolute 0.7 0.1 - 1.0 K/uL   Eosinophils Relative 1 %   Eosinophils Absolute 0.1 0.0 - 0.5 K/uL   Basophils Relative 0 %   Basophils Absolute 0.0 0.0 - 0.1 K/uL   Immature Granulocytes 0 %   Abs Immature Granulocytes 0.04 0.00 - 0.07 K/uL    Comment: Performed at [facility], [place]  [address], [place], [place] [address]  Comprehensive metabolic panel     Status: Abnormal   Collection Time: 07/01/20  1:44 AM  Result Value Ref Range   Sodium 138 135 - 145 mmol/L   Potassium 3.5 3.5 - 5.1 mmol/L   Chloride 101 98 - 111 mmol/L   CO2 29 22 - 32 mmol/L   Glucose, Bld 126 (H) 70 - 99 mg/dL    Comment: Glucose reference range applies only to samples taken after fasting for at least 8 hours.   BUN 16 8 - 23 mg/dL   Creatinine, Ser 0.54 (L) 0.61 - 1.24 mg/dL   Calcium 9.1 8.9 - 10.3 mg/dL   Total Protein 6.9 6.5 - 8.1 g/dL   Albumin 3.7 3.5 - 5.0 g/dL   AST 39 15 - 41 U/L   ALT 35 0 - 44 U/L   Alkaline Phosphatase 140 (H) 38 - 126 U/L   Total Bilirubin 0.7 0.3 - 1.2 mg/dL   GFR, Estimated >60 >60 mL/min    Comment: (NOTE) Calculated using the CKD-EPI Creatinine Equation (2021)    Anion gap 8 5 - 15    Comment: Performed at [facility], [place] [address], [place], [place] [address]  Sedimentation rate     Status: Abnormal   Collection Time: 07/01/20  1:44 AM  Result Value Ref Range   Sed Rate 20 (H) 0 - 16 mm/hr    Comment: Performed at [facility], [place] [address], [place], [place] [address]  C-reactive protein     Status: Abnormal   Collection Time: 07/01/20  1:44 AM  Result Value Ref Range   CRP 3.5 (H) <1.0 mg/dL    Comment: Performed at [facility], [place] [address] [place], [place] [address]  Uric acid     Status: None   Collection Time: 07/01/20  1:44 AM   Result Value Ref Range   Uric Acid, Serum 3.9 3.7 - 8.6 mg/dL    Comment: Performed at [facility], [place] [address], [place], [place] [address]  Protime-INR     Status: None   Collection Time: 07/01/20  1:44 AM  Result Value Ref Range   Prothrombin Time 13.7 11.4 - 15.2 seconds   INR 1.0 0.8 - 1.2    Comment: (NOTE) INR goal varies based on device and disease states. Performed at [facility], [place] [address] [place], [place] [address]     DG Elbow Complete Right  Result Date: 06/30/2020 CLINICAL DATA:  Elbow pain EXAM: RIGHT ELBOW - COMPLETE 3+ VIEW COMPARISON:  None. FINDINGS: Positive for elbow effusion. No definitive fracture or malalignment is seen. Moderate degenerative changes anteriorly and at the proximal radioulnar articulation. Probable soft tissue calcifications adjacent to the lateral epicondyle. IMPRESSION: No definite acute osseous abnormality, however elbow effusion is present. There are moderate degenerative changes. Electronically Signed   By: Donavan Foil M.D.   On: 06/30/2020 21:57   CT Elbow Right Wo Contrast  Result Date: 07/01/2020 CLINICAL DATA:  Elbow pain. Stress fracture suspected. Negative x-ray. Swelling and warmth to rt elbow after picking up a heavy bucket 06/29/20, plain films done 06/30/20 EXAM: CT OF THE LOWER RIGHT EXTREMITY WITHOUT CONTRAST TECHNIQUE: Multidetector CT imaging of the right lower extremity was performed according to the standard protocol. COMPARISON:  X-ray left elbow 06/30/2020 FINDINGS: Bones/Joint/Cartilage Cortical irregularity of the supracondylar humerus (7:27-29). No definite radial or olecranon fracture. No elbow dislocation. Associated elbow effusion. Ligaments  Suboptimally assessed by CT. Muscles and Tendons Unremarkable. Soft tissues Unremarkable. IMPRESSION: Cortical irregularity along the supracondylar humerus suggestive of a nondisplaced fracture. Associated elbow effusion. Electronically  Signed   By: Iven Finn M.D.   On: 07/01/2020 03:29    Review of Systems  Constitutional: Negative for chills, diaphoresis and fever.  HENT: Negative for ear discharge, ear pain, hearing loss and tinnitus.   Eyes: Negative for photophobia and pain.  Respiratory: Negative for cough and shortness of breath.   Cardiovascular: Negative for chest pain.  Gastrointestinal: Negative for abdominal pain, nausea and vomiting.  Genitourinary: Negative for dysuria, flank pain, frequency and urgency.  Musculoskeletal: Positive for arthralgias (Right elbow) and myalgias (Right FA/hand). Negative for back pain and neck pain.  Neurological: Negative for dizziness and headaches.  Hematological: Does not bruise/bleed easily.  Psychiatric/Behavioral: The patient is not nervous/anxious.    Blood pressure (!) 160/69, pulse 67, temperature 98.1 F (36.7 C), temperature source Oral, resp. rate 17, SpO2 95 %. Physical Exam Constitutional:      General: He is not in acute distress.    Appearance: He is well-developed. He is not diaphoretic.  HENT:     Head: Normocephalic and atraumatic.  Eyes:     General: No scleral icterus.       Right eye: No discharge.        Left eye: No discharge.     Conjunctiva/sclera: Conjunctivae normal.  Cardiovascular:     Rate and Rhythm: Normal rate and regular rhythm.  Pulmonary:     Effort: Pulmonary effort is normal. No respiratory distress.  Musculoskeletal:     Cervical back: Normal range of motion.     Comments: Right shoulder, elbow, wrist, digits- no skin wounds, mod TTP elbow joint line, severe pain with AROM elbow that extends into entire FA, mod pain with PROM elbow but able to range about 20 degrees, minimal TTP FA, compartments soft, no instability, no blocks to motion  Sens  Ax/R/M/U intact  Mot   Ax/ R/ PIN/ M/ AIN/ U intact  Rad 2+  Skin:    General: Skin is warm and dry.  Neurological:     Mental Status: He is alert.  Psychiatric:         Behavior: Behavior normal.     Assessment/Plan: Right elbow/FA pain -- I'm not sure what to make of this. Will get MRI and have extended study to include FA. Expect will need to get elbow arthrocentesis as well.     Lisette Abu, PA-C Orthopedic Surgery [phone] 07/01/2020, 10:53 AM

## 2020-07-02 LAB — SYNOVIAL CELL COUNT + DIFF, W/ CRYSTALS
Eosinophils-Synovial: 0 % (ref 0–1)
Lymphocytes-Synovial Fld: 10 % (ref 0–20)
Monocyte-Macrophage-Synovial Fluid: 5 % — ABNORMAL LOW (ref 50–90)
Neutrophil, Synovial: 85 % — ABNORMAL HIGH (ref 0–25)
WBC, Synovial: 7340 /mm3 — ABNORMAL HIGH (ref 0–200)

## 2020-07-05 ENCOUNTER — Ambulatory Visit (INDEPENDENT_AMBULATORY_CARE_PROVIDER_SITE_OTHER): Payer: Medicare Other | Admitting: Physician Assistant

## 2020-07-05 ENCOUNTER — Other Ambulatory Visit: Payer: Self-pay

## 2020-07-05 ENCOUNTER — Encounter: Payer: Self-pay | Admitting: Physician Assistant

## 2020-07-05 VITALS — BP 103/62 | HR 59 | Resp 17 | Ht 68.0 in | Wt 167.0 lb

## 2020-07-05 DIAGNOSIS — I714 Abdominal aortic aneurysm, without rupture: Secondary | ICD-10-CM

## 2020-07-05 DIAGNOSIS — Z7952 Long term (current) use of systemic steroids: Secondary | ICD-10-CM | POA: Diagnosis not present

## 2020-07-05 DIAGNOSIS — M1A0210 Idiopathic chronic gout, right elbow, without tophus (tophi): Secondary | ICD-10-CM

## 2020-07-05 DIAGNOSIS — Z79899 Other long term (current) drug therapy: Secondary | ICD-10-CM | POA: Diagnosis not present

## 2020-07-05 DIAGNOSIS — Z8719 Personal history of other diseases of the digestive system: Secondary | ICD-10-CM

## 2020-07-05 DIAGNOSIS — Z9889 Other specified postprocedural states: Secondary | ICD-10-CM

## 2020-07-05 DIAGNOSIS — M19042 Primary osteoarthritis, left hand: Secondary | ICD-10-CM

## 2020-07-05 DIAGNOSIS — K750 Abscess of liver: Secondary | ICD-10-CM

## 2020-07-05 DIAGNOSIS — F419 Anxiety disorder, unspecified: Secondary | ICD-10-CM

## 2020-07-05 DIAGNOSIS — S61411S Laceration without foreign body of right hand, sequela: Secondary | ICD-10-CM

## 2020-07-05 DIAGNOSIS — Z8639 Personal history of other endocrine, nutritional and metabolic disease: Secondary | ICD-10-CM

## 2020-07-05 DIAGNOSIS — I7143 Infrarenal abdominal aortic aneurysm, without rupture: Secondary | ICD-10-CM

## 2020-07-05 DIAGNOSIS — M353 Polymyalgia rheumatica: Secondary | ICD-10-CM | POA: Diagnosis not present

## 2020-07-05 DIAGNOSIS — L409 Psoriasis, unspecified: Secondary | ICD-10-CM

## 2020-07-05 DIAGNOSIS — R1312 Dysphagia, oropharyngeal phase: Secondary | ICD-10-CM

## 2020-07-05 DIAGNOSIS — C090 Malignant neoplasm of tonsillar fossa: Secondary | ICD-10-CM

## 2020-07-05 DIAGNOSIS — G894 Chronic pain syndrome: Secondary | ICD-10-CM

## 2020-07-05 DIAGNOSIS — Z720 Tobacco use: Secondary | ICD-10-CM

## 2020-07-05 DIAGNOSIS — Z952 Presence of prosthetic heart valve: Secondary | ICD-10-CM

## 2020-07-05 DIAGNOSIS — I10 Essential (primary) hypertension: Secondary | ICD-10-CM

## 2020-07-05 DIAGNOSIS — M5136 Other intervertebral disc degeneration, lumbar region: Secondary | ICD-10-CM

## 2020-07-05 DIAGNOSIS — Z13820 Encounter for screening for osteoporosis: Secondary | ICD-10-CM

## 2020-07-05 DIAGNOSIS — F32A Depression, unspecified: Secondary | ICD-10-CM

## 2020-07-05 DIAGNOSIS — M19041 Primary osteoarthritis, right hand: Secondary | ICD-10-CM

## 2020-07-05 LAB — BODY FLUID CULTURE W GRAM STAIN: Culture: NO GROWTH

## 2020-07-06 ENCOUNTER — Telehealth: Payer: Self-pay

## 2020-07-06 ENCOUNTER — Other Ambulatory Visit: Payer: Self-pay

## 2020-07-06 LAB — CULTURE, BLOOD (ROUTINE X 2)
Culture: NO GROWTH
Culture: NO GROWTH
Special Requests: ADEQUATE
Special Requests: ADEQUATE

## 2020-07-07 ENCOUNTER — Other Ambulatory Visit (HOSPITAL_COMMUNITY): Payer: Medicare Other

## 2020-07-07 ENCOUNTER — Ambulatory Visit: Payer: Medicare Other | Admitting: Physician Assistant

## 2020-07-07 ENCOUNTER — Encounter (HOSPITAL_COMMUNITY): Payer: Self-pay | Admitting: Physician Assistant

## 2020-07-07 MED ORDER — OXYCODONE HCL 5 MG PO TABS: 5.0000 mg | ORAL_TABLET | Freq: Four times a day (QID) | ORAL | 0 refills | Status: DC | PRN

## 2020-07-07 NOTE — Telephone Encounter (Signed)
rx request 

## 2020-07-07 NOTE — Progress Notes (Deleted)
HEART AND [place]                                     Cardiology Office Note:    Date:  07/07/2020   ID:  Brandon [name], DOB [birth year], MRN [id number]  PCP:  Brandon Salter, MD  [facility] Cardiologist:  Brandon Champagne, MD / Dr. Burt [name] & Dr. Cyndia [name] (TAVR) [facility] Electrophysiologist:  None   Referring MD: Brandon Salter, MD   1 month s/p TAVR   History of Present Illness:    Brandon [name] is a 80 y.o. 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   The patient has been followed for aortic stenosis with surveillance echo studies and close clinical care by Dr. Aundra [name]. He has developed progressive and now severe aortic stenosis with mean gradient of 50 mmHg and calculated valve area of 0.96 cm. LVEF remains preserved at 60 to 65%. He underwent cardiac catheterization for preoperative assessment and was demonstrated to have chronic occlusion of the right coronary artery with left-to-right collaterals, severe calcific proximal LAD stenosis, and moderately severe long segment mid circumflex stenosis.The patient reported exertional chest pain and dyspnea. He was referred to the multidisciplinary valve team for consideration of SAVR+CABG or TAVR+PCI.Cardiac gated CTA of the heart revealedanatomical characteristics consistent with aortic stenosis suitable for treatment by transcatheter aortic valve replacement without any significant complicating features.CTA of the aorta and iliac vessels demonstrateda large infrarenal abdominal aortic  aneurysm which is increasing in size, currently measuring 5.0x 4.2 cm in diameter (previously 4.3 cm on 02/26/2017).Seen by Dr. Donzetta [name] with vascular who recommended repeat CT scan in 5-6 months and proceed with TAVR. He felt he may be a candidate for endovascular repair in the future.     He was evaluated by the multidisciplinary valve team inlcuding Dr. Burt [name] and Dr Brandon [name] who felt PCI+TAVR via the subclavian approach was the most appropriate course of action for the patient. He underwentsuccessful orbital atherectomy and stenting of severe calcific stenosis in the proximal LAD, procedure guided by OCT imaging, treated with a 4.0 x 12 mm resolute Onyx DES. There was moderate left circumflex stenosis with negative pressure wire analysis.He wascontinued on uninterrupted DAPT with aspirin and Plavix x 6 months.  He underwent successful TAVR with a 29 mm Edwards Sapien 3 THV via the subclavian approach on 06/07/20. Post operative echo showed EF 60%, normally functioning TAVR with a mean gradient of 8 mmHg and no PVL. He was continued on DAPT with aspirin and plavix. He was supplemented with potassium due to hypokalemia. He did have some shoulder pain after the procedure for which he had taken some extra pain pills. In follow up he was depressed due to increased pain and possible PMR flare. After discussion with his pain medicine Dr, it was decided to call him in extra pain medication.   Today he presents to clinic for follow up.   Past Medical History:  Diagnosis Date  . Abdominal aneurysm ([place])   . Arthritis    "all over" (07/19/2016)  . BPH (  benign prostatic hypertrophy)   . CAD (coronary artery disease)   . Chicken pox   . Chronic lower back pain    s/p surgical fusion  . Depression   . Diverticulosis   . Esophageal stricture   . GERD (gastroesophageal reflux disease)   . Hepatitis B 1984  . Hiatal hernia   . History of radiation therapy 01/16/18- 03/05/18   Left Tonsil, 66 Gy in 33  fractions to high risk nodal echelons.   Marland Kitchen HLD (hyperlipidemia)   . HTN (hypertension)   . 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 Osteoarthritis   . 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 Burt [name] and Dr Brandon [name]   . Severe aortic stenosis   . TIA (transient ischemic attack) 1990s   hx  . tonsillar ca dx'd 11/2017  . Tubular adenoma of colon 2009    Past Surgical History:  Procedure Laterality Date  . BACK SURGERY    . CARDIAC CATHETERIZATION    . CATARACT EXTRACTION W/ INTRAOCULAR LENS  IMPLANT, BILATERAL Bilateral 01/2012 - 02/2012  . CORONARY ATHERECTOMY N/A 04/27/2020   Procedure: CORONARY ATHERECTOMY;  Surgeon: Brandon Mocha, MD;  Location: [place] [facility];  Service: Cardiovascular;  Laterality: N/A;  . CORONARY STENT INTERVENTION N/A 04/27/2020   Procedure: CORONARY STENT INTERVENTION;  Surgeon: Brandon Mocha, MD;  Location: [place] [facility];  Service: Cardiovascular;  Laterality: N/A;  . DIRECT LARYNGOSCOPY Left 10/09/2017   Procedure: DIRECT LARYNGOSCOPY WITH BOPSY;  Surgeon: Brandon Marble, MD;  Location: [place];  Service: ENT;  Laterality: Left;  . ESOPHAGOGASTRODUODENOSCOPY (EGD) WITH ESOPHAGEAL DILATION     "couple times" (07/19/2016)  . ESOPHAGOSCOPY Left 10/09/2017   Procedure: ESOPHAGOSCOPY;  Surgeon: Brandon Marble, MD;  Location: [place];  Service: ENT;  Laterality: Left;  . INTRAVASCULAR IMAGING/OCT N/A 04/27/2020   Procedure: INTRAVASCULAR IMAGING/OCT;  Surgeon: Brandon Mocha, MD;  Location: [place] [facility];  Service: Cardiovascular;  Laterality: N/A;  . INTRAVASCULAR PRESSURE WIRE/FFR STUDY N/A 04/27/2020   Procedure: INTRAVASCULAR PRESSURE WIRE/FFR STUDY;  Surgeon: Brandon Mocha, MD;  Location: [place] [facility];  Service: Cardiovascular;  Laterality: N/A;  . IR GASTROSTOMY TUBE MOD SED  01/08/2018  . IR THORACENTESIS ASP PLEURAL SPACE  W/IMG GUIDE  07/19/2016  . LAPAROSCOPIC CHOLECYSTECTOMY  1994  . LUMBAR DISC SURGERY  05/1996   L4-5; Dr. Sherwood [name]   . LUMBAR LAMINECTOMY/DECOMPRESSION MICRODISCECTOMY  10/2002   L3-4. Dr. Sherwood [name]  . MULTIPLE TOOTH EXTRACTIONS  1980s  . PARTIAL GLOSSECTOMY  12/02/2017   Dr. Nicolette Bang- [facility]  . pharyngoplasty for closure of tingue base defect  12/02/2017   Dr. Nicolette Bang- [facility]  . PICC LINE INSERTION  07/15/2016  . POSTERIOR LUMBAR FUSION  09/1996   Ray cage, L4-5 Dr. Rita Ohara  . PROSTATE BIOPSY  ~ 2017  . radical tonsillectomy Left 12/02/2017   Dr. Nicolette Bang at [facility]  . RIGHT HEART CATH AND CORONARY ANGIOGRAPHY N/A 03/31/2020   Procedure: RIGHT HEART CATH AND CORONARY ANGIOGRAPHY;  Surgeon: Larey Dresser, MD;  Location: [place] [facility];  Service: Cardiovascular;  Laterality: N/A;  . RIGID BRONCHOSCOPY Left 10/09/2017   Procedure: RIGID BRONCHOSCOPY;  Surgeon: Brandon Marble, MD;  Location: [place];  Service: ENT;  Laterality: Left;  . TEE WITHOUT CARDIOVERSION N/A 06/07/2020   Procedure: TRANSESOPHAGEAL ECHOCARDIOGRAM (TEE);  Surgeon: Brandon Mocha, MD;  Location: [place];  Service:  Open Heart Surgery;  Laterality: N/A;  . TONSILLECTOMY    . TRACHEOSTOMY  12/02/2017   Dr. Nicolette Bang- [facility]  . ULTRASOUND GUIDANCE FOR VASCULAR ACCESS Right 06/07/2020   Procedure: ULTRASOUND GUIDANCE FOR VASCULAR ACCESS;  Surgeon: Brandon Mocha, MD;  Location: [place];  Service: Open Heart Surgery;  Laterality: Right;  Marland Kitchen VASCULAR SURGERY      Current Medications: No outpatient medications have been marked as taking for the 07/07/20 encounter (Appointment) with Eileen Stanford, PA-C.     Allergies:   Celebrex [celecoxib]   Social History   Socioeconomic History  . Marital status: Married    Spouse name: etta  . Number of children: 2  . Years of education: Not on file  . Highest education level: Not on file  Occupational History  . Occupation: retired    Fish farm manager: RETIRED     Comment: disabled due to back problems  Tobacco Use  . Smoking status: Former Smoker    Packs/day: 1.50    Years: 50.00    Pack years: 75.00    Types: Cigarettes    Quit date: 09/05/2017    Years since quitting: 2.8  . Smokeless tobacco: Never Used  . Tobacco comment: smoked less than 1 ppd for 40+ years; Using Vapor Cig  Vaping Use  . Vaping Use: Never used  Substance and Sexual Activity  . Alcohol use: Yes    Alcohol/week: 0.0 standard drinks    Comment: Occasionally,not a heavy drinker   . Drug use: Yes    Types: Oxycodone  . Sexual activity: Never  Other Topics Concern  . Not on file  Social History Narrative   ** Merged History Encounter **       Married (3rd), [place]. 2 children from 1st marriage, 4 step children.    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       Cell # [phone]   Social Determinants of Health   Financial Resource Strain: Low Risk   . Difficulty of Paying Living Expenses: Not hard at all  Food Insecurity: No Food Insecurity  . Worried About Charity fundraiser in the Last Year: Never true  . Ran Out of Food in the Last Year: Never true  Transportation Needs: No Transportation Needs  . Lack of Transportation (Medical): No  . Lack of Transportation (Non-Medical): No  Physical Activity: Inactive  . Days of Exercise per Week: 0 days  . Minutes of Exercise per Session: 0 min  Stress: No Stress Concern Present  . Feeling of Stress : Not at all  Social Connections: Moderately Isolated  . Frequency of Communication with Friends and Family: More than three times a week  . Frequency of Social Gatherings with Friends and Family: Once a week  . Attends Religious Services: Never  . Active Member of Clubs or Organizations: No  . Attends Archivist Meetings: Never  . Marital Status: Married     Family History: The patient's family history includes Alzheimer's  disease (age of onset: 53) in his mother; Aneurysm in his brother; Arthritis in his mother; Brain cancer in his maternal aunt; Coronary artery disease in his father; Heart disease (age of onset: 60) in his father; Obesity in his daughter; Stomach cancer in his maternal uncle.  ROS:   Please see the history of present illness.    All other systems reviewed and are negative.  EKGs/Labs/Other  Studies Reviewed:    The following studies were reviewed today:   TAVR OPERATIVE NOTE   Date of Procedure:06/07/2020  Preoperative Diagnosis:Severe Aortic Stenosis   Postoperative Diagnosis:Same   Procedure:   Transcatheter Aortic Valve Replacement -Left Subclavian ArteryApproach Edwards Sapien 3 THV (size 17mm, model # 9600TFX, serial #[id number])  Co-Surgeons:Bryan Alveria Apley, MD and Brandon Mocha, MD   Anesthesiologist:David Linna Caprice, MD  Echocardiographer:Peter Johnsie Cancel, MD  Pre-operative Echo Findings: ? Severe aortic stenosis, Mild aortic insufficiency ? Normalleft ventricular systolic function  Post-operative Echo Findings: ? Trivialparavalvular leak ? Normalleft ventricular systolic function  _____________   Echo 06/08/20: IMPRESSIONS  1. Left ventricular ejection fraction, by estimation, is 60 to 65%. The  left ventricle has normal function. The left ventricle has no regional  wall motion abnormalities. There is severe asymmetric left ventricular  hypertrophy of the basal and septal segments. Left ventricular diastolic parameters were normal.  2. Right ventricular systolic function is normal. The right ventricular  size is normal.  3. Left atrial size was moderately dilated.  4. The mitral valve is abnormal. Mild mitral valve regurgitation. No  evidence of mitral stenosis.  5. Post TAVR with 29 mm Sapien 3 valve no significant PVL mean gradient 8  peak 17 mmHg AVA 5.6 cm2 DVI 0.91 . The aortic valve is normal in  structure. Aortic valve regurgitation is not visualized. No aortic  stenosis is present.  6. Aortic dilatation noted. There is mild dilatation of the ascending  aorta, measuring 41 mm.  7. The inferior vena cava is normal in size with greater than 50%  respiratory variability, suggesting right atrial pressure of 3 mmHg.  _________________________   Echo 07/07/20 ***    EKG:  EKG is NOT ordered today  Recent Labs: 03/25/2020: TSH 1.569 06/08/2020: Magnesium 1.8 07/01/2020: ALT 35; BUN 16; Creatinine, Ser 0.54; Hemoglobin 11.1; Platelets 139; Potassium 3.5; Sodium 138  Recent Lipid Panel    Component Value Date/Time   CHOL 160 05/23/2020 1149   TRIG 98 05/23/2020 1149   HDL 55 05/23/2020 1149   CHOLHDL 2.9 05/23/2020 1149   VLDL 20 05/23/2020 1149   LDLCALC 85 05/23/2020 1149   LDLDIRECT 81.2 05/25/2008 0906     Risk Assessment/Calculations:       Physical Exam:    VS:  There were no vitals taken for this visit.    Wt Readings from Last 3 Encounters:  07/05/20 167 lb (75.8 kg)  06/15/20 168 lb (76.2 kg)  06/08/20 174 lb 9.7 oz (79.2 kg)     GEN: Well nourished, well developed in no acute distress HEENT: Normal NECK: No JVD LYMPHATICS: No lymphadenopathy CARDIAC: RRR, soft flow murmur. No rubs, gallops RESPIRATORY:  Clear to auscultation without rales, wheezing or rhonchi  ABDOMEN: Soft, non-tender, non-distended MUSCULOSKELETAL:  No edema; No deformity  SKIN: Warm and dry. Subclavian site healing well with very mild hematoma and resolving ecchymosis over chest and arm NEUROLOGIC:  Alert and oriented x 3 PSYCHIATRIC:  Normal affect   ASSESSMENT:    1. S/P TAVR (transcatheter aortic valve replacement)   2. Coronary artery disease involving native coronary artery of native heart without angina pectoris   3. Aneurysm of infrarenal abdominal aorta (HCC)   4. Essential hypertension   5.  Hypokalemia   6. Polymyalgia rheumatica (HCC)    PLAN:    In order of problems listed above:  Severe AS s/p TAVR:  SBE prophylaxis discussed; the patient is edentulous and does not go to the dentist.  CAD with angina: he underwent successful orbital atherectomy and stenting of severe calcific stenosis in the proximal LAD, procedure guided by OCT imaging, treated with a 4.0 x 12 mm resolute Onyx DES. There was moderate left circumflex stenosis with negative pressure wire analysis.He will be continued on uninterrupted DAPT with aspirin and Plavix x 6 months. Continue statin, BB and nitrates.No chest pain.   AAA: pre TAVR CT showeda large infrarenal abdominal aortic aneurysm which is increasing in size, currently measuring 5.0x 4.2 cm in diameter (previously 4.3 cm on 02/26/2017). Followed by Dr. Donzetta [name].   HTN:BP well controlled today.   Hypokalemia: resolved with supplementation   Polymyalgia rheumatica with chronic pain: recently seen by rheum    Medication Adjustments/Labs and Tests Ordered: Current medicines are reviewed at length with the patient today.  Concerns regarding medicines are outlined above.  No orders of the defined types were placed in this encounter.  No orders of the defined types were placed in this encounter.   There are no Patient Instructions on file for this visit.   Signed, Angelena Form, PA-C  07/07/2020 11:00 AM    [facility]

## 2020-07-11 ENCOUNTER — Telehealth: Payer: Self-pay | Admitting: Physical Medicine & Rehabilitation

## 2020-07-11 ENCOUNTER — Telehealth: Payer: Self-pay | Admitting: Physical Medicine and Rehabilitation

## 2020-07-11 MED ORDER — OXYCODONE HCL 5 MG PO TABS: 5.0000 mg | ORAL_TABLET | Freq: Four times a day (QID) | ORAL | 0 refills | Status: DC | PRN

## 2020-07-11 NOTE — Telephone Encounter (Signed)
Patient called and said he normally gets 120 pills his rx only had 24 - please call and correct with ptn and pharmacy his number [phone]

## 2020-07-13 NOTE — Telephone Encounter (Signed)
Notified. 

## 2020-07-14 ENCOUNTER — Telehealth (HOSPITAL_COMMUNITY): Payer: Self-pay

## 2020-07-14 ENCOUNTER — Encounter (HOSPITAL_COMMUNITY): Payer: Self-pay

## 2020-07-14 NOTE — Telephone Encounter (Signed)
Attempted to call patient in regards to Cardiac Rehab - LM on VM Mailed letter 

## 2020-07-15 NOTE — Telephone Encounter (Signed)
Pt returned phone call and stated that he is not interested in the cardiac rehab program and that he feels great! Closed referral

## 2020-07-19 ENCOUNTER — Other Ambulatory Visit: Payer: Self-pay

## 2020-07-20 ENCOUNTER — Encounter: Payer: Self-pay | Admitting: Family Medicine

## 2020-07-20 ENCOUNTER — Ambulatory Visit (INDEPENDENT_AMBULATORY_CARE_PROVIDER_SITE_OTHER): Payer: Medicare Other | Admitting: Family Medicine

## 2020-07-20 VITALS — BP 130/70 | HR 67 | Temp 97.3°F | Ht 68.0 in | Wt 170.0 lb

## 2020-07-20 DIAGNOSIS — J449 Chronic obstructive pulmonary disease, unspecified: Secondary | ICD-10-CM | POA: Insufficient documentation

## 2020-07-20 DIAGNOSIS — M109 Gout, unspecified: Secondary | ICD-10-CM | POA: Diagnosis not present

## 2020-07-20 DIAGNOSIS — J310 Chronic rhinitis: Secondary | ICD-10-CM

## 2020-07-20 DIAGNOSIS — Z952 Presence of prosthetic heart valve: Secondary | ICD-10-CM

## 2020-07-20 DIAGNOSIS — Z955 Presence of coronary angioplasty implant and graft: Secondary | ICD-10-CM | POA: Diagnosis not present

## 2020-07-20 LAB — URIC ACID: Uric Acid, Serum: 4.7 mg/dL (ref 4.0–7.8)

## 2020-07-20 MED ORDER — FLUTICASONE PROPIONATE 50 MCG/ACT NA SUSP: 2.0000 | Freq: Every day | NASAL | 3 refills | Status: DC | PRN

## 2020-07-20 MED ORDER — ALBUTEROL SULFATE HFA 108 (90 BASE) MCG/ACT IN AERS: 2.0000 | INHALATION_SPRAY | Freq: Four times a day (QID) | RESPIRATORY_TRACT | 2 refills | Status: DC | PRN

## 2020-07-20 MED ORDER — SPIRIVA RESPIMAT 2.5 MCG/ACT IN AERS: 2.0000 | INHALATION_SPRAY | Freq: Every day | RESPIRATORY_TRACT | 3 refills | Status: DC

## 2020-07-20 NOTE — Progress Notes (Signed)
[facility] [address] [place] [address] Dept: [phone] Dept Fax: [phone]  Office Visit  Subjective:    Patient ID: Brandon [name], male    DOB: [birth year], 80 y.o..   MRN: [id number]  Chief Complaint  Patient presents with   Follow-up    3 month f/u HTN.  Average BP at home 140/68 -125/62. C/o having sinus drainage due to cancer treatments on throat and has been having some wheezing.  Has using an old inhaler. Also some LT ear pressure.     History of Present Illness:  Patient is in today complaining of issues with chronic nasal mucous and occasional wheezing. 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 He notes that since the time of his surgery, he has had recurrent issues with his left ear not equalizing well. Sometimes, he can pop his ear, but blowing against an obstructed nose, but other times this does not work. He notes that he frequently has issues with thick mucous that he blows from the nose. At times, he has post-nasal drainage of this same thick mucous. He does use nasal saline washes to try and help. He notes that periodically, he has wheezing and then coughs up thick mucous. His prior PCP had provided him with a couple of inhalers that seemed ot help loosen his breathing and assist him in bringing up mucous. He has a past history of heavy too use, though he is not a current smoker.  Since his last visit with me, Brandon [name] has undergone a cardiac catheterization with atherectomy and stent placement in the proximal LAD (04/27/2020) and a transcatheter aortic valve replacement (06/06/2020). He notes that he is now feeling much improved, esp. int he last 5 days. He feels he has his energy back and is pleased with the outcomes of these two procedures. He has a history of a 5 cm abdominal aortic aneurysm. He notes the vascular surgeon wants to monitor this and delay  intervention until it reaches about 5.5 cm size, possibly later this year.  Brandon [name] was seen on 5/26 in the ED with a right elbow effusion. He had this drained and apparently the fluid analysis showed both uric acid and calcium pyrophosphate crystals. He feels he is doing much better now after the joint aspiration.  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   Primary osteoarthritis of both hands 01/11/2020   Piriformis syndrome of both sides 09/23/2019   Pain of both shoulder joints 07/29/2019   Sciatica associated with disorder of lumbar spine 04/24/2019   Primary osteoarthritis involving multiple joints 04/24/2019   Chronic pain syndrome 04/24/2019   Anxiety and depression 04/15/2018   Oropharyngeal dysphagia 03/11/2018   Cancer of tonsillar fossa ([place]) 09/20/2017   Liver abscess 07/10/2016   Aneurysm of infrarenal abdominal aorta ([place]) 07/10/2016   Normocytic anemia 07/10/2016   Essential hypertension 05/19/2013   Osteoarthritis 05/29/2009   Psoriasis 01/13/2009   Coronary artery disease 09/15/2008   Severe aortic stenosis 01/28/2008   Diverticulosis of colon 08/27/2007   History of benign prostatic hyperplasia 08/27/2007   Hyperlipidemia 03/19/2007   Gastroesophageal reflux disease 03/19/2007   Past Surgical  History:  Procedure Laterality Date   BACK SURGERY     CARDIAC CATHETERIZATION     CATARACT EXTRACTION W/ INTRAOCULAR LENS  IMPLANT, BILATERAL Bilateral 01/2012 - 02/2012   CORONARY ATHERECTOMY N/A 04/27/2020   Procedure: CORONARY ATHERECTOMY;  Surgeon: Sherren Mocha, MD;  Location: [facility];  Service: Cardiovascular;  Laterality: N/A;   CORONARY STENT INTERVENTION N/A  04/27/2020   Procedure: CORONARY STENT INTERVENTION;  Surgeon: Sherren Mocha, MD;  Location: [facility];  Service: Cardiovascular;  Laterality: N/A;   DIRECT LARYNGOSCOPY Left 10/09/2017   Procedure: DIRECT LARYNGOSCOPY WITH BOPSY;  Surgeon: Jodi Marble, MD;  Location: [facility];  Service: ENT;  Laterality: Left;   ESOPHAGOGASTRODUODENOSCOPY (EGD) WITH ESOPHAGEAL DILATION     "couple times" (07/19/2016)   ESOPHAGOSCOPY Left 10/09/2017   Procedure: ESOPHAGOSCOPY;  Surgeon: Jodi Marble, MD;  Location: [facility];  Service: ENT;  Laterality: Left;   INTRAVASCULAR IMAGING/OCT N/A 04/27/2020   Procedure: INTRAVASCULAR IMAGING/OCT;  Surgeon: Sherren Mocha, MD;  Location: [facility];  Service: Cardiovascular;  Laterality: N/A;   INTRAVASCULAR PRESSURE WIRE/FFR STUDY N/A 04/27/2020   Procedure: INTRAVASCULAR PRESSURE WIRE/FFR STUDY;  Surgeon: Sherren Mocha, MD;  Location: [facility];  Service: Cardiovascular;  Laterality: N/A;   IR GASTROSTOMY TUBE MOD SED  01/08/2018   IR THORACENTESIS ASP PLEURAL SPACE W/IMG GUIDE  07/19/2016   LAPAROSCOPIC CHOLECYSTECTOMY  1994   LUMBAR [place] SURGERY  05/1996   L4-5; Dr. Claudean Kinds LAMINECTOMY/DECOMPRESSION MICRODISCECTOMY  10/2002   L3-4. Dr. Sherwood Gambler   MULTIPLE TOOTH EXTRACTIONS  1980s   PARTIAL GLOSSECTOMY  12/02/2017   Dr. Nicolette Bang- [facility]   pharyngoplasty for closure of tingue base defect  12/02/2017   Dr. Nicolette Bang- [place]  07/15/2016   POSTERIOR LUMBAR FUSION  09/1996   Ray cage, L4-5 Dr. Rita Ohara   PROSTATE BIOPSY  ~ 2017   radical tonsillectomy Left 12/02/2017   Dr. Nicolette Bang at [place] N/A 03/31/2020   Procedure: RIGHT HEART CATH AND CORONARY ANGIOGRAPHY;  Surgeon: Larey Dresser, MD;  Location: [facility];  Service: Cardiovascular;  Laterality: N/A;   RIGID BRONCHOSCOPY Left 10/09/2017   Procedure: RIGID BRONCHOSCOPY;   Surgeon: Jodi Marble, MD;  Location: [facility];  Service: ENT;  Laterality: Left;   TEE WITHOUT CARDIOVERSION N/A 06/07/2020   Procedure: TRANSESOPHAGEAL ECHOCARDIOGRAM (TEE);  Surgeon: Sherren Mocha, MD;  Location: [facility];  Service: Open Heart Surgery;  Laterality: N/A;   TONSILLECTOMY     TRACHEOSTOMY  12/02/2017   Dr. Nicolette Bang- [facility]   ULTRASOUND GUIDANCE FOR VASCULAR ACCESS Right 06/07/2020   Procedure: ULTRASOUND GUIDANCE FOR VASCULAR ACCESS;  Surgeon: Sherren Mocha, MD;  Location: [facility];  Service: Open Heart Surgery;  Laterality: Right;   VASCULAR SURGERY     Family History  Problem Relation Age of Onset   Heart disease Father 87       Living   Coronary artery disease Father        CABG   Alzheimer's disease Mother 78       Deceased   Arthritis Mother    Aneurysm Brother    Stomach cancer Maternal Uncle    Brain cancer Maternal Aunt        x2   Obesity Daughter        Had Bypass Sx   Outpatient Medications Prior to Visit  Medication  Sig Dispense Refill   amLODipine (NORVASC) 10 MG tablet TAKE 1 TABLET(10 MG) BY MOUTH DAILY 90 tablet 3   aspirin 81 MG EC tablet Take 81 mg by mouth at bedtime.     atorvastatin (LIPITOR) 80 MG tablet Take 1 tablet (80 mg total) by mouth daily. 90 tablet 3   carvedilol (COREG) 6.25 MG tablet Take 1 tablet (6.25 mg total) by mouth 2 (two) times daily. 60 tablet 11   citalopram (CELEXA) 20 MG tablet Take 1 tablet (20 mg total) by mouth daily. 30 tablet 5   clopidogrel (PLAVIX) 75 MG tablet Take 1 tablet (75 mg total) by mouth daily. 90 tablet 3   ezetimibe (ZETIA) 10 MG tablet Take 1 tablet (10 mg total) by mouth daily. 30 tablet 11   gabapentin (NEURONTIN) 300 MG capsule TAKE 1 CAPSULE(300 MG) BY MOUTH THREE TIMES DAILY (Patient taking differently: Take 300 mg by mouth 3 (three) times daily.) 90 capsule 5   isosorbide mononitrate (IMDUR) 30 MG 24 hr tablet TAKE 1 TABLET(30 MG) BY MOUTH AT BEDTIME (Patient taking differently: Take  30 mg by mouth at bedtime.) 90 tablet 1   lisinopril (ZESTRIL) 10 MG tablet TAKE 1 TABLET(10 MG) BY MOUTH DAILY (Patient taking differently: Take 10 mg by mouth daily.) 90 tablet 3   oxyCODONE (OXY IR/ROXICODONE) 5 MG immediate release tablet Take 1-2 tablets (5-10 mg total) by mouth every 6 (six) hours as needed for severe pain (to take 2 tabs ONLY when severe pain- which is not usual for pt). 120 tablet 0   pantoprazole (PROTONIX) 40 MG tablet Take 1 tablet (40 mg total) by mouth daily. 90 tablet 3   predniSONE (DELTASONE) 1 MG tablet Take 2 tablets p.o. every morning (Patient taking differently: Take 2 mg by mouth daily with breakfast.) 60 tablet 2   predniSONE (DELTASONE) 5 MG tablet TAKE 1 TABLET(5 MG) BY MOUTH DAILY WITH BREAKFAST (Patient taking differently: Take 5 mg by mouth daily with breakfast.) 30 tablet 2   tamsulosin (FLOMAX) 0.4 MG CAPS capsule Take 0.4 mg by mouth at bedtime.     traMADol (ULTRAM) 50 MG tablet Take 2 tablets (100 mg total) by mouth 2 (two) times daily. 120 tablet 5   fluticasone (FLONASE) 50 MCG/ACT nasal spray Place 2 sprays into both nostrils daily as needed for rhinitis or allergies.     No facility-administered medications prior to visit.   Allergies  Allergen Reactions   Celebrex [Celecoxib] Hives and Itching     Objective:   Today's Vitals   07/20/20 1111  BP: 130/70  Pulse: 67  Temp: (!) 97.3 F (36.3 C)  TempSrc: Temporal  SpO2: 95%  Weight: 170 lb (77.1 kg)  Height: 5\' 8"  (1.727 m)   Body mass index is 25.85 kg/m.   General: Well developed, well nourished. No acute distress. HEENT: Normocephalic, non-traumatic. External ears normal. EAC and TMs normal bilaterally.    Nose clear without congestion or rhinorrhea. Mucous membranes moist. Oropharynx clear.    The palate does not move equally. Good dentition. Neck: Supple. No lymphadenopathy. No thyromegaly. There are tight muscles in the left neck. Lungs: Initial wheezing/ronchi that cleared  with coughing. Extremities: Full ROM of right elbow without sign of effusion. No joint swelling or tenderness. Psych: Alert and oriented. Normal mood and affect.  Health Maintenance Due  Topic Date Due   Zoster Vaccines- Shingrix (1 of 2) Never done   COVID-19 Vaccine (4 - Booster for [employer] series) 02/16/2020  COLONOSCOPY (Pts 45-40yrs Insurance coverage will need to be confirmed)  03/22/2020     Imaging: Right elbow x-ray (06/30/2020) IMPRESSION: No definite acute osseous abnormality, however elbow effusion is present. There are moderate degenerative changes.  CT of right elbow w/o contrast 07/01/2020) IMPRESSION: Cortical irregularity along the supracondylar humerus suggestive of a nondisplaced fracture. Associated elbow effusion.  MRI of forearm w, w/o contrast (07/01/2020) IMPRESSION: 1. Motion degraded exam. 2. Large elbow joint effusion with synovial enhancement. Findings are nonspecific and may be related to inflammatory or infectious arthropathy. Arthrocentesis is recommended for further evaluation. 3. Mild multicompartment intramuscular edema at the level of the elbow. Findings can be seen in the setting of myositis. No appreciable intramuscular fluid collection. 4. No evidence of acute osseous abnormality involving the elbow or proximal forearm. Specifically, no evidence to suggest supracondylar fracture of the distal humerus as suggested on recent CT.  Assessment & Plan:  1. Chronic rhinitis Brandon [name] appears to have a chronic rhinitis, which may be in part related to previous radiation therapy for his tonsillar cancer. I recommended he continue to use his saline nasal washes. He was previously prescribed Flonase. I urged him to use 2 sprays ine ach nostril daily to reduce swelling and hopefully improve his ear equalization.  - fluticasone (FLONASE) 50 MCG/ACT nasal spray; Place 2 sprays into both nostrils daily as needed for rhinitis or allergies.  Dispense: 11.1 mL;  Refill: 3  2. Chronic obstructive pulmonary disease, unspecified COPD type ([place]) I suspect Brandon [name] has at least GOLD I COPD in light of his past smoking history. I recommend we start him on a daily tiotropium and have him use albuterol as needed. I will follow-up with him in 3 months to reassess. Would consider PFTS in the future, if not responding.  - Tiotropium Bromide Monohydrate (SPIRIVA RESPIMAT) 2.5 MCG/ACT AERS; Inhale 2 puffs into the lungs daily.  Dispense: 4 g; Refill: 3 - albuterol (VENTOLIN HFA) 108 (90 Base) MCG/ACT inhaler; Inhale 2 puffs into the lungs every 6 (six) hours as needed for wheezing or shortness of breath.  Dispense: 8 g; Refill: 2  3. History of placement of stent in LAD coronary artery Doing well at this point without angina.  4. S/P TAVR (transcatheter aortic valve replacement) Patient recently feeling more energy and seems to be doing well since his valve replacement.  5. Gout of right elbow, unspecified cause, unspecified chronicity I recommend we check a uric acid level. If elevated, I will plan to prescribe allopurinol to reduce risk for recurrent episodes.  - Uric acid  Haydee Salter, MD

## 2020-07-21 ENCOUNTER — Telehealth (HOSPITAL_COMMUNITY): Payer: Self-pay | Admitting: Physician Assistant

## 2020-07-21 DIAGNOSIS — M85852 Other specified disorders of bone density and structure, left thigh: Secondary | ICD-10-CM | POA: Diagnosis not present

## 2020-07-21 DIAGNOSIS — M85851 Other specified disorders of bone density and structure, right thigh: Secondary | ICD-10-CM | POA: Diagnosis not present

## 2020-07-21 NOTE — Telephone Encounter (Signed)
Just an FYI. We have made several attempts to contact this patient including sending a letter to schedule or reschedule their echocardiogram. We will be removing the patient from the echo WQ.   07/08/20 NO SHOWED-MAILED LETTER [facility]    Thank you

## 2020-07-22 ENCOUNTER — Telehealth: Payer: Self-pay | Admitting: *Deleted

## 2020-07-22 NOTE — Telephone Encounter (Signed)
Received DEXA results from [facility].  Date of Scan: 07/21/2020 Lowest T-score and lowest site measured:-2.2 Left Femoral Neck Significant changes in BMD and site measured (5% and above):n/a  Current Regimen:none  Recommendation: calcium, Vitamin D, resistive exercises. Repeat DEXA in 2 years.   Reviewed by:Hazel Sams, PA-C  Next Appointment:  12/05/2020  Attempted to contact the patient and left message to advise patient to call the office.

## 2020-07-25 ENCOUNTER — Encounter
Payer: Medicare Other | Attending: Physical Medicine and Rehabilitation | Admitting: Physical Medicine and Rehabilitation

## 2020-07-25 ENCOUNTER — Encounter: Payer: Self-pay | Admitting: Physical Medicine and Rehabilitation

## 2020-07-25 ENCOUNTER — Other Ambulatory Visit: Payer: Self-pay

## 2020-07-25 VITALS — BP 133/72 | HR 73 | Temp 97.5°F | Ht 68.0 in | Wt 169.4 lb

## 2020-07-25 DIAGNOSIS — M353 Polymyalgia rheumatica: Secondary | ICD-10-CM | POA: Insufficient documentation

## 2020-07-25 DIAGNOSIS — F419 Anxiety disorder, unspecified: Secondary | ICD-10-CM | POA: Diagnosis not present

## 2020-07-25 DIAGNOSIS — G894 Chronic pain syndrome: Secondary | ICD-10-CM | POA: Diagnosis not present

## 2020-07-25 DIAGNOSIS — M25512 Pain in left shoulder: Secondary | ICD-10-CM | POA: Diagnosis not present

## 2020-07-25 DIAGNOSIS — M25511 Pain in right shoulder: Secondary | ICD-10-CM | POA: Diagnosis not present

## 2020-07-25 DIAGNOSIS — F32A Depression, unspecified: Secondary | ICD-10-CM | POA: Diagnosis not present

## 2020-07-25 NOTE — Patient Instructions (Signed)
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   Chronic pain- suggest speaking to Rheumatology about moving Prednisone to 7.5 mg daily?  Will con't Oxycodone  at current dose- just refilled 07/11/20.  Restless leg syndrome- con't tramadol 100 mg BID- last Rx given 03/16/20- with 5 refills- is due to be refilled in August-  Con't Gabapentin for nerve pain 300 mg TID- was last refilled 02/08/20- - has enough-  Still ON Celexa 20 mg daily- has really helped- calmer- last refilled 04/25/20- due for refill in August 2022.  F/u in 6 months

## 2020-07-25 NOTE — Progress Notes (Signed)
Subjective:    Patient ID: Brandon [name], male    DOB: 21-Apr-1940, 80 y.o.   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   Had valve replaced 3.5 weeks ago-  Doing great! Felt weird for a few weeks- back to doing great 1 week ago- more energy/feels comfortable.  Sees Cards tomorrow- Dr Eliot Ford.  Stent placed before that.   Woke up Friday Am- messed up R elbow- just started hurting- couldn't turn it, etc.  Went to [facility]- MRI, CT, and Xray- had to take fluid out of R elbow.  Was GOUT! So didn't take Colchicine and alter eating patterns- was told can't "eat anything".  A lot more arthritis pain in hands and joints- 7 mg of prednisone/day.  Seeing Rheum in 1-2 weeks- wants them to raise prednisone slightly if possible.  Did bone density exam-results aren't back.      Pain Inventory Average Pain 4 Pain Right Now 4 My pain is dull and aching  In the last 24 hours, has pain interfered with the following? General activity 6 Relation with others 6 Enjoyment of life 6 What TIME of day is your pain at its worst? varies Sleep (in general) Good  Pain is worse with: inactivity and standing Pain improves with: therapy/exercise and medication Relief from Meds: 7  Family History  Problem Relation Age of Onset   Heart disease Father 39       Living   Coronary artery disease Father        CABG   Alzheimer's disease Mother 44       Deceased   Arthritis Mother    Aneurysm Brother    Stomach cancer Maternal Uncle    Brain cancer Maternal Aunt        x2   Obesity Daughter        Had Bypass Sx   Social History   Socioeconomic History   Marital status: Married    Spouse name: etta    Number of children: 2   Years of education: Not on file   Highest education level: Not on file  Occupational History   Occupation: retired    Fish farm manager: RETIRED    Comment: disabled due to back problems  Tobacco Use   Smoking status: Former    Packs/day: 1.50    Years: 50.00    Pack years: 75.00    Types: Cigarettes    Quit date: 09/05/2017    Years since quitting: 2.8   Smokeless tobacco: Never   Tobacco comments:    smoked less than 1 ppd for 40+ years; Using Vapor Cig  Vaping Use   Vaping Use: Never used  Substance and Sexual Activity   Alcohol use: Yes    Alcohol/week: 0.0 standard drinks    Comment: Occasionally,not a heavy drinker    Drug use: Yes    Types: Oxycodone   Sexual activity: Never  Other Topics Concern   Not on file  Social History Narrative   ** Merged History Encounter **       Married (3rd), [place]. 2 children from 1st marriage, 4 step children.    Retired  on disability due to back    Former Engineer, mining.   restores antique furniture for a hobby.       Cell # [phone]   Social Determinants of Health   Financial Resource Strain: Low Risk    Difficulty of Paying Living Expenses: Not hard at all  Food Insecurity: No Food Insecurity   Worried About [place] in the Last Year: Never true   [place] in the Last Year: Never true  Transportation Needs: No Transportation Needs   Lack of Transportation (Medical): No   Lack of Transportation (Non-Medical): No  Physical Activity: Inactive   Days of Exercise per Week: 0 days   Minutes of Exercise per Session: 0 min  Stress: No Stress Concern Present   Feeling of Stress : Not at all  Social Connections: Moderately Isolated   Frequency of Communication with Friends and Family: More than three times a week   Frequency of Social Gatherings with Friends and Family: Once a week   Attends Religious Services: Never   Marine scientist or Organizations: No   Attends  Music therapist: Never   Marital Status: Married   Past Surgical History:  Procedure Laterality Date   [place], BILATERAL Bilateral 01/2012 - 02/2012   CORONARY ATHERECTOMY N/A 04/27/2020   Procedure: CORONARY ATHERECTOMY;  Surgeon: Sherren Mocha, MD;  Location: [place] [facility];  Service: Cardiovascular;  Laterality: N/A;   CORONARY STENT INTERVENTION N/A 04/27/2020   Procedure: CORONARY STENT INTERVENTION;  Surgeon: Sherren Mocha, MD;  Location: [place] [facility];  Service: Cardiovascular;  Laterality: N/A;   DIRECT LARYNGOSCOPY Left 10/09/2017   Procedure: DIRECT LARYNGOSCOPY WITH BOPSY;  Surgeon: Jodi Marble, MD;  Location: [place];  Service: ENT;  Laterality: Left;   ESOPHAGOGASTRODUODENOSCOPY (EGD) WITH ESOPHAGEAL DILATION     "couple times" (07/19/2016)   ESOPHAGOSCOPY Left 10/09/2017   Procedure: ESOPHAGOSCOPY;  Surgeon: Jodi Marble, MD;  Location: [place];  Service: ENT;  Laterality: Left;   INTRAVASCULAR IMAGING/OCT N/A 04/27/2020   Procedure: INTRAVASCULAR IMAGING/OCT;  Surgeon: Sherren Mocha, MD;  Location: [place] [facility];  Service: Cardiovascular;  Laterality: N/A;   INTRAVASCULAR PRESSURE WIRE/FFR STUDY N/A 04/27/2020   Procedure: INTRAVASCULAR PRESSURE WIRE/FFR STUDY;  Surgeon: Sherren Mocha, MD;  Location: [place] [facility];  Service: Cardiovascular;  Laterality: N/A;   IR GASTROSTOMY TUBE MOD SED  01/08/2018   IR THORACENTESIS ASP PLEURAL SPACE W/IMG GUIDE  07/19/2016   LAPAROSCOPIC CHOLECYSTECTOMY  1994   LUMBAR [place] SURGERY  05/1996   L4-5; Dr. Claudean Kinds LAMINECTOMY/DECOMPRESSION MICRODISCECTOMY  10/2002   L3-4. Dr. Sherwood Gambler   MULTIPLE TOOTH EXTRACTIONS  1980s   PARTIAL GLOSSECTOMY  12/02/2017   Dr. [facility]   pharyngoplasty for closure of tingue base defect  12/02/2017   Dr. Nicolette Bang- [place]  07/15/2016   POSTERIOR LUMBAR FUSION  09/1996   Ray cage, L4-5 Dr. Rita Ohara   PROSTATE BIOPSY  ~ 2017   radical tonsillectomy Left 12/02/2017   Dr. Nicolette Bang at [place] N/A 03/31/2020   Procedure: RIGHT HEART CATH AND CORONARY ANGIOGRAPHY;  Surgeon: Larey Dresser, MD;  Location: [place] [facility];  Service: Cardiovascular;  Laterality: N/A;   RIGID BRONCHOSCOPY Left 10/09/2017  Procedure: RIGID BRONCHOSCOPY;  Surgeon: Jodi Marble, MD;  Location: [place];  Service: ENT;  Laterality: Left;   TEE WITHOUT CARDIOVERSION N/A 06/07/2020   Procedure: TRANSESOPHAGEAL ECHOCARDIOGRAM (TEE);  Surgeon: Sherren Mocha, MD;  Location: [place];  Service: Open Heart Surgery;  Laterality: N/A;   TONSILLECTOMY     TRACHEOSTOMY  12/02/2017   Dr. Nicolette Bang- [facility]   ULTRASOUND GUIDANCE FOR VASCULAR ACCESS Right 06/07/2020   Procedure: ULTRASOUND GUIDANCE FOR VASCULAR ACCESS;  Surgeon: Sherren Mocha, MD;  Location: [place];  Service: Open Heart Surgery;  Laterality: Right;   VASCULAR SURGERY     Past Surgical History:  Procedure Laterality Date   BACK SURGERY     CARDIAC CATHETERIZATION     CATARACT EXTRACTION W/ INTRAOCULAR LENS  IMPLANT, BILATERAL Bilateral 01/2012 - 02/2012   CORONARY ATHERECTOMY N/A 04/27/2020   Procedure: CORONARY ATHERECTOMY;  Surgeon: Sherren Mocha, MD;  Location: [place] [facility];  Service: Cardiovascular;  Laterality: N/A;   CORONARY STENT INTERVENTION N/A 04/27/2020   Procedure: CORONARY STENT INTERVENTION;  Surgeon: Sherren Mocha, MD;  Location: [place] [facility];  Service: Cardiovascular;  Laterality: N/A;   DIRECT LARYNGOSCOPY Left 10/09/2017   Procedure: DIRECT LARYNGOSCOPY WITH BOPSY;  Surgeon: Jodi Marble, MD;  Location: [place];  Service: ENT;  Laterality: Left;   ESOPHAGOGASTRODUODENOSCOPY (EGD) WITH ESOPHAGEAL DILATION     "couple times" (07/19/2016)   ESOPHAGOSCOPY Left 10/09/2017    Procedure: ESOPHAGOSCOPY;  Surgeon: Jodi Marble, MD;  Location: [place];  Service: ENT;  Laterality: Left;   INTRAVASCULAR IMAGING/OCT N/A 04/27/2020   Procedure: INTRAVASCULAR IMAGING/OCT;  Surgeon: Sherren Mocha, MD;  Location: [place] [facility];  Service: Cardiovascular;  Laterality: N/A;   INTRAVASCULAR PRESSURE WIRE/FFR STUDY N/A 04/27/2020   Procedure: INTRAVASCULAR PRESSURE WIRE/FFR STUDY;  Surgeon: Sherren Mocha, MD;  Location: [place] [facility];  Service: Cardiovascular;  Laterality: N/A;   IR GASTROSTOMY TUBE MOD SED  01/08/2018   IR THORACENTESIS ASP PLEURAL SPACE W/IMG GUIDE  07/19/2016   LAPAROSCOPIC CHOLECYSTECTOMY  1994   LUMBAR [place] SURGERY  05/1996   L4-5; Dr. Claudean Kinds LAMINECTOMY/DECOMPRESSION MICRODISCECTOMY  10/2002   L3-4. Dr. Sherwood Gambler   MULTIPLE TOOTH EXTRACTIONS  1980s   PARTIAL GLOSSECTOMY  12/02/2017   Dr. Nicolette Bang- [facility]   pharyngoplasty for closure of tingue base defect  12/02/2017   Dr. Nicolette Bang- [place]  07/15/2016   POSTERIOR LUMBAR FUSION  09/1996   Ray cage, L4-5 Dr. Rita Ohara   PROSTATE BIOPSY  ~ 2017   radical tonsillectomy Left 12/02/2017   Dr. Nicolette Bang at [place] N/A 03/31/2020   Procedure: RIGHT HEART CATH AND CORONARY ANGIOGRAPHY;  Surgeon: Larey Dresser, MD;  Location: [place] [facility];  Service: Cardiovascular;  Laterality: N/A;   RIGID BRONCHOSCOPY Left 10/09/2017   Procedure: RIGID BRONCHOSCOPY;  Surgeon: Jodi Marble, MD;  Location: [place];  Service: ENT;  Laterality: Left;   TEE WITHOUT CARDIOVERSION N/A 06/07/2020   Procedure: TRANSESOPHAGEAL ECHOCARDIOGRAM (TEE);  Surgeon: Sherren Mocha, MD;  Location: [place];  Service: Open Heart Surgery;  Laterality: N/A;   TONSILLECTOMY     TRACHEOSTOMY  12/02/2017   Dr. Nicolette Bang- [facility]   ULTRASOUND GUIDANCE FOR VASCULAR ACCESS Right 06/07/2020   Procedure: ULTRASOUND GUIDANCE FOR VASCULAR  ACCESS;  Surgeon: Sherren Mocha, MD;  Location: [place];  Service: Open Heart Surgery;  Laterality: Right;  VASCULAR SURGERY     Past Medical History:  Diagnosis Date   Abdominal aneurysm ([place])    Arthritis    "all over" (07/19/2016)   BPH (benign prostatic hypertrophy)    CAD (coronary artery disease)    Chicken pox    Chronic lower back pain    s/p surgical fusion   Depression    Diverticulosis    Esophageal stricture    GERD (gastroesophageal reflux disease)    Hepatitis B 1984   Hiatal hernia    History of radiation therapy 01/16/18- 03/05/18   Left Tonsil, 66 Gy in 33 fractions to high risk nodal echelons.    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 Burt Knack and Dr Cyndia Bent    Severe aortic stenosis    TIA (transient ischemic attack) 1990s   hx   tonsillar ca dx'd 11/2017   Tubular adenoma of colon 2009   BP 133/72 (BP Location: Left Arm, Patient Position: Sitting, Cuff Size: Normal)   Pulse 73   Temp (!) 97.5 F (36.4 C) (Oral)   Ht 5\' 8"  (1.727 m)   Wt 169 lb 6.4 oz (76.8 kg)   SpO2 94%   BMI 25.76 kg/m   Opioid Risk Score:   Fall Risk Score:  `1  Depression screen PHQ 2/9  Depression screen [facility] 2/9 02/15/2020 02/08/2020 09/29/2019 09/23/2019 07/29/2019 05/27/2019 04/24/2019  Decreased Interest 0 1 0 0 1 1 1   Down, Depressed, Hopeless 1 1 0 0 0 0 0  PHQ - 2 Score 1 2 0 0 1 1 1   Altered sleeping - - 0 - - - 1  Tired, decreased energy - - 1 - - - 1  Change in appetite - - 0 - - - 0  Feeling bad or failure about yourself  - - 0 - - - 1  Trouble concentrating - - 0 - - - 0  Moving slowly or fidgety/restless - - 0 - - - 0  Suicidal thoughts - - 0 - - - 0  PHQ-9 Score - - 1 - - - 4  Difficult doing work/chores - - - - - - -  Some recent data might be hidden     Review of Systems   Musculoskeletal:  Positive for back pain.       Shoulder pain Hand pain Leg pain Pelvic pain  All other systems reviewed and are negative.     Objective:   Physical Exam  Awake, alert, appropriate, NAD Ecchymoses over arms B/L- esp big spot at R elbow.  R elbow not swollen/no effusion seen Hands slightly stiff- esp MCPs- no gout seen on any joints. R hand more stiff and decreased ROM of Fingers vs Left.  Went through L chest for TAVR- scar- already healed.       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   Chronic pain- suggest speaking to Rheumatology about moving Prednisone to 7.5 mg daily?  Will con't Oxycodone  at current dose- just refilled 07/11/20.  Restless leg syndrome- con't tramadol 100 mg BID- last Rx given 03/16/20- with 5 refills- is due to be refilled in August-  Con't Gabapentin for nerve pain 300 mg TID- was last refilled 02/08/20- - has enough-  Still ON Celexa 20 mg daily- has really helped- calmer- last refilled 04/25/20- due for refill in August 2022.  F/u in 6 months

## 2020-07-26 ENCOUNTER — Ambulatory Visit (HOSPITAL_COMMUNITY)
Admission: RE | Admit: 2020-07-26 | Discharge: 2020-07-26 | Disposition: A | Discharge status: Home / Self Care | Payer: Medicare Other | Source: Ambulatory Visit | Attending: Family Medicine | Admitting: Family Medicine

## 2020-07-26 DIAGNOSIS — E7849 Other hyperlipidemia: Secondary | ICD-10-CM | POA: Diagnosis not present

## 2020-07-26 LAB — HEPATIC FUNCTION PANEL
ALT: 17 U/L (ref 0–44)
AST: 19 U/L (ref 15–41)
Albumin: 3.5 g/dL (ref 3.5–5.0)
Alkaline Phosphatase: 121 U/L (ref 38–126)
Bilirubin, Direct: 0.1 mg/dL (ref 0.0–0.2)
Indirect Bilirubin: 0.5 mg/dL (ref 0.3–0.9)
Total Bilirubin: 0.6 mg/dL (ref 0.3–1.2)
Total Protein: 6.4 g/dL — ABNORMAL LOW (ref 6.5–8.1)

## 2020-07-26 LAB — LIPID PANEL
Cholesterol: 156 mg/dL (ref 0–200)
HDL: 67 mg/dL (ref >40)
LDL Cholesterol: 77 mg/dL (ref 0–99)
Total CHOL/HDL Ratio: 2.3 RATIO
Triglycerides: 61 mg/dL (ref <150)
VLDL: 12 mg/dL (ref 0–40)

## 2020-07-27 DIAGNOSIS — N401 Enlarged prostate with lower urinary tract symptoms: Secondary | ICD-10-CM | POA: Diagnosis not present

## 2020-07-27 DIAGNOSIS — R350 Frequency of micturition: Secondary | ICD-10-CM | POA: Diagnosis not present

## 2020-08-01 ENCOUNTER — Other Ambulatory Visit (HOSPITAL_COMMUNITY): Payer: Self-pay | Admitting: Cardiology

## 2020-08-06 NOTE — Progress Notes (Signed)
[facility] [place]                                     Cardiology Office Note:    Date:  08/10/2020   ID:  Brandon [name], DOB 11/24/1940, MRN [id number]  PCP:  Haydee Salter, MD  [facility] Cardiologist:  Loralie Champagne, MD / Dr. Burt Knack & Dr. Cyndia Bent (TAVR)  [facility] Electrophysiologist:  None   Referring MD: Haydee Salter, MD   1 month s/p TAVR  History of Present Illness:    Brandon [name] is a 80 y.o. 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   The patient has been followed for aortic stenosis with surveillance echo studies and close clinical care by Dr. Aundra Dubin.  He has developed progressive and now severe aortic stenosis with mean gradient of 50 mmHg and calculated valve area of 0.96 cm.  LVEF remains preserved at 60 to 65%.  He underwent cardiac catheterization for preoperative assessment and was demonstrated to have chronic occlusion of the right coronary artery with left-to-right collaterals, severe calcific proximal LAD stenosis, and moderately severe long segment mid circumflex stenosis. The patient reported exertional chest pain and dyspnea. He was referred to the multidisciplinary valve team for consideration of SAVR+CABG or TAVR+PCI. Cardiac gated CTA of the heart revealed anatomical characteristics consistent with aortic stenosis suitable for treatment by transcatheter aortic valve replacement without any significant complicating features. CTA of the aorta and iliac vessels demonstrated a large infrarenal abdominal aortic  aneurysm which is increasing in size, currently measuring 5.0x 4.2 cm in diameter (previously 4.3 cm on 02/26/2017). Seen by Dr. Donzetta Matters with vascular who recommended repeat CT scan in 5-6 months and proceed with TAVR. He felt he may be a candidate for endovascular repair in the future.       He was evaluated by the multidisciplinary valve team inlcuding Dr. Burt Knack and Dr Cyndia Bent who felt PCI+TAVR via the subclavian approach was the most appropriate course of action for the patient. He underwent successful orbital atherectomy and stenting of severe calcific stenosis in the proximal LAD, procedure guided by OCT imaging, treated with a 4.0 x 12 mm resolute Onyx DES on 04/27/20. There was moderate left circumflex stenosis with negative pressure wire analysis. He was continued on uninterrupted DAPT with aspirin and Plavix x 6 months.     He underwent successful TAVR with a 29 mm Edwards Sapien 3 THV via the subclavian approach on 06/07/20. Post operative echo showed EF 60%, normally functioning TAVR with a mean gradient of 8 mmHg and no PVL. He was continued on DAPT with aspirin and plavix. He was supplemented with potassium due to hypokalemia. He did have some shoulder pain after the procedure for which he had taken some extra pain pills. In follow up he was depressed due to increased pain and possible PMR flare. After discussion with his pain medicine Dr, it was decided to call him in extra pain medication.   Today he presents to clinic  for follow up. He is feeling great. No CP or SOB. No LE edema, orthopnea or PND. No dizziness or syncope. No blood in stool or urine. No palpitations.    Past Medical History:  Diagnosis Date   Abdominal aneurysm ([place])    Arthritis    "all over" (07/19/2016)   BPH (benign prostatic hypertrophy)    CAD (coronary artery disease)    Chicken pox    Chronic lower back pain    s/p surgical fusion   Depression    Diverticulosis    Esophageal stricture    GERD (gastroesophageal  reflux disease)    Hepatitis B 1984   Hiatal hernia    History of radiation therapy 01/16/18- 03/05/18   Left Tonsil, 66 Gy in 33 fractions to high risk nodal echelons.    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 Burt Knack and Dr Cyndia Bent    Severe aortic stenosis    TIA (transient ischemic attack) 1990s   hx   tonsillar ca dx'd 11/2017   Tubular adenoma of colon 2009    Past Surgical History:  Procedure Laterality Date   BACK SURGERY     CARDIAC CATHETERIZATION     CATARACT EXTRACTION W/ INTRAOCULAR LENS  IMPLANT, BILATERAL Bilateral 01/2012 - 02/2012   CORONARY ATHERECTOMY N/A 04/27/2020   Procedure: CORONARY ATHERECTOMY;  Surgeon: Sherren Mocha, MD;  Location: [place] [facility];  Service: Cardiovascular;  Laterality: N/A;   CORONARY STENT INTERVENTION N/A 04/27/2020   Procedure: CORONARY STENT INTERVENTION;  Surgeon: Sherren Mocha, MD;  Location: [place] [facility];  Service: Cardiovascular;  Laterality: N/A;   DIRECT LARYNGOSCOPY Left 10/09/2017   Procedure: DIRECT LARYNGOSCOPY WITH BOPSY;  Surgeon: Jodi Marble, MD;  Location: [place];  Service: ENT;  Laterality: Left;   ESOPHAGOGASTRODUODENOSCOPY (EGD) WITH ESOPHAGEAL DILATION     "couple times" (07/19/2016)   ESOPHAGOSCOPY Left 10/09/2017   Procedure: ESOPHAGOSCOPY;  Surgeon: Jodi Marble, MD;  Location: [place];  Service: ENT;  Laterality: Left;   INTRAVASCULAR IMAGING/OCT N/A 04/27/2020   Procedure: INTRAVASCULAR IMAGING/OCT;  Surgeon: Sherren Mocha, MD;  Location: [place] [facility];  Service: Cardiovascular;  Laterality: N/A;   INTRAVASCULAR PRESSURE WIRE/FFR STUDY N/A 04/27/2020   Procedure: INTRAVASCULAR PRESSURE WIRE/FFR STUDY;  Surgeon: Sherren Mocha, MD;  Location: [place] [facility];  Service:  Cardiovascular;  Laterality: N/A;   IR GASTROSTOMY TUBE MOD SED  01/08/2018   IR THORACENTESIS ASP PLEURAL SPACE W/IMG GUIDE  07/19/2016   LAPAROSCOPIC CHOLECYSTECTOMY  1994   LUMBAR [place] SURGERY  05/1996   L4-5; Dr. Claudean Kinds LAMINECTOMY/DECOMPRESSION MICRODISCECTOMY  10/2002   L3-4. Dr. Sherwood Gambler   MULTIPLE TOOTH EXTRACTIONS  1980s   PARTIAL GLOSSECTOMY  12/02/2017   Dr. Nicolette [facility]- [facility]   pharyngoplasty for closure of tingue base defect  12/02/2017   Dr. Nicolette Bang- [place]  07/15/2016   POSTERIOR LUMBAR FUSION  09/1996   Ray cage, L4-5 Dr. Rita Ohara   PROSTATE BIOPSY  ~ 2017   radical tonsillectomy Left 12/02/2017   Dr. Nicolette Bang at [place] N/A 03/31/2020   Procedure: RIGHT HEART CATH AND CORONARY ANGIOGRAPHY;  Surgeon: Larey Dresser, MD;  Location: [place] [facility];  Service:  Cardiovascular;  Laterality: N/A;   RIGID BRONCHOSCOPY Left 10/09/2017   Procedure: RIGID BRONCHOSCOPY;  Surgeon: Jodi Marble, MD;  Location: [place];  Service: ENT;  Laterality: Left;   TEE WITHOUT CARDIOVERSION N/A 06/07/2020   Procedure: TRANSESOPHAGEAL ECHOCARDIOGRAM (TEE);  Surgeon: Sherren Mocha, MD;  Location: [place];  Service: Open Heart Surgery;  Laterality: N/A;   TONSILLECTOMY     TRACHEOSTOMY  12/02/2017   Dr. Nicolette Bang- [facility]   ULTRASOUND GUIDANCE FOR VASCULAR ACCESS Right 06/07/2020   Procedure: ULTRASOUND GUIDANCE FOR VASCULAR ACCESS;  Surgeon: Sherren Mocha, MD;  Location: [place];  Service: Open Heart Surgery;  Laterality: Right;   VASCULAR SURGERY      Current Medications: Current Meds  Medication Sig   albuterol (VENTOLIN HFA) 108 (90 Base) MCG/ACT inhaler Inhale 2 puffs into the lungs every 6 (six) hours as needed for wheezing or shortness of breath.   amLODipine (NORVASC) 10 MG tablet TAKE 1 TABLET(10 MG) BY MOUTH DAILY   aspirin 81 MG EC tablet Take 81 mg by mouth at bedtime.   atorvastatin (LIPITOR) 80  MG tablet Take 1 tablet (80 mg total) by mouth daily.   carvedilol (COREG) 6.25 MG tablet Take 1 tablet (6.25 mg total) by mouth 2 (two) times daily.   citalopram (CELEXA) 20 MG tablet Take 1 tablet (20 mg total) by mouth daily.   ezetimibe (ZETIA) 10 MG tablet Take 1 tablet (10 mg total) by mouth daily.   fluticasone (FLONASE) 50 MCG/ACT nasal spray Place 2 sprays into both nostrils daily as needed for rhinitis or allergies.   gabapentin (NEURONTIN) 300 MG capsule TAKE 1 CAPSULE(300 MG) BY MOUTH THREE TIMES DAILY   isosorbide mononitrate (IMDUR) 30 MG 24 hr tablet TAKE 1 TABLET(30 MG) BY MOUTH AT BEDTIME   lisinopril (ZESTRIL) 10 MG tablet TAKE 1 TABLET(10 MG) BY MOUTH DAILY   oxyCODONE (OXY IR/ROXICODONE) 5 MG immediate release tablet Take 1-2 tablets (5-10 mg total) by mouth every 6 (six) hours as needed for severe pain (to take 2 tabs ONLY when severe pain- which is not usual for pt).   pantoprazole (PROTONIX) 40 MG tablet Take 1 tablet (40 mg total) by mouth daily.   predniSONE (DELTASONE) 1 MG tablet Take 2 tablets p.o. every morning   predniSONE (DELTASONE) 5 MG tablet TAKE 1 TABLET(5 MG) BY MOUTH DAILY WITH BREAKFAST   tamsulosin (FLOMAX) 0.4 MG CAPS capsule Take 0.4 mg by mouth at bedtime.   Tiotropium Bromide Monohydrate (SPIRIVA RESPIMAT) 2.5 MCG/ACT AERS Inhale 2 puffs into the lungs daily.   traMADol (ULTRAM) 50 MG tablet Take 2 tablets (100 mg total) by mouth 2 (two) times daily.   [DISCONTINUED] clopidogrel (PLAVIX) 75 MG tablet Take 1 tablet (75 mg total) by mouth daily.     Allergies:   Celebrex [celecoxib]   Social History   Socioeconomic History   Marital status: Married    Spouse name: etta   Number of children: 2   Years of education: Not on file   Highest education level: Not on file  Occupational History   Occupation: retired    Fish farm manager: RETIRED    Comment: disabled due to back problems  Tobacco Use   Smoking status: Former    Packs/day: 1.50    Years: 50.00     Pack years: 75.00    Types: Cigarettes    Quit date: 09/05/2017    Years since quitting: 2.9   Smokeless tobacco: Never   Tobacco comments:  smoked less than 1 ppd for 40+ years; Using Vapor Cig  Vaping Use   Vaping Use: Never used  Substance and Sexual Activity   Alcohol use: Yes    Alcohol/week: 0.0 standard drinks    Comment: Occasionally,not a heavy drinker    Drug use: Yes    Types: Oxycodone   Sexual activity: Never  Other Topics Concern   Not on file  Social History Narrative   ** Merged History Encounter **       Married (3rd), [place]. 2 children from 1st marriage, 4 step children.    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       Cell # [phone]   Social Determinants of Health   Financial Resource Strain: Low Risk    Difficulty of Paying Living Expenses: Not hard at all  Food Insecurity: No Food Insecurity   Worried About [place] in the Last Year: Never true   [place] in the Last Year: Never true  Transportation Needs: No Transportation Needs   Lack of Transportation (Medical): No   Lack of Transportation (Non-Medical): No  Physical Activity: Inactive   Days of Exercise per Week: 0 days   Minutes of Exercise per Session: 0 min  Stress: No Stress Concern Present   Feeling of Stress : Not at all  Social Connections: Moderately Isolated   Frequency of Communication with Friends and Family: More than three times a week   Frequency of Social Gatherings with Friends and Family: Once a week   Attends Religious Services: Never   Marine scientist or Organizations: No   Attends Music therapist: Never   Marital Status: Married     Family History: The patient's family history includes Alzheimer's disease (age of onset: 102) in his mother; Aneurysm in his brother; Arthritis in his mother; Brain cancer in his maternal aunt; Coronary artery  disease in his father; Heart disease (age of onset: 32) in his father; Obesity in his daughter; Stomach cancer in his maternal uncle.  ROS:   Please see the history of present illness.    All other systems reviewed and are negative.  EKGs/Labs/Other Studies Reviewed:    The following studies were reviewed today:   TAVR OPERATIVE NOTE     Date of Procedure:                06/07/2020   Preoperative Diagnosis:      Severe Aortic Stenosis   Postoperative Diagnosis:    Same   Procedure:        Transcatheter Aortic Valve Replacement - Left Subclavian Artery Approach             Edwards Sapien 3 THV (size 29 mm, model # 9600TFX, serial # [id number])              Co-Surgeons:                        Gaye Pollack, MD and Sherren Mocha, MD   Anesthesiologist:                  Roberts Gaudy, MD   Echocardiographer:              Jenkins Rouge, MD   Pre-operative Echo Findings: Severe aortic stenosis, Mild aortic insufficiency Normal left ventricular systolic function   Post-operative  Echo Findings: Trivial paravalvular leak Normal left ventricular systolic function   _____________   Echo 06/08/20: IMPRESSIONS   1. Left ventricular ejection fraction, by estimation, is 60 to 65%. The  left ventricle has normal function. The left ventricle has no regional  wall motion abnormalities. There is severe asymmetric left ventricular  hypertrophy of the basal and septal segments. Left ventricular diastolic parameters were normal.   2. Right ventricular systolic function is normal. The right ventricular  size is normal.   3. Left atrial size was moderately dilated.   4. The mitral valve is abnormal. Mild mitral valve regurgitation. No  evidence of mitral stenosis.   5. Post TAVR with 29 mm Sapien 3 valve no significant PVL mean gradient 8 peak 17 mmHg AVA 5.6 cm2 DVI 0.91 . The aortic valve is normal in  structure. Aortic valve regurgitation is not visualized. No aortic  stenosis is present.    6. Aortic dilatation noted. There is mild dilatation of the ascending  aorta, measuring 41 mm.   7. The inferior vena cava is normal in size with greater than 50%  respiratory variability, suggesting right atrial pressure of 3 mmHg.  _________________________  Echo 08/10/20 IMPRESSIONS   1. 29 mm S3 in the aortic position. Vmax 2.6 m/s, MG 16 mmHG, EOA 2.90 cm2, DI 0.51. There is mild paravalvular leak in the 5-7 o'clock position. Gradient has increased since last echo. Higher gradient could be related to hyperdynamic LV function, but would consider gated CTA to exclude HALT. The aortic valve has been repaired/replaced. Aortic valve regurgitation is mild. There is a 29 mm Sapien prosthetic (TAVR) valve present in the aortic position. Procedure Date: 06/07/2020.   2. Hyperdynamic LV with intracavitary gradient noted up to 16 mmHG. Left  ventricular ejection fraction, by estimation, is 70 to 75%. The left  ventricle has hyperdynamic function. The left ventricle has no regional  wall motion abnormalities. There is moderate concentric left ventricular hypertrophy. Left ventricular diastolic parameters are consistent with Grade II diastolic dysfunction (pseudonormalization). Elevated left ventricular end-diastolic pressure.  The E/e' is 20.7. The average left ventricular global longitudinal strain is -20.2 %. The global longitudinal strain is normal.   3. Right ventricular systolic function is normal. The right ventricular  size is normal. Tricuspid regurgitation signal is inadequate for assessing  PA pressure.   4. Left atrial size was moderately dilated.   5. The mitral valve is grossly normal. No evidence of mitral valve  regurgitation. No evidence of mitral stenosis.   6. Aortic dilatation noted. There is mild dilatation of the aortic root  and of the ascending aorta, measuring 40 mm.   7. The inferior vena cava is normal in size with greater than 50%  respiratory variability, suggesting right  atrial pressure of 3 mmHg.   Comparison(s): Changes from prior study are noted. Hyperdynamic LV  function. Gradients across TAVR have increased as described above.    EKG:  EKG is NOT ordered today.   Recent Labs: 03/25/2020: TSH 1.569 06/08/2020: Magnesium 1.8 07/01/2020: BUN 16; Creatinine, Ser 0.54; Hemoglobin 11.1; Platelets 139; Potassium 3.5; Sodium 138 07/26/2020: ALT 17  Recent Lipid Panel    Component Value Date/Time   CHOL 156 07/26/2020 1130   TRIG 61 07/26/2020 1130   HDL 67 07/26/2020 1130   CHOLHDL 2.3 07/26/2020 1130   VLDL 12 07/26/2020 1130   LDLCALC 77 07/26/2020 1130   LDLDIRECT 81.2 05/25/2008 0906     Risk Assessment/Calculations:  Physical Exam:    VS:  BP (!) 150/72   Pulse 69   Ht 5' 6.5" (1.689 m)   Wt 172 lb 9.6 oz (78.3 kg)   SpO2 96%   BMI 27.44 kg/m     Wt Readings from Last 3 Encounters:  08/10/20 172 lb 9.6 oz (78.3 kg)  07/25/20 169 lb 6.4 oz (76.8 kg)  07/20/20 170 lb (77.1 kg)     GEN:  Well nourished, well developed in no acute distress HEENT: Normal NECK: No JVD; No carotid bruits LYMPHATICS: No lymphadenopathy CARDIAC: RRR, no murmurs, rubs, gallops RESPIRATORY:  Clear to auscultation without rales, wheezing or rhonchi  ABDOMEN: Soft, non-tender, non-distended MUSCULOSKELETAL:  No edema; No deformity  SKIN: Warm and dry NEUROLOGIC:  Alert and oriented x 3 PSYCHIATRIC:  Normal affect   ASSESSMENT:    1. S/P TAVR (transcatheter aortic valve replacement)   2. Coronary artery disease involving native coronary artery of native heart without angina pectoris   3. Aneurysm of infrarenal abdominal aorta (HCC)   4. Essential hypertension   5. Hypokalemia   6. Polymyalgia rheumatica (HCC)    PLAN:    In order of problems listed above:  Severe AS s/p TAVR: echo today shows EF 70%, normally functioning TAVR with a mean gradient of 16 mm hg and mild PVL. Gradient has increased since last echo (up for 8 mm hg with an  estimated EF of 60-65%). Higher gradient could be related to hyperdynamic LV function, but would consider gated CTA to exclude HALT. Will plan to get a repeat echo in 3 months to follow gradients.. If gradients continue to climb, will get CT set up. Patient is doing quite well with NYHA class I symptoms. Continue aspirin and plavix. Will stop plavix on 10/28/20 ( 6 months out from PCI) due to significant bruising 4 months of DAPT after TAVR should be sufficient. SBE prophylaxis discussed; the patient is edentulous and does not go to the dentist. I will see him back in 1 year for follow up and echo.     CAD with angina: he underwent successful orbital atherectomy and stenting of severe calcific stenosis in the proximal LAD, procedure guided by OCT imaging, treated with a 4.0 x 12 mm resolute Onyx DES on 04/27/20. There was moderate left circumflex stenosis with negative pressure wire analysis. He will be continued on uninterrupted DAPT with aspirin and Plavix x 6 months. Continue statin, BB and nitrates. No chest pain.     AAA: pre TAVR CT showed a large infrarenal abdominal aortic aneurysm which is increasing in size, currently measuring 5.0x 4.2 cm in diameter (previously 4.3 cm on 02/26/2017). Followed by Dr. Donzetta Matters.     HTN: BP initially elevated but 130/80 on my personal recheck. No changes made.     Hypokalemia: resolved with supplementation   Polymyalgia rheumatica with chronic pain: recently seen by rheum     Medication Adjustments/Labs and Tests Ordered: Current medicines are reviewed at length with the patient today.  Concerns regarding medicines are outlined above.  No orders of the defined types were placed in this encounter.  Meds ordered this encounter  Medications   clopidogrel (PLAVIX) 75 MG tablet    Sig: Take 1 tablet (75 mg total) by mouth daily.    Dispense:  90 tablet    Refill:  3    No new refills needed at this time, changed to add stop date only     Patient Instructions   Medication Instructions:  1.) stop Plavix after 10/28/20  *If you need a refill on your cardiac medications before your next appointment, please call your pharmacy*   Lab Work: none   Testing/Procedures: none   Follow-Up: You will be seen back in one year for echo and follow up with  K. Grandville Silos, PA-C   Signed, Angelena Form, PA-C  08/10/2020 9:21 PM    [facility]

## 2020-08-09 ENCOUNTER — Other Ambulatory Visit: Payer: Self-pay | Admitting: Rheumatology

## 2020-08-09 NOTE — Telephone Encounter (Signed)
Patient scheduled an appointment to be seen on 08/11/2020 to evaluate increased pain and evaluate the dose of Prednisone. Will refuse this refill as he states he still has medication. He will have refill sent at his appointment.

## 2020-08-10 ENCOUNTER — Other Ambulatory Visit (HOSPITAL_COMMUNITY): Payer: Medicare Other

## 2020-08-10 ENCOUNTER — Ambulatory Visit (HOSPITAL_COMMUNITY): Payer: Medicare Other | Attending: Cardiovascular Disease

## 2020-08-10 ENCOUNTER — Ambulatory Visit (INDEPENDENT_AMBULATORY_CARE_PROVIDER_SITE_OTHER): Payer: Medicare Other | Admitting: Physician Assistant

## 2020-08-10 ENCOUNTER — Encounter: Payer: Self-pay | Admitting: Physician Assistant

## 2020-08-10 ENCOUNTER — Other Ambulatory Visit: Payer: Self-pay

## 2020-08-10 VITALS — BP 150/72 | HR 69 | Ht 66.5 in | Wt 172.6 lb

## 2020-08-10 DIAGNOSIS — M353 Polymyalgia rheumatica: Secondary | ICD-10-CM

## 2020-08-10 DIAGNOSIS — I2510 Atherosclerotic heart disease of native coronary artery without angina pectoris: Secondary | ICD-10-CM

## 2020-08-10 DIAGNOSIS — I10 Essential (primary) hypertension: Secondary | ICD-10-CM | POA: Diagnosis not present

## 2020-08-10 DIAGNOSIS — I714 Abdominal aortic aneurysm, without rupture: Secondary | ICD-10-CM

## 2020-08-10 DIAGNOSIS — Z952 Presence of prosthetic heart valve: Secondary | ICD-10-CM | POA: Insufficient documentation

## 2020-08-10 DIAGNOSIS — E876 Hypokalemia: Secondary | ICD-10-CM

## 2020-08-10 DIAGNOSIS — I7143 Infrarenal abdominal aortic aneurysm, without rupture: Secondary | ICD-10-CM

## 2020-08-10 LAB — ECHOCARDIOGRAM COMPLETE
AR max vel: 2.83 cm2
AV Area VTI: 2.9 cm2
AV Area mean vel: 2.79 cm2
AV Mean grad: 16 mmHg
AV Peak grad: 28.5 mmHg
Ao pk vel: 2.67 m/s
Area-P 1/2: 1.97 cm2
P 1/2 time: 329 msec
S' Lateral: 2.8 cm

## 2020-08-10 MED ORDER — CLOPIDOGREL BISULFATE 75 MG PO TABS: 75.0000 mg | ORAL_TABLET | Freq: Every day | ORAL | 3 refills | Status: AC

## 2020-08-10 NOTE — Patient Instructions (Signed)
Medication Instructions:  1.) stop Plavix after 10/28/20  *If you need a refill on your cardiac medications before your next appointment, please call your pharmacy*   Lab Work: none   Testing/Procedures: none   Follow-Up: You will be seen back in one year for echo and follow up with  K. Grandville Silos, PA-C

## 2020-08-10 NOTE — Progress Notes (Signed)
Office Visit Note  Patient: Brandon [name]             Date of Birth: 1940-09-06           MRN: [id number]             PCP: Haydee Salter, MD Referring: Haydee Salter, MD Visit Date: 08/11/2020 Occupation: @GUAROCC @  Subjective:  Pain in both hands  History of Present Illness: Brandon [name] is a 80 y.o. male with history of polymyalgia rheumatica, gout, and DDD.  He is currently taking prednisone 7 mg by mouth daily.  According to the patient over the past several weeks he has been experiencing increased pain and stiffness in multiple joints.  His discomfort is most severe in both shoulders, both hands, and in his legs.  He has been having difficulty raising his arms above his head as well as getting up from a seated position.  He has noticed increased inflammation and decreased grip strength in both hands recently.  He states that his right elbow which was aspirated in the ED revealing uric acid crystals has caused intermittent discomfort as well.  He states that he recently tried increasing the dose of prednisone to 10 mg for 2 days and felt that his arthralgias and joint stiffness improved significantly.    Activities of Daily Living:  Patient reports morning stiffness for all day. Patient Reports nocturnal pain.  Difficulty dressing/grooming: Denies Difficulty climbing stairs: Denies Difficulty getting out of chair: Denies Difficulty using hands for taps, buttons, cutlery, and/or writing: Reports  Review of Systems  Constitutional:  Negative for fatigue.  HENT:  Positive for mouth dryness and nose dryness. Negative for mouth sores.   Eyes:  Positive for dryness. Negative for pain and itching.  Respiratory:  Negative for shortness of breath and difficulty breathing.   Cardiovascular:  Negative for chest pain and palpitations.  Gastrointestinal:  Negative for blood in stool, constipation and diarrhea.  Endocrine: Negative for increased urination.  Genitourinary:  Negative for  difficulty urinating.  Musculoskeletal:  Positive for joint pain, joint pain and morning stiffness. Negative for joint swelling, myalgias, muscle tenderness and myalgias.  Skin:  Negative for color change, rash and redness.  Allergic/Immunologic: Negative for susceptible to infections.  Neurological:  Positive for numbness. Negative for dizziness, headaches, memory loss and weakness.  Hematological:  Positive for bruising/bleeding tendency.  Psychiatric/Behavioral:  Negative for confusion.    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   TIA (transient ischemic attack)    Esophageal stricture    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   Polymyalgia rheumatica ([place]) 04/19/2020   Primary osteoarthritis of both hands 01/11/2020   Piriformis syndrome of both sides 09/23/2019   Pain of both shoulder joints 07/29/2019   Sciatica associated with disorder of lumbar spine 04/24/2019   Primary osteoarthritis involving multiple joints 04/24/2019   Chronic pain syndrome 04/24/2019   Anxiety and depression 04/15/2018   Oropharyngeal dysphagia 03/11/2018   Cancer of tonsillar fossa ([place]) 09/20/2017   Liver abscess 07/10/2016   Aneurysm of infrarenal abdominal aorta ([place]) 07/10/2016   Normocytic anemia 07/10/2016   Essential hypertension 05/19/2013   Osteoarthritis 05/29/2009   Psoriasis 01/13/2009   Coronary artery disease 09/15/2008   Severe aortic stenosis 01/28/2008   Diverticulosis of colon 08/27/2007  History of benign prostatic hyperplasia 08/27/2007   Hyperlipidemia 03/19/2007   Gastroesophageal reflux disease 03/19/2007    Past Medical History:  Diagnosis Date   Abdominal aneurysm ([place])    Arthritis    "all over" (07/19/2016)   BPH (benign prostatic hypertrophy)    CAD (coronary  artery disease)    Chicken pox    Chronic lower back pain    s/p surgical fusion   Depression    Diverticulosis    Esophageal stricture    GERD (gastroesophageal reflux disease)    Hepatitis B 1984   Hiatal hernia    History of radiation therapy 01/16/18- 03/05/18   Left Tonsil, 66 Gy in 33 fractions to high risk nodal echelons.    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 Burt Knack and Dr Cyndia Bent    Severe aortic stenosis    TIA (transient ischemic attack) 1990s   hx   tonsillar ca dx'd 11/2017   Tubular adenoma of colon 2009    Family History  Problem Relation Age of Onset   Heart disease Father 50       Living   Coronary artery disease Father        CABG   Alzheimer's disease Mother 93       Deceased   Arthritis Mother    Aneurysm Brother    Stomach cancer Maternal Uncle    Brain cancer Maternal Aunt        x2   Obesity Daughter        Had Bypass Sx   Past Surgical History:  Procedure Laterality Date   BACK SURGERY     CARDIAC CATHETERIZATION     CATARACT EXTRACTION W/ INTRAOCULAR LENS  IMPLANT, BILATERAL Bilateral 01/2012 - 02/2012   CORONARY ATHERECTOMY N/A 04/27/2020   Procedure: CORONARY ATHERECTOMY;  Surgeon: Sherren Mocha, MD;  Location: [facility];  Service: Cardiovascular;  Laterality: N/A;   CORONARY STENT INTERVENTION N/A 04/27/2020   Procedure: CORONARY STENT INTERVENTION;  Surgeon: Sherren Mocha, MD;  Location: [facility];  Service: Cardiovascular;  Laterality: N/A;   DIRECT LARYNGOSCOPY Left 10/09/2017   Procedure: DIRECT LARYNGOSCOPY WITH BOPSY;  Surgeon: Jodi Marble, MD;  Location: [facility];  Service: ENT;  Laterality: Left;   ESOPHAGOGASTRODUODENOSCOPY (EGD) WITH ESOPHAGEAL DILATION     "couple times" (07/19/2016)   ESOPHAGOSCOPY Left  10/09/2017   Procedure: ESOPHAGOSCOPY;  Surgeon: Jodi Marble, MD;  Location: [facility];  Service: ENT;  Laterality: Left;   INTRAVASCULAR IMAGING/OCT N/A 04/27/2020   Procedure: INTRAVASCULAR IMAGING/OCT;  Surgeon: Sherren Mocha, MD;  Location: [facility];  Service: Cardiovascular;  Laterality: N/A;   INTRAVASCULAR PRESSURE WIRE/FFR STUDY N/A 04/27/2020   Procedure: INTRAVASCULAR PRESSURE WIRE/FFR STUDY;  Surgeon: Sherren Mocha, MD;  Location: [facility];  Service: Cardiovascular;  Laterality: N/A;   IR GASTROSTOMY TUBE MOD SED  01/08/2018   IR THORACENTESIS ASP PLEURAL SPACE W/IMG GUIDE  07/19/2016   LAPAROSCOPIC CHOLECYSTECTOMY  1994   LUMBAR [place] SURGERY  05/1996   L4-5; Dr. Claudean Kinds LAMINECTOMY/DECOMPRESSION MICRODISCECTOMY  10/2002   L3-4. Dr. Sherwood Gambler   MULTIPLE TOOTH EXTRACTIONS  1980s   PARTIAL GLOSSECTOMY  12/02/2017   Dr. Nico[facility]- [facility]   pharyngoplasty for closure of tingue  base defect  12/02/2017   Dr. Nicolette Bang- [place]  07/15/2016   POSTERIOR LUMBAR FUSION  09/1996   Ray cage, L4-5 Dr. Rita Ohara   PROSTATE BIOPSY  ~ 2017   radical tonsillectomy Left 12/02/2017   Dr. Nicolette Bang at [place] N/A 03/31/2020   Procedure: RIGHT HEART CATH AND CORONARY ANGIOGRAPHY;  Surgeon: Larey Dresser, MD;  Location: [facility];  Service: Cardiovascular;  Laterality: N/A;   RIGID BRONCHOSCOPY Left 10/09/2017   Procedure: RIGID BRONCHOSCOPY;  Surgeon: Jodi Marble, MD;  Location: [facility];  Service: ENT;  Laterality: Left;   TEE WITHOUT CARDIOVERSION N/A 06/07/2020   Procedure: TRANSESOPHAGEAL ECHOCARDIOGRAM (TEE);  Surgeon: Sherren Mocha, MD;  Location: [place] [facility];  Service: Open Heart Surgery;  Laterality: N/A;   TONSILLECTOMY     TRACHEOSTOMY  12/02/2017   Dr. Nicolette [facility]- [facility]   ULTRASOUND GUIDANCE FOR VASCULAR ACCESS Right 06/07/2020   Procedure: ULTRASOUND GUIDANCE FOR  VASCULAR ACCESS;  Surgeon: Sherren Mocha, MD;  Location: [facility];  Service: Open Heart Surgery;  Laterality: Right;   VASCULAR SURGERY     Social History   Social History Narrative   ** Merged History Encounter **       Married (3rd), [place]. 2 children from 1st marriage, 4 step children.    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       Cell # [phone]   Immunization History  Administered Date(s) Administered   Fluad Quad(high Dose 65+) 10/15/2018, 11/06/2019   Influenza Split 11/26/2010, 10/11/2011, 11/03/2012   Influenza Whole 11/14/2006, 12/04/2007, 10/06/2008, 10/25/2009   Influenza, High Dose Seasonal PF 11/01/2016, 10/30/2017   Influenza, Seasonal, Injecte, Preservative Fre 12/07/2014   Influenza,inj,Quad PF,6+ Mos 11/04/2015   Influenza-Unspecified 11/19/2013   PFIZER(Purple Top)SARS-COV-2 Vaccination 02/25/2019, 03/18/2019, 11/16/2019   Pneumococcal Conjugate-13 12/07/2014   Pneumococcal Polysaccharide-23 05/30/2017   Tdap 01/28/2020     Objective: Vital Signs: BP (!) 148/73 (BP Location: Left Arm, Patient Position: Sitting, Cuff Size: Normal)   Pulse 61   Ht 5' 6.5" (1.689 m)   Wt 173 lb (78.5 kg)   BMI 27.50 kg/m    Physical Exam Vitals and nursing note reviewed.  Constitutional:      Appearance: He is well-developed.  HENT:     Head: Normocephalic and atraumatic.  Eyes:     Conjunctiva/sclera: Conjunctivae normal.     Pupils: Pupils are equal, round, and reactive to light.  Pulmonary:     Effort: Pulmonary effort is normal.  Abdominal:     Palpations: Abdomen is soft.  Musculoskeletal:     Cervical back: Normal range of motion and neck supple.  Skin:    General: Skin is warm and dry.     Capillary Refill: Capillary refill takes less than 2 seconds.  Neurological:     Mental Status: He is alert and oriented to person, place, and time.  Psychiatric:        Behavior:  Behavior normal.     Musculoskeletal Exam: C-spine has limited range of motion with lateral rotation.  Shoulder joints have painful range of motion bilaterally.  Mild flexion contracture of right elbow.  No olecranon bursitis or synovitis of the elbow was noted.  Wrist joints have good range of motion with no discomfort or inflammation.  He has tenderness over MCP and PIP joints but  no obvious synovitis was noted.  He has PIP and DIP thickening consistent with osteoarthritis of both hands.  He is having difficulty making a complete fist due to the pain and stiffness.  Knee joints have good range of motion with no warmth or effusion.  Ankle joints have good range of motion with no tenderness or joint swelling.  He is experiencing some difficulty rising from a seated position due to discomfort in both hips.  CDAI Exam: CDAI Score: -- Patient Global: --; Provider Global: -- Swollen: --; Tender: -- Joint Exam 08/11/2020   No joint exam has been documented for this visit   There is currently no information documented on the homunculus. Go to the Rheumatology activity and complete the homunculus joint exam.  Investigation: No additional findings.  Imaging: ECHOCARDIOGRAM COMPLETE  Result Date: 08/10/2020    ECHOCARDIOGRAM REPORT   Patient Name:   Brandon [name]  Date of Exam: 08/10/2020 Medical Rec #:  [id number]     Height:       68.0 in Accession #:    [id number]    Weight:       169.4 lb Date of Birth:  03-15-1940     BSA:          1.904 m Patient Age:    39 years      BP:           133/72 mmHg Patient Gender: M             HR:           72 bpm. Exam Location:  [address] Procedure: 2D Echo, 3D Echo, Cardiac Doppler, Color Doppler and Strain Analysis Indications:    Z95.2 TAVR  History:        Patient has prior history of Echocardiogram examinations, most                 recent 06/08/2020. CAD, TIA, Aortic Valve Disease; Risk                 Factors:Hypertension, Dyslipidemia and Former Smoker. AAA.                  Anemia.                 Aortic Valve: 29 mm Sapien prosthetic, stented (TAVR) valve is                 present in the aortic position. Procedure Date: 06/07/2020.  Sonographer:    Jessee Avers, RDCS Referring Phys: [id number] Woodfin Ganja THOMPSON IMPRESSIONS  1. 29 mm S3 in the aortic position. Vmax 2.6 m/s, MG 16 mmHG, EOA 2.90 cm2, DI 0.51. There is mild paravalvular leak in the 5-7 o'clock position. Gradient has increased since last echo. Higher gradient could be related to hyperdynamic LV function, but would consider gated CTA to exclude HALT. The aortic valve has been repaired/replaced. Aortic valve regurgitation is mild. There is a 29 mm Sapien prosthetic (TAVR) valve present in the aortic position. Procedure Date: 06/07/2020.  2. Hyperdynamic LV with intracavitary gradient noted up to 16 mmHG. Left ventricular ejection fraction, by estimation, is 70 to 75%. The left ventricle has hyperdynamic function. The left ventricle has no regional wall motion abnormalities. There is moderate concentric left ventricular hypertrophy. Left ventricular diastolic parameters are consistent with Grade II diastolic dysfunction (pseudonormalization). Elevated left ventricular end-diastolic pressure. The E/e' is 20.7. The average left ventricular global longitudinal strain is -20.2 %. The global longitudinal  strain is normal.  3. Right ventricular systolic function is normal. The right ventricular size is normal. Tricuspid regurgitation signal is inadequate for assessing PA pressure.  4. Left atrial size was moderately dilated.  5. The mitral valve is grossly normal. No evidence of mitral valve regurgitation. No evidence of mitral stenosis.  6. Aortic dilatation noted. There is mild dilatation of the aortic root and of the ascending aorta, measuring 40 mm.  7. The inferior vena cava is normal in size with greater than 50% respiratory variability, suggesting right atrial pressure of 3 mmHg. Comparison(s): Changes from prior  study are noted. Hyperdynamic LV function. Gradients across TAVR have increased as described above. FINDINGS  Left Ventricle: Hyperdynamic LV with intracavitary gradient noted up to 16 mmHG. Left ventricular ejection fraction, by estimation, is 70 to 75%. The left ventricle has hyperdynamic function. The left ventricle has no regional wall motion abnormalities.  The average left ventricular global longitudinal strain is -20.2 %. The global longitudinal strain is normal. 3D left ventricular ejection fraction analysis performed but not reported based on interpreter judgement due to suboptimal quality. The left ventricular internal cavity size was normal in size. There is moderate concentric left ventricular hypertrophy. Left ventricular diastolic parameters are consistent with Grade II diastolic dysfunction (pseudonormalization). Elevated left ventricular end-diastolic pressure. The E/e' is 20.7. Right Ventricle: The right ventricular size is normal. No increase in right ventricular wall thickness. Right ventricular systolic function is normal. Tricuspid regurgitation signal is inadequate for assessing PA pressure. Left Atrium: Left atrial size was moderately dilated. Right Atrium: Right atrial size was normal in size. Pericardium: Trivial pericardial effusion is present. Presence of pericardial fat pad. Mitral Valve: The mitral valve is grossly normal. Mild mitral annular calcification. No evidence of mitral valve regurgitation. No evidence of mitral valve stenosis. Tricuspid Valve: The tricuspid valve is grossly normal. Tricuspid valve regurgitation is trivial. No evidence of tricuspid stenosis. Aortic Valve: 29 mm S3 in the aortic position. Vmax 2.6 m/s, MG 16 mmHG, EOA 2.90 cm2, DI 0.51. There is mild paravalvular leak in the 5-7 o'clock position. Gradient has increased since last echo. Higher gradient could be related to hyperdynamic LV function, but would consider gated CTA to exclude HALT. The aortic valve has  been repaired/replaced. Aortic valve regurgitation is mild. Aortic regurgitation PHT measures 329 msec. Aortic valve mean gradient measures 16.0 mmHg. Aortic valve peak gradient  measures 28.5 mmHg. Aortic valve area, by VTI measures 2.90 cm. There is a 29 mm Sapien prosthetic, stented (TAVR) valve present in the aortic position. Procedure Date: 06/07/2020. Pulmonic Valve: The pulmonic valve was grossly normal. Pulmonic valve regurgitation is not visualized. No evidence of pulmonic stenosis. Aorta: Aortic dilatation noted. There is mild dilatation of the aortic root and of the ascending aorta, measuring 40 mm. Venous: The inferior vena cava is normal in size with greater than 50% respiratory variability, suggesting right atrial pressure of 3 mmHg. IAS/Shunts: The atrial septum is grossly normal.  LEFT VENTRICLE PLAX 2D LVIDd:         4.10 cm  Diastology LVIDs:         2.80 cm  LV e' medial:    4.12 cm/s LV PW:         1.50 cm  LV E/e' medial:  20.7 LV IVS:        1.60 cm  LV e' lateral:   7.08 cm/s LVOT diam:     2.70 cm  LV E/e' lateral: 12.0 LV SV:  156 LV SV Index:   82       2D Longitudinal Strain LVOT Area:     5.73 cm 2D Strain GLS (A2C):   -25.2 %                         2D Strain GLS (A3C):   -17.2 %                         2D Strain GLS (A4C):   -18.4 %                         2D Strain GLS Avg:     -20.2 %                          3D Volume EF:                         3D EF:        57 %                         LV EDV:       143 ml                         LV ESV:       61 ml                         LV SV:        82 ml RIGHT VENTRICLE RV Basal diam:  3.20 cm RV S prime:     15.00 cm/s TAPSE (M-mode): 2.2 cm LEFT ATRIUM             Index       RIGHT ATRIUM           Index LA diam:        4.65 cm 2.44 cm/m  RA Pressure: 3.00 mmHg LA Vol (A2C):   89.9 ml 47.20 ml/m RA Area:     16.80 cm LA Vol (A4C):   78.4 ml 41.17 ml/m RA Volume:   41.60 ml  21.84 ml/m LA Biplane Vol: 84.3 ml 44.26 ml/m  AORTIC  VALVE AV Area (Vmax):    2.83 cm AV Area (Vmean):   2.79 cm AV Area (VTI):     2.90 cm AV Vmax:           267.00 cm/s AV Vmean:          173.500 cm/s AV VTI:            0.537 m AV Peak Grad:      28.5 mmHg AV Mean Grad:      16.0 mmHg LVOT Vmax:         132.00 cm/s LVOT Vmean:        84.400 cm/s LVOT VTI:          0.272 m LVOT/AV VTI ratio: 0.51 AI PHT:            329 msec  AORTA Ao Root diam: 4.00 cm Ao Asc diam:  4.00 cm MITRAL VALVE                TRICUSPID VALVE  Estimated RAP:  3.00 mmHg  MV E velocity: 85.10 cm/s   SHUNTS MV A velocity: 115.00 cm/s  Systemic VTI:  0.27 m MV E/A ratio:  0.74         Systemic Diam: 2.70 cm Eleonore Chiquito MD Electronically signed by Eleonore Chiquito MD Signature Date/Time: 08/10/2020/4:04:29 PM    Final    XR Hand 2 View Left  Result Date: 08/11/2020 Severe CMC narrowing and subluxation was noted.  PIP and DIP narrowing was noted.  No MCP, intercarpal radiocarpal joint space narrowing was noted.  No erosive changes were noted. Impression: These findings are consistent with osteoarthritis of the hand.  XR Hand 2 View Right  Result Date: 08/11/2020 Severe CMC narrowing was noted.  PIP and DIP narrowing was noted.  Third MCP narrowing was noted.  No intercarpal or radiocarpal joint space narrowing was noted.  No erosive changes were noted. Impression: These findings are consistent with inflammatory arthritis and osteoarthritis overlap.   Recent Labs: Lab Results  Component Value Date   WBC 9.0 07/01/2020   HGB 11.1 (L) 07/01/2020   PLT 139 (L) 07/01/2020   NA 138 07/01/2020   K 3.5 07/01/2020   CL 101 07/01/2020   CO2 29 07/01/2020   GLUCOSE 126 (H) 07/01/2020   BUN 16 07/01/2020   CREATININE 0.54 (L) 07/01/2020   BILITOT 0.6 07/26/2020   ALKPHOS 121 07/26/2020   AST 19 07/26/2020   ALT 17 07/26/2020   PROT 6.4 (L) 07/26/2020   ALBUMIN 3.5 07/26/2020   CALCIUM 9.1 07/01/2020   GFRAA 109 01/11/2020   QFTBGOLDPLUS NEGATIVE  01/11/2020    Speciality Comments: No specialty comments available.  Procedures:  No procedures performed Allergies: Celebrex [celecoxib]   Assessment / Plan:     Visit Diagnoses: Polymyalgia rheumatica ([place]) - History of muscle weakness and tenderness, elevated sed rate. Prednisone responsive: He presents today with increased pain and stiffness in multiple joints especially both shoulders, both hands, both hips.  He has had some increased difficulty getting up from a seated position as well as raising his arms above his head over the past few weeks.  He is currently taking prednisone 7 mg and has been tapering by 1 mg every 2 months.  Several days ago he took 10 mg of prednisone for 2 days and noticed an improvement in his symptoms.  We will check a sed rate today.  He was advised to increase the dose of prednisone to 10 mg daily.  A new prescription for prednisone was sent to the pharmacy.  We previously discussed adding on methotrexate as a steroid sparing agent but he was hesitant to start on an immunosuppressive agent.  He was advised to notify us if his symptoms do not improve on the increased dose of prednisone.  We will continue to follow-up closely.  His office visit will be in 6 weeks to reassess how he is doing.- Plan: Sedimentation rate  High risk medication use -Patient declined methotrexate initially.  We are hesitant to add an immunosuppressive agent at this time.  He was advised to increase prednisone from 7 mg to 10 mg daily.  We will check CBC and CMP today.- Plan: CBC with Differential/Platelet, COMPLETE METABOLIC PANEL WITH GFR  Long term (current) use of systemic steroids: He has been taking prednisone 7 mg by mouth daily and has been tapering by 1 mg every 2 months.  He was advised to increase the dose of prednisone to 10 mg 1 tablet by mouth  daily.  New prescription for prednisone was sent to the pharmacy. He is aware of the risks of long-term prednisone use.  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  After the aspiration was performed by Dr. Marlou Sa he was given a prednisone taper which resolved the flare.  He has had intermittent pain and inflammation in the right elbow joint.  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 He will take allopurinol 100 mg 1 tablet by mouth daily for 2 weeks and if labs are stable at that time he will increase to 200 mg daily.  Indications, contraindications, and potential side effects of allopurinol were discussed today in detail.  CBC, CMP, and uric acid level were ordered today and the prescription for allopurinol will be pending results.  He will require updated lab work in 2 weeks.  We will hold off on adding colchicine due to the potential drug interaction with statin therapy as well as carvedilol.  He will remain on prednisone and was advised to increase the dose from 7 mg to 10 mg daily.  He was given an informational handout about gout to review including recommended dietary changes.  He will follow-up in 6 weeks to assess his response to allopurinol as well as the increased dose of prednisone.  Plan: Uric acid  Pain in both hands - He presents today with increased pain and stiffness in both hands.  He has been experiencing tenderness in his MCP and PIP joints and has had difficulty making a complete fist.  He has also noticed decreased grip strength.  He has tenderness over MCP and PIP joints but no obvious synovitis was noted.  X-rays of both hands were updated today.  X-rays of the right hand were consistent with inflammatory arthritis and osteoarthritis overlap.  He has narrowing of bilateral CMC joints as well as PIP and PIP joints.  We will check a sed rate today.  He was advised to increase the dose of prednisone from 7 mg to 10 mg daily.  Plan: XR  Hand 2 View Left, XR Hand 2 View Right  Primary osteoarthritis of both hands - He has positive ANA but no clinical features of autoimmune disease.  ENA panel was negative.  He has been experiencing increased pain in both hands as discussed above.  Updated x-rays were obtained today.  No erosive changes were noted.  He did have narrowing of the right third MCP joint as well as osteoarthritic changes.  Psoriasis - Patient developed psoriasis 15 years ago which lasted for 1 year without any recurrence.  DDD (degenerative disc disease), lumbar - Status post fusion by Dr. Sherwood Gambler. Chronic pain.  Followed by pain management.   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   Chronic pain syndrome - Followed by pain management.  He takes roxicodone as needed for pain relief.   Other medical conditions are listed as follows:  History of atherectomy - Performed by Dr. Burt Knack.   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  He was advised to continue DAPT with aspirin and plavix.  Aneurysm of infrarenal abdominal aorta (HCC) - Followed by Dr. Donzetta Matters. Pre TAVR CT showed large infrarenal abdominal aortic aneurysm which is increasing in size.   History of diverticulitis - Followed by Dr. Fuller Plan.  Liver abscess - In 2018  due to diverticulitis.  Treated with IV antibiotics over 3 weeks.  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  History of gastroesophageal reflux (GERD)  History of hyperlipidemia  Tobacco abuse disorder - one pack per day for 50 years.    Orders: Orders Placed This Encounter  Procedures   XR Hand 2 View Left   XR Hand 2 View Right   CBC with Differential/Platelet   COMPLETE METABOLIC PANEL WITH GFR   Sedimentation rate   Uric acid   No orders of the defined types were placed in this  encounter.    Follow-Up Instructions: Return in 6 weeks (on 09/22/2020) for Polymyalgia Rheumatica, DDD.   Ofilia Neas, PA-C  Note - This record has been created using Dragon software.  Chart creation errors have been sought, but may not always  have been located. Such creation errors do not reflect on  the standard of medical care.

## 2020-08-11 ENCOUNTER — Encounter: Payer: Self-pay | Admitting: Physician Assistant

## 2020-08-11 ENCOUNTER — Other Ambulatory Visit: Payer: Self-pay

## 2020-08-11 ENCOUNTER — Ambulatory Visit: Payer: Self-pay

## 2020-08-11 ENCOUNTER — Ambulatory Visit (INDEPENDENT_AMBULATORY_CARE_PROVIDER_SITE_OTHER): Payer: Medicare Other | Admitting: Physician Assistant

## 2020-08-11 VITALS — BP 148/73 | HR 61 | Ht 66.5 in | Wt 173.0 lb

## 2020-08-11 DIAGNOSIS — Z79899 Other long term (current) drug therapy: Secondary | ICD-10-CM

## 2020-08-11 DIAGNOSIS — F32A Depression, unspecified: Secondary | ICD-10-CM

## 2020-08-11 DIAGNOSIS — M19041 Primary osteoarthritis, right hand: Secondary | ICD-10-CM | POA: Diagnosis not present

## 2020-08-11 DIAGNOSIS — I714 Abdominal aortic aneurysm, without rupture: Secondary | ICD-10-CM

## 2020-08-11 DIAGNOSIS — M1A0210 Idiopathic chronic gout, right elbow, without tophus (tophi): Secondary | ICD-10-CM

## 2020-08-11 DIAGNOSIS — M19042 Primary osteoarthritis, left hand: Secondary | ICD-10-CM

## 2020-08-11 DIAGNOSIS — M79642 Pain in left hand: Secondary | ICD-10-CM | POA: Diagnosis not present

## 2020-08-11 DIAGNOSIS — M353 Polymyalgia rheumatica: Secondary | ICD-10-CM

## 2020-08-11 DIAGNOSIS — Z952 Presence of prosthetic heart valve: Secondary | ICD-10-CM

## 2020-08-11 DIAGNOSIS — I10 Essential (primary) hypertension: Secondary | ICD-10-CM

## 2020-08-11 DIAGNOSIS — L409 Psoriasis, unspecified: Secondary | ICD-10-CM

## 2020-08-11 DIAGNOSIS — Z8639 Personal history of other endocrine, nutritional and metabolic disease: Secondary | ICD-10-CM

## 2020-08-11 DIAGNOSIS — M79641 Pain in right hand: Secondary | ICD-10-CM

## 2020-08-11 DIAGNOSIS — Z7952 Long term (current) use of systemic steroids: Secondary | ICD-10-CM

## 2020-08-11 DIAGNOSIS — Z8719 Personal history of other diseases of the digestive system: Secondary | ICD-10-CM

## 2020-08-11 DIAGNOSIS — F419 Anxiety disorder, unspecified: Secondary | ICD-10-CM

## 2020-08-11 DIAGNOSIS — Z9889 Other specified postprocedural states: Secondary | ICD-10-CM

## 2020-08-11 DIAGNOSIS — K750 Abscess of liver: Secondary | ICD-10-CM

## 2020-08-11 DIAGNOSIS — C090 Malignant neoplasm of tonsillar fossa: Secondary | ICD-10-CM

## 2020-08-11 DIAGNOSIS — G894 Chronic pain syndrome: Secondary | ICD-10-CM

## 2020-08-11 DIAGNOSIS — I7143 Infrarenal abdominal aortic aneurysm, without rupture: Secondary | ICD-10-CM

## 2020-08-11 DIAGNOSIS — R1312 Dysphagia, oropharyngeal phase: Secondary | ICD-10-CM

## 2020-08-11 DIAGNOSIS — Z720 Tobacco use: Secondary | ICD-10-CM

## 2020-08-11 DIAGNOSIS — M5136 Other intervertebral disc degeneration, lumbar region: Secondary | ICD-10-CM

## 2020-08-11 MED ORDER — PREDNISONE 10 MG PO TABS: 10.0000 mg | ORAL_TABLET | Freq: Every day | ORAL | 0 refills | Status: DC

## 2020-08-11 NOTE — Patient Instructions (Addendum)
Increase prednisone to 10mg  once daily.  We will call you with lab results. If they look stable, we will send prescription for allopurinol 100mg  once daily. Then recheck labs in 2 weeks. We have walk-in lab hours after 1:30pm  Gout  Gout is painful swelling of your joints. Gout is a type of arthritis. It is caused by having too much uric acid in your body. Uric acid is a chemical that is made when your body breaks down substances called purines. If your body has too much uric acid, sharp crystals can form and build up in your joints. Thiscauses pain and swelling. Gout attacks can happen quickly and be very painful (acute gout). Over time, the attacks can affect more joints and happen more often (chronic gout). What are the causes? Too much uric acid in your blood. This can happen because: Your kidneys do not remove enough uric acid from your blood. Your body makes too much uric acid. You eat too many foods that are high in purines. These foods include organ meats, some seafood, and beer. Trauma or stress. What increases the risk? Having a family history of gout. Being male and middle-aged. Being male and having gone through menopause. Being very overweight (obese). Drinking alcohol, especially beer. Not having enough water in the body (being dehydrated). Losing weight too quickly. Having an organ transplant. Having lead poisoning. Taking certain medicines. Having kidney disease. Having a skin condition called psoriasis. What are the signs or symptoms? An attack of acute gout usually happens in just one joint. The most common place is the big toe. Attacks often start at night. Other joints that may be affected include joints of the feet, ankle, knee, fingers, wrist, or elbow. Symptoms of an attack may include: Very bad pain. Warmth. Swelling. Stiffness. Shiny, red, or purple skin. Tenderness. The affected joint may be very painful to touch. Chills and fever. Chronic gout may  cause symptoms more often. More joints may be involved. You may also have white or yellow lumps (tophi) on your hands or feet or in other areas near your joints. How is this treated? Treatment for this condition has two phases: treating an acute attack and preventing future attacks. Acute gout treatment may include: NSAIDs. Steroids. These are taken by mouth or injected into a joint. Colchicine. This medicine relieves pain and swelling. It can be given by mouth or through an IV tube. Preventive treatment may include: Taking small doses of NSAIDs or colchicine daily. Using a medicine that reduces uric acid levels in your blood. Making changes to your diet. You may need to see a food expert (dietitian) about what to eat and drink to prevent gout. Follow these instructions at home: During a gout attack  If told, put ice on the painful area: Put ice in a plastic bag. Place a towel between your skin and the bag. Leave the ice on for 20 minutes, 2-3 times a day. Raise (elevate) the painful joint above the level of your heart as often as you can. Rest the joint as much as possible. If the joint is in your leg, you may be given crutches. Follow instructions from your doctor about what you cannot eat or drink.  Avoiding future gout attacks Eat a low-purine diet. Avoid foods and drinks such as: Liver. Kidney. Anchovies. Asparagus. Herring. Mushrooms. Mussels. Beer. Stay at a healthy weight. If you want to lose weight, talk with your doctor. Do not lose weight too fast. Start or continue an exercise plan as  told by your doctor. Eating and drinking Drink enough fluids to keep your pee (urine) pale yellow. If you drink alcohol: Limit how much you use to: 0-1 drink a day for women. 0-2 drinks a day for men. Be aware of how much alcohol is in your drink. In the [place] one drink equals one 12 oz bottle of beer (355 mL), one 5 oz glass of wine (148 mL), or one 1 oz glass of hard liquor (44  mL). General instructions Take over-the-counter and prescription medicines only as told by your doctor. Do not drive or use heavy machinery while taking prescription pain medicine. Return to your normal activities as told by your doctor. Ask your doctor what activities are safe for you. Keep all follow-up visits as told by your doctor. This is important. Contact a doctor if: You have another gout attack. You still have symptoms of a gout attack after 10 days of treatment. You have problems (side effects) because of your medicines. You have chills or a fever. You have burning pain when you pee (urinate). You have pain in your lower back or belly. Get help right away if: You have very bad pain. Your pain cannot be controlled. You cannot pee. Summary Gout is painful swelling of the joints. The most common site of pain is the big toe, but it can affect other joints. Medicines and avoiding some foods can help to prevent and treat gout attacks. This information is not intended to replace advice given to you by your health care provider. Make sure you discuss any questions you have with your healthcare provider. Document Revised: 08/14/2017 Document Reviewed: 08/14/2017 Elsevier Patient Education  [place]. Allopurinol Tablets What is this medication? ALLOPURINOL (al oh PURE i nole) treats gout or kidney stones. It may also be used to prevent high uric acid levels after chemotherapy. It works bydecreasing uric acid levels in your body. This medicine may be used for other purposes; ask your health care provider orpharmacist if you have questions. COMMON BRAND NAME(S): Zyloprim What should I tell my care team before I take this medication? They need to know if you have any of these conditions: Kidney disease Liver disease An unusual or allergic reaction to allopurinol, other medications, foods, dyes, or preservatives Pregnant or trying to get pregnant Breast-feeding How should I  use this medication? Take this medication by mouth with a glass of water. Follow the directions on the prescription label. If this medication upsets your stomach, take it with food or milk. Take your doses at regular intervals. Do not take your medicationmore often than directed. Talk to your care team regarding the use of this medication in children. Special care may be needed. While this medication may be prescribed forchildren as young as 6 years for selected conditions, precautions do apply. Overdosage: If you think you have taken too much of this medicine contact apoison control center or emergency room at once. NOTE: This medicine is only for you. Do not share this medicine with others. What if I miss a dose? If you miss a dose, take it as soon as you can. If it is almost time for yournext dose, take only that dose. Do not take double or extra doses. What may interact with this medication? Do not take this medication with the following: Didanosine, ddI This medication may also interact with the following: Certain antibiotics like amoxicillin, ampicillin Certain medications for cancer Certain medications for immunosuppression like azathioprine, cyclosporine, mercaptopurine Chlorpropamide Probenecid Sulfinpyrazone Thiazide diuretics,  like hydrochlorothiazide Warfarin This list may not describe all possible interactions. Give your health care provider a list of all the medicines, herbs, non-prescription drugs, or dietary supplements you use. Also tell them if you smoke, drink alcohol, or use illegaldrugs. Some items may interact with your medicine. What should I watch for while using this medication? Visit your care team for regular checks on your progress. If you are taking this medication to treat gout, you may not have less frequent attacks at first. Keep taking your medication regularly and the attacks should get better within 2 to 6 weeks. Drink plenty of water (10 to 12 full glasses a  day) while you are taking this medication. This will help to reduce stomach upset and reduce therisk of getting gout or kidney stones. Call your care team at once if you get a skin rash together with chills, fever, sore throat, or nausea and vomiting, if you have blood in your urine, ordifficulty passing urine. This medication may cause serious skin reactions. They can happen weeks to months after starting the medication. Contact your care team right away if you notice fevers or flu-like symptoms with a rash. The rash may be red or purple and then turn into blisters or peeling of the skin. Or, you might notice a red rash with swelling of the face, lips or lymph nodes in your neck or under yourarms. Do not take vitamin C without asking your care team. Too much vitamin C canincrease the chance of getting kidney stones. You may get drowsy or dizzy. Do not drive, use machinery, or do anything that needs mental alertness until you know how this medication affects you. Do not stand or sit up quickly, especially if you are an older patient. This reduces the risk of dizzy or fainting spells. Alcohol can make you more drowsy and dizzy. Alcohol can also increase the chance of stomach problems and increasethe amount of uric acid in your blood. Avoid alcoholic drinks. What side effects may I notice from receiving this medication? Side effects that you should report to your care team as soon as possible: Allergic reactions-skin rash, itching, hives, swelling of the face, lips, tongue, or throat Kidney injury-decrease in the amount of urine, swelling of the ankles, hands, or feet Liver injury-right upper belly pain, loss of appetite, nausea, light-colored stool, dark yellow or brown urine, yellowing skin or eyes, unusual weakness or fatigue Rash, fever, and swollen lymph nodes Redness, blistering, peeling, or loosening of the skin, including inside the mouth Side effects that usually do not require medical attention  (report to your careteam if they continue or are bothersome): Diarrhea Drowsiness Nausea Vomiting This list may not describe all possible side effects. Call your doctor for medical advice about side effects. You may report side effects to FDA at1-800-FDA-1088. Where should I keep my medication? Keep out of the reach of children and pets. Store at room temperature between 15 and 25 degrees C (59 and 77 degrees F). Protect from light and moisture. Throw away any unused medication after theexpiration date. NOTE: This sheet is a summary. It may not cover all possible information. If you have questions about this medicine, talk to your doctor, pharmacist, orhealth care provider.  2022 Elsevier/Gold Standard (2020-02-16 15:06:06)  Low-Purine Eating Plan A low-purine eating plan involves making food choices to limit your intake of purine. Purine is a kind of uric acid. Too much uric acid in your blood can cause certain conditions, such as gout and kidney stones.  Eating a low-purinediet can help control these conditions. What are tips for following this plan? Reading food labels Avoid foods with saturated or Trans fat. Check the ingredient list of grains-based foods, such as bread and cereal, to make sure that they contain whole grains. Check the ingredient list of sauces or soups to make sure they do not contain meat or fish. When choosing soft drinks, check the ingredient list to make sure they do not contain high-fructose corn syrup. Shopping  Buy plenty of fresh fruits and vegetables. Avoid buying canned or fresh fish. Buy dairy products labeled as low-fat or nonfat. Avoid buying premade or processed foods. These foods are often high in fat, salt (sodium), and added sugar.  Cooking Use olive oil instead of butter when cooking. Oils like olive oil, canola oil, and sunflower oil contain healthy fats. Meal planning Learn which foods do or do not affect you. If you find out that a food tends  to cause your gout symptoms to flare up, avoid eating that food. You can enjoy foods that do not cause problems. If you have any questions about a food item, talk with your dietitian or health care provider. Limit foods high in fat, especially saturated fat. Fat makes it harder for your body to get rid of uric acid. Choose foods that are lower in fat and are lean sources of protein. General guidelines Limit alcohol intake to no more than 1 drink a day for nonpregnant women and 2 drinks a day for men. One drink equals 12 oz of beer, 5 oz of wine, or 1 oz of hard liquor. Alcohol can affect the way your body gets rid of uric acid. Drink plenty of water to keep your urine clear or pale yellow. Fluids can help remove uric acid from your body. If directed by your health care provider, take a vitamin C supplement. Work with your health care provider and dietitian to develop a plan to achieve or maintain a healthy weight. Losing weight can help reduce uric acid in your blood. What foods are recommended? The items listed may not be a complete list. Talk with your dietitian aboutwhat dietary choices are best for you. Foods low in purines Foods low in purines do not need to be limited. These include: All fruits. All low-purine vegetables, pickles, and olives. Breads, pasta, rice, cornbread, and popcorn. Cake and other baked goods. All dairy foods. Eggs, nuts, and nut butters. Spices and condiments, such as salt, herbs, and vinegar. Plant oils, butter, and margarine. Water, sugar-free soft drinks, tea, coffee, and cocoa. Vegetable-based soups, broths, sauces, and gravies. Foods moderate in purines Foods moderate in purines should be limited to the amounts listed.  cup of asparagus, cauliflower, spinach, mushrooms, or green peas, each day. 2/3 cup uncooked oatmeal, each day.  cup dry wheat bran or wheat germ, each day. 2-3 ounces of meat or poultry, each day. 4-6 ounces of shellfish, such as crab,  lobster, oysters, or shrimp, each day. 1 cup cooked beans, peas, or lentils, each day. Soup, broths, or bouillon made from meat or fish. Limit these foods as much as possible. What foods are not recommended? The items listed may not be a complete list. Talk with your dietitian aboutwhat dietary choices are best for you. Limit your intake of foods high in purines, including: Beer and other alcohol. Meat-based gravy or sauce. Canned or fresh fish, such as: Anchovies, sardines, herring, and tuna. Mussels and scallops. Codfish, trout, and haddock. [place] Salines. Organ meats, such as:  Liver or kidney. Tripe. Sweetbreads (thymus gland or pancreas). Wild Clinical biochemist. Yeast or yeast extract supplements. Drinks sweetened with high-fructose corn syrup. Summary Eating a low-purine diet can help control conditions caused by too much uric acid in the body, such as gout or kidney stones. Choose low-purine foods, limit alcohol, and limit foods high in fat. You will learn over time which foods do or do not affect you. If you find out that a food tends to cause your gout symptoms to flare up, avoid eating that food. This information is not intended to replace advice given to you by your health care provider. Make sure you discuss any questions you have with your healthcare provider. Document Revised: 05/07/2019 Document Reviewed: 05/07/2019 Elsevier Patient Education  [place].

## 2020-08-11 NOTE — Progress Notes (Signed)
Please notify the patient of x-ray results.  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  Left hand findings consistent with osteoarthritis.  No erosive changes noted.    Please notify the patient that the new prescription for prednisone 10 mg 1 tablet daily was sent to the pharmacy.    Prescription for allopurinol is pending lab results.

## 2020-08-11 NOTE — Telephone Encounter (Signed)
Pending lab results, patient will be starting allopurinol per Hazel Sams, PA-C. Thanks!

## 2020-08-11 NOTE — Progress Notes (Signed)
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  Because he currently takes carvedlilol and atorvastatin, we will avoid colchicine for gout prophylaxis. Decision was made to start allopurinol.  Plan is to draw CBC, CMP, sed rate, and uric acid prior to sending rx  Dose will be allopurinol 100mg  once daily x 2 weeks. Then recheck labs. He will increase prednisone dose to 10mg  daily. Patient provided with education material regarding allopurinol as well as gout. Discussed low-purine diet including limiting/avoiding shellfish, seafood, red meats, and alcohol.  Knox Saliva, PharmD, MPH Clinical Pharmacist (Rheumatology and Pulmonology)

## 2020-08-12 LAB — CBC WITH DIFFERENTIAL/PLATELET
Absolute Monocytes: 466 cells/uL (ref 200–950)
Basophils Absolute: 24 cells/uL (ref 0–200)
Basophils Relative: 0.3 %
Eosinophils Absolute: 205 cells/uL (ref 15–500)
Eosinophils Relative: 2.6 %
HCT: 36.7 % — ABNORMAL LOW (ref 38.5–50.0)
Hemoglobin: 11 g/dL — ABNORMAL LOW (ref 13.2–17.1)
Lymphs Abs: 695 cells/uL — ABNORMAL LOW (ref 850–3900)
MCH: 24.4 pg — ABNORMAL LOW (ref 27.0–33.0)
MCHC: 30 g/dL — ABNORMAL LOW (ref 32.0–36.0)
MCV: 81.4 fL (ref 80.0–100.0)
MPV: 9.4 fL (ref 7.5–12.5)
Monocytes Relative: 5.9 %
Neutro Abs: 6510 cells/uL (ref 1500–7800)
Neutrophils Relative %: 82.4 %
Platelets: 155 10*3/uL (ref 140–400)
RBC: 4.51 10*6/uL (ref 4.20–5.80)
RDW: 14.3 % (ref 11.0–15.0)
Total Lymphocyte: 8.8 %
WBC: 7.9 10*3/uL (ref 3.8–10.8)

## 2020-08-12 LAB — COMPLETE METABOLIC PANEL WITH GFR
AG Ratio: 1.5 (calc) (ref 1.0–2.5)
ALT: 10 U/L (ref 9–46)
AST: 12 U/L (ref 10–35)
Albumin: 3.7 g/dL (ref 3.6–5.1)
Alkaline phosphatase (APISO): 115 U/L (ref 35–144)
BUN/Creatinine Ratio: 21 (calc) (ref 6–22)
BUN: 14 mg/dL (ref 7–25)
CO2: 33 mmol/L — ABNORMAL HIGH (ref 20–32)
Calcium: 8.9 mg/dL (ref 8.6–10.3)
Chloride: 101 mmol/L (ref 98–110)
Creat: 0.68 mg/dL — ABNORMAL LOW (ref 0.70–1.18)
GFR, Est African American: 107 mL/min/{1.73_m2} (ref >=60)
GFR, Est Non African American: 92 mL/min/{1.73_m2} (ref >=60)
Globulin: 2.5 g/dL (calc) (ref 1.9–3.7)
Glucose, Bld: 115 mg/dL — ABNORMAL HIGH (ref 65–99)
Potassium: 3.7 mmol/L (ref 3.5–5.3)
Sodium: 141 mmol/L (ref 135–146)
Total Bilirubin: 0.6 mg/dL (ref 0.2–1.2)
Total Protein: 6.2 g/dL (ref 6.1–8.1)

## 2020-08-12 LAB — URIC ACID: Uric Acid, Serum: 5.1 mg/dL (ref 4.0–8.0)

## 2020-08-12 LAB — SEDIMENTATION RATE: Sed Rate: 6 mm/h (ref 0–20)

## 2020-08-12 MED ORDER — ALLOPURINOL 100 MG PO TABS: ORAL_TABLET | ORAL | 0 refills | Status: DC

## 2020-08-12 NOTE — Telephone Encounter (Signed)
Labs resulted: Anemia is stable.  CMP stable.  ESR and uric acid WNL.    Ok to start on allopurinol 100 mg daily x2 weeks and if labs are stable at that time he will increase to 200 mg daily.

## 2020-08-12 NOTE — Progress Notes (Signed)
Anemia is stable.  CMP stable.  ESR and uric acid WNL.   Ok to start on allopurinol 100 mg daily x2 weeks and if labs are stable at that time he will increase to 200 mg daily.

## 2020-08-16 ENCOUNTER — Telehealth: Payer: Self-pay | Admitting: Physical Medicine and Rehabilitation

## 2020-08-16 NOTE — Telephone Encounter (Signed)
Patient is needing a refill on oxycodone 5mg .  Please call patient when this is done [phone].

## 2020-08-17 MED ORDER — OXYCODONE HCL 5 MG PO TABS: 5.0000 mg | ORAL_TABLET | Freq: Four times a day (QID) | ORAL | 0 refills | Status: DC | PRN

## 2020-08-17 NOTE — Telephone Encounter (Signed)
Patient notified

## 2020-08-17 NOTE — Addendum Note (Signed)
Addended by: Courtney Heys on: 08/17/2020 09:30 AM   Modules accepted: Orders

## 2020-08-17 NOTE — Telephone Encounter (Signed)
I've refilled pt's Oxycodone 5 mg q6 hours prn #120- was due- please let pt know it's refilled- ML

## 2020-08-19 ENCOUNTER — Encounter: Payer: Self-pay | Admitting: Gastroenterology

## 2020-08-19 ENCOUNTER — Other Ambulatory Visit: Payer: Self-pay

## 2020-08-19 DIAGNOSIS — Z952 Presence of prosthetic heart valve: Secondary | ICD-10-CM

## 2020-08-19 DIAGNOSIS — I350 Nonrheumatic aortic (valve) stenosis: Secondary | ICD-10-CM

## 2020-08-23 ENCOUNTER — Other Ambulatory Visit: Payer: Self-pay

## 2020-08-23 ENCOUNTER — Encounter (HOSPITAL_COMMUNITY): Payer: Self-pay | Admitting: Cardiology

## 2020-08-23 ENCOUNTER — Ambulatory Visit (HOSPITAL_COMMUNITY)
Admission: RE | Admit: 2020-08-23 | Discharge: 2020-08-23 | Disposition: A | Discharge status: Home / Self Care | Payer: Medicare Other | Source: Ambulatory Visit | Attending: Cardiology | Admitting: Cardiology

## 2020-08-23 VITALS — BP 138/70 | HR 64 | Wt 177.0 lb

## 2020-08-23 DIAGNOSIS — I2510 Atherosclerotic heart disease of native coronary artery without angina pectoris: Secondary | ICD-10-CM | POA: Insufficient documentation

## 2020-08-23 DIAGNOSIS — Z7902 Long term (current) use of antithrombotics/antiplatelets: Secondary | ICD-10-CM | POA: Diagnosis not present

## 2020-08-23 DIAGNOSIS — Z955 Presence of coronary angioplasty implant and graft: Secondary | ICD-10-CM | POA: Diagnosis not present

## 2020-08-23 DIAGNOSIS — I714 Abdominal aortic aneurysm, without rupture: Secondary | ICD-10-CM | POA: Diagnosis not present

## 2020-08-23 DIAGNOSIS — Z952 Presence of prosthetic heart valve: Secondary | ICD-10-CM | POA: Diagnosis not present

## 2020-08-23 DIAGNOSIS — Z7982 Long term (current) use of aspirin: Secondary | ICD-10-CM | POA: Diagnosis not present

## 2020-08-23 DIAGNOSIS — Z953 Presence of xenogenic heart valve: Secondary | ICD-10-CM | POA: Diagnosis not present

## 2020-08-23 DIAGNOSIS — I10 Essential (primary) hypertension: Secondary | ICD-10-CM | POA: Insufficient documentation

## 2020-08-23 DIAGNOSIS — M353 Polymyalgia rheumatica: Secondary | ICD-10-CM | POA: Diagnosis not present

## 2020-08-23 DIAGNOSIS — I350 Nonrheumatic aortic (valve) stenosis: Secondary | ICD-10-CM | POA: Insufficient documentation

## 2020-08-23 DIAGNOSIS — Z8673 Personal history of transient ischemic attack (TIA), and cerebral infarction without residual deficits: Secondary | ICD-10-CM | POA: Diagnosis not present

## 2020-08-23 DIAGNOSIS — Z79899 Other long term (current) drug therapy: Secondary | ICD-10-CM | POA: Insufficient documentation

## 2020-08-23 DIAGNOSIS — E785 Hyperlipidemia, unspecified: Secondary | ICD-10-CM | POA: Insufficient documentation

## 2020-08-23 DIAGNOSIS — Z87891 Personal history of nicotine dependence: Secondary | ICD-10-CM | POA: Insufficient documentation

## 2020-08-23 MED ORDER — CARVEDILOL 12.5 MG PO TABS: 12.5000 mg | ORAL_TABLET | Freq: Two times a day (BID) | ORAL | 11 refills | Status: DC

## 2020-08-23 NOTE — Progress Notes (Signed)
Patient ID: Brandon [name], male   DOB: [birth year], 80 y.o.   MRN: [id number] PCP: Elyn Aquas Cardiology: Dr. Aundra Dubin  80 y.o.with history of CAD, HTN, and hyperlipidemia presents for followup.  Patient had described some exertional chest pain and myoview showed a partially reversible inferior defect, so LHC was done in 7/10.  This showed that his proximal RCA was totally occluded.  There were very robust left to right collaterals.  There was also a 50% proximal LAD stenosis.  The patient was managed medically.    Most recent echo in 5/20 showed EF 65-70% with moderate AS and mild AI.     Earlier in 2018, he developed a Strep intermedius liver abscess.  He had a hepatic drain placed and also had a right pleural effusion requiring diuresis.    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  He is now cancer-free.    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 Abdominal US in 2/22 showed stable 4.8 cm AAA.   Echo in 2/22 showed EF 60-65% with mild LVH, normal RV, PASP 45 mmHg, severe AS with mean gradient 50 mmHg and AVA 0.96 cm^2. RHC/LHC was done in preparation for AVR, this showed 95% pLAD stenosis, 60-70% pLCx, and CTO RCA with collaterals.  FFR was negative for LCx, patient had DES to LAD.   Patient had TAVR in 5/22 with 29 mm Edwards Sapien THV.  Echo in 7/22 showed hyperdynamic LV with EF 70-75%, normal RV, bioprosthetic aortic valve with mild AI and higher mean gradient 16 mmHg.   Patient returns today for followup of CAD and aortic stenosis. He has been diagnosed with polymyalgia rheumatica and is still taking prednisone, has not been able to titrate completely off.  No chest pain.  Exertional dyspnea has resolved since TAVR.  Good exercise tolerance.  Doing a lot of work around house and yard.  No orthopnea/PND.  No lightheadedness.    Labs (12/10): HDL 28, LDL 70, K 4.3, creatinine 0.8 Labs (4/11): K 4, creatinine 0.8, LDL 53, HDL 26, LFTs  normal Labs (12/11): LDL 74, HDL 27, K 4, creatinine 0.9, LFTs normal, TSH normal Labs (7/12): LDL 87, HDL 33 Labs (9/12): LDL 66, HDL 33, LFTs normal Labs (9/13): LDL 62, HDL 30, K 4.1, creatinine 0.8 Labs (4/14): LDL 88, HDL 27, K 4.4, creatinine 0.8 Labs (4/15): K 4.4, creatinine 0.76 Labs (4/16): LDL 69, HDL 32 Labs (11/16): K 4.3, creatinine 0.76, LDL 68, HDL 31 Labs (1/18): LDL 72 Labs (6/18): K 4.2, creatinine 0.6, LFTs normal, hgb 8.1.  Labs (9/18): K 4, creatinine 0.6, LDL 79, HDL 36 Labs (1/19): K 3.9, creatinine 0.67 Labs (8/21): K 3.7, creatinine 0.78 Labs (2/22): LDL 98 Labs (3/22): K 3.2, creatinine 0.65 Labs (6/22): LDL 77 Labs (7/22): K 3.7,creatinine 0.68  Allergies (verified):  1)  ! Celebrex  Past Medical History: 1. Hyperlipidemia: Myalgias with atorvastatin and Crestor. 2. Aortic stenosis.  Echocardiogram in August 2009 showed EF 60%, no regional wall motion abnormalities, mild LVH, moderate focal basal septal hypertrophy, and mild diastolic dysfunction.  There was a very mild aortic stenosis with partially fused left and right coronary cusps and some restricted motion of the aortic valve.  The mean gradient, however, across the aortic valve was only 8 mmHg.  There was also mild left atrial enlargement and normal RV size and function.  Minimal AS on LHC in 7/10.  Echo (7/14) with EF 60-65%, mild  LVH, mild AS mean gradient 13 mmHg.  Echo (6/16) with EF 55-60%, mild AS with mean gradient 14 mmHg.  - Echo (6/18): EF 55-60%, mild LVH, moderate AS mean gradient 27 mmHg, mild AI.   - Echo (5/20, [facility]): EF 65-70%, moderate AS, mild AI.  - Echo (2/22): EF 60-65% with mild LVH, normal RV, PASP 45 mmHg, severe AS with mean gradient 50 mmHg and AVA 0.96 cm^2.  - TAVR with 29 mm Edwards Sapien THV in 5/22.  - Echo (7/22): Hyperdynamic LV with EF 70-75%, normal RV, bioprosthetic aortic valve with mild AI and higher mean gradient 16 mmHg. 3. Hypertension. 4. Gastroesophageal  reflux disease: h/o esophageal stricture.  5. BPH. 6. Diverticulosis. 7. Smoked until 7/10. 8. CAD: LHC (7/10) showed totally occluded proximal RCA with very robust left to right collaterals, 50% proximal LAD stenosis, EF 65%.   - LHC (2/22): 95% stenosis proximal LAD, 60-70% proximal LCx, totally occluded RCA with collaterals.  Negative FFR of LCx, patient had DES to LAD.   9. Osteoarthritis 10. Colonic polyps 11. h/o TIA 12. 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  This manifested with slurred speech. 13. History of low back pain.  The patient is status post surgical back fusion. 14. History of cholecystectomy.  15. Cataract surgery 16. Prostate cancer: treated 21. Liver abscess: Strep intermedius, 2018.  18. AAA: Noted in 8/18 on CT abdomen, 4.3 cm.  - Abdominal US (10/20): 4.8 cm AAA.  - Abdominal US (2/22): 4.8 cm AAA - CT abdomen 3/22 with 5 cm AAA 19. Carotid stenosis: carotid dopplers 10/18 with 40-59% RICA stenosis.  - Carotid dopplers (10/20): 50-69% RICA stenosis.   - Carotid dopplers (2/22): Mild BICA stenosis.  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  70. Gout  Family History: Father alive w/ CAD & [facility]G [facility] in [facility], had TAVR as well.  Mother died age 32 w/ Alzheimer's disease, hx of DJD 1 Sibling- Brother alive & well w/ DJD in his hips  Social History: Married- 3rd marriage, wife= Etta 2 children from 1st marriage, 4 step children Former smoker, 40+yrs of >1ppd, quit 7/10. Restarted, then quit again in 4/22.  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    Current Outpatient Medications  Medication Sig Dispense Refill   albuterol (VENTOLIN HFA) 108 (90 Base) MCG/ACT inhaler Inhale 2 puffs into the  lungs every 6 (six) hours as needed for wheezing or shortness of breath. 8 g 2   allopurinol (ZYLOPRIM) 100 MG tablet Take 1 tablet (100 mg) by mouth daily for 2 weeks. If labs are stable, increase to 2 tablets (200 mg total) by mouth daily. 42 tablet 0   amLODipine (NORVASC) 10 MG tablet TAKE 1 TABLET(10 MG) BY MOUTH DAILY 90 tablet 3   aspirin 81 MG EC tablet Take 81 mg by mouth at bedtime.     atorvastatin (LIPITOR) 80 MG tablet Take 1 tablet (80 mg total) by mouth daily. 90 tablet 3   citalopram (CELEXA) 20 MG tablet Take 1 tablet (20 mg total) by mouth daily. 30 tablet 5   clopidogrel (PLAVIX) 75 MG tablet Take 1 tablet (75 mg total) by mouth daily. 90 tablet 3   ezetimibe (ZETIA) 10 MG tablet Take 1 tablet (10 mg total) by mouth daily.  30 tablet 11   fluticasone (FLONASE) 50 MCG/ACT nasal spray Place 2 sprays into both nostrils daily as needed for rhinitis or allergies. 11.1 mL 3   gabapentin (NEURONTIN) 300 MG capsule TAKE 1 CAPSULE(300 MG) BY MOUTH THREE TIMES DAILY 90 capsule 5   isosorbide mononitrate (IMDUR) 30 MG 24 hr tablet TAKE 1 TABLET(30 MG) BY MOUTH AT BEDTIME 90 tablet 1   lisinopril (ZESTRIL) 10 MG tablet TAKE 1 TABLET(10 MG) BY MOUTH DAILY 90 tablet 3   oxyCODONE (OXY IR/ROXICODONE) 5 MG immediate release tablet Take 1-2 tablets (5-10 mg total) by mouth every 6 (six) hours as needed for severe pain (to take 2 tabs ONLY when severe pain- which is not usual for pt). 120 tablet 0   pantoprazole (PROTONIX) 40 MG tablet Take 1 tablet (40 mg total) by mouth daily. 90 tablet 3   predniSONE (DELTASONE) 10 MG tablet Take 1 tablet (10 mg total) by mouth daily with breakfast. 30 tablet 0   tamsulosin (FLOMAX) 0.4 MG CAPS capsule Take 0.4 mg by mouth at bedtime.     Tiotropium Bromide Monohydrate (SPIRIVA RESPIMAT) 2.5 MCG/ACT AERS Inhale 2 puffs into the lungs daily. 4 g 3   traMADol (ULTRAM) 50 MG tablet Take 2 tablets (100 mg total) by mouth 2 (two) times daily. 120 tablet 5    carvedilol (COREG) 12.5 MG tablet Take 1 tablet (12.5 mg total) by mouth 2 (two) times daily. 60 tablet 11   No current facility-administered medications for this encounter.    BP 138/70   Pulse 64   Wt 80.3 kg (177 lb)   SpO2 94%   BMI 28.14 kg/m  General: NAD Neck: No JVD, no thyromegaly or thyroid nodule.  Lungs: Occasional rhonchi. CV: Nondisplaced PMI.  Heart regular S1/S2, no S3/S4, 1/6 SEM RUSB.  No peripheral edema.  No carotid bruit.  Normal pedal pulses.  Abdomen: Soft, nontender, no hepatosplenomegaly, no distention.  Skin: Intact without lesions or rashes.  Neurologic: Alert and oriented x 3.  Psych: Normal affect. Extremities: No clubbing or cyanosis.  HEENT: Normal.    Assessment/Plan:  1. Aortic stenosis: Patient had TAVR 06/07/20 by left subclavian access given presence of AAA with 29 mm Edwards Sapien THV.  Echo in 7/22 showed hyperdynamic LV with higher mean gradient at 16 mmHg and mild peri-valvular leakage.  Question of whether elevated gradient was due to hyperdynamic LV or prosthetic valve partial thrombosis.   - Repeat echo in 3 months to follow gradient, may need CT to look for partial valve thrombosis if gradient is higher.  - Increase Coreg to 12.5 mg bid with hyperdynamic LV.  2. CAD: LHC in 2/22 with CTO RCA with collaterals, 95% pLAD treated with DES, 60-70% proximal LCx (FFR negative). No further exertional chest pain since PCI.  - Continue atorvastatin, good lipids in 6/22.  - Continue ASA 81 and Plavix.  3. Hyperlipidemia:  Good lipids in 6/22 as above.  4. HTN: BP mildly elevated today.  - Continue amlodipine and lisinopril.   - Increase Coreg to 12.5 mg bid as above.   5. Abdominal aortic aneurysm: 5 cm by CT in 3/22.   - He has seen Dr. Donzetta Matters with [facility], will followup closely with repeat US.    6. Carotid stenosis: Mild by 2/22 dopplers.  7. Smoking: He has quit.    Followup in 4 months.    Loralie Champagne 08/23/2020

## 2020-08-23 NOTE — Patient Instructions (Signed)
INCREASE Coreg to 12.5 mg, one tab twice a day  Your physician recommends that you schedule a follow-up appointment in: 4 months with Dr Aundra Dubin  Do the following things EVERYDAY: Weigh yourself in the morning before breakfast. Write it down and keep it in a log. Take your medicines as prescribed Eat low salt foods--Limit salt (sodium) to 2000 mg per day.  Stay as active as you can everyday Limit all fluids for the day to less than 2 liters  milAt the [facility], you and your health needs are our priority. As part of our continuing mission to provide you with exceptional heart care, we have created designated Provider Care Teams. These Care Teams include your primary Cardiologist (physician) and Advanced Practice Providers (APPs- Physician Assistants and Nurse Practitioners) who all work together to provide you with the care you need, when you need it.   You may see any of the following providers on your designated Care Team at your next follow up: Dr Glori Bickers Dr Loralie Champagne Dr [name], NP Lyda Jester, [place] Ginnie Smart Audry Riles, PharmD   Please be sure to bring in all your medications bottles to every appointment.   If you have any questions or concerns before your next appointment please send [place] a message through [place] or call our office at [phone].    TO LEAVE A MESSAGE FOR THE NURSE SELECT OPTION 2, PLEASE LEAVE A MESSAGE INCLUDING: YOUR NAME DATE OF BIRTH CALL BACK NUMBER REASON FOR CALL**this is important as we prioritize the call backs  YOU WILL RECEIVE A CALL BACK THE SAME DAY AS LONG AS YOU CALL BEFORE 4:00 PM

## 2020-09-08 ENCOUNTER — Other Ambulatory Visit: Payer: Self-pay

## 2020-09-08 DIAGNOSIS — Z79899 Other long term (current) drug therapy: Secondary | ICD-10-CM | POA: Diagnosis not present

## 2020-09-08 DIAGNOSIS — M1A0210 Idiopathic chronic gout, right elbow, without tophus (tophi): Secondary | ICD-10-CM | POA: Diagnosis not present

## 2020-09-09 ENCOUNTER — Other Ambulatory Visit (HOSPITAL_COMMUNITY): Payer: Self-pay

## 2020-09-09 LAB — CBC WITH DIFFERENTIAL/PLATELET
Absolute Monocytes: 320 {cells}/uL (ref 200–950)
Basophils Absolute: 9 {cells}/uL (ref 0–200)
Basophils Relative: 0.1 %
Eosinophils Absolute: 107 {cells}/uL (ref 15–500)
Eosinophils Relative: 1.2 %
HCT: 35.7 % — ABNORMAL LOW (ref 38.5–50.0)
Hemoglobin: 10.7 g/dL — ABNORMAL LOW (ref 13.2–17.1)
Lymphs Abs: 490 {cells}/uL — ABNORMAL LOW (ref 850–3900)
MCH: 24.2 pg — ABNORMAL LOW (ref 27.0–33.0)
MCHC: 30 g/dL — ABNORMAL LOW (ref 32.0–36.0)
MCV: 80.8 fL (ref 80.0–100.0)
MPV: 9.6 fL (ref 7.5–12.5)
Monocytes Relative: 3.6 %
Neutro Abs: 7974 {cells}/uL — ABNORMAL HIGH (ref 1500–7800)
Neutrophils Relative %: 89.6 %
Platelets: 161 Thousand/uL (ref 140–400)
RBC: 4.42 Million/uL (ref 4.20–5.80)
RDW: 14.2 % (ref 11.0–15.0)
Total Lymphocyte: 5.5 %
WBC: 8.9 Thousand/uL (ref 3.8–10.8)

## 2020-09-09 LAB — COMPLETE METABOLIC PANEL WITH GFR
AG Ratio: 1.6 (calc) (ref 1.0–2.5)
ALT: 10 U/L (ref 9–46)
AST: 12 U/L (ref 10–35)
Albumin: 3.8 g/dL (ref 3.6–5.1)
Alkaline phosphatase (APISO): 115 U/L (ref 35–144)
BUN: 20 mg/dL (ref 7–25)
CO2: 30 mmol/L (ref 20–32)
Calcium: 8.8 mg/dL (ref 8.6–10.3)
Chloride: 102 mmol/L (ref 98–110)
Creat: 0.77 mg/dL (ref 0.70–1.28)
Globulin: 2.4 g/dL (calc) (ref 1.9–3.7)
Glucose, Bld: 196 mg/dL — ABNORMAL HIGH (ref 65–99)
Potassium: 3.6 mmol/L (ref 3.5–5.3)
Sodium: 141 mmol/L (ref 135–146)
Total Bilirubin: 0.8 mg/dL (ref 0.2–1.2)
Total Protein: 6.2 g/dL (ref 6.1–8.1)
eGFR: 92 mL/min/{1.73_m2} (ref >=60)

## 2020-09-09 LAB — URIC ACID: Uric Acid, Serum: 5.5 mg/dL (ref 4.0–8.0)

## 2020-09-09 MED ORDER — LISINOPRIL 10 MG PO TABS
ORAL_TABLET | ORAL | 3 refills | Status: DC
Start: 2020-09-09 — End: 2020-11-25

## 2020-09-09 NOTE — Progress Notes (Signed)
Glucose is 196.  Rest of CMP WNL.  Anemia is stable. Absolute lymphocyte low. Please forward lab work to PCP as requested previously.   Uric acid is within the desirable range.

## 2020-09-10 ENCOUNTER — Other Ambulatory Visit: Payer: Self-pay | Admitting: Physician Assistant

## 2020-09-12 ENCOUNTER — Other Ambulatory Visit: Payer: Self-pay | Admitting: *Deleted

## 2020-09-12 NOTE — Telephone Encounter (Signed)
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  Labs: 09/08/2020 Glucose is 196.  Rest of CMP WNL.  Anemia is stable. Absolute lymphocyte low.Uric acid is within the desirable range.   Spoke with patient and he is currently taking Allopurinol 200 mg daily.    Okay to refill Allopurinol?

## 2020-09-13 NOTE — Progress Notes (Deleted)
Office Visit Note  Patient: Brandon [name]             Date of Birth: [birth year]           MRN: [id number]             PCP: Haydee Salter, MD Referring: Haydee Salter, MD Visit Date: 09/27/2020 Occupation: '@GUAROCC'$ @  Subjective:  No chief complaint on file.   History of Present Illness: Brandon [name] is a 80 y.o. male ***   Activities of Daily Living:  Patient reports morning stiffness for *** {minute/hour:19697}.   Patient {ACTIONS;DENIES/REPORTS:21021675::"Denies"} nocturnal pain.  Difficulty dressing/grooming: {ACTIONS;DENIES/REPORTS:21021675::"Denies"} Difficulty climbing stairs: {ACTIONS;DENIES/REPORTS:21021675::"Denies"} Difficulty getting out of chair: {ACTIONS;DENIES/REPORTS:21021675::"Denies"} Difficulty using hands for taps, buttons, cutlery, and/or writing: {ACTIONS;DENIES/REPORTS:21021675::"Denies"}  No Rheumatology ROS completed.   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   Polymyalgia rheumatica ([place]) 04/19/2020   Primary osteoarthritis of both hands 01/11/2020   Piriformis syndrome of both sides 09/23/2019   Pain of both shoulder joints 07/29/2019   Sciatica associated with disorder of lumbar spine 04/24/2019   Primary osteoarthritis involving multiple joints 04/24/2019   Chronic pain syndrome 04/24/2019   Anxiety and depression 04/15/2018   Oropharyngeal dysphagia 03/11/2018   Cancer of tonsillar fossa ([place]) 09/20/2017   Liver abscess 07/10/2016   Aneurysm of infrarenal abdominal aorta ([place]) 07/10/2016   Normocytic anemia 07/10/2016   Essential hypertension 05/19/2013   Osteoarthritis 05/29/2009   Psoriasis 01/13/2009   Coronary artery disease  09/15/2008   Severe aortic stenosis 01/28/2008   Diverticulosis of colon 08/27/2007   History of benign prostatic hyperplasia 08/27/2007   Hyperlipidemia 03/19/2007   Gastroesophageal reflux disease 03/19/2007    Past Medical History:  Diagnosis Date   Abdominal aneurysm ([place])    Arthritis    "all over" (07/19/2016)   BPH (benign prostatic hypertrophy)    CAD (coronary artery disease)    Chicken pox    Chronic lower back pain    s/p surgical fusion   Depression    Diverticulosis    Esophageal stricture    GERD (gastroesophageal reflux disease)    Hepatitis B 1984   Hiatal hernia    History of radiation therapy 01/16/18- 03/05/18   Left Tonsil, 66 Gy in 33 fractions to high risk nodal echelons.    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 Burt Knack and Dr Cyndia Bent    Severe aortic stenosis    TIA (transient ischemic attack) 1990s   hx   tonsillar ca dx'd 11/2017   Tubular adenoma of colon 2009    Family History  Problem Relation Age of Onset   Heart disease Father 108       Living   Coronary artery disease Father        CABG   Alzheimer's disease Mother 24       Deceased   Arthritis Mother    Aneurysm Brother    Stomach cancer Maternal Uncle    Brain cancer Maternal Aunt  x2   Obesity Daughter        Had Bypass Sx   Past Surgical History:  Procedure Laterality Date   BACK SURGERY     CARDIAC CATHETERIZATION     CATARACT EXTRACTION W/ INTRAOCULAR LENS  IMPLANT, BILATERAL Bilateral 01/2012 - 02/2012   CORONARY ATHERECTOMY N/A 04/27/2020   Procedure: CORONARY ATHERECTOMY;  Surgeon: Sherren Mocha, MD;  Location: [place] [facility];  Service: Cardiovascular;  Laterality: N/A;   CORONARY STENT INTERVENTION N/A 04/27/2020   Procedure: CORONARY STENT INTERVENTION;  Surgeon: Sherren Mocha, MD;  Location: [place] [facility];  Service: Cardiovascular;  Laterality: N/A;   DIRECT LARYNGOSCOPY Left 10/09/2017   Procedure: DIRECT LARYNGOSCOPY WITH BOPSY;  Surgeon: Jodi Marble, MD;  Location: [place];  Service: ENT;  Laterality: Left;   ESOPHAGOGASTRODUODENOSCOPY (EGD) WITH ESOPHAGEAL DILATION     "couple times" (07/19/2016)   ESOPHAGOSCOPY Left 10/09/2017   Procedure: ESOPHAGOSCOPY;  Surgeon: Jodi Marble, MD;  Location: [place];  Service: ENT;  Laterality: Left;   INTRAVASCULAR IMAGING/OCT N/A 04/27/2020   Procedure: INTRAVASCULAR IMAGING/OCT;  Surgeon: Sherren Mocha, MD;  Location: [place] [facility];  Service: Cardiovascular;  Laterality: N/A;   INTRAVASCULAR PRESSURE WIRE/FFR STUDY N/A 04/27/2020   Procedure: INTRAVASCULAR PRESSURE WIRE/FFR STUDY;  Surgeon: Sherren Mocha, MD;  Location: [place] [facility];  Service: Cardiovascular;  Laterality: N/A;   IR GASTROSTOMY TUBE MOD SED  01/08/2018   IR THORACENTESIS ASP PLEURAL SPACE W/IMG GUIDE  07/19/2016   LAPAROSCOPIC CHOLECYSTECTOMY  1994   LUMBAR [place] SURGERY  05/1996   L4-5; Dr. Claudean Kinds LAMINECTOMY/DECOMPRESSION MICRODISCECTOMY  10/2002   L3-4. Dr. Sherwood Gambler   MULTIPLE TOOTH EXTRACTIONS  1980s   PARTIAL GLOSSECTOMY  12/02/2017   Dr. Nicolette Bang- [facility]   pharyngoplasty for closure of tingue base defect  12/02/2017   Dr. Nicolette Bang- [place]  07/15/2016   POSTERIOR LUMBAR FUSION  09/1996   Ray cage, L4-5 Dr. Rita Ohara   PROSTATE BIOPSY  ~ 2017   radical tonsillectomy Left 12/02/2017   Dr. Nicolette Bang at [place] N/A 03/31/2020   Procedure: RIGHT HEART CATH AND CORONARY ANGIOGRAPHY;  Surgeon: Larey Dresser, MD;  Location: [place] [facility];  Service: Cardiovascular;  Laterality: N/A;   RIGID BRONCHOSCOPY Left 10/09/2017   Procedure: RIGID BRONCHOSCOPY;  Surgeon: Jodi Marble, MD;  Location: [place];   Service: ENT;  Laterality: Left;   TEE WITHOUT CARDIOVERSION N/A 06/07/2020   Procedure: TRANSESOPHAGEAL ECHOCARDIOGRAM (TEE);  Surgeon: Sherren Mocha, MD;  Location: [place];  Service: Open Heart Surgery;  Laterality: N/A;   TONSILLECTOMY     TRACHEOSTOMY  12/02/2017   Dr. Nicolette Bang- [facility]   ULTRASOUND GUIDANCE FOR VASCULAR ACCESS Right 06/07/2020   Procedure: ULTRASOUND GUIDANCE FOR VASCULAR ACCESS;  Surgeon: Sherren Mocha, MD;  Location: [place];  Service: Open Heart Surgery;  Laterality: Right;   VASCULAR SURGERY     Social History   Social History Narrative   ** Merged History Encounter **       Married (3rd), [place]. 2 children from 1st marriage, 4 step children.    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       Cell # [phone]   Immunization History  Administered Date(s) Administered   Fluad Quad(high Dose 65+) 10/15/2018, 11/06/2019  Influenza Split 11/26/2010, 10/11/2011, 11/03/2012   Influenza Whole 11/14/2006, 12/04/2007, 10/06/2008, 10/25/2009   Influenza, High Dose Seasonal PF 11/01/2016, 10/30/2017   Influenza, Seasonal, Injecte, Preservative Fre 12/07/2014   Influenza,inj,Quad PF,6+ Mos 11/04/2015   Influenza-Unspecified 11/19/2013   PFIZER(Purple Top)SARS-COV-2 Vaccination 02/25/2019, 03/18/2019, 11/16/2019   Pneumococcal Conjugate-13 12/07/2014   Pneumococcal Polysaccharide-23 05/30/2017   Tdap 01/28/2020     Objective: Vital Signs: There were no vitals taken for this visit.   Physical Exam   Musculoskeletal Exam: ***  CDAI Exam: CDAI Score: -- Patient Global: --; Provider Global: -- Swollen: --; Tender: -- Joint Exam 09/27/2020   No joint exam has been documented for this visit   There is currently no information documented on the homunculus. Go to the Rheumatology activity and complete the homunculus joint exam.  Investigation: No additional findings.  Imaging: No  results found.  Recent Labs: Lab Results  Component Value Date   WBC 8.9 09/08/2020   HGB 10.7 (L) 09/08/2020   PLT 161 09/08/2020   NA 141 09/08/2020   K 3.6 09/08/2020   CL 102 09/08/2020   CO2 30 09/08/2020   GLUCOSE 196 (H) 09/08/2020   BUN 20 09/08/2020   CREATININE 0.77 09/08/2020   BILITOT 0.8 09/08/2020   ALKPHOS 121 07/26/2020   AST 12 09/08/2020   ALT 10 09/08/2020   PROT 6.2 09/08/2020   ALBUMIN 3.5 07/26/2020   CALCIUM 8.8 09/08/2020   GFRAA 107 08/11/2020   QFTBGOLDPLUS NEGATIVE 01/11/2020    Speciality Comments: No specialty comments available.  Procedures:  No procedures performed Allergies: Celebrex [celecoxib]   Assessment / Plan:     Visit Diagnoses: No diagnosis found.  Orders: No orders of the defined types were placed in this encounter.  No orders of the defined types were placed in this encounter.   Face-to-face time spent with patient was *** minutes. Greater than 50% of time was spent in counseling and coordination of care.  Follow-Up Instructions: No follow-ups on file.   Earnestine Mealing, CMA  Note - This record has been created using Editor, commissioning.  Chart creation errors have been sought, but may not always  have been located. Such creation errors do not reflect on  the standard of medical care.

## 2020-09-19 ENCOUNTER — Other Ambulatory Visit: Payer: Self-pay | Admitting: Physician Assistant

## 2020-09-19 ENCOUNTER — Telehealth: Payer: Self-pay | Admitting: *Deleted

## 2020-09-19 MED ORDER — OXYCODONE HCL 5 MG PO TABS: 5.0000 mg | ORAL_TABLET | Freq: Four times a day (QID) | ORAL | 0 refills | Status: DC | PRN

## 2020-09-19 MED ORDER — PREDNISONE 1 MG PO TABS: 4.0000 mg | ORAL_TABLET | Freq: Every day | ORAL | 1 refills | Status: DC

## 2020-09-19 NOTE — Telephone Encounter (Signed)
Spoke with patient and advised patient to decrease prednisone to 9 mg p.o. daily. Patient expressed understanding.

## 2020-09-19 NOTE — Telephone Encounter (Signed)
Next Visit: 09/27/2020   Last Visit: 08/11/2020   Last Fill: 08/11/2020  DX: Polymyalgia rheumatica  Current Dose per office note 08/11/2020: He is currently taking prednisone 7 mg and has been tapering by 1 mg every 2 months. He was advised to increase prednisone from 7 mg to 10 mg daily.   Spoke with patient and he verified he is taking Prednisone 10 mg daily and doing great.   Okay to refill Prednisone?

## 2020-09-19 NOTE — Telephone Encounter (Signed)
Brandon [name] called for a refill on his oxycodone. His last fill date per PMP was 08/17/20 and next appt is 10/28/20.

## 2020-09-19 NOTE — Telephone Encounter (Signed)
Notifed 

## 2020-09-19 NOTE — Telephone Encounter (Signed)
Please advise patient to decrease prednisone to 9 mg p.o. daily.

## 2020-09-22 ENCOUNTER — Inpatient Hospital Stay: Payer: Medicare Other | Attending: Medical | Admitting: Adult Health

## 2020-09-22 ENCOUNTER — Encounter: Payer: Self-pay | Admitting: Adult Health

## 2020-09-22 ENCOUNTER — Other Ambulatory Visit: Payer: Self-pay

## 2020-09-22 ENCOUNTER — Ambulatory Visit: Payer: Medicare Other | Admitting: Medical

## 2020-09-22 VITALS — BP 158/71 | HR 68 | Temp 97.5°F | Resp 18 | Ht 66.0 in | Wt 181.0 lb

## 2020-09-22 DIAGNOSIS — Z85818 Personal history of malignant neoplasm of other sites of lip, oral cavity, and pharynx: Secondary | ICD-10-CM | POA: Insufficient documentation

## 2020-09-22 DIAGNOSIS — Z923 Personal history of irradiation: Secondary | ICD-10-CM | POA: Diagnosis not present

## 2020-09-22 DIAGNOSIS — B370 Candidal stomatitis: Secondary | ICD-10-CM

## 2020-09-22 DIAGNOSIS — R200 Anesthesia of skin: Secondary | ICD-10-CM | POA: Insufficient documentation

## 2020-09-22 DIAGNOSIS — Z7952 Long term (current) use of systemic steroids: Secondary | ICD-10-CM | POA: Diagnosis not present

## 2020-09-22 DIAGNOSIS — Z7982 Long term (current) use of aspirin: Secondary | ICD-10-CM | POA: Insufficient documentation

## 2020-09-22 DIAGNOSIS — C090 Malignant neoplasm of tonsillar fossa: Secondary | ICD-10-CM | POA: Diagnosis not present

## 2020-09-22 DIAGNOSIS — Z87891 Personal history of nicotine dependence: Secondary | ICD-10-CM | POA: Insufficient documentation

## 2020-09-22 DIAGNOSIS — Z79899 Other long term (current) drug therapy: Secondary | ICD-10-CM | POA: Insufficient documentation

## 2020-09-22 MED ORDER — FLUCONAZOLE 150 MG PO TABS: 150.0000 mg | ORAL_TABLET | Freq: Every day | ORAL | 0 refills | Status: DC

## 2020-09-22 NOTE — Progress Notes (Signed)
CLINIC:  Survivorship  REASON FOR VISIT:  Routine follow-up for history of head & neck cancer.  BRIEF ONCOLOGIC HISTORY:  Oncology History  Cancer of tonsillar fossa ([place])  07/06/2017 Miscellaneous   Presented to PCP with pain and pressure in the left ear in June 2019; treated with abx with improvement in the pain    09/20/2017 Initial Diagnosis   Cancer of tonsillar fossa ([place])   09/26/2017 Imaging   CT neck:  1. 2.7 cm left tonsil mass most consistent with carcinoma. There is extension along the glossotonsillar sulcus and mass is indistinguishable from the superior margin of the submandibular gland. 2. 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   10/23/2017 Imaging   PET (at [facility]; available under external documents): 1. Ill-defined left tonsillar lesion showing focal tracer uptake (SUV = 4.5) concerning for malignancy. 2. No definite evidence of FDG avid adenopathy or distant metastasis 3. 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   11/05/2017 Cancer Staging   Staging form: Pharynx - HPV-Mediated Oropharynx, AJCC 8th Edition - Clinical: Stage I (cT2, cN0, cM0, p16+) - Signed by Tish Men, MD on 11/06/2017   01/01/2018 Cancer Staging   Staging form: Pharynx - HPV-Mediated Oropharynx, AJCC 8th Edition - Pathologic: Stage II (pT3, pN0, cM0, p16+) - Signed by Eppie Gibson, MD on 01/01/2018      INTERVAL HISTORY:  50 pack year tobacco history, quit 1 year ago.  Has not undergone lung cancer screening.  He has a spot on his tongue his dentist wants to ensure is not cancer before she fits him for his dentures.  He is overall feeling well and has no new concerns other than the oral lesion he isn't sure if it is new or not.    -Pain: none -Nutrition/Diet: Regular, has minor difficulty with steak -Dysphagia?:  still has mild numbness on the left side of his throat. -Dental issues?: endentulous, getting new dentures -Last TSH: 03/25/2020--normal -Weight: 181  -Last ENT visit:  05/2020 -Last Rad Onc visit: 03/2020 -Last Dentist visit: 09/2020    ADDITIONAL REVIEW OF SYSTEMS:  Review of Systems  Constitutional:  Negative for appetite change, chills, fatigue, fever and unexpected weight change.  HENT:   Negative for hearing loss, lump/mass and trouble swallowing.   Eyes:  Negative for eye problems and icterus.  Respiratory:  Negative for chest tightness, cough and shortness of breath.   Cardiovascular:  Negative for chest pain, leg swelling and palpitations.  Gastrointestinal:  Negative for abdominal distention, abdominal pain, constipation, diarrhea, nausea and vomiting.  Endocrine: Negative for hot flashes.  Genitourinary:  Negative for difficulty urinating.   Musculoskeletal:  Negative for arthralgias.  Skin:  Negative for itching and rash.  Neurological:  Negative for dizziness, extremity weakness, headaches and numbness.  Hematological:  Negative for adenopathy. Does not bruise/bleed easily.  Psychiatric/Behavioral:  Negative for depression. The patient is not nervous/anxious.       CURRENT MEDICATIONS:  Current Outpatient Medications on File Prior to Visit  Medication Sig Dispense Refill   albuterol (VENTOLIN HFA) 108 (90 Base) MCG/ACT inhaler Inhale 2 puffs into the lungs every 6 (six) hours as needed for wheezing or shortness of breath. 8 g 2   allopurinol (ZYLOPRIM) 100 MG tablet Take 2 tablets (200 mg total) by mouth daily. 180 tablet 0   amLODipine (NORVASC) 10 MG tablet TAKE  1 TABLET(10 MG) BY MOUTH DAILY 90 tablet 3   aspirin 81 MG EC tablet Take 81 mg by mouth at bedtime.     atorvastatin (LIPITOR) 80 MG tablet Take 1 tablet (80 mg total) by mouth daily. 90 tablet 3   carvedilol (COREG) 12.5 MG tablet Take 1 tablet (12.5 mg total) by mouth 2 (two) times daily. 60 tablet 11    citalopram (CELEXA) 20 MG tablet Take 1 tablet (20 mg total) by mouth daily. 30 tablet 5   clopidogrel (PLAVIX) 75 MG tablet Take 1 tablet (75 mg total) by mouth daily. 90 tablet 3   ezetimibe (ZETIA) 10 MG tablet Take 1 tablet (10 mg total) by mouth daily. 30 tablet 11   fluticasone (FLONASE) 50 MCG/ACT nasal spray Place 2 sprays into both nostrils daily as needed for rhinitis or allergies. 11.1 mL 3   gabapentin (NEURONTIN) 300 MG capsule TAKE 1 CAPSULE(300 MG) BY MOUTH THREE TIMES DAILY 90 capsule 5   isosorbide mononitrate (IMDUR) 30 MG 24 hr tablet TAKE 1 TABLET(30 MG) BY MOUTH AT BEDTIME 90 tablet 1   lisinopril (ZESTRIL) 10 MG tablet TAKE 1 TABLET(10 MG) BY MOUTH DAILY 90 tablet 3   oxyCODONE (OXY IR/ROXICODONE) 5 MG immediate release tablet Take 1-2 tablets (5-10 mg total) by mouth every 6 (six) hours as needed for severe pain (to take 2 tabs ONLY when severe pain- which is not usual for pt). 120 tablet 0   pantoprazole (PROTONIX) 40 MG tablet Take 1 tablet (40 mg total) by mouth daily. 90 tablet 3   predniSONE (DELTASONE) 1 MG tablet Take 4 tablets (4 mg total) by mouth daily with breakfast. 120 tablet 1   predniSONE (DELTASONE) 5 MG tablet Take 1 tablet (5 mg total) by mouth daily with breakfast. 30 tablet 1   tamsulosin (FLOMAX) 0.4 MG CAPS capsule Take 0.4 mg by mouth at bedtime.     Tiotropium Bromide Monohydrate (SPIRIVA RESPIMAT) 2.5 MCG/ACT AERS Inhale 2 puffs into the lungs daily. 4 g 3   traMADol (ULTRAM) 50 MG tablet Take 2 tablets (100 mg total) by mouth 2 (two) times daily. 120 tablet 5   No current facility-administered medications on file prior to visit.    ALLERGIES:  Allergies  Allergen Reactions   Celebrex [Celecoxib] Hives and Itching     PHYSICAL EXAM:  Vitals:   09/22/20 1102  BP: (!) 158/71  Pulse: 68  Resp: 18  Temp: (!) 97.5 F (36.4 C)  SpO2: 100%   Filed Weights   09/22/20 1102  Weight: 181 lb (82.1 kg)     General: Well-nourished,  well-appearing male in no acute distress.  Unaccompanied today.  HEENT: Head is atraumatic and normocephalic.  Pupils equal and reactive to light. Conjunctivae clear without exudate.  Sclerae anicteric. Oral mucosa is pink and mois, lesion on the left posterior lateral tongue that is white and hard about 1cm otherwise  Tongue pink, moist, and midline. + thrush in his posterior pharynx Oropharynx is pink and moist, without lesions. Lymph: No preauricular, postauricular, cervical, supraclavicular, or infraclavicular lymphadenopathy noted on palpation.   Neck: No palpable masses. Skin on neck is normal on the right and on the left, it is taught and has radiation changes present Cardiovascular: Normal rate and rhythm. Respiratory: Clear to auscultation bilaterally. Chest expansion symmetric without accessory muscle use; breathing non-labored.  GI: Abdomen soft and round. Non-tender, non-distended. Bowel sounds normoactive.  GU: Deferred.   Neuro: No focal deficits. Steady gait.  Psych: Normal mood and affect for situation. Extremities: No edema.  Skin: Warm and dry.    LABORATORY DATA:  None at this visit.  DIAGNOSTIC IMAGING:  None at this visit.    ASSESSMENT & PLAN:  [name] is a pleasant 80 y.o. male with history of tonsillar cancer, diagnosed in 11/2017;  treated with radiation in 12 and 1, 2020; completed treatment on 02/2018.  Patient presents to survivorship clinic today for routine follow-up after finishing treatment.   1. Head and neck cancer: he has a white lesion at his tongue.  He wonders if it is scar tissue.  I have placed a call to Dr. Nicolette Bang to see if he can tell me if this area is the scar tissue he mentioned at his last note, or if he would prefer to see the patient in person to further evaluate.  Kenson has an appt on 8/23 at 145pm.     2. Nutritional status: Mr. Balandran reports that he is currently able to consume adequate nutrition by mouth.  Weight is stable at 181 lbs  today.  Encouraged to continue to consume adequate hydration and nutrition, as tolerated.    3. At risk for dysphagia: He is doing quite well and swallowing well.  No issues today, to continue SLP exercises.  4 At risk for hypothyroidism: The thyroid gland is often affected after treatment for head & neck cancer.  Mr. Depner most recent TSH was normal.  Will repeat in February, 2023 when due, orders placed today.  5  Lung cancer screening:  [place] now offers eligible patients lung cancer screening with a low-dose chest CT to aid in early detection, provide more effective treatment options, and ultimately improve survival benefits for patients diagnosed early.  Below is the selection criteria for screening:  Medicare patients: 55-77 years; privately insured patients 55-80 years. Active or former smokers who have quit within the last 15 years. 30+ pack-year history of smoking  Exclusion criteria - No signs/symptoms of lung cancer (i.e., no recent history of hemoptysis and no unexplained weight loss >15 pounds in the last 6 months). Willing and healthy enough to undergo biopsies/surgery if needed.  Mr. Pischel currently does meet criteria for lung cancer screening.  Therefore, I have placed a referral for screening today.    6. Health maintenance and wellness promotion: Cancer patients who consume a diet rich in fruits and vegetables have better overall health and decreased risk of cancer recurrence. Mr. Lara was encouraged to consume 5-7 servings of fruits and vegetables per day, as tolerated. Mr. Art was also encouraged to engage in moderate to vigorous exercise for 30 minutes per day most days of the week.    Dispo:  -See Dr. Nicolette Bang 09/27/2020 -Return to cancer center to see Dr. Isidore Moos in 03/2021 with TSH lab -Return to cancer center to see Survivorship NP in on year.    A total of 30 minutes was spent in the face-to-face care of this patient, with greater than 50% of that time spent in  counseling and care-coordination.    Charlestine Massed, NP [place] [phone]  *Total Encounter Time as defined by the Centers for Medicare and Medicaid Services includes, in addition to the face-to-face time of a patient visit (documented in the note above) non-face-to-face time: obtaining and reviewing outside history, ordering and reviewing medications, tests or procedures, care coordination (communications with other health care professionals or caregivers) and documentation in the medical record.

## 2020-09-27 ENCOUNTER — Ambulatory Visit: Payer: Medicare Other | Admitting: Rheumatology

## 2020-09-27 ENCOUNTER — Encounter: Payer: Medicare Other | Admitting: Family Medicine

## 2020-09-27 DIAGNOSIS — Z720 Tobacco use: Secondary | ICD-10-CM

## 2020-09-27 DIAGNOSIS — M1A0210 Idiopathic chronic gout, right elbow, without tophus (tophi): Secondary | ICD-10-CM

## 2020-09-27 DIAGNOSIS — I714 Abdominal aortic aneurysm, without rupture: Secondary | ICD-10-CM

## 2020-09-27 DIAGNOSIS — R42 Dizziness and giddiness: Secondary | ICD-10-CM | POA: Diagnosis not present

## 2020-09-27 DIAGNOSIS — M19041 Primary osteoarthritis, right hand: Secondary | ICD-10-CM

## 2020-09-27 DIAGNOSIS — L409 Psoriasis, unspecified: Secondary | ICD-10-CM

## 2020-09-27 DIAGNOSIS — Z79899 Other long term (current) drug therapy: Secondary | ICD-10-CM

## 2020-09-27 DIAGNOSIS — I10 Essential (primary) hypertension: Secondary | ICD-10-CM

## 2020-09-27 DIAGNOSIS — R5383 Other fatigue: Secondary | ICD-10-CM | POA: Diagnosis not present

## 2020-09-27 DIAGNOSIS — G894 Chronic pain syndrome: Secondary | ICD-10-CM

## 2020-09-27 DIAGNOSIS — C090 Malignant neoplasm of tonsillar fossa: Secondary | ICD-10-CM

## 2020-09-27 DIAGNOSIS — Z8719 Personal history of other diseases of the digestive system: Secondary | ICD-10-CM

## 2020-09-27 DIAGNOSIS — Z7952 Long term (current) use of systemic steroids: Secondary | ICD-10-CM

## 2020-09-27 DIAGNOSIS — M5136 Other intervertebral disc degeneration, lumbar region: Secondary | ICD-10-CM

## 2020-09-27 DIAGNOSIS — Z9889 Other specified postprocedural states: Secondary | ICD-10-CM

## 2020-09-27 DIAGNOSIS — Z952 Presence of prosthetic heart valve: Secondary | ICD-10-CM

## 2020-09-27 DIAGNOSIS — R1312 Dysphagia, oropharyngeal phase: Secondary | ICD-10-CM

## 2020-09-27 DIAGNOSIS — J440 Chronic obstructive pulmonary disease with acute lower respiratory infection: Secondary | ICD-10-CM | POA: Diagnosis not present

## 2020-09-27 DIAGNOSIS — K1321 Leukoplakia of oral mucosa, including tongue: Secondary | ICD-10-CM | POA: Diagnosis not present

## 2020-09-27 DIAGNOSIS — F32A Depression, unspecified: Secondary | ICD-10-CM

## 2020-09-27 DIAGNOSIS — Z8639 Personal history of other endocrine, nutritional and metabolic disease: Secondary | ICD-10-CM

## 2020-09-27 DIAGNOSIS — M353 Polymyalgia rheumatica: Secondary | ICD-10-CM

## 2020-09-27 DIAGNOSIS — K750 Abscess of liver: Secondary | ICD-10-CM

## 2020-09-28 ENCOUNTER — Ambulatory Visit: Payer: Medicare Other | Admitting: Family Medicine

## 2020-09-29 ENCOUNTER — Ambulatory Visit (HOSPITAL_COMMUNITY): Payer: Medicare Other

## 2020-10-05 DIAGNOSIS — Z08 Encounter for follow-up examination after completed treatment for malignant neoplasm: Secondary | ICD-10-CM | POA: Diagnosis not present

## 2020-10-05 DIAGNOSIS — Z85810 Personal history of malignant neoplasm of tongue: Secondary | ICD-10-CM | POA: Diagnosis not present

## 2020-10-05 DIAGNOSIS — Z87891 Personal history of nicotine dependence: Secondary | ICD-10-CM | POA: Diagnosis not present

## 2020-10-05 DIAGNOSIS — C108 Malignant neoplasm of overlapping sites of oropharynx: Secondary | ICD-10-CM | POA: Diagnosis not present

## 2020-10-05 DIAGNOSIS — Z923 Personal history of irradiation: Secondary | ICD-10-CM | POA: Diagnosis not present

## 2020-10-05 DIAGNOSIS — Z9889 Other specified postprocedural states: Secondary | ICD-10-CM | POA: Diagnosis not present

## 2020-10-06 ENCOUNTER — Other Ambulatory Visit: Payer: Self-pay | Admitting: Physical Medicine and Rehabilitation

## 2020-10-15 ENCOUNTER — Other Ambulatory Visit: Payer: Self-pay | Admitting: Physical Medicine and Rehabilitation

## 2020-10-20 ENCOUNTER — Encounter: Payer: Self-pay | Admitting: Family Medicine

## 2020-10-20 ENCOUNTER — Other Ambulatory Visit: Payer: Self-pay

## 2020-10-20 ENCOUNTER — Ambulatory Visit (INDEPENDENT_AMBULATORY_CARE_PROVIDER_SITE_OTHER): Payer: Medicare Other | Admitting: Family Medicine

## 2020-10-20 VITALS — BP 132/58 | Temp 97.5°F | Ht 66.0 in | Wt 178.2 lb

## 2020-10-20 DIAGNOSIS — I714 Abdominal aortic aneurysm, without rupture: Secondary | ICD-10-CM | POA: Diagnosis not present

## 2020-10-20 DIAGNOSIS — Z23 Encounter for immunization: Secondary | ICD-10-CM | POA: Diagnosis not present

## 2020-10-20 DIAGNOSIS — I10 Essential (primary) hypertension: Secondary | ICD-10-CM | POA: Diagnosis not present

## 2020-10-20 DIAGNOSIS — I2510 Atherosclerotic heart disease of native coronary artery without angina pectoris: Secondary | ICD-10-CM

## 2020-10-20 DIAGNOSIS — M353 Polymyalgia rheumatica: Secondary | ICD-10-CM

## 2020-10-20 DIAGNOSIS — C090 Malignant neoplasm of tonsillar fossa: Secondary | ICD-10-CM | POA: Diagnosis not present

## 2020-10-20 DIAGNOSIS — I7143 Infrarenal abdominal aortic aneurysm, without rupture: Secondary | ICD-10-CM

## 2020-10-20 MED ORDER — OXYCODONE HCL 5 MG PO TABS: 5.0000 mg | ORAL_TABLET | Freq: Four times a day (QID) | ORAL | 0 refills | Status: DC | PRN

## 2020-10-20 NOTE — Progress Notes (Signed)
[facility] [address] [place] [address] Dept: [phone] Dept Fax: [phone]  Chronic Care Office Visit  Subjective:    Patient ID: Brandon [name], male    DOB: 03/14/1940, 80 y.o..   MRN: [id number]  Chief Complaint  Patient presents with   Follow-up    3 mo f/u HTN. Bp range 126/60- 147/73    History of Present Illness:  Patient is in today for reassessment of chronic medical issues.  Brandon [name] notes that he was seen in Urgent Care int he past month and diagnosed with pneumonia. He had had symptoms of cough and sweats/chills. He notes he was given an injection, placed on a courseof prednisone, and given antibiotics for 5 days.  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 He underwent surgery, which include removing a portion of his tongue. He had 33 radiation treatments to the area. He has had some residual issue with nasal mucous production. He is currently using Flonase spray, which he has found helpful. He had a follow-up with his cancer doctors since his last visit with me. He has no sign of cancer recurrence.  Brandon [name] Has a history of coronary artery disease (proximal RCA occlusion with robust right to left collaterals, and a 50% proximal LAD stenosis). Earlier this year, he had cardiac cath with stent placement in 2 vessels later. He also had severe aortic stenosis. He underwent transcatheter aortic valve repair in May. He also has a history of an abdominal aortic aneurysm (5 cm). Dr. Donzetta Matters plans to see him again this fall for reassessment.  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 He is currently on 9 mg of prednisone daily. He had been down to 7 mg, but had a flare, so was bumped back up on this.   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   Primary osteoarthritis of both hands 01/11/2020   Piriformis syndrome of both sides 09/23/2019   Pain of both shoulder joints 07/29/2019   Sciatica associated with disorder of lumbar spine 04/24/2019   Primary osteoarthritis involving multiple joints 04/24/2019   Chronic pain syndrome 04/24/2019   Anxiety and depression 04/15/2018   Oropharyngeal dysphagia 03/11/2018   Cancer of tonsillar fossa ([place]) 09/20/2017   Liver abscess 07/10/2016   Aneurysm of infrarenal abdominal aorta ([place]) 07/10/2016   Normocytic anemia 07/10/2016   Essential hypertension 05/19/2013   Osteoarthritis 05/29/2009   Psoriasis 01/13/2009   Coronary artery disease 09/15/2008   Severe aortic stenosis 01/28/2008   Diverticulosis of colon 08/27/2007   History of benign prostatic hyperplasia 08/27/2007   Hyperlipidemia 03/19/2007   Gastroesophageal reflux disease 03/19/2007   Past Surgical History:  Procedure Laterality Date   BACK SURGERY     CARDIAC CATHETERIZATION     CATARACT EXTRACTION W/ INTRAOCULAR LENS  IMPLANT, BILATERAL Bilateral 01/2012 - 02/2012   CORONARY ATHERECTOMY N/A 04/27/2020   Procedure: CORONARY ATHERECTOMY;  Surgeon: Sherren Mocha, MD;  Location: [facility];  Service: Cardiovascular;  Laterality: N/A;   CORONARY STENT INTERVENTION N/A 04/27/2020   Procedure: CORONARY STENT INTERVENTION;  Surgeon: Sherren Mocha, MD;  Location: [facility];  Service: Cardiovascular;  Laterality: N/A;   DIRECT LARYNGOSCOPY Left 10/09/2017   Procedure: DIRECT LARYNGOSCOPY WITH BOPSY;  Surgeon: Jodi Marble, MD;  Location: [facility];  Service: ENT;  Laterality: Left;   ESOPHAGOGASTRODUODENOSCOPY (EGD) WITH ESOPHAGEAL DILATION     "couple times" (07/19/2016)    ESOPHAGOSCOPY Left 10/09/2017   Procedure: ESOPHAGOSCOPY;  Surgeon: Jodi Marble, MD;  Location: [facility];  Service: ENT;  Laterality: Left;   INTRAVASCULAR IMAGING/OCT N/A 04/27/2020   Procedure: INTRAVASCULAR IMAGING/OCT;  Surgeon: Sherren Mocha, MD;  Location: [facility];  Service: Cardiovascular;  Laterality: N/A;   INTRAVASCULAR PRESSURE WIRE/FFR STUDY N/A 04/27/2020   Procedure: INTRAVASCULAR PRESSURE WIRE/FFR STUDY;  Surgeon: Sherren Mocha, MD;  Location: [facility];  Service: Cardiovascular;  Laterality: N/A;   IR GASTROSTOMY TUBE MOD SED  01/08/2018   IR THORACENTESIS ASP PLEURAL SPACE W/IMG GUIDE  07/19/2016   LAPAROSCOPIC CHOLECYSTECTOMY  1994   LUMBAR [place] SURGERY  05/1996   L4-5; Dr. Claudean Kinds LAMINECTOMY/DECOMPRESSION MICRODISCECTOMY  10/2002   L3-4. Dr. Sherwood Gambler   MULTIPLE TOOTH EXTRACTIONS  1980s   PARTIAL GLOSSECTOMY  12/02/2017   Dr. Nicolette Bang- [facility]   pharyngoplasty for closure of tingue base defect  12/02/2017   Dr. Nicolette Bang- [place]  07/15/2016   POSTERIOR LUMBAR FUSION  09/1996   Ray cage, L4-5 Dr. Rita Ohara   PROSTATE BIOPSY  ~ 2017   radical tonsillectomy Left 12/02/2017   Dr. Nicolette Bang at [facility] N/A 03/31/2020   Procedure: RIGHT HEART CATH AND CORONARY ANGIOGRAPHY;  Surgeon: Larey Dresser, MD;  Location: [facility];  Service: Cardiovascular;  Laterality: N/A;   RIGID BRONCHOSCOPY Left 10/09/2017   Procedure: RIGID BRONCHOSCOPY;  Surgeon: Jodi Marble, MD;  Location: [facility];  Service: ENT;  Laterality: Left;   TEE WITHOUT CARDIOVERSION N/A 06/07/2020   Procedure: TRANSESOPHAGEAL ECHOCARDIOGRAM (TEE);  Surgeon: Sherren Mocha, MD;  Location: [facility];  Service: Open Heart Surgery;  Laterality: N/A;   TONSILLECTOMY     TRACHEOSTOMY  12/02/2017   Dr. Nicolette Bang- [facility]   ULTRASOUND GUIDANCE FOR VASCULAR ACCESS Right 06/07/2020   Procedure:  ULTRASOUND GUIDANCE FOR VASCULAR ACCESS;  Surgeon: Sherren Mocha, MD;  Location: [facility];  Service: Open Heart Surgery;  Laterality: Right;   VASCULAR SURGERY     Family History  Problem Relation Age of Onset   Heart disease Father 75       Living   Coronary artery disease Father        CABG   Alzheimer's disease Mother 31       Deceased   Arthritis Mother    Aneurysm Brother    Stomach cancer Maternal Uncle    Brain cancer Maternal Aunt        x2   Obesity Daughter        Had Bypass Sx   Outpatient Medications Prior to Visit  Medication Sig Dispense Refill   traMADol (ULTRAM) 50 MG tablet TAKE 2 TABLETS(100 MG) BY MOUTH TWICE DAILY 120 tablet 5   albuterol (VENTOLIN HFA) 108 (90 Base) MCG/ACT inhaler Inhale 2 puffs into the lungs every 6 (six) hours as needed for wheezing or shortness of breath. 8 g 2   allopurinol (ZYLOPRIM) 100 MG tablet Take 2 tablets (200 mg total) by mouth daily. 180 tablet 0  amLODipine (NORVASC) 10 MG tablet TAKE 1 TABLET(10 MG) BY MOUTH DAILY 90 tablet 3   aspirin 81 MG EC tablet Take 81 mg by mouth at bedtime.     atorvastatin (LIPITOR) 80 MG tablet Take 1 tablet (80 mg total) by mouth daily. 90 tablet 3   carvedilol (COREG) 12.5 MG tablet Take 1 tablet (12.5 mg total) by mouth 2 (two) times daily. 60 tablet 11   citalopram (CELEXA) 20 MG tablet Take 1 tablet (20 mg total) by mouth daily. 30 tablet 5   clopidogrel (PLAVIX) 75 MG tablet Take 1 tablet (75 mg total) by mouth daily. 90 tablet 3   ezetimibe (ZETIA) 10 MG tablet Take 1 tablet (10 mg total) by mouth daily. 30 tablet 11   fluconazole (DIFLUCAN) 150 MG tablet Take 1 tablet (150 mg total) by mouth daily. 5 tablet 0   fluticasone (FLONASE) 50 MCG/ACT nasal spray Place 2 sprays into both nostrils daily as needed for rhinitis or allergies. 11.1 mL 3   gabapentin (NEURONTIN) 300 MG capsule TAKE 1 CAPSULE(300 MG) BY MOUTH THREE TIMES DAILY 90 capsule 5   isosorbide mononitrate (IMDUR) 30 MG 24 hr tablet  TAKE 1 TABLET(30 MG) BY MOUTH AT BEDTIME 90 tablet 1   lisinopril (ZESTRIL) 10 MG tablet TAKE 1 TABLET(10 MG) BY MOUTH DAILY 90 tablet 3   oxyCODONE (OXY IR/ROXICODONE) 5 MG immediate release tablet Take 1-2 tablets (5-10 mg total) by mouth every 6 (six) hours as needed for severe pain (to take 2 tabs ONLY when severe pain- which is not usual for pt). 120 tablet 0   pantoprazole (PROTONIX) 40 MG tablet Take 1 tablet (40 mg total) by mouth daily. 90 tablet 3   predniSONE (DELTASONE) 1 MG tablet Take 4 tablets (4 mg total) by mouth daily with breakfast. 120 tablet 1   predniSONE (DELTASONE) 5 MG tablet Take 1 tablet (5 mg total) by mouth daily with breakfast. 30 tablet 1   tamsulosin (FLOMAX) 0.4 MG CAPS capsule Take 0.4 mg by mouth at bedtime.     Tiotropium Bromide Monohydrate (SPIRIVA RESPIMAT) 2.5 MCG/ACT AERS Inhale 2 puffs into the lungs daily. 4 g 3   No facility-administered medications prior to visit.   Allergies  Allergen Reactions   Celebrex [Celecoxib] Hives and Itching   Objective:   Today's Vitals   10/20/20 1057  BP: (!) 132/58  Temp: (!) 97.5 F (36.4 C)  TempSrc: Temporal  SpO2: 92%  Weight: 178 lb 3.2 oz (80.8 kg)  Height: '5\' 6"'$  (1.676 m)   Body mass index is 28.76 kg/m.   General: Well developed, well nourished. No acute distress. Psych: Alert and oriented. Normal mood and affect.  Health Maintenance Due  Topic Date Due   Zoster Vaccines- Shingrix (1 of 2) Never done   COVID-19 Vaccine (4 - Booster for Pfizer series) 02/08/2020   COLONOSCOPY (Pts 45-61yr Insurance coverage will need to be confirmed)  03/22/2020   INFLUENZA VACCINE  09/05/2020   Lab Results CMP Latest Ref Rng & Units 09/08/2020 08/11/2020 07/26/2020  Glucose 65 - 99 mg/dL 196(H) 115(H) -  BUN 7 - 25 mg/dL 20 14 -  Creatinine 0.70 - 1.28 mg/dL 0.77 0.68(L) -  Sodium 135 - 146 mmol/L 141 141 -  Potassium 3.5 - 5.3 mmol/L 3.6 3.7 -  Chloride 98 - 110 mmol/L 102 101 -  CO2 20 - 32 mmol/L 30  33(H) -  Calcium 8.6 - 10.3 mg/dL 8.8 8.9 -  Total Protein 6.1 -  8.1 g/dL 6.2 6.2 6.4(L)  Total Bilirubin 0.2 - 1.2 mg/dL 0.8 0.6 0.6  Alkaline Phos 38 - 126 U/L - - 121  AST 10 - 35 U/L '12 12 19  '$ ALT 9 - 46 U/L '10 10 17   '$ Lab Results  Component Value Date   WBC 8.9 09/08/2020   HGB 10.7 (L) 09/08/2020   HCT 35.7 (L) 09/08/2020   MCV 80.8 09/08/2020   PLT 161 09/08/2020     Assessment & Plan:   1. Essential hypertension Mr. Crawl blood pressure is at goal. He notes occasional lightheadedness since the increase by cardiology ot his carvedilol. I cautioned him about waiting when he stands before walking away from his seat. He will discuss this further with cardiology.  2. Coronary artery disease involving native coronary artery of native heart without angina pectoris Reviewed consult notes from cardiology. On Plavix, aspirin, carvedilol and Imdur. Stable since stent placement and aortic valve replacement.  3. Aneurysm of infrarenal abdominal aorta [facility]) Mr. Lu will continue to follow with Dr. Donzetta Matters.  4. Cancer of tonsillar fossa (H[facility]) No evidence of local recurrence. He will continue annual follow-up with his oncologist.  5. Polymyalgia rheumatica ([place]) Reviewed notes from rheumatology. Stable on 9 mg of prednisone daily. He has had some elevated blood sugars. I will include checking an HbA1c on his next blood draw  6. Need for influenza vaccination  - Flu Vaccine QUAD High Dose(Fluad)  Haydee Salter, MD

## 2020-10-20 NOTE — Telephone Encounter (Signed)
PMP was Reviewed.  [name] note and telephone message was reviewed. Oxycodone e-scribed today.  Placed a call to Ms. Dunstan regarding the above, she verbalizes understanding.

## 2020-10-20 NOTE — Telephone Encounter (Signed)
Dr. Dagoberto Ligas is out of the office for the next 2 days. Mr. Vandegriff only has on days supply of Oxycodone 5 MG on hand. Will you send in his medication?    Call back phone [phone]. Last PMP fill was on 09/19/2020

## 2020-10-24 ENCOUNTER — Other Ambulatory Visit: Payer: Self-pay | Admitting: Physical Medicine and Rehabilitation

## 2020-10-24 ENCOUNTER — Ambulatory Visit (HOSPITAL_COMMUNITY): Payer: Medicare Other

## 2020-10-25 ENCOUNTER — Telehealth: Payer: Self-pay | Admitting: *Deleted

## 2020-10-25 NOTE — Telephone Encounter (Signed)
This RN was contacted by the very front desk personnel stating pt is there asking for the number for central scheduling due to need to reschedule his CT ordered by [facility]/NP.  Number given per above - noted pt canceled CT scheduled on 09/28/2020 due to " having pneumonia "  Noted appt rescheduled to 9/19 ( same day he came to this office to inquire ) but was canceled due to " no show "  This RN received call approximately 2 pm on 9/19.

## 2020-10-28 ENCOUNTER — Encounter: Payer: Medicare Other | Admitting: Physical Medicine and Rehabilitation

## 2020-10-31 ENCOUNTER — Other Ambulatory Visit: Payer: Self-pay

## 2020-10-31 DIAGNOSIS — I714 Abdominal aortic aneurysm, without rupture, unspecified: Secondary | ICD-10-CM

## 2020-11-01 DIAGNOSIS — N400 Enlarged prostate without lower urinary tract symptoms: Secondary | ICD-10-CM | POA: Insufficient documentation

## 2020-11-01 DIAGNOSIS — R768 Other specified abnormal immunological findings in serum: Secondary | ICD-10-CM | POA: Insufficient documentation

## 2020-11-01 DIAGNOSIS — I81 Portal vein thrombosis: Secondary | ICD-10-CM | POA: Insufficient documentation

## 2020-11-01 DIAGNOSIS — K760 Fatty (change of) liver, not elsewhere classified: Secondary | ICD-10-CM | POA: Insufficient documentation

## 2020-11-02 ENCOUNTER — Encounter: Payer: Self-pay | Admitting: Family Medicine

## 2020-11-02 DIAGNOSIS — R768 Other specified abnormal immunological findings in serum: Secondary | ICD-10-CM | POA: Diagnosis not present

## 2020-11-02 DIAGNOSIS — K760 Fatty (change of) liver, not elsewhere classified: Secondary | ICD-10-CM | POA: Diagnosis not present

## 2020-11-02 DIAGNOSIS — I81 Portal vein thrombosis: Secondary | ICD-10-CM | POA: Diagnosis not present

## 2020-11-05 HISTORY — PX: TOTAL HIP ARTHROPLASTY: SHX124

## 2020-11-07 ENCOUNTER — Ambulatory Visit (HOSPITAL_COMMUNITY): Payer: Medicare Other | Attending: Cardiovascular Disease

## 2020-11-07 ENCOUNTER — Other Ambulatory Visit: Payer: Self-pay

## 2020-11-07 DIAGNOSIS — Z952 Presence of prosthetic heart valve: Secondary | ICD-10-CM | POA: Diagnosis not present

## 2020-11-07 DIAGNOSIS — I350 Nonrheumatic aortic (valve) stenosis: Secondary | ICD-10-CM | POA: Diagnosis not present

## 2020-11-07 LAB — ECHOCARDIOGRAM COMPLETE
AR max vel: 2.12 cm2
AV Area VTI: 2.4 cm2
AV Area mean vel: 2.35 cm2
AV Mean grad: 16 mmHg
AV Peak grad: 32 mmHg
Ao pk vel: 2.83 m/s
Area-P 1/2: 2.34 cm2
S' Lateral: 2.7 cm

## 2020-11-10 ENCOUNTER — Ambulatory Visit (HOSPITAL_COMMUNITY)
Admission: RE | Admit: 2020-11-10 | Discharge: 2020-11-10 | Disposition: A | Discharge status: Home / Self Care | Payer: Medicare Other | Source: Ambulatory Visit | Attending: Adult Health | Admitting: Adult Health

## 2020-11-10 ENCOUNTER — Other Ambulatory Visit: Payer: Self-pay

## 2020-11-10 DIAGNOSIS — Z122 Encounter for screening for malignant neoplasm of respiratory organs: Secondary | ICD-10-CM | POA: Diagnosis not present

## 2020-11-10 DIAGNOSIS — Z87891 Personal history of nicotine dependence: Secondary | ICD-10-CM | POA: Diagnosis not present

## 2020-11-13 ENCOUNTER — Other Ambulatory Visit: Payer: Self-pay | Admitting: Family Medicine

## 2020-11-13 DIAGNOSIS — J310 Chronic rhinitis: Secondary | ICD-10-CM

## 2020-11-14 ENCOUNTER — Telehealth: Payer: Self-pay | Admitting: *Deleted

## 2020-11-14 NOTE — Telephone Encounter (Signed)
Per request of Wilber Bihari, NP RN placed call to pt with good new regarding recent CT lung being negative for cancer.  CT did show plaque buildup and pt states he follows a cardiologist already. Pt verbalized understanding and appreciative of good news call.

## 2020-11-21 ENCOUNTER — Telehealth: Payer: Self-pay

## 2020-11-21 MED ORDER — TRAMADOL HCL 50 MG PO TABS: ORAL_TABLET | ORAL | 5 refills | Status: DC

## 2020-11-21 MED ORDER — OXYCODONE HCL 5 MG PO TABS: 5.0000 mg | ORAL_TABLET | Freq: Four times a day (QID) | ORAL | 0 refills | Status: DC | PRN

## 2020-11-21 NOTE — Telephone Encounter (Signed)
PMP : Oxy 5 mg last written on 10/20/2020, Last filled 10/24/2020 Tramadol last written on 10/17/20, Last filled on 10/18/2020

## 2020-11-21 NOTE — Telephone Encounter (Signed)
I sent in refills- thanks- ML

## 2020-11-21 NOTE — Progress Notes (Deleted)
Office Visit Note  Patient: Brandon [name]             Date of Birth: 05/25/1940           MRN: [id number]             PCP: Haydee Salter, MD Referring: Haydee Salter, MD Visit Date: 12/05/2020 Occupation: @GUAROCC @  Subjective:  No chief complaint on file.   History of Present Illness: Brandon [name] is a 80 y.o. male ***   Activities of Daily Living:  Patient reports morning stiffness for *** {minute/hour:19697}.   Patient {ACTIONS;DENIES/REPORTS:21021675::"Denies"} nocturnal pain.  Difficulty dressing/grooming: {ACTIONS;DENIES/REPORTS:21021675::"Denies"} Difficulty climbing stairs: {ACTIONS;DENIES/REPORTS:21021675::"Denies"} Difficulty getting out of chair: {ACTIONS;DENIES/REPORTS:21021675::"Denies"} Difficulty using hands for taps, buttons, cutlery, and/or writing: {ACTIONS;DENIES/REPORTS:21021675::"Denies"}  No Rheumatology ROS completed.   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   Primary osteoarthritis of both hands 01/11/2020   Piriformis syndrome of both sides 09/23/2019   Pain of both shoulder joints 07/29/2019   Sciatica associated with disorder of lumbar spine 04/24/2019   Primary osteoarthritis involving multiple joints 04/24/2019   Chronic pain syndrome 04/24/2019   Anxiety and depression 04/15/2018   Oropharyngeal dysphagia 03/11/2018   Cancer of tonsillar fossa ([place]) 09/20/2017   Liver abscess 07/10/2016   Aneurysm of infrarenal abdominal aorta 07/10/2016    Normocytic anemia 07/10/2016   Essential hypertension 05/19/2013   Osteoarthritis 05/29/2009   Psoriasis 01/13/2009   Coronary artery disease 09/15/2008   Severe aortic stenosis 01/28/2008   Diverticulosis of colon 08/27/2007   History of benign prostatic hyperplasia 08/27/2007   Hyperlipidemia 03/19/2007   Gastroesophageal reflux disease 03/19/2007    Past Medical History:  Diagnosis Date   Abdominal aneurysm ([place])    Arthritis    "all over" (07/19/2016)   BPH (benign prostatic hypertrophy)    CAD (coronary artery disease)    Chicken pox    Chronic lower back pain    s/p surgical fusion   Depression    Diverticulosis    Esophageal stricture    GERD (gastroesophageal reflux disease)    Hepatitis B 1984   Hiatal hernia    History of radiation therapy 01/16/18- 03/05/18   Left Tonsil, 66 Gy in 33 fractions to high risk nodal echelons.    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 Burt Knack and Dr Cyndia Bent    Severe aortic stenosis    TIA (transient ischemic attack) 1990s   hx   tonsillar ca dx'd 11/2017   Tubular adenoma of colon 2009    Family History  Problem Relation Age of Onset   Heart disease Father 38       Living   Coronary artery disease Father        CABG   Alzheimer's disease Mother 46  Deceased   Arthritis Mother    Aneurysm Brother    Stomach cancer Maternal Uncle    Brain cancer Maternal Aunt        x2   Obesity Daughter        Had Bypass Sx   Past Surgical History:  Procedure Laterality Date   BACK SURGERY     CARDIAC CATHETERIZATION     CATARACT EXTRACTION W/ INTRAOCULAR LENS  IMPLANT, BILATERAL Bilateral 01/2012 - 02/2012   CORONARY ATHERECTOMY N/A 04/27/2020   Procedure: CORONARY ATHERECTOMY;  Surgeon: Sherren Mocha, MD;  Location: [facility];   Service: Cardiovascular;  Laterality: N/A;   CORONARY STENT INTERVENTION N/A 04/27/2020   Procedure: CORONARY STENT INTERVENTION;  Surgeon: Sherren Mocha, MD;  Location: [facility];  Service: Cardiovascular;  Laterality: N/A;   DIRECT LARYNGOSCOPY Left 10/09/2017   Procedure: DIRECT LARYNGOSCOPY WITH BOPSY;  Surgeon: Jodi Marble, MD;  Location: [facility];  Service: ENT;  Laterality: Left;   ESOPHAGOGASTRODUODENOSCOPY (EGD) WITH ESOPHAGEAL DILATION     "couple times" (07/19/2016)   ESOPHAGOSCOPY Left 10/09/2017   Procedure: ESOPHAGOSCOPY;  Surgeon: Jodi Marble, MD;  Location: [facility];  Service: ENT;  Laterality: Left;   INTRAVASCULAR IMAGING/OCT N/A 04/27/2020   Procedure: INTRAVASCULAR IMAGING/OCT;  Surgeon: Sherren Mocha, MD;  Location: [facility];  Service: Cardiovascular;  Laterality: N/A;   INTRAVASCULAR PRESSURE WIRE/FFR STUDY N/A 04/27/2020   Procedure: INTRAVASCULAR PRESSURE WIRE/FFR STUDY;  Surgeon: Sherren Mocha, MD;  Location: [facility];  Service: Cardiovascular;  Laterality: N/A;   IR GASTROSTOMY TUBE MOD SED  01/08/2018   IR THORACENTESIS ASP PLEURAL SPACE W/IMG GUIDE  07/19/2016   LAPAROSCOPIC CHOLECYSTECTOMY  1994   LUMBAR [place] SURGERY  05/1996   L4-5; Dr. Claudean Kinds LAMINECTOMY/DECOMPRESSION MICRODISCECTOMY  10/2002   L3-4. Dr. Sherwood Gambler   MULTIPLE TOOTH EXTRACTIONS  1980s   PARTIAL GLOSSECTOMY  12/02/2017   Dr. Nicolette Bang- [facility]   pharyngoplasty for closure of tingue base defect  12/02/2017   Dr. Nicolette Bang- [place]  07/15/2016   POSTERIOR LUMBAR FUSION  09/1996   Ray cage, L4-5 Dr. Rita Ohara   PROSTATE BIOPSY  ~ 2017   radical tonsillectomy Left 12/02/2017   Dr. Nicolette Bang at [place] N/A 03/31/2020   Procedure: RIGHT HEART CATH AND CORONARY ANGIOGRAPHY;  Surgeon: Larey Dresser, MD;  Location: [place] [facility];  Service: Cardiovascular;   Laterality: N/A;   RIGID BRONCHOSCOPY Left 10/09/2017   Procedure: RIGID BRONCHOSCOPY;  Surgeon: Jodi Marble, MD;  Location: [place];  Service: ENT;  Laterality: Left;   TEE WITHOUT CARDIOVERSION N/A 06/07/2020   Procedure: TRANSESOPHAGEAL ECHOCARDIOGRAM (TEE);  Surgeon: Sherren Mocha, MD;  Location: [place];  Service: Open Heart Surgery;  Laterality: N/A;   TONSILLECTOMY     TRACHEOSTOMY  12/02/2017   Dr. Nicolette Bang- [facility]   ULTRASOUND GUIDANCE FOR VASCULAR ACCESS Right 06/07/2020   Procedure: ULTRASOUND GUIDANCE FOR VASCULAR ACCESS;  Surgeon: Sherren Mocha, MD;  Location: [place];  Service: Open Heart Surgery;  Laterality: Right;   VASCULAR SURGERY     Social History   Social History Narrative   ** Merged History Encounter **       Married (3rd), [place]. 2 children from 1st marriage, 4 step children.    Retired on disability due to back    Former Engineer, mining.   restores  antique furniture for a hobby.       Cell # [id number]   Immunization History  Administered Date(s) Administered   Fluad Quad(high Dose 65+) 10/15/2018, 11/06/2019, 10/20/2020   Influenza Split 11/26/2010, 10/11/2011, 11/03/2012   Influenza Whole 11/14/2006, 12/04/2007, 10/06/2008, 10/25/2009   Influenza, High Dose Seasonal PF 11/01/2016, 10/30/2017   Influenza, Seasonal, Injecte, Preservative Fre 12/07/2014   Influenza,inj,Quad PF,6+ Mos 11/04/2015   Influenza-Unspecified 11/19/2013   PFIZER(Purple Top)SARS-COV-2 Vaccination 02/25/2019, 03/18/2019, 11/16/2019   Pneumococcal Conjugate-13 12/07/2014   Pneumococcal Polysaccharide-23 05/30/2017   Tdap 01/28/2020     Objective: Vital Signs: There were no vitals taken for this visit.   Physical Exam   Musculoskeletal Exam: ***  CDAI Exam: CDAI Score: -- Patient Global: --; Provider Global: -- Swollen: --; Tender: -- Joint Exam 12/05/2020   No joint exam has been documented for this visit   There is currently no  information documented on the homunculus. Go to the Rheumatology activity and complete the homunculus joint exam.  Investigation: No additional findings.  Imaging: ECHOCARDIOGRAM COMPLETE  Result Date: 11/07/2020    ECHOCARDIOGRAM REPORT   Patient Name:   Brandon [name]  Date of Exam: 11/07/2020 Medical Rec #:  [id number]     Height:       66.0 in Accession #:    [id number]    Weight:       178.2 lb Date of Birth:  [birth year]     BSA:          1.904 m Patient Age:    73 years      BP:           146/69 mmHg Patient Gender: M             HR:           63 bpm. Exam Location:  [place] Procedure: 2D Echo, Cardiac Doppler and Color Doppler Indications:    Z95.2 S/p TAVR  History:        Patient has prior history of Echocardiogram examinations, most                 recent 08/10/2020. S/p TAVR (32mm Sapien); Risk                 Factors:Hypertension, Dyslipidemia and Former Smoker.  Sonographer:    Coralyn Helling RDCS Referring Phys: [id number] [place]  1. Left ventricular ejection fraction, by estimation, is 60 to 65%. The left ventricle has normal function. The left ventricle has no regional wall motion abnormalities. There is moderate asymmetric left ventricular hypertrophy of the basal and septal segments. Left ventricular diastolic parameters are consistent with Grade I diastolic dysfunction (impaired relaxation).  2. Right ventricular systolic function is normal. The right ventricular size is normal.  3. Left atrial size was moderately dilated.  4. The mitral valve is abnormal. Trivial mitral valve regurgitation. No evidence of mitral stenosis. Moderate mitral annular calcification.  5. Post TAVR with 29 mm Sapien 3 valve gradients stable since TTE done 08/10/20 Trivial PVL slightly less apparent than previously . The aortic valve has been repaired/replaced. Aortic valve regurgitation is trivial. No aortic stenosis is present.  6. The inferior vena cava is normal in size with greater than  50% respiratory variability, suggesting right atrial pressure of 3 mmHg. FINDINGS  Left Ventricle: Left ventricular ejection fraction, by estimation, is 60 to 65%. The left ventricle has normal function. The left ventricle has no regional wall motion abnormalities. The left  ventricular internal cavity size was normal in size. There is  moderate asymmetric left ventricular hypertrophy of the basal and septal segments. Left ventricular diastolic parameters are consistent with Grade I diastolic dysfunction (impaired relaxation). Right Ventricle: The right ventricular size is normal. No increase in right ventricular wall thickness. Right ventricular systolic function is normal. Left Atrium: Left atrial size was moderately dilated. Right Atrium: Right atrial size was normal in size. Pericardium: There is no evidence of pericardial effusion. Mitral Valve: The mitral valve is abnormal. There is moderate thickening of the mitral valve leaflet(s). There is moderate calcification of the mitral valve leaflet(s). Moderate mitral annular calcification. Trivial mitral valve regurgitation. No evidence of mitral valve stenosis. Tricuspid Valve: The tricuspid valve is normal in structure. Tricuspid valve regurgitation is not demonstrated. No evidence of tricuspid stenosis. Aortic Valve: Post TAVR with 29 mm Sapien 3 valve gradients stable since TTE done 08/10/20 Trivial PVL slightly less apparent than previously. The aortic valve has been repaired/replaced. Aortic valve regurgitation is trivial. No aortic stenosis is present. Aortic valve mean gradient measures 16.0 mmHg. Aortic valve peak gradient measures 32.0 mmHg. Aortic valve area, by VTI measures 2.40 cm. Pulmonic Valve: The pulmonic valve was normal in structure. Pulmonic valve regurgitation is not visualized. No evidence of pulmonic stenosis. Aorta: The aortic root is normal in size and structure. Venous: The inferior vena cava is normal in size with greater than 50%  respiratory variability, suggesting right atrial pressure of 3 mmHg. IAS/Shunts: No atrial level shunt detected by color flow Doppler.  LEFT VENTRICLE PLAX 2D LVIDd:         4.20 cm  Diastology LVIDs:         2.70 cm  LV e' medial:    6.53 cm/s LV PW:         1.60 cm  LV E/e' medial:  16.4 LV IVS:        1.90 cm  LV e' lateral:   9.14 cm/s LVOT diam:     2.50 cm  LV E/e' lateral: 11.7 LV SV:         142 LV SV Index:   75 LVOT Area:     4.91 cm  RIGHT VENTRICLE             IVC RV S prime:     16.60 cm/s  IVC diam: 1.40 cm TAPSE (M-mode): 1.5 cm LEFT ATRIUM             Index       RIGHT ATRIUM           Index LA diam:        5.40 cm 2.84 cm/m  RA Pressure: 3.00 mmHg LA Vol (A2C):   65.9 ml 34.61 ml/m RA Area:     17.90 cm LA Vol (A4C):   85.0 ml 44.64 ml/m RA Volume:   38.90 ml  20.43 ml/m LA Biplane Vol: 76.4 ml 40.12 ml/m  AORTIC VALVE AV Area (Vmax):    2.12 cm AV Area (Vmean):   2.35 cm AV Area (VTI):     2.40 cm AV Vmax:           283.00 cm/s AV Vmean:          183.000 cm/s AV VTI:            0.592 m AV Peak Grad:      32.0 mmHg AV Mean Grad:      16.0 mmHg LVOT Vmax:  122.00 cm/s LVOT Vmean:        87.600 cm/s LVOT VTI:          0.289 m LVOT/AV VTI ratio: 0.49  AORTA Ao Root diam: 3.50 cm Ao Asc diam:  3.80 cm MITRAL VALVE                TRICUSPID VALVE MV Area (PHT): 2.34 cm     Estimated RAP:  3.00 mmHg MV Decel Time: 324 msec MV E velocity: 107.00 cm/s  SHUNTS MV A velocity: 122.00 cm/s  Systemic VTI:  0.29 m MV E/A ratio:  0.88         Systemic Diam: 2.50 cm Jenkins Rouge MD Electronically signed by Jenkins Rouge MD Signature Date/Time: 11/07/2020/12:30:12 PM    Final    CT CHEST LUNG CA SCREEN LOW DOSE W/O CM  Result Date: 11/13/2020 CLINICAL DATA:  80 year old male former smoker (quit 1-2 years ago) with 60 pack-year history of smoking. Lung cancer screening examination. EXAM: CT CHEST WITHOUT CONTRAST LOW-DOSE FOR LUNG CANCER SCREENING TECHNIQUE: Multidetector CT imaging of the chest  was performed following the standard protocol without IV contrast. COMPARISON:  Chest CTA 04/05/2020. FINDINGS: Cardiovascular: Heart size is normal. There is no significant pericardial fluid, thickening or pericardial calcification. There is aortic atherosclerosis, as well as atherosclerosis of the great vessels of the mediastinum and the coronary arteries, including calcified atherosclerotic plaque in the left main, left anterior descending, left circumflex and right coronary arteries. Mild calcifications of the mitral annulus. Status post TAVR. Mediastinum/Nodes: No pathologically enlarged mediastinal or hilar lymph nodes. Please note that accurate exclusion of hilar adenopathy is limited on noncontrast CT scans. Irregularity of the proximal trachea adjacent to site of tracheostomy. Esophagus is unremarkable in appearance. No axillary lymphadenopathy. Lungs/Pleura: No suspicious appearing pulmonary nodules or masses are noted. No acute consolidative airspace disease. No pleural effusions. Diffuse bronchial wall thickening with mild centrilobular and paraseptal emphysema. Upper Abdomen: Aortic atherosclerosis. Musculoskeletal: There are no aggressive appearing lytic or blastic lesions noted in the visualized portions of the skeleton. IMPRESSION: 1. Lung-RADS 1S, negative. Continue annual screening with low-dose chest CT without contrast in 12 months. 2. The "S" modifier above refers to potentially clinically significant non lung cancer related findings. Specifically, there is aortic atherosclerosis, in addition to 3 vessel coronary artery disease. Assessment for potential risk factor modification, dietary therapy or pharmacologic therapy may be warranted, if clinically indicated. 3. Mild diffuse bronchial wall thickening with mild centrilobular and paraseptal emphysema; imaging findings suggestive of underlying COPD. Aortic Atherosclerosis (ICD10-I70.0) and Emphysema (ICD10-J43.9). Electronically Signed   By:  Vinnie Langton M.D.   On: 11/13/2020 06:12    Recent Labs: Lab Results  Component Value Date   WBC 8.9 09/08/2020   HGB 10.7 (L) 09/08/2020   PLT 161 09/08/2020   NA 141 09/08/2020   K 3.6 09/08/2020   CL 102 09/08/2020   CO2 30 09/08/2020   GLUCOSE 196 (H) 09/08/2020   BUN 20 09/08/2020   CREATININE 0.77 09/08/2020   BILITOT 0.8 09/08/2020   ALKPHOS 121 07/26/2020   AST 12 09/08/2020   ALT 10 09/08/2020   PROT 6.2 09/08/2020   ALBUMIN 3.5 07/26/2020   CALCIUM 8.8 09/08/2020   GFRAA 107 08/11/2020   QFTBGOLDPLUS NEGATIVE 01/11/2020    Speciality Comments: No specialty comments available.  Procedures:  No procedures performed Allergies: Celebrex [celecoxib]   Assessment / Plan:     Visit Diagnoses: No diagnosis found.  Orders: No orders of  the defined types were placed in this encounter.  No orders of the defined types were placed in this encounter.   Face-to-face time spent with patient was *** minutes. Greater than 50% of time was spent in counseling and coordination of care.  Follow-Up Instructions: No follow-ups on file.   Earnestine Mealing, CMA  Note - This record has been created using Editor, commissioning.  Chart creation errors have been sought, but may not always  have been located. Such creation errors do not reflect on  the standard of medical care.

## 2020-11-24 ENCOUNTER — Ambulatory Visit (HOSPITAL_COMMUNITY)
Admission: RE | Admit: 2020-11-24 | Discharge: 2020-11-24 | Disposition: A | Discharge status: Home / Self Care | Payer: Medicare Other | Source: Ambulatory Visit | Attending: Vascular Surgery | Admitting: Vascular Surgery

## 2020-11-24 ENCOUNTER — Other Ambulatory Visit: Payer: Self-pay

## 2020-11-24 DIAGNOSIS — N400 Enlarged prostate without lower urinary tract symptoms: Secondary | ICD-10-CM | POA: Diagnosis not present

## 2020-11-24 DIAGNOSIS — I7143 Infrarenal abdominal aortic aneurysm, without rupture: Secondary | ICD-10-CM | POA: Diagnosis not present

## 2020-11-24 DIAGNOSIS — I714 Abdominal aortic aneurysm, without rupture, unspecified: Secondary | ICD-10-CM | POA: Diagnosis not present

## 2020-11-24 DIAGNOSIS — K5730 Diverticulosis of large intestine without perforation or abscess without bleeding: Secondary | ICD-10-CM | POA: Diagnosis not present

## 2020-11-24 LAB — POCT I-STAT CREATININE: Creatinine, Ser: 0.7 mg/dL (ref 0.61–1.24)

## 2020-11-24 MED ORDER — IOHEXOL 350 MG/ML SOLN
80.0000 mL | Freq: Once | INTRAVENOUS | Status: AC | PRN
  Administered 2020-11-24: 80 mL via INTRAVENOUS

## 2020-11-25 ENCOUNTER — Encounter (HOSPITAL_COMMUNITY): Payer: Self-pay | Admitting: Cardiology

## 2020-11-25 ENCOUNTER — Ambulatory Visit (HOSPITAL_COMMUNITY)
Admission: RE | Admit: 2020-11-25 | Discharge: 2020-11-25 | Disposition: A | Discharge status: Home / Self Care | Payer: Medicare Other | Source: Ambulatory Visit | Attending: Cardiology | Admitting: Cardiology

## 2020-11-25 ENCOUNTER — Telehealth (HOSPITAL_COMMUNITY): Payer: Self-pay | Admitting: Cardiology

## 2020-11-25 VITALS — BP 140/70 | HR 56 | Wt 186.8 lb

## 2020-11-25 DIAGNOSIS — I5032 Chronic diastolic (congestive) heart failure: Secondary | ICD-10-CM | POA: Insufficient documentation

## 2020-11-25 DIAGNOSIS — I350 Nonrheumatic aortic (valve) stenosis: Secondary | ICD-10-CM | POA: Insufficient documentation

## 2020-11-25 DIAGNOSIS — Z8673 Personal history of transient ischemic attack (TIA), and cerebral infarction without residual deficits: Secondary | ICD-10-CM | POA: Insufficient documentation

## 2020-11-25 DIAGNOSIS — I2510 Atherosclerotic heart disease of native coronary artery without angina pectoris: Secondary | ICD-10-CM | POA: Insufficient documentation

## 2020-11-25 DIAGNOSIS — Z952 Presence of prosthetic heart valve: Secondary | ICD-10-CM | POA: Diagnosis not present

## 2020-11-25 DIAGNOSIS — Z8249 Family history of ischemic heart disease and other diseases of the circulatory system: Secondary | ICD-10-CM | POA: Diagnosis not present

## 2020-11-25 DIAGNOSIS — Z79899 Other long term (current) drug therapy: Secondary | ICD-10-CM | POA: Diagnosis not present

## 2020-11-25 DIAGNOSIS — Z7982 Long term (current) use of aspirin: Secondary | ICD-10-CM | POA: Insufficient documentation

## 2020-11-25 DIAGNOSIS — I714 Abdominal aortic aneurysm, without rupture, unspecified: Secondary | ICD-10-CM | POA: Diagnosis not present

## 2020-11-25 DIAGNOSIS — Z888 Allergy status to other drugs, medicaments and biological substances status: Secondary | ICD-10-CM | POA: Insufficient documentation

## 2020-11-25 DIAGNOSIS — Z87891 Personal history of nicotine dependence: Secondary | ICD-10-CM | POA: Insufficient documentation

## 2020-11-25 DIAGNOSIS — E785 Hyperlipidemia, unspecified: Secondary | ICD-10-CM | POA: Insufficient documentation

## 2020-11-25 DIAGNOSIS — Z955 Presence of coronary angioplasty implant and graft: Secondary | ICD-10-CM | POA: Diagnosis not present

## 2020-11-25 DIAGNOSIS — I110 Hypertensive heart disease with heart failure: Secondary | ICD-10-CM | POA: Diagnosis not present

## 2020-11-25 DIAGNOSIS — Z953 Presence of xenogenic heart valve: Secondary | ICD-10-CM | POA: Insufficient documentation

## 2020-11-25 LAB — BASIC METABOLIC PANEL
Anion gap: 9 (ref 5–15)
BUN: 10 mg/dL (ref 8–23)
CO2: 32 mmol/L (ref 22–32)
Calcium: 8.4 mg/dL — ABNORMAL LOW (ref 8.9–10.3)
Chloride: 98 mmol/L (ref 98–111)
Creatinine, Ser: 0.68 mg/dL (ref 0.61–1.24)
GFR, Estimated: >60 mL/min (ref >60)
Glucose, Bld: 123 mg/dL — ABNORMAL HIGH (ref 70–99)
Potassium: 3.3 mmol/L — ABNORMAL LOW (ref 3.5–5.1)
Sodium: 139 mmol/L (ref 135–145)

## 2020-11-25 LAB — BRAIN NATRIURETIC PEPTIDE: B Natriuretic Peptide: 226 pg/mL — ABNORMAL HIGH (ref 0.0–100.0)

## 2020-11-25 MED ORDER — FUROSEMIDE 20 MG PO TABS: 20.0000 mg | ORAL_TABLET | Freq: Every day | ORAL | 3 refills | Status: DC

## 2020-11-25 MED ORDER — POTASSIUM CHLORIDE ER 10 MEQ PO TBCR: 10.0000 meq | EXTENDED_RELEASE_TABLET | Freq: Every day | ORAL | 3 refills | Status: DC

## 2020-11-25 MED ORDER — LISINOPRIL 20 MG PO TABS: ORAL_TABLET | ORAL | 3 refills | Status: DC

## 2020-11-25 MED ORDER — POTASSIUM CHLORIDE ER 10 MEQ PO TBCR: 20.0000 meq | EXTENDED_RELEASE_TABLET | Freq: Every day | ORAL | 3 refills | Status: DC

## 2020-11-25 NOTE — Patient Instructions (Addendum)
START Lasix 20 mg (1 tab) daily  START Potassium 10 meq (1 tab) daily  INCREASE Lisinopril to 20mg  (1 tab) daily  Labs today and repeat in 10 days We will only contact you if something comes back abnormal or we need to make some changes. Otherwise no news is good news!  Your physician recommends that you schedule a follow-up appointment in: 1 month with Dr Aundra Dubin  Please call office at [phone] option [phone] if you have any questions or concerns.   At the [facility], you and your health needs are our priority. As part of our continuing mission to provide you with exceptional heart care, we have created designated Provider Care Teams. These Care Teams include your primary Cardiologist (physician) and Advanced Practice Providers (APPs- Physician Assistants and Nurse Practitioners) who all work together to provide you with the care you need, when you need it.   You may see any of the following providers on your designated Care Team at your next follow up: Dr Glori Bickers Dr Haynes Kerns, NP Lyda Jester, [place] [facility] [place], [place] Audry Riles, PharmD   Please be sure to bring in all your medications bottles to every appointment.

## 2020-11-25 NOTE — Addendum Note (Signed)
Encounter addended by: Maple Mirza, RN on: 11/25/2020 10:13 AM  Actions taken: Order Reconciliation Section accessed, Pharmacy for encounter modified, Medication List reviewed, Home Medications modified

## 2020-11-25 NOTE — Progress Notes (Signed)
ReDS Vest / Clip - 11/25/20 0900       ReDS Vest / Clip   Station Marker C    Ruler Value 28.5    ReDS Value Range Moderate volume overload    ReDS Actual Value 40

## 2020-11-25 NOTE — Telephone Encounter (Signed)
-----   Message from Larey Dresser, MD sent at 11/25/2020 11:57 AM EDT ----- Would take KCl 20 daily rather than 10 daily.

## 2020-11-25 NOTE — Progress Notes (Signed)
Patient ID: KOI ZANGARA, male   DOB: [birth year], 80 y.o.   MRN: [id number] PCP: Elyn Aquas Cardiology: Dr. Aundra Dubin  79 y.o.with history of CAD, HTN, and hyperlipidemia presents for followup.  Patient had described some exertional chest pain and myoview showed a partially reversible inferior defect, so LHC was done in 7/10.  This showed that his proximal RCA was totally occluded.  There were very robust left to right collaterals.  There was also a 50% proximal LAD stenosis.  The patient was managed medically.    Most recent echo in 5/20 showed EF 65-70% with moderate AS and mild AI.     Earlier in 2018, he developed a Strep intermedius liver abscess.  He had a hepatic drain placed and also had a right pleural effusion requiring diuresis.    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  He is now cancer-free.    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 Abdominal US in 2/22 showed stable 4.8 cm AAA.   Echo in 2/22 showed EF 60-65% with mild LVH, normal RV, PASP 45 mmHg, severe AS with mean gradient 50 mmHg and AVA 0.96 cm^2. RHC/LHC was done in preparation for AVR, this showed 95% pLAD stenosis, 60-70% pLCx, and CTO RCA with collaterals.  FFR was negative for LCx, patient had DES to LAD.   Patient had TAVR in 5/22 with 29 mm Edwards Sapien THV.  Echo in 7/22 showed hyperdynamic LV with EF 70-75%, normal RV, bioprosthetic aortic valve with mild AI and higher mean gradient 16 mmHg.  Echo in 10/22 showed EF 60-65%, moderate asymmetric basal septal hypertrophy, normal RV, normal IVC, bioprosthetic AoV s/p TAVR with mean gradient 16 and trivial PVL.   Patient returns today for followup of CAD and aortic stenosis. He has been diagnosed with polymyalgia rheumatica and is still taking prednisone, has not been able to titrate completely off.  Weight is up 9 lbs.  No dyspnea with normal activities but short of breath with heavier activities such as pulling heavy  garbage can to street.  No chest pain.  No orthopnea/PND.  +Bendopnea.    REDS clip 40%  Labs (12/10): HDL 28, LDL 70, K 4.3, creatinine 0.8 Labs (4/11): K 4, creatinine 0.8, LDL 53, HDL 26, LFTs normal Labs (12/11): LDL 74, HDL 27, K 4, creatinine 0.9, LFTs normal, TSH normal Labs (7/12): LDL 87, HDL 33 Labs (9/12): LDL 66, HDL 33, LFTs normal Labs (9/13): LDL 62, HDL 30, K 4.1, creatinine 0.8 Labs (4/14): LDL 88, HDL 27, K 4.4, creatinine 0.8 Labs (4/15): K 4.4, creatinine 0.76 Labs (4/16): LDL 69, HDL 32 Labs (11/16): K 4.3, creatinine 0.76, LDL 68, HDL 31 Labs (1/18): LDL 72 Labs (6/18): K 4.2, creatinine 0.6, LFTs normal, hgb 8.1.  Labs (9/18): K 4, creatinine 0.6, LDL 79, HDL 36 Labs (1/19): K 3.9, creatinine 0.67 Labs (8/21): K 3.7, creatinine 0.78 Labs (2/22): LDL 98 Labs (3/22): K 3.2, creatinine 0.65 Labs (6/22): LDL 77 Labs (7/22): K 3.7,creatinine 0.68 Labs (8/22): K 3.6, creatinine 0.77, hgb 10.7  Allergies (verified):  1)  ! Celebrex  Past Medical History: 1. Hyperlipidemia: Myalgias with atorvastatin and Crestor. 2. Aortic stenosis.  Echocardiogram in August 2009 showed EF 60%, no regional wall motion abnormalities, mild LVH, moderate focal basal septal hypertrophy, and mild diastolic dysfunction.  There was a very mild aortic stenosis with partially fused left and right coronary cusps and some restricted motion of  the aortic valve.  The mean gradient, however, across the aortic valve was only 8 mmHg.  There was also mild left atrial enlargement and normal RV size and function.  Minimal AS on LHC in 7/10.  Echo (7/14) with EF 60-65%, mild LVH, mild AS mean gradient 13 mmHg.  Echo (6/16) with EF 55-60%, mild AS with mean gradient 14 mmHg.  - Echo (6/18): EF 55-60%, mild LVH, moderate AS mean gradient 27 mmHg, mild AI.   - Echo (5/20, [facility]): EF 65-70%, moderate AS, mild AI.  - Echo (2/22): EF 60-65% with mild LVH, normal RV, PASP 45 mmHg, severe AS with mean gradient 50  mmHg and AVA 0.96 cm^2.  - TAVR with 29 mm Edwards Sapien THV in 5/22.  - Echo (7/22): Hyperdynamic LV with EF 70-75%, normal RV, bioprosthetic aortic valve with mild AI and higher mean gradient 16 mmHg. - Echo (10/22): EF 60-65%, moderate asymmetric basal septal hypertrophy, normal RV, normal IVC, bioprosthetic AoV s/p TAVR with mean gradient 16 and trivial PVL. 3. Hypertension. 4. Gastroesophageal reflux disease: h/o esophageal stricture.  5. BPH. 6. Diverticulosis. 7. Smoked until 7/10. 8. CAD: LHC (7/10) showed totally occluded proximal RCA with very robust left to right collaterals, 50% proximal LAD stenosis, EF 65%.   - LHC (2/22): 95% stenosis proximal LAD, 60-70% proximal LCx, totally occluded RCA with collaterals.  Negative FFR of LCx, patient had DES to LAD.   9. Osteoarthritis 10. Colonic polyps 11. h/o TIA 12. 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  This manifested with slurred speech. 13. History of low back pain.  The patient is status post surgical back fusion. 14. History of cholecystectomy.  15. Cataract surgery 16. Prostate cancer: treated 45. Liver abscess: Strep intermedius, 2018.  18. AAA: Noted in 8/18 on CT abdomen, 4.3 cm.  - Abdominal US (10/20): 4.8 cm AAA.  - Abdominal US (2/22): 4.8 cm AAA - CT abdomen 3/22 with 5 cm AAA - CT abdomen (10/22): 5.2 cm AAA 19. Carotid stenosis: carotid dopplers 10/18 with 40-59% RICA stenosis.  - Carotid dopplers (10/20): 50-69% RICA stenosis.   - Carotid dopplers (2/22): Mild BICA stenosis.  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  25. Gout  Family History: Father alive w/ CAD & CABG at [facility] in [facility], had TAVR as well.  Mother died age 70 w/ Alzheimer's disease, hx of DJD 1 Sibling- Brother alive & well w/ DJD in his hips  Social History: Married- 31rd marriage, wife= Etta 2 children from 1st marriage, 4 step children Former smoker,  40+yrs of >1ppd, quit 7/10. Restarted, then quit again in 4/22.  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    Current Outpatient Medications  Medication Sig Dispense Refill   allopurinol (ZYLOPRIM) 100 MG tablet Take 2 tablets (200 mg total) by mouth daily. 180 tablet 0   amLODipine (NORVASC) 10 MG tablet TAKE 1 TABLET(10 MG) BY MOUTH DAILY 90 tablet 3   aspirin 81 MG EC tablet Take 81 mg by mouth at bedtime.     atorvastatin (LIPITOR) 80 MG tablet Take 1 tablet (80 mg total) by mouth daily. 90 tablet 3   carvedilol (COREG) 12.5 MG tablet Take 1 tablet (12.5 mg total) by mouth 2 (two) times daily. 60 tablet 11   citalopram (  CELEXA) 20 MG tablet Take 1 tablet (20 mg total) by mouth daily. 30 tablet 5   ezetimibe (ZETIA) 10 MG tablet Take 1 tablet (10 mg total) by mouth daily. 30 tablet 11   fluticasone (FLONASE) 50 MCG/ACT nasal spray SHAKE LIQUID AND USE 2 SPRAYS IN EACH NOSTRIL DAILY AS NEEDED FOR RHINITIS OR ALLERGIES 16 g 11   furosemide (LASIX) 20 MG tablet Take 1 tablet (20 mg total) by mouth daily. 90 tablet 3   gabapentin (NEURONTIN) 300 MG capsule TAKE 1 CAPSULE(300 MG) BY MOUTH THREE TIMES DAILY 90 capsule 5   isosorbide mononitrate (IMDUR) 30 MG 24 hr tablet TAKE 1 TABLET(30 MG) BY MOUTH AT BEDTIME 90 tablet 1   mirtazapine (REMERON) 30 MG tablet TAKE 1 TABLET BY MOUTH EVERY NIGHT AT BEDTIME 30 tablet 5   oxyCODONE (OXY IR/ROXICODONE) 5 MG immediate release tablet Take 1-2 tablets (5-10 mg total) by mouth every 6 (six) hours as needed for severe pain (to take 2 tabs ONLY when severe pain- which is not usual for pt). 120 tablet 0   pantoprazole (PROTONIX) 40 MG tablet Take 1 tablet (40 mg total) by mouth daily. 90 tablet 3   potassium chloride (KLOR-CON) 10 MEQ tablet Take 1 tablet (10 mEq total) by mouth daily.  90 tablet 3   predniSONE (DELTASONE) 1 MG tablet Take 4 tablets (4 mg total) by mouth daily with breakfast. 120 tablet 1   tamsulosin (FLOMAX) 0.4 MG CAPS capsule Take 0.4 mg by mouth at bedtime.     traMADol (ULTRAM) 50 MG tablet TAKE 2 TABLETS(100 MG) BY MOUTH TWICE DAILY 120 tablet 5   lisinopril (ZESTRIL) 20 MG tablet TAKE 1 TABLET(10 MG) BY MOUTH DAILY 90 tablet 3   No current facility-administered medications for this encounter.    BP 140/70   Pulse (!) 56   Wt 84.7 kg (186 lb 12.8 oz)   SpO2 96%   BMI 30.15 kg/m  General: NAD Neck: JVP 8-9 cm, no thyromegaly or thyroid nodule.  Lungs: Clear to auscultation bilaterally with normal respiratory effort. CV: Nondisplaced PMI.  Heart regular S1/S2, no S3/S4, 2/6 early SEM RUSB.  Trace ankle edema.  No carotid bruit.  Normal pedal pulses.  Abdomen: Soft, nontender, no hepatosplenomegaly, no distention.  Skin: Intact without lesions or rashes.  Neurologic: Alert and oriented x 3.  Psych: Normal affect. Extremities: No clubbing or cyanosis.  HEENT: Normal.   Assessment/Plan:  1. Aortic stenosis: Patient had TAVR 06/07/20 by left subclavian access given presence of AAA with 29 mm Edwards Sapien THV.  Echo in 10/22 with stable mean gradient 16 mmHg and trivial PVL.  - Valve appears to function normally.  2. CAD: LHC in 2/22 with CTO RCA with collaterals, 95% pLAD treated with DES, 60-70% proximal LCx (FFR negative). No further exertional chest pain since PCI.  - Continue atorvastatin, good lipids in 6/22.  - Continue ASA 81.  Should still be on clopidogrel but not on list.  Will call to confirm (PCI in 2/22).  3. Hyperlipidemia:  Good lipids in 6/22 as above.  4. HTN: BP mildly elevated today.  - Continue amlodipine and Coreg.  - Increase lisinopril to 10 mg daily.  BMET today and in 10 days.   5. Abdominal aortic aneurysm: 5.2 cm by CT in 10/22.   - Will need treatment soon.  Has followup with Dr. Donzetta Matters [facility].  6. Carotid stenosis:  Mild by 2/22 dopplers.  7. Smoking: He has quit.  8. Chronic diastolic CHF: Mild volume overload on exam today, REDS clip 40%.   - Add Lasix 20 mg daily with KCl 10 daily.    Followup in 6 wks with APP.    Loralie Champagne 11/25/2020

## 2020-11-30 ENCOUNTER — Other Ambulatory Visit: Payer: Self-pay

## 2020-11-30 ENCOUNTER — Encounter: Payer: Self-pay | Admitting: Vascular Surgery

## 2020-11-30 ENCOUNTER — Ambulatory Visit (INDEPENDENT_AMBULATORY_CARE_PROVIDER_SITE_OTHER): Payer: Medicare Other | Admitting: Vascular Surgery

## 2020-11-30 VITALS — BP 136/72 | HR 83 | Temp 98.3°F | Resp 20 | Ht 66.0 in | Wt 182.9 lb

## 2020-11-30 DIAGNOSIS — I714 Abdominal aortic aneurysm, without rupture, unspecified: Secondary | ICD-10-CM

## 2020-11-30 DIAGNOSIS — I7143 Infrarenal abdominal aortic aneurysm, without rupture: Secondary | ICD-10-CM | POA: Diagnosis not present

## 2020-11-30 NOTE — Progress Notes (Signed)
Patient ID: DALTIN CRIST, male   DOB: 1941/01/13, 80 y.o.   MRN: [id number]  Reason for Consult: Follow-up   Referred by Haydee Salter, MD  Subjective:     HPI:  ECTOR LAUREL is a 80 y.o. male history of known abdominal aortic aneurysm.  He recently underwent TAVR.  He is followed by Dr. Aundra Dubin with cardiology.  He is on steroids for rheumatoid arthritis with recent weight gain of approximately 20 pounds.  He does not have any new back or abdominal pain.  He follows up with CT scan for his known abdominal aortic aneurysm.  Past Medical History:  Diagnosis Date   Abdominal aneurysm    Arthritis    "all over" (07/19/2016)   BPH (benign prostatic hypertrophy)    CAD (coronary artery disease)    Chicken pox    Chronic lower back pain    s/p surgical fusion   Depression    Diverticulosis    Esophageal stricture    GERD (gastroesophageal reflux disease)    Hepatitis B 1984   Hiatal hernia    History of radiation therapy 01/16/18- 03/05/18   Left Tonsil, 66 Gy in 33 fractions to high risk nodal echelons.    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 Burt Knack and Dr Cyndia Bent    Severe aortic stenosis    TIA (transient ischemic attack) 1990s   hx   tonsillar ca dx'd 11/2017   Tubular adenoma of colon 2009   Family History  Problem Relation Age of Onset   Heart disease Father 49       Living   Coronary artery disease Father        CABG   Alzheimer's disease Mother 54       Deceased   Arthritis Mother    Aneurysm Brother    Stomach cancer Maternal Uncle    Brain cancer Maternal Aunt        x2   Obesity Daughter        Had Bypass Sx   Past Surgical History:  Procedure Laterality Date   BACK SURGERY     CARDIAC CATHETERIZATION     CATARACT EXTRACTION W/ INTRAOCULAR LENS  IMPLANT,  BILATERAL Bilateral 01/2012 - 02/2012   CORONARY ATHERECTOMY N/A 04/27/2020   Procedure: CORONARY ATHERECTOMY;  Surgeon: Sherren Mocha, MD;  Location: [place] [facility];  Service: Cardiovascular;  Laterality: N/A;   CORONARY STENT INTERVENTION N/A 04/27/2020   Procedure: CORONARY STENT INTERVENTION;  Surgeon: Sherren Mocha, MD;  Location: [place] [facility];  Service: Cardiovascular;  Laterality: N/A;   DIRECT LARYNGOSCOPY Left 10/09/2017   Procedure: DIRECT LARYNGOSCOPY WITH BOPSY;  Surgeon: Jodi Marble, MD;  Location: [place];  Service: ENT;  Laterality: Left;   ESOPHAGOGASTRODUODENOSCOPY (EGD) WITH ESOPHAGEAL DILATION     "couple times" (07/19/2016)   ESOPHAGOSCOPY Left 10/09/2017   Procedure: ESOPHAGOSCOPY;  Surgeon: Jodi Marble, MD;  Location: [place];  Service: ENT;  Laterality: Left;   INTRAVASCULAR IMAGING/OCT N/A 04/27/2020   Procedure: INTRAVASCULAR IMAGING/OCT;  Surgeon: Sherren Mocha, MD;  Location: [place] [facility];  Service: Cardiovascular;  Laterality: N/A;   INTRAVASCULAR PRESSURE WIRE/FFR STUDY N/A 04/27/2020   Procedure: INTRAVASCULAR PRESSURE WIRE/FFR STUDY;  Surgeon: Sherren Mocha, MD;  Location: [facility]  INVASIVE [facility];  Service: Cardiovascular;  Laterality: N/A;   IR GASTROSTOMY TUBE MOD SED  01/08/2018   IR THORACENTESIS ASP PLEURAL SPACE W/IMG GUIDE  07/19/2016   LAPAROSCOPIC CHOLECYSTECTOMY  1994   LUMBAR [place] SURGERY  05/1996   L4-5; Dr. Claudean Kinds LAMINECTOMY/DECOMPRESSION MICRODISCECTOMY  10/2002   L3-4. Dr. Sherwood Gambler   MULTIPLE TOOTH EXTRACTIONS  1980s   PARTIAL GLOSSECTOMY  12/02/2017   Dr. Nicolette Bang- [facility]   pharyngoplasty for closure of tingue base defect  12/02/2017   Dr. Nicolette Bang- [place]  07/15/2016   POSTERIOR LUMBAR FUSION  09/1996   Ray cage, L4-5 Dr. Rita Ohara   PROSTATE BIOPSY  ~ 2017   radical tonsillectomy Left 12/02/2017   Dr. Nicolette Bang at [place] N/A 03/31/2020   Procedure: RIGHT HEART CATH AND CORONARY ANGIOGRAPHY;  Surgeon: Larey Dresser, MD;  Location: [place] [facility];  Service: Cardiovascular;  Laterality: N/A;   RIGID BRONCHOSCOPY Left 10/09/2017   Procedure: RIGID BRONCHOSCOPY;  Surgeon: Jodi Marble, MD;  Location: [place];  Service: ENT;  Laterality: Left;   TEE WITHOUT CARDIOVERSION N/A 06/07/2020   Procedure: TRANSESOPHAGEAL ECHOCARDIOGRAM (TEE);  Surgeon: Sherren Mocha, MD;  Location: [place];  Service: Open Heart Surgery;  Laterality: N/A;   TONSILLECTOMY     TRACHEOSTOMY  12/02/2017   Dr. Nicolette Bang- [facility]   ULTRASOUND GUIDANCE FOR VASCULAR ACCESS Right 06/07/2020   Procedure: ULTRASOUND GUIDANCE FOR VASCULAR ACCESS;  Surgeon: Sherren Mocha, MD;  Location: [place];  Service: Open Heart Surgery;  Laterality: Right;   VASCULAR SURGERY      Short Social History:  Social History   Tobacco Use   Smoking status: Former    Packs/day: 1.50    Years: 50.00    Pack years: 75.00    Types: Cigarettes    Quit date: 09/05/2017    Years since quitting: 3.2   Smokeless tobacco: Never   Tobacco comments:    smoked less than 1 ppd for 40+ years;   Substance Use Topics   Alcohol use: Yes    Alcohol/week: 0.0 standard drinks    Comment: Occasionally,not a heavy drinker     Allergies  Allergen Reactions   Celebrex [Celecoxib] Hives and Itching   Tape     Other reaction(s): Other (See Comments) Tears skin. Prefers paper tape    Current Outpatient Medications  Medication Sig Dispense Refill   allopurinol (ZYLOPRIM) 100 MG tablet Take 2 tablets (200 mg total) by mouth daily. 180 tablet 0   amLODipine (NORVASC) 10 MG tablet TAKE 1 TABLET(10 MG) BY MOUTH DAILY 90 tablet 3   aspirin 81 MG EC tablet Take 81 mg by mouth at bedtime.     atorvastatin (LIPITOR) 80 MG tablet Take 1 tablet (80 mg total) by mouth daily. 90 tablet 3   carvedilol (COREG) 12.5 MG tablet Take 1 tablet (12.5 mg total) by mouth 2  (two) times daily. 60 tablet 11   citalopram (CELEXA) 20 MG tablet Take 1 tablet (20 mg total) by mouth daily. 30 tablet 5   clopidogrel (PLAVIX) 75 MG tablet Take 75 mg by mouth daily.     ezetimibe (ZETIA) 10 MG tablet Take 1 tablet (10 mg total) by mouth daily. 30 tablet 11   fluticasone (FLONASE) 50 MCG/ACT nasal spray SHAKE LIQUID AND USE 2 SPRAYS IN EACH NOSTRIL DAILY AS NEEDED FOR RHINITIS OR ALLERGIES 16 g 11  furosemide (LASIX) 20 MG tablet Take 1 tablet (20 mg total) by mouth daily. 90 tablet 3   gabapentin (NEURONTIN) 300 MG capsule TAKE 1 CAPSULE(300 MG) BY MOUTH THREE TIMES DAILY 90 capsule 5   isosorbide mononitrate (IMDUR) 30 MG 24 hr tablet TAKE 1 TABLET(30 MG) BY MOUTH AT BEDTIME 90 tablet 1   lisinopril (ZESTRIL) 20 MG tablet TAKE 1 TABLET(10 MG) BY MOUTH DAILY 90 tablet 3   mirtazapine (REMERON) 30 MG tablet TAKE 1 TABLET BY MOUTH EVERY NIGHT AT BEDTIME 30 tablet 5   oxyCODONE (OXY IR/ROXICODONE) 5 MG immediate release tablet Take 1-2 tablets (5-10 mg total) by mouth every 6 (six) hours as needed for severe pain (to take 2 tabs ONLY when severe pain- which is not usual for pt). 120 tablet 0   pantoprazole (PROTONIX) 40 MG tablet Take 1 tablet (40 mg total) by mouth daily. 90 tablet 3   potassium chloride (KLOR-CON) 10 MEQ tablet Take 2 tablets (20 mEq total) by mouth daily. 180 tablet 3   predniSONE (DELTASONE) 1 MG tablet Take 4 tablets (4 mg total) by mouth daily with breakfast. 120 tablet 1   tamsulosin (FLOMAX) 0.4 MG CAPS capsule Take 0.4 mg by mouth at bedtime.     traMADol (ULTRAM) 50 MG tablet TAKE 2 TABLETS(100 MG) BY MOUTH TWICE DAILY 120 tablet 5   No current facility-administered medications for this visit.    Review of Systems  Constitutional: Positive for unexpected weight change.  HENT: HENT negative.  Eyes: Eyes negative.  Respiratory: Respiratory negative.  Cardiovascular: Cardiovascular negative.  GI: Gastrointestinal negative.  Musculoskeletal:  Musculoskeletal negative.  Skin: Skin negative.  Neurological: Neurological negative. Hematologic: Hematologic/lymphatic negative.  Psychiatric: Psychiatric negative.       Objective:  Objective   Vitals:   11/30/20 1106  BP: 136/72  Pulse: 83  Resp: 20  Temp: 98.3 F (36.8 C)  SpO2: 92%  Weight: 182 lb 14.4 oz (83 kg)  Height: 5\' 6"  (1.676 m)   Body mass index is 29.52 kg/m.  Physical Exam HENT:     Head: Normocephalic.     Nose:     Comments: Wearing a mask Eyes:     Pupils: Pupils are equal, round, and reactive to light.  Cardiovascular:     Pulses:          Popliteal pulses are 2+ on the right side and 2+ on the left side.       Posterior tibial pulses are 2+ on the right side and 2+ on the left side.  Pulmonary:     Effort: Pulmonary effort is normal.  Abdominal:     General: Abdomen is flat.     Palpations: Abdomen is soft. There is no mass.  Musculoskeletal:     Cervical back: Normal range of motion and neck supple.     Right lower leg: No edema.     Left lower leg: No edema.  Skin:    General: Skin is warm.     Capillary Refill: Capillary refill takes less than 2 seconds.  Neurological:     General: No focal deficit present.     Mental Status: He is alert.  Psychiatric:        Mood and Affect: Mood normal.        Behavior: Behavior normal.        Thought Content: Thought content normal.        Judgment: Judgment normal.    Data: CT IMPRESSION: VASCULAR  1. Slight interval increased size of previously visualized fusiform infrarenal abdominal aortic aneurysm which now measures up to 5.2 cm (previously 4.9 cm measurements). Recommend referral to a Vascular Specialist if not already established. This recommendation follows ACR consensus guidelines: White Paper of the ACR Incidental Findings Committee II on Vascular Findings. J Am Coll Radiol 2013; 10:789-794. 2. 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       Assessment/Plan:     80 year old male with abdominal aortic aneurysm status post TAVR.  I have measured his aneurysm at 5.5 cm today by my measurements was 5.2 by CT scan read.  We have discussed his options being continued watchful waiting versus repair.  He appears to be a candidate for endovascular repair.  I have discussed the specifics of the operation including the need for bilateral common femoral access, placement of stent, need for general anesthesia, plan for 1 night hospital stay.  We discussed the risks of the operation including to his heart given his extensive history, risk of general anesthesia, risk of blood loss requiring transfusion, risk of need for conversion to open operation, risk of groin issue requiring open common femoral artery repair and/or repair of his external or common iliac arteries.  He demonstrates good understanding.  I will have him cleared by cardiology we will schedule him in the near future.     Waynetta Sandy MD [facility]

## 2020-12-03 ENCOUNTER — Encounter: Payer: Self-pay | Admitting: Family Medicine

## 2020-12-03 ENCOUNTER — Other Ambulatory Visit: Payer: Self-pay

## 2020-12-03 ENCOUNTER — Inpatient Hospital Stay (HOSPITAL_COMMUNITY)
Admission: EM | Admit: 2020-12-03 | Discharge: 2020-12-08 | DRG: 521 | Disposition: A | Discharge status: Home Health | Payer: Medicare Other | Attending: Internal Medicine | Admitting: Internal Medicine

## 2020-12-03 ENCOUNTER — Emergency Department (HOSPITAL_COMMUNITY): Payer: Medicare Other

## 2020-12-03 ENCOUNTER — Inpatient Hospital Stay (HOSPITAL_COMMUNITY): Payer: Medicare Other

## 2020-12-03 ENCOUNTER — Encounter (HOSPITAL_COMMUNITY): Payer: Self-pay

## 2020-12-03 DIAGNOSIS — G894 Chronic pain syndrome: Secondary | ICD-10-CM | POA: Diagnosis present

## 2020-12-03 DIAGNOSIS — W010XXA Fall on same level from slipping, tripping and stumbling without subsequent striking against object, initial encounter: Secondary | ICD-10-CM | POA: Diagnosis present

## 2020-12-03 DIAGNOSIS — S299XXA Unspecified injury of thorax, initial encounter: Secondary | ICD-10-CM | POA: Diagnosis not present

## 2020-12-03 DIAGNOSIS — Z923 Personal history of irradiation: Secondary | ICD-10-CM | POA: Diagnosis not present

## 2020-12-03 DIAGNOSIS — I248 Other forms of acute ischemic heart disease: Secondary | ICD-10-CM | POA: Diagnosis not present

## 2020-12-03 DIAGNOSIS — Z955 Presence of coronary angioplasty implant and graft: Secondary | ICD-10-CM

## 2020-12-03 DIAGNOSIS — Z7902 Long term (current) use of antithrombotics/antiplatelets: Secondary | ICD-10-CM

## 2020-12-03 DIAGNOSIS — I2489 Other forms of acute ischemic heart disease: Secondary | ICD-10-CM

## 2020-12-03 DIAGNOSIS — Z981 Arthrodesis status: Secondary | ICD-10-CM

## 2020-12-03 DIAGNOSIS — E538 Deficiency of other specified B group vitamins: Secondary | ICD-10-CM | POA: Diagnosis present

## 2020-12-03 DIAGNOSIS — K219 Gastro-esophageal reflux disease without esophagitis: Secondary | ICD-10-CM | POA: Diagnosis present

## 2020-12-03 DIAGNOSIS — I10 Essential (primary) hypertension: Secondary | ICD-10-CM | POA: Diagnosis not present

## 2020-12-03 DIAGNOSIS — I959 Hypotension, unspecified: Secondary | ICD-10-CM | POA: Diagnosis present

## 2020-12-03 DIAGNOSIS — D72829 Elevated white blood cell count, unspecified: Secondary | ICD-10-CM | POA: Diagnosis present

## 2020-12-03 DIAGNOSIS — F418 Other specified anxiety disorders: Secondary | ICD-10-CM | POA: Diagnosis not present

## 2020-12-03 DIAGNOSIS — Z8673 Personal history of transient ischemic attack (TIA), and cerebral infarction without residual deficits: Secondary | ICD-10-CM

## 2020-12-03 DIAGNOSIS — F32A Depression, unspecified: Secondary | ICD-10-CM | POA: Diagnosis not present

## 2020-12-03 DIAGNOSIS — S46012A Strain of muscle(s) and tendon(s) of the rotator cuff of left shoulder, initial encounter: Secondary | ICD-10-CM | POA: Diagnosis not present

## 2020-12-03 DIAGNOSIS — R0902 Hypoxemia: Secondary | ICD-10-CM | POA: Diagnosis not present

## 2020-12-03 DIAGNOSIS — T1490XA Injury, unspecified, initial encounter: Secondary | ICD-10-CM

## 2020-12-03 DIAGNOSIS — M25512 Pain in left shoulder: Secondary | ICD-10-CM | POA: Diagnosis not present

## 2020-12-03 DIAGNOSIS — S72002A Fracture of unspecified part of neck of left femur, initial encounter for closed fracture: Secondary | ICD-10-CM

## 2020-12-03 DIAGNOSIS — D62 Acute posthemorrhagic anemia: Secondary | ICD-10-CM

## 2020-12-03 DIAGNOSIS — R9082 White matter disease, unspecified: Secondary | ICD-10-CM | POA: Diagnosis not present

## 2020-12-03 DIAGNOSIS — R778 Other specified abnormalities of plasma proteins: Secondary | ICD-10-CM

## 2020-12-03 DIAGNOSIS — R0602 Shortness of breath: Secondary | ICD-10-CM | POA: Diagnosis not present

## 2020-12-03 DIAGNOSIS — M353 Polymyalgia rheumatica: Secondary | ICD-10-CM | POA: Diagnosis not present

## 2020-12-03 DIAGNOSIS — Z8249 Family history of ischemic heart disease and other diseases of the circulatory system: Secondary | ICD-10-CM

## 2020-12-03 DIAGNOSIS — Z419 Encounter for procedure for purposes other than remedying health state, unspecified: Secondary | ICD-10-CM

## 2020-12-03 DIAGNOSIS — I7143 Infrarenal abdominal aortic aneurysm, without rupture: Secondary | ICD-10-CM | POA: Diagnosis present

## 2020-12-03 DIAGNOSIS — S72042A Displaced fracture of base of neck of left femur, initial encounter for closed fracture: Secondary | ICD-10-CM | POA: Diagnosis not present

## 2020-12-03 DIAGNOSIS — J9621 Acute and chronic respiratory failure with hypoxia: Secondary | ICD-10-CM | POA: Diagnosis not present

## 2020-12-03 DIAGNOSIS — I5032 Chronic diastolic (congestive) heart failure: Secondary | ICD-10-CM | POA: Insufficient documentation

## 2020-12-03 DIAGNOSIS — S3991XA Unspecified injury of abdomen, initial encounter: Secondary | ICD-10-CM | POA: Diagnosis not present

## 2020-12-03 DIAGNOSIS — Y92009 Unspecified place in unspecified non-institutional (private) residence as the place of occurrence of the external cause: Secondary | ICD-10-CM | POA: Diagnosis not present

## 2020-12-03 DIAGNOSIS — I2510 Atherosclerotic heart disease of native coronary artery without angina pectoris: Secondary | ICD-10-CM | POA: Diagnosis present

## 2020-12-03 DIAGNOSIS — Z7982 Long term (current) use of aspirin: Secondary | ICD-10-CM

## 2020-12-03 DIAGNOSIS — E785 Hyperlipidemia, unspecified: Secondary | ICD-10-CM | POA: Diagnosis present

## 2020-12-03 DIAGNOSIS — S72012A Unspecified intracapsular fracture of left femur, initial encounter for closed fracture: Principal | ICD-10-CM | POA: Diagnosis present

## 2020-12-03 DIAGNOSIS — Z8546 Personal history of malignant neoplasm of prostate: Secondary | ICD-10-CM

## 2020-12-03 DIAGNOSIS — G319 Degenerative disease of nervous system, unspecified: Secondary | ICD-10-CM | POA: Diagnosis not present

## 2020-12-03 DIAGNOSIS — Z01810 Encounter for preprocedural cardiovascular examination: Secondary | ICD-10-CM | POA: Diagnosis not present

## 2020-12-03 DIAGNOSIS — M545 Low back pain, unspecified: Secondary | ICD-10-CM | POA: Diagnosis not present

## 2020-12-03 DIAGNOSIS — Z471 Aftercare following joint replacement surgery: Secondary | ICD-10-CM | POA: Diagnosis not present

## 2020-12-03 DIAGNOSIS — D519 Vitamin B12 deficiency anemia, unspecified: Secondary | ICD-10-CM | POA: Diagnosis not present

## 2020-12-03 DIAGNOSIS — R7989 Other specified abnormal findings of blood chemistry: Secondary | ICD-10-CM

## 2020-12-03 DIAGNOSIS — R2681 Unsteadiness on feet: Secondary | ICD-10-CM | POA: Diagnosis not present

## 2020-12-03 DIAGNOSIS — Z85818 Personal history of malignant neoplasm of other sites of lip, oral cavity, and pharynx: Secondary | ICD-10-CM | POA: Diagnosis not present

## 2020-12-03 DIAGNOSIS — N400 Enlarged prostate without lower urinary tract symptoms: Secondary | ICD-10-CM | POA: Diagnosis present

## 2020-12-03 DIAGNOSIS — Z96642 Presence of left artificial hip joint: Secondary | ICD-10-CM | POA: Diagnosis not present

## 2020-12-03 DIAGNOSIS — W19XXXA Unspecified fall, initial encounter: Secondary | ICD-10-CM | POA: Diagnosis not present

## 2020-12-03 DIAGNOSIS — Z20822 Contact with and (suspected) exposure to covid-19: Secondary | ICD-10-CM | POA: Diagnosis present

## 2020-12-03 DIAGNOSIS — Z952 Presence of prosthetic heart valve: Secondary | ICD-10-CM | POA: Diagnosis not present

## 2020-12-03 DIAGNOSIS — J329 Chronic sinusitis, unspecified: Secondary | ICD-10-CM | POA: Diagnosis not present

## 2020-12-03 DIAGNOSIS — D509 Iron deficiency anemia, unspecified: Secondary | ICD-10-CM | POA: Diagnosis present

## 2020-12-03 DIAGNOSIS — M25552 Pain in left hip: Secondary | ICD-10-CM | POA: Diagnosis not present

## 2020-12-03 DIAGNOSIS — D649 Anemia, unspecified: Secondary | ICD-10-CM | POA: Diagnosis not present

## 2020-12-03 DIAGNOSIS — M1712 Unilateral primary osteoarthritis, left knee: Secondary | ICD-10-CM | POA: Diagnosis not present

## 2020-12-03 DIAGNOSIS — J449 Chronic obstructive pulmonary disease, unspecified: Secondary | ICD-10-CM | POA: Diagnosis not present

## 2020-12-03 DIAGNOSIS — Z7952 Long term (current) use of systemic steroids: Secondary | ICD-10-CM

## 2020-12-03 DIAGNOSIS — F419 Anxiety disorder, unspecified: Secondary | ICD-10-CM | POA: Diagnosis not present

## 2020-12-03 DIAGNOSIS — Z87891 Personal history of nicotine dependence: Secondary | ICD-10-CM

## 2020-12-03 DIAGNOSIS — S4992XA Unspecified injury of left shoulder and upper arm, initial encounter: Secondary | ICD-10-CM | POA: Diagnosis not present

## 2020-12-03 DIAGNOSIS — J9601 Acute respiratory failure with hypoxia: Secondary | ICD-10-CM

## 2020-12-03 DIAGNOSIS — I21A1 Myocardial infarction type 2: Secondary | ICD-10-CM | POA: Diagnosis not present

## 2020-12-03 DIAGNOSIS — Z886 Allergy status to analgesic agent status: Secondary | ICD-10-CM

## 2020-12-03 DIAGNOSIS — Z79899 Other long term (current) drug therapy: Secondary | ICD-10-CM

## 2020-12-03 DIAGNOSIS — Z91048 Other nonmedicinal substance allergy status: Secondary | ICD-10-CM

## 2020-12-03 LAB — COMPREHENSIVE METABOLIC PANEL
ALT: 14 U/L (ref 0–44)
AST: 19 U/L (ref 15–41)
Albumin: 3.1 g/dL — ABNORMAL LOW (ref 3.5–5.0)
Alkaline Phosphatase: 99 U/L (ref 38–126)
Anion gap: 8 (ref 5–15)
BUN: 17 mg/dL (ref 8–23)
CO2: 29 mmol/L (ref 22–32)
Calcium: 8.4 mg/dL — ABNORMAL LOW (ref 8.9–10.3)
Chloride: 98 mmol/L (ref 98–111)
Creatinine, Ser: 0.9 mg/dL (ref 0.61–1.24)
GFR, Estimated: >60 mL/min (ref >60)
Glucose, Bld: 129 mg/dL — ABNORMAL HIGH (ref 70–99)
Potassium: 3.6 mmol/L (ref 3.5–5.1)
Sodium: 135 mmol/L (ref 135–145)
Total Bilirubin: 1 mg/dL (ref 0.3–1.2)
Total Protein: 6 g/dL — ABNORMAL LOW (ref 6.5–8.1)

## 2020-12-03 LAB — CBC
HCT: 32.6 % — ABNORMAL LOW (ref 39.0–52.0)
Hemoglobin: 9.7 g/dL — ABNORMAL LOW (ref 13.0–17.0)
MCH: 24.1 pg — ABNORMAL LOW (ref 26.0–34.0)
MCHC: 29.8 g/dL — ABNORMAL LOW (ref 30.0–36.0)
MCV: 80.9 fL (ref 80.0–100.0)
Platelets: 159 10*3/uL (ref 150–400)
RBC: 4.03 MIL/uL — ABNORMAL LOW (ref 4.22–5.81)
RDW: 15.5 % (ref 11.5–15.5)
WBC: 11.1 10*3/uL — ABNORMAL HIGH (ref 4.0–10.5)
nRBC: 0 % (ref 0.0–0.2)

## 2020-12-03 LAB — URINALYSIS, ROUTINE W REFLEX MICROSCOPIC
Bilirubin Urine: NEGATIVE
Glucose, UA: NEGATIVE mg/dL
Hgb urine dipstick: NEGATIVE
Ketones, ur: NEGATIVE mg/dL
Leukocytes,Ua: NEGATIVE
Nitrite: NEGATIVE
Protein, ur: NEGATIVE mg/dL
Specific Gravity, Urine: 1.014 (ref 1.005–1.030)
pH: 6 (ref 5.0–8.0)

## 2020-12-03 LAB — SAMPLE TO BLOOD BANK

## 2020-12-03 LAB — I-STAT CHEM 8, ED
BUN: 17 mg/dL (ref 8–23)
Calcium, Ion: 1.06 mmol/L — ABNORMAL LOW (ref 1.15–1.40)
Chloride: 97 mmol/L — ABNORMAL LOW (ref 98–111)
Creatinine, Ser: 0.8 mg/dL (ref 0.61–1.24)
Glucose, Bld: 127 mg/dL — ABNORMAL HIGH (ref 70–99)
HCT: 31 % — ABNORMAL LOW (ref 39.0–52.0)
Hemoglobin: 10.5 g/dL — ABNORMAL LOW (ref 13.0–17.0)
Potassium: 3.6 mmol/L (ref 3.5–5.1)
Sodium: 135 mmol/L (ref 135–145)
TCO2: 29 mmol/L (ref 22–32)

## 2020-12-03 LAB — TROPONIN I (HIGH SENSITIVITY)
Troponin I (High Sensitivity): 33 ng/L — ABNORMAL HIGH (ref <18)
Troponin I (High Sensitivity): 111 ng/L (ref <18)
Troponin I (High Sensitivity): 212 ng/L (ref <18)
Troponin I (High Sensitivity): 347 ng/L (ref <18)

## 2020-12-03 LAB — RESP PANEL BY RT-PCR (FLU A&B, COVID) ARPGX2
Influenza A by PCR: NEGATIVE
Influenza B by PCR: NEGATIVE
SARS Coronavirus 2 by RT PCR: NEGATIVE

## 2020-12-03 LAB — ETHANOL: Alcohol, Ethyl (B): <10 mg/dL (ref <10)

## 2020-12-03 LAB — PROTIME-INR
INR: 1 (ref 0.8–1.2)
Prothrombin Time: 13.3 seconds (ref 11.4–15.2)

## 2020-12-03 LAB — LACTIC ACID, PLASMA: Lactic Acid, Venous: 0.9 mmol/L (ref 0.5–1.9)

## 2020-12-03 MED ORDER — ASPIRIN EC 81 MG PO TBEC
81.0000 mg | DELAYED_RELEASE_TABLET | Freq: Every day | ORAL | Status: DC
  Administered 2020-12-03: 81 mg via ORAL
  Filled 2020-12-03: qty 1

## 2020-12-03 MED ORDER — CARVEDILOL 12.5 MG PO TABS
12.5000 mg | ORAL_TABLET | Freq: Two times a day (BID) | ORAL | Status: DC
  Administered 2020-12-03 – 2020-12-08 (×10): 12.5 mg via ORAL
  Filled 2020-12-03: qty 1
  Filled 2020-12-03: qty 4
  Filled 2020-12-03 (×8): qty 1

## 2020-12-03 MED ORDER — EZETIMIBE 10 MG PO TABS
10.0000 mg | ORAL_TABLET | Freq: Every day | ORAL | Status: DC
  Administered 2020-12-03 – 2020-12-08 (×5): 10 mg via ORAL
  Filled 2020-12-03 (×5): qty 1

## 2020-12-03 MED ORDER — TAMSULOSIN HCL 0.4 MG PO CAPS
0.4000 mg | ORAL_CAPSULE | Freq: Every day | ORAL | Status: DC
  Administered 2020-12-03 – 2020-12-07 (×5): 0.4 mg via ORAL
  Filled 2020-12-03 (×5): qty 1

## 2020-12-03 MED ORDER — SENNOSIDES-DOCUSATE SODIUM 8.6-50 MG PO TABS
1.0000 | ORAL_TABLET | Freq: Every day | ORAL | Status: DC
  Administered 2020-12-03 – 2020-12-05 (×3): 1 via ORAL
  Filled 2020-12-03 (×4): qty 1

## 2020-12-03 MED ORDER — FENTANYL CITRATE PF 50 MCG/ML IJ SOSY
50.0000 ug | PREFILLED_SYRINGE | Freq: Once | INTRAMUSCULAR | Status: AC
  Administered 2020-12-03: 50 ug via INTRAVENOUS
  Filled 2020-12-03: qty 1

## 2020-12-03 MED ORDER — PREDNISONE 1 MG PO TABS
4.0000 mg | ORAL_TABLET | Freq: Every day | ORAL | Status: DC
  Administered 2020-12-05 – 2020-12-08 (×4): 4 mg via ORAL
  Filled 2020-12-03 (×5): qty 4

## 2020-12-03 MED ORDER — FUROSEMIDE 20 MG PO TABS
20.0000 mg | ORAL_TABLET | Freq: Every day | ORAL | Status: DC
  Administered 2020-12-03 – 2020-12-08 (×5): 20 mg via ORAL
  Filled 2020-12-03 (×5): qty 1

## 2020-12-03 MED ORDER — PANTOPRAZOLE SODIUM 40 MG PO TBEC
40.0000 mg | DELAYED_RELEASE_TABLET | Freq: Every day | ORAL | Status: DC
  Administered 2020-12-03 – 2020-12-08 (×5): 40 mg via ORAL
  Filled 2020-12-03 (×5): qty 1

## 2020-12-03 MED ORDER — CITALOPRAM HYDROBROMIDE 20 MG PO TABS
20.0000 mg | ORAL_TABLET | Freq: Every day | ORAL | Status: DC
  Administered 2020-12-03 – 2020-12-08 (×5): 20 mg via ORAL
  Filled 2020-12-03: qty 2
  Filled 2020-12-03 (×4): qty 1

## 2020-12-03 MED ORDER — FENTANYL CITRATE PF 50 MCG/ML IJ SOSY
25.0000 ug | PREFILLED_SYRINGE | INTRAMUSCULAR | Status: DC | PRN
  Administered 2020-12-03 – 2020-12-04 (×6): 25 ug via INTRAVENOUS
  Filled 2020-12-03 (×7): qty 1

## 2020-12-03 MED ORDER — MIRTAZAPINE 15 MG PO TABS
30.0000 mg | ORAL_TABLET | Freq: Every day | ORAL | Status: DC
  Administered 2020-12-03 – 2020-12-07 (×5): 30 mg via ORAL
  Filled 2020-12-03 (×2): qty 2
  Filled 2020-12-03: qty 1
  Filled 2020-12-03 (×3): qty 2

## 2020-12-03 MED ORDER — ENOXAPARIN SODIUM 40 MG/0.4ML IJ SOSY
40.0000 mg | PREFILLED_SYRINGE | INTRAMUSCULAR | Status: DC
  Administered 2020-12-03: 40 mg via SUBCUTANEOUS
  Filled 2020-12-03: qty 0.4

## 2020-12-03 MED ORDER — CLOPIDOGREL BISULFATE 75 MG PO TABS
75.0000 mg | ORAL_TABLET | Freq: Every day | ORAL | Status: DC
  Administered 2020-12-03: 75 mg via ORAL
  Filled 2020-12-03: qty 1

## 2020-12-03 MED ORDER — ATORVASTATIN CALCIUM 40 MG PO TABS
80.0000 mg | ORAL_TABLET | Freq: Every day | ORAL | Status: DC
  Administered 2020-12-03 – 2020-12-08 (×5): 80 mg via ORAL
  Filled 2020-12-03 (×2): qty 2
  Filled 2020-12-03: qty 1
  Filled 2020-12-03 (×2): qty 2

## 2020-12-03 MED ORDER — POTASSIUM CHLORIDE ER 10 MEQ PO TBCR
20.0000 meq | EXTENDED_RELEASE_TABLET | Freq: Every day | ORAL | Status: DC
  Administered 2020-12-03 – 2020-12-08 (×5): 20 meq via ORAL
  Filled 2020-12-03 (×10): qty 2

## 2020-12-03 MED ORDER — AMLODIPINE BESYLATE 10 MG PO TABS
10.0000 mg | ORAL_TABLET | Freq: Every day | ORAL | Status: DC
  Administered 2020-12-03 – 2020-12-06 (×3): 10 mg via ORAL
  Filled 2020-12-03: qty 1
  Filled 2020-12-03: qty 2
  Filled 2020-12-03: qty 1

## 2020-12-03 MED ORDER — OXYCODONE-ACETAMINOPHEN 5-325 MG PO TABS
1.0000 | ORAL_TABLET | Freq: Once | ORAL | Status: AC
  Administered 2020-12-03: 1 via ORAL
  Filled 2020-12-03: qty 1

## 2020-12-03 MED ORDER — GABAPENTIN 300 MG PO CAPS
300.0000 mg | ORAL_CAPSULE | Freq: Three times a day (TID) | ORAL | Status: DC
  Administered 2020-12-03 – 2020-12-08 (×14): 300 mg via ORAL
  Filled 2020-12-03 (×14): qty 1

## 2020-12-03 MED ORDER — ALLOPURINOL 100 MG PO TABS
200.0000 mg | ORAL_TABLET | Freq: Every day | ORAL | Status: DC
  Administered 2020-12-03 – 2020-12-08 (×5): 200 mg via ORAL
  Filled 2020-12-03 (×5): qty 2

## 2020-12-03 MED ORDER — IOHEXOL 350 MG/ML SOLN
100.0000 mL | Freq: Once | INTRAVENOUS | Status: AC | PRN
  Administered 2020-12-03: 100 mL via INTRAVENOUS

## 2020-12-03 MED ORDER — CHLORHEXIDINE GLUCONATE 4 % EX LIQD
60.0000 mL | Freq: Once | CUTANEOUS | Status: AC
  Administered 2020-12-04: 4 via TOPICAL

## 2020-12-03 MED ORDER — OXYCODONE HCL 5 MG PO TABS
5.0000 mg | ORAL_TABLET | ORAL | Status: DC | PRN
  Administered 2020-12-03 – 2020-12-04 (×4): 10 mg via ORAL
  Filled 2020-12-03 (×4): qty 2

## 2020-12-03 MED ORDER — CEFAZOLIN SODIUM-DEXTROSE 2-4 GM/100ML-% IV SOLN
2.0000 g | INTRAVENOUS | Status: AC
  Administered 2020-12-04: 2 g via INTRAVENOUS
  Filled 2020-12-03 (×2): qty 100

## 2020-12-03 MED ORDER — FLUTICASONE PROPIONATE 50 MCG/ACT NA SUSP: 2.0000 | Freq: Every day | NASAL | Status: DC | PRN

## 2020-12-03 NOTE — Progress Notes (Signed)
Wife called and spoke to patient while I was in room on personal cell phone

## 2020-12-03 NOTE — ED Notes (Signed)
Patient transported to X-ray 

## 2020-12-03 NOTE — ED Notes (Signed)
Orthopedics at bedside 

## 2020-12-03 NOTE — ED Notes (Signed)
Received verbal report from [place] B RN at this time

## 2020-12-03 NOTE — ED Provider Notes (Signed)
[facility] EMERGENCY DEPARTMENT Provider Note   CSN: [id number] Arrival date & time: 12/03/20  0701     History Chief Complaint  Patient presents with   Fall   Back Pain    Brandon [name] is a 80 y.o. 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  Patient states that last night at approximately midnight he slipped on a dog bed and fell onto the sacrum.  He attempted to stand up and then had an additional episode of syncope.  He arrives alert and oriented answering questions appropriately but arrives hypoxic to 85% on room air.  He does not wear oxygen at home.  The patient is currently weaning off of chronic steroids and does take aspirin and Plavix.  Positive head strike.  Unsure of loss of consciousness.  Endorses left shoulder pain, left hip pain, low back pain.  Denies chest pain, shortness of breath, headache, neck pain, fever or other systemic symptoms.  HPI     Past Medical History:  Diagnosis Date   Abdominal aneurysm    Arthritis    "all over" (07/19/2016)   BPH (benign prostatic hypertrophy)    CAD (coronary artery disease)    Chicken pox    Chronic lower back pain    s/p surgical fusion   Depression    Diverticulosis    Esophageal stricture    GERD (gastroesophageal reflux disease)    Hepatitis B 1984   Hiatal hernia    History of radiation therapy 01/16/18- 03/05/18   Left Tonsil, 66 Gy in 33 fractions to high risk nodal echelons.    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   Hepatitis B core antibody positive 11/01/2020   Benign prostatic hyperplasia 11/01/2020   Chronic obstructive pulmonary disease ([place]) 07/20/2020   History of placement of stent in LAD coronary artery 07/20/2020   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   TIA (transient ischemic attack)    Esophageal stricture    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   Primary osteoarthritis of both hands 01/11/2020   Piriformis syndrome of both sides 09/23/2019   Pain of both shoulder joints 07/29/2019   Sciatica associated with disorder of lumbar spine 04/24/2019   Primary osteoarthritis involving multiple joints 04/24/2019   Chronic pain syndrome 04/24/2019   Anxiety and depression 04/15/2018   Oropharyngeal dysphagia 03/11/2018   Cancer of tonsillar fossa ([place]) 09/20/2017   Liver abscess 07/10/2016   Aneurysm of infrarenal abdominal aorta 07/10/2016   Hypochromic anemia 07/10/2016   Essential hypertension 05/19/2013   Osteoarthritis 05/29/2009   Psoriasis 01/13/2009   Coronary artery disease 09/15/2008   Severe aortic stenosis 01/28/2008  Diverticulosis of colon 08/27/2007   History of benign prostatic hyperplasia 08/27/2007   Hyperlipidemia 03/19/2007   Gastroesophageal reflux disease 03/19/2007    Past Surgical History:  Procedure Laterality Date   BACK SURGERY     CARDIAC CATHETERIZATION     CATARACT EXTRACTION W/ INTRAOCULAR LENS  IMPLANT, BILATERAL Bilateral 01/2012 - 02/2012   CORONARY ATHERECTOMY N/A 04/27/2020   Procedure: CORONARY ATHERECTOMY;  Surgeon: Sherren Mocha, MD;  Location: [facility];  Service: Cardiovascular;  Laterality:  N/A;   CORONARY STENT INTERVENTION N/A 04/27/2020   Procedure: CORONARY STENT INTERVENTION;  Surgeon: Sherren Mocha, MD;  Location: [facility];  Service: Cardiovascular;  Laterality: N/A;   DIRECT LARYNGOSCOPY Left 10/09/2017   Procedure: DIRECT LARYNGOSCOPY WITH BOPSY;  Surgeon: Jodi Marble, MD;  Location: [facility];  Service: ENT;  Laterality: Left;   ESOPHAGOGASTRODUODENOSCOPY (EGD) WITH ESOPHAGEAL DILATION     "couple times" (07/19/2016)   ESOPHAGOSCOPY Left 10/09/2017   Procedure: ESOPHAGOSCOPY;  Surgeon: Jodi Marble, MD;  Location: [facility];  Service: ENT;  Laterality: Left;   INTRAVASCULAR IMAGING/OCT N/A 04/27/2020   Procedure: INTRAVASCULAR IMAGING/OCT;  Surgeon: Sherren Mocha, MD;  Location: [facility];  Service: Cardiovascular;  Laterality: N/A;   INTRAVASCULAR PRESSURE WIRE/FFR STUDY N/A 04/27/2020   Procedure: INTRAVASCULAR PRESSURE WIRE/FFR STUDY;  Surgeon: Sherren Mocha, MD;  Location: [facility];  Service: Cardiovascular;  Laterality: N/A;   IR GASTROSTOMY TUBE MOD SED  01/08/2018   IR THORACENTESIS ASP PLEURAL SPACE W/IMG GUIDE  07/19/2016   LAPAROSCOPIC CHOLECYSTECTOMY  1994   LUMBAR [place] SURGERY  05/1996   L4-5; Dr. Claudean Kinds LAMINECTOMY/DECOMPRESSION MICRODISCECTOMY  10/2002   L3-4. Dr. Sherwood Gambler   MULTIPLE TOOTH EXTRACTIONS  1980s   PARTIAL GLOSSECTOMY  12/02/2017   Dr. Nicolette [facility]- [facility]   pharyngoplasty for closure of tingue base defect  12/02/2017   Dr. Nicolette Bang- [facility]  07/15/2016   POSTERIOR LUMBAR FUSION  09/1996   Ray cage, L4-5 Dr. Rita Ohara   PROSTATE BIOPSY  ~ 2017   radical tonsillectomy Left 12/02/2017   Dr. Nicolette Bang at [facility] N/A 03/31/2020   Procedure: RIGHT HEART CATH AND CORONARY ANGIOGRAPHY;  Surgeon: Larey Dresser, MD;  Location: [facility];  Service: Cardiovascular;  Laterality: N/A;   RIGID BRONCHOSCOPY Left  10/09/2017   Procedure: RIGID BRONCHOSCOPY;  Surgeon: Jodi Marble, MD;  Location: [facility];  Service: ENT;  Laterality: Left;   TEE WITHOUT CARDIOVERSION N/A 06/07/2020   Procedure: TRANSESOPHAGEAL ECHOCARDIOGRAM (TEE);  Surgeon: Sherren Mocha, MD;  Location: [facility];  Service: Open Heart Surgery;  Laterality: N/A;   TONSILLECTOMY     TRACHEOSTOMY  12/02/2017   Dr. Nicolette Bang- [facility]   ULTRASOUND GUIDANCE FOR VASCULAR ACCESS Right 06/07/2020   Procedure: ULTRASOUND GUIDANCE FOR VASCULAR ACCESS;  Surgeon: Sherren Mocha, MD;  Location: [facility];  Service: Open Heart Surgery;  Laterality: Right;   VASCULAR SURGERY         Family History  Problem Relation Age of Onset   Heart disease Father 51       Living   Coronary artery disease Father        CABG   Alzheimer's disease Mother 60       Deceased   Arthritis Mother    Aneurysm Brother    Stomach cancer Maternal Uncle    Brain cancer Maternal Aunt  x2   Obesity Daughter        Had Bypass Sx    Social History   Tobacco Use   Smoking status: Former    Packs/day: 1.50    Years: 50.00    Pack years: 75.00    Types: Cigarettes    Quit date: 09/05/2017    Years since quitting: 3.2   Smokeless tobacco: Never   Tobacco comments:    smoked less than 1 ppd for 40+ years;   Vaping Use   Vaping Use: Never used  Substance Use Topics   Alcohol use: Yes    Alcohol/week: 0.0 standard drinks    Comment: Occasionally,not a heavy drinker    Drug use: Yes    Types: Oxycodone    Home Medications Prior to Admission medications   Medication Sig Start Date End Date Taking? Authorizing Provider  allopurinol (ZYLOPRIM) 100 MG tablet Take 2 tablets (200 mg total) by mouth daily. 09/12/20  Yes Ofilia Neas, PA-C  amLODipine (NORVASC) 10 MG tablet TAKE 1 TABLET(10 MG) BY MOUTH DAILY Patient taking differently: Take 10 mg by mouth daily. 07/01/20  Yes Larey Dresser, MD  aspirin 81 MG EC tablet Take 81 mg by mouth at bedtime.    Yes [provider]  atorvastatin (LIPITOR) 80 MG tablet Take 1 tablet (80 mg total) by mouth daily. 04/01/20  Yes Larey Dresser, MD  carvedilol (COREG) 12.5 MG tablet Take 1 tablet (12.5 mg total) by mouth 2 (two) times daily. 08/23/20 08/23/21 Yes Larey Dresser, MD  citalopram (CELEXA) 20 MG tablet Take 1 tablet (20 mg total) by mouth daily. 04/25/20  Yes Lovorn, Jinny Blossom, MD  clopidogrel (PLAVIX) 75 MG tablet Take 75 mg by mouth daily.   Yes [provider]  ezetimibe (ZETIA) 10 MG tablet Take 1 tablet (10 mg total) by mouth daily. 05/23/20 05/23/21 Yes Larey Dresser, MD  fluticasone (FLONASE) 50 MCG/ACT nasal spray SHAKE LIQUID AND USE 2 SPRAYS IN EACH NOSTRIL DAILY AS NEEDED FOR RHINITIS OR ALLERGIES Patient taking differently: Place 2 sprays into both nostrils daily as needed for allergies. 11/14/20  Yes Haydee Salter, MD  furosemide (LASIX) 20 MG tablet Take 1 tablet (20 mg total) by mouth daily. 11/25/20 02/23/21 Yes Larey Dresser, MD  gabapentin (NEURONTIN) 300 MG capsule TAKE 1 CAPSULE(300 MG) BY MOUTH THREE TIMES DAILY Patient taking differently: Take 300 mg by mouth 3 (three) times daily. TAKE 1 CAPSULE(300 MG) BY MOUTH THREE TIMES DAILY 10/07/20  Yes Lovorn, Jinny Blossom, MD  isosorbide mononitrate (IMDUR) 30 MG 24 hr tablet TAKE 1 TABLET(30 MG) BY MOUTH AT BEDTIME Patient taking differently: Take 30 mg by mouth daily. 01/18/20  Yes Brunetta Jeans, PA-C  lisinopril (ZESTRIL) 20 MG tablet TAKE 1 TABLET(10 MG) BY MOUTH DAILY 11/25/20  Yes Larey Dresser, MD  mirtazapine (REMERON) 30 MG tablet TAKE 1 TABLET BY MOUTH EVERY NIGHT AT BEDTIME Patient taking differently: Take 30 mg by mouth at bedtime. 11/01/20  Yes Lovorn, Jinny Blossom, MD  oxyCODONE (OXY IR/ROXICODONE) 5 MG immediate release tablet Take 1-2 tablets (5-10 mg total) by mouth every 6 (six) hours as needed for severe pain (to take 2 tabs ONLY when severe pain- which is not usual for pt). 11/21/20  Yes Lovorn, Jinny Blossom, MD   pantoprazole (PROTONIX) 40 MG tablet Take 1 tablet (40 mg total) by mouth daily. 04/20/20 04/15/21 Yes Sherren Mocha, MD  potassium chloride (KLOR-CON) 10 MEQ tablet Take 2 tablets (20 mEq total)  by mouth daily. 11/25/20 02/23/21 Yes Larey Dresser, MD  predniSONE (DELTASONE) 1 MG tablet Take 4 tablets (4 mg total) by mouth daily with breakfast. 09/19/20  Yes Deveshwar, Abel Presto, MD  tamsulosin (FLOMAX) 0.4 MG CAPS capsule Take 0.4 mg by mouth at bedtime. 12/23/18  Yes [provider]  traMADol (ULTRAM) 50 MG tablet TAKE 2 TABLETS(100 MG) BY MOUTH TWICE DAILY Patient taking differently: Take 100 mg by mouth 2 (two) times daily as needed for moderate pain. TAKE 2 TABLETS(100 MG) BY MOUTH TWICE DAILY 11/21/20  Yes Lovorn, Jinny Blossom, MD    Allergies    Celebrex [celecoxib] and Tape  Review of Systems   Review of Systems  Constitutional:  Negative for chills and fever.  HENT:  Negative for ear pain and sore throat.   Eyes:  Negative for pain and visual disturbance.  Respiratory:  Negative for cough and shortness of breath.   Cardiovascular:  Negative for chest pain and palpitations.  Gastrointestinal:  Negative for abdominal pain and vomiting.  Genitourinary:  Negative for dysuria and hematuria.  Musculoskeletal:  Positive for arthralgias, back pain and myalgias.  Skin:  Negative for color change and rash.  Neurological:  Negative for seizures and syncope.  All other systems reviewed and are negative.  Physical Exam Updated Vital Signs BP 115/77   Pulse 79   Temp 99.8 F (37.7 C) (Oral)   Resp 16   SpO2 92%   Physical Exam Vitals and nursing note reviewed.  Constitutional:      Appearance: He is well-developed.  HENT:     Head: Normocephalic and atraumatic.  Eyes:     Conjunctiva/sclera: Conjunctivae normal.  Cardiovascular:     Rate and Rhythm: Normal rate and regular rhythm.     Heart sounds: No murmur heard. Pulmonary:     Effort: Pulmonary effort is normal. No  respiratory distress.     Breath sounds: Normal breath sounds.  Abdominal:     Palpations: Abdomen is soft.     Tenderness: There is no abdominal tenderness.  Musculoskeletal:        General: Tenderness (L shoulder L hip) present.     Cervical back: Neck supple.  Skin:    General: Skin is warm and dry.  Neurological:     Mental Status: He is alert.    ED Results / Procedures / Treatments   Labs (all labs ordered are listed, but only abnormal results are displayed) Labs Reviewed  COMPREHENSIVE METABOLIC PANEL - Abnormal; Notable for the following components:      Result Value   Glucose, Bld 129 (*)    Calcium 8.4 (*)    Total Protein 6.0 (*)    Albumin 3.1 (*)    All other components within normal limits  CBC - Abnormal; Notable for the following components:   WBC 11.1 (*)    RBC 4.03 (*)    Hemoglobin 9.7 (*)    HCT 32.6 (*)    MCH 24.1 (*)    MCHC 29.8 (*)    All other components within normal limits  I-STAT CHEM 8, ED - Abnormal; Notable for the following components:   Chloride 97 (*)    Glucose, Bld 127 (*)    Calcium, Ion 1.06 (*)    Hemoglobin 10.5 (*)    HCT 31.0 (*)    All other components within normal limits  TROPONIN I (HIGH SENSITIVITY) - Abnormal; Notable for the following components:   Troponin I (High Sensitivity) 33 (*)  All other components within normal limits  TROPONIN I (HIGH SENSITIVITY) - Abnormal; Notable for the following components:   Troponin I (High Sensitivity) 111 (*)    All other components within normal limits  RESP PANEL BY RT-PCR (FLU A&B, COVID) ARPGX2  ETHANOL  URINALYSIS, ROUTINE W REFLEX MICROSCOPIC  LACTIC ACID, PLASMA  PROTIME-INR  SAMPLE TO BLOOD BANK  TROPONIN I (HIGH SENSITIVITY)    EKG None  Radiology DG Chest 1 View  Result Date: 12/03/2020 CLINICAL DATA:  Trauma, fall EXAM: CHEST  1 VIEW COMPARISON:  06/06/2020 FINDINGS: Transverse diameter of heart is slightly increased. There is metallic stent in the region  of aortic valve. Thoracic aorta is tortuous and ectatic. There are no signs of pulmonary edema or focal pulmonary consolidation. There is new faint linear density in right parahilar region. There is no significant pleural effusion or pneumothorax. IMPRESSION: There are no signs of alveolar pulmonary edema. New faint linear density in the right upper lobe in right parahilar region may suggest subsegmental atelectasis or early pneumonitis. There is no pleural effusion or pneumothorax. Reading location: [place], [place]. Electronically Signed   By: Elmer Picker M.D.   On: 12/03/2020 08:31   CT HEAD WO CONTRAST  Result Date: 12/03/2020 CLINICAL DATA:  [place] last evening. EXAM: CT HEAD WITHOUT CONTRAST TECHNIQUE: Contiguous axial images were obtained from the base of the skull through the vertex without intravenous contrast. COMPARISON:  None. FINDINGS: Brain: Mild age related cerebral atrophy, ventriculomegaly and periventricular white matter disease. No extra-axial fluid collections are identified. No CT findings for acute hemispheric infarction or intracranial hemorrhage. No mass lesions. The brainstem and cerebellum are normal. Vascular: Moderate vascular calcifications but no aneurysm hyperdense vessels. Skull: No skull fracture or bone lesions. Sinuses/Orbits: Scattered sinus disease. The mastoid air cells and middle ear cavities are clear. The globes are intact. Other: No scalp lesions or scalp hematoma. IMPRESSION: 1. Mild age related cerebral atrophy, ventriculomegaly and periventricular white matter disease. 2. No acute intracranial findings or skull fracture. Electronically Signed   By: Marijo Sanes M.D.   On: 12/03/2020 10:01   CT CHEST ABDOMEN PELVIS W CONTRAST  Result Date: 12/03/2020 CLINICAL DATA:  Fall, back pain.  Known aortic aneurysm EXAM: CT CHEST, ABDOMEN, AND PELVIS WITH CONTRAST TECHNIQUE: Multidetector CT imaging of the chest, abdomen and pelvis was performed following the  standard protocol during bolus administration of intravenous contrast. CONTRAST:  180mL OMNIPAQUE IOHEXOL 350 MG/ML SOLN COMPARISON:  CT 11/24/2020 FINDINGS: CT CHEST FINDINGS Cardiovascular: Aortic valve replacement noted. No acute findings the heart, aorta, or great vessels. Mediastinum/Nodes: No axillary or supraclavicular adenopathy. No mediastinal or hilar adenopathy. No pericardial fluid. Esophagus normal. Lungs/Pleura: Mild atelectasis in the posterior RIGHT upper lobe. Pleural fluid. No pneumothorax. No airspace disease. Musculoskeletal: No rib fracture. No scapular fracture No fracture or dislocation. CT ABDOMEN AND PELVIS FINDINGS Hepatobiliary: No focal hepatic lesion. Postcholecystectomy. No biliary dilatation. Pancreas: Pancreas is normal. No ductal dilatation. No pancreatic inflammation. Spleen: Normal spleen Adrenals/urinary tract: Adrenal glands and kidneys are normal. The ureters and bladder normal. Stomach/Bowel: Stomach, small bowel, appendix, and cecum are normal. Multiple diverticula of the descending colon and sigmoid colon without acute inflammation. Vascular/Lymphatic: Fusiform aneurysmal dilatation of the abdominal aorta to 4.1 cm. No change in short interval. No evidence of acute injury Reproductive: Prostatomegaly Other: No free fluid. Musculoskeletal: Fracture through the LEFT femoral neck with varus angulation. Interbody lumbar fusion L4-are after IMPRESSION: Chest Impression: 1. No acute findings in the thorax.  2. No evidence trauma. Abdomen / Pelvis Impression: 1. LEFT femoral neck fracture with varus angulation. 2. No acute findings in the peritoneal space. 3. No change in abdominal aortic aneurysm in short interval follow-up (8 days prior). See recommendation from CT 11/24/2020. Electronically Signed   By: Suzy Bouchard M.D.   On: 12/03/2020 10:21   DG Shoulder Left  Result Date: 12/03/2020 CLINICAL DATA:  Trauma, fall EXAM: LEFT SHOULDER - 2+ VIEW COMPARISON:  None.  FINDINGS: No recent fracture or dislocation is seen. There is decreased distance between acromion and humeral head which may be due to positioning or suggest chronic rotator cuff tear. Osteopenia is seen in bony structures. Surgical clips are seen in left side of neck. IMPRESSION: No recent fracture or dislocation is seen. Reading location: [place], [place]. Electronically Signed   By: Elmer Picker M.D.   On: 12/03/2020 08:45   DG Hip Unilat W or Wo Pelvis 2-3 Views Left  Result Date: 12/03/2020 CLINICAL DATA:  Trauma, fall EXAM: DG HIP (WITH OR WITHOUT PELVIS) 2-3V LEFT COMPARISON:  None. FINDINGS: Comminuted fracture is seen in the neck of left femur. There is overriding of fracture fragments and varus deformity at the fracture site. Postsurgical and degenerative changes are noted in lower lumbar spine. Arterial calcifications are seen in soft tissues. IMPRESSION: Displaced fracture is seen in neck of left femur. Reading location: [place], [place]. Electronically Signed   By: Elmer Picker M.D.   On: 12/03/2020 08:34    Procedures Procedures   Medications Ordered in ED Medications  enoxaparin (LOVENOX) injection 40 mg (40 mg Subcutaneous Given 12/03/20 1203)  oxyCODONE (Oxy IR/ROXICODONE) immediate release tablet 5-10 mg (10 mg Oral Given 12/03/20 1202)  senna-docusate (Senokot-S) tablet 1 tablet (has no administration in time range)  fentaNYL (SUBLIMAZE) injection 25 mcg (25 mcg Intravenous Given 12/03/20 1348)  allopurinol (ZYLOPRIM) tablet 200 mg (has no administration in time range)  aspirin EC tablet 81 mg (has no administration in time range)  atorvastatin (LIPITOR) tablet 80 mg (has no administration in time range)  amLODipine (NORVASC) tablet 10 mg (has no administration in time range)  carvedilol (COREG) tablet 12.5 mg (has no administration in time range)  ezetimibe (ZETIA) tablet 10 mg (has no administration in time range)  furosemide (LASIX) tablet 20 mg (has no  administration in time range)  citalopram (CELEXA) tablet 20 mg (has no administration in time range)  predniSONE (DELTASONE) tablet 4 mg (has no administration in time range)  mirtazapine (REMERON) tablet 30 mg (has no administration in time range)  pantoprazole (PROTONIX) EC tablet 40 mg (has no administration in time range)  tamsulosin (FLOMAX) capsule 0.4 mg (has no administration in time range)  gabapentin (NEURONTIN) capsule 300 mg (has no administration in time range)  potassium chloride (KLOR-CON) CR tablet 20 mEq (has no administration in time range)  clopidogrel (PLAVIX) tablet 75 mg (has no administration in time range)  fluticasone (FLONASE) 50 MCG/ACT nasal spray 2 spray (has no administration in time range)  fentaNYL (SUBLIMAZE) injection 50 mcg (50 mcg Intravenous Given 12/03/20 0746)  oxyCODONE-acetaminophen (PERCOCET/ROXICET) 5-325 MG per tablet 1 tablet (1 tablet Oral Given 12/03/20 0904)  iohexol (OMNIPAQUE) 350 MG/ML injection 100 mL (100 mLs Intravenous Contrast Given 12/03/20 [id number])    ED Course  I have reviewed the triage vital signs and the nursing notes.  Pertinent labs & imaging results that were available during my care of the patient were reviewed by me and considered in my medical  decision making (see chart for details).    MDM Rules/Calculators/A&P                           Patient seen in the emergency department for evaluation of a fall.  Physical exam reveals tenderness over the left shoulder and left hip.  Laboratory evaluation reveals leukocytosis to 11.1, hemoglobin 9.7 which is a mild decrease from 2 months ago, lactate unremarkable, urinalysis unremarkable.  Trauma imaging including a CT chest abdomen pelvis to evaluate for the patient's AAA is unremarkable outside of a left femoral neck fracture.  Orthopedics consulted who recommended medical admission and transfer to [facility] for surgical repair.  Patient then admitted. Final Clinical Impression(s)  / ED Diagnoses Final diagnoses:  Trauma    Rx / DC Orders ED Discharge Orders     None        Ryszard Socarras, MD 12/03/20 1447

## 2020-12-03 NOTE — ED Notes (Signed)
Dr. Tamala Julian aware of pts trop

## 2020-12-03 NOTE — Plan of Care (Signed)
Pt will remain free from falls and injury and maintain stable VS throughout shift.

## 2020-12-03 NOTE — Consult Note (Signed)
Cardiology Consultation:   Patient ID: Brandon [name] MRN: [id number]; DOB: 1940-03-15  Admit date: 12/03/2020 Date of Consult: 12/03/2020  PCP:  Haydee Salter, MD   [facility] Providers Cardiologist:  None        Patient Profile:   Brandon [name] is a 80 y.o. 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 Tamala Julian  History of Present Illness:   Mr. Baranek is a 80 y.o. 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 Tamala Julian. Patient had a mechanical fall last night resulting in coming to to the ED- and was found to have hip fracture. He had no LOC or dizziness or CP. Troponin was checked which showed to be from 33 to 111, 212, 347 hence cardiology was consulted for pre-operative risk stratification before being transferred to [facility] for potential hip operation. Currently he denies any chest pain. EKG has no new significant changes. 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  O2 saturations noted to be as low as 85% with improvement on 2 L nasal cannula oxygen. He seems to have some wheezing. He is known to have  CTO RCA w/ robust collaterals from LAD and 95% LAD stenosis s/p PCI/atherectomy on 04/27/20.  Also got a TAVR recently with stable gradients on recent echo. He is on Aspirin and Plavix.    Past Medical History:  Diagnosis Date   Abdominal aneurysm    Arthritis    "all over" (07/19/2016)   BPH (benign prostatic hypertrophy)    CAD (coronary artery disease)    Chicken pox    Chronic lower back pain    s/p surgical fusion   Depression    Diverticulosis    Esophageal stricture    GERD (gastroesophageal reflux disease)    Hepatitis B 1984   Hiatal hernia    History of radiation therapy 01/16/18- 03/05/18   Left Tonsil, 66 Gy in 33 fractions to high risk nodal echelons.    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 Burt Knack and Dr Cyndia Bent    Severe aortic stenosis    TIA (transient ischemic attack) 1990s   hx   tonsillar ca dx'd 11/2017   Tubular adenoma of colon 2009    Past Surgical History:  Procedure Laterality Date   BACK SURGERY  CARDIAC CATHETERIZATION     CATARACT EXTRACTION W/ INTRAOCULAR LENS  IMPLANT, BILATERAL Bilateral 01/2012 - 02/2012   CORONARY ATHERECTOMY N/A 04/27/2020   Procedure: CORONARY ATHERECTOMY;  Surgeon: Sherren Mocha, MD;  Location: [place] [facility];  Service: Cardiovascular;  Laterality: N/A;   CORONARY STENT INTERVENTION N/A 04/27/2020   Procedure: CORONARY STENT INTERVENTION;  Surgeon: Sherren Mocha, MD;  Location: [place] [facility];  Service: Cardiovascular;  Laterality: N/A;   DIRECT LARYNGOSCOPY Left 10/09/2017   Procedure: DIRECT LARYNGOSCOPY WITH BOPSY;  Surgeon: Jodi Marble, MD;  Location: [place];   Service: ENT;  Laterality: Left;   ESOPHAGOGASTRODUODENOSCOPY (EGD) WITH ESOPHAGEAL DILATION     "couple times" (07/19/2016)   ESOPHAGOSCOPY Left 10/09/2017   Procedure: ESOPHAGOSCOPY;  Surgeon: Jodi Marble, MD;  Location: [place];  Service: ENT;  Laterality: Left;   INTRAVASCULAR IMAGING/OCT N/A 04/27/2020   Procedure: INTRAVASCULAR IMAGING/OCT;  Surgeon: Sherren Mocha, MD;  Location: [place] [facility];  Service: Cardiovascular;  Laterality: N/A;   INTRAVASCULAR PRESSURE WIRE/FFR STUDY N/A 04/27/2020   Procedure: INTRAVASCULAR PRESSURE WIRE/FFR STUDY;  Surgeon: Sherren Mocha, MD;  Location: [place] [facility];  Service: Cardiovascular;  Laterality: N/A;   IR GASTROSTOMY TUBE MOD SED  01/08/2018   IR THORACENTESIS ASP PLEURAL SPACE W/IMG GUIDE  07/19/2016   LAPAROSCOPIC CHOLECYSTECTOMY  1994   LUMBAR [place] SURGERY  05/1996   L4-5; Dr. Claudean Kinds LAMINECTOMY/DECOMPRESSION MICRODISCECTOMY  10/2002   L3-4. Dr. Sherwood Gambler   MULTIPLE TOOTH EXTRACTIONS  1980s   PARTIAL GLOSSECTOMY  12/02/2017   Dr[facility] Nico[facility]- [facility]   pharyngoplasty for closure of tingue base defect  12/02/2017   Dr. Nicolette Bang- [place]  07/15/2016   POSTERIOR LUMBAR FUSION  09/1996   Ray cage, L4-5 Dr. Rita Ohara   PROSTATE BIOPSY  ~ 2017   radical tonsillectomy Left 12/02/2017   Dr. Nicolette Bang at [place] N/A 03/31/2020   Procedure: RIGHT HEART CATH AND CORONARY ANGIOGRAPHY;  Surgeon: Larey Dresser, MD;  Location: [place] [facility];  Service: Cardiovascular;  Laterality: N/A;   RIGID BRONCHOSCOPY Left 10/09/2017   Procedure: RIGID BRONCHOSCOPY;  Surgeon: Jodi Marble, MD;  Location: [place];  Service: ENT;  Laterality: Left;   TEE WITHOUT CARDIOVERSION N/A 06/07/2020   Procedure: TRANSESOPHAGEAL ECHOCARDIOGRAM (TEE);  Surgeon: Sherren Mocha, MD;  Location: [place];  Service: Open Heart Surgery;  Laterality: N/A;   TONSILLECTOMY      TRACHEOSTOMY  12/02/2017   Dr. Nicolette Bang- [facility]   ULTRASOUND GUIDANCE FOR VASCULAR ACCESS Right 06/07/2020   Procedure: ULTRASOUND GUIDANCE FOR VASCULAR ACCESS;  Surgeon: Sherren Mocha, MD;  Location: [place];  Service: Open Heart Surgery;  Laterality: Right;   VASCULAR SURGERY         Inpatient Medications: Scheduled Meds:  allopurinol  200 mg Oral Daily   amLODipine  10 mg Oral Daily   aspirin EC  81 mg Oral QHS   atorvastatin  80 mg Oral Daily   carvedilol  12.5 mg Oral BID   citalopram  20 mg Oral Daily   clopidogrel  75 mg Oral Daily   ezetimibe  10 mg Oral Daily   furosemide  20 mg Oral Daily   gabapentin  300 mg Oral TID   mirtazapine  30 mg Oral QHS   pantoprazole  40 mg Oral Daily   potassium chloride  20 mEq Oral Daily   [  START ON 12/04/2020] predniSONE  4 mg Oral Q breakfast   senna-docusate  1 tablet Oral QHS   tamsulosin  0.4 mg Oral QHS   Continuous Infusions:  PRN Meds: fentaNYL (SUBLIMAZE) injection, fluticasone, oxyCODONE  Allergies:    Allergies  Allergen Reactions   Celebrex [Celecoxib] Hives and Itching   Tape     Other reaction(s): Other (See Comments) Tears skin. Prefers paper tape    Social History:   Social History   Socioeconomic History   Marital status: Married    Spouse name: etta   Number of children: 2   Years of education: Not on file   Highest education level: Not on file  Occupational History   Occupation: retired    Fish farm manager: RETIRED    Comment: disabled due to back problems  Tobacco Use   Smoking status: Former    Packs/day: 1.50    Years: 50.00    Pack years: 75.00    Types: Cigarettes    Quit date: 09/05/2017    Years since quitting: 3.2   Smokeless tobacco: Never   Tobacco comments:    smoked less than 1 ppd for 40+ years;   Vaping Use   Vaping Use: Never used  Substance and Sexual Activity   Alcohol use: Yes    Alcohol/week: 0.0 standard drinks    Comment: Occasionally,not a heavy drinker    Drug use: Yes     Types: Oxycodone   Sexual activity: Never  Other Topics Concern   Not on file  Social History Narrative   ** Merged History Encounter **       Married (3rd), [place]. 2 children from 1st marriage, 4 step children.    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       Cell # [phone]   Social Determinants of Health   Financial Resource Strain: Low Risk    Difficulty of Paying Living Expenses: Not hard at all  Food Insecurity: No Food Insecurity   Worried About Running Out of Food in the Last Year: Never true   Ran Out of Food in the Last Year: Never true  Transportation Needs: Not on file  Physical Activity: Inactive   Days of Exercise per Week: 0 days   Minutes of Exercise per Session: 0 min  Stress: No Stress Concern Present   Feeling of Stress : Not at all  Social Connections: Moderately Isolated   Frequency of Communication with Friends and Family: More than three times a week   Frequency of Social Gatherings with Friends and Family: Once a week   Attends Religious Services: Never   Marine scientist or Organizations: No   Attends Music therapist: Never   Marital Status: Married  Human resources officer Violence: Not At Risk   Fear of Current or Ex-Partner: No   Emotionally Abused: No   Physically Abused: No   Sexually Abused: No    Family History:    Family History  Problem Relation Age of Onset   Heart disease Father 5       Living   Coronary artery disease Father        CABG   Alzheimer's disease Mother 74       Deceased   Arthritis Mother    Aneurysm Brother    Stomach cancer Maternal Uncle    Brain cancer Maternal Aunt  x2   Obesity Daughter        Had Bypass Sx     ROS:  Please see the history of present illness.  All other ROS reviewed and negative.     Physical Exam/Data:   Vitals:   12/03/20 1615 12/03/20 1900 12/03/20 1930 12/03/20 1932  BP:  (!) 153/92 119/69 111/73   Pulse: 93 73 76   Resp: 19 19 17    Temp:    98 F (36.7 C)  TempSrc:    Oral  SpO2: 97% 97% 98%   Weight:    79.4 kg  Height:    5\' 6"  (1.676 m)    Intake/Output Summary (Last 24 hours) at 12/03/2020 2014 Last data filed at 12/03/2020 1646 Gross per 24 hour  Intake --  Output 390 ml  Net -390 ml   Last 3 Weights 12/03/2020 11/30/2020 11/25/2020  Weight (lbs) 175 lb 182 lb 14.4 oz 186 lb 12.8 oz  Weight (kg) 79.379 kg 82.963 kg 84.732 kg     Body mass index is 28.25 kg/m.  General:  Well nourished, well developed, in moderate distress because of hip pain HEENT: normal Neck: no JVD Vascular: No carotid bruits; Distal pulses 2+ bilaterally Cardiac:  normal S1, S2; RRR; no murmur  Lungs:  Wheezing; poor airflow Abd: soft, distended.  Ext: no edema Musculoskeletal:  No deformities, BUE and BLE strength normal and equal Skin: warm and dry  Neuro:  CNs 2-12 intact, no focal abnormalities noted Psych:  Normal affect   EKG:  The EKG was personally reviewed and demonstrates:  No ST elevations   Laboratory Data:  High Sensitivity Troponin:   Recent Labs  Lab 12/03/20 0720 12/03/20 0922 12/03/20 1338 12/03/20 1538  TROPONINIHS 33* 111* 212* 347*     Chemistry Recent Labs  Lab 12/03/20 0720 12/03/20 0801  NA 135 135  K 3.6 3.6  CL 98 97*  CO2 29  --   GLUCOSE 129* 127*  BUN 17 17  CREATININE 0.90 0.80  CALCIUM 8.4*  --   GFRNONAA >60  --   ANIONGAP 8  --     Recent Labs  Lab 12/03/20 0720  PROT 6.0*  ALBUMIN 3.1*  AST 19  ALT 14  ALKPHOS 99  BILITOT 1.0   Lipids No results for input(s): CHOL, TRIG, HDL, LABVLDL, LDLCALC, CHOLHDL in the last 168 hours.  Hematology Recent Labs  Lab 12/03/20 0720 12/03/20 0801  WBC 11.1*  --   RBC 4.03*  --   HGB 9.7* 10.5*  HCT 32.6* 31.0*  MCV 80.9  --   MCH 24.1*  --   MCHC 29.8*  --   RDW 15.5  --   PLT 159  --    Thyroid No results for input(s): TSH, FREET4 in the last 168  hours.  BNPNo results for input(s): BNP, PROBNP in the last 168 hours.  DDimer No results for input(s): DDIMER in the last 168 hours.   Radiology/Studies:  DG Chest 1 View  Result Date: 12/03/2020 CLINICAL DATA:  Trauma, fall EXAM: CHEST  1 VIEW COMPARISON:  06/06/2020 FINDINGS: Transverse diameter of heart is slightly increased. There is metallic stent in the region of aortic valve. Thoracic aorta is tortuous and ectatic. There are no signs of pulmonary edema or focal pulmonary consolidation. There is new faint linear density in right parahilar region. There is no significant pleural effusion or pneumothorax. IMPRESSION: There are no signs of alveolar pulmonary edema. New faint linear density in  the right upper lobe in right parahilar region may suggest subsegmental atelectasis or early pneumonitis. There is no pleural effusion or pneumothorax. Reading location: [place], [place]. Electronically Signed   By: Elmer Picker M.D.   On: 12/03/2020 08:31   CT HEAD WO CONTRAST  Result Date: 12/03/2020 CLINICAL DATA:  [place] last evening. EXAM: CT HEAD WITHOUT CONTRAST TECHNIQUE: Contiguous axial images were obtained from the base of the skull through the vertex without intravenous contrast. COMPARISON:  None. FINDINGS: Brain: Mild age related cerebral atrophy, ventriculomegaly and periventricular white matter disease. No extra-axial fluid collections are identified. No CT findings for acute hemispheric infarction or intracranial hemorrhage. No mass lesions. The brainstem and cerebellum are normal. Vascular: Moderate vascular calcifications but no aneurysm hyperdense vessels. Skull: No skull fracture or bone lesions. Sinuses/Orbits: Scattered sinus disease. The mastoid air cells and middle ear cavities are clear. The globes are intact. Other: No scalp lesions or scalp hematoma. IMPRESSION: 1. Mild age related cerebral atrophy, ventriculomegaly and periventricular white matter disease. 2. No acute  intracranial findings or skull fracture. Electronically Signed   By: Marijo Sanes M.D.   On: 12/03/2020 10:01   CT CHEST ABDOMEN PELVIS W CONTRAST  Result Date: 12/03/2020 CLINICAL DATA:  Fall, back pain.  Known aortic aneurysm EXAM: CT CHEST, ABDOMEN, AND PELVIS WITH CONTRAST TECHNIQUE: Multidetector CT imaging of the chest, abdomen and pelvis was performed following the standard protocol during bolus administration of intravenous contrast. CONTRAST:  154mL OMNIPAQUE IOHEXOL 350 MG/ML SOLN COMPARISON:  CT 11/24/2020 FINDINGS: CT CHEST FINDINGS Cardiovascular: Aortic valve replacement noted. No acute findings the heart, aorta, or great vessels. Mediastinum/Nodes: No axillary or supraclavicular adenopathy. No mediastinal or hilar adenopathy. No pericardial fluid. Esophagus normal. Lungs/Pleura: Mild atelectasis in the posterior RIGHT upper lobe. Pleural fluid. No pneumothorax. No airspace disease. Musculoskeletal: No rib fracture. No scapular fracture No fracture or dislocation. CT ABDOMEN AND PELVIS FINDINGS Hepatobiliary: No focal hepatic lesion. Postcholecystectomy. No biliary dilatation. Pancreas: Pancreas is normal. No ductal dilatation. No pancreatic inflammation. Spleen: Normal spleen Adrenals/urinary tract: Adrenal glands and kidneys are normal. The ureters and bladder normal. Stomach/Bowel: Stomach, small bowel, appendix, and cecum are normal. Multiple diverticula of the descending colon and sigmoid colon without acute inflammation. Vascular/Lymphatic: Fusiform aneurysmal dilatation of the abdominal aorta to 4.1 cm. No change in short interval. No evidence of acute injury Reproductive: Prostatomegaly Other: No free fluid. Musculoskeletal: Fracture through the LEFT femoral neck with varus angulation. Interbody lumbar fusion L4-are after IMPRESSION: Chest Impression: 1. No acute findings in the thorax. 2. No evidence trauma. Abdomen / Pelvis Impression: 1. LEFT femoral neck fracture with varus  angulation. 2. No acute findings in the peritoneal space. 3. No change in abdominal aortic aneurysm in short interval follow-up (8 days prior). See recommendation from CT 11/24/2020. Electronically Signed   By: Suzy Bouchard M.D.   On: 12/03/2020 10:21   DG Shoulder Left  Result Date: 12/03/2020 CLINICAL DATA:  Trauma, fall EXAM: LEFT SHOULDER - 2+ VIEW COMPARISON:  None. FINDINGS: No recent fracture or dislocation is seen. There is decreased distance between acromion and humeral head which may be due to positioning or suggest chronic rotator cuff tear. Osteopenia is seen in bony structures. Surgical clips are seen in left side of neck. IMPRESSION: No recent fracture or dislocation is seen. Reading location: [place], [place]. Electronically Signed   By: Elmer Picker M.D.   On: 12/03/2020 08:45   DG Knee Left Port  Result Date: 12/03/2020 CLINICAL DATA:  Left hip fracture. EXAM: PORTABLE LEFT KNEE - 1-2 VIEW COMPARISON:  None. FINDINGS: The knee joint demonstrates moderate degenerative changes but no acute fracture. No joint effusion. Advanced vascular calcifications are noted. IMPRESSION: Moderate degenerative changes but no acute bony findings or joint effusion. Electronically Signed   By: Marijo Sanes M.D.   On: 12/03/2020 18:45   DG Hip Unilat W or Wo Pelvis 2-3 Views Left  Result Date: 12/03/2020 CLINICAL DATA:  Trauma, fall EXAM: DG HIP (WITH OR WITHOUT PELVIS) 2-3V LEFT COMPARISON:  None. FINDINGS: Comminuted fracture is seen in the neck of left femur. There is overriding of fracture fragments and varus deformity at the fracture site. Postsurgical and degenerative changes are noted in lower lumbar spine. Arterial calcifications are seen in soft tissues. IMPRESSION: Displaced fracture is seen in neck of left femur. Reading location: [place], [place]. Electronically Signed   By: Elmer Picker M.D.   On: 12/03/2020 08:34     Assessment and Plan:   # Elevated Troponin #  Type II MI # Severe CAD # Pre-operative Risk Stratification  -Troponin elevation is most likely in the setting of demand ischemia and severe CAD. He has wheezing/?component of COPD exacerbation + Severe pain due to hip fracture which may be exacerbating the troponin elevation in the setting of already known severe CAD -His recent Echo was re-assuring- currently does not have any chest pain. EKG shows no major changes. So this does not represent ACS most likely -His RCRI score is certainly >1  which puts him at high risk for adverse cardiovascular events. However his hip fracture portends a poor prognosis as well. Can consider stress testing to ensure no LAD ischemia/LAD stent thrombosis/ISR if hip surgery can wait. However presentation makes it unlikely to have LAD ischemia with no chest pain or EKG changes. He has known RCA CTO so unlikely to have modifiable risk factors. Considering the overall risks and benefits, maybe reasonable to go ahead with surgery acknowledging the risks for MACE. Patient understands and agrees with it -Continue GDMT and his excellent home medication regimen for his severe CAD.  -Can get another EKG to ensure no dynamic ST changes.  -His breathing status needs to be optimized as well- he is wheezing will need breathing treatment.   For questions or updates, please contact [place] Please consult [email or web address] for contact info under    Signed, Jaci Lazier, MD  12/03/2020 8:14 PM

## 2020-12-03 NOTE — ED Notes (Signed)
Patient transported to CT 

## 2020-12-03 NOTE — ED Notes (Signed)
[facility] arrived for transport 

## 2020-12-03 NOTE — Consult Note (Signed)
Orthopaedic Consult  Date Time: @TODAY @ 10:53 AM Patient Name: Brandon [name]  Attending Physician: @ATTPHYS @   Time first seen by orthopaedics: 47  ASSESSMENT & PLAN  Orthopaedic Assessment: 80 y.o. male with CAD, AS s/p TAVR, AAA with left displaced femoral neck fracture after mechanical fall.  Reductions/Procedures/Splinting/Anesthesia Performed: Reductions: N/A Splinting/casting: N/A Procedure(s): N/A Anesthesia: N/A  Plan: Discussed patient with Dr. Rod Can. Requested transfer to Hospitalist service at [facility], medical clearance and optimziation, and plan for OR tomorrow (12/04/2020) vs Monday (12/05/2020). Plan discussed with patient and all questions answered.   Medical Decision Making  Amount/complexity of data: Is there a pathologic fracture (e.g. neoplastic, osteoporotic insufficiency fracture)? Yes Independent interpretation of radiographic studies: Yes Review of radiology results (e.g. reports): Yes Tests ordered (e.g. additional radiographic studies, labs): No Lab results reviewed: Yes Reviewed old records: Yes History from another source (independent historian, e.g. family/friend/etc.): No Risk: Patient receiving IV controlled substances for pain: Yes Fracture requiring manipulation: No Urgent or emergent (non-elective) surgery likely this admission: Yes Presence of medical comorbidities and/or surgical risk factors (e.g. current smoker, CAD, diabetes, COPD, CKD, etc.): Yes Closed fracture management WITHOUT manipulation: No Urgent minor procedure (e.g. joint aspiration, compartment pressure measurement, etc.): No Will likely need surgery as an outpatient: No   Georgeanna Harrison M.D. Orthopaedic Surgery [facility]    HPI Brandon [name] is a 80 y.o. male. Orthopaedic consultation has specifically been requested to address this patient's current musculoskeletal presentation. He sustained a fall and had immediate pain in the  left hip/thigh.  Pain is sharp and severe.  Pain is worse movement and better with rest and immobilization.  Since the fall the patient was not able to ambulate or bear weight due to pain.  At baseline the patient is ambulatory with no assistive devices.  Also complaining of left shoulder pain.   PMH Past Medical History:  Diagnosis Date   Abdominal aneurysm    Arthritis    "all over" (07/19/2016)   BPH (benign prostatic hypertrophy)    CAD (coronary artery disease)    Chicken pox    Chronic lower back pain    s/p surgical fusion   Depression    Diverticulosis    Esophageal stricture    GERD (gastroesophageal reflux disease)    Hepatitis B 1984   Hiatal hernia    History of radiation therapy 01/16/18- 03/05/18   Left Tonsil, 66 Gy in 33 fractions to high risk nodal echelons.    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 Burt Knack and Dr Cyndia Bent    Severe aortic stenosis    TIA (transient ischemic attack) 1990s   hx   tonsillar ca dx'd 11/2017   Tubular adenoma of colon 2009     [facility] Past Surgical History:  Procedure Laterality Date   BACK SURGERY     CARDIAC CATHETERIZATION     CATARACT EXTRACTION W/ INTRAOCULAR LENS  IMPLANT, BILATERAL Bilateral 01/2012 - 02/2012   CORONARY ATHERECTOMY N/A 04/27/2020   Procedure: CORONARY ATHERECTOMY;  Surgeon: Sherren Mocha, MD;  Location: [place] [facility];  Service: Cardiovascular;  Laterality: N/A;   CORONARY STENT INTERVENTION N/A 04/27/2020   Procedure: CORONARY STENT INTERVENTION;  Surgeon: Sherren Mocha, MD;  Location: [place] [facility];  Service:  Cardiovascular;  Laterality: N/A;   DIRECT LARYNGOSCOPY Left 10/09/2017   Procedure: DIRECT LARYNGOSCOPY WITH BOPSY;  Surgeon: Jodi Marble, MD;  Location: [place];  Service: ENT;   Laterality: Left;   ESOPHAGOGASTRODUODENOSCOPY (EGD) WITH ESOPHAGEAL DILATION     "couple times" (07/19/2016)   ESOPHAGOSCOPY Left 10/09/2017   Procedure: ESOPHAGOSCOPY;  Surgeon: Jodi Marble, MD;  Location: [place];  Service: ENT;  Laterality: Left;   INTRAVASCULAR IMAGING/OCT N/A 04/27/2020   Procedure: INTRAVASCULAR IMAGING/OCT;  Surgeon: Sherren Mocha, MD;  Location: [place] [facility];  Service: Cardiovascular;  Laterality: N/A;   INTRAVASCULAR PRESSURE WIRE/FFR STUDY N/A 04/27/2020   Procedure: INTRAVASCULAR PRESSURE WIRE/FFR STUDY;  Surgeon: Sherren Mocha, MD;  Location: [place] [facility];  Service: Cardiovascular;  Laterality: N/A;   IR GASTROSTOMY TUBE MOD SED  01/08/2018   IR THORACENTESIS ASP PLEURAL SPACE W/IMG GUIDE  07/19/2016   LAPAROSCOPIC CHOLECYSTECTOMY  1994   LUMBAR Volo SURGERY  05/1996   L4-5; Dr. Claudean Kinds LAMINECTOMY/DECOMPRESSION MICRODISCECTOMY  10/2002   L3-4. Dr. Sherwood Gambler   MULTIPLE TOOTH EXTRACTIONS  1980s   PARTIAL GLOSSECTOMY  12/02/2017   Dr. Nicolette Bang- [facility]   pharyngoplasty for closure of tingue base defect  12/02/2017   Dr. Nicolette Bang- [place]  07/15/2016   POSTERIOR LUMBAR FUSION  09/1996   Ray cage, L4-5 Dr. Rita Ohara   PROSTATE BIOPSY  ~ 2017   radical tonsillectomy Left 12/02/2017   Dr. Nicolette Bang at [place] N/A 03/31/2020   Procedure: RIGHT HEART CATH AND CORONARY ANGIOGRAPHY;  Surgeon: Larey Dresser, MD;  Location: [place] [facility];  Service: Cardiovascular;  Laterality: N/A;   RIGID BRONCHOSCOPY Left 10/09/2017   Procedure: RIGID BRONCHOSCOPY;  Surgeon: Jodi Marble, MD;  Location: [place];  Service: ENT;  Laterality: Left;   TEE WITHOUT CARDIOVERSION N/A 06/07/2020   Procedure: TRANSESOPHAGEAL ECHOCARDIOGRAM (TEE);  Surgeon: Sherren Mocha, MD;  Location: [place];  Service: Open Heart Surgery;  Laterality: N/A;   TONSILLECTOMY      TRACHEOSTOMY  12/02/2017   Dr. Nicolette Bang- [facility]   ULTRASOUND GUIDANCE FOR VASCULAR ACCESS Right 06/07/2020   Procedure: ULTRASOUND GUIDANCE FOR VASCULAR ACCESS;  Surgeon: Sherren Mocha, MD;  Location: [place];  Service: Open Heart Surgery;  Laterality: Right;   VASCULAR SURGERY     Home Medications Prior to Admission medications   Medication Sig Start Date End Date Taking? Authorizing Provider  allopurinol (ZYLOPRIM) 100 MG tablet Take 2 tablets (200 mg total) by mouth daily. 09/12/20   Ofilia Neas, PA-C  amLODipine (NORVASC) 10 MG tablet TAKE 1 TABLET(10 MG) BY MOUTH DAILY 07/01/20   Larey Dresser, MD  aspirin 81 MG EC tablet Take 81 mg by mouth at bedtime.    [provider]  atorvastatin (LIPITOR) 80 MG tablet Take 1 tablet (80 mg total) by mouth daily. 04/01/20   Larey Dresser, MD  carvedilol (COREG) 12.5 MG tablet Take 1 tablet (12.5 mg total) by mouth 2 (two) times daily. 08/23/20 08/23/21  Larey Dresser, MD  citalopram (CELEXA) 20 MG tablet Take 1 tablet (20 mg total) by mouth daily. 04/25/20   Lovorn, Jinny Blossom, MD  clopidogrel (PLAVIX) 75 MG tablet Take 75 mg by mouth daily.    [provider]  ezetimibe (ZETIA) 10 MG tablet Take 1 tablet (10 mg total) by mouth daily. 05/23/20 05/23/21  Larey Dresser, MD  fluticasone (FLONASE) 50 MCG/ACT nasal spray SHAKE LIQUID AND USE 2 SPRAYS IN EACH NOSTRIL DAILY AS NEEDED FOR RHINITIS OR ALLERGIES 11/14/20   Haydee Salter, MD  furosemide (LASIX) 20 MG tablet Take 1 tablet (20 mg total) by mouth daily. 11/25/20 02/23/21  Larey Dresser, MD  gabapentin (NEURONTIN) 300 MG capsule TAKE 1 CAPSULE(300 MG) BY MOUTH THREE TIMES DAILY 10/07/20   Lovorn, Jinny Blossom, MD  isosorbide mononitrate (IMDUR) 30 MG 24 hr tablet TAKE 1 TABLET(30 MG) BY MOUTH AT BEDTIME 01/18/20   Brunetta Jeans, PA-C  lisinopril (ZESTRIL) 20 MG tablet TAKE 1 TABLET(10 MG) BY MOUTH DAILY 11/25/20   Larey Dresser, MD  mirtazapine (REMERON) 30 MG tablet TAKE 1 TABLET BY  MOUTH EVERY NIGHT AT BEDTIME 11/01/20   Lovorn, Jinny Blossom, MD  oxyCODONE (OXY IR/ROXICODONE) 5 MG immediate release tablet Take 1-2 tablets (5-10 mg total) by mouth every 6 (six) hours as needed for severe pain (to take 2 tabs ONLY when severe pain- which is not usual for pt). 11/21/20   Lovorn, Jinny Blossom, MD  pantoprazole (PROTONIX) 40 MG tablet Take 1 tablet (40 mg total) by mouth daily. 04/20/20 04/15/21  Sherren Mocha, MD  potassium chloride (KLOR-CON) 10 MEQ tablet Take 2 tablets (20 mEq total) by mouth daily. 11/25/20 02/23/21  Larey Dresser, MD  predniSONE (DELTASONE) 1 MG tablet Take 4 tablets (4 mg total) by mouth daily with breakfast. 09/19/20   Bo Merino, MD  tamsulosin (FLOMAX) 0.4 MG CAPS capsule Take 0.4 mg by mouth at bedtime. 12/23/18   [provider]  traMADol (ULTRAM) 50 MG tablet TAKE 2 TABLETS(100 MG) BY MOUTH TWICE DAILY 11/21/20   Lovorn, Jinny Blossom, MD     Allergies Allergies  Allergen Reactions   Celebrex [Celecoxib] Hives and Itching   Tape     Other reaction(s): Other (See Comments) Tears skin. Prefers paper tape     Family History Family History  Problem Relation Age of Onset   Heart disease Father 66       Living   Coronary artery disease Father        CABG   Alzheimer's disease Mother 8       Deceased   Arthritis Mother    Aneurysm Brother    Stomach cancer Maternal Uncle    Brain cancer Maternal Aunt        x2   Obesity Daughter        Had Bypass Sx    Social History Social History   Socioeconomic History   Marital status: Married    Spouse name: etta   Number of children: 2   Years of education: Not on file   Highest education level: Not on file  Occupational History   Occupation: retired    Fish farm manager: RETIRED    Comment: disabled due to back problems  Tobacco Use   Smoking status: Former    Packs/day: 1.50    Years: 50.00    Pack years: 75.00    Types: Cigarettes    Quit date: 09/05/2017    Years since quitting: 3.2   Smokeless  tobacco: Never   Tobacco comments:    smoked less than 1 ppd for 40+ years;   Vaping Use   Vaping Use: Never used  Substance and Sexual Activity   Alcohol use: Yes    Alcohol/week: 0.0 standard drinks    Comment: Occasionally,not a heavy drinker    Drug use: Yes    Types: Oxycodone   Sexual  activity: Never  Other Topics Concern   Not on file  Social History Narrative   ** Merged History Encounter **       Married (3rd), [place]. 2 children from 1st marriage, 4 step children.    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       Cell # [phone]   Social Determinants of Health   Financial Resource Strain: Low Risk    Difficulty of Paying Living Expenses: Not hard at all  Food Insecurity: No Food Insecurity   Worried About Running Out of Food in the Last Year: Never true   Ran Out of Food in the Last Year: Never true  Transportation Needs: Not on file  Physical Activity: Inactive   Days of Exercise per Week: 0 days   Minutes of Exercise per Session: 0 min  Stress: No Stress Concern Present   Feeling of Stress : Not at all  Social Connections: Moderately Isolated   Frequency of Communication with Friends and Family: More than three times a week   Frequency of Social Gatherings with Friends and Family: Once a week   Attends Religious Services: Never   Marine scientist or Organizations: No   Attends Music therapist: Never   Marital Status: Married  Human resources officer Violence: Not At Risk   Fear of Current or Ex-Partner: No   Emotionally Abused: No   Physically Abused: No   Sexually Abused: No     Review of Systems MSK: As noted per HPI above GI: No current Nausea/vomiting ENT: Denies sore throat, epistaxis CV: Denies chest pain  Resp: No current shortness of breath  Other than mentioned above, there are no Constitutional, Neurological, Psychiatric, ENT, Ophthalmological,  Cardiovascular, Respiratory, GI, GU, Musculoskeletal, Integumentary, Lymphatic, Endocrine or Allergic issues.    Imaging  Interpretation of orthopaedic-relevant films: Multiple views L hip and pelvis: displaced left femoral neck fx.  Radiographic results: DG Chest 1 View  Result Date: 12/03/2020 CLINICAL DATA:  Trauma, fall EXAM: CHEST  1 VIEW COMPARISON:  06/06/2020 FINDINGS: Transverse diameter of heart is slightly increased. There is metallic stent in the region of aortic valve. Thoracic aorta is tortuous and ectatic. There are no signs of pulmonary edema or focal pulmonary consolidation. There is new faint linear density in right parahilar region. There is no significant pleural effusion or pneumothorax. IMPRESSION: There are no signs of alveolar pulmonary edema. New faint linear density in the right upper lobe in right parahilar region may suggest subsegmental atelectasis or early pneumonitis. There is no pleural effusion or pneumothorax. Reading location: [place], [place]. Electronically Signed   By: Elmer Picker M.D.   On: 12/03/2020 08:31   CT HEAD WO CONTRAST  Result Date: 12/03/2020 CLINICAL DATA:  [place] last evening. EXAM: CT HEAD WITHOUT CONTRAST TECHNIQUE: Contiguous axial images were obtained from the base of the skull through the vertex without intravenous contrast. COMPARISON:  None. FINDINGS: Brain: Mild age related cerebral atrophy, ventriculomegaly and periventricular white matter disease. No extra-axial fluid collections are identified. No CT findings for acute hemispheric infarction or intracranial hemorrhage. No mass lesions. The brainstem and cerebellum are normal. Vascular: Moderate vascular calcifications but no aneurysm hyperdense vessels. Skull: No skull fracture or bone lesions. Sinuses/Orbits: Scattered sinus disease. The mastoid air cells and middle ear cavities are clear. The globes are intact. Other: No scalp lesions or scalp hematoma. IMPRESSION: 1. Mild age  related cerebral atrophy, ventriculomegaly and periventricular white matter disease. 2. No acute intracranial findings or skull fracture. Electronically Signed   By: Marijo Sanes M.D.   On: 12/03/2020 10:01   CT CHEST ABDOMEN PELVIS W CONTRAST  Result Date: 12/03/2020 CLINICAL DATA:  Fall, back pain.  Known aortic aneurysm EXAM: CT CHEST, ABDOMEN, AND PELVIS WITH CONTRAST TECHNIQUE: Multidetector CT imaging of the chest, abdomen and pelvis was performed following the standard protocol during bolus administration of intravenous contrast. CONTRAST:  158mL OMNIPAQUE IOHEXOL 350 MG/ML SOLN COMPARISON:  CT 11/24/2020 FINDINGS: CT CHEST FINDINGS Cardiovascular: Aortic valve replacement noted. No acute findings the heart, aorta, or great vessels. Mediastinum/Nodes: No axillary or supraclavicular adenopathy. No mediastinal or hilar adenopathy. No pericardial fluid. Esophagus normal. Lungs/Pleura: Mild atelectasis in the posterior RIGHT upper lobe. Pleural fluid. No pneumothorax. No airspace disease. Musculoskeletal: No rib fracture. No scapular fracture No fracture or dislocation. CT ABDOMEN AND PELVIS FINDINGS Hepatobiliary: No focal hepatic lesion. Postcholecystectomy. No biliary dilatation. Pancreas: Pancreas is normal. No ductal dilatation. No pancreatic inflammation. Spleen: Normal spleen Adrenals/urinary tract: Adrenal glands and kidneys are normal. The ureters and bladder normal. Stomach/Bowel: Stomach, small bowel, appendix, and cecum are normal. Multiple diverticula of the descending colon and sigmoid colon without acute inflammation. Vascular/Lymphatic: Fusiform aneurysmal dilatation of the abdominal aorta to 4.1 cm. No change in short interval. No evidence of acute injury Reproductive: Prostatomegaly Other: No free fluid. Musculoskeletal: Fracture through the LEFT femoral neck with varus angulation. Interbody lumbar fusion L4-are after IMPRESSION: Chest Impression: 1. No acute findings in the thorax. 2. No  evidence trauma. Abdomen / Pelvis Impression: 1. LEFT femoral neck fracture with varus angulation. 2. No acute findings in the peritoneal space. 3. No change in abdominal aortic aneurysm in short interval follow-up (8 days prior). See recommendation from CT 11/24/2020. Electronically Signed   By: Suzy Bouchard M.D.   On: 12/03/2020 10:21   DG Shoulder Left  Result Date: 12/03/2020 CLINICAL DATA:  Trauma, fall EXAM: LEFT SHOULDER - 2+ VIEW COMPARISON:  None. FINDINGS: No recent fracture or dislocation is seen. There is decreased distance between acromion and humeral head which may be due to positioning or suggest chronic rotator cuff tear. Osteopenia is seen in bony structures. Surgical clips are seen in left side of neck. IMPRESSION: No recent fracture or dislocation is seen. Reading location: [place], [place]. Electronically Signed   By: Elmer Picker M.D.   On: 12/03/2020 08:45   ECHOCARDIOGRAM COMPLETE  Result Date: 11/07/2020    ECHOCARDIOGRAM REPORT   Patient Name:   Brandon [name]  Date of Exam: 11/07/2020 Medical Rec #:  [id number]     Height:       66.0 in Accession #:    [id number]    Weight:       178.2 lb Date of Birth:  Nov 10, 1940     BSA:          1.904 m Patient Age:    25 years      BP:           146/69 mmHg Patient Gender: M             HR:           63 bpm. Exam Location:  [place] Procedure: 2D Echo, Cardiac Doppler and Color Doppler Indications:    Z95.2 S/p TAVR  History:        Patient has prior history of Echocardiogram examinations, most  recent 08/10/2020. S/p TAVR (79mm Sapien); Risk                 Factors:Hypertension, Dyslipidemia and Former Smoker.  Sonographer:    Coralyn Helling RDCS Referring Phys: [id number] [place]  1. Left ventricular ejection fraction, by estimation, is 60 to 65%. The left ventricle has normal function. The left ventricle has no regional wall motion abnormalities. There is moderate asymmetric left ventricular  hypertrophy of the basal and septal segments. Left ventricular diastolic parameters are consistent with Grade I diastolic dysfunction (impaired relaxation).  2. Right ventricular systolic function is normal. The right ventricular size is normal.  3. Left atrial size was moderately dilated.  4. The mitral valve is abnormal. Trivial mitral valve regurgitation. No evidence of mitral stenosis. Moderate mitral annular calcification.  5. Post TAVR with 29 mm Sapien 3 valve gradients stable since TTE done 08/10/20 Trivial PVL slightly less apparent than previously . The aortic valve has been repaired/replaced. Aortic valve regurgitation is trivial. No aortic stenosis is present.  6. The inferior vena cava is normal in size with greater than 50% respiratory variability, suggesting right atrial pressure of 3 mmHg. FINDINGS  Left Ventricle: Left ventricular ejection fraction, by estimation, is 60 to 65%. The left ventricle has normal function. The left ventricle has no regional wall motion abnormalities. The left ventricular internal cavity size was normal in size. There is  moderate asymmetric left ventricular hypertrophy of the basal and septal segments. Left ventricular diastolic parameters are consistent with Grade I diastolic dysfunction (impaired relaxation). Right Ventricle: The right ventricular size is normal. No increase in right ventricular wall thickness. Right ventricular systolic function is normal. Left Atrium: Left atrial size was moderately dilated. Right Atrium: Right atrial size was normal in size. Pericardium: There is no evidence of pericardial effusion. Mitral Valve: The mitral valve is abnormal. There is moderate thickening of the mitral valve leaflet(s). There is moderate calcification of the mitral valve leaflet(s). Moderate mitral annular calcification. Trivial mitral valve regurgitation. No evidence of mitral valve stenosis. Tricuspid Valve: The tricuspid valve is normal in structure. Tricuspid valve  regurgitation is not demonstrated. No evidence of tricuspid stenosis. Aortic Valve: Post TAVR with 29 mm Sapien 3 valve gradients stable since TTE done 08/10/20 Trivial PVL slightly less apparent than previously. The aortic valve has been repaired/replaced. Aortic valve regurgitation is trivial. No aortic stenosis is present. Aortic valve mean gradient measures 16.0 mmHg. Aortic valve peak gradient measures 32.0 mmHg. Aortic valve area, by VTI measures 2.40 cm. Pulmonic Valve: The pulmonic valve was normal in structure. Pulmonic valve regurgitation is not visualized. No evidence of pulmonic stenosis. Aorta: The aortic root is normal in size and structure. Venous: The inferior vena cava is normal in size with greater than 50% respiratory variability, suggesting right atrial pressure of 3 mmHg. IAS/Shunts: No atrial level shunt detected by color flow Doppler.  LEFT VENTRICLE PLAX 2D LVIDd:         4.20 cm  Diastology LVIDs:         2.70 cm  LV e' medial:    6.53 cm/s LV PW:         1.60 cm  LV E/e' medial:  16.4 LV IVS:        1.90 cm  LV e' lateral:   9.14 cm/s LVOT diam:     2.50 cm  LV E/e' lateral: 11.7 LV SV:         142 LV SV Index:  75 LVOT Area:     4.91 cm  RIGHT VENTRICLE             IVC RV S prime:     16.60 cm/s  IVC diam: 1.40 cm TAPSE (M-mode): 1.5 cm LEFT ATRIUM             Index       RIGHT ATRIUM           Index LA diam:        5.40 cm 2.84 cm/m  RA Pressure: 3.00 mmHg LA Vol (A2C):   65.9 ml 34.61 ml/m RA Area:     17.90 cm LA Vol (A4C):   85.0 ml 44.64 ml/m RA Volume:   38.90 ml  20.43 ml/m LA Biplane Vol: 76.4 ml 40.12 ml/m  AORTIC VALVE AV Area (Vmax):    2.12 cm AV Area (Vmean):   2.35 cm AV Area (VTI):     2.40 cm AV Vmax:           283.00 cm/s AV Vmean:          183.000 cm/s AV VTI:            0.592 m AV Peak Grad:      32.0 mmHg AV Mean Grad:      16.0 mmHg LVOT Vmax:         122.00 cm/s LVOT Vmean:        87.600 cm/s LVOT VTI:          0.289 m LVOT/AV VTI ratio: 0.49  AORTA Ao Root  diam: 3.50 cm Ao Asc diam:  3.80 cm MITRAL VALVE                TRICUSPID VALVE MV Area (PHT): 2.34 cm     Estimated RAP:  3.00 mmHg MV Decel Time: 324 msec MV E velocity: 107.00 cm/s  SHUNTS MV A velocity: 122.00 cm/s  Systemic VTI:  0.29 m MV E/A ratio:  0.88         Systemic Diam: 2.50 cm Jenkins Rouge MD Electronically signed by Jenkins Rouge MD Signature Date/Time: 11/07/2020/12:30:12 PM    Final    CT CHEST LUNG CA SCREEN LOW DOSE W/O CM  Result Date: 11/13/2020 CLINICAL DATA:  80 year old male former smoker (quit 1-2 years ago) with 60 pack-year history of smoking. Lung cancer screening examination. EXAM: CT CHEST WITHOUT CONTRAST LOW-DOSE FOR LUNG CANCER SCREENING TECHNIQUE: Multidetector CT imaging of the chest was performed following the standard protocol without IV contrast. COMPARISON:  Chest CTA 04/05/2020. FINDINGS: Cardiovascular: Heart size is normal. There is no significant pericardial fluid, thickening or pericardial calcification. There is aortic atherosclerosis, as well as atherosclerosis of the great vessels of the mediastinum and the coronary arteries, including calcified atherosclerotic plaque in the left main, left anterior descending, left circumflex and right coronary arteries. Mild calcifications of the mitral annulus. Status post TAVR. Mediastinum/Nodes: No pathologically enlarged mediastinal or hilar lymph nodes. Please note that accurate exclusion of hilar adenopathy is limited on noncontrast CT scans. Irregularity of the proximal trachea adjacent to site of tracheostomy. Esophagus is unremarkable in appearance. No axillary lymphadenopathy. Lungs/Pleura: No suspicious appearing pulmonary nodules or masses are noted. No acute consolidative airspace disease. No pleural effusions. Diffuse bronchial wall thickening with mild centrilobular and paraseptal emphysema. Upper Abdomen: Aortic atherosclerosis. Musculoskeletal: There are no aggressive appearing lytic or blastic lesions noted in  the visualized portions of the skeleton. IMPRESSION: 1. Lung-RADS 1S, negative. Continue annual screening with  low-dose chest CT without contrast in 12 months. 2. The "S" modifier above refers to potentially clinically significant non lung cancer related findings. Specifically, there is aortic atherosclerosis, in addition to 3 vessel coronary artery disease. Assessment for potential risk factor modification, dietary therapy or pharmacologic therapy may be warranted, if clinically indicated. 3. Mild diffuse bronchial wall thickening with mild centrilobular and paraseptal emphysema; imaging findings suggestive of underlying COPD. Aortic Atherosclerosis (ICD10-I70.0) and Emphysema (ICD10-J43.9). Electronically Signed   By: Vinnie Langton M.D.   On: 11/13/2020 06:12   DG Hip Unilat W or Wo Pelvis 2-3 Views Left  Result Date: 12/03/2020 CLINICAL DATA:  Trauma, fall EXAM: DG HIP (WITH OR WITHOUT PELVIS) 2-3V LEFT COMPARISON:  None. FINDINGS: Comminuted fracture is seen in the neck of left femur. There is overriding of fracture fragments and varus deformity at the fracture site. Postsurgical and degenerative changes are noted in lower lumbar spine. Arterial calcifications are seen in soft tissues. IMPRESSION: Displaced fracture is seen in neck of left femur. Reading location: [place], [place]. Electronically Signed   By: Elmer Picker M.D.   On: 12/03/2020 08:34   CT ANGIO ABDOMEN PELVIS  W &/OR WO CONTRAST  Result Date: 11/24/2020 CLINICAL DATA:  80 year old male with history of abdominal aortic aneurysm. Follow-up study. EXAM: CT ANGIOGRAPHY ABDOMEN AND PELVIS WITH CONTRAST AND WITHOUT CONTRAST TECHNIQUE: Multidetector CT imaging of the abdomen and pelvis was performed using the standard protocol during bolus administration of intravenous contrast. Multiplanar reconstructed images and MIPs were obtained and reviewed to evaluate the vascular anatomy. CONTRAST:  44mL OMNIPAQUE IOHEXOL 350 MG/ML SOLN  COMPARISON:  None. FINDINGS: VASCULAR Aorta: Similar appearing fusiform infrarenal abdominal aortic aneurysm measuring up to 52 x 44 mm in maximum dimension, increased from 49 x 42 mm by my measurements. Scattered atherosclerotic calcifications in the normal caliber and patent suprarenal abdominal aorta. Celiac: Patent without evidence of aneurysm, dissection, vasculitis or significant stenosis. SMA: Patent without evidence of aneurysm, dissection, vasculitis or significant stenosis. Replaced common hepatic artery arising from the proximal superior mesenteric artery. Renals: Dual right renal arteries are patent. Triple left renal arteries appear patent, the most diminutive supplying the atrophic lower pole. IMA: Patent without evidence of aneurysm, dissection, vasculitis or significant stenosis. Inflow: Moderate proximal stenosis of the bilateral common iliac artery secondary to atherosclerotic plaques. Patent distally bilaterally. The remaining bilateral external and internal iliac arteries are patent. Proximal Outflow: Bilateral common femoral and visualized portions of the superficial and profunda femoral arteries are patent without evidence of aneurysm, dissection, vasculitis or significant stenosis. Veins: No obvious venous abnormality within the limitations of this arterial phase study. Review of the MIP images confirms the above findings. NON-VASCULAR Lower chest: Moderate global cardiomegaly. Partially visualized TAVR. Hepatobiliary: No focal liver abnormality is seen. Status post cholecystectomy. No biliary dilatation. Pancreas: Unremarkable. No pancreatic ductal dilatation or surrounding inflammatory changes. Spleen: Normal in size without focal abnormality. Adrenals/Urinary Tract: Adrenal glands are unremarkable. Focal cortical thinning about the right kidney inferior pole. Kidneys are normal, without renal calculi, focal lesion, or hydronephrosis. Bladder is unremarkable. Stomach/Bowel: Stomach is within  normal limits. Appendix appears normal. Colonic diverticula, most prominent the sigmoid colon, without surrounding inflammatory changes. No evidence of bowel wall thickening, distention, or inflammatory changes. Lymphatic: No abdominopelvic lymphadenopathy. Reproductive: Prostate is enlarged measuring up to 5.1 cm in greatest axial dimension with median lobe protrusion into the base of bladder. Other: No abdominal wall hernia or abnormality. No abdominopelvic ascites. Musculoskeletal: Multilevel degenerative changes of  the visualized thoracolumbar spine. Disc spacer in place at L4-L5. No acute osseous abnormality. No aggressive appearing osseous lesion. IMPRESSION: VASCULAR 1. Slight interval increased size of previously visualized fusiform infrarenal abdominal aortic aneurysm which now measures up to 5.2 cm (previously 4.9 cm measurements). Recommend referral to a Vascular Specialist if not already established. This recommendation follows ACR consensus guidelines: White Paper of the ACR Incidental Findings Committee II on Vascular Findings. J Am Coll Radiol 2013; 10:789-794. 2. 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 NON-VASCULAR 1. Similar appearing sigmoid diverticulosis. 2. Prostatomegaly. Ruthann Cancer, MD [facility] Electronically Signed   By: Ruthann Cancer M.D.   On: 11/24/2020 16:18   Labs  Recent Labs    12/03/20 0720 12/03/20 0801  WBC 11.1*  --   HGB 9.7* 10.5*  HCT 32.6* 31.0*  PLT 159  --    Recent Labs    12/03/20 0720 12/03/20 0801  NA 135 135  K 3.6 3.6  CL 98 97*  CO2 29  --   BUN 17 17  CREATININE 0.90 0.80  GLUCOSE 129* 127*  CALCIUM 8.4*  --    Lab Results  Component Value Date   INR 1.0 12/03/2020   INR 1.0 07/01/2020   INR 1.0 06/06/2020            Physical Exam:  Patient is a 80 y.o. year old  male who is alert, well appearing, and in no distress, mood is calm  Orientation: oriented to person, place and time   BP 114/74   Pulse 86   Temp 99.8 F (37.7 C) (Oral)   Resp 18   SpO2 93%         Gait: unable to ambulate due to injury   Heart: normal rate Lungs: non-labored breathing Abdomen: soft, non-tender   RUE: Inspection: Atraumatic Palpation: Nontender ROM: Normal Joint Stability: Stable Strength: Normal EDC/DIO/FDP/APB Skin: Intact Peripheral Vascular: Normal Reflexes: No pathologic Sensation: SILT M/U/R Lymph Nodes: None Palpable Coordination: Normal  LUE: Inspection: Atraumatic Palpation: Mild TTP shoulder ROM: Normal distal to shoulder Joint Stability: No instability Strength: Normal EDC/DIO/FDP/APB Skin: Normal intact Peripheral Vascular: Normal, WWP Reflexes: No pathologic Sensation: SILT M/U/R Lymph Nodes: None Palpable Coordination: Intact  RLE: Inspection: Atraumatic Palpation: Nontender ROM: normal Joint Stability: No instability Strength: Normal throughout Skin: Intact Peripheral Vascular: Normal Reflexes: No pathologic Sensation: SILT SP/DP/T Lymph Nodes: None Palpable Coordination: Intact  LLE: Inspection: Atraumatic  Palpation: TTP L hip  ROM: Severely limited at hip due to injury Joint Stability: Stable knee and distally Strength: +DF/PF/EHL Skin: Intact Peripheral Vascular: Normal DP, WWP Reflexes: No pathologic Sensation: SILT SP/DP/T Lymph Nodes: None Palpable Coordination: Unable to assess due to injury   Pelvis: Skin: normal Palpation: Nontender Stability: No instability        The review of the patient's medications does not in any way constitute an endorsement, by this clinician,  of their use, dosage, indications, route, efficacy, interactions, or other clinical parameters.     This note was generated within the [employer] EMR using Dragon medical speech recognition software and may contain inherent  errors or omissions not intended by the user. Grammatical and punctuation errors, random word insertions, deletions, pronoun errors and incomplete sentences are occasional consequences of this technology due to software limitations. Not all errors are caught or corrected.  Although every attempt is made to root out erroneus and  incomplete transcription, the note may still not fully represent the intent or opinion of the author. If there are questions or concerns about the content of this note or information contained within the body of this dictation they should be addressed directly with the author for clarification.

## 2020-12-03 NOTE — ED Triage Notes (Addendum)
Pt arrives EMS after mechanical fall at home last night around midnight. Landed on sacral area. Rolled over and crawled to room where family assisted pt to bed. This morning unable to use lower extremities without "severe pain".   EMS inserted 22g Left hand Administered 12mcg fentanyl IVP

## 2020-12-03 NOTE — ED Notes (Signed)
Cardio paged x 2 at this time

## 2020-12-03 NOTE — H&P (Addendum)
History and Physical    Brandon [name] SPQ:[id number] DOB: 1940-06-22 DOA: 12/03/2020  Referring MD/NP/PA: Teressa Lower, MD PCP: Haydee Salter, MD  Patient coming from: Home via EMS  Chief Complaint: Left leg pain  I have personally briefly reviewed patient's old medical records in [place]   HPI: Brandon [name] is a 80 y.o. 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  He had cut off the lights last night around midnight when he tripped over the dog bed.  He denies any loss of consciousness or trauma to his head, but had severe pain out of his left hip.  He also hit his left shoulder which he states is difficult for him to raise his arm up due to pain.  He was unable to ambulate, bear weight, or move on the affected leg without being in extreme pain. Pain improve when he doesn't move the leg.  Patient denies having any chest pain, nausea, vomiting, abdominal pain, leg swelling, recent sick contacts.  He chronically has a cough with intermittent wheezing which he relates to his prior history of tonsillar squamous cell carcinoma treatment and difficulty clearing secretions.  Other associated symptoms included chronic pain for which he is on oxycodone followed by pain management. Notes that he is scheduled to have stenting of his abdominal aneurysm next month with vascular surgery.  En route with EMS patient was given fentanyl 100 mcg IV.  ED Course: Upon admission into the emergency department patient was seen to have a temperature of 99.8 F, respirations  16-27, blood pressures maintained, and O2 saturations noted to be as low as 85% with improvement on 2 L nasal cannula oxygen.  Labs significant for WBC 11.1, hemoglobin 9.7, and high-sensitivity troponin 33.  CT of the chest abdomen pelvis noted a left femoral neck fracture with varus angulation.  Orthopedics was formally consulted and recommended transferring patient to [employer] of surgery possibly tomorrow.  Review of Systems  Constitutional:  Positive for diaphoresis. Negative for fever.  HENT:  Positive for congestion.   Eyes:  Negative for photophobia and pain.  Respiratory:  Positive for cough, sputum production and shortness of breath.   Cardiovascular:  Negative for chest pain and leg swelling.  Gastrointestinal:  Negative for abdominal pain, nausea and vomiting.  Genitourinary:  Negative for dysuria and hematuria.  Musculoskeletal:  Positive for back pain, falls, joint pain and myalgias.  Skin:  Negative for rash.  Neurological:  Negative for focal weakness and loss of consciousness.  Psychiatric/Behavioral:  Negative for memory loss and substance abuse.    Past Medical History:  Diagnosis Date   Abdominal aneurysm    Arthritis    "all over" (07/19/2016)   BPH (benign prostatic hypertrophy)    CAD (coronary artery disease)    Chicken pox    Chronic lower back pain    s/p surgical fusion   Depression    Diverticulosis    Esophageal stricture    GERD (gastroesophageal reflux disease)    Hepatitis B 1984   Hiatal hernia    History of radiation  therapy 01/16/18- 03/05/18   Left Tonsil, 66 Gy in 33 fractions to high risk nodal echelons.    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 Burt Knack and Dr Cyndia Bent    Severe aortic stenosis    TIA (transient ischemic attack) 1990s   hx    tonsillar ca dx'd 11/2017   Tubular adenoma of colon 2009    Past Surgical History:  Procedure Laterality Date   BACK SURGERY     CARDIAC CATHETERIZATION     CATARACT EXTRACTION W/ INTRAOCULAR LENS  IMPLANT, BILATERAL Bilateral 01/2012 - 02/2012   CORONARY ATHERECTOMY N/A 04/27/2020   Procedure: CORONARY ATHERECTOMY;  Surgeon: Sherren Mocha, MD;  Location: [facility];  Service: Cardiovascular;  Laterality: N/A;   CORONARY STENT INTERVENTION N/A 04/27/2020   Procedure: CORONARY STENT INTERVENTION;  Surgeon: Sherren Mocha, MD;  Location: [facility];  Service: Cardiovascular;  Laterality: N/A;   DIRECT LARYNGOSCOPY Left 10/09/2017   Procedure: DIRECT LARYNGOSCOPY WITH BOPSY;  Surgeon: Jodi Marble, MD;  Location: [facility];  Service: ENT;  Laterality: Left;   ESOPHAGOGASTRODUODENOSCOPY (EGD) WITH ESOPHAGEAL DILATION     "couple times" (07/19/2016)   ESOPHAGOSCOPY Left 10/09/2017   Procedure: ESOPHAGOSCOPY;  Surgeon: Jodi Marble, MD;  Location: [facility];  Service: ENT;  Laterality: Left;   INTRAVASCULAR IMAGING/OCT N/A 04/27/2020   Procedure: INTRAVASCULAR IMAGING/OCT;  Surgeon: Sherren Mocha, MD;  Location: [facility];  Service: Cardiovascular;  Laterality: N/A;   INTRAVASCULAR PRESSURE WIRE/FFR STUDY N/A 04/27/2020   Procedure: INTRAVASCULAR PRESSURE WIRE/FFR STUDY;  Surgeon: Sherren Mocha, MD;  Location: [facility];  Service: Cardiovascular;  Laterality: N/A;   IR GASTROSTOMY TUBE MOD SED  01/08/2018   IR THORACENTESIS ASP PLEURAL SPACE W/IMG GUIDE  07/19/2016   LAPAROSCOPIC CHOLECYSTECTOMY  1994   LUMBAR [place] SURGERY  05/1996   L4-5; Dr. Claudean Kinds LAMINECTOMY/DECOMPRESSION MICRODISCECTOMY  10/2002   L3-4. Dr. Sherwood Gambler   MULTIPLE TOOTH EXTRACTIONS  1980s   PARTIAL GLOSSECTOMY  12/02/2017   Dr. Nicolette Bang- [facility]   pharyngoplasty for closure of tingue base defect  12/02/2017   Dr. Nicolette Bang- [place]  07/15/2016   POSTERIOR LUMBAR FUSION  09/1996   Ray cage, L4-5 Dr. Rita Ohara   PROSTATE BIOPSY  ~ 2017   radical tonsillectomy Left 12/02/2017   Dr. Nicolette Bang at [place] N/A 03/31/2020   Procedure: RIGHT HEART CATH AND CORONARY ANGIOGRAPHY;  Surgeon: Larey Dresser, MD;  Location: [facility];  Service: Cardiovascular;  Laterality: N/A;   RIGID BRONCHOSCOPY Left 10/09/2017   Procedure: RIGID BRONCHOSCOPY;  Surgeon: Jodi Marble, MD;  Location: [facility];  Service: ENT;  Laterality: Left;   TEE WITHOUT CARDIOVERSION N/A 06/07/2020   Procedure: TRANSESOPHAGEAL ECHOCARDIOGRAM (TEE);  Surgeon: Sherren Mocha, MD;  Location: [facility];  Service: Open Heart Surgery;  Laterality: N/A;   TONSILLECTOMY     TRACHEOSTOMY  12/02/2017   Dr. Nicolette Bang- [facility]   ULTRASOUND GUIDANCE FOR VASCULAR ACCESS Right 06/07/2020   Procedure: ULTRASOUND GUIDANCE FOR VASCULAR ACCESS;  Surgeon: Sherren Mocha, MD;  Location: [facility];  Service: Open Heart Surgery;  Laterality: Right;   VASCULAR SURGERY  reports that he quit smoking about 3 years ago. His smoking use included cigarettes. He has a 75.00 pack-year smoking history. He has never used smokeless tobacco. He reports current alcohol use. He reports current drug use. Drug: Oxycodone.  Allergies  Allergen Reactions   Celebrex [Celecoxib] Hives and Itching   Tape     Other reaction(s): Other (See Comments) Tears skin. Prefers paper tape    Family History  Problem Relation Age of Onset   Heart disease Father 74       Living   Coronary artery disease Father        CABG   Alzheimer's disease Mother 65       Deceased   Arthritis Mother    Aneurysm Brother    Stomach cancer Maternal Uncle    Brain cancer Maternal Aunt        x2   Obesity Daughter        Had Bypass Sx    Prior to Admission medications   Medication Sig Start Date End Date Taking? Authorizing Provider  allopurinol  (ZYLOPRIM) 100 MG tablet Take 2 tablets (200 mg total) by mouth daily. 09/12/20   Ofilia Neas, PA-C  amLODipine (NORVASC) 10 MG tablet TAKE 1 TABLET(10 MG) BY MOUTH DAILY 07/01/20   Larey Dresser, MD  aspirin 81 MG EC tablet Take 81 mg by mouth at bedtime.    [provider]  atorvastatin (LIPITOR) 80 MG tablet Take 1 tablet (80 mg total) by mouth daily. 04/01/20   Larey Dresser, MD  carvedilol (COREG) 12.5 MG tablet Take 1 tablet (12.5 mg total) by mouth 2 (two) times daily. 08/23/20 08/23/21  Larey Dresser, MD  citalopram (CELEXA) 20 MG tablet Take 1 tablet (20 mg total) by mouth daily. 04/25/20   Lovorn, Jinny Blossom, MD  clopidogrel (PLAVIX) 75 MG tablet Take 75 mg by mouth daily.    [provider]  ezetimibe (ZETIA) 10 MG tablet Take 1 tablet (10 mg total) by mouth daily. 05/23/20 05/23/21  Larey Dresser, MD  fluticasone (FLONASE) 50 MCG/ACT nasal spray SHAKE LIQUID AND USE 2 SPRAYS IN EACH NOSTRIL DAILY AS NEEDED FOR RHINITIS OR ALLERGIES 11/14/20   Haydee Salter, MD  furosemide (LASIX) 20 MG tablet Take 1 tablet (20 mg total) by mouth daily. 11/25/20 02/23/21  Larey Dresser, MD  gabapentin (NEURONTIN) 300 MG capsule TAKE 1 CAPSULE(300 MG) BY MOUTH THREE TIMES DAILY 10/07/20   Lovorn, Jinny Blossom, MD  isosorbide mononitrate (IMDUR) 30 MG 24 hr tablet TAKE 1 TABLET(30 MG) BY MOUTH AT BEDTIME 01/18/20   Brunetta Jeans, PA-C  lisinopril (ZESTRIL) 20 MG tablet TAKE 1 TABLET(10 MG) BY MOUTH DAILY 11/25/20   Larey Dresser, MD  mirtazapine (REMERON) 30 MG tablet TAKE 1 TABLET BY MOUTH EVERY NIGHT AT BEDTIME 11/01/20   Lovorn, Jinny Blossom, MD  oxyCODONE (OXY IR/ROXICODONE) 5 MG immediate release tablet Take 1-2 tablets (5-10 mg total) by mouth every 6 (six) hours as needed for severe pain (to take 2 tabs ONLY when severe pain- which is not usual for pt). 11/21/20   Lovorn, Jinny Blossom, MD  pantoprazole (PROTONIX) 40 MG tablet Take 1 tablet (40 mg total) by mouth daily. 04/20/20 04/15/21  Sherren Mocha, MD  potassium chloride (KLOR-CON) 10 MEQ tablet Take 2 tablets (20 mEq total) by mouth daily. 11/25/20 02/23/21  Larey Dresser, MD  predniSONE (DELTASONE) 1 MG tablet Take 4 tablets (4 mg total) by mouth daily with breakfast. 09/19/20  Bo Merino, MD  tamsulosin (FLOMAX) 0.4 MG CAPS capsule Take 0.4 mg by mouth at bedtime. 12/23/18   [provider]  traMADol (ULTRAM) 50 MG tablet TAKE 2 TABLETS(100 MG) BY MOUTH TWICE DAILY 11/21/20   Lovorn, Jinny Blossom, MD    Physical Exam:  Constitutional: Elderly male currently in no acute distress Vitals:   12/03/20 0900 12/03/20 1019 12/03/20 1030 12/03/20 1100  BP: (!) 129/97 106/80 114/74 (!) 124/104  Pulse: 95 88 86 84  Resp: 16 18 18 18   Temp:      TempSrc:      SpO2: 96% 90% 93% 94%   Eyes: PERRL, lids and conjunctivae normal ENMT: Mucous membranes are moist. Posterior pharynx clear of any exudate or lesions.  Neck: normal, supple, no masses, no thyromegaly.  Mild upper airway wheezing appreciated. Respiratory: clear to auscultation bilaterally, no wheezing, no crackles. Normal respiratory effort. No accessory muscle use.  Cardiovascular: Regular rate and rhythm, no murmurs / rubs / gallops. No extremity edema. 2+ pedal pulses. No carotid bruits.  Abdomen: no tenderness, no masses palpated. No hepatosplenomegaly. Bowel sounds positive.  Musculoskeletal: no clubbing / cyanosis.  Left leg is mildly shortened with tenderness to palpation of the left hip. Skin: Diaphoretic.  No rashes, lesions, ulcers. No induration Neurologic: CN 2-12 grossly intact. Sensation intact, DTR normal. Strength 5/5 in all 4.  Psychiatric: Normal judgment and insight. Alert and oriented x 3. Normal mood.     Labs on Admission: I have personally reviewed following labs and imaging studies  CBC: Recent Labs  Lab 12/03/20 0720 12/03/20 0801  WBC 11.1*  --   HGB 9.7* 10.5*  HCT 32.6* 31.0*  MCV 80.9  --   PLT 159  --    Basic Metabolic  Panel: Recent Labs  Lab 12/03/20 0720 12/03/20 0801  NA 135 135  K 3.6 3.6  CL 98 97*  CO2 29  --   GLUCOSE 129* 127*  BUN 17 17  CREATININE 0.90 0.80  CALCIUM 8.4*  --    GFR: Estimated Creatinine Clearance: 77 mL/min (by C-G formula based on SCr of 0.8 mg/dL). Liver Function Tests: Recent Labs  Lab 12/03/20 0720  AST 19  ALT 14  ALKPHOS 99  BILITOT 1.0  PROT 6.0*  ALBUMIN 3.1*   No results for input(s): LIPASE, AMYLASE in the last 168 hours. No results for input(s): AMMONIA in the last 168 hours. Coagulation Profile: Recent Labs  Lab 12/03/20 0720  INR 1.0   Cardiac Enzymes: No results for input(s): CKTOTAL, CKMB, CKMBINDEX, TROPONINI in the last 168 hours. BNP (last 3 results) No results for input(s): PROBNP in the last 8760 hours. HbA1C: No results for input(s): HGBA1C in the last 72 hours. CBG: No results for input(s): GLUCAP in the last 168 hours. Lipid Profile: No results for input(s): CHOL, HDL, LDLCALC, TRIG, CHOLHDL, LDLDIRECT in the last 72 hours. Thyroid Function Tests: No results for input(s): TSH, T4TOTAL, FREET4, T3FREE, THYROIDAB in the last 72 hours. Anemia Panel: No results for input(s): VITAMINB12, FOLATE, FERRITIN, TIBC, IRON, RETICCTPCT in the last 72 hours. Urine analysis:    Component Value Date/Time   COLORURINE YELLOW 12/03/2020 0720   APPEARANCEUR CLEAR 12/03/2020 0720   LABSPEC 1.014 12/03/2020 0720   PHURINE 6.0 12/03/2020 0720   GLUCOSEU NEGATIVE 12/03/2020 0720   GLUCOSEU NEGATIVE 09/29/2019 1125   HGBUR NEGATIVE 12/03/2020 0720   BILIRUBINUR NEGATIVE 12/03/2020 0720   BILIRUBINUR negative 08/01/2016 [place] 12/03/2020 0720  PROTEINUR NEGATIVE 12/03/2020 0720   UROBILINOGEN 0.2 09/29/2019 1125   NITRITE NEGATIVE 12/03/2020 0720   LEUKOCYTESUR NEGATIVE 12/03/2020 0720   Sepsis Labs: Recent Results (from the past 240 hour(s))  Resp Panel by RT-PCR (Flu A&B, Covid) Nasopharyngeal Swab     Status: None    Collection Time: 12/03/20  7:20 AM   Specimen: Nasopharyngeal Swab; Nasopharyngeal(NP) swabs in vial transport medium  Result Value Ref Range Status   SARS Coronavirus 2 by RT PCR NEGATIVE NEGATIVE Final    Comment: (NOTE) SARS-CoV-2 target nucleic acids are NOT DETECTED.  The SARS-CoV-2 RNA is generally detectable in upper respiratory specimens during the acute phase of infection. The lowest concentration of SARS-CoV-2 viral copies this assay can detect is 138 copies/mL. A negative result does not preclude SARS-Cov-2 infection and should not be used as the sole basis for treatment or other patient management decisions. A negative result may occur with  improper specimen collection/handling, submission of specimen other than nasopharyngeal swab, presence of viral mutation(s) within the areas targeted by this assay, and inadequate number of viral copies(<138 copies/mL). A negative result must be combined with clinical observations, patient history, and epidemiological information. The expected result is Negative.  Fact Sheet for Patients:  EntrepreneurPulse.com.au  Fact Sheet for Healthcare Providers:  IncredibleEmployment.be  This test is no t yet approved or cleared by the [place] FDA and  has been authorized for detection and/or diagnosis of SARS-CoV-2 by FDA under an Emergency Use Authorization (EUA). This EUA will remain  in effect (meaning this test can be used) for the duration of the COVID-19 declaration under Section 564(b)(1) of the Act, 21 U.S.C.section 360bbb-3(b)(1), unless the authorization is terminated  or revoked sooner.       Influenza A by PCR NEGATIVE NEGATIVE Final   Influenza B by PCR NEGATIVE NEGATIVE Final    Comment: (NOTE) The Xpert Xpress SARS-CoV-2/FLU/RSV plus assay is intended as an aid in the diagnosis of influenza from Nasopharyngeal swab specimens and should not be used as a sole basis for treatment.  Nasal washings and aspirates are unacceptable for Xpert Xpress SARS-CoV-2/FLU/RSV testing.  Fact Sheet for Patients: EntrepreneurPulse.com.au  Fact Sheet for Healthcare Providers: IncredibleEmployment.be  This test is not yet approved or cleared by the [place] FDA and has been authorized for detection and/or diagnosis of SARS-CoV-2 by FDA under an Emergency Use Authorization (EUA). This EUA will remain in effect (meaning this test can be used) for the duration of the COVID-19 declaration under Section 564(b)(1) of the Act, 21 U.S.C. section 360bbb-3(b)(1), unless the authorization is terminated or revoked.  Performed at [facility], [place] [address] [place], [place] [address]      Radiological Exams on Admission: DG Chest 1 View  Result Date: 12/03/2020 CLINICAL DATA:  Trauma, fall EXAM: CHEST  1 VIEW COMPARISON:  06/06/2020 FINDINGS: Transverse diameter of heart is slightly increased. There is metallic stent in the region of aortic valve. Thoracic aorta is tortuous and ectatic. There are no signs of pulmonary edema or focal pulmonary consolidation. There is new faint linear density in right parahilar region. There is no significant pleural effusion or pneumothorax. IMPRESSION: There are no signs of alveolar pulmonary edema. New faint linear density in the right upper lobe in right parahilar region may suggest subsegmental atelectasis or early pneumonitis. There is no pleural effusion or pneumothorax. Reading location: [place], [place]. Electronically Signed   By: Elmer Picker M.D.   On: 12/03/2020 08:31   CT  HEAD WO CONTRAST  Result Date: 12/03/2020 CLINICAL DATA:  [place] last evening. EXAM: CT HEAD WITHOUT CONTRAST TECHNIQUE: Contiguous axial images were obtained from the base of the skull through the vertex without intravenous contrast. COMPARISON:  None. FINDINGS: Brain: Mild age related cerebral atrophy, ventriculomegaly and  periventricular white matter disease. No extra-axial fluid collections are identified. No CT findings for acute hemispheric infarction or intracranial hemorrhage. No mass lesions. The brainstem and cerebellum are normal. Vascular: Moderate vascular calcifications but no aneurysm hyperdense vessels. Skull: No skull fracture or bone lesions. Sinuses/Orbits: Scattered sinus disease. The mastoid air cells and middle ear cavities are clear. The globes are intact. Other: No scalp lesions or scalp hematoma. IMPRESSION: 1. Mild age related cerebral atrophy, ventriculomegaly and periventricular white matter disease. 2. No acute intracranial findings or skull fracture. Electronically Signed   By: Marijo Sanes M.D.   On: 12/03/2020 10:01   CT CHEST ABDOMEN PELVIS W CONTRAST  Result Date: 12/03/2020 CLINICAL DATA:  Fall, back pain.  Known aortic aneurysm EXAM: CT CHEST, ABDOMEN, AND PELVIS WITH CONTRAST TECHNIQUE: Multidetector CT imaging of the chest, abdomen and pelvis was performed following the standard protocol during bolus administration of intravenous contrast. CONTRAST:  156mL OMNIPAQUE IOHEXOL 350 MG/ML SOLN COMPARISON:  CT 11/24/2020 FINDINGS: CT CHEST FINDINGS Cardiovascular: Aortic valve replacement noted. No acute findings the heart, aorta, or great vessels. Mediastinum/Nodes: No axillary or supraclavicular adenopathy. No mediastinal or hilar adenopathy. No pericardial fluid. Esophagus normal. Lungs/Pleura: Mild atelectasis in the posterior RIGHT upper lobe. Pleural fluid. No pneumothorax. No airspace disease. Musculoskeletal: No rib fracture. No scapular fracture No fracture or dislocation. CT ABDOMEN AND PELVIS FINDINGS Hepatobiliary: No focal hepatic lesion. Postcholecystectomy. No biliary dilatation. Pancreas: Pancreas is normal. No ductal dilatation. No pancreatic inflammation. Spleen: Normal spleen Adrenals/urinary tract: Adrenal glands and kidneys are normal. The ureters and bladder normal.  Stomach/Bowel: Stomach, small bowel, appendix, and cecum are normal. Multiple diverticula of the descending colon and sigmoid colon without acute inflammation. Vascular/Lymphatic: Fusiform aneurysmal dilatation of the abdominal aorta to 4.1 cm. No change in short interval. No evidence of acute injury Reproductive: Prostatomegaly Other: No free fluid. Musculoskeletal: Fracture through the LEFT femoral neck with varus angulation. Interbody lumbar fusion L4-are after IMPRESSION: Chest Impression: 1. No acute findings in the thorax. 2. No evidence trauma. Abdomen / Pelvis Impression: 1. LEFT femoral neck fracture with varus angulation. 2. No acute findings in the peritoneal space. 3. No change in abdominal aortic aneurysm in short interval follow-up (8 days prior). See recommendation from CT 11/24/2020. Electronically Signed   By: Suzy Bouchard M.D.   On: 12/03/2020 10:21   DG Shoulder Left  Result Date: 12/03/2020 CLINICAL DATA:  Trauma, fall EXAM: LEFT SHOULDER - 2+ VIEW COMPARISON:  None. FINDINGS: No recent fracture or dislocation is seen. There is decreased distance between acromion and humeral head which may be due to positioning or suggest chronic rotator cuff tear. Osteopenia is seen in bony structures. Surgical clips are seen in left side of neck. IMPRESSION: No recent fracture or dislocation is seen. Reading location: [place], [place]. Electronically Signed   By: Elmer Picker M.D.   On: 12/03/2020 08:45   DG Hip Unilat W or Wo Pelvis 2-3 Views Left  Result Date: 12/03/2020 CLINICAL DATA:  Trauma, fall EXAM: DG HIP (WITH OR WITHOUT PELVIS) 2-3V LEFT COMPARISON:  None. FINDINGS: Comminuted fracture is seen in the neck of left femur. There is overriding of fracture fragments and varus deformity at the fracture  site. Postsurgical and degenerative changes are noted in lower lumbar spine. Arterial calcifications are seen in soft tissues. IMPRESSION: Displaced fracture is seen in neck of left  femur. Reading location: [place], [place]. Electronically Signed   By: Elmer Picker M.D.   On: 12/03/2020 08:34    EKG: Independently reviewed.  Sinus rhythm at 86 bpm with  minimal ST depressions noted in the lateral leads  Assessment/Plan Displaced left femoral neck fracture secondary to fall: Patient reports that he had cut out the lights last night from when he tripped over a dog bed causing him to fall.  Complained of having severe left hip.  Imaging significant for displaced fracture of the left femur.  Orthopedics was formally consulted to take patient to surgery possibly tomorrow. -Admit to a telemetry bed at [facility] long -Hip fracture order set -N.p.o. after midnight -IV fentanyl/oxycodone as needed for moderate to severe pain respectively -Appreciate orthopedics consultative services, follow-up for any further recommendations  Leukocytosis: WBC elevated at 11.1.  Suspect secondary to acute fracture. -Recheck CBC tomorrow morning  Acute respiratory failure with hypoxia COPD: Patient O2 saturations were noted to be as low as 85% on room air in the ED.  CT scan of the chest with contrast did not note any acute abnormalities to correlate with symptoms.  Patient with some mild wheezing appreciated, but seems more so the upper airways.  Denies currently smoking, but did have over 60 smoking pack-year history previously initially suspect this could possibly be iatrogenic in nature given patient had received 100 mcg of fentanyl in route with EMS and additional 50 mcg in the ED. Not totally clear if symptoms are secondary to COPD exacerbation given significant history of tobacco use or related with history of tonsillar cancer status postradiation. -Continuous pulse oximetry with nasal cannula oxygen -Incentive spirometry -Albuterol nebs as needed for shortness of breath/wheezing -Continue to monitor and reassess  Elevated troponin CAD: Acute.  High-sensitivity troponin 33-> 111.   Patient denies any complaints of chest pain.  Noted to have CTO RCA w/ robust collaterals from LAD and 95% LAD stenosis s/p PCI/atherectomy on 04/27/20.  EKG today noted some minimal ST depressions in the lateral leads and check unchanged.  Suspect this could be secondary to demand.  Most recent echo cardiogram from 11/10/2020 noted EF 60-65 with grade 1 diastolic dysfunction. -Continue to trend troponin -Continue aspirin and Plavix -Will formally consult cardiology team if high-sensitivity troponins continue to rise and double  Hypochromic anemia: Hemoglobin 9.7-> 10.5 g/dL Baseline hemoglobin appears to range from 9 to 11 g/dL.  Patient denies any complaints of bleeding. -Continue to monitor H&H  Left shoulder pain: Secondary to fall.  Imaging studies did not note any fracture. -May warrant further work-up if symptoms do not appear to improve.  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  Imaging studies note AAA stable. -Continue outpatient follow-up with vascular surgery  Hyperlipidemia -Continue atorvastatin and Zetia  History of tobacco abuse: Patient reports that he previously had smoked for almost 60 years, but had not smoked since he was diagnosed with cancer 3 years ago.  GERD and history of esophageal stricture -Continue Protonix  DVT prophylaxis:  Lovenox Code Status: Full Family Communication: Patient declined need to update family Disposition Plan: To be determined Consults called: Orthopedic Admission status: Inpatient, require more than 2 midnight stay for need of procedure.  Norval Morton MD [facility]   If 7PM-7AM, please contact night-coverage   12/03/2020, 11:26 AM

## 2020-12-03 NOTE — ED Notes (Signed)
Spoke with cardio at this time and advised that pt could be transferred at this time

## 2020-12-03 NOTE — ED Notes (Signed)
[facility] called. 

## 2020-12-04 ENCOUNTER — Inpatient Hospital Stay (HOSPITAL_COMMUNITY): Payer: Medicare Other | Admitting: Certified Registered"

## 2020-12-04 ENCOUNTER — Inpatient Hospital Stay (HOSPITAL_COMMUNITY): Payer: Medicare Other

## 2020-12-04 ENCOUNTER — Encounter (HOSPITAL_COMMUNITY)
Admission: EM | Disposition: A | Discharge status: Home Health | Payer: Self-pay | Source: Home / Self Care | Attending: Internal Medicine

## 2020-12-04 DIAGNOSIS — E785 Hyperlipidemia, unspecified: Secondary | ICD-10-CM

## 2020-12-04 DIAGNOSIS — R778 Other specified abnormalities of plasma proteins: Secondary | ICD-10-CM | POA: Diagnosis not present

## 2020-12-04 DIAGNOSIS — R7989 Other specified abnormal findings of blood chemistry: Secondary | ICD-10-CM

## 2020-12-04 DIAGNOSIS — K219 Gastro-esophageal reflux disease without esophagitis: Secondary | ICD-10-CM

## 2020-12-04 DIAGNOSIS — J9621 Acute and chronic respiratory failure with hypoxia: Secondary | ICD-10-CM

## 2020-12-04 DIAGNOSIS — R0602 Shortness of breath: Secondary | ICD-10-CM | POA: Diagnosis not present

## 2020-12-04 DIAGNOSIS — I10 Essential (primary) hypertension: Secondary | ICD-10-CM

## 2020-12-04 DIAGNOSIS — J9601 Acute respiratory failure with hypoxia: Secondary | ICD-10-CM

## 2020-12-04 HISTORY — PX: TOTAL HIP ARTHROPLASTY: SHX124

## 2020-12-04 LAB — BASIC METABOLIC PANEL
Anion gap: 9 (ref 5–15)
BUN: 17 mg/dL (ref 8–23)
CO2: 29 mmol/L (ref 22–32)
Calcium: 8.5 mg/dL — ABNORMAL LOW (ref 8.9–10.3)
Chloride: 100 mmol/L (ref 98–111)
Creatinine, Ser: 0.78 mg/dL (ref 0.61–1.24)
GFR, Estimated: >60 mL/min (ref >60)
Glucose, Bld: 95 mg/dL (ref 70–99)
Potassium: 3.8 mmol/L (ref 3.5–5.1)
Sodium: 138 mmol/L (ref 135–145)

## 2020-12-04 LAB — CBC
HCT: 33.6 % — ABNORMAL LOW (ref 39.0–52.0)
Hemoglobin: 9.6 g/dL — ABNORMAL LOW (ref 13.0–17.0)
MCH: 23.5 pg — ABNORMAL LOW (ref 26.0–34.0)
MCHC: 28.6 g/dL — ABNORMAL LOW (ref 30.0–36.0)
MCV: 82.2 fL (ref 80.0–100.0)
Platelets: 136 10*3/uL — ABNORMAL LOW (ref 150–400)
RBC: 4.09 MIL/uL — ABNORMAL LOW (ref 4.22–5.81)
RDW: 15.7 % — ABNORMAL HIGH (ref 11.5–15.5)
WBC: 5.7 10*3/uL (ref 4.0–10.5)
nRBC: 0 % (ref 0.0–0.2)

## 2020-12-04 LAB — IRON AND TIBC
Iron: 22 ug/dL — ABNORMAL LOW (ref 45–182)
Saturation Ratios: 7 % — ABNORMAL LOW (ref 17.9–39.5)
TIBC: 301 ug/dL (ref 250–450)
UIBC: 279 ug/dL

## 2020-12-04 LAB — POCT I-STAT 7, (LYTES, BLD GAS, ICA,H+H)
Acid-Base Excess: 1 mmol/L (ref 0.0–2.0)
Bicarbonate: 27.9 mmol/L (ref 20.0–28.0)
Calcium, Ion: 1.09 mmol/L — ABNORMAL LOW (ref 1.15–1.40)
HCT: 31 % — ABNORMAL LOW (ref 39.0–52.0)
Hemoglobin: 10.5 g/dL — ABNORMAL LOW (ref 13.0–17.0)
O2 Saturation: 86 %
Potassium: 4.5 mmol/L (ref 3.5–5.1)
Sodium: 137 mmol/L (ref 135–145)
TCO2: 29 mmol/L (ref 22–32)
pCO2 arterial: 52.1 mmHg — ABNORMAL HIGH (ref 32.0–48.0)
pH, Arterial: 7.336 — ABNORMAL LOW (ref 7.350–7.450)
pO2, Arterial: 55 mmHg — ABNORMAL LOW (ref 83.0–108.0)

## 2020-12-04 LAB — FERRITIN: Ferritin: 77 ng/mL (ref 24–336)

## 2020-12-04 LAB — VITAMIN B12: Vitamin B-12: 151 pg/mL — ABNORMAL LOW (ref 180–914)

## 2020-12-04 LAB — PREPARE RBC (CROSSMATCH)

## 2020-12-04 LAB — FOLATE: Folate: 8.6 ng/mL (ref >5.9)

## 2020-12-04 LAB — GLUCOSE, CAPILLARY: Glucose-Capillary: 178 mg/dL — ABNORMAL HIGH (ref 70–99)

## 2020-12-04 SURGERY — ARTHROPLASTY, HIP, TOTAL, ANTERIOR APPROACH
Anesthesia: General | Site: Hip | Laterality: Left

## 2020-12-04 MED ORDER — PROPOFOL 10 MG/ML IV BOLUS
INTRAVENOUS | Status: DC | PRN
  Administered 2020-12-04: 60 mg via INTRAVENOUS

## 2020-12-04 MED ORDER — CEFAZOLIN SODIUM-DEXTROSE 2-4 GM/100ML-% IV SOLN
2.0000 g | Freq: Four times a day (QID) | INTRAVENOUS | Status: AC
Start: 2020-12-04 — End: 2020-12-05
  Administered 2020-12-04 (×2): 2 g via INTRAVENOUS
  Filled 2020-12-04 (×2): qty 100

## 2020-12-04 MED ORDER — METHOCARBAMOL 500 MG IVPB - SIMPLE MED
500.0000 mg | Freq: Four times a day (QID) | INTRAVENOUS | Status: DC | PRN
  Administered 2020-12-04: 500 mg via INTRAVENOUS
  Filled 2020-12-04: qty 50

## 2020-12-04 MED ORDER — LACTATED RINGERS IV SOLN: Freq: Once | INTRAVENOUS | Status: AC

## 2020-12-04 MED ORDER — PHENYLEPHRINE HCL (PRESSORS) 10 MG/ML IV SOLN
INTRAVENOUS | Status: AC
  Filled 2020-12-04: qty 2

## 2020-12-04 MED ORDER — FENTANYL CITRATE PF 50 MCG/ML IJ SOSY
PREFILLED_SYRINGE | INTRAMUSCULAR | Status: AC
  Filled 2020-12-04: qty 2

## 2020-12-04 MED ORDER — MENTHOL 3 MG MT LOZG: 1.0000 | LOZENGE | OROMUCOSAL | Status: DC | PRN

## 2020-12-04 MED ORDER — POLYETHYLENE GLYCOL 3350 17 G PO PACK: 17.0000 g | PACK | Freq: Every day | ORAL | Status: DC | PRN

## 2020-12-04 MED ORDER — ONDANSETRON HCL 4 MG/2ML IJ SOLN
INTRAMUSCULAR | Status: AC
  Filled 2020-12-04: qty 2

## 2020-12-04 MED ORDER — SODIUM CHLORIDE 0.9 % IR SOLN
Status: DC | PRN
  Administered 2020-12-04: 3000 mL
  Administered 2020-12-04 (×2): 1000 mL

## 2020-12-04 MED ORDER — DEXAMETHASONE SODIUM PHOSPHATE 10 MG/ML IJ SOLN
INTRAMUSCULAR | Status: AC
  Filled 2020-12-04: qty 1

## 2020-12-04 MED ORDER — OXYCODONE HCL 5 MG PO TABS
10.0000 mg | ORAL_TABLET | ORAL | Status: DC | PRN
  Administered 2020-12-04 – 2020-12-05 (×2): 15 mg via ORAL
  Administered 2020-12-05: 5 mg via ORAL
  Administered 2020-12-05 – 2020-12-08 (×13): 15 mg via ORAL
  Filled 2020-12-04 (×15): qty 3

## 2020-12-04 MED ORDER — FENTANYL CITRATE (PF) 100 MCG/2ML IJ SOLN
INTRAMUSCULAR | Status: AC
  Filled 2020-12-04: qty 2

## 2020-12-04 MED ORDER — IPRATROPIUM-ALBUTEROL 0.5-2.5 (3) MG/3ML IN SOLN
3.0000 mL | Freq: Three times a day (TID) | RESPIRATORY_TRACT | Status: DC
  Administered 2020-12-04 – 2020-12-05 (×3): 3 mL via RESPIRATORY_TRACT
  Filled 2020-12-04 (×3): qty 3

## 2020-12-04 MED ORDER — SORBITOL 70 % SOLN
30.0000 mL | Freq: Every day | Status: DC | PRN
  Filled 2020-12-04: qty 30

## 2020-12-04 MED ORDER — PROPOFOL 10 MG/ML IV BOLUS
INTRAVENOUS | Status: AC
  Filled 2020-12-04: qty 20

## 2020-12-04 MED ORDER — FENTANYL CITRATE (PF) 100 MCG/2ML IJ SOLN
INTRAMUSCULAR | Status: DC | PRN
  Administered 2020-12-04: 25 ug via INTRAVENOUS
  Administered 2020-12-04 (×2): 50 ug via INTRAVENOUS
  Administered 2020-12-04 (×2): 25 ug via INTRAVENOUS
  Administered 2020-12-04: 75 ug via INTRAVENOUS

## 2020-12-04 MED ORDER — LACTATED RINGERS IV SOLN: INTRAVENOUS | Status: DC | PRN

## 2020-12-04 MED ORDER — ROCURONIUM BROMIDE 10 MG/ML (PF) SYRINGE
PREFILLED_SYRINGE | INTRAVENOUS | Status: DC | PRN
  Administered 2020-12-04: 50 mg via INTRAVENOUS

## 2020-12-04 MED ORDER — SUCCINYLCHOLINE CHLORIDE 200 MG/10ML IV SOSY
PREFILLED_SYRINGE | INTRAVENOUS | Status: DC | PRN
  Administered 2020-12-04: 100 mg via INTRAVENOUS

## 2020-12-04 MED ORDER — SODIUM CHLORIDE (PF) 0.9 % IJ SOLN
INTRAMUSCULAR | Status: DC | PRN
  Administered 2020-12-04: 30 mL

## 2020-12-04 MED ORDER — ETOMIDATE 2 MG/ML IV SOLN
INTRAVENOUS | Status: DC | PRN
  Administered 2020-12-04: 12 mg via INTRAVENOUS

## 2020-12-04 MED ORDER — BUPIVACAINE-EPINEPHRINE 0.25% -1:200000 IJ SOLN
INTRAMUSCULAR | Status: DC | PRN
  Administered 2020-12-04: 30 mL

## 2020-12-04 MED ORDER — SODIUM CHLORIDE 0.9 % IV SOLN: INTRAVENOUS | Status: DC | PRN

## 2020-12-04 MED ORDER — PHENYLEPHRINE HCL-NACL 20-0.9 MG/250ML-% IV SOLN
INTRAVENOUS | Status: DC | PRN
  Administered 2020-12-04: 40 ug/min via INTRAVENOUS

## 2020-12-04 MED ORDER — OXYCODONE HCL 5 MG PO TABS
5.0000 mg | ORAL_TABLET | ORAL | Status: DC | PRN
  Administered 2020-12-04 – 2020-12-05 (×3): 10 mg via ORAL
  Filled 2020-12-04: qty 1
  Filled 2020-12-04 (×4): qty 2

## 2020-12-04 MED ORDER — FENTANYL CITRATE (PF) 250 MCG/5ML IJ SOLN
INTRAMUSCULAR | Status: AC
  Filled 2020-12-04: qty 5

## 2020-12-04 MED ORDER — DOCUSATE SODIUM 100 MG PO CAPS
100.0000 mg | ORAL_CAPSULE | Freq: Two times a day (BID) | ORAL | Status: DC
  Administered 2020-12-04 – 2020-12-06 (×4): 100 mg via ORAL
  Filled 2020-12-04 (×7): qty 1

## 2020-12-04 MED ORDER — KETOROLAC TROMETHAMINE 30 MG/ML IJ SOLN
INTRAMUSCULAR | Status: AC
  Filled 2020-12-04: qty 1

## 2020-12-04 MED ORDER — FLEET ENEMA 7-19 GM/118ML RE ENEM: 1.0000 | ENEMA | Freq: Once | RECTAL | Status: DC | PRN

## 2020-12-04 MED ORDER — KETOROLAC TROMETHAMINE 30 MG/ML IJ SOLN
INTRAMUSCULAR | Status: DC | PRN
  Administered 2020-12-04: 30 mg

## 2020-12-04 MED ORDER — FENTANYL CITRATE PF 50 MCG/ML IJ SOSY
25.0000 ug | PREFILLED_SYRINGE | INTRAMUSCULAR | Status: DC | PRN
  Administered 2020-12-04 (×3): 50 ug via INTRAVENOUS

## 2020-12-04 MED ORDER — ASPIRIN 81 MG PO CHEW
81.0000 mg | CHEWABLE_TABLET | Freq: Two times a day (BID) | ORAL | Status: DC
  Administered 2020-12-04 – 2020-12-05 (×2): 81 mg via ORAL
  Filled 2020-12-04 (×2): qty 1

## 2020-12-04 MED ORDER — PHENOL 1.4 % MT LIQD: 1.0000 | OROMUCOSAL | Status: DC | PRN

## 2020-12-04 MED ORDER — TRANEXAMIC ACID-NACL 1000-0.7 MG/100ML-% IV SOLN
INTRAVENOUS | Status: AC
  Filled 2020-12-04: qty 100

## 2020-12-04 MED ORDER — ISOPROPYL ALCOHOL 70 % SOLN
Status: AC
  Filled 2020-12-04: qty 480

## 2020-12-04 MED ORDER — ROCURONIUM BROMIDE 10 MG/ML (PF) SYRINGE
PREFILLED_SYRINGE | INTRAVENOUS | Status: AC
  Filled 2020-12-04: qty 10

## 2020-12-04 MED ORDER — GUAIFENESIN ER 600 MG PO TB12
1200.0000 mg | ORAL_TABLET | Freq: Two times a day (BID) | ORAL | Status: DC
  Administered 2020-12-04 – 2020-12-08 (×8): 1200 mg via ORAL
  Filled 2020-12-04 (×8): qty 2

## 2020-12-04 MED ORDER — SUGAMMADEX SODIUM 200 MG/2ML IV SOLN
INTRAVENOUS | Status: DC | PRN
  Administered 2020-12-04: 200 mg via INTRAVENOUS

## 2020-12-04 MED ORDER — LIDOCAINE 2% (20 MG/ML) 5 ML SYRINGE
INTRAMUSCULAR | Status: DC | PRN
  Administered 2020-12-04: 80 mg via INTRAVENOUS

## 2020-12-04 MED ORDER — ONDANSETRON HCL 4 MG/2ML IJ SOLN: 4.0000 mg | Freq: Four times a day (QID) | INTRAMUSCULAR | Status: DC | PRN

## 2020-12-04 MED ORDER — ACETAMINOPHEN 10 MG/ML IV SOLN
INTRAVENOUS | Status: AC
  Filled 2020-12-04: qty 100

## 2020-12-04 MED ORDER — PHENYLEPHRINE 40 MCG/ML (10ML) SYRINGE FOR IV PUSH (FOR BLOOD PRESSURE SUPPORT)
PREFILLED_SYRINGE | INTRAVENOUS | Status: DC | PRN
  Administered 2020-12-04: 80 ug via INTRAVENOUS

## 2020-12-04 MED ORDER — TRANEXAMIC ACID-NACL 1000-0.7 MG/100ML-% IV SOLN
1000.0000 mg | Freq: Once | INTRAVENOUS | Status: AC
  Administered 2020-12-04: 1000 mg via INTRAVENOUS
  Filled 2020-12-04: qty 100

## 2020-12-04 MED ORDER — METOCLOPRAMIDE HCL 5 MG/ML IJ SOLN: 5.0000 mg | Freq: Three times a day (TID) | INTRAMUSCULAR | Status: DC | PRN

## 2020-12-04 MED ORDER — DEXAMETHASONE SODIUM PHOSPHATE 10 MG/ML IJ SOLN
INTRAMUSCULAR | Status: DC | PRN
  Administered 2020-12-04: 10 mg via INTRAVENOUS

## 2020-12-04 MED ORDER — BUDESONIDE 0.5 MG/2ML IN SUSP
0.5000 mg | Freq: Two times a day (BID) | RESPIRATORY_TRACT | Status: DC
  Administered 2020-12-04 – 2020-12-08 (×8): 0.5 mg via RESPIRATORY_TRACT
  Filled 2020-12-04 (×9): qty 2

## 2020-12-04 MED ORDER — SODIUM CHLORIDE 0.9 % IV SOLN: INTRAVENOUS | Status: DC

## 2020-12-04 MED ORDER — ALBUMIN HUMAN 5 % IV SOLN: INTRAVENOUS | Status: DC | PRN

## 2020-12-04 MED ORDER — METHOCARBAMOL 500 MG IVPB - SIMPLE MED
INTRAVENOUS | Status: AC
  Filled 2020-12-04: qty 50

## 2020-12-04 MED ORDER — ACETAMINOPHEN 10 MG/ML IV SOLN
INTRAVENOUS | Status: DC | PRN
  Administered 2020-12-04: 1000 mg via INTRAVENOUS

## 2020-12-04 MED ORDER — CLOPIDOGREL BISULFATE 75 MG PO TABS
75.0000 mg | ORAL_TABLET | Freq: Every day | ORAL | Status: DC
  Administered 2020-12-05 – 2020-12-06 (×2): 75 mg via ORAL
  Filled 2020-12-04 (×2): qty 1

## 2020-12-04 MED ORDER — SUCCINYLCHOLINE CHLORIDE 200 MG/10ML IV SOSY
PREFILLED_SYRINGE | INTRAVENOUS | Status: AC
  Filled 2020-12-04: qty 10

## 2020-12-04 MED ORDER — ALBUTEROL SULFATE HFA 108 (90 BASE) MCG/ACT IN AERS
INHALATION_SPRAY | RESPIRATORY_TRACT | Status: DC | PRN
  Administered 2020-12-04 (×3): 2 via RESPIRATORY_TRACT

## 2020-12-04 MED ORDER — HYDROMORPHONE HCL 1 MG/ML IJ SOLN
0.5000 mg | INTRAMUSCULAR | Status: DC | PRN
  Administered 2020-12-05 – 2020-12-08 (×6): 1 mg via INTRAVENOUS
  Filled 2020-12-04 (×6): qty 1

## 2020-12-04 MED ORDER — SODIUM CHLORIDE (PF) 0.9 % IJ SOLN
INTRAMUSCULAR | Status: AC
  Filled 2020-12-04: qty 30

## 2020-12-04 MED ORDER — SENNA 8.6 MG PO TABS
1.0000 | ORAL_TABLET | Freq: Two times a day (BID) | ORAL | Status: DC
  Administered 2020-12-04 – 2020-12-06 (×4): 8.6 mg via ORAL
  Filled 2020-12-04 (×7): qty 1

## 2020-12-04 MED ORDER — PERFLUTREN LIPID MICROSPHERE
1.0000 mL | INTRAVENOUS | Status: AC | PRN
  Administered 2020-12-04: 2 mL via INTRAVENOUS
  Filled 2020-12-04: qty 10

## 2020-12-04 MED ORDER — WATER FOR IRRIGATION, STERILE IR SOLN
Status: DC | PRN
  Administered 2020-12-04: 2000 mL

## 2020-12-04 MED ORDER — ALBUMIN HUMAN 5 % IV SOLN
INTRAVENOUS | Status: AC
  Filled 2020-12-04: qty 250

## 2020-12-04 MED ORDER — ISOPROPYL ALCOHOL 70 % SOLN
Status: DC | PRN
  Administered 2020-12-04: 1 via TOPICAL

## 2020-12-04 MED ORDER — METOCLOPRAMIDE HCL 5 MG PO TABS: 5.0000 mg | ORAL_TABLET | Freq: Three times a day (TID) | ORAL | Status: DC | PRN

## 2020-12-04 MED ORDER — ONDANSETRON HCL 4 MG PO TABS: 4.0000 mg | ORAL_TABLET | Freq: Four times a day (QID) | ORAL | Status: DC | PRN

## 2020-12-04 MED ORDER — TRANEXAMIC ACID 1000 MG/10ML IV SOLN
INTRAVENOUS | Status: DC | PRN
  Administered 2020-12-04: 1000 mg via INTRAVENOUS

## 2020-12-04 MED ORDER — BUPIVACAINE-EPINEPHRINE (PF) 0.25% -1:200000 IJ SOLN
INTRAMUSCULAR | Status: AC
  Filled 2020-12-04: qty 30

## 2020-12-04 MED ORDER — ONDANSETRON HCL 4 MG/2ML IJ SOLN
INTRAMUSCULAR | Status: DC | PRN
  Administered 2020-12-04: 4 mg via INTRAVENOUS

## 2020-12-04 MED ORDER — ACETAMINOPHEN 325 MG PO TABS
325.0000 mg | ORAL_TABLET | Freq: Four times a day (QID) | ORAL | Status: DC | PRN
  Administered 2020-12-05: 650 mg via ORAL
  Filled 2020-12-04: qty 2

## 2020-12-04 MED ORDER — METHOCARBAMOL 500 MG PO TABS
500.0000 mg | ORAL_TABLET | Freq: Four times a day (QID) | ORAL | Status: DC | PRN
  Administered 2020-12-05 – 2020-12-06 (×5): 500 mg via ORAL
  Filled 2020-12-04 (×5): qty 1

## 2020-12-04 MED ORDER — ALBUMIN HUMAN 5 % IV SOLN
12.5000 g | Freq: Once | INTRAVENOUS | Status: AC
  Administered 2020-12-04: 12.5 g via INTRAVENOUS

## 2020-12-04 MED ORDER — FENTANYL CITRATE PF 50 MCG/ML IJ SOSY
PREFILLED_SYRINGE | INTRAMUSCULAR | Status: AC
  Filled 2020-12-04: qty 1

## 2020-12-04 SURGICAL SUPPLY — 64 items
ACE SHELL 54 4H HIP (Shell) ×2 IMPLANT
ADH SKN CLS APL DERMABOND .7 (GAUZE/BANDAGES/DRESSINGS) ×1
APL PRP STRL LF DISP 70% ISPRP (MISCELLANEOUS) ×1
BAG COUNTER SPONGE SURGICOUNT (BAG) IMPLANT
BAG DECANTER FOR FLEXI CONT (MISCELLANEOUS) IMPLANT
BAG SPEC THK2 15X12 ZIP CLS (MISCELLANEOUS)
BAG SPNG CNTER NS LX DISP (BAG)
BAG ZIPLOCK 12X15 (MISCELLANEOUS) IMPLANT
CHLORAPREP W/TINT 26 (MISCELLANEOUS) ×2 IMPLANT
COVER PERINEAL POST (MISCELLANEOUS) ×2 IMPLANT
COVER SURGICAL LIGHT HANDLE (MISCELLANEOUS) ×2 IMPLANT
DECANTER SPIKE VIAL GLASS SM (MISCELLANEOUS) ×2 IMPLANT
DERMABOND ADVANCED (GAUZE/BANDAGES/DRESSINGS) ×1
DERMABOND ADVANCED .7 DNX12 (GAUZE/BANDAGES/DRESSINGS) ×2 IMPLANT
DRAPE IMP U-DRAPE 54X76 (DRAPES) ×2 IMPLANT
DRAPE SHEET LG 3/4 BI-LAMINATE (DRAPES) ×6 IMPLANT
DRAPE STERI IOBAN 125X83 (DRAPES) ×2 IMPLANT
DRAPE U-SHAPE 47X51 STRL (DRAPES) ×4 IMPLANT
DRESSING AQUACEL AG SP 3.5X10 (GAUZE/BANDAGES/DRESSINGS) IMPLANT
DRSG AQUACEL AG ADV 3.5X10 (GAUZE/BANDAGES/DRESSINGS) ×2 IMPLANT
DRSG AQUACEL AG SP 3.5X10 (GAUZE/BANDAGES/DRESSINGS) ×2
ELECT REM PT RETURN 15FT ADLT (MISCELLANEOUS) ×2 IMPLANT
GAUZE SPONGE 4X4 12PLY STRL (GAUZE/BANDAGES/DRESSINGS) ×2 IMPLANT
GLOVE SRG 8 PF TXTR STRL LF DI (GLOVE) ×1 IMPLANT
GLOVE SURG ENC MOIS LTX SZ8.5 (GLOVE) ×4 IMPLANT
GLOVE SURG ENC TEXT LTX SZ7.5 (GLOVE) ×4 IMPLANT
GLOVE SURG UNDER POLY LF SZ8 (GLOVE) ×2
GLOVE SURG UNDER POLY LF SZ8.5 (GLOVE) ×2 IMPLANT
GOWN SPEC L3 XXLG W/TWL (GOWN DISPOSABLE) ×2 IMPLANT
GOWN STRL REUS W/TWL XL LVL3 (GOWN DISPOSABLE) ×2 IMPLANT
HANDPIECE INTERPULSE COAX TIP (DISPOSABLE) ×2
HEAD FEM -6XOFST 36XMDLR (Orthopedic Implant) IMPLANT
HEAD MODULAR 36MM (Orthopedic Implant) ×2 IMPLANT
HOLDER FOLEY CATH W/STRAP (MISCELLANEOUS) ×2 IMPLANT
HOOD PEEL AWAY FLYTE STAYCOOL (MISCELLANEOUS) ×8 IMPLANT
JET LAVAGE IRRISEPT WOUND (IRRIGATION / IRRIGATOR) ×2
KIT TURNOVER KIT A (KITS) IMPLANT
LAVAGE JET IRRISEPT WOUND (IRRIGATION / IRRIGATOR) ×1 IMPLANT
LINER ACETAB VIT E +5 36 F (Liner) ×1 IMPLANT
MANIFOLD NEPTUNE II (INSTRUMENTS) ×2 IMPLANT
MARKER SKIN DUAL TIP RULER LAB (MISCELLANEOUS) ×2 IMPLANT
NDL SAFETY ECLIPSE 18X1.5 (NEEDLE) ×1 IMPLANT
NDL SPNL 18GX3.5 QUINCKE PK (NEEDLE) ×1 IMPLANT
NEEDLE HYPO 18GX1.5 SHARP (NEEDLE) ×2
NEEDLE SPNL 18GX3.5 QUINCKE PK (NEEDLE) ×2 IMPLANT
PACK ANTERIOR HIP CUSTOM (KITS) ×2 IMPLANT
PENCIL SMOKE EVACUATOR (MISCELLANEOUS) IMPLANT
SAW OSC TIP CART 19.5X105X1.3 (SAW) ×2 IMPLANT
SEALER BIPOLAR AQUA 6.0 (INSTRUMENTS) ×2 IMPLANT
SET HNDPC FAN SPRY TIP SCT (DISPOSABLE) ×1 IMPLANT
SHELL ACETAB 54 4H HIP (Shell) IMPLANT
STEM FEMORAL 17X154 (Stem) ×1 IMPLANT
SUT MNCRL AB 3-0 PS2 18 (SUTURE) ×2 IMPLANT
SUT MNCRL AB 4-0 PS2 18 (SUTURE) ×2 IMPLANT
SUT MON AB 2-0 CT1 36 (SUTURE) ×4 IMPLANT
SUT STRATAFIX PDO 1 14 VIOLET (SUTURE) ×2
SUT STRATFX PDO 1 14 VIOLET (SUTURE) ×1
SUT VIC AB 2-0 CT1 27 (SUTURE) ×2
SUT VIC AB 2-0 CT1 TAPERPNT 27 (SUTURE) ×1 IMPLANT
SUTURE STRATFX PDO 1 14 VIOLET (SUTURE) ×1 IMPLANT
SYR 3ML LL SCALE MARK (SYRINGE) ×2 IMPLANT
TRAY FOLEY MTR SLVR 16FR STAT (SET/KITS/TRAYS/PACK) IMPLANT
TUBE SUCTION HIGH CAP CLEAR NV (SUCTIONS) ×2 IMPLANT
WATER STERILE IRR 1000ML POUR (IV SOLUTION) ×2 IMPLANT

## 2020-12-04 NOTE — TOC Initial Note (Signed)
Transition of Care [facility]) - Initial/Assessment Note    Patient Details  Name: Brandon [name] MRN: [id number] Date of Birth: October 09, 1940  [facility] of Care [facility]) CM/SW Contact:    Dessa Phi, RN Phone Number: 12/04/2020, 4:03 PM  Clinical Narrative: From home w/spouse-attempted to contact spouse-no answer. S/p L THA await PT cons & recc.continue to assess for d/c plans.                  Expected Discharge Plan: Skilled Nursing Facility Barriers to Discharge: Continued Medical Work up   Patient Goals and CMS Choice   CMS Medicare.gov Compare Post Acute Care list provided to:: Patient Represenative (must comment) Choice offered to / list presented to : Spouse  Expected Discharge Plan and Services Expected Discharge Plan: [place]   Discharge Planning Services: CM Consult Post Acute Care Choice: [place] Living arrangements for the past 2 months: Single Family Home                                      Prior Living Arrangements/Services Living arrangements for the past 2 months: Single Family Home Lives with:: Spouse Patient language and need for interpreter reviewed:: Yes Do you feel safe going back to the place where you live?: Yes      Need for Family Participation in Patient Care: No (Comment) Care giver support system in place?: Yes (comment)   Criminal Activity/Legal Involvement Pertinent to Current Situation/Hospitalization: No - Comment as needed  Activities of Daily Living Home Assistive Devices/Equipment: None ADL Screening (condition at time of admission) Patient's cognitive ability adequate to safely complete daily activities?: Yes Is the patient deaf or have difficulty hearing?: No Does the patient have difficulty seeing, even when wearing glasses/contacts?: Yes Does the patient have difficulty concentrating, remembering, or making decisions?: No Patient able to express need for assistance with ADLs?: Yes Does the  patient have difficulty dressing or bathing?: No Independently performs ADLs?: No Communication: Needs assistance Is this a change from baseline?: Change from baseline, expected to last <3 days Dressing (OT): Needs assistance Is this a change from baseline?: Change from baseline, expected to last <3days Grooming: Needs assistance Is this a change from baseline?: Change from baseline, expected to last <3 days Feeding: Independent Bathing: Needs assistance Is this a change from baseline?: Change from baseline, expected to last <3 days Toileting: Needs assistance Is this a change from baseline?: Change from baseline, expected to last <3 days In/Out Bed: Needs assistance Is this a change from baseline?: Change from baseline, expected to last <3 days Walks in Home: Independent Is this a change from baseline?: Change from baseline, expected to last <3 days Does the patient have difficulty walking or climbing stairs?: No Weakness of Legs: Left Weakness of Arms/Hands: None  Permission Sought/Granted Permission sought to share information with : Case Manager Permission granted to share information with : Yes, Verbal Permission Granted  Share Information with NAME: Case Manager           Emotional Assessment Appearance:: Appears stated age   Affect (typically observed): Accepting Orientation: : Oriented to Self, Oriented to Place, Oriented to  Time Alcohol / Substance Use: Not Applicable Psych Involvement: No (comment)  Admission diagnosis:  Trauma [T14.90XA] Left displaced femoral neck fracture ([place]) [S72.002A] Closed displaced fracture of left femoral neck ([place]) [S72.002A] Patient Active Problem List   Diagnosis Date Noted  Acute on chronic respiratory failure with hypoxia (HCC)    Elevated troponin    Closed displaced fracture of left femoral neck ([place]) 12/03/2020   Fall at home, initial encounter 12/03/2020   Chronic diastolic heart failure ([place]) 12/03/2020   Steatosis of  liver 11/01/2020   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   Hepatitis B core antibody positive 11/01/2020   Benign prostatic hyperplasia 11/01/2020   Chronic obstructive pulmonary disease ([place]) 07/20/2020   History of placement of stent in LAD coronary artery 07/20/2020   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   TIA (transient ischemic attack)    Esophageal stricture    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   Primary osteoarthritis of both hands 01/11/2020   Piriformis syndrome of both sides 09/23/2019   Pain of both shoulder joints 07/29/2019   Sciatica associated with disorder of lumbar spine 04/24/2019   Primary osteoarthritis involving multiple joints 04/24/2019   Chronic pain syndrome 04/24/2019   Anxiety and depression 04/15/2018   Oropharyngeal dysphagia 03/11/2018   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   Liver abscess 07/10/2016   Aneurysm of infrarenal abdominal aorta 07/10/2016   Hypochromic anemia 07/10/2016   Hypertension 05/19/2013   Osteoarthritis 05/29/2009   Psoriasis 01/13/2009   Coronary artery disease 09/15/2008   Severe aortic stenosis 01/28/2008   Diverticulosis of colon 08/27/2007   History of benign prostatic hyperplasia 08/27/2007   Hyperlipidemia 03/19/2007   Gastroesophageal reflux disease 03/19/2007   PCP:  Haydee Salter, MD Pharmacy:   [name] ORDER) [place], [place] [address] Phone: [phone] Fax: [phone]  [facility] [place], [place] - [address] [place] [address] Phone: [phone] Fax: [phone]     Social Determinants of Health (SDOH) Interventions    Readmission Risk Interventions No flowsheet data found.

## 2020-12-04 NOTE — Anesthesia Postprocedure Evaluation (Signed)
Anesthesia Post Note  Patient: Brandon [name]  Procedure(s) Performed: TOTAL HIP ARTHROPLASTY ANTERIOR APPROACH (Left: Hip)     Patient location during evaluation: PACU Anesthesia Type: General Level of consciousness: awake Pain management: pain level controlled Vital Signs Assessment: post-procedure vital signs reviewed and stable Respiratory status: spontaneous breathing Cardiovascular status: stable Postop Assessment: no apparent nausea or vomiting Anesthetic complications: no   No notable events documented.  Last Vitals:  Vitals:   12/04/20 1210 12/04/20 1215  BP: (!) 101/57   Pulse: 82 81  Resp: 15 18  Temp:    SpO2: 96% 95%    Last Pain:  Vitals:   12/04/20 1215  TempSrc:   PainSc: 5                  Charlett Merkle

## 2020-12-04 NOTE — Transfer of Care (Signed)
Immediate Anesthesia Transfer of Care Note  Patient: Virgel Paling  Procedure(s) Performed: TOTAL HIP ARTHROPLASTY ANTERIOR APPROACH (Left: Hip)  Patient Location: PACU  Anesthesia Type:General  Level of Consciousness: awake, alert , oriented and patient cooperative  Airway & Oxygen Therapy: Patient Spontanous Breathing and Patient connected to face mask oxygen  Post-op Assessment: Report given to RN and Post -op Vital signs reviewed and stable  Post vital signs: Reviewed and stable  Last Vitals:  Vitals Value Taken Time  BP 96/56 12/04/20 1118  Temp    Pulse 76 12/04/20 1126  Resp 20 12/04/20 1126  SpO2 91 % 12/04/20 1126  Vitals shown include unvalidated device data.  Last Pain:  Vitals:   12/04/20 0458  TempSrc:   PainSc: Asleep         Complications: No notable events documented.

## 2020-12-04 NOTE — Progress Notes (Signed)
*  PRELIMINARY RESULTS* Echocardiogram 2D Echocardiogram has been performed with Definity.  Samuel Germany 12/04/2020, 5:10 PM

## 2020-12-04 NOTE — Op Note (Signed)
OPERATIVE REPORT  SURGEON: Rod Can, MD   ASSISTANT: Estill Bamberg Ward, PA-C.  PREOPERATIVE DIAGNOSIS: Displaced Left femoral neck fracture.   POSTOPERATIVE DIAGNOSIS: Displaced Left femoral neck fracture.   PROCEDURE: Left total hip arthroplasty, anterior approach.   IMPLANTS: Biomet Taperloc Reduced Distal stem, size 17 x 154 mm, high offset. Biomet G7 OsseoTi Cup, size 54 mm. Biomet Vivacit-E liner, size 36 mm, F, +5 neutral. Biomet metal head ball, size 36 - 6 mm.  ANESTHESIA:  General  ANTIBIOTICS: 2g ancef.  ESTIMATED BLOOD LOSS:-1100 mL    BLOOD PRODUCTS: 2 units PRBCs.  DRAINS: None.  COMPLICATIONS: None   CONDITION: PACU - hemodynamically stable.   BRIEF CLINICAL NOTE: Brandon [name] is a 80 y.o. male with a displaced Left femoral neck fracture. The patient was admitted to the hospitalist service and underwent perioperative risk stratification and medical optimization.  Due to the patient's significant cardiac history and elevated troponin, cardiology evaluated the patient preoperatively.  They determined that his troponin elevation was due to demand ischemia.  He had a recent echocardiogram showing 60 to 65% EF.  He was deemed high risk, but medically optimized for surgery.  The risks, benefits, and alternatives to total hip arthroplasty were explained, and the patient elected to proceed.  PROCEDURE IN DETAIL: The patient was taken to the operating room and general anesthesia was induced on the hospital bed.  The patient was then positioned on the [place] table.  All bony prominences were well padded.  The hip was prepped and draped in the normal sterile surgical fashion.  A time-out was called verifying side and site of surgery. Antibiotics were given within 60 minutes of beginning the procedure.   Bikini incision was made, and the direct anterior approach to the hip was performed through the Hueter interval.  Lateral femoral circumflex vessels were treated with the  Auqumantys. The anterior capsule was exposed and an inverted T capsulotomy was made.  Fracture hematoma was encountered and evacuated. The patient was found to have a comminuted Left subcapital femoral neck fracture.  I freshened the femoral neck cut with a saw.  I removed the femoral neck fragment.  A corkscrew was placed into the head and the head was removed.  This was passed to the back table and was measured. The pubofemoral ligament was released subperiosteally to the lesser trochanter.  Acetabular exposure was achieved, and the pulvinar and labrum were excised. Sequential reaming of the acetabulum was then performed up to a size 53 mm reamer under direct visulization. A 54 mm cup was then opened and impacted into place at approximately 40 degrees of abduction and 20 degrees of anteversion. The final polyethylene liner was impacted into place and acetabular osteophytes were removed.    I then gained femoral exposure taking care to protect the abductors and greater trochanter.  This was performed using standard external rotation, extension, and adduction.  A cookie cutter was used to enter the femoral canal, and then the femoral canal finder was placed.  Sequential broaching was performed up to a size 17.  Calcar planer was used on the femoral neck remnant.  I placed a high offset neck and a trial head ball.  The hip was reduced.  Leg lengths and offset were checked fluoroscopically.  The hip was dislocated and trial components were removed.  The final implants were placed, and the hip was reduced.  Fluoroscopy was used to confirm component position and leg lengths.  At 90 degrees of external rotation and  full extension, the hip was stable to an anterior directed force.   The wound was copiously irrigated with Irrisept solution and normal saline using pule lavage.  Marcaine solution was injected into the periarticular soft tissue.  The wound was closed in layers using #1 Stratafix for the fascia, 2-0  Vicryl for the subcutaneous fat, 2-0 Monocryl for the deep dermal layer, and staples + Dermabond for the skin.  Once the glue was fully dried, an Aquacell Ag dressing was applied.  The patient was transported to the recovery room in stable condition.  Sponge, needle, and instrument counts were correct at the end of the case x2.  The patient tolerated the procedure well and there were no known complications.  Please note that a surgical assistant was a medical necessity for this procedure to perform it in a safe and expeditious manner. Assistant was necessary to provide appropriate retraction of vital neurovascular structures, to prevent femoral fracture, and to allow for anatomic placement of the prosthesis.  POSTOPERATIVE PLAN: The patient will be readmitted to the hospitalist service.  Continue cardiac monitor and continuous pulse oximetry.  Weightbearing as tolerated left lower extremity with a walker.  Mobilize out of bed with PT/OT.  Continue aspirin and Plavix for DVT prophylaxis.  Routine prophylactic IV antibiotics x23 hours.  Pain control.  Patient will undergo disposition planning.  Return to the office in 2 weeks for suture removal.

## 2020-12-04 NOTE — Anesthesia Preprocedure Evaluation (Signed)
Anesthesia Evaluation  Patient identified by MRN, date of birth, ID band Patient awake    Reviewed: Allergy & Precautions, NPO status , Patient's Chart, lab work & pertinent test results  Airway Mallampati: II  TM Distance: >3 FB     Dental   Pulmonary COPD, former smoker,    breath sounds clear to auscultation       Cardiovascular hypertension, + CAD  (-) dysrhythmias + Valvular Problems/Murmurs AS  Rhythm:Regular Rate:Normal     Neuro/Psych TIA Neuromuscular disease    GI/Hepatic hiatal hernia, GERD  ,(+) Hepatitis -  Endo/Other    Renal/GU      Musculoskeletal  (+) Arthritis ,   Abdominal   Peds  Hematology   Anesthesia Other Findings   Reproductive/Obstetrics                             Anesthesia Physical Anesthesia Plan  ASA: 3  Anesthesia Plan: General   Post-op Pain Management:    Induction:   PONV Risk Score and Plan: 3 and Ondansetron, Dexamethasone and Treatment may vary due to age or medical condition  Airway Management Planned: Oral ETT  Additional Equipment:   Intra-op Plan:   Post-operative Plan: Possible Post-op intubation/ventilation  Informed Consent: I have reviewed the patients History and Physical, chart, labs and discussed the procedure including the risks, benefits and alternatives for the proposed anesthesia with the patient or authorized representative who has indicated his/her understanding and acceptance.     Dental advisory given  Plan Discussed with: CRNA and Anesthesiologist  Anesthesia Plan Comments:         Anesthesia Quick Evaluation

## 2020-12-04 NOTE — Anesthesia Procedure Notes (Signed)
Procedure Name: Intubation Date/Time: 12/04/2020 8:46 AM Performed by: Cleda Daub, CRNA Pre-anesthesia Checklist: Patient identified, Emergency Drugs available, Suction available and Patient being monitored Patient Re-evaluated:Patient Re-evaluated prior to induction Oxygen Delivery Method: Circle system utilized Preoxygenation: Pre-oxygenation with 100% oxygen Induction Type: IV induction and Rapid sequence Laryngoscope Size: Glidescope and 4 Tube type: Oral Tube size: 7.5 mm Number of attempts: 1 Airway Equipment and Method: Stylet and Oral airway Placement Confirmation: ETT inserted through vocal cords under direct vision, positive ETCO2 and breath sounds checked- equal and bilateral Secured at: 21 cm Tube secured with: Tape Dental Injury: Teeth and Oropharynx as per pre-operative assessment

## 2020-12-04 NOTE — Progress Notes (Signed)
Orthopedic Tech Progress Note Patient Details:  Brandon [name] March 20, 1940 [id number]  Ortho Devices Ortho Device/Splint Location: trapeze bar Ortho Device/Splint Interventions: Application   Post Interventions Patient Tolerated: Well Instructions Provided: Care of device, Adjustment of device  [name] 12/04/2020, 5:48 PM

## 2020-12-04 NOTE — Discharge Instructions (Signed)
? ?Dr. Sherrelle Prochazka ?Joint Replacement Specialist ?[facility] ?[address] ?[place], [place] [address] ?[phone] ? ? ?TOTAL HIP REPLACEMENT POSTOPERATIVE DIRECTIONS ? ? ? ?Hip Rehabilitation, Guidelines Following Surgery  ? ?WEIGHT BEARING ?Weight bearing as tolerated with assist device (walker, cane, etc) as directed, use it as long as suggested by your surgeon or therapist, typically at least 4-6 weeks. ? ?The results of a hip operation are greatly improved after range of motion and muscle strengthening exercises. Follow all safety measures which are given to protect your hip. If any of these exercises cause increased pain or swelling in your joint, decrease the amount until you are comfortable again. Then slowly increase the exercises. Call your caregiver if you have problems or questions.  ? ?HOME CARE INSTRUCTIONS  ?Most of the following instructions are designed to prevent the dislocation of your new hip.  ?Remove items at home which could result in a fall. This includes throw rugs or furniture in walking pathways.  ?Continue medications as instructed at time of discharge. ?You may have some home medications which will be placed on hold until you complete the course of blood thinner medication. ?You may start showering once you are discharged home. Do not remove your dressing. ?Do not put on socks or shoes without following the instructions of your caregivers.   ?Sit on chairs with arms. Use the chair arms to help push yourself up when arising.  ?Arrange for the use of a toilet seat elevator so you are not sitting low.  ?Walk with walker as instructed.  ?You may resume a sexual relationship in one month or when given the OK by your caregiver.  ?Use walker as long as suggested by your caregivers.  ?You may put full weight on your legs and walk as much as is comfortable. ?Avoid periods of inactivity such as sitting longer than an hour when not asleep. This helps prevent blood  clots.  ?You may return to work once you are cleared by your surgeon.  ?Do not drive a car for 6 weeks or until released by your surgeon.  ?Do not drive while taking narcotics.  ?Wear elastic stockings for two weeks following surgery during the day but you may remove then at night.  ?Make sure you keep all of your appointments after your operation with all of your doctors and caregivers. You should call the office at the above phone number and make an appointment for approximately two weeks after the date of your surgery. ?Please pick up a stool softener and laxative for home use as long as you are requiring pain medications. ?ICE to the affected hip every three hours for 30 minutes at a time and then as needed for pain and swelling. Continue to use ice on the hip for pain and swelling from surgery. You may notice swelling that will progress down to the foot and ankle.  This is normal after surgery.  Elevate the leg when you are not up walking on it.   ?It is important for you to complete the blood thinner medication as prescribed by your doctor. ?Continue to use the breathing machine which will help keep your temperature down.  It is common for your temperature to cycle up and down following surgery, especially at night when you are not up moving around and exerting yourself.  The breathing machine keeps your lungs expanded and your temperature down. ? ?RANGE OF MOTION AND STRENGTHENING EXERCISES  ?These exercises are designed to help you   keep full movement of your hip joint. Follow your caregiver's or physical therapist's instructions. Perform all exercises about fifteen times, three times per day or as directed. Exercise both hips, even if you have had only one joint replacement. These exercises can be done on a training (exercise) mat, on the floor, on a table or on a bed. Use whatever works the best and is most comfortable for you. Use music or television while you are exercising so that the exercises are a  pleasant break in your day. This will make your life better with the exercises acting as a break in routine you can look forward to.  ?Lying on your back, slowly slide your foot toward your buttocks, raising your knee up off the floor. Then slowly slide your foot back down until your leg is straight again.  ?Lying on your back spread your legs as far apart as you can without causing discomfort.  ?Lying on your side, raise your upper leg and foot straight up from the floor as far as is comfortable. Slowly lower the leg and repeat.  ?Lying on your back, tighten up the muscle in the front of your thigh (quadriceps muscles). You can do this by keeping your leg straight and trying to raise your heel off the floor. This helps strengthen the largest muscle supporting your knee.  ?Lying on your back, tighten up the muscles of your buttocks both with the legs straight and with the knee bent at a comfortable angle while keeping your heel on the floor.  ? ?SKILLED REHAB INSTRUCTIONS: ?If the patient is transferred to a skilled rehab facility following release from the hospital, a list of the current medications will be sent to the facility for the patient to continue.  When discharged from the skilled rehab facility, please have the facility set up the patient's Home Health Physical Therapy prior to being released. Also, the skilled facility will be responsible for providing the patient with their medications at time of release from the facility to include their pain medication and their blood thinner medication. If the patient is still at the rehab facility at time of the two week follow up appointment, the skilled rehab facility will also need to assist the patient in arranging follow up appointment in our office and any transportation needs. ? ?POST-OPERATIVE OPIOID TAPER INSTRUCTIONS: ?It is important to wean off of your opioid medication as soon as possible. If you do not need pain medication after your surgery it is ok  to stop day one. ?Opioids include: ?Codeine, Hydrocodone(Norco, Vicodin), Oxycodone(Percocet, oxycontin) and hydromorphone amongst others.  ?Long term and even short term use of opiods can cause: ?Increased pain response ?Dependence ?Constipation ?Depression ?Respiratory depression ?And more.  ?Withdrawal symptoms can include ?Flu like symptoms ?Nausea, vomiting ?And more ?Techniques to manage these symptoms ?Hydrate well ?Eat regular healthy meals ?Stay active ?Use relaxation techniques(deep breathing, meditating, yoga) ?Do Not substitute Alcohol to help with tapering ?If you have been on opioids for less than two weeks and do not have pain than it is ok to stop all together.  ?Plan to wean off of opioids ?This plan should start within one week post op of your joint replacement. ?Maintain the same interval or time between taking each dose and first decrease the dose.  ?Cut the total daily intake of opioids by one tablet each day ?Next start to increase the time between doses. ?The last dose that should be eliminated is the evening dose.  ? ? ?MAKE   SURE YOU:  ?Understand these instructions.  ?Will watch your condition.  ?Will get help right away if you are not doing well or get worse. ? ?Pick up stool softner and laxative for home use following surgery while on pain medications. ?Do not remove your dressing. ?The dressing is waterproof--it is OK to take showers. ?Continue to use ice for pain and swelling after surgery. ?Do not use any lotions or creams on the incision until instructed by your surgeon. ?Total Hip Protocol. ? ?

## 2020-12-04 NOTE — Addendum Note (Signed)
Addendum  created 12/04/20 1442 by Cleda Daub, CRNA   LDA properties accepted

## 2020-12-04 NOTE — Progress Notes (Signed)
PROGRESS NOTE    Brandon [name]  DGU:[id number] DOB: 06-13-1940 DOA: 12/03/2020 PCP: Haydee Salter, MD    Chief Complaint  Patient presents with   Fall   Back Pain    Brief Narrative:  HPI per Dr. [name] is a 80 y.o. 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  He had cut off the lights last night around midnight when he tripped over the dog bed.  He denies any loss of consciousness or trauma to his head, but had severe pain out of his left hip.  He also hit his left shoulder which he states is difficult for him to raise his arm up due to pain.  He was unable to ambulate, bear weight, or move on the affected leg without being in extreme pain. Pain improve when he doesn't move the leg.  Patient denies having any chest pain, nausea, vomiting, abdominal pain, leg swelling, recent sick contacts.  He chronically has a cough with intermittent wheezing which he relates to his prior history of tonsillar squamous cell carcinoma treatment and difficulty clearing secretions.  Other associated symptoms included chronic pain for which he is on oxycodone followed by pain management. Notes that he is scheduled to have stenting of his abdominal aneurysm next month with vascular surgery.   En route with EMS patient was given fentanyl 100 mcg IV.   ED Course: Upon admission into the emergency department patient was seen to have a temperature of 99.8 F, respirations 16-27, blood pressures maintained, and O2 saturations noted to be as low as 85% with improvement on 2 L  nasal cannula oxygen.  Labs significant for WBC 11.1, hemoglobin 9.7, and high-sensitivity troponin 33.  CT of the chest abdomen pelvis noted a left femoral neck fracture with varus angulation.  Orthopedics was formally consulted and recommended transferring patient to [facility] on 12/04/2020  Assessment & Plan:   Principal Problem:   Closed displaced fracture of left femoral neck (HCC) Active Problems:   Coronary artery disease   Hypertension   Aneurysm of infrarenal abdominal aorta   Hypochromic anemia   Anxiety and depression   Chronic pain syndrome   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HCC)   Chronic obstructive pulmonary disease ([place])   Fall at home, initial encounter   Acute on chronic respiratory failure with hypoxia (HCC)   Elevated troponin  #1 displaced left femoral neck fracture secondary to mechanical fall -Noted to have cut out the lights the night prior to admission stating that he had tripped over the dog bed causing him to fall with continued severe left hip pain. -Imaging done on admission significant for displaced fracture of the left femur. -Patient seen in consultation by orthopedics, transferred to [facility] and underwent left total hip arthroplasty per orthopedics, Dr. Lyla Glassing this morning 12/04/2020. -Patient was seen for preop clearance by cardiology and patient noted to be high risk for surgery. -PT/OT. -Pain management and DVT prophylaxis per orthopedics.  2.  Leukocytosis -Likely reactive secondary to problem #1. -Patient afebrile. -CT chest negative for any acute infiltrates. -  Urinalysis unremarkable. -Leukocytosis has trended down. -No need for antibiotics.  3.  Acute respiratory failure with hypoxia/COPD -Patient noted to have O2 sats as low as 85% on room air in the ED. -CT chest done negative for PE, negative for any acute abnormalities. -Patient noted to have some mild wheezing on admission but felt likely to be more  upper airway. -Patient with no ongoing tobacco use however has a 60-pack-year history and felt hypoxia may have been iatrogenic as patient had received 100 MCG's of fentanyl on route with EMS and an additional 50 MCG's in the ED. -Place patient on scheduled duo nebs, Pulmicort. -Supportive care.  4.  Elevated troponin/CAD -Patient noted to have a elevated high-sensitivity troponin however remained asymptomatic. -Patient with complex/complicated cardiac history noted to have RCA with robust collaterals from LAD and 95% LAD stenosis status post PCI/arthrectomy on 04/27/2020. -EKG done with minimal ST depression in the lateral leads which was unchanged. -Elevated troponins likely secondary to demand ischemia. -2D echo obtained with a EF of 60 to 65% with grade 1 diastolic dysfunction on 37/02/6965. -Continue aspirin, Plavix. -Patient seen in consultation by cardiology who feel elevated troponin likely secondary to demand ischemia and severe CAD. -Per cardiology no further work-up needed at this time. -Continue home cardiac regimen of Coreg, Zetia, Lasix, Norvasc. -Continue to hold Imdur, lisinopril due to borderline blood pressure. -Cardiology following.  5.  Anemia -Baseline hemoglobin approximately 9-11. -No overt bleeding. -Check an anemia panel. -Follow H&H. -Transfusion threshold hemoglobin < 7.  6.  Left shoulder pain -Secondary to mechanical fall. -Imaging studies done with no acute fracture. -Per orthopedics.  7.  Hypertension -Continue Norvasc, Coreg, Lasix. -Imdur and lisinopril on hold as blood pressure somewhat borderline and could likely resume in the next 2 to 3 days if blood pressure becomes elevated.  8.  Polymyalgia rheumatica on chronic steroids -Continue home regimen prednisone.  9.  Chronic pain -Patient being followed in the outpatient setting by pain management. -Oxycodone as needed. -Further pain management due to acute fracture per orthopedics.  10.   Depression/anxiety -Celexa  11.  AAA -Patient with known 4.1 cm AAA for which she supposed to undergo endovascular stenting in December with vascular surgery. -Outpatient follow-up with vascular surgery.  12.  Hyperlipidemia -Continue Zetia, statin.  13.  Prior history of tobacco abuse -Per admitting physician patient noted to have had a 60-pack-year however no tobacco use since being diagnosed with cancer 3 years ago.  14.  GERD with history of esophageal stricture -PPI.   DVT prophylaxis: Per orthopedics. Code Status: Full Family Communication: Updated patient.  No family at bedside. Disposition:   Status is: Inpatient  Remains inpatient appropriate because: Ongoing inpatient care.  Unsafe disposition at this time.  Status post left total hip arthroplasty today 12/04/2020 needing further evaluation by PT/OT and orthopedics.  Not stable for discharge.       Consultants:  Cardiology: Dr. Humphrey Rolls 12/03/2020 Orthopedics: Dr. Mable Fill 12/03/2020  Procedures:  CT head 12/03/2020 CT chest abdomen and pelvis 12/03/2020 Plain films of the pelvis 12/04/2020 Left total hip arthroplasty per Dr. Lyla Glassing 12/04/2020  Antimicrobials:  None   Subjective: In PACU.  Denies any chest pain.  No shortness of breath.  No abdominal pain.  Objective: Vitals:   12/04/20 1320 12/04/20 1328 12/04/20 1330 12/04/20 1349  BP:   105/61 98/68  Pulse: 81 79 82 79  Resp: 18 14 16 16   Temp:    97.8 F (36.6 C)  TempSrc:  Oral  SpO2: 98% 91% 90% 90%  Weight:      Height:        Intake/Output Summary (Last 24 hours) at 12/04/2020 1535 Last data filed at 12/04/2020 1223 Gross per 24 hour  Intake 2919 ml  Output 1540 ml  Net 1379 ml   Filed Weights   12/03/20 1932 12/03/20 2038  Weight: 79.4 kg 81.9 kg    Examination:  General exam: Appears calm and comfortable.  Dry mucous membranes. Respiratory system: Clear to auscultation anterior lung fields.  No wheezes, no crackles, no  rhonchi.Marland Kitchen Respiratory effort normal. Cardiovascular system: S1 & S2 heard, RRR. No JVD, murmurs, rubs, gallops or clicks. No pedal edema. Gastrointestinal system: Abdomen is nondistended, soft and nontender. No organomegaly or masses felt. Normal bowel sounds heard. Central nervous system: Alert and oriented. No focal neurological deficits. Extremities: Left hip with dressing in place.  Skin: No rashes, lesions or ulcers Psychiatry: Judgement and insight appear normal. Mood & affect appropriate.     Data Reviewed: I have personally reviewed following labs and imaging studies  CBC: Recent Labs  Lab 12/03/20 0720 12/03/20 0801 12/04/20 0431 12/04/20 1038  WBC 11.1*  --  5.7  --   HGB 9.7* 10.5* 9.6* 10.5*  HCT 32.6* 31.0* 33.6* 31.0*  MCV 80.9  --  82.2  --   PLT 159  --  136*  --     Basic Metabolic Panel: Recent Labs  Lab 12/03/20 0720 12/03/20 0801 12/04/20 0431 12/04/20 1038  NA 135 135 138 137  K 3.6 3.6 3.8 4.5  CL 98 97* 100  --   CO2 29  --  29  --   GLUCOSE 129* 127* 95  --   BUN 17 17 17   --   CREATININE 0.90 0.80 0.78  --   CALCIUM 8.4*  --  8.5*  --     GFR: Estimated Creatinine Clearance: 77.3 mL/min (by C-G formula based on SCr of 0.78 mg/dL).  Liver Function Tests: Recent Labs  Lab 12/03/20 0720  AST 19  ALT 14  ALKPHOS 99  BILITOT 1.0  PROT 6.0*  ALBUMIN 3.1*    CBG: Recent Labs  Lab 12/04/20 1423  GLUCAP 178*     Recent Results (from the past 240 hour(s))  Resp Panel by RT-PCR (Flu A&B, Covid) Nasopharyngeal Swab     Status: None   Collection Time: 12/03/20  7:20 AM   Specimen: Nasopharyngeal Swab; Nasopharyngeal(NP) swabs in vial transport medium  Result Value Ref Range Status   SARS Coronavirus 2 by RT PCR NEGATIVE NEGATIVE Final    Comment: (NOTE) SARS-CoV-2 target nucleic acids are NOT DETECTED.  The SARS-CoV-2 RNA is generally detectable in upper respiratory specimens during the acute phase of infection. The  lowest concentration of SARS-CoV-2 viral copies this assay can detect is 138 copies/mL. A negative result does not preclude SARS-Cov-2 infection and should not be used as the sole basis for treatment or other patient management decisions. A negative result may occur with  improper specimen collection/handling, submission of specimen other than nasopharyngeal swab, presence of viral mutation(s) within the areas targeted by this assay, and inadequate number of viral copies(<138 copies/mL). A negative result must be combined with clinical observations, patient history, and epidemiological information. The expected result is Negative.  Fact Sheet for Patients:  EntrepreneurPulse.com.au  Fact Sheet for Healthcare Providers:  IncredibleEmployment.be  This test is no t yet approved or cleared by the [place] FDA and  has  been authorized for detection and/or diagnosis of SARS-CoV-2 by FDA under an Emergency Use Authorization (EUA). This EUA will remain  in effect (meaning this test can be used) for the duration of the COVID-19 declaration under Section 564(b)(1) of the Act, 21 U.S.C.section 360bbb-3(b)(1), unless the authorization is terminated  or revoked sooner.       Influenza A by PCR NEGATIVE NEGATIVE Final   Influenza B by PCR NEGATIVE NEGATIVE Final    Comment: (NOTE) The Xpert Xpress SARS-CoV-2/FLU/RSV plus assay is intended as an aid in the diagnosis of influenza from Nasopharyngeal swab specimens and should not be used as a sole basis for treatment. Nasal washings and aspirates are unacceptable for Xpert Xpress SARS-CoV-2/FLU/RSV testing.  Fact Sheet for Patients: EntrepreneurPulse.com.au  Fact Sheet for Healthcare Providers: IncredibleEmployment.be  This test is not yet approved or cleared by the [place] FDA and has been authorized for detection and/or diagnosis of SARS-CoV-2 by FDA under  an Emergency Use Authorization (EUA). This EUA will remain in effect (meaning this test can be used) for the duration of the COVID-19 declaration under Section 564(b)(1) of the Act, 21 U.S.C. section 360bbb-3(b)(1), unless the authorization is terminated or revoked.  Performed at [facility], [place] [address], [place], [place] [address]          Radiology Studies: DG Chest 1 View  Result Date: 12/03/2020 CLINICAL DATA:  Trauma, fall EXAM: CHEST  1 VIEW COMPARISON:  06/06/2020 FINDINGS: Transverse diameter of heart is slightly increased. There is metallic stent in the region of aortic valve. Thoracic aorta is tortuous and ectatic. There are no signs of pulmonary edema or focal pulmonary consolidation. There is new faint linear density in right parahilar region. There is no significant pleural effusion or pneumothorax. IMPRESSION: There are no signs of alveolar pulmonary edema. New faint linear density in the right upper lobe in right parahilar region may suggest subsegmental atelectasis or early pneumonitis. There is no pleural effusion or pneumothorax. Reading location: [place], [place]. Electronically Signed   By: Elmer Picker M.D.   On: 12/03/2020 08:31   CT HEAD WO CONTRAST  Result Date: 12/03/2020 CLINICAL DATA:  [place] last evening. EXAM: CT HEAD WITHOUT CONTRAST TECHNIQUE: Contiguous axial images were obtained from the base of the skull through the vertex without intravenous contrast. COMPARISON:  None. FINDINGS: Brain: Mild age related cerebral atrophy, ventriculomegaly and periventricular white matter disease. No extra-axial fluid collections are identified. No CT findings for acute hemispheric infarction or intracranial hemorrhage. No mass lesions. The brainstem and cerebellum are normal. Vascular: Moderate vascular calcifications but no aneurysm hyperdense vessels. Skull: No skull fracture or bone lesions. Sinuses/Orbits: Scattered sinus disease. The mastoid air cells  and middle ear cavities are clear. The globes are intact. Other: No scalp lesions or scalp hematoma. IMPRESSION: 1. Mild age related cerebral atrophy, ventriculomegaly and periventricular white matter disease. 2. No acute intracranial findings or skull fracture. Electronically Signed   By: Marijo Sanes M.D.   On: 12/03/2020 10:01   Pelvis Portable  Result Date: 12/04/2020 CLINICAL DATA:  LEFT hip replacement. EXAM: PORTABLE PELVIS 1-2 VIEWS COMPARISON:  Prior studies FINDINGS: LEFT total hip arthroplasty changes are noted without acute abnormality or complicating hardware features. No dislocation or acute fracture. Fusion hardware within the LOWER lumbar spine identified. IMPRESSION: LEFT total hip arthroplasty without complicating features. Electronically Signed   By: Margarette Canada M.D.   On: 12/04/2020 14:27   CT CHEST ABDOMEN PELVIS W CONTRAST  Result Date:  12/03/2020 CLINICAL DATA:  Fall, back pain.  Known aortic aneurysm EXAM: CT CHEST, ABDOMEN, AND PELVIS WITH CONTRAST TECHNIQUE: Multidetector CT imaging of the chest, abdomen and pelvis was performed following the standard protocol during bolus administration of intravenous contrast. CONTRAST:  122mL OMNIPAQUE IOHEXOL 350 MG/ML SOLN COMPARISON:  CT 11/24/2020 FINDINGS: CT CHEST FINDINGS Cardiovascular: Aortic valve replacement noted. No acute findings the heart, aorta, or great vessels. Mediastinum/Nodes: No axillary or supraclavicular adenopathy. No mediastinal or hilar adenopathy. No pericardial fluid. Esophagus normal. Lungs/Pleura: Mild atelectasis in the posterior RIGHT upper lobe. Pleural fluid. No pneumothorax. No airspace disease. Musculoskeletal: No rib fracture. No scapular fracture No fracture or dislocation. CT ABDOMEN AND PELVIS FINDINGS Hepatobiliary: No focal hepatic lesion. Postcholecystectomy. No biliary dilatation. Pancreas: Pancreas is normal. No ductal dilatation. No pancreatic inflammation. Spleen: Normal spleen Adrenals/urinary  tract: Adrenal glands and kidneys are normal. The ureters and bladder normal. Stomach/Bowel: Stomach, small bowel, appendix, and cecum are normal. Multiple diverticula of the descending colon and sigmoid colon without acute inflammation. Vascular/Lymphatic: Fusiform aneurysmal dilatation of the abdominal aorta to 4.1 cm. No change in short interval. No evidence of acute injury Reproductive: Prostatomegaly Other: No free fluid. Musculoskeletal: Fracture through the LEFT femoral neck with varus angulation. Interbody lumbar fusion L4-are after IMPRESSION: Chest Impression: 1. No acute findings in the thorax. 2. No evidence trauma. Abdomen / Pelvis Impression: 1. LEFT femoral neck fracture with varus angulation. 2. No acute findings in the peritoneal space. 3. No change in abdominal aortic aneurysm in short interval follow-up (8 days prior). See recommendation from CT 11/24/2020. Electronically Signed   By: Suzy Bouchard M.D.   On: 12/03/2020 10:21   DG Shoulder Left  Result Date: 12/03/2020 CLINICAL DATA:  Trauma, fall EXAM: LEFT SHOULDER - 2+ VIEW COMPARISON:  None. FINDINGS: No recent fracture or dislocation is seen. There is decreased distance between acromion and humeral head which may be due to positioning or suggest chronic rotator cuff tear. Osteopenia is seen in bony structures. Surgical clips are seen in left side of neck. IMPRESSION: No recent fracture or dislocation is seen. Reading location: [place], [place]. Electronically Signed   By: Elmer Picker M.D.   On: 12/03/2020 08:45   DG Knee Left Port  Result Date: 12/03/2020 CLINICAL DATA:  Left hip fracture. EXAM: PORTABLE LEFT KNEE - 1-2 VIEW COMPARISON:  None. FINDINGS: The knee joint demonstrates moderate degenerative changes but no acute fracture. No joint effusion. Advanced vascular calcifications are noted. IMPRESSION: Moderate degenerative changes but no acute bony findings or joint effusion. Electronically Signed   By: Marijo Sanes M.D.   On: 12/03/2020 18:45   DG C-Arm 1-60 Min-No Report  Result Date: 12/04/2020 Fluoroscopy was utilized by the requesting physician.  No radiographic interpretation.   DG C-Arm 1-60 Min-No Report  Result Date: 12/04/2020 Fluoroscopy was utilized by the requesting physician.  No radiographic interpretation.   DG HIP UNILAT WITH PELVIS 2-3 VIEWS LEFT  Result Date: 12/04/2020 CLINICAL DATA:  LEFT hip fracture. EXAM: DG HIP (WITH OR WITHOUT PELVIS) 2-3V LEFT COMPARISON:  Radiograph 12/03/2020 FINDINGS: Four spot intraoperative radiographs demonstrate LEFT hip total arthroplasty for repair of LEFT femoral neck fracture. IMPRESSION: LEFT hip arthroplasty without complication. Electronically Signed   By: Suzy Bouchard M.D.   On: 12/04/2020 11:11   DG Hip Unilat W or Wo Pelvis 2-3 Views Left  Result Date: 12/03/2020 CLINICAL DATA:  Trauma, fall EXAM: DG HIP (WITH OR WITHOUT PELVIS) 2-3V LEFT COMPARISON:  None. FINDINGS:  Comminuted fracture is seen in the neck of left femur. There is overriding of fracture fragments and varus deformity at the fracture site. Postsurgical and degenerative changes are noted in lower lumbar spine. Arterial calcifications are seen in soft tissues. IMPRESSION: Displaced fracture is seen in neck of left femur. Reading location: [place], [place]. Electronically Signed   By: Elmer Picker M.D.   On: 12/03/2020 08:34        Scheduled Meds:  allopurinol  200 mg Oral Daily   amLODipine  10 mg Oral Daily   aspirin  81 mg Oral BID WC   atorvastatin  80 mg Oral Daily   budesonide (PULMICORT) nebulizer solution  0.5 mg Nebulization BID   carvedilol  12.5 mg Oral BID   citalopram  20 mg Oral Daily   [START ON 12/05/2020] clopidogrel  75 mg Oral Daily   docusate sodium  100 mg Oral BID   ezetimibe  10 mg Oral Daily   furosemide  20 mg Oral Daily   gabapentin  300 mg Oral TID   guaiFENesin  1,200 mg Oral BID   ipratropium-albuterol  3 mL  Nebulization TID   mirtazapine  30 mg Oral QHS   pantoprazole  40 mg Oral Daily   potassium chloride  20 mEq Oral Daily   predniSONE  4 mg Oral Q breakfast   senna  1 tablet Oral BID   senna-docusate  1 tablet Oral QHS   tamsulosin  0.4 mg Oral QHS   Continuous Infusions:  sodium chloride 100 mL/hr at 12/04/20 1437    ceFAZolin (ANCEF) IV     methocarbamol (ROBAXIN) IV Stopped (12/04/20 1356)   tranexamic acid       LOS: 1 day    Time spent: 40 mins    Irine Seal, MD [facility]   To contact the attending provider between 7A-7P or the covering provider during after hours 7P-7A, please log into the web site [email or web address] and access using universal [place] password for that web site. If you do not have the password, please call the hospital operator.  12/04/2020, 3:35 PM

## 2020-12-04 NOTE — Consult Note (Signed)
ORTHOPAEDIC CONSULTATION  REQUESTING PHYSICIAN: Eugenie Filler, MD  PCP:  Haydee Salter, MD  Chief Complaint: Displaced left femoral neck fracture  HPI: Brandon [name] is a 80 y.o. 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  He tripped over his dog bed and landed on his left hip.  He also injured his shoulder, and x-rays were negative.  X-rays of the left hip revealed a displaced left femoral neck fracture.  Orthopedic consultation was placed for management of the fracture.  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  He takes aspirin and Plavix.  After he was admitted, his troponins elevated significantly.  He was seen by cardiology, and this was thought to be due to demand ischemia.  He had a recent echocardiogram with 60 to 65% EF.  He was deemed high risk for surgery, but further procedures were not recommended.  I was asked to take over care by my colleague, Dr. Georgeanna Harrison.  Past Medical History:  Diagnosis Date   Abdominal aneurysm    Arthritis    "all over" (07/19/2016)   BPH (benign prostatic hypertrophy)    CAD (coronary artery disease)    Chicken pox    Chronic lower back pain    s/p surgical fusion   Depression    Diverticulosis    Esophageal stricture    GERD (gastroesophageal reflux disease)    Hepatitis B 1984   Hiatal hernia    History of radiation therapy 01/16/18- 03/05/18   Left Tonsil, 66 Gy in 33 fractions to high risk nodal  echelons.    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 Burt Knack and Dr Cyndia Bent    Severe aortic stenosis    TIA (transient ischemic attack) 1990s   hx   tonsillar ca dx'd 11/2017   Tubular adenoma of colon 2009   Past Surgical History:  Procedure Laterality Date   BACK SURGERY     CARDIAC CATHETERIZATION     CATARACT EXTRACTION W/ INTRAOCULAR LENS  IMPLANT, BILATERAL Bilateral 01/2012 - 02/2012   CORONARY ATHERECTOMY N/A 04/27/2020   Procedure: CORONARY ATHERECTOMY;  Surgeon: Sherren Mocha, MD;  Location: [facility];  Service: Cardiovascular;  Laterality: N/A;   CORONARY STENT INTERVENTION N/A 04/27/2020   Procedure: CORONARY STENT INTERVENTION;  Surgeon: Sherren Mocha, MD;  Location: [facility];  Service: Cardiovascular;  Laterality: N/A;   DIRECT LARYNGOSCOPY Left 10/09/2017   Procedure: DIRECT LARYNGOSCOPY WITH BOPSY;  Surgeon: Jodi Marble, MD;  Location: [facility];  Service: ENT;  Laterality: Left;   ESOPHAGOGASTRODUODENOSCOPY (EGD) WITH ESOPHAGEAL DILATION     "couple times" (07/19/2016)   ESOPHAGOSCOPY Left 10/09/2017   Procedure: ESOPHAGOSCOPY;  Surgeon: Jodi Marble, MD;  Location: [facility];  Service: ENT;  Laterality: Left;   INTRAVASCULAR IMAGING/OCT N/A 04/27/2020  Procedure: INTRAVASCULAR IMAGING/OCT;  Surgeon: Sherren Mocha, MD;  Location: [facility];  Service: Cardiovascular;  Laterality: N/A;   INTRAVASCULAR PRESSURE WIRE/FFR STUDY N/A 04/27/2020   Procedure: INTRAVASCULAR PRESSURE WIRE/FFR STUDY;  Surgeon: Sherren Mocha, MD;  Location: [facility];  Service: Cardiovascular;  Laterality: N/A;   IR GASTROSTOMY TUBE MOD SED  01/08/2018   IR THORACENTESIS ASP PLEURAL SPACE W/IMG GUIDE  07/19/2016   LAPAROSCOPIC CHOLECYSTECTOMY   1994   LUMBAR [place] SURGERY  05/1996   L4-5; Dr. Claudean Kinds LAMINECTOMY/DECOMPRESSION MICRODISCECTOMY  10/2002   L3-4. Dr. Sherwood Gambler   MULTIPLE TOOTH EXTRACTIONS  1980s   PARTIAL GLOSSECTOMY  12/02/2017   Dr. [facility]ico[facility]- [facility]   pharyngoplasty for closure of tingue base defect  12/02/2017   Dr. Nicolette Bang- [place]  07/15/2016   POSTERIOR LUMBAR FUSION  09/1996   Ray cage, L4-5 Dr. Rita Ohara   PROSTATE BIOPSY  ~ 2017   radical tonsillectomy Left 12/02/2017   Dr. Nicolette Bang at [facility] N/A 03/31/2020   Procedure: RIGHT HEART CATH AND CORONARY ANGIOGRAPHY;  Surgeon: Larey Dresser, MD;  Location: [facility];  Service: Cardiovascular;  Laterality: N/A;   RIGID BRONCHOSCOPY Left 10/09/2017   Procedure: RIGID BRONCHOSCOPY;  Surgeon: Jodi Marble, MD;  Location: [facility];  Service: ENT;  Laterality: Left;   TEE WITHOUT CARDIOVERSION N/A 06/07/2020   Procedure: TRANSESOPHAGEAL ECHOCARDIOGRAM (TEE);  Surgeon: Sherren Mocha, MD;  Location: [facility];  Service: Open Heart Surgery;  Laterality: N/A;   TONSILLECTOMY     TRACHEOSTOMY  12/02/2017   Dr. Nicolette Bang- [facility]   ULTRASOUND GUIDANCE FOR VASCULAR ACCESS Right 06/07/2020   Procedure: ULTRASOUND GUIDANCE FOR VASCULAR ACCESS;  Surgeon: Sherren Mocha, MD;  Location: [facility];  Service: Open Heart Surgery;  Laterality: Right;   VASCULAR SURGERY     Social History   Socioeconomic History   Marital status: Married    Spouse name: etta   Number of children: 2   Years of education: Not on file   Highest education level: Not on file  Occupational History   Occupation: retired    Fish farm manager: RETIRED    Comment: disabled due to back problems  Tobacco Use   Smoking status: Former    Packs/day: 1.50    Years: 50.00    Pack years: 75.00    Types: Cigarettes    Quit date: 09/05/2017    Years since quitting: 3.2   Smokeless tobacco: Never   Tobacco comments:    smoked  less than 1 ppd for 40+ years;   Vaping Use   Vaping Use: Never used  Substance and Sexual Activity   Alcohol use: Yes    Alcohol/week: 0.0 standard drinks    Comment: Occasionally,not a heavy drinker    Drug use: Yes    Types: Oxycodone   Sexual activity: Never  Other Topics Concern   Not on file  Social History Narrative   ** Merged History Encounter **       Married (3rd), [place]. 2 children from 1st marriage, 4 step children.    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       Cell # [phone]   Social Determinants of Health   Financial Resource Strain: Low Risk    Difficulty of Paying Living Expenses: Not hard at  all  Food Insecurity: No Food Insecurity   Worried About Charity fundraiser in the Last Year: Never true   Ran Out of Food in the Last Year: Never true  Transportation Needs: Not on file  Physical Activity: Inactive   Days of Exercise per Week: 0 days   Minutes of Exercise per Session: 0 min  Stress: No Stress Concern Present   Feeling of Stress : Not at all  Social Connections: Moderately Isolated   Frequency of Communication with Friends and Family: More than three times a week   Frequency of Social Gatherings with Friends and Family: Once a week   Attends Religious Services: Never   Marine scientist or Organizations: No   Attends Music therapist: Never   Marital Status: Married   Family History  Problem Relation Age of Onset   Heart disease Father 71       Living   Coronary artery disease Father        CABG   Alzheimer's disease Mother 9       Deceased   Arthritis Mother    Aneurysm Brother    Stomach cancer Maternal Uncle    Brain cancer Maternal Aunt        x2   Obesity Daughter        Had Bypass Sx   Allergies  Allergen Reactions   Celebrex [Celecoxib] Hives and Itching   Tape     Other reaction(s): Other (See Comments) Tears skin.  Prefers paper tape   Prior to Admission medications   Medication Sig Start Date End Date Taking? Authorizing Provider  allopurinol (ZYLOPRIM) 100 MG tablet Take 2 tablets (200 mg total) by mouth daily. 09/12/20  Yes Ofilia Neas, PA-C  amLODipine (NORVASC) 10 MG tablet TAKE 1 TABLET(10 MG) BY MOUTH DAILY Patient taking differently: Take 10 mg by mouth daily. 07/01/20  Yes Larey Dresser, MD  aspirin 81 MG EC tablet Take 81 mg by mouth at bedtime.   Yes [provider]  atorvastatin (LIPITOR) 80 MG tablet Take 1 tablet (80 mg total) by mouth daily. 04/01/20  Yes Larey Dresser, MD  carvedilol (COREG) 12.5 MG tablet Take 1 tablet (12.5 mg total) by mouth 2 (two) times daily. 08/23/20 08/23/21 Yes Larey Dresser, MD  citalopram (CELEXA) 20 MG tablet Take 1 tablet (20 mg total) by mouth daily. 04/25/20  Yes Lovorn, Jinny Blossom, MD  clopidogrel (PLAVIX) 75 MG tablet Take 75 mg by mouth daily.   Yes [provider]  ezetimibe (ZETIA) 10 MG tablet Take 1 tablet (10 mg total) by mouth daily. 05/23/20 05/23/21 Yes Larey Dresser, MD  fluticasone (FLONASE) 50 MCG/ACT nasal spray SHAKE LIQUID AND USE 2 SPRAYS IN EACH NOSTRIL DAILY AS NEEDED FOR RHINITIS OR ALLERGIES Patient taking differently: Place 2 sprays into both nostrils daily as needed for allergies. 11/14/20  Yes Haydee Salter, MD  furosemide (LASIX) 20 MG tablet Take 1 tablet (20 mg total) by mouth daily. 11/25/20 02/23/21 Yes Larey Dresser, MD  gabapentin (NEURONTIN) 300 MG capsule TAKE 1 CAPSULE(300 MG) BY MOUTH THREE TIMES DAILY Patient taking differently: Take 300 mg by mouth 3 (three) times daily. TAKE 1 CAPSULE(300 MG) BY MOUTH THREE TIMES DAILY 10/07/20  Yes Lovorn, Jinny Blossom, MD  isosorbide mononitrate (IMDUR) 30 MG 24 hr tablet TAKE 1 TABLET(30 MG) BY MOUTH AT BEDTIME Patient taking differently: Take 30 mg by mouth daily. 01/18/20  Yes Brunetta Jeans, PA-C  lisinopril (ZESTRIL) 20 MG tablet TAKE 1 TABLET(10 MG) BY MOUTH DAILY  11/25/20  Yes Larey Dresser, MD  mirtazapine (REMERON) 30 MG tablet TAKE 1 TABLET BY MOUTH EVERY NIGHT AT BEDTIME Patient taking differently: Take 30 mg by mouth at bedtime. 11/01/20  Yes Lovorn, Jinny Blossom, MD  oxyCODONE (OXY IR/ROXICODONE) 5 MG immediate release tablet Take 1-2 tablets (5-10 mg total) by mouth every 6 (six) hours as needed for severe pain (to take 2 tabs ONLY when severe pain- which is not usual for pt). 11/21/20  Yes Lovorn, Jinny Blossom, MD  pantoprazole (PROTONIX) 40 MG tablet Take 1 tablet (40 mg total) by mouth daily. 04/20/20 04/15/21 Yes Sherren Mocha, MD  potassium chloride (KLOR-CON) 10 MEQ tablet Take 2 tablets (20 mEq total) by mouth daily. 11/25/20 02/23/21 Yes Larey Dresser, MD  predniSONE (DELTASONE) 1 MG tablet Take 4 tablets (4 mg total) by mouth daily with breakfast. 09/19/20  Yes Deveshwar, Abel Presto, MD  tamsulosin (FLOMAX) 0.4 MG CAPS capsule Take 0.4 mg by mouth at bedtime. 12/23/18  Yes [provider]  traMADol (ULTRAM) 50 MG tablet TAKE 2 TABLETS(100 MG) BY MOUTH TWICE DAILY Patient taking differently: Take 100 mg by mouth 2 (two) times daily as needed for moderate pain. TAKE 2 TABLETS(100 MG) BY MOUTH TWICE DAILY 11/21/20  Yes Lovorn, Jinny Blossom, MD   DG Chest 1 View  Result Date: 12/03/2020 CLINICAL DATA:  Trauma, fall EXAM: CHEST  1 VIEW COMPARISON:  06/06/2020 FINDINGS: Transverse diameter of heart is slightly increased. There is metallic stent in the region of aortic valve. Thoracic aorta is tortuous and ectatic. There are no signs of pulmonary edema or focal pulmonary consolidation. There is new faint linear density in right parahilar region. There is no significant pleural effusion or pneumothorax. IMPRESSION: There are no signs of alveolar pulmonary edema. New faint linear density in the right upper lobe in right parahilar region may suggest subsegmental atelectasis or early pneumonitis. There is no pleural effusion or pneumothorax. Reading location: [place], [place]. Electronically Signed   By: Elmer Picker M.D.   On: 12/03/2020 08:31   CT HEAD WO CONTRAST  Result Date: 12/03/2020 CLINICAL DATA:  [place] last evening. EXAM: CT HEAD WITHOUT CONTRAST TECHNIQUE: Contiguous axial images were obtained from the base of the skull through the vertex without intravenous contrast. COMPARISON:  None. FINDINGS: Brain: Mild age related cerebral atrophy, ventriculomegaly and periventricular white matter disease. No extra-axial fluid collections are identified. No CT findings for acute hemispheric infarction or intracranial hemorrhage. No mass lesions. The brainstem and cerebellum are normal. Vascular: Moderate vascular calcifications but no aneurysm hyperdense vessels. Skull: No skull fracture or bone lesions. Sinuses/Orbits: Scattered sinus disease. The mastoid air cells and middle ear cavities are clear. The globes are intact. Other: No scalp lesions or scalp hematoma. IMPRESSION: 1. Mild age related cerebral atrophy, ventriculomegaly and periventricular white matter disease. 2. No acute intracranial findings or skull fracture. Electronically Signed   By: Marijo Sanes M.D.   On: 12/03/2020 10:01   CT CHEST ABDOMEN PELVIS W CONTRAST  Result Date: 12/03/2020 CLINICAL DATA:  Fall, back pain.  Known aortic aneurysm EXAM: CT CHEST, ABDOMEN, AND PELVIS WITH CONTRAST TECHNIQUE: Multidetector CT imaging of the chest, abdomen and pelvis was performed following the standard protocol during bolus administration of intravenous contrast. CONTRAST:  152mL OMNIPAQUE IOHEXOL 350 MG/ML SOLN COMPARISON:  CT 11/24/2020 FINDINGS: CT CHEST FINDINGS Cardiovascular: Aortic valve replacement noted. No acute findings the heart, aorta, or great vessels. Mediastinum/Nodes:  No axillary or supraclavicular adenopathy. No mediastinal or hilar adenopathy. No pericardial fluid. Esophagus normal. Lungs/Pleura: Mild atelectasis in the posterior RIGHT upper lobe. Pleural fluid. No pneumothorax. No  airspace disease. Musculoskeletal: No rib fracture. No scapular fracture No fracture or dislocation. CT ABDOMEN AND PELVIS FINDINGS Hepatobiliary: No focal hepatic lesion. Postcholecystectomy. No biliary dilatation. Pancreas: Pancreas is normal. No ductal dilatation. No pancreatic inflammation. Spleen: Normal spleen Adrenals/urinary tract: Adrenal glands and kidneys are normal. The ureters and bladder normal. Stomach/Bowel: Stomach, small bowel, appendix, and cecum are normal. Multiple diverticula of the descending colon and sigmoid colon without acute inflammation. Vascular/Lymphatic: Fusiform aneurysmal dilatation of the abdominal aorta to 4.1 cm. No change in short interval. No evidence of acute injury Reproductive: Prostatomegaly Other: No free fluid. Musculoskeletal: Fracture through the LEFT femoral neck with varus angulation. Interbody lumbar fusion L4-are after IMPRESSION: Chest Impression: 1. No acute findings in the thorax. 2. No evidence trauma. Abdomen / Pelvis Impression: 1. LEFT femoral neck fracture with varus angulation. 2. No acute findings in the peritoneal space. 3. No change in abdominal aortic aneurysm in short interval follow-up (8 days prior). See recommendation from CT 11/24/2020. Electronically Signed   By: Suzy Bouchard M.D.   On: 12/03/2020 10:21   DG Shoulder Left  Result Date: 12/03/2020 CLINICAL DATA:  Trauma, fall EXAM: LEFT SHOULDER - 2+ VIEW COMPARISON:  None. FINDINGS: No recent fracture or dislocation is seen. There is decreased distance between acromion and humeral head which may be due to positioning or suggest chronic rotator cuff tear. Osteopenia is seen in bony structures. Surgical clips are seen in left side of neck. IMPRESSION: No recent fracture or dislocation is seen. Reading location: [place], [place]. Electronically Signed   By: Elmer Picker M.D.   On: 12/03/2020 08:45   DG Knee Left Port  Result Date: 12/03/2020 CLINICAL DATA:  Left hip fracture.  EXAM: PORTABLE LEFT KNEE - 1-2 VIEW COMPARISON:  None. FINDINGS: The knee joint demonstrates moderate degenerative changes but no acute fracture. No joint effusion. Advanced vascular calcifications are noted. IMPRESSION: Moderate degenerative changes but no acute bony findings or joint effusion. Electronically Signed   By: Marijo Sanes M.D.   On: 12/03/2020 18:45   DG Hip Unilat W or Wo Pelvis 2-3 Views Left  Result Date: 12/03/2020 CLINICAL DATA:  Trauma, fall EXAM: DG HIP (WITH OR WITHOUT PELVIS) 2-3V LEFT COMPARISON:  None. FINDINGS: Comminuted fracture is seen in the neck of left femur. There is overriding of fracture fragments and varus deformity at the fracture site. Postsurgical and degenerative changes are noted in lower lumbar spine. Arterial calcifications are seen in soft tissues. IMPRESSION: Displaced fracture is seen in neck of left femur. Reading location: [place], [place]. Electronically Signed   By: Elmer Picker M.D.   On: 12/03/2020 08:34    Positive ROS: All other systems have been reviewed and were otherwise negative with the exception of those mentioned in the HPI and as above.  Physical Exam: General: Alert, no acute distress Cardiovascular: No pedal edema Respiratory: No cyanosis, no use of accessory musculature GI: No organomegaly, abdomen is soft and non-tender Skin: No lesions in the area of chief complaint Neurologic: Sensation intact distally Psychiatric: Patient is competent for consent with normal mood and affect Lymphatic: No axillary or cervical lymphadenopathy  MUSCULOSKELETAL: Examination of the left hip reveals no skin wounds or lesions.  He is shortened and externally rotated.  He has pain with attempted logrolling of the hip.  Palpable  pedal pulses.  No focal motor or sensory deficit.  Assessment: Displaced left femoral neck fracture. Left shoulder pain, negative x-ray. Significant cardiac history.  Plan: I discussed the findings with the  patient.  He has a displaced left femoral neck fracture that requires surgical treatment for pain control and immediate mobilization out of bed.  We discussed the risk, benefits, and alternatives to left total hip replacement.  Please see statement of risk.  Patient understands that he is at high risk for surgery from a cardiac standpoint, however delaying the surgery is not recommended due to the risk of prolonged immobility.  We will plan for surgery today.  After surgery, we will plan to do a more thorough shoulder exam.  If he does not have improved motor function, then we will get an MRI of the left shoulder to evaluate for rotator cuff tear.  All questions were solicited and answered.  The risks, benefits, and alternatives were discussed with the patient. There are risks associated with the surgery including, but not limited to, problems with anesthesia (death), infection, instability (giving out of the joint), dislocation, differences in leg length/angulation/rotation, fracture of bones, loosening or failure of implants, hematoma (blood accumulation) which may require surgical drainage, blood clots, pulmonary embolism, nerve injury (foot drop and lateral thigh numbness), and blood vessel injury. The patient understands these risks and elects to proceed.   Bertram Savin, MD [phone]    12/04/2020 7:42 AM

## 2020-12-05 ENCOUNTER — Telehealth: Payer: Self-pay | Admitting: Physical Medicine and Rehabilitation

## 2020-12-05 ENCOUNTER — Ambulatory Visit: Payer: Medicare Other | Admitting: Rheumatology

## 2020-12-05 DIAGNOSIS — Z952 Presence of prosthetic heart valve: Secondary | ICD-10-CM | POA: Diagnosis not present

## 2020-12-05 DIAGNOSIS — I2510 Atherosclerotic heart disease of native coronary artery without angina pectoris: Secondary | ICD-10-CM

## 2020-12-05 DIAGNOSIS — C090 Malignant neoplasm of tonsillar fossa: Secondary | ICD-10-CM

## 2020-12-05 DIAGNOSIS — F32A Depression, unspecified: Secondary | ICD-10-CM

## 2020-12-05 DIAGNOSIS — G894 Chronic pain syndrome: Secondary | ICD-10-CM

## 2020-12-05 DIAGNOSIS — M19042 Primary osteoarthritis, left hand: Secondary | ICD-10-CM

## 2020-12-05 DIAGNOSIS — M19041 Primary osteoarthritis, right hand: Secondary | ICD-10-CM

## 2020-12-05 DIAGNOSIS — I7143 Infrarenal abdominal aortic aneurysm, without rupture: Secondary | ICD-10-CM

## 2020-12-05 DIAGNOSIS — Z8719 Personal history of other diseases of the digestive system: Secondary | ICD-10-CM

## 2020-12-05 DIAGNOSIS — M5136 Other intervertebral disc degeneration, lumbar region: Secondary | ICD-10-CM

## 2020-12-05 DIAGNOSIS — E538 Deficiency of other specified B group vitamins: Secondary | ICD-10-CM

## 2020-12-05 DIAGNOSIS — I2489 Other forms of acute ischemic heart disease: Secondary | ICD-10-CM

## 2020-12-05 DIAGNOSIS — R1312 Dysphagia, oropharyngeal phase: Secondary | ICD-10-CM

## 2020-12-05 DIAGNOSIS — Z79899 Other long term (current) drug therapy: Secondary | ICD-10-CM

## 2020-12-05 DIAGNOSIS — K750 Abscess of liver: Secondary | ICD-10-CM

## 2020-12-05 DIAGNOSIS — I248 Other forms of acute ischemic heart disease: Secondary | ICD-10-CM | POA: Diagnosis not present

## 2020-12-05 DIAGNOSIS — Z720 Tobacco use: Secondary | ICD-10-CM

## 2020-12-05 DIAGNOSIS — Z9889 Other specified postprocedural states: Secondary | ICD-10-CM

## 2020-12-05 DIAGNOSIS — M353 Polymyalgia rheumatica: Secondary | ICD-10-CM

## 2020-12-05 DIAGNOSIS — Z8639 Personal history of other endocrine, nutritional and metabolic disease: Secondary | ICD-10-CM

## 2020-12-05 DIAGNOSIS — I10 Essential (primary) hypertension: Secondary | ICD-10-CM

## 2020-12-05 DIAGNOSIS — Z7952 Long term (current) use of systemic steroids: Secondary | ICD-10-CM

## 2020-12-05 DIAGNOSIS — M1A0210 Idiopathic chronic gout, right elbow, without tophus (tophi): Secondary | ICD-10-CM

## 2020-12-05 DIAGNOSIS — L409 Psoriasis, unspecified: Secondary | ICD-10-CM

## 2020-12-05 LAB — BASIC METABOLIC PANEL
Anion gap: 6 (ref 5–15)
BUN: 21 mg/dL (ref 8–23)
CO2: 28 mmol/L (ref 22–32)
Calcium: 8.2 mg/dL — ABNORMAL LOW (ref 8.9–10.3)
Chloride: 102 mmol/L (ref 98–111)
Creatinine, Ser: 0.76 mg/dL (ref 0.61–1.24)
GFR, Estimated: >60 mL/min (ref >60)
Glucose, Bld: 166 mg/dL — ABNORMAL HIGH (ref 70–99)
Potassium: 3.8 mmol/L (ref 3.5–5.1)
Sodium: 136 mmol/L (ref 135–145)

## 2020-12-05 LAB — CBC
HCT: 27.8 % — ABNORMAL LOW (ref 39.0–52.0)
Hemoglobin: 8.6 g/dL — ABNORMAL LOW (ref 13.0–17.0)
MCH: 24.9 pg — ABNORMAL LOW (ref 26.0–34.0)
MCHC: 30.9 g/dL (ref 30.0–36.0)
MCV: 80.3 fL (ref 80.0–100.0)
Platelets: 122 10*3/uL — ABNORMAL LOW (ref 150–400)
RBC: 3.46 MIL/uL — ABNORMAL LOW (ref 4.22–5.81)
RDW: 15.5 % (ref 11.5–15.5)
WBC: 8 10*3/uL (ref 4.0–10.5)
nRBC: 0 % (ref 0.0–0.2)

## 2020-12-05 LAB — ECHOCARDIOGRAM COMPLETE
AR max vel: 1.64 cm2
AV Area VTI: 1.89 cm2
AV Area mean vel: 1.47 cm2
AV Mean grad: 17.5 mmHg
AV Peak grad: 26.2 mmHg
Ao pk vel: 2.56 m/s
Area-P 1/2: 2.69 cm2
Height: 66.5 in
S' Lateral: 2.2 cm
Weight: 2888.91 oz

## 2020-12-05 LAB — HEMOGLOBIN AND HEMATOCRIT, BLOOD
HCT: 26.2 % — ABNORMAL LOW (ref 39.0–52.0)
Hemoglobin: 7.9 g/dL — ABNORMAL LOW (ref 13.0–17.0)

## 2020-12-05 LAB — MAGNESIUM: Magnesium: 1.7 mg/dL (ref 1.7–2.4)

## 2020-12-05 MED ORDER — CYANOCOBALAMIN 1000 MCG/ML IJ SOLN
1000.0000 ug | Freq: Every day | INTRAMUSCULAR | Status: DC
  Administered 2020-12-05 – 2020-12-08 (×4): 1000 ug via SUBCUTANEOUS
  Filled 2020-12-05 (×4): qty 1

## 2020-12-05 MED ORDER — MAGNESIUM SULFATE 4 GM/100ML IV SOLN
4.0000 g | Freq: Once | INTRAVENOUS | Status: AC
  Administered 2020-12-05: 4 g via INTRAVENOUS
  Filled 2020-12-05: qty 100

## 2020-12-05 MED ORDER — LISINOPRIL 20 MG PO TABS
20.0000 mg | ORAL_TABLET | Freq: Every day | ORAL | Status: DC
  Administered 2020-12-05 – 2020-12-06 (×2): 20 mg via ORAL
  Filled 2020-12-05 (×2): qty 1

## 2020-12-05 MED ORDER — ASPIRIN 81 MG PO CHEW
81.0000 mg | CHEWABLE_TABLET | Freq: Every day | ORAL | Status: DC
  Administered 2020-12-06 – 2020-12-08 (×3): 81 mg via ORAL
  Filled 2020-12-05 (×3): qty 1

## 2020-12-05 MED ORDER — ASPIRIN 81 MG PO CHEW: 81.0000 mg | CHEWABLE_TABLET | Freq: Two times a day (BID) | ORAL | 0 refills | Status: AC

## 2020-12-05 MED ORDER — ISOSORBIDE MONONITRATE ER 30 MG PO TB24
30.0000 mg | ORAL_TABLET | Freq: Every day | ORAL | Status: DC
  Administered 2020-12-05 – 2020-12-08 (×4): 30 mg via ORAL
  Filled 2020-12-05 (×4): qty 1

## 2020-12-05 MED ORDER — ADULT MULTIVITAMIN W/MINERALS CH
1.0000 | ORAL_TABLET | Freq: Every day | ORAL | Status: DC
  Administered 2020-12-05 – 2020-12-08 (×4): 1 via ORAL
  Filled 2020-12-05 (×4): qty 1

## 2020-12-05 MED ORDER — OXYCODONE HCL 10 MG PO TABS: 10.0000 mg | ORAL_TABLET | ORAL | 0 refills | Status: DC | PRN

## 2020-12-05 MED ORDER — IPRATROPIUM-ALBUTEROL 0.5-2.5 (3) MG/3ML IN SOLN
3.0000 mL | Freq: Two times a day (BID) | RESPIRATORY_TRACT | Status: DC
  Administered 2020-12-05 – 2020-12-08 (×6): 3 mL via RESPIRATORY_TRACT
  Filled 2020-12-05 (×7): qty 3

## 2020-12-05 NOTE — Telephone Encounter (Signed)
Patient called stating he had a fall and was in the hospital.  He has cancelled his apt for next week 12/16/20 and will call once he has been discharged regarding pain medication.

## 2020-12-05 NOTE — Progress Notes (Signed)
PROGRESS NOTE    Brandon [name]  UJW:[id number] DOB: [birth year] DOA: 12/03/2020 PCP: Haydee Salter, MD    Chief Complaint  Patient presents with   Fall   Back Pain    Brief Narrative:  HPI per Dr. Dicky [name] is a 80 y.o. 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  He had cut off the lights last night around midnight when he tripped over the dog bed.  He denies any loss of consciousness or trauma to his head, but had severe pain out of his left hip.  He also hit his left shoulder which he states is difficult for him to raise his arm up due to pain.  He was unable to ambulate, bear weight, or move on the affected leg without being in extreme pain. Pain improve when he doesn't move the leg.  Patient denies having any chest pain, nausea, vomiting, abdominal pain, leg swelling, recent sick contacts.  He chronically has a cough with intermittent wheezing which he relates to his prior history of tonsillar squamous cell carcinoma treatment and difficulty clearing secretions.  Other associated symptoms included chronic pain for which he is on oxycodone followed by pain management. Notes that he is scheduled to have stenting of his abdominal aneurysm next month with vascular surgery.   En route with EMS patient was given fentanyl 100 mcg IV.   ED Course: Upon admission into the emergency department patient was seen to have a temperature of 99.8 F, respirations 16-27, blood pressures maintained, and O2 saturations noted to be as low as 85% with improvement on 2 L  nasal cannula oxygen.  Labs significant for WBC 11.1, hemoglobin 9.7, and high-sensitivity troponin 33.  CT of the chest abdomen pelvis noted a left femoral neck fracture with varus angulation.  Orthopedics was formally consulted and recommended transferring patient to [place] of surgery on 12/04/2020  Assessment & Plan:   Principal Problem:   Closed displaced fracture of left femoral neck (HCC) Active Problems:   Coronary artery disease   Hypertension   Aneurysm of infrarenal abdominal aorta   Hypochromic anemia   Anxiety and depression   Chronic pain syndrome   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HCC)   Chronic obstructive pulmonary disease ([place])   Fall at home, initial encounter   Acute on chronic respiratory failure with hypoxia (HCC)   Elevated troponin  #1 displaced left femoral neck fracture secondary to mechanical fall -Noted to have cut out the lights the night prior to admission stating that he had tripped over the dog bed causing him to fall with continued severe left hip pain. -Imaging done on admission significant for displaced fracture of the left femur. -Patient seen in consultation by orthopedics, transferred to [facility] long hospital and underwent left total hip arthroplasty per orthopedics, Dr. Lyla Glassing this morning 12/04/2020. -Patient was seen for preop clearance by cardiology and patient noted to be high risk for surgery. -PT/OT. -Pain management and DVT prophylaxis per orthopedics.  2.  Leukocytosis -Likely reactive secondary to problem #1. -Patient afebrile. -CT chest negative for any acute infiltrates. -  Urinalysis unremarkable. -Leukocytosis has trended down. -No need for antibiotics.  3.  Acute respiratory failure with hypoxia/COPD -Patient noted to have O2 sats as low as 85% on room air in the ED. -CT chest done negative for PE, negative for any acute abnormalities. -Patient noted to have some mild wheezing on admission but felt likely to be more  upper airway. -Patient with no ongoing tobacco use however has a 60-pack-year history and felt hypoxia may have been iatrogenic as patient had received 100 MCG's of fentanyl on route with EMS and an additional 50 MCG's in the ED. -Patient currently 100% on 2 L nasal cannula. -Continue Pulmicort, scheduled duo nebs. -Supportive care.  4.  Elevated troponin/CAD -Patient noted to have a elevated high-sensitivity troponin however remained asymptomatic. -Patient with complex/complicated cardiac history noted to have RCA with robust collaterals from LAD and 95% LAD stenosis status post PCI/arthrectomy on 04/27/2020. -EKG done with minimal ST depression in the lateral leads which was unchanged. -Elevated troponins likely secondary to demand ischemia. -2D echo obtained with a EF of 60 to 65% with grade 1 diastolic dysfunction on 16/04/8464. -Continue aspirin, Plavix. -Patient seen in consultation by cardiology who feel elevated troponin likely secondary to demand ischemia and severe CAD. -Per cardiology no further work-up needed at this time. -Continue home cardiac regimen of Coreg, Zetia, Lasix, Norvasc. -We will resume Imdur and lisinopril per cardiology recommendations, which were initially held due to borderline blood pressure. -Cardiology following.  5.  Anemia/vitamin B12 deficiency. -Baseline hemoglobin approximately 9-11. -No overt bleeding. -Hemoglobin at 8.6 today from 10.5 yesterday. -Repeat H&H this afternoon. -Anemia panel with iron of 22, TIBC of 301, ferritin of 77, folate of 8.6, vitamin B12 levels of 151 -Vitamin B12 1000 MCG's subcu daily x7 days or while in the hospital and could transition to oral vitamin B12 supplementation on discharge.. -Transfusion threshold hemoglobin < 7. -Follow H&H.  6.  Left shoulder pain -Secondary to mechanical fall. -Imaging studies done with no acute fracture. -Per orthopedics.  7.  Hypertension -Currently on Norvasc, Coreg, Lasix.   -Imdur  and lisinopril held as blood pressure was borderline on presentation which has improved and per cardiology recommending resumption of lisinopril and Imdur which will be ordered.    8.  Polymyalgia rheumatica on chronic steroids -Continue home regimen prednisone.  9.  Chronic pain -Patient being followed in the outpatient setting by pain management. -Oxycodone as needed. -Further pain management due to acute fracture per orthopedics.  10.  Depression/anxiety -Continue Celexa  11.  AAA -Patient with known 4.1 cm AAA for which she supposed to undergo endovascular stenting in December with vascular surgery. -Outpatient follow-up with vascular surgery.  12.  Hyperlipidemia -Continue statin, Zetia.  13.  Prior history of tobacco abuse -Per admitting physician patient noted to have had a 60-pack-year however no ongoing tobacco use since being diagnosed with cancer 3 years ago.  14.  GERD with history of esophageal stricture -PPI.   DVT prophylaxis: Per orthopedics. Code Status: Full Family Communication: Updated patient.  No family at bedside. Disposition:   Status is: Inpatient  Remains inpatient appropriate because: Ongoing inpatient care.  Unsafe disposition at this time.  Status post left total hip arthroplasty today 12/04/2020 needing further evaluation by PT/OT and orthopedics.  Not stable for discharge.       Consultants:  Cardiology: Dr. Humphrey Rolls 12/03/2020 Orthopedics: Dr. Mable Fill 12/03/2020  Procedures:  CT head 12/03/2020 CT chest abdomen and pelvis 12/03/2020 Plain films of the pelvis 12/04/2020 Left  total hip arthroplasty per Dr. Lyla Glassing 12/04/2020  Antimicrobials:  None   Subjective: Sitting up in bed.  Feeling better.  No chest pain.  No significant shortness of breath.  No abdominal pain.  States left hip pain improved postop.  Stated ambulated with physical therapy in the hallway.  States he has an appointment with his chronic pain medication doctor  tomorrow.  Objective: Vitals:   12/05/20 0435 12/05/20 0805 12/05/20 0813 12/05/20 1154  BP: (!) 145/68  (!) 141/80 104/60  Pulse: 80  83 75  Resp:   20 20  Temp: 98 F (36.7 C)  98 F (36.7 C) 98.6 F (37 C)  TempSrc: Oral  Oral Oral  SpO2: 93% 94% 100% 92%  Weight:      Height:        Intake/Output Summary (Last 24 hours) at 12/05/2020 1243 Last data filed at 12/05/2020 0857 Gross per 24 hour  Intake 1718.26 ml  Output 1180 ml  Net 538.26 ml    Filed Weights   12/03/20 1932 12/03/20 2038  Weight: 79.4 kg 81.9 kg    Examination:  General exam: NAD. Respiratory system: Lungs clear to auscultation bilaterally.  No wheezes, no crackles, no rhonchi.  Normal respiratory effort. Cardiovascular system: Regular rate rhythm no murmurs rubs or gallops.  No JVD.  No lower extremity edema. Gastrointestinal system: Abdomen soft, nontender, nondistended, positive bowel sounds.  No rebound.  No guarding.  Central nervous system: Alert and oriented. No focal neurological deficits. Extremities: Left hip with dressing in place.  Skin: No rashes, lesions or ulcers Psychiatry: Judgement and insight appear normal. Mood & affect appropriate.     Data Reviewed: I have personally reviewed following labs and imaging studies  CBC: Recent Labs  Lab 12/03/20 0720 12/03/20 0801 12/04/20 0431 12/04/20 1038 12/05/20 0445  WBC 11.1*  --  5.7  --  8.0  HGB 9.7* 10.5* 9.6* 10.5* 8.6*  HCT 32.6* 31.0* 33.6* 31.0* 27.8*  MCV 80.9  --  82.2  --  80.3  PLT 159  --  136*  --  122*     Basic Metabolic Panel: Recent Labs  Lab 12/03/20 0720 12/03/20 0801 12/04/20 0431 12/04/20 1038 12/05/20 0445  NA 135 135 138 137 136  K 3.6 3.6 3.8 4.5 3.8  CL 98 97* 100  --  102  CO2 29  --  29  --  28  GLUCOSE 129* 127* 95  --  166*  BUN 17 17 17   --  21  CREATININE 0.90 0.80 0.78  --  0.76  CALCIUM 8.4*  --  8.5*  --  8.2*  MG  --   --   --   --  1.7     GFR: Estimated Creatinine  Clearance: 77.3 mL/min (by C-G formula based on SCr of 0.76 mg/dL).  Liver Function Tests: Recent Labs  Lab 12/03/20 0720  AST 19  ALT 14  ALKPHOS 99  BILITOT 1.0  PROT 6.0*  ALBUMIN 3.1*     CBG: Recent Labs  Lab 12/04/20 1423  GLUCAP 178*      Recent Results (from the past 240 hour(s))  Resp Panel by RT-PCR (Flu A&B, Covid) Nasopharyngeal Swab     Status: None   Collection Time: 12/03/20  7:20 AM   Specimen: Nasopharyngeal Swab; Nasopharyngeal(NP) swabs in vial transport medium  Result Value Ref Range Status   SARS Coronavirus 2 by RT PCR NEGATIVE NEGATIVE Final    Comment: (NOTE) SARS-CoV-2 target  nucleic acids are NOT DETECTED.  The SARS-CoV-2 RNA is generally detectable in upper respiratory specimens during the acute phase of infection. The lowest concentration of SARS-CoV-2 viral copies this assay can detect is 138 copies/mL. A negative result does not preclude SARS-Cov-2 infection and should not be used as the sole basis for treatment or other patient management decisions. A negative result may occur with  improper specimen collection/handling, submission of specimen other than nasopharyngeal swab, presence of viral mutation(s) within the areas targeted by this assay, and inadequate number of viral copies(<138 copies/mL). A negative result must be combined with clinical observations, patient history, and epidemiological information. The expected result is Negative.  Fact Sheet for Patients:  EntrepreneurPulse.com.au  Fact Sheet for Healthcare Providers:  IncredibleEmployment.be  This test is no t yet approved or cleared by the [place] FDA and  has been authorized for detection and/or diagnosis of SARS-CoV-2 by FDA under an Emergency Use Authorization (EUA). This EUA will remain  in effect (meaning this test can be used) for the duration of the COVID-19 declaration under Section 564(b)(1) of the Act,  21 U.S.C.section 360bbb-3(b)(1), unless the authorization is terminated  or revoked sooner.       Influenza A by PCR NEGATIVE NEGATIVE Final   Influenza B by PCR NEGATIVE NEGATIVE Final    Comment: (NOTE) The Xpert Xpress SARS-CoV-2/FLU/RSV plus assay is intended as an aid in the diagnosis of influenza from Nasopharyngeal swab specimens and should not be used as a sole basis for treatment. Nasal washings and aspirates are unacceptable for Xpert Xpress SARS-CoV-2/FLU/RSV testing.  Fact Sheet for Patients: EntrepreneurPulse.com.au  Fact Sheet for Healthcare Providers: IncredibleEmployment.be  This test is not yet approved or cleared by the [place] FDA and has been authorized for detection and/or diagnosis of SARS-CoV-2 by FDA under an Emergency Use Authorization (EUA). This EUA will remain in effect (meaning this test can be used) for the duration of the COVID-19 declaration under Section 564(b)(1) of the Act, 21 U.S.C. section 360bbb-3(b)(1), unless the authorization is terminated or revoked.  Performed at [facility], [place] [address] [place], [place] [address]           Radiology Studies: Pelvis Portable  Result Date: 12/04/2020 CLINICAL DATA:  LEFT hip replacement. EXAM: PORTABLE PELVIS 1-2 VIEWS COMPARISON:  Prior studies FINDINGS: LEFT total hip arthroplasty changes are noted without acute abnormality or complicating hardware features. No dislocation or acute fracture. Fusion hardware within the LOWER lumbar spine identified. IMPRESSION: LEFT total hip arthroplasty without complicating features. Electronically Signed   By: Margarette Canada M.D.   On: 12/04/2020 14:27   DG Knee Left Port  Result Date: 12/03/2020 CLINICAL DATA:  Left hip fracture. EXAM: PORTABLE LEFT KNEE - 1-2 VIEW COMPARISON:  None. FINDINGS: The knee joint demonstrates moderate degenerative changes but no acute fracture. No joint effusion. Advanced  vascular calcifications are noted. IMPRESSION: Moderate degenerative changes but no acute bony findings or joint effusion. Electronically Signed   By: Marijo Sanes M.D.   On: 12/03/2020 18:45   DG C-Arm 1-60 Min-No Report  Result Date: 12/04/2020 Fluoroscopy was utilized by the requesting physician.  No radiographic interpretation.   DG C-Arm 1-60 Min-No Report  Result Date: 12/04/2020 Fluoroscopy was utilized by the requesting physician.  No radiographic interpretation.   ECHOCARDIOGRAM COMPLETE  Result Date: 12/05/2020    ECHOCARDIOGRAM REPORT   Patient Name:   Brandon [name] Date of Exam: 12/04/2020 Medical Rec #:  [id number]  Height:       66.5 in Accession #:    [id number]   Weight:       180.6 lb Date of Birth:  [birth year]    BSA:          1.925 m Patient Age:    71 years     BP:           94/67 mmHg Patient Gender: M            HR:           84 bpm. Exam Location:  Inpatient Procedure: 2D Echo, Cardiac Doppler and Color Doppler Indications:    Elevated Troponin  History:        Patient has prior history of Echocardiogram examinations, most                 recent 11/07/2020. Risk Factors:Hypertension, Dyslipidemia and                 Former Smoker. S/p TAVR (38mm Sapien).  Sonographer:    Alvino Chapel RCS Referring Phys: [id number] RONDELL A SMITH  Sonographer Comments: Technically difficult study. Patient just had left hip surgery 12/04/20. Could not turn or position patient. IMPRESSIONS  1. Left ventricular ejection fraction, by estimation, is 60 to 65%. The left ventricle has normal function. The left ventricle has no regional wall motion abnormalities. Left ventricular diastolic parameters are consistent with Grade I diastolic dysfunction (impaired relaxation).  2. Right ventricular systolic function is normal. The right ventricular size is normal. Tricuspid regurgitation signal is inadequate for assessing PA pressure.  3. The mitral valve is normal in structure. No evidence of mitral valve  regurgitation. No evidence of mitral stenosis.  4. The aortic valve was not well visualized. Aortic valve regurgitation is not visualized. Mild aortic valve stenosis. Aortic valve area, by VTI measures 1.89 cm. Aortic valve mean gradient measures 17.5 mmHg. Aortic valve Vmax measures 2.56 m/s.  5. The inferior vena cava is normal in size with greater than 50% respiratory variability, suggesting right atrial pressure of 3 mmHg. FINDINGS  Left Ventricle: Left ventricular ejection fraction, by estimation, is 60 to 65%. The left ventricle has normal function. The left ventricle has no regional wall motion abnormalities. Definity contrast agent was given IV to delineate the left ventricular  endocardial borders. The left ventricular internal cavity size was normal in size. There is no left ventricular hypertrophy. Left ventricular diastolic parameters are consistent with Grade I diastolic dysfunction (impaired relaxation). Right Ventricle: The right ventricular size is normal. No increase in right ventricular wall thickness. Right ventricular systolic function is normal. Tricuspid regurgitation signal is inadequate for assessing PA pressure. Left Atrium: Left atrial size was not well visualized. Right Atrium: Right atrial size was not well visualized. Pericardium: There is no evidence of pericardial effusion. Mitral Valve: The mitral valve is normal in structure. Mild mitral annular calcification. No evidence of mitral valve regurgitation. No evidence of mitral valve stenosis. Tricuspid Valve: The tricuspid valve is normal in structure. Tricuspid valve regurgitation is not demonstrated. No evidence of tricuspid stenosis. Aortic Valve: The aortic valve was not well visualized. Aortic valve regurgitation is not visualized. Mild aortic stenosis is present. Aortic valve mean gradient measures 17.5 mmHg. Aortic valve peak gradient measures 26.2 mmHg. Aortic valve area, by VTI  measures 1.89 cm. Pulmonic Valve: The pulmonic  valve was normal in structure. Pulmonic valve regurgitation is not visualized. No evidence of pulmonic stenosis. Aorta: The aortic root is normal  in size and structure. Venous: The inferior vena cava is normal in size with greater than 50% respiratory variability, suggesting right atrial pressure of 3 mmHg. IAS/Shunts: The interatrial septum appears to be lipomatous. No atrial level shunt detected by color flow Doppler.  LEFT VENTRICLE PLAX 2D LVIDd:         4.30 cm   Diastology LVIDs:         2.20 cm   LV e' medial:    4.79 cm/s LVOT diam:     1.80 cm   LV E/e' medial:  16.2 LV SV:         86        LV e' lateral:   10.90 cm/s LV SV Index:   45        LV E/e' lateral: 7.1 LVOT Area:     2.54 cm  RIGHT VENTRICLE RV S prime:     26.40 cm/s TAPSE (M-mode): 2.0 cm LEFT ATRIUM             Index LA Biplane Vol: 92.9 ml 48.25 ml/m  AORTIC VALVE AV Area (Vmax):    1.64 cm AV Area (Vmean):   1.47 cm AV Area (VTI):     1.89 cm AV Vmax:           256.00 cm/s AV Vmean:          202.000 cm/s AV VTI:            0.453 m AV Peak Grad:      26.2 mmHg AV Mean Grad:      17.5 mmHg LVOT Vmax:         165.00 cm/s LVOT Vmean:        117.000 cm/s LVOT VTI:          0.337 m LVOT/AV VTI ratio: 0.74  AORTA Ao Root diam: 3.40 cm MITRAL VALVE MV Area (PHT): 2.69 cm     SHUNTS MV Decel Time: 282 msec     Systemic VTI:  0.34 m MV E velocity: 77.70 cm/s   Systemic Diam: 1.80 cm MV A velocity: 133.00 cm/s MV E/A ratio:  0.58 Fransico Him MD Electronically signed by Fransico Him MD Signature Date/Time: 12/05/2020/8:17:05 AM    Final    DG HIP UNILAT WITH PELVIS 2-3 VIEWS LEFT  Result Date: 12/04/2020 CLINICAL DATA:  LEFT hip fracture. EXAM: DG HIP (WITH OR WITHOUT PELVIS) 2-3V LEFT COMPARISON:  Radiograph 12/03/2020 FINDINGS: Four spot intraoperative radiographs demonstrate LEFT hip total arthroplasty for repair of LEFT femoral neck fracture. IMPRESSION: LEFT hip arthroplasty without complication. Electronically Signed   By: Suzy Bouchard M.D.   On: 12/04/2020 11:11        Scheduled Meds:  allopurinol  200 mg Oral Daily   amLODipine  10 mg Oral Daily   aspirin  81 mg Oral BID WC   atorvastatin  80 mg Oral Daily   budesonide (PULMICORT) nebulizer solution  0.5 mg Nebulization BID   carvedilol  12.5 mg Oral BID   citalopram  20 mg Oral Daily   clopidogrel  75 mg Oral Daily   cyanocobalamin  1,000 mcg Subcutaneous Daily   docusate sodium  100 mg Oral BID   ezetimibe  10 mg Oral Daily   furosemide  20 mg Oral Daily   gabapentin  300 mg Oral TID   guaiFENesin  1,200 mg Oral BID   ipratropium-albuterol  3 mL Nebulization BID   isosorbide mononitrate  30 mg Oral Daily  lisinopril  20 mg Oral Daily   mirtazapine  30 mg Oral QHS   pantoprazole  40 mg Oral Daily   potassium chloride  20 mEq Oral Daily   predniSONE  4 mg Oral Q breakfast   senna  1 tablet Oral BID   senna-docusate  1 tablet Oral QHS   tamsulosin  0.4 mg Oral QHS   Continuous Infusions:  sodium chloride Stopped (12/05/20 0936)   magnesium sulfate bolus IVPB 4 g (12/05/20 1133)   methocarbamol (ROBAXIN) IV Stopped (12/04/20 1356)     LOS: 2 days    Time spent: 35 mins    Irine Seal, MD [facility]   To contact the attending provider between 7A-7P or the covering provider during after hours 7P-7A, please log into the web site [email or web address] and access using universal [place] password for that web site. If you do not have the password, please call the hospital operator.  12/05/2020, 12:43 PM

## 2020-12-05 NOTE — TOC Progression Note (Signed)
Transition of Care [facility]) - Progression Note    Patient Details  Name: Brandon [name] MRN: [id number] Date of Birth: 1940-09-30  [facility] of [facility] CM/SW Contact  Quamaine Webb, Juliann Pulse, RN Phone Number: 12/05/2020, 3:30 PM  Clinical Narrative: [facility] rep Stacie able to accept for HHPT-aware of d/c in am if stable. See prior note for additional info.      Expected Discharge Plan: [place] Barriers to Discharge: Continued Medical Work up  Expected Discharge Plan and Services Expected Discharge Plan: [place]   Discharge Planning Services: CM Consult Post Acute Care Choice: [place] arrangements for the past 2 months: Single Family Home                 DME Arranged: Walker rolling DME Agency: [employer] Date DME Agency Contacted: 12/05/20 Time DME Agency Contacted: 1520 Representative spoke with at DME Agency: Brenton Grills HH Arranged: PT [place]: [place] Date [place]: 12/05/20 Time [place]: [place] Representative spoke with at [place]: [place] ([place]) Interventions    Readmission Risk Interventions No flowsheet data found.

## 2020-12-05 NOTE — Progress Notes (Signed)
    Subjective:  Patient reports pain as mild to moderate.  Denies N/V/CP/SOB.   Objective:   VITALS:   Vitals:   12/04/20 2051 12/05/20 0435 12/05/20 0805 12/05/20 0813  BP:  (!) 145/68  (!) 141/80  Pulse:  80  83  Resp:    20  Temp:  98 F (36.7 C)  98 F (36.7 C)  TempSrc:  Oral  Oral  SpO2: 96% 93% 94% 100%  Weight:      Height:        NAD Neurovascular intact Sensation intact distally Intact pulses distally Dorsiflexion/Plantar flexion intact Incision: dressing C/D/I   Lab Results  Component Value Date   WBC 8.0 12/05/2020   HGB 8.6 (L) 12/05/2020   HCT 27.8 (L) 12/05/2020   MCV 80.3 12/05/2020   PLT 122 (L) 12/05/2020   BMET    Component Value Date/Time   NA 136 12/05/2020 0445   NA 138 06/15/2020 1413   K 3.8 12/05/2020 0445   CL 102 12/05/2020 0445   CO2 28 12/05/2020 0445   GLUCOSE 166 (H) 12/05/2020 0445   BUN 21 12/05/2020 0445   BUN 19 06/15/2020 1413   CREATININE 0.76 12/05/2020 0445   CREATININE 0.77 09/08/2020 1407   CALCIUM 8.2 (L) 12/05/2020 0445   EGFR 92 09/08/2020 1407   EGFR 100 06/15/2020 1413   GFRNONAA >60 12/05/2020 0445   GFRNONAA 92 08/11/2020 1024     Assessment/Plan: 1 Day Post-Op   Principal Problem:   Closed displaced fracture of left femoral neck (HCC) Active Problems:   Coronary artery disease   Hypertension   Aneurysm of infrarenal abdominal aorta   Hypochromic anemia   Anxiety and depression   Chronic pain syndrome   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HCC)   Chronic obstructive pulmonary disease ([place])   Fall at home, initial encounter   Acute on chronic respiratory failure with hypoxia (HCC)   Elevated troponin   WBAT with walker DVT ppx:  Plavix and Aspirin , SCDs, TEDS   Continue Plavix at D/C at take ASA $Remove'81mg'HtOgQDP$  BID for DVT PO pain control PT/OT ABLA: HgB 8.6 this AM. Monitor and treat per hospitalist recommendations Dispo: Once patient clears therapy, clear for discharge from orthopedic standpoint.  Follow up in 2 weeks for incision check and radiographs     Dorothyann Peng 12/05/2020, 9:08 AM   [facility] is now [employer] [address] [place], [place], [place] [address] Phone: [phone] [email or web address]  [employer]

## 2020-12-05 NOTE — Progress Notes (Signed)
Initial Nutrition Assessment  DOCUMENTATION CODES:   Not applicable  INTERVENTION:  - will order 1 tablet multivitamin with minerals/day.    NUTRITION DIAGNOSIS:   Increased nutrient needs related to chronic illness, post-op healing as evidenced by estimated needs.  GOAL:   Patient will meet greater than or equal to 90% of their needs  MONITOR:   PO intake, Labs, Weight trends  REASON FOR ASSESSMENT:   Malnutrition Screening Tool  ASSESSMENT:   80 y.o. 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 He presented to the ED after a fall at home with subsequent severe L hip pain.  Patient ate 100% of dinner last night. He reports eating 100% of a larger breakfast this AM and he had received lunch tray shortly before RD visit.   Patient reports that during/following XRT in 2019 he had difficulty with swallowing, but that this has not been an issue for quite some time.   He reports that following XRT he had aimed to gain ~20 lb but ended up gaining closer to 35 lb and that he is now working to lose some weight.   He reports that he walked in the hall earlier today and felt good overall, some pain to incision site with ambulation.  Weight on 10/29 was 180 lb, weight on 10/21 was 186 lb, weight on 9/15 was 178 lb, and weight on 8/18 was 181 lb.   Patient reports that he was informed that he will be discharged tomorrow afternoon. No nutrition related questions or concerns from patient at this time.    Labs reviewed; Ca: 8.2 mg/dl.  Medications reviewed; 1000 mcg subutaneous cyanocobalamin/day, 100 mg colace BID, 20 mg oral, 40 mg oral protonix/day, 20 mEq Klor-Con/day, 4 mg  deltasone/day, 1 tablet senokot BID. IVF; NS @ 100 ml/hr.     NUTRITION - FOCUSED PHYSICAL EXAM:  Completed; no muscle or fat depletions, mild edema to LLE.  Diet Order:   Diet Order             Diet Heart Room service appropriate? Yes; Fluid consistency: Thin  Diet effective now                   EDUCATION NEEDS:   No education needs have been identified at this time  Skin:  Skin Assessment: Skin Integrity Issues: Skin Integrity Issues:: Incisions, Other (Comment) Incisions: L hip (10/30) Other: skin tears to L elbow, R elbow x3, and bilateral pre-tibial area  Last BM:  PTA/unknown  Height:   Ht Readings from Last 1 Encounters:  12/03/20 5' 6.5" (1.689 m)    Weight:   Wt Readings from Last 1 Encounters:  12/03/20 81.9 kg     Estimated Nutritional Needs:  Kcal:  1650-1900 kcal Protein:  85-100 grams Fluid:  >/= 1.8 L/day      Jarome Matin, MS, RD, LDN, CNSC Inpatient Clinical Dietitian RD pager # available in [facility]  After hours/weekend pager # available in [facility]

## 2020-12-05 NOTE — Progress Notes (Signed)
Physical Therapy Treatment Patient Details Name: Brandon [name] MRN: [id number] DOB: [birth year] Today's Date: 12/05/2020   History of Present Illness 80 yo male admitted with L femoral neck fx s/p THA-DA 12/04/20 and L shoulder pain after fall at home.    PT Comments    Progressing with mobility. Pt reports increased pain this afternoon compared to this morning. Will continue to follow and progress activity. Will plan to practice stair negotiation prior to possible d/c home on tomorrow.     Recommendations for follow up therapy are one component of a multi-disciplinary discharge planning process, led by the attending physician.  Recommendations may be updated based on patient status, additional functional criteria and insurance authorization.  Follow Up Recommendations  Home health PT     Assistance Recommended at Discharge Frequent or constant Supervision/Assistance  Equipment Recommendations  Rolling walker (2 wheels)    Recommendations for Other Services       Precautions / Restrictions Precautions Precautions: Fall Restrictions Weight Bearing Restrictions: No Other Position/Activity Restrictions: WBAT     Mobility  Bed Mobility Overal bed mobility: Needs Assistance Bed Mobility: Sit to Supine     Supine to sit: Min assist;HOB elevated Sit to supine: Min assist   General bed mobility comments: Small amount of assist for L LE. Cues provided.    Transfers Overall transfer level: Needs assistance Equipment used: Rolling walker (2 wheels) Transfers: Sit to/from Stand Sit to Stand: Min guard           General transfer comment: Cues for safety, technique, hand placement.    Ambulation/Gait Ambulation/Gait assistance: Min guard;Min assist Gait Distance (Feet): 120 Feet Assistive device: Rolling walker (2 wheels) Gait Pattern/deviations: Step-through pattern;Decreased stride length     General Gait Details: Intermittent assist to steady. Cues for safety,  technique, sequence. Pt transitioned to step through pattern as distance increased.   Stairs             Wheelchair Mobility    Modified Rankin (Stroke Patients Only)       Balance Overall balance assessment: Needs assistance;History of Falls         Standing balance support: Bilateral upper extremity supported Standing balance-Leahy Scale: Poor                              Cognition Arousal/Alertness: Awake/alert Behavior During Therapy: WFL for tasks assessed/performed Overall Cognitive Status: Within Functional Limits for tasks assessed                                          Exercises      General Comments        Pertinent Vitals/Pain Pain Assessment: 0-10 Pain Score: 7  Pain Location: L hip/thigh Pain Descriptors / Indicators: Discomfort;Sore Pain Intervention(s): Limited activity within patient's tolerance;Monitored during session;Patient requesting pain meds-RN notified;Repositioned    Home Living                          Prior Function            PT Goals (current goals can now be found in the care plan section) Acute Rehab PT Goals Patient Stated Goal: regain independence/PLOF PT Goal Formulation: With patient Time For Goal Achievement: 12/19/20 Potential to Achieve Goals: Good Progress towards PT goals: Progressing toward goals  Frequency    Min 3X/week      PT Plan Current plan remains appropriate    Co-evaluation              AM-PAC PT "6 Clicks" Mobility   Outcome Measure  Help needed turning from your back to your side while in a flat bed without using bedrails?: A Little Help needed moving from lying on your back to sitting on the side of a flat bed without using bedrails?: A Little Help needed moving to and from a bed to a chair (including a wheelchair)?: A Little Help needed standing up from a chair using your arms (e.g., wheelchair or bedside chair)?: A Little Help  needed to walk in hospital room?: A Little Help needed climbing 3-5 steps with a railing? : A Little 6 Click Score: 18    End of Session Equipment Utilized During Treatment: Gait belt Activity Tolerance: Patient tolerated treatment well Patient left: in bed;with call bell/phone within reach;with bed alarm set   PT Visit Diagnosis: Difficulty in walking, not elsewhere classified (R26.2);Other abnormalities of gait and mobility (R26.89)     Time: 1445-1456 PT Time Calculation (min) (ACUTE ONLY): 11 min  Charges:  $Gait Training: 8-22 mins                         Doreatha Massed, PT Acute Rehabilitation  Office: [phone] Pager: [phone]

## 2020-12-05 NOTE — Evaluation (Addendum)
Physical Therapy Evaluation Patient Details Name: Brandon [name] MRN: [id number] DOB: 1940/10/30 Today's Date: 12/05/2020  History of Present Illness  80 yo male admitted with L femoral neck fx s/p THA-DA 12/04/20 and L shoulder pain after fall at home.  Clinical Impression  On eval, pt required Min A for mobility. He walked ~120 feet with a RW. Moderate pain with activity but pt participated well. Will plan to follow and progress activity as tolerated. PT recommendation is for HHPT.        Recommendations for follow up therapy are one component of a multi-disciplinary discharge planning process, led by the attending physician.  Recommendations may be updated based on patient status, additional functional criteria and insurance authorization.  Follow Up Recommendations Home health PT    Assistance Recommended at Discharge Frequent or constant Supervision/Assistance  Functional Status Assessment Patient has had a recent decline in their functional status and demonstrates the ability to make significant improvements in function in a reasonable and predictable amount of time.  Clinical biochemist    Recommendations for Other Services       Precautions / Restrictions Precautions Precautions: Fall Restrictions Weight Bearing Restrictions: No Other Position/Activity Restrictions: WBAT      Mobility  Bed Mobility Overal bed mobility: Needs Assistance Bed Mobility: Supine to Sit     Supine to sit: Min assist;HOB elevated     General bed mobility comments: Small amount of assist for L LE. Cues provided.    Transfers Overall transfer level: Needs assistance Equipment used: Rolling walker (2 wheels) Transfers: Sit to/from Stand Sit to Stand: Min assist           General transfer comment: Small amount of assist to rise, steady, control descent. Cues for safety, technique, hand placement.    Ambulation/Gait Ambulation/Gait assistance: Min assist Gait  Distance (Feet): 120 Feet Assistive device: Rolling walker (2 wheels) Gait Pattern/deviations: Step-through pattern;Decreased stride length     General Gait Details: Intermittent assist to steady. Cues for safety, technique, sequence. Pt transitioned to step through pattern as distance increased.  Stairs            Wheelchair Mobility    Modified Rankin (Stroke Patients Only)       Balance Overall balance assessment: Needs assistance;History of Falls         Standing balance support: Bilateral upper extremity supported Standing balance-Leahy Scale: Poor                               Pertinent Vitals/Pain Pain Assessment: 0-10 Pain Score: 6  Pain Location: L hip/thigh Pain Descriptors / Indicators: Discomfort;Sore Pain Intervention(s): Monitored during session    Home Living Family/patient expects to be discharged to:: Private residence Living Arrangements: Spouse/significant other Available Help at Discharge: Family Type of Home: House Home Access: Stairs to enter Entrance Stairs-Rails: Right Entrance Stairs-Number of Steps: 1(no rail) then 3 (R rail)   Home Layout: One level Home Equipment: None      Prior Function Prior Level of Function : Independent/Modified Independent                     Hand Dominance        Extremity/Trunk Assessment   Upper Extremity Assessment Upper Extremity Assessment: Generalized weakness    Lower Extremity Assessment Lower Extremity Assessment: Generalized weakness    Cervical / Trunk Assessment Cervical / Trunk Assessment: Normal  Communication  Communication: HOH  Cognition Arousal/Alertness: Awake/alert Behavior During Therapy: WFL for tasks assessed/performed Overall Cognitive Status: Within Functional Limits for tasks assessed                                          General Comments      Exercises     Assessment/Plan    PT Assessment Patient needs continued  PT services  PT Problem List Decreased strength;Decreased mobility;Decreased range of motion;Decreased activity tolerance;Decreased balance;Decreased knowledge of use of DME;Pain       PT Treatment Interventions DME instruction;Gait training;Therapeutic activities;Therapeutic exercise;Patient/family education;Balance training;Functional mobility training    PT Goals (Current goals can be found in the Care Plan section)  Acute Rehab PT Goals Patient Stated Goal: regain independence/PLOF PT Goal Formulation: With patient Time For Goal Achievement: 12/19/20 Potential to Achieve Goals: Good    Frequency Min 3X/week   Barriers to discharge        Co-evaluation               AM-PAC PT "6 Clicks" Mobility  Outcome Measure Help needed turning from your back to your side while in a flat bed without using bedrails?: A Little Help needed moving from lying on your back to sitting on the side of a flat bed without using bedrails?: A Little Help needed moving to and from a bed to a chair (including a wheelchair)?: A Little Help needed standing up from a chair using your arms (e.g., wheelchair or bedside chair)?: A Little Help needed to walk in hospital room?: A Little Help needed climbing 3-5 steps with a railing? : A Little 6 Click Score: 18    End of Session Equipment Utilized During Treatment: Gait belt Activity Tolerance: Patient tolerated treatment well Patient left: in chair;with call bell/phone within reach;with chair alarm set   PT Visit Diagnosis: Difficulty in walking, not elsewhere classified (R26.2);Other abnormalities of gait and mobility (R26.89)    Time: 3606-7703 PT Time Calculation (min) (ACUTE ONLY): 20 min   Charges:   PT Evaluation $PT Eval Moderate Complexity: 1 Mod             Doreatha Massed, PT Acute Rehabilitation  Office: [phone] Pager: [phone]

## 2020-12-05 NOTE — TOC Progression Note (Signed)
Transition of Care [facility]) - Progression Note    Patient Details  Name: JASMAN MURRI MRN: [id number] Date of Birth: Jun 21, 1940  [facility] CM/SW Contact  Michelina Mexicano, Juliann Pulse, RN Phone Number: 12/05/2020, 3:20 PM  Clinical Narrative: spoke to spouse about d/c plans-informed of PT recc HHPT-no preference on [facility] agency or dme company-Will await [place] agency to accept;[employer] rep jermaine aware of hme rw order to deliver to rm prior d/c. Family to transport home on own.      Expected Discharge Plan: [place] Barriers to Discharge: Continued Medical Work up  Expected Discharge Plan and Services Expected Discharge Plan: [place]   Discharge Planning Services: CM Consult Post Acute Care Choice: [place] arrangements for the past 2 months: Single Family Home                 DME Arranged: Walker rolling DME Agency: [employer] Date DME Agency Contacted: 12/05/20 Time DME Agency Contacted: 1520 Representative spoke with at DME Agency: [place] Determinants of Health ([place]) Interventions    Readmission Risk Interventions No flowsheet data found.

## 2020-12-05 NOTE — Evaluation (Addendum)
Occupational Therapy Evaluation Patient Details Name: Brandon [name] MRN: [id number] DOB: 1940/11/02 Today's Date: 12/05/2020   History of Present Illness 80 yo male admitted with L femoral neck fx s/p THA-DA 12/04/20 and L shoulder pain after fall at home.   Clinical Impression   Patient is a pleasant 80 year old male who was admitted for above.patient was living at home with wife prior level with no AD and independence in ADLs.  Patient was noted to require mod A for LB dressing and min guard for proper use of walker during ADL tasks.  Patient would continue to benefit from skilled OT services at this time while admitted and after d/c to address noted deficits in order to improve overall safety and independence in ADLs.       Recommendations for follow up therapy are one component of a multi-disciplinary discharge planning process, led by the attending physician.  Recommendations may be updated based on patient status, additional functional criteria and insurance authorization.   Follow Up Recommendations  Home health OT    Assistance Recommended at Discharge Frequent or constant Supervision/Assistance  Functional Status Assessment  Patient has had a recent decline in their functional status and demonstrates the ability to make significant improvements in function in a reasonable and predictable amount of time.  Equipment Recommendations  Other (comment) (total hip kit)    Recommendations for Other Services       Precautions / Restrictions Precautions Precautions: Fall Restrictions Weight Bearing Restrictions: No Other Position/Activity Restrictions: WBAT      Mobility Bed Mobility Overal bed mobility: Needs Assistance Bed Mobility: Sit to Supine     Supine to sit: Min assist;HOB elevated Sit to supine: Min assist   General bed mobility comments: Small amount of assist for L LE. Cues provided.    Transfers Overall transfer level: Needs assistance Equipment used:  Rolling walker (2 wheels) Transfers: Sit to/from Stand Sit to Stand: Min guard           General transfer comment: Cues for safety, technique, hand placement.      Balance Overall balance assessment: Needs assistance;History of Falls         Standing balance support: Bilateral upper extremity supported Standing balance-Leahy Scale: Poor                             ADL either performed or assessed with clinical judgement   ADL Overall ADL's : Needs assistance/impaired Eating/Feeding: Modified independent;Sitting   Grooming: Wash/dry face;Oral care;Sitting;Standing;Supervision/safety Grooming Details (indicate cue type and reason): paient needed cues to keep RW with him for all tasks. Upper Body Bathing: Supervision/ safety;Sitting   Lower Body Bathing: Supervison/ safety;Sit to/from stand;Sitting/lateral leans Lower Body Bathing Details (indicate cue type and reason): education on getting long handled sponge. patient reported wife would assist with this Upper Body Dressing : Set up;Sitting   Lower Body Dressing: Moderate assistance;Sit to/from stand Lower Body Dressing Details (indicate cue type and reason): patient able to doff sock off RLE and don but unable to lift LLE up to doff/don sock. Toilet Transfer: Min guard;Ambulation;Rolling walker (2 wheels)   Toileting- Clothing Manipulation and Hygiene: Min guard;Sit to/from stand when transferring to commode in bathroom. Patient was noted to have some pink tinted discharge on floor with red clot like discharge in middle. Patient reported it looked like a scab that he normally gets when on blood thinners. Patients was noted to have some active bleeding under  dressing on L posterior elbow with dressing having no longer sealed to skin. Nurse was immediately contacted at this time. Nurse contributed discharge to active bleeding on L posterior elbow. Patients dressing was changed.        Functional mobility during  ADLs: Rolling walker (2 wheels)       Vision Patient Visual Report: No change from baseline       Perception     Praxis      Pertinent Vitals/Pain Pain Assessment: 0-10 Pain Score: 7  Pain Location: L hip/thigh Pain Descriptors / Indicators: Discomfort;Sore Pain Intervention(s): Limited activity within patient's tolerance;Monitored during session;Repositioned     Hand Dominance Right   Extremity/Trunk Assessment Upper Extremity Assessment Upper Extremity Assessment: Generalized weakness (patient reported pain in LUE but able to range [facility]. patient reported pain wiht all movement. patient noted to have active bleeding on dressing on L posterior elbow. patient noted to have various other dressings on RUE posteriorly as well but not active)   Lower Extremity Assessment Lower Extremity Assessment: Defer to PT evaluation   Cervical / Trunk Assessment Cervical / Trunk Assessment: Normal   Communication Communication Communication: HOH   Cognition Arousal/Alertness: Awake/alert Behavior During Therapy: WFL for tasks assessed/performed Overall Cognitive Status: Within Functional Limits for tasks assessed                                       General Comments       Exercises     Shoulder Instructions      Home Living Family/patient expects to be discharged to:: Private residence Living Arrangements: Spouse/significant other Available Help at Discharge: Family Type of Home: House Home Access: Stairs to enter Technical brewer of Steps: 1(no rail) then 3 (R rail) Entrance Stairs-Rails: Right Home Layout: One level     Bathroom Shower/Tub: Occupational psychologist: Standard     Home Equipment: None          Prior Functioning/Environment Prior Level of Function : Independent/Modified Independent                        OT Problem List: Decreased knowledge of use of DME or AE;Impaired balance (sitting and/or  standing);Decreased safety awareness      OT Treatment/Interventions: Self-care/ADL training;Therapeutic exercise;Neuromuscular education;Energy conservation;DME and/or AE instruction;Therapeutic activities;Balance training;Patient/family education    OT Goals(Current goals can be found in the care plan section) Acute Rehab OT Goals Patient Stated Goal: to go home with wife tomorrow OT Goal Formulation: With patient Time For Goal Achievement: 12/19/20 Potential to Achieve Goals: Good  OT Frequency: Min 2X/week   Barriers to D/C:            Co-evaluation              AM-PAC OT "6 Clicks" Daily Activity     Outcome Measure Help from another person eating meals?: None Help from another person taking care of personal grooming?: None Help from another person toileting, which includes using toliet, bedpan, or urinal?: A Little Help from another person bathing (including washing, rinsing, drying)?: A Little Help from another person to put on and taking off regular upper body clothing?: None Help from another person to put on and taking off regular lower body clothing?: A Little 6 Click Score: 21   End of Session Equipment Utilized During Treatment: Rolling walker (2 wheels) Nurse  Communication: Other (comment) (nurse cleared patient to particiapte and redressed wound on RLE)  Activity Tolerance: Patient tolerated treatment well Patient left: in bed;with call bell/phone within reach (pending PT session at end of OT session with PT preping patient)  OT Visit Diagnosis: Unsteadiness on feet (R26.81);Muscle weakness (generalized) (M62.81);Pain                Time: 0340-3524 OT Time Calculation (min): 23 min Charges:  OT General Charges $OT Visit: 1 Visit OT Evaluation $OT Eval Low Complexity: 1 Low OT Treatments $Self Care/Home Management : 8-22 mins  Jackelyn Poling OTR/L, MS Acute Rehabilitation Department Office# [phone] Pager# [phone]   Merlene Morse  Ellyssa Zagal 12/05/2020, 4:38 PM

## 2020-12-05 NOTE — Progress Notes (Addendum)
Progress Note  Patient Name: Brandon [name] Date of Encounter: 12/05/2020  [facility] Cardiologist: Loralie Champagne, MD  [facility] team  Subjective   No chest pain  Inpatient Medications    Scheduled Meds:  allopurinol  200 mg Oral Daily   amLODipine  10 mg Oral Daily   aspirin  81 mg Oral BID WC   atorvastatin  80 mg Oral Daily   budesonide (PULMICORT) nebulizer solution  0.5 mg Nebulization BID   carvedilol  12.5 mg Oral BID   citalopram  20 mg Oral Daily   clopidogrel  75 mg Oral Daily   cyanocobalamin  1,000 mcg Subcutaneous Daily   docusate sodium  100 mg Oral BID   ezetimibe  10 mg Oral Daily   furosemide  20 mg Oral Daily   gabapentin  300 mg Oral TID   guaiFENesin  1,200 mg Oral BID   ipratropium-albuterol  3 mL Nebulization BID   mirtazapine  30 mg Oral QHS   pantoprazole  40 mg Oral Daily   potassium chloride  20 mEq Oral Daily   predniSONE  4 mg Oral Q breakfast   senna  1 tablet Oral BID   senna-docusate  1 tablet Oral QHS   tamsulosin  0.4 mg Oral QHS   Continuous Infusions:  sodium chloride Stopped (12/05/20 0936)   magnesium sulfate bolus IVPB     methocarbamol (ROBAXIN) IV Stopped (12/04/20 1356)   PRN Meds: acetaminophen, fluticasone, HYDROmorphone (DILAUDID) injection, menthol-cetylpyridinium **OR** phenol, methocarbamol **OR** methocarbamol (ROBAXIN) IV, metoCLOPramide **OR** metoCLOPramide (REGLAN) injection, ondansetron **OR** ondansetron (ZOFRAN) IV, oxyCODONE, oxyCODONE, polyethylene glycol, sodium phosphate, sorbitol   Vital Signs    Vitals:   12/04/20 2051 12/05/20 0435 12/05/20 0805 12/05/20 0813  BP:  (!) 145/68  (!) 141/80  Pulse:  80  83  Resp:    20  Temp:  98 F (36.7 C)  98 F (36.7 C)  TempSrc:  Oral  Oral  SpO2: 96% 93% 94% 100%  Weight:      Height:        Intake/Output Summary (Last 24 hours) at 12/05/2020 1039 Last data filed at 12/05/2020 0857 Gross per 24 hour  Intake 3918.26 ml  Output 1180 ml   Net 2738.26 ml   Last 3 Weights 12/03/2020 12/03/2020 11/30/2020  Weight (lbs) 180 lb 8.9 oz 175 lb 182 lb 14.4 oz  Weight (kg) 81.9 kg 79.379 kg 82.963 kg      Telemetry    Atrial fib - Personally Reviewed  ECG    No new - Personally Reviewed  Physical Exam  GEN:  in no acute distress HEENT: normal Neck: no JVD Cardiac: RRR; 2/6 systolic murmur Respiratory:  clear to auscultation bilaterally, normal work of breathing GI: soft, nontender MS: no deformity  Skin: warm and dry, no rash Neuro:  Alert and Oriented x 3, Strength and sensation are intact Psych: euthymic mood, full affect   Labs    High Sensitivity Troponin:   Recent Labs  Lab 12/03/20 0720 12/03/20 0922 12/03/20 1338 12/03/20 1538  TROPONINIHS 33* 111* 212* 347*     Chemistry Recent Labs  Lab 12/03/20 0720 12/03/20 0801 12/04/20 0431 12/04/20 1038 12/05/20 0445  NA 135 135 138 137 136  K 3.6 3.6 3.8 4.5 3.8  CL 98 97* 100  --  102  CO2 29  --  29  --  28  GLUCOSE 129* 127* 95  --  166*  BUN 17 17 17   --  21  CREATININE 0.90 0.80 0.78  --  0.76  CALCIUM 8.4*  --  8.5*  --  8.2*  MG  --   --   --   --  1.7  PROT 6.0*  --   --   --   --   ALBUMIN 3.1*  --   --   --   --   AST 19  --   --   --   --   ALT 14  --   --   --   --   ALKPHOS 99  --   --   --   --   BILITOT 1.0  --   --   --   --   GFRNONAA >60  --  >60  --  >60  ANIONGAP 8  --  9  --  6    Lipids No results for input(s): CHOL, TRIG, HDL, LABVLDL, LDLCALC, CHOLHDL in the last 168 hours.  Hematology Recent Labs  Lab 12/03/20 0720 12/03/20 0801 12/04/20 0431 12/04/20 1038 12/05/20 0445  WBC 11.1*  --  5.7  --  8.0  RBC 4.03*  --  4.09*  --  3.46*  HGB 9.7*   < > 9.6* 10.5* 8.6*  HCT 32.6*   < > 33.6* 31.0* 27.8*  MCV 80.9  --  82.2  --  80.3  MCH 24.1*  --  23.5*  --  24.9*  MCHC 29.8*  --  28.6*  --  30.9  RDW 15.5  --  15.7*  --  15.5  PLT 159  --  136*  --  122*   < > = values in this interval not displayed.    Thyroid No results for input(s): TSH, FREET4 in the last 168 hours.  BNPNo results for input(s): BNP, PROBNP in the last 168 hours.  DDimer No results for input(s): DDIMER in the last 168 hours.   Radiology    Pelvis Portable  Result Date: 12/04/2020 CLINICAL DATA:  LEFT hip replacement. EXAM: PORTABLE PELVIS 1-2 VIEWS COMPARISON:  Prior studies FINDINGS: LEFT total hip arthroplasty changes are noted without acute abnormality or complicating hardware features. No dislocation or acute fracture. Fusion hardware within the LOWER lumbar spine identified. IMPRESSION: LEFT total hip arthroplasty without complicating features. Electronically Signed   By: Margarette Canada M.D.   On: 12/04/2020 14:27   DG Knee Left Port  Result Date: 12/03/2020 CLINICAL DATA:  Left hip fracture. EXAM: PORTABLE LEFT KNEE - 1-2 VIEW COMPARISON:  None. FINDINGS: The knee joint demonstrates moderate degenerative changes but no acute fracture. No joint effusion. Advanced vascular calcifications are noted. IMPRESSION: Moderate degenerative changes but no acute bony findings or joint effusion. Electronically Signed   By: Marijo Sanes M.D.   On: 12/03/2020 18:45   DG C-Arm 1-60 Min-No Report  Result Date: 12/04/2020 Fluoroscopy was utilized by the requesting physician.  No radiographic interpretation.   DG C-Arm 1-60 Min-No Report  Result Date: 12/04/2020 Fluoroscopy was utilized by the requesting physician.  No radiographic interpretation.   ECHOCARDIOGRAM COMPLETE  Result Date: 12/05/2020    ECHOCARDIOGRAM REPORT   Patient Name:   Brandon [name] Date of Exam: 12/04/2020 Medical Rec #:  [id number]    Height:       66.5 in Accession #:    [id number]   Weight:       180.6 lb Date of Birth:  04-21-1940    BSA:  1.925 m Patient Age:    81 years     BP:           94/67 mmHg Patient Gender: M            HR:           84 bpm. Exam Location:  Inpatient Procedure: 2D Echo, Cardiac Doppler and Color Doppler Indications:     Elevated Troponin  History:        Patient has prior history of Echocardiogram examinations, most                 recent 11/07/2020. Risk Factors:Hypertension, Dyslipidemia and                 Former Smoker. S/p TAVR (37mm Sapien).  Sonographer:    Alvino Chapel RCS Referring Phys: [id number] RONDELL A SMITH  Sonographer Comments: Technically difficult study. Patient just had left hip surgery 12/04/20. Could not turn or position patient. IMPRESSIONS  1. Left ventricular ejection fraction, by estimation, is 60 to 65%. The left ventricle has normal function. The left ventricle has no regional wall motion abnormalities. Left ventricular diastolic parameters are consistent with Grade I diastolic dysfunction (impaired relaxation).  2. Right ventricular systolic function is normal. The right ventricular size is normal. Tricuspid regurgitation signal is inadequate for assessing PA pressure.  3. The mitral valve is normal in structure. No evidence of mitral valve regurgitation. No evidence of mitral stenosis.  4. The aortic valve was not well visualized. Aortic valve regurgitation is not visualized. Mild aortic valve stenosis. Aortic valve area, by VTI measures 1.89 cm. Aortic valve mean gradient measures 17.5 mmHg. Aortic valve Vmax measures 2.56 m/s.  5. The inferior vena cava is normal in size with greater than 50% respiratory variability, suggesting right atrial pressure of 3 mmHg. FINDINGS  Left Ventricle: Left ventricular ejection fraction, by estimation, is 60 to 65%. The left ventricle has normal function. The left ventricle has no regional wall motion abnormalities. Definity contrast agent was given IV to delineate the left ventricular  endocardial borders. The left ventricular internal cavity size was normal in size. There is no left ventricular hypertrophy. Left ventricular diastolic parameters are consistent with Grade I diastolic dysfunction (impaired relaxation). Right Ventricle: The right ventricular size is  normal. No increase in right ventricular wall thickness. Right ventricular systolic function is normal. Tricuspid regurgitation signal is inadequate for assessing PA pressure. Left Atrium: Left atrial size was not well visualized. Right Atrium: Right atrial size was not well visualized. Pericardium: There is no evidence of pericardial effusion. Mitral Valve: The mitral valve is normal in structure. Mild mitral annular calcification. No evidence of mitral valve regurgitation. No evidence of mitral valve stenosis. Tricuspid Valve: The tricuspid valve is normal in structure. Tricuspid valve regurgitation is not demonstrated. No evidence of tricuspid stenosis. Aortic Valve: The aortic valve was not well visualized. Aortic valve regurgitation is not visualized. Mild aortic stenosis is present. Aortic valve mean gradient measures 17.5 mmHg. Aortic valve peak gradient measures 26.2 mmHg. Aortic valve area, by VTI  measures 1.89 cm. Pulmonic Valve: The pulmonic valve was normal in structure. Pulmonic valve regurgitation is not visualized. No evidence of pulmonic stenosis. Aorta: The aortic root is normal in size and structure. Venous: The inferior vena cava is normal in size with greater than 50% respiratory variability, suggesting right atrial pressure of 3 mmHg. IAS/Shunts: The interatrial septum appears to be lipomatous. No atrial level shunt detected by color flow Doppler.  LEFT VENTRICLE PLAX 2D LVIDd:         4.30 cm   Diastology LVIDs:         2.20 cm   LV e' medial:    4.79 cm/s LVOT diam:     1.80 cm   LV E/e' medial:  16.2 LV SV:         86        LV e' lateral:   10.90 cm/s LV SV Index:   45        LV E/e' lateral: 7.1 LVOT Area:     2.54 cm  RIGHT VENTRICLE RV S prime:     26.40 cm/s TAPSE (M-mode): 2.0 cm LEFT ATRIUM             Index LA Biplane Vol: 92.9 ml 48.25 ml/m  AORTIC VALVE AV Area (Vmax):    1.64 cm AV Area (Vmean):   1.47 cm AV Area (VTI):     1.89 cm AV Vmax:           256.00 cm/s AV Vmean:           202.000 cm/s AV VTI:            0.453 m AV Peak Grad:      26.2 mmHg AV Mean Grad:      17.5 mmHg LVOT Vmax:         165.00 cm/s LVOT Vmean:        117.000 cm/s LVOT VTI:          0.337 m LVOT/AV VTI ratio: 0.74  AORTA Ao Root diam: 3.40 cm MITRAL VALVE MV Area (PHT): 2.69 cm     SHUNTS MV Decel Time: 282 msec     Systemic VTI:  0.34 m MV E velocity: 77.70 cm/s   Systemic Diam: 1.80 cm MV A velocity: 133.00 cm/s MV E/A ratio:  0.58 Fransico Him MD Electronically signed by Fransico Him MD Signature Date/Time: 12/05/2020/8:17:05 AM    Final    DG HIP UNILAT WITH PELVIS 2-3 VIEWS LEFT  Result Date: 12/04/2020 CLINICAL DATA:  LEFT hip fracture. EXAM: DG HIP (WITH OR WITHOUT PELVIS) 2-3V LEFT COMPARISON:  Radiograph 12/03/2020 FINDINGS: Four spot intraoperative radiographs demonstrate LEFT hip total arthroplasty for repair of LEFT femoral neck fracture. IMPRESSION: LEFT hip arthroplasty without complication. Electronically Signed   By: Suzy Bouchard M.D.   On: 12/04/2020 11:11    Cardiac Studies   Echo 12/04/20 IMPRESSIONS     1. Left ventricular ejection fraction, by estimation, is 60 to 65%. The  left ventricle has normal function. The left ventricle has no regional  wall motion abnormalities. Left ventricular diastolic parameters are  consistent with Grade I diastolic  dysfunction (impaired relaxation).   2. Right ventricular systolic function is normal. The right ventricular  size is normal. Tricuspid regurgitation signal is inadequate for assessing  PA pressure.   3. The mitral valve is normal in structure. No evidence of mitral valve  regurgitation. No evidence of mitral stenosis.   4. The aortic valve was not well visualized. Aortic valve regurgitation  is not visualized. Mild aortic valve stenosis. Aortic valve area, by VTI  measures 1.89 cm. Aortic valve mean gradient measures 17.5 mmHg. Aortic  valve Vmax measures 2.56 m/s.   5. The inferior vena cava is normal in size with  greater than 50%  respiratory variability, suggesting right atrial pressure of 3 mmHg.   FINDINGS   Left Ventricle: Left ventricular ejection fraction,  by estimation, is 60  to 65%. The left ventricle has normal function. The left ventricle has no  regional wall motion abnormalities. Definity contrast agent was given IV  to delineate the left ventricular   endocardial borders. The left ventricular internal cavity size was normal  in size. There is no left ventricular hypertrophy. Left ventricular  diastolic parameters are consistent with Grade I diastolic dysfunction  (impaired relaxation).   Right Ventricle: The right ventricular size is normal. No increase in  right ventricular wall thickness. Right ventricular systolic function is  normal. Tricuspid regurgitation signal is inadequate for assessing PA  pressure.   Left Atrium: Left atrial size was not well visualized.   Right Atrium: Right atrial size was not well visualized.   Pericardium: There is no evidence of pericardial effusion.   Mitral Valve: The mitral valve is normal in structure. Mild mitral annular  calcification. No evidence of mitral valve regurgitation. No evidence of  mitral valve stenosis.   Tricuspid Valve: The tricuspid valve is normal in structure. Tricuspid  valve regurgitation is not demonstrated. No evidence of tricuspid  stenosis.   Aortic Valve: The aortic valve was not well visualized. Aortic valve  regurgitation is not visualized. Mild aortic stenosis is present. Aortic  valve mean gradient measures 17.5 mmHg. Aortic valve peak gradient  measures 26.2 mmHg. Aortic valve area, by VTI   measures 1.89 cm.   Pulmonic Valve: The pulmonic valve was normal in structure. Pulmonic valve  regurgitation is not visualized. No evidence of pulmonic stenosis.   Aorta: The aortic root is normal in size and structure.   Venous: The inferior vena cava is normal in size with greater than 50%  respiratory  variability, suggesting right atrial pressure of 3 mmHg.   IAS/Shunts: The interatrial septum appears to be lipomatous. No atrial  level shunt detected by color flow Doppler.       Patient Profile     80 y.o. 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   Assessment & Plan    Elevated troponin 33, 111, 212, 347  EKG with mild lateral ST depression, echo with no changes in EF and no RWMA.  G1DD.  RV normal.   --most likely demand ischemia with known 100% RCA stenosis, 60% LCX stenosis and stent to LAD also with RPDA filled by collaterals from distal LAD.   --tolerated surgery --Continue ASA, plavix, recent stent 03/2020  CAD/TAVR hx --echo post op stable and stable aortic valve though not well visualized. Mild aortic stenosis is present. Aortic valve mean gradient measures 17.5 mmHg (stable from prior echo). Aortic valve peak gradient measures 26.2 mmHg. Aortic valve area, by VTI  measures 1.89 cm.  --continue home meds coreg, lasix amlodipine, zetia,   imdur and lisinopril held due to soft BP--though improving would resume today   COPD with hypoxia per IM  Post op anemia per IM     For questions or updates, please contact [facility] Please consult [email or web address] for contact info under   [place] will sign off.   Medication Recommendations:  Resume home meds Other recommendations (labs, testing, etc):  None Follow up as an outpatient:  Will schedule       Signed, Cecilie Kicks, NP  12/05/2020, 10:39 AM       Patient seen and examined.  Agree with above documentation.  On exam, patient is alert and oriented, regular rate and rhythm, 2/6 systolic murmur, lungs CTAB, no LE edema or JVD.  Doing well post surgery.  Echo shows no wall motion abnormality, suspect elevated troponin represents demand ischemia.  Would resume home meds.  We will sign off at this time.  Donato Heinz, MD

## 2020-12-06 ENCOUNTER — Telehealth (HOSPITAL_COMMUNITY): Payer: Self-pay

## 2020-12-06 ENCOUNTER — Other Ambulatory Visit (HOSPITAL_COMMUNITY): Payer: Medicare Other

## 2020-12-06 ENCOUNTER — Encounter (HOSPITAL_COMMUNITY): Payer: Self-pay | Admitting: Orthopedic Surgery

## 2020-12-06 DIAGNOSIS — D649 Anemia, unspecified: Secondary | ICD-10-CM

## 2020-12-06 DIAGNOSIS — D519 Vitamin B12 deficiency anemia, unspecified: Secondary | ICD-10-CM

## 2020-12-06 DIAGNOSIS — D62 Acute posthemorrhagic anemia: Secondary | ICD-10-CM | POA: Diagnosis not present

## 2020-12-06 DIAGNOSIS — I248 Other forms of acute ischemic heart disease: Secondary | ICD-10-CM | POA: Diagnosis not present

## 2020-12-06 LAB — CBC
HCT: 24.1 % — ABNORMAL LOW (ref 39.0–52.0)
Hemoglobin: 7.2 g/dL — ABNORMAL LOW (ref 13.0–17.0)
MCH: 24.8 pg — ABNORMAL LOW (ref 26.0–34.0)
MCHC: 29.9 g/dL — ABNORMAL LOW (ref 30.0–36.0)
MCV: 83.1 fL (ref 80.0–100.0)
Platelets: 121 10*3/uL — ABNORMAL LOW (ref 150–400)
RBC: 2.9 MIL/uL — ABNORMAL LOW (ref 4.22–5.81)
RDW: 15.9 % — ABNORMAL HIGH (ref 11.5–15.5)
WBC: 6.2 10*3/uL (ref 4.0–10.5)
nRBC: 0 % (ref 0.0–0.2)

## 2020-12-06 LAB — BASIC METABOLIC PANEL
Anion gap: 6 (ref 5–15)
BUN: 17 mg/dL (ref 8–23)
CO2: 27 mmol/L (ref 22–32)
Calcium: 7.8 mg/dL — ABNORMAL LOW (ref 8.9–10.3)
Chloride: 103 mmol/L (ref 98–111)
Creatinine, Ser: 0.66 mg/dL (ref 0.61–1.24)
GFR, Estimated: >60 mL/min (ref >60)
Glucose, Bld: 114 mg/dL — ABNORMAL HIGH (ref 70–99)
Potassium: 3.8 mmol/L (ref 3.5–5.1)
Sodium: 136 mmol/L (ref 135–145)

## 2020-12-06 LAB — MAGNESIUM: Magnesium: 2.3 mg/dL (ref 1.7–2.4)

## 2020-12-06 LAB — HEMOGLOBIN AND HEMATOCRIT, BLOOD
HCT: 29.4 % — ABNORMAL LOW (ref 39.0–52.0)
Hemoglobin: 8.8 g/dL — ABNORMAL LOW (ref 13.0–17.0)

## 2020-12-06 LAB — PREPARE RBC (CROSSMATCH)

## 2020-12-06 MED ORDER — POLYSACCHARIDE IRON COMPLEX 150 MG PO CAPS
150.0000 mg | ORAL_CAPSULE | Freq: Every day | ORAL | Status: DC
  Administered 2020-12-06 – 2020-12-08 (×3): 150 mg via ORAL
  Filled 2020-12-06 (×3): qty 1

## 2020-12-06 MED ORDER — DIPHENHYDRAMINE HCL 25 MG PO CAPS
25.0000 mg | ORAL_CAPSULE | Freq: Once | ORAL | Status: AC
  Administered 2020-12-06: 25 mg via ORAL
  Filled 2020-12-06: qty 1

## 2020-12-06 MED ORDER — SORBITOL 70 % SOLN
30.0000 mL | Freq: Once | Status: AC
  Administered 2020-12-06: 30 mL via ORAL
  Filled 2020-12-06 (×2): qty 30

## 2020-12-06 MED ORDER — ACETAMINOPHEN 325 MG PO TABS
650.0000 mg | ORAL_TABLET | Freq: Once | ORAL | Status: AC
  Administered 2020-12-06: 650 mg via ORAL
  Filled 2020-12-06: qty 2

## 2020-12-06 MED ORDER — SODIUM CHLORIDE 0.9% IV SOLUTION: Freq: Once | INTRAVENOUS | Status: AC

## 2020-12-06 NOTE — Progress Notes (Signed)
Physical Therapy Treatment Patient Details Name: Brandon [name] MRN: [id number] DOB: 04-Nov-1940 Today's Date: 12/06/2020   History of Present Illness 80 yo male admitted with L femoral neck fx s/p THA-DA 12/04/20 and L shoulder pain after fall at home.    PT Comments    Pt continues to participate well. He denied dizziness during session. Moderate pain reported. Reviewed/practiced exercises, gait training,and stair training. Encouraged pt to ambulate as tolerated at home. Will need HHPT f/u and RW for ambulation safety.     Recommendations for follow up therapy are one component of a multi-disciplinary discharge planning process, led by the attending physician.  Recommendations may be updated based on patient status, additional functional criteria and insurance authorization.  Follow Up Recommendations  Home health PT     Assistance Recommended at Discharge Frequent or constant Supervision/Assistance  Equipment Recommendations  Rolling walker (2 wheels)    Recommendations for Other Services       Precautions / Restrictions Precautions Precautions: Fall Restrictions Weight Bearing Restrictions: No LLE Weight Bearing: Weight bearing as tolerated Other Position/Activity Restrictions: WBAT     Mobility  Bed Mobility Overal bed mobility: Needs Assistance Bed Mobility: Supine to Sit     Supine to sit: Min assist Sit to supine: Min assist   General bed mobility comments: Assist for L LE 2* pain. Practiced using gait belt as leg lifter    Transfers Overall transfer level: Needs assistance Equipment used: Rolling walker (2 wheels) Transfers: Sit to/from Stand Sit to Stand: Supervision           General transfer comment: Cues for safety, technique, hand placement.    Ambulation/Gait Ambulation/Gait assistance: Min guard Gait Distance (Feet): 75 Feet Assistive device: Rolling walker (2 wheels) Gait Pattern/deviations: Step-to pattern;Decreased stride length      General Gait Details: Min guard for safety. Cues for safety. No LOB with RW use.   Stairs Stairs: Yes Stairs assistance: Min assist Stair Management: Forwards;One rail Right;Step to pattern Number of Stairs: 2 General stair comments: up and over stairs with 1 handrail + 1 HHA. instructed pt to have wife provided handheld assist or he can use a cane + rail. cues for safety, technique.   Wheelchair Mobility    Modified Rankin (Stroke Patients Only)       Balance Overall balance assessment: Needs assistance         Standing balance support: Bilateral upper extremity supported Standing balance-Leahy Scale: Fair                              Cognition Arousal/Alertness: Awake/alert Behavior During Therapy: WFL for tasks assessed/performed Overall Cognitive Status: Within Functional Limits for tasks assessed                                          Exercises Total Joint Exercises Ankle Circles/Pumps: AROM;Both;10 reps Quad Sets: AROM;Both;10 reps Heel Slides: AAROM;Left;10 reps Hip ABduction/ADduction: AAROM;Left;10 reps    General Comments        Pertinent Vitals/Pain Pain Assessment: 0-10 Pain Score: 7  Faces Pain Scale: Hurts even more Pain Location: L hip/thigh Pain Descriptors / Indicators: Discomfort;Sore;Guarding Pain Intervention(s): Limited activity within patient's tolerance;Monitored during session;Repositioned    Home Living  Prior Function            PT Goals (current goals can now be found in the care plan section)      Frequency    Min 3X/week      PT Plan Current plan remains appropriate    Co-evaluation              AM-PAC PT "6 Clicks" Mobility   Outcome Measure  Help needed turning from your back to your side while in a flat bed without using bedrails?: A Little Help needed moving from lying on your back to sitting on the side of a flat bed without using  bedrails?: A Little Help needed moving to and from a bed to a chair (including a wheelchair)?: A Little Help needed standing up from a chair using your arms (e.g., wheelchair or bedside chair)?: A Little Help needed to walk in hospital room?: A Little Help needed climbing 3-5 steps with a railing? : A Little 6 Click Score: 18    End of Session Equipment Utilized During Treatment: Gait belt Activity Tolerance: Patient tolerated treatment well Patient left: in bed;with call bell/phone within reach;with bed alarm set   PT Visit Diagnosis: Difficulty in walking, not elsewhere classified (R26.2);Other abnormalities of gait and mobility (R26.89)     Time: 1016-1040 PT Time Calculation (min) (ACUTE ONLY): 24 min  Charges:  $Gait Training: 8-22 mins $Therapeutic Exercise: 8-22 mins                         Doreatha Massed, PT Acute Rehabilitation  Office: [phone] Pager: [phone]

## 2020-12-06 NOTE — Progress Notes (Signed)
   12/06/20 1235  Assess: MEWS Score  Temp 98.2 F (36.8 C)  BP (!) 76/49  Pulse Rate 67  Resp 14  SpO2 92 %  O2 Device Room Air  Assess: MEWS Score  MEWS Temp 0  MEWS Systolic 2  MEWS Pulse 0  MEWS RR 0  MEWS LOC 0  MEWS Score 2  MEWS Score Color Yellow  Assess: if the MEWS score is Yellow or Red  Were vital signs taken at a resting state? Yes  Focused Assessment No change from prior assessment  Does the patient meet 2 or more of the SIRS criteria? No  MEWS guidelines implemented *See Row Information* No, vital signs rechecked  Treat  MEWS Interventions Administered scheduled meds/treatments  Pain Scale 0-10  Pain Score 3  Pain Type Chronic pain;Surgical pain  Pain Location Hip  Pain Orientation Left  Pain Descriptors / Indicators Aching  Pain Frequency Intermittent  Pain Onset On-going  Patients Stated Pain Goal 4  Pain Intervention(s) Repositioned  Multiple Pain Sites No  Complains of Other (Comment) (no complain)  Interventions Reposition  Patients response to intervention Effective  Take Vital Signs  Increase Vital Sign Frequency  Yellow: Q 2hr X 2 then Q 4hr X 2, if remains yellow, continue Q 4hrs  Escalate  MEWS: Escalate Yellow: discuss with charge nurse/RN and consider discussing with provider and RRT  Notify: Charge Nurse/RN  Name of Charge Nurse/RN Software engineer  Date Charge Nurse/RN Notified 12/06/20  Time Charge Nurse/RN Notified 51  Notify: Provider  Provider Name/Title Grandville Silos  Date Provider Notified 12/06/20  Time Provider Notified 1240  Notification Type Page  Notification Reason Other (Comment) (low BP)  Provider response See new orders (blood transfusion ordered)  Date of Provider Response 12/06/20  Time of Provider Response 1255  Assess: SIRS CRITERIA  SIRS Temperature  0  SIRS Pulse 0  SIRS Respirations  0  SIRS WBC 0  SIRS Score Sum  0

## 2020-12-06 NOTE — Telephone Encounter (Signed)
Patient fell and is in the hospital getting a hip replacement so lab appointment had to be canceled.

## 2020-12-06 NOTE — Progress Notes (Signed)
Occupational Therapy Treatment Patient Details Name: Brandon [name] MRN: [id number] DOB: 1940/11/06 Today's Date: 12/06/2020   History of present illness 80 yo male admitted with L femoral neck fx s/p THA-DA 12/04/20 and L shoulder pain after fall at home.   OT comments  Patient was educated on AE for LB dressing with patient able to complete LB dressing tasks/simulations with SUP with RW. Patient reported he would look into getting total hip kit. Patient was also educated on importance of keeping BUE on RW and walker tray/basket benefits. Patient verbalized understanding. Patient's discharge plan remains appropriate at this time. OT will continue to follow acutely.     Recommendations for follow up therapy are one component of a multi-disciplinary discharge planning process, led by the attending physician.  Recommendations may be updated based on patient status, additional functional criteria and insurance authorization.    Follow Up Recommendations  Home health OT    Assistance Recommended at Discharge Frequent or constant Supervision/Assistance  Equipment Recommendations  Other (comment) (total hip kit)    Recommendations for Other Services      Precautions / Restrictions Precautions Precautions: Fall Restrictions Weight Bearing Restrictions: Yes LLE Weight Bearing: Weight bearing as tolerated Other Position/Activity Restrictions: WBAT       Mobility Bed Mobility Overal bed mobility: Needs Assistance Bed Mobility: Sit to Supine     Supine to sit: Supervision          Transfers Overall transfer level: Needs assistance Equipment used: Rolling walker (2 wheels) Transfers: Sit to/from Stand Sit to Stand: Supervision           General transfer comment: Cues for safety, technique, hand placement.     Balance Overall balance assessment: Mild deficits observed, not formally tested                                         ADL either performed or  assessed with clinical judgement   ADL Overall ADL's : Needs assistance/impaired                     Lower Body Dressing: Sit to/from stand;With adaptive equipment;Set up Lower Body Dressing Details (indicate cue type and reason): patient was able to use AE to don/doff socks and use reacher to simulate LB dressing tasks. patient to look into getting total hip kit. patient educated on importance of completing tasks for self instead. patient verbalized understanding.               General ADL Comments: Patient was able to transfer from edge of bed to recliner in room with set up of RW. patient was educated on importance of using walker all times at home. patient verbalized understanding. patietn was educated on walker trays/baskets and compensatory strategeis to carry items while using RW. patient verbalized understanding.     Vision Patient Visual Report: No change from baseline     Perception     Praxis      Cognition Arousal/Alertness: Awake/alert Behavior During Therapy: WFL for tasks assessed/performed Overall Cognitive Status: Within Functional Limits for tasks assessed                                            Exercises     Shoulder Instructions  General Comments      Pertinent Vitals/ Pain       Pain Assessment: Faces Faces Pain Scale: Hurts even more Pain Location: L hip/thigh Pain Descriptors / Indicators: Discomfort;Sore;Guarding Pain Intervention(s): Limited activity within patient's tolerance;Monitored during session;Repositioned  Home Living                                          Prior Functioning/Environment              Frequency  Min 2X/week        Progress Toward Goals  OT Goals(current goals can now be found in the care plan section)  Progress towards OT goals: Progressing toward goals  Acute Rehab OT Goals Patient Stated Goal: to go home with wife today OT Goal Formulation: With  patient Time For Goal Achievement: 12/19/20 Potential to Achieve Goals: Good  Plan Discharge plan remains appropriate    Co-evaluation                 AM-PAC OT "6 Clicks" Daily Activity     Outcome Measure   Help from another person eating meals?: None Help from another person taking care of personal grooming?: None Help from another person toileting, which includes using toliet, bedpan, or urinal?: A Little Help from another person bathing (including washing, rinsing, drying)?: A Little Help from another person to put on and taking off regular upper body clothing?: None Help from another person to put on and taking off regular lower body clothing?: A Little (none with AE) 6 Click Score: 21    End of Session Equipment Utilized During Treatment: Rolling walker (2 wheels)  OT Visit Diagnosis: Unsteadiness on feet (R26.81);Muscle weakness (generalized) (M62.81);Pain   Activity Tolerance Patient tolerated treatment well   Patient Left in chair;with call bell/phone within reach;with chair alarm set   Nurse Communication          Time: [phone] OT Time Calculation (min): 26 min  Charges: OT General Charges $OT Visit: 1 Visit OT Treatments $Self Care/Home Management : 23-37 mins  Jackelyn Poling OTR/L, [facility] Department Office# [phone] Pager# [phone]   [place] 12/06/2020, 10:04 AM

## 2020-12-06 NOTE — Progress Notes (Addendum)
PROGRESS NOTE    Brandon [name]  UJW:[id number] DOB: 07-30-1940 DOA: 12/03/2020 PCP: Haydee Salter, MD    Chief Complaint  Patient presents with   Fall   Back Pain    Brief Narrative:  HPI per Dr. [name] is a 80 y.o. 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  He had cut off the lights last night around midnight when he tripped over the dog bed.  He denies any loss of consciousness or trauma to his head, but had severe pain out of his left hip.  He also hit his left shoulder which he states is difficult for him to raise his arm up due to pain.  He was unable to ambulate, bear weight, or move on the affected leg without being in extreme pain. Pain improve when he doesn't move the leg.  Patient denies having any chest pain, nausea, vomiting, abdominal pain, leg swelling, recent sick contacts.  He chronically has a cough with intermittent wheezing which he relates to his prior history of tonsillar squamous cell carcinoma treatment and difficulty clearing secretions.  Other associated symptoms included chronic pain for which he is on oxycodone followed by pain management. Notes that he is scheduled to have stenting of his abdominal aneurysm next month with vascular surgery.   En route with EMS patient was given fentanyl 100 mcg IV.   ED Course: Upon admission into the emergency department patient was seen to have a temperature of 99.8 F, respirations 16-27, blood pressures maintained, and O2 saturations noted to be as low as 85% with improvement on 2 L  nasal cannula oxygen.  Labs significant for WBC 11.1, hemoglobin 9.7, and high-sensitivity troponin 33.  CT of the chest abdomen pelvis noted a left femoral neck fracture with varus angulation.  Orthopedics was formally consulted and recommended transferring patient to [facility] surgery on 12/04/2020  Assessment & Plan:   Principal Problem:   Closed displaced fracture of left femoral neck (HCC) Active Problems:   Coronary artery disease   Hypertension   Aneurysm of infrarenal abdominal aorta   Hypochromic anemia   Anxiety and depression   Chronic pain syndrome   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HCC)   History of transcatheter aortic valve replacement (TAVR)   Chronic obstructive pulmonary disease ([place])   Fall at home, initial encounter   Acute on chronic respiratory failure with hypoxia (HCC)   Elevated troponin   Demand ischemia (HCC)   Acute blood loss anemia  #1 displaced left femoral neck fracture secondary to mechanical fall -Noted to have cut out the lights the night prior to admission stating that he had tripped over the dog bed causing him to fall with continued severe left hip pain. -Imaging done on admission significant for displaced fracture of the left femur. -Patient seen in consultation by orthopedics, transferred to [facility] and underwent left total hip arthroplasty per orthopedics, Dr. Lyla Glassing this morning 12/04/2020. -Patient was seen for preop clearance by cardiology and patient noted to be high risk for surgery. -PT/OT. -Pain management and DVT prophylaxis per  orthopedics.  2.  Leukocytosis -Likely reactive secondary to problem #1. -Patient afebrile. -CT chest negative for any acute infiltrates. -Urinalysis unremarkable. -Leukocytosis has trended down. -No need for antibiotics.  3.  Acute respiratory failure with hypoxia/COPD -Patient noted to have O2 sats as low as 85% on room air in the ED. -CT chest done negative for PE, negative for  any acute abnormalities. -Patient noted to have some mild wheezing on admission but felt likely to be more upper airway. -Patient with no ongoing tobacco use however has a 60-pack-year history and felt hypoxia may have been iatrogenic as patient had received 100 MCG's of fentanyl on route with EMS and an additional 50 MCG's in the ED. -Patient currently 93% on 2 L nasal cannula. -Continue Pulmicort, scheduled duo nebs. -Supportive care.  4.  Elevated troponin/CAD -Patient noted to have a elevated high-sensitivity troponin however remained asymptomatic. -Patient with complex/complicated cardiac history noted to have RCA with robust collaterals from LAD and 95% LAD stenosis status post PCI/arthrectomy on 04/27/2020. -EKG done with minimal ST depression in the lateral leads which was unchanged. -Elevated troponins likely secondary to demand ischemia. -2D echo obtained with a EF of 60 to 65% with grade 1 diastolic dysfunction on 92/10/2444. -Continue aspirin. -Patient seen in consultation by cardiology who feel elevated troponin likely secondary to demand ischemia and severe CAD. -Per cardiology no further work-up needed at this time. -Continue home cardiac regimen of Coreg, Zetia, Lasix, Imdur. -Due to hemoglobin of 7.2 and patient noted to have greater than 6 months post stent cardiology recommended discontinuation of Plavix and recommended transfusion to keep hemoglobin > 8. -Due to borderline blood pressure lisinopril and Norvasc held per cardiology. -Cardiology following.  5.  Anemia/vitamin B12 deficiency. -Baseline hemoglobin approximately 9-11. -No overt bleeding. -Hemoglobin at 7.2 from 7.9 from 8.6 from 10.5. -Anemia panel with iron of 22, TIBC of 301, ferritin of 77, folate of 8.6, vitamin B12 levels of 151 -Vitamin B12 1000 MCG's subcu daily x7 days or while in the hospital and could transition to oral vitamin B12 supplementation on discharge. -Due to complicated cardiac history we  will transfuse 1 unit packed red blood cells to keep hemoglobin above 8. -Transfusion threshold hemoglobin < 8. -Place on oral iron supplementation -Plavix discontinued after discussion with cardiology. -Follow H&H.  6.  Left shoulder pain -Secondary to mechanical fall. -Imaging studies done with no acute fracture. -Per orthopedics.  7.  Hypertension -Continue Coreg, Lasix, Imdur.   -Due to borderline blood pressure lisinopril and Norvasc held per cardiology recommendations.  -Follow.  8.  Polymyalgia rheumatica on chronic steroids -Continue home regimen prednisone.   9.  Chronic pain -Patient being followed in the outpatient setting by pain management. -Oxycodone as needed. -Further pain management due to acute fracture per orthopedics.  10.  Depression/anxiety -Celexa.  11.  AAA -Patient with known 4.1 cm AAA for which she supposed to undergo endovascular stenting in December with vascular surgery. -Outpatient follow-up with vascular surgery.  12.  Hyperlipidemia -Zetia, statin.    13.  Prior history of tobacco abuse -Per admitting physician patient noted to have had a 60-pack-year however no ongoing tobacco use since being diagnosed with cancer 3 years ago.  14.  GERD with history of esophageal stricture -Continue PPI   DVT prophylaxis: Per orthopedics. Code Status: Full Family Communication: Updated patient.  No family at bedside. Disposition:   Status is: Inpatient  Remains inpatient appropriate because: Ongoing inpatient care.  Unsafe disposition at this time.  Status post left total hip arthroplasty today 12/04/2020 needing further evaluation by PT/OT and orthopedics.  Not stable for discharge.       Consultants:  Cardiology: Dr. Humphrey Rolls 12/03/2020 Orthopedics: Dr. Mable Fill 12/03/2020  Procedures:  CT head 12/03/2020 CT chest abdomen and pelvis 12/03/2020 Plain films of the pelvis 12/04/2020 Left total hip arthroplasty per Dr. Lyla Glassing  12/04/2020 Transfuse 1 unit packed red blood cells 12/06/2020  Antimicrobials:  None   Subjective: Sitting up in bed.  No chest pain.  No shortness of breath.  Some abdominal discomfort.  No bowel movement in 4 days.  Overall feeling well.  No significant left hip pain.  Sitting up in bed.  Feeling better.  No chest pain.  Patient denies any overt bleeding.  Objective: Vitals:   12/06/20 1145 12/06/20 1235 12/06/20 1241 12/06/20 1243  BP:  (!) 76/49 (!) 94/53 (!) 100/48  Pulse:  67    Resp:  14    Temp:  98.2 F (36.8 C)    TempSrc:  Oral    SpO2: 92% 92%    Weight:      Height:        Intake/Output Summary (Last 24 hours) at 12/06/2020 1308 Last data filed at 12/06/2020 0857 Gross per 24 hour  Intake 360 ml  Output 875 ml  Net -515 ml    Filed Weights   12/03/20 1932 12/03/20 2038  Weight: 79.4 kg 81.9 kg    Examination:  General exam: NAD. Respiratory system: CTA B.  No wheezes, no crackles, no rhonchi.  Normal respiratory effort.  Speaking in full sentences.  Cardiovascular system: RRR no murmurs rubs or gallops.  No JVD.  No lower extremity edema.  Gastrointestinal system: Abdomen is soft, nontender, nondistended, positive bowel sounds.  No rebound.  No guarding. Central nervous system: Alert and oriented. No focal neurological deficits. Extremities: Left hip with dressing in place.  Skin: No rashes, lesions or ulcers Psychiatry: Judgement and insight appear normal. Mood & affect appropriate.     Data Reviewed: I have personally reviewed following labs and imaging studies  CBC: Recent Labs  Lab 12/03/20 0720 12/03/20 0801 12/04/20 0431 12/04/20 1038 12/05/20 0445 12/05/20 1458 12/06/20 0430  WBC 11.1*  --  5.7  --  8.0  --  6.2  HGB 9.7*   < > 9.6* 10.5* 8.6* 7.9* 7.2*  HCT 32.6*   < > 33.6* 31.0* 27.8* 26.2* 24.1*  MCV 80.9  --  82.2  --  80.3  --  83.1  PLT 159  --  136*  --  122*  --  121*   < > = values in this interval not displayed.      Basic Metabolic Panel: Recent Labs  Lab 12/03/20 0720 12/03/20 0801 12/04/20 0431 12/04/20 1038 12/05/20 0445 12/06/20 0430  NA 135 135 138 137 136 136  K 3.6 3.6 3.8 4.5 3.8 3.8  CL 98 97* 100  --  102 103  CO2 29  --  29  --  28 27  GLUCOSE 129* 127* 95  --  166* 114*  BUN 17 17 17   --  21 17  CREATININE 0.90 0.80 0.78  --  0.76 0.66  CALCIUM 8.4*  --  8.5*  --  8.2* 7.8*  MG  --   --   --   --  1.7 2.3     GFR: Estimated Creatinine Clearance: 77.3 mL/min (by C-G formula based on SCr of 0.66 mg/dL).  Liver Function  Tests: Recent Labs  Lab 12/03/20 0720  AST 19  ALT 14  ALKPHOS 99  BILITOT 1.0  PROT 6.0*  ALBUMIN 3.1*     CBG: Recent Labs  Lab 12/04/20 1423  GLUCAP 178*      Recent Results (from the past 240 hour(s))  Resp Panel by RT-PCR (Flu A&B, Covid) Nasopharyngeal Swab     Status: None   Collection Time: 12/03/20  7:20 AM   Specimen: Nasopharyngeal Swab; Nasopharyngeal(NP) swabs in vial transport medium  Result Value Ref Range Status   SARS Coronavirus 2 by RT PCR NEGATIVE NEGATIVE Final    Comment: (NOTE) SARS-CoV-2 target nucleic acids are NOT DETECTED.  The SARS-CoV-2 RNA is generally detectable in upper respiratory specimens during the acute phase of infection. The lowest concentration of SARS-CoV-2 viral copies this assay can detect is 138 copies/mL. A negative result does not preclude SARS-Cov-2 infection and should not be used as the sole basis for treatment or other patient management decisions. A negative result may occur with  improper specimen collection/handling, submission of specimen other than nasopharyngeal swab, presence of viral mutation(s) within the areas targeted by this assay, and inadequate number of viral copies(<138 copies/mL). A negative result must be combined with clinical observations, patient history, and epidemiological information. The expected result is Negative.  Fact Sheet for Patients:   EntrepreneurPulse.com.au  Fact Sheet for Healthcare Providers:  IncredibleEmployment.be  This test is no t yet approved or cleared by the [place] FDA and  has been authorized for detection and/or diagnosis of SARS-CoV-2 by FDA under an Emergency Use Authorization (EUA). This EUA will remain  in effect (meaning this test can be used) for the duration of the COVID-19 declaration under Section 564(b)(1) of the Act, 21 U.S.C.section 360bbb-3(b)(1), unless the authorization is terminated  or revoked sooner.       Influenza A by PCR NEGATIVE NEGATIVE Final   Influenza B by PCR NEGATIVE NEGATIVE Final    Comment: (NOTE) The Xpert Xpress SARS-CoV-2/FLU/RSV plus assay is intended as an aid in the diagnosis of influenza from Nasopharyngeal swab specimens and should not be used as a sole basis for treatment. Nasal washings and aspirates are unacceptable for Xpert Xpress SARS-CoV-2/FLU/RSV testing.  Fact Sheet for Patients: EntrepreneurPulse.com.au  Fact Sheet for Healthcare Providers: IncredibleEmployment.be  This test is not yet approved or cleared by the [place] FDA and has been authorized for detection and/or diagnosis of SARS-CoV-2 by FDA under an Emergency Use Authorization (EUA). This EUA will remain in effect (meaning this test can be used) for the duration of the COVID-19 declaration under Section 564(b)(1) of the Act, 21 U.S.C. section 360bbb-3(b)(1), unless the authorization is terminated or revoked.  Performed at [facility], [place] [address], [place], [place] [address]           Radiology Studies: ECHOCARDIOGRAM COMPLETE  Result Date: 12/05/2020    ECHOCARDIOGRAM REPORT   Patient Name:   Brandon [name] Date of Exam: 12/04/2020 Medical Rec #:  [id number]    Height:       66.5 in Accession #:    [id number]   Weight:       180.6 lb Date of Birth:  [birth year]    BSA:          1.925 m  Patient Age:    54 years     BP:           94/67 mmHg Patient Gender: M  HR:           84 bpm. Exam Location:  Inpatient Procedure: 2D Echo, Cardiac Doppler and Color Doppler Indications:    Elevated Troponin  History:        Patient has prior history of Echocardiogram examinations, most                 recent 11/07/2020. Risk Factors:Hypertension, Dyslipidemia and                 Former Smoker. S/p TAVR (30mm Sapien).  Sonographer:    Alvino Chapel RCS Referring Phys: [id number] RONDELL A SMITH  Sonographer Comments: Technically difficult study. Patient just had left hip surgery 12/04/20. Could not turn or position patient. IMPRESSIONS  1. Left ventricular ejection fraction, by estimation, is 60 to 65%. The left ventricle has normal function. The left ventricle has no regional wall motion abnormalities. Left ventricular diastolic parameters are consistent with Grade I diastolic dysfunction (impaired relaxation).  2. Right ventricular systolic function is normal. The right ventricular size is normal. Tricuspid regurgitation signal is inadequate for assessing PA pressure.  3. The mitral valve is normal in structure. No evidence of mitral valve regurgitation. No evidence of mitral stenosis.  4. The aortic valve was not well visualized. Aortic valve regurgitation is not visualized. Mild aortic valve stenosis. Aortic valve area, by VTI measures 1.89 cm. Aortic valve mean gradient measures 17.5 mmHg. Aortic valve Vmax measures 2.56 m/s.  5. The inferior vena cava is normal in size with greater than 50% respiratory variability, suggesting right atrial pressure of 3 mmHg. FINDINGS  Left Ventricle: Left ventricular ejection fraction, by estimation, is 60 to 65%. The left ventricle has normal function. The left ventricle has no regional wall motion abnormalities. Definity contrast agent was given IV to delineate the left ventricular  endocardial borders. The left ventricular internal cavity size was normal in size.  There is no left ventricular hypertrophy. Left ventricular diastolic parameters are consistent with Grade I diastolic dysfunction (impaired relaxation). Right Ventricle: The right ventricular size is normal. No increase in right ventricular wall thickness. Right ventricular systolic function is normal. Tricuspid regurgitation signal is inadequate for assessing PA pressure. Left Atrium: Left atrial size was not well visualized. Right Atrium: Right atrial size was not well visualized. Pericardium: There is no evidence of pericardial effusion. Mitral Valve: The mitral valve is normal in structure. Mild mitral annular calcification. No evidence of mitral valve regurgitation. No evidence of mitral valve stenosis. Tricuspid Valve: The tricuspid valve is normal in structure. Tricuspid valve regurgitation is not demonstrated. No evidence of tricuspid stenosis. Aortic Valve: The aortic valve was not well visualized. Aortic valve regurgitation is not visualized. Mild aortic stenosis is present. Aortic valve mean gradient measures 17.5 mmHg. Aortic valve peak gradient measures 26.2 mmHg. Aortic valve area, by VTI  measures 1.89 cm. Pulmonic Valve: The pulmonic valve was normal in structure. Pulmonic valve regurgitation is not visualized. No evidence of pulmonic stenosis. Aorta: The aortic root is normal in size and structure. Venous: The inferior vena cava is normal in size with greater than 50% respiratory variability, suggesting right atrial pressure of 3 mmHg. IAS/Shunts: The interatrial septum appears to be lipomatous. No atrial level shunt detected by color flow Doppler.  LEFT VENTRICLE PLAX 2D LVIDd:         4.30 cm   Diastology LVIDs:         2.20 cm   LV e' medial:    4.79 cm/s LVOT diam:  1.80 cm   LV E/e' medial:  16.2 LV SV:         86        LV e' lateral:   10.90 cm/s LV SV Index:   45        LV E/e' lateral: 7.1 LVOT Area:     2.54 cm  RIGHT VENTRICLE RV S prime:     26.40 cm/s TAPSE (M-mode): 2.0 cm LEFT  ATRIUM             Index LA Biplane Vol: 92.9 ml 48.25 ml/m  AORTIC VALVE AV Area (Vmax):    1.64 cm AV Area (Vmean):   1.47 cm AV Area (VTI):     1.89 cm AV Vmax:           256.00 cm/s AV Vmean:          202.000 cm/s AV VTI:            0.453 m AV Peak Grad:      26.2 mmHg AV Mean Grad:      17.5 mmHg LVOT Vmax:         165.00 cm/s LVOT Vmean:        117.000 cm/s LVOT VTI:          0.337 m LVOT/AV VTI ratio: 0.74  AORTA Ao Root diam: 3.40 cm MITRAL VALVE MV Area (PHT): 2.69 cm     SHUNTS MV Decel Time: 282 msec     Systemic VTI:  0.34 m MV E velocity: 77.70 cm/s   Systemic Diam: 1.80 cm MV A velocity: 133.00 cm/s MV E/A ratio:  0.58 Fransico Him MD Electronically signed by Fransico Him MD Signature Date/Time: 12/05/2020/8:17:05 AM    Final         Scheduled Meds:  sodium chloride   Intravenous Once   acetaminophen  650 mg Oral Once   allopurinol  200 mg Oral Daily   aspirin  81 mg Oral Daily   atorvastatin  80 mg Oral Daily   budesonide (PULMICORT) nebulizer solution  0.5 mg Nebulization BID   carvedilol  12.5 mg Oral BID   citalopram  20 mg Oral Daily   cyanocobalamin  1,000 mcg Subcutaneous Daily   diphenhydrAMINE  25 mg Oral Once   docusate sodium  100 mg Oral BID   ezetimibe  10 mg Oral Daily   furosemide  20 mg Oral Daily   gabapentin  300 mg Oral TID   guaiFENesin  1,200 mg Oral BID   ipratropium-albuterol  3 mL Nebulization BID   iron polysaccharides  150 mg Oral Daily   isosorbide mononitrate  30 mg Oral Daily   mirtazapine  30 mg Oral QHS   multivitamin with minerals  1 tablet Oral Daily   pantoprazole  40 mg Oral Daily   potassium chloride  20 mEq Oral Daily   predniSONE  4 mg Oral Q breakfast   senna  1 tablet Oral BID   senna-docusate  1 tablet Oral QHS   sorbitol  30 mL Oral Once   tamsulosin  0.4 mg Oral QHS   Continuous Infusions:  sodium chloride Stopped (12/05/20 0936)   methocarbamol (ROBAXIN) IV Stopped (12/04/20 1356)     LOS: 3 days    Time spent:  35 mins    Irine Seal, MD [facility]   To contact the attending provider between 7A-7P or the covering provider during after hours 7P-7A, please log into the web site [email or web address] and access  using universal [place] password for that web site. If you do not have the password, please call the hospital operator.  12/06/2020, 1:08 PM

## 2020-12-06 NOTE — Progress Notes (Signed)
Progress Note  Patient Name: Brandon [name] Date of Encounter: 12/06/2020  [facility] Cardiologist: Loralie Champagne, MD  [facility]  Subjective   No chest pain or dyspnea.  BP down to 76/49 today  Inpatient Medications    Scheduled Meds:  sodium chloride   Intravenous Once   acetaminophen  650 mg Oral Once   allopurinol  200 mg Oral Daily   amLODipine  10 mg Oral Daily   aspirin  81 mg Oral Daily   atorvastatin  80 mg Oral Daily   budesonide (PULMICORT) nebulizer solution  0.5 mg Nebulization BID   carvedilol  12.5 mg Oral BID   citalopram  20 mg Oral Daily   cyanocobalamin  1,000 mcg Subcutaneous Daily   diphenhydrAMINE  25 mg Oral Once   docusate sodium  100 mg Oral BID   ezetimibe  10 mg Oral Daily   furosemide  20 mg Oral Daily   gabapentin  300 mg Oral TID   guaiFENesin  1,200 mg Oral BID   ipratropium-albuterol  3 mL Nebulization BID   iron polysaccharides  150 mg Oral Daily   isosorbide mononitrate  30 mg Oral Daily   lisinopril  20 mg Oral Daily   mirtazapine  30 mg Oral QHS   multivitamin with minerals  1 tablet Oral Daily   pantoprazole  40 mg Oral Daily   potassium chloride  20 mEq Oral Daily   predniSONE  4 mg Oral Q breakfast   senna  1 tablet Oral BID   senna-docusate  1 tablet Oral QHS   tamsulosin  0.4 mg Oral QHS   Continuous Infusions:  sodium chloride Stopped (12/05/20 0936)   methocarbamol (ROBAXIN) IV Stopped (12/04/20 1356)   PRN Meds: acetaminophen, fluticasone, HYDROmorphone (DILAUDID) injection, menthol-cetylpyridinium **OR** phenol, methocarbamol **OR** methocarbamol (ROBAXIN) IV, metoCLOPramide **OR** metoCLOPramide (REGLAN) injection, ondansetron **OR** ondansetron (ZOFRAN) IV, oxyCODONE, oxyCODONE, polyethylene glycol, sodium phosphate, sorbitol   Vital Signs    Vitals:   12/06/20 0432 12/06/20 1100 12/06/20 1145 12/06/20 1235  BP: 114/71   (!) 76/49  Pulse: 73   67  Resp:  17  14  Temp: 98.7 F (37.1 C)    98.2 F (36.8 C)  TempSrc: Oral   Oral  SpO2: 97%  92% 92%  Weight:      Height:        Intake/Output Summary (Last 24 hours) at 12/06/2020 1241 Last data filed at 12/06/2020 0857 Gross per 24 hour  Intake 360 ml  Output 875 ml  Net -515 ml    Last 3 Weights 12/03/2020 12/03/2020 11/30/2020  Weight (lbs) 180 lb 8.9 oz 175 lb 182 lb 14.4 oz  Weight (kg) 81.9 kg 79.379 kg 82.963 kg      Telemetry    NSR 60-80s - Personally Reviewed  ECG    No new - Personally Reviewed  Physical Exam  GEN:  in no acute distress HEENT: normal Neck: no JVD Cardiac: RRR; 2/6 systolic murmur Respiratory:  clear to auscultation bilaterally, normal work of breathing GI: soft, nontender MS: no deformity  Skin: warm and dry, no rash Neuro:  Alert and Oriented x 3, Strength and sensation are intact Psych: euthymic mood, full affect   Labs    High Sensitivity Troponin:   Recent Labs  Lab 12/03/20 0720 12/03/20 0922 12/03/20 1338 12/03/20 1538  TROPONINIHS 33* 111* 212* 347*      Chemistry Recent Labs  Lab 12/03/20 0720 12/03/20 0801 12/04/20 0431  12/04/20 1038 12/05/20 0445 12/06/20 0430  NA 135   < > 138 137 136 136  K 3.6   < > 3.8 4.5 3.8 3.8  CL 98   < > 100  --  102 103  CO2 29  --  29  --  28 27  GLUCOSE 129*   < > 95  --  166* 114*  BUN 17   < > 17  --  21 17  CREATININE 0.90   < > 0.78  --  0.76 0.66  CALCIUM 8.4*  --  8.5*  --  8.2* 7.8*  MG  --   --   --   --  1.7 2.3  PROT 6.0*  --   --   --   --   --   ALBUMIN 3.1*  --   --   --   --   --   AST 19  --   --   --   --   --   ALT 14  --   --   --   --   --   ALKPHOS 99  --   --   --   --   --   BILITOT 1.0  --   --   --   --   --   GFRNONAA >60  --  >60  --  >60 >60  ANIONGAP 8  --  9  --  6 6   < > = values in this interval not displayed.     Lipids No results for input(s): CHOL, TRIG, HDL, LABVLDL, LDLCALC, CHOLHDL in the last 168 hours.  Hematology Recent Labs  Lab 12/04/20 0431 12/04/20 1038  12/05/20 0445 12/05/20 1458 12/06/20 0430  WBC 5.7  --  8.0  --  6.2  RBC 4.09*  --  3.46*  --  2.90*  HGB 9.6*   < > 8.6* 7.9* 7.2*  HCT 33.6*   < > 27.8* 26.2* 24.1*  MCV 82.2  --  80.3  --  83.1  MCH 23.5*  --  24.9*  --  24.8*  MCHC 28.6*  --  30.9  --  29.9*  RDW 15.7*  --  15.5  --  15.9*  PLT 136*  --  122*  --  121*   < > = values in this interval not displayed.    Thyroid No results for input(s): TSH, FREET4 in the last 168 hours.  BNPNo results for input(s): BNP, PROBNP in the last 168 hours.  DDimer No results for input(s): DDIMER in the last 168 hours.   Radiology    ECHOCARDIOGRAM COMPLETE  Result Date: 12/05/2020    ECHOCARDIOGRAM REPORT   Patient Name:   KIRKLAND FIGG Date of Exam: 12/04/2020 Medical Rec #:  [id number]    Height:       66.5 in Accession #:    [id number]   Weight:       180.6 lb Date of Birth:  02-19-1940    BSA:          1.925 m Patient Age:    46 years     BP:           94/67 mmHg Patient Gender: M            HR:           84 bpm. Exam Location:  Inpatient Procedure: 2D Echo, Cardiac Doppler and Color Doppler Indications:    Elevated Troponin  History:        Patient has prior history of Echocardiogram examinations, most                 recent 11/07/2020. Risk Factors:Hypertension, Dyslipidemia and                 Former Smoker. S/p TAVR (12mm Sapien).  Sonographer:    Alvino Chapel RCS Referring Phys: [id number] RONDELL A SMITH  Sonographer Comments: Technically difficult study. Patient just had left hip surgery 12/04/20. Could not turn or position patient. IMPRESSIONS  1. Left ventricular ejection fraction, by estimation, is 60 to 65%. The left ventricle has normal function. The left ventricle has no regional wall motion abnormalities. Left ventricular diastolic parameters are consistent with Grade I diastolic dysfunction (impaired relaxation).  2. Right ventricular systolic function is normal. The right ventricular size is normal. Tricuspid regurgitation signal  is inadequate for assessing PA pressure.  3. The mitral valve is normal in structure. No evidence of mitral valve regurgitation. No evidence of mitral stenosis.  4. The aortic valve was not well visualized. Aortic valve regurgitation is not visualized. Mild aortic valve stenosis. Aortic valve area, by VTI measures 1.89 cm. Aortic valve mean gradient measures 17.5 mmHg. Aortic valve Vmax measures 2.56 m/s.  5. The inferior vena cava is normal in size with greater than 50% respiratory variability, suggesting right atrial pressure of 3 mmHg. FINDINGS  Left Ventricle: Left ventricular ejection fraction, by estimation, is 60 to 65%. The left ventricle has normal function. The left ventricle has no regional wall motion abnormalities. Definity contrast agent was given IV to delineate the left ventricular  endocardial borders. The left ventricular internal cavity size was normal in size. There is no left ventricular hypertrophy. Left ventricular diastolic parameters are consistent with Grade I diastolic dysfunction (impaired relaxation). Right Ventricle: The right ventricular size is normal. No increase in right ventricular wall thickness. Right ventricular systolic function is normal. Tricuspid regurgitation signal is inadequate for assessing PA pressure. Left Atrium: Left atrial size was not well visualized. Right Atrium: Right atrial size was not well visualized. Pericardium: There is no evidence of pericardial effusion. Mitral Valve: The mitral valve is normal in structure. Mild mitral annular calcification. No evidence of mitral valve regurgitation. No evidence of mitral valve stenosis. Tricuspid Valve: The tricuspid valve is normal in structure. Tricuspid valve regurgitation is not demonstrated. No evidence of tricuspid stenosis. Aortic Valve: The aortic valve was not well visualized. Aortic valve regurgitation is not visualized. Mild aortic stenosis is present. Aortic valve mean gradient measures 17.5 mmHg. Aortic  valve peak gradient measures 26.2 mmHg. Aortic valve area, by VTI  measures 1.89 cm. Pulmonic Valve: The pulmonic valve was normal in structure. Pulmonic valve regurgitation is not visualized. No evidence of pulmonic stenosis. Aorta: The aortic root is normal in size and structure. Venous: The inferior vena cava is normal in size with greater than 50% respiratory variability, suggesting right atrial pressure of 3 mmHg. IAS/Shunts: The interatrial septum appears to be lipomatous. No atrial level shunt detected by color flow Doppler.  LEFT VENTRICLE PLAX 2D LVIDd:         4.30 cm   Diastology LVIDs:         2.20 cm   LV e' medial:    4.79 cm/s LVOT diam:     1.80 cm   LV E/e' medial:  16.2 LV SV:         86  LV e' lateral:   10.90 cm/s LV SV Index:   45        LV E/e' lateral: 7.1 LVOT Area:     2.54 cm  RIGHT VENTRICLE RV S prime:     26.40 cm/s TAPSE (M-mode): 2.0 cm LEFT ATRIUM             Index LA Biplane Vol: 92.9 ml 48.25 ml/m  AORTIC VALVE AV Area (Vmax):    1.64 cm AV Area (Vmean):   1.47 cm AV Area (VTI):     1.89 cm AV Vmax:           256.00 cm/s AV Vmean:          202.000 cm/s AV VTI:            0.453 m AV Peak Grad:      26.2 mmHg AV Mean Grad:      17.5 mmHg LVOT Vmax:         165.00 cm/s LVOT Vmean:        117.000 cm/s LVOT VTI:          0.337 m LVOT/AV VTI ratio: 0.74  AORTA Ao Root diam: 3.40 cm MITRAL VALVE MV Area (PHT): 2.69 cm     SHUNTS MV Decel Time: 282 msec     Systemic VTI:  0.34 m MV E velocity: 77.70 cm/s   Systemic Diam: 1.80 cm MV A velocity: 133.00 cm/s MV E/A ratio:  0.58 Fransico Him MD Electronically signed by Fransico Him MD Signature Date/Time: 12/05/2020/8:17:05 AM    Final     Cardiac Studies   Echo 12/04/20 IMPRESSIONS     1. Left ventricular ejection fraction, by estimation, is 60 to 65%. The  left ventricle has normal function. The left ventricle has no regional  wall motion abnormalities. Left ventricular diastolic parameters are  consistent with Grade I  diastolic  dysfunction (impaired relaxation).   2. Right ventricular systolic function is normal. The right ventricular  size is normal. Tricuspid regurgitation signal is inadequate for assessing  PA pressure.   3. The mitral valve is normal in structure. No evidence of mitral valve  regurgitation. No evidence of mitral stenosis.   4. The aortic valve was not well visualized. Aortic valve regurgitation  is not visualized. Mild aortic valve stenosis. Aortic valve area, by VTI  measures 1.89 cm. Aortic valve mean gradient measures 17.5 mmHg. Aortic  valve Vmax measures 2.56 m/s.   5. The inferior vena cava is normal in size with greater than 50%  respiratory variability, suggesting right atrial pressure of 3 mmHg.   FINDINGS   Left Ventricle: Left ventricular ejection fraction, by estimation, is 60  to 65%. The left ventricle has normal function. The left ventricle has no  regional wall motion abnormalities. Definity contrast agent was given IV  to delineate the left ventricular   endocardial borders. The left ventricular internal cavity size was normal  in size. There is no left ventricular hypertrophy. Left ventricular  diastolic parameters are consistent with Grade I diastolic dysfunction  (impaired relaxation).   Right Ventricle: The right ventricular size is normal. No increase in  right ventricular wall thickness. Right ventricular systolic function is  normal. Tricuspid regurgitation signal is inadequate for assessing PA  pressure.   Left Atrium: Left atrial size was not well visualized.   Right Atrium: Right atrial size was not well visualized.   Pericardium: There is no evidence of pericardial effusion.   Mitral Valve: The  mitral valve is normal in structure. Mild mitral annular  calcification. No evidence of mitral valve regurgitation. No evidence of  mitral valve stenosis.   Tricuspid Valve: The tricuspid valve is normal in structure. Tricuspid  valve regurgitation  is not demonstrated. No evidence of tricuspid  stenosis.   Aortic Valve: The aortic valve was not well visualized. Aortic valve  regurgitation is not visualized. Mild aortic stenosis is present. Aortic  valve mean gradient measures 17.5 mmHg. Aortic valve peak gradient  measures 26.2 mmHg. Aortic valve area, by VTI   measures 1.89 cm.   Pulmonic Valve: The pulmonic valve was normal in structure. Pulmonic valve  regurgitation is not visualized. No evidence of pulmonic stenosis.   Aorta: The aortic root is normal in size and structure.   Venous: The inferior vena cava is normal in size with greater than 50%  respiratory variability, suggesting right atrial pressure of 3 mmHg.   IAS/Shunts: The interatrial septum appears to be lipomatous. No atrial  level shunt detected by color flow Doppler.       Patient Profile     80 y.o. 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   Assessment & Plan    Elevated troponin 33, 111, 212, 347  EKG with mild lateral ST depression, echo with no changes in EF and no RWMA.  G1DD.  RV normal.   --most likely demand ischemia with known 100% RCA stenosis, 60% LCX stenosis and stent to LAD also with RPDA filled by collaterals from distal LAD.   --tolerated surgery --On ASA, plavix, recent stent 04/2020.  Given anemia (Hgb 7.2), and considering greater than 6 months post stent, would discontinue plavix.  Would transfuse for goal Hgb >8   CAD/TAVR hx --echo post op stable and stable aortic  valve though not well visualized. Mild aortic stenosis is present. Aortic valve mean gradient measures 17.5 mmHg (stable from prior echo). Aortic valve peak gradient measures 26.2 mmHg. Aortic valve area, by VTI  measures 1.89 cm.  --On coreg, lasix, amlodipine, zetia,  imdur and lisinopril.  Hypotensive earlier today, suspect due to blood loss as above.  Will hold amlodipine and lisinopril, can add back as needed.      For questions or updates, please contact [place] Please consult [email or web address] for contact info under        Signed, Donato Heinz, MD  12/06/2020, 12:41 PM

## 2020-12-07 ENCOUNTER — Inpatient Hospital Stay (HOSPITAL_COMMUNITY): Payer: Medicare Other

## 2020-12-07 LAB — BPAM RBC
Blood Product Expiration Date: 202211202359
Blood Product Expiration Date: 202211272359
Blood Product Expiration Date: 202211282359
Blood Product Expiration Date: 202211292359
ISSUE DATE / TIME: 202210300919
ISSUE DATE / TIME: 202210300919
ISSUE DATE / TIME: 202210301012
ISSUE DATE / TIME: 202211011400
Unit Type and Rh: 5100
Unit Type and Rh: 5100
Unit Type and Rh: 5100
Unit Type and Rh: 5100

## 2020-12-07 LAB — TYPE AND SCREEN
ABO/RH(D): O POS
Antibody Screen: NEGATIVE
Unit division: 0
Unit division: 0
Unit division: 0
Unit division: 0

## 2020-12-07 LAB — CBC
HCT: 27.4 % — ABNORMAL LOW (ref 39.0–52.0)
Hemoglobin: 8.3 g/dL — ABNORMAL LOW (ref 13.0–17.0)
MCH: 25.5 pg — ABNORMAL LOW (ref 26.0–34.0)
MCHC: 30.3 g/dL (ref 30.0–36.0)
MCV: 84.3 fL (ref 80.0–100.0)
Platelets: 125 10*3/uL — ABNORMAL LOW (ref 150–400)
RBC: 3.25 MIL/uL — ABNORMAL LOW (ref 4.22–5.81)
RDW: 16.3 % — ABNORMAL HIGH (ref 11.5–15.5)
WBC: 5.1 10*3/uL (ref 4.0–10.5)
nRBC: 0 % (ref 0.0–0.2)

## 2020-12-07 LAB — BASIC METABOLIC PANEL
Anion gap: 7 (ref 5–15)
BUN: 11 mg/dL (ref 8–23)
CO2: 25 mmol/L (ref 22–32)
Calcium: 7.7 mg/dL — ABNORMAL LOW (ref 8.9–10.3)
Chloride: 103 mmol/L (ref 98–111)
Creatinine, Ser: 0.6 mg/dL — ABNORMAL LOW (ref 0.61–1.24)
GFR, Estimated: >60 mL/min (ref >60)
Glucose, Bld: 98 mg/dL (ref 70–99)
Potassium: 3.7 mmol/L (ref 3.5–5.1)
Sodium: 135 mmol/L (ref 135–145)

## 2020-12-07 NOTE — Progress Notes (Signed)
PHYSICAL THERAPY  Out of room in Radiology MRI shoulder.  Pt has been evaluated with rec for SNF but will continue to follow during Acute stay.  Rica Koyanagi  PTA Acute  Rehabilitation Services Pager      [phone] Office      [phone]

## 2020-12-07 NOTE — Care Management Important Message (Signed)
Important Message  Patient Details IM Letter given to the Patient Name: Brandon [name] MRN: [id number] Date of Birth: January 08, 1941   Medicare Important Message Given:  Yes     Kerin Salen 12/07/2020, 1:05 PM

## 2020-12-07 NOTE — Progress Notes (Addendum)
Progress Note  Patient Name: Brandon [name] Date of Encounter: 12/07/2020  [facility] Cardiologist: Loralie Champagne, MD   Subjective   Feeling well. No chest pain, sob or palpitations.    Inpatient Medications    Scheduled Meds:  allopurinol  200 mg Oral Daily   aspirin  81 mg Oral Daily   atorvastatin  80 mg Oral Daily   budesonide (PULMICORT) nebulizer solution  0.5 mg Nebulization BID   carvedilol  12.5 mg Oral BID   citalopram  20 mg Oral Daily   cyanocobalamin  1,000 mcg Subcutaneous Daily   docusate sodium  100 mg Oral BID   ezetimibe  10 mg Oral Daily   furosemide  20 mg Oral Daily   gabapentin  300 mg Oral TID   guaiFENesin  1,200 mg Oral BID   ipratropium-albuterol  3 mL Nebulization BID   iron polysaccharides  150 mg Oral Daily   isosorbide mononitrate  30 mg Oral Daily   mirtazapine  30 mg Oral QHS   multivitamin with minerals  1 tablet Oral Daily   pantoprazole  40 mg Oral Daily   potassium chloride  20 mEq Oral Daily   predniSONE  4 mg Oral Q breakfast   senna  1 tablet Oral BID   senna-docusate  1 tablet Oral QHS   tamsulosin  0.4 mg Oral QHS   Continuous Infusions:  methocarbamol (ROBAXIN) IV Stopped (12/04/20 1356)   PRN Meds: acetaminophen, fluticasone, HYDROmorphone (DILAUDID) injection, menthol-cetylpyridinium **OR** phenol, methocarbamol **OR** methocarbamol (ROBAXIN) IV, metoCLOPramide **OR** metoCLOPramide (REGLAN) injection, ondansetron **OR** ondansetron (ZOFRAN) IV, oxyCODONE, oxyCODONE, polyethylene glycol, sodium phosphate, sorbitol   Vital Signs    Vitals:   12/06/20 2035 12/07/20 0032 12/07/20 0516 12/07/20 0856  BP: (!) 149/77 116/69 120/73   Pulse: 74 73 77   Resp: 19 19 17    Temp: 98.3 F (36.8 C) 98.7 F (37.1 C) 98.7 F (37.1 C)   TempSrc: Oral Oral Oral   SpO2: 97% 91% 92% 94%  Weight:      Height:        Intake/Output Summary (Last 24 hours) at 12/07/2020 0926 Last data filed at 12/07/2020 0844 Gross per 24 hour   Intake 782.14 ml  Output 900 ml  Net -117.86 ml   Last 3 Weights 12/03/2020 12/03/2020 11/30/2020  Weight (lbs) 180 lb 8.9 oz 175 lb 182 lb 14.4 oz  Weight (kg) 81.9 kg 79.379 kg 82.963 kg      Telemetry    SR at 80s - Personally Reviewed  ECG    N/A  Physical Exam   GEN: No acute distress.   Neck: No JVD Cardiac: RRR, no murmurs, rubs, or gallops.  Respiratory: Clear to auscultation bilaterally. GI: Soft, nontender, non-distended  MS: No edema; No deformity. Neuro:  Nonfocal  Psych: Normal affect   Labs    High Sensitivity Troponin:   Recent Labs  Lab 12/03/20 0720 12/03/20 0922 12/03/20 1338 12/03/20 1538  TROPONINIHS 33* 111* 212* 347*     Chemistry Recent Labs  Lab 12/03/20 0720 12/03/20 0801 12/05/20 0445 12/06/20 0430 12/07/20 0443  NA 135   < > 136 136 135  K 3.6   < > 3.8 3.8 3.7  CL 98   < > 102 103 103  CO2 29   < > 28 27 25   GLUCOSE 129*   < > 166* 114* 98  BUN 17   < > 21 17 11   CREATININE 0.90   < >  0.76 0.66 0.60*  CALCIUM 8.4*   < > 8.2* 7.8* 7.7*  MG  --   --  1.7 2.3  --   PROT 6.0*  --   --   --   --   ALBUMIN 3.1*  --   --   --   --   AST 19  --   --   --   --   ALT 14  --   --   --   --   ALKPHOS 99  --   --   --   --   BILITOT 1.0  --   --   --   --   GFRNONAA >60   < > >60 >60 >60  ANIONGAP 8   < > 6 6 7    < > = values in this interval not displayed.    Lipids No results for input(s): CHOL, TRIG, HDL, LABVLDL, LDLCALC, CHOLHDL in the last 168 hours.  Hematology Recent Labs  Lab 12/05/20 0445 12/05/20 1458 12/06/20 0430 12/06/20 1809 12/07/20 0443  WBC 8.0  --  6.2  --  5.1  RBC 3.46*  --  2.90*  --  3.25*  HGB 8.6*   < > 7.2* 8.8* 8.3*  HCT 27.8*   < > 24.1* 29.4* 27.4*  MCV 80.3  --  83.1  --  84.3  MCH 24.9*  --  24.8*  --  25.5*  MCHC 30.9  --  29.9*  --  30.3  RDW 15.5  --  15.9*  --  16.3*  PLT 122*  --  121*  --  125*   < > = values in this interval not displayed.    Radiology    No results  found.  Cardiac Studies   Echo 12/04/2020 1. Left ventricular ejection fraction, by estimation, is 60 to 65%. The  left ventricle has normal function. The left ventricle has no regional  wall motion abnormalities. Left ventricular diastolic parameters are  consistent with Grade I diastolic  dysfunction (impaired relaxation).   2. Right ventricular systolic function is normal. The right ventricular  size is normal. Tricuspid regurgitation signal is inadequate for assessing  PA pressure.   3. The mitral valve is normal in structure. No evidence of mitral valve  regurgitation. No evidence of mitral stenosis.   4. The aortic valve was not well visualized. Aortic valve regurgitation  is not visualized. Mild aortic valve stenosis. Aortic valve area, by VTI  measures 1.89 cm. Aortic valve mean gradient measures 17.5 mmHg. Aortic  valve Vmax measures 2.56 m/s.   5. The inferior vena cava is normal in size with greater than 50%  respiratory variability, suggesting right atrial pressure of 3 mmHg.   Patient Profile     80 y.o. 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    Assessment & Plan    Elevated troponin with hx of CAD - Hs-troponin 33>>111>>212>>347 - EKG with mild lateral ST depression - Echo this admission showed LVEF of 60-65% without WM abnormality  - His elevated troponin  felt demand ischemia from know CAD with 100% RCA stenosis, 60% LCX stenosis and stent to LAD also with RPDA filled by collaterals from distal  LAD - No chest pain  - Given Anemia, his Plavix has discontinued as he is > 6 months post of his last PCI - Continue ASA 81mg  qd, Lipitor 80mg  qd, Zeita 10mg  qd, Coreg 12.5mg  BID and Imdur 30mg  qd   2. S/p TVAR - Echo with mild aortic valve stenosis. Aortic valve area, by VTI  measures 1.89 cm. Aortic valve mean gradient measures 17.5 mmHg. Aortic  valve Vmax measures 2.56 m/s.   3. Anemia - Now off Plavix - HGb dropped to 7.2 (felt post op anemia) >>> transfused 1 unit of PRBC>> HGb 8.8>>8.3 today  - Keep Hgb > 8 per primary team   4. Hypotension  - Felt due to blood loss - Held Amlodipine and lisinopril >> BP improved >> add back gradually  - BP now stable   [facility] will sign off.   Medication Recommendations:  As summarized above Other recommendations (labs, testing, etc): Will need close follow up on hemoglobin  Follow up as an outpatient:  Dr. Aundra Dubin on 12/26/20  For questions or updates, please contact [place] Please consult [email or web address] for contact info under      SignedLeanor Kail, PA  12/07/2020, 9:26 AM     Patient seen and examined.  Agree with above documentation.  On exam, patient is alert and oriented, regular rate and rhythm, 2/6 murmur, lungs CTAB, no LE edema.  Hemoglobin stable at 8.3 today.  Would maintain goal hemoglobin greater than 8 given his CAD with known CTO RCA.  Continue aspirin 81 mg going forward but Plavix discontinued as greater than 6 months from last PCI.  Amlodipine and lisinopril held due to soft BP yesterday, can fold back in as able.  Donato Heinz, MD

## 2020-12-07 NOTE — Progress Notes (Signed)
PROGRESS NOTE    Brandon [name]  AXE:[id number] DOB: 23-Dec-1940 DOA: 12/03/2020 PCP: Haydee Salter, MD    Brief Narrative:  80 y.o. 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  He had cut off the lights last night around midnight when he tripped over the dog bed.  He denies any loss of consciousness or trauma to his head, but had severe pain out of his left hip.  He also hit his left shoulder which he states is difficult for him to raise his arm up due to pain.  He was unable to ambulate, bear weight, or move on the affected leg without being in extreme pain. Pain improve when he doesn't move the leg.  Patient denies having any chest pain, nausea, vomiting, abdominal pain, leg swelling, recent sick contacts.  He chronically has a cough with intermittent wheezing which he relates to his prior history of tonsillar squamous cell carcinoma treatment and difficulty clearing secretions.  Other associated symptoms included chronic pain for which he is on oxycodone followed by pain management. Notes that he is scheduled to have stenting of his abdominal aneurysm next month with vascular surgery.   En route with EMS patient was given fentanyl 100 mcg IV.   ED Course: Upon admission into the emergency department patient was seen to have a temperature of 99.8 F, respirations 16-27, blood pressures maintained, and O2 saturations noted to be as low as 85% with improvement on 2 L nasal cannula oxygen.  Labs significant for WBC 11.1, hemoglobin 9.7, and high-sensitivity troponin 33.   CT of the chest abdomen pelvis noted a left femoral neck fracture with varus angulation.  Orthopedics was formally consulted and recommended transferring patient to [facility] surgery on 12/04/2020  Assessment & Plan:   Principal Problem:   Left displaced femoral neck fracture (HCC) Active Problems:   Coronary artery disease   Hypertension   Aneurysm of infrarenal abdominal aorta   Hypochromic anemia   Anxiety and depression   Chronic pain syndrome   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HCC)   History of transcatheter aortic valve replacement (TAVR)   Chronic obstructive pulmonary disease ([place])   Fall at home, initial encounter   Acute on chronic respiratory failure with hypoxia (HCC)   Elevated troponin   Demand ischemia (HCC)   Acute blood loss anemia  #1 displaced left femoral neck fracture secondary to mechanical fall -Noted to have cut out the lights the night prior to admission stating that he had tripped over the dog bed causing him to fall with continued severe left hip pain. -Imaging done on admission significant for displaced fracture of the left femur. -Pt followed by Orthopedic Surgery and pt underwent left total hip arthroplasty per orthopedics, Dr. Lyla Glassing this morning 12/04/2020. -Pain management and DVT prophylaxis per orthopedics. -MRI shoulder ordered by Orthopedic Surgery, pending results  2.  Leukocytosis -Likely reactive secondary to problem #1. -Patient afebrile. -CT chest negative for any acute infiltrates. -Urinalysis unremarkable. -WBC normalized -No need for antibiotics.  3.  Acute respiratory failure with hypoxia/COPD -Patient noted to have O2 sats  as low as 85% on room air in the ED. -CT chest done negative for PE, negative for any acute abnormalities. -Patient noted to have some mild wheezing on admission but felt likely to be more upper airway. -Patient with no ongoing tobacco use however has a 60-pack-year history and felt hypoxia may have  been iatrogenic as patient had received 100 MCG's of fentanyl on route with EMS and an additional 50 MCG's in the ED. -Now on room air -Continue Pulmicort, scheduled duo nebs.  4.  Elevated troponin/CAD -Patient noted to have a elevated high-sensitivity troponin however remained asymptomatic. -Patient with complex/complicated cardiac history noted to have RCA with robust collaterals from LAD and 95% LAD stenosis status post PCI/arthrectomy on 04/27/2020. -EKG done with minimal ST depression in the lateral leads which was unchanged. -Elevated troponins likely secondary to demand ischemia. -2D echo obtained with a EF of 60 to 65% with grade 1 diastolic dysfunction on 31/06/4006. -Continue aspirin. -Patient seen in consultation by cardiology who feel elevated troponin likely secondary to demand ischemia and severe CAD. -Per cardiology no further work-up needed at this time. -Continue home cardiac regimen of Coreg, Zetia, Lasix, Imdur. -Due to hemoglobin of 7.2 and patient noted to have greater than 6 months post stent cardiology recommended discontinuation of Plavix and recommended transfusion to keep hemoglobin > 8. -Due to borderline blood pressure lisinopril and Norvasc held per cardiology. -Cardiology had been following, now signed off  5.  Anemia/vitamin B12 deficiency. -Baseline hemoglobin approximately 9-11. -No overt bleeding. -Hemoglobin at 7.2 from 7.9 from 8.6 from 10.5. -Anemia panel with iron of 22, TIBC of 301, ferritin of 77, folate of 8.6, vitamin B12 levels of 151 -Vitamin B12 1000 MCG's subcu daily x7 days or while in the hospital and could transition to oral vitamin B12 supplementation on discharge. -Due to complicated cardiac history we will transfuse 1 unit packed red blood cells to keep hemoglobin above 8. -Transfusion threshold hemoglobin < 8. -Place on oral iron supplementation -Plavix discontinued per cardiology.  6.  Left shoulder pain -Secondary to mechanical  fall. -Imaging studies done with no acute fracture. -Per orthopedics.  7.  Hypertension -Continue Coreg, Lasix, Imdur.   -Due to borderline blood pressure lisinopril and Norvasc held per cardiology recommendations.   8.  Polymyalgia rheumatica on chronic steroids -Continue home regimen prednisone.   9.  Chronic pain -Patient being followed in the outpatient setting by pain management. -Oxycodone as needed. -Further pain management due to acute fracture per orthopedics.  10.  Depression/anxiety -Celexa.  11.  AAA -Patient with known 4.1 cm AAA for which she supposed to undergo endovascular stenting in December with vascular surgery. -Outpatient follow-up with vascular surgery.  12.  Hyperlipidemia -Zetia, statin.    13.  Prior history of tobacco abuse -Per admitting physician patient noted to have had a 60-pack-year however no ongoing tobacco use since being diagnosed with cancer 3 years ago.  14.  GERD with history of esophageal stricture -Continue PPI as tolerated   DVT prophylaxis: SCD's Code Status: Full Family Communication: Pt in room, family not at bedside  Status is: Inpatient  Remains inpatient appropriate because: Severity of illness    Consultants:  Orthopedic Surgery  Procedures:    Antimicrobials: Anti-infectives (From admission, onward)    Start     Dose/Rate Route Frequency Ordered Stop   12/04/20 1500  ceFAZolin (ANCEF) IVPB 2g/100 mL premix        2 g 200 mL/hr over 30 Minutes Intravenous Every 6  hours 12/04/20 1310 12/05/20 0424   12/04/20 0600  ceFAZolin (ANCEF) IVPB 2g/100 mL premix        2 g 200 mL/hr over 30 Minutes Intravenous On call to O.R. 12/03/20 2141 12/04/20 0854       Subjective: Eager to go home soon  Objective: Vitals:   12/07/20 0032 12/07/20 0516 12/07/20 0856 12/07/20 1233  BP: 116/69 120/73  134/74  Pulse: 73 77  78  Resp: 19 17  17   Temp: 98.7 F (37.1 C) 98.7 F (37.1 C)  99.4 F (37.4 C)  TempSrc: Oral  Oral  Oral  SpO2: 91% 92% 94% 95%  Weight:      Height:        Intake/Output Summary (Last 24 hours) at 12/07/2020 1729 Last data filed at 12/07/2020 1649 Gross per 24 hour  Intake 120 ml  Output 1950 ml  Net -1830 ml   Filed Weights   12/03/20 1932 12/03/20 2038  Weight: 79.4 kg 81.9 kg    Examination: General exam: Awake, laying in bed, in nad Respiratory system: Normal respiratory effort, no wheezing Cardiovascular system: regular rate, s1, s2 Gastrointestinal system: Soft, nondistended, positive BS Central nervous system: CN2-12 grossly intact, strength intact Extremities: Perfused, no clubbing Skin: Normal skin turgor, no notable skin lesions seen Psychiatry: Mood normal // no visual hallucinations   Data Reviewed: I have personally reviewed following labs and imaging studies  CBC: Recent Labs  Lab 12/03/20 0720 12/03/20 0801 12/04/20 0431 12/04/20 1038 12/05/20 0445 12/05/20 1458 12/06/20 0430 12/06/20 1809 12/07/20 0443  WBC 11.1*  --  5.7  --  8.0  --  6.2  --  5.1  HGB 9.7*   < > 9.6*   < > 8.6* 7.9* 7.2* 8.8* 8.3*  HCT 32.6*   < > 33.6*   < > 27.8* 26.2* 24.1* 29.4* 27.4*  MCV 80.9  --  82.2  --  80.3  --  83.1  --  84.3  PLT 159  --  136*  --  122*  --  121*  --  125*   < > = values in this interval not displayed.   Basic Metabolic Panel: Recent Labs  Lab 12/03/20 0720 12/03/20 0801 12/04/20 0431 12/04/20 1038 12/05/20 0445 12/06/20 0430 12/07/20 0443  NA 135 135 138 137 136 136 135  K 3.6 3.6 3.8 4.5 3.8 3.8 3.7  CL 98 97* 100  --  102 103 103  CO2 29  --  29  --  28 27 25   GLUCOSE 129* 127* 95  --  166* 114* 98  BUN 17 17 17   --  21 17 11   CREATININE 0.90 0.80 0.78  --  0.76 0.66 0.60*  CALCIUM 8.4*  --  8.5*  --  8.2* 7.8* 7.7*  MG  --   --   --   --  1.7 2.3  --    GFR: Estimated Creatinine Clearance: 77.3 mL/min (A) (by C-G formula based on SCr of 0.6 mg/dL (L)). Liver Function Tests: Recent Labs  Lab 12/03/20 0720  AST 19  ALT  14  ALKPHOS 99  BILITOT 1.0  PROT 6.0*  ALBUMIN 3.1*   No results for input(s): LIPASE, AMYLASE in the last 168 hours. No results for input(s): AMMONIA in the last 168 hours. Coagulation Profile: Recent Labs  Lab 12/03/20 0720  INR 1.0   Cardiac Enzymes: No results for input(s): CKTOTAL, CKMB, CKMBINDEX, TROPONINI in the last 168 hours. BNP (last 3  results) No results for input(s): PROBNP in the last 8760 hours. HbA1C: No results for input(s): HGBA1C in the last 72 hours. CBG: Recent Labs  Lab 12/04/20 1423  GLUCAP 178*   Lipid Profile: No results for input(s): CHOL, HDL, LDLCALC, TRIG, CHOLHDL, LDLDIRECT in the last 72 hours. Thyroid Function Tests: No results for input(s): TSH, T4TOTAL, FREET4, T3FREE, THYROIDAB in the last 72 hours. Anemia Panel: Recent Labs    12/04/20 1958  VITAMINB12 151*  FOLATE 8.6  FERRITIN 77  TIBC 301  IRON 22*   Sepsis Labs: Recent Labs  Lab 12/03/20 0720  LATICACIDVEN 0.9    Recent Results (from the past 240 hour(s))  Resp Panel by RT-PCR (Flu A&B, Covid) Nasopharyngeal Swab     Status: None   Collection Time: 12/03/20  7:20 AM   Specimen: Nasopharyngeal Swab; Nasopharyngeal(NP) swabs in vial transport medium  Result Value Ref Range Status   SARS Coronavirus 2 by RT PCR NEGATIVE NEGATIVE Final    Comment: (NOTE) SARS-CoV-2 target nucleic acids are NOT DETECTED.  The SARS-CoV-2 RNA is generally detectable in upper respiratory specimens during the acute phase of infection. The lowest concentration of SARS-CoV-2 viral copies this assay can detect is 138 copies/mL. A negative result does not preclude SARS-Cov-2 infection and should not be used as the sole basis for treatment or other patient management decisions. A negative result may occur with  improper specimen collection/handling, submission of specimen other than nasopharyngeal swab, presence of viral mutation(s) within the areas targeted by this assay, and inadequate  number of viral copies(<138 copies/mL). A negative result must be combined with clinical observations, patient history, and epidemiological information. The expected result is Negative.  Fact Sheet for Patients:  EntrepreneurPulse.com.au  Fact Sheet for Healthcare Providers:  IncredibleEmployment.be  This test is no t yet approved or cleared by the [place] FDA and  has been authorized for detection and/or diagnosis of SARS-CoV-2 by FDA under an Emergency Use Authorization (EUA). This EUA will remain  in effect (meaning this test can be used) for the duration of the COVID-19 declaration under Section 564(b)(1) of the Act, 21 U.S.C.section 360bbb-3(b)(1), unless the authorization is terminated  or revoked sooner.       Influenza A by PCR NEGATIVE NEGATIVE Final   Influenza B by PCR NEGATIVE NEGATIVE Final    Comment: (NOTE) The Xpert Xpress SARS-CoV-2/FLU/RSV plus assay is intended as an aid in the diagnosis of influenza from Nasopharyngeal swab specimens and should not be used as a sole basis for treatment. Nasal washings and aspirates are unacceptable for Xpert Xpress SARS-CoV-2/FLU/RSV testing.  Fact Sheet for Patients: EntrepreneurPulse.com.au  Fact Sheet for Healthcare Providers: IncredibleEmployment.be  This test is not yet approved or cleared by the [place] FDA and has been authorized for detection and/or diagnosis of SARS-CoV-2 by FDA under an Emergency Use Authorization (EUA). This EUA will remain in effect (meaning this test can be used) for the duration of the COVID-19 declaration under Section 564(b)(1) of the Act, 21 U.S.C. section 360bbb-3(b)(1), unless the authorization is terminated or revoked.  Performed at [facility], [place] [address] [place], [place] [address]      Radiology Studies: No results found.  Scheduled Meds:  allopurinol  200 mg Oral Daily   aspirin   81 mg Oral Daily   atorvastatin  80 mg Oral Daily   budesonide (PULMICORT) nebulizer solution  0.5 mg Nebulization BID   carvedilol  12.5 mg Oral BID   citalopram  20 mg Oral Daily   cyanocobalamin  1,000 mcg Subcutaneous Daily   docusate sodium  100 mg Oral BID   ezetimibe  10 mg Oral Daily   furosemide  20 mg Oral Daily   gabapentin  300 mg Oral TID   guaiFENesin  1,200 mg Oral BID   ipratropium-albuterol  3 mL Nebulization BID   iron polysaccharides  150 mg Oral Daily   isosorbide mononitrate  30 mg Oral Daily   mirtazapine  30 mg Oral QHS   multivitamin with minerals  1 tablet Oral Daily   pantoprazole  40 mg Oral Daily   potassium chloride  20 mEq Oral Daily   predniSONE  4 mg Oral Q breakfast   senna  1 tablet Oral BID   senna-docusate  1 tablet Oral QHS   tamsulosin  0.4 mg Oral QHS   Continuous Infusions:  methocarbamol (ROBAXIN) IV Stopped (12/04/20 1356)     LOS: 4 days   Marylu Lund, MD [facility] Pager On Amion  If 7PM-7AM, please contact night-coverage 12/07/2020, 5:29 PM

## 2020-12-08 NOTE — Progress Notes (Signed)
Physical Therapy Treatment Patient Details Name: Brandon [name] MRN: [id number] DOB: 08-08-1940 Today's Date: 12/08/2020   History of Present Illness 80 yo male admitted with L femoral neck fx s/p THA-DA 12/04/20 and L shoulder pain after fall at home.    PT Comments    POD # 5 Assisted with amb a greater distance in hallway.  Practiced 2 steps.  General stair comments: instructed to hold RIGHT rail with both hands and 25% VC's on proper sequencing.  Performed well. Addressed all mobility questions, discussed appropriate activity, educated on use of ICE.  Pt ready for D/C to home.   Recommendations for follow up therapy are one component of a multi-disciplinary discharge planning process, led by the attending physician.  Recommendations may be updated based on patient status, additional functional criteria and insurance authorization.  Follow Up Recommendations  Home health PT     Assistance Recommended at Discharge    Equipment Recommendations  Rolling walker (2 wheels)    Recommendations for Other Services       Precautions / Restrictions Precautions Precautions: Fall Restrictions Weight Bearing Restrictions: No LLE Weight Bearing: Weight bearing as tolerated Other Position/Activity Restrictions: WBAT     Mobility  Bed Mobility Overal bed mobility: Needs Assistance Bed Mobility: Supine to Sit;Sit to Supine     Supine to sit: Min guard Sit to supine: Min guard;Min assist   General bed mobility comments: Assist for L LE 2* pain. Practiced using gait belt as leg lifter    Transfers Overall transfer level: Needs assistance Equipment used: Rolling walker (2 wheels) Transfers: Sit to/from Stand Sit to Stand: Supervision           General transfer comment: Cues for safety, technique, hand placement.and safety with turns    Ambulation/Gait Ambulation/Gait assistance: Supervision;Min guard Gait Distance (Feet): 115 Feet Assistive device: Rolling walker (2  wheels) Gait Pattern/deviations: Step-to pattern;Decreased stride length Gait velocity: decreased   General Gait Details: Min guard for safety. Cues for safety. No LOB with RW use.   Stairs Stairs: Yes Stairs assistance: Supervision;Min guard Stair Management: Step to pattern;One rail Right;Forwards Number of Stairs: 2 General stair comments: instructed to hold RIGHT rail with both hands and 25% VC's on proper sequencing.  Performed well.   Wheelchair Mobility    Modified Rankin (Stroke Patients Only)       Balance                                            Cognition   Behavior During Therapy: WFL for tasks assessed/performed Overall Cognitive Status: Within Functional Limits for tasks assessed                                 General Comments: AxO x 3 very pleasant        Exercises      General Comments        Pertinent Vitals/Pain Pain Assessment: 0-10 Pain Score: 6  Pain Location: L hip/thigh Pain Descriptors / Indicators: Discomfort;Sore;Guarding Pain Intervention(s): Monitored during session;Premedicated before session;Repositioned;Ice applied    Home Living                          Prior Function            PT Goals (  current goals can now be found in the care plan section) Progress towards PT goals: Progressing toward goals    Frequency    Min 3X/week      PT Plan Current plan remains appropriate    Co-evaluation              AM-PAC PT "6 Clicks" Mobility   Outcome Measure  Help needed turning from your back to your side while in a flat bed without using bedrails?: A Little Help needed moving from lying on your back to sitting on the side of a flat bed without using bedrails?: A Little Help needed moving to and from a bed to a chair (including a wheelchair)?: A Little Help needed standing up from a chair using your arms (e.g., wheelchair or bedside chair)?: A Little Help needed to walk  in hospital room?: A Little Help needed climbing 3-5 steps with a railing? : A Little 6 Click Score: 18    End of Session Equipment Utilized During Treatment: Gait belt Activity Tolerance: Patient tolerated treatment well Patient left: in bed;with call bell/phone within reach;with bed alarm set Nurse Communication: Mobility status PT Visit Diagnosis: Difficulty in walking, not elsewhere classified (R26.2);Other abnormalities of gait and mobility (R26.89)     Time: 7829-5621 PT Time Calculation (min) (ACUTE ONLY): 20 min  Charges:  $Gait Training: 8-22 mins                    Rica Koyanagi  PTA Acute  Rehabilitation Services Pager      [phone] Office      [phone]

## 2020-12-08 NOTE — Plan of Care (Signed)

## 2020-12-08 NOTE — Discharge Summary (Signed)
Physician Discharge Summary  Brandon [name]:[id number] DOB: 04-26-1940 DOA: 12/03/2020  PCP: Haydee Salter, MD  Admit date: 12/03/2020 Discharge date: 12/08/2020  Admitted From: Home Disposition:  Home  Recommendations for Outpatient Follow-up:  Follow up with PCP in 1-2 weeks Follow up with Orthopedic Surgery as scheduled  Home Health:PT, OT   Discharge Condition:Stable CODE STATUS:Full Diet recommendation: Heart healthy   Brief/Interim Summary: 80 y.o. 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  He had cut off the lights last night around midnight when he tripped over the dog bed.  He denies any loss of consciousness or trauma to his head, but had severe pain out of his left hip.  He also hit his left shoulder which he states is difficult for him to raise his arm up due to pain.  He was unable to ambulate, bear weight, or move on the affected leg without being in extreme pain. Pain improve when he doesn't move the leg.  Patient denies having any chest pain, nausea, vomiting, abdominal pain, leg swelling, recent sick contacts.  He chronically has a cough with intermittent wheezing which he relates to his prior history of tonsillar squamous cell carcinoma treatment and difficulty clearing secretions.  Other associated symptoms included chronic pain for which he is on oxycodone followed by pain management. Notes that he is scheduled to have stenting of his abdominal aneurysm next month with vascular surgery.   En route with EMS patient was given fentanyl  100 mcg IV.   ED Course: Upon admission into the emergency department patient was seen to have a temperature of 99.8 F, respirations 16-27, blood pressures maintained, and O2 saturations noted to be as low as 85% with improvement on 2 L nasal cannula oxygen.  Labs significant for WBC 11.1, hemoglobin 9.7, and high-sensitivity troponin 33.  CT of the chest abdomen pelvis noted a left femoral neck fracture with varus angulation.  Orthopedics was formally consulted and recommended transferring patient to [facility] surgery on 12/04/2020   Discharge Diagnoses:  Principal Problem:   Left displaced femoral neck fracture ([place]) Active Problems:   Coronary artery disease   Hypertension   Aneurysm of infrarenal abdominal aorta   Hypochromic anemia   Anxiety and depression   Chronic pain syndrome   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HCC)   History of transcatheter aortic valve replacement (TAVR)   Chronic obstructive pulmonary disease ([place])   Fall at home, initial encounter   Acute on chronic respiratory failure with hypoxia (HCC)   Elevated troponin   Demand ischemia (HCC)   Acute blood loss anemia  #1 displaced left femoral neck fracture secondary to mechanical fall -Noted to have cut out the lights the night prior to admission stating that he had tripped over the dog bed causing him to fall with continued severe left hip pain. -Imaging done on admission significant for displaced fracture of the left femur. -Pt followed by Orthopedic Surgery and pt underwent left total hip arthroplasty per orthopedics, Dr. Lyla Glassing this morning 12/04/2020. -Pain management and DVT prophylaxis per orthopedics. -MRI shoulder ordered by Orthopedic  Surgery, pending results  2.  Leukocytosis -Likely reactive secondary to problem #1. -Patient afebrile. -CT chest negative for any acute infiltrates. -Urinalysis unremarkable. -WBC normalized -No need for antibiotics.  3.  Acute respiratory failure with  hypoxia/COPD -Patient noted to have O2 sats as low as 85% on room air in the ED. -CT chest done negative for PE, negative for any acute abnormalities. -Patient noted to have some mild wheezing on admission but felt likely to be more upper airway. -Patient with no ongoing tobacco use however has a 60-pack-year history and felt hypoxia may have been iatrogenic as patient had received 100 MCG's of fentanyl on route with EMS and an additional 50 MCG's in the ED. -Now on room air -Continue Pulmicort, scheduled duo nebs.  4.  Elevated troponin/CAD -Patient noted to have a elevated high-sensitivity troponin however remained asymptomatic. -Patient with complex/complicated cardiac history noted to have RCA with robust collaterals from LAD and 95% LAD stenosis status post PCI/arthrectomy on 04/27/2020. -EKG done with minimal ST depression in the lateral leads which was unchanged. -Elevated troponins likely secondary to demand ischemia. -2D echo obtained with a EF of 60 to 65% with grade 1 diastolic dysfunction on 70/07/2374. -Continue aspirin. -Patient seen in consultation by cardiology who feel elevated troponin likely secondary to demand ischemia and severe CAD. -Per cardiology no further work-up needed at this time. -Continue home cardiac regimen of Coreg, Zetia, Lasix, Imdur. -Due to hemoglobin of 7.2 and patient noted to have greater than 6 months post stent cardiology recommended discontinuation of Plavix and recommended transfusion to keep hemoglobin > 8. -Due to borderline blood pressure lisinopril and Norvasc held per cardiology. -Cardiology had been following, now signed off  5.  Anemia/vitamin B12 deficiency. -Baseline hemoglobin approximately 9-11. -No overt bleeding. -Hemoglobin at 7.2 from 7.9 from 8.6 from 10.5. -Anemia panel with iron of 22, TIBC of 301, ferritin of 77, folate of 8.6, vitamin B12 levels of 151 -Vitamin B12 1000 MCG's subcu daily x7 days or while in the hospital and  could transition to oral vitamin B12 supplementation on discharge. -Due to complicated cardiac history we will transfuse 1 unit packed red blood cells to keep hemoglobin above 8. -Transfusion threshold hemoglobin < 8. -Place on oral iron supplementation -Plavix discontinued per cardiology.  6.  Left shoulder pain -Secondary to mechanical fall. -Imaging studies done with no acute fracture. -Per orthopedics.  7.  Hypertension -Continue Coreg, Lasix, Imdur.   -Due to borderline blood pressure lisinopril and Norvasc held per cardiology recommendations.   8.  Polymyalgia rheumatica on chronic steroids -Continue home regimen prednisone.   9.  Chronic pain -Patient being followed in the outpatient setting by pain management. -Oxycodone as needed. -Further pain management due to acute fracture per orthopedics.  10.  Depression/anxiety -Celexa.  11.  AAA -Patient with known 4.1 cm AAA for which she supposed to undergo endovascular stenting in December with vascular surgery. -Outpatient follow-up with vascular surgery.  12.  Hyperlipidemia -Zetia, statin.    13.  Prior history of tobacco abuse -Per admitting physician patient noted to have had a 60-pack-year however no ongoing tobacco use since being diagnosed with cancer 3 years ago.  14.  GERD with history of esophageal stricture -Continue PPI as tolerated   Discharge Instructions   Allergies as of 12/08/2020       Reactions   Celebrex [celecoxib] Hives, Itching   Tape    Other reaction(s): Other (See Comments) Tears skin. Prefers paper tape  Medication List     STOP taking these medications    aspirin 81 MG EC tablet Replaced by: aspirin 81 MG chewable tablet   traMADol 50 MG tablet Commonly known as: ULTRAM       TAKE these medications    allopurinol 100 MG tablet Commonly known as: ZYLOPRIM Take 2 tablets (200 mg total) by mouth daily.   amLODipine 10 MG tablet Commonly known as: NORVASC TAKE  1 TABLET(10 MG) BY MOUTH DAILY What changed: See the new instructions.   aspirin 81 MG chewable tablet Chew 1 tablet (81 mg total) by mouth 2 (two) times daily with a meal. Replaces: aspirin 81 MG EC tablet   atorvastatin 80 MG tablet Commonly known as: LIPITOR Take 1 tablet (80 mg total) by mouth daily.   carvedilol 12.5 MG tablet Commonly known as: Coreg Take 1 tablet (12.5 mg total) by mouth 2 (two) times daily.   citalopram 20 MG tablet Commonly known as: CeleXA Take 1 tablet (20 mg total) by mouth daily.   clopidogrel 75 MG tablet Commonly known as: PLAVIX Take 75 mg by mouth daily.   ezetimibe 10 MG tablet Commonly known as: Zetia Take 1 tablet (10 mg total) by mouth daily.   fluticasone 50 MCG/ACT nasal spray Commonly known as: FLONASE SHAKE LIQUID AND USE 2 SPRAYS IN EACH NOSTRIL DAILY AS NEEDED FOR RHINITIS OR ALLERGIES What changed: See the new instructions.   furosemide 20 MG tablet Commonly known as: LASIX Take 1 tablet (20 mg total) by mouth daily.   gabapentin 300 MG capsule Commonly known as: NEURONTIN TAKE 1 CAPSULE(300 MG) BY MOUTH THREE TIMES DAILY What changed: See the new instructions.   isosorbide mononitrate 30 MG 24 hr tablet Commonly known as: IMDUR TAKE 1 TABLET(30 MG) BY MOUTH AT BEDTIME What changed: See the new instructions.   lisinopril 20 MG tablet Commonly known as: ZESTRIL TAKE 1 TABLET(10 MG) BY MOUTH DAILY   mirtazapine 30 MG tablet Commonly known as: REMERON TAKE 1 TABLET BY MOUTH EVERY NIGHT AT BEDTIME   Oxycodone HCl 10 MG Tabs Take 1-1.5 tablets (10-15 mg total) by mouth every 4 (four) hours as needed for severe pain (pain score 7-10). What changed:  medication strength how much to take when to take this reasons to take this   pantoprazole 40 MG tablet Commonly known as: PROTONIX Take 1 tablet (40 mg total) by mouth daily.   potassium chloride 10 MEQ tablet Commonly known as: KLOR-CON Take 2 tablets (20 mEq  total) by mouth daily.   predniSONE 1 MG tablet Commonly known as: DELTASONE Take 4 tablets (4 mg total) by mouth daily with breakfast.   tamsulosin 0.4 MG Caps capsule Commonly known as: FLOMAX Take 0.4 mg by mouth at bedtime.               Durable Medical Equipment  (From admission, onward)           Start     Ordered   12/05/20 1517  For home use only DME Walker rolling  Once       Question Answer Comment  Walker: With 5 Inch Wheels   Patient needs a walker to treat with the following condition Unsteady gait      12/05/20 1516            Follow-up Information     Swinteck, Aaron Edelman, MD. Schedule an appointment as soon as possible for a visit in 2 week(s).   Specialty: Orthopedic Surgery Why:  For wound re-check, For suture removal Contact information: [address] [place] [address] [phone]         [facility] Follow up.   Why: home rolling walker Contact information: [address] [place], [place] [address] [place]        Larey Dresser, MD Follow up on 12/26/2020.   Specialty: Cardiology Why: @11am  Contact information: [address] [place] [address] [phone]         [facility] Follow up.   Specialty: Home Health Services Why: [facility] physical therapy/occupational therapy Contact information: [address] [place] [address] [phone]                Allergies  Allergen Reactions   Celebrex [Celecoxib] Hives and Itching   Tape     Other reaction(s): Other (See Comments) Tears skin. Prefers paper tape    Consultations: Orthopedic surgery Cardiology  Procedures/Studies: DG Chest 1 View  Result Date: 12/03/2020 CLINICAL DATA:  Trauma, fall EXAM: CHEST  1 VIEW COMPARISON:  06/06/2020 FINDINGS: Transverse diameter of heart is slightly increased. There is metallic stent in the region of aortic valve. Thoracic aorta is tortuous and ectatic. There are no  signs of pulmonary edema or focal pulmonary consolidation. There is new faint linear density in right parahilar region. There is no significant pleural effusion or pneumothorax. IMPRESSION: There are no signs of alveolar pulmonary edema. New faint linear density in the right upper lobe in right parahilar region may suggest subsegmental atelectasis or early pneumonitis. There is no pleural effusion or pneumothorax. Reading location: [place], [place]. Electronically Signed   By: Elmer Picker M.D.   On: 12/03/2020 08:31   CT HEAD WO CONTRAST  Result Date: 12/03/2020 CLINICAL DATA:  [place] last evening. EXAM: CT HEAD WITHOUT CONTRAST TECHNIQUE: Contiguous axial images were obtained from the base of the skull through the vertex without intravenous contrast. COMPARISON:  None. FINDINGS: Brain: Mild age related cerebral atrophy, ventriculomegaly and periventricular white matter disease. No extra-axial fluid collections are identified. No CT findings for acute hemispheric infarction or intracranial hemorrhage. No mass lesions. The brainstem and cerebellum are normal. Vascular: Moderate vascular calcifications but no aneurysm hyperdense vessels. Skull: No skull fracture or bone lesions. Sinuses/Orbits: Scattered sinus disease. The mastoid air cells and middle ear cavities are clear. The globes are intact. Other: No scalp lesions or scalp hematoma. IMPRESSION: 1. Mild age related cerebral atrophy, ventriculomegaly and periventricular white matter disease. 2. No acute intracranial findings or skull fracture. Electronically Signed   By: Marijo Sanes M.D.   On: 12/03/2020 10:01   Pelvis Portable  Result Date: 12/04/2020 CLINICAL DATA:  LEFT hip replacement. EXAM: PORTABLE PELVIS 1-2 VIEWS COMPARISON:  Prior studies FINDINGS: LEFT total hip arthroplasty changes are noted without acute abnormality or complicating hardware features. No dislocation or acute fracture. Fusion hardware within the LOWER lumbar spine  identified. IMPRESSION: LEFT total hip arthroplasty without complicating features. Electronically Signed   By: Margarette Canada M.D.   On: 12/04/2020 14:27   MR SHOULDER LEFT WO CONTRAST  Result Date: 12/08/2020 CLINICAL DATA:  Shoulder trauma, rotator cuff tear suspected, xray done EXAM: MRI OF THE LEFT SHOULDER WITHOUT CONTRAST TECHNIQUE: Multiplanar, multisequence MR imaging of the shoulder was performed. No intravenous contrast was administered. COMPARISON:  Shoulder radiograph 12/03/2020. FINDINGS: Rotator cuff: There is are multiple partial width high-grade and full-thickness tears of the supraspinatus tendon at the footprint, with up to 1.4  cm retraction retraction. The infraspinatus tendon demonstrates moderate tendinosis with bursal sided fraying and low-grade articular sided tearing. Teres minor tendon is intact. Subscapularis tendon is intact. Muscles: No significant muscle atrophy. Biceps Long Head: Intraarticular and extraarticular portions of the biceps tendon are intact. Acromioclavicular Joint: Moderate arthropathy of the acromioclavicular joint. Small amount of subacromial/subdeltoid bursal fluid related to the supraspinatus tear. Glenohumeral Joint: Trace joint effusion. Moderate-severe chondrosis. Labrum: Near circumferential degenerative labral tearing, including the biceps labral anchor. Bones: No fracture or dislocation. No aggressive osseous lesion. Other: No additional findings. IMPRESSION: Partial width high-grade and full-thickness tears of the supraspinatus tendon at the footprint, with up to 1.4 cm retraction. Moderate infraspinatus tendinosis with bursal sided fraying and low-grade articular sided tearing at the footprint. No significant muscle atrophy. Moderate AC joint arthropathy. Glenohumeral arthritis with moderate-severe chondrosis. Near circumferential degenerative labral tearing including at the biceps labral anchor. Electronically Signed   By: Maurine Simmering M.D.   On: 12/08/2020  08:09   CT CHEST ABDOMEN PELVIS W CONTRAST  Result Date: 12/03/2020 CLINICAL DATA:  Fall, back pain.  Known aortic aneurysm EXAM: CT CHEST, ABDOMEN, AND PELVIS WITH CONTRAST TECHNIQUE: Multidetector CT imaging of the chest, abdomen and pelvis was performed following the standard protocol during bolus administration of intravenous contrast. CONTRAST:  148mL OMNIPAQUE IOHEXOL 350 MG/ML SOLN COMPARISON:  CT 11/24/2020 FINDINGS: CT CHEST FINDINGS Cardiovascular: Aortic valve replacement noted. No acute findings the heart, aorta, or great vessels. Mediastinum/Nodes: No axillary or supraclavicular adenopathy. No mediastinal or hilar adenopathy. No pericardial fluid. Esophagus normal. Lungs/Pleura: Mild atelectasis in the posterior RIGHT upper lobe. Pleural fluid. No pneumothorax. No airspace disease. Musculoskeletal: No rib fracture. No scapular fracture No fracture or dislocation. CT ABDOMEN AND PELVIS FINDINGS Hepatobiliary: No focal hepatic lesion. Postcholecystectomy. No biliary dilatation. Pancreas: Pancreas is normal. No ductal dilatation. No pancreatic inflammation. Spleen: Normal spleen Adrenals/urinary tract: Adrenal glands and kidneys are normal. The ureters and bladder normal. Stomach/Bowel: Stomach, small bowel, appendix, and cecum are normal. Multiple diverticula of the descending colon and sigmoid colon without acute inflammation. Vascular/Lymphatic: Fusiform aneurysmal dilatation of the abdominal aorta to 4.1 cm. No change in short interval. No evidence of acute injury Reproductive: Prostatomegaly Other: No free fluid. Musculoskeletal: Fracture through the LEFT femoral neck with varus angulation. Interbody lumbar fusion L4-are after IMPRESSION: Chest Impression: 1. No acute findings in the thorax. 2. No evidence trauma. Abdomen / Pelvis Impression: 1. LEFT femoral neck fracture with varus angulation. 2. No acute findings in the peritoneal space. 3. No change in abdominal aortic aneurysm in short  interval follow-up (8 days prior). See recommendation from CT 11/24/2020. Electronically Signed   By: Suzy Bouchard M.D.   On: 12/03/2020 10:21   DG Shoulder Left  Result Date: 12/03/2020 CLINICAL DATA:  Trauma, fall EXAM: LEFT SHOULDER - 2+ VIEW COMPARISON:  None. FINDINGS: No recent fracture or dislocation is seen. There is decreased distance between acromion and humeral head which may be due to positioning or suggest chronic rotator cuff tear. Osteopenia is seen in bony structures. Surgical clips are seen in left side of neck. IMPRESSION: No recent fracture or dislocation is seen. Reading location: [place], [place]. Electronically Signed   By: Elmer Picker M.D.   On: 12/03/2020 08:45   DG Knee Left Port  Result Date: 12/03/2020 CLINICAL DATA:  Left hip fracture. EXAM: PORTABLE LEFT KNEE - 1-2 VIEW COMPARISON:  None. FINDINGS: The knee joint demonstrates moderate degenerative changes but no acute fracture. No joint effusion.  Advanced vascular calcifications are noted. IMPRESSION: Moderate degenerative changes but no acute bony findings or joint effusion. Electronically Signed   By: Marijo Sanes M.D.   On: 12/03/2020 18:45   DG C-Arm 1-60 Min-No Report  Result Date: 12/04/2020 Fluoroscopy was utilized by the requesting physician.  No radiographic interpretation.   DG C-Arm 1-60 Min-No Report  Result Date: 12/04/2020 Fluoroscopy was utilized by the requesting physician.  No radiographic interpretation.   ECHOCARDIOGRAM COMPLETE  Result Date: 12/05/2020    ECHOCARDIOGRAM REPORT   Patient Name:   CORRY IHNEN Date of Exam: 12/04/2020 Medical Rec #:  [id number]    Height:       66.5 in Accession #:    [id number]   Weight:       180.6 lb Date of Birth:  [birth year]    BSA:          1.925 m Patient Age:    29 years     BP:           94/67 mmHg Patient Gender: M            HR:           84 bpm. Exam Location:  Inpatient Procedure: 2D Echo, Cardiac Doppler and Color Doppler Indications:     Elevated Troponin  History:        Patient has prior history of Echocardiogram examinations, most                 recent 11/07/2020. Risk Factors:Hypertension, Dyslipidemia and                 Former Smoker. S/p TAVR (7mm Sapien).  Sonographer:    Alvino Chapel RCS Referring Phys: [id number] RONDELL A SMITH  Sonographer Comments: Technically difficult study. Patient just had left hip surgery 12/04/20. Could not turn or position patient. IMPRESSIONS  1. Left ventricular ejection fraction, by estimation, is 60 to 65%. The left ventricle has normal function. The left ventricle has no regional wall motion abnormalities. Left ventricular diastolic parameters are consistent with Grade I diastolic dysfunction (impaired relaxation).  2. Right ventricular systolic function is normal. The right ventricular size is normal. Tricuspid regurgitation signal is inadequate for assessing PA pressure.  3. The mitral valve is normal in structure. No evidence of mitral valve regurgitation. No evidence of mitral stenosis.  4. The aortic valve was not well visualized. Aortic valve regurgitation is not visualized. Mild aortic valve stenosis. Aortic valve area, by VTI measures 1.89 cm. Aortic valve mean gradient measures 17.5 mmHg. Aortic valve Vmax measures 2.56 m/s.  5. The inferior vena cava is normal in size with greater than 50% respiratory variability, suggesting right atrial pressure of 3 mmHg. FINDINGS  Left Ventricle: Left ventricular ejection fraction, by estimation, is 60 to 65%. The left ventricle has normal function. The left ventricle has no regional wall motion abnormalities. Definity contrast agent was given IV to delineate the left ventricular  endocardial borders. The left ventricular internal cavity size was normal in size. There is no left ventricular hypertrophy. Left ventricular diastolic parameters are consistent with Grade I diastolic dysfunction (impaired relaxation). Right Ventricle: The right ventricular size is  normal. No increase in right ventricular wall thickness. Right ventricular systolic function is normal. Tricuspid regurgitation signal is inadequate for assessing PA pressure. Left Atrium: Left atrial size was not well visualized. Right Atrium: Right atrial size was not well visualized. Pericardium: There is no evidence of pericardial effusion. Mitral Valve: The mitral valve  is normal in structure. Mild mitral annular calcification. No evidence of mitral valve regurgitation. No evidence of mitral valve stenosis. Tricuspid Valve: The tricuspid valve is normal in structure. Tricuspid valve regurgitation is not demonstrated. No evidence of tricuspid stenosis. Aortic Valve: The aortic valve was not well visualized. Aortic valve regurgitation is not visualized. Mild aortic stenosis is present. Aortic valve mean gradient measures 17.5 mmHg. Aortic valve peak gradient measures 26.2 mmHg. Aortic valve area, by VTI  measures 1.89 cm. Pulmonic Valve: The pulmonic valve was normal in structure. Pulmonic valve regurgitation is not visualized. No evidence of pulmonic stenosis. Aorta: The aortic root is normal in size and structure. Venous: The inferior vena cava is normal in size with greater than 50% respiratory variability, suggesting right atrial pressure of 3 mmHg. IAS/Shunts: The interatrial septum appears to be lipomatous. No atrial level shunt detected by color flow Doppler.  LEFT VENTRICLE PLAX 2D LVIDd:         4.30 cm   Diastology LVIDs:         2.20 cm   LV e' medial:    4.79 cm/s LVOT diam:     1.80 cm   LV E/e' medial:  16.2 LV SV:         86        LV e' lateral:   10.90 cm/s LV SV Index:   45        LV E/e' lateral: 7.1 LVOT Area:     2.54 cm  RIGHT VENTRICLE RV S prime:     26.40 cm/s TAPSE (M-mode): 2.0 cm LEFT ATRIUM             Index LA Biplane Vol: 92.9 ml 48.25 ml/m  AORTIC VALVE AV Area (Vmax):    1.64 cm AV Area (Vmean):   1.47 cm AV Area (VTI):     1.89 cm AV Vmax:           256.00 cm/s AV Vmean:           202.000 cm/s AV VTI:            0.453 m AV Peak Grad:      26.2 mmHg AV Mean Grad:      17.5 mmHg LVOT Vmax:         165.00 cm/s LVOT Vmean:        117.000 cm/s LVOT VTI:          0.337 m LVOT/AV VTI ratio: 0.74  AORTA Ao Root diam: 3.40 cm MITRAL VALVE MV Area (PHT): 2.69 cm     SHUNTS MV Decel Time: 282 msec     Systemic VTI:  0.34 m MV E velocity: 77.70 cm/s   Systemic Diam: 1.80 cm MV A velocity: 133.00 cm/s MV E/A ratio:  0.58 Fransico Him MD Electronically signed by Fransico Him MD Signature Date/Time: 12/05/2020/8:17:05 AM    Final    CT CHEST LUNG CA SCREEN LOW DOSE W/O CM  Result Date: 11/13/2020 CLINICAL DATA:  80 year old male former smoker (quit 1-2 years ago) with 60 pack-year history of smoking. Lung cancer screening examination. EXAM: CT CHEST WITHOUT CONTRAST LOW-DOSE FOR LUNG CANCER SCREENING TECHNIQUE: Multidetector CT imaging of the chest was performed following the standard protocol without IV contrast. COMPARISON:  Chest CTA 04/05/2020. FINDINGS: Cardiovascular: Heart size is normal. There is no significant pericardial fluid, thickening or pericardial calcification. There is aortic atherosclerosis, as well as atherosclerosis of the great vessels of the mediastinum and the coronary arteries, including  calcified atherosclerotic plaque in the left main, left anterior descending, left circumflex and right coronary arteries. Mild calcifications of the mitral annulus. Status post TAVR. Mediastinum/Nodes: No pathologically enlarged mediastinal or hilar lymph nodes. Please note that accurate exclusion of hilar adenopathy is limited on noncontrast CT scans. Irregularity of the proximal trachea adjacent to site of tracheostomy. Esophagus is unremarkable in appearance. No axillary lymphadenopathy. Lungs/Pleura: No suspicious appearing pulmonary nodules or masses are noted. No acute consolidative airspace disease. No pleural effusions. Diffuse bronchial wall thickening with mild centrilobular and  paraseptal emphysema. Upper Abdomen: Aortic atherosclerosis. Musculoskeletal: There are no aggressive appearing lytic or blastic lesions noted in the visualized portions of the skeleton. IMPRESSION: 1. Lung-RADS 1S, negative. Continue annual screening with low-dose chest CT without contrast in 12 months. 2. The "S" modifier above refers to potentially clinically significant non lung cancer related findings. Specifically, there is aortic atherosclerosis, in addition to 3 vessel coronary artery disease. Assessment for potential risk factor modification, dietary therapy or pharmacologic therapy may be warranted, if clinically indicated. 3. Mild diffuse bronchial wall thickening with mild centrilobular and paraseptal emphysema; imaging findings suggestive of underlying COPD. Aortic Atherosclerosis (ICD10-I70.0) and Emphysema (ICD10-J43.9). Electronically Signed   By: Vinnie Langton M.D.   On: 11/13/2020 06:12   DG HIP UNILAT WITH PELVIS 2-3 VIEWS LEFT  Result Date: 12/04/2020 CLINICAL DATA:  LEFT hip fracture. EXAM: DG HIP (WITH OR WITHOUT PELVIS) 2-3V LEFT COMPARISON:  Radiograph 12/03/2020 FINDINGS: Four spot intraoperative radiographs demonstrate LEFT hip total arthroplasty for repair of LEFT femoral neck fracture. IMPRESSION: LEFT hip arthroplasty without complication. Electronically Signed   By: Suzy Bouchard M.D.   On: 12/04/2020 11:11   DG Hip Unilat W or Wo Pelvis 2-3 Views Left  Result Date: 12/03/2020 CLINICAL DATA:  Trauma, fall EXAM: DG HIP (WITH OR WITHOUT PELVIS) 2-3V LEFT COMPARISON:  None. FINDINGS: Comminuted fracture is seen in the neck of left femur. There is overriding of fracture fragments and varus deformity at the fracture site. Postsurgical and degenerative changes are noted in lower lumbar spine. Arterial calcifications are seen in soft tissues. IMPRESSION: Displaced fracture is seen in neck of left femur. Reading location: [place], [place]. Electronically Signed   By: Elmer Picker M.D.   On: 12/03/2020 08:34   CT ANGIO ABDOMEN PELVIS  W &/OR WO CONTRAST  Result Date: 11/24/2020 CLINICAL DATA:  80 year old male with history of abdominal aortic aneurysm. Follow-up study. EXAM: CT ANGIOGRAPHY ABDOMEN AND PELVIS WITH CONTRAST AND WITHOUT CONTRAST TECHNIQUE: Multidetector CT imaging of the abdomen and pelvis was performed using the standard protocol during bolus administration of intravenous contrast. Multiplanar reconstructed images and MIPs were obtained and reviewed to evaluate the vascular anatomy. CONTRAST:  24mL OMNIPAQUE IOHEXOL 350 MG/ML SOLN COMPARISON:  None. FINDINGS: VASCULAR Aorta: Similar appearing fusiform infrarenal abdominal aortic aneurysm measuring up to 52 x 44 mm in maximum dimension, increased from 49 x 42 mm by my measurements. Scattered atherosclerotic calcifications in the normal caliber and patent suprarenal abdominal aorta. Celiac: Patent without evidence of aneurysm, dissection, vasculitis or significant stenosis. SMA: Patent without evidence of aneurysm, dissection, vasculitis or significant stenosis. Replaced common hepatic artery arising from the proximal superior mesenteric artery. Renals: Dual right renal arteries are patent. Triple left renal arteries appear patent, the most diminutive supplying the atrophic lower pole. IMA: Patent without evidence of aneurysm, dissection, vasculitis or significant stenosis. Inflow: Moderate proximal stenosis of the bilateral common iliac artery secondary to atherosclerotic plaques. Patent distally bilaterally. The  remaining bilateral external and internal iliac arteries are patent. Proximal Outflow: Bilateral common femoral and visualized portions of the superficial and profunda femoral arteries are patent without evidence of aneurysm, dissection, vasculitis or significant stenosis. Veins: No obvious venous abnormality within the limitations of this arterial phase study. Review of the MIP images confirms the  above findings. NON-VASCULAR Lower chest: Moderate global cardiomegaly. Partially visualized TAVR. Hepatobiliary: No focal liver abnormality is seen. Status post cholecystectomy. No biliary dilatation. Pancreas: Unremarkable. No pancreatic ductal dilatation or surrounding inflammatory changes. Spleen: Normal in size without focal abnormality. Adrenals/Urinary Tract: Adrenal glands are unremarkable. Focal cortical thinning about the right kidney inferior pole. Kidneys are normal, without renal calculi, focal lesion, or hydronephrosis. Bladder is unremarkable. Stomach/Bowel: Stomach is within normal limits. Appendix appears normal. Colonic diverticula, most prominent the sigmoid colon, without surrounding inflammatory changes. No evidence of bowel wall thickening, distention, or inflammatory changes. Lymphatic: No abdominopelvic lymphadenopathy. Reproductive: Prostate is enlarged measuring up to 5.1 cm in greatest axial dimension with median lobe protrusion into the base of bladder. Other: No abdominal wall hernia or abnormality. No abdominopelvic ascites. Musculoskeletal: Multilevel degenerative changes of the visualized thoracolumbar spine. Disc spacer in place at L4-L5. No acute osseous abnormality. No aggressive appearing osseous lesion. IMPRESSION: VASCULAR 1. Slight interval increased size of previously visualized fusiform infrarenal abdominal aortic aneurysm which now measures up to 5.2 cm (previously 4.9 cm measurements). Recommend referral to a Vascular Specialist if not already established. This recommendation follows ACR consensus guidelines: White Paper of the ACR Incidental Findings Committee II on Vascular Findings. J Am Coll Radiol 2013; 10:789-794. 2. 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 NON-VASCULAR 1. Similar appearing sigmoid diverticulosis. 2. Prostatomegaly. Ruthann Cancer, MD [facility] Electronically Signed   By: Ruthann Cancer M.D.   On: 11/24/2020 16:18    Subjective: Eager to go home  Discharge Exam: Vitals:   12/08/20 0533 12/08/20 0844  BP: 135/75   Pulse: 78   Resp: 18   Temp: 98.2 F (36.8 C)   SpO2: 94% 92%   Vitals:   12/07/20 2105 12/07/20 2144 12/08/20 0533 12/08/20 0844  BP:  (!) 149/80 135/75   Pulse: 77 84 78   Resp: 18 18 18    Temp:  98 F (36.7 C) 98.2 F (36.8 C)   TempSrc:  Oral Oral   SpO2: 96% 96% 94% 92%  Weight:      Height:        General: Pt is alert, awake, not in acute distress Cardiovascular: RRR, S1/S2 + Respiratory: CTA bilaterally, no wheezing, no rhonchi Abdominal: Soft, NT, ND, bowel sounds + Extremities: no edema, no cyanosis   The results of significant diagnostics from this hospitalization (including imaging, microbiology, ancillary and laboratory) are listed below for reference.     Microbiology: Recent Results (from the past 240 hour(s))  Resp Panel by RT-PCR (Flu A&B, Covid) Nasopharyngeal Swab     Status: None   Collection Time: 12/03/20  7:20 AM   Specimen: Nasopharyngeal Swab; Nasopharyngeal(NP) swabs in vial transport medium  Result Value Ref Range Status   SARS Coronavirus 2 by RT PCR NEGATIVE NEGATIVE Final    Comment: (NOTE) SARS-CoV-2 target nucleic acids are NOT DETECTED.  The SARS-CoV-2 RNA is generally detectable in upper respiratory specimens during the acute phase of infection. The lowest concentration of SARS-CoV-2 viral copies this assay can detect is 138  copies/mL. A negative result does not preclude SARS-Cov-2 infection and should not be used as the sole basis for treatment or other patient management decisions. A negative result may occur with  improper specimen collection/handling, submission of specimen other than nasopharyngeal swab, presence of viral mutation(s) within the areas targeted by this assay, and inadequate number of  viral copies(<138 copies/mL). A negative result must be combined with clinical observations, patient history, and epidemiological information. The expected result is Negative.  Fact Sheet for Patients:  EntrepreneurPulse.com.au  Fact Sheet for Healthcare Providers:  IncredibleEmployment.be  This test is no t yet approved or cleared by the [place] FDA and  has been authorized for detection and/or diagnosis of SARS-CoV-2 by FDA under an Emergency Use Authorization (EUA). This EUA will remain  in effect (meaning this test can be used) for the duration of the COVID-19 declaration under Section 564(b)(1) of the Act, 21 U.S.C.section 360bbb-3(b)(1), unless the authorization is terminated  or revoked sooner.       Influenza A by PCR NEGATIVE NEGATIVE Final   Influenza B by PCR NEGATIVE NEGATIVE Final    Comment: (NOTE) The Xpert Xpress SARS-CoV-2/FLU/RSV plus assay is intended as an aid in the diagnosis of influenza from Nasopharyngeal swab specimens and should not be used as a sole basis for treatment. Nasal washings and aspirates are unacceptable for Xpert Xpress SARS-CoV-2/FLU/RSV testing.  Fact Sheet for Patients: EntrepreneurPulse.com.au  Fact Sheet for Healthcare Providers: IncredibleEmployment.be  This test is not yet approved or cleared by the [place] FDA and has been authorized for detection and/or diagnosis of SARS-CoV-2 by FDA under an Emergency Use Authorization (EUA). This EUA will remain in effect (meaning this test can be used) for the duration of the COVID-19 declaration under Section 564(b)(1) of the Act, 21 U.S.C. section 360bbb-3(b)(1), unless the authorization is terminated or revoked.  Performed at [facility], [place] [address] [place], [place] [address]      Labs: BNP (last 3 results) Recent Labs    11/25/20 0919  BNP 568.1*   Basic Metabolic Panel: Recent  Labs  Lab 12/03/20 0720 12/03/20 0801 12/04/20 0431 12/04/20 1038 12/05/20 0445 12/06/20 0430 12/07/20 0443  NA 135 135 138 137 136 136 135  K 3.6 3.6 3.8 4.5 3.8 3.8 3.7  CL 98 97* 100  --  102 103 103  CO2 29  --  29  --  28 27 25   GLUCOSE 129* 127* 95  --  166* 114* 98  BUN 17 17 17   --  21 17 11   CREATININE 0.90 0.80 0.78  --  0.76 0.66 0.60*  CALCIUM 8.4*  --  8.5*  --  8.2* 7.8* 7.7*  MG  --   --   --   --  1.7 2.3  --    Liver Function Tests: Recent Labs  Lab 12/03/20 0720  AST 19  ALT 14  ALKPHOS 99  BILITOT 1.0  PROT 6.0*  ALBUMIN 3.1*   No results for input(s): LIPASE, AMYLASE in the last 168 hours. No results for input(s): AMMONIA in the last 168 hours. CBC: Recent Labs  Lab 12/03/20 0720 12/03/20 0801 12/04/20 0431 12/04/20 1038 12/05/20 0445 12/05/20 1458 12/06/20 0430 12/06/20 1809 12/07/20 0443  WBC 11.1*  --  5.7  --  8.0  --  6.2  --  5.1  HGB 9.7*   < > 9.6*   < > 8.6* 7.9* 7.2* 8.8* 8.3*  HCT 32.6*   < >  33.6*   < > 27.8* 26.2* 24.1* 29.4* 27.4*  MCV 80.9  --  82.2  --  80.3  --  83.1  --  84.3  PLT 159  --  136*  --  122*  --  121*  --  125*   < > = values in this interval not displayed.   Cardiac Enzymes: No results for input(s): CKTOTAL, CKMB, CKMBINDEX, TROPONINI in the last 168 hours. BNP: Invalid input(s): POCBNP CBG: Recent Labs  Lab 12/04/20 1423  GLUCAP 178*   D-Dimer No results for input(s): DDIMER in the last 72 hours. Hgb A1c No results for input(s): HGBA1C in the last 72 hours. Lipid Profile No results for input(s): CHOL, HDL, LDLCALC, TRIG, CHOLHDL, LDLDIRECT in the last 72 hours. Thyroid function studies No results for input(s): TSH, T4TOTAL, T3FREE, THYROIDAB in the last 72 hours.  Invalid input(s): FREET3 Anemia work up No results for input(s): VITAMINB12, FOLATE, FERRITIN, TIBC, IRON, RETICCTPCT in the last 72 hours. Urinalysis    Component Value Date/Time   COLORURINE YELLOW 12/03/2020 0720    APPEARANCEUR CLEAR 12/03/2020 0720   LABSPEC 1.014 12/03/2020 0720   PHURINE 6.0 12/03/2020 0720   GLUCOSEU NEGATIVE 12/03/2020 0720   GLUCOSEU NEGATIVE 09/29/2019 1125   HGBUR NEGATIVE 12/03/2020 0720   BILIRUBINUR NEGATIVE 12/03/2020 0720   BILIRUBINUR negative 08/01/2016 1419   KETONESUR NEGATIVE 12/03/2020 0720   PROTEINUR NEGATIVE 12/03/2020 0720   UROBILINOGEN 0.2 09/29/2019 1125   NITRITE NEGATIVE 12/03/2020 0720   LEUKOCYTESUR NEGATIVE 12/03/2020 0720   Sepsis Labs Invalid input(s): PROCALCITONIN,  WBC,  LACTICIDVEN Microbiology Recent Results (from the past 240 hour(s))  Resp Panel by RT-PCR (Flu A&B, Covid) Nasopharyngeal Swab     Status: None   Collection Time: 12/03/20  7:20 AM   Specimen: Nasopharyngeal Swab; Nasopharyngeal(NP) swabs in vial transport medium  Result Value Ref Range Status   SARS Coronavirus 2 by RT PCR NEGATIVE NEGATIVE Final    Comment: (NOTE) SARS-CoV-2 target nucleic acids are NOT DETECTED.  The SARS-CoV-2 RNA is generally detectable in upper respiratory specimens during the acute phase of infection. The lowest concentration of SARS-CoV-2 viral copies this assay can detect is 138 copies/mL. A negative result does not preclude SARS-Cov-2 infection and should not be used as the sole basis for treatment or other patient management decisions. A negative result may occur with  improper specimen collection/handling, submission of specimen other than nasopharyngeal swab, presence of viral mutation(s) within the areas targeted by this assay, and inadequate number of viral copies(<138 copies/mL). A negative result must be combined with clinical observations, patient history, and epidemiological information. The expected result is Negative.  Fact Sheet for Patients:  EntrepreneurPulse.com.au  Fact Sheet for Healthcare Providers:  IncredibleEmployment.be  This test is no t yet approved or cleared by the [place] FDA and  has been authorized for detection and/or diagnosis of SARS-CoV-2 by FDA under an Emergency Use Authorization (EUA). This EUA will remain  in effect (meaning this test can be used) for the duration of the COVID-19 declaration under Section 564(b)(1) of the Act, 21 U.S.C.section 360bbb-3(b)(1), unless the authorization is terminated  or revoked sooner.       Influenza A by PCR NEGATIVE NEGATIVE Final   Influenza B by PCR NEGATIVE NEGATIVE Final    Comment: (NOTE) The Xpert Xpress SARS-CoV-2/FLU/RSV plus assay is intended as an aid in the diagnosis of influenza from Nasopharyngeal swab specimens and should not be used as a sole basis for treatment.  Nasal washings and aspirates are unacceptable for Xpert Xpress SARS-CoV-2/FLU/RSV testing.  Fact Sheet for Patients: EntrepreneurPulse.com.au  Fact Sheet for Healthcare Providers: IncredibleEmployment.be  This test is not yet approved or cleared by the [place] FDA and has been authorized for detection and/or diagnosis of SARS-CoV-2 by FDA under an Emergency Use Authorization (EUA). This EUA will remain in effect (meaning this test can be used) for the duration of the COVID-19 declaration under Section 564(b)(1) of the Act, 21 U.S.C. section 360bbb-3(b)(1), unless the authorization is terminated or revoked.  Performed at [facility], [place] [address], [place], [place] [address]    Time spent: 47min  SIGNED:   Marylu Lund, MD  [facility] 12/08/2020, 12:12 PM  If 7PM-7AM, please contact night-coverage

## 2020-12-08 NOTE — Progress Notes (Signed)
Patient given discharge, medication, and follow up instructions, verbalized understanding, IV x 3 and telemetry monitor removed, personal belongings with patient, family to transport home

## 2020-12-09 DIAGNOSIS — Z85818 Personal history of malignant neoplasm of other sites of lip, oral cavity, and pharynx: Secondary | ICD-10-CM | POA: Diagnosis not present

## 2020-12-09 DIAGNOSIS — M159 Polyosteoarthritis, unspecified: Secondary | ICD-10-CM | POA: Diagnosis not present

## 2020-12-09 DIAGNOSIS — K222 Esophageal obstruction: Secondary | ICD-10-CM | POA: Diagnosis not present

## 2020-12-09 DIAGNOSIS — I7143 Infrarenal abdominal aortic aneurysm, without rupture: Secondary | ICD-10-CM | POA: Diagnosis not present

## 2020-12-09 DIAGNOSIS — I110 Hypertensive heart disease with heart failure: Secondary | ICD-10-CM | POA: Diagnosis not present

## 2020-12-09 DIAGNOSIS — G894 Chronic pain syndrome: Secondary | ICD-10-CM | POA: Diagnosis not present

## 2020-12-09 DIAGNOSIS — R1312 Dysphagia, oropharyngeal phase: Secondary | ICD-10-CM | POA: Diagnosis not present

## 2020-12-09 DIAGNOSIS — M545 Low back pain, unspecified: Secondary | ICD-10-CM | POA: Diagnosis not present

## 2020-12-09 DIAGNOSIS — J302 Other seasonal allergic rhinitis: Secondary | ICD-10-CM | POA: Diagnosis not present

## 2020-12-09 DIAGNOSIS — J9621 Acute and chronic respiratory failure with hypoxia: Secondary | ICD-10-CM | POA: Diagnosis not present

## 2020-12-09 DIAGNOSIS — I2510 Atherosclerotic heart disease of native coronary artery without angina pectoris: Secondary | ICD-10-CM | POA: Diagnosis not present

## 2020-12-09 DIAGNOSIS — E785 Hyperlipidemia, unspecified: Secondary | ICD-10-CM | POA: Diagnosis not present

## 2020-12-09 DIAGNOSIS — I5032 Chronic diastolic (congestive) heart failure: Secondary | ICD-10-CM | POA: Diagnosis not present

## 2020-12-09 DIAGNOSIS — N400 Enlarged prostate without lower urinary tract symptoms: Secondary | ICD-10-CM | POA: Diagnosis not present

## 2020-12-09 DIAGNOSIS — S72002D Fracture of unspecified part of neck of left femur, subsequent encounter for closed fracture with routine healing: Secondary | ICD-10-CM | POA: Diagnosis not present

## 2020-12-09 DIAGNOSIS — D509 Iron deficiency anemia, unspecified: Secondary | ICD-10-CM | POA: Diagnosis not present

## 2020-12-12 ENCOUNTER — Other Ambulatory Visit: Payer: Self-pay | Admitting: Physician Assistant

## 2020-12-12 NOTE — Telephone Encounter (Signed)
Next Visit: Due October 2022. 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    Patient is currently taking Allopurinol 200 mg daily.   Labs: 12/07/2020 RBC 3.25, Hgb 8.3, Hct 27.4, MCH 25.5, RDW 16.3, Platelets 125, Creat. 0.60, calcium 7.7  Okay to refill Allopurinol?

## 2020-12-12 NOTE — Telephone Encounter (Signed)
Please schedule patient for a follow up visit. Patient was due October 2022. Thanks!

## 2020-12-16 ENCOUNTER — Ambulatory Visit: Payer: Medicare Other | Admitting: Physical Medicine and Rehabilitation

## 2020-12-19 DIAGNOSIS — S72031D Displaced midcervical fracture of right femur, subsequent encounter for closed fracture with routine healing: Secondary | ICD-10-CM | POA: Diagnosis not present

## 2020-12-19 NOTE — Telephone Encounter (Signed)
error 

## 2020-12-20 ENCOUNTER — Telehealth: Payer: Self-pay | Admitting: Physical Medicine and Rehabilitation

## 2020-12-20 NOTE — Telephone Encounter (Signed)
Patient had a fall, broke femur, had a total hip replacement. Has had other doctors prescribing pain meds, and would like to get back on track with the correct pain management.  He has an appointment in January and is on the wait list if anything opens up.  Would like advise on what he should be taking.

## 2020-12-23 ENCOUNTER — Telehealth: Payer: Self-pay

## 2020-12-23 MED ORDER — TRAMADOL HCL 50 MG PO TABS: 100.0000 mg | ORAL_TABLET | Freq: Two times a day (BID) | ORAL | 5 refills | Status: DC

## 2020-12-23 MED ORDER — OXYCODONE HCL 5 MG PO TABS: 5.0000 mg | ORAL_TABLET | Freq: Four times a day (QID) | ORAL | 0 refills | Status: DC | PRN

## 2020-12-23 NOTE — Telephone Encounter (Signed)
Brandon [name] fell 3 weeks ago and now has a fracture femur. So, now his left hip has been replaced  by Dr. Lyla Glassing.   In the ED he was given Oxycodone 10 mg. After surgery he was given Oxycodone 20 mg.  He is now able to take one half of the Oxycodone 20 mg with 10-12 hours of pain relief.   Patient is calling for medication advise on his intake of Rx Tramadol and Oxycodone & refills, while he heals.   Call back ph [phone]  PMP Filled  Written  ID  Drug  QTY  Days  Prescriber  RX #  Dispenser  Refill  Daily Dose*  Pymt Type  PMP  12/15/2020 12/15/2020 1  Oxycodone Hcl (Ir) 20 Mg Tab 42.00 7 [place] [id number] [facility] ([id number]) 0/0 180.00 MME Medicare [place] 12/08/2020 12/08/2020 1  Oxycodone Hcl (Ir) 10 Mg Tab 30.00 10 [place] [id number] [facility] ([id number]) 0/0 45.00 MME Medicare [place] 11/22/2020 11/21/2020 1  Tramadol Hcl 50 Mg Tablet 120.00 30 [place] [facility] [id number] [facility] (5935) 0/5 20.00 MME Medicare [place] 11/21/2020 11/21/2020 1  Oxycodone Hcl (Ir) 5 Mg Tablet 120.00 30 Me Lov [id number] [facility] (1410) 0/0 30.00 MME Medicare [place]

## 2020-12-26 ENCOUNTER — Encounter (HOSPITAL_COMMUNITY): Payer: Self-pay | Admitting: Cardiology

## 2020-12-26 ENCOUNTER — Ambulatory Visit (HOSPITAL_COMMUNITY)
Admission: RE | Admit: 2020-12-26 | Discharge: 2020-12-26 | Disposition: A | Discharge status: Home / Self Care | Payer: Medicare Other | Source: Ambulatory Visit | Attending: Cardiology | Admitting: Cardiology

## 2020-12-26 VITALS — BP 110/60 | HR 92 | Wt 172.2 lb

## 2020-12-26 DIAGNOSIS — Z952 Presence of prosthetic heart valve: Secondary | ICD-10-CM | POA: Diagnosis not present

## 2020-12-26 DIAGNOSIS — M353 Polymyalgia rheumatica: Secondary | ICD-10-CM | POA: Diagnosis not present

## 2020-12-26 DIAGNOSIS — Z79899 Other long term (current) drug therapy: Secondary | ICD-10-CM | POA: Diagnosis not present

## 2020-12-26 DIAGNOSIS — I350 Nonrheumatic aortic (valve) stenosis: Secondary | ICD-10-CM | POA: Insufficient documentation

## 2020-12-26 DIAGNOSIS — Z8249 Family history of ischemic heart disease and other diseases of the circulatory system: Secondary | ICD-10-CM | POA: Insufficient documentation

## 2020-12-26 DIAGNOSIS — Z7982 Long term (current) use of aspirin: Secondary | ICD-10-CM | POA: Diagnosis not present

## 2020-12-26 DIAGNOSIS — Z955 Presence of coronary angioplasty implant and graft: Secondary | ICD-10-CM | POA: Diagnosis not present

## 2020-12-26 DIAGNOSIS — I6529 Occlusion and stenosis of unspecified carotid artery: Secondary | ICD-10-CM | POA: Diagnosis not present

## 2020-12-26 DIAGNOSIS — Z7902 Long term (current) use of antithrombotics/antiplatelets: Secondary | ICD-10-CM | POA: Diagnosis not present

## 2020-12-26 DIAGNOSIS — Z953 Presence of xenogenic heart valve: Secondary | ICD-10-CM | POA: Diagnosis not present

## 2020-12-26 DIAGNOSIS — I5032 Chronic diastolic (congestive) heart failure: Secondary | ICD-10-CM

## 2020-12-26 DIAGNOSIS — Z87891 Personal history of nicotine dependence: Secondary | ICD-10-CM | POA: Insufficient documentation

## 2020-12-26 DIAGNOSIS — I110 Hypertensive heart disease with heart failure: Secondary | ICD-10-CM | POA: Insufficient documentation

## 2020-12-26 DIAGNOSIS — Z85818 Personal history of malignant neoplasm of other sites of lip, oral cavity, and pharynx: Secondary | ICD-10-CM | POA: Diagnosis not present

## 2020-12-26 DIAGNOSIS — E785 Hyperlipidemia, unspecified: Secondary | ICD-10-CM | POA: Diagnosis not present

## 2020-12-26 DIAGNOSIS — I2510 Atherosclerotic heart disease of native coronary artery without angina pectoris: Secondary | ICD-10-CM | POA: Insufficient documentation

## 2020-12-26 DIAGNOSIS — I714 Abdominal aortic aneurysm, without rupture, unspecified: Secondary | ICD-10-CM | POA: Insufficient documentation

## 2020-12-26 LAB — CBC
HCT: 32.6 % — ABNORMAL LOW (ref 39.0–52.0)
Hemoglobin: 9.5 g/dL — ABNORMAL LOW (ref 13.0–17.0)
MCH: 24.7 pg — ABNORMAL LOW (ref 26.0–34.0)
MCHC: 29.1 g/dL — ABNORMAL LOW (ref 30.0–36.0)
MCV: 84.9 fL (ref 80.0–100.0)
Platelets: 266 10*3/uL (ref 150–400)
RBC: 3.84 MIL/uL — ABNORMAL LOW (ref 4.22–5.81)
RDW: 15.9 % — ABNORMAL HIGH (ref 11.5–15.5)
WBC: 6.6 10*3/uL (ref 4.0–10.5)
nRBC: 0 % (ref 0.0–0.2)

## 2020-12-26 LAB — BASIC METABOLIC PANEL
Anion gap: 8 (ref 5–15)
BUN: 15 mg/dL (ref 8–23)
CO2: 27 mmol/L (ref 22–32)
Calcium: 9 mg/dL (ref 8.9–10.3)
Chloride: 100 mmol/L (ref 98–111)
Creatinine, Ser: 0.91 mg/dL (ref 0.61–1.24)
GFR, Estimated: >60 mL/min (ref >60)
Glucose, Bld: 142 mg/dL — ABNORMAL HIGH (ref 70–99)
Potassium: 4.7 mmol/L (ref 3.5–5.1)
Sodium: 135 mmol/L (ref 135–145)

## 2020-12-26 LAB — BRAIN NATRIURETIC PEPTIDE: B Natriuretic Peptide: 103.8 pg/mL — ABNORMAL HIGH (ref 0.0–100.0)

## 2020-12-26 NOTE — Patient Instructions (Signed)
EKG done today.  Labs done today. We will contact you only if your labs are abnormal.  No medication changes were made. Please continue all current medications as prescribed.  Your physician recommends that you schedule a follow-up appointment in: 4 months with our NP/[facility] here in our office.   If you have any questions or concerns before your next appointment please send us a message through mychart or call our office at [phone].    TO LEAVE A MESSAGE FOR THE NURSE SELECT OPTION 2, PLEASE LEAVE A MESSAGE INCLUDING: YOUR NAME DATE OF BIRTH CALL BACK NUMBER REASON FOR CALL**this is important as we prioritize the call backs  YOU WILL RECEIVE A CALL BACK THE SAME DAY AS LONG AS YOU CALL BEFORE 4:00 PM   Do the following things EVERYDAY: Weigh yourself in the morning before breakfast. Write it down and keep it in a log. Take your medicines as prescribed Eat low salt foods--Limit salt (sodium) to 2000 mg per day.  Stay as active as you can everyday Limit all fluids for the day to less than 2 liters   At the [facility], you and your health needs are our priority. As part of our continuing mission to provide you with exceptional heart care, we have created designated Provider Care Teams. These Care Teams include your primary Cardiologist (physician) and Advanced Practice Providers (APPs- Physician Assistants and Nurse Practitioners) who all work together to provide you with the care you need, when you need it.   You may see any of the following providers on your designated Care Team at your next follow up: Dr Daniel Bensimhon Dr Dalton [place]Lean Amy Clegg, NP Brittainy Simmons, PA Lauren Kemp, PharmD   Please be sure to bring in all your medications bottles to every appointment.   

## 2020-12-27 ENCOUNTER — Telehealth: Payer: Self-pay

## 2020-12-27 NOTE — Progress Notes (Signed)
Patient ID: EMBRY HUSS, male   DOB: 04-14-1940, 80 y.o.   MRN: [id number] PCP: Elyn Aquas Cardiology: Dr. Aundra Dubin  80 y.o.with history of CAD, HTN, and hyperlipidemia presents for followup.  Patient had described some exertional chest pain and myoview showed a partially reversible inferior defect, so LHC was done in 7/10.  This showed that his proximal RCA was totally occluded.  There were very robust left to right collaterals.  There was also a 50% proximal LAD stenosis.  The patient was managed medically.    Most recent echo in 5/20 showed EF 65-70% with moderate AS and mild AI.     Earlier in 2018, he developed a Strep intermedius liver abscess.  He had a hepatic drain placed and also had a right pleural effusion requiring diuresis.    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  He is now cancer-free.    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 Abdominal US in 2/22 showed stable 4.8 cm AAA.   Echo in 2/22 showed EF 60-65% with mild LVH, normal RV, PASP 45 mmHg, severe AS with mean gradient 50 mmHg and AVA 0.96 cm^2. RHC/LHC was done in preparation for AVR, this showed 95% pLAD stenosis, 60-70% pLCx, and CTO RCA with collaterals.  FFR was negative for LCx, patient had DES to LAD.   Patient had TAVR in 5/22 with 29 mm Edwards Sapien THV.  Echo in 7/22 showed hyperdynamic LV with EF 70-75%, normal RV, bioprosthetic aortic valve with mild AI and higher mean gradient 16 mmHg.  Echo in 10/22 showed EF 60-65%, moderate asymmetric basal septal hypertrophy, normal RV, normal IVC, bioprosthetic AoV s/p TAVR with mean gradient 16 and trivial PVL.   In 10/22, patient had mechanical fall (tripped) and fractured his left femoral neck.  He had left THR in 10/22.    Patient returns today for followup of CAD and aortic stenosis.  Weight is down 14 lbs.  He has been off prednisone since surgery and has significant joint pain. He is not walking much due to pain.  No  chest pain or significant dyspnea.  No lightheadedness.  No orthopnea/PND.    ECG (personally reviewed): NSR, nonspecific T wave flattening  Labs (12/10): HDL 28, LDL 70, K 4.3, creatinine 0.8 Labs (4/11): K 4, creatinine 0.8, LDL 53, HDL 26, LFTs normal Labs (12/11): LDL 74, HDL 27, K 4, creatinine 0.9, LFTs normal, TSH normal Labs (7/12): LDL 87, HDL 33 Labs (9/12): LDL 66, HDL 33, LFTs normal Labs (9/13): LDL 62, HDL 30, K 4.1, creatinine 0.8 Labs (4/14): LDL 88, HDL 27, K 4.4, creatinine 0.8 Labs (4/15): K 4.4, creatinine 0.76 Labs (4/16): LDL 69, HDL 32 Labs (11/16): K 4.3, creatinine 0.76, LDL 68, HDL 31 Labs (1/18): LDL 72 Labs (6/18): K 4.2, creatinine 0.6, LFTs normal, hgb 8.1.  Labs (9/18): K 4, creatinine 0.6, LDL 79, HDL 36 Labs (1/19): K 3.9, creatinine 0.67 Labs (8/21): K 3.7, creatinine 0.78 Labs (2/22): LDL 98 Labs (3/22): K 3.2, creatinine 0.65 Labs (6/22): LDL 77 Labs (7/22): K 3.7,creatinine 0.68 Labs (8/22): K 3.6, creatinine 0.77, hgb 10.7 Labs (11/22): K 3.7, creatinine 0.6, hgb 8.3  Allergies (verified):  1)  ! Celebrex  Past Medical History: 1. Hyperlipidemia: Myalgias with atorvastatin and Crestor. 2. Aortic stenosis.  Echocardiogram in August 2009 showed EF 60%, no regional wall motion abnormalities, mild LVH, moderate focal basal septal hypertrophy, and mild diastolic dysfunction.  There was a very mild aortic stenosis with partially fused left and right coronary cusps and some restricted motion of the aortic valve.  The mean gradient, however, across the aortic valve was only 8 mmHg.  There was also mild left atrial enlargement and normal RV size and function.  Minimal AS on LHC in 7/10.  Echo (7/14) with EF 60-65%, mild LVH, mild AS mean gradient 13 mmHg.  Echo (6/16) with EF 55-60%, mild AS with mean gradient 14 mmHg.  - Echo (6/18): EF 55-60%, mild LVH, moderate AS mean gradient 27 mmHg, mild AI.   - Echo (5/20, [facility]): EF 65-70%, moderate AS, mild AI.   - Echo (2/22): EF 60-65% with mild LVH, normal RV, PASP 45 mmHg, severe AS with mean gradient 50 mmHg and AVA 0.96 cm^2.  - TAVR with 29 mm Edwards Sapien THV in 5/22.  - Echo (7/22): Hyperdynamic LV with EF 70-75%, normal RV, bioprosthetic aortic valve with mild AI and higher mean gradient 16 mmHg. - Echo (10/22): EF 60-65%, moderate asymmetric basal septal hypertrophy, normal RV, normal IVC, bioprosthetic AoV s/p TAVR with mean gradient 16 and trivial PVL. 3. Hypertension. 4. Gastroesophageal reflux disease: h/o esophageal stricture.  5. BPH. 6. Diverticulosis. 7. Smoked until 7/10. 8. CAD: LHC (7/10) showed totally occluded proximal RCA with very robust left to right collaterals, 50% proximal LAD stenosis, EF 65%.   - LHC (2/22): 95% stenosis proximal LAD, 60-70% proximal LCx, totally occluded RCA with collaterals.  Negative FFR of LCx, patient had DES to LAD.   9. Osteoarthritis 10. Colonic polyps 11. h/o TIA 12. 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  This manifested with slurred speech. 13. History of low back pain.  The patient is status post surgical back fusion. 14. History of cholecystectomy.  15. Cataract surgery 16. Prostate cancer: treated 81. Liver abscess: Strep intermedius, 2018.  18. AAA: Noted in 8/18 on CT abdomen, 4.3 cm.  - Abdominal US (10/20): 4.8 cm AAA.  - Abdominal US (2/22): 4.8 cm AAA - CT abdomen 3/22 with 5 cm AAA - CT abdomen (10/22): 5.2 cm AAA 19. Carotid stenosis: carotid dopplers 10/18 with 40-59% RICA stenosis.  - Carotid dopplers (10/20): 50-69% RICA stenosis.   - Carotid dopplers (2/22): Mild BICA stenosis.  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  22. Gout 23. Mechanical fall with left femoral neck fracture and left THR in 10/22.   Family History: Father alive w/ CAD & CABG at [facility] in 2009, had TAVR as well.  Mother died age 22 w/ Alzheimer's disease, hx  of DJD 1 Sibling- Brother alive & well w/ DJD in his hips  Social History: Married- 43rd marriage, wife= Etta 2 children from 1st marriage, 4 step children Former smoker, 40+yrs of >1ppd, quit 7/10. Restarted, then quit again in 4/22.  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    Current Outpatient Medications  Medication Sig Dispense Refill   allopurinol (ZYLOPRIM) 100 MG tablet TAKE 2 TABLETS(200 MG) BY MOUTH DAILY 180 tablet 0   amLODipine (NORVASC) 10 MG tablet TAKE 1 TABLET(10 MG) BY MOUTH DAILY 90 tablet 3   aspirin 81 MG chewable tablet Chew 1 tablet (81 mg total) by mouth 2 (two) times daily with a meal. 84 tablet 0   atorvastatin (  LIPITOR) 80 MG tablet Take 1 tablet (80 mg total) by mouth daily. 90 tablet 3   carvedilol (COREG) 12.5 MG tablet Take 1 tablet (12.5 mg total) by mouth 2 (two) times daily. 60 tablet 11   citalopram (CELEXA) 20 MG tablet Take 1 tablet (20 mg total) by mouth daily. 30 tablet 5   clopidogrel (PLAVIX) 75 MG tablet Take 75 mg by mouth daily.     ezetimibe (ZETIA) 10 MG tablet Take 1 tablet (10 mg total) by mouth daily. 30 tablet 11   fluticasone (FLONASE) 50 MCG/ACT nasal spray SHAKE LIQUID AND USE 2 SPRAYS IN EACH NOSTRIL DAILY AS NEEDED FOR RHINITIS OR ALLERGIES 16 g 11   furosemide (LASIX) 20 MG tablet Take 1 tablet (20 mg total) by mouth daily. 90 tablet 3   gabapentin (NEURONTIN) 300 MG capsule TAKE 1 CAPSULE(300 MG) BY MOUTH THREE TIMES DAILY 90 capsule 5   isosorbide mononitrate (IMDUR) 30 MG 24 hr tablet TAKE 1 TABLET(30 MG) BY MOUTH AT BEDTIME 90 tablet 1   lisinopril (ZESTRIL) 20 MG tablet Take 20 mg by mouth daily.     oxyCODONE (ROXICODONE) 5 MG immediate release tablet Take 1 tablet (5 mg total) by mouth every 6 (six) hours as needed for severe pain. 120 tablet 0    pantoprazole (PROTONIX) 40 MG tablet Take 1 tablet (40 mg total) by mouth daily. 90 tablet 3   potassium chloride (KLOR-CON) 10 MEQ tablet Take 2 tablets (20 mEq total) by mouth daily. 180 tablet 3   tamsulosin (FLOMAX) 0.4 MG CAPS capsule Take 0.4 mg by mouth at bedtime.     traMADol (ULTRAM) 50 MG tablet Take 2 tablets (100 mg total) by mouth 2 (two) times daily. 120 tablet 5   No current facility-administered medications for this encounter.    BP 110/60   Pulse 92   Wt 78.1 kg (172 lb 3.2 oz)   SpO2 94%   BMI 27.38 kg/m  General: NAD Neck: No JVD, no thyromegaly or thyroid nodule.  Lungs: Clear to auscultation bilaterally with normal respiratory effort. CV: Nondisplaced PMI.  Heart regular S1/S2, no S3/S4, 1/6 SEM RUSB.  No peripheral edema.  No carotid bruit.  Normal pedal pulses.  Abdomen: Soft, nontender, no hepatosplenomegaly, no distention.  Skin: Intact without lesions or rashes.  Neurologic: Alert and oriented x 3.  Psych: Normal affect. Extremities: No clubbing or cyanosis.  HEENT: Normal.   Assessment/Plan:  1. Aortic stenosis: Patient had TAVR 06/07/20 by left subclavian access given presence of AAA with 29 mm Edwards Sapien THV.  Echo in 10/22 with stable mean gradient 16 mmHg and trivial PVL.  - Valve appears to function normally.  2. CAD: LHC in 2/22 with CTO RCA with collaterals, 95% pLAD treated with DES, 60-70% proximal LCx (FFR negative). No further exertional chest pain since PCI.  - Continue atorvastatin, good lipids in 6/22.  - Continue ASA 81 and Plavix.  CBC today.  3. Hyperlipidemia:  Good lipids in 6/22 as above.  4. HTN: BP not elevated.  - Continue amlodipine and Coreg.  - Continue lisinopril 20 mg daily.  BMET today.  5. Abdominal aortic aneurysm: 5.2 cm by CT in 10/22.   - Plan for AAA repair with Dr. Donzetta Matters on 12/2, but patient would like to postpone due to significant pain post-THR.  6. Carotid stenosis: Mild by 2/22 dopplers.  7. Smoking: He has  quit.   8. Chronic diastolic CHF: He is not volume overloaded on exam.   -  Continue Lasix 20 mg daily, BMET today.    9. Polymyalgia rheumatic: Worsening joint pain.  He needs to ask his surgeon if he can restart prednisone at this point.   Followup in 4 months with APP.    Loralie Champagne 12/27/2020

## 2020-12-27 NOTE — Telephone Encounter (Signed)
Patient reported he broke his hip from a fall and had a left THA 12/04/20 and is still recovering. He would like to postpone his EVAR surgery to January to allow time to recover. Surgery rescheduled for 02/10/21. Updated instructions letter will be mailed to patient. Pt verbalized understanding.

## 2021-01-03 ENCOUNTER — Telehealth: Payer: Self-pay | Admitting: *Deleted

## 2021-01-03 NOTE — Telephone Encounter (Signed)
Brandon [name] is having problems with pain medication and getting his pain under control. He would like a call back from [name] to discuss. [phone].

## 2021-01-05 ENCOUNTER — Telehealth: Payer: Self-pay

## 2021-01-05 MED ORDER — PREDNISONE 5 MG PO TABS: 10.0000 mg | ORAL_TABLET | Freq: Every day | ORAL | 0 refills | Status: DC

## 2021-01-05 NOTE — Telephone Encounter (Signed)
Patient advised it appears that Dr. Courtney Heys sent in a prescription for prednisone this morning. Patient advised we can provide refills for prednisone in the future for PMR.  He has reschedule his appointment for a sooner date. He is scheduled for 01/21/2021. Patient will let us know when he is due for a refill of Prednisone.

## 2021-01-05 NOTE — Telephone Encounter (Signed)
Patient called stating he broke his hip on 12/04/20 and needs a refill of Prednisone.  Patient states he is in a lot of pain and is having difficulty getting out of bed.  Patient states "he needs someone to help him."  Patient requested to speak with [facility] directly.

## 2021-01-05 NOTE — Telephone Encounter (Signed)
Realized that doesn't have Prednisone- was cancelled.   For some reason- PMR is really acting up.  Was on 9 mg daily prior, but asking for 10 mg- ill give him 1 month's supply.   Prednisone sent in  10 mg daily.  Needs to get from Rheumatology in the future. But happy to help at this moment.

## 2021-01-05 NOTE — Telephone Encounter (Signed)
Patient is calling to get a return call from Dr. Dagoberto Ligas. He states that he is having a reaction to the pain medication. He states that he also feels like he has gout all over his body. He wants Dr. Dagoberto Ligas to call him and guide him on what to do.

## 2021-01-05 NOTE — Telephone Encounter (Signed)
It appears that Dr. Courtney Heys sent in a prescription for prednisone this morning.  Please see if the patient needs to be seen for an earlier appointment with [place]?  We can provide refills for prednisone in the future for PMR.    If he has additional questions I can call him this afternoon after seeing my scheduled patients.

## 2021-01-08 DIAGNOSIS — J302 Other seasonal allergic rhinitis: Secondary | ICD-10-CM | POA: Diagnosis not present

## 2021-01-08 DIAGNOSIS — Z85818 Personal history of malignant neoplasm of other sites of lip, oral cavity, and pharynx: Secondary | ICD-10-CM | POA: Diagnosis not present

## 2021-01-08 DIAGNOSIS — M545 Low back pain, unspecified: Secondary | ICD-10-CM | POA: Diagnosis not present

## 2021-01-08 DIAGNOSIS — D509 Iron deficiency anemia, unspecified: Secondary | ICD-10-CM | POA: Diagnosis not present

## 2021-01-08 DIAGNOSIS — E785 Hyperlipidemia, unspecified: Secondary | ICD-10-CM | POA: Diagnosis not present

## 2021-01-08 DIAGNOSIS — I5032 Chronic diastolic (congestive) heart failure: Secondary | ICD-10-CM | POA: Diagnosis not present

## 2021-01-08 DIAGNOSIS — I110 Hypertensive heart disease with heart failure: Secondary | ICD-10-CM | POA: Diagnosis not present

## 2021-01-08 DIAGNOSIS — I7143 Infrarenal abdominal aortic aneurysm, without rupture: Secondary | ICD-10-CM | POA: Diagnosis not present

## 2021-01-08 DIAGNOSIS — I2510 Atherosclerotic heart disease of native coronary artery without angina pectoris: Secondary | ICD-10-CM | POA: Diagnosis not present

## 2021-01-08 DIAGNOSIS — M159 Polyosteoarthritis, unspecified: Secondary | ICD-10-CM | POA: Diagnosis not present

## 2021-01-08 DIAGNOSIS — G894 Chronic pain syndrome: Secondary | ICD-10-CM | POA: Diagnosis not present

## 2021-01-08 DIAGNOSIS — R1312 Dysphagia, oropharyngeal phase: Secondary | ICD-10-CM | POA: Diagnosis not present

## 2021-01-08 DIAGNOSIS — N400 Enlarged prostate without lower urinary tract symptoms: Secondary | ICD-10-CM | POA: Diagnosis not present

## 2021-01-08 DIAGNOSIS — S72002D Fracture of unspecified part of neck of left femur, subsequent encounter for closed fracture with routine healing: Secondary | ICD-10-CM | POA: Diagnosis not present

## 2021-01-08 DIAGNOSIS — J9621 Acute and chronic respiratory failure with hypoxia: Secondary | ICD-10-CM | POA: Diagnosis not present

## 2021-01-08 DIAGNOSIS — K222 Esophageal obstruction: Secondary | ICD-10-CM | POA: Diagnosis not present

## 2021-01-10 ENCOUNTER — Telehealth: Payer: Self-pay

## 2021-01-10 NOTE — Telephone Encounter (Signed)
Patient is calling for an increase in the Oxycodone. He states he is in more severe pain since having the hip surgery. Spoke with patient and he states he has been doubling up on his pain medication due to the pain. He states sometimes he takes 3 at once, 15 mg. He is requesting 15 mg every 6 hours for a little while. Please advise.

## 2021-01-11 ENCOUNTER — Telehealth (HOSPITAL_COMMUNITY): Payer: Self-pay | Admitting: *Deleted

## 2021-01-11 ENCOUNTER — Encounter: Payer: Self-pay | Admitting: *Deleted

## 2021-01-11 ENCOUNTER — Telehealth: Payer: Self-pay | Admitting: *Deleted

## 2021-01-11 ENCOUNTER — Telehealth: Payer: Self-pay

## 2021-01-11 MED ORDER — OXYCODONE HCL 7.5 MG PO TABS: 7.5000 mg | ORAL_TABLET | Freq: Four times a day (QID) | ORAL | 0 refills | Status: DC | PRN

## 2021-01-11 MED ORDER — PREGABALIN 50 MG PO CAPS: 50.0000 mg | ORAL_CAPSULE | Freq: Two times a day (BID) | ORAL | 5 refills | Status: DC

## 2021-01-11 NOTE — Telephone Encounter (Signed)
Prior auth for pregabalin sent to insurance via CoverMyMeds.

## 2021-01-11 NOTE — Telephone Encounter (Signed)
Patient called stating that he is still recovering from his THA and doesn't think he will be ready to have surgery on 02/10/21 as scheduled and wants to delay until 03/03/21. Surgery rescheduled. MD will be made aware. Updated patient instructions letter will be mailed.

## 2021-01-11 NOTE — Telephone Encounter (Signed)
ing 2 pains- nerve pain and hip pain- will change Gabapentin to Lyrica 50 mg BID x 1 week, then 100 mg BID- for nerve pain- if feeling better, can stop Gabapentin-  Will increase Oxycodone to 7.5 mg - can take 1-2 tabs q6 hours prn #180.Since hip surgery 4 weeks ago-

## 2021-01-11 NOTE — Telephone Encounter (Signed)
Approved. Effective from 01/11/2021 through 01/11/2022.  Pharmacy and Mr Jurney notified.

## 2021-01-11 NOTE — Telephone Encounter (Signed)
Pharmacy called stating they can not get Oxycodone 7.5 for patient and is asking if Oxycodone 15 mg can be sent in for patient.

## 2021-01-11 NOTE — Addendum Note (Signed)
Addended by: Courtney Heys on: 01/11/2021 09:52 AM   Modules accepted: Orders

## 2021-01-11 NOTE — Telephone Encounter (Signed)
Pt called reporting he is having dizzy spells. Pts bp over the last two days 93/55,83/51, and 98/55.  Pt wants know if he should make med changes. Pt is currently taking oxycodone and gabapentin since hip replacement along with all other medications.  Routed to [place] for advice

## 2021-01-11 NOTE — Telephone Encounter (Signed)
Stop amlodipine.

## 2021-01-12 MED ORDER — OXYCODONE HCL 10 MG PO TABS: 10.0000 mg | ORAL_TABLET | Freq: Four times a day (QID) | ORAL | 0 refills | Status: DC | PRN

## 2021-01-12 NOTE — Telephone Encounter (Signed)
Pt aware and thanked me for the call.  

## 2021-01-13 NOTE — Progress Notes (Signed)
Office Visit Note  Patient: Brandon [name]             Date of Birth: 06/25/1940           MRN: [id number]             PCP: Haydee Salter, MD Referring: Haydee Salter, MD Visit Date: 01/26/2021 Occupation: @GUAROCC @  Subjective:  Medication management.   History of Present Illness: Brandon [name] is a 80 y.o. male with a history of polymyalgia rheumatica, gout, osteoarthritis and degenerative disc disease.  He states in October he tripped on his dog's bed and fell.  He fractured his left femur.  He had left total hip replacement.  He is gradually recovering from it and is walking without the help of any aid.  He states he will require stent placement in his aortic aneurysm which is planned for last week of January.  He has not had any gout flares.  Activities of Daily Living:  Patient reports morning stiffness for several hours.   Patient Denies nocturnal pain.  Difficulty dressing/grooming: Denies Difficulty climbing stairs: Denies Difficulty getting out of chair: Denies Difficulty using hands for taps, buttons, cutlery, and/or writing: Denies  Review of Systems  Constitutional:  Negative for fatigue.  HENT:  Negative for mouth sores, mouth dryness and nose dryness.   Eyes:  Negative for pain, itching and dryness.  Respiratory:  Negative for shortness of breath and difficulty breathing.   Cardiovascular:  Negative for chest pain and palpitations.  Gastrointestinal:  Negative for blood in stool, constipation and diarrhea.  Endocrine: Negative for increased urination.  Genitourinary:  Negative for difficulty urinating.  Musculoskeletal:  Positive for joint pain, joint pain and morning stiffness. Negative for joint swelling, myalgias, muscle weakness, muscle tenderness and myalgias.  Skin:  Negative for color change, rash and redness.  Allergic/Immunologic: Negative for susceptible to infections.  Neurological:  Positive for memory loss. Negative for dizziness, numbness, headaches  and weakness.  Hematological:  Positive for bruising/bleeding tendency.  Psychiatric/Behavioral:  Negative for confusion.    PMFS History:  Patient Active Problem List   Diagnosis Date Noted   Chronic allergic rhinitis 01/24/2021   Acute blood loss anemia    Demand ischemia (HCC)    Acute on chronic respiratory failure with hypoxia (HCC)    Elevated troponin    Left displaced femoral neck fracture ([place]) 12/03/2020   Fall at home, initial encounter 12/03/2020   Chronic diastolic heart failure ([place]) 12/03/2020   Steatosis of liver 11/01/2020   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   Hepatitis B core antibody positive 11/01/2020   Benign prostatic hyperplasia 11/01/2020   Chronic obstructive pulmonary disease ([place]) 07/20/2020   History of placement of stent in LAD coronary artery 07/20/2020   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   TIA (transient ischemic attack)    Esophageal stricture    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[place]) 04/19/2020   Primary osteoarthritis of both hands 01/11/2020   Piriformis syndrome of both sides 09/23/2019   Pain of both shoulder joints 07/29/2019   Sciatica associated with disorder of lumbar spine 04/24/2019   Primary osteoarthritis involving multiple joints 04/24/2019   Chronic pain syndrome 04/24/2019   Anxiety and depression 04/15/2018   Oropharyngeal dysphagia 03/11/2018   Cancer of tonsillar fossa ([place]) 09/20/2017   Liver abscess 07/10/2016   Aneurysm of infrarenal abdominal aorta 07/10/2016   Hypochromic anemia 07/10/2016   Hypertension 05/19/2013   Osteoarthritis  05/29/2009   Psoriasis 01/13/2009   Coronary artery disease 09/15/2008   Severe aortic stenosis 01/28/2008   Diverticulosis of colon 08/27/2007   History of benign prostatic hyperplasia 08/27/2007   Hyperlipidemia 03/19/2007   Gastroesophageal reflux disease 03/19/2007    Past Medical History:  Diagnosis Date   Abdominal  aneurysm    Arthritis    "all over" (07/19/2016)   BPH (benign prostatic hypertrophy)    CAD (coronary artery disease)    Chicken pox    Chronic lower back pain    s/p surgical fusion   Depression    Diverticulosis    Esophageal stricture    GERD (gastroesophageal reflux disease)    Hepatitis B 1984   Hiatal hernia    History of radiation therapy 01/16/18- 03/05/18   Left Tonsil, 66 Gy in 33 fractions to high risk nodal echelons.    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 Burt Knack and Dr Cyndia Bent    Severe aortic stenosis    TIA (transient ischemic attack) 1990s   hx   tonsillar ca dx'd 11/2017   Tubular adenoma of colon 2009    Family History  Problem Relation Age of Onset   Heart disease Father 4       Living   Coronary artery disease Father        CABG   Alzheimer's disease Mother 56       Deceased   Arthritis Mother    Aneurysm Brother    Stomach cancer Maternal Uncle    Brain cancer Maternal Aunt        x2   Obesity Daughter        Had Bypass Sx   Past Surgical History:  Procedure Laterality Date   BACK SURGERY     CARDIAC CATHETERIZATION     CATARACT EXTRACTION W/ INTRAOCULAR LENS  IMPLANT, BILATERAL Bilateral 01/2012 - 02/2012   CORONARY ATHERECTOMY N/A 04/27/2020   Procedure: CORONARY ATHERECTOMY;  Surgeon: Sherren Mocha, MD;  Location: [facility];  Service: Cardiovascular;  Laterality: N/A;   CORONARY STENT INTERVENTION N/A 04/27/2020   Procedure: CORONARY STENT INTERVENTION;  Surgeon: Sherren Mocha, MD;  Location: [facility];  Service: Cardiovascular;  Laterality: N/A;   DIRECT LARYNGOSCOPY Left 10/09/2017   Procedure: DIRECT LARYNGOSCOPY WITH BOPSY;  Surgeon: Jodi Marble, MD;  Location: [place] [facility];  Service: ENT;  Laterality: Left;    ESOPHAGOGASTRODUODENOSCOPY (EGD) WITH ESOPHAGEAL DILATION     "couple times" (07/19/2016)   ESOPHAGOSCOPY Left 10/09/2017   Procedure: ESOPHAGOSCOPY;  Surgeon: Jodi Marble, MD;  Location: [place];  Service: ENT;  Laterality: Left;   INTRAVASCULAR IMAGING/OCT N/A 04/27/2020   Procedure: INTRAVASCULAR IMAGING/OCT;  Surgeon: Sherren Mocha, MD;  Location: [facility] [facility] [facility];  Service: Cardiovascular;  Laterality: N/A;   INTRAVASCULAR PRESSURE WIRE/FFR STUDY N/A 04/27/2020   Procedure: INTRAVASCULAR PRESSURE WIRE/FFR STUDY;  Surgeon: Sherren Mocha, MD;  Location: [facility];  Service: Cardiovascular;  Laterality: N/A;   IR GASTROSTOMY TUBE MOD SED  01/08/2018   IR THORACENTESIS ASP PLEURAL SPACE W/IMG GUIDE  07/19/2016   LAPAROSCOPIC CHOLECYSTECTOMY  1994   LUMBAR [place] SURGERY  05/1996   L4-5; Dr. Claudean Kinds LAMINECTOMY/DECOMPRESSION MICRODISCECTOMY  10/2002   L3-4. Dr. Sherwood Gambler  MULTIPLE TOOTH EXTRACTIONS  1980s   PARTIAL GLOSSECTOMY  12/02/2017   Dr. Nicolette [facility]   pharyngoplasty for closure of tingue base defect  12/02/2017   Dr. Nicolette Bang- [place]  07/15/2016   POSTERIOR LUMBAR FUSION  09/1996   Ray cage, L4-5 Dr. Rita Ohara   PROSTATE BIOPSY  ~ 2017   radical tonsillectomy Left 12/02/2017   Dr. Nicolette Bang at [facility] N/A 03/31/2020   Procedure: RIGHT HEART CATH AND CORONARY ANGIOGRAPHY;  Surgeon: Larey Dresser, MD;  Location: [place] [facility];  Service: Cardiovascular;  Laterality: N/A;   RIGID BRONCHOSCOPY Left 10/09/2017   Procedure: RIGID BRONCHOSCOPY;  Surgeon: Jodi Marble, MD;  Location: [place];  Service: ENT;  Laterality: Left;   TEE WITHOUT CARDIOVERSION N/A 06/07/2020   Procedure: TRANSESOPHAGEAL ECHOCARDIOGRAM (TEE);  Surgeon: Sherren Mocha, MD;  Location: [place];  Service: Open Heart Surgery;  Laterality: N/A;   TONSILLECTOMY     TOTAL HIP  ARTHROPLASTY Left 12/04/2020   Procedure: TOTAL HIP ARTHROPLASTY ANTERIOR APPROACH;  Surgeon: Rod Can, MD;  Location: [facility];  Service: Orthopedics;  Laterality: Left;   TOTAL HIP ARTHROPLASTY Left 11/2020   TRACHEOSTOMY  12/02/2017   Dr. [facility]- [facility]   ULTRASOUND GUIDANCE FOR VASCULAR ACCESS Right 06/07/2020   Procedure: ULTRASOUND GUIDANCE FOR VASCULAR ACCESS;  Surgeon: Sherren Mocha, MD;  Location: [place];  Service: Open Heart Surgery;  Laterality: Right;   VASCULAR SURGERY     Social History   Social History Narrative   ** Merged History Encounter **       Married (3rd), [place]. 2 children from 1st marriage, 4 step children.    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       Cell # [phone]   Immunization History  Administered Date(s) Administered   Fluad Quad(high Dose 65+) 10/15/2018, 11/06/2019, 10/20/2020   Influenza Split 11/26/2010, 10/11/2011, 11/03/2012   Influenza Whole 11/14/2006, 12/04/2007, 10/06/2008, 10/25/2009   Influenza, High Dose Seasonal PF 11/01/2016, 10/30/2017   Influenza, Seasonal, Injecte, Preservative Fre 12/07/2014   Influenza,inj,Quad PF,6+ Mos 11/04/2015   Influenza-Unspecified 11/19/2013   PFIZER(Purple Top)SARS-COV-2 Vaccination 02/25/2019, 03/18/2019, 11/16/2019   Pneumococcal Conjugate-13 12/07/2014   Pneumococcal Polysaccharide-23 05/30/2017   Tdap 01/28/2020     Objective: Vital Signs: BP 133/70 (BP Location: Left Arm, Patient Position: Sitting, Cuff Size: Normal)   Pulse 63   Ht 5' 6.5" (1.689 m)   Wt 172 lb 9.6 oz (78.3 kg)   BMI 27.44 kg/m    Physical Exam Vitals and nursing note reviewed.  Constitutional:      Appearance: He is well-developed.  HENT:     Head: Normocephalic and atraumatic.  Eyes:     Conjunctiva/sclera: Conjunctivae normal.     Pupils: Pupils are equal, round, and reactive to light.  Cardiovascular:     Rate and  Rhythm: Normal rate and regular rhythm.     Heart sounds: Normal heart sounds.  Pulmonary:     Effort: Pulmonary effort is normal.     Breath sounds: Normal breath sounds.  Abdominal:     General: Bowel sounds are normal.     Palpations: Abdomen is soft.  Musculoskeletal:     Cervical back: Normal range of motion and neck supple.  Skin:    General: Skin is warm and dry.     Capillary  Refill: Capillary refill takes less than 2 seconds.  Neurological:     Mental Status: He is alert and oriented to person, place, and time.  Psychiatric:        Behavior: Behavior normal.     Musculoskeletal Exam: He had limited range of motion of his cervical spine without discomfort.  He had limited range of motion of his lumbar spine.  Shoulder joints, elbow joints, wrist joints with good range of motion.  He had bilateral PIP and DIP thickening.  He had no muscular weakness or tenderness.  Both hip joints with good range of motion without discomfort.  No warmth swelling or effusion was noted in his knee joints.  There was no tenderness over ankles or MTPs.  CDAI Exam: CDAI Score: -- Patient Global: --; Provider Global: -- Swollen: --; Tender: -- Joint Exam 01/26/2021   No joint exam has been documented for this visit   There is currently no information documented on the homunculus. Go to the Rheumatology activity and complete the homunculus joint exam.  Investigation: No additional findings.  Imaging: No results found.  Recent Labs: Lab Results  Component Value Date   WBC 6.6 12/26/2020   HGB 9.5 (L) 12/26/2020   PLT 266 12/26/2020   NA 135 12/26/2020   K 4.7 12/26/2020   CL 100 12/26/2020   CO2 27 12/26/2020   GLUCOSE 142 (H) 12/26/2020   BUN 15 12/26/2020   CREATININE 0.91 12/26/2020   BILITOT 1.0 12/03/2020   ALKPHOS 99 12/03/2020   AST 19 12/03/2020   ALT 14 12/03/2020   PROT 6.0 (L) 12/03/2020   ALBUMIN 3.1 (L) 12/03/2020   CALCIUM 9.0 12/26/2020   GFRAA 107 08/11/2020    QFTBGOLDPLUS NEGATIVE 01/11/2020    Speciality Comments: No specialty comments available.  Procedures:  No procedures performed Allergies: Celebrex [celecoxib] and Tape   Assessment / Plan:     Visit Diagnoses: Polymyalgia rheumatica ([place]) - History of muscle weakness and tenderness, elevated sed rate. Prednisone responsive: He has no muscular weakness or tenderness on my examination today.  His polymyalgia rheumatica is well controlled.  He was hesitant to taper prednisone in the past.  We had detailed discussion regarding tapering prednisone today.  He was in agreement.  We will taper prednisone by 1 mg every 2 months.  High risk medication use -patient does not wish to be on methotrexate or any other immunosuppressive agent.  Long term (current) use of systemic steroids - prednisone 10 mg 1 tablet by mouth daily.  Side effects of prednisone were again discussed.  We will taper prednisone by 1 mg every 2 months.  Primary osteoarthritis of both hands -he had bilateral PIP and DIP thickening with no synovitis.  Joint protection was discussed.  He has positive ANA but no clinical features of autoimmune disease.  ENA panel was negative.  Status post total hip replacement, left - December 04, 2020.  Patient tripped and fell in October.  He acquired left hip fracture and underwent left hip arthroplasty.  He has been gradually recovering from it.  DDD (degenerative disc disease), lumbar - Status post fusion by Dr. Sherwood Gambler. Chronic pain.  Followed by pain management.  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  He is on allopurinol 200 mg p.o. daily.  His last uric acid on September 08, 2020 was 5.5.  He has not had any gout flare.  Osteopenia  of multiple sites - July 21, 2020 DEXA scan showed T score -2.2 in the right femoral neck, BMD 0.638 -I reviewed DEXA  scan findings with the patient again today.  With his recent fracture and ongoing use of prednisone I emphasized the need for taking a bisphosphonate.  He was in agreement.  He is edentulous.  He denies any history of reflux.  Side effects of Fosamax were discussed at length.  We will call in a prescription for Fosamax starting at 70 mg p.o. weekly.  Information and instructions were placed in the AVS.  I will also obtain some baseline labs today.  Plan: Parathyroid hormone, intact (no Ca), VITAMIN D 25 Hydroxy (Vit-D Deficiency, Fractures), Phosphorus, Serum protein electrophoresis with reflex  Vitamin D deficiency - Plan: VITAMIN D 25 Hydroxy (Vit-D Deficiency, Fractures).  Use of calcium rich diet and vitamin D was discussed.  Chronic pain syndrome - Followed by pain management.  He takes roxicodone as needed for pain relief.   Psoriasis - Patient developed psoriasis 15 years ago which lasted for 1 year without any recurrence.  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  Patient states that he does not have any difficulty swallowing or dysphagia now.  If he has difficulty swallowing Fosamax then we may have to switch him to IV Reclast.  Cancer of tonsillar fossa ([place]) - Treated with surgery and radiation therapy in 2019.  Essential hypertension-his blood pressure was normal today.  History of aortic valve replacement - Performed by Dr. Cyndia Bent and Dr. Danise Edge TAVR on 06/07/20.  He was advised to continue DAPT with aspirin and plavix.  Aneurysm of infrarenal abdominal aorta, unspecified whether ruptured - Followed by Dr. Donzetta Matters. Pre TAVR CT showed large infrarenal abdominal aortic aneurysm which is increasing in size.   History of hyperlipidemia  Liver abscess - In 2018 due to diverticulitis.  Treated with IV antibiotics over 3 weeks.  History of diverticulitis - Followed by Dr. Fuller Plan.  History of gastroesophageal reflux (GERD)  Anxiety and  depression  Former smoker - one pack per day for 50 years.  Orders: Orders Placed This Encounter  Procedures   Parathyroid hormone, intact (no Ca)   VITAMIN D 25 Hydroxy (Vit-D Deficiency, Fractures)   Phosphorus   Serum protein electrophoresis with reflex   TSH   Meds ordered this encounter  Medications   alendronate (FOSAMAX) 70 MG tablet    Sig: Take 1 tablet (70 mg total) by mouth once a week. Take with a full glass of water on an empty stomach.    Dispense:  12 tablet    Refill:  0   predniSONE (DELTASONE) 5 MG tablet    Sig: Take 1 tablet (5 mg total) by mouth daily with breakfast.    Dispense:  90 tablet    Refill:  0   predniSONE (DELTASONE) 1 MG tablet    Sig: Take 4 tablets by mouth daily along with a 5mg  tab for a total of 9mg  daily for 2 months. Then reduce to 8mg  by mouth daily.    Dispense:  240 tablet    Refill:  0     Follow-Up Instructions: Return in about 6 weeks (around 03/09/2021) for Polymyalgia rheumatica.   Bo Merino, MD  Note - This record has been created using Editor, commissioning.  Chart creation errors have been sought, but may not always  have been located. Such creation errors do not reflect on  the standard of medical care.

## 2021-01-16 ENCOUNTER — Telehealth (HOSPITAL_COMMUNITY): Payer: Self-pay | Admitting: *Deleted

## 2021-01-16 NOTE — Telephone Encounter (Signed)
Pt called stating yesterday after lunch until bedtime he was very dizzy. Pt checked his bp and it was 114/58. Pt said he feels fine today and his bp is 110/62. Pt said dizziness has been much better since stopping amlodipine but yesterday was a bad day.pt wants to know if he needs to make any other changes.  Routed to [place]

## 2021-01-16 NOTE — Telephone Encounter (Signed)
No changes if feels better today and BP is not low.

## 2021-01-17 DIAGNOSIS — S72031D Displaced midcervical fracture of right femur, subsequent encounter for closed fracture with routine healing: Secondary | ICD-10-CM | POA: Diagnosis not present

## 2021-01-20 ENCOUNTER — Other Ambulatory Visit: Payer: Self-pay | Admitting: Physical Medicine and Rehabilitation

## 2021-01-24 ENCOUNTER — Encounter: Payer: Self-pay | Admitting: Family Medicine

## 2021-01-24 ENCOUNTER — Other Ambulatory Visit: Payer: Self-pay

## 2021-01-24 ENCOUNTER — Ambulatory Visit (INDEPENDENT_AMBULATORY_CARE_PROVIDER_SITE_OTHER): Payer: Medicare Other | Admitting: Family Medicine

## 2021-01-24 VITALS — BP 118/66 | HR 64 | Temp 97.3°F | Ht 66.5 in | Wt 172.8 lb

## 2021-01-24 DIAGNOSIS — Z8781 Personal history of (healed) traumatic fracture: Secondary | ICD-10-CM

## 2021-01-24 DIAGNOSIS — J449 Chronic obstructive pulmonary disease, unspecified: Secondary | ICD-10-CM | POA: Diagnosis not present

## 2021-01-24 DIAGNOSIS — Z9889 Other specified postprocedural states: Secondary | ICD-10-CM | POA: Diagnosis not present

## 2021-01-24 DIAGNOSIS — J309 Allergic rhinitis, unspecified: Secondary | ICD-10-CM | POA: Diagnosis not present

## 2021-01-24 DIAGNOSIS — I7143 Infrarenal abdominal aortic aneurysm, without rupture: Secondary | ICD-10-CM

## 2021-01-24 DIAGNOSIS — I2510 Atherosclerotic heart disease of native coronary artery without angina pectoris: Secondary | ICD-10-CM

## 2021-01-24 MED ORDER — IPRATROPIUM BROMIDE 0.03 % NA SOLN: 2.0000 | Freq: Two times a day (BID) | NASAL | 12 refills | Status: DC

## 2021-01-24 MED ORDER — SPIRIVA RESPIMAT 2.5 MCG/ACT IN AERS: 2.0000 | INHALATION_SPRAY | Freq: Every day | RESPIRATORY_TRACT | 6 refills | Status: DC

## 2021-01-24 MED ORDER — ALBUTEROL SULFATE HFA 108 (90 BASE) MCG/ACT IN AERS: 2.0000 | INHALATION_SPRAY | Freq: Four times a day (QID) | RESPIRATORY_TRACT | 2 refills | Status: DC | PRN

## 2021-01-24 NOTE — Progress Notes (Signed)
[facility] [address] [place] [address] Dept: [phone] Dept Fax: [phone]  Office Visit  Subjective:    Patient ID: Brandon [name], male    DOB: February 14, 1940, 80 y.o..   MRN: [id number]  Chief Complaint  Patient presents with   Acute Visit    C/o having sinus drainage, LT ear feels stopped up/pressure x 1 week.  Taken /musinex with no relief.     History of Present Illness:  Patient is in today for evaluation of chronic rhinorrhea and ear congestion. Mr. 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 He notes that since the time of his surgery, he has had recurrent issues with his left ear not equalizing well. Sometimes, he can pop his ear, but blowing against an obstructed nose, but other times this does not work. He notes that he frequently has issues with thick mucous that he blows from the nose. At times, he has post-nasal drainage of this same thick mucous. He does use nasal saline washes to try and help. He notes that periodically, he has wheezing and then coughs up thick mucous. He has a history of COPD. I had previously prescribed for him an albuterol inhaler and Spiriva, though he has been out of the Spiriva for some time. He does use Flonase for his nose.  Mr. Forrester had a fall in late Oct. and fractured his left femur. He underwent total hip arthroplasty. He notes this has healed well and is is getting around much better. His fall was due to tripping over a dog bed at night.  Mr. Stage has a history of an abdominal aortic aneurysm. He is scheduled for an endovascular graft of this in late January.  Mr. Kerlin has a history of coronary artery disease. He is on multiple medications for this. He notes his cardiologist stopped his Imdur due to orthostatic symptoms. He is feeling better off the medication and denies any angina symptoms.  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   Hepatitis B core antibody positive 11/01/2020   Benign prostatic hyperplasia 11/01/2020   Chronic obstructive pulmonary disease ([place]) 07/20/2020   History of placement of stent in LAD coronary artery 07/20/2020   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   TIA (transient ischemic attack)    Esophageal stricture    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   Primary osteoarthritis of both hands 01/11/2020   Piriformis syndrome of both sides 09/23/2019   Pain of both shoulder joints 07/29/2019   Sciatica associated with disorder of lumbar spine 04/24/2019   Primary osteoarthritis involving multiple joints 04/24/2019   Chronic pain syndrome 04/24/2019   Anxiety and depression 04/15/2018   Oropharyngeal dysphagia 03/11/2018   Cancer of tonsillar fossa ([place]) 09/20/2017   Liver abscess 07/10/2016   Aneurysm of infrarenal abdominal aorta 07/10/2016   Hypochromic anemia 07/10/2016   Hypertension 05/19/2013   Osteoarthritis 05/29/2009   Psoriasis 01/13/2009   Coronary  artery disease 09/15/2008   Severe aortic stenosis 01/28/2008   Diverticulosis of colon 08/27/2007   History of benign prostatic hyperplasia 08/27/2007   Hyperlipidemia 03/19/2007   Gastroesophageal reflux disease 03/19/2007   Past Surgical History:  Procedure Laterality Date   BACK SURGERY     CARDIAC CATHETERIZATION     CATARACT EXTRACTION W/ INTRAOCULAR LENS  IMPLANT, BILATERAL Bilateral 01/2012 - 02/2012   CORONARY ATHERECTOMY  N/A 04/27/2020   Procedure: CORONARY ATHERECTOMY;  Surgeon: Sherren Mocha, MD;  Location: [facility];  Service: Cardiovascular;  Laterality: N/A;   CORONARY STENT INTERVENTION N/A 04/27/2020   Procedure: CORONARY STENT INTERVENTION;  Surgeon: Sherren Mocha, MD;  Location: [facility];  Service: Cardiovascular;  Laterality: N/A;   DIRECT LARYNGOSCOPY Left 10/09/2017   Procedure: DIRECT LARYNGOSCOPY WITH BOPSY;  Surgeon: Jodi Marble, MD;  Location: [facility];  Service: ENT;  Laterality: Left;   ESOPHAGOGASTRODUODENOSCOPY (EGD) WITH ESOPHAGEAL DILATION     "couple times" (07/19/2016)   ESOPHAGOSCOPY Left 10/09/2017   Procedure: ESOPHAGOSCOPY;  Surgeon: Jodi Marble, MD;  Location: [facility];  Service: ENT;  Laterality: Left;   INTRAVASCULAR IMAGING/OCT N/A 04/27/2020   Procedure: INTRAVASCULAR IMAGING/OCT;  Surgeon: Sherren Mocha, MD;  Location: [facility];  Service: Cardiovascular;  Laterality: N/A;   INTRAVASCULAR PRESSURE WIRE/FFR STUDY N/A 04/27/2020   Procedure: INTRAVASCULAR PRESSURE WIRE/FFR STUDY;  Surgeon: Sherren Mocha, MD;  Location: [facility];  Service: Cardiovascular;  Laterality: N/A;   IR GASTROSTOMY TUBE MOD SED  01/08/2018   IR THORACENTESIS ASP PLEURAL SPACE W/IMG GUIDE  07/19/2016   LAPAROSCOPIC CHOLECYSTECTOMY  1994   LUMBAR [place] SURGERY  05/1996   L4-5; Dr. Claudean Kinds LAMINECTOMY/DECOMPRESSION MICRODISCECTOMY  10/2002   L3-4. Dr. Sherwood Gambler   MULTIPLE TOOTH EXTRACTIONS  1980s   PARTIAL GLOSSECTOMY  12/02/2017   Dr. Nicolette [facility]- [facility]   pharyngoplasty for closure of tingue base defect  12/02/2017   Dr. Nicolette Bang- [place]  07/15/2016   POSTERIOR LUMBAR FUSION  09/1996   Ray cage, L4-5 Dr. Rita Ohara   PROSTATE BIOPSY  ~ 2017   radical tonsillectomy Left 12/02/2017   Dr. Nicolette Bang at [place] N/A 03/31/2020   Procedure: RIGHT HEART CATH AND CORONARY  ANGIOGRAPHY;  Surgeon: Larey Dresser, MD;  Location: [facility];  Service: Cardiovascular;  Laterality: N/A;   RIGID BRONCHOSCOPY Left 10/09/2017   Procedure: RIGID BRONCHOSCOPY;  Surgeon: Jodi Marble, MD;  Location: [facility];  Service: ENT;  Laterality: Left;   TEE WITHOUT CARDIOVERSION N/A 06/07/2020   Procedure: TRANSESOPHAGEAL ECHOCARDIOGRAM (TEE);  Surgeon: Sherren Mocha, MD;  Location: [facility];  Service: Open Heart Surgery;  Laterality: N/A;   TONSILLECTOMY     TOTAL HIP ARTHROPLASTY Left 12/04/2020   Procedure: TOTAL HIP ARTHROPLASTY ANTERIOR APPROACH;  Surgeon: Rod Can, MD;  Location: [facility];  Service: Orthopedics;  Laterality: Left;   TRACHEOSTOMY  12/02/2017   Dr. Nicolette Bang- [facility]   ULTRASOUND GUIDANCE FOR VASCULAR ACCESS Right 06/07/2020   Procedure: ULTRASOUND GUIDANCE FOR VASCULAR ACCESS;  Surgeon: Sherren Mocha, MD;  Location: [facility];  Service: Open Heart Surgery;  Laterality: Right;   VASCULAR SURGERY     Family History  Problem Relation Age of Onset   Heart disease Father 22       Living   Coronary artery disease Father        CABG  Alzheimer's disease Mother 41       Deceased   Arthritis Mother    Aneurysm Brother    Stomach cancer Maternal Uncle    Brain cancer Maternal Aunt        x2   Obesity Daughter        Had Bypass Sx   Outpatient Medications Prior to Visit  Medication Sig Dispense Refill   allopurinol (ZYLOPRIM) 100 MG tablet TAKE 2 TABLETS(200 MG) BY MOUTH DAILY 180 tablet 0   atorvastatin (LIPITOR) 80 MG tablet Take 1 tablet (80 mg total) by mouth daily. 90 tablet 3   carvedilol (COREG) 12.5 MG tablet Take 1 tablet (12.5 mg total) by mouth 2 (two) times daily. 60 tablet 11   citalopram (CELEXA) 20 MG tablet TAKE 1 TABLET(20 MG) BY MOUTH DAILY 30 tablet 5   clopidogrel (PLAVIX) 75 MG tablet Take 75 mg by mouth daily.     ezetimibe (ZETIA) 10 MG tablet Take 1 tablet (10 mg total) by mouth daily. 30 tablet 11   fluticasone  (FLONASE) 50 MCG/ACT nasal spray SHAKE LIQUID AND USE 2 SPRAYS IN EACH NOSTRIL DAILY AS NEEDED FOR RHINITIS OR ALLERGIES 16 g 11   furosemide (LASIX) 20 MG tablet Take 1 tablet (20 mg total) by mouth daily. 90 tablet 3   gabapentin (NEURONTIN) 300 MG capsule TAKE 1 CAPSULE(300 MG) BY MOUTH THREE TIMES DAILY 90 capsule 5   lisinopril (ZESTRIL) 20 MG tablet Take 20 mg by mouth daily.     oxyCODONE 10 MG TABS Take 1-1.5 tablets (10-15 mg total) by mouth every 6 (six) hours as needed for severe pain. To replace Oxy 7.5- will not send in 15 mg 120 tablet 0   pantoprazole (PROTONIX) 40 MG tablet Take 1 tablet (40 mg total) by mouth daily. 90 tablet 3   potassium chloride (KLOR-CON) 10 MEQ tablet Take 2 tablets (20 mEq total) by mouth daily. 180 tablet 3   predniSONE (DELTASONE) 5 MG tablet Take 2 tablets (10 mg total) by mouth daily with breakfast. 60 tablet 0   pregabalin (LYRICA) 50 MG capsule Take 1 capsule (50 mg total) by mouth 2 (two) times daily. X 1 week- then 100 mg 2x/day- for nerve pain- to try and improve nerve pain 120 capsule 5   tamsulosin (FLOMAX) 0.4 MG CAPS capsule Take 0.4 mg by mouth at bedtime.     traMADol (ULTRAM) 50 MG tablet Take 2 tablets (100 mg total) by mouth 2 (two) times daily. 120 tablet 5   isosorbide mononitrate (IMDUR) 30 MG 24 hr tablet TAKE 1 TABLET(30 MG) BY MOUTH AT BEDTIME (Patient not taking: Reported on 01/24/2021) 90 tablet 1   No facility-administered medications prior to visit.   Allergies  Allergen Reactions   Celebrex [Celecoxib] Hives and Itching   Tape     Other reaction(s): Other (See Comments) Tears skin. Prefers paper tape   Objective:   Today's Vitals   01/24/21 1111  BP: 118/66  Pulse: 64  Temp: (!) 97.3 F (36.3 C)  TempSrc: Temporal  SpO2: 98%  Weight: 172 lb 12.8 oz (78.4 kg)  Height: 5' 6.5" (1.689 m)   Body mass index is 27.47 kg/m.   General: Well developed, well nourished. No acute distress. HEENT: External ears normal.  left EAC and TM normal. Mucous membranes moist. Oropharynx clear. Neck: Supple. Muscular defect and old scarring present. No masses noted. No lymphadenopathy. No   thyromegaly. Psych: Alert and oriented. Normal mood and affect.  Health  Maintenance Due  Topic Date Due   Zoster Vaccines- Shingrix (1 of 2) Never done   COVID-19 Vaccine (4 - Booster for Pfizer series) 01/11/2020   COLONOSCOPY (Pts 45-72yrs Insurance coverage will need to be confirmed)  03/22/2020     Assessment & Plan:   1. Chronic obstructive pulmonary disease, unspecified COPD type ([place]) We discussed that he will likely always have some sputum production as an aspect of his COPD. He has tried Mucinex int he past without relief of this. I recommend he use a humidifier to help loosen mucous. I will resume his Spiriva and refill his albuterol.  - albuterol (VENTOLIN HFA) 108 (90 Base) MCG/ACT inhaler; Inhale 2 puffs into the lungs every 6 (six) hours as needed for wheezing or shortness of breath.  Dispense: 8 g; Refill: 2 - Tiotropium Bromide Monohydrate (SPIRIVA RESPIMAT) 2.5 MCG/ACT AERS; Inhale 2 puffs into the lungs daily.  Dispense: 4 g; Refill: 6  2. Chronic allergic rhinitis I had previously placed Mr. Gaba on [place]. I will add an Atrovent nasal spray to try and reduce the rhinorrhea.  - ipratropium (ATROVENT) 0.03 % nasal spray; Place 2 sprays into both nostrils every 12 (twelve) hours.  Dispense: 30 mL; Refill: 12  3. Status post-operative repair of closed hip fracture Doing well at this point.  4. Aneurysm of infrarenal abdominal aorta, unspecified whether ruptured Surgery pending next month.  5. Coronary artery disease involving native coronary artery of native heart without angina pectoris Stable off of Imdur.  Haydee Salter, MD

## 2021-01-26 ENCOUNTER — Encounter: Payer: Self-pay | Admitting: Rheumatology

## 2021-01-26 ENCOUNTER — Ambulatory Visit (INDEPENDENT_AMBULATORY_CARE_PROVIDER_SITE_OTHER): Payer: Medicare Other | Admitting: Rheumatology

## 2021-01-26 ENCOUNTER — Other Ambulatory Visit: Payer: Self-pay

## 2021-01-26 VITALS — BP 133/70 | HR 63 | Ht 66.5 in | Wt 172.6 lb

## 2021-01-26 DIAGNOSIS — Z720 Tobacco use: Secondary | ICD-10-CM

## 2021-01-26 DIAGNOSIS — Z7952 Long term (current) use of systemic steroids: Secondary | ICD-10-CM

## 2021-01-26 DIAGNOSIS — L409 Psoriasis, unspecified: Secondary | ICD-10-CM

## 2021-01-26 DIAGNOSIS — M19042 Primary osteoarthritis, left hand: Secondary | ICD-10-CM

## 2021-01-26 DIAGNOSIS — M353 Polymyalgia rheumatica: Secondary | ICD-10-CM

## 2021-01-26 DIAGNOSIS — M8589 Other specified disorders of bone density and structure, multiple sites: Secondary | ICD-10-CM | POA: Diagnosis not present

## 2021-01-26 DIAGNOSIS — Z79899 Other long term (current) drug therapy: Secondary | ICD-10-CM | POA: Diagnosis not present

## 2021-01-26 DIAGNOSIS — M51369 Other intervertebral disc degeneration, lumbar region without mention of lumbar back pain or lower extremity pain: Secondary | ICD-10-CM

## 2021-01-26 DIAGNOSIS — Z8719 Personal history of other diseases of the digestive system: Secondary | ICD-10-CM

## 2021-01-26 DIAGNOSIS — E559 Vitamin D deficiency, unspecified: Secondary | ICD-10-CM

## 2021-01-26 DIAGNOSIS — I10 Essential (primary) hypertension: Secondary | ICD-10-CM

## 2021-01-26 DIAGNOSIS — M1A0210 Idiopathic chronic gout, right elbow, without tophus (tophi): Secondary | ICD-10-CM

## 2021-01-26 DIAGNOSIS — C090 Malignant neoplasm of tonsillar fossa: Secondary | ICD-10-CM

## 2021-01-26 DIAGNOSIS — G894 Chronic pain syndrome: Secondary | ICD-10-CM

## 2021-01-26 DIAGNOSIS — Z9889 Other specified postprocedural states: Secondary | ICD-10-CM

## 2021-01-26 DIAGNOSIS — Z96642 Presence of left artificial hip joint: Secondary | ICD-10-CM

## 2021-01-26 DIAGNOSIS — F32A Depression, unspecified: Secondary | ICD-10-CM

## 2021-01-26 DIAGNOSIS — I7143 Infrarenal abdominal aortic aneurysm, without rupture: Secondary | ICD-10-CM

## 2021-01-26 DIAGNOSIS — Z87891 Personal history of nicotine dependence: Secondary | ICD-10-CM

## 2021-01-26 DIAGNOSIS — K750 Abscess of liver: Secondary | ICD-10-CM

## 2021-01-26 DIAGNOSIS — M79641 Pain in right hand: Secondary | ICD-10-CM

## 2021-01-26 DIAGNOSIS — Z8639 Personal history of other endocrine, nutritional and metabolic disease: Secondary | ICD-10-CM

## 2021-01-26 DIAGNOSIS — F419 Anxiety disorder, unspecified: Secondary | ICD-10-CM

## 2021-01-26 DIAGNOSIS — M5136 Other intervertebral disc degeneration, lumbar region: Secondary | ICD-10-CM

## 2021-01-26 DIAGNOSIS — R1312 Dysphagia, oropharyngeal phase: Secondary | ICD-10-CM

## 2021-01-26 DIAGNOSIS — M19041 Primary osteoarthritis, right hand: Secondary | ICD-10-CM

## 2021-01-26 DIAGNOSIS — Z952 Presence of prosthetic heart valve: Secondary | ICD-10-CM

## 2021-01-26 MED ORDER — PREDNISONE 1 MG PO TABS: ORAL_TABLET | ORAL | 0 refills | Status: DC

## 2021-01-26 MED ORDER — ALENDRONATE SODIUM 70 MG PO TABS: 70.0000 mg | ORAL_TABLET | ORAL | 0 refills | Status: DC

## 2021-01-26 MED ORDER — PREDNISONE 5 MG PO TABS: 5.0000 mg | ORAL_TABLET | Freq: Every day | ORAL | 0 refills | Status: DC

## 2021-01-26 NOTE — Patient Instructions (Addendum)
Prednisone instructions:  Take 9mg  by mouth daily for 2 months (one 5mg  tab and four 1mg  tabs) Then reduce to 8mg  by mouth daily.   Alendronate Tablets What is this medication? ALENDRONATE (a LEN droe nate) prevents and treats osteoporosis. It may also be used to treat Paget disease of the bone. It works by Paramedic stronger and less likely to break (fracture). It belongs to a group of medications called bisphosphonates. This medicine may be used for other purposes; ask your health care provider or pharmacist if you have questions. COMMON BRAND NAME(S): Fosamax What should I tell my care team before I take this medication? They need to know if you have any of these conditions: Bleeding disorder Cancer Dental disease Difficulty swallowing Infection (fever, chills, cough, sore throat, pain or trouble passing urine) Kidney disease Low levels of calcium or other minerals in the blood Low red blood cell counts Receiving steroids like dexamethasone or prednisone Stomach or intestine problems Trouble sitting or standing for 30 minutes An unusual or allergic reaction to alendronate, other medications, foods, dyes or preservatives Pregnant or trying to get pregnant Breast-feeding How should I use this medication? Take this medication by mouth with a full glass of water. Take it as directed on the prescription label at the same time every day. Take the dose right after waking up. Do not eat or drink anything before taking it. Do not take it with any other drink except water. Do not chew or crush the tablet. After taking it, do not eat breakfast, drink, or take any other medications or vitamins for at least 30 minutes. Sit or stand up for at least 30 minutes after you take it. Do not lie down. Keep taking it unless your care team tells you to stop. A special MedGuide will be given to you by the pharmacist with each prescription and refill. Be sure to read this information carefully each  time. Talk to your care team about the use of this medication in children. Special care may be needed. Overdosage: If you think you have taken too much of this medicine contact a poison control center or emergency room at once. NOTE: This medicine is only for you. Do not share this medicine with others. What if I miss a dose? If you take your medication once a day, skip it. Take your next dose at the scheduled time the next morning. Do not take two doses on the same day. If you take your medication once a week, take the missed dose on the morning after you remember. Do not take two doses on the same day. What may interact with this medication? Aluminum hydroxide Antacids Aspirin Calcium supplements Medications for inflammation like ibuprofen, naproxen, and others Iron supplements Magnesium supplements Vitamins with minerals This list may not describe all possible interactions. Give your health care provider a list of all the medicines, herbs, non-prescription drugs, or dietary supplements you use. Also tell them if you smoke, drink alcohol, or use illegal drugs. Some items may interact with your medicine. What should I watch for while using this medication? Visit your care team for regular checks on your progress. It may be some time before you see the benefit from this medication. Some people who take this medication have severe bone, joint, or muscle pain. This medication may also increase your risk for jaw problems or a broken thigh bone. Tell your care team right away if you have severe pain in your jaw, bones, joints, or muscles.  Tell you care team if you have any pain that does not go away or that gets worse. Tell your dentist and dental surgeon that you are taking this medication. You should not have major dental surgery while on this medication. See your dentist to have a dental exam and fix any dental problems before starting this medication. Take good care of your teeth while on this  medication. Make sure you see your dentist for regular follow-up appointments. You should make sure you get enough calcium and vitamin D while you are taking this medication. Discuss the foods you eat and the vitamins you take with your care team. You may need blood work done while you are taking this medication. What side effects may I notice from receiving this medication? Side effects that you should report to your care team as soon as possible: Allergic reactions--skin rash, itching, hives, swelling of the face, lips, tongue, or throat Low calcium level--muscle pain or cramps, confusion, tingling, or numbness in the hands or feet Osteonecrosis of the jaw--pain, swelling, or redness in the mouth, numbness of the jaw, poor healing after dental work, unusual discharge from the mouth, visible bones in the mouth Pain or trouble swallowing Severe bone, joint, or muscle pain Stomach bleeding--bloody or black, tar-like stools, vomiting blood or brown material that looks like coffee grounds Side effects that usually do not require medical attention (report to your care team if they continue or are bothersome): Constipation Diarrhea Nausea Stomach pain This list may not describe all possible side effects. Call your doctor for medical advice about side effects. You may report side effects to FDA at 1-800-FDA-1088. Where should I keep my medication? Keep out of the reach of children and pets. Store at room temperature between 15 and 30 degrees C (59 and 86 degrees F). Throw away any unused medication after the expiration date. NOTE: This sheet is a summary. It may not cover all possible information. If you have questions about this medicine, talk to your doctor, pharmacist, or health care provider.  2022 Elsevier/Gold Standard (2020-02-04 00:00:00)

## 2021-01-27 NOTE — Progress Notes (Signed)
PTH is high and phosphorus is low due to vitamin D deficiency.  Please call in vitamin D 50,000 units once a week for 3 months.  After finishing the course of vitamin D prescription he should restart vitamin D 2000 units daily.  Recheck vitamin D level in 3 months.

## 2021-01-31 ENCOUNTER — Other Ambulatory Visit: Payer: Self-pay | Admitting: *Deleted

## 2021-01-31 DIAGNOSIS — E559 Vitamin D deficiency, unspecified: Secondary | ICD-10-CM

## 2021-01-31 MED ORDER — VITAMIN D (ERGOCALCIFEROL) 1.25 MG (50000 UNIT) PO CAPS: 50000.0000 [IU] | ORAL_CAPSULE | ORAL | 0 refills | Status: DC

## 2021-02-01 ENCOUNTER — Telehealth: Payer: Self-pay

## 2021-02-01 DIAGNOSIS — X32XXXA Exposure to sunlight, initial encounter: Secondary | ICD-10-CM | POA: Diagnosis not present

## 2021-02-01 DIAGNOSIS — L570 Actinic keratosis: Secondary | ICD-10-CM | POA: Diagnosis not present

## 2021-02-01 NOTE — Telephone Encounter (Signed)
Patient is calling to request a refill on Oxycodone 10 mg. Per PMP, last fill was 01/12/21.

## 2021-02-02 LAB — VITAMIN D 25 HYDROXY (VIT D DEFICIENCY, FRACTURES): Vit D, 25-Hydroxy: 25 ng/mL — ABNORMAL LOW (ref 30–100)

## 2021-02-02 LAB — PHOSPHORUS: Phosphorus: 1.8 mg/dL — ABNORMAL LOW (ref 2.1–4.3)

## 2021-02-02 LAB — PROTEIN ELECTROPHORESIS, SERUM, WITH REFLEX
Albumin ELP: 3.2 g/dL — ABNORMAL LOW (ref 3.8–4.8)
Alpha 1: 0.5 g/dL — ABNORMAL HIGH (ref 0.2–0.3)
Alpha 2: 0.9 g/dL (ref 0.5–0.9)
Beta 2: 0.3 g/dL (ref 0.2–0.5)
Beta Globulin: 0.4 g/dL (ref 0.4–0.6)
Gamma Globulin: 0.7 g/dL — ABNORMAL LOW (ref 0.8–1.7)
Total Protein: 6 g/dL — ABNORMAL LOW (ref 6.1–8.1)

## 2021-02-02 LAB — PARATHYROID HORMONE, INTACT (NO CA): PTH: 81 pg/mL — ABNORMAL HIGH (ref 16–77)

## 2021-02-02 LAB — IFE INTERPRETATION: Immunofix Electr Int: NOT DETECTED

## 2021-02-02 MED ORDER — OXYCODONE HCL 5 MG PO TABS: 5.0000 mg | ORAL_TABLET | Freq: Four times a day (QID) | ORAL | 0 refills | Status: DC | PRN

## 2021-02-02 NOTE — Addendum Note (Signed)
Addended by: Courtney Heys on: 02/02/2021 11:47 AM   Modules accepted: Orders

## 2021-02-02 NOTE — Telephone Encounter (Signed)
Sinuses draining like crazy  Pain is doing better- not using cane anymore- has 1 week of pain meds left.   Still having cramping pretty bad in back.   Already has 3 surgeries in back.   Wondering if should get shot in low back- has to get new surgeon-  To see if can go back to Oxycodone 5 mg- since doing better.    Can wean Gabapentin- 300 mg BID x 1 week, then 300 mg QHS x 1 week, then stop.   If wants to since it doesn't work so well.   Can break Oxycodone and do 5-10 mg q 6 hours as needed.  But will send new rx for oxy 5 mg q6 hours prn #120 per d/w pt.  1/27 getting another stent for aneurysm- had to put off due to hip fx-

## 2021-02-07 ENCOUNTER — Other Ambulatory Visit: Payer: Self-pay | Admitting: Physical Medicine and Rehabilitation

## 2021-02-16 ENCOUNTER — Telehealth: Payer: Self-pay

## 2021-02-16 MED ORDER — TRAMADOL HCL 50 MG PO TABS: 100.0000 mg | ORAL_TABLET | Freq: Two times a day (BID) | ORAL | 5 refills | Status: DC

## 2021-02-16 MED ORDER — OXYCODONE HCL 10 MG PO TABS
10.0000 mg | ORAL_TABLET | Freq: Four times a day (QID) | ORAL | 0 refills | Status: DC | PRN
Start: 2021-02-16 — End: 2021-03-14

## 2021-02-16 NOTE — Telephone Encounter (Signed)
PMP was Reviewed. UDS was Reviewed.  Educated Brandon [name] on the Narcotic Policy and not to dring alcohol, he verbalizes understanding. [name] Note was Reviewed.  Last Office Visit was in June 2022.  Oxycodone and Tramadol was e-scribed today.  Placed a call to Brandon [name], he verbalizes understanding.

## 2021-02-16 NOTE — Telephone Encounter (Signed)
Patient calling requesting refill on Oxycodone. Last filled #120 on 01/12/21, next appt 02/28/20 with Dr. Dagoberto Ligas. Can you refill medication in Dr. Dagoberto Ligas absence?

## 2021-02-21 ENCOUNTER — Ambulatory Visit: Payer: Medicare Other | Admitting: Rheumatology

## 2021-02-24 NOTE — Progress Notes (Deleted)
Office Visit Note  Patient: Brandon [name]             Date of Birth: [birth year]           MRN: [id number]             PCP: Haydee Salter, MD Referring: Haydee Salter, MD Visit Date: 03/10/2021 Occupation: @GUAROCC @  Subjective:    History of Present Illness: Brandon [name] is a 81 y.o. male with history of polymyalgia rheumatica, gout, osteoarthritis, and DDD.  He has not currently taking any immunosuppressive agents.  He is tapering prednisone by 1 mg every 2 months.   He remains on allopurinol 200 mg daily for management of gout.  He was started on Fosamax 70 mg 1 tablet by mouth once weekly at his last office visit in December 2022.  Activities of Daily Living:  Patient reports morning stiffness for *** {minute/hour:19697}.   Patient {ACTIONS;DENIES/REPORTS:21021675::"Denies"} nocturnal pain.  Difficulty dressing/grooming: {ACTIONS;DENIES/REPORTS:21021675::"Denies"} Difficulty climbing stairs: {ACTIONS;DENIES/REPORTS:21021675::"Denies"} Difficulty getting out of chair: {ACTIONS;DENIES/REPORTS:21021675::"Denies"} Difficulty using hands for taps, buttons, cutlery, and/or writing: {ACTIONS;DENIES/REPORTS:21021675::"Denies"}  No Rheumatology ROS completed.   PMFS History:  Patient Active Problem List   Diagnosis Date Noted   Chronic allergic rhinitis 01/24/2021   Acute blood loss anemia    Demand ischemia (HCC)    Acute on chronic respiratory failure with hypoxia (HCC)    Elevated troponin    Left displaced femoral neck fracture ([place]) 12/03/2020   Fall at home, initial encounter 12/03/2020   Chronic diastolic heart failure ([place]) 12/03/2020   Steatosis of liver 11/01/2020   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   Hepatitis B core antibody positive 11/01/2020   Benign prostatic hyperplasia 11/01/2020   Chronic obstructive pulmonary disease ([place]) 07/20/2020   History of placement of stent in LAD coronary artery 07/20/2020   History of transcatheter aortic valve replacement  (TAVR) 06/07/2020   TIA (transient ischemic attack)    Esophageal stricture    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[place]) 04/19/2020   Primary osteoarthritis of both hands 01/11/2020   Piriformis syndrome of both sides 09/23/2019   Pain of both shoulder joints 07/29/2019   Sciatica associated with disorder of lumbar spine 04/24/2019   Primary osteoarthritis involving multiple joints 04/24/2019   Chronic pain syndrome 04/24/2019   Anxiety and depression 04/15/2018   Oropharyngeal dysphagia 03/11/2018   Cancer of tonsillar fossa ([place]) 09/20/2017   Liver abscess 07/10/2016   Aneurysm of infrarenal abdominal aorta 07/10/2016   Hypochromic anemia 07/10/2016   Hypertension 05/19/2013   Osteoarthritis 05/29/2009   Psoriasis 01/13/2009   Coronary artery disease 09/15/2008   Severe aortic stenosis 01/28/2008   Diverticulosis of colon 08/27/2007   History of benign prostatic hyperplasia 08/27/2007   Hyperlipidemia 03/19/2007   Gastroesophageal reflux disease 03/19/2007    Past Medical History:  Diagnosis Date   Abdominal aneurysm    Arthritis    "all over" (07/19/2016)   BPH (benign prostatic hypertrophy)    CAD (coronary artery disease)    Chicken pox    Chronic lower back pain    s/p surgical fusion   Depression    Diverticulosis    Esophageal stricture    GERD (gastroesophageal reflux disease)    Hepatitis B 1984   Hiatal hernia    History of radiation therapy 01/16/18- 03/05/18   Left Tonsil, 66 Gy in 33 fractions to high risk nodal echelons.    HLD (hyperlipidemia)    HTN (hypertension)  Liver abscess 07/10/2016   Osteoarthritis    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   s/p TAVR with a 29 mm Edwards Sapien 3 via the subclavian approach by Dr. Burt Knack and Dr Cyndia Bent    Severe aortic stenosis    TIA (transient ischemic attack) 1990s   hx   tonsillar ca dx'd 11/2017   Tubular adenoma of colon 2009    Family History   Problem Relation Age of Onset   Heart disease Father 44       Living   Coronary artery disease Father        CABG   Alzheimer's disease Mother 53       Deceased   Arthritis Mother    Aneurysm Brother    Stomach cancer Maternal Uncle    Brain cancer Maternal Aunt        x2   Obesity Daughter        Had Bypass Sx   Past Surgical History:  Procedure Laterality Date   BACK SURGERY     CARDIAC CATHETERIZATION     CATARACT EXTRACTION W/ INTRAOCULAR LENS  IMPLANT, BILATERAL Bilateral 01/2012 - 02/2012   CORONARY ATHERECTOMY N/A 04/27/2020   Procedure: CORONARY ATHERECTOMY;  Surgeon: Sherren Mocha, MD;  Location: [place] [facility];  Service: Cardiovascular;  Laterality: N/A;   CORONARY STENT INTERVENTION N/A 04/27/2020   Procedure: CORONARY STENT INTERVENTION;  Surgeon: Sherren Mocha, MD;  Location: [place] [facility];  Service: Cardiovascular;  Laterality: N/A;   DIRECT LARYNGOSCOPY Left 10/09/2017   Procedure: DIRECT LARYNGOSCOPY WITH BOPSY;  Surgeon: Jodi Marble, MD;  Location: [facility] [place];  Service: ENT;  Laterality: Left;   ESOPHAGOGASTRODUODENOSCOPY (EGD) WITH ESOPHAGEAL DILATION     "couple times" (07/19/2016)   ESOPHAGOSCOPY Left 10/09/2017   Procedure: ESOPHAGOSCOPY;  Surgeon: Jodi Marble, MD;  [facility];  Service: ENT;  Laterality: Left;   INTRAVASCULAR IMAGING/OCT N/A 04/27/2020   Procedure: INTRAVASCULAR IMAGING/OCT;  Surgeon: Sherren Mocha, MD;  Location: [place] [facility];  Service: Cardiovascular;  Laterality: N/A;   INTRAVASCULAR PRESSURE WIRE/FFR STUDY N/A 04/27/2020   Procedure: INTRAVASCULAR PRESSURE WIRE/FFR STUDY;  Surgeon: Sherren Mocha, MD;  Location: [place] [facility];  Service: Cardiovascular;  Laterality: N/A;   IR GASTROSTOMY TUBE MOD SED  01/08/2018   IR THORACENTESIS ASP PLEURAL SPACE W/IMG GUIDE  07/19/2016   LAPAROSCOPIC CHOLECYSTECTOMY  1994   LUMBAR [place] SURGERY  05/1996   L4-5; Dr. Claudean Kinds LAMINECTOMY/DECOMPRESSION MICRODISCECTOMY  10/2002   L3-4. Dr. Sherwood Gambler   MULTIPLE TOOTH EXTRACTIONS  1980s   PARTIAL GLOSSECTOMY  12/02/2017   Dr. Nicolette Bang- [facility]   pharyngoplasty for closure of tingue base defect  12/02/2017   Dr. Nicolette Bang- [place]  07/15/2016   POSTERIOR LUMBAR FUSION  09/1996   Ray cage, L4-5 Dr. Rita Ohara   PROSTATE BIOPSY  ~ 2017   radical tonsillectomy Left 12/02/2017   Dr. Nicolette Bang at [place] N/A 03/31/2020   Procedure: RIGHT HEART CATH AND CORONARY ANGIOGRAPHY;  Surgeon: Larey Dresser, MD;  Location: [place] [facility];  Service: Cardiovascular;  Laterality: N/A;   RIGID BRONCHOSCOPY Left 10/09/2017   Procedure: RIGID BRONCHOSCOPY;  Surgeon: Jodi Marble, MD;  Location: [place];  Service: ENT;  Laterality: Left;   TEE WITHOUT CARDIOVERSION N/A 06/07/2020   Procedure: TRANSESOPHAGEAL ECHOCARDIOGRAM (TEE);  Surgeon: Sherren Mocha, MD;  Location: [place];  Service: Open Heart Surgery;  Laterality: N/A;   TONSILLECTOMY     TOTAL HIP ARTHROPLASTY Left 12/04/2020   Procedure: TOTAL HIP ARTHROPLASTY ANTERIOR APPROACH;  Surgeon: Rod Can, MD;  Location: [facility];  Service: Orthopedics;  Laterality: Left;   TOTAL HIP ARTHROPLASTY Left 11/2020   TRACHEOSTOMY  12/02/2017   Dr. Nicolette [facility]- [facility]   ULTRASOUND GUIDANCE FOR VASCULAR ACCESS Right 06/07/2020   Procedure: ULTRASOUND GUIDANCE FOR VASCULAR ACCESS;  Surgeon: Sherren Mocha, MD;  Location: [place];  Service: Open Heart Surgery;  Laterality: Right;   VASCULAR SURGERY     Social History   Social History Narrative   ** Merged History Encounter **       Married (3rd), [place]. 2 children from 1st marriage, 4 step children.    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       Cell # [id number]   Immunization History  Administered Date(s)  Administered   Fluad Quad(high Dose 65+) 10/15/2018, 11/06/2019, 10/20/2020   Influenza Split 11/26/2010, 10/11/2011, 11/03/2012   Influenza Whole 11/14/2006, 12/04/2007, 10/06/2008, 10/25/2009   Influenza, High Dose Seasonal PF 11/01/2016, 10/30/2017   Influenza, Seasonal, Injecte, Preservative Fre 12/07/2014   Influenza,inj,Quad PF,6+ Mos 11/04/2015   Influenza-Unspecified 11/19/2013   PFIZER(Purple Top)SARS-COV-2 Vaccination 02/25/2019, 03/18/2019, 11/16/2019   Pneumococcal Conjugate-13 12/07/2014   Pneumococcal Polysaccharide-23 05/30/2017   Tdap 01/28/2020     Objective: Vital Signs: There were no vitals taken for this visit.   Physical Exam Vitals and nursing note reviewed.  Constitutional:      Appearance: He is well-developed.  HENT:     Head: Normocephalic and atraumatic.  Eyes:     Conjunctiva/sclera: Conjunctivae normal.     Pupils: Pupils are equal, round, and reactive to light.  Cardiovascular:     Rate and Rhythm: Normal rate and regular rhythm.     Heart sounds: Normal heart sounds.  Pulmonary:     Effort: Pulmonary effort is normal.     Breath sounds: Normal breath sounds.  Abdominal:     General: Bowel sounds are normal.     Palpations: Abdomen is soft.  Musculoskeletal:     Cervical back: Normal range of motion and neck supple.  Skin:    General: Skin is warm and dry.     Capillary Refill: Capillary refill takes less than 2 seconds.  Neurological:     Mental Status: He is alert and oriented to person, place, and time.  Psychiatric:        Behavior: Behavior normal.     Musculoskeletal Exam: ***  CDAI Exam: CDAI Score: -- Patient Global: --; Provider Global: -- Swollen: --; Tender: -- Joint Exam 03/10/2021   No joint exam has been documented for this visit   There is currently no information documented on the homunculus. Go to the Rheumatology activity and complete the homunculus joint exam.  Investigation: No additional  findings.  Imaging: No results found.  Recent Labs: Lab Results  Component Value Date   WBC 6.6 12/26/2020   HGB 9.5 (L) 12/26/2020   PLT 266 12/26/2020   NA 135 12/26/2020   K 4.7 12/26/2020   CL 100 12/26/2020   CO2 27 12/26/2020   GLUCOSE 142 (H) 12/26/2020   BUN 15 12/26/2020   CREATININE 0.91 12/26/2020   BILITOT 1.0 12/03/2020   ALKPHOS 99 12/03/2020   AST 19 12/03/2020   ALT 14 12/03/2020  PROT 6.0 (L) 01/26/2021   ALBUMIN 3.1 (L) 12/03/2020   CALCIUM 9.0 12/26/2020   GFRAA 107 08/11/2020   QFTBGOLDPLUS NEGATIVE 01/11/2020    Speciality Comments:  Fosamax started on January 26, 2021.  If he has dysphagia with Fosamax or any GI intolerance we will have to switch him to Reclast.  Procedures:  No procedures performed Allergies: Celebrex [celecoxib] and Tape   Assessment / Plan:     Visit Diagnoses: No diagnosis found.  Orders: No orders of the defined types were placed in this encounter.  No orders of the defined types were placed in this encounter.   Face-to-face time spent with patient was *** minutes. Greater than 50% of time was spent in counseling and coordination of care.  Follow-Up Instructions: No follow-ups on file.   Earnestine Mealing, CMA  Note - This record has been created using Editor, commissioning.  Chart creation errors have been sought, but may not always  have been located. Such creation errors do not reflect on  the standard of medical care.

## 2021-02-27 ENCOUNTER — Encounter: Payer: Self-pay | Admitting: Physical Medicine and Rehabilitation

## 2021-02-27 ENCOUNTER — Other Ambulatory Visit: Payer: Self-pay

## 2021-02-27 ENCOUNTER — Encounter
Payer: Medicare Other | Attending: Physical Medicine and Rehabilitation | Admitting: Physical Medicine and Rehabilitation

## 2021-02-27 VITALS — BP 158/76 | HR 68 | Temp 99.1°F | Ht 66.5 in | Wt 175.6 lb

## 2021-02-27 DIAGNOSIS — Z5181 Encounter for therapeutic drug level monitoring: Secondary | ICD-10-CM | POA: Insufficient documentation

## 2021-02-27 DIAGNOSIS — Z79891 Long term (current) use of opiate analgesic: Secondary | ICD-10-CM | POA: Insufficient documentation

## 2021-02-27 DIAGNOSIS — M159 Polyosteoarthritis, unspecified: Secondary | ICD-10-CM | POA: Insufficient documentation

## 2021-02-27 DIAGNOSIS — G894 Chronic pain syndrome: Secondary | ICD-10-CM | POA: Insufficient documentation

## 2021-02-27 DIAGNOSIS — M353 Polymyalgia rheumatica: Secondary | ICD-10-CM | POA: Diagnosis not present

## 2021-02-27 NOTE — Progress Notes (Signed)
Surgical Instructions    Your procedure is scheduled on Friday January 27th.  Report to [facility] Main Entrance "A" at 5:30 A.M., then check in with the Admitting office.  Call this number if you have problems the morning of surgery:  [phone]   If you have any questions prior to your surgery date call [phone]: Open Monday-Friday 8am-4pm    Remember:  Do not eat or drink after midnight the night before your surgery      Take these medicines the morning of surgery with A SIP OF WATER allopurinol (ZYLOPRIM) 100 MG tablet atorvastatin (LIPITOR) 80 MG tablet carvedilol (COREG) 12.5 MG tablet citalopram (CELEXA) 20 MG tablet ezetimibe (ZETIA) 10 MG tablet gabapentin (NEURONTIN) 300 MG capsule pantoprazole (PROTONIX) 40 MG tablet predniSONE (DELTASONE) 1 MG tablet pregabalin (LYRICA) 50 MG capsule traMADol (ULTRAM) 50 MG tablet   IF NEEDED  albuterol (VENTOLIN HFA) 108 (90 Base) MCG/ACT inhaler - please bring with you to the hospital fluticasone (FLONASE) 50 MCG/ACT nasal spray ipratropium (ATROVENT) 0.03 % nasal spray - please bring with you to the hospital Oxycodone HCl 10 MG TABS Tiotropium Bromide Monohydrate (SPIRIVA RESPIMAT) 2.5 MCG/ACT AERS- please bring with you to the hospital    Follow your surgeon's instructions on when to stop Aspirin and Plavix.  If no instructions were given by your surgeon then you will need to call the office to get those instructions.     As of today, STOP taking any Aspirin (unless otherwise instructed by your surgeon) Aleve, Naproxen, Ibuprofen, Motrin, Advil, Goody's, BC's, all herbal medications, fish oil, and all vitamins.  After your COVID test   You are not required to quarantine however you are required to wear a well-fitting mask when you are out and around people not in your household.  If your mask becomes wet or soiled, replace with a new one.  Wash your hands often with soap and water for 20 seconds or clean your hands  with an alcohol-based hand sanitizer that contains at least 60% alcohol.  Do not share personal items.  Notify your provider: if you are in close contact with someone who has COVID  or if you develop a fever of 100.4 or greater, sneezing, cough, sore throat, shortness of breath or body aches.           Do not wear jewelry  Do not wear lotions, powders, colognes, or deodorant. Do not shave 48 hours prior to surgery.  Men may shave face and neck. Do not bring valuables to the hospital. DO Not wear nail polish, gel polish, artificial nails, or any other type of covering on natural nails (fingers and toes) If you have artificial nails or gel coating that need to be removed by a nail salon, please have this removed prior to surgery. Artificial nails or gel coating may interfere with anesthesia's ability to adequately monitor your vital signs.             [facility] is not responsible for any belongings or valuables.  Do NOT Smoke (Tobacco/Vaping)  24 hours prior to your procedure  If you use a CPAP at night, you may bring your mask for your overnight stay.   Contacts, glasses, hearing aids, dentures or partials may not be worn into surgery, please bring cases for these belongings   For patients admitted to the hospital, discharge time will be determined by your treatment team.   Patients discharged the day of surgery will not be allowed to drive home,  and someone needs to stay with them for 24 hours.  NO VISITORS WILL BE ALLOWED IN PRE-OP WHERE PATIENTS ARE PREPPED FOR SURGERY.  ONLY 1 SUPPORT PERSON MAY BE PRESENT IN THE WAITING ROOM WHILE YOU ARE IN SURGERY.  IF YOU ARE TO BE ADMITTED, ONCE YOU ARE IN YOUR ROOM YOU WILL BE ALLOWED TWO (2) VISITORS. 1 (ONE) VISITOR MAY STAY OVERNIGHT BUT MUST ARRIVE TO THE ROOM BY 8pm.  Minor children may have two parents present. Special consideration for safety and communication needs will be reviewed on a case by case basis.  Special instructions:     Oral Hygiene is also important to reduce your risk of infection.  Remember - BRUSH YOUR TEETH THE MORNING OF SURGERY WITH YOUR REGULAR TOOTHPASTE   [facility]- Preparing For Surgery  Before surgery, you can play an important role. Because skin is not sterile, your skin needs to be as free of germs as possible. You can reduce the number of germs on your skin by washing with CHG (chlorahexidine gluconate) Soap before surgery.  CHG is an antiseptic cleaner which kills germs and bonds with the skin to continue killing germs even after washing.     Please do not use if you have an allergy to CHG or antibacterial soaps. If your skin becomes reddened/irritated stop using the CHG.  Do not shave (including legs and underarms) for at least 48 hours prior to first CHG shower. It is OK to shave your face.  Please follow these instructions carefully.     Shower the NIGHT BEFORE SURGERY and the MORNING OF SURGERY with CHG Soap.   If you chose to wash your hair, wash your hair first as usual with your normal shampoo. After you shampoo, rinse your hair and body thoroughly to remove the shampoo.  Then [employer] and genitals (private parts) with your normal soap and rinse thoroughly to remove soap.  After that Use CHG Soap as you would any other liquid soap. You can apply CHG directly to the skin and wash gently with a scrungie or a clean washcloth.   Apply the CHG Soap to your body ONLY FROM THE NECK DOWN.  Do not use on open wounds or open sores. Avoid contact with your eyes, ears, mouth and genitals (private parts). Wash Face and genitals (private parts)  with your normal soap.   Wash thoroughly, paying special attention to the area where your surgery will be performed.  Thoroughly rinse your body with warm water from the neck down.  DO NOT shower/wash with your normal soap after using and rinsing off the CHG Soap.  Pat yourself dry with a CLEAN TOWEL.  Wear CLEAN PAJAMAS to bed the night before  surgery  Place CLEAN SHEETS on your bed the night before your surgery  DO NOT SLEEP WITH PETS.   Day of Surgery:  Take a shower with CHG soap. Wear Clean/Comfortable clothing the morning of surgery Do not apply any deodorants/lotions.   Remember to brush your teeth WITH YOUR REGULAR TOOTHPASTE.   Please read over the following fact sheets that you were given.

## 2021-02-27 NOTE — Patient Instructions (Signed)
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 S/p L THR- 12/02/20  Getting AAA fixed this week.   Oxycodone 10 mg q6 hours as needed- last refilled 02/16/21-  2. UDS today- still has opiate contract  3. Con't Tramadol 100 mg BID for restless leg syndrome.   4. Con't Prednisone for PMR-  9 mg daily right now- but hoping to reduce soon. Hands pain better.   5. Con't Lyrica 50 mg BID last refill 12/7  6. Con't Celexa 20 mg daily- last refilled 12/16. Works well for him- helps anger issues.   7.  Con't Gabapentin 500 mg BID- has refills.   8. F/U in 3 months

## 2021-02-27 NOTE — Progress Notes (Signed)
Subjective:    Patient ID: Brandon [name], male    DOB: 1941/01/23, 81 y.o.   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 S/p L THR- 12/02/20   Wearing his old back brace.   Pain- Across low back-  Can just be walking and will flare up- hurts really bad- sometimes back brace helps; sometimes not.   Last had wine 3 days ago-  Has a lot more strength last few weeks.   Had L THR 12/03/20- pain controlled  Has PMR and  On a regimen with Vit D- prednisone because Vit D too low- last Vit D 25-  Thinks hip broke so easy due to low Vit D.   Thinks broke rib on R- really sore when coughs- when tripped I his shop 2-3 weeks ago.   Now more careful when gets up- gets dizzy, so has to be slow.   Dr Aundra Dubin took him off Norvasc- and BP creeping up- BP 158/76.      Pain Inventory Average Pain 4 Pain Right Now 4 My pain is constant, burning, and aching  In the last 24 hours, has pain interfered with the following? General activity 5 Relation with others 5 Enjoyment of life 5 What TIME of day is your pain at its worst? daytime Sleep (in general) Good  Pain is worse with: walking, bending, standing, and some activites Pain improves with: heat/ice and medication Relief from Meds: 8      Family History  Problem Relation Age of Onset   Heart disease Father 45       Living   Coronary artery disease Father        CABG   Alzheimer's disease Mother 27       Deceased   Arthritis Mother    Aneurysm Brother    Stomach cancer Maternal Uncle    Brain cancer Maternal Aunt        x2   Obesity Daughter         Had Bypass Sx   Social History   Socioeconomic History   Marital status: Married    Spouse name: etta   Number of children: 2   Years of education: Not on file   Highest education level: Not on file  Occupational History   Occupation: retired    Fish farm manager: RETIRED    Comment: disabled due to back problems  Tobacco Use   Smoking status: Former    Packs/day: 1.50    Years: 50.00    Pack years: 75.00    Types: Cigarettes    Quit date: 09/05/2017    Years since quitting: 3.4   Smokeless tobacco: Never   Tobacco comments:    smoked less than 1 ppd for 40+ years;   Vaping Use   Vaping Use: Never used  Substance and Sexual Activity   Alcohol use: Yes    Alcohol/week: 0.0 standard drinks    Comment: Occasionally,not a heavy drinker    Drug use: Yes    Types: Oxycodone   Sexual activity: Never  Other Topics Concern   Not on file  Social History Narrative   ** Merged History Encounter **       Married (3rd), [place]. 2 children from 1st marriage, 4 step children.    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       Cell # [phone]   Social Determinants of Health   Financial Resource Strain: Not on file  Food Insecurity: No Food Insecurity   Worried About Running Out of Food in the Last Year: Never true   Ran Out of Food in the Last Year: Never true  Transportation Needs: Not on file  Physical Activity: Not on file  Stress: Not on file  Social Connections: Not on file   Past Surgical History:  Procedure Laterality Date   [place], BILATERAL Bilateral 01/2012 - 02/2012   CORONARY ATHERECTOMY N/A 04/27/2020   Procedure: CORONARY ATHERECTOMY;  Surgeon: Sherren Mocha, MD;  Location: [facility];  Service: Cardiovascular;  Laterality: N/A;   CORONARY STENT INTERVENTION N/A 04/27/2020   Procedure:  CORONARY STENT INTERVENTION;  Surgeon: Sherren Mocha, MD;  Location: [facility];  Service: Cardiovascular;  Laterality: N/A;   DIRECT LARYNGOSCOPY Left 10/09/2017   Procedure: DIRECT LARYNGOSCOPY WITH BOPSY;  Surgeon: Jodi Marble, MD;  Location: [facility];  Service: ENT;  Laterality: Left;   ESOPHAGOGASTRODUODENOSCOPY (EGD) WITH ESOPHAGEAL DILATION     "couple times" (07/19/2016)   ESOPHAGOSCOPY Left 10/09/2017   Procedure: ESOPHAGOSCOPY;  Surgeon: Jodi Marble, MD;  Location: [facility];  Service: ENT;  Laterality: Left;   INTRAVASCULAR IMAGING/OCT N/A 04/27/2020   Procedure: INTRAVASCULAR IMAGING/OCT;  Surgeon: Sherren Mocha, MD;  Location: [facility];  Service: Cardiovascular;  Laterality: N/A;   INTRAVASCULAR PRESSURE WIRE/FFR STUDY N/A 04/27/2020   Procedure: INTRAVASCULAR PRESSURE WIRE/FFR STUDY;  Surgeon: Sherren Mocha, MD;  Location: [facility];  Service: Cardiovascular;  Laterality: N/A;   IR GASTROSTOMY TUBE MOD SED  01/08/2018   IR THORACENTESIS ASP PLEURAL SPACE W/IMG GUIDE  07/19/2016   LAPAROSCOPIC CHOLECYSTECTOMY  1994   LUMBAR [place] SURGERY  05/1996   L4-5; Dr. Claudean Kinds LAMINECTOMY/DECOMPRESSION MICRODISCECTOMY  10/2002   L3-4. Dr. Sherwood Gambler   MULTIPLE TOOTH EXTRACTIONS  1980s   PARTIAL GLOSSECTOMY  12/02/2017   Dr. Nicolette Bang- [facility]   pharyngoplasty for closure of tingue base defect  12/02/2017   Dr. Nicolette Bang- [place] [facility]  07/15/2016   POSTERIOR LUMBAR FUSION  09/1996   Ray cage, L4-5 Dr. Rita Ohara   PROSTATE BIOPSY  ~ 2017   radical tonsillectomy Left 12/02/2017   Dr. Nicolette Bang at [place] N/A 03/31/2020   Procedure: RIGHT HEART CATH AND CORONARY ANGIOGRAPHY;  Surgeon: Larey Dresser, MD;  Location: [facility];  Service: Cardiovascular;  Laterality: N/A;   RIGID BRONCHOSCOPY Left 10/09/2017   Procedure: RIGID BRONCHOSCOPY;  Surgeon:  Jodi Marble, MD;  Location: [facility];  Service: ENT;  Laterality: Left;   TEE WITHOUT CARDIOVERSION N/A 06/07/2020   Procedure: TRANSESOPHAGEAL ECHOCARDIOGRAM (TEE);  Surgeon: Sherren Mocha, MD;  Location: [facility];  Service: Open Heart Surgery;  Laterality: N/A;   TONSILLECTOMY     TOTAL HIP ARTHROPLASTY Left 12/04/2020   Procedure:  TOTAL HIP ARTHROPLASTY ANTERIOR APPROACH;  Surgeon: Rod Can, MD;  Location: [facility];  Service: Orthopedics;  Laterality: Left;   TOTAL HIP ARTHROPLASTY Left 11/2020   TRACHEOSTOMY  12/02/2017   Dr. Nicolette Bang- [facility]   ULTRASOUND GUIDANCE FOR VASCULAR ACCESS Right 06/07/2020   Procedure: ULTRASOUND GUIDANCE FOR VASCULAR ACCESS;  Surgeon: Sherren Mocha, MD;  Location: [facility];  Service: Open Heart Surgery;  Laterality: Right;   VASCULAR SURGERY     Past Medical History:  Diagnosis Date   Abdominal aneurysm    Arthritis    "all over" (07/19/2016)   BPH (benign prostatic hypertrophy)    CAD (coronary artery disease)    Chicken pox    Chronic lower back pain    s/p surgical fusion   Depression    Diverticulosis    Esophageal stricture    GERD (gastroesophageal reflux disease)    Hepatitis B 1984   Hiatal hernia    History of radiation therapy 01/16/18- 03/05/18   Left Tonsil, 66 Gy in 33 fractions to high risk nodal echelons.    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 Burt Knack and Dr Cyndia Bent    Severe aortic stenosis    TIA (transient ischemic attack) 1990s   hx   tonsillar ca dx'd 11/2017   Tubular adenoma of colon 2009   BP (!) 158/76    Pulse 68    Temp 99.1 F (37.3 C)    Ht 5' 6.5" (1.689 m)    Wt 175 lb 9.6 oz (79.7 kg)    BMI 27.92 kg/m   Opioid Risk Score:   Fall Risk Score:  `1  Depression screen PHQ 2/9  Depression screen [facility]  2/9 02/15/2020 02/08/2020 09/29/2019 09/23/2019 07/29/2019 05/27/2019 04/24/2019  Decreased Interest 0 1 0 0 1 1 1   Down, Depressed, Hopeless 1 1 0 0 0 0 0  PHQ - 2 Score 1 2 0 0 1 1 1   Altered sleeping - - 0 - - - 1  Tired, decreased energy - - 1 - - - 1  Change in appetite - - 0 - - - 0  Feeling bad or failure about yourself  - - 0 - - - 1  Trouble concentrating - - 0 - - - 0  Moving slowly or fidgety/restless - - 0 - - - 0  Suicidal thoughts - - 0 - - - 0  PHQ-9 Score - - 1 - - - 4  Difficult doing work/chores - - - - - - -  Some recent data might be hidden    Review of Systems  Musculoskeletal:  Positive for back pain and gait problem.  All other systems reviewed and are negative.     Objective:   Physical Exam  Awake, alert, appropriate, on table, NAD TTP over band across low back with associated muscle spasms.  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 S/p L THR- 12/02/20  Getting AAA fixed this week.   Oxycodone 10 mg q6 hours as needed- last refilled 02/16/21-  2. UDS today- still has opiate contract  3. Con't Tramadol 100 mg BID for restless leg syndrome.   4. Con't Prednisone for PMR-  9 mg daily right now- but hoping to reduce soon. Hands pain better.   5. Con't Lyrica 50 mg BID last refill 12/7  6. Con't Celexa 20 mg daily- last refilled 12/16. Works well for him- helps anger issues.   7.  Con't Gabapentin 500 mg BID- has refills.   8. F/U in 3 months  I spent a total of 21 minutes on total visit- discussing multiple surgeries and chronic pain-

## 2021-02-28 ENCOUNTER — Other Ambulatory Visit: Payer: Self-pay

## 2021-02-28 ENCOUNTER — Encounter (HOSPITAL_COMMUNITY): Payer: Self-pay

## 2021-02-28 ENCOUNTER — Encounter (HOSPITAL_COMMUNITY)
Admission: RE | Admit: 2021-02-28 | Discharge: 2021-02-28 | Disposition: A | Discharge status: Home / Self Care | Payer: Medicare Other | Source: Ambulatory Visit | Attending: Vascular Surgery | Admitting: Vascular Surgery

## 2021-02-28 ENCOUNTER — Other Ambulatory Visit: Payer: Self-pay | Admitting: Vascular Surgery

## 2021-02-28 VITALS — BP 166/76 | HR 66 | Temp 97.4°F | Resp 16 | Ht 66.5 in | Wt 174.5 lb

## 2021-02-28 DIAGNOSIS — Z91048 Other nonmedicinal substance allergy status: Secondary | ICD-10-CM | POA: Diagnosis not present

## 2021-02-28 DIAGNOSIS — Z85828 Personal history of other malignant neoplasm of skin: Secondary | ICD-10-CM | POA: Insufficient documentation

## 2021-02-28 DIAGNOSIS — J449 Chronic obstructive pulmonary disease, unspecified: Secondary | ICD-10-CM | POA: Diagnosis not present

## 2021-02-28 DIAGNOSIS — Z01818 Encounter for other preprocedural examination: Secondary | ICD-10-CM

## 2021-02-28 DIAGNOSIS — I350 Nonrheumatic aortic (valve) stenosis: Secondary | ICD-10-CM | POA: Insufficient documentation

## 2021-02-28 DIAGNOSIS — F418 Other specified anxiety disorders: Secondary | ICD-10-CM | POA: Diagnosis not present

## 2021-02-28 DIAGNOSIS — K219 Gastro-esophageal reflux disease without esophagitis: Secondary | ICD-10-CM | POA: Diagnosis not present

## 2021-02-28 DIAGNOSIS — Z96642 Presence of left artificial hip joint: Secondary | ICD-10-CM | POA: Insufficient documentation

## 2021-02-28 DIAGNOSIS — Z79899 Other long term (current) drug therapy: Secondary | ICD-10-CM | POA: Insufficient documentation

## 2021-02-28 DIAGNOSIS — I5030 Unspecified diastolic (congestive) heart failure: Secondary | ICD-10-CM | POA: Insufficient documentation

## 2021-02-28 DIAGNOSIS — I110 Hypertensive heart disease with heart failure: Secondary | ICD-10-CM | POA: Diagnosis not present

## 2021-02-28 DIAGNOSIS — Z7902 Long term (current) use of antithrombotics/antiplatelets: Secondary | ICD-10-CM | POA: Insufficient documentation

## 2021-02-28 DIAGNOSIS — I9789 Other postprocedural complications and disorders of the circulatory system, not elsewhere classified: Secondary | ICD-10-CM | POA: Diagnosis not present

## 2021-02-28 DIAGNOSIS — E785 Hyperlipidemia, unspecified: Secondary | ICD-10-CM | POA: Diagnosis not present

## 2021-02-28 DIAGNOSIS — I2510 Atherosclerotic heart disease of native coronary artery without angina pectoris: Secondary | ICD-10-CM | POA: Diagnosis not present

## 2021-02-28 DIAGNOSIS — Z955 Presence of coronary angioplasty implant and graft: Secondary | ICD-10-CM | POA: Insufficient documentation

## 2021-02-28 DIAGNOSIS — Z7982 Long term (current) use of aspirin: Secondary | ICD-10-CM | POA: Insufficient documentation

## 2021-02-28 DIAGNOSIS — I714 Abdominal aortic aneurysm, without rupture, unspecified: Secondary | ICD-10-CM

## 2021-02-28 DIAGNOSIS — N400 Enlarged prostate without lower urinary tract symptoms: Secondary | ICD-10-CM | POA: Diagnosis not present

## 2021-02-28 DIAGNOSIS — Z8673 Personal history of transient ischemic attack (TIA), and cerebral infarction without residual deficits: Secondary | ICD-10-CM | POA: Diagnosis not present

## 2021-02-28 DIAGNOSIS — Z923 Personal history of irradiation: Secondary | ICD-10-CM | POA: Insufficient documentation

## 2021-02-28 DIAGNOSIS — I5032 Chronic diastolic (congestive) heart failure: Secondary | ICD-10-CM | POA: Diagnosis not present

## 2021-02-28 DIAGNOSIS — Z952 Presence of prosthetic heart valve: Secondary | ICD-10-CM | POA: Insufficient documentation

## 2021-02-28 DIAGNOSIS — Z981 Arthrodesis status: Secondary | ICD-10-CM | POA: Diagnosis not present

## 2021-02-28 DIAGNOSIS — Z886 Allergy status to analgesic agent status: Secondary | ICD-10-CM | POA: Diagnosis not present

## 2021-02-28 DIAGNOSIS — Z953 Presence of xenogenic heart valve: Secondary | ICD-10-CM | POA: Diagnosis not present

## 2021-02-28 DIAGNOSIS — Z87891 Personal history of nicotine dependence: Secondary | ICD-10-CM | POA: Diagnosis not present

## 2021-02-28 DIAGNOSIS — Y832 Surgical operation with anastomosis, bypass or graft as the cause of abnormal reaction of the patient, or of later complication, without mention of misadventure at the time of the procedure: Secondary | ICD-10-CM | POA: Diagnosis not present

## 2021-02-28 DIAGNOSIS — F32A Depression, unspecified: Secondary | ICD-10-CM | POA: Diagnosis not present

## 2021-02-28 DIAGNOSIS — Z8249 Family history of ischemic heart disease and other diseases of the circulatory system: Secondary | ICD-10-CM | POA: Diagnosis not present

## 2021-02-28 DIAGNOSIS — G894 Chronic pain syndrome: Secondary | ICD-10-CM | POA: Diagnosis not present

## 2021-02-28 DIAGNOSIS — Z01812 Encounter for preprocedural laboratory examination: Secondary | ICD-10-CM | POA: Insufficient documentation

## 2021-02-28 DIAGNOSIS — Z85818 Personal history of malignant neoplasm of other sites of lip, oral cavity, and pharynx: Secondary | ICD-10-CM | POA: Diagnosis not present

## 2021-02-28 DIAGNOSIS — I10 Essential (primary) hypertension: Secondary | ICD-10-CM | POA: Diagnosis not present

## 2021-02-28 DIAGNOSIS — I517 Cardiomegaly: Secondary | ICD-10-CM | POA: Diagnosis not present

## 2021-02-28 DIAGNOSIS — M353 Polymyalgia rheumatica: Secondary | ICD-10-CM | POA: Diagnosis not present

## 2021-02-28 DIAGNOSIS — M069 Rheumatoid arthritis, unspecified: Secondary | ICD-10-CM | POA: Diagnosis not present

## 2021-02-28 DIAGNOSIS — Z9181 History of falling: Secondary | ICD-10-CM | POA: Insufficient documentation

## 2021-02-28 LAB — COMPREHENSIVE METABOLIC PANEL
ALT: 14 U/L (ref 0–44)
AST: 11 U/L — ABNORMAL LOW (ref 15–41)
Albumin: 3.4 g/dL — ABNORMAL LOW (ref 3.5–5.0)
Alkaline Phosphatase: 110 U/L (ref 38–126)
Anion gap: 10 (ref 5–15)
BUN: 13 mg/dL (ref 8–23)
CO2: 32 mmol/L (ref 22–32)
Calcium: 8.9 mg/dL (ref 8.9–10.3)
Chloride: 98 mmol/L (ref 98–111)
Creatinine, Ser: 0.68 mg/dL (ref 0.61–1.24)
GFR, Estimated: >60 mL/min (ref >60)
Glucose, Bld: 124 mg/dL — ABNORMAL HIGH (ref 70–99)
Potassium: 3.4 mmol/L — ABNORMAL LOW (ref 3.5–5.1)
Sodium: 140 mmol/L (ref 135–145)
Total Bilirubin: 0.6 mg/dL (ref 0.3–1.2)
Total Protein: 6.7 g/dL (ref 6.5–8.1)

## 2021-02-28 LAB — URINALYSIS, ROUTINE W REFLEX MICROSCOPIC
Bilirubin Urine: NEGATIVE
Glucose, UA: NEGATIVE mg/dL
Hgb urine dipstick: NEGATIVE
Ketones, ur: NEGATIVE mg/dL
Leukocytes,Ua: NEGATIVE
Nitrite: NEGATIVE
Protein, ur: NEGATIVE mg/dL
Specific Gravity, Urine: 1.008 (ref 1.005–1.030)
pH: 7 (ref 5.0–8.0)

## 2021-02-28 LAB — CBC
HCT: 36.8 % — ABNORMAL LOW (ref 39.0–52.0)
Hemoglobin: 10.3 g/dL — ABNORMAL LOW (ref 13.0–17.0)
MCH: 22.8 pg — ABNORMAL LOW (ref 26.0–34.0)
MCHC: 28 g/dL — ABNORMAL LOW (ref 30.0–36.0)
MCV: 81.4 fL (ref 80.0–100.0)
Platelets: 184 10*3/uL (ref 150–400)
RBC: 4.52 MIL/uL (ref 4.22–5.81)
RDW: 15.1 % (ref 11.5–15.5)
WBC: 7.7 10*3/uL (ref 4.0–10.5)
nRBC: 0 % (ref 0.0–0.2)

## 2021-02-28 LAB — APTT: aPTT: 30 seconds (ref 24–36)

## 2021-02-28 LAB — SURGICAL PCR SCREEN
MRSA, PCR: NEGATIVE
Staphylococcus aureus: NEGATIVE

## 2021-02-28 LAB — PROTIME-INR
INR: 1.1 (ref 0.8–1.2)
Prothrombin Time: 14.1 seconds (ref 11.4–15.2)

## 2021-02-28 LAB — SARS CORONAVIRUS 2 (TAT 6-24 HRS): SARS Coronavirus 2: NEGATIVE

## 2021-02-28 NOTE — Progress Notes (Signed)
PCP - Arlester Marker Cardiologist - Dr. Alto Denver  Chest x-ray - Not indiated EKG - 12/26/20 Stress Test - Years ago followed up with a cardiologist ECHO - 12/04/20 Cardiac Cath - 03/31/20  Sleep Study - Denies  DM - Denies  Blood Thinner Instructions: Plavis last dose 19th Aspirin Instructions: Last dose 19th  COVID TEST-  Done at [facility] this am 02/28/21   Anesthesia review: Yes cardiac history  Patient denies shortness of breath, fever, cough and chest pain at PAT appointment   All instructions explained to the patient, with a verbal understanding of the material. Patient agrees to go over the instructions while at home for a better understanding. Patient also instructed to wear a mask while in public after being tested for COVID-19. The opportunity to ask questions was provided.

## 2021-02-28 NOTE — Progress Notes (Signed)
[place] patient to verify he was still coming to his appt. He stated he was out front waiting for valet parking

## 2021-03-01 NOTE — Anesthesia Preprocedure Evaluation (Addendum)
Anesthesia Evaluation  Patient identified by MRN, date of birth, ID band Patient awake    Reviewed: Allergy & Precautions, H&P , NPO status , Patient's Chart, lab work & pertinent test results, reviewed documented beta blocker date and time   Airway Mallampati: III  TM Distance: >3 FB Neck ROM: Full    Dental no notable dental hx. (+) Edentulous Upper, Edentulous Lower, Dental Advisory Given   Pulmonary COPD,  COPD inhaler, former smoker,    Pulmonary exam normal breath sounds clear to auscultation       Cardiovascular Exercise Tolerance: Good hypertension, Pt. on medications and Pt. on home beta blockers + CAD   Rhythm:Regular Rate:Normal  S/p TAVR   Neuro/Psych Anxiety Depression TIA   GI/Hepatic Neg liver ROS, hiatal hernia, GERD  Medicated,  Endo/Other  negative endocrine ROS  Renal/GU negative Renal ROS  negative genitourinary   Musculoskeletal  (+) Arthritis , Osteoarthritis,    Abdominal   Peds  Hematology  (+) Blood dyscrasia, anemia ,   Anesthesia Other Findings   Reproductive/Obstetrics negative OB ROS                           Anesthesia Physical Anesthesia Plan  ASA: 3  Anesthesia Plan: General   Post-op Pain Management: Tylenol PO (pre-op)   Induction: Intravenous  PONV Risk Score and Plan: 3 and Ondansetron, Dexamethasone and Treatment may vary due to age or medical condition  Airway Management Planned: Oral ETT  Additional Equipment: Arterial line  Intra-op Plan:   Post-operative Plan: Extubation in OR  Informed Consent: I have reviewed the patients History and Physical, chart, labs and discussed the procedure including the risks, benefits and alternatives for the proposed anesthesia with the patient or authorized representative who has indicated his/her understanding and acceptance.     Dental advisory given  Plan Discussed with: CRNA  Anesthesia Plan  Comments: (PAT note by Karoline Caldwell, PA-C: Follows with cardiology for history of aortic stenosis s/p TAVR 06/07/2020 (echo 10/22 with stable mean gradient 16 mmHg and trivial PVL), CAD (LHC in 2/22 with CTO RCA with collaterals, 95% pLAD treated with DES, 60-70% proximal LCx (FFR negative). No further exertional chest pain since PCI.),  HTN, HLD, diastolic CHF.  Last seen by Dr. Aundra Dubin 12/26/2020 and discussed upcoming AAA repair.  Per note, "Plan for AAA repair with Dr. Donzetta Matters on 12/2, but patient would like to postpone due to significant pain post-THR."  Patient did subsequently postpone procedure due to pain.  Patient is s/p THR 10/22 (had fractured left femoral neck secondary to mechanical fall).  History of tonsillar squamous cell cancer diagnosed 2019, s/p radiation and resection.  Review of anesthesia records shows that glide scope was been used for last 2 intubations.  TTE 12/04/2020: 1. Left ventricular ejection fraction, by estimation, is 60 to 65%. The  left ventricle has normal function. The left ventricle has no regional  wall motion abnormalities. Left ventricular diastolic parameters are  consistent with Grade I diastolic  dysfunction (impaired relaxation).  2. Right ventricular systolic function is normal. The right ventricular  size is normal. Tricuspid regurgitation signal is inadequate for assessing  PA pressure.  3. The mitral valve is normal in structure. No evidence of mitral valve  regurgitation. No evidence of mitral stenosis.  4. The aortic valve was not well visualized. Aortic valve regurgitation  is not visualized. Mild aortic valve stenosis. Aortic valve area, by VTI  measures 1.89 cm. Aortic  valve mean gradient measures 17.5 mmHg. Aortic  valve Vmax measures 2.56 m/s.  5. The inferior vena cava is normal in size with greater than 50%  respiratory variability, suggesting right atrial pressure of 3 mmHg.  PCI 04/27/2020: 1. Successful orbital atherectomy and  stenting of severe calcific stenosis in the proximal LAD, procedure guided by OCT imaging, treated with a 4.0 x 12 mm resolute Onyx DES 2. Moderate left circumflex stenosis with negative pressure wire analysis.  Plan: Overnight observation, likely discharge home tomorrow as long as no complications arise. Continue antiplatelet therapy with aspirin and clopidogrel minimum 6 months, plan to proceed with TAVR in staged fashion.  Cath 03/31/2020: 1. Low filling pressures.  2. Preserved cardiac output.  3. Known occluded RCA with robust collaterals from LAD.  4. 95% calcified proximal LAD stenosis (LAD provides RCA collaterals).  5. 60-70% proximal LCx stenosis.   Patient has known severe AS (from echo). I reviewed films with Drs Angelena Form and Burt Knack. PCI-TAVR would be an option, would likely need atherectomy to address LAD and then PCI LCx. SAVR-CABG would also be an option, and may be safer in the long term as any compromise to an LAD stent would affect both LAD and RCA territory. Patient will be seen by cardiac surgeon and get scans for TAVR planning.  )      Anesthesia Quick Evaluation

## 2021-03-01 NOTE — Progress Notes (Signed)
Anesthesia Chart Review:  Follows with cardiology for history of aortic stenosis s/p TAVR 06/07/2020 (echo 10/22 with stable mean gradient 16 mmHg and trivial PVL), CAD (LHC in 2/22 with CTO RCA with collaterals, 95% pLAD treated with DES, 60-70% proximal LCx (FFR negative). No further exertional chest pain since PCI.),  HTN, HLD, diastolic CHF.  Last seen by Dr. Aundra Dubin 12/26/2020 and discussed upcoming AAA repair.  Per note, "Plan for AAA repair with Dr. Donzetta Matters on 12/2, but patient would like to postpone due to significant pain post-THR."  Patient did subsequently postpone procedure due to pain.  Patient is s/p THR 10/22 (had fractured left femoral neck secondary to mechanical fall).  History of tonsillar squamous cell cancer diagnosed 2019, s/p radiation and resection.  Review of anesthesia records shows that glide scope was been used for last 2 intubations.  TTE 12/04/2020:  1. Left ventricular ejection fraction, by estimation, is 60 to 65%. The  left ventricle has normal function. The left ventricle has no regional  wall motion abnormalities. Left ventricular diastolic parameters are  consistent with Grade I diastolic  dysfunction (impaired relaxation).   2. Right ventricular systolic function is normal. The right ventricular  size is normal. Tricuspid regurgitation signal is inadequate for assessing  PA pressure.   3. The mitral valve is normal in structure. No evidence of mitral valve  regurgitation. No evidence of mitral stenosis.   4. The aortic valve was not well visualized. Aortic valve regurgitation  is not visualized. Mild aortic valve stenosis. Aortic valve area, by VTI  measures 1.89 cm. Aortic valve mean gradient measures 17.5 mmHg. Aortic  valve Vmax measures 2.56 m/s.   5. The inferior vena cava is normal in size with greater than 50%  respiratory variability, suggesting right atrial pressure of 3 mmHg.  PCI 04/27/2020: 1.  Successful orbital atherectomy and stenting of severe  calcific stenosis in the proximal LAD, procedure guided by OCT imaging, treated with a 4.0 x 12 mm resolute Onyx DES 2.  Moderate left circumflex stenosis with negative pressure wire analysis.   Plan: Overnight observation, likely discharge home tomorrow as long as no complications arise.  Continue antiplatelet therapy with aspirin and clopidogrel minimum 6 months, plan to proceed with TAVR in staged fashion.  Cath 03/31/2020: 1. Low filling pressures.  2. Preserved cardiac output.  3. Known occluded RCA with robust collaterals from LAD.  4. 95% calcified proximal LAD stenosis (LAD provides RCA collaterals).  5. 60-70% proximal LCx stenosis.    Patient has known severe AS (from echo).  I reviewed films with Drs Angelena Form and Burt Knack.  PCI-TAVR would be an option, would likely need atherectomy to address LAD and then PCI LCx.  SAVR-CABG would also be an option, and may be safer in the long term as any compromise to an LAD stent would affect both LAD and RCA territory. Patient will be seen by cardiac surgeon and get scans for TAVR planning.     Wynonia Musty [facility]/Anesthesiology Phone [phone] 03/01/2021 1:19 PM

## 2021-03-03 ENCOUNTER — Inpatient Hospital Stay (HOSPITAL_COMMUNITY)
Admission: RE | Admit: 2021-03-03 | Discharge: 2021-03-04 | DRG: 269 | Disposition: A | Discharge status: Home / Self Care | Payer: Medicare Other | Attending: Vascular Surgery | Admitting: Vascular Surgery

## 2021-03-03 ENCOUNTER — Encounter (HOSPITAL_COMMUNITY)
Admission: RE | Disposition: A | Discharge status: Home / Self Care | Payer: Self-pay | Source: Home / Self Care | Attending: Vascular Surgery

## 2021-03-03 ENCOUNTER — Other Ambulatory Visit: Payer: Self-pay

## 2021-03-03 ENCOUNTER — Inpatient Hospital Stay (HOSPITAL_COMMUNITY): Payer: Medicare Other | Admitting: Physician Assistant

## 2021-03-03 ENCOUNTER — Inpatient Hospital Stay (HOSPITAL_COMMUNITY): Payer: Medicare Other

## 2021-03-03 ENCOUNTER — Inpatient Hospital Stay (HOSPITAL_COMMUNITY): Payer: Medicare Other | Admitting: Anesthesiology

## 2021-03-03 ENCOUNTER — Encounter (HOSPITAL_COMMUNITY): Payer: Self-pay | Admitting: Vascular Surgery

## 2021-03-03 DIAGNOSIS — Z923 Personal history of irradiation: Secondary | ICD-10-CM

## 2021-03-03 DIAGNOSIS — Z85818 Personal history of malignant neoplasm of other sites of lip, oral cavity, and pharynx: Secondary | ICD-10-CM | POA: Diagnosis not present

## 2021-03-03 DIAGNOSIS — I2510 Atherosclerotic heart disease of native coronary artery without angina pectoris: Secondary | ICD-10-CM | POA: Diagnosis present

## 2021-03-03 DIAGNOSIS — G894 Chronic pain syndrome: Secondary | ICD-10-CM | POA: Diagnosis present

## 2021-03-03 DIAGNOSIS — Z87891 Personal history of nicotine dependence: Secondary | ICD-10-CM

## 2021-03-03 DIAGNOSIS — Z981 Arthrodesis status: Secondary | ICD-10-CM

## 2021-03-03 DIAGNOSIS — Z79899 Other long term (current) drug therapy: Secondary | ICD-10-CM | POA: Diagnosis not present

## 2021-03-03 DIAGNOSIS — K219 Gastro-esophageal reflux disease without esophagitis: Secondary | ICD-10-CM | POA: Diagnosis present

## 2021-03-03 DIAGNOSIS — M069 Rheumatoid arthritis, unspecified: Secondary | ICD-10-CM | POA: Diagnosis present

## 2021-03-03 DIAGNOSIS — I714 Abdominal aortic aneurysm, without rupture, unspecified: Secondary | ICD-10-CM | POA: Diagnosis present

## 2021-03-03 DIAGNOSIS — Z955 Presence of coronary angioplasty implant and graft: Secondary | ICD-10-CM

## 2021-03-03 DIAGNOSIS — Z8673 Personal history of transient ischemic attack (TIA), and cerebral infarction without residual deficits: Secondary | ICD-10-CM

## 2021-03-03 DIAGNOSIS — Y832 Surgical operation with anastomosis, bypass or graft as the cause of abnormal reaction of the patient, or of later complication, without mention of misadventure at the time of the procedure: Secondary | ICD-10-CM | POA: Diagnosis not present

## 2021-03-03 DIAGNOSIS — M353 Polymyalgia rheumatica: Secondary | ICD-10-CM | POA: Diagnosis present

## 2021-03-03 DIAGNOSIS — Z953 Presence of xenogenic heart valve: Secondary | ICD-10-CM

## 2021-03-03 DIAGNOSIS — E785 Hyperlipidemia, unspecified: Secondary | ICD-10-CM | POA: Diagnosis present

## 2021-03-03 DIAGNOSIS — Z8679 Personal history of other diseases of the circulatory system: Secondary | ICD-10-CM

## 2021-03-03 DIAGNOSIS — Z95828 Presence of other vascular implants and grafts: Secondary | ICD-10-CM

## 2021-03-03 DIAGNOSIS — I110 Hypertensive heart disease with heart failure: Secondary | ICD-10-CM | POA: Diagnosis present

## 2021-03-03 DIAGNOSIS — J449 Chronic obstructive pulmonary disease, unspecified: Secondary | ICD-10-CM | POA: Diagnosis present

## 2021-03-03 DIAGNOSIS — Z8249 Family history of ischemic heart disease and other diseases of the circulatory system: Secondary | ICD-10-CM | POA: Diagnosis not present

## 2021-03-03 DIAGNOSIS — Z7952 Long term (current) use of systemic steroids: Secondary | ICD-10-CM

## 2021-03-03 DIAGNOSIS — I5032 Chronic diastolic (congestive) heart failure: Secondary | ICD-10-CM | POA: Diagnosis present

## 2021-03-03 DIAGNOSIS — Z7982 Long term (current) use of aspirin: Secondary | ICD-10-CM

## 2021-03-03 DIAGNOSIS — I9789 Other postprocedural complications and disorders of the circulatory system, not elsewhere classified: Secondary | ICD-10-CM | POA: Diagnosis not present

## 2021-03-03 DIAGNOSIS — Z7902 Long term (current) use of antithrombotics/antiplatelets: Secondary | ICD-10-CM

## 2021-03-03 DIAGNOSIS — F32A Depression, unspecified: Secondary | ICD-10-CM | POA: Diagnosis present

## 2021-03-03 DIAGNOSIS — Z886 Allergy status to analgesic agent status: Secondary | ICD-10-CM

## 2021-03-03 DIAGNOSIS — Z91048 Other nonmedicinal substance allergy status: Secondary | ICD-10-CM | POA: Diagnosis not present

## 2021-03-03 DIAGNOSIS — N400 Enlarged prostate without lower urinary tract symptoms: Secondary | ICD-10-CM | POA: Diagnosis present

## 2021-03-03 DIAGNOSIS — I7143 Infrarenal abdominal aortic aneurysm, without rupture: Secondary | ICD-10-CM

## 2021-03-03 HISTORY — PX: ULTRASOUND GUIDANCE FOR VASCULAR ACCESS: SHX6516

## 2021-03-03 HISTORY — PX: ABDOMINAL AORTIC ENDOVASCULAR STENT GRAFT: SHX5707

## 2021-03-03 LAB — BASIC METABOLIC PANEL
Anion gap: 6 (ref 5–15)
BUN: 13 mg/dL (ref 8–23)
CO2: 29 mmol/L (ref 22–32)
Calcium: 8.1 mg/dL — ABNORMAL LOW (ref 8.9–10.3)
Chloride: 100 mmol/L (ref 98–111)
Creatinine, Ser: 0.74 mg/dL (ref 0.61–1.24)
GFR, Estimated: >60 mL/min (ref >60)
Glucose, Bld: 159 mg/dL — ABNORMAL HIGH (ref 70–99)
Potassium: 3.8 mmol/L (ref 3.5–5.1)
Sodium: 135 mmol/L (ref 135–145)

## 2021-03-03 LAB — PROTIME-INR
INR: 1.1 (ref 0.8–1.2)
Prothrombin Time: 13.9 seconds (ref 11.4–15.2)

## 2021-03-03 LAB — CBC
HCT: 31.7 % — ABNORMAL LOW (ref 39.0–52.0)
Hemoglobin: 9 g/dL — ABNORMAL LOW (ref 13.0–17.0)
MCH: 23.1 pg — ABNORMAL LOW (ref 26.0–34.0)
MCHC: 28.4 g/dL — ABNORMAL LOW (ref 30.0–36.0)
MCV: 81.3 fL (ref 80.0–100.0)
Platelets: 146 10*3/uL — ABNORMAL LOW (ref 150–400)
RBC: 3.9 MIL/uL — ABNORMAL LOW (ref 4.22–5.81)
RDW: 14.9 % (ref 11.5–15.5)
WBC: 8.2 10*3/uL (ref 4.0–10.5)
nRBC: 0 % (ref 0.0–0.2)

## 2021-03-03 LAB — PREPARE RBC (CROSSMATCH)

## 2021-03-03 LAB — APTT: aPTT: 32 seconds (ref 24–36)

## 2021-03-03 LAB — MAGNESIUM: Magnesium: 1.8 mg/dL (ref 1.7–2.4)

## 2021-03-03 SURGERY — INSERTION, ENDOVASCULAR STENT GRAFT, AORTA, ABDOMINAL
Anesthesia: General | Site: Groin

## 2021-03-03 MED ORDER — SODIUM CHLORIDE 0.9 % IV SOLN: INTRAVENOUS | Status: DC

## 2021-03-03 MED ORDER — SODIUM CHLORIDE 0.9 % IV SOLN: 500.0000 mL | Freq: Once | INTRAVENOUS | Status: DC | PRN

## 2021-03-03 MED ORDER — HEPARIN SODIUM (PORCINE) 1000 UNIT/ML IJ SOLN
INTRAMUSCULAR | Status: DC | PRN
  Administered 2021-03-03: 3000 [IU] via INTRAVENOUS
  Administered 2021-03-03: 8000 [IU] via INTRAVENOUS

## 2021-03-03 MED ORDER — DOCUSATE SODIUM 100 MG PO CAPS
100.0000 mg | ORAL_CAPSULE | Freq: Every day | ORAL | Status: DC
  Administered 2021-03-04: 100 mg via ORAL
  Filled 2021-03-03: qty 1

## 2021-03-03 MED ORDER — PHENOL 1.4 % MT LIQD: 1.0000 | OROMUCOSAL | Status: DC | PRN

## 2021-03-03 MED ORDER — TRAMADOL HCL 50 MG PO TABS
100.0000 mg | ORAL_TABLET | Freq: Two times a day (BID) | ORAL | Status: DC
  Administered 2021-03-03 – 2021-03-04 (×2): 100 mg via ORAL
  Filled 2021-03-03 (×2): qty 2

## 2021-03-03 MED ORDER — LIDOCAINE 2% (20 MG/ML) 5 ML SYRINGE
INTRAMUSCULAR | Status: DC | PRN
  Administered 2021-03-03: 60 mg via INTRAVENOUS

## 2021-03-03 MED ORDER — CLOPIDOGREL BISULFATE 75 MG PO TABS
75.0000 mg | ORAL_TABLET | Freq: Every day | ORAL | Status: DC
  Administered 2021-03-04: 75 mg via ORAL
  Filled 2021-03-03: qty 1

## 2021-03-03 MED ORDER — DEXAMETHASONE SODIUM PHOSPHATE 10 MG/ML IJ SOLN
INTRAMUSCULAR | Status: DC | PRN
  Administered 2021-03-03: 5 mg via INTRAVENOUS

## 2021-03-03 MED ORDER — ONDANSETRON HCL 4 MG/2ML IJ SOLN
INTRAMUSCULAR | Status: DC | PRN
  Administered 2021-03-03: 4 mg via INTRAVENOUS

## 2021-03-03 MED ORDER — POTASSIUM CHLORIDE CRYS ER 20 MEQ PO TBCR: 20.0000 meq | EXTENDED_RELEASE_TABLET | Freq: Every day | ORAL | Status: DC | PRN

## 2021-03-03 MED ORDER — PREGABALIN 25 MG PO CAPS
50.0000 mg | ORAL_CAPSULE | Freq: Two times a day (BID) | ORAL | Status: DC
  Administered 2021-03-03 – 2021-03-04 (×2): 50 mg via ORAL
  Filled 2021-03-03 (×2): qty 2

## 2021-03-03 MED ORDER — TIOTROPIUM BROMIDE MONOHYDRATE 2.5 MCG/ACT IN AERS: 2.0000 | INHALATION_SPRAY | Freq: Every day | RESPIRATORY_TRACT | Status: DC | PRN

## 2021-03-03 MED ORDER — TAMSULOSIN HCL 0.4 MG PO CAPS
0.4000 mg | ORAL_CAPSULE | Freq: Two times a day (BID) | ORAL | Status: DC
Start: 2021-03-03 — End: 2021-03-04
  Administered 2021-03-03 – 2021-03-04 (×2): 0.4 mg via ORAL
  Filled 2021-03-03 (×2): qty 1

## 2021-03-03 MED ORDER — LACTATED RINGERS IV SOLN
INTRAVENOUS | Status: DC | PRN
Start: 2021-03-03 — End: 2021-03-03

## 2021-03-03 MED ORDER — OXYCODONE HCL 5 MG PO TABS
10.0000 mg | ORAL_TABLET | Freq: Four times a day (QID) | ORAL | Status: DC | PRN
  Administered 2021-03-03 – 2021-03-04 (×3): 10 mg via ORAL
  Filled 2021-03-03 (×3): qty 2

## 2021-03-03 MED ORDER — CHLORHEXIDINE GLUCONATE CLOTH 2 % EX PADS: 6.0000 | MEDICATED_PAD | Freq: Once | CUTANEOUS | Status: DC

## 2021-03-03 MED ORDER — ALBUTEROL SULFATE (2.5 MG/3ML) 0.083% IN NEBU: 3.0000 mL | INHALATION_SOLUTION | Freq: Four times a day (QID) | RESPIRATORY_TRACT | Status: DC | PRN

## 2021-03-03 MED ORDER — ATORVASTATIN CALCIUM 80 MG PO TABS
80.0000 mg | ORAL_TABLET | Freq: Every day | ORAL | Status: DC
  Administered 2021-03-04: 80 mg via ORAL
  Filled 2021-03-03: qty 1

## 2021-03-03 MED ORDER — PROPOFOL 10 MG/ML IV BOLUS
INTRAVENOUS | Status: AC
  Filled 2021-03-03: qty 20

## 2021-03-03 MED ORDER — PHENYLEPHRINE 40 MCG/ML (10ML) SYRINGE FOR IV PUSH (FOR BLOOD PRESSURE SUPPORT)
PREFILLED_SYRINGE | INTRAVENOUS | Status: DC | PRN
  Administered 2021-03-03: 40 ug via INTRAVENOUS

## 2021-03-03 MED ORDER — CEFAZOLIN SODIUM-DEXTROSE 2-4 GM/100ML-% IV SOLN
2.0000 g | INTRAVENOUS | Status: AC
  Administered 2021-03-03: 2 g via INTRAVENOUS
  Filled 2021-03-03: qty 100

## 2021-03-03 MED ORDER — SENNA 8.6 MG PO TABS
2.0000 | ORAL_TABLET | Freq: Every day | ORAL | Status: DC | PRN
  Administered 2021-03-03: 17.2 mg via ORAL

## 2021-03-03 MED ORDER — GUAIFENESIN-DM 100-10 MG/5ML PO SYRP: 15.0000 mL | ORAL_SOLUTION | ORAL | Status: DC | PRN

## 2021-03-03 MED ORDER — PANTOPRAZOLE SODIUM 40 MG PO TBEC
40.0000 mg | DELAYED_RELEASE_TABLET | Freq: Every day | ORAL | Status: DC
  Administered 2021-03-04: 40 mg via ORAL
  Filled 2021-03-03: qty 1

## 2021-03-03 MED ORDER — LIDOCAINE 2% (20 MG/ML) 5 ML SYRINGE
INTRAMUSCULAR | Status: AC
  Filled 2021-03-03: qty 5

## 2021-03-03 MED ORDER — ASPIRIN EC 81 MG PO TBEC
81.0000 mg | DELAYED_RELEASE_TABLET | Freq: Every day | ORAL | Status: DC
  Administered 2021-03-04: 81 mg via ORAL
  Filled 2021-03-03: qty 1

## 2021-03-03 MED ORDER — CEFAZOLIN SODIUM-DEXTROSE 2-4 GM/100ML-% IV SOLN
2.0000 g | Freq: Three times a day (TID) | INTRAVENOUS | Status: AC
  Administered 2021-03-03 (×2): 2 g via INTRAVENOUS
  Filled 2021-03-03 (×2): qty 100

## 2021-03-03 MED ORDER — HEPARIN SODIUM (PORCINE) 1000 UNIT/ML IJ SOLN
INTRAMUSCULAR | Status: AC
  Filled 2021-03-03: qty 10

## 2021-03-03 MED ORDER — PROTAMINE SULFATE 10 MG/ML IV SOLN
INTRAVENOUS | Status: DC | PRN
  Administered 2021-03-03: 50 mg via INTRAVENOUS

## 2021-03-03 MED ORDER — ACETAMINOPHEN 500 MG PO TABS
1000.0000 mg | ORAL_TABLET | Freq: Once | ORAL | Status: AC
  Administered 2021-03-03: 1000 mg via ORAL
  Filled 2021-03-03: qty 2

## 2021-03-03 MED ORDER — HEPARIN 6000 UNIT IRRIGATION SOLUTION
Status: DC | PRN
  Administered 2021-03-03: 1

## 2021-03-03 MED ORDER — FENTANYL CITRATE (PF) 100 MCG/2ML IJ SOLN
INTRAMUSCULAR | Status: AC
  Filled 2021-03-03: qty 2

## 2021-03-03 MED ORDER — CHLORHEXIDINE GLUCONATE 0.12 % MT SOLN
15.0000 mL | Freq: Once | OROMUCOSAL | Status: AC
  Filled 2021-03-03: qty 15

## 2021-03-03 MED ORDER — VITAMIN D (ERGOCALCIFEROL) 1.25 MG (50000 UNIT) PO CAPS: 50000.0000 [IU] | ORAL_CAPSULE | ORAL | Status: DC

## 2021-03-03 MED ORDER — HEPARIN SODIUM (PORCINE) 5000 UNIT/ML IJ SOLN
5000.0000 [IU] | Freq: Three times a day (TID) | INTRAMUSCULAR | Status: DC
  Administered 2021-03-04: 5000 [IU] via SUBCUTANEOUS
  Filled 2021-03-03: qty 1

## 2021-03-03 MED ORDER — FLUTICASONE PROPIONATE 50 MCG/ACT NA SUSP: 2.0000 | Freq: Every day | NASAL | Status: DC

## 2021-03-03 MED ORDER — IODIXANOL 320 MG/ML IV SOLN
INTRAVENOUS | Status: DC | PRN
  Administered 2021-03-03: 70 mL via INTRA_ARTERIAL

## 2021-03-03 MED ORDER — CHLORHEXIDINE GLUCONATE 0.12 % MT SOLN
OROMUCOSAL | Status: AC
  Administered 2021-03-03: 15 mL via OROMUCOSAL
  Filled 2021-03-03: qty 15

## 2021-03-03 MED ORDER — ACETAMINOPHEN 650 MG RE SUPP: 325.0000 mg | RECTAL | Status: DC | PRN

## 2021-03-03 MED ORDER — HYDRALAZINE HCL 20 MG/ML IJ SOLN: 5.0000 mg | INTRAMUSCULAR | Status: DC | PRN

## 2021-03-03 MED ORDER — METOPROLOL TARTRATE 5 MG/5ML IV SOLN: 2.0000 mg | INTRAVENOUS | Status: DC | PRN

## 2021-03-03 MED ORDER — CITALOPRAM HYDROBROMIDE 20 MG PO TABS
20.0000 mg | ORAL_TABLET | Freq: Every day | ORAL | Status: DC
  Administered 2021-03-04: 20 mg via ORAL
  Filled 2021-03-03: qty 1

## 2021-03-03 MED ORDER — MORPHINE SULFATE (PF) 2 MG/ML IV SOLN
2.0000 mg | INTRAVENOUS | Status: DC | PRN
  Administered 2021-03-03: 2 mg via INTRAVENOUS
  Filled 2021-03-03: qty 1

## 2021-03-03 MED ORDER — ONDANSETRON HCL 4 MG/2ML IJ SOLN: 4.0000 mg | Freq: Four times a day (QID) | INTRAMUSCULAR | Status: DC | PRN

## 2021-03-03 MED ORDER — MAGNESIUM SULFATE 2 GM/50ML IV SOLN: 2.0000 g | Freq: Every day | INTRAVENOUS | Status: DC | PRN

## 2021-03-03 MED ORDER — FENTANYL CITRATE (PF) 250 MCG/5ML IJ SOLN
INTRAMUSCULAR | Status: AC
  Filled 2021-03-03: qty 5

## 2021-03-03 MED ORDER — CARVEDILOL 12.5 MG PO TABS
12.5000 mg | ORAL_TABLET | Freq: Two times a day (BID) | ORAL | Status: DC
  Administered 2021-03-03 – 2021-03-04 (×2): 12.5 mg via ORAL
  Filled 2021-03-03 (×2): qty 1

## 2021-03-03 MED ORDER — GABAPENTIN 300 MG PO CAPS
600.0000 mg | ORAL_CAPSULE | Freq: Two times a day (BID) | ORAL | Status: DC
  Administered 2021-03-03 – 2021-03-04 (×2): 600 mg via ORAL
  Filled 2021-03-03 (×2): qty 2

## 2021-03-03 MED ORDER — ACETAMINOPHEN 325 MG PO TABS: 325.0000 mg | ORAL_TABLET | ORAL | Status: DC | PRN

## 2021-03-03 MED ORDER — ONDANSETRON HCL 4 MG/2ML IJ SOLN
INTRAMUSCULAR | Status: AC
  Filled 2021-03-03: qty 2

## 2021-03-03 MED ORDER — SODIUM CHLORIDE 0.9% IV SOLUTION: Freq: Once | INTRAVENOUS | Status: DC

## 2021-03-03 MED ORDER — PROTAMINE SULFATE 10 MG/ML IV SOLN
INTRAVENOUS | Status: AC
  Filled 2021-03-03: qty 5

## 2021-03-03 MED ORDER — IPRATROPIUM BROMIDE 0.06 % NA SOLN
2.0000 | Freq: Two times a day (BID) | NASAL | Status: DC
  Administered 2021-03-04: 2 via NASAL
  Filled 2021-03-03: qty 15

## 2021-03-03 MED ORDER — PREDNISONE 5 MG PO TABS
5.0000 mg | ORAL_TABLET | Freq: Every day | ORAL | Status: DC
  Administered 2021-03-04: 5 mg via ORAL
  Filled 2021-03-03: qty 1

## 2021-03-03 MED ORDER — LABETALOL HCL 5 MG/ML IV SOLN: 10.0000 mg | INTRAVENOUS | Status: DC | PRN

## 2021-03-03 MED ORDER — DEXAMETHASONE SODIUM PHOSPHATE 10 MG/ML IJ SOLN
INTRAMUSCULAR | Status: AC
  Filled 2021-03-03: qty 1

## 2021-03-03 MED ORDER — PHENYLEPHRINE 40 MCG/ML (10ML) SYRINGE FOR IV PUSH (FOR BLOOD PRESSURE SUPPORT)
PREFILLED_SYRINGE | INTRAVENOUS | Status: AC
  Filled 2021-03-03: qty 10

## 2021-03-03 MED ORDER — PHENYLEPHRINE HCL-NACL 20-0.9 MG/250ML-% IV SOLN
INTRAVENOUS | Status: DC | PRN
  Administered 2021-03-03: 20 ug/min via INTRAVENOUS

## 2021-03-03 MED ORDER — ROCURONIUM BROMIDE 10 MG/ML (PF) SYRINGE
PREFILLED_SYRINGE | INTRAVENOUS | Status: AC
  Filled 2021-03-03: qty 10

## 2021-03-03 MED ORDER — FENTANYL CITRATE (PF) 100 MCG/2ML IJ SOLN
25.0000 ug | INTRAMUSCULAR | Status: AC | PRN
  Administered 2021-03-03 (×6): 25 ug via INTRAVENOUS

## 2021-03-03 MED ORDER — EZETIMIBE 10 MG PO TABS
10.0000 mg | ORAL_TABLET | Freq: Every day | ORAL | Status: DC
  Administered 2021-03-04: 10 mg via ORAL
  Filled 2021-03-03: qty 1

## 2021-03-03 MED ORDER — ROCURONIUM BROMIDE 10 MG/ML (PF) SYRINGE
PREFILLED_SYRINGE | INTRAVENOUS | Status: DC | PRN
  Administered 2021-03-03: 60 mg via INTRAVENOUS

## 2021-03-03 MED ORDER — FUROSEMIDE 20 MG PO TABS
20.0000 mg | ORAL_TABLET | Freq: Every day | ORAL | Status: DC
  Administered 2021-03-04: 20 mg via ORAL
  Filled 2021-03-03: qty 1

## 2021-03-03 MED ORDER — 0.9 % SODIUM CHLORIDE (POUR BTL) OPTIME
TOPICAL | Status: DC | PRN
  Administered 2021-03-03: 1000 mL

## 2021-03-03 MED ORDER — PROPOFOL 10 MG/ML IV BOLUS
INTRAVENOUS | Status: DC | PRN
  Administered 2021-03-03: 140 mg via INTRAVENOUS

## 2021-03-03 MED ORDER — ALUM & MAG HYDROXIDE-SIMETH 200-200-20 MG/5ML PO SUSP: 15.0000 mL | ORAL | Status: DC | PRN

## 2021-03-03 MED ORDER — SUGAMMADEX SODIUM 200 MG/2ML IV SOLN
INTRAVENOUS | Status: DC | PRN
  Administered 2021-03-03: 200 mg via INTRAVENOUS

## 2021-03-03 MED ORDER — LACTATED RINGERS IV SOLN: INTRAVENOUS | Status: DC | PRN

## 2021-03-03 MED ORDER — FENTANYL CITRATE (PF) 250 MCG/5ML IJ SOLN
INTRAMUSCULAR | Status: DC | PRN
Start: 2021-03-03 — End: 2021-03-03
  Administered 2021-03-03: 100 ug via INTRAVENOUS

## 2021-03-03 SURGICAL SUPPLY — 63 items
ADH SKN CLS APL DERMABOND .7 (GAUZE/BANDAGES/DRESSINGS) ×4
BAG COUNTER SPONGE SURGICOUNT (BAG) ×3 IMPLANT
BAG SPNG CNTER NS LX DISP (BAG) ×2
BLADE CLIPPER SURG (BLADE) ×3 IMPLANT
CANISTER SUCT 3000ML PPV (MISCELLANEOUS) ×3 IMPLANT
CATH BEACON 5.038 65CM KMP-01 (CATHETERS) ×3 IMPLANT
CATH OMNI FLUSH .035X70CM (CATHETERS) ×3 IMPLANT
CLIP LIGATING EXTRA MED SLVR (CLIP) ×3 IMPLANT
CLIP LIGATING EXTRA SM BLUE (MISCELLANEOUS) ×3 IMPLANT
CLOSURE PERCLOSE PROSTYLE (VASCULAR PRODUCTS) ×4 IMPLANT
DERMABOND ADVANCED (GAUZE/BANDAGES/DRESSINGS) ×2
DERMABOND ADVANCED .7 DNX12 (GAUZE/BANDAGES/DRESSINGS) ×2 IMPLANT
DEVICE CLOSURE PERCLS PRGLD 6F (VASCULAR PRODUCTS) IMPLANT
DEVICE TORQUE KENDALL .025-038 (MISCELLANEOUS) IMPLANT
DRSG TEGADERM 2-3/8X2-3/4 SM (GAUZE/BANDAGES/DRESSINGS) ×6 IMPLANT
DRYSEAL FLEXSHEATH 12FR 33CM (SHEATH) ×1
DRYSEAL FLEXSHEATH 16FR 33CM (SHEATH) ×1
ELECT REM PT RETURN 9FT ADLT (ELECTROSURGICAL) ×6
ELECTRODE REM PT RTRN 9FT ADLT (ELECTROSURGICAL) ×4 IMPLANT
EXCLDR TRNK ENDO 26X14.5X12 16 (Endovascular Graft) ×3 IMPLANT
EXCLUDER TNK END 26X14.5X12 16 (Endovascular Graft) IMPLANT
GAUZE SPONGE 2X2 8PLY STRL LF (GAUZE/BANDAGES/DRESSINGS) ×4 IMPLANT
GLIDEWIRE ADV .035X180CM (WIRE) ×1 IMPLANT
GLOVE SURG ENC MOIS LTX SZ7.5 (GLOVE) ×3 IMPLANT
GOWN STRL REUS W/ TWL LRG LVL3 (GOWN DISPOSABLE) ×4 IMPLANT
GOWN STRL REUS W/ TWL XL LVL3 (GOWN DISPOSABLE) ×4 IMPLANT
GOWN STRL REUS W/TWL LRG LVL3 (GOWN DISPOSABLE) ×6
GOWN STRL REUS W/TWL XL LVL3 (GOWN DISPOSABLE) ×6
GRAFT BALLN CATH 65CM (STENTS) ×2 IMPLANT
GUIDEWIRE ANGLED .035X150CM (WIRE) IMPLANT
KIT BASIN OR (CUSTOM PROCEDURE TRAY) ×3 IMPLANT
KIT DRAIN CSF ACCUDRAIN (MISCELLANEOUS) IMPLANT
KIT TURNOVER KIT B (KITS) ×3 IMPLANT
LEG CONTRALATERAL 16X12X10 (Vascular Products) ×3 IMPLANT
LEG CONTRALATERAL 16X12X12 (Vascular Products) ×1 IMPLANT
NDL PERC 18GX7CM (NEEDLE) IMPLANT
NEEDLE PERC 18GX7CM (NEEDLE) IMPLANT
NS IRRIG 1000ML POUR BTL (IV SOLUTION) ×3 IMPLANT
PACK ENDOVASCULAR (PACKS) ×3 IMPLANT
PAD ARMBOARD 7.5X6 YLW CONV (MISCELLANEOUS) ×6 IMPLANT
PERCLOSE PROGLIDE 6F (VASCULAR PRODUCTS)
SET MICROPUNCTURE 5F STIFF (MISCELLANEOUS) ×3 IMPLANT
SHEATH BRITE TIP 8FR 23CM (SHEATH) ×3 IMPLANT
SHEATH DRYSEAL FLEX 12FR 33CM (SHEATH) IMPLANT
SHEATH DRYSEAL FLEX 16FR 33CM (SHEATH) IMPLANT
SHEATH PINNACLE 8F 10CM (SHEATH) ×3 IMPLANT
SPONGE GAUZE 2X2 STER 10/PKG (GAUZE/BANDAGES/DRESSINGS) ×2
STENT GRAFT BALLN CATH 65CM (STENTS) ×3
STENT GRAFT CONTRALAT 16X12X10 (Vascular Products) IMPLANT
STOPCOCK MORSE 400PSI 3WAY (MISCELLANEOUS) ×3 IMPLANT
SUT MNCRL AB 4-0 PS2 18 (SUTURE) ×6 IMPLANT
SUT PROLENE 5 0 C 1 24 (SUTURE) IMPLANT
SUT VIC AB 2-0 CT1 27 (SUTURE)
SUT VIC AB 2-0 CT1 TAPERPNT 27 (SUTURE) IMPLANT
SUT VIC AB 3-0 SH 27 (SUTURE)
SUT VIC AB 3-0 SH 27X BRD (SUTURE) IMPLANT
SYR 20ML LL LF (SYRINGE) ×3 IMPLANT
TOWEL GREEN STERILE (TOWEL DISPOSABLE) ×3 IMPLANT
TRAY FOLEY MTR SLVR 16FR STAT (SET/KITS/TRAYS/PACK) ×3 IMPLANT
TUBING INJECTOR 48 (MISCELLANEOUS) ×3 IMPLANT
WIRE AMPLATZ SS-J .035X180CM (WIRE) ×6 IMPLANT
WIRE BENTSON .035X145CM (WIRE) ×6 IMPLANT
WIRE TORQFLEX AUST .018X40CM (WIRE) ×1 IMPLANT

## 2021-03-03 NOTE — Progress Notes (Signed)
Patient brought to 4E from PACU. VSS. Telemetry box applied, CCMD notified. Patient oriented to room and staff. Call bell in reach.  Groin sites bilateral clean, dry, intact.  Daymon Larsen, RN

## 2021-03-03 NOTE — H&P (Signed)
HPI:   Brandon [name] is a 81 y.o. male history of known abdominal aortic aneurysm.  He recently underwent TAVR.  He is followed by Dr. Aundra Dubin with cardiology.  He is on steroids for rheumatoid arthritis with recent weight gain of approximately 20 pounds.  He does not have any new back or abdominal pain. Recently underwent left hip surgery for fracture.        Past Medical History:  Diagnosis Date   Abdominal aneurysm     Arthritis      "all over" (07/19/2016)   BPH (benign prostatic hypertrophy)     CAD (coronary artery disease)     Chicken pox     Chronic lower back pain      s/p surgical fusion   Depression     Diverticulosis     Esophageal stricture     GERD (gastroesophageal reflux disease)     Hepatitis B 1984   Hiatal hernia     History of radiation therapy 01/16/18- 03/05/18    Left Tonsil, 66 Gy in 33 fractions to high risk nodal echelons.    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 Burt Knack and Dr Cyndia Bent    Severe aortic stenosis     TIA (transient ischemic attack) 1990s    hx   tonsillar ca dx'd 11/2017   Tubular adenoma of colon 2009         Family History  Problem Relation Age of Onset   Heart disease Father 58        Living   Coronary artery disease Father          CABG   Alzheimer's disease Mother 26        Deceased   Arthritis Mother     Aneurysm Brother     Stomach cancer Maternal Uncle     Brain cancer Maternal Aunt          x2   Obesity Daughter          Had Bypass Sx         Past Surgical History:  Procedure Laterality Date   BACK SURGERY       CARDIAC CATHETERIZATION       CATARACT EXTRACTION W/ INTRAOCULAR LENS  IMPLANT, BILATERAL Bilateral 01/2012 - 02/2012   CORONARY ATHERECTOMY N/A 04/27/2020    Procedure: CORONARY ATHERECTOMY;  Surgeon:  Sherren Mocha, MD;  Location: [place] [facility];  Service: Cardiovascular;  Laterality: N/A;   CORONARY STENT INTERVENTION N/A 04/27/2020    Procedure: CORONARY STENT INTERVENTION;  Surgeon: Sherren Mocha, MD;  Location: [place] [facility];  Service: Cardiovascular;  Laterality: N/A;   DIRECT LARYNGOSCOPY Left 10/09/2017    Procedure: DIRECT LARYNGOSCOPY WITH BOPSY;  Surgeon: Jodi Marble, MD;  Location: [place];  Service: ENT;  Laterality: Left;   ESOPHAGOGASTRODUODENOSCOPY (EGD) WITH ESOPHAGEAL DILATION        "couple times" (07/19/2016)   ESOPHAGOSCOPY Left 10/09/2017    Procedure: ESOPHAGOSCOPY;  Surgeon: Jodi Marble, MD;  Location: [place];  Service: ENT;  Laterality: Left;   INTRAVASCULAR IMAGING/OCT N/A 04/27/2020    Procedure: INTRAVASCULAR IMAGING/OCT;  Surgeon: Sherren Mocha, MD;  Location: [place] [facility];  Service: Cardiovascular;  Laterality: N/A;   INTRAVASCULAR PRESSURE WIRE/FFR STUDY N/A 04/27/2020    Procedure: INTRAVASCULAR PRESSURE WIRE/FFR STUDY;  Surgeon: Sherren Mocha, MD;  Location: [place] [facility];  Service: Cardiovascular;  Laterality: N/A;   IR GASTROSTOMY TUBE MOD SED   01/08/2018   IR THORACENTESIS ASP PLEURAL SPACE W/IMG GUIDE   07/19/2016   LAPAROSCOPIC CHOLECYSTECTOMY   1994   LUMBAR [place] SURGERY   05/1996    L4-5; Dr. Claudean Kinds LAMINECTOMY/DECOMPRESSION MICRODISCECTOMY   10/2002    L3-4. Dr. Sherwood Gambler   MULTIPLE TOOTH EXTRACTIONS   1980s   PARTIAL GLOSSECTOMY   12/02/2017    Dr. Nicolette [facility]- [facility]   pharyngoplasty for closure of tingue base defect   12/02/2017    Dr. Nicolette Bang- [place]   07/15/2016   POSTERIOR LUMBAR FUSION   09/1996    Ray cage, L4-5 Dr. Rita Ohara   PROSTATE BIOPSY   ~ 2017   radical tonsillectomy Left 12/02/2017    Dr. Nicolette Bang at [place] N/A 03/31/2020    Procedure: RIGHT HEART CATH AND CORONARY ANGIOGRAPHY;  Surgeon: Larey Dresser, MD;  Location: [place] [facility];  Service: Cardiovascular;  Laterality: N/A;   RIGID BRONCHOSCOPY Left 10/09/2017    Procedure: RIGID BRONCHOSCOPY;  Surgeon: Jodi Marble, MD;  Location: [place];  Service: ENT;  Laterality: Left;   TEE WITHOUT CARDIOVERSION N/A 06/07/2020    Procedure: TRANSESOPHAGEAL ECHOCARDIOGRAM (TEE);  Surgeon: Sherren Mocha, MD;  Location: [place];  Service: Open Heart Surgery;  Laterality: N/A;   TONSILLECTOMY       TRACHEOSTOMY   12/02/2017    Dr. Nicolette Bang- [facility]   ULTRASOUND GUIDANCE FOR VASCULAR ACCESS Right 06/07/2020    Procedure: ULTRASOUND GUIDANCE FOR VASCULAR ACCESS;  Surgeon: Sherren Mocha, MD;  Location: [place];  Service: Open Heart Surgery;  Laterality: Right;   VASCULAR SURGERY          Short Social History:  Social History         Tobacco Use   Smoking status: Former      Packs/day: 1.50      Years: 50.00      Pack years: 75.00      Types: Cigarettes      Quit date: 09/05/2017      Years since quitting: 3.2   Smokeless tobacco: Never   Tobacco comments:      smoked less than 1 ppd for 40+ years;   Substance Use Topics   Alcohol use: Yes      Alcohol/week: 0.0 standard drinks      Comment: Occasionally,not a heavy drinker            Allergies  Allergen Reactions   Celebrex [Celecoxib] Hives and Itching   Tape        Other reaction(s): Other (See Comments) Tears skin. Prefers paper tape            Current Outpatient Medications  Medication Sig Dispense Refill   allopurinol (ZYLOPRIM) 100 MG tablet Take 2 tablets (200 mg total) by mouth daily. 180 tablet 0   amLODipine (NORVASC) 10 MG tablet TAKE 1 TABLET(10 MG) BY MOUTH DAILY 90 tablet 3   aspirin 81 MG EC tablet Take 81 mg by mouth at bedtime.       atorvastatin (LIPITOR) 80 MG tablet Take 1 tablet (80 mg total) by mouth daily. 90 tablet 3   carvedilol (COREG) 12.5  MG tablet Take 1 tablet (12.5 mg total) by mouth 2 (two) times daily. 60 tablet 11    citalopram (CELEXA) 20 MG tablet Take 1 tablet (20 mg total) by mouth daily. 30 tablet 5   clopidogrel (PLAVIX) 75 MG tablet Take 75 mg by mouth daily.       ezetimibe (ZETIA) 10 MG tablet Take 1 tablet (10 mg total) by mouth daily. 30 tablet 11   fluticasone (FLONASE) 50 MCG/ACT nasal spray SHAKE LIQUID AND USE 2 SPRAYS IN EACH NOSTRIL DAILY AS NEEDED FOR RHINITIS OR ALLERGIES 16 g 11   furosemide (LASIX) 20 MG tablet Take 1 tablet (20 mg total) by mouth daily. 90 tablet 3   gabapentin (NEURONTIN) 300 MG capsule TAKE 1 CAPSULE(300 MG) BY MOUTH THREE TIMES DAILY 90 capsule 5   isosorbide mononitrate (IMDUR) 30 MG 24 hr tablet TAKE 1 TABLET(30 MG) BY MOUTH AT BEDTIME 90 tablet 1   lisinopril (ZESTRIL) 20 MG tablet TAKE 1 TABLET(10 MG) BY MOUTH DAILY 90 tablet 3   mirtazapine (REMERON) 30 MG tablet TAKE 1 TABLET BY MOUTH EVERY NIGHT AT BEDTIME 30 tablet 5   oxyCODONE (OXY IR/ROXICODONE) 5 MG immediate release tablet Take 1-2 tablets (5-10 mg total) by mouth every 6 (six) hours as needed for severe pain (to take 2 tabs ONLY when severe pain- which is not usual for pt). 120 tablet 0   pantoprazole (PROTONIX) 40 MG tablet Take 1 tablet (40 mg total) by mouth daily. 90 tablet 3   potassium chloride (KLOR-CON) 10 MEQ tablet Take 2 tablets (20 mEq total) by mouth daily. 180 tablet 3   predniSONE (DELTASONE) 1 MG tablet Take 4 tablets (4 mg total) by mouth daily with breakfast. 120 tablet 1   tamsulosin (FLOMAX) 0.4 MG CAPS capsule Take 0.4 mg by mouth at bedtime.       traMADol (ULTRAM) 50 MG tablet TAKE 2 TABLETS(100 MG) BY MOUTH TWICE DAILY 120 tablet 5    No current facility-administered medications for this visit.      Review of Systems  Constitutional: Positive for unexpected weight change.  HENT: HENT negative.  Eyes: Eyes negative.  Respiratory: Respiratory negative.  Cardiovascular: Cardiovascular negative.  GI: Gastrointestinal negative.  Musculoskeletal: Musculoskeletal negative.  Skin:  Skin negative.  Neurological: Neurological negative. Hematologic: Hematologic/lymphatic negative.  Psychiatric: Psychiatric negative.         Objective:    Vitals:   03/03/21 0553  BP: 139/62  Pulse: 66  Resp: 17  Temp: 98.2 F (36.8 C)  SpO2: 96%    Physical Exam HENT:     Head: Normocephalic.     Nose:     Comments: Wearing a mask Eyes:     Pupils: Pupils are equal, round, and reactive to light.  Cardiovascular:     Pulses:          Popliteal pulses are 2+ on the right side and 2+ on the left side.       Posterior tibial pulses are 2+ on the right side and 2+ on the left side.  Pulmonary:     Effort: Pulmonary effort is normal.  Abdominal:     General: Abdomen is flat.     Palpations: Abdomen is soft. There is no mass.  Musculoskeletal:     Cervical back: Normal range of motion and neck supple.     Right lower leg: No edema.     Left lower leg: No edema.  Skin:    General: Skin is  warm.     Capillary Refill: Capillary refill takes less than 2 seconds.  Neurological:     General: No focal deficit present.     Mental Status: He is alert.  Psychiatric:        Mood and Affect: Mood normal.        Behavior: Behavior normal.        Thought Content: Thought content normal.        Judgment: Judgment normal.      Data: CT IMPRESSION: VASCULAR   1. Slight interval increased size of previously visualized fusiform infrarenal abdominal aortic aneurysm which now measures up to 5.2 cm (previously 4.9 cm measurements). Recommend referral to a Vascular Specialist if not already established. This recommendation follows ACR consensus guidelines: White Paper of the ACR Incidental Findings Committee II on Vascular Findings. J Am Coll Radiol 2013; 10:789-794. 2. 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        Assessment/Plan:     81 year old male with  abdominal aortic aneurysm status post TAVR.  I have measured his aneurysm at 5.5 cm today by my measurements was 5.2 by CT scan read.  We have discussed his options being continued watchful waiting versus repair.  He appears to be a candidate for endovascular repair.  I have discussed the specifics of the operation including the need for bilateral common femoral access, placement of stent, need for general anesthesia, plan for 1 night hospital stay.  We discussed the risks of the operation including to his heart given his extensive history, risk of general anesthesia, risk of blood loss requiring transfusion, risk of need for conversion to open operation, risk of groin issue requiring open common femoral artery repair and/or repair of his external or common iliac arteries.  He demonstrates good understanding.         Waynetta Sandy MD [facility]

## 2021-03-03 NOTE — Anesthesia Procedure Notes (Signed)
Procedure Name: Intubation Date/Time: 03/03/2021 7:50 AM Performed by: Lorie Phenix, CRNA Pre-anesthesia Checklist: Patient identified, Emergency Drugs available, Suction available and Patient being monitored Patient Re-evaluated:Patient Re-evaluated prior to induction Oxygen Delivery Method: Circle system utilized Preoxygenation: Pre-oxygenation with 100% oxygen Induction Type: IV induction Ventilation: Mask ventilation without difficulty and Oral airway inserted - appropriate to patient size Laryngoscope Size: Glidescope and 4 Grade View: Grade I Tube type: Oral Number of attempts: 1 Airway Equipment and Method: Rigid stylet Placement Confirmation: ETT inserted through vocal cords under direct vision, positive ETCO2 and breath sounds checked- equal and bilateral Secured at: 23 cm Tube secured with: Tape Dental Injury: Teeth and Oropharynx as per pre-operative assessment

## 2021-03-03 NOTE — Progress Notes (Signed)
°  Day of Surgery Note    Subjective:  no complaints; wants to know how big the incisions are.   Vitals:   03/03/21 0553 03/03/21 0918  BP: 139/62 127/66  Pulse: 66 67  Resp: 17 18  Temp: 98.2 F (36.8 C) 97.8 F (36.6 C)  SpO2: 96% 96%    Incisions:   bilateral groins are soft without hematoma Extremities:  bilateral DP pulses are palpable Cardiac:  regular Lungs:  non labored Abdomen:  soft   Assessment/Plan:  This is a 81 y.o. male who is s/p  EVAR  -pt doing well in pacu with palpable DP pulses.  -if no events overnight, anticipate dc home tomorrow. -PDMP reviewed   Leontine Locket, PA-C 03/03/2021 9:45 AM [phone]

## 2021-03-03 NOTE — Op Note (Addendum)
Patient name: Brandon [name] MRN: [id number] DOB: 01-Jul-1940 Sex: male  03/03/2021 Pre-operative Diagnosis: AAA Post-operative diagnosis:  Same Surgeon:  Erlene Quan C. Donzetta Matters, MD Assistant: Leontine Locket, PA Procedure Performed: 1.  Endovascular abdominal aortic aneurysm repair with main body right 26 x 14 x 12 extended with 12 x 10 cm limb and contralateral limb 12 x 12 cm 2.  Percutaneous access and closure bilateral common femoral arteries  Indications: 81 year old male with 5.5 cm abdominal aortic aneurysm.  We discussed the risk benefits alternatives and he agrees to proceed.  An experienced assistant was required given the complexity of this procedure and the standard of surgical care.  She helped with exposure using holding pressure during exchange of sheaths and holding wires during passing of devices.  Findings: There was approximately 2.5 cm neck with left renal artery the lowest.  There was tight bifurcation particularly from the right that required balloon dilatation at completion.  There was good seal proximally and distally without any type I endoleak's there were type II endoleak's from an IMA and a lumbar.  There were good signals bilateral feet at completion.   Procedure:  The patient was identified in the holding area and taken to the operating was placed supine on the operative when general anesthesia induced.  He was sterilely prepped and draped in the abdomen and groins in the usual fashion, antibiotics were ministered and a timeout was called.  Ultrasound was used to first identify the right common femoral artery which was percutaneously cannulated with micropuncture needle followed by wire and sheath.  Subcutaneous tissue was dilated and the Bentson wire was placed in the tract serially dilated with 8 French sheath.  2 percutaneous closure devices were placed an 8 French sheath placed.  While these were being exchanged.  Assistant did hold pressure.  Attention was then turned to  the left side where similarly the left common femoral artery was identified cannulated with micropuncture needle and sheath and the subcutaneous tissue was dilated with 8 French dilator and 2 percutaneous closure devices were placed in 8 French sheath was placed.  Stiff wires were placed in the abdominal aorta 12 French sheath was placed on the left and a 16 French sheath on the right and the patient was fully heparinized.  The main body was brought to the level of L1 through the right side Omni catheter on the left with the assistant helping hold wires and sheaths in place.  Aortogram was performed.  We open the main body to 50% and performed another contrasted angiography.  We then reconstrained the device moved up several millimeters redeployed it and then again performed angiography which demonstrated that it was now on the left renal artery without any.  The device was fully deployed.  We cannulated the contralateral limb and we were able to remove a Omni catheter within this.  We then performed retrograde angiography through the left deployed the contralateral limb down to the level of the hypogastric artery.  The main body was fully deployed.  Retrograde angiography performed from the right and we extended down with 12 x 10 cm piece.  All parts were dilated with MO B balloon.  Completion angiography demonstrated the type II endoleak's as above without any type I or type III endoleak's.  Satisfied with this we change her Bentson wires on both sides.  Percutaneous closure devices were deployed satisfactorily.  There were good signals of both feet.  50 mg protamine was administered.  He was  awakened from anesthesia having tolerated procedure without immediate complication   EBL: 18HU  Brandon [name] C. Donzetta Matters, MD [facility] [place] Office: [phone] Pager: [phone]

## 2021-03-03 NOTE — Anesthesia Postprocedure Evaluation (Signed)
Anesthesia Post Note  Patient: Brandon [name]  Procedure(s) Performed: ABDOMINAL AORTIC ENDOVASCULAR STENT GRAFT (Abdomen) ULTRASOUND GUIDANCE FOR VASCULAR ACCESS, BILATERAL FEMORAL ARTERIES (Bilateral: Groin)     Patient location during evaluation: PACU Anesthesia Type: General Level of consciousness: awake and alert Pain management: pain level controlled Vital Signs Assessment: post-procedure vital signs reviewed and stable Respiratory status: spontaneous breathing, nonlabored ventilation and respiratory function stable Cardiovascular status: blood pressure returned to baseline and stable Postop Assessment: no apparent nausea or vomiting Anesthetic complications: no   No notable events documented.  Last Vitals:  Vitals:   03/03/21 1117 03/03/21 1130  BP: 120/65   Pulse: 67 73  Resp: 14 17  Temp:  36.6 C  SpO2: 95% 96%    Last Pain:  Vitals:   03/03/21 1117  TempSrc:   PainSc: Asleep                 [name],W. EDMOND

## 2021-03-03 NOTE — Anesthesia Procedure Notes (Signed)
Arterial Line Insertion Start/End1/27/2023 7:00 AM, 03/03/2021 7:10 AM Performed by: CRNA  Patient location: Pre-op. Preanesthetic checklist: patient identified, IV checked, site marked, risks and benefits discussed, surgical consent, monitors and equipment checked, pre-op evaluation, timeout performed and anesthesia consent Lidocaine 1% used for infiltration and patient sedated Right, radial was placed Catheter size: 20 G Hand hygiene performed  and maximum sterile barriers used   Attempts: 1 Procedure performed without using ultrasound guided technique. Following insertion, Biopatch and dressing applied. Post procedure assessment: normal  Patient tolerated the procedure well with no immediate complications.

## 2021-03-03 NOTE — Transfer of Care (Signed)
Immediate Anesthesia Transfer of Care Note  Patient: Brandon [name]  Procedure(s) Performed: ABDOMINAL AORTIC ENDOVASCULAR STENT GRAFT (Abdomen) ULTRASOUND GUIDANCE FOR VASCULAR ACCESS, BILATERAL FEMORAL ARTERIES (Bilateral: Groin)  Patient Location: PACU  Anesthesia Type:General  Level of Consciousness: awake and alert   Airway & Oxygen Therapy: Patient Spontanous Breathing and Patient connected to face mask oxygen  Post-op Assessment: Report given to RN and Post -op Vital signs reviewed and stable  Post vital signs: Reviewed and stable  Last Vitals:  Vitals Value Taken Time  BP 127/66 03/03/21 0918  Temp    Pulse 66 03/03/21 0919  Resp 17 03/03/21 0919  SpO2 95 % 03/03/21 0919  Vitals shown include unvalidated device data.  Last Pain:  Vitals:   03/03/21 0615  TempSrc:   PainSc: 0-No pain         Complications: No notable events documented.

## 2021-03-03 NOTE — Discharge Instructions (Signed)
  [facility] [place]   Discharge Instructions  Endovascular Aortic Aneurysm Repair  Please refer to the following instructions for your post-procedure care. Your surgeon or Physician Assistant will discuss any changes with you.  Activity  You are encouraged to walk as much as you can. You can slowly return to normal activities but must avoid strenuous activity and heavy lifting until your doctor tells you it's OK. Avoid activities such as vacuuming or swinging a gold club. It is normal to feel tired for several weeks after your surgery. Do not drive until your doctor gives the OK and you are no longer taking prescription pain medications. It is also normal to have difficulty with sleep habits, eating, and bowel movements after surgery. These will go away with time.  Bathing/Showering  Shower daily after you go home.  Do not soak in a bathtub, hot tub, or swim until the incision heals completely.  If you have incisions in your groin, wash the groin wounds with soap and water daily and pat dry. (No tub bath-only shower)  Then put a dry gauze or washcloth there to keep this area dry to help prevent wound infection daily and as needed.  Do not use Vaseline or neosporin on your incisions.  Only use soap and water on your incisions and then protect and keep dry.  Incision Care  Shower every day. Clean your incision with mild soap and water. Pat the area dry with a clean towel. You do not need a bandage unless otherwise instructed. Do not apply any ointments or creams to your incision. If you clothing is irritating, you may cover your incision with a dry gauze pad.  Diet  Resume your normal diet. There are no special food restrictions following this procedure. A low fat/low cholesterol diet is recommended for all patients with vascular disease. In order to heal from your surgery, it is CRITICAL to get adequate nutrition. Your body requires vitamins, minerals, and protein.  Vegetables are the best source of vitamins and minerals. Vegetables also provide the perfect balance of protein. Processed food has little nutritional value, so try to avoid this.  Medications  Resume taking all of your medications unless your doctor or nurse practitioner tells you not to. If your incision is causing pain, you may take over-the-counter pain relievers such as acetaminophen (Tylenol). If you were prescribed a stronger pain medication, please be aware these medications can cause nausea and constipation. Prevent nausea by taking the medication with a snack or meal. Avoid constipation by drinking plenty of fluids and eating foods with a high amount of fiber, such as fruits, vegetables, and grains.  Do not take Tylenol if you are taking prescription pain medications.   Follow up  Our office will schedule a follow-up appointment with a CT scan 3-4 weeks after your surgery.  Please call us immediately for any of the following conditions  Severe or worsening pain in your legs or feet or in your abdomen back or chest. Increased pain, redness, drainage (pus) from your incision site. Increased abdominal pain, bloating, nausea, vomiting or persistent diarrhea. Fever of 101 degrees or higher. Swelling in your leg (s),  Reduce your risk of vascular disease  Stop smoking. If you would like help call [facility] at 1-800-QUIT-NOW (1-800-784-8669) or [facility] at [phone]. Manage your cholesterol Maintain a desired weight Control your diabetes Keep your blood pressure down  If you have questions, please call the office at [phone].  

## 2021-03-04 LAB — CBC
HCT: 30 % — ABNORMAL LOW (ref 39.0–52.0)
Hemoglobin: 8.8 g/dL — ABNORMAL LOW (ref 13.0–17.0)
MCH: 23.2 pg — ABNORMAL LOW (ref 26.0–34.0)
MCHC: 29.3 g/dL — ABNORMAL LOW (ref 30.0–36.0)
MCV: 79.2 fL — ABNORMAL LOW (ref 80.0–100.0)
Platelets: 134 10*3/uL — ABNORMAL LOW (ref 150–400)
RBC: 3.79 MIL/uL — ABNORMAL LOW (ref 4.22–5.81)
RDW: 14.6 % (ref 11.5–15.5)
WBC: 8 10*3/uL (ref 4.0–10.5)
nRBC: 0 % (ref 0.0–0.2)

## 2021-03-04 LAB — BASIC METABOLIC PANEL
Anion gap: 8 (ref 5–15)
BUN: 8 mg/dL (ref 8–23)
CO2: 30 mmol/L (ref 22–32)
Calcium: 8.6 mg/dL — ABNORMAL LOW (ref 8.9–10.3)
Chloride: 101 mmol/L (ref 98–111)
Creatinine, Ser: 0.67 mg/dL (ref 0.61–1.24)
GFR, Estimated: >60 mL/min (ref >60)
Glucose, Bld: 133 mg/dL — ABNORMAL HIGH (ref 70–99)
Potassium: 3.9 mmol/L (ref 3.5–5.1)
Sodium: 139 mmol/L (ref 135–145)

## 2021-03-04 LAB — POCT ACTIVATED CLOTTING TIME: Activated Clotting Time: 197 seconds

## 2021-03-04 LAB — TOXASSURE SELECT,+ANTIDEPR,UR

## 2021-03-04 NOTE — Discharge Summary (Signed)
[facility]AR Discharge Summary   BARUC TUGWELL March 20, 1940 81 y.o. male  MRN: [id number]  Admission Date: 03/03/2021  Discharge Date: 03/04/2021  Physician: Thomes Lolling*  Admission Diagnosis: AAA (abdominal aortic aneurysm) [I71.40]   HPI:   This is a 81 y.o. male with history of known abdominal aortic aneurysm.  He recently underwent TAVR.  He is followed by Dr. Aundra Dubin with cardiology.  He is on steroids for rheumatoid arthritis with recent weight gain of approximately 20 pounds.  He does not have any new back or abdominal pain. Recently underwent left hip surgery for fracture.   Hospital Course:  The patient was admitted to the hospital and taken to the operating room on 03/03/2021 and underwent: 1.  Endovascular abdominal aortic aneurysm repair with main body right 26 x 14 x 12 extended with 12 x 10 cm limb and contralateral limb 12 x 12 cm 2.  Percutaneous access and closure bilateral common femoral arteries    Findings:  There was approximately 2.5 cm neck with left renal artery the lowest.  There was tight bifurcation particularly from the right that required balloon dilatation at completion.  There was good seal proximally and distally without any type I endoleak's there were type II endoleak's from an IMA and a lumbar.  There were good signals bilateral feet at completion.  The pt tolerated the procedure well and was transported to the PACU in good condition.   By POD 1, pt was doing well with palpable DP pulses bilaterally and bilateral groins soft without hematoma.  He has ambulated and voided.  He has pain management contract so no narcotics prescribed.    CBC    Component Value Date/Time   WBC 8.0 03/04/2021 0308   RBC 3.79 (L) 03/04/2021 0308   HGB 8.8 (L) 03/04/2021 0308   HGB 11.7 (L) 04/20/2020 1245   HCT 30.0 (L) 03/04/2021 0308   HCT 37.4 (L) 04/20/2020 1245   PLT 134 (L) 03/04/2021 0308   PLT 158 04/20/2020 1245   MCV 79.2 (L) 03/04/2021 0308   MCV  81 04/20/2020 1245   MCH 23.2 (L) 03/04/2021 0308   MCHC 29.3 (L) 03/04/2021 0308   RDW 14.6 03/04/2021 0308   RDW 14.2 04/20/2020 1245   LYMPHSABS 490 (L) 09/08/2020 1407   LYMPHSABS 0.6 (L) 04/20/2020 1245   MONOABS 0.7 07/01/2020 0144   EOSABS 107 09/08/2020 1407   EOSABS 0.3 04/20/2020 1245   BASOSABS 9 09/08/2020 1407   BASOSABS 0.0 04/20/2020 1245    BMET    Component Value Date/Time   NA 139 03/04/2021 0308   NA 138 06/15/2020 1413   K 3.9 03/04/2021 0308   CL 101 03/04/2021 0308   CO2 30 03/04/2021 0308   GLUCOSE 133 (H) 03/04/2021 0308   BUN 8 03/04/2021 0308   BUN 19 06/15/2020 1413   CREATININE 0.67 03/04/2021 0308   CREATININE 0.77 09/08/2020 1407   CALCIUM 8.6 (L) 03/04/2021 0308   GFRNONAA >60 03/04/2021 0308   GFRNONAA 92 08/11/2020 1024   GFRAA 107 08/11/2020 1024       Discharge Instructions     Discharge patient   Complete by: As directed    Discharge home after pt has ambulated in hallways and voided and has been seen by Dr. Donzetta Matters.   Discharge disposition: 01-Home or Self Care   Discharge patient date: 03/04/2021       Discharge Diagnosis:  AAA (abdominal aortic aneurysm) [I71.40]  Secondary Diagnosis: Patient Active Problem List  Diagnosis Date Noted   AAA (abdominal aortic aneurysm) 03/03/2021   Chronic allergic rhinitis 01/24/2021   Acute blood loss anemia    Demand ischemia (HCC)    Acute on chronic respiratory failure with hypoxia (HCC)    Elevated troponin    Left displaced femoral neck fracture ([place]) 12/03/2020   Fall at home, initial encounter 12/03/2020   Chronic diastolic heart failure ([place]) 12/03/2020   Steatosis of liver 11/01/2020   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   Hepatitis B core antibody positive 11/01/2020   Benign prostatic hyperplasia 11/01/2020   Chronic obstructive pulmonary disease ([place]) 07/20/2020   History of placement of stent in LAD coronary artery 07/20/2020   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/2020   TIA (transient ischemic attack)    Esophageal stricture    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   Primary osteoarthritis of both hands 01/11/2020   Piriformis syndrome of both sides 09/23/2019   Pain of both shoulder joints 07/29/2019   Sciatica associated with disorder of lumbar spine 04/24/2019   Primary osteoarthritis involving multiple joints 04/24/2019   Chronic pain syndrome 04/24/2019   Anxiety and depression 04/15/2018   Oropharyngeal dysphagia 03/11/2018   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   Liver abscess 07/10/2016   Aneurysm of infrarenal abdominal aorta 07/10/2016   Hypochromic anemia 07/10/2016   Hypertension 05/19/2013   Osteoarthritis 05/29/2009   Psoriasis 01/13/2009   Coronary artery disease 09/15/2008   Severe aortic stenosis 01/28/2008   Diverticulosis of colon 08/27/2007   History of benign prostatic hyperplasia 08/27/2007   Hyperlipidemia 03/19/2007   Gastroesophageal reflux disease 03/19/2007   Past Medical History:  Diagnosis Date   Abdominal aneurysm    Arthritis    "all over" (07/19/2016)   BPH (benign prostatic hypertrophy)    CAD (coronary artery disease)    Chicken pox    Chronic lower back pain    s/p surgical fusion   Depression    Diverticulosis    Esophageal stricture    GERD (gastroesophageal reflux disease)    Hepatitis B 1984   Hiatal hernia    History of radiation therapy 01/16/18- 03/05/18   Left Tonsil, 66 Gy in 33 fractions to high risk nodal echelons.    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 Burt Knack and Dr Cyndia Bent    Severe aortic stenosis    TIA (transient ischemic attack) 1990s   hx   tonsillar ca dx'd 11/2017   Tubular adenoma of colon 2009      Allergies as of 03/04/2021       Reactions   Celebrex [celecoxib] Hives, Itching   Tape    Other reaction(s): Other (See Comments) Tears skin. Prefers paper tape        Medication List     TAKE these medications    albuterol 108 (90 Base) MCG/ACT inhaler Commonly known as: VENTOLIN HFA Inhale 2 puffs into the lungs every 6 (six) hours as needed for wheezing or shortness of breath.   alendronate 70 MG tablet Commonly known as: Fosamax Take 1 tablet (70 mg total) by mouth once a week. Take with a full glass of water on an empty stomach.   allopurinol 100 MG tablet Commonly known as: ZYLOPRIM TAKE 2 TABLETS(200  MG) BY MOUTH DAILY   aspirin EC 81 MG tablet Take 81 mg by mouth daily. Swallow whole.   atorvastatin 80 MG tablet Commonly known as: LIPITOR Take 1 tablet (80 mg total) by mouth daily.   carvedilol 12.5 MG tablet Commonly known as: Coreg Take 1 tablet (12.5 mg total) by mouth 2 (two) times daily.   citalopram 20 MG tablet Commonly known as: CELEXA TAKE 1 TABLET(20 MG) BY MOUTH DAILY   clopidogrel 75 MG tablet Commonly known as: PLAVIX Take 75 mg by mouth daily.   ezetimibe 10 MG tablet Commonly known as: Zetia Take 1 tablet (10 mg total) by mouth daily.   fluticasone 50 MCG/ACT nasal spray Commonly known as: FLONASE SHAKE LIQUID AND USE 2 SPRAYS IN EACH NOSTRIL DAILY AS NEEDED FOR RHINITIS OR ALLERGIES   furosemide 20 MG tablet Commonly known as: LASIX Take 1 tablet (20 mg total) by mouth daily.   gabapentin 300 MG capsule Commonly known as: NEURONTIN TAKE 1 CAPSULE(300 MG) BY MOUTH THREE TIMES DAILY What changed: See the new instructions.   ipratropium 0.03 % nasal spray Commonly known as: ATROVENT Place 2 sprays into both nostrils every 12 (twelve) hours.   lisinopril 20 MG tablet Commonly known as: ZESTRIL Take 20 mg by mouth daily.   Oxycodone HCl 10 MG Tabs Take 1 tablet (10 mg total) by mouth every 6 (six) hours as needed.    pantoprazole 40 MG tablet Commonly known as: PROTONIX Take 1 tablet (40 mg total) by mouth daily.   potassium chloride 10 MEQ tablet Commonly known as: KLOR-CON Take 2 tablets (20 mEq total) by mouth daily. What changed: how much to take   predniSONE 5 MG tablet Commonly known as: DELTASONE Take 1 tablet (5 mg total) by mouth daily with breakfast.   predniSONE 1 MG tablet Commonly known as: DELTASONE Take 4 tablets by mouth daily along with a 5mg  tab for a total of 9mg  daily for 2 months. Then reduce to 8mg  by mouth daily.   pregabalin 50 MG capsule Commonly known as: Lyrica Take 1 capsule (50 mg total) by mouth 2 (two) times daily. X 1 week- then 100 mg 2x/day- for nerve pain- to try and improve nerve pain   Spiriva Respimat 2.5 MCG/ACT Aers Generic drug: Tiotropium Bromide Monohydrate Inhale 2 puffs into the lungs daily. What changed:  when to take this reasons to take this   tamsulosin 0.4 MG Caps capsule Commonly known as: FLOMAX Take 0.4 mg by mouth in the morning and at bedtime.   traMADol 50 MG tablet Commonly known as: Ultram Take 2 tablets (100 mg total) by mouth 2 (two) times daily.   Vitamin D (Ergocalciferol) 1.25 MG (50000 UNIT) Caps capsule Commonly known as: DRISDOL Take 1 capsule (50,000 Units total) by mouth every 7 (seven) days.        Discharge Instructions:  [facility]  Discharge Instructions Endovascular Aortic Aneurysm Repair  Please refer to the following instructions for your post-procedure care. Your surgeon or Physician Assistant will discuss any changes with you.  Activity  You are encouraged to walk as much as you can. You can slowly return to normal activities but must avoid strenuous activity and heavy lifting until your doctor tells you it's OK. Avoid activities such as vacuuming or swinging a gold club. It is normal to feel tired for several weeks after your surgery. Do not drive until your doctor  gives the OK and you are no  longer taking prescription pain medications. It is also normal to have difficulty with sleep habits, eating, and bowel movements after surgery. These will go away with time.  Bathing/Showering  You may shower after you go home. If you have an incision, do not soak in a bathtub, hot tub, or swim until the incision heals completely.  Incision Care  Shower every day. Clean your incision with mild soap and water. Pat the area dry with a clean towel. You do not need a bandage unless otherwise instructed. Do not apply any ointments or creams to your incision. If you clothing is irritating, you may cover your incision with a dry gauze pad.  Diet  Resume your normal diet. There are no special food restrictions following this procedure. A low fat/low cholesterol diet is recommended for all patients with vascular disease. In order to heal from your surgery, it is CRITICAL to get adequate nutrition. Your body requires vitamins, minerals, and protein. Vegetables are the best source of vitamins and minerals. Vegetables also provide the perfect balance of protein. Processed food has little nutritional value, so try to avoid this.  Medications  Resume taking all of your medications unless your doctor or Physician Assistnat tells you not to. If your incision is causing pain, you may take over-the-counter pain relievers such as acetaminophen (Tylenol). If you were prescribed a stronger pain medication, please be aware these medications can cause nausea and constipation. Prevent nausea by taking the medication with a snack or meal. Avoid constipation by drinking plenty of fluids and eating foods with a high amount of fiber, such as fruits, vegetables, and grains.  Do not take Tylenol if you are taking prescription pain medications.   Follow up  [place] office will schedule a follow-up appointment with a C.T. scan 3-4 weeks after your surgery.  Please call us immediately for any of the  following conditions  Severe or worsening pain in your legs or feet or in your abdomen back or chest. Increased pain, redness, drainage (pus) from your incision sit. Increased abdominal pain, bloating, nausea, vomiting or persistent diarrhea. Fever of 101 degrees or higher. Swelling in your leg (s),  Reduce your risk of vascular disease  Stop smoking. If you would like help call [facility] at 1-800-QUIT-NOW 218-215-9020) or [place] at [phone]. Manage your cholesterol Maintain a desired weight Control your diabetes Keep your blood pressure down  If you have questions, please call the office at [phone].   Prescriptions given: None due to pain management contract  Disposition: home  Patient's condition: is Good  Follow up: 1. Dr. Donzetta Matters in 4 weeks with CTA protocol   Leontine Locket, PA-C [facility] [phone] 03/04/2021  8:27 AM   - For VQI Registry use - Post-op:  Time to Extubation: [x]  In OR, [ ]  < 12 hrs, [ ]  12-24 hrs, [ ]  >=24 hrs Vasopressors Req. Post-op: No MI: No., [ ]  Troponin only, [ ]  EKG or Clinical New Arrhythmia: No CHF: No ICU Stay: 1 day in progressive unit Transfusion: No     If yes, n/a units given  Complications: Resp failure: No., [ ]  Pneumonia, [ ]  Ventilator Chg in renal function: No., [ ]  Inc. Cr > 0.5, [ ]  Temp. Dialysis,  [ ]  Permanent dialysis Leg ischemia: No., no Surgery needed, [ ]  Yes, Surgery needed,  [ ]  Amputation Bowel ischemia: No., [ ]  Medical Rx, [ ]  Surgical Rx Wound complication: No., [ ]  Superficial separation/infection, [ ]  Return to OR  Return to OR: No  Return to OR for bleeding: No Stroke: No., [ ]  Minor, [ ]  Major  Discharge medications: Statin use:  Yes  ASA use:  Yes  Plavix use:  Yes  Beta blocker use:  Yes  ARB use:  No ACEI use:  Yes CCB use:  No

## 2021-03-04 NOTE — Progress Notes (Signed)
Nursing dc note  Patient alert and oriented. Verbalized understanding of dc instructions. All belongings given to patient.

## 2021-03-04 NOTE — Progress Notes (Addendum)
°  Progress Note    03/04/2021 7:17 AM 1 Day Post-Op  Subjective:  says he has voided.  Wants to have BM before he leaves.  Says laying in the bed makes his back pain worse.  Afebrile HR 60's-70's  409'W-119'J systolic 47% RA  Vitals:   03/03/21 2348 03/04/21 0331  BP: (!) 144/71 126/69  Pulse: 75 72  Resp: 17 17  Temp: 98.3 F (36.8 C) 97.6 F (36.4 C)  SpO2: 93% 94%    Physical Exam: Cardiac:  regular Lungs:  non labored Incisions:  bilateral groins are soft without hematoma Extremities:  palpable DP pulses bilaterally Abdomen:  soft NT  CBC    Component Value Date/Time   WBC 8.0 03/04/2021 0308   RBC 3.79 (L) 03/04/2021 0308   HGB 8.8 (L) 03/04/2021 0308   HGB 11.7 (L) 04/20/2020 1245   HCT 30.0 (L) 03/04/2021 0308   HCT 37.4 (L) 04/20/2020 1245   PLT 134 (L) 03/04/2021 0308   PLT 158 04/20/2020 1245   MCV 79.2 (L) 03/04/2021 0308   MCV 81 04/20/2020 1245   MCH 23.2 (L) 03/04/2021 0308   MCHC 29.3 (L) 03/04/2021 0308   RDW 14.6 03/04/2021 0308   RDW 14.2 04/20/2020 1245   LYMPHSABS 490 (L) 09/08/2020 1407   LYMPHSABS 0.6 (L) 04/20/2020 1245   MONOABS 0.7 07/01/2020 0144   EOSABS 107 09/08/2020 1407   EOSABS 0.3 04/20/2020 1245   BASOSABS 9 09/08/2020 1407   BASOSABS 0.0 04/20/2020 1245    BMET    Component Value Date/Time   NA 139 03/04/2021 0308   NA 138 06/15/2020 1413   K 3.9 03/04/2021 0308   CL 101 03/04/2021 0308   CO2 30 03/04/2021 0308   GLUCOSE 133 (H) 03/04/2021 0308   BUN 8 03/04/2021 0308   BUN 19 06/15/2020 1413   CREATININE 0.67 03/04/2021 0308   CREATININE 0.77 09/08/2020 1407   CALCIUM 8.6 (L) 03/04/2021 0308   GFRNONAA >60 03/04/2021 0308   GFRNONAA 92 08/11/2020 1024   GFRAA 107 08/11/2020 1024    INR    Component Value Date/Time   INR 1.1 03/03/2021 1004     Intake/Output Summary (Last 24 hours) at 03/04/2021 0717 Last data filed at 03/04/2021 0519 Gross per 24 hour  Intake 2401.31 ml  Output 2595 ml  Net  -193.69 ml     Assessment/Plan:  81 y.o. male is s/p:  1.  Endovascular abdominal aortic aneurysm repair with main body right 26 x 14 x 12 extended with 12 x 10 cm limb and contralateral limb 12 x 12 cm 2.  Percutaneous access and closure bilateral common femoral arteries  1 Day Post-Op   -pt doing well with palpable DP pulses bilaterally and bilateral groins are soft without hematoma.  Renal function normal. -he has ambulated to the bathroom and has voided.  -will discharge home later this morning (he is wanting to have BM prior to discharge). -he will f/u in 4 weeks with CTA protocol. -no narcotics prescribed at discharge as he is under pain management contract.  -DVT prophylaxis:  sq heparin   Leontine Locket, PA-C [facility] [phone] 03/04/2021 7:17 AM  I have independently interviewed and examined patient and agree with PA assessment and plan above.   Averi Cacioppo C. Donzetta Matters, MD [facility] [place] Office: [phone] Pager: [phone]

## 2021-03-06 LAB — TYPE AND SCREEN
ABO/RH(D): O POS
Antibody Screen: NEGATIVE
Unit division: 0
Unit division: 0
Unit division: 0
Unit division: 0

## 2021-03-06 LAB — BPAM RBC
Blood Product Expiration Date: 202302162359
Blood Product Expiration Date: 202302162359
Blood Product Expiration Date: 202302222359
Blood Product Expiration Date: 202302272359
ISSUE DATE / TIME: 202301231126
ISSUE DATE / TIME: 202301270805
ISSUE DATE / TIME: 202301271119
Unit Type and Rh: 5100
Unit Type and Rh: 5100
Unit Type and Rh: 5100
Unit Type and Rh: 5100

## 2021-03-07 ENCOUNTER — Encounter (HOSPITAL_COMMUNITY): Payer: Self-pay | Admitting: Vascular Surgery

## 2021-03-07 ENCOUNTER — Telehealth: Payer: Self-pay | Admitting: *Deleted

## 2021-03-07 DIAGNOSIS — S72032D Displaced midcervical fracture of left femur, subsequent encounter for closed fracture with routine healing: Secondary | ICD-10-CM | POA: Diagnosis not present

## 2021-03-07 NOTE — Telephone Encounter (Signed)
Urine drug screen for this encounter is consistent for prescribed medication 

## 2021-03-08 DIAGNOSIS — M5451 Vertebrogenic low back pain: Secondary | ICD-10-CM | POA: Diagnosis not present

## 2021-03-10 ENCOUNTER — Ambulatory Visit: Payer: Medicare Other | Admitting: Physician Assistant

## 2021-03-10 ENCOUNTER — Other Ambulatory Visit: Payer: Self-pay | Admitting: Physician Assistant

## 2021-03-10 DIAGNOSIS — Z8639 Personal history of other endocrine, nutritional and metabolic disease: Secondary | ICD-10-CM

## 2021-03-10 DIAGNOSIS — E559 Vitamin D deficiency, unspecified: Secondary | ICD-10-CM

## 2021-03-10 DIAGNOSIS — R1312 Dysphagia, oropharyngeal phase: Secondary | ICD-10-CM

## 2021-03-10 DIAGNOSIS — G894 Chronic pain syndrome: Secondary | ICD-10-CM

## 2021-03-10 DIAGNOSIS — F32A Depression, unspecified: Secondary | ICD-10-CM

## 2021-03-10 DIAGNOSIS — L409 Psoriasis, unspecified: Secondary | ICD-10-CM

## 2021-03-10 DIAGNOSIS — Z87891 Personal history of nicotine dependence: Secondary | ICD-10-CM

## 2021-03-10 DIAGNOSIS — M1A0210 Idiopathic chronic gout, right elbow, without tophus (tophi): Secondary | ICD-10-CM

## 2021-03-10 DIAGNOSIS — C090 Malignant neoplasm of tonsillar fossa: Secondary | ICD-10-CM

## 2021-03-10 DIAGNOSIS — M8589 Other specified disorders of bone density and structure, multiple sites: Secondary | ICD-10-CM

## 2021-03-10 DIAGNOSIS — I7143 Infrarenal abdominal aortic aneurysm, without rupture: Secondary | ICD-10-CM

## 2021-03-10 DIAGNOSIS — Z96642 Presence of left artificial hip joint: Secondary | ICD-10-CM

## 2021-03-10 DIAGNOSIS — Z7952 Long term (current) use of systemic steroids: Secondary | ICD-10-CM

## 2021-03-10 DIAGNOSIS — Z952 Presence of prosthetic heart valve: Secondary | ICD-10-CM

## 2021-03-10 DIAGNOSIS — M19042 Primary osteoarthritis, left hand: Secondary | ICD-10-CM

## 2021-03-10 DIAGNOSIS — M19041 Primary osteoarthritis, right hand: Secondary | ICD-10-CM

## 2021-03-10 DIAGNOSIS — K750 Abscess of liver: Secondary | ICD-10-CM

## 2021-03-10 DIAGNOSIS — I10 Essential (primary) hypertension: Secondary | ICD-10-CM

## 2021-03-10 DIAGNOSIS — Z8719 Personal history of other diseases of the digestive system: Secondary | ICD-10-CM

## 2021-03-10 DIAGNOSIS — Z79899 Other long term (current) drug therapy: Secondary | ICD-10-CM

## 2021-03-10 DIAGNOSIS — M353 Polymyalgia rheumatica: Secondary | ICD-10-CM

## 2021-03-10 DIAGNOSIS — M5136 Other intervertebral disc degeneration, lumbar region: Secondary | ICD-10-CM

## 2021-03-13 ENCOUNTER — Telehealth: Payer: Self-pay | Admitting: *Deleted

## 2021-03-13 ENCOUNTER — Other Ambulatory Visit: Payer: Self-pay

## 2021-03-13 DIAGNOSIS — I714 Abdominal aortic aneurysm, without rupture, unspecified: Secondary | ICD-10-CM

## 2021-03-13 NOTE — Telephone Encounter (Signed)
Brandon [name] called to let [name] know that he is scheduled to get a back injection to help him with his back pain and hopes he will be abel to cut back on his pain medication after it is done. For now he will be due for a refill on 03/18/21 so he is asking that she send it in to the pharmacy but 03/17/21 (Friday).

## 2021-03-14 MED ORDER — OXYCODONE HCL 10 MG PO TABS: 10.0000 mg | ORAL_TABLET | Freq: Four times a day (QID) | ORAL | 0 refills | Status: DC | PRN

## 2021-03-21 ENCOUNTER — Other Ambulatory Visit (HOSPITAL_COMMUNITY): Payer: Self-pay

## 2021-03-21 MED ORDER — ATORVASTATIN CALCIUM 80 MG PO TABS: 80.0000 mg | ORAL_TABLET | Freq: Every day | ORAL | 3 refills | Status: DC

## 2021-03-24 ENCOUNTER — Other Ambulatory Visit (HOSPITAL_COMMUNITY): Payer: Self-pay

## 2021-03-24 ENCOUNTER — Ambulatory Visit (HOSPITAL_COMMUNITY)
Admission: RE | Admit: 2021-03-24 | Discharge: 2021-03-24 | Disposition: A | Discharge status: Home / Self Care | Payer: Medicare Other | Source: Ambulatory Visit | Attending: Vascular Surgery | Admitting: Vascular Surgery

## 2021-03-24 ENCOUNTER — Encounter (HOSPITAL_COMMUNITY): Payer: Self-pay

## 2021-03-24 ENCOUNTER — Other Ambulatory Visit: Payer: Self-pay

## 2021-03-24 ENCOUNTER — Other Ambulatory Visit: Payer: Self-pay | Admitting: Rheumatology

## 2021-03-24 DIAGNOSIS — K5730 Diverticulosis of large intestine without perforation or abscess without bleeding: Secondary | ICD-10-CM | POA: Diagnosis not present

## 2021-03-24 DIAGNOSIS — I714 Abdominal aortic aneurysm, without rupture, unspecified: Secondary | ICD-10-CM | POA: Insufficient documentation

## 2021-03-24 DIAGNOSIS — Z9049 Acquired absence of other specified parts of digestive tract: Secondary | ICD-10-CM | POA: Diagnosis not present

## 2021-03-24 DIAGNOSIS — K449 Diaphragmatic hernia without obstruction or gangrene: Secondary | ICD-10-CM | POA: Diagnosis not present

## 2021-03-24 MED ORDER — SODIUM CHLORIDE (PF) 0.9 % IJ SOLN
INTRAMUSCULAR | Status: AC
  Filled 2021-03-24: qty 50

## 2021-03-24 MED ORDER — CARVEDILOL 12.5 MG PO TABS: 12.5000 mg | ORAL_TABLET | Freq: Two times a day (BID) | ORAL | 6 refills | Status: DC

## 2021-03-24 MED ORDER — IOHEXOL 350 MG/ML SOLN
100.0000 mL | Freq: Once | INTRAVENOUS | Status: AC | PRN
  Administered 2021-03-24: 100 mL via INTRAVENOUS

## 2021-03-24 NOTE — Progress Notes (Signed)
Office Visit Note  Patient: Brandon [name]             Date of Birth: 05-Aug-1940           MRN: [id number]             PCP: Haydee Salter, MD Referring: Haydee Salter, MD Visit Date: 03/30/2021 Occupation: _0 @  Subjective:  Pain in both hands   History of Present Illness: Brandon [name] is a 81 y.o. male with history of polymyalgia rheumatica, osteoarthritis, and gout.  He is currently on prednisone 9 mg daily and is tapering by 1 mg every 2 months.  He is tolerating prednisone without any side effects.  He denies any signs or symptoms of a polymyalgia rheumatica flare recently.  He has been experiencing increased pain in the left shoulder joint after a fall in October 2022.  He had a MRI of the left shoulder on 12/07/2020 which revealed a tear of the supraspinatus tendon. He plans on following up at [facility] to discuss the next steps in management. He has been experiencing increased pain in both hands.  He denies any joint swelling.  He has been using Aspercreme topically as needed.  He continues to follow-up with pain management and takes oxycodone and tramadol for pain relief. He denies any signs or symptoms of a gout flare.  He is taking allopurinol 200 mg daily. He is tolerating fosamax 70 mg 1 tablet by mouth once weekly which was started in December 2022.  He has not missed any doses of Fosamax recently.  He is taking vitamin D 50,000 units once weekly as advised.  He plans on returning next month to update lab work.  He denies any recent falls or fractures. Patient reports that he had his abdominal aortic aneurysm repaired on 03/03/2021 by Dr. Donzetta Matters.  Activities of Daily Living:  Patient reports morning stiffness for all day. Patient Denies nocturnal pain.  Difficulty dressing/grooming: Denies Difficulty climbing stairs: Denies Difficulty getting out of chair: Denies Difficulty using hands for taps, buttons, cutlery, and/or writing: Reports  Review of Systems   Constitutional:  Negative for fatigue.  HENT:  Negative for mouth sores, mouth dryness and nose dryness.   Eyes:  Positive for dryness. Negative for pain and itching.  Respiratory:  Positive for shortness of breath. Negative for difficulty breathing.   Cardiovascular:  Negative for chest pain and palpitations.  Gastrointestinal:  Negative for blood in stool, constipation and diarrhea.  Endocrine: Negative for increased urination.  Genitourinary:  Negative for difficulty urinating.  Musculoskeletal:  Positive for joint pain, joint pain and morning stiffness. Negative for joint swelling, myalgias, muscle tenderness and myalgias.  Skin:  Negative for color change, rash and redness.  Allergic/Immunologic: Negative for susceptible to infections.  Neurological:  Positive for dizziness, numbness and memory loss. Negative for headaches and weakness.  Hematological:  Positive for bruising/bleeding tendency.  Psychiatric/Behavioral:  Negative for confusion.    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  Hepatitis B core antibody positive 11/01/2020   Benign prostatic hyperplasia 11/01/2020   Chronic obstructive pulmonary disease ([place]) 07/20/2020   History of placement of stent in LAD coronary artery 07/20/2020   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   TIA (transient ischemic attack)    Esophageal stricture    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[place]) 04/19/2020   Primary  osteoarthritis of both hands 01/11/2020   Piriformis syndrome of both sides 09/23/2019   Pain of both shoulder joints 07/29/2019   Sciatica associated with disorder of lumbar spine 04/24/2019   Primary osteoarthritis involving multiple joints 04/24/2019   Chronic pain syndrome 04/24/2019   Anxiety and depression 04/15/2018   Oropharyngeal dysphagia 03/11/2018   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   Liver abscess 07/10/2016   Aneurysm of infrarenal abdominal aorta 07/10/2016   Hypochromic anemia 07/10/2016   Hypertension 05/19/2013   Osteoarthritis 05/29/2009   Psoriasis 01/13/2009   Coronary artery disease 09/15/2008   Severe aortic stenosis 01/28/2008   Diverticulosis of colon 08/27/2007   History of benign prostatic hyperplasia 08/27/2007   Hyperlipidemia 03/19/2007   Gastroesophageal reflux disease 03/19/2007    Past Medical History:  Diagnosis Date   Abdominal aneurysm    Arthritis    "all over" (07/19/2016)   BPH (benign prostatic hypertrophy)    CAD (coronary artery disease)    Chicken pox    Chronic lower back pain    s/p surgical fusion   Depression    Diverticulosis    Esophageal stricture    GERD (gastroesophageal reflux disease)    Hepatitis B 1984   Hiatal hernia    History of radiation therapy 01/16/18- 03/05/18   Left Tonsil, 66 Gy in 33 fractions to high risk nodal echelons.    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 Burt Knack and Dr Cyndia Bent    Severe aortic stenosis    TIA (transient ischemic attack) 1990s   hx   tonsillar ca dx'd 11/2017   Tubular adenoma of colon 2009    Family History  Problem Relation Age of Onset   Heart disease Father 64       Living   Coronary artery disease Father        CABG   Alzheimer's disease Mother 85       Deceased    Arthritis Mother    Aneurysm Brother    Stomach cancer Maternal Uncle    Brain cancer Maternal Aunt        x2   Obesity Daughter        Had Bypass Sx   Past Surgical History:  Procedure Laterality Date   ABDOMINAL AORTIC ENDOVASCULAR STENT GRAFT N/A 03/03/2021   Procedure: ABDOMINAL AORTIC ENDOVASCULAR STENT GRAFT;  Surgeon: Waynetta Sandy, MD;  Location: [facility] [facility];  Service: Vascular;  Laterality: N/A;   BACK SURGERY     CARDIAC CATHETERIZATION     CATARACT EXTRACTION W/ INTRAOCULAR LENS  IMPLANT, BILATERAL Bilateral 01/2012 - 02/2012   CORONARY ATHERECTOMY N/A 04/27/2020   Procedure: CORONARY ATHERECTOMY;  Surgeon: Sherren Mocha, MD;  Location: [place] [facility];  Service: Cardiovascular;  Laterality: N/A;   CORONARY STENT INTERVENTION N/A 04/27/2020   Procedure: CORONARY STENT INTERVENTION;  Surgeon: Sherren Mocha, MD;  Location: [facility]  INVASIVE CV LAB;  Service: Cardiovascular;  Laterality: N/A;   DIRECT LARYNGOSCOPY Left 10/09/2017   Procedure: DIRECT LARYNGOSCOPY WITH BOPSY;  Surgeon: Jodi Marble, MD;  Location: [place];  Service: ENT;  Laterality: Left;   ESOPHAGOGASTRODUODENOSCOPY (EGD) WITH ESOPHAGEAL DILATION     "couple times" (07/19/2016)   ESOPHAGOSCOPY Left 10/09/2017   Procedure: ESOPHAGOSCOPY;  Surgeon: Jodi Marble, MD;  Location: [place];  Service: ENT;  Laterality: Left;   INTRAVASCULAR IMAGING/OCT N/A 04/27/2020   Procedure: INTRAVASCULAR IMAGING/OCT;  Surgeon: Sherren Mocha, MD;  Location: [place] [facility];  Service: Cardiovascular;  Laterality: N/A;   INTRAVASCULAR PRESSURE WIRE/FFR STUDY N/A 04/27/2020   Procedure: INTRAVASCULAR PRESSURE WIRE/FFR STUDY;  Surgeon: Sherren Mocha, MD;  Location: [place] [facility];  Service: Cardiovascular;  Laterality: N/A;   IR GASTROSTOMY TUBE MOD SED  01/08/2018   IR THORACENTESIS ASP PLEURAL SPACE W/IMG GUIDE  07/19/2016   LAPAROSCOPIC CHOLECYSTECTOMY  1984   LUMBAR [place]  SURGERY  05/1996   L4-5; Dr. Claudean Kinds LAMINECTOMY/DECOMPRESSION MICRODISCECTOMY  10/2002   L3-4. Dr. Sherwood Gambler   MULTIPLE TOOTH EXTRACTIONS  1980s   PARTIAL GLOSSECTOMY  12/02/2017   Dr. Nicolette Bang- [facility]   pharyngoplasty for closure of tingue base defect  12/02/2017   Dr. Nicolette Bang- [place]  07/15/2016   POSTERIOR LUMBAR FUSION  09/1996   Ray cage, L4-5 Dr. Rita Ohara   PROSTATE BIOPSY  ~ 2017   radical tonsillectomy Left 12/02/2017   Dr. Nicolette Bang at [place] N/A 03/31/2020   Procedure: RIGHT HEART CATH AND CORONARY ANGIOGRAPHY;  Surgeon: Larey Dresser, MD;  Location: [place] [facility];  Service: Cardiovascular;  Laterality: N/A;   RIGID BRONCHOSCOPY Left 10/09/2017   Procedure: RIGID BRONCHOSCOPY;  Surgeon: Jodi Marble, MD;  Location: [place];  Service: ENT;  Laterality: Left;   TEE WITHOUT CARDIOVERSION N/A 06/07/2020   Procedure: TRANSESOPHAGEAL ECHOCARDIOGRAM (TEE);  Surgeon: Sherren Mocha, MD;  Location: [place];  Service: Open Heart Surgery;  Laterality: N/A;   TONSILLECTOMY     TOTAL HIP ARTHROPLASTY Left 12/04/2020   Procedure: TOTAL HIP ARTHROPLASTY ANTERIOR APPROACH;  Surgeon: Rod Can, MD;  Location: [facility];  Service: Orthopedics;  Laterality: Left;   TOTAL HIP ARTHROPLASTY Left 11/2020   TRACHEOSTOMY  12/02/2017   Dr. Nicolette Bang- [facility]   ULTRASOUND GUIDANCE FOR VASCULAR ACCESS Right 06/07/2020   Procedure: ULTRASOUND GUIDANCE FOR VASCULAR ACCESS;  Surgeon: Sherren Mocha, MD;  Location: [place];  Service: Open Heart Surgery;  Laterality: Right;   ULTRASOUND GUIDANCE FOR VASCULAR ACCESS Bilateral 03/03/2021   Procedure: ULTRASOUND GUIDANCE FOR VASCULAR ACCESS, BILATERAL FEMORAL ARTERIES;  Surgeon: Waynetta Sandy, MD;  Location: [facility];  Service: Vascular;  Laterality: Bilateral;   VASCULAR SURGERY     Social History   Social History Narrative   ** Merged History Encounter  **       Married (3rd), [place]. 2 children from 1st marriage, 4 step children.    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       Cell # [phone]   Immunization History  Administered Date(s) Administered   Fluad Quad(high Dose 65+) 10/15/2018, 11/06/2019, 10/20/2020   Influenza Split 11/26/2010, 10/11/2011, 11/03/2012   Influenza Whole 11/14/2006, 12/04/2007, 10/06/2008, 10/25/2009   Influenza, High Dose Seasonal PF 11/01/2016, 10/30/2017  Influenza, Seasonal, Injecte, Preservative Fre 12/07/2014   Influenza,inj,Quad PF,6+ Mos 11/04/2015   Influenza-Unspecified 11/19/2013   PFIZER(Purple Top)SARS-COV-2 Vaccination 02/25/2019, 03/18/2019, 11/16/2019   Pneumococcal Conjugate-13 12/07/2014   Pneumococcal Polysaccharide-23 05/30/2017   Tdap 01/28/2020     Objective: Vital Signs: BP (!) 160/72 (BP Location: Left Arm, Patient Position: Sitting, Cuff Size: Normal)    Pulse 73    Ht 5' 7" (1.702 m)    Wt 176 lb 12.8 oz (80.2 kg)    BMI 27.69 kg/m    Physical Exam Vitals and nursing note reviewed.  Constitutional:      Appearance: He is well-developed.  HENT:     Head: Normocephalic and atraumatic.  Eyes:     Conjunctiva/sclera: Conjunctivae normal.     Pupils: Pupils are equal, round, and reactive to light.  Cardiovascular:     Rate and Rhythm: Normal rate and regular rhythm.     Heart sounds: Normal heart sounds.  Pulmonary:     Effort: Pulmonary effort is normal.     Breath sounds: Normal breath sounds.  Abdominal:     General: Bowel sounds are normal.     Palpations: Abdomen is soft.  Musculoskeletal:     Cervical back: Normal range of motion and neck supple.  Skin:    General: Skin is warm and dry.     Capillary Refill: Capillary refill takes less than 2 seconds.  Neurological:     Mental Status: He is alert and oriented to person, place, and time.  Psychiatric:        Behavior:  Behavior normal.     Musculoskeletal Exam: C-spine has limited range of motion.  Postural thoracic kyphosis noted.  No midline spinal tenderness or SI joint tenderness upon palpation.  Shoulder joints, elbow joints, and wrist joints have good range of motion with no discomfort.  PIP and DIP thickening consistent with osteoarthritis of both hands.  CMC joint thickening noted bilaterally.  Complete fist formation bilaterally.  Left hip replacement has good range of motion with no discomfort.  Right hip has good range of motion with no discomfort.  Knee joints have good range of motion with no warmth or effusion.  Ankle joints have good range of motion with no tenderness or joint swelling.  CDAI Exam: CDAI Score: -- Patient Global: --; Provider Global: -- Swollen: --; Tender: -- Joint Exam 03/30/2021   No joint exam has been documented for this visit   There is currently no information documented on the homunculus. Go to the Rheumatology activity and complete the homunculus joint exam.  Investigation: No additional findings.  Imaging: DG Chest Port 1 View  Result Date: 03/03/2021 CLINICAL DATA:  Status post abdominal aortic aneurysm repair. EXAM: PORTABLE CHEST 1 VIEW COMPARISON:  12/03/2020 FINDINGS: Apical lordotic positioning. Numerous leads and wires project over the chest. Midline trachea. Mild cardiomegaly. No pleural effusion or pneumothorax. Clear lungs. No congestive failure. Aortic valve repair. IMPRESSION: No acute cardiopulmonary disease. Cardiomegaly without congestive failure. Electronically Signed   By: Abigail Miyamoto M.D.   On: 03/03/2021 09:45   HYBRID OR IMAGING ([facility] ONLY)  Result Date: 03/03/2021 There is no interpretation for this exam.  This order is for images obtained during a surgical procedure.  Please See "Surgeries" Tab for more information regarding the procedure.   CT ANGIO ABDOMEN PELVIS  W &/OR WO CONTRAST  Result Date: 03/24/2021 CLINICAL DATA:  Abdominal aortic  aneurysm status post Endograft repair. EXAM: CTA ABDOMEN AND PELVIS WITHOUT AND WITH CONTRAST  TECHNIQUE: Multidetector CT imaging of the abdomen and pelvis was performed using the standard protocol during bolus administration of intravenous contrast. Multiplanar reconstructed images and MIPs were obtained and reviewed to evaluate the vascular anatomy. RADIATION DOSE REDUCTION: This exam was performed according to the departmental dose-optimization program which includes automated exposure control, adjustment of the mA and/or kV according to patient size and/or use of iterative reconstruction technique. CONTRAST:  169m OMNIPAQUE IOHEXOL 350 MG/ML SOLN COMPARISON:  12/03/2020 11/24/2020 FINDINGS: VASCULAR Aorta: Interval placement of aorto bi-iliac endograft. Maximum aneurysm size measures 5.1 x 4.5 cm when measured in a similar matter this aneurysm measured 5.3 x 4.3 cm on 11/24/2020. There is flow within the anterior excluded sac likely due to communication from the inferior mesenteric artery. The other flow channel is not definitively identified. Celiac: Mild narrowing of the origin.  Otherwise patent. SMA: No significant stenosis. Common hepatic artery is replaced originating from the SMA. Renals: No significant narrowing of the renal arteries. IMA: Patent and communicates with the excluded sac. Inflow: Moderate stenosis at the origin of the right and mild stenosis at the origin of the left internal iliac arteries. No abnormality of the external iliac arteries. Aorto bi-iliac endograft which extend into the common iliac arteries. No evidence of distal endoleak. No common iliac artery aneurysms. No evidence of endoleak in the common iliac arteries. Interval placement of Proximal Outflow: Mild narrowing of the right common femoral artery due to calcified plaque. Visualized outflow vessel otherwise normal. Veins: No obvious venous abnormality within the limitations of this arterial phase study. Review of the MIP  images confirms the above findings. NON-VASCULAR Lower chest: No acute abnormality. Prosthetic aortic valve partially visualized. Hepatobiliary: No focal liver abnormality is seen. Status post cholecystectomy. No biliary dilatation. Pancreas: Unremarkable. No pancreatic ductal dilatation or surrounding inflammatory changes. Spleen: Normal in size without focal abnormality. Adrenals/Urinary Tract: Adrenal glands are normal. Unchanged atrophy of the lower pole of the left kidney. Kidneys otherwise unremarkable. No significant abnormality of the ureters or bladder. Stomach/Bowel: Small hiatal hernia. No bowel dilatation to indicate ileus or obstruction. Moderate colonic diverticulosis. Normal appendix. Lymphatic: No enlarged abdominal or pelvic lymph nodes. Reproductive: Mild-to-moderate enlargement of the prostate. Other: No abdominal wall hernia or abnormality. No abdominopelvic ascites. Musculoskeletal: Subacute, nondisplaced fractures of the anterior right fifth, lateral right tenth, and posterolateral right eleventh ribs. Interval placement of left total hip prosthesis. Postsurgical changes of the lumbar spine again seen. IMPRESSION: VASCULAR 1. Interval placement of aorto bi-iliac endograft. Overall aneurysm size is not significantly changed since prior exam, measuring 5.1 x 4.5 cm on the current examination compared to 5.3 x 4.3 cm on prior exam. 2. Small type 2 endoleak is present within the anterior sac communicating with least the inferior mesenteric artery. NON-VASCULAR 1. Moderate colonic diverticulosis 2. Multiple subacute, nondisplaced right rib fractures. Electronically Signed   By: FMiachel RouxM.D.   On: 03/24/2021 15:38    Recent Labs: Lab Results  Component Value Date   WBC 8.0 03/04/2021   HGB 8.8 (L) 03/04/2021   PLT 134 (L) 03/04/2021   NA 139 03/04/2021   K 3.9 03/04/2021   CL 101 03/04/2021   CO2 30 03/04/2021   GLUCOSE 133 (H) 03/04/2021   BUN 8 03/04/2021   CREATININE 0.67  03/04/2021   BILITOT 0.6 02/28/2021   ALKPHOS 110 02/28/2021   AST 11 (L) 02/28/2021   ALT 14 02/28/2021   PROT 6.7 02/28/2021   ALBUMIN 3.4 (L) 02/28/2021   CALCIUM  8.6 (L) 03/04/2021   GFRAA 107 08/11/2020   QFTBGOLDPLUS NEGATIVE 01/11/2020    Speciality Comments:  Fosamax started on January 26, 2021.  If he has dysphagia with Fosamax or any GI intolerance we will have to switch him to Reclast.  Procedures:  No procedures performed Allergies: Celebrex [celecoxib] and Tape   Assessment / Plan:     Visit Diagnoses: Polymyalgia rheumatica ([place]) - History of muscle weakness and tenderness, elevated sed rate. Prednisone responsive: He has not had any signs or symptoms of a polymyalgia rheumatica flare.  ESR was 6 on 08/11/2020.  He is currently taking prednisone 9 mg daily which he has been tolerating without any side effects.  The plan is to continue to try to taper prednisone by 1 mg every 2 months.  He is in agreement.  He was advised to notify us if he develops signs or symptoms of a flare as he tries to taper prednisone further.  He will follow-up in the office in 3 months.  High risk medication use -CBC and BMP updated on 03/04/2021.  Future orders for CBC and CMP placed today. Plan: COMPLETE METABOLIC PANEL WITH GFR, CBC with Differential/Platelet  Long term (current) use of systemic steroids - He is currently taking prednisone 9 mg daily.  The plan is to continue to try to taper prednisone by 1 mg every 2 months. He is aware of the risks of long-term prednisone use. Patient was started on Fosamax 70 mg 1 tablet once weekly for management of osteopenia while on long term prednisone.   Primary osteoarthritis of both hands - He has positive ANA but no clinical features of autoimmune disease.  ENA panel was negative.  He has PIP and DIP thickening consistent with osteoarthritis of both hands.  CMC joint thickening noted bilaterally.  He continues to have chronic pain in both hands but has  not noticed any inflammation.  He uses Aspercreme topically as needed for symptomatic relief.  Discussed the use of arthritis compression gloves.  Discussed the importance of joint protection and muscle strengthening.  Status post total hip replacement, left - 12/04/20-  Patient tripped and fell in October.  He acquired left hip fracture and underwent left hip arthroplasty.  Doing well.  He has good range of motion of the left hip replacement with no discomfort on examination.  DDD (degenerative disc disease), lumbar: He has been experiencing increased discomfort in his lower back.  [facility] has [facility] care at [facility] and is planning on having injections in his back in the future.  On examination today he did not have any midline spinal tenderness or SI joint tenderness.  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  He has not had any signs or symptoms of a gout flare.  He remains on allopurinol 200 mg daily.  He is tolerating allopurinol without any side effects.  His uric acid was 5.5 on 09/08/2020.  A future order for uric acid was placed today to be drawn with his lab work next month.  He was advised to notify us if he develops signs or symptoms of a gout flare.- Plan: Uric acid  Osteopenia of multiple sites -  DEXA scan 07/21/20: showed T score -2.2 in the right femoral neck, BMD 0.638.  No comparison available.  Vitamin D was 25 on 01/26/2021.  The patient was prescribed vitamin D 50,000 units once weekly x3 months.  He has 4 doses of vitamin D to take and will return for lab work once completing the prescription. He was started on Fosamax 70 mg 1 tablet once weekly at his last office visit on 01/26/2021 prescribed by Dr. Estanislado Pandy.  He has been tolerating fosamax without any side effects.  He has not missed any doses recently.  He has not had any recent  falls or fractures.    Vitamin D deficiency: Vitamin D was 25 on 01/26/2021.  He is currently taking vitamin D 50,000 units once weekly x3 months.  He has 4 doses left and will be returning to update lab work at that time.  A future order for TB gold remains in place.  Chronic pain syndrome - Followed by pain management-Dr. Lovorn.  He takes oxycodone and tramadol as needed for pain relief.  Psoriasis - Patient developed psoriasis 15 years ago which lasted for 1 year without any recurrence.   No signs of psoriatic arthritis noted.   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   History of diverticulitis  Aneurysm of infrarenal abdominal aorta, unspecified whether ruptured - Followed by Dr. Donzetta Matters. Pre TAVR CT showed large infrarenal abdominal aortic aneurysm which is increasing in size.  He underwent vascular surgery on 03/03/2021 for an endovascular abdominal aortic aneurysm repair performed by Dr. Donzetta Matters.   History of aortic valve replacement - Performed by Dr. Cyndia Bent and Dr. Danise Edge TAVR on 06/07/20.  Essential hypertension  Cancer of tonsillar fossa ([place]) - Treated with surgery and radiation therapy in 2019.  Liver abscess - In 2018 due to diverticulitis.  Treated with IV antibiotics over 3 weeks.  History of hyperlipidemia  Anxiety and depression  History of gastroesophageal reflux (GERD)  Former smoker - one pack per day for 50 years.  Orders: Orders Placed This Encounter  Procedures   COMPLETE METABOLIC PANEL WITH GFR   CBC with Differential/Platelet   Uric acid   No orders of the defined types were placed in this encounter.    Follow-Up Instructions: Return in about 3 months (around 06/27/2021) for Polymyalgia Rheumatica, Osteoarthritis, Gout.   Ofilia Neas, PA-C  Note - This record has been created using Dragon software.  Chart creation errors have been sought, but may not  always  have been located. Such creation errors do not reflect on  the standard of medical care.

## 2021-03-24 NOTE — Telephone Encounter (Signed)
Next Visit: 03/30/2021  Last Visit: 01/26/2021  Last Fill: 01/26/2021  Dx: Polymyalgia rheumatica   Current Dose per office note on 01/26/2021: prednisone 10 mg 1 tablet by mouth daily.  We will taper prednisone by 1 mg every 2 months.  Patient states he has been on 9 mg of Prednisone and has been for a couple of months.   Okay to refill Prednisone?

## 2021-03-27 ENCOUNTER — Other Ambulatory Visit: Payer: Self-pay | Admitting: Physician Assistant

## 2021-03-27 DIAGNOSIS — Z952 Presence of prosthetic heart valve: Secondary | ICD-10-CM

## 2021-03-29 ENCOUNTER — Other Ambulatory Visit: Payer: Self-pay

## 2021-03-29 ENCOUNTER — Ambulatory Visit: Payer: Medicare Other

## 2021-03-29 DIAGNOSIS — L570 Actinic keratosis: Secondary | ICD-10-CM | POA: Diagnosis not present

## 2021-03-29 DIAGNOSIS — L820 Inflamed seborrheic keratosis: Secondary | ICD-10-CM | POA: Diagnosis not present

## 2021-03-29 DIAGNOSIS — I7143 Infrarenal abdominal aortic aneurysm, without rupture: Secondary | ICD-10-CM

## 2021-03-29 DIAGNOSIS — I714 Abdominal aortic aneurysm, without rupture, unspecified: Secondary | ICD-10-CM

## 2021-03-29 DIAGNOSIS — X32XXXD Exposure to sunlight, subsequent encounter: Secondary | ICD-10-CM | POA: Diagnosis not present

## 2021-03-30 ENCOUNTER — Ambulatory Visit (INDEPENDENT_AMBULATORY_CARE_PROVIDER_SITE_OTHER): Payer: Medicare Other | Admitting: Physician Assistant

## 2021-03-30 ENCOUNTER — Other Ambulatory Visit: Payer: Self-pay

## 2021-03-30 ENCOUNTER — Encounter: Payer: Self-pay | Admitting: Physician Assistant

## 2021-03-30 VITALS — BP 160/72 | HR 73 | Ht 67.0 in | Wt 176.8 lb

## 2021-03-30 DIAGNOSIS — C090 Malignant neoplasm of tonsillar fossa: Secondary | ICD-10-CM

## 2021-03-30 DIAGNOSIS — M19041 Primary osteoarthritis, right hand: Secondary | ICD-10-CM

## 2021-03-30 DIAGNOSIS — R1312 Dysphagia, oropharyngeal phase: Secondary | ICD-10-CM

## 2021-03-30 DIAGNOSIS — M353 Polymyalgia rheumatica: Secondary | ICD-10-CM | POA: Diagnosis not present

## 2021-03-30 DIAGNOSIS — Z96642 Presence of left artificial hip joint: Secondary | ICD-10-CM

## 2021-03-30 DIAGNOSIS — Z87891 Personal history of nicotine dependence: Secondary | ICD-10-CM

## 2021-03-30 DIAGNOSIS — Z79899 Other long term (current) drug therapy: Secondary | ICD-10-CM

## 2021-03-30 DIAGNOSIS — Z8719 Personal history of other diseases of the digestive system: Secondary | ICD-10-CM

## 2021-03-30 DIAGNOSIS — M51369 Other intervertebral disc degeneration, lumbar region without mention of lumbar back pain or lower extremity pain: Secondary | ICD-10-CM

## 2021-03-30 DIAGNOSIS — M1A0210 Idiopathic chronic gout, right elbow, without tophus (tophi): Secondary | ICD-10-CM

## 2021-03-30 DIAGNOSIS — I7143 Infrarenal abdominal aortic aneurysm, without rupture: Secondary | ICD-10-CM

## 2021-03-30 DIAGNOSIS — Z7952 Long term (current) use of systemic steroids: Secondary | ICD-10-CM | POA: Diagnosis not present

## 2021-03-30 DIAGNOSIS — Z8639 Personal history of other endocrine, nutritional and metabolic disease: Secondary | ICD-10-CM

## 2021-03-30 DIAGNOSIS — Z952 Presence of prosthetic heart valve: Secondary | ICD-10-CM

## 2021-03-30 DIAGNOSIS — F32A Depression, unspecified: Secondary | ICD-10-CM

## 2021-03-30 DIAGNOSIS — M5136 Other intervertebral disc degeneration, lumbar region: Secondary | ICD-10-CM

## 2021-03-30 DIAGNOSIS — I10 Essential (primary) hypertension: Secondary | ICD-10-CM

## 2021-03-30 DIAGNOSIS — G894 Chronic pain syndrome: Secondary | ICD-10-CM

## 2021-03-30 DIAGNOSIS — L409 Psoriasis, unspecified: Secondary | ICD-10-CM

## 2021-03-30 DIAGNOSIS — F419 Anxiety disorder, unspecified: Secondary | ICD-10-CM

## 2021-03-30 DIAGNOSIS — M19042 Primary osteoarthritis, left hand: Secondary | ICD-10-CM

## 2021-03-30 DIAGNOSIS — K750 Abscess of liver: Secondary | ICD-10-CM

## 2021-03-30 DIAGNOSIS — E559 Vitamin D deficiency, unspecified: Secondary | ICD-10-CM

## 2021-03-30 DIAGNOSIS — M8589 Other specified disorders of bone density and structure, multiple sites: Secondary | ICD-10-CM

## 2021-03-30 NOTE — Progress Notes (Signed)
Brandon [name] presents for follow up of radiation completed on 03/05/2018 to his left tonsil and left neck nodes  Pain issues, if any: Patient denies any mouth or throat pain. Does report lower back pain since his fall/left hip surgery last year (hoping to be able to do cortisone shots to help manage discomfort) Using a feeding tube?: N/A Weight changes, if any:  Wt Readings from Last 3 Encounters:  03/31/21 174 lb (78.9 kg)  03/30/21 176 lb 12.8 oz (80.2 kg)  02/28/21 174 lb 8 oz (79.2 kg)   Swallowing issues, if any: Patient denies. Reports he can eat/drink a wide variety Smoking or chewing tobacco? None Using fluoride trays daily? N/A (full set of dentures) Last ENT visit was on: 10/05/2020 Saw Dr. Francina Ames:  "There is no evidence of disease on exam today.  The area of concern shows a scar band on my exam; he also relates a bout of thrush recently which might have been what was seen on prior exam.  Return in 6 months , certainly sooner if there are any questions or problems. He is agreeable with this plan. All his questions were answered"  Other notable issues, if any: Had an abdominal aortic aneurysm repair last month and left total hip arthroplasty last October. Reports left sinuses are overactive and constantly draining thick phlegm. Has noticed his BP has steadily increased again, and plans to see his cardiologist next month to address medication management. Denies any ear or jaw pain, or difficulty opening his mouth. Overall reports he is in good spirits and doing well all things considered

## 2021-03-31 ENCOUNTER — Encounter: Payer: Self-pay | Admitting: Radiation Oncology

## 2021-03-31 ENCOUNTER — Inpatient Hospital Stay: Payer: Medicare Other

## 2021-03-31 ENCOUNTER — Telehealth: Payer: Self-pay | Admitting: *Deleted

## 2021-03-31 ENCOUNTER — Other Ambulatory Visit: Payer: Self-pay

## 2021-03-31 ENCOUNTER — Ambulatory Visit
Admission: RE | Admit: 2021-03-31 | Discharge: 2021-03-31 | Disposition: A | Discharge status: Home / Self Care | Payer: Medicare Other | Source: Ambulatory Visit | Attending: Radiation Oncology | Admitting: Radiation Oncology

## 2021-03-31 VITALS — BP 150/82 | HR 85 | Temp 96.5°F | Resp 18 | Ht 67.0 in | Wt 174.0 lb

## 2021-03-31 DIAGNOSIS — Z85819 Personal history of malignant neoplasm of unspecified site of lip, oral cavity, and pharynx: Secondary | ICD-10-CM | POA: Insufficient documentation

## 2021-03-31 DIAGNOSIS — Z79899 Other long term (current) drug therapy: Secondary | ICD-10-CM | POA: Insufficient documentation

## 2021-03-31 DIAGNOSIS — M545 Low back pain, unspecified: Secondary | ICD-10-CM | POA: Insufficient documentation

## 2021-03-31 DIAGNOSIS — Z7952 Long term (current) use of systemic steroids: Secondary | ICD-10-CM | POA: Diagnosis not present

## 2021-03-31 DIAGNOSIS — C090 Malignant neoplasm of tonsillar fossa: Secondary | ICD-10-CM

## 2021-03-31 DIAGNOSIS — Z923 Personal history of irradiation: Secondary | ICD-10-CM | POA: Insufficient documentation

## 2021-03-31 DIAGNOSIS — N400 Enlarged prostate without lower urinary tract symptoms: Secondary | ICD-10-CM | POA: Diagnosis not present

## 2021-03-31 DIAGNOSIS — Z7982 Long term (current) use of aspirin: Secondary | ICD-10-CM | POA: Insufficient documentation

## 2021-03-31 DIAGNOSIS — I714 Abdominal aortic aneurysm, without rupture, unspecified: Secondary | ICD-10-CM | POA: Insufficient documentation

## 2021-03-31 DIAGNOSIS — K5730 Diverticulosis of large intestine without perforation or abscess without bleeding: Secondary | ICD-10-CM | POA: Insufficient documentation

## 2021-03-31 DIAGNOSIS — I517 Cardiomegaly: Secondary | ICD-10-CM | POA: Insufficient documentation

## 2021-03-31 DIAGNOSIS — Z85818 Personal history of malignant neoplasm of other sites of lip, oral cavity, and pharynx: Secondary | ICD-10-CM | POA: Diagnosis not present

## 2021-03-31 DIAGNOSIS — Z08 Encounter for follow-up examination after completed treatment for malignant neoplasm: Secondary | ICD-10-CM | POA: Diagnosis not present

## 2021-03-31 LAB — TSH: TSH: 2.348 u[IU]/mL (ref 0.320–4.118)

## 2021-03-31 NOTE — Progress Notes (Signed)
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 presents for follow up of radiation completed on 03/05/2018 to his left tonsil and left neck nodes  Pain issues, if any: Patient denies any mouth or throat pain. Does report lower back pain since his fall/left hip surgery last year (hoping to be able to do cortisone shots to help manage discomfort) Using a feeding tube?: N/A Weight changes, if any:  Wt Readings from Last 3 Encounters:  03/31/21 174 lb (78.9 kg)  03/30/21 176 lb 12.8 oz (80.2 kg)  02/28/21 174 lb 8 oz (79.2 kg)   Swallowing issues, if any: Patient denies. Reports he can eat/drink a wide variety Smoking or chewing tobacco? None Using fluoride trays daily? N/A (full set of dentures) Last ENT visit was on: 10/05/2020 Saw Dr. Francina [name]:  "There is no evidence of disease on exam today.  The area of concern shows a scar band on my exam; he also relates a bout of thrush recently which might have been what was seen on prior exam.  Return in 6 months , certainly sooner if there are any questions or problems. He is agreeable with this plan. All his questions were  answered"  Other notable issues, if any: Had an abdominal aortic aneurysm repair last month and left total hip arthroplasty last October. Reports left sinuses are overactive and constantly draining thick phlegm. Has noticed his BP has steadily increased again, and plans to see his cardiologist next month to address medication management. Denies any ear or jaw pain, or difficulty opening his mouth. Overall reports he is in good spirits and doing well all things considered    ALLERGIES:  is allergic to celebrex [celecoxib] and tape.  Meds: Current Outpatient Medications  Medication Sig Dispense Refill   albuterol (VENTOLIN HFA) 108 (90 Base) MCG/ACT inhaler Inhale 2 puffs into the lungs every 6 (six) hours as needed for wheezing or shortness of breath. 8 g 2   alendronate (FOSAMAX) 70 MG tablet Take 1 tablet (70 mg total) by mouth once a week. Take with a full glass of water on an empty stomach. 12 tablet 0   allopurinol (ZYLOPRIM) 100 MG tablet TAKE 2 TABLETS(200 MG) BY MOUTH DAILY 180 tablet 0   aspirin EC 81 MG tablet Take 81 mg by mouth daily. Swallow whole.     atorvastatin (LIPITOR) 80 MG tablet Take 1 tablet (80 mg total) by mouth daily. 90 tablet 3   carvedilol (COREG) 12.5 MG tablet Take 1 tablet (12.5 mg total) by mouth 2 (two) times daily. 60 tablet 6   citalopram (  CELEXA) 20 MG tablet TAKE 1 TABLET(20 MG) BY MOUTH DAILY 30 tablet 5   clopidogrel (PLAVIX) 75 MG tablet Take 75 mg by mouth daily.     ezetimibe (ZETIA) 10 MG tablet Take 1 tablet (10 mg total) by mouth daily. 30 tablet 11   fluticasone (FLONASE) 50 MCG/ACT nasal spray SHAKE LIQUID AND USE 2 SPRAYS IN EACH NOSTRIL DAILY AS NEEDED FOR RHINITIS OR ALLERGIES 16 g 11   furosemide (LASIX) 20 MG tablet Take 1 tablet (20 mg total) by mouth daily. 90 tablet 3   gabapentin (NEURONTIN) 300 MG capsule TAKE 1 CAPSULE(300 MG) BY MOUTH THREE TIMES DAILY (Patient taking differently: Take 600 mg by mouth 2 (two) times daily.) 90 capsule 5    ipratropium (ATROVENT) 0.03 % nasal spray Place 2 sprays into both nostrils every 12 (twelve) hours. 30 mL 12   lisinopril (ZESTRIL) 20 MG tablet Take 20 mg by mouth daily.     Oxycodone HCl 10 MG TABS Take 1 tablet (10 mg total) by mouth every 6 (six) hours as needed. 120 tablet 0   pantoprazole (PROTONIX) 40 MG tablet Take 1 tablet (40 mg total) by mouth daily. 90 tablet 3   potassium chloride (KLOR-CON) 10 MEQ tablet Take 2 tablets (20 mEq total) by mouth daily. (Patient taking differently: Take 10 mEq by mouth daily.) 180 tablet 3   predniSONE (DELTASONE) 1 MG tablet TAKE 4 TABLETS BY MOUTH DAILY ALONG WITH 5MG  TABLETS FOR A TOTAL OF 9MG  DAILY FOR 2 MONTHS. THEN REDUCE TO 8MG  BY MOUTH DAILY 240 tablet 0   predniSONE (DELTASONE) 5 MG tablet Take 1 tablet (5 mg total) by mouth daily with breakfast. 90 tablet 0   pregabalin (LYRICA) 50 MG capsule Take 1 capsule (50 mg total) by mouth 2 (two) times daily. X 1 week- then 100 mg 2x/day- for nerve pain- to try and improve nerve pain (Patient not taking: Reported on 03/30/2021) 120 capsule 5   tamsulosin (FLOMAX) 0.4 MG CAPS capsule Take 0.4 mg by mouth in the morning and at bedtime.     Tiotropium Bromide Monohydrate (SPIRIVA RESPIMAT) 2.5 MCG/ACT AERS Inhale 2 puffs into the lungs daily. (Patient taking differently: Inhale 2 puffs into the lungs daily as needed (shortness of breath).) 4 g 6   traMADol (ULTRAM) 50 MG tablet Take 2 tablets (100 mg total) by mouth 2 (two) times daily. 120 tablet 5   Vitamin D, Ergocalciferol, (DRISDOL) 1.25 MG (50000 UNIT) CAPS capsule Take 1 capsule (50,000 Units total) by mouth every 7 (seven) days. 12 capsule 0   No current facility-administered medications for this encounter.    Physical Findings:   Wt Readings from Last 3 Encounters:  03/31/21 174 lb (78.9 kg)  03/30/21 176 lb 12.8 oz (80.2 kg)  02/28/21 174 lb 8 oz (79.2 kg)    height is 5\' 7"  (1.702 m) and weight is 174 lb (78.9 kg). His temporal  temperature is 96.5 F (35.8 C) (abnormal). His blood pressure is 150/82 (abnormal) and his pulse is 85. His respiration is 18 and oxygen saturation is 99%. Marland Kitchen   HEENT: No visible tumor in the mouth or oropharynx. No thrush. Dentures removed Neck:  No palpable adenopathy Skin: Intact over neck, warm, dry Chest CTAB Heart RRR  General:  alert and oriented in no acute distress MSK: ambulatory, well nourished Psych: pleasant affect, insight and judgment appear intact  Lab Findings: Lab Results  Component Value Date   WBC 8.0 03/04/2021   HGB  8.8 (L) 03/04/2021   HCT 30.0 (L) 03/04/2021   MCV 79.2 (L) 03/04/2021   PLT 134 (L) 03/04/2021    Lab Results  Component Value Date   TSH 2.348 03/31/2021    Radiographic Findings: DG Chest Port 1 View  Result Date: 03/03/2021 CLINICAL DATA:  Status post abdominal aortic aneurysm repair. EXAM: PORTABLE CHEST 1 VIEW COMPARISON:  12/03/2020 FINDINGS: Apical lordotic positioning. Numerous leads and wires project over the chest. Midline trachea. Mild cardiomegaly. No pleural effusion or pneumothorax. Clear lungs. No congestive failure. Aortic valve repair. IMPRESSION: No acute cardiopulmonary disease. Cardiomegaly without congestive failure. Electronically Signed   By: Abigail Miyamoto M.D.   On: 03/03/2021 09:45   HYBRID OR IMAGING ([facility] ONLY)  Result Date: 03/03/2021 There is no interpretation for this exam.  This order is for images obtained during a surgical procedure.  Please See "Surgeries" Tab for more information regarding the procedure.   CT ANGIO ABDOMEN PELVIS  W &/OR WO CONTRAST  Result Date: 03/24/2021 CLINICAL DATA:  Abdominal aortic aneurysm status post Endograft repair. EXAM: CTA ABDOMEN AND PELVIS WITHOUT AND WITH CONTRAST TECHNIQUE: Multidetector CT imaging of the abdomen and pelvis was performed using the standard protocol during bolus administration of intravenous contrast. Multiplanar reconstructed images and MIPs were obtained and  reviewed to evaluate the vascular anatomy. RADIATION DOSE REDUCTION: This exam was performed according to the departmental dose-optimization program which includes automated exposure control, adjustment of the mA and/or kV according to patient size and/or use of iterative reconstruction technique. CONTRAST:  138mL OMNIPAQUE IOHEXOL 350 MG/ML SOLN COMPARISON:  12/03/2020 11/24/2020 FINDINGS: VASCULAR Aorta: Interval placement of aorto bi-iliac endograft. Maximum aneurysm size measures 5.1 x 4.5 cm when measured in a similar matter this aneurysm measured 5.3 x 4.3 cm on 11/24/2020. There is flow within the anterior excluded sac likely due to communication from the inferior mesenteric artery. The other flow channel is not definitively identified. Celiac: Mild narrowing of the origin.  Otherwise patent. SMA: No significant stenosis. Common hepatic artery is replaced originating from the SMA. Renals: No significant narrowing of the renal arteries. IMA: Patent and communicates with the excluded sac. Inflow: Moderate stenosis at the origin of the right and mild stenosis at the origin of the left internal iliac arteries. No abnormality of the external iliac arteries. Aorto bi-iliac endograft which extend into the common iliac arteries. No evidence of distal endoleak. No common iliac artery aneurysms. No evidence of endoleak in the common iliac arteries. Interval placement of Proximal Outflow: Mild narrowing of the right common femoral artery due to calcified plaque. Visualized outflow vessel otherwise normal. Veins: No obvious venous abnormality within the limitations of this arterial phase study. Review of the MIP images confirms the above findings. NON-VASCULAR Lower chest: No acute abnormality. Prosthetic aortic valve partially visualized. Hepatobiliary: No focal liver abnormality is seen. Status post cholecystectomy. No biliary dilatation. Pancreas: Unremarkable. No pancreatic ductal dilatation or surrounding  inflammatory changes. Spleen: Normal in size without focal abnormality. Adrenals/Urinary Tract: Adrenal glands are normal. Unchanged atrophy of the lower pole of the left kidney. Kidneys otherwise unremarkable. No significant abnormality of the ureters or bladder. Stomach/Bowel: Small hiatal hernia. No bowel dilatation to indicate ileus or obstruction. Moderate colonic diverticulosis. Normal appendix. Lymphatic: No enlarged abdominal or pelvic lymph nodes. Reproductive: Mild-to-moderate enlargement of the prostate. Other: No abdominal wall hernia or abnormality. No abdominopelvic ascites. Musculoskeletal: Subacute, nondisplaced fractures of the anterior right fifth, lateral right tenth, and posterolateral right eleventh ribs. Interval placement of  left total hip prosthesis. Postsurgical changes of the lumbar spine again seen. IMPRESSION: VASCULAR 1. Interval placement of aorto bi-iliac endograft. Overall aneurysm size is not significantly changed since prior exam, measuring 5.1 x 4.5 cm on the current examination compared to 5.3 x 4.3 cm on prior exam. 2. Small type 2 endoleak is present within the anterior sac communicating with least the inferior mesenteric artery. NON-VASCULAR 1. Moderate colonic diverticulosis 2. Multiple subacute, nondisplaced right rib fractures. Electronically Signed   By: Miachel Roux M.D.   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   For future visits, the plan is for the patient to return to the cancer center to f/u with H+N survivorship in 6 mo and with me PRN. He is pleased with this plan.  On date of service, in total, I spent 25 minutes on this  encounter.  He was seen in person. ____________________________   Brandon Gibson, MD

## 2021-03-31 NOTE — Telephone Encounter (Signed)
CALLED PATIENT TO ASK ABOUT COMING FOR LABS TODAY @ 9:45 AM PRIOR TO FU, SPOKE WITH PATIENT AND HE AGREED TO DO SO

## 2021-04-04 DIAGNOSIS — Z87891 Personal history of nicotine dependence: Secondary | ICD-10-CM | POA: Diagnosis not present

## 2021-04-04 DIAGNOSIS — C108 Malignant neoplasm of overlapping sites of oropharynx: Secondary | ICD-10-CM | POA: Diagnosis not present

## 2021-04-04 DIAGNOSIS — Z85810 Personal history of malignant neoplasm of tongue: Secondary | ICD-10-CM | POA: Diagnosis not present

## 2021-04-04 DIAGNOSIS — Z9889 Other specified postprocedural states: Secondary | ICD-10-CM | POA: Diagnosis not present

## 2021-04-04 DIAGNOSIS — Z08 Encounter for follow-up examination after completed treatment for malignant neoplasm: Secondary | ICD-10-CM | POA: Diagnosis not present

## 2021-04-05 ENCOUNTER — Ambulatory Visit (INDEPENDENT_AMBULATORY_CARE_PROVIDER_SITE_OTHER): Payer: Medicare Other | Admitting: Vascular Surgery

## 2021-04-05 ENCOUNTER — Other Ambulatory Visit: Payer: Self-pay

## 2021-04-05 ENCOUNTER — Encounter: Payer: Self-pay | Admitting: Vascular Surgery

## 2021-04-05 VITALS — BP 130/54 | HR 80 | Temp 98.1°F | Resp 20 | Ht 67.0 in | Wt 178.0 lb

## 2021-04-05 DIAGNOSIS — I7143 Infrarenal abdominal aortic aneurysm, without rupture: Secondary | ICD-10-CM

## 2021-04-05 NOTE — H&P (View-Only) (Signed)
?  ? ?  Subjective:  ?  ? Patient ID: Brandon [name], Brandon [name]   DOB: 1940-10-12, 81 y.o.   MRN: [id number] ? ?HPI 81 year old Brandon [name] recently underwent endovascular aneurysm repair for 5.5 cm abdominal aortic aneurysm.  Since that time he has done very well other than some pain in his left hip where he had previous replacement.  He follows up today with CTA to evaluate recent repair.  He has no new back or abdominal pain. ? ? ?Review of Systems ?Left hip pain ?   ?Objective:  ? Physical Exam ?Vitals:  ? 04/05/21 1336  ?BP: (!) 130/54  ?Pulse: 80  ?Resp: 20  ?Temp: 98.1 ?F (36.7 ?C)  ?SpO2: 95%  ? ? ?Aaox3 ?Non labored respirations ?Abdomen is soft ?Left common femoral artery not palable, right is palpable. ? ? ? ?CTA IMPRESSION: ?VASCULAR ?  ?1. Interval placement of aorto bi-iliac endograft. Overall aneurysm ?size is not significantly changed since prior exam, measuring 5.1 x ?4.5 cm on the current examination compared to 5.3 x 4.3 cm on prior ?exam. ?2. 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 ?  ?NON-VASCULAR ?  ?1. Moderate colonic diverticulosis ?2. Multiple subacute, nondisplaced right rib fractures. ?   ?Assessment:  ?   ?81 year old Brandon [name] with recent endovascular aneurysm repair with EVAR with what appears to be a crushed left limb.  We evaluated this with CT scan today in our office and he demonstrates good understanding. ?   ?Plan:  ?   ?Aortogram from bilateral common femoral approach will likely need IVUS to planned stenting with balloon expandable noncovered stents. ?   ? ?Jette Lewan C. Donzetta Matters, MD ?[facility] ?Office: [phone] ?Pager: [phone] ? ?

## 2021-04-05 NOTE — Progress Notes (Signed)
?  ? ?  Subjective:  ?  ? Patient ID: Brandon [name], male   DOB: February 24, 1940, 81 y.o.   MRN: [id number] ? ?HPI 81 year old male recently underwent endovascular aneurysm repair for 5.5 cm abdominal aortic aneurysm.  Since that time he has done very well other than some pain in his left hip where he had previous replacement.  He follows up today with CTA to evaluate recent repair.  He has no new back or abdominal pain. ? ? ?Review of Systems ?Left hip pain ?   ?Objective:  ? Physical Exam ?Vitals:  ? 04/05/21 1336  ?BP: (!) 130/54  ?Pulse: 80  ?Resp: 20  ?Temp: 98.1 ?F (36.7 ?C)  ?SpO2: 95%  ? ? ?Aaox3 ?Non labored respirations ?Abdomen is soft ?Left common femoral artery not palable, right is palpable. ? ? ? ?CTA IMPRESSION: ?VASCULAR ?  ?1. Interval placement of aorto bi-iliac endograft. Overall aneurysm ?size is not significantly changed since prior exam, measuring 5.1 x ?4.5 cm on the current examination compared to 5.3 x 4.3 cm on prior ?exam. ?2. 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 ?  ?NON-VASCULAR ?  ?1. Moderate colonic diverticulosis ?2. Multiple subacute, nondisplaced right rib fractures. ?   ?Assessment:  ?   ?81 year old male with recent endovascular aneurysm repair with EVAR with what appears to be a crushed left limb.  We evaluated this with CT scan today in our office and he demonstrates good understanding. ?   ?Plan:  ?   ?Aortogram from bilateral common femoral approach will likely need IVUS to planned stenting with balloon expandable noncovered stents. ?   ? ?Mackenzy Eisenberg C. Donzetta Matters, MD ?[facility] ?Office: [phone] ?Pager: [phone] ? ?

## 2021-04-06 ENCOUNTER — Other Ambulatory Visit: Payer: Self-pay

## 2021-04-10 ENCOUNTER — Other Ambulatory Visit: Payer: Self-pay

## 2021-04-10 ENCOUNTER — Ambulatory Visit (HOSPITAL_COMMUNITY)
Admission: RE | Admit: 2021-04-10 | Discharge: 2021-04-10 | Disposition: A | Discharge status: Home / Self Care | Payer: Medicare Other | Attending: Vascular Surgery | Admitting: Vascular Surgery

## 2021-04-10 ENCOUNTER — Encounter (HOSPITAL_COMMUNITY)
Admission: RE | Disposition: A | Discharge status: Home / Self Care | Payer: Self-pay | Source: Home / Self Care | Attending: Vascular Surgery

## 2021-04-10 DIAGNOSIS — I714 Abdominal aortic aneurysm, without rupture, unspecified: Secondary | ICD-10-CM | POA: Diagnosis not present

## 2021-04-10 DIAGNOSIS — M25552 Pain in left hip: Secondary | ICD-10-CM | POA: Insufficient documentation

## 2021-04-10 DIAGNOSIS — I723 Aneurysm of iliac artery: Secondary | ICD-10-CM | POA: Diagnosis not present

## 2021-04-10 HISTORY — PX: ABDOMINAL AORTOGRAM W/LOWER EXTREMITY: CATH118223

## 2021-04-10 LAB — POCT I-STAT, CHEM 8
BUN: 12 mg/dL (ref 8–23)
Calcium, Ion: 1.2 mmol/L (ref 1.15–1.40)
Chloride: 97 mmol/L — ABNORMAL LOW (ref 98–111)
Creatinine, Ser: 0.5 mg/dL — ABNORMAL LOW (ref 0.61–1.24)
Glucose, Bld: 111 mg/dL — ABNORMAL HIGH (ref 70–99)
HCT: 32 % — ABNORMAL LOW (ref 39.0–52.0)
Hemoglobin: 10.9 g/dL — ABNORMAL LOW (ref 13.0–17.0)
Potassium: 3.5 mmol/L (ref 3.5–5.1)
Sodium: 139 mmol/L (ref 135–145)
TCO2: 30 mmol/L (ref 22–32)

## 2021-04-10 SURGERY — ABDOMINAL AORTOGRAM W/LOWER EXTREMITY
Anesthesia: LOCAL

## 2021-04-10 MED ORDER — HYDRALAZINE HCL 20 MG/ML IJ SOLN: 5.0000 mg | INTRAMUSCULAR | Status: DC | PRN

## 2021-04-10 MED ORDER — HEPARIN SODIUM (PORCINE) 1000 UNIT/ML IJ SOLN
INTRAMUSCULAR | Status: AC
  Filled 2021-04-10: qty 10

## 2021-04-10 MED ORDER — IODIXANOL 320 MG/ML IV SOLN
INTRAVENOUS | Status: DC | PRN
  Administered 2021-04-10: 50 mL via INTRA_ARTERIAL

## 2021-04-10 MED ORDER — SODIUM CHLORIDE 0.9% FLUSH: 3.0000 mL | Freq: Two times a day (BID) | INTRAVENOUS | Status: DC

## 2021-04-10 MED ORDER — FENTANYL CITRATE (PF) 100 MCG/2ML IJ SOLN
INTRAMUSCULAR | Status: AC
Start: 2021-04-10 — End: ?
  Filled 2021-04-10: qty 2

## 2021-04-10 MED ORDER — MIDAZOLAM HCL 2 MG/2ML IJ SOLN
INTRAMUSCULAR | Status: DC | PRN
  Administered 2021-04-10: 2 mg via INTRAVENOUS

## 2021-04-10 MED ORDER — MORPHINE SULFATE (PF) 2 MG/ML IV SOLN: 2.0000 mg | INTRAVENOUS | Status: DC | PRN

## 2021-04-10 MED ORDER — SODIUM CHLORIDE 0.9 % IV SOLN: INTRAVENOUS | Status: DC

## 2021-04-10 MED ORDER — MIDAZOLAM HCL 2 MG/2ML IJ SOLN
INTRAMUSCULAR | Status: AC
  Filled 2021-04-10: qty 2

## 2021-04-10 MED ORDER — LIDOCAINE HCL (PF) 1 % IJ SOLN
INTRAMUSCULAR | Status: AC
  Filled 2021-04-10: qty 30

## 2021-04-10 MED ORDER — HEPARIN (PORCINE) IN NACL 1000-0.9 UT/500ML-% IV SOLN
INTRAVENOUS | Status: AC
  Filled 2021-04-10: qty 500

## 2021-04-10 MED ORDER — OXYCODONE HCL 5 MG PO TABS
5.0000 mg | ORAL_TABLET | ORAL | Status: DC | PRN
  Administered 2021-04-10: 10 mg via ORAL
  Filled 2021-04-10: qty 2

## 2021-04-10 MED ORDER — ONDANSETRON HCL 4 MG/2ML IJ SOLN
4.0000 mg | Freq: Four times a day (QID) | INTRAMUSCULAR | Status: DC | PRN
Start: 2021-04-10 — End: 2021-04-10

## 2021-04-10 MED ORDER — SODIUM CHLORIDE 0.9% FLUSH: 3.0000 mL | INTRAVENOUS | Status: DC | PRN

## 2021-04-10 MED ORDER — HEPARIN (PORCINE) IN NACL 1000-0.9 UT/500ML-% IV SOLN
INTRAVENOUS | Status: DC | PRN
  Administered 2021-04-10 (×2): 500 mL

## 2021-04-10 MED ORDER — ACETAMINOPHEN 325 MG PO TABS: 650.0000 mg | ORAL_TABLET | ORAL | Status: DC | PRN

## 2021-04-10 MED ORDER — LIDOCAINE HCL (PF) 1 % IJ SOLN
INTRAMUSCULAR | Status: DC | PRN
  Administered 2021-04-10: 19 mL via INTRADERMAL

## 2021-04-10 MED ORDER — HEPARIN SODIUM (PORCINE) 1000 UNIT/ML IJ SOLN
INTRAMUSCULAR | Status: DC | PRN
  Administered 2021-04-10: 5000 [IU] via INTRAVENOUS

## 2021-04-10 MED ORDER — SODIUM CHLORIDE 0.9 % IV SOLN: 250.0000 mL | INTRAVENOUS | Status: DC | PRN

## 2021-04-10 MED ORDER — LABETALOL HCL 5 MG/ML IV SOLN: 10.0000 mg | INTRAVENOUS | Status: DC | PRN

## 2021-04-10 MED ORDER — FENTANYL CITRATE (PF) 100 MCG/2ML IJ SOLN
INTRAMUSCULAR | Status: DC | PRN
  Administered 2021-04-10: 50 ug via INTRAVENOUS
  Administered 2021-04-10: 25 ug via INTRAVENOUS
  Administered 2021-04-10: 50 ug via INTRAVENOUS

## 2021-04-10 SURGICAL SUPPLY — 18 items
BALLN MUSTANG 12X20X75 (BALLOONS) ×4
BALLOON MUSTANG 12X20X75 (BALLOONS) IMPLANT
CATH OMNI FLUSH 5F 65CM (CATHETERS) ×1 IMPLANT
CATH OPTICROSS 18 (CATHETERS) ×1 IMPLANT
CLOSURE MYNX CONTROL 6F/7F (Vascular Products) ×2 IMPLANT
KIT ENCORE 26 ADVANTAGE (KITS) ×1 IMPLANT
KIT MICROPUNCTURE NIT STIFF (SHEATH) ×1 IMPLANT
KIT PV (KITS) ×2 IMPLANT
SHEATH BRITE TIP 7FR 35CM (SHEATH) ×1 IMPLANT
SHEATH PINNACLE 5F 10CM (SHEATH) ×1 IMPLANT
SHEATH PINNACLE 7F 10CM (SHEATH) ×2 IMPLANT
STENT EXPRESS LD 10X37X75 (Permanent Stent) ×1 IMPLANT
STENT OMNILINK ELITE 10X29X80 (Permanent Stent) ×1 IMPLANT
SYR MEDRAD MARK V 150ML (SYRINGE) ×1 IMPLANT
TRANSDUCER W/STOPCOCK (MISCELLANEOUS) ×2 IMPLANT
TRAY PV CATH (CUSTOM PROCEDURE TRAY) ×2 IMPLANT
WIRE BENTSON .035X145CM (WIRE) ×1 IMPLANT
WIRE G V18X300CM (WIRE) ×1 IMPLANT

## 2021-04-10 NOTE — Op Note (Signed)
? ? ?  Patient name: Brandon [name] MRN: [id number] DOB: September 24, 1940 Sex: male ? 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 Donzetta Matters, MD ?Procedure Performed: ?1.  Ultrasound-guided cannulation bilateral common femoral arteries ?2.  Mynx device closure bilateral common femoral arteries ?3.  Stent of left iliac limb with express LD iliac 10 x 37 mm postdilated with 12 mm balloon ?4.  Stent of right iliac limb with Omnilink Elite 10 x 29 mm postdilated with 12 mm balloon ?5.  Aortogram ?6.  Intravascular ultrasound of bilateral common iliac limbs ?7.  Moderate sedation with fentanyl and Versed for 46 minutes ? ? ?Indications: 81 year old male recently underwent endovascular aneurysm repair.  On follow-up CT scan patient was noted to have high-grade stenosis of his left iliac limb.  He is now indicated for aortogram possible intervention. ? ?Findings: By initial aortogram there appeared to be haziness in the left iliac limb.  We deployed a left iliac limb stent with IVUS on the right and this demonstrated impingement on the right side.  Initially the diameter of the left was 2 mm and was dilated to 9 mm at completion.  On the right side the limb was 12 mm after we impinged on it it was down to 6 mm and after stenting was back up to 10 mm.  Minx devices were placed in both groins. ?  ?Procedure:  The patient was identified in the holding area and taken to room 8.  The patient was then placed supine on the table and prepped and draped in the usual sterile fashion.  A time out was called.  Ultrasound was used to evaluate the left common femoral artery.  There is anesthetized 1% lidocaine cannulated with micropuncture needle followed by wire and sheath.  Images saved per record.  We placed a Bentson wire followed by a 5 [place] sheath.  We placed an 018 wire into the aorta and performed intravascular ultrasound we  identified the tightly stenosed limb.  At this time the patient was given 5000 units of heparin.  We exchanged for a long 7 French sheath and plan for stenting.  At same time we used ultrasound to identify the right common femoral artery this was cannulated with micropuncture needle followed by wire and sheath and we placed a Bentson wire.  Images saved the permanent record.  We placed a 018 wire and used intravascular ultrasound and during this time we deployed the left common iliac limb stent.  This did appear to impinge on the right side.  We performed IVUS which demonstrated a 6 mm lumen on the right.  We then exchanged for 7 French sheath on the right.  We placed the IVUS through the left side where the limb now measured approximately 10 mm.  We then deployed a stent on the right side and again was a 10 mm stent.  This impinges on the left.  We then balloon dilated both to 12 mm simultaneously.  Completion demonstrated in the lumen on the right side of 10 mm and on the left side of 9 mm.  Satisfied with this we exchanged for short 7 French sheath on the left we already had a short 7 [place] sheath on the right.  We deployed these devices.  He tolerated procedure without any complication. ? ? ?Contrast: 50cc ? ?Sharry Beining C. Donzetta Matters, MD ?[facility] ?Office: [phone] ?Pager: [phone] ? ? ?

## 2021-04-10 NOTE — Interval H&P Note (Signed)
History and Physical Interval Note: ? ?04/10/2021 ?8:05 AM ? ?Brandon [name]  has presented today for surgery, with the diagnosis of triple A.  The various methods of treatment have been discussed with the patient and family. After consideration of risks, benefits and other options for treatment, the patient has consented to  Procedure(s): ?ABDOMINAL AORTOGRAM W/LOWER EXTREMITY (N/A) as a surgical intervention.  The patient's history has been reviewed, patient examined, no change in status, stable for surgery.  I have reviewed the patient's chart and labs.  Questions were answered to the patient's satisfaction.   ? ? ?Servando Snare ? ? ?

## 2021-04-11 ENCOUNTER — Encounter (HOSPITAL_COMMUNITY): Payer: Self-pay | Admitting: Vascular Surgery

## 2021-04-12 ENCOUNTER — Ambulatory Visit: Payer: Medicare Other

## 2021-04-19 ENCOUNTER — Telehealth: Payer: Self-pay | Admitting: *Deleted

## 2021-04-19 MED ORDER — OXYCODONE HCL 10 MG PO TABS: 10.0000 mg | ORAL_TABLET | Freq: Four times a day (QID) | ORAL | 0 refills | Status: DC | PRN

## 2021-04-19 NOTE — Telephone Encounter (Signed)
Notified. 

## 2021-04-19 NOTE — Telephone Encounter (Signed)
Calling for a refill on his oxycodone. Per PMP last filled 03/17/21 and next appt is 06/02/21. ?

## 2021-04-20 ENCOUNTER — Other Ambulatory Visit: Payer: Self-pay

## 2021-04-24 ENCOUNTER — Encounter: Payer: Self-pay | Admitting: Family Medicine

## 2021-04-24 ENCOUNTER — Ambulatory Visit (INDEPENDENT_AMBULATORY_CARE_PROVIDER_SITE_OTHER): Payer: Medicare Other | Admitting: Family Medicine

## 2021-04-24 ENCOUNTER — Other Ambulatory Visit: Payer: Self-pay

## 2021-04-24 VITALS — BP 118/70 | HR 75 | Temp 97.9°F | Ht 66.5 in | Wt 180.4 lb

## 2021-04-24 DIAGNOSIS — I2489 Other forms of acute ischemic heart disease: Secondary | ICD-10-CM

## 2021-04-24 DIAGNOSIS — Z9889 Other specified postprocedural states: Secondary | ICD-10-CM | POA: Diagnosis not present

## 2021-04-24 DIAGNOSIS — Z630 Problems in relationship with spouse or partner: Secondary | ICD-10-CM

## 2021-04-24 DIAGNOSIS — I5032 Chronic diastolic (congestive) heart failure: Secondary | ICD-10-CM

## 2021-04-24 DIAGNOSIS — J309 Allergic rhinitis, unspecified: Secondary | ICD-10-CM

## 2021-04-24 DIAGNOSIS — I248 Other forms of acute ischemic heart disease: Secondary | ICD-10-CM | POA: Diagnosis not present

## 2021-04-24 DIAGNOSIS — J449 Chronic obstructive pulmonary disease, unspecified: Secondary | ICD-10-CM

## 2021-04-24 DIAGNOSIS — Z8679 Personal history of other diseases of the circulatory system: Secondary | ICD-10-CM

## 2021-04-24 DIAGNOSIS — M353 Polymyalgia rheumatica: Secondary | ICD-10-CM

## 2021-04-24 DIAGNOSIS — I10 Essential (primary) hypertension: Secondary | ICD-10-CM | POA: Diagnosis not present

## 2021-04-24 DIAGNOSIS — E782 Mixed hyperlipidemia: Secondary | ICD-10-CM

## 2021-04-24 DIAGNOSIS — Z8781 Personal history of (healed) traumatic fracture: Secondary | ICD-10-CM | POA: Insufficient documentation

## 2021-04-24 NOTE — Progress Notes (Signed)
Patient ID: Brandon [name], male   DOB: 09/26/1940, 81 y.o.   MRN: [id number] ?PCP: Elyn Aquas ?Cardiology: Dr. Aundra Dubin ? ?81 y.o.with history of CAD, HTN, and hyperlipidemia presents for followup.  Patient had described some exertional chest pain and myoview showed a partially reversible inferior defect, so LHC was done in 7/10.  This showed that his proximal RCA was totally occluded.  There were very robust left to right collaterals.  There was also a 50% proximal LAD stenosis.  The patient was managed medically.   ? ?Most recent echo in 5/20 showed EF 65-70% with moderate AS and mild AI.    ? ?Earlier in 2018, he developed a Strep intermedius liver abscess.  He had a hepatic drain placed and also had a right pleural effusion requiring diuresis.   ? 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  He is now cancer-free.   ? ?Abdominal US in 10/20 showed AAA up to 4.8 cm. Abdominal US in 2/22 showed stable 4.8 cm AAA.  ? ?Echo in 2/22 showed EF 60-65% with mild LVH, normal RV, PASP 45 mmHg, severe AS with mean gradient 50 mmHg and AVA 0.96 cm^2. RHC/LHC was done in preparation for AVR, this showed 95% pLAD stenosis, 60-70% pLCx, and CTO RCA with collaterals.  FFR was negative for LCx, patient had DES to LAD.  ? ?Patient had TAVR in 5/22 with 29 mm Edwards Sapien THV.  Echo in 7/22 showed hyperdynamic LV with EF 70-75%, normal RV, bioprosthetic aortic valve with mild AI and higher mean gradient 16 mmHg.  Echo in 10/22 showed EF 60-65%, moderate asymmetric basal septal hypertrophy, normal RV, normal IVC, bioprosthetic AoV s/p TAVR with mean gradient 16 and trivial PVL.  ? ?In 10/22, patient had mechanical fall (tripped) and fractured his left femoral neck.  He had left THR in 10/22.    ? ?He underwent an abdominal aorta endovascular stent graft on 03/03/2021. He notes that it was discovered afterwards that he did not have good flow through the left iliac. 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  ? ?Today he returns for CAD and aortic stenosis follow up. Overall feeling fine. Recovering well form AAA surgery. Having more back pain and going to see Ortho soon. No significant dyspnea walking on flat ground or lifting laundry. Denies abnormal bleeding, palpitations, CP, dizziness, edema, or PND/Orthopnea. Appetite ok. No fever or chills. Weight at home 176-178 pounds. Taking all medications. BP well-controlled at home. ? ?ECG (personally reviewed): NSR with PAC ? ?Labs (12/10): HDL 28, LDL 70, K 4.3, creatinine 0.8 ?Labs (4/11): K 4, creatinine 0.8, LDL 53, HDL 26, LFTs normal ?Labs (12/11): LDL 74, HDL 27, K 4, creatinine 0.9, LFTs normal, TSH normal ?Labs (7/12): LDL 87, HDL 33 ?Labs (9/12): LDL 66, HDL 33, LFTs normal ?Labs (9/13): LDL 62, HDL 30, K 4.1, creatinine 0.8 ?Labs (4/14): LDL 88, HDL 27, K 4.4, creatinine 0.8 ?Labs (4/15): K 4.4, creatinine 0.76 ?Labs (4/16): LDL 69, HDL 32 ?Labs (11/16): K 4.3, creatinine 0.76, LDL 68, HDL 31 ?Labs (1/18): LDL 72 ?Labs (6/18): K 4.2, creatinine 0.6, LFTs normal, hgb 8.1.  ?Labs (9/18): K 4, creatinine 0.6, LDL 79, HDL 36 ?Labs (1/19): K 3.9, creatinine 0.67 ?Labs (8/21): K 3.7, creatinine 0.78 ?Labs (2/22): LDL 98 ?Labs (3/22): K 3.2, creatinine 0.65 ?Labs (6/22): LDL 77 ?Labs (7/22): K 3.7,creatinine 0.68 ?Labs (8/22): K 3.6, creatinine  0.77, hgb 10.7 ?Labs (11/22): K 3.7, creatinine 0.6, hgb 8.3 ?Labs (1/23): K 3.9, creatinine 0.67 ? ?Allergies (verified):  ?1)  ! Celebrex ? ?Past Medical History: ?1. Hyperlipidemia: Myalgias with atorvastatin and Crestor. ?2. Aortic stenosis.  Echocardiogram in August 2009 showed EF 60%, no regional wall motion abnormalities, mild LVH, moderate focal basal septal hypertrophy, and mild diastolic dysfunction.  There was a very mild aortic stenosis with partially fused left and right coronary cusps and some restricted motion of the aortic valve.   The mean gradient, however, across the aortic valve was only 8 mmHg.  There was also mild left atrial enlargement and normal RV size and function.  Minimal AS on LHC in 7/10.  Echo (7/14) with EF 60-65%, mild LVH, mild AS mean gradient 13 mmHg.  Echo (6/16) with EF 55-60%, mild AS with mean gradient 14 mmHg.  ?- Echo (6/18): EF 55-60%, mild LVH, moderate AS mean gradient 27 mmHg, mild AI.   ?- Echo (5/20, [facility]): EF 65-70%, moderate AS, mild AI.  ?- Echo (2/22): EF 60-65% with mild LVH, normal RV, PASP 45 mmHg, severe AS with mean gradient 50 mmHg and AVA 0.96 cm^2.  ?- TAVR with 29 mm Edwards Sapien THV in 5/22.  ?- Echo (7/22): Hyperdynamic LV with EF 70-75%, normal RV, bioprosthetic aortic valve with mild AI and higher mean gradient 16 mmHg. ?- Echo (10/22): EF 60-65%, moderate asymmetric basal septal hypertrophy, normal RV, normal IVC, bioprosthetic AoV s/p TAVR with mean gradient 16 and trivial PVL. ?3. Hypertension. ?4. Gastroesophageal reflux disease: h/o esophageal stricture.  ?5. BPH. ?6. Diverticulosis. ?7. Smoked until 7/10. ?8. CAD: LHC (7/10) showed totally occluded proximal RCA with very robust left to right collaterals, 50% proximal LAD stenosis, EF 65%.   ?- LHC (2/22): 95% stenosis proximal LAD, 60-70% proximal LCx, totally occluded RCA with collaterals.  Negative FFR of LCx, patient had DES to LAD.   ?9. Osteoarthritis ?10. Colonic polyps ?11. h/o TIA ?12. 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  This manifested with slurred speech. ?13. History of low back pain.  The patient is status post surgical back fusion. ?14. History of cholecystectomy.  ?15. Cataract surgery ?16. Prostate cancer: treated ?17. Liver abscess: Strep intermedius, 2018.  ?18. AAA: Noted in 8/18 on CT abdomen, 4.3 cm.  ?- Abdominal US (10/20): 4.8 cm AAA.  ?- Abdominal US (2/22): 4.8 cm AAA ?- CT abdomen 3/22 with 5 cm AAA ?- CT abdomen (10/22): 5.2 cm AAA ?- s/p AAA repair 1/23 ?19. Carotid stenosis: carotid  dopplers 10/18 with 40-59% RICA stenosis.  ?- Carotid dopplers (10/20): 50-69% RICA stenosis.   ?- Carotid dopplers (2/22): Mild BICA stenosis.  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  ?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  ?22. Gout ?23. Mechanical fall with left femoral neck fracture and left THR in 10/22.  ? ?Family History: ?Father alive w/ CAD & CABG at [facility] in 2009, had TAVR as well.  ?Mother died age 95 w/ Alzheimer's disease, hx of DJD ?1 Sibling- Brother alive & well w/ DJD in his hips ? ?Social History: ?Married- 3rd marriage, wife= [name] ?2 children from 1st marriage, 4 step children ?Former smoker, 40+yrs of >1ppd, quit 7/10. Restarted, then quit again in 4/22.  ?social alcohol ?retired on disability due to back ?former Futures trader now doing contracting work; Radiographer, therapeutic ?restores antique furniture for hobby ? ?ROS: All systems reviewed and negative except as per HPI.  ? ?Current Outpatient  Medications  ?Medication Sig Dispense Refill  ? acetaminophen (TYLENOL) 500 MG tablet Take by mouth.    ? albuterol (VENTOLIN HFA) 108 (90 Base) MCG/ACT inhaler Inhale 2 puffs into the lungs every 6 (six) hours as needed for wheezing or shortness of breath. 8 g 2  ? alendronate (FOSAMAX) 70 MG tablet Take 1 tablet (70 mg total) by mouth once a week. Take with a full glass of water on an empty stomach. (Patient taking differently: Take 70 mg by mouth every Tuesday. Take with a full glass of water on an empty stomach.) 12 tablet 0  ? allopurinol (ZYLOPRIM) 100 MG tablet TAKE 2 TABLETS(200 MG) BY MOUTH DAILY 180 tablet 0  ? amLODipine (NORVASC) 10 MG tablet Take 10 mg by mouth daily.    ? aspirin EC 81 MG tablet Take 81 mg by mouth daily. Swallow whole.    ? atorvastatin (LIPITOR) 80 MG tablet Take 1 tablet (80 mg total) by mouth daily. 90 tablet 3  ? carvedilol (COREG) 12.5 MG tablet Take 1 tablet (12.5 mg total) by mouth 2 (two) times daily. 60 tablet 6  ? Cholecalciferol  25 MCG (1000 UT) tablet Take by mouth.    ? citalopram (CELEXA) 20 MG tablet TAKE 1 TABLET(20 MG) BY MOUTH DAILY 30 tablet 5  ? clopidogrel (PLAVIX) 75 MG tablet Take 75 mg by mouth daily.    ? ezetimibe (ZETIA) 10 MG ta

## 2021-04-24 NOTE — Progress Notes (Signed)
?[facility] ?[facility] ?[facility] ?[place] [address] ?Dept: [phone] ?Dept Fax: [phone] ? ?Chronic Care Office Visit ? ?Subjective:  ? ? Patient ID: Brandon [name], male    DOB: 02/17/1940, 81 y.o..   MRN: [id number] ? ?Chief Complaint  ?Patient presents with  ? Follow-up  ?  3 month f/u.  No concerns. Not fasting today.   ? ? ?History of Present Illness: ? ?Patient is in today for reassessment of chronic medical issues. ? ?[name] has a history of an abdominal aortic aneurysm. He underwent an abdominal aorta endovascular stent graft on 03/03/2021. He notes that it was discovered afterwards that he did not have good flow through the left iliac. He had a repeat catheterization on 3/6 with endovascular aneurysm repair of the left iliac artery. He feels he is recovering well from all of this. ? ?[name] has a history of coronary artery disease. He is managed on aspirin, clopidogrel, atorvastatin, ezetimibe, and carvedilol. He also has chronic diastolic heart failure. he is also on Lasix and lisinopril for this.  ? ?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 He has struggled with thick secretions form his upper airway, but notes this is currently managing okay, using Flonase.  He has a history of COPD. He is managed on albuterol and Spiriva, though he has been out of the Spiriva for some time.  ?  ?[name] spent some time discussing struggles he is having with his wife. He notes he does not have a depressed mood. He finds his wife has frequent angry outbursts. He also sees dissatisfaction she has with her relationship with her children. [name] does find himself isolating in his workshop. He enjoys woodworking, but also recognizes that he is avoiding conflict with is wife. He feels confused that one day she can be quite loving ad then next very angry and upset with him.  ? ?Past Medical History: ?Patient Active  Problem List  ? Diagnosis Date Noted  ? History of hip fracture 04/24/2021  ? AAA (abdominal aortic aneurysm) 03/03/2021  ? Chronic allergic rhinitis 01/24/2021  ? Demand ischemia ([place])   ? Acute on chronic respiratory failure with hypoxia (HCC)   ? Elevated troponin   ? Chronic diastolic heart failure ([place]) 12/03/2020  ? Steatosis of liver 11/01/2020  ? 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 Hepatitis B core antibody positive 11/01/2020  ? Benign prostatic hyperplasia 11/01/2020  ? Chronic obstructive pulmonary disease ([place]) 07/20/2020  ? History of placement of stent in LAD coronary artery 07/20/2020  ? 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  ? TIA (transient ischemic attack)   ? Esophageal stricture   ? 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[place]) 04/19/2020  ? Primary osteoarthritis of both hands 01/11/2020  ? Piriformis syndrome of both sides 09/23/2019  ? Pain of both shoulder joints 07/29/2019  ? Sciatica associated with disorder of lumbar spine 04/24/2019  ? Primary osteoarthritis involving multiple joints 04/24/2019  ? Chronic pain syndrome 04/24/2019  ? Anxiety and depression 04/15/2018  ? Cancer of tonsillar fossa ([place]) 09/20/2017  ? Liver abscess 07/10/2016  ? Aneurysm of infrarenal abdominal aorta 07/10/2016  ? Hypochromic anemia 07/10/2016  ? Hypertension 05/19/2013  ? Osteoarthritis 05/29/2009  ? Psoriasis 01/13/2009  ? Coronary artery disease 09/15/2008  ? Severe aortic stenosis 01/28/2008  ? Diverticulosis of colon 08/27/2007  ? History of benign prostatic  hyperplasia 08/27/2007  ? Hyperlipidemia 03/19/2007  ? Gastroesophageal reflux disease 03/19/2007  ? ?Past Surgical History:  ?Procedure Laterality Date  ? ABDOMINAL AORTIC ENDOVASCULAR STENT GRAFT N/A 03/03/2021  ? Procedure: ABDOMINAL AORTIC ENDOVASCULAR STENT GRAFT;  Surgeon: Waynetta Sandy, MD;  Location: [place];  Service: Vascular;  Laterality: N/A;  ? ABDOMINAL AORTOGRAM W/LOWER  EXTREMITY N/A 04/10/2021  ? Procedure: ABDOMINAL AORTOGRAM W/LOWER EXTREMITY;  Surgeon: Waynetta Sandy, MD;  Location: [facility];  Service: Cardiovascular;  Laterality: N/A;  ? BACK SURGERY    ? CARDIAC CATHETERIZATION    ? CATARACT EXTRACTION W/ INTRAOCULAR LENS  IMPLANT, BILATERAL Bilateral 01/2012 - 02/2012  ? CORONARY ATHERECTOMY N/A 04/27/2020  ? Procedure: CORONARY ATHERECTOMY;  Surgeon: Sherren Mocha, MD;  Location: [facility];  Service: Cardiovascular;  Laterality: N/A;  ? CORONARY STENT INTERVENTION N/A 04/27/2020  ? Procedure: CORONARY STENT INTERVENTION;  Surgeon: Sherren Mocha, MD;  Location: [facility];  Service: Cardiovascular;  Laterality: N/A;  ? DIRECT LARYNGOSCOPY Left 10/09/2017  ? Procedure: DIRECT LARYNGOSCOPY WITH BOPSY;  Surgeon: Jodi Marble, MD;  Location: [facility];  Service: ENT;  Laterality: Left;  ? ESOPHAGOGASTRODUODENOSCOPY (EGD) WITH ESOPHAGEAL DILATION    ? "couple times" (07/19/2016)  ? ESOPHAGOSCOPY Left 10/09/2017  ? Procedure: ESOPHAGOSCOPY;  Surgeon: Jodi Marble, MD;  Location: [facility] [place];  Service: ENT;  Laterality: Left;  ? INTRAVASCULAR IMAGING/OCT N/A 04/27/2020  ? Procedure: INTRAVASCULAR IMAGING/OCT;  Surgeon: Sherren Mocha, MD;  Location: [place];  Service: Cardiovascular;  Laterality: N/A;  ? INTRAVASCULAR PRESSURE WIRE/FFR STUDY N/A 04/27/2020  ? Procedure: INTRAVASCULAR PRESSURE WIRE/FFR STUDY;  Surgeon: Sherren Mocha, MD;  Location: [facility];  Service: Cardiovascular;  Laterality: N/A;  ? IR GASTROSTOMY TUBE MOD SED  01/08/2018  ? IR THORACENTESIS ASP PLEURAL SPACE W/IMG GUIDE  07/19/2016  ? [place]  ? [place] SURGERY  05/1996  ? L4-5; Dr. Sherwood Gambler   ? LUMBAR LAMINECTOMY/DECOMPRESSION MICRODISCECTOMY  10/2002  ? L3-4. Dr. Sherwood Gambler  ? MULTIPLE TOOTH EXTRACTIONS  1980s  ? PARTIAL GLOSSECTOMY  12/02/2017  ? Dr. Nicolette Bang- [facility]  ? pharyngoplasty  for closure of tingue base defect  12/02/2017  ? Dr. Nicolette Bang- [facility]  ? PICC LINE INSERTION  07/15/2016  ? POSTERIOR LUMBAR FUSION  09/1996  ? Ray cage, L4-5 Dr. Rita Ohara  ? PROSTATE BIOPSY  ~ 2017  ? radical tonsillectomy Left 12/02/2017  ? Dr. Nicolette Bang at [facility]  ? RIGHT HEART CATH AND CORONARY ANGIOGRAPHY N/A 03/31/2020  ? Procedure: RIGHT HEART CATH AND CORONARY ANGIOGRAPHY;  Surgeon: Larey Dresser, MD;  Location: [place];  Service: Cardiovascular;  Laterality: N/A;  ? RIGID BRONCHOSCOPY Left 10/09/2017  ? Procedure: RIGID BRONCHOSCOPY;  Surgeon: Jodi Marble, MD;  Location: [facility];  Service: ENT;  Laterality: Left;  ? TEE WITHOUT CARDIOVERSION N/A 06/07/2020  ? Procedure: TRANSESOPHAGEAL ECHOCARDIOGRAM (TEE);  Surgeon: Sherren Mocha, MD;  Location: [place];  Service: Open Heart Surgery;  Laterality: N/A;  ? TONSILLECTOMY    ? TOTAL HIP ARTHROPLASTY Left 12/04/2020  ? Procedure: TOTAL HIP ARTHROPLASTY ANTERIOR APPROACH;  Surgeon: Rod Can, MD;  Location: [facility];  Service: Orthopedics;  Laterality: Left;  ? TOTAL HIP ARTHROPLASTY Left 11/2020  ? TRACHEOSTOMY  12/02/2017  ? Dr. Nicolette [facility]- [facility]  ? ULTRASOUND GUIDANCE FOR VASCULAR ACCESS Right 06/07/2020  ? Procedure: ULTRASOUND GUIDANCE FOR VASCULAR ACCESS;  Surgeon: Sherren Mocha, MD;  [facility]: [facility];  Service: Open Heart Surgery;  Laterality: Right;  ? ULTRASOUND GUIDANCE FOR VASCULAR ACCESS Bilateral 03/03/2021  ? Procedure: ULTRASOUND GUIDANCE FOR VASCULAR ACCESS, BILATERAL FEMORAL ARTERIES;  Surgeon: Waynetta Sandy, MD;  Location: [place];  Service: Vascular;  Laterality: Bilateral;  ? VASCULAR SURGERY    ? ?Family History  ?Problem Relation Age of Onset  ? Heart disease Father 70  ?     Living  ? Coronary artery disease Father   ?     CABG  ? Alzheimer's disease Mother 69  ?     Deceased  ? Arthritis Mother   ? Aneurysm Brother   ? Stomach cancer Maternal Uncle   ? Brain cancer Maternal Aunt   ?     x2  ?  Obesity Daughter   ?     Had Bypass Sx  ? ?Outpatient Medications Prior to Visit  ?Medication Sig Dispense Refill  ? acetaminophen (TYLENOL) 500 MG tablet Take by mouth.    ? albuterol (VENTOLIN HFA) 108 (90

## 2021-04-25 ENCOUNTER — Encounter (HOSPITAL_COMMUNITY): Payer: Self-pay

## 2021-04-25 ENCOUNTER — Ambulatory Visit (HOSPITAL_COMMUNITY)
Admission: RE | Admit: 2021-04-25 | Discharge: 2021-04-25 | Disposition: A | Discharge status: Home / Self Care | Payer: Medicare Other | Source: Ambulatory Visit | Attending: Family Medicine | Admitting: Family Medicine

## 2021-04-25 VITALS — BP 148/62 | HR 80 | Wt 178.0 lb

## 2021-04-25 DIAGNOSIS — Z87891 Personal history of nicotine dependence: Secondary | ICD-10-CM | POA: Diagnosis not present

## 2021-04-25 DIAGNOSIS — I714 Abdominal aortic aneurysm, without rupture, unspecified: Secondary | ICD-10-CM | POA: Insufficient documentation

## 2021-04-25 DIAGNOSIS — Z7902 Long term (current) use of antithrombotics/antiplatelets: Secondary | ICD-10-CM | POA: Diagnosis not present

## 2021-04-25 DIAGNOSIS — I10 Essential (primary) hypertension: Secondary | ICD-10-CM

## 2021-04-25 DIAGNOSIS — E7849 Other hyperlipidemia: Secondary | ICD-10-CM | POA: Diagnosis not present

## 2021-04-25 DIAGNOSIS — I350 Nonrheumatic aortic (valve) stenosis: Secondary | ICD-10-CM | POA: Insufficient documentation

## 2021-04-25 DIAGNOSIS — Z79899 Other long term (current) drug therapy: Secondary | ICD-10-CM | POA: Diagnosis not present

## 2021-04-25 DIAGNOSIS — E785 Hyperlipidemia, unspecified: Secondary | ICD-10-CM | POA: Diagnosis not present

## 2021-04-25 DIAGNOSIS — M353 Polymyalgia rheumatica: Secondary | ICD-10-CM | POA: Insufficient documentation

## 2021-04-25 DIAGNOSIS — Z952 Presence of prosthetic heart valve: Secondary | ICD-10-CM

## 2021-04-25 DIAGNOSIS — Z955 Presence of coronary angioplasty implant and graft: Secondary | ICD-10-CM | POA: Diagnosis not present

## 2021-04-25 DIAGNOSIS — I5032 Chronic diastolic (congestive) heart failure: Secondary | ICD-10-CM | POA: Insufficient documentation

## 2021-04-25 DIAGNOSIS — I110 Hypertensive heart disease with heart failure: Secondary | ICD-10-CM | POA: Insufficient documentation

## 2021-04-25 DIAGNOSIS — I6529 Occlusion and stenosis of unspecified carotid artery: Secondary | ICD-10-CM

## 2021-04-25 DIAGNOSIS — I7143 Infrarenal abdominal aortic aneurysm, without rupture: Secondary | ICD-10-CM

## 2021-04-25 DIAGNOSIS — I2510 Atherosclerotic heart disease of native coronary artery without angina pectoris: Secondary | ICD-10-CM | POA: Diagnosis not present

## 2021-04-25 LAB — BASIC METABOLIC PANEL
Anion gap: 13 (ref 5–15)
BUN: 15 mg/dL (ref 8–23)
CO2: 30 mmol/L (ref 22–32)
Calcium: 8.9 mg/dL (ref 8.9–10.3)
Chloride: 97 mmol/L — ABNORMAL LOW (ref 98–111)
Creatinine, Ser: 0.67 mg/dL (ref 0.61–1.24)
GFR, Estimated: >60 mL/min (ref >60)
Glucose, Bld: 125 mg/dL — ABNORMAL HIGH (ref 70–99)
Potassium: 3.3 mmol/L — ABNORMAL LOW (ref 3.5–5.1)
Sodium: 140 mmol/L (ref 135–145)

## 2021-04-25 NOTE — Patient Instructions (Signed)
It was great to see you today! ?No medication changes are needed at this time. ? ?Labs today ?We will only contact you if something comes back abnormal or we need to make some changes. ?Otherwise no news is good news! ? ?Your physician recommends that you schedule a follow-up appointment in: 4 months with Dr Aundra Dubin ?-please give our office a call in June to schedule a July follow up ? ?Do the following things EVERYDAY: ?Weigh yourself in the morning before breakfast. Write it down and keep it in a log. ?Take your medicines as prescribed ?Eat low salt foods--Limit salt (sodium) to 2000 mg per day.  ?Stay as active as you can everyday ?Limit all fluids for the day to less than 2 liters ? ?At the [facility], you and your health needs are our priority. As part of our continuing mission to provide you with exceptional heart care, we have created designated Provider Care Teams. These Care Teams include your primary Cardiologist (physician) and Advanced Practice Providers (APPs- Physician Assistants and Nurse Practitioners) who all work together to provide you with the care you need, when you need it.  ? ?You may see any of the following providers on your designated Care Team at your next follow up: ?Dr Glori Bickers ?Dr Loralie Champagne ?Darrick Grinder, NP ?Lyda Jester, PA ?Jessica Milford,NP ?Marlyce Huge, PA ?Audry Riles, PharmD ? ? ?Please be sure to bring in all your medications bottles to every appointment.  ? ?If you have any questions or concerns before your next appointment please send [place] a message through [place] or call our office at [phone].   ? ?TO LEAVE A MESSAGE FOR THE NURSE SELECT OPTION 2, PLEASE LEAVE A MESSAGE INCLUDING: ?YOUR NAME ?DATE OF BIRTH ?CALL BACK NUMBER ?REASON FOR CALL**this is important as we prioritize the call backs ? ?YOU WILL RECEIVE A CALL BACK THE SAME DAY AS LONG AS YOU CALL BEFORE 4:00 PM ? ? ?

## 2021-05-01 ENCOUNTER — Telehealth (HOSPITAL_COMMUNITY): Payer: Self-pay | Admitting: Cardiology

## 2021-05-01 DIAGNOSIS — I5032 Chronic diastolic (congestive) heart failure: Secondary | ICD-10-CM

## 2021-05-01 MED ORDER — POTASSIUM CHLORIDE ER 10 MEQ PO TBCR: 20.0000 meq | EXTENDED_RELEASE_TABLET | Freq: Two times a day (BID) | ORAL | 11 refills | Status: DC

## 2021-05-01 NOTE — Telephone Encounter (Signed)
Patient called.  Patient aware.  

## 2021-05-01 NOTE — Telephone Encounter (Signed)
-----   Message from Brandon [name], [place] sent at 04/28/2021  7:57 AM EDT ----- ?K is low. Please ensure he has been taking KCL suppl consistently. If he has, increase to 20 mEq bid. Repeat BMET in 7-10 days. ?

## 2021-05-02 DIAGNOSIS — M5136 Other intervertebral disc degeneration, lumbar region: Secondary | ICD-10-CM | POA: Diagnosis not present

## 2021-05-02 DIAGNOSIS — M47816 Spondylosis without myelopathy or radiculopathy, lumbar region: Secondary | ICD-10-CM | POA: Diagnosis not present

## 2021-05-02 DIAGNOSIS — M961 Postlaminectomy syndrome, not elsewhere classified: Secondary | ICD-10-CM | POA: Diagnosis not present

## 2021-05-03 ENCOUNTER — Encounter: Payer: Self-pay | Admitting: Family Medicine

## 2021-05-03 DIAGNOSIS — M5136 Other intervertebral disc degeneration, lumbar region: Secondary | ICD-10-CM | POA: Insufficient documentation

## 2021-05-03 DIAGNOSIS — M47816 Spondylosis without myelopathy or radiculopathy, lumbar region: Secondary | ICD-10-CM | POA: Insufficient documentation

## 2021-05-03 DIAGNOSIS — M961 Postlaminectomy syndrome, not elsewhere classified: Secondary | ICD-10-CM | POA: Insufficient documentation

## 2021-05-03 DIAGNOSIS — M51369 Other intervertebral disc degeneration, lumbar region without mention of lumbar back pain or lower extremity pain: Secondary | ICD-10-CM | POA: Insufficient documentation

## 2021-05-08 ENCOUNTER — Ambulatory Visit (HOSPITAL_COMMUNITY)
Admission: RE | Admit: 2021-05-08 | Discharge: 2021-05-08 | Disposition: A | Discharge status: Home / Self Care | Payer: Medicare Other | Source: Ambulatory Visit | Attending: Internal Medicine | Admitting: Internal Medicine

## 2021-05-08 ENCOUNTER — Other Ambulatory Visit: Payer: Self-pay | Admitting: Physical Medicine and Rehabilitation

## 2021-05-08 DIAGNOSIS — I5032 Chronic diastolic (congestive) heart failure: Secondary | ICD-10-CM | POA: Insufficient documentation

## 2021-05-08 LAB — BASIC METABOLIC PANEL
Anion gap: 9 (ref 5–15)
BUN: 22 mg/dL (ref 8–23)
CO2: 28 mmol/L (ref 22–32)
Calcium: 8.9 mg/dL (ref 8.9–10.3)
Chloride: 98 mmol/L (ref 98–111)
Creatinine, Ser: 0.8 mg/dL (ref 0.61–1.24)
GFR, Estimated: >60 mL/min (ref >60)
Glucose, Bld: 117 mg/dL — ABNORMAL HIGH (ref 70–99)
Potassium: 4.3 mmol/L (ref 3.5–5.1)
Sodium: 135 mmol/L (ref 135–145)

## 2021-05-09 ENCOUNTER — Other Ambulatory Visit: Payer: Self-pay

## 2021-05-09 DIAGNOSIS — I714 Abdominal aortic aneurysm, without rupture, unspecified: Secondary | ICD-10-CM

## 2021-05-18 ENCOUNTER — Other Ambulatory Visit: Payer: Self-pay | Admitting: *Deleted

## 2021-05-18 MED ORDER — PREDNISONE 5 MG PO TABS: 5.0000 mg | ORAL_TABLET | Freq: Every day | ORAL | 0 refills | Status: DC

## 2021-05-18 NOTE — Telephone Encounter (Signed)
Next Visit: 07/06/2021 ? ?Last Visit: 03/30/2021 ? ?Last Fill: 01/26/2021 ? ?Dx: Polymyalgia rheumatica  ? ?Current Dose per office note on 03/30/2021: He is currently taking prednisone 9 mg daily.  The plan is to continue to try to taper prednisone by 1 mg every 2 months. ? ?Spoke with patient and he states he is taking Prednisone 9 my daily. Patient due to taper to 8 mg on 05/28/2021.  ? ?Okay to refill Prednisone?   ?

## 2021-05-22 ENCOUNTER — Other Ambulatory Visit: Payer: Self-pay | Admitting: Physician Assistant

## 2021-05-22 NOTE — Telephone Encounter (Signed)
Next Visit: 07/06/2021 ?  ?Last Visit: 03/30/2021 ?  ?Last Fill: 01/26/2021 ?  ?Dx: Polymyalgia rheumatica  ?  ?Current Dose per office note on 03/30/2021: He is currently taking prednisone 9 mg daily.  The plan is to continue to try to taper prednisone by 1 mg every 2 months. ?  ?Spoke with patient and he states he is taking Prednisone 9 my daily. Patient due to taper to 8 mg on 05/28/2021.  ?  ?Okay to refill Prednisone?   ?

## 2021-05-24 ENCOUNTER — Ambulatory Visit (HOSPITAL_COMMUNITY)
Admission: RE | Admit: 2021-05-24 | Discharge: 2021-05-24 | Disposition: A | Discharge status: Home / Self Care | Payer: Medicare Other | Source: Ambulatory Visit | Attending: Vascular Surgery | Admitting: Vascular Surgery

## 2021-05-24 ENCOUNTER — Encounter: Payer: Self-pay | Admitting: Vascular Surgery

## 2021-05-24 ENCOUNTER — Ambulatory Visit (INDEPENDENT_AMBULATORY_CARE_PROVIDER_SITE_OTHER): Payer: Medicare Other | Admitting: Vascular Surgery

## 2021-05-24 VITALS — BP 168/72 | HR 65 | Temp 98.1°F | Resp 20 | Ht 66.5 in | Wt 180.4 lb

## 2021-05-24 DIAGNOSIS — I714 Abdominal aortic aneurysm, without rupture, unspecified: Secondary | ICD-10-CM | POA: Diagnosis not present

## 2021-05-24 NOTE — Progress Notes (Signed)
?  ? ?  Subjective:  ?  ? Patient ID: Brandon [name], male   DOB: [birth year], 81 y.o.   MRN: [id number] ? ?HPI 81 year old male status post endovascular aneurysm repair subsequently underwent stent of both limbs for compression of the left iliac limb.  He continues to have back pain which is musculoskeletal in nature he is followed by [facility] with plans for injections tomorrow and again in 2 weeks.  He has no new abdominal pain or acute back pain. ? ? ?Review of Systems ?Back pain ?   ?Objective:  ? Physical Exam ?Vitals:  ? 05/24/21 0918  ?BP: (!) 168/72  ?Pulse: 65  ?Resp: 20  ?Temp: 98.1 ?F (36.7 ?C)  ?SpO2: 95%  ? ?Awake alert oriented ?Nonlabored respirations ?Abdomen is soft ?Bilateral femoral pulses are palpable ? ?Abdominal Aorta Findings:  ?+--------+-------+----------+----------+--------+--------+--------+  ?LocationAP (cm)Trans (cm)PSV (cm/s)WaveformThrombusComments  ?+--------+-------+----------+----------+--------+--------+--------+  ?Proximal1.99   2.01                                          ?+--------+-------+----------+----------+--------+--------+--------+  ? ? ? ?Endovascular Aortic Repair (EVAR):  ?+----------+----------------+-------------------+-------------------+  ?          Diameter AP (cm)Diameter Trans (cm)Velocities (cm/sec)  ?+----------+----------------+-------------------+-------------------+  ?Aorta     5.12            5.18               101                  ?+----------+----------------+-------------------+-------------------+  ?Right Limb1.31            1.20               122                  ?+----------+----------------+-------------------+-------------------+  ?Left Limb 1.26            1.34               88                   ?+----------+----------------+-------------------+-------------------+  ? ? ?   ? ? ?Summary:  ?Abdominal Aorta: Patent endovascular aneurysm repair with no evidence of  ?endoleak. Unable to visualize endoleak.  ?Stenosis:  +------------------+-----------+  ?Location          Stent        ?+------------------+-----------+  ?Right Common Iliacno stenosis  ?+------------------+-----------+  ?Left Common Iliac no stenosis  ?+------------------+-----------+  ?   ?Assessment:  ?   ?81 year old male status post endovascular aneurysm repair with subsequent relining of his iliac limbs for stenosis of the left side without any visualized stenosis today and previously there was an endoleak which was not visualized either. ?   ?Plan:  ?   ?Follow-up will be in 8 months with endovascular aneurysm duplex. ? ?Hendy Brindle C. Donzetta Matters, MD ?[facility] ?Office: [phone] ?Pager: [phone] ? ?   ?

## 2021-05-25 ENCOUNTER — Other Ambulatory Visit (HOSPITAL_COMMUNITY): Payer: Self-pay

## 2021-05-25 DIAGNOSIS — M47816 Spondylosis without myelopathy or radiculopathy, lumbar region: Secondary | ICD-10-CM | POA: Diagnosis not present

## 2021-05-25 MED ORDER — EZETIMIBE 10 MG PO TABS: 10.0000 mg | ORAL_TABLET | Freq: Every day | ORAL | 5 refills | Status: DC

## 2021-05-26 ENCOUNTER — Other Ambulatory Visit: Payer: Self-pay | Admitting: *Deleted

## 2021-05-26 DIAGNOSIS — Z9889 Other specified postprocedural states: Secondary | ICD-10-CM

## 2021-05-30 ENCOUNTER — Telehealth: Payer: Self-pay | Admitting: *Deleted

## 2021-05-30 ENCOUNTER — Other Ambulatory Visit: Payer: Self-pay | Admitting: Cardiovascular Disease

## 2021-05-30 MED ORDER — OXYCODONE HCL 10 MG PO TABS: 10.0000 mg | ORAL_TABLET | Freq: Four times a day (QID) | ORAL | 0 refills | Status: DC | PRN

## 2021-05-30 NOTE — Telephone Encounter (Signed)
Mr Hazzard called for a refill on his oxycodone. Last fill date was 04/19/21 and next appt is Friday. ?

## 2021-06-02 ENCOUNTER — Encounter
Payer: Medicare Other | Attending: Physical Medicine and Rehabilitation | Admitting: Physical Medicine and Rehabilitation

## 2021-06-02 ENCOUNTER — Encounter: Payer: Self-pay | Admitting: Physical Medicine and Rehabilitation

## 2021-06-02 VITALS — BP 123/62 | HR 63 | Ht 66.5 in | Wt 179.0 lb

## 2021-06-02 DIAGNOSIS — M353 Polymyalgia rheumatica: Secondary | ICD-10-CM | POA: Diagnosis not present

## 2021-06-02 DIAGNOSIS — Z5181 Encounter for therapeutic drug level monitoring: Secondary | ICD-10-CM | POA: Diagnosis not present

## 2021-06-02 DIAGNOSIS — G894 Chronic pain syndrome: Secondary | ICD-10-CM | POA: Diagnosis not present

## 2021-06-02 DIAGNOSIS — Z79891 Long term (current) use of opiate analgesic: Secondary | ICD-10-CM | POA: Insufficient documentation

## 2021-06-02 DIAGNOSIS — M961 Postlaminectomy syndrome, not elsewhere classified: Secondary | ICD-10-CM | POA: Insufficient documentation

## 2021-06-02 MED ORDER — GABAPENTIN 600 MG PO TABS: 600.0000 mg | ORAL_TABLET | Freq: Two times a day (BID) | ORAL | 5 refills | Status: DC

## 2021-06-02 MED ORDER — TRAMADOL HCL 50 MG PO TABS: 100.0000 mg | ORAL_TABLET | Freq: Two times a day (BID) | ORAL | 5 refills | Status: DC

## 2021-06-02 NOTE — Patient Instructions (Signed)
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 also dx'd with polymyalgia rheumatica  as well as needed aortic valve replacement  last year- ?Here for f/u on chronic pain. S/p L THR- 12/02/20  ?Getting AAA as of 1/23.  ? ? ?Will increase gabapentin to 600 mg tablets 2x/day x 1 week, then 600 mg 3x/day- for back and nerve pain- watch for slowed cognition or more sleepiness. Call me if any symptoms.  ? ?2. Con't Tramadol 100 mg BID with 5 refills.  ? ? ?3. Just refilled Oxycodone 10 mg q6 hours prn- #120- on 4/25.  ? ?4. Is due to get urine drug screen today- never had an issue before.   ? ?5. F/U in 3 months- f/u on PMR and chronic pain.  ?

## 2021-06-02 NOTE — Progress Notes (Signed)
? ?Subjective:  ? ? Patient ID: Brandon [name], male    DOB: 12-01-1940, 81 y.o.   MRN: [id number] ? 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 also dx'd with polymyalgia rheumatica  as well as needed aortic valve replacement  last year- ?Here for f/u on chronic pain. S/p L THR- 12/02/20  ?Got  AAA fixed 1/23.  ? ?Got 2 shots in back last week- didn't help at all.  ?Sounds like epidural steroid injections and then scheduled for radiofrequency ablation.  ? ?More of pain in lower back, but not in middle back anymore-has to talk to [facility] about it.  ? ?Cannot do normal yard work anymore.  ?Disheartened about it.  ? ?Oxycodone works on everything but low back pain and having numbness that's new in legs ?Takes cane with him, because feels like RLE is weak- and will collapse on him.  ? ? ?Taking Gabapentin 300 mg TID- is on low dose.  ?Back pain is the only thing that doesn't respond to Oxycodone.  ? ?Needs tramadol Rx- has a few left.  ?Gabapentin is low.   ? ? ?Hands aren't hurting as bad- ~ 50% better and joints not hurting as much all over-  ? ?Built some handrails for side porch- does a lot of wood working. At end of day, hands give him fit ,but treated with Oxycodone if needs it.  ?Sleeps well at night.  ? ?Uses aspercreme- likes that for hand aching/pain. Works for a few hours.  ?Pain Inventory ?Average Pain 4 ?Pain Right Now 4 ?My pain is intermittent, sharp, and aching ? ?In the last 24 hours, has pain interfered with the following? ?General activity 5 ?Relation with others 5 ?Enjoyment of life 5 ?What TIME of day is your pain at its worst? daytime ?Sleep (in general) Good ? ?Pain is worse with: walking, bending, standing, and some activites ?Pain improves with: rest  and medication ?Relief from Meds:  6 ? ? ? ?Family History  ?Problem Relation Age of Onset  ? Heart disease Father 19  ?     Living  ? Coronary artery disease Father   ?     CABG  ? Alzheimer's disease Mother 31  ?     Deceased  ? Arthritis Mother   ? Aneurysm Brother   ? Stomach cancer Maternal Uncle   ? Brain cancer Maternal Aunt   ?     x2  ? Obesity Daughter   ?     Had Bypass Sx  ? ?Social History  ? ?Socioeconomic History  ? Marital status: Married  ?  Spouse name: etta  ? Number of children: 2  ? Years of education: Not on file  ? Highest education level: Not on file  ?Occupational History  ? Occupation: retired  ?  Employer: RETIRED  ?  Comment: disabled due to back problems  ?Tobacco Use  ? Smoking status: Former  ?  Packs/day: 1.50  ?  Years: 50.00  ?  Pack years: 75.00  ?  Types: Cigarettes  ?  Quit date: 09/05/2017  ?  Years since quitting: 3.7  ? Smokeless tobacco: Never  ? Tobacco comments:  ?  smoked less than 1 ppd for 40+ years;   ?Vaping Use  ? Vaping Use: Never used  ?Substance and Sexual Activity  ? Alcohol use: Yes  ?  Alcohol/week: 0.0 standard drinks  ?  Comment: Occasionally,not a heavy drinker   ? Drug use: Yes  ?  Types: Oxycodone  ?  Comment: has prescription  ? Sexual activity: Not Currently  ?Other Topics Concern  ? Not on file  ?Social History Narrative  ? ** Merged History Encounter **  ?    ? Married (3rd), [place]. 2 children from 1st marriage, 4 step children.   ? Retired on disability due to back   ? Former Futures trader  ? Ex-military.  ? restores antique furniture for a hobby.   ?   ? Cell # [id number]  ? ?Social Determinants of Health  ? ?Financial Resource Strain: Not on file  ?Food Insecurity: Not on file  ?Transportation Needs: Not on file  ?Physical Activity: Not on file  ?Stress: Not on file  ?Social Connections: Not on file  ? ?Past Surgical History:  ?Procedure Laterality Date  ? ABDOMINAL AORTIC ENDOVASCULAR STENT GRAFT N/A 03/03/2021  ? Procedure: ABDOMINAL AORTIC  ENDOVASCULAR STENT GRAFT;  Surgeon: Waynetta Sandy, MD;  Location: [place];  Service: Vascular;  Laterality: N/A;  ? ABDOMINAL AORTOGRAM W/LOWER EXTREMITY N/A 04/10/2021  ? Procedure: ABDOMINAL AORTOGRAM W/LOWER EXTREMITY;  Surgeon: Waynetta Sandy, MD;  Location: [place] [facility];  Service: Cardiovascular;  Laterality: N/A;  ? BACK SURGERY    ? CARDIAC CATHETERIZATION    ? CATARACT EXTRACTION W/ INTRAOCULAR LENS  IMPLANT, BILATERAL Bilateral 01/2012 - 02/2012  ? CORONARY ATHERECTOMY N/A 04/27/2020  ? Procedure: CORONARY ATHERECTOMY;  Surgeon: Sherren Mocha, MD;  Location: [place] [facility];  Service: Cardiovascular;  Laterality: N/A;  ? CORONARY STENT INTERVENTION N/A 04/27/2020  ? Procedure: CORONARY STENT INTERVENTION;  Surgeon: Sherren Mocha, MD;  Location: [place] [facility];  Service: Cardiovascular;  Laterality: N/A;  ? DIRECT LARYNGOSCOPY Left 10/09/2017  ? Procedure: DIRECT LARYNGOSCOPY WITH BOPSY;  Surgeon: Jodi Marble, MD;  Location: [place];  Service: ENT;  Laterality: Left;  ? ESOPHAGOGASTRODUODENOSCOPY (EGD) WITH ESOPHAGEAL DILATION    ? "couple times" (07/19/2016)  ? ESOPHAGOSCOPY Left 10/09/2017  ? Procedure: ESOPHAGOSCOPY;  Surgeon: Jodi Marble, MD;  Location: [place];  Service: ENT;  Laterality: Left;  ? INTRAVASCULAR IMAGING/OCT N/A 04/27/2020  ? Procedure: INTRAVASCULAR IMAGING/OCT;  Surgeon: Sherren Mocha, MD;  Location: [place] [facility];  Service: Cardiovascular;  Laterality: N/A;  ? INTRAVASCULAR PRESSURE WIRE/FFR STUDY N/A 04/27/2020  ? Procedure: INTRAVASCULAR PRESSURE WIRE/FFR STUDY;  Surgeon: Sherren Mocha, MD;  Location: [place] [facility];  Service: Cardiovascular;  Laterality: N/A;  ? IR GASTROSTOMY TUBE MOD SED  01/08/2018  ? IR THORACENTESIS ASP PLEURAL SPACE W/IMG GUIDE  07/19/2016  ? [place]  ? [place] SURGERY  05/1996  ? L4-5; Dr. Sherwood Gambler   ? LUMBAR LAMINECTOMY/DECOMPRESSION  MICRODISCECTOMY  10/2002  ? L3-4. Dr. Sherwood Gambler  ? MULTIPLE TOOTH EXTRACTIONS  1980s  ? PARTIAL GLOSSECTOMY  12/02/2017  ? Dr. [facility]- [facility]  ? pharyngoplasty for closure of tingue base defect  12/02/2017  ? Dr. [facility]icolette [facility]- [facility]  ? PICC LINE INSERTION  07/15/2016  ? POSTERIOR LUMBAR FUSION  09/1996  ? Ray cage, L4-5 Dr. Rita Ohara  ? PROSTATE BIOPSY  ~ 2017  ? radical tonsillectomy Left 12/02/2017  ? Dr[facility] at [facility]  ? RIGHT  HEART CATH AND CORONARY ANGIOGRAPHY N/A 03/31/2020  ? Procedure: RIGHT HEART CATH AND CORONARY ANGIOGRAPHY;  Surgeon: Larey Dresser, MD;  Location: [place] [facility];  Service: Cardiovascular;  Laterality: N/A;  ? RIGID BRONCHOSCOPY Left 10/09/2017  ? Procedure: RIGID BRONCHOSCOPY;  Surgeon: Jodi Marble, MD;  Location: [place];  Service: ENT;  Laterality: Left;  ? TEE WITHOUT CARDIOVERSION N/A 06/07/2020  ? Procedure: TRANSESOPHAGEAL ECHOCARDIOGRAM (TEE);  Surgeon: Sherren Mocha, MD;  Location: [place];  Service: Open Heart Surgery;  Laterality: N/A;  ? TONSILLECTOMY    ? TOTAL HIP ARTHROPLASTY Left 12/04/2020  ? Procedure: TOTAL HIP ARTHROPLASTY ANTERIOR APPROACH;  Surgeon: Rod Can, MD;  [facility];  Service: Orthopedics;  Laterality: Left;  ? TOTAL HIP ARTHROPLASTY Left 11/2020  ? TRACHEOSTOMY  12/02/2017  ? Dr. [facility]- [facility]  ? ULTRASOUND GUIDANCE FOR VASCULAR ACCESS Right 06/07/2020  ? Procedure: ULTRASOUND GUIDANCE FOR VASCULAR ACCESS;  Surgeon: Sherren Mocha, MD;  Location: [place];  Service: Open Heart Surgery;  Laterality: Right;  ? ULTRASOUND GUIDANCE FOR VASCULAR ACCESS Bilateral 03/03/2021  ? Procedure: ULTRASOUND GUIDANCE FOR VASCULAR ACCESS, BILATERAL FEMORAL ARTERIES;  Surgeon: Waynetta Sandy, MD;  Location: [place];  Service: Vascular;  Laterality: Bilateral;  ? VASCULAR SURGERY    ? ?Past Medical History:  ?Diagnosis Date  ? Abdominal aneurysm ([place])   ? Arthritis   ? "all over" (07/19/2016)  ? BPH (benign prostatic hypertrophy)   ?  CAD (coronary artery disease)   ? Chicken pox   ? Chronic lower back pain   ? s/p surgical fusion  ? Depression   ? Diverticulosis   ? Esophageal stricture   ? GERD (gastroesophageal reflux disease)   ? Hepat

## 2021-06-05 ENCOUNTER — Telehealth: Payer: Self-pay | Admitting: *Deleted

## 2021-06-05 LAB — TOXASSURE SELECT,+ANTIDEPR,UR

## 2021-06-05 NOTE — Telephone Encounter (Signed)
Urine drug screen for this encounter is consistent for prescribed medication 

## 2021-06-06 ENCOUNTER — Encounter: Payer: Self-pay | Admitting: Family Medicine

## 2021-06-06 ENCOUNTER — Ambulatory Visit (INDEPENDENT_AMBULATORY_CARE_PROVIDER_SITE_OTHER): Payer: Medicare Other | Admitting: Family Medicine

## 2021-06-06 VITALS — BP 116/60 | HR 66 | Temp 97.5°F | Ht 66.5 in | Wt 178.2 lb

## 2021-06-06 DIAGNOSIS — J209 Acute bronchitis, unspecified: Secondary | ICD-10-CM

## 2021-06-06 DIAGNOSIS — J42 Unspecified chronic bronchitis: Secondary | ICD-10-CM | POA: Diagnosis not present

## 2021-06-06 DIAGNOSIS — M961 Postlaminectomy syndrome, not elsewhere classified: Secondary | ICD-10-CM | POA: Diagnosis not present

## 2021-06-06 MED ORDER — DOXYCYCLINE HYCLATE 100 MG PO TABS: 100.0000 mg | ORAL_TABLET | Freq: Two times a day (BID) | ORAL | 0 refills | Status: DC

## 2021-06-06 MED ORDER — PREDNISONE 20 MG PO TABS: 40.0000 mg | ORAL_TABLET | Freq: Every day | ORAL | 0 refills | Status: DC

## 2021-06-06 NOTE — Progress Notes (Signed)
?[facility] ?[facility] ?[address] ?[place] [address] ?Dept: [phone] ?Dept Fax: [phone] ? ?Office Visit ? ?Subjective:  ? ? Patient ID: Brandon [name], male    DOB: 22-May-1940, 81 y.o..   MRN: [id number] ? ?Chief Complaint  ?Patient presents with  ? Acute Visit  ?  C/o sinus drainage, LT ear clogged up, cough/chest congestion x 2-3 days.  No OTC meds.   ? ? ?History of Present Illness: ? ?Patient is in today for complaining of a 2-day history of increased cough productive of phlegm/mucous, chest congestion, left ear congestion, sinus pressure and drainage. 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 He also is on chronic steroids for polymyalgia rheumatica. He is using his typical inhalers and nasal sprays. ? ?Past Medical History: ?Patient Active Problem List  ? Diagnosis Date Noted  ? Lumbar postlaminectomy syndrome 05/03/2021  ? Degeneration of lumbar intervertebral disc 05/03/2021  ? Lumbar spondylosis 05/03/2021  ? History of hip fracture 04/24/2021  ? S/P abdominal aortic aneurysm repair 04/24/2021  ? Chronic allergic rhinitis 01/24/2021  ? Demand ischemia ([place])   ? Acute on chronic respiratory failure with hypoxia (HCC)   ? Elevated troponin   ? Chronic diastolic heart failure ([place]) 12/03/2020  ? Steatosis of liver 11/01/2020  ? 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 Hepatitis B core antibody positive 11/01/2020  ? Benign prostatic hyperplasia 11/01/2020  ? Chronic obstructive pulmonary disease ([place]) 07/20/2020  ? History of placement of stent in LAD coronary artery 07/20/2020  ? 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  ? TIA (transient ischemic attack)   ? Esophageal stricture   ? 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[place]) 04/19/2020  ? Primary osteoarthritis of both hands 01/11/2020  ? Piriformis syndrome of both sides 09/23/2019  ? Pain of both  shoulder joints 07/29/2019  ? Sciatica associated with disorder of lumbar spine 04/24/2019  ? Primary osteoarthritis involving multiple joints 04/24/2019  ? Chronic pain syndrome 04/24/2019  ? Anxiety and depression 04/15/2018  ? Cancer of tonsillar fossa ([place]) 09/20/2017  ? Liver abscess 07/10/2016  ? Hypochromic anemia 07/10/2016  ? Essential hypertension 05/19/2013  ? Osteoarthritis 05/29/2009  ? Psoriasis 01/13/2009  ? Coronary artery disease 09/15/2008  ? Diverticulosis of colon 08/27/2007  ? History of benign prostatic hyperplasia 08/27/2007  ? Hyperlipidemia 03/19/2007  ? Gastroesophageal reflux disease 03/19/2007  ? ?Past Surgical History:  ?Procedure Laterality Date  ? ABDOMINAL AORTIC ENDOVASCULAR STENT GRAFT N/A 03/03/2021  ? Procedure: ABDOMINAL AORTIC ENDOVASCULAR STENT GRAFT;  Surgeon: Waynetta Sandy, MD;  Location: [place];  Service: Vascular;  Laterality: N/A;  ? ABDOMINAL AORTOGRAM W/LOWER EXTREMITY N/A 04/10/2021  ? Procedure: ABDOMINAL AORTOGRAM W/LOWER EXTREMITY;  Surgeon: Waynetta Sandy, MD;  Location: [place] [facility];  Service: Cardiovascular;  Laterality: N/A;  ? BACK SURGERY    ? CARDIAC CATHETERIZATION    ? CATARACT EXTRACTION W/ INTRAOCULAR LENS  IMPLANT, BILATERAL Bilateral 01/2012 - 02/2012  ? CORONARY ATHERECTOMY N/A 04/27/2020  ? Procedure: CORONARY ATHERECTOMY;  Surgeon: Sherren Mocha, MD;  Location: [place] [facility];  Service: Cardiovascular;  Laterality: N/A;  ? CORONARY STENT INTERVENTION N/A 04/27/2020  ? Procedure: CORONARY STENT INTERVENTION;  Surgeon: Sherren Mocha, MD;  Location: [place] [facility];  Service: Cardiovascular;  Laterality: N/A;  ? DIRECT LARYNGOSCOPY Left 10/09/2017  ? Procedure: DIRECT LARYNGOSCOPY WITH BOPSY;  Surgeon: Jodi Marble, MD;  Location:  [place];  Service: ENT;  Laterality: Left;  ? ESOPHAGOGASTRODUODENOSCOPY (EGD) WITH ESOPHAGEAL DILATION    ? "couple times" (07/19/2016)  ? ESOPHAGOSCOPY Left 10/09/2017  ?  Procedure: ESOPHAGOSCOPY;  Surgeon: Jodi Marble, MD;  Location: [place];  Service: ENT;  Laterality: Left;  ? INTRAVASCULAR IMAGING/OCT N/A 04/27/2020  ? Procedure: INTRAVASCULAR IMAGING/OCT;  Surgeon: Sherren Mocha, MD;  Location: [place] [facility];  Service: Cardiovascular;  Laterality: N/A;  ? INTRAVASCULAR PRESSURE WIRE/FFR STUDY N/A 04/27/2020  ? Procedure: INTRAVASCULAR PRESSURE WIRE/FFR STUDY;  Surgeon: Sherren Mocha, MD;  Location: [place] [facility];  Service: Cardiovascular;  Laterality: N/A;  ? IR GASTROSTOMY TUBE MOD SED  01/08/2018  ? IR THORACENTESIS ASP PLEURAL SPACE W/IMG GUIDE  07/19/2016  ? [place]  ? [place] SURGERY  05/1996  ? L4-5; Dr. Sherwood Gambler   ? LUMBAR LAMINECTOMY/DECOMPRESSION MICRODISCECTOMY  10/2002  ? L3-4. Dr. Sherwood Gambler  ? MULTIPLE TOOTH EXTRACTIONS  1980s  ? PARTIAL GLOSSECTOMY  12/02/2017  ? Dr[facility]ico[facility]  ? pharyngoplasty for closure of tingue base defect  12/02/2017  ? Dr[facility]- [facility]  ? PICC LINE INSERTION  07/15/2016  ? POSTERIOR LUMBAR FUSION  09/1996  ? Ray cage, L4-5 Dr. Rita Ohara  ? PROSTATE BIOPSY  ~ 2017  ? radical tonsillectomy Left 12/02/2017  ? Dr. Nicolette Bang at [facility]  ? RIGHT HEART CATH AND CORONARY ANGIOGRAPHY N/A 03/31/2020  ? Procedure: RIGHT HEART CATH AND CORONARY ANGIOGRAPHY;  Surgeon: Larey Dresser, MD;  Location: [place] [facility];  Service: Cardiovascular;  Laterality: N/A;  ? RIGID BRONCHOSCOPY Left 10/09/2017  ? Procedure: RIGID BRONCHOSCOPY;  Surgeon: Jodi Marble, MD;  Location: [place];  Service: ENT;  Laterality: Left;  ? TEE WITHOUT CARDIOVERSION N/A 06/07/2020  ? Procedure: TRANSESOPHAGEAL ECHOCARDIOGRAM (TEE);  Surgeon: Sherren Mocha, MD;  Location: [place];  Service: Open Heart Surgery;  Laterality: N/A;  ? TONSILLECTOMY    ? TOTAL HIP ARTHROPLASTY Left 12/04/2020  ? Procedure: TOTAL HIP ARTHROPLASTY ANTERIOR APPROACH;  Surgeon: Rod Can, MD;  [facility];   Service: Orthopedics;  Laterality: Left;  ? TOTAL HIP ARTHROPLASTY Left 11/2020  ? TRACHEOSTOMY  12/02/2017  ? Dr. Nicolette Bang- [facility]  ? ULTRASOUND GUIDANCE FOR VASCULAR ACCESS Right 06/07/2020  ? Procedure: ULTRASOUND GUIDANCE FOR VASCULAR ACCESS;  Surgeon: Sherren Mocha, MD;  Location: [place];  Service: Open Heart Surgery;  Laterality: Right;  ? ULTRASOUND GUIDANCE FOR VASCULAR ACCESS Bilateral 03/03/2021  ? Procedure: ULTRASOUND GUIDANCE FOR VASCULAR ACCESS, BILATERAL FEMORAL ARTERIES;  Surgeon: Waynetta Sandy, MD;  Location: [place];  Service: Vascular;  Laterality: Bilateral;  ? VASCULAR SURGERY    ? ?Family History  ?Problem Relation Age of Onset  ? Heart disease Father 14  ?     Living  ? Coronary artery disease Father   ?     CABG  ? Alzheimer's disease Mother 4  ?     Deceased  ? Arthritis Mother   ? Aneurysm Brother   ? Stomach cancer Maternal Uncle   ? Brain cancer Maternal Aunt   ?     x2  ? Obesity Daughter   ?     Had Bypass Sx  ? ?Outpatient Medications Prior to Visit  ?Medication Sig Dispense Refill  ? acetaminophen (TYLENOL) 500 MG tablet Take by mouth.    ? albuterol (VENTOLIN HFA) 108 (90 Base) MCG/ACT inhaler Inhale 2 puffs into the lungs every 6 (six) hours as  needed for wheezing or shortness of breath. 8 g 2  ? alendronate (FOSAMAX) 70 MG tablet Take 1 tablet (70 mg total) by mouth once a week. Take with a full glass of water on an empty stomach. (Patient taking differently: Take 70 mg by mouth every Tuesday. Take with a full glass of water on an empty stomach.) 12 tablet 0  ? allopurinol (ZYLOPRIM) 100 MG tablet TAKE 2 TABLETS(200 MG) BY MOUTH DAILY 180 tablet 0  ? amLODipine (NORVASC) 10 MG tablet Take 10 mg by mouth daily.    ? aspirin EC 81 MG tablet Take 81 mg by mouth daily. Swallow whole.    ? atorvastatin (LIPITOR) 80 MG tablet Take 1 tablet (80 mg total) by mouth daily. 90 tablet 3  ? carvedilol (COREG) 12.5 MG tablet Take 1 tablet (12.5 mg total) by mouth 2 (two) times daily. 60  tablet 6  ? Cholecalciferol 25 MCG (1000 UT) tablet Take by mouth.    ? citalopram (CELEXA) 20 MG tablet TAKE 1 TABLET(20 MG) BY MOUTH DAILY 30 tablet 5  ? clopidogrel (PLAVIX) 75 MG tablet Take 75 mg by

## 2021-06-07 ENCOUNTER — Ambulatory Visit (INDEPENDENT_AMBULATORY_CARE_PROVIDER_SITE_OTHER): Payer: Medicare Other

## 2021-06-07 DIAGNOSIS — Z0000 Encounter for general adult medical examination without abnormal findings: Secondary | ICD-10-CM | POA: Diagnosis not present

## 2021-06-07 NOTE — Patient Instructions (Signed)
Brandon [name] , ?Thank you for taking time to come for your Medicare Wellness Visit. I appreciate your ongoing commitment to your health goals. Please review the following plan we discussed and let me know if I can assist you in the future.  ? ?Screening recommendations/referrals: ?Colonoscopy: no longer required  ?Recommended yearly ophthalmology/optometry visit for glaucoma screening and checkup ?Recommended yearly dental visit for hygiene and checkup ? ?Vaccinations: ?Influenza vaccine: completed  ?Pneumococcal vaccine: completed  ?Tdap vaccine: 01/28/2020 ?Shingles vaccine: will consider    ? ?Advanced directives: yes  ? ?Conditions/risks identified: none  ? ?Next appointment: none  ? ?Preventive Care 79 Years and Older, Male ?Preventive care refers to lifestyle choices and visits with your health care provider that can promote health and wellness. ?What does preventive care include? ?A yearly physical exam. This is also called an annual well check. ?Dental exams once or twice a year. ?Routine eye exams. Ask your health care provider how often you should have your eyes checked. ?Personal lifestyle choices, including: ?Daily care of your teeth and gums. ?Regular physical activity. ?Eating a healthy diet. ?Avoiding tobacco and drug use. ?Limiting alcohol use. ?Practicing safe sex. ?Taking low doses of aspirin every day. ?Taking vitamin and mineral supplements as recommended by your health care provider. ?What happens during an annual well check? ?The services and screenings done by your health care provider during your annual well check will depend on your age, overall health, lifestyle risk factors, and family history of disease. ?Counseling  ?Your health care provider may ask you questions about your: ?Alcohol use. ?Tobacco use. ?Drug use. ?Emotional well-being. ?Home and relationship well-being. ?Sexual activity. ?Eating habits. ?History of falls. ?Memory and ability to understand (cognition). ?Work and work  Statistician. ?Screening  ?You may have the following tests or measurements: ?Height, weight, and BMI. ?Blood pressure. ?Lipid and cholesterol levels. These may be checked every 5 years, or more frequently if you are over 14 years old. ?Skin check. ?Lung cancer screening. You may have this screening every year starting at age 11 if you have a 30-pack-year history of smoking and currently smoke or have quit within the past 15 years. ?Fecal occult blood test (FOBT) of the stool. You may have this test every year starting at age 7. ?Flexible sigmoidoscopy or colonoscopy. You may have a sigmoidoscopy every 5 years or a colonoscopy every 10 years starting at age 79. ?Prostate cancer screening. Recommendations will vary depending on your family history and other risks. ?Hepatitis C blood test. ?Hepatitis B blood test. ?Sexually transmitted disease (STD) testing. ?Diabetes screening. This is done by checking your blood sugar (glucose) after you have not eaten for a while (fasting). You may have this done every 1-3 years. ?Abdominal aortic aneurysm (AAA) screening. You may need this if you are a current or former smoker. ?Osteoporosis. You may be screened starting at age 64 if you are at high risk. ?Talk with your health care provider about your test results, treatment options, and if necessary, the need for more tests. ?Vaccines  ?Your health care provider may recommend certain vaccines, such as: ?Influenza vaccine. This is recommended every year. ?Tetanus, diphtheria, and acellular pertussis (Tdap, Td) vaccine. You may need a Td booster every 10 years. ?Zoster vaccine. You may need this after age 45. ?Pneumococcal 13-valent conjugate (PCV13) vaccine. One dose is recommended after age 31. ?Pneumococcal polysaccharide (PPSV23) vaccine. One dose is recommended after age 55. ?Talk to your health care provider about which screenings and vaccines you need and  how often you need them. ?This information is not intended to replace  advice given to you by your health care provider. Make sure you discuss any questions you have with your health care provider. ?Document Released: 02/18/2015 Document Revised: 10/12/2015 Document Reviewed: 11/23/2014 ?Elsevier Interactive Patient Education ? 2017 [employer]. ? ?Fall Prevention in the Home ?Falls can cause injuries. They can happen to people of all ages. There are many things you can do to make your home safe and to help prevent falls. ?What can I do on the outside of my home? ?Regularly fix the edges of walkways and driveways and fix any cracks. ?Remove anything that might make you trip as you walk through a door, such as a raised step or threshold. ?Trim any bushes or trees on the path to your home. ?Use bright outdoor lighting. ?Clear any walking paths of anything that might make someone trip, such as rocks or tools. ?Regularly check to see if handrails are loose or broken. Make sure that both sides of any steps have handrails. ?Any raised decks and porches should have guardrails on the edges. ?Have any leaves, snow, or ice cleared regularly. ?Use sand or salt on walking paths during winter. ?Clean up any spills in your garage right away. This includes oil or grease spills. ?What can I do in the bathroom? ?Use night lights. ?Install grab bars by the toilet and in the tub and shower. Do not use towel bars as grab bars. ?Use non-skid mats or decals in the tub or shower. ?If you need to sit down in the shower, use a plastic, non-slip stool. ?Keep the floor dry. Clean up any water that spills on the floor as soon as it happens. ?Remove soap buildup in the tub or shower regularly. ?Attach bath mats securely with double-sided non-slip rug tape. ?Do not have throw rugs and other things on the floor that can make you trip. ?What can I do in the bedroom? ?Use night lights. ?Make sure that you have a light by your bed that is easy to reach. ?Do not use any sheets or blankets that are too big for your bed.  They should not hang down onto the floor. ?Have a firm chair that has side arms. You can use this for support while you get dressed. ?Do not have throw rugs and other things on the floor that can make you trip. ?What can I do in the kitchen? ?Clean up any spills right away. ?Avoid walking on wet floors. ?Keep items that you use a lot in easy-to-reach places. ?If you need to reach something above you, use a strong step stool that has a grab bar. ?Keep electrical cords out of the way. ?Do not use floor polish or wax that makes floors slippery. If you must use wax, use non-skid floor wax. ?Do not have throw rugs and other things on the floor that can make you trip. ?What can I do with my stairs? ?Do not leave any items on the stairs. ?Make sure that there are handrails on both sides of the stairs and use them. Fix handrails that are broken or loose. Make sure that handrails are as long as the stairways. ?Check any carpeting to make sure that it is firmly attached to the stairs. Fix any carpet that is loose or worn. ?Avoid having throw rugs at the top or bottom of the stairs. If you do have throw rugs, attach them to the floor with carpet tape. ?Make sure that you have  a light switch at the top of the stairs and the bottom of the stairs. If you do not have them, ask someone to add them for you. ?What else can I do to help prevent falls? ?Wear shoes that: ?Do not have high heels. ?Have rubber bottoms. ?Are comfortable and fit you well. ?Are closed at the toe. Do not wear sandals. ?If you use a stepladder: ?Make sure that it is fully opened. Do not climb a closed stepladder. ?Make sure that both sides of the stepladder are locked into place. ?Ask someone to hold it for you, if possible. ?Clearly mark and make sure that you can see: ?Any grab bars or handrails. ?First and last steps. ?Where the edge of each step is. ?Use tools that help you move around (mobility aids) if they are needed. These  include: ?Canes. ?Walkers. ?Scooters. ?Crutches. ?Turn on the lights when you go into a dark area. Replace any light bulbs as soon as they burn out. ?Set up your furniture so you have a clear path. Avoid moving your furniture around. ?

## 2021-06-07 NOTE — Progress Notes (Signed)
? ?Subjective:  ? Brandon [name] is a 81 y.o. male who presents for an subsequent  Medicare Annual Wellness Visit. ? ? ?I connected with Kohei Antonellis  today by telephone and verified that I am speaking with the correct person using two identifiers. ?Location patient: home ?Location provider: work ?Persons participating in the virtual visit: patient, provider. ?  ?I discussed the limitations, risks, security and privacy concerns of performing an evaluation and management service by telephone and the availability of in person appointments. I also discussed with the patient that there may be a patient responsible charge related to this service. The patient expressed understanding and verbally consented to this telephonic visit.  ?  ?Interactive audio and video telecommunications were attempted between this provider and patient, however failed, due to patient having technical difficulties OR patient did not have access to video capability.  We continued and completed visit with audio only. ? ?  ?Review of Systems    ? ?Cardiac Risk Factors include: advanced age (>50mn, >>80women);hypertension;male gender;dyslipidemia ? ?   ?Objective:  ?  ?Today's Vitals  ? ?There is no height or weight on file to calculate BMI. ? ? ?  06/07/2021  ?  4:02 PM 04/10/2021  ?  8:47 AM 03/31/2021  ? 10:19 AM 02/28/2021  ? 11:44 AM 12/03/2020  ?  8:49 PM 12/03/2020  ?  7:28 AM 06/06/2020  ?  9:30 AM  ?Advanced Directives  ?Does Patient Have a Medical Advance Directive? Yes Yes Yes Yes No No No  ?Type of [place] will Living will Living will Living will     ?Does patient want to make changes to medical advance directive?  No - Patient declined No - Patient declined No - Patient declined No - Patient declined    ?[place]? No - copy requested        ?Would patient like information on creating a medical advance directive?     No - Patient declined No - Guardian declined No -  Patient declined  ? ? ?Current Medications (verified) ?Outpatient Encounter Medications as of 06/07/2021  ?Medication Sig  ? acetaminophen (TYLENOL) 500 MG tablet Take by mouth.  ? albuterol (VENTOLIN HFA) 108 (90 Base) MCG/ACT inhaler Inhale 2 puffs into the lungs every 6 (six) hours as needed for wheezing or shortness of breath.  ? alendronate (FOSAMAX) 70 MG tablet Take 1 tablet (70 mg total) by mouth once a week. Take with a full glass of water on an empty stomach. (Patient taking differently: Take 70 mg by mouth every Tuesday. Take with a full glass of water on an empty stomach.)  ? allopurinol (ZYLOPRIM) 100 MG tablet TAKE 2 TABLETS(200 MG) BY MOUTH DAILY  ? amLODipine (NORVASC) 10 MG tablet Take 10 mg by mouth daily.  ? aspirin EC 81 MG tablet Take 81 mg by mouth daily. Swallow whole.  ? atorvastatin (LIPITOR) 80 MG tablet Take 1 tablet (80 mg total) by mouth daily.  ? carvedilol (COREG) 12.5 MG tablet Take 1 tablet (12.5 mg total) by mouth 2 (two) times daily.  ? Cholecalciferol 25 MCG (1000 UT) tablet Take by mouth.  ? citalopram (CELEXA) 20 MG tablet TAKE 1 TABLET(20 MG) BY MOUTH DAILY  ? clopidogrel (PLAVIX) 75 MG tablet Take 75 mg by mouth daily.  ? doxycycline (VIBRA-TABS) 100 MG tablet Take 1 tablet (100 mg total) by mouth 2 (two) times daily.  ? ezetimibe (ZETIA) 10  MG tablet Take 1 tablet (10 mg total) by mouth daily.  ? fluticasone (FLONASE) 50 MCG/ACT nasal spray SHAKE LIQUID AND USE 2 SPRAYS IN EACH NOSTRIL DAILY AS NEEDED FOR RHINITIS OR ALLERGIES  ? furosemide (LASIX) 20 MG tablet Take 20 mg by mouth daily.  ? gabapentin (NEURONTIN) 600 MG tablet Take 1 tablet (600 mg total) by mouth 2 (two) times daily. X 1 week, then 600 mg TID- for nerve/back pain  ? ipratropium (ATROVENT) 0.03 % nasal spray Place 2 sprays into both nostrils every 12 (twelve) hours. (Patient taking differently: Place 2 sprays into both nostrils 2 (two) times daily as needed for rhinitis.)  ? lisinopril (ZESTRIL) 20 MG tablet  Take 20 mg by mouth daily.  ? Oxycodone HCl 10 MG TABS Take 1 tablet (10 mg total) by mouth every 6 (six) hours as needed.  ? potassium chloride (KLOR-CON) 10 MEQ tablet Take 2 tablets (20 mEq total) by mouth 2 (two) times daily.  ? predniSONE (DELTASONE) 1 MG tablet TAKE 3 TABLETS BY MOUTH DAILY ALONG WITH 5 MG FOR TOTAL OF 8 MG DAILY FOR 2 MONTHS THEN REDUCE TO 7 MG BY MOUTH DAILY  ? predniSONE (DELTASONE) 20 MG tablet Take 2 tablets (40 mg total) by mouth daily with breakfast.  ? predniSONE (DELTASONE) 5 MG tablet Take 1 tablet (5 mg total) by mouth daily with breakfast.  ? pregabalin (LYRICA) 50 MG capsule Take 50 mg by mouth daily.  ? tamsulosin (FLOMAX) 0.4 MG CAPS capsule Take 0.4 mg by mouth in the morning and at bedtime.  ? Tiotropium Bromide Monohydrate (SPIRIVA RESPIMAT) 2.5 MCG/ACT AERS Inhale 2 puffs into the lungs daily. (Patient taking differently: Inhale 2 puffs into the lungs daily as needed (shortness of breath).)  ? traMADol (ULTRAM) 50 MG tablet Take 2 tablets (100 mg total) by mouth 2 (two) times daily.  ? ?No facility-administered encounter medications on file as of 06/07/2021.  ? ? ?Allergies (verified) ?Celebrex [celecoxib] and Tape  ? ?History: ?Past Medical History:  ?Diagnosis Date  ? 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 Arthritis   ? "all over" (07/19/2016)  ? BPH (benign prostatic hypertrophy)   ? CAD (coronary artery disease)   ? Chicken pox   ? Chronic lower back pain   ? s/p surgical fusion  ? Depression   ? Diverticulosis   ? Esophageal stricture   ? GERD (gastroesophageal reflux disease)   ? Hepatitis B 1984  ? Hiatal hernia   ? History of radiation therapy 01/16/18- 03/05/18  ? Left Tonsil, 66 Gy in 33 fractions to high risk nodal echelons.   ? HLD (hyperlipidemia)   ? HTN (hypertension)   ? Liver abscess 07/10/2016  ? Osteoarthritis   ? 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 s/p TAVR with a 29 mm Edwards Sapien 3 via the subclavian approach by Dr. Burt Knack and Dr Cyndia Bent    ? Severe aortic stenosis   ? TIA (transient ischemic attack) 1990s  ? hx  ? tonsillar ca dx'd 11/2017  ? Tubular adenoma of colon 2009  ? ?Past Surgical History:  ?Procedure Laterality Date  ? ABDOMINAL AORTIC ENDOVASCULAR STENT GRAFT N/A 03/03/2021  ? Procedure: ABDOMINAL AORTIC ENDOVASCULAR STENT GRAFT;  Surgeon: Waynetta Sandy, MD;  Location: [facility];  Service: Vascular;  Laterality: N/A;  ? ABDOMINAL AORTOGRAM W/LOWER EXTREMITY N/A 04/10/2021  ? Procedure: ABDOMINAL AORTOGRAM W/LOWER EXTREMITY;  Surgeon: Waynetta Sandy, MD;  Location: [facility];  Service: Cardiovascular;  Laterality: N/A;  ? BACK SURGERY    ? CARDIAC CATHETERIZATION    ? CATARACT EXTRACTION W/ INTRAOCULAR LENS  IMPLANT, BILATERAL Bilateral 01/2012 - 02/2012  ? CORONARY ATHERECTOMY N/A 04/27/2020  ? Procedure: CORONARY ATHERECTOMY;  Surgeon: Sherren Mocha, MD;  Location: [facility];  Service: Cardiovascular;  Laterality: N/A;  ? CORONARY STENT INTERVENTION N/A 04/27/2020  ? Procedure: CORONARY STENT INTERVENTION;  Surgeon: Sherren Mocha, MD;  Location: [facility];  Service: Cardiovascular;  Laterality: N/A;  ? DIRECT LARYNGOSCOPY Left 10/09/2017  ? Procedure: DIRECT LARYNGOSCOPY WITH BOPSY;  Surgeon: Jodi Marble, MD;  Location: [facility];  Service: ENT;  Laterality: Left;  ? ESOPHAGOGASTRODUODENOSCOPY (EGD) WITH ESOPHAGEAL DILATION    ? "couple times" (07/19/2016)  ? ESOPHAGOSCOPY Left 10/09/2017  ? Procedure: ESOPHAGOSCOPY;  Surgeon: Jodi Marble, MD;  Location: [facility];  Service: ENT;  Laterality: Left;  ? INTRAVASCULAR IMAGING/OCT N/A 04/27/2020  ? Procedure: INTRAVASCULAR IMAGING/OCT;  Surgeon: Sherren Mocha, MD;  Location: [facility];  Service: Cardiovascular;  Laterality: N/A;  ? INTRAVASCULAR PRESSURE WIRE/FFR STUDY N/A 04/27/2020  ? Procedure: INTRAVASCULAR PRESSURE WIRE/FFR STUDY;  Surgeon: Sherren Mocha, MD;  Location: [facility];   Service: Cardiovascular;  Laterality: N/A;  ? IR GASTROSTOMY TUBE MOD SED  01/08/2018  ? IR THORACENTESIS ASP PLEURAL SPACE W/IMG GUIDE  07/19/2016  ? [place]  ? LUMBAR DISC

## 2021-06-08 ENCOUNTER — Telehealth: Payer: Self-pay | Admitting: Family Medicine

## 2021-06-08 ENCOUNTER — Telehealth: Payer: Self-pay

## 2021-06-08 DIAGNOSIS — M5416 Radiculopathy, lumbar region: Secondary | ICD-10-CM | POA: Insufficient documentation

## 2021-06-08 NOTE — Telephone Encounter (Signed)
Returned call to patient , left voice mail explaining he will need to call his BC/BS insurance to notify them he has completed the Medicare wellness and submit the AVS to BC/BS. The promotion for a gift card not being offered through [facility] health. ? ?L.Vannak Montenegro,LPN ?

## 2021-06-08 NOTE — Telephone Encounter (Signed)
Pt came for his $50 gift card he said to please mail it to him. ? ?

## 2021-06-20 ENCOUNTER — Encounter: Payer: Self-pay | Admitting: Family Medicine

## 2021-06-20 ENCOUNTER — Ambulatory Visit: Payer: Medicare Other | Admitting: Family Medicine

## 2021-06-20 ENCOUNTER — Ambulatory Visit (INDEPENDENT_AMBULATORY_CARE_PROVIDER_SITE_OTHER): Payer: Medicare Other | Admitting: Family Medicine

## 2021-06-20 VITALS — BP 134/70 | HR 111 | Temp 96.9°F | Ht 66.5 in | Wt 165.8 lb

## 2021-06-20 DIAGNOSIS — R1010 Upper abdominal pain, unspecified: Secondary | ICD-10-CM | POA: Diagnosis not present

## 2021-06-20 DIAGNOSIS — J449 Chronic obstructive pulmonary disease, unspecified: Secondary | ICD-10-CM | POA: Diagnosis not present

## 2021-06-20 DIAGNOSIS — I5032 Chronic diastolic (congestive) heart failure: Secondary | ICD-10-CM | POA: Diagnosis not present

## 2021-06-20 MED ORDER — ONDANSETRON 4 MG PO TBDP: 4.0000 mg | ORAL_TABLET | Freq: Three times a day (TID) | ORAL | 0 refills | Status: DC | PRN

## 2021-06-20 NOTE — Progress Notes (Signed)
?[facility] ?[facility] ?[place] ?[place] [address] ?Dept: [phone] ?Dept Fax: [phone] ? ?Office Visit ? ?Subjective:  ? ? Patient ID: Brandon [name], male    DOB: July 18, 1940, 81 y.o..   MRN: [id number] ? ?Chief Complaint  ?Patient presents with  ? Acute Visit  ?  C/o having Nausea and diarrhea x 3 days. Diarrhea has subsided.  Has been using Tums and Prevacid.     ? ? ?History of Present Illness: ? ?Patient is in today for evaluation of an acute episode of nausea, vomiting, diarrhea and diffuse abdominal pain. [name] notes that he has been unable to eat since Saturday. He states the vomiting and diarrhea have now resolved, however, he continues ot feel some nausea whenever he thinks about eating. He denies any hematemesis, hematochezia, or melena. 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 He denies any fever. He feels his cough has improved back to his baseline. ? ?Past Medical History: ?Patient Active Problem List  ? Diagnosis Date Noted  ? Lumbar postlaminectomy syndrome 05/03/2021  ? Degeneration of lumbar intervertebral disc 05/03/2021  ? Lumbar spondylosis 05/03/2021  ? History of hip fracture 04/24/2021  ? S/P abdominal aortic aneurysm repair 04/24/2021  ? Chronic allergic rhinitis 01/24/2021  ? Demand ischemia ([place])   ? Acute on chronic respiratory failure with hypoxia (HCC)   ? Elevated troponin   ? Chronic diastolic heart failure ([place]) 12/03/2020  ? Steatosis of liver 11/01/2020  ? 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 Hepatitis B core antibody positive 11/01/2020  ? Benign prostatic hyperplasia 11/01/2020  ? Chronic obstructive pulmonary disease ([place]) 07/20/2020  ? History of placement of stent in LAD coronary artery 07/20/2020  ? 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  ? TIA (transient ischemic attack)   ? Esophageal stricture   ? 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 Primary osteoarthritis of both hands 01/11/2020  ? Piriformis syndrome of both sides 09/23/2019  ? Pain of both shoulder joints 07/29/2019  ? Sciatica associated with disorder of lumbar spine 04/24/2019  ? Primary osteoarthritis involving multiple joints 04/24/2019  ? Chronic pain syndrome 04/24/2019  ? Anxiety and depression 04/15/2018  ? Cancer of tonsillar fossa ([place]) 09/20/2017  ? Liver abscess 07/10/2016  ? Hypochromic anemia 07/10/2016  ? Essential hypertension 05/19/2013  ? Osteoarthritis 05/29/2009  ? Psoriasis 01/13/2009  ? Coronary artery disease 09/15/2008  ? Diverticulosis of colon 08/27/2007  ? History of benign prostatic hyperplasia 08/27/2007  ? Hyperlipidemia 03/19/2007  ? Gastroesophageal reflux disease 03/19/2007  ? ?Past Surgical History:  ?Procedure Laterality Date  ? ABDOMINAL AORTIC ENDOVASCULAR STENT GRAFT N/A 03/03/2021  ? Procedure: ABDOMINAL AORTIC ENDOVASCULAR STENT GRAFT;  Surgeon: Waynetta Sandy, MD;  Location: [facility];  Service: Vascular;  Laterality: N/A;  ? ABDOMINAL AORTOGRAM W/LOWER EXTREMITY N/A 04/10/2021  ? Procedure: ABDOMINAL AORTOGRAM W/LOWER EXTREMITY;  Surgeon: Waynetta Sandy, MD;  Location: [facility];  Service: Cardiovascular;  Laterality: N/A;  ? BACK SURGERY    ? CARDIAC CATHETERIZATION    ? CATARACT EXTRACTION W/ INTRAOCULAR LENS  IMPLANT, BILATERAL Bilateral 01/2012 - 02/2012  ? CORONARY ATHERECTOMY N/A 04/27/2020  ? Procedure: CORONARY ATHERECTOMY;  Surgeon: Sherren Mocha, MD;  Location: [facility];  Service: Cardiovascular;  Laterality: N/A;  ? CORONARY STENT INTERVENTION N/A 04/27/2020  ? Procedure: CORONARY STENT INTERVENTION;  Surgeon: Sherren Mocha, MD;  Location: [facility]  INVASIVE CV LAB;  Service: Cardiovascular;  Laterality: N/A;  ? DIRECT LARYNGOSCOPY Left 10/09/2017  ? Procedure: DIRECT LARYNGOSCOPY WITH BOPSY;  Surgeon: Jodi Marble, MD;  Location: [facility];  Service: ENT;   Laterality: Left;  ? ESOPHAGOGASTRODUODENOSCOPY (EGD) WITH ESOPHAGEAL DILATION    ? "couple times" (07/19/2016)  ? ESOPHAGOSCOPY Left 10/09/2017  ? Procedure: ESOPHAGOSCOPY;  Surgeon: Jodi Marble, MD;  Location: [facility];  Service: ENT;  Laterality: Left;  ? INTRAVASCULAR IMAGING/OCT N/A 04/27/2020  ? Procedure: INTRAVASCULAR IMAGING/OCT;  Surgeon: Sherren Mocha, MD;  Location: [facility];  Service: Cardiovascular;  Laterality: N/A;  ? INTRAVASCULAR PRESSURE WIRE/FFR STUDY N/A 04/27/2020  ? Procedure: INTRAVASCULAR PRESSURE WIRE/FFR STUDY;  Surgeon: Sherren Mocha, MD;  Location: [facility];  Service: Cardiovascular;  Laterality: N/A;  ? IR GASTROSTOMY TUBE MOD SED  01/08/2018  ? IR THORACENTESIS ASP PLEURAL SPACE W/IMG GUIDE  07/19/2016  ? [place]  ? [place] [facility]  05/1996  ? L4-5; Dr. Sherwood Gambler   ? LUMBAR LAMINECTOMY/DECOMPRESSION MICRODISCECTOMY  10/2002  ? L3-4. Dr. Sherwood Gambler  ? MULTIPLE TOOTH EXTRACTIONS  1980s  ? PARTIAL GLOSSECTOMY  12/02/2017  ? Dr. Nicolette Bang- [facility]  ? pharyngoplasty for closure of tingue base defect  12/02/2017  ? Dr. [facility]icolette [facility]- [facility]  ? PICC LINE INSERTION  07/15/2016  ? POSTERIOR LUMBAR FUSION  09/1996  ? Ray cage, L4-5 Dr. Rita Ohara  ? PROSTATE BIOPSY  ~ 2017  ? radical tonsillectomy Left 12/02/2017  ? Dr. Nicolette Bang at [facility]  ? RIGHT HEART CATH AND CORONARY ANGIOGRAPHY N/A 03/31/2020  ? Procedure: RIGHT HEART CATH AND CORONARY ANGIOGRAPHY;  Surgeon: Larey Dresser, MD;  Location: [facility];  Service: Cardiovascular;  Laterality: N/A;  ? RIGID BRONCHOSCOPY Left 10/09/2017  ? Procedure: RIGID BRONCHOSCOPY;  Surgeon: Jodi Marble, MD;  Location: [facility];  Service: ENT;  Laterality: Left;  ? TEE WITHOUT CARDIOVERSION N/A 06/07/2020  ? Procedure: TRANSESOPHAGEAL ECHOCARDIOGRAM (TEE);  Surgeon: Sherren Mocha, MD;  Location: [facility];  Service: Open Heart Surgery;  Laterality: N/A;  ? TONSILLECTOMY    ?  TOTAL HIP ARTHROPLASTY Left 12/04/2020  ? Procedure: TOTAL HIP ARTHROPLASTY ANTERIOR APPROACH;  Surgeon: Rod Can, MD;  Location: [facility];  Service: Orthopedics;  Laterality: Left;  ? TOTAL HIP ARTHROPLASTY Left 11/2020  ? TRACHEOSTOMY  12/02/2017  ? Dr. Nicolette Bang- [facility]  ? ULTRASOUND GUIDANCE FOR VASCULAR ACCESS Right 06/07/2020  ? Procedure: ULTRASOUND GUIDANCE FOR VASCULAR ACCESS;  Surgeon: Sherren Mocha, MD;  Location: [facility];  Service: Open Heart Surgery;  Laterality: Right;  ? ULTRASOUND GUIDANCE FOR VASCULAR ACCESS Bilateral 03/03/2021  ? Procedure: ULTRASOUND GUIDANCE FOR VASCULAR ACCESS, BILATERAL FEMORAL ARTERIES;  Surgeon: Waynetta Sandy, MD;  Location: [facility];  Service: Vascular;  Laterality: Bilateral;  ? VASCULAR SURGERY    ? ?Family History  ?Problem Relation Age of Onset  ? Heart disease Father 61  ?     Living  ? Coronary artery disease Father   ?     CABG  ? Alzheimer's disease Mother 85  ?     Deceased  ? Arthritis Mother   ? Aneurysm Brother   ? Stomach cancer Maternal Uncle   ? Brain cancer Maternal Aunt   ?     x2  ? Obesity Daughter   ?     Had Bypass Sx  ? ?Outpatient Medications Prior to Visit  ?Medication Sig Dispense Refill  ? acetaminophen (TYLENOL)  500 MG tablet Take by mouth.    ? albuterol (VENTOLIN HFA) 108 (90 Base) MCG/ACT inhaler Inhale 2 puffs into the lungs every 6 (six) hours as needed for wheezing or shortness of breath. 8 g 2  ? alendronate (FOSAMAX) 70 MG tablet Take 1 tablet (70 mg total) by mouth once a week. Take with a full glass of water on an empty stomach. (Patient taking differently: Take 70 mg by mouth every Tuesday. Take with a full glass of water on an empty stomach.) 12 tablet 0  ? allopurinol (ZYLOPRIM) 100 MG tablet TAKE 2 TABLETS(200 MG) BY MOUTH DAILY 180 tablet 0  ? amLODipine (NORVASC) 10 MG tablet Take 10 mg by mouth daily.    ? aspirin EC 81 MG tablet Take 81 mg by mouth daily. Swallow whole.    ? atorvastatin (LIPITOR) 80 MG tablet Take 1  tablet (80 mg total) by mouth daily. 90 tablet 3  ? carvedilol (COREG) 12.5 MG tablet Take 1 tablet (12.5 mg total) by mouth 2 (two) times daily. 60 tablet 6  ? Cholecalciferol 25 MCG (1000 UT) tablet Take by m

## 2021-06-21 LAB — URINALYSIS W MICROSCOPIC + REFLEX CULTURE
Bacteria, UA: NONE SEEN /HPF
Bilirubin Urine: NEGATIVE
Glucose, UA: NEGATIVE
Hgb urine dipstick: NEGATIVE
Ketones, ur: NEGATIVE
Leukocyte Esterase: NEGATIVE
Nitrites, Initial: NEGATIVE
RBC / HPF: NONE SEEN /HPF (ref 0–2)
Specific Gravity, Urine: 1.018 (ref 1.001–1.035)
Squamous Epithelial / HPF: NONE SEEN /HPF
pH: <=5 (ref 5.0–8.0)

## 2021-06-21 LAB — CBC WITH DIFFERENTIAL/PLATELET
Basophils Absolute: 0.1 K/uL (ref 0.0–0.1)
Basophils Relative: 0.6 % (ref 0.0–3.0)
Eosinophils Absolute: 0.2 K/uL (ref 0.0–0.7)
Eosinophils Relative: 2 % (ref 0.0–5.0)
HCT: 37.4 % — ABNORMAL LOW (ref 39.0–52.0)
Hemoglobin: 11.8 g/dL — ABNORMAL LOW (ref 13.0–17.0)
Lymphocytes Relative: 11.1 % — ABNORMAL LOW (ref 12.0–46.0)
Lymphs Abs: 1 K/uL (ref 0.7–4.0)
MCHC: 31.6 g/dL (ref 30.0–36.0)
MCV: 77.2 fl — ABNORMAL LOW (ref 78.0–100.0)
Monocytes Absolute: 0.7 K/uL (ref 0.1–1.0)
Monocytes Relative: 8.4 % (ref 3.0–12.0)
Neutro Abs: 6.8 K/uL (ref 1.4–7.7)
Neutrophils Relative %: 77.9 % — ABNORMAL HIGH (ref 43.0–77.0)
Platelets: 180 K/uL (ref 150.0–400.0)
RBC: 4.85 Mil/uL (ref 4.22–5.81)
RDW: 19 % — ABNORMAL HIGH (ref 11.5–15.5)
WBC: 8.7 K/uL (ref 4.0–10.5)

## 2021-06-21 LAB — COMPREHENSIVE METABOLIC PANEL WITH GFR
ALT: 17 U/L (ref 0–53)
AST: 17 U/L (ref 0–37)
Albumin: 4.3 g/dL (ref 3.5–5.2)
Alkaline Phosphatase: 123 U/L — ABNORMAL HIGH (ref 39–117)
BUN: 41 mg/dL — ABNORMAL HIGH (ref 6–23)
CO2: 25 meq/L (ref 19–32)
Calcium: 10 mg/dL (ref 8.4–10.5)
Chloride: 97 meq/L (ref 96–112)
Creatinine, Ser: 1.35 mg/dL (ref 0.40–1.50)
GFR: 50.22 mL/min — ABNORMAL LOW
Glucose, Bld: 143 mg/dL — ABNORMAL HIGH (ref 70–99)
Potassium: 4.5 meq/L (ref 3.5–5.1)
Sodium: 134 meq/L — ABNORMAL LOW (ref 135–145)
Total Bilirubin: 0.5 mg/dL (ref 0.2–1.2)
Total Protein: 7.7 g/dL (ref 6.0–8.3)

## 2021-06-21 LAB — NO CULTURE INDICATED

## 2021-06-21 LAB — LIPASE: Lipase: 10 U/L — ABNORMAL LOW (ref 11.0–59.0)

## 2021-06-22 ENCOUNTER — Telehealth: Payer: Self-pay | Admitting: Family Medicine

## 2021-06-22 ENCOUNTER — Other Ambulatory Visit: Payer: Self-pay | Admitting: Physician Assistant

## 2021-06-22 DIAGNOSIS — L299 Pruritus, unspecified: Secondary | ICD-10-CM

## 2021-06-22 NOTE — Telephone Encounter (Signed)
Please review and advise. Thanks. Dm/cma  

## 2021-06-22 NOTE — Telephone Encounter (Signed)
Caller Name: Cordell Guercio Call back phone #: [phone]  MEDICATION(S): a script for itching, he does not the name. It's for his Eczema, he couldn't sleep at all last night.    Days of Med Remaining: none  Has the patient contacted their pharmacy (YES/NO)?  Yes contact your pcp IF YES, when and what did the pharmacy advise?  IF NO, request that the patient contact the pharmacy for the refills in the future.             The pharmacy will send an electronic request (except for controlled medications).  Preferred Pharmacy: [facility] [place], [place] - [address]  [place], [place] [address]  Phone:  [phone]  Fax:  [phone]  DEA #:  [id number]  ~~~Please advise patient/caregiver to allow 2-3 business days to process RX refills.

## 2021-06-22 NOTE — Progress Notes (Deleted)
HEART AND [place]                                     Cardiology Office Note:    Date:  06/22/2021   ID:  Brandon [name], DOB September 12, 1940, MRN [id number]  PCP:  Haydee Salter, MD  [facility] Cardiologist:  Loralie Champagne, MD / Dr. Burt Knack & Dr. Cyndia Bent (TAVR)  [facility] Electrophysiologist:  None   Referring MD: Haydee Salter, MD   1 year s/p TAVR  History of Present Illness:    Brandon [name] is a 81 y.o. 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   The patient has been followed for aortic stenosis with surveillance echo studies and close clinical care by Dr. Aundra Dubin.  He has developed progressive and now severe aortic stenosis with mean gradient of 50 mmHg and calculated valve area of 0.96 cm.  LVEF remains preserved at 60 to 65%.  He underwent cardiac catheterization for preoperative assessment and was demonstrated to have chronic occlusion of the right coronary artery with left-to-right collaterals, severe calcific proximal LAD stenosis, and moderately severe long segment mid circumflex stenosis. The patient reported exertional chest pain and dyspnea. He was referred to the multidisciplinary valve team for consideration of SAVR+CABG or TAVR+PCI. Cardiac gated CTA of the heart revealed anatomical characteristics consistent with aortic stenosis suitable for treatment by transcatheter aortic valve replacement without any significant complicating features. CTA of the aorta and iliac vessels demonstrated a large  infrarenal abdominal aortic aneurysm which is increasing in size, currently measuring 5.0x 4.2 cm in diameter (previously 4.3 cm on 02/26/2017). Seen by Dr. Donzetta Matters with vascular who recommended repeat CT scan in 5-6 months and proceed with TAVR. He felt he may be a candidate for endovascular repair in the future.       He was evaluated by the multidisciplinary valve team inlcuding Dr. Burt Knack and Dr Cyndia Bent who felt PCI+TAVR via the subclavian approach was the most appropriate course of action for the patient. He underwent successful orbital atherectomy and stenting of severe calcific stenosis in the proximal LAD, procedure guided by OCT imaging, treated with a 4.0 x 12 mm resolute Onyx DES on 04/27/20. There was moderate left circumflex stenosis with negative pressure wire analysis. He was continued on uninterrupted DAPT with aspirin and Plavix x 6 months.     He underwent successful TAVR with a 29 mm Edwards Sapien 3 THV via the subclavian approach on 06/07/20. Post operative echo showed EF 60%, normally functioning TAVR with a mean gradient of 8 mmHg and no PVL. He was continued on DAPT with aspirin and plavix. 1 month echo showed EF 70%, normally functioning TAVR with a mean gradient of 16 mm hg and mild PVL. Gradient has increased since last echo (up for 8 mm hg with an estimated EF of 60-65%). Repeat echo 3 months later 11/07/20 showed stable mean gradient of 16 mm hg.   She was admitted 11/2020 for a displaced  left femoral neck fracture secondary to mechanical fall and underwent total hip replacement. Repeat echo showed stable gradients.   He underwent endovascular abdominal aortic aneurysm repair with Dr. Donzetta Matters on 03/03/21. He notes that it was discovered afterwards that he did not have good flow through the left iliac. He had a repeat catheterization on 3/6 with endovascular aneurysm repair of the left iliac artery.   Today he presents to clinic for follow up.    Past Medical History:  Diagnosis Date    Abdominal aneurysm ([place])    Arthritis    "all over" (07/19/2016)   BPH (benign prostatic hypertrophy)    CAD (coronary artery disease)    Chicken pox    Chronic lower back pain    s/p surgical fusion   Depression    Diverticulosis    Esophageal stricture    GERD (gastroesophageal reflux disease)    Hepatitis B 1984   Hiatal hernia    History of radiation therapy 01/16/18- 03/05/18   Left Tonsil, 66 Gy in 33 fractions to high risk nodal echelons.    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 Burt Knack and Dr Cyndia Bent    Severe aortic stenosis    TIA (transient ischemic attack) 1990s   hx   tonsillar ca dx'd 11/2017   Tubular adenoma of colon 2009    Past Surgical History:  Procedure Laterality Date   ABDOMINAL AORTIC ENDOVASCULAR STENT GRAFT N/A 03/03/2021   Procedure: ABDOMINAL AORTIC ENDOVASCULAR STENT GRAFT;  Surgeon: Waynetta Sandy, MD;  Location: [place];  Service: Vascular;  Laterality: N/A;   ABDOMINAL AORTOGRAM W/LOWER EXTREMITY N/A 04/10/2021   Procedure: ABDOMINAL AORTOGRAM W/LOWER EXTREMITY;  Surgeon: Waynetta Sandy, MD;  Location: [place] [facility];  Service: Cardiovascular;  Laterality: N/A;   BACK SURGERY     CARDIAC CATHETERIZATION     CATARACT EXTRACTION W/ INTRAOCULAR LENS  IMPLANT, BILATERAL Bilateral 01/2012 - 02/2012   CORONARY ATHERECTOMY N/A 04/27/2020   Procedure: CORONARY ATHERECTOMY;  Surgeon: Sherren Mocha, MD;  Location: [place] [facility];  Service: Cardiovascular;  Laterality: N/A;   CORONARY STENT INTERVENTION N/A 04/27/2020   Procedure: CORONARY STENT INTERVENTION;  Surgeon: Sherren Mocha, MD;  Location: [place] [facility];  Service: Cardiovascular;  Laterality: N/A;   DIRECT LARYNGOSCOPY Left 10/09/2017   Procedure: DIRECT LARYNGOSCOPY WITH BOPSY;   Surgeon: Jodi Marble, MD;  Location: [place];  Service: ENT;  Laterality: Left;   ESOPHAGOGASTRODUODENOSCOPY (EGD) WITH ESOPHAGEAL DILATION     "couple times" (07/19/2016)   ESOPHAGOSCOPY Left 10/09/2017   Procedure: ESOPHAGOSCOPY;  Surgeon: Jodi Marble, MD;  Location: [place];  Service: ENT;  Laterality: Left;   INTRAVASCULAR IMAGING/OCT N/A 04/27/2020   Procedure: INTRAVASCULAR IMAGING/OCT;  Surgeon: Sherren Mocha, MD;  Location: [place] [facility];  Service: Cardiovascular;  Laterality: N/A;   INTRAVASCULAR PRESSURE WIRE/FFR STUDY N/A 04/27/2020   Procedure: INTRAVASCULAR PRESSURE WIRE/FFR STUDY;  Surgeon: Sherren Mocha, MD;  Location: [place] [facility];  Service: Cardiovascular;  Laterality: N/A;   IR GASTROSTOMY TUBE MOD SED  01/08/2018   IR THORACENTESIS ASP PLEURAL SPACE W/IMG GUIDE  07/19/2016   LAPAROSCOPIC CHOLECYSTECTOMY  1984   LUMBAR [place] SURGERY  05/1996   L4-5; Dr. Claudean Kinds LAMINECTOMY/DECOMPRESSION MICRODISCECTOMY  10/2002   L3-4. Dr. Sherwood Gambler   MULTIPLE TOOTH EXTRACTIONS  1980s   PARTIAL GLOSSECTOMY  12/02/2017   Dr. Nicolette Bang- [facility]   pharyngoplasty for closure of tingue base defect  12/02/2017   Dr. Nicolette Bang- [place]  07/15/2016   POSTERIOR LUMBAR FUSION  09/1996   Ray cage, L4-5 Dr. Rita Ohara   PROSTATE BIOPSY  ~ 2017   radical tonsillectomy Left 12/02/2017   Dr. Nicolette Bang at [place] N/A 03/31/2020   Procedure: RIGHT HEART CATH AND CORONARY ANGIOGRAPHY;  Surgeon: Larey Dresser, MD;  Location: [place] [facility];  Service: Cardiovascular;  Laterality: N/A;   RIGID BRONCHOSCOPY Left 10/09/2017   Procedure: RIGID BRONCHOSCOPY;  Surgeon: Jodi Marble, MD;  Location: [place];  Service: ENT;  Laterality: Left;   TEE WITHOUT CARDIOVERSION N/A 06/07/2020   Procedure: TRANSESOPHAGEAL ECHOCARDIOGRAM (TEE);  Surgeon: Sherren Mocha, MD;   Location: [place];  Service: Open Heart Surgery;  Laterality: N/A;   TONSILLECTOMY     TOTAL HIP ARTHROPLASTY Left 12/04/2020   Procedure: TOTAL HIP ARTHROPLASTY ANTERIOR APPROACH;  Surgeon: Rod Can, MD;  Location: [facility];  Service: Orthopedics;  Laterality: Left;   TOTAL HIP ARTHROPLASTY Left 11/2020   TRACHEOSTOMY  12/02/2017   Dr. [facility]icolette [facility]- [facility]   ULTRASOUND GUIDANCE FOR VASCULAR ACCESS Right 06/07/2020   Procedure: ULTRASOUND GUIDANCE FOR VASCULAR ACCESS;  Surgeon: Sherren Mocha, MD;  Location: [place];  Service: Open Heart Surgery;  Laterality: Right;   ULTRASOUND GUIDANCE FOR VASCULAR ACCESS Bilateral 03/03/2021   Procedure: ULTRASOUND GUIDANCE FOR VASCULAR ACCESS, BILATERAL FEMORAL ARTERIES;  Surgeon: Waynetta Sandy, MD;  Location: [place];  Service: Vascular;  Laterality: Bilateral;   VASCULAR SURGERY      Current Medications: No outpatient medications have been marked as taking for the 06/23/21 encounter (Appointment) with [facility].     Allergies:   Celebrex [celecoxib] and Tape   Social History   Socioeconomic History   Marital status: Married    Spouse name: etta   Number of children: 2   Years of education: Not on file   Highest education level: Not on file  Occupational History   Occupation: retired    Fish farm manager: RETIRED    Comment: disabled due to back problems  Tobacco Use   Smoking status: Former    Packs/day: 1.50    Years: 50.00    Pack years: 75.00    Types: Cigarettes    Quit date: 09/05/2017    Years since quitting: 3.7   Smokeless tobacco: Never   Tobacco comments:    smoked less than 1 ppd for 40+ years;   Vaping Use   Vaping Use: Never used  Substance and Sexual Activity   Alcohol use: Yes    Alcohol/week: 0.0 standard drinks    Comment: Occasionally,not a heavy drinker    Drug use: Yes    Types: Oxycodone    Comment: has prescription   Sexual activity: Not Currently  Other Topics Concern   Not on file   Social History Narrative   ** Merged History Encounter **       Married (3rd), [place]. 2 children from 1st marriage, 4 step children.    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       Cell # [phone]   Social Determinants of Health  Financial Resource Strain: Low Risk    Difficulty of Paying Living Expenses: Not hard at all  Food Insecurity: No Food Insecurity   Worried About Charity fundraiser in the Last Year: Never true   Ran Out of Food in the Last Year: Never true  Transportation Needs: No Transportation Needs   Lack of Transportation (Medical): No   Lack of Transportation (Non-Medical): No  Physical Activity: Inactive   Days of Exercise per Week: 0 days   Minutes of Exercise per Session: 0 min  Stress: No Stress Concern Present   Feeling of Stress : Not at all  Social Connections: Moderately Integrated   Frequency of Communication with Friends and Family: Twice a week   Frequency of Social Gatherings with Friends and Family: Twice a week   Attends Religious Services: More than 4 times per year   Active Member of [employer] or Organizations: No   Attends Archivist Meetings: Never   Marital Status: Married     Family History: The patient's family history includes Alzheimer's disease (age of onset: 21) in his mother; Aneurysm in his brother; Arthritis in his mother; Brain cancer in his maternal aunt; Coronary artery disease in his father; Heart disease (age of onset: 87) in his father; Obesity in his daughter; Stomach cancer in his maternal uncle.  ROS:   Please see the history of present illness.    All other systems reviewed and are negative.  EKGs/Labs/Other Studies Reviewed:    The following studies were reviewed today:   TAVR OPERATIVE NOTE     Date of Procedure:                06/07/2020   Preoperative Diagnosis:      Severe Aortic Stenosis   Postoperative  Diagnosis:    Same   Procedure:        Transcatheter Aortic Valve Replacement - Left Subclavian Artery Approach             Edwards Sapien 3 THV (size 29 mm, model # 9600TFX, serial # [id number])              Co-Surgeons:                        Gaye Pollack, MD and Sherren Mocha, MD   Anesthesiologist:                  Roberts Gaudy, MD   Echocardiographer:              Jenkins Rouge, MD   Pre-operative Echo Findings: Severe aortic stenosis, Mild aortic insufficiency Normal left ventricular systolic function   Post-operative Echo Findings: Trivial paravalvular leak Normal left ventricular systolic function   _____________   Echo 06/08/20: IMPRESSIONS   1. Left ventricular ejection fraction, by estimation, is 60 to 65%. The  left ventricle has normal function. The left ventricle has no regional  wall motion abnormalities. There is severe asymmetric left ventricular  hypertrophy of the basal and septal segments. Left ventricular diastolic parameters were normal.   2. Right ventricular systolic function is normal. The right ventricular  size is normal.   3. Left atrial size was moderately dilated.   4. The mitral valve is abnormal. Mild mitral valve regurgitation. No  evidence of mitral stenosis.   5. Post TAVR with 29 mm Sapien 3 valve no significant PVL mean gradient 8 peak 17 mmHg AVA 5.6 cm2 DVI 0.91 . The aortic  valve is normal in  structure. Aortic valve regurgitation is not visualized. No aortic  stenosis is present.   6. Aortic dilatation noted. There is mild dilatation of the ascending  aorta, measuring 41 mm.   7. The inferior vena cava is normal in size with greater than 50%  respiratory variability, suggesting right atrial pressure of 3 mmHg.  _________________________  Echo 08/10/20 IMPRESSIONS   1. 29 mm S3 in the aortic position. Vmax 2.6 m/s, MG 16 mmHG, EOA 2.90 cm2, DI 0.51. There is mild paravalvular leak in the 5-7 o'clock position. Gradient has increased  since last echo. Higher gradient could be related to hyperdynamic LV function, but would consider gated CTA to exclude HALT. The aortic valve has been repaired/replaced. Aortic valve regurgitation is mild. There is a 29 mm Sapien prosthetic (TAVR) valve present in the aortic position. Procedure Date: 06/07/2020.   2. Hyperdynamic LV with intracavitary gradient noted up to 16 mmHG. Left  ventricular ejection fraction, by estimation, is 70 to 75%. The left  ventricle has hyperdynamic function. The left ventricle has no regional  wall motion abnormalities. There is moderate concentric left ventricular hypertrophy. Left ventricular diastolic parameters are consistent with Grade II diastolic dysfunction (pseudonormalization). Elevated left ventricular end-diastolic pressure.  The E/e' is 20.7. The average left ventricular global longitudinal strain is -20.2 %. The global longitudinal strain is normal.   3. Right ventricular systolic function is normal. The right ventricular  size is normal. Tricuspid regurgitation signal is inadequate for assessing  PA pressure.   4. Left atrial size was moderately dilated.   5. The mitral valve is grossly normal. No evidence of mitral valve  regurgitation. No evidence of mitral stenosis.   6. Aortic dilatation noted. There is mild dilatation of the aortic root  and of the ascending aorta, measuring 40 mm.   7. The inferior vena cava is normal in size with greater than 50%  respiratory variability, suggesting right atrial pressure of 3 mmHg.   Comparison(s): Changes from prior study are noted. Hyperdynamic LV  function. Gradients across TAVR have increased as described above.   ____________________  Echo 06/23/21 ***   EKG:  EKG is NOT ordered today.   Recent Labs: 12/26/2020: B Natriuretic Peptide 103.8 03/03/2021: Magnesium 1.8 03/31/2021: TSH 2.348 06/20/2021: ALT 17; BUN 41; Creatinine, Ser 1.35; Hemoglobin 11.8; Platelets 180.0; Potassium 4.5; Sodium 134   Recent Lipid Panel    Component Value Date/Time   CHOL 156 07/26/2020 1130   TRIG 61 07/26/2020 1130   HDL 67 07/26/2020 1130   CHOLHDL 2.3 07/26/2020 1130   VLDL 12 07/26/2020 1130   LDLCALC 77 07/26/2020 1130   LDLDIRECT 81.2 05/25/2008 0906     Risk Assessment/Calculations:       Physical Exam:    VS:  There were no vitals taken for this visit.    Wt Readings from Last 3 Encounters:  06/20/21 165 lb 12.8 oz (75.2 kg)  06/06/21 178 lb 3.2 oz (80.8 kg)  06/02/21 179 lb (81.2 kg)     GEN:  Well nourished, well developed in no acute distress HEENT: Normal NECK: No JVD; No carotid bruits LYMPHATICS: No lymphadenopathy CARDIAC: RRR, no murmurs, rubs, gallops RESPIRATORY:  Clear to auscultation without rales, wheezing or rhonchi  ABDOMEN: Soft, non-tender, non-distended MUSCULOSKELETAL:  No edema; No deformity  SKIN: Warm and dry NEUROLOGIC:  Alert and oriented x 3 PSYCHIATRIC:  Normal affect   ASSESSMENT:    1. S/P TAVR (transcatheter aortic valve replacement)  2. CAD in native artery   3. Abdominal aortic aneurysm (AAA) without rupture, unspecified part ([place])   4. Essential hypertension     PLAN:    In order of problems listed above:  Severe AS s/p TAVR: e   Patient is doing quite well with NYHA class I symptoms. Continue aspirin and Plavix given CAD/PAD.   SBE prophylaxis discussed; the patient is edentulous and does not go to the dentist. Continue regular follow up with Dr. Aundra Dubin    CAD with angina: he underwent successful orbital atherectomy and stenting of severe calcific stenosis in the proximal LAD, procedure guided by OCT imaging, treated with a 4.0 x 12 mm resolute Onyx DES on 04/27/20. There was moderate left circumflex stenosis with negative pressure wire analysis. He will be continued on uninterrupted DAPT with aspirin and Plavix. Continue statin, BB and nitrates. No chest pain.     AAA: s/p endovascular repair in Jan 2023     HTN:     Medication Adjustments/Labs and Tests Ordered: Current medicines are reviewed at length with the patient today.  Concerns regarding medicines are outlined above.  No orders of the defined types were placed in this encounter.   No orders of the defined types were placed in this encounter.    There are no Patient Instructions on file for this visit.   Signed, Angelena Form, PA-C  06/22/2021 2:50 PM    [facility]

## 2021-06-22 NOTE — Progress Notes (Signed)
Office Visit Note  Patient: Brandon [name]             Date of Birth: Feb 07, 1940           MRN: [id number]             PCP: Haydee Salter, MD Referring: Haydee Salter, MD Visit Date: 07/06/2021 Occupation: '@GUAROCC'$ @  Subjective:  Medication management  History of Present Illness: Brandon [name] is a 81 y.o. male with history of polymyalgia rheumatica, osteoarthritis and osteoporosis.  He was recently hospitalized and was discharged on Jun 29, 2021.  I reviewed the hospital discharge note he developed acute renal failure due to nausea  diarrhea.  He also developed  metabolic encephalopathy with questionable gabapentin toxicity.  He continues to have lower back pain.  He states he had some recent injections by Dr. Nelva Bush which were helpful.  He is currently on prednisone 9 mg p.o. daily for polymyalgia rheumatica.  He has been taking allopurinol 200 mg daily without any side effects.  He has not had a gout flare.  Activities of Daily Living:  Patient reports morning stiffness for 2 hours.   Patient Denies nocturnal pain.  Difficulty dressing/grooming: Denies Difficulty climbing stairs: Denies Difficulty getting out of chair: Denies Difficulty using hands for taps, buttons, cutlery, and/or writing: Denies  Review of Systems  Constitutional:  Negative for fatigue.  HENT:  Negative for mouth sores, mouth dryness and nose dryness.   Eyes:  Positive for dryness. Negative for pain and itching.  Respiratory:  Negative for shortness of breath and difficulty breathing.   Cardiovascular:  Negative for chest pain and palpitations.  Gastrointestinal:  Negative for blood in stool, constipation and diarrhea.  Endocrine: Negative for increased urination.  Genitourinary:  Negative for difficulty urinating.  Musculoskeletal:  Positive for myalgias, morning stiffness and myalgias. Negative for joint pain, joint pain, joint swelling and muscle tenderness.  Skin:  Negative for color change, rash and  redness.  Allergic/Immunologic: Negative for susceptible to infections.  Neurological:  Positive for numbness, memory loss and weakness. Negative for dizziness and headaches.  Hematological:  Positive for bruising/bleeding tendency.  Psychiatric/Behavioral:  Positive for confusion.    PMFS History:  Patient Active Problem List   Diagnosis Date Noted   ARF (acute renal failure) ([place]) 06/26/2021   Hyponatremia 06/26/2021   Hyperkalemia 67/61/9509   Acute metabolic encephalopathy 32/67/1245   Myoclonic jerking 06/26/2021   Lumbar postlaminectomy syndrome 05/03/2021   Degeneration of lumbar intervertebral disc 05/03/2021   Lumbar spondylosis 05/03/2021   History of hip fracture 04/24/2021   S/P abdominal aortic aneurysm repair 04/24/2021   Chronic allergic rhinitis 01/24/2021   Demand ischemia (HCC)    Acute respiratory failure with hypoxia (HCC)    Elevated troponin    Chronic diastolic heart failure ([place]) 12/03/2020   Steatosis of liver 11/01/2020   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   Hepatitis B core antibody positive 11/01/2020   Benign prostatic hyperplasia 11/01/2020   Chronic obstructive pulmonary disease ([place]) 07/20/2020   History of placement of stent in LAD coronary artery 07/20/2020   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   TIA (transient ischemic attack)    Esophageal stricture    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[place]) 04/19/2020   Primary osteoarthritis of both hands 01/11/2020   Piriformis syndrome of both sides 09/23/2019   Pain of both shoulder joints 07/29/2019   Sciatica associated with disorder of lumbar spine 04/24/2019  Primary osteoarthritis involving multiple joints 04/24/2019   Chronic pain syndrome 04/24/2019   Anxiety and depression 04/15/2018   Cancer of tonsillar fossa ([place]) 09/20/2017   Liver abscess 07/10/2016   Symptomatic anemia 07/10/2016   Essential hypertension 05/19/2013    Osteoarthritis 05/29/2009   Psoriasis 01/13/2009   Coronary artery disease 09/15/2008   Diverticulosis of colon 08/27/2007   History of benign prostatic hyperplasia 08/27/2007   Hyperlipidemia 03/19/2007   Gastroesophageal reflux disease 03/19/2007    Past Medical History:  Diagnosis Date   Abdominal aneurysm ([place])    Arthritis    "all over" (07/19/2016)   BPH (benign prostatic hypertrophy)    CAD (coronary artery disease)    Chicken pox    Chronic lower back pain    s/p surgical fusion   Depression    Diverticulosis    Esophageal stricture    GERD (gastroesophageal reflux disease)    Hepatitis B 1984   Hiatal hernia    History of radiation therapy 01/16/18- 03/05/18   Left Tonsil, 66 Gy in 33 fractions to high risk nodal echelons.    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 Burt Knack and Dr Cyndia Bent    Severe aortic stenosis    TIA (transient ischemic attack) 1990s   hx   tonsillar ca dx'd 11/2017   Tubular adenoma of colon 2009    Family History  Problem Relation Age of Onset   Heart disease Father 56       Living   Coronary artery disease Father        CABG   Alzheimer's disease Mother 76       Deceased   Arthritis Mother    Aneurysm Brother    Stomach cancer Maternal Uncle    Brain cancer Maternal Aunt        x2   Obesity Daughter        Had Bypass Sx   Past Surgical History:  Procedure Laterality Date   ABDOMINAL AORTIC ENDOVASCULAR STENT GRAFT N/A 03/03/2021   Procedure: ABDOMINAL AORTIC ENDOVASCULAR STENT GRAFT;  Surgeon: Waynetta Sandy, MD;  Location: [facility];  Service: Vascular;  Laterality: N/A;   ABDOMINAL AORTOGRAM W/LOWER EXTREMITY N/A 04/10/2021   Procedure: ABDOMINAL AORTOGRAM W/LOWER EXTREMITY;  Surgeon: Waynetta Sandy, MD;  Location: [facility];  Service: Cardiovascular;  Laterality: N/A;   BACK SURGERY     CARDIAC CATHETERIZATION     CATARACT EXTRACTION W/ INTRAOCULAR LENS  IMPLANT, BILATERAL Bilateral 01/2012 - 02/2012   CORONARY ATHERECTOMY N/A 04/27/2020   Procedure: CORONARY ATHERECTOMY;  Surgeon: Sherren Mocha, MD;  Location: [facility];  Service: Cardiovascular;  Laterality: N/A;   CORONARY STENT INTERVENTION N/A 04/27/2020   Procedure: CORONARY STENT INTERVENTION;  Surgeon: Sherren Mocha, MD;  Location: [facility];  Service: Cardiovascular;  Laterality: N/A;   DIRECT LARYNGOSCOPY Left 10/09/2017   Procedure: DIRECT LARYNGOSCOPY WITH BOPSY;  Surgeon: Jodi Marble, MD;  Location: [facility];  Service: ENT;  Laterality: Left;   ESOPHAGOGASTRODUODENOSCOPY (EGD) WITH ESOPHAGEAL DILATION     "couple times" (07/19/2016)   ESOPHAGOSCOPY Left 10/09/2017   Procedure: ESOPHAGOSCOPY;  Surgeon: Jodi Marble, MD;  Location: [facility];  Service: ENT;  Laterality: Left;   INTRAVASCULAR IMAGING/OCT N/A 04/27/2020  Procedure: INTRAVASCULAR IMAGING/OCT;  Surgeon: Sherren Mocha, MD;  Location: [facility];  Service: Cardiovascular;  Laterality: N/A;   INTRAVASCULAR PRESSURE WIRE/FFR STUDY N/A 04/27/2020   Procedure: INTRAVASCULAR PRESSURE WIRE/FFR STUDY;  Surgeon: Sherren Mocha, MD;  Location: [facility];  Service: Cardiovascular;  Laterality: N/A;   IR GASTROSTOMY TUBE MOD SED  01/08/2018   IR THORACENTESIS ASP PLEURAL SPACE W/IMG GUIDE  07/19/2016   LAPAROSCOPIC CHOLECYSTECTOMY  1984   LUMBAR [place] SURGERY  05/1996   L4-5; Dr. Claudean Kinds LAMINECTOMY/DECOMPRESSION MICRODISCECTOMY  10/2002   L3-4. Dr. Sherwood Gambler   MULTIPLE TOOTH EXTRACTIONS  1980s   PARTIAL GLOSSECTOMY  12/02/2017   Dr. Nicolette Bang- [facility]   pharyngoplasty for closure of tingue base defect  12/02/2017   Dr. Nicolette Bang- [place]  07/15/2016   POSTERIOR LUMBAR FUSION  09/1996    Ray cage, L4-5 Dr. Rita Ohara   PROSTATE BIOPSY  ~ 2017   radical tonsillectomy Left 12/02/2017   Dr. Nicolette Bang at [facility] N/A 03/31/2020   Procedure: RIGHT HEART CATH AND CORONARY ANGIOGRAPHY;  Surgeon: Larey Dresser, MD;  Location: [facility];  Service: Cardiovascular;  Laterality: N/A;   RIGID BRONCHOSCOPY Left 10/09/2017   Procedure: RIGID BRONCHOSCOPY;  Surgeon: Jodi Marble, MD;  Location: [facility];  Service: ENT;  Laterality: Left;   TEE WITHOUT CARDIOVERSION N/A 06/07/2020   Procedure: TRANSESOPHAGEAL ECHOCARDIOGRAM (TEE);  Surgeon: Sherren Mocha, MD;  Location: [facility];  Service: Open Heart Surgery;  Laterality: N/A;   TONSILLECTOMY     TOTAL HIP ARTHROPLASTY Left 12/04/2020   Procedure: TOTAL HIP ARTHROPLASTY ANTERIOR APPROACH;  Surgeon: Rod Can, MD;  Location: [facility];  Service: Orthopedics;  Laterality: Left;   TOTAL HIP ARTHROPLASTY Left 11/2020   TRACHEOSTOMY  12/02/2017   Dr. Nicolette Bang- [facility]   ULTRASOUND GUIDANCE FOR VASCULAR ACCESS Right 06/07/2020   Procedure: ULTRASOUND GUIDANCE FOR VASCULAR ACCESS;  Surgeon: Sherren Mocha, MD;  Location: [facility];  Service: Open Heart Surgery;  Laterality: Right;   ULTRASOUND GUIDANCE FOR VASCULAR ACCESS Bilateral 03/03/2021   Procedure: ULTRASOUND GUIDANCE FOR VASCULAR ACCESS, BILATERAL FEMORAL ARTERIES;  Surgeon: Waynetta Sandy, MD;  Location[facility];  Service: Vascular;  Laterality: Bilateral;   VASCULAR SURGERY     Social History   Social History Narrative   ** Merged History Encounter **       Married (3rd), [place]. 2 children from 1st marriage, 4 step children.    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       Cell # [phone]   Immunization History  Administered Date(s) Administered   Fluad Quad(high Dose 65+) 10/15/2018, 11/06/2019, 10/20/2020   Influenza  Split 11/26/2010, 10/11/2011, 11/03/2012   Influenza Whole 11/14/2006, 12/04/2007, 10/06/2008, 10/25/2009   Influenza, High Dose Seasonal PF 11/01/2016, 10/30/2017   Influenza, Seasonal, Injecte, Preservative Fre 12/07/2014   Influenza,inj,Quad PF,6+ Mos 11/04/2015   Influenza-Unspecified 11/19/2013   PFIZER(Purple Top)SARS-COV-2 Vaccination 02/25/2019, 03/18/2019, 11/16/2019   Pneumococcal Conjugate-13 12/07/2014   Pneumococcal Polysaccharide-23 05/30/2017   Tdap 01/28/2020     Objective: Vital Signs: BP 111/62 (BP Location: Left Arm, Patient Position: Sitting, Cuff Size: Normal)   Pulse 80   Ht 5' 6.5" (1.689 m)   Wt 169 lb (76.7 kg)   BMI 26.87 kg/m    Physical Exam  Musculoskeletal Exam: He had limited lateral rotation of the cervical spine.  He had thoracic kyphosis.  He had discomfort range of motion of the lumbar spine.  Shoulder joints, elbow joints, wrist joints, MCPs PIPs and DIPs with good range of motion.  He had bilateral PIP, DIP thickening and CMC thickening.  No synovitis was noted.  Left hip joint is replaced and was in good range of motion.  Right hip joint was also in good range of motion without discomfort.  Knee joints with good range of motion.  He had no tenderness over ankles or MTPs.  CDAI Exam: CDAI Score: -- Patient Global: --; Provider Global: -- Swollen: --; Tender: -- Joint Exam 07/06/2021   No joint exam has been documented for this visit   There is currently no information documented on the homunculus. Go to the Rheumatology activity and complete the homunculus joint exam.  Investigation: No additional findings.  Imaging: DG Chest 2 View  Result Date: 06/28/2021 CLINICAL DATA:  Tremors, acute renal failure. EXAM: CHEST - 2 VIEW COMPARISON:  06/26/2021 FINDINGS: Chronic left ventricular prominence. Chronic aortic atherosclerotic calcification. Previous TAVR. The pulmonary vascularity is normal. The lungs are clear. No visible effusion. No  significant bone finding. IMPRESSION: No acute process.  Resolution of basilar atelectasis. Electronically Signed   By: Nelson Chimes M.D.   On: 06/28/2021 08:07   CT Head Wo Contrast  Result Date: 06/26/2021 CLINICAL DATA:  Mental status change, unknown cause is EXAM: CT HEAD WITHOUT CONTRAST TECHNIQUE: Contiguous axial images were obtained from the base of the skull through the vertex without intravenous contrast. RADIATION DOSE REDUCTION: This exam was performed according to the departmental dose-optimization program which includes automated exposure control, adjustment of the mA and/or kV according to patient size and/or use of iterative reconstruction technique. COMPARISON:  None Available. FINDINGS: Brain: No acute intracranial hemorrhage. No focal mass lesion. No CT evidence of acute infarction. No midline shift or mass effect. No hydrocephalus. Basilar cisterns are patent. There are periventricular and subcortical white matter hypodensities. Generalized cortical atrophy. Vascular: No hyperdense vessel or unexpected calcification. Skull: Normal. Negative for fracture or focal lesion. Sinuses/Orbits: Paranasal sinuses and mastoid air cells are clear. Orbits are clear. Other: None. IMPRESSION: No acute intracranial findings. Electronically Signed   By: Suzy Bouchard M.D.   On: 06/26/2021 09:33   MR BRAIN WO CONTRAST  Result Date: 06/28/2021 CLINICAL DATA:  Mental status change, unknown cause. EXAM: MRI HEAD WITHOUT CONTRAST TECHNIQUE: Multiplanar, multiecho pulse sequences of the brain and surrounding structures were obtained without intravenous contrast. COMPARISON:  Head CT Jun 26, 2021. FINDINGS: Brain: No acute infarction, hemorrhage, hydrocephalus, extra-axial collection or mass lesion. Scattered and confluent T2 hyperintensity within the white matter of the cerebral hemispheres and within the pons, nonspecific, most likely related to chronic microangiopathy. Moderate parenchymal volume loss.  Punctate focus of susceptibility artifact in the medial left parietal lobe, suggestive of tiny hemosiderin deposit. Vascular: Normal flow voids. Skull and upper cervical spine: Normal marrow signal. Sinuses/Orbits: Small amount of secretion within the right maxillary sinus. Bilateral lens surgery. Other: None. IMPRESSION: 1. No acute intracranial abnormality. 2. Moderate chronic microvascular ischemic change of white matter and parenchymal volume loss. Electronically Signed   By: Pedro Earls M.D.   On: 06/28/2021 15:13   US RENAL  Result Date: 06/26/2021 CLINICAL DATA:  Acute renal dysfunction EXAM: RENAL / URINARY TRACT ULTRASOUND COMPLETE COMPARISON:  CT done on 03/24/2021 and abdominal sonogram done on 03/17/2020 FINDINGS:  Right Kidney: Renal measurements: 12.7 x 6.5 x 5.2 cm = volume: 226.1 mL. Echogenicity within normal limits. No mass or hydronephrosis visualized. Left Kidney: Renal measurements: 12.9 x 6.4 x 5.2 cm = volume: 224.8 mL. Echogenicity within normal limits. No mass or hydronephrosis visualized. Bladder: Appears normal for degree of bladder distention. Other: None. IMPRESSION: No sonographic abnormality is seen in the kidneys. There is no hydronephrosis. Electronically Signed   By: Elmer Picker M.D.   On: 06/26/2021 13:27   DG Chest Port 1 View  Result Date: 06/26/2021 CLINICAL DATA:  Weakness. EXAM: PORTABLE CHEST 1 VIEW COMPARISON:  03/03/2021 FINDINGS: 0933 hours. Low volume film. Streaky opacity in the lung bases suggest atelectasis. Cardiopericardial silhouette is at upper limits of normal for size. No pulmonary edema or focal airspace consolidation. No substantial pleural effusion. Telemetry leads overlie the chest. IMPRESSION: Low volume film with basilar atelectasis. Electronically Signed   By: Misty Stanley M.D.   On: 06/26/2021 09:46   EEG adult  Result Date: 06/27/2021 Lora Havens, MD     06/28/2021  9:36 AM Patient Name: BOOKER BHATNAGAR MRN:  [id number] Epilepsy Attending: Lora Havens Referring Physician/Provider: August Albino, NP Date: 06/27/2021 Duration: 24.33 mins Patient history: 81 y.o. male with past medical history of HTN, HLD, CHF, BPH, anxiety/depression, TIA, CAD who presents to the [facility] for evaluation of jerking all over his body and confusion. EEG to evaluate for seizure Level of alertness: Awake, asleep AEDs during EEG study: None Technical aspects: This EEG study was done with scalp electrodes positioned according to the 10-20 International system of electrode placement. Electrical activity was acquired at a sampling rate of '500Hz'$  and reviewed with a high frequency filter of '70Hz'$  and a low frequency filter of '1Hz'$ . EEG data were recorded continuously and digitally stored. Description: The posterior dominant rhythm consists of '8Hz'$  activity of moderate voltage (25-35 uV) seen predominantly in posterior head regions, symmetric and reactive to eye opening and eye closing. Sleep was characterized by vertex waves, sleep spindles (12 to 14 Hz), maximal frontocentral region. EEG showed intermittent generalized  3 to 6 Hz theta-delta slowing. Patient was noted to have episodes of bilateral lower extremity and upper extremity asynchronous twitching.  Concomitant EEG before, during and after the event did not show any EEG changes suggest seizure. Hyperventilation and photic stimulation were not performed.   ABNORMALITY - Intermittent slow, generalized IMPRESSION: This study is suggestive of mild diffuse encephalopathy, nonspecific etiology. No seizures or epileptiform discharges were seen throughout the recording. Patient was noted to have episodes of bilateral lower extremity and upper extremity asynchronous twitching without concomitant EEG change. These episodes were most likely NOT epileptic. Priyanka Barbra Sarks    Recent Labs: Lab Results  Component Value Date   WBC 3.9 (L) 06/28/2021   HGB 10.1 (L) 06/28/2021   PLT 104 (L) 06/28/2021    NA 139 06/29/2021   K 4.8 06/29/2021   CL 107 06/29/2021   CO2 25 06/29/2021   GLUCOSE 113 (H) 06/29/2021   BUN 34 (H) 06/29/2021   CREATININE 1.13 06/29/2021   BILITOT 0.6 06/26/2021   ALKPHOS 103 06/26/2021   AST 13 (L) 06/26/2021   ALT 13 06/26/2021   PROT 6.0 (L) 06/26/2021   ALBUMIN 3.2 (L) 06/26/2021   CALCIUM 8.8 (L) 06/29/2021   GFRAA 107 08/11/2020   QFTBGOLDPLUS NEGATIVE 01/11/2020    Speciality Comments:  Fosamax started on January 26, 2021.  If he has dysphagia with Fosamax or  any GI intolerance we will have to switch him to Reclast.  Procedures:  No procedures performed Allergies: Celebrex [celecoxib] and Tape   Assessment / Plan:     Visit Diagnoses: Polymyalgia rheumatica ([place]) - History of muscle weakness and tenderness, elevated sed rate. Prednisone responsive: He had been on prednisone taper.  He is currently on prednisone 9 mg p.o. daily.  He had no muscular weakness or tenderness on my examination.  He was advised to taper prednisone by 1 mg every 2 months.  He will be reducing the dose to prednisone 8 mg p.o. daily now.  Long term (current) use of systemic steroids - The plan is to continue to try to taper prednisone by 1 mg every 2 months.  Primary osteoarthritis of both hands -he had bilateral PIP DIP and CMC thickening consistent with osteoarthritis.  No synovitis was noted.  He has positive ANA but no clinical features of autoimmune disease.  ENA panel was negative.    Status post total hip replacement, left - 12/04/20-  Patient tripped and fell in October.  He acquired left hip fracture and underwent left hip arthroplasty.  He had good range of motion of the hip joint.  DDD (degenerative disc disease), lumbar-he was having increased lower back pain recently.  He responded well to the injections done by Dr. Nelva Bush.  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  He has been on allopurinol 200 mg p.o. daily.  His  last uric acid was 5.5 on September 08, 2020.  I advised him to get uric acid with his next labs.  Synovial analysis on 07/01/2020 revealed extracellular monosodium urate crystals and intracellular calcium pyrophosphate crystals.   Osteopenia of multiple sites - DEXA scan 07/21/20: showed T score -2.2 in the right femoral neck, BMD 0.638.  No comparison available.  He has been on Fosamax 70 mg p.o. daily since December 2022.  He has been tolerating Fosamax without any side effects.  Use of calcium rich diet, calcium supplement and vitamin D was advised.  Vitamin D deficiency-he is on vitamin D supplement.  History of recent hospitalization-according the patient he was hospitalized due to dehydration from diarrhea.  I reviewed the hospital discharge summary from Jun 29, 2021.  Patient was hospitalized due to dehydration most likely due to diarrhea and also encephalopathy most likely due to gabapentin overdose.  He has reduced the dose of gabapentin.  He also developed acute renal failure with creatinine of 5.62 on Jun 26, 2021.  Last creatinine level on Jun 29, 2021 was normal.  His ionized calcium was normal.  Chronic pain syndrome -he is followed by pain management-Dr. Lovorn.  He was recently hospitalized with encephalopathy after dehydration.  The dose of gabapentin was reduced at the discharge from the hospital.  Psoriasis -he had no active psoriasis lesions.  Patient developed psoriasis 15 years ago which lasted for 1 year without any recurrence.    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   Aneurysm of infrarenal abdominal aorta, unspecified whether ruptured [facility]) -he is followed by Dr. Donzetta Matters. Pre TAVR CT showed large infrarenal abdominal aortic aneurysm which is increasing in size.   Other medical problems listed as follows:  History of diverticulitis  Liver abscess - In 2018 due to diverticulitis.  Treated with IV antibiotics over 3 weeks.  History of  gastroesophageal reflux (GERD)  History of aortic valve replacement - Performed by Dr. Cyndia Bent and Dr. Danise Edge TAVR on  06/07/20.  Essential hypertension  History of hyperlipidemia  Cancer of tonsillar fossa ([place]) - Treated with surgery and radiation therapy in 2019.  Anxiety and depression  Former smoker - one pack per day for 50 years.  Orders: No orders of the defined types were placed in this encounter.  No orders of the defined types were placed in this encounter.    Follow-Up Instructions: Return for Polymyalgia rheumatica, Osteoarthritis, Gout.   Bo Merino, MD  Note - This record has been created using Editor, commissioning.  Chart creation errors have been sought, but may not always  have been located. Such creation errors do not reflect on  the standard of medical care.

## 2021-06-23 ENCOUNTER — Ambulatory Visit: Payer: Medicare Other

## 2021-06-23 ENCOUNTER — Telehealth: Payer: Self-pay

## 2021-06-23 ENCOUNTER — Ambulatory Visit (HOSPITAL_COMMUNITY): Payer: Medicare Other

## 2021-06-23 DIAGNOSIS — I714 Abdominal aortic aneurysm, without rupture, unspecified: Secondary | ICD-10-CM

## 2021-06-23 DIAGNOSIS — Z952 Presence of prosthetic heart valve: Secondary | ICD-10-CM

## 2021-06-23 DIAGNOSIS — I2510 Atherosclerotic heart disease of native coronary artery without angina pectoris: Secondary | ICD-10-CM

## 2021-06-23 DIAGNOSIS — I10 Essential (primary) hypertension: Secondary | ICD-10-CM

## 2021-06-23 MED ORDER — HYDROXYZINE PAMOATE 25 MG PO CAPS: 25.0000 mg | ORAL_CAPSULE | Freq: Three times a day (TID) | ORAL | 0 refills | Status: DC | PRN

## 2021-06-23 NOTE — Telephone Encounter (Signed)
PA for Hydroxyzine submitted through cover my meds.   Awaiting response.  Dm/cma   Key: [id number]

## 2021-06-23 NOTE — Telephone Encounter (Signed)
Patient notified Via phone. Dm/cma  

## 2021-06-25 ENCOUNTER — Other Ambulatory Visit (HOSPITAL_COMMUNITY): Payer: Self-pay | Admitting: Cardiology

## 2021-06-26 ENCOUNTER — Inpatient Hospital Stay (HOSPITAL_COMMUNITY): Payer: Medicare Other

## 2021-06-26 ENCOUNTER — Emergency Department (HOSPITAL_COMMUNITY): Payer: Medicare Other

## 2021-06-26 ENCOUNTER — Inpatient Hospital Stay (HOSPITAL_COMMUNITY)
Admission: EM | Admit: 2021-06-26 | Discharge: 2021-06-29 | DRG: 682 | Disposition: A | Discharge status: Home Health | Payer: Medicare Other | Attending: Internal Medicine | Admitting: Internal Medicine

## 2021-06-26 ENCOUNTER — Encounter (HOSPITAL_COMMUNITY): Payer: Self-pay

## 2021-06-26 ENCOUNTER — Other Ambulatory Visit: Payer: Self-pay

## 2021-06-26 DIAGNOSIS — E861 Hypovolemia: Secondary | ICD-10-CM | POA: Diagnosis present

## 2021-06-26 DIAGNOSIS — N179 Acute kidney failure, unspecified: Principal | ICD-10-CM

## 2021-06-26 DIAGNOSIS — Z87438 Personal history of other diseases of male genital organs: Secondary | ICD-10-CM | POA: Diagnosis not present

## 2021-06-26 DIAGNOSIS — E785 Hyperlipidemia, unspecified: Secondary | ICD-10-CM | POA: Diagnosis present

## 2021-06-26 DIAGNOSIS — N401 Enlarged prostate with lower urinary tract symptoms: Secondary | ICD-10-CM | POA: Diagnosis present

## 2021-06-26 DIAGNOSIS — E875 Hyperkalemia: Secondary | ICD-10-CM | POA: Diagnosis present

## 2021-06-26 DIAGNOSIS — K219 Gastro-esophageal reflux disease without esophagitis: Secondary | ICD-10-CM

## 2021-06-26 DIAGNOSIS — G253 Myoclonus: Secondary | ICD-10-CM | POA: Diagnosis not present

## 2021-06-26 DIAGNOSIS — N19 Unspecified kidney failure: Secondary | ICD-10-CM | POA: Diagnosis not present

## 2021-06-26 DIAGNOSIS — I110 Hypertensive heart disease with heart failure: Secondary | ICD-10-CM | POA: Diagnosis present

## 2021-06-26 DIAGNOSIS — A084 Viral intestinal infection, unspecified: Secondary | ICD-10-CM | POA: Diagnosis present

## 2021-06-26 DIAGNOSIS — E871 Hypo-osmolality and hyponatremia: Secondary | ICD-10-CM | POA: Diagnosis not present

## 2021-06-26 DIAGNOSIS — Z7952 Long term (current) use of systemic steroids: Secondary | ICD-10-CM

## 2021-06-26 DIAGNOSIS — R4182 Altered mental status, unspecified: Secondary | ICD-10-CM | POA: Diagnosis not present

## 2021-06-26 DIAGNOSIS — E1142 Type 2 diabetes mellitus with diabetic polyneuropathy: Secondary | ICD-10-CM | POA: Diagnosis present

## 2021-06-26 DIAGNOSIS — G894 Chronic pain syndrome: Secondary | ICD-10-CM

## 2021-06-26 DIAGNOSIS — K449 Diaphragmatic hernia without obstruction or gangrene: Secondary | ICD-10-CM | POA: Diagnosis present

## 2021-06-26 DIAGNOSIS — N138 Other obstructive and reflux uropathy: Secondary | ICD-10-CM | POA: Diagnosis present

## 2021-06-26 DIAGNOSIS — D500 Iron deficiency anemia secondary to blood loss (chronic): Secondary | ICD-10-CM | POA: Diagnosis not present

## 2021-06-26 DIAGNOSIS — J449 Chronic obstructive pulmonary disease, unspecified: Secondary | ICD-10-CM | POA: Diagnosis present

## 2021-06-26 DIAGNOSIS — C099 Malignant neoplasm of tonsil, unspecified: Secondary | ICD-10-CM | POA: Diagnosis not present

## 2021-06-26 DIAGNOSIS — Z85819 Personal history of malignant neoplasm of unspecified site of lip, oral cavity, and pharynx: Secondary | ICD-10-CM

## 2021-06-26 DIAGNOSIS — Z888 Allergy status to other drugs, medicaments and biological substances status: Secondary | ICD-10-CM

## 2021-06-26 DIAGNOSIS — J9811 Atelectasis: Secondary | ICD-10-CM | POA: Diagnosis present

## 2021-06-26 DIAGNOSIS — Z8261 Family history of arthritis: Secondary | ICD-10-CM

## 2021-06-26 DIAGNOSIS — Z66 Do not resuscitate: Secondary | ICD-10-CM | POA: Diagnosis not present

## 2021-06-26 DIAGNOSIS — Z953 Presence of xenogenic heart valve: Secondary | ICD-10-CM

## 2021-06-26 DIAGNOSIS — K222 Esophageal obstruction: Secondary | ICD-10-CM

## 2021-06-26 DIAGNOSIS — M159 Polyosteoarthritis, unspecified: Secondary | ICD-10-CM | POA: Diagnosis present

## 2021-06-26 DIAGNOSIS — E669 Obesity, unspecified: Secondary | ICD-10-CM | POA: Diagnosis not present

## 2021-06-26 DIAGNOSIS — Z9181 History of falling: Secondary | ICD-10-CM

## 2021-06-26 DIAGNOSIS — Z87891 Personal history of nicotine dependence: Secondary | ICD-10-CM

## 2021-06-26 DIAGNOSIS — E538 Deficiency of other specified B group vitamins: Secondary | ICD-10-CM | POA: Diagnosis not present

## 2021-06-26 DIAGNOSIS — I5032 Chronic diastolic (congestive) heart failure: Secondary | ICD-10-CM | POA: Diagnosis not present

## 2021-06-26 DIAGNOSIS — T426X5A Adverse effect of other antiepileptic and sedative-hypnotic drugs, initial encounter: Secondary | ICD-10-CM | POA: Diagnosis present

## 2021-06-26 DIAGNOSIS — Z820 Family history of epilepsy and other diseases of the nervous system: Secondary | ICD-10-CM

## 2021-06-26 DIAGNOSIS — Z79899 Other long term (current) drug therapy: Secondary | ICD-10-CM

## 2021-06-26 DIAGNOSIS — Z7983 Long term (current) use of bisphosphonates: Secondary | ICD-10-CM

## 2021-06-26 DIAGNOSIS — Z8673 Personal history of transient ischemic attack (TIA), and cerebral infarction without residual deficits: Secondary | ICD-10-CM

## 2021-06-26 DIAGNOSIS — I10 Essential (primary) hypertension: Secondary | ICD-10-CM | POA: Diagnosis not present

## 2021-06-26 DIAGNOSIS — Z8249 Family history of ischemic heart disease and other diseases of the circulatory system: Secondary | ICD-10-CM

## 2021-06-26 DIAGNOSIS — J9601 Acute respiratory failure with hypoxia: Secondary | ICD-10-CM

## 2021-06-26 DIAGNOSIS — M542 Cervicalgia: Secondary | ICD-10-CM | POA: Diagnosis present

## 2021-06-26 DIAGNOSIS — I714 Abdominal aortic aneurysm, without rupture, unspecified: Secondary | ICD-10-CM | POA: Diagnosis present

## 2021-06-26 DIAGNOSIS — Z930 Tracheostomy status: Secondary | ICD-10-CM | POA: Diagnosis not present

## 2021-06-26 DIAGNOSIS — M353 Polymyalgia rheumatica: Secondary | ICD-10-CM

## 2021-06-26 DIAGNOSIS — Z7902 Long term (current) use of antithrombotics/antiplatelets: Secondary | ICD-10-CM

## 2021-06-26 DIAGNOSIS — Z79891 Long term (current) use of opiate analgesic: Secondary | ICD-10-CM

## 2021-06-26 DIAGNOSIS — Z96642 Presence of left artificial hip joint: Secondary | ICD-10-CM | POA: Diagnosis present

## 2021-06-26 DIAGNOSIS — D649 Anemia, unspecified: Secondary | ICD-10-CM | POA: Diagnosis not present

## 2021-06-26 DIAGNOSIS — Z886 Allergy status to analgesic agent status: Secondary | ICD-10-CM

## 2021-06-26 DIAGNOSIS — G9341 Metabolic encephalopathy: Secondary | ICD-10-CM | POA: Diagnosis present

## 2021-06-26 DIAGNOSIS — D509 Iron deficiency anemia, unspecified: Secondary | ICD-10-CM | POA: Diagnosis present

## 2021-06-26 DIAGNOSIS — M549 Dorsalgia, unspecified: Secondary | ICD-10-CM | POA: Diagnosis present

## 2021-06-26 DIAGNOSIS — E860 Dehydration: Secondary | ICD-10-CM | POA: Diagnosis present

## 2021-06-26 DIAGNOSIS — G9340 Encephalopathy, unspecified: Secondary | ICD-10-CM | POA: Diagnosis not present

## 2021-06-26 DIAGNOSIS — M545 Low back pain, unspecified: Secondary | ICD-10-CM | POA: Diagnosis present

## 2021-06-26 DIAGNOSIS — Z800 Family history of malignant neoplasm of digestive organs: Secondary | ICD-10-CM

## 2021-06-26 DIAGNOSIS — K760 Fatty (change of) liver, not elsewhere classified: Secondary | ICD-10-CM | POA: Diagnosis present

## 2021-06-26 DIAGNOSIS — R531 Weakness: Secondary | ICD-10-CM | POA: Diagnosis not present

## 2021-06-26 DIAGNOSIS — R296 Repeated falls: Secondary | ICD-10-CM | POA: Diagnosis present

## 2021-06-26 DIAGNOSIS — Z7189 Other specified counseling: Secondary | ICD-10-CM | POA: Diagnosis not present

## 2021-06-26 DIAGNOSIS — I2510 Atherosclerotic heart disease of native coronary artery without angina pectoris: Secondary | ICD-10-CM | POA: Diagnosis present

## 2021-06-26 DIAGNOSIS — Z923 Personal history of irradiation: Secondary | ICD-10-CM

## 2021-06-26 DIAGNOSIS — Z808 Family history of malignant neoplasm of other organs or systems: Secondary | ICD-10-CM

## 2021-06-26 DIAGNOSIS — Z6826 Body mass index (BMI) 26.0-26.9, adult: Secondary | ICD-10-CM

## 2021-06-26 DIAGNOSIS — I959 Hypotension, unspecified: Secondary | ICD-10-CM | POA: Diagnosis present

## 2021-06-26 DIAGNOSIS — L409 Psoriasis, unspecified: Secondary | ICD-10-CM | POA: Diagnosis present

## 2021-06-26 LAB — COMPREHENSIVE METABOLIC PANEL
ALT: 13 U/L (ref 0–44)
AST: 13 U/L — ABNORMAL LOW (ref 15–41)
Albumin: 3.2 g/dL — ABNORMAL LOW (ref 3.5–5.0)
Alkaline Phosphatase: 103 U/L (ref 38–126)
Anion gap: 11 (ref 5–15)
BUN: 86 mg/dL — ABNORMAL HIGH (ref 8–23)
CO2: 20 mmol/L — ABNORMAL LOW (ref 22–32)
Calcium: 8.8 mg/dL — ABNORMAL LOW (ref 8.9–10.3)
Chloride: 98 mmol/L (ref 98–111)
Creatinine, Ser: 7.43 mg/dL — ABNORMAL HIGH (ref 0.61–1.24)
GFR, Estimated: 7 mL/min — ABNORMAL LOW (ref >60)
Glucose, Bld: 94 mg/dL (ref 70–99)
Potassium: 6 mmol/L — ABNORMAL HIGH (ref 3.5–5.1)
Sodium: 129 mmol/L — ABNORMAL LOW (ref 135–145)
Total Bilirubin: 0.6 mg/dL (ref 0.3–1.2)
Total Protein: 6 g/dL — ABNORMAL LOW (ref 6.5–8.1)

## 2021-06-26 LAB — CBC WITH DIFFERENTIAL/PLATELET
Abs Immature Granulocytes: 0.04 10*3/uL (ref 0.00–0.07)
Basophils Absolute: 0 10*3/uL (ref 0.0–0.1)
Basophils Relative: 0 %
Eosinophils Absolute: 0.3 10*3/uL (ref 0.0–0.5)
Eosinophils Relative: 4 %
HCT: 29.5 % — ABNORMAL LOW (ref 39.0–52.0)
Hemoglobin: 9 g/dL — ABNORMAL LOW (ref 13.0–17.0)
Immature Granulocytes: 1 %
Lymphocytes Relative: 16 %
Lymphs Abs: 1 10*3/uL (ref 0.7–4.0)
MCH: 24.9 pg — ABNORMAL LOW (ref 26.0–34.0)
MCHC: 30.5 g/dL (ref 30.0–36.0)
MCV: 81.7 fL (ref 80.0–100.0)
Monocytes Absolute: 0.6 10*3/uL (ref 0.1–1.0)
Monocytes Relative: 8 %
Neutro Abs: 4.7 10*3/uL (ref 1.7–7.7)
Neutrophils Relative %: 71 %
Platelets: 125 10*3/uL — ABNORMAL LOW (ref 150–400)
RBC: 3.61 MIL/uL — ABNORMAL LOW (ref 4.22–5.81)
RDW: 17 % — ABNORMAL HIGH (ref 11.5–15.5)
WBC: 6.5 10*3/uL (ref 4.0–10.5)
nRBC: 0 % (ref 0.0–0.2)

## 2021-06-26 LAB — CK: Total CK: 138 U/L (ref 49–397)

## 2021-06-26 LAB — BASIC METABOLIC PANEL
Anion gap: 10 (ref 5–15)
BUN: 81 mg/dL — ABNORMAL HIGH (ref 8–23)
CO2: 19 mmol/L — ABNORMAL LOW (ref 22–32)
Calcium: 8.3 mg/dL — ABNORMAL LOW (ref 8.9–10.3)
Chloride: 100 mmol/L (ref 98–111)
Creatinine, Ser: 5.62 mg/dL — ABNORMAL HIGH (ref 0.61–1.24)
GFR, Estimated: 10 mL/min — ABNORMAL LOW (ref >60)
Glucose, Bld: 97 mg/dL (ref 70–99)
Potassium: 5.5 mmol/L — ABNORMAL HIGH (ref 3.5–5.1)
Sodium: 129 mmol/L — ABNORMAL LOW (ref 135–145)

## 2021-06-26 LAB — URINALYSIS, ROUTINE W REFLEX MICROSCOPIC
Bilirubin Urine: NEGATIVE
Glucose, UA: NEGATIVE mg/dL
Hgb urine dipstick: NEGATIVE
Ketones, ur: NEGATIVE mg/dL
Leukocytes,Ua: NEGATIVE
Nitrite: NEGATIVE
Protein, ur: NEGATIVE mg/dL
Specific Gravity, Urine: 1.011 (ref 1.005–1.030)
pH: 5 (ref 5.0–8.0)

## 2021-06-26 LAB — PREPARE RBC (CROSSMATCH)

## 2021-06-26 LAB — HEMOGLOBIN AND HEMATOCRIT, BLOOD
HCT: 25.9 % — ABNORMAL LOW (ref 39.0–52.0)
Hemoglobin: 7.8 g/dL — ABNORMAL LOW (ref 13.0–17.0)

## 2021-06-26 LAB — AMMONIA: Ammonia: 10 umol/L (ref 9–35)

## 2021-06-26 LAB — CREATININE, URINE, RANDOM: Creatinine, Urine: 194.94 mg/dL

## 2021-06-26 LAB — PHOSPHORUS: Phosphorus: 6.6 mg/dL — ABNORMAL HIGH (ref 2.5–4.6)

## 2021-06-26 LAB — MAGNESIUM: Magnesium: 2 mg/dL (ref 1.7–2.4)

## 2021-06-26 LAB — SODIUM, URINE, RANDOM: Sodium, Ur: 26 mmol/L

## 2021-06-26 MED ORDER — SODIUM CHLORIDE 0.9% IV SOLUTION: Freq: Once | INTRAVENOUS | Status: AC

## 2021-06-26 MED ORDER — PANTOPRAZOLE SODIUM 40 MG IV SOLR
40.0000 mg | Freq: Two times a day (BID) | INTRAVENOUS | Status: DC
  Administered 2021-06-27 (×2): 40 mg via INTRAVENOUS
  Filled 2021-06-26 (×2): qty 10

## 2021-06-26 MED ORDER — ONDANSETRON HCL 4 MG PO TABS: 4.0000 mg | ORAL_TABLET | Freq: Four times a day (QID) | ORAL | Status: DC | PRN

## 2021-06-26 MED ORDER — ACETAMINOPHEN 325 MG PO TABS
650.0000 mg | ORAL_TABLET | Freq: Four times a day (QID) | ORAL | Status: DC | PRN
  Administered 2021-06-29 (×2): 650 mg via ORAL
  Filled 2021-06-26 (×2): qty 2

## 2021-06-26 MED ORDER — ACETAMINOPHEN 650 MG RE SUPP: 650.0000 mg | Freq: Four times a day (QID) | RECTAL | Status: DC | PRN

## 2021-06-26 MED ORDER — CALCIUM GLUCONATE-NACL 1-0.675 GM/50ML-% IV SOLN
1.0000 g | Freq: Once | INTRAVENOUS | Status: AC
  Administered 2021-06-27: 1000 mg via INTRAVENOUS
  Filled 2021-06-26: qty 50

## 2021-06-26 MED ORDER — LACTATED RINGERS IV BOLUS
1000.0000 mL | Freq: Once | INTRAVENOUS | Status: AC
  Administered 2021-06-26: 1000 mL via INTRAVENOUS

## 2021-06-26 MED ORDER — SODIUM CHLORIDE 0.9% FLUSH
3.0000 mL | Freq: Two times a day (BID) | INTRAVENOUS | Status: DC
  Administered 2021-06-26 – 2021-06-29 (×6): 3 mL via INTRAVENOUS

## 2021-06-26 MED ORDER — ALBUTEROL SULFATE (2.5 MG/3ML) 0.083% IN NEBU: 2.5000 mg | INHALATION_SOLUTION | Freq: Four times a day (QID) | RESPIRATORY_TRACT | Status: DC | PRN

## 2021-06-26 MED ORDER — LACTATED RINGERS IV SOLN: INTRAVENOUS | Status: DC

## 2021-06-26 MED ORDER — OXYCODONE HCL 5 MG PO TABS
10.0000 mg | ORAL_TABLET | Freq: Four times a day (QID) | ORAL | Status: DC | PRN
  Administered 2021-06-28 – 2021-06-29 (×5): 10 mg via ORAL
  Filled 2021-06-26 (×6): qty 2

## 2021-06-26 MED ORDER — ONDANSETRON HCL 4 MG/2ML IJ SOLN: 4.0000 mg | Freq: Four times a day (QID) | INTRAMUSCULAR | Status: DC | PRN

## 2021-06-26 NOTE — H&P (Signed)
History and Physical    Patient: Brandon [name]:[id number] DOB: 03-21-1940 DOA: 06/26/2021 DOS: the patient was seen and examined on 06/26/2021 PCP: Haydee Salter, MD  Patient coming from: Home  Chief Complaint:  Chief Complaint  Patient presents with   Tremors   HPI: Brandon [name] is a 81 y.o. male with medical history significant of hypertension, hyperlipidemia, chronic diastolic CHF, CAD, aortic stenosis s/p TAVR on Plavix, TIA, COPD, and chronic back pain who presents with complaints of jerking all over over the last 2-1/2 days.  History is obtained from the patient as well as his wife at bedside.  He had recently completed a course of prednisone and doxycycline for acute exacerbation of chronic bronchitis at the beginning of this month. Last week patient had reported having nausea, vomiting, and diarrhea and was unable to eat for approximately 3 days.  He was seen by his primary care provider in 5/16 for the symptoms.  At that time it was recommended that patient push IV fluids and he was given Zofran for nausea.  Since that time he reported nausea and vomiting, and diarrhea symptoms had resolved and he was able to start eating.  However, wife reports that he had developed jerking of his upper and subsequently lower extremities.  No reported loss of consciousness.  Due to the jerking he had had difficulty feeding himself.  He had several falls while at home over the last 2 weeks.  Last fall which occurred yesterday related to jerking movements.  She notes associated symptoms of headache, confusion and generalized weakness.  Normally at baseline patient did not require assistance with ambulation.  Review of records notes last colonoscopy by Dr. Fuller Plan back in 2017 which noted 2 sessile polyps of the sigmoid and transverse colon that were removed, moderate diverticulosis of the sigmoid and descending colon, mild diverticulosis of the transverse colon grade 1 internal hemorrhoids.  EGD at that time  noted stricture at the GE junction and hiatal hernia.  Upon admission to the emergency department patient was seen to be afebrile with blood pressure 81/62 to 130/113.  Labs significant for hemoglobin 9 platelet count 125, sodium 129, potassium 6, CO2 20, BUN 86, creatinine 7.43, and CK 138.  CT scan of the head did not note any acute abnormalities.  Chest x-ray noted low lung volumes with basilar atelectasis.  Patient had been given 1 L lactated Ringer's and started on lactated Ringer's at 125 mL/h.  Review of Systems: As mentioned in the history of present illness. All other systems reviewed and are negative. 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 Burt Knack and Dr Cyndia Bent    Severe aortic  stenosis    TIA (transient ischemic attack) 1990s   hx   tonsillar ca dx'd 11/2017   Tubular adenoma of colon 2009   Past Surgical History:  Procedure Laterality Date   ABDOMINAL AORTIC ENDOVASCULAR STENT GRAFT N/A 03/03/2021   Procedure: ABDOMINAL AORTIC ENDOVASCULAR STENT GRAFT;  Surgeon: Waynetta Sandy, MD;  Location: [place];  Service: Vascular;  Laterality: N/A;   ABDOMINAL AORTOGRAM W/LOWER EXTREMITY N/A 04/10/2021   Procedure: ABDOMINAL AORTOGRAM W/LOWER EXTREMITY;  Surgeon: Waynetta Sandy, MD;   Location: [place];  Service: Cardiovascular;  Laterality: N/A;   BACK SURGERY     CARDIAC CATHETERIZATION     CATARACT EXTRACTION W/ INTRAOCULAR LENS  IMPLANT, BILATERAL Bilateral 01/2012 - 02/2012   CORONARY ATHERECTOMY N/A 04/27/2020   Procedure: CORONARY ATHERECTOMY;  Surgeon: Sherren Mocha, MD;  Location: [place] [facility];  Service: Cardiovascular;  Laterality: N/A;   CORONARY STENT INTERVENTION N/A 04/27/2020   Procedure: CORONARY STENT INTERVENTION;  Surgeon: Sherren Mocha, MD;  Location: [place] [facility];  Service: Cardiovascular;  Laterality: N/A;   DIRECT LARYNGOSCOPY Left 10/09/2017   Procedure: DIRECT LARYNGOSCOPY WITH BOPSY;  Surgeon: Jodi Marble, MD;  Location: [place];  Service: ENT;  Laterality: Left;   ESOPHAGOGASTRODUODENOSCOPY (EGD) WITH ESOPHAGEAL DILATION     "couple times" (07/19/2016)   ESOPHAGOSCOPY Left 10/09/2017   Procedure: ESOPHAGOSCOPY;  Surgeon: Jodi Marble, MD;  Location: [place];  Service: ENT;  Laterality: Left;   INTRAVASCULAR IMAGING/OCT N/A 04/27/2020   Procedure: INTRAVASCULAR IMAGING/OCT;  Surgeon: Sherren Mocha, MD;  Location: [facility];  Service: Cardiovascular;  Laterality: N/A;   INTRAVASCULAR PRESSURE WIRE/FFR STUDY N/A 04/27/2020   Procedure: INTRAVASCULAR PRESSURE WIRE/FFR STUDY;  Surgeon: Sherren Mocha, MD;  Location: [facility];  Service: Cardiovascular;  Laterality: N/A;   IR GASTROSTOMY TUBE MOD SED  01/08/2018   IR THORACENTESIS ASP PLEURAL SPACE W/IMG GUIDE  07/19/2016   LAPAROSCOPIC CHOLECYSTECTOMY  1984   LUMBAR [place] SURGERY  05/1996   L4-5; Dr. Claudean Kinds LAMINECTOMY/DECOMPRESSION MICRODISCECTOMY  10/2002   L3-4. Dr. Sherwood Gambler   MULTIPLE TOOTH EXTRACTIONS  1980s   PARTIAL GLOSSECTOMY  12/02/2017   Dr. Nicolette Bang- [facility]   pharyngoplasty for closure of tingue base defect  12/02/2017   Dr. Nicolette Bang- [place]  07/15/2016   POSTERIOR  LUMBAR FUSION  09/1996   Ray cage, L4-5 Dr. Rita Ohara   PROSTATE BIOPSY  ~ 2017   radical tonsillectomy Left 12/02/2017   Dr. Nicolette Bang at [place] N/A 03/31/2020   Procedure: RIGHT HEART CATH AND CORONARY ANGIOGRAPHY;  Surgeon: Larey Dresser, MD;  Location: [place] [facility];  Service: Cardiovascular;  Laterality: N/A;   RIGID BRONCHOSCOPY Left 10/09/2017   Procedure: RIGID BRONCHOSCOPY;  Surgeon: Jodi Marble, MD;  Location: [place];  Service: ENT;  Laterality: Left;   TEE WITHOUT CARDIOVERSION N/A 06/07/2020   Procedure: TRANSESOPHAGEAL ECHOCARDIOGRAM (TEE);  Surgeon: Sherren Mocha, MD;  Location: [place];  Service: Open Heart Surgery;  Laterality: N/A;   TONSILLECTOMY     TOTAL HIP ARTHROPLASTY Left 12/04/2020   Procedure: TOTAL HIP ARTHROPLASTY ANTERIOR APPROACH;  Surgeon: Rod Can, MD;  Location: [facility];  Service: Orthopedics;  Laterality: Left;   TOTAL HIP ARTHROPLASTY Left 11/2020   TRACHEOSTOMY  12/02/2017   Dr. [facility]icolette [facility]- [facility]   ULTRASOUND GUIDANCE FOR VASCULAR ACCESS Right 06/07/2020   Procedure:  ULTRASOUND GUIDANCE FOR VASCULAR ACCESS;  Surgeon: Sherren Mocha, MD;  Location: [place];  Service: Open Heart Surgery;  Laterality: Right;   ULTRASOUND GUIDANCE FOR VASCULAR ACCESS Bilateral 03/03/2021   Procedure: ULTRASOUND GUIDANCE FOR VASCULAR ACCESS, BILATERAL FEMORAL ARTERIES;  Surgeon: Waynetta Sandy, MD;  Location: [place];  Service: Vascular;  Laterality: Bilateral;   VASCULAR SURGERY     Social History:  reports that he quit smoking about 3 years ago. His smoking use included cigarettes. He has a 75.00 pack-year smoking history. He has never used smokeless tobacco. He reports that he does not currently use alcohol. He reports current drug use. Drug: Oxycodone.  Allergies  Allergen Reactions   Celebrex [Celecoxib] Hives and Itching   Tape     Tears skin. Prefers paper tape Please    Family History   Problem Relation Age of Onset   Heart disease Father 74       Living   Coronary artery disease Father        CABG   Alzheimer's disease Mother 35       Deceased   Arthritis Mother    Aneurysm Brother    Stomach cancer Maternal Uncle    Brain cancer Maternal Aunt        x2   Obesity Daughter        Had Bypass Sx    Prior to Admission medications   Medication Sig Start Date End Date Taking? Authorizing Provider  acetaminophen (TYLENOL) 500 MG tablet Take by mouth.    [provider]  albuterol (VENTOLIN HFA) 108 (90 Base) MCG/ACT inhaler Inhale 2 puffs into the lungs every 6 (six) hours as needed for wheezing or shortness of breath. 01/24/21   Haydee Salter, MD  alendronate (FOSAMAX) 70 MG tablet Take 1 tablet (70 mg total) by mouth once a week. Take with a full glass of water on an empty stomach. Patient taking differently: Take 70 mg by mouth every Tuesday. Take with a full glass of water on an empty stomach. 01/26/21   Bo Merino, MD  allopurinol (ZYLOPRIM) 100 MG tablet TAKE 2 TABLETS(200 MG) BY MOUTH DAILY 12/12/20   Ofilia Neas, PA-C  amLODipine (NORVASC) 10 MG tablet TAKE 1 TABLET(10 MG) BY MOUTH DAILY 06/26/21   Larey Dresser, MD  aspirin EC 81 MG tablet Take 81 mg by mouth daily. Swallow whole.    [provider]  atorvastatin (LIPITOR) 80 MG tablet Take 1 tablet (80 mg total) by mouth daily. 03/21/21   Larey Dresser, MD  carvedilol (COREG) 12.5 MG tablet Take 1 tablet (12.5 mg total) by mouth 2 (two) times daily. 03/24/21 03/24/22  Larey Dresser, MD  Cholecalciferol 25 MCG (1000 UT) tablet Take by mouth.    [provider]  citalopram (CELEXA) 20 MG tablet TAKE 1 TABLET(20 MG) BY MOUTH DAILY 01/20/21   Lovorn, Jinny Blossom, MD  clopidogrel (PLAVIX) 75 MG tablet Take 75 mg by mouth daily.    [provider]  doxycycline (VIBRA-TABS) 100 MG tablet Take 1 tablet (100 mg total) by mouth 2 (two) times daily. 06/06/21   Haydee Salter, MD   ezetimibe (ZETIA) 10 MG tablet Take 1 tablet (10 mg total) by mouth daily. 05/25/21 05/25/22  Larey Dresser, MD  fluticasone (FLONASE) 50 MCG/ACT nasal spray SHAKE LIQUID AND USE 2 SPRAYS IN EACH NOSTRIL DAILY AS NEEDED FOR RHINITIS OR ALLERGIES 11/14/20   Haydee Salter, MD  furosemide (LASIX) 20 MG tablet  Take 20 mg by mouth daily.    [provider]  gabapentin (NEURONTIN) 600 MG tablet Take 1 tablet (600 mg total) by mouth 2 (two) times daily. X 1 week, then 600 mg TID- for nerve/back pain 06/02/21   Lovorn, Jinny Blossom, MD  hydrOXYzine (VISTARIL) 25 MG capsule Take 1 capsule (25 mg total) by mouth every 8 (eight) hours as needed. 06/23/21   Haydee Salter, MD  ipratropium (ATROVENT) 0.03 % nasal spray Place 2 sprays into both nostrils every 12 (twelve) hours. Patient taking differently: Place 2 sprays into both nostrils 2 (two) times daily as needed for rhinitis. 01/24/21   Haydee Salter, MD  lisinopril (ZESTRIL) 20 MG tablet Take 20 mg by mouth daily.    [provider]  ondansetron (ZOFRAN-ODT) 4 MG disintegrating tablet Take 1 tablet (4 mg total) by mouth every 8 (eight) hours as needed for nausea or vomiting. 06/20/21   Haydee Salter, MD  Oxycodone HCl 10 MG TABS Take 1 tablet (10 mg total) by mouth every 6 (six) hours as needed. 05/30/21   Lovorn, Jinny Blossom, MD  potassium chloride (KLOR-CON) 10 MEQ tablet Take 2 tablets (20 mEq total) by mouth 2 (two) times daily. 05/01/21   [place], Maricela Bo, FNP  predniSONE (DELTASONE) 1 MG tablet TAKE 3 TABLETS BY MOUTH DAILY ALONG WITH 5 MG FOR TOTAL OF 8 MG DAILY FOR 2 MONTHS THEN REDUCE TO 7 MG BY MOUTH DAILY 05/22/21   Ofilia Neas, PA-C  predniSONE (DELTASONE) 20 MG tablet Take 2 tablets (40 mg total) by mouth daily with breakfast. 06/06/21   Haydee Salter, MD  predniSONE (DELTASONE) 5 MG tablet Take 1 tablet (5 mg total) by mouth daily with breakfast. 05/18/21   Bo Merino, MD  pregabalin (LYRICA) 50 MG capsule Take 50 mg by mouth  daily.    [provider]  tamsulosin (FLOMAX) 0.4 MG CAPS capsule Take 0.4 mg by mouth in the morning and at bedtime. 12/23/18   [provider]  Tiotropium Bromide Monohydrate (SPIRIVA RESPIMAT) 2.5 MCG/ACT AERS Inhale 2 puffs into the lungs daily. Patient taking differently: Inhale 2 puffs into the lungs daily as needed (shortness of breath). 01/24/21   Haydee Salter, MD  traMADol (ULTRAM) 50 MG tablet Take 2 tablets (100 mg total) by mouth 2 (two) times daily. 06/02/21   Courtney Heys, MD    Physical Exam: Vitals:   06/26/21 1000 06/26/21 1008 06/26/21 1032 06/26/21 1055  BP:      Pulse: 69  65 66  Resp: '18  16 14  '$ Temp:      SpO2:  92% 97% 95%  Weight:      Height:       Constitutional: Elderly male who appears to be ill Eyes: PERRL, lids and conjunctivae normal ENMT: Mucous membranes are dry. Neck: normal, supple, no masses, no thyromegaly Respiratory: Mildly increased respiratory effort with O2 saturations currently maintained on 2 L nasal cannula oxygen. Cardiovascular: Regular rate and rhythm, no murmurs / rubs / gallops.  Abdomen: no tenderness, no masses palpated. No hepatosplenomegaly. Bowel sounds positive.  Musculoskeletal: no clubbing / cyanosis. No joint deformity upper and lower extremities. Good ROM, no contractures. Normal muscle tone.  Skin: multiple areas of bruising noted of the upper extremities Neurologic: CN 2-12 grossly intact.  Intermittent jerking of the upper and lower extremities appreciated. Psychiatric: Normal judgment and insight. Alert and oriented x person and place time.  Data Reviewed:  EKG reveals sinus rhythm  at 63 bpm with first-degree heart block.  Assessment and Plan: Acute renal failure Patient presents with creatinine 7.43 with BUN 86.  Creatinine previously had been 1.35 with BUN 41 on 5/16.  Renal ultrasound noted significant bladder distention without significant signs of hydronephrosis.  Patient had been bolused 1  L of lactated Ringer's but still reported no urine output.  Suspect patient's acute renal failure multifactorial in nature due to hypovolemia(recent nausea vomiting diarrhea with question of blood loss)  and/or obstruction. -Admit to a telemetry bed -Place coud Foley catheter for strict intake and output -Check urinalysis -Check urine sodium, urine creatinine, urine urea -Daily monitoring of BMP -Avoid nephrotoxic agents -Continue Flomax  Symptomatic anemia Acute.  Hemoglobin 9 g/dL but previously had been 11.8 g/dL on 5/16 and had been 10.9 g/dL on 04/10/2021.  Patient did not report any bleeding, but was noted to have low blood pressures. -Type and screen for possible need of blood prior -Check stool guaiac -Recheck H&H -Hold Plavix -NPO after midnight in case of need for procedure -Protonix IV 40 mg twice daily -Transfuse blood products as needed to maintain hemoglobin greater than 8 g/dL -Dr. Rush Landmark consulted,  will follow-up for further recommendations.  Acute metabolic encephalopathy myoclonic jerking Patient was noted to be altered from baseline with reports of jerking of the upper and lower extremities.  The level was noted to be within normal limits patient had reportedly stopped drinking alcohol 2 weeks ago unclear if symptoms are secondary to hyponatremia, kidney failure, and/or medication.  Review of office records note that gabapentin was increased to 600 mg up to 3 times daily due to severe back pain.  Question of possibility of gabapentin toxicity as a possible cause of some of patient's symptoms -Neurochecks -Delirium precautions -Hold gabapentin  Acute respiratory failure with hypoxiaCOPD O2 saturations were documented to drop as low as 89% for which patient was placed on 2 L of nasal cannula oxygen.  No significant wheezing noted on physical exam.  Chest x-ray did not note any signs of edema or concern for pneumonia.  At this time unclear if symptoms related to fluid  given or anemia. -Continuous pulse oximetry with oxygen to maintain O2 saturation greater than 92% -Continue to monitor and adjust IV fluids as needed -Breathing treatments as needed  Hyperkalemia Acute.  Potassium elevated at 6.  Patient has been given  lactated ringer IV fluids. -Continue to monitor and treat potassium as needed  Hyponatremia Acute.  On admission sodium was noted to be as low as 129.  Thought to be hypovolemic hyponatremia given recent issues with nausea, vomiting, and diarrhea now resolved.  Started on IV fluids. -Continue IV fluids as tolerated -Follow-up urine electrolytes -Adjust treatment as needed  Essential hypertension Systolic blood pressures noted to be as low as 73/59 on review of the comprehensive flowsheet. -Hold home blood pressure regimen  CAD Patient with complex/complicated cardiac history noted to have RCA with robust collaterals from LAD and 95% LAD stenosis status post PCI/arthrectomy on 04/27/2020. Home medication regimen appears to includes aspirin, Plavix, and statin. -Hold aspirin and/or Plavix  Chronic pain Patient has a chronic history of back pain.  Home medication regimen includes oxycodone 10 mg every 6 hours as needed for pain. -Continue oxycodone as needed for pain -Held gabapentin  Polymyalgia rheumatica  History of BPH -Resume Flomax although appears not recently prescribed.  History of esophageal stricture, hiatal hernia, and GERD -Protonix IV  Will need to reconcile medications once pharmacy review is  complete  DVT prophylaxis: SCDs due to concern for active bleeding. Advance Care Planning:   Code Status: Full Code   Consults: GI  Family Communication: Updated at bedside  Severity of Illness: The appropriate patient status for this patient is INPATIENT. Inpatient status is judged to be reasonable and necessary in order to provide the required intensity of service to ensure the patient's safety. The patient's  presenting symptoms, physical exam findings, and initial radiographic and laboratory data in the context of their chronic comorbidities is felt to place them at high risk for further clinical deterioration. Furthermore, it is not anticipated that the patient will be medically stable for discharge from the hospital within 2 midnights of admission.   * I certify that at the point of admission it is my clinical judgment that the patient will require inpatient hospital care spanning beyond 2 midnights from the point of admission due to high intensity of service, high risk for further deterioration and high frequency of surveillance required.*  Author: Norval Morton, MD 06/26/2021 11:39 AM  For on call review [email or web address].

## 2021-06-26 NOTE — ED Triage Notes (Signed)
Pt arrives via EMS from home. Pt c/o tremors in all extremities for the past couple of days. Pt states he recently stopped drinking alcohol. Last drink of alcohol was 2 weeks ago. Pt is alert and oriented to name, dob, location, was confused about the current  month and year.

## 2021-06-26 NOTE — ED Notes (Addendum)
Bladder scanned with 116m present. Completed 3 times, but difficult due to tremor.

## 2021-06-26 NOTE — ED Provider Notes (Signed)
[facility] Provider Note   CSN: [id number] Arrival date & time: 06/26/21  2952     History  Chief Complaint  Patient presents with   Tremors    Brandon [name] is a 81 y.o. male.  Patient is a 81 year old male with a history of hypertension, hyperlipidemia, TIA, CAD, aortic stenosis status post TAVR on Plavix and chronic low back pain on daily oxycodone and gabapentin who is presenting today from home for worsening jerking, mild confusion and generalized weakness.  Patient gives some history and his wife over the phone gives the rest of the history.  Patient last week had a bout of nausea vomiting diarrhea that he saw his doctor for on 06/20/2021.  At that time he got Zofran but had multiple days where he had poor oral intake.  He reports that has improved but over the last 2 to 3 days he has noticed significant jerking in his upper extremities that is gradually worsening.  He denies any pain, headache, shortness of breath, fever, abdominal pain, nausea vomiting or diarrhea at this time.  However he reports that even difficult for him to eat because his arms jerk and he drops the food or dumps his coffee out.  He initially felt that it was only in his upper extremities but now it seems to affect his lower extremities as well.  He also reports he is felt a bit confused.  Other than the Zofran he received last week for his nausea he has not started any new medications and per his wife he has been taking his other medications for quite some time and has had no changes.  She reports that he has been saying things that are off the wall which are really unusual for him and been confused in the last few days.  He did tell her that he felt like there was a hammer going off in his head but denies any headache today.  He sometimes reports it seems like there is something in his vision on the right side but denies any loss of vision.  She reports she is checked in on him at  night and his upper extremities have been jerking wildly which is unusual for him.  Also he has had multiple falls in the last 2 weeks initially when he was sick with the upper GI illness but then yesterday he fell due to having a hard time using a walker even because of the jerking.  He normally does not use assistance to walk.  The history is provided by the patient, the spouse and medical records.      Home Medications Prior to Admission medications   Medication Sig Start Date End Date Taking? Authorizing Provider  acetaminophen (TYLENOL) 500 MG tablet Take by mouth.    [provider]  albuterol (VENTOLIN HFA) 108 (90 Base) MCG/ACT inhaler Inhale 2 puffs into the lungs every 6 (six) hours as needed for wheezing or shortness of breath. 01/24/21   Haydee Salter, MD  alendronate (FOSAMAX) 70 MG tablet Take 1 tablet (70 mg total) by mouth once a week. Take with a full glass of water on an empty stomach. Patient taking differently: Take 70 mg by mouth every Tuesday. Take with a full glass of water on an empty stomach. 01/26/21   Bo Merino, MD  allopurinol (ZYLOPRIM) 100 MG tablet TAKE 2 TABLETS(200 MG) BY MOUTH DAILY 12/12/20   Ofilia Neas, PA-C  amLODipine (NORVASC) 10 MG tablet  TAKE 1 TABLET(10 MG) BY MOUTH DAILY 06/26/21   Larey Dresser, MD  aspirin EC 81 MG tablet Take 81 mg by mouth daily. Swallow whole.    [provider]  atorvastatin (LIPITOR) 80 MG tablet Take 1 tablet (80 mg total) by mouth daily. 03/21/21   Larey Dresser, MD  carvedilol (COREG) 12.5 MG tablet Take 1 tablet (12.5 mg total) by mouth 2 (two) times daily. 03/24/21 03/24/22  Larey Dresser, MD  Cholecalciferol 25 MCG (1000 UT) tablet Take by mouth.    [provider]  citalopram (CELEXA) 20 MG tablet TAKE 1 TABLET(20 MG) BY MOUTH DAILY 01/20/21   Lovorn, Jinny Blossom, MD  clopidogrel (PLAVIX) 75 MG tablet Take 75 mg by mouth daily.    [provider]  doxycycline (VIBRA-TABS) 100  MG tablet Take 1 tablet (100 mg total) by mouth 2 (two) times daily. 06/06/21   Haydee Salter, MD  ezetimibe (ZETIA) 10 MG tablet Take 1 tablet (10 mg total) by mouth daily. 05/25/21 05/25/22  Larey Dresser, MD  fluticasone (FLONASE) 50 MCG/ACT nasal spray SHAKE LIQUID AND USE 2 SPRAYS IN EACH NOSTRIL DAILY AS NEEDED FOR RHINITIS OR ALLERGIES 11/14/20   Haydee Salter, MD  furosemide (LASIX) 20 MG tablet Take 20 mg by mouth daily.    [provider]  gabapentin (NEURONTIN) 600 MG tablet Take 1 tablet (600 mg total) by mouth 2 (two) times daily. X 1 week, then 600 mg TID- for nerve/back pain 06/02/21   Lovorn, Jinny Blossom, MD  hydrOXYzine (VISTARIL) 25 MG capsule Take 1 capsule (25 mg total) by mouth every 8 (eight) hours as needed. 06/23/21   Haydee Salter, MD  ipratropium (ATROVENT) 0.03 % nasal spray Place 2 sprays into both nostrils every 12 (twelve) hours. Patient taking differently: Place 2 sprays into both nostrils 2 (two) times daily as needed for rhinitis. 01/24/21   Haydee Salter, MD  lisinopril (ZESTRIL) 20 MG tablet Take 20 mg by mouth daily.    [provider]  ondansetron (ZOFRAN-ODT) 4 MG disintegrating tablet Take 1 tablet (4 mg total) by mouth every 8 (eight) hours as needed for nausea or vomiting. 06/20/21   Haydee Salter, MD  Oxycodone HCl 10 MG TABS Take 1 tablet (10 mg total) by mouth every 6 (six) hours as needed. 05/30/21   Lovorn, Jinny Blossom, MD  potassium chloride (KLOR-CON) 10 MEQ tablet Take 2 tablets (20 mEq total) by mouth 2 (two) times daily. 05/01/21   [place], Maricela Bo, FNP  predniSONE (DELTASONE) 1 MG tablet TAKE 3 TABLETS BY MOUTH DAILY ALONG WITH 5 MG FOR TOTAL OF 8 MG DAILY FOR 2 MONTHS THEN REDUCE TO 7 MG BY MOUTH DAILY 05/22/21   Ofilia Neas, PA-C  predniSONE (DELTASONE) 20 MG tablet Take 2 tablets (40 mg total) by mouth daily with breakfast. 06/06/21   Haydee Salter, MD  predniSONE (DELTASONE) 5 MG tablet Take 1 tablet (5 mg total) by mouth daily with  breakfast. 05/18/21   Bo Merino, MD  pregabalin (LYRICA) 50 MG capsule Take 50 mg by mouth daily.    [provider]  tamsulosin (FLOMAX) 0.4 MG CAPS capsule Take 0.4 mg by mouth in the morning and at bedtime. 12/23/18   [provider]  Tiotropium Bromide Monohydrate (SPIRIVA RESPIMAT) 2.5 MCG/ACT AERS Inhale 2 puffs into the lungs daily. Patient taking differently: Inhale 2 puffs into the lungs daily as needed (shortness of breath). 01/24/21   Arlester Marker  M, MD  traMADol (ULTRAM) 50 MG tablet Take 2 tablets (100 mg total) by mouth 2 (two) times daily. 06/02/21   Lovorn, Jinny Blossom, MD      Allergies    Celebrex [celecoxib] and Tape    Review of Systems   Review of Systems  Physical Exam Updated Vital Signs BP 118/68 (BP Location: Right Arm)   Pulse 65   Temp 98.6 F (37 C)   Resp 16   Ht 5' 6.5" (1.689 m)   Wt 74.8 kg   SpO2 97%   BMI 26.23 kg/m  Physical Exam Vitals and nursing note reviewed.  Constitutional:      General: He is not in acute distress.    Appearance: He is well-developed.  HENT:     Head: Normocephalic and atraumatic.     Mouth/Throat:     Mouth: Mucous membranes are dry.  Eyes:     Conjunctiva/sclera: Conjunctivae normal.     Pupils: Pupils are equal, round, and reactive to light.  Cardiovascular:     Rate and Rhythm: Normal rate and regular rhythm.     Heart sounds: No murmur heard. Pulmonary:     Effort: Pulmonary effort is normal. No respiratory distress.     Breath sounds: Normal breath sounds. No wheezing or rales.  Abdominal:     General: There is no distension.     Palpations: Abdomen is soft.     Tenderness: There is no abdominal tenderness. There is no guarding or rebound.  Musculoskeletal:        General: No tenderness. Normal range of motion.     Cervical back: Normal range of motion and neck supple.     Comments: Various areas of ecchymosis and stages of healing.  Abrasions to the upper extremities  Skin:     General: Skin is warm and dry.     Findings: No erythema or rash.  Neurological:     Mental Status: He is alert.     Motor: No weakness or pronator drift.     Comments: Oriented to person and place however says inappropriate or unintelligible things at times.  At times has to be redirected because has difficulty staying on task.  Normal heel-to-shin testing bilaterally except for involuntary jerking which is present in the upper extremities greater than the lower extremities.  Does not seem to be related to intention does seem to be more present at rest.  Psychiatric:        Behavior: Behavior normal.    ED Results / Procedures / Treatments   Labs (all labs ordered are listed, but only abnormal results are displayed) Labs Reviewed  CBC WITH DIFFERENTIAL/PLATELET - Abnormal; Notable for the following components:      Result Value   RBC 3.61 (*)    Hemoglobin 9.0 (*)    HCT 29.5 (*)    MCH 24.9 (*)    RDW 17.0 (*)    Platelets 125 (*)    All other components within normal limits  COMPREHENSIVE METABOLIC PANEL - Abnormal; Notable for the following components:   Sodium 129 (*)    Potassium 6.0 (*)    CO2 20 (*)    BUN 86 (*)    Creatinine, Ser 7.43 (*)    Calcium 8.8 (*)    Total Protein 6.0 (*)    Albumin 3.2 (*)    AST 13 (*)    GFR, Estimated 7 (*)    All other components within normal limits  MAGNESIUM  AMMONIA  URINALYSIS, ROUTINE W REFLEX MICROSCOPIC    EKG EKG Interpretation  Date/Time:  Monday Jun 26 2021 08:40:47 EDT Ventricular Rate:  63 PR Interval:  209 QRS Duration: 98 QT Interval:  380 QTC Calculation: 389 R Axis:   79 Text Interpretation: Sinus rhythm No significant change since last tracing Confirmed by Blanchie Dessert [phone]) on 06/26/2021 8:42:58 AM  Radiology CT Head Wo Contrast  Result Date: 06/26/2021 CLINICAL DATA:  Mental status change, unknown cause is EXAM: CT HEAD WITHOUT CONTRAST TECHNIQUE: Contiguous axial images were obtained from the  base of the skull through the vertex without intravenous contrast. RADIATION DOSE REDUCTION: This exam was performed according to the departmental dose-optimization program which includes automated exposure control, adjustment of the mA and/or kV according to patient size and/or use of iterative reconstruction technique. COMPARISON:  None Available. FINDINGS: Brain: No acute intracranial hemorrhage. No focal mass lesion. No CT evidence of acute infarction. No midline shift or mass effect. No hydrocephalus. Basilar cisterns are patent. There are periventricular and subcortical white matter hypodensities. Generalized cortical atrophy. Vascular: No hyperdense vessel or unexpected calcification. Skull: Normal. Negative for fracture or focal lesion. Sinuses/Orbits: Paranasal sinuses and mastoid air cells are clear. Orbits are clear. Other: None. IMPRESSION: No acute intracranial findings. Electronically Signed   By: Suzy Bouchard M.D.   On: 06/26/2021 09:33   DG Chest Port 1 View  Result Date: 06/26/2021 CLINICAL DATA:  Weakness. EXAM: PORTABLE CHEST 1 VIEW COMPARISON:  03/03/2021 FINDINGS: 0933 hours. Low volume film. Streaky opacity in the lung bases suggest atelectasis. Cardiopericardial silhouette is at upper limits of normal for size. No pulmonary edema or focal airspace consolidation. No substantial pleural effusion. Telemetry leads overlie the chest. IMPRESSION: Low volume film with basilar atelectasis. Electronically Signed   By: Misty Stanley M.D.   On: 06/26/2021 09:46    Procedures Procedures    Medications Ordered in ED Medications  lactated ringers infusion ( Intravenous New Bag/Given 06/26/21 0928)  lactated ringers bolus 1,000 mL (1,000 mLs Intravenous New Bag/Given 06/26/21 1031)    ED Course/ Medical Decision Making/ A&P                           Medical Decision Making Amount and/or Complexity of Data Reviewed Independent Historian: spouse and EMS External Data Reviewed:  notes. Labs: ordered. Decision-making details documented in ED Course. Radiology: ordered and independent interpretation performed. Decision-making details documented in ED Course. ECG/medicine tests: ordered and independent interpretation performed. Decision-making details documented in ED Course.  Risk Prescription drug management. Decision regarding hospitalization.  Pt with multiple medical problems and comorbidities and presenting today with a complaint that caries a high risk for morbidity and mortality. Today with involuntary jerking which is worsened over the last few days in addition to confusion most classic for delirium.  Patient did have a GI illness last week but that has resolved.  His vital signs are reassuring today.  There is been no new medications initiated.  Patient has had multiple falls concern for possible intracranial hemorrhage versus electrolyte abnormality.  Lower suspicion for acute infectious process as patient has been afebrile here and at home does not have cough or respiratory symptoms and has no abdominal pain or evidence of cellulitis.  Patient given IV fluids.  I independently interpreted patient's EKG today which shows a normal sinus rhythm without acute findings.  Low suspicion for serotonin syndrome at this time.  Labs and imaging are pending.  10:38 AM I independently interpreted patient's labs today which show a normal magnesium, ammonia.  CBC with hemoglobin of 9 from 11 6 days ago and normal white count.  CMP showing new acute renal failure with a creatinine of 7.43 from 1.356 days ago, BUN of 86, potassium of 6.0 and sodium of 129.  Patient's calcium is 8.8 which is similar to 1 month ago.  Patient has no history of kidney disease.  Suspect due to combination of dehydration from nausea vomiting and poor oral intake last week and chronic medications.  Patient will need admission for further care.  He has no peaked T waves or dysrhythmia on EKG today.  We will  continue hydration.  Consult to the hospitalist for further care.  Findings were discussed with the patient and his wife.  I have independently visualized and interpreted pt's images today.  Head CT without evidence of intracranial hemorrhage or stroke.  Chest x-ray without acute findings.  Radiology reports no acute findings.             Final Clinical Impression(s) / ED Diagnoses Final diagnoses:  Metabolic encephalopathy  Acute renal failure, unspecified acute renal failure type ([place])  Uremia    Rx / DC Orders ED Discharge Orders     None         Blanchie Dessert, MD 06/26/21 1039

## 2021-06-26 NOTE — Telephone Encounter (Signed)
PA for Hydroxizine approved from 06/23/2021 through 06/24/2022.  Pharmacy notified [facility] phone. Dm/cma

## 2021-06-27 ENCOUNTER — Inpatient Hospital Stay (HOSPITAL_COMMUNITY): Payer: Medicare Other

## 2021-06-27 ENCOUNTER — Ambulatory Visit: Payer: Medicare Other | Admitting: Family Medicine

## 2021-06-27 DIAGNOSIS — G9341 Metabolic encephalopathy: Secondary | ICD-10-CM | POA: Diagnosis not present

## 2021-06-27 DIAGNOSIS — N19 Unspecified kidney failure: Secondary | ICD-10-CM

## 2021-06-27 DIAGNOSIS — J9601 Acute respiratory failure with hypoxia: Secondary | ICD-10-CM | POA: Diagnosis not present

## 2021-06-27 DIAGNOSIS — G894 Chronic pain syndrome: Secondary | ICD-10-CM | POA: Diagnosis not present

## 2021-06-27 DIAGNOSIS — Z7189 Other specified counseling: Secondary | ICD-10-CM

## 2021-06-27 DIAGNOSIS — N179 Acute kidney failure, unspecified: Secondary | ICD-10-CM | POA: Diagnosis not present

## 2021-06-27 DIAGNOSIS — G253 Myoclonus: Secondary | ICD-10-CM | POA: Diagnosis not present

## 2021-06-27 DIAGNOSIS — E871 Hypo-osmolality and hyponatremia: Secondary | ICD-10-CM | POA: Diagnosis not present

## 2021-06-27 DIAGNOSIS — E875 Hyperkalemia: Secondary | ICD-10-CM | POA: Diagnosis not present

## 2021-06-27 LAB — BASIC METABOLIC PANEL
Anion gap: 8 (ref 5–15)
Anion gap: 6 (ref 5–15)
BUN: 73 mg/dL — ABNORMAL HIGH (ref 8–23)
BUN: 56 mg/dL — ABNORMAL HIGH (ref 8–23)
CO2: 21 mmol/L — ABNORMAL LOW (ref 22–32)
CO2: 22 mmol/L (ref 22–32)
Calcium: 8.8 mg/dL — ABNORMAL LOW (ref 8.9–10.3)
Calcium: 8.7 mg/dL — ABNORMAL LOW (ref 8.9–10.3)
Chloride: 101 mmol/L (ref 98–111)
Chloride: 108 mmol/L (ref 98–111)
Creatinine, Ser: 3.92 mg/dL — ABNORMAL HIGH (ref 0.61–1.24)
Creatinine, Ser: 1.81 mg/dL — ABNORMAL HIGH (ref 0.61–1.24)
GFR, Estimated: 15 mL/min — ABNORMAL LOW (ref >60)
GFR, Estimated: 38 mL/min — ABNORMAL LOW (ref >60)
Glucose, Bld: 87 mg/dL (ref 70–99)
Glucose, Bld: 89 mg/dL (ref 70–99)
Potassium: 5.7 mmol/L — ABNORMAL HIGH (ref 3.5–5.1)
Potassium: 5.6 mmol/L — ABNORMAL HIGH (ref 3.5–5.1)
Sodium: 130 mmol/L — ABNORMAL LOW (ref 135–145)
Sodium: 136 mmol/L (ref 135–145)

## 2021-06-27 LAB — CBC
HCT: 30.5 % — ABNORMAL LOW (ref 39.0–52.0)
Hemoglobin: 9.5 g/dL — ABNORMAL LOW (ref 13.0–17.0)
MCH: 25.2 pg — ABNORMAL LOW (ref 26.0–34.0)
MCHC: 31.1 g/dL (ref 30.0–36.0)
MCV: 80.9 fL (ref 80.0–100.0)
Platelets: 101 10*3/uL — ABNORMAL LOW (ref 150–400)
RBC: 3.77 MIL/uL — ABNORMAL LOW (ref 4.22–5.81)
RDW: 16.4 % — ABNORMAL HIGH (ref 11.5–15.5)
WBC: 5.2 10*3/uL (ref 4.0–10.5)
nRBC: 0 % (ref 0.0–0.2)

## 2021-06-27 LAB — LACTATE DEHYDROGENASE: LDH: 169 U/L (ref 98–192)

## 2021-06-27 LAB — OSMOLALITY, URINE: Osmolality, Ur: 302 mOsm/kg (ref 300–900)

## 2021-06-27 MED ORDER — CYANOCOBALAMIN 1000 MCG/ML IJ SOLN
1000.0000 ug | INTRAMUSCULAR | Status: DC
  Administered 2021-06-27: 1000 ug via SUBCUTANEOUS
  Filled 2021-06-27: qty 1

## 2021-06-27 MED ORDER — ALBUTEROL SULFATE (2.5 MG/3ML) 0.083% IN NEBU: 2.5000 mg | INHALATION_SOLUTION | Freq: Four times a day (QID) | RESPIRATORY_TRACT | Status: DC | PRN

## 2021-06-27 MED ORDER — SODIUM ZIRCONIUM CYCLOSILICATE 10 G PO PACK
10.0000 g | PACK | Freq: Two times a day (BID) | ORAL | Status: AC
  Administered 2021-06-27 (×2): 10 g via ORAL
  Filled 2021-06-27 (×2): qty 1

## 2021-06-27 MED ORDER — EZETIMIBE 10 MG PO TABS
10.0000 mg | ORAL_TABLET | Freq: Every day | ORAL | Status: DC
Start: 2021-06-28 — End: 2021-06-29
  Administered 2021-06-28 – 2021-06-29 (×2): 10 mg via ORAL
  Filled 2021-06-27 (×2): qty 1

## 2021-06-27 MED ORDER — ATORVASTATIN CALCIUM 80 MG PO TABS
80.0000 mg | ORAL_TABLET | Freq: Every day | ORAL | Status: DC
  Administered 2021-06-27 – 2021-06-28 (×2): 80 mg via ORAL
  Filled 2021-06-27 (×2): qty 1

## 2021-06-27 MED ORDER — CARVEDILOL 12.5 MG PO TABS: 12.5000 mg | ORAL_TABLET | Freq: Two times a day (BID) | ORAL | Status: DC

## 2021-06-27 MED ORDER — IPRATROPIUM BROMIDE 0.03 % NA SOLN: 2.0000 | Freq: Two times a day (BID) | NASAL | Status: DC | PRN

## 2021-06-27 MED ORDER — CYANOCOBALAMIN 1000 MCG/ML IJ SOLN: 1000.0000 ug | INTRAMUSCULAR | Status: DC

## 2021-06-27 MED ORDER — CHLORHEXIDINE GLUCONATE CLOTH 2 % EX PADS
6.0000 | MEDICATED_PAD | Freq: Every day | CUTANEOUS | Status: DC
  Administered 2021-06-27 – 2021-06-28 (×2): 6 via TOPICAL

## 2021-06-27 MED ORDER — PHENOL 1.4 % MT LIQD: 1.0000 | OROMUCOSAL | Status: DC | PRN

## 2021-06-27 MED ORDER — ALBUTEROL SULFATE HFA 108 (90 BASE) MCG/ACT IN AERS: 2.0000 | INHALATION_SPRAY | Freq: Four times a day (QID) | RESPIRATORY_TRACT | Status: DC | PRN

## 2021-06-27 MED ORDER — FLUTICASONE PROPIONATE 50 MCG/ACT NA SUSP
2.0000 | Freq: Every day | NASAL | Status: DC | PRN
  Filled 2021-06-27: qty 16

## 2021-06-27 MED ORDER — IPRATROPIUM BROMIDE 0.06 % NA SOLN
2.0000 | Freq: Two times a day (BID) | NASAL | Status: DC | PRN
  Filled 2021-06-27: qty 15

## 2021-06-27 MED ORDER — PANTOPRAZOLE SODIUM 40 MG PO TBEC
40.0000 mg | DELAYED_RELEASE_TABLET | Freq: Every day | ORAL | Status: DC
  Administered 2021-06-28 – 2021-06-29 (×2): 40 mg via ORAL
  Filled 2021-06-27 (×2): qty 1

## 2021-06-27 MED ORDER — TAMSULOSIN HCL 0.4 MG PO CAPS
0.4000 mg | ORAL_CAPSULE | Freq: Every day | ORAL | Status: DC
Start: 2021-06-27 — End: 2021-06-29
  Administered 2021-06-27 – 2021-06-28 (×2): 0.4 mg via ORAL
  Filled 2021-06-27 (×2): qty 1

## 2021-06-27 NOTE — Progress Notes (Signed)
[facility]                                                                               Brandon [name], is a 81 y.o. male, DOB - 01/16/1941, NTI:[id number] Admit date - 06/26/2021    Outpatient Primary MD for the patient is [name], [name], MD  LOS - 1  days    Brief summary   Brandon [name] is a 81 y.o. male with medical history significant of hypertension, hyperlipidemia, chronic diastolic CHF, CAD, aortic stenosis s/p TAVR on Plavix, TIA, COPD, and chronic back pain who presents with complaints of jerking all over, symptomatic anemia and acute encephalopathy. Patient had several falls in the last 2 weeks.   CT scan of the head did not note any acute abnormalities.  Chest x-ray noted low lung volumes with basilar atelectasis.   Assessment & Plan    Assessment and Plan:  Acute renal failure with Hyperkalemia  Improving. His baseline creatinine is 1.35.  Admitted with a creatinine of 7.43, probably secondary to nausea, vomiting and diarrhea in the last one week.  US renal ruled out obstruction/ hydronephrosis.  Patient has a h/o urinary retention, and currently has a foley catheter to monitor urine output.  Continue with foley for another 24 hours.  Continue with IV fluids for another 24 hours and recheck renal parameters in am.  Avoid nephrotoxic agents. UA IS Neg for infection.  Continue with flomax.     Hyperkalemia:  Improved to 5. 7 today.  Recheck EKG today.  Ordered Lokelma BID.  1 gm calcium gluconate ordered last night.    Acute myoclonic jerking  ? Gabapentin toxicity vs from AKI.  Neurology will be consulted for further evaluation.    Acute metabolic encephalopathy;  From AKI.  Improving, but not back to baseline.  CT head without contrast, no acute findings.  MRI brain without contrast ordered as pt 's wife says he has been more confused lately and falling more frequently than usual.  Therapy evaluations ordered.     Copd/ Acute respiratory  failure with hypoxia on 3 lit of [place] oxygen.  Check CXR in am.  No wheezing on exam.  Resume bronchodilators as needed.   Symptomatic anemia:  Baseline hemoglobin around 11.8, presented with a hemoglobin of 9 today.  Anemia panel and stool for occult blood ordered.  PPI IV BID.  GI on board.  Transfuse to keep hemoglobin greater than 8.     H/o esophageal stricture, Hiatal hernia. GERD: Stable. Continue with PPI.   Hypertension:  Optimal BP parameters. Patient admitted with hypotension . Continue to hold the coreg. Today.    Hyponatremia:  Suspect from dehydration and AKI.  Recheck in am.    CAD:  Patient with complex/complicated cardiac history noted to have RCA with robust collaterals from LAD and 95% LAD stenosis status post PCI/arthrectomy on 04/27/2020.  Asprin and plavix on hold till GI bleed is ruled out.    Chronic pain with multiple falls:  Hold gabapentin.  Estimated body mass index is 26.23 kg/m as calculated from the following:   Height as of this encounter: 5' 6.5" (1.689 m).   Weight  as of this encounter: 74.8 kg.  Code Status: full code.  DVT Prophylaxis:  SCDs Start: 06/26/21 1201   Level of Care: Level of care: Telemetry Medical Family Communication: wife at bedside.   Disposition Plan:     Remains inpatient appropriate:  myoclonic jerking.   Procedures:  MRI brain without contrast.   Consultants:   Gastroenterology Neurology  Palliative care.   Antimicrobials:   None.   Medications  Scheduled Meds:  Chlorhexidine Gluconate Cloth  6 each Topical Q0600   cyanocobalamin  1,000 mcg Subcutaneous Weekly   pantoprazole (PROTONIX) IV  40 mg Intravenous Q12H   sodium chloride flush  3 mL Intravenous Q12H   sodium zirconium cyclosilicate  10 g Oral BID   Continuous Infusions:  lactated ringers 100 mL/hr at 06/27/21 0453   PRN Meds:.acetaminophen **OR** acetaminophen, albuterol, ondansetron **OR** ondansetron (ZOFRAN) IV, oxyCODONE,  phenol    Subjective:   Domonique Brouillard was seen and examined today.  Appears comfortable, still with myoclonic jerking.  Alert, oriented to person only.   Objective:   Vitals:   06/26/21 2255 06/27/21 0142 06/27/21 0447 06/27/21 0724  BP: 102/70 110/74 131/61 (!) 118/55  Pulse: 73 73 76 77  Resp: '18 18 18 17  '$ Temp: 98.8 F (37.1 C) 98.7 F (37.1 C) 98.8 F (37.1 C) 98.5 F (36.9 C)  TempSrc: Oral Oral Oral Oral  SpO2: 100% 100% 99% 97%  Weight:      Height:        Intake/Output Summary (Last 24 hours) at 06/27/2021 1216 Last data filed at 06/27/2021 0447 Gross per 24 hour  Intake 635.17 ml  Output 1375 ml  Net -739.83 ml   Filed Weights   06/26/21 0844  Weight: 74.8 kg     Exam General exam: Appears calm and comfortable  Respiratory system: diminished air entry at bases.  Cardiovascular system: S1 & S2 heard, RRR. No JVD,  No pedal edema. Gastrointestinal system: Abdomen is nondistended, soft and nontender.  Normal bowel sounds heard. Central nervous system: Alert and oriented to self only. Able to move all extremities.  Extremities: no pedal edema.  Skin: No rashes seen.  Psychiatry: confused.    Data Reviewed:  I have personally reviewed following labs and imaging studies   CBC Lab Results  Component Value Date   WBC 5.2 06/27/2021   RBC 3.77 (L) 06/27/2021   HGB 9.5 (L) 06/27/2021   HCT 30.5 (L) 06/27/2021   MCV 80.9 06/27/2021   MCH 25.2 (L) 06/27/2021   PLT 101 (L) 06/27/2021   MCHC 31.1 06/27/2021   RDW 16.4 (H) 06/27/2021   LYMPHSABS 1.0 06/26/2021   MONOABS 0.6 06/26/2021   EOSABS 0.3 06/26/2021   BASOSABS 0.0 67/89/3810     Last metabolic panel Lab Results  Component Value Date   NA 130 (L) 06/27/2021   K 5.7 (H) 06/27/2021   CL 101 06/27/2021   CO2 21 (L) 06/27/2021   BUN 73 (H) 06/27/2021   CREATININE 3.92 (H) 06/27/2021   GLUCOSE 87 06/27/2021   GFRNONAA 15 (L) 06/27/2021   GFRAA 107 08/11/2020   CALCIUM 8.8 (L) 06/27/2021    PHOS 6.6 (H) 06/26/2021   PROT 6.0 (L) 06/26/2021   ALBUMIN 3.2 (L) 06/26/2021   BILITOT 0.6 06/26/2021   ALKPHOS 103 06/26/2021   AST 13 (L) 06/26/2021   ALT 13 06/26/2021   ANIONGAP 8 06/27/2021    CBG (last 3)  No results for input(s): GLUCAP in the last 72 hours.  Coagulation Profile: No results for input(s): INR, PROTIME in the last 168 hours.   Radiology Studies: CT Head Wo Contrast  Result Date: 06/26/2021 CLINICAL DATA:  Mental status change, unknown cause is EXAM: CT HEAD WITHOUT CONTRAST TECHNIQUE: Contiguous axial images were obtained from the base of the skull through the vertex without intravenous contrast. RADIATION DOSE REDUCTION: This exam was performed according to the departmental dose-optimization program which includes automated exposure control, adjustment of the mA and/or kV according to patient size and/or use of iterative reconstruction technique. COMPARISON:  None Available. FINDINGS: Brain: No acute intracranial hemorrhage. No focal mass lesion. No CT evidence of acute infarction. No midline shift or mass effect. No hydrocephalus. Basilar cisterns are patent. There are periventricular and subcortical white matter hypodensities. Generalized cortical atrophy. Vascular: No hyperdense vessel or unexpected calcification. Skull: Normal. Negative for fracture or focal lesion. Sinuses/Orbits: Paranasal sinuses and mastoid air cells are clear. Orbits are clear. Other: None. IMPRESSION: No acute intracranial findings. Electronically Signed   By: Suzy Bouchard M.D.   On: 06/26/2021 09:33   US RENAL  Result Date: 06/26/2021 CLINICAL DATA:  Acute renal dysfunction EXAM: RENAL / URINARY TRACT ULTRASOUND COMPLETE COMPARISON:  CT done on 03/24/2021 and abdominal sonogram done on 03/17/2020 FINDINGS: Right Kidney: Renal measurements: 12.7 x 6.5 x 5.2 cm = volume: 226.1 mL. Echogenicity within normal limits. No mass or hydronephrosis visualized. Left Kidney: Renal measurements:  12.9 x 6.4 x 5.2 cm = volume: 224.8 mL. Echogenicity within normal limits. No mass or hydronephrosis visualized. Bladder: Appears normal for degree of bladder distention. Other: None. IMPRESSION: No sonographic abnormality is seen in the kidneys. There is no hydronephrosis. Electronically Signed   By: Elmer Picker M.D.   On: 06/26/2021 13:27   DG Chest Port 1 View  Result Date: 06/26/2021 CLINICAL DATA:  Weakness. EXAM: PORTABLE CHEST 1 VIEW COMPARISON:  03/03/2021 FINDINGS: 0933 hours. Low volume film. Streaky opacity in the lung bases suggest atelectasis. Cardiopericardial silhouette is at upper limits of normal for size. No pulmonary edema or focal airspace consolidation. No substantial pleural effusion. Telemetry leads overlie the chest. IMPRESSION: Low volume film with basilar atelectasis. Electronically Signed   By: Misty Stanley M.D.   On: 06/26/2021 09:46       Hosie Poisson M.D. [facility] 06/27/2021, 12:16 PM  Available via Epic secure chat 7am-7pm After 7 pm, please refer to night coverage provider listed on amion.

## 2021-06-27 NOTE — Progress Notes (Signed)
Stat  EEG complete - results pending.  

## 2021-06-27 NOTE — TOC Initial Note (Signed)
Transition of Care [facility]) - Initial/Assessment Note    Patient Details  Name: Brandon [name] [name] MRN: [id number] Date of Birth: 11-25-1940  Transition of Care Brandon [name] [name]) CM/SW Contact:    Brandon Favre, RN Phone Number: 06/27/2021, 11:18 AM  Clinical Narrative:                  Spoke to patient and his wife Brandon [name] at bedside. Confirmed face sheet information.   PCP is Brandon [name] [name].  They have no transportation problems .   [facility] is [facility] and [address] . Discussed Brandon [name] would deliver medications to bedside prior to discharge, they prefer Brandon [name] [name].   Patient has a bench in shower, walker and a cane.   Patient currently on oxygen, does not have oxygen at home. If needed will need ambulation oxygen saturation note and order      Expected Discharge Plan: Home/Self Care Barriers to Discharge: Continued Medical Work up   Patient Goals and CMS Choice Patient states their goals for this hospitalization and ongoing recovery are:: to return to home      Expected Discharge Plan and Services Expected Discharge Plan: Home/Self Care       Living arrangements for the past 2 months: Single Family Home                 DME Arranged: N/A DME Agency: NA       HH Arranged: NA          Prior Living Arrangements/Services Living arrangements for the past 2 months: Single Family Home Lives with:: Spouse Patient language and need for interpreter reviewed:: Yes Do you feel safe going back to the place where you live?: Yes      Need for Family Participation in Patient Care: Yes (Comment) Care giver support system in place?: Yes (comment) Current home services: DME Criminal Activity/Legal Involvement Pertinent to Current Situation/Hospitalization: No - Comment as needed  Activities of Daily Living      Permission Sought/Granted   Permission granted to share information with : Yes, Verbal Permission Granted  Share Information with NAME: wife Brandon [name] [phone]           Emotional Assessment Appearance:: Appears stated age Attitude/Demeanor/Rapport: Engaged Affect (typically observed): Accepting Orientation: : Oriented to Self, Oriented to Place, Oriented to  Time, Oriented to Situation Alcohol / Substance Use: Not Applicable Psych Involvement: No (comment)  Admission diagnosis:  Metabolic encephalopathy [I14.43] Uremia [N19] ARF (acute renal failure) (HCC) [N17.9] Acute renal failure, unspecified acute renal failure type ([place]) [N17.9] Patient Active Problem List   Diagnosis Date Noted   ARF (acute renal failure) ([place]) 06/26/2021   Hyponatremia 06/26/2021   Hyperkalemia 15/40/0867   Acute metabolic encephalopathy 61/95/0932   Myoclonic jerking 06/26/2021   Lumbar postlaminectomy syndrome 05/03/2021   Degeneration of lumbar intervertebral disc 05/03/2021   Lumbar spondylosis 05/03/2021   History of hip fracture 04/24/2021   S/P abdominal aortic aneurysm repair 04/24/2021   Chronic allergic rhinitis 01/24/2021   Demand ischemia ([place])    Acute respiratory failure with hypoxia (HCC)    Elevated troponin    Chronic diastolic heart failure ([place]) 12/03/2020   Steatosis of liver 11/01/2020   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   Hepatitis B core antibody positive 11/01/2020   Benign prostatic hyperplasia 11/01/2020   Chronic obstructive pulmonary disease ([place]) 07/20/2020   History of placement of stent in LAD coronary artery 07/20/2020   History of transcatheter aortic valve replacement (  TAVR) 06/07/2020   TIA (transient ischemic attack)    Esophageal stricture    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/2020   Primary osteoarthritis of both hands 01/11/2020   Piriformis syndrome of both sides 09/23/2019   Pain of both shoulder joints 07/29/2019   Sciatica associated with disorder of lumbar spine 04/24/2019   Primary osteoarthritis involving multiple joints 04/24/2019   Chronic pain syndrome  04/24/2019   Anxiety and depression 04/15/2018   Cancer of tonsillar fossa ([place]) 09/20/2017   Liver abscess 07/10/2016   Symptomatic anemia 07/10/2016   Essential hypertension 05/19/2013   Osteoarthritis 05/29/2009   Psoriasis 01/13/2009   Coronary artery disease 09/15/2008   Diverticulosis of colon 08/27/2007   History of benign prostatic hyperplasia 08/27/2007   Hyperlipidemia 03/19/2007   Gastroesophageal reflux disease 03/19/2007   PCP:  Brandon Salter, MD Pharmacy:   [name] ORDER) [place], [place] [address] Phone: [phone] Fax: [place], [address] [place] [address] Phone: [phone] Fax: [phone]     Social Determinants of Health (SDOH) Interventions    Readmission Risk Interventions     View : No data to display.

## 2021-06-27 NOTE — Consult Note (Addendum)
Referring Provider: Dr. Fuller [name]  Primary Care Physician:  Brandon Salter, MD Primary Gastroenterologist:  Dr.Malcolm Fuller [name]   Reason for Consultation:  Anemia   HPI: Brandon [name] is a 81 y.o. 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 Past cholecystectomy.  He was seen by his PCP Dr. Gena [name] on 06/20/2021 for further evaluation for N/V/D and upper abdominal pain x 3 days. No hematemesis, hematochezia or melena.  He recently completed a course of Prednisone and Doxycycline for bronchitis.  He was prescribed Zofran for N/V.  Labs were done at that time which showed a WBC count 8.7. Hemoglobin level of 11.8 (Hg level 10.9 on 04/10/2021).  Hematocrit 37.4.  MCV 77.2.  Platelet 180.  Sodium 134.  Creatinine 1.35 ( baseline Cr 0.80). BUN 41 (baseline BUN 15 - 22).  Alk phos 123.  Total bili 0.5.  AST 17.  ALT 17.  Lipase 10.  Urinalysis with 1+ protein.   He developed significant jerking in his upper extremities which progressively worsened over the past few days.  He had difficulty eating as he would drop his food due to having persistent jerking movements to his arms. He was transported to [facility] ED by EMS on 06/26/2021 further evaluation.  His wife  also reported he had several falls over the past 2 weeks at home.  Labs in the ED showed a WBC count of 6.5.  Hemoglobin 9.0 ( Hg 11.8 on 5/16).  Hematocrit 29.5.  MCV 81.7.  Platelet 125.  Sodium 129.  Potassium 6.0.  Glucose 94.  BUN 86.  Creatinine 7.43.  Total protein 6.  Albumin 3.2.  Alk phos 103.  Total bili 0.6.  AST 13.  ALT 13.  Magnesium 2.0 CK 138.  6.6.  Ammonia 10.  Labs 06/26/2021: Repeat hemoglobin 7.8.  Creatinine of 5.62.  Potassium 5.5.  He was transfused 1 unit of PRBCs.  Labs 06/27/2021: Post transfusion Hemoglobin 9.5.  Hematocrit 30.5.  Platelet 101.  A GI consult was requested for further evaluation regarding anemia in the setting of acute kidney failure.  [name] is a challenging historian, slightly confused at this time. The above history was obtained from the hospitalist admission note, Epic and care everywhere records and I spoke to the patient's wife via telephone. In review of his Epic records, he has a history of IDA dating back to  07/2016. Labs 07/10/2016: Iron 6.  TIBC 134.  Saturation ratios 4. Labs 12/04/2020: Iron 22.  Saturation ratio 7.  TIBC 301. Vitamin B12 151. He reported having heartburn for the past 1 1/2 weeks in association with his N/V/D illness.  He takes Tums as needed.  His emesis was nonbloody and diarrhea was nonbloody as verified by his wife. He had difficulty swallowing solid food in the past after he underwent surgery and radiation for his tonsillar cancer, which  mostly resolved after his esophagus was dilated 07/2018. No recent dysphagia. His wife stated his appetite significantly decreased over the past few weeks. He typically passes a normal brown formed stool daily.  No rectal bleeding or melena.  His last dose of Plavix was taken around 8 AM on Sunday, 06/25/2021.  He takes ASA 81 mg nightly.  He drinks 4 ounces of Baileys Irish cream at bedtime for the past year but not for the past 2 weeks.  He denies any history of alcohol use disorder.  He underwent an  EGD and colonoscopy by Dr. Fuller [name] 03/23/2015.  The EGD identified a stricture at the GE junction which was dilated and a small hiatal hernia.  The colonoscopy identified 2 small tubular adenomatous polyps and diverticulosis.  No further colon polyp surveillance colonoscopies were recommended due to age.  History of hepatic steatosis with abnormal liver enzymes, weakly positive ASAM and positive AMA levels followed by Brandon Locks NP at [facility]. He underwent a FibroScan which reported F0 fibrosis and S0 steatosis. History of positive hepatitis B core antibody total with negative hepatitis B surface antigen.  A liver biopsy was not warranted and he was advised to undergo noninvasive testing for fibrosis in 2 to 3 years.  Past Endoscopic Procedures:  He had tongue tethering / scar tissue after treatment and underwent scar tissue release and tongue repair with Maloney esophageal dilation on 07/07/18  EGD 03/23/2015: 1. Short stricture at the gastroesophageal junction; dilated savary dilators over guidewire 2. Small hiatal hernia  Colonoscopy 03/23/2015:  1. Two sessile polyps in the sigmoid colon and transverse colon; polypectomies performed with a cold snare 2. Moderate diverticulosis in the sigmoid colon and descending colon 3. Mild diverticulosis in the transverse colon 4. Grade l internal hemorrhoids TUBULAR ADENOMAS (X2). - NO HIGH GRADE DYSPLASIA OR INVASIVE MALIGNANCY IDENTIFIED.  Colonoscopy 10/21/2007: Diverticulosis transverse to sigmoid colon 5 mm polyp removed from the sigmoid colon  EGD 02/18/2006: Esophagus GL stricture dilated 3cm Hiatal hernia  EGD 01/24/2006: Food bolus impaction  Distal esophageal stricture 3cm hiatal hernia  Colonoscopy 08/11/1998: Diverticulosis Internal hemorrhoids  Past Medical History:  Diagnosis Date   Abdominal aneurysm ([place])    Arthritis    "all over" (07/19/2016)   BPH (benign prostatic hypertrophy)    CAD (coronary artery disease)     Chicken pox    Chronic lower back pain    s/p surgical fusion   Depression    Diverticulosis    Esophageal stricture    GERD (gastroesophageal reflux disease)    Hepatitis B 1984   Hiatal hernia    History of radiation therapy 01/16/18- 03/05/18   Left Tonsil, 66 Gy in 33 fractions to high risk nodal echelons.    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 Burt Knack and Dr Cyndia Bent    Severe aortic stenosis    TIA (transient ischemic attack) 1990s   hx   tonsillar ca dx'd 11/2017   Tubular adenoma of colon 2009    Past Surgical History:  Procedure Laterality Date   ABDOMINAL AORTIC ENDOVASCULAR STENT GRAFT N/A 03/03/2021   Procedure: ABDOMINAL AORTIC ENDOVASCULAR STENT GRAFT;  Surgeon: Waynetta Sandy, MD;  Location: [place];  Service: Vascular;  Laterality: N/A;   ABDOMINAL AORTOGRAM W/LOWER EXTREMITY N/A 04/10/2021  Procedure: ABDOMINAL AORTOGRAM W/LOWER EXTREMITY;  Surgeon: Waynetta Sandy, MD;  Location: [place] [facility];  Service: Cardiovascular;  Laterality: N/A;   BACK SURGERY     CARDIAC CATHETERIZATION     CATARACT EXTRACTION W/ INTRAOCULAR LENS  IMPLANT, BILATERAL Bilateral 01/2012 - 02/2012   CORONARY ATHERECTOMY N/A 04/27/2020   Procedure: CORONARY ATHERECTOMY;  Surgeon: Sherren Mocha, MD;  Location: [place] [facility];  Service: Cardiovascular;  Laterality: N/A;   CORONARY STENT INTERVENTION N/A 04/27/2020   Procedure: CORONARY STENT INTERVENTION;  Surgeon: Sherren Mocha, MD;  Location: [place] [facility];  Service: Cardiovascular;  Laterality: N/A;   DIRECT LARYNGOSCOPY Left 10/09/2017   Procedure: DIRECT LARYNGOSCOPY WITH BOPSY;  Surgeon: Jodi Marble, MD;  Location: [place];  Service: ENT;  Laterality: Left;   ESOPHAGOGASTRODUODENOSCOPY (EGD) WITH  ESOPHAGEAL DILATION     "couple times" (07/19/2016)   ESOPHAGOSCOPY Left 10/09/2017   Procedure: ESOPHAGOSCOPY;  Surgeon: Jodi Marble, MD;  Location: [place];  Service: ENT;  Laterality: Left;   INTRAVASCULAR IMAGING/OCT N/A 04/27/2020   Procedure: INTRAVASCULAR IMAGING/OCT;  Surgeon: Sherren Mocha, MD;  Location: [place] [facility];  Service: Cardiovascular;  Laterality: N/A;   INTRAVASCULAR PRESSURE WIRE/FFR STUDY N/A 04/27/2020   Procedure: INTRAVASCULAR PRESSURE WIRE/FFR STUDY;  Surgeon: Sherren Mocha, MD;  Location: [place] [facility];  Service: Cardiovascular;  Laterality: N/A;   IR GASTROSTOMY TUBE MOD SED  01/08/2018   IR THORACENTESIS ASP PLEURAL SPACE W/IMG GUIDE  07/19/2016   LAPAROSCOPIC CHOLECYSTECTOMY  1984   LUMBAR [place] SURGERY  05/1996   L4-5; Dr. Claudean Kinds LAMINECTOMY/DECOMPRESSION MICRODISCECTOMY  10/2002   L3-4. Dr. Sherwood Gambler   MULTIPLE TOOTH EXTRACTIONS  1980s   PARTIAL GLOSSECTOMY  12/02/2017   Dr. Nicolette Bang- [facility]   pharyngoplasty for closure of tingue base defect  12/02/2017   Dr. Nicolette Bang- [place]  07/15/2016   POSTERIOR LUMBAR FUSION  09/1996   Ray cage, L4-5 Dr. Rita Ohara   PROSTATE BIOPSY  ~ 2017   radical tonsillectomy Left 12/02/2017   Dr. Nicolette Bang at [place] N/A 03/31/2020   Procedure: RIGHT HEART CATH AND CORONARY ANGIOGRAPHY;  Surgeon: Larey Dresser, MD;  Location: [place] [facility];  Service: Cardiovascular;  Laterality: N/A;   RIGID BRONCHOSCOPY Left 10/09/2017   Procedure: RIGID BRONCHOSCOPY;  Surgeon: Jodi Marble, MD;  Location: [place];  Service: ENT;  Laterality: Left;   TEE WITHOUT CARDIOVERSION N/A 06/07/2020   Procedure: TRANSESOPHAGEAL ECHOCARDIOGRAM (TEE);  Surgeon: Sherren Mocha, MD;  Location: [place];  Service: Open Heart Surgery;  Laterality: N/A;   TONSILLECTOMY     TOTAL HIP ARTHROPLASTY Left 12/04/2020   Procedure: TOTAL  HIP ARTHROPLASTY ANTERIOR APPROACH;  Surgeon: Rod Can, MD;  Location: [facility];  Service: Orthopedics;  Laterality: Left;   TOTAL HIP ARTHROPLASTY Left 11/2020   TRACHEOSTOMY  12/02/2017   Dr. Nicolette [facility]- [facility]   ULTRASOUND GUIDANCE FOR VASCULAR ACCESS Right 06/07/2020   Procedure: ULTRASOUND GUIDANCE FOR VASCULAR ACCESS;  Surgeon: Sherren Mocha, MD;  Location: [place];  Service: Open Heart Surgery;  Laterality: Right;   ULTRASOUND GUIDANCE FOR VASCULAR ACCESS Bilateral 03/03/2021   Procedure: ULTRASOUND GUIDANCE FOR VASCULAR ACCESS, BILATERAL FEMORAL ARTERIES;  Surgeon: Waynetta Sandy, MD;  Location: [place];  Service: Vascular;  Laterality: Bilateral;   VASCULAR SURGERY      Prior to Admission medications   Medication Sig Start Date  End Date Taking? Authorizing Provider  acetaminophen (TYLENOL) 500 MG tablet Take by mouth.    [provider]  albuterol (VENTOLIN HFA) 108 (90 Base) MCG/ACT inhaler Inhale 2 puffs into the lungs every 6 (six) hours as needed for wheezing or shortness of breath. 01/24/21   Brandon Salter, MD  alendronate (FOSAMAX) 70 MG tablet Take 1 tablet (70 mg total) by mouth once a week. Take with a full glass of water on an empty stomach. Patient taking differently: Take 70 mg by mouth every Tuesday. Take with a full glass of water on an empty stomach. 01/26/21   Bo Merino, MD  allopurinol (ZYLOPRIM) 100 MG tablet TAKE 2 TABLETS(200 MG) BY MOUTH DAILY 12/12/20   Ofilia Neas, PA-C  amLODipine (NORVASC) 10 MG tablet TAKE 1 TABLET(10 MG) BY MOUTH DAILY 06/26/21   Larey Dresser, MD  aspirin EC 81 MG tablet Take 81 mg by mouth daily. Swallow whole.    [provider]  atorvastatin (LIPITOR) 80 MG tablet Take 1 tablet (80 mg total) by mouth daily. 03/21/21   Larey Dresser, MD  carvedilol (COREG) 12.5 MG tablet Take 1 tablet (12.5 mg total) by mouth 2 (two) times daily. 03/24/21 03/24/22  Larey Dresser, MD  Cholecalciferol 25 MCG (1000  UT) tablet Take by mouth.    [provider]  citalopram (CELEXA) 20 MG tablet TAKE 1 TABLET(20 MG) BY MOUTH DAILY 01/20/21   Lovorn, Jinny Blossom, MD  clopidogrel (PLAVIX) 75 MG tablet Take 75 mg by mouth daily.    [provider]  doxycycline (VIBRA-TABS) 100 MG tablet Take 1 tablet (100 mg total) by mouth 2 (two) times daily. 06/06/21   Brandon Salter, MD  ezetimibe (ZETIA) 10 MG tablet Take 1 tablet (10 mg total) by mouth daily. 05/25/21 05/25/22  Larey Dresser, MD  fluticasone (FLONASE) 50 MCG/ACT nasal spray SHAKE LIQUID AND USE 2 SPRAYS IN EACH NOSTRIL DAILY AS NEEDED FOR RHINITIS OR ALLERGIES 11/14/20   Brandon Salter, MD  furosemide (LASIX) 20 MG tablet Take 20 mg by mouth daily.    [provider]  gabapentin (NEURONTIN) 600 MG tablet Take 1 tablet (600 mg total) by mouth 2 (two) times daily. X 1 week, then 600 mg TID- for nerve/back pain 06/02/21   Lovorn, Jinny Blossom, MD  hydrOXYzine (VISTARIL) 25 MG capsule Take 1 capsule (25 mg total) by mouth every 8 (eight) hours as needed. 06/23/21   Brandon Salter, MD  ipratropium (ATROVENT) 0.03 % nasal spray Place 2 sprays into both nostrils every 12 (twelve) hours. Patient taking differently: Place 2 sprays into both nostrils 2 (two) times daily as needed for rhinitis. 01/24/21   Brandon Salter, MD  lisinopril (ZESTRIL) 20 MG tablet Take 20 mg by mouth daily.    [provider]  ondansetron (ZOFRAN-ODT) 4 MG disintegrating tablet Take 1 tablet (4 mg total) by mouth every 8 (eight) hours as needed for nausea or vomiting. 06/20/21   Brandon Salter, MD  Oxycodone HCl 10 MG TABS Take 1 tablet (10 mg total) by mouth every 6 (six) hours as needed. 05/30/21   Lovorn, Jinny Blossom, MD  potassium chloride (KLOR-CON) 10 MEQ tablet Take 2 tablets (20 mEq total) by mouth 2 (two) times daily. 05/01/21   [place]ford, Maricela Bo, FNP  predniSONE (DELTASONE) 1 MG tablet TAKE 3 TABLETS BY MOUTH DAILY ALONG WITH 5 MG FOR TOTAL OF 8 MG DAILY FOR 2 MONTHS  THEN REDUCE TO 7 MG  BY MOUTH DAILY 05/22/21   Ofilia Neas, PA-C  predniSONE (DELTASONE) 20 MG tablet Take 2 tablets (40 mg total) by mouth daily with breakfast. 06/06/21   Brandon Salter, MD  predniSONE (DELTASONE) 5 MG tablet Take 1 tablet (5 mg total) by mouth daily with breakfast. 05/18/21   Bo Merino, MD  pregabalin (LYRICA) 50 MG capsule Take 50 mg by mouth daily.    [provider]  tamsulosin (FLOMAX) 0.4 MG CAPS capsule Take 0.4 mg by mouth in the morning and at bedtime. 12/23/18   [provider]  Tiotropium Bromide Monohydrate (SPIRIVA RESPIMAT) 2.5 MCG/ACT AERS Inhale 2 puffs into the lungs daily. Patient taking differently: Inhale 2 puffs into the lungs daily as needed (shortness of breath). 01/24/21   Brandon Salter, MD  traMADol (ULTRAM) 50 MG tablet Take 2 tablets (100 mg total) by mouth 2 (two) times daily. 06/02/21   Lovorn, Jinny Blossom, MD    Current Facility-Administered Medications  Medication Dose Route Frequency Provider Last Rate Last Admin   acetaminophen (TYLENOL) tablet 650 mg  650 mg Oral Q6H PRN Norval Morton, MD       Or   acetaminophen (TYLENOL) suppository 650 mg  650 mg Rectal Q6H PRN Fuller [name] A, MD       albuterol (PROVENTIL) (2.5 MG/3ML) 0.083% nebulizer solution 2.5 mg  2.5 mg Nebulization Q6H PRN Fuller [name] A, MD       lactated ringers infusion   Intravenous Continuous Fuller [name] A, MD 100 mL/hr at 06/27/21 0453 New Bag at 06/27/21 0453   ondansetron (ZOFRAN) tablet 4 mg  4 mg Oral Q6H PRN Norval Morton, MD       Or   ondansetron (ZOFRAN) injection 4 mg  4 mg Intravenous Q6H PRN Fuller [name] A, MD       oxyCODONE (Oxy IR/ROXICODONE) immediate release tablet 10 mg  10 mg Oral Q6H PRN Fuller [name] A, MD       pantoprazole (PROTONIX) injection 40 mg  40 mg Intravenous Q12H Smith, Rondell A, MD   40 mg at 06/27/21 0138   phenol (CHLORASEPTIC) mouth spray 1 spray  1 spray Mouth/Throat PRN Fuller [name] A, MD        sodium chloride flush (NS) 0.9 % injection 3 mL  3 mL Intravenous Q12H Smith, Rondell A, MD   3 mL at 06/26/21 2108    Allergies as of 06/26/2021 - Review Complete 06/26/2021  Allergen Reaction Noted   Celebrex [celecoxib] Hives and Itching    Tape  11/25/2017    Family History  Problem Relation Age of Onset   Heart disease Father 97       Living   Coronary artery disease Father        CABG   Alzheimer's disease Mother 54       Deceased   Arthritis Mother    Aneurysm Brother    Stomach cancer Maternal Uncle    Brain cancer Maternal Aunt        x2   Obesity Daughter        Had Bypass Sx    Social History   Socioeconomic History   Marital status: Married    Spouse name: etta   Number of children: 2   Years of education: Not on file   Highest education level: Not on file  Occupational History   Occupation: retired    Fish farm manager: RETIRED    Comment: disabled due to back problems  Tobacco  Use   Smoking status: Former    Packs/day: 1.50    Years: 50.00    Pack years: 75.00    Types: Cigarettes    Quit date: 09/05/2017    Years since quitting: 3.8   Smokeless tobacco: Never   Tobacco comments:    smoked less than 1 ppd for 40+ years;   Vaping Use   Vaping Use: Never used  Substance and Sexual Activity   Alcohol use: Not Currently   Drug use: Yes    Types: Oxycodone    Comment: has prescription   Sexual activity: Not Currently  Other Topics Concern   Not on file  Social History Narrative   ** Merged History Encounter **       Married (3rd), [place]. 2 children from 1st marriage, 4 step children.    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       Cell # [phone]   Social Determinants of Health   Financial Resource Strain: Low Risk    Difficulty of Paying Living Expenses: Not hard at all  Food Insecurity: No Food Insecurity   Worried About [place] in the Last Year:  Never true   [place] in the Last Year: Never true  Transportation Needs: No Transportation Needs   Lack of Transportation (Medical): No   Lack of Transportation (Non-Medical): No  Physical Activity: Inactive   Days of Exercise per Week: 0 days   Minutes of Exercise per Session: 0 min  Stress: No Stress Concern Present   Feeling of Stress : Not at all  Social Connections: Moderately Integrated   Frequency of Communication with Friends and Family: Twice a week   Frequency of Social Gatherings with Friends and Family: Twice a week   Attends Religious Services: More than 4 times per year   Active Member of [employer] or Organizations: No   Attends Archivist Meetings: Never   Marital Status: Married  Human resources officer Violence: Not At Risk   Fear of Current or Ex-Partner: No   Emotionally Abused: No   Physically Abused: No   Sexually Abused: No    Review of Systems: Gen: Denies fever, sweats or chills. No weight loss.  CV: Denies chest pain, palpitations or edema. Resp: + Cough. GI: See HPI.   GU : Denies urinary burning, blood in urine, increased urinary frequency or incontinence. MS: Denies joint pain, muscles aches or weakness. Derm: Denies rash, itchiness, skin lesions or unhealing ulcers. Psych: + Confusion.  Heme: Denies easy bruising, bleeding. Neuro:  + Headaches. UE jerking movements.  Endo: + DM II.   Physical Exam: Vital signs in last 24 hours: Temp:  [98 F (36.7 C)-99.5 F (37.5 C)] 98.5 F (36.9 C) (05/23 0724) Pulse Rate:  [60-94] 77 (05/23 0724) Resp:  [14-21] 17 (05/23 0724) BP: (81-135)/(45-113) 118/55 (05/23 0724) SpO2:  [89 %-100 %] 97 % (05/23 0724) Weight:  [74.8 kg] 74.8 kg (05/22 0844)   General:  Alert fatigued appearing 81 year old male in no acute distress. Head:  Normocephalic and atraumatic. Eyes:  No scleral icterus. Conjunctiva pink. Ears:  Normal auditory acuity. Nose:  No deformity, discharge or lesions. Mouth: Absent  dentition.  No ulcers or lesions.  Neck:  Supple. No lymphadenopathy or thyromegaly.  Lungs: Coarse breath sounds with expiration. Heart: Regular rate and rhythm, no murmurs. Abdomen: Soft, nondistended.  No palpable mass.  Positive bowel sounds to all 4 quadrants.  No HSM Rectal: No external hemorrhoids.  No significant internal hemorrhoids.  A small amount of golden loose stool in the rectal vault, guaiac negative.  RN at the bedside time of exam. Musculoskeletal:  Symmetrical without gross deformities.  Pulses:  Normal pulses noted. Extremities:  Without clubbing or edema. Neurologic:  Alert and  oriented x 4.  Intermittent myoclonic jerking events to his upper extremities. Skin:  Intact without significant lesions or rashes. Psych:  Alert and cooperative. Normal mood and affect.  Intake/Output from previous day: 05/22 0701 - 05/23 0700 In: 1635.2 [P.O.:220; I.V.:50; Blood:365.2; IV Piggyback:1000] Out: 9450 [Urine:1375] Intake/Output this shift: No intake/output data recorded.  Lab Results: Recent Labs    06/26/21 0904 06/26/21 1922 06/27/21 0335  WBC 6.5  --  5.2  HGB 9.0* 7.8* 9.5*  HCT 29.5* 25.9* 30.5*  PLT 125*  --  101*   BMET Recent Labs    06/26/21 0904 06/26/21 1922 06/27/21 0335  NA 129* 129* 130*  K 6.0* 5.5* 5.7*  CL 98 100 101  CO2 20* 19* 21*  GLUCOSE 94 97 87  BUN 86* 81* 73*  CREATININE 7.43* 5.62* 3.92*  CALCIUM 8.8* 8.3* 8.8*   LFT Recent Labs    06/26/21 0904  PROT 6.0*  ALBUMIN 3.2*  AST 13*  ALT 13  ALKPHOS 103  BILITOT 0.6   PT/INR No results for input(s): LABPROT, INR in the last 72 hours. Hepatitis Panel No results for input(s): HEPBSAG, HCVAB, HEPAIGM, HEPBIGM in the last 72 hours.    Studies/Results: CT Head Wo Contrast  Result Date: 06/26/2021 CLINICAL DATA:  Mental status change, unknown cause is EXAM: CT HEAD WITHOUT CONTRAST TECHNIQUE: Contiguous axial images were obtained from the base of the skull through the  vertex without intravenous contrast. RADIATION DOSE REDUCTION: This exam was performed according to the departmental dose-optimization program which includes automated exposure control, adjustment of the mA and/or kV according to patient size and/or use of iterative reconstruction technique. COMPARISON:  None Available. FINDINGS: Brain: No acute intracranial hemorrhage. No focal mass lesion. No CT evidence of acute infarction. No midline shift or mass effect. No hydrocephalus. Basilar cisterns are patent. There are periventricular and subcortical white matter hypodensities. Generalized cortical atrophy. Vascular: No hyperdense vessel or unexpected calcification. Skull: Normal. Negative for fracture or focal lesion. Sinuses/Orbits: Paranasal sinuses and mastoid air cells are clear. Orbits are clear. Other: None. IMPRESSION: No acute intracranial findings. Electronically Signed   By: Suzy Bouchard M.D.   On: 06/26/2021 09:33   US RENAL  Result Date: 06/26/2021 CLINICAL DATA:  Acute renal dysfunction EXAM: RENAL / URINARY TRACT ULTRASOUND COMPLETE COMPARISON:  CT done on 03/24/2021 and abdominal sonogram done on 03/17/2020 FINDINGS: Right Kidney: Renal measurements: 12.7 x 6.5 x 5.2 cm = volume: 226.1 mL. Echogenicity within normal limits. No mass or hydronephrosis visualized. Left Kidney: Renal measurements: 12.9 x 6.4 x 5.2 cm = volume: 224.8 mL. Echogenicity within normal limits. No mass or hydronephrosis visualized. Bladder: Appears normal for degree of bladder distention. Other: None. IMPRESSION: No sonographic abnormality is seen in the kidneys. There is no hydronephrosis. Electronically Signed   By: Elmer Picker M.D.   On: 06/26/2021 13:27   DG Chest Port 1 View  Result Date: 06/26/2021 CLINICAL DATA:  Weakness. EXAM: PORTABLE CHEST 1 VIEW COMPARISON:  03/03/2021 FINDINGS: 0933 hours. Low volume film. Streaky opacity in the lung bases suggest atelectasis. Cardiopericardial silhouette is at  upper limits  of normal for size. No pulmonary edema or focal airspace consolidation. No substantial pleural effusion. Telemetry leads overlie the chest. IMPRESSION: Low volume film with basilar atelectasis. Electronically Signed   By: Misty Stanley M.D.   On: 06/26/2021 09:46    IMPRESSION/[name]:  62) 81 year old male admitted to the hospital with acute kidney failure and acute on chronic anemia. Admission Hg 9.0 (baseline Hg 11.8) -> Hg 7.8 -> transfused 1 unit of PRBCs -> Hg 9.5. No overt GI bleeding. Rectal exam showed loose gold colored stool heme negative. Low B12 level and iron levels 11/2020.  -No plans for endoscopic evaluation at this time as he is not active GI bleeding. Await further recommendations per Dr. Silverio Decamp -Monitor H/H closely -Transfuse for Hg < 8 -Iron, TIBC, ferritin level in am -B12 1,000 mcg injection SQ once weekly x 4 weeks then once monthly  -Zofran 4 mg p.o. or IV every 6 hours as needed -Clear liquid diet, advance as tolerated  2) Acute renal failure, likely due to anemia and dehydration in setting of recent N/V/D illness. CR 7.43 -> 5.62 -> 3.92.  3) Acute metabolic encephalopathy/myoclonic jerking.  Head CT doubt acute intracranial abnormalities.  4) History of GERD/esophageal stricture and hiatal hernia -Pantoprazole 40 mg p.o. daily  5) History of tubular adenomatous colon polyp -No further colon polyp surveillance colonoscopies recommended due to age  74) History of hepatic steatosis weakly positive ASAM and positive AMA levels  Continue follow-up with Brandon Locks NP at [facility]  7) CAD. S/P TAVR 06/2020. Last dose of Plavix was on 06/25/2021  8) COPD. Persistent cough/congestion.   9) Hyponatremia.  Hyperkalemia. -Management per the medical service    Noralyn Pick  06/27/2021, 09:41AM    Attending physician's note  I have taken a history, reviewed the chart and examined the patient. I performed a substantive portion of  this encounter, including complete performance of at least one of the key components, in conjunction with the APP. I agree with the APP's note, impression and recommendations.    81 year old very pleasant gentleman with history of aortic stenosis s/p TAVR on Plavix, status post abdominal aortic aneurysm endovascular repair with subsequent relining of iliac limbs for stenosis on the left side admitted with worsening renal function AKI, severe hyperkalemia in the setting of poor oral intake with nausea, vomiting and diarrhea  Nausea vomiting and diarrhea possibly secondary to acute viral gastroenteritis, has since resolved No history of melena or rectal bleeding.  Fecal Hemoccult negative  Do not recommend endoscopic evaluation at this point given lack of evidence of active GI hemorrhage He has chronic anemia, has had work-up in the past.  Noted severe B12 deficiency: Replete B12 1000 mcg weekly injections X4 Can also benefit from IV Feraheme infusion, has mild iron deficiency  Continue supportive care GI will continue to follow along  The patient was provided an opportunity to ask questions and all were answered. The patient agreed with the [name] and demonstrated an understanding of the instructions.  Damaris Hippo , MD [phone]

## 2021-06-27 NOTE — Consult Note (Cosign Needed)
Neurology Consultation  Reason for Consult: myoclonic jerking  Referring Physician: Dr. Karleen Hampshire  CC: ARF  History is obtained from:wife at bedside and medical record   HPI: Brandon [name] is a 81 y.o. male with past medical history of HTN, HLD, CHF, BPH, anxiety/depression, TIA, CAD who presents to the [facility] Ed for evaluation of jerking all over his body and confusion. Per wife he had nausea and vomiting and diarrhea last week and was unable to keep anything down last week for a few days. He also had been having multiple falls over the last few weeks. She states 2 days ago she noticed his bilateral arms and legs to have jerking movements. She states today the jerking movements are much improved but still is having them sometimes. She also states the jerking movements will get worse when he gets upset. Neurology consulted for further assistance.    ROS: Full ROS was performed and is negative except as noted in the HPI  Past Medical History:  Diagnosis Date   Abdominal aneurysm ([place])    Arthritis    "all over" (07/19/2016)   BPH (benign prostatic hypertrophy)    CAD (coronary artery disease)    Chicken pox    Chronic lower back pain    s/p surgical fusion   Depression    Diverticulosis    Esophageal stricture    GERD (gastroesophageal reflux disease)    Hepatitis B 1984   Hiatal hernia    History of radiation therapy 01/16/18- 03/05/18   Left Tonsil, 66 Gy in 33 fractions to high risk nodal echelons.    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 Burt Knack and Dr Cyndia Bent    Severe aortic stenosis    TIA (transient ischemic attack) 1990s   hx   tonsillar ca dx'd 11/2017   Tubular adenoma of colon 2009     Family History  Problem Relation Age of Onset   Heart disease Father 55       Living   Coronary  artery disease Father        CABG   Alzheimer's disease Mother 4       Deceased   Arthritis Mother    Aneurysm Brother    Stomach cancer Maternal Uncle    Brain cancer Maternal Aunt        x2   Obesity Daughter        Had Bypass Sx     Social History:   reports that he quit smoking about 3 years ago. His smoking use included cigarettes. He has a 75.00 pack-year smoking history. He has never used smokeless tobacco. He reports that he does not currently use alcohol. He reports current drug use. Drug: Oxycodone.  Medications  Current Facility-Administered Medications:    acetaminophen (TYLENOL) tablet 650 mg, 650 mg, Oral, Q6H PRN **OR** acetaminophen (TYLENOL) suppository 650 mg, 650 mg, Rectal, Q6H PRN, Smith, Rondell A, MD   albuterol (PROVENTIL) (2.5 MG/3ML) 0.083% nebulizer solution 2.5 mg, 2.5 mg, Nebulization, Q6H PRN, Hosie Poisson, MD   atorvastatin (LIPITOR) tablet 80 mg, 80 mg, Oral, QHS, Akula, Vijaya, MD   Chlorhexidine Gluconate Cloth 2 % PADS 6 each, 6 each, Topical, Q0600, Hosie Poisson, MD, 6 each at 06/27/21 0941   cyanocobalamin ((VITAMIN B-12)) injection 1,000 mcg, 1,000 mcg,  Subcutaneous, Weekly, Carl Best M, NP, 1,000 mcg at 06/27/21 1144   [START ON 06/28/2021] ezetimibe (ZETIA) tablet 10 mg, 10 mg, Oral, Daily, Hosie Poisson, MD   fluticasone (FLONASE) 50 MCG/ACT nasal spray 2 spray, 2 spray, Each Nare, Daily PRN, Hosie Poisson, MD   ipratropium (ATROVENT) 0.06 % nasal spray 2 spray, 2 spray, Each Nare, BID PRN, Hosie Poisson, MD   lactated ringers infusion, , Intravenous, Continuous, Fuller Plan A, MD, Last Rate: 100 mL/hr at 06/27/21 0453, New Bag at 06/27/21 0453   ondansetron (ZOFRAN) tablet 4 mg, 4 mg, Oral, Q6H PRN **OR** ondansetron (ZOFRAN) injection 4 mg, 4 mg, Intravenous, Q6H PRN, Tamala Julian, Rondell A, MD   oxyCODONE (Oxy IR/ROXICODONE) immediate release tablet 10 mg, 10 mg, Oral, Q6H PRN, Norval Morton, MD   [START ON 06/28/2021]  pantoprazole (PROTONIX) EC tablet 40 mg, 40 mg, Oral, Daily, Kennedy-Smith, Colleen M, NP   phenol (CHLORASEPTIC) mouth spray 1 spray, 1 spray, Mouth/Throat, PRN, Tamala Julian, Rondell A, MD   sodium chloride flush (NS) 0.9 % injection 3 mL, 3 mL, Intravenous, Q12H, Smith, Rondell A, MD, 3 mL at 06/27/21 0941   sodium zirconium cyclosilicate (LOKELMA) packet 10 g, 10 g, Oral, BID, Hosie Poisson, MD, 10 g at 06/27/21 0940   tamsulosin (FLOMAX) capsule 0.4 mg, 0.4 mg, Oral, QPC supper, Hosie Poisson, MD, 0.4 mg at 06/27/21 1832   Exam: Current vital signs: BP (!) 145/64 (BP Location: Left Arm)   Pulse 78   Temp 97.9 F (36.6 C) (Oral)   Resp 17   Ht 5' 6.5" (1.689 m)   Wt 74.8 kg   SpO2 100%   BMI 26.23 kg/m  Vital signs in last 24 hours: Temp:  [97.9 F (36.6 C)-99.5 F (37.5 C)] 97.9 F (36.6 C) (05/23 1457) Pulse Rate:  [71-78] 78 (05/23 1457) Resp:  [17-19] 17 (05/23 1457) BP: (102-145)/(45-74) 145/64 (05/23 1457) SpO2:  [91 %-100 %] 100 % (05/23 1457)  GENERAL: confused, Awake, alert in NAD HEENT: - Normocephalic and atraumatic, dry mm LUNGS - Clear to auscultation bilaterally with no wheezes CV - S1S2 RRR, no m/r/g, equal pulses bilaterally. ABDOMEN - Soft, nontender, nondistended with normoactive BS Ext: warm, well perfused, intact peripheral pulses, no edema  NEURO:  Mental Status: AA&Ox2-3. He is able to state his name, at first states age incorrectly as "43" and then corrects himself to 34, able to name city we are in and hospital as place, but unable to name the hospital.  He is distressed over the fact he can't remember things or what is going on.  Language: speech is clear Can name objects, follow commands, cannot give a full history  Cranial Nerves: PERRL 2 mm/brisk. EOMI, visual fields full, no facial asymmetry, facial sensation intact, hearing intact, tongue/uvula/soft palate midline, normal sternocleidomastoid and trapezius muscle strength. No evidence of tongue atrophy  or fibrillations Motor: 4/5 in upper extremities and lower extremities 4/5  Tone: is normal and bulk is normal Sensation- Intact to light touch bilaterally Coordination: FTN intact bilaterally, no ataxia in BLE. Gait- deferred  LABS: NA 129 K 6.0 BUN 86 Cr 7.43 Urin NA 26 Urine Cr 194.94 Urine Osmo 302 LDH 169  Imaging I have reviewed the images obtained:  CT-head No acute process   Assessment:  Brandon [name] is a 81 y.o. male with past medical history of HTN, HLD, CHF, BPH, anxiety/depression, TIA, CAD who presents to the [facility] Ed for evaluation of jerking all over his body and  confusion. Per wife he had nausea and vomiting and diarrhea last week and was unable to keep anything down last week for a few days. He also had been having multiple falls over the last few weeks. She states 2 days ago she noticed his bilateral arms and legs to have jerking movements.  Myoclonic jerking most likely related to ARF and less likely gabapentin toxicity   Recommendations: - Delirium precautions - Check thiamine, Vit E and TSH and ( Vitamin B12, folate already ordered) -rEEG to r/o seizures  - MRI Brain (already ordered)  - Continue to hold home gabapentin - Continue to correct metabolic abnormalities per primary  - Hydrate patient per primary team  - Neurology will continue to follow   Beulah Gandy DNP, [place]

## 2021-06-27 NOTE — Procedures (Signed)
Patient Name: Brandon [name]  MRN: [id number]  Epilepsy Attending: Lora Havens  Referring Physician/Provider: August Albino, NP Date: 06/27/2021 Duration: 24.33 mins  Patient history: 81 y.o. male with past medical history of HTN, HLD, CHF, BPH, anxiety/depression, TIA, CAD who presents to the [facility] Ed for evaluation of jerking all over his body and confusion. EEG to evaluate for seizure  Level of alertness: Awake, asleep  AEDs during EEG study: None  Technical aspects: This EEG study was done with scalp electrodes positioned according to the 10-20 International system of electrode placement. Electrical activity was acquired at a sampling rate of '500Hz'$  and reviewed with a high frequency filter of '70Hz'$  and a low frequency filter of '1Hz'$ . EEG data were recorded continuously and digitally stored.   Description: The posterior dominant rhythm consists of '8Hz'$  activity of moderate voltage (25-35 uV) seen predominantly in posterior head regions, symmetric and reactive to eye opening and eye closing. Sleep was characterized by vertex waves, sleep spindles (12 to 14 Hz), maximal frontocentral region. EEG showed intermittent generalized  3 to 6 Hz theta-delta slowing.  Patient was noted to have episodes of bilateral lower extremity and upper extremity asynchronous twitching.  Concomitant EEG before, during and after the event did not show any EEG changes suggest seizure.  Hyperventilation and photic stimulation were not performed.     ABNORMALITY - Intermittent slow, generalized  IMPRESSION: This study is suggestive of mild diffuse encephalopathy, nonspecific etiology. No seizures or epileptiform discharges were seen throughout the recording.  Patient was noted to have episodes of bilateral lower extremity and upper extremity asynchronous twitching without concomitant EEG change. These episodes were most likely NOT epileptic.   Markus Casten Barbra Sarks

## 2021-06-27 NOTE — Consult Note (Signed)
Consultation Note Date: 06/27/2021   Patient Name: Brandon [name]  DOB: 1940-05-14  MRN: [id number]  Age / Sex: 81 y.o., male  PCP: Haydee Salter, MD Referring Physician: Hosie Poisson, MD  Reason for Consultation: Establishing goals of care  HPI/Patient Profile: 81 y.o. male  with past medical history of hypertension, hyperlipidemia, chronic diastolic CHF, CAD, aortic stenosis s/p TAVR on Plavix, TIA, COPD, and chronic back pain admitted on 06/26/2021 with jerking extremities and altered mental status over the past couple of days.  He has also had several falls at home over the last 2 weeks.  Patient admitted with acute renal failure and acute symptomatic anemia.  PMT has been consulted to assist with goals of care conversation.  Clinical Assessment and Goals of Care:  I have reviewed medical records including EPIC notes, labs and imaging, assessed the patient and then met at the bedside with patient's wife Brandon [name] to discuss diagnosis prognosis, GOC, EOL wishes, disposition and options.  I introduced Palliative Medicine as specialized medical care for people living with serious illness. It focuses on providing relief from the symptoms and stress of a serious illness. The goal is to improve quality of life for both the patient and the family.  We discussed a brief life review of the patient and then focused on their current illness.   I attempted to elicit values and goals of care important to the patient.    Medical History Review and Understanding:  Patient shares that many of his health issues began around 7 to 8 years ago and have been gradually worsening.  He feels like he is constantly dealing with setback after another.  Social History: Patient and his wife have been married for 27 years. He has 1 daughter and 1 son from previous marriage and both patient and his wife were widowed when they met.  He is a  retired Chemical engineer.  Their question.  Functional and Nutritional State: Patient endorses ongoing decline.  Mental status is slowly returning to baseline.  He reports usually having good appetite.   Advance Directives: A detailed discussion regarding advanced directives was had. HCPOA and Living Will are on file in [place].  Code Status: Concepts specific to code status, artifical feeding and hydration, and rehospitalization were considered and discussed.   Discussion: Reviewed patient's current illness in detail including ARF, possible gabapentin toxicity with plan for further neurology input, acute encephalopathy, symptomatic anemia, as he is still somewhat confused about the course of events.  He is frustrated that has been unable to do housework and yard work, which brings him joy, because of these recurrent illnesses and overall worsening health for the past several years.  They previously enjoyed camping but have not done that in some time.  Still enjoyed going to the beach once a year.  Patient states that he pulls through fast when these instances occur but he would like some time with improved quality of life between illnesses.  We discussed potential for further decline given his chronic comorbidities and advanced age.  Discussed the importance of planning for the worst while hoping for the best.  Counseled patient to share his care preferences with family and wife shares they have already talked about DNR.  She is hesitant about ongoing palliative support, as their children are already very supportive.  We discussed potential for assistance with symptom management if adjustments will need to be made for chronic back pain and/or neuropathy.  For the time being they  are open to follow-up when I am back on service on Thursday.   The difference between aggressive medical intervention and comfort care was considered in light of the patient's goals of care. Hospice and  Palliative Care services outpatient were explained and offered.   Discussed the importance of continued conversation with family and the medical providers regarding overall plan of care and treatment options, ensuring decisions are within the context of the patient's values and GOCs.   Questions and concerns were addressed.  Hard Choices booklet left for review. The family was encouraged to call with questions or concerns.  PMT will continue to support holistically.   SUMMARY OF RECOMMENDATIONS   -Change code status to DNR, gold form signed and placed on patient's hard chart for discharge. Will scan copy into [facility] -Full scope treatment -Goal is for recovery to previous baseline and continue living at home independently as long as possible -Patient endorses ongoing decline, however they are not interested in outpatient palliative care follow-up at this time -PMT will continue to follow  Prognosis:  Unable to determine  Discharge Planning:  Home       Primary Diagnoses: Present on Admission:  ARF (acute renal failure) ([place])  Symptomatic anemia  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HCC)  Hyponatremia  Hyperkalemia  Acute metabolic encephalopathy  Acute respiratory failure with hypoxia (HCC)  Myoclonic jerking  Chronic pain syndrome  History of benign prostatic hyperplasia  Essential hypertension  Gastroesophageal reflux disease  Physical Exam Vitals and nursing note reviewed.  Constitutional:      General: He is awake. He is not in acute distress.    Appearance: He is overweight.  Cardiovascular:     Rate and Rhythm: Normal rate.  Pulmonary:     Effort: Pulmonary effort is normal. No respiratory distress.  Skin:    General: Skin is warm and dry.  Neurological:     Mental Status: He is alert. He is confused.     Comments: Confusion improving since this AM  Psychiatric:        Behavior: Behavior is cooperative.    Vital Signs: BP (!) 118/55 (BP Location: Left Arm)   Pulse  77   Temp 98.5 F (36.9 C) (Oral)   Resp 17   Ht 5' 6.5" (1.689 m)   Wt 74.8 kg   SpO2 97%   BMI 26.23 kg/m  Pain Scale: 0-10   Pain Score: 0-No pain   SpO2: SpO2: 97 % O2 Device:SpO2: 97 % O2 Flow Rate: .O2 Flow Rate (L/min): 3 L/min   Palliative Assessment/Data:     MDM: High   Cathan Gearin Johnnette Litter, PA-C  Palliative Medicine Team Team phone # [phone]  Thank you for allowing the Palliative Medicine Team to assist in the care of this patient. Please utilize secure chat with additional questions, if there is no response within 30 minutes please call the above phone number.  Palliative Medicine Team providers are available by phone from 7am to 7pm daily and can be reached through the team cell phone.  Should this patient require assistance outside of these hours, please call the patient's attending physician.

## 2021-06-28 ENCOUNTER — Inpatient Hospital Stay (HOSPITAL_COMMUNITY): Payer: Medicare Other

## 2021-06-28 DIAGNOSIS — D649 Anemia, unspecified: Secondary | ICD-10-CM | POA: Diagnosis not present

## 2021-06-28 DIAGNOSIS — G894 Chronic pain syndrome: Secondary | ICD-10-CM | POA: Diagnosis not present

## 2021-06-28 DIAGNOSIS — E538 Deficiency of other specified B group vitamins: Secondary | ICD-10-CM

## 2021-06-28 DIAGNOSIS — N179 Acute kidney failure, unspecified: Secondary | ICD-10-CM | POA: Diagnosis not present

## 2021-06-28 DIAGNOSIS — G253 Myoclonus: Secondary | ICD-10-CM | POA: Diagnosis not present

## 2021-06-28 DIAGNOSIS — J9601 Acute respiratory failure with hypoxia: Secondary | ICD-10-CM | POA: Diagnosis not present

## 2021-06-28 DIAGNOSIS — G9341 Metabolic encephalopathy: Secondary | ICD-10-CM | POA: Diagnosis not present

## 2021-06-28 LAB — BASIC METABOLIC PANEL
Anion gap: 9 (ref 5–15)
BUN: 50 mg/dL — ABNORMAL HIGH (ref 8–23)
CO2: 20 mmol/L — ABNORMAL LOW (ref 22–32)
Calcium: 9 mg/dL (ref 8.9–10.3)
Chloride: 109 mmol/L (ref 98–111)
Creatinine, Ser: 1.55 mg/dL — ABNORMAL HIGH (ref 0.61–1.24)
GFR, Estimated: 46 mL/min — ABNORMAL LOW (ref >60)
Glucose, Bld: 89 mg/dL (ref 70–99)
Potassium: 4.8 mmol/L (ref 3.5–5.1)
Sodium: 138 mmol/L (ref 135–145)

## 2021-06-28 LAB — CBC WITH DIFFERENTIAL/PLATELET
Abs Immature Granulocytes: 0.02 10*3/uL (ref 0.00–0.07)
Basophils Absolute: 0 10*3/uL (ref 0.0–0.1)
Basophils Relative: 0 %
Eosinophils Absolute: 0.2 10*3/uL (ref 0.0–0.5)
Eosinophils Relative: 4 %
HCT: 32.4 % — ABNORMAL LOW (ref 39.0–52.0)
Hemoglobin: 10.1 g/dL — ABNORMAL LOW (ref 13.0–17.0)
Immature Granulocytes: 1 %
Lymphocytes Relative: 13 %
Lymphs Abs: 0.5 10*3/uL — ABNORMAL LOW (ref 0.7–4.0)
MCH: 25.1 pg — ABNORMAL LOW (ref 26.0–34.0)
MCHC: 31.2 g/dL (ref 30.0–36.0)
MCV: 80.4 fL (ref 80.0–100.0)
Monocytes Absolute: 0.3 10*3/uL (ref 0.1–1.0)
Monocytes Relative: 8 %
Neutro Abs: 2.9 10*3/uL (ref 1.7–7.7)
Neutrophils Relative %: 74 %
Platelets: 104 10*3/uL — ABNORMAL LOW (ref 150–400)
RBC: 4.03 MIL/uL — ABNORMAL LOW (ref 4.22–5.81)
RDW: 16.8 % — ABNORMAL HIGH (ref 11.5–15.5)
WBC: 3.9 10*3/uL — ABNORMAL LOW (ref 4.0–10.5)
nRBC: 0 % (ref 0.0–0.2)

## 2021-06-28 LAB — FOLATE: Folate: 15.6 ng/mL (ref >5.9)

## 2021-06-28 LAB — HAPTOGLOBIN: Haptoglobin: 230 mg/dL (ref 34–355)

## 2021-06-28 LAB — RETICULOCYTES
Immature Retic Fract: 17.6 % — ABNORMAL HIGH (ref 2.3–15.9)
RBC.: 4.04 MIL/uL — ABNORMAL LOW (ref 4.22–5.81)
Retic Count, Absolute: 46.9 10*3/uL (ref 19.0–186.0)
Retic Ct Pct: 1.2 % (ref 0.4–3.1)

## 2021-06-28 LAB — IRON AND TIBC
Iron: 13 ug/dL — ABNORMAL LOW (ref 45–182)
Saturation Ratios: 5 % — ABNORMAL LOW (ref 17.9–39.5)
TIBC: 260 ug/dL (ref 250–450)
UIBC: 247 ug/dL

## 2021-06-28 LAB — VITAMIN B12: Vitamin B-12: >7500 pg/mL — ABNORMAL HIGH (ref 180–914)

## 2021-06-28 LAB — FERRITIN: Ferritin: 174 ng/mL (ref 24–336)

## 2021-06-28 LAB — CALCIUM, IONIZED: Calcium, Ionized, Serum: 4.9 mg/dL (ref 4.5–5.6)

## 2021-06-28 LAB — TSH: TSH: 0.73 u[IU]/mL (ref 0.350–4.500)

## 2021-06-28 LAB — UREA NITROGEN, URINE: Urea Nitrogen, Ur: 415 mg/dL

## 2021-06-28 MED ORDER — THIAMINE HCL 100 MG/ML IJ SOLN
100.0000 mg | Freq: Every day | INTRAMUSCULAR | Status: DC
Start: 2021-06-28 — End: 2021-06-28
  Administered 2021-06-28: 100 mg via INTRAVENOUS
  Filled 2021-06-28: qty 2

## 2021-06-28 MED ORDER — DIPHENHYDRAMINE HCL 25 MG PO CAPS
25.0000 mg | ORAL_CAPSULE | Freq: Four times a day (QID) | ORAL | Status: DC | PRN
  Administered 2021-06-28 – 2021-06-29 (×3): 25 mg via ORAL
  Filled 2021-06-28 (×3): qty 1

## 2021-06-28 MED ORDER — UMECLIDINIUM BROMIDE 62.5 MCG/ACT IN AEPB
1.0000 | INHALATION_SPRAY | Freq: Every day | RESPIRATORY_TRACT | Status: DC
  Filled 2021-06-28: qty 7

## 2021-06-28 MED ORDER — THIAMINE HCL 100 MG PO TABS
100.0000 mg | ORAL_TABLET | Freq: Every day | ORAL | Status: DC
  Administered 2021-06-29: 100 mg via ORAL
  Filled 2021-06-28: qty 1

## 2021-06-28 MED ORDER — PREDNISONE 5 MG PO TABS
8.0000 mg | ORAL_TABLET | Freq: Every day | ORAL | Status: DC
  Administered 2021-06-28 – 2021-06-29 (×2): 8 mg via ORAL
  Filled 2021-06-28 (×2): qty 3

## 2021-06-28 MED ORDER — ALLOPURINOL 100 MG PO TABS
100.0000 mg | ORAL_TABLET | Freq: Every day | ORAL | Status: DC
  Administered 2021-06-28 – 2021-06-29 (×2): 100 mg via ORAL
  Filled 2021-06-28 (×2): qty 1

## 2021-06-28 MED ORDER — UMECLIDINIUM BROMIDE 62.5 MCG/ACT IN AEPB
1.0000 | INHALATION_SPRAY | Freq: Every day | RESPIRATORY_TRACT | Status: DC
  Administered 2021-06-29: 1 via RESPIRATORY_TRACT
  Filled 2021-06-28: qty 7

## 2021-06-28 NOTE — Care Management Important Message (Signed)
Important Message  Patient Details  Name: Brandon [name] MRN: [id number] Date of Birth: 1940-04-21   Medicare Important Message Given:  Yes     Orbie Pyo 06/28/2021, 2:28 PM

## 2021-06-28 NOTE — Evaluation (Signed)
Occupational Therapy Evaluation Patient Details Name: Brandon [name] MRN: [id number] DOB: 1940-10-01 Today's Date: 06/28/2021   History of Present Illness Pt is a 81 y.o. male who presented 06/26/21 with myoclonic jerking. Pt with acute renal failure, symptomatic anemia, acute repiratory failure with hypoxia, hyperkalemia, and acte metabolic encephalopathy, and hyponatremia. CT of head was negative for acute intracranial abnormalities. 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 During eval with PT (step-son present) family endorsing decline in cognition of late, and multiple falls. Wife present in OT session, however cognitive decline and multiple falls not mentioned. Currently, patient presenting decreased activity tolerance, impulsivity, and decreased insights into deficits. Patient unable to recite months backwards without maximal extra time, cues to initiate (start with December) and requiring assist when he got to March and could not complete the final 2 months without step by step assistance. Patient requiring mod A for ADLs and min guard to min A for transfers and ambulation due to impulsivity. OT providing education to wife with regard to IADLS and higher level cognitive tasks, stating patient should not drive unless cleared by an MD. Patient and wife in agreement. OT not recommending [place] services at this time, OT will continue to follow acutely.      Recommendations for follow up therapy are one component of a multi-disciplinary discharge planning process, led by the attending physician.  Recommendations may be updated based on patient status, additional functional criteria and insurance authorization.   Follow Up Recommendations  No OT follow up     Assistance Recommended at Discharge Frequent or constant Supervision/Assistance  Patient can return home with the following A little help with walking and/or transfers;A lot of help with bathing/dressing/bathroom;Assistance with cooking/housework;Direct supervision/assist for medications management;Direct supervision/assist for financial management;Assist for transportation;Help with stairs or ramp for entrance    Functional Status Assessment  Patient has had a recent decline in their functional status and demonstrates the ability to make significant improvements in function in a reasonable and predictable amount of time.  Equipment Recommendations  BSC/3in1    Recommendations for Other Services       Precautions / Restrictions Precautions Precautions: Fall Precaution Comments: several recent falls PTA Restrictions Weight Bearing Restrictions: No      Mobility Bed Mobility Overal bed mobility: Needs Assistance Bed Mobility: Supine to Sit, Sit to Supine     Supine to sit: Min guard, HOB elevated Sit to supine: Min guard, HOB elevated   General bed mobility comments: Min guard for safety, using bed rails, HOB elevated    Transfers Overall transfer level: Needs assistance Equipment used: Rolling walker (2 wheels), 1 person hand held assist Transfers: Sit to/from Stand Sit to Stand: Min guard           General transfer comment: Impulsively standing due to discomfort from foley, difficulty transitioning to other subjects once focused foley pain      Balance Overall balance assessment: Needs assistance Sitting-balance support: No upper extremity supported, Feet supported Sitting balance-Leahy Scale: Fair     Standing balance support: Bilateral upper extremity supported, Single extremity supported, During functional activity, No upper extremity supported Standing balance-Leahy Scale: Fair Standing balance comment: Able to stand statically without UE support but  benefits from 1-2 UE support for mobility due to noted  balance deficits                           ADL either performed or assessed with clinical judgement   ADL Overall ADL's : Needs assistance/impaired Eating/Feeding: Set up;Sitting   Grooming: Set up;Sitting   Upper Body Bathing: Min guard;Sitting   Lower Body Bathing: Moderate assistance;Sitting/lateral leans;Sit to/from stand   Upper Body Dressing : Min guard;Sitting   Lower Body Dressing: Moderate assistance;Sitting/lateral leans;Sit to/from stand   Toilet Transfer: Min guard;Ambulation   Toileting- Clothing Manipulation and Hygiene: Min guard       Functional mobility during ADLs: Minimal assistance;Cueing for sequencing;Cueing for safety;Rolling walker (2 wheels) General ADL Comments: Patient presenting with decreased activity tolerance, impulsivity, and decreased insights into deficits     Vision Baseline Vision/History: 1 Wears glasses (Readers) Ability to See in Adequate Light: 0 Adequate Patient Visual Report: No change from baseline       Perception     Praxis      Pertinent Vitals/Pain Pain Assessment Pain Assessment: Faces Faces Pain Scale: Hurts little more Pain Location: foley catheter with movement Pain Descriptors / Indicators: Discomfort, Grimacing, Guarding Pain Intervention(s): Limited activity within patient's tolerance, Monitored during session, Repositioned     Hand Dominance Right   Extremity/Trunk Assessment Upper Extremity Assessment Upper Extremity Assessment: Overall WFL for tasks assessed   Lower Extremity Assessment Lower Extremity Assessment: Generalized weakness;RLE deficits/detail;LLE deficits/detail RLE Deficits / Details: MMT scores of 4+ hip flexion, 4+ knee extension, 5 ankle dorsiflexion; reports intact sensation currently, but reports sensation declines sometimes in either leg (R>L); reports R leg has had more "jerking" than L, but none noted during this  session RLE Sensation: WNL LLE Deficits / Details: MMT scores of 5 hip flexion, 5 knee extension, 4- ankle dorsiflexion; reports intact sensation currently, but reports sensation declines sometimes in either leg (R>L) LLE Sensation: WNL   Cervical / Trunk Assessment Cervical / Trunk Assessment: Normal   Communication Communication Communication: HOH   Cognition Arousal/Alertness: Awake/alert Behavior During Therapy: Impulsive Overall Cognitive Status: Impaired/Different from baseline Area of Impairment: Orientation, Memory, Following commands, Safety/judgement, Awareness, Problem solving, Attention                 Orientation Level: Disoriented to, Time Current Attention Level: Selective Memory: Decreased short-term memory Following Commands: Follows one step commands consistently, Follows multi-step commands inconsistently Safety/Judgement: Decreased awareness of safety Awareness: Emergent Problem Solving: Difficulty sequencing, Slow processing General Comments: Memory deficits at baseline per step-son, but step-son reports a gradual decline in cognition recently. Patient unable to recite months backwards without maximal extra time, cues to initiate (start with December) and requiring assist when he got to March and could not complete the final 2 months without assistance     General Comments  OT providing education to wife with regard to IADLS and higher level cognitive tasks, stating patient is not allowed to drive unless cleared by an MD. Patient and wife in agreement.    Exercises     Shoulder Instructions      Home Living Family/patient expects to be discharged to:: Private residence Living Arrangements: Spouse/significant other Available Help at Discharge: Family;Available 24 hours/day (wife is unable to assist physically though) Type of Home: House Home Access: Stairs to enter [employer] of Steps: 4 Entrance Stairs-Rails: Can reach both Home Layout:  Two level;Able to live on main level with bedroom/bathroom     Bathroom Shower/Tub: Walk-in shower (threshold to  clear)   Bathroom Toilet: Standard Bathroom Accessibility: Yes   Home Equipment: Union (2 wheels);Grab bars - tub/shower;Shower seat - built in          Prior Functioning/Environment Prior Level of Function : Independent/Modified Independent;Driving;History of Falls (last six months)             Mobility Comments: Uses quad-cane majority of time but otherwise holds onto furniture. Hx of several falls recently. ADLs Comments: Manages own meds, cleans, cooks, etc. [name] reports pt has had a progressive decline in cognition recently with memory deficits at baseline        OT Problem List: Decreased activity tolerance;Impaired balance (sitting and/or standing);Decreased coordination;Decreased safety awareness;Decreased knowledge of use of DME or AE;Decreased knowledge of precautions      OT Treatment/Interventions: Self-care/ADL training;Therapeutic exercise;Energy conservation;DME and/or AE instruction;Therapeutic activities;Cognitive remediation/compensation;Patient/family education;Balance training    OT Goals(Current goals can be found in the care plan section) Acute Rehab OT Goals Patient Stated Goal: to go home OT Goal Formulation: With patient/family Time For Goal Achievement: 07/12/21 Potential to Achieve Goals: Fair ADL Goals Pt Will Perform Lower Body Bathing: Independently;sitting/lateral leans;sit to/from stand;with adaptive equipment Pt Will Perform Lower Body Dressing: Independently;with adaptive equipment;sitting/lateral leans;sit to/from stand Pt Will Transfer to Toilet: Independently;ambulating Additional ADL Goal #1: Patient will demonstrate increased cognition by being able to complete 3-5 step trail making task without cues to reorient to task. Additional ADL Goal #2: Patient will complete pillbox assessment with no errors  in order manage medications safely at home.  OT Frequency: Min 2X/week    Co-evaluation              AM-PAC OT "6 Clicks" Daily Activity     Outcome Measure Help from another person eating meals?: A Little Help from another person taking care of personal grooming?: A Little Help from another person toileting, which includes using toliet, bedpan, or urinal?: A Little Help from another person bathing (including washing, rinsing, drying)?: A Lot Help from another person to put on and taking off regular upper body clothing?: A Little Help from another person to put on and taking off regular lower body clothing?: A Lot 6 Click Score: 16   End of Session Nurse Communication: Mobility status  Activity Tolerance: Patient tolerated treatment well Patient left: in bed;with call bell/phone within reach  OT Visit Diagnosis: Unsteadiness on feet (R26.81);Repeated falls (R29.6);History of falling (Z91.81);Muscle weakness (generalized) (M62.81);Other abnormalities of gait and mobility (R26.89);Other symptoms and signs involving cognitive function                Time: 3710-6269 OT Time Calculation (min): 19 min Charges:  OT General Charges $OT Visit: 1 Visit OT Evaluation $OT Eval Moderate Complexity: 1 Mod  Corinne Ports E. Shelbe Haglund, OTR/L Acute Rehabilitation Services [phone] [place] 06/28/2021, 2:37 PM

## 2021-06-28 NOTE — TOC Progression Note (Addendum)
Transition of Care [facility]) - Progression Note    Patient Details  Name: Brandon [name] MRN: [id number] Date of Birth: [birth year]  [facility]) CM/SW Contact  Jacalyn Lefevre Edson Snowball, RN Phone Number: 06/28/2021, 2:26 PM  Clinical Narrative:    PT recommending HHPT and 3 in 1 . Spoke with patient's wife Brandon [name] via phone , she is in agreement and has no preference in home health agency.   Called Hoyle Sauer with [facility] awaiting call back.   Hoyle Sauer with [facility] has accepted referral. Will need home health orders and face to face    Ordered 3 in1 with [facility] .  Expected Discharge Plan: Home/Self Care Barriers to Discharge: Continued Medical Work up  Expected Discharge Plan and Services Expected Discharge Plan: Home/Self Care       Living arrangements for the past 2 months: Single Family Home                 DME Arranged: N/A DME Agency: NA       HH Arranged: NA           Social Determinants of Health (SDOH) Interventions    Readmission Risk Interventions     View : No data to display.

## 2021-06-28 NOTE — Progress Notes (Signed)
Bladder scan completed : 13m

## 2021-06-28 NOTE — Progress Notes (Signed)
PROGRESS NOTE    WERNER LABELLA  HBZ:[id number] DOB: Sep 05, 1940 DOA: 06/26/2021 PCP: Haydee Salter, MD    Brief Narrative:   Brandon [name] is a 81 y.o. 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  History obtained from patient and wife at bedside.  Recent completed course of prednisone and doxycycline for acute exacerbation of chronic bronchitis earlier this month.  Last week patient reported nausea, vomiting and diarrhea and unable to eat for approximately 3 days.  Was seen by PCP on 5/16 and recommended that patient push fluids and given Zofran for nausea.  Since that time, reported symptoms of nausea/vomiting/diarrhea resolved was able to start eating however he developed these jerking motions of his upper and lower extremities.  Denies LOC.  Also with several falls at home over the last 2 weeks.  Wife notes some confusion and generalized weakness, usually at baseline requires no assistance with ambulation.  Review of records notes last colonoscopy by Dr. Fuller Plan back in 2017 which noted 2 sessile polyps of the sigmoid and transverse colon that were removed, moderate diverticulosis of the sigmoid and descending colon, mild diverticulosis of the transverse colon grade 1 internal hemorrhoids.  EGD at that time noted stricture at the GE junction and hiatal hernia.   In the emergency department patient was seen to be afebrile with blood pressure 81/62 to 130/113.  Labs significant for hemoglobin 9 platelet count 125, sodium 129, potassium 6, CO2 20, BUN 86, creatinine 7.43, and CK 138.  CT scan of the head did not note any acute abnormalities.  Chest x-ray noted low lung volumes with basilar atelectasis.  Patient had been given 1 L lactated Ringer's and started on  lactated Ringer's at 125 mL/h.  Hospital service consulted for admission for acute renal failure.  Assessment & Plan:   Acute renal failure Patient presenting with a creatinine of 7.43 with BUN 86.  Creatinine previously 1.35 on 06/20/2021.  Urinalysis unrevealing.  Renal ultrasound with significant bladder distention without significant signs of hydronephrosis.  Foley catheter was placed on admission and patient started on aggressive IV fluid hydration.  Etiology likely multifactorial due to hypovolemia with recent nausea/vomiting/diarrhea and possible urinary obstruction. --Cr 7.43>>>1.81>1.55 --Discontinue Foley catheter today --Bladder scan and next void or if no void in 6 hours --Strict I's and O's --Tamsulosin 0.4 mg p.o. daily --LR at 75 mL/day --BMP daily  Symptomatic anemia Acute.  Hemoglobin 9 g/dL but previously had been 11.8 g/dL on 5/16 and had been 10.9 g/dL on 04/10/2021.  Patient did not report any bleeding, but was noted to have low blood pressures.  FOBT negative.  Seen by gastroenterology, no endoscopic evaluation recommended at this time. --Hgb 11.8>>7.8>>9.5; s/p 1u pRBC --Continue to hold Plavix --Outpatient follow-up with gastroenterology, Dr. Fuller Plan as scheduled on 07/25/2021 at 9:10 AM.   Acute metabolic encephalopathy myoclonic jerking Patient was noted to be altered from baseline with reports of jerking of the upper and lower extremities.  The level was noted to be within normal limits patient had reportedly stopped drinking alcohol 2 weeks ago unclear if symptoms are secondary to hyponatremia, kidney failure, and/or medication.  Review of office records note that gabapentin was increased to 600 mg up to 3 times daily due to severe back pain.  Question  of possibility of gabapentin toxicity as a possible cause of some of patient's symptoms.  EEG Fuhs encephalopathy, nonspecific, no seizures or epileptiform discharges noted.  Patient noted to have episodes of bilateral lower  extremity and upper extremity asynchronous twitching without calm, EEG change, most likely not epileptic. --Neurology following, appreciate assistance. --Continue to hold gabapentin --MRI brain: Pending --It appears now to be close to his typical baseline  Hyperkalemia Potassium 6.0 on admission, likely secondary to acute renal failure.  Treated with IV fluid hydration, calcium gluconate and Lokelma with resolution. --K 6.0>>4.8 today --BMP daily  Hyponatremia: Resolved Sodium 129 admission, likely secondary to hypovolemic hyponatremia in the setting of dehydration. --Na 129>>138 --Continue to encourage increased oral intake  B12 deficiency --Vitamin B12 1000 mcg subcutaneously weekly  Hyperlipidemia: --Atorvastatin 80 mg p.o. daily  Essential hypertension Home medications include amlodipine 10 mg p.o. daily, furosemide 20 mg p.o. daily, lisinopril 20 mg p.o. daily, carvedilol 12.5 mg p.o. twice daily --Continue carvedilol 12.5 mg p.o. twice daily --Holding amlodipine, furosemide, lisinopril for now given AKI --Continue monitor BP closely  COPD Home regimen includes Spiriva.  Not oxygen dependent at baseline. --Albuterol nebs PRN  Peripheral neuropathy Chronic back/neck pain On gabapentin 600 mg p.o. TID at home and prednisone at home --Holding gabapentin due to encephalopathy/renal failure as above --Prednisone 80 mg p.o. daily  CAD Patient with complex/complicated cardiac history noted to have RCA with robust collaterals from LAD and 95% LAD stenosis status post PCI/arthrectomy on 04/27/2020. Home medication regimen appears to includes aspirin, Plavix, and statin. -Hold aspirin and/or Plavix   Chronic pain Patient has a chronic history of back pain.  Home medication regimen includes oxycodone 10 mg every 6 hours as needed for pain. -Continue oxycodone as needed for pain -Held gabapentin   Polymyalgia rheumatica --Continue prednisone '8mg'$  PO daily   History of  BPH --Tamsulosin 0.4 mg p.o. daily   History of esophageal stricture, hiatal hernia, and GERD --Protonix 40 mg p.o. daily  DVT prophylaxis: SCDs Start: 06/26/21 1201    Code Status: DNR Family Communication:   Disposition Plan:  Level of care: Telemetry Medical Status is: Inpatient Remains inpatient appropriate because: Continue IV fluid hydration, voiding trial today, anticipate discharge home with home health tomorrow    Consultants:  Neurology [facility] -signed off 5/24 Palliative care  Procedures:  EEG  Antimicrobials:  None   Subjective: Patient seen examined bedside, resting comfortably.  Sitting in bedside chair.  Wants to go back to the bed.  Complaining about catheter in bladder causing discomfort.  Discussed will attempt voiding trial and removal today.  If does well with continued improvement of his renal function, anticipate discharge home tomorrow.  No other questions or concerns at this time.  Denies headache, no dizziness, no chest pain, no palpitations, no shortness of breath, no abdominal pain, no focal weakness, no fatigue, no paresthesias.  No acute events overnight per nursing staff.  Objective: Vitals:   06/27/21 1457 06/27/21 2051 06/28/21 0512 06/28/21 0740  BP: (!) 145/64 (!) 145/72 (!) 159/69 (!) 161/73  Pulse: 78 76 83 94  Resp: '17 18 18 18  '$ Temp: 97.9 F (36.6 C) 98.2 F (36.8 C) 98.2 F (36.8 C) 98.4 F (36.9 C)  TempSrc: Oral Oral Oral Oral  SpO2: 100% 100% 96% 93%  Weight:      Height:        Intake/Output Summary (Last 24 hours) at 06/28/2021 1423 Last data filed at 06/28/2021 1202 Gross per 24 hour  Intake 2589.68 ml  Output 3475 ml  Net -885.32 ml   Filed Weights   06/26/21 0844  Weight: 74.8 kg    Examination:  Physical Exam: GEN: NAD, alert and oriented x 3, wd/wn HEENT: NCAT, PERRL, EOMI, sclera clear, MMM PULM: CTAB w/o wheezes/crackles, normal respiratory effort, on room air CV: RRR w/o M/G/R GI:  abd soft, NTND, NABS, no R/G/M GU: Foley catheter noted in place, clear yellow fluid in collection canister MSK: no peripheral edema, muscle strength globally intact 5/5 bilateral upper/lower extremities NEURO: CN II-XII intact, no focal deficits, sensation to light touch intact PSYCH: normal mood/affect Integumentary: dry/intact, no rashes or wounds    Data Reviewed: I have personally reviewed following labs and imaging studies  CBC: Recent Labs  Lab 06/26/21 0904 06/26/21 1922 06/27/21 0335 06/28/21 0215  WBC 6.5  --  5.2 3.9*  NEUTROABS 4.7  --   --  2.9  HGB 9.0* 7.8* 9.5* 10.1*  HCT 29.5* 25.9* 30.5* 32.4*  MCV 81.7  --  80.9 80.4  PLT 125*  --  101* 259*   Basic Metabolic Panel: Recent Labs  Lab 06/26/21 0904 06/26/21 1922 06/27/21 0335 06/27/21 1958 06/28/21 0215  NA 129* 129* 130* 136 138  K 6.0* 5.5* 5.7* 5.6* 4.8  CL 98 100 101 108 109  CO2 20* 19* 21* 22 20*  GLUCOSE 94 97 87 89 89  BUN 86* 81* 73* 56* 50*  CREATININE 7.43* 5.62* 3.92* 1.81* 1.55*  CALCIUM 8.8* 8.3* 8.8* 8.7* 9.0  MG 2.0  --   --   --   --   PHOS 6.6*  --   --   --   --    GFR: Estimated Creatinine Clearance: 36.1 mL/min (A) (by C-G formula based on SCr of 1.55 mg/dL (H)). Liver Function Tests: Recent Labs  Lab 06/26/21 0904  AST 13*  ALT 13  ALKPHOS 103  BILITOT 0.6  PROT 6.0*  ALBUMIN 3.2*   No results for input(s): LIPASE, AMYLASE in the last 168 hours. Recent Labs  Lab 06/26/21 0925  AMMONIA 10   Coagulation Profile: No results for input(s): INR, PROTIME in the last 168 hours. Cardiac Enzymes: Recent Labs  Lab 06/26/21 0904  CKTOTAL 138   BNP (last 3 results) No results for input(s): PROBNP in the last 8760 hours. HbA1C: No results for input(s): HGBA1C in the last 72 hours. CBG: No results for input(s): GLUCAP in the last 168 hours. Lipid Profile: No results for input(s): CHOL, HDL, LDLCALC, TRIG, CHOLHDL, LDLDIRECT in the last 72 hours. Thyroid Function  Tests: Recent Labs    06/28/21 0215  TSH 0.730   Anemia Panel: Recent Labs    06/28/21 0215  VITAMINB12 >7,500*  FOLATE 15.6  FERRITIN 174  TIBC 260  IRON 13*  RETICCTPCT 1.2   Sepsis Labs: No results for input(s): PROCALCITON, LATICACIDVEN in the last 168 hours.  No results found for this or any previous visit (from the past 240 hour(s)).       Radiology Studies: DG Chest 2 View  Result Date: 06/28/2021 CLINICAL DATA:  Tremors, acute renal failure. EXAM: CHEST - 2 VIEW COMPARISON:  06/26/2021 FINDINGS: Chronic left ventricular prominence. Chronic aortic atherosclerotic calcification. Previous TAVR. The pulmonary vascularity is normal. The lungs are clear. No visible effusion. No significant bone finding. IMPRESSION: No acute process.  Resolution of basilar atelectasis. Electronically Signed   By: Nelson Chimes M.D.   On: 06/28/2021 08:07   EEG adult  Result  Date: 06/27/2021 Lora Havens, MD     06/28/2021  9:36 AM Patient Name: KOLBEY TEICHERT MRN: [id number] Epilepsy Attending: Lora Havens Referring Physician/Provider: August Albino, NP Date: 06/27/2021 Duration: 24.33 mins Patient history: 81 y.o. male with past medical history of HTN, HLD, CHF, BPH, anxiety/depression, TIA, CAD who presents to the [facility] for evaluation of jerking all over his body and confusion. EEG to evaluate for seizure Level of alertness: Awake, asleep AEDs during EEG study: None Technical aspects: This EEG study was done with scalp electrodes positioned according to the 10-20 International system of electrode placement. Electrical activity was acquired at a sampling rate of '500Hz'$  and reviewed with a high frequency filter of '70Hz'$  and a low frequency filter of '1Hz'$ . EEG data were recorded continuously and digitally stored. Description: The posterior dominant rhythm consists of '8Hz'$  activity of moderate voltage (25-35 uV) seen predominantly in posterior head regions, symmetric and reactive to eye opening and  eye closing. Sleep was characterized by vertex waves, sleep spindles (12 to 14 Hz), maximal frontocentral region. EEG showed intermittent generalized  3 to 6 Hz theta-delta slowing. Patient was noted to have episodes of bilateral lower extremity and upper extremity asynchronous twitching.  Concomitant EEG before, during and after the event did not show any EEG changes suggest seizure. Hyperventilation and photic stimulation were not performed.   ABNORMALITY - Intermittent slow, generalized IMPRESSION: This study is suggestive of mild diffuse encephalopathy, nonspecific etiology. No seizures or epileptiform discharges were seen throughout the recording. Patient was noted to have episodes of bilateral lower extremity and upper extremity asynchronous twitching without concomitant EEG change. These episodes were most likely NOT epileptic. Priyanka Barbra Sarks        Scheduled Meds:  allopurinol  100 mg Oral Daily   atorvastatin  80 mg Oral QHS   Chlorhexidine Gluconate Cloth  6 each Topical Q0600   cyanocobalamin  1,000 mcg Subcutaneous Weekly   ezetimibe  10 mg Oral Daily   pantoprazole  40 mg Oral Daily   predniSONE  8 mg Oral Q breakfast   sodium chloride flush  3 mL Intravenous Q12H   tamsulosin  0.4 mg Oral QPC supper   [START ON 06/29/2021] thiamine  100 mg Oral Daily   [START ON 06/29/2021] umeclidinium bromide  1 puff Inhalation Daily   Continuous Infusions:  lactated ringers 100 mL/hr at 06/27/21 2214     LOS: 2 days    Time spent: 53 minutes spent on chart review, discussion with nursing staff, consultants, updating family and interview/physical exam; more than 50% of that time was spent in counseling and/or coordination of care.    Blakelyn Dinges J [place] DO [facility] Available via Epic secure chat 7am-7pm After these hours, please refer to coverage provider listed on [email or web address] 06/28/2021, 2:23 PM

## 2021-06-28 NOTE — Progress Notes (Signed)
Neurology Progress Note   S:// Patient is laying in bed resting. He is more lethargic today than yesterday when I saw him. No family at bedside. His mentation is much better today. He states he feels better today, not many jerking movements and he does not feel as confused. MRI Brain not done yet.    O:// Current vital signs: BP (!) 161/73 (BP Location: Left Arm)   Pulse 94   Temp 98.4 F (36.9 C) (Oral)   Resp 18   Ht 5' 6.5" (1.689 m)   Wt 74.8 kg   SpO2 93%   BMI 26.23 kg/m  Vital signs in last 24 hours: Temp:  [97.9 F (36.6 C)-98.4 F (36.9 C)] 98.4 F (36.9 C) (05/24 0740) Pulse Rate:  [76-94] 94 (05/24 0740) Resp:  [17-18] 18 (05/24 0740) BP: (145-161)/(64-73) 161/73 (05/24 0740) SpO2:  [93 %-100 %] 93 % (05/24 0740)  GENERAL: lethargic, awakens easily alert in NAD HEENT: - Normocephalic and atraumatic, dry mm LUNGS - Clear to auscultation bilaterally with no wheezes CV - S1S2 RRR, no m/r/g, equal pulses bilaterally. ABDOMEN - Soft, nontender, nondistended with normoactive BS Ext: warm, well perfused, intact peripheral pulses, no edema NEURO:  Mental Status: AA&Ox4 Language: speech is clear  Naming, repetition, fluency, and comprehension intact. Cranial Nerves: PERRL 2 mm/brisk. EOMI, visual fields full, no facial asymmetry, facial sensation intact, hearing intact, tongue/uvula/soft palate midline, normal sternocleidomastoid and trapezius muscle strength. No evidence of tongue atrophy or fibrillations Motor: 5/5 in all 4 extremities. 2-3 jerks in upper extremities during exam  Tone: is normal and bulk is normal Sensation- Intact to light touch bilaterally Coordination: FTN intact bilaterally, no ataxia in BLE. Gait- deferred  Medications  Current Facility-Administered Medications:    acetaminophen (TYLENOL) tablet 650 mg, 650 mg, Oral, Q6H PRN **OR** acetaminophen (TYLENOL) suppository 650 mg, 650 mg, Rectal, Q6H PRN, Tamala Julian, Rondell A, MD   albuterol  (PROVENTIL) (2.5 MG/3ML) 0.083% nebulizer solution 2.5 mg, 2.5 mg, Nebulization, Q6H PRN, Hosie Poisson, MD   atorvastatin (LIPITOR) tablet 80 mg, 80 mg, Oral, QHS, Hosie Poisson, MD, 80 mg at 06/27/21 2209   Chlorhexidine Gluconate Cloth 2 % PADS 6 each, 6 each, Topical, Q0600, Hosie Poisson, MD, 6 each at 06/28/21 0423   cyanocobalamin ((VITAMIN B-12)) injection 1,000 mcg, 1,000 mcg, Subcutaneous, Weekly, Carl Best M, NP, 1,000 mcg at 06/27/21 1144   ezetimibe (ZETIA) tablet 10 mg, 10 mg, Oral, Daily, Hosie Poisson, MD, 10 mg at 06/28/21 0918   fluticasone (FLONASE) 50 MCG/ACT nasal spray 2 spray, 2 spray, Each Nare, Daily PRN, Hosie Poisson, MD   ipratropium (ATROVENT) 0.06 % nasal spray 2 spray, 2 spray, Each Nare, BID PRN, Hosie Poisson, MD   lactated ringers infusion, , Intravenous, Continuous, Fuller Plan A, MD, Last Rate: 100 mL/hr at 06/27/21 2214, New Bag at 06/27/21 2214   ondansetron (ZOFRAN) tablet 4 mg, 4 mg, Oral, Q6H PRN **OR** ondansetron (ZOFRAN) injection 4 mg, 4 mg, Intravenous, Q6H PRN, Tamala Julian, Rondell A, MD   oxyCODONE (Oxy IR/ROXICODONE) immediate release tablet 10 mg, 10 mg, Oral, Q6H PRN, Tamala Julian, Rondell A, MD, 10 mg at 06/28/21 0257   pantoprazole (PROTONIX) EC tablet 40 mg, 40 mg, Oral, Daily, Kennedy-Smith, Colleen M, NP, 40 mg at 06/28/21 0918   phenol (CHLORASEPTIC) mouth spray 1 spray, 1 spray, Mouth/Throat, PRN, Smith, Rondell A, MD   sodium chloride flush (NS) 0.9 % injection 3 mL, 3 mL, Intravenous, Q12H, Smith, Rondell A, MD, 3 mL at 06/28/21 [phone]  tamsulosin (FLOMAX) capsule 0.4 mg, 0.4 mg, Oral, QPC supper, Hosie Poisson, MD, 0.4 mg at 06/27/21 1832   thiamine (B-1) injection 100 mg, 100 mg, Intravenous, Daily, Bhagat, Srishti L, MD, 100 mg at 06/28/21 7915   Imaging I have reviewed images in epic and the results pertinent to this consultation are:  CT-scan of the brain No acute process  LABS: NA 138->136->130->129->129 (on admission) K  4.8->5.6->5.7->5.5->6.0 (on admission) BUN 50->56->73->81->86 (on admission) Cr 1.55->1.81->3.92->5.62->7.43 (on admission) Vit B12 >7500 (not be accurate given that it was collected approximately 12 hours after 1000 mcg IM was given) Folate 15.6 Vit E pending  Vit B1 pending  TSH 0.730  EEG 5/23:  This study is suggestive of mild diffuse encephalopathy, nonspecific etiology. No seizures or epileptiform discharges were seen throughout the recording.  Patient was noted to have episodes of bilateral lower extremity and upper extremity asynchronous twitching without concomitant EEG change. These episodes were most likely NOT epileptic.   Assessment:  Brandon [name] is a 81 y.o. male with past medical history of HTN, HLD, CHF, BPH, anxiety/depression, TIA, CAD who presents to the [facility] for evaluation of jerking all over his body and confusion. Per wife he had nausea and vomiting and diarrhea last week and was unable to keep anything down last week for a few days. He also had been having multiple falls over the last few weeks. She states 2 days ago she noticed his bilateral arms and legs to have jerking movements.   Myoclonic jerking most likely related to ARF and less likely gabapentin toxicity, although given gabapentin is renally cleared this may be playing some role secondary to the renal insufficiency.  Confusion most likely related to metabolic encephalopathy as well (renal failure and gabapentin toxicity), though there may be some element of delirium in the setting of his age and hospitalization   Recommendations: - Delirium precautions  - Continue Thiamine daily  - MRI Brain ( already ordered) - Continue to hold home gabapentin, when patient's renal function has stabilized, adjust home dose for renal function - Continue to correct metabolic abnormalities per primary  - Continue to [facility] patient per primary team  - Neurology will follow up MRI brain, but otherwise will be available on an  as-needed basis going forward.  Please reach out if any questions or concerns arise   Beulah Gandy DNP, [id number]-AG

## 2021-06-28 NOTE — Progress Notes (Addendum)
Daily Progress Note  Hospital Day: 3  Chief Complaint: anemia  Brief History Brandon [name] is a 81 y.o. 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 Admitted with jerking of body and confusion and AKI. GI consulted for anemia.    Assessment   81 yo male with multiple medical problems who we saw in consult yesterday for acute on chronic anemia without overt GI blood loss.  (Baseline) hgb 11.8 >> 7.8. Improved to 9.5 post 1 u PRBC .  Severe Vitamin b12 deficiency ( 151) in Oct 2022.  Received B12 injection yesterday, B12 > 7500 today   Nausea / vomiting / diarrhea  Possibly acute viral gastroenteritis, Resolved  AKI in setting of N/V/D illness.  Cr improving, 1.55 today. GFR 46  AMS / generalized jerking. Neurology following. Head CT negative. Jerking felt to be related to AKI. EEG suggests mild diffuse encephalopathy.   AS s/p TAVR on 06/2020. Last dose of Plavix was on 06/25/2021   Plan   Endoscopic evaluation not recommended at this point in absence of GI bleeding but will consider as outpatient. Follow up with Dr. Fuller Plan at 07/25/21 at 9:10am  Subjective: Didn't sleep well so took a pain pill to rest and now feels groggy   Objective  **Most recent studies  EGD 03/23/2015: 1. Short stricture at the gastroesophageal junction; dilated savary dilators over guidewire 2. Small hiatal hernia   Colonoscopy 03/23/2015:  1. Two sessile polyps in the sigmoid colon and transverse colon; polypectomies performed with a cold snare 2.  Moderate diverticulosis in the sigmoid colon and descending colon 3. Mild diverticulosis in the transverse colon 4. Grade l internal hemorrhoids TUBULAR ADENOMAS (X2). - NO HIGH GRADE DYSPLASIA OR INVASIVE MALIGNANCY IDENTIFIED.    Imaging:  DG Chest 2 View  Result Date: 06/28/2021 CLINICAL DATA:  Tremors, acute renal failure. EXAM: CHEST - 2 VIEW COMPARISON:  06/26/2021 FINDINGS: Chronic left ventricular prominence. Chronic aortic atherosclerotic calcification. Previous TAVR. The pulmonary vascularity is normal. The lungs are clear. No visible effusion. No significant bone finding. IMPRESSION: No acute process.  Resolution of basilar atelectasis. Electronically Signed   By: Nelson Chimes M.D.   On: 06/28/2021 08:07   CT Head Wo Contrast  Result Date: 06/26/2021 CLINICAL DATA:  Mental status change, unknown cause is EXAM: CT HEAD WITHOUT CONTRAST TECHNIQUE: Contiguous axial images were obtained from the base of the skull through the vertex without intravenous contrast. RADIATION DOSE REDUCTION: This exam was performed according to the departmental dose-optimization program which includes automated exposure control, adjustment of the mA and/or kV according to patient size and/or use of iterative reconstruction technique. COMPARISON:  None Available. FINDINGS: Brain: No acute intracranial hemorrhage. No focal mass lesion. No CT evidence of acute infarction. No midline shift or mass effect. No hydrocephalus. Basilar cisterns are patent. There are periventricular and subcortical white matter hypodensities. Generalized cortical atrophy. Vascular: No hyperdense vessel or unexpected calcification. Skull: Normal. Negative for fracture or focal lesion. Sinuses/Orbits: Paranasal sinuses and mastoid air cells are clear. Orbits are clear. Other: None. IMPRESSION: No acute intracranial findings. Electronically Signed   By: Helane Gunther.D.  On: 06/26/2021 09:33   US RENAL  Result Date: 06/26/2021 CLINICAL  DATA:  Acute renal dysfunction EXAM: RENAL / URINARY TRACT ULTRASOUND COMPLETE COMPARISON:  CT done on 03/24/2021 and abdominal sonogram done on 03/17/2020 FINDINGS: Right Kidney: Renal measurements: 12.7 x 6.5 x 5.2 cm = volume: 226.1 mL. Echogenicity within normal limits. No mass or hydronephrosis visualized. Left Kidney: Renal measurements: 12.9 x 6.4 x 5.2 cm = volume: 224.8 mL. Echogenicity within normal limits. No mass or hydronephrosis visualized. Bladder: Appears normal for degree of bladder distention. Other: None. IMPRESSION: No sonographic abnormality is seen in the kidneys. There is no hydronephrosis. Electronically Signed   By: Elmer Picker M.D.   On: 06/26/2021 13:27   DG Chest Port 1 View  Result Date: 06/26/2021 CLINICAL DATA:  Weakness. EXAM: PORTABLE CHEST 1 VIEW COMPARISON:  03/03/2021 FINDINGS: 0933 hours. Low volume film. Streaky opacity in the lung bases suggest atelectasis. Cardiopericardial silhouette is at upper limits of normal for size. No pulmonary edema or focal airspace consolidation. No substantial pleural effusion. Telemetry leads overlie the chest. IMPRESSION: Low volume film with basilar atelectasis. Electronically Signed   By: Misty Stanley M.D.   On: 06/26/2021 09:46   EEG adult  Result Date: 06/27/2021 Lora Havens, MD     06/28/2021  9:36 AM Patient Name: Brandon [name] MRN: [id number] Epilepsy Attending: Lora Havens Referring Physician/Provider: August Albino, NP Date: 06/27/2021 Duration: 24.33 mins Patient history: 81 y.o. male with past medical history of HTN, HLD, CHF, BPH, anxiety/depression, TIA, CAD who presents to the [facility] for evaluation of jerking all over his body and confusion. EEG to evaluate for seizure Level of alertness: Awake, asleep AEDs during EEG study: None Technical aspects: This EEG study was done with scalp electrodes positioned according to the 10-20 International system of electrode placement. Electrical activity was acquired at a  sampling rate of '500Hz'$  and reviewed with a high frequency filter of '70Hz'$  and a low frequency filter of '1Hz'$ . EEG data were recorded continuously and digitally stored. Description: The posterior dominant rhythm consists of '8Hz'$  activity of moderate voltage (25-35 uV) seen predominantly in posterior head regions, symmetric and reactive to eye opening and eye closing. Sleep was characterized by vertex waves, sleep spindles (12 to 14 Hz), maximal frontocentral region. EEG showed intermittent generalized  3 to 6 Hz theta-delta slowing. Patient was noted to have episodes of bilateral lower extremity and upper extremity asynchronous twitching.  Concomitant EEG before, during and after the event did not show any EEG changes suggest seizure. Hyperventilation and photic stimulation were not performed.   ABNORMALITY - Intermittent slow, generalized IMPRESSION: This study is suggestive of mild diffuse encephalopathy, nonspecific etiology. No seizures or epileptiform discharges were seen throughout the recording. Patient was noted to have episodes of bilateral lower extremity and upper extremity asynchronous twitching without concomitant EEG change. These episodes were most likely NOT epileptic. Lora Havens    Lab Results: Recent Labs    06/26/21 0904 06/26/21 1922 06/27/21 0335 06/28/21 0215  WBC 6.5  --  5.2 3.9*  HGB 9.0* 7.8* 9.5* 10.1*  HCT 29.5* 25.9* 30.5* 32.4*  PLT 125*  --  101* 104*   BMET Recent Labs    06/27/21 0335 06/27/21 1958 06/28/21 0215  NA 130* 136 138  K 5.7* 5.6* 4.8  CL 101 108 109  CO2 21* 22 20*  GLUCOSE 87 89 89  BUN 73* 56* 50*  CREATININE 3.92* 1.81* 1.55*  CALCIUM 8.8*  8.7* 9.0   LFT Recent Labs    06/26/21 0904  PROT 6.0*  ALBUMIN 3.2*  AST 13*  ALT 13  ALKPHOS 103  BILITOT 0.6   PT/INR No results for input(s): LABPROT, INR in the last 72 hours.   Scheduled inpatient medications:   atorvastatin  80 mg Oral QHS   Chlorhexidine Gluconate Cloth  6 each  Topical Q0600   cyanocobalamin  1,000 mcg Subcutaneous Weekly   ezetimibe  10 mg Oral Daily   pantoprazole  40 mg Oral Daily   sodium chloride flush  3 mL Intravenous Q12H   tamsulosin  0.4 mg Oral QPC supper   thiamine injection  100 mg Intravenous Daily   Continuous inpatient infusions:   lactated ringers 100 mL/hr at 06/27/21 2214   PRN inpatient medications: acetaminophen **OR** acetaminophen, albuterol, fluticasone, ipratropium, ondansetron **OR** ondansetron (ZOFRAN) IV, oxyCODONE, phenol  Vital signs in last 24 hours: Temp:  [97.9 F (36.6 C)-98.4 F (36.9 C)] 98.4 F (36.9 C) (05/24 0740) Pulse Rate:  [76-94] 94 (05/24 0740) Resp:  [17-18] 18 (05/24 0740) BP: (145-161)/(64-73) 161/73 (05/24 0740) SpO2:  [93 %-100 %] 93 % (05/24 0740)    Intake/Output Summary (Last 24 hours) at 06/28/2021 1009 Last data filed at 06/28/2021 0500 Gross per 24 hour  Intake 2589.68 ml  Output 3075 ml  Net -485.32 ml     Physical Exam:  General: Alert male in NAD Heart:  Regular rate and rhythm. No lower extremity edema Pulmonary: Normal respiratory effort Abdomen: Soft, nondistended, nontender. Normal bowel sounds.  Neurologic: Alert and oriented Psych: Pleasant. Cooperative.    Intake/Output from previous day: 05/23 0701 - 05/24 0700 In: 2589.7 [I.V.:2589.7] Out: 3075 [Urine:3075] Intake/Output this shift: No intake/output data recorded.    Principal Problem:   ARF (acute renal failure) (HCC) Active Problems:   Gastroesophageal reflux disease   History of benign prostatic hyperplasia   Essential hypertension   Symptomatic anemia   Chronic pain syndrome   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HCC)   Esophageal stricture   Acute respiratory failure with hypoxia (HCC)   Hyponatremia   Hyperkalemia   Acute metabolic encephalopathy   Myoclonic jerking     LOS: 2 days   Tye Savoy ,NP 06/28/2021, 10:09 AM     Attending physician's note   I have taken a history,  reviewed the chart and examined the patient. I performed a substantive portion of this encounter, including complete performance of at least one of the key components, in conjunction with the APP. I agree with the APP's note, impression and recommendations.    Renal function improving with IV fluids Hemoglobin has remained stable s/p 1 unit PRBC transfusion.  Fecal Hemoccult negative.  No history of any melena or blood in stool  He has severe B12 deficiency based on prior lab. Elevated B12 this a.m., B12 is an acute phase reactant and is probably not an accurate reflection of his true B12 level in this setting Continue B12 injection 1000 mcg weekly for 3 additional doses and recheck B12 as outpatient in 2 to 3 months, can be done through PMD office  Iron deficiency, he has had GI work-up in the past.  No high risk lesions.  We will arrange for GI outpatient follow-up to discuss further if patient or his wife are interested in pursuing it.  At this point they do not want him to undergo any interventions  Nausea, vomiting and diarrhea has resolved.  Possible acute viral gastroenteritis  GI will  sign off, available if have any questions  The patient was provided an opportunity to ask questions and all were answered. The patient agreed with the plan and demonstrated an understanding of the instructions.   Damaris Hippo , MD [phone]

## 2021-06-28 NOTE — Progress Notes (Signed)
BSC delivered to pt room for discharge

## 2021-06-28 NOTE — Evaluation (Signed)
Physical Therapy Evaluation Patient Details Name: Brandon [name] MRN: [id number] DOB: 29-Jul-1940 Today's Date: 06/28/2021  History of Present Illness  Pt is a 81 y.o. male who presented 06/26/21 with myoclonic jerking. Pt with acute renal failure, symptomatic anemia, acute repiratory failure with hypoxia, hyperkalemia, and acte metabolic encephalopathy, and hyponatremia. CT of head was negative for acute intracranial abnormalities. PMH: HTN, HLD, chronic diastolic CHF, CAD, aortic stenosis s/p TAVR on Plavix, TIA, COPD, chronic back pain, abdominal anuerysm, hepatitis B   Clinical Impression  Pt presents with condition above and deficits mentioned below, see PT Problem List. PTA, he was living with his wife in a  2-level house (stays on main level) with 4 STE. At baseline, pt is mod I using a quad-cane majority of time but otherwise holds onto furniture. 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 He displays deficits in orientation, awareness, and memory alone with deficits in activity tolerance, balance, and lower extremity strength that place him at risk for subsequent falls. Pt did not display any "jerking" of his limbs, any LOB, or any physical need for assistance with mobility this session. Confirmed with pt's step-son that the pt's wife can assist with cognitive tasks and supervise pt at home at d/c. She is unable to physically assist him safely though. As pt is primarily needing assistance for cognitive tasks and the wife can provide this, recommending HHPT to address his deficits to reduce his risk for falls. Will continue to follow acutely.     Recommendations for follow up therapy are one component of a multi-disciplinary discharge planning process, led by the attending physician.  Recommendations may be updated based on patient status, additional functional criteria and insurance authorization.  Follow Up Recommendations Home health PT     Assistance Recommended at Discharge Frequent or constant Supervision/Assistance  Patient can return home with the following  A little help with bathing/dressing/bathroom;Assistance with cooking/housework;Direct supervision/assist for medications management;Direct supervision/assist for financial management;Assist for transportation    Equipment Recommendations BSC/3in1  Recommendations for Other Services       Functional Status Assessment Patient has had a recent decline in their functional status and demonstrates the ability to make significant improvements in function in a reasonable and predictable amount of time.     Precautions / Restrictions Precautions Precautions: Fall Precaution Comments: several recent falls PTA Restrictions Weight Bearing Restrictions: No      Mobility  Bed Mobility Overal bed mobility: Needs Assistance Bed Mobility: Supine to Sit     Supine to sit: Min guard, HOB elevated     General bed mobility comments: Min guard for safety, using bed rails, HOB elevated    Transfers Overall transfer level: Needs assistance Equipment used: Rolling walker (2 wheels), 1 person hand held assist Transfers: Sit to/from Stand, Bed to chair/wheelchair/BSC Sit to Stand: Min guard   Step pivot transfers: Min guard       General transfer comment: Min guard for safety with transfers to stand using RW or with transfers to stand and step recliner <> commode with intermittent 1-2 UE support on furniture, no LOB    Ambulation/Gait Ambulation/Gait assistance: Min guard Gait Distance (Feet): 230 Feet (x4 bouts of ~230 ft > ~20 ft > ~3 ft > ~3 ft) Assistive device: Rolling walker (2 wheels), 1 person hand held assist Gait Pattern/deviations: Step-through pattern, Decreased stride length, Trunk flexed, Narrow base of support Gait velocity: reduced Gait velocity interpretation: <1.8 ft/sec, indicate of risk  for recurrent falls   General Gait Details: Pt with slow,  small steps and narrow BOS when ambulating with RW the first 2 bouts. Held onto furniture to step recliner <> commode final 2 bouts. No LOB, min guard for safety  Stairs Stairs: Yes Stairs assistance: Min guard Stair Management: One rail Right, One rail Left, Step to pattern, Forwards Number of Stairs: 4 General stair comments: Ascends with R rail and descends with L rail with step-to pattern, no LOB, min guard for safety  Wheelchair Mobility    Modified Rankin (Stroke Patients Only)       Balance Overall balance assessment: Needs assistance Sitting-balance support: No upper extremity supported, Feet supported Sitting balance-Leahy Scale: Fair     Standing balance support: Bilateral upper extremity supported, Single extremity supported, During functional activity, No upper extremity supported Standing balance-Leahy Scale: Fair Standing balance comment: Able to stand statically without UE support but benefits from 1-2 UE support for mobility due to noted balance deficits                             Pertinent Vitals/Pain Pain Assessment Pain Assessment: No/denies pain    Home Living Family/patient expects to be discharged to:: Private residence Living Arrangements: Spouse/significant other Available Help at Discharge: Family;Available 24 hours/day (wife is unable to assist physically though) Type of Home: House Home Access: Stairs to enter Entrance Stairs-Rails: Can reach both Entrance Stairs-Number of Steps: 4   Home Layout: Two level;Able to live on main level with bedroom/bathroom Home Equipment: [place] (2 wheels);Grab bars - tub/shower;Shower seat - built in      Prior Function Prior Level of Function : Independent/Modified Independent;Driving;History of Falls (last six months)             Mobility Comments: Uses quad-cane majority of time but otherwise holds onto furniture. Hx of several falls recently. ADLs Comments: Manages own  meds, cleans, cooks, etc. [name] reports pt has had a progressive decline in cognition recently with memory deficits at baseline     Hand Dominance   Dominant Hand: Right    Extremity/Trunk Assessment   Upper Extremity Assessment Upper Extremity Assessment: Defer to OT evaluation    Lower Extremity Assessment Lower Extremity Assessment: Generalized weakness;RLE deficits/detail;LLE deficits/detail RLE Deficits / Details: MMT scores of 4+ hip flexion, 4+ knee extension, 5 ankle dorsiflexion; reports intact sensation currently, but reports sensation declines sometimes in either leg (R>L); reports R leg has had more "jerking" than L, but none noted during this session RLE Sensation: WNL LLE Deficits / Details: MMT scores of 5 hip flexion, 5 knee extension, 4- ankle dorsiflexion; reports intact sensation currently, but reports sensation declines sometimes in either leg (R>L) LLE Sensation: WNL       Communication   Communication: HOH  Cognition Arousal/Alertness: Awake/alert Behavior During Therapy: Flat affect Overall Cognitive Status: Impaired/Different from baseline Area of Impairment: Orientation, Memory, Following commands, Safety/judgement, Awareness, Problem solving, Attention                 Orientation Level: Disoriented to, Time Current Attention Level: Selective Memory: Decreased short-term memory Following Commands: Follows one step commands consistently, Follows multi-step commands inconsistently Safety/Judgement: Decreased awareness of safety Awareness: Emergent Problem Solving: Difficulty sequencing, Slow processing General Comments: Memory deficits at baseline per step-son, but step-son reports a gradual decline in cognition recently. Pt believes today is "June". Pt with flat affect and needs reminders to not stand  without assistance due to his safety concerns. Pt reports awareness that his cognition is not at his baseline.        General Comments General  comments (skin integrity, edema, etc.): Educated pt and step-son on need to use RW and have supervision at home for safety at this time. Pt also needs assistance for higher cog tasks, like meds and finances for safety. They verbalized understanding. Step-son reports pt still drinks, smokes, and uses oxi at home, Therapist, sports and MD notified.    Exercises     Assessment/Plan    PT Assessment Patient needs continued PT services  PT Problem List Decreased strength;Decreased activity tolerance;Decreased balance;Decreased mobility;Decreased cognition;Decreased safety awareness       PT Treatment Interventions DME instruction;Gait training;Stair training;Functional mobility training;Therapeutic activities;Therapeutic exercise;Balance training;Neuromuscular re-education;Cognitive remediation;Patient/family education    PT Goals (Current goals can be found in the Care Plan section)  Acute Rehab PT Goals Patient Stated Goal: to improve PT Goal Formulation: With patient/family Time For Goal Achievement: 07/12/21 Potential to Achieve Goals: Good    Frequency Min 3X/week     Co-evaluation               AM-PAC PT "6 Clicks" Mobility  Outcome Measure Help needed turning from your back to your side while in a flat bed without using bedrails?: A Little Help needed moving from lying on your back to sitting on the side of a flat bed without using bedrails?: A Little Help needed moving to and from a bed to a chair (including a wheelchair)?: A Little Help needed standing up from a chair using your arms (e.g., wheelchair or bedside chair)?: A Little Help needed to walk in hospital room?: A Little Help needed climbing 3-5 steps with a railing? : A Little 6 Click Score: 18    End of Session Equipment Utilized During Treatment: Gait belt Activity Tolerance: Patient tolerated treatment well Patient left: in chair;with call bell/phone within reach;with chair alarm set;with family/visitor present Nurse  Communication: Mobility status (yellow/orange runny/gooey BM) PT Visit Diagnosis: Unsteadiness on feet (R26.81);Other abnormalities of gait and mobility (R26.89);Difficulty in walking, not elsewhere classified (R26.2);History of falling (Z91.81);Muscle weakness (generalized) (M62.81)    Time: 1610-9604 PT Time Calculation (min) (ACUTE ONLY): 48 min   Charges:   PT Evaluation $PT Eval Moderate Complexity: 1 Mod PT Treatments $Gait Training: 8-22 mins $Therapeutic Activity: 8-22 mins        Moishe Spice, PT, DPT Acute Rehabilitation Services  Pager: [phone] Office: [phone]   Orvan Falconer 06/28/2021, 11:38 AM

## 2021-06-28 NOTE — Plan of Care (Signed)

## 2021-06-28 NOTE — Progress Notes (Incomplete)
Initial Nutrition Assessment  DOCUMENTATION CODES:     INTERVENTION:  ***   NUTRITION DIAGNOSIS:    related to   as evidenced by  .  GOAL:     MONITOR:     REASON FOR ASSESSMENT:  Malnutrition Screening Tool, Consult Assessment of nutrition requirement/status  ASSESSMENT:   Pt with hx of HTN, HLD, TIA, CAD, GERD, diverticulosis, CAD, hx tonsillar cancer s/p radiation presented  ***   Average Meal Intake: ***-***: ***% intake x *** recorded meals  Nutritionally Relevant Medications: Scheduled Meds:  allopurinol  100 mg Oral Daily   atorvastatin  80 mg Oral QHS   Chlorhexidine Gluconate Cloth  6 each Topical Q0600   cyanocobalamin  1,000 mcg Subcutaneous Weekly   ezetimibe  10 mg Oral Daily   pantoprazole  40 mg Oral Daily   predniSONE  8 mg Oral Q breakfast   sodium chloride flush  3 mL Intravenous Q12H   tamsulosin  0.4 mg Oral QPC supper   [START ON 06/29/2021] thiamine  100 mg Oral Daily   umeclidinium bromide  1 puff Inhalation Daily   Continuous Infusions:  lactated ringers 100 mL/hr at 06/27/21 2214   PRN Meds:.acetaminophen **OR** acetaminophen, albuterol, fluticasone, ipratropium, ondansetron **OR** ondansetron (ZOFRAN) IV, oxyCODONE, phenol  Labs Reviewed: ***  NUTRITION - FOCUSED PHYSICAL EXAM: {RD Focused Exam List:21252}  Diet Order:   Diet Order             Diet regular Room service appropriate? Yes; Fluid consistency: Thin  Diet effective now                   EDUCATION NEEDS:     Skin:     Last BM:     Height:  Ht Readings from Last 1 Encounters:  06/26/21 5' 6.5" (1.689 m)    Weight:  Wt Readings from Last 1 Encounters:  06/26/21 74.8 kg    Ideal Body Weight:     BMI:  Body mass index is 26.23 kg/m.  Estimated Nutritional Needs:  Kcal:    Protein:    Fluid:      Ranell Patrick, RD, LDN Clinical Dietitian RD pager # available in [place]  After hours/weekend pager # available in [facility]

## 2021-06-29 DIAGNOSIS — G894 Chronic pain syndrome: Secondary | ICD-10-CM | POA: Diagnosis not present

## 2021-06-29 DIAGNOSIS — G9341 Metabolic encephalopathy: Secondary | ICD-10-CM | POA: Diagnosis not present

## 2021-06-29 DIAGNOSIS — N179 Acute kidney failure, unspecified: Secondary | ICD-10-CM | POA: Diagnosis not present

## 2021-06-29 DIAGNOSIS — J9601 Acute respiratory failure with hypoxia: Secondary | ICD-10-CM | POA: Diagnosis not present

## 2021-06-29 LAB — BASIC METABOLIC PANEL
Anion gap: 7 (ref 5–15)
BUN: 34 mg/dL — ABNORMAL HIGH (ref 8–23)
CO2: 25 mmol/L (ref 22–32)
Calcium: 8.8 mg/dL — ABNORMAL LOW (ref 8.9–10.3)
Chloride: 107 mmol/L (ref 98–111)
Creatinine, Ser: 1.13 mg/dL (ref 0.61–1.24)
GFR, Estimated: >60 mL/min (ref >60)
Glucose, Bld: 113 mg/dL — ABNORMAL HIGH (ref 70–99)
Potassium: 4.8 mmol/L (ref 3.5–5.1)
Sodium: 139 mmol/L (ref 135–145)

## 2021-06-29 LAB — VITAMIN B1: Vitamin B1 (Thiamine): 77 nmol/L (ref 66.5–200.0)

## 2021-06-29 MED ORDER — GABAPENTIN 600 MG PO TABS: 300.0000 mg | ORAL_TABLET | Freq: Two times a day (BID) | ORAL | 5 refills | Status: DC

## 2021-06-29 MED ORDER — VITAMIN B-12 1000 MCG PO TABS: 1000.0000 ug | ORAL_TABLET | Freq: Every day | ORAL | 2 refills | Status: AC

## 2021-06-29 MED ORDER — THIAMINE HCL 100 MG PO TABS: 100.0000 mg | ORAL_TABLET | Freq: Every day | ORAL | 2 refills | Status: AC

## 2021-06-29 MED ORDER — PANTOPRAZOLE SODIUM 40 MG PO TBEC: 40.0000 mg | DELAYED_RELEASE_TABLET | Freq: Every day | ORAL | 2 refills | Status: DC

## 2021-06-29 NOTE — Plan of Care (Signed)

## 2021-06-29 NOTE — Progress Notes (Signed)
Explained discharge instructions and reviewed next medication administration times and follow up appointments. Patient and wife verbalized understanding instructions. All belongings are in the patient's possession. Patient's IV and tele was removed by staff RN. Transported to [employer] [facility].

## 2021-06-29 NOTE — Discharge Summary (Signed)
Physician Discharge Summary  Brandon [name] VCB:[id number] DOB: 12-12-1940 DOA: 06/26/2021  PCP: Haydee Salter, MD  Admit date: 06/26/2021 Discharge date: 06/29/2021  Admitted From: Home Disposition: Home  Recommendations for Outpatient Follow-up:  Follow up with PCP in 1-2 weeks Follow-up with gastroenterology, Dr. Fuller Plan as scheduled on 07/25/2021 at 9:10 AM Please obtain BMP in one week to ensure renal function remained stable  Home Health: PT Equipment/Devices: 3 in 1 bedside commode  Discharge Condition: Stable CODE STATUS: DNR Diet recommendation: Heart healthy diet  History of present illness:  Brandon [name] is a 81 y.o. 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  History obtained from patient and wife at bedside.  Recent completed course of prednisone and doxycycline for acute exacerbation of chronic bronchitis earlier this month.  Last week patient reported nausea, vomiting and diarrhea and unable to eat for approximately 3 days.  Was seen by PCP on 5/16 and recommended that patient push fluids and given Zofran for nausea.  Since that time, reported symptoms of nausea/vomiting/diarrhea resolved was able to start eating however he developed these jerking motions of his upper and lower extremities.  Denies LOC.  Also with several falls at home over the last 2 weeks.  Wife notes some confusion and generalized weakness, usually at baseline requires no assistance with ambulation.   Review of records notes last colonoscopy by Dr. Fuller Plan back in 2017 which noted 2 sessile polyps of the sigmoid and transverse colon that were removed, moderate diverticulosis of the sigmoid and descending colon, mild diverticulosis of the transverse colon grade 1 internal  hemorrhoids.  EGD at that time noted stricture at the GE junction and hiatal hernia.   In the emergency department patient was seen to be afebrile with blood pressure 81/62 to 130/113.  Labs significant for hemoglobin 9 platelet count 125, sodium 129, potassium 6, CO2 20, BUN 86, creatinine 7.43, and CK 138.  CT scan of the head did not note any acute abnormalities.  Chest x-ray noted low lung volumes with basilar atelectasis.  Patient had been given 1 L lactated Ringer's and started on lactated Ringer's at 125 mL/h.  Hospital service consulted for admission for acute renal failure.  Hospital course:  Acute renal failure Patient presenting with a creatinine of 7.43 with BUN 86.  Creatinine previously 1.35 on 06/20/2021.  Urinalysis unrevealing.  Renal ultrasound with significant bladder distention without significant signs of hydronephrosis.  Foley catheter was placed on admission and patient started on aggressive IV fluid hydration.  Etiology likely multifactorial due to hypovolemia with recent nausea/vomiting/diarrhea and possible urinary obstruction.  Patient's creatinine improved from 7.43 to 1.13 at time of discharge.  Foley catheter was removed without any recurrence of urinary retention.  Continue tamsulosin 0.4 mg p.o. daily.  Recommend repeat BMP 1 week to ensure renal function remained stable.   Symptomatic anemia Hemoglobin 9 g/dL but previously had been 11.8 g/dL on 5/16 and had been 10.9 g/dL on 04/10/2021.  Patient did not report any bleeding, but was noted to have low blood pressures.  FOBT negative.  Seen by gastroenterology, no endoscopic evaluation recommended at this time.  Patient was transfused 1 unit PRBCs during hospitalization.  Hemoglobin remained stable and was 9.5 at time of discharge.  May resume Plavix. Outpatient  follow-up with gastroenterology, Dr. Fuller Plan as scheduled on 07/25/2021 at 9:10 AM.   Acute metabolic encephalopathy: Resolved myoclonic jerking: Resolved Patient was  noted to be altered from baseline with reports of jerking of the upper and lower extremities.  The level was noted to be within normal limits patient had reportedly stopped drinking alcohol 2 weeks ago unclear if symptoms are secondary to hyponatremia, kidney failure with uremia, and/or medication.  Review of office records note that gabapentin was increased to 600 mg up to 3 times daily due to severe back pain.  Question of possibility of gabapentin toxicity as a possible cause of some of patient's symptoms.  Neurology was consulted and followed during the hospital course.  EEG notable for diffuse encephalopathy, nonspecific, no seizures or epileptiform discharges noted.  Patient noted to have episodes of bilateral lower extremity and upper extremity asynchronous twitching without calm, EEG change, most likely not epileptic.  MR brain with no acute intracranial abnormality.  Patient now back to his typical baseline mentation.  Gabapentin was decreased to 300 mg p.o. 3 times daily.   Hyperkalemia Potassium 6.0 on admission, likely secondary to acute renal failure.  Treated with IV fluid hydration, calcium gluconate and Lokelma with resolution.  Potassium 4.8 at time of discharge.  Recommend BMP 1 week.   Hyponatremia: Resolved Sodium 129 admission, likely secondary to hypovolemic hyponatremia in the setting of dehydration.  Patient was hydrated with IV fluids with resolution and sodium 139 at time of discharge.  BMP 1 week.   B12 deficiency --Vitamin B12 1000 mcg PO daily   Hyperlipidemia: Atorvastatin 80 mg p.o. daily   Essential hypertension Continue home amlodipine 10 mg p.o. daily, furosemide 20 mg p.o. daily, lisinopril 20 mg p.o. daily, carvedilol 12.5 mg p.o. twice daily   COPD Home regimen includes Spiriva.  Not oxygen dependent at baseline.   Peripheral neuropathy Chronic back/neck pain On gabapentin 600 mg p.o. TID at home; reduced down to 300 mg p.o. 3 times daily on discharge.    CAD Patient with complex/complicated cardiac history noted to have RCA with robust collaterals from LAD and 95% LAD stenosis status post PCI/arthrectomy on 04/27/2020.  Continue home aspirin, Plavix, and statin.   Chronic pain Patient has a chronic history of back pain.  Home medication regimen includes oxycodone 10 mg every 6 hours as needed for pain.  Gabapentin reduced as above.   Polymyalgia rheumatica Continue prednisone '8mg'$  PO daily   History of BPH Tamsulosin 0.4 mg p.o. daily   History of esophageal stricture, hiatal hernia, and GERD Protonix 40 mg p.o. daily  Discharge Diagnoses:  Principal Problem:   ARF (acute renal failure) (HCC) Active Problems:   Symptomatic anemia   Acute metabolic encephalopathy   Myoclonic jerking   Acute respiratory failure with hypoxia (HCC)   Hyperkalemia   Hyponatremia   Gastroesophageal reflux disease   History of benign prostatic hyperplasia   Essential hypertension   Chronic pain syndrome   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HCC)   Esophageal stricture    Discharge Instructions  Discharge Instructions     Call MD for:  difficulty breathing, headache or visual disturbances   Complete by: As directed    Call MD for:  extreme fatigue   Complete by: As directed    Call MD for:  persistant dizziness or light-headedness   Complete by: As directed    Call MD for:  persistant nausea and vomiting   Complete by: As directed    Call MD for:  severe uncontrolled pain   Complete by: As directed    Call MD for:  temperature >100.4   Complete by: As directed    Diet - low sodium heart healthy   Complete by: As directed    Increase activity slowly   Complete by: As directed    No wound care   Complete by: As directed       Allergies as of 06/29/2021       Reactions   Celebrex [celecoxib] Hives, Itching   Tape    Tears skin. Prefers paper tape Please        Medication List     TAKE these medications    acetaminophen 500 MG  tablet Commonly known as: TYLENOL Take 1,000 mg by mouth every 8 (eight) hours as needed for mild pain.   albuterol 108 (90 Base) MCG/ACT inhaler Commonly known as: VENTOLIN HFA Inhale 2 puffs into the lungs every 6 (six) hours as needed for wheezing or shortness of breath.   alendronate 70 MG tablet Commonly known as: Fosamax Take 1 tablet (70 mg total) by mouth once a week. Take with a full glass of water on an empty stomach. What changed: when to take this   allopurinol 100 MG tablet Commonly known as: ZYLOPRIM TAKE 2 TABLETS(200 MG) BY MOUTH DAILY What changed: See the new instructions.   amLODipine 10 MG tablet Commonly known as: NORVASC TAKE 1 TABLET(10 MG) BY MOUTH DAILY What changed: See the new instructions.   aspirin EC 81 MG tablet Take 81 mg by mouth daily. Swallow whole.   atorvastatin 80 MG tablet Commonly known as: LIPITOR Take 1 tablet (80 mg total) by mouth daily.   carvedilol 12.5 MG tablet Commonly known as: Coreg Take 1 tablet (12.5 mg total) by mouth 2 (two) times daily.   Cholecalciferol 25 MCG (1000 UT) tablet Take 1,000 Units by mouth daily.   citalopram 20 MG tablet Commonly known as: CELEXA TAKE 1 TABLET(20 MG) BY MOUTH DAILY What changed: See the new instructions.   clopidogrel 75 MG tablet Commonly known as: PLAVIX Take 75 mg by mouth daily.   ezetimibe 10 MG tablet Commonly known as: Zetia Take 1 tablet (10 mg total) by mouth daily.   fluticasone 50 MCG/ACT nasal spray Commonly known as: FLONASE SHAKE LIQUID AND USE 2 SPRAYS IN EACH NOSTRIL DAILY AS NEEDED FOR RHINITIS OR ALLERGIES What changed: See the new instructions.   furosemide 20 MG tablet Commonly known as: LASIX Take 20 mg by mouth daily.   gabapentin 600 MG tablet Commonly known as: Neurontin Take 0.5 tablets (300 mg total) by mouth 2 (two) times daily. X 1 week, then 600 mg TID- for nerve/back pain What changed: how much to take   hydrOXYzine 25 MG  capsule Commonly known as: VISTARIL Take 1 capsule (25 mg total) by mouth every 8 (eight) hours as needed. What changed: reasons to take this   ipratropium 0.03 % nasal spray Commonly known as: ATROVENT Place 2 sprays into both nostrils every 12 (twelve) hours. What changed:  when to take this reasons to take this   lisinopril 20 MG tablet Commonly known as: ZESTRIL Take 20 mg by mouth daily.   ondansetron 4 MG disintegrating tablet Commonly known as: ZOFRAN-ODT Take 1 tablet (4 mg total) by mouth every 8 (eight) hours as needed for nausea or vomiting.   Oxycodone HCl 10 MG Tabs Take 1 tablet (10 mg total) by mouth every 6 (six) hours as needed. What  changed: reasons to take this   pantoprazole 40 MG tablet Commonly known as: PROTONIX Take 1 tablet (40 mg total) by mouth daily. Start taking on: Jun 30, 2021   potassium chloride 10 MEQ tablet Commonly known as: KLOR-CON Take 2 tablets (20 mEq total) by mouth 2 (two) times daily.   predniSONE 5 MG tablet Commonly known as: DELTASONE Take 1 tablet (5 mg total) by mouth daily with breakfast.   predniSONE 1 MG tablet Commonly known as: DELTASONE TAKE 3 TABLETS BY MOUTH DAILY ALONG WITH 5 MG FOR TOTAL OF 8 MG DAILY FOR 2 MONTHS THEN REDUCE TO 7 MG BY MOUTH DAILY   Spiriva Respimat 2.5 MCG/ACT Aers Generic drug: Tiotropium Bromide Monohydrate Inhale 2 puffs into the lungs daily. What changed:  when to take this reasons to take this   tamsulosin 0.4 MG Caps capsule Commonly known as: FLOMAX Take 0.4 mg by mouth in the morning and at bedtime.   thiamine 100 MG tablet Take 1 tablet (100 mg total) by mouth daily. Start taking on: Jun 30, 2021   traMADol 50 MG tablet Commonly known as: Ultram Take 2 tablets (100 mg total) by mouth 2 (two) times daily. What changed:  when to take this reasons to take this   vitamin B-12 1000 MCG tablet Commonly known as: CYANOCOBALAMIN Take 1 tablet (1,000 mcg total) by mouth  daily.               Durable Medical Equipment  (From admission, onward)           Start     Ordered   06/28/21 1425  For home use only DME 3 n 1  Once        06/28/21 1424            Follow-up Information     Ladene Artist, MD Follow up on 07/25/2021.   Specialty: Gastroenterology Why: at 9:10 am. Please arrive 10 minutes early Contact information: [address] [address] [phone]         [facility] Follow up.   Why: home health Contact information: [address] [address] [phone]         Haydee Salter, MD. Schedule an appointment as soon as possible for a visit in 1 week(s).   Specialty: Family Medicine Contact information: [address] [place] [address] [phone]                Allergies  Allergen Reactions   Celebrex [Celecoxib] Hives and Itching   Tape     Tears skin. Prefers paper tape Please    Consultations: [facility]   Procedures/Studies: DG Chest 2 View  Result Date: 06/28/2021 CLINICAL DATA:  Tremors, acute renal failure. EXAM: CHEST - 2 VIEW COMPARISON:  06/26/2021 FINDINGS: Chronic left ventricular prominence. Chronic aortic atherosclerotic calcification. Previous TAVR. The pulmonary vascularity is normal. The lungs are clear. No visible effusion. No significant bone finding. IMPRESSION: No acute process.  Resolution of basilar atelectasis. Electronically Signed   By: Nelson Chimes M.D.   On: 06/28/2021 08:07   CT Head Wo Contrast  Result Date: 06/26/2021 CLINICAL DATA:  Mental status change, unknown cause is EXAM: CT HEAD WITHOUT CONTRAST TECHNIQUE: Contiguous axial images were obtained from the base of the skull through the vertex without intravenous contrast. RADIATION DOSE REDUCTION: This exam was performed according to the departmental dose-optimization program which includes automated exposure control, adjustment  of the mA  and/or kV according to patient size and/or use of iterative reconstruction technique. COMPARISON:  None Available. FINDINGS: Brain: No acute intracranial hemorrhage. No focal mass lesion. No CT evidence of acute infarction. No midline shift or mass effect. No hydrocephalus. Basilar cisterns are patent. There are periventricular and subcortical white matter hypodensities. Generalized cortical atrophy. Vascular: No hyperdense vessel or unexpected calcification. Skull: Normal. Negative for fracture or focal lesion. Sinuses/Orbits: Paranasal sinuses and mastoid air cells are clear. Orbits are clear. Other: None. IMPRESSION: No acute intracranial findings. Electronically Signed   By: Suzy Bouchard M.D.   On: 06/26/2021 09:33   MR BRAIN WO CONTRAST  Result Date: 06/28/2021 CLINICAL DATA:  Mental status change, unknown cause. EXAM: MRI HEAD WITHOUT CONTRAST TECHNIQUE: Multiplanar, multiecho pulse sequences of the brain and surrounding structures were obtained without intravenous contrast. COMPARISON:  Head CT Jun 26, 2021. FINDINGS: Brain: No acute infarction, hemorrhage, hydrocephalus, extra-axial collection or mass lesion. Scattered and confluent T2 hyperintensity within the white matter of the cerebral hemispheres and within the pons, nonspecific, most likely related to chronic microangiopathy. Moderate parenchymal volume loss. Punctate focus of susceptibility artifact in the medial left parietal lobe, suggestive of tiny hemosiderin deposit. Vascular: Normal flow voids. Skull and upper cervical spine: Normal marrow signal. Sinuses/Orbits: Small amount of secretion within the right maxillary sinus. Bilateral lens surgery. Other: None. IMPRESSION: 1. No acute intracranial abnormality. 2. Moderate chronic microvascular ischemic change of white matter and parenchymal volume loss. Electronically Signed   By: Pedro Earls M.D.   On: 06/28/2021 15:13   US RENAL  Result Date: 06/26/2021 CLINICAL  DATA:  Acute renal dysfunction EXAM: RENAL / URINARY TRACT ULTRASOUND COMPLETE COMPARISON:  CT done on 03/24/2021 and abdominal sonogram done on 03/17/2020 FINDINGS: Right Kidney: Renal measurements: 12.7 x 6.5 x 5.2 cm = volume: 226.1 mL. Echogenicity within normal limits. No mass or hydronephrosis visualized. Left Kidney: Renal measurements: 12.9 x 6.4 x 5.2 cm = volume: 224.8 mL. Echogenicity within normal limits. No mass or hydronephrosis visualized. Bladder: Appears normal for degree of bladder distention. Other: None. IMPRESSION: No sonographic abnormality is seen in the kidneys. There is no hydronephrosis. Electronically Signed   By: Elmer Picker M.D.   On: 06/26/2021 13:27   DG Chest Port 1 View  Result Date: 06/26/2021 CLINICAL DATA:  Weakness. EXAM: PORTABLE CHEST 1 VIEW COMPARISON:  03/03/2021 FINDINGS: 0933 hours. Low volume film. Streaky opacity in the lung bases suggest atelectasis. Cardiopericardial silhouette is at upper limits of normal for size. No pulmonary edema or focal airspace consolidation. No substantial pleural effusion. Telemetry leads overlie the chest. IMPRESSION: Low volume film with basilar atelectasis. Electronically Signed   By: Misty Stanley M.D.   On: 06/26/2021 09:46   EEG adult  Result Date: 06/27/2021 Lora Havens, MD     06/28/2021  9:36 AM Patient Name: Brandon [name] MRN: [id number] Epilepsy Attending: Lora Havens Referring Physician/Provider: August Albino, NP Date: 06/27/2021 Duration: 24.33 mins Patient history: 81 y.o. male with past medical history of HTN, HLD, CHF, BPH, anxiety/depression, TIA, CAD who presents to the [facility] Ed for evaluation of jerking all over his body and confusion. EEG to evaluate for seizure Level of alertness: Awake, asleep AEDs during EEG study: None Technical aspects: This EEG study was done with scalp electrodes positioned according to the 10-20 International system of electrode placement. Electrical activity was acquired at a  sampling rate of '500Hz'$  and reviewed with a high  frequency filter of '70Hz'$  and a low frequency filter of '1Hz'$ . EEG data were recorded continuously and digitally stored. Description: The posterior dominant rhythm consists of '8Hz'$  activity of moderate voltage (25-35 uV) seen predominantly in posterior head regions, symmetric and reactive to eye opening and eye closing. Sleep was characterized by vertex waves, sleep spindles (12 to 14 Hz), maximal frontocentral region. EEG showed intermittent generalized  3 to 6 Hz theta-delta slowing. Patient was noted to have episodes of bilateral lower extremity and upper extremity asynchronous twitching.  Concomitant EEG before, during and after the event did not show any EEG changes suggest seizure. Hyperventilation and photic stimulation were not performed.   ABNORMALITY - Intermittent slow, generalized IMPRESSION: This study is suggestive of mild diffuse encephalopathy, nonspecific etiology. No seizures or epileptiform discharges were seen throughout the recording. Patient was noted to have episodes of bilateral lower extremity and upper extremity asynchronous twitching without concomitant EEG change. These episodes were most likely NOT epileptic. Priyanka Barbra Sarks     Subjective: Patient seen examined bedside, resting comfortably.  Lying in bed.  Feels well and asking when he will be discharging home today.  Foley catheter removed yesterday and no recurrence of urinary retention.  Asking to take shower.  No other specific complaints or concerns at this time.  Denies headache, no dizziness, no chest pain, palpitations, no visual changes, no nausea/vomiting/diarrhea, no fever/chills/night sweats, no abdominal pain, no focal weakness, no fatigue, no paresthesias.  No acute events overnight per nursing staff.  Discharge Exam: Vitals:   06/29/21 0431 06/29/21 0741  BP: (!) 162/88 (!) 151/75  Pulse: 60 70  Resp: 18 18  Temp: 98 F (36.7 C) 98 F (36.7 C)  SpO2: 100% 99%    Vitals:   06/28/21 1537 06/28/21 2007 06/29/21 0431 06/29/21 0741  BP: (!) 157/70 (!) 163/78 (!) 162/88 (!) 151/75  Pulse: 94 80 60 70  Resp: '17 18 18 18  '$ Temp: 98.5 F (36.9 C) 98.6 F (37 C) 98 F (36.7 C) 98 F (36.7 C)  TempSrc: Oral Oral Oral   SpO2: 96% 98% 100% 99%  Weight:      Height:        Physical Exam: GEN: NAD, alert and oriented x 3, wd/wn HEENT: NCAT, PERRL, EOMI, sclera clear, MMM PULM: CTAB w/o wheezes/crackles, normal respiratory effort, on room air CV: RRR w/o M/G/R GI: abd soft, NTND, NABS, no R/G/M MSK: no peripheral edema, muscle strength globally intact 5/5 bilateral upper/lower extremities NEURO: CN II-XII intact, no focal deficits, sensation to light touch intact PSYCH: normal mood/affect Integumentary: dry/intact, no rashes or wounds    The results of significant diagnostics from this hospitalization (including imaging, microbiology, ancillary and laboratory) are listed below for reference.     Microbiology: No results found for this or any previous visit (from the past 240 hour(s)).   Labs: BNP (last 3 results) Recent Labs    11/25/20 0919 12/26/20 1145  BNP 226.0* 102.5*   Basic Metabolic Panel: Recent Labs  Lab 06/26/21 0904 06/26/21 1922 06/27/21 0335 06/27/21 1958 06/28/21 0215 06/29/21 0707  NA 129* 129* 130* 136 138 139  K 6.0* 5.5* 5.7* 5.6* 4.8 4.8  CL 98 100 101 108 109 107  CO2 20* 19* 21* 22 20* 25  GLUCOSE 94 97 87 89 89 113*  BUN 86* 81* 73* 56* 50* 34*  CREATININE 7.43* 5.62* 3.92* 1.81* 1.55* 1.13  CALCIUM 8.8* 8.3* 8.8* 8.7* 9.0 8.8*  MG 2.0  --   --   --   --   --  PHOS 6.6*  --   --   --   --   --    Liver Function Tests: Recent Labs  Lab 06/26/21 0904  AST 13*  ALT 13  ALKPHOS 103  BILITOT 0.6  PROT 6.0*  ALBUMIN 3.2*   No results for input(s): LIPASE, AMYLASE in the last 168 hours. Recent Labs  Lab 06/26/21 0925  AMMONIA 10   CBC: Recent Labs  Lab 06/26/21 0904 06/26/21 1922  06/27/21 0335 06/28/21 0215  WBC 6.5  --  5.2 3.9*  NEUTROABS 4.7  --   --  2.9  HGB 9.0* 7.8* 9.5* 10.1*  HCT 29.5* 25.9* 30.5* 32.4*  MCV 81.7  --  80.9 80.4  PLT 125*  --  101* 104*   Cardiac Enzymes: Recent Labs  Lab 06/26/21 0904  CKTOTAL 138   BNP: Invalid input(s): POCBNP CBG: No results for input(s): GLUCAP in the last 168 hours. D-Dimer No results for input(s): DDIMER in the last 72 hours. Hgb A1c No results for input(s): HGBA1C in the last 72 hours. Lipid Profile No results for input(s): CHOL, HDL, LDLCALC, TRIG, CHOLHDL, LDLDIRECT in the last 72 hours. Thyroid function studies Recent Labs    06/28/21 0215  TSH 0.730   Anemia work up Recent Labs    06/28/21 0215  VITAMINB12 >7,500*  FOLATE 15.6  FERRITIN 174  TIBC 260  IRON 13*  RETICCTPCT 1.2   Urinalysis    Component Value Date/Time   COLORURINE YELLOW 06/26/2021 0904   APPEARANCEUR CLEAR 06/26/2021 0904   LABSPEC 1.011 06/26/2021 0904   PHURINE 5.0 06/26/2021 0904   GLUCOSEU NEGATIVE 06/26/2021 0904   GLUCOSEU NEGATIVE 09/29/2019 1125   [place] 06/26/2021 0904   BILIRUBINUR NEGATIVE 06/26/2021 0904   BILIRUBINUR negative 08/01/2016 1419   KETONESUR NEGATIVE 06/26/2021 0904   PROTEINUR NEGATIVE 06/26/2021 0904   UROBILINOGEN 0.2 09/29/2019 1125   NITRITE NEGATIVE 06/26/2021 0904   LEUKOCYTESUR NEGATIVE 06/26/2021 0904   Sepsis Labs Invalid input(s): PROCALCITONIN,  WBC,  LACTICIDVEN Microbiology No results found for this or any previous visit (from the past 240 hour(s)).   Time coordinating discharge: Over 30 minutes  SIGNED:   Jasiyah Paulding J [place] DO  [facility] 06/29/2021, 9:38 AM

## 2021-06-30 ENCOUNTER — Other Ambulatory Visit: Payer: Self-pay

## 2021-06-30 LAB — TYPE AND SCREEN
ABO/RH(D): O POS
Antibody Screen: NEGATIVE
Unit division: 0
Unit division: 0

## 2021-06-30 LAB — BPAM RBC
Blood Product Expiration Date: 202305282359
Blood Product Expiration Date: 202306192359
ISSUE DATE / TIME: 202305222233
Unit Type and Rh: 5100
Unit Type and Rh: 5100

## 2021-06-30 LAB — VITAMIN E
Vitamin E (Alpha Tocopherol): 9.5 mg/L (ref 9.0–29.0)
Vitamin E(Gamma Tocopherol): 0.9 mg/L (ref 0.5–4.9)

## 2021-06-30 MED ORDER — OXYCODONE HCL 10 MG PO TABS: 10.0000 mg | ORAL_TABLET | Freq: Four times a day (QID) | ORAL | 0 refills | Status: DC | PRN

## 2021-06-30 NOTE — Telephone Encounter (Signed)
Filled  Written  ID  Drug  QTY  Days  Prescriber  RX #  Dispenser  Refill  Daily Dose*  Pymt Type  PMP  06/02/2021 06/02/2021 1  Tramadol Hcl 50 Mg Tablet 120.00 30 Me Lov [id number] [facility] ([id number]) 0/5 20.00 MME Medicare [place] 05/30/2021 05/30/2021 1  Oxycodone Hcl (Ir) 10 Mg Tab 120.00 30 Me Lov [id number] [facility] ([id number]) 0/0 60.00 MME Medicare [place]

## 2021-07-05 NOTE — Progress Notes (Signed)
HEART AND [place]                                     Cardiology Office Note:    Date:  07/13/2021   ID:  Brandon [name], DOB [birth year], MRN [id number]  PCP:  Haydee Salter, MD  [facility] Cardiologist:  Loralie Champagne, MD / Dr. Burt Knack & Dr. Cyndia Bent (TAVR)  [facility] Electrophysiologist:  None   Referring MD: Haydee Salter, MD   1 year s/p TAVR  History of Present Illness:    Brandon [name] is a 81 y.o. 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   The patient has been followed for aortic stenosis with surveillance echo studies and close clinical care by Dr. Aundra Dubin.  He has developed progressive and now severe aortic stenosis with mean gradient of 50 mmHg and calculated valve area of 0.96 cm.  LVEF remains preserved at 60 to 65%.  He underwent cardiac catheterization for preoperative assessment and was demonstrated to have chronic occlusion of the right coronary artery with left-to-right collaterals, severe calcific proximal LAD stenosis, and moderately severe long segment mid circumflex stenosis. The patient reported exertional chest pain and dyspnea. He was referred to the multidisciplinary valve team for consideration of SAVR+CABG or TAVR+PCI. Cardiac gated CTA of the heart revealed anatomical characteristics consistent with aortic stenosis suitable for treatment by transcatheter aortic valve replacement without any significant complicating features. CTA of the aorta and iliac vessels demonstrated a large  infrarenal abdominal aortic aneurysm which is increasing in size, currently measuring 5.0x 4.2 cm in diameter (previously 4.3 cm on 02/26/2017). Seen by Dr. Donzetta Matters with vascular who recommended repeat CT scan in 5-6 months and proceed with TAVR. He felt he may be a candidate for endovascular repair in the future.       He was evaluated by the multidisciplinary valve team inlcuding Dr. Burt Knack and Dr Cyndia Bent who felt PCI+TAVR via the subclavian approach was the most appropriate course of action for the patient. He underwent successful orbital atherectomy and stenting of severe calcific stenosis in the proximal LAD, procedure guided by OCT imaging, treated with a 4.0 x 12 mm resolute Onyx DES on 04/27/20. There was moderate left circumflex stenosis with negative pressure wire analysis. He was continued on uninterrupted DAPT with aspirin and Plavix x 6 months.     He underwent successful TAVR with a 29 mm Edwards Sapien 3 THV via the subclavian approach on 06/07/20. Post operative echo showed EF 60%, normally functioning TAVR with a mean gradient of 8 mmHg and no PVL. He was continued on DAPT with aspirin and plavix. 1 month echo showed EF 70%, normally functioning TAVR with a mean gradient of 16 mm hg and mild PVL. Gradient has increased since last echo (up for 8 mm hg with an estimated EF of 60-65%). Repeat echo 3 months later 11/07/20 showed stable mean gradient of 16 mm hg.    She was admitted 11/2020 for a  displaced left femoral neck fracture secondary to mechanical fall and underwent total hip replacement. Repeat echo showed stable gradients.    He underwent endovascular abdominal aortic aneurysm repair with Dr. Donzetta Matters on 03/03/21. He notes that it was discovered afterwards that he did not have good flow through the left iliac. He had a repeat catheterization on 3/6 with endovascular aneurysm repair of the left iliac artery.   He was admitted 5/22-5/25/23 for acute metabolic encephalopathy with myclonic jerking.  He also had AKI with a creat peak of 7.43. this improved to 1.13 at dischaged. Additionally he had acute on chronic anemia with a hg down to 9 and he was transfused 1U PRBC.   Seen by PCP on 07/07/21 and repeat labs were stable.   Today he presents to clinic for follow up. He has had a rough year but doing a lot better now. Has a lot of ecchymosis from Plavix. Mostly limited by back pain. Recent spinal injection helped his pain. No CP. Does have an occasional GI upset after taking med on empty stomach. Tums help it.  He has chronic SOB that is stable. No LE edema, orthopnea or PND. No dizziness or syncope. No blood in stool or urine. No palpitations.    Past Medical History:  Diagnosis Date   Abdominal aneurysm ([place])    Arthritis    "all over" (07/19/2016)   BPH (benign prostatic hypertrophy)    CAD (coronary artery disease)    Chicken pox    Chronic lower back pain    s/p surgical fusion   Depression    Diverticulosis    Esophageal stricture    GERD (gastroesophageal reflux disease)    Hepatitis B 1984   Hiatal hernia    History of radiation therapy 01/16/18- 03/05/18   Left Tonsil, 66 Gy in 33 fractions to high risk nodal echelons.    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 Burt Knack and Dr Cyndia Bent    Severe aortic stenosis    TIA (transient ischemic attack) 1990s   hx   tonsillar ca dx'd 11/2017   Tubular adenoma of colon 2009    Past Surgical History:  Procedure Laterality Date   ABDOMINAL AORTIC ENDOVASCULAR STENT GRAFT N/A 03/03/2021   Procedure: ABDOMINAL AORTIC ENDOVASCULAR STENT GRAFT;  Surgeon: Waynetta Sandy, MD;  Location: [place];  Service: Vascular;  Laterality: N/A;   ABDOMINAL AORTOGRAM W/LOWER EXTREMITY N/A 04/10/2021   Procedure: ABDOMINAL AORTOGRAM W/LOWER EXTREMITY;   Surgeon: Waynetta Sandy, MD;  Location: [place] [facility];  Service: Cardiovascular;  Laterality: N/A;   BACK SURGERY     CARDIAC CATHETERIZATION     CATARACT EXTRACTION W/ INTRAOCULAR LENS  IMPLANT, BILATERAL Bilateral 01/2012 - 02/2012   CORONARY ATHERECTOMY N/A 04/27/2020   Procedure: CORONARY ATHERECTOMY;  Surgeon: Sherren Mocha, MD;  Location: [place] [facility];  Service: Cardiovascular;  Laterality: N/A;   CORONARY STENT INTERVENTION N/A 04/27/2020   Procedure: CORONARY STENT INTERVENTION;  Surgeon: Sherren Mocha, MD;  Location: [place] [facility];  Service: Cardiovascular;  Laterality: N/A;   DIRECT LARYNGOSCOPY Left 10/09/2017   Procedure: DIRECT LARYNGOSCOPY WITH BOPSY;  Surgeon: Jodi Marble, MD;  Location: [place];  Service: ENT;  Laterality: Left;   ESOPHAGOGASTRODUODENOSCOPY (EGD) WITH ESOPHAGEAL DILATION     "  couple times" (07/19/2016)   ESOPHAGOSCOPY Left 10/09/2017   Procedure: ESOPHAGOSCOPY;  Surgeon: Jodi Marble, MD;  Location: [place];  Service: ENT;  Laterality: Left;   INTRAVASCULAR IMAGING/OCT N/A 04/27/2020   Procedure: INTRAVASCULAR IMAGING/OCT;  Surgeon: Sherren Mocha, MD;  Location: [place] [facility];  Service: Cardiovascular;  Laterality: N/A;   INTRAVASCULAR PRESSURE WIRE/FFR STUDY N/A 04/27/2020   Procedure: INTRAVASCULAR PRESSURE WIRE/FFR STUDY;  Surgeon: Sherren Mocha, MD;  Location: [place] [facility];  Service: Cardiovascular;  Laterality: N/A;   IR GASTROSTOMY TUBE MOD SED  01/08/2018   IR THORACENTESIS ASP PLEURAL SPACE W/IMG GUIDE  07/19/2016   LAPAROSCOPIC CHOLECYSTECTOMY  1984   LUMBAR [place] SURGERY  05/1996   L4-5; Dr. Claudean Kinds LAMINECTOMY/DECOMPRESSION MICRODISCECTOMY  10/2002   L3-4. Dr. Sherwood Gambler   MULTIPLE TOOTH EXTRACTIONS  1980s   PARTIAL GLOSSECTOMY  12/02/2017   Dr. Nicolette Bang- [facility]   pharyngoplasty for closure of tingue base defect  12/02/2017   Dr. Nicolette Bang- [place]  07/15/2016   POSTERIOR LUMBAR FUSION  09/1996   Ray cage, L4-5 Dr. Rita Ohara   PROSTATE BIOPSY  ~ 2017   radical tonsillectomy Left 12/02/2017   Dr. Nicolette Bang at [place] N/A 03/31/2020   Procedure: RIGHT HEART CATH AND CORONARY ANGIOGRAPHY;  Surgeon: Larey Dresser, MD;  Location: [place] [facility];  Service: Cardiovascular;  Laterality: N/A;   RIGID BRONCHOSCOPY Left 10/09/2017   Procedure: RIGID BRONCHOSCOPY;  Surgeon: Jodi Marble, MD;  Location: [place];  Service: ENT;  Laterality: Left;   TEE WITHOUT CARDIOVERSION N/A 06/07/2020   Procedure: TRANSESOPHAGEAL ECHOCARDIOGRAM (TEE);  Surgeon: Sherren Mocha, MD;  Location: [place];  Service: Open Heart Surgery;  Laterality: N/A;   TONSILLECTOMY     TOTAL HIP ARTHROPLASTY Left 12/04/2020   Procedure: TOTAL HIP ARTHROPLASTY ANTERIOR APPROACH;  Surgeon: Rod Can, MD;  Location: [facility];  Service: Orthopedics;  Laterality: Left;   TOTAL HIP ARTHROPLASTY Left 11/2020   TRACHEOSTOMY  12/02/2017   Dr. Nicolette Bang- [facility]   ULTRASOUND GUIDANCE FOR VASCULAR ACCESS Right 06/07/2020   Procedure: ULTRASOUND GUIDANCE FOR VASCULAR ACCESS;  Surgeon: Sherren Mocha, MD;  Location: [place];  Service: Open Heart Surgery;  Laterality: Right;   ULTRASOUND GUIDANCE FOR VASCULAR ACCESS Bilateral 03/03/2021   Procedure: ULTRASOUND GUIDANCE FOR VASCULAR ACCESS, BILATERAL FEMORAL ARTERIES;  Surgeon: Waynetta Sandy, MD;  Location: [place];  Service: Vascular;  Laterality: Bilateral;   VASCULAR SURGERY      Current Medications: Current Meds  Medication Sig   acetaminophen (TYLENOL) 500 MG tablet Take 1,000 mg by mouth every 8 (eight) hours as needed for mild pain.   albuterol (VENTOLIN HFA) 108 (90 Base) MCG/ACT inhaler Inhale 2 puffs into the lungs every 6 (six) hours as needed for wheezing or shortness of breath.   alendronate (FOSAMAX) 70 MG tablet Take 1 tablet (70 mg total)  by mouth once a week. Take with a full glass of water on an empty stomach. (Patient taking differently: Take 70 mg by mouth every Tuesday. Take with a full glass of water on an empty stomach.)   allopurinol (ZYLOPRIM) 100 MG tablet TAKE 2 TABLETS(200 MG) BY MOUTH DAILY   amLODipine (NORVASC) 10 MG tablet TAKE 1 TABLET(10 MG) BY MOUTH DAILY (Patient taking differently: Take 10 mg by mouth daily.)   aspirin EC 81 MG tablet Take 81 mg by mouth daily. Swallow whole.  atorvastatin (LIPITOR) 80 MG tablet Take 1 tablet (80 mg total) by mouth daily.   carvedilol (COREG) 12.5 MG tablet Take 1 tablet (12.5 mg total) by mouth 2 (two) times daily.   Cholecalciferol 25 MCG (1000 UT) tablet Take 1,000 Units by mouth daily.   citalopram (CELEXA) 20 MG tablet TAKE 1 TABLET(20 MG) BY MOUTH DAILY (Patient taking differently: Take 20 mg by mouth daily.)   ezetimibe (ZETIA) 10 MG tablet Take 1 tablet (10 mg total) by mouth daily.   fluticasone (FLONASE) 50 MCG/ACT nasal spray SHAKE LIQUID AND USE 2 SPRAYS IN EACH NOSTRIL DAILY AS NEEDED FOR RHINITIS OR ALLERGIES (Patient taking differently: Place 2 sprays into both nostrils daily as needed for rhinitis or allergies.)   furosemide (LASIX) 20 MG tablet Take 20 mg by mouth daily.   gabapentin (NEURONTIN) 300 MG capsule Take 300 mg by mouth 3 (three) times daily.   ipratropium (ATROVENT) 0.03 % nasal spray Place 2 sprays into both nostrils every 12 (twelve) hours. (Patient taking differently: Place 2 sprays into both nostrils 2 (two) times daily as needed for rhinitis (allergies).)   lisinopril (ZESTRIL) 20 MG tablet Take 20 mg by mouth daily.   ondansetron (ZOFRAN-ODT) 4 MG disintegrating tablet Take 1 tablet (4 mg total) by mouth every 8 (eight) hours as needed for nausea or vomiting.   Oxycodone HCl 10 MG TABS Take 1 tablet (10 mg total) by mouth every 6 (six) hours as needed.   pantoprazole (PROTONIX) 40 MG tablet Take 1 tablet (40 mg total) by mouth daily.    potassium chloride (KLOR-CON) 10 MEQ tablet Take 2 tablets (20 mEq total) by mouth 2 (two) times daily.   predniSONE (DELTASONE) 1 MG tablet TAKE 3 TABLETS BY MOUTH DAILY ALONG WITH 5 MG FOR TOTAL OF 8 MG DAILY FOR 2 MONTHS THEN REDUCE TO 7 MG BY MOUTH DAILY (Patient taking differently: Take 4 mg by mouth daily with breakfast. TAKE 3 TABLETS BY MOUTH DAILY ALONG WITH 5 MG FOR TOTAL OF 8 MG DAILY FOR 2 MONTHS THEN REDUCE TO 7 MG BY MOUTH DAILY)   predniSONE (DELTASONE) 5 MG tablet Take 1 tablet (5 mg total) by mouth daily with breakfast.   tamsulosin (FLOMAX) 0.4 MG CAPS capsule Take 0.4 mg by mouth in the morning and at bedtime.   thiamine 100 MG tablet Take 1 tablet (100 mg total) by mouth daily.   Tiotropium Bromide Monohydrate (SPIRIVA RESPIMAT) 2.5 MCG/ACT AERS Inhale 2 puffs into the lungs daily. (Patient taking differently: Inhale 2 puffs into the lungs daily as needed (shortness of breath).)   traMADol (ULTRAM) 50 MG tablet Take 2 tablets (100 mg total) by mouth 2 (two) times daily. (Patient taking differently: Take 100 mg by mouth every 12 (twelve) hours as needed for moderate pain.)   vitamin B-12 (CYANOCOBALAMIN) 1000 MCG tablet Take 1 tablet (1,000 mcg total) by mouth daily.   [DISCONTINUED] clopidogrel (PLAVIX) 75 MG tablet Take 75 mg by mouth daily.     Allergies:   Celebrex [celecoxib] and Tape   Social History   Socioeconomic History   Marital status: Married    Spouse name: etta   Number of children: 2   Years of education: Not on file   Highest education level: Not on file  Occupational History   Occupation: retired    Fish farm manager: RETIRED    Comment: disabled due to back problems  Tobacco Use   Smoking status: Former    Packs/day: 1.50  Years: 50.00    Total pack years: 75.00    Types: Cigarettes    Quit date: 09/05/2017    Years since quitting: 3.8   Smokeless tobacco: Never   Tobacco comments:    smoked less than 1 ppd for 40+ years;   Vaping Use   Vaping Use:  Never used  Substance and Sexual Activity   Alcohol use: Not Currently   Drug use: Yes    Types: Oxycodone    Comment: has prescription   Sexual activity: Not Currently  Other Topics Concern   Not on file  Social History Narrative   ** Merged History Encounter **       Married (3rd), [place]. 2 children from 1st marriage, 4 step children.    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       Cell # [phone]   Social Determinants of Health   Financial Resource Strain: Low Risk  (06/07/2021)   Overall Financial Resource Strain (CARDIA)    Difficulty of Paying Living Expenses: Not hard at all  Food Insecurity: No Food Insecurity (06/07/2021)   Hunger Vital Sign    Worried About Running Out of Food in the Last Year: Never true    [place] in the Last Year: Never true  Transportation Needs: No Transportation Needs (06/07/2021)   PRAPARE - Hydrologist (Medical): No    Lack of Transportation (Non-Medical): No  Physical Activity: Inactive (06/07/2021)   Exercise Vital Sign    Days of Exercise per Week: 0 days    Minutes of Exercise per Session: 0 min  Stress: No Stress Concern Present (06/07/2021)   [place]    Feeling of Stress : Not at all  Social Connections: Moderately Integrated (06/07/2021)   Social Connection and Isolation Panel [NHANES]    Frequency of Communication with Friends and Family: Twice a week    Frequency of Social Gatherings with Friends and Family: Twice a week    Attends Religious Services: More than 4 times per year    Active Member of [employer] or Organizations: No    Attends Archivist Meetings: Never    Marital Status: Married     Family History: The patient's family history includes Alzheimer's disease (age of onset: 67) in his mother; Aneurysm in his brother; Arthritis in  his mother; Brain cancer in his maternal aunt; Coronary artery disease in his father; Heart disease (age of onset: 79) in his father; Obesity in his daughter; Stomach cancer in his maternal uncle.  ROS:   Please see the history of present illness.    All other systems reviewed and are negative.  EKGs/Labs/Other Studies Reviewed:    The following studies were reviewed today:  TAVR OPERATIVE NOTE     Date of Procedure:                06/07/2020   Preoperative Diagnosis:      Severe Aortic Stenosis   Postoperative Diagnosis:    Same   Procedure:        Transcatheter Aortic Valve Replacement - Left Subclavian Artery Approach             Edwards Sapien 3 THV (size 29 mm, model # 9600TFX, serial # [id number])              Co-Surgeons:  Gaye Pollack, MD and Sherren Mocha, MD   Anesthesiologist:                  Roberts Gaudy, MD   Echocardiographer:              Jenkins Rouge, MD   Pre-operative Echo Findings: Severe aortic stenosis, Mild aortic insufficiency Normal left ventricular systolic function   Post-operative Echo Findings: Trivial paravalvular leak Normal left ventricular systolic function   _____________   Echo 06/08/20: IMPRESSIONS   1. Left ventricular ejection fraction, by estimation, is 60 to 65%. The  left ventricle has normal function. The left ventricle has no regional  wall motion abnormalities. There is severe asymmetric left ventricular  hypertrophy of the basal and septal segments. Left ventricular diastolic parameters were normal.   2. Right ventricular systolic function is normal. The right ventricular  size is normal.   3. Left atrial size was moderately dilated.   4. The mitral valve is abnormal. Mild mitral valve regurgitation. No  evidence of mitral stenosis.   5. Post TAVR with 29 mm Sapien 3 valve no significant PVL mean gradient 8 peak 17 mmHg AVA 5.6 cm2 DVI 0.91 . The aortic valve is normal in  structure. Aortic valve  regurgitation is not visualized. No aortic  stenosis is present.   6. Aortic dilatation noted. There is mild dilatation of the ascending  aorta, measuring 41 mm.   7. The inferior vena cava is normal in size with greater than 50%  respiratory variability, suggesting right atrial pressure of 3 mmHg.   _________________________   Echo 08/10/20 IMPRESSIONS   1. 29 mm S3 in the aortic position. Vmax 2.6 m/s, MG 16 mmHG, EOA 2.90 cm2, DI 0.51. There is mild paravalvular leak in the 5-7 o'clock position. Gradient has increased since last echo. Higher gradient could be related to hyperdynamic LV function, but would consider gated CTA to exclude HALT. The aortic valve has been repaired/replaced. Aortic valve regurgitation is mild. There is a 29 mm Sapien prosthetic (TAVR) valve present in the aortic position. Procedure Date: 06/07/2020.   2. Hyperdynamic LV with intracavitary gradient noted up to 16 mmHG. Left  ventricular ejection fraction, by estimation, is 70 to 75%. The left  ventricle has hyperdynamic function. The left ventricle has no regional  wall motion abnormalities. There is moderate concentric left ventricular hypertrophy. Left ventricular diastolic parameters are consistent with Grade II diastolic dysfunction (pseudonormalization). Elevated left ventricular end-diastolic pressure.  The E/e' is 20.7. The average left ventricular global longitudinal strain is -20.2 %. The global longitudinal strain is normal.   3. Right ventricular systolic function is normal. The right ventricular  size is normal. Tricuspid regurgitation signal is inadequate for assessing  PA pressure.   4. Left atrial size was moderately dilated.   5. The mitral valve is grossly normal. No evidence of mitral valve  regurgitation. No evidence of mitral stenosis.   6. Aortic dilatation noted. There is mild dilatation of the aortic root  and of the ascending aorta, measuring 40 mm.   7. The inferior vena cava is normal in  size with greater than 50%  respiratory variability, suggesting right atrial pressure of 3 mmHg.   Comparison(s): Changes from prior study are noted. Hyperdynamic LV  function. Gradients across TAVR have increased as described above.    ____________________   Echo 06/23/21 IMPRESSIONS  1. Left ventricular ejection fraction, by estimation, is 65 to 70%. The left ventricle has normal function. The  left ventricle has no regional wall motion abnormalities. Left ventricular diastolic parameters are consistent with Grade I diastolic  dysfunction (impaired relaxation).  2. Right ventricular systolic function is normal. The right ventricular size is normal.  3. Left atrial size was moderately dilated.  4. The mitral valve is normal in structure. No evidence of mitral valve regurgitation. No evidence of mitral stenosis.  5. Mild TAVR perivalvular leak, 29 mm Edwards Sapien 3 THV via the subclavian approach on 06/07/20. The aortic valve has been repaired/replaced. Aortic valve regurgitation is not visualized. No aortic stenosis is present. There is a 29 mm Sapien  prosthetic (TAVR) valve present in the aortic position. Procedure Date: 06/07/20. Echo findings are consistent with perivalvular leak of the aortic prosthesis. Aortic valve mean gradient measures 8.0 mmHg. Aortic valve Vmax measures 2.11 m/s.  6. Aortic dilatation noted. There is mild dilatation of the ascending aorta, measuring 40 mm.  7. The inferior vena cava is normal in size with greater than 50% respiratory variability, suggesting right atrial pressure of 3 mmHg.  EKG:  EKG is NOT ordered today.    Recent Labs: 12/26/2020: B Natriuretic Peptide 103.8 06/26/2021: ALT 13; Magnesium 2.0 06/28/2021: Hemoglobin 10.1; Platelets 104; TSH 0.730 07/07/2021: BUN 33; Creatinine, Ser 0.98; Potassium 5.1; Sodium 136  Recent Lipid Panel    Component Value Date/Time   CHOL 156 07/26/2020 1130   TRIG 61 07/26/2020 1130   HDL 67 07/26/2020 1130    CHOLHDL 2.3 07/26/2020 1130   VLDL 12 07/26/2020 1130   LDLCALC 77 07/26/2020 1130   LDLDIRECT 81.2 05/25/2008 0906     Risk Assessment/Calculations:       Physical Exam:    VS:  BP 114/60   Pulse 71   Ht 5' 6.5" (1.689 m)   Wt 164 lb (74.4 kg)   SpO2 99%   BMI 26.07 kg/m     Wt Readings from Last 3 Encounters:  07/13/21 164 lb (74.4 kg)  07/07/21 168 lb 12.8 oz (76.6 kg)  07/06/21 169 lb (76.7 kg)     GEN:  Well nourished, well developed in no acute distress HEENT: Normal NECK: No JVD LYMPHATICS: No lymphadenopathy CARDIAC: RRR, no murmurs, rubs, gallops RESPIRATORY:  expiratory wheeze at bases, otherwise clear ABDOMEN: Soft, non-tender, non-distended MUSCULOSKELETAL:  No edema; No deformity  SKIN: Warm and dry NEUROLOGIC:  Alert and oriented x 3 PSYCHIATRIC:  Normal affect   ASSESSMENT:    1. S/P TAVR (transcatheter aortic valve replacement)   2. CAD in native artery   3. Infrarenal abdominal aortic aneurysm (AAA) without rupture ([place])   4. Essential hypertension    PLAN:    In order of problems listed above:   Severe AS s/p TAVR: echo today shows EF 65%, normally functioning TAVR with a mean gradient of 8 mm hg and mild PVL. Patient is doing quite well with NYHA class II symptoms; has dyspnea related to lung disease. Continue aspirin 81 mg daily. Will stop plavix now.  SBE prophylaxis discussed; the patient is edentulous and does not go to the dentist. Continue regular follow up with Dr. Aundra Dubin    CAD with angina: he underwent successful orbital atherectomy and stenting of severe calcific stenosis in the proximal LAD, procedure guided by OCT imaging, treated with a 4.0 x 12 mm resolute Onyx DES on 04/27/20. There was moderate left circumflex stenosis with negative pressure wire analysis. He has been on DAPT with aspirin and plavix. He would like to stop taking plavix now,  which I think is okay. Continue statin, BB and nitrates. No chest pain.     AAA: he  underwent endovascular abdominal aortic aneurysm repair with Dr. Donzetta Matters on 03/03/21. He notes that it was discovered afterwards that he did not have good flow through the left iliac. He had a repeat catheterization on 3/6 with endovascular aneurysm repair of the left iliac artery. I spoke with Dr. Donzetta Matters who was okay with him coming off his plavix.     HTN: BP well controlled. No changes made.     Medication Adjustments/Labs and Tests Ordered: Current medicines are reviewed at length with the patient today.  Concerns regarding medicines are outlined above.  No orders of the defined types were placed in this encounter.  No orders of the defined types were placed in this encounter.   There are no Patient Instructions on file for this visit.   Signed, Angelena Form, PA-C  07/13/2021 3:05 PM    [facility]

## 2021-07-06 ENCOUNTER — Ambulatory Visit: Payer: Medicare Other | Admitting: Rheumatology

## 2021-07-06 ENCOUNTER — Encounter: Payer: Self-pay | Admitting: Rheumatology

## 2021-07-06 VITALS — BP 111/62 | HR 80 | Ht 66.5 in | Wt 169.0 lb

## 2021-07-06 DIAGNOSIS — K750 Abscess of liver: Secondary | ICD-10-CM

## 2021-07-06 DIAGNOSIS — M19042 Primary osteoarthritis, left hand: Secondary | ICD-10-CM

## 2021-07-06 DIAGNOSIS — G894 Chronic pain syndrome: Secondary | ICD-10-CM

## 2021-07-06 DIAGNOSIS — F419 Anxiety disorder, unspecified: Secondary | ICD-10-CM

## 2021-07-06 DIAGNOSIS — Z8639 Personal history of other endocrine, nutritional and metabolic disease: Secondary | ICD-10-CM

## 2021-07-06 DIAGNOSIS — Z9289 Personal history of other medical treatment: Secondary | ICD-10-CM

## 2021-07-06 DIAGNOSIS — C090 Malignant neoplasm of tonsillar fossa: Secondary | ICD-10-CM

## 2021-07-06 DIAGNOSIS — M19041 Primary osteoarthritis, right hand: Secondary | ICD-10-CM

## 2021-07-06 DIAGNOSIS — Z79899 Other long term (current) drug therapy: Secondary | ICD-10-CM

## 2021-07-06 DIAGNOSIS — M8589 Other specified disorders of bone density and structure, multiple sites: Secondary | ICD-10-CM

## 2021-07-06 DIAGNOSIS — M5416 Radiculopathy, lumbar region: Secondary | ICD-10-CM | POA: Diagnosis not present

## 2021-07-06 DIAGNOSIS — M5136 Other intervertebral disc degeneration, lumbar region: Secondary | ICD-10-CM

## 2021-07-06 DIAGNOSIS — F32A Depression, unspecified: Secondary | ICD-10-CM

## 2021-07-06 DIAGNOSIS — Z7952 Long term (current) use of systemic steroids: Secondary | ICD-10-CM | POA: Diagnosis not present

## 2021-07-06 DIAGNOSIS — I7143 Infrarenal abdominal aortic aneurysm, without rupture: Secondary | ICD-10-CM

## 2021-07-06 DIAGNOSIS — M353 Polymyalgia rheumatica: Secondary | ICD-10-CM

## 2021-07-06 DIAGNOSIS — I10 Essential (primary) hypertension: Secondary | ICD-10-CM

## 2021-07-06 DIAGNOSIS — M1A0210 Idiopathic chronic gout, right elbow, without tophus (tophi): Secondary | ICD-10-CM

## 2021-07-06 DIAGNOSIS — E559 Vitamin D deficiency, unspecified: Secondary | ICD-10-CM

## 2021-07-06 DIAGNOSIS — Z96642 Presence of left artificial hip joint: Secondary | ICD-10-CM

## 2021-07-06 DIAGNOSIS — Z952 Presence of prosthetic heart valve: Secondary | ICD-10-CM

## 2021-07-06 DIAGNOSIS — Z8719 Personal history of other diseases of the digestive system: Secondary | ICD-10-CM

## 2021-07-06 DIAGNOSIS — R1312 Dysphagia, oropharyngeal phase: Secondary | ICD-10-CM

## 2021-07-06 DIAGNOSIS — L409 Psoriasis, unspecified: Secondary | ICD-10-CM

## 2021-07-06 DIAGNOSIS — Z87891 Personal history of nicotine dependence: Secondary | ICD-10-CM

## 2021-07-07 ENCOUNTER — Encounter: Payer: Self-pay | Admitting: Family Medicine

## 2021-07-07 ENCOUNTER — Ambulatory Visit (INDEPENDENT_AMBULATORY_CARE_PROVIDER_SITE_OTHER): Payer: Medicare Other | Admitting: Family Medicine

## 2021-07-07 VITALS — BP 128/78 | HR 69 | Temp 97.4°F | Wt 168.8 lb

## 2021-07-07 DIAGNOSIS — J449 Chronic obstructive pulmonary disease, unspecified: Secondary | ICD-10-CM

## 2021-07-07 DIAGNOSIS — N179 Acute kidney failure, unspecified: Secondary | ICD-10-CM | POA: Diagnosis not present

## 2021-07-07 DIAGNOSIS — I10 Essential (primary) hypertension: Secondary | ICD-10-CM | POA: Diagnosis not present

## 2021-07-07 DIAGNOSIS — I5032 Chronic diastolic (congestive) heart failure: Secondary | ICD-10-CM

## 2021-07-07 LAB — BASIC METABOLIC PANEL
BUN: 33 mg/dL — ABNORMAL HIGH (ref 6–23)
CO2: 26 mEq/L (ref 19–32)
Calcium: 9.5 mg/dL (ref 8.4–10.5)
Chloride: 102 mEq/L (ref 96–112)
Creatinine, Ser: 0.98 mg/dL (ref 0.40–1.50)
GFR: 73.73 mL/min (ref >60.00)
Glucose, Bld: 106 mg/dL — ABNORMAL HIGH (ref 70–99)
Potassium: 5.1 mEq/L (ref 3.5–5.1)
Sodium: 136 mEq/L (ref 135–145)

## 2021-07-07 NOTE — Progress Notes (Signed)
Brandon [name] Dept: [phone] Dept Fax: [phone]  Office Visit  Subjective:    Patient ID: Brandon [name], male    DOB: [birth year], 81 y.o..   MRN: [id number]  Chief Complaint  Patient presents with   Hospitalization Follow-up    Patient was in [facility] for acute renal failure 06/26/21. Today he states he is feeling great and he states no concerns.     History of Present Illness:  Patient is in today for follow-up from a recent hospitalization. Brandon [name] was admitted at [facility] on 5/22-5/25/2023. I had seen him a week prior with upper abdominal pain and N/V/D. His lab workup was not impressive. At presentation to [facility], he was in acute renal failure secondary to dehydration. He was acutely anemic, but no source of blood loss was found. He did get transfused 1 unit of PRBCs. He also had myoclonic jerking that was felt to be due to an acute metabolic encephalopathy.It was unclear as to the etiology, though they considered alcohol withdrawal vs. gabapentin toxicity.  Since returning home, Brandon [name] notes that he feels much better, in fact the best he has felt in quite some time. He notes he is eating and drinking fluids well. He is not having any mental clouding. He admits to some dyspnea associated with his COPD and says he did not use his albuterol or Spiriva today. He saw rheumatology yesterday to follow-up on his PMR. They are continuing to taper his prednisone 1 mg each month.  Past Medical History: Patient Active Problem List   Diagnosis Date Noted   ARF (acute renal failure) ([place]) 06/26/2021   Hyponatremia 06/26/2021   Hyperkalemia 93/81/8299   Acute metabolic encephalopathy 37/16/9678   Myoclonic jerking 06/26/2021   Lumbar postlaminectomy syndrome 05/03/2021   Degeneration of lumbar intervertebral disc 05/03/2021   Lumbar spondylosis 05/03/2021   History of hip fracture 04/24/2021   S/P  abdominal aortic aneurysm repair 04/24/2021   Chronic allergic rhinitis 01/24/2021   Demand ischemia ([place])    Acute respiratory failure with hypoxia (HCC)    Elevated troponin    Chronic diastolic heart failure ([place]) 12/03/2020   Steatosis of liver 11/01/2020   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   Hepatitis B core antibody positive 11/01/2020   Benign prostatic hyperplasia 11/01/2020   Chronic obstructive pulmonary disease ([place]) 07/20/2020   History of placement of stent in LAD coronary artery 07/20/2020   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   TIA (transient ischemic attack)    Esophageal stricture    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[place]) 04/19/2020   Primary osteoarthritis of both hands 01/11/2020   Piriformis syndrome of both sides 09/23/2019   Pain of both shoulder joints 07/29/2019   Sciatica associated with disorder of lumbar spine 04/24/2019   Primary osteoarthritis involving multiple joints 04/24/2019   Chronic pain syndrome 04/24/2019   Anxiety and depression 04/15/2018   Cancer of tonsillar fossa ([place]) 09/20/2017   Liver abscess 07/10/2016   Symptomatic anemia 07/10/2016   Essential hypertension 05/19/2013   Osteoarthritis 05/29/2009   Psoriasis 01/13/2009   Coronary artery disease 09/15/2008   Diverticulosis of colon 08/27/2007   History of benign prostatic hyperplasia 08/27/2007   Hyperlipidemia 03/19/2007   Gastroesophageal reflux disease 03/19/2007   Past Surgical History:  Procedure Laterality Date   ABDOMINAL AORTIC ENDOVASCULAR STENT GRAFT N/A 03/03/2021   Procedure: ABDOMINAL AORTIC ENDOVASCULAR STENT GRAFT;  Surgeon: Donzetta Matters,  Georgia Dom, MD;  Location: [place];  Service: Vascular;  Laterality: N/A;   ABDOMINAL AORTOGRAM W/LOWER EXTREMITY N/A 04/10/2021   Procedure: ABDOMINAL AORTOGRAM W/LOWER EXTREMITY;  Surgeon: Waynetta Sandy, MD;  Location: [place] [facility];  Service:  Cardiovascular;  Laterality: N/A;   BACK SURGERY     CARDIAC CATHETERIZATION     CATARACT EXTRACTION W/ INTRAOCULAR LENS  IMPLANT, BILATERAL Bilateral 01/2012 - 02/2012   CORONARY ATHERECTOMY N/A 04/27/2020   Procedure: CORONARY ATHERECTOMY;  Surgeon: Sherren Mocha, MD;  Location: [place] [facility];  Service: Cardiovascular;  Laterality: N/A;   CORONARY STENT INTERVENTION N/A 04/27/2020   Procedure: CORONARY STENT INTERVENTION;  Surgeon: Sherren Mocha, MD;  Location: [place] [facility];  Service: Cardiovascular;  Laterality: N/A;   DIRECT LARYNGOSCOPY Left 10/09/2017   Procedure: DIRECT LARYNGOSCOPY WITH BOPSY;  Surgeon: Jodi Marble, MD;  Location: [place];  Service: ENT;  Laterality: Left;   ESOPHAGOGASTRODUODENOSCOPY (EGD) WITH ESOPHAGEAL DILATION     "couple times" (07/19/2016)   ESOPHAGOSCOPY Left 10/09/2017   Procedure: ESOPHAGOSCOPY;  Surgeon: Jodi Marble, MD;  Location: [place];  Service: ENT;  Laterality: Left;   INTRAVASCULAR IMAGING/OCT N/A 04/27/2020   Procedure: INTRAVASCULAR IMAGING/OCT;  Surgeon: Sherren Mocha, MD;  Location: [place] [facility];  Service: Cardiovascular;  Laterality: N/A;   INTRAVASCULAR PRESSURE WIRE/FFR STUDY N/A 04/27/2020   Procedure: INTRAVASCULAR PRESSURE WIRE/FFR STUDY;  Surgeon: Sherren Mocha, MD;  Location: [place] [facility];  Service: Cardiovascular;  Laterality: N/A;   IR GASTROSTOMY TUBE MOD SED  01/08/2018   IR THORACENTESIS ASP PLEURAL SPACE W/IMG GUIDE  07/19/2016   LAPAROSCOPIC CHOLECYSTECTOMY  1984   LUMBAR [place] SURGERY  05/1996   L4-5; Dr. Claudean Kinds LAMINECTOMY/DECOMPRESSION MICRODISCECTOMY  10/2002   L3-4. Dr. [place] Gambler   MULTIPLE TOOTH EXTRACTIONS  1980s   PARTIAL GLOSSECTOMY  12/02/2017   Dr. Nicolette Bang- [facility]   pharyngoplasty for closure of tingue base defect  12/02/2017   Dr. Nicolette Bang- [place]  07/15/2016   POSTERIOR LUMBAR FUSION  09/1996   Ray cage, L4-5  Dr. Rita Ohara   PROSTATE BIOPSY  ~ 2017   radical tonsillectomy Left 12/02/2017   Dr. Nicolette Bang at [place] N/A 03/31/2020   Procedure: RIGHT HEART CATH AND CORONARY ANGIOGRAPHY;  Surgeon: Larey Dresser, MD;  Location: [place] [facility];  Service: Cardiovascular;  Laterality: N/A;   RIGID BRONCHOSCOPY Left 10/09/2017   Procedure: RIGID BRONCHOSCOPY;  Surgeon: Jodi Marble, MD;  Location: [place];  Service: ENT;  Laterality: Left;   TEE WITHOUT CARDIOVERSION N/A 06/07/2020   Procedure: TRANSESOPHAGEAL ECHOCARDIOGRAM (TEE);  Surgeon: Sherren Mocha, MD;  Location: [place];  Service: Open Heart Surgery;  Laterality: N/A;   TONSILLECTOMY     TOTAL HIP ARTHROPLASTY Left 12/04/2020   Procedure: TOTAL HIP ARTHROPLASTY ANTERIOR APPROACH;  Surgeon: Rod Can, MD;  Location: [facility];  Service: Orthopedics;  Laterality: Left;   TOTAL HIP ARTHROPLASTY Left 11/2020   TRACHEOSTOMY  12/02/2017   Dr. [facility]icolette [facility]- [facility]   ULTRASOUND GUIDANCE FOR VASCULAR ACCESS Right 06/07/2020   Procedure: ULTRASOUND GUIDANCE FOR VASCULAR ACCESS;  Surgeon: Sherren Mocha, MD;  Location: [place];  Service: Open Heart Surgery;  Laterality: Right;   ULTRASOUND GUIDANCE FOR VASCULAR ACCESS Bilateral 03/03/2021   Procedure: ULTRASOUND GUIDANCE FOR VASCULAR ACCESS, BILATERAL FEMORAL ARTERIES;  Surgeon: Waynetta Sandy, MD;  Location: [place];  Service:  Vascular;  Laterality: Bilateral;   VASCULAR SURGERY     Family History  Problem Relation Age of Onset   Heart disease Father 39       Living   Coronary artery disease Father        CABG   Alzheimer's disease Mother 68       Deceased   Arthritis Mother    Aneurysm Brother    Stomach cancer Maternal Uncle    Brain cancer Maternal Aunt        x2   Obesity Daughter        Had Bypass Sx   Outpatient Medications Prior to Visit  Medication Sig Dispense Refill   acetaminophen (TYLENOL) 500 MG tablet Take  1,000 mg by mouth every 8 (eight) hours as needed for mild pain.     albuterol (VENTOLIN HFA) 108 (90 Base) MCG/ACT inhaler Inhale 2 puffs into the lungs every 6 (six) hours as needed for wheezing or shortness of breath. 8 g 2   alendronate (FOSAMAX) 70 MG tablet Take 1 tablet (70 mg total) by mouth once a week. Take with a full glass of water on an empty stomach. (Patient taking differently: Take 70 mg by mouth every Tuesday. Take with a full glass of water on an empty stomach.) 12 tablet 0   allopurinol (ZYLOPRIM) 100 MG tablet TAKE 2 TABLETS(200 MG) BY MOUTH DAILY (Patient taking differently: Take 200 mg by mouth daily.) 180 tablet 0   amLODipine (NORVASC) 10 MG tablet TAKE 1 TABLET(10 MG) BY MOUTH DAILY (Patient taking differently: Take 10 mg by mouth daily.) 90 tablet 3   aspirin EC 81 MG tablet Take 81 mg by mouth daily. Swallow whole.     atorvastatin (LIPITOR) 80 MG tablet Take 1 tablet (80 mg total) by mouth daily. 90 tablet 3   carvedilol (COREG) 12.5 MG tablet Take 1 tablet (12.5 mg total) by mouth 2 (two) times daily. 60 tablet 6   Cholecalciferol 25 MCG (1000 UT) tablet Take 1,000 Units by mouth daily.     citalopram (CELEXA) 20 MG tablet TAKE 1 TABLET(20 MG) BY MOUTH DAILY (Patient taking differently: Take 20 mg by mouth daily.) 30 tablet 5   clopidogrel (PLAVIX) 75 MG tablet Take 75 mg by mouth daily.     ezetimibe (ZETIA) 10 MG tablet Take 1 tablet (10 mg total) by mouth daily. 30 tablet 5   fluticasone (FLONASE) 50 MCG/ACT nasal spray SHAKE LIQUID AND USE 2 SPRAYS IN EACH NOSTRIL DAILY AS NEEDED FOR RHINITIS OR ALLERGIES (Patient taking differently: Place 2 sprays into both nostrils daily as needed for rhinitis or allergies.) 16 g 11   furosemide (LASIX) 20 MG tablet Take 20 mg by mouth daily.     gabapentin (NEURONTIN) 300 MG capsule Take 300 mg by mouth 3 (three) times daily.     hydrOXYzine (VISTARIL) 25 MG capsule Take 1 capsule (25 mg total) by mouth every 8 (eight) hours as  needed. (Patient taking differently: Take 25 mg by mouth every 8 (eight) hours as needed for anxiety or itching.) 30 capsule 0   ipratropium (ATROVENT) 0.03 % nasal spray Place 2 sprays into both nostrils every 12 (twelve) hours. (Patient taking differently: Place 2 sprays into both nostrils 2 (two) times daily as needed for rhinitis (allergies).) 30 mL 12   lisinopril (ZESTRIL) 20 MG tablet Take 20 mg by mouth daily.     ondansetron (ZOFRAN-ODT) 4 MG disintegrating tablet Take 1 tablet (4 mg total) by mouth  every 8 (eight) hours as needed for nausea or vomiting. 20 tablet 0   Oxycodone HCl 10 MG TABS Take 1 tablet (10 mg total) by mouth every 6 (six) hours as needed. 120 tablet 0   pantoprazole (PROTONIX) 40 MG tablet Take 1 tablet (40 mg total) by mouth daily. 30 tablet 2   potassium chloride (KLOR-CON) 10 MEQ tablet Take 2 tablets (20 mEq total) by mouth 2 (two) times daily. 120 tablet 11   predniSONE (DELTASONE) 1 MG tablet TAKE 3 TABLETS BY MOUTH DAILY ALONG WITH 5 MG FOR TOTAL OF 8 MG DAILY FOR 2 MONTHS THEN REDUCE TO 7 MG BY MOUTH DAILY (Patient taking differently: Take 4 mg by mouth daily with breakfast. TAKE 3 TABLETS BY MOUTH DAILY ALONG WITH 5 MG FOR TOTAL OF 8 MG DAILY FOR 2 MONTHS THEN REDUCE TO 7 MG BY MOUTH DAILY) 180 tablet 0   predniSONE (DELTASONE) 5 MG tablet Take 1 tablet (5 mg total) by mouth daily with breakfast. 90 tablet 0   tamsulosin (FLOMAX) 0.4 MG CAPS capsule Take 0.4 mg by mouth in the morning and at bedtime.     thiamine 100 MG tablet Take 1 tablet (100 mg total) by mouth daily. 30 tablet 2   Tiotropium Bromide Monohydrate (SPIRIVA RESPIMAT) 2.5 MCG/ACT AERS Inhale 2 puffs into the lungs daily. (Patient taking differently: Inhale 2 puffs into the lungs daily as needed (shortness of breath).) 4 g 6   traMADol (ULTRAM) 50 MG tablet Take 2 tablets (100 mg total) by mouth 2 (two) times daily. (Patient taking differently: Take 100 mg by mouth every 12 (twelve) hours as needed  for moderate pain.) 120 tablet 5   vitamin B-12 (CYANOCOBALAMIN) 1000 MCG tablet Take 1 tablet (1,000 mcg total) by mouth daily. 30 tablet 2   gabapentin (NEURONTIN) 600 MG tablet Take 0.5 tablets (300 mg total) by mouth 2 (two) times daily. X 1 week, then 600 mg TID- for nerve/back pain (Patient taking differently: Take 600 mg by mouth 2 (two) times daily. X 1 week, then 600 mg TID- for nerve/back pain) 90 tablet 5   No facility-administered medications prior to visit.   Allergies  Allergen Reactions   Celebrex [Celecoxib] Hives and Itching   Tape     Tears skin. Prefers paper tape Please    Objective:   Today's Vitals   07/07/21 1117  BP: 128/78  Pulse: 69  Temp: (!) 97.4 F (36.3 C)  TempSrc: Temporal  SpO2: 97%  Weight: 168 lb 12.8 oz (76.6 kg)   Body mass index is 26.84 kg/m.   General: Well developed, well nourished. No acute distress. Lungs: Bilateral inspiratory/expiratory wheezing with mild bibasilar rales. Air movement appears good. No   tachypnea. CV: RRR without murmurs or rubs. Pulses 2+ bilaterally. Extremities: No edema noted. Psych: Alert and oriented. Normal mood and affect.  Health Maintenance Due  Topic Date Due   Zoster Vaccines- Shingrix (1 of 2) Never done   COVID-19 Vaccine (4 - Booster for Pfizer series) 01/11/2020   COLONOSCOPY (Pts 45-56yr Insurance coverage will need to be confirmed)  03/22/2020     Assessment & Plan:   1. Acute renal failure, unspecified acute renal failure type ([facility] We will reassess his renal function and electrolytes today. Clinically, he is much improved. His weight is up several pounds, likely reflective of his improved intake.  - Basic metabolic panel  2. Chronic diastolic heart failure (HCC) Compensated. Continue carvedilol 12.5 mg bid, Lasix 20 mg  bid, and lisinopril 20 mg daily.  3. Essential hypertension Blood pressure at goal. Continue amlodipine 10 mg daily.  4. Chronic obstructive pulmonary disease,  unspecified COPD type ([place]) I recommend he use his Spiriva daily and his albuterol daily as needed.  Return for As scheduled.   Haydee Salter, MD

## 2021-07-12 ENCOUNTER — Other Ambulatory Visit: Payer: Self-pay | Admitting: Physician Assistant

## 2021-07-12 NOTE — Telephone Encounter (Signed)
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 daily  Labs: 06/28/2021 WBC 3.9, RBC 4.03, Hgb 10.1, Hct 32.4, MCH 25.1, RDW 16.8, Platelets 104, Lymphs Abs 0.5, CO2 20, BUN 50, Creat. 1.55, GFR 46  Okay to refill Allopurinol?

## 2021-07-13 ENCOUNTER — Ambulatory Visit (INDEPENDENT_AMBULATORY_CARE_PROVIDER_SITE_OTHER): Payer: Medicare Other | Admitting: Physician Assistant

## 2021-07-13 ENCOUNTER — Encounter: Payer: Self-pay | Admitting: Physician Assistant

## 2021-07-13 ENCOUNTER — Ambulatory Visit (HOSPITAL_COMMUNITY): Payer: Medicare Other | Attending: Cardiology

## 2021-07-13 VITALS — BP 114/60 | HR 71 | Ht 66.5 in | Wt 164.0 lb

## 2021-07-13 DIAGNOSIS — Z952 Presence of prosthetic heart valve: Secondary | ICD-10-CM

## 2021-07-13 DIAGNOSIS — I7143 Infrarenal abdominal aortic aneurysm, without rupture: Secondary | ICD-10-CM

## 2021-07-13 DIAGNOSIS — I10 Essential (primary) hypertension: Secondary | ICD-10-CM | POA: Diagnosis not present

## 2021-07-13 DIAGNOSIS — I2510 Atherosclerotic heart disease of native coronary artery without angina pectoris: Secondary | ICD-10-CM

## 2021-07-13 LAB — ECHOCARDIOGRAM COMPLETE
AV Mean grad: 8 mmHg
AV Peak grad: 17.8 mmHg
Ao pk vel: 2.11 m/s
Area-P 1/2: 1.74 cm2
P 1/2 time: 546 msec
S' Lateral: 2.2 cm

## 2021-07-13 MED ORDER — PERFLUTREN LIPID MICROSPHERE
1.0000 mL | INTRAVENOUS | Status: AC | PRN
  Administered 2021-07-13: 2 mL via INTRAVENOUS

## 2021-07-13 NOTE — Patient Instructions (Signed)
Medication Instructions:  Stop Plavix   *If you need a refill on your cardiac medications before your next appointment, please call your pharmacy*   Lab Work: None ordered   If you have labs (blood work) drawn today and your tests are completely normal, you will receive your results only by: [facility] (if you have MyChart) OR A paper copy in the mail If you have any lab test that is abnormal or we need to change your treatment, we will call you to review the results.   Testing/Procedures: None ordered     Other Instructions  Important Information About Sugar

## 2021-07-18 ENCOUNTER — Encounter: Payer: Self-pay | Admitting: Family Medicine

## 2021-07-18 ENCOUNTER — Ambulatory Visit (INDEPENDENT_AMBULATORY_CARE_PROVIDER_SITE_OTHER): Payer: Medicare Other | Admitting: Family Medicine

## 2021-07-18 VITALS — BP 120/66 | HR 73 | Temp 97.0°F | Ht 66.5 in | Wt 166.0 lb

## 2021-07-18 DIAGNOSIS — J209 Acute bronchitis, unspecified: Secondary | ICD-10-CM | POA: Diagnosis not present

## 2021-07-18 DIAGNOSIS — J42 Unspecified chronic bronchitis: Secondary | ICD-10-CM | POA: Diagnosis not present

## 2021-07-18 MED ORDER — PREDNISONE 20 MG PO TABS: 40.0000 mg | ORAL_TABLET | Freq: Every day | ORAL | 0 refills | Status: DC

## 2021-07-18 MED ORDER — DOXYCYCLINE HYCLATE 100 MG PO TABS: 100.0000 mg | ORAL_TABLET | Freq: Two times a day (BID) | ORAL | 0 refills | Status: DC

## 2021-07-18 NOTE — Progress Notes (Signed)
[facility] [address] [place] [address] Dept: [phone] Dept Fax: [phone]  Office Visit  Subjective:    Patient ID: Brandon [name], male    DOB: Sep 10, 1940, 81 y.o..   MRN: [id number]  Chief Complaint  Patient presents with   Acute Visit    C/o having chest congestion/wheezing x 2 days.   No OTC meds taken.     History of Present Illness:  Patient is in today with a 2-day history of increased cough with sputum production and increased wheezing. Brandon [name] has a history of COPD. He is managed on albuterol and tiotropium. He also is on chronic prednisone due to PMR, currently at 8 mg daily. He also has some chronic allergic rhinitis. and has had prior tonsillar cancer, s/p surgical excision and radiation.  Past Medical History: Patient Active Problem List   Diagnosis Date Noted   ARF (acute renal failure) ([place]) 06/26/2021   Hyponatremia 06/26/2021   Hyperkalemia 76/81/1572   Acute metabolic encephalopathy 62/04/5595   Myoclonic jerking 06/26/2021   Lumbar postlaminectomy syndrome 05/03/2021   Degeneration of lumbar intervertebral disc 05/03/2021   Lumbar spondylosis 05/03/2021   History of hip fracture 04/24/2021   S/P abdominal aortic aneurysm repair 04/24/2021   Chronic allergic rhinitis 01/24/2021   Demand ischemia ([place])    Acute respiratory failure with hypoxia (HCC)    Elevated troponin    Chronic diastolic heart failure ([place]) 12/03/2020   Steatosis of liver 11/01/2020   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   Hepatitis B core antibody positive 11/01/2020   Benign prostatic hyperplasia 11/01/2020   Chronic obstructive pulmonary disease ([place]) 07/20/2020   History of placement of stent in LAD coronary artery 07/20/2020   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   TIA (transient ischemic attack)    Esophageal stricture    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   PMR (polymyalgia  rheumatica) ([place]) 04/19/2020   Primary osteoarthritis of both hands 01/11/2020   Piriformis syndrome of both sides 09/23/2019   Pain of both shoulder joints 07/29/2019   Sciatica associated with disorder of lumbar spine 04/24/2019   Primary osteoarthritis involving multiple joints 04/24/2019   Chronic pain syndrome 04/24/2019   Anxiety and depression 04/15/2018   Cancer of tonsillar fossa ([place]) 09/20/2017   Liver abscess 07/10/2016   Symptomatic anemia 07/10/2016   Essential hypertension 05/19/2013   Osteoarthritis 05/29/2009   Psoriasis 01/13/2009   Coronary artery disease 09/15/2008   Diverticulosis of colon 08/27/2007   History of benign prostatic hyperplasia 08/27/2007   Hyperlipidemia 03/19/2007   Gastroesophageal reflux disease 03/19/2007   Past Surgical History:  Procedure Laterality Date   ABDOMINAL AORTIC ENDOVASCULAR STENT GRAFT N/A 03/03/2021   Procedure: ABDOMINAL AORTIC ENDOVASCULAR STENT GRAFT;  Surgeon: Waynetta Sandy, MD;  Location: [facility];  Service: Vascular;  Laterality: N/A;   ABDOMINAL AORTOGRAM W/LOWER EXTREMITY N/A 04/10/2021   Procedure: ABDOMINAL AORTOGRAM W/LOWER EXTREMITY;  Surgeon: Waynetta Sandy, MD;  Location: [facility];  Service: Cardiovascular;  Laterality: N/A;   BACK SURGERY     CARDIAC CATHETERIZATION     CATARACT EXTRACTION W/ INTRAOCULAR LENS  IMPLANT, BILATERAL Bilateral 01/2012 - 02/2012   CORONARY ATHERECTOMY N/A 04/27/2020   Procedure: CORONARY ATHERECTOMY;  Surgeon: Sherren Mocha, MD;  Location: [facility];  Service: Cardiovascular;  Laterality: N/A;   CORONARY STENT INTERVENTION N/A 04/27/2020   Procedure: CORONARY STENT INTERVENTION;  Surgeon: Sherren Mocha, MD;  Location: [facility];  Service: Cardiovascular;  Laterality: N/A;   DIRECT LARYNGOSCOPY Left 10/09/2017   Procedure: DIRECT LARYNGOSCOPY WITH BOPSY;  Surgeon: Jodi Marble, MD;  Location: [facility];  Service: ENT;   Laterality: Left;   ESOPHAGOGASTRODUODENOSCOPY (EGD) WITH ESOPHAGEAL DILATION     "couple times" (07/19/2016)   ESOPHAGOSCOPY Left 10/09/2017   Procedure: ESOPHAGOSCOPY;  Surgeon: Jodi Marble, MD;  Location: [facility];  Service: ENT;  Laterality: Left;   INTRAVASCULAR IMAGING/OCT N/A 04/27/2020   Procedure: INTRAVASCULAR IMAGING/OCT;  Surgeon: Sherren Mocha, MD;  Location: [facility];  Service: Cardiovascular;  Laterality: N/A;   INTRAVASCULAR PRESSURE WIRE/FFR STUDY N/A 04/27/2020   Procedure: INTRAVASCULAR PRESSURE WIRE/FFR STUDY;  Surgeon: Sherren Mocha, MD;  Location: [facility];  Service: Cardiovascular;  Laterality: N/A;   IR GASTROSTOMY TUBE MOD SED  01/08/2018   IR THORACENTESIS ASP PLEURAL SPACE W/IMG GUIDE  07/19/2016   LAPAROSCOPIC CHOLECYSTECTOMY  1984   LUMBAR [place] SURGERY  05/1996   L4-5; Dr. Claudean Kinds LAMINECTOMY/DECOMPRESSION MICRODISCECTOMY  10/2002   L3-4. Dr. Sherwood Gambler   MULTIPLE TOOTH EXTRACTIONS  1980s   PARTIAL GLOSSECTOMY  12/02/2017   Dr. Nicolette Bang- [facility]   pharyngoplasty for closure of tingue base defect  12/02/2017   Dr. Nicolette Bang- [place]  07/15/2016   POSTERIOR LUMBAR FUSION  09/1996   Ray cage, L4-5 Dr. Rita Ohara   PROSTATE BIOPSY  ~ 2017   radical tonsillectomy Left 12/02/2017   Dr. Nicolette Bang at [facility] N/A 03/31/2020   Procedure: RIGHT HEART CATH AND CORONARY ANGIOGRAPHY;  Surgeon: Larey Dresser, MD;  Location: [facility];  Service: Cardiovascular;  Laterality: N/A;   RIGID BRONCHOSCOPY Left 10/09/2017   Procedure: RIGID BRONCHOSCOPY;  Surgeon: Jodi Marble, MD;  Location: [facility];  Service: ENT;  Laterality: Left;   TEE WITHOUT CARDIOVERSION N/A 06/07/2020   Procedure: TRANSESOPHAGEAL ECHOCARDIOGRAM (TEE);  Surgeon: Sherren Mocha, MD;  Location: [facility];  Service: Open Heart Surgery;  Laterality: N/A;   TONSILLECTOMY      TOTAL HIP ARTHROPLASTY Left 12/04/2020   Procedure: TOTAL HIP ARTHROPLASTY ANTERIOR APPROACH;  Surgeon: Rod Can, MD;  Location: [facility];  Service: Orthopedics;  Laterality: Left;   TOTAL HIP ARTHROPLASTY Left 11/2020   TRACHEOSTOMY  12/02/2017   Dr. Nicolette [facility]- [facility]   ULTRASOUND GUIDANCE FOR VASCULAR ACCESS Right 06/07/2020   Procedure: ULTRASOUND GUIDANCE FOR VASCULAR ACCESS;  Surgeon: Sherren Mocha, MD;  Location: [facility];  Service: Open Heart Surgery;  Laterality: Right;   ULTRASOUND GUIDANCE FOR VASCULAR ACCESS Bilateral 03/03/2021   Procedure: ULTRASOUND GUIDANCE FOR VASCULAR ACCESS, BILATERAL FEMORAL ARTERIES;  Surgeon: Waynetta Sandy, MD;  Location: [facility] OR;  Service: Vascular;  Laterality: Bilateral;   VASCULAR SURGERY     Family History  Problem Relation Age of Onset   Heart disease Father 74       Living   Coronary artery disease Father        CABG   Alzheimer's disease Mother 35       Deceased   Arthritis Mother    Aneurysm Brother    Stomach cancer Maternal Uncle    Brain cancer Maternal Aunt        x2   Obesity Daughter        Had Bypass Sx   Outpatient Medications Prior to Visit  Medication Sig Dispense Refill   acetaminophen (TYLENOL) 500 MG tablet Take  1,000 mg by mouth every 8 (eight) hours as needed for mild pain.     albuterol (VENTOLIN HFA) 108 (90 Base) MCG/ACT inhaler Inhale 2 puffs into the lungs every 6 (six) hours as needed for wheezing or shortness of breath. 8 g 2   alendronate (FOSAMAX) 70 MG tablet Take 1 tablet (70 mg total) by mouth once a week. Take with a full glass of water on an empty stomach. (Patient taking differently: Take 70 mg by mouth every Tuesday. Take with a full glass of water on an empty stomach.) 12 tablet 0   allopurinol (ZYLOPRIM) 100 MG tablet TAKE 2 TABLETS(200 MG) BY MOUTH DAILY 180 tablet 0   amLODipine (NORVASC) 10 MG tablet TAKE 1 TABLET(10 MG) BY MOUTH DAILY (Patient taking differently: Take 10 mg by mouth daily.)  90 tablet 3   aspirin EC 81 MG tablet Take 81 mg by mouth daily. Swallow whole.     atorvastatin (LIPITOR) 80 MG tablet Take 1 tablet (80 mg total) by mouth daily. 90 tablet 3   carvedilol (COREG) 12.5 MG tablet Take 1 tablet (12.5 mg total) by mouth 2 (two) times daily. 60 tablet 6   Cholecalciferol 25 MCG (1000 UT) tablet Take 1,000 Units by mouth daily.     citalopram (CELEXA) 20 MG tablet TAKE 1 TABLET(20 MG) BY MOUTH DAILY (Patient taking differently: Take 20 mg by mouth daily.) 30 tablet 5   ezetimibe (ZETIA) 10 MG tablet Take 1 tablet (10 mg total) by mouth daily. 30 tablet 5   fluticasone (FLONASE) 50 MCG/ACT nasal spray SHAKE LIQUID AND USE 2 SPRAYS IN EACH NOSTRIL DAILY AS NEEDED FOR RHINITIS OR ALLERGIES (Patient taking differently: Place 2 sprays into both nostrils daily as needed for rhinitis or allergies.) 16 g 11   furosemide (LASIX) 20 MG tablet Take 20 mg by mouth daily.     gabapentin (NEURONTIN) 300 MG capsule Take 300 mg by mouth 3 (three) times daily.     hydrOXYzine (VISTARIL) 25 MG capsule Take 1 capsule (25 mg total) by mouth every 8 (eight) hours as needed. 30 capsule 0   ipratropium (ATROVENT) 0.03 % nasal spray Place 2 sprays into both nostrils every 12 (twelve) hours. (Patient taking differently: Place 2 sprays into both nostrils 2 (two) times daily as needed for rhinitis (allergies).) 30 mL 12   lisinopril (ZESTRIL) 20 MG tablet Take 20 mg by mouth daily.     ondansetron (ZOFRAN-ODT) 4 MG disintegrating tablet Take 1 tablet (4 mg total) by mouth every 8 (eight) hours as needed for nausea or vomiting. 20 tablet 0   Oxycodone HCl 10 MG TABS Take 1 tablet (10 mg total) by mouth every 6 (six) hours as needed. 120 tablet 0   pantoprazole (PROTONIX) 40 MG tablet Take 1 tablet (40 mg total) by mouth daily. 30 tablet 2   potassium chloride (KLOR-CON) 10 MEQ tablet Take 2 tablets (20 mEq total) by mouth 2 (two) times daily. 120 tablet 11   predniSONE (DELTASONE) 1 MG tablet TAKE  3 TABLETS BY MOUTH DAILY ALONG WITH 5 MG FOR TOTAL OF 8 MG DAILY FOR 2 MONTHS THEN REDUCE TO 7 MG BY MOUTH DAILY (Patient taking differently: Take 4 mg by mouth daily with breakfast. TAKE 3 TABLETS BY MOUTH DAILY ALONG WITH 5 MG FOR TOTAL OF 8 MG DAILY FOR 2 MONTHS THEN REDUCE TO 7 MG BY MOUTH DAILY) 180 tablet 0   predniSONE (DELTASONE) 5 MG tablet Take 1 tablet (5 mg total)  by mouth daily with breakfast. 90 tablet 0   tamsulosin (FLOMAX) 0.4 MG CAPS capsule Take 0.4 mg by mouth in the morning and at bedtime.     thiamine 100 MG tablet Take 1 tablet (100 mg total) by mouth daily. 30 tablet 2   Tiotropium Bromide Monohydrate (SPIRIVA RESPIMAT) 2.5 MCG/ACT AERS Inhale 2 puffs into the lungs daily. (Patient taking differently: Inhale 2 puffs into the lungs daily as needed (shortness of breath).) 4 g 6   traMADol (ULTRAM) 50 MG tablet Take 2 tablets (100 mg total) by mouth 2 (two) times daily. (Patient taking differently: Take 100 mg by mouth every 12 (twelve) hours as needed for moderate pain.) 120 tablet 5   vitamin B-12 (CYANOCOBALAMIN) 1000 MCG tablet Take 1 tablet (1,000 mcg total) by mouth daily. 30 tablet 2   No facility-administered medications prior to visit.   Allergies  Allergen Reactions   Celebrex [Celecoxib] Hives and Itching   Tape     Tears skin. Prefers paper tape Please    Objective:   Today's Vitals   07/18/21 1123  BP: 120/66  Pulse: 73  Temp: (!) 97 F (36.1 C)  TempSrc: Temporal  SpO2: 94%  Weight: 166 lb (75.3 kg)  Height: 5' 6.5" (1.689 m)   Body mass index is 26.39 kg/m.   General: Well developed, well nourished. No acute distress. HEENT: Normocephalic, non-traumatic. Conjunctiva clear. Nose clear without congestion or rhinorrhea.   Mucous membranes moist. Oropharynx clear with stable postoperative findings. Good dentition. Neck: Supple. No lymphadenopathy. No thyromegaly. Lungs: Bilateral wheezing and mild diffuse ronchi, but with good air movement. Psych:  Alert and oriented. Normal mood and affect.  Health Maintenance Due  Topic Date Due   Zoster Vaccines- Shingrix (1 of 2) Never done   COLONOSCOPY (Pts 45-53yr Insurance coverage will need to be confirmed)  03/22/2020     Assessment & Plan:   1. Acute exacerbation of chronic bronchitis (HCC) I will prescribe a course of doxycycline and provide a steroid burst for 1 week to resolve this acute exacerbation. He will follow-up if not improving or if cough/dyspnea worsen.  - doxycycline (VIBRA-TABS) 100 MG tablet; Take 1 tablet (100 mg total) by mouth 2 (two) times daily.  Dispense: 20 tablet; Refill: 0 - predniSONE (DELTASONE) 20 MG tablet; Take 2 tablets (40 mg total) by mouth daily with breakfast.  Dispense: 14 tablet; Refill: 0   Return in about 3 months (around 10/18/2021) for Reassessment.   SHaydee Salter MD

## 2021-07-25 ENCOUNTER — Encounter: Payer: Self-pay | Admitting: Gastroenterology

## 2021-07-25 ENCOUNTER — Ambulatory Visit (INDEPENDENT_AMBULATORY_CARE_PROVIDER_SITE_OTHER): Payer: Medicare Other | Admitting: Gastroenterology

## 2021-07-25 ENCOUNTER — Ambulatory Visit: Payer: Medicare Other | Admitting: Family Medicine

## 2021-07-25 VITALS — BP 118/56 | HR 65 | Ht 66.5 in | Wt 166.4 lb

## 2021-07-25 DIAGNOSIS — K219 Gastro-esophageal reflux disease without esophagitis: Secondary | ICD-10-CM | POA: Diagnosis not present

## 2021-07-25 DIAGNOSIS — D509 Iron deficiency anemia, unspecified: Secondary | ICD-10-CM

## 2021-07-25 NOTE — Progress Notes (Addendum)
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 If iron deficiency is refractory we can reconsider GI evaluation.  Continue pantoprazole 40 mg po qd and follow antireflux measures.  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  He was seen as a hospital consultation in late May by Dr. Silverio Decamp for B12 deficiency and iron deficiency anemia with heme negative stool.  In addition he had an acute gastroenteritis with nausea, vomiting, diarrhea that resolved rapidly.  No endoscopic evaluation was performed during the hospitalization.  He returns today with no gastrointestinal complaints.  His appetite is good and he relates normal bowel movements.  He was started on B12 but not he has not been started on iron supplements.  EGD 03/23/2015: 1. Short stricture at the gastroesophageal junction; dilated - Savary dilators over guidewire 2. Small hiatal hernia   Colonoscopy 03/23/2015:  1. Two sessile polyps in the sigmoid colon and transverse colon;  polypectomies performed with a cold snare 2. Moderate diverticulosis in the sigmoid colon and descending colon 3. Mild diverticulosis in the transverse colon 4. Grade l internal hemorrhoids TUBULAR ADENOMAS (X2). - NO HIGH GRADE DYSPLASIA OR INVASIVE MALIGNANCY IDENTIFIED.   Labs / Imaging       Latest Ref Rng & Units 06/26/2021    9:04 AM 06/20/2021    4:35 PM 02/28/2021   11:50 AM  Hepatic Function  Total Protein 6.5 - 8.1 g/dL 6.0  7.7  6.7   Albumin 3.5 - 5.0 g/dL 3.2  4.3  3.4   AST 15 - 41 U/L '13  17  11   ' ALT 0 - 44 U/L '13  17  14   ' Alk Phosphatase 38 - 126 U/L 103  123  110   Total Bilirubin 0.3 - 1.2 mg/dL 0.6  0.5  0.6        Latest Ref Rng & Units 06/28/2021    2:15 AM 06/27/2021    3:35 AM 06/26/2021    7:22 PM  CBC  WBC 4.0 - 10.5 K/uL 3.9  5.2    Hemoglobin 13.0 - 17.0 g/dL 10.1  9.5  7.8   Hematocrit 39.0 - 52.0 % 32.4  30.5  25.9   Platelets 150 - 400 K/uL 104  101      Current Medications, Allergies, Past Medical History, Past Surgical History, Family History and Social History were reviewed in [employer] record.   Physical Exam: General: Well developed, well nourished, no acute distress Head: Normocephalic and atraumatic Eyes: Sclerae anicteric, EOMI Ears: Normal auditory acuity Mouth: Not examined Lungs:  Decrease BS bilaterally, insp and exp wheezes throughout Heart: Regular rate and rhythm; no murmurs, rubs or bruits Abdomen: Soft, non tender and non distended. No masses, hepatosplenomegaly or hernias noted. Normal Bowel sounds Rectal: Not done Musculoskeletal: Symmetrical with no gross deformities  Pulses:  Normal pulses noted Extremities: No clubbing, cyanosis, edema or deformities noted Neurological: Alert oriented x 4, grossly nonfocal Psychological:  Alert and cooperative. Normal mood and affect   Margerite Impastato T. Fuller Plan, MD 07/25/2021, 9:12 AM

## 2021-07-25 NOTE — Patient Instructions (Signed)
Start over the counter ferrous sulfate 325 mg daily with breakfast.   Follow up with your primary care physician and follow up with [place] as needed.   The [facility] providers would like to encourage you to use [facility] to communicate with providers for non-urgent requests or questions.  Due to long hold times on the telephone, sending your provider a message by [facility] may be a faster and more efficient way to get a response.  Please allow 48 business hours for a response.  Please remember that this is for non-urgent requests.   Thank you for choosing me and [facility].  Pricilla Riffle. Dagoberto Ligas., MD., Marval Regal

## 2021-08-01 ENCOUNTER — Telehealth: Payer: Self-pay

## 2021-08-02 ENCOUNTER — Other Ambulatory Visit: Payer: Self-pay | Admitting: Physician Assistant

## 2021-08-02 MED ORDER — OXYCODONE HCL 10 MG PO TABS: 10.0000 mg | ORAL_TABLET | Freq: Four times a day (QID) | ORAL | 0 refills | Status: DC | PRN

## 2021-08-02 NOTE — Telephone Encounter (Signed)
Patient's wife notified of medication sent to pharmacy

## 2021-08-02 NOTE — Telephone Encounter (Signed)
Next Visit: 10/12/2021  Last Visit: 07/06/2021  Last Fill:40 MG prednisone given to patient by Dr. Gena Fray on 07/18/2021 for Chronic Bronchitis - Please advise. 05/22/2021 1 mg 05/18/2021 5 mg  Dx:  Polymyalgia rheumatica    Current Dose per office note on 07/06/2021: The plan is to continue to try to taper prednisone by 1 mg every 2 months, prednisone 9 mg p.o. daily,  He was advised to taper prednisone by 1 mg every 2 months.  He will be reducing the dose to prednisone 8 mg p.o. daily now.  Okay to refill prednisone?

## 2021-08-02 NOTE — Addendum Note (Signed)
Addended by: Courtney Heys on: 08/02/2021 09:02 AM   Modules accepted: Orders

## 2021-08-09 ENCOUNTER — Telehealth: Payer: Self-pay | Admitting: Adult Health

## 2021-08-09 NOTE — Telephone Encounter (Signed)
Rescheduled appointment per provider requested time off. Left voicemail for patient.

## 2021-08-14 DIAGNOSIS — M5136 Other intervertebral disc degeneration, lumbar region: Secondary | ICD-10-CM | POA: Diagnosis not present

## 2021-08-14 DIAGNOSIS — G894 Chronic pain syndrome: Secondary | ICD-10-CM | POA: Diagnosis not present

## 2021-08-14 DIAGNOSIS — M159 Polyosteoarthritis, unspecified: Secondary | ICD-10-CM | POA: Diagnosis not present

## 2021-08-14 DIAGNOSIS — Y92009 Unspecified place in unspecified non-institutional (private) residence as the place of occurrence of the external cause: Secondary | ICD-10-CM | POA: Diagnosis not present

## 2021-08-17 ENCOUNTER — Inpatient Hospital Stay (HOSPITAL_COMMUNITY)
Admission: EM | Admit: 2021-08-17 | Discharge: 2021-08-21 | DRG: 871 | Disposition: A | Discharge status: Home Health | Payer: Medicare Other | Attending: Internal Medicine | Admitting: Internal Medicine

## 2021-08-17 ENCOUNTER — Emergency Department (HOSPITAL_COMMUNITY): Payer: Medicare Other

## 2021-08-17 ENCOUNTER — Other Ambulatory Visit: Payer: Self-pay

## 2021-08-17 ENCOUNTER — Encounter (HOSPITAL_COMMUNITY): Payer: Self-pay

## 2021-08-17 DIAGNOSIS — Z808 Family history of malignant neoplasm of other organs or systems: Secondary | ICD-10-CM

## 2021-08-17 DIAGNOSIS — I9589 Other hypotension: Secondary | ICD-10-CM | POA: Diagnosis present

## 2021-08-17 DIAGNOSIS — I959 Hypotension, unspecified: Secondary | ICD-10-CM | POA: Diagnosis not present

## 2021-08-17 DIAGNOSIS — E782 Mixed hyperlipidemia: Secondary | ICD-10-CM | POA: Diagnosis not present

## 2021-08-17 DIAGNOSIS — E785 Hyperlipidemia, unspecified: Secondary | ICD-10-CM | POA: Diagnosis not present

## 2021-08-17 DIAGNOSIS — I7133 Infrarenal abdominal aortic aneurysm, ruptured: Secondary | ICD-10-CM | POA: Diagnosis not present

## 2021-08-17 DIAGNOSIS — E860 Dehydration: Secondary | ICD-10-CM | POA: Diagnosis present

## 2021-08-17 DIAGNOSIS — Z953 Presence of xenogenic heart valve: Secondary | ICD-10-CM

## 2021-08-17 DIAGNOSIS — M353 Polymyalgia rheumatica: Secondary | ICD-10-CM | POA: Diagnosis not present

## 2021-08-17 DIAGNOSIS — A419 Sepsis, unspecified organism: Secondary | ICD-10-CM | POA: Diagnosis not present

## 2021-08-17 DIAGNOSIS — Z8679 Personal history of other diseases of the circulatory system: Secondary | ICD-10-CM

## 2021-08-17 DIAGNOSIS — E875 Hyperkalemia: Secondary | ICD-10-CM | POA: Diagnosis present

## 2021-08-17 DIAGNOSIS — D638 Anemia in other chronic diseases classified elsewhere: Secondary | ICD-10-CM | POA: Diagnosis present

## 2021-08-17 DIAGNOSIS — Z87891 Personal history of nicotine dependence: Secondary | ICD-10-CM

## 2021-08-17 DIAGNOSIS — N179 Acute kidney failure, unspecified: Secondary | ICD-10-CM | POA: Diagnosis not present

## 2021-08-17 DIAGNOSIS — I10 Essential (primary) hypertension: Secondary | ICD-10-CM | POA: Diagnosis not present

## 2021-08-17 DIAGNOSIS — Z7983 Long term (current) use of bisphosphonates: Secondary | ICD-10-CM

## 2021-08-17 DIAGNOSIS — M545 Low back pain, unspecified: Secondary | ICD-10-CM | POA: Diagnosis present

## 2021-08-17 DIAGNOSIS — E872 Acidosis, unspecified: Secondary | ICD-10-CM | POA: Diagnosis present

## 2021-08-17 DIAGNOSIS — Z79899 Other long term (current) drug therapy: Secondary | ICD-10-CM

## 2021-08-17 DIAGNOSIS — R0789 Other chest pain: Secondary | ICD-10-CM | POA: Diagnosis not present

## 2021-08-17 DIAGNOSIS — F32A Depression, unspecified: Secondary | ICD-10-CM | POA: Diagnosis present

## 2021-08-17 DIAGNOSIS — D649 Anemia, unspecified: Secondary | ICD-10-CM | POA: Diagnosis not present

## 2021-08-17 DIAGNOSIS — Z800 Family history of malignant neoplasm of digestive organs: Secondary | ICD-10-CM

## 2021-08-17 DIAGNOSIS — K219 Gastro-esophageal reflux disease without esophagitis: Secondary | ICD-10-CM | POA: Diagnosis not present

## 2021-08-17 DIAGNOSIS — I701 Atherosclerosis of renal artery: Secondary | ICD-10-CM | POA: Diagnosis not present

## 2021-08-17 DIAGNOSIS — E861 Hypovolemia: Secondary | ICD-10-CM | POA: Diagnosis not present

## 2021-08-17 DIAGNOSIS — R0602 Shortness of breath: Secondary | ICD-10-CM | POA: Diagnosis not present

## 2021-08-17 DIAGNOSIS — I2510 Atherosclerotic heart disease of native coronary artery without angina pectoris: Secondary | ICD-10-CM | POA: Diagnosis present

## 2021-08-17 DIAGNOSIS — E663 Overweight: Secondary | ICD-10-CM | POA: Diagnosis present

## 2021-08-17 DIAGNOSIS — D696 Thrombocytopenia, unspecified: Secondary | ICD-10-CM | POA: Diagnosis not present

## 2021-08-17 DIAGNOSIS — R54 Age-related physical debility: Secondary | ICD-10-CM | POA: Diagnosis present

## 2021-08-17 DIAGNOSIS — J9601 Acute respiratory failure with hypoxia: Secondary | ICD-10-CM | POA: Diagnosis present

## 2021-08-17 DIAGNOSIS — I110 Hypertensive heart disease with heart failure: Secondary | ICD-10-CM | POA: Diagnosis not present

## 2021-08-17 DIAGNOSIS — I5032 Chronic diastolic (congestive) heart failure: Secondary | ICD-10-CM | POA: Diagnosis not present

## 2021-08-17 DIAGNOSIS — Z955 Presence of coronary angioplasty implant and graft: Secondary | ICD-10-CM

## 2021-08-17 DIAGNOSIS — J189 Pneumonia, unspecified organism: Secondary | ICD-10-CM | POA: Diagnosis present

## 2021-08-17 DIAGNOSIS — Z85818 Personal history of malignant neoplasm of other sites of lip, oral cavity, and pharynx: Secondary | ICD-10-CM

## 2021-08-17 DIAGNOSIS — Z7982 Long term (current) use of aspirin: Secondary | ICD-10-CM

## 2021-08-17 DIAGNOSIS — F419 Anxiety disorder, unspecified: Secondary | ICD-10-CM | POA: Diagnosis present

## 2021-08-17 DIAGNOSIS — G8929 Other chronic pain: Secondary | ICD-10-CM | POA: Diagnosis present

## 2021-08-17 DIAGNOSIS — Z888 Allergy status to other drugs, medicaments and biological substances status: Secondary | ICD-10-CM

## 2021-08-17 DIAGNOSIS — Z820 Family history of epilepsy and other diseases of the nervous system: Secondary | ICD-10-CM

## 2021-08-17 DIAGNOSIS — M549 Dorsalgia, unspecified: Secondary | ICD-10-CM | POA: Diagnosis not present

## 2021-08-17 DIAGNOSIS — Z6827 Body mass index (BMI) 27.0-27.9, adult: Secondary | ICD-10-CM

## 2021-08-17 DIAGNOSIS — Z66 Do not resuscitate: Secondary | ICD-10-CM | POA: Diagnosis not present

## 2021-08-17 DIAGNOSIS — Z96642 Presence of left artificial hip joint: Secondary | ICD-10-CM | POA: Diagnosis present

## 2021-08-17 DIAGNOSIS — R079 Chest pain, unspecified: Secondary | ICD-10-CM | POA: Diagnosis not present

## 2021-08-17 DIAGNOSIS — D509 Iron deficiency anemia, unspecified: Secondary | ICD-10-CM | POA: Diagnosis present

## 2021-08-17 DIAGNOSIS — N400 Enlarged prostate without lower urinary tract symptoms: Secondary | ICD-10-CM | POA: Diagnosis present

## 2021-08-17 DIAGNOSIS — R0902 Hypoxemia: Secondary | ICD-10-CM | POA: Diagnosis not present

## 2021-08-17 DIAGNOSIS — K2100 Gastro-esophageal reflux disease with esophagitis, without bleeding: Secondary | ICD-10-CM | POA: Diagnosis not present

## 2021-08-17 DIAGNOSIS — R6520 Severe sepsis without septic shock: Secondary | ICD-10-CM | POA: Diagnosis present

## 2021-08-17 DIAGNOSIS — Z8261 Family history of arthritis: Secondary | ICD-10-CM

## 2021-08-17 DIAGNOSIS — Z8673 Personal history of transient ischemic attack (TIA), and cerebral infarction without residual deficits: Secondary | ICD-10-CM

## 2021-08-17 DIAGNOSIS — M1990 Unspecified osteoarthritis, unspecified site: Secondary | ICD-10-CM | POA: Diagnosis present

## 2021-08-17 DIAGNOSIS — Z7952 Long term (current) use of systemic steroids: Secondary | ICD-10-CM

## 2021-08-17 DIAGNOSIS — Z8249 Family history of ischemic heart disease and other diseases of the circulatory system: Secondary | ICD-10-CM

## 2021-08-17 LAB — POC OCCULT BLOOD, ED: Fecal Occult Bld: POSITIVE — AB

## 2021-08-17 LAB — BASIC METABOLIC PANEL
Anion gap: 8 (ref 5–15)
BUN: 63 mg/dL — ABNORMAL HIGH (ref 8–23)
CO2: 22 mmol/L (ref 22–32)
Calcium: 7.1 mg/dL — ABNORMAL LOW (ref 8.9–10.3)
Chloride: 104 mmol/L (ref 98–111)
Creatinine, Ser: 2.46 mg/dL — ABNORMAL HIGH (ref 0.61–1.24)
GFR, Estimated: 26 mL/min — ABNORMAL LOW (ref >60)
Glucose, Bld: 94 mg/dL (ref 70–99)
Potassium: 5.3 mmol/L — ABNORMAL HIGH (ref 3.5–5.1)
Sodium: 134 mmol/L — ABNORMAL LOW (ref 135–145)

## 2021-08-17 LAB — CBC WITH DIFFERENTIAL/PLATELET
Abs Immature Granulocytes: 0.1 10*3/uL — ABNORMAL HIGH (ref 0.00–0.07)
Band Neutrophils: 2 %
Basophils Absolute: 0 10*3/uL (ref 0.0–0.1)
Basophils Relative: 0 %
Eosinophils Absolute: 0 10*3/uL (ref 0.0–0.5)
Eosinophils Relative: 0 %
HCT: 24.4 % — ABNORMAL LOW (ref 39.0–52.0)
Hemoglobin: 7.6 g/dL — ABNORMAL LOW (ref 13.0–17.0)
Lymphocytes Relative: 8 %
Lymphs Abs: 0.7 10*3/uL (ref 0.7–4.0)
MCH: 28.5 pg (ref 26.0–34.0)
MCHC: 31.1 g/dL (ref 30.0–36.0)
MCV: 91.4 fL (ref 80.0–100.0)
Monocytes Absolute: 0.2 10*3/uL (ref 0.1–1.0)
Monocytes Relative: 2 %
Myelocytes: 1 %
Neutro Abs: 7.6 10*3/uL (ref 1.7–7.7)
Neutrophils Relative %: 87 %
Platelets: 102 10*3/uL — ABNORMAL LOW (ref 150–400)
RBC: 2.67 MIL/uL — ABNORMAL LOW (ref 4.22–5.81)
RDW: 18 % — ABNORMAL HIGH (ref 11.5–15.5)
WBC: 8.5 10*3/uL (ref 4.0–10.5)
nRBC: 0 % (ref 0.0–0.2)
nRBC: 0 /100 WBC

## 2021-08-17 LAB — I-STAT CHEM 8, ED
BUN: 69 mg/dL — ABNORMAL HIGH (ref 8–23)
Calcium, Ion: 1 mmol/L — ABNORMAL LOW (ref 1.15–1.40)
Chloride: 103 mmol/L (ref 98–111)
Creatinine, Ser: 2.6 mg/dL — ABNORMAL HIGH (ref 0.61–1.24)
Glucose, Bld: 82 mg/dL (ref 70–99)
HCT: 22 % — ABNORMAL LOW (ref 39.0–52.0)
Hemoglobin: 7.5 g/dL — ABNORMAL LOW (ref 13.0–17.0)
Potassium: 5.3 mmol/L — ABNORMAL HIGH (ref 3.5–5.1)
Sodium: 133 mmol/L — ABNORMAL LOW (ref 135–145)
TCO2: 20 mmol/L — ABNORMAL LOW (ref 22–32)

## 2021-08-17 LAB — HEPATIC FUNCTION PANEL
ALT: 15 U/L (ref 0–44)
AST: 23 U/L (ref 15–41)
Albumin: 2.3 g/dL — ABNORMAL LOW (ref 3.5–5.0)
Alkaline Phosphatase: 71 U/L (ref 38–126)
Bilirubin, Direct: 0.2 mg/dL (ref 0.0–0.2)
Indirect Bilirubin: 0.6 mg/dL (ref 0.3–0.9)
Total Bilirubin: 0.8 mg/dL (ref 0.3–1.2)
Total Protein: 4.4 g/dL — ABNORMAL LOW (ref 6.5–8.1)

## 2021-08-17 LAB — LACTIC ACID, PLASMA
Lactic Acid, Venous: 1.7 mmol/L (ref 0.5–1.9)
Lactic Acid, Venous: 0.8 mmol/L (ref 0.5–1.9)

## 2021-08-17 LAB — SAMPLE TO BLOOD BANK

## 2021-08-17 MED ORDER — SODIUM CHLORIDE 0.9 % IV BOLUS
500.0000 mL | Freq: Once | INTRAVENOUS | Status: AC
Start: 2021-08-17 — End: 2021-08-17
  Administered 2021-08-17: 500 mL via INTRAVENOUS

## 2021-08-17 MED ORDER — LACTATED RINGERS IV SOLN: INTRAVENOUS | Status: DC

## 2021-08-17 MED ORDER — ACETAMINOPHEN 650 MG RE SUPP: 650.0000 mg | Freq: Four times a day (QID) | RECTAL | Status: DC | PRN

## 2021-08-17 MED ORDER — SODIUM CHLORIDE 0.9 % IV BOLUS
500.0000 mL | Freq: Once | INTRAVENOUS | Status: AC
  Administered 2021-08-17: 500 mL via INTRAVENOUS

## 2021-08-17 MED ORDER — ACETAMINOPHEN 325 MG PO TABS
650.0000 mg | ORAL_TABLET | Freq: Four times a day (QID) | ORAL | Status: DC | PRN
  Administered 2021-08-19 – 2021-08-21 (×3): 650 mg via ORAL
  Filled 2021-08-17 (×3): qty 2

## 2021-08-17 MED ORDER — IOHEXOL 350 MG/ML SOLN
80.0000 mL | Freq: Once | INTRAVENOUS | Status: AC | PRN
  Administered 2021-08-17: 80 mL via INTRAVENOUS

## 2021-08-17 NOTE — ED Triage Notes (Signed)
Pt BIB [facility] from home d/t Rt lower back pain (reports he has a lumbar fusion in the past) 7 does take regular cortisone shots & oxycodone as his pain regimen. EMS reports that upon their arrival they found that he is hypotensive at 70/40 & 82% on RA. Hx of COPD, Lung sounds have Rhonchi. Pt has an abd aortic aneurysm & had a coronary stent place 2 weeks ago. 18g PIV in Lt AC placed & fluids given, BP came up to 88/46. 84 bpm, 16 Resp, 93% O2 on 4L via n/c, A/Ox4.

## 2021-08-17 NOTE — ED Notes (Signed)
O2 saturation 89% noted on 3.5L nasal cannula. Pt is sleeping in no acute distress. Did not increase O2 flow due to pt hx COPD. Will continue to monitor.

## 2021-08-17 NOTE — ED Notes (Signed)
Notified provider of pt vitals and received verbal order for 514m normal saline bolus.

## 2021-08-17 NOTE — ED Notes (Signed)
Pt returned from CT °

## 2021-08-17 NOTE — ED Notes (Signed)
Patient transported to CT 

## 2021-08-17 NOTE — ED Provider Notes (Signed)
Clinical Course as of 08/17/21 2004  Thu Aug 17, 2021  3893 Patient has moderate to high risk for complications of aortic aneurysm and/or stenting versus dissection causing hypotension.  Risk of IV contrast, is less than the benefit gained from imaging the aorta.  Therefore I have asked radiology to proceed with CT imaging knowing that his creatinine is elevated at 2.6 [EW]    Clinical Course User Index [EW] Daleen Bo, MD     Daleen Bo, MD 08/17/21 2232

## 2021-08-17 NOTE — ED Provider Notes (Signed)
[facility] EMERGENCY DEPARTMENT Provider Note   CSN: [id number] Arrival date & time: 08/17/21  1700     History  Chief Complaint  Patient presents with   Rt lower Back pain   Hypotension    TREGAN READ is a 81 y.o. male.  HPI Presents for evaluation of low back pain, right-sided going into his right leg, that started yesterday and has gotten worse.  He has had similar pains in the back previously and has been treated with injections.  He is due to have an MRI of his back soon.  He was hospitalized 2 months ago with a metabolic encephalopathy and had acute renal failure at that time.  He presents today by EMS who found him hypotensive in the field, treated him with 1 L of saline and transferred him here.  He lives with his wife who is not currently here with him.  He has not been vomiting.  He does not think he has had a fever.    Home Medications Prior to Admission medications   Medication Sig Start Date End Date Taking? Authorizing Provider  acetaminophen (TYLENOL) 500 MG tablet Take 1,000 mg by mouth every 8 (eight) hours as needed for mild pain.    [provider]  albuterol (VENTOLIN HFA) 108 (90 Base) MCG/ACT inhaler Inhale 2 puffs into the lungs every 6 (six) hours as needed for wheezing or shortness of breath. 01/24/21   Haydee Salter, MD  alendronate (FOSAMAX) 70 MG tablet Take 1 tablet (70 mg total) by mouth once a week. Take with a full glass of water on an empty stomach. Patient taking differently: Take 70 mg by mouth every Tuesday. Take with a full glass of water on an empty stomach. 01/26/21   Bo Merino, MD  allopurinol (ZYLOPRIM) 100 MG tablet TAKE 2 TABLETS(200 MG) BY MOUTH DAILY 07/12/21   Ofilia Neas, PA-C  amLODipine (NORVASC) 10 MG tablet TAKE 1 TABLET(10 MG) BY MOUTH DAILY Patient taking differently: Take 10 mg by mouth daily. 06/26/21   Larey Dresser, MD  aspirin EC 81 MG tablet Take 81 mg by mouth daily. Swallow whole.     [provider]  atorvastatin (LIPITOR) 80 MG tablet Take 1 tablet (80 mg total) by mouth daily. 03/21/21   Larey Dresser, MD  carvedilol (COREG) 12.5 MG tablet Take 1 tablet (12.5 mg total) by mouth 2 (two) times daily. 03/24/21 03/24/22  Larey Dresser, MD  Cholecalciferol 25 MCG (1000 UT) tablet Take 1,000 Units by mouth daily.    [provider]  citalopram (CELEXA) 20 MG tablet TAKE 1 TABLET(20 MG) BY MOUTH DAILY Patient taking differently: Take 20 mg by mouth daily. 01/20/21   Lovorn, Jinny Blossom, MD  doxycycline (VIBRA-TABS) 100 MG tablet Take 1 tablet (100 mg total) by mouth 2 (two) times daily. 07/18/21   Haydee Salter, MD  ezetimibe (ZETIA) 10 MG tablet Take 1 tablet (10 mg total) by mouth daily. 05/25/21 05/25/22  Larey Dresser, MD  fluticasone (FLONASE) 50 MCG/ACT nasal spray SHAKE LIQUID AND USE 2 SPRAYS IN EACH NOSTRIL DAILY AS NEEDED FOR RHINITIS OR ALLERGIES Patient taking differently: Place 2 sprays into both nostrils daily as needed for rhinitis or allergies. 11/14/20   Haydee Salter, MD  furosemide (LASIX) 20 MG tablet Take 20 mg by mouth daily.    [provider]  gabapentin (NEURONTIN) 300 MG capsule Take 300 mg by mouth 3 (three) times daily. 07/04/21  [provider]  hydrOXYzine (VISTARIL) 25 MG capsule Take 1 capsule (25 mg total) by mouth every 8 (eight) hours as needed. 06/23/21   Haydee Salter, MD  ipratropium (ATROVENT) 0.03 % nasal spray Place 2 sprays into both nostrils every 12 (twelve) hours. Patient taking differently: Place 2 sprays into both nostrils 2 (two) times daily as needed for rhinitis (allergies). 01/24/21   Haydee Salter, MD  lisinopril (ZESTRIL) 20 MG tablet Take 20 mg by mouth daily.    [provider]  ondansetron (ZOFRAN-ODT) 4 MG disintegrating tablet Take 1 tablet (4 mg total) by mouth every 8 (eight) hours as needed for nausea or vomiting. 06/20/21   Haydee Salter, MD  Oxycodone HCl 10 MG TABS Take 1  tablet (10 mg total) by mouth every 6 (six) hours as needed. 08/02/21   Lovorn, Jinny Blossom, MD  pantoprazole (PROTONIX) 40 MG tablet Take 1 tablet (40 mg total) by mouth daily. 06/30/21 09/28/21  [place] Donnamarie Poag, DO  potassium chloride (KLOR-CON) 10 MEQ tablet Take 2 tablets (20 mEq total) by mouth 2 (two) times daily. 05/01/21   [place], Maricela Bo, FNP  predniSONE (DELTASONE) 1 MG tablet TAKE 2 TABLET BY MOUTH DAILY ALONG WITH 5 MG FOR A TOTAL OF 7 MG DAILY FOR 2 MONTHS THEN REDUCE TO 6 MG DAILY 08/02/21   Bo Merino, MD  predniSONE (DELTASONE) 20 MG tablet Take 2 tablets (40 mg total) by mouth daily with breakfast. 07/18/21   Haydee Salter, MD  predniSONE (DELTASONE) 5 MG tablet Take 1 tablet (5 mg total) by mouth daily with breakfast. 05/18/21   Bo Merino, MD  tamsulosin (FLOMAX) 0.4 MG CAPS capsule Take 0.4 mg by mouth in the morning and at bedtime. 12/23/18   [provider]  thiamine 100 MG tablet Take 1 tablet (100 mg total) by mouth daily. 06/30/21 09/28/21  [place] Donnamarie Poag, DO  Tiotropium Bromide Monohydrate (SPIRIVA RESPIMAT) 2.5 MCG/ACT AERS Inhale 2 puffs into the lungs daily. Patient taking differently: Inhale 2 puffs into the lungs daily as needed (shortness of breath). 01/24/21   Haydee Salter, MD  traMADol (ULTRAM) 50 MG tablet Take 2 tablets (100 mg total) by mouth 2 (two) times daily. Patient taking differently: Take 100 mg by mouth every 12 (twelve) hours as needed for moderate pain. 06/02/21   Lovorn, Jinny Blossom, MD  vitamin B-12 (CYANOCOBALAMIN) 1000 MCG tablet Take 1 tablet (1,000 mcg total) by mouth daily. 06/29/21 09/27/21  [place] Eric J, DO      Allergies    Celebrex [celecoxib] and Tape    Review of Systems   Review of Systems  Physical Exam Updated Vital Signs BP (!) 99/55   Pulse 94   Resp (!) 25   SpO2 90%  Physical Exam Vitals and nursing note reviewed.  Constitutional:      General: He is not in acute distress.    Appearance: He is well-developed.  He is not ill-appearing.     Comments: Frail, elderly  HENT:     Head: Normocephalic and atraumatic.     Right Ear: External ear normal.     Left Ear: External ear normal.  Eyes:     Conjunctiva/sclera: Conjunctivae normal.     Pupils: Pupils are equal, round, and reactive to light.  Neck:     Trachea: Phonation normal.  Cardiovascular:     Rate and Rhythm: Normal rate and regular rhythm.     Heart sounds: Normal heart sounds.  Pulmonary:  Effort: Pulmonary effort is normal. No respiratory distress.     Breath sounds: Normal breath sounds. No stridor.  Abdominal:     General: There is no distension.     Palpations: Abdomen is soft. There is no mass.     Tenderness: There is no abdominal tenderness. There is no guarding.  Genitourinary:    Comments: Normal anus.  Small amount of brown stool in rectal vault.  No rectal mass.  No visible rectal bleeding. Musculoskeletal:        General: Normal range of motion.     Cervical back: Normal range of motion and neck supple.  Skin:    General: Skin is warm and dry.  Neurological:     Mental Status: He is alert and oriented to person, place, and time.     Cranial Nerves: No cranial nerve deficit.     Sensory: No sensory deficit.     Motor: No abnormal muscle tone.     Coordination: Coordination normal.  Psychiatric:        Mood and Affect: Mood normal.        Behavior: Behavior normal.     ED Results / Procedures / Treatments   Labs (all labs ordered are listed, but only abnormal results are displayed) Labs Reviewed  CBC WITH DIFFERENTIAL/PLATELET - Abnormal; Notable for the following components:      Result Value   RBC 2.67 (*)    Hemoglobin 7.6 (*)    HCT 24.4 (*)    RDW 18.0 (*)    Platelets 102 (*)    Abs Immature Granulocytes 0.10 (*)    All other components within normal limits  HEPATIC FUNCTION PANEL - Abnormal; Notable for the following components:   Total Protein 4.4 (*)    Albumin 2.3 (*)    All other  components within normal limits  BASIC METABOLIC PANEL - Abnormal; Notable for the following components:   Sodium 134 (*)    Potassium 5.3 (*)    BUN 63 (*)    Creatinine, Ser 2.46 (*)    Calcium 7.1 (*)    GFR, Estimated 26 (*)    All other components within normal limits  I-STAT CHEM 8, ED - Abnormal; Notable for the following components:   Sodium 133 (*)    Potassium 5.3 (*)    BUN 69 (*)    Creatinine, Ser 2.60 (*)    Calcium, Ion 1.00 (*)    TCO2 20 (*)    Hemoglobin 7.5 (*)    HCT 22.0 (*)    All other components within normal limits  POC OCCULT BLOOD, ED - Abnormal; Notable for the following components:   Fecal Occult Bld POSITIVE (*)    All other components within normal limits  CULTURE, BLOOD (ROUTINE X 2)  CULTURE, BLOOD (ROUTINE X 2)  LACTIC ACID, PLASMA  LACTIC ACID, PLASMA  SAMPLE TO BLOOD BANK    EKG None  Radiology CT Angio Chest/Abd/Pel for Dissection W and/or Wo Contrast  Result Date: 08/17/2021 CLINICAL DATA:  Chest pain or back pain, aortic dissection suspected EXAM: CT ANGIOGRAPHY CHEST, ABDOMEN AND PELVIS TECHNIQUE: Non-contrast CT of the chest was initially obtained. Multidetector CT imaging through the chest, abdomen and pelvis was performed using the standard protocol during bolus administration of intravenous contrast. Multiplanar reconstructed images and MIPs were obtained and reviewed to evaluate the vascular anatomy. RADIATION DOSE REDUCTION: This exam was performed according to the departmental dose-optimization program which includes automated exposure control, adjustment of  the mA and/or kV according to patient size and/or use of iterative reconstruction technique. CONTRAST:  26m OMNIPAQUE IOHEXOL 350 MG/ML SOLN COMPARISON:  CTA abdomen pelvis 03/24/2021, CT chest abdomen pelvis 12/03/2020 FINDINGS: CTA CHEST FINDINGS Cardiovascular: Extensive coronary artery calcification predominantly within the left anterior descending coronary artery.  Transcatheter aortic valve replacement has been performed. Global cardiac size within normal limits. No pericardial effusion. Central pulmonary arteries are enlarged in keeping with changes of pulmonary arterial hypertension, stable since prior examination. Moderate atherosclerotic calcification is seen within the thoracic aorta. No aortic aneurysm, intramural hematoma, or dissection. Mediastinum/Nodes: Visualized thyroid is unremarkable. No pathologic thoracic adenopathy. The esophagus is unremarkable. Lungs/Pleura: Airspace infiltrate is seen throughout the right lung with focal consolidation within the superior segment of the right lower lobe, in keeping with acute infection in the appropriate clinical setting. Associated bronchial wall thickening in keeping with airway inflammation. No central obstructing mass. Small amount of layering debris noted within the right mainstem bronchus and bronchus intermedius. Minimal patchy infiltrate also noted within the left upper lobe. No pneumothorax or pleural effusion. Musculoskeletal: No acute bone abnormality. No lytic or blastic bone lesion. Review of the MIP images confirms the above findings. CTA ABDOMEN AND PELVIS FINDINGS VASCULAR Aorta: Fusiform infrarenal abdominal aortic aneurysm has undergone endovascular repair utilizing a bifurcated stent graft with infrarenal proximal fixation and bilateral common iliac distal fixation. The aneurysm sac is unchanged in size when measured in identical fashion measuring 5.4 x 4.4 cm at axial image # 173/7 (measuring identically on image # 66/7 of previous examination). The repaired segment appears patent. Superimposed extensive atherosclerotic calcification. No periaortic inflammatory change or fluid collections are identified. Celiac: Less than 50% stenosis of the origin, unchanged. Distally widely patent. Variant anatomy with the celiac axis supplying only the left gastric artery and splenic artery. SMA: Replaced common  hepatic artery. Less than 50% stenosis of the origin. Distally widely patent. No aneurysm or dissection. Renals: Dual renal arteries bilaterally with accessory lower pole renal arteries noted. 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 Normal vascular morphology. No aneurysm or dissection. 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 Distally widely patent. Inflow: No evidence of hemodynamically significant stenosis involving the lower extremity arterial inflow bilaterally. Internal iliac arteries are stenotic at their origins, but are patent bilaterally. Veins: No obvious venous abnormality within the limitations of this arterial phase study. Review of the MIP images confirms the above findings. NON-VASCULAR Hepatobiliary: No focal liver abnormality is seen. Status post cholecystectomy. No biliary dilatation. Pancreas: Unremarkable Spleen: Unremarkable Adrenals/Urinary Tract: The adrenal glands are unremarkable. The kidneys are normal. Bladder is unremarkable. Stomach/Bowel: Moderate descending and sigmoid colonic diverticulosis without superimposed acute inflammatory change. The stomach, small bowel, and large bowel are otherwise unremarkable. Appendix normal. No free intraperitoneal gas or fluid. Lymphatic: No pathologic adenopathy within the abdomen and pelvis. Reproductive: Mild prostatic enlargement Other: No abdominal wall hernia Musculoskeletal: Left total hip arthroplasty and L4-5 hemilaminectomies and lumbar discectomy with interbody bone cage placement has been performed. No definite bridging callus identified at L4-5. No acute bone abnormality. No lytic or blastic bone lesion. Review of the MIP images confirms the above findings. IMPRESSION: 1. No evidence of thoracoabdominal aortic dissection. 2. Extensive coronary  artery calcification. Status post transcatheter aortic valve replacement. 3. Multifocal pulmonary infiltrate, most severe within the superior segment of  the right lower lobe, in keeping with acute infection in the appropriate clinical setting. Associated bronchial wall thickening in keeping with airway inflammation. No central obstructing mass identified. 4. Stable changes of pulmonary arterial hypertension. 5. Stable infrarenal abdominal aortic aneurysm status post endovascular repair. Stable aneurysm sac size at 5.4 cm. 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 6. Bilateral renal artery hemodynamically significant stenoses, involving the main right renal artery and both the main as well as accessory lower polar left renal arteries. 7. Moderate distal colonic diverticulosis without superimposed acute inflammatory change. 8. Postsurgical changes of L4-5 hemilaminectomies and lumbar discectomy with interbody bone cage placement. No definite bridging callus identified at L4-5. Electronically Signed   By: Fidela Salisbury M.D.   On: 08/17/2021 23:13    Procedures Procedures    Medications Ordered in ED Medications  sodium chloride 0.9 % bolus 500 mL (0 mLs Intravenous Stopped 08/17/21 1933)  sodium chloride 0.9 % bolus 500 mL (0 mLs Intravenous Stopped 08/17/21 2209)  iohexol (OMNIPAQUE) 350 MG/ML injection 80 mL (80 mLs Intravenous Contrast Given 08/17/21 2246)    ED Course/ Medical Decision Making/ A&P Clinical Course as of 08/17/21 2347  Thu Aug 17, 2021  1942 Patient has moderate to high risk for complications of aortic aneurysm and/or stenting versus dissection causing hypotension.  Risk of IV contrast, is less than the benefit gained from imaging the aorta.  Therefore I have asked radiology to proceed with CT imaging knowing that his creatinine is elevated at 2.6 [EW]    Clinical Course User Index [EW] Daleen Bo, MD                           Medical Decision Making Elderly male with history of aortic  disease treated with stent, presenting with low back pain, right-sided, and hypotension.  Abdomen is soft and nontender.  There is no palpable abdominal pulsations.  I can palpate his right groin pulse but not his left groin pulse.  He is sensate in both feet.  Both feet are warm.  He has multiple comorbidities including hypertension, colon cancer, TIA, liver abscess, status post TAVR, history of aortic and bi-inguinal.  Creatinine was 7 2 months ago when he had acute renal failure episode.  Apparently that was volume related.  He recovered his normal function while in the hospital.  Has history of coronary stenting.  Amount and/or Complexity of Data Reviewed Independent Historian:     Details: He is a cogent historian Labs: ordered.    Details: CBC, metabolic panel, hepatic function panel, lactic acid, blood cultures-normal except sodium low, potassium high, BUN high, creatinine high, calcium-mild, hemoglobin Radiology: ordered and independent interpretation performed.    Details: CT angiogram chest abdomen pelvis-stable chronic aortic stenting and aortic aneurysm Discussion of management or test interpretation with external provider(s): Consult hospitalist admit for management of acute renal failure suspected to be multifactorial.  Risk Prescription drug management. Decision regarding hospitalization. Risk Details: Patient presenting with ongoing low back pain.  He was transferred by EMS found him to be severely hypotensive.  He was treated with IV fluids with some improvement.  Patient did not have abdominal pain on arrival.  He has prior aortic aneurysm with stenting.  Decreased pulse left groin with warm left foot.  Evaluation of aorta with CT angio ordered.  Labs returned with mild renal insufficiency/AKI and mild hyper kalemia.  This is a recurrent problem, last 2 months ago.  At that time he cleared  to normal renal function with symptomatic treatment of IV fluids.  Incidental mild anemia,  without evident source of bleeding.  Etiology of AKI is likely multifactorial he is on both diuretic medication and ACE inhibitor.  Doubt sepsis.  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  However that is likely the cause of his back discomfort.  Critical Care Total time providing critical care: 50 minutes           Final Clinical Impression(s) / ED Diagnoses Final diagnoses:  AKI (acute kidney injury) ([place])  Hypotension due to hypovolemia  Hyperkalemia  Anemia, unspecified type    Rx / DC Orders ED Discharge Orders     None         Daleen Bo, MD 08/17/21 2348

## 2021-08-18 ENCOUNTER — Encounter (HOSPITAL_COMMUNITY): Payer: Self-pay | Admitting: Internal Medicine

## 2021-08-18 ENCOUNTER — Encounter: Payer: Medicare Other | Admitting: Physical Medicine and Rehabilitation

## 2021-08-18 DIAGNOSIS — D649 Anemia, unspecified: Secondary | ICD-10-CM | POA: Diagnosis not present

## 2021-08-18 DIAGNOSIS — J189 Pneumonia, unspecified organism: Secondary | ICD-10-CM | POA: Diagnosis present

## 2021-08-18 DIAGNOSIS — N179 Acute kidney failure, unspecified: Secondary | ICD-10-CM | POA: Diagnosis not present

## 2021-08-18 DIAGNOSIS — K219 Gastro-esophageal reflux disease without esophagitis: Secondary | ICD-10-CM

## 2021-08-18 DIAGNOSIS — J9601 Acute respiratory failure with hypoxia: Secondary | ICD-10-CM | POA: Diagnosis not present

## 2021-08-18 DIAGNOSIS — I959 Hypotension, unspecified: Secondary | ICD-10-CM

## 2021-08-18 LAB — PROTIME-INR
INR: 1.4 — ABNORMAL HIGH (ref 0.8–1.2)
Prothrombin Time: 17.1 seconds — ABNORMAL HIGH (ref 11.4–15.2)

## 2021-08-18 LAB — COMPREHENSIVE METABOLIC PANEL
ALT: 17 U/L (ref 0–44)
AST: 30 U/L (ref 15–41)
Albumin: 2.5 g/dL — ABNORMAL LOW (ref 3.5–5.0)
Alkaline Phosphatase: 91 U/L (ref 38–126)
Anion gap: 8 (ref 5–15)
BUN: 59 mg/dL — ABNORMAL HIGH (ref 8–23)
CO2: 22 mmol/L (ref 22–32)
Calcium: 7.5 mg/dL — ABNORMAL LOW (ref 8.9–10.3)
Chloride: 104 mmol/L (ref 98–111)
Creatinine, Ser: 1.78 mg/dL — ABNORMAL HIGH (ref 0.61–1.24)
GFR, Estimated: 39 mL/min — ABNORMAL LOW (ref >60)
Glucose, Bld: 88 mg/dL (ref 70–99)
Potassium: 5 mmol/L (ref 3.5–5.1)
Sodium: 134 mmol/L — ABNORMAL LOW (ref 135–145)
Total Bilirubin: 0.6 mg/dL (ref 0.3–1.2)
Total Protein: 5 g/dL — ABNORMAL LOW (ref 6.5–8.1)

## 2021-08-18 LAB — CBC WITH DIFFERENTIAL/PLATELET
Abs Immature Granulocytes: 0.09 10*3/uL — ABNORMAL HIGH (ref 0.00–0.07)
Basophils Absolute: 0 10*3/uL (ref 0.0–0.1)
Basophils Relative: 0 %
Eosinophils Absolute: 0 10*3/uL (ref 0.0–0.5)
Eosinophils Relative: 0 %
HCT: 25.7 % — ABNORMAL LOW (ref 39.0–52.0)
Hemoglobin: 8 g/dL — ABNORMAL LOW (ref 13.0–17.0)
Immature Granulocytes: 1 %
Lymphocytes Relative: 6 %
Lymphs Abs: 0.4 10*3/uL — ABNORMAL LOW (ref 0.7–4.0)
MCH: 28.5 pg (ref 26.0–34.0)
MCHC: 31.1 g/dL (ref 30.0–36.0)
MCV: 91.5 fL (ref 80.0–100.0)
Monocytes Absolute: 0.2 10*3/uL (ref 0.1–1.0)
Monocytes Relative: 3 %
Neutro Abs: 6.3 10*3/uL (ref 1.7–7.7)
Neutrophils Relative %: 90 %
Platelets: 83 10*3/uL — ABNORMAL LOW (ref 150–400)
RBC: 2.81 MIL/uL — ABNORMAL LOW (ref 4.22–5.81)
RDW: 18.3 % — ABNORMAL HIGH (ref 11.5–15.5)
Smear Review: NORMAL
WBC: 7.1 10*3/uL (ref 4.0–10.5)
nRBC: 0 % (ref 0.0–0.2)

## 2021-08-18 LAB — MRSA NEXT GEN BY PCR, NASAL: MRSA by PCR Next Gen: NOT DETECTED

## 2021-08-18 LAB — URINALYSIS, COMPLETE (UACMP) WITH MICROSCOPIC
Bacteria, UA: NONE SEEN
Bilirubin Urine: NEGATIVE
Glucose, UA: NEGATIVE mg/dL
Ketones, ur: NEGATIVE mg/dL
Leukocytes,Ua: NEGATIVE
Nitrite: NEGATIVE
Protein, ur: NEGATIVE mg/dL
Specific Gravity, Urine: 1.021 (ref 1.005–1.030)
pH: 5 (ref 5.0–8.0)

## 2021-08-18 LAB — I-STAT VENOUS BLOOD GAS, ED
Acid-base deficit: 1 mmol/L (ref 0.0–2.0)
Bicarbonate: 23.1 mmol/L (ref 20.0–28.0)
Calcium, Ion: 1.12 mmol/L — ABNORMAL LOW (ref 1.15–1.40)
HCT: 24 % — ABNORMAL LOW (ref 39.0–52.0)
Hemoglobin: 8.2 g/dL — ABNORMAL LOW (ref 13.0–17.0)
O2 Saturation: 99 %
Potassium: 5 mmol/L (ref 3.5–5.1)
Sodium: 135 mmol/L (ref 135–145)
TCO2: 24 mmol/L (ref 22–32)
pCO2, Ven: 36.9 mmHg — ABNORMAL LOW (ref 44–60)
pH, Ven: 7.404 (ref 7.25–7.43)
pO2, Ven: 136 mmHg — ABNORMAL HIGH (ref 32–45)

## 2021-08-18 LAB — RETICULOCYTES
Immature Retic Fract: 20.8 % — ABNORMAL HIGH (ref 2.3–15.9)
RBC.: 2.77 MIL/uL — ABNORMAL LOW (ref 4.22–5.81)
Retic Count, Absolute: 69.8 10*3/uL (ref 19.0–186.0)
Retic Ct Pct: 2.5 % (ref 0.4–3.1)

## 2021-08-18 LAB — IRON AND TIBC
Iron: 10 ug/dL — ABNORMAL LOW (ref 45–182)
Saturation Ratios: 4 % — ABNORMAL LOW (ref 17.9–39.5)
TIBC: 238 ug/dL — ABNORMAL LOW (ref 250–450)
UIBC: 228 ug/dL

## 2021-08-18 LAB — CORTISOL: Cortisol, Plasma: 8.5 ug/dL

## 2021-08-18 LAB — FERRITIN: Ferritin: 120 ng/mL (ref 24–336)

## 2021-08-18 LAB — SODIUM, URINE, RANDOM: Sodium, Ur: 23 mmol/L

## 2021-08-18 LAB — TYPE AND SCREEN
ABO/RH(D): O POS
Antibody Screen: NEGATIVE

## 2021-08-18 LAB — HEMOGLOBIN AND HEMATOCRIT, BLOOD
HCT: 26.3 % — ABNORMAL LOW (ref 39.0–52.0)
Hemoglobin: 8.5 g/dL — ABNORMAL LOW (ref 13.0–17.0)

## 2021-08-18 LAB — BRAIN NATRIURETIC PEPTIDE: B Natriuretic Peptide: 632.7 pg/mL — ABNORMAL HIGH (ref 0.0–100.0)

## 2021-08-18 LAB — CREATININE, URINE, RANDOM: Creatinine, Urine: 65 mg/dL

## 2021-08-18 LAB — MAGNESIUM
Magnesium: 1.3 mg/dL — ABNORMAL LOW (ref 1.7–2.4)
Magnesium: 1.4 mg/dL — ABNORMAL LOW (ref 1.7–2.4)

## 2021-08-18 LAB — STREP PNEUMONIAE URINARY ANTIGEN: Strep Pneumo Urinary Antigen: NEGATIVE

## 2021-08-18 LAB — PROCALCITONIN: Procalcitonin: 15.99 ng/mL

## 2021-08-18 MED ORDER — PANTOPRAZOLE SODIUM 40 MG IV SOLR
40.0000 mg | Freq: Every day | INTRAVENOUS | Status: DC
  Administered 2021-08-18: 40 mg via INTRAVENOUS
  Filled 2021-08-18: qty 10

## 2021-08-18 MED ORDER — LACTATED RINGERS IV SOLN: INTRAVENOUS | Status: AC

## 2021-08-18 MED ORDER — EZETIMIBE 10 MG PO TABS
10.0000 mg | ORAL_TABLET | Freq: Every day | ORAL | Status: DC
  Administered 2021-08-18 – 2021-08-21 (×4): 10 mg via ORAL
  Filled 2021-08-18 (×4): qty 1

## 2021-08-18 MED ORDER — SODIUM CHLORIDE 0.9 % IV SOLN
500.0000 mg | Freq: Every day | INTRAVENOUS | Status: DC
  Administered 2021-08-18 (×2): 500 mg via INTRAVENOUS
  Filled 2021-08-18 (×5): qty 5

## 2021-08-18 MED ORDER — NALOXONE HCL 0.4 MG/ML IJ SOLN: 0.4000 mg | INTRAMUSCULAR | Status: DC | PRN

## 2021-08-18 MED ORDER — ATORVASTATIN CALCIUM 80 MG PO TABS
80.0000 mg | ORAL_TABLET | Freq: Every day | ORAL | Status: DC
  Administered 2021-08-18 – 2021-08-21 (×4): 80 mg via ORAL
  Filled 2021-08-18: qty 1
  Filled 2021-08-18: qty 2
  Filled 2021-08-18 (×3): qty 1

## 2021-08-18 MED ORDER — TRAMADOL HCL 50 MG PO TABS
100.0000 mg | ORAL_TABLET | Freq: Two times a day (BID) | ORAL | Status: DC | PRN
  Administered 2021-08-18 – 2021-08-21 (×5): 100 mg via ORAL
  Filled 2021-08-18 (×5): qty 2

## 2021-08-18 MED ORDER — SODIUM CHLORIDE 0.9 % IV SOLN
1.0000 g | Freq: Every day | INTRAVENOUS | Status: DC
  Administered 2021-08-18 – 2021-08-20 (×4): 1 g via INTRAVENOUS
  Filled 2021-08-18 (×4): qty 10

## 2021-08-18 MED ORDER — PANTOPRAZOLE SODIUM 40 MG PO TBEC
40.0000 mg | DELAYED_RELEASE_TABLET | Freq: Every day | ORAL | Status: DC
  Administered 2021-08-18 – 2021-08-20 (×3): 40 mg via ORAL
  Filled 2021-08-18 (×3): qty 1

## 2021-08-18 MED ORDER — FENTANYL CITRATE PF 50 MCG/ML IJ SOSY: 50.0000 ug | PREFILLED_SYRINGE | INTRAMUSCULAR | Status: DC | PRN

## 2021-08-18 MED ORDER — CITALOPRAM HYDROBROMIDE 20 MG PO TABS
20.0000 mg | ORAL_TABLET | Freq: Every day | ORAL | Status: DC
  Administered 2021-08-18 – 2021-08-21 (×4): 20 mg via ORAL
  Filled 2021-08-18 (×3): qty 1
  Filled 2021-08-18: qty 2

## 2021-08-18 MED ORDER — METHYLPREDNISOLONE SODIUM SUCC 125 MG IJ SOLR
80.0000 mg | Freq: Two times a day (BID) | INTRAMUSCULAR | Status: DC
  Administered 2021-08-18 – 2021-08-19 (×4): 80 mg via INTRAVENOUS
  Filled 2021-08-18 (×4): qty 2

## 2021-08-18 MED ORDER — GUAIFENESIN ER 600 MG PO TB12
1200.0000 mg | ORAL_TABLET | Freq: Two times a day (BID) | ORAL | Status: DC
  Administered 2021-08-18 – 2021-08-21 (×6): 1200 mg via ORAL
  Filled 2021-08-18 (×6): qty 2

## 2021-08-18 MED ORDER — IPRATROPIUM-ALBUTEROL 0.5-2.5 (3) MG/3ML IN SOLN
3.0000 mL | Freq: Four times a day (QID) | RESPIRATORY_TRACT | Status: DC
  Administered 2021-08-18 – 2021-08-19 (×6): 3 mL via RESPIRATORY_TRACT
  Filled 2021-08-18 (×7): qty 3

## 2021-08-18 MED ORDER — ALBUTEROL SULFATE (2.5 MG/3ML) 0.083% IN NEBU
2.5000 mg | INHALATION_SOLUTION | RESPIRATORY_TRACT | Status: DC | PRN
  Administered 2021-08-18: 2.5 mg via RESPIRATORY_TRACT
  Filled 2021-08-18: qty 3

## 2021-08-18 MED ORDER — OXYCODONE HCL 5 MG PO TABS
10.0000 mg | ORAL_TABLET | Freq: Four times a day (QID) | ORAL | Status: DC | PRN
  Administered 2021-08-18 – 2021-08-21 (×12): 10 mg via ORAL
  Filled 2021-08-18 (×12): qty 2

## 2021-08-18 MED ORDER — VITAMIN B-12 1000 MCG PO TABS
1000.0000 ug | ORAL_TABLET | Freq: Every day | ORAL | Status: DC
  Administered 2021-08-18 – 2021-08-21 (×4): 1000 ug via ORAL
  Filled 2021-08-18 (×4): qty 1

## 2021-08-18 NOTE — ED Notes (Signed)
Pt O2 switched to 6L [place] so he can eat. Pt tolerating well.

## 2021-08-18 NOTE — Hospital Course (Signed)
The patient is a 81 year old overweight Caucasian male with a past medical history significant for but not limited to abdominal aortic aneurysm status post endovascular stent graft in January 2023, GERD, hypertension, hyperlipidemia, history of severe aortic stenosis status post TAVR, history of chronic low back pain status post L4-L5 hemilaminectomy and discectomy with continued severe back pain, chronic diastolic CHF, anemia chronic disease with hemoglobin of 8-10 as well as other comorbidities who presented to the hospital with complaints of worsening low back pain.  Patient states that he was seen at [facility] physician and he was scheduled for an outpatient MRI but states that he had worsening back pain on the right side and became extremely weak.  He has been taking oxycodone 10 mg every 6 scheduled and noted that he continued to have significant pain.  He also states that he had 2 to 3-day period of nonproductive cough associated with some shortness of breath with no orthopnea, PND or worsening peripheral edema.  He ate new was noted to be hospitalized in May 2023 for AKI with mild hyperkalemia and at that time his serum creatinine was noted to be 7.43 and improved back to baseline of approximately 1.  Patient currently does not require any supplemental oxygen and only takes Breo and albuterol as needed given that he has a 75-year pack history of smoking.  Coming to the ED he was found to have some hypoxia and tachypnea and had to be placed on 4 L of supplemental oxygen via nasal cannula.  Labs showed an elevated BUN/creatinine with an AKI again.  CT of the chest abdomen pelvis dissection protocol was done to evaluate the patient's low blood pressure in the setting of his abdominal aortic aneurysm and showed a stable infrarenal abdominal aortic aneurysm status post endovascular repair.  Also did show evidence of multifocal pulmonary infiltrates consistent with pneumonia and associated bronchial wall  thickening suggestive of airway inflammation with no evidence of edema, pleural effusion or pneumothorax.  A CT of the chest abdomen pelvis also showed evidence of bilateral renal artery stenosis and moderate colonic diverticulosis without evidence of diverticulitis.  He was given IV fluid boluses and admitted for further management of his AKI with hyperkalemia as well as acute respiratory failure with hypoxia and hypotension in the setting of sepsis secondary to community-acquired pneumonia.  His O2 has been weaned to 2 L and he is improving and chest x-ray shows right lung airspace disease.  Today started having some chest pain after he swallowed some pills but his troponin was elevated so cardiology was consulted and initially recommended heparin drip but this is now stopped.  Troponin is trending down and cardiology recommending a limited echo.  Patient is a poor cath candidate given his anemia and heme positive stools along with side thrombocytopenia.

## 2021-08-18 NOTE — ED Notes (Addendum)
RN took over care of patient from [place], [place].   Patient noted to have body twitching, dyspnea at rest and audible wheezing. Admitting provider made aware, waiting on new orders.   PRN nebulizer administered.

## 2021-08-18 NOTE — ED Notes (Signed)
Patient changed by this tech into new brief and cleaned up.

## 2021-08-18 NOTE — Progress Notes (Signed)
PROGRESS NOTE    Brandon [name]  ZOX:[id number] DOB: Mar 02, 1940 DOA: 08/17/2021 PCP: Haydee Salter, MD   Brief Narrative:  The patient is a 81 year old overweight Caucasian male with a past medical history significant for but not limited to abdominal aortic aneurysm status post endovascular stent graft in January 2023, GERD, hypertension, hyperlipidemia, history of severe aortic stenosis status post TAVR, history of chronic low back pain status post L4-L5 hemilaminectomy and discectomy with continued severe back pain, chronic diastolic CHF, anemia chronic disease with hemoglobin of 8-10 as well as other comorbidities who presented to the hospital with complaints of worsening low back pain.  Patient states that he was seen at [facility] physician and he was scheduled for an outpatient MRI but states that he had worsening back pain on the right side and became extremely weak.  He has been taking oxycodone 10 mg every 6 scheduled and noted that he continued to have significant pain.  He also states that he had 2 to 3-day period of nonproductive cough associated with some shortness of breath with no orthopnea, PND or worsening peripheral edema.  He ate new was noted to be hospitalized in May 2023 for AKI with mild hyperkalemia and at that time his serum creatinine was noted to be 7.43 and improved back to baseline of approximately 1.  Patient currently does not require any supplemental oxygen and only takes Breo and albuterol as needed given that he has a 75-year pack history of smoking.  Coming to the ED he was found to have some hypoxia and tachypnea and had to be placed on 4 L of supplemental oxygen via nasal cannula.  Labs showed an elevated BUN/creatinine with an AKI again.  CT of the chest abdomen pelvis dissection protocol was done to evaluate the patient's low blood pressure in the setting of his abdominal aortic aneurysm and showed a stable infrarenal abdominal aortic aneurysm status post endovascular  repair.  Also did show evidence of multifocal pulmonary infiltrates consistent with pneumonia and associated bronchial wall thickening suggestive of airway inflammation with no evidence of edema, pleural effusion or pneumothorax.  A CT of the chest abdomen pelvis also showed evidence of bilateral renal artery stenosis and moderate colonic diverticulosis without evidence of diverticulitis.  He was given IV fluid boluses and admitted for further management of his AKI with hyperkalemia as well as acute respiratory failure with hypoxia and hypotension in the setting of sepsis secondary to community-acquired pneumonia.   Assessment and Plan: No notes have been filed under this hospital service. Service: Hospitalist  Acute respiratory failure with hypoxia in the setting of community-acquired pneumonia Severe Sepsis 2/2 to CAP with associated Hypotension -Patient has had new onse cough with associated shortness of breath and CTA of the chest showed radiographic evidence of multilobular infiltrates consistent with pneumonia -Met severe Sepsis Criteria given that he had a significant AKI, New O2 Requirement and was Tachycardic and Tachypenic as a source of infection with the multifocal pneumonia -Patient has not been febrile or had any leukocytosis -Blood cultures x2 collected in the ED -Patient was noted to be saturating in the mid to high 80s on room air and had to be placed on supplemental oxygen with improvement of his saturations to the mid 90s -He was initiated on antibiotics with IV ceftriaxone and azithromycin -SpO2: 95 % O2 Flow Rate (L/min): 5 L/min FiO2 (%): 50 % -ABG    Component Value Date/Time   PHART 7.336 (L) 12/04/2020 1038   PCO2ART 52.1 (  H) 12/04/2020 1038   PO2ART 55 (L) 12/04/2020 1038   HCO3 23.1 08/18/2021 0548   TCO2 24 08/18/2021 0548   ACIDBASEDEF 1.0 08/18/2021 0548   O2SAT 99 08/18/2021 0548    -Continues to be on significant mount of oxygen and will continue to  treat -Continuous pulse oximetry maintain O2 saturation greater 90% -Continue supplemental oxygen via nasal cannula and wean O2 as tolerated -Patient will need an ambulatory home O2 screen as well as repeat chest x-ray prior to discharge -Given his long-term smoking he could have a component of COPD -Patient has been initiated on scheduled nebulizer treatments and as needed albuterol nebulizer and was also given Solu-Medrol -Given IV fluid hydration and fluid boluses -Check Streph Pnuemoniae Ur Ag and this was negative -Blood cultures x2 pending -MRSA PCR via NexGen is not detected -Procalcitonin level was elevated at 15.99 -We will need incentive spirometry, guaifenesin 1200 mg p.o. twice daily as well as a flutter valve -C/w IV Solumedrol 80 mg BID and start weaning   AKI -In the setting of Infection  -Patient's BUN/Cr went from 69/2.60 -> 59/1.78 -C/w IVF Hydration -Holding nephrotoxic medications including lisinopril Lasix -Avoid nephrotoxic medications, contrast dyes, hypotension and dehydration and will need to renally adjust medications -Continue To monitor and trend and repeat CMP in the AM  Hyperkalemia -Patient's K+ was 5.3 -In the setting of AK -Improving  -Continue to monitor and trend on repeat CMP in the the a.m.  Thrombocytopenia -Patient's platelet count went from 102 and is now 83 -Continue to monitor and trend and repeat CBC in the a.m.  Acute on chronic normocytic anemia -Patient's hemoglobin/hematocrit went from 7.5/22.0 -> 8.2/24.0 and repeat was 8.5/26.3 -Check anemia panel in the a.m. -FOBT is positive but he is not really having signs and symptoms of bleeding -Continue monitor signs and symptoms of bleeding; no overt bleeding noted -Need to discuss with gastroenterology -Repeat CBC in a.m.  Chronic diastolic CHF -Continue monitor strict I's and O's and daily weight -BNP was 632.7 -Continuing gentle IV fluid hydration for now -Repeat chest x-ray in  a.m.  Hyperlipidemia -Continue with Ezetimibe 10 mg p.o. daily and atorvastatin 80 mg p.o. daily  Depression and Anxiety -Continue with citalopram 20 mg p.o. daily  Hypomagnesemia -Patient's mag level is now 1.4 -Replete with IV mag sulfate 2 g -Continue to monitor and replete as necessary -Repeat mag level in a.m.  Chronic low back pain -Resumed his home oxycodone and tramadol -Also has IV fentanyl 50 mcg every 2 as needed for severe pain as well as Narcan if needed -Was post to be scheduled for outpatient MRI but may be able to be done in house if he continues to have significant back pain pending PT OT evaluations  GERD and GI prophylaxis -Continue with Pantoprazole 40 mg p.o. nightly  Aortic Aneurysm -CTA done and showed "Stable infrarenal abdominal aortic aneurysm status post endovascular repair. Stable aneurysm sac size at 5.4 cm. 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."  DVT prophylaxis: SCDs Start: 08/17/21 2356    Code Status: DNR Family Communication: Discussed with wife at bedside   Disposition Plan:  Level of care: Progressive Status is: Inpatient Remains inpatient appropriate because: Continues to have a new oxygen requirement and will need further clinical improvement as well as PT OT to evaluate prior to safe discharge disposition   Consultants:  None  Procedures:  CT angio chest abdomen pelvis  Antimicrobials:  Anti-infectives (From admission, onward)    Start  Dose/Rate Route Frequency Ordered Stop   08/18/21 0230  azithromycin (ZITHROMAX) 500 mg in sodium chloride 0.9 % 250 mL IVPB        500 mg 250 mL/hr over 60 Minutes Intravenous Daily at bedtime 08/18/21 0214     08/18/21 0230  cefTRIAXone (ROCEPHIN) 1 g in sodium chloride 0.9 % 100 mL IVPB        1 g 200 mL/hr over 30 Minutes Intravenous Daily at bedtime 08/18/21 0214         Subjective: Seen and examined at bedside and thinks he is doing a little bit better today.  Continues to wear supplemental  oxygen and states he does not wear any at home.  No nausea or vomiting.  States he continues have some low back pain but it is doing a little bit better today.  Denies any chest pain.  Objective: Vitals:   08/18/21 1352 08/18/21 1430 08/18/21 1532 08/18/21 1534  BP:  111/68  118/64  Pulse:  90    Resp:  18    Temp:   98.3 F (36.8 C)   TempSrc:   Oral   SpO2: 99% 95%  95%  Weight:    82.2 kg  Height:        Intake/Output Summary (Last 24 hours) at 08/18/2021 1724 Last data filed at 08/18/2021 1122 Gross per 24 hour  Intake 2275.15 ml  Output 650 ml  Net 1625.15 ml   Filed Weights   08/18/21 0835 08/18/21 1534  Weight: 75.5 kg 82.2 kg   Examination: Physical Exam:  Constitutional: WN/WD overweight chronically ill-appearing Caucasian male currently no acute distress but is wearing supplemental oxygen nasal cannula Respiratory: Diminished to auscultation bilaterally with coarse breath sounds and some rhonchi and some slight wheezing, no rales or crackles. Normal respiratory effort and patient is not tachypenic. No accessory muscle use.  Unlabored breathing but is wearing 5 L supplemental oxygen Cardiovascular: RRR, no murmurs / rubs / gallops. S1 and S2 auscultated.  Mild lower extremity Abdomen: Soft, non-tender, distended secondary body habitus. Bowel sounds positive.  GU: Deferred. Musculoskeletal: No clubbing / cyanosis of digits/nails. No joint deformity upper and lower extremities.  Skin: No rashes, lesions, ulcers. No induration; Warm and dry.  Neurologic: CN 2-12 grossly intact with no focal deficits. Romberg sign cerebellar reflexes not assessed.  Psychiatric: Normal judgment and insight. Alert and oriented x 3. Normal mood and appropriate affect.   Data Reviewed: I have personally reviewed following labs and imaging studies  CBC: Recent Labs  Lab 08/17/21 1811 08/17/21 1820 08/18/21 0518 08/18/21 0548  WBC 8.5  --  7.1  --   NEUTROABS 7.6  --  6.3  --   HGB  7.6* 7.5* 8.0* 8.2*  HCT 24.4* 22.0* 25.7* 24.0*  MCV 91.4  --  91.5  --   PLT 102*  --  83*  --    Basic Metabolic Panel: Recent Labs  Lab 08/17/21 1811 08/17/21 1820 08/18/21 0239 08/18/21 0518 08/18/21 0548  NA 134* 133* 134*  --  135  K 5.3* 5.3* 5.0  --  5.0  CL 104 103 104  --   --   CO2 22  --  22  --   --   GLUCOSE 94 82 88  --   --   BUN 63* 69* 59*  --   --   CREATININE 2.46* 2.60* 1.78*  --   --   CALCIUM 7.1*  --  7.5*  --   --  MG  --   --  1.3* 1.4*  --    GFR: Estimated Creatinine Clearance: 34.8 mL/min (A) (by C-G formula based on SCr of 1.78 mg/dL (H)). Liver Function Tests: Recent Labs  Lab 08/17/21 1811 08/18/21 0239  AST 23 30  ALT 15 17  ALKPHOS 71 91  BILITOT 0.8 0.6  PROT 4.4* 5.0*  ALBUMIN 2.3* 2.5*   No results for input(s): "LIPASE", "AMYLASE" in the last 168 hours. No results for input(s): "AMMONIA" in the last 168 hours. Coagulation Profile: No results for input(s): "INR", "PROTIME" in the last 168 hours. Cardiac Enzymes: No results for input(s): "CKTOTAL", "CKMB", "CKMBINDEX", "TROPONINI" in the last 168 hours. BNP (last 3 results) No results for input(s): "PROBNP" in the last 8760 hours. HbA1C: No results for input(s): "HGBA1C" in the last 72 hours. CBG: No results for input(s): "GLUCAP" in the last 168 hours. Lipid Profile: No results for input(s): "CHOL", "HDL", "LDLCALC", "TRIG", "CHOLHDL", "LDLDIRECT" in the last 72 hours. Thyroid Function Tests: No results for input(s): "TSH", "T4TOTAL", "FREET4", "T3FREE", "THYROIDAB" in the last 72 hours. Anemia Panel: Recent Labs    08/18/21 0518  RETICCTPCT 2.5   Sepsis Labs: Recent Labs  Lab 08/17/21 1811 08/17/21 2214 08/18/21 0239  PROCALCITON  --   --  15.99  LATICACIDVEN 1.7 0.8  --     Recent Results (from the past 240 hour(s))  Culture, blood (routine x 2)     Status: None (Preliminary result)   Collection Time: 08/17/21  7:41 PM   Specimen: BLOOD RIGHT FOREARM   Result Value Ref Range Status   Specimen Description BLOOD RIGHT FOREARM  Final   Special Requests   Final    BOTTLES DRAWN AEROBIC AND ANAEROBIC Blood Culture results may not be optimal due to an excessive volume of blood received in culture bottles   Culture   Final    NO GROWTH < 12 HOURS Performed at [facility], [place] [address], [name], [place] [address]    Report Status PENDING  Incomplete  Culture, blood (routine x 2)     Status: None (Preliminary result)   Collection Time: 08/17/21  7:52 PM   Specimen: BLOOD RIGHT HAND  Result Value Ref Range Status   Specimen Description BLOOD RIGHT HAND  Final   Special Requests   Final    BOTTLES DRAWN AEROBIC AND ANAEROBIC Blood Culture adequate volume   Culture   Final    NO GROWTH < 12 HOURS Performed at [facility], [place] [address] [place],  [place] [address]    Report Status PENDING  Incomplete  MRSA Next Gen by PCR, Nasal     Status: None   Collection Time: 08/18/21  3:18 PM   Specimen: Nasal Mucosa; Nasal Swab  Result Value Ref Range Status   MRSA by PCR Next Gen NOT DETECTED NOT DETECTED Final    Comment: (NOTE) The GeneXpert MRSA Assay (FDA approved for NASAL specimens only), is one component of a comprehensive MRSA colonization surveillance program. It is not intended to diagnose MRSA infection nor to guide or monitor treatment for MRSA infections. Test performance is not FDA approved in patients less than 51 years old. Performed at [facility], [place] [address], [place], [place] [address]      Radiology Studies: CT Angio Chest/Abd/Pel for Dissection W and/or Wo Contrast  Result Date: 08/17/2021 CLINICAL DATA:  Chest pain or back pain, aortic dissection suspected EXAM: CT ANGIOGRAPHY CHEST, ABDOMEN AND PELVIS  TECHNIQUE: Non-contrast CT of the chest was initially obtained. Multidetector CT imaging through the chest, abdomen and pelvis was performed using the standard protocol during bolus administration  of intravenous contrast. Multiplanar reconstructed images and MIPs were obtained and reviewed to evaluate the vascular anatomy. RADIATION DOSE REDUCTION: This exam was performed according to the departmental dose-optimization program which includes automated exposure control, adjustment of the mA and/or kV according to patient size and/or use of iterative reconstruction technique. CONTRAST:  63m OMNIPAQUE IOHEXOL 350 MG/ML SOLN COMPARISON:  CTA abdomen pelvis 03/24/2021, CT chest abdomen pelvis 12/03/2020 FINDINGS: CTA CHEST FINDINGS Cardiovascular: Extensive coronary artery calcification predominantly within the left anterior descending coronary artery. Transcatheter aortic valve replacement has been performed. Global cardiac size within normal limits. No pericardial effusion. Central pulmonary arteries are enlarged in keeping with changes of pulmonary arterial hypertension, stable since prior examination. Moderate atherosclerotic calcification is seen within the thoracic aorta. No aortic aneurysm, intramural hematoma, or dissection. Mediastinum/Nodes: Visualized thyroid is unremarkable. No pathologic thoracic adenopathy. The esophagus is unremarkable. Lungs/Pleura: Airspace infiltrate is seen throughout the right lung with focal consolidation within the superior segment of the right lower lobe, in keeping with acute infection in the appropriate clinical setting. Associated bronchial wall thickening in keeping with airway inflammation. No central obstructing mass. Small amount of layering debris noted within the right mainstem bronchus and bronchus intermedius. Minimal patchy infiltrate also noted within the left upper lobe. No pneumothorax or pleural effusion. Musculoskeletal: No acute bone abnormality. No lytic or blastic bone lesion. Review of the MIP images confirms the above findings. CTA ABDOMEN AND PELVIS FINDINGS VASCULAR Aorta: Fusiform infrarenal abdominal aortic aneurysm has undergone endovascular  repair utilizing a bifurcated stent graft with infrarenal proximal fixation and bilateral common iliac distal fixation. The aneurysm sac is unchanged in size when measured in identical fashion measuring 5.4 x 4.4 cm at axial image # 173/7 (measuring identically on image # 66/7 of previous examination). The repaired segment appears patent. Superimposed extensive atherosclerotic calcification. No periaortic inflammatory change or fluid collections are identified. Celiac: Less than 50% stenosis of the origin, unchanged. Distally widely patent. Variant anatomy with the celiac axis supplying only the left gastric artery and splenic artery. SMA: Replaced common hepatic artery. Less than 50% stenosis of the origin. Distally widely patent. No aneurysm or dissection. Renals: Dual renal arteries bilaterally with accessory lower pole renal arteries noted. 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 Normal vascular morphology. No aneurysm or dissection. 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 Distally widely patent. Inflow: No evidence of hemodynamically significant stenosis involving the lower extremity arterial inflow bilaterally. Internal iliac arteries are stenotic at their origins, but are patent bilaterally. Veins: No obvious venous abnormality within the limitations of this arterial phase study. Review of the MIP images confirms the above findings. NON-VASCULAR Hepatobiliary: No focal liver abnormality is seen. Status post cholecystectomy. No biliary dilatation. Pancreas: Unremarkable Spleen: Unremarkable Adrenals/Urinary Tract: The adrenal glands are unremarkable. The kidneys are normal. Bladder is unremarkable. Stomach/Bowel: Moderate descending and sigmoid colonic diverticulosis without superimposed acute inflammatory change. The  stomach, small bowel, and large bowel are otherwise unremarkable. Appendix normal. No free intraperitoneal gas or fluid. Lymphatic: No pathologic adenopathy within the abdomen and pelvis. Reproductive: Mild prostatic enlargement Other: No abdominal wall hernia Musculoskeletal: Left total hip arthroplasty and L4-5 hemilaminectomies and lumbar discectomy  with interbody bone cage placement has been performed. No definite bridging callus identified at L4-5. No acute bone abnormality. No lytic or blastic bone lesion. Review of the MIP images confirms the above findings. IMPRESSION: 1. No evidence of thoracoabdominal aortic dissection. 2. Extensive coronary artery calcification. Status post transcatheter aortic valve replacement. 3. Multifocal pulmonary infiltrate, most severe within the superior segment of the right lower lobe, in keeping with acute infection in the appropriate clinical setting. Associated bronchial wall thickening in keeping with airway inflammation. No central obstructing mass identified. 4. Stable changes of pulmonary arterial hypertension. 5. Stable infrarenal abdominal aortic aneurysm status post endovascular repair. Stable aneurysm sac size at 5.4 cm. 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 6. Bilateral renal artery hemodynamically significant stenoses, involving the main right renal artery and both the main as well as accessory lower polar left renal arteries. 7. Moderate distal colonic diverticulosis without superimposed acute inflammatory change. 8. Postsurgical changes of L4-5 hemilaminectomies and lumbar discectomy with interbody bone cage placement. No definite bridging callus identified at L4-5. Electronically Signed   By: Fidela Salisbury M.D.   On: 08/17/2021 23:13     Scheduled Meds:  atorvastatin  80 mg Oral Daily   citalopram  20 mg Oral Daily   ezetimibe  10 mg Oral Daily   ipratropium-albuterol  3 mL Nebulization Q6H   methylPREDNISolone (SOLU-MEDROL) injection  80 mg Intravenous BID    pantoprazole  40 mg Oral QHS   vitamin B-12  1,000 mcg Oral Daily   Continuous Infusions:  azithromycin Stopped (08/18/21 0332)   cefTRIAXone (ROCEPHIN)  IV Stopped (08/18/21 0302)   lactated ringers 100 mL/hr at 08/18/21 1201    LOS: 1 day   Raiford Noble, DO [facility] Available via Epic secure chat 7am-7pm After these hours, please refer to coverage provider listed on [email or web address] 08/18/2021, 5:24 PM

## 2021-08-18 NOTE — H&P (Signed)
History and Physical    PLEASE NOTE THAT DRAGON DICTATION SOFTWARE WAS USED IN THE CONSTRUCTION OF THIS NOTE.   Brandon [name] YTK:[id number] DOB: 1940-02-12 DOA: 08/17/2021  PCP: Haydee Salter, MD  Patient coming from: home   I have personally briefly reviewed patient's old medical records in [place]  Chief Complaint: Worsening low back pain  HPI: Brandon [name] is a 81 y.o. male with medical history significant for abdominal aortic aneurysm status post endovascular stent graft in January 2023, GERD, hypertension, hyperlipidemia, severe aortic stenosis status post TAVR, chronic low back pain status post L4-L5 hemilaminectomy and discectomy, chronic diastolic heart failure, anemia of chronic disease associated baseline hemoglobin 8-10, who is admitted to [facility] on 08/17/2021 with aki after presenting from home to [facility] complaining of low back pain.   The setting of chronic low back pain following L4-L5 hemilaminectomies and discectomy, the patient presents with complaint of worsening low back pain over the course the last day.  Denies any associated acute lower extremity weakness, saddle anesthesia, or new onset fecal incontinence or urinary retention.  No recent trauma.  For his chronic low back pain, the patient has been taking oxycodone 10 mg p.o. every 6 hours as needed, noting recent increase in frequency use of this medication, in addition to use of as needed tramadol.  He notes that this has been preceded by 2 to 3 days of new onset nonproductive cough associated with mild shortness of breath in the absence of orthopnea, PND, or worsening of peripheral edema.  He notes mild associated wheezing in the absence of hemoptysis.  Denies any recent chest pain, palpitations, nausea, vomiting, diaphoresis, presyncope, or syncope.  No recent abdominal discomfort, diarrhea, melena, hematochezia.  No hematemesis.  Of note, the patient was hospitalized in May 2023 for acute kidney  injury associated with mild hyperkalemia, with initial serum creatinine at that time noted to be 7.43 on 06/26/2021.  Renal function improved back to his baseline creatinine of approximately 1.0 with IV fluids and conservative measures, with most recent prior serum creatinine data point noted to be 0.98 on 07/07/2021.  Notable outpatient medications include lisinopril, Lasix, potassium chloride 20 mill equivalents p.o. twice daily.  He also has a history of anemia of chronic disease associated baseline hemoglobin range of 8-10, with most recent prior hemoglobin data point noted to be 10.1 on 06/28/2021.  Of note, at the time of presentation for his most recent prior hospitalization, his hemoglobin was noted to be 7.8 on 06/26/2021.  He is on a daily baby aspirin, but otherwise not on any blood thinners as an outpatient.  No known baseline supplemental oxygen requirements.  Has a long smoking history, noting that he completed with smoking in 2019 after preceding 75-pack-year of such.  Conveys that he takes his Spiriva and albuterol inhaler on a as needed basis only.  In the setting of history of chronic diastolic heart failure, most recent echocardiogram was performed on 07/13/2021 and was notable for LVEF 65 to 70%, no focal wall motion abnormalities, grade 1 diastolic dysfunction, normal right ventricular systolic function.      ED Course:  Vital signs in the ED were notable for the following: Afebrile; heart rate 70-87; initial blood pressure 76/58, subsequent proved to 99/61 following interval IV fluids, as quantified below; respiratory rate 18-26; oxygen saturation 86 to 89% on room air, subsequent improving into the range of 93 to 94% on 4 L nasal cannula.  Labs were  notable for the following: CMP notable for the following: Potassium 5.3, preoperative 22, BUN 63 compared to most recent  33 on 07/07/2021, creatinine 2.46, glucose 94, calcium, adjusted for mild hypoalbuminemia noted to be 8.4; noted to be  3, otherwise liver enzymes found to be within normal limits.  Lactic acid 1.7.  CBC notable for white cell count 8500, hemoglobin 76 assisted with normocytic/normochromic findings.  DRE performed by EDP was associated with brown appearing stool that was fecal occult blood positive.  Blood cultures x2 collected in the ED prior to initiation of any IV antibiotics.  Imaging and additional notable ED work-up: CTA chest, abdomen, pelvis with dissection protocol to evaluate the patient's presenting low blood pressure in the context of a history of abdominal aortic aneurysm demonstrated no evidence of  Abdominal aortic dissection, while showing stable infrarenal abdominal aortic aneurysm status post endovascular repair, while also showing evidence of multifocal pulmonary infiltrates consistent with pneumonia and associated bronchial wall thickening suggestive of airway inflammation, while showing no evidence of edema, pleural effusion, or pneumothorax; CTA chest, abdomen, pelvis also showed evidence of bilateral renal artery stenosis and moderate distal colonic diverticulosis without evidence of diverticulitis, abscess, obstruction, or perforation.  While in the ED, the following were administered: Normal saline x500 cc bolus x2 doses.  Subsequently, the patient was admitted for further evaluation management of acute kidney injury associated with hyperkalemia, with presentation also notable for community-acquired pneumonia associated with acute hypoxic respiratory failure after presenting with hypotension with presenting labs also notable for acute on chronic anemia.    Review of Systems: As per HPI otherwise 10 point review of systems negative.   Past Medical History:  Diagnosis Date   Abdominal aneurysm ([place])    Arthritis    "all over" (07/19/2016)   BPH (benign prostatic hypertrophy)    CAD (coronary artery disease)    Chicken pox    Chronic lower back pain    s/p surgical fusion   Depression     Diverticulosis    Esophageal stricture    GERD (gastroesophageal reflux disease)    Hepatitis B 1984   Hiatal hernia    History of radiation therapy 01/16/18- 03/05/18   Left Tonsil, 66 Gy in 33 fractions to high risk nodal echelons.    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 Burt Knack and Dr Cyndia Bent    Severe aortic stenosis    TIA (transient ischemic attack) 1990s   hx   tonsillar ca dx'd 11/2017   Tubular adenoma of colon 2009    Past Surgical History:  Procedure Laterality Date   ABDOMINAL AORTIC ENDOVASCULAR STENT GRAFT N/A 03/03/2021   Procedure: ABDOMINAL AORTIC ENDOVASCULAR STENT GRAFT;  Surgeon: Waynetta Sandy, MD;  Location: [place];  Service: Vascular;  Laterality: N/A;   ABDOMINAL AORTOGRAM W/LOWER EXTREMITY N/A 04/10/2021   Procedure: ABDOMINAL AORTOGRAM W/LOWER EXTREMITY;  Surgeon: Waynetta Sandy, MD;  Location: [place] [facility];  Service: Cardiovascular;  Laterality: N/A;   BACK SURGERY     CARDIAC CATHETERIZATION     CATARACT EXTRACTION W/ INTRAOCULAR LENS  IMPLANT, BILATERAL Bilateral 01/2012 - 02/2012   CORONARY ATHERECTOMY N/A 04/27/2020   Procedure: CORONARY ATHERECTOMY;  Surgeon: Sherren Mocha, MD;  Location: [place] [facility];  Service: Cardiovascular;  Laterality: N/A;  CORONARY STENT INTERVENTION N/A 04/27/2020   Procedure: CORONARY STENT INTERVENTION;  Surgeon: Sherren Mocha, MD;  Location: [place] [facility];  Service: Cardiovascular;  Laterality: N/A;   DIRECT LARYNGOSCOPY Left 10/09/2017   Procedure: DIRECT LARYNGOSCOPY WITH BOPSY;  Surgeon: Jodi Marble, MD;  Location: [place];  Service: ENT;  Laterality: Left;   ESOPHAGOGASTRODUODENOSCOPY (EGD) WITH ESOPHAGEAL DILATION     "couple times" (07/19/2016)   ESOPHAGOSCOPY Left  10/09/2017   Procedure: ESOPHAGOSCOPY;  Surgeon: Jodi Marble, MD;  Location: [place];  Service: ENT;  Laterality: Left;   INTRAVASCULAR IMAGING/OCT N/A 04/27/2020   Procedure: INTRAVASCULAR IMAGING/OCT;  Surgeon: Sherren Mocha, MD;  Location: [place] [facility];  Service: Cardiovascular;  Laterality: N/A;   INTRAVASCULAR PRESSURE WIRE/FFR STUDY N/A 04/27/2020   Procedure: INTRAVASCULAR PRESSURE WIRE/FFR STUDY;  Surgeon: Sherren Mocha, MD;  Location: [place] [facility];  Service: Cardiovascular;  Laterality: N/A;   IR GASTROSTOMY TUBE MOD SED  01/08/2018   IR THORACENTESIS ASP PLEURAL SPACE W/IMG GUIDE  07/19/2016   LAPAROSCOPIC CHOLECYSTECTOMY  1984   LUMBAR [place] SURGERY  05/1996   L4-5; Dr. Claudean Kinds LAMINECTOMY/DECOMPRESSION MICRODISCECTOMY  10/2002   L3-4. Dr. Sherwood Gambler   MULTIPLE TOOTH EXTRACTIONS  1980s   PARTIAL GLOSSECTOMY  12/02/2017   Dr. Nicolette Bang- [facility]   pharyngoplasty for closure of tingue base defect  12/02/2017   Dr. Nicolette Bang- [place]  07/15/2016   POSTERIOR LUMBAR FUSION  09/1996   Ray cage, L4-5 Dr. Rita Ohara   PROSTATE BIOPSY  ~ 2017   radical tonsillectomy Left 12/02/2017   Dr. Nicolette Bang at [place] N/A 03/31/2020   Procedure: RIGHT HEART CATH AND CORONARY ANGIOGRAPHY;  Surgeon: Larey Dresser, MD;  Location: [place] [facility];  Service: Cardiovascular;  Laterality: N/A;   RIGID BRONCHOSCOPY Left 10/09/2017   Procedure: RIGID BRONCHOSCOPY;  Surgeon: Jodi Marble, MD;  Location: [place];  Service: ENT;  Laterality: Left;   TEE WITHOUT CARDIOVERSION N/A 06/07/2020   Procedure: TRANSESOPHAGEAL ECHOCARDIOGRAM (TEE);  Surgeon: Sherren Mocha, MD;  Location: [place];  Service: Open Heart Surgery;  Laterality: N/A;   TONSILLECTOMY     TOTAL HIP ARTHROPLASTY Left 12/04/2020   Procedure: TOTAL HIP ARTHROPLASTY ANTERIOR APPROACH;  Surgeon: Rod Can, MD;   Location: [facility];  Service: Orthopedics;  Laterality: Left;   TOTAL HIP ARTHROPLASTY Left 11/2020   TRACHEOSTOMY  12/02/2017   Dr. [facility]icolette [facility]- [facility]   ULTRASOUND GUIDANCE FOR VASCULAR ACCESS Right 06/07/2020   Procedure: ULTRASOUND GUIDANCE FOR VASCULAR ACCESS;  Surgeon: Sherren Mocha, MD;  Location: [place];  Service: Open Heart Surgery;  Laterality: Right;   ULTRASOUND GUIDANCE FOR VASCULAR ACCESS Bilateral 03/03/2021   Procedure: ULTRASOUND GUIDANCE FOR VASCULAR ACCESS, BILATERAL FEMORAL ARTERIES;  Surgeon: Waynetta Sandy, MD;  Location: [place];  Service: Vascular;  Laterality: Bilateral;   VASCULAR SURGERY      Social History:  reports that he quit smoking about 3 years ago. His smoking use included cigarettes. He has a 75.00 pack-year smoking history. He has never used smokeless tobacco. He reports that he does not currently use alcohol. He reports current drug use. Drug: Oxycodone.   Allergies  Allergen Reactions   Celebrex [Celecoxib] Hives and Itching   Tape     Tears skin. Prefers paper tape Please    Family History  Problem Relation Age of Onset   Heart disease Father 73  Living   Coronary artery disease Father        CABG   Alzheimer's disease Mother 98       Deceased   Arthritis Mother    Aneurysm Brother    Stomach cancer Maternal Uncle    Brain cancer Maternal Aunt        x2   Obesity Daughter        Had Bypass Sx    Family history reviewed and not pertinent    Prior to Admission medications   Medication Sig Start Date End Date Taking? Authorizing Provider  acetaminophen (TYLENOL) 500 MG tablet Take 1,000 mg by mouth every 8 (eight) hours as needed for mild pain.    [provider]  albuterol (VENTOLIN HFA) 108 (90 Base) MCG/ACT inhaler Inhale 2 puffs into the lungs every 6 (six) hours as needed for wheezing or shortness of breath. 01/24/21   Haydee Salter, MD  alendronate (FOSAMAX) 70 MG tablet Take 1 tablet (70 mg total) by mouth  once a week. Take with a full glass of water on an empty stomach. Patient taking differently: Take 70 mg by mouth every Tuesday. Take with a full glass of water on an empty stomach. 01/26/21   Bo Merino, MD  allopurinol (ZYLOPRIM) 100 MG tablet TAKE 2 TABLETS(200 MG) BY MOUTH DAILY 07/12/21   Ofilia Neas, PA-C  amLODipine (NORVASC) 10 MG tablet TAKE 1 TABLET(10 MG) BY MOUTH DAILY Patient taking differently: Take 10 mg by mouth daily. 06/26/21   Larey Dresser, MD  aspirin EC 81 MG tablet Take 81 mg by mouth daily. Swallow whole.    [provider]  atorvastatin (LIPITOR) 80 MG tablet Take 1 tablet (80 mg total) by mouth daily. 03/21/21   Larey Dresser, MD  carvedilol (COREG) 12.5 MG tablet Take 1 tablet (12.5 mg total) by mouth 2 (two) times daily. 03/24/21 03/24/22  Larey Dresser, MD  Cholecalciferol 25 MCG (1000 UT) tablet Take 1,000 Units by mouth daily.    [provider]  citalopram (CELEXA) 20 MG tablet TAKE 1 TABLET(20 MG) BY MOUTH DAILY Patient taking differently: Take 20 mg by mouth daily. 01/20/21   Lovorn, Jinny Blossom, MD  doxycycline (VIBRA-TABS) 100 MG tablet Take 1 tablet (100 mg total) by mouth 2 (two) times daily. 07/18/21   Haydee Salter, MD  ezetimibe (ZETIA) 10 MG tablet Take 1 tablet (10 mg total) by mouth daily. 05/25/21 05/25/22  Larey Dresser, MD  fluticasone (FLONASE) 50 MCG/ACT nasal spray SHAKE LIQUID AND USE 2 SPRAYS IN EACH NOSTRIL DAILY AS NEEDED FOR RHINITIS OR ALLERGIES Patient taking differently: Place 2 sprays into both nostrils daily as needed for rhinitis or allergies. 11/14/20   Haydee Salter, MD  furosemide (LASIX) 20 MG tablet Take 20 mg by mouth daily.    [provider]  gabapentin (NEURONTIN) 300 MG capsule Take 300 mg by mouth 3 (three) times daily. 07/04/21   [provider]  hydrOXYzine (VISTARIL) 25 MG capsule Take 1 capsule (25 mg total) by mouth every 8 (eight) hours as needed. 06/23/21   Haydee Salter, MD   ipratropium (ATROVENT) 0.03 % nasal spray Place 2 sprays into both nostrils every 12 (twelve) hours. Patient taking differently: Place 2 sprays into both nostrils 2 (two) times daily as needed for rhinitis (allergies). 01/24/21   Haydee Salter, MD  lisinopril (ZESTRIL) 20 MG tablet Take 20 mg by mouth daily.    [provider]  ondansetron (ZOFRAN-ODT) 4 MG disintegrating tablet Take 1 tablet (4 mg total) by mouth every 8 (eight) hours as needed for nausea or vomiting. 06/20/21   Haydee Salter, MD  Oxycodone HCl 10 MG TABS Take 1 tablet (10 mg total) by mouth every 6 (six) hours as needed. 08/02/21   Lovorn, Jinny Blossom, MD  pantoprazole (PROTONIX) 40 MG tablet Take 1 tablet (40 mg total) by mouth daily. 06/30/21 09/28/21  [place] Donnamarie Poag, DO  potassium chloride (KLOR-CON) 10 MEQ tablet Take 2 tablets (20 mEq total) by mouth 2 (two) times daily. 05/01/21   [place], Maricela Bo, FNP  predniSONE (DELTASONE) 1 MG tablet TAKE 2 TABLET BY MOUTH DAILY ALONG WITH 5 MG FOR A TOTAL OF 7 MG DAILY FOR 2 MONTHS THEN REDUCE TO 6 MG DAILY 08/02/21   Bo Merino, MD  predniSONE (DELTASONE) 20 MG tablet Take 2 tablets (40 mg total) by mouth daily with breakfast. 07/18/21   Haydee Salter, MD  predniSONE (DELTASONE) 5 MG tablet Take 1 tablet (5 mg total) by mouth daily with breakfast. 05/18/21   Bo Merino, MD  tamsulosin (FLOMAX) 0.4 MG CAPS capsule Take 0.4 mg by mouth in the morning and at bedtime. 12/23/18   [provider]  thiamine 100 MG tablet Take 1 tablet (100 mg total) by mouth daily. 06/30/21 09/28/21  [place] Donnamarie Poag, DO  Tiotropium Bromide Monohydrate (SPIRIVA RESPIMAT) 2.5 MCG/ACT AERS Inhale 2 puffs into the lungs daily. Patient taking differently: Inhale 2 puffs into the lungs daily as needed (shortness of breath). 01/24/21   Haydee Salter, MD  traMADol (ULTRAM) 50 MG tablet Take 2 tablets (100 mg total) by mouth 2 (two) times daily. Patient taking differently: Take 100 mg by  mouth every 12 (twelve) hours as needed for moderate pain. 06/02/21   Lovorn, Jinny Blossom, MD  vitamin B-12 (CYANOCOBALAMIN) 1000 MCG tablet Take 1 tablet (1,000 mcg total) by mouth daily. 06/29/21 09/27/21  [place] Eric J, DO     Objective    Physical Exam: Vitals:   08/18/21 0110 08/18/21 0115 08/18/21 0130 08/18/21 0145  BP:  99/61 (!) 85/58 96/62  Pulse: 88 83 83 82  Resp: (!) 22 (!) 26 (!) 22 (!) 22  SpO2: 93% 90% 91% 94%    General: appears to be stated age; alert, oriented Skin: warm, dry, no rash Head:  AT/[place] Mouth:  Oral mucosa membranes appear dry, normal dentition Neck: supple; trachea midline Heart:  RRR; did not appreciate any M/R/G Lungs: CTAB, did not appreciate any wheezes, rales, or rhonchi Abdomen: + BS; soft, ND, NT Vascular: 2+ pedal pulses b/l; 2+ radial pulses b/l Extremities: no peripheral edema, no muscle wasting Neuro: strength and sensation intact in upper and lower extremities b/l    Labs on Admission: I have personally reviewed following labs and imaging studies  CBC: Recent Labs  Lab 08/17/21 1811 08/17/21 1820  WBC 8.5  --   NEUTROABS 7.6  --   HGB 7.6* 7.5*  HCT 24.4* 22.0*  MCV 91.4  --   PLT 102*  --    Basic Metabolic Panel: Recent Labs  Lab 08/17/21 1811 08/17/21 1820  NA 134* 133*  K 5.3* 5.3*  CL 104 103  CO2 22  --   GLUCOSE 94 82  BUN 63* 69*  CREATININE 2.46* 2.60*  CALCIUM 7.1*  --    GFR: CrCl cannot be calculated (Unknown ideal weight.). Liver Function Tests: Recent Labs  Lab 08/17/21 1811  AST 23  ALT 15  ALKPHOS 71  BILITOT 0.8  PROT 4.4*  ALBUMIN 2.3*   No results for input(s): "LIPASE", "AMYLASE" in the last 168 hours. No results for input(s): "AMMONIA" in the last 168 hours. Coagulation Profile: No results for input(s): "INR", "PROTIME" in the last 168 hours. Cardiac Enzymes: No results for input(s): "CKTOTAL", "CKMB", "CKMBINDEX", "TROPONINI" in the last 168 hours. BNP (last 3 results) No results  for input(s): "PROBNP" in the last 8760 hours. HbA1C: No results for input(s): "HGBA1C" in the last 72 hours. CBG: No results for input(s): "GLUCAP" in the last 168 hours. Lipid Profile: No results for input(s): "CHOL", "HDL", "LDLCALC", "TRIG", "CHOLHDL", "LDLDIRECT" in the last 72 hours. Thyroid Function Tests: No results for input(s): "TSH", "T4TOTAL", "FREET4", "T3FREE", "THYROIDAB" in the last 72 hours. Anemia Panel: No results for input(s): "VITAMINB12", "FOLATE", "FERRITIN", "TIBC", "IRON", "RETICCTPCT" in the last 72 hours. Urine analysis:    Component Value Date/Time   COLORURINE YELLOW 06/26/2021 0904   APPEARANCEUR CLEAR 06/26/2021 0904   LABSPEC 1.011 06/26/2021 0904   PHURINE 5.0 06/26/2021 0904   GLUCOSEU NEGATIVE 06/26/2021 0904   GLUCOSEU NEGATIVE 09/29/2019 1125   HGBUR NEGATIVE 06/26/2021 0904   BILIRUBINUR NEGATIVE 06/26/2021 0904   BILIRUBINUR negative 08/01/2016 [place] 06/26/2021 0904   PROTEINUR NEGATIVE 06/26/2021 0904   UROBILINOGEN 0.2 09/29/2019 1125   NITRITE NEGATIVE 06/26/2021 0904   LEUKOCYTESUR NEGATIVE 06/26/2021 0904    Radiological Exams on Admission: CT Angio Chest/Abd/Pel for Dissection W and/or Wo Contrast  Result Date: 08/17/2021 CLINICAL DATA:  Chest pain or back pain, aortic dissection suspected EXAM: CT ANGIOGRAPHY CHEST, ABDOMEN AND PELVIS TECHNIQUE: Non-contrast CT of the chest was initially obtained. Multidetector CT imaging through the chest, abdomen and pelvis was performed using the standard protocol during bolus administration of intravenous contrast. Multiplanar reconstructed images and MIPs were obtained and reviewed to evaluate the vascular anatomy. RADIATION DOSE REDUCTION: This exam was performed according to the departmental dose-optimization program which includes automated exposure control, adjustment of the mA and/or kV according to patient size and/or use of iterative reconstruction technique. CONTRAST:   76m OMNIPAQUE IOHEXOL 350 MG/ML SOLN COMPARISON:  CTA abdomen pelvis 03/24/2021, CT chest abdomen pelvis 12/03/2020 FINDINGS: CTA CHEST FINDINGS Cardiovascular: Extensive coronary artery calcification predominantly within the left anterior descending coronary artery. Transcatheter aortic valve replacement has been performed. Global cardiac size within normal limits. No pericardial effusion. Central pulmonary arteries are enlarged in keeping with changes of pulmonary arterial hypertension, stable since prior examination. Moderate atherosclerotic calcification is seen within the thoracic aorta. No aortic aneurysm, intramural hematoma, or dissection. Mediastinum/Nodes: Visualized thyroid is unremarkable. No pathologic thoracic adenopathy. The esophagus is unremarkable. Lungs/Pleura: Airspace infiltrate is seen throughout the right lung with focal consolidation within the superior segment of the right lower lobe, in keeping with acute infection in the appropriate clinical setting. Associated bronchial wall thickening in keeping with airway inflammation. No central obstructing mass. Small amount of layering debris noted within the right mainstem bronchus and bronchus intermedius. Minimal patchy infiltrate also noted within the left upper lobe. No pneumothorax or pleural effusion. Musculoskeletal: No acute bone abnormality. No lytic or blastic bone lesion. Review of the MIP images confirms the above findings. CTA ABDOMEN AND PELVIS FINDINGS VASCULAR Aorta: Fusiform infrarenal abdominal aortic aneurysm has undergone endovascular repair utilizing a bifurcated stent graft with infrarenal proximal fixation and bilateral common iliac distal fixation. The aneurysm sac is unchanged in size when measured in identical fashion measuring 5.4 x 4.4  cm at axial image # 173/7 (measuring identically on image # 66/7 of previous examination). The repaired segment appears patent. Superimposed extensive atherosclerotic calcification. No  periaortic inflammatory change or fluid collections are identified. Celiac: Less than 50% stenosis of the origin, unchanged. Distally widely patent. Variant anatomy with the celiac axis supplying only the left gastric artery and splenic artery. SMA: Replaced common hepatic artery. Less than 50% stenosis of the origin. Distally widely patent. No aneurysm or dissection. Renals: Dual renal arteries bilaterally with accessory lower pole renal arteries noted. 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 Normal vascular morphology. No aneurysm or dissection. 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 Distally widely patent. Inflow: No evidence of hemodynamically significant stenosis involving the lower extremity arterial inflow bilaterally. Internal iliac arteries are stenotic at their origins, but are patent bilaterally. Veins: No obvious venous abnormality within the limitations of this arterial phase study. Review of the MIP images confirms the above findings. NON-VASCULAR Hepatobiliary: No focal liver abnormality is seen. Status post cholecystectomy. No biliary dilatation. Pancreas: Unremarkable Spleen: Unremarkable Adrenals/Urinary Tract: The adrenal glands are unremarkable. The kidneys are normal. Bladder is unremarkable. Stomach/Bowel: Moderate descending and sigmoid colonic diverticulosis without superimposed acute inflammatory change. The stomach, small bowel, and large bowel are otherwise unremarkable. Appendix normal. No free intraperitoneal gas or fluid. Lymphatic: No pathologic adenopathy within the abdomen and pelvis. Reproductive: Mild prostatic enlargement Other: No abdominal wall hernia Musculoskeletal: Left total hip arthroplasty and L4-5 hemilaminectomies and lumbar discectomy with interbody bone cage placement has been  performed. No definite bridging callus identified at L4-5. No acute bone abnormality. No lytic or blastic bone lesion. Review of the MIP images confirms the above findings. IMPRESSION: 1. No evidence of thoracoabdominal aortic dissection. 2. Extensive coronary artery calcification. Status post transcatheter aortic valve replacement. 3. Multifocal pulmonary infiltrate, most severe within the superior segment of the right lower lobe, in keeping with acute infection in the appropriate clinical setting. Associated bronchial wall thickening in keeping with airway inflammation. No central obstructing mass identified. 4. Stable changes of pulmonary arterial hypertension. 5. Stable infrarenal abdominal aortic aneurysm status post endovascular repair. Stable aneurysm sac size at 5.4 cm. 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 6. Bilateral renal artery hemodynamically significant stenoses, involving the main right renal artery and both the main as well as accessory lower polar left renal arteries. 7. Moderate distal colonic diverticulosis without superimposed acute inflammatory change. 8. Postsurgical changes of L4-5 hemilaminectomies and lumbar discectomy with interbody bone cage placement. No definite bridging callus identified at L4-5. Electronically Signed   By: Fidela Salisbury M.D.   On: 08/17/2021 23:13      Assessment/Plan   Principal Problem:   AKI (acute kidney injury) ([place]) Active Problems:   Acute on chronic anemia   Acute respiratory failure with hypoxia (HCC)   Hyperkalemia   Hyperlipidemia   Gastroesophageal reflux disease   Depression   Chronic diastolic heart failure ([place])   CAP (community acquired pneumonia)   Hypotension      #) Acute kidney injury: Presenting serum creatinine 2.46 relative to baseline creatinine approximately 1.0.  Appears prerenal in nature, potentially multifactorial in nature, with associated acute prerenal azotemia, with likely contributions from mild dehydration, as well as  potential nephrotoxic implications of oxycodone along with its toxic metabolites, with increased circulating concentration of such in the setting of  the patient's AKI, with potential additional pharmacologic contributions that include lisinopril use superimposed on bilateral renal artery stenosis, with indications of dehydration exacerbated via use of outpatient Lasix.  Additional potential contributing factors include presenting acute [place] respiratory distress resulting in diminished oxygen delivery capacity as of additional potential prerenal contribution to his AKI as well as potential contribution from presenting systemic hypotension.  History of BPH is also noted.  Of note, patient has similar, but more pronounced presentation in 12-Jul-2021, with presenting creatinine at that time of 7.43, which improved with conservative measures/IV fluids, with renal function subsequently returning to baseline, as further detailed above.    Plan: Gentle continuous lactated Ringer's.  Monitor strict I's and O's and daily weights.  Attempt to avoid nephrotoxic agents.  Refrain from NSAIDs.  Hold home Lasix, lisinopril, gabapentin, oxycodone.  Check urinalysis with microscopy.  Add on random urine sodium as well as random urine creatinine.  Repeat CMP in the morning.  Further evaluation management of presenting acute hypoxic respiratory failure, as below.         #) Hyperkalemia: Presenting serum potassium level mildly elevated at 5.3, with multifactorial contributions, including that from acute kidney injury, particular in setting of bilateral renal artery stenosis, with pharmacologic exacerbation from lisinopril, Coreg in addition to outpatient potassium supplementation in the form potassium chloride 20 mill equivalents p.o. twice daily.  Will check EKG to further assess for associated changes.  Given very mild elevation in the patient's her potassium level, will refrain from shifting agents at this time, but  rather focus on gentle IV fluids to attend to element of dehydration as well as engage in further evaluation management of contributory AKI, with reevaluation of serum potassium level per a.m. labs to guide need for additional medical management.    Plan: Continuous LR, as above.  Repeat CMP in the morning.  Add on serum magnesium level.  Hold outpatient lisinopril, Coreg, Lasix, oral potassium supplementation.  Check EKG.  Monitor on telemetry.         #) Community acquired pneumonia: Diagnosis on the basis of 2 to 3 days of new onset cough associate with shortness of breath, with CTA chest showing evidence of multilobular infiltrates consistent with pneumonia.  In the absence of any associated fever or leukocytosis, criteria for sepsis not currently met.  Lactate nonelevated 1.7.  Blood cultures x2 collected in the ED today.  Plan: Follow-up results of blood cultures x2.  Start Rocephin and azithromycin.  Add on procalcitonin level.  Check strep pneumoniae urine antigen.  Incentive spirometry ordered.  Pete CBC with differential in the morning.         #) Acute hypoxic respiratory failure: In the context of no known supplemental oxygen requirements at baseline, presenting oxygen saturation was noted to be in the mid to high 80s on room air, subsequent proving into the mid 90s on 4 L nasal cannula.  There appears to be a contribution from community-acquired pneumonia, in setting of multiday focal infiltrates observed on CTA, consistent with pneumonia.  Additionally, will the patient denies any formal diagnosis of underlying COPD, there is potential for contribution towards the patient's acute hypoxic respiratory failure from a component of acute COPD exacerbation, while CTA chest also shows evidence of bronchial wall thickening with evidence of airway inflammation.  In tandem with recent development of wheezing in the absence of any evidence of underlying heart failure, will also initiate  solumedrol at this time.  CTA shows no evidence of dissection, pneumothorax.  Additionally, this is felt to be less likely in the absence of any recent chest pain.  Check an EKG to further assess.  Plan: Further evaluation management to present to be a current value, including IV antibiotics, as above.  Monitor continuous pulse oximetry.  Add on serum magnesium level.  Check EKG.  Check VBG.  Solumedrol.  Scheduled nebulizer treatments.  Prn albuterol nebulizer.  Add on BNP.  Monitor strict I's and O's and daily weights.         #) Hypotension: In the context of documented history of hypertension, the patient's initial blood pressure noted to be 76/58, subsequently improving to 99/61 following interval administration of IV fluids, suggestive of an element of dehydration, which may have also been exacerbated by increasing use of oxycodone.  Potential infectious contribution in the setting of suspected community-acquired pneumonia.  At baseline, patient is on multiple antihypertensive medications at home, as outlined above.  Presentation is associated with acute on chronic anemia, with Hemoccult positive finding.  However, this appears more consistent with worsening anemia due to AKI, with very similar presenting hemoglobin relative to that at the time of his admission on 06/26/2021.  There may be a component of slow lower GI bleed from a diverticular source given concomitant evidence of diverticulosis without diverticulitis on today CT imaging.  will closely trend in setting hemoglobin values.  will also add on cortisol level given the patient's recent systemic corticosteroid use as an outpatient.  ACS less likely, as described above, with EKG pending at this time.  No evidence of aortic dissection.   Plan: Gentle IV fluids, as above.  Monitor strict I's and O's and daily weights.  Further evaluation management to suspected community-acquired pneumonia, as above.  Add on random cortisol level.   Repeat CMP and CBC in the morning.  Check urinalysis.  Check EKG.  Monitor on telemetry.  On serum magnesium level.  Further evaluation management of acute on chronic anemia, as further detailed below.  Holding home prn oxycodone for now.  Holding home antihypertensive medications for now, including tamsulosin.         #) Acute on chronic anemia: In the setting of a documented history of anemia of chronic disease, with baseline hemoglobin range 8-10, presenting hemoglobin slightly lower than his baseline range, with presenting value of 7.6 associate with normocytic/normochromic findings.   Suspect contribution from interval worsening of renal function, in similar fashion to that of May '23 presentation.  There is potential for a less influential contribution from acute blood loss via mild lower gastrointestinal bleed given Hemoccult positive finding on DRE today, which was also associated with brown appearing stool.  Suspect that this would be on the basis of a diverticular bleed, as above.  While BUN is elevated, it is proportionate in its elevation to interval increase of creatinine in the setting of presenting AKI.  Overall, acute upper GI bleed is felt to be less likely.  We will plan to trend hemoglobin closely, particular in response to ensuing renal function, with potential for outpatient GI referral.  Of note, he is on a daily baby aspirin at home, otherwise on no blood thinners.   Plan: Add on iron studies, INR, reticulocyte count.  Hold home daily baby aspirin for now.  SCDs.  Repeat CBC in the morning.  Repeat H&H at 9 AM on 08/18/2021.  Monitor on telemetry.  Type and screen ordered.  Further evaluation management of acute kidney injury, as above.            #)  GERD: documented h/o such; on Protonix as outpatient.   Plan: continue home PPI.             #) Hyperlipidemia: documented h/o such. On high intensity atorvastatin as outpatient.    Plan: continue home  statin.              #) Chronic diastolic heart failure: documented history of such, with most recent echocardiogram performed In June 2023 notable for grade 1 diastolic dysfunction, with additional details as conveyed above . No clinical Or radiographic evidence to suggest acutely decompensated heart failure at this time.  Rather, presentation appears more clinically consistent with mild dehydration, as above.  Consequently, will hold home Lasix for now.  Plan: monitor strict I's & O's and daily weights. Repeat BMP in AM. Check serum mag level.  Hold home Lasix for now.  EKG ordered.  Add on BNP.          #) Depression: Documented history of such, on Celexa as an outpatient.  Plan: Resume home Celexa.  We will follow for results of EKG, including monitoring for result of QTc interval.        DVT prophylaxis: SCD's   Code Status: DNR/DNI (form on file, and confirmed via my discussions with the patient). Family Communication: none Disposition Plan: Per Rounding Team Consults called: none;  Admission status: Inpatient    PLEASE NOTE THAT DRAGON DICTATION SOFTWARE WAS USED IN THE CONSTRUCTION OF THIS NOTE.   [place] DO [facility]  From [place]   08/18/2021, 2:25 AM

## 2021-08-18 NOTE — Plan of Care (Signed)

## 2021-08-18 NOTE — TOC Progression Note (Signed)
Transition of Care [facility]) - Progression Note    Patient Details  Name: HAYZE GAZDA MRN: [id number] Date of Birth: [birth year]  Transition of Care [facility]) CM/SW [place], RN Phone Number:[phone]  08/18/2021, 3:37 PM  Clinical Narrative:     Transition of Care (TOC) Screening Note   Patient Details  Name: MARCOS RUELAS Date of Birth: 01-06-1941   Transition of Care [facility]) CM/SW Contact:    Angelita Ingles, RN Phone Number: 08/18/2021, 3:38 PM    Transition of Care Department [facility]) has reviewed patient and no TOC needs have been identified at this time. We will continue to monitor patient advancement through interdisciplinary progression rounds.           Expected Discharge Plan and Services                                                 Social Determinants of Health (SDOH) Interventions    Readmission Risk Interventions     No data to display

## 2021-08-18 NOTE — ED Notes (Signed)
RT called for patient oxygen levels dropping to the 80s while sleeping. RT recommended to place patient on a stronger supplement of oxygen.

## 2021-08-18 NOTE — ED Notes (Signed)
After breathing tx finished, patient had to use the urinal. Patient did not want to wait for RN to assist, stood up quickly and removed oxygen bc it was uncomfortable and could not see per pt. RN explained to patient the importance of keeping oxygen on at all time.  Patient verbalized understanding after finished urinating.

## 2021-08-18 NOTE — ED Notes (Signed)
ED TO INPATIENT HANDOFF REPORT  ED Nurse Name and Phone #: Raquel Sarna RN [phone]  S Name/Age/Gender Brandon [name] 81 y.o. male Room/Bed: 018C/018C  Code Status   Code Status: DNR  Home/SNF/Other Home Patient oriented to: self, place, time, and situation Is this baseline? Yes   Triage Complete: Triage complete  Chief Complaint AKI (acute kidney injury) ([place]) [N17.9]  Triage Note Pt BIB [facility] from home d/t Rt lower back pain (reports he has a lumbar fusion in the past) 7 does take regular cortisone shots & oxycodone as his pain regimen. EMS reports that upon their arrival they found that he is hypotensive at 70/40 & 82% on RA. Hx of COPD, Lung sounds have Rhonchi. Pt has an abd aortic aneurysm & had a coronary stent place 2 weeks ago. 18g PIV in Lt AC placed & fluids given, BP came up to 88/46. 84 bpm, 16 Resp, 93% O2 on 4L via n/c, A/Ox4.    Allergies Allergies  Allergen Reactions   Celebrex [Celecoxib] Hives and Itching   Tape     Tears skin. Prefers paper tape Please    Level of Care/Admitting Diagnosis ED Disposition     ED Disposition  Admit   Condition  --   [place]: [place] [[id number]]  Level of Care: Progressive [102]  Admit to Progressive based on following criteria: MULTISYSTEM THREATS such as stable sepsis, metabolic/electrolyte imbalance with or without encephalopathy that is responding to early treatment.  May admit patient to [facility] or [facility] if equivalent level of care is available:: No  Covid Evaluation: Asymptomatic - no recent exposure (last 10 days) testing not required  Diagnosis: AKI (acute kidney injury) [facility]) [[id number]]  Admitting Physician: Rhetta Mura [[id number]]  Attending Physician: Rhetta Mura [[id number]]  Certification:: I certify this patient will need inpatient services for at least 2 midnights  Estimated Length of Stay: 2          B Medical/Surgery History Past Medical History:   Diagnosis Date   Abdominal aneurysm ([place])    Arthritis    "all over" (07/19/2016)   BPH (benign prostatic hypertrophy)    CAD (coronary artery disease)    Chicken pox    Chronic lower back pain    s/p surgical fusion   Depression    Diverticulosis    Esophageal stricture    GERD (gastroesophageal reflux disease)    Hepatitis B 1984   Hiatal hernia    History of radiation therapy 01/16/18- 03/05/18   Left Tonsil, 66 Gy in 33 fractions to high risk nodal echelons.    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 Burt Knack and Dr Cyndia Bent    Severe aortic stenosis    TIA (transient ischemic attack) 1990s   hx   tonsillar ca dx'd 11/2017   Tubular adenoma of colon 2009   Past Surgical History:  Procedure Laterality Date   ABDOMINAL AORTIC ENDOVASCULAR STENT GRAFT N/A 03/03/2021   Procedure: ABDOMINAL AORTIC ENDOVASCULAR STENT GRAFT;  Surgeon: Waynetta Sandy, MD;  Location: [place];  Service: Vascular;  Laterality: N/A;   ABDOMINAL AORTOGRAM W/LOWER EXTREMITY N/A 04/10/2021   Procedure: ABDOMINAL AORTOGRAM W/LOWER EXTREMITY;  Surgeon: Waynetta Sandy, MD;  Location: [place] [facility];  Service: Cardiovascular;  Laterality: N/A;   BACK SURGERY     CARDIAC CATHETERIZATION     CATARACT EXTRACTION W/ INTRAOCULAR LENS  IMPLANT, BILATERAL Bilateral 01/2012 - 02/2012   CORONARY ATHERECTOMY N/A 04/27/2020   Procedure: CORONARY ATHERECTOMY;  Surgeon: Sherren Mocha, MD;  Location: [place] [facility];  Service: Cardiovascular;  Laterality: N/A;   CORONARY STENT INTERVENTION N/A 04/27/2020   Procedure: CORONARY STENT INTERVENTION;  Surgeon: Sherren Mocha, MD;  Location: [place] [place] [facility];  Service: Cardiovascular;  Laterality: N/A;   DIRECT LARYNGOSCOPY Left 10/09/2017   Procedure: DIRECT  LARYNGOSCOPY WITH BOPSY;  Surgeon: Jodi Marble, MD;  Location: [place];  Service: ENT;  Laterality: Left;   ESOPHAGOGASTRODUODENOSCOPY (EGD) WITH ESOPHAGEAL DILATION     "couple times" (07/19/2016)   ESOPHAGOSCOPY Left 10/09/2017   Procedure: ESOPHAGOSCOPY;  Surgeon: Jodi Marble, MD;  Location: [place];  Service: ENT;  Laterality: Left;   INTRAVASCULAR IMAGING/OCT N/A 04/27/2020   Procedure: INTRAVASCULAR IMAGING/OCT;  Surgeon: Sherren Mocha, MD;  Location: [place] [facility];  Service: Cardiovascular;  Laterality: N/A;   INTRAVASCULAR PRESSURE WIRE/FFR STUDY N/A 04/27/2020   Procedure: INTRAVASCULAR PRESSURE WIRE/FFR STUDY;  Surgeon: Sherren Mocha, MD;  Location: [place] [facility];  Service: Cardiovascular;  Laterality: N/A;   IR GASTROSTOMY TUBE MOD SED  01/08/2018   IR THORACENTESIS ASP PLEURAL SPACE W/IMG GUIDE  07/19/2016   LAPAROSCOPIC CHOLECYSTECTOMY  1984   LUMBAR [place] SURGERY  05/1996   L4-5; Dr. Claudean Kinds LAMINECTOMY/DECOMPRESSION MICRODISCECTOMY  10/2002   L3-4. Dr. Sherwood Gambler   MULTIPLE TOOTH EXTRACTIONS  1980s   PARTIAL GLOSSECTOMY  12/02/2017   Dr. Nico[facility]- [facility]   pharyngoplasty for closure of tingue base defect  12/02/2017   Dr. Nicolette Bang- [place]  07/15/2016   POSTERIOR LUMBAR FUSION  09/1996   Ray cage, L4-5 Dr. Rita Ohara   PROSTATE BIOPSY  ~ 2017   radical tonsillectomy Left 12/02/2017   Dr. Nicolette Bang at [place] N/A 03/31/2020   Procedure: RIGHT HEART CATH AND CORONARY ANGIOGRAPHY;  Surgeon: Larey Dresser, MD;  Location: [place] [facility];  Service: Cardiovascular;  Laterality: N/A;   RIGID BRONCHOSCOPY Left 10/09/2017   Procedure: RIGID BRONCHOSCOPY;  Surgeon: Jodi Marble, MD;  Location: [place];  Service: ENT;  Laterality: Left;   TEE WITHOUT CARDIOVERSION N/A 06/07/2020   Procedure: TRANSESOPHAGEAL ECHOCARDIOGRAM (TEE);  Surgeon:  Sherren Mocha, MD;  Location: [facility];  Service: Open Heart Surgery;  Laterality: N/A;   TONSILLECTOMY     TOTAL HIP ARTHROPLASTY Left 12/04/2020   Procedure: TOTAL HIP ARTHROPLASTY ANTERIOR APPROACH;  Surgeon: Rod Can, MD;  Location: [facility];  Service: Orthopedics;  Laterality: Left;   TOTAL HIP ARTHROPLASTY Left 11/2020   TRACHEOSTOMY  12/02/2017   Dr. Nicolette Bang- [facility]   ULTRASOUND GUIDANCE FOR VASCULAR ACCESS Right 06/07/2020   Procedure: ULTRASOUND GUIDANCE FOR VASCULAR ACCESS;  Surgeon: Sherren Mocha, MD;  Location: [place];  Service: Open Heart Surgery;  Laterality: Right;   ULTRASOUND GUIDANCE FOR VASCULAR ACCESS Bilateral 03/03/2021   Procedure: ULTRASOUND GUIDANCE FOR VASCULAR ACCESS, BILATERAL FEMORAL ARTERIES;  Surgeon: Waynetta Sandy, MD;  Location: [place];  Service: Vascular;  Laterality: Bilateral;   VASCULAR SURGERY       A IV Location/Drains/Wounds Patient Lines/Drains/Airways Status     Active Line/Drains/Airways     Name Placement date Placement time Site Days   Peripheral IV 08/17/21 18 G  Anterior;Distal;Left;Upper Arm 08/17/21  1716  Arm  1   Peripheral IV 08/17/21 20 G Anterior;Proximal;Right Forearm 08/17/21  1952  Forearm  1   Peripheral IV 08/17/21 18 G 1" Posterior;Right Forearm 08/17/21  1952  Forearm  1   Wound / Incision (Open or Dehisced) 12/03/20 Skin tear Elbow Right;Posterior 3 seperate skin tears on elbow 12/03/20  2030  Elbow  258   Wound / Incision (Open or Dehisced) 12/03/20 Skin tear Elbow Left;Posterior skin tear 12/03/20  2030  Elbow  258   Wound / Incision (Open or Dehisced) 06/26/21 Skin tear Arm Anterior;Lower;Right 06/26/21  1930  Arm  53            Intake/Output Last 24 hours  Intake/Output Summary (Last 24 hours) at 08/18/2021 1325 Last data filed at 08/18/2021 1122 Gross per 24 hour  Intake 2275.15 ml  Output 650 ml  Net 1625.15 ml    Labs/Imaging Results for orders placed or performed during the hospital encounter  of 08/17/21 (from the past 48 hour(s))  CBC with Differential     Status: Abnormal   Collection Time: 08/17/21  6:11 PM  Result Value Ref Range   WBC 8.5 4.0 - 10.5 K/uL   RBC 2.67 (L) 4.22 - 5.81 MIL/uL   Hemoglobin 7.6 (L) 13.0 - 17.0 g/dL   HCT 24.4 (L) 39.0 - 52.0 %   MCV 91.4 80.0 - 100.0 fL   MCH 28.5 26.0 - 34.0 pg   MCHC 31.1 30.0 - 36.0 g/dL   RDW 18.0 (H) 11.5 - 15.5 %   Platelets 102 (L) 150 - 400 K/uL    Comment: Immature Platelet Fraction may be clinically indicated, consider ordering this additional test [id number]    nRBC 0.0 0.0 - 0.2 %   Neutrophils Relative % 87 %   Neutro Abs 7.6 1.7 - 7.7 K/uL   Band Neutrophils 2 %   Lymphocytes Relative 8 %   Lymphs Abs 0.7 0.7 - 4.0 K/uL   Monocytes Relative 2 %   Monocytes Absolute 0.2 0.1 - 1.0 K/uL   Eosinophils Relative 0 %   Eosinophils Absolute 0.0 0.0 - 0.5 K/uL   Basophils Relative 0 %   Basophils Absolute 0.0 0.0 - 0.1 K/uL   WBC Morphology See Note     Comment: Mild Left Shift. 1 to 5% Metas and Myelos, Occ Pro Noted.   nRBC 0 0 /100 WBC   Myelocytes 1 %   Abs Immature Granulocytes 0.10 (H) 0.00 - 0.07 K/uL   Polychromasia PRESENT    Ovalocytes PRESENT     Comment: Performed at [facility] [address], [place], [place] [address]  Lactic acid, plasma     Status: None   Collection Time: 08/17/21  6:11 PM  Result Value Ref Range   Lactic Acid, Venous 1.7 0.5 - 1.9 mmol/L    Comment: Performed at [facility] [address], [place], [place] [address]  Hepatic function panel     Status: Abnormal   Collection Time: 08/17/21  6:11 PM  Result Value Ref Range   Total Protein 4.4 (L) 6.5 - 8.1 g/dL   Albumin 2.3 (L) 3.5 - 5.0 g/dL   AST 23 15 - 41 U/L   ALT 15 0 - 44 U/L   Alkaline Phosphatase 71 38 - 126 U/L   Total Bilirubin 0.8 0.3 - 1.2 mg/dL   Bilirubin, Direct 0.2 0.0 - 0.2 mg/dL   Indirect Bilirubin 0.6 0.3 -  0.9 mg/dL    Comment: Performed at [facility], [place] [address], [place], [place] [address]  Sample to Blood Bank     Status: None   Collection Time: 08/17/21  6:11 PM  Result Value Ref Range   Blood Bank Specimen SAMPLE AVAILABLE FOR TESTING    Sample Expiration      08/18/2021,2359 Performed at [facility], [place] [address], [place], [place] [address]   Basic metabolic panel     Status: Abnormal   Collection Time: 08/17/21  6:11 PM  Result Value Ref Range   Sodium 134 (L) 135 - 145 mmol/L   Potassium 5.3 (H) 3.5 - 5.1 mmol/L   Chloride 104 98 - 111 mmol/L   CO2 22 22 - 32 mmol/L   Glucose, Bld 94 70 - 99 mg/dL    Comment: Glucose reference range applies only to samples taken after fasting for at least 8 hours.   BUN 63 (H) 8 - 23 mg/dL   Creatinine, Ser 2.46 (H) 0.61 - 1.24 mg/dL   Calcium 7.1 (L) 8.9 - 10.3 mg/dL   GFR, Estimated 26 (L) >60 mL/min    Comment: (NOTE) Calculated using the CKD-EPI Creatinine Equation (2021)    Anion gap 8 5 - 15    Comment: Performed at [facility] [address], [place], [place] [address]  Type and screen [place]     Status: None   Collection Time: 08/17/21  6:11 PM  Result Value Ref Range   ABO/RH(D) O POS    Antibody Screen NEG    Sample Expiration      08/20/2021,2359 Performed at [facility], [place] [address], [place], [place] [address]   I-stat chem 8, ED     Status: Abnormal   Collection Time: 08/17/21  6:20 PM  Result Value Ref Range   Sodium 133 (L) 135 - 145 mmol/L   Potassium 5.3 (H) 3.5 - 5.1 mmol/L   Chloride 103 98 - 111 mmol/L   BUN 69 (H) 8 - 23 mg/dL   Creatinine, Ser 2.60 (H) 0.61 - 1.24 mg/dL   Glucose, Bld 82 70 - 99 mg/dL    Comment: Glucose reference range applies only to samples taken after fasting for at least 8 hours.   Calcium, Ion 1.00 (L) 1.15 - 1.40 mmol/L   TCO2 20 (L) 22 - 32 mmol/L   Hemoglobin 7.5 (L) 13.0 - 17.0 g/dL   HCT 22.0 (L) 39.0 - 52.0 %  Culture, blood (routine x 2)     Status: None (Preliminary result)   Collection  Time: 08/17/21  7:41 PM   Specimen: BLOOD RIGHT FOREARM  Result Value Ref Range   Specimen Description BLOOD RIGHT FOREARM    Special Requests      BOTTLES DRAWN AEROBIC AND ANAEROBIC Blood Culture results may not be optimal due to an excessive volume of blood received in culture bottles   Culture      NO GROWTH < 12 HOURS Performed at [facility], [place] [address], [place], [place] [address]    Report Status PENDING   Culture, blood (routine x 2)     Status: None (Preliminary result)   Collection Time: 08/17/21  7:52 PM   Specimen: BLOOD RIGHT HAND  Result Value Ref Range   Specimen Description BLOOD RIGHT HAND    Special Requests      BOTTLES DRAWN AEROBIC AND ANAEROBIC Blood Culture adequate volume  Culture      NO GROWTH < 12 HOURS Performed at [facility] [address], [place], [place] [address]    Report Status PENDING   Lactic acid, plasma     Status: None   Collection Time: 08/17/21 10:14 PM  Result Value Ref Range   Lactic Acid, Venous 0.8 0.5 - 1.9 mmol/L    Comment: Performed at [facility] [address] [place], [place] [address]  POC occult blood, ED     Status: Abnormal   Collection Time: 08/17/21 11:36 PM  Result Value Ref Range   Fecal Occult Bld POSITIVE (A) NEGATIVE  Comprehensive metabolic panel     Status: Abnormal   Collection Time: 08/18/21  2:39 AM  Result Value Ref Range   Sodium 134 (L) 135 - 145 mmol/L   Potassium 5.0 3.5 - 5.1 mmol/L   Chloride 104 98 - 111 mmol/L   CO2 22 22 - 32 mmol/L   Glucose, Bld 88 70 - 99 mg/dL    Comment: Glucose reference range applies only to samples taken after fasting for at least 8 hours.   BUN 59 (H) 8 - 23 mg/dL   Creatinine, Ser 1.78 (H) 0.61 - 1.24 mg/dL   Calcium 7.5 (L) 8.9 - 10.3 mg/dL   Total Protein 5.0 (L) 6.5 - 8.1 g/dL   Albumin 2.5 (L) 3.5 - 5.0 g/dL   AST 30 15 - 41 U/L   ALT 17 0 - 44 U/L   Alkaline Phosphatase 91 38 - 126 U/L   Total Bilirubin 0.6 0.3 - 1.2 mg/dL    GFR, Estimated 39 (L) >60 mL/min    Comment: (NOTE) Calculated using the CKD-EPI Creatinine Equation (2021)    Anion gap 8 5 - 15    Comment: Performed at [facility], [place] [address] [place], [place] [address]  Magnesium     Status: Abnormal   Collection Time: 08/18/21  2:39 AM  Result Value Ref Range   Magnesium 1.3 (L) 1.7 - 2.4 mg/dL    Comment: Performed at [facility] [address], [place],  [address]  Procalcitonin - Baseline     Status: None   Collection Time: 08/18/21  2:39 AM  Result Value Ref Range   Procalcitonin 15.99 ng/mL    Comment:        Interpretation: PCT >= 10 ng/mL: Important systemic inflammatory response, almost exclusively due to severe bacterial sepsis or septic shock. (NOTE)       Sepsis PCT Algorithm           Lower Respiratory Tract                                      Infection PCT Algorithm    ----------------------------     ----------------------------         PCT < 0.25 ng/mL                PCT < 0.10 ng/mL          Strongly encourage             Strongly discourage   discontinuation of antibiotics    initiation of antibiotics    ----------------------------     -----------------------------       PCT 0.25 - 0.50 ng/mL            PCT 0.10 -  0.25 ng/mL               OR       >80% decrease in PCT            Discourage initiation of                                            antibiotics      Encourage discontinuation           of antibiotics    ----------------------------     -----------------------------         PCT >= 0.50 ng/mL              PCT 0.26 - 0.50 ng/mL                AND       <80% decrease in PCT             Encourage initiation of                                             antibiotics       Encourage continuation           of antibiotics    ----------------------------     -----------------------------        PCT >= 0.50 ng/mL                  PCT > 0.50 ng/mL               AND         increase in  PCT                  Strongly encourage                                      initiation of antibiotics    Strongly encourage escalation           of antibiotics                                     -----------------------------                                           PCT <= 0.25 ng/mL                                                 OR                                        > 80% decrease in PCT  Discontinue / Do not initiate                                             antibiotics  Performed at [facility], [place] [address], [place], [place] [address]   Magnesium     Status: Abnormal   Collection Time: 08/18/21  5:18 AM  Result Value Ref Range   Magnesium 1.4 (L) 1.7 - 2.4 mg/dL    Comment: Performed at [facility] [address], [place], [place] [address]  Urinalysis, Complete w Microscopic     Status: Abnormal   Collection Time: 08/18/21  5:18 AM  Result Value Ref Range   Color, Urine YELLOW YELLOW   APPearance CLEAR CLEAR   Specific Gravity, Urine 1.021 1.005 - 1.030   pH 5.0 5.0 - 8.0   Glucose, UA NEGATIVE NEGATIVE mg/dL   Hgb urine dipstick SMALL (A) NEGATIVE   Bilirubin Urine NEGATIVE NEGATIVE   Ketones, ur NEGATIVE NEGATIVE mg/dL   Protein, ur NEGATIVE NEGATIVE mg/dL   Nitrite NEGATIVE NEGATIVE   Leukocytes,Ua NEGATIVE NEGATIVE   WBC, UA 0-5 0 - 5 WBC/hpf   Bacteria, UA NONE SEEN NONE SEEN   Squamous Epithelial / LPF 0-5 0 - 5   RBC / HPF 0-5 0 - 5 RBC/hpf    Comment: Performed at [facility], [place] [address], [place], [place] [address]  Reticulocytes     Status: Abnormal   Collection Time: 08/18/21  5:18 AM  Result Value Ref Range   Retic Ct Pct 2.5 0.4 - 3.1 %   RBC. 2.77 (L) 4.22 - 5.81 MIL/uL   Retic Count, Absolute 69.8 19.0 - 186.0 K/uL   Immature Retic Fract 20.8 (H) 2.3 - 15.9 %    Comment: Performed at [facility] [address], [place], [place] [address]  Brain natriuretic peptide      Status: Abnormal   Collection Time: 08/18/21  5:18 AM  Result Value Ref Range   B Natriuretic Peptide 632.7 (H) 0.0 - 100.0 pg/mL    Comment: Performed at [facility] [address] [place],  [address]  CBC with Differential/Platelet     Status: Abnormal   Collection Time: 08/18/21  5:18 AM  Result Value Ref Range   WBC 7.1 4.0 - 10.5 K/uL   RBC 2.81 (L) 4.22 - 5.81 MIL/uL   Hemoglobin 8.0 (L) 13.0 - 17.0 g/dL   HCT 25.7 (L) 39.0 - 52.0 %   MCV 91.5 80.0 - 100.0 fL   MCH 28.5 26.0 - 34.0 pg   MCHC 31.1 30.0 - 36.0 g/dL   RDW 18.3 (H) 11.5 - 15.5 %   Platelets 83 (L) 150 - 400 K/uL    Comment: Immature Platelet Fraction may be clinically indicated, consider ordering this additional test [id number] REPEATED TO VERIFY    nRBC 0.0 0.0 - 0.2 %   Neutrophils Relative % 90 %   Neutro Abs 6.3 1.7 - 7.7 K/uL   Lymphocytes Relative 6 %   Lymphs Abs 0.4 (L) 0.7 - 4.0 K/uL   Monocytes Relative 3 %   Monocytes Absolute 0.2 0.1 - 1.0 K/uL   Eosinophils Relative 0 %   Eosinophils Absolute 0.0 0.0 - 0.5 K/uL   Basophils Relative 0 %   Basophils Absolute 0.0 0.0 - 0.1 K/uL  WBC Morphology MILD LEFT SHIFT (1-5% METAS, OCC MYELO, OCC BANDS)    RBC Morphology See Note    Smear Review Normal platelet morphology    Immature Granulocytes 1 %   Abs Immature Granulocytes 0.09 (H) 0.00 - 0.07 K/uL   Bite Cells PRESENT    Tear Drop Cells PRESENT    Ovalocytes PRESENT     Comment: Performed at [facility] [address], [place], [place] [address]  Creatinine, urine, random     Status: None   Collection Time: 08/18/21  5:18 AM  Result Value Ref Range   Creatinine, Urine 65 mg/dL    Comment: Performed at [facility] [address], [place], [place] [address]  Sodium, urine, random     Status: None   Collection Time: 08/18/21  5:18 AM  Result Value Ref Range   Sodium, Ur 23 mmol/L    Comment: Performed at [facility] [address] [place], [place]  [address]  I-Stat venous blood gas, [facility] ED only)     Status: Abnormal   Collection Time: 08/18/21  5:48 AM  Result Value Ref Range   pH, Ven 7.404 7.25 - 7.43   pCO2, Ven 36.9 (L) 44 - 60 mmHg   pO2, Ven 136 (H) 32 - 45 mmHg   Bicarbonate 23.1 20.0 - 28.0 mmol/L   TCO2 24 22 - 32 mmol/L   O2 Saturation 99 %   Acid-base deficit 1.0 0.0 - 2.0 mmol/L   Sodium 135 135 - 145 mmol/L   Potassium 5.0 3.5 - 5.1 mmol/L   Calcium, Ion 1.12 (L) 1.15 - 1.40 mmol/L   HCT 24.0 (L) 39.0 - 52.0 %   Hemoglobin 8.2 (L) 13.0 - 17.0 g/dL   Sample type VENOUS    *Note: Due to a large number of results and/or encounters for the requested time period, some results have not been displayed. A complete set of results can be found in Results Review.   CT Angio Chest/Abd/Pel for Dissection W and/or Wo Contrast  Result Date: 08/17/2021 CLINICAL DATA:  Chest pain or back pain, aortic dissection suspected EXAM: CT ANGIOGRAPHY CHEST, ABDOMEN AND PELVIS TECHNIQUE: Non-contrast CT of the chest was initially obtained. Multidetector CT imaging through the chest, abdomen and pelvis was performed using the standard protocol during bolus administration of intravenous contrast. Multiplanar reconstructed images and MIPs were obtained and reviewed to evaluate the vascular anatomy. RADIATION DOSE REDUCTION: This exam was performed according to the departmental dose-optimization program which includes automated exposure control, adjustment of the mA and/or kV according to patient size and/or use of iterative reconstruction technique. CONTRAST:  41m OMNIPAQUE IOHEXOL 350 MG/ML SOLN COMPARISON:  CTA abdomen pelvis 03/24/2021, CT chest abdomen pelvis 12/03/2020 FINDINGS: CTA CHEST FINDINGS Cardiovascular: Extensive coronary artery calcification predominantly within the left anterior descending coronary artery. Transcatheter aortic valve replacement has been performed. Global cardiac size within normal limits. No pericardial effusion. Central  pulmonary arteries are enlarged in keeping with changes of pulmonary arterial hypertension, stable since prior examination. Moderate atherosclerotic calcification is seen within the thoracic aorta. No aortic aneurysm, intramural hematoma, or dissection. Mediastinum/Nodes: Visualized thyroid is unremarkable. No pathologic thoracic adenopathy. The esophagus is unremarkable. Lungs/Pleura: Airspace infiltrate is seen throughout the right lung with focal consolidation within the superior segment of the right lower lobe, in keeping with acute infection in the appropriate clinical setting. Associated bronchial wall thickening in keeping with airway inflammation. No central obstructing mass. Small amount of layering  debris noted within the right mainstem bronchus and bronchus intermedius. Minimal patchy infiltrate also noted within the left upper lobe. No pneumothorax or pleural effusion. Musculoskeletal: No acute bone abnormality. No lytic or blastic bone lesion. Review of the MIP images confirms the above findings. CTA ABDOMEN AND PELVIS FINDINGS VASCULAR Aorta: Fusiform infrarenal abdominal aortic aneurysm has undergone endovascular repair utilizing a bifurcated stent graft with infrarenal proximal fixation and bilateral common iliac distal fixation. The aneurysm sac is unchanged in size when measured in identical fashion measuring 5.4 x 4.4 cm at axial image # 173/7 (measuring identically on image # 66/7 of previous examination). The repaired segment appears patent. Superimposed extensive atherosclerotic calcification. No periaortic inflammatory change or fluid collections are identified. Celiac: Less than 50% stenosis of the origin, unchanged. Distally widely patent. Variant anatomy with the celiac axis supplying only the left gastric artery and splenic artery. SMA: Replaced common hepatic artery. Less than 50% stenosis of the origin. Distally widely patent. No aneurysm or dissection. Renals: Dual renal arteries  bilaterally with accessory lower pole renal arteries noted. 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 Normal vascular morphology. No aneurysm or dissection. 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 Distally widely patent. Inflow: No evidence of hemodynamically significant stenosis involving the lower extremity arterial inflow bilaterally. Internal iliac arteries are stenotic at their origins, but are patent bilaterally. Veins: No obvious venous abnormality within the limitations of this arterial phase study. Review of the MIP images confirms the above findings. NON-VASCULAR Hepatobiliary: No focal liver abnormality is seen. Status post cholecystectomy. No biliary dilatation. Pancreas: Unremarkable Spleen: Unremarkable Adrenals/Urinary Tract: The adrenal glands are unremarkable. The kidneys are normal. Bladder is unremarkable. Stomach/Bowel: Moderate descending and sigmoid colonic diverticulosis without superimposed acute inflammatory change. The stomach, small bowel, and large bowel are otherwise unremarkable. Appendix normal. No free intraperitoneal gas or fluid. Lymphatic: No pathologic adenopathy within the abdomen and pelvis. Reproductive: Mild prostatic enlargement Other: No abdominal wall hernia Musculoskeletal: Left total hip arthroplasty and L4-5 hemilaminectomies and lumbar discectomy with interbody bone cage placement has been performed. No definite bridging callus identified at L4-5. No acute bone abnormality. No lytic or blastic bone lesion. Review of the MIP images confirms the above findings. IMPRESSION: 1. No evidence of thoracoabdominal aortic dissection. 2. Extensive coronary artery calcification. Status post transcatheter aortic valve replacement. 3. Multifocal pulmonary infiltrate, most severe within the  superior segment of the right lower lobe, in keeping with acute infection in the appropriate clinical setting. Associated bronchial wall thickening in keeping with airway inflammation. No central obstructing mass identified. 4. Stable changes of pulmonary arterial hypertension. 5. Stable infrarenal abdominal aortic aneurysm status post endovascular repair. Stable aneurysm sac size at 5.4 cm. 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 6. Bilateral renal artery hemodynamically significant stenoses, involving the main right renal artery and both the main as well as accessory lower polar left renal arteries. 7. Moderate distal colonic diverticulosis without superimposed acute inflammatory change. 8. Postsurgical changes of L4-5 hemilaminectomies and lumbar discectomy with interbody bone cage placement. No definite bridging callus identified at L4-5. Electronically Signed   By: Fidela Salisbury M.D.   On: 08/17/2021 23:13    Pending Labs Unresulted Labs (From admission, onward)     Start     Ordered   08/18/21 0900  Hemoglobin and hematocrit, blood  Once-Timed,   TIMED  08/17/21 2358   08/18/21 0500  CBC with Differential/Platelet  Tomorrow morning,   R        08/17/21 2357   08/18/21 0300  CBC with Differential/Platelet  Once,   R        08/18/21 0300   08/18/21 0300  Iron and TIBC  Once,   AD        08/18/21 0300   08/18/21 0300  Ferritin  Once,   AD        08/18/21 0300   08/18/21 0300  Protime-INR  Once,   AD        08/18/21 0300   08/18/21 0219  Cortisol  Add-on,   AD        08/18/21 0219   08/18/21 0214  Strep pneumoniae urinary antigen  Add-on,   AD        08/18/21 0213            Vitals/Pain Today's Vitals   08/18/21 1212 08/18/21 1230 08/18/21 1300 08/18/21 1315  BP:  133/76 (!) 154/96   Pulse:  93 (!) 111 (!) 117  Resp:  (!) 23 (!) 21 17  Temp: 98.4 F (36.9 C)     TempSrc: Oral     SpO2:  100% 100% 99%  Weight:      Height:      PainSc:        Isolation Precautions No active  isolations  Medications Medications  acetaminophen (TYLENOL) tablet 650 mg (has no administration in time range)    Or  acetaminophen (TYLENOL) suppository 650 mg (has no administration in time range)  lactated ringers infusion ( Intravenous New Bag/Given 08/18/21 1201)  albuterol (PROVENTIL) (2.5 MG/3ML) 0.083% nebulizer solution 2.5 mg (2.5 mg Nebulization Given 08/18/21 0137)  azithromycin (ZITHROMAX) 500 mg in sodium chloride 0.9 % 250 mL IVPB (0 mg Intravenous Stopped 08/18/21 0332)  cefTRIAXone (ROCEPHIN) 1 g in sodium chloride 0.9 % 100 mL IVPB (0 g Intravenous Stopped 08/18/21 0302)  methylPREDNISolone sodium succinate (SOLU-MEDROL) 125 mg/2 mL injection 80 mg (80 mg Intravenous Given 08/18/21 1155)  ipratropium-albuterol (DUONEB) 0.5-2.5 (3) MG/3ML nebulizer solution 3 mL (3 mLs Nebulization Not Given 08/18/21 1118)  atorvastatin (LIPITOR) tablet 80 mg (80 mg Oral Given 08/18/21 1154)  citalopram (CELEXA) tablet 20 mg (20 mg Oral Given 08/18/21 1154)  ezetimibe (ZETIA) tablet 10 mg (10 mg Oral Given 08/18/21 1154)  traMADol (ULTRAM) tablet 100 mg (100 mg Oral Given 08/18/21 0231)  vitamin B-12 (CYANOCOBALAMIN) tablet 1,000 mcg (1,000 mcg Oral Given 08/18/21 1154)  naloxone (NARCAN) injection 0.4 mg (has no administration in time range)  fentaNYL (SUBLIMAZE) injection 50 mcg (has no administration in time range)  pantoprazole (PROTONIX) EC tablet 40 mg (has no administration in time range)  Oxycodone HCl TABS 10 mg (has no administration in time range)  sodium chloride 0.9 % bolus 500 mL (0 mLs Intravenous Stopped 08/17/21 1933)  sodium chloride 0.9 % bolus 500 mL (0 mLs Intravenous Stopped 08/17/21 2209)  iohexol (OMNIPAQUE) 350 MG/ML injection 80 mL (80 mLs Intravenous Contrast Given 08/17/21 2246)    Mobility walks Moderate fall risk   Focused Assessments Pulmonary Assessment Handoff:  Lung sounds: Bilateral rhonchi O2 Device: Nasal Cannula O2 Flow Rate (L/min): 6  L/min    R Recommendations: See Admitting Provider Note  Report given to:   Additional Notes:

## 2021-08-19 ENCOUNTER — Inpatient Hospital Stay (HOSPITAL_COMMUNITY): Payer: Medicare Other

## 2021-08-19 DIAGNOSIS — R079 Chest pain, unspecified: Secondary | ICD-10-CM | POA: Diagnosis not present

## 2021-08-19 DIAGNOSIS — N179 Acute kidney failure, unspecified: Secondary | ICD-10-CM | POA: Diagnosis not present

## 2021-08-19 DIAGNOSIS — D649 Anemia, unspecified: Secondary | ICD-10-CM | POA: Diagnosis not present

## 2021-08-19 DIAGNOSIS — J189 Pneumonia, unspecified organism: Secondary | ICD-10-CM | POA: Diagnosis not present

## 2021-08-19 DIAGNOSIS — J9601 Acute respiratory failure with hypoxia: Secondary | ICD-10-CM | POA: Diagnosis not present

## 2021-08-19 DIAGNOSIS — R0789 Other chest pain: Secondary | ICD-10-CM

## 2021-08-19 LAB — CBC WITH DIFFERENTIAL/PLATELET
Abs Immature Granulocytes: 0.11 10*3/uL — ABNORMAL HIGH (ref 0.00–0.07)
Basophils Absolute: 0 10*3/uL (ref 0.0–0.1)
Basophils Relative: 0 %
Eosinophils Absolute: 0 10*3/uL (ref 0.0–0.5)
Eosinophils Relative: 0 %
HCT: 25.2 % — ABNORMAL LOW (ref 39.0–52.0)
Hemoglobin: 8.1 g/dL — ABNORMAL LOW (ref 13.0–17.0)
Immature Granulocytes: 2 %
Lymphocytes Relative: 5 %
Lymphs Abs: 0.3 10*3/uL — ABNORMAL LOW (ref 0.7–4.0)
MCH: 28.2 pg (ref 26.0–34.0)
MCHC: 32.1 g/dL (ref 30.0–36.0)
MCV: 87.8 fL (ref 80.0–100.0)
Monocytes Absolute: 0.2 10*3/uL (ref 0.1–1.0)
Monocytes Relative: 3 %
Neutro Abs: 5.1 10*3/uL (ref 1.7–7.7)
Neutrophils Relative %: 90 %
Platelets: 75 10*3/uL — ABNORMAL LOW (ref 150–400)
RBC: 2.87 MIL/uL — ABNORMAL LOW (ref 4.22–5.81)
RDW: 17.5 % — ABNORMAL HIGH (ref 11.5–15.5)
WBC: 5.6 10*3/uL (ref 4.0–10.5)
nRBC: 0 % (ref 0.0–0.2)

## 2021-08-19 LAB — COMPREHENSIVE METABOLIC PANEL
ALT: 16 U/L (ref 0–44)
AST: 21 U/L (ref 15–41)
Albumin: 2.5 g/dL — ABNORMAL LOW (ref 3.5–5.0)
Alkaline Phosphatase: 89 U/L (ref 38–126)
Anion gap: 7 (ref 5–15)
BUN: 36 mg/dL — ABNORMAL HIGH (ref 8–23)
CO2: 20 mmol/L — ABNORMAL LOW (ref 22–32)
Calcium: 8.4 mg/dL — ABNORMAL LOW (ref 8.9–10.3)
Chloride: 110 mmol/L (ref 98–111)
Creatinine, Ser: 0.86 mg/dL (ref 0.61–1.24)
GFR, Estimated: >60 mL/min (ref >60)
Glucose, Bld: 192 mg/dL — ABNORMAL HIGH (ref 70–99)
Potassium: 4.6 mmol/L (ref 3.5–5.1)
Sodium: 137 mmol/L (ref 135–145)
Total Bilirubin: 0.6 mg/dL (ref 0.3–1.2)
Total Protein: 5 g/dL — ABNORMAL LOW (ref 6.5–8.1)

## 2021-08-19 LAB — PROCALCITONIN: Procalcitonin: 5.87 ng/mL

## 2021-08-19 LAB — PHOSPHORUS: Phosphorus: 2.6 mg/dL (ref 2.5–4.6)

## 2021-08-19 LAB — ECHOCARDIOGRAM LIMITED
Height: 66.5 in
Weight: 2804.25 oz

## 2021-08-19 LAB — MAGNESIUM: Magnesium: 1.6 mg/dL — ABNORMAL LOW (ref 1.7–2.4)

## 2021-08-19 LAB — TROPONIN I (HIGH SENSITIVITY)
Troponin I (High Sensitivity): 276 ng/L (ref <18)
Troponin I (High Sensitivity): 245 ng/L (ref <18)

## 2021-08-19 MED ORDER — ASPIRIN 81 MG PO TBEC
81.0000 mg | DELAYED_RELEASE_TABLET | Freq: Every day | ORAL | Status: DC
  Administered 2021-08-19 – 2021-08-21 (×3): 81 mg via ORAL
  Filled 2021-08-19 (×3): qty 1

## 2021-08-19 MED ORDER — MAGNESIUM SULFATE 2 GM/50ML IV SOLN
2.0000 g | Freq: Once | INTRAVENOUS | Status: AC
Start: 2021-08-19 — End: 2021-08-19
  Administered 2021-08-19: 2 g via INTRAVENOUS
  Filled 2021-08-19: qty 50

## 2021-08-19 MED ORDER — PERFLUTREN LIPID MICROSPHERE
1.0000 mL | INTRAVENOUS | Status: AC | PRN
  Administered 2021-08-19: 2 mL via INTRAVENOUS

## 2021-08-19 MED ORDER — NITROGLYCERIN 0.4 MG SL SUBL: 0.4000 mg | SUBLINGUAL_TABLET | SUBLINGUAL | Status: DC | PRN

## 2021-08-19 MED ORDER — METHYLPREDNISOLONE SODIUM SUCC 125 MG IJ SOLR
60.0000 mg | Freq: Two times a day (BID) | INTRAMUSCULAR | Status: DC
  Administered 2021-08-19 – 2021-08-21 (×4): 60 mg via INTRAVENOUS
  Filled 2021-08-19 (×4): qty 2

## 2021-08-19 MED ORDER — MAGNESIUM OXIDE -MG SUPPLEMENT 400 (240 MG) MG PO TABS
800.0000 mg | ORAL_TABLET | Freq: Once | ORAL | Status: AC
  Administered 2021-08-19: 800 mg via ORAL
  Filled 2021-08-19: qty 2

## 2021-08-19 MED ORDER — MELATONIN 5 MG PO TABS
5.0000 mg | ORAL_TABLET | Freq: Every evening | ORAL | Status: DC | PRN
  Administered 2021-08-19 – 2021-08-20 (×2): 5 mg via ORAL
  Filled 2021-08-19 (×2): qty 1

## 2021-08-19 MED ORDER — HEPARIN SODIUM (PORCINE) 5000 UNIT/ML IJ SOLN
5000.0000 [IU] | Freq: Three times a day (TID) | INTRAMUSCULAR | Status: DC
Start: 2021-08-19 — End: 2021-08-21
  Administered 2021-08-19 – 2021-08-21 (×6): 5000 [IU] via SUBCUTANEOUS
  Filled 2021-08-19 (×6): qty 1

## 2021-08-19 MED ORDER — HEPARIN (PORCINE) 25000 UT/250ML-% IV SOLN
1100.0000 [IU]/h | INTRAVENOUS | Status: DC
  Filled 2021-08-19: qty 250

## 2021-08-19 MED ORDER — HEPARIN BOLUS VIA INFUSION
4000.0000 [IU] | Freq: Once | INTRAVENOUS | Status: DC
  Filled 2021-08-19: qty 4000

## 2021-08-19 MED ORDER — CARVEDILOL 12.5 MG PO TABS
12.5000 mg | ORAL_TABLET | Freq: Two times a day (BID) | ORAL | Status: DC
  Administered 2021-08-19 – 2021-08-21 (×5): 12.5 mg via ORAL
  Filled 2021-08-19 (×5): qty 1

## 2021-08-19 NOTE — Progress Notes (Signed)
  Echocardiogram 2D Echocardiogram with contrast has been performed.  Merrie Roof F 08/19/2021, 4:02 PM

## 2021-08-19 NOTE — Progress Notes (Signed)
PT Cancellation Note  Patient Details Name: Brandon [name] MRN: [id number] DOB: April 05, 1940   Cancelled Treatment:    Reason Eval/Treat Not Completed: Medical issues which prohibited therapy (On arrival, nurse in room and states pt just choked on pills and had chest pain.  MD ordered troponins and EKG and nurse asked PT to HOLD.  Will return as able.)   Alvira Philips 08/19/2021, 9:24 AM Romain Erion M,PT Acute Rehab Services [phone]

## 2021-08-19 NOTE — Evaluation (Signed)
Clinical/Bedside Swallow Evaluation Patient Details  Name: Brandon [name] MRN: [id number] Date of Birth: [birth year]  Today's Date: 08/19/2021 Time: SLP Start Time (ACUTE ONLY): 47 SLP Stop Time (ACUTE ONLY): 9735 SLP Time Calculation (min) (ACUTE ONLY): 36 min  Past Medical History:  Past Medical History:  Diagnosis Date   Abdominal aneurysm ([place])    Arthritis    "all over" (07/19/2016)   BPH (benign prostatic hypertrophy)    CAD (coronary artery disease)    Chicken pox    Chronic lower back pain    s/p surgical fusion   Depression    Diverticulosis    Esophageal stricture    GERD (gastroesophageal reflux disease)    Hepatitis B 1984   Hiatal hernia    History of radiation therapy 01/16/18- 03/05/18   Left Tonsil, 66 Gy in 33 fractions to high risk nodal echelons.    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 Burt Knack and Dr Cyndia Bent    Severe aortic stenosis    TIA (transient ischemic attack) 1990s   hx   tonsillar ca dx'd 11/2017   Tubular adenoma of colon 2009   Past Surgical History:  Past Surgical History:  Procedure Laterality Date   ABDOMINAL AORTIC ENDOVASCULAR STENT GRAFT N/A 03/03/2021   Procedure: ABDOMINAL AORTIC ENDOVASCULAR STENT GRAFT;  Surgeon: Waynetta Sandy, MD;  Location: [place];  Service: Vascular;  Laterality: N/A;   ABDOMINAL AORTOGRAM W/LOWER EXTREMITY N/A 04/10/2021   Procedure: ABDOMINAL AORTOGRAM W/LOWER EXTREMITY;  Surgeon: Waynetta Sandy, MD;  Location: [place] [facility];  Service: Cardiovascular;  Laterality: N/A;   BACK SURGERY     CARDIAC CATHETERIZATION     CATARACT EXTRACTION W/ INTRAOCULAR LENS  IMPLANT, BILATERAL Bilateral 01/2012 - 02/2012   CORONARY ATHERECTOMY N/A 04/27/2020   Procedure: CORONARY ATHERECTOMY;  Surgeon: Sherren Mocha,  MD;  Location: [place] [facility];  Service: Cardiovascular;  Laterality: N/A;   CORONARY STENT INTERVENTION N/A 04/27/2020   Procedure: CORONARY STENT INTERVENTION;  Surgeon: Sherren Mocha, MD;  Location: [place] [facility];  Service: Cardiovascular;  Laterality: N/A;   DIRECT LARYNGOSCOPY Left 10/09/2017   Procedure: DIRECT LARYNGOSCOPY WITH BOPSY;  Surgeon: Jodi Marble, MD;  Location: [place];  Service: ENT;  Laterality: Left;   ESOPHAGOGASTRODUODENOSCOPY (EGD) WITH ESOPHAGEAL DILATION     "couple times" (07/19/2016)   ESOPHAGOSCOPY Left 10/09/2017   Procedure: ESOPHAGOSCOPY;  Surgeon: Jodi Marble, MD;  Location: [place];  Service: ENT;  Laterality: Left;   INTRAVASCULAR IMAGING/OCT N/A 04/27/2020   Procedure: INTRAVASCULAR IMAGING/OCT;  Surgeon: Sherren Mocha, MD;  Location: [place] [facility];  Service: Cardiovascular;  Laterality: N/A;   INTRAVASCULAR PRESSURE WIRE/FFR STUDY N/A 04/27/2020   Procedure: INTRAVASCULAR PRESSURE WIRE/FFR STUDY;  Surgeon: Sherren Mocha, MD;  Location: [place] [facility];  Service: Cardiovascular;  Laterality: N/A;   IR GASTROSTOMY TUBE MOD SED  01/08/2018   IR THORACENTESIS ASP PLEURAL SPACE W/IMG GUIDE  07/19/2016   LAPAROSCOPIC CHOLECYSTECTOMY  1984   LUMBAR [place] SURGERY  05/1996   L4-5; Dr. Claudean Kinds LAMINECTOMY/DECOMPRESSION MICRODISCECTOMY  10/2002   L3-4. Dr. Sherwood Gambler   MULTIPLE TOOTH EXTRACTIONS  1980s   PARTIAL GLOSSECTOMY  12/02/2017   Dr. Nicolette Bang- [facility]   pharyngoplasty for closure of tingue  base defect  12/02/2017   Dr. Nicolette Bang- [place]  07/15/2016   POSTERIOR LUMBAR FUSION  09/1996   Ray cage, L4-5 Dr. Rita Ohara   PROSTATE BIOPSY  ~ 2017   radical tonsillectomy Left 12/02/2017   Dr. Nicolette Bang at [place] N/A 03/31/2020   Procedure: RIGHT HEART CATH AND CORONARY ANGIOGRAPHY;  Surgeon: Larey Dresser, MD;  Location: [place] [facility];  Service: Cardiovascular;  Laterality: N/A;   RIGID BRONCHOSCOPY Left 10/09/2017   Procedure: RIGID BRONCHOSCOPY;  Surgeon: Jodi Marble, MD;  Location: [place];  Service: ENT;  Laterality: Left;   TEE WITHOUT CARDIOVERSION N/A 06/07/2020   Procedure: TRANSESOPHAGEAL ECHOCARDIOGRAM (TEE);  Surgeon: Sherren Mocha, MD;  Location: [place];  Service: Open Heart Surgery;  Laterality: N/A;   TONSILLECTOMY     TOTAL HIP ARTHROPLASTY Left 12/04/2020   Procedure: TOTAL HIP ARTHROPLASTY ANTERIOR APPROACH;  Surgeon: Rod Can, MD;  Location: [facility];  Service: Orthopedics;  Laterality: Left;   TOTAL HIP ARTHROPLASTY Left 11/2020   TRACHEOSTOMY  12/02/2017   Dr. Nicolette Bang- [facility]   ULTRASOUND GUIDANCE FOR VASCULAR ACCESS Right 06/07/2020   Procedure: ULTRASOUND GUIDANCE FOR VASCULAR ACCESS;  Surgeon: Sherren Mocha, MD;  Location: [place];  Service: Open Heart Surgery;  Laterality: Right;   ULTRASOUND GUIDANCE FOR VASCULAR ACCESS Bilateral 03/03/2021   Procedure: ULTRASOUND GUIDANCE FOR VASCULAR ACCESS, BILATERAL FEMORAL ARTERIES;  Surgeon: Waynetta Sandy, MD;  Location: [facility];  Service: Vascular;  Laterality: Bilateral;   VASCULAR SURGERY     HPI:  Pt is a 81 yo male presenting with worsening lower back pain. He is admitted with sepsis and acute respiratory failure from PNA. Pt reportedly choked on his pills on 7/15. Pt was previously followed in 2020 by OP SLP after surgery and XRT for lingual cancer (onset 2019). FEES at [facility], most recent 01-15-18 noted with reduced tongue base retraction, reduced UES opening, reduced hyoid excursion. Pt was consuming purees and thin liquids at that time, only taking liquids after XRT began, but working back to Dys 2. PMH includes: GERD, esophageal stretching, AAA s/p stent, HTN, HLD, aortic stenosis s/p TAVR, chronic low back pain s/p L4-L5 hemilaminectomy and discectomy, CHF, anemia, tonsillar/lingual ca (s/p  transhyoid/transcervical resection of the tonsil and tongue base with left neck dissection on 12/02/17. 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 He reports that he self-manages this at home, avoiding specific foods that given him difficulty, and he had PNA one previous time last year. Today he demonstrates some multiple swallows, throat clearing, and intermittent, delayed coughing across all consistencies, also describing that he needs liquids to clear solids. He believes that these symptoms are similar to what he has at baseline, but perhaps a little exacerbated. Reviewed with him potential contributing factors, such as deconditioning from acute infection, decreased respiratory status requiring supplemental O2, and positioning that could be impacted by back pain and eating more in bed.   He acknowledges possible changes and is receptive to education about how dysphagia and possibly aspiration could contribute to and/or be exacerbated by acute PNA. MBS recommended, but pt is hesitant to do anything that might prolong his  hospital stay. He is agreeable to complete this if he remains inpatient when it can be scheduled. For now, he wishes to change to a Dys 3 diet and thin liquids, being as careful as possible during swallowing. He is also okay with SLP f/u acutely for potential to complete MBS while inpatient, but he would prefer to do test on an OP basis if he can be discharged first.  SLP Visit Diagnosis: Dysphagia, unspecified (R13.10)    Aspiration Risk  Mild aspiration risk;Moderate aspiration risk    Diet Recommendation Dysphagia 3 (Mech soft);Thin liquid   Liquid Administration via: Cup;Straw Medication  Administration: Whole meds with puree Supervision: Patient able to self feed;Intermittent supervision to cue for compensatory strategies Compensations: Slow rate;Small sips/bites;Follow solids with liquid Postural Changes: Seated upright at 90 degrees;Remain upright for at least 30 minutes after po intake    Other  Recommendations Oral Care Recommendations: Oral care BID    Recommendations for follow up therapy are one component of a multi-disciplinary discharge planning process, led by the attending physician.  Recommendations may be updated based on patient status, additional functional criteria and insurance authorization.  Follow up Recommendations Other (comment) (order OP MBS upon discharge)      Assistance Recommended at Discharge PRN  Functional Status Assessment Patient has had a recent decline in their functional status and demonstrates the ability to make significant improvements in function in a reasonable and predictable amount of time.  Frequency and Duration min 2x/week  2 weeks       Prognosis Prognosis for Safe Diet Advancement: Good Barriers to Reach Goals: Time post onset      Swallow Study   General HPI: Pt is a 81 yo male presenting with worsening lower back pain. He is admitted with sepsis and acute respiratory failure from PNA. Pt reportedly choked on his pills on 7/15. Pt was previously followed in 2020 by OP SLP after surgery and XRT for lingual cancer (onset 2019). FEES at [facility], most recent 01-15-18 noted with reduced tongue base retraction, reduced UES opening, reduced hyoid excursion. Pt was consuming purees and thin liquids at that time, only taking liquids after XRT began, but working back to Dys 2. PMH includes: GERD, esophageal stretching, AAA s/p stent, HTN, HLD, aortic stenosis s/p TAVR, chronic low back pain s/p L4-L5 hemilaminectomy and discectomy, CHF, anemia, tonsillar/lingual ca (s/p transhyoid/transcervical resection of the tonsil and tongue  base with left neck dissection on 12/02/17. 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, requiring two returns to the OR;  scar tissue release and tongue repair with [facility] esophageal dilation) Type of Study: Bedside Swallow Evaluation Previous Swallow Assessment: see HPI Diet Prior to this Study: Regular;Thin liquids Temperature Spikes Noted: No Respiratory Status: Nasal cannula History of Recent Intubation: No Behavior/Cognition: Alert;Cooperative;Pleasant mood Oral Cavity Assessment: Within Functional Limits Oral Care Completed by SLP: No Oral Cavity - Dentition: Dentures, top;Dentures, bottom Vision: Functional for self-feeding Self-Feeding Abilities: Able to feed self Patient Positioning: Other (comment) (EOB) Baseline Vocal Quality: Normal Volitional Cough: Strong Volitional Swallow: Able to elicit    Oral/Motor/Sensory Function Overall Oral Motor/Sensory Function: Mild impairment (baseline L lingual changes, deviates to L)   Ice Chips Ice chips: Not tested   Thin Liquid Thin Liquid: Impaired Presentation: Self Fed;Straw Pharyngeal  Phase Impairments: Multiple swallows;Throat Clearing - Delayed;Cough - Delayed    Nectar Thick Nectar Thick Liquid: Not tested   Honey Thick Honey Thick Liquid: Not tested   Puree Puree: Impaired Presentation: Self  Fed;Spoon Pharyngeal Phase Impairments: Throat Clearing - Delayed;Cough - Delayed   Solid     Solid: Impaired Presentation: Self Fed Pharyngeal Phase Impairments: Throat Clearing - Delayed;Cough - Delayed      Osie Bond., M.A. [place] Office [phone]  Secure chat preferred  08/19/2021,2:31 PM

## 2021-08-19 NOTE — Consult Note (Addendum)
Cardiology Consultation:   Patient ID: [name] MRN: [id number]; DOB: 08/17/1940  Admit date: 08/17/2021 Date of Consult: 08/19/2021  PCP:  Haydee Salter, MD   [facility] Prov[facility] Cardiologist:  Loralie Champagne, MD  Advanced Heart Failure:  Loralie Champagne, MD       Patient Profile:   Brandon [name] is a 81 y.o. 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 Alfredia Ferguson.  History of Present Illness:   Brandon [name] was examined by Nell Range, PA on 07/13/2021 and reported activity was limited secondary to back pain but denied any recent exertional chest pain or progressive dyspnea.  He did have a repeat echocardiogram which showed preserved EF of 65% and his TAVR was functioning normally with a mean gradient of 8 mmHg and mild PVL. Plavix was discontinued given he was a year out from PCI and he was continued on ASA, statin and beta-blocker.  He presented to [facility] ED on 08/17/2021 for evaluation of worsening right-sided back pain along with worsening dyspnea and hypotension. Oxygen saturations were in the 80's upon EMS arrival and had improved to 93% on 4L [place]. Initial labs showed WBC 8.5, Hgb 7.6 (previously 10.1 in 06/2021; occult blood in the ED was positive this admission), platelets 102, Na+ 134, K+ 5.3 and creatinine 2.46 (previously 0.98 in 07/2021).  AST 23 and ALT 15. Lactic acid negative. Procalcitonin elevated to 15.99. BNP 632. EKG showed normal sinus rhythm, heart rate 78  with no acute ST changes. CTA showed no evidence of thoracoabdominal aortic dissection.  Was noted to have coronary calcification, multifocal pulmonary infiltrates, changes consistent with pulmonary arterial hypertension, and stable infrarenal abdominal aortic aneurysm with stable aneurysmal sac at 5.4 cm.  He was admitted for community-acquired pneumonia and started on Rocephin and Azithromycin. Given his AKI, he was also started on IV fluids.  This morning, he developed chest pain after having choked on his pills. Reports this has happened since his prior tonsillar surgery and subsequent radiation. Says his pain was isolated to feeling like the pill was stuck and quickly resolved. No recurrent chest pain since. Says he has not experienced any chest pain at home prior to admission. Did have shortness of breath the day of admission and has a productive cough but says this has occurred since his prior surgery as well and he has post-nasal drip, therefore he did not pay much attention to it. No recent orthopnea, PND or pitting edema. Does have chronic back pain.   Initial Hs troponin was elevated to 276 with repeat at 245 (not checked on admission).  EKG shows sinus tachycardia, heart rate 109 with ST depression along the lateral leads more prominent as compared to prior tracings.  Repeat CXR this AM showed continued right lung airspace disease/consolidation and procalcitonin remains elevated to 5.87.  His creatinine has normalized and is now at 0.86 with K+ at 4.6   Past Medical History:  Diagnosis Date   Abdominal aneurysm ([place])    Arthritis    "  all over" (07/19/2016)   BPH (benign prostatic hypertrophy)    CAD (coronary artery disease)    Chicken pox    Chronic lower back pain    s/p surgical fusion   Depression    Diverticulosis    Esophageal stricture    GERD (gastroesophageal reflux disease)    Hepatitis B 1984   Hiatal hernia    History of radiation therapy 01/16/18- 03/05/18   Left  Tonsil, 66 Gy in 33 fractions to high risk nodal echelons.    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 Burt Knack and Dr Cyndia Bent    Severe aortic stenosis    TIA (transient ischemic attack) 1990s   hx   tonsillar ca dx'd 11/2017   Tubular adenoma of colon 2009    Past Surgical History:  Procedure Laterality Date   ABDOMINAL AORTIC ENDOVASCULAR STENT GRAFT N/A 03/03/2021   Procedure: ABDOMINAL AORTIC ENDOVASCULAR STENT GRAFT;  Surgeon: Waynetta Sandy, MD;  Location: [place];  Service: Vascular;  Laterality: N/A;   ABDOMINAL AORTOGRAM W/LOWER EXTREMITY N/A 04/10/2021   Procedure: ABDOMINAL AORTOGRAM W/LOWER EXTREMITY;  Surgeon: Waynetta Sandy, MD;  Location: [place] [facility];  Service: Cardiovascular;  Laterality: N/A;   BACK SURGERY     CARDIAC CATHETERIZATION     CATARACT EXTRACTION W/ INTRAOCULAR LENS  IMPLANT, BILATERAL Bilateral 01/2012 - 02/2012   CORONARY ATHERECTOMY N/A 04/27/2020   Procedure: CORONARY ATHERECTOMY;  Surgeon: Sherren Mocha, MD;  Location: [place] [facility];  Service: Cardiovascular;  Laterality: N/A;   CORONARY STENT INTERVENTION N/A 04/27/2020   Procedure: CORONARY STENT INTERVENTION;  Surgeon: Sherren Mocha, MD;  Location: [place] [facility];  Service: Cardiovascular;  Laterality: N/A;   DIRECT LARYNGOSCOPY Left 10/09/2017   Procedure: DIRECT LARYNGOSCOPY WITH BOPSY;  Surgeon: Jodi Marble, MD;  Location: [place];  Service: ENT;  Laterality: Left;   ESOPHAGOGASTRODUODENOSCOPY (EGD) WITH ESOPHAGEAL DILATION     "couple times" (07/19/2016)   ESOPHAGOSCOPY Left 10/09/2017   Procedure: ESOPHAGOSCOPY;  Surgeon: Jodi Marble, MD;  Location: [place];  Service: ENT;  Laterality: Left;   INTRAVASCULAR IMAGING/OCT N/A 04/27/2020    Procedure: INTRAVASCULAR IMAGING/OCT;  Surgeon: Sherren Mocha, MD;  Location: [place] [facility];  Service: Cardiovascular;  Laterality: N/A;   INTRAVASCULAR PRESSURE WIRE/FFR STUDY N/A 04/27/2020   Procedure: INTRAVASCULAR PRESSURE WIRE/FFR STUDY;  Surgeon: Sherren Mocha, MD;  Location: [place] [facility];  Service: Cardiovascular;  Laterality: N/A;   IR GASTROSTOMY TUBE MOD SED  01/08/2018   IR THORACENTESIS ASP PLEURAL SPACE W/IMG GUIDE  07/19/2016   LAPAROSCOPIC CHOLECYSTECTOMY  1984   LUMBAR Dent SURGERY  05/1996   L4-5; Dr. Claudean Kinds LAMINECTOMY/DECOMPRESSION MICRODISCECTOMY  10/2002   L3-4. Dr. Sherwood Gambler   MULTIPLE TOOTH EXTRACTIONS  1980s   PARTIAL GLOSSECTOMY  12/02/2017   Dr. Nicolette Bang- [facility]   pharyngoplasty for closure of tingue base defect  12/02/2017   Dr. Nicolette Bang- [place]  07/15/2016   POSTERIOR LUMBAR FUSION  09/1996   Ray cage, L4-5 Dr. Rita Ohara   PROSTATE BIOPSY  ~ 2017   radical tonsillectomy Left 12/02/2017   Dr. Nicolette Bang at [place] N/A 03/31/2020   Procedure: RIGHT HEART CATH AND  CORONARY ANGIOGRAPHY;  Surgeon: Larey Dresser, MD;  Location: [place] [facility];  Service: Cardiovascular;  Laterality: N/A;   RIGID BRONCHOSCOPY Left 10/09/2017   Procedure: RIGID BRONCHOSCOPY;  Surgeon: Jodi Marble, MD;  Location: [place];  Service: ENT;  Laterality: Left;   TEE WITHOUT CARDIOVERSION N/A 06/07/2020   Procedure: TRANSESOPHAGEAL ECHOCARDIOGRAM (TEE);  Surgeon: Sherren Mocha, MD;  Location: [place];  Service: Open Heart Surgery;  Laterality: N/A;   TONSILLECTOMY     TOTAL HIP ARTHROPLASTY Left 12/04/2020   Procedure: TOTAL HIP ARTHROPLASTY ANTERIOR APPROACH;  Surgeon: Rod Can, MD;  Location: [facility];  Service: Orthopedics;  Laterality: Left;   TOTAL HIP ARTHROPLASTY Left 11/2020   TRACHEOSTOMY  12/02/2017   Dr. [facility]ico[facility]- [facility]   ULTRASOUND GUIDANCE FOR VASCULAR ACCESS  Right 06/07/2020   Procedure: ULTRASOUND GUIDANCE FOR VASCULAR ACCESS;  Surgeon: Sherren Mocha, MD;  Location: [place];  Service: Open Heart Surgery;  Laterality: Right;   ULTRASOUND GUIDANCE FOR VASCULAR ACCESS Bilateral 03/03/2021   Procedure: ULTRASOUND GUIDANCE FOR VASCULAR ACCESS, BILATERAL FEMORAL ARTERIES;  Surgeon: Waynetta Sandy, MD;  Location: [place];  Service: Vascular;  Laterality: Bilateral;   VASCULAR SURGERY       Home Medications:  Prior to Admission medications   Medication Sig Start Date End Date Taking? Authorizing Provider  acetaminophen (TYLENOL) 500 MG tablet Take 1,000 mg by mouth every 8 (eight) hours as needed for mild pain.    [provider]  albuterol (VENTOLIN HFA) 108 (90 Base) MCG/ACT inhaler Inhale 2 puffs into the lungs every 6 (six) hours as needed for wheezing or shortness of breath. 01/24/21   Haydee Salter, MD  alendronate (FOSAMAX) 70 MG tablet Take 1 tablet (70 mg total) by mouth once a week. Take with a full glass of water on an empty stomach. Patient taking differently: Take 70 mg by mouth every Tuesday. Take with a full glass of water on an empty stomach. 01/26/21   Bo Merino, MD  allopurinol (ZYLOPRIM) 100 MG tablet TAKE 2 TABLETS(200 MG) BY MOUTH DAILY Patient taking differently: Take 200 mg by mouth daily. 07/12/21   Ofilia Neas, PA-C  amLODipine (NORVASC) 10 MG tablet TAKE 1 TABLET(10 MG) BY MOUTH DAILY Patient taking differently: Take 10 mg by mouth daily. 06/26/21   Larey Dresser, MD  aspirin EC 81 MG tablet Take 81 mg by mouth daily. Swallow whole.    [provider]  atorvastatin (LIPITOR) 80 MG tablet Take 1 tablet (80 mg total) by mouth daily. 03/21/21   Larey Dresser, MD  carvedilol (COREG) 12.5 MG tablet Take 1 tablet (12.5 mg total) by mouth 2 (two) times daily. 03/24/21 03/24/22  Larey Dresser, MD  Cholecalciferol 25 MCG (1000 UT) tablet Take 1,000 Units by mouth daily.    [provider]   citalopram (CELEXA) 20 MG tablet TAKE 1 TABLET(20 MG) BY MOUTH DAILY Patient taking differently: Take 20 mg by mouth daily. 01/20/21   Lovorn, Jinny Blossom, MD  doxycycline (VIBRA-TABS) 100 MG tablet Take 1 tablet (100 mg total) by mouth 2 (two) times daily. Patient not taking: Reported on 08/19/2021 07/18/21   Haydee Salter, MD  ezetimibe (ZETIA) 10 MG tablet Take 1 tablet (10 mg total) by mouth daily. 05/25/21 05/25/22  Larey Dresser, MD  fluticasone (FLONASE) 50 MCG/ACT nasal spray SHAKE LIQUID AND USE 2 SPRAYS IN EACH NOSTRIL DAILY AS NEEDED FOR RHINITIS OR ALLERGIES Patient taking differently: Place 2 sprays into  both nostrils daily as needed for rhinitis or allergies. 11/14/20   Haydee Salter, MD  furosemide (LASIX) 20 MG tablet Take 20 mg by mouth daily.    [provider]  gabapentin (NEURONTIN) 300 MG capsule Take 300 mg by mouth 3 (three) times daily. 07/04/21   [provider]  hydrOXYzine (VISTARIL) 25 MG capsule Take 1 capsule (25 mg total) by mouth every 8 (eight) hours as needed. 06/23/21   Haydee Salter, MD  ipratropium (ATROVENT) 0.03 % nasal spray Place 2 sprays into both nostrils every 12 (twelve) hours. Patient taking differently: Place 2 sprays into both nostrils 2 (two) times daily as needed for rhinitis (allergies). 01/24/21   Haydee Salter, MD  lisinopril (ZESTRIL) 20 MG tablet Take 20 mg by mouth daily.    [provider]  ondansetron (ZOFRAN-ODT) 4 MG disintegrating tablet Take 1 tablet (4 mg total) by mouth every 8 (eight) hours as needed for nausea or vomiting. 06/20/21   Haydee Salter, MD  Oxycodone HCl 10 MG TABS Take 1 tablet (10 mg total) by mouth every 6 (six) hours as needed. Patient taking differently: Take 10 mg by mouth every 6 (six) hours as needed (pain). 08/02/21   Lovorn, Jinny Blossom, MD  pantoprazole (PROTONIX) 40 MG tablet Take 1 tablet (40 mg total) by mouth daily. 06/30/21 09/28/21  [place] Donnamarie Poag, DO  potassium chloride (KLOR-CON) 10  MEQ tablet Take 2 tablets (20 mEq total) by mouth 2 (two) times daily. 05/01/21   [place], Maricela Bo, FNP  predniSONE (DELTASONE) 1 MG tablet TAKE 2 TABLET BY MOUTH DAILY ALONG WITH 5 MG FOR A TOTAL OF 7 MG DAILY FOR 2 MONTHS THEN REDUCE TO 6 MG DAILY 08/02/21   Bo Merino, MD  predniSONE (DELTASONE) 20 MG tablet Take 2 tablets (40 mg total) by mouth daily with breakfast. Patient not taking: Reported on 08/19/2021 07/18/21   Haydee Salter, MD  predniSONE (DELTASONE) 5 MG tablet Take 1 tablet (5 mg total) by mouth daily with breakfast. 05/18/21   Bo Merino, MD  tamsulosin (FLOMAX) 0.4 MG CAPS capsule Take 0.4 mg by mouth in the morning and at bedtime. 12/23/18   [provider]  thiamine 100 MG tablet Take 1 tablet (100 mg total) by mouth daily. 06/30/21 09/28/21  [place] Donnamarie Poag, DO  Tiotropium Bromide Monohydrate (SPIRIVA RESPIMAT) 2.5 MCG/ACT AERS Inhale 2 puffs into the lungs daily. Patient taking differently: Inhale 2 puffs into the lungs daily as needed (shortness of breath). 01/24/21   Haydee Salter, MD  traMADol (ULTRAM) 50 MG tablet Take 2 tablets (100 mg total) by mouth 2 (two) times daily. Patient taking differently: Take 100 mg by mouth every 12 (twelve) hours as needed for moderate pain. 06/02/21   Lovorn, Jinny Blossom, MD  vitamin B-12 (CYANOCOBALAMIN) 1000 MCG tablet Take 1 tablet (1,000 mcg total) by mouth daily. 06/29/21 09/27/21  [place] Eric J, DO    Inpatient Medications: Scheduled Meds:  aspirin EC  81 mg Oral Daily   atorvastatin  80 mg Oral Daily   carvedilol  12.5 mg Oral BID   citalopram  20 mg Oral Daily   ezetimibe  10 mg Oral Daily   guaiFENesin  1,200 mg Oral BID   ipratropium-albuterol  3 mL Nebulization Q6H   methylPREDNISolone (SOLU-MEDROL) injection  80 mg Intravenous BID   pantoprazole  40 mg Oral QHS   vitamin B-12  1,000 mcg Oral Daily   Continuous Infusions:  azithromycin 500 mg (  08/18/21 2244)   cefTRIAXone (ROCEPHIN)  IV 1 g (08/18/21  2143)   PRN Meds: acetaminophen **OR** acetaminophen, albuterol, fentaNYL (SUBLIMAZE) injection, naLOXone (NARCAN)  injection, nitroGLYCERIN, oxyCODONE, traMADol  Allergies:    Allergies  Allergen Reactions   Celebrex [Celecoxib] Hives and Itching   Tape     Tears skin. Prefers paper tape Please    Social History:   Social History   Socioeconomic History   Marital status: Married    Spouse name: etta   Number of children: 2   Years of education: Not on file   Highest education level: Not on file  Occupational History   Occupation: retired    Fish farm manager: RETIRED    Comment: disabled due to back problems  Tobacco Use   Smoking status: Former    Packs/day: 1.50    Years: 50.00    Total pack years: 75.00    Types: Cigarettes    Quit date: 09/05/2017    Years since quitting: 3.9   Smokeless tobacco: Never   Tobacco comments:    smoked less than 1 ppd for 40+ years;   Vaping Use   Vaping Use: Never used  Substance and Sexual Activity   Alcohol use: Not Currently   Drug use: Yes    Types: Oxycodone    Comment: has prescription   Sexual activity: Not Currently  Other Topics Concern   Not on file  Social History Narrative   ** Merged History Encounter **       Married (92rd), [place]. 2 children from 1st marriage, 4 step children.    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       Cell # [phone]   Social Determinants of Health   Financial Resource Strain: Low Risk  (06/07/2021)   Overall Financial Resource Strain (CARDIA)    Difficulty of Paying Living Expenses: Not hard at all  Food Insecurity: No Food Insecurity (06/07/2021)   Hunger Vital Sign    Worried About Running Out of Food in the Last Year: Never true    [place] in the Last Year: Never true  Transportation Needs: No Transportation Needs (06/07/2021)   PRAPARE - Hydrologist (Medical): No     Lack of Transportation (Non-Medical): No  Physical Activity: Inactive (06/07/2021)   Exercise Vital Sign    Days of Exercise per Week: 0 days    Minutes of Exercise per Session: 0 min  Stress: No Stress Concern Present (06/07/2021)   Early    Feeling of Stress : Not at all  Social Connections: Moderately Integrated (06/07/2021)   Social Connection and Isolation Panel [NHANES]    Frequency of Communication with Friends and Family: Twice a week    Frequency of Social Gatherings with Friends and Family: Twice a week    Attends Religious Services: More than 4 times per year    Active Member of [employer] or Organizations: No    Attends Archivist Meetings: Never    Marital Status: Married  Human resources officer Violence: Not At Risk (06/07/2021)   Humiliation, Afraid, Rape, and Kick questionnaire    Fear of Current or Ex-Partner: No    Emotionally Abused: No    Physically Abused: No    Sexually Abused: No    Family History:    Family History  Problem Relation  Age of Onset   Heart disease Father 67       Living   Coronary artery disease Father        CABG   Alzheimer's disease Mother 40       Deceased   Arthritis Mother    Aneurysm Brother    Stomach cancer Maternal Uncle    Brain cancer Maternal Aunt        x2   Obesity Daughter        Had Bypass Sx     ROS:  Please see the history of present illness.   All other ROS reviewed and negative.     Physical Exam/Data:   Vitals:   08/19/21 0733 08/19/21 0819 08/19/21 0845 08/19/21 1100  BP: (!) 169/75 (!) 142/65  136/68  Pulse: 98 100  91  Resp: 20 (!) 25  16  Temp: 97.7 F (36.5 C)   97.8 F (36.6 C)  TempSrc: Oral   Oral  SpO2: 99% 98% 97% 94%  Weight:      Height:        Intake/Output Summary (Last 24 hours) at 08/19/2021 1220 Last data filed at 08/19/2021 0500 Gross per 24 hour  Intake 1043.35 ml  Output 950 ml  Net 93.35 ml      08/19/2021     5:52 AM 08/18/2021    3:34 PM 08/18/2021    8:35 AM  Last 3 Weights  Weight (lbs) 175 lb 4.3 oz 181 lb 3.5 oz 166 lb 7.2 oz  Weight (kg) 79.5 kg 82.2 kg 75.5 kg     Body mass index is 27.86 kg/m.  General: Pleasant male appearing in no acute distress. HEENT: normal Neck: no JVD Vascular: No carotid bruits; Distal pulses 2+ bilaterally Cardiac:  normal S1, S2; RRR; no murmur  Lungs: scattered rhonchi. No wheezing. Abd: soft, nontender, no hepatomegaly  Ext: no pitting edema Musculoskeletal:  No deformities, BUE and BLE strength normal and equal Skin: warm and dry  Neuro:  CNs 2-12 intact, no focal abnormalities noted Psych:  Normal affect   EKG:  The EKG was personally reviewed and demonstrates: NSR, heart rate 78 with no acute ST changes.  Telemetry:  Telemetry was personally reviewed and demonstrates: Sinus tach this AM with HR in the low-100's to 120's. Now NSR with HR in the 70's to 80's.   Relevant CV Studies:  R/LHC: 03/2020 1. Low filling pressures.  2. Preserved cardiac output.  3. Known occluded RCA with robust collaterals from LAD.  4. 95% calcified proximal LAD stenosis (LAD provides RCA collaterals).  5. 60-70% proximal LCx stenosis.    Patient has known severe AS (from echo).  I reviewed films with Drs Angelena Form and Burt Knack.  PCI-TAVR would be an option, would likely need atherectomy to address LAD and then PCI LCx.  SAVR-CABG would also be an option, and may be safer in the long term as any compromise to an LAD stent would affect both LAD and RCA territory. Patient will be seen by cardiac surgeon and get scans for TAVR planning.    Coronary Atherectomy: 04/2020 1.  Successful orbital atherectomy and stenting of severe calcific stenosis in the proximal LAD, procedure guided by OCT imaging, treated with a 4.0 x 12 mm resolute Onyx DES 2.  Moderate left circumflex stenosis with negative pressure wire analysis.   Plan: Overnight observation, likely discharge home  tomorrow as long as no complications arise.  Continue antiplatelet therapy with aspirin and clopidogrel minimum 6 months,  plan to proceed with TAVR in staged fashion.  Echocardiogram: 07/2021 IMPRESSIONS     1. Left ventricular ejection fraction, by estimation, is 65 to 70%. The  left ventricle has normal function. The left ventricle has no regional  wall motion abnormalities. Left ventricular diastolic parameters are  consistent with Grade I diastolic  dysfunction (impaired relaxation).   2. Right ventricular systolic function is normal. The right ventricular  size is normal.   3. Left atrial size was moderately dilated.   4. The mitral valve is normal in structure. No evidence of mitral valve  regurgitation. No evidence of mitral stenosis.   5. Mild TAVR perivalvular leak, 29 mm Edwards Sapien 3 THV via the  subclavian approach on 06/07/20. The aortic valve has been  repaired/replaced. Aortic valve regurgitation is not visualized. No aortic  stenosis is present. There is a 29 mm Sapien  prosthetic (TAVR) valve present in the aortic position. Procedure Date:  06/07/20. Echo findings are consistent with perivalvular leak of the aortic  prosthesis. Aortic valve mean gradient measures 8.0 mmHg. Aortic valve  Vmax measures 2.11 m/s.   6. Aortic dilatation noted. There is mild dilatation of the ascending  aorta, measuring 40 mm.   7. The inferior vena cava is normal in size with greater than 50%  respiratory variability, suggesting right atrial pressure of 3 mmHg.   Laboratory Data:  High Sensitivity Troponin:   Recent Labs  Lab 08/19/21 0941  TROPONINIHS 276*     Chemistry Recent Labs  Lab 08/17/21 1811 08/17/21 1820 08/18/21 0239 08/18/21 0518 08/18/21 0548 08/19/21 0026  NA 134* 133* 134*  --  135 137  K 5.3* 5.3* 5.0  --  5.0 4.6  CL 104 103 104  --   --  110  CO2 22  --  22  --   --  20*  GLUCOSE 94 82 88  --   --  192*  BUN 63* 69* 59*  --   --  36*  CREATININE 2.46*  2.60* 1.78*  --   --  0.86  CALCIUM 7.1*  --  7.5*  --   --  8.4*  MG  --   --  1.3* 1.4*  --  1.6*  GFRNONAA 26*  --  39*  --   --  >60  ANIONGAP 8  --  8  --   --  7    Recent Labs  Lab 08/17/21 1811 08/18/21 0239 08/19/21 0026  PROT 4.4* 5.0* 5.0*  ALBUMIN 2.3* 2.5* 2.5*  AST '23 30 21  '$ ALT '15 17 16  '$ ALKPHOS 71 91 89  BILITOT 0.8 0.6 0.6   Lipids No results for input(s): "CHOL", "TRIG", "HDL", "LABVLDL", "LDLCALC", "CHOLHDL" in the last 168 hours.  Hematology Recent Labs  Lab 08/17/21 1811 08/17/21 1820 08/18/21 0518 08/18/21 0548 08/18/21 1642 08/19/21 0026  WBC 8.5  --  7.1  --   --  5.6  RBC 2.67*  --  2.81*  2.77*  --   --  2.87*  HGB 7.6*   < > 8.0* 8.2* 8.5* 8.1*  HCT 24.4*   < > 25.7* 24.0* 26.3* 25.2*  MCV 91.4  --  91.5  --   --  87.8  MCH 28.5  --  28.5  --   --  28.2  MCHC 31.1  --  31.1  --   --  32.1  RDW 18.0*  --  18.3*  --   --  17.5*  PLT 102*  --  83*  --   --  75*   < > = values in this interval not displayed.   Thyroid No results for input(s): "TSH", "FREET4" in the last 168 hours.  BNP Recent Labs  Lab 08/18/21 0518  BNP 632.7*    DDimer No results for input(s): "DDIMER" in the last 168 hours.   Radiology/Studies:  DG CHEST PORT 1 VIEW  Result Date: 08/19/2021 CLINICAL DATA:  Shortness of breath EXAM: PORTABLE CHEST 1 VIEW COMPARISON:  08/19/2021 CT and prior studies FINDINGS: Cardiomediastinal silhouette is unchanged and evidence of the aortic valve replacement again noted. Airspace disease/consolidation within the mid-LOWER RIGHT lung again identified. The LEFT lung appears clear. No pneumothorax or pleural effusion identified. No acute bony abnormalities are noted. IMPRESSION: Little significant change with continued RIGHT lung airspace disease/consolidation. Electronically Signed   By: Margarette Canada M.D.   On: 08/19/2021 08:08   CT Angio Chest/Abd/Pel for Dissection W and/or Wo Contrast  Result Date: 08/17/2021 CLINICAL DATA:  Chest  pain or back pain, aortic dissection suspected EXAM: CT ANGIOGRAPHY CHEST, ABDOMEN AND PELVIS TECHNIQUE: Non-contrast CT of the chest was initially obtained. Multidetector CT imaging through the chest, abdomen and pelvis was performed using the standard protocol during bolus administration of intravenous contrast. Multiplanar reconstructed images and MIPs were obtained and reviewed to evaluate the vascular anatomy. RADIATION DOSE REDUCTION: This exam was performed according to the departmental dose-optimization program which includes automated exposure control, adjustment of the mA and/or kV according to patient size and/or use of iterative reconstruction technique. CONTRAST:  6m OMNIPAQUE IOHEXOL 350 MG/ML SOLN COMPARISON:  CTA abdomen pelvis 03/24/2021, CT chest abdomen pelvis 12/03/2020 FINDINGS: CTA CHEST FINDINGS Cardiovascular: Extensive coronary artery calcification predominantly within the left anterior descending coronary artery. Transcatheter aortic valve replacement has been performed. Global cardiac size within normal limits. No pericardial effusion. Central pulmonary arteries are enlarged in keeping with changes of pulmonary arterial hypertension, stable since prior examination. Moderate atherosclerotic calcification is seen within the thoracic aorta. No aortic aneurysm, intramural hematoma, or dissection. Mediastinum/Nodes: Visualized thyroid is unremarkable. No pathologic thoracic adenopathy. The esophagus is unremarkable. Lungs/Pleura: Airspace infiltrate is seen throughout the right lung with focal consolidation within the superior segment of the right lower lobe, in keeping with acute infection in the appropriate clinical setting. Associated bronchial wall thickening in keeping with airway inflammation. No central obstructing mass. Small amount of layering debris noted within the right mainstem bronchus and bronchus intermedius. Minimal patchy infiltrate also noted within the left upper lobe. No  pneumothorax or pleural effusion. Musculoskeletal: No acute bone abnormality. No lytic or blastic bone lesion. Review of the MIP images confirms the above findings. CTA ABDOMEN AND PELVIS FINDINGS VASCULAR Aorta: Fusiform infrarenal abdominal aortic aneurysm has undergone endovascular repair utilizing a bifurcated stent graft with infrarenal proximal fixation and bilateral common iliac distal fixation. The aneurysm sac is unchanged in size when measured in identical fashion measuring 5.4 x 4.4 cm at axial image # 173/7 (measuring identically on image # 66/7 of previous examination). The repaired segment appears patent. Superimposed extensive atherosclerotic calcification. No periaortic inflammatory change or fluid collections are identified. Celiac: Less than 50% stenosis of the origin, unchanged. Distally widely patent. Variant anatomy with the celiac axis supplying only the left gastric artery and splenic artery. SMA: Replaced common hepatic artery. Less than 50% stenosis of the origin. Distally widely patent. No aneurysm or dissection. Renals: Dual renal arteries bilaterally with accessory lower pole renal arteries noted.  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 Normal vascular morphology. No aneurysm or dissection. 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 Distally widely patent. Inflow: No evidence of hemodynamically significant stenosis involving the lower extremity arterial inflow bilaterally. Internal iliac arteries are stenotic at their origins, but are patent bilaterally. Veins: No obvious venous abnormality within the limitations of this arterial phase study. Review of the MIP images confirms the above findings. NON-VASCULAR Hepatobiliary: No focal liver abnormality is seen. Status post cholecystectomy. No biliary  dilatation. Pancreas: Unremarkable Spleen: Unremarkable Adrenals/Urinary Tract: The adrenal glands are unremarkable. The kidneys are normal. Bladder is unremarkable. Stomach/Bowel: Moderate descending and sigmoid colonic diverticulosis without superimposed acute inflammatory change. The stomach, small bowel, and large bowel are otherwise unremarkable. Appendix normal. No free intraperitoneal gas or fluid. Lymphatic: No pathologic adenopathy within the abdomen and pelvis. Reproductive: Mild prostatic enlargement Other: No abdominal wall hernia Musculoskeletal: Left total hip arthroplasty and L4-5 hemilaminectomies and lumbar discectomy with interbody bone cage placement has been performed. No definite bridging callus identified at L4-5. No acute bone abnormality. No lytic or blastic bone lesion. Review of the MIP images confirms the above findings. IMPRESSION: 1. No evidence of thoracoabdominal aortic dissection. 2. Extensive coronary artery calcification. Status post transcatheter aortic valve replacement. 3. Multifocal pulmonary infiltrate, most severe within the superior segment of the right lower lobe, in keeping with acute infection in the appropriate clinical setting. Associated bronchial wall thickening in keeping with airway inflammation. No central obstructing mass identified. 4. Stable changes of pulmonary arterial hypertension. 5. Stable infrarenal abdominal aortic aneurysm status post endovascular repair. Stable aneurysm sac size at 5.4 cm. 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 6. Bilateral renal artery hemodynamically significant stenoses, involving the main right renal artery and both the main as well as accessory lower polar left renal arteries. 7. Moderate distal colonic diverticulosis without superimposed acute inflammatory change. 8. Postsurgical changes of L4-5 hemilaminectomies and lumbar discectomy with interbody bone cage placement. No definite bridging callus identified at L4-5. Electronically Signed    By: Fidela Salisbury M.D.   On: 08/17/2021 23:13     Assessment and Plan:   1. Atypical Chest Pain/Elevated Troponin - He developed chest pain this morning after becoming choked on his pills and says this happens frequently at home ever since undergoing prior tonsillar surgery and radiation. His pain quickly resolved and he denies any recurrence since. He denies any exertional chest pain prior to admission as well. - Initial troponin was elevated to 276 with repeat at 245. EKG did show some ST depression along the lateral leads which was more prominent as compared to prior tracings but heart rate was elevated, so possibly rate-related. A repeat limited echocardiogram is pending. If this is reassuring, would not anticipate further ischemic testing this admission given his atypical symptoms. Suspect his enzyme elevation is secondary to demand ischemia in the setting of acute hypoxic respiratory failure due to PNA. If limited echocardiogram is reassuring, could likely stop Heparin and would not want to continue long-term given his anemia and heme-positive stools.   2. CAD - He is s/p cath in 04/2020 showing CTO RCA with collaterals present but did have 95% LAD stenosis treated with atherectomy/DES and medical management recommended of moderate LCx stenosis. - He denies any recent exertional chest pain prior to admission. He does report dyspnea in the setting  of PNA. Limited echocardiogram pending as outlined above. - Continue current medical therapy with ASA 81 mg daily, Atorvastatin 80 mg daily, Coreg 12.5 mg twice daily and Zetia 10 mg daily.  3. Severe AS - He is s/p TAVR in 06/2020. Recent echocardiogram last month showed mild perivalvular leak with no evidence of stenosis. Repeat limited echocardiogram pending.  4. AAA  - He is s/p endovascular stent graft in 02/2021 and ultimately required endovascular aneurysm repair of the iliac artery in 04/2021. CTA this admission showed no evidence of  thoracoabdominal aortic dissection. Was noted to have a stable infrarenal abdominal aortic aneurysm with aneurysmal sac at 5.4 cm and suspected type IIa endoleak. - Followed by [facility] as an outpatient.   5. HTN - BP was stable at 136/68 on most recent check. He remains on Coreg 12.5 mg twice daily. He was previously on Amlodipine but says he stopped this at home due to associated dizziness as symptoms improved off the medication. Was on Lisinopril 20 mg daily at home and would resume prior to discharge.   6. AKI - Creatinine was elevated to 2.46 on admission and has improved to 0.86 today. Given his elevated BNP of 632, agree with stopping IV fluids. He does not appear volume overloaded on examination and was on Lasix 20 mg daily prior to admission. Can likely resume tomorrow.  7. Hypomagnesemia - Mg at 1.6 this AM. Replacement has been ordered by the admitting team.   8. Acute Hypoxic Respiratory Failure in the setting of PNA - He remains on Azithromycin and Ceftriaxone. Management per the admitting team.   For questions or updates, please contact [place] Please consult [email or web address] for contact info under    Signed, Erma Heritage, PA-C  08/19/2021 12:20 PM  Attending note Patient seen an disucssed with PA Ahmed Prima, I agree with her documentation. 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 Admitted with AKI, pneumonia with sepsis. This morning after taking AM pills developed chest pain, cardiology consulted    K 4.6 Cr 0.86 (down from 2.46) BUN 36 Hgb 8.1 Plt 75 Mg 1.6  Trop 276-->245 EKG some anterolateral ST depressions 07/2021 echo: LVEF 65-70%, no WMAs, grade I DD  Chest pain after taking pills, appears has had similar pattern in the past likely related to his history of throat cancer and prior radiation. Trop trending down in  setting of sepsis, pneumonia, AKI on admission. F/u limited echo. Likely demand ischemia, in general poor cath candidate given anemia and heme +stools, thrombocytopenia. Would go ahead and d/c heparin at this time given downtrending trop and pain free.   Carlyle Dolly MD

## 2021-08-19 NOTE — Progress Notes (Signed)
PROGRESS NOTE    Brandon [name]  MCR:[id number] DOB: [birth year] DOA: 08/17/2021 PCP: Haydee Salter, MD   Brief Narrative:  The patient is a 81 year old overweight Caucasian male with a past medical history significant for but not limited to abdominal aortic aneurysm status post endovascular stent graft in January 2023, GERD, hypertension, hyperlipidemia, history of severe aortic stenosis status post TAVR, history of chronic low back pain status post L4-L5 hemilaminectomy and discectomy with continued severe back pain, chronic diastolic CHF, anemia chronic disease with hemoglobin of 8-10 as well as other comorbidities who presented to the hospital with complaints of worsening low back pain.  Patient states that he was seen at [facility] physician and he was scheduled for an outpatient MRI but states that he had worsening back pain on the right side and became extremely weak.  He has been taking oxycodone 10 mg every 6 scheduled and noted that he continued to have significant pain.  He also states that he had 2 to 3-day period of nonproductive cough associated with some shortness of breath with no orthopnea, PND or worsening peripheral edema.  He ate new was noted to be hospitalized in May 2023 for AKI with mild hyperkalemia and at that time his serum creatinine was noted to be 7.43 and improved back to baseline of approximately 1.  Patient currently does not require any supplemental oxygen and only takes Breo and albuterol as needed given that he has a 75-year pack history of smoking.  Coming to the ED he was found to have some hypoxia and tachypnea and had to be placed on 4 L of supplemental oxygen via nasal cannula.  Labs showed an elevated BUN/creatinine with an AKI again.  CT of the chest abdomen pelvis dissection protocol was done to evaluate the patient's low blood pressure in the setting of his abdominal aortic aneurysm and showed a stable infrarenal abdominal aortic aneurysm status post endovascular  repair.  Also did show evidence of multifocal pulmonary infiltrates consistent with pneumonia and associated bronchial wall thickening suggestive of airway inflammation with no evidence of edema, pleural effusion or pneumothorax.  A CT of the chest abdomen pelvis also showed evidence of bilateral renal artery stenosis and moderate colonic diverticulosis without evidence of diverticulitis.  He was given IV fluid boluses and admitted for further management of his AKI with hyperkalemia as well as acute respiratory failure with hypoxia and hypotension in the setting of sepsis secondary to community-acquired pneumonia.  His O2 has been weaned to 2 L and he is improving and chest x-ray shows right lung airspace disease.  Today started having some chest pain after he swallowed some pills but his troponin was elevated so cardiology was consulted and initially recommended heparin drip but this is now stopped.  Troponin is trending down and cardiology recommending a limited echo.  Patient is a poor cath candidate given his anemia and heme positive stools along with side thrombocytopenia.    Assessment and Plan:  Acute respiratory failure with hypoxia in the setting of community-acquired pneumonia Severe Sepsis 2/2 to CAP with associated Hypotension -Patient has had new onse cough with associated shortness of breath and CTA of the chest showed radiographic evidence of multilobular infiltrates consistent with pneumonia -Met severe Sepsis Criteria given that he had a significant AKI, New O2 Requirement and was Tachycardic and Tachypenic as a source of infection with the multifocal pneumonia -Patient has not been febrile or had any leukocytosis -Blood cultures x2 collected in the ED -Patient was  noted to be saturating in the mid to high 80s on room air and had to be placed on supplemental oxygen with improvement of his saturations to the mid 90s -He was initiated on antibiotics with IV ceftriaxone and  azithromycin -SpO2: 92 % O2 Flow Rate (L/min): 6 L/min FiO2 (%): 50 % -ABG Labs (Brief)          Component Value Date/Time    PHART 7.336 (L) 12/04/2020 1038    PCO2ART 52.1 (H) 12/04/2020 1038    PO2ART 55 (L) 12/04/2020 1038    HCO3 23.1 08/18/2021 0548    TCO2 24 08/18/2021 0548    ACIDBASEDEF 1.0 08/18/2021 0548    O2SAT 99 08/18/2021 0548      -Significant amount of oxygen but this is being weaned and he was on 2 L ambulatory -Continuous pulse oximetry maintain O2 saturation greater 90% -Continue supplemental oxygen via nasal cannula and wean O2 as tolerated -Patient will need an ambulatory home O2 screen as well as repeat chest x-ray prior to discharge -Given his long-term smoking he could have a component of COPD -Patient has been initiated on scheduled nebulizer treatments and as needed albuterol nebulizer and was also given Solu-Medrol -Given IV fluid hydration and fluid boluses -Check Streph Pnuemoniae Ur Ag and this was negative -Blood cultures x2 pending and showing no growth to date currently -MRSA PCR via NexGen is not detected -Procalcitonin level was elevated at 15.99 and now trended down to 5.87 -We will need incentive spirometry, guaifenesin 1200 mg p.o. twice daily as well as a flutter valve -C/w IV Solumedrol 80 mg BID and start weaning and has now gone to 60 twice daily   AKI Metabolic Acidosis -In the setting of Infection  -Patient's BUN/Cr went from 69/2.60 -> 59/1.78 and is now improved to 36/0.86 -IV fluid hydration stop -Holding nephrotoxic medications including lisinopril Lasix -Patient has a slight metabolic acidosis with a CO2 of 20, anion gap of 7, chloride level 110 -Avoid nephrotoxic medications, contrast dyes, hypotension and dehydration and will need to renally adjust medications -Continue To monitor and trend and repeat CMP in the AM   Hyperkalemia -Patient's K+ was 5.3 and is now improved to 4.6 -In the setting of AKI -Improving   -Continue to monitor and trend on repeat CMP in the the a.m.   Thrombocytopenia -Patient's platelet count went from 102 and is now 83 -Continue to monitor and trend and repeat CBC in the a.m.   Acute on chronic normocytic anemia -Patient's hemoglobin/hematocrit went from 7.5/22.0 -> 8.2/24.0 and repeat was 8.5/26.3 yesterday and is now slightly lower at 8.1/25.2 -Anemia panel was checked and showed an iron level of 10, U IBC 228, TIBC of 238, saturation ratios of 4%, ferritin level 120 -FOBT is positive but he is not really having signs and symptoms of bleeding -Continue monitor signs and symptoms of bleeding; no overt bleeding noted -Need to discuss with gastroenterology -Repeat CBC in a.m.   Chronic diastolic CHF -Continue monitor strict I's and O's and daily weight -BNP was 632.7 -IV fluid hydration is now stopped -Repeat chest x-ray in a.m.   Hyperlipidemia -Continue with Ezetimibe 10 mg p.o. daily and atorvastatin 80 mg p.o. daily   Depression and Anxiety -Continue with citalopram 20 mg p.o. daily   Hypomagnesemia -Patient's mag level is now 1.6 -Replete with IV mag sulfate 2 g -Continue to monitor and replete as necessary -Repeat mag level in a.m.  Chest pain -Describes typical symptoms but had after he  swallowed pills -EKG done and troponin x2 was ordered and was slightly elevated to 76 but trended down to 245 -Chest pain resolved spontaneously and cardiology was consulted and initially recommended heparin drip but this was discontinued -Cardiology following and recommending a limited echo   Chronic low back pain -Resumed his home oxycodone and tramadol -Also has IV fentanyl 50 mcg every 2 as needed for severe pain as well as Narcan if needed -Was post to be scheduled for outpatient MRI but may be able to be done in house if he continues to have significant back pain pending PT OT evaluations   GERD and GI prophylaxis -Continue with Pantoprazole 40 mg p.o.  nightly   Aortic Aneurysm -CTA done and showed "Stable infrarenal abdominal aortic aneurysm status post endovascular repair. Stable aneurysm sac size at 5.4 cm. 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  Cardiology following recommended limited echo   Consultants:  Cardiology  Procedures:  Limited echo  Antimicrobials:  Anti-infectives (From admission, onward)    Start     Dose/Rate Route Frequency Ordered Stop   08/18/21 0230  azithromycin (ZITHROMAX) 500 mg in sodium chloride 0.9 % 250 mL IVPB        500 mg 250 mL/hr over 60 Minutes Intravenous Daily at bedtime 08/18/21 0214     08/18/21 0230  cefTRIAXone (ROCEPHIN) 1 g in sodium chloride 0.9 % 100 mL IVPB        1 g 200 mL/hr over 30 Minutes Intravenous Daily at bedtime 08/18/21 0214          Subjective: Seen and examined at bedside and thinks he is doing little bit better.  Had some chest pain earlier this morning which spontaneously resolved but states that it happened after swallowing pills.  No nausea or vomiting.  Denies any lightheadedness or dizziness but remains on significant mount of oxygen.  No other concerns or complaints at this time.  Objective: Vitals:   08/19/21 1100 08/19/21 1431 08/19/21 1440 08/19/21 1500  BP: 136/68   (!) 142/68  Pulse: 91   77  Resp: 16   18  Temp: 97.8 F (36.6 C)   97.8 F (36.6 C)  TempSrc: Oral   Oral  SpO2: 94% 95% 97% 92%  Weight:      Height:        Intake/Output Summary (Last 24 hours) at 08/19/2021 1914 Last data filed at 08/19/2021 1500 Gross per 24 hour  Intake 1283.35 ml  Output 950 ml  Net 333.35 ml   Filed Weights   08/18/21  0835 08/18/21 1534 08/19/21 0552  Weight: 75.5 kg 82.2 kg 79.5 kg    Examination: Physical Exam:  Constitutional: WN/WD elderly Caucasian male currently no acute distress Respiratory: Diminished to auscultation bilaterally with coarse breath sounds, no wheezing, rales, rhonchi or crackles. Normal respiratory effort and patient is not tachypenic. No accessory muscle use.  Cardiovascular: RRR, no murmurs / rubs / gallops. S1 and S2 auscultated. No extremity edema. Abdomen: Soft, non-tender, distended secondary body habitus. Bowel sounds positive.  GU: Deferred. Musculoskeletal: No clubbing / cyanosis of digits/nails. No joint deformity upper and lower extremities.  Neurologic: CN 2-12 grossly intact with no focal deficits. Romberg  sign cerebellar reflexes not assessed.  Psychiatric: Normal judgment and insight. Alert and oriented x 3. Normal mood and appropriate affect.   Data Reviewed: I have personally reviewed following labs and imaging studies  CBC: Recent Labs  Lab 08/17/21 1811 08/17/21 1820 08/18/21 0518 08/18/21 0548 08/18/21 1642 08/19/21 0026  WBC 8.5  --  7.1  --   --  5.6  NEUTROABS 7.6  --  6.3  --   --  5.1  HGB 7.6* 7.5* 8.0* 8.2* 8.5* 8.1*  HCT 24.4* 22.0* 25.7* 24.0* 26.3* 25.2*  MCV 91.4  --  91.5  --   --  87.8  PLT 102*  --  83*  --   --  75*   Basic Metabolic Panel: Recent Labs  Lab 08/17/21 1811 08/17/21 1820 08/18/21 0239 08/18/21 0518 08/18/21 0548 08/19/21 0026  NA 134* 133* 134*  --  135 137  K 5.3* 5.3* 5.0  --  5.0 4.6  CL 104 103 104  --   --  110  CO2 22  --  22  --   --  20*  GLUCOSE 94 82 88  --   --  192*  BUN 63* 69* 59*  --   --  36*  CREATININE 2.46* 2.60* 1.78*  --   --  0.86  CALCIUM 7.1*  --  7.5*  --   --  8.4*  MG  --   --  1.3* 1.4*  --  1.6*  PHOS  --   --   --   --   --  2.6   GFR: Estimated Creatinine Clearance: 70.9 mL/min (by C-G formula based on SCr of 0.86 mg/dL). Liver Function Tests: Recent Labs  Lab  08/17/21 1811 08/18/21 0239 08/19/21 0026  AST '23 30 21  ' ALT '15 17 16  ' ALKPHOS 71 91 89  BILITOT 0.8 0.6 0.6  PROT 4.4* 5.0* 5.0*  ALBUMIN 2.3* 2.5* 2.5*   No results for input(s): "LIPASE", "AMYLASE" in the last 168 hours. No results for input(s): "AMMONIA" in the last 168 hours. Coagulation Profile: Recent Labs  Lab 08/18/21 1642  INR 1.4*   Cardiac Enzymes: No results for input(s): "CKTOTAL", "CKMB", "CKMBINDEX", "TROPONINI" in the last 168 hours. BNP (last 3 results) No results for input(s): "PROBNP" in the last 8760 hours. HbA1C: No results for input(s): "HGBA1C" in the last 72 hours. CBG: No results for input(s): "GLUCAP" in the last 168 hours. Lipid Profile: No results for input(s): "CHOL", "HDL", "LDLCALC", "TRIG", "CHOLHDL", "LDLDIRECT" in the last 72 hours. Thyroid Function Tests: No results for input(s): "TSH", "T4TOTAL", "FREET4", "T3FREE", "THYROIDAB" in the last 72 hours. Anemia Panel: Recent Labs    08/18/21 0518 08/18/21 1642  FERRITIN  --  120  TIBC  --  238*  IRON  --  10*  RETICCTPCT 2.5  --    Sepsis Labs: Recent Labs  Lab 08/17/21 1811 08/17/21 2214 08/18/21 0239 08/19/21 0941  PROCALCITON  --   --  15.99 5.87  LATICACIDVEN 1.7 0.8  --   --     Recent Results (from the past 240 hour(s))  Culture, blood (routine x 2)     Status: None (Preliminary result)   Collection Time: 08/17/21  7:41 PM   Specimen: BLOOD RIGHT FOREARM  Result Value Ref Range Status   Specimen Description BLOOD RIGHT FOREARM  Final   Special Requests   Final    BOTTLES DRAWN AEROBIC AND ANAEROBIC Blood Culture results may not be  optimal due to an excessive volume of blood received in culture bottles   Culture   Final    NO GROWTH 2 DAYS Performed at [facility], [place] [address] [place], [place] [address]    Report Status PENDING  Incomplete  Culture, blood (routine x 2)     Status: None (Preliminary result)   Collection Time: 08/17/21  7:52 PM    Specimen: BLOOD RIGHT HAND  Result Value Ref Range Status   Specimen Description BLOOD RIGHT HAND  Final   Special Requests   Final    BOTTLES DRAWN AEROBIC AND ANAEROBIC Blood Culture adequate volume   Culture   Final    NO GROWTH 2 DAYS Performed at [facility], [place] [address] [place], [place] [address]    Report Status PENDING  Incomplete  MRSA Next Gen by PCR, Nasal     Status: None   Collection Time: 08/18/21  3:18 PM   Specimen: Nasal Mucosa; Nasal Swab  Result Value Ref Range Status   MRSA by PCR Next Gen NOT DETECTED NOT DETECTED Final    Comment: (NOTE) The GeneXpert MRSA Assay (FDA approved for NASAL specimens only), is one component of a comprehensive MRSA colonization surveillance program. It is not intended to diagnose MRSA infection nor to guide or monitor treatment for MRSA infections. Test performance is not FDA approved in patients less than 35 years old. Performed at [facility], [place] [address] [place], [place] [address]      Radiology Studies: ECHOCARDIOGRAM LIMITED  Result Date: 08/19/2021    ECHOCARDIOGRAM LIMITED REPORT   Patient Name:   NOCHUM FENTER Date of Exam: 08/19/2021 Medical Rec #:  [id number]    Height:       66.5 in Accession #:    [id number]   Weight:       175.3 lb Date of Birth:  December 30, 1940    BSA:          1.901 m Patient Age:    71 years     BP:           136/68 mmHg Patient Gender: M            HR:           76 bpm. Exam Location:  Inpatient Procedure: Limited Echo and Intracardiac Opacification Agent Indications:    CHF  History:        Patient has prior history of Echocardiogram examinations. CHF,                 CAD, COPD and TIA, Aortic Valve Disease; Risk                 Factors:Hypertension and Dyslipidemia.  Sonographer:    Merrie Roof RDCS Referring Phys: [id number] [place]  1. Left ventricular ejection fraction, by estimation, is 65 to 70%. The left ventricle has normal function. Left ventricular endocardial  border not optimally defined to evaluate regional wall motion.  2. Limited echo to evaluate LVEF and wall motion  3. Limited visualization of wall motion even with contrast. LVEF is unchanged from prior study FINDINGS  Left Ventricle: Left ventricular ejection fraction, by estimation, is 65 to 70%. The left ventricle has normal function. Left ventricular endocardial border not optimally defined to evaluate regional wall motion. Definity contrast agent was given IV to delineate the left ventricular endocardial borders. Carlyle Dolly MD Electronically signed by Carlyle Dolly MD Signature Date/Time: 08/19/2021/6:09:00 PM  Final    DG CHEST PORT 1 VIEW  Result Date: 08/19/2021 CLINICAL DATA:  Shortness of breath EXAM: PORTABLE CHEST 1 VIEW COMPARISON:  08/19/2021 CT and prior studies FINDINGS: Cardiomediastinal silhouette is unchanged and evidence of the aortic valve replacement again noted. Airspace disease/consolidation within the mid-LOWER RIGHT lung again identified. The LEFT lung appears clear. No pneumothorax or pleural effusion identified. No acute bony abnormalities are noted. IMPRESSION: Little significant change with continued RIGHT lung airspace disease/consolidation. Electronically Signed   By: Margarette Canada M.D.   On: 08/19/2021 08:08   CT Angio Chest/Abd/Pel for Dissection W and/or Wo Contrast  Result Date: 08/17/2021 CLINICAL DATA:  Chest pain or back pain, aortic dissection suspected EXAM: CT ANGIOGRAPHY CHEST, ABDOMEN AND PELVIS TECHNIQUE: Non-contrast CT of the chest was initially obtained. Multidetector CT imaging through the chest, abdomen and pelvis was performed using the standard protocol during bolus administration of intravenous contrast. Multiplanar reconstructed images and MIPs were obtained and reviewed to evaluate the vascular anatomy. RADIATION DOSE REDUCTION: This exam was performed according to the departmental dose-optimization program which includes automated exposure  control, adjustment of the mA and/or kV according to patient size and/or use of iterative reconstruction technique. CONTRAST:  68m OMNIPAQUE IOHEXOL 350 MG/ML SOLN COMPARISON:  CTA abdomen pelvis 03/24/2021, CT chest abdomen pelvis 12/03/2020 FINDINGS: CTA CHEST FINDINGS Cardiovascular: Extensive coronary artery calcification predominantly within the left anterior descending coronary artery. Transcatheter aortic valve replacement has been performed. Global cardiac size within normal limits. No pericardial effusion. Central pulmonary arteries are enlarged in keeping with changes of pulmonary arterial hypertension, stable since prior examination. Moderate atherosclerotic calcification is seen within the thoracic aorta. No aortic aneurysm, intramural hematoma, or dissection. Mediastinum/Nodes: Visualized thyroid is unremarkable. No pathologic thoracic adenopathy. The esophagus is unremarkable. Lungs/Pleura: Airspace infiltrate is seen throughout the right lung with focal consolidation within the superior segment of the right lower lobe, in keeping with acute infection in the appropriate clinical setting. Associated bronchial wall thickening in keeping with airway inflammation. No central obstructing mass. Small amount of layering debris noted within the right mainstem bronchus and bronchus intermedius. Minimal patchy infiltrate also noted within the left upper lobe. No pneumothorax or pleural effusion. Musculoskeletal: No acute bone abnormality. No lytic or blastic bone lesion. Review of the MIP images confirms the above findings. CTA ABDOMEN AND PELVIS FINDINGS VASCULAR Aorta: Fusiform infrarenal abdominal aortic aneurysm has undergone endovascular repair utilizing a bifurcated stent graft with infrarenal proximal fixation and bilateral common iliac distal fixation. The aneurysm sac is unchanged in size when measured in identical fashion measuring 5.4 x 4.4 cm at axial image # 173/7 (measuring identically on image #  66/7 of previous examination). The repaired segment appears patent. Superimposed extensive atherosclerotic calcification. No periaortic inflammatory change or fluid collections are identified. Celiac: Less than 50% stenosis of the origin, unchanged. Distally widely patent. Variant anatomy with the celiac axis supplying only the left gastric artery and splenic artery. SMA: Replaced common hepatic artery. Less than 50% stenosis of the origin. Distally widely patent. No aneurysm or dissection. Renals: Dual renal arteries bilaterally with accessory lower pole renal arteries noted. 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 Normal vascular morphology. No aneurysm or dissection. 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 Distally widely patent. Inflow: No evidence of hemodynamically significant stenosis involving the lower extremity arterial  inflow bilaterally. Internal iliac arteries are stenotic at their origins, but are patent bilaterally. Veins: No obvious venous abnormality within the limitations of this arterial phase study. Review of the MIP images confirms the above findings. NON-VASCULAR Hepatobiliary: No focal liver abnormality is seen. Status post cholecystectomy. No biliary dilatation. Pancreas: Unremarkable Spleen: Unremarkable Adrenals/Urinary Tract: The adrenal glands are unremarkable. The kidneys are normal. Bladder is unremarkable. Stomach/Bowel: Moderate descending and sigmoid colonic diverticulosis without superimposed acute inflammatory change. The stomach, small bowel, and large bowel are otherwise unremarkable. Appendix normal. No free intraperitoneal gas or fluid. Lymphatic: No pathologic adenopathy within the abdomen and pelvis. Reproductive: Mild prostatic enlargement Other: No abdominal wall hernia  Musculoskeletal: Left total hip arthroplasty and L4-5 hemilaminectomies and lumbar discectomy with interbody bone cage placement has been performed. No definite bridging callus identified at L4-5. No acute bone abnormality. No lytic or blastic bone lesion. Review of the MIP images confirms the above findings. IMPRESSION: 1. No evidence of thoracoabdominal aortic dissection. 2. Extensive coronary artery calcification. Status post transcatheter aortic valve replacement. 3. Multifocal pulmonary infiltrate, most severe within the superior segment of the right lower lobe, in keeping with acute infection in the appropriate clinical setting. Associated bronchial wall thickening in keeping with airway inflammation. No central obstructing mass identified. 4. Stable changes of pulmonary arterial hypertension. 5. Stable infrarenal abdominal aortic aneurysm status post endovascular repair. Stable aneurysm sac size at 5.4 cm. 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 6. Bilateral renal artery hemodynamically significant stenoses, involving the main right renal artery and both the main as well as accessory lower polar left renal arteries. 7. Moderate distal colonic diverticulosis without superimposed acute inflammatory change. 8. Postsurgical changes of L4-5 hemilaminectomies and lumbar discectomy with interbody bone cage placement. No definite bridging callus identified at L4-5. Electronically Signed   By: Fidela Salisbury M.D.   On: 08/17/2021 23:13     Scheduled Meds:  aspirin EC  81 mg Oral Daily   atorvastatin  80 mg Oral Daily   carvedilol  12.5 mg Oral BID   citalopram  20 mg Oral Daily   ezetimibe  10 mg Oral Daily   guaiFENesin  1,200 mg Oral BID   heparin  5,000 Units Subcutaneous Q8H   ipratropium-albuterol  3 mL Nebulization Q6H   methylPREDNISolone (SOLU-MEDROL) injection  80 mg Intravenous BID   pantoprazole  40 mg Oral QHS   vitamin B-12  1,000 mcg Oral Daily   Continuous Infusions:  azithromycin 500 mg (08/18/21  2244)   cefTRIAXone (ROCEPHIN)  IV 1 g (08/18/21 2143)     LOS: 2 days   Raiford Noble, DO [facility] Available via Epic secure chat 7am-7pm After these hours, please refer to coverage provider listed on [email or web address] 08/19/2021, 7:14 PM

## 2021-08-19 NOTE — Evaluation (Signed)
Physical Therapy Evaluation Patient Details Name: Brandon [name] MRN: [id number] DOB: 1940-02-09 Today's Date: 08/19/2021  History of Present Illness  Pt is a 81 yo male presenting with worsening lower back pain. He is admitted with sepsis and acute respiratory failure from PNA. Pt reportedly choked on his pills on 7/15. Pt was previously followed in 2020 by OP SLP after surgery and XRT for lingual cancer (onset 2019). PMH includes: GERD, esophageal stretching, AAA s/p stent, HTN, HLD, aortic stenosis s/p TAVR, chronic low back pain s/p L4-L5 hemilaminectomy and discectomy, CHF, anemia, tonsillar/lingual ca (s/p transhyoid/transcervical resection of the tonsil and tongue base with left neck dissection on 12/02/17. 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, requiring two returns to the OR;  scar tissue release and tongue repair with [facility] esophageal dilation)  Clinical Impression  Pt admitted with above diagnosis. Pt was able to ambulate with RW with good safety awareness.  Pt has RW at home and states he will use it initially on return.  Should progress well.  Pt weaning off O2 today and will need to reassess O2 as pt may not need O2 when he goes home.  Pt currently with functional limitations due to the deficits listed below (see PT Problem List). Pt will benefit from skilled PT to increase their independence and safety with mobility to allow discharge to the venue listed below.       SATURATION QUALIFICATIONS: (This note is used to comply with regulatory documentation for home oxygen)  Patient Saturations on Room Air at Rest = 94%  Patient Saturations on Room Air while Ambulating = 89%  Patient Saturations on 2 Liters of oxygen while Ambulating = 93%  Please briefly explain why patient needs home oxygen:Pt requiring O2 to keep sats >90%.      Recommendations for follow up therapy are one component of a multi-disciplinary discharge planning process, led by the  attending physician.  Recommendations may be updated based on patient status, additional functional criteria and insurance authorization.  Follow Up Recommendations Home health PT      Assistance Recommended at Discharge Set up Supervision/Assistance  Patient can return home with the following  A little help with walking and/or transfers;A little help with bathing/dressing/bathroom;Assistance with cooking/housework;Assist for transportation;Help with stairs or ramp for entrance    Equipment Recommendations None recommended by PT, ?home O2  Recommendations for Other Services       Functional Status Assessment Patient has had a recent decline in their functional status and demonstrates the ability to make significant improvements in function in a reasonable and predictable amount of time.     Precautions / Restrictions Precautions Precautions: Fall Restrictions Weight Bearing Restrictions: No      Mobility  Bed Mobility Overal bed mobility: Independent                  Transfers Overall transfer level: Independent                      Ambulation/Gait Ambulation/Gait assistance: Min guard Gait Distance (Feet): 150 Feet Assistive device: Rolling walker (2 wheels) Gait Pattern/deviations: Step-through pattern, Decreased stride length   Gait velocity interpretation: 1.31 - 2.62 ft/sec, indicative of limited community ambulator   General Gait Details: Pt able to ambualte with rW wtih good stability. Pt uses quad cane at home normally but states that he may use RW until he can get back pain under control.  He has very good safety with RW.  Stairs            Wheelchair Mobility    Modified Rankin (Stroke Patients Only)       Balance                                             Pertinent Vitals/Pain Pain Assessment Pain Assessment: Faces Faces Pain Scale: Hurts little more Pain Location: back Pain Descriptors / Indicators:  Discomfort Pain Intervention(s): Limited activity within patient's tolerance, Monitored during session, Repositioned, Patient requesting pain meds-RN notified    Home Living Family/patient expects to be discharged to:: Private residence Living Arrangements: Spouse/significant other Available Help at Discharge: Family;Available 24 hours/day (wife is unable to assist physically though) Type of Home: House Home Access: Stairs to enter Entrance Stairs-Rails: Can reach both Entrance Stairs-Number of Steps: 4   Home Layout: Two level;Able to live on main level with bedroom/bathroom Home Equipment: [place] (2 wheels);Grab bars - tub/shower;Shower seat - built in;BSC/3in1      Prior Function Prior Level of Function : Independent/Modified Independent;Driving;History of Falls (last six months)             Mobility Comments: Uses quad-cane majority of time but otherwise holds onto furniture. Hx of several falls recently. ADLs Comments: Manages own meds, cleans, cooks, etc. [name] reports pt has had a progressive decline in cognition recently with memory deficits at baseline     Hand Dominance   Dominant Hand: Right    Extremity/Trunk Assessment   Upper Extremity Assessment Upper Extremity Assessment: Defer to OT evaluation    Lower Extremity Assessment Lower Extremity Assessment: Generalized weakness    Cervical / Trunk Assessment Cervical / Trunk Assessment: Normal  Communication   Communication: HOH  Cognition Arousal/Alertness: Awake/alert Behavior During Therapy: WFL for tasks assessed/performed Overall Cognitive Status: Within Functional Limits for tasks assessed                                          General Comments General comments (skin integrity, edema, etc.): 76 bpm. 95% 5L, 146/77.  Nurse asked PT to check O2 and try to wean.  Pt was able to ambulate on RA with sats 89%. 94% with 2L O2 with activity.   Upon return to room  left pt on 2LO2 with sats 93%.    Exercises General Exercises - Lower Extremity Ankle Circles/Pumps: AROM, Both, 10 reps, Seated Long Arc Quad: AROM, Both, 10 reps, Seated   Assessment/Plan    PT Assessment Patient needs continued PT services  PT Problem List Decreased activity tolerance;Decreased balance;Decreased mobility;Decreased knowledge of use of DME;Decreased safety awareness;Decreased knowledge of precautions;Cardiopulmonary status limiting activity;Pain       PT Treatment Interventions DME instruction;Gait training;Functional mobility training;Therapeutic activities;Stair training;Therapeutic exercise;Balance training;Patient/family education    PT Goals (Current goals can be found in the Care Plan section)  Acute Rehab PT Goals Patient Stated Goal: to go home PT Goal Formulation: With patient Time For Goal Achievement: 09/02/21 Potential to Achieve Goals: Good    Frequency Min 3X/week     Co-evaluation               AM-PAC PT "6 Clicks" Mobility  Outcome Measure Help needed turning from your back to your side while in a flat bed  without using bedrails?: None Help needed moving from lying on your back to sitting on the side of a flat bed without using bedrails?: None Help needed moving to and from a bed to a chair (including a wheelchair)?: A Little Help needed standing up from a chair using your arms (e.g., wheelchair or bedside chair)?: A Little Help needed to walk in hospital room?: A Little Help needed climbing 3-5 steps with a railing? : A Lot 6 Click Score: 19    End of Session Equipment Utilized During Treatment: Gait belt;Oxygen Activity Tolerance: Patient tolerated treatment well Patient left: in chair;with call bell/phone within reach;with chair alarm set Nurse Communication: Mobility status PT Visit Diagnosis: Muscle weakness (generalized) (M62.81)    Time: 0076-2263 PT Time Calculation (min) (ACUTE ONLY): 24 min   Charges:   PT  Evaluation $PT Eval Moderate Complexity: 1 Mod PT Treatments $Gait Training: 8-22 mins        [facility] M,PT Acute Rehab Services [place] 08/19/2021, 3:48 PM

## 2021-08-19 NOTE — Progress Notes (Signed)
SATURATION QUALIFICATIONS: (This note is used to comply with regulatory documentation for home oxygen)  Patient Saturations on Room Air at Rest = 94%  Patient Saturations on Room Air while Ambulating = 89%  Patient Saturations on 2 Liters of oxygen while Ambulating = 93%  Please briefly explain why patient needs home oxygen:Pt requiring O2 to keep sats >90%.    Milyn Stapleton M,PT Acute Rehab Services [phone]

## 2021-08-19 NOTE — Progress Notes (Signed)
[facility] for IV heparin Indication: chest pain/ACS  Allergies  Allergen Reactions   Celebrex [Celecoxib] Hives and Itching   Tape     Tears skin. Prefers paper tape Please    Patient Measurements: Height: 5' 6.5" (168.9 cm) Weight: 79.5 kg (175 lb 4.3 oz) IBW/kg (Calculated) : 64.95 Heparin Dosing Weight: 79.5 kg  Vital Signs: Temp: 97.8 F (36.6 C) (07/15 1100) Temp Source: Oral (07/15 1100) BP: 136/68 (07/15 1100) Pulse Rate: 91 (07/15 1100)  Labs: Recent Labs    08/17/21 1811 08/17/21 1820 08/18/21 0239 08/18/21 0518 08/18/21 0548 08/18/21 1642 08/19/21 0026 08/19/21 0941 08/19/21 1133  HGB 7.6* 7.5*  --  8.0* 8.2* 8.5* 8.1*  --   --   HCT 24.4* 22.0*  --  25.7* 24.0* 26.3* 25.2*  --   --   PLT 102*  --   --  83*  --   --  75*  --   --   LABPROT  --   --   --   --   --  17.1*  --   --   --   INR  --   --   --   --   --  1.4*  --   --   --   CREATININE 2.46* 2.60* 1.78*  --   --   --  0.86  --   --   TROPONINIHS  --   --   --   --   --   --   --  276* 245*    Estimated Creatinine Clearance: 70.9 mL/min (by C-G formula based on SCr of 0.86 mg/dL).   Medications:  Infusions:   azithromycin 500 mg (08/18/21 2244)   cefTRIAXone (ROCEPHIN)  IV 1 g (08/18/21 2143)   heparin      Assessment: 81 yo male with elevated troponins, chest pain.  Pharmacy asked to begin anticoagulation with IV heparin.  Hgb low at baseline and platelets have been trending down.  Goal of Therapy:  Heparin level 0.3-0.7 units/ml Monitor platelets by anticoagulation protocol: Yes   Plan:  Start IV heparin with bolus of 4000 x 1.  Then start heparin gtt at 1100 units/hr Check heparin level in 8 hrs. Daily heparin level and CBC.  [place] Crane, Roylene Reason, BCCP Clinical Pharmacist  08/19/2021 12:58 PM   [facility] pharmacy phone numbers are listed on [email or web address]

## 2021-08-20 ENCOUNTER — Inpatient Hospital Stay (HOSPITAL_COMMUNITY): Payer: Medicare Other

## 2021-08-20 DIAGNOSIS — K2100 Gastro-esophageal reflux disease with esophagitis, without bleeding: Secondary | ICD-10-CM

## 2021-08-20 DIAGNOSIS — J189 Pneumonia, unspecified organism: Secondary | ICD-10-CM | POA: Diagnosis not present

## 2021-08-20 DIAGNOSIS — D649 Anemia, unspecified: Secondary | ICD-10-CM | POA: Diagnosis not present

## 2021-08-20 DIAGNOSIS — J9601 Acute respiratory failure with hypoxia: Secondary | ICD-10-CM | POA: Diagnosis not present

## 2021-08-20 DIAGNOSIS — E785 Hyperlipidemia, unspecified: Secondary | ICD-10-CM

## 2021-08-20 DIAGNOSIS — N179 Acute kidney failure, unspecified: Secondary | ICD-10-CM | POA: Diagnosis not present

## 2021-08-20 LAB — COMPREHENSIVE METABOLIC PANEL
ALT: 16 U/L (ref 0–44)
AST: 14 U/L — ABNORMAL LOW (ref 15–41)
Albumin: 2.5 g/dL — ABNORMAL LOW (ref 3.5–5.0)
Alkaline Phosphatase: 82 U/L (ref 38–126)
Anion gap: 5 (ref 5–15)
BUN: 41 mg/dL — ABNORMAL HIGH (ref 8–23)
CO2: 22 mmol/L (ref 22–32)
Calcium: 8.7 mg/dL — ABNORMAL LOW (ref 8.9–10.3)
Chloride: 111 mmol/L (ref 98–111)
Creatinine, Ser: 0.92 mg/dL (ref 0.61–1.24)
GFR, Estimated: >60 mL/min (ref >60)
Glucose, Bld: 156 mg/dL — ABNORMAL HIGH (ref 70–99)
Potassium: 4.4 mmol/L (ref 3.5–5.1)
Sodium: 138 mmol/L (ref 135–145)
Total Bilirubin: 0.5 mg/dL (ref 0.3–1.2)
Total Protein: 5.2 g/dL — ABNORMAL LOW (ref 6.5–8.1)

## 2021-08-20 LAB — CBC WITH DIFFERENTIAL/PLATELET
Abs Immature Granulocytes: 0.06 10*3/uL (ref 0.00–0.07)
Basophils Absolute: 0 10*3/uL (ref 0.0–0.1)
Basophils Relative: 0 %
Eosinophils Absolute: 0 10*3/uL (ref 0.0–0.5)
Eosinophils Relative: 0 %
HCT: 25.4 % — ABNORMAL LOW (ref 39.0–52.0)
Hemoglobin: 8.1 g/dL — ABNORMAL LOW (ref 13.0–17.0)
Immature Granulocytes: 1 %
Lymphocytes Relative: 4 %
Lymphs Abs: 0.3 10*3/uL — ABNORMAL LOW (ref 0.7–4.0)
MCH: 28.1 pg (ref 26.0–34.0)
MCHC: 31.9 g/dL (ref 30.0–36.0)
MCV: 88.2 fL (ref 80.0–100.0)
Monocytes Absolute: 0.2 10*3/uL (ref 0.1–1.0)
Monocytes Relative: 3 %
Neutro Abs: 5.9 10*3/uL (ref 1.7–7.7)
Neutrophils Relative %: 92 %
Platelets: 94 10*3/uL — ABNORMAL LOW (ref 150–400)
RBC: 2.88 MIL/uL — ABNORMAL LOW (ref 4.22–5.81)
RDW: 17.5 % — ABNORMAL HIGH (ref 11.5–15.5)
WBC: 6.5 10*3/uL (ref 4.0–10.5)
nRBC: 0 % (ref 0.0–0.2)

## 2021-08-20 LAB — MAGNESIUM: Magnesium: 2.3 mg/dL (ref 1.7–2.4)

## 2021-08-20 LAB — PROCALCITONIN: Procalcitonin: 4.02 ng/mL

## 2021-08-20 LAB — PHOSPHORUS: Phosphorus: 4 mg/dL (ref 2.5–4.6)

## 2021-08-20 MED ORDER — LEVALBUTEROL HCL 0.63 MG/3ML IN NEBU
0.6300 mg | INHALATION_SOLUTION | Freq: Three times a day (TID) | RESPIRATORY_TRACT | Status: DC
  Administered 2021-08-21 (×2): 0.63 mg via RESPIRATORY_TRACT
  Filled 2021-08-20 (×2): qty 3

## 2021-08-20 MED ORDER — IPRATROPIUM BROMIDE 0.02 % IN SOLN
0.5000 mg | Freq: Four times a day (QID) | RESPIRATORY_TRACT | Status: DC
  Administered 2021-08-20 (×2): 0.5 mg via RESPIRATORY_TRACT
  Filled 2021-08-20 (×2): qty 2.5

## 2021-08-20 MED ORDER — LEVALBUTEROL HCL 0.63 MG/3ML IN NEBU
0.6300 mg | INHALATION_SOLUTION | Freq: Four times a day (QID) | RESPIRATORY_TRACT | Status: DC
  Administered 2021-08-20 (×2): 0.63 mg via RESPIRATORY_TRACT
  Filled 2021-08-20 (×2): qty 3

## 2021-08-20 MED ORDER — HYDROXYZINE HCL 25 MG PO TABS
25.0000 mg | ORAL_TABLET | Freq: Once | ORAL | Status: AC
Start: 2021-08-20 — End: 2021-08-20
  Administered 2021-08-20: 25 mg via ORAL
  Filled 2021-08-20: qty 1

## 2021-08-20 MED ORDER — AZITHROMYCIN 500 MG PO TABS
500.0000 mg | ORAL_TABLET | Freq: Every day | ORAL | Status: DC
  Administered 2021-08-20: 500 mg via ORAL
  Filled 2021-08-20: qty 1

## 2021-08-20 MED ORDER — IPRATROPIUM BROMIDE 0.02 % IN SOLN
0.5000 mg | Freq: Three times a day (TID) | RESPIRATORY_TRACT | Status: DC
  Administered 2021-08-21 (×2): 0.5 mg via RESPIRATORY_TRACT
  Filled 2021-08-20 (×2): qty 2.5

## 2021-08-20 MED ORDER — IPRATROPIUM-ALBUTEROL 0.5-2.5 (3) MG/3ML IN SOLN
3.0000 mL | Freq: Three times a day (TID) | RESPIRATORY_TRACT | Status: DC
  Administered 2021-08-20: 3 mL via RESPIRATORY_TRACT
  Filled 2021-08-20: qty 3

## 2021-08-20 NOTE — Progress Notes (Signed)
SATURATION QUALIFICATIONS: (This note is used to comply with regulatory documentation for home oxygen)  Patient Saturations on Room Air at Rest = 91%  Patient Saturations on Room Air while Ambulating = 81%  Patient Saturations on 6 Liters of oxygen while Ambulating = 94%  Please briefly explain why patient needs home oxygen: Patient's oxygen d- saturates while on room air.

## 2021-08-20 NOTE — Progress Notes (Signed)
Progress Note  Patient Name: Brandon [name] Date of Encounter: 08/20/2021  Primary Cardiologist: Loralie Champagne, MD   Subjective   C/o low back pain.  Inpatient Medications    Scheduled Meds:  aspirin EC  81 mg Oral Daily   atorvastatin  80 mg Oral Daily   carvedilol  12.5 mg Oral BID   citalopram  20 mg Oral Daily   ezetimibe  10 mg Oral Daily   guaiFENesin  1,200 mg Oral BID   heparin  5,000 Units Subcutaneous Q8H   ipratropium  0.5 mg Nebulization Q6H   levalbuterol  0.63 mg Nebulization Q6H   methylPREDNISolone (SOLU-MEDROL) injection  60 mg Intravenous BID   pantoprazole  40 mg Oral QHS   vitamin B-12  1,000 mcg Oral Daily   Continuous Infusions:  azithromycin 250 mL/hr at 08/20/21 0013   cefTRIAXone (ROCEPHIN)  IV 1 g (08/19/21 2156)   PRN Meds: acetaminophen **OR** acetaminophen, albuterol, fentaNYL (SUBLIMAZE) injection, melatonin, naLOXone (NARCAN)  injection, nitroGLYCERIN, oxyCODONE, traMADol   Vital Signs    Vitals:   08/20/21 0031 08/20/21 0455 08/20/21 0700 08/20/21 0904  BP: 119/67 139/67 (!) 144/60   Pulse: 68 68 (!) 59   Resp: 20 20 (!) 22   Temp: 98.1 F (36.7 C) 97.7 F (36.5 C) 97.8 F (36.6 C)   TempSrc: Oral Oral Oral   SpO2: 98% 93% 95% 92%  Weight:      Height:        Intake/Output Summary (Last 24 hours) at 08/20/2021 1025 Last data filed at 08/19/2021 1500 Gross per 24 hour  Intake 240 ml  Output 200 ml  Net 40 ml   Filed Weights   08/18/21 0835 08/18/21 1534 08/19/21 0552  Weight: 75.5 kg 82.2 kg 79.5 kg    Telemetry    nsr - Personally Reviewed  ECG    none - Personally Reviewed  Physical Exam   GEN: No acute distress.   Neck: No JVD Cardiac: RRR, no murmurs, rubs, or gallops.  Respiratory: Clear to auscultation bilaterally. GI: Soft, nontender, non-distended  MS: No edema; No deformity. Neuro:  Nonfocal  Psych: Normal affect   Labs    Chemistry Recent Labs  Lab 08/17/21 1811 08/17/21 1820 08/18/21 0239  08/18/21 0548 08/19/21 0026  NA 134* 133* 134* 135 137  K 5.3* 5.3* 5.0 5.0 4.6  CL 104 103 104  --  110  CO2 22  --  22  --  20*  GLUCOSE 94 82 88  --  192*  BUN 63* 69* 59*  --  36*  CREATININE 2.46* 2.60* 1.78*  --  0.86  CALCIUM 7.1*  --  7.5*  --  8.4*  PROT 4.4*  --  5.0*  --  5.0*  ALBUMIN 2.3*  --  2.5*  --  2.5*  AST 23  --  30  --  21  ALT 15  --  17  --  16  ALKPHOS 71  --  91  --  89  BILITOT 0.8  --  0.6  --  0.6  GFRNONAA 26*  --  39*  --  >60  ANIONGAP 8  --  8  --  7     Hematology Recent Labs  Lab 08/18/21 0518 08/18/21 0548 08/18/21 1642 08/19/21 0026 08/20/21 0931  WBC 7.1  --   --  5.6 6.5  RBC 2.81*  2.77*  --   --  2.87* 2.88*  HGB 8.0*   < >  8.5* 8.1* 8.1*  HCT 25.7*   < > 26.3* 25.2* 25.4*  MCV 91.5  --   --  87.8 88.2  MCH 28.5  --   --  28.2 28.1  MCHC 31.1  --   --  32.1 31.9  RDW 18.3*  --   --  17.5* 17.5*  PLT 83*  --   --  75* 94*   < > = values in this interval not displayed.    Cardiac EnzymesNo results for input(s): "TROPONINI" in the last 168 hours. No results for input(s): "TROPIPOC" in the last 168 hours.   BNP Recent Labs  Lab 08/18/21 0518  BNP 632.7*     DDimer No results for input(s): "DDIMER" in the last 168 hours.   Radiology    ECHOCARDIOGRAM LIMITED  Result Date: 08/19/2021    ECHOCARDIOGRAM LIMITED REPORT   Patient Name:   Brandon [name] Date of Exam: 08/19/2021 Medical Rec #:  [id number]    Height:       66.5 in Accession #:    [id number]   Weight:       175.3 lb Date of Birth:  [birth year]    BSA:          1.901 m Patient Age:    45 years     BP:           136/68 mmHg Patient Gender: M            HR:           76 bpm. Exam Location:  Inpatient Procedure: Limited Echo and Intracardiac Opacification Agent Indications:    CHF  History:        Patient has prior history of Echocardiogram examinations. CHF,                 CAD, COPD and TIA, Aortic Valve Disease; Risk                 Factors:Hypertension and Dyslipidemia.   Sonographer:    Merrie Roof RDCS Referring Phys: [id number] [place]  1. Left ventricular ejection fraction, by estimation, is 65 to 70%. The left ventricle has normal function. Left ventricular endocardial border not optimally defined to evaluate regional wall motion.  2. Limited echo to evaluate LVEF and wall motion  3. Limited visualization of wall motion even with contrast. LVEF is unchanged from prior study FINDINGS  Left Ventricle: Left ventricular ejection fraction, by estimation, is 65 to 70%. The left ventricle has normal function. Left ventricular endocardial border not optimally defined to evaluate regional wall motion. Definity contrast agent was given IV to delineate the left ventricular endocardial borders. Carlyle Dolly MD Electronically signed by Carlyle Dolly MD Signature Date/Time: 08/19/2021/6:09:00 PM    Final    DG CHEST PORT 1 VIEW  Result Date: 08/19/2021 CLINICAL DATA:  Shortness of breath EXAM: PORTABLE CHEST 1 VIEW COMPARISON:  08/19/2021 CT and prior studies FINDINGS: Cardiomediastinal silhouette is unchanged and evidence of the aortic valve replacement again noted. Airspace disease/consolidation within the mid-LOWER RIGHT lung again identified. The LEFT lung appears clear. No pneumothorax or pleural effusion identified. No acute bony abnormalities are noted. IMPRESSION: Little significant change with continued RIGHT lung airspace disease/consolidation. Electronically Signed   By: Margarette Canada M.D.   On: 08/19/2021 08:08    Cardiac Studies   none  Patient Profile     81 y.o. male admitted with pneumonia and atypical chest pain  Assessment & Plan  Atypical chest pain - no indication for additional cardiac work up. Echo EF is normal. Back pain - this is his biggest complaint and he will need followup with his pain specialist. CAD - stable. Continue current meds. HTN - his bp is reasonably well controlled. No additional rec's.  [facility] will  sign off.   Medication Recommendations:  none Other recommendations (labs, testing, etc):  none Follow up as an outpatient:  as scheduled previously.  For questions or updates, please contact [place] Please consult [email or web address] for contact info under Cardiology/STEMI.      Signed, Cristopher Peru, MD  08/20/2021, 10:25 AM

## 2021-08-20 NOTE — Progress Notes (Signed)
PROGRESS NOTE    Brandon [name]  XBJ:[id number] DOB: 05/09/1940 DOA: 08/17/2021 PCP: Haydee Salter, MD   Brief Narrative:  The patient is a 81 year old overweight Caucasian male with a past medical history significant for but not limited to abdominal aortic aneurysm status post endovascular stent graft in January 2023, GERD, hypertension, hyperlipidemia, history of severe aortic stenosis status post TAVR, history of chronic low back pain status post L4-L5 hemilaminectomy and discectomy with continued severe back pain, chronic diastolic CHF, anemia chronic disease with hemoglobin of 8-10 as well as other comorbidities who presented to the hospital with complaints of worsening low back pain.  Patient states that he was seen at [facility] physician and he was scheduled for an outpatient MRI but states that he had worsening back pain on the right side and became extremely weak.  He has been taking oxycodone 10 mg every 6 scheduled and noted that he continued to have significant pain.  He also states that he had 2 to 3-day period of nonproductive cough associated with some shortness of breath with no orthopnea, PND or worsening peripheral edema.  He ate new was noted to be hospitalized in May 2023 for AKI with mild hyperkalemia and at that time his serum creatinine was noted to be 7.43 and improved back to baseline of approximately 1.  Patient currently does not require any supplemental oxygen and only takes Breo and albuterol as needed given that he has a 75-year pack history of smoking.  Coming to the ED he was found to have some hypoxia and tachypnea and had to be placed on 4 L of supplemental oxygen via nasal cannula.  Labs showed an elevated BUN/creatinine with an AKI again.  CT of the chest abdomen pelvis dissection protocol was done to evaluate the patient's low blood pressure in the setting of his abdominal aortic aneurysm and showed a stable infrarenal abdominal aortic aneurysm status post endovascular  repair.  Also did show evidence of multifocal pulmonary infiltrates consistent with pneumonia and associated bronchial wall thickening suggestive of airway inflammation with no evidence of edema, pleural effusion or pneumothorax.  A CT of the chest abdomen pelvis also showed evidence of bilateral renal artery stenosis and moderate colonic diverticulosis without evidence of diverticulitis.  He was given IV fluid boluses and admitted for further management of his AKI with hyperkalemia as well as acute respiratory failure with hypoxia and hypotension in the setting of sepsis secondary to community-acquired pneumonia.  His O2 has been weaned to 2 L and he is improving and chest x-ray shows right lung airspace disease.  Today started having some chest pain after he swallowed some pills but his troponin was elevated so cardiology was consulted and initially recommended heparin drip but this is now stopped.  Troponin is trending down and cardiology recommending a limited echo.  Patient is a poor cath candidate given his anemia and heme positive stools along with side thrombocytopenia.   Assessment and Plan:  Acute respiratory failure with hypoxia in the setting of community-acquired pneumonia Severe Sepsis 2/2 to CAP with associated Hypotension -Patient has had new onse cough with associated shortness of breath and CTA of the chest showed radiographic evidence of multilobular infiltrates consistent with pneumonia -Met severe Sepsis Criteria given that he had a significant AKI, New O2 Requirement and was Tachycardic and Tachypenic as a source of infection with the multifocal pneumonia -Patient has not been febrile or had any leukocytosis -Blood cultures x2 collected in the ED -Patient was noted  to be saturating in the mid to high 80s on room air and had to be placed on supplemental oxygen with improvement of his saturations to the mid 90s -He was initiated on antibiotics with IV ceftriaxone and  azithromycin -SpO2: 92 % O2 Flow Rate (L/min): 4 L/min FiO2 (%): 50 % -ABG    Component Value Date/Time   PHART 7.336 (L) 12/04/2020 1038   PCO2ART 52.1 (H) 12/04/2020 1038   PO2ART 55 (L) 12/04/2020 1038   HCO3 23.1 08/18/2021 0548   TCO2 24 08/18/2021 0548   ACIDBASEDEF 1.0 08/18/2021 0548   O2SAT 99 08/18/2021 0548    -Significant amount of oxygen but this is being weaned and he was on 2 L ambulatory -Continuous pulse oximetry maintain O2 saturation greater 90% -Continue supplemental oxygen via nasal cannula and wean O2 as tolerated -Patient will need an ambulatory home O2 screen as well as repeat chest x-ray prior to discharge -Given his long-term smoking he could have a component of COPD -Patient has been initiated on scheduled nebulizer treatments (changed to Xopenex/Atrovent as opposed to DuoNeb) and as needed albuterol nebulizer and was also given Solu-Medrol -Given IV fluid hydration and fluid boluses but now IVF Hydration stopped  -Check Streph Pnuemoniae Ur Ag and this was negative -Blood cultures x2 pending and showing no growth to date at 3 Days currently -MRSA PCR via NexGen is not detected -Procalcitonin level was elevated at 15.99 and now trended down to 5.87 -> 4.02 -We will need incentive spirometry, guaifenesin 1200 mg p.o. twice daily as well as a flutter valve -Was initiated on IV Solumedrol 80 mg BID and started weaning and has now gone to 60 twice daily yesterday and will go to 40 mg BID this evening in anticipation for transitioning to Sterapred Taper  -Repeat Ambulatory Home O2 Screen today and Repeat CXR pending this AM    AKI Metabolic Acidosis -In the setting of Infection  -Patient's BUN/Cr went from 69/2.60 -> 59/1.78 and is now improved to 36/0.86 yesterday with repeat pending  -IV fluid hydration stop -Holding nephrotoxic medications including lisinopril Lasix -Patient had a slight metabolic acidosis with a CO2 of 20, anion gap of 7, chloride level  110 on yesterday's labs with repeat pending this AM  -Avoid nephrotoxic medications, contrast dyes, hypotension and dehydration and will need to renally adjust medications -Continue To monitor and trend and repeat CMP in the AM   Hyperkalemia -Patient's K+ was 5.3 and is now improved to 4.6 on last check with repeat pending this AM  -In the setting of AKI -Improving  -Continue to monitor and trend on repeat CMP in the the a.m.   Thrombocytopenia -Patient's platelet count went from 102 and is now 83 -Continue to monitor and trend and repeat CBC in the a.m.   Acute on chronic normocytic anemia -Patient's hemoglobin/hematocrit went from 7.5/22.0 -> 8.2/24.0 -> 8.5/26.3 and is now slightly lower at 8.1/25.2 on last check with repeat pending this AM  -Anemia panel was checked and showed an iron level of 10, U IBC 228, TIBC of 238, saturation ratios of 4%, ferritin level 120 -FOBT is positive but he is not really having signs and symptoms of bleeding -Continue monitor signs and symptoms of bleeding; no overt bleeding noted -Need to discuss with gastroenterology -Repeat CBC in a.m.   Chronic diastolic CHF -Continue monitor strict I's and O's and daily weight -BNP was 632.7 -Patient is +1.785 Liters  -IV fluid hydration is now stopped -Repeat chest x-ray this  AM pending    Hyperlipidemia -Continue with Ezetimibe 10 mg p.o. daily and atorvastatin 80 mg p.o. daily   Depression and Anxiety -Continue with Citalopram 20 mg p.o. daily   Hypomagnesemia -Patient's mag level is now 1.6 on last check with repeat pending this AM -Replete with IV mag sulfate 2 g yesterday  -Continue to monitor and replete as necessary -Repeat mag level in a.m.   Chest pain, improved  -Describes typical symptoms but had after he swallowed pills -EKG done and troponin x2 was ordered and was slightly elevated to 76 but trended down to 245 -Chest pain resolved spontaneously and cardiology was consulted and  initially recommended heparin drip but this was discontinued -Cardiology following and recommending a limited echo and showed Normal EF -Further care per Cards    Chronic low back pain -Resumed his home oxycodone and tramadol -Also has IV fentanyl 50 mcg every 2 as needed for severe pain as well as Narcan if needed -Was post to be scheduled for outpatient MRI but may be able to be done in house if he continues to have significant back pain  but this is stable -Pending PT/OT recommending Home Health    GERD and GI prophylaxis -Continue with Pantoprazole 40 mg p.o. nightly   Aortic Aneurysm -CTA done and showed "Stable infrarenal abdominal aortic aneurysm status post endovascular repair. Stable aneurysm sac size at 5.4 cm. 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  CHF,                  CAD, COPD and TIA, Aortic Valve Disease; Risk                  Factors:Hypertension and Dyslipidemia.     Sonographer:    Merrie Roof RDCS  Referring Phys: [id number]     1. Left ventricular ejection fraction, by estimation, is 65 to 70%. The  left ventricle has normal function. Left ventricular endocardial border  not optimally defined to evaluate regional wall motion.   2. Limited echo to evaluate LVEF and wall motion   3. Limited visualization of wall motion even with contrast. LVEF is  unchanged from prior study   FINDINGS   Left  Ventricle: Left ventricular ejection fraction, by estimation, is 65  to 70%. The left ventricle has normal function. Left ventricular  endocardial border not optimally defined to evaluate regional wall motion.  Definity contrast agent was given IV to  delineate the left ventricular endocardial borders.   Antimicrobials:  Anti-infectives (From admission, onward)    Start     Dose/Rate Route Frequency Ordered Stop   08/18/21 0230  azithromycin (ZITHROMAX) 500 mg in sodium chloride 0.9 % 250 mL IVPB        500 mg 250 mL/hr over 60 Minutes Intravenous Daily at bedtime 08/18/21 0214     08/18/21 0230  cefTRIAXone (ROCEPHIN) 1 g in sodium chloride 0.9 %  100 mL IVPB        1 g 200 mL/hr over 30 Minutes Intravenous Daily at bedtime 08/18/21 0214          Subjective: Seen and examined at bedside and he is laying in the bed and thinks his breathing is doing much better today but was complaining of some back pain.  States that he ambulated well yesterday.  No nausea or vomiting.  States that his chest pain is resolved.  No other concerns or complaints at this time.  Objective: Vitals:   08/20/21 0031 08/20/21 0455 08/20/21 0700 08/20/21 0904  BP: 119/67 139/67 (!) 144/60   Pulse: 68 68 (!) 59   Resp: 20 20 (!) 22   Temp: 98.1 F (36.7 C) 97.7 F (36.5 C) 97.8 F (36.6 C)   TempSrc: Oral Oral Oral   SpO2: 98% 93% 95% 92%  Weight:      Height:        Intake/Output Summary (Last 24 hours) at 08/20/2021 0936 Last data filed at 08/19/2021 1500 Gross per 24 hour  Intake 240 ml  Output 200 ml  Net 40 ml   Filed Weights   08/18/21 0835 08/18/21 1534 08/19/21 0552  Weight: 75.5 kg 82.2 kg 79.5 kg   Examination: Physical Exam:  Constitutional: WN/WD overweight Caucasian male currently no acute distress appears calm Respiratory: Diminished to auscultation bilaterally with coarse breath sounds with some mild rhonchi, no wheezing, rales, or crackles. Normal respiratory effort and patient  is not tachypenic. No accessory muscle use.  Unlabored breathing and is wearing at least 3 L of supplemental oxygen nasal cannula Cardiovascular: RRR, no murmurs / rubs / gallops. S1 and S2 auscultated.  Mild lower extremity edema Abdomen: Soft, non-tender, distended secondary body habitus.  Bowel sounds positive and a little hyperactive.  GU: Deferred. Musculoskeletal: No clubbing / cyanosis of digits/nails. No joint deformity upper and lower extremities.  Skin: No rashes, lesions, ulcers on limited skin evaluation. No induration; Warm and dry.  Neurologic: CN 2-12 grossly intact with no focal deficits. Romberg sign and cerebellar reflexes not assessed.  Psychiatric: Normal judgment and insight. Alert and oriented x 3. Normal mood and appropriate affect.   Data Reviewed: I have personally reviewed following labs and imaging studies  CBC: Recent Labs  Lab 08/17/21 1811 08/17/21 1820 08/18/21 0518 08/18/21 0548 08/18/21 1642 08/19/21 0026  WBC 8.5  --  7.1  --   --  5.6  NEUTROABS 7.6  --  6.3  --   --  5.1  HGB 7.6* 7.5* 8.0* 8.2* 8.5* 8.1*  HCT 24.4* 22.0* 25.7* 24.0* 26.3* 25.2*  MCV 91.4  --  91.5  --   --  87.8  PLT 102*  --  83*  --   --  75*   Basic Metabolic Panel: Recent Labs  Lab 08/17/21 1811 08/17/21 1820 08/18/21 0239 08/18/21 0518 08/18/21 0548 08/19/21 0026  NA 134* 133* 134*  --  135 137  K 5.3* 5.3* 5.0  --  5.0 4.6  CL 104 103 104  --   --  110  CO2 22  --  22  --   --  20*  GLUCOSE 94 82 88  --   --  192*  BUN 63* 69* 59*  --   --  36*  CREATININE 2.46* 2.60* 1.78*  --   --  0.86  CALCIUM 7.1*  --  7.5*  --   --  8.4*  MG  --   --  1.3* 1.4*  --  1.6*  PHOS  --   --   --   --   --  2.6   GFR: Estimated Creatinine Clearance: 70.9 mL/min (by C-G formula based on SCr of 0.86 mg/dL). Liver Function Tests: Recent Labs  Lab 08/17/21 1811 08/18/21 0239 08/19/21 0026  AST _0 ALT _1 ALKPHOS 71 91 89  BILITOT 0.8 0.6 0.6  PROT 4.4* 5.0*  5.0*  ALBUMIN 2.3* 2.5* 2.5*   No results for input(s): "LIPASE", "AMYLASE" in the last 168 hours. No results for input(s): "AMMONIA" in the last 168 hours. Coagulation Profile: Recent Labs  Lab 08/18/21 1642  INR 1.4*   Cardiac Enzymes: No results for input(s): "CKTOTAL", "CKMB", "CKMBINDEX", "TROPONINI" in the last 168 hours. BNP (last 3 results) No results for input(s): "PROBNP" in the last 8760 hours. HbA1C: No results for input(s): "HGBA1C" in the last 72 hours. CBG: No results for input(s): "GLUCAP" in the last 168 hours. Lipid Profile: No results for input(s): "CHOL", "HDL", "LDLCALC", "TRIG", "CHOLHDL", "LDLDIRECT" in the last 72 hours. Thyroid Function Tests: No results for input(s): "TSH", "T4TOTAL", "FREET4", "T3FREE", "THYROIDAB" in the last 72 hours. Anemia Panel: Recent Labs    08/18/21 0518 08/18/21 1642  FERRITIN  --  120  TIBC  --  238*  IRON  --  10*  RETICCTPCT 2.5  --    Sepsis Labs: Recent Labs  Lab 08/17/21 1811 08/17/21 2214 08/18/21 0239 08/19/21 0941 08/20/21 0435  PROCALCITON  --   --  15.99 5.87 4.02  LATICACIDVEN 1.7 0.8  --   --   --     Recent Results (from the past 240 hour(s))  Culture, blood (routine x 2)     Status: None (Preliminary result)   Collection Time: 08/17/21  7:41 PM   Specimen: BLOOD RIGHT FOREARM  Result Value Ref Range Status   Specimen Description BLOOD RIGHT FOREARM  Final   Special Requests   Final    BOTTLES DRAWN AEROBIC AND ANAEROBIC Blood Culture results may not be optimal due to an excessive volume of blood received in culture bottles   Culture   Final    NO GROWTH 3 DAYS Performed at [facility], [place] [address] [place], [place] [address]    Report Status PENDING  Incomplete  Culture, blood (routine x 2)     Status: None (Preliminary result)   Collection Time: 08/17/21  7:52 PM   Specimen: BLOOD RIGHT HAND  Result Value Ref Range Status   Specimen Description BLOOD RIGHT HAND  Final    Special Requests   Final    BOTTLES DRAWN AEROBIC AND ANAEROBIC Blood Culture adequate volume   Culture   Final    NO GROWTH 3 DAYS Performed at [facility], [place] [address] [place], [place] [address]    Report Status PENDING  Incomplete  MRSA Next Gen by PCR, Nasal     Status: None   Collection Time: 08/18/21  3:18 PM   Specimen: Nasal Mucosa; Nasal Swab  Result Value Ref Range Status   MRSA by PCR Next Gen NOT DETECTED NOT DETECTED Final    Comment: (NOTE) The GeneXpert MRSA Assay (FDA approved for NASAL specimens only), is one component of a comprehensive MRSA colonization surveillance program. It is not intended to diagnose MRSA infection nor to guide or monitor treatment for MRSA infections. Test performance is not FDA approved in patients less than 2 years  old. Performed at [facility], [place] [address], [place], [place] [address]      Radiology Studies: ECHOCARDIOGRAM LIMITED  Result Date: 08/19/2021    ECHOCARDIOGRAM LIMITED REPORT   Patient Name:   Brandon [name] Date of Exam: 08/19/2021 Medical Rec #:  [id number]    Height:       66.5 in Accession #:    [id number]   Weight:       175.3 lb Date of Birth:  1940/08/14    BSA:          1.901 m Patient Age:    43 years     BP:           136/68 mmHg Patient Gender: M            HR:           76 bpm. Exam Location:  Inpatient Procedure: Limited Echo and Intracardiac Opacification Agent Indications:    CHF  History:        Patient has prior history of Echocardiogram examinations. CHF,                 CAD, COPD and TIA, Aortic Valve Disease; Risk                 Factors:Hypertension and Dyslipidemia.  Sonographer:    Merrie Roof RDCS Referring Phys: [id number] [place]  1. Left ventricular ejection fraction, by estimation, is 65 to 70%. The left ventricle has normal function. Left ventricular endocardial border not optimally defined to evaluate regional wall motion.  2. Limited echo to evaluate LVEF and wall  motion  3. Limited visualization of wall motion even with contrast. LVEF is unchanged from prior study FINDINGS  Left Ventricle: Left ventricular ejection fraction, by estimation, is 65 to 70%. The left ventricle has normal function. Left ventricular endocardial border not optimally defined to evaluate regional wall motion. Definity contrast agent was given IV to delineate the left ventricular endocardial borders. Carlyle Dolly MD Electronically signed by Carlyle Dolly MD Signature Date/Time: 08/19/2021/6:09:00 PM    Final    DG CHEST PORT 1 VIEW  Result Date: 08/19/2021 CLINICAL DATA:  Shortness of breath EXAM: PORTABLE CHEST 1 VIEW COMPARISON:  08/19/2021 CT and prior studies FINDINGS: Cardiomediastinal silhouette is unchanged and evidence of the aortic valve replacement again noted. Airspace disease/consolidation within the mid-LOWER RIGHT lung again identified. The LEFT lung appears clear. No pneumothorax or pleural effusion identified. No acute bony abnormalities are noted. IMPRESSION: Little significant change with continued RIGHT lung airspace disease/consolidation. Electronically Signed   By: Margarette Canada M.D.   On: 08/19/2021 08:08     Scheduled Meds:  aspirin EC  81 mg Oral Daily   atorvastatin  80 mg Oral Daily   carvedilol  12.5 mg Oral BID   citalopram  20 mg Oral Daily   ezetimibe  10 mg Oral Daily   guaiFENesin  1,200 mg Oral BID   heparin  5,000 Units Subcutaneous Q8H   ipratropium  0.5 mg Nebulization Q6H   levalbuterol  0.63 mg Nebulization Q6H   methylPREDNISolone (SOLU-MEDROL) injection  60 mg Intravenous BID   pantoprazole  40 mg Oral QHS   vitamin B-12  1,000 mcg Oral Daily   Continuous Infusions:  azithromycin 250 mL/hr at 08/20/21 0013   cefTRIAXone (ROCEPHIN)  IV 1 g (08/19/21 2156)     LOS: 3 days   Raiford Noble, DO [facility] Available via Epic secure chat  7am-7pm After these hours, please refer to coverage provider listed on [email or web address] 08/20/2021,  9:36 AM

## 2021-08-21 ENCOUNTER — Inpatient Hospital Stay (HOSPITAL_COMMUNITY): Payer: Medicare Other

## 2021-08-21 ENCOUNTER — Other Ambulatory Visit (HOSPITAL_COMMUNITY): Payer: Self-pay

## 2021-08-21 DIAGNOSIS — J9601 Acute respiratory failure with hypoxia: Secondary | ICD-10-CM | POA: Diagnosis not present

## 2021-08-21 DIAGNOSIS — N179 Acute kidney failure, unspecified: Secondary | ICD-10-CM | POA: Diagnosis not present

## 2021-08-21 DIAGNOSIS — D649 Anemia, unspecified: Secondary | ICD-10-CM | POA: Diagnosis not present

## 2021-08-21 DIAGNOSIS — J189 Pneumonia, unspecified organism: Secondary | ICD-10-CM | POA: Diagnosis not present

## 2021-08-21 LAB — CBC WITH DIFFERENTIAL/PLATELET
Abs Immature Granulocytes: 0.06 10*3/uL (ref 0.00–0.07)
Basophils Absolute: 0 10*3/uL (ref 0.0–0.1)
Basophils Relative: 0 %
Eosinophils Absolute: 0 10*3/uL (ref 0.0–0.5)
Eosinophils Relative: 0 %
HCT: 25.2 % — ABNORMAL LOW (ref 39.0–52.0)
Hemoglobin: 8 g/dL — ABNORMAL LOW (ref 13.0–17.0)
Immature Granulocytes: 1 %
Lymphocytes Relative: 5 %
Lymphs Abs: 0.3 10*3/uL — ABNORMAL LOW (ref 0.7–4.0)
MCH: 28.7 pg (ref 26.0–34.0)
MCHC: 31.7 g/dL (ref 30.0–36.0)
MCV: 90.3 fL (ref 80.0–100.0)
Monocytes Absolute: 0.2 10*3/uL (ref 0.1–1.0)
Monocytes Relative: 3 %
Neutro Abs: 5.5 10*3/uL (ref 1.7–7.7)
Neutrophils Relative %: 91 %
Platelets: 99 10*3/uL — ABNORMAL LOW (ref 150–400)
RBC: 2.79 MIL/uL — ABNORMAL LOW (ref 4.22–5.81)
RDW: 17.4 % — ABNORMAL HIGH (ref 11.5–15.5)
WBC: 6.1 10*3/uL (ref 4.0–10.5)
nRBC: 0 % (ref 0.0–0.2)

## 2021-08-21 LAB — COMPREHENSIVE METABOLIC PANEL
ALT: 17 U/L (ref 0–44)
AST: 15 U/L (ref 15–41)
Albumin: 2.5 g/dL — ABNORMAL LOW (ref 3.5–5.0)
Alkaline Phosphatase: 79 U/L (ref 38–126)
Anion gap: 9 (ref 5–15)
BUN: 44 mg/dL — ABNORMAL HIGH (ref 8–23)
CO2: 25 mmol/L (ref 22–32)
Calcium: 8.8 mg/dL — ABNORMAL LOW (ref 8.9–10.3)
Chloride: 103 mmol/L (ref 98–111)
Creatinine, Ser: 1.05 mg/dL (ref 0.61–1.24)
GFR, Estimated: >60 mL/min (ref >60)
Glucose, Bld: 147 mg/dL — ABNORMAL HIGH (ref 70–99)
Potassium: 5.3 mmol/L — ABNORMAL HIGH (ref 3.5–5.1)
Sodium: 137 mmol/L (ref 135–145)
Total Bilirubin: 0.6 mg/dL (ref 0.3–1.2)
Total Protein: 5.1 g/dL — ABNORMAL LOW (ref 6.5–8.1)

## 2021-08-21 LAB — PHOSPHORUS: Phosphorus: 4.6 mg/dL (ref 2.5–4.6)

## 2021-08-21 LAB — PROCALCITONIN: Procalcitonin: 1.85 ng/mL

## 2021-08-21 LAB — MAGNESIUM: Magnesium: 2.2 mg/dL (ref 1.7–2.4)

## 2021-08-21 MED ORDER — CEFDINIR 300 MG PO CAPS
300.0000 mg | ORAL_CAPSULE | Freq: Two times a day (BID) | ORAL | 0 refills | Status: AC
  Filled 2021-08-21: qty 6, 3d supply, fill #0

## 2021-08-21 MED ORDER — PREDNISONE 10 MG (21) PO TBPK
ORAL_TABLET | ORAL | 0 refills | Status: DC
  Filled 2021-08-21: qty 21, 6d supply, fill #0

## 2021-08-21 MED ORDER — PREDNISONE 5 MG PO TABS: 5.0000 mg | ORAL_TABLET | ORAL | Status: DC

## 2021-08-21 MED ORDER — IPRATROPIUM BROMIDE 0.02 % IN SOLN
0.5000 mg | Freq: Four times a day (QID) | RESPIRATORY_TRACT | 12 refills | Status: AC | PRN
Start: 2021-08-21 — End: ?
  Filled 2021-08-21: qty 75, 8d supply, fill #0

## 2021-08-21 MED ORDER — AZITHROMYCIN 500 MG PO TABS
500.0000 mg | ORAL_TABLET | Freq: Every day | ORAL | 0 refills | Status: AC
  Filled 2021-08-21: qty 3, 3d supply, fill #0

## 2021-08-21 MED ORDER — LEVALBUTEROL HCL 0.63 MG/3ML IN NEBU
0.6300 mg | INHALATION_SOLUTION | Freq: Four times a day (QID) | RESPIRATORY_TRACT | 12 refills | Status: DC | PRN
  Filled 2021-08-21: qty 75, 7d supply, fill #0

## 2021-08-21 MED ORDER — GUAIFENESIN ER 600 MG PO TB12
600.0000 mg | ORAL_TABLET | Freq: Two times a day (BID) | ORAL | 0 refills | Status: AC
  Filled 2021-08-21: qty 10, 5d supply, fill #0

## 2021-08-21 MED ORDER — SODIUM ZIRCONIUM CYCLOSILICATE 10 G PO PACK
10.0000 g | PACK | Freq: Once | ORAL | Status: AC
Start: 2021-08-21 — End: 2021-08-21
  Administered 2021-08-21: 10 g via ORAL
  Filled 2021-08-21: qty 1

## 2021-08-21 MED ORDER — PREDNISONE 1 MG PO TABS
1.0000 mg | ORAL_TABLET | ORAL | Status: DC
Start: 2021-08-28 — End: 2021-09-08

## 2021-08-21 NOTE — Plan of Care (Signed)

## 2021-08-21 NOTE — Discharge Summary (Signed)
Physician Discharge Summary   Patient: BLESSED ANDERER MRN: [id number] DOB: February 12, 1940  Admit date:     08/17/2021  Discharge date: 08/21/21  Discharge Physician: Marguerita Merles, DO   PCP: Loyola Mast, MD   Recommendations at discharge:    Follow-up with PCP within 1 to 2 weeks and repeat CBC, CMP, mag, Phos within 1 week Repeat chest x-ray in 3 to 6 weeks Follow-up with cardiology and vascular surgery in outpatient setting Follow-up with orthopedic surgery in the outpatient setting for back pain and have outpatient MRI done  Discharge Diagnoses: Principal Problem:   AKI (acute kidney injury) (HCC) Active Problems:   Acute on chronic anemia   Acute respiratory failure with hypoxia (HCC)   Hyperkalemia   Hyperlipidemia   Gastroesophageal reflux disease   Depression   Chronic diastolic heart failure (HCC)   CAP (community acquired pneumonia)   Hypotension  Resolved Problems:   * No resolved hospital problems. [employer] Course: The patient is a 81 year old overweight Caucasian male with a past medical history significant for but not limited to abdominal aortic aneurysm status post endovascular stent graft in January 2023, GERD, hypertension, hyperlipidemia, history of severe aortic stenosis status post TAVR, history of chronic low back pain status post L4-L5 hemilaminectomy and discectomy with continued severe back pain, chronic diastolic CHF, anemia chronic disease with hemoglobin of 8-10 as well as other comorbidities who presented to the hospital with complaints of worsening low back pain.  Patient states that he was seen at [facility] physician and he was scheduled for an outpatient MRI but states that he had worsening back pain on the right side and became extremely weak.  He has been taking oxycodone 10 mg every 6 scheduled and noted that he continued to have significant pain.  He also states that he had 2 to 3-day period of nonproductive cough associated with some shortness of  breath with no orthopnea, PND or worsening peripheral edema.  He ate new was noted to be hospitalized in May 2023 for AKI with mild hyperkalemia and at that time his serum creatinine was noted to be 7.43 and improved back to baseline of approximately 1.  Patient currently does not require any supplemental oxygen and only takes Breo and albuterol as needed given that he has a 75-year pack history of smoking.  Coming to the ED he was found to have some hypoxia and tachypnea and had to be placed on 4 L of supplemental oxygen via nasal cannula.  Labs showed an elevated BUN/creatinine with an AKI again.  CT of the chest abdomen pelvis dissection protocol was done to evaluate the patient's low blood pressure in the setting of his abdominal aortic aneurysm and showed a stable infrarenal abdominal aortic aneurysm status post endovascular repair.  Also did show evidence of multifocal pulmonary infiltrates consistent with pneumonia and associated bronchial wall thickening suggestive of airway inflammation with no evidence of edema, pleural effusion or pneumothorax.  A CT of the chest abdomen pelvis also showed evidence of bilateral renal artery stenosis and moderate colonic diverticulosis without evidence of diverticulitis.  He was given IV fluid boluses and admitted for further management of his AKI with hyperkalemia as well as acute respiratory failure with hypoxia and hypotension in the setting of sepsis secondary to community-acquired pneumonia.  His O2 has been weaned to 2 L and he is improving and chest x-ray shows right lung airspace disease.  The day before yesterday started having some chest pain after he swallowed  some pills but his troponin was elevated so cardiology was consulted and initially recommended heparin drip but this is now stopped.  Troponin is trending down and cardiology recommending a limited echo.  Patient is a poor cath candidate given his anemia and heme positive stools along with side  thrombocytopenia.  Echo was reviewed and his EF was normal.  Cardiology recommended no additional cardiac work-up inpatient.  Subsequently patient improved and is respiratory status was stable.  He was able to be weaned off of supplemental oxygen and he ambulated and did not desaturate.  He is medically stable for discharge and will be discharged home on a prednisone taper and then will be transition back to his home prednisone dose of 6 mg daily.  Will need to follow-up with cardiology in outpatient setting and follow-up with pulmonary and establish with them as well as his PCP.  Given his improvement he is the medically stable for discharge at this time  Assessment and Plan:  Acute respiratory failure with hypoxia in the setting of community-acquired pneumonia, improved significantly Severe Sepsis 2/2 to CAP with associated Hypotension -Patient has had new onse cough with associated shortness of breath and CTA of the chest showed radiographic evidence of multilobular infiltrates consistent with pneumonia -Met severe Sepsis Criteria given that he had a significant AKI, New O2 Requirement and was Tachycardic and Tachypenic as a source of infection with the multifocal pneumonia -Patient has not been febrile or had any leukocytosis -Blood cultures x2 collected in the ED -Patient was noted to be saturating in the mid to high 80s on room air and had to be placed on supplemental oxygen with improvement of his saturations to the mid 90s -He was initiated on antibiotics with IV ceftriaxone and azithromycin -SpO2: 95 % O2 Flow Rate (L/min): 2 L/min FiO2 (%): 50 %; patient was able to be weaned off of supplemental oxygen and did not desaturate on home O2 screen -ABG    Component Value Date/Time   PHART 7.336 (L) 12/04/2020 1038   PCO2ART 52.1 (H) 12/04/2020 1038   PO2ART 55 (L) 12/04/2020 1038   HCO3 23.1 08/18/2021 0548   TCO2 24 08/18/2021 0548   ACIDBASEDEF 1.0 08/18/2021 0548   O2SAT 99  08/18/2021 0548    -Significant amount of oxygen but this is being weaned and he was on 2 L ambulatory -Continuous pulse oximetry maintain O2 saturation greater 90% -Continue supplemental oxygen via nasal cannula and wean O2 as tolerated -Patient will need an ambulatory home O2 screen as well as repeat chest x-ray prior to discharge -Given his long-term smoking he could have a component of COPD -Patient has been initiated on scheduled nebulizer treatments (changed to Xopenex/Atrovent as opposed to DuoNeb) and as needed albuterol nebulizer and was also given Solu-Medrol -Given IV fluid hydration and fluid boluses but now IVF Hydration stopped  -Check Streph Pnuemoniae Ur Ag and this was negative -Blood cultures x2 pending and showing no growth to date at 3 Days currently -MRSA PCR via NexGen is not detected -Procalcitonin level was elevated at 15.99 and now trended down to 5.87 -> 4.02 and further trended down to 1.5 at the time of discharge -We will need incentive spirometry, guaifenesin 1200 mg p.o. twice daily as well as a flutter valve -Was initiated on IV Solumedrol 80 mg BID and started weaning and has now gone to 60 twice daily yesterday and will go to 40 mg BID this evening in anticipation for transitioning to Sterapred Taper as it was  written for the Sterapred taper and will transition back to his home dose of 6 mg of prednisone after it is completed this -Repeat Ambulatory Home O2 Screen done and he did not desaturate -Repeat chest x-ray today showed ". Stable right perihilar and infrahilar airspace disease with scattered opacities of the mid to lateral right base. Left lung remains clear.  No new abnormality.  The sulci are sharp. Mild cardiomegaly with aortic atherosclerosis. Stable mediastinum."   AKI Metabolic Acidosis -In the setting of Infection  -Patient's BUN/Cr went from 69/2.60 -> 59/1.78 and is now improved to 36/0.86 -> 41/0.92 -> 44/1.05 -IV fluid hydration stop -Holding  nephrotoxic medications including lisinopril Lasix -Patient had a slight metabolic acidosis with a CO2 of 20, anion gap of 7, chloride level 110 but now has improved and is CO2 is 25, anion gap is 9, chloride level is 130 -Avoid nephrotoxic medications, contrast dyes, hypotension and dehydration and will need to renally adjust medications -Continue To monitor and trend and repeat CMP in the AM   Hyperkalemia -Patient's K+ was 5.3 and is now improved to 4.6 on last check but then worsened again to 5.3 and was given Lokelma at the time of discharge -In the setting of AKI -Continue to monitor and trend on repeat CMP within 1 week   Thrombocytopenia -Patient's platelet count went from 102 and is now 83 the day before yesterday and yesterday is 94 and today is 99 -Continue to monitor and trend and repeat CBC within 1 week   Acute on chronic normocytic anemia -Patient's hemoglobin/hematocrit went from 7.5/22.0 -> 8.2/24.0 -> 8.5/26.3 and is now slightly lower at 8.1/25.2 with repeat being 8.1/25.4 yesterday and today being stable at 8.0/25.2-Anemia panel was checked and showed an iron level of 10, U IBC 228, TIBC of 238, saturation ratios of 4%, ferritin level 120 -FOBT is positive but he is not really having signs and symptoms of bleeding -Continue monitor signs and symptoms of bleeding; no overt bleeding noted -Follow-up with gastroenterology in outpatient setting -Repeat CBC within 1 week   Chronic diastolic CHF -Continue monitor strict I's and O's and daily weight -BNP was 632.7 -Patient is +1.168 Liters  -IV fluid hydration is now stopped -Repeat chest x-ray this AM as above   Hyperlipidemia -Continue with Ezetimibe 10 mg p.o. daily and atorvastatin 80 mg p.o. daily   Depression and Anxiety -Continue with Citalopram 20 mg p.o. daily   Hypomagnesemia -Patient's mag level is now 2.2  -Continue to monitor and replete as necessary -Repeat mag level in a.m.   Chest pain, improved   -Describes typical symptoms but had after he swallowed pills -EKG done and troponin x2 was ordered and was slightly elevated to 76 but trended down to 245 -Chest pain resolved spontaneously and cardiology was consulted and initially recommended heparin drip but this was discontinued -Cardiology following and recommending a limited echo and showed Normal EF -Further care per Cards recommending outpatient follow-up and no inpatient further cardiac work-up   Chronic low back pain -Resumed his home oxycodone and tramadol -Also has IV fentanyl 50 mcg every 2 as needed for severe pain as well as Narcan if needed -Was post to be scheduled for outpatient MRI and this can be done when he rates her -Pending PT/OT recommending Home Health is stable for discharge   GERD and GI prophylaxis -Continue with Pantoprazole 40 mg p.o. nightly   Aortic Aneurysm -CTA done and showed "Stable infrarenal abdominal aortic aneurysm status post endovascular repair.  Stable aneurysm sac size at 5.4 cm. 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 Prefers paper tape Please        Medication List     STOP taking these medications    alendronate 70 MG tablet Commonly known as: Fosamax   doxycycline 100 MG tablet Commonly known as: VIBRA-TABS       TAKE these medications    acetaminophen 500 MG tablet Commonly known as: TYLENOL Take 1,000 mg by mouth every 8 (eight) hours  as needed for mild pain.   albuterol 108 (90 Base) MCG/ACT inhaler Commonly known as: VENTOLIN HFA Inhale 2 puffs into the lungs every 6 (six) hours as needed for wheezing or shortness of breath.   allopurinol 100 MG tablet Commonly known as: ZYLOPRIM TAKE 2 TABLETS(200 MG) BY MOUTH DAILY What changed: See the new instructions.   amLODipine 10 MG tablet Commonly known as: NORVASC TAKE 1 TABLET(10 MG) BY MOUTH DAILY What changed: See the new instructions.   aspirin EC 81 MG tablet Take 81 mg by mouth daily. Swallow whole.   atorvastatin 80 MG tablet Commonly known as: LIPITOR Take 1 tablet (80 mg total) by mouth daily.   azithromycin 500 MG tablet Commonly known as: ZITHROMAX Take 1 tablet (500 mg total) by mouth at bedtime for 3 days.   carvedilol 12.5 MG tablet Commonly known as: Coreg Take 1 tablet (12.5 mg total) by mouth 2 (two) times daily.   cefdinir 300 MG capsule Commonly known as: OMNICEF Take 1 capsule (300 mg total) by mouth 2 (two) times daily for 3 days.   Cholecalciferol 25 MCG (1000 UT) tablet Take 1,000 Units by mouth daily.   citalopram 20 MG tablet Commonly known as: CELEXA TAKE 1 TABLET(20 MG) BY MOUTH DAILY What changed: See the new instructions.   ezetimibe 10 MG tablet Commonly known as: Zetia Take 1 tablet (10 mg total) by mouth daily.   fluticasone 50 MCG/ACT nasal spray Commonly known as: FLONASE SHAKE LIQUID AND USE 2 SPRAYS IN EACH NOSTRIL DAILY AS NEEDED FOR RHINITIS OR ALLERGIES What changed: See the new instructions.   furosemide 20 MG tablet Commonly known as: LASIX Take 20 mg by mouth daily.   gabapentin 300 MG capsule Commonly known as: NEURONTIN Take 300 mg by mouth 2 (two) times daily.   hydrOXYzine 25 MG capsule Commonly known as: VISTARIL Take 1 capsule (25 mg total) by mouth every 8 (eight) hours as needed. What changed: reasons to take this   ipratropium 0.02 % nebulizer solution Commonly known as: ATROVENT Use  1 vial (0.5 mg total) by nebulization every 6 (six) hours as needed for wheezing or shortness of breath.   ipratropium 0.03 % nasal spray Commonly known as: ATROVENT Place 2 sprays into both nostrils every 12 (twelve) hours. What changed:  when to take this reasons to take this   levalbuterol 0.63 MG/3ML nebulizer solution Commonly known as: XOPENEX Use 1 vial (0.63 mg total) by nebulization every 6 (six) hours as needed for wheezing or shortness of breath.   lisinopril 20 MG tablet Commonly known as: ZESTRIL Take 20 mg by mouth daily.   ondansetron 4 MG disintegrating tablet Commonly known as: ZOFRAN-ODT Take 1 tablet (4 mg total) by mouth every 8 (eight) hours as needed for nausea or vomiting.   Oxycodone HCl 10 MG Tabs Take 1 tablet (10 mg total) by mouth every 6 (six) hours as needed. What changed:  when to take this additional instructions   pantoprazole 40 MG tablet Commonly known as: PROTONIX Take 1 tablet (40 mg total) by mouth daily.   potassium chloride 10 MEQ tablet Commonly known as: KLOR-CON Take 2 tablets (20 mEq total) by mouth 2 (two) times daily.   predniSONE 10 MG (21) Tbpk tablet Commonly known as: STERAPRED UNI-PAK 21 TAB Take 6 tablets on day 1, 5 tabs on day 2, 4 tabs on day 3, 3 tabs on day 4, 2 tabs on day 5, 1 tab on day 6 and then transition back to all home dose of prednisone 6 mg daily What changed: You were already taking a medication with the same name, and this prescription was added. Make sure you understand how and when to take each.   predniSONE 5 MG tablet Commonly known as: DELTASONE Take 1 tablet (5 mg total) by mouth See admin instructions. Order date 08/02/21: take one tablet (5 mg) by mouth every morning with two 1 mg tablets for a total dose of 7 mg daily for 2 months, then reduce to 6 mg (5 mg + 1 mg) daily What changed:  when to take this additional instructions   predniSONE 1 MG tablet Commonly known as: DELTASONE Take 1-2  tablets (1-2 mg total) by mouth See admin instructions. Order date 08/02/21: Take 2 tablets (2 mg) by mouth every morning along with 5 mg tablet for a total of 7 mg daily for 2 months, then reduce to 6 mg (1 mg +5 mg) daily Start taking on: August 28, 2021 What changed:  how much to take how to take this when to take this additional instructions These instructions start on August 28, 2021. If you are unsure what to do until then, ask your doctor or other care provider.   SM Mucus Relief 600 MG 12 hr tablet Generic drug: guaiFENesin Take 1 tablet (600 mg total) by mouth 2 (two) times daily for 5 days.   Spiriva Respimat 2.5 MCG/ACT Aers Generic drug: Tiotropium Bromide Monohydrate Inhale 2 puffs into the lungs daily. What changed:  when to take this reasons to take this   tamsulosin 0.4 MG Caps capsule Commonly known as: FLOMAX Take 0.4 mg by mouth in the morning and at bedtime.   thiamine 100 MG tablet Take 1 tablet (100 mg total) by mouth daily.   traMADol 50 MG tablet Commonly known as: Ultram Take 2 tablets (100 mg total) by mouth 2 (two) times daily.   trolamine salicylate 10 % cream Commonly known as: ASPERCREME Apply 1 Application topically 2 (two) times daily as needed (arthritis pain).   vitamin B-12 1000 MCG tablet Commonly known as: CYANOCOBALAMIN Take 1 tablet (1,000 mcg total) by mouth daily.               Durable Medical Equipment  (From admission, onward)  Start     Ordered   08/21/21 0000  For home use only DME Nebulizer machine       Question Answer Comment  Patient needs a nebulizer to treat with the following condition SOB (shortness of breath)   Length of Need 6 Months      08/21/21 1440            Follow-up Information     [facility] Follow up.   Specialty: Home Health Services Why: Your home health has been set up with [facility]. The office will call you with start of service information. If you have any  questions or concerns please call the number listed above. Contact information: [address] [place] [address] [phone]                Discharge Exam: Filed Weights   08/18/21 1534 08/19/21 0552 08/21/21 0448  Weight: 82.2 kg 79.5 kg 79.5 kg   Vitals:   08/21/21 1318 08/21/21 1548  BP:  (!) 155/72  Pulse:  68  Resp:  12  Temp:  98.1 F (36.7 C)  SpO2: 95% 95%   Examination: Physical Exam:  Constitutional: WN/WD overweight Caucasian male currently no acute distress appears calm sitting in the chair bedside Respiratory: Diminished to auscultation bilaterally with coarse breath sounds worse on the right compared to left and some slight rhonchi and minimal wheezing, no rales, or crackles. Normal respiratory effort and patient is not tachypenic. No accessory muscle use.  Wearing 2 L supplemental oxygen via nasal cannula Cardiovascular: RRR, no murmurs / rubs / gallops. S1 and S2 auscultated.  Mild 1+ extremity edema Abdomen: Soft, non-tender, distended second by habitus. Bowel sounds positive.  GU: Deferred. Musculoskeletal: No clubbing / cyanosis of digits/nails. No joint deformity upper and lower extremities.  Skin: No rashes, lesions, ulcers limited skin evaluation. No induration; Warm and dry.  Neurologic: CN 2-12 grossly intact with no focal deficits. Romberg sign and cerebellar reflexes not assessed.  Psychiatric: Normal judgment and insight. Alert and oriented x 3. Normal mood and appropriate affect.   Condition at discharge: stable  The results of significant diagnostics from this hospitalization (including imaging, microbiology, ancillary and laboratory) are listed below for reference.   Imaging Studies: DG Swallowing Func-Speech Pathology  Result Date: 08/21/2021 Table formatting from the original result was not included. Objective Swallowing Evaluation: Type of Study: MBS-Modified Barium Swallow Study  Patient Details Name: ZADIAN PERKEY MRN:  [id number] Date of Birth: 15-Apr-1940 Today's Date: 08/21/2021 Time: SLP Start Time (ACUTE ONLY): 1237 -SLP Stop Time (ACUTE ONLY): 1255 SLP Time Calculation (min) (ACUTE ONLY): 18 min Past Medical History: Past Medical History: Diagnosis Date  Abdominal aneurysm (HCC)   Arthritis   "all over" (07/19/2016)  BPH (benign prostatic hypertrophy)   CAD (coronary artery disease)   Chicken pox   Chronic lower back pain   s/p surgical fusion  Depression   Diverticulosis   Esophageal stricture   GERD (gastroesophageal reflux disease)   Hepatitis B 1984  Hiatal hernia   History of radiation therapy 01/16/18- 03/05/18  Left Tonsil, 66 Gy in 33 fractions to high risk nodal echelons.   HLD (hyperlipidemia)   HTN (hypertension)   Liver abscess 07/10/2016  Osteoarthritis   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  s/p TAVR with a 29 mm Edwards Sapien 3 via the subclavian approach by Dr. Excell Seltzer and Dr Laneta Simmers   Severe aortic stenosis   TIA (transient ischemic attack) 1990s  hx  tonsillar ca dx'd 11/2017  Tubular adenoma of colon 2009 Past Surgical History: Past Surgical History: Procedure Laterality Date  ABDOMINAL AORTIC ENDOVASCULAR STENT GRAFT N/A 03/03/2021  Procedure: ABDOMINAL AORTIC ENDOVASCULAR STENT GRAFT;  Surgeon: Maeola Harman, MD;  Location: [facility] OR;  Service: Vascular;  Laterality: N/A;  ABDOMINAL AORTOGRAM W/LOWER EXTREMITY N/A 04/10/2021  Procedure: ABDOMINAL AORTOGRAM W/LOWER EXTREMITY;  Surgeon: Maeola Harman, MD;  Location: [facility] INVASIVE CV LAB;  Service: Cardiovascular;  Laterality: N/A;  BACK SURGERY    CARDIAC CATHETERIZATION    CATARACT EXTRACTION W/ INTRAOCULAR LENS  IMPLANT, BILATERAL Bilateral 01/2012 - 02/2012  CORONARY ATHERECTOMY N/A 04/27/2020  Procedure: CORONARY ATHERECTOMY;  Surgeon: Tonny Bollman, MD;  Location: [facility] INVASIVE CV LAB;  Service: Cardiovascular;  Laterality: N/A;  CORONARY STENT INTERVENTION N/A 04/27/2020  Procedure: CORONARY STENT INTERVENTION;  Surgeon:  Tonny Bollman, MD;  Location: [facility] INVASIVE CV LAB;  Service: Cardiovascular;  Laterality: N/A;  DIRECT LARYNGOSCOPY Left 10/09/2017  Procedure: DIRECT LARYNGOSCOPY WITH BOPSY;  Surgeon: Flo Shanks, MD;  Location: [facility];  Service: ENT;  Laterality: Left;  ESOPHAGOGASTRODUODENOSCOPY (EGD) WITH ESOPHAGEAL DILATION    "couple times" (07/19/2016)  ESOPHAGOSCOPY Left 10/09/2017  Procedure: ESOPHAGOSCOPY;  Surgeon: Flo Shanks, MD;  Location: [facility];  Service: ENT;  Laterality: Left;  INTRAVASCULAR IMAGING/OCT N/A 04/27/2020  Procedure: INTRAVASCULAR IMAGING/OCT;  Surgeon: Tonny Bollman, MD;  Location: [facility] INVASIVE CV LAB;  Service: Cardiovascular;  Laterality: N/A;  INTRAVASCULAR PRESSURE WIRE/FFR STUDY N/A 04/27/2020  Procedure: INTRAVASCULAR PRESSURE WIRE/FFR STUDY;  Surgeon: Tonny Bollman, MD;  Location: [facility] INVASIVE CV LAB;  Service: Cardiovascular;  Laterality: N/A;  IR GASTROSTOMY TUBE MOD SED  01/08/2018  IR THORACENTESIS ASP PLEURAL SPACE W/IMG GUIDE  07/19/2016  LAPAROSCOPIC CHOLECYSTECTOMY  1984  LUMBAR DISC SURGERY  05/1996  L4-5; Dr. Rosanne Sack LAMINECTOMY/DECOMPRESSION MICRODISCECTOMY  10/2002  L3-4. Dr. Newell Coral  MULTIPLE TOOTH EXTRACTIONS  1980s  PARTIAL GLOSSECTOMY  12/02/2017  Dr. Hezzie Bump- [facility]  pharyngoplasty for closure of tingue base defect  12/02/2017  Dr. Hezzie Bump- [facility]  PICC LINE INSERTION  07/15/2016  POSTERIOR LUMBAR FUSION  09/1996  Ray cage, L4-5 Dr. Jule Ser  PROSTATE BIOPSY  ~ 2017  radical tonsillectomy Left 12/02/2017  Dr. Hezzie Bump at [facility]  RIGHT HEART CATH AND CORONARY ANGIOGRAPHY N/A 03/31/2020  Procedure: RIGHT HEART CATH AND CORONARY ANGIOGRAPHY;  Surgeon: Laurey Morale, MD;  Location: [facility] INVASIVE CV LAB;  Service: Cardiovascular;  Laterality: N/A;  RIGID BRONCHOSCOPY Left 10/09/2017  Procedure: RIGID BRONCHOSCOPY;  Surgeon: Flo Shanks, MD;  Location: [facility];  Service: ENT;  Laterality: Left;  TEE WITHOUT  CARDIOVERSION N/A 06/07/2020  Procedure: TRANSESOPHAGEAL ECHOCARDIOGRAM (TEE);  Surgeon: Tonny Bollman, MD;  Location: [name] OR;  Service: Open Heart Surgery;  Laterality: N/A;  TONSILLECTOMY    TOTAL HIP ARTHROPLASTY Left 12/04/2020  Procedure: TOTAL HIP ARTHROPLASTY ANTERIOR APPROACH;  Surgeon: Samson Frederic, MD;  Location: [facility] ORS;  Service: Orthopedics;  Laterality: Left;  TOTAL HIP ARTHROPLASTY Left 11/2020  TRACHEOSTOMY  12/02/2017  Dr. Hezzie Bump- [facility]  ULTRASOUND GUIDANCE FOR VASCULAR ACCESS Right 06/07/2020  Procedure: ULTRASOUND GUIDANCE FOR VASCULAR ACCESS;  Surgeon: Tonny Bollman, MD;  Location: [facility] OR;  Service: Open Heart Surgery;  Laterality: Right;  ULTRASOUND GUIDANCE FOR VASCULAR ACCESS Bilateral 03/03/2021  Procedure: ULTRASOUND GUIDANCE FOR VASCULAR ACCESS, BILATERAL FEMORAL ARTERIES;  Surgeon: Maeola Harman, MD;  Location: [facility] OR;  Service: Vascular;  Laterality: Bilateral;  VASCULAR SURGERY   HPI: Pt is a 81  yo male presenting with worsening lower back pain. He is admitted with sepsis and acute respiratory failure from PNA. Pt reportedly choked on his pills on 7/15. Pt was previously followed in 2020 by OP SLP after surgery and XRT for lingual cancer (onset 2019). FEES at [facility], most recent 01-15-18 noted with reduced tongue base retraction, reduced UES opening, reduced hyoid excursion. Pt was consuming purees and thin liquids at that time, only taking liquids after XRT began, but working back to Dys 2. PMH includes: GERD, esophageal stretching, AAA s/p stent, HTN, HLD, aortic stenosis s/p TAVR, chronic low back pain s/p L4-L5 hemilaminectomy and discectomy, CHF, anemia, tonsillar/lingual ca (s/p transhyoid/transcervical resection of the tonsil and tongue base with left neck dissection on 12/02/17. 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  Recommendations may be updated based on patient status, additional functional criteria and insurance authorization. 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 He has impaired mastication with some lingual residue of solids that is mild. Lingual pumping is noted at times. He does not achieve consistent velopharyngeal closure, with nasal reflux observed only with thin liquids. Penetration also occurred with thin liquids, and across all consistencies he has significant vallecular residue. This is a result of limited hyolaryngeal movement. base of tongue retraction, and laryngeal vestibule closure. Dry swallows help to reduce pharyngeal residue, although pt prefers to use liqui washes. Penetration and nasal regurgitation seem to occur the most when he is using liquids to clear solids that already fill his valleculae; however, during this study, the majority of nasal reflux spills back down into the pharynx and trace penetration can be cleared with a cued throat clear. Note that barium tablet also appeared to get slowed in the distal esophagus, not fully clearing. Education was provided to pt with recommendations to remain on Dys 3 diet and thin liquids, but would use aspiration and esophageal precautions. In addition to basic precautions, would also implement a throat clear post-swallow and crush larger pills. SLP Visit Diagnosis Dysphagia, oropharyngeal phase (R13.12) Impact on safety and function Mild aspiration risk;Moderate aspiration risk     08/21/2021   3:00 PM Treatment  Recommendations Treatment Recommendations Therapy as outlined in treatment plan below     08/21/2021   3:00 PM Prognosis Prognosis for Safe Diet Advancement Good Barriers to Reach Goals Time post onset   08/21/2021   3:00 PM Diet Recommendations SLP Diet Recommendations Dysphagia 3 (Mech soft) solids;Thin liquid Liquid Administration via Cup;Straw Medication Administration Whole meds with liquid Compensations Slow rate;Small sips/bites;Follow solids with liquid;Clear throat after each swallow Postural Changes Seated upright at 90 degrees;Remain semi-upright after after feeds/meals (Comment)     08/21/2021   3:00 PM Other Recommendations Oral Care Recommendations Oral care BID Follow Up Recommendations Home health SLP Assistance recommended at discharge PRN Functional Status Assessment Patient has had a recent decline in their functional status and demonstrates the ability to make significant improvements in function in a reasonable and predictable amount of time.  08/21/2021   3:00 PM Frequency and Duration  Speech Therapy Frequency (ACUTE ONLY) min 2x/week Treatment Duration 2 weeks     08/21/2021   3:00 PM Oral Phase Oral Phase Impaired Oral - Thin Cup Decreased velopharyngeal closure;Nasal reflux Oral - Thin Straw Decreased velopharyngeal closure;Nasal reflux Oral - Regular Decreased bolus cohesion;Impaired mastication;Lingual/palatal residue    08/21/2021   3:00 PM Pharyngeal Phase Pharyngeal Phase Impaired Pharyngeal- Thin Cup Reduced tongue base retraction;Pharyngeal residue - valleculae;Reduced airway/laryngeal closure;Penetration/Aspiration during swallow;Reduced anterior laryngeal mobility;Reduced laryngeal elevation Pharyngeal Material enters airway, remains ABOVE vocal cords and not ejected out Pharyngeal- Thin Straw Reduced tongue base retraction;Pharyngeal residue - valleculae;Reduced airway/laryngeal closure;Penetration/Aspiration during swallow;Reduced anterior laryngeal mobility;Reduced laryngeal  elevation Pharyngeal Material enters airway, remains ABOVE vocal cords and not ejected out Pharyngeal- Puree Reduced tongue base retraction;Pharyngeal residue - valleculae;Pharyngeal residue - posterior pharnyx;Reduced laryngeal elevation;Reduced anterior laryngeal mobility Pharyngeal- Regular Reduced laryngeal elevation;Reduced anterior laryngeal mobility;Reduced tongue base retraction;Pharyngeal residue - valleculae Pharyngeal- Pill Reduced anterior laryngeal mobility;Reduced laryngeal elevation;Reduced tongue base retraction    08/21/2021   3:00 PM Cervical Esophageal Phase  Cervical Esophageal Phase [facility] Mahala Menghini., M.A. CCC-SLP Acute Rehabilitation Services Office [phone] Secure chat preferred 08/21/2021, 3:42 PM                     DG CHEST PORT 1 VIEW  Result Date: 08/21/2021 CLINICAL DATA:  Shortness of breath.  Pneumonia follow-up. EXAM: PORTABLE CHEST 1 VIEW COMPARISON:  Portable chest yesterday at 11:02 a.m. FINDINGS: 5:34 a.m. Stable right perihilar and infrahilar airspace disease with scattered opacities of the mid to lateral right base. Left lung remains clear.  No new abnormality.  The sulci are sharp. Mild cardiomegaly with aortic atherosclerosis. Stable mediastinum. Prior TEVAR. IMPRESSION: Overall aeration is unchanged.  No new infiltrate. Electronically Signed   By: Almira Bar M.D.   On: 08/21/2021 07:51   DG CHEST PORT 1 VIEW  Result Date: 08/20/2021 CLINICAL DATA:  Shortness of breath EXAM: PORTABLE CHEST 1 VIEW COMPARISON:  08/19/2021, 08/17/2021 FINDINGS: Stable heart size. Aortic atherosclerosis. Patchy airspace opacity within the right mid to lower lung, slightly improved compared to the previous radiograph. Left lung remains clear. No pleural effusion or pneumothorax. IMPRESSION: Slight improvement in right lung airspace disease. Electronically Signed   By: Duanne Guess D.O.   On: 08/20/2021 12:08   ECHOCARDIOGRAM LIMITED  Result Date: 08/19/2021    ECHOCARDIOGRAM  LIMITED REPORT   Patient Name:   NIVEN CAPPUCCIO Date of Exam: 08/19/2021 Medical Rec #:  [id number]    Height:       66.5 in Accession #:    [id number]   Weight:       175.3 lb Date of Birth:  10-Aug-1940    BSA:          1.901 m Patient Age:    80 years     BP:           136/68 mmHg Patient Gender: M            HR:           76 bpm. Exam Location:  Inpatient Procedure: Limited Echo and Intracardiac Opacification Agent Indications:    CHF  History:        Patient has prior history of Echocardiogram examinations. CHF,                 CAD, COPD and TIA, Aortic Valve Disease; Risk  Factors:Hypertension and Dyslipidemia.  Sonographer:    Roosvelt Maser RDCS Referring Phys: [id number] Dorothe Pea BRANCH IMPRESSIONS  1. Left ventricular ejection fraction, by estimation, is 65 to 70%. The left ventricle has normal function. Left ventricular endocardial border not optimally defined to evaluate regional wall motion.  2. Limited echo to evaluate LVEF and wall motion  3. Limited visualization of wall motion even with contrast. LVEF is unchanged from prior study FINDINGS  Left Ventricle: Left ventricular ejection fraction, by estimation, is 65 to 70%. The left ventricle has normal function. Left ventricular endocardial border not optimally defined to evaluate regional wall motion. Definity contrast agent was given IV to delineate the left ventricular endocardial borders. Dina Rich MD Electronically signed by Dina Rich MD Signature Date/Time: 08/19/2021/6:09:00 PM    Final    DG CHEST PORT 1 VIEW  Result Date: 08/19/2021 CLINICAL DATA:  Shortness of breath EXAM: PORTABLE CHEST 1 VIEW COMPARISON:  08/19/2021 CT and prior studies FINDINGS: Cardiomediastinal silhouette is unchanged and evidence of the aortic valve replacement again noted. Airspace disease/consolidation within the mid-LOWER RIGHT lung again identified. The LEFT lung appears clear. No pneumothorax or pleural effusion identified. No acute bony  abnormalities are noted. IMPRESSION: Little significant change with continued RIGHT lung airspace disease/consolidation. Electronically Signed   By: Harmon Pier M.D.   On: 08/19/2021 08:08   CT Angio Chest/Abd/Pel for Dissection W and/or Wo Contrast  Result Date: 08/17/2021 CLINICAL DATA:  Chest pain or back pain, aortic dissection suspected EXAM: CT ANGIOGRAPHY CHEST, ABDOMEN AND PELVIS TECHNIQUE: Non-contrast CT of the chest was initially obtained. Multidetector CT imaging through the chest, abdomen and pelvis was performed using the standard protocol during bolus administration of intravenous contrast. Multiplanar reconstructed images and MIPs were obtained and reviewed to evaluate the vascular anatomy. RADIATION DOSE REDUCTION: This exam was performed according to the departmental dose-optimization program which includes automated exposure control, adjustment of the mA and/or kV according to patient size and/or use of iterative reconstruction technique. CONTRAST:  80mL OMNIPAQUE IOHEXOL 350 MG/ML SOLN COMPARISON:  CTA abdomen pelvis 03/24/2021, CT chest abdomen pelvis 12/03/2020 FINDINGS: CTA CHEST FINDINGS Cardiovascular: Extensive coronary artery calcification predominantly within the left anterior descending coronary artery. Transcatheter aortic valve replacement has been performed. Global cardiac size within normal limits. No pericardial effusion. Central pulmonary arteries are enlarged in keeping with changes of pulmonary arterial hypertension, stable since prior examination. Moderate atherosclerotic calcification is seen within the thoracic aorta. No aortic aneurysm, intramural hematoma, or dissection. Mediastinum/Nodes: Visualized thyroid is unremarkable. No pathologic thoracic adenopathy. The esophagus is unremarkable. Lungs/Pleura: Airspace infiltrate is seen throughout the right lung with focal consolidation within the superior segment of the right lower lobe, in keeping with acute infection in the  appropriate clinical setting. Associated bronchial wall thickening in keeping with airway inflammation. No central obstructing mass. Small amount of layering debris noted within the right mainstem bronchus and bronchus intermedius. Minimal patchy infiltrate also noted within the left upper lobe. No pneumothorax or pleural effusion. Musculoskeletal: No acute bone abnormality. No lytic or blastic bone lesion. Review of the MIP images confirms the above findings. CTA ABDOMEN AND PELVIS FINDINGS VASCULAR Aorta: Fusiform infrarenal abdominal aortic aneurysm has undergone endovascular repair utilizing a bifurcated stent graft with infrarenal proximal fixation and bilateral common iliac distal fixation. The aneurysm sac is unchanged in size when measured in identical fashion measuring 5.4 x 4.4 cm at axial image # 173/7 (measuring identically on image # 66/7 of previous examination). The repaired segment appears  patent. Superimposed extensive atherosclerotic calcification. No periaortic inflammatory change or fluid collections are identified. Celiac: Less than 50% stenosis of the origin, unchanged. Distally widely patent. Variant anatomy with the celiac axis supplying only the left gastric artery and splenic artery. SMA: Replaced common hepatic artery. Less than 50% stenosis of the origin. Distally widely patent. No aneurysm or dissection. Renals: Dual renal arteries bilaterally with accessory lower pole renal arteries noted. 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 Normal vascular morphology. No aneurysm or dissection. 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 Distally widely patent. Inflow: No evidence of hemodynamically significant stenosis involving the lower extremity arterial inflow bilaterally. Internal iliac arteries are  stenotic at their origins, but are patent bilaterally. Veins: No obvious venous abnormality within the limitations of this arterial phase study. Review of the MIP images confirms the above findings. NON-VASCULAR Hepatobiliary: No focal liver abnormality is seen. Status post cholecystectomy. No biliary dilatation. Pancreas: Unremarkable Spleen: Unremarkable Adrenals/Urinary Tract: The adrenal glands are unremarkable. The kidneys are normal. Bladder is unremarkable. Stomach/Bowel: Moderate descending and sigmoid colonic diverticulosis without superimposed acute inflammatory change. The stomach, small bowel, and large bowel are otherwise unremarkable. Appendix normal. No free intraperitoneal gas or fluid. Lymphatic: No pathologic adenopathy within the abdomen and pelvis. Reproductive: Mild prostatic enlargement Other: No abdominal wall hernia Musculoskeletal: Left total hip arthroplasty and L4-5 hemilaminectomies and lumbar discectomy with interbody bone cage placement has been performed. No definite bridging callus identified at L4-5. No acute bone abnormality. No lytic or blastic bone lesion. Review of the MIP images confirms the above findings. IMPRESSION: 1. No evidence of thoracoabdominal aortic dissection. 2. Extensive coronary artery calcification. Status post transcatheter aortic valve replacement. 3. Multifocal pulmonary infiltrate, most severe within the superior segment of the right lower lobe, in keeping with acute infection in the appropriate clinical setting. Associated bronchial wall thickening in keeping with airway inflammation. No central obstructing mass identified. 4. Stable changes of pulmonary arterial hypertension. 5. Stable infrarenal abdominal aortic aneurysm status post endovascular repair. Stable aneurysm sac size at 5.4 cm. 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 6. Bilateral renal artery hemodynamically significant stenoses, involving the main right renal artery and both the main as well as accessory  lower polar left renal arteries. 7. Moderate distal colonic diverticulosis without superimposed acute inflammatory change. 8. Postsurgical changes of L4-5 hemilaminectomies and lumbar discectomy with interbody bone cage placement. No definite bridging callus identified at L4-5. Electronically Signed   By: Helyn Numbers M.D.   On: 08/17/2021 23:13    Microbiology: Results for orders placed or performed during the hospital encounter of 08/17/21  Culture, blood (routine x 2)     Status: None   Collection Time: 08/17/21  7:41 PM   Specimen: BLOOD RIGHT FOREARM  Result Value Ref Range Status   Specimen Description BLOOD RIGHT FOREARM  Final   Special Requests   Final    BOTTLES DRAWN AEROBIC AND ANAEROBIC Blood Culture results may not be optimal due to an excessive volume of blood received in culture bottles   Culture   Final    NO GROWTH 5 DAYS Performed at [facility], [address] [place], [place] [address]    Report Status 08/22/2021 FINAL  Final  Culture, blood (routine x 2)     Status: None   Collection Time: 08/17/21  7:52 PM  Specimen: BLOOD RIGHT HAND  Result Value Ref Range Status   Specimen Description BLOOD RIGHT HAND  Final   Special Requests   Final    BOTTLES DRAWN AEROBIC AND ANAEROBIC Blood Culture adequate volume   Culture   Final    NO GROWTH 5 DAYS Performed at [facility], [address], [place], [place] [address]    Report Status 08/22/2021 FINAL  Final  MRSA Next Gen by PCR, Nasal     Status: None   Collection Time: 08/18/21  3:18 PM   Specimen: Nasal Mucosa; Nasal Swab  Result Value Ref Range Status   MRSA by PCR Next Gen NOT DETECTED NOT DETECTED Final    Comment: (NOTE) The GeneXpert MRSA Assay (FDA approved for NASAL specimens only), is one component of a comprehensive MRSA colonization surveillance program. It is not intended to diagnose MRSA infection nor to guide or monitor treatment for MRSA infections. Test performance is not FDA  approved in patients less than 41 years old. Performed at [facility], [address], [place], [place] [address]    *Note: Due to a large number of results and/or encounters for the requested time period, some results have not been displayed. A complete set of results can be found in Results Review.   Labs: CBC: Recent Labs  Lab 08/17/21 1811 08/17/21 1820 08/18/21 0518 08/18/21 0548 08/18/21 1642 08/19/21 0026 08/20/21 0931 08/21/21 0058  WBC 8.5  --  7.1  --   --  5.6 6.5 6.1  NEUTROABS 7.6  --  6.3  --   --  5.1 5.9 5.5  HGB 7.6*   < > 8.0* 8.2* 8.5* 8.1* 8.1* 8.0*  HCT 24.4*   < > 25.7* 24.0* 26.3* 25.2* 25.4* 25.2*  MCV 91.4  --  91.5  --   --  87.8 88.2 90.3  PLT 102*  --  83*  --   --  75* 94* 99*   < > = values in this interval not displayed.   Basic Metabolic Panel: Recent Labs  Lab 08/17/21 1811 08/17/21 1820 08/18/21 0239 08/18/21 0518 08/18/21 0548 08/19/21 0026 08/20/21 0931 08/21/21 0058  NA 134* 133* 134*  --  135 137 138 137  K 5.3* 5.3* 5.0  --  5.0 4.6 4.4 5.3*  CL 104 103 104  --   --  110 111 103  CO2 22  --  22  --   --  20* 22 25  GLUCOSE 94 82 88  --   --  192* 156* 147*  BUN 63* 69* 59*  --   --  36* 41* 44*  CREATININE 2.46* 2.60* 1.78*  --   --  0.86 0.92 1.05  CALCIUM 7.1*  --  7.5*  --   --  8.4* 8.7* 8.8*  MG  --   --  1.3* 1.4*  --  1.6* 2.3 2.2  PHOS  --   --   --   --   --  2.6 4.0 4.6   Liver Function Tests: Recent Labs  Lab 08/17/21 1811 08/18/21 0239 08/19/21 0026 08/20/21 0931 08/21/21 0058  AST 23 30 21  14* 15  ALT 15 17 16 16 17   ALKPHOS 71 91 89 82 79  BILITOT 0.8 0.6 0.6 0.5 0.6  PROT 4.4* 5.0* 5.0* 5.2* 5.1*  ALBUMIN 2.3* 2.5* 2.5* 2.5* 2.5*   CBG: No results for input(s): "GLUCAP" in the last 168 hours.  Discharge time spent: greater  than 30 minutes.  Signed: Marguerita Merles, DO [facility] 08/22/2021

## 2021-08-21 NOTE — Evaluation (Signed)
Occupational Therapy Evaluation and Discharge Patient Details Name: Brandon [name] MRN: [id number] DOB: August 21, 1940 Today's Date: 08/21/2021   History of Present Illness Pt is a 81 yo male presenting with worsening lower back pain. He is admitted with sepsis and acute respiratory failure from PNA. Pt reportedly choked on his pills on 7/15. Pt was previously followed in 2020 by OP SLP after surgery and XRT for lingual cancer (onset 2019). PMH includes: GERD, esophageal stretching, AAA s/p stent, HTN, HLD, aortic stenosis s/p TAVR, chronic low back pain s/p L4-L5 hemilaminectomy and discectomy, CHF, anemia, tonsillar/lingual ca (s/p transhyoid/transcervical resection of the tonsil and tongue base with left neck dissection on 12/02/17. 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, requiring two returns to the OR;  scar tissue release and tongue repair with [facility] esophageal dilation)   Clinical Impression   This 81 yo male admitted with above presents to acute OT at an independent to Mod I level for all basic ADLs with O2 sats on RA staying stable (not going lower than 94%). No further OT needs, we will sign off.      Recommendations for follow up therapy are one component of a multi-disciplinary discharge planning process, led by the attending physician.  Recommendations may be updated based on patient status, additional functional criteria and insurance authorization.   Follow Up Recommendations  No OT follow up    Assistance Recommended at Discharge PRN     Functional Status Assessment  Patient has not had a recent decline in their functional status  Equipment Recommendations  None recommended by OT       Precautions / Restrictions Precautions Precautions: Fall;None Restrictions Weight Bearing Restrictions: No      Mobility Bed Mobility Overal bed mobility: Independent                  Transfers Overall transfer level: Independent                  General transfer comment: pt ambulated 100 feet with RW Mod I      Balance Overall balance assessment: No apparent balance deficits (not formally assessed) (with use of RW)                                         ADL either performed or assessed with clinical judgement   ADL Overall ADL's : Modified independent                                             Vision Patient Visual Report: No change from baseline              Pertinent Vitals/Pain Pain Assessment Pain Assessment: Faces Faces Pain Scale: Hurts a little bit Pain Location: back Pain Descriptors / Indicators: Discomfort Pain Intervention(s): Monitored during session, Limited activity within patient's tolerance     Hand Dominance Right   Extremity/Trunk Assessment Upper Extremity Assessment Upper Extremity Assessment: Overall WFL for tasks assessed           Communication Communication Communication: HOH   Cognition Arousal/Alertness: Awake/alert Behavior During Therapy: WFL for tasks assessed/performed Overall Cognitive Status: Within Functional Limits for tasks assessed  General Comments  Sats remained 94% or better throughout entire session            [place] expects to be discharged to:: Private residence Living Arrangements: Spouse/significant other Available Help at Discharge: Family;Available 24 hours/day Type of Home: House Home Access: Stairs to enter [employer] of Steps: 4 Entrance Stairs-Rails: Can reach both Home Layout: Two level;Able to live on main level with bedroom/bathroom     Bathroom Shower/Tub: Occupational psychologist: Standard Bathroom Accessibility: Yes   Home Equipment: [place] (2 wheels);Grab bars - tub/shower;Shower seat - built in;BSC/3in1          Prior Functioning/Environment Prior Level of Function :  Independent/Modified Independent;Driving;History of Falls (last six months)             Mobility Comments: Uses quad-cane majority of time but otherwise holds onto furniture. Hx of several falls recently. ADLs Comments: Manages own meds, cleans, cooks, etc. [name] reports pt has had a progressive decline in cognition recently with memory deficits at baseline                 OT Goals(Current goals can be found in the care plan section) Acute Rehab OT Goals Patient Stated Goal: to go home soon         AM-PAC OT "6 Clicks" Daily Activity     Outcome Measure Help from another person eating meals?: None Help from another person taking care of personal grooming?: None Help from another person toileting, which includes using toliet, bedpan, or urinal?: None Help from another person bathing (including washing, rinsing, drying)?: None Help from another person to put on and taking off regular upper body clothing?: None Help from another person to put on and taking off regular lower body clothing?: None 6 Click Score: 24   End of Session Equipment Utilized During Treatment: Gait belt;Rolling walker (2 wheels)  Activity Tolerance: Patient tolerated treatment well Patient left: in bed;with call bell/phone within reach  OT Visit Diagnosis: Muscle weakness (generalized) (M62.81)                Time: 6629-4765 OT Time Calculation (min): 12 min Charges:  OT General Charges $OT Visit: 1 Visit OT Evaluation $OT Eval Moderate Complexity: 1 Mod  [place] Circle, OTR/L Acute Rehab Services [facility] [place] [phone] Office [phone]    Almon Register 08/21/2021, 4:50 PM

## 2021-08-21 NOTE — Progress Notes (Signed)
Modified Barium Swallow Progress Note  Patient Details  Name: Brandon [name] MRN: [id number] Date of Birth: Jun 09, 1940  Today's Date: 08/21/2021  Modified Barium Swallow completed.  Full report located under Chart Review in the Imaging Section.  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 He has impaired mastication with some lingual residue of solids that is mild. Lingual pumping is noted at times. He does not achieve consistent velopharyngeal closure, with nasal reflux observed only with thin liquids. Penetration also occurred with thin liquids, and across all consistencies he has significant vallecular residue. This is a result of limited hyolaryngeal movement. base of tongue retraction, and laryngeal vestibule closure. Dry swallows help to reduce pharyngeal residue, although pt prefers to use liqui washes. Penetration and nasal regurgitation seem to occur the most when he is using liquids to clear solids that already fill his valleculae; however, during this study, the majority of nasal reflux spills back down into the pharynx and trace penetration can be cleared with a cued throat clear. Note that barium tablet also appeared to get slowed in the distal esophagus, not fully clearing. Education was provided to pt with recommendations to remain on Dys 3 diet and thin liquids, but would use aspiration and esophageal precautions. In addition to basic precautions, would also implement a throat clear post-swallow and crush larger pills.   Swallow Evaluation Recommendations       SLP Diet Recommendations: Dysphagia 3 (Mech soft) solids;Thin liquid   Liquid Administration via: Cup;Straw   Medication Administration: Whole meds with liquid (crush if larger)   Supervision: Patient able to self  feed;Intermittent supervision to cue for compensatory strategies   Compensations: Slow rate;Small sips/bites;Follow solids with liquid;Clear throat after each swallow   Postural Changes: Seated upright at 90 degrees;Remain semi-upright after after feeds/meals (Comment)   Oral Care Recommendations: Oral care BID        Osie Bond., M.A. [place] Office [phone]  Secure chat preferred  08/21/2021,3:40 PM

## 2021-08-21 NOTE — Progress Notes (Signed)
Explained discharge instructions. Patient verbalized having an understanding of the instructions given. Reviewed medication administration times and follow up appointments. TOC medications and patient belongings are in the patient's possession. IV was removed by floor staff. Removed telemetry. Notified CCMD

## 2021-08-21 NOTE — TOC Initial Note (Signed)
Transition of Care [facility]) - Initial/Assessment Note    Patient Details  Name: Brandon [name] MRN: [id number] Date of Birth: 1940-12-13  Transition of Care [facility]) CM/SW Contact:    Angelita Ingles, RN Phone [phone]  08/21/2021, 12:31 PM  Clinical Narrative:                 TOC following patient with high risk for readmission. CM at bedside for assessment. Patient is from home with his spouse where he reports that he is able to function independently with the support of his wife. Patient reports that his PCP is Dr. Gena Fray and patient follows up with his physician on a regular basis. Patient states pharmacy of choice is [facility]. Patient reports that he does have a walker and cane at home. Patient reports that hew does have transportation to appointments and MD visits.   Patient has home health recommendation. CM offered patient choice for home health agency. Patient states that he has no preference as long as insurance will cover. Home health referral has been accepted by [facility] with Alvis Lemmings. Home Health orders have been entered  Expected Discharge Plan: [place] Barriers to Discharge: Continued Medical Work up   Patient Goals and CMS Choice Patient states their goals for this hospitalization and ongoing recovery are:: Wants to go home today CMS Medicare.gov Compare Post Acute Care list provided to:: Patient Choice offered to / list presented to : Patient  Expected Discharge Plan and Services Expected Discharge Plan: [place] In-house Referral: NA Discharge Planning Services: CM Consult Post Acute Care Choice: [place] arrangements for the past 2 months: Single Family Home                 DME Arranged: N/A                    Prior Living Arrangements/Services Living arrangements for the past 2 months: Single Family Home Lives with:: Spouse Patient language and need for interpreter reviewed:: Yes        Need for Family  Participation in Patient Care: Yes (Comment) Care giver support system in place?: Yes (comment) Current home services: DME (walker and cane) Criminal Activity/Legal Involvement Pertinent to Current Situation/Hospitalization: No - Comment as needed  Activities of Daily Living   ADL Screening (condition at time of admission) Patient's cognitive ability adequate to safely complete daily activities?: Yes Is the patient deaf or have difficulty hearing?: Yes Does the patient have difficulty seeing, even when wearing glasses/contacts?: No Does the patient have difficulty concentrating, remembering, or making decisions?: No Patient able to express need for assistance with ADLs?: Yes  Permission Sought/Granted                  Emotional Assessment Appearance:: Appears stated age Attitude/Demeanor/Rapport: Gracious Affect (typically observed): Pleasant Orientation: : Oriented to Self, Oriented to Place, Oriented to  Time, Oriented to Situation Alcohol / Substance Use: Not Applicable Psych Involvement: No (comment)  Admission diagnosis:  Hyperkalemia [E87.5] AKI (acute kidney injury) ([place]) [N17.9] Anemia, unspecified type [D64.9] Hypotension due to hypovolemia [I95.89, E86.1] Patient Active Problem List   Diagnosis Date Noted   CAP (community acquired pneumonia) 08/18/2021   Hypotension 08/18/2021   AKI (acute kidney injury) ([place]) 08/17/2021   ARF (acute renal failure) ([place]) 06/26/2021   Hyponatremia 06/26/2021   Hyperkalemia 73/41/9379   Acute metabolic encephalopathy 02/40/9735   Myoclonic jerking 06/26/2021   Lumbar postlaminectomy syndrome 05/03/2021  Degeneration of lumbar intervertebral disc 05/03/2021   Lumbar spondylosis 05/03/2021   History of hip fracture 04/24/2021   S/P abdominal aortic aneurysm repair 04/24/2021   Chronic allergic rhinitis 01/24/2021   Demand ischemia (HCC)    Acute respiratory failure with hypoxia (HCC)    Elevated troponin    Chronic  diastolic heart failure ([place]) 12/03/2020   Steatosis of liver 11/01/2020   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   Hepatitis B core antibody positive 11/01/2020   Benign prostatic hyperplasia 11/01/2020   Chronic obstructive pulmonary disease ([place]) 07/20/2020   History of placement of stent in LAD coronary artery 07/20/2020   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   TIA (transient ischemic attack)    Esophageal stricture    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   Primary osteoarthritis of both hands 01/11/2020   Piriformis syndrome of both sides 09/23/2019   Pain of both shoulder joints 07/29/2019   Sciatica associated with disorder of lumbar spine 04/24/2019   Primary osteoarthritis involving multiple joints 04/24/2019   Chronic pain syndrome 04/24/2019   Depression 04/15/2018   Cancer of tonsillar fossa ([place]) 09/20/2017   Liver abscess 07/10/2016   Acute on chronic anemia 07/10/2016   Essential hypertension 05/19/2013   Osteoarthritis 05/29/2009   Psoriasis 01/13/2009   Coronary artery disease 09/15/2008   Diverticulosis of colon 08/27/2007   History of benign prostatic hyperplasia 08/27/2007   Hyperlipidemia 03/19/2007   Gastroesophageal reflux disease 03/19/2007   PCP:  Haydee Salter, MD Pharmacy:   [facility] ORDER) [place], [place] [address] Phone: [phone] Fax: [place] [place] [address] [place] [address] Phone: [phone] Fax: [phone]  [facility] [address] [address] Phone: [phone] Fax: [phone]     Social Determinants of Health (SDOH) Interventions    Readmission Risk Interventions    08/21/2021   12:25 PM  Readmission Risk  Prevention Plan  Transportation Screening Complete  Medication Review ([place]) Complete  PCP or Specialist appointment within 3-5 days of discharge Complete  HRI or [place] Not Applicable

## 2021-08-21 NOTE — Progress Notes (Signed)
SATURATION QUALIFICATIONS: (This note is used to comply with regulatory documentation for home oxygen)  Patient Saturations on Room Air at Rest = 97%  Patient Saturations on Room Air while Ambulating = 92%  Patient Saturations on 1 Liters of oxygen while Ambulating = 97%  Please briefly explain why patient needs home oxygen:

## 2021-08-21 NOTE — Care Management Important Message (Signed)
Important Message  Patient Details  Name: Brandon [name] MRN: [id number] Date of Birth: [birth year]   Medicare Important Message Given:  Yes     Mostafa Yuan [address] 08/21/2021, 3:11 PM

## 2021-08-22 ENCOUNTER — Other Ambulatory Visit (HOSPITAL_COMMUNITY): Payer: Self-pay

## 2021-08-22 ENCOUNTER — Telehealth: Payer: Self-pay

## 2021-08-22 LAB — CULTURE, BLOOD (ROUTINE X 2)
Culture: NO GROWTH
Culture: NO GROWTH
Special Requests: ADEQUATE

## 2021-08-22 NOTE — TOC Progression Note (Signed)
Transition of Care [facility]) - Progression Note    Patient Details  Name: Brandon [name] MRN: [id number] Date of Birth: [birth year]  Transition of Care [facility]) CM/SW Contact  Angelita Ingles, RN Phone Number:[phone]  08/22/2021, 10:10 AM  Clinical Narrative:    CM received call from wife Kassim Guertin stating that patient is missing 3 meds that are listed on the discharge summary. Meds included are azithromycin, cefdinir & ipratropium. CM called [facility] who confirms meds were delivered tot he patient. CM called wife back to make her aware and wife now states that they do have the medications and that she should have double checked before calling case manager. CM has explained to wife that it is ok. CM just wanted to make sure patient has meds as indicated on his discharge paperwork. Wife confirms again that patient has all medications. No other needs noted at this time.    Expected Discharge Plan: [place] Barriers to Discharge: Continued Medical Work up  Expected Discharge Plan and Services Expected Discharge Plan: [place] In-house Referral: NA Discharge Planning Services: CM Consult Post Acute Care Choice: [place] arrangements for the past 2 months: Single Family Home Expected Discharge Date: 08/21/21               DME Arranged: N/A                     Social Determinants of Health (SDOH) Interventions    Readmission Risk Interventions    08/21/2021   12:25 PM  Readmission Risk Prevention Plan  Transportation Screening Complete  Medication Review ([place]) Complete  PCP or Specialist appointment within 3-5 days of discharge Complete  HRI or [place] Complete  SW Recovery Care/Counseling Consult Complete  [place] Not Applicable

## 2021-08-22 NOTE — TOC Progression Note (Signed)
Transition of Care [facility]) - Progression Note    Patient Details  Name: Brandon [name] MRN: [id number] Date of Birth: 21-Jul-1940  Transition of Care [facility]) CM/SW [place], RN Phone Number:[phone]  08/22/2021, 9:21 AM  Clinical Narrative:    Cm received message stating that patient does not have nebulizer machine. Patient discharged home with nebulizer machine. CM called order to [facility] with [facility].  Nebulizer machine to be delivered as soon as possible. CM called wife Prentiss Polio to make her aware.    Expected Discharge Plan: [place] Barriers to Discharge: Continued Medical Work up  Expected Discharge Plan and Services Expected Discharge Plan: [place] In-house Referral: NA Discharge Planning Services: CM Consult Post Acute Care Choice: [place] arrangements for the past 2 months: Single Family Home Expected Discharge Date: 08/21/21               DME Arranged: N/A                     Social Determinants of Health (SDOH) Interventions    Readmission Risk Interventions    08/21/2021   12:25 PM  Readmission Risk Prevention Plan  Transportation Screening Complete  Medication Review ([place]) Complete  PCP or Specialist appointment within 3-5 days of discharge Complete  HRI or [place] Complete  SW Recovery Care/Counseling Consult Complete  [place] Not Applicable

## 2021-08-22 NOTE — Telephone Encounter (Signed)
Transition Care Management Unsuccessful Follow-up Telephone Call  Date of discharge and from where:  08/21/2021  Brandon [name]   Attempts:  1st Attempt  Reason for unsuccessful TCM follow-up call:  Left voice message

## 2021-08-23 ENCOUNTER — Telehealth: Payer: Self-pay | Admitting: Family Medicine

## 2021-08-23 DIAGNOSIS — M545 Low back pain, unspecified: Secondary | ICD-10-CM | POA: Diagnosis not present

## 2021-08-23 DIAGNOSIS — A419 Sepsis, unspecified organism: Secondary | ICD-10-CM | POA: Diagnosis not present

## 2021-08-23 DIAGNOSIS — K449 Diaphragmatic hernia without obstruction or gangrene: Secondary | ICD-10-CM | POA: Diagnosis not present

## 2021-08-23 DIAGNOSIS — I714 Abdominal aortic aneurysm, without rupture, unspecified: Secondary | ICD-10-CM | POA: Diagnosis not present

## 2021-08-23 DIAGNOSIS — B181 Chronic viral hepatitis B without delta-agent: Secondary | ICD-10-CM | POA: Diagnosis not present

## 2021-08-23 DIAGNOSIS — E785 Hyperlipidemia, unspecified: Secondary | ICD-10-CM | POA: Diagnosis not present

## 2021-08-23 DIAGNOSIS — D696 Thrombocytopenia, unspecified: Secondary | ICD-10-CM | POA: Diagnosis not present

## 2021-08-23 DIAGNOSIS — I5032 Chronic diastolic (congestive) heart failure: Secondary | ICD-10-CM | POA: Diagnosis not present

## 2021-08-23 DIAGNOSIS — K222 Esophageal obstruction: Secondary | ICD-10-CM | POA: Diagnosis not present

## 2021-08-23 DIAGNOSIS — F32A Depression, unspecified: Secondary | ICD-10-CM | POA: Diagnosis not present

## 2021-08-23 DIAGNOSIS — M1990 Unspecified osteoarthritis, unspecified site: Secondary | ICD-10-CM | POA: Diagnosis not present

## 2021-08-23 DIAGNOSIS — N179 Acute kidney failure, unspecified: Secondary | ICD-10-CM | POA: Diagnosis not present

## 2021-08-23 DIAGNOSIS — R6520 Severe sepsis without septic shock: Secondary | ICD-10-CM | POA: Diagnosis not present

## 2021-08-23 DIAGNOSIS — J9601 Acute respiratory failure with hypoxia: Secondary | ICD-10-CM | POA: Diagnosis not present

## 2021-08-23 DIAGNOSIS — F419 Anxiety disorder, unspecified: Secondary | ICD-10-CM | POA: Diagnosis not present

## 2021-08-23 DIAGNOSIS — I110 Hypertensive heart disease with heart failure: Secondary | ICD-10-CM | POA: Diagnosis not present

## 2021-08-23 DIAGNOSIS — G8929 Other chronic pain: Secondary | ICD-10-CM | POA: Diagnosis not present

## 2021-08-23 DIAGNOSIS — I959 Hypotension, unspecified: Secondary | ICD-10-CM | POA: Diagnosis not present

## 2021-08-23 DIAGNOSIS — E875 Hyperkalemia: Secondary | ICD-10-CM | POA: Diagnosis not present

## 2021-08-23 DIAGNOSIS — K5790 Diverticulosis of intestine, part unspecified, without perforation or abscess without bleeding: Secondary | ICD-10-CM | POA: Diagnosis not present

## 2021-08-23 DIAGNOSIS — J189 Pneumonia, unspecified organism: Secondary | ICD-10-CM | POA: Diagnosis not present

## 2021-08-23 DIAGNOSIS — N400 Enlarged prostate without lower urinary tract symptoms: Secondary | ICD-10-CM | POA: Diagnosis not present

## 2021-08-23 DIAGNOSIS — I700 Atherosclerosis of aorta: Secondary | ICD-10-CM | POA: Diagnosis not present

## 2021-08-23 DIAGNOSIS — D649 Anemia, unspecified: Secondary | ICD-10-CM | POA: Diagnosis not present

## 2021-08-23 NOTE — Telephone Encounter (Signed)
Spoke to [employer] w/ [facility],  wanting [place] for 2x a week for 2 weeks and then 1x week for 3 weeks.  She will fax something over to [place] since PT/OT is still pending for them to go out for first evaluation.   The OTC ear drops can be used as well as the benadryl as needed.  And Zetia is at the [facility] was sent in in April with 5 refills.  She will let patient know. Dm/cma

## 2021-08-23 NOTE — Telephone Encounter (Signed)
Brandon [name] from [place] [phone] needs HH orders. She also is wondering If the pt should continue with the Zetia 10 mg and if so he needs a refill. The pt is requesting a suggestion for OTC eardrops. Can he take benadryl? Please advise.

## 2021-08-23 NOTE — Telephone Encounter (Signed)
Transition Care Management Unsuccessful Follow-up Telephone Call  Date of discharge and from where:  08/21/2021  [facility]   Attempts:  2nd Attempt  Reason for unsuccessful TCM follow-up call:  No answer/busy

## 2021-08-26 ENCOUNTER — Other Ambulatory Visit: Payer: Self-pay | Admitting: Physician Assistant

## 2021-08-28 ENCOUNTER — Telehealth: Payer: Self-pay | Admitting: *Deleted

## 2021-08-28 NOTE — Telephone Encounter (Signed)
Brandon [name] is calling for his refill on his oxycodone. Per PMP last refill was 08/02/21

## 2021-08-29 ENCOUNTER — Other Ambulatory Visit: Payer: Self-pay | Admitting: Physical Medicine and Rehabilitation

## 2021-08-29 MED ORDER — OXYCODONE HCL 10 MG PO TABS: 10.0000 mg | ORAL_TABLET | ORAL | 0 refills | Status: DC | PRN

## 2021-09-01 ENCOUNTER — Ambulatory Visit (INDEPENDENT_AMBULATORY_CARE_PROVIDER_SITE_OTHER): Payer: Medicare Other | Admitting: Family Medicine

## 2021-09-01 ENCOUNTER — Encounter: Payer: Medicare Other | Admitting: Physical Medicine and Rehabilitation

## 2021-09-01 ENCOUNTER — Encounter: Payer: Self-pay | Admitting: Family Medicine

## 2021-09-01 VITALS — BP 114/60 | HR 90 | Temp 98.0°F | Ht 66.5 in | Wt 168.0 lb

## 2021-09-01 DIAGNOSIS — I10 Essential (primary) hypertension: Secondary | ICD-10-CM

## 2021-09-01 DIAGNOSIS — N179 Acute kidney failure, unspecified: Secondary | ICD-10-CM | POA: Diagnosis not present

## 2021-09-01 DIAGNOSIS — J189 Pneumonia, unspecified organism: Secondary | ICD-10-CM

## 2021-09-01 DIAGNOSIS — I5032 Chronic diastolic (congestive) heart failure: Secondary | ICD-10-CM | POA: Diagnosis not present

## 2021-09-01 DIAGNOSIS — J449 Chronic obstructive pulmonary disease, unspecified: Secondary | ICD-10-CM

## 2021-09-01 LAB — CBC
HCT: 31.6 % — ABNORMAL LOW (ref 39.0–52.0)
Hemoglobin: 10.2 g/dL — ABNORMAL LOW (ref 13.0–17.0)
MCHC: 32.3 g/dL (ref 30.0–36.0)
MCV: 89.3 fl (ref 78.0–100.0)
Platelets: 103 10*3/uL — ABNORMAL LOW (ref 150.0–400.0)
RBC: 3.53 Mil/uL — ABNORMAL LOW (ref 4.22–5.81)
RDW: 17.9 % — ABNORMAL HIGH (ref 11.5–15.5)
WBC: 6.8 10*3/uL (ref 4.0–10.5)

## 2021-09-01 LAB — COMPREHENSIVE METABOLIC PANEL
ALT: 14 U/L (ref 0–53)
AST: 12 U/L (ref 0–37)
Albumin: 3.7 g/dL (ref 3.5–5.2)
Alkaline Phosphatase: 90 U/L (ref 39–117)
BUN: 16 mg/dL (ref 6–23)
CO2: 32 mEq/L (ref 19–32)
Calcium: 8.7 mg/dL (ref 8.4–10.5)
Chloride: 98 mEq/L (ref 96–112)
Creatinine, Ser: 0.73 mg/dL (ref 0.40–1.50)
GFR: 86.9 mL/min (ref >60.00)
Glucose, Bld: 162 mg/dL — ABNORMAL HIGH (ref 70–99)
Potassium: 4.3 mEq/L (ref 3.5–5.1)
Sodium: 136 mEq/L (ref 135–145)
Total Bilirubin: 0.5 mg/dL (ref 0.2–1.2)
Total Protein: 5.7 g/dL — ABNORMAL LOW (ref 6.0–8.3)

## 2021-09-01 LAB — MAGNESIUM: Magnesium: 1.5 mg/dL (ref 1.5–2.5)

## 2021-09-01 LAB — PHOSPHORUS: Phosphorus: 2 mg/dL — ABNORMAL LOW (ref 2.3–4.6)

## 2021-09-01 NOTE — Progress Notes (Signed)
[facility] [address] [place] [address] Dept: [phone] Dept Fax: [phone]  Office Visit  Subjective:    Patient ID: Brandon [name], male    DOB: Jun 22, 1940, 81 y.o..   MRN: [id number]  Chief Complaint  Patient presents with   Hospitalization [facility] f/u for Pneumonia.      History of Present Illness:  Patient is in today for follow up from a recent hospitalization. He was admitted at [facility] on 7/13-7/17/2023 with acute kidney injury, acute respiratory failure and community-acquired pneumonia. [name] notes he had initially gone in because of sharp lower right back pain. He had been seeing an orthopedist at [facility] who had planned an MRI scan. Mr Brandon [name] was surprised to learn about the pneumonia. He states that the concern is that he has some recurrent aspiration issues. 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 He did have speech therapy int he past for swallowing training.  [name] notes he is still having low back pain. He had also been running a lower blood pressure. He is concerned that he may need to back off on his BP meds. He has an appointment next week with his cardiologist.   Past Medical History: Patient Active Problem List   Diagnosis Date Noted   Hypotension 08/18/2021   AKI (acute kidney injury) ([place]) 08/17/2021   Hyponatremia 06/26/2021   Hyperkalemia 58/85/0277   Acute metabolic encephalopathy 41/28/7867   Myoclonic jerking 06/26/2021   Lumbar postlaminectomy syndrome 05/03/2021   Degeneration of lumbar intervertebral disc 05/03/2021   Lumbar spondylosis 05/03/2021   History of hip fracture 04/24/2021   S/P abdominal aortic aneurysm repair 04/24/2021   Chronic allergic rhinitis 01/24/2021   Demand ischemia ([place])    Acute respiratory failure with hypoxia (HCC)    Elevated troponin    Chronic diastolic heart  failure ([place]) 12/03/2020   Steatosis of liver 11/01/2020   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   Hepatitis B core antibody positive 11/01/2020   Benign prostatic hyperplasia 11/01/2020   Chronic obstructive pulmonary disease ([place]) 07/20/2020   History of placement of stent in LAD coronary artery 07/20/2020   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   TIA (transient ischemic attack)    Esophageal stricture    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[place]) 04/19/2020   Primary osteoarthritis of both hands 01/11/2020   Piriformis syndrome of both sides 09/23/2019   Pain of both shoulder joints 07/29/2019   Sciatica associated with disorder of lumbar spine 04/24/2019   Primary osteoarthritis involving multiple joints 04/24/2019   Chronic pain syndrome 04/24/2019   Depression 04/15/2018   Cancer of tonsillar fossa ([place]) 09/20/2017   Liver abscess 07/10/2016   Acute on chronic anemia 07/10/2016   Essential hypertension 05/19/2013   Osteoarthritis 05/29/2009   Psoriasis 01/13/2009   Coronary artery disease 09/15/2008   Diverticulosis of colon 08/27/2007   History of benign prostatic hyperplasia 08/27/2007   Hyperlipidemia 03/19/2007   Gastroesophageal reflux disease 03/19/2007   Past Surgical History:  Procedure Laterality Date   ABDOMINAL AORTIC ENDOVASCULAR STENT GRAFT N/A 03/03/2021   Procedure: ABDOMINAL AORTIC ENDOVASCULAR STENT GRAFT;  Surgeon: Waynetta Sandy, MD;  Location: [facility];  Service: Vascular;  Laterality: N/A;   ABDOMINAL AORTOGRAM W/LOWER EXTREMITY N/A 04/10/2021   Procedure: ABDOMINAL AORTOGRAM W/LOWER EXTREMITY;  Surgeon: Waynetta Sandy, MD;  Location: [facility];  Service: Cardiovascular;  Laterality: N/A;   BACK SURGERY     CARDIAC CATHETERIZATION     CATARACT EXTRACTION W/ INTRAOCULAR LENS  IMPLANT, BILATERAL Bilateral 01/2012 - 02/2012   CORONARY ATHERECTOMY N/A 04/27/2020   Procedure:  CORONARY ATHERECTOMY;  Surgeon: Sherren Mocha, MD;  Location: [facility];  Service: Cardiovascular;  Laterality: N/A;   CORONARY STENT INTERVENTION N/A 04/27/2020   Procedure: CORONARY STENT INTERVENTION;  Surgeon: Sherren Mocha, MD;  Location: [place] [facility];  Service: Cardiovascular;  Laterality: N/A;   DIRECT LARYNGOSCOPY Left 10/09/2017   Procedure: DIRECT LARYNGOSCOPY WITH BOPSY;  Surgeon: Jodi Marble, MD;  Location: [facility];  Service: ENT;  Laterality: Left;   ESOPHAGOGASTRODUODENOSCOPY (EGD) WITH ESOPHAGEAL DILATION     "couple times" (07/19/2016)   ESOPHAGOSCOPY Left 10/09/2017   Procedure: ESOPHAGOSCOPY;  Surgeon: Jodi Marble, MD;  Location: [place] [facility];  Service: ENT;  Laterality: Left;   INTRAVASCULAR IMAGING/OCT N/A 04/27/2020   Procedure: INTRAVASCULAR IMAGING/OCT;  Surgeon: Sherren Mocha, MD;  Location: [facility];  Service: Cardiovascular;  Laterality: N/A;   INTRAVASCULAR PRESSURE WIRE/FFR STUDY N/A 04/27/2020   Procedure: INTRAVASCULAR PRESSURE WIRE/FFR STUDY;  Surgeon: Sherren Mocha, MD;  Location: [place] [facility];  Service: Cardiovascular;  Laterality: N/A;   IR GASTROSTOMY TUBE MOD SED  01/08/2018   IR THORACENTESIS ASP PLEURAL SPACE W/IMG GUIDE  07/19/2016   LAPAROSCOPIC CHOLECYSTECTOMY  1984   LUMBAR [place] SURGERY  05/1996   L4-5; Dr. Claudean Kinds LAMINECTOMY/DECOMPRESSION MICRODISCECTOMY  10/2002   L3-4. Dr. Sherwood Gambler   MULTIPLE TOOTH EXTRACTIONS  1980s   PARTIAL GLOSSECTOMY  12/02/2017   Dr. Nicolette [facility]- [facility]   pharyngoplasty for closure of tingue base defect  12/02/2017   Dr. Nicolette Bang- [place]  07/15/2016   POSTERIOR LUMBAR FUSION  09/1996   Ray cage, L4-5 Dr. Rita Ohara   PROSTATE BIOPSY  ~ 2017   radical tonsillectomy Left 12/02/2017   Dr. Nicolette Bang at [place] N/A 03/31/2020   Procedure: RIGHT HEART CATH AND CORONARY ANGIOGRAPHY;   Surgeon: Larey Dresser, MD;  Location: [place];  Service: Cardiovascular;  Laterality: N/A;   RIGID BRONCHOSCOPY Left 10/09/2017   Procedure: RIGID BRONCHOSCOPY;  Surgeon: Jodi Marble, MD;  Location: [facility];  Service: ENT;  Laterality: Left;   TEE WITHOUT CARDIOVERSION N/A 06/07/2020   Procedure: TRANSESOPHAGEAL ECHOCARDIOGRAM (TEE);  Surgeon: Sherren Mocha, MD;  Location: [facility];  Service: Open Heart Surgery;  Laterality: N/A;   TONSILLECTOMY     TOTAL HIP ARTHROPLASTY Left 12/04/2020   Procedure: TOTAL HIP ARTHROPLASTY ANTERIOR APPROACH;  Surgeon: Rod Can, MD;  Location: [facility];  Service: Orthopedics;  Laterality: Left;   TOTAL HIP ARTHROPLASTY Left 11/2020   TRACHEOSTOMY  12/02/2017   Dr. Nicolette Bang- [facility]   ULTRASOUND GUIDANCE FOR VASCULAR ACCESS Right 06/07/2020   Procedure: ULTRASOUND GUIDANCE FOR VASCULAR ACCESS;  Surgeon: Sherren Mocha, MD;  Location: [facility];  Service: Open Heart Surgery;  Laterality: Right;   ULTRASOUND GUIDANCE FOR VASCULAR ACCESS Bilateral 03/03/2021   Procedure: ULTRASOUND GUIDANCE FOR VASCULAR ACCESS, BILATERAL FEMORAL ARTERIES;  Surgeon: Waynetta Sandy, MD;  Location: [facility];  Service: Vascular;  Laterality: Bilateral;   VASCULAR SURGERY     Family History  Problem Relation Age of Onset   Heart disease Father 92       Living  Coronary artery disease Father        CABG   Alzheimer's disease Mother 64       Deceased   Arthritis Mother    Aneurysm Brother    Stomach cancer Maternal Uncle    Brain cancer Maternal Aunt        x2   Obesity Daughter        Had Bypass Sx   Allergies  Allergen Reactions   Celebrex [Celecoxib] Hives and Itching   Tape Other (See Comments)    Tears skin. Prefers paper tape Please   Objective:   Today's Vitals   09/01/21 1132  BP: 114/60  Pulse: 90  Temp: 98 F (36.7 C)  TempSrc: Temporal  SpO2: 97%  Weight: 168 lb (76.2 kg)  Height: 5' 6.5" (1.689 m)   Body  mass index is 26.71 kg/m.   General: Well developed, well nourished. No acute distress. Lungs: Clear to auscultation bilaterally. No wheezing, rales or rhonchi. Psych: Alert and oriented. Normal mood and affect.  Health Maintenance Due  Topic Date Due   Zoster Vaccines- Shingrix (1 of 2) Never done   COVID-19 Vaccine (4 - Booster for Pfizer series) 01/11/2020   COLONOSCOPY (Pts 45-76yr Insurance coverage will need to be confirmed)  03/22/2020   Lab Results    Latest Ref Rng & Units 08/21/2021   12:58 AM 08/20/2021    9:31 AM 08/19/2021   12:26 AM  CBC  WBC 4.0 - 10.5 K/uL 6.1  6.5  5.6   Hemoglobin 13.0 - 17.0 g/dL 8.0  8.1  8.1   Hematocrit 39.0 - 52.0 % 25.2  25.4  25.2   Platelets 150 - 400 K/uL 99  94  75       Latest Ref Rng & Units 08/21/2021   12:58 AM 08/20/2021    9:31 AM 08/19/2021   12:26 AM  CMP  Glucose 70 - 99 mg/dL 147  156  192   BUN 8 - 23 mg/dL 44  41  36   Creatinine 0.61 - 1.24 mg/dL 1.05  0.92  0.86   Sodium 135 - 145 mmol/L 137  138  137   Potassium 3.5 - 5.1 mmol/L 5.3  4.4  4.6   Chloride 98 - 111 mmol/L 103  111  110   CO2 22 - 32 mmol/L '25  22  20   '$ Calcium 8.9 - 10.3 mg/dL 8.8  8.7  8.4   Total Protein 6.5 - 8.1 g/dL 5.1  5.2  5.0   Total Bilirubin 0.3 - 1.2 mg/dL 0.6  0.5  0.6   Alkaline Phos 38 - 126 U/L 79  82  89   AST 15 - 41 U/L '15  14  21   '$ ALT 0 - 44 U/L '17  16  16    '$ Imaging: CT Angio of Chest/Abdomen/Pelvis (08/17/2021) IMPRESSION: 1. No evidence of thoracoabdominal aortic dissection. 2. Extensive coronary artery calcification. Status post transcatheter aortic valve replacement. 3. Multifocal pulmonary infiltrate, most severe within the superior segment of the right lower lobe, in keeping with acute infection in the appropriate clinical setting. Associated bronchial wall thickening in keeping with airway inflammation. No central obstructing mass identified. 4. Stable changes of pulmonary arterial hypertension. 5. Stable infrarenal  abdominal aortic aneurysm status post endovascular repair. Stable aneurysm sac size at 5.4 cm. 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 6. Bilateral renal artery hemodynamically significant stenoses, involving the main right renal artery and both the main as well as accessory  lower polar left renal arteries. 7. Moderate distal colonic diverticulosis without superimposed acute inflammatory change. 8. Postsurgical changes of L4-5 hemilaminectomies and lumbar discectomy with interbody bone cage placement. No definite bridging callus identified at L4-5.    Assessment & Plan:   1. Community acquired pneumonia, unspecified laterality Lungs are clear to auscultation today. I will order follow up labs as requested by discharging physician. Plan repeat CXR at his next visit.  - CBC - Comprehensive metabolic panel - Magnesium - Phosphorus  2. Essential hypertension Blood pressure has a low diastolic. It appears Mr. Waymire is holding his amlodipine for now, which would appear appropriate. He will follow up with cardiology next week.   3. Chronic diastolic heart failure (HCC) Weight is up 4 lbs since early June, but no sign of significant exacerbation of his HF. He is scheduled with cardiology for next week.  4. Chronic obstructive pulmonary disease, unspecified COPD type ([place]) O2 sat good on room air today. Continue inhalers. Patient denies any current tobacco use.  5. AKI (acute kidney injury) ([place]) We will reassess.  - Comprehensive metabolic panel - Magnesium - Phosphorus   Return for As scheduled.   Haydee Salter, MD

## 2021-09-05 ENCOUNTER — Other Ambulatory Visit: Payer: Self-pay | Admitting: Family Medicine

## 2021-09-05 ENCOUNTER — Telehealth: Payer: Self-pay | Admitting: Family Medicine

## 2021-09-05 DIAGNOSIS — J449 Chronic obstructive pulmonary disease, unspecified: Secondary | ICD-10-CM

## 2021-09-05 NOTE — Telephone Encounter (Signed)
Please review and advise. Thanks. Dm/cma  

## 2021-09-05 NOTE — Telephone Encounter (Signed)
Pt is wanting a refill on Incruse Ellipta, it has a once a day application. He is aware he has been prescribed albuterol (VENTOLIN HFA) 108 (90 Base) MCG/ACT inhaler [[id number]]. He wants to take these two in combination.  [facility] [id number] [place]  [place], [place] [address]  Phone:  [phone]  Fax:  [phone]  DEA #:  [id number]  Pt '@[phone]'$ [phone]

## 2021-09-08 ENCOUNTER — Ambulatory Visit (HOSPITAL_COMMUNITY)
Admission: RE | Admit: 2021-09-08 | Discharge: 2021-09-08 | Disposition: A | Discharge status: Home / Self Care | Payer: Medicare Other | Source: Ambulatory Visit | Attending: Cardiology | Admitting: Cardiology

## 2021-09-08 ENCOUNTER — Encounter (HOSPITAL_COMMUNITY): Payer: Self-pay | Admitting: Cardiology

## 2021-09-08 VITALS — BP 100/60 | HR 64 | Wt 169.4 lb

## 2021-09-08 DIAGNOSIS — E785 Hyperlipidemia, unspecified: Secondary | ICD-10-CM | POA: Insufficient documentation

## 2021-09-08 DIAGNOSIS — Z7952 Long term (current) use of systemic steroids: Secondary | ICD-10-CM | POA: Diagnosis not present

## 2021-09-08 DIAGNOSIS — Z8679 Personal history of other diseases of the circulatory system: Secondary | ICD-10-CM | POA: Insufficient documentation

## 2021-09-08 DIAGNOSIS — Z87891 Personal history of nicotine dependence: Secondary | ICD-10-CM | POA: Diagnosis not present

## 2021-09-08 DIAGNOSIS — I110 Hypertensive heart disease with heart failure: Secondary | ICD-10-CM | POA: Diagnosis not present

## 2021-09-08 DIAGNOSIS — I5032 Chronic diastolic (congestive) heart failure: Secondary | ICD-10-CM | POA: Insufficient documentation

## 2021-09-08 DIAGNOSIS — Z955 Presence of coronary angioplasty implant and graft: Secondary | ICD-10-CM | POA: Diagnosis not present

## 2021-09-08 DIAGNOSIS — M353 Polymyalgia rheumatica: Secondary | ICD-10-CM | POA: Diagnosis not present

## 2021-09-08 DIAGNOSIS — Z8249 Family history of ischemic heart disease and other diseases of the circulatory system: Secondary | ICD-10-CM | POA: Insufficient documentation

## 2021-09-08 DIAGNOSIS — I2510 Atherosclerotic heart disease of native coronary artery without angina pectoris: Secondary | ICD-10-CM | POA: Diagnosis not present

## 2021-09-08 DIAGNOSIS — D509 Iron deficiency anemia, unspecified: Secondary | ICD-10-CM | POA: Diagnosis not present

## 2021-09-08 DIAGNOSIS — I350 Nonrheumatic aortic (valve) stenosis: Secondary | ICD-10-CM | POA: Diagnosis not present

## 2021-09-08 DIAGNOSIS — Z952 Presence of prosthetic heart valve: Secondary | ICD-10-CM | POA: Diagnosis not present

## 2021-09-08 DIAGNOSIS — Z79899 Other long term (current) drug therapy: Secondary | ICD-10-CM | POA: Diagnosis not present

## 2021-09-08 LAB — LIPID PANEL
Cholesterol: 142 mg/dL (ref 0–200)
HDL: 50 mg/dL (ref >40)
LDL Cholesterol: 71 mg/dL (ref 0–99)
Total CHOL/HDL Ratio: 2.8 RATIO
Triglycerides: 104 mg/dL (ref <150)
VLDL: 21 mg/dL (ref 0–40)

## 2021-09-08 NOTE — Patient Instructions (Addendum)
Good to see you today!  Labs done today, your results will be available in MyChart, we will contact you for abnormal readings.  You have been referred to GI doctor,they will call to schedule an appointment  Your physician recommends that you schedule a follow-up appointment in: 6 months ( February 2024)  ** please call the office in November to arrange your follow up appointment **  If you have any questions or concerns before your next appointment please send [place] a message through [place] or call our office at [phone].    TO LEAVE A MESSAGE FOR THE NURSE SELECT OPTION 2, PLEASE LEAVE A MESSAGE INCLUDING: YOUR NAME DATE OF BIRTH CALL BACK NUMBER REASON FOR CALL**this is important as we prioritize the call backs  YOU WILL RECEIVE A CALL BACK THE SAME DAY AS LONG AS YOU CALL BEFORE 4:00 PM  At the [facility], you and your health needs are our priority. As part of our continuing mission to provide you with exceptional heart care, we have created designated Provider Care Teams. These Care Teams include your primary Cardiologist (physician) and Advanced Practice Providers (APPs- Physician Assistants and Nurse Practitioners) who all work together to provide you with the care you need, when you need it.   You may see any of the following providers on your designated Care Team at your next follow up: Dr Glori Bickers Dr Haynes Kerns, NP Lyda Jester, [place] [facility] [place], [place] Audry Riles, PharmD   Please be sure to bring in all your medications bottles to every appointment.

## 2021-09-08 NOTE — Progress Notes (Signed)
Patient ID: COBI DELPH, male   DOB: Jun 11, 1940, 81 y.o.   MRN: [id number] PCP: Elyn Aquas Cardiology: Dr. Aundra Dubin  81 y.o.with history of CAD, HTN, and hyperlipidemia presents for followup.  Patient had described some exertional chest pain and myoview showed a partially reversible inferior defect, so LHC was done in 7/10.  This showed that his proximal RCA was totally occluded.  There were very robust left to right collaterals.  There was also a 50% proximal LAD stenosis.  The patient was managed medically.    Most recent echo in 5/20 showed EF 65-70% with moderate AS and mild AI.     Earlier in 2018, he developed a Strep intermedius liver abscess.  He had a hepatic drain placed and also had a right pleural effusion requiring diuresis.    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  He is now cancer-free.    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 Abdominal US in 2/22 showed stable 4.8 cm AAA.   Echo in 2/22 showed EF 60-65% with mild LVH, normal RV, PASP 45 mmHg, severe AS with mean gradient 50 mmHg and AVA 0.96 cm^2. RHC/LHC was done in preparation for AVR, this showed 95% pLAD stenosis, 60-70% pLCx, and CTO RCA with collaterals.  FFR was negative for LCx, patient had DES to LAD.   Patient had TAVR in 5/22 with 29 mm Edwards Sapien THV.  Echo in 7/22 showed hyperdynamic LV with EF 70-75%, normal RV, bioprosthetic aortic valve with mild AI and higher mean gradient 16 mmHg.  Echo in 10/22 showed EF 60-65%, moderate asymmetric basal septal hypertrophy, normal RV, normal IVC, bioprosthetic AoV s/p TAVR with mean gradient 16 and trivial PVL.   In 10/22, patient had mechanical fall (tripped) and fractured his left femoral neck.  He had left THR in 10/22.     He underwent an abdominal aorta endovascular stent graft on 03/03/2021. He notes that it was discovered afterwards that he did not have good flow through the left iliac. 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   Admitted 05/23 with AKI any myoclonic jerking. Scr peaked at 7.35, improved to 1.13 at discharge. Etiology felt to be hypovolemia d/t recent N, V, D and possible urinary obstruction. Neurologic workup unrevealing. Had anemia requiring 1 u RBCs. Hgb 9.5 at discharge.   Readmitted 07/23 with severe sepsis/CAP and AKI. Developed CP after choking on pills. Some concern for possible aspiration d/t prior XRT and throat surgeries for cancer. HS troponin 276 > 45 in setting of PNA. Limited echo with preserved EF. Cardiology consulted. No further ischemic workup recommended. Course c/b acute on chronic anemia. Hgb as low as 7.5. + FOBT. Iron stores low. Outpatient GI f/u recommended.   Here today for f/u. He denies CP, dyspnea, orthopnea or PND. Back pain limits his mobility. Does enjoy working on projects in his [place]. Taking medications as prescribed. Reports SBP 120s-130s in am but up to 150s-160s in pm. Occasionally gets dizzy earlier in day. Denies any recent melena or BRBPR.   Labs (12/10): HDL 28, LDL 70, K 4.3, creatinine 0.8 Labs (4/11): K 4, creatinine 0.8, LDL 53, HDL 26, LFTs normal Labs (12/11): LDL 74, HDL 27, K 4, creatinine 0.9, LFTs normal, TSH normal Labs (7/12): LDL 87, HDL 33 Labs (9/12): LDL 66, HDL 33, LFTs normal Labs (9/13): LDL 62, HDL 30, K 4.1,  creatinine 0.8 Labs (4/14): LDL 88, HDL 27, K 4.4, creatinine 0.8 Labs (4/15): K 4.4, creatinine 0.76 Labs (4/16): LDL 69, HDL 32 Labs (11/16): K 4.3, creatinine 0.76, LDL 68, HDL 31 Labs (1/18): LDL 72 Labs (6/18): K 4.2, creatinine 0.6, LFTs normal, hgb 8.1.  Labs (9/18): K 4, creatinine 0.6, LDL 79, HDL 36 Labs (1/19): K 3.9, creatinine 0.67 Labs (8/21): K 3.7, creatinine 0.78 Labs (2/22): LDL 98 Labs (3/22): K 3.2, creatinine 0.65 Labs (6/22): LDL 77 Labs (7/22): K 3.7,creatinine 0.68 Labs (8/22): K 3.6, creatinine 0.77, hgb  10.7 Labs (11/22): K 3.7, creatinine 0.6, hgb 8.3 Labs (1/23): K 3.9, creatinine 0.67 Labs (7/23): K 4.3, creatinine 0.73, hgb 10.2  Allergies (verified):  1)  ! Celebrex  Past Medical History: 1. Hyperlipidemia: Myalgias with atorvastatin and Crestor. 2. Aortic stenosis.  Echocardiogram in August 2009 showed EF 60%, no regional wall motion abnormalities, mild LVH, moderate focal basal septal hypertrophy, and mild diastolic dysfunction.  There was a very mild aortic stenosis with partially fused left and right coronary cusps and some restricted motion of the aortic valve.  The mean gradient, however, across the aortic valve was only 8 mmHg.  There was also mild left atrial enlargement and normal RV size and function.  Minimal AS on LHC in 7/10.  Echo (7/14) with EF 60-65%, mild LVH, mild AS mean gradient 13 mmHg.  Echo (6/16) with EF 55-60%, mild AS with mean gradient 14 mmHg.  - Echo (6/18): EF 55-60%, mild LVH, moderate AS mean gradient 27 mmHg, mild AI.   - Echo (5/20, [facility]): EF 65-70%, moderate AS, mild AI.  - Echo (2/22): EF 60-65% with mild LVH, normal RV, PASP 45 mmHg, severe AS with mean gradient 50 mmHg and AVA 0.96 cm^2.  - TAVR with 29 mm Edwards Sapien THV in 5/22.  - Echo (7/22): Hyperdynamic LV with EF 70-75%, normal RV, bioprosthetic aortic valve with mild AI and higher mean gradient 16 mmHg. - Echo (10/22): EF 60-65%, moderate asymmetric basal septal hypertrophy, normal RV, normal IVC, bioprosthetic AoV s/p TAVR with mean gradient 16 and trivial PVL. - Echo (06/23): EF 65-70%, RV okay, mean gradient of 8 mmHg across aortic valve prosthesis, mild perivalvular leak - Limited echo (07/23): EF 65-70% 3. Hypertension. 4. Gastroesophageal reflux disease: h/o esophageal stricture.  5. BPH. 6. Diverticulosis. 7. Smoked until 7/10. 8. CAD: LHC (7/10) showed totally occluded proximal RCA with very robust left to right collaterals, 50% proximal LAD stenosis, EF 65%.   - LHC (2/22): 95%  stenosis proximal LAD, 60-70% proximal LCx, totally occluded RCA with collaterals.  Negative FFR of LCx, patient had DES to LAD.   9. Osteoarthritis 10. Colonic polyps 11. h/o TIA 12. 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  This manifested with slurred speech. 13. History of low back pain.  The patient is status post surgical back fusion. 14. History of cholecystectomy.  15. Cataract surgery 16. Prostate cancer: treated 84. Liver abscess: Strep intermedius, 2018.  18. AAA: Noted in 8/18 on CT abdomen, 4.3 cm.  - Abdominal US (10/20): 4.8 cm AAA.  - Abdominal US (2/22): 4.8 cm AAA - CT abdomen 3/22 with 5 cm AAA - CT abdomen (10/22): 5.2 cm AAA - s/p AAA repair 1/23 with stent graft 19. Carotid stenosis: carotid dopplers 10/18 with 40-59% RICA stenosis.  - Carotid dopplers (10/20): 50-69% RICA stenosis.   - Carotid dopplers (2/22): Mild BICA stenosis.  20. Tonsillar squamous cell carcinoma: 2019,  treated with resection and radiation.  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  22. Gout 23. Mechanical fall with left femoral neck fracture and left THR in 10/22.  24. GI bleeding with Fe deficiency anemia.   Family History: Father alive w/ CAD & [facility] in [facility], had TAVR as well.  Mother died age 65 w/ Alzheimer's disease, hx of DJD 1 Sibling- Brother alive & well w/ DJD in his hips  Social History: Married- 39rd marriage, wife= Etta 2 children from 1st marriage, 4 step children Former smoker, 40+yrs of >1ppd, quit 7/10. Restarted, then quit again in 4/22.  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   Current Outpatient Medications  Medication Sig Dispense Refill   acetaminophen (TYLENOL) 500 MG tablet Take 1,000 mg by mouth every 8 (eight) hours as needed for mild pain.     albuterol (VENTOLIN HFA)  108 (90 Base) MCG/ACT inhaler INHALE 2 PUFFS INTO THE LUNGS EVERY 6 HOURS AS NEEDED FOR WHEEZING OR SHORTNESS OF BREATH 6.7 g 0   allopurinol (ZYLOPRIM) 100 MG tablet TAKE 2 TABLETS(200 MG) BY MOUTH DAILY 180 tablet 0   amLODipine (NORVASC) 10 MG tablet TAKE 1 TABLET(10 MG) BY MOUTH DAILY 90 tablet 3   aspirin EC 81 MG tablet Take 81 mg by mouth daily. Swallow whole.     atorvastatin (LIPITOR) 80 MG tablet Take 1 tablet (80 mg total) by mouth daily. 90 tablet 3   carvedilol (COREG) 12.5 MG tablet Take 1 tablet (12.5 mg total) by mouth 2 (two) times daily. 60 tablet 6   Cholecalciferol 25 MCG (1000 UT) tablet Take 1,000 Units by mouth daily.     citalopram (CELEXA) 20 MG tablet TAKE 1 TABLET(20 MG) BY MOUTH DAILY 90 tablet 1   ezetimibe (ZETIA) 10 MG tablet Take 1 tablet (10 mg total) by mouth daily. 30 tablet 5   fluticasone (FLONASE) 50 MCG/ACT nasal spray SHAKE LIQUID AND USE 2 SPRAYS IN EACH NOSTRIL DAILY AS NEEDED FOR RHINITIS OR ALLERGIES 16 g 11   furosemide (LASIX) 20 MG tablet Take 20 mg by mouth daily.     gabapentin (NEURONTIN) 300 MG capsule Take 300 mg by mouth 2 (two) times daily.     ipratropium (ATROVENT) 0.02 % nebulizer solution Use 1 vial (0.5 mg total) by nebulization every 6 (six) hours as needed for wheezing or shortness of breath. 75 mL 12   ipratropium (ATROVENT) 0.03 % nasal spray Place 2 sprays into both nostrils every 12 (twelve) hours. 30 mL 12   levalbuterol (XOPENEX) 0.63 MG/3ML nebulizer solution Use 1 vial (0.63 mg total) by nebulization every 6 (six) hours as needed for wheezing or shortness of breath. 75 mL 12   lisinopril (ZESTRIL) 20 MG tablet Take 20 mg by mouth daily.     Oxycodone HCl 10 MG TABS Take 1 tablet (10 mg total) by mouth every 4 (four) hours as needed (max 5 tabs/day). 150 tablet 0   pantoprazole (PROTONIX) 40 MG tablet Take 1 tablet (40 mg total) by mouth daily. 30 tablet 2   potassium chloride (KLOR-CON) 10 MEQ tablet Take 2 tablets (20 mEq total)  by mouth 2 (two) times daily. 120 tablet 11   predniSONE (DELTASONE) 5 MG tablet Take 7 mg by mouth daily with breakfast. Has 1 mg tablets to get 7 mg total     tamsulosin (  FLOMAX) 0.4 MG CAPS capsule Take 0.4 mg by mouth in the morning and at bedtime.     thiamine 100 MG tablet Take 1 tablet (100 mg total) by mouth daily. 30 tablet 2   Tiotropium Bromide Monohydrate (SPIRIVA RESPIMAT) 2.5 MCG/ACT AERS Inhale 2 puffs into the lungs daily. 4 g 6   traMADol (ULTRAM) 50 MG tablet Take 2 tablets (100 mg total) by mouth 2 (two) times daily. 120 tablet 5   trolamine salicylate (ASPERCREME) 10 % cream Apply 1 Application topically 2 (two) times daily as needed (arthritis pain).     vitamin B-12 (CYANOCOBALAMIN) 1000 MCG tablet Take 1 tablet (1,000 mcg total) by mouth daily. 30 tablet 2   No current facility-administered medications for this encounter.   Wt Readings from Last 3 Encounters:  09/08/21 76.8 kg (169 lb 6.4 oz)  09/01/21 76.2 kg (168 lb)  08/21/21 79.5 kg (175 lb 4.3 oz)   BP 100/60   Pulse 64   Wt 76.8 kg (169 lb 6.4 oz)   SpO2 95%   BMI 26.93 kg/m  Physical Exam: General:  Elderly male. No distress. Ambulated into clinic. HEENT: normal Neck: supple. no JVD. Carotids 2+ bilat; no bruits.  Cor: PMI nondisplaced. Regular rate & rhythm. No rubs, gallops or murmurs. Lungs: diminished Abdomen: soft, nontender, nondistended.  Extremities: no cyanosis, clubbing, rash, edema Neuro: alert & orientedx3, cranial nerves grossly intact. moves all 4 extremities w/o difficulty. Affect pleasant   Assessment/Plan: 1. Aortic stenosis: Patient had TAVR 06/07/20 by left subclavian access given presence of AAA with 29 mm Edwards Sapien THV.  Echo in 10/22 with stable mean gradient 16 mmHg and trivial PVL.  - Echo 06/23: EF 65-70%, RV okay, mean gradient of 8 mmHg across aortic valve prosthesis, mild perivalvular leak - Valve appears to function normally.  2. CAD: LHC in 2/22 with CTO RCA with  collaterals, 95% pLAD treated with DES, 60-70% proximal LCx (FFR negative). No further exertional chest pain since PCI.  - Troponin elevation during recent admit likely d/t demand ischemia. No recent change. No further workup indicated. - Continue atorvastatin, check lipid panel today - Continue ASA 81. 3. Hyperlipidemia:  Check lipids as above 4. HTN: Soft today. At home, BP controlled in am but elevated in evening. Intermittent dizziness with lower readings. Will not change regimen d/t concern for overtreating BP - Continue amlodipine 10 mg daily.  - Continue Coreg 12.5 mg bid.  - Continue lisinopril 20 mg daily.   5. Abdominal aortic aneurysm: 5.2 cm by CT in 10/22.   - s/p AAA repair with Dr. Donzetta Matters 1/23. - No evidence of endoleak on [place] 04/23 6. Carotid stenosis: Mild by 2/22 dopplers.  7. Smoking: He has quit.   8. Chronic diastolic CHF: He is not volume overloaded on exam.   - Continue Lasix 20 mg daily, CMET today.    9. Polymyalgia rheumatica: Continues with joint pain.  He is on prednisone.  Followup: 6 months  Marlyce Huge N FNP 09/08/2021  Patient seen with NP, agree with the above note.   He has recovered from recent admissions with AKI and then PNA.  Creatinine back down to 0.73. No chest pain or significant exertional dyspnea.   General: NAD Neck: No JVD, no thyromegaly or thyroid nodule.  Lungs: Clear to auscultation bilaterally with normal respiratory effort. CV: Nondisplaced PMI.  Heart regular S1/S2, no S3/S4, 1/6 SEM RUSB.  No peripheral edema.  No carotid bruit.  Normal pedal pulses.  Abdomen: Soft, nontender, no hepatosplenomegaly, no distention.  Skin: Intact without lesions or rashes.  Neurologic: Alert and oriented x 3.  Psych: Normal affect. Extremities: No clubbing or cyanosis.  HEENT: Normal.   Stable today, recent creatinine back to normal range.  Echo in 6/23 showed well-seated TAVR valve and normal LV EF.  Volume status looks ok.    Check lipids  today. No medication changes.   Followup in 6 months.   Loralie Champagne 09/09/2021

## 2021-09-15 ENCOUNTER — Ambulatory Visit: Payer: Medicare Other | Admitting: Physical Medicine and Rehabilitation

## 2021-09-16 ENCOUNTER — Other Ambulatory Visit: Payer: Self-pay | Admitting: Rheumatology

## 2021-09-18 NOTE — Telephone Encounter (Signed)
Ok to increase back to prednisone 7 mg daily.  We can further discuss tapering at his upcoming follow up visit.

## 2021-09-18 NOTE — Telephone Encounter (Signed)
Attempted to contact the patient and left message for patient to call the office.  

## 2021-09-18 NOTE — Telephone Encounter (Signed)
Next Visit: 10/12/2021   Last Visit: 07/06/2021   Last Fill: 08/28/2021  DX:  Polymyalgia rheumatica    Current Dose per office note 07/06/2021: prednisone by 1 mg every 2 months.  He will be reducing the dose to prednisone 8 mg p.o. daily now.  Patient states he is down to 6 mg of Prednisone. He states he started that at the first of the month. Patient states he is starting having pain in his bilateral hands and left shoulder. Concerned if 6 mg is going to work for him.   Okay to refill Prednisone?

## 2021-09-22 ENCOUNTER — Encounter: Payer: Medicare Other | Admitting: Adult Health

## 2021-09-25 DIAGNOSIS — E785 Hyperlipidemia, unspecified: Secondary | ICD-10-CM | POA: Diagnosis not present

## 2021-09-25 DIAGNOSIS — N179 Acute kidney failure, unspecified: Secondary | ICD-10-CM | POA: Diagnosis not present

## 2021-09-25 DIAGNOSIS — I959 Hypotension, unspecified: Secondary | ICD-10-CM | POA: Diagnosis not present

## 2021-09-25 DIAGNOSIS — E875 Hyperkalemia: Secondary | ICD-10-CM | POA: Diagnosis not present

## 2021-09-25 DIAGNOSIS — I5032 Chronic diastolic (congestive) heart failure: Secondary | ICD-10-CM | POA: Diagnosis not present

## 2021-09-25 DIAGNOSIS — M545 Low back pain, unspecified: Secondary | ICD-10-CM | POA: Diagnosis not present

## 2021-09-25 DIAGNOSIS — D696 Thrombocytopenia, unspecified: Secondary | ICD-10-CM | POA: Diagnosis not present

## 2021-09-25 DIAGNOSIS — R6520 Severe sepsis without septic shock: Secondary | ICD-10-CM | POA: Diagnosis not present

## 2021-09-25 DIAGNOSIS — J9601 Acute respiratory failure with hypoxia: Secondary | ICD-10-CM | POA: Diagnosis not present

## 2021-09-25 DIAGNOSIS — G8929 Other chronic pain: Secondary | ICD-10-CM | POA: Diagnosis not present

## 2021-09-25 DIAGNOSIS — I700 Atherosclerosis of aorta: Secondary | ICD-10-CM | POA: Diagnosis not present

## 2021-09-25 DIAGNOSIS — J189 Pneumonia, unspecified organism: Secondary | ICD-10-CM | POA: Diagnosis not present

## 2021-09-25 DIAGNOSIS — A419 Sepsis, unspecified organism: Secondary | ICD-10-CM | POA: Diagnosis not present

## 2021-09-25 DIAGNOSIS — I714 Abdominal aortic aneurysm, without rupture, unspecified: Secondary | ICD-10-CM | POA: Diagnosis not present

## 2021-09-25 DIAGNOSIS — I110 Hypertensive heart disease with heart failure: Secondary | ICD-10-CM | POA: Diagnosis not present

## 2021-09-27 ENCOUNTER — Other Ambulatory Visit: Payer: Self-pay | Admitting: Family Medicine

## 2021-09-27 DIAGNOSIS — L308 Other specified dermatitis: Secondary | ICD-10-CM | POA: Diagnosis not present

## 2021-09-27 DIAGNOSIS — J449 Chronic obstructive pulmonary disease, unspecified: Secondary | ICD-10-CM

## 2021-09-27 DIAGNOSIS — X32XXXD Exposure to sunlight, subsequent encounter: Secondary | ICD-10-CM | POA: Diagnosis not present

## 2021-09-27 DIAGNOSIS — L570 Actinic keratosis: Secondary | ICD-10-CM | POA: Diagnosis not present

## 2021-09-29 NOTE — Progress Notes (Signed)
Office Visit Note  Patient: Brandon [name]             Date of Birth: May 17, 1940           MRN: [id number]             PCP: Haydee Salter, MD Referring: Haydee Salter, MD Visit Date: 10/12/2021 Occupation: '@GUAROCC' @  Subjective:  Low back pain   History of Present Illness: Brandon [name] is a 81 y.o. male with history of polymyalgia rheumatica, gout, osteoarthritis, DDD.  Patient is currently on prednisone 5 mg daily.  He has been reducing prednisone by 1 mg at the start of every month.  Patient reports that he started to notice increased pain and stiffness in both hands when reducing prednisone to 6 mg daily.  He denies any signs or symptoms of a polymyalgia rheumatica flare.  He denies any joint swelling at this time.  He has been experiencing severe discomfort in his lower back.  He has been under the care of Dr. Herma Mering and has had 2 injections in the past.  He had an MRI last week and will be following up next week to discuss results in the next steps in treatment.  He continues to follow-up closely with Dr. Alice Rieger his pain management specialist.  At his recent office visit on 10/06/2021 the dose of oxycodone was increased to 10 mg every 6 hours for pain relief.  He has had difficulty ambulating due to severity of pain in his lower back and has had to take breaks if walking prolonged distances.  He has been using a cane to assist with ambulation.  He has been taking Fosamax 70 mg 1 tablet by mouth once weekly for management of osteopenia.  He has been on Fosamax since December 2022. He denies any signs or symptoms of a gout flare.  He remains on allopurinol 200 mg daily. He denies any recent or recurrent infections.   Activities of Daily Living:  Patient reports morning stiffness for all day. Patient Reports nocturnal pain.  Difficulty dressing/grooming: Denies Difficulty climbing stairs: Reports Difficulty getting out of chair: Denies Difficulty using hands for taps, buttons, cutlery,  and/or writing: Reports  Review of Systems  Constitutional:  Negative for fatigue and night sweats.  HENT:  Positive for mouth dryness. Negative for mouth sores and nose dryness.   Eyes:  Negative for redness and dryness.  Respiratory:  Negative for shortness of breath and difficulty breathing.   Cardiovascular:  Negative for chest pain, palpitations, hypertension, irregular heartbeat and swelling in legs/feet.  Gastrointestinal:  Positive for diarrhea. Negative for blood in stool and constipation.  Endocrine: Negative for increased urination.  Genitourinary:  Negative for painful urination and involuntary urination.  Musculoskeletal:  Positive for joint pain, gait problem, joint pain, joint swelling, myalgias, muscle weakness, morning stiffness, muscle tenderness and myalgias.  Skin:  Negative for color change, rash, hair loss, nodules/bumps, skin tightness, ulcers and sensitivity to sunlight.  Allergic/Immunologic: Negative for susceptible to infections.  Neurological:  Positive for headaches. Negative for dizziness, fainting, memory loss, night sweats and weakness.  Hematological:  Negative for swollen glands.  Psychiatric/Behavioral:  Negative for depressed mood and sleep disturbance. The patient is not nervous/anxious.     PMFS History:  Patient Active Problem List   Diagnosis Date Noted  . Hypotension 08/18/2021  . AKI (acute kidney injury) ([place]) 08/17/2021  . Hyponatremia 06/26/2021  . Hyperkalemia 06/26/2021  . Acute metabolic encephalopathy 76/16/0737  . Myoclonic  jerking 06/26/2021  . Lumbar postlaminectomy syndrome 05/03/2021  . Degeneration of lumbar intervertebral disc 05/03/2021  . Lumbar spondylosis 05/03/2021  . History of hip fracture 04/24/2021  . S/P abdominal aortic aneurysm repair 04/24/2021  . Chronic allergic rhinitis 01/24/2021  . Demand ischemia ([place])   . Acute respiratory failure with hypoxia ([place])   . Elevated troponin   . Chronic diastolic heart  failure ([place]) 12/03/2020  . Steatosis of liver 11/01/2020  . 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  . Hepatitis B core antibody positive 11/01/2020  . Benign prostatic hyperplasia 11/01/2020  . Chronic obstructive pulmonary disease ([place]) 07/20/2020  . History of placement of stent in LAD coronary artery 07/20/2020  . 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 TIA (transient ischemic attack)   . Esophageal stricture   . 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  . 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  . Primary osteoarthritis of both hands 01/11/2020  . Piriformis syndrome of both sides 09/23/2019  . Pain of both shoulder joints 07/29/2019  . Sciatica associated with disorder of lumbar spine 04/24/2019  . Primary osteoarthritis involving multiple joints 04/24/2019  . Chronic pain syndrome 04/24/2019  . Depression 04/15/2018  . Cancer of tonsillar fossa ([place]) 09/20/2017  . 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 Acute on chronic anemia 07/10/2016  . Essential hypertension 05/19/2013  . Osteoarthritis 05/29/2009  . Psoriasis 01/13/2009  . Coronary artery disease 09/15/2008  . Diverticulosis of colon 08/27/2007  . History of benign prostatic hyperplasia 08/27/2007  . Hyperlipidemia 03/19/2007  . Gastroesophageal reflux disease 03/19/2007    Past Medical History:  Diagnosis Date  . Abdominal aneurysm ([place])   . Arthritis    "all over" (07/19/2016)  . BPH (benign prostatic hypertrophy)   . CAD (coronary artery disease)   . Chicken pox   . Chronic lower back pain    s/p surgical fusion  . Depression   . Diverticulosis   . Esophageal stricture   . GERD (gastroesophageal reflux disease)   . Hepatitis B 1984  . Hiatal hernia   . History of radiation therapy 01/16/18- 03/05/18   Left Tonsil, 66 Gy in 33 fractions to high risk nodal echelons.   Marland Kitchen HLD (hyperlipidemia)   . HTN (hypertension)   . 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 Osteoarthritis   . S/P TAVR (transcatheter  aortic valve replacement) 06/07/2020   s/p TAVR with a 29 mm Edwards Sapien 3 via the subclavian approach by Dr. Burt Knack and Dr Cyndia Bent   . Severe aortic stenosis   . TIA (transient ischemic attack) 1990s   hx  . tonsillar ca dx'd 11/2017  . Tubular adenoma of colon 2009    Family History  Problem Relation Age of Onset  . Heart disease Father 48       Living  . Coronary artery disease Father        CABG  . Alzheimer's disease Mother 69       Deceased  . Arthritis Mother   . Aneurysm Brother   . Stomach cancer Maternal Uncle   . Brain cancer Maternal Aunt        x2  . Obesity Daughter        Had Bypass Sx   Past Surgical History:  Procedure Laterality Date  . ABDOMINAL AORTIC ENDOVASCULAR STENT GRAFT N/A 03/03/2021   Procedure: ABDOMINAL AORTIC ENDOVASCULAR STENT GRAFT;  Surgeon: Waynetta Sandy, MD;  Location: [facility];  Service: Vascular;  Laterality: N/A;  . ABDOMINAL AORTOGRAM W/LOWER EXTREMITY N/A 04/10/2021  Procedure: ABDOMINAL AORTOGRAM W/LOWER EXTREMITY;  Surgeon: Waynetta Sandy, MD;  Location: [facility];  Service: Cardiovascular;  Laterality: N/A;  . BACK SURGERY    . CARDIAC CATHETERIZATION    . CATARACT EXTRACTION W/ INTRAOCULAR LENS  IMPLANT, BILATERAL Bilateral 01/2012 - 02/2012  . CORONARY ATHERECTOMY N/A 04/27/2020   Procedure: CORONARY ATHERECTOMY;  Surgeon: Sherren Mocha, MD;  Location: [facility];  Service: Cardiovascular;  Laterality: N/A;  . CORONARY STENT INTERVENTION N/A 04/27/2020   Procedure: CORONARY STENT INTERVENTION;  Surgeon: Sherren Mocha, MD;  Location: [facility];  Service: Cardiovascular;  Laterality: N/A;  . DIRECT LARYNGOSCOPY Left 10/09/2017   Procedure: DIRECT LARYNGOSCOPY WITH BOPSY;  Surgeon: Jodi Marble, MD;  Location: [facility];  Service: ENT;  Laterality: Left;  . ESOPHAGOGASTRODUODENOSCOPY (EGD) WITH ESOPHAGEAL DILATION     "couple times" (07/19/2016)  . ESOPHAGOSCOPY Left  10/09/2017   Procedure: ESOPHAGOSCOPY;  Surgeon: Jodi Marble, MD;  Location: [facility];  Service: ENT;  Laterality: Left;  . INTRAVASCULAR IMAGING/OCT N/A 04/27/2020   Procedure: INTRAVASCULAR IMAGING/OCT;  Surgeon: Sherren Mocha, MD;  Location: [facility];  Service: Cardiovascular;  Laterality: N/A;  . INTRAVASCULAR PRESSURE WIRE/FFR STUDY N/A 04/27/2020   Procedure: INTRAVASCULAR PRESSURE WIRE/FFR STUDY;  Surgeon: Sherren Mocha, MD;  Location: [facility];  Service: Cardiovascular;  Laterality: N/A;  . IR GASTROSTOMY TUBE MOD SED  01/08/2018  . IR THORACENTESIS ASP PLEURAL SPACE W/IMG GUIDE  07/19/2016  . LAPAROSCOPIC CHOLECYSTECTOMY  1984  . LUMBAR DISC SURGERY  05/1996   L4-5; Dr. Sherwood Gambler   . LUMBAR LAMINECTOMY/DECOMPRESSION MICRODISCECTOMY  10/2002   L3-4. Dr. Sherwood Gambler  . MULTIPLE TOOTH EXTRACTIONS  1980s  . PARTIAL GLOSSECTOMY  12/02/2017   Dr. Nicolette Bang- [facility]  . pharyngoplasty for closure of tingue base defect  12/02/2017   Dr. Nicolette Bang- [facility]  . PICC LINE INSERTION  07/15/2016  . POSTERIOR LUMBAR FUSION  09/1996   Ray cage, L4-5 Dr. Rita Ohara  . PROSTATE BIOPSY  ~ 2017  . radical tonsillectomy Left 12/02/2017   Dr. Nicolette [facility] at [facility]  . RIGHT HEART CATH AND CORONARY ANGIOGRAPHY N/A 03/31/2020   Procedure: RIGHT HEART CATH AND CORONARY ANGIOGRAPHY;  Surgeon: Larey Dresser, MD;  Location: [facility];  Service: Cardiovascular;  Laterality: N/A;  . RIGID BRONCHOSCOPY Left 10/09/2017   Procedure: RIGID BRONCHOSCOPY;  Surgeon: Jodi Marble, MD;  Location: [facility];  Service: ENT;  Laterality: Left;  . TEE WITHOUT CARDIOVERSION N/A 06/07/2020   Procedure: TRANSESOPHAGEAL ECHOCARDIOGRAM (TEE);  Surgeon: Sherren Mocha, MD;  Location: [place] [facility];  Service: Open Heart Surgery;  Laterality: N/A;  . TONSILLECTOMY    . TOTAL HIP ARTHROPLASTY Left 12/04/2020   Procedure: TOTAL HIP ARTHROPLASTY ANTERIOR APPROACH;  Surgeon: Rod Can, MD;  Location: [facility];  Service: Orthopedics;  Laterality: Left;  . TOTAL HIP ARTHROPLASTY Left 11/2020  . TRACHEOSTOMY  12/02/2017   Dr. Nicolette [facility]- [facility]  . ULTRASOUND GUIDANCE FOR VASCULAR ACCESS Right 06/07/2020   Procedure: ULTRASOUND GUIDANCE FOR VASCULAR ACCESS;  Surgeon: Sherren Mocha, MD;  Location: [facility];  Service: Open Heart Surgery;  Laterality: Right;  . ULTRASOUND GUIDANCE FOR VASCULAR ACCESS Bilateral 03/03/2021   Procedure: ULTRASOUND GUIDANCE FOR VASCULAR ACCESS, BILATERAL FEMORAL ARTERIES;  Surgeon: Waynetta Sandy, MD;  Location: [place];  Service: Vascular;  Laterality: Bilateral;  . VASCULAR SURGERY     Social History   Social History Narrative   ** Merged  History Encounter **       Married (3rd), [place]. 2 children from 1st marriage, 4 step children.    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       Cell # [id number]   Immunization History  Administered Date(s) Administered  . Fluad Quad(high Dose 65+) 10/15/2018, 11/06/2019, 10/20/2020  . Influenza Split 11/26/2010, 10/11/2011, 11/03/2012  . Influenza Whole 11/14/2006, 12/04/2007, 10/06/2008, 10/25/2009  . Influenza, High Dose Seasonal PF 11/01/2016, 10/30/2017  . Influenza, Seasonal, Injecte, Preservative Fre 12/07/2014  . Influenza,inj,Quad PF,6+ Mos 11/04/2015  . Influenza-Unspecified 11/19/2013  . PFIZER(Purple Top)SARS-COV-2 Vaccination 02/25/2019, 03/18/2019, 11/16/2019  . Pneumococcal Conjugate-13 12/07/2014  . Pneumococcal Polysaccharide-23 05/30/2017  . Tdap 01/28/2020     Objective: Vital Signs: BP (!) 105/57 (BP Location: Left Arm, Patient Position: Sitting, Cuff Size: Normal)   Pulse 74   Resp 17   Ht '5\' 7"'  (1.702 m)   Wt 167 lb 6.4 oz (75.9 kg)   BMI 26.22 kg/m    Physical Exam Vitals and nursing note reviewed.  Constitutional:      Appearance: He is well-developed.  HENT:     Head:  Normocephalic and atraumatic.  Eyes:     Conjunctiva/sclera: Conjunctivae normal.     Pupils: Pupils are equal, round, and reactive to light.  Cardiovascular:     Rate and Rhythm: Normal rate and regular rhythm.     Heart sounds: Normal heart sounds.  Pulmonary:     Effort: Pulmonary effort is normal.     Breath sounds: Normal breath sounds.  Abdominal:     General: Bowel sounds are normal.     Palpations: Abdomen is soft.  Musculoskeletal:     Cervical back: Normal range of motion and neck supple.  Skin:    General: Skin is warm and dry.     Capillary Refill: Capillary refill takes less than 2 seconds.  Neurological:     Mental Status: He is alert and oriented to person, place, and time.  Psychiatric:        Behavior: Behavior normal.     Musculoskeletal Exam: C-spine has limited range of motion.  Thoracic kyphosis noted.  Painful range of motion of the lumbar spine.  Shoulder joints, elbow joints, wrist joints, MCPs, PIPs, DIPs have good range of motion with no synovitis.  He has PIP and DIP thickening consistent with osteoarthritis of both hands.  CMC joint prominence and thickening noted bilaterally.  Left hip replacement has good range of motion.  Right hip joint has good range of motion with no groin pain.  Knee joints have good range of motion with no warmth or effusion.  Ankle joints have good range of motion with no tenderness or joint swelling.  CDAI Exam: CDAI Score: -- Patient Global: --; Provider Global: -- Swollen: --; Tender: -- Joint Exam 10/12/2021   No joint exam has been documented for this visit   There is currently no information documented on the homunculus. Go to the Rheumatology activity and complete the homunculus joint exam.  Investigation: No additional findings.  Imaging: No results found.  Recent Labs: Lab Results  Component Value Date   WBC 6.8 09/01/2021   HGB 10.2 (L) 09/01/2021   PLT 103.0 (L) 09/01/2021   NA 136 09/01/2021   K 4.3  09/01/2021   CL 98 09/01/2021   CO2 32 09/01/2021   GLUCOSE 162 (H) 09/01/2021  BUN 16 09/01/2021   CREATININE 0.73 09/01/2021   BILITOT 0.5 09/01/2021   ALKPHOS 90 09/01/2021   AST 12 09/01/2021   ALT 14 09/01/2021   PROT 5.7 (L) 09/01/2021   ALBUMIN 3.7 09/01/2021   CALCIUM 8.7 09/01/2021   GFRAA 107 08/11/2020   QFTBGOLDPLUS NEGATIVE 01/11/2020    Speciality Comments:  Fosamax started on January 26, 2021.  If he has dysphagia with Fosamax or any GI intolerance we will have to switch him to Reclast.  Procedures:  No procedures performed Allergies: Celebrex [celecoxib] and Tape   Assessment / Plan:     Visit Diagnoses: Polymyalgia rheumatica ([place]) -  History of muscle weakness and tenderness, elevated sed rate. Prednisone responsive: He has not had any signs or symptoms of a polymyalgia rheumatica flare.  He is not currently taking any immunosuppressive agents.  At his last office visit on 07/06/2021 the plan was to have him reduce prednisone by 1 mg every 2 months.  He misunderstood and has been tapering prednisone by 1 mg every month.  He reduce the dose of prednisone to 5 mg daily at the beginning of September.  Since reducing his prednisone dose less than 7 mg he has noticed increased pain and stiffness especially in both hands.  On examination today he has no signs of inflammatory arthritis at this time.  No synovitis was noted.  He had no difficulty rising from a seated position or raising his arms above his head.  He is not experiencing any increased muscular weakness or muscle tenderness at this time. ESR will be checked today.  He was advised to notify us if he develops signs or symptoms of a flare as he continues to reduce prednisone further.  He will taper prednisone by 1 mg every 2 months.  He will follow-up in the office in 3 months or sooner if needed.- Plan: Sedimentation rate  Long term (current) use of systemic steroids: Patient is currently on prednisone 5 mg 1 tablet  by mouth daily.  The plan at his last office visit on 07/06/2021 was to try reducing prednisone by 1 mg every 2 months.  The patient misunderstood and has been tapering prednisone by 1 mg every month.  He has been noticing some increased joint pain and stiffness especially in both hands since reducing the dose of prednisone to less than 7 mg. The plan is to slow down the taper and start reducing by 1 mg every 2 months for now.  He will notify us if he cannot further taper the dose of prednisone going forward.  He will continue to follow-up closely.  He is aware of the risks of long-term prednisone use.  He will remain on Fosamax 70 mg 1 tablet weekly.  Primary osteoarthritis of both hands: He has PIP and DIP thickening consistent with osteoarthritis of both hands.  CMC joint prominence and thickening noted bilaterally.  No synovitis was noted over his wrist joints, MCPs, PIP joints.  He has been experiencing increased pain and stiffness in both hands as he has been reducing the dose of prednisone by 1 mg every month.  His symptoms worsened when reducing his dose to 6 mg in August and he is currently on 5 mg daily. He has no signs of inflammatory arthritis at this time. Discussed the importance of joint protection and muscle strengthening.  Status post total hip replacement, left: 12/04/20: due to a fall. Doing well. No discomfort at this time.   DDD (degenerative disc disease), lumbar:  Chronic pain.  Under the care of Dr. Nelva Bush.  He has had 2 injections with no improvement.  He has been having difficulty ambulating for long distances due to discomfort in his lower back.  He is using a cane to assist with ambulation.  He continues to follow-up with Dr. Dagoberto Ligas at pain management on a regular basis.  His pain level has been inadequately controlled but he recently increased his oxycodone dose to 10 mg every 6 hours for pain relief.  He had an MRI of the lumbar spine last week and will be following up with Dr.  Nelva Bush next week to discuss results and the neck steps in treatment.  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  He continues to take allopurinol 200 mg daily.  Uric acid level will be checked today.  Plan: Uric acid  Osteopenia of multiple sites - DEXA scan 07/21/20: showed T score -2.2 in the right femoral neck, BMD 0.638.  No comparison available.he remains on long-term prednisone.  He was started on Fosamax 70 mg 1 tablet once weekly in December 2022.  He has been tolerating Fosamax without any side effects.  He continues to take calcium and vitamin D supplement daily.  He has not had any recent falls he continues to use a cane to assist with ambulation.  His next bone density will be due in June 2024.  Vitamin D deficiency -Vitamin D was 25 on 01/26/21. He is taking a calcium and vitamin D supplement daily.  Vitamin D level will be checked today.  Plan: VITAMIN D 25 Hydroxy (Vit-D Deficiency, Fractures)  Chronic pain syndrome - Dr. Jerrel Ivory office visit note from 10/06/21.   Psoriasis: No active psoriasis at this time. No symptoms of psoriatic arthritis noted.   Other medical conditions are listed as follows:   Oropharyngeal dysphagia  Aneurysm of infrarenal abdominal aorta, unspecified whether ruptured (HCC)  History of diverticulitis  Liver abscess  History of gastroesophageal reflux (GERD)  History of aortic valve replacement  Essential hypertension  History of hyperlipidemia  Cancer of tonsillar fossa (HCC)  Anxiety and depression  Former smoker  History of recent hospitalization  Orders: Orders Placed This Encounter  Procedures  . VITAMIN D 25 Hydroxy (Vit-D Deficiency, Fractures)  . Sedimentation rate  . Uric acid   No orders of the defined types were placed in  this encounter.     Follow-Up Instructions: Return in about 3 months (around 01/11/2022) for Polymyalgia Rheumatica, Gout, Osteoarthritis, DDD.   Ofilia Neas, PA-C  Note - This record has been created using Dragon software.  Chart creation errors have been sought, but may not always  have been located. Such creation errors do not reflect on  the standard of medical care.

## 2021-10-02 ENCOUNTER — Telehealth: Payer: Self-pay

## 2021-10-02 MED ORDER — OXYCODONE HCL 10 MG PO TABS: 10.0000 mg | ORAL_TABLET | ORAL | 0 refills | Status: DC | PRN

## 2021-10-02 NOTE — Addendum Note (Signed)
Addended by: Courtney Heys on: 10/02/2021 01:01 PM   Modules accepted: Orders

## 2021-10-02 NOTE — Telephone Encounter (Signed)
Patient is calling for a refill on Oxycodone 5 mg. Per PMP, last fill was 08/29/21

## 2021-10-04 ENCOUNTER — Telehealth: Payer: Self-pay | Admitting: *Deleted

## 2021-10-04 NOTE — Telephone Encounter (Signed)
Brandon [name] is calling for a refill on his oxycodone. Per PMP last fill date was 08/29/21. He has an appt with you 10/06/21.

## 2021-10-04 NOTE — Telephone Encounter (Signed)
Notified. 

## 2021-10-05 ENCOUNTER — Inpatient Hospital Stay: Payer: Medicare Other | Attending: Adult Health | Admitting: Adult Health

## 2021-10-06 ENCOUNTER — Encounter
Payer: Medicare Other | Attending: Physical Medicine and Rehabilitation | Admitting: Physical Medicine and Rehabilitation

## 2021-10-06 ENCOUNTER — Encounter: Payer: Self-pay | Admitting: Physical Medicine and Rehabilitation

## 2021-10-06 VITALS — BP 121/70 | HR 79 | Ht 66.0 in | Wt 167.6 lb

## 2021-10-06 DIAGNOSIS — M353 Polymyalgia rheumatica: Secondary | ICD-10-CM | POA: Insufficient documentation

## 2021-10-06 DIAGNOSIS — G894 Chronic pain syndrome: Secondary | ICD-10-CM | POA: Insufficient documentation

## 2021-10-06 DIAGNOSIS — M47816 Spondylosis without myelopathy or radiculopathy, lumbar region: Secondary | ICD-10-CM | POA: Insufficient documentation

## 2021-10-06 DIAGNOSIS — M5416 Radiculopathy, lumbar region: Secondary | ICD-10-CM | POA: Diagnosis not present

## 2021-10-06 MED ORDER — FLUOXETINE HCL 40 MG PO CAPS: 40.0000 mg | ORAL_CAPSULE | Freq: Every day | ORAL | 5 refills | Status: DC

## 2021-10-06 MED ORDER — GABAPENTIN 400 MG PO CAPS: 400.0000 mg | ORAL_CAPSULE | Freq: Two times a day (BID) | ORAL | 5 refills | Status: DC

## 2021-10-06 MED ORDER — TRAMADOL HCL 50 MG PO TABS: 100.0000 mg | ORAL_TABLET | Freq: Two times a day (BID) | ORAL | 5 refills | Status: DC

## 2021-10-06 NOTE — Patient Instructions (Signed)
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 S/p L THR- 12/02/20  Getting AAA as of 1/23.   Continue Oxycodone 10 mg q4 hours prn #150- 5 pills/day. If 5 mg will do it, will take less/5 mg- which I agree with.   2. Continue Tramadol 10 mg BID/2x/day- for restless leg syndrome.  5 refills.   3. Stop Celexa/Citalopram- 20 mg daily- since cannot increase dose- and will switch to Prozac 40 mg daily- for mood-     4. Con't gabapentin- but  will increase to 40 '0mg'$  2x/day- for nerve pain- #60- 5 refills.   5. Will do UDS/oral drug screen next time because has to get to MRI scheduled at 240 today- is already 2:20pm.   6. F/U in 3 months- for chronic pain

## 2021-10-06 NOTE — Progress Notes (Signed)
Subjective:    Patient ID: Brandon [name], male    DOB: 11-29-1940, 81 y.o.   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 S/p L THR- 12/02/20  Got AAA fixed as of 1/23.   Dysphagia due to "something not closing"- 2 bouts of pneumonia due to this in past few months.  Had to postpone MRI of back until today at 240pm.   Dr Nelva Bush trying to decide what to do for spinal injections in back.   Stays hurting 24 hours/day.  Has done slightly better with increased dose of Oxycodone  Plans to take some time to go to beach for a few weeks. Tired of medical stuff.   Just got refill of pain meds on 10/02/21.  So far, helping some to have 5 pills/day now.   Weaning prednisone for PMR- down to 6 mg daily-  Hands still feel like like in vise.   Not sure if there's anything else to be done. Doesn't want to abuse pain meds-   Also on gabapentin 300 mg BID-  Takes tramadol 100 mg BID for restless leg syndrome.    Feels like mood is not as good because pain so limiting-    Pain Inventory Average Pain 4 Pain Right Now 4 My pain is intermittent, sharp, and aching  In the last 24 hours, has pain interfered with the following? General activity 5 Relation with others 5 Enjoyment of life 5 What TIME of day is your pain at its worst? daytime Sleep (in general) Good  Pain is worse with: walking, bending, standing, and some activites Pain improves with: rest and medication Relief from Meds: 6  Family History  Problem Relation Age of Onset   Heart disease Father 110       Living   Coronary artery disease Father         CABG   Alzheimer's disease Mother 64       Deceased   Arthritis Mother    Aneurysm Brother    Stomach cancer Maternal Uncle    Brain cancer Maternal Aunt        x2   Obesity Daughter        Had Bypass Sx   Social History   Socioeconomic History   Marital status: Married    Spouse name: etta   Number of children: 2   Years of education: Not on file   Highest education level: Not on file  Occupational History   Occupation: retired    Fish farm manager: RETIRED    Comment: disabled due to back problems  Tobacco Use   Smoking status: Former    Packs/day: 1.50    Years: 50.00    Total pack years: 75.00    Types: Cigarettes    Quit date: 09/05/2017    Years since quitting: 4.0   Smokeless tobacco: Never   Tobacco comments:    smoked less than 1 ppd for 40+ years;   Vaping Use   Vaping Use: Never used  Substance and Sexual Activity   Alcohol  use: Not Currently   Drug use: Yes    Types: Oxycodone    Comment: has prescription   Sexual activity: Not Currently  Other Topics Concern   Not on file  Social History Narrative   ** Merged History Encounter **       Married (3rd), [place]. 2 children from 1st marriage, 4 step children.    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       Cell # [phone]   Social Determinants of Health   Financial Resource Strain: Low Risk  (06/07/2021)   Overall Financial Resource Strain (CARDIA)    Difficulty of Paying Living Expenses: Not hard at all  Food Insecurity: No Food Insecurity (06/07/2021)   Hunger Vital Sign    Worried About Running Out of Food in the Last Year: Never true    [place] in the Last Year: Never true  Transportation Needs: No Transportation Needs (06/07/2021)   PRAPARE - Hydrologist (Medical): No    Lack of Transportation (Non-Medical): No  Physical Activity: Inactive (06/07/2021)   Exercise Vital Sign    Days  of Exercise per Week: 0 days    Minutes of Exercise per Session: 0 min  Stress: No Stress Concern Present (06/07/2021)   [place]    Feeling of Stress : Not at all  Social Connections: Moderately Integrated (06/07/2021)   Social Connection and Isolation Panel [NHANES]    Frequency of Communication with Friends and Family: Twice a week    Frequency of Social Gatherings with Friends and Family: Twice a week    Attends Religious Services: More than 4 times per year    Active Member of [employer] or Organizations: No    Attends Archivist Meetings: Never    Marital Status: Married   Past Surgical History:  Procedure Laterality Date   ABDOMINAL AORTIC ENDOVASCULAR STENT GRAFT N/A 03/03/2021   Procedure: ABDOMINAL AORTIC ENDOVASCULAR STENT GRAFT;  Surgeon: Waynetta Sandy, MD;  Location: [place];  Service: Vascular;  Laterality: N/A;   ABDOMINAL AORTOGRAM W/LOWER EXTREMITY N/A 04/10/2021   Procedure: ABDOMINAL AORTOGRAM W/LOWER EXTREMITY;  Surgeon: Waynetta Sandy, MD;  Location: [place] [facility] [facility];  Service: Cardiovascular;  Laterality: N/A;   BACK SURGERY     CARDIAC CATHETERIZATION     CATARACT EXTRACTION W/ INTRAOCULAR LENS  IMPLANT, BILATERAL Bilateral 01/2012 - 02/2012   CORONARY ATHERECTOMY N/A 04/27/2020   Procedure: CORONARY ATHERECTOMY;  Surgeon: Sherren Mocha, MD;  Location: [place];  Service: Cardiovascular;  Laterality: N/A;   CORONARY STENT INTERVENTION N/A 04/27/2020   Procedure: CORONARY STENT INTERVENTION;  Surgeon: Sherren Mocha, MD;  Location: [place];  Service: Cardiovascular;  Laterality: N/A;   DIRECT LARYNGOSCOPY Left 10/09/2017   Procedure: DIRECT LARYNGOSCOPY WITH BOPSY;  Surgeon: Jodi Marble, MD;  Location: [place];  Service: ENT;  Laterality: Left;   ESOPHAGOGASTRODUODENOSCOPY (EGD) WITH ESOPHAGEAL DILATION     "couple times" (07/19/2016)    ESOPHAGOSCOPY Left 10/09/2017   Procedure: ESOPHAGOSCOPY;  Surgeon: Jodi Marble, MD;  Location: [place];  Service: ENT;  Laterality: Left;   INTRAVASCULAR IMAGING/OCT N/A 04/27/2020   Procedure: INTRAVASCULAR IMAGING/OCT;  Surgeon: Sherren Mocha, MD;  Location: [place] [facility];  Service: Cardiovascular;  Laterality: N/A;   INTRAVASCULAR PRESSURE WIRE/FFR STUDY  N/A 04/27/2020   Procedure: INTRAVASCULAR PRESSURE WIRE/FFR STUDY;  Surgeon: Sherren Mocha, MD;  Location: [place];  Service: Cardiovascular;  Laterality: N/A;   IR GASTROSTOMY TUBE MOD SED  01/08/2018   IR THORACENTESIS ASP PLEURAL SPACE W/IMG GUIDE  07/19/2016   LAPAROSCOPIC CHOLECYSTECTOMY  1984   LUMBAR [place] SURGERY  05/1996   L4-5; Dr. Claudean Kinds LAMINECTOMY/DECOMPRESSION MICRODISCECTOMY  10/2002   L3-4. Dr. Sherwood Gambler   MULTIPLE TOOTH EXTRACTIONS  1980s   PARTIAL GLOSSECTOMY  12/02/2017   Dr. Nicolette [facility]- [facility]   pharyngoplasty for closure of tingue base defect  12/02/2017   Dr. Nicolette Bang- [place]  07/15/2016   POSTERIOR LUMBAR FUSION  09/1996   Ray cage, L4-5 Dr. Rita Ohara   PROSTATE BIOPSY  ~ 2017   radical tonsillectomy Left 12/02/2017   Dr. Nicolette Bang at [place] N/A 03/31/2020   Procedure: RIGHT HEART CATH AND CORONARY ANGIOGRAPHY;  Surgeon: Larey Dresser, MD;  Location: [place] [facility];  Service: Cardiovascular;  Laterality: N/A;   RIGID BRONCHOSCOPY Left 10/09/2017   Procedure: RIGID BRONCHOSCOPY;  Surgeon: Jodi Marble, MD;  Location: [place];  Service: ENT;  Laterality: Left;   TEE WITHOUT CARDIOVERSION N/A 06/07/2020   Procedure: TRANSESOPHAGEAL ECHOCARDIOGRAM (TEE);  Surgeon: Sherren Mocha, MD;  Location: [place];  Service: Open Heart Surgery;  Laterality: N/A;   TONSILLECTOMY     TOTAL HIP ARTHROPLASTY Left 12/04/2020   Procedure: TOTAL HIP ARTHROPLASTY ANTERIOR APPROACH;  Surgeon:  Rod Can, MD;  [facility];  Service: Orthopedics;  Laterality: Left;   TOTAL HIP ARTHROPLASTY Left 11/2020   TRACHEOSTOMY  12/02/2017   Dr. Nicolette Bang- [facility]   ULTRASOUND GUIDANCE FOR VASCULAR ACCESS Right 06/07/2020   Procedure: ULTRASOUND GUIDANCE FOR VASCULAR ACCESS;  Surgeon: Sherren Mocha, MD;  Location: [place];  Service: Open Heart Surgery;  Laterality: Right;   ULTRASOUND GUIDANCE FOR VASCULAR ACCESS Bilateral 03/03/2021   Procedure: ULTRASOUND GUIDANCE FOR VASCULAR ACCESS, BILATERAL FEMORAL ARTERIES;  Surgeon: Waynetta Sandy, MD;  Location: [place];  Service: Vascular;  Laterality: Bilateral;   VASCULAR SURGERY     Past Surgical History:  Procedure Laterality Date   ABDOMINAL AORTIC ENDOVASCULAR STENT GRAFT N/A 03/03/2021   Procedure: ABDOMINAL AORTIC ENDOVASCULAR STENT GRAFT;  Surgeon: Waynetta Sandy, MD;  Location: [place];  Service: Vascular;  Laterality: N/A;   ABDOMINAL AORTOGRAM W/LOWER EXTREMITY N/A 04/10/2021   Procedure: ABDOMINAL AORTOGRAM W/LOWER EXTREMITY;  Surgeon: Waynetta Sandy, MD;  Location: [place] [facility];  Service: Cardiovascular;  Laterality: N/A;   BACK SURGERY     CARDIAC CATHETERIZATION     CATARACT EXTRACTION W/ INTRAOCULAR LENS  IMPLANT, BILATERAL Bilateral 01/2012 - 02/2012   CORONARY ATHERECTOMY N/A 04/27/2020   Procedure: CORONARY ATHERECTOMY;  Surgeon: Sherren Mocha, MD;  Location: [place] [facility];  Service: Cardiovascular;  Laterality: N/A;   CORONARY STENT INTERVENTION N/A 04/27/2020   Procedure: CORONARY STENT INTERVENTION;  Surgeon: Sherren Mocha, MD;  Location: [place] [facility];  Service: Cardiovascular;  Laterality: N/A;   DIRECT LARYNGOSCOPY Left 10/09/2017   Procedure: DIRECT LARYNGOSCOPY WITH BOPSY;  Surgeon: Jodi Marble, MD;  Location: [place];  Service: ENT;  Laterality: Left;   ESOPHAGOGASTRODUODENOSCOPY (EGD) WITH ESOPHAGEAL DILATION     "couple times" (07/19/2016)    ESOPHAGOSCOPY Left 10/09/2017   Procedure: ESOPHAGOSCOPY;  Surgeon: Jodi Marble, MD;  Location: [place];  Service: ENT;  Laterality: Left;   INTRAVASCULAR IMAGING/OCT N/A 04/27/2020   Procedure: INTRAVASCULAR IMAGING/OCT;  Surgeon: Sherren Mocha, MD;  Location: [place] [facility];  Service: Cardiovascular;  Laterality: N/A;   INTRAVASCULAR PRESSURE WIRE/FFR STUDY N/A 04/27/2020   Procedure: INTRAVASCULAR PRESSURE WIRE/FFR STUDY;  Surgeon: Sherren Mocha, MD;  Location: [place] [facility];  Service: Cardiovascular;  Laterality: N/A;   IR GASTROSTOMY TUBE MOD SED  01/08/2018   IR THORACENTESIS ASP PLEURAL SPACE W/IMG GUIDE  07/19/2016   LAPAROSCOPIC CHOLECYSTECTOMY  1984   LUMBAR [place] SURGERY  05/1996   L4-5; Dr. Claudean Kinds LAMINECTOMY/DECOMPRESSION MICRODISCECTOMY  10/2002   L3-4. Dr. Sherwood Gambler   MULTIPLE TOOTH EXTRACTIONS  1980s   PARTIAL GLOSSECTOMY  12/02/2017   [facility]ico[facility]- [facility]   pharyngoplasty for closure of tingue base defect  12/02/2017   Dr. Nicolette Bang- [place]  07/15/2016   POSTERIOR LUMBAR FUSION  09/1996   Ray cage, L4-5 Dr. Rita Ohara   PROSTATE BIOPSY  ~ 2017   radical tonsillectomy Left 12/02/2017   Dr. Nicolette Bang at [place] N/A 03/31/2020   Procedure: RIGHT HEART CATH AND CORONARY ANGIOGRAPHY;  Surgeon: Larey Dresser, MD;  Location: [place] [facility];  Service: Cardiovascular;  Laterality: N/A;   RIGID BRONCHOSCOPY Left 10/09/2017   Procedure: RIGID BRONCHOSCOPY;  Surgeon: Jodi Marble, MD;  Location: [place];  Service: ENT;  Laterality: Left;   TEE WITHOUT CARDIOVERSION N/A 06/07/2020   Procedure: TRANSESOPHAGEAL ECHOCARDIOGRAM (TEE);  Surgeon: Sherren Mocha, MD;  Location: [place];  Service: Open Heart Surgery;  Laterality: N/A;   TONSILLECTOMY     TOTAL HIP ARTHROPLASTY Left 12/04/2020   Procedure: TOTAL HIP ARTHROPLASTY ANTERIOR APPROACH;  Surgeon: Rod Can, MD;  [facility];  Service: Orthopedics;  Laterality: Left;   TOTAL HIP ARTHROPLASTY Left 11/2020   TRACHEOSTOMY  12/02/2017   Dr. Nicolette Bang- [facility]   ULTRASOUND GUIDANCE FOR VASCULAR ACCESS Right 06/07/2020   Procedure: ULTRASOUND GUIDANCE FOR VASCULAR ACCESS;  Surgeon: Sherren Mocha, MD;  Location: [place];  Service: Open Heart Surgery;  Laterality: Right;   ULTRASOUND GUIDANCE FOR VASCULAR ACCESS Bilateral 03/03/2021   Procedure: ULTRASOUND GUIDANCE FOR VASCULAR ACCESS, BILATERAL FEMORAL ARTERIES;  Surgeon: Waynetta Sandy, MD;  Location: [place];  Service: Vascular;  Laterality: Bilateral;   VASCULAR SURGERY     Past Medical History:  Diagnosis Date   Abdominal aneurysm ([place])    Arthritis    "all over" (07/19/2016)   BPH (benign prostatic hypertrophy)    CAD (coronary artery disease)    Chicken pox    Chronic lower back pain    s/p surgical fusion   Depression    Diverticulosis    Esophageal stricture    GERD (gastroesophageal reflux disease)    Hepatitis B 1984   Hiatal hernia    History of radiation therapy 01/16/18- 03/05/18   Left Tonsil, 66 Gy in 33 fractions to high risk nodal echelons.    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 Burt Knack and Dr Cyndia Bent    Severe aortic stenosis    TIA (transient ischemic attack) 1990s   hx   tonsillar ca dx'd 11/2017   Tubular adenoma of colon 2009  There were no vitals taken for this visit.  Opioid Risk Score:   Fall Risk Score:  `1  Depression screen PHQ 2/9     06/07/2021    4:02 PM 06/07/2021    4:00 PM 06/02/2021   11:03 AM 04/24/2021   11:02 AM 02/27/2021    1:57 PM 02/15/2020    1:18 PM 02/08/2020   11:31 AM  Depression screen PHQ 2/9  Decreased Interest 0 0 0 0 0 0 1  Down, Depressed, Hopeless 0 0 0 0 0 1 1  PHQ - 2  Score 0 0 0 0 0 1 2  Altered sleeping    0     Tired, decreased energy    0     Change in appetite    0     Feeling bad or failure about yourself     0     Trouble concentrating    0     Moving slowly or fidgety/restless    0     Suicidal thoughts    0     PHQ-9 Score    0     Difficult doing work/chores    Not difficult at all        Review of Systems  Musculoskeletal:  Positive for back pain.      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 S/p L THR- 12/02/20  Getting AAA as of 1/23.   Continue Oxycodone 10 mg q4 hours prn #150- 5 pills/day. If 5 mg will do it, will take less/5 mg- which I agree with.   2. Continue Tramadol 10 mg BID/2x/day- for restless leg syndrome.  5 refills.   3. Stop Celexa/Citalopram- 20 mg daily- since cannot increase dose- and will switch to Prozac 40 mg daily- for mood-     4. Con't gabapentin- but  will increase to 40 '0mg'$  2x/day- for nerve pain- #60- 5 refills.   5. Will do UDS/oral drug screen next time because has to get to MRI scheduled at 240 today- is already 2:20pm.   6. F/U in 3 months- for chronic pain   I spent a total of  21  minutes on total care today- >50% coordination of care- due to discussion of mood and how to help overall pain- increasing gabapentin

## 2021-10-07 ENCOUNTER — Other Ambulatory Visit: Payer: Self-pay | Admitting: Physician Assistant

## 2021-10-10 NOTE — Telephone Encounter (Signed)
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 daily   Labs: 09/01/2021 Glucose 162, Total Protein 5.7, RBC 3.53, Platelets 103, Hgb 10.2, Hct 31.6, RDW 17.9   Okay to refill Allopurinol?

## 2021-10-12 ENCOUNTER — Encounter: Payer: Self-pay | Admitting: Physician Assistant

## 2021-10-12 ENCOUNTER — Ambulatory Visit: Payer: Medicare Other | Attending: Physician Assistant | Admitting: Physician Assistant

## 2021-10-12 VITALS — BP 105/57 | HR 74 | Resp 17 | Ht 67.0 in | Wt 167.4 lb

## 2021-10-12 DIAGNOSIS — Z8639 Personal history of other endocrine, nutritional and metabolic disease: Secondary | ICD-10-CM

## 2021-10-12 DIAGNOSIS — M5136 Other intervertebral disc degeneration, lumbar region: Secondary | ICD-10-CM

## 2021-10-12 DIAGNOSIS — M19041 Primary osteoarthritis, right hand: Secondary | ICD-10-CM | POA: Diagnosis not present

## 2021-10-12 DIAGNOSIS — E559 Vitamin D deficiency, unspecified: Secondary | ICD-10-CM

## 2021-10-12 DIAGNOSIS — I7143 Infrarenal abdominal aortic aneurysm, without rupture: Secondary | ICD-10-CM

## 2021-10-12 DIAGNOSIS — M353 Polymyalgia rheumatica: Secondary | ICD-10-CM | POA: Diagnosis not present

## 2021-10-12 DIAGNOSIS — G894 Chronic pain syndrome: Secondary | ICD-10-CM

## 2021-10-12 DIAGNOSIS — R1312 Dysphagia, oropharyngeal phase: Secondary | ICD-10-CM

## 2021-10-12 DIAGNOSIS — M19042 Primary osteoarthritis, left hand: Secondary | ICD-10-CM

## 2021-10-12 DIAGNOSIS — F32A Depression, unspecified: Secondary | ICD-10-CM

## 2021-10-12 DIAGNOSIS — Z87891 Personal history of nicotine dependence: Secondary | ICD-10-CM

## 2021-10-12 DIAGNOSIS — M8589 Other specified disorders of bone density and structure, multiple sites: Secondary | ICD-10-CM

## 2021-10-12 DIAGNOSIS — Z8719 Personal history of other diseases of the digestive system: Secondary | ICD-10-CM

## 2021-10-12 DIAGNOSIS — M51369 Other intervertebral disc degeneration, lumbar region without mention of lumbar back pain or lower extremity pain: Secondary | ICD-10-CM

## 2021-10-12 DIAGNOSIS — Z952 Presence of prosthetic heart valve: Secondary | ICD-10-CM

## 2021-10-12 DIAGNOSIS — Z7952 Long term (current) use of systemic steroids: Secondary | ICD-10-CM

## 2021-10-12 DIAGNOSIS — M1A0210 Idiopathic chronic gout, right elbow, without tophus (tophi): Secondary | ICD-10-CM

## 2021-10-12 DIAGNOSIS — C090 Malignant neoplasm of tonsillar fossa: Secondary | ICD-10-CM

## 2021-10-12 DIAGNOSIS — F419 Anxiety disorder, unspecified: Secondary | ICD-10-CM

## 2021-10-12 DIAGNOSIS — Z96642 Presence of left artificial hip joint: Secondary | ICD-10-CM | POA: Diagnosis not present

## 2021-10-12 DIAGNOSIS — L409 Psoriasis, unspecified: Secondary | ICD-10-CM

## 2021-10-12 DIAGNOSIS — I10 Essential (primary) hypertension: Secondary | ICD-10-CM

## 2021-10-12 DIAGNOSIS — Z9289 Personal history of other medical treatment: Secondary | ICD-10-CM

## 2021-10-12 DIAGNOSIS — K750 Abscess of liver: Secondary | ICD-10-CM

## 2021-10-13 ENCOUNTER — Encounter: Payer: Self-pay | Admitting: *Deleted

## 2021-10-13 LAB — VITAMIN D 25 HYDROXY (VIT D DEFICIENCY, FRACTURES): Vit D, 25-Hydroxy: 34 ng/mL (ref 30–100)

## 2021-10-13 LAB — SEDIMENTATION RATE: Sed Rate: 11 mm/h (ref 0–20)

## 2021-10-13 LAB — URIC ACID: Uric Acid, Serum: 6.3 mg/dL (ref 4.0–8.0)

## 2021-10-13 NOTE — Progress Notes (Signed)
Vitamin D WNL.  ESR WNL.  Uric acid is 6.3.  ideally his uric acid should be less than 6.  Please make sure the patient is taking allopurinol as prescribed.  Avoid a purine rich diet.

## 2021-10-16 ENCOUNTER — Telehealth: Payer: Self-pay | Admitting: Family Medicine

## 2021-10-16 NOTE — Telephone Encounter (Signed)
Caller Name: Roarke Marciano Call back phone #: [phone]  Reason for Call: Please give pt a call back. He is dealing with fever-like symptoms

## 2021-10-17 DIAGNOSIS — M5136 Other intervertebral disc degeneration, lumbar region: Secondary | ICD-10-CM | POA: Diagnosis not present

## 2021-10-17 DIAGNOSIS — M5416 Radiculopathy, lumbar region: Secondary | ICD-10-CM | POA: Diagnosis not present

## 2021-10-17 DIAGNOSIS — I714 Abdominal aortic aneurysm, without rupture, unspecified: Secondary | ICD-10-CM | POA: Diagnosis not present

## 2021-10-18 ENCOUNTER — Telehealth: Payer: Self-pay

## 2021-10-18 ENCOUNTER — Encounter: Payer: Self-pay | Admitting: Family Medicine

## 2021-10-18 ENCOUNTER — Ambulatory Visit (INDEPENDENT_AMBULATORY_CARE_PROVIDER_SITE_OTHER): Payer: Medicare Other | Admitting: Family Medicine

## 2021-10-18 VITALS — BP 118/60 | HR 83 | Temp 97.8°F | Ht 67.0 in | Wt 162.8 lb

## 2021-10-18 DIAGNOSIS — I5032 Chronic diastolic (congestive) heart failure: Secondary | ICD-10-CM

## 2021-10-18 DIAGNOSIS — I2510 Atherosclerotic heart disease of native coronary artery without angina pectoris: Secondary | ICD-10-CM

## 2021-10-18 DIAGNOSIS — J440 Chronic obstructive pulmonary disease with acute lower respiratory infection: Secondary | ICD-10-CM

## 2021-10-18 DIAGNOSIS — I10 Essential (primary) hypertension: Secondary | ICD-10-CM

## 2021-10-18 DIAGNOSIS — M353 Polymyalgia rheumatica: Secondary | ICD-10-CM

## 2021-10-18 MED ORDER — PREDNISONE 20 MG PO TABS: 20.0000 mg | ORAL_TABLET | Freq: Every day | ORAL | 0 refills | Status: DC

## 2021-10-18 MED ORDER — AMOXICILLIN-POT CLAVULANATE 875-125 MG PO TABS: 1.0000 | ORAL_TABLET | Freq: Two times a day (BID) | ORAL | 0 refills | Status: DC

## 2021-10-18 NOTE — Telephone Encounter (Signed)
Brandon [name] stated Dr. Dossie Der said he needs to increase pain medication. Because he has a problems with his back. And will not be able to get his lumbar injection for two and half weeks. Please call Brandon [name] [phone].

## 2021-10-18 NOTE — Telephone Encounter (Signed)
Spoke to patient on 10/17/21 and he was having on/off fevers, sweats, and no appitite. Advised to take a covid test and call us back.   Tested negative and was seen toady for regular appointment.  Dm/cma

## 2021-10-18 NOTE — Progress Notes (Signed)
[facility] [address] [place] [address] Dept: [phone] Dept Fax: [phone]  Chronic Care Office Visit  Subjective:    Patient ID: Brandon [name], male    DOB: 31-May-1940, 81 y.o..   MRN: [id number]  Chief Complaint  Patient presents with   Follow-up    3 month f/u.   C/o having fevers off/on, hot flashes/chills last 2-3 days.  Negative home covid test 10/17/21.      History of Present Illness:  Patient is in today for reassessment of chronic medical issues.  [name] has a history of coronary artery disease, chronic diastolic heart failure, and hypertension. He is managed on aspirin 81 mg daily, atorvastatin 80 mg daily, ezetimibe 10 mg daily, carvedilol 12.5 mg daily, amlodipine 10 mg daily and lisinopril 20 mg daily. He notes that he is no longer taking Lasix. He has not noted nay increased swelling in his legs since stopping the diuretic. He had some low blood pressures earlier this summer and held off on his amlodipine for a while, but has been able to restart this.   [name] has a history of COPD. He is managed on albuterol and tiotropium. 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 He also has some chronic allergic rhinitis and has had prior tonsillar cancer, s/p surgical excision and radiation. He notes over the past several days that he was feeling poorly more generally. He continues to cough up sputum every day and has not noted this to be of a different color. He continues to have some joint pains, esp. in his shoulders and hands.  Past Medical History: Patient Active Problem List   Diagnosis Date Noted   Hypotension 08/18/2021   AKI (acute kidney injury) ([place]) 08/17/2021   Hyponatremia 06/26/2021   Hyperkalemia 97/03/6376   Acute metabolic encephalopathy 58/85/0277   Myoclonic jerking 06/26/2021   Lumbar radiculopathy 06/08/2021   Lumbar postlaminectomy syndrome 05/03/2021    Degeneration of lumbar intervertebral disc 05/03/2021   Lumbar spondylosis 05/03/2021   History of hip fracture 04/24/2021   S/P abdominal aortic aneurysm repair 04/24/2021   Chronic allergic rhinitis 01/24/2021   Demand ischemia ([place])    Acute respiratory failure with hypoxia (HCC)    Elevated troponin    Chronic diastolic heart failure ([place]) 12/03/2020   Steatosis of liver 11/01/2020   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   Hepatitis B core antibody positive 11/01/2020   Benign prostatic hyperplasia 11/01/2020   Chronic obstructive pulmonary disease ([place]) 07/20/2020   History of placement of stent in LAD coronary artery 07/20/2020   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   TIA (transient ischemic attack)    Esophageal stricture    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[place]) 04/19/2020   Primary osteoarthritis of both hands 01/11/2020   Piriformis syndrome of both sides 09/23/2019   Pain of both shoulder joints 07/29/2019   Sciatica associated with disorder of lumbar spine 04/24/2019   Primary osteoarthritis involving multiple joints 04/24/2019   Chronic pain syndrome 04/24/2019   Mixed anxiety and depressive disorder 04/15/2018   Oropharyngeal dysphagia 03/11/2018   Cancer of tonsillar fossa ([place]) 09/20/2017   Liver abscess 07/10/2016   Acute on chronic anemia 07/10/2016   Essential hypertension 05/19/2013   Osteoarthritis 05/29/2009   Psoriasis 01/13/2009   Coronary artery disease 09/15/2008   Diverticulosis of colon 08/27/2007   History of benign prostatic hyperplasia 08/27/2007  Hyperlipidemia 03/19/2007   Gastroesophageal reflux disease 03/19/2007   Past Surgical History:  Procedure Laterality Date   ABDOMINAL AORTIC ENDOVASCULAR STENT GRAFT N/A 03/03/2021   Procedure: ABDOMINAL AORTIC ENDOVASCULAR STENT GRAFT;  Surgeon: Waynetta Sandy, MD;  Location: [facility];  Service: Vascular;  Laterality: N/A;   ABDOMINAL  AORTOGRAM W/LOWER EXTREMITY N/A 04/10/2021   Procedure: ABDOMINAL AORTOGRAM W/LOWER EXTREMITY;  Surgeon: Waynetta Sandy, MD;  Location: [facility];  Service: Cardiovascular;  Laterality: N/A;   BACK SURGERY     CARDIAC CATHETERIZATION     CATARACT EXTRACTION W/ INTRAOCULAR LENS  IMPLANT, BILATERAL Bilateral 01/2012 - 02/2012   CORONARY ATHERECTOMY N/A 04/27/2020   Procedure: CORONARY ATHERECTOMY;  Surgeon: Sherren Mocha, MD;  Location: [facility];  Service: Cardiovascular;  Laterality: N/A;   CORONARY STENT INTERVENTION N/A 04/27/2020   Procedure: CORONARY STENT INTERVENTION;  Surgeon: Sherren Mocha, MD;  Location: [facility];  Service: Cardiovascular;  Laterality: N/A;   DIRECT LARYNGOSCOPY Left 10/09/2017   Procedure: DIRECT LARYNGOSCOPY WITH BOPSY;  Surgeon: Jodi Marble, MD;  Location: [facility];  Service: ENT;  Laterality: Left;   ESOPHAGOGASTRODUODENOSCOPY (EGD) WITH ESOPHAGEAL DILATION     "couple times" (07/19/2016)   ESOPHAGOSCOPY Left 10/09/2017   Procedure: ESOPHAGOSCOPY;  Surgeon: Jodi Marble, MD;  Location: [facility];  Service: ENT;  Laterality: Left;   INTRAVASCULAR IMAGING/OCT N/A 04/27/2020   Procedure: INTRAVASCULAR IMAGING/OCT;  Surgeon: Sherren Mocha, MD;  Location: [facility];  Service: Cardiovascular;  Laterality: N/A;   INTRAVASCULAR PRESSURE WIRE/FFR STUDY N/A 04/27/2020   Procedure: INTRAVASCULAR PRESSURE WIRE/FFR STUDY;  Surgeon: Sherren Mocha, MD;  Location: [facility];  Service: Cardiovascular;  Laterality: N/A;   IR GASTROSTOMY TUBE MOD SED  01/08/2018   IR THORACENTESIS ASP PLEURAL SPACE W/IMG GUIDE  07/19/2016   LAPAROSCOPIC CHOLECYSTECTOMY  1984   LUMBAR [place] SURGERY  05/1996   L4-5; Dr. Claudean Kinds LAMINECTOMY/DECOMPRESSION MICRODISCECTOMY  10/2002   L3-4. Dr. Sherwood Gambler   MULTIPLE TOOTH EXTRACTIONS  1980s   PARTIAL GLOSSECTOMY  12/02/2017   Dr. Nicolette Bang- [facility]    pharyngoplasty for closure of tingue base defect  12/02/2017   Dr. Nicolette Bang- [place]  07/15/2016   POSTERIOR LUMBAR FUSION  09/1996   Ray cage, L4-5 Dr. Rita Ohara   PROSTATE BIOPSY  ~ 2017   radical tonsillectomy Left 12/02/2017   Dr. Nicolette Bang at [facility] N/A 03/31/2020   Procedure: RIGHT HEART CATH AND CORONARY ANGIOGRAPHY;  Surgeon: Larey Dresser, MD;  Location: [facility];  Service: Cardiovascular;  Laterality: N/A;   RIGID BRONCHOSCOPY Left 10/09/2017   Procedure: RIGID BRONCHOSCOPY;  Surgeon: Jodi Marble, MD;  Location: [facility];  Service: ENT;  Laterality: Left;   TEE WITHOUT CARDIOVERSION N/A 06/07/2020   Procedure: TRANSESOPHAGEAL ECHOCARDIOGRAM (TEE);  Surgeon: Sherren Mocha, MD;  Location: [facility];  Service: Open Heart Surgery;  Laterality: N/A;   TONSILLECTOMY     TOTAL HIP ARTHROPLASTY Left 12/04/2020   Procedure: TOTAL HIP ARTHROPLASTY ANTERIOR APPROACH;  Surgeon: Rod Can, MD;  Location: [facility];  Service: Orthopedics;  Laterality: Left;   TOTAL HIP ARTHROPLASTY Left 11/2020   TRACHEOSTOMY  12/02/2017   Dr. Nicolette Bang- [facility]   ULTRASOUND GUIDANCE FOR VASCULAR ACCESS Right 06/07/2020   Procedure: ULTRASOUND GUIDANCE FOR VASCULAR ACCESS;  Surgeon: Sherren Mocha, MD;  Location: [facility];  Service: Open  Heart Surgery;  Laterality: Right;   ULTRASOUND GUIDANCE FOR VASCULAR ACCESS Bilateral 03/03/2021   Procedure: ULTRASOUND GUIDANCE FOR VASCULAR ACCESS, BILATERAL FEMORAL ARTERIES;  Surgeon: Waynetta Sandy, MD;  Location: [facility];  Service: Vascular;  Laterality: Bilateral;   VASCULAR SURGERY     Family History  Problem Relation Age of Onset   Heart disease Father 12       Living   Coronary artery disease Father        CABG   Alzheimer's disease Mother 68       Deceased   Arthritis Mother    Aneurysm Brother    Stomach cancer Maternal Uncle    Brain cancer Maternal Aunt         x2   Obesity Daughter        Had Bypass Sx   Outpatient Medications Prior to Visit  Medication Sig Dispense Refill   acetaminophen (TYLENOL) 500 MG tablet Take 1,000 mg by mouth every 8 (eight) hours as needed for mild pain.     albuterol (VENTOLIN HFA) 108 (90 Base) MCG/ACT inhaler INHALE 2 PUFFS INTO THE LUNGS EVERY 6 HOURS AS NEEDED FOR WHEEZING OR SHORTNESS OF BREATH 6.7 g 0   allopurinol (ZYLOPRIM) 100 MG tablet TAKE 2 TABLETS(200 MG) BY MOUTH DAILY 180 tablet 0   amLODipine (NORVASC) 10 MG tablet TAKE 1 TABLET(10 MG) BY MOUTH DAILY 90 tablet 3   aspirin EC 81 MG tablet Take 81 mg by mouth daily. Swallow whole.     atorvastatin (LIPITOR) 80 MG tablet Take 1 tablet (80 mg total) by mouth daily. 90 tablet 3   carvedilol (COREG) 12.5 MG tablet Take 1 tablet (12.5 mg total) by mouth 2 (two) times daily. 60 tablet 6   Cholecalciferol 25 MCG (1000 UT) tablet Take 1,000 Units by mouth daily.     ezetimibe (ZETIA) 10 MG tablet Take 1 tablet (10 mg total) by mouth daily. 30 tablet 5   FLUoxetine (PROZAC) 40 MG capsule Take 1 capsule (40 mg total) by mouth daily. 30 capsule 5   fluticasone (FLONASE) 50 MCG/ACT nasal spray SHAKE LIQUID AND USE 2 SPRAYS IN EACH NOSTRIL DAILY AS NEEDED FOR RHINITIS OR ALLERGIES 16 g 11   gabapentin (NEURONTIN) 400 MG capsule Take 1 capsule (400 mg total) by mouth 2 (two) times daily. For nerve pain 60 capsule 5   ipratropium (ATROVENT) 0.02 % nebulizer solution Use 1 vial (0.5 mg total) by nebulization every 6 (six) hours as needed for wheezing or shortness of breath. 75 mL 12   ipratropium (ATROVENT) 0.03 % nasal spray Place 2 sprays into both nostrils every 12 (twelve) hours. 30 mL 12   levalbuterol (XOPENEX) 0.63 MG/3ML nebulizer solution Use 1 vial (0.63 mg total) by nebulization every 6 (six) hours as needed for wheezing or shortness of breath. 75 mL 12   lisinopril (ZESTRIL) 20 MG tablet Take 20 mg by mouth daily.     Oxycodone HCl 10 MG TABS Take 1 tablet  (10 mg total) by mouth every 4 (four) hours as needed (max 5 tabs/day). 150 tablet 0   pantoprazole (PROTONIX) 40 MG tablet Take 1 tablet (40 mg total) by mouth daily. 30 tablet 2   potassium chloride (KLOR-CON) 10 MEQ tablet Take 2 tablets (20 mEq total) by mouth 2 (two) times daily. 120 tablet 11   predniSONE (DELTASONE) 5 MG tablet TAKE 1 TABLET(5 MG) BY MOUTH DAILY WITH BREAKFAST 90 tablet 0   tamsulosin (FLOMAX) 0.4 MG CAPS  capsule Take 0.4 mg by mouth in the morning and at bedtime.     Tiotropium Bromide Monohydrate (SPIRIVA RESPIMAT) 2.5 MCG/ACT AERS Inhale 2 puffs into the lungs daily. 4 g 6   traMADol (ULTRAM) 50 MG tablet Take 2 tablets (100 mg total) by mouth 2 (two) times daily. 120 tablet 5   trolamine salicylate (ASPERCREME) 10 % cream Apply 1 Application topically 2 (two) times daily as needed (arthritis pain).     furosemide (LASIX) 20 MG tablet Take 20 mg by mouth daily. (Patient not taking: Reported on 10/12/2021)     No facility-administered medications prior to visit.   Allergies  Allergen Reactions   Celebrex [Celecoxib] Hives and Itching   Tape Other (See Comments)    Tears skin. Prefers paper tape Please     Objective:   Today's Vitals   10/18/21 0912  BP: 118/60  Pulse: 83  Temp: 97.8 F (36.6 C)  TempSrc: Temporal  SpO2: 97%  Weight: 162 lb 12.8 oz (73.8 kg)  Height: '5\' 7"'$  (1.702 m)   Body mass index is 25.5 kg/m.   General: Well developed, well nourished. No acute distress. Lungs: Coarse respirations with some ronchi in the left base and diffuse wheezing.  CV: RRR without murmurs or rubs. Pulses 2+ bilaterally. Extremities: No edema noted. Psych: Alert and oriented. Normal mood and affect.  Health Maintenance Due  Topic Date Due   Zoster Vaccines- Shingrix (1 of 2) Never done   COLONOSCOPY (Pts 45-75yr Insurance coverage will need to be confirmed)  03/22/2020   INFLUENZA VACCINE  09/05/2021     Assessment & Plan:   1. Coronary artery disease  involving native coronary artery of native heart without angina pectoris Stable. Continue current meds and follow up with cardiology.  2. Essential hypertension Blood pressure is at goal. Continue amlodipine 10 mg daily and lisinopril 20 mg daily.  3. Chronic diastolic heart failure ([place] Compensated. It appears he is tolerating being off of his diuretic. Continue lisinopril and carvedilol  4. Chronic obstructive pulmonary disease with acute lower respiratory infection ([facility] Mr. LNepomucenohas been prone ot exacerbations of his chronic bronchitis. He had a pneumonia earlier this summer. I will treat him with a course of antibiotics and a short-term burst of his prednisone. Follow-up as needed.  - amoxicillin-clavulanate (AUGMENTIN) 875-125 MG tablet; Take 1 tablet by mouth 2 (two) times daily.  Dispense: 20 tablet; Refill: 0 - predniSONE (DELTASONE) 20 MG tablet; Take 1 tablet (20 mg total) by mouth daily with breakfast.  Dispense: 5 tablet; Refill: 0  5. 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HCC) Continue current slow prednisone taper under rheumatology.  Return in about 3 months (around 01/17/2022) for Reassessment.   SHaydee Salter MD

## 2021-10-19 ENCOUNTER — Other Ambulatory Visit: Payer: Self-pay | Admitting: Family Medicine

## 2021-10-19 ENCOUNTER — Telehealth: Payer: Self-pay | Admitting: *Deleted

## 2021-10-19 DIAGNOSIS — J449 Chronic obstructive pulmonary disease, unspecified: Secondary | ICD-10-CM

## 2021-10-19 MED ORDER — MORPHINE SULFATE ER 15 MG PO TBCR: 15.0000 mg | EXTENDED_RELEASE_TABLET | Freq: Two times a day (BID) | ORAL | 0 refills | Status: DC

## 2021-10-19 NOTE — Telephone Encounter (Signed)
  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  Per Dr Nelva Bush So will try adding MS Contin 15 mg BID for pain- and then can take Oxycodone AS NEEDED- if needs it- but not if doesn't need it.    Pt voiced understanding- will try to get prior auth for him asap since leaving to go to beach Saturday

## 2021-10-19 NOTE — Telephone Encounter (Signed)
Prior auth submitted for Morphine Sulfate ER 15 mg #60 to insurance via CoverMyMeds.

## 2021-10-19 NOTE — Telephone Encounter (Signed)
APPROVED Effective from 10/19/2021 through 10/20/2022.

## 2021-11-07 DIAGNOSIS — M5416 Radiculopathy, lumbar region: Secondary | ICD-10-CM | POA: Diagnosis not present

## 2021-11-08 ENCOUNTER — Other Ambulatory Visit (HOSPITAL_COMMUNITY): Payer: Self-pay | Admitting: Family Medicine

## 2021-11-08 ENCOUNTER — Other Ambulatory Visit (HOSPITAL_COMMUNITY): Payer: Self-pay

## 2021-11-08 ENCOUNTER — Other Ambulatory Visit (HOSPITAL_COMMUNITY): Payer: Self-pay | Admitting: Cardiology

## 2021-11-08 DIAGNOSIS — C108 Malignant neoplasm of overlapping sites of oropharynx: Secondary | ICD-10-CM | POA: Diagnosis not present

## 2021-11-08 MED ORDER — FUROSEMIDE 20 MG PO TABS: 20.0000 mg | ORAL_TABLET | Freq: Every day | ORAL | 3 refills | Status: DC

## 2021-11-14 ENCOUNTER — Telehealth: Payer: Self-pay

## 2021-11-14 NOTE — Telephone Encounter (Signed)
Called patient and left message letting him to call [facility] to make sure they have his medication in stock before we sent it over.

## 2021-11-14 NOTE — Telephone Encounter (Signed)
Patient has #5 Oxycodone on hand & #8 Morphine on hand. He stated he will be out of the Oxycodone today. Please send to to [facility] on [facility].    Filled  Written  ID  Drug  QTY  Days  Prescriber  RX #  Dispenser  Refill  Daily Dose*  Pymt Type  PMP  11/08/2021 10/06/2021 1  Tramadol Hcl 50 Mg Tablet 120.00 30 Me Lov [id number] [facility] ([id number]) 0/5 40.00 MME Medicare [place] 10/19/2021 10/19/2021 1  Morphine Sulf Er 15 Mg Tablet 60.00 30 Me Lov [id number] [facility] ([id number]) 0/0 30.00 MME Comm Ins [place] 10/04/2021 10/02/2021 1  Oxycodone Hcl (Ir) 10 Mg Tab 150.00 30 Me Lov [id number] [facility] ([id number]) 0/0 75.00 MME Medicare [place] 09/29/2021 06/02/2021 1  Tramadol Hcl 50 Mg Tablet 120.00 30 Me Lov [id number] [facility] ([id number]) 3/5 40.00 MME Medicare [place]

## 2021-11-15 ENCOUNTER — Telehealth: Payer: Self-pay | Admitting: Registered Nurse

## 2021-11-15 MED ORDER — OXYCODONE HCL 10 MG PO TABS
ORAL_TABLET | ORAL | 0 refills | Status: DC
Start: 2021-11-15 — End: 2021-12-08

## 2021-11-15 MED ORDER — MORPHINE SULFATE ER 15 MG PO TBCR: 15.0000 mg | EXTENDED_RELEASE_TABLET | Freq: Two times a day (BID) | ORAL | 0 refills | Status: DC

## 2021-11-15 NOTE — Telephone Encounter (Signed)
[name] note was reviewed and her telephone encounter from 09/ 14/2023. She prescribed Morphine. He is currently on Morphine, Oxycodone and Tramadol.  I asked Mr. Amparan how often is he taking his oxycodone he states 3- 4 times a day as needed for pain. He also reports he had his injection from Dr Nelva Bush with some relief noted. His oxycodone will be decreased to 4 times a day as needed for pain. He can speak with [name] regarding the above on Monday 11/20/2021, he verbalizes understanding.

## 2021-11-15 NOTE — Telephone Encounter (Signed)
Patient to call back with pharmacy location of who has it in stock.

## 2021-11-15 NOTE — Telephone Encounter (Signed)
Call placed to Mr. Mehlhoff, awaiting a return call. Need to know how many oxycodone he's taking a day.  [name] telephone and office note was reviewed.  PMP was Reviewed.  Awaiting a return call from Mr. Thayer.

## 2021-11-15 NOTE — Telephone Encounter (Signed)
Patient called back. Both medications are in stock on [facility].

## 2021-11-21 DIAGNOSIS — I5032 Chronic diastolic (congestive) heart failure: Secondary | ICD-10-CM | POA: Diagnosis not present

## 2021-11-21 DIAGNOSIS — M5416 Radiculopathy, lumbar region: Secondary | ICD-10-CM | POA: Diagnosis not present

## 2021-11-21 DIAGNOSIS — M5136 Other intervertebral disc degeneration, lumbar region: Secondary | ICD-10-CM | POA: Diagnosis not present

## 2021-11-21 DIAGNOSIS — J9620 Acute and chronic respiratory failure, unspecified whether with hypoxia or hypercapnia: Secondary | ICD-10-CM | POA: Diagnosis not present

## 2021-11-22 ENCOUNTER — Telehealth: Payer: Self-pay

## 2021-11-22 NOTE — Telephone Encounter (Signed)
Mr. Stewart will drop a copy of his most recent MRI on 11/23/2021. He wants to you to review it and discuss his pain. He also wants to discuss him pain medication.  Call back phone [phone].

## 2021-11-24 MED ORDER — GABAPENTIN 800 MG PO TABS: 800.0000 mg | ORAL_TABLET | Freq: Two times a day (BID) | ORAL | 5 refills | Status: DC

## 2021-11-24 NOTE — Telephone Encounter (Signed)
Saw NP at [facility]- MRI-      recurrent R foraminal disc protrusion with severe facet arthropathy and scar I=tissue with mod to severe R foraminal narrowing- L4/5 L5/S1- small disc bulge- severe facet arthropathy- compression/displacement of S1 nerve root.   L3/4 16m retrolithesis-  mod-severe foraminal narrowing B/L   Started MS Contin 1 month ago- hasn't noticed any significant improvement with MS Contin in last month.   When gets up and walks- gets pain shooting down legs radiating down legs.    On Gabapentin 400 mg BID  Will increase gabapentin to 800 mg 2x/day x 1 week, then go up to 3x/day for nerve pain- I think more of pt's pain is due to nerve pain not "traditional arthritis pain" and that's why MS Contin isn't working as well as expected.   Will call early for Oxycodone 10 mg- usually get 5-6 tabs/day, not 4 tabs/day. I will expect to hear early form him for pain meds.

## 2021-11-24 NOTE — Addendum Note (Signed)
Addended by: Courtney Heys on: 11/24/2021 12:59 PM   Modules accepted: Orders

## 2021-12-04 ENCOUNTER — Other Ambulatory Visit (HOSPITAL_COMMUNITY): Payer: Self-pay

## 2021-12-04 MED ORDER — EZETIMIBE 10 MG PO TABS: 10.0000 mg | ORAL_TABLET | Freq: Every day | ORAL | 5 refills | Status: DC

## 2021-12-05 ENCOUNTER — Encounter: Payer: Self-pay | Admitting: Family Medicine

## 2021-12-05 ENCOUNTER — Ambulatory Visit (INDEPENDENT_AMBULATORY_CARE_PROVIDER_SITE_OTHER): Payer: Medicare Other | Admitting: Family Medicine

## 2021-12-05 VITALS — BP 118/66 | HR 69 | Temp 97.4°F | Ht 67.0 in | Wt 170.0 lb

## 2021-12-05 DIAGNOSIS — H6992 Unspecified Eustachian tube disorder, left ear: Secondary | ICD-10-CM

## 2021-12-05 MED ORDER — PREDNISONE 20 MG PO TABS: 20.0000 mg | ORAL_TABLET | Freq: Two times a day (BID) | ORAL | 0 refills | Status: AC

## 2021-12-05 NOTE — Progress Notes (Signed)
Established Patient Office Visit  Subjective   Patient ID: [name], male    DOB: 1940/05/23  Age: 81 y.o. MRN: [id number]  Chief Complaint  Patient presents with   Ear Fullness    Left ear pressure, feels clogged up, lots of drainage, little cough.     Ear Fullness  Associated symptoms include hearing loss. Pertinent negatives include no abdominal pain, ear discharge or rash.   presents with a 5 to 6-day history of left ear congestion.  Patient has a history of ongoing postnasal drip status post surgical excision of tonsillar malignancy followed by radiation treatment 5 and half years ago.  He uses a nasal steroid consistently.  He denies fevers chills, ear pain facial pressure or teeth pain at this time.  There has been no rhinorrhea.      Review of Systems  Constitutional: Negative.   HENT:  Positive for hearing loss. Negative for ear discharge, ear pain and sinus pain.   Eyes:  Negative for blurred vision, discharge and redness.  Respiratory: Negative.    Cardiovascular: Negative.   Gastrointestinal:  Negative for abdominal pain.  Genitourinary: Negative.   Musculoskeletal: Negative.  Negative for myalgias.  Skin:  Negative for rash.  Neurological:  Negative for tingling, loss of consciousness and weakness.  Endo/Heme/Allergies:  Negative for polydipsia.      Objective:     BP 118/66 (BP Location: Right Arm, Patient Position: Sitting, Cuff Size: Normal)   Pulse 69   Temp (!) 97.4 F (36.3 C) (Temporal)   Ht '5\' 7"'$  (1.702 m)   Wt 170 lb (77.1 kg)   SpO2 93%   BMI 26.63 kg/m    Physical Exam Constitutional:      General: He is not in acute distress.    Appearance: Normal appearance. He is not ill-appearing, toxic-appearing or diaphoretic.  HENT:     Head: Normocephalic and atraumatic.     Right Ear: External ear normal. No middle ear effusion. Tympanic membrane is not erythematous.     Left Ear: External ear normal.  No middle ear effusion. Tympanic  membrane is retracted. Tympanic membrane is not erythematous (small cerumen in right canal.).     Mouth/Throat:     Mouth: Mucous membranes are moist.     Pharynx: Oropharynx is clear. No oropharyngeal exudate or posterior oropharyngeal erythema.  Eyes:     General: No scleral icterus.       Right eye: No discharge.        Left eye: No discharge.     Extraocular Movements: Extraocular movements intact.     Conjunctiva/sclera: Conjunctivae normal.     Pupils: Pupils are equal, round, and reactive to light.  Cardiovascular:     Rate and Rhythm: Normal rate and regular rhythm.  Pulmonary:     Effort: Pulmonary effort is normal. No respiratory distress.     Breath sounds: Normal breath sounds.  Musculoskeletal:     Cervical back: No rigidity or tenderness.  Skin:    General: Skin is warm and dry.  Neurological:     Mental Status: He is alert and oriented to person, place, and time.  Psychiatric:        Mood and Affect: Mood normal.        Behavior: Behavior normal.      No results found for any visits on 12/05/21.    The 10-year ASCVD risk score (Arnett DK, et al., 2019) is: 29.6%    Assessment & Plan:  Problem List Items Addressed This Visit       Nervous and Auditory   Dysfunction of left eustachian tube - Primary   Relevant Medications   predniSONE (DELTASONE) 20 MG tablet    Return if symptoms worsen or fail to improve.  Demonstrated eustachian tube dysfunction exercises.  He will do these 3-4 times daily.  Continue nasal steroid.  Brief prednisone burst 10 mg twice daily for 7 days.  Information on ETD was given.  May use Debrox for cerumen in right ear canal  Libby Maw, MD

## 2021-12-06 ENCOUNTER — Encounter: Payer: Self-pay | Admitting: Physical Medicine and Rehabilitation

## 2021-12-08 MED ORDER — MORPHINE SULFATE ER 15 MG PO TBCR
15.0000 mg | EXTENDED_RELEASE_TABLET | Freq: Two times a day (BID) | ORAL | 0 refills | Status: DC
Start: 2021-12-08 — End: 2022-01-25

## 2021-12-08 MED ORDER — GABAPENTIN 800 MG PO TABS: 800.0000 mg | ORAL_TABLET | Freq: Three times a day (TID) | ORAL | 5 refills | Status: DC

## 2021-12-08 MED ORDER — OXYCODONE HCL 10 MG PO TABS
ORAL_TABLET | ORAL | 0 refills | Status: DC
Start: 2021-12-08 — End: 2021-12-13

## 2021-12-12 ENCOUNTER — Telehealth: Payer: Self-pay

## 2021-12-13 ENCOUNTER — Encounter
Payer: Medicare Other | Attending: Physical Medicine and Rehabilitation | Admitting: Physical Medicine and Rehabilitation

## 2021-12-13 ENCOUNTER — Encounter: Payer: Self-pay | Admitting: Physical Medicine and Rehabilitation

## 2021-12-13 VITALS — BP 113/52 | HR 67 | Ht 67.0 in | Wt 168.0 lb

## 2021-12-13 DIAGNOSIS — Z79891 Long term (current) use of opiate analgesic: Secondary | ICD-10-CM | POA: Insufficient documentation

## 2021-12-13 DIAGNOSIS — Z5181 Encounter for therapeutic drug level monitoring: Secondary | ICD-10-CM | POA: Insufficient documentation

## 2021-12-13 DIAGNOSIS — G894 Chronic pain syndrome: Secondary | ICD-10-CM | POA: Insufficient documentation

## 2021-12-13 DIAGNOSIS — J441 Chronic obstructive pulmonary disease with (acute) exacerbation: Secondary | ICD-10-CM | POA: Insufficient documentation

## 2021-12-13 MED ORDER — OXYCODONE HCL 10 MG PO TABS: 10.0000 mg | ORAL_TABLET | ORAL | 0 refills | Status: DC | PRN

## 2021-12-13 MED ORDER — OXYCODONE HCL 10 MG PO TABS
10.0000 mg | ORAL_TABLET | ORAL | 0 refills | Status: DC | PRN
Start: 2021-12-13 — End: 2021-12-13

## 2021-12-13 NOTE — Progress Notes (Signed)
Subjective:    Patient ID: Brandon [name], male    DOB: May 24, 1940, 81 y.o.   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 Also has AAA-done in last year- ; hx of aortic / heart valve replacement in last year 2022/2023. 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 S/p L THR- 12/02/20  Getting AAA as of 1/23.   Before the shot, couldn't walk very far- got Lumbar ESI 2 months ago- has another 6 weeks until can get another one.  Dr Nelva Bush in doing injections.  To look for different spot this next time.   Still having a lot of pain-  Wife frustrated because not walking as far as she thinks he should.    Cannot pick up pain meds until tomorrow- due to 4x/day Rx.   Tramadol is working well for Restless leg syndrome- almost none anymore.  TO be honest, doesn't difference with MS Contin- might extend pain control a little during day.   Gabapentin increase has made a difference in pain.    Pain Inventory Average Pain 6 Pain Right Now 6 My pain is constant  In the last 24 hours, has pain interfered with the following? General activity 7 Relation with others 4 Enjoyment of life 4 What TIME of day is your pain at its worst? morning  and daytime Sleep (in general) Poor  Pain is worse with: walking, bending, and standing Pain improves with: rest, medication, and injections Relief from Meds:  not answered  Family History  Problem Relation Age of Onset   Heart disease Father 63       Living   Coronary artery disease Father        CABG   Alzheimer's disease Mother 84       Deceased   Arthritis Mother    Aneurysm Brother    Stomach cancer Maternal Uncle    Brain cancer Maternal Aunt        x2   Obesity  Daughter        Had Bypass Sx   Social History   Socioeconomic History   Marital status: Married    Spouse name: etta   Number of children: 2   Years of education: Not on file   Highest education level: Not on file  Occupational History   Occupation: retired    Fish farm manager: RETIRED    Comment: disabled due to back problems  Tobacco Use   Smoking status: Former    Packs/day: 1.50    Years: 50.00    Total pack years: 75.00    Types: Cigarettes    Quit date: 09/05/2017    Years since quitting: 4.2    Passive exposure: Never   Smokeless tobacco: Never   Tobacco comments:    smoked less than 1 ppd for 40+ years;   Vaping Use   Vaping Use: Never used  Substance and Sexual Activity   Alcohol use: Yes    Comment: occ   Drug use: Yes    Types: Oxycodone    Comment: has prescription   Sexual activity: Not Currently  Other Topics Concern   Not on file  Social History Narrative   ** Merged History Encounter **       Married (3rd), [place]. 2 children from 1st marriage, 4 step children.    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       Cell # [phone]   Social Determinants of Health   Financial Resource Strain: Low Risk  (06/07/2021)   Overall Financial Resource Strain (CARDIA)    Difficulty of Paying Living Expenses: Not hard at all  Food Insecurity: No Food Insecurity (06/07/2021)   Hunger Vital Sign    Worried About Running Out of Food in the Last Year: Never true    [place] in the Last Year: Never true  Transportation Needs: No Transportation Needs (06/07/2021)   PRAPARE - Hydrologist (Medical): No    Lack of Transportation (Non-Medical): No  Physical Activity: Inactive (06/07/2021)   Exercise Vital Sign    Days of Exercise per Week: 0 days    Minutes of Exercise per Session: 0 min  Stress: No Stress Concern Present (06/07/2021)   [place]    Feeling of Stress : Not at all  Social Connections: Moderately Integrated (06/07/2021)   Social Connection and Isolation Panel [NHANES]    Frequency of Communication with Friends and Family: Twice a week    Frequency of Social Gatherings with Friends and Family: Twice a week    Attends Religious Services: More than 4 times per year    Active Member of [employer] or Organizations: No    Attends Archivist Meetings: Never    Marital Status: Married   Past Surgical History:  Procedure Laterality Date   ABDOMINAL AORTIC ENDOVASCULAR STENT GRAFT N/A 03/03/2021   Procedure: ABDOMINAL AORTIC ENDOVASCULAR STENT GRAFT;  Surgeon: Waynetta Sandy, MD;  Location: [place];  Service: Vascular;  Laterality: N/A;   ABDOMINAL AORTOGRAM W/LOWER EXTREMITY N/A 04/10/2021   Procedure: ABDOMINAL AORTOGRAM W/LOWER EXTREMITY;  Surgeon: Waynetta Sandy, MD;  Location: [place] [facility];  Service: Cardiovascular;  Laterality: N/A;   BACK SURGERY     CARDIAC CATHETERIZATION     CATARACT EXTRACTION W/ INTRAOCULAR LENS  IMPLANT, BILATERAL Bilateral 01/2012 - 02/2012   CORONARY ATHERECTOMY N/A 04/27/2020   Procedure: CORONARY ATHERECTOMY;  Surgeon: Sherren Mocha, MD;  Location: [place] [facility];  Service: Cardiovascular;  Laterality: N/A;   CORONARY STENT INTERVENTION N/A 04/27/2020   Procedure: CORONARY STENT INTERVENTION;  Surgeon: Sherren Mocha, MD;  Location: [place] [facility];  Service: Cardiovascular;  Laterality: N/A;   DIRECT LARYNGOSCOPY Left 10/09/2017   Procedure: DIRECT LARYNGOSCOPY WITH BOPSY;  Surgeon: Jodi Marble, MD;  Location: [place];  Service: ENT;  Laterality: Left;   ESOPHAGOGASTRODUODENOSCOPY (EGD) WITH ESOPHAGEAL DILATION     "couple times" (07/19/2016)   ESOPHAGOSCOPY Left 10/09/2017   Procedure: ESOPHAGOSCOPY;  Surgeon: Jodi Marble, MD;  Location: [place];  Service:  ENT;  Laterality: Left;   INTRAVASCULAR IMAGING/OCT N/A 04/27/2020   Procedure: INTRAVASCULAR IMAGING/OCT;  Surgeon: Sherren Mocha, MD;  Location: [place] [facility];  Service: Cardiovascular;  Laterality: N/A;   INTRAVASCULAR PRESSURE WIRE/FFR STUDY N/A 04/27/2020   Procedure: INTRAVASCULAR PRESSURE WIRE/FFR STUDY;  Surgeon: Sherren Mocha, MD;  Location: [place] [facility];  Service: Cardiovascular;  Laterality: N/A;   IR GASTROSTOMY TUBE MOD SED  01/08/2018  IR THORACENTESIS ASP PLEURAL SPACE W/IMG GUIDE  07/19/2016   LAPAROSCOPIC CHOLECYSTECTOMY  1984   LUMBAR DISC SURGERY  05/1996   L4-5; Dr. Sherwood Gambler    LUMBAR LAMINECTOMY/DECOMPRESSION MICRODISCECTOMY  10/2002   L3-4. Dr. Sherwood Gambler   MULTIPLE TOOTH EXTRACTIONS  1980s   PARTIAL GLOSSECTOMY  12/02/2017   Dr. [facility]- [facility]   pharyngoplasty for closure of tingue base defect  12/02/2017   Dr. Nicolette Bang- [place]  07/15/2016   POSTERIOR LUMBAR FUSION  09/1996   Ray cage, L4-5 Dr. Rita Ohara   PROSTATE BIOPSY  ~ 2017   radical tonsillectomy Left 12/02/2017   Dr. Nicolette Bang at [place] N/A 03/31/2020   Procedure: RIGHT HEART CATH AND CORONARY ANGIOGRAPHY;  Surgeon: Larey Dresser, MD;  Location: [place] [facility];  Service: Cardiovascular;  Laterality: N/A;   RIGID BRONCHOSCOPY Left 10/09/2017   Procedure: RIGID BRONCHOSCOPY;  Surgeon: Jodi Marble, MD;  Location: [place];  Service: ENT;  Laterality: Left;   TEE WITHOUT CARDIOVERSION N/A 06/07/2020   Procedure: TRANSESOPHAGEAL ECHOCARDIOGRAM (TEE);  Surgeon: Sherren Mocha, MD;  Location: [place];  Service: Open Heart Surgery;  Laterality: N/A;   TONSILLECTOMY     TOTAL HIP ARTHROPLASTY Left 12/04/2020   Procedure: TOTAL HIP ARTHROPLASTY ANTERIOR APPROACH;  Surgeon: Rod Can, MD;  [facility];  Service: Orthopedics;  Laterality: Left;   TOTAL HIP ARTHROPLASTY Left 11/2020   TRACHEOSTOMY   12/02/2017   Dr. Nicolette Bang- [facility]   ULTRASOUND GUIDANCE FOR VASCULAR ACCESS Right 06/07/2020   Procedure: ULTRASOUND GUIDANCE FOR VASCULAR ACCESS;  Surgeon: Sherren Mocha, MD;  Location: [place];  Service: Open Heart Surgery;  Laterality: Right;   ULTRASOUND GUIDANCE FOR VASCULAR ACCESS Bilateral 03/03/2021   Procedure: ULTRASOUND GUIDANCE FOR VASCULAR ACCESS, BILATERAL FEMORAL ARTERIES;  Surgeon: Waynetta Sandy, MD;  Location: [place];  Service: Vascular;  Laterality: Bilateral;   VASCULAR SURGERY     Past Surgical History:  Procedure Laterality Date   ABDOMINAL AORTIC ENDOVASCULAR STENT GRAFT N/A 03/03/2021   Procedure: ABDOMINAL AORTIC ENDOVASCULAR STENT GRAFT;  Surgeon: Waynetta Sandy, MD;  Location: [place];  Service: Vascular;  Laterality: N/A;   ABDOMINAL AORTOGRAM W/LOWER EXTREMITY N/A 04/10/2021   Procedure: ABDOMINAL AORTOGRAM W/LOWER EXTREMITY;  Surgeon: Waynetta Sandy, MD;  Location: [place] [facility];  Service: Cardiovascular;  Laterality: N/A;   BACK SURGERY     CARDIAC CATHETERIZATION     CATARACT EXTRACTION W/ INTRAOCULAR LENS  IMPLANT, BILATERAL Bilateral 01/2012 - 02/2012   CORONARY ATHERECTOMY N/A 04/27/2020   Procedure: CORONARY ATHERECTOMY;  Surgeon: Sherren Mocha, MD;  Location: [place] [facility];  Service: Cardiovascular;  Laterality: N/A;   CORONARY STENT INTERVENTION N/A 04/27/2020   Procedure: CORONARY STENT INTERVENTION;  Surgeon: Sherren Mocha, MD;  Location: [place] [facility];  Service: Cardiovascular;  Laterality: N/A;   DIRECT LARYNGOSCOPY Left 10/09/2017   Procedure: DIRECT LARYNGOSCOPY WITH BOPSY;  Surgeon: Jodi Marble, MD;  Location: [place];  Service: ENT;  Laterality: Left;   ESOPHAGOGASTRODUODENOSCOPY (EGD) WITH ESOPHAGEAL DILATION     "couple times" (07/19/2016)   ESOPHAGOSCOPY Left 10/09/2017   Procedure: ESOPHAGOSCOPY;  Surgeon: Jodi Marble, MD;  Location: [place];  Service: ENT;   Laterality: Left;   INTRAVASCULAR IMAGING/OCT N/A 04/27/2020   Procedure: INTRAVASCULAR IMAGING/OCT;  Surgeon: Sherren Mocha, MD;  Location: [place] [facility];  Service: Cardiovascular;  Laterality: N/A;   INTRAVASCULAR PRESSURE  WIRE/FFR STUDY N/A 04/27/2020   Procedure: INTRAVASCULAR PRESSURE WIRE/FFR STUDY;  Surgeon: Sherren Mocha, MD;  Location: [place] [facility];  Service: Cardiovascular;  Laterality: N/A;   IR GASTROSTOMY TUBE MOD SED  01/08/2018   IR THORACENTESIS ASP PLEURAL SPACE W/IMG GUIDE  07/19/2016   LAPAROSCOPIC CHOLECYSTECTOMY  1984   LUMBAR [place] SURGERY  05/1996   L4-5; Dr. Claudean Kinds LAMINECTOMY/DECOMPRESSION MICRODISCECTOMY  10/2002   L3-4. Dr. Sherwood Gambler   MULTIPLE TOOTH EXTRACTIONS  1980s   PARTIAL GLOSSECTOMY  12/02/2017   [facility]   pharyngoplasty for closure of tingue base defect  12/02/2017   Dr. Nicolette Bang- [place]  07/15/2016   POSTERIOR LUMBAR FUSION  09/1996   Ray cage, L4-5 Dr. Rita Ohara   PROSTATE BIOPSY  ~ 2017   radical tonsillectomy Left 12/02/2017   Dr. Nicolette Bang at [place] N/A 03/31/2020   Procedure: RIGHT HEART CATH AND CORONARY ANGIOGRAPHY;  Surgeon: Larey Dresser, MD;  Location: [place] [facility];  Service: Cardiovascular;  Laterality: N/A;   RIGID BRONCHOSCOPY Left 10/09/2017   Procedure: RIGID BRONCHOSCOPY;  Surgeon: Jodi Marble, MD;  Location: [place];  Service: ENT;  Laterality: Left;   TEE WITHOUT CARDIOVERSION N/A 06/07/2020   Procedure: TRANSESOPHAGEAL ECHOCARDIOGRAM (TEE);  Surgeon: Sherren Mocha, MD;  Location: [place];  Service: Open Heart Surgery;  Laterality: N/A;   TONSILLECTOMY     TOTAL HIP ARTHROPLASTY Left 12/04/2020   Procedure: TOTAL HIP ARTHROPLASTY ANTERIOR APPROACH;  Surgeon: Rod Can, MD;  [facility];  Service: Orthopedics;  Laterality: Left;   TOTAL HIP ARTHROPLASTY Left 11/2020   TRACHEOSTOMY  12/02/2017    Dr. [facility]- [facility]   ULTRASOUND GUIDANCE FOR VASCULAR ACCESS Right 06/07/2020   Procedure: ULTRASOUND GUIDANCE FOR VASCULAR ACCESS;  Surgeon: Sherren Mocha, MD;  Location: [place];  Service: Open Heart Surgery;  Laterality: Right;   ULTRASOUND GUIDANCE FOR VASCULAR ACCESS Bilateral 03/03/2021   Procedure: ULTRASOUND GUIDANCE FOR VASCULAR ACCESS, BILATERAL FEMORAL ARTERIES;  Surgeon: Waynetta Sandy, MD;  Location: [place];  Service: Vascular;  Laterality: Bilateral;   VASCULAR SURGERY     Past Medical History:  Diagnosis Date   Abdominal aneurysm ([place])    Arthritis    "all over" (07/19/2016)   BPH (benign prostatic hypertrophy)    CAD (coronary artery disease)    Chicken pox    Chronic lower back pain    s/p surgical fusion   Depression    Diverticulosis    Esophageal stricture    GERD (gastroesophageal reflux disease)    Hepatitis B 1984   Hiatal hernia    History of radiation therapy 01/16/18- 03/05/18   Left Tonsil, 66 Gy in 33 fractions to high risk nodal echelons.    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 Burt Knack and Dr Cyndia Bent    Severe aortic stenosis    TIA (transient ischemic attack) 1990s   hx   tonsillar ca dx'd 11/2017   Tubular adenoma of colon 2009   BP (!) 113/52   Pulse 67   Ht '5\' 7"'$  (1.702 m)   Wt 168 lb (76.2 kg)   SpO2 94%   BMI 26.31 kg/m   Opioid Risk Score:  Fall Risk Score:  `1  Depression screen PHQ 2/9     12/13/2021   10:14 AM 12/05/2021    2:10 PM 10/18/2021    9:19 AM 10/06/2021    1:11 PM 06/07/2021    4:02 PM 06/07/2021    4:00 PM 06/02/2021   11:03 AM  Depression screen PHQ 2/9  Decreased Interest 0 0 0 0 0 0 0  Down, Depressed, Hopeless 0 0 1 0 0 0 0  PHQ - 2 Score 0 0 1 0 0 0 0  Altered sleeping   3      Tired, decreased energy   3       Change in appetite   1      Feeling bad or failure about yourself    0      Trouble concentrating   0      Moving slowly or fidgety/restless   0      Suicidal thoughts   0      PHQ-9 Score   8      Difficult doing work/chores  Not difficult at all Somewhat difficult         Review of Systems  Constitutional: Negative.   HENT: Negative.    Eyes: Negative.   Respiratory: Negative.    Cardiovascular: Negative.   Gastrointestinal: Negative.   Endocrine: Negative.   Genitourinary: Negative.   Musculoskeletal:  Positive for back pain.  Skin: Negative.   Allergic/Immunologic: Negative.   Neurological: Negative.   Hematological: Negative.   Psychiatric/Behavioral: Negative.    All other systems reviewed and are negative.      Objective:   Physical Exam  Awake, alert, appropriate, no assistive device, NAD Audible wheezing at rest Hoarse voice Wheezing more on R than L- decreased air movement on R compared to L. Prolonged expiration as well. And a little cough.       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 S/p L THR- 12/02/20  Getting AAA as of 1/23.   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  Will change Oxycodone 10 mg to q4 hours prn #150- and sent in new Rx.   3. Con't Tramadol for restless leg syndrome- is much improved on this dose.    4.  Try to take MS Contin- long acting morphine 1x/day - maybe at night and see if it makes a difference- let me know how it works for you-  and if need to continue.  If really not working, after 1 week, can stop it.    5. Con't gabapentin- 800 mg 3x/day- is working- will con't -   6. Has inhalers at home for wheezing- needs to take rescue inhalers more right now  7. UDS today for f/u on chronic pain per clinic policy  8. F/U in 3 months-f/u on chronic pain. Also sent in refill for 01/10/22 as well.   9. Suggest flutter  valve- can get on [employer]- can help break up phlegm in chest.    I spent a total of  32  minutes on total care today- >50% coordination of care- due to discussion of UDS, wheezing, pain meds changes; and flutter valve

## 2021-12-13 NOTE — Patient Instructions (Signed)
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 S/p L THR- 12/02/20  Getting AAA as of 1/23.   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  Will change Oxycodone 10 mg to q4 hours prn #150- and sent in new Rx.   3. Con't Tramadol for restless leg syndrome- is much improved on this dose.    4.  Try to take MS Contin- long acting morphine 1x/day - maybe at night and see if it makes a difference- let me know how it works for you- and if need to continue.  If really not working, after 1 week, can stop it.    5. Con't gabapentin- 800 mg 3x/day- is working- will con't -   6. Has inhalers at home for wheezing- needs to take rescue inhalers more right now  7. UDS today for f/u on chronic pain per clinic poolicy  8. F/U in 3 months-f/u on chronic pain.   9. Suggest flutter valve- can get on [employer]- can help break up phlegm in chest.

## 2021-12-14 ENCOUNTER — Telehealth: Payer: Self-pay

## 2021-12-18 LAB — TOXASSURE SELECT,+ANTIDEPR,UR

## 2021-12-19 ENCOUNTER — Telehealth: Payer: Self-pay | Admitting: *Deleted

## 2021-12-19 NOTE — Telephone Encounter (Signed)
Mr Julia is requesting refill on oxycodone and morphine.  Please see previous phone message from [place].

## 2021-12-20 ENCOUNTER — Telehealth: Payer: Self-pay | Admitting: *Deleted

## 2021-12-20 NOTE — Telephone Encounter (Signed)
Urine drug screen for this encounter is consistent for prescribed medication 

## 2021-12-21 ENCOUNTER — Ambulatory Visit (INDEPENDENT_AMBULATORY_CARE_PROVIDER_SITE_OTHER): Payer: Medicare Other

## 2021-12-21 ENCOUNTER — Telehealth: Payer: Self-pay | Admitting: Physical Medicine and Rehabilitation

## 2021-12-21 ENCOUNTER — Other Ambulatory Visit: Payer: Self-pay | Admitting: Family Medicine

## 2021-12-21 ENCOUNTER — Encounter: Payer: Self-pay | Admitting: Family Medicine

## 2021-12-21 ENCOUNTER — Telehealth: Payer: Self-pay

## 2021-12-21 ENCOUNTER — Ambulatory Visit (INDEPENDENT_AMBULATORY_CARE_PROVIDER_SITE_OTHER): Payer: Medicare Other | Admitting: Family Medicine

## 2021-12-21 VITALS — BP 118/78 | HR 72 | Temp 97.5°F | Wt 169.0 lb

## 2021-12-21 DIAGNOSIS — M19041 Primary osteoarthritis, right hand: Secondary | ICD-10-CM | POA: Diagnosis not present

## 2021-12-21 DIAGNOSIS — G8929 Other chronic pain: Secondary | ICD-10-CM

## 2021-12-21 DIAGNOSIS — M25552 Pain in left hip: Secondary | ICD-10-CM

## 2021-12-21 DIAGNOSIS — J060 Acute laryngopharyngitis: Secondary | ICD-10-CM | POA: Diagnosis not present

## 2021-12-21 DIAGNOSIS — M79641 Pain in right hand: Secondary | ICD-10-CM

## 2021-12-21 DIAGNOSIS — S62396A Other fracture of fifth metacarpal bone, right hand, initial encounter for closed fracture: Secondary | ICD-10-CM

## 2021-12-21 DIAGNOSIS — M545 Low back pain, unspecified: Secondary | ICD-10-CM | POA: Diagnosis not present

## 2021-12-21 DIAGNOSIS — S62616A Displaced fracture of proximal phalanx of right little finger, initial encounter for closed fracture: Secondary | ICD-10-CM | POA: Diagnosis not present

## 2021-12-21 DIAGNOSIS — J309 Allergic rhinitis, unspecified: Secondary | ICD-10-CM

## 2021-12-21 LAB — POCT RAPID STREP A (OFFICE): Rapid Strep A Screen: NEGATIVE

## 2021-12-21 MED ORDER — METHYLPREDNISOLONE SODIUM SUCC 40 MG IJ SOLR
40.0000 mg | Freq: Once | INTRAMUSCULAR | Status: AC
  Administered 2021-12-21: 40 mg via INTRAMUSCULAR

## 2021-12-21 NOTE — Telephone Encounter (Signed)
Patient would like a refill on morphine. Per patient he has 116 oxycodone's and zero morphine. He broke his hand couple days ago and will be needing a cast.

## 2021-12-21 NOTE — Progress Notes (Signed)
Assessment/Plan:   Problem List Items Addressed This Visit       Respiratory   Chronic allergic rhinitis   Other Visit Diagnoses     Chronic left hip pain    -  Primary   Relevant Medications   methylPREDNISolone sodium succinate (SOLU-MEDROL) 40 mg/mL injection 40 mg (Completed)   Other Relevant Orders   DG Lumbar Spine Complete   Right hand pain       Relevant Orders   DG Hand Complete Right   Ambulatory referral to Orthopedics   Sore throat and laryngitis       Relevant Medications   methylPREDNISolone sodium succinate (SOLU-MEDROL) 40 mg/mL injection 40 mg (Completed)   Other Relevant Orders   POCT rapid strep A (Completed)   Chronic bilateral low back pain, unspecified whether sciatica present       Relevant Medications   methylPREDNISolone sodium succinate (SOLU-MEDROL) 40 mg/mL injection 40 mg (Completed)   Other fracture of fifth metacarpal bone, right hand, initial encounter for closed fracture       Relevant Orders   Ambulatory referral to Orthopedics       Chronic pain, worse over the past few days.  Likely flare of her arthritis.  Currently on oxycodone for pain.  Will treat with injection of methylprednisone.  Mildly worsening sore throat in the setting of chronic throat pain and soft swallowing difficulties.  Offered COVID testing, however patient declined.  Strep negative, methylprednisone may provide some relief.  Patient is current indicated to get NSAIDs due to multiple comorbidities including anemia, heart disease, AAA, hypertension  Acute right hand pain.  Concern for nondisplaced fifth metacarpal fracture as evidenced on x-ray.  Urgent referral to orthopedics.  Patient advised to also reach out to his orthopedist to be urgently assessed.   Subjective:  HPI:  Brandon [name] is a 81 y.o. 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[place]); Reactive depression; TIA (transient ischemic attack); Esophageal stricture; History of transcatheter aortic valve replacement (TAVR); Chronic obstructive pulmonary disease ([place]); History of placement of stent in LAD coronary artery; Steatosis of liver; Portal vein thrombosis; Hepatitis B core antibody positive; Benign prostatic hyperplasia; Chronic diastolic heart failure ([place]); Acute respiratory failure with hypoxia ([place]); Elevated troponin; Demand ischemia; Chronic allergic rhinitis; History of hip fracture; S/P abdominal aortic aneurysm repair; Lumbar postlaminectomy syndrome; Degeneration of lumbar intervertebral disc; Lumbar spondylosis; Hyponatremia; Hyperkalemia; Acute metabolic encephalopathy; Myoclonic jerking; AKI (acute kidney injury) ([place]); Hypotension; Mixed anxiety and depressive disorder; Oropharyngeal dysphagia; Lumbar radiculopathy; and Dysfunction of left eustachian tube on their problem list..   He  has a past medical history of Abdominal aneurysm ([place]), Arthritis, BPH (benign prostatic hypertrophy), CAD (coronary artery disease), Chicken pox, Chronic lower back pain, Depression, Diverticulosis, Esophageal stricture, GERD (gastroesophageal reflux disease), Hepatitis B (1984), Hiatal hernia, History of radiation therapy (01/16/18- 03/05/18), HLD (hyperlipidemia), HTN (hypertension), Liver abscess (07/10/2016), Osteoarthritis, S/P TAVR (transcatheter aortic valve replacement) (06/07/2020), Severe aortic stenosis, TIA (transient ischemic attack) (1990s), tonsillar ca (dx'd 11/2017), and Tubular adenoma of colon (2009).Marland Kitchen   He  presents with chief complaint of Voice concerns (Voice has been weak and squeaky x 5  days. Pulled muscle on left hip x 4 days ago.( Hip replacement x 1 year ago). Door slammed on right hand x 1 week ago. ) .    Patient with chronic left hip pain.  States that a few days ago he was bending over and he felt pain in his left hip.  Patient takes oxycodone for chronic pain.  Says the pain is mild to moderate and the oxycodone helps some but not completely.  States that he is able to ambulate without difficulty.  Acute on chronic sore throat.  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  He has chronic swallowing issues and is followed with speech therapy.  Also has a history of chronic aspiration that has led to pneumonia in the past.  Reports that his throat has been mildly sore over the past few days and has had a dry cough.  Denies any fevers or chills or trouble breathing.  Right hand pain.  Patient presents with 5 days of right hand pain.  States that it was accidentally shut in a door.  The pain is not worsening, but is not controlled with oxycodone  Past Surgical History:  Procedure Laterality Date   ABDOMINAL AORTIC ENDOVASCULAR STENT GRAFT N/A 03/03/2021   Procedure: ABDOMINAL AORTIC ENDOVASCULAR STENT GRAFT;  Surgeon: Waynetta Sandy, MD;  Location: [place];  Service: Vascular;  Laterality: N/A;   ABDOMINAL AORTOGRAM W/LOWER EXTREMITY N/A 04/10/2021   Procedure: ABDOMINAL AORTOGRAM W/LOWER EXTREMITY;  Surgeon: Waynetta Sandy, MD;  Location: [facility];  Service: Cardiovascular;  Laterality: N/A;   BACK SURGERY     CARDIAC CATHETERIZATION     CATARACT EXTRACTION W/ INTRAOCULAR LENS  IMPLANT, BILATERAL Bilateral 01/2012 - 02/2012   CORONARY ATHERECTOMY N/A 04/27/2020   Procedure: CORONARY ATHERECTOMY;  Surgeon: Sherren Mocha, MD;  Location: [facility];  Service: Cardiovascular;  Laterality: N/A;   CORONARY STENT  INTERVENTION N/A 04/27/2020   Procedure: CORONARY STENT INTERVENTION;  Surgeon: Sherren Mocha, MD;  Location: [facility];  Service: Cardiovascular;  Laterality: N/A;   DIRECT LARYNGOSCOPY Left 10/09/2017   Procedure: DIRECT LARYNGOSCOPY WITH BOPSY;  Surgeon: Jodi Marble, MD;  Location: [place];  Service: ENT;  Laterality: Left;   ESOPHAGOGASTRODUODENOSCOPY (EGD) WITH ESOPHAGEAL DILATION     "couple times" (07/19/2016)   ESOPHAGOSCOPY Left 10/09/2017   Procedure: ESOPHAGOSCOPY;  Surgeon: Jodi Marble, MD;  Location: [facility];  Service: ENT;  Laterality: Left;   INTRAVASCULAR IMAGING/OCT N/A 04/27/2020   Procedure: INTRAVASCULAR IMAGING/OCT;  Surgeon: Sherren Mocha, MD;  Location: [facility];  Service: Cardiovascular;  Laterality: N/A;   INTRAVASCULAR PRESSURE WIRE/FFR STUDY N/A 04/27/2020   Procedure: INTRAVASCULAR PRESSURE WIRE/FFR STUDY;  Surgeon: Sherren Mocha, MD;  Location: [facility];  Service: Cardiovascular;  Laterality: N/A;   IR GASTROSTOMY TUBE MOD SED  01/08/2018   IR THORACENTESIS ASP PLEURAL SPACE W/IMG GUIDE  07/19/2016   LAPAROSCOPIC CHOLECYSTECTOMY  1984   LUMBAR [place] SURGERY  05/1996   L4-5; Dr. Claudean Kinds LAMINECTOMY/DECOMPRESSION MICRODISCECTOMY  10/2002   L3-4. Dr. Sherwood Gambler   MULTIPLE TOOTH EXTRACTIONS  1980s   PARTIAL GLOSSECTOMY  12/02/2017   Dr. Nicolette Bang- [facility]   pharyngoplasty for closure of tingue base defect  12/02/2017   Dr. Nicolette Bang- [place]  07/15/2016   POSTERIOR LUMBAR FUSION  09/1996   Ray cage, L4-5 Dr. Rita Ohara   PROSTATE BIOPSY  ~  2017   radical tonsillectomy Left 12/02/2017   Dr. Nicolette Bang at [place] N/A 03/31/2020   Procedure: RIGHT HEART CATH AND CORONARY ANGIOGRAPHY;  Surgeon: Larey Dresser, MD;  Location: [facility] [facility];  Service: Cardiovascular;  Laterality: N/A;   RIGID BRONCHOSCOPY Left 10/09/2017    Procedure: RIGID BRONCHOSCOPY;  Surgeon: Jodi Marble, MD;  Location: [place];  Service: ENT;  Laterality: Left;   TEE WITHOUT CARDIOVERSION N/A 06/07/2020   Procedure: TRANSESOPHAGEAL ECHOCARDIOGRAM (TEE);  Surgeon: Sherren Mocha, MD;  Location: [facility];  Service: Open Heart Surgery;  Laterality: N/A;   TONSILLECTOMY     TOTAL HIP ARTHROPLASTY Left 12/04/2020   Procedure: TOTAL HIP ARTHROPLASTY ANTERIOR APPROACH;  Surgeon: Rod Can, MD;  Location: [facility];  Service: Orthopedics;  Laterality: Left;   TOTAL HIP ARTHROPLASTY Left 11/2020   TRACHEOSTOMY  12/02/2017   Dr. Nicolette Bang- [facility]   ULTRASOUND GUIDANCE FOR VASCULAR ACCESS Right 06/07/2020   Procedure: ULTRASOUND GUIDANCE FOR VASCULAR ACCESS;  Surgeon: Sherren Mocha, MD;  Location: [place];  Service: Open Heart Surgery;  Laterality: Right;   ULTRASOUND GUIDANCE FOR VASCULAR ACCESS Bilateral 03/03/2021   Procedure: ULTRASOUND GUIDANCE FOR VASCULAR ACCESS, BILATERAL FEMORAL ARTERIES;  Surgeon: Waynetta Sandy, MD;  Location: [place];  Service: Vascular;  Laterality: Bilateral;   VASCULAR SURGERY      Outpatient Medications Prior to Visit  Medication Sig Dispense Refill   acetaminophen (TYLENOL) 500 MG tablet Take 1,000 mg by mouth every 8 (eight) hours as needed for mild pain.     albuterol (VENTOLIN HFA) 108 (90 Base) MCG/ACT inhaler INHALE 2 PUFFS INTO THE LUNGS EVERY 6 HOURS AS NEEDED FOR WHEEZING OR SHORTNESS OF BREATH 6.7 g 2   allopurinol (ZYLOPRIM) 100 MG tablet TAKE 2 TABLETS(200 MG) BY MOUTH DAILY 180 tablet 0   amLODipine (NORVASC) 10 MG tablet TAKE 1 TABLET(10 MG) BY MOUTH DAILY 90 tablet 3   aspirin EC 81 MG tablet Take 81 mg by mouth daily. Swallow whole.     atorvastatin (LIPITOR) 80 MG tablet Take 1 tablet (80 mg total) by mouth daily. 90 tablet 3   carvedilol (COREG) 12.5 MG tablet Take 1 tablet (12.5 mg total) by mouth 2 (two) times daily. 60 tablet 6   Cholecalciferol 25 MCG (1000 UT) tablet  Take 1,000 Units by mouth daily.     ezetimibe (ZETIA) 10 MG tablet Take 1 tablet (10 mg total) by mouth daily. 30 tablet 5   FLUoxetine (PROZAC) 40 MG capsule Take 1 capsule (40 mg total) by mouth daily. 30 capsule 5   fluticasone (FLONASE) 50 MCG/ACT nasal spray SHAKE LIQUID AND USE 2 SPRAYS IN EACH NOSTRIL DAILY AS NEEDED FOR RHINITIS OR ALLERGIES 16 g 11   furosemide (LASIX) 20 MG tablet Take 1 tablet (20 mg total) by mouth daily. 90 tablet 3   gabapentin (NEURONTIN) 800 MG tablet Take 1 tablet (800 mg total) by mouth 3 (three) times daily. Can increase to 4x/day if needed after 3 days- for nerve pain 120 tablet 5   ipratropium (ATROVENT) 0.02 % nebulizer solution Use 1 vial (0.5 mg total) by nebulization every 6 (six) hours as needed for wheezing or shortness of breath. 75 mL 12   ipratropium (ATROVENT) 0.03 % nasal spray Place 2 sprays into both nostrils every 12 (twelve) hours. 30 mL 12   levalbuterol (XOPENEX) 0.63 MG/3ML nebulizer solution Use 1 vial (0.63 mg total) by nebulization every 6 (  six) hours as needed for wheezing or shortness of breath. 75 mL 12   lisinopril (ZESTRIL) 20 MG tablet TAKE 1 TABLET BY MOUTH DAILY 30 tablet 0   morphine (MS CONTIN) 15 MG 12 hr tablet Take 1 tablet (15 mg total) by mouth every 12 (twelve) hours. For chronic pain- severe DJD 60 tablet 0   Oxycodone HCl 10 MG TABS Take 1 tablet (10 mg total) by mouth every 4 (four) hours as needed. Can be refilled as of 01/10/22- for chronic pain 150 tablet 0   potassium chloride (KLOR-CON) 10 MEQ tablet Take 2 tablets (20 mEq total) by mouth 2 (two) times daily. 120 tablet 11   tamsulosin (FLOMAX) 0.4 MG CAPS capsule Take 0.4 mg by mouth in the morning and at bedtime.     Tiotropium Bromide Monohydrate (SPIRIVA RESPIMAT) 2.5 MCG/ACT AERS Inhale 2 puffs into the lungs daily. 4 g 6   traMADol (ULTRAM) 50 MG tablet Take 2 tablets (100 mg total) by mouth 2 (two) times daily. 120 tablet 5   trolamine salicylate (ASPERCREME) 10  % cream Apply 1 Application topically 2 (two) times daily as needed (arthritis pain).     pantoprazole (PROTONIX) 40 MG tablet Take 1 tablet (40 mg total) by mouth daily. 30 tablet 2   No facility-administered medications prior to visit.    Family History  Problem Relation Age of Onset   Heart disease Father 77       Living   Coronary artery disease Father        CABG   Alzheimer's disease Mother 40       Deceased   Arthritis Mother    Aneurysm Brother    Stomach cancer Maternal Uncle    Brain cancer Maternal Aunt        x2   Obesity Daughter        Had Bypass Sx    Social History   Socioeconomic History   Marital status: Married    Spouse name: etta   Number of children: 2   Years of education: Not on file   Highest education level: Not on file  Occupational History   Occupation: retired    Fish farm manager: RETIRED    Comment: disabled due to back problems  Tobacco Use   Smoking status: Former    Packs/day: 1.50    Years: 50.00    Total pack years: 75.00    Types: Cigarettes    Quit date: 09/05/2017    Years since quitting: 4.2    Passive exposure: Never   Smokeless tobacco: Never   Tobacco comments:    smoked less than 1 ppd for 40+ years;   Vaping Use   Vaping Use: Never used  Substance and Sexual Activity   Alcohol use: Yes    Comment: occ   Drug use: Yes    Types: Oxycodone    Comment: has prescription   Sexual activity: Not Currently  Other Topics Concern   Not on file  Social History Narrative   ** Merged History Encounter **       Married (65rd), [place]. 2 children from 1st marriage, 4 step children.    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       Cell # [phone]   Social Determinants of Health   Financial Resource Strain: Low Risk  (06/07/2021)   Overall Financial Resource Strain (CARDIA)  Difficulty of Paying Living Expenses: Not hard at all  Food Insecurity:  No Food Insecurity (06/07/2021)   Hunger Vital Sign    Worried About Running Out of Food in the Last Year: Never true    Ran Out of Food in the Last Year: Never true  Transportation Needs: No Transportation Needs (06/07/2021)   PRAPARE - Hydrologist (Medical): No    Lack of Transportation (Non-Medical): No  Physical Activity: Inactive (06/07/2021)   Exercise Vital Sign    Days of Exercise per Week: 0 days    Minutes of Exercise per Session: 0 min  Stress: No Stress Concern Present (06/07/2021)   [place]    Feeling of Stress : Not at all  Social Connections: Moderately Integrated (06/07/2021)   Social Connection and Isolation Panel [NHANES]    Frequency of Communication with Friends and Family: Twice a week    Frequency of Social Gatherings with Friends and Family: Twice a week    Attends Religious Services: More than 4 times per year    Active Member of [employer] or Organizations: No    Attends Archivist Meetings: Never    Marital Status: Married  Human resources officer Violence: Not At Risk (06/07/2021)   Humiliation, Afraid, Rape, and Kick questionnaire    Fear of Current or Ex-Partner: No    Emotionally Abused: No    Physically Abused: No    Sexually Abused: No                                                                                                 Objective:  Physical Exam: BP 118/78 (BP Location: Left Arm, Patient Position: Sitting, Cuff Size: Large)   Pulse 72   Temp (!) 97.5 F (36.4 C) (Temporal)   Wt 169 lb (76.7 kg)   SpO2 98%   BMI 26.47 kg/m    General: No acute distress. Awake and conversant.  Eyes: Normal conjunctiva, anicteric. Round symmetric pupils.  ENT: No oropharyngeal lesions, no nasal drainage, moist mucous membranes Neck: Neck is supple. No masses or thyromegaly.  Respiratory: Respirations are non-labored.  Clear to auscultation laterally Skin: Warm. No  rashes or ulcers.  Psych: Alert and oriented. Cooperative, Appropriate mood and affect, Normal judgment.  CV: No cyanosis or JVD, RRR, MRG MSK: Right hand tender under the fifth digit, there is no overlying skin break or contusion, bilateral hips and back nontender Neuro: Sensation and CN II-XII grossly normal.        Alesia Banda, MD, MS

## 2021-12-21 NOTE — Progress Notes (Unsigned)
Initial visit for RT hand pain after a bathroom door at a restaurant swung back and hit it.

## 2021-12-21 NOTE — Telephone Encounter (Signed)
Fyi. Advised patient per Dr. Grandville Silos that his hand shows a fracture on the x-ray and that we will refer him to ortho and they will contact him for scheduling.

## 2021-12-21 NOTE — Telephone Encounter (Signed)
Called pt and let him know that sent in MS Contin on 11/3 and Oxycodone was filled 11/8-  He swears he has 128 pills of Oxycodone left- and swears he' hasn't been taking more of MS Contin than prescribed, nor Oxycodone.   I have asked for pt to come in to the office in the next 24 hours to get a pill count as well as to do another UDS- concern about EtOH, etc- although just did one, pt expected that one.   Pt agreed to the plan.

## 2021-12-21 NOTE — Patient Instructions (Signed)
For back pain, we gave you a steroid shot. We are rechecking xrays for back and hip.  Your strep throat was negative. Hopefully, the steroid shot will help with irritation.  For hand pain, we are checking an xray.

## 2021-12-22 ENCOUNTER — Telehealth: Payer: Self-pay | Admitting: *Deleted

## 2021-12-22 DIAGNOSIS — M961 Postlaminectomy syndrome, not elsewhere classified: Secondary | ICD-10-CM | POA: Diagnosis not present

## 2021-12-22 DIAGNOSIS — Z5181 Encounter for therapeutic drug level monitoring: Secondary | ICD-10-CM | POA: Diagnosis not present

## 2021-12-22 DIAGNOSIS — G894 Chronic pain syndrome: Secondary | ICD-10-CM | POA: Diagnosis not present

## 2021-12-22 DIAGNOSIS — Z79891 Long term (current) use of opiate analgesic: Secondary | ICD-10-CM

## 2021-12-22 NOTE — Telephone Encounter (Signed)
Mr Campoli is here for a pill count and a UDS.  He last filled his oxycodone10 mg #150 12/13/21 and #[id number] is expected. He has : #112 pill count appropriate Morphine Sulfate ER 15 mg was filled 11/16/21 and 0 would be expected. He reports he took last morphine this am.  Urine drug screen collected by [facility]. Await results

## 2021-12-26 DIAGNOSIS — M79641 Pain in right hand: Secondary | ICD-10-CM | POA: Diagnosis not present

## 2021-12-26 DIAGNOSIS — S62356A Nondisplaced fracture of shaft of fifth metacarpal bone, right hand, initial encounter for closed fracture: Secondary | ICD-10-CM | POA: Diagnosis not present

## 2021-12-26 NOTE — Telephone Encounter (Signed)
Pt contacted and seen in office for pill and UDS screening/count

## 2021-12-28 LAB — TOXASSURE SELECT,+ANTIDEPR,UR

## 2022-01-02 DIAGNOSIS — M7062 Trochanteric bursitis, left hip: Secondary | ICD-10-CM | POA: Insufficient documentation

## 2022-01-02 DIAGNOSIS — S72032D Displaced midcervical fracture of left femur, subsequent encounter for closed fracture with routine healing: Secondary | ICD-10-CM | POA: Diagnosis not present

## 2022-01-03 ENCOUNTER — Other Ambulatory Visit: Payer: Self-pay | Admitting: Physician Assistant

## 2022-01-03 NOTE — Telephone Encounter (Signed)
Patient returned call to the office and left message to advise he is taking 5 mg of prednisone currently. He is requesting the refill on 5 mg.

## 2022-01-03 NOTE — Telephone Encounter (Signed)
Please schedule patient a follow up visit. Patient due December 2023. Thanks!

## 2022-01-03 NOTE — Telephone Encounter (Signed)
Next Visit: Due December 2023. Message sent to the front to schedule.   Last Visit: 10/12/2021  Dx: Polymyalgia rheumatica   Current Dose per office note on 10/12/2021: prednisone 5 mg 1 tablet by mouth daily. reducing by 1 mg every 2 months for now.   Left message to advise patient we need to verify what dose of Prednisone he is taking. Advised patient he is due for a follow up visit.   Okay to refill Prednisone?

## 2022-01-04 ENCOUNTER — Telehealth: Payer: Self-pay | Admitting: Family Medicine

## 2022-01-04 NOTE — Telephone Encounter (Signed)
Pt stated that his hip is hurting and can you give him a shot again in his hip again because it helped him out when he seen you

## 2022-01-05 NOTE — Telephone Encounter (Signed)
Patient notified VI phone. He was given exercises to do for hip pain by Ortho and will give them a try.  Dm/cma

## 2022-01-11 DIAGNOSIS — M5416 Radiculopathy, lumbar region: Secondary | ICD-10-CM | POA: Diagnosis not present

## 2022-01-12 ENCOUNTER — Other Ambulatory Visit: Payer: Self-pay

## 2022-01-12 MED ORDER — OXYCODONE HCL 10 MG PO TABS: 10.0000 mg | ORAL_TABLET | ORAL | 0 refills | Status: DC | PRN

## 2022-01-12 NOTE — Telephone Encounter (Signed)
[facility] patient needs refill on oxycodone per pmp last fill was 12/14/21   Filled  Written  ID  Drug  QTY  Days  Prescriber  RX #  Dispenser  Refill  Daily Dose*  Pymt Type  PMP  01/03/2022 10/06/2021 1  Tramadol Hcl 50 Mg Tablet 120.00 30 Me Lov [id number] [facility] ([id number]) 1/5 40.00 MME Medicare [place] 12/25/2021 12/08/2021 1  Morphine Sulf Er 15 Mg Tablet 60.00 30 Me Lov [id number] [facility] ([id number]) 0/0 30.00 MME Medicare [place] 12/14/2021 12/13/2021 1  Oxycodone Hcl (Ir) 10 Mg Tab 150.00 30 Me Lov [id number] [facility] ([id number]) 0/0 75.00 MME Medicare [place]

## 2022-01-17 ENCOUNTER — Emergency Department (HOSPITAL_COMMUNITY): Payer: Medicare Other

## 2022-01-17 ENCOUNTER — Inpatient Hospital Stay (HOSPITAL_COMMUNITY)
Admission: EM | Admit: 2022-01-17 | Discharge: 2022-01-20 | DRG: 871 | Disposition: A | Discharge status: Home / Self Care | Payer: Medicare Other | Attending: Internal Medicine | Admitting: Internal Medicine

## 2022-01-17 ENCOUNTER — Encounter (HOSPITAL_COMMUNITY): Payer: Self-pay

## 2022-01-17 ENCOUNTER — Other Ambulatory Visit: Payer: Self-pay

## 2022-01-17 DIAGNOSIS — J690 Pneumonitis due to inhalation of food and vomit: Secondary | ICD-10-CM | POA: Insufficient documentation

## 2022-01-17 DIAGNOSIS — Z8673 Personal history of transient ischemic attack (TIA), and cerebral infarction without residual deficits: Secondary | ICD-10-CM

## 2022-01-17 DIAGNOSIS — Z981 Arthrodesis status: Secondary | ICD-10-CM

## 2022-01-17 DIAGNOSIS — K222 Esophageal obstruction: Secondary | ICD-10-CM | POA: Diagnosis not present

## 2022-01-17 DIAGNOSIS — D696 Thrombocytopenia, unspecified: Secondary | ICD-10-CM | POA: Diagnosis present

## 2022-01-17 DIAGNOSIS — W19XXXA Unspecified fall, initial encounter: Secondary | ICD-10-CM | POA: Diagnosis present

## 2022-01-17 DIAGNOSIS — J189 Pneumonia, unspecified organism: Secondary | ICD-10-CM | POA: Diagnosis not present

## 2022-01-17 DIAGNOSIS — Z7982 Long term (current) use of aspirin: Secondary | ICD-10-CM

## 2022-01-17 DIAGNOSIS — M479 Spondylosis, unspecified: Secondary | ICD-10-CM | POA: Diagnosis present

## 2022-01-17 DIAGNOSIS — J0100 Acute maxillary sinusitis, unspecified: Secondary | ICD-10-CM | POA: Diagnosis present

## 2022-01-17 DIAGNOSIS — C090 Malignant neoplasm of tonsillar fossa: Secondary | ICD-10-CM | POA: Diagnosis present

## 2022-01-17 DIAGNOSIS — I10 Essential (primary) hypertension: Secondary | ICD-10-CM | POA: Diagnosis not present

## 2022-01-17 DIAGNOSIS — Z923 Personal history of irradiation: Secondary | ICD-10-CM

## 2022-01-17 DIAGNOSIS — I959 Hypotension, unspecified: Secondary | ICD-10-CM | POA: Diagnosis present

## 2022-01-17 DIAGNOSIS — N401 Enlarged prostate with lower urinary tract symptoms: Secondary | ICD-10-CM | POA: Diagnosis present

## 2022-01-17 DIAGNOSIS — J449 Chronic obstructive pulmonary disease, unspecified: Secondary | ICD-10-CM | POA: Diagnosis present

## 2022-01-17 DIAGNOSIS — M533 Sacrococcygeal disorders, not elsewhere classified: Secondary | ICD-10-CM | POA: Diagnosis present

## 2022-01-17 DIAGNOSIS — D638 Anemia in other chronic diseases classified elsewhere: Secondary | ICD-10-CM | POA: Diagnosis present

## 2022-01-17 DIAGNOSIS — Z8249 Family history of ischemic heart disease and other diseases of the circulatory system: Secondary | ICD-10-CM

## 2022-01-17 DIAGNOSIS — Z1152 Encounter for screening for COVID-19: Secondary | ICD-10-CM

## 2022-01-17 DIAGNOSIS — Z87891 Personal history of nicotine dependence: Secondary | ICD-10-CM

## 2022-01-17 DIAGNOSIS — K219 Gastro-esophageal reflux disease without esophagitis: Secondary | ICD-10-CM | POA: Diagnosis present

## 2022-01-17 DIAGNOSIS — E871 Hypo-osmolality and hyponatremia: Secondary | ICD-10-CM | POA: Diagnosis present

## 2022-01-17 DIAGNOSIS — I110 Hypertensive heart disease with heart failure: Secondary | ICD-10-CM | POA: Diagnosis present

## 2022-01-17 DIAGNOSIS — G894 Chronic pain syndrome: Secondary | ICD-10-CM | POA: Diagnosis present

## 2022-01-17 DIAGNOSIS — Z953 Presence of xenogenic heart valve: Secondary | ICD-10-CM

## 2022-01-17 DIAGNOSIS — Z91048 Other nonmedicinal substance allergy status: Secondary | ICD-10-CM

## 2022-01-17 DIAGNOSIS — Z961 Presence of intraocular lens: Secondary | ICD-10-CM | POA: Diagnosis present

## 2022-01-17 DIAGNOSIS — M5386 Other specified dorsopathies, lumbar region: Secondary | ICD-10-CM | POA: Diagnosis not present

## 2022-01-17 DIAGNOSIS — I2510 Atherosclerotic heart disease of native coronary artery without angina pectoris: Secondary | ICD-10-CM | POA: Diagnosis present

## 2022-01-17 DIAGNOSIS — Z85818 Personal history of malignant neoplasm of other sites of lip, oral cavity, and pharynx: Secondary | ICD-10-CM

## 2022-01-17 DIAGNOSIS — R1312 Dysphagia, oropharyngeal phase: Secondary | ICD-10-CM | POA: Diagnosis present

## 2022-01-17 DIAGNOSIS — J441 Chronic obstructive pulmonary disease with (acute) exacerbation: Secondary | ICD-10-CM | POA: Diagnosis not present

## 2022-01-17 DIAGNOSIS — E785 Hyperlipidemia, unspecified: Secondary | ICD-10-CM | POA: Diagnosis present

## 2022-01-17 DIAGNOSIS — Z87438 Personal history of other diseases of male genital organs: Secondary | ICD-10-CM | POA: Diagnosis not present

## 2022-01-17 DIAGNOSIS — R338 Other retention of urine: Secondary | ICD-10-CM | POA: Diagnosis present

## 2022-01-17 DIAGNOSIS — R079 Chest pain, unspecified: Secondary | ICD-10-CM | POA: Diagnosis not present

## 2022-01-17 DIAGNOSIS — Z79891 Long term (current) use of opiate analgesic: Secondary | ICD-10-CM

## 2022-01-17 DIAGNOSIS — M549 Dorsalgia, unspecified: Secondary | ICD-10-CM | POA: Diagnosis not present

## 2022-01-17 DIAGNOSIS — Z952 Presence of prosthetic heart valve: Secondary | ICD-10-CM | POA: Diagnosis present

## 2022-01-17 DIAGNOSIS — Z7952 Long term (current) use of systemic steroids: Secondary | ICD-10-CM

## 2022-01-17 DIAGNOSIS — G4489 Other headache syndrome: Secondary | ICD-10-CM | POA: Diagnosis not present

## 2022-01-17 DIAGNOSIS — R6520 Severe sepsis without septic shock: Secondary | ICD-10-CM | POA: Diagnosis not present

## 2022-01-17 DIAGNOSIS — A419 Sepsis, unspecified organism: Principal | ICD-10-CM | POA: Diagnosis present

## 2022-01-17 DIAGNOSIS — Z955 Presence of coronary angioplasty implant and graft: Secondary | ICD-10-CM | POA: Diagnosis not present

## 2022-01-17 DIAGNOSIS — I5032 Chronic diastolic (congestive) heart failure: Secondary | ICD-10-CM | POA: Diagnosis present

## 2022-01-17 DIAGNOSIS — N179 Acute kidney failure, unspecified: Secondary | ICD-10-CM | POA: Diagnosis not present

## 2022-01-17 DIAGNOSIS — Z8261 Family history of arthritis: Secondary | ICD-10-CM

## 2022-01-17 DIAGNOSIS — Z9189 Other specified personal risk factors, not elsewhere classified: Secondary | ICD-10-CM | POA: Diagnosis not present

## 2022-01-17 DIAGNOSIS — Z79899 Other long term (current) drug therapy: Secondary | ICD-10-CM

## 2022-01-17 DIAGNOSIS — Z888 Allergy status to other drugs, medicaments and biological substances status: Secondary | ICD-10-CM

## 2022-01-17 DIAGNOSIS — E860 Dehydration: Secondary | ICD-10-CM | POA: Diagnosis present

## 2022-01-17 DIAGNOSIS — R1111 Vomiting without nausea: Secondary | ICD-10-CM | POA: Diagnosis not present

## 2022-01-17 DIAGNOSIS — N400 Enlarged prostate without lower urinary tract symptoms: Secondary | ICD-10-CM | POA: Diagnosis present

## 2022-01-17 DIAGNOSIS — Z9049 Acquired absence of other specified parts of digestive tract: Secondary | ICD-10-CM

## 2022-01-17 DIAGNOSIS — Z043 Encounter for examination and observation following other accident: Secondary | ICD-10-CM | POA: Diagnosis not present

## 2022-01-17 DIAGNOSIS — M961 Postlaminectomy syndrome, not elsewhere classified: Secondary | ICD-10-CM | POA: Diagnosis present

## 2022-01-17 DIAGNOSIS — M353 Polymyalgia rheumatica: Secondary | ICD-10-CM | POA: Diagnosis present

## 2022-01-17 DIAGNOSIS — Z96642 Presence of left artificial hip joint: Secondary | ICD-10-CM | POA: Diagnosis present

## 2022-01-17 LAB — CBC WITH DIFFERENTIAL/PLATELET
Abs Immature Granulocytes: 0.33 10*3/uL — ABNORMAL HIGH (ref 0.00–0.07)
Basophils Absolute: 0.1 10*3/uL (ref 0.0–0.1)
Basophils Relative: 0 %
Eosinophils Absolute: 0 10*3/uL (ref 0.0–0.5)
Eosinophils Relative: 0 %
HCT: 32.6 % — ABNORMAL LOW (ref 39.0–52.0)
Hemoglobin: 9.8 g/dL — ABNORMAL LOW (ref 13.0–17.0)
Immature Granulocytes: 1 %
Lymphocytes Relative: 5 %
Lymphs Abs: 1.1 10*3/uL (ref 0.7–4.0)
MCH: 25.9 pg — ABNORMAL LOW (ref 26.0–34.0)
MCHC: 30.1 g/dL (ref 30.0–36.0)
MCV: 86 fL (ref 80.0–100.0)
Monocytes Absolute: 0.9 10*3/uL (ref 0.1–1.0)
Monocytes Relative: 4 %
Neutro Abs: 20.6 10*3/uL — ABNORMAL HIGH (ref 1.7–7.7)
Neutrophils Relative %: 90 %
Platelets: 201 10*3/uL (ref 150–400)
RBC: 3.79 MIL/uL — ABNORMAL LOW (ref 4.22–5.81)
RDW: 14.9 % (ref 11.5–15.5)
WBC: 22.9 10*3/uL — ABNORMAL HIGH (ref 4.0–10.5)
nRBC: 0 % (ref 0.0–0.2)

## 2022-01-17 MED ORDER — SODIUM CHLORIDE 0.9 % IV BOLUS
500.0000 mL | Freq: Once | INTRAVENOUS | Status: AC
  Administered 2022-01-17: 500 mL via INTRAVENOUS

## 2022-01-17 NOTE — ED Triage Notes (Signed)
Fall yesterday injuring right ribs. Has had increased dizziness and involuntary movements. Sts this usually happens when he is dehydrated.   Sts he has  been dizzy and fell into his bed side rail. Takes oxycodone for chronic lower back pain.   Denies head injury or LOC.   Stopped taking anticoagulants 2 weeks ago as he's trying to get injections for back.

## 2022-01-18 ENCOUNTER — Telehealth: Payer: Self-pay

## 2022-01-18 ENCOUNTER — Emergency Department (HOSPITAL_COMMUNITY): Payer: Medicare Other

## 2022-01-18 ENCOUNTER — Encounter (HOSPITAL_COMMUNITY): Payer: Self-pay | Admitting: Emergency Medicine

## 2022-01-18 ENCOUNTER — Inpatient Hospital Stay (HOSPITAL_COMMUNITY): Payer: Medicare Other

## 2022-01-18 DIAGNOSIS — K222 Esophageal obstruction: Secondary | ICD-10-CM | POA: Diagnosis present

## 2022-01-18 DIAGNOSIS — R6520 Severe sepsis without septic shock: Secondary | ICD-10-CM | POA: Diagnosis present

## 2022-01-18 DIAGNOSIS — D638 Anemia in other chronic diseases classified elsewhere: Secondary | ICD-10-CM | POA: Diagnosis present

## 2022-01-18 DIAGNOSIS — E860 Dehydration: Secondary | ICD-10-CM | POA: Diagnosis present

## 2022-01-18 DIAGNOSIS — M479 Spondylosis, unspecified: Secondary | ICD-10-CM | POA: Diagnosis present

## 2022-01-18 DIAGNOSIS — R1312 Dysphagia, oropharyngeal phase: Secondary | ICD-10-CM

## 2022-01-18 DIAGNOSIS — Z87438 Personal history of other diseases of male genital organs: Secondary | ICD-10-CM

## 2022-01-18 DIAGNOSIS — Z952 Presence of prosthetic heart valve: Secondary | ICD-10-CM

## 2022-01-18 DIAGNOSIS — M961 Postlaminectomy syndrome, not elsewhere classified: Secondary | ICD-10-CM

## 2022-01-18 DIAGNOSIS — J690 Pneumonitis due to inhalation of food and vomit: Secondary | ICD-10-CM | POA: Insufficient documentation

## 2022-01-18 DIAGNOSIS — Z9189 Other specified personal risk factors, not elsewhere classified: Secondary | ICD-10-CM | POA: Diagnosis not present

## 2022-01-18 DIAGNOSIS — J0100 Acute maxillary sinusitis, unspecified: Secondary | ICD-10-CM | POA: Diagnosis present

## 2022-01-18 DIAGNOSIS — G894 Chronic pain syndrome: Secondary | ICD-10-CM | POA: Diagnosis present

## 2022-01-18 DIAGNOSIS — Z953 Presence of xenogenic heart valve: Secondary | ICD-10-CM | POA: Diagnosis not present

## 2022-01-18 DIAGNOSIS — M533 Sacrococcygeal disorders, not elsewhere classified: Secondary | ICD-10-CM | POA: Diagnosis present

## 2022-01-18 DIAGNOSIS — R338 Other retention of urine: Secondary | ICD-10-CM | POA: Diagnosis present

## 2022-01-18 DIAGNOSIS — I10 Essential (primary) hypertension: Secondary | ICD-10-CM

## 2022-01-18 DIAGNOSIS — A419 Sepsis, unspecified organism: Secondary | ICD-10-CM | POA: Insufficient documentation

## 2022-01-18 DIAGNOSIS — E871 Hypo-osmolality and hyponatremia: Secondary | ICD-10-CM | POA: Diagnosis present

## 2022-01-18 DIAGNOSIS — M353 Polymyalgia rheumatica: Secondary | ICD-10-CM

## 2022-01-18 DIAGNOSIS — I5032 Chronic diastolic (congestive) heart failure: Secondary | ICD-10-CM | POA: Diagnosis present

## 2022-01-18 DIAGNOSIS — N401 Enlarged prostate with lower urinary tract symptoms: Secondary | ICD-10-CM | POA: Diagnosis present

## 2022-01-18 DIAGNOSIS — I959 Hypotension, unspecified: Secondary | ICD-10-CM

## 2022-01-18 DIAGNOSIS — I110 Hypertensive heart disease with heart failure: Secondary | ICD-10-CM | POA: Diagnosis present

## 2022-01-18 DIAGNOSIS — Z1152 Encounter for screening for COVID-19: Secondary | ICD-10-CM | POA: Diagnosis not present

## 2022-01-18 DIAGNOSIS — D696 Thrombocytopenia, unspecified: Secondary | ICD-10-CM | POA: Diagnosis present

## 2022-01-18 DIAGNOSIS — N179 Acute kidney failure, unspecified: Secondary | ICD-10-CM | POA: Diagnosis present

## 2022-01-18 DIAGNOSIS — J441 Chronic obstructive pulmonary disease with (acute) exacerbation: Secondary | ICD-10-CM

## 2022-01-18 DIAGNOSIS — Z955 Presence of coronary angioplasty implant and graft: Secondary | ICD-10-CM

## 2022-01-18 DIAGNOSIS — E785 Hyperlipidemia, unspecified: Secondary | ICD-10-CM | POA: Diagnosis present

## 2022-01-18 DIAGNOSIS — M5386 Other specified dorsopathies, lumbar region: Secondary | ICD-10-CM

## 2022-01-18 DIAGNOSIS — N400 Enlarged prostate without lower urinary tract symptoms: Secondary | ICD-10-CM | POA: Diagnosis not present

## 2022-01-18 DIAGNOSIS — W19XXXA Unspecified fall, initial encounter: Secondary | ICD-10-CM | POA: Diagnosis present

## 2022-01-18 DIAGNOSIS — J449 Chronic obstructive pulmonary disease, unspecified: Secondary | ICD-10-CM | POA: Diagnosis present

## 2022-01-18 LAB — RESP PANEL BY RT-PCR (RSV, FLU A&B, COVID)  RVPGX2
Influenza A by PCR: NEGATIVE
Influenza B by PCR: NEGATIVE
Resp Syncytial Virus by PCR: NEGATIVE
SARS Coronavirus 2 by RT PCR: NEGATIVE

## 2022-01-18 LAB — CBC
HCT: 25.7 % — ABNORMAL LOW (ref 39.0–52.0)
Hemoglobin: 7.9 g/dL — ABNORMAL LOW (ref 13.0–17.0)
MCH: 26.5 pg (ref 26.0–34.0)
MCHC: 30.7 g/dL (ref 30.0–36.0)
MCV: 86.2 fL (ref 80.0–100.0)
Platelets: 118 10*3/uL — ABNORMAL LOW (ref 150–400)
RBC: 2.98 MIL/uL — ABNORMAL LOW (ref 4.22–5.81)
RDW: 14.7 % (ref 11.5–15.5)
WBC: 12.1 10*3/uL — ABNORMAL HIGH (ref 4.0–10.5)
nRBC: 0 % (ref 0.0–0.2)

## 2022-01-18 LAB — BASIC METABOLIC PANEL
Anion gap: 12 (ref 5–15)
BUN: 58 mg/dL — ABNORMAL HIGH (ref 8–23)
CO2: 23 mmol/L (ref 22–32)
Calcium: 8.4 mg/dL — ABNORMAL LOW (ref 8.9–10.3)
Chloride: 96 mmol/L — ABNORMAL LOW (ref 98–111)
Creatinine, Ser: 4.6 mg/dL — ABNORMAL HIGH (ref 0.61–1.24)
GFR, Estimated: 12 mL/min — ABNORMAL LOW (ref >60)
Glucose, Bld: 107 mg/dL — ABNORMAL HIGH (ref 70–99)
Potassium: 5 mmol/L (ref 3.5–5.1)
Sodium: 131 mmol/L — ABNORMAL LOW (ref 135–145)

## 2022-01-18 LAB — RAPID URINE DRUG SCREEN, HOSP PERFORMED
Amphetamines: NOT DETECTED
Barbiturates: NOT DETECTED
Benzodiazepines: NOT DETECTED
Cocaine: NOT DETECTED
Opiates: POSITIVE — AB
Tetrahydrocannabinol: NOT DETECTED

## 2022-01-18 LAB — COMPREHENSIVE METABOLIC PANEL
ALT: 13 U/L (ref 0–44)
AST: 21 U/L (ref 15–41)
Albumin: 2.1 g/dL — ABNORMAL LOW (ref 3.5–5.0)
Alkaline Phosphatase: 125 U/L (ref 38–126)
Anion gap: 10 (ref 5–15)
BUN: 56 mg/dL — ABNORMAL HIGH (ref 8–23)
CO2: 23 mmol/L (ref 22–32)
Calcium: 7.7 mg/dL — ABNORMAL LOW (ref 8.9–10.3)
Chloride: 99 mmol/L (ref 98–111)
Creatinine, Ser: 4 mg/dL — ABNORMAL HIGH (ref 0.61–1.24)
GFR, Estimated: 15 mL/min — ABNORMAL LOW (ref >60)
Glucose, Bld: 98 mg/dL (ref 70–99)
Potassium: 5.1 mmol/L (ref 3.5–5.1)
Sodium: 132 mmol/L — ABNORMAL LOW (ref 135–145)
Total Bilirubin: 0.6 mg/dL (ref 0.3–1.2)
Total Protein: 4.6 g/dL — ABNORMAL LOW (ref 6.5–8.1)

## 2022-01-18 LAB — CREATININE, SERUM
Creatinine, Ser: 2.43 mg/dL — ABNORMAL HIGH (ref 0.61–1.24)
GFR, Estimated: 26 mL/min — ABNORMAL LOW (ref >60)

## 2022-01-18 LAB — URINALYSIS, ROUTINE W REFLEX MICROSCOPIC
Bilirubin Urine: NEGATIVE
Glucose, UA: NEGATIVE mg/dL
Hgb urine dipstick: NEGATIVE
Ketones, ur: NEGATIVE mg/dL
Leukocytes,Ua: NEGATIVE
Nitrite: NEGATIVE
Protein, ur: NEGATIVE mg/dL
Specific Gravity, Urine: 1.02 (ref 1.005–1.030)
pH: 5 (ref 5.0–8.0)

## 2022-01-18 LAB — LACTIC ACID, PLASMA: Lactic Acid, Venous: 1.2 mmol/L (ref 0.5–1.9)

## 2022-01-18 LAB — PROTIME-INR
INR: 1.3 — ABNORMAL HIGH (ref 0.8–1.2)
Prothrombin Time: 16.4 seconds — ABNORMAL HIGH (ref 11.4–15.2)

## 2022-01-18 LAB — CK: Total CK: 365 U/L (ref 49–397)

## 2022-01-18 LAB — APTT: aPTT: 34 seconds (ref 24–36)

## 2022-01-18 MED ORDER — SODIUM CHLORIDE 0.9% FLUSH: 9.0000 mL | INTRAVENOUS | Status: DC | PRN

## 2022-01-18 MED ORDER — DIPHENHYDRAMINE HCL 50 MG/ML IJ SOLN: 12.5000 mg | Freq: Four times a day (QID) | INTRAMUSCULAR | Status: DC | PRN

## 2022-01-18 MED ORDER — DIPHENHYDRAMINE HCL 12.5 MG/5ML PO ELIX: 12.5000 mg | ORAL_SOLUTION | Freq: Four times a day (QID) | ORAL | Status: DC | PRN

## 2022-01-18 MED ORDER — ALBUTEROL SULFATE (2.5 MG/3ML) 0.083% IN NEBU: 2.5000 mg | INHALATION_SOLUTION | RESPIRATORY_TRACT | Status: DC | PRN

## 2022-01-18 MED ORDER — NALOXONE HCL 0.4 MG/ML IJ SOLN: 0.4000 mg | INTRAMUSCULAR | Status: DC | PRN

## 2022-01-18 MED ORDER — HYDROMORPHONE HCL 1 MG/ML IJ SOLN: 1.0000 mg | INTRAMUSCULAR | Status: DC | PRN

## 2022-01-18 MED ORDER — SODIUM CHLORIDE 0.9 % IV SOLN
3.0000 g | Freq: Two times a day (BID) | INTRAVENOUS | Status: DC
  Administered 2022-01-18 – 2022-01-19 (×2): 3 g via INTRAVENOUS
  Filled 2022-01-18 (×2): qty 8

## 2022-01-18 MED ORDER — ONDANSETRON HCL 4 MG/2ML IJ SOLN: 4.0000 mg | Freq: Four times a day (QID) | INTRAMUSCULAR | Status: DC | PRN

## 2022-01-18 MED ORDER — HEPARIN SODIUM (PORCINE) 5000 UNIT/ML IJ SOLN
5000.0000 [IU] | Freq: Three times a day (TID) | INTRAMUSCULAR | Status: DC
  Administered 2022-01-18 – 2022-01-20 (×7): 5000 [IU] via SUBCUTANEOUS
  Filled 2022-01-18 (×7): qty 1

## 2022-01-18 MED ORDER — HYDROMORPHONE HCL 1 MG/ML IJ SOLN
0.5000 mg | INTRAMUSCULAR | Status: DC | PRN
  Administered 2022-01-19 – 2022-01-20 (×4): 0.5 mg via INTRAVENOUS
  Filled 2022-01-18 (×4): qty 0.5

## 2022-01-18 MED ORDER — MORPHINE SULFATE (PF) 2 MG/ML IV SOLN: 2.0000 mg | INTRAVENOUS | Status: DC | PRN

## 2022-01-18 MED ORDER — TRAMADOL HCL 50 MG PO TABS
50.0000 mg | ORAL_TABLET | Freq: Three times a day (TID) | ORAL | Status: DC | PRN
  Administered 2022-01-19 – 2022-01-20 (×2): 50 mg via ORAL
  Filled 2022-01-18 (×3): qty 1

## 2022-01-18 MED ORDER — LACTATED RINGERS IV BOLUS (SEPSIS)
1000.0000 mL | Freq: Once | INTRAVENOUS | Status: AC
  Administered 2022-01-18: 1000 mL via INTRAVENOUS

## 2022-01-18 MED ORDER — ACETAMINOPHEN 650 MG RE SUPP: 650.0000 mg | Freq: Four times a day (QID) | RECTAL | Status: DC | PRN

## 2022-01-18 MED ORDER — OXYCODONE HCL 5 MG PO TABS
10.0000 mg | ORAL_TABLET | Freq: Four times a day (QID) | ORAL | Status: DC | PRN
  Administered 2022-01-18 – 2022-01-20 (×6): 10 mg via ORAL
  Filled 2022-01-18 (×5): qty 2

## 2022-01-18 MED ORDER — PREDNISONE 5 MG PO TABS
5.0000 mg | ORAL_TABLET | Freq: Every day | ORAL | Status: DC
  Administered 2022-01-19 – 2022-01-20 (×2): 5 mg via ORAL
  Filled 2022-01-18 (×2): qty 1

## 2022-01-18 MED ORDER — SODIUM CHLORIDE 0.9% FLUSH
3.0000 mL | Freq: Two times a day (BID) | INTRAVENOUS | Status: DC
  Administered 2022-01-18 – 2022-01-20 (×4): 3 mL via INTRAVENOUS

## 2022-01-18 MED ORDER — SODIUM CHLORIDE 0.9 % IV SOLN
INTRAVENOUS | Status: DC
  Administered 2022-01-18: 125 mL/h via INTRAVENOUS

## 2022-01-18 MED ORDER — HYDROMORPHONE 1 MG/ML IV SOLN: INTRAVENOUS | Status: DC

## 2022-01-18 MED ORDER — LACTATED RINGERS IV BOLUS: 500.0000 mL | Freq: Once | INTRAVENOUS | Status: DC

## 2022-01-18 MED ORDER — FENTANYL CITRATE PF 50 MCG/ML IJ SOSY
25.0000 ug | PREFILLED_SYRINGE | Freq: Once | INTRAMUSCULAR | Status: AC
  Administered 2022-01-18: 25 ug via INTRAVENOUS
  Filled 2022-01-18: qty 1

## 2022-01-18 MED ORDER — ACETAMINOPHEN 500 MG PO TABS
1000.0000 mg | ORAL_TABLET | Freq: Once | ORAL | Status: DC
  Filled 2022-01-18: qty 2

## 2022-01-18 MED ORDER — METRONIDAZOLE 500 MG/100ML IV SOLN
500.0000 mg | Freq: Once | INTRAVENOUS | Status: AC
  Administered 2022-01-18: 500 mg via INTRAVENOUS
  Filled 2022-01-18: qty 100

## 2022-01-18 MED ORDER — IPRATROPIUM-ALBUTEROL 0.5-2.5 (3) MG/3ML IN SOLN: 3.0000 mL | RESPIRATORY_TRACT | Status: DC | PRN

## 2022-01-18 MED ORDER — MORPHINE SULFATE 1 MG/ML IV SOLN PCA: INTRAVENOUS | Status: DC

## 2022-01-18 MED ORDER — LACTATED RINGERS IV BOLUS (SEPSIS)
500.0000 mL | Freq: Once | INTRAVENOUS | Status: AC
  Administered 2022-01-18: 500 mL via INTRAVENOUS

## 2022-01-18 MED ORDER — VANCOMYCIN HCL IN DEXTROSE 1-5 GM/200ML-% IV SOLN: 1000.0000 mg | Freq: Once | INTRAVENOUS | Status: DC

## 2022-01-18 MED ORDER — POLYETHYLENE GLYCOL 3350 17 G PO PACK: 17.0000 g | PACK | Freq: Two times a day (BID) | ORAL | Status: DC | PRN

## 2022-01-18 MED ORDER — SODIUM CHLORIDE 0.9 % IV SOLN
2.0000 g | Freq: Once | INTRAVENOUS | Status: AC
  Administered 2022-01-18: 2 g via INTRAVENOUS
  Filled 2022-01-18: qty 12.5

## 2022-01-18 MED ORDER — LACTATED RINGERS IV BOLUS
500.0000 mL | Freq: Once | INTRAVENOUS | Status: AC
  Administered 2022-01-18: 500 mL via INTRAVENOUS

## 2022-01-18 MED ORDER — VANCOMYCIN HCL 1500 MG/300ML IV SOLN
1500.0000 mg | Freq: Once | INTRAVENOUS | Status: AC
  Administered 2022-01-18: 1500 mg via INTRAVENOUS
  Filled 2022-01-18: qty 300

## 2022-01-18 MED ORDER — LACTATED RINGERS IV SOLN: INTRAVENOUS | Status: DC

## 2022-01-18 MED ORDER — OXYCODONE HCL ER 10 MG PO T12A
10.0000 mg | EXTENDED_RELEASE_TABLET | Freq: Two times a day (BID) | ORAL | Status: DC
  Administered 2022-01-18 – 2022-01-20 (×5): 10 mg via ORAL
  Filled 2022-01-18 (×5): qty 1

## 2022-01-18 MED ORDER — SENNOSIDES-DOCUSATE SODIUM 8.6-50 MG PO TABS
1.0000 | ORAL_TABLET | Freq: Two times a day (BID) | ORAL | Status: DC
  Administered 2022-01-18 – 2022-01-20 (×4): 1 via ORAL
  Filled 2022-01-18 (×5): qty 1

## 2022-01-18 MED ORDER — HYDROMORPHONE HCL 1 MG/ML IJ SOLN
1.0000 mg | Freq: Once | INTRAMUSCULAR | Status: AC
  Administered 2022-01-18: 1 mg via INTRAVENOUS
  Filled 2022-01-18: qty 1

## 2022-01-18 MED ORDER — ACETAMINOPHEN 325 MG PO TABS
650.0000 mg | ORAL_TABLET | Freq: Four times a day (QID) | ORAL | Status: DC | PRN
  Administered 2022-01-20: 650 mg via ORAL
  Filled 2022-01-18: qty 2

## 2022-01-18 MED ORDER — LIDOCAINE 5 % EX PTCH
2.0000 | MEDICATED_PATCH | CUTANEOUS | Status: DC
  Administered 2022-01-18 – 2022-01-19 (×3): 2 via TRANSDERMAL
  Filled 2022-01-18 (×2): qty 2

## 2022-01-18 MED ORDER — METOPROLOL TARTRATE 5 MG/5ML IV SOLN
2.5000 mg | Freq: Four times a day (QID) | INTRAVENOUS | Status: DC
  Administered 2022-01-18 – 2022-01-19 (×5): 2.5 mg via INTRAVENOUS
  Filled 2022-01-18 (×5): qty 5

## 2022-01-18 NOTE — Telephone Encounter (Signed)
Wife is stating that the doctors at the hospital is concerned about him being on the morphine and oxycodone because he is out of his head and urinating uncontrollably and he zones out, he is addicted to the pain medication and the doctors at the hospital is stating that they can't believe that he is on that much pain medication, his wife is still stating that he may be taking the medication more than what he is suppose to and then when it is time for pill counts he slows down.

## 2022-01-18 NOTE — ED Notes (Signed)
Pt expressed, "I'm gonna have someone come and pick me up, I don't agree with this, something is going on here and I don't like it." "I want to see a doctor now!" Informed EDP.

## 2022-01-18 NOTE — ED Provider Notes (Signed)
[facility] DEPT Provider Note   CSN: [id number] Arrival date & time: 01/17/22  2238     History  Chief Complaint  Patient presents with   Brandon [name]    BRYAR DAHMS is a 81 y.o. male.  The history is provided by the patient.  Fall This is a new problem. The current episode started yesterday. The problem occurs constantly. The problem has been resolved. Associated symptoms include shortness of breath. Pertinent negatives include no chest pain, no abdominal pain and no headaches. Associated symptoms comments: Rib pain . Nothing aggravates the symptoms. Nothing relieves the symptoms. He has tried nothing for the symptoms. The treatment provided no relief.  Patient with chronic pain on 3 home narcotics as well as TAVR presents with fall at home.  Pain the right ribs.  Also cough and hypotension.      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 Burt Knack and Dr Cyndia Bent    Severe aortic stenosis    TIA (transient ischemic attack) 1990s   hx   tonsillar ca dx'd 11/2017   Tubular adenoma of colon 2009     Home Medications Prior to Admission medications   Medication Sig Start Date End Date Taking? Authorizing Provider  acetaminophen (TYLENOL) 500 MG tablet Take 1,000 mg by mouth every  8 (eight) hours as needed for mild pain.    [provider]  albuterol (VENTOLIN HFA) 108 (90 Base) MCG/ACT inhaler INHALE 2 PUFFS INTO THE LUNGS EVERY 6 HOURS AS NEEDED FOR WHEEZING OR SHORTNESS OF BREATH 10/19/21   Haydee Salter, MD  allopurinol (ZYLOPRIM) 100 MG tablet TAKE 2 TABLETS(200 MG) BY MOUTH DAILY 10/10/21   Ofilia Neas, PA-C  amLODipine (NORVASC) 10 MG tablet TAKE 1 TABLET(10 MG) BY MOUTH DAILY 06/26/21   Larey Dresser, MD  aspirin EC 81 MG tablet Take 81 mg by mouth daily. Swallow whole.    [provider]  atorvastatin (LIPITOR) 80 MG tablet Take 1 tablet (80 mg total) by mouth daily. 03/21/21   Larey Dresser, MD  carvedilol (COREG) 12.5 MG tablet Take 1 tablet (12.5 mg total) by mouth 2 (two) times daily. 03/24/21 03/24/22  Larey Dresser, MD  Cholecalciferol 25 MCG (1000 UT) tablet Take 1,000 Units by mouth daily.    [provider]  ezetimibe (ZETIA) 10 MG tablet Take 1 tablet (10 mg total) by mouth daily. 12/04/21 12/04/22  Larey Dresser, MD  FLUoxetine (PROZAC) 40 MG capsule Take 1 capsule (40 mg total) by mouth daily. 10/06/21   Lovorn, Jinny Blossom, MD  fluticasone (FLONASE) 50 MCG/ACT nasal spray SHAKE LIQUID AND USE 2 SPRAYS IN EACH NOSTRIL  DAILY AS NEEDED FOR RHINITIS OR ALLERGIES 11/14/20   Haydee Salter, MD  furosemide (LASIX) 20 MG tablet Take 1 tablet (20 mg total) by mouth daily. 11/08/21 02/06/22  Larey Dresser, MD  gabapentin (NEURONTIN) 800 MG tablet Take 1 tablet (800 mg total) by mouth 3 (three) times daily. Can increase to 4x/day if needed after 3 days- for nerve pain 12/08/21   Lovorn, Jinny Blossom, MD  ipratropium (ATROVENT) 0.02 % nebulizer solution Use 1 vial (0.5 mg total) by nebulization every 6 (six) hours as needed for wheezing or shortness of breath. 08/21/21   Raiford Noble Latif, DO  ipratropium (ATROVENT) 0.03 % nasal spray Place 2 sprays into both nostrils every 12 (twelve) hours. 01/24/21   Haydee Salter, MD  levalbuterol  Penne Lash) 0.63 MG/3ML nebulizer solution Use 1 vial (0.63 mg total) by nebulization every 6 (six) hours as needed for wheezing or shortness of breath. 08/21/21   Raiford Noble Latif, DO  lisinopril (ZESTRIL) 20 MG tablet TAKE 1 TABLET BY MOUTH DAILY 11/08/21   [place], Maricela Bo, FNP  morphine (MS CONTIN) 15 MG 12 hr tablet Take 1 tablet (15 mg total) by mouth every 12 (twelve) hours. For chronic pain- severe DJD 12/08/21   Lovorn, Jinny Blossom, MD  Oxycodone HCl 10 MG TABS Take 1 tablet (10 mg total) by mouth every 4 (four) hours as needed. Can be refilled as of 01/10/22- for chronic pain 01/12/22   [name], MD  pantoprazole (PROTONIX) 40 MG tablet Take 1 tablet (40 mg total) by mouth daily. 06/30/21 10/18/21  [place] Donnamarie Poag, DO  potassium chloride (KLOR-CON) 10 MEQ tablet Take 2 tablets (20 mEq total) by mouth 2 (two) times daily. 05/01/21   [place], Maricela Bo, FNP  predniSONE (DELTASONE) 5 MG tablet TAKE 1 TABLET(5 MG) BY MOUTH DAILY WITH BREAKFAST 01/03/22   Bo Merino, MD  tamsulosin (FLOMAX) 0.4 MG CAPS capsule Take 0.4 mg by mouth in the morning and at bedtime. 12/23/18   [provider]  Tiotropium Bromide Monohydrate (SPIRIVA RESPIMAT) 2.5 MCG/ACT AERS Inhale 2 puffs into the lungs daily. 01/24/21   Haydee Salter, MD  traMADol (ULTRAM) 50 MG tablet Take 2 tablets (100 mg total) by mouth 2 (two) times daily. 10/06/21   Lovorn, Jinny Blossom, MD  trolamine salicylate (ASPERCREME) 10 % cream Apply 1 Application topically 2 (two) times daily as needed (arthritis pain).    [provider]      Allergies    Celebrex [celecoxib] and Tape    Review of Systems   Review of Systems  Constitutional:  Negative for fever.  HENT:  Negative for facial swelling.   Eyes:  Negative for redness.  Respiratory:  Positive for shortness of breath.   Cardiovascular:  Negative for chest pain.  Gastrointestinal:  Negative for abdominal pain.  Genitourinary:  Negative for dysuria.  Neurological:   Negative for headaches.  All other systems reviewed and are negative.   Physical Exam Updated Vital Signs BP (S) (!) 70/60 (BP Location: Right Arm)   Pulse 95   Temp 97.7 F (36.5 C) (Oral)   Resp 16   SpO2 97%  Physical Exam Vitals and nursing note reviewed.  Constitutional:      General: He is not in acute distress.    Appearance: He is well-developed. He is not diaphoretic.  HENT:     Head: Normocephalic and atraumatic.     Nose: Nose normal.  Eyes:     Conjunctiva/sclera: Conjunctivae normal.  Pupils: Pupils are equal, round, and reactive to light.  Cardiovascular:     Rate and Rhythm: Normal rate and regular rhythm.     Pulses: Normal pulses.     Heart sounds: Normal heart sounds.  Pulmonary:     Effort: Pulmonary effort is normal.     Breath sounds: Rales present. No wheezing.  Abdominal:     General: Bowel sounds are normal.     Palpations: Abdomen is soft.     Tenderness: There is no abdominal tenderness. There is no guarding or rebound.  Musculoskeletal:        General: Normal range of motion.     Cervical back: Normal range of motion and neck supple.  Skin:    General: Skin is warm and dry.     Capillary Refill: Capillary refill takes less than 2 seconds.  Neurological:     General: No focal deficit present.     Mental Status: He is alert and oriented to person, place, and time.     Cranial Nerves: No cranial nerve deficit.     Deep Tendon Reflexes: Reflexes normal.  Psychiatric:        Mood and Affect: Mood normal.        Behavior: Behavior normal.    ED Results / Procedures / Treatments   Labs (all labs ordered are listed, but only abnormal results are displayed) Results for orders placed or performed during the hospital encounter of 01/17/22  Resp panel by RT-PCR (RSV, Flu A&B, Covid) Anterior Nasal Swab   Specimen: Anterior Nasal Swab  Result Value Ref Range   SARS Coronavirus 2 by RT PCR NEGATIVE NEGATIVE   Influenza A by PCR NEGATIVE  NEGATIVE   Influenza B by PCR NEGATIVE NEGATIVE   Resp Syncytial Virus by PCR NEGATIVE NEGATIVE  CBC with Differential  Result Value Ref Range   WBC 22.9 (H) 4.0 - 10.5 K/uL   RBC 3.79 (L) 4.22 - 5.81 MIL/uL   Hemoglobin 9.8 (L) 13.0 - 17.0 g/dL   HCT 32.6 (L) 39.0 - 52.0 %   MCV 86.0 80.0 - 100.0 fL   MCH 25.9 (L) 26.0 - 34.0 pg   MCHC 30.1 30.0 - 36.0 g/dL   RDW 14.9 11.5 - 15.5 %   Platelets 201 150 - 400 K/uL   nRBC 0.0 0.0 - 0.2 %   Neutrophils Relative % 90 %   Neutro Abs 20.6 (H) 1.7 - 7.7 K/uL   Lymphocytes Relative 5 %   Lymphs Abs 1.1 0.7 - 4.0 K/uL   Monocytes Relative 4 %   Monocytes Absolute 0.9 0.1 - 1.0 K/uL   Eosinophils Relative 0 %   Eosinophils Absolute 0.0 0.0 - 0.5 K/uL   Basophils Relative 0 %   Basophils Absolute 0.1 0.0 - 0.1 K/uL   Immature Granulocytes 1 %   Abs Immature Granulocytes 0.33 (H) 0.00 - 0.07 K/uL  Basic metabolic panel  Result Value Ref Range   Sodium 131 (L) 135 - 145 mmol/L   Potassium 5.0 3.5 - 5.1 mmol/L   Chloride 96 (L) 98 - 111 mmol/L   CO2 23 22 - 32 mmol/L   Glucose, Bld 107 (H) 70 - 99 mg/dL   BUN 58 (H) 8 - 23 mg/dL   Creatinine, Ser 4.60 (H) 0.61 - 1.24 mg/dL   Calcium 8.4 (L) 8.9 - 10.3 mg/dL   GFR, Estimated 12 (L) >60 mL/min   Anion gap 12 5 - 15  Lactic acid, plasma  Result Value Ref Range   Lactic Acid, Venous 1.2 0.5 - 1.9 mmol/L   *Note: Due to a large number of results and/or encounters for the requested time period, some results have not been displayed. A complete set of results can be found in Results Review.   CT Head Wo Contrast  Result Date: 01/18/2022 CLINICAL DATA:  Recent fall EXAM: CT HEAD WITHOUT CONTRAST CT CERVICAL SPINE WITHOUT CONTRAST TECHNIQUE: Multidetector CT imaging of the head and cervical spine was performed following the standard protocol without intravenous contrast. Multiplanar CT image reconstructions of the cervical spine were also generated. RADIATION DOSE REDUCTION: This exam was  performed according to the departmental dose-optimization program which includes automated exposure control, adjustment of the mA and/or kV according to patient size and/or use of iterative reconstruction technique. COMPARISON:  None Available. FINDINGS: CT HEAD FINDINGS Brain: No evidence of acute infarction, hemorrhage, hydrocephalus, extra-axial collection or mass lesion/mass effect. Chronic atrophic changes are noted. Vascular: No hyperdense vessel or unexpected calcification. Skull: Normal. Negative for fracture or focal lesion. Sinuses/Orbits: Orbits and their contents are within normal limits. Paranasal sinuses show air-fluid level in the right maxillary antrum consistent with sinusitis. Other: None CT CERVICAL SPINE FINDINGS Alignment: Within normal limits. Skull base and vertebrae: Cervical segments are well visualized. Vertebral body is well maintained. Osteophytic changes are noted disc space narrowing particularly at C5-6. Facet hypertrophic changes are noted bilaterally. No acute fracture or acute facet abnormality is noted. The odontoid is within normal limits. Soft tissues and spinal canal: Surrounding soft tissue structures are within normal limits. Upper chest: Visualized lung apices are within normal limits. Other: None IMPRESSION: CT of the head: Chronic atrophic changes without acute intracranial abnormality. Air-fluid level within the right maxillary antrum suspicious for acute sinusitis. CT of the cervical spine: Multilevel degenerative change without acute abnormality. Electronically Signed   By: Inez Catalina M.D.   On: 01/18/2022 00:46   CT Cervical Spine Wo Contrast  Result Date: 01/18/2022 CLINICAL DATA:  Recent fall EXAM: CT HEAD WITHOUT CONTRAST CT CERVICAL SPINE WITHOUT CONTRAST TECHNIQUE: Multidetector CT imaging of the head and cervical spine was performed following the standard protocol without intravenous contrast. Multiplanar CT image reconstructions of the cervical spine were  also generated. RADIATION DOSE REDUCTION: This exam was performed according to the departmental dose-optimization program which includes automated exposure control, adjustment of the mA and/or kV according to patient size and/or use of iterative reconstruction technique. COMPARISON:  None Available. FINDINGS: CT HEAD FINDINGS Brain: No evidence of acute infarction, hemorrhage, hydrocephalus, extra-axial collection or mass lesion/mass effect. Chronic atrophic changes are noted. Vascular: No hyperdense vessel or unexpected calcification. Skull: Normal. Negative for fracture or focal lesion. Sinuses/Orbits: Orbits and their contents are within normal limits. Paranasal sinuses show air-fluid level in the right maxillary antrum consistent with sinusitis. Other: None CT CERVICAL SPINE FINDINGS Alignment: Within normal limits. Skull base and vertebrae: Cervical segments are well visualized. Vertebral body is well maintained. Osteophytic changes are noted disc space narrowing particularly at C5-6. Facet hypertrophic changes are noted bilaterally. No acute fracture or acute facet abnormality is noted. The odontoid is within normal limits. Soft tissues and spinal canal: Surrounding soft tissue structures are within normal limits. Upper chest: Visualized lung apices are within normal limits. Other: None IMPRESSION: CT of the head: Chronic atrophic changes without acute intracranial abnormality. Air-fluid level within the right maxillary antrum suspicious for acute sinusitis. CT of the cervical spine: Multilevel degenerative change without acute abnormality. Electronically Signed  By: Inez Catalina M.D.   On: 01/18/2022 00:46   DG Chest Portable 1 View  Result Date: 01/18/2022 CLINICAL DATA:  Recent fall with chest pain, initial encounter EXAM: PORTABLE CHEST 1 VIEW COMPARISON:  08/21/2021 FINDINGS: Cardiac shadow is within normal limits. Prior TAVR is again noted. Aortic calcifications are seen. New patchy infiltrate is  noted in the right base consistent with pneumonia. No bony abnormality is seen. IMPRESSION: Right basilar pneumonia. Electronically Signed   By: Inez Catalina M.D.   On: 01/18/2022 00:04   DG Lumbar Spine Complete  Result Date: 12/23/2021 CLINICAL DATA:  Chronic low back pain. EXAM: LUMBAR SPINE - COMPLETE 4+ VIEW COMPARISON:  Plain films lumbar spine 03/09/2016. FINDINGS: There is no fracture or malalignment. Again seen is postoperative change of L4-5 fusion. Multilevel loss of disc space height and endplate spurring have progressed at all levels since the prior examination. Aortic stent graft and left hip replacement are partially imaged. 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 Status post L4-5 fusion. Electronically Signed   By: Inge Rise M.D.   On: 12/23/2021 08:21   DG Hand Complete Right  Result Date: 12/22/2021 CLINICAL DATA:  Hand pain. EXAM: RIGHT HAND - COMPLETE 3+ VIEW COMPARISON:  Right hand radiographs 08/11/2020 FINDINGS: There is diffuse decreased bone mineralization. Degenerative changes including joint space narrowing, subchondral sclerosis/cystic change, and peripheral osteophytosis are severe at the thumb carpometacarpal joint, moderate to severe at the thumb interphalangeal joint, moderate at the second and third greater than fourth and fifth DIP joints, mild-to-moderate at the second through fifth PIP joints, moderate to severe at the second and third and mild at the fourth and fifth metacarpophalangeal joints, moderate at the triscaphe and second carpometacarpal joints, and moderate at the radiocarpal joint. There is a new oblique fracture within the mid to distal diaphysis of the fifth metacarpal with minimal 2 mm lateral displacement of the distal fracture component with respect to the proximal fracture component. IMPRESSION: 1. New oblique, mildly displaced acute fracture within the mid to distal diaphysis of the proximal phalanx.  2. Moderate to severe osteoarthritis, greatest at the thumb carpometacarpal joint. Electronically Signed   By: Yvonne Kendall M.D.   On: 12/22/2021 19:08     EKG  EKG Interpretation  Date/Time:  Thursday January 18 2022 00:50:32 EST Ventricular Rate:  82 PR Interval:  146 QRS Duration: 95 QT Interval:  374 QTC Calculation: 437 R Axis:   78 Text Interpretation: Sinus rhythm Confirmed by Randal Buba, Jaben Benegas ([id number]) on 01/18/2022 2:21:28 AM         Radiology CT Head Wo Contrast  Result Date: 01/18/2022 CLINICAL DATA:  Recent fall EXAM: CT HEAD WITHOUT CONTRAST CT CERVICAL SPINE WITHOUT CONTRAST TECHNIQUE: Multidetector CT imaging of the head and cervical spine was performed following the standard protocol without intravenous contrast. Multiplanar CT image reconstructions of the cervical spine were also generated. RADIATION DOSE REDUCTION: This exam was performed according to the departmental dose-optimization program which includes automated exposure control, adjustment of the mA and/or kV according to patient size and/or use of iterative reconstruction technique. COMPARISON:  None Available. FINDINGS: CT HEAD FINDINGS Brain: No evidence of acute infarction, hemorrhage, hydrocephalus, extra-axial collection or mass lesion/mass effect. Chronic atrophic changes are noted. Vascular: No hyperdense vessel or unexpected calcification. Skull: Normal. Negative for fracture or focal lesion. Sinuses/Orbits: Orbits and their contents are within normal limits. Paranasal sinuses show air-fluid level in the right maxillary antrum consistent with  sinusitis. Other: None CT CERVICAL SPINE FINDINGS Alignment: Within normal limits. Skull base and vertebrae: Cervical segments are well visualized. Vertebral body is well maintained. Osteophytic changes are noted disc space narrowing particularly at C5-6. Facet hypertrophic changes are noted bilaterally. No acute fracture or acute facet abnormality is noted. The odontoid  is within normal limits. Soft tissues and spinal canal: Surrounding soft tissue structures are within normal limits. Upper chest: Visualized lung apices are within normal limits. Other: None IMPRESSION: CT of the head: Chronic atrophic changes without acute intracranial abnormality. Air-fluid level within the right maxillary antrum suspicious for acute sinusitis. CT of the cervical spine: Multilevel degenerative change without acute abnormality. Electronically Signed   By: Inez Catalina M.D.   On: 01/18/2022 00:46   CT Cervical Spine Wo Contrast  Result Date: 01/18/2022 CLINICAL DATA:  Recent fall EXAM: CT HEAD WITHOUT CONTRAST CT CERVICAL SPINE WITHOUT CONTRAST TECHNIQUE: Multidetector CT imaging of the head and cervical spine was performed following the standard protocol without intravenous contrast. Multiplanar CT image reconstructions of the cervical spine were also generated. RADIATION DOSE REDUCTION: This exam was performed according to the departmental dose-optimization program which includes automated exposure control, adjustment of the mA and/or kV according to patient size and/or use of iterative reconstruction technique. COMPARISON:  None Available. FINDINGS: CT HEAD FINDINGS Brain: No evidence of acute infarction, hemorrhage, hydrocephalus, extra-axial collection or mass lesion/mass effect. Chronic atrophic changes are noted. Vascular: No hyperdense vessel or unexpected calcification. Skull: Normal. Negative for fracture or focal lesion. Sinuses/Orbits: Orbits and their contents are within normal limits. Paranasal sinuses show air-fluid level in the right maxillary antrum consistent with sinusitis. Other: None CT CERVICAL SPINE FINDINGS Alignment: Within normal limits. Skull base and vertebrae: Cervical segments are well visualized. Vertebral body is well maintained. Osteophytic changes are noted disc space narrowing particularly at C5-6. Facet hypertrophic changes are noted bilaterally. No acute  fracture or acute facet abnormality is noted. The odontoid is within normal limits. Soft tissues and spinal canal: Surrounding soft tissue structures are within normal limits. Upper chest: Visualized lung apices are within normal limits. Other: None IMPRESSION: CT of the head: Chronic atrophic changes without acute intracranial abnormality. Air-fluid level within the right maxillary antrum suspicious for acute sinusitis. CT of the cervical spine: Multilevel degenerative change without acute abnormality. Electronically Signed   By: Inez Catalina M.D.   On: 01/18/2022 00:46   DG Chest Portable 1 View  Result Date: 01/18/2022 CLINICAL DATA:  Recent fall with chest pain, initial encounter EXAM: PORTABLE CHEST 1 VIEW COMPARISON:  08/21/2021 FINDINGS: Cardiac shadow is within normal limits. Prior TAVR is again noted. Aortic calcifications are seen. New patchy infiltrate is noted in the right base consistent with pneumonia. No bony abnormality is seen. IMPRESSION: Right basilar pneumonia. Electronically Signed   By: Inez Catalina M.D.   On: 01/18/2022 00:04    Procedures Procedures    Medications Ordered in ED Medications  lactated ringers infusion ( Intravenous New Bag/Given 01/18/22 0045)  lactated ringers bolus 1,000 mL (1,000 mLs Intravenous New Bag/Given 01/18/22 0039)    And  lactated ringers bolus 1,000 mL (1,000 mLs Intravenous New Bag/Given 01/18/22 0049)    And  lactated ringers bolus 500 mL (has no administration in time range)  ceFEPIme (MAXIPIME) 2 g in sodium chloride 0.9 % 100 mL IVPB (2 g Intravenous New Bag/Given 01/18/22 0043)  metroNIDAZOLE (FLAGYL) IVPB 500 mg (500 mg Intravenous New Bag/Given 01/18/22 0048)  vancomycin (VANCOREADY) IVPB 1500 mg/300 mL (  has no administration in time range)  sodium chloride 0.9 % bolus 500 mL (0 mLs Intravenous Stopped 01/18/22 0025)    ED Course/ Medical Decision Making/ A&P                           Medical Decision Making Patient with fall  and cough at home, some confusion.  On several pain medications  Amount and/or Complexity of Data Reviewed Independent Historian: spouse and EMS    Details: See above  External Data Reviewed: labs and notes.    Details: Previous notes reviewed  Labs: ordered.    Details: All labs reviewed:  white count very elevated 22.9, anemia 9.8, normal platelets.  Negative covid and flu.  Sodium low 131, normal potassium 5, elevated BUN 58, elevated creatinine 4.6.   Radiology: ordered and independent interpretation performed.    Details: PNA on CXR by me  ECG/medicine tests: ordered and independent interpretation performed. Decision-making details documented in ED Course.  Risk OTC drugs. Prescription drug management. Decision regarding hospitalization. Risk Details: In urinary retention.    Critical Care Total time providing critical care: 60 minutes (Sepsis bundle, multiple antibiotics and boluses. )   CRITICAL CARE Performed by: Caly Pellum K Jaeline Whobrey-Rasch Total critical care time: 60 minutes Critical care time was exclusive of separately billable procedures and treating other patients. Critical care was necessary to treat or prevent imminent or life-threatening deterioration. Critical care was time spent personally by me on the following activities: development of treatment plan with patient and/or surrogate as well as nursing, discussions with consultants, evaluation of patient's response to treatment, examination of patient, obtaining history from patient or surrogate, ordering and performing treatments and interventions, ordering and review of laboratory studies, ordering and review of radiographic studies, pulse oximetry and re-evaluation of patient's condition.  Final Clinical Impression(s) / ED Diagnoses Final diagnoses:  Sepsis with acute renal failure, due to unspecified organism, unspecified acute renal failure type, unspecified whether septic shock present [facility])   The patient appears  reasonably stabilized for admission considering the current resources, flow, and capabilities available in the ED at this time, and I doubt any other [facility] requiring further screening and/or treatment in the ED prior to admission.  Rx / DC Orders ED Discharge Orders     None         Glendi Mohiuddin, MD 01/18/22 0221

## 2022-01-18 NOTE — Progress Notes (Signed)
A consult was received from an ED physician for Vancomycin and Cefepime per pharmacy dosing.  The patient's profile has been reviewed for ht/wt/allergies/indication/available labs.    A one time order has been placed for Vancomycin '1500mg'$  IV and Cefepime 2gm IV.    Further antibiotics/pharmacy consults should be ordered by admitting physician if indicated.                       Thank you, Everette Rank, PharmD 01/18/2022  12:17 AM

## 2022-01-18 NOTE — ED Notes (Signed)
Pt advises being unable to provide urine sample at this time, will monitor. 

## 2022-01-18 NOTE — Telephone Encounter (Signed)
Patient wife called in because she stated that he is in the hospital and they are concerned about his pain meds and wondering if someone can speak w/ her about this matter since Dr.Lovorn is out.

## 2022-01-18 NOTE — Progress Notes (Signed)
Pharmacy Antibiotic Note  Brandon [name] is a 81 y.o. male with hx chronic left hip pain who presented to the ED on 01/17/2022 s/p fall with c/o dizziness and right rib pain.  CXT on 01/17/22 showed right basilar PNA. Pharmacy has been consulted to dose unasyn for aspiration PNA.  Today, 01/18/2022: - scr 4 ( crcl~ 16); was 0.73 on 09/01/21 - WBC 22.9 - vancomycin 1500 mg IV x1 given in the ED at 0142 on 12/14, flagyl 500 mg x1 at 0048 and cefepime 2gm x1 at 0043  Plan: - unasyn 3gm IV q12h - monitor rena; function closely and adjust dose if/when appropriate  ______________________________________    Temp (24hrs), Avg:97.8 F (36.6 C), Min:97.7 F (36.5 C), Max:97.9 F (36.6 C)  Recent Labs  Lab 01/17/22 2341 01/18/22 0006 01/18/22 0211  WBC 22.9*  --   --   CREATININE 4.60*  --  4.00*  LATICACIDVEN  --  1.2  --     CrCl cannot be calculated (Unknown ideal weight.).    Allergies  Allergen Reactions   Celebrex [Celecoxib] Hives and Itching   Tape Other (See Comments)    Tears skin. Prefers paper tape Please     Thank you for allowing pharmacy to be a part of this patient's care.  Lynelle Doctor 01/18/2022 8:50 AM

## 2022-01-18 NOTE — Progress Notes (Signed)
Modified Barium Swallow Progress Note  Patient Details  Name: Brandon [name] MRN: [id number] Date of Birth: Nov 21, 1940  Today's Date: 01/18/2022  Modified Barium Swallow completed.  Full report located under Chart Review in the Imaging Section.  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  Multiple swallows and liquid swallows effective to help protect his airway of retention.Pt did clear his throat and occasionally cough during MBS but he has not aspirated.   Mild laryngeal penetration of thin and nectar liquids noted - no aspiration occurred however.  Chin tuck posture with liquids helps to decrease amount of penetration - but did not prevent retention.   Cued expectoration effective to clear all of vallecular retention that pt did not transit into esophagus.   No nasal regurgitation observed today - but pt admits it occurs at home - if he leans over after intake.   Recommend Dys 3 diet and thin liquids with strict compliance to aspiration precautions - especially given suspected overt coughing/aspiration episode with liquids PTA.  Pt advises he does do his swallow exercises provided by SLP - but will re-engage these for maximal swallow function/airway protection.  Using teach back and review of flouro loops, pt educated to findings/recommendations. In addition, spoke to son and his wife on pt's cell phone reviewing the test results and importance of pt to cough/expectorate to protect his airway.   Swallow Evaluation Recommendations       SLP Diet Recommendations: Dysphagia 3 (Mech soft) solids;Thin liquid       Medication Administration: Other (Comment) (defer to pt - large cut to small pieces)   Supervision: Patient able to  self feed   Compensations: Small sips/bites;Slow rate;Multiple dry swallows after each bite/sip;Follow solids with liquid;Other (Comment) (" hock" and expectorate after intake)       Oral Care Recommendations: Oral care QID     Kathleen Lime, MS [facility] SLP Acute Rehab Services Office [phone] Pager [phone]    Macario Golds 01/18/2022,4:14 PM

## 2022-01-18 NOTE — H&P (Signed)
History and Physical    Patient: Brandon [name]:[id number] DOB: 29-Oct-1940 DOA: 01/17/2022 DOS: the patient was seen and examined on 01/18/2022 PCP: Haydee Salter, MD  Patient coming from: Home  Chief Complaint:  Chief Complaint  Patient presents with   Fall   HPI: Brandon [name] is a 81 y.o. male with medical history significant of with a past medical history of prior stent to LAD, HLD, COPD, hypertension, GERD and esophageal stricture, osteoarthritis and chronic pain of lumbar spine, chronic diastolic heart failure, BPH, anemia, history of treatment of tonsillar cancer, history of TAVR or who presented to the ER following at home because of increased dizziness and difficulty moving.  Patient reports that this usually happens when he is dehydrated.  Patient apparently had been on some sort of anticoagulant medication (?  Aspirin ) prior to admission and stopped 2 weeks ago in anticipation of injections in his lumbar spine region to help with pain.  She was complaining of significant pain in the right rib cage area upon presentation.  ER he was afebrile and initially hypertensive but after treatment for pain he was found to be somewhat hypotensive with a sitting blood pressure of 74 systolic and a standing blood pressure of 59.  He was given fluid bolus.  Initial sats were 92% related to splinting and he was started on nasal cannula oxygen.  Support white count was 22,900.  CT of the head and neck revealed no acute traumatic injuries but did reveal acute sinusitis.  Chest x-ray revealed right basilar pneumonia.  She was also found to have a significant acute kidney injury noting baseline creatinine 0.73 with presenting creatinine 4.6 and a BUN of 58, CO2 was normal, K slightly elevated at 5.0, he was also hyponatremic.  Platelets normal.  There was a left shift present.  Lactic acid was normal at 1.2.  Viral panels that included influenza RSV and SARS was negative. Alysis was slightly abnormal and  appeared consistent with dehydration.  Blood and urine cultures have been obtained.  Hospitalist service will admit for sepsis physiology related to pneumonia and likely associated sinusitis.   Review of Systems: As mentioned in the history of present illness. All other systems reviewed and are negative. 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 Burt Knack and Dr Cyndia Bent    Severe aortic stenosis    TIA (transient ischemic attack) 1990s   hx   tonsillar ca dx'd 11/2017   Tubular adenoma of colon 2009   Past Surgical History:  Procedure Laterality Date   ABDOMINAL AORTIC ENDOVASCULAR STENT GRAFT N/A 03/03/2021   Procedure: ABDOMINAL AORTIC ENDOVASCULAR STENT GRAFT;  Surgeon: Waynetta Sandy,  MD;  Location: [place];  Service: Vascular;  Laterality: N/A;   ABDOMINAL AORTOGRAM W/LOWER EXTREMITY N/A 04/10/2021   Procedure: ABDOMINAL AORTOGRAM W/LOWER EXTREMITY;  Surgeon: Waynetta Sandy, MD;  Location: [place] [facility];  Service: Cardiovascular;  Laterality: N/A;   BACK SURGERY     CARDIAC CATHETERIZATION     CATARACT EXTRACTION W/ INTRAOCULAR LENS  IMPLANT, BILATERAL Bilateral 01/2012 - 02/2012   CORONARY ATHERECTOMY N/A 04/27/2020   Procedure: CORONARY ATHERECTOMY;  Surgeon: Sherren Mocha, MD;   Location: [place] [facility];  Service: Cardiovascular;  Laterality: N/A;   CORONARY STENT INTERVENTION N/A 04/27/2020   Procedure: CORONARY STENT INTERVENTION;  Surgeon: Sherren Mocha, MD;  Location: [place] [facility];  Service: Cardiovascular;  Laterality: N/A;   DIRECT LARYNGOSCOPY Left 10/09/2017   Procedure: DIRECT LARYNGOSCOPY WITH BOPSY;  Surgeon: Jodi Marble, MD;  Location: [place];  Service: ENT;  Laterality: Left;   ESOPHAGOGASTRODUODENOSCOPY (EGD) WITH ESOPHAGEAL DILATION     "couple times" (07/19/2016)   ESOPHAGOSCOPY Left 10/09/2017   Procedure: ESOPHAGOSCOPY;  Surgeon: Jodi Marble, MD;  Location: [place];  Service: ENT;  Laterality: Left;   INTRAVASCULAR IMAGING/OCT N/A 04/27/2020   Procedure: INTRAVASCULAR IMAGING/OCT;  Surgeon: Sherren Mocha, MD;  Location: [place] [facility];  Service: Cardiovascular;  Laterality: N/A;   INTRAVASCULAR PRESSURE WIRE/FFR STUDY N/A 04/27/2020   Procedure: INTRAVASCULAR PRESSURE WIRE/FFR STUDY;  Surgeon: Sherren Mocha, MD;  Location: [place] [facility];  Service: Cardiovascular;  Laterality: N/A;   IR GASTROSTOMY TUBE MOD SED  01/08/2018   IR THORACENTESIS ASP PLEURAL SPACE W/IMG GUIDE  07/19/2016   LAPAROSCOPIC CHOLECYSTECTOMY  1984   LUMBAR [place] SURGERY  05/1996   L4-5; Dr. Claudean Kinds LAMINECTOMY/DECOMPRESSION MICRODISCECTOMY  10/2002   L3-4. Dr. Sherwood Gambler   MULTIPLE TOOTH EXTRACTIONS  1980s   PARTIAL GLOSSECTOMY  12/02/2017   Dr. Nicolette [facility]- [facility]   pharyngoplasty for closure of tingue base defect  12/02/2017   Dr. Nicolette Bang- [place]  07/15/2016   POSTERIOR LUMBAR FUSION  09/1996   Ray cage, L4-5 Dr. Rita Ohara   PROSTATE BIOPSY  ~ 2017   radical tonsillectomy Left 12/02/2017   Dr. Nicolette Bang at [place] N/A 03/31/2020   Procedure: RIGHT HEART CATH AND CORONARY ANGIOGRAPHY;  Surgeon: Larey Dresser, MD;  Location: [place] [facility];  Service: Cardiovascular;  Laterality: N/A;   RIGID BRONCHOSCOPY Left 10/09/2017   Procedure: RIGID BRONCHOSCOPY;  Surgeon: Jodi Marble, MD;  Location: [place];  Service: ENT;  Laterality: Left;   TEE WITHOUT CARDIOVERSION N/A 06/07/2020   Procedure: TRANSESOPHAGEAL ECHOCARDIOGRAM (TEE);  Surgeon: Sherren Mocha, MD;  Location: [place];  Service: Open Heart Surgery;  Laterality: N/A;   TONSILLECTOMY     TOTAL HIP ARTHROPLASTY Left 12/04/2020   Procedure: TOTAL HIP ARTHROPLASTY ANTERIOR APPROACH;  Surgeon: Rod Can, MD;  Location: [facility];  Service: Orthopedics;  Laterality: Left;   TOTAL HIP ARTHROPLASTY Left 11/2020   TRACHEOSTOMY  12/02/2017   Dr. [facility]- [facility]   ULTRASOUND GUIDANCE FOR VASCULAR ACCESS Right 06/07/2020   Procedure: ULTRASOUND GUIDANCE FOR VASCULAR ACCESS;  Surgeon: Sherren Mocha, MD;  Location: [place];  Service: Open Heart Surgery;  Laterality: Right;   ULTRASOUND GUIDANCE FOR VASCULAR ACCESS Bilateral 03/03/2021   Procedure: ULTRASOUND GUIDANCE FOR VASCULAR ACCESS, BILATERAL FEMORAL ARTERIES;  Surgeon: Waynetta Sandy, MD;  Location: [place];  Service: Vascular;  Laterality: Bilateral;   VASCULAR SURGERY     Social History:  reports that he quit smoking about 4 years ago. His smoking use included cigarettes. He has a 75.00 pack-year smoking history. He has never been exposed to tobacco smoke. He has never used smokeless tobacco. He reports current alcohol use. He reports current drug use. Drug: Oxycodone.  Allergies  Allergen Reactions   Celebrex [Celecoxib] Hives and Itching   Tape Other (See Comments)    Tears skin. Prefers paper tape Please    Family History  Problem Relation Age of Onset   Heart disease Father 75       Living   Coronary artery disease Father        CABG   Alzheimer's disease Mother 43       Deceased   Arthritis Mother    Aneurysm Brother    Stomach cancer Maternal Uncle    Brain cancer Maternal Aunt         x2   Obesity Daughter        Had Bypass Sx    Prior to Admission medications   Medication Sig Start Date End Date Taking? Authorizing Provider  acetaminophen (TYLENOL) 500 MG tablet Take 1,000 mg by mouth every 8 (eight) hours as needed for mild pain.    [provider]  albuterol (VENTOLIN HFA) 108 (90 Base) MCG/ACT inhaler INHALE 2 PUFFS INTO THE LUNGS EVERY 6 HOURS AS NEEDED FOR WHEEZING OR SHORTNESS OF BREATH 10/19/21   Haydee Salter, MD  allopurinol (ZYLOPRIM) 100 MG tablet TAKE 2 TABLETS(200 MG) BY MOUTH DAILY 10/10/21   Ofilia Neas, PA-C  amLODipine (NORVASC) 10 MG tablet TAKE 1 TABLET(10 MG) BY MOUTH DAILY 06/26/21   Larey Dresser, MD  aspirin EC 81 MG tablet Take 81 mg by mouth daily. Swallow whole.    [provider]  atorvastatin (LIPITOR) 80 MG tablet Take 1 tablet (80 mg total) by mouth daily. 03/21/21   Larey Dresser, MD  carvedilol (COREG) 12.5 MG tablet Take 1 tablet (12.5 mg total) by mouth 2 (two) times daily. 03/24/21 03/24/22  Larey Dresser, MD  Cholecalciferol 25 MCG (1000 UT) tablet Take 1,000 Units by mouth daily.    [provider]  ezetimibe (ZETIA) 10 MG tablet Take 1 tablet (10 mg total) by mouth daily. 12/04/21 12/04/22  Larey Dresser, MD  FLUoxetine (PROZAC) 40 MG capsule Take 1 capsule (40 mg total) by mouth daily. 10/06/21   Lovorn, Jinny Blossom, MD  fluticasone (FLONASE) 50 MCG/ACT nasal spray SHAKE LIQUID AND USE 2 SPRAYS IN EACH NOSTRIL DAILY AS NEEDED FOR RHINITIS OR ALLERGIES 11/14/20   Haydee Salter, MD  furosemide (LASIX) 20 MG tablet Take 1 tablet (20 mg total) by mouth daily. 11/08/21 02/06/22  Larey Dresser, MD  gabapentin (NEURONTIN) 800 MG tablet Take 1 tablet (800 mg total) by mouth 3 (three) times daily. Can increase to 4x/day if needed after 3 days- for nerve pain 12/08/21   Lovorn, Jinny Blossom, MD  ipratropium (ATROVENT) 0.02 % nebulizer solution Use 1 vial (0.5 mg total) by nebulization every 6 (six) hours as needed for  wheezing or shortness of breath. 08/21/21   Raiford Noble Latif, DO  ipratropium (ATROVENT) 0.03 % nasal spray Place 2 sprays into both nostrils every 12 (twelve) hours. 01/24/21   Haydee Salter, MD  levalbuterol Penne Lash) 0.63 MG/3ML nebulizer solution Use 1 vial (0.63 mg total) by nebulization every 6 (six) hours as needed for wheezing  or shortness of breath. 08/21/21   Raiford Noble Latif, DO  lisinopril (ZESTRIL) 20 MG tablet TAKE 1 TABLET BY MOUTH DAILY 11/08/21   [place], Maricela Bo, FNP  morphine (MS CONTIN) 15 MG 12 hr tablet Take 1 tablet (15 mg total) by mouth every 12 (twelve) hours. For chronic pain- severe DJD 12/08/21   Lovorn, Jinny Blossom, MD  Oxycodone HCl 10 MG TABS Take 1 tablet (10 mg total) by mouth every 4 (four) hours as needed. Can be refilled as of 01/10/22- for chronic pain 01/12/22   [name], MD  pantoprazole (PROTONIX) 40 MG tablet Take 1 tablet (40 mg total) by mouth daily. 06/30/21 10/18/21  [place] Donnamarie Poag, DO  potassium chloride (KLOR-CON) 10 MEQ tablet Take 2 tablets (20 mEq total) by mouth 2 (two) times daily. 05/01/21   [place], Maricela Bo, FNP  predniSONE (DELTASONE) 5 MG tablet TAKE 1 TABLET(5 MG) BY MOUTH DAILY WITH BREAKFAST 01/03/22   Bo Merino, MD  tamsulosin (FLOMAX) 0.4 MG CAPS capsule Take 0.4 mg by mouth in the morning and at bedtime. 12/23/18   [provider]  Tiotropium Bromide Monohydrate (SPIRIVA RESPIMAT) 2.5 MCG/ACT AERS Inhale 2 puffs into the lungs daily. 01/24/21   Haydee Salter, MD  traMADol (ULTRAM) 50 MG tablet Take 2 tablets (100 mg total) by mouth 2 (two) times daily. 10/06/21   Lovorn, Jinny Blossom, MD  trolamine salicylate (ASPERCREME) 10 % cream Apply 1 Application topically 2 (two) times daily as needed (arthritis pain).    [provider]    Physical Exam: Vitals:   01/18/22 0800 01/18/22 0815 01/18/22 0830 01/18/22 0845  BP:  112/65 118/63 102/64  Pulse:  81 77 78  Resp:  (!) 23 20 (!) 23  Temp: 97.9 F (36.6 C)      TempSrc: Oral     SpO2:  100% 97% 97%   Constitutional: NAD, calm, uncomfortable to acute right lateral rib cage pain as well as chronic lumbar sacral pain. Eyes: PERRL, lids and conjunctivae normal ENMT: Mucous membranes are dry. Posterior pharynx clear of any exudate or lesions.Normal dentition.  Neck: normal, supple, no masses, no thyromegaly Respiratory: clear to auscultation bilaterally, no wheezing, no crackles. Normal respiratory effort. No accessory muscle use.  Lungs are decreased in the bases secondary to splinting from rib pain patient essentially lying flat on back due to pain.  Pain reproducible with minimal palpation of her right lateral rib cage. Cardiovascular: Regular rate and rhythm, no murmurs / rubs / gallops. No extremity edema. 2+ pedal pulses. No carotid bruits.  SBP between 102 and 126 Abdomen: no tenderness, no masses palpated. No obvious hepatosplenomegaly. Bowel sounds positive.  Musculoskeletal: no clubbing / cyanosis. No joint deformity upper and lower extremities. Good ROM, no contractures. Normal muscle tone.  Skin: no rashes, lesions, ulcers. No induration Neurologic: CN 2-12 grossly intact. Sensation intact, DTR normal. Strength 5/5 x all 4 extremities.  Psychiatric: Normal judgment and insight. Alert and oriented x 3. Normal mood.   Data Reviewed:  Repeat labs since initial ER evaluation: Sodium 132, potassium 5.1, glucose 98, BUN 56, creatinine 4.00, calcium 7.7, albumin 2.1, LFTs are normal except for total protein 4.6, GFR 15; initial labs as noted reveal an elevated white count, hemoglobin 9.8 with hematocrit 32.6, platelets 201,000  CT head and cervical spine as well as chest x-ray as in the HPI  Assessment and Plan: Sepsis secondary to pneumonia Managing revealed both right basilar pneumonia as well as acute sinusitis Continue Unasyn  Follow-up on blood and urine cultures Continue IV fluid hydration Sepsis physiology criteria as follows: White count  greater than 20,000, tachycardia and hypotension that has improved with volume, symptoms of low perfusion as evidenced by acute kidney injury Check procalcitonin continue to trend lactic acid  Acute kidney injury Baseline creatinine 0.73 with presenting creatinine 4.60 Prerenal in etiology Does not appear to be obstructive at this juncture but will continue to follow-with Foley catheter in place with good urine output noting initial urine was dark amber and urine now is light amber with hydration Obtain CK Avoid offending nephrotoxic medications Follow labs Reason to obtain renal ultrasound at this juncture  Hypertension/history of chronic diastolic heart failure Given acute kidney injury and dehydration will hold preadmission ACE inhibitor and diuretic Patient also with some nausea and vomiting prior to presentation so we will utilize as needed IV hydralazine for hypertensive control She was also on carvedilol prior to admission so we will utilize IV Lopressor scheduled to prevent rebound tachycardia secondary to beta-blocker withdrawal  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 Currently unable to tolerate oral medications due to recurrent nausea and vomiting One-time dose of IV Dilaudid in ER and once out of the ER will place patient on PCA Dilaudid Last as needed IV Dilaudid while in the ER  COPD Provide as needed nebs Low-dose oxygen in context of hypoventilation from right lateral rib pain Spirometry Treat underlying causes of pain  PolyMyalgia rheumatica On low-dose prednisone-continue if can tolerate with sips of fluid   Advance Care Planning:   Code Status: Full Code discussed with patient at the bedside in presence of his nurse and patient confirmed that he does wish for CPR and short-term ventilator placement if indicated  DVT prophylaxis: Heparin  Consults: None  Family Communication: Patient at bedside  Severity of Illness: The appropriate patient status  for this patient is INPATIENT. Inpatient status is judged to be reasonable and necessary in order to provide the required intensity of service to ensure the patient's safety. The patient's presenting symptoms, physical exam findings, and initial radiographic and laboratory data in the context of their chronic comorbidities is felt to place them at high risk for further clinical deterioration. Furthermore, it is not anticipated that the patient will be medically stable for discharge from the hospital within 2 midnights of admission.   * I certify that at the point of admission it is my clinical judgment that the patient will require inpatient hospital care spanning beyond 2 midnights from the point of admission due to high intensity of service, high risk for further deterioration and high frequency of surveillance required.*  Author: Erin Hearing, NP 01/18/2022 8:51 AM  For on call review [email or web address].

## 2022-01-18 NOTE — Sepsis Progress Note (Signed)
Elink monitoring for the code sepsis protocol.  

## 2022-01-19 ENCOUNTER — Other Ambulatory Visit: Payer: Self-pay | Admitting: Physician Assistant

## 2022-01-19 DIAGNOSIS — A419 Sepsis, unspecified organism: Secondary | ICD-10-CM | POA: Diagnosis not present

## 2022-01-19 DIAGNOSIS — I5032 Chronic diastolic (congestive) heart failure: Secondary | ICD-10-CM

## 2022-01-19 DIAGNOSIS — C090 Malignant neoplasm of tonsillar fossa: Secondary | ICD-10-CM

## 2022-01-19 DIAGNOSIS — Z9189 Other specified personal risk factors, not elsewhere classified: Secondary | ICD-10-CM

## 2022-01-19 DIAGNOSIS — N400 Enlarged prostate without lower urinary tract symptoms: Secondary | ICD-10-CM

## 2022-01-19 DIAGNOSIS — E785 Hyperlipidemia, unspecified: Secondary | ICD-10-CM

## 2022-01-19 DIAGNOSIS — J690 Pneumonitis due to inhalation of food and vomit: Secondary | ICD-10-CM | POA: Diagnosis not present

## 2022-01-19 LAB — RENAL FUNCTION PANEL
Albumin: 2.2 g/dL — ABNORMAL LOW (ref 3.5–5.0)
Anion gap: 9 (ref 5–15)
BUN: 29 mg/dL — ABNORMAL HIGH (ref 8–23)
CO2: 22 mmol/L (ref 22–32)
Calcium: 7.9 mg/dL — ABNORMAL LOW (ref 8.9–10.3)
Chloride: 110 mmol/L (ref 98–111)
Creatinine, Ser: 0.86 mg/dL (ref 0.61–1.24)
GFR, Estimated: >60 mL/min (ref >60)
Glucose, Bld: 85 mg/dL (ref 70–99)
Phosphorus: 3 mg/dL (ref 2.5–4.6)
Potassium: 4.3 mmol/L (ref 3.5–5.1)
Sodium: 141 mmol/L (ref 135–145)

## 2022-01-19 LAB — CBC
HCT: 24.9 % — ABNORMAL LOW (ref 39.0–52.0)
Hemoglobin: 7.5 g/dL — ABNORMAL LOW (ref 13.0–17.0)
MCH: 26.2 pg (ref 26.0–34.0)
MCHC: 30.1 g/dL (ref 30.0–36.0)
MCV: 87.1 fL (ref 80.0–100.0)
Platelets: 111 10*3/uL — ABNORMAL LOW (ref 150–400)
RBC: 2.86 MIL/uL — ABNORMAL LOW (ref 4.22–5.81)
RDW: 14.4 % (ref 11.5–15.5)
WBC: 9.1 10*3/uL (ref 4.0–10.5)
nRBC: 0 % (ref 0.0–0.2)

## 2022-01-19 LAB — VITAMIN B12: Vitamin B-12: 403 pg/mL (ref 180–914)

## 2022-01-19 LAB — URINE CULTURE
Culture: NO GROWTH
Special Requests: NORMAL

## 2022-01-19 LAB — FERRITIN: Ferritin: 147 ng/mL (ref 24–336)

## 2022-01-19 LAB — RETICULOCYTES
Immature Retic Fract: 13.5 % (ref 2.3–15.9)
RBC.: 2.86 MIL/uL — ABNORMAL LOW (ref 4.22–5.81)
Retic Count, Absolute: 43.8 10*3/uL (ref 19.0–186.0)
Retic Ct Pct: 1.5 % (ref 0.4–3.1)

## 2022-01-19 LAB — MAGNESIUM: Magnesium: 1.7 mg/dL (ref 1.7–2.4)

## 2022-01-19 LAB — FOLATE: Folate: 7.3 ng/mL (ref >5.9)

## 2022-01-19 LAB — IRON AND TIBC
Iron: 11 ug/dL — ABNORMAL LOW (ref 45–182)
Saturation Ratios: 7 % — ABNORMAL LOW (ref 17.9–39.5)
TIBC: 170 ug/dL — ABNORMAL LOW (ref 250–450)
UIBC: 159 ug/dL

## 2022-01-19 MED ORDER — ENSURE MAX PROTEIN PO LIQD
11.0000 [oz_av] | Freq: Two times a day (BID) | ORAL | Status: DC
  Administered 2022-01-19 – 2022-01-20 (×2): 11 [oz_av] via ORAL
  Filled 2022-01-19 (×3): qty 330

## 2022-01-19 MED ORDER — ASPIRIN 81 MG PO TBEC
81.0000 mg | DELAYED_RELEASE_TABLET | Freq: Every day | ORAL | Status: DC
  Administered 2022-01-19: 81 mg via ORAL
  Filled 2022-01-19: qty 1

## 2022-01-19 MED ORDER — UMECLIDINIUM BROMIDE 62.5 MCG/ACT IN AEPB
1.0000 | INHALATION_SPRAY | Freq: Every day | RESPIRATORY_TRACT | Status: DC
  Administered 2022-01-20: 1 via RESPIRATORY_TRACT
  Filled 2022-01-19: qty 7

## 2022-01-19 MED ORDER — SODIUM CHLORIDE 0.9 % IV SOLN
3.0000 g | Freq: Four times a day (QID) | INTRAVENOUS | Status: DC
  Administered 2022-01-19 – 2022-01-20 (×6): 3 g via INTRAVENOUS
  Filled 2022-01-19 (×6): qty 8

## 2022-01-19 MED ORDER — GABAPENTIN 400 MG PO CAPS
800.0000 mg | ORAL_CAPSULE | Freq: Two times a day (BID) | ORAL | Status: DC
  Administered 2022-01-20: 800 mg via ORAL
  Filled 2022-01-19 (×2): qty 2

## 2022-01-19 MED ORDER — TIOTROPIUM BROMIDE MONOHYDRATE 2.5 MCG/ACT IN AERS: 2.0000 | INHALATION_SPRAY | Freq: Every day | RESPIRATORY_TRACT | Status: DC

## 2022-01-19 MED ORDER — CARVEDILOL 3.125 MG PO TABS
3.1250 mg | ORAL_TABLET | Freq: Two times a day (BID) | ORAL | Status: DC
  Administered 2022-01-19 – 2022-01-20 (×2): 3.125 mg via ORAL
  Filled 2022-01-19 (×2): qty 1

## 2022-01-19 MED ORDER — TAMSULOSIN HCL 0.4 MG PO CAPS
0.4000 mg | ORAL_CAPSULE | Freq: Every day | ORAL | Status: DC
  Administered 2022-01-19: 0.4 mg via ORAL
  Filled 2022-01-19: qty 1

## 2022-01-19 MED ORDER — ALLOPURINOL 100 MG PO TABS
100.0000 mg | ORAL_TABLET | Freq: Two times a day (BID) | ORAL | Status: DC
  Administered 2022-01-19 – 2022-01-20 (×2): 100 mg via ORAL
  Filled 2022-01-19 (×2): qty 1

## 2022-01-19 MED ORDER — ATORVASTATIN CALCIUM 40 MG PO TABS
80.0000 mg | ORAL_TABLET | Freq: Every day | ORAL | Status: DC
  Filled 2022-01-19: qty 2

## 2022-01-19 MED ORDER — ALUM & MAG HYDROXIDE-SIMETH 200-200-20 MG/5ML PO SUSP
30.0000 mL | ORAL | Status: DC | PRN
  Administered 2022-01-19: 30 mL via ORAL
  Filled 2022-01-19: qty 30

## 2022-01-19 MED ORDER — MAGNESIUM SULFATE IN D5W 1-5 GM/100ML-% IV SOLN
1.0000 g | Freq: Once | INTRAVENOUS | Status: AC
  Administered 2022-01-19: 1 g via INTRAVENOUS
  Filled 2022-01-19: qty 100

## 2022-01-19 NOTE — Progress Notes (Signed)
Mobility Specialist - Progress Note  Pre-mobility: 73 bpm HR, 97% SpO2 During mobility: 90 bpm HR, 88-98% SpO2 Post-mobility: 76 bpm HR, 96% SPO2   01/19/22 1500  Oxygen Therapy  O2 Device Room Air  Mobility  Activity Ambulated with assistance in hallway  Level of Assistance Minimal assist, patient does 75% or more  Assistive Device Front wheel walker  Distance Ambulated (ft) 130 ft  Range of Motion/Exercises Active  Activity Response Tolerated well  Mobility Referral Yes  $Mobility charge 1 Mobility   Pt was found in bed and agreeable to ambulate. Pt was min-A from lying>sitting EOB and contact guard for sit>stand and ambulation. Was a bit unsteady getting up from bed but was able to become steady and ambulate. During ambulation stayed above 90% SPO2 for most of the session but when talking pt would desat to 88%. Pt was encouraged to practice pursed lipped breathing during session and was able to bring up SPO2 >90% within seconds. At EOS returned to recliner chair with necessities in reach and RN notified.  Ferd Hibbs Mobility Specialist

## 2022-01-19 NOTE — Telephone Encounter (Signed)
Drug and alcohol assessment for this visit is consistent with expectations.

## 2022-01-19 NOTE — Progress Notes (Signed)
Speech Language Pathology Treatment: Dysphagia  Patient Details Name: Brandon [name] MRN: [id number] DOB: 02/02/1941 Today's Date: 01/19/2022 Time: 0626-9485 SLP Time Calculation (min) (ACUTE ONLY): 21 min  Assessment / Plan / Recommendation Clinical Impression  Pt coughed and expectorated post-swallow - He reports this to be a chronic occurence for him. Admits first bolus of tea likely "went down the wrong way". Tolerance of Ensure Max Protein clinically judged to be adequate. Advised pt maintain strong of cough/expectoration for airway protection. Using teach back, pt able to articulate his precautions and reasoning why - stating "Give me a break, lady". Mandibular ROM impaired and thus SlP will measure to create trismus apparatus for swallow/speech function. Pt agreeable to plan. Messaged MD regarding pt's poor intake - and requested he order Ensure Max for pt *he consumes this at home. Using teach back, pt educated to importance of continuing po intake, adequacy of nutrition *even if just liquid at this time due to his acute illness*, need to continue his massages, stretches and swallowing exercises to decrease tissue fibrosis from XRT that may negatively impact his swallow. Please provide medications with Ensure - 1/2 if large.     HPI HPI: Per MD note "Brandon [name] is a 81 y.o. male with medical history significant of with a past medical history of prior stent to LAD, HLD, ? brain stem CVA vs TIA, COPD, hypertension, GERD and esophageal stricture, osteoarthritis and chronic pain of lumbar spine, chronic diastolic heart failure, BPH, anemia, history of treatment of tonsillar cancer, history of TAVR or who presented to the ER following at home because of increased dizziness and difficulty moving.  Patient reports that this usually happens when he is dehydrated.   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   Scar tissue removal, tonsillar/lingual ca (s/p transhyoid/transcervical resection of the tonsil and tongue base with left neck dissection on 12/02/17. 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, requiring two returns to the OR;  scar tissue release and tongue repair with [facility] esophageal dilation). Pt underwent MBS in 08/2021 and was advised to consume dys3/thin diet - following solid with liquids and throat clearing.  Pt admits to recent "choking" episode prior to this admission on liquids.  He cuts his pill in half - as he recalls choking on pill in 08/2021.      SLP Plan  Continue with current plan of care      Recommendations for follow up therapy are one component of a multi-disciplinary discharge planning process, led by the attending physician.  Recommendations may be updated based on patient status, additional functional criteria and insurance authorization.    Recommendations  Diet recommendations: Dysphagia 3 (mechanical soft);Thin liquid Liquids provided via: Straw;Cup Medication Administration: Other (Comment) (with Ensure) Supervision: Staff to assist with self feeding Compensations: Small sips/bites;Slow rate;Multiple dry swallows after each bite/sip;Follow solids with liquid;Other (Comment) Postural Changes and/or Swallow Maneuvers: Seated upright 90 degrees;Upright 30-60 min after meal                Follow Up Recommendations: Home health SLP Assistance recommended at discharge: Intermittent Supervision/Assistance SLP Visit Diagnosis: Dysphagia, pharyngoesophageal phase (R13.14) Plan: Continue with current plan of care          Kathleen Lime, MS [place] Office [phone] Pager [phone]  Brandon [name]  01/19/2022, 5:35 PM

## 2022-01-19 NOTE — Progress Notes (Signed)
SATURATION QUALIFICATIONS: (This note is used to comply with regulatory documentation for home oxygen)  Patient Saturations on Room Air at Rest = 98%  Patient Saturations on Room Air while Ambulating = 94%  Patient Saturations on NA Liters of oxygen while Ambulating = NA%  Please briefly explain why patient needs home oxygen:

## 2022-01-19 NOTE — Progress Notes (Signed)
Pharmacy Antibiotic Note  Brandon [name] is a 81 y.o. male with hx chronic left hip pain who presented to the ED on 01/17/2022 s/p fall with c/o dizziness and right rib pain.  CXR on 01/17/22 showed right basilar PNA. Pharmacy has been consulted to dose unasyn for aspiration PNA.  Today, 01/19/2022: - scr 4.6>>>0.86, AKI resolved - WBC 22.9>>> 9.1, now WNL  Plan: - increase unasyn to 3gm IV q6h Consider changing to augmentin suspension ______________________________________    Temp (24hrs), Avg:98.1 F (36.7 C), Min:97.8 F (36.6 C), Max:98.7 F (37.1 C)  Recent Labs  Lab 01/17/22 2341 01/18/22 0006 01/18/22 0211 01/18/22 0930 01/19/22 0415  WBC 22.9*  --   --  12.1* 9.1  CREATININE 4.60*  --  4.00* 2.43* 0.86  LATICACIDVEN  --  1.2  --   --   --      Estimated Creatinine Clearance: 67.4 mL/min (by C-G formula based on SCr of 0.86 mg/dL).    Allergies  Allergen Reactions   Tape Other (See Comments)    Tears skin. Prefers paper tape, Please   Celebrex [Celecoxib] Hives and Itching    12/14 cefepime, flagyl, vanc x1 12/14 unasyn>>  12/14 UCx: sent 12/14 BCx2 ngtd  Thank you for allowing pharmacy to be a part of this patient's care.  Eudelia Bunch, Pharm.D Use secure chat for questions 01/19/2022 7:20 AM

## 2022-01-19 NOTE — Telephone Encounter (Signed)
Ms. Granillo was informed that when Mr Riner is discharged, call our office for a phone visit to discuss weaning off the medication, Ms. Meth expressed today that they are concerned he will overdose.

## 2022-01-19 NOTE — Progress Notes (Addendum)
PROGRESS NOTE  Brandon [name] TFT:[id number] DOB: [birth year]   PCP: Haydee Salter, MD  Patient is from: Home.  Lives with his wife.  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 intake, nausea, emesis, dizziness, weakness and fall, and admitted for severe sepsis due to aspiration pneumonia and AKI.  CXR showed right basilar infiltrate. Cr 4.6 (baseline 0.7).  Started on IV fluid and IV Unasyn.    Subjective: Seen and examined earlier this morning.  No major events overnight of this morning.  No complaints other than some pain in his right chest wall.  Back pain fairly controlled with current regimen.  Patient's wife at bedside.  She is concerned about his pain medications and risk of accidental overdose.   Objective: Vitals:   01/19/22 0152 01/19/22 0518 01/19/22 0959 01/19/22 1240  BP: 133/68 129/67 (!) 150/65 (!) 152/70  Pulse: 80 80 78 77  Resp: (!) '22 20 17 18  '$ Temp: 97.8 F (36.6 C) 98.7 F (37.1 C) 98.1 F (36.7 C) 98.1 F (36.7 C)  TempSrc: Oral Oral Oral Oral  SpO2: 95% 98% 98% 98%  Weight:      Height:        Examination:  GENERAL: No apparent distress.  Nontoxic. HEENT: MMM.  Vision and hearing grossly intact.  NECK: Supple.  No apparent JVD.  RESP:  No IWOB.  Fair aeration bilaterally.  Crackles over right lung base. CVS:  RRR. Heart sounds normal.  ABD/GI/GU: BS+. Abd soft, NTND.  Indwelling Foley. MSK/EXT:  Moves extremities. No apparent deformity. No edema.  SKIN: no apparent skin lesion or wound NEURO: Awake, alert and oriented appropriately.  No  apparent focal neuro deficit. PSYCH: Calm. Normal affect.   Procedures:  None  Microbiology summarized: [id number], influenza and RSV PCR nonreactive Blood and urine cultures NGTD.  Assessment and plan: Principal Problem:   Severe sepsis ([place]) Active Problems:   Hyperlipidemia   History of benign prostatic hyperplasia   Essential hypertension   Cancer of tonsillar fossa (HCC)   Sciatica associated with disorder of lumbar spine   Chronic pain syndrome   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HCC)   Esophageal stricture   History of transcatheter aortic valve replacement (TAVR)   Chronic obstructive pulmonary disease (HCC)   History of placement of stent in LAD coronary artery   Benign prostatic hyperplasia   Chronic diastolic heart failure (HCC)   Lumbar postlaminectomy syndrome   Hypotension   Oropharyngeal dysphagia   Aspiration pneumonia (HCC)  Severe sepsis due to aspiration pneumonia: POA.  Has leukocytosis, tachycardia, tachypnea, hypotension and AKI on presentation.  CXR suggesting RLL infiltrate.  He had an episode of emesis.  Also have history of tonsillar cancer and esophageal stricture although he denies dysphagia.  He is on very high-dose narcotics and gabapentin. Blood and urine cultures NGTD.  Sepsis physiology resolved. -Wean off oxygen. -Transfer to MedSurg floor -Continue IV Unasyn -Aspiration precaution -SLP-dysphagia 3 diet -Decrease IV fluid. -Extensive discussion about risk of polypharmacy with patient and patient's wife at bedside   Acute kidney injury/azotemia: Multifactorial including dehydration from poor p.o. intake, concurrent use  of diuretics, lisinopril and sepsis.  Had a Foley placed in ED but no report of urinary retention.  AKI resolved. Recent Labs    08/17/21 1811 08/17/21 1820 08/18/21 0239 08/19/21 0026 08/20/21 0931 08/21/21 0058 09/01/21 1200 01/17/22 2341 01/18/22 0211 01/18/22 0930 01/19/22 0415  BUN 63* 69* 59* 36* 41* 44* 16 58* 56*   --  29*  CREATININE 2.46* 2.60* 1.78* 0.86 0.92 1.05 0.73 4.60* 4.00* 2.43* 0.86  -Decrease IV fluid. -Voiding trial -Recheck in the morning   Chronic diastolic heart failure: Appears euvolemic.   -Decrease IV fluid. -Continue holding the radix -Strict intake and output   Chronic lumbosacral pain on chronic narcotics-takes MS Contin 15 mg twice daily, oxy IR 10 mg every 4 hours as needed, tramadol 100 mg every 12 hours, gabapentin 800 mg 3 times daily.  At serious risk for polypharmacy.  Patient's wife is very concerned about this.  Pain fairly controlled on the regimen below. -Hold MS Contin given renal failure.  OxyContin 10 mg twice daily instead -Resumed Oxy IR 10 mg every 6 hours as needed for severe pain.  Takes every 4 hours as needed at home. -Tramadol 50 mg every 8 hours as needed for moderate pain.  On 100 mg at home. -Resume home gabapentin at reduced dose. -P.o. Tylenol as needed for mild pain -IV Dilaudid for breakthrough pain -PT/OT eval -Discussed risk of polypharmacy and encouraged patient and his wife to discuss with his pain clinician.  Meanwhile, I encouraged patient to space out on Oxy IR.    Hypotension/history of hypertension: Multifactorial including sepsis, dehydration, possible adrenal insufficiency and iatrogenic from antihypertensive meds.  Resolved. -Decrease IV fluid -Restart Coreg at lower dose -Continue holding amlodipine, Lasix and lisinopril  At risk for polypharmacy: See chronic pain   BPH with urinary retention: Foley inserted in ED. no mention of urine retention. -Voiding trial today -Continue home Flomax. -Bladder scan as needed   Hyponatremia:  Likely from dehydration and AKI.  Resolved. -Continue monitoring   COPD: Stable. -Nebs as needed  Anemia of chronic disease/thrombocytopenia: Stable. Recent Labs    08/18/21 0518 08/18/21 0548 08/18/21 1642 08/19/21 0026 08/20/21 0931 08/21/21 0058 09/01/21 1200 01/17/22 2341  01/18/22 0930 01/19/22 0415  HGB 8.0* 8.2* 8.5* 8.1* 8.1* 8.0* 10.2* 9.8* 7.9* 7.5*  -Monitor    PolyMyalgia rheumatica: On low-dose prednisone -Continue home prednisone 5 mg daily  Body mass index is 26.21 kg/m.           DVT prophylaxis:  heparin injection 5,000 Units Start: 01/18/22 1400  Code Status: Full code Family Communication: Updated patient's wife at bedside Level of care: Progressive.  Change level of care to MedSurg Status is: Inpatient Remains inpatient appropriate because: Severe sepsis due to aspiration pneumonia   Final disposition: Likely home after PT/OT eval Consultants:  None  Sch Meds:  Scheduled Meds:  heparin  5,000 Units Subcutaneous Q8H   lidocaine  2 patch Transdermal Q24H   metoprolol tartrate  2.5 mg Intravenous Q6H   oxyCODONE  10 mg Oral Q12H   predniSONE  5 mg Oral Q breakfast   senna-docusate  1 tablet Oral BID   sodium chloride flush  3 mL Intravenous Q12H   Continuous Infusions:  sodium chloride 125 mL/hr at 01/19/22 1509   ampicillin-sulbactam (UNASYN) IV 3 g (01/19/22 1317)   PRN Meds:.acetaminophen **OR** acetaminophen, albuterol, HYDROmorphone (DILAUDID) injection, ipratropium-albuterol, oxyCODONE, polyethylene glycol, traMADol  Antimicrobials: Anti-infectives (From admission, onward)    Start  Dose/Rate Route Frequency Ordered Stop   01/19/22 0800  Ampicillin-Sulbactam (UNASYN) 3 g in sodium chloride 0.9 % 100 mL IVPB        3 g 200 mL/hr over 30 Minutes Intravenous Every 6 hours 01/19/22 0716     01/18/22 1300  Ampicillin-Sulbactam (UNASYN) 3 g in sodium chloride 0.9 % 100 mL IVPB  Status:  Discontinued        3 g 200 mL/hr over 30 Minutes Intravenous Every 12 hours 01/18/22 0843 01/19/22 0716   01/18/22 0030  vancomycin (VANCOREADY) IVPB 1500 mg/300 mL        1,500 mg 150 mL/hr over 120 Minutes Intravenous  Once 01/18/22 0017 01/18/22 0706   01/18/22 0015  ceFEPIme (MAXIPIME) 2 g in sodium chloride 0.9 % 100 mL  IVPB        2 g 200 mL/hr over 30 Minutes Intravenous  Once 01/18/22 0007 01/18/22 0116   01/18/22 0015  metroNIDAZOLE (FLAGYL) IVPB 500 mg        500 mg 100 mL/hr over 60 Minutes Intravenous  Once 01/18/22 0007 01/18/22 0134   01/18/22 0015  vancomycin (VANCOCIN) IVPB 1000 mg/200 mL premix  Status:  Discontinued        1,000 mg 200 mL/hr over 60 Minutes Intravenous  Once 01/18/22 0007 01/18/22 0016        I have personally reviewed the following labs and images: CBC: Recent Labs  Lab 01/17/22 2341 01/18/22 0930 01/19/22 0415  WBC 22.9* 12.1* 9.1  NEUTROABS 20.6*  --   --   HGB 9.8* 7.9* 7.5*  HCT 32.6* 25.7* 24.9*  MCV 86.0 86.2 87.1  PLT 201 118* 111*   BMP &GFR Recent Labs  Lab 01/17/22 2341 01/18/22 0211 01/18/22 0930 01/19/22 0415  NA 131* 132*  --  141  K 5.0 5.1  --  4.3  CL 96* 99  --  110  CO2 23 23  --  22  GLUCOSE 107* 98  --  85  BUN 58* 56*  --  29*  CREATININE 4.60* 4.00* 2.43* 0.86  CALCIUM 8.4* 7.7*  --  7.9*  MG  --   --   --  1.7  PHOS  --   --   --  3.0   Estimated Creatinine Clearance: 67.4 mL/min (by C-G formula based on SCr of 0.86 mg/dL). Liver & Pancreas: Recent Labs  Lab 01/18/22 0211 01/19/22 0415  AST 21  --   ALT 13  --   ALKPHOS 125  --   BILITOT 0.6  --   PROT 4.6*  --   ALBUMIN 2.1* 2.2*   No results for input(s): "LIPASE", "AMYLASE" in the last 168 hours. No results for input(s): "AMMONIA" in the last 168 hours. Diabetic: No results for input(s): "HGBA1C" in the last 72 hours. No results for input(s): "GLUCAP" in the last 168 hours. Cardiac Enzymes: Recent Labs  Lab 01/18/22 0930  CKTOTAL 365   No results for input(s): "PROBNP" in the last 8760 hours. Coagulation Profile: Recent Labs  Lab 01/18/22 0211  INR 1.3*   Thyroid Function Tests: No results for input(s): "TSH", "T4TOTAL", "FREET4", "T3FREE", "THYROIDAB" in the last 72 hours. Lipid Profile: No results for input(s): "CHOL", "HDL", "LDLCALC", "TRIG",  "CHOLHDL", "LDLDIRECT" in the last 72 hours. Anemia Panel: Recent Labs    01/19/22 0415  VITAMINB12 403  FOLATE 7.3  FERRITIN 147  TIBC 170*  IRON 11*  RETICCTPCT 1.5   Urine analysis:    Component  Value Date/Time   COLORURINE AMBER (A) 01/18/2022 0249   APPEARANCEUR HAZY (A) 01/18/2022 0249   LABSPEC 1.020 01/18/2022 0249   PHURINE 5.0 01/18/2022 0249   GLUCOSEU NEGATIVE 01/18/2022 0249   GLUCOSEU NEGATIVE 09/29/2019 1125   HGBUR NEGATIVE 01/18/2022 0249   BILIRUBINUR NEGATIVE 01/18/2022 0249   BILIRUBINUR negative 08/01/2016 1419   KETONESUR NEGATIVE 01/18/2022 0249   PROTEINUR NEGATIVE 01/18/2022 0249   UROBILINOGEN 0.2 09/29/2019 1125   NITRITE NEGATIVE 01/18/2022 0249   LEUKOCYTESUR NEGATIVE 01/18/2022 0249   Sepsis Labs: Invalid input(s): "PROCALCITONIN", "LACTICIDVEN"  Microbiology: Recent Results (from the past 240 hour(s))  Resp panel by RT-PCR (RSV, Flu A&B, Covid) Anterior Nasal Swab     Status: None   Collection Time: 01/18/22 12:06 AM   Specimen: Anterior Nasal Swab  Result Value Ref Range Status   SARS Coronavirus 2 by RT PCR NEGATIVE NEGATIVE Final    Comment: (NOTE) SARS-CoV-2 target nucleic acids are NOT DETECTED.  The SARS-CoV-2 RNA is generally detectable in upper respiratory specimens during the acute phase of infection. The lowest concentration of SARS-CoV-2 viral copies this assay can detect is 138 copies/mL. A negative result does not preclude SARS-Cov-2 infection and should not be used as the sole basis for treatment or other patient management decisions. A negative result may occur with  improper specimen collection/handling, submission of specimen other than nasopharyngeal swab, presence of viral mutation(s) within the areas targeted by this assay, and inadequate number of viral copies(<138 copies/mL). A negative result must be combined with clinical observations, patient history, and epidemiological information. The expected result is  Negative.  Fact Sheet for Patients:  EntrepreneurPulse.com.au  Fact Sheet for Healthcare Providers:  IncredibleEmployment.be  This test is no t yet approved or cleared by the [place] FDA and  has been authorized for detection and/or diagnosis of SARS-CoV-2 by FDA under an Emergency Use Authorization (EUA). This EUA will remain  in effect (meaning this test can be used) for the duration of the COVID-19 declaration under Section 564(b)(1) of the Act, 21 U.S.C.section 360bbb-3(b)(1), unless the authorization is terminated  or revoked sooner.       Influenza A by PCR NEGATIVE NEGATIVE Final   Influenza B by PCR NEGATIVE NEGATIVE Final    Comment: (NOTE) The Xpert Xpress SARS-CoV-2/FLU/RSV plus assay is intended as an aid in the diagnosis of influenza from Nasopharyngeal swab specimens and should not be used as a sole basis for treatment. Nasal washings and aspirates are unacceptable for Xpert Xpress SARS-CoV-2/FLU/RSV testing.  Fact Sheet for Patients: EntrepreneurPulse.com.au  Fact Sheet for Healthcare Providers: IncredibleEmployment.be  This test is not yet approved or cleared by the [place] FDA and has been authorized for detection and/or diagnosis of SARS-CoV-2 by FDA under an Emergency Use Authorization (EUA). This EUA will remain in effect (meaning this test can be used) for the duration of the COVID-19 declaration under Section 564(b)(1) of the Act, 21 U.S.C. section 360bbb-3(b)(1), unless the authorization is terminated or revoked.     Resp Syncytial Virus by PCR NEGATIVE NEGATIVE Final    Comment: (NOTE) Fact Sheet for Patients: EntrepreneurPulse.com.au  Fact Sheet for Healthcare Providers: IncredibleEmployment.be  This test is not yet approved or cleared by the [place] FDA and has been authorized for detection and/or diagnosis of  SARS-CoV-2 by FDA under an Emergency Use Authorization (EUA). This EUA will remain in effect (meaning this test can be used) for the duration of the COVID-19 declaration under Section 564(b)(1) of the Act, 21  U.S.C. section 360bbb-3(b)(1), unless the authorization is terminated or revoked.  Performed at [facility], [place] [address], [place], [place] [address]   Blood Culture (routine x 2)     Status: None (Preliminary result)   Collection Time: 01/18/22 12:06 AM   Specimen: BLOOD  Result Value Ref Range Status   Specimen Description   Final    BLOOD BLOOD LEFT WRIST Performed at [facility] [address], [place], [place] [address]    Special Requests   Final    BOTTLES DRAWN AEROBIC AND ANAEROBIC Blood Culture adequate volume Performed at [facility] [address] [place], [place] [address]    Culture   Final    NO GROWTH 1 DAY Performed at [facility], [place] [address], [place], [place] [address]    Report Status PENDING  Incomplete  Blood Culture (routine x 2)     Status: None (Preliminary result)   Collection Time: 01/18/22 12:11 AM   Specimen: BLOOD  Result Value Ref Range Status   Specimen Description   Final    BLOOD RIGHT ANTECUBITAL Performed at [facility] [address] [place], [place] [address]    Special Requests   Final    BOTTLES DRAWN AEROBIC AND ANAEROBIC Blood Culture adequate volume Performed at [facility] [address] [place], [place] [address]    Culture   Final    NO GROWTH 1 DAY Performed at [facility], [place] [address] [place], [place] [address]    Report Status PENDING  Incomplete  Urine Culture     Status: None   Collection Time: 01/18/22  2:49 AM   Specimen: In/Out Cath Urine  Result Value Ref Range Status   Specimen Description   Final    IN/OUT CATH URINE Performed at [facility] [address], [place], [place] [address]    Special Requests   Final    Normal Performed at [facility], [place] [address], [place], [place] [address]    Culture   Final    NO GROWTH Performed at [facility], [place] [address], [place], [place] [address]    Report Status 01/19/2022 FINAL  Final    Radiology Studies: No results found.    Mikka Kissner T. Towns  If 7PM-7AM, please contact night-coverage [email or web address] 01/19/2022, 3:53 PM

## 2022-01-20 DIAGNOSIS — A419 Sepsis, unspecified organism: Secondary | ICD-10-CM | POA: Diagnosis not present

## 2022-01-20 DIAGNOSIS — R6520 Severe sepsis without septic shock: Secondary | ICD-10-CM | POA: Diagnosis not present

## 2022-01-20 DIAGNOSIS — N179 Acute kidney failure, unspecified: Secondary | ICD-10-CM | POA: Diagnosis not present

## 2022-01-20 LAB — MRSA NEXT GEN BY PCR, NASAL: MRSA by PCR Next Gen: NOT DETECTED

## 2022-01-20 MED ORDER — ONDANSETRON HCL 4 MG/2ML IJ SOLN
4.0000 mg | Freq: Four times a day (QID) | INTRAMUSCULAR | Status: AC | PRN
  Administered 2022-01-20 (×2): 4 mg via INTRAVENOUS
  Filled 2022-01-20 (×2): qty 2

## 2022-01-20 MED ORDER — OXYCODONE HCL 10 MG PO TABS: 10.0000 mg | ORAL_TABLET | Freq: Four times a day (QID) | ORAL | 0 refills | Status: DC | PRN

## 2022-01-20 MED ORDER — AMOXICILLIN-POT CLAVULANATE 875-125 MG PO TABS: 1.0000 | ORAL_TABLET | Freq: Two times a day (BID) | ORAL | 0 refills | Status: AC

## 2022-01-20 MED ORDER — SODIUM CHLORIDE 0.9 % IV SOLN
250.0000 mg | Freq: Every day | INTRAVENOUS | Status: DC
  Administered 2022-01-20: 250 mg via INTRAVENOUS
  Filled 2022-01-20: qty 20

## 2022-01-20 MED ORDER — FERROUS SULFATE 325 (65 FE) MG PO TBEC: 325.0000 mg | DELAYED_RELEASE_TABLET | Freq: Every day | ORAL | 0 refills | Status: DC

## 2022-01-20 MED ORDER — TRAMADOL HCL 50 MG PO TABS: 100.0000 mg | ORAL_TABLET | Freq: Three times a day (TID) | ORAL | 5 refills | Status: DC | PRN

## 2022-01-20 NOTE — Progress Notes (Signed)
Notified on call provider that patient was nauseous and vomiting since patient did not have anything PRN for nausea/vomiting. On call provider put in a new order for PRN IV Zofran 4 mg every 6 hours for nausea, vomiting for two doses. Patient was given PRN IV Zofran at 0256, which patient only vomited once, which looked more like spit.

## 2022-01-20 NOTE — Hospital Course (Signed)
81 year old M with PMH of CAD/stent to LAD in 2022, chronic back pain on chronic opiate, PMR on low-dose prednisone, COPD, HTN, BPH, anxiety, depression, portal vein thrombosis, tonsillar cancer status postradiation, esophageal stricture, and severe aortic stenosis s/p TAVR and AAA repair presenting with "feeling sick", poor p.o. intake, nausea, emesis, dizziness, weakness and fall, and admitted for severe sepsis due to aspiration pneumonia and AKI.  CXR showed right basilar infiltrate. Cr 4.6 (baseline 0.7).  Started on IV fluid and IV Unasyn.

## 2022-01-20 NOTE — Progress Notes (Signed)
PT Cancellation Note  Patient Details Name: Brandon [name] MRN: [id number] DOB: January 12, 1941   Cancelled Treatment:     PT order received but eval deferred this pm. Pt now with dc order.  Pt seen by OT this am and screened as at or near baseline.  Will follow in am if pt has not returned home.   Catrice Zuleta 01/20/2022, 4:37 PM

## 2022-01-20 NOTE — Plan of Care (Signed)
  Problem: Education: Goal: Knowledge of General Education information will improve Description: Including pain rating scale, medication(s)/side effects and non-pharmacologic comfort measures Outcome: Progressing   Problem: Coping: Goal: Level of anxiety will decrease Outcome: Progressing   Problem: Elimination: Goal: Will not experience complications related to urinary retention Outcome: Progressing   Problem: Pain Managment: Goal: General experience of comfort will improve Outcome: Not Progressing   Problem: Safety: Goal: Ability to remain free from injury will improve Outcome: Progressing   Problem: Skin Integrity: Goal: Risk for impaired skin integrity will decrease Outcome: Progressing

## 2022-01-20 NOTE — Plan of Care (Signed)

## 2022-01-20 NOTE — Discharge Summary (Signed)
Physician Discharge Summary   Patient: Brandon [name] MRN: [id number] DOB: 09/25/1940  Admit date:     01/17/2022  Discharge date: 01/20/22  Discharge Physician: Marylu Lund   PCP: Haydee Salter, MD   Recommendations at discharge:    Follow up with your PCP in 1-2 weeks  Discharge Diagnoses: Principal Problem:   Severe sepsis ([place]) Active Problems:   Hyperlipidemia   History of benign prostatic hyperplasia   Essential hypertension   Cancer of tonsillar fossa (HCC)   Sciatica associated with disorder of lumbar spine   Chronic pain syndrome   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HCC)   Esophageal stricture   History of transcatheter aortic valve replacement (TAVR)   Chronic obstructive pulmonary disease (HCC)   History of placement of stent in LAD coronary artery   Benign prostatic hyperplasia   Chronic diastolic heart failure (HCC)   Lumbar postlaminectomy syndrome   Hypotension   Oropharyngeal dysphagia   Aspiration pneumonia ([place])   At risk for polypharmacy  Resolved Problems:   * No resolved hospital problems. *  Hospital Course: 81 year old M with PMH of CAD/stent to LAD in 2022, chronic back pain on chronic opiate, PMR on low-dose prednisone, COPD, HTN, BPH, anxiety, depression, portal vein thrombosis, tonsillar cancer status postradiation, esophageal stricture, and severe aortic stenosis s/p TAVR and AAA repair presenting with "feeling sick", poor p.o. intake, nausea, emesis, dizziness, weakness and fall, and admitted for severe sepsis due to aspiration pneumonia and AKI.  CXR showed right basilar infiltrate. Cr 4.6 (baseline 0.7).  Started on IV fluid and IV Unasyn.     Assessment and Plan: Severe sepsis due to aspiration pneumonia: POA.  Has leukocytosis, tachycardia, tachypnea, hypotension and AKI on presentation.  CXR suggesting RLL infiltrate.  He had an episode of emesis.  Also have history of tonsillar cancer and esophageal stricture although he denies dysphagia.  He  is on very high-dose narcotics and gabapentin. Blood and urine cultures NGTD.  Sepsis physiology resolved. -Weaned off oxygen to room air -was continued on IV Unasyn, change to augmentin on d/c -SLP-dysphagia 3 diet -Dr. Cyndia Skeeters had extensive discussion about risk of polypharmacy with patient and patient's wife at bedside   Acute kidney injury/azotemia: Multifactorial including dehydration from poor p.o. intake, concurrent use of diuretics, lisinopril and sepsis.  Had a Foley placed in ED but no report of urinary retention.  AKI resolved.   Chronic diastolic heart failure: Appears euvolemic.   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  At serious risk for polypharmacy.  Patient's wife is very concerned about this.  Pain fairly controlled on the regimen below. -Held MS Contin given renal failure.  OxyContin 10 mg twice daily instead. With normalization in renal function, resume home MS contin -Resumed Oxy IR 10 mg every 6 hours as needed for severe pain.  Takes every 4 hours as needed at home. -Tramadol 50 mg every 8 hours as needed for moderate pain.  On 100 mg at home. -Resume home gabapentin at reduced dose. -P.o. Tylenol as needed for mild pain -Dr. Cyndia Skeeters discussed risk of polypharmacy and encouraged patient and his wife to discuss with his pain clinician.  Meanwhile, he encouraged patient to space out on Oxy IR.    Hypotension/history of hypertension: Multifactorial including sepsis, dehydration, possible adrenal insufficiency and iatrogenic from  antihypertensive meds.  Resolved. -Restarted Coreg at lower dose -Continue holding amlodipine, Lasix and lisinopril -BP increased and became suboptimally controlled. Resume home coreg, norvasc. Lisinopril cont to be held at time of d/c, would resume as BP allows   At risk for  polypharmacy: See chronic pain   BPH with urinary retention: Foley inserted in ED. no mention of urine retention. -Continue home Flomax.   Hyponatremia:  Likely from dehydration and AKI.  Resolved.   COPD: Stable. -Nebs as needed   Anemia of chronic disease/thrombocytopenia: Stable.     PolyMyalgia rheumatica: On low-dose prednisone -Continue home prednisone 5 mg daily   Body mass index is 26.21 kg/m.       Consultants:  Procedures performed:   Disposition: Home Diet recommendation:  Dysphagia type 3 thin Liquid DISCHARGE MEDICATION: Allergies as of 01/20/2022       Reactions   Tape Other (See Comments)   Tears skin. Prefers paper tape, Please   Celebrex [celecoxib] Hives, Itching        Medication List     STOP taking these medications    lisinopril 20 MG tablet Commonly known as: ZESTRIL   potassium chloride 10 MEQ tablet Commonly known as: KLOR-CON       TAKE these medications    acetaminophen 500 MG tablet Commonly known as: TYLENOL Take 1,000 mg by mouth every 8 (eight) hours as needed for mild pain or headache.   albuterol 108 (90 Base) MCG/ACT inhaler Commonly known as: VENTOLIN HFA INHALE 2 PUFFS INTO THE LUNGS EVERY 6 HOURS AS NEEDED FOR WHEEZING OR SHORTNESS OF BREATH   allopurinol 100 MG tablet Commonly known as: ZYLOPRIM TAKE 2 TABLETS(200 MG) BY MOUTH DAILY What changed: See the new instructions.   amLODipine 10 MG tablet Commonly known as: NORVASC TAKE 1 TABLET(10 MG) BY MOUTH DAILY What changed: See the new instructions.   amoxicillin-clavulanate 875-125 MG tablet Commonly known as: AUGMENTIN Take 1 tablet by mouth 2 (two) times daily for 4 days.   aspirin EC 81 MG tablet Take 81 mg by mouth at bedtime. Swallow whole.   atorvastatin 80 MG tablet Commonly known as: LIPITOR Take 1 tablet (80 mg total) by mouth daily. What changed: when to take this   carvedilol 12.5 MG tablet Commonly known as: Coreg Take 1 tablet  (12.5 mg total) by mouth 2 (two) times daily.   ezetimibe 10 MG tablet Commonly known as: Zetia Take 1 tablet (10 mg total) by mouth daily.   ferrous sulfate 325 (65 FE) MG EC tablet Take 1 tablet (325 mg total) by mouth daily with breakfast.   FLUoxetine 40 MG capsule Commonly known as: PROzac Take 1 capsule (40 mg total) by mouth daily.   fluticasone 50 MCG/ACT nasal spray Commonly known as: FLONASE SHAKE LIQUID AND USE 2 SPRAYS IN EACH NOSTRIL DAILY AS NEEDED FOR RHINITIS OR ALLERGIES What changed: See the new instructions.   furosemide 20 MG tablet Commonly known as: LASIX Take 1 tablet (20 mg total) by mouth daily.   gabapentin 800 MG tablet Commonly known as: Neurontin Take 1 tablet (800 mg total) by mouth 3 (three) times daily. Can increase to 4x/day if needed after 3 days- for nerve pain What changed: additional instructions   guaiFENesin 600 MG 12 hr tablet Commonly known as: MUCINEX Take 600 mg by mouth 2 (two) times daily as needed for cough or to loosen phlegm.   hydrOXYzine 10 MG tablet Commonly known as: ATARAX Take 10-20 mg by mouth  at bedtime as needed for itching (or sleep).   ipratropium 0.02 % nebulizer solution Commonly known as: ATROVENT Use 1 vial (0.5 mg total) by nebulization every 6 (six) hours as needed for wheezing or shortness of breath.   ipratropium 0.03 % nasal spray Commonly known as: ATROVENT Place 2 sprays into both nostrils every 12 (twelve) hours.   levalbuterol 0.63 MG/3ML nebulizer solution Commonly known as: XOPENEX Use 1 vial (0.63 mg total) by nebulization every 6 (six) hours as needed for wheezing or shortness of breath.   morphine 15 MG 12 hr tablet Commonly known as: MS CONTIN Take 1 tablet (15 mg total) by mouth every 12 (twelve) hours. For chronic pain- severe DJD   Oxycodone HCl 10 MG Tabs Take 1 tablet (10 mg total) by mouth every 6 (six) hours as needed (severe pain). Can be refilled as of 01/10/22- for chronic  pain What changed:  when to take this reasons to take this   pantoprazole 40 MG tablet Commonly known as: PROTONIX Take 1 tablet (40 mg total) by mouth daily. What changed:  when to take this reasons to take this   predniSONE 5 MG tablet Commonly known as: DELTASONE TAKE 1 TABLET(5 MG) BY MOUTH DAILY WITH BREAKFAST What changed: See the new instructions.   Spiriva Respimat 2.5 MCG/ACT Aers Generic drug: Tiotropium Bromide Monohydrate Inhale 2 puffs into the lungs daily.   tamsulosin 0.4 MG Caps capsule Commonly known as: FLOMAX Take 0.4 mg by mouth See admin instructions. Take 0.4 mg by mouth one to two times a day   traMADol 50 MG tablet Commonly known as: Ultram Take 2 tablets (100 mg total) by mouth every 8 (eight) hours as needed for moderate pain. What changed:  when to take this reasons to take this   triamcinolone cream 0.1 % Commonly known as: KENALOG Apply 1 Application topically 2 (two) times daily as needed (to itchy sites).   trolamine salicylate 10 % cream Commonly known as: ASPERCREME Apply 1 Application topically 2 (two) times daily as needed (arthritis pain).        Follow-up Information     Haydee Salter, MD Follow up in 1 week(s).   Specialty: Family Medicine Why: Hospital follow up Contact information: [place] [address] [phone]                Discharge Exam: Danley Danker Weights   01/18/22 0915 01/18/22 2147 01/20/22 0500  Weight: 74.8 kg 78.2 kg 79.4 kg   General exam: Awake, laying in bed, in nad Respiratory system: Normal respiratory effort, no wheezing Cardiovascular system: regular rate, s1, s2 Gastrointestinal system: Soft, nondistended, positive BS Central nervous system: CN2-12 grossly intact, strength intact Extremities: Perfused, no clubbing Skin: Normal skin turgor, no notable skin lesions seen Psychiatry: Mood normal // no visual hallucinations   Condition at discharge: fair  The  results of significant diagnostics from this hospitalization (including imaging, microbiology, ancillary and laboratory) are listed below for reference.   Imaging Studies: DG Swallowing Func-Speech Pathology  Result Date: 01/18/2022 Table formatting from the original result was not included. 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   HLD (hyperlipidemia)   HTN (hypertension)   Liver abscess 07/10/2016  Osteoarthritis   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  s/p TAVR with a 29 mm Edwards Sapien 3 via the subclavian approach by Dr. Burt Knack and Dr Cyndia Bent   Severe aortic stenosis   TIA (transient ischemic attack) 1990s  hx  tonsillar ca dx'd 11/2017  Tubular adenoma of colon 2009 Past Surgical History: Past Surgical History: Procedure Laterality Date  ABDOMINAL AORTIC ENDOVASCULAR STENT GRAFT N/A 03/03/2021  Procedure: ABDOMINAL AORTIC ENDOVASCULAR STENT GRAFT;  Surgeon: Waynetta Sandy, MD;  Location: [name];  Service: Vascular;  Laterality: N/A;  ABDOMINAL AORTOGRAM W/LOWER EXTREMITY N/A 04/10/2021  Procedure: ABDOMINAL AORTOGRAM W/LOWER EXTREMITY;  Surgeon: Waynetta Sandy, MD;  Location: [facility];  Service:  Cardiovascular;  Laterality: N/A;  BACK SURGERY    CARDIAC CATHETERIZATION    CATARACT EXTRACTION W/ INTRAOCULAR LENS  IMPLANT, BILATERAL Bilateral 01/2012 - 02/2012  CORONARY ATHERECTOMY N/A 04/27/2020  Procedure: CORONARY ATHERECTOMY;  Surgeon: Sherren Mocha, MD;  Location: [facility];  Service: Cardiovascular;  Laterality: N/A;  CORONARY STENT INTERVENTION N/A 04/27/2020  Procedure: CORONARY STENT INTERVENTION;  Surgeon: Sherren Mocha, MD;  Location: [facility];  Service: Cardiovascular;  Laterality: N/A;  DIRECT LARYNGOSCOPY Left 10/09/2017  Procedure: DIRECT LARYNGOSCOPY WITH BOPSY;  Surgeon: Jodi Marble, MD;  Location: [facility];  Service: ENT;  Laterality: Left;  ESOPHAGOGASTRODUODENOSCOPY (EGD) WITH ESOPHAGEAL DILATION    "couple times" (07/19/2016)  ESOPHAGOSCOPY Left 10/09/2017  Procedure: ESOPHAGOSCOPY;  Surgeon: Jodi Marble, MD;  Location: [facility];  Service: ENT;  Laterality: Left;  INTRAVASCULAR IMAGING/OCT N/A 04/27/2020  Procedure: INTRAVASCULAR IMAGING/OCT;  Surgeon: Sherren Mocha, MD;  Location: [facility];  Service: Cardiovascular;  Laterality: N/A;  INTRAVASCULAR PRESSURE WIRE/FFR STUDY N/A 04/27/2020  Procedure: INTRAVASCULAR PRESSURE WIRE/FFR STUDY;  Surgeon: Sherren Mocha, MD;  Location: [facility];  Service: Cardiovascular;  Laterality: N/A;  IR GASTROSTOMY TUBE MOD SED  01/08/2018  IR THORACENTESIS ASP PLEURAL SPACE W/IMG GUIDE  07/19/2016  LAPAROSCOPIC CHOLECYSTECTOMY  1984  LUMBAR [place] SURGERY  05/1996  L4-5; Dr. Claudean Kinds LAMINECTOMY/DECOMPRESSION MICRODISCECTOMY  10/2002  L3-4. Dr. Sherwood Gambler  MULTIPLE TOOTH EXTRACTIONS  1980s  PARTIAL GLOSSECTOMY  12/02/2017  Dr. Nicolette Bang- [facility]  pharyngoplasty for closure of tingue base defect  12/02/2017  Dr. Nicolette Bang- [place]  07/15/2016  POSTERIOR LUMBAR FUSION  09/1996  Ray cage, L4-5 Dr. Rita Ohara  PROSTATE BIOPSY  ~ 2017  radical tonsillectomy Left  12/02/2017  Dr. Nicolette Bang at [facility] N/A 03/31/2020  Procedure: RIGHT HEART CATH AND CORONARY ANGIOGRAPHY;  Surgeon: Larey Dresser, MD;  Location: [facility];  Service: Cardiovascular;  Laterality: N/A;  RIGID BRONCHOSCOPY Left 10/09/2017  Procedure: RIGID BRONCHOSCOPY;  Surgeon: Jodi Marble, MD;  Location: [facility];  Service: ENT;  Laterality: Left;  TEE WITHOUT CARDIOVERSION N/A 06/07/2020  Procedure: TRANSESOPHAGEAL ECHOCARDIOGRAM (TEE);  Surgeon: Sherren Mocha, MD;  Location: [facility];  Service:  Open Heart Surgery;  Laterality: N/A;  TONSILLECTOMY    TOTAL HIP ARTHROPLASTY Left 12/04/2020  Procedure: TOTAL HIP ARTHROPLASTY ANTERIOR APPROACH;  Surgeon: Rod Can, MD;  Location: [facility];  Service: Orthopedics;  Laterality: Left;  TOTAL HIP ARTHROPLASTY Left 11/2020  TRACHEOSTOMY  12/02/2017  Dr. Nicolette Bang- [facility]  ULTRASOUND GUIDANCE FOR VASCULAR ACCESS Right 06/07/2020  Procedure: ULTRASOUND GUIDANCE FOR VASCULAR ACCESS;  Surgeon: Sherren Mocha, MD;  Location: [facility];  Service: Open Heart Surgery;  Laterality: Right;  ULTRASOUND GUIDANCE FOR VASCULAR ACCESS Bilateral 03/03/2021  Procedure: ULTRASOUND GUIDANCE FOR VASCULAR ACCESS, BILATERAL FEMORAL ARTERIES;  Surgeon: Waynetta Sandy, MD;  Location: [facility];  Service: Vascular;  Laterality: Bilateral;  VASCULAR SURGERY   HPI: Per MD note "ALBARAA SWINGLE is a 81 y.o. male with medical history significant of with a past medical history of prior stent to LAD, HLD, ? brain stem CVA vs TIA, COPD, hypertension, GERD and esophageal stricture, osteoarthritis and chronic pain of lumbar spine, chronic diastolic heart failure, BPH, anemia, history of treatment of tonsillar cancer, history of TAVR or who presented to the ER following at home because of increased dizziness and difficulty moving.  Patient reports that this usually happens when he is dehydrated.   She was complaining of significant pain  in the right rib cage area upon presentation."  He has h/o tonsillar cancer s/p left radical neck dissection and XRT.   Scar tissue removal, tonsillar/lingual ca (s/p transhyoid/transcervical resection of the tonsil and tongue base with left neck dissection on 12/02/17. 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, requiring two returns to the OR;  scar tissue release and tongue repair with [facility] esophageal dilation). Pt underwent MBS in 08/2021 and was advised to consume dys3/thin diet - following solid with liquids and throat clearing.  Pt admits to recent "choking" episode prior to this admission on liquids.  He cuts his pill in half - as he recalls choking on pill in 08/2021.  Subjective: pt awake in chair  Recommendations for follow up therapy are one component of a multi-disciplinary discharge planning process, led by the attending physician.  Recommendations may be updated based on patient status, additional functional criteria and insurance authorization. Assessment / Plan / Recommendation   01/18/2022   4:02 PM Clinical Impressions Clinical Impression Pt continues with his baseline dysphagia from iatrogenic effects of undergoing pharyngeal surgeries and XRT for tonsillar cancer in 2019-2020. 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  Multiple swallows and liquid swallows effective to help protect his airway of retention.Pt did clear his throat and occasionally cough during MBS but he has not aspirated.   Mild laryngeal penetration of thin and nectar liquids noted - no aspiration occurred however.  Chin tuck posture with liquids helps to decrease amount of penetration - but did not prevent retention.   Cued expectoration effective to clear all of vallecular retention that pt did not transit into esophagus.   No nasal regurgitation observed today - but pt admits it occurs at home - if he leans over after intake.    Recommend Dys 3 diet and thin liquids with strict compliance to aspiration precautions - especially given suspected overt coughing/aspiration episode with liquids PTA.  Pt advises he does do his swallow exercises provided by SLP - but will re-engage these for maximal swallow function/airway protection.  Using teach back and review of flouro loops, pt educated to findings/recommendations. In addition, spoke to son and his wife  on pt's cell phone reviewing the test results and importance of pt to cough/expectorate to protect his airway. SLP Visit Diagnosis Dysphagia, pharyngoesophageal phase (R13.14) Impact on safety and function Moderate aspiration risk     01/18/2022   4:02 PM Treatment Recommendations Treatment Recommendations Therapy as outlined in treatment plan below     01/18/2022   4:11 PM Prognosis Prognosis for Safe Diet Advancement Fair Barriers to Reach Goals Time post onset   01/18/2022   4:02 PM Diet Recommendations SLP Diet Recommendations Dysphagia 3 (Mech soft) solids;Thin liquid Medication Administration Other (Comment) Compensations Small sips/bites;Slow rate;Multiple dry swallows after each bite/sip;Follow solids with liquid;Other (Comment)     01/18/2022   4:02 PM Other Recommendations Brandon Care Recommendations Brandon care QID Follow Up Recommendations Home health SLP Functional Status Assessment Patient has had a recent decline in their functional status and demonstrates the ability to make significant improvements in function in a reasonable and predictable amount of time.   01/18/2022   4:02 PM Frequency and Duration  Speech Therapy Frequency (ACUTE ONLY) min 1 x/week Treatment Duration 1 week     01/18/2022   3:57 PM Brandon Phase Brandon Phase WFL Brandon - Nectar Cup [facility] Brandon - Thin Cup [facility] Brandon - Thin Straw WFL Brandon - Puree WFL Brandon - Mech Soft [facility]    01/18/2022   3:57 PM Pharyngeal Phase Pharyngeal Phase Impaired Pharyngeal- Nectar Cup Reduced pharyngeal peristalsis;Reduced epiglottic  inversion;Reduced laryngeal elevation;Reduced airway/laryngeal closure;Reduced tongue base retraction;Pharyngeal residue - valleculae;Lateral channel residue;Penetration/Aspiration during swallow Pharyngeal Material enters airway, remains ABOVE vocal cords then ejected out Pharyngeal- Thin Cup Reduced pharyngeal peristalsis;Reduced epiglottic inversion;Reduced anterior laryngeal mobility;Reduced laryngeal elevation;Reduced airway/laryngeal closure;Reduced tongue base retraction;Pharyngeal residue - valleculae;Lateral channel residue;Inter-arytenoid space residue;Pharyngeal residue - pyriform;Penetration/Aspiration during swallow Pharyngeal Material enters airway, remains ABOVE vocal cords and not ejected out;Material enters airway, remains ABOVE vocal cords then ejected out Pharyngeal- Thin Straw Pharyngeal residue - pyriform;Pharyngeal residue - valleculae;Reduced epiglottic inversion;Reduced pharyngeal peristalsis;Reduced anterior laryngeal mobility;Reduced airway/laryngeal closure;Reduced laryngeal elevation;Reduced tongue base retraction;Penetration/Aspiration during swallow;Lateral channel residue Pharyngeal Material enters airway, remains ABOVE vocal cords then ejected out Pharyngeal- Puree Reduced pharyngeal peristalsis;Reduced epiglottic inversion;Reduced anterior laryngeal mobility;Reduced laryngeal elevation;Reduced airway/laryngeal closure;Pharyngeal residue - valleculae Pharyngeal Material does not enter airway Pharyngeal- Mechanical Soft Reduced pharyngeal peristalsis;Reduced epiglottic inversion;Reduced anterior laryngeal mobility;Reduced laryngeal elevation;Reduced airway/laryngeal closure;Pharyngeal residue - valleculae Pharyngeal Material does not enter airway Pharyngeal Comment Various postures including chin tuck, head turn left with and without chin tuck, head of chair reclined were not helpful; following solids with liquids helpful to decrease pharyngeal stasis as well as dry swallows; cued  "hock" and expectoration effective to clear vallecular retention that pt did not swallow and clear. Tight thin tuck posture with liquids did decrease amount of laryngeal penetration but was marginally difficult for pt to conduct.    01/18/2022   4:01 PM Cervical Esophageal Phase  Cervical Esophageal Phase Impaired Macario Golds 01/18/2022, 4:12 PM      Kathleen Lime, MS [facility] SLP Acute Rehab Services Office [phone] Pager [phone]                CT Head Wo Contrast  Result Date: 01/18/2022 CLINICAL DATA:  Recent fall EXAM: CT HEAD WITHOUT CONTRAST CT CERVICAL SPINE WITHOUT CONTRAST TECHNIQUE: Multidetector CT imaging of the head and cervical spine was performed following the standard protocol without intravenous contrast. Multiplanar CT image reconstructions of the cervical spine were also generated. RADIATION DOSE REDUCTION: This exam was performed according to the  departmental dose-optimization program which includes automated exposure control, adjustment of the mA and/or kV according to patient size and/or use of iterative reconstruction technique. COMPARISON:  None Available. FINDINGS: CT HEAD FINDINGS Brain: No evidence of acute infarction, hemorrhage, hydrocephalus, extra-axial collection or mass lesion/mass effect. Chronic atrophic changes are noted. Vascular: No hyperdense vessel or unexpected calcification. Skull: Normal. Negative for fracture or focal lesion. Sinuses/Orbits: Orbits and their contents are within normal limits. Paranasal sinuses show air-fluid level in the right maxillary antrum consistent with sinusitis. Other: None CT CERVICAL SPINE FINDINGS Alignment: Within normal limits. Skull base and vertebrae: Cervical segments are well visualized. Vertebral body is well maintained. Osteophytic changes are noted disc space narrowing particularly at C5-6. Facet hypertrophic changes are noted bilaterally. No acute fracture or acute facet abnormality is noted. The odontoid is within normal  limits. Soft tissues and spinal canal: Surrounding soft tissue structures are within normal limits. Upper chest: Visualized lung apices are within normal limits. Other: None IMPRESSION: CT of the head: Chronic atrophic changes without acute intracranial abnormality. Air-fluid level within the right maxillary antrum suspicious for acute sinusitis. CT of the cervical spine: Multilevel degenerative change without acute abnormality. Electronically Signed   By: Inez Catalina M.D.   On: 01/18/2022 00:46   CT Cervical Spine Wo Contrast  Result Date: 01/18/2022 CLINICAL DATA:  Recent fall EXAM: CT HEAD WITHOUT CONTRAST CT CERVICAL SPINE WITHOUT CONTRAST TECHNIQUE: Multidetector CT imaging of the head and cervical spine was performed following the standard protocol without intravenous contrast. Multiplanar CT image reconstructions of the cervical spine were also generated. RADIATION DOSE REDUCTION: This exam was performed according to the departmental dose-optimization program which includes automated exposure control, adjustment of the mA and/or kV according to patient size and/or use of iterative reconstruction technique. COMPARISON:  None Available. FINDINGS: CT HEAD FINDINGS Brain: No evidence of acute infarction, hemorrhage, hydrocephalus, extra-axial collection or mass lesion/mass effect. Chronic atrophic changes are noted. Vascular: No hyperdense vessel or unexpected calcification. Skull: Normal. Negative for fracture or focal lesion. Sinuses/Orbits: Orbits and their contents are within normal limits. Paranasal sinuses show air-fluid level in the right maxillary antrum consistent with sinusitis. Other: None CT CERVICAL SPINE FINDINGS Alignment: Within normal limits. Skull base and vertebrae: Cervical segments are well visualized. Vertebral body is well maintained. Osteophytic changes are noted disc space narrowing particularly at C5-6. Facet hypertrophic changes are noted bilaterally. No acute fracture or acute  facet abnormality is noted. The odontoid is within normal limits. Soft tissues and spinal canal: Surrounding soft tissue structures are within normal limits. Upper chest: Visualized lung apices are within normal limits. Other: None IMPRESSION: CT of the head: Chronic atrophic changes without acute intracranial abnormality. Air-fluid level within the right maxillary antrum suspicious for acute sinusitis. CT of the cervical spine: Multilevel degenerative change without acute abnormality. Electronically Signed   By: Inez Catalina M.D.   On: 01/18/2022 00:46   DG Chest Portable 1 View  Result Date: 01/18/2022 CLINICAL DATA:  Recent fall with chest pain, initial encounter EXAM: PORTABLE CHEST 1 VIEW COMPARISON:  08/21/2021 FINDINGS: Cardiac shadow is within normal limits. Prior TAVR is again noted. Aortic calcifications are seen. New patchy infiltrate is noted in the right base consistent with pneumonia. No bony abnormality is seen. IMPRESSION: Right basilar pneumonia. Electronically Signed   By: Inez Catalina M.D.   On: 01/18/2022 00:04    Microbiology: Results for orders placed or performed during the hospital encounter of 01/17/22  Resp panel by RT-PCR (RSV,  Flu A&B, Covid) Anterior Nasal Swab     Status: None   Collection Time: 01/18/22 12:06 AM   Specimen: Anterior Nasal Swab  Result Value Ref Range Status   SARS Coronavirus 2 by RT PCR NEGATIVE NEGATIVE Final    Comment: (NOTE) SARS-CoV-2 target nucleic acids are NOT DETECTED.  The SARS-CoV-2 RNA is generally detectable in upper respiratory specimens during the acute phase of infection. The lowest concentration of SARS-CoV-2 viral copies this assay can detect is 138 copies/mL. A negative result does not preclude SARS-Cov-2 infection and should not be used as the sole basis for treatment or other patient management decisions. A negative result may occur with  improper specimen collection/handling, submission of specimen other than  nasopharyngeal swab, presence of viral mutation(s) within the areas targeted by this assay, and inadequate number of viral copies(<138 copies/mL). A negative result must be combined with clinical observations, patient history, and epidemiological information. The expected result is Negative.  Fact Sheet for Patients:  EntrepreneurPulse.com.au  Fact Sheet for Healthcare Providers:  IncredibleEmployment.be  This test is no t yet approved or cleared by the [place] FDA and  has been authorized for detection and/or diagnosis of SARS-CoV-2 by FDA under an Emergency Use Authorization (EUA). This EUA will remain  in effect (meaning this test can be used) for the duration of the COVID-19 declaration under Section 564(b)(1) of the Act, 21 U.S.C.section 360bbb-3(b)(1), unless the authorization is terminated  or revoked sooner.       Influenza A by PCR NEGATIVE NEGATIVE Final   Influenza B by PCR NEGATIVE NEGATIVE Final    Comment: (NOTE) The Xpert Xpress SARS-CoV-2/FLU/RSV plus assay is intended as an aid in the diagnosis of influenza from Nasopharyngeal swab specimens and should not be used as a sole basis for treatment. Nasal washings and aspirates are unacceptable for Xpert Xpress SARS-CoV-2/FLU/RSV testing.  Fact Sheet for Patients: EntrepreneurPulse.com.au  Fact Sheet for Healthcare Providers: IncredibleEmployment.be  This test is not yet approved or cleared by the [place] FDA and has been authorized for detection and/or diagnosis of SARS-CoV-2 by FDA under an Emergency Use Authorization (EUA). This EUA will remain in effect (meaning this test can be used) for the duration of the COVID-19 declaration under Section 564(b)(1) of the Act, 21 U.S.C. section 360bbb-3(b)(1), unless the authorization is terminated or revoked.     Resp Syncytial Virus by PCR NEGATIVE NEGATIVE Final    Comment:  (NOTE) Fact Sheet for Patients: EntrepreneurPulse.com.au  Fact Sheet for Healthcare Providers: IncredibleEmployment.be  This test is not yet approved or cleared by the [place] FDA and has been authorized for detection and/or diagnosis of SARS-CoV-2 by FDA under an Emergency Use Authorization (EUA). This EUA will remain in effect (meaning this test can be used) for the duration of the COVID-19 declaration under Section 564(b)(1) of the Act, 21 U.S.C. section 360bbb-3(b)(1), unless the authorization is terminated or revoked.  Performed at [facility], [place] [address] [place], [place] [address]   Blood Culture (routine x 2)     Status: None (Preliminary result)   Collection Time: 01/18/22 12:06 AM   Specimen: BLOOD  Result Value Ref Range Status   Specimen Description   Final    BLOOD BLOOD LEFT WRIST Performed at [facility] [address], [place], [place] [address]    Special Requests   Final    BOTTLES DRAWN AEROBIC AND ANAEROBIC Blood Culture adequate volume Performed at [facility]  [address], [place], [place] [address]    Culture   Final    NO GROWTH 2 DAYS Performed at [facility] [address], [place], [place] [address]    Report Status PENDING  Incomplete  Blood Culture (routine x 2)     Status: None (Preliminary result)   Collection Time: 01/18/22 12:11 AM   Specimen: BLOOD  Result Value Ref Range Status   Specimen Description   Final    BLOOD RIGHT ANTECUBITAL Performed at [facility] [address] [place], [place] [address]    Special Requests   Final    BOTTLES DRAWN AEROBIC AND ANAEROBIC Blood Culture adequate volume Performed at [facility] [address] [place], [place] [address]    Culture   Final    NO GROWTH 2 DAYS Performed at [facility] [address], [place], [place] [address]     Report Status PENDING  Incomplete  Urine Culture     Status: None   Collection Time: 01/18/22  2:49 AM   Specimen: In/Out Cath Urine  Result Value Ref Range Status   Specimen Description   Final    IN/OUT CATH URINE Performed at [facility] [address], [place], [place] [address]    Special Requests   Final    Normal Performed at [facility], [place] [address], [place], [place] [address]    Culture   Final    NO GROWTH Performed at [facility], [place] [address], [place], [place] [address]    Report Status 01/19/2022 FINAL  Final  MRSA Next Gen by PCR, Nasal     Status: None   Collection Time: 01/18/22  7:24 AM   Specimen: Nasal Mucosa; Nasal Swab  Result Value Ref Range Status   MRSA by PCR Next Gen NOT DETECTED NOT DETECTED Final    Comment: (NOTE) The GeneXpert MRSA Assay (FDA approved for NASAL specimens only), is one component of a comprehensive MRSA colonization surveillance program. It is not intended to diagnose MRSA infection nor to guide or monitor treatment for MRSA infections. Test performance is not FDA approved in patients less than 79 years old. Performed at [facility], [place] [address] [place], [place] [address]    *Note: Due to a large number of results and/or encounters for the requested time period, some results have not been displayed. A complete set of results can be found in Results Review.    Labs: CBC: Recent Labs  Lab 01/17/22 2341 01/18/22 0930 01/19/22 0415  WBC 22.9* 12.1* 9.1  NEUTROABS 20.6*  --   --   HGB 9.8* 7.9* 7.5*  HCT 32.6* 25.7* 24.9*  MCV 86.0 86.2 87.1  PLT 201 118* 626*   Basic Metabolic Panel: Recent Labs  Lab 01/17/22 2341 01/18/22 0211 01/18/22 0930 01/19/22 0415  NA 131* 132*  --  141  K 5.0 5.1  --  4.3  CL 96* 99  --  110  CO2 23 23  --  22  GLUCOSE 107* 98  --  85  BUN 58* 56*  --  29*  CREATININE 4.60* 4.00* 2.43* 0.86  CALCIUM 8.4* 7.7*   --  7.9*  MG  --   --   --  1.7  PHOS  --   --   --  3.0   Liver Function Tests: Recent Labs  Lab 01/18/22 0211 01/19/22 0415  AST 21  --  ALT 13  --   ALKPHOS 125  --   BILITOT 0.6  --   PROT 4.6*  --   ALBUMIN 2.1* 2.2*   CBG: No results for input(s): "GLUCAP" in the last 168 hours.  Discharge time spent: less than 30 minutes.  Signed: Marylu Lund, MD [facility] 01/20/2022

## 2022-01-20 NOTE — Evaluation (Signed)
Occupational Therapy Evaluation Patient Details Name: Brandon [name] MRN: [id number] DOB: 06-04-1940 Today's Date: 01/20/2022   History of Present Illness 81 year old M with PMH of CAD/stent to LAD in 2022, chronic back pain on chronic opiate, PMR on low-dose prednisone, COPD, HTN, BPH, anxiety, depression, portal vein thrombosis, tonsillar cancer status postradiation, esophageal stricture, and severe aortic stenosis s/p TAVR and AAA repair presenting with "feeling sick", poor p.o. intake, nausea, emesis, dizziness, weakness and fall, and admitted for severe sepsis due to aspiration pneumonia and AKI.   Clinical Impression   [name] is a 81 year old man who demonstrates the ability to perform ADLs and ambulate in room without loss of balance. He typically uses a cane or nothing at all. Today he used a walker and he has one at home if needed. Vital signs WLF during evaluation. Patient with no OT needs.       Recommendations for follow up therapy are one component of a multi-disciplinary discharge planning process, led by the attending physician.  Recommendations may be updated based on patient status, additional functional criteria and insurance authorization.   Follow Up Recommendations  No OT follow up     Assistance Recommended at Discharge PRN  Patient can return home with the following Assistance with cooking/housework    Functional Status Assessment  Patient has not had a recent decline in their functional status  Equipment Recommendations  None recommended by OT    Recommendations for Other Services       Precautions / Restrictions Precautions Precaution Comments: chronic back pain Restrictions Weight Bearing Restrictions: No      Mobility Bed Mobility Overal bed mobility: Modified Independent                  Transfers Overall transfer level: Needs assistance Equipment used: Rolling walker (2 wheels)               General transfer comment:  Supervision to ambulate to bathroom with walker, stand at sink and return to recliner. No overt loss of balance. o2 at 94% on RA      Balance Overall balance assessment: Mild deficits observed, not formally tested                                         ADL either performed or assessed with clinical judgement   ADL Overall ADL's : At baseline                                             Vision Patient Visual Report: No change from baseline       Perception     Praxis      Pertinent Vitals/Pain Pain Assessment Pain Assessment: 0-10 Pain Score: 6  Pain Location: back Pain Descriptors / Indicators: Aching Pain Intervention(s): Monitored during session, Premedicated before session     Hand Dominance Right   Extremity/Trunk Assessment Upper Extremity Assessment Upper Extremity Assessment: Overall WFL for tasks assessed   Lower Extremity Assessment Lower Extremity Assessment: Defer to PT evaluation   Cervical / Trunk Assessment Cervical / Trunk Assessment: Normal   Communication Communication Communication: HOH   Cognition Arousal/Alertness: Awake/alert Behavior During Therapy: WFL for tasks assessed/performed Overall Cognitive Status: Within Functional Limits for tasks assessed  General Comments       Exercises     Shoulder Instructions      Home Living Family/patient expects to be discharged to:: Private residence Living Arrangements: Spouse/significant other Available Help at Discharge: Family;Available 24 hours/day Type of Home: House Home Access: Stairs to enter [employer] of Steps: 4 Entrance Stairs-Rails: Can reach both Home Layout: Two level;Able to live on main level with bedroom/bathroom     Bathroom Shower/Tub: Occupational psychologist: Standard Bathroom Accessibility: Yes   Home Equipment: [place] (2  wheels);Grab bars - tub/shower;Shower seat - built in;BSC/3in1          Prior Functioning/Environment Prior Level of Function : Independent/Modified Independent;Driving;History of Falls (last six months)             Mobility Comments: Uses cane PRN otherwise holds onto furniture. Hx of several falls recently. ADLs Comments: Manages own meds, cleans, cooks, etc. Brandon [name] reports pt has had a progressive decline in cognition recently with memory deficits at baseline - per last admission        OT Problem List:        OT Treatment/Interventions:      OT Goals(Current goals can be found in the care plan section) Acute Rehab OT Goals OT Goal Formulation: All assessment and education complete, DC therapy  OT Frequency:      Co-evaluation              AM-PAC OT "6 Clicks" Daily Activity     Outcome Measure Help from another person eating meals?: None Help from another person taking care of personal grooming?: None Help from another person toileting, which includes using toliet, bedpan, or urinal?: None Help from another person bathing (including washing, rinsing, drying)?: None Help from another person to put on and taking off regular upper body clothing?: None Help from another person to put on and taking off regular lower body clothing?: None 6 Click Score: 24   End of Session Equipment Utilized During Treatment: Rolling walker (2 wheels) Nurse Communication: Mobility status  Activity Tolerance: Patient tolerated treatment well Patient left: in chair;with call bell/phone within reach;with chair alarm set  OT Visit Diagnosis: Muscle weakness (generalized) (M62.81)                Time: 4650-3546 OT Time Calculation (min): 18 min Charges:  OT General Charges $OT Visit: 1 Visit OT Evaluation $OT Eval Low Complexity: 1 Low  Brandon [name], OTR/L [place]  Office [phone]   Brandon [name] 01/20/2022, 12:07 PM

## 2022-01-22 ENCOUNTER — Telehealth: Payer: Self-pay

## 2022-01-22 NOTE — Telephone Encounter (Signed)
Patient called in because [name] has been discharged from the hospital, [name] said she would discuss weening him off the medication once he is out of the hospital over a scheduled phone call but the wife wants to know if a provider is able to speak with her about him this week in regards to weening him off the medication.

## 2022-01-22 NOTE — Telephone Encounter (Signed)
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

## 2022-01-22 NOTE — Telephone Encounter (Signed)
Reviewed Discharge Summary:  Return Brandon [name] call, Asked her about Brandon [name] medication, so we can discussed the weaning.  She states she wanted Brandon [name] to come into to the office to discuss the weaning, and stated her husband will not listen to her.  She was given an appointment for 01/23/2022 at 3:20, to arrive at 3:00. She verbalizes understanding.

## 2022-01-23 ENCOUNTER — Telehealth: Payer: Self-pay

## 2022-01-23 ENCOUNTER — Encounter: Payer: Medicare Other | Attending: Physical Medicine and Rehabilitation | Admitting: Registered Nurse

## 2022-01-23 ENCOUNTER — Encounter: Payer: Self-pay | Admitting: Registered Nurse

## 2022-01-23 VITALS — BP 137/72 | HR 88 | Ht 68.0 in | Wt 175.0 lb

## 2022-01-23 DIAGNOSIS — M47816 Spondylosis without myelopathy or radiculopathy, lumbar region: Secondary | ICD-10-CM | POA: Diagnosis not present

## 2022-01-23 DIAGNOSIS — Z5181 Encounter for therapeutic drug level monitoring: Secondary | ICD-10-CM | POA: Insufficient documentation

## 2022-01-23 DIAGNOSIS — M961 Postlaminectomy syndrome, not elsewhere classified: Secondary | ICD-10-CM | POA: Insufficient documentation

## 2022-01-23 DIAGNOSIS — G894 Chronic pain syndrome: Secondary | ICD-10-CM | POA: Diagnosis not present

## 2022-01-23 DIAGNOSIS — M5416 Radiculopathy, lumbar region: Secondary | ICD-10-CM | POA: Insufficient documentation

## 2022-01-23 DIAGNOSIS — Z79891 Long term (current) use of opiate analgesic: Secondary | ICD-10-CM | POA: Insufficient documentation

## 2022-01-23 LAB — CULTURE, BLOOD (ROUTINE X 2)
Culture: NO GROWTH
Culture: NO GROWTH
Special Requests: ADEQUATE
Special Requests: ADEQUATE

## 2022-01-23 NOTE — Patient Instructions (Addendum)
Continue Medication as Prescribed from Hospital   MS Contin: 15 mg one tablet every 12 hours'  Oxycodone 10 mg one tablet every 6 hours as needed for pain  Tramadol 50 mg one tablet one tablet every 8 hours as neeeded for pain   I will speak with [name] tomorrow and give you a call.

## 2022-01-23 NOTE — Progress Notes (Signed)
Subjective:    Patient ID: Brandon [name], male    DOB: March 02, 1940, 81 y.o.   MRN: [id number]  HPI: Brandon [name] is a 81 y.o. male who returns for follow up appointment for chronic pain and medication refill. He states his pain is located in his lower back radiating into his bilateral lower extremities R>L and bilateral hip pain L>R. Marland Kitchen He rates his pain 6. His current exercise regime is walking with his cane.   [name] was recently admitted to [facility] on 01/17/2022 for Severe Sepsis due to aspiration Pneumonia. H&P and Discharged summary was reviewed. Dr. Cyndia Skeeters spoke with Brandon [name] and his wife regarding Polypharmacy in detail.  Brandon [name] called our office a couple of times voicing concern about husbands medication and discharged summary it was noted she voiced concern about polypharmacy and husband ( Brandon [name] may be taking more medication then prescribed). See phone calls from 12/14- 12/15 and 01/22/2022. This provider return Mrs [name] call on 01/22/2022 and appointment was scheduled for 01/23/2022. [name] was going to assist with Brandon [name] weaning, she is on vacation and [name] not in office.  Spoke with Brandon [name] in detail and discuss the H&P and Discharge Summary in detail. Brandon [name] was instructed to follow discharge instructions regarding his MS Contin every 12 hors, Oxycodone every 6 hours and Tramadol 50 mg every 8 hours. The above will be discussed with [name] in the [place]. He verbalizes understanding.  Brandon [name] was educated on medication compliance and taking his medication as prescribed. He was also informed self medicating can lead to discharge from our office. He states he takes his medication as prescribed.   Brandon [name] Morphine equivalent is 160.00 MME.   Last UDS was Performed on 12/22/2021,, it was consistent for Oxycodone.   Mrs. [name] in room, all questions answered.    Pain Inventory Average Pain 6 Pain Right Now 6 My pain is sharp, dull, and aching  In the last 24 hours,  has pain interfered with the following? General activity 7 Relation with others 7 Enjoyment of life 7 What TIME of day is your pain at its worst? daytime Sleep (in general) Fair  Pain is worse with: walking, bending, standing, and some activites Pain improves with: rest and medication Relief from Meds: 7  Family History  Problem Relation Age of Onset   Heart disease Father 53       Living   Coronary artery disease Father        CABG   Alzheimer's disease Mother 29       Deceased   Arthritis Mother    Aneurysm Brother    Stomach cancer Maternal Uncle    Brain cancer Maternal Aunt        x2   Obesity Daughter        Had Bypass Sx   Social History   Socioeconomic History   Marital status: Married    Spouse name: etta   Number of children: 2   Years of education: Not on file   Highest education level: Not on file  Occupational History   Occupation: retired    Fish farm manager: RETIRED    Comment: disabled due to back problems  Tobacco Use   Smoking status: Former    Packs/day: 1.50    Years: 50.00    Total pack years: 75.00    Types: Cigarettes    Quit date: 09/05/2017    Years since quitting: 4.3  Passive exposure: Never   Smokeless tobacco: Never   Tobacco comments:    smoked less than 1 ppd for 40+ years;   Vaping Use   Vaping Use: Never used  Substance and Sexual Activity   Alcohol use: Yes    Comment: occ   Drug use: Yes    Types: Oxycodone    Comment: has prescription   Sexual activity: Not Currently  Other Topics Concern   Not on file  Social History Narrative   ** Merged History Encounter **       Married (3rd), [place]. 2 children from 1st marriage, 4 step children.    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       Cell # [phone]   Social Determinants of Health   Financial Resource Strain: Low Risk  (06/07/2021)   Overall Financial Resource Strain (CARDIA)     Difficulty of Paying Living Expenses: Not hard at all  Food Insecurity: No Food Insecurity (01/18/2022)   Hunger Vital Sign    Worried About Running Out of Food in the Last Year: Never true    Ran Out of Food in the Last Year: Never true  Transportation Needs: No Transportation Needs (01/23/2022)   PRAPARE - Hydrologist (Medical): No    Lack of Transportation (Non-Medical): No  Physical Activity: Inactive (06/07/2021)   Exercise Vital Sign    Days of Exercise per Week: 0 days    Minutes of Exercise per Session: 0 min  Stress: No Stress Concern Present (06/07/2021)   [place]    Feeling of Stress : Not at all  Social Connections: Moderately Integrated (06/07/2021)   Social Connection and Isolation Panel [NHANES]    Frequency of Communication with Friends and Family: Twice a week    Frequency of Social Gatherings with Friends and Family: Twice a week    Attends Religious Services: More than 4 times per year    Active Member of [employer] or Organizations: No    Attends Archivist Meetings: Never    Marital Status: Married   Past Surgical History:  Procedure Laterality Date   ABDOMINAL AORTIC ENDOVASCULAR STENT GRAFT N/A 03/03/2021   Procedure: ABDOMINAL AORTIC ENDOVASCULAR STENT GRAFT;  Surgeon: Waynetta Sandy, MD;  Location: [place];  Service: Vascular;  Laterality: N/A;   ABDOMINAL AORTOGRAM W/LOWER EXTREMITY N/A 04/10/2021   Procedure: ABDOMINAL AORTOGRAM W/LOWER EXTREMITY;  Surgeon: Waynetta Sandy, MD;  Location: [place] [facility];  Service: Cardiovascular;  Laterality: N/A;   BACK SURGERY     CARDIAC CATHETERIZATION     CATARACT EXTRACTION W/ INTRAOCULAR LENS  IMPLANT, BILATERAL Bilateral 01/2012 - 02/2012   CORONARY ATHERECTOMY N/A 04/27/2020   Procedure: CORONARY ATHERECTOMY;  Surgeon: Sherren Mocha, MD;  Location: [place] [facility];  Service: Cardiovascular;   Laterality: N/A;   CORONARY STENT INTERVENTION N/A 04/27/2020   Procedure: CORONARY STENT INTERVENTION;  Surgeon: Sherren Mocha, MD;  Location: [place] [facility];  Service: Cardiovascular;  Laterality: N/A;   DIRECT LARYNGOSCOPY Left 10/09/2017   Procedure: DIRECT LARYNGOSCOPY WITH BOPSY;  Surgeon: Jodi Marble, MD;  Location: [place];  Service: ENT;  Laterality: Left;   ESOPHAGOGASTRODUODENOSCOPY (EGD) WITH ESOPHAGEAL DILATION     "couple times" (07/19/2016)   ESOPHAGOSCOPY Left 10/09/2017   Procedure: ESOPHAGOSCOPY;  Surgeon: Jodi Marble, MD;  Location: [place];  Service: ENT;  Laterality: Left;   INTRAVASCULAR IMAGING/OCT N/A 04/27/2020   Procedure: INTRAVASCULAR IMAGING/OCT;  Surgeon: Sherren Mocha, MD;  Location: [place] [facility];  Service: Cardiovascular;  Laterality: N/A;   INTRAVASCULAR PRESSURE WIRE/FFR STUDY N/A 04/27/2020   Procedure: INTRAVASCULAR PRESSURE WIRE/FFR STUDY;  Surgeon: Sherren Mocha, MD;  Location: [place] [facility];  Service: Cardiovascular;  Laterality: N/A;   IR GASTROSTOMY TUBE MOD SED  01/08/2018   IR THORACENTESIS ASP PLEURAL SPACE W/IMG GUIDE  07/19/2016   LAPAROSCOPIC CHOLECYSTECTOMY  1984   LUMBAR [place] SURGERY  05/1996   L4-5; Dr. Claudean Kinds LAMINECTOMY/DECOMPRESSION MICRODISCECTOMY  10/2002   L3-4. Dr. Sherwood Gambler   MULTIPLE TOOTH EXTRACTIONS  1980s   PARTIAL GLOSSECTOMY  12/02/2017   [facility]   pharyngoplasty for closure of tingue base defect  12/02/2017   Dr. Nicolette Bang- [facility]  07/15/2016   POSTERIOR LUMBAR FUSION  09/1996   Ray cage, L4-5 Dr. Rita Ohara   PROSTATE BIOPSY  ~ 2017   radical tonsillectomy Left 12/02/2017   Dr. Nicolette Bang at [place] N/A 03/31/2020   Procedure: RIGHT HEART CATH AND CORONARY ANGIOGRAPHY;  Surgeon: Larey Dresser, MD;  Location: [place] [facility];  Service: Cardiovascular;  Laterality: N/A;   RIGID  BRONCHOSCOPY Left 10/09/2017   Procedure: RIGID BRONCHOSCOPY;  Surgeon: Jodi Marble, MD;  Location: [place];  Service: ENT;  Laterality: Left;   TEE WITHOUT CARDIOVERSION N/A 06/07/2020   Procedure: TRANSESOPHAGEAL ECHOCARDIOGRAM (TEE);  Surgeon: Sherren Mocha, MD;  Location: [place];  Service: Open Heart Surgery;  Laterality: N/A;   TONSILLECTOMY     TOTAL HIP ARTHROPLASTY Left 12/04/2020   Procedure: TOTAL HIP ARTHROPLASTY ANTERIOR APPROACH;  Surgeon: Rod Can, MD;  [facility];  Service: Orthopedics;  Laterality: Left;   TOTAL HIP ARTHROPLASTY Left 11/2020   TRACHEOSTOMY  12/02/2017   [facility]. [facility]   ULTRASOUND GUIDANCE FOR VASCULAR ACCESS Right 06/07/2020   Procedure: ULTRASOUND GUIDANCE FOR VASCULAR ACCESS;  Surgeon: Sherren Mocha, MD;  Location: [place];  Service: Open Heart Surgery;  Laterality: Right;   ULTRASOUND GUIDANCE FOR VASCULAR ACCESS Bilateral 03/03/2021   Procedure: ULTRASOUND GUIDANCE FOR VASCULAR ACCESS, BILATERAL FEMORAL ARTERIES;  Surgeon: Waynetta Sandy, MD;  Location: [place];  Service: Vascular;  Laterality: Bilateral;   VASCULAR SURGERY     Past Surgical History:  Procedure Laterality Date   ABDOMINAL AORTIC ENDOVASCULAR STENT GRAFT N/A 03/03/2021   Procedure: ABDOMINAL AORTIC ENDOVASCULAR STENT GRAFT;  Surgeon: Waynetta Sandy, MD;  Location: [place];  Service: Vascular;  Laterality: N/A;   ABDOMINAL AORTOGRAM W/LOWER EXTREMITY N/A 04/10/2021   Procedure: ABDOMINAL AORTOGRAM W/LOWER EXTREMITY;  Surgeon: Waynetta Sandy, MD;  Location: [place] [facility];  Service: Cardiovascular;  Laterality: N/A;   BACK SURGERY     CARDIAC CATHETERIZATION     CATARACT EXTRACTION W/ INTRAOCULAR LENS  IMPLANT, BILATERAL Bilateral 01/2012 - 02/2012   CORONARY ATHERECTOMY N/A 04/27/2020   Procedure: CORONARY ATHERECTOMY;  Surgeon: Sherren Mocha, MD;  Location: [place] [facility];  Service: Cardiovascular;  Laterality: N/A;    CORONARY STENT INTERVENTION N/A 04/27/2020   Procedure: CORONARY STENT INTERVENTION;  Surgeon: Sherren Mocha, MD;  Location: [place] [facility];  Service: Cardiovascular;  Laterality: N/A;   DIRECT LARYNGOSCOPY Left 10/09/2017   Procedure: DIRECT LARYNGOSCOPY WITH BOPSY;  Surgeon: Jodi Marble, MD;  Location:  [place];  Service: ENT;  Laterality: Left;   ESOPHAGOGASTRODUODENOSCOPY (EGD) WITH ESOPHAGEAL DILATION     "couple times" (07/19/2016)   ESOPHAGOSCOPY Left 10/09/2017   Procedure: ESOPHAGOSCOPY;  Surgeon: Jodi Marble, MD;  Location: [place];  Service: ENT;  Laterality: Left;   INTRAVASCULAR IMAGING/OCT N/A 04/27/2020   Procedure: INTRAVASCULAR IMAGING/OCT;  Surgeon: Sherren Mocha, MD;  Location: [place] [facility];  Service: Cardiovascular;  Laterality: N/A;   INTRAVASCULAR PRESSURE WIRE/FFR STUDY N/A 04/27/2020   Procedure: INTRAVASCULAR PRESSURE WIRE/FFR STUDY;  Surgeon: Sherren Mocha, MD;  Location: [place] [facility];  Service: Cardiovascular;  Laterality: N/A;   IR GASTROSTOMY TUBE MOD SED  01/08/2018   IR THORACENTESIS ASP PLEURAL SPACE W/IMG GUIDE  07/19/2016   LAPAROSCOPIC CHOLECYSTECTOMY  1984   LUMBAR [place] SURGERY  05/1996   L4-5; Dr. Claudean Kinds LAMINECTOMY/DECOMPRESSION MICRODISCECTOMY  10/2002   L3-4. Dr. Sherwood Gambler   MULTIPLE TOOTH EXTRACTIONS  1980s   PARTIAL GLOSSECTOMY  12/02/2017   Dr. [facility]icolette [facility]- [facility]   pharyngoplasty for closure of tingue base defect  12/02/2017   Dr. Nicolette Bang- [place]  07/15/2016   POSTERIOR LUMBAR FUSION  09/1996   Ray cage, L4-5 Dr. Rita Ohara   PROSTATE BIOPSY  ~ 2017   radical tonsillectomy Left 12/02/2017   Dr. Nicolette Bang at [place] N/A 03/31/2020   Procedure: RIGHT HEART CATH AND CORONARY ANGIOGRAPHY;  Surgeon: Larey Dresser, MD;  Location: [place] [facility];  Service: Cardiovascular;  Laterality: N/A;   RIGID BRONCHOSCOPY  Left 10/09/2017   Procedure: RIGID BRONCHOSCOPY;  Surgeon: Jodi Marble, MD;  Location: [place];  Service: ENT;  Laterality: Left;   TEE WITHOUT CARDIOVERSION N/A 06/07/2020   Procedure: TRANSESOPHAGEAL ECHOCARDIOGRAM (TEE);  Surgeon: Sherren Mocha, MD;  Location: [place];  Service: Open Heart Surgery;  Laterality: N/A;   TONSILLECTOMY     TOTAL HIP ARTHROPLASTY Left 12/04/2020   Procedure: TOTAL HIP ARTHROPLASTY ANTERIOR APPROACH;  Surgeon: Rod Can, MD;  [facility];  Service: Orthopedics;  Laterality: Left;   TOTAL HIP ARTHROPLASTY Left 11/2020   TRACHEOSTOMY  12/02/2017   Dr. 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 Burt Knack and Dr Cyndia Bent    Severe aortic stenosis    TIA (transient ischemic  attack) 1990s   hx   tonsillar ca dx'd 11/2017   Tubular adenoma of colon 2009   Ht '5\' 8"'$  (1.727 m)   Wt 175 lb (79.4 kg)   BMI 26.61 kg/m   Opioid Risk Score:   Fall Risk Score:  `1  Depression screen PHQ 2/9     01/23/2022    2:56 PM 12/13/2021   10:14 AM 12/05/2021    2:10 PM 10/18/2021    9:19 AM 10/06/2021    1:11 PM 06/07/2021    4:02 PM 06/07/2021    4:00 PM  Depression screen PHQ 2/9  Decreased Interest 0 0 0 0 0 0 0  Down, Depressed, Hopeless 0 0 0 1 0 0 0  PHQ - 2 Score 0 0 0 1 0 0 0  Altered sleeping    3     Tired, decreased energy    3     Change in appetite    1     Feeling bad or failure about yourself     0     Trouble concentrating    0     Moving slowly or fidgety/restless    0     Suicidal thoughts    0     PHQ-9 Score    8     Difficult doing work/chores   Not difficult at all Somewhat difficult         Review of Systems  Musculoskeletal:  Positive for back pain and gait problem.  All other systems reviewed and are negative.     Objective:   Physical Exam Vitals and nursing note reviewed.  Constitutional:      Appearance: Normal appearance.  Cardiovascular:     Rate and Rhythm: Regular rhythm.     Pulses: Normal pulses.     Heart sounds: Normal heart sounds.  Pulmonary:     Effort: Pulmonary effort is normal.     Breath sounds: Normal breath sounds.  Musculoskeletal:     Cervical back: Normal range of motion and neck supple.     Comments: Normal Muscle Bulk and Muscle Testing Reveals:  Upper Extremities: Full ROM and Muscle Strength 5/5 Lumbar Paraspinal Tenderness: L-3-L-5 Bilateral Greater Trochanter Tenderness Lower Extremities: Full ROM and Muscle Strength 5/5 Arises from Table with Ease Narrow Based  Gait     Skin:    General: Skin is warm and dry.  Neurological:     Mental Status: He is alert and oriented to person, place, and time.  Psychiatric:        Mood and Affect: Mood normal.        Behavior: Behavior normal.          Assessment & Plan:  Lumbar Post-Laminectomy Syndrome: Lumbar Spondylosis Continue HEP as Tolerated. Continue to Monitor.  Lumbar Radiculitis: Continue Gabapentin: Per Discharge Summary. Continue to Monitor.  Chronic Pain Syndrome: Continue MS Contin 15 mg every 12 hours,  Oxycodone 10 mg one tablet every 6 hours and Tramadol 50 mg every 8 hours . The Hospital H&P, Discharge Summary will be discussed with [name] in the morning. Mr. abhiram criado understanding.  We will continue the opioid monitoring program, this consists of regular clinic visits, examinations, urine drug screen, pill counts as well as use of [place] Controlled Substance Reporting system. A 12 month  History has been reviewed on the [place] Controlled Substance Reporting System on 01/23/2022.   Total Time Spent: > 60 minutes  F/U with [name]

## 2022-01-23 NOTE — Patient Outreach (Signed)
  Care Coordination TOC Note Transition Care Management Follow-up Telephone Call Date of discharge and from where: Brandon [name] 01/18/22-01/20/22 How have you been since you were released from the hospital? "I am feeling a lot better!" Any questions or concerns? No  Items Reviewed: Did the pt receive and understand the discharge instructions provided? Yes  Medications obtained and verified? Yes  Other? No  Any new allergies since your discharge? No  Dietary orders reviewed? Yes Do you have support at home? No   Home Care and Equipment/Supplies: Were home health services ordered? no If so, what is the name of the agency? N/A  Has the agency set up a time to come to the patient's home? not applicable Were any new equipment or medical supplies ordered?  No What is the name of the medical supply agency? N/A Were you able to get the supplies/equipment? no Do you have any questions related to the use of the equipment or supplies? No  Functional Questionnaire: (I = Independent and D = Dependent) ADLs: I  Bathing/Dressing- I  Meal Prep- I  Eating- I  Maintaining continence- I  Transferring/Ambulation- I  Managing Meds- I  Follow up appointments reviewed:  PCP Hospital f/u appt confirmed? Yes  Scheduled to see Dr. Marcello Moores on 01/23/22 @ 03:20. [facility] f/u appt confirmed? Yes  Scheduled to see Hazel Sams, PA-C on 02/19/21 @ 1:20. Are transportation arrangements needed? No  If their condition worsens, is the pt aware to call PCP or go to the Emergency Dept.? Yes Was the patient provided with contact information for the PCP's office or ED? Yes Was to pt encouraged to call back with questions or concerns? Yes  SDOH assessments and interventions completed:   Yes SDOH Interventions Today    Flowsheet Row Most Recent Value  SDOH Interventions   Housing Interventions Intervention Not Indicated  Transportation Interventions Intervention Not Indicated       Care  Coordination Interventions:  No Care Coordination interventions needed at this time.   Encounter Outcome:  Pt. Visit Completed

## 2022-01-25 ENCOUNTER — Ambulatory Visit (INDEPENDENT_AMBULATORY_CARE_PROVIDER_SITE_OTHER): Payer: Medicare Other | Admitting: Family Medicine

## 2022-01-25 ENCOUNTER — Telehealth: Payer: Self-pay | Admitting: Registered Nurse

## 2022-01-25 ENCOUNTER — Encounter: Payer: Self-pay | Admitting: Family Medicine

## 2022-01-25 VITALS — BP 146/78 | HR 95 | Temp 97.4°F | Ht 68.0 in | Wt 172.4 lb

## 2022-01-25 DIAGNOSIS — N179 Acute kidney failure, unspecified: Secondary | ICD-10-CM

## 2022-01-25 DIAGNOSIS — J441 Chronic obstructive pulmonary disease with (acute) exacerbation: Secondary | ICD-10-CM

## 2022-01-25 DIAGNOSIS — I10 Essential (primary) hypertension: Secondary | ICD-10-CM

## 2022-01-25 DIAGNOSIS — J690 Pneumonitis due to inhalation of food and vomit: Secondary | ICD-10-CM

## 2022-01-25 DIAGNOSIS — G894 Chronic pain syndrome: Secondary | ICD-10-CM | POA: Diagnosis not present

## 2022-01-25 DIAGNOSIS — M353 Polymyalgia rheumatica: Secondary | ICD-10-CM

## 2022-01-25 DIAGNOSIS — Z1211 Encounter for screening for malignant neoplasm of colon: Secondary | ICD-10-CM

## 2022-01-25 DIAGNOSIS — M25551 Pain in right hip: Secondary | ICD-10-CM | POA: Diagnosis not present

## 2022-01-25 DIAGNOSIS — G8929 Other chronic pain: Secondary | ICD-10-CM

## 2022-01-25 LAB — CBC
HCT: 28.7 % — ABNORMAL LOW (ref 39.0–52.0)
Hemoglobin: 9.1 g/dL — ABNORMAL LOW (ref 13.0–17.0)
MCHC: 31.7 g/dL (ref 30.0–36.0)
MCV: 81.2 fl (ref 78.0–100.0)
Platelets: 200 10*3/uL (ref 150.0–400.0)
RBC: 3.53 Mil/uL — ABNORMAL LOW (ref 4.22–5.81)
RDW: 16.8 % — ABNORMAL HIGH (ref 11.5–15.5)
WBC: 8.8 10*3/uL (ref 4.0–10.5)

## 2022-01-25 LAB — BASIC METABOLIC PANEL
BUN: 9 mg/dL (ref 6–23)
CO2: 30 mEq/L (ref 19–32)
Calcium: 8.1 mg/dL — ABNORMAL LOW (ref 8.4–10.5)
Chloride: 103 mEq/L (ref 96–112)
Creatinine, Ser: 0.56 mg/dL (ref 0.40–1.50)
GFR: 93.88 mL/min (ref >60.00)
Glucose, Bld: 99 mg/dL (ref 70–99)
Potassium: 3.5 mEq/L (ref 3.5–5.1)
Sodium: 141 mEq/L (ref 135–145)

## 2022-01-25 MED ORDER — MORPHINE SULFATE ER 15 MG PO TBCR: 15.0000 mg | EXTENDED_RELEASE_TABLET | Freq: Two times a day (BID) | ORAL | 0 refills | Status: DC

## 2022-01-25 NOTE — Telephone Encounter (Signed)
Spoke w/ Mr [place] and was advised of the MS contin being sent to pharmacy and further instructions given by Zella Ball.

## 2022-01-25 NOTE — Telephone Encounter (Signed)
This provider spoke with Brandon Naaman Plummer on 01/24/2022, regarding Brandon [name] recent hospital stay, the H&P and Discharge Summary.  His medications was reviewed.  His medications will be as per discharge summary MS Contin 15 mg every 12 hours: at the visit on 01/23/2022, he had 13 tablets. Oxycodone 10 mg one tablet 4 times a day as needed for pain. At the visit he had 98 Tablets/ Tramadol 50 mg one tablet every 8 hours. At  the visit he has 91 tablets.   MS Contin prescription sent to pharmacy, he has enough oxycodone and Tramadol until Brandon [name] returns next week.  She will address his medications.  Call placed to Brandon [name] regarding the above, no answer. Left message to return the call.

## 2022-01-25 NOTE — Progress Notes (Signed)
[facility] [address] [place] [address] Dept: [phone] Dept Fax: [facility] Follow-up Visit  Subjective:    Patient ID: Brandon [name], male    DOB: Jul 02, 1940, 81 y.o..   MRN: [id number]  Chief Complaint  Patient presents with   Hospitalization [facility] f/u from 01/23/22 Pneumonia.  C/o; having swallowing issues and back pain.      History of Present Illness:  Patient is in today for follow-up from his recent hospitalization. He was admitted at [facility] from 12/13-12/16/2023 with severe sepsis secondary to aspiration pneumonia. 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 He notes he is working with ST on swallowing training. Also durign his hospitalization, he was noted to have acute kidney injury and hyponatremia, which had improved by discharge.  Mr. Gladwin has a number of joint and back pain issues. He notes that he had recently seen Dr. Grandville Silos who gave him a steroid injection in his left buttocks/hip area. He notes that this helped his hip pain considerably. He is now having similar issues on the right and asks about another injection today.  Mr. Xia does see Dr. Dagoberto Ligas related to chronic pain management. He is on a a regimen of oxycodone, MS Contin, tramadol, and gabapentin for pain management. He notes that his wife raised concerns during his hospitalization regarding him taking additional pain medications beyond his prescribed amounts. He saw an NP with Dr. Florentina Jenny office earlier this week. Mr. Schultes notes that they reduced his medication. He feels like he was more functional on his previous amounts of medication. He feels the current levels will contribute to him being less active. He wonders about his options.  Mr. Dames has a history of coronary artery disease, chronic diastolic heart failure,  and hypertension. He is managed on aspirin 81 mg daily, atorvastatin 80 mg daily, ezetimibe 10 mg daily, carvedilol 12.5 mg daily, furosemide 20 mg daily and amlodipine 10 mg daily. His lisinopril 20 mg daily was stopped during the recent hospitalization due tot he AKI.    Mr. Glazebrook has a history of COPD. He is managed on albuterol and tiotropium. 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 He notes his breathign does improve after he uses his inhalers.  Past Medical History: Patient Active Problem List   Diagnosis Date Noted   At risk for polypharmacy 01/19/2022   Severe sepsis ([place]) 01/18/2022   Aspiration pneumonia ([place]) 01/18/2022   Trochanteric bursitis of left hip 01/02/2022   Dysfunction of left eustachian tube 12/05/2021   Hypotension 08/18/2021   AKI (acute kidney injury) ([place]) 08/17/2021   Hyponatremia 06/26/2021   Hyperkalemia 64/33/2951   Acute metabolic encephalopathy 88/41/6606   Myoclonic jerking 06/26/2021   Lumbar radiculopathy 06/08/2021   Lumbar postlaminectomy syndrome 05/03/2021   Degeneration of lumbar intervertebral disc 05/03/2021   Lumbar spondylosis 05/03/2021   History of hip fracture 04/24/2021   S/P abdominal aortic aneurysm repair 04/24/2021   Chronic allergic rhinitis 01/24/2021   Demand ischemia    Acute respiratory failure with hypoxia (HCC)    Elevated troponin    Chronic diastolic heart failure ([place]) 12/03/2020   Steatosis of liver 11/01/2020   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   Hepatitis B core antibody positive 11/01/2020   Benign prostatic hyperplasia 11/01/2020   Chronic obstructive pulmonary disease ([place]) 07/20/2020  History of placement of stent in LAD coronary artery 07/20/2020   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   TIA (transient ischemic attack)    Esophageal stricture    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[place]) 04/19/2020   Primary osteoarthritis  of both hands 01/11/2020   Piriformis syndrome of both sides 09/23/2019   Pain of both shoulder joints 07/29/2019   Sciatica associated with disorder of lumbar spine 04/24/2019   Primary osteoarthritis involving multiple joints 04/24/2019   Chronic pain syndrome 04/24/2019   Mixed anxiety and depressive disorder 04/15/2018   Oropharyngeal dysphagia 03/11/2018   Cancer of tonsillar fossa ([place]) 09/20/2017   Liver abscess 07/10/2016   Acute on chronic anemia 07/10/2016   Essential hypertension 05/19/2013   Psoriasis 01/13/2009   Coronary artery disease 09/15/2008   Diverticulosis of colon 08/27/2007   History of benign prostatic hyperplasia 08/27/2007   Hyperlipidemia 03/19/2007   Gastroesophageal reflux disease 03/19/2007   Past Surgical History:  Procedure Laterality Date   ABDOMINAL AORTIC ENDOVASCULAR STENT GRAFT N/A 03/03/2021   Procedure: ABDOMINAL AORTIC ENDOVASCULAR STENT GRAFT;  Surgeon: Waynetta Sandy, MD;  Location: [facility];  Service: Vascular;  Laterality: N/A;   ABDOMINAL AORTOGRAM W/LOWER EXTREMITY N/A 04/10/2021   Procedure: ABDOMINAL AORTOGRAM W/LOWER EXTREMITY;  Surgeon: Waynetta Sandy, MD;  Location: [facility];  Service: Cardiovascular;  Laterality: N/A;   BACK SURGERY     CARDIAC CATHETERIZATION     CATARACT EXTRACTION W/ INTRAOCULAR LENS  IMPLANT, BILATERAL Bilateral 01/2012 - 02/2012   CORONARY ATHERECTOMY N/A 04/27/2020   Procedure: CORONARY ATHERECTOMY;  Surgeon: Sherren Mocha, MD;  Location: [facility];  Service: Cardiovascular;  Laterality: N/A;   CORONARY STENT INTERVENTION N/A 04/27/2020   Procedure: CORONARY STENT INTERVENTION;  Surgeon: Sherren Mocha, MD;  Location: [facility];  Service: Cardiovascular;  Laterality: N/A;   DIRECT LARYNGOSCOPY Left 10/09/2017   Procedure: DIRECT LARYNGOSCOPY WITH BOPSY;  Surgeon: Jodi Marble, MD;  Location: [facility];  Service: ENT;  Laterality: Left;    ESOPHAGOGASTRODUODENOSCOPY (EGD) WITH ESOPHAGEAL DILATION     "couple times" (07/19/2016)   ESOPHAGOSCOPY Left 10/09/2017   Procedure: ESOPHAGOSCOPY;  Surgeon: Jodi Marble, MD;  Location: [facility];  Service: ENT;  Laterality: Left;   INTRAVASCULAR IMAGING/OCT N/A 04/27/2020   Procedure: INTRAVASCULAR IMAGING/OCT;  Surgeon: Sherren Mocha, MD;  Location: [facility];  Service: Cardiovascular;  Laterality: N/A;   INTRAVASCULAR PRESSURE WIRE/FFR STUDY N/A 04/27/2020   Procedure: INTRAVASCULAR PRESSURE WIRE/FFR STUDY;  Surgeon: Sherren Mocha, MD;  Location: [facility];  Service: Cardiovascular;  Laterality: N/A;   IR GASTROSTOMY TUBE MOD SED  01/08/2018   IR THORACENTESIS ASP PLEURAL SPACE W/IMG GUIDE  07/19/2016   LAPAROSCOPIC CHOLECYSTECTOMY  1984   LUMBAR [place] SURGERY  05/1996   L4-5; Dr. Claudean Kinds LAMINECTOMY/DECOMPRESSION MICRODISCECTOMY  10/2002   L3-4. Dr. Sherwood Gambler   MULTIPLE TOOTH EXTRACTIONS  1980s   PARTIAL GLOSSECTOMY  12/02/2017   Dr. Nicolette Bang- [facility]   pharyngoplasty for closure of tingue base defect  12/02/2017   Dr. Nicolette Bang- [place]  07/15/2016   POSTERIOR LUMBAR FUSION  09/1996   Ray cage, L4-5 Dr. Rita Ohara   PROSTATE BIOPSY  ~ 2017   radical tonsillectomy Left 12/02/2017   Dr. Nicolette Bang at [facility] N/A 03/31/2020   Procedure: RIGHT HEART CATH AND CORONARY ANGIOGRAPHY;  Surgeon: Larey Dresser, MD;  Location: [facility];  Service: Cardiovascular;  Laterality: N/A;   RIGID BRONCHOSCOPY Left 10/09/2017   Procedure: RIGID BRONCHOSCOPY;  Surgeon: Jodi Marble, MD;  Location: [facility];  Service: ENT;  Laterality: Left;   TEE WITHOUT CARDIOVERSION N/A 06/07/2020   Procedure: TRANSESOPHAGEAL ECHOCARDIOGRAM (TEE);  Surgeon: Sherren Mocha, MD;  Location: [facility];  Service: Open Heart Surgery;  Laterality: N/A;   TONSILLECTOMY     TOTAL HIP  ARTHROPLASTY Left 12/04/2020   Procedure: TOTAL HIP ARTHROPLASTY ANTERIOR APPROACH;  Surgeon: Rod Can, MD;  Location: [facility];  Service: Orthopedics;  Laterality: Left;   TOTAL HIP ARTHROPLASTY Left 11/2020   TRACHEOSTOMY  12/02/2017   Dr. Nicolette Bang- [facility]   ULTRASOUND GUIDANCE FOR VASCULAR ACCESS Right 06/07/2020   Procedure: ULTRASOUND GUIDANCE FOR VASCULAR ACCESS;  Surgeon: Sherren Mocha, MD;  Location: [facility];  Service: Open Heart Surgery;  Laterality: Right;   ULTRASOUND GUIDANCE FOR VASCULAR ACCESS Bilateral 03/03/2021   Procedure: ULTRASOUND GUIDANCE FOR VASCULAR ACCESS, BILATERAL FEMORAL ARTERIES;  Surgeon: Waynetta Sandy, MD;  Location: [facility] OR;  Service: Vascular;  Laterality: Bilateral;   VASCULAR SURGERY     Family History  Problem Relation Age of Onset   Heart disease Father 64       Living   Coronary artery disease Father        CABG   Alzheimer's disease Mother 79       Deceased   Arthritis Mother    Aneurysm Brother    Stomach cancer Maternal Uncle    Brain cancer Maternal Aunt        x2   Obesity Daughter        Had Bypass Sx   Outpatient Medications Prior to Visit  Medication Sig Dispense Refill   acetaminophen (TYLENOL) 500 MG tablet Take 1,000 mg by mouth every 8 (eight) hours as needed for mild pain or headache.     albuterol (VENTOLIN HFA) 108 (90 Base) MCG/ACT inhaler INHALE 2 PUFFS INTO THE LUNGS EVERY 6 HOURS AS NEEDED FOR WHEEZING OR SHORTNESS OF BREATH 6.7 g 2   allopurinol (ZYLOPRIM) 100 MG tablet TAKE 2 TABLETS(200 MG) BY MOUTH DAILY 180 tablet 0   amLODipine (NORVASC) 10 MG tablet TAKE 1 TABLET(10 MG) BY MOUTH DAILY (Patient taking differently: Take 10 mg by mouth daily.) 90 tablet 3   aspirin EC 81 MG tablet Take 81 mg by mouth at bedtime. Swallow whole.     atorvastatin (LIPITOR) 80 MG tablet Take 1 tablet (80 mg total) by mouth daily. (Patient taking differently: Take 80 mg by mouth at bedtime.) 90 tablet 3   carvedilol (COREG) 12.5 MG  tablet Take 1 tablet (12.5 mg total) by mouth 2 (two) times daily. 60 tablet 6   ezetimibe (ZETIA) 10 MG tablet Take 1 tablet (10 mg total) by mouth daily. 30 tablet 5   ferrous sulfate 325 (65 FE) MG EC tablet Take 1 tablet (325 mg total) by mouth daily with breakfast. 30 tablet 0   FLUoxetine (PROZAC) 40 MG capsule Take 1 capsule (40 mg total) by mouth daily. 30 capsule 5   fluticasone (FLONASE) 50 MCG/ACT nasal spray SHAKE LIQUID AND USE 2 SPRAYS IN EACH NOSTRIL DAILY AS NEEDED FOR RHINITIS OR ALLERGIES (Patient taking differently: Place 2 sprays into both nostrils daily.) 16 g 11   furosemide (LASIX) 20 MG tablet Take 1 tablet (20 mg total) by mouth daily. 90 tablet 3   gabapentin (  NEURONTIN) 800 MG tablet Take 1 tablet (800 mg total) by mouth 3 (three) times daily. Can increase to 4x/day if needed after 3 days- for nerve pain (Patient taking differently: Take 800 mg by mouth 3 (three) times daily.) 120 tablet 5   guaiFENesin (MUCINEX) 600 MG 12 hr tablet Take 600 mg by mouth 2 (two) times daily as needed for cough or to loosen phlegm.     hydrOXYzine (ATARAX) 10 MG tablet Take 10-20 mg by mouth at bedtime as needed for itching (or sleep).     ipratropium (ATROVENT) 0.02 % nebulizer solution Use 1 vial (0.5 mg total) by nebulization every 6 (six) hours as needed for wheezing or shortness of breath. 75 mL 12   ipratropium (ATROVENT) 0.03 % nasal spray Place 2 sprays into both nostrils every 12 (twelve) hours. 30 mL 12   levalbuterol (XOPENEX) 0.63 MG/3ML nebulizer solution Use 1 vial (0.63 mg total) by nebulization every 6 (six) hours as needed for wheezing or shortness of breath. 75 mL 12   Oxycodone HCl 10 MG TABS Take 1 tablet (10 mg total) by mouth every 6 (six) hours as needed (severe pain). Can be refilled as of 01/10/22- for chronic pain 150 tablet 0   predniSONE (DELTASONE) 5 MG tablet TAKE 1 TABLET(5 MG) BY MOUTH DAILY WITH BREAKFAST (Patient taking differently: Take 5 mg by mouth daily with  breakfast.) 30 tablet 0   tamsulosin (FLOMAX) 0.4 MG CAPS capsule Take 0.4 mg by mouth See admin instructions. Take 0.4 mg by mouth one to two times a day     Tiotropium Bromide Monohydrate (SPIRIVA RESPIMAT) 2.5 MCG/ACT AERS Inhale 2 puffs into the lungs daily. 4 g 6   traMADol (ULTRAM) 50 MG tablet Take 2 tablets (100 mg total) by mouth every 8 (eight) hours as needed for moderate pain. 120 tablet 5   triamcinolone cream (KENALOG) 0.1 % Apply 1 Application topically 2 (two) times daily as needed (to itchy sites).     trolamine salicylate (ASPERCREME) 10 % cream Apply 1 Application topically 2 (two) times daily as needed (arthritis pain).     morphine (MS CONTIN) 15 MG 12 hr tablet Take 1 tablet (15 mg total) by mouth every 12 (twelve) hours. For chronic pain- severe DJD 60 tablet 0   pantoprazole (PROTONIX) 40 MG tablet Take 1 tablet (40 mg total) by mouth daily. (Patient taking differently: Take 40 mg by mouth daily as needed (for heartburn).) 30 tablet 2   No facility-administered medications prior to visit.   Allergies  Allergen Reactions   Tape Other (See Comments)    Tears skin. Prefers paper tape, Please   Celebrex [Celecoxib] Hives and Itching    Objective:   Today's Vitals   01/25/22 0813  BP: (!) 146/78  Pulse: 95  Temp: (!) 97.4 F (36.3 C)  TempSrc: Temporal  SpO2: 96%  Weight: 172 lb 6.4 oz (78.2 kg)  Height: '5\' 8"'$  (1.727 m)   Body mass index is 26.21 kg/m.   General: Well developed, well nourished. No acute distress. Lungs:Coarse ronchi present bilaterally, but with good air movement. Extremities: 2+ edema noted. Psych: Alert and oriented. Normal mood and affect.  Health Maintenance Due  Topic Date Due   COLONOSCOPY (Pts 45-30yr Insurance coverage will need to be confirmed)  03/22/2020     Assessment & Plan:   1. Aspiration pneumonia, unspecified aspiration pneumonia type, unspecified laterality, unspecified part of lung [place] Appears to be resolving.  Agree with plan for continue  ST to try and reduce aspiration in the future. I will recheck his CBC to assure he is improving from pneumonia.  - CBC  2. Essential hypertension Blood pressure is mildly high today. His pressures have been a bit erratic over the last 6 months. Likely high today due to recent stopping of lisinopril. If renal function is improved, we might consider restarting this at his next visit.  3. Chronic right hip pain I am reluctant to give him another steroid injection so soon. Also, I am not trained in hip joint injection. He notes he will follow up with Dr. Nelva Bush (orthopedics) regarding this issue  4. Chronic pain syndrome I reviewed the recent physiatry notes. I discussed with Mr. Tauzin about the risks of increasing his opioid use on his own. I explained that a reduction in his pain meds may have been a reasonable response to reduce his risk for opioid overdose. He could consider establishing with a new pain clinic, but I don't advise that. He notes he will continue to see Dr. Dagoberto Ligas for now.  5. AKI (acute kidney injury) ([place]) I will recheck his renal function today.  - Basic metabolic panel  6. Chronic obstructive pulmonary disease with acute exacerbation (HCC) Stable. Continue inhalers.  7. 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HCC) Continues to work with rheumatology regarding PMR. Remains on prednisone 5 mg daily.  8. Screening for colon cancer He is a poor candidate for colonoscopy. We will check a Cologuard for screenign at present.  - Cologuard  Return in about 3 months (around 04/26/2022) for Reassessment.   Haydee Salter, MD

## 2022-02-06 ENCOUNTER — Other Ambulatory Visit: Payer: Self-pay | Admitting: Family Medicine

## 2022-02-06 DIAGNOSIS — H6992 Unspecified Eustachian tube disorder, left ear: Secondary | ICD-10-CM

## 2022-02-08 NOTE — Progress Notes (Signed)
Office Visit Note  Patient: Brandon [name]             Date of Birth: [birth year]           MRN: [id number]             PCP: Haydee Salter, MD Referring: Haydee Salter, MD Visit Date: 02/19/2022 Occupation: '@GUAROCC'$ @  Subjective:  Increased joint pain   History of Present Illness: Brandon [name] is a 82 y.o. male with history of PMR, gout, and osteoarthritis.  Patient is prescribed prednisone 5 mg daily.  According to the patient he has been out of his prescription for prednisone for the past 2 weeks.  He has been having a recurrence of pain in multiple joints since discontinuing prednisone.  He has had increased pain and stiffness in both hands and the right SI joint.  He denies any signs or symptoms of a gout flare.  He continues to take allopurinol 100 mg 2 tablets daily for management of gout.  He remains under the care of Dr. Dagoberto Ligas for pain management and Dr. Nelva Bush for management of his lower back discomfort including epidural steroid injections. He continues to use a cane to assist with ambulation.      Activities of Daily Living:  Patient reports morning stiffness for all day. Patient Denies nocturnal pain.  Difficulty dressing/grooming: Reports Difficulty climbing stairs: Reports Difficulty getting out of chair: Reports Difficulty using hands for taps, buttons, cutlery, and/or writing: Reports  Review of Systems  Constitutional:  Negative for fatigue.  HENT:  Negative for mouth sores and mouth dryness.   Eyes:  Negative for dryness.  Respiratory:  Negative for shortness of breath.   Cardiovascular:  Negative for chest pain and palpitations.  Gastrointestinal:  Negative for blood in stool, constipation and diarrhea.  Endocrine: Negative for increased urination.  Genitourinary:  Negative for involuntary urination.  Musculoskeletal:  Positive for joint pain, gait problem, joint pain, joint swelling, myalgias, morning stiffness, muscle tenderness and myalgias. Negative for  muscle weakness.  Skin:  Negative for color change, rash, hair loss and sensitivity to sunlight.  Allergic/Immunologic: Negative for susceptible to infections.  Neurological:  Negative for dizziness and headaches.  Hematological:  Negative for swollen glands.  Psychiatric/Behavioral:  Positive for depressed mood. Negative for sleep disturbance. The patient is not nervous/anxious.     PMFS History:  Patient Active Problem List   Diagnosis Date Noted   Chronically on opiate therapy 02/12/2022   At risk for polypharmacy 01/19/2022   Severe sepsis ([place]) 01/18/2022   Aspiration pneumonia ([place]) 01/18/2022   Trochanteric bursitis of left hip 01/02/2022   Dysfunction of left eustachian tube 12/05/2021   Hypotension 08/18/2021   AKI (acute kidney injury) ([place]) 08/17/2021   Hyponatremia 06/26/2021   Hyperkalemia 96/75/9163   Acute metabolic encephalopathy 84/66/5993   Myoclonic jerking 06/26/2021   Lumbar radiculopathy 06/08/2021   Lumbar postlaminectomy syndrome 05/03/2021   Degeneration of lumbar intervertebral disc 05/03/2021   Lumbar spondylosis 05/03/2021   History of hip fracture 04/24/2021   S/P abdominal aortic aneurysm repair 04/24/2021   Chronic allergic rhinitis 01/24/2021   Demand ischemia    Acute respiratory failure with hypoxia (HCC)    Elevated troponin    Chronic diastolic heart failure ([place]) 12/03/2020   Steatosis of liver 11/01/2020   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   Hepatitis B core antibody positive 11/01/2020   Benign prostatic hyperplasia 11/01/2020   Chronic obstructive pulmonary disease ([place]) 07/20/2020   History  of placement of stent in LAD coronary artery 07/20/2020   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   TIA (transient ischemic attack)    Esophageal stricture    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[place]) 04/19/2020   Primary osteoarthritis of both hands 01/11/2020   Piriformis syndrome of both  sides 09/23/2019   Pain of both shoulder joints 07/29/2019   Sciatica associated with disorder of lumbar spine 04/24/2019   Primary osteoarthritis involving multiple joints 04/24/2019   Chronic pain syndrome 04/24/2019   Mixed anxiety and depressive disorder 04/15/2018   Oropharyngeal dysphagia 03/11/2018   Cancer of tonsillar fossa ([place]) 09/20/2017   Liver abscess 07/10/2016   Acute on chronic anemia 07/10/2016   Essential hypertension 05/19/2013   Psoriasis 01/13/2009   Coronary artery disease 09/15/2008   Diverticulosis of colon 08/27/2007   History of benign prostatic hyperplasia 08/27/2007   Hyperlipidemia 03/19/2007   Gastroesophageal reflux disease 03/19/2007    Past Medical History:  Diagnosis Date   Abdominal aneurysm ([place])    Arthritis    "all over" (07/19/2016)   BPH (benign prostatic hypertrophy)    CAD (coronary artery disease)    Chicken pox    Chronic lower back pain    s/p surgical fusion   Depression    Diverticulosis    Esophageal stricture    GERD (gastroesophageal reflux disease)    Hepatitis B 1984   Hiatal hernia    History of radiation therapy 01/16/18- 03/05/18   Left Tonsil, 66 Gy in 33 fractions to high risk nodal echelons.    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 Burt Knack and Dr Cyndia Bent    Severe aortic stenosis    TIA (transient ischemic attack) 1990s   hx   tonsillar ca dx'd 11/2017   Tubular adenoma of colon 2009    Family History  Problem Relation Age of Onset   Heart disease Father 9       Living   Coronary artery disease Father        CABG   Alzheimer's disease Mother 28       Deceased   Arthritis Mother    Aneurysm Brother    Stomach cancer Maternal Uncle    Brain cancer Maternal Aunt        x2   Obesity Daughter        Had Bypass Sx    Past Surgical History:  Procedure Laterality Date   ABDOMINAL AORTIC ENDOVASCULAR STENT GRAFT N/A 03/03/2021   Procedure: ABDOMINAL AORTIC ENDOVASCULAR STENT GRAFT;  Surgeon: Waynetta Sandy, MD;  Location: [facility];  Service: Vascular;  Laterality: N/A;   ABDOMINAL AORTOGRAM W/LOWER EXTREMITY N/A 04/10/2021   Procedure: ABDOMINAL AORTOGRAM W/LOWER EXTREMITY;  Surgeon: Waynetta Sandy, MD;  Location: [facility];  Service: Cardiovascular;  Laterality: N/A;   BACK SURGERY     CARDIAC CATHETERIZATION     CATARACT EXTRACTION W/ INTRAOCULAR LENS  IMPLANT, BILATERAL Bilateral 01/2012 - 02/2012   CORONARY ATHERECTOMY N/A 04/27/2020   Procedure: CORONARY ATHERECTOMY;  Surgeon: Sherren Mocha, MD;  Location: [facility];  Service: Cardiovascular;  Laterality: N/A;   CORONARY STENT INTERVENTION N/A 04/27/2020   Procedure: CORONARY STENT INTERVENTION;  Surgeon: Sherren Mocha, MD;  Location: [facility];  Service: Cardiovascular;  Laterality: N/A;   DIRECT LARYNGOSCOPY Left 10/09/2017   Procedure: DIRECT LARYNGOSCOPY WITH BOPSY;  Surgeon: Jodi Marble, MD;  Location: [facility];  Service: ENT;  Laterality: Left;   ESOPHAGOGASTRODUODENOSCOPY (EGD) WITH ESOPHAGEAL DILATION     "couple times" (07/19/2016)   ESOPHAGOSCOPY Left 10/09/2017   Procedure: ESOPHAGOSCOPY;  Surgeon: Jodi Marble, MD;  Location: [facility];  Service: ENT;  Laterality: Left;   INTRAVASCULAR IMAGING/OCT N/A 04/27/2020   Procedure: INTRAVASCULAR IMAGING/OCT;  Surgeon: Sherren Mocha, MD;  Location: [facility];  Service: Cardiovascular;  Laterality: N/A;   INTRAVASCULAR PRESSURE WIRE/FFR STUDY N/A 04/27/2020   Procedure: INTRAVASCULAR PRESSURE WIRE/FFR STUDY;  Surgeon: Sherren Mocha, MD;  Location: [facility];  Service: Cardiovascular;  Laterality: N/A;   IR GASTROSTOMY TUBE MOD SED  01/08/2018   IR THORACENTESIS ASP PLEURAL SPACE W/IMG GUIDE   07/19/2016   LAPAROSCOPIC CHOLECYSTECTOMY  1984   LUMBAR [place] SURGERY  05/1996   L4-5; Dr. Claudean Kinds LAMINECTOMY/DECOMPRESSION MICRODISCECTOMY  10/2002   L3-4. Dr. Sherwood Gambler   MULTIPLE TOOTH EXTRACTIONS  1980s   PARTIAL GLOSSECTOMY  12/02/2017   Dr. Nicolette Bang- [facility]   pharyngoplasty for closure of tingue base defect  12/02/2017   Dr. Nicolette Bang- [place]  07/15/2016   POSTERIOR LUMBAR FUSION  09/1996   Ray cage, L4-5 Dr. Rita Ohara   PROSTATE BIOPSY  ~ 2017   radical tonsillectomy Left 12/02/2017   Dr. Nicolette Bang at [place] N/A 03/31/2020   Procedure: RIGHT HEART CATH AND CORONARY ANGIOGRAPHY;  Surgeon: Larey Dresser, MD;  Location:  [facility];  Service: Cardiovascular;  Laterality: N/A;   RIGID BRONCHOSCOPY Left 10/09/2017   Procedure: RIGID BRONCHOSCOPY;  Surgeon: Jodi Marble, MD;  Location: [facility];  Service: ENT;  Laterality: Left;   TEE WITHOUT CARDIOVERSION N/A 06/07/2020   Procedure: TRANSESOPHAGEAL ECHOCARDIOGRAM (TEE);  Surgeon: Sherren Mocha, MD;  Location: [facility];  Service: Open Heart Surgery;  Laterality: N/A;   TONSILLECTOMY     TOTAL HIP ARTHROPLASTY Left 12/04/2020   Procedure: TOTAL HIP ARTHROPLASTY ANTERIOR APPROACH;  Surgeon: Rod Can, MD;  Location: [facility];  Service: Orthopedics;  Laterality: Left;   TOTAL HIP ARTHROPLASTY Left 11/2020   TRACHEOSTOMY  12/02/2017   Dr. Nicolette Bang- [facility]   ULTRASOUND GUIDANCE FOR VASCULAR ACCESS Right 06/07/2020   Procedure: ULTRASOUND GUIDANCE FOR VASCULAR ACCESS;  Surgeon: Sherren Mocha, MD;  Location: [facility];  Service: Open Heart Surgery;  Laterality: Right;   ULTRASOUND GUIDANCE FOR VASCULAR ACCESS Bilateral 03/03/2021   Procedure: ULTRASOUND GUIDANCE FOR VASCULAR ACCESS, BILATERAL FEMORAL ARTERIES;  Surgeon: Waynetta Sandy, MD;  [facility];  Service: Vascular;  Laterality: Bilateral;   VASCULAR SURGERY     Social  History   Social History Narrative   ** Merged History Encounter **       Married (3rd), [place]. 2 children from 1st marriage, 4 step children.    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       Cell # [phone]   Immunization History  Administered Date(s) Administered   Fluad Quad(high Dose 65+) 10/15/2018, 11/06/2019, 10/20/2020   Influenza Split 11/26/2010, 10/11/2011, 11/03/2012   Influenza Whole 11/14/2006, 12/04/2007, 10/06/2008, 10/25/2009, 11/26/2021   Influenza, High Dose Seasonal PF 11/01/2016, 10/30/2017   Influenza,  Seasonal, Injecte, Preservative Fre 12/07/2014   Influenza,inj,Quad PF,6+ Mos 11/04/2015   Influenza-Unspecified 11/19/2013   PFIZER(Purple Top)SARS-COV-2 Vaccination 02/25/2019, 03/18/2019, 11/16/2019   Pneumococcal Conjugate-13 12/07/2014   Pneumococcal Polysaccharide-23 05/30/2017   Tdap 01/28/2020     Objective: Vital Signs: BP (!) 96/56 (BP Location: Left Arm, Patient Position: Sitting, Cuff Size: Normal)   Pulse 86   Resp 17   Ht 5' 8.5" (1.74 m)   Wt 159 lb 6.4 oz (72.3 kg)   BMI 23.88 kg/m    Physical Exam Vitals and nursing note reviewed.  Constitutional:      Appearance: He is well-developed.  HENT:     Head: Normocephalic and atraumatic.  Eyes:     Conjunctiva/sclera: Conjunctivae normal.     Pupils: Pupils are equal, round, and reactive to light.  Cardiovascular:     Rate and Rhythm: Normal rate and regular rhythm.     Heart sounds: Normal heart sounds.  Pulmonary:     Effort: Pulmonary effort is normal.     Breath sounds: Normal breath sounds.  Abdominal:     General: Bowel sounds are normal.     Palpations: Abdomen is soft.  Musculoskeletal:     Cervical back: Normal range of motion and neck supple.  Skin:    General: Skin is warm and dry.     Capillary Refill: Capillary refill takes less than 2 seconds.  Neurological:     Mental Status: He  is alert and oriented to person, place, and time.  Psychiatric:        Behavior: Behavior normal.      Musculoskeletal Exam: C-spine is limited range of motion with lateral rotation.  Thoracic kyphosis noted.  Painful range of motion of the lumbar spine.  Tenderness over the right SI joint.  Shoulder joints, elbow joints, wrist joints, MCPs, PIPs, DIPs have good range of motion with no synovitis.  PIP and DIP thickening consistent with osteoarthritis of both hands.  CMC joint prominence and mild subluxation bilaterally.  Left hip replacement has good range of motion.  Right hip has slightly limited range of motion but no groin pain currently.  No tenderness over the trochanteric bursa bilaterally.  Knee joints have good range of motion with no warmth or effusion.  Ankle joints have good range of motion with no tenderness or synovitis.  CDAI Exam: CDAI Score: -- Patient Global: --; Provider Global: -- Swollen: --; Tender: -- Joint Exam 02/19/2022   No joint exam has been documented for this visit   There is currently no information documented on the homunculus. Go to the Rheumatology activity and complete the homunculus joint exam.  Investigation: No additional findings.  Imaging: No results found.  Recent Labs: Lab Results  Component Value Date   WBC 8.8 01/25/2022   HGB 9.1 (L) 01/25/2022   PLT 200.0 01/25/2022   NA 141 01/25/2022   K 3.5 01/25/2022   CL 103 01/25/2022   CO2 30 01/25/2022   GLUCOSE 99 01/25/2022   BUN 9 01/25/2022   CREATININE 0.56 01/25/2022   BILITOT 0.6 01/18/2022   ALKPHOS 125 01/18/2022   AST 21 01/18/2022   ALT 13 01/18/2022   PROT 4.6 (L) 01/18/2022   ALBUMIN 2.2 (L) 01/19/2022   CALCIUM 8.1 (L) 01/25/2022   GFRAA 107 08/11/2020   QFTBGOLDPLUS NEGATIVE 01/11/2020    Speciality Comments:  Fosamax started on January 26, 2021.  If he has dysphagia with Fosamax or any GI intolerance we will have to switch him  to Reclast.  Procedures:  No  procedures performed Allergies: Tape and Celebrex [celecoxib]    Assessment / Plan:     Visit Diagnoses: Polymyalgia rheumatica ([place]) - History of muscle weakness and tenderness, elevated sed rate. Prednisone responsive: Patient has been taking prednisone 5 mg 1 tablet daily, but he has been out of his prescription of prednisone for the past 2 weeks.  Patient reports that he has been experiencing increased pain involving multiple joints since being off of prednisone. His pain and stiffness have been most severe in both hands, both shoulders, and right SI joint.  He has not noticed any joint swelling.  On examination no obvious synovitis was noted.  He has had some increased difficulty rising from a seated position as well as raising hands above his head.  No obvious muscular weakness was noted today.  Sed rate will be rechecked today.  ESR was WNL-11 on 10/12/21. A prescription for prednisone 5 mg 1 tablet daily was sent to the pharmacy.  If his sed rate is elevated we will discuss a prednisone taper tomorrow morning.  He was in agreement.  He plans on picking up his maintenance dose of prednisone today from the pharmacy.  He will require close follow up and lab monitoring.   Plan: Sedimentation rate  Long term (current) use of systemic steroids - Patient is currently on prednisone 5 mg 1 tablet by mouth daily.  Patient is aware of risks of long-term prednisone use.  He has had difficulty tapering prednisone in the past.  According to the patient his symptoms were better controlled when he was taking prednisone 7 mg daily.  ESR will be rechecked today.   CBC and BMP updated on 01/25/22. - Plan: CBC with Differential/Platelet, COMPLETE METABOLIC PANEL WITH GFR  Primary osteoarthritis of both hands: He has been experiencing increased pain and stiffness in both hands since being off of prednisone for the past 2 weeks.  He has PIP and DIP thickening consistent with osteoarthritis of both hands.  CMC joint  thickening and mild subluxation noted bilaterally.  Discussed the importance of joint protection and muscle strengthening. He plans to continue to use aspercreme topically as needed. A refill of prednisone 5 mg 1 tablet daily was sent to the pharmacy today.   Status post total hip replacement, left - 12/04/20: due to a fall.  Doing well.  He has good ROM of the left hip with no groin pain currently.   DDD (degenerative disc disease), lumbar - Chronic pain.  Under the care of Dr. Nelva Bush.  Patient has epidural steroid injections every 3 months.  He presents today with increased discomfort in his lower back especially over the right SI joint.  He plans on scheduling a follow-up visit with Dr. Nelva Bush for further evaluation.  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  He is clinically doing well taking allopurinol 200 mg daily.  Uric acid was 6.3 on 10/12/2021.  Uric acid level will be rechecked today.  He will remain on allopurinol as prescribed.  Advised patient to notify us if he develops a gout flare. - Plan: Uric acid  Osteopenia of multiple sites - DEXA scan 07/21/20: showed T score -2.2 in the right femoral neck, BMD 0.638. He was started on Fosamax 70 mg 1 tablet once weekly in December 2022.  No recent falls or fractures.  Patient remains on long-term prednisone.  Vitamin D deficiency  Chronic pain syndrome - He continues to follow up closely with Dr. Dagoberto Ligas.  Reviewed office visit note from 02/12/2022.  Psoriasis: He has no active psoriasis at this time.   Other medical conditions are listed as follows:   Oropharyngeal dysphagia  Liver abscess  History of diverticulitis  Aneurysm of infrarenal abdominal aorta, unspecified whether ruptured (HCC)  History of aortic valve replacement  History of  gastroesophageal reflux (GERD)  History of hyperlipidemia  Essential hypertension: BP was 96/56 today in the office.   Cancer of tonsillar fossa ([place])  Anxiety and depression  Former smoker  Orders: Orders Placed This Encounter  Procedures   CBC with Differential/Platelet   COMPLETE METABOLIC PANEL WITH GFR   Sedimentation rate   Uric acid   Meds ordered this encounter  Medications   predniSONE (DELTASONE) 5 MG tablet    Sig: TAKE 1 TABLET(5 MG) BY MOUTH DAILY WITH BREAKFAST    Dispense:  90 tablet    Refill:  0     Follow-Up Instructions: Return in about 3 months (around 05/21/2022) for Polymyalgia Rheumatica, Gout.   Ofilia Neas, PA-C  Note - This record has been created using Dragon software.  Chart creation errors have been sought, but may not always  have been located. Such creation errors do not reflect on  the standard of medical care.

## 2022-02-12 ENCOUNTER — Encounter: Payer: Self-pay | Admitting: Physical Medicine and Rehabilitation

## 2022-02-12 ENCOUNTER — Encounter
Payer: Medicare Other | Attending: Physical Medicine and Rehabilitation | Admitting: Physical Medicine and Rehabilitation

## 2022-02-12 VITALS — BP 117/61 | HR 77 | Ht 68.0 in | Wt 160.0 lb

## 2022-02-12 DIAGNOSIS — J9620 Acute and chronic respiratory failure, unspecified whether with hypoxia or hypercapnia: Secondary | ICD-10-CM | POA: Diagnosis not present

## 2022-02-12 DIAGNOSIS — M5136 Other intervertebral disc degeneration, lumbar region: Secondary | ICD-10-CM | POA: Diagnosis not present

## 2022-02-12 DIAGNOSIS — J449 Chronic obstructive pulmonary disease, unspecified: Secondary | ICD-10-CM | POA: Diagnosis not present

## 2022-02-12 DIAGNOSIS — M533 Sacrococcygeal disorders, not elsewhere classified: Secondary | ICD-10-CM | POA: Insufficient documentation

## 2022-02-12 DIAGNOSIS — G894 Chronic pain syndrome: Secondary | ICD-10-CM

## 2022-02-12 DIAGNOSIS — J189 Pneumonia, unspecified organism: Secondary | ICD-10-CM | POA: Insufficient documentation

## 2022-02-12 DIAGNOSIS — I5032 Chronic diastolic (congestive) heart failure: Secondary | ICD-10-CM | POA: Diagnosis not present

## 2022-02-12 DIAGNOSIS — Z79891 Long term (current) use of opiate analgesic: Secondary | ICD-10-CM | POA: Insufficient documentation

## 2022-02-12 DIAGNOSIS — M47816 Spondylosis without myelopathy or radiculopathy, lumbar region: Secondary | ICD-10-CM

## 2022-02-12 MED ORDER — NALOXONE HCL 4 MG/0.1ML NA LIQD: NASAL | 5 refills | Status: AC

## 2022-02-12 MED ORDER — MORPHINE SULFATE ER 15 MG PO TBCR: 15.0000 mg | EXTENDED_RELEASE_TABLET | Freq: Two times a day (BID) | ORAL | 0 refills | Status: DC

## 2022-02-12 MED ORDER — OXYCODONE HCL 10 MG PO TABS: 10.0000 mg | ORAL_TABLET | Freq: Four times a day (QID) | ORAL | 0 refills | Status: DC | PRN

## 2022-02-12 NOTE — Progress Notes (Signed)
Subjective:    Patient ID: Brandon [name], male    DOB: 1940/07/27, 82 y.o.   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 S/p L THR- 12/02/20  Getting AAA repair as of 1/23. Pt has had a recent  5th R finger fx- out of brace now.    Had another bout of pneumonia Cannot always swallow correctly- so has had pneumonia 3x in last 1 year.  When was in bed, "pooped" in bed- and then called EMS- was really sick from pneumonia. Then also voided/accident- another time.  When had another bout of pneumonia Wife concerned this episode and this past one meant "pt is addicted to pain meds". Last time had pneumonia, didn't wait so long, so didn't get as sick/have incontinence.    Last time in hospital, they stopped Morphine and reduced Oxycodone dose- was hurting so MUCH.   Spoke to PCP about treating pneumonia as soon as starts having Sx's.    Had more Oxy this past time, because trying to reduce dose-   Seeing Dr Nelva Bush at [place] caudal ESI Took care of buttock pain but now - pain moved to R side of buttocks, going down RLE more.  Not able to get ESI for 2 more months.      Pain Inventory Average Pain 5 Pain Right Now 5 My pain is intermittent, constant, sharp, burning, dull, stabbing, tingling, and aching  In the last 24 hours, has pain interfered with the following? General activity 5 Relation with others 0 Enjoyment of life 8 What TIME of day is your pain at its worst? morning  and evening Sleep (in general) Good  Pain is worse with: walking, bending, sitting, standing, and some activites Pain  improves with: rest, medication, and injections Relief from Meds: 5  Family History  Problem Relation Age of Onset   Heart disease Father 45       Living   Coronary artery disease Father        CABG   Alzheimer's disease Mother 23       Deceased   Arthritis Mother    Aneurysm Brother    Stomach cancer Maternal Uncle    Brain cancer Maternal Aunt        x2   Obesity Daughter        Had Bypass Sx   Social History   Socioeconomic History   Marital status: Married    Spouse name: etta   Number of children: 2   Years of education: Not on file   Highest education level: Not on file  Occupational History   Occupation: retired    Fish farm manager: RETIRED    Comment: disabled due to back problems  Tobacco Use   Smoking status: Former    Packs/day: 1.50    Years: 50.00    Total pack years: 75.00    Types: Cigarettes    Quit date: 09/05/2017    Years since quitting: 4.4    Passive exposure: Never   Smokeless tobacco: Never  Tobacco comments:    smoked less than 1 ppd for 40+ years;   Vaping Use   Vaping Use: Never used  Substance and Sexual Activity   Alcohol use: Yes    Comment: occ   Drug use: Yes    Types: Oxycodone    Comment: has prescription   Sexual activity: Not Currently  Other Topics Concern   Not on file  Social History Narrative   ** Merged History Encounter **       Married (3rd), [place]. 2 children from 1st marriage, 4 step children.    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       Cell # [phone]   Social Determinants of Health   Financial Resource Strain: Low Risk  (06/07/2021)   Overall Financial Resource Strain (CARDIA)    Difficulty of Paying Living Expenses: Not hard at all  Food Insecurity: No Food Insecurity (01/18/2022)   Hunger Vital Sign    Worried About Running Out of Food in the Last Year: Never true    Ran Out of Food in the Last Year: Never true   Transportation Needs: No Transportation Needs (01/23/2022)   PRAPARE - Hydrologist (Medical): No    Lack of Transportation (Non-Medical): No  Physical Activity: Inactive (06/07/2021)   Exercise Vital Sign    Days of Exercise per Week: 0 days    Minutes of Exercise per Session: 0 min  Stress: No Stress Concern Present (06/07/2021)   [place]    Feeling of Stress : Not at all  Social Connections: Moderately Integrated (06/07/2021)   Social Connection and Isolation Panel [NHANES]    Frequency of Communication with Friends and Family: Twice a week    Frequency of Social Gatherings with Friends and Family: Twice a week    Attends Religious Services: More than 4 times per year    Active Member of [employer] or Organizations: No    Attends Archivist Meetings: Never    Marital Status: Married   Past Surgical History:  Procedure Laterality Date   ABDOMINAL AORTIC ENDOVASCULAR STENT GRAFT N/A 03/03/2021   Procedure: ABDOMINAL AORTIC ENDOVASCULAR STENT GRAFT;  Surgeon: Waynetta Sandy, MD;  Location: [place];  Service: Vascular;  Laterality: N/A;   ABDOMINAL AORTOGRAM W/LOWER EXTREMITY N/A 04/10/2021   Procedure: ABDOMINAL AORTOGRAM W/LOWER EXTREMITY;  Surgeon: Waynetta Sandy, MD;  Location: [place] [facility];  Service: Cardiovascular;  Laterality: N/A;   BACK SURGERY     CARDIAC CATHETERIZATION     CATARACT EXTRACTION W/ INTRAOCULAR LENS  IMPLANT, BILATERAL Bilateral 01/2012 - 02/2012   CORONARY ATHERECTOMY N/A 04/27/2020   Procedure: CORONARY ATHERECTOMY;  Surgeon: Sherren Mocha, MD;  Location: [place] [facility];  Service: Cardiovascular;  Laterality: N/A;   CORONARY STENT INTERVENTION N/A 04/27/2020   Procedure: CORONARY STENT INTERVENTION;  Surgeon: Sherren Mocha, MD;  Location: [place] [facility];  Service: Cardiovascular;  Laterality: N/A;   DIRECT LARYNGOSCOPY Left  10/09/2017   Procedure: DIRECT LARYNGOSCOPY WITH BOPSY;  Surgeon: Jodi Marble, MD;  Location: [place];  Service: ENT;  Laterality: Left;   ESOPHAGOGASTRODUODENOSCOPY (EGD) WITH ESOPHAGEAL DILATION     "couple times" (07/19/2016)   ESOPHAGOSCOPY Left 10/09/2017   Procedure: ESOPHAGOSCOPY;  Surgeon: Jodi Marble, MD;  Location: [place];  Service: ENT;  Laterality:  Left;   INTRAVASCULAR IMAGING/OCT N/A 04/27/2020   Procedure: INTRAVASCULAR IMAGING/OCT;  Surgeon: Sherren Mocha, MD;  Location: [place] [facility];  Service: Cardiovascular;  Laterality: N/A;   INTRAVASCULAR PRESSURE WIRE/FFR STUDY N/A 04/27/2020   Procedure: INTRAVASCULAR PRESSURE WIRE/FFR STUDY;  Surgeon: Sherren Mocha, MD;  Location: [place] [facility];  Service: Cardiovascular;  Laterality: N/A;   IR GASTROSTOMY TUBE MOD SED  01/08/2018   IR THORACENTESIS ASP PLEURAL SPACE W/IMG GUIDE  07/19/2016   LAPAROSCOPIC CHOLECYSTECTOMY  1984   LUMBAR [place] SURGERY  05/1996   L4-5; Dr. Claudean Kinds LAMINECTOMY/DECOMPRESSION MICRODISCECTOMY  10/2002   L3-4. Dr. Sherwood Gambler   MULTIPLE TOOTH EXTRACTIONS  1980s   PARTIAL GLOSSECTOMY  12/02/2017   Dr. [facility]ico[facility]- [facility]   pharyngoplasty for closure of tingue base defect  12/02/2017   Dr. Nicolette Bang- [place]  07/15/2016   POSTERIOR LUMBAR FUSION  09/1996   Ray cage, L4-5 Dr. Rita Ohara   PROSTATE BIOPSY  ~ 2017   radical tonsillectomy Left 12/02/2017   Dr. Nicolette Bang at [place] N/A 03/31/2020   Procedure: RIGHT HEART CATH AND CORONARY ANGIOGRAPHY;  Surgeon: Larey Dresser, MD;  Location: [place] [facility];  Service: Cardiovascular;  Laterality: N/A;   RIGID BRONCHOSCOPY Left 10/09/2017   Procedure: RIGID BRONCHOSCOPY;  Surgeon: Jodi Marble, MD;  Location: [place];  Service: ENT;  Laterality: Left;   TEE WITHOUT CARDIOVERSION N/A 06/07/2020   Procedure: TRANSESOPHAGEAL  ECHOCARDIOGRAM (TEE);  Surgeon: Sherren Mocha, MD;  Location: [place];  Service: Open Heart Surgery;  Laterality: N/A;   TONSILLECTOMY     TOTAL HIP ARTHROPLASTY Left 12/04/2020   Procedure: TOTAL HIP ARTHROPLASTY ANTERIOR APPROACH;  Surgeon: Rod Can, MD;  [facility];  Service: Orthopedics;  Laterality: Left;   TOTAL HIP ARTHROPLASTY Left 11/2020   TRACHEOSTOMY  12/02/2017   Dr. [facility]ico[facility]- [facility]   ULTRASOUND GUIDANCE FOR VASCULAR ACCESS Right 06/07/2020   Procedure: ULTRASOUND GUIDANCE FOR VASCULAR ACCESS;  Surgeon: Sherren Mocha, MD;  Location: [place];  Service: Open Heart Surgery;  Laterality: Right;   ULTRASOUND GUIDANCE FOR VASCULAR ACCESS Bilateral 03/03/2021   Procedure: ULTRASOUND GUIDANCE FOR VASCULAR ACCESS, BILATERAL FEMORAL ARTERIES;  Surgeon: Waynetta Sandy, MD;  Location: [place];  Service: Vascular;  Laterality: Bilateral;   VASCULAR SURGERY     Past Surgical History:  Procedure Laterality Date   ABDOMINAL AORTIC ENDOVASCULAR STENT GRAFT N/A 03/03/2021   Procedure: ABDOMINAL AORTIC ENDOVASCULAR STENT GRAFT;  Surgeon: Waynetta Sandy, MD;  Location: [place];  Service: Vascular;  Laterality: N/A;   ABDOMINAL AORTOGRAM W/LOWER EXTREMITY N/A 04/10/2021   Procedure: ABDOMINAL AORTOGRAM W/LOWER EXTREMITY;  Surgeon: Waynetta Sandy, MD;  Location: [place] [facility];  Service: Cardiovascular;  Laterality: N/A;   BACK SURGERY     CARDIAC CATHETERIZATION     CATARACT EXTRACTION W/ INTRAOCULAR LENS  IMPLANT, BILATERAL Bilateral 01/2012 - 02/2012   CORONARY ATHERECTOMY N/A 04/27/2020   Procedure: CORONARY ATHERECTOMY;  Surgeon: Sherren Mocha, MD;  Location: [place] [facility];  Service: Cardiovascular;  Laterality: N/A;   CORONARY STENT INTERVENTION N/A 04/27/2020   Procedure: CORONARY STENT INTERVENTION;  Surgeon: Sherren Mocha, MD;  Location: [place] [facility];  Service: Cardiovascular;  Laterality: N/A;   DIRECT LARYNGOSCOPY Left 10/09/2017    Procedure: DIRECT LARYNGOSCOPY WITH BOPSY;  Surgeon: Jodi Marble, MD;  Location: [place];  Service: ENT;  Laterality: Left;  ESOPHAGOGASTRODUODENOSCOPY (EGD) WITH ESOPHAGEAL DILATION     "couple times" (07/19/2016)   ESOPHAGOSCOPY Left 10/09/2017   Procedure: ESOPHAGOSCOPY;  Surgeon: Jodi Marble, MD;  Location: [place];  Service: ENT;  Laterality: Left;   INTRAVASCULAR IMAGING/OCT N/A 04/27/2020   Procedure: INTRAVASCULAR IMAGING/OCT;  Surgeon: Sherren Mocha, MD;  Location: [place] [facility];  Service: Cardiovascular;  Laterality: N/A;   INTRAVASCULAR PRESSURE WIRE/FFR STUDY N/A 04/27/2020   Procedure: INTRAVASCULAR PRESSURE WIRE/FFR STUDY;  Surgeon: Sherren Mocha, MD;  Location: [place] [facility];  Service: Cardiovascular;  Laterality: N/A;   IR GASTROSTOMY TUBE MOD SED  01/08/2018   IR THORACENTESIS ASP PLEURAL SPACE W/IMG GUIDE  07/19/2016   LAPAROSCOPIC CHOLECYSTECTOMY  1984   LUMBAR [place] SURGERY  05/1996   L4-5; Dr. Claudean Kinds LAMINECTOMY/DECOMPRESSION MICRODISCECTOMY  10/2002   L3-4. Dr. Sherwood Gambler   MULTIPLE TOOTH EXTRACTIONS  1980s   PARTIAL GLOSSECTOMY  12/02/2017   Dr. Nicolette Bang- [facility]   pharyngoplasty for closure of tingue base defect  12/02/2017   Dr. Nicolette Bang- [place]  07/15/2016   POSTERIOR LUMBAR FUSION  09/1996   Ray cage, L4-5 Dr. Rita Ohara   PROSTATE BIOPSY  ~ 2017   radical tonsillectomy Left 12/02/2017   Dr. Nicolette Bang at [place] N/A 03/31/2020   Procedure: RIGHT HEART CATH AND CORONARY ANGIOGRAPHY;  Surgeon: Larey Dresser, MD;  Location: [place] [facility];  Service: Cardiovascular;  Laterality: N/A;   RIGID BRONCHOSCOPY Left 10/09/2017   Procedure: RIGID BRONCHOSCOPY;  Surgeon: Jodi Marble, MD;  Location: [place];  Service: ENT;  Laterality: Left;   TEE WITHOUT CARDIOVERSION N/A 06/07/2020   Procedure: TRANSESOPHAGEAL ECHOCARDIOGRAM  (TEE);  Surgeon: Sherren Mocha, MD;  Location: [place];  Service: Open Heart Surgery;  Laterality: N/A;   TONSILLECTOMY     TOTAL HIP ARTHROPLASTY Left 12/04/2020   Procedure: TOTAL HIP ARTHROPLASTY ANTERIOR APPROACH;  Surgeon: Rod Can, MD;  [facility];  Service: Orthopedics;  Laterality: Left;   TOTAL HIP ARTHROPLASTY Left 11/2020   TRACHEOSTOMY  12/02/2017   Dr. [facility]icolette [facility]- [facility]   ULTRASOUND GUIDANCE FOR VASCULAR ACCESS Right 06/07/2020   Procedure: ULTRASOUND GUIDANCE FOR VASCULAR ACCESS;  Surgeon: Sherren Mocha, MD;  Location: [place];  Service: Open Heart Surgery;  Laterality: Right;   ULTRASOUND GUIDANCE FOR VASCULAR ACCESS Bilateral 03/03/2021   Procedure: ULTRASOUND GUIDANCE FOR VASCULAR ACCESS, BILATERAL FEMORAL ARTERIES;  Surgeon: Waynetta Sandy, MD;  Location: [place];  Service: Vascular;  Laterality: Bilateral;   VASCULAR SURGERY     Past Medical History:  Diagnosis Date   Abdominal aneurysm ([place])    Arthritis    "all over" (07/19/2016)   BPH (benign prostatic hypertrophy)    CAD (coronary artery disease)    Chicken pox    Chronic lower back pain    s/p surgical fusion   Depression    Diverticulosis    Esophageal stricture    GERD (gastroesophageal reflux disease)    Hepatitis B 1984   Hiatal hernia    History of radiation therapy 01/16/18- 03/05/18   Left Tonsil, 66 Gy in 33 fractions to high risk nodal echelons.    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 Burt Knack  and Dr Cyndia Bent    Severe aortic stenosis    TIA (transient ischemic attack) 1990s   hx   tonsillar ca dx'd 11/2017   Tubular adenoma of colon 2009   BP 117/61   Pulse 77   Ht '5\' 8"'$  (1.727 m)   Wt 160 lb (72.6 kg)   SpO2 91%   BMI 24.33 kg/m   Opioid Risk Score:   Fall Risk Score:   `1  Depression screen PHQ 2/9     02/12/2022    1:16 PM 01/23/2022    2:56 PM 12/13/2021   10:14 AM 12/05/2021    2:10 PM 10/18/2021    9:19 AM 10/06/2021    1:11 PM 06/07/2021    4:02 PM  Depression screen PHQ 2/9  Decreased Interest 1 0 0 0 0 0 0  Down, Depressed, Hopeless 1 0 0 0 1 0 0  PHQ - 2 Score 2 0 0 0 1 0 0  Altered sleeping     3    Tired, decreased energy     3    Change in appetite     1    Feeling bad or failure about yourself      0    Trouble concentrating     0    Moving slowly or fidgety/restless     0    Suicidal thoughts     0    PHQ-9 Score     8    Difficult doing work/chores    Not difficult at all Somewhat difficult      Review of Systems  Musculoskeletal:  Positive for back pain and gait problem.       Left hip pain  All other systems reviewed and are negative.      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 S/p L THR- 12/02/20  Getting AAA as of 1/23.   Is not drinking alcohol. Made clear that cannot on pain meds  2. Will prescribe Naloxone- to make sure has if has accidental overdose.    3. Will con't MS Contin 15 mg BID- #60- last refill 12/21- con't regimen- send in pain meds   4. Con't Oxycodone 10 mg q4 hours prn # 150- last refill 12/16- will refill today  5. Con't to see Dr Nelva Bush for ESI's  6. Con't drug counting; con't UDS 3x/year  minimum and monitoring PDMP.   7. Ran out of Prednisone 3 days ago- has appt "in the works". - hurting more without prednisone- so waiting to get in for appt.    8. Have to be upright completely to take food or drink- and take a bite, take and sip- alternate- to reduce chance of pneumonia.   9. F/U 3 months on chronic pain

## 2022-02-12 NOTE — Patient Instructions (Signed)
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 S/p L THR- 12/02/20  Getting AAA as of 1/23.   Is not drinking alcohol. Made clear that cannot on pain meds  2. Will prescribe Naloxone- to make sure has if has accidental overdose.    3. Will con't MS Contin 15 mg BID- #60- last refill 12/21- con't regimen- send in pain meds   4. Con't Oxycodone 10 mg q4 hours prn # 150- last refill 12/16- will refill today  5. Con't to see Dr Nelva Bush for ESI's  6. Con't drug counting; con't UDS 3x/year minimum and monitoring PDMP.   7. Ran out of Prednisone 3 days ago- has appt "in the works". - hurting more without prednisone- so waiting to get in for appt.    8. Have to be upright completely to take food or drink- and take a bite, take and sip- alternate- to reduce chance of pneumonia.   9. F/U 3 months on chronic pain

## 2022-02-13 LAB — COLOGUARD

## 2022-02-14 DIAGNOSIS — M533 Sacrococcygeal disorders, not elsewhere classified: Secondary | ICD-10-CM | POA: Diagnosis not present

## 2022-02-19 ENCOUNTER — Encounter: Payer: Self-pay | Admitting: Physician Assistant

## 2022-02-19 ENCOUNTER — Ambulatory Visit: Payer: Medicare Other | Attending: Physician Assistant | Admitting: Physician Assistant

## 2022-02-19 VITALS — BP 96/56 | HR 86 | Resp 17 | Ht 68.5 in | Wt 159.4 lb

## 2022-02-19 DIAGNOSIS — I10 Essential (primary) hypertension: Secondary | ICD-10-CM

## 2022-02-19 DIAGNOSIS — G894 Chronic pain syndrome: Secondary | ICD-10-CM

## 2022-02-19 DIAGNOSIS — Z8639 Personal history of other endocrine, nutritional and metabolic disease: Secondary | ICD-10-CM

## 2022-02-19 DIAGNOSIS — M19041 Primary osteoarthritis, right hand: Secondary | ICD-10-CM

## 2022-02-19 DIAGNOSIS — K750 Abscess of liver: Secondary | ICD-10-CM

## 2022-02-19 DIAGNOSIS — M51369 Other intervertebral disc degeneration, lumbar region without mention of lumbar back pain or lower extremity pain: Secondary | ICD-10-CM

## 2022-02-19 DIAGNOSIS — I7143 Infrarenal abdominal aortic aneurysm, without rupture: Secondary | ICD-10-CM

## 2022-02-19 DIAGNOSIS — Z96642 Presence of left artificial hip joint: Secondary | ICD-10-CM

## 2022-02-19 DIAGNOSIS — M1A0210 Idiopathic chronic gout, right elbow, without tophus (tophi): Secondary | ICD-10-CM | POA: Diagnosis not present

## 2022-02-19 DIAGNOSIS — Z8719 Personal history of other diseases of the digestive system: Secondary | ICD-10-CM

## 2022-02-19 DIAGNOSIS — L409 Psoriasis, unspecified: Secondary | ICD-10-CM

## 2022-02-19 DIAGNOSIS — C090 Malignant neoplasm of tonsillar fossa: Secondary | ICD-10-CM

## 2022-02-19 DIAGNOSIS — Z7952 Long term (current) use of systemic steroids: Secondary | ICD-10-CM

## 2022-02-19 DIAGNOSIS — M353 Polymyalgia rheumatica: Secondary | ICD-10-CM | POA: Diagnosis not present

## 2022-02-19 DIAGNOSIS — M19042 Primary osteoarthritis, left hand: Secondary | ICD-10-CM

## 2022-02-19 DIAGNOSIS — Z87891 Personal history of nicotine dependence: Secondary | ICD-10-CM

## 2022-02-19 DIAGNOSIS — E559 Vitamin D deficiency, unspecified: Secondary | ICD-10-CM

## 2022-02-19 DIAGNOSIS — F419 Anxiety disorder, unspecified: Secondary | ICD-10-CM

## 2022-02-19 DIAGNOSIS — M5136 Other intervertebral disc degeneration, lumbar region: Secondary | ICD-10-CM

## 2022-02-19 DIAGNOSIS — R1312 Dysphagia, oropharyngeal phase: Secondary | ICD-10-CM

## 2022-02-19 DIAGNOSIS — F32A Depression, unspecified: Secondary | ICD-10-CM

## 2022-02-19 DIAGNOSIS — Z952 Presence of prosthetic heart valve: Secondary | ICD-10-CM

## 2022-02-19 DIAGNOSIS — M8589 Other specified disorders of bone density and structure, multiple sites: Secondary | ICD-10-CM

## 2022-02-19 MED ORDER — PREDNISONE 5 MG PO TABS: ORAL_TABLET | ORAL | 0 refills | Status: DC

## 2022-02-20 ENCOUNTER — Other Ambulatory Visit: Payer: Self-pay | Admitting: *Deleted

## 2022-02-20 LAB — COMPLETE METABOLIC PANEL WITH GFR
AG Ratio: 1.4 (calc) (ref 1.0–2.5)
ALT: 23 U/L (ref 9–46)
AST: 16 U/L (ref 10–35)
Albumin: 3.3 g/dL — ABNORMAL LOW (ref 3.6–5.1)
Alkaline phosphatase (APISO): 154 U/L — ABNORMAL HIGH (ref 35–144)
BUN/Creatinine Ratio: 36 (calc) — ABNORMAL HIGH (ref 6–22)
BUN: 33 mg/dL — ABNORMAL HIGH (ref 7–25)
CO2: 27 mmol/L (ref 20–32)
Calcium: 8.7 mg/dL (ref 8.6–10.3)
Chloride: 100 mmol/L (ref 98–110)
Creat: 0.91 mg/dL (ref 0.70–1.28)
Globulin: 2.4 g/dL (calc) (ref 1.9–3.7)
Glucose, Bld: 139 mg/dL — ABNORMAL HIGH (ref 65–99)
Potassium: 5.5 mmol/L — ABNORMAL HIGH (ref 3.5–5.3)
Sodium: 135 mmol/L (ref 135–146)
Total Bilirubin: 0.6 mg/dL (ref 0.2–1.2)
Total Protein: 5.7 g/dL — ABNORMAL LOW (ref 6.1–8.1)
eGFR: 86 mL/min/{1.73_m2} (ref >=60)

## 2022-02-20 LAB — CBC WITH DIFFERENTIAL/PLATELET
Absolute Monocytes: 749 cells/uL (ref 200–950)
Basophils Absolute: 21 cells/uL (ref 0–200)
Basophils Relative: 0.2 %
Eosinophils Absolute: 214 cells/uL (ref 15–500)
Eosinophils Relative: 2 %
HCT: 31.6 % — ABNORMAL LOW (ref 38.5–50.0)
Hemoglobin: 9.9 g/dL — ABNORMAL LOW (ref 13.2–17.1)
Lymphs Abs: 1338 cells/uL (ref 850–3900)
MCH: 25.9 pg — ABNORMAL LOW (ref 27.0–33.0)
MCHC: 31.3 g/dL — ABNORMAL LOW (ref 32.0–36.0)
MCV: 82.7 fL (ref 80.0–100.0)
MPV: 10.2 fL (ref 7.5–12.5)
Monocytes Relative: 7 %
Neutro Abs: 8378 cells/uL — ABNORMAL HIGH (ref 1500–7800)
Neutrophils Relative %: 78.3 %
Platelets: 197 10*3/uL (ref 140–400)
RBC: 3.82 10*6/uL — ABNORMAL LOW (ref 4.20–5.80)
RDW: 15.1 % — ABNORMAL HIGH (ref 11.0–15.0)
Total Lymphocyte: 12.5 %
WBC: 10.7 10*3/uL (ref 3.8–10.8)

## 2022-02-20 LAB — SEDIMENTATION RATE: Sed Rate: 39 mm/h — ABNORMAL HIGH (ref 0–20)

## 2022-02-20 LAB — URIC ACID: Uric Acid, Serum: 5.8 mg/dL (ref 4.0–8.0)

## 2022-02-20 MED ORDER — PREDNISONE 1 MG PO TABS: 3.0000 mg | ORAL_TABLET | Freq: Every day | ORAL | 0 refills | Status: DC

## 2022-02-20 NOTE — Progress Notes (Signed)
Anemia is stable.  Uric acid is within the desirable range.  Glucose is elevated-139.  Potassium is elevated-5.5. patient will need to have potassium rechecked by PCP. Alk phos is elevated.   ESR is elevated-39.  Please advise patient to increase prednisone to 8 mg daily.   A refill of prednisone 1 mg tablets will need to be sent to the pharmacy.    A refill of prednisone 5 mg tablets was sent to the pharmacy yesterday.   Continue on 8 mg and taper by 1 mg every 2 months.

## 2022-02-20 NOTE — Telephone Encounter (Signed)
-----  Message from Ofilia Neas, PA-C sent at 02/20/2022 12:12 PM EST ----- Anemia is stable.  Uric acid is within the desirable range.  Glucose is elevated-139.  Potassium is elevated-5.5. patient will need to have potassium rechecked by PCP. Alk phos is elevated.   ESR is elevated-39.  Please advise patient to increase prednisone to 8 mg daily.   A refill of prednisone 1 mg tablets will need to be sent to the pharmacy.    A refill of prednisone 5 mg tablets was sent to the pharmacy yesterday.   Continue on 8 mg and taper by 1 mg every 2 months.

## 2022-02-26 ENCOUNTER — Ambulatory Visit: Payer: Medicare Other | Admitting: Physical Medicine and Rehabilitation

## 2022-02-27 ENCOUNTER — Telehealth: Payer: Self-pay | Admitting: Physical Medicine and Rehabilitation

## 2022-02-27 MED ORDER — MORPHINE SULFATE ER 15 MG PO TBCR: 15.0000 mg | EXTENDED_RELEASE_TABLET | Freq: Two times a day (BID) | ORAL | 0 refills | Status: DC

## 2022-02-27 NOTE — Telephone Encounter (Signed)
Patient needs a refill on Morphine. 

## 2022-03-09 ENCOUNTER — Encounter: Payer: Self-pay | Admitting: Family Medicine

## 2022-03-09 ENCOUNTER — Ambulatory Visit (INDEPENDENT_AMBULATORY_CARE_PROVIDER_SITE_OTHER): Payer: Medicare Other | Admitting: Family Medicine

## 2022-03-09 VITALS — BP 122/60 | HR 79 | Temp 96.9°F | Ht 68.5 in | Wt 158.0 lb

## 2022-03-09 DIAGNOSIS — J439 Emphysema, unspecified: Secondary | ICD-10-CM

## 2022-03-09 DIAGNOSIS — I959 Hypotension, unspecified: Secondary | ICD-10-CM | POA: Diagnosis not present

## 2022-03-09 DIAGNOSIS — J449 Chronic obstructive pulmonary disease, unspecified: Secondary | ICD-10-CM | POA: Diagnosis not present

## 2022-03-09 DIAGNOSIS — I10 Essential (primary) hypertension: Secondary | ICD-10-CM

## 2022-03-09 MED ORDER — ALBUTEROL SULFATE HFA 108 (90 BASE) MCG/ACT IN AERS: 2.0000 | INHALATION_SPRAY | Freq: Four times a day (QID) | RESPIRATORY_TRACT | 2 refills | Status: DC | PRN

## 2022-03-09 MED ORDER — AMLODIPINE BESYLATE 5 MG PO TABS: 5.0000 mg | ORAL_TABLET | Freq: Every day | ORAL | 3 refills | Status: DC

## 2022-03-09 NOTE — Assessment & Plan Note (Signed)
COPD is stable. I will renew his albuterol. He should also continue his Spiriva inhaler. We did discuss him presenting with signs of acute exacerbation of his COPD so we can hopefully head off a hospitalization.

## 2022-03-09 NOTE — Assessment & Plan Note (Signed)
Blood pressure shows a mildly low diastolic. This may be contributing to his orthostatic symptoms. I will reduce his amlodipine to 5 mg daily.

## 2022-03-09 NOTE — Assessment & Plan Note (Signed)
Will try reducing his amlodipine dose.

## 2022-03-09 NOTE — Progress Notes (Signed)
[facility] [address] [place] [address] Dept: [phone] Dept Fax: [phone]  Chronic Care Office Visit  Subjective:    Patient ID: Brandon [name], male    DOB: 1940-05-04, 82 y.o..   MRN: [id number]  Chief Complaint  Patient presents with   Follow-up    1 month FU, concerns with vertigo    History of Present Illness:  Patient is in today for reassessment of chronic medical issues.  Mr. Cerny has a history of COPD. He is managed on albuterol and tiotropium. 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 He notes his breathing does improve after he uses his inhalers. He feels he is better able to get sputum up from his chest. He has concerns about repeatedly ending up in the hospital with pneumonia.s He feels he may wait too late when he starts to have symptoms. He wants to know if he can be seen more urgently when his symptoms start.  Mr. Patras has a history of coronary artery disease, chronic diastolic heart failure, and hypertension. He is managed on aspirin 81 mg daily, atorvastatin 80 mg daily, ezetimibe 10 mg daily, carvedilol 12.5 mg daily, furosemide 20 mg daily and amlodipine 10 mg daily. His lisinopril 20 mg daily was stopped during the recent hospitalization due to  AKI.  He is noting periodic issues with lightheadedness with standing. He has not had any falls, but he is afraid of this happening.  Past Medical History: Patient Active Problem List   Diagnosis Date Noted   Chronically on opiate therapy 02/12/2022   Sacroiliac joint pain 02/12/2022   At risk for polypharmacy 01/19/2022   Severe sepsis ([place]) 01/18/2022   Aspiration pneumonia ([place]) 01/18/2022   Trochanteric bursitis of left hip 01/02/2022   Dysfunction of left eustachian tube 12/05/2021   Hypotension 08/18/2021   AKI (acute kidney injury) ([place]) 08/17/2021   Hyponatremia 06/26/2021   Hyperkalemia 87/56/4332   Acute metabolic  encephalopathy 95/18/8416   Myoclonic jerking 06/26/2021   Lumbar radiculopathy 06/08/2021   Lumbar postlaminectomy syndrome 05/03/2021   Degeneration of lumbar intervertebral disc 05/03/2021   Lumbar spondylosis 05/03/2021   History of hip fracture 04/24/2021   S/P abdominal aortic aneurysm repair 04/24/2021   Chronic allergic rhinitis 01/24/2021   Demand ischemia    Acute respiratory failure with hypoxia (HCC)    Elevated troponin    Chronic diastolic heart failure ([place]) 12/03/2020   Steatosis of liver 11/01/2020   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   Hepatitis B core antibody positive 11/01/2020   Benign prostatic hyperplasia 11/01/2020   Chronic obstructive pulmonary disease ([place]) 07/20/2020   History of placement of stent in LAD coronary artery 07/20/2020   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   TIA (transient ischemic attack)    Esophageal stricture    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   Primary osteoarthritis of both hands 01/11/2020   Piriformis syndrome of both sides 09/23/2019   Pain of both shoulder joints 07/29/2019   Sciatica associated with disorder of lumbar spine 04/24/2019   Primary osteoarthritis involving multiple joints 04/24/2019   Chronic pain syndrome 04/24/2019   Mixed anxiety and depressive disorder 04/15/2018   Oropharyngeal dysphagia 03/11/2018   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/20/2017   Liver abscess 07/10/2016   Acute on chronic anemia 07/10/2016   Essential hypertension 05/19/2013   Psoriasis 01/13/2009   Coronary artery disease 09/15/2008  Diverticulosis of colon 08/27/2007   History of benign prostatic hyperplasia 08/27/2007   Hyperlipidemia 03/19/2007   Gastroesophageal reflux disease 03/19/2007   Past Surgical History:  Procedure Laterality Date   ABDOMINAL AORTIC ENDOVASCULAR STENT GRAFT N/A 03/03/2021   Procedure: ABDOMINAL AORTIC ENDOVASCULAR STENT GRAFT;   Surgeon: Waynetta Sandy, MD;  Location: [place];  Service: Vascular;  Laterality: N/A;   ABDOMINAL AORTOGRAM W/LOWER EXTREMITY N/A 04/10/2021   Procedure: ABDOMINAL AORTOGRAM W/LOWER EXTREMITY;  Surgeon: Waynetta Sandy, MD;  Location: [facility];  Service: Cardiovascular;  Laterality: N/A;   BACK SURGERY     CARDIAC CATHETERIZATION     CATARACT EXTRACTION W/ INTRAOCULAR LENS  IMPLANT, BILATERAL Bilateral 01/2012 - 02/2012   CORONARY ATHERECTOMY N/A 04/27/2020   Procedure: CORONARY ATHERECTOMY;  Surgeon: Sherren Mocha, MD;  Location: [facility];  Service: Cardiovascular;  Laterality: N/A;   CORONARY STENT INTERVENTION N/A 04/27/2020   Procedure: CORONARY STENT INTERVENTION;  Surgeon: Sherren Mocha, MD;  Location: [facility];  Service: Cardiovascular;  Laterality: N/A;   DIRECT LARYNGOSCOPY Left 10/09/2017   Procedure: DIRECT LARYNGOSCOPY WITH BOPSY;  Surgeon: Jodi Marble, MD;  Location: [facility];  Service: ENT;  Laterality: Left;   ESOPHAGOGASTRODUODENOSCOPY (EGD) WITH ESOPHAGEAL DILATION     "couple times" (07/19/2016)   ESOPHAGOSCOPY Left 10/09/2017   Procedure: ESOPHAGOSCOPY;  Surgeon: Jodi Marble, MD;  Location: [facility];  Service: ENT;  Laterality: Left;   INTRAVASCULAR IMAGING/OCT N/A 04/27/2020   Procedure: INTRAVASCULAR IMAGING/OCT;  Surgeon: Sherren Mocha, MD;  Location: [facility];  Service: Cardiovascular;  Laterality: N/A;   INTRAVASCULAR PRESSURE WIRE/FFR STUDY N/A 04/27/2020   Procedure: INTRAVASCULAR PRESSURE WIRE/FFR STUDY;  Surgeon: Sherren Mocha, MD;  Location: [facility];  Service: Cardiovascular;  Laterality: N/A;   IR GASTROSTOMY TUBE MOD SED  01/08/2018   IR THORACENTESIS ASP PLEURAL SPACE W/IMG GUIDE  07/19/2016   LAPAROSCOPIC CHOLECYSTECTOMY  1984   LUMBAR [place] SURGERY  05/1996   L4-5; Dr. Claudean Kinds LAMINECTOMY/DECOMPRESSION MICRODISCECTOMY  10/2002   L3-4.  Dr. Sherwood Gambler   MULTIPLE TOOTH EXTRACTIONS  1980s   PARTIAL GLOSSECTOMY  12/02/2017   Dr. Nicolette Bang- [facility]   pharyngoplasty for closure of tingue base defect  12/02/2017   Dr. Nicolette Bang- [facility]  07/15/2016   POSTERIOR LUMBAR FUSION  09/1996   Ray cage, L4-5 Dr. Rita Ohara   PROSTATE BIOPSY  ~ 2017   radical tonsillectomy Left 12/02/2017   Dr. Nicolette Bang at [facility] N/A 03/31/2020   Procedure: RIGHT HEART CATH AND CORONARY ANGIOGRAPHY;  Surgeon: Larey Dresser, MD;  Location: [facility];  Service: Cardiovascular;  Laterality: N/A;   RIGID BRONCHOSCOPY Left 10/09/2017   Procedure: RIGID BRONCHOSCOPY;  Surgeon: Jodi Marble, MD;  Location: [facility];  Service: ENT;  Laterality: Left;   TEE WITHOUT CARDIOVERSION N/A 06/07/2020   Procedure: TRANSESOPHAGEAL ECHOCARDIOGRAM (TEE);  Surgeon: Sherren Mocha, MD;  Location: [facility];  Service: Open Heart Surgery;  Laterality: N/A;   TONSILLECTOMY     TOTAL HIP ARTHROPLASTY Left 12/04/2020   Procedure: TOTAL HIP ARTHROPLASTY ANTERIOR APPROACH;  Surgeon: Rod Can, MD;  Location: [facility];  Service: Orthopedics;  Laterality: Left;   TOTAL HIP ARTHROPLASTY Left 11/2020   TRACHEOSTOMY  12/02/2017   Dr. Nicolette Bang- [facility]   ULTRASOUND GUIDANCE FOR VASCULAR ACCESS Right 06/07/2020   Procedure: ULTRASOUND GUIDANCE FOR  VASCULAR ACCESS;  Surgeon: Sherren Mocha, MD;  Location: [facility];  Service: Open Heart Surgery;  Laterality: Right;   ULTRASOUND GUIDANCE FOR VASCULAR ACCESS Bilateral 03/03/2021   Procedure: ULTRASOUND GUIDANCE FOR VASCULAR ACCESS, BILATERAL FEMORAL ARTERIES;  Surgeon: Waynetta Sandy, MD;  [facility];  Service: Vascular;  Laterality: Bilateral;   VASCULAR SURGERY     Family History  Problem Relation Age of Onset   Heart disease Father 9       Living   Coronary artery disease Father        CABG   Alzheimer's disease Mother 72       Deceased    Arthritis Mother    Aneurysm Brother    Stomach cancer Maternal Uncle    Brain cancer Maternal Aunt        x2   Obesity Daughter        Had Bypass Sx   Outpatient Medications Prior to Visit  Medication Sig Dispense Refill   acetaminophen (TYLENOL) 500 MG tablet Take 1,000 mg by mouth every 8 (eight) hours as needed for mild pain or headache.     allopurinol (ZYLOPRIM) 100 MG tablet TAKE 2 TABLETS(200 MG) BY MOUTH DAILY 180 tablet 0   aspirin EC 81 MG tablet Take 81 mg by mouth at bedtime. Swallow whole.     atorvastatin (LIPITOR) 80 MG tablet Take 1 tablet (80 mg total) by mouth daily. (Patient taking differently: Take 80 mg by mouth at bedtime.) 90 tablet 3   carvedilol (COREG) 12.5 MG tablet Take 1 tablet (12.5 mg total) by mouth 2 (two) times daily. 60 tablet 6   ezetimibe (ZETIA) 10 MG tablet Take 1 tablet (10 mg total) by mouth daily. 30 tablet 5   ferrous sulfate 325 (65 FE) MG EC tablet Take 1 tablet (325 mg total) by mouth daily with breakfast. 30 tablet 0   FLUoxetine (PROZAC) 40 MG capsule Take 1 capsule (40 mg total) by mouth daily. 30 capsule 5   fluticasone (FLONASE) 50 MCG/ACT nasal spray SHAKE LIQUID AND USE 2 SPRAYS IN EACH NOSTRIL DAILY AS NEEDED FOR RHINITIS OR ALLERGIES (Patient taking differently: Place 2 sprays into both nostrils daily.) 16 g 11   furosemide (LASIX) 20 MG tablet Take 1 tablet (20 mg total) by mouth daily. 90 tablet 3   gabapentin (NEURONTIN) 800 MG tablet Take 1 tablet (800 mg total) by mouth 3 (three) times daily. Can increase to 4x/day if needed after 3 days- for nerve pain (Patient taking differently: Take 800 mg by mouth 3 (three) times daily.) 120 tablet 5   guaiFENesin (MUCINEX) 600 MG 12 hr tablet Take 600 mg by mouth 2 (two) times daily as needed for cough or to loosen phlegm.     hydrOXYzine (ATARAX) 10 MG tablet Take 10-20 mg by mouth at bedtime as needed for itching (or sleep).     ipratropium (ATROVENT) 0.02 % nebulizer solution Use 1 vial  (0.5 mg total) by nebulization every 6 (six) hours as needed for wheezing or shortness of breath. 75 mL 12   ipratropium (ATROVENT) 0.03 % nasal spray Place 2 sprays into both nostrils every 12 (twelve) hours. 30 mL 12   levalbuterol (XOPENEX) 0.63 MG/3ML nebulizer solution Use 1 vial (0.63 mg total) by nebulization every 6 (six) hours as needed for wheezing or shortness of breath. 75 mL 12   morphine (MS CONTIN) 15 MG 12 hr tablet Take 1 tablet (15 mg total) by mouth every 12 (twelve) hours.  For chronic pain- severe DJD- do not fill until 03/13/22 60 tablet 0   Oxycodone HCl 10 MG TABS Take 1 tablet (10 mg total) by mouth every 6 (six) hours as needed (severe pain). Can be refilled as of 01/10/22- for chronic pain 150 tablet 0   pantoprazole (PROTONIX) 40 MG tablet Take 1 tablet (40 mg total) by mouth daily. (Patient taking differently: Take 40 mg by mouth daily as needed (for heartburn).) 30 tablet 2   potassium chloride (KLOR-CON) 10 MEQ tablet Take 10 mEq by mouth 4 (four) times daily.     predniSONE (DELTASONE) 1 MG tablet Take 3 tablets (3 mg total) by mouth daily with breakfast. Take in addition to 5 mg tablet to equal 8 mg. Taper by 1 mg every 2 months. 180 tablet 0   predniSONE (DELTASONE) 5 MG tablet TAKE 1 TABLET(5 MG) BY MOUTH DAILY WITH BREAKFAST 90 tablet 0   tamsulosin (FLOMAX) 0.4 MG CAPS capsule Take 0.4 mg by mouth See admin instructions. Take 0.4 mg by mouth one to two times a day     Tiotropium Bromide Monohydrate (SPIRIVA RESPIMAT) 2.5 MCG/ACT AERS Inhale 2 puffs into the lungs daily. 4 g 6   traMADol (ULTRAM) 50 MG tablet Take 2 tablets (100 mg total) by mouth every 8 (eight) hours as needed for moderate pain. 120 tablet 5   triamcinolone cream (KENALOG) 0.1 % Apply 1 Application topically 2 (two) times daily as needed (to itchy sites).     trolamine salicylate (ASPERCREME) 10 % cream Apply 1 Application topically 2 (two) times daily as needed (arthritis pain).     albuterol  (VENTOLIN HFA) 108 (90 Base) MCG/ACT inhaler INHALE 2 PUFFS INTO THE LUNGS EVERY 6 HOURS AS NEEDED FOR WHEEZING OR SHORTNESS OF BREATH 6.7 g 2   amLODipine (NORVASC) 10 MG tablet TAKE 1 TABLET(10 MG) BY MOUTH DAILY (Patient taking differently: Take 10 mg by mouth daily.) 90 tablet 3   naloxone (NARCAN) nasal spray 4 mg/0.1 mL For possible accidentally overdose. (Patient not taking: Reported on 03/09/2022) 2 each 5   No facility-administered medications prior to visit.   Allergies  Allergen Reactions   Tape Other (See Comments)    Tears skin. Prefers paper tape, Please   Celebrex [Celecoxib] Hives and Itching     Objective:   Today's Vitals   03/09/22 1013  BP: 122/60  Pulse: 79  Temp: (!) 96.9 F (36.1 C)  TempSrc: Temporal  SpO2: 93%  Weight: 158 lb (71.7 kg)  Height: 5' 8.5" (1.74 m)   Body mass index is 23.67 kg/m.   General: Well developed, well nourished. No acute distress. Lungs: Coarse respirations thorughout. No wheezing, rales or rhonchi. Psych: Alert and oriented. Normal mood and affect.  Health Maintenance Due  Topic Date Due   Zoster Vaccines- Shingrix (1 of 2) Never done   COLONOSCOPY (Pts 45-22yr Insurance coverage will need to be confirmed)  03/22/2020     Assessment & Plan:   Problem List Items Addressed This Visit       Cardiovascular and Mediastinum   Essential hypertension    Blood pressure shows a mildly low diastolic. This may be contributing to his orthostatic symptoms. I will reduce his amlodipine to 5 mg daily.      Relevant Medications   amLODipine (NORVASC) 5 MG tablet   Hypotension    Will try reducing his amlodipine dose.      Relevant Medications   amLODipine (NORVASC) 5 MG tablet  Respiratory   Chronic obstructive pulmonary disease ([place]) - Primary    COPD is stable. I will renew his albuterol. He should also continue his Spiriva inhaler. We did discuss him presenting with signs of acute exacerbation of his COPD so we can  hopefully head off a hospitalization.      Relevant Medications   albuterol (VENTOLIN HFA) 108 (90 Base) MCG/ACT inhaler    Return in about 3 months (around 06/07/2022) for Reassessment.   Haydee Salter, MD

## 2022-03-11 ENCOUNTER — Other Ambulatory Visit (HOSPITAL_COMMUNITY): Payer: Self-pay | Admitting: Cardiology

## 2022-03-11 ENCOUNTER — Other Ambulatory Visit: Payer: Self-pay | Admitting: Physical Medicine and Rehabilitation

## 2022-03-16 ENCOUNTER — Ambulatory Visit: Payer: Medicare Other | Admitting: Physical Medicine and Rehabilitation

## 2022-03-16 ENCOUNTER — Telehealth: Payer: Self-pay | Admitting: Physical Medicine and Rehabilitation

## 2022-03-16 MED ORDER — OXYCODONE HCL 10 MG PO TABS: 10.0000 mg | ORAL_TABLET | Freq: Four times a day (QID) | ORAL | 0 refills | Status: DC | PRN

## 2022-03-16 MED ORDER — MORPHINE SULFATE ER 15 MG PO TBCR: 15.0000 mg | EXTENDED_RELEASE_TABLET | Freq: Two times a day (BID) | ORAL | 0 refills | Status: DC

## 2022-03-16 NOTE — Telephone Encounter (Signed)
Patient came to office because he thought he had an appt. He did not. He is scheduled for 05/16/2022. He needs refills on Oxycodone and Morphine.

## 2022-04-03 ENCOUNTER — Ambulatory Visit (INDEPENDENT_AMBULATORY_CARE_PROVIDER_SITE_OTHER): Payer: Medicare Other | Admitting: Family Medicine

## 2022-04-03 ENCOUNTER — Encounter: Payer: Self-pay | Admitting: Family Medicine

## 2022-04-03 VITALS — BP 134/66 | HR 92 | Temp 97.9°F | Ht 68.5 in | Wt 153.2 lb

## 2022-04-03 DIAGNOSIS — J209 Acute bronchitis, unspecified: Secondary | ICD-10-CM

## 2022-04-03 DIAGNOSIS — L309 Dermatitis, unspecified: Secondary | ICD-10-CM | POA: Diagnosis not present

## 2022-04-03 DIAGNOSIS — J42 Unspecified chronic bronchitis: Secondary | ICD-10-CM

## 2022-04-03 MED ORDER — DOXYCYCLINE HYCLATE 100 MG PO TABS: 100.0000 mg | ORAL_TABLET | Freq: Two times a day (BID) | ORAL | 0 refills | Status: DC

## 2022-04-03 MED ORDER — HYDROXYZINE HCL 10 MG PO TABS: 10.0000 mg | ORAL_TABLET | Freq: Every evening | ORAL | 3 refills | Status: DC | PRN

## 2022-04-03 NOTE — Assessment & Plan Note (Signed)
Vitals are stable. I suspect this is more an exacerbation of his COPD, rather than a pneumonia at present. I will prescribe a course of doxycycline. He is already on prednisone. I will hold off on adding any additional steroid at present, but have him continue his inhalers.

## 2022-04-03 NOTE — Progress Notes (Signed)
[facility] [address] [place] [address] Dept: [phone] Dept Fax: [phone]  Office Visit  Subjective:    Patient ID: Brandon [name], male    DOB: 09-28-1940, 82 y.o..   MRN: [id number]  Chief Complaint  Patient presents with   Cough    C/o having cold chills/sweats, cough x 2 days.   Also having low back pain.    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 He is managed on albuterol and tiotropium. 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 He has been noting chills over the past 3-4 days. He has chronic sputum production and this has not necessarily changed color. He has had several admissions for pneumonia in the past year. He has had concerns about waiting too long to get ahead of his infections.  Brandon [name] notes a history of eczema. He notes when this flares, he gets itching with it. He has used hydroxyzine in the past, but is currently out of this.  Past Medical History: Patient Active Problem List   Diagnosis Date Noted   Chronically on opiate therapy 02/12/2022   Sacroiliac joint pain 02/12/2022   At risk for polypharmacy 01/19/2022   Severe sepsis ([place]) 01/18/2022   Aspiration pneumonia ([place]) 01/18/2022   Trochanteric bursitis of left hip 01/02/2022   Dysfunction of left eustachian tube 12/05/2021   Hypotension 08/18/2021   AKI (acute kidney injury) ([place]) 08/17/2021   Hyponatremia 06/26/2021   Hyperkalemia 0000000   Acute metabolic encephalopathy 0000000   Myoclonic jerking 06/26/2021   Lumbar radiculopathy 06/08/2021   Lumbar postlaminectomy syndrome 05/03/2021   Degeneration of lumbar intervertebral disc 05/03/2021   Lumbar spondylosis 05/03/2021   History of hip fracture 04/24/2021   S/P abdominal aortic aneurysm repair 04/24/2021   Chronic allergic rhinitis 01/24/2021   Demand ischemia    Acute respiratory failure with hypoxia (HCC)    Elevated  troponin    Chronic diastolic heart failure ([place]) 12/03/2020   Steatosis of liver 11/01/2020   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   Hepatitis B core antibody positive 11/01/2020   Benign prostatic hyperplasia 11/01/2020   Chronic obstructive pulmonary disease ([place]) 07/20/2020   History of placement of stent in LAD coronary artery 07/20/2020   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   TIA (transient ischemic attack)    Esophageal stricture    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   Primary osteoarthritis of both hands 01/11/2020   Piriformis syndrome of both sides 09/23/2019   Pain of both shoulder joints 07/29/2019   Sciatica associated with disorder of lumbar spine 04/24/2019   Primary osteoarthritis involving multiple joints 04/24/2019   Chronic pain syndrome 04/24/2019   Mixed anxiety and depressive disorder 04/15/2018   Oropharyngeal dysphagia 03/11/2018   Cancer of tonsillar fossa ([place]) 09/20/2017   Liver abscess 07/10/2016   Acute on chronic anemia 07/10/2016   Essential hypertension 05/19/2013   Psoriasis 01/13/2009   Coronary artery disease 09/15/2008   Diverticulosis of colon 08/27/2007   History of benign prostatic hyperplasia 08/27/2007   Hyperlipidemia 03/19/2007   Gastroesophageal reflux disease 03/19/2007   Past Surgical History:  Procedure Laterality Date   ABDOMINAL AORTIC ENDOVASCULAR STENT GRAFT N/A 03/03/2021   Procedure: ABDOMINAL AORTIC ENDOVASCULAR STENT GRAFT;  Surgeon: Waynetta Sandy, MD;  Location: [facility];  Service: Vascular;  Laterality: N/A;   ABDOMINAL AORTOGRAM W/LOWER EXTREMITY N/A  04/10/2021   Procedure: ABDOMINAL AORTOGRAM W/LOWER EXTREMITY;  Surgeon: Waynetta Sandy, MD;  Location: [facility];  Service: Cardiovascular;  Laterality: N/A;   BACK SURGERY     CARDIAC CATHETERIZATION     CATARACT EXTRACTION W/ INTRAOCULAR LENS  IMPLANT, BILATERAL Bilateral  01/2012 - 02/2012   CORONARY ATHERECTOMY N/A 04/27/2020   Procedure: CORONARY ATHERECTOMY;  Surgeon: Sherren Mocha, MD;  Location: [facility];  Service: Cardiovascular;  Laterality: N/A;   CORONARY STENT INTERVENTION N/A 04/27/2020   Procedure: CORONARY STENT INTERVENTION;  Surgeon: Sherren Mocha, MD;  Location: [facility];  Service: Cardiovascular;  Laterality: N/A;   DIRECT LARYNGOSCOPY Left 10/09/2017   Procedure: DIRECT LARYNGOSCOPY WITH BOPSY;  Surgeon: Jodi Marble, MD;  Location: [facility];  Service: ENT;  Laterality: Left;   ESOPHAGOGASTRODUODENOSCOPY (EGD) WITH ESOPHAGEAL DILATION     "couple times" (07/19/2016)   ESOPHAGOSCOPY Left 10/09/2017   Procedure: ESOPHAGOSCOPY;  Surgeon: Jodi Marble, MD;  Location: [facility];  Service: ENT;  Laterality: Left;   INTRAVASCULAR IMAGING/OCT N/A 04/27/2020   Procedure: INTRAVASCULAR IMAGING/OCT;  Surgeon: Sherren Mocha, MD;  Location: [facility];  Service: Cardiovascular;  Laterality: N/A;   INTRAVASCULAR PRESSURE WIRE/FFR STUDY N/A 04/27/2020   Procedure: INTRAVASCULAR PRESSURE WIRE/FFR STUDY;  Surgeon: Sherren Mocha, MD;  Location: [facility];  Service: Cardiovascular;  Laterality: N/A;   IR GASTROSTOMY TUBE MOD SED  01/08/2018   IR THORACENTESIS ASP PLEURAL SPACE W/IMG GUIDE  07/19/2016   LAPAROSCOPIC CHOLECYSTECTOMY  1984   LUMBAR [place] [facility]  05/1996   L4-5; Dr. Claudean Kinds LAMINECTOMY/DECOMPRESSION MICRODISCECTOMY  10/2002   L3-4. Dr. Sherwood Gambler   MULTIPLE TOOTH EXTRACTIONS  1980s   PARTIAL GLOSSECTOMY  12/02/2017   Dr. Nico[facility]- [facility]   pharyngoplasty for closure of tingue base defect  12/02/2017   Dr. Nicolette Bang- [facility]  07/15/2016   POSTERIOR LUMBAR FUSION  09/1996   Ray cage, L4-5 Dr. Rita Ohara   PROSTATE BIOPSY  ~ 2017   radical tonsillectomy Left 12/02/2017   Dr. Nicolette Bang at [facility] N/A  03/31/2020   Procedure: RIGHT HEART CATH AND CORONARY ANGIOGRAPHY;  Surgeon: Larey Dresser, MD;  Location: [facility];  Service: Cardiovascular;  Laterality: N/A;   RIGID BRONCHOSCOPY Left 10/09/2017   Procedure: RIGID BRONCHOSCOPY;  Surgeon: Jodi Marble, MD;  Location: [facility];  Service: ENT;  Laterality: Left;   TEE WITHOUT CARDIOVERSION N/A 06/07/2020   Procedure: TRANSESOPHAGEAL ECHOCARDIOGRAM (TEE);  Surgeon: Sherren Mocha, MD;  Location: [facility];  Service: Open Heart Surgery;  Laterality: N/A;   TONSILLECTOMY     TOTAL HIP ARTHROPLASTY Left 12/04/2020   Procedure: TOTAL HIP ARTHROPLASTY ANTERIOR APPROACH;  Surgeon: Rod Can, MD;  Location: [facility];  Service: Orthopedics;  Laterality: Left;   TOTAL HIP ARTHROPLASTY Left 11/2020   TRACHEOSTOMY  12/02/2017   Dr. Nicolette [facility]- [facility]   ULTRASOUND GUIDANCE FOR VASCULAR ACCESS Right 06/07/2020   Procedure: ULTRASOUND GUIDANCE FOR VASCULAR ACCESS;  Surgeon: Sherren Mocha, MD;  Location: [facility];  Service: Open Heart Surgery;  Laterality: Right;   ULTRASOUND GUIDANCE FOR VASCULAR ACCESS Bilateral 03/03/2021   Procedure: ULTRASOUND GUIDANCE FOR VASCULAR ACCESS, BILATERAL FEMORAL ARTERIES;  Surgeon: Waynetta Sandy, MD;  Location: [facility];  Service: Vascular;  Laterality: Bilateral;   VASCULAR SURGERY     Family History  Problem Relation Age of Onset  Heart disease Father 1       Living   Coronary artery disease Father        CABG   Alzheimer's disease Mother 27       Deceased   Arthritis Mother    Aneurysm Brother    Stomach cancer Maternal Uncle    Brain cancer Maternal Aunt        x2   Obesity Daughter        Had Bypass Sx   Outpatient Medications Prior to Visit  Medication Sig Dispense Refill   acetaminophen (TYLENOL) 500 MG tablet Take 1,000 mg by mouth every 8 (eight) hours as needed for mild pain or headache.     albuterol (VENTOLIN HFA) 108 (90 Base) MCG/ACT inhaler Inhale 2 puffs into  the lungs every 6 (six) hours as needed for wheezing or shortness of breath. 6.7 g 2   allopurinol (ZYLOPRIM) 100 MG tablet TAKE 2 TABLETS(200 MG) BY MOUTH DAILY 180 tablet 0   amLODipine (NORVASC) 5 MG tablet Take 1 tablet (5 mg total) by mouth daily. 90 tablet 3   aspirin EC 81 MG tablet Take 81 mg by mouth at bedtime. Swallow whole.     atorvastatin (LIPITOR) 80 MG tablet TAKE 1 TABLET(80 MG) BY MOUTH DAILY 90 tablet 3   citalopram (CELEXA) 20 MG tablet TAKE 1 TABLET(20 MG) BY MOUTH DAILY 90 tablet 1   ezetimibe (ZETIA) 10 MG tablet Take 1 tablet (10 mg total) by mouth daily. 30 tablet 5   FLUoxetine (PROZAC) 40 MG capsule Take 1 capsule (40 mg total) by mouth daily. 30 capsule 5   fluticasone (FLONASE) 50 MCG/ACT nasal spray SHAKE LIQUID AND USE 2 SPRAYS IN EACH NOSTRIL DAILY AS NEEDED FOR RHINITIS OR ALLERGIES (Patient taking differently: Place 2 sprays into both nostrils daily.) 16 g 11   gabapentin (NEURONTIN) 800 MG tablet Take 1 tablet (800 mg total) by mouth 3 (three) times daily. Can increase to 4x/day if needed after 3 days- for nerve pain (Patient taking differently: Take 800 mg by mouth 3 (three) times daily.) 120 tablet 5   guaiFENesin (MUCINEX) 600 MG 12 hr tablet Take 600 mg by mouth 2 (two) times daily as needed for cough or to loosen phlegm.     ipratropium (ATROVENT) 0.02 % nebulizer solution Use 1 vial (0.5 mg total) by nebulization every 6 (six) hours as needed for wheezing or shortness of breath. 75 mL 12   ipratropium (ATROVENT) 0.03 % nasal spray Place 2 sprays into both nostrils every 12 (twelve) hours. 30 mL 12   levalbuterol (XOPENEX) 0.63 MG/3ML nebulizer solution Use 1 vial (0.63 mg total) by nebulization every 6 (six) hours as needed for wheezing or shortness of breath. 75 mL 12   morphine (MS CONTIN) 15 MG 12 hr tablet Take 1 tablet (15 mg total) by mouth every 12 (twelve) hours. For chronic pain- severe DJD- do not fill until 03/13/22 60 tablet 0   naloxone (NARCAN)  nasal spray 4 mg/0.1 mL For possible accidentally overdose. 2 each 5   Oxycodone HCl 10 MG TABS Take 1 tablet (10 mg total) by mouth every 6 (six) hours as needed (severe pain). Can be refilled as of 01/10/22- for chronic pain 150 tablet 0   potassium chloride (KLOR-CON) 10 MEQ tablet Take 10 mEq by mouth 4 (four) times daily.     predniSONE (DELTASONE) 1 MG tablet Take 3 tablets (3 mg total) by mouth daily with breakfast. Take in addition to  5 mg tablet to equal 8 mg. Taper by 1 mg every 2 months. 180 tablet 0   predniSONE (DELTASONE) 5 MG tablet TAKE 1 TABLET(5 MG) BY MOUTH DAILY WITH BREAKFAST 90 tablet 0   tamsulosin (FLOMAX) 0.4 MG CAPS capsule Take 0.4 mg by mouth See admin instructions. Take 0.4 mg by mouth one to two times a day     Tiotropium Bromide Monohydrate (SPIRIVA RESPIMAT) 2.5 MCG/ACT AERS Inhale 2 puffs into the lungs daily. 4 g 6   traMADol (ULTRAM) 50 MG tablet Take 2 tablets (100 mg total) by mouth every 8 (eight) hours as needed for moderate pain. 120 tablet 5   triamcinolone cream (KENALOG) 0.1 % Apply 1 Application topically 2 (two) times daily as needed (to itchy sites).     trolamine salicylate (ASPERCREME) 10 % cream Apply 1 Application topically 2 (two) times daily as needed (arthritis pain).     hydrOXYzine (ATARAX) 10 MG tablet Take 10-20 mg by mouth at bedtime as needed for itching (or sleep).     carvedilol (COREG) 12.5 MG tablet Take 1 tablet (12.5 mg total) by mouth 2 (two) times daily. 60 tablet 6   ferrous sulfate 325 (65 FE) MG EC tablet Take 1 tablet (325 mg total) by mouth daily with breakfast. 30 tablet 0   furosemide (LASIX) 20 MG tablet Take 1 tablet (20 mg total) by mouth daily. 90 tablet 3   pantoprazole (PROTONIX) 40 MG tablet Take 1 tablet (40 mg total) by mouth daily. (Patient taking differently: Take 40 mg by mouth daily as needed (for heartburn).) 30 tablet 2   No facility-administered medications prior to visit.   Allergies  Allergen Reactions    Tape Other (See Comments)    Tears skin. Prefers paper tape, Please   Celebrex [Celecoxib] Hives and Itching     Objective:   Today's Vitals   04/03/22 1254  BP: 134/66  Pulse: 92  Temp: 97.9 F (36.6 C)  TempSrc: Temporal  SpO2: 93%  Weight: 153 lb 3.2 oz (69.5 kg)  Height: 5' 8.5" (1.74 m)   Body mass index is 22.96 kg/m.   General: Well developed, well nourished. No acute distress. Lungs: There are scattered wheezes throughout the chest. The right chest has some rhonchi as well. Psych: Alert and oriented. Normal mood and affect.  Health Maintenance Due  Topic Date Due   Zoster Vaccines- Shingrix (1 of 2) Never done   COLONOSCOPY (Pts 45-34yr Insurance coverage will need to be confirmed)  03/22/2020     Assessment & Plan:   Problem List Items Addressed This Visit       Respiratory   Acute exacerbation of chronic bronchitis ([place] [facility]    Vitals are stable. I suspect this is more an exacerbation of his COPD, rather than a pneumonia at present. I will prescribe a course of doxycycline. He is already on prednisone. I will hold off on adding any additional steroid at present, but have him continue his inhalers.      Relevant Medications   doxycycline (VIBRA-TABS) 100 MG tablet   Other Visit Diagnoses     Eczema, unspecified type       I will renew his hydroxyzine for his itching.   Relevant Medications   hydrOXYzine (ATARAX) 10 MG tablet       No follow-ups on file.   SHaydee Salter MD

## 2022-04-05 ENCOUNTER — Other Ambulatory Visit (HOSPITAL_COMMUNITY): Payer: Self-pay

## 2022-04-05 MED ORDER — CARVEDILOL 12.5 MG PO TABS: 12.5000 mg | ORAL_TABLET | Freq: Two times a day (BID) | ORAL | 6 refills | Status: DC

## 2022-04-19 ENCOUNTER — Other Ambulatory Visit: Payer: Self-pay | Admitting: Physician Assistant

## 2022-04-19 NOTE — Telephone Encounter (Signed)
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 Uric acid is within the desirable range.Glucose is elevated-139.  Potassium is elevated-5.5. Alk phos is elevated.   Okay to refill Allopurinol?

## 2022-04-23 ENCOUNTER — Telehealth: Payer: Self-pay

## 2022-04-23 MED ORDER — OXYCODONE HCL 10 MG PO TABS: 10.0000 mg | ORAL_TABLET | Freq: Four times a day (QID) | ORAL | 0 refills | Status: DC | PRN

## 2022-04-23 MED ORDER — MORPHINE SULFATE ER 15 MG PO TBCR: 15.0000 mg | EXTENDED_RELEASE_TABLET | Freq: Two times a day (BID) | ORAL | 0 refills | Status: DC

## 2022-04-23 NOTE — Telephone Encounter (Signed)
Patient requesting refills on morphine and oxycodone last fill per pmp  Filled  Written  ID  Drug  QTY  Days  Prescriber  RX #  Dispenser  Refill  Daily Dose*  Pymt Type  PMP  03/22/2022 03/16/2022 1  Oxycodone Hcl (Ir) 10 Mg Tab 150.00 37 Me Lov [id number] [facility] ([id number]) 0/0 60.81 MME Medicare [place] 03/16/2022 03/16/2022 1  Morphine Sulf Er 15 Mg Tablet 60.00 30 Me Lov [id number] [facility] ([id number]) 0/0 30.00 MME Medicare [place]

## 2022-04-26 ENCOUNTER — Other Ambulatory Visit: Payer: Self-pay | Admitting: Physician Assistant

## 2022-04-26 MED ORDER — PREDNISONE 1 MG PO TABS: 3.0000 mg | ORAL_TABLET | Freq: Every day | ORAL | 0 refills | Status: DC

## 2022-04-26 NOTE — Telephone Encounter (Signed)
Ok to continue on prednisone 8 mg daily

## 2022-04-26 NOTE — Telephone Encounter (Signed)
Last Fill: 02/20/2022  Next Visit: 05/23/2022  Last Visit: 02/19/2022  Dx: Polymyalgia rheumatica   Current Dose per office note on 02/19/2022: Continue on 8 mg and taper by 1 mg every 2 months.   Patient states he is still taking Prednisone 8 mg. Patient states he has been unable to taper. Patient states he is having trouble with pain and swelling in his hands. He states that he can not make a fist some days.   Okay to refill Prednisone?

## 2022-04-30 ENCOUNTER — Ambulatory Visit (INDEPENDENT_AMBULATORY_CARE_PROVIDER_SITE_OTHER): Payer: Medicare Other | Admitting: Nurse Practitioner

## 2022-04-30 ENCOUNTER — Encounter: Payer: Self-pay | Admitting: Nurse Practitioner

## 2022-04-30 VITALS — BP 116/64 | HR 74 | Temp 98.8°F | Ht 68.5 in | Wt 156.6 lb

## 2022-04-30 DIAGNOSIS — M1A09X0 Idiopathic chronic gout, multiple sites, without tophus (tophi): Secondary | ICD-10-CM | POA: Diagnosis not present

## 2022-04-30 NOTE — Patient Instructions (Signed)
It was great to see you!  It sounds like your symptoms are consistent with gout. Keep taking the allopurinol twice a day.   Let's follow-up if your symptoms worsen or don't improve.   Take care,  Vance Peper, NP

## 2022-04-30 NOTE — Progress Notes (Unsigned)
   Acute Office Visit  Subjective:     Patient ID: Brandon [name], male    DOB: 1940/06/21, 82 y.o.   MRN: [id number]  Chief Complaint  Patient presents with   Hand Pain    Left Hand Pain with swelling started on Friday    HPI Patient is in today for left hand pain and swelling that started suddenly on Friday. He states that he has a history of gout in his right elbow and this felt similar. He denies injury. He states his hand was red and warm as well. Minimal touching caused severe pain. He states the medication he takes daily for pain, MS contin and prednisone, did not help the pain in his hand. He normally takes allopurinol 200mg  BID and he states that he increased this to 300mg  BID for 3 days. Today, his pain and swelling is doing a lot better.   ROS See pertinent positives and negatives per HPI.     Objective:    BP 116/64 (BP Location: Right Arm)   Pulse 74   Temp 98.8 F (37.1 C)   Ht 5' 8.5" (1.74 m)   Wt 156 lb 9.6 oz (71 kg)   SpO2 93%   BMI 23.46 kg/m    Physical Exam Vitals and nursing note reviewed.  Constitutional:      Appearance: Normal appearance.  HENT:     Head: Normocephalic.  Eyes:     Conjunctiva/sclera: Conjunctivae normal.  Pulmonary:     Effort: Pulmonary effort is normal.  Musculoskeletal:        General: Swelling (left hand) present. No tenderness.     Cervical back: Normal range of motion.  Skin:    General: Skin is warm.  Neurological:     General: No focal deficit present.     Mental Status: He is alert and oriented to person, place, and time.  Psychiatric:        Mood and Affect: Mood normal.        Behavior: Behavior normal.        Thought Content: Thought content normal.        Judgment: Judgment normal.       Assessment & Plan:   Problem List Items Addressed This Visit       Musculoskeletal and Integument   Chronic gout of multiple sites - Primary    Symptoms consistent with gout flare in left hand, now improving. He  takes prednisone 8mg  daily for arthritis, continue this. He can use ice as needed for swelling. Keep taking allopurinol 200mg  BID. Follow-up if symptoms worsen or don't improve.        No orders of the defined types were placed in this encounter.   Return if symptoms worsen or fail to improve.  Charyl Dancer, NP

## 2022-05-01 NOTE — Assessment & Plan Note (Signed)
Symptoms consistent with gout flare in left hand, now improving. He takes prednisone 8mg  daily for arthritis, continue this. He can use ice as needed for swelling. Keep taking allopurinol 200mg  BID. Follow-up if symptoms worsen or don't improve.

## 2022-05-02 ENCOUNTER — Ambulatory Visit: Payer: Medicare Other | Admitting: Physician Assistant

## 2022-05-02 ENCOUNTER — Ambulatory Visit (HOSPITAL_COMMUNITY)
Admission: RE | Admit: 2022-05-02 | Discharge: 2022-05-02 | Disposition: A | Discharge status: Home / Self Care | Payer: Medicare Other | Source: Ambulatory Visit | Attending: Vascular Surgery | Admitting: Vascular Surgery

## 2022-05-02 VITALS — BP 149/64 | HR 67 | Temp 97.7°F | Ht 68.0 in | Wt 158.0 lb

## 2022-05-02 DIAGNOSIS — I714 Abdominal aortic aneurysm, without rupture, unspecified: Secondary | ICD-10-CM

## 2022-05-02 DIAGNOSIS — Z9889 Other specified postprocedural states: Secondary | ICD-10-CM

## 2022-05-02 NOTE — Progress Notes (Addendum)
Office Note     CC:  follow up Requesting Provider:  Haydee Salter, MD  HPI: Brandon [name] is a 82 y.o. ([birth year]) male who presents for surveillance of endovascular repair of abdominal aortic aneurysm.  This was performed by Dr. Donzetta Matters in January 2023.  He required restenting of both limbs in March 2023.  He has chronic back pain for which he gets his spine injections by [facility].  He denies any new or changing back pain.  He also denies any abdominal pain.  He is ambulatory without claudication.  He does not have any tissue changes of bilateral lower extremities.  He is on aspirin and statin daily.    Past Medical History:  Diagnosis Date   Abdominal aneurysm ([place])    Arthritis    "all over" (07/19/2016)   BPH (benign prostatic hypertrophy)    CAD (coronary artery disease)    Chicken pox    Chronic lower back pain    s/p surgical fusion   Depression    Diverticulosis    Esophageal stricture    GERD (gastroesophageal reflux disease)    Hepatitis B 1984   Hiatal hernia    History of radiation therapy 01/16/18- 03/05/18   Left Tonsil, 66 Gy in 33 fractions to high risk nodal echelons.    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 Burt Knack and Dr Cyndia Bent    Severe aortic stenosis    TIA (transient ischemic attack) 1990s   hx   tonsillar ca dx'd 11/2017   Tubular adenoma of colon 2009    Past Surgical History:  Procedure Laterality Date   ABDOMINAL AORTIC ENDOVASCULAR STENT GRAFT N/A 03/03/2021   Procedure: ABDOMINAL AORTIC ENDOVASCULAR STENT GRAFT;  Surgeon: Waynetta Sandy, MD;  Location: [place];  Service: Vascular;  Laterality: N/A;   ABDOMINAL AORTOGRAM W/LOWER EXTREMITY N/A 04/10/2021   Procedure: ABDOMINAL AORTOGRAM W/LOWER EXTREMITY;  Surgeon: Waynetta Sandy, MD;   Location: [place] [facility];  Service: Cardiovascular;  Laterality: N/A;   BACK SURGERY     CARDIAC CATHETERIZATION     CATARACT EXTRACTION W/ INTRAOCULAR LENS  IMPLANT, BILATERAL Bilateral 01/2012 - 02/2012   CORONARY ATHERECTOMY N/A 04/27/2020   Procedure: CORONARY ATHERECTOMY;  Surgeon: Sherren Mocha, MD;  Location: [place] [facility];  Service: Cardiovascular;  Laterality: N/A;   CORONARY STENT INTERVENTION N/A 04/27/2020   Procedure: CORONARY STENT INTERVENTION;  Surgeon: Sherren Mocha, MD;  Location: [place] [facility];  Service: Cardiovascular;  Laterality: N/A;   DIRECT LARYNGOSCOPY Left 10/09/2017   Procedure: DIRECT LARYNGOSCOPY WITH BOPSY;  Surgeon: Jodi Marble, MD;  Location: [place];  Service: ENT;  Laterality: Left;   ESOPHAGOGASTRODUODENOSCOPY (EGD) WITH ESOPHAGEAL DILATION     "couple times" (07/19/2016)   ESOPHAGOSCOPY Left 10/09/2017   Procedure: ESOPHAGOSCOPY;  Surgeon: Jodi Marble, MD;  Location: [place];  Service: ENT;  Laterality: Left;   INTRAVASCULAR IMAGING/OCT N/A 04/27/2020   Procedure: INTRAVASCULAR IMAGING/OCT;  Surgeon: Sherren Mocha, MD;  Location: [place] [facility];  Service: Cardiovascular;  Laterality: N/A;   INTRAVASCULAR PRESSURE WIRE/FFR STUDY N/A 04/27/2020   Procedure: INTRAVASCULAR PRESSURE WIRE/FFR STUDY;  Surgeon: Sherren Mocha, MD;  Location: [place] [facility];  Service: Cardiovascular;  Laterality: N/A;   IR GASTROSTOMY TUBE MOD  SED  01/08/2018   IR THORACENTESIS ASP PLEURAL SPACE W/IMG GUIDE  07/19/2016   LAPAROSCOPIC CHOLECYSTECTOMY  1984   LUMBAR DISC SURGERY  05/1996   L4-5; Dr. Claudean Kinds LAMINECTOMY/DECOMPRESSION MICRODISCECTOMY  10/2002   L3-4. Dr. Sherwood Gambler   MULTIPLE TOOTH EXTRACTIONS  1980s   PARTIAL GLOSSECTOMY  12/02/2017   Dr. Nicolette Bang- [facility]   pharyngoplasty for closure of tingue base defect  12/02/2017   Dr. Nicolette Bang- [place]  07/15/2016   POSTERIOR  LUMBAR FUSION  09/1996   Ray cage, L4-5 Dr. Rita Ohara   PROSTATE BIOPSY  ~ 2017   radical tonsillectomy Left 12/02/2017   Dr. Nicolette Bang at [place] N/A 03/31/2020   Procedure: RIGHT HEART CATH AND CORONARY ANGIOGRAPHY;  Surgeon: Larey Dresser, MD;  Location: [place] [facility];  Service: Cardiovascular;  Laterality: N/A;   RIGID BRONCHOSCOPY Left 10/09/2017   Procedure: RIGID BRONCHOSCOPY;  Surgeon: Jodi Marble, MD;  Location: [place];  Service: ENT;  Laterality: Left;   TEE WITHOUT CARDIOVERSION N/A 06/07/2020   Procedure: TRANSESOPHAGEAL ECHOCARDIOGRAM (TEE);  Surgeon: Sherren Mocha, MD;  Location: [place];  Service: Open Heart Surgery;  Laterality: N/A;   TONSILLECTOMY     TOTAL HIP ARTHROPLASTY Left 12/04/2020   Procedure: TOTAL HIP ARTHROPLASTY ANTERIOR APPROACH;  Surgeon: Rod Can, MD;  Location: [facility];  Service: Orthopedics;  Laterality: Left;   TOTAL HIP ARTHROPLASTY Left 11/2020   TRACHEOSTOMY  12/02/2017   Dr. Nicolette Bang- [facility]   ULTRASOUND GUIDANCE FOR VASCULAR ACCESS Right 06/07/2020   Procedure: ULTRASOUND GUIDANCE FOR VASCULAR ACCESS;  Surgeon: Sherren Mocha, MD;  Location: [place];  Service: Open Heart Surgery;  Laterality: Right;   ULTRASOUND GUIDANCE FOR VASCULAR ACCESS Bilateral 03/03/2021   Procedure: ULTRASOUND GUIDANCE FOR VASCULAR ACCESS, BILATERAL FEMORAL ARTERIES;  Surgeon: Waynetta Sandy, MD;  Location: [facility];  Service: Vascular;  Laterality: Bilateral;   VASCULAR SURGERY      Social History   Socioeconomic History   Marital status: Married    Spouse name: etta   Number of children: 2   Years of education: Not on file   Highest education level: Not on file  Occupational History   Occupation: retired    Fish farm manager: RETIRED    Comment: disabled due to back problems  Tobacco Use   Smoking status: Former    Packs/day: 1.50    Years: 50.00    Additional pack years: 0.00    Total  pack years: 75.00    Types: Cigarettes    Quit date: 09/05/2017    Years since quitting: 4.6    Passive exposure: Never   Smokeless tobacco: Never   Tobacco comments:    smoked less than 1 ppd for 40+ years;   Vaping Use   Vaping Use: Never used  Substance and Sexual Activity   Alcohol use: Not Currently   Drug use: Yes    Types: Oxycodone    Comment: has prescription   Sexual activity: Not Currently  Other Topics Concern   Not on file  Social History Narrative   ** Merged History Encounter **       Married (35rd), [place]. 2 children from 1st marriage, 4 step children.    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       Cell # [phone]  Social Determinants of Health   Financial Resource Strain: Low Risk  (06/07/2021)   Overall Financial Resource Strain (CARDIA)    Difficulty of Paying Living Expenses: Not hard at all  Food Insecurity: No Food Insecurity (01/18/2022)   Hunger Vital Sign    Worried About Running Out of Food in the Last Year: Never true    Ran Out of Food in the Last Year: Never true  Transportation Needs: No Transportation Needs (01/23/2022)   PRAPARE - Hydrologist (Medical): No    Lack of Transportation (Non-Medical): No  Physical Activity: Inactive (06/07/2021)   Exercise Vital Sign    Days of Exercise per Week: 0 days    Minutes of Exercise per Session: 0 min  Stress: No Stress Concern Present (06/07/2021)   [place]    Feeling of Stress : Not at all  Social Connections: Moderately Integrated (06/07/2021)   Social Connection and Isolation Panel [NHANES]    Frequency of Communication with Friends and Family: Twice a week    Frequency of Social Gatherings with Friends and Family: Twice a week    Attends Religious Services: More than 4 times per year    Active Member of [employer] or  Organizations: No    Attends Archivist Meetings: Never    Marital Status: Married  Human resources officer Violence: Not At Risk (01/18/2022)   Humiliation, Afraid, Rape, and Kick questionnaire    Fear of Current or Ex-Partner: No    Emotionally Abused: No    Physically Abused: No    Sexually Abused: No    Family History  Problem Relation Age of Onset   Heart disease Father 58       Living   Coronary artery disease Father        CABG   Alzheimer's disease Mother 49       Deceased   Arthritis Mother    Aneurysm Brother    Stomach cancer Maternal Uncle    Brain cancer Maternal Aunt        x2   Obesity Daughter        Had Bypass Sx    Current Outpatient Medications  Medication Sig Dispense Refill   acetaminophen (TYLENOL) 500 MG tablet Take 1,000 mg by mouth every 8 (eight) hours as needed for mild pain or headache.     albuterol (VENTOLIN HFA) 108 (90 Base) MCG/ACT inhaler Inhale 2 puffs into the lungs every 6 (six) hours as needed for wheezing or shortness of breath. 6.7 g 2   allopurinol (ZYLOPRIM) 100 MG tablet TAKE 2 TABLETS(200 MG) BY MOUTH DAILY 180 tablet 0   amLODipine (NORVASC) 5 MG tablet Take 1 tablet (5 mg total) by mouth daily. 90 tablet 3   aspirin EC 81 MG tablet Take 81 mg by mouth at bedtime. Swallow whole.     atorvastatin (LIPITOR) 80 MG tablet TAKE 1 TABLET(80 MG) BY MOUTH DAILY 90 tablet 3   carvedilol (COREG) 12.5 MG tablet Take 1 tablet (12.5 mg total) by mouth 2 (two) times daily. 60 tablet 6   citalopram (CELEXA) 20 MG tablet TAKE 1 TABLET(20 MG) BY MOUTH DAILY 90 tablet 1   ezetimibe (ZETIA) 10 MG tablet Take 1 tablet (10 mg total) by mouth daily. 30 tablet 5   FLUoxetine (PROZAC) 40 MG capsule Take 1 capsule (40 mg total) by mouth daily. 30 capsule 5   fluticasone (FLONASE) 50 MCG/ACT nasal spray SHAKE  LIQUID AND USE 2 SPRAYS IN EACH NOSTRIL DAILY AS NEEDED FOR RHINITIS OR ALLERGIES (Patient taking differently: Place 2 sprays into both nostrils  daily.) 16 g 11   gabapentin (NEURONTIN) 800 MG tablet Take 1 tablet (800 mg total) by mouth 3 (three) times daily. Can increase to 4x/day if needed after 3 days- for nerve pain (Patient taking differently: Take 800 mg by mouth 3 (three) times daily.) 120 tablet 5   guaiFENesin (MUCINEX) 600 MG 12 hr tablet Take 600 mg by mouth 2 (two) times daily as needed for cough or to loosen phlegm.     hydrOXYzine (ATARAX) 10 MG tablet Take 1-2 tablets (10-20 mg total) by mouth at bedtime as needed for itching (or sleep). 30 tablet 3   ipratropium (ATROVENT) 0.02 % nebulizer solution Use 1 vial (0.5 mg total) by nebulization every 6 (six) hours as needed for wheezing or shortness of breath. 75 mL 12   ipratropium (ATROVENT) 0.03 % nasal spray Place 2 sprays into both nostrils every 12 (twelve) hours. 30 mL 12   levalbuterol (XOPENEX) 0.63 MG/3ML nebulizer solution Use 1 vial (0.63 mg total) by nebulization every 6 (six) hours as needed for wheezing or shortness of breath. 75 mL 12   morphine (MS CONTIN) 15 MG 12 hr tablet Take 1 tablet (15 mg total) by mouth every 12 (twelve) hours. For chronic pain- severe DJD- 60 tablet 0   naloxone (NARCAN) nasal spray 4 mg/0.1 mL For possible accidentally overdose. 2 each 5   Oxycodone HCl 10 MG TABS Take 1 tablet (10 mg total) by mouth every 6 (six) hours as needed (severe pain). for chronic pain 150 tablet 0   potassium chloride (KLOR-CON) 10 MEQ tablet Take 10 mEq by mouth 4 (four) times daily.     predniSONE (DELTASONE) 1 MG tablet Take 3 tablets (3 mg total) by mouth daily with breakfast. Take in addition to 5 mg tablet to equal 8 mg. Taper by 1 mg every 2 months. 180 tablet 0   predniSONE (DELTASONE) 5 MG tablet TAKE 1 TABLET(5 MG) BY MOUTH DAILY WITH BREAKFAST 90 tablet 0   tamsulosin (FLOMAX) 0.4 MG CAPS capsule Take 0.4 mg by mouth See admin instructions. Take 0.4 mg by mouth one to two times a day     Tiotropium Bromide Monohydrate (SPIRIVA RESPIMAT) 2.5 MCG/ACT  AERS Inhale 2 puffs into the lungs daily. 4 g 6   traMADol (ULTRAM) 50 MG tablet Take 2 tablets (100 mg total) by mouth every 8 (eight) hours as needed for moderate pain. 120 tablet 5   triamcinolone cream (KENALOG) 0.1 % Apply 1 Application topically 2 (two) times daily as needed (to itchy sites).     trolamine salicylate (ASPERCREME) 10 % cream Apply 1 Application topically 2 (two) times daily as needed (arthritis pain).     ferrous sulfate 325 (65 FE) MG EC tablet Take 1 tablet (325 mg total) by mouth daily with breakfast. 30 tablet 0   furosemide (LASIX) 20 MG tablet Take 1 tablet (20 mg total) by mouth daily. 90 tablet 3   pantoprazole (PROTONIX) 40 MG tablet Take 1 tablet (40 mg total) by mouth daily. (Patient taking differently: Take 40 mg by mouth daily as needed (for heartburn).) 30 tablet 2   No current facility-administered medications for this visit.    Allergies  Allergen Reactions   Tape Other (See Comments)    Tears skin. Prefers paper tape, Please   Celebrex [Celecoxib] Hives and Itching  REVIEW OF SYSTEMS:   [X]  denotes positive finding, [ ]  denotes negative finding Cardiac  Comments:  Chest pain or chest pressure:    Shortness of breath upon exertion:    Short of breath when lying flat:    Irregular heart rhythm:        Vascular    Pain in calf, thigh, or hip brought on by ambulation:    Pain in feet at night that wakes you up from your sleep:     Blood clot in your veins:    Leg swelling:         Pulmonary    Oxygen at home:    Productive cough:     Wheezing:         Neurologic    Sudden weakness in arms or legs:     Sudden numbness in arms or legs:     Sudden onset of difficulty speaking or slurred speech:    Temporary loss of vision in one eye:     Problems with dizziness:         Gastrointestinal    Blood in stool:     Vomited blood:         Genitourinary    Burning when urinating:     Blood in urine:        Psychiatric    Major  depression:         Hematologic    Bleeding problems:    Problems with blood clotting too easily:        Skin    Rashes or ulcers:        Constitutional    Fever or chills:      PHYSICAL EXAMINATION:  Vitals:   05/02/22 0824  BP: (!) 149/64  Pulse: 67  Temp: 97.7 F (36.5 C)  TempSrc: Temporal  SpO2: 96%  Weight: 158 lb (71.7 kg)  Height: 5\' 8"  (1.727 m)    General:  WDWN in NAD; vital signs documented above Gait: Not observed HENT: WNL, normocephalic Pulmonary: normal non-labored breathing Cardiac: regular HR Abdomen: soft, NT, no masses Skin: without rashes Vascular Exam/Pulses:  Right Left  DP 1+ (weak) 1+ (weak)  PT 2+ (normal) 2+ (normal)   Extremities: without ischemic changes, without Gangrene , without cellulitis; without open wounds;  Musculoskeletal: no muscle wasting or atrophy  Neurologic: A&O X 3;  No focal weakness or paresthesias are detected Psychiatric:  The pt has Normal affect.   Non-Invasive Vascular Imaging:   AAA sac measuring 5.04 cm in largest diameter Patent limbs bilaterally No endoleak visualized    ASSESSMENT/PLAN:: 82 y.o. male here for follow up for surveillance of endovascular repair of abdominal aortic aneurysm  -Bilateral lower extremities are well-perfused with palpable pedal pulses -EVAR duplex demonstrates a stable AAA residual sac measuring 5.04 cm at largest diameter.  No endoleak's were visualized.  Both limbs were widely patent. -Continue aspirin and statin daily -Recheck EVAR duplex in 1 year per protocol   Dagoberto Ligas, PA-C [facility] [phone]  Clinic MD: Scot Dock

## 2022-05-09 NOTE — Progress Notes (Signed)
Office Visit Note  Patient: Brandon [name]             Date of Birth: 1940/09/28           MRN: [id number]             PCP: Brandon Mast, MD Referring: Brandon Mast, MD Visit Date: 05/23/2022 Occupation: @GUAROCC @  Subjective:  Pain in both hands   History of Present Illness: Brandon [name] is a 82 y.o. male with history of polymyalgia rheumatica, osteoarthritis, DDD, and gout.  Patient remains on prednisone 8 mg daily.  Patient reports that he has not noticed a huge difference in his symptoms since going from prednisone 5 mg to 8 mg after his last office visit in January 2024.  He continues to have soreness and stiffness in both hands on a daily basis.  Patient reports he had a gout flare in the left hand 3 weeks ago which has since resolved.  He has been eating shrimp and sausage more frequently. He remains on allopurinol 200 mg daily.   He remains under the care of Brandon [name] for pain management.  He takes oxycodone for pain relief and tylenol PRN.  He denies any signs or symptoms of a PMR flare.  He had a lower back injection today.    Activities of Daily Living:  Patient reports joint stiffness all day  Patient Reports nocturnal pain.  Difficulty dressing/grooming: Denies Difficulty climbing stairs: Denies Difficulty getting out of chair: Denies Difficulty using hands for taps, buttons, cutlery, and/or writing: Denies  Review of Systems  Constitutional:  Positive for fatigue.  HENT:  Positive for mouth dryness. Negative for mouth sores.   Eyes:  Negative for dryness.  Respiratory:  Negative for shortness of breath.   Cardiovascular:  Negative for chest pain and palpitations.  Gastrointestinal:  Negative for blood in stool, constipation and diarrhea.  Endocrine: Negative for increased urination.  Genitourinary:  Negative for involuntary urination.  Musculoskeletal:  Positive for joint pain, joint pain, joint swelling, myalgias, muscle weakness, morning stiffness, muscle  tenderness and myalgias. Negative for gait problem.  Skin:  Negative for color change, rash, hair loss and sensitivity to sunlight.  Allergic/Immunologic: Negative for susceptible to infections.  Neurological:  Positive for dizziness. Negative for headaches.  Hematological:  Positive for bruising/bleeding tendency. Negative for swollen glands.  Psychiatric/Behavioral:  Negative for depressed mood and sleep disturbance. The patient is not nervous/anxious.     PMFS History:  Patient Active Problem List   Diagnosis Date Noted   Chronic gout of multiple sites 04/30/2022   Acute exacerbation of chronic bronchitis 04/03/2022   Chronically on opiate therapy 02/12/2022   Sacroiliac joint pain 02/12/2022   At risk for polypharmacy 01/19/2022   Severe sepsis 01/18/2022   Aspiration pneumonia 01/18/2022   Trochanteric bursitis of left hip 01/02/2022   Dysfunction of left eustachian tube 12/05/2021   Hypotension 08/18/2021   AKI (acute kidney injury) 08/17/2021   Hyponatremia 06/26/2021   Hyperkalemia 06/26/2021   Acute metabolic encephalopathy 06/26/2021   Myoclonic jerking 06/26/2021   Lumbar radiculopathy 06/08/2021   Lumbar postlaminectomy syndrome 05/03/2021   Degeneration of lumbar intervertebral disc 05/03/2021   Lumbar spondylosis 05/03/2021   History of hip fracture 04/24/2021   S/P abdominal aortic aneurysm repair 04/24/2021   Chronic allergic rhinitis 01/24/2021   Demand ischemia    Acute respiratory failure with hypoxia    Elevated troponin    Chronic diastolic heart failure 12/03/2020  Steatosis of liver 11/01/2020   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   Hepatitis B core antibody positive 11/01/2020   Benign prostatic hyperplasia 11/01/2020   Chronic obstructive pulmonary disease 07/20/2020   History of placement of stent in LAD coronary artery 07/20/2020   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   TIA (transient ischemic attack)    Esophageal  stricture    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   Primary osteoarthritis of both hands 01/11/2020   Piriformis syndrome of both sides 09/23/2019   Pain of both shoulder joints 07/29/2019   Sciatica associated with disorder of lumbar spine 04/24/2019   Primary osteoarthritis involving multiple joints 04/24/2019   Chronic pain syndrome 04/24/2019   Mixed anxiety and depressive disorder 04/15/2018   Oropharyngeal dysphagia 03/11/2018   Cancer of tonsillar fossa 09/20/2017   Liver abscess 07/10/2016   Acute on chronic anemia 07/10/2016   Essential hypertension 05/19/2013   Psoriasis 01/13/2009   Coronary artery disease 09/15/2008   Diverticulosis of colon 08/27/2007   History of benign prostatic hyperplasia 08/27/2007   Hyperlipidemia 03/19/2007   Gastroesophageal reflux disease 03/19/2007    Past Medical History:  Diagnosis Date   Abdominal aneurysm    Arthritis    "all over" (07/19/2016)   BPH (benign prostatic hypertrophy)    CAD (coronary artery disease)    Chicken pox    Chronic lower back pain    s/p surgical fusion   Depression    Diverticulosis    Esophageal stricture    GERD (gastroesophageal reflux disease)    Hepatitis B 1984   Hiatal hernia    History of radiation therapy 01/16/18- 03/05/18   Left Tonsil, 66 Gy in 33 fractions to high risk nodal echelons.    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   s/p TAVR with a 29 mm Edwards Sapien 3 via the subclavian approach by Brandon [name] and Brandon [name]    Severe aortic stenosis    TIA (transient ischemic attack) 1990s   hx   tonsillar ca dx'd 11/2017   Tubular adenoma of colon 2009    Family History  Problem Relation Age of Onset   Heart disease Father 12       Living   Coronary artery disease Father        CABG   Alzheimer's disease Mother 71       Deceased    Arthritis Mother    Aneurysm Brother    Stomach cancer Maternal Uncle    Brain cancer Maternal Aunt        x2   Obesity Daughter        Had Bypass Sx   Past Surgical History:  Procedure Laterality Date   ABDOMINAL AORTIC ENDOVASCULAR STENT GRAFT N/A 03/03/2021   Procedure: ABDOMINAL AORTIC ENDOVASCULAR STENT GRAFT;  Surgeon: Maeola Harman, MD;  Location: [facility] OR;  Service: Vascular;  Laterality: N/A;   ABDOMINAL AORTOGRAM W/LOWER EXTREMITY N/A 04/10/2021   Procedure: ABDOMINAL AORTOGRAM W/LOWER EXTREMITY;  Surgeon: Maeola Harman, MD;  Location: [facility] INVASIVE CV LAB;  Service: Cardiovascular;  Laterality: N/A;   BACK SURGERY     CARDIAC CATHETERIZATION     CATARACT EXTRACTION W/ INTRAOCULAR LENS  IMPLANT, BILATERAL Bilateral 01/2012 - 02/2012   CORONARY ATHERECTOMY N/A 04/27/2020   Procedure: CORONARY ATHERECTOMY;  Surgeon: Tonny Bollman, MD;  Location: [facility] INVASIVE  CV LAB;  Service: Cardiovascular;  Laterality: N/A;   CORONARY IMAGING/OCT N/A 04/27/2020   Procedure: INTRAVASCULAR IMAGING/OCT;  Surgeon: Tonny Bollman, MD;  Location: [facility] INVASIVE CV LAB;  Service: Cardiovascular;  Laterality: N/A;   CORONARY PRESSURE/FFR STUDY N/A 04/27/2020   Procedure: INTRAVASCULAR PRESSURE WIRE/FFR STUDY;  Surgeon: Tonny Bollman, MD;  Location: [facility] INVASIVE CV LAB;  Service: Cardiovascular;  Laterality: N/A;   CORONARY STENT INTERVENTION N/A 04/27/2020   Procedure: CORONARY STENT INTERVENTION;  Surgeon: Tonny Bollman, MD;  Location: [facility] INVASIVE CV LAB;  Service: Cardiovascular;  Laterality: N/A;   DIRECT LARYNGOSCOPY Left 10/09/2017   Procedure: DIRECT LARYNGOSCOPY WITH BOPSY;  Surgeon: Flo Shanks, MD;  Location: [facility];  Service: ENT;  Laterality: Left;   ESOPHAGOGASTRODUODENOSCOPY (EGD) WITH ESOPHAGEAL DILATION     "couple times" (07/19/2016)   ESOPHAGOSCOPY Left 10/09/2017   Procedure: ESOPHAGOSCOPY;  Surgeon: Flo Shanks, MD;  Location: [facility];  Service: ENT;  Laterality: Left;   IR GASTROSTOMY TUBE MOD SED  01/08/2018   IR THORACENTESIS ASP PLEURAL SPACE W/IMG GUIDE  07/19/2016   LAPAROSCOPIC CHOLECYSTECTOMY  1984   LUMBAR DISC SURGERY  05/1996   L4-5; Brandon. Rosanne Sack LAMINECTOMY/DECOMPRESSION MICRODISCECTOMY  10/2002   L3-4. Brandon. Newell Coral   MULTIPLE TOOTH EXTRACTIONS  1980s   PARTIAL GLOSSECTOMY  12/02/2017   Brandon. Hezzie Bump- [facility]   pharyngoplasty for closure of tingue base defect  12/02/2017   Brandon. Hezzie Bump- [facility]   PICC LINE INSERTION  07/15/2016   POSTERIOR LUMBAR FUSION  09/1996   Ray cage, L4-5 Brandon. Jule Ser   PROSTATE BIOPSY  ~ 2017   radical tonsillectomy Left 12/02/2017   Brandon. Hezzie Bump at [facility]   RIGHT HEART CATH AND CORONARY ANGIOGRAPHY N/A 03/31/2020   Procedure: RIGHT HEART CATH AND CORONARY ANGIOGRAPHY;  Surgeon: Laurey Morale, MD;  Location: [facility] INVASIVE CV LAB;  Service: Cardiovascular;  Laterality: N/A;   RIGID BRONCHOSCOPY Left 10/09/2017   Procedure: RIGID BRONCHOSCOPY;  Surgeon: Flo Shanks, MD;  Location: [facility];  Service: ENT;  Laterality: Left;   TEE WITHOUT CARDIOVERSION N/A 06/07/2020   Procedure: TRANSESOPHAGEAL ECHOCARDIOGRAM (TEE);  Surgeon: Tonny Bollman, MD;  Location: [facility] OR;  Service: Open Heart Surgery;  Laterality: N/A;   TONSILLECTOMY     TOTAL HIP ARTHROPLASTY Left 12/04/2020   Procedure: TOTAL HIP ARTHROPLASTY ANTERIOR APPROACH;  Surgeon: Samson Frederic, MD;  Location: [facility] ORS;  Service: Orthopedics;  Laterality: Left;   TOTAL HIP ARTHROPLASTY Left 11/2020   TRACHEOSTOMY  12/02/2017   Brandon. Hezzie Bump- [facility]   ULTRASOUND GUIDANCE FOR VASCULAR ACCESS Right 06/07/2020   Procedure: ULTRASOUND GUIDANCE FOR VASCULAR ACCESS;  Surgeon: Tonny Bollman, MD;  Location: [facility] OR;  Service: Open Heart Surgery;  Laterality: Right;   ULTRASOUND GUIDANCE FOR VASCULAR ACCESS Bilateral 03/03/2021   Procedure: ULTRASOUND GUIDANCE FOR VASCULAR ACCESS, BILATERAL FEMORAL  ARTERIES;  Surgeon: Maeola Harman, MD;  Location: [facility] OR;  Service: Vascular;  Laterality: Bilateral;   VASCULAR SURGERY     Social History   Social History Narrative   ** Merged History Encounter **       Married (3rd), [place]. 2 children from 1st marriage, 4 step children.    Retired on disability due to back    Former Runner, broadcasting/film/video.   restores antique furniture for a hobby.       Cell # [id number]   Immunization History  Administered Date(s) Administered   Fluad Quad(high Dose 65+) 10/15/2018,  11/06/2019, 10/20/2020   Influenza Split 11/26/2010, 10/11/2011, 11/03/2012   Influenza Whole 11/14/2006, 12/04/2007, 10/06/2008, 10/25/2009, 11/26/2021   Influenza, High Dose Seasonal PF 11/01/2016, 10/30/2017   Influenza, Seasonal, Injecte, Preservative Fre 12/07/2014   Influenza,inj,Quad PF,6+ Mos 11/04/2015   Influenza-Unspecified 11/19/2013   PFIZER(Purple Top)SARS-COV-2 Vaccination 02/25/2019, 03/18/2019, 11/16/2019   Pneumococcal Conjugate-13 12/07/2014   Pneumococcal Polysaccharide-23 05/30/2017   Tdap 01/28/2020     Objective: Vital Signs: BP (!) 114/55 (BP Location: Left Arm, Patient Position: Sitting, Cuff Size: Normal)   Pulse 88   Resp 17   Ht 5' 8.5" (1.74 m)   Wt 152 lb (68.9 kg)   BMI 22.78 kg/m    Physical Exam Vitals and nursing note reviewed.  Constitutional:      Appearance: He is well-developed.  HENT:     Head: Normocephalic and atraumatic.  Eyes:     Conjunctiva/sclera: Conjunctivae normal.     Pupils: Pupils are equal, round, and reactive to light.  Cardiovascular:     Rate and Rhythm: Normal rate and regular rhythm.     Heart sounds: Normal heart sounds.  Pulmonary:     Effort: Pulmonary effort is normal.     Breath sounds: Normal breath sounds.  Abdominal:     General: Bowel sounds are normal.     Palpations: Abdomen is soft.  Musculoskeletal:     Cervical back: Normal range of motion and neck supple.   Skin:    General: Skin is warm and dry.     Capillary Refill: Capillary refill takes less than 2 seconds.  Neurological:     Mental Status: He is alert and oriented to person, place, and time.  Psychiatric:        Behavior: Behavior normal.      Musculoskeletal Exam: C-spine has limited range of motion with lateral rotation.  Thoracic kyphosis noted.  Shoulder joints have good range of motion with some discomfort in the left shoulder with full range of motion.  Elbow joints, wrist joints, MCPs, PIPs, DIPs have good range of motion with no synovitis.  CMC joint prominence and mild subluxation noted bilaterally.  Some tenderness over both CMC joints.  No tenderness or synovitis over MCP joints noted today.  Some PIP and DIP prominence noted.  Left hip replacement has good range of motion.  Right hip joint has slightly limited range of motion but no groin pain.  Both knee joints have good range of motion with no warmth or effusion.  Ankle joints have good range of motion with no tenderness or joint swelling.  CDAI Exam: CDAI Score: -- Patient Global: --; Provider Global: -- Swollen: --; Tender: -- Joint Exam 05/23/2022   No joint exam has been documented for this visit   There is currently no information documented on the homunculus. Go to the Rheumatology activity and complete the homunculus joint exam.  Investigation: No additional findings.  Imaging: VAS [place] EVAR DUPLEX  Result Date: 05/03/2022 Endovascular Aortic Repair Study (EVAR) Patient Name:  ARSENE DESHAZIER  Date of Exam:   05/02/2022 Medical Rec #: [id number]     Accession #:    [id number] Date of Birth: January 31, 1941     Patient Gender: M Patient Age:   33 years Exam Location:  [facility] Procedure:      VAS [place] EVAR DUPLEX Referring Phys: Lemar Livings --------------------------------------------------------------------------------  Indications: Follow up exam for EVAR 03/03/2021. Risk Factors: Hypertension,  hyperlipidemia, coronary artery disease. Other Factors: CT 03/24/2021 revealed a type  2 endoleak from [facility]. Vascular Interventions: Bilateral common iliac stents 04/10/2021. Limitations: Air/bowel gas.  Comparison Study: Prior EVAR 05/24/2021 measured 5.12 x 5.18 cm. Performing Technologist: Elita Quick RVT  Examination Guidelines: A complete evaluation includes B-mode imaging, spectral Doppler, color Doppler, and power Doppler as needed of all accessible portions of each vessel. Bilateral testing is considered an integral part of a complete examination. Limited examinations for reoccurring indications may be performed as noted.  Endovascular Aortic Repair (EVAR): +----------+----------------+-------------------+-------------------+           Diameter AP (cm)Diameter Trans (cm)Velocities (cm/sec) +----------+----------------+-------------------+-------------------+ Aorta     4.86            5.04               116                 +----------+----------------+-------------------+-------------------+ Right Limb1.39            1.28               101                 +----------+----------------+-------------------+-------------------+ Left Limb 1.20            1.23               57                  +----------+----------------+-------------------+-------------------+ +-------------+--------------+ Endoleak TypeNot visualized +-------------+--------------+  Summary: Patent endovascular aneurysm repair . Unable to visualized endoleak. Bilateral common iliac stents appear patent, limited visualization.  *See table(s) above for measurements and observations.  Electronically signed by Waverly Ferrari MD on 05/03/2022 at 8:31:32 AM.    Final     Recent Labs: Lab Results  Component Value Date   WBC 10.7 02/19/2022   HGB 9.9 (L) 02/19/2022   PLT 197 02/19/2022   NA 135 02/19/2022   K 5.5 (H) 02/19/2022   CL 100 02/19/2022   CO2 27 02/19/2022   GLUCOSE 139 (H) 02/19/2022   BUN 33 (H) 02/19/2022    CREATININE 0.91 02/19/2022   BILITOT 0.6 02/19/2022   ALKPHOS 125 01/18/2022   AST 16 02/19/2022   ALT 23 02/19/2022   PROT 5.7 (L) 02/19/2022   ALBUMIN 2.2 (L) 01/19/2022   CALCIUM 8.7 02/19/2022   GFRAA 107 08/11/2020   QFTBGOLDPLUS NEGATIVE 01/11/2020    Speciality Comments:  Fosamax started on January 26, 2021.  If he has dysphagia with Fosamax or any GI intolerance we will have to switch him to Reclast.  Procedures:  No procedures performed Allergies: Tape and Celebrex [celecoxib]   Assessment / Plan:     Visit Diagnoses: Polymyalgia rheumatica - History of muscle weakness and tenderness, elevated sed rate. Prednisone responsive: He has not had any signs or symptoms of a polymyalgia rheumatica flare.  He has been taking prednisone 8 mg by mouth daily.  On examination he has no difficulty raising his arm above his head or rising from a seated position.  He has intermittent discomfort in his left shoulder but has not felt like a PMR flare.  Plan on trying to reduce prednisone by 1 mg every 2 months.  He was advised to notify us if he develops signs or symptoms of a flare.  Patient declined to have sed rate rechecked today.  Sed rate will likely be rechecked at his follow-up visit to ensure it is trending down.   He will follow up in 3 months or sooner if needed.  Long term (current) use of systemic steroids - Patient is currently on prednisone 8 mg daily.  Plan to try reducing prednisone by 1 mg every 2 months.  He is aware of the risks of long-term prednisone use.  A future order for an updated bone density was placed today.  Has not been taking Fosamax consistently.  Primary osteoarthritis of both hands: CMC, PIP, DIP thickening consistent with osteoarthritis of both hands.  He experiences intermittent discomfort in both CMC joints.  He has constant soreness in both hands but has no active inflammation at this time.  He did not notice any benefit increasing his prednisone from 5 mg  to 8 mg after his last office visit.  Plan to try reducing prednisone by 1 mg every 2 months as originally planned.  He will notify us if he develops any new or worsening symptoms.  Status post total hip replacement, left - 12/04/20: due to a fall.  Doing well.  Good range of motion with no groin pain currently.  DDD (degenerative disc disease), lumbar: Pain.  Patient had a lower back injection performed today.  He remains under the care of pain management.  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  He has been eating shrimp and sausage more frequently which is likely the reason for his flare.  He has been taking allopurinol 200 mg daily by mouth daily.  He continues to tolerate allopurinol without any side effects and has not missed any doses recently.  Discussed the importance of avoiding a purine rich diet.  He was given an informational handout with dietary recommendations for gout patients to follow-up.  He was advised to notify us if he continues to have recurrent flares. Requested to hold off on lab work today and would like to have lab work checked at his follow-up visit.  A future order for uric acid will be placed today.  Osteopenia of multiple sites - DEXA scan 07/21/20: showed T score -2.2 in the right femoral neck, BMD 0.638. He was started on Fosamax 70 mg 1 tablet once weekly in December 2022 but has not been taking it consistently.  An updated order for bone density was placed today for further evaluation.  Different treatment options will be discussed pending results.- Plan: DG BONE DENSITY (DXA)  Vitamin D deficiency: Future order for vitamin D will be placed today.  Chronic pain syndrome - Followed by Brandon [name].   Psoriasis: No active psoriasis at this time.  Other  medical conditions are listed as follows:  Oropharyngeal dysphagia  Liver abscess  History of diverticulitis  Aneurysm of infrarenal abdominal aorta, unspecified whether ruptured  History of aortic valve replacement  History of gastroesophageal reflux (GERD)  History of hyperlipidemia  Essential hypertension: Blood pressure was 114/55 today in the office.  Cancer of tonsillar fossa  Anxiety and depression  Former smoker  History of atherectomy  Orders: Orders Placed This Encounter  Procedures   DG BONE DENSITY (DXA)   Uric acid   VITAMIN D 25 Hydroxy (Vit-D Deficiency, Fractures)   Meds ordered this encounter  Medications   predniSONE (DELTASONE) 5 MG tablet    Sig: TAKE 1 TABLET(5 MG) BY MOUTH DAILY WITH BREAKFAST    Dispense:  90 tablet    Refill:  0   predniSONE (DELTASONE) 1 MG tablet  Sig: Take 2 tablets (2 mg total) by mouth daily with breakfast. Take in addition to 5 mg tablet to equal 7 mg. Taper by 1 mg every 2 months.    Dispense:  120 tablet    Refill:  0      Follow-Up Instructions: Return in about 3 months (around 08/22/2022) for Polymyalgia Rheumatica, Gout, Osteoarthritis.   Gearldine Bienenstock, PA-C  Note - This record has been created using Dragon software.  Chart creation errors have been sought, but may not always  have been located. Such creation errors do not reflect on  the standard of medical care.

## 2022-05-16 ENCOUNTER — Encounter
Payer: Medicare Other | Attending: Physical Medicine and Rehabilitation | Admitting: Physical Medicine and Rehabilitation

## 2022-05-16 ENCOUNTER — Encounter: Payer: Self-pay | Admitting: Physical Medicine and Rehabilitation

## 2022-05-16 VITALS — BP 135/57 | HR 68 | Ht 68.0 in | Wt 158.0 lb

## 2022-05-16 DIAGNOSIS — F418 Other specified anxiety disorders: Secondary | ICD-10-CM | POA: Diagnosis not present

## 2022-05-16 DIAGNOSIS — Z79891 Long term (current) use of opiate analgesic: Secondary | ICD-10-CM | POA: Diagnosis not present

## 2022-05-16 DIAGNOSIS — C108 Malignant neoplasm of overlapping sites of oropharynx: Secondary | ICD-10-CM | POA: Diagnosis not present

## 2022-05-16 DIAGNOSIS — Z5181 Encounter for therapeutic drug level monitoring: Secondary | ICD-10-CM | POA: Insufficient documentation

## 2022-05-16 DIAGNOSIS — M1A09X0 Idiopathic chronic gout, multiple sites, without tophus (tophi): Secondary | ICD-10-CM | POA: Diagnosis not present

## 2022-05-16 DIAGNOSIS — G894 Chronic pain syndrome: Secondary | ICD-10-CM | POA: Diagnosis not present

## 2022-05-16 DIAGNOSIS — M353 Polymyalgia rheumatica: Secondary | ICD-10-CM | POA: Diagnosis not present

## 2022-05-16 MED ORDER — CITALOPRAM HYDROBROMIDE 40 MG PO TABS: 40.0000 mg | ORAL_TABLET | Freq: Every day | ORAL | 5 refills | Status: DC

## 2022-05-16 MED ORDER — OXYCODONE HCL 10 MG PO TABS: 10.0000 mg | ORAL_TABLET | Freq: Four times a day (QID) | ORAL | 0 refills | Status: DC | PRN

## 2022-05-16 MED ORDER — MORPHINE SULFATE ER 15 MG PO TBCR: 15.0000 mg | EXTENDED_RELEASE_TABLET | Freq: Two times a day (BID) | ORAL | 0 refills | Status: DC

## 2022-05-16 MED ORDER — OXYCODONE HCL 10 MG PO TABS: 10.0000 mg | ORAL_TABLET | ORAL | 0 refills | Status: DC | PRN

## 2022-05-16 NOTE — Progress Notes (Signed)
Subjective:    Patient ID: Brandon [name], male    DOB: 08-22-1940, 82 y.o.   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 S/p L THR- 12/02/20  Getting AAA repair as of 1/23. Pt has had a recent  5th R finger fx- out of brace now.      Here for f/u on chronic pain.     Shot got 4 months ago- wore off 1.5 weeks ago-  Getting another back injection next week from [facility].   Meds doing great- been able to sustain working more than was- normal doing projects.   Medicine for mood- last 5 weeks- feeling more depressed, actually more lousy,  Just hasn't felt very happy-   MS Contin and Oxycodone are hs pain meds  Doesn't think filled Tramadol Rx got from Dr Red Christians- doesn't think got filled- we discussed that if anyone offers other pain meds again, that cnanot take them.  Has gout in B/L hands and hands were swollen up like boxing gloves ~ 1 week ago.  L >>R Sometimes hands hurt like feels like will cry.  Doesn't get treated with current pain meds- needs Rheum to treat- which he is- taking 8 mg Prednisone right now.  Uric acid was great so he doesn't understand why they are hurting so much.    Goes to see Cancer doctor today- cancer free 6 years.    Pain Inventory Average Pain 6 Pain Right Now 6 My pain is sharp and aching  In the last 24 hours, has pain interfered with the following? General activity 5 Relation with others 4 Enjoyment of life 5 What TIME of day is your pain at its worst? evening Sleep (in general) Good  Pain is worse with: walking, bending, and standing Pain improves with:  rest, medication, and injections Relief from Meds: 8  Family History  Problem Relation Age of Onset   Heart disease Father 47       Living   Coronary artery disease Father        CABG   Alzheimer's disease Mother 41       Deceased   Arthritis Mother    Aneurysm Brother    Stomach cancer Maternal Uncle    Brain cancer Maternal Aunt        x2   Obesity Daughter        Had Bypass Sx   Social History   Socioeconomic History   Marital status: Married    Spouse name: etta   Number of children: 2   Years of education: Not on file   Highest education level: Not on file  Occupational History   Occupation: retired    Associate Professor: RETIRED    Comment: disabled due to back problems  Tobacco Use   Smoking status: Former    Packs/day: 1.50    Years: 50.00    Additional pack years: 0.00    Total pack years: 75.00    Types: Cigarettes    Quit date: 09/05/2017    Years since  quitting: 4.6    Passive exposure: Never   Smokeless tobacco: Never   Tobacco comments:    smoked less than 1 ppd for 40+ years;   Vaping Use   Vaping Use: Never used  Substance and Sexual Activity   Alcohol use: Not Currently   Drug use: Yes    Types: Oxycodone    Comment: has prescription   Sexual activity: Not Currently  Other Topics Concern   Not on file  Social History Narrative   ** Merged History Encounter **       Married (3rd), [place]. 2 children from 1st marriage, 4 step children.    Retired on disability due to back    Former Runner, broadcasting/film/video.   restores antique furniture for a hobby.       Cell # [phone]   Social Determinants of Health   Financial Resource Strain: Low Risk  (06/07/2021)   Overall Financial Resource Strain (CARDIA)    Difficulty of Paying Living Expenses: Not hard at all  Food Insecurity: No Food Insecurity (01/18/2022)   Hunger Vital Sign    Worried About Running Out of Food in the Last Year: Never true    Ran Out of Food in the Last Year: Never true   Transportation Needs: No Transportation Needs (01/23/2022)   PRAPARE - Administrator, Civil Service (Medical): No    Lack of Transportation (Non-Medical): No  Physical Activity: Inactive (06/07/2021)   Exercise Vital Sign    Days of Exercise per Week: 0 days    Minutes of Exercise per Session: 0 min  Stress: No Stress Concern Present (06/07/2021)   [employer] of Occupational Health - Occupational Stress Questionnaire    Feeling of Stress : Not at all  Social Connections: Moderately Integrated (06/07/2021)   Social Connection and Isolation Panel [NHANES]    Frequency of Communication with Friends and Family: Twice a week    Frequency of Social Gatherings with Friends and Family: Twice a week    Attends Religious Services: More than 4 times per year    Active Member of [employer] or Organizations: No    Attends Banker Meetings: Never    Marital Status: Married   Past Surgical History:  Procedure Laterality Date   ABDOMINAL AORTIC ENDOVASCULAR STENT GRAFT N/A 03/03/2021   Procedure: ABDOMINAL AORTIC ENDOVASCULAR STENT GRAFT;  Surgeon: Maeola Harman, MD;  Location: [facility] OR;  Service: Vascular;  Laterality: N/A;   ABDOMINAL AORTOGRAM W/LOWER EXTREMITY N/A 04/10/2021   Procedure: ABDOMINAL AORTOGRAM W/LOWER EXTREMITY;  Surgeon: Maeola Harman, MD;  Location: [facility] INVASIVE CV LAB;  Service: Cardiovascular;  Laterality: N/A;   BACK SURGERY     CARDIAC CATHETERIZATION     CATARACT EXTRACTION W/ INTRAOCULAR LENS  IMPLANT, BILATERAL Bilateral 01/2012 - 02/2012   CORONARY ATHERECTOMY N/A 04/27/2020   Procedure: CORONARY ATHERECTOMY;  Surgeon: Tonny Bollman, MD;  Location: [facility] INVASIVE CV LAB;  Service: Cardiovascular;  Laterality: N/A;   CORONARY IMAGING/OCT N/A 04/27/2020   Procedure: INTRAVASCULAR IMAGING/OCT;  Surgeon: Tonny Bollman, MD;  Location: [facility] INVASIVE CV LAB;  Service: Cardiovascular;  Laterality: N/A;   CORONARY PRESSURE/FFR STUDY N/A  04/27/2020   Procedure: INTRAVASCULAR PRESSURE WIRE/FFR STUDY;  Surgeon: Tonny Bollman, MD;  Location: [facility] INVASIVE CV LAB;  Service: Cardiovascular;  Laterality: N/A;   CORONARY STENT INTERVENTION N/A 04/27/2020   Procedure: CORONARY STENT INTERVENTION;  Surgeon: Tonny Bollman, MD;  Location: [facility] INVASIVE CV LAB;  Service: Cardiovascular;  Laterality:  N/A;   DIRECT LARYNGOSCOPY Left 10/09/2017   Procedure: DIRECT LARYNGOSCOPY WITH BOPSY;  Surgeon: Flo Shanks, MD;  Location: [facility];  Service: ENT;  Laterality: Left;   ESOPHAGOGASTRODUODENOSCOPY (EGD) WITH ESOPHAGEAL DILATION     "couple times" (07/19/2016)   ESOPHAGOSCOPY Left 10/09/2017   Procedure: ESOPHAGOSCOPY;  Surgeon: Flo Shanks, MD;  Location: [facility];  Service: ENT;  Laterality: Left;   IR GASTROSTOMY TUBE MOD SED  01/08/2018   IR THORACENTESIS ASP PLEURAL SPACE W/IMG GUIDE  07/19/2016   LAPAROSCOPIC CHOLECYSTECTOMY  1984   LUMBAR DISC SURGERY  05/1996   L4-5; Dr. Rosanne Sack LAMINECTOMY/DECOMPRESSION MICRODISCECTOMY  10/2002   L3-4. Dr. Newell Coral   MULTIPLE TOOTH EXTRACTIONS  1980s   PARTIAL GLOSSECTOMY  12/02/2017   Dr. Hezzie Bump- [facility]   pharyngoplasty for closure of tingue base defect  12/02/2017   Dr. Hezzie [facility]ump- [facility]   PICC LINE INSERTION  07/15/2016   POSTERIOR LUMBAR FUSION  09/1996   Ray cage, L4-5 Dr. Jule Ser   PROSTATE BIOPSY  ~ 2017   radical tonsillectomy Left 12/02/2017   Dr. [facility]zzie [facility]ump at [facility]   RIGHT HEART CATH AND CORONARY ANGIOGRAPHY N/A 03/31/2020   Procedure: RIGHT HEART CATH AND CORONARY ANGIOGRAPHY;  Surgeon: Laurey Morale, MD;  Location: [facility] INVASIVE CV LAB;  Service: Cardiovascular;  Laterality: N/A;   RIGID BRONCHOSCOPY Left 10/09/2017   Procedure: RIGID BRONCHOSCOPY;  Surgeon: Flo Shanks, MD;  Location: [facility];  Service: ENT;  Laterality: Left;   TEE WITHOUT CARDIOVERSION N/A 06/07/2020   Procedure: TRANSESOPHAGEAL  ECHOCARDIOGRAM (TEE);  Surgeon: Tonny Bollman, MD;  Location: [facility] OR;  Service: Open Heart Surgery;  Laterality: N/A;   TONSILLECTOMY     TOTAL HIP ARTHROPLASTY Left 12/04/2020   Procedure: TOTAL HIP ARTHROPLASTY ANTERIOR APPROACH;  Surgeon: Samson Frederic, MD;  Location: [facility] ORS;  Service: Orthopedics;  Laterality: Left;   TOTAL HIP ARTHROPLASTY Left 11/2020   TRACHEOSTOMY  12/02/2017   Dr. Hezzie Bump- [facility]   ULTRASOUND GUIDANCE FOR VASCULAR ACCESS Right 06/07/2020   Procedure: ULTRASOUND GUIDANCE FOR VASCULAR ACCESS;  Surgeon: Tonny Bollman, MD;  Location: [facility] OR;  Service: Open Heart Surgery;  Laterality: Right;   ULTRASOUND GUIDANCE FOR VASCULAR ACCESS Bilateral 03/03/2021   Procedure: ULTRASOUND GUIDANCE FOR VASCULAR ACCESS, BILATERAL FEMORAL ARTERIES;  Surgeon: Maeola Harman, MD;  Location: [facility] OR;  Service: Vascular;  Laterality: Bilateral;   VASCULAR SURGERY     Past Surgical History:  Procedure Laterality Date   ABDOMINAL AORTIC ENDOVASCULAR STENT GRAFT N/A 03/03/2021   Procedure: ABDOMINAL AORTIC ENDOVASCULAR STENT GRAFT;  Surgeon: Maeola Harman, MD;  Location: [facility] OR;  Service: Vascular;  Laterality: N/A;   ABDOMINAL AORTOGRAM W/LOWER EXTREMITY N/A 04/10/2021   Procedure: ABDOMINAL AORTOGRAM W/LOWER EXTREMITY;  Surgeon: Maeola Harman, MD;  Location: [facility] INVASIVE CV LAB;  Service: Cardiovascular;  Laterality: N/A;   BACK SURGERY     CARDIAC CATHETERIZATION     CATARACT EXTRACTION W/ INTRAOCULAR LENS  IMPLANT, BILATERAL Bilateral 01/2012 - 02/2012   CORONARY ATHERECTOMY N/A 04/27/2020   Procedure: CORONARY ATHERECTOMY;  Surgeon: Tonny Bollman, MD;  Location: [facility] INVASIVE CV LAB;  Service: Cardiovascular;  Laterality: N/A;   CORONARY IMAGING/OCT N/A 04/27/2020   Procedure: INTRAVASCULAR IMAGING/OCT;  Surgeon: Tonny Bollman, MD;  Location: [facility] INVASIVE CV LAB;  Service: Cardiovascular;  Laterality: N/A;   CORONARY PRESSURE/FFR STUDY N/A 04/27/2020    Procedure: INTRAVASCULAR PRESSURE WIRE/FFR STUDY;  Surgeon: Tonny Bollman, MD;  Location: [facility]  INVASIVE CV LAB;  Service: Cardiovascular;  Laterality: N/A;   CORONARY STENT INTERVENTION N/A 04/27/2020   Procedure: CORONARY STENT INTERVENTION;  Surgeon: Tonny Bollman, MD;  Location: [facility] INVASIVE CV LAB;  Service: Cardiovascular;  Laterality: N/A;   DIRECT LARYNGOSCOPY Left 10/09/2017   Procedure: DIRECT LARYNGOSCOPY WITH BOPSY;  Surgeon: Flo Shanks, MD;  Location: [facility];  Service: ENT;  Laterality: Left;   ESOPHAGOGASTRODUODENOSCOPY (EGD) WITH ESOPHAGEAL DILATION     "couple times" (07/19/2016)   ESOPHAGOSCOPY Left 10/09/2017   Procedure: ESOPHAGOSCOPY;  Surgeon: Flo Shanks, MD;  Location: [facility];  Service: ENT;  Laterality: Left;   IR GASTROSTOMY TUBE MOD SED  01/08/2018   IR THORACENTESIS ASP PLEURAL SPACE W/IMG GUIDE  07/19/2016   LAPAROSCOPIC CHOLECYSTECTOMY  1984   LUMBAR DISC SURGERY  05/1996   L4-5; Dr. Rosanne Sack LAMINECTOMY/DECOMPRESSION MICRODISCECTOMY  10/2002   L3-4. Dr. Newell Coral   MULTIPLE TOOTH EXTRACTIONS  1980s   PARTIAL GLOSSECTOMY  12/02/2017   Dr. Hezzie Bump- [facility]   pharyngoplasty for closure of tingue base defect  12/02/2017   Dr. [facility]zzie [facility]ump- [facility]   PICC LINE INSERTION  07/15/2016   POSTERIOR LUMBAR FUSION  09/1996   Ray cage, L4-5 Dr. Jule Ser   PROSTATE BIOPSY  ~ 2017   radical tonsillectomy Left 12/02/2017   Dr. [facility]zzie Bump at [facility]   RIGHT HEART CATH AND CORONARY ANGIOGRAPHY N/A 03/31/2020   Procedure: RIGHT HEART CATH AND CORONARY ANGIOGRAPHY;  Surgeon: Laurey Morale, MD;  Location: [facility] INVASIVE CV LAB;  Service: Cardiovascular;  Laterality: N/A;   RIGID BRONCHOSCOPY Left 10/09/2017   Procedure: RIGID BRONCHOSCOPY;  Surgeon: Flo Shanks, MD;  Location: [facility];  Service: ENT;  Laterality: Left;   TEE WITHOUT CARDIOVERSION N/A 06/07/2020   Procedure: TRANSESOPHAGEAL ECHOCARDIOGRAM (TEE);   Surgeon: Tonny Bollman, MD;  Location: [facility] OR;  Service: Open Heart Surgery;  Laterality: N/A;   TONSILLECTOMY     TOTAL HIP ARTHROPLASTY Left 12/04/2020   Procedure: TOTAL HIP ARTHROPLASTY ANTERIOR APPROACH;  Surgeon: Samson Frederic, MD;  Location: [facility];  Service: Orthopedics;  Laterality: Left;   TOTAL HIP ARTHROPLASTY Left 11/2020   TRACHEOSTOMY  12/02/2017   Dr. Hezzie Bump- [facility]   ULTRASOUND GUIDANCE FOR VASCULAR ACCESS Right 06/07/2020   Procedure: ULTRASOUND GUIDANCE FOR VASCULAR ACCESS;  Surgeon: Tonny Bollman, MD;  Location: [facility] OR;  Service: Open Heart Surgery;  Laterality: Right;   ULTRASOUND GUIDANCE FOR VASCULAR ACCESS Bilateral 03/03/2021   Procedure: ULTRASOUND GUIDANCE FOR VASCULAR ACCESS, BILATERAL FEMORAL ARTERIES;  Surgeon: Maeola Harman, MD;  Location: [facility] OR;  Service: Vascular;  Laterality: Bilateral;   VASCULAR SURGERY     Past Medical History:  Diagnosis Date   Abdominal aneurysm (HCC)    Arthritis    "all over" (07/19/2016)   BPH (benign prostatic hypertrophy)    CAD (coronary artery disease)    Chicken pox    Chronic lower back pain    s/p surgical fusion   Depression    Diverticulosis    Esophageal stricture    GERD (gastroesophageal reflux disease)    Hepatitis B 1984   Hiatal hernia    History of radiation therapy 01/16/18- 03/05/18   Left Tonsil, 66 Gy in 33 fractions to high risk nodal echelons.    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 Excell Seltzerooper and Dr Laneta SimmersBartle    Severe aortic stenosis    TIA (transient ischemic attack) 1990s   hx   tonsillar ca dx'd 11/2017   Tubular adenoma of colon 2009   BP (!) 135/57   Pulse 68   Ht 5\' 8"  (1.727 m)   Wt 158 lb (71.7 kg)   SpO2 95%   BMI 24.02 kg/m   Opioid Risk Score:   Fall Risk Score:  `1  Depression  [facility]     03/09/2022   10:15 AM 02/12/2022    1:16 PM 01/23/2022    2:56 PM 12/13/2021   10:14 AM 12/05/2021    2:10 PM 10/18/2021    9:19 AM 10/06/2021    1:11 PM  Depression screen PHQ 2/9  Decreased Interest 0 1 0 0 0 0 0  Down, Depressed, Hopeless 0 1 0 0 0 1 0  PHQ - 2 Score 0 2 0 0 0 1 0  Altered sleeping      3   Tired, decreased energy      3   Change in appetite      1   Feeling bad or failure about yourself       0   Trouble concentrating      0   Moving slowly or fidgety/restless      0   Suicidal thoughts      0   PHQ-9 Score      8   Difficult doing work/chores     Not difficult at all Somewhat difficult      Review of Systems  Musculoskeletal:  Positive for back pain.       RT hip  All other systems reviewed and are negative.      Objective:   Physical Exam Awake, alert, appropriate, no assistive device, NAD Hard to form fist L worse than R- little effusion/swelling in dorsum of L hand.  Pain TTP across low back in band Nail off/new one coming in on R 5th digit- can bend at MCP, DIP and PIP but enlarged esp at DIP.      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 S/p L THR- 12/02/20  Getting AAA repair as of 1/23. Pt has had a recent  5th R finger fx- out of brace now.      Will con't MS Contin 15 mg 2x/day and Oxycodone 10 mg up to q4 hours prn #150- sent in 2 prescriptions for each- to be filled 4/16-4/17 and 5/14 or so-  2. Will increase Celexa/Citalopram to 40 mg daily for mood.    3. UDS due today since on chronic pain meds.   4. Discussed f/u with Riley LamEunice per new  clinic policy- q2 months and f/u with me q6 months- I work Mondays/Wednesday AM and Fridays   5. F/U with Riley LamEunice in 2 months and me in 6 months.   6. Going to Rheum for Gout and Polymyalgia rheumatica.  Probably  needs increase in Steroids per pt Sx's for short period.    I spent a total of   21 minutes on total care today- >50% coordination of care- due to education on new clinic policy, UDS and increasing Celexa for mood.

## 2022-05-16 NOTE — Patient Instructions (Signed)
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 S/p L THR- 12/02/20  Getting AAA repair as of 1/23. Pt has had a recent  5th R finger fx- out of brace now.      Will con't MS Contin 15 mg 2x/day and Oxycodone 10 mg up to q4 hours prn #150- sent in 2 prescriptions for each- to be filled 4/16-4/17 and 5/14 or so-  2. Will increase Celexa/Citalopram to 40 mg daily for mood.    3. UDS due today since on chronic pain meds.   4. Discussed f/u with Riley Lam per new clinic policy- q2 months and f/u with me q6 months- I work Mondays/Wednesday AM and Fridays   5. F/U with Riley Lam in 2 months and me in 6 months.

## 2022-05-17 ENCOUNTER — Other Ambulatory Visit: Payer: Self-pay | Admitting: Physical Medicine and Rehabilitation

## 2022-05-21 LAB — TOXASSURE SELECT,+ANTIDEPR,UR

## 2022-05-22 ENCOUNTER — Telehealth: Payer: Self-pay | Admitting: *Deleted

## 2022-05-22 NOTE — Telephone Encounter (Signed)
Urine drug screen for this encounter is consistent for prescribed medication 

## 2022-05-23 ENCOUNTER — Ambulatory Visit: Payer: Medicare Other | Attending: Physician Assistant | Admitting: Physician Assistant

## 2022-05-23 ENCOUNTER — Encounter: Payer: Self-pay | Admitting: Physician Assistant

## 2022-05-23 VITALS — BP 114/55 | HR 88 | Resp 17 | Ht 68.5 in | Wt 152.0 lb

## 2022-05-23 DIAGNOSIS — F32A Depression, unspecified: Secondary | ICD-10-CM

## 2022-05-23 DIAGNOSIS — Z9889 Other specified postprocedural states: Secondary | ICD-10-CM

## 2022-05-23 DIAGNOSIS — M19041 Primary osteoarthritis, right hand: Secondary | ICD-10-CM | POA: Diagnosis not present

## 2022-05-23 DIAGNOSIS — R1312 Dysphagia, oropharyngeal phase: Secondary | ICD-10-CM

## 2022-05-23 DIAGNOSIS — C090 Malignant neoplasm of tonsillar fossa: Secondary | ICD-10-CM

## 2022-05-23 DIAGNOSIS — M353 Polymyalgia rheumatica: Secondary | ICD-10-CM

## 2022-05-23 DIAGNOSIS — M8589 Other specified disorders of bone density and structure, multiple sites: Secondary | ICD-10-CM

## 2022-05-23 DIAGNOSIS — K750 Abscess of liver: Secondary | ICD-10-CM

## 2022-05-23 DIAGNOSIS — Z952 Presence of prosthetic heart valve: Secondary | ICD-10-CM

## 2022-05-23 DIAGNOSIS — M1A0210 Idiopathic chronic gout, right elbow, without tophus (tophi): Secondary | ICD-10-CM

## 2022-05-23 DIAGNOSIS — L409 Psoriasis, unspecified: Secondary | ICD-10-CM

## 2022-05-23 DIAGNOSIS — Z7952 Long term (current) use of systemic steroids: Secondary | ICD-10-CM | POA: Diagnosis not present

## 2022-05-23 DIAGNOSIS — E559 Vitamin D deficiency, unspecified: Secondary | ICD-10-CM

## 2022-05-23 DIAGNOSIS — M533 Sacrococcygeal disorders, not elsewhere classified: Secondary | ICD-10-CM | POA: Diagnosis not present

## 2022-05-23 DIAGNOSIS — Z96642 Presence of left artificial hip joint: Secondary | ICD-10-CM

## 2022-05-23 DIAGNOSIS — G894 Chronic pain syndrome: Secondary | ICD-10-CM

## 2022-05-23 DIAGNOSIS — Z87891 Personal history of nicotine dependence: Secondary | ICD-10-CM

## 2022-05-23 DIAGNOSIS — I10 Essential (primary) hypertension: Secondary | ICD-10-CM

## 2022-05-23 DIAGNOSIS — M5136 Other intervertebral disc degeneration, lumbar region: Secondary | ICD-10-CM

## 2022-05-23 DIAGNOSIS — F419 Anxiety disorder, unspecified: Secondary | ICD-10-CM

## 2022-05-23 DIAGNOSIS — I7143 Infrarenal abdominal aortic aneurysm, without rupture: Secondary | ICD-10-CM

## 2022-05-23 DIAGNOSIS — M19042 Primary osteoarthritis, left hand: Secondary | ICD-10-CM

## 2022-05-23 DIAGNOSIS — Z8719 Personal history of other diseases of the digestive system: Secondary | ICD-10-CM

## 2022-05-23 DIAGNOSIS — Z8639 Personal history of other endocrine, nutritional and metabolic disease: Secondary | ICD-10-CM

## 2022-05-23 MED ORDER — PREDNISONE 1 MG PO TABS: 2.0000 mg | ORAL_TABLET | Freq: Every day | ORAL | 0 refills | Status: DC

## 2022-05-23 MED ORDER — PREDNISONE 5 MG PO TABS: ORAL_TABLET | ORAL | 0 refills | Status: DC

## 2022-05-23 NOTE — Patient Instructions (Addendum)
Arthritis compression gloves    Hand Exercises Hand exercises can be helpful for almost anyone. These exercises can strengthen the hands, improve flexibility and movement, and increase blood flow to the hands. These results can make work and daily tasks easier. Hand exercises can be especially helpful for people who have joint pain from arthritis or have nerve damage from overuse (carpal tunnel syndrome). These exercises can also help people who have injured a hand. Exercises Most of these hand exercises are gentle stretching and motion exercises. It is usually safe to do them often throughout the day. Warming up your hands before exercise may help to reduce stiffness. You can do this with gentle massage or by placing your hands in warm water for 10-15 minutes. It is normal to feel some stretching, pulling, tightness, or mild discomfort as you begin new exercises. This will gradually improve. Stop an exercise right away if you feel sudden, severe pain or your pain gets worse. Ask your health care provider which exercises are best for you. Knuckle bend or "claw" fist  Stand or sit with your arm, hand, and all five fingers pointed straight up. Make sure to keep your wrist straight during the exercise. Gently bend your fingers down toward your palm until the tips of your fingers are touching the top of your palm. Keep your big knuckle straight and just bend the small knuckles in your fingers. Hold this position for __________ seconds. Straighten (extend) your fingers back to the starting position. Repeat this exercise 5-10 times with each hand. Full finger fist  Stand or sit with your arm, hand, and all five fingers pointed straight up. Make sure to keep your wrist straight during the exercise. Gently bend your fingers into your palm until the tips of your fingers are touching the middle of your palm. Hold this position for __________ seconds. Extend your fingers back to the starting position,  stretching every joint fully. Repeat this exercise 5-10 times with each hand. Straight fist Stand or sit with your arm, hand, and all five fingers pointed straight up. Make sure to keep your wrist straight during the exercise. Gently bend your fingers at the big knuckle, where your fingers meet your hand, and the middle knuckle. Keep the knuckle at the tips of your fingers straight and try to touch the bottom of your palm. Hold this position for __________ seconds. Extend your fingers back to the starting position, stretching every joint fully. Repeat this exercise 5-10 times with each hand. Tabletop  Stand or sit with your arm, hand, and all five fingers pointed straight up. Make sure to keep your wrist straight during the exercise. Gently bend your fingers at the big knuckle, where your fingers meet your hand, as far down as you can while keeping the small knuckles in your fingers straight. Think of forming a tabletop with your fingers. Hold this position for __________ seconds. Extend your fingers back to the starting position, stretching every joint fully. Repeat this exercise 5-10 times with each hand. Finger spread  Place your hand flat on a table with your palm facing down. Make sure your wrist stays straight as you do this exercise. Spread your fingers and thumb apart from each other as far as you can until you feel a gentle stretch. Hold this position for __________ seconds. Bring your fingers and thumb tight together again. Hold this position for __________ seconds. Repeat this exercise 5-10 times with each hand. Making circles  Stand or sit with your arm,  hand, and all five fingers pointed straight up. Make sure to keep your wrist straight during the exercise. Make a circle by touching the tip of your thumb to the tip of your index finger. Hold for __________ seconds. Then open your hand wide. Repeat this motion with your thumb and each finger on your hand. Repeat this exercise  5-10 times with each hand. Thumb motion  Sit with your forearm resting on a table and your wrist straight. Your thumb should be facing up toward the ceiling. Keep your fingers relaxed as you move your thumb. Lift your thumb up as high as you can toward the ceiling. Hold for __________ seconds. Bend your thumb across your palm as far as you can, reaching the tip of your thumb for the small finger (pinkie) side of your palm. Hold for __________ seconds. Repeat this exercise 5-10 times with each hand. Grip strengthening  Hold a stress ball or other soft ball in the middle of your hand. Slowly increase the pressure, squeezing the ball as much as you can without causing pain. Think of bringing the tips of your fingers into the middle of your palm. All of your finger joints should bend when doing this exercise. Hold your squeeze for __________ seconds, then relax. Repeat this exercise 5-10 times with each hand. Contact a health care provider if: Your hand pain or discomfort gets much worse when you do an exercise. Your hand pain or discomfort does not improve within 2 hours after you exercise. If you have any of these problems, stop doing these exercises right away. Do not do them again unless your health care provider says that you can. Get help right away if: You develop sudden, severe hand pain or swelling. If this happens, stop doing these exercises right away. Do not do them again unless your health care provider says that you can. This information is not intended to replace advice given to you by your health care provider. Make sure you discuss any questions you have with your health care provider. Document Revised: 05/05/2020 Document Reviewed: 05/12/2020 Elsevier Patient Education  2023 [employer].   Information for patients with Gout  Gout defined-Gout occurs when urate crystals accumulate in your joint causing the inflammation and intense pain of gout attack.  Urate crystals can form  when you have high levels of uric acid in your blood.  Your body produces uric acid when it breaks down prurines-substances that are found naturally in your body, as well as in certain foods such as organ meats, anchioves, herring, asparagus, and mushrooms.  Normally uric acid dissolves in your blood and passes through your kidneys into your urine.  But sometimes your body either produces too much uric acid or your kidneys excrete too little uric acid.  When this happens, uric acid can build up, forming sharp needle-like urate crystals in a joint or surrounding tissue that cause pain, inflammation and swelling.    Gout is characterized by sudden, severe attacks of pain, redness and tenderness in joints, often the joint at the base of the big toe.  Gout is complex form of arthritis that can affect anyone.  Men are more likely to get gout but women become increasingly more susceptible to gout after menopause.  An acute attack of gout can wake you up in the middle of the night with the sensation that your big toe is on fire.  The affected joint is hot, swollen and so tender that even the weight or the sheet  on it may seem intolerable.  If you experience symptoms of an acute gout attack it is important to your doctor as soon as the symptoms start.  Gout that goes untreated can lead to worsening pain and joint damage.  Risk Factors:  You are more likely to develop gout if you have high levels of uric acid in your body.    Factors that increase the uric acid level in your body include:  Lifestyle factors.  Excessive alcohol use-generally more than two drinks a day for men and more than one for women increase the risk of gout.  Medical conditions.  Certain conditions make it more likely that you will develop gout.  These include hypertension, and chronic conditions such as diabetes, high levels of fat and cholesterol in the blood, and narrowing of the arteries.  Certain medications.  The uses of  Thiazide diuretics- commonly used to treat hypertension and low dose aspirin can also increase uric acid levels.  Family history of gout.  If other members of your family have had gout, you are more likely to develop the disease.  Age and sex. Gout occurs more often in men than it does in women, primarily because women tend to have lower uric acid levels than men do.  Men are more likely to develop gout earlier usually between the ages of 26-50- whereas women generally develop signs and symptoms after menopause.    Tests and diagnosis:  Tests to help diagnose gout may include:  Blood test.  Your doctor may recommend a blood test to measure the uric acid level in your blood .  Blood tests can be misleading, though.  Some people have high uric acid levels but never experience gout.  And some people have signs and symptoms of gout, but don't have unusual levels of uric acid in their blood.  Joint fluid test.  Your doctor may use a needle to draw fluid from your affected joint.  When examined under the microscope, your joint fluid may reveal urate crystals.  Treatment:  Treatment for gout usually involves medications.  What medications you and your doctor choose will be based on your current health and other medications you currently take.  Gout medications can be used to treat acute gout attacks and prevent future attacks as well as reduce your risk of complications from gout such as the development of tophi from urate crystal deposits.  Alternative medicine:   Certain foods have been studied for their potential to lower uric acid levels, including:  Coffee.  Studies have found an association between coffee drinking (regular and decaf) and lower uric acid levels.  The evidence is not enough to encourage non-coffee drinkers to start, but it may give clues to new ways of treating gout in the future.  Vitamin C.  Supplements containing vitamin C may reduce the levels of uric acid in your blood.   However, vitamin as a treatment for gout. Don't assume that if a little vitamin C is good, than lots is better.  Megadoses of vitamin C may increase your bodies uric acid levels.  Cherries.  Cherries have been associated with lower levels of uric acid in studies, but it isn't clear if they have any effect on gout signs and symptoms.  Eating more cherries and other dar-colored fruits, such as blackberries, blueberries, purple grapes and raspberries, may be a safe way to support your gout treatment.    Lifestyle/Diet Recommendations:  Drink 8 to 16 cups ( about 2 to 4  liters) of fluid each day, with at least half being water. Avoid alcohol Eat a moderate amount of protein, preferably from healthy sources, such as low-fat or fat-free dairy, tofu, eggs, and nut butters. Limit you daily intake of meat, fish, and poultry to 4 to 6 ounces. Avoid high fat meats and desserts. Decrease you intake of shellfish, beef, lamb, pork, eggs and cheese. Choose a good source of vitamin C daily such as citrus fruits, strawberries, broccoli,  brussel sprouts, papaya, and cantaloupe.  Choose a good source of vitamin A every other day such as yellow fruits, or dark green/yellow vegetables. Avoid drastic weigh reduction or fasting.  If weigh loss is desired lose it over a period of several months. See "dietary considerations.." chart for specific food recommendations.  Dietary Considerations for people with Gout  Food with negligible purine content (0-15 mg of purine nitrogen per 100 grams food)  May use as desired except on calorie variations  Non fat milk Cocoa Cereals (except in list II) Hard candies  Buttermilk Carbonated drinks Vegetables (except in list II) Sherbet  Coffee Fruits Sugar Honey  Tea Cottage Cheese Gelatin-jell-o Salt  Fruit juice Breads Angel food Cake   Herbs/spices Jams/Jellies [employer]    Foods that do not contain excessive purine content, but must be limited due to fat  content  Cream Eggs Oil and Salad Dressing  Half and Half Peanut Butter Chocolate  Whole Milk Cakes Potato Chips  Butter Ice Cream Fried Foods  Cheese Nuts Waffles, pancakes   List II: Food with moderate purine content (50-150 mg of purine nitrogen per 100 grams of food)  Limit total amount each day to 5 oz. cooked Lean meat, other than those on list III   Poultry, other than those on list III Fish, other than those on list III   Seafood, other than those on list III  These foods may be used occasionally  Peas Lentils Bran  Spinach Oatmeal Dried Beans and Peas  Asparagus Wheat Germ Mushrooms   Additional information about meat choices  Choose fish and poultry, particularly without skin, often.  Select lean, well trimmed cuts of meat.  Avoid all fatty meats, bacon , sausage, fried meats, fried fish, or poultry, luncheon meats, cold cuts, hot dogs, meats canned or frozen in gravy, spareribs and frozen and packaged prepared meats.   List III: Foods with HIGH purine content / Foods to AVOID (150-800 mg of purine nitrogen per 100 grams of food)  Anchovies Herring Meat Broths  Liver Mackerel Meat Extracts  Kidney Scallops Meat Drippings  Sardines Wild Game Mincemeat  Sweetbreads Goose Gravy  Heart Tongue Yeast, baker's and brewers   Commercial soups made with any of the foods listed in List II or List III  In addition avoid all alcoholic beverages

## 2022-05-28 ENCOUNTER — Other Ambulatory Visit (HOSPITAL_COMMUNITY): Payer: Self-pay | Admitting: Cardiology

## 2022-06-06 ENCOUNTER — Telehealth: Payer: Self-pay | Admitting: Family Medicine

## 2022-06-06 NOTE — Telephone Encounter (Signed)
Contacted Brandon [name] to schedule their annual wellness visit. Appointment made for 06/15/22.  Brandon [name] AWV direct phone # [phone]

## 2022-06-08 ENCOUNTER — Telehealth: Payer: Self-pay | Admitting: Family Medicine

## 2022-06-08 NOTE — Telephone Encounter (Signed)
Pt states he is having dizziness that "comes and goes." He is not currently experiencing the dizziness. No other symptoms. Last episode of dizziness was yesterday afternoon. Pt not wanting to come into the office to be seen today. PT states, "I have other appointments today, some other things I need to get done today. If you could get me down for the top of the week next week." Pt scheduled with Dr. Doreene Burke on Monday, 5/6 at 2:20pm. Pt instructed to call us back today if he would like to be seen, as we do have openings and to go to UC or ED if any episodes over the weekend.

## 2022-06-08 NOTE — Telephone Encounter (Signed)
Call received from NT for pt to be seen at [facility] within 1 hour. NT stating pt refused to be seen at [facility] and wants to be seen in the office.

## 2022-06-08 NOTE — Telephone Encounter (Signed)
Caller Name: Ordie Badgett Ph #: [phone] Chief Complaint: frequent dizzy spells that last awhile and he can not function properly.   This call was transferred to Nurse Triage/Access Nurse. This is for documentation purposes. No follow up required at this time.

## 2022-06-11 ENCOUNTER — Ambulatory Visit (INDEPENDENT_AMBULATORY_CARE_PROVIDER_SITE_OTHER): Payer: Medicare Other | Admitting: Family Medicine

## 2022-06-11 ENCOUNTER — Encounter: Payer: Self-pay | Admitting: Family Medicine

## 2022-06-11 VITALS — BP 104/62 | HR 68 | Temp 97.7°F | Ht 68.0 in | Wt 153.0 lb

## 2022-06-11 DIAGNOSIS — R42 Dizziness and giddiness: Secondary | ICD-10-CM | POA: Diagnosis not present

## 2022-06-11 DIAGNOSIS — I959 Hypotension, unspecified: Secondary | ICD-10-CM | POA: Diagnosis not present

## 2022-06-11 MED ORDER — CARVEDILOL 6.25 MG PO TABS
6.2500 mg | ORAL_TABLET | Freq: Two times a day (BID) | ORAL | 3 refills | Status: DC
Start: 2022-06-11 — End: 2023-02-11

## 2022-06-11 NOTE — Progress Notes (Signed)
Established Patient Office Visit   Subjective:  Patient ID: Brandon [name], male    DOB: 02/11/1940  Age: 82 y.o. MRN: [id number]  Chief Complaint  Patient presents with   Dizziness    Concerns about dizziness, off balance seems to come and go 2 weeks. Left ear feels clogged up.     Dizziness Pertinent negatives include no abdominal pain, myalgias, rash or weakness.   Encounter Diagnoses  Name Primary?   Light headedness Yes   Hypotension, unspecified hypotension type    For evaluation of dizziness that he describes as a lightheadedness after he stands and starts to walk.  There can be a sensation of movement that is not there.  His gait feels unstable.  He may have to rest a minute with his upper body supported on a countertop.  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  He is able to clear his ears with eustachian tube exercises.  Last 3 blood pressures have been low.  His amlodipine was recently adjusted downward from 10 to 5 mg.   Review of Systems  Constitutional: Negative.   HENT: Negative.    Eyes:  Negative for blurred vision, discharge and redness.  Respiratory: Negative.    Cardiovascular: Negative.   Gastrointestinal:  Negative for abdominal pain.  Genitourinary: Negative.   Musculoskeletal: Negative.  Negative for myalgias.  Skin:  Negative for rash.  Neurological:  Positive for dizziness. Negative for tingling, loss of consciousness and weakness.  Endo/Heme/Allergies:  Negative for polydipsia.     Current Outpatient Medications:    acetaminophen (TYLENOL) 500 MG tablet, Take 1,000 mg by mouth every 8 (eight) hours as needed for mild pain or headache., Disp: , Rfl:    albuterol (VENTOLIN HFA) 108 (90 Base) MCG/ACT inhaler, Inhale 2 puffs into the lungs every 6 (six) hours as needed for wheezing or shortness of breath., Disp: 6.7 g, Rfl: 2   allopurinol (ZYLOPRIM) 100 MG tablet, TAKE 2 TABLETS(200 MG) BY  MOUTH DAILY, Disp: 180 tablet, Rfl: 0   amLODipine (NORVASC) 5 MG tablet, Take 1 tablet (5 mg total) by mouth daily., Disp: 90 tablet, Rfl: 3   aspirin EC 81 MG tablet, Take 81 mg by mouth at bedtime. Swallow whole., Disp: , Rfl:    atorvastatin (LIPITOR) 80 MG tablet, TAKE 1 TABLET(80 MG) BY MOUTH DAILY, Disp: 90 tablet, Rfl: 3   carvedilol (COREG) 6.25 MG tablet, Take 1 tablet (6.25 mg total) by mouth 2 (two) times daily with a meal., Disp: 60 tablet, Rfl: 3   citalopram (CELEXA) 40 MG tablet, Take 1 tablet (40 mg total) by mouth daily., Disp: 30 tablet, Rfl: 5   ezetimibe (ZETIA) 10 MG tablet, Take 1 tablet (10 mg total) by mouth daily. NEEDS FOLLOW UP APPOINTMENT FOR MORE REFILLS, Disp: 60 tablet, Rfl: 0   ferrous sulfate 325 (65 FE) MG EC tablet, Take 1 tablet (325 mg total) by mouth daily with breakfast., Disp: 30 tablet, Rfl: 0   FLUoxetine (PROZAC) 40 MG capsule, Take 1 capsule (40 mg total) by mouth daily., Disp: 30 capsule, Rfl: 5   fluticasone (FLONASE) 50 MCG/ACT nasal spray, SHAKE LIQUID AND USE 2 SPRAYS IN EACH NOSTRIL DAILY AS NEEDED FOR RHINITIS OR ALLERGIES (Patient taking differently: Place 2 sprays into both nostrils daily.), Disp: 16 g, Rfl: 11   furosemide (LASIX) 20 MG tablet, Take 1 tablet (20 mg total) by mouth daily., Disp: 90 tablet, Rfl: 3  gabapentin (NEURONTIN) 800 MG tablet, Take 1 tablet (800 mg total) by mouth 3 (three) times daily. Can increase to 4x/day if needed after 3 days- for nerve pain (Patient taking differently: Take 800 mg by mouth 3 (three) times daily.), Disp: 120 tablet, Rfl: 5   guaiFENesin (MUCINEX) 600 MG 12 hr tablet, Take 600 mg by mouth 2 (two) times daily as needed for cough or to loosen phlegm., Disp: , Rfl:    ipratropium (ATROVENT) 0.02 % nebulizer solution, Use 1 vial (0.5 mg total) by nebulization every 6 (six) hours as needed for wheezing or shortness of breath., Disp: 75 mL, Rfl: 12   ipratropium (ATROVENT) 0.03 % nasal spray, Place 2 sprays  into both nostrils every 12 (twelve) hours., Disp: 30 mL, Rfl: 12   levalbuterol (XOPENEX) 0.63 MG/3ML nebulizer solution, Use 1 vial (0.63 mg total) by nebulization every 6 (six) hours as needed for wheezing or shortness of breath., Disp: 75 mL, Rfl: 12   morphine (MS CONTIN) 15 MG 12 hr tablet, Take 1 tablet (15 mg total) by mouth every 12 (twelve) hours., Disp: 60 tablet, Rfl: 0   morphine (MS CONTIN) 15 MG 12 hr tablet, Take 1 tablet (15 mg total) by mouth every 12 (twelve) hours. Fill 06/19/22, Disp: 60 tablet, Rfl: 0   naloxone (NARCAN) nasal spray 4 mg/0.1 mL, For possible accidentally overdose., Disp: 2 each, Rfl: 5   oxyCODONE 10 MG TABS, Take 1 tablet (10 mg total) by mouth every 4 (four) hours as needed for severe pain. Fill 5/14, Disp: 150 tablet, Rfl: 0   Oxycodone HCl 10 MG TABS, Take 1 tablet (10 mg total) by mouth every 6 (six) hours as needed (severe pain). for chronic pain, Disp: 150 tablet, Rfl: 0   pantoprazole (PROTONIX) 40 MG tablet, Take 1 tablet (40 mg total) by mouth daily. (Patient taking differently: Take 40 mg by mouth daily as needed (for heartburn).), Disp: 30 tablet, Rfl: 2   potassium chloride (KLOR-CON) 10 MEQ tablet, Take 10 mEq by mouth 4 (four) times daily., Disp: , Rfl:    predniSONE (DELTASONE) 1 MG tablet, Take 2 tablets (2 mg total) by mouth daily with breakfast. Take in addition to 5 mg tablet to equal 7 mg. Taper by 1 mg every 2 months., Disp: 120 tablet, Rfl: 0   predniSONE (DELTASONE) 5 MG tablet, TAKE 1 TABLET(5 MG) BY MOUTH DAILY WITH BREAKFAST, Disp: 90 tablet, Rfl: 0   tamsulosin (FLOMAX) 0.4 MG CAPS capsule, Take 0.4 mg by mouth See admin instructions. Take 0.4 mg by mouth one to two times a day, Disp: , Rfl:    Tiotropium Bromide Monohydrate (SPIRIVA RESPIMAT) 2.5 MCG/ACT AERS, Inhale 2 puffs into the lungs daily., Disp: 4 g, Rfl: 6   traMADol (ULTRAM) 50 MG tablet, Take 2 tablets (100 mg total) by mouth every 8 (eight) hours as needed for moderate pain.,  Disp: 120 tablet, Rfl: 5   triamcinolone cream (KENALOG) 0.1 %, Apply 1 Application topically 2 (two) times daily as needed (to itchy sites)., Disp: , Rfl:    trolamine salicylate (ASPERCREME) 10 % cream, Apply 1 Application topically 2 (two) times daily as needed (arthritis pain)., Disp: , Rfl:    hydrOXYzine (ATARAX) 10 MG tablet, Take 1-2 tablets (10-20 mg total) by mouth at bedtime as needed for itching (or sleep). (Patient not taking: Reported on 06/11/2022), Disp: 30 tablet, Rfl: 3   Objective:     BP 104/62 (BP Location: Left Arm, Patient Position: Sitting, Cuff  Size: Normal)   Pulse 68   Temp 97.7 F (36.5 C) (Temporal)   Ht 5\' 8"  (1.727 m)   Wt 153 lb (69.4 kg)   SpO2 94%   BMI 23.26 kg/m  BP Readings from Last 3 Encounters:  06/11/22 104/62  05/23/22 (!) 114/55  05/16/22 (!) 135/57   Wt Readings from Last 3 Encounters:  06/11/22 153 lb (69.4 kg)  05/23/22 152 lb (68.9 kg)  05/16/22 158 lb (71.7 kg)      Physical Exam Constitutional:      General: He is not in acute distress.    Appearance: Normal appearance. He is not ill-appearing, toxic-appearing or diaphoretic.  HENT:     Head: Normocephalic and atraumatic.     Right Ear: Tympanic membrane, ear canal and external ear normal.     Left Ear: Tympanic membrane, ear canal and external ear normal.     Mouth/Throat:     Mouth: Mucous membranes are moist.     Pharynx: Oropharynx is clear. No oropharyngeal exudate or posterior oropharyngeal erythema.  Eyes:     General: No scleral icterus.       Right eye: No discharge.        Left eye: No discharge.     Extraocular Movements: Extraocular movements intact.     Conjunctiva/sclera: Conjunctivae normal.     Pupils: Pupils are equal, round, and reactive to light.  Cardiovascular:     Rate and Rhythm: Normal rate and regular rhythm.  Pulmonary:     Effort: Pulmonary effort is normal. No respiratory distress.     Breath sounds: Decreased air movement present.   Musculoskeletal:     Cervical back: No rigidity or tenderness.  Lymphadenopathy:     Cervical: No cervical adenopathy.  Skin:    General: Skin is warm and dry.  Neurological:     Mental Status: He is alert and oriented to person, place, and time.  Psychiatric:        Mood and Affect: Mood normal.        Behavior: Behavior normal.      No results found for any visits on 06/11/22.    The ASCVD Risk score (Arnett DK, et al., 2019) failed to calculate for the following reasons:   The patient has a prior MI or stroke diagnosis    Assessment & Plan:   Light headedness  Hypotension, unspecified hypotension type -     Carvedilol; Take 1 tablet (6.25 mg total) by mouth 2 (two) times daily with a meal.  Dispense: 60 tablet; Refill: 3    Return in about 4 weeks (around 07/09/2022), or Will stop carvedilol 12.5 and start carvedilol 6.25 twice daily..  Follow-up with Dr. Veto Kemps in 4 weeks.  Be mindful of hydration.  Be mindful of salt load.   Mliss Sax, MD

## 2022-06-15 ENCOUNTER — Ambulatory Visit (INDEPENDENT_AMBULATORY_CARE_PROVIDER_SITE_OTHER): Payer: Medicare Other

## 2022-06-15 VITALS — Ht 68.0 in | Wt 150.0 lb

## 2022-06-15 DIAGNOSIS — Z0000 Encounter for general adult medical examination without abnormal findings: Secondary | ICD-10-CM

## 2022-06-15 NOTE — Progress Notes (Signed)
I connected with  [name] on 06/15/22 by a audio enabled telemedicine application and verified that I am speaking with the correct person using two identifiers.  Patient Location: Home  Provider Location: Office/Clinic  I discussed the limitations of evaluation and management by telemedicine. The patient expressed understanding and agreed to proceed.   Subjective:   Brandon [name] is a 82 y.o. male who presents for Medicare Annual/Subsequent preventive examination.  Review of Systems     Cardiac Risk Factors include: advanced age (>3men, >37 women);dyslipidemia;hypertension;male gender     Objective:    Today's Vitals   06/15/22 0941  Weight: 150 lb (68 kg)  Height: 5\' 8"  (1.727 m)   Body mass index is 22.81 kg/m.     06/15/2022    9:48 AM 01/17/2022   11:03 PM 06/28/2021   10:00 PM 06/26/2021    9:28 AM 06/07/2021    4:02 PM 04/10/2021    8:47 AM 03/31/2021   10:19 AM  Advanced Directives  Does Patient Have a Medical Advance Directive? Yes No Yes Yes Yes Yes Yes  Type of Advance Directive Out of facility DNR (pink MOST or yellow form)  Healthcare Power of [employer] of [place];Living will Living will Living will  Does patient want to make changes to medical advance directive?   No - Patient declined   No - Patient declined No - Patient declined  Copy of Healthcare Power of Attorney in Chart?   No - copy requested  No - copy requested    Would patient like information on creating a medical advance directive?  No - Patient declined         Current Medications (verified) Outpatient Encounter Medications as of 06/15/2022  Medication Sig   acetaminophen (TYLENOL) 500 MG tablet Take 1,000 mg by mouth every 8 (eight) hours as needed for mild pain or headache.   albuterol (VENTOLIN HFA) 108 (90 Base) MCG/ACT inhaler Inhale 2 puffs into the lungs every 6 (six) hours as needed for wheezing or shortness of breath.   allopurinol (ZYLOPRIM) 100 MG tablet TAKE 2  TABLETS(200 MG) BY MOUTH DAILY   amLODipine (NORVASC) 5 MG tablet Take 1 tablet (5 mg total) by mouth daily.   aspirin EC 81 MG tablet Take 81 mg by mouth at bedtime. Swallow whole.   atorvastatin (LIPITOR) 80 MG tablet TAKE 1 TABLET(80 MG) BY MOUTH DAILY   carvedilol (COREG) 6.25 MG tablet Take 1 tablet (6.25 mg total) by mouth 2 (two) times daily with a meal.   citalopram (CELEXA) 40 MG tablet Take 1 tablet (40 mg total) by mouth daily.   ezetimibe (ZETIA) 10 MG tablet Take 1 tablet (10 mg total) by mouth daily. NEEDS FOLLOW UP APPOINTMENT FOR MORE REFILLS   FLUoxetine (PROZAC) 40 MG capsule Take 1 capsule (40 mg total) by mouth daily.   fluticasone (FLONASE) 50 MCG/ACT nasal spray SHAKE LIQUID AND USE 2 SPRAYS IN EACH NOSTRIL DAILY AS NEEDED FOR RHINITIS OR ALLERGIES (Patient taking differently: Place 2 sprays into both nostrils daily.)   gabapentin (NEURONTIN) 800 MG tablet Take 1 tablet (800 mg total) by mouth 3 (three) times daily. Can increase to 4x/day if needed after 3 days- for nerve pain (Patient taking differently: Take 800 mg by mouth 3 (three) times daily.)   guaiFENesin (MUCINEX) 600 MG 12 hr tablet Take 600 mg by mouth 2 (two) times daily as needed for cough or to loosen phlegm.   ipratropium (ATROVENT) 0.02 %  nebulizer solution Use 1 vial (0.5 mg total) by nebulization every 6 (six) hours as needed for wheezing or shortness of breath.   levalbuterol (XOPENEX) 0.63 MG/3ML nebulizer solution Use 1 vial (0.63 mg total) by nebulization every 6 (six) hours as needed for wheezing or shortness of breath.   morphine (MS CONTIN) 15 MG 12 hr tablet Take 1 tablet (15 mg total) by mouth every 12 (twelve) hours.   naloxone (NARCAN) nasal spray 4 mg/0.1 mL For possible accidentally overdose.   oxyCODONE 10 MG TABS Take 1 tablet (10 mg total) by mouth every 4 (four) hours as needed for severe pain. Fill 5/14   potassium chloride (KLOR-CON) 10 MEQ tablet Take 10 mEq by mouth 4 (four) times daily.    predniSONE (DELTASONE) 1 MG tablet Take 2 tablets (2 mg total) by mouth daily with breakfast. Take in addition to 5 mg tablet to equal 7 mg. Taper by 1 mg every 2 months.   predniSONE (DELTASONE) 5 MG tablet TAKE 1 TABLET(5 MG) BY MOUTH DAILY WITH BREAKFAST   tamsulosin (FLOMAX) 0.4 MG CAPS capsule Take 0.4 mg by mouth See admin instructions. Take 0.4 mg by mouth one to two times a day   traMADol (ULTRAM) 50 MG tablet Take 2 tablets (100 mg total) by mouth every 8 (eight) hours as needed for moderate pain.   triamcinolone cream (KENALOG) 0.1 % Apply 1 Application topically 2 (two) times daily as needed (to itchy sites).   trolamine salicylate (ASPERCREME) 10 % cream Apply 1 Application topically 2 (two) times daily as needed (arthritis pain).   ferrous sulfate 325 (65 FE) MG EC tablet Take 1 tablet (325 mg total) by mouth daily with breakfast.   furosemide (LASIX) 20 MG tablet Take 1 tablet (20 mg total) by mouth daily.   hydrOXYzine (ATARAX) 10 MG tablet Take 1-2 tablets (10-20 mg total) by mouth at bedtime as needed for itching (or sleep). (Patient not taking: Reported on 06/11/2022)   ipratropium (ATROVENT) 0.03 % nasal spray Place 2 sprays into both nostrils every 12 (twelve) hours. (Patient not taking: Reported on 06/15/2022)   morphine (MS CONTIN) 15 MG 12 hr tablet Take 1 tablet (15 mg total) by mouth every 12 (twelve) hours. Fill 06/19/22   Oxycodone HCl 10 MG TABS Take 1 tablet (10 mg total) by mouth every 6 (six) hours as needed (severe pain). for chronic pain   pantoprazole (PROTONIX) 40 MG tablet Take 1 tablet (40 mg total) by mouth daily. (Patient taking differently: Take 40 mg by mouth daily as needed (for heartburn).)   Tiotropium Bromide Monohydrate (SPIRIVA RESPIMAT) 2.5 MCG/ACT AERS Inhale 2 puffs into the lungs daily. (Patient not taking: Reported on 06/15/2022)   No facility-administered encounter medications on file as of 06/15/2022.    Allergies (verified) Tape and Celebrex  [celecoxib]   History: Past Medical History:  Diagnosis Date   Abdominal aneurysm (HCC)    Arthritis    "all over" (07/19/2016)   BPH (benign prostatic hypertrophy)    CAD (coronary artery disease)    Chicken pox    Chronic lower back pain    s/p surgical fusion   Depression    Diverticulosis    Esophageal stricture    GERD (gastroesophageal reflux disease)    Hepatitis B 1984   Hiatal hernia    History of radiation therapy 01/16/18- 03/05/18   Left Tonsil, 66 Gy in 33 fractions to high risk nodal echelons.    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 Excell Seltzer and Dr Laneta Simmers    Severe aortic stenosis    TIA (transient ischemic attack) 1990s   hx   tonsillar ca dx'd 11/2017   Tubular adenoma of colon 2009   Past Surgical History:  Procedure Laterality Date   ABDOMINAL AORTIC ENDOVASCULAR STENT GRAFT N/A 03/03/2021   Procedure: ABDOMINAL AORTIC ENDOVASCULAR STENT GRAFT;  Surgeon: Maeola Harman, MD;  Location: [facility] OR;  Service: Vascular;  Laterality: N/A;   ABDOMINAL AORTOGRAM W/LOWER EXTREMITY N/A 04/10/2021   Procedure: ABDOMINAL AORTOGRAM W/LOWER EXTREMITY;  Surgeon: Maeola Harman, MD;  Location: [facility] INVASIVE CV LAB;  Service: Cardiovascular;  Laterality: N/A;   BACK SURGERY     CARDIAC CATHETERIZATION     CATARACT EXTRACTION W/ INTRAOCULAR LENS  IMPLANT, BILATERAL Bilateral 01/2012 - 02/2012   CORONARY ATHERECTOMY N/A 04/27/2020   Procedure: CORONARY ATHERECTOMY;  Surgeon: Tonny Bollman, MD;  Location[facility] INVASIVE CV LAB;  Service: Cardiovascular;  Laterality: N/A;   CORONARY IMAGING/OCT N/A 04/27/2020   Procedure: INTRAVASCULAR IMAGING/OCT;  Surgeon: Tonny Bollman, MD;  Location: [facility] INVASIVE CV LAB;  Service: Cardiovascular;  Laterality: N/A;   CORONARY PRESSURE/FFR  STUDY N/A 04/27/2020   Procedure: INTRAVASCULAR PRESSURE WIRE/FFR STUDY;  Surgeon: Tonny Bollman, MD;  Location: [facility] INVASIVE CV LAB;  Service: Cardiovascular;  Laterality: N/A;   CORONARY STENT INTERVENTION N/A 04/27/2020   Procedure: CORONARY STENT INTERVENTION;  Surgeon: Tonny Bollman, MD;  Location: [facility] INVASIVE CV LAB;  Service: Cardiovascular;  Laterality: N/A;   DIRECT LARYNGOSCOPY Left 10/09/2017   Procedure: DIRECT LARYNGOSCOPY WITH BOPSY;  Surgeon: Flo Shanks, MD;  Location: [facility];  Service: ENT;  Laterality: Left;   ESOPHAGOGASTRODUODENOSCOPY (EGD) WITH ESOPHAGEAL DILATION     "couple times" (07/19/2016)   ESOPHAGOSCOPY Left 10/09/2017   Procedure: ESOPHAGOSCOPY;  Surgeon: Flo Shanks, MD;  Location: [facility];  Service: ENT;  Laterality: Left;   IR GASTROSTOMY TUBE MOD SED  01/08/2018   IR THORACENTESIS ASP PLEURAL SPACE W/IMG GUIDE  07/19/2016   LAPAROSCOPIC CHOLECYSTECTOMY  1984   LUMBAR DISC SURGERY  05/1996   L4-5; Dr. Rosanne Sack LAMINECTOMY/DECOMPRESSION MICRODISCECTOMY  10/2002   L3-4. Dr. Newell Coral   MULTIPLE TOOTH EXTRACTIONS  1980s   PARTIAL GLOSSECTOMY  12/02/2017   Dr. Hezzie Bump- [facility]   pharyngoplasty for closure of tingue base defect  12/02/2017   Dr. Hezzie Bump- [facility]   PICC LINE INSERTION  07/15/2016   POSTERIOR LUMBAR FUSION  09/1996   Ray cage, L4-5 Dr. Jule Ser   PROSTATE BIOPSY  ~ 2017   radical tonsillectomy Left 12/02/2017   Dr. [facility]zzie [facility]ump at [facility]   RIGHT HEART CATH AND CORONARY ANGIOGRAPHY N/A 03/31/2020   Procedure: RIGHT HEART CATH AND CORONARY ANGIOGRAPHY;  Surgeon: Laurey Morale, MD;  Location: [facility] INVASIVE CV LAB;  Service: Cardiovascular;  Laterality: N/A;   RIGID BRONCHOSCOPY Left 10/09/2017   Procedure: RIGID BRONCHOSCOPY;  Surgeon: Flo Shanks, MD;  Location: [facility];  Service: ENT;  Laterality: Left;   TEE WITHOUT CARDIOVERSION N/A 06/07/2020   Procedure: TRANSESOPHAGEAL  ECHOCARDIOGRAM (TEE);  Surgeon: Tonny Bollman, MD;  Location: [facility] OR;  Service: Open Heart Surgery;  Laterality: N/A;   TONSILLECTOMY     TOTAL HIP ARTHROPLASTY Left 12/04/2020   Procedure: TOTAL HIP ARTHROPLASTY ANTERIOR  APPROACH;  Surgeon: Samson Frederic, MD;  Location: [facility] ORS;  Service: Orthopedics;  Laterality: Left;   TOTAL HIP ARTHROPLASTY Left 11/2020   TRACHEOSTOMY  12/02/2017   Dr. [facility]zzie [facility]ump- [facility]   ULTRASOUND GUIDANCE FOR VASCULAR ACCESS Right 06/07/2020   Procedure: ULTRASOUND GUIDANCE FOR VASCULAR ACCESS;  Surgeon: Tonny Bollman, MD;  Location: [facility] OR;  Service: Open Heart Surgery;  Laterality: Right;   ULTRASOUND GUIDANCE FOR VASCULAR ACCESS Bilateral 03/03/2021   Procedure: ULTRASOUND GUIDANCE FOR VASCULAR ACCESS, BILATERAL FEMORAL ARTERIES;  Surgeon: Maeola Harman, MD;  Location: [facility] OR;  Service: Vascular;  Laterality: Bilateral;   VASCULAR SURGERY     Family History  Problem Relation Age of Onset   Heart disease Father 68       Living   Coronary artery disease Father        CABG   Alzheimer's disease Mother 12       Deceased   Arthritis Mother    Aneurysm Brother    Stomach cancer Maternal Uncle    Brain cancer Maternal Aunt        x2   Obesity Daughter        Had Bypass Sx   Social History   Socioeconomic History   Marital status: Married    Spouse name: etta   Number of children: 2   Years of education: Not on file   Highest education level: Not on file  Occupational History   Occupation: retired    Associate Professor: RETIRED    Comment: disabled due to back problems  Tobacco Use   Smoking status: Former    Packs/day: 1.50    Years: 50.00    Additional pack years: 0.00    Total pack years: 75.00    Types: Cigarettes    Quit date: 09/05/2017    Years since quitting: 4.7    Passive exposure: Never   Smokeless tobacco: Never   Tobacco comments:    smoked less than 1 ppd for 40+ years;   Vaping Use   Vaping Use: Never used  Substance and Sexual  Activity   Alcohol use: Not Currently   Drug use: Yes    Types: Oxycodone, Morphine    Comment: has prescription   Sexual activity: Not Currently  Other Topics Concern   Not on file  Social History Narrative   ** Merged History Encounter **       Married (3rd), [place]. 2 children from 1st marriage, 4 step children.    Retired on disability due to back    Former Runner, broadcasting/film/video.   restores antique furniture for a hobby.       Cell # [phone]   Social Determinants of Health   Financial Resource Strain: Low Risk  (06/15/2022)   Overall Financial Resource Strain (CARDIA)    Difficulty of Paying Living Expenses: Not hard at all  Food Insecurity: No Food Insecurity (06/15/2022)   Hunger Vital Sign    Worried About Running Out of Food in the Last Year: Never true    Ran Out of Food in the Last Year: Never true  Transportation Needs: No Transportation Needs (06/15/2022)   PRAPARE - Administrator, Civil Service (Medical): No    Lack of Transportation (Non-Medical): No  Physical Activity: Inactive (06/15/2022)   Exercise Vital Sign    Days of Exercise per Week: 0 days    Minutes of Exercise per Session: 0 min  Stress: No Stress Concern Present (06/15/2022)  [employer] of Occupational Health - Occupational Stress Questionnaire    Feeling of Stress : Not at all  Social Connections: Moderately Integrated (06/07/2021)   Social Connection and Isolation Panel [NHANES]    Frequency of Communication with Friends and Family: Twice a week    Frequency of Social Gatherings with Friends and Family: Twice a week    Attends Religious Services: More than 4 times per year    Active Member of [employer] or Organizations: No    Attends Engineer, structural: Never    Marital Status: Married    Tobacco Counseling Counseling given: Not Answered Tobacco comments: smoked less than 1 ppd for 40+ years;    Clinical Intake:  Pre-visit preparation completed:  Yes  Pain : No/denies pain     Nutritional Status: BMI of 19-24  Normal Nutritional Risks: None Diabetes: No  How often do you need to have someone help you when you read instructions, pamphlets, or other written materials from your doctor or pharmacy?: 1 - Never  Diabetic? no  Interpreter Needed?: No  Information entered by :: NAllen LPN   Activities of Daily Living    06/15/2022    9:49 AM 01/18/2022    4:00 PM  In your present state of health, do you have any difficulty performing the following activities:  Hearing? 1 1  Vision? 0 0  Difficulty concentrating or making decisions? 1 1  Walking or climbing stairs? 1 0  Dressing or bathing? 0 1  Doing errands, shopping? 0 1  Preparing Food and eating ? N   Using the Toilet? N   In the past six months, have you accidently leaked urine? Y   Do you have problems with loss of bowel control? N   Managing your Medications? N   Managing your Finances? N   Housekeeping or managing your Housekeeping? N     Patient Care Team: Loyola Mast, MD as PCP - General (Family Medicine) Laurey Morale, MD as PCP - Cardiology (Cardiology) Laurey Morale, MD as PCP - Advanced Heart Failure (Cardiology) Meryl Dare, MD as Consulting Physician (Gastroenterology) Shirlean Kelly, MD as Consulting Physician (Neurosurgery) Lonie Peak, MD as Attending Physician (Radiation Oncology) Anabel Bene, RD as Dietitian (Nutrition) Jeanella Craze, Remi Deter, PT as Physical Therapist (Physical Therapy) Barron Alvine, CCC-SLP as Speech Language Pathologist (Speech Pathology) Ernestene Mention, LCSW as Social Worker Rene Paci, MD as Consulting Physician (Urology) Jodi Geralds, MD as Consulting Physician (Orthopedic Surgery) Maeola Harman, MD as Consulting Physician (Vascular Surgery) Genice Rouge, MD as Consulting Physician (Physical Medicine and Rehabilitation) Corey Skains, MD as Referring Physician  (Otolaryngology)  Indicate any recent Medical Services you may have received from other than [facility] providers in the past year (date may be approximate).     Assessment:   This is a routine wellness examination for Mohamad.  Hearing/Vision screen Vision Screening - Comments:: Regular eye exams, Dr.Lyles  Dietary issues and exercise activities discussed: Current Exercise Habits: The patient does not participate in regular exercise at present   Goals Addressed             This Visit's Progress    Patient Stated       06/15/2022, get older       Depression Screen    06/15/2022    9:49 AM 06/11/2022    2:09 PM 05/16/2022   10:56 AM 03/09/2022   10:15 AM 02/12/2022    1:16 PM 01/23/2022  2:56 PM 12/13/2021   10:14 AM  PHQ 2/9 Scores  PHQ - 2 Score 0 0 1 0 2 0 0    Fall Risk    06/15/2022    9:48 AM 06/11/2022    2:09 PM 05/16/2022   10:56 AM 03/09/2022   10:15 AM 02/12/2022    1:16 PM  Fall Risk   Falls in the past year? 0 0 0 1 0  Number falls in past yr: 0 0 0 0 0  Injury with Fall? 0 0 0 0 0  Risk for fall due to : Medication side effect No Fall Risks     Follow up Falls prevention discussed;Education provided;Falls evaluation completed Falls evaluation completed       FALL RISK PREVENTION PERTAINING TO THE HOME:  Any stairs in or around the home? Yes  If so, are there any without handrails? No  Home free of loose throw rugs in walkways, pet beds, electrical cords, etc? Yes  Adequate lighting in your home to reduce risk of falls? Yes   ASSISTIVE DEVICES UTILIZED TO PREVENT FALLS:  Life alert? No  Use of a cane, walker or w/c? No  Grab bars in the bathroom? Yes  Shower chair or bench in shower? No  Elevated toilet seat or a handicapped toilet? Yes   TIMED UP AND GO:  Was the test performed? No .      Cognitive Function:    05/30/2017   10:05 AM  MMSE - Mini Mental State Exam  Orientation to time 5  Orientation to Place 5  Registration 3  Attention/  Calculation 5  Recall 3  Language- name 2 objects 2  Language- repeat 1  Language- follow 3 step command 3  Language- read & follow direction 1  Write a sentence 1  Copy design 1  Total score 30        06/15/2022    9:51 AM  6CIT Screen  What Year? 0 points  What month? 0 points  What time? 0 points  Count back from 20 0 points  Months in reverse 4 points  Repeat phrase 6 points  Total Score 10 points    Immunizations Immunization History  Administered Date(s) Administered   Fluad Quad(high Dose 65+) 10/15/2018, 11/06/2019, 10/20/2020   Influenza Split 11/26/2010, 10/11/2011, 11/03/2012   Influenza Whole 11/14/2006, 12/04/2007, 10/06/2008, 10/25/2009, 11/26/2021   Influenza, High Dose Seasonal PF 11/01/2016, 10/30/2017   Influenza, Seasonal, Injecte, Preservative Fre 12/07/2014   Influenza,inj,Quad PF,6+ Mos 11/04/2015   Influenza-Unspecified 11/19/2013   PFIZER(Purple Top)SARS-COV-2 Vaccination 02/25/2019, 03/18/2019, 11/16/2019   Pneumococcal Conjugate-13 12/07/2014   Pneumococcal Polysaccharide-23 05/30/2017   Tdap 01/28/2020    TDAP status: Up to date  Flu Vaccine status: Up to date  Pneumococcal vaccine status: Up to date  Covid-19 vaccine status: Completed vaccines  Qualifies for Shingles Vaccine? Yes   Zostavax completed No   Shingrix Completed?: No.    Education has been provided regarding the importance of this vaccine. Patient has been advised to call insurance company to determine out of pocket expense if they have not yet received this vaccine. Advised may also receive vaccine at local pharmacy or Health Dept. Verbalized acceptance and understanding.  Screening Tests Health Maintenance  Topic Date Due   Zoster Vaccines- Shingrix (1 of 2) Never done   COLONOSCOPY (Pts 45-64yrs Insurance coverage will need to be confirmed)  03/22/2020   COVID-19 Vaccine (4 - 2023-24 season) 10/06/2021   Medicare Annual Wellness (AWV)  06/08/2022  Lung Cancer  Screening  08/18/2022   INFLUENZA VACCINE  09/06/2022   DTaP/Tdap/Td (2 - Td or Tdap) 01/27/2030   Pneumonia Vaccine 70+ Years old  Completed   Hepatitis C Screening  Completed   HPV VACCINES  Aged Out    Health Maintenance  Health Maintenance Due  Topic Date Due   Zoster Vaccines- Shingrix (1 of 2) Never done   COLONOSCOPY (Pts 45-38yrs Insurance coverage will need to be confirmed)  03/22/2020   COVID-19 Vaccine (4 - 2023-24 season) 10/06/2021   Medicare Annual Wellness (AWV)  06/08/2022    Colorectal cancer screening: Type of screening: Cologuard. Completed 02/05/2022. Repeat every 3 years  Lung Cancer Screening: (Low Dose CT Chest recommended if Age 50-80 years, 30 pack-year currently smoking OR have quit w/in 15years.) does qualify.   Lung Cancer Screening Referral: CT scan 08/17/2021  Additional Screening:  Hepatitis C Screening: does qualify; Completed 01/11/2020  Vision Screening: Recommended annual ophthalmology exams for early detection of glaucoma and other disorders of the eye. Is the patient up to date with their annual eye exam?  Yes  Who is the provider or what is the name of the office in which the patient attends annual eye exams? Dr. Randon Goldsmith If pt is not established with a provider, would they like to be referred to a provider to establish care? No .   Dental Screening: Recommended annual dental exams for proper oral hygiene  Community Resource Referral / Chronic Care Management: CRR required this visit?  No   CCM required this visit?  No      Plan:     I have personally reviewed and noted the following in the patient's chart:   Medical and social history Use of alcohol, tobacco or illicit drugs  Current medications and supplements including opioid prescriptions. Patient is currently taking opioid prescriptions. Information provided to patient regarding non-opioid alternatives. Patient advised to discuss non-opioid treatment plan with their  provider. Functional ability and status Nutritional status Physical activity Advanced directives List of other physicians Hospitalizations, surgeries, and ER visits in previous 12 months Vitals Screenings to include cognitive, depression, and falls Referrals and appointments  In addition, I have reviewed and discussed with patient certain preventive protocols, quality metrics, and best practice recommendations. A written personalized care plan for preventive services as well as general preventive health recommendations were provided to patient.     Barb Merino, LPN   1/61/0960   Nurse Notes: none  Due to this being a virtual visit, the after visit summary with patients personalized plan was offered to patient via mail or my-chart. Patient would like to access on my-chart

## 2022-06-15 NOTE — Patient Instructions (Signed)
Brandon [name] , Thank you for taking time to come for your Medicare Wellness Visit. I appreciate your ongoing commitment to your health goals. Please review the following plan we discussed and let me know if I can assist you in the future.   These are the goals we discussed:  Goals       <enter goal here> (pt-stated)      Maintains current health and activity level.       Patient Stated      06/15/2022, get older      PharmD Care Plan      CARE PLAN ENTRY  Current Barriers:  Chronic Disease Management support, education, and care coordination needs related to Hypertension, Hyperlipidemia, Gastroesophageal Reflux Disease, and pain/osteoarthritis.   Hypertension Pharmacist Clinical Goal(s): Over the next 180 days, patient will work with PharmD and providers to maintain BP goal <140/90 Current regimen:  Amlodipine 10 mg daily  Lisinopril 10 mg daily in morning Metoprolol tartrate 12.5 mg BID Isosorbide mononitrate ER 30 mg daily Interventions: Monitoring plan - home monitoring at least once weekly  Patient self care activities - Over the next 180 days, patient will: Check BP at home once weekly, document, and provide at future appointments Ensure daily salt intake < 2300 mg/day  Hyperlipidemia Pharmacist Clinical Goal(s): Over the next 180 days, patient will work with PharmD and providers to maintain LDL goal < 70 Current regimen:  Atorvastatin 40 mg daily  Interventions: Continue current management. Patient self care activities - Over the next 180 days, patient will: Continue current management.   Pain Pharmacist Clinical Goal(s) Over the next 180 days, patient will work with PharmD and providers to minimize recurring pain.  Current regimen:  Oxycodone 5 mg every 6 hours as needed Tramadol 100 mg every 6 hours as needed Gabapentin 300 mg every 8 hours  Voltaren gel as needed Interventions: Continue current management. Patient self care activities - Over the next 180 days,  patient will: Continue current management.  GERD Pharmacist Clinical Goal(s) Over the next 180 days, patient will work with PharmD and providers to minimize any recurring symptoms of acid reflux Current regimen:  Omeprazole 20 mg once daily  Interventions: No medication changes, continue avoidance of triggers including spicy foods  Patient self care activities - Over the next 180 days, patient will: Continue current management.    Medication management Pharmacist Clinical Goal(s): Over the next 180 days, patient will work with PharmD and providers to maintain optimal medication adherence Current pharmacy: [facility] (local and mail order) Interventions Comprehensive medication review performed. Continue current medication management strategy. Patient self care activities - Over the next 180 days, patient will: Focus on medication adherence by continuing current management Take medications as prescribed Report any questions or concerns to PharmD and/or provider(s) Initial goal documentation.        This is a list of the screening recommended for you and due dates:  Health Maintenance  Topic Date Due   Zoster (Shingles) Vaccine (1 of 2) Never done   Colon Cancer Screening  03/22/2020   COVID-19 Vaccine (4 - 2023-24 season) 10/06/2021   Screening for Lung Cancer  08/18/2022   Flu Shot  09/06/2022   Medicare Annual Wellness Visit  06/15/2023   DTaP/Tdap/Td vaccine (2 - Td or Tdap) 01/27/2030   Pneumonia Vaccine  Completed   Hepatitis C Screening: USPSTF Recommendation to screen - Ages 65-79 yo.  Completed   HPV Vaccine  Aged Out    Advanced directives: copy in  chart  Conditions/risks identified: none  Next appointment: Follow up in one year for your annual wellness visit.   Preventive Care 26 Years and Older, Male  Preventive care refers to lifestyle choices and visits with your health care provider that can promote health and wellness. What does preventive care  include? A yearly physical exam. This is also called an annual well check. Dental exams once or twice a year. Routine eye exams. Ask your health care provider how often you should have your eyes checked. Personal lifestyle choices, including: Daily care of your teeth and gums. Regular physical activity. Eating a healthy diet. Avoiding tobacco and drug use. Limiting alcohol use. Practicing safe sex. Taking low doses of aspirin every day. Taking vitamin and mineral supplements as recommended by your health care provider. What happens during an annual well check? The services and screenings done by your health care provider during your annual well check will depend on your age, overall health, lifestyle risk factors, and family history of disease. Counseling  Your health care provider may ask you questions about your: Alcohol use. Tobacco use. Drug use. Emotional well-being. Home and relationship well-being. Sexual activity. Eating habits. History of falls. Memory and ability to understand (cognition). Work and work Astronomer. Screening  You may have the following tests or measurements: Height, weight, and BMI. Blood pressure. Lipid and cholesterol levels. These may be checked every 5 years, or more frequently if you are over 48 years old. Skin check. Lung cancer screening. You may have this screening every year starting at age 36 if you have a 30-pack-year history of smoking and currently smoke or have quit within the past 15 years. Fecal occult blood test (FOBT) of the stool. You may have this test every year starting at age 3. Flexible sigmoidoscopy or colonoscopy. You may have a sigmoidoscopy every 5 years or a colonoscopy every 10 years starting at age 78. Prostate cancer screening. Recommendations will vary depending on your family history and other risks. Hepatitis C blood test. Hepatitis B blood test. Sexually transmitted disease (STD) testing. Diabetes screening. This  is done by checking your blood sugar (glucose) after you have not eaten for a while (fasting). You may have this done every 1-3 years. Abdominal aortic aneurysm (AAA) screening. You may need this if you are a current or former smoker. Osteoporosis. You may be screened starting at age 45 if you are at high risk. Talk with your health care provider about your test results, treatment options, and if necessary, the need for more tests. Vaccines  Your health care provider may recommend certain vaccines, such as: Influenza vaccine. This is recommended every year. Tetanus, diphtheria, and acellular pertussis (Tdap, Td) vaccine. You may need a Td booster every 10 years. Zoster vaccine. You may need this after age 42. Pneumococcal 13-valent conjugate (PCV13) vaccine. One dose is recommended after age 54. Pneumococcal polysaccharide (PPSV23) vaccine. One dose is recommended after age 29. Talk to your health care provider about which screenings and vaccines you need and how often you need them. This information is not intended to replace advice given to you by your health care provider. Make sure you discuss any questions you have with your health care provider. Document Released: 02/18/2015 Document Revised: 10/12/2015 Document Reviewed: 11/23/2014 Elsevier Interactive Patient Education  2017 [employer].  Fall Prevention in the Home Falls can cause injuries. They can happen to people of all ages. There are many things you can do to make your home safe and to  help prevent falls. What can I do on the outside of my home? Regularly fix the edges of walkways and driveways and fix any cracks. Remove anything that might make you trip as you walk through a door, such as a raised step or threshold. Trim any bushes or trees on the path to your home. Use bright outdoor lighting. Clear any walking paths of anything that might make someone trip, such as rocks or tools. Regularly check to see if handrails are  loose or broken. Make sure that both sides of any steps have handrails. Any raised decks and porches should have guardrails on the edges. Have any leaves, snow, or ice cleared regularly. Use sand or salt on walking paths during winter. Clean up any spills in your garage right away. This includes oil or grease spills. What can I do in the bathroom? Use night lights. Install grab bars by the toilet and in the tub and shower. Do not use towel bars as grab bars. Use non-skid mats or decals in the tub or shower. If you need to sit down in the shower, use a plastic, non-slip stool. Keep the floor dry. Clean up any water that spills on the floor as soon as it happens. Remove soap buildup in the tub or shower regularly. Attach bath mats securely with double-sided non-slip rug tape. Do not have throw rugs and other things on the floor that can make you trip. What can I do in the bedroom? Use night lights. Make sure that you have a light by your bed that is easy to reach. Do not use any sheets or blankets that are too big for your bed. They should not hang down onto the floor. Have a firm chair that has side arms. You can use this for support while you get dressed. Do not have throw rugs and other things on the floor that can make you trip. What can I do in the kitchen? Clean up any spills right away. Avoid walking on wet floors. Keep items that you use a lot in easy-to-reach places. If you need to reach something above you, use a strong step stool that has a grab bar. Keep electrical cords out of the way. Do not use floor polish or wax that makes floors slippery. If you must use wax, use non-skid floor wax. Do not have throw rugs and other things on the floor that can make you trip. What can I do with my stairs? Do not leave any items on the stairs. Make sure that there are handrails on both sides of the stairs and use them. Fix handrails that are broken or loose. Make sure that handrails are as  long as the stairways. Check any carpeting to make sure that it is firmly attached to the stairs. Fix any carpet that is loose or worn. Avoid having throw rugs at the top or bottom of the stairs. If you do have throw rugs, attach them to the floor with carpet tape. Make sure that you have a light switch at the top of the stairs and the bottom of the stairs. If you do not have them, ask someone to add them for you. What else can I do to help prevent falls? Wear shoes that: Do not have high heels. Have rubber bottoms. Are comfortable and fit you well. Are closed at the toe. Do not wear sandals. If you use a stepladder: Make sure that it is fully opened. Do not climb a closed stepladder. Make sure  that both sides of the stepladder are locked into place. Ask someone to hold it for you, if possible. Clearly mark and make sure that you can see: Any grab bars or handrails. First and last steps. Where the edge of each step is. Use tools that help you move around (mobility aids) if they are needed. These include: Canes. Walkers. Scooters. Crutches. Turn on the lights when you go into a dark area. Replace any light bulbs as soon as they burn out. Set up your furniture so you have a clear path. Avoid moving your furniture around. If any of your floors are uneven, fix them. If there are any pets around you, be aware of where they are. Review your medicines with your doctor. Some medicines can make you feel dizzy. This can increase your chance of falling. Ask your doctor what other things that you can do to help prevent falls. This information is not intended to replace advice given to you by your health care provider. Make sure you discuss any questions you have with your health care provider. Document Released: 11/18/2008 Document Revised: 06/30/2015 Document Reviewed: 02/26/2014 Elsevier Interactive Patient Education  2017 [employer].

## 2022-06-26 ENCOUNTER — Encounter: Payer: Self-pay | Admitting: Family Medicine

## 2022-06-26 ENCOUNTER — Ambulatory Visit (INDEPENDENT_AMBULATORY_CARE_PROVIDER_SITE_OTHER): Payer: Medicare Other | Admitting: Family Medicine

## 2022-06-26 VITALS — BP 125/70 | HR 69 | Temp 97.8°F | Ht 68.0 in | Wt 148.4 lb

## 2022-06-26 DIAGNOSIS — D508 Other iron deficiency anemias: Secondary | ICD-10-CM | POA: Diagnosis not present

## 2022-06-26 DIAGNOSIS — M353 Polymyalgia rheumatica: Secondary | ICD-10-CM

## 2022-06-26 DIAGNOSIS — I10 Essential (primary) hypertension: Secondary | ICD-10-CM

## 2022-06-26 DIAGNOSIS — M159 Polyosteoarthritis, unspecified: Secondary | ICD-10-CM

## 2022-06-26 DIAGNOSIS — I5032 Chronic diastolic (congestive) heart failure: Secondary | ICD-10-CM | POA: Diagnosis not present

## 2022-06-26 DIAGNOSIS — M1A09X0 Idiopathic chronic gout, multiple sites, without tophus (tophi): Secondary | ICD-10-CM

## 2022-06-26 DIAGNOSIS — R634 Abnormal weight loss: Secondary | ICD-10-CM

## 2022-06-26 DIAGNOSIS — D649 Anemia, unspecified: Secondary | ICD-10-CM

## 2022-06-26 DIAGNOSIS — F418 Other specified anxiety disorders: Secondary | ICD-10-CM

## 2022-06-26 DIAGNOSIS — M19042 Primary osteoarthritis, left hand: Secondary | ICD-10-CM | POA: Insufficient documentation

## 2022-06-26 DIAGNOSIS — G4700 Insomnia, unspecified: Secondary | ICD-10-CM | POA: Insufficient documentation

## 2022-06-26 DIAGNOSIS — J449 Chronic obstructive pulmonary disease, unspecified: Secondary | ICD-10-CM | POA: Diagnosis not present

## 2022-06-26 LAB — COMPREHENSIVE METABOLIC PANEL
ALT: 17 U/L (ref 0–53)
AST: 14 U/L (ref 0–37)
Albumin: 3.4 g/dL — ABNORMAL LOW (ref 3.5–5.2)
Alkaline Phosphatase: 127 U/L — ABNORMAL HIGH (ref 39–117)
BUN: 21 mg/dL (ref 6–23)
CO2: 28 mEq/L (ref 19–32)
Calcium: 8.5 mg/dL (ref 8.4–10.5)
Chloride: 101 mEq/L (ref 96–112)
Creatinine, Ser: 0.82 mg/dL (ref 0.40–1.50)
GFR: 83.42 mL/min (ref >60.00)
Glucose, Bld: 115 mg/dL — ABNORMAL HIGH (ref 70–99)
Potassium: 3.6 mEq/L (ref 3.5–5.1)
Sodium: 138 mEq/L (ref 135–145)
Total Bilirubin: 0.5 mg/dL (ref 0.2–1.2)
Total Protein: 5.5 g/dL — ABNORMAL LOW (ref 6.0–8.3)

## 2022-06-26 LAB — CBC
HCT: 32 % — ABNORMAL LOW (ref 39.0–52.0)
Hemoglobin: 9.9 g/dL — ABNORMAL LOW (ref 13.0–17.0)
MCHC: 31 g/dL (ref 30.0–36.0)
MCV: 79.1 fl (ref 78.0–100.0)
Platelets: 141 10*3/uL — ABNORMAL LOW (ref 150.0–400.0)
RBC: 4.05 Mil/uL — ABNORMAL LOW (ref 4.22–5.81)
RDW: 16.6 % — ABNORMAL HIGH (ref 11.5–15.5)
WBC: 6.4 10*3/uL (ref 4.0–10.5)

## 2022-06-26 LAB — TSH: TSH: 3.25 u[IU]/mL (ref 0.35–5.50)

## 2022-06-26 MED ORDER — TRAZODONE HCL 50 MG PO TABS
25.0000 mg | ORAL_TABLET | Freq: Every evening | ORAL | 3 refills | Status: DC | PRN
Start: 2022-06-26 — End: 2023-01-08

## 2022-06-26 MED ORDER — ALBUTEROL SULFATE HFA 108 (90 BASE) MCG/ACT IN AERS
2.0000 | INHALATION_SPRAY | Freq: Four times a day (QID) | RESPIRATORY_TRACT | 2 refills | Status: DC | PRN
Start: 2022-06-26 — End: 2022-09-18

## 2022-06-26 NOTE — Assessment & Plan Note (Signed)
Symptoms consistent with gout flare in left hand, now resolved. Continue allopurinol 200mg  BID. Reinforced avoiding food triggers.

## 2022-06-26 NOTE — Assessment & Plan Note (Signed)
COPD is stable. I will renew his albuterol. Brandon [name] has stopped his Spiriva inhaler. This could be contributing to symptoms of fatigue.

## 2022-06-26 NOTE — Progress Notes (Signed)
[facility] [address] [place] [address] Dept: [phone] Dept Fax: [phone]  Office Visit  Subjective:    Patient ID: Brandon [name], male    DOB: 15-Jul-1940, 82 y.o..   MRN: [id number]  Chief Complaint  Patient presents with   Hand Pain    C/o having swelling/pain in LT hand.  Also not sleeping well and losing weight.     History of Present Illness:  Patient is in today with a chief complaint of episodic swelling and pain int he left hand. However, he also is complaining of multiple other issues, include fatigue, lack of energy, progressive weight loss, insomnia issues, poor appetite, and depressed mood.  Related to the hand, he notes that this was swollen yesterday, but that the swellign went down over night. He has a history of osteoarthritis and gout. He has assumed that the swelling is related to gout. He is not sure of the potential food triggers for gout. He also has a history of PMR. He is on daily prednisone for this.  Mr. Taniguchi has a history of COPD, chronic diastolic heart failure, coronary artery disease, prior strokes, and prior tonsillar cancer. He feels that his appetite is poor at times.  Past Medical History: Patient Active Problem List   Diagnosis Date Noted   Chronic gout of multiple sites 04/30/2022   Acute exacerbation of chronic bronchitis (HCC) 04/03/2022   Chronically on opiate therapy 02/12/2022   Sacroiliac joint pain 02/12/2022   At risk for polypharmacy 01/19/2022   Aspiration pneumonia (HCC) 01/18/2022   Trochanteric bursitis of left hip 01/02/2022   Dysfunction of left eustachian tube 12/05/2021   Hypotension 08/18/2021   AKI (acute kidney injury) (HCC) 08/17/2021   Hyponatremia 06/26/2021   Hyperkalemia 06/26/2021   Acute metabolic encephalopathy 06/26/2021   Myoclonic jerking 06/26/2021   Lumbar radiculopathy 06/08/2021   Lumbar postlaminectomy syndrome 05/03/2021   Degeneration  of lumbar intervertebral disc 05/03/2021   Lumbar spondylosis 05/03/2021   History of hip fracture 04/24/2021   S/P abdominal aortic aneurysm repair 04/24/2021   Chronic allergic rhinitis 01/24/2021   Demand ischemia    Acute respiratory failure with hypoxia (HCC)    Elevated troponin    Chronic diastolic heart failure (HCC) 12/03/2020   Steatosis of liver 11/01/2020   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   Hepatitis B core antibody positive 11/01/2020   Benign prostatic hyperplasia 11/01/2020   Chronic obstructive pulmonary disease (HCC) 07/20/2020   History of placement of stent in LAD coronary artery 07/20/2020   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   TIA (transient ischemic attack)    Esophageal stricture    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   Primary osteoarthritis of both hands 01/11/2020   Piriformis syndrome of both sides 09/23/2019   Pain of both shoulder joints 07/29/2019   Sciatica associated with disorder of lumbar spine 04/24/2019   Primary osteoarthritis involving multiple joints 04/24/2019   Chronic pain syndrome 04/24/2019   Mixed anxiety and depressive disorder 04/15/2018   Oropharyngeal dysphagia 03/11/2018   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   Liver abscess 07/10/2016   Normocytic anemia 07/10/2016   Essential hypertension 05/19/2013   Psoriasis 01/13/2009   Coronary artery disease 09/15/2008   Diverticulosis of colon 08/27/2007   History of benign prostatic hyperplasia 08/27/2007   Hyperlipidemia 03/19/2007   Gastroesophageal reflux disease 03/19/2007   Past Surgical History:  Procedure Laterality Date   ABDOMINAL AORTIC ENDOVASCULAR STENT GRAFT  N/A 03/03/2021   Procedure: ABDOMINAL AORTIC ENDOVASCULAR STENT GRAFT;  Surgeon: Maeola Harman, MD;  Location: [facility] OR;  Service: Vascular;  Laterality: N/A;   ABDOMINAL AORTOGRAM W/LOWER EXTREMITY N/A 04/10/2021   Procedure: ABDOMINAL AORTOGRAM W/LOWER  EXTREMITY;  Surgeon: Maeola Harman, MD;  Location: [facility] INVASIVE CV LAB;  Service: Cardiovascular;  Laterality: N/A;   BACK SURGERY     CARDIAC CATHETERIZATION     CATARACT EXTRACTION W/ INTRAOCULAR LENS  IMPLANT, BILATERAL Bilateral 01/2012 - 02/2012   CORONARY ATHERECTOMY N/A 04/27/2020   Procedure: CORONARY ATHERECTOMY;  Surgeon: Tonny Bollman, MD;  Location: [facility] INVASIVE CV LAB;  Service: Cardiovascular;  Laterality: N/A;   CORONARY IMAGING/OCT N/A 04/27/2020   Procedure: INTRAVASCULAR IMAGING/OCT;  Surgeon: Tonny Bollman, MD;  Location: [facility] INVASIVE CV LAB;  Service: Cardiovascular;  Laterality: N/A;   CORONARY PRESSURE/FFR STUDY N/A 04/27/2020   Procedure: INTRAVASCULAR PRESSURE WIRE/FFR STUDY;  Surgeon: Tonny Bollman, MD;  Location: [facility] INVASIVE CV LAB;  Service: Cardiovascular;  Laterality: N/A;   CORONARY STENT INTERVENTION N/A 04/27/2020   Procedure: CORONARY STENT INTERVENTION;  Surgeon: Tonny Bollman, MD;  Location: [facility] INVASIVE CV LAB;  Service: Cardiovascular;  Laterality: N/A;   DIRECT LARYNGOSCOPY Left 10/09/2017   Procedure: DIRECT LARYNGOSCOPY WITH BOPSY;  Surgeon: Flo Shanks, MD;  Location: [facility];  Service: ENT;  Laterality: Left;   ESOPHAGOGASTRODUODENOSCOPY (EGD) WITH ESOPHAGEAL DILATION     "couple times" (07/19/2016)   ESOPHAGOSCOPY Left 10/09/2017   Procedure: ESOPHAGOSCOPY;  Surgeon: Flo Shanks, MD;  Location: [facility];  Service: ENT;  Laterality: Left;   IR GASTROSTOMY TUBE MOD SED  01/08/2018   IR THORACENTESIS ASP PLEURAL SPACE W/IMG GUIDE  07/19/2016   LAPAROSCOPIC CHOLECYSTECTOMY  1984   LUMBAR DISC SURGERY  05/1996   L4-5; Dr. Rosanne Sack LAMINECTOMY/DECOMPRESSION MICRODISCECTOMY  10/2002   L3-4. Dr. Newell Coral   MULTIPLE TOOTH EXTRACTIONS  1980s   PARTIAL GLOSSECTOMY  12/02/2017   Dr. Hezzie Bump- [facility]   pharyngoplasty for closure of tingue base defect  12/02/2017   Dr. Hezzie Bump- [facility]   PICC LINE  INSERTION  07/15/2016   POSTERIOR LUMBAR FUSION  09/1996   Ray cage, L4-5 Dr. Jule Ser   PROSTATE BIOPSY  ~ 2017   radical tonsillectomy Left 12/02/2017   Dr. [facility]zzie Bump at [facility]   RIGHT HEART CATH AND CORONARY ANGIOGRAPHY N/A 03/31/2020   Procedure: RIGHT HEART CATH AND CORONARY ANGIOGRAPHY;  Surgeon: Laurey Morale, MD;  Location: [facility] INVASIVE CV LAB;  Service: Cardiovascular;  Laterality: N/A;   RIGID BRONCHOSCOPY Left 10/09/2017   Procedure: RIGID BRONCHOSCOPY;  Surgeon: Flo Shanks, MD;  Location: [facility];  Service: ENT;  Laterality: Left;   TEE WITHOUT CARDIOVERSION N/A 06/07/2020   Procedure: TRANSESOPHAGEAL ECHOCARDIOGRAM (TEE);  Surgeon: Tonny Bollman, MD;  Location: [facility] OR;  Service: Open Heart Surgery;  Laterality: N/A;   TONSILLECTOMY     TOTAL HIP ARTHROPLASTY Left 12/04/2020   Procedure: TOTAL HIP ARTHROPLASTY ANTERIOR APPROACH;  Surgeon: Samson Frederic, MD;  Location: [facility] ORS;  Service: Orthopedics;  Laterality: Left;   TOTAL HIP ARTHROPLASTY Left 11/2020   TRACHEOSTOMY  12/02/2017   Dr. Hezzie Bump- [facility]   ULTRASOUND GUIDANCE FOR VASCULAR ACCESS Right 06/07/2020   Procedure: ULTRASOUND GUIDANCE FOR VASCULAR ACCESS;  Surgeon: Tonny Bollman, MD;  Location: [facility] OR;  Service: Open Heart Surgery;  Laterality: Right;   ULTRASOUND GUIDANCE FOR VASCULAR ACCESS Bilateral 03/03/2021   Procedure: ULTRASOUND GUIDANCE FOR VASCULAR ACCESS, BILATERAL FEMORAL ARTERIES;  Surgeon: Maeola Harman, MD;  Location: [facility] OR;  Service: Vascular;  Laterality: Bilateral;   VASCULAR SURGERY     Family History  Problem Relation Age of Onset   Heart disease Father 80       Living   Coronary artery disease Father        CABG   Alzheimer's disease Mother 28       Deceased   Arthritis Mother    Aneurysm Brother    Stomach cancer Maternal Uncle    Brain cancer Maternal Aunt        x2   Obesity Daughter        Had Bypass Sx   Outpatient Medications Prior to Visit   Medication Sig Dispense Refill   acetaminophen (TYLENOL) 500 MG tablet Take 1,000 mg by mouth every 8 (eight) hours as needed for mild pain or headache.     allopurinol (ZYLOPRIM) 100 MG tablet TAKE 2 TABLETS(200 MG) BY MOUTH DAILY 180 tablet 0   amLODipine (NORVASC) 5 MG tablet Take 1 tablet (5 mg total) by mouth daily. 90 tablet 3   aspirin EC 81 MG tablet Take 81 mg by mouth at bedtime. Swallow whole.     atorvastatin (LIPITOR) 80 MG tablet TAKE 1 TABLET(80 MG) BY MOUTH DAILY 90 tablet 3   carvedilol (COREG) 6.25 MG tablet Take 1 tablet (6.25 mg total) by mouth 2 (two) times daily with a meal. 60 tablet 3   citalopram (CELEXA) 40 MG tablet Take 1 tablet (40 mg total) by mouth daily. 30 tablet 5   ezetimibe (ZETIA) 10 MG tablet Take 1 tablet (10 mg total) by mouth daily. NEEDS FOLLOW UP APPOINTMENT FOR MORE REFILLS 60 tablet 0   FLUoxetine (PROZAC) 40 MG capsule Take 1 capsule (40 mg total) by mouth daily. 30 capsule 5   fluticasone (FLONASE) 50 MCG/ACT nasal spray SHAKE LIQUID AND USE 2 SPRAYS IN EACH NOSTRIL DAILY AS NEEDED FOR RHINITIS OR ALLERGIES (Patient taking differently: Place 2 sprays into both nostrils daily.) 16 g 11   gabapentin (NEURONTIN) 800 MG tablet Take 1 tablet (800 mg total) by mouth 3 (three) times daily. Can increase to 4x/day if needed after 3 days- for nerve pain (Patient taking differently: Take 800 mg by mouth 3 (three) times daily.) 120 tablet 5   guaiFENesin (MUCINEX) 600 MG 12 hr tablet Take 600 mg by mouth 2 (two) times daily as needed for cough or to loosen phlegm.     hydrOXYzine (ATARAX) 10 MG tablet Take 1-2 tablets (10-20 mg total) by mouth at bedtime as needed for itching (or sleep). 30 tablet 3   ipratropium (ATROVENT) 0.02 % nebulizer solution Use 1 vial (0.5 mg total) by nebulization every 6 (six) hours as needed for wheezing or shortness of breath. 75 mL 12   ipratropium (ATROVENT) 0.03 % nasal spray Place 2 sprays into both nostrils every 12 (twelve)  hours. 30 mL 12   levalbuterol (XOPENEX) 0.63 MG/3ML nebulizer solution Use 1 vial (0.63 mg total) by nebulization every 6 (six) hours as needed for wheezing or shortness of breath. 75 mL 12   morphine (MS CONTIN) 15 MG 12 hr tablet Take 1 tablet (15 mg total) by mouth every 12 (twelve) hours. 60 tablet 0   morphine (MS CONTIN) 15 MG 12 hr tablet Take 1 tablet (15 mg total) by mouth every 12 (twelve) hours. Fill 06/19/22 60 tablet 0   naloxone (NARCAN) nasal spray 4 mg/0.1 mL For possible accidentally overdose.  2 each 5   oxyCODONE 10 MG TABS Take 1 tablet (10 mg total) by mouth every 4 (four) hours as needed for severe pain. Fill 5/14 150 tablet 0   Oxycodone HCl 10 MG TABS Take 1 tablet (10 mg total) by mouth every 6 (six) hours as needed (severe pain). for chronic pain 150 tablet 0   pantoprazole (PROTONIX) 40 MG tablet Take 1 tablet (40 mg total) by mouth daily. (Patient taking differently: Take 40 mg by mouth daily as needed (for heartburn).) 30 tablet 2   potassium chloride (KLOR-CON) 10 MEQ tablet Take 10 mEq by mouth 4 (four) times daily.     predniSONE (DELTASONE) 1 MG tablet Take 2 tablets (2 mg total) by mouth daily with breakfast. Take in addition to 5 mg tablet to equal 7 mg. Taper by 1 mg every 2 months. 120 tablet 0   predniSONE (DELTASONE) 5 MG tablet TAKE 1 TABLET(5 MG) BY MOUTH DAILY WITH BREAKFAST 90 tablet 0   tamsulosin (FLOMAX) 0.4 MG CAPS capsule Take 0.4 mg by mouth See admin instructions. Take 0.4 mg by mouth one to two times a day     Tiotropium Bromide Monohydrate (SPIRIVA RESPIMAT) 2.5 MCG/ACT AERS Inhale 2 puffs into the lungs daily. 4 g 6   triamcinolone cream (KENALOG) 0.1 % Apply 1 Application topically 2 (two) times daily as needed (to itchy sites).     trolamine salicylate (ASPERCREME) 10 % cream Apply 1 Application topically 2 (two) times daily as needed (arthritis pain).     albuterol (VENTOLIN HFA) 108 (90 Base) MCG/ACT inhaler Inhale 2 puffs into the lungs every 6  (six) hours as needed for wheezing or shortness of breath. 6.7 g 2   ferrous sulfate 325 (65 FE) MG EC tablet Take 1 tablet (325 mg total) by mouth daily with breakfast. (Patient not taking: Reported on 06/26/2022) 30 tablet 0   furosemide (LASIX) 20 MG tablet Take 1 tablet (20 mg total) by mouth daily. 90 tablet 3   traMADol (ULTRAM) 50 MG tablet Take 2 tablets (100 mg total) by mouth every 8 (eight) hours as needed for moderate pain. (Patient not taking: Reported on 06/26/2022) 120 tablet 5   No facility-administered medications prior to visit.   Allergies  Allergen Reactions   Tape Other (See Comments)    Tears skin. Prefers paper tape, Please   Celebrex [Celecoxib] Hives and Itching     Objective:   Today's Vitals   06/26/22 0753  BP: 125/70  Pulse: 69  Temp: 97.8 F (36.6 C)  TempSrc: Temporal  SpO2: 96%  Weight: 148 lb 6.4 oz (67.3 kg)  Height: 5\' 8"  (1.727 m)   Body mass index is 22.56 kg/m.   General: Well developed, well nourished. No acute distress. HEENT: Normocephalic, non-traumatic. Mucous membranes moist. Oropharynx clear. Neck: Supple. No lymphadenopathy. No thyromegaly. Lungs: Coarse bibasilar rhonchi. No wheezing or rales. CV: RRR without murmurs or rubs. Pulses 2+ bilaterally. Abdomen: Soft, nontender. No masses. Extremities: No edema noted. Psych: Alert and oriented. Normal mood and affect.  Health Maintenance Due  Topic Date Due   Zoster Vaccines- Shingrix (1 of 2) Never done   COLONOSCOPY (Pts 45-72yrs Insurance coverage will need to be confirmed)  03/22/2020     Assessment & Plan:   Problem List Items Addressed This Visit       Cardiovascular and Mediastinum   Essential hypertension    Blood pressure is in good control. Continue amlodipine 5 mg daily and carvedilol 6.25  mg twice daily.      Relevant Orders   Comprehensive metabolic panel (Completed)   Chronic diastolic heart failure (HCC)    Compensated, though this could contribute to some  of his feelings of fatigue. Continue carvedilol 6.25 mg twice a day and furosemide 20 mg daily.        Respiratory   Chronic obstructive pulmonary disease (HCC)    COPD is stable. I will renew his albuterol. Mr. Reber has stopped his Spiriva inhaler. This could be contributing to symptoms of fatigue.      Relevant Medications   albuterol (VENTOLIN HFA) 108 (90 Base) MCG/ACT inhaler     Musculoskeletal and Integument   Primary osteoarthritis involving multiple joints    Diffuse arthritis issues do contribute to limitations on physical functioning.       Chronic gout of multiple sites    Symptoms consistent with gout flare in left hand, now resolved. Continue allopurinol 200mg  BID. Reinforced avoiding food triggers.      Arthritis of left hand    Hand was recently evaluated by rheumatologist, who felt he had some osteoarthritis with some possible gout overlay. He was advised to avoid food triggers for his gout.        Other   Normocytic anemia    Anemia could be an underlying cause of his fatigue. I will reassess his CBC, iron panel, and check a reticulocyte count.       Relevant Orders   CBC (Completed)   Iron, TIBC and Ferritin Panel   Reticulocytes   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HCC)    Stable. Currently on prednisone 8 mg daily. Continue to follow with rheumatology.      Mixed anxiety and depressive disorder    Some of his fatigue likely relates to depression related to his chronic disease issues. I recommend he continue his citalopram use.      Relevant Medications   traZODone (DESYREL) 50 MG tablet   Insomnia    The etiology of his insomnia is likely multifactorial. I advised him to avoid caffeine later in the day/evening and to get light exercise early in the evening. We will give a trial of trazodone.      Relevant Medications   traZODone (DESYREL) 50 MG tablet   Weight loss - Primary    Mr. Calderon has had 38 lbs. of weight loss since Oct. 2022.  I do not see any  physical evidence for undiagnosed cancer. I will check labs to assess for possible underlying conditions.      Relevant Orders   Comprehensive metabolic panel (Completed)   TSH (Completed)    Return in about 4 weeks (around 07/24/2022) for Reassessment.   Loyola Mast, MD

## 2022-06-26 NOTE — Assessment & Plan Note (Signed)
Diffuse arthritis issues do contribute to limitations on physical functioning.

## 2022-06-26 NOTE — Assessment & Plan Note (Addendum)
Compensated, though this could contribute to some of his feelings of fatigue. Continue carvedilol 6.25 mg twice a day and furosemide 20 mg daily.

## 2022-06-26 NOTE — Assessment & Plan Note (Signed)
Stable. Currently on prednisone 8 mg daily. Continue to follow with rheumatology.

## 2022-06-26 NOTE — Assessment & Plan Note (Signed)
Brandon [name] has had 38 lbs. of weight loss since Oct. 2022.  I do not see any physical evidence for undiagnosed cancer. I will check labs to assess for possible underlying conditions.

## 2022-06-26 NOTE — Assessment & Plan Note (Signed)
The etiology of his insomnia is likely multifactorial. I advised him to avoid caffeine later in the day/evening and to get light exercise early in the evening. We will give a trial of trazodone.

## 2022-06-26 NOTE — Assessment & Plan Note (Signed)
Anemia could be an underlying cause of his fatigue. I will reassess his CBC, iron panel, and check a reticulocyte count.

## 2022-06-26 NOTE — Assessment & Plan Note (Signed)
Blood pressure is in good control. Continue amlodipine 5 mg daily and carvedilol 6.25 mg twice daily.

## 2022-06-26 NOTE — Assessment & Plan Note (Signed)
Hand was recently evaluated by rheumatologist, who felt he had some osteoarthritis with some possible gout overlay. He was advised to avoid food triggers for his gout.

## 2022-06-26 NOTE — Assessment & Plan Note (Signed)
Some of his fatigue likely relates to depression related to his chronic disease issues. I recommend he continue his citalopram use.

## 2022-06-27 LAB — IRON,TIBC AND FERRITIN PANEL
%SAT: 9 % (calc) — ABNORMAL LOW (ref 20–48)
Ferritin: 98 ng/mL (ref 24–380)
Iron: 25 ug/dL — ABNORMAL LOW (ref 50–180)
TIBC: 273 mcg/dL (calc) (ref 250–425)

## 2022-06-27 MED ORDER — IRON (FERROUS SULFATE) 325 (65 FE) MG PO TABS
325.0000 mg | ORAL_TABLET | Freq: Every day | ORAL | 3 refills | Status: DC
Start: 2022-06-27 — End: 2023-02-11

## 2022-06-27 NOTE — Addendum Note (Signed)
Addended by: Loyola Mast on: 06/27/2022 10:44 AM   Modules accepted: Orders

## 2022-06-28 ENCOUNTER — Telehealth: Payer: Self-pay

## 2022-06-28 MED ORDER — OXYCODONE HCL 10 MG PO TABS: 10.0000 mg | ORAL_TABLET | ORAL | 0 refills | Status: DC | PRN

## 2022-06-28 MED ORDER — MORPHINE SULFATE ER 15 MG PO TBCR: 15.0000 mg | EXTENDED_RELEASE_TABLET | Freq: Two times a day (BID) | ORAL | 0 refills | Status: DC

## 2022-06-28 NOTE — Telephone Encounter (Signed)
Patient request for refill of Oxycodone & Morphine.  PMP report:  Filled  Written  ID  Drug  QTY  Days  Prescriber  RX #  Dispenser  Refill  Daily Dose*  Pymt Type  PMP  05/30/2022 05/16/2022 1  Oxycodone Hcl (Ir) 10 Mg Tab 150.00 30 Me Lov [id number] [facility] ([id number]) 0/0 75.00 MME Medicare [place] 05/24/2022 05/16/2022 1  Morphine Sulf Er 15 Mg Tablet 60.00 30 Me Lov [id number] [facility] ([id number]) 0/0 30.00 MME Medicare [place] 04/23/2022 04/23/2022 1  Oxycodone Hcl (Ir) 10 Mg Tab 150.00 37 Me Lov [id number] [facility] ([id number]) 0/0 60.81 MME Medicare [place] 04/23/2022 04/23/2022 1  Morphine Sulf Er 15 Mg Tablet 60.00 30 Me Lov [id number] [facility] ([id number]) 0/0 30.00 MME Medicare [place]

## 2022-06-29 ENCOUNTER — Other Ambulatory Visit (HOSPITAL_COMMUNITY): Payer: Self-pay | Admitting: Cardiology

## 2022-07-09 NOTE — Progress Notes (Unsigned)
Office Visit Note  Patient: Brandon [name]             Date of Birth: 08-24-1940           MRN: [id number]             PCP: Loyola Mast, MD Referring: Loyola Mast, MD Visit Date: 07/10/2022 Occupation: @GUAROCC @  Subjective:  Generalized pain   History of Present Illness: Brandon [name] is a 82 y.o. male with history of polymyalgia rheumatica, osteoarthritis, and gout.  Patient reports that he is currently taking prednisone 5 mg daily.  He was last seen in the office on 05/23/2022 which time he was taking prednisone 8 mg daily and was advised to start tapering by 1 mg every 2 months.  He tapered the dose of prednisone more quickly and is currently having increased arthralgias and joint stiffness.  He is having swelling in the left hand and was having difficulty making a complete fist.  He remains under the care of Dr. Berline Chough for pain management but continues to have significant breakthrough symptoms.  His level of pain has been greatly interfering with his quality of life.  He has been having difficulty sleeping at night and was recently started on trazodone 50 mg at bedtime.  He has been taking Tylenol in the mornings for breakthrough pain.  Patient states that he was recently started on iron pills for anemia which is being followed by his PCP.    Activities of Daily Living:  Patient reports morning stiffness for all day. Patient Reports nocturnal pain.  Difficulty dressing/grooming: Denies Difficulty climbing stairs: Denies Difficulty getting out of chair: Denies Difficulty using hands for taps, buttons, cutlery, and/or writing: Denies  Review of Systems  Constitutional:  Positive for fatigue and weight loss.  HENT:  Positive for mouth dryness. Negative for mouth sores.   Eyes:  Negative for dryness.  Respiratory:  Negative for shortness of breath.   Cardiovascular:  Negative for chest pain and palpitations.  Gastrointestinal:  Positive for constipation. Negative for blood in  stool and diarrhea.  Endocrine: Negative for increased urination.  Genitourinary:  Negative for involuntary urination.  Musculoskeletal:  Positive for joint pain, joint pain, joint swelling, myalgias, morning stiffness, muscle tenderness and myalgias. Negative for gait problem and muscle weakness.  Skin:  Negative for color change, rash, hair loss and sensitivity to sunlight.  Allergic/Immunologic: Negative for susceptible to infections.  Neurological:  Negative for dizziness and headaches.  Hematological:  Positive for bruising/bleeding tendency. Negative for swollen glands.  Psychiatric/Behavioral:  Negative for depressed mood and sleep disturbance. The patient is not nervous/anxious.     PMFS History:  Patient Active Problem List   Diagnosis Date Noted   Insomnia 06/26/2022   Arthritis of left hand 06/26/2022   Weight loss 06/26/2022   Chronic gout of multiple sites 04/30/2022   Acute exacerbation of chronic bronchitis (HCC) 04/03/2022   Chronically on opiate therapy 02/12/2022   Sacroiliac joint pain 02/12/2022   At risk for polypharmacy 01/19/2022   Aspiration pneumonia (HCC) 01/18/2022   Trochanteric bursitis of left hip 01/02/2022   Dysfunction of left eustachian tube 12/05/2021   Hypotension 08/18/2021   AKI (acute kidney injury) (HCC) 08/17/2021   Hyponatremia 06/26/2021   Hyperkalemia 06/26/2021   Acute metabolic encephalopathy 06/26/2021   Myoclonic jerking 06/26/2021   Lumbar radiculopathy 06/08/2021   Lumbar postlaminectomy syndrome 05/03/2021   Degeneration of lumbar intervertebral disc 05/03/2021   Lumbar spondylosis 05/03/2021  History of hip fracture 04/24/2021   S/P abdominal aortic aneurysm repair 04/24/2021   Chronic allergic rhinitis 01/24/2021   Demand ischemia    Acute respiratory failure with hypoxia (HCC)    Elevated troponin    Chronic diastolic heart failure (HCC) 12/03/2020   Steatosis of liver 11/01/2020   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    Hepatitis B core antibody positive 11/01/2020   Benign prostatic hyperplasia 11/01/2020   Chronic obstructive pulmonary disease (HCC) 07/20/2020   History of placement of stent in LAD coronary artery 07/20/2020   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   TIA (transient ischemic attack)    Esophageal stricture    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   Primary osteoarthritis of both hands 01/11/2020   Piriformis syndrome of both sides 09/23/2019   Pain of both shoulder joints 07/29/2019   Sciatica associated with disorder of lumbar spine 04/24/2019   Primary osteoarthritis involving multiple joints 04/24/2019   Chronic pain syndrome 04/24/2019   Mixed anxiety and depressive disorder 04/15/2018   Oropharyngeal dysphagia 03/11/2018   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   Liver abscess 07/10/2016   Normocytic anemia 07/10/2016   Essential hypertension 05/19/2013   Psoriasis 01/13/2009   Coronary artery disease 09/15/2008   Diverticulosis of colon 08/27/2007   History of benign prostatic hyperplasia 08/27/2007   Hyperlipidemia 03/19/2007   Gastroesophageal reflux disease 03/19/2007    Past Medical History:  Diagnosis Date   Abdominal aneurysm (HCC)    Arthritis    "all over" (07/19/2016)   BPH (benign prostatic hypertrophy)    CAD (coronary artery disease)    Chicken pox    Chronic lower back pain    s/p surgical fusion   Depression    Diverticulosis    Esophageal stricture    GERD (gastroesophageal reflux disease)    Hepatitis B 1984   Hiatal hernia    History of radiation therapy 01/16/18- 03/05/18   Left Tonsil, 66 Gy in 33 fractions to high risk nodal echelons.    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 Excell Seltzer and Dr Laneta Simmers    Severe aortic  stenosis    TIA (transient ischemic attack) 1990s   hx   tonsillar ca dx'd 11/2017   Tubular adenoma of colon 2009    Family History  Problem Relation Age of Onset   Heart disease Father 46       Living   Coronary artery disease Father        CABG   Alzheimer's disease Mother 71       Deceased   Arthritis Mother    Aneurysm Brother    Stomach cancer Maternal Uncle    Brain cancer Maternal Aunt        x2   Obesity Daughter        Had Bypass Sx   Past Surgical History:  Procedure Laterality Date   ABDOMINAL AORTIC ENDOVASCULAR STENT GRAFT N/A 03/03/2021   Procedure: ABDOMINAL AORTIC ENDOVASCULAR STENT GRAFT;  Surgeon: Maeola Harman, MD;  Location: [facility] OR;  Service: Vascular;  Laterality: N/A;   ABDOMINAL AORTOGRAM W/LOWER EXTREMITY N/A 04/10/2021   Procedure: ABDOMINAL AORTOGRAM W/LOWER EXTREMITY;  Surgeon: Maeola Harman, MD;  Location: [facility] INVASIVE CV LAB;  Service: Cardiovascular;  Laterality: N/A;  BACK SURGERY     CARDIAC CATHETERIZATION     CATARACT EXTRACTION W/ INTRAOCULAR LENS  IMPLANT, BILATERAL Bilateral 01/2012 - 02/2012   CORONARY ATHERECTOMY N/A 04/27/2020   Procedure: CORONARY ATHERECTOMY;  Surgeon: Tonny Bollman, MD;  Location: [facility] INVASIVE CV LAB;  Service: Cardiovascular;  Laterality: N/A;   CORONARY IMAGING/OCT N/A 04/27/2020   Procedure: INTRAVASCULAR IMAGING/OCT;  Surgeon: Tonny Bollman, MD;  Location: [facility] INVASIVE CV LAB;  Service: Cardiovascular;  Laterality: N/A;   CORONARY PRESSURE/FFR STUDY N/A 04/27/2020   Procedure: INTRAVASCULAR PRESSURE WIRE/FFR STUDY;  Surgeon: Tonny Bollman, MD;  Location: [facility] INVASIVE CV LAB;  Service: Cardiovascular;  Laterality: N/A;   CORONARY STENT INTERVENTION N/A 04/27/2020   Procedure: CORONARY STENT INTERVENTION;  Surgeon: Tonny Bollman, MD;  Location: [facility] INVASIVE CV LAB;  Service: Cardiovascular;  Laterality: N/A;   DIRECT LARYNGOSCOPY Left 10/09/2017   Procedure: DIRECT LARYNGOSCOPY WITH BOPSY;   Surgeon: Flo Shanks, MD;  Location: [facility];  Service: ENT;  Laterality: Left;   ESOPHAGOGASTRODUODENOSCOPY (EGD) WITH ESOPHAGEAL DILATION     "couple times" (07/19/2016)   ESOPHAGOSCOPY Left 10/09/2017   Procedure: ESOPHAGOSCOPY;  Surgeon: Flo Shanks, MD;  Location: [facility];  Service: ENT;  Laterality: Left;   IR GASTROSTOMY TUBE MOD SED  01/08/2018   IR THORACENTESIS ASP PLEURAL SPACE W/IMG GUIDE  07/19/2016   LAPAROSCOPIC CHOLECYSTECTOMY  1984   LUMBAR DISC SURGERY  05/1996   L4-5; Dr. Rosanne Sack LAMINECTOMY/DECOMPRESSION MICRODISCECTOMY  10/2002   L3-4. Dr. Newell Coral   MULTIPLE TOOTH EXTRACTIONS  1980s   PARTIAL GLOSSECTOMY  12/02/2017   Dr. Hezzie Bump- [facility]   pharyngoplasty for closure of tingue base defect  12/02/2017   Dr. Hezzie Bump- [facility]   PICC LINE INSERTION  07/15/2016   POSTERIOR LUMBAR FUSION  09/1996   Ray cage, L4-5 Dr. Jule Ser   PROSTATE BIOPSY  ~ 2017   radical tonsillectomy Left 12/02/2017   Dr. Hezzie Bump at [facility]   RIGHT HEART CATH AND CORONARY ANGIOGRAPHY N/A 03/31/2020   Procedure: RIGHT HEART CATH AND CORONARY ANGIOGRAPHY;  Surgeon: Laurey Morale, MD;  Location: [facility] INVASIVE CV LAB;  Service: Cardiovascular;  Laterality: N/A;   RIGID BRONCHOSCOPY Left 10/09/2017   Procedure: RIGID BRONCHOSCOPY;  Surgeon: Flo Shanks, MD;  Location:  SURGERY CENTER;  Service: ENT;  Laterality: Left;   TEE WITHOUT CARDIOVERSION N/A 06/07/2020   Procedure: TRANSESOPHAGEAL ECHOCARDIOGRAM (TEE);  Surgeon: Tonny Bollman, MD;  Location: [facility] OR;  Service: Open Heart Surgery;  Laterality: N/A;   TONSILLECTOMY     TOTAL HIP ARTHROPLASTY Left 12/04/2020   Procedure: TOTAL HIP ARTHROPLASTY ANTERIOR APPROACH;  Surgeon: Samson Frederic, MD;  Location: [facility] ORS;  Service: Orthopedics;  Laterality: Left;   TOTAL HIP ARTHROPLASTY Left 11/2020   TRACHEOSTOMY  12/02/2017   Dr. Hezzie Bump- [facility]   ULTRASOUND GUIDANCE FOR VASCULAR ACCESS  Right 06/07/2020   Procedure: ULTRASOUND GUIDANCE FOR VASCULAR ACCESS;  Surgeon: Tonny Bollman, MD;  Location: [facility] OR;  Service: Open Heart Surgery;  Laterality: Right;   ULTRASOUND GUIDANCE FOR VASCULAR ACCESS Bilateral 03/03/2021   Procedure: ULTRASOUND GUIDANCE FOR VASCULAR ACCESS, BILATERAL FEMORAL ARTERIES;  Surgeon: Maeola Harman, MD;  Location: [facility] OR;  Service: Vascular;  Laterality: Bilateral;   VASCULAR SURGERY     Social History   Social History Narrative   ** Merged History Encounter **       Married (3rd), [place]. 2 children from 1st marriage, 4 step children.    Retired on disability  due to back    Former Runner, broadcasting/film/video.   restores antique furniture for a hobby.       Cell # [phone]   Immunization History  Administered Date(s) Administered   Fluad Quad(high Dose 65+) 10/15/2018, 11/06/2019, 10/20/2020   Influenza Split 11/26/2010, 10/11/2011, 11/03/2012   Influenza Whole 11/14/2006, 12/04/2007, 10/06/2008, 10/25/2009, 11/26/2021   Influenza, High Dose Seasonal PF 11/01/2016, 10/30/2017   Influenza, Seasonal, Injecte, Preservative Fre 12/07/2014   Influenza,inj,Quad PF,6+ Mos 11/04/2015   Influenza-Unspecified 11/19/2013   PFIZER(Purple Top)SARS-COV-2 Vaccination 02/25/2019, 03/18/2019, 11/16/2019   Pneumococcal Conjugate-13 12/07/2014   Pneumococcal Polysaccharide-23 05/30/2017   Tdap 01/28/2020     Objective: Vital Signs: BP 119/66 (BP Location: Right Arm, Patient Position: Sitting, Cuff Size: Normal)   Pulse 65   Resp 17   Ht 5\' 8"  (1.727 m)   Wt 147 lb (66.7 kg)   BMI 22.35 kg/m    Physical Exam Vitals and nursing note reviewed.  Constitutional:      Appearance: He is well-developed.  HENT:     Head: Normocephalic and atraumatic.  Eyes:     Conjunctiva/sclera: Conjunctivae normal.     Pupils: Pupils are equal, round, and reactive to light.  Cardiovascular:     Rate and Rhythm: Normal rate and regular rhythm.      Heart sounds: Normal heart sounds.  Pulmonary:     Effort: Pulmonary effort is normal.     Breath sounds: Normal breath sounds.  Abdominal:     General: Bowel sounds are normal.     Palpations: Abdomen is soft.  Musculoskeletal:     Cervical back: Normal range of motion and neck supple.  Skin:    General: Skin is warm and dry.     Capillary Refill: Capillary refill takes less than 2 seconds.  Neurological:     Mental Status: He is alert and oriented to person, place, and time.  Psychiatric:        Behavior: Behavior normal.      Musculoskeletal Exam: C-spine has limited ROM with discomfort.  Thoracic kyphosis.  Painful ROM of the left shoulder.  Right elbow has good ROM.  Left elbow in a brace currently.  Diffuse swelling on the dorsal aspect of the left wrist.  PIP and DIP thickening.  CMC joint prominence and subluxation bilaterally. Knee joints have good ROM with no warmth or effusion.  Ankle joints have good ROM with no tenderness or joint swelling.   CDAI Exam: CDAI Score: -- Patient Global: --; Provider Global: -- Swollen: --; Tender: -- Joint Exam 07/10/2022   No joint exam has been documented for this visit   There is currently no information documented on the homunculus. Go to the Rheumatology activity and complete the homunculus joint exam.  Investigation: No additional findings.  Imaging: No results found.  Recent Labs: Lab Results  Component Value Date   WBC 6.4 06/26/2022   HGB 9.9 (L) 06/26/2022   PLT 141.0 (L) 06/26/2022   NA 138 06/26/2022   K 3.6 06/26/2022   CL 101 06/26/2022   CO2 28 06/26/2022   GLUCOSE 115 (H) 06/26/2022   BUN 21 06/26/2022   CREATININE 0.82 06/26/2022   BILITOT 0.5 06/26/2022   ALKPHOS 127 (H) 06/26/2022   AST 14 06/26/2022   ALT 17 06/26/2022   PROT 5.5 (L) 06/26/2022   ALBUMIN 3.4 (L) 06/26/2022   CALCIUM 8.5 06/26/2022   GFRAA 107 08/11/2020   QFTBGOLDPLUS NEGATIVE 01/11/2020    Speciality  Comments:  Fosamax  started on January 26, 2021.  If he has dysphagia with Fosamax or any GI intolerance we will have to switch him to Reclast.  Procedures:  No procedures performed Allergies: Tape and Celebrex [celecoxib]    Assessment / Plan:     Visit Diagnoses: Polymyalgia rheumatica (HCC) - History of muscle weakness and tenderness, elevated sed rate. Prednisone responsive: Patient presents today experiencing symptoms of a flare of polymyalgia rheumatica.  He is having increased myalgias in his upper and lower extremities.  He has had difficulty raising his arms above his head especially with the left shoulder as well as some difficulty rising from a seated position.  He has had to be more sedentary recently due to the severity of his symptoms.  He was last seen in the office in April at which time he was taking prednisone 8 mg daily.  The plan at that time was to reduce prednisone by 1 mg every 2 months.  He ended up reducing prednisone by 1 mg every 2 weeks and is currently on prednisone 5 mg daily.  Discussed that he will likely reduce the prednisone too quickly and is currently having a PMR flare.  Discussed that checking his sed rate will not be a reliable test given his chronic iron deficiency anemia.  Plan to increase prednisone back to 8 mg daily for now. The refill of prednisone 1 mg tablets will be sent to the pharmacy today.  He was advised to notify us if he does not notice improvement in his symptoms.  He will follow-up in the office in 2 months or sooner if needed.  Long term (current) use of systemic steroids -He will resume prednisone 8 mg daily.  He may be unable to taper prednisone due to recurrent flares while trying to reduce.  Discussed that ideally we would like to try reducing prednisone to the lowest dose possible which still controls his disease.  Discussed the risks of long-term prednisone use. CBC and CMP updated on 06/26/22.   Due to updated bone density.  We will need to discuss treatment  options at his follow-up visit since he will be remaining on long term prednisone.   History of iron deficiency anemia: Iron was low at 25 on 06/26/2022.  He has been started on ferrous sulfate 325 daily by his PCP, Dr. Veto Kemps.    Primary osteoarthritis of both hands: CMC, PIP, DIP thickening consistent with osteoarthritis of both hands.  Subluxation of bilateral CMC joints.  He has been noticing increased inflammation in the left hand to the point he has difficulty making complete fist.  He has diffuse swelling over the left wrist and hand noted today.  Status post total hip replacement, left - 12/04/20: due to a fall.  DDD (degenerative disc disease), lumbar - Status post fusion by Dr. Newell Coral. Chronic pain. He remains under the care of pain management, Dr. Berline Chough.  He takes oxycodone and morphine for pain relief.  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  He remains on allopurinol 200 mg daily.   Osteopenia of multiple sites - DEXA scan 07/21/20: showed T score -2.2 in the right femoral neck, BMD 0.638.  He was started on Fosamax 70 mg 1 tablet once weekly in December 2022 but he has not been taking Fosamax since the initial prescription. He  will require treatment given that he remains on long-term prednisone.  He is due to updated bone density this month.  An updated order was placed today.  He was advised to schedule an updated DEXA and at his follow-up visit we will discuss results as well as treatment options. Reclast may be a better option than fosamax given history of GERD/dysphagia secondary to radiation therapy.  Plan: DG BONE DENSITY (DXA)  Vitamin D deficiency  Chronic pain syndrome - Followed by Dr. Berline Chough.Taking oxycodone and morphine.   Psoriasis: No recurrence.   Other medical  conditions are listed as follows:  Oropharyngeal dysphagia  Liver abscess: In 2018 due to diverticulitis. Treated with IV antibiotics over 3 weeks.   Aneurysm of infrarenal abdominal aorta, unspecified whether ruptured [facility]): Followed by Dr. Randie Heinz. Pre TAVR CT showed large infrarenal abdominal aortic aneurysm which is increasing in size.   History of diverticulitis  History of gastroesophageal reflux (GERD)  History of aortic valve replacement: Performed by Dr. Laneta Simmers and Dr. Edison Pace TAVR on 06/07/20. He was advised to continue DAPT with aspirin and plavix.   History of hyperlipidemia  Essential hypertension: Pressure was 119/66 today in the office.  Cancer of tonsillar fossa [facility]): Treated with surgery and radiation therapy in 2019.   Anxiety and depression  History of atherectomy  Former smoker  Orders: Orders Placed This Encounter  Procedures   DG BONE DENSITY (DXA)   Meds ordered this encounter  Medications   predniSONE (DELTASONE) 1 MG tablet    Sig: Take 3 tablets (3 mg total) by mouth daily with breakfast. Take along with prednisone 5mg  tablet for a total of 8mg  daily.    Dispense:  90 tablet    Refill:  0    Follow-Up Instructions: Return in about 2 months (around 09/09/2022) for PMR, Osteoarthritis, Gout.   Gearldine Bienenstock, PA-C  Note - This record has been created using Dragon software.  Chart creation errors have been sought, but may not always  have been located. Such creation errors do not reflect on  the standard of medical care.

## 2022-07-10 ENCOUNTER — Ambulatory Visit: Payer: Medicare Other | Attending: Physician Assistant | Admitting: Physician Assistant

## 2022-07-10 ENCOUNTER — Encounter: Payer: Self-pay | Admitting: Physician Assistant

## 2022-07-10 VITALS — BP 119/66 | HR 65 | Resp 17 | Ht 68.0 in | Wt 147.0 lb

## 2022-07-10 DIAGNOSIS — I7143 Infrarenal abdominal aortic aneurysm, without rupture: Secondary | ICD-10-CM

## 2022-07-10 DIAGNOSIS — M1A0210 Idiopathic chronic gout, right elbow, without tophus (tophi): Secondary | ICD-10-CM

## 2022-07-10 DIAGNOSIS — Z862 Personal history of diseases of the blood and blood-forming organs and certain disorders involving the immune mechanism: Secondary | ICD-10-CM

## 2022-07-10 DIAGNOSIS — Z9889 Other specified postprocedural states: Secondary | ICD-10-CM

## 2022-07-10 DIAGNOSIS — M19042 Primary osteoarthritis, left hand: Secondary | ICD-10-CM

## 2022-07-10 DIAGNOSIS — E559 Vitamin D deficiency, unspecified: Secondary | ICD-10-CM

## 2022-07-10 DIAGNOSIS — M8589 Other specified disorders of bone density and structure, multiple sites: Secondary | ICD-10-CM

## 2022-07-10 DIAGNOSIS — M353 Polymyalgia rheumatica: Secondary | ICD-10-CM | POA: Diagnosis not present

## 2022-07-10 DIAGNOSIS — M19041 Primary osteoarthritis, right hand: Secondary | ICD-10-CM

## 2022-07-10 DIAGNOSIS — M5136 Other intervertebral disc degeneration, lumbar region: Secondary | ICD-10-CM

## 2022-07-10 DIAGNOSIS — I10 Essential (primary) hypertension: Secondary | ICD-10-CM

## 2022-07-10 DIAGNOSIS — Z87891 Personal history of nicotine dependence: Secondary | ICD-10-CM

## 2022-07-10 DIAGNOSIS — Z8639 Personal history of other endocrine, nutritional and metabolic disease: Secondary | ICD-10-CM

## 2022-07-10 DIAGNOSIS — G894 Chronic pain syndrome: Secondary | ICD-10-CM

## 2022-07-10 DIAGNOSIS — C090 Malignant neoplasm of tonsillar fossa: Secondary | ICD-10-CM

## 2022-07-10 DIAGNOSIS — Z7952 Long term (current) use of systemic steroids: Secondary | ICD-10-CM | POA: Diagnosis not present

## 2022-07-10 DIAGNOSIS — K750 Abscess of liver: Secondary | ICD-10-CM

## 2022-07-10 DIAGNOSIS — R1312 Dysphagia, oropharyngeal phase: Secondary | ICD-10-CM

## 2022-07-10 DIAGNOSIS — F419 Anxiety disorder, unspecified: Secondary | ICD-10-CM

## 2022-07-10 DIAGNOSIS — L409 Psoriasis, unspecified: Secondary | ICD-10-CM

## 2022-07-10 DIAGNOSIS — F32A Depression, unspecified: Secondary | ICD-10-CM

## 2022-07-10 DIAGNOSIS — Z952 Presence of prosthetic heart valve: Secondary | ICD-10-CM

## 2022-07-10 DIAGNOSIS — Z96642 Presence of left artificial hip joint: Secondary | ICD-10-CM

## 2022-07-10 DIAGNOSIS — Z8719 Personal history of other diseases of the digestive system: Secondary | ICD-10-CM

## 2022-07-10 MED ORDER — PREDNISONE 1 MG PO TABS: 3.0000 mg | ORAL_TABLET | Freq: Every day | ORAL | 0 refills | Status: DC

## 2022-07-16 ENCOUNTER — Encounter: Payer: Medicare Other | Attending: Physical Medicine and Rehabilitation | Admitting: Registered Nurse

## 2022-07-16 ENCOUNTER — Encounter: Payer: Self-pay | Admitting: Registered Nurse

## 2022-07-16 VITALS — BP 142/71 | HR 69 | Ht 68.0 in | Wt 148.0 lb

## 2022-07-16 DIAGNOSIS — M47816 Spondylosis without myelopathy or radiculopathy, lumbar region: Secondary | ICD-10-CM | POA: Diagnosis not present

## 2022-07-16 DIAGNOSIS — G894 Chronic pain syndrome: Secondary | ICD-10-CM | POA: Diagnosis not present

## 2022-07-16 DIAGNOSIS — M5416 Radiculopathy, lumbar region: Secondary | ICD-10-CM | POA: Insufficient documentation

## 2022-07-16 DIAGNOSIS — Z79891 Long term (current) use of opiate analgesic: Secondary | ICD-10-CM | POA: Insufficient documentation

## 2022-07-16 DIAGNOSIS — Z5181 Encounter for therapeutic drug level monitoring: Secondary | ICD-10-CM | POA: Insufficient documentation

## 2022-07-16 DIAGNOSIS — M353 Polymyalgia rheumatica: Secondary | ICD-10-CM | POA: Diagnosis not present

## 2022-07-16 MED ORDER — MORPHINE SULFATE ER 15 MG PO TBCR: 15.0000 mg | EXTENDED_RELEASE_TABLET | Freq: Two times a day (BID) | ORAL | 0 refills | Status: DC

## 2022-07-16 MED ORDER — OXYCODONE HCL 10 MG PO TABS: 10.0000 mg | ORAL_TABLET | ORAL | 0 refills | Status: DC | PRN

## 2022-07-16 NOTE — Progress Notes (Signed)
Subjective:    Patient ID: Brandon [name], male    DOB: 06/11/1940, 82 y.o.   MRN: [id number]  HPI: Brandon [name] is a 82 y.o. male who returns for follow up appointment for chronic pain and medication refill. He states his pain is located in his lower back radiating into his bilateral hips. He rates his pain 5. His current exercise regime is walking and performing stretching exercises.  Brandon [name] reports he has stopped taking his gabapentin a month ago, no reactions. We will continue to monitor.   Brandon [name] reports he is depressed, he is complaint with his medication. He denies suicidal ideation. He will continue with his current medication regimen, he will discussed with Dr Berline Chough he states.   Brandon [name] Morphine equivalent is 120.00 MME.   Last UDS was Performed on 05/16/2022, it was consistent.    Pain Inventory Average Pain 5 Pain Right Now 5 My pain is constant, dull, stabbing, and aching  In the last 24 hours, has pain interfered with the following? General activity 5 Relation with others 4 Enjoyment of life 4 What TIME of day is your pain at its worst? morning  and daytime Sleep (in general) Poor  Pain is worse with: walking, bending, standing, and some activites Pain improves with: rest, heat/ice, medication, and injections Relief from Meds: 7  Family History  Problem Relation Age of Onset   Heart disease Father 33       Living   Coronary artery disease Father        CABG   Alzheimer's disease Mother 42       Deceased   Arthritis Mother    Aneurysm Brother    Stomach cancer Maternal Uncle    Brain cancer Maternal Aunt        x2   Obesity Daughter        Had Bypass Sx   Social History   Socioeconomic History   Marital status: Married    Spouse name: etta   Number of children: 2   Years of education: Not on file   Highest education level: Not on file  Occupational History   Occupation: retired    Associate Professor: RETIRED    Comment: disabled due to back problems   Tobacco Use   Smoking status: Former    Packs/day: 1.50    Years: 50.00    Additional pack years: 0.00    Total pack years: 75.00    Types: Cigarettes    Quit date: 09/05/2017    Years since quitting: 4.8    Passive exposure: Never   Smokeless tobacco: Never   Tobacco comments:    smoked less than 1 ppd for 40+ years;   Vaping Use   Vaping Use: Never used  Substance and Sexual Activity   Alcohol use: Not Currently   Drug use: Yes    Types: Oxycodone, Morphine    Comment: has prescription   Sexual activity: Not Currently  Other Topics Concern   Not on file  Social History Narrative   ** Merged History Encounter **       Married (3rd), [place]. 2 children from 1st marriage, 4 step children.    Retired on disability due to back    Former Runner, broadcasting/film/video.   restores antique furniture for a hobby.       Cell # [phone]   Social Determinants of Health   Financial Resource Strain: Low Risk  (06/15/2022)   Overall Financial  Resource Strain (CARDIA)    Difficulty of Paying Living Expenses: Not hard at all  Food Insecurity: No Food Insecurity (06/15/2022)   Hunger Vital Sign    Worried About Running Out of Food in the Last Year: Never true    Ran Out of Food in the Last Year: Never true  Transportation Needs: No Transportation Needs (06/15/2022)   PRAPARE - Administrator, Civil Service (Medical): No    Lack of Transportation (Non-Medical): No  Physical Activity: Inactive (06/15/2022)   Exercise Vital Sign    Days of Exercise per Week: 0 days    Minutes of Exercise per Session: 0 min  Stress: No Stress Concern Present (06/15/2022)   [employer] of Occupational Health - Occupational Stress Questionnaire    Feeling of Stress : Not at all  Social Connections: Moderately Integrated (06/07/2021)   Social Connection and Isolation Panel [NHANES]    Frequency of Communication with Friends and Family: Twice a week    Frequency of Social Gatherings  with Friends and Family: Twice a week    Attends Religious Services: More than 4 times per year    Active Member of [employer] or Organizations: No    Attends Banker Meetings: Never    Marital Status: Married   Past Surgical History:  Procedure Laterality Date   ABDOMINAL AORTIC ENDOVASCULAR STENT GRAFT N/A 03/03/2021   Procedure: ABDOMINAL AORTIC ENDOVASCULAR STENT GRAFT;  Surgeon: Maeola Harman, MD;  Location: [facility] OR;  Service: Vascular;  Laterality: N/A;   ABDOMINAL AORTOGRAM W/LOWER EXTREMITY N/A 04/10/2021   Procedure: ABDOMINAL AORTOGRAM W/LOWER EXTREMITY;  Surgeon: Maeola Harman, MD;  Location: [facility] INVASIVE CV LAB;  Service: Cardiovascular;  Laterality: N/A;   BACK SURGERY     CARDIAC CATHETERIZATION     CATARACT EXTRACTION W/ INTRAOCULAR LENS  IMPLANT, BILATERAL Bilateral 01/2012 - 02/2012   CORONARY ATHERECTOMY N/A 04/27/2020   Procedure: CORONARY ATHERECTOMY;  Surgeon: Tonny Bollman, MD;  Location: [facility] INVASIVE CV LAB;  Service: Cardiovascular;  Laterality: N/A;   CORONARY IMAGING/OCT N/A 04/27/2020   Procedure: INTRAVASCULAR IMAGING/OCT;  Surgeon: Tonny Bollman, MD;  Location: [facility] INVASIVE CV LAB;  Service: Cardiovascular;  Laterality: N/A;   CORONARY PRESSURE/FFR STUDY N/A 04/27/2020   Procedure: INTRAVASCULAR PRESSURE WIRE/FFR STUDY;  Surgeon: Tonny Bollman, MD;  Location: [facility] INVASIVE CV LAB;  Service: Cardiovascular;  Laterality: N/A;   CORONARY STENT INTERVENTION N/A 04/27/2020   Procedure: CORONARY STENT INTERVENTION;  Surgeon: Tonny Bollman, MD;  Location: [facility] INVASIVE CV LAB;  Service: Cardiovascular;  Laterality: N/A;   DIRECT LARYNGOSCOPY Left 10/09/2017   Procedure: DIRECT LARYNGOSCOPY WITH BOPSY;  Surgeon: Flo Shanks, MD;  Location: [facility];  Service: ENT;  Laterality: Left;   ESOPHAGOGASTRODUODENOSCOPY (EGD) WITH ESOPHAGEAL DILATION     "couple times" (07/19/2016)   ESOPHAGOSCOPY Left 10/09/2017   Procedure:  ESOPHAGOSCOPY;  Surgeon: Flo Shanks, MD;  Location: [facility];  Service: ENT;  Laterality: Left;   IR GASTROSTOMY TUBE MOD SED  01/08/2018   IR THORACENTESIS ASP PLEURAL SPACE W/IMG GUIDE  07/19/2016   LAPAROSCOPIC CHOLECYSTECTOMY  1984   LUMBAR DISC SURGERY  05/1996   L4-5; Dr. Rosanne Sack LAMINECTOMY/DECOMPRESSION MICRODISCECTOMY  10/2002   L3-4. Dr. Newell Coral   MULTIPLE TOOTH EXTRACTIONS  1980s   PARTIAL GLOSSECTOMY  12/02/2017   Dr. Hezzie Bump- [facility]   pharyngoplasty for closure of tingue base defect  12/02/2017   Dr. Hezzie [facility]ump- [facility]   PICC LINE INSERTION  07/15/2016   POSTERIOR LUMBAR FUSION  09/1996   Ray cage, L4-5 Dr. Jule Ser   PROSTATE BIOPSY  ~ 2017   radical tonsillectomy Left 12/02/2017   Dr. [facility]zz[facility]ump at [facility]   RIGHT HEART CATH AND CORONARY ANGIOGRAPHY N/A 03/31/2020   Procedure: RIGHT HEART CATH AND CORONARY ANGIOGRAPHY;  Surgeon: Laurey Morale, MD;  Location: [facility] INVASIVE CV LAB;  Service: Cardiovascular;  Laterality: N/A;   RIGID BRONCHOSCOPY Left 10/09/2017   Procedure: RIGID BRONCHOSCOPY;  Surgeon: Flo Shanks, MD;  Location: [facility];  Service: ENT;  Laterality: Left;   TEE WITHOUT CARDIOVERSION N/A 06/07/2020   Procedure: TRANSESOPHAGEAL ECHOCARDIOGRAM (TEE);  Surgeon: Tonny Bollman, MD;  Location: [facility] OR;  Service: Open Heart Surgery;  Laterality: N/A;   TONSILLECTOMY     TOTAL HIP ARTHROPLASTY Left 12/04/2020   Procedure: TOTAL HIP ARTHROPLASTY ANTERIOR APPROACH;  Surgeon: Samson Frederic, MD;  Location: [facility]S;  Service: Orthopedics;  Laterality: Left;   TOTAL HIP ARTHROPLASTY Left 11/2020   TRACHEOSTOMY  12/02/2017   Dr. Hezzie Bump- [facility]   ULTRASOUND GUIDANCE FOR VASCULAR ACCESS Right 06/07/2020   Procedure: ULTRASOUND GUIDANCE FOR VASCULAR ACCESS;  Surgeon: Tonny Bollman, MD;  Location: [facility] OR;  Service: Open Heart Surgery;  Laterality: Right;   ULTRASOUND GUIDANCE FOR VASCULAR ACCESS Bilateral 03/03/2021    Procedure: ULTRASOUND GUIDANCE FOR VASCULAR ACCESS, BILATERAL FEMORAL ARTERIES;  Surgeon: Maeola Harman, MD;  Location: [facility] OR;  Service: Vascular;  Laterality: Bilateral;   VASCULAR SURGERY     Past Surgical History:  Procedure Laterality Date   ABDOMINAL AORTIC ENDOVASCULAR STENT GRAFT N/A 03/03/2021   Procedure: ABDOMINAL AORTIC ENDOVASCULAR STENT GRAFT;  Surgeon: Maeola Harman, MD;  Location: [facility] OR;  Service: Vascular;  Laterality: N/A;   ABDOMINAL AORTOGRAM W/LOWER EXTREMITY N/A 04/10/2021   Procedure: ABDOMINAL AORTOGRAM W/LOWER EXTREMITY;  Surgeon: Maeola Harman, MD;  Location: [facility] INVASIVE CV LAB;  Service: Cardiovascular;  Laterality: N/A;   BACK SURGERY     CARDIAC CATHETERIZATION     CATARACT EXTRACTION W/ INTRAOCULAR LENS  IMPLANT, BILATERAL Bilateral 01/2012 - 02/2012   CORONARY ATHERECTOMY N/A 04/27/2020   Procedure: CORONARY ATHERECTOMY;  Surgeon: Tonny Bollman, MD;  Location: [facility] INVASIVE CV LAB;  Service: Cardiovascular;  Laterality: N/A;   CORONARY IMAGING/OCT N/A 04/27/2020   Procedure: INTRAVASCULAR IMAGING/OCT;  Surgeon: Tonny Bollman, MD;  Location: [facility] INVASIVE CV LAB;  Service: Cardiovascular;  Laterality: N/A;   CORONARY PRESSURE/FFR STUDY N/A 04/27/2020   Procedure: INTRAVASCULAR PRESSURE WIRE/FFR STUDY;  Surgeon: Tonny Bollman, MD;  Location: [facility] INVASIVE CV LAB;  Service: Cardiovascular;  Laterality: N/A;   CORONARY STENT INTERVENTION N/A 04/27/2020   Procedure: CORONARY STENT INTERVENTION;  Surgeon: Tonny Bollman, MD;  Location: [facility] INVASIVE CV LAB;  Service: Cardiovascular;  Laterality: N/A;   DIRECT LARYNGOSCOPY Left 10/09/2017   Procedure: DIRECT LARYNGOSCOPY WITH BOPSY;  Surgeon: Flo Shanks, MD;  Location: [facility];  Service: ENT;  Laterality: Left;   ESOPHAGOGASTRODUODENOSCOPY (EGD) WITH ESOPHAGEAL DILATION     "couple times" (07/19/2016)   ESOPHAGOSCOPY Left 10/09/2017   Procedure: ESOPHAGOSCOPY;   Surgeon: Flo Shanks, MD;  Location: [facility];  Service: ENT;  Laterality: Left;   IR GASTROSTOMY TUBE MOD SED  01/08/2018   IR THORACENTESIS ASP PLEURAL SPACE W/IMG GUIDE  07/19/2016   LAPAROSCOPIC CHOLECYSTECTOMY  1984   LUMBAR DISC SURGERY  05/1996   L4-5; Dr. Rosanne Sack LAMINECTOMY/DECOMPRESSION MICRODISCECTOMY  10/2002   L3-4. Dr. Newell Coral  MULTIPLE TOOTH EXTRACTIONS  1980s   PARTIAL GLOSSECTOMY  12/02/2017   Dr. Hezzie Bump- [facility]   pharyngoplasty for closure of tingue base defect  12/02/2017   Dr. Hezzie Bump- [facility]   PICC LINE INSERTION  07/15/2016   POSTERIOR LUMBAR FUSION  09/1996   Ray cage, L4-5 Dr. Jule Ser   PROSTATE BIOPSY  ~ 2017   radical tonsillectomy Left 12/02/2017   Dr. Hezzie Bump at [facility]   RIGHT HEART CATH AND CORONARY ANGIOGRAPHY N/A 03/31/2020   Procedure: RIGHT HEART CATH AND CORONARY ANGIOGRAPHY;  Surgeon: Laurey Morale, MD;  Location: [facility] INVASIVE CV LAB;  Service: Cardiovascular;  Laterality: N/A;   RIGID BRONCHOSCOPY Left 10/09/2017   Procedure: RIGID BRONCHOSCOPY;  Surgeon: Flo Shanks, MD;  Location: [facility];  Service: ENT;  Laterality: Left;   TEE WITHOUT CARDIOVERSION N/A 06/07/2020   Procedure: TRANSESOPHAGEAL ECHOCARDIOGRAM (TEE);  Surgeon: Tonny Bollman, MD;  Location: [facility] OR;  Service: Open Heart Surgery;  Laterality: N/A;   TONSILLECTOMY     TOTAL HIP ARTHROPLASTY Left 12/04/2020   Procedure: TOTAL HIP ARTHROPLASTY ANTERIOR APPROACH;  Surgeon: Samson Frederic, MD;  Location: [facility];  Service: Orthopedics;  Laterality: Left;   TOTAL HIP ARTHROPLASTY Left 11/2020   TRACHEOSTOMY  12/02/2017   Dr. Hezzie Bump- [facility]   ULTRASOUND GUIDANCE FOR VASCULAR ACCESS Right 06/07/2020   Procedure: ULTRASOUND GUIDANCE FOR VASCULAR ACCESS;  Surgeon: Tonny Bollman, MD;  Location: [facility] OR;  Service: Open Heart Surgery;  Laterality: Right;   ULTRASOUND GUIDANCE FOR VASCULAR ACCESS Bilateral 03/03/2021   Procedure: ULTRASOUND  GUIDANCE FOR VASCULAR ACCESS, BILATERAL FEMORAL ARTERIES;  Surgeon: Maeola Harman, MD;  Location: [facility] OR;  Service: Vascular;  Laterality: Bilateral;   VASCULAR SURGERY     Past Medical History:  Diagnosis Date   Abdominal aneurysm (HCC)    Arthritis    "all over" (07/19/2016)   BPH (benign prostatic hypertrophy)    CAD (coronary artery disease)    Chicken pox    Chronic lower back pain    s/p surgical fusion   Depression    Diverticulosis    Esophageal stricture    GERD (gastroesophageal reflux disease)    Hepatitis B 1984   Hiatal hernia    History of radiation therapy 01/16/18- 03/05/18   Left Tonsil, 66 Gy in 33 fractions to high risk nodal echelons.    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 Excell Seltzer and Dr Laneta Simmers    Severe aortic stenosis    TIA (transient ischemic attack) 1990s   hx   tonsillar ca dx'd 11/2017   Tubular adenoma of colon 2009   There were no vitals taken for this visit.  Opioid Risk Score:   Fall Risk Score:  `1  Depression screen [facility] 2/9     06/26/2022    8:06 AM 06/15/2022    9:49 AM 06/11/2022    2:09 PM 05/16/2022   10:56 AM 03/09/2022   10:15 AM 02/12/2022    1:16 PM 01/23/2022    2:56 PM  Depression screen PHQ 2/9  Decreased Interest 1 0 0 1 0 1 0  Down, Depressed, Hopeless 1 0 0  0 1 0  PHQ - 2 Score 2 0 0 1 0 2 0  Altered sleeping 3  Tired, decreased energy 3        Change in appetite 3        Feeling bad or failure about yourself  2        Trouble concentrating 0        Moving slowly or fidgety/restless 1        Suicidal thoughts 0        PHQ-9 Score 14        Difficult doing work/chores Somewhat difficult            Review of Systems  Musculoskeletal:  Positive for back pain.       B/L hip pain       Objective:   Physical  Exam Vitals and nursing note reviewed.  Constitutional:      Appearance: Normal appearance.  Cardiovascular:     Rate and Rhythm: Normal rate and regular rhythm.     Pulses: Normal pulses.     Heart sounds: Normal heart sounds.  Pulmonary:     Effort: Pulmonary effort is normal.     Breath sounds: Normal breath sounds.  Musculoskeletal:     Cervical back: Normal range of motion and neck supple.     Comments: Normal Muscle Bulk and Muscle Testing Reveals:  Upper Extremities: Full ROM and Muscle Strength 5/5 Left AC Joint Tenderness  Lumbar Paraspinal Tenderness: L-4-L-5 Lower Extremities: Full ROM and Muscle Strength 5/5 Arises from chair with ease Narrow Based  Gait     Skin:    General: Skin is warm and dry.  Neurological:     Mental Status: He is alert and oriented to person, place, and time.  Psychiatric:        Mood and Affect: Mood normal.        Behavior: Behavior normal.       Assessment & Plan:  Lumbar Post-Laminectomy Syndrome: Lumbar Spondylosis Continue HEP as Tolerated. Continue to Monitor. 07/16/2022 Lumbar Radiculitis: Continue HEP as Tolerated. Continue to Monitor. Brandon [name] has stopped taking his gabapentin over a  month ago. Continue to Monitor. 07/16/2022 Chronic Pain Syndrome: Continue MS Contin 15 mg every 12 hours #60,  Oxycodone 10 mg one tablet every 4 hours #150. We will continue the opioid monitoring program, this consists of regular clinic visits, examinations, urine drug screen, pill counts as well as use of [place] Controlled Substance Reporting system. A 12 month History has been reviewed on the [place] Controlled Substance Reporting System 07/16/2022    F/U in 2 months

## 2022-07-20 ENCOUNTER — Other Ambulatory Visit: Payer: Self-pay | Admitting: Rheumatology

## 2022-07-20 NOTE — Telephone Encounter (Signed)
Last Fill: 04/19/2022  Labs: 06/26/2022 Glucose 115, Alk. Phos. 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

## 2022-07-27 ENCOUNTER — Other Ambulatory Visit (HOSPITAL_COMMUNITY): Payer: Self-pay | Admitting: Cardiology

## 2022-07-27 ENCOUNTER — Ambulatory Visit: Payer: Medicare Other | Admitting: Family Medicine

## 2022-07-27 ENCOUNTER — Other Ambulatory Visit (HOSPITAL_COMMUNITY): Payer: Self-pay

## 2022-07-27 DIAGNOSIS — M810 Age-related osteoporosis without current pathological fracture: Secondary | ICD-10-CM | POA: Diagnosis not present

## 2022-07-27 DIAGNOSIS — Z13820 Encounter for screening for osteoporosis: Secondary | ICD-10-CM | POA: Diagnosis not present

## 2022-07-27 DIAGNOSIS — M8589 Other specified disorders of bone density and structure, multiple sites: Secondary | ICD-10-CM | POA: Diagnosis not present

## 2022-07-27 LAB — HM DEXA SCAN

## 2022-07-27 MED ORDER — EZETIMIBE 10 MG PO TABS: ORAL_TABLET | ORAL | 0 refills | Status: DC

## 2022-07-27 NOTE — Telephone Encounter (Signed)
Meds ordered this encounter  Medications   ezetimibe (ZETIA) 10 MG tablet    Sig: TAKE 1 TABLET(10 MG) BY MOUTH DAILY. NEED FOLLOW UP APPOINTMENT FOR MORE REFILLS    Dispense:  90 tablet    Refill:  0    **Patient requests 90 days supply**

## 2022-07-31 ENCOUNTER — Telehealth: Payer: Self-pay | Admitting: *Deleted

## 2022-07-31 NOTE — Telephone Encounter (Signed)
Received DEXA results from [facility].  Date of Scan: 07/27/2022  Lowest T-score:-2.7  BMD:0.558  Lowest site measured:Left Femoral Neck  DX: Osteoporosis   Significant changes in BMD and site measured (5% and above):-20% Right Total Femur  Current Regimen:N/A  Recommendation:Patient will require treatment for Osteoporosis. Results and treatment options to be discussed at upcoming appointment.   Reviewed by:Sherron Ales, PA-C  Next Appointment:  08/22/2022

## 2022-08-01 ENCOUNTER — Encounter: Payer: Self-pay | Admitting: Family Medicine

## 2022-08-01 ENCOUNTER — Ambulatory Visit (INDEPENDENT_AMBULATORY_CARE_PROVIDER_SITE_OTHER): Payer: Medicare Other | Admitting: Family Medicine

## 2022-08-01 VITALS — BP 120/56 | HR 58 | Temp 98.0°F | Ht 68.0 in | Wt 150.0 lb

## 2022-08-01 DIAGNOSIS — E441 Mild protein-calorie malnutrition: Secondary | ICD-10-CM

## 2022-08-01 DIAGNOSIS — Z85810 Personal history of malignant neoplasm of tongue: Secondary | ICD-10-CM | POA: Diagnosis not present

## 2022-08-01 DIAGNOSIS — R200 Anesthesia of skin: Secondary | ICD-10-CM | POA: Diagnosis not present

## 2022-08-01 DIAGNOSIS — G894 Chronic pain syndrome: Secondary | ICD-10-CM | POA: Diagnosis not present

## 2022-08-01 DIAGNOSIS — K068 Other specified disorders of gingiva and edentulous alveolar ridge: Secondary | ICD-10-CM

## 2022-08-01 DIAGNOSIS — D508 Other iron deficiency anemias: Secondary | ICD-10-CM | POA: Diagnosis not present

## 2022-08-01 NOTE — Assessment & Plan Note (Signed)
Brandon [name] protein levels are mildly low. I encouraged him about his dietary efforts. His weight is currently stable.

## 2022-08-01 NOTE — Assessment & Plan Note (Signed)
I suspect his anemia is multifactorial, however, he did have low iron stores. He is on an iron supplement now and he feels this has helped. I will reassess his CBC and iron panel in 3 months.

## 2022-08-01 NOTE — Assessment & Plan Note (Signed)
Currently managed by pain specialist. I did point out that he is receiving 105 MME/day. Some tolerance is expecting, but he should be cautious in pushing for higher doses of opioids.

## 2022-08-01 NOTE — Progress Notes (Signed)
[facility] [address] [place] [address] Dept: [phone] Dept Fax: [phone]  Chronic Care Office Visit  Subjective:    Patient ID: Brandon [name], male    DOB: 1940/12/30, 82 y.o..   MRN: [id number]  Chief Complaint  Patient presents with   Medical Management of Chronic Issues    4 week f/u.     History of Present Illness:  Patient is in today for reassessment of chronic medical issues.  Mr. Thornton notes he remains in pain much of the time. He is managed by physiatry related to his chronic pain. He is currently prescribed MS Contin 15 mg bid and oxycodone 10 mg 5 tablets a day. He feels this no longer works well for him.  Mr. Sinning notes that more recently, he has had issues with his left lower lip and chin feeling numb. This occurs intermittently. He has a history of prior tonsillar cancer, s/p surgery and radiation. He has not felt any masses developing int he neck or jaw. He does admit to some soreness in his gum when he chews. He feels he is having some bone loss to the lower gum margin causing his lower denture not to fit well.  I had seen Mr. Spoelstra last month complaining of fatigue. I noted he had experienced 38 lbs of weight loss since Oct. 2022. He admitted to some difficulty with chewing meats due to his dentures and with difficulty swallowing these secondary to his prior radiation. He has made a concerned effort to increase his intake. He does find chopped chicken to go down better than beef. He is also using protein shakes in the morning.  Past Medical History: Patient Active Problem List   Diagnosis Date Noted   Malnutrition of mild degree (HCC) 08/01/2022   Insomnia 06/26/2022   Arthritis of left hand 06/26/2022   Weight loss 06/26/2022   Chronic gout of multiple sites 04/30/2022   Acute exacerbation of chronic bronchitis (HCC) 04/03/2022   Chronically on opiate therapy 02/12/2022   Sacroiliac joint pain  02/12/2022   At risk for polypharmacy 01/19/2022   Aspiration pneumonia (HCC) 01/18/2022   Trochanteric bursitis of left hip 01/02/2022   Dysfunction of left eustachian tube 12/05/2021   Hypotension 08/18/2021   AKI (acute kidney injury) (HCC) 08/17/2021   Hyponatremia 06/26/2021   Hyperkalemia 06/26/2021   Acute metabolic encephalopathy 06/26/2021   Myoclonic jerking 06/26/2021   Lumbar radiculopathy 06/08/2021   Lumbar postlaminectomy syndrome 05/03/2021   Degeneration of lumbar intervertebral disc 05/03/2021   Lumbar spondylosis 05/03/2021   History of hip fracture 04/24/2021   S/P abdominal aortic aneurysm repair 04/24/2021   Chronic allergic rhinitis 01/24/2021   Demand ischemia    Acute respiratory failure with hypoxia (HCC)    Elevated troponin    Chronic diastolic heart failure (HCC) 12/03/2020   Steatosis of liver 11/01/2020   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   Hepatitis B core antibody positive 11/01/2020   Benign prostatic hyperplasia 11/01/2020   Chronic obstructive pulmonary disease (HCC) 07/20/2020   History of placement of stent in LAD coronary artery 07/20/2020   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   TIA (transient ischemic attack)    Esophageal stricture    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   Primary osteoarthritis of both hands 01/11/2020   Piriformis syndrome of both sides 09/23/2019   Pain of both shoulder joints 07/29/2019   Sciatica associated with disorder of lumbar spine 04/24/2019  Primary osteoarthritis involving multiple joints 04/24/2019   Chronic pain syndrome 04/24/2019   Mixed anxiety and depressive disorder 04/15/2018   Oropharyngeal dysphagia 03/11/2018   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   Liver abscess 07/10/2016   Iron deficiency anemia 07/10/2016   Essential hypertension 05/19/2013   Psoriasis 01/13/2009   Coronary artery disease 09/15/2008   Diverticulosis of colon 08/27/2007    History of benign prostatic hyperplasia 08/27/2007   Hyperlipidemia 03/19/2007   Gastroesophageal reflux disease 03/19/2007   Past Surgical History:  Procedure Laterality Date   ABDOMINAL AORTIC ENDOVASCULAR STENT GRAFT N/A 03/03/2021   Procedure: ABDOMINAL AORTIC ENDOVASCULAR STENT GRAFT;  Surgeon: Maeola Harman, MD;  Location: [facility] OR;  Service: Vascular;  Laterality: N/A;   ABDOMINAL AORTOGRAM W/LOWER EXTREMITY N/A 04/10/2021   Procedure: ABDOMINAL AORTOGRAM W/LOWER EXTREMITY;  Surgeon: Maeola Harman, MD;  Location: [facility] INVASIVE CV LAB;  Service: Cardiovascular;  Laterality: N/A;   BACK SURGERY     CARDIAC CATHETERIZATION     CATARACT EXTRACTION W/ INTRAOCULAR LENS  IMPLANT, BILATERAL Bilateral 01/2012 - 02/2012   CORONARY ATHERECTOMY N/A 04/27/2020   Procedure: CORONARY ATHERECTOMY;  Surgeon: Tonny Bollman, MD;  Location: [facility] INVASIVE CV LAB;  Service: Cardiovascular;  Laterality: N/A;   CORONARY IMAGING/OCT N/A 04/27/2020   Procedure: INTRAVASCULAR IMAGING/OCT;  Surgeon: Tonny Bollman, MD;  Location: [facility] INVASIVE CV LAB;  Service: Cardiovascular;  Laterality: N/A;   CORONARY PRESSURE/FFR STUDY N/A 04/27/2020   Procedure: INTRAVASCULAR PRESSURE WIRE/FFR STUDY;  Surgeon: Tonny Bollman, MD;  Location: [facility] INVASIVE CV LAB;  Service: Cardiovascular;  Laterality: N/A;   CORONARY STENT INTERVENTION N/A 04/27/2020   Procedure: CORONARY STENT INTERVENTION;  Surgeon: Tonny Bollman, MD;  Location: [facility] INVASIVE CV LAB;  Service: Cardiovascular;  Laterality: N/A;   DIRECT LARYNGOSCOPY Left 10/09/2017   Procedure: DIRECT LARYNGOSCOPY WITH BOPSY;  Surgeon: Flo Shanks, MD;  Location: [facility];  Service: ENT;  Laterality: Left;   ESOPHAGOGASTRODUODENOSCOPY (EGD) WITH ESOPHAGEAL DILATION     "couple times" (07/19/2016)   ESOPHAGOSCOPY Left 10/09/2017   Procedure: ESOPHAGOSCOPY;  Surgeon: Flo Shanks, MD;  Location: [facility];  Service: ENT;   Laterality: Left;   IR GASTROSTOMY TUBE MOD SED  01/08/2018   IR THORACENTESIS ASP PLEURAL SPACE W/IMG GUIDE  07/19/2016   LAPAROSCOPIC CHOLECYSTECTOMY  1984   LUMBAR DISC SURGERY  05/1996   L4-5; Dr. Rosanne Sack LAMINECTOMY/DECOMPRESSION MICRODISCECTOMY  10/2002   L3-4. Dr. Newell Coral   MULTIPLE TOOTH EXTRACTIONS  1980s   PARTIAL GLOSSECTOMY  12/02/2017   Dr. Hezzie Bump- [facility]   pharyngoplasty for closure of tingue base defect  12/02/2017   Dr. Hezzie Bump- [facility]   PICC LINE INSERTION  07/15/2016   POSTERIOR LUMBAR FUSION  09/1996   Ray cage, L4-5 Dr. Jule Ser   PROSTATE BIOPSY  ~ 2017   radical tonsillectomy Left 12/02/2017   Dr. [facility]zzie [facility]ump at [facility]   RIGHT HEART CATH AND CORONARY ANGIOGRAPHY N/A 03/31/2020   Procedure: RIGHT HEART CATH AND CORONARY ANGIOGRAPHY;  Surgeon: Laurey Morale, MD;  Location: [facility] INVASIVE CV LAB;  Service: Cardiovascular;  Laterality: N/A;   RIGID BRONCHOSCOPY Left 10/09/2017   Procedure: RIGID BRONCHOSCOPY;  Surgeon: Flo Shanks, MD;  Location: [facility];  Service: ENT;  Laterality: Left;   TEE WITHOUT CARDIOVERSION N/A 06/07/2020   Procedure: TRANSESOPHAGEAL ECHOCARDIOGRAM (TEE);  Surgeon: Tonny Bollman, MD;  Location: [facility] OR;  Service: Open Heart Surgery;  Laterality: N/A;   TONSILLECTOMY  TOTAL HIP ARTHROPLASTY Left 12/04/2020   Procedure: TOTAL HIP ARTHROPLASTY ANTERIOR APPROACH;  Surgeon: Samson Frederic, MD;  Location: [facility] ORS;  Service: Orthopedics;  Laterality: Left;   TOTAL HIP ARTHROPLASTY Left 11/2020   TRACHEOSTOMY  12/02/2017   Dr. Hezzie Bump- [facility]   ULTRASOUND GUIDANCE FOR VASCULAR ACCESS Right 06/07/2020   Procedure: ULTRASOUND GUIDANCE FOR VASCULAR ACCESS;  Surgeon: Tonny Bollman, MD;  Location: [facility] OR;  Service: Open Heart Surgery;  Laterality: Right;   ULTRASOUND GUIDANCE FOR VASCULAR ACCESS Bilateral 03/03/2021   Procedure: ULTRASOUND GUIDANCE FOR VASCULAR ACCESS, BILATERAL FEMORAL ARTERIES;  Surgeon: Maeola Harman, MD;  Location: [facility] OR;  Service: Vascular;  Laterality: Bilateral;   VASCULAR SURGERY     Family History  Problem Relation Age of Onset   Heart disease Father 68       Living   Coronary artery disease Father        CABG   Alzheimer's disease Mother 40       Deceased   Arthritis Mother    Aneurysm Brother    Stomach cancer Maternal Uncle    Brain cancer Maternal Aunt        x2   Obesity Daughter        Had Bypass Sx   Outpatient Medications Prior to Visit  Medication Sig Dispense Refill   acetaminophen (TYLENOL) 500 MG tablet Take 1,000 mg by mouth every 8 (eight) hours as needed for mild pain or headache.     albuterol (VENTOLIN HFA) 108 (90 Base) MCG/ACT inhaler Inhale 2 puffs into the lungs every 6 (six) hours as needed for wheezing or shortness of breath. 6.7 g 2   allopurinol (ZYLOPRIM) 100 MG tablet TAKE 2 TABLETS(200 MG) BY MOUTH DAILY 180 tablet 0   amLODipine (NORVASC) 10 MG tablet TAKE 1 TABLET(10 MG) BY MOUTH DAILY 90 tablet 0   amLODipine (NORVASC) 5 MG tablet Take 1 tablet (5 mg total) by mouth daily. 90 tablet 3   aspirin EC 81 MG tablet Take 81 mg by mouth at bedtime. Swallow whole.     atorvastatin (LIPITOR) 80 MG tablet TAKE 1 TABLET(80 MG) BY MOUTH DAILY 90 tablet 3   carvedilol (COREG) 6.25 MG tablet Take 1 tablet (6.25 mg total) by mouth 2 (two) times daily with a meal. 60 tablet 3   citalopram (CELEXA) 40 MG tablet Take 1 tablet (40 mg total) by mouth daily. 30 tablet 5   ezetimibe (ZETIA) 10 MG tablet TAKE 1 TABLET(10 MG) BY MOUTH DAILY. NEED FOLLOW UP APPOINTMENT FOR MORE REFILLS 90 tablet 0   FLUoxetine (PROZAC) 40 MG capsule Take 1 capsule (40 mg total) by mouth daily. 30 capsule 5   fluticasone (FLONASE) 50 MCG/ACT nasal spray SHAKE LIQUID AND USE 2 SPRAYS IN EACH NOSTRIL DAILY AS NEEDED FOR RHINITIS OR ALLERGIES (Patient taking differently: Place 2 sprays into both nostrils daily.) 16 g 11   guaiFENesin (MUCINEX) 600 MG 12 hr tablet Take 600 mg  by mouth 2 (two) times daily as needed for cough or to loosen phlegm.     hydrOXYzine (ATARAX) 10 MG tablet Take 1-2 tablets (10-20 mg total) by mouth at bedtime as needed for itching (or sleep). 30 tablet 3   ipratropium (ATROVENT) 0.02 % nebulizer solution Use 1 vial (0.5 mg total) by nebulization every 6 (six) hours as needed for wheezing or shortness of breath. 75 mL 12   ipratropium (ATROVENT) 0.03 % nasal spray Place 2 sprays into both nostrils every 12 (twelve)  hours. 30 mL 12   Iron, Ferrous Sulfate, 325 (65 Fe) MG TABS Take 325 mg by mouth daily. 30 tablet 3   levalbuterol (XOPENEX) 0.63 MG/3ML nebulizer solution Use 1 vial (0.63 mg total) by nebulization every 6 (six) hours as needed for wheezing or shortness of breath. 75 mL 12   morphine (MS CONTIN) 15 MG 12 hr tablet Take 1 tablet (15 mg total) by mouth every 12 (twelve) hours. Do Not Fill Before 08/25/2022 60 tablet 0   naloxone (NARCAN) nasal spray 4 mg/0.1 mL For possible accidentally overdose. 2 each 5   Oxycodone HCl 10 MG TABS Take 1 tablet (10 mg total) by mouth every 4 (four) hours as needed. Do Not Fill Before 08/25/2022 150 tablet 0   potassium chloride (KLOR-CON) 10 MEQ tablet Take 10 mEq by mouth 4 (four) times daily.     predniSONE (DELTASONE) 1 MG tablet Take 3 tablets (3 mg total) by mouth daily with breakfast. Take along with prednisone 5mg  tablet for a total of 8mg  daily. 90 tablet 0   predniSONE (DELTASONE) 5 MG tablet TAKE 1 TABLET(5 MG) BY MOUTH DAILY WITH BREAKFAST 90 tablet 0   tamsulosin (FLOMAX) 0.4 MG CAPS capsule Take 0.4 mg by mouth See admin instructions. Take 0.4 mg by mouth one to two times a day     Tiotropium Bromide Monohydrate (SPIRIVA RESPIMAT) 2.5 MCG/ACT AERS Inhale 2 puffs into the lungs daily. 4 g 6   traZODone (DESYREL) 50 MG tablet Take 0.5-1 tablets (25-50 mg total) by mouth at bedtime as needed for sleep. 30 tablet 3   triamcinolone cream (KENALOG) 0.1 % Apply 1 Application topically 2 (two)  times daily as needed (to itchy sites).     trolamine salicylate (ASPERCREME) 10 % cream Apply 1 Application topically 2 (two) times daily as needed (arthritis pain).     ferrous sulfate 325 (65 FE) MG EC tablet Take 1 tablet (325 mg total) by mouth daily with breakfast. 30 tablet 0   furosemide (LASIX) 20 MG tablet Take 1 tablet (20 mg total) by mouth daily. 90 tablet 3   pantoprazole (PROTONIX) 40 MG tablet Take 1 tablet (40 mg total) by mouth daily. (Patient taking differently: Take 40 mg by mouth daily as needed (for heartburn).) 30 tablet 2   No facility-administered medications prior to visit.   Allergies  Allergen Reactions   Tape Other (See Comments)    Tears skin. Prefers paper tape, Please   Celebrex [Celecoxib] Hives and Itching   Objective:   Today's Vitals   08/01/22 1316  BP: (!) 120/56  Pulse: (!) 58  Temp: 98 F (36.7 C)  TempSrc: Temporal  SpO2: 95%  Weight: 150 lb (68 kg)  Height: 5\' 8"  (1.727 m)   Body mass index is 22.81 kg/m.   General: Well developed, well nourished. No acute distress. HEENT: Normocephalic, non-traumatic. Mucous membranes moist. Oropharynx clear. There is some   bulging of the left buccal mucosa, but there eis no associated mass. There is tenderness along the left   lower gumline. Neck: Supple. No lymphadenopathy. No thyromegaly. Psych: Alert and oriented. Normal mood and affect.  Health Maintenance Due  Topic Date Due   Zoster Vaccines- Shingrix (1 of 2) Never done   Colonoscopy  03/22/2020   Lung Cancer Screening  08/18/2022   Lab Results    Latest Ref Rng & Units 06/26/2022    8:50 AM 02/19/2022    1:19 PM 01/25/2022    9:11 AM  CBC  WBC 4.0 - 10.5 K/uL 6.4  10.7  8.8   Hemoglobin 13.0 - 17.0 g/dL 9.9  9.9  9.1   Hematocrit 39.0 - 52.0 % 32.0  31.6  28.7   Platelets 150.0 - 400.0 K/uL 141.0  197  200.0        Latest Ref Rng & Units 06/26/2022    8:50 AM 02/19/2022    1:19 PM 01/25/2022    9:11 AM  CMP  Glucose 70 - 99  mg/dL 865  784  99   BUN 6 - 23 mg/dL 21  33  9   Creatinine 0.40 - 1.50 mg/dL 6.96  2.95  2.84   Sodium 135 - 145 mEq/L 138  135  141   Potassium 3.5 - 5.1 mEq/L 3.6  5.5  3.5   Chloride 96 - 112 mEq/L 101  100  103   CO2 19 - 32 mEq/L 28  27  30    Calcium 8.4 - 10.5 mg/dL 8.5  8.7  8.1   Total Protein 6.0 - 8.3 g/dL 5.5  5.7    Total Bilirubin 0.2 - 1.2 mg/dL 0.5  0.6    Alkaline Phos 39 - 117 U/L 127     AST 0 - 37 U/L 14  16    ALT 0 - 53 U/L 17  23     Last thyroid functions Lab Results  Component Value Date   TSH 3.25 06/26/2022   Iron/TIBC/Ferritin/ %Sat    Component Value Date/Time   IRON 25 (L) 06/26/2022 0850   TIBC 273 06/26/2022 0850   FERRITIN 98 06/26/2022 0850   IRONPCTSAT 9 (L) 06/26/2022 0850     Assessment & Plan:   Problem List Items Addressed This Visit       Other   Iron deficiency anemia    I suspect his anemia is multifactorial, however, he did have low iron stores. He is on an iron supplement now and he feels this has helped. I will reassess his CBC and iron panel in 3 months.      Chronic pain syndrome    Currently managed by pain specialist. I did point out that he is receiving 105 MME/day. Some tolerance is expecting, but he should be cautious in pushing for higher doses of opioids.      Malnutrition of mild degree [facility])    Mr. Schank protein levels are mildly low. I encouraged him about his dietary efforts. His weight is currently stable.      Other Visit Diagnoses     Numbness of lip    -  Primary   Likely due to gum irritation form poorly fitting dentures.   Pain in gums       Recommend he see his denturist regarding relining his current dentures.   History of cancer of lingual tonsil       No sign of recurrent cancer.       Return in about 3 months (around 11/01/2022) for Reassessment.   Loyola Mast, MD

## 2022-08-08 ENCOUNTER — Encounter: Payer: Self-pay | Admitting: Physician Assistant

## 2022-08-08 NOTE — Progress Notes (Unsigned)
Office Visit Note  Patient: Brandon [name]             Date of Birth: Sep 29, 1940           MRN: [id number]             PCP: Loyola Mast, MD Referring: Loyola Mast, MD Visit Date: 08/22/2022 Occupation: @GUAROCC @  Subjective:  Discuss DEXA results   History of Present Illness: Brandon [name] is a 82 y.o. male with history of polymyalgia rheumatica and osteoporosis. He is taking prednisone 8 mg by mouth daily.      Activities of Daily Living:  Patient reports morning stiffness for *** {minute/hour:19697}.   Patient {ACTIONS;DENIES/REPORTS:21021675::"Denies"} nocturnal pain.  Difficulty dressing/grooming: {ACTIONS;DENIES/REPORTS:21021675::"Denies"} Difficulty climbing stairs: {ACTIONS;DENIES/REPORTS:21021675::"Denies"} Difficulty getting out of chair: {ACTIONS;DENIES/REPORTS:21021675::"Denies"} Difficulty using hands for taps, buttons, cutlery, and/or writing: {ACTIONS;DENIES/REPORTS:21021675::"Denies"}  No Rheumatology ROS completed.   PMFS History:  Patient Active Problem List   Diagnosis Date Noted  . Malnutrition of mild degree (HCC) 08/01/2022  . Insomnia 06/26/2022  . Arthritis of left hand 06/26/2022  . Weight loss 06/26/2022  . Chronic gout of multiple sites 04/30/2022  . Acute exacerbation of chronic bronchitis (HCC) 04/03/2022  . Chronically on opiate therapy 02/12/2022  . Sacroiliac joint pain 02/12/2022  . At risk for polypharmacy 01/19/2022  . Aspiration pneumonia (HCC) 01/18/2022  . Trochanteric bursitis of left hip 01/02/2022  . Dysfunction of left eustachian tube 12/05/2021  . Hypotension 08/18/2021  . AKI (acute kidney injury) (HCC) 08/17/2021  . Hyponatremia 06/26/2021  . Hyperkalemia 06/26/2021  . Acute metabolic encephalopathy 06/26/2021  . Myoclonic jerking 06/26/2021  . Lumbar radiculopathy 06/08/2021  . Lumbar postlaminectomy syndrome 05/03/2021  . Degeneration of lumbar intervertebral disc 05/03/2021  . Lumbar spondylosis 05/03/2021  .  History of hip fracture 04/24/2021  . S/P abdominal aortic aneurysm repair 04/24/2021  . Chronic allergic rhinitis 01/24/2021  . Demand ischemia   . Acute respiratory failure with hypoxia (HCC)   . Elevated troponin   . Chronic diastolic heart failure (HCC) 12/03/2020  . Steatosis of liver 11/01/2020  . 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  . Hepatitis B core antibody positive 11/01/2020  . Benign prostatic hyperplasia 11/01/2020  . Chronic obstructive pulmonary disease (HCC) 07/20/2020  . History of placement of stent in LAD coronary artery 07/20/2020  . 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 TIA (transient ischemic attack)   . Esophageal stricture   . 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  . Primary osteoarthritis of both hands 01/11/2020  . Piriformis syndrome of both sides 09/23/2019  . Pain of both shoulder joints 07/29/2019  . Sciatica associated with disorder of lumbar spine 04/24/2019  . Primary osteoarthritis involving multiple joints 04/24/2019  . Chronic pain syndrome 04/24/2019  . Mixed anxiety and depressive disorder 04/15/2018  . 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 Iron deficiency anemia 07/10/2016  . Essential hypertension 05/19/2013  . Psoriasis 01/13/2009  . Coronary artery disease 09/15/2008  . Diverticulosis of colon 08/27/2007  . History of benign prostatic hyperplasia 08/27/2007  . Hyperlipidemia 03/19/2007  . Gastroesophageal reflux disease 03/19/2007    Past Medical History:  Diagnosis Date  . Abdominal aneurysm (HCC)   . Arthritis    "all over" (07/19/2016)  . BPH (benign prostatic hypertrophy)   . CAD (coronary artery disease)   . Chicken pox   . Chronic lower back pain    s/p  surgical fusion  . Depression   . Diverticulosis   . Esophageal stricture   . GERD (gastroesophageal reflux disease)   . Hepatitis B 1984  . Hiatal hernia    . History of radiation therapy 01/16/18- 03/05/18   Left Tonsil, 66 Gy in 33 fractions to high risk nodal echelons.   Marland Kitchen HLD (hyperlipidemia)   . HTN (hypertension)   . 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 Osteoarthritis   . 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 Excell Seltzer and Dr Laneta Simmers   . Severe aortic stenosis   . TIA (transient ischemic attack) 1990s   hx  . tonsillar ca dx'd 11/2017  . Tubular adenoma of colon 2009    Family History  Problem Relation Age of Onset  . Heart disease Father 46       Living  . Coronary artery disease Father        CABG  . Alzheimer's disease Mother 51       Deceased  . Arthritis Mother   . Aneurysm Brother   . Stomach cancer Maternal Uncle   . Brain cancer Maternal Aunt        x2  . Obesity Daughter        Had Bypass Sx   Past Surgical History:  Procedure Laterality Date  . ABDOMINAL AORTIC ENDOVASCULAR STENT GRAFT N/A 03/03/2021   Procedure: ABDOMINAL AORTIC ENDOVASCULAR STENT GRAFT;  Surgeon: Maeola Harman, MD;  Location: [facility] OR;  Service: Vascular;  Laterality: N/A;  . ABDOMINAL AORTOGRAM W/LOWER EXTREMITY N/A 04/10/2021   Procedure: ABDOMINAL AORTOGRAM W/LOWER EXTREMITY;  Surgeon: Maeola Harman, MD;  Location: [facility] INVASIVE CV LAB;  Service: Cardiovascular;  Laterality: N/A;  . BACK SURGERY    . CARDIAC CATHETERIZATION    . CATARACT EXTRACTION W/ INTRAOCULAR LENS  IMPLANT, BILATERAL Bilateral 01/2012 - 02/2012  . CORONARY ATHERECTOMY N/A 04/27/2020   Procedure: CORONARY ATHERECTOMY;  Surgeon: Tonny Bollman, MD;  Location: [facility] INVASIVE CV LAB;  Service: Cardiovascular;  Laterality: N/A;  . CORONARY IMAGING/OCT N/A 04/27/2020   Procedure: INTRAVASCULAR IMAGING/OCT;  Surgeon: Tonny Bollman, MD;  Location: [facility] INVASIVE CV LAB;  Service: Cardiovascular;  Laterality: N/A;  . CORONARY PRESSURE/FFR STUDY N/A 04/27/2020   Procedure:  INTRAVASCULAR PRESSURE WIRE/FFR STUDY;  Surgeon: Tonny Bollman, MD;  Location: [facility] INVASIVE CV LAB;  Service: Cardiovascular;  Laterality: N/A;  . CORONARY STENT INTERVENTION N/A 04/27/2020   Procedure: CORONARY STENT INTERVENTION;  Surgeon: Tonny Bollman, MD;  Location: [facility] INVASIVE CV LAB;  Service: Cardiovascular;  Laterality: N/A;  . DIRECT LARYNGOSCOPY Left 10/09/2017   Procedure: DIRECT LARYNGOSCOPY WITH BOPSY;  Surgeon: Flo Shanks, MD;  Location: [facility];  Service: ENT;  Laterality: Left;  . ESOPHAGOGASTRODUODENOSCOPY (EGD) WITH ESOPHAGEAL DILATION     "couple times" (07/19/2016)  . ESOPHAGOSCOPY Left 10/09/2017   Procedure: ESOPHAGOSCOPY;  Surgeon: Flo Shanks, MD;  Location: [facility];  Service: ENT;  Laterality: Left;  . IR GASTROSTOMY TUBE MOD SED  01/08/2018  . IR THORACENTESIS ASP PLEURAL SPACE W/IMG GUIDE  07/19/2016  . LAPAROSCOPIC CHOLECYSTECTOMY  1984  . LUMBAR DISC SURGERY  05/1996   L4-5; Dr. Newell Coral   . LUMBAR LAMINECTOMY/DECOMPRESSION MICRODISCECTOMY  10/2002   L3-4. Dr. Newell Coral  . MULTIPLE TOOTH EXTRACTIONS  1980s  . PARTIAL GLOSSECTOMY  12/02/2017   Dr. Hezzie Bump- [facility]  . pharyngoplasty for closure of tingue base defect  12/02/2017  Dr. Hezzie Bump- [facility]  . PICC LINE INSERTION  07/15/2016  . POSTERIOR LUMBAR FUSION  09/1996   Ray cage, L4-5 Dr. Jule Ser  . PROSTATE BIOPSY  ~ 2017  . radical tonsillectomy Left 12/02/2017   Dr. [facility]zzie [facility]ump at [facility]  . RIGHT HEART CATH AND CORONARY ANGIOGRAPHY N/A 03/31/2020   Procedure: RIGHT HEART CATH AND CORONARY ANGIOGRAPHY;  Surgeon: Laurey Morale, MD;  Location: [facility] INVASIVE CV LAB;  Service: Cardiovascular;  Laterality: N/A;  . RIGID BRONCHOSCOPY Left 10/09/2017   Procedure: RIGID BRONCHOSCOPY;  Surgeon: Flo Shanks, MD;  Location: [facility];  Service: ENT;  Laterality: Left;  . TEE WITHOUT CARDIOVERSION N/A 06/07/2020   Procedure: TRANSESOPHAGEAL ECHOCARDIOGRAM  (TEE);  Surgeon: Tonny Bollman, MD;  Location: [place] OR;  Service: Open Heart Surgery;  Laterality: N/A;  . TONSILLECTOMY    . TOTAL HIP ARTHROPLASTY Left 12/04/2020   Procedure: TOTAL HIP ARTHROPLASTY ANTERIOR APPROACH;  Surgeon: Samson Frederic, MD;  Location: [facility];  Service: Orthopedics;  Laterality: Left;  . TOTAL HIP ARTHROPLASTY Left 11/2020  . TRACHEOSTOMY  12/02/2017   Dr. Hezzie Bump- [facility]  . ULTRASOUND GUIDANCE FOR VASCULAR ACCESS Right 06/07/2020   Procedure: ULTRASOUND GUIDANCE FOR VASCULAR ACCESS;  Surgeon: Tonny Bollman, MD;  Location: [facility] OR;  Service: Open Heart Surgery;  Laterality: Right;  . ULTRASOUND GUIDANCE FOR VASCULAR ACCESS Bilateral 03/03/2021   Procedure: ULTRASOUND GUIDANCE FOR VASCULAR ACCESS, BILATERAL FEMORAL ARTERIES;  Surgeon: Maeola Harman, MD;  Location: [facility] OR;  Service: Vascular;  Laterality: Bilateral;  . VASCULAR SURGERY     Social History   Social History Narrative   ** Merged History Encounter **       Married (3rd), [place]. 2 children from 1st marriage, 4 step children.    Retired on disability due to back    Former Runner, broadcasting/film/video.   restores antique furniture for a hobby.       Cell # [id number]   Immunization History  Administered Date(s) Administered  . Fluad Quad(high Dose 65+) 10/15/2018, 11/06/2019, 10/20/2020  . Influenza Split 11/26/2010, 10/11/2011, 11/03/2012  . Influenza Whole 11/14/2006, 12/04/2007, 10/06/2008, 10/25/2009, 11/26/2021  . Influenza, High Dose Seasonal PF 11/01/2016, 10/30/2017  . Influenza, Seasonal, Injecte, Preservative Fre 12/07/2014  . Influenza,inj,Quad PF,6+ Mos 11/04/2015  . Influenza-Unspecified 11/19/2013  . PFIZER(Purple Top)SARS-COV-2 Vaccination 02/25/2019, 03/18/2019, 11/16/2019  . Pneumococcal Conjugate-13 12/07/2014  . Pneumococcal Polysaccharide-23 05/30/2017  . Tdap 01/28/2020     Objective: Vital Signs: There were no vitals taken for this visit.   Physical  Exam Vitals and nursing note reviewed.  Constitutional:      Appearance: He is well-developed.  HENT:     Head: Normocephalic and atraumatic.  Eyes:     Conjunctiva/sclera: Conjunctivae normal.     Pupils: Pupils are equal, round, and reactive to light.  Cardiovascular:     Rate and Rhythm: Normal rate and regular rhythm.     Heart sounds: Normal heart sounds.  Pulmonary:     Effort: Pulmonary effort is normal.     Breath sounds: Normal breath sounds.  Abdominal:     General: Bowel sounds are normal.     Palpations: Abdomen is soft.  Musculoskeletal:     Cervical back: Normal range of motion and neck supple.  Skin:    General: Skin is warm and dry.     Capillary Refill: Capillary refill takes less than 2 seconds.  Neurological:     Mental Status: He is alert and oriented to  person, place, and time.  Psychiatric:        Behavior: Behavior normal.     Musculoskeletal Exam: ***  CDAI Exam: CDAI Score: -- Patient Global: --; Provider Global: -- Swollen: --; Tender: -- Joint Exam 08/22/2022   No joint exam has been documented for this visit   There is currently no information documented on the homunculus. Go to the Rheumatology activity and complete the homunculus joint exam.  Investigation: No additional findings.  Imaging: No results found.  Recent Labs: Lab Results  Component Value Date   WBC 6.4 06/26/2022   HGB 9.9 (L) 06/26/2022   PLT 141.0 (L) 06/26/2022   NA 138 06/26/2022   K 3.6 06/26/2022   CL 101 06/26/2022   CO2 28 06/26/2022   GLUCOSE 115 (H) 06/26/2022   BUN 21 06/26/2022   CREATININE 0.82 06/26/2022   BILITOT 0.5 06/26/2022   ALKPHOS 127 (H) 06/26/2022   AST 14 06/26/2022   ALT 17 06/26/2022   PROT 5.5 (L) 06/26/2022   ALBUMIN 3.4 (L) 06/26/2022   CALCIUM 8.5 06/26/2022   GFRAA 107 08/11/2020   QFTBGOLDPLUS NEGATIVE 01/11/2020    Speciality Comments:  Fosamax started on January 26, 2021.  If he has dysphagia with Fosamax or any GI  intolerance we will have to switch him to Reclast.  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  No orders of the defined types were placed in this encounter.   Face-to-face time spent with patient was *** minutes. Greater than 50% of time was spent in counseling and coordination of care.  Follow-Up Instructions: No follow-ups on file.   Gearldine Bienenstock, PA-C  Note - This record has been created using Dragon software.  Chart creation errors have been sought, but may not always  have been located. Such creation errors do not reflect on  the standard of medical care.

## 2022-08-22 ENCOUNTER — Other Ambulatory Visit: Payer: Self-pay

## 2022-08-22 ENCOUNTER — Encounter: Payer: Self-pay | Admitting: Physician Assistant

## 2022-08-22 ENCOUNTER — Telehealth: Payer: Self-pay | Admitting: Pharmacist

## 2022-08-22 ENCOUNTER — Ambulatory Visit: Payer: Medicare Other | Attending: Physician Assistant | Admitting: Physician Assistant

## 2022-08-22 VITALS — BP 101/53 | HR 57 | Resp 14 | Ht 68.0 in | Wt 146.0 lb

## 2022-08-22 DIAGNOSIS — R5383 Other fatigue: Secondary | ICD-10-CM | POA: Diagnosis not present

## 2022-08-22 DIAGNOSIS — M810 Age-related osteoporosis without current pathological fracture: Secondary | ICD-10-CM | POA: Diagnosis not present

## 2022-08-22 DIAGNOSIS — M19042 Primary osteoarthritis, left hand: Secondary | ICD-10-CM

## 2022-08-22 DIAGNOSIS — Z9889 Other specified postprocedural states: Secondary | ICD-10-CM

## 2022-08-22 DIAGNOSIS — Z8639 Personal history of other endocrine, nutritional and metabolic disease: Secondary | ICD-10-CM

## 2022-08-22 DIAGNOSIS — F419 Anxiety disorder, unspecified: Secondary | ICD-10-CM

## 2022-08-22 DIAGNOSIS — Z8719 Personal history of other diseases of the digestive system: Secondary | ICD-10-CM

## 2022-08-22 DIAGNOSIS — F32A Depression, unspecified: Secondary | ICD-10-CM

## 2022-08-22 DIAGNOSIS — L409 Psoriasis, unspecified: Secondary | ICD-10-CM

## 2022-08-22 DIAGNOSIS — Z87891 Personal history of nicotine dependence: Secondary | ICD-10-CM

## 2022-08-22 DIAGNOSIS — M19041 Primary osteoarthritis, right hand: Secondary | ICD-10-CM

## 2022-08-22 DIAGNOSIS — I7143 Infrarenal abdominal aortic aneurysm, without rupture: Secondary | ICD-10-CM

## 2022-08-22 DIAGNOSIS — Z96642 Presence of left artificial hip joint: Secondary | ICD-10-CM | POA: Diagnosis not present

## 2022-08-22 DIAGNOSIS — I10 Essential (primary) hypertension: Secondary | ICD-10-CM

## 2022-08-22 DIAGNOSIS — Z7952 Long term (current) use of systemic steroids: Secondary | ICD-10-CM

## 2022-08-22 DIAGNOSIS — Z952 Presence of prosthetic heart valve: Secondary | ICD-10-CM

## 2022-08-22 DIAGNOSIS — M1A0210 Idiopathic chronic gout, right elbow, without tophus (tophi): Secondary | ICD-10-CM

## 2022-08-22 DIAGNOSIS — M5136 Other intervertebral disc degeneration, lumbar region: Secondary | ICD-10-CM

## 2022-08-22 DIAGNOSIS — Z862 Personal history of diseases of the blood and blood-forming organs and certain disorders involving the immune mechanism: Secondary | ICD-10-CM

## 2022-08-22 DIAGNOSIS — Z5181 Encounter for therapeutic drug level monitoring: Secondary | ICD-10-CM

## 2022-08-22 DIAGNOSIS — M8589 Other specified disorders of bone density and structure, multiple sites: Secondary | ICD-10-CM

## 2022-08-22 DIAGNOSIS — E559 Vitamin D deficiency, unspecified: Secondary | ICD-10-CM

## 2022-08-22 DIAGNOSIS — C090 Malignant neoplasm of tonsillar fossa: Secondary | ICD-10-CM

## 2022-08-22 DIAGNOSIS — K750 Abscess of liver: Secondary | ICD-10-CM

## 2022-08-22 DIAGNOSIS — G894 Chronic pain syndrome: Secondary | ICD-10-CM

## 2022-08-22 DIAGNOSIS — M353 Polymyalgia rheumatica: Secondary | ICD-10-CM | POA: Diagnosis not present

## 2022-08-22 DIAGNOSIS — R1312 Dysphagia, oropharyngeal phase: Secondary | ICD-10-CM

## 2022-08-22 MED ORDER — PREDNISONE 5 MG PO TABS: ORAL_TABLET | ORAL | 0 refills | Status: DC

## 2022-08-22 MED ORDER — PREDNISONE 1 MG PO TABS: 3.0000 mg | ORAL_TABLET | Freq: Every day | ORAL | 0 refills | Status: DC

## 2022-08-22 NOTE — Telephone Encounter (Addendum)
Referral to [employer] will be placed pending labs from today's office visit  Chesley Mires, PharmD, MPH, BCPS, CPP Clinical Pharmacist (Rheumatology and Pulmonology)  ----- Message from [facility] Marissa G sent at 08/22/2022  2:09 PM EDT ----- Please apply for IV Reclast per Sherron Ales, PA-C. Thanks!

## 2022-08-22 NOTE — Telephone Encounter (Signed)
Please review pended prednisone refills (8mg - 2 month supply) and send to the pharmacy. Thanks!

## 2022-08-23 NOTE — Progress Notes (Signed)
Glucose is 192. Total protein is borderline low-6.0 but has improved.  Rest of CMP WNL Anemia is improving. Absolute lymphocytes are borderline low-we will continue to monitor.  Vitamin D WNL TSH WNL

## 2022-08-23 NOTE — Progress Notes (Signed)
PTH WNL SPEP pending

## 2022-08-24 LAB — CBC WITH DIFFERENTIAL/PLATELET
Eosinophils Relative: 3.4 %
HCT: 37.8 % — ABNORMAL LOW (ref 38.5–50.0)
MCH: 25.6 pg — ABNORMAL LOW (ref 27.0–33.0)
MPV: 10.3 fL (ref 7.5–12.5)
Monocytes Relative: 5.4 %
Platelets: 142 10*3/uL (ref 140–400)
RBC: 4.5 10*6/uL (ref 4.20–5.80)
RDW: 14.9 % (ref 11.0–15.0)
WBC: 8.9 10*3/uL (ref 3.8–10.8)

## 2022-08-24 LAB — COMPLETE METABOLIC PANEL WITH GFR
ALT: 19 U/L (ref 9–46)
CO2: 31 mmol/L (ref 20–32)
Calcium: 8.8 mg/dL (ref 8.6–10.3)
Chloride: 102 mmol/L (ref 98–110)
Creat: 0.72 mg/dL (ref 0.70–1.28)
Globulin: 2.2 g/dL (calc) (ref 1.9–3.7)
Glucose, Bld: 192 mg/dL — ABNORMAL HIGH (ref 65–99)
Potassium: 3.5 mmol/L (ref 3.5–5.3)
Sodium: 140 mmol/L (ref 135–146)
Total Protein: 6 g/dL — ABNORMAL LOW (ref 6.1–8.1)

## 2022-08-24 LAB — PROTEIN ELECTROPHORESIS, SERUM, WITH REFLEX
Alpha 1: 0.5 g/dL — ABNORMAL HIGH (ref 0.2–0.3)
Alpha 2: 0.9 g/dL (ref 0.5–0.9)
Beta 2: 0.3 g/dL (ref 0.2–0.5)
Beta Globulin: 0.4 g/dL (ref 0.4–0.6)
Gamma Globulin: 0.5 g/dL — ABNORMAL LOW (ref 0.8–1.7)
Total Protein: 5.9 g/dL — ABNORMAL LOW (ref 6.1–8.1)

## 2022-08-24 LAB — TSH: TSH: 1.49 mIU/L (ref 0.40–4.50)

## 2022-08-29 LAB — COMPLETE METABOLIC PANEL WITH GFR
AG Ratio: 1.7 (calc) (ref 1.0–2.5)
AST: 18 U/L (ref 10–35)
Albumin: 3.8 g/dL (ref 3.6–5.1)
Alkaline phosphatase (APISO): 120 U/L (ref 35–144)
BUN: 19 mg/dL (ref 7–25)
Total Bilirubin: 0.4 mg/dL (ref 0.2–1.2)
eGFR: 93 mL/min/{1.73_m2} (ref >=60)

## 2022-08-29 LAB — CBC WITH DIFFERENTIAL/PLATELET
Absolute Monocytes: 481 cells/uL (ref 200–950)
Basophils Absolute: 9 cells/uL (ref 0–200)
Basophils Relative: 0.1 %
Eosinophils Absolute: 303 cells/uL (ref 15–500)
Hemoglobin: 11.5 g/dL — ABNORMAL LOW (ref 13.2–17.1)
Lymphs Abs: 721 cells/uL — ABNORMAL LOW (ref 850–3900)
MCHC: 30.4 g/dL — ABNORMAL LOW (ref 32.0–36.0)
MCV: 84 fL (ref 80.0–100.0)
Neutro Abs: 7387 cells/uL (ref 1500–7800)
Neutrophils Relative %: 83 %
Total Lymphocyte: 8.1 %

## 2022-08-29 LAB — PROTEIN ELECTROPHORESIS, SERUM, WITH REFLEX: Albumin ELP: 3.3 g/dL — ABNORMAL LOW (ref 3.8–4.8)

## 2022-08-29 LAB — VITAMIN D 25 HYDROXY (VIT D DEFICIENCY, FRACTURES): Vit D, 25-Hydroxy: 45 ng/mL (ref 30–100)

## 2022-08-29 LAB — PARATHYROID HORMONE, INTACT (NO CA): PTH: 66 pg/mL (ref 16–77)

## 2022-08-29 LAB — IFE INTERPRETATION

## 2022-08-29 NOTE — Progress Notes (Signed)
IFE normal pattern

## 2022-08-31 ENCOUNTER — Telehealth: Payer: Self-pay | Admitting: Pharmacy Technician

## 2022-08-31 ENCOUNTER — Other Ambulatory Visit: Payer: Self-pay | Admitting: Pharmacist

## 2022-08-31 DIAGNOSIS — M810 Age-related osteoporosis without current pathological fracture: Secondary | ICD-10-CM | POA: Insufficient documentation

## 2022-08-31 NOTE — Telephone Encounter (Signed)
Devki, Fyi note:  Patient will be scheduled as soon as possible.  Auth Submission: NO AUTH NEEDED Site of care: Site of care: [facility] Payer: [employer] [place] Medication & CPT/J Code(s) submitted: Reclast (Zolendronic acid) [id number] Route of submission (phone, fax, portal):  Phone # Fax # Auth type: Buy/Bill Units/visits requested: X1 Reference number:  Approval from: 08/31/22 to 02/05/23

## 2022-08-31 NOTE — Progress Notes (Signed)
Therapy plan placed for Reclast for [facility] to start benefits investigation  Diagnosis: age-related osteoporosis CPT Codes: Reclast - L2440  Provider: Sherron Ales, PA-C  Dose: 5mg  IV every 12 months  Last Clinic Visit: 08/22/22 Next Clinic Visit: 10/23/22  Pertinent Labs: CMP stable, CBC stable, TSH wnl, Vitamin D wnl, PTH wnl  Chesley Mires, PharmD, MPH, BCPS, CPP Clinical Pharmacist (Rheumatology and Pulmonology)

## 2022-08-31 NOTE — Telephone Encounter (Signed)
Baseline labs stable to proceed with Reclast. Therapy plan placed with [facility] for Reclast  Chesley Mires, PharmD, MPH, BCPS, CPP Clinical Pharmacist (Rheumatology and Pulmonology)

## 2022-09-06 NOTE — Progress Notes (Signed)
Reclast scheduled for 09/07/2022.  Chesley Mires, PharmD, MPH, BCPS, CPP Clinical Pharmacist (Rheumatology and Pulmonology)

## 2022-09-07 ENCOUNTER — Ambulatory Visit: Payer: Medicare Other

## 2022-09-07 MED ORDER — ZOLEDRONIC ACID 5 MG/100ML IV SOLN: 5.0000 mg | Freq: Once | INTRAVENOUS | Status: DC

## 2022-09-07 MED ORDER — DIPHENHYDRAMINE HCL 25 MG PO CAPS: 25.0000 mg | ORAL_CAPSULE | Freq: Once | ORAL | Status: DC

## 2022-09-07 MED ORDER — SODIUM CHLORIDE 0.9 % IV SOLN: INTRAVENOUS | Status: DC

## 2022-09-07 MED ORDER — ACETAMINOPHEN 325 MG PO TABS: 650.0000 mg | ORAL_TABLET | Freq: Once | ORAL | Status: DC

## 2022-09-07 NOTE — Progress Notes (Signed)
Patient called and canceled due to emergency.

## 2022-09-11 ENCOUNTER — Ambulatory Visit (INDEPENDENT_AMBULATORY_CARE_PROVIDER_SITE_OTHER): Payer: Medicare Other

## 2022-09-11 VITALS — BP 143/78 | HR 65 | Temp 97.8°F | Resp 16 | Ht 68.0 in | Wt 148.4 lb

## 2022-09-11 DIAGNOSIS — M961 Postlaminectomy syndrome, not elsewhere classified: Secondary | ICD-10-CM | POA: Diagnosis not present

## 2022-09-11 DIAGNOSIS — Z8781 Personal history of (healed) traumatic fracture: Secondary | ICD-10-CM

## 2022-09-11 DIAGNOSIS — M810 Age-related osteoporosis without current pathological fracture: Secondary | ICD-10-CM | POA: Diagnosis not present

## 2022-09-11 MED ORDER — DIPHENHYDRAMINE HCL 25 MG PO CAPS
25.0000 mg | ORAL_CAPSULE | Freq: Once | ORAL | Status: AC
  Administered 2022-09-11: 25 mg via ORAL
  Filled 2022-09-11: qty 1

## 2022-09-11 MED ORDER — ZOLEDRONIC ACID 5 MG/100ML IV SOLN
5.0000 mg | Freq: Once | INTRAVENOUS | Status: AC
  Administered 2022-09-11: 5 mg via INTRAVENOUS
  Filled 2022-09-11: qty 100

## 2022-09-11 MED ORDER — ACETAMINOPHEN 325 MG PO TABS
650.0000 mg | ORAL_TABLET | Freq: Once | ORAL | Status: AC
  Administered 2022-09-11: 650 mg via ORAL
  Filled 2022-09-11: qty 2

## 2022-09-11 NOTE — Patient Instructions (Signed)

## 2022-09-11 NOTE — Progress Notes (Signed)
Diagnosis: Osteoporosis  Provider:  Chilton Greathouse MD  Procedure: IV Infusion  IV Type: Peripheral, IV Location: L Antecubital  Reclast (Zolendronic Acid), Dose: 5 mg  Infusion Start Time: 1447  Infusion Stop Time: 1521  Post Infusion IV Care: Observation period completed. PIV removed  Discharge: Condition: Good, Destination: Home . AVS Provided  Performed by:  Rico Ala, LPN

## 2022-09-12 ENCOUNTER — Ambulatory Visit: Payer: Medicare Other | Admitting: Physician Assistant

## 2022-09-18 ENCOUNTER — Other Ambulatory Visit: Payer: Self-pay | Admitting: Family Medicine

## 2022-09-18 DIAGNOSIS — J449 Chronic obstructive pulmonary disease, unspecified: Secondary | ICD-10-CM

## 2022-09-24 ENCOUNTER — Other Ambulatory Visit (HOSPITAL_COMMUNITY): Payer: Self-pay | Admitting: Cardiology

## 2022-09-24 ENCOUNTER — Encounter: Payer: Medicare Other | Attending: Physical Medicine and Rehabilitation | Admitting: Registered Nurse

## 2022-09-24 ENCOUNTER — Encounter: Payer: Self-pay | Admitting: Registered Nurse

## 2022-09-24 VITALS — BP 121/63 | HR 63 | Ht 68.0 in | Wt 146.0 lb

## 2022-09-24 DIAGNOSIS — M5416 Radiculopathy, lumbar region: Secondary | ICD-10-CM

## 2022-09-24 DIAGNOSIS — G894 Chronic pain syndrome: Secondary | ICD-10-CM

## 2022-09-24 DIAGNOSIS — M47816 Spondylosis without myelopathy or radiculopathy, lumbar region: Secondary | ICD-10-CM

## 2022-09-24 DIAGNOSIS — Z79891 Long term (current) use of opiate analgesic: Secondary | ICD-10-CM | POA: Diagnosis not present

## 2022-09-24 DIAGNOSIS — Z5181 Encounter for therapeutic drug level monitoring: Secondary | ICD-10-CM | POA: Diagnosis not present

## 2022-09-24 MED ORDER — OXYCODONE HCL 10 MG PO TABS: 10.0000 mg | ORAL_TABLET | ORAL | 0 refills | Status: DC | PRN

## 2022-09-24 MED ORDER — MORPHINE SULFATE ER 15 MG PO TBCR: 15.0000 mg | EXTENDED_RELEASE_TABLET | Freq: Two times a day (BID) | ORAL | 0 refills | Status: DC

## 2022-09-24 NOTE — Progress Notes (Unsigned)
Subjective:    Patient ID: Brandon [name], male    DOB: [birth year], 82 y.o.   MRN: [id number]  HPI: Brandon [name] is a 82 y.o. male who returns for follow up appointment for chronic pain and medication refill. states *** pain is located in  ***. rates pain ***. current exercise regime is walking and performing stretching exercises.  Mr. Chretien Morphine equivalent is *** MME.   Last UDS was Performed on 05/16/2022, it was consistent.     Pain Inventory Average Pain 5 Pain Right Now 5 My pain is dull, stabbing, and aching  In the last 24 hours, has pain interfered with the following? General activity 5 Relation with others 5 Enjoyment of life 5 What TIME of day is your pain at its worst? morning  Sleep (in general) Poor  Pain is worse with: walking, bending, and standing Pain improves with: rest and medication Relief from Meds: 7  Family History  Problem Relation Age of Onset   Heart disease Father 24       Living   Coronary artery disease Father        CABG   Alzheimer's disease Mother 44       Deceased   Arthritis Mother    Aneurysm Brother    Stomach cancer Maternal Uncle    Brain cancer Maternal Aunt        x2   Obesity Daughter        Had Bypass Sx   Social History   Socioeconomic History   Marital status: Married    Spouse name: Brandon [name]   Number of children: 2   Years of education: Not on file   Highest education level: Not on file  Occupational History   Occupation: retired    Associate Professor: RETIRED    Comment: disabled due to back problems  Tobacco Use   Smoking status: Former    Current packs/day: 0.00    Average packs/day: 1.5 packs/day for 50.0 years (75.0 ttl pk-yrs)    Types: Cigarettes    Start date: 09/06/1967    Quit date: 09/05/2017    Years since quitting: 5.0    Passive exposure: Never   Smokeless tobacco: Never   Tobacco comments:    smoked less than 1 ppd for 40+ years;   Vaping Use   Vaping status: Never Used  Substance and Sexual Activity    Alcohol use: Yes    Comment: for occassions   Drug use: Yes    Types: Oxycodone, Morphine    Comment: has prescription   Sexual activity: Not Currently  Other Topics Concern   Not on file  Social History Narrative   ** Merged History Encounter **       Married (3rd), [place]. 2 children from 1st marriage, 4 step children.    Retired on disability due to back    Former Runner, broadcasting/film/video.   restores antique furniture for a hobby.       Cell # [phone]   Social Determinants of Health   Financial Resource Strain: Low Risk  (06/15/2022)   Overall Financial Resource Strain (CARDIA)    Difficulty of Paying Living Expenses: Not hard at all  Food Insecurity: No Food Insecurity (06/15/2022)   Hunger Vital Sign    Worried About Running Out of Food in the Last Year: Never true    Ran Out of Food in the Last Year: Never true  Transportation Needs: No Transportation Needs (06/15/2022)   PRAPARE -  Administrator, Civil Service (Medical): No    Lack of Transportation (Non-Medical): No  Physical Activity: Inactive (06/15/2022)   Exercise Vital Sign    Days of Exercise per Week: 0 days    Minutes of Exercise per Session: 0 min  Stress: No Stress Concern Present (06/15/2022)   [employer] of Occupational Health - Occupational Stress Questionnaire    Feeling of Stress : Not at all  Social Connections: Moderately Integrated (06/07/2021)   Social Connection and Isolation Panel [NHANES]    Frequency of Communication with Friends and Family: Twice a week    Frequency of Social Gatherings with Friends and Family: Twice a week    Attends Religious Services: More than 4 times per year    Active Member of [employer] or Organizations: No    Attends Banker Meetings: Never    Marital Status: Married   Past Surgical History:  Procedure Laterality Date   ABDOMINAL AORTIC ENDOVASCULAR STENT GRAFT N/A 03/03/2021   Procedure: ABDOMINAL AORTIC ENDOVASCULAR STENT  GRAFT;  Surgeon: Brandon Harman, MD;  Location: [facility] OR;  Service: Vascular;  Laterality: N/A;   ABDOMINAL AORTOGRAM W/LOWER EXTREMITY N/A 04/10/2021   Procedure: ABDOMINAL AORTOGRAM W/LOWER EXTREMITY;  Surgeon: Brandon Harman, MD;  Location: [facility]VASIVE CV LAB;  Service: Cardiovascular;  Laterality: N/A;   BACK SURGERY     CARDIAC CATHETERIZATION     CATARACT EXTRACTION W/ INTRAOCULAR LENS  IMPLANT, BILATERAL Bilateral 01/2012 - 02/2012   CORONARY ATHERECTOMY N/A 04/27/2020   Procedure: CORONARY ATHERECTOMY;  Surgeon: Brandon Bollman, MD;  Location: [facility] INVASIVE CV LAB;  Service: Cardiovascular;  Laterality: N/A;   CORONARY IMAGING/OCT N/A 04/27/2020   Procedure: INTRAVASCULAR IMAGING/OCT;  Surgeon: Brandon Bollman, MD;  Location: [facility] INVASIVE CV LAB;  Service: Cardiovascular;  Laterality: N/A;   CORONARY PRESSURE/FFR STUDY N/A 04/27/2020   Procedure: INTRAVASCULAR PRESSURE WIRE/FFR STUDY;  Surgeon: Brandon Bollman, MD;  Location: [facility] INVASIVE CV LAB;  Service: Cardiovascular;  Laterality: N/A;   CORONARY STENT INTERVENTION N/A 04/27/2020   Procedure: CORONARY STENT INTERVENTION;  Surgeon: Brandon Bollman, MD;  Location: [facility] INVASIVE CV LAB;  Service: Cardiovascular;  Laterality: N/A;   DIRECT LARYNGOSCOPY Left 10/09/2017   Procedure: DIRECT LARYNGOSCOPY WITH BOPSY;  Surgeon: Brandon Shanks, MD;  Location: [facility];  Service: ENT;  Laterality: Left;   ESOPHAGOGASTRODUODENOSCOPY (EGD) WITH ESOPHAGEAL DILATION     "couple times" (07/19/2016)   ESOPHAGOSCOPY Left 10/09/2017   Procedure: ESOPHAGOSCOPY;  Surgeon: Brandon Shanks, MD;  Location: [facility];  Service: ENT;  Laterality: Left;   IR GASTROSTOMY TUBE MOD SED  01/08/2018   IR THORACENTESIS ASP PLEURAL SPACE W/IMG GUIDE  07/19/2016   LAPAROSCOPIC CHOLECYSTECTOMY  1984   LUMBAR DISC SURGERY  05/1996   L4-5; Dr. Rosanne [name] LAMINECTOMY/DECOMPRESSION MICRODISCECTOMY  10/2002   L3-4. Dr.  Newell [name]   MULTIPLE TOOTH EXTRACTIONS  1980s   PARTIAL GLOSSECTOMY  12/02/2017   Dr. Hezzie Bump- [facility]   pharyngoplasty for closure of tingue base defect  12/02/2017   Dr. Hezzie [facility]ump- [facility]   PICC LINE INSERTION  07/15/2016   POSTERIOR LUMBAR FUSION  09/1996   Ray cage, L4-5 Dr. Jule Ser   PROSTATE BIOPSY  ~ 2017   radical tonsillectomy Left 12/02/2017   Dr. [facility]zzie [facility]ump at [facility]   RIGHT HEART CATH AND CORONARY ANGIOGRAPHY N/A 03/31/2020   Procedure: RIGHT HEART CATH AND CORONARY ANGIOGRAPHY;  Surgeon: Laurey Morale, MD;  Location: [facility] INVASIVE CV LAB;  Service: Cardiovascular;  Laterality: N/A;   RIGID BRONCHOSCOPY Left 10/09/2017   Procedure: RIGID BRONCHOSCOPY;  Surgeon: Brandon Shanks, MD;  Location: [facility];  Service: ENT;  Laterality: Left;   TEE WITHOUT CARDIOVERSION N/A 06/07/2020   Procedure: TRANSESOPHAGEAL ECHOCARDIOGRAM (TEE);  Surgeon: Brandon Bollman, MD;  Location: [facility] OR;  Service: Open Heart Surgery;  Laterality: N/A;   TONSILLECTOMY     TOTAL HIP ARTHROPLASTY Left 12/04/2020   Procedure: TOTAL HIP ARTHROPLASTY ANTERIOR APPROACH;  Surgeon: Samson Frederic, MD;  Location: [facility]S;  Service: Orthopedics;  Laterality: Left;   TOTAL HIP ARTHROPLASTY Left 11/2020   TRACHEOSTOMY  12/02/2017   Dr. Hezzie Bump- [facility]   ULTRASOUND GUIDANCE FOR VASCULAR ACCESS Right 06/07/2020   Procedure: ULTRASOUND GUIDANCE FOR VASCULAR ACCESS;  Surgeon: Brandon Bollman, MD;  Location: [facility] OR;  Service: Open Heart Surgery;  Laterality: Right;   ULTRASOUND GUIDANCE FOR VASCULAR ACCESS Bilateral 03/03/2021   Procedure: ULTRASOUND GUIDANCE FOR VASCULAR ACCESS, BILATERAL FEMORAL ARTERIES;  Surgeon: Brandon Harman, MD;  Location: [facility] OR;  Service: Vascular;  Laterality: Bilateral;   VASCULAR SURGERY     Past Surgical History:  Procedure Laterality Date   ABDOMINAL AORTIC ENDOVASCULAR STENT GRAFT N/A 03/03/2021   Procedure: ABDOMINAL AORTIC ENDOVASCULAR STENT GRAFT;  Surgeon: Brandon Harman, MD;  Location: [facility] OR;  Service: Vascular;  Laterality: N/A;   ABDOMINAL AORTOGRAM W/LOWER EXTREMITY N/A 04/10/2021   Procedure: ABDOMINAL AORTOGRAM W/LOWER EXTREMITY;  Surgeon: Brandon Harman, MD;  Location: [facility] INVASIVE CV LAB;  Service: Cardiovascular;  Laterality: N/A;   BACK SURGERY     CARDIAC CATHETERIZATION     CATARACT EXTRACTION W/ INTRAOCULAR LENS  IMPLANT, BILATERAL Bilateral 01/2012 - 02/2012   CORONARY ATHERECTOMY N/A 04/27/2020   Procedure: CORONARY ATHERECTOMY;  Surgeon: Brandon Bollman, MD;  Location: [facility] INVASIVE CV LAB;  Service: Cardiovascular;  Laterality: N/A;   CORONARY IMAGING/OCT N/A 04/27/2020   Procedure: INTRAVASCULAR IMAGING/OCT;  Surgeon: Brandon Bollman, MD;  Location: [facility] INVASIVE CV LAB;  Service: Cardiovascular;  Laterality: N/A;   CORONARY PRESSURE/FFR STUDY N/A 04/27/2020   Procedure: INTRAVASCULAR PRESSURE WIRE/FFR STUDY;  Surgeon: Brandon Bollman, MD;  Location: [facility] INVASIVE CV LAB;  Service: Cardiovascular;  Laterality: N/A;   CORONARY STENT INTERVENTION N/A 04/27/2020   Procedure: CORONARY STENT INTERVENTION;  Surgeon: Brandon Bollman, MD;  Location: [facility] INVASIVE CV LAB;  Service: Cardiovascular;  Laterality: N/A;   DIRECT LARYNGOSCOPY Left 10/09/2017   Procedure: DIRECT LARYNGOSCOPY WITH BOPSY;  Surgeon: Brandon Shanks, MD;  Location: [facility];  Service: ENT;  Laterality: Left;   ESOPHAGOGASTRODUODENOSCOPY (EGD) WITH ESOPHAGEAL DILATION     "couple times" (07/19/2016)   ESOPHAGOSCOPY Left 10/09/2017   Procedure: ESOPHAGOSCOPY;  Surgeon: Brandon Shanks, MD;  Location: [facility];  Service: ENT;  Laterality: Left;   IR GASTROSTOMY TUBE MOD SED  01/08/2018   IR THORACENTESIS ASP PLEURAL SPACE W/IMG GUIDE  07/19/2016   LAPAROSCOPIC CHOLECYSTECTOMY  1984   LUMBAR DISC SURGERY  05/1996   L4-5; Dr. Rosanne [name] LAMINECTOMY/DECOMPRESSION MICRODISCECTOMY  10/2002   L3-4. Dr. Newell [name]   MULTIPLE  TOOTH EXTRACTIONS  1980s   PARTIAL GLOSSECTOMY  12/02/2017   Dr. Hezzie Bump- [facility]   pharyngoplasty for closure of tingue base defect  12/02/2017   Dr. Hezzie Bump- [facility]   PICC LINE INSERTION  07/15/2016   POSTERIOR LUMBAR FUSION  09/1996   Ray cage, L4-5 Dr. Jule Ser   PROSTATE BIOPSY  ~ 2017   radical tonsillectomy Left 12/02/2017  Dr. [facility]zzie [facility]ump at [facility]   RIGHT HEART CATH AND CORONARY ANGIOGRAPHY N/A 03/31/2020   Procedure: RIGHT HEART CATH AND CORONARY ANGIOGRAPHY;  Surgeon: Laurey Morale, MD;  Location: [facility] INVASIVE CV LAB;  Service: Cardiovascular;  Laterality: N/A;   RIGID BRONCHOSCOPY Left 10/09/2017   Procedure: RIGID BRONCHOSCOPY;  Surgeon: Brandon Shanks, MD;  Location: [facility];  Service: ENT;  Laterality: Left;   TEE WITHOUT CARDIOVERSION N/A 06/07/2020   Procedure: TRANSESOPHAGEAL ECHOCARDIOGRAM (TEE);  Surgeon: Brandon Bollman, MD;  Location: [facility] OR;  Service: Open Heart Surgery;  Laterality: N/A;   TONSILLECTOMY     TOTAL HIP ARTHROPLASTY Left 12/04/2020   Procedure: TOTAL HIP ARTHROPLASTY ANTERIOR APPROACH;  Surgeon: Samson Frederic, MD;  Location: [facility];  Service: Orthopedics;  Laterality: Left;   TOTAL HIP ARTHROPLASTY Left 11/2020   TRACHEOSTOMY  12/02/2017   Dr. Hezzie [facility]ump- [facility]   ULTRASOUND GUIDANCE FOR VASCULAR ACCESS Right 06/07/2020   Procedure: ULTRASOUND GUIDANCE FOR VASCULAR ACCESS;  Surgeon: Brandon Bollman, MD;  Location: [facility] OR;  Service: Open Heart Surgery;  Laterality: Right;   ULTRASOUND GUIDANCE FOR VASCULAR ACCESS Bilateral 03/03/2021   Procedure: ULTRASOUND GUIDANCE FOR VASCULAR ACCESS, BILATERAL FEMORAL ARTERIES;  Surgeon: Brandon Harman, MD;  Location: [facility] OR;  Service: Vascular;  Laterality: Bilateral;   VASCULAR SURGERY     Past Medical History:  Diagnosis Date   Abdominal aneurysm (HCC)    Arthritis    "all over" (07/19/2016)   BPH (benign prostatic hypertrophy)    CAD (coronary artery disease)    Chicken pox    Chronic  lower back pain    s/p surgical fusion   Depression    Diverticulosis    Esophageal stricture    GERD (gastroesophageal reflux disease)    Hepatitis B 1984   Hiatal hernia    History of radiation therapy 01/16/18- 03/05/18   Left Tonsil, 66 Gy in 33 fractions to high risk nodal echelons.    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 Excell Seltzer and Dr Laneta Simmers    Severe aortic stenosis    TIA (transient ischemic attack) 1990s   hx   tonsillar ca dx'd 11/2017   Tubular adenoma of colon 2009   BP (!) 140/70   Pulse 63   Ht 5\' 8"  (1.727 m)   Wt 146 lb (66.2 kg)   SpO2 96%   BMI 22.20 kg/m   Opioid Risk Score:   Fall Risk Score:  `1  Depression screen [facility] 2/9     07/16/2022   11:23 AM 06/26/2022    8:06 AM 06/15/2022    9:49 AM 06/11/2022    2:09 PM 05/16/2022   10:56 AM 03/09/2022   10:15 AM 02/12/2022    1:16 PM  Depression screen PHQ 2/9  Decreased Interest 2 1 0 0 1 0 1  Down, Depressed, Hopeless 1 1 0 0  0 1  PHQ - 2 Score 3 2 0 0 1 0 2  Altered sleeping  3       Tired, decreased energy  3       Change in appetite  3       Feeling bad or failure about yourself   2       Trouble concentrating  0  Moving slowly or fidgety/restless  1       Suicidal thoughts  0       PHQ-9 Score  14       Difficult doing work/chores  Somewhat difficult          Review of Systems  Musculoskeletal:  Positive for back pain.       B/L arm hand elbow pain RT leg pain LT shoulder pain  All other systems reviewed and are negative.      Objective:   Physical Exam        Assessment & Plan:

## 2022-09-27 ENCOUNTER — Other Ambulatory Visit (HOSPITAL_COMMUNITY): Payer: Self-pay | Admitting: Cardiology

## 2022-10-06 ENCOUNTER — Other Ambulatory Visit: Payer: Self-pay | Admitting: Rheumatology

## 2022-10-06 MED ORDER — PREDNISONE 1 MG PO TABS: 3.0000 mg | ORAL_TABLET | Freq: Every day | ORAL | 0 refills | Status: DC

## 2022-10-06 MED ORDER — PREDNISONE 5 MG PO TABS: ORAL_TABLET | ORAL | 0 refills | Status: DC

## 2022-10-09 NOTE — Progress Notes (Unsigned)
Office Visit Note  Patient: Brandon [name]             Date of Birth: Dec 29, 1940           MRN: [id number]             PCP: Loyola Mast, MD Referring: Loyola Mast, MD Visit Date: 10/23/2022 Occupation: @GUAROCC @  Subjective:  Medication monitoring   History of Present Illness: Brandon [name] is a 82 y.o. male with history of polymyalgia rheumatica, osteoporosis, and DDD.  Patient denies any symptoms of a polymyalgia rheumatica flare.  He states that he has noticed less joint pain and inflammation recently and has been able to reduce prednisone to 6 mg daily.  He reduced the dose of prednisone about 2 weeks ago and has not noticed any new or worsening symptoms.  Last couple of days he has had some increased redness and stiffness which he attributes to the tropical depression currently passing through but denies any joint swelling.  He states he has been taking Tylenol 2 tablets in the morning and 2 tablets at bedtime which he has found to be helpful at alleviating his joint pain.   Patient received his first IV Reclast infusion on 09/11/2022.  He tolerated Reclast without any side effects.  He has been taking a calcium and vitamin D supplement daily.   Activities of Daily Living:  Patient reports morning stiffness for 3 hours.   Patient Denies nocturnal pain.  Difficulty dressing/grooming: Denies Difficulty climbing stairs: Reports Difficulty getting out of chair: Denies Difficulty using hands for taps, buttons, cutlery, and/or writing: Denies  Review of Systems  Constitutional:  Negative for fatigue.  HENT:  Positive for mouth dryness. Negative for mouth sores.   Eyes:  Negative for dryness.  Respiratory:  Negative for shortness of breath.   Cardiovascular:  Negative for chest pain and palpitations.  Gastrointestinal:  Negative for blood in stool, constipation and diarrhea.  Endocrine: Negative for increased urination.  Genitourinary:  Negative for involuntary urination.   Musculoskeletal:  Positive for joint pain, gait problem, joint pain, muscle weakness, morning stiffness and muscle tenderness. Negative for joint swelling, myalgias and myalgias.  Skin:  Negative for color change, rash, hair loss and sensitivity to sunlight.  Allergic/Immunologic: Negative for susceptible to infections.  Neurological:  Positive for dizziness and headaches.  Hematological:  Positive for bruising/bleeding tendency. Negative for swollen glands.  Psychiatric/Behavioral:  Positive for sleep disturbance. Negative for depressed mood. The patient is not nervous/anxious.     PMFS History:  Patient Active Problem List   Diagnosis Date Noted   Age related osteoporosis 08/31/2022   Malnutrition of mild degree (HCC) 08/01/2022   Insomnia 06/26/2022   Arthritis of left hand 06/26/2022   Weight loss 06/26/2022   Chronic gout of multiple sites 04/30/2022   Acute exacerbation of chronic bronchitis (HCC) 04/03/2022   Chronically on opiate therapy 02/12/2022   Sacroiliac joint pain 02/12/2022   At risk for polypharmacy 01/19/2022   Aspiration pneumonia (HCC) 01/18/2022   Trochanteric bursitis of left hip 01/02/2022   Dysfunction of left eustachian tube 12/05/2021   Hypotension 08/18/2021   AKI (acute kidney injury) (HCC) 08/17/2021   Hyponatremia 06/26/2021   Hyperkalemia 06/26/2021   Acute metabolic encephalopathy 06/26/2021   Myoclonic jerking 06/26/2021   Lumbar radiculopathy 06/08/2021   Lumbar postlaminectomy syndrome 05/03/2021   Degeneration of lumbar intervertebral disc 05/03/2021   Lumbar spondylosis 05/03/2021   History of hip fracture 04/24/2021   S/P  abdominal aortic aneurysm repair 04/24/2021   Chronic allergic rhinitis 01/24/2021   Demand ischemia    Acute respiratory failure with hypoxia (HCC)    Elevated troponin    Chronic diastolic heart failure (HCC) 12/03/2020   Steatosis of liver 11/01/2020   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   Hepatitis B core antibody  positive 11/01/2020   Benign prostatic hyperplasia 11/01/2020   Chronic obstructive pulmonary disease (HCC) 07/20/2020   History of placement of stent in LAD coronary artery 07/20/2020   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   TIA (transient ischemic attack)    Esophageal stricture    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   Primary osteoarthritis of both hands 01/11/2020   Piriformis syndrome of both sides 09/23/2019   Pain of both shoulder joints 07/29/2019   Sciatica associated with disorder of lumbar spine 04/24/2019   Primary osteoarthritis involving multiple joints 04/24/2019   Chronic pain syndrome 04/24/2019   Mixed anxiety and depressive disorder 04/15/2018   Oropharyngeal dysphagia 03/11/2018   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   Liver abscess 07/10/2016   Iron deficiency anemia 07/10/2016   Essential hypertension 05/19/2013   Psoriasis 01/13/2009   Coronary artery disease 09/15/2008   Diverticulosis of colon 08/27/2007   History of benign prostatic hyperplasia 08/27/2007   Hyperlipidemia 03/19/2007   Gastroesophageal reflux disease 03/19/2007    Past Medical History:  Diagnosis Date   Abdominal aneurysm (HCC)    Arthritis    "all over" (07/19/2016)   BPH (benign prostatic hypertrophy)    CAD (coronary artery disease)    Chicken pox    Chronic lower back pain    s/p surgical fusion   Depression    Diverticulosis    Esophageal stricture    GERD (gastroesophageal reflux disease)    Hepatitis B 1984   Hiatal hernia    History of radiation therapy 01/16/18- 03/05/18   Left Tonsil, 66 Gy in 33 fractions to high risk nodal echelons.    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 Excell Seltzer and Dr Laneta Simmers    Severe aortic stenosis    TIA  (transient ischemic attack) 1990s   hx   tonsillar ca dx'd 11/2017   Tubular adenoma of colon 2009    Family History  Problem Relation Age of Onset   Heart disease Father 64       Living   Coronary artery disease Father        CABG   Alzheimer's disease Mother 52       Deceased   Arthritis Mother    Aneurysm Brother    Stomach cancer Maternal Uncle    Brain cancer Maternal Aunt        x2   Obesity Daughter        Had Bypass Sx   Past Surgical History:  Procedure Laterality Date   ABDOMINAL AORTIC ENDOVASCULAR STENT GRAFT N/A 03/03/2021   Procedure: ABDOMINAL AORTIC ENDOVASCULAR STENT GRAFT;  Surgeon: Maeola Harman, MD;  Location: [facility] OR;  Service: Vascular;  Laterality: N/A;   ABDOMINAL AORTOGRAM W/LOWER EXTREMITY N/A 04/10/2021   Procedure: ABDOMINAL AORTOGRAM W/LOWER EXTREMITY;  Surgeon: Maeola Harman, MD;  Location: [facility] INVASIVE CV LAB;  Service: Cardiovascular;  Laterality: N/A;   BACK SURGERY  CARDIAC CATHETERIZATION     CATARACT EXTRACTION W/ INTRAOCULAR LENS  IMPLANT, BILATERAL Bilateral 01/2012 - 02/2012   CORONARY ATHERECTOMY N/A 04/27/2020   Procedure: CORONARY ATHERECTOMY;  Surgeon: Tonny Bollman, MD;  Location: [facility] INVASIVE CV LAB;  Service: Cardiovascular;  Laterality: N/A;   CORONARY IMAGING/OCT N/A 04/27/2020   Procedure: INTRAVASCULAR IMAGING/OCT;  Surgeon: Tonny Bollman, MD;  Location: [name] INVASIVE CV LAB;  Service: Cardiovascular;  Laterality: N/A;   CORONARY PRESSURE/FFR STUDY N/A 04/27/2020   Procedure: INTRAVASCULAR PRESSURE WIRE/FFR STUDY;  Surgeon: Tonny Bollman, MD;  Location: [facility] INVASIVE CV LAB;  Service: Cardiovascular;  Laterality: N/A;   CORONARY STENT INTERVENTION N/A 04/27/2020   Procedure: CORONARY STENT INTERVENTION;  Surgeon: Tonny Bollman, MD;  Location: [facility] INVASIVE CV LAB;  Service: Cardiovascular;  Laterality: N/A;   DIRECT LARYNGOSCOPY Left 10/09/2017   Procedure: DIRECT LARYNGOSCOPY WITH BOPSY;  Surgeon: Flo Shanks, MD;  Location: [facility];  Service: ENT;  Laterality: Left;   ESOPHAGOGASTRODUODENOSCOPY (EGD) WITH ESOPHAGEAL DILATION     "couple times" (07/19/2016)   ESOPHAGOSCOPY Left 10/09/2017   Procedure: ESOPHAGOSCOPY;  Surgeon: Flo Shanks, MD;  Location: [facility];  Service: ENT;  Laterality: Left;   IR GASTROSTOMY TUBE MOD SED  01/08/2018   IR THORACENTESIS ASP PLEURAL SPACE W/IMG GUIDE  07/19/2016   LAPAROSCOPIC CHOLECYSTECTOMY  1984   LUMBAR DISC SURGERY  05/1996   L4-5; Dr. Rosanne Sack LAMINECTOMY/DECOMPRESSION MICRODISCECTOMY  10/2002   L3-4. Dr. Newell Coral   MULTIPLE TOOTH EXTRACTIONS  1980s   PARTIAL GLOSSECTOMY  12/02/2017   Dr. Hezzie Bump- [facility]   pharyngoplasty for closure of tingue base defect  12/02/2017   Dr. Hezzie Bump- [facility]   PICC LINE INSERTION  07/15/2016   POSTERIOR LUMBAR FUSION  09/1996   Ray cage, L4-5 Dr. Jule Ser   PROSTATE BIOPSY  ~ 2017   radical tonsillectomy Left 12/02/2017   Dr. Hezzie Bump at [facility]   RIGHT HEART CATH AND CORONARY ANGIOGRAPHY N/A 03/31/2020   Procedure: RIGHT HEART CATH AND CORONARY ANGIOGRAPHY;  Surgeon: Laurey Morale, MD;  Location: [facility] INVASIVE CV LAB;  Service: Cardiovascular;  Laterality: N/A;   RIGID BRONCHOSCOPY Left 10/09/2017   Procedure: RIGID BRONCHOSCOPY;  Surgeon: Flo Shanks, MD;  Location: [facility];  Service: ENT;  Laterality: Left;   TEE WITHOUT CARDIOVERSION N/A 06/07/2020   Procedure: TRANSESOPHAGEAL ECHOCARDIOGRAM (TEE);  Surgeon: Tonny Bollman, MD;  Location: [facility];  Service: Open Heart Surgery;  Laterality: N/A;   TONSILLECTOMY     TOTAL HIP ARTHROPLASTY Left 12/04/2020   Procedure: TOTAL HIP ARTHROPLASTY ANTERIOR APPROACH;  Surgeon: Samson Frederic, MD;  Location: [facility] ORS;  Service: Orthopedics;  Laterality: Left;   TOTAL HIP ARTHROPLASTY Left 11/2020   TRACHEOSTOMY  12/02/2017   Dr. Hezzie Bump- [facility]   ULTRASOUND GUIDANCE FOR VASCULAR ACCESS Right 06/07/2020    Procedure: ULTRASOUND GUIDANCE FOR VASCULAR ACCESS;  Surgeon: Tonny Bollman, MD;  Location: [facility] OR;  Service: Open Heart Surgery;  Laterality: Right;   ULTRASOUND GUIDANCE FOR VASCULAR ACCESS Bilateral 03/03/2021   Procedure: ULTRASOUND GUIDANCE FOR VASCULAR ACCESS, BILATERAL FEMORAL ARTERIES;  Surgeon: Maeola Harman, MD;  Location: [facility] OR;  Service: Vascular;  Laterality: Bilateral;   VASCULAR SURGERY     Social History   Social History Narrative   ** Merged History Encounter **       Married (3rd), [place]. 2 children from 1st marriage, 4 step children.    Retired on disability due to back  Former Runner, broadcasting/film/video.   restores antique furniture for a hobby.       Cell # [phone]   Immunization History  Administered Date(s) Administered   Fluad Quad(high Dose 65+) 10/15/2018, 11/06/2019, 10/20/2020   Influenza Split 11/26/2010, 10/11/2011, 11/03/2012   Influenza Whole 11/14/2006, 12/04/2007, 10/06/2008, 10/25/2009, 11/26/2021   Influenza, High Dose Seasonal PF 11/01/2016, 10/30/2017   Influenza, Seasonal, Injecte, Preservative Fre 12/07/2014   Influenza,inj,Quad PF,6+ Mos 11/04/2015   Influenza-Unspecified 11/19/2013   PFIZER(Purple Top)SARS-COV-2 Vaccination 02/25/2019, 03/18/2019, 11/16/2019   Pneumococcal Conjugate-13 12/07/2014   Pneumococcal Polysaccharide-23 05/30/2017   Tdap 01/28/2020     Objective: Vital Signs: BP (!) 112/56 (BP Location: Left Arm, Patient Position: Sitting, Cuff Size: Normal)   Pulse 67   Resp 17   Ht 5' 8.5" (1.74 m)   Wt 147 lb (66.7 kg)   BMI 22.03 kg/m    Physical Exam Vitals and nursing note reviewed.  Constitutional:      Appearance: He is well-developed.  HENT:     Head: Normocephalic and atraumatic.  Eyes:     Conjunctiva/sclera: Conjunctivae normal.     Pupils: Pupils are equal, round, and reactive to light.  Cardiovascular:     Rate and Rhythm: Normal rate and regular rhythm.     Heart sounds:  Normal heart sounds.  Pulmonary:     Effort: Pulmonary effort is normal.     Breath sounds: Normal breath sounds.  Abdominal:     General: Bowel sounds are normal.     Palpations: Abdomen is soft.  Musculoskeletal:     Cervical back: Normal range of motion and neck supple.  Skin:    General: Skin is warm and dry.     Capillary Refill: Capillary refill takes less than 2 seconds.  Neurological:     Mental Status: He is alert and oriented to person, place, and time.  Psychiatric:        Behavior: Behavior normal.      Musculoskeletal Exam: C-spine has limited ROM with lateral rotation.  Thoracic and lumbar spine good ROM.  Shoulder joints, elbow joints, wrist joints, MCPs, PIPs, and DIPs good ROM with no synovitis.  PIP and DIP thickening consistent with OA of both hands.  Left hip replacement and right hip joint have slightly limited ROM but no groin pain.  Knee joints have good ROM with no warmth or effusion.  Ankle joints have good ROM with no tenderness or joint swelling.    CDAI Exam: CDAI Score: -- Patient Global: --; Provider Global: -- Swollen: --; Tender: -- Joint Exam 10/23/2022   No joint exam has been documented for this visit   There is currently no information documented on the homunculus. Go to the Rheumatology activity and complete the homunculus joint exam.  Investigation: No additional findings.  Imaging: No results found.  Recent Labs: Lab Results  Component Value Date   WBC 8.9 08/22/2022   HGB 11.5 (L) 08/22/2022   PLT 142 08/22/2022   NA 140 08/22/2022   K 3.5 08/22/2022   CL 102 08/22/2022   CO2 31 08/22/2022   GLUCOSE 192 (H) 08/22/2022   BUN 19 08/22/2022   CREATININE 0.72 08/22/2022   BILITOT 0.4 08/22/2022   ALKPHOS 127 (H) 06/26/2022   AST 18 08/22/2022   ALT 19 08/22/2022   PROT 5.9 (L) 08/22/2022   PROT 6.0 (L) 08/22/2022   ALBUMIN 3.4 (L) 06/26/2022   CALCIUM 8.8 08/22/2022   GFRAA 107 08/11/2020  QFTBGOLDPLUS NEGATIVE 01/11/2020     Speciality Comments: Fosamax started on January 26, 2021.  If he has dysphagia with Fosamax or any GI intolerance we will have to switch him to Reclast.  Procedures:  No procedures performed Allergies: Tape and Celebrex [celecoxib]   Assessment / Plan:     Visit Diagnoses: Polymyalgia rheumatica (HCC) - History of muscle weakness and tenderness, elevated sed rate. Prednisone responsive: He is not exhibiting any signs or symptoms of a polymyalgia rheumatica flare.  He was able to raise his arms above his head without difficulty and rise from a seated position with minimal difficulty.  He has been able to reduce prednisone to 6 mg daily without any recurrence of symptoms.  He has no synovitis on examination today.  Patient plans on remaining on prednisone 6 mg daily for the next month and then will try reducing to 5 mg daily.  He will remain on 5 mg going forward.  He is aware of the risks of long-term prednisone use. The patient received his first IV Reclast infusion on 09/11/2022 which she tolerated without any side effects.  He is also been taking calcium and vitamin D supplement daily. Patient was advised to notify us if he develops signs or symptoms of a flare.  He will follow-up in the office in 3 months or sooner if needed.  Long term (current) use of systemic steroids - Prednisone 6 mg daily.  Plan to try reducing prednisone to 5 mg daily in 1 month.  He will remain on prednisone 5 mg long term.   Initiated IV reclast on 09/11/22. Aware of the risks of long term prednisone use.   Primary osteoarthritis of both hands: PIP and DIP thickening consistent with osteoarthritis of both hands.  No synovitis noted.  He has been experiencing less pain and stiffness in both hands since increasing tylenol intake to 2 tablets BID. He has been able to reduce prednisone to 6 mg daily.   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  Doing well.  Slightly limited range of motion but no discomfort at  this time.  DDD (degenerative disc disease), lumbar - Status post fusion by Dr. Newell Coral.  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  He has been taking allopurinol 200 mg daily.  No signs or symptoms of a gout flare.  Uric acid was 5.8 on 02/19/2022.  Age-related osteoporosis without current pathological fracture: DEXA updated on 07/27/2022: Right femoral neck BMD 0.558 with T-score -2.7.  -20% change in BMD for right total femur.  Reviewed DEXA results with the patient today in the office and all questions were addressed. Previous DEXA scan 07/21/20: showed T score -2.2 in the right femoral neck, BMD 0.638.  He was started on Fosamax 70 mg December 2022-has not been taking since the initial rx.  History of GERD.   Long term prednisonse use/history of PMR. Hx of hip fracture after a fall.  Plan to avoid anabolic agents given history of radiation therapy. Reviewed Prolia versus IV Reclast--patient requested to proceed with IV Reclast.  He had his first IV Reclast infusion on 09/11/2022.  He tolerated Reclast without any side effects.  He has been taking a calcium and vitamin D supplement daily.  Vitamin D deficiency: He is taking a vitamin D supplement daily.   Chronic pain syndrome: Under care of Dr. Berline Chough.  His pain level has been better  controlled since taking Tylenol 2 tablets in the morning and 2 tablets at bedtime for pain relief.   Psoriasis: No active psoriasis at this time.   Other medical conditions are listed as follows:   Oropharyngeal dysphagia  Liver abscess  Aneurysm of infrarenal abdominal aorta, unspecified whether ruptured (HCC)  History of diverticulitis  History of gastroesophageal reflux (GERD)  History of aortic valve replacement  History of hyperlipidemia  Essential hypertension: BP was 112/56 today in the office.    Cancer of tonsillar fossa (HCC)  Anxiety and depression  History of atherectomy  Former smoker  History of iron deficiency anemia  Orders: No orders of the defined types were placed in this encounter.  No orders of the defined types were placed in this encounter.    Follow-Up Instructions: Return in about 3 months (around 01/22/2023) for Polymyalgia Rheumatica.   Gearldine Bienenstock, PA-C  Note - This record has been created using Dragon software.  Chart creation errors have been sought, but may not always  have been located. Such creation errors do not reflect on  the standard of medical care.

## 2022-10-23 ENCOUNTER — Other Ambulatory Visit: Payer: Self-pay | Admitting: Physician Assistant

## 2022-10-23 ENCOUNTER — Encounter: Payer: Self-pay | Admitting: Physician Assistant

## 2022-10-23 ENCOUNTER — Ambulatory Visit: Payer: Medicare Other | Attending: Physician Assistant | Admitting: Physician Assistant

## 2022-10-23 VITALS — BP 112/56 | HR 67 | Resp 17 | Ht 68.5 in | Wt 147.0 lb

## 2022-10-23 DIAGNOSIS — Z7952 Long term (current) use of systemic steroids: Secondary | ICD-10-CM

## 2022-10-23 DIAGNOSIS — Z862 Personal history of diseases of the blood and blood-forming organs and certain disorders involving the immune mechanism: Secondary | ICD-10-CM

## 2022-10-23 DIAGNOSIS — M19041 Primary osteoarthritis, right hand: Secondary | ICD-10-CM | POA: Diagnosis not present

## 2022-10-23 DIAGNOSIS — I7143 Infrarenal abdominal aortic aneurysm, without rupture: Secondary | ICD-10-CM

## 2022-10-23 DIAGNOSIS — M810 Age-related osteoporosis without current pathological fracture: Secondary | ICD-10-CM

## 2022-10-23 DIAGNOSIS — C090 Malignant neoplasm of tonsillar fossa: Secondary | ICD-10-CM

## 2022-10-23 DIAGNOSIS — M51369 Other intervertebral disc degeneration, lumbar region without mention of lumbar back pain or lower extremity pain: Secondary | ICD-10-CM

## 2022-10-23 DIAGNOSIS — Z9889 Other specified postprocedural states: Secondary | ICD-10-CM

## 2022-10-23 DIAGNOSIS — E559 Vitamin D deficiency, unspecified: Secondary | ICD-10-CM

## 2022-10-23 DIAGNOSIS — M353 Polymyalgia rheumatica: Secondary | ICD-10-CM

## 2022-10-23 DIAGNOSIS — Z96642 Presence of left artificial hip joint: Secondary | ICD-10-CM

## 2022-10-23 DIAGNOSIS — Z87891 Personal history of nicotine dependence: Secondary | ICD-10-CM

## 2022-10-23 DIAGNOSIS — F419 Anxiety disorder, unspecified: Secondary | ICD-10-CM

## 2022-10-23 DIAGNOSIS — Z8719 Personal history of other diseases of the digestive system: Secondary | ICD-10-CM

## 2022-10-23 DIAGNOSIS — M1A0210 Idiopathic chronic gout, right elbow, without tophus (tophi): Secondary | ICD-10-CM

## 2022-10-23 DIAGNOSIS — G894 Chronic pain syndrome: Secondary | ICD-10-CM

## 2022-10-23 DIAGNOSIS — L409 Psoriasis, unspecified: Secondary | ICD-10-CM

## 2022-10-23 DIAGNOSIS — F32A Depression, unspecified: Secondary | ICD-10-CM

## 2022-10-23 DIAGNOSIS — Z952 Presence of prosthetic heart valve: Secondary | ICD-10-CM

## 2022-10-23 DIAGNOSIS — I10 Essential (primary) hypertension: Secondary | ICD-10-CM

## 2022-10-23 DIAGNOSIS — Z8639 Personal history of other endocrine, nutritional and metabolic disease: Secondary | ICD-10-CM

## 2022-10-23 DIAGNOSIS — M5136 Other intervertebral disc degeneration, lumbar region: Secondary | ICD-10-CM

## 2022-10-23 DIAGNOSIS — K750 Abscess of liver: Secondary | ICD-10-CM

## 2022-10-23 DIAGNOSIS — R1312 Dysphagia, oropharyngeal phase: Secondary | ICD-10-CM

## 2022-10-23 DIAGNOSIS — M19042 Primary osteoarthritis, left hand: Secondary | ICD-10-CM

## 2022-10-23 NOTE — Telephone Encounter (Signed)
Last Fill: 07/20/2022  Labs: 08/22/2022 Glucose is 192. Total protein is borderline low-6.0 but has improved. Rest of CMP WNL Anemia is improving. 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

## 2022-11-14 ENCOUNTER — Encounter: Payer: Self-pay | Admitting: Physical Medicine and Rehabilitation

## 2022-11-14 ENCOUNTER — Encounter
Payer: Medicare Other | Attending: Physical Medicine and Rehabilitation | Admitting: Physical Medicine and Rehabilitation

## 2022-11-14 VITALS — BP 129/63 | HR 66 | Ht 68.5 in | Wt 150.0 lb

## 2022-11-14 DIAGNOSIS — M25511 Pain in right shoulder: Secondary | ICD-10-CM | POA: Insufficient documentation

## 2022-11-14 DIAGNOSIS — M25512 Pain in left shoulder: Secondary | ICD-10-CM | POA: Diagnosis not present

## 2022-11-14 DIAGNOSIS — G894 Chronic pain syndrome: Secondary | ICD-10-CM | POA: Insufficient documentation

## 2022-11-14 DIAGNOSIS — Z5181 Encounter for therapeutic drug level monitoring: Secondary | ICD-10-CM | POA: Diagnosis not present

## 2022-11-14 DIAGNOSIS — M353 Polymyalgia rheumatica: Secondary | ICD-10-CM | POA: Insufficient documentation

## 2022-11-14 DIAGNOSIS — M5416 Radiculopathy, lumbar region: Secondary | ICD-10-CM | POA: Insufficient documentation

## 2022-11-14 MED ORDER — MORPHINE SULFATE ER 15 MG PO TBCR: 15.0000 mg | EXTENDED_RELEASE_TABLET | Freq: Two times a day (BID) | ORAL | 0 refills | Status: DC

## 2022-11-14 MED ORDER — OXYCODONE HCL 10 MG PO TABS: 10.0000 mg | ORAL_TABLET | ORAL | 0 refills | Status: DC | PRN

## 2022-11-14 NOTE — Progress Notes (Signed)
Subjective:    Patient ID: Brandon [name], male    DOB: 1941-01-11, 82 y.o.   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 S/p L THR- 12/02/20  Getting AAA repair as of 1/23. Pt has had a recent  5th R finger fx- out of brace now.      Slept wrong on pillow= R neck and really aching this AM.   Had to be careful driving- when turned head,  Just/only  change in meds- down to 6 mg daily.   Sees Rheum again in a few weeks.   Taking Oxycodone and MS Contin  Still hurting, but able to do yard work and house work- and going better overall.   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    Gout doing much better Sometimes once in awhile, R elbow will flare up. But overall under control.   Better off than was. Hopes it stays that way.  Actually a little more active than was.  Doing some minor repair work for customers.   Still has R buttock pain- is an ache- sometimes doesn't bother him, and then if gets too bad, will sit down. And give it a break.  Trying to not overdo it.    Mood is doing OK- not taking Celexa at this time Doesn't feel like made a difference-     Pain Inventory Average Pain 6 Pain Right Now 6 My pain is tingling and aching  In the last 24 hours, has pain interfered with the following? General activity 6 Relation with others 6 Enjoyment of life 7 What TIME of day is your pain at its worst? morning  and evening Sleep (in general) Fair  Pain is worse with: walking, bending, inactivity, and standing Pain improves with:  rest, medication, and injections Relief from Meds: 7  Family History  Problem Relation Age of Onset   Heart disease Father 6       Living   Coronary artery disease Father        CABG   Alzheimer's disease Mother 64       Deceased   Arthritis Mother    Aneurysm Brother    Stomach cancer Maternal Uncle    Brain cancer Maternal Aunt        x2   Obesity Daughter        Had Bypass Sx   Social History   Socioeconomic History   Marital status: Married    Spouse name: etta   Number of children: 2   Years of education: Not on file   Highest education level: Not on file  Occupational History   Occupation: retired    Associate Professor: RETIRED    Comment: disabled due to back problems  Tobacco Use   Smoking status: Former    Current packs/day: 0.00    Average packs/day: 1.5 packs/day for 50.0 years (75.0 ttl pk-yrs)    Types: Cigarettes    Start date: 09/06/1967  Quit date: 09/05/2017    Years since quitting: 5.1    Passive exposure: Never   Smokeless tobacco: Never   Tobacco comments:    smoked less than 1 ppd for 40+ years;   Vaping Use   Vaping status: Never Used  Substance and Sexual Activity   Alcohol use: Yes    Comment: rarely   Drug use: Yes    Types: Oxycodone, Morphine    Comment: has prescription   Sexual activity: Not Currently  Other Topics Concern   Not on file  Social History Narrative   ** Merged History Encounter **       Married (3rd), [place]. 2 children from 1st marriage, 4 step children.    Retired on disability due to back    Former Runner, broadcasting/film/video.   restores antique furniture for a hobby.       Cell # [phone]   Social Determinants of Health   Financial Resource Strain: Low Risk  (06/15/2022)   Overall Financial Resource Strain (CARDIA)    Difficulty of Paying Living Expenses: Not hard at all  Food Insecurity: No Food Insecurity (06/15/2022)   Hunger Vital Sign    Worried About Running Out of Food in the Last Year: Never true     Ran Out of Food in the Last Year: Never true  Transportation Needs: No Transportation Needs (06/15/2022)   PRAPARE - Administrator, Civil Service (Medical): No    Lack of Transportation (Non-Medical): No  Physical Activity: Inactive (06/15/2022)   Exercise Vital Sign    Days of Exercise per Week: 0 days    Minutes of Exercise per Session: 0 min  Stress: No Stress Concern Present (06/15/2022)   [employer] of Occupational Health - Occupational Stress Questionnaire    Feeling of Stress : Not at all  Social Connections: Moderately Integrated (06/07/2021)   Social Connection and Isolation Panel [NHANES]    Frequency of Communication with Friends and Family: Twice a week    Frequency of Social Gatherings with Friends and Family: Twice a week    Attends Religious Services: More than 4 times per year    Active Member of [employer] or Organizations: No    Attends Banker Meetings: Never    Marital Status: Married   Past Surgical History:  Procedure Laterality Date   ABDOMINAL AORTIC ENDOVASCULAR STENT GRAFT N/A 03/03/2021   Procedure: ABDOMINAL AORTIC ENDOVASCULAR STENT GRAFT;  Surgeon: Maeola Harman, MD;  Location: [facility] OR;  Service: Vascular;  Laterality: N/A;   ABDOMINAL AORTOGRAM W/LOWER EXTREMITY N/A 04/10/2021   Procedure: ABDOMINAL AORTOGRAM W/LOWER EXTREMITY;  Surgeon: Maeola Harman, MD;  Location: [facility] INVASIVE CV LAB;  Service: Cardiovascular;  Laterality: N/A;   BACK SURGERY     CARDIAC CATHETERIZATION     CATARACT EXTRACTION W/ INTRAOCULAR LENS  IMPLANT, BILATERAL Bilateral 01/2012 - 02/2012   CORONARY ATHERECTOMY N/A 04/27/2020   Procedure: CORONARY ATHERECTOMY;  Surgeon: Tonny Bollman, MD;  Location: [facility] INVASIVE CV LAB;  Service: Cardiovascular;  Laterality: N/A;   CORONARY IMAGING/OCT N/A 04/27/2020   Procedure: INTRAVASCULAR IMAGING/OCT;  Surgeon: Tonny Bollman, MD;  Location: [facility] INVASIVE CV LAB;  Service: Cardiovascular;   Laterality: N/A;   CORONARY PRESSURE/FFR STUDY N/A 04/27/2020   Procedure: INTRAVASCULAR PRESSURE WIRE/FFR STUDY;  Surgeon: Tonny Bollman, MD;  Location: [facility] INVASIVE CV LAB;  Service: Cardiovascular;  Laterality: N/A;   CORONARY STENT INTERVENTION N/A 04/27/2020   Procedure: CORONARY STENT INTERVENTION;  Surgeon: Excell Seltzer,  Casimiro Needle, MD;  Location: [facility] INVASIVE CV LAB;  Service: Cardiovascular;  Laterality: N/A;   DIRECT LARYNGOSCOPY Left 10/09/2017   Procedure: DIRECT LARYNGOSCOPY WITH BOPSY;  Surgeon: Flo Shanks, MD;  Location: [facility];  Service: ENT;  Laterality: Left;   ESOPHAGOGASTRODUODENOSCOPY (EGD) WITH ESOPHAGEAL DILATION     "couple times" (07/19/2016)   ESOPHAGOSCOPY Left 10/09/2017   Procedure: ESOPHAGOSCOPY;  Surgeon: Flo Shanks, MD;  Location: [facility];  Service: ENT;  Laterality: Left;   IR GASTROSTOMY TUBE MOD SED  01/08/2018   IR THORACENTESIS ASP PLEURAL SPACE W/IMG GUIDE  07/19/2016   LAPAROSCOPIC CHOLECYSTECTOMY  1984   LUMBAR DISC SURGERY  05/1996   L4-5; Dr. Rosanne Sack LAMINECTOMY/DECOMPRESSION MICRODISCECTOMY  10/2002   L3-4. Dr. Newell Coral   MULTIPLE TOOTH EXTRACTIONS  1980s   PARTIAL GLOSSECTOMY  12/02/2017   Dr. [facility]zzie [facility]ump- [facility]   pharyngoplasty for closure of tingue base defect  12/02/2017   Dr. [facility]zzie [facility]ump- [facility]   PICC LINE INSERTION  07/15/2016   POSTERIOR LUMBAR FUSION  09/1996   Ray cage, L4-5 Dr. Jule Ser   PROSTATE BIOPSY  ~ 2017   radical tonsillectomy Left 12/02/2017   [facility]   RIGHT HEART CATH AND CORONARY ANGIOGRAPHY N/A 03/31/2020   Procedure: RIGHT HEART CATH AND CORONARY ANGIOGRAPHY;  Surgeon: Laurey Morale, MD;  Location: [facility] INVASIVE CV LAB;  Service: Cardiovascular;  Laterality: N/A;   RIGID BRONCHOSCOPY Left 10/09/2017   Procedure: RIGID BRONCHOSCOPY;  Surgeon: Flo Shanks, MD;  Location: [facility];  Service: ENT;  Laterality: Left;   TEE WITHOUT CARDIOVERSION N/A  06/07/2020   Procedure: TRANSESOPHAGEAL ECHOCARDIOGRAM (TEE);  Surgeon: Tonny Bollman, MD;  Location: [facility] OR;  Service: Open Heart Surgery;  Laterality: N/A;   TONSILLECTOMY     TOTAL HIP ARTHROPLASTY Left 12/04/2020   Procedure: TOTAL HIP ARTHROPLASTY ANTERIOR APPROACH;  Surgeon: Samson Frederic, MD;  Location: [facility] ORS;  Service: Orthopedics;  Laterality: Left;   TOTAL HIP ARTHROPLASTY Left 11/2020   TRACHEOSTOMY  12/02/2017   Dr. Hezzie Bump- [facility]   ULTRASOUND GUIDANCE FOR VASCULAR ACCESS Right 06/07/2020   Procedure: ULTRASOUND GUIDANCE FOR VASCULAR ACCESS;  Surgeon: Tonny Bollman, MD;  Location: [facility] OR;  Service: Open Heart Surgery;  Laterality: Right;   ULTRASOUND GUIDANCE FOR VASCULAR ACCESS Bilateral 03/03/2021   Procedure: ULTRASOUND GUIDANCE FOR VASCULAR ACCESS, BILATERAL FEMORAL ARTERIES;  Surgeon: Maeola Harman, MD;  Location: [facility] OR;  Service: Vascular;  Laterality: Bilateral;   VASCULAR SURGERY     Past Surgical History:  Procedure Laterality Date   ABDOMINAL AORTIC ENDOVASCULAR STENT GRAFT N/A 03/03/2021   Procedure: ABDOMINAL AORTIC ENDOVASCULAR STENT GRAFT;  Surgeon: Maeola Harman, MD;  Location: [facility] OR;  Service: Vascular;  Laterality: N/A;   ABDOMINAL AORTOGRAM W/LOWER EXTREMITY N/A 04/10/2021   Procedure: ABDOMINAL AORTOGRAM W/LOWER EXTREMITY;  Surgeon: Maeola Harman, MD;  Location: [facility] INVASIVE CV LAB;  Service: Cardiovascular;  Laterality: N/A;   BACK SURGERY     CARDIAC CATHETERIZATION     CATARACT EXTRACTION W/ INTRAOCULAR LENS  IMPLANT, BILATERAL Bilateral 01/2012 - 02/2012   CORONARY ATHERECTOMY N/A 04/27/2020   Procedure: CORONARY ATHERECTOMY;  Surgeon: Tonny Bollman, MD;  Location: [facility] INVASIVE CV LAB;  Service: Cardiovascular;  Laterality: N/A;   CORONARY IMAGING/OCT N/A 04/27/2020   Procedure: INTRAVASCULAR IMAGING/OCT;  Surgeon: Tonny Bollman, MD;  Location: [facility] INVASIVE CV LAB;  Service: Cardiovascular;  Laterality: N/A;   CORONARY  PRESSURE/FFR STUDY N/A 04/27/2020  Procedure: INTRAVASCULAR PRESSURE WIRE/FFR STUDY;  Surgeon: Tonny Bollman, MD;  Location: [facility] INVASIVE CV LAB;  Service: Cardiovascular;  Laterality: N/A;   CORONARY STENT INTERVENTION N/A 04/27/2020   Procedure: CORONARY STENT INTERVENTION;  Surgeon: Tonny Bollman, MD;  Location: [facility] INVASIVE CV LAB;  Service: Cardiovascular;  Laterality: N/A;   DIRECT LARYNGOSCOPY Left 10/09/2017   Procedure: DIRECT LARYNGOSCOPY WITH BOPSY;  Surgeon: Flo Shanks, MD;  Location: [facility];  Service: ENT;  Laterality: Left;   ESOPHAGOGASTRODUODENOSCOPY (EGD) WITH ESOPHAGEAL DILATION     "couple times" (07/19/2016)   ESOPHAGOSCOPY Left 10/09/2017   Procedure: ESOPHAGOSCOPY;  Surgeon: Flo Shanks, MD;  Location: [facility];  Service: ENT;  Laterality: Left;   IR GASTROSTOMY TUBE MOD SED  01/08/2018   IR THORACENTESIS ASP PLEURAL SPACE W/IMG GUIDE  07/19/2016   LAPAROSCOPIC CHOLECYSTECTOMY  1984   LUMBAR DISC SURGERY  05/1996   L4-5; Dr. Rosanne Sack LAMINECTOMY/DECOMPRESSION MICRODISCECTOMY  10/2002   L3-4. Dr. Newell Coral   MULTIPLE TOOTH EXTRACTIONS  1980s   PARTIAL GLOSSECTOMY  12/02/2017   Dr. [facility]zzie [facility]ump- [facility]   pharyngoplasty for closure of tingue base defect  12/02/2017   Dr. Hezzie Bump- [facility]   PICC LINE INSERTION  07/15/2016   POSTERIOR LUMBAR FUSION  09/1996   Ray cage, L4-5 Dr. Jule Ser   PROSTATE BIOPSY  ~ 2017   radical tonsillectomy Left 12/02/2017   Dr. [facility]zzie [facility]ump at [facility]   RIGHT HEART CATH AND CORONARY ANGIOGRAPHY N/A 03/31/2020   Procedure: RIGHT HEART CATH AND CORONARY ANGIOGRAPHY;  Surgeon: Laurey Morale, MD;  Location: [facility] INVASIVE CV LAB;  Service: Cardiovascular;  Laterality: N/A;   RIGID BRONCHOSCOPY Left 10/09/2017   Procedure: RIGID BRONCHOSCOPY;  Surgeon: Flo Shanks, MD;  Location: [facility];  Service: ENT;  Laterality: Left;   TEE WITHOUT CARDIOVERSION N/A 06/07/2020   Procedure:  TRANSESOPHAGEAL ECHOCARDIOGRAM (TEE);  Surgeon: Tonny Bollman, MD;  Location: [facility] OR;  Service: Open Heart Surgery;  Laterality: N/A;   TONSILLECTOMY     TOTAL HIP ARTHROPLASTY Left 12/04/2020   Procedure: TOTAL HIP ARTHROPLASTY ANTERIOR APPROACH;  Surgeon: Samson Frederic, MD;  Location: [facility];  Service: Orthopedics;  Laterality: Left;   TOTAL HIP ARTHROPLASTY Left 11/2020   TRACHEOSTOMY  12/02/2017   Dr. [facility]zz[facility]ump- [facility]   ULTRASOUND GUIDANCE FOR VASCULAR ACCESS Right 06/07/2020   Procedure: ULTRASOUND GUIDANCE FOR VASCULAR ACCESS;  Surgeon: Tonny Bollman, MD;  Location: [facility] OR;  Service: Open Heart Surgery;  Laterality: Right;   ULTRASOUND GUIDANCE FOR VASCULAR ACCESS Bilateral 03/03/2021   Procedure: ULTRASOUND GUIDANCE FOR VASCULAR ACCESS, BILATERAL FEMORAL ARTERIES;  Surgeon: Maeola Harman, MD;  Location: [facility] OR;  Service: Vascular;  Laterality: Bilateral;   VASCULAR SURGERY     Past Medical History:  Diagnosis Date   Abdominal aneurysm (HCC)    Arthritis    "all over" (07/19/2016)   BPH (benign prostatic hypertrophy)    CAD (coronary artery disease)    Chicken pox    Chronic lower back pain    s/p surgical fusion   Depression    Diverticulosis    Esophageal stricture    GERD (gastroesophageal reflux disease)    Hepatitis B 1984   Hiatal hernia    History of radiation therapy 01/16/18- 03/05/18   Left Tonsil, 66 Gy in 33 fractions to high risk nodal echelons.    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 Excell Seltzer and Dr Laneta Simmers    Severe aortic stenosis    TIA (transient ischemic attack) 1990s   hx   tonsillar ca dx'd 11/2017   Tubular adenoma of colon 2009   BP 129/63   Pulse 66   Ht 5' 8.5" (1.74 m)   Wt 150 lb (68 kg)   SpO2 91%   BMI 22.48 kg/m   Opioid Risk Score:    Fall Risk Score:  `1  Depression screen [facility] 2/9     09/24/2022   11:03 AM 07/16/2022   11:23 AM 06/26/2022    8:06 AM 06/15/2022    9:49 AM 06/11/2022    2:09 PM 05/16/2022   10:56 AM 03/09/2022   10:15 AM  Depression screen PHQ 2/9  Decreased Interest 0 2 1 0 0 1 0  Down, Depressed, Hopeless 0 1 1 0 0  0  PHQ - 2 Score 0 3 2 0 0 1 0  Altered sleeping   3      Tired, decreased energy   3      Change in appetite   3      Feeling bad or failure about yourself    2      Trouble concentrating   0      Moving slowly or fidgety/restless   1      Suicidal thoughts   0      PHQ-9 Score   14      Difficult doing work/chores   Somewhat difficult         Review of Systems  Musculoskeletal:  Positive for back pain.       Right pelvic pain  All other systems reviewed and are negative.     Objective:   Physical Exam  Awake, alert, appropriate, weight appears stable/lower than used to be, NAD Moves very gingerly. 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 S/p L THR- 12/02/20  Getting AAA repair as of 1/23. Pt has had a recent  5th R finger fx- out of brace now.     Will refill Oxycodone 10 mg #150-  and MS Contin 15 mg 2x/day- # 60-  sent in 2 refills- so can get from [place] at next appointment   2. Urine drug screen today- is due-  Ordered  urine drug screen- and will monitor results.   3.  Stop Celexa and Prozac.  Since not taking.  Discussed mood in depth.   4. F/U in 2 months with Riley Lam and 6 months with me-     5. Discussed pain control,  gout, PMR and overall pain   I spent a total of 23   minutes on total care today- >50% coordination of care- due to   discussion about pain control; as well as function.

## 2022-11-14 NOTE — Patient Instructions (Signed)
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 S/p L THR- 12/02/20  Getting AAA repair as of 1/23. Pt has had a recent  5th R finger fx- out of brace now.     Will refill Oxycodone 10 mg #150-  and MS Contin 15 mg 2x/day- # 60-  sent in 2 refills- so can get from [place] at next appointment   2. Urine drug screen today- is due-  Ordered  urine drug screen- and will monitor results.   3.  Stop Celexa and Prozac.  Since not taking.    4. F/U in 2 months with Riley Lam and 6 months with me-

## 2022-11-19 LAB — TOXASSURE SELECT,+ANTIDEPR,UR

## 2022-11-22 ENCOUNTER — Other Ambulatory Visit (HOSPITAL_COMMUNITY): Payer: Self-pay | Admitting: Cardiology

## 2022-11-25 ENCOUNTER — Other Ambulatory Visit: Payer: Self-pay | Admitting: Physical Medicine and Rehabilitation

## 2022-11-27 ENCOUNTER — Telehealth: Payer: Self-pay

## 2022-11-27 NOTE — Telephone Encounter (Signed)
Brandon [name] did not get the Morphine on his last pick up. He wanted to know if you have stopped it?

## 2022-11-28 ENCOUNTER — Telehealth: Payer: Self-pay | Admitting: Registered Nurse

## 2022-11-28 MED ORDER — MORPHINE SULFATE ER 15 MG PO TBCR: 15.0000 mg | EXTENDED_RELEASE_TABLET | Freq: Two times a day (BID) | ORAL | 0 refills | Status: DC

## 2022-11-28 NOTE — Telephone Encounter (Signed)
The pharmacy has it and will fill it. Patient informed.

## 2022-11-28 NOTE — Telephone Encounter (Signed)
PMP was Reviewed.  UDS was Reviewed.  Dr Berline Chough note was reviewed.  MS Contin e-scribed to pharmacy

## 2022-12-12 ENCOUNTER — Other Ambulatory Visit: Payer: Self-pay | Admitting: *Deleted

## 2022-12-12 MED ORDER — PREDNISONE 5 MG PO TABS: ORAL_TABLET | ORAL | 0 refills | Status: DC

## 2022-12-12 NOTE — Telephone Encounter (Signed)
Last Fill: 10/06/2022  Next Visit: 01/24/2023  Last Visit: 10/23/2022  Dx:  Polymyalgia rheumatica   Current Dose per office note on 10/23/2022: Prednisone 6 mg daily.  Plan to try reducing prednisone to 5 mg daily in 1 month.  He will remain on prednisone 5 mg long term.    Okay to refill Prednisone?

## 2022-12-21 ENCOUNTER — Other Ambulatory Visit (HOSPITAL_COMMUNITY): Payer: Self-pay | Admitting: Cardiology

## 2022-12-25 ENCOUNTER — Telehealth: Payer: Self-pay

## 2022-12-25 MED ORDER — MORPHINE SULFATE ER 15 MG PO TBCR: 15.0000 mg | EXTENDED_RELEASE_TABLET | Freq: Two times a day (BID) | ORAL | 0 refills | Status: DC

## 2022-12-25 MED ORDER — OXYCODONE HCL 10 MG PO TABS: 10.0000 mg | ORAL_TABLET | ORAL | 0 refills | Status: DC | PRN

## 2022-12-25 NOTE — Telephone Encounter (Signed)
PMP Report:   Filled  Written  ID  Drug  QTY  Days  Prescriber  RX #  Dispenser  Refill  Daily Dose*  Pymt Type  PMP  11/28/2022 11/28/2022 1  Morphine Sulf Er 15 Mg Tablet 60.00 30 Eu Tho [id number] [facility] ([id number]) 0/0 30.00 MME Medicare [place] 11/24/2022 11/14/2022 1  Oxycodone Hcl (Ir) 10 Mg Tab 150.00 25 Me Lov [id number] [facility] ([id number]) 0/0 90.00 MME Medicare [place]

## 2022-12-26 NOTE — Telephone Encounter (Signed)
Patient informed. 

## 2023-01-02 ENCOUNTER — Other Ambulatory Visit (HOSPITAL_COMMUNITY): Payer: Self-pay

## 2023-01-02 DIAGNOSIS — I5032 Chronic diastolic (congestive) heart failure: Secondary | ICD-10-CM

## 2023-01-02 MED ORDER — EZETIMIBE 10 MG PO TABS: ORAL_TABLET | ORAL | 0 refills | Status: DC

## 2023-01-05 ENCOUNTER — Encounter: Payer: Self-pay | Admitting: Emergency Medicine

## 2023-01-05 ENCOUNTER — Ambulatory Visit
Admission: EM | Admit: 2023-01-05 | Discharge: 2023-01-05 | Disposition: A | Discharge status: Home / Self Care | Payer: Medicare Other | Attending: Physician Assistant | Admitting: Physician Assistant

## 2023-01-05 DIAGNOSIS — J441 Chronic obstructive pulmonary disease with (acute) exacerbation: Secondary | ICD-10-CM | POA: Insufficient documentation

## 2023-01-05 MED ORDER — AZITHROMYCIN 250 MG PO TABS: 250.0000 mg | ORAL_TABLET | Freq: Every day | ORAL | 0 refills | Status: DC

## 2023-01-05 MED ORDER — PREDNISONE 20 MG PO TABS: 40.0000 mg | ORAL_TABLET | Freq: Every day | ORAL | 0 refills | Status: AC

## 2023-01-05 NOTE — Discharge Instructions (Signed)
  Hold daily prednisone dosing while taking prednisone over the next five days.  Return to prednisone dosing once 5 day steroid burst is complete.  Follow up with any further concerns or persistent symptoms.

## 2023-01-05 NOTE — ED Provider Notes (Signed)
[facility]    CSN: [id number] Arrival date & time: 01/05/23  4098      History   Chief Complaint Chief Complaint  Patient presents with   Cough    HPI Brandon [name] is a 82 y.o. male.   P 5 atient here today for evaluation of cough, congestion, nasal drainage that started 3 days ago.  He reports he has had some mild worsening shortness of breath and has had more productive cough.  He does have known COPD.  He has tried taking Delsym and Mucinex with mild relief.  The history is provided by the patient.  Cough Associated symptoms: shortness of breath   Associated symptoms: no chills, no ear pain, no eye discharge, no fever and no sore throat     Past Medical History:  Diagnosis Date   Abdominal aneurysm (HCC)    Arthritis    "all over" (07/19/2016)   BPH (benign prostatic hypertrophy)    CAD (coronary artery disease)    Chicken pox    Chronic lower back pain    s/p surgical fusion   Depression    Diverticulosis    Esophageal stricture    GERD (gastroesophageal reflux disease)    Hepatitis B 1984   Hiatal hernia    History of radiation therapy 01/16/18- 03/05/18   Left Tonsil, 66 Gy in 33 fractions to high risk nodal echelons.    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 Excell Seltzer and Dr Laneta Simmers    Severe aortic stenosis    TIA (transient ischemic attack) 1990s   hx   tonsillar ca dx'd 11/2017   Tubular adenoma of colon 2009    Patient Active Problem List   Diagnosis Date Noted   Age related osteoporosis 08/31/2022   Malnutrition of mild degree (HCC) 08/01/2022   Insomnia 06/26/2022   Arthritis of left hand 06/26/2022   Weight loss 06/26/2022   Chronic gout of multiple sites 04/30/2022   Acute exacerbation of chronic bronchitis (HCC) 04/03/2022   Chronically on  opiate therapy 02/12/2022   Sacroiliac joint pain 02/12/2022   At risk for polypharmacy 01/19/2022   Aspiration pneumonia (HCC) 01/18/2022   Trochanteric bursitis of left hip 01/02/2022   Dysfunction of left eustachian tube 12/05/2021   Hypotension 08/18/2021   AKI (acute kidney injury) (HCC) 08/17/2021   Hyponatremia 06/26/2021   Hyperkalemia 06/26/2021   Acute metabolic encephalopathy 06/26/2021   Myoclonic jerking 06/26/2021   Lumbar radiculopathy 06/08/2021   Lumbar postlaminectomy syndrome 05/03/2021   Degeneration of lumbar intervertebral disc 05/03/2021   Lumbar spondylosis 05/03/2021   History of hip fracture 04/24/2021   S/P abdominal aortic aneurysm repair 04/24/2021   Chronic allergic rhinitis 01/24/2021   Demand ischemia (HCC)    Acute respiratory failure with hypoxia (HCC)    Elevated troponin    Chronic diastolic heart failure (HCC) 12/03/2020   Steatosis of liver 11/01/2020   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   Hepatitis B core antibody positive 11/01/2020   Benign prostatic hyperplasia 11/01/2020   Chronic obstructive pulmonary disease (HCC) 07/20/2020   History of placement of stent in LAD coronary artery 07/20/2020   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   TIA (transient ischemic attack)    Esophageal stricture    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   Primary osteoarthritis of both hands 01/11/2020   Piriformis syndrome of both sides 09/23/2019   Pain of both shoulder joints 07/29/2019   Sciatica associated with disorder of lumbar spine 04/24/2019   Primary osteoarthritis involving multiple joints 04/24/2019   Chronic pain syndrome 04/24/2019   Mixed anxiety and depressive disorder 04/15/2018   Oropharyngeal dysphagia 03/11/2018   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   Liver abscess 07/10/2016   Iron deficiency anemia 07/10/2016   Essential hypertension 05/19/2013   Psoriasis 01/13/2009   Coronary artery  disease 09/15/2008   Diverticulosis of colon 08/27/2007   History of benign prostatic hyperplasia 08/27/2007   Hyperlipidemia 03/19/2007   Gastroesophageal reflux disease 03/19/2007    Past Surgical History:  Procedure Laterality Date   ABDOMINAL AORTIC ENDOVASCULAR STENT GRAFT N/A 03/03/2021   Procedure: ABDOMINAL AORTIC ENDOVASCULAR STENT GRAFT;  Surgeon: Maeola Harman, MD;  Location: [facility] OR;  Service: Vascular;  Laterality: N/A;   ABDOMINAL AORTOGRAM W/LOWER EXTREMITY N/A 04/10/2021   Procedure: ABDOMINAL AORTOGRAM W/LOWER EXTREMITY;  Surgeon: Maeola Harman, MD;  Location: [facility] INVASIVE CV LAB;  Service: Cardiovascular;  Laterality: N/A;   BACK SURGERY     CARDIAC CATHETERIZATION     CATARACT EXTRACTION W/ INTRAOCULAR LENS  IMPLANT, BILATERAL Bilateral 01/2012 - 02/2012   CORONARY ATHERECTOMY N/A 04/27/2020   Procedure: CORONARY ATHERECTOMY;  Surgeon: Tonny Bollman, MD;  Location: [facility] INVASIVE CV LAB;  Service: Cardiovascular;  Laterality: N/A;   CORONARY IMAGING/OCT N/A 04/27/2020   Procedure: INTRAVASCULAR IMAGING/OCT;  Surgeon: Tonny Bollman, MD;  Location: [facility] INVASIVE CV LAB;  Service: Cardiovascular;  Laterality: N/A;   CORONARY PRESSURE/FFR STUDY N/A 04/27/2020   Procedure: INTRAVASCULAR PRESSURE WIRE/FFR STUDY;  Surgeon: Tonny Bollman, MD;  Location: [facility] INVASIVE CV LAB;  Service: Cardiovascular;  Laterality: N/A;   CORONARY STENT INTERVENTION N/A 04/27/2020   Procedure: CORONARY STENT INTERVENTION;  Surgeon: Tonny Bollman, MD;  Location: [facility] INVASIVE CV LAB;  Service: Cardiovascular;  Laterality: N/A;   DIRECT LARYNGOSCOPY Left 10/09/2017   Procedure: DIRECT LARYNGOSCOPY WITH BOPSY;  Surgeon: Flo Shanks, MD;  Location: [facility];  Service: ENT;  Laterality: Left;   ESOPHAGOGASTRODUODENOSCOPY (EGD) WITH ESOPHAGEAL DILATION     "couple times" (07/19/2016)   ESOPHAGOSCOPY Left 10/09/2017   Procedure: ESOPHAGOSCOPY;  Surgeon: Flo Shanks, MD;  Location: [facility];  Service: ENT;  Laterality: Left;   IR GASTROSTOMY TUBE MOD SED  01/08/2018   IR THORACENTESIS ASP PLEURAL SPACE W/IMG GUIDE  07/19/2016   LAPAROSCOPIC CHOLECYSTECTOMY  1984   LUMBAR DISC SURGERY  05/1996   L4-5; Dr. Rosanne Sack LAMINECTOMY/DECOMPRESSION MICRODISCECTOMY  10/2002   L3-4. Dr. Newell Coral   MULTIPLE TOOTH EXTRACTIONS  1980s   PARTIAL GLOSSECTOMY  12/02/2017   Dr. Hezzie Bump- [facility]   pharyngoplasty for closure of tingue base defect  12/02/2017   Dr. Hezzie Bump- [facility]   PICC LINE INSERTION  07/15/2016   POSTERIOR LUMBAR FUSION  09/1996   Ray cage, L4-5 Dr. Jule Ser   PROSTATE BIOPSY  ~ 2017   radical tonsillectomy Left 12/02/2017   Dr. [facility]zzie Bump at [facility]   RIGHT HEART CATH AND CORONARY ANGIOGRAPHY N/A 03/31/2020   Procedure: RIGHT HEART CATH AND CORONARY ANGIOGRAPHY;  Surgeon: Laurey Morale, MD;  Location: [facility] INVASIVE CV LAB;  Service: Cardiovascular;  Laterality: N/A;   RIGID BRONCHOSCOPY Left 10/09/2017   Procedure: RIGID BRONCHOSCOPY;  Surgeon: Flo Shanks, MD;  Location: [facility];  Service: ENT;  Laterality: Left;   TEE WITHOUT CARDIOVERSION N/A 06/07/2020   Procedure: TRANSESOPHAGEAL ECHOCARDIOGRAM (TEE);  Surgeon: Tonny Bollman, MD;  Location: [facility] OR;  Service: Open Heart Surgery;  Laterality: N/A;   TONSILLECTOMY     TOTAL HIP ARTHROPLASTY Left 12/04/2020   Procedure: TOTAL HIP ARTHROPLASTY ANTERIOR APPROACH;  Surgeon: Samson Frederic, MD;  Location: [facility] ORS;  Service: Orthopedics;  Laterality: Left;   TOTAL HIP ARTHROPLASTY Left 11/2020   TRACHEOSTOMY  12/02/2017   Dr. Hezzie Bump- [facility]   ULTRASOUND GUIDANCE FOR VASCULAR ACCESS Right 06/07/2020   Procedure: ULTRASOUND GUIDANCE FOR VASCULAR ACCESS;  Surgeon: Tonny Bollman, MD;  Location: [facility] OR;  Service: Open Heart Surgery;  Laterality: Right;   ULTRASOUND GUIDANCE FOR VASCULAR ACCESS Bilateral 03/03/2021   Procedure: ULTRASOUND GUIDANCE FOR  VASCULAR ACCESS, BILATERAL FEMORAL ARTERIES;  Surgeon: Maeola Harman, MD;  Location: [facility] OR;  Service: Vascular;  Laterality: Bilateral;   VASCULAR SURGERY         Home Medications    Prior to Admission medications   Medication Sig Start Date End Date Taking? Authorizing Provider  acetaminophen (TYLENOL) 500 MG tablet Take 1,000 mg by mouth every 8 (eight) hours as needed for mild pain or headache.   Yes [provider]  albuterol (VENTOLIN HFA) 108 (90 Base) MCG/ACT inhaler INHALE 2 PUFFS INTO THE LUNGS EVERY 6 HOURS AS NEEDED FOR WHEEZING OR SHORTNESS OF BREATH 09/18/22  Yes Loyola Mast, MD  allopurinol (ZYLOPRIM) 100 MG tablet TAKE 2 TABLETS(200 MG) BY MOUTH DAILY 10/23/22  Yes Gearldine Bienenstock, PA-C  amLODipine (NORVASC) 10 MG tablet TAKE 1 TABLET(10 MG) BY MOUTH DAILY 09/24/22  Yes Laurey Morale, MD  aspirin EC 81 MG tablet Take 81 mg by mouth at bedtime. Swallow whole.   Yes [provider]  atorvastatin (LIPITOR) 80 MG tablet TAKE 1 TABLET(80 MG) BY MOUTH DAILY 03/12/22  Yes Laurey Morale, MD  azithromycin (ZITHROMAX) 250 MG tablet Take 1 tablet (250 mg total) by mouth daily. Take first 2 tablets together, then 1 every day until finished. 01/05/23  Yes Tomi Bamberger, PA-C  carvedilol (COREG) 12.5 MG tablet Take 12.5 mg by mouth 2 (two) times daily. 09/24/22  Yes [provider]  ezetimibe (ZETIA) 10 MG tablet TAKE 1 TABLET(10 MG) BY MOUTH DAILY. NEED FOLLOW UP APPOINTMENT FOR MORE REFILLS 01/02/23  Yes Laurey Morale, MD  furosemide (LASIX) 20 MG tablet Take 1 tablet (20 mg total) by mouth daily. NEEDS FOLLOW UP APPOINTMENT FOR MORE REFILLS 12/21/22  Yes Laurey Morale, MD  guaiFENesin (MUCINEX) 600 MG 12 hr tablet Take 600 mg by mouth 2 (two) times daily as needed for cough or to loosen phlegm.   Yes [provider]  hydrOXYzine (ATARAX) 10 MG tablet Take 1-2 tablets (10-20 mg total) by mouth at bedtime as needed for itching (or  sleep). 04/03/22  Yes Loyola Mast, MD  ipratropium (ATROVENT) 0.02 % nebulizer solution Use 1 vial (0.5 mg total) by nebulization every 6 (six) hours as needed for wheezing or shortness of breath. 08/21/21  Yes Sheikh, Omair Latif, DO  ipratropium (ATROVENT) 0.03 % nasal spray Place 2 sprays into both nostrils every 12 (twelve) hours. 01/24/21  Yes Loyola Mast, MD  Iron, Ferrous Sulfate, 325 (65 Fe) MG TABS Take 325 mg by mouth daily. 06/27/22  Yes Loyola Mast, MD  levalbuterol Pauline Aus) 0.63 MG/3ML nebulizer solution Use 1 vial (0.63 mg total) by nebulization every 6 (six) hours as  needed for wheezing or shortness of breath. 08/21/21  Yes Sheikh, Omair Latif, DO  morphine (MS CONTIN) 15 MG 12 hr tablet Take 1 tablet (15 mg total) by mouth every 12 (twelve) hours. 11/28/22  Yes Jones Bales, NP  morphine (MS CONTIN) 15 MG 12 hr tablet Take 1 tablet (15 mg total) by mouth every 12 (twelve) hours. Can fill 12/24/22- for chronic pain 12/25/22  Yes Lovorn, Aundra Millet, MD  naloxone [facility]) nasal spray 4 mg/0.1 mL For possible accidentally overdose. 02/12/22  Yes Lovorn, Aundra Millet, MD  Oxycodone HCl 10 MG TABS Take 1 tablet (10 mg total) by mouth every 4 (four) hours as needed. 11/14/22  Yes Lovorn, Aundra Millet, MD  Oxycodone HCl 10 MG TABS Take 1 tablet (10 mg total) by mouth every 4 (four) hours as needed. Don't fill before 12/24/22 12/25/22  Yes Lovorn, Aundra Millet, MD  potassium chloride (KLOR-CON) 10 MEQ tablet Take 10 mEq by mouth 4 (four) times daily. 03/08/22  Yes [provider]  predniSONE (DELTASONE) 1 MG tablet Take 3 tablets (3 mg total) by mouth daily with breakfast. Take along with prednisone 5mg  tablet for a total of 8mg  daily. 10/06/22  Yes Deveshwar, Janalyn Rouse, MD  predniSONE (DELTASONE) 20 MG tablet Take 2 tablets (40 mg total) by mouth daily with breakfast for 5 days. 01/05/23 01/10/23 Yes Tomi Bamberger, PA-C  predniSONE (DELTASONE) 5 MG tablet TAKE 1 TABLET(5 MG) BY MOUTH DAILY WITH BREAKFAST  12/12/22  Yes Gearldine Bienenstock, PA-C  tamsulosin (FLOMAX) 0.4 MG CAPS capsule Take 0.4 mg by mouth See admin instructions. Take 0.4 mg by mouth one to two times a day 12/23/18  Yes [provider]  Tiotropium Bromide Monohydrate (SPIRIVA RESPIMAT) 2.5 MCG/ACT AERS Inhale 2 puffs into the lungs daily. 01/24/21  Yes Loyola Mast, MD  traZODone (DESYREL) 50 MG tablet Take 0.5-1 tablets (25-50 mg total) by mouth at bedtime as needed for sleep. 06/26/22  Yes Loyola Mast, MD  triamcinolone cream (KENALOG) 0.1 % Apply 1 Application topically 2 (two) times daily as needed (to itchy sites).   Yes [provider]  trolamine salicylate (ASPERCREME) 10 % cream Apply 1 Application topically 2 (two) times daily as needed (arthritis pain).   Yes [provider]  amLODipine (NORVASC) 5 MG tablet Take 1 tablet (5 mg total) by mouth daily. Patient not taking: Reported on 10/23/2022 03/09/22   Loyola Mast, MD  carvedilol (COREG) 6.25 MG tablet Take 1 tablet (6.25 mg total) by mouth 2 (two) times daily with a meal. Patient not taking: Reported on 10/23/2022 06/11/22   Mliss Sax, MD  ferrous sulfate 325 (65 FE) MG EC tablet Take 1 tablet (325 mg total) by mouth daily with breakfast. 01/20/22 07/10/22  Jerald Kief, MD  fluticasone (FLONASE) 50 MCG/ACT nasal spray SHAKE LIQUID AND USE 2 SPRAYS IN EACH NOSTRIL DAILY AS NEEDED FOR RHINITIS OR ALLERGIES Patient taking differently: Place 2 sprays into both nostrils daily. 11/14/20   Loyola Mast, MD  pantoprazole (PROTONIX) 40 MG tablet Take 1 tablet (40 mg total) by mouth daily. Patient taking differently: Take 40 mg by mouth daily as needed (for heartburn). 06/30/21 07/10/22  [place], Eric J, DO    Family History Family History  Problem Relation Age of Onset   Heart disease Father 70       Living   Coronary artery disease Father        CABG   Alzheimer's disease Mother 80  Deceased   Arthritis Mother    Aneurysm  Brother    Stomach cancer Maternal Uncle    Brain cancer Maternal Aunt        x2   Obesity Daughter        Had Bypass Sx    Social History Social History   Tobacco Use   Smoking status: Former    Current packs/day: 0.00    Average packs/day: 1.5 packs/day for 50.0 years (75.0 ttl pk-yrs)    Types: Cigarettes    Start date: 09/06/1967    Quit date: 09/05/2017    Years since quitting: 5.3    Passive exposure: Never   Smokeless tobacco: Never   Tobacco comments:    smoked less than 1 ppd for 40+ years;   Vaping Use   Vaping status: Never Used  Substance Use Topics   Alcohol use: Yes    Comment: rarely   Drug use: Yes    Types: Oxycodone, Morphine    Comment: has prescription     Allergies   Tape and Celebrex [celecoxib]   Review of Systems Review of Systems  Constitutional:  Negative for chills and fever.  HENT:  Positive for congestion. Negative for ear pain and sore throat.   Eyes:  Negative for discharge and redness.  Respiratory:  Positive for cough and shortness of breath.   Gastrointestinal:  Negative for abdominal pain, nausea and vomiting.     Physical Exam Triage Vital Signs ED Triage Vitals  Encounter Vitals Group     BP 01/05/23 0938 (!) 178/93     Systolic BP Percentile --      Diastolic BP Percentile --      Pulse Rate 01/05/23 0938 66     Resp 01/05/23 0938 18     Temp 01/05/23 0938 98.2 F (36.8 C)     Temp Source 01/05/23 0938 Oral     SpO2 01/05/23 0938 95 %     Weight 01/05/23 0939 149 lb 14.6 oz (68 kg)     Height 01/05/23 0939 5' 8.5" (1.74 m)     Head Circumference --      Peak Flow --      Pain Score 01/05/23 0939 0     Pain Loc --      Pain Education --      Exclude from Growth Chart --    No data found.  Updated Vital Signs BP (!) 178/93 (BP Location: Left Arm)   Pulse 66   Temp 98.2 F (36.8 C) (Oral)   Resp 18   Ht 5' 8.5" (1.74 m)   Wt 149 lb 14.6 oz (68 kg)   SpO2 95%   BMI 22.46 kg/m   Visual Acuity Right Eye  Distance:   Left Eye Distance:   Bilateral Distance:    Right Eye Near:   Left Eye Near:    Bilateral Near:     Physical Exam Vitals and nursing note reviewed.  Constitutional:      General: He is not in acute distress.    Appearance: He is well-developed. He is not ill-appearing.  HENT:     Head: Normocephalic and atraumatic.     Nose: Congestion present.     Mouth/Throat:     Mouth: Mucous membranes are moist.     Pharynx: No oropharyngeal exudate or posterior oropharyngeal erythema.     Tonsils: 0 on the right. 0 on the left.  Eyes:     Conjunctiva/sclera: Conjunctivae normal.  Cardiovascular:  Rate and Rhythm: Normal rate and regular rhythm.     Heart sounds: Normal heart sounds. No murmur heard. Pulmonary:     Effort: Pulmonary effort is normal. No respiratory distress.     Breath sounds: Rhonchi (mild scattered) present. No wheezing or rales.  Skin:    General: Skin is warm and dry.  Neurological:     Mental Status: He is alert.  Psychiatric:        Mood and Affect: Mood normal.        Behavior: Behavior normal.      UC Treatments / Results  Labs (all labs ordered are listed, but only abnormal results are displayed) Labs Reviewed  SARS CORONAVIRUS 2 (TAT 6-24 HRS)    EKG   Radiology No results found.  Procedures Procedures (including critical care time)  Medications Ordered in UC Medications - No data to display  Initial Impression / Assessment and Plan / UC Course  I have reviewed the triage vital signs and the nursing notes.  Pertinent labs & imaging results that were available during my care of the patient were reviewed by me and considered in my medical decision making (see chart for details).    Will treat to cover possible COPD exacerbation.  Steroid burst prescribed along with Z-Pak.  Recommended follow-up if no gradual improvement with any worsening symptoms.  Patient expressed understanding and does have follow-up with primary care  within the next few weeks.  Final Clinical Impressions(s) / UC Diagnoses   Final diagnoses:  COPD exacerbation (HCC)     Discharge Instructions       Hold daily prednisone dosing while taking prednisone over the next five days.  Return to prednisone dosing once 5 day steroid burst is complete.  Follow up with any further concerns or persistent symptoms.     ED Prescriptions     Medication Sig Dispense Auth. Provider   predniSONE (DELTASONE) 20 MG tablet Take 2 tablets (40 mg total) by mouth daily with breakfast for 5 days. 10 tablet Erma Pinto F, PA-C   azithromycin (ZITHROMAX) 250 MG tablet Take 1 tablet (250 mg total) by mouth daily. Take first 2 tablets together, then 1 every day until finished. 6 tablet Tomi Bamberger, PA-C      PDMP not reviewed this encounter.   Tomi Bamberger, PA-C 01/05/23 1006

## 2023-01-05 NOTE — ED Triage Notes (Signed)
Patient c/o cough, stuffiness, congestion, nasal drainage x 3 days.  Patient has taken OTC Delsym and Mucinex.

## 2023-01-06 LAB — SARS CORONAVIRUS 2 (TAT 6-24 HRS): SARS Coronavirus 2: NEGATIVE

## 2023-01-08 ENCOUNTER — Other Ambulatory Visit: Payer: Self-pay | Admitting: Family Medicine

## 2023-01-08 ENCOUNTER — Encounter: Payer: Medicare Other | Attending: Physical Medicine and Rehabilitation | Admitting: Registered Nurse

## 2023-01-08 VITALS — BP 134/68 | HR 75 | Ht 68.0 in | Wt 145.0 lb

## 2023-01-08 DIAGNOSIS — M5416 Radiculopathy, lumbar region: Secondary | ICD-10-CM | POA: Insufficient documentation

## 2023-01-08 DIAGNOSIS — G2581 Restless legs syndrome: Secondary | ICD-10-CM | POA: Diagnosis not present

## 2023-01-08 DIAGNOSIS — M47816 Spondylosis without myelopathy or radiculopathy, lumbar region: Secondary | ICD-10-CM | POA: Insufficient documentation

## 2023-01-08 DIAGNOSIS — G894 Chronic pain syndrome: Secondary | ICD-10-CM | POA: Insufficient documentation

## 2023-01-08 DIAGNOSIS — Z79891 Long term (current) use of opiate analgesic: Secondary | ICD-10-CM | POA: Insufficient documentation

## 2023-01-08 DIAGNOSIS — Z5181 Encounter for therapeutic drug level monitoring: Secondary | ICD-10-CM | POA: Diagnosis not present

## 2023-01-08 DIAGNOSIS — G4700 Insomnia, unspecified: Secondary | ICD-10-CM

## 2023-01-08 MED ORDER — TRAZODONE HCL 50 MG PO TABS: 25.0000 mg | ORAL_TABLET | Freq: Every evening | ORAL | 3 refills | Status: DC | PRN

## 2023-01-08 MED ORDER — OXYCODONE HCL 10 MG PO TABS: 10.0000 mg | ORAL_TABLET | ORAL | 0 refills | Status: DC | PRN

## 2023-01-08 NOTE — Progress Notes (Unsigned)
Subjective:    Patient ID: Brandon [name], male    DOB: 1940-11-30, 82 y.o.   MRN: [id number]  HPI: Brandon [name] is a 82 y.o. male who returns for follow up appointment for chronic pain and medication refill. states *** pain is located in  ***. rates pain ***. current exercise regime is walking and performing stretching exercises.  Brandon [name] Morphine equivalent is *** MME.   Last UDS was Performed on 11/14/2022, it was consistent.       Pain Inventory Average Pain 6 Pain Right Now 7 My pain is sharp, stabbing, and aching  In the last 24 hours, has pain interfered with the following? General activity 7 Relation with others 7 Enjoyment of life 7 What TIME of day is your pain at its worst? morning , daytime, evening, and night Sleep (in general) Fair  Pain is worse with: walking, bending, and standing Pain improves with: rest, heat/ice, and medication Relief from Meds: 6  Family History  Problem Relation Age of Onset   Heart disease Father 5       Living   Coronary artery disease Father        CABG   Alzheimer's disease Mother 49       Deceased   Arthritis Mother    Aneurysm Brother    Stomach cancer Maternal Uncle    Brain cancer Maternal Aunt        x2   Obesity Daughter        Had Bypass Sx   Social History   Socioeconomic History   Marital status: Married    Spouse name: etta   Number of children: 2   Years of education: Not on file   Highest education level: Not on file  Occupational History   Occupation: retired    Associate Professor: RETIRED    Comment: disabled due to back problems  Tobacco Use   Smoking status: Former    Current packs/day: 0.00    Average packs/day: 1.5 packs/day for 50.0 years (75.0 ttl pk-yrs)    Types: Cigarettes    Start date: 09/06/1967    Quit date: 09/05/2017    Years since quitting: 5.3    Passive exposure: Never   Smokeless tobacco: Never   Tobacco comments:    smoked less than 1 ppd for 40+ years;   Vaping Use   Vaping status:  Never Used  Substance and Sexual Activity   Alcohol use: Yes    Comment: rarely   Drug use: Yes    Types: Oxycodone, Morphine    Comment: has prescription   Sexual activity: Not Currently  Other Topics Concern   Not on file  Social History Narrative   ** Merged History Encounter **       Married (3rd), [place]. 2 children from 1st marriage, 4 step children.    Retired on disability due to back    Former Runner, broadcasting/film/video.   restores antique furniture for a hobby.       Cell # [phone]   Social Determinants of Health   Financial Resource Strain: Low Risk  (06/15/2022)   Overall Financial Resource Strain (CARDIA)    Difficulty of Paying Living Expenses: Not hard at all  Food Insecurity: No Food Insecurity (06/15/2022)   Hunger Vital Sign    Worried About Running Out of Food in the Last Year: Never true    Ran Out of Food in the Last Year: Never true  Transportation Needs: No  Transportation Needs (06/15/2022)   PRAPARE - Administrator, Civil Service (Medical): No    Lack of Transportation (Non-Medical): No  Physical Activity: Inactive (06/15/2022)   Exercise Vital Sign    Days of Exercise per Week: 0 days    Minutes of Exercise per Session: 0 min  Stress: No Stress Concern Present (06/15/2022)   [employer] of Occupational Health - Occupational Stress Questionnaire    Feeling of Stress : Not at all  Social Connections: Moderately Integrated (06/07/2021)   Social Connection and Isolation Panel [NHANES]    Frequency of Communication with Friends and Family: Twice a week    Frequency of Social Gatherings with Friends and Family: Twice a week    Attends Religious Services: More than 4 times per year    Active Member of [employer] or Organizations: No    Attends Banker Meetings: Never    Marital Status: Married   Past Surgical History:  Procedure Laterality Date   ABDOMINAL AORTIC ENDOVASCULAR STENT GRAFT N/A 03/03/2021   Procedure:  ABDOMINAL AORTIC ENDOVASCULAR STENT GRAFT;  Surgeon: Maeola Harman, MD;  Location: [facility] OR;  Service: Vascular;  Laterality: N/A;   ABDOMINAL AORTOGRAM W/LOWER EXTREMITY N/A 04/10/2021   Procedure: ABDOMINAL AORTOGRAM W/LOWER EXTREMITY;  Surgeon: Maeola Harman, MD;  Location: [facility] INVASIVE CV LAB;  Service: Cardiovascular;  Laterality: N/A;   BACK SURGERY     CARDIAC CATHETERIZATION     CATARACT EXTRACTION W/ INTRAOCULAR LENS  IMPLANT, BILATERAL Bilateral 01/2012 - 02/2012   CORONARY ATHERECTOMY N/A 04/27/2020   Procedure: CORONARY ATHERECTOMY;  Surgeon: Tonny Bollman, MD;  Location: [facility] INVASIVE CV LAB;  Service: Cardiovascular;  Laterality: N/A;   CORONARY IMAGING/OCT N/A 04/27/2020   Procedure: INTRAVASCULAR IMAGING/OCT;  Surgeon: Tonny Bollman, MD;  Location: [facility] INVASIVE CV LAB;  Service: Cardiovascular;  Laterality: N/A;   CORONARY PRESSURE/FFR STUDY N/A 04/27/2020   Procedure: INTRAVASCULAR PRESSURE WIRE/FFR STUDY;  Surgeon: Tonny Bollman, MD;  Location: [facility] INVASIVE CV LAB;  Service: Cardiovascular;  Laterality: N/A;   CORONARY STENT INTERVENTION N/A 04/27/2020   Procedure: CORONARY STENT INTERVENTION;  Surgeon: Tonny Bollman, MD;  Location: [facility] INVASIVE CV LAB;  Service: Cardiovascular;  Laterality: N/A;   DIRECT LARYNGOSCOPY Left 10/09/2017   Procedure: DIRECT LARYNGOSCOPY WITH BOPSY;  Surgeon: Flo Shanks, MD;  Location: [facility];  Service: ENT;  Laterality: Left;   ESOPHAGOGASTRODUODENOSCOPY (EGD) WITH ESOPHAGEAL DILATION     "couple times" (07/19/2016)   ESOPHAGOSCOPY Left 10/09/2017   Procedure: ESOPHAGOSCOPY;  Surgeon: Flo Shanks, MD;  Location: [facility];  Service: ENT;  Laterality: Left;   IR GASTROSTOMY TUBE MOD SED  01/08/2018   IR THORACENTESIS ASP PLEURAL SPACE W/IMG GUIDE  07/19/2016   LAPAROSCOPIC CHOLECYSTECTOMY  1984   LUMBAR DISC SURGERY  05/1996   L4-5; Dr. Rosanne Sack LAMINECTOMY/DECOMPRESSION  MICRODISCECTOMY  10/2002   L3-4. Dr. Newell Coral   MULTIPLE TOOTH EXTRACTIONS  1980s   PARTIAL GLOSSECTOMY  12/02/2017   Dr. Hezzie Bump- [facility]   pharyngoplasty for closure of tingue base defect  12/02/2017   Dr. Hezzie [facility]ump- [facility]   PICC LINE INSERTION  07/15/2016   POSTERIOR LUMBAR FUSION  09/1996   Ray cage, L4-5 Dr. Jule Ser   PROSTATE BIOPSY  ~ 2017   radical tonsillectomy Left 12/02/2017   [facility]zz[facility]ump [facility]   RIGHT HEART CATH AND CORONARY ANGIOGRAPHY N/A 03/31/2020   Procedure: RIGHT HEART CATH AND CORONARY ANGIOGRAPHY;  Surgeon: Laurey Morale, MD;  Location: [facility] INVASIVE CV LAB;  Service: Cardiovascular;  Laterality: N/A;   RIGID BRONCHOSCOPY Left 10/09/2017   Procedure: RIGID BRONCHOSCOPY;  Surgeon: Flo Shanks, MD;  Location: [facility];  Service: ENT;  Laterality: Left;   TEE WITHOUT CARDIOVERSION N/A 06/07/2020   Procedure: TRANSESOPHAGEAL ECHOCARDIOGRAM (TEE);  Surgeon: Tonny Bollman, MD;  Location: [facility] OR;  Service: Open Heart Surgery;  Laterality: N/A;   TONSILLECTOMY     TOTAL HIP ARTHROPLASTY Left 12/04/2020   Procedure: TOTAL HIP ARTHROPLASTY ANTERIOR APPROACH;  Surgeon: Samson Frederic, MD;  Location: [facility];  Service: Orthopedics;  Laterality: Left;   TOTAL HIP ARTHROPLASTY Left 11/2020   TRACHEOSTOMY  12/02/2017   Dr. Hezzie [facility]ump- [facility]   ULTRASOUND GUIDANCE FOR VASCULAR ACCESS Right 06/07/2020   Procedure: ULTRASOUND GUIDANCE FOR VASCULAR ACCESS;  Surgeon: Tonny Bollman, MD;  Location: [facility] OR;  Service: Open Heart Surgery;  Laterality: Right;   ULTRASOUND GUIDANCE FOR VASCULAR ACCESS Bilateral 03/03/2021   Procedure: ULTRASOUND GUIDANCE FOR VASCULAR ACCESS, BILATERAL FEMORAL ARTERIES;  Surgeon: Maeola Harman, MD;  Location: [facility] OR;  Service: Vascular;  Laterality: Bilateral;   VASCULAR SURGERY     Past Surgical History:  Procedure Laterality Date   ABDOMINAL AORTIC ENDOVASCULAR STENT GRAFT N/A 03/03/2021   Procedure: ABDOMINAL AORTIC  ENDOVASCULAR STENT GRAFT;  Surgeon: Maeola Harman, MD;  Location: [facility] OR;  Service: Vascular;  Laterality: N/A;   ABDOMINAL AORTOGRAM W/LOWER EXTREMITY N/A 04/10/2021   Procedure: ABDOMINAL AORTOGRAM W/LOWER EXTREMITY;  Surgeon: Maeola Harman, MD;  Location: [facility] INVASIVE CV LAB;  Service: Cardiovascular;  Laterality: N/A;   BACK SURGERY     CARDIAC CATHETERIZATION     CATARACT EXTRACTION W/ INTRAOCULAR LENS  IMPLANT, BILATERAL Bilateral 01/2012 - 02/2012   CORONARY ATHERECTOMY N/A 04/27/2020   Procedure: CORONARY ATHERECTOMY;  Surgeon: Tonny Bollman, MD;  Location: [facility] INVASIVE CV LAB;  Service: Cardiovascular;  Laterality: N/A;   CORONARY IMAGING/OCT N/A 04/27/2020   Procedure: INTRAVASCULAR IMAGING/OCT;  Surgeon: Tonny Bollman, MD;  Location: [facility] INVASIVE CV LAB;  Service: Cardiovascular;  Laterality: N/A;   CORONARY PRESSURE/FFR STUDY N/A 04/27/2020   Procedure: INTRAVASCULAR PRESSURE WIRE/FFR STUDY;  Surgeon: Tonny Bollman, MD;  Location: [facility] INVASIVE CV LAB;  Service: Cardiovascular;  Laterality: N/A;   CORONARY STENT INTERVENTION N/A 04/27/2020   Procedure: CORONARY STENT INTERVENTION;  Surgeon: Tonny Bollman, MD;  Location: [facility] INVASIVE CV LAB;  Service: Cardiovascular;  Laterality: N/A;   DIRECT LARYNGOSCOPY Left 10/09/2017   Procedure: DIRECT LARYNGOSCOPY WITH BOPSY;  Surgeon: Flo Shanks, MD;  Location: [facility];  Service: ENT;  Laterality: Left;   ESOPHAGOGASTRODUODENOSCOPY (EGD) WITH ESOPHAGEAL DILATION     "couple times" (07/19/2016)   ESOPHAGOSCOPY Left 10/09/2017   Procedure: ESOPHAGOSCOPY;  Surgeon: Flo Shanks, MD;  Location: [facility];  Service: ENT;  Laterality: Left;   IR GASTROSTOMY TUBE MOD SED  01/08/2018   IR THORACENTESIS ASP PLEURAL SPACE W/IMG GUIDE  07/19/2016   LAPAROSCOPIC CHOLECYSTECTOMY  1984   LUMBAR DISC SURGERY  05/1996   L4-5; Dr. Rosanne Sack LAMINECTOMY/DECOMPRESSION MICRODISCECTOMY   10/2002   L3-4. Dr. Newell Coral   MULTIPLE TOOTH EXTRACTIONS  1980s   PARTIAL GLOSSECTOMY  12/02/2017   Dr. [facility]zz[facility]ump- [facility]   pharyngoplasty for closure of tingue base defect  12/02/2017   Dr. Hezzie Bump- [facility]   PICC LINE INSERTION  07/15/2016   POSTERIOR LUMBAR FUSION  09/1996   Ray cage, L4-5 Dr. Jule Ser   PROSTATE BIOPSY  ~ 2017  radical tonsillectomy Left 12/02/2017   Dr. [facility]zzie Bump at [facility]   RIGHT HEART CATH AND CORONARY ANGIOGRAPHY N/A 03/31/2020   Procedure: RIGHT HEART CATH AND CORONARY ANGIOGRAPHY;  Surgeon: Laurey Morale, MD;  Location: [facility] INVASIVE CV LAB;  Service: Cardiovascular;  Laterality: N/A;   RIGID BRONCHOSCOPY Left 10/09/2017   Procedure: RIGID BRONCHOSCOPY;  Surgeon: Flo Shanks, MD;  Location: [facility];  Service: ENT;  Laterality: Left;   TEE WITHOUT CARDIOVERSION N/A 06/07/2020   Procedure: TRANSESOPHAGEAL ECHOCARDIOGRAM (TEE);  Surgeon: Tonny Bollman, MD;  Location: [facility] OR;  Service: Open Heart Surgery;  Laterality: N/A;   TONSILLECTOMY     TOTAL HIP ARTHROPLASTY Left 12/04/2020   Procedure: TOTAL HIP ARTHROPLASTY ANTERIOR APPROACH;  Surgeon: Samson Frederic, MD;  Location: [facility];  Service: Orthopedics;  Laterality: Left;   TOTAL HIP ARTHROPLASTY Left 11/2020   TRACHEOSTOMY  12/02/2017   Dr. Hezzie Bump- [facility]   ULTRASOUND GUIDANCE FOR VASCULAR ACCESS Right 06/07/2020   Procedure: ULTRASOUND GUIDANCE FOR VASCULAR ACCESS;  Surgeon: Tonny Bollman, MD;  Location: [facility] OR;  Service: Open Heart Surgery;  Laterality: Right;   ULTRASOUND GUIDANCE FOR VASCULAR ACCESS Bilateral 03/03/2021   Procedure: ULTRASOUND GUIDANCE FOR VASCULAR ACCESS, BILATERAL FEMORAL ARTERIES;  Surgeon: Maeola Harman, MD;  Location: [facility] OR;  Service: Vascular;  Laterality: Bilateral;   VASCULAR SURGERY     Past Medical History:  Diagnosis Date   Abdominal aneurysm (HCC)    Arthritis    "all over" (07/19/2016)   BPH (benign prostatic hypertrophy)    CAD (coronary  artery disease)    Chicken pox    Chronic lower back pain    s/p surgical fusion   Depression    Diverticulosis    Esophageal stricture    GERD (gastroesophageal reflux disease)    Hepatitis B 1984   Hiatal hernia    History of radiation therapy 01/16/18- 03/05/18   Left Tonsil, 66 Gy in 33 fractions to high risk nodal echelons.    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 Excell Seltzer and Dr Laneta Simmers    Severe aortic stenosis    TIA (transient ischemic attack) 1990s   hx   tonsillar ca dx'd 11/2017   Tubular adenoma of colon 2009   BP 134/68   Pulse 75   Ht 5\' 8"  (1.727 m)   Wt 145 lb (65.8 kg)   SpO2 93%   BMI 22.05 kg/m   Opioid Risk Score:   Fall Risk Score:  `1  Depression screen [facility] 2/9     01/08/2023    9:52 AM 09/24/2022   11:03 AM 07/16/2022   11:23 AM 06/26/2022    8:06 AM 06/15/2022    9:49 AM 06/11/2022    2:09 PM 05/16/2022   10:56 AM  Depression screen PHQ 2/9  Decreased Interest 0 0 2 1 0 0 1  Down, Depressed, Hopeless 0 0 1 1 0 0   PHQ - 2 Score 0 0 3 2 0 0 1  Altered sleeping    3     Tired, decreased energy    3     Change in appetite    3     Feeling bad or failure about yourself     2     Trouble concentrating  0     Moving slowly or fidgety/restless    1     Suicidal thoughts    0     PHQ-9 Score    14     Difficult doing work/chores    Somewhat difficult         Review of Systems  Musculoskeletal:  Positive for back pain and gait problem.  All other systems reviewed and are negative.     Objective:   Physical Exam        Assessment & Plan:

## 2023-01-08 NOTE — Telephone Encounter (Signed)
Refill request  traZODone (DESYREL) 50 MG tablet [[id number]]   [facility][id number] [place], [place] - [address] AT [facility] OF [facility] & [address] [place], [place] [address] Phone: [phone]  Fax: [phone]

## 2023-01-08 NOTE — Telephone Encounter (Signed)
LR  06/26/22 LOV  08/01/22 FOV   none scheduled.    Please review and advise.  Thanks. Dm/cma

## 2023-01-09 ENCOUNTER — Other Ambulatory Visit: Payer: Self-pay

## 2023-01-09 ENCOUNTER — Telehealth: Payer: Self-pay | Admitting: Registered Nurse

## 2023-01-09 DIAGNOSIS — L309 Dermatitis, unspecified: Secondary | ICD-10-CM

## 2023-01-09 MED ORDER — GABAPENTIN 300 MG PO CAPS: 300.0000 mg | ORAL_CAPSULE | Freq: Two times a day (BID) | ORAL | 2 refills | Status: DC

## 2023-01-09 MED ORDER — HYDROXYZINE HCL 10 MG PO TABS: 10.0000 mg | ORAL_TABLET | Freq: Every evening | ORAL | 3 refills | Status: AC | PRN

## 2023-01-09 NOTE — Telephone Encounter (Signed)
Patient notified VIA phone. Dm/cma  

## 2023-01-09 NOTE — Telephone Encounter (Signed)
Call placed to Mr. Padgett regarding Dr Berline Chough recommendation of Gabapentin 300 mg BID , he is in agreement with the above. Gabapentin prescription sent to the pharmacy, he verbalizes understanding.

## 2023-01-09 NOTE — Telephone Encounter (Signed)
Patient would like to get a refill on Hydroxizine 10 mg LR 04/03/22, #30, 3 rf LOV 5/21224 FOV  none scheduled.   He is having lots of skin itching and would like a refill.  Please review and advise.  Thanks. Dm/cma

## 2023-01-10 ENCOUNTER — Encounter: Payer: Self-pay | Admitting: Registered Nurse

## 2023-01-10 NOTE — Progress Notes (Unsigned)
Office Visit Note  Patient: Brandon [name]             Date of Birth: 11-01-1940           MRN: [id number]             PCP: Loyola Mast, MD Referring: Loyola Mast, MD Visit Date: 01/24/2023 Occupation: @GUAROCC @  Subjective:  Chronic pain   History of Present Illness: Brandon [name] is a 82 y.o. male with history of polymyalgia rheumatica and osteoarthritis.  Patient continues to have chronic pain involving multiple joints.  His pain has been most severe in his lower back radiating to his buttocks and knees.  He has difficulty ambulating and standing for long periods of time due to severity of pain.  He remains under the care of pain management.  He has been using a cane to assist with ambulation recently.  He has not had any recent falls.  Patient has noted some increased arthralgias with the weather changing.  He continues to have chronic pain involving both hands but denies any joint swelling.  He denies any signs or symptoms of a polymyalgia rheumatica flare.  He remains on prednisone 5 mg daily.    Activities of Daily Living:  Patient reports morning stiffness for 1 hour.   Patient Reports nocturnal pain.  Difficulty dressing/grooming: Denies Difficulty climbing stairs: Reports Difficulty getting out of chair: Reports Difficulty using hands for taps, buttons, cutlery, and/or writing: Reports  Review of Systems  Constitutional:  Positive for fatigue.  HENT:  Negative for mouth sores and mouth dryness.   Eyes:  Negative for dryness.  Respiratory:  Negative for shortness of breath.   Cardiovascular:  Negative for chest pain and palpitations.  Gastrointestinal:  Negative for blood in stool, constipation and diarrhea.  Endocrine: Negative for increased urination.  Genitourinary:  Negative for involuntary urination.  Musculoskeletal:  Positive for joint pain, gait problem, joint pain, myalgias, muscle weakness, morning stiffness, muscle tenderness and myalgias. Negative for  joint swelling.  Skin:  Negative for color change, rash, hair loss and sensitivity to sunlight.  Allergic/Immunologic: Negative for susceptible to infections.  Neurological:  Positive for dizziness. Negative for headaches.  Hematological:  Negative for swollen glands.  Psychiatric/Behavioral:  Positive for depressed mood and sleep disturbance. The patient is nervous/anxious.     PMFS History:  Patient Active Problem List   Diagnosis Date Noted   Age related osteoporosis 08/31/2022   Malnutrition of mild degree (HCC) 08/01/2022   Insomnia 06/26/2022   Arthritis of left hand 06/26/2022   Weight loss 06/26/2022   Chronic gout of multiple sites 04/30/2022   Acute exacerbation of chronic bronchitis (HCC) 04/03/2022   Chronically on opiate therapy 02/12/2022   Sacroiliac joint pain 02/12/2022   At risk for polypharmacy 01/19/2022   Aspiration pneumonia (HCC) 01/18/2022   Trochanteric bursitis of left hip 01/02/2022   Dysfunction of left eustachian tube 12/05/2021   Hypotension 08/18/2021   AKI (acute kidney injury) (HCC) 08/17/2021   Hyponatremia 06/26/2021   Hyperkalemia 06/26/2021   Acute metabolic encephalopathy 06/26/2021   Myoclonic jerking 06/26/2021   Lumbar radiculopathy 06/08/2021   Lumbar postlaminectomy syndrome 05/03/2021   Degeneration of lumbar intervertebral disc 05/03/2021   Lumbar spondylosis 05/03/2021   History of hip fracture 04/24/2021   S/P abdominal aortic aneurysm repair 04/24/2021   Chronic allergic rhinitis 01/24/2021   Demand ischemia (HCC)    Acute respiratory failure with hypoxia (HCC)    Elevated troponin  Chronic diastolic heart failure (HCC) 12/03/2020   Steatosis of liver 11/01/2020   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   Hepatitis B core antibody positive 11/01/2020   Benign prostatic hyperplasia 11/01/2020   Chronic obstructive pulmonary disease (HCC) 07/20/2020   History of placement of stent in LAD coronary artery 07/20/2020   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   TIA (transient ischemic attack)    Esophageal stricture    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   Primary osteoarthritis of both hands 01/11/2020   Piriformis syndrome of both sides 09/23/2019   Pain of both shoulder joints 07/29/2019   Sciatica associated with disorder of lumbar spine 04/24/2019   Primary osteoarthritis involving multiple joints 04/24/2019   Chronic pain syndrome 04/24/2019   Mixed anxiety and depressive disorder 04/15/2018   Oropharyngeal dysphagia 03/11/2018   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   Liver abscess 07/10/2016   Iron deficiency anemia 07/10/2016   Essential hypertension 05/19/2013   Psoriasis 01/13/2009   Coronary artery disease 09/15/2008   Diverticulosis of colon 08/27/2007   History of benign prostatic hyperplasia 08/27/2007   Hyperlipidemia 03/19/2007   Gastroesophageal reflux disease 03/19/2007    Past Medical History:  Diagnosis Date   Abdominal aneurysm (HCC)    Arthritis    "all over" (07/19/2016)   BPH (benign prostatic hypertrophy)    CAD (coronary artery disease)    Chicken pox    Chronic lower back pain    s/p surgical fusion   Depression    Diverticulosis    Esophageal stricture    GERD (gastroesophageal reflux disease)    Hepatitis B 1984   Hiatal hernia    History of radiation therapy 01/16/18- 03/05/18   Left Tonsil, 66 Gy in 33 fractions to high risk nodal echelons.    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 Excell Seltzer and Dr Laneta Simmers    Severe aortic stenosis    TIA (transient ischemic attack) 1990s   hx   tonsillar ca dx'd 11/2017   Tubular adenoma of colon 2009    Family History  Problem Relation Age of Onset   Heart disease Father 60       Living   Coronary  artery disease Father        CABG   Alzheimer's disease Mother 70       Deceased   Arthritis Mother    Aneurysm Brother    Stomach cancer Maternal Uncle    Brain cancer Maternal Aunt        x2   Obesity Daughter        Had Bypass Sx   Past Surgical History:  Procedure Laterality Date   ABDOMINAL AORTIC ENDOVASCULAR STENT GRAFT N/A 03/03/2021   Procedure: ABDOMINAL AORTIC ENDOVASCULAR STENT GRAFT;  Surgeon: Maeola Harman, MD;  Location: [facility] OR;  Service: Vascular;  Laterality: N/A;   ABDOMINAL AORTOGRAM W/LOWER EXTREMITY N/A 04/10/2021   Procedure: ABDOMINAL AORTOGRAM W/LOWER EXTREMITY;  Surgeon: Maeola Harman, MD;  Location: [facility] INVASIVE CV LAB;  Service: Cardiovascular;  Laterality: N/A;   BACK SURGERY     CARDIAC CATHETERIZATION     CATARACT EXTRACTION W/ INTRAOCULAR LENS  IMPLANT, BILATERAL Bilateral 01/2012 - 02/2012   CORONARY ATHERECTOMY N/A 04/27/2020   Procedure: CORONARY ATHERECTOMY;  Surgeon: Excell Seltzer,  Casimiro Needle, MD;  Location: [facility] INVASIVE CV LAB;  Service: Cardiovascular;  Laterality: N/A;   CORONARY IMAGING/OCT N/A 04/27/2020   Procedure: INTRAVASCULAR IMAGING/OCT;  Surgeon: Tonny Bollman, MD;  Location: [facility] INVASIVE CV LAB;  Service: Cardiovascular;  Laterality: N/A;   CORONARY PRESSURE/FFR STUDY N/A 04/27/2020   Procedure: INTRAVASCULAR PRESSURE WIRE/FFR STUDY;  Surgeon: Tonny Bollman, MD;  Location: [facility] INVASIVE CV LAB;  Service: Cardiovascular;  Laterality: N/A;   CORONARY STENT INTERVENTION N/A 04/27/2020   Procedure: CORONARY STENT INTERVENTION;  Surgeon: Tonny Bollman, MD;  Location: [facility] INVASIVE CV LAB;  Service: Cardiovascular;  Laterality: N/A;   DIRECT LARYNGOSCOPY Left 10/09/2017   Procedure: DIRECT LARYNGOSCOPY WITH BOPSY;  Surgeon: Flo Shanks, MD;  Location: [facility];  Service: ENT;  Laterality: Left;   ESOPHAGOGASTRODUODENOSCOPY (EGD) WITH ESOPHAGEAL DILATION     "couple times" (07/19/2016)   ESOPHAGOSCOPY Left  10/09/2017   Procedure: ESOPHAGOSCOPY;  Surgeon: Flo Shanks, MD;  Location: [facility];  Service: ENT;  Laterality: Left;   IR GASTROSTOMY TUBE MOD SED  01/08/2018   IR THORACENTESIS ASP PLEURAL SPACE W/IMG GUIDE  07/19/2016   LAPAROSCOPIC CHOLECYSTECTOMY  1984   LUMBAR DISC SURGERY  05/1996   L4-5; Dr. Rosanne Sack LAMINECTOMY/DECOMPRESSION MICRODISCECTOMY  10/2002   L3-4. Dr. Newell Coral   MULTIPLE TOOTH EXTRACTIONS  1980s   PARTIAL GLOSSECTOMY  12/02/2017   Dr. Hezzie Bump- [facility]   pharyngoplasty for closure of tingue base defect  12/02/2017   Dr. Hezzie Bump- [facility]   PICC LINE INSERTION  07/15/2016   POSTERIOR LUMBAR FUSION  09/1996   Ray cage, L4-5 Dr. Jule Ser   PROSTATE BIOPSY  ~ 2017   radical tonsillectomy Left 12/02/2017   Dr. Hezzie Bump at [facility]   RIGHT HEART CATH AND CORONARY ANGIOGRAPHY N/A 03/31/2020   Procedure: RIGHT HEART CATH AND CORONARY ANGIOGRAPHY;  Surgeon: Laurey Morale, MD;  Location: [facility] INVASIVE CV LAB;  Service: Cardiovascular;  Laterality: N/A;   RIGID BRONCHOSCOPY Left 10/09/2017   Procedure: RIGID BRONCHOSCOPY;  Surgeon: Flo Shanks, MD;  Location: [facility];  Service: ENT;  Laterality: Left;   TEE WITHOUT CARDIOVERSION N/A 06/07/2020   Procedure: TRANSESOPHAGEAL ECHOCARDIOGRAM (TEE);  Surgeon: Tonny Bollman, MD;  Location: [facility] OR;  Service: Open Heart Surgery;  Laterality: N/A;   TONSILLECTOMY     TOTAL HIP ARTHROPLASTY Left 12/04/2020   Procedure: TOTAL HIP ARTHROPLASTY ANTERIOR APPROACH;  Surgeon: Samson Frederic, MD;  Location: [facility] ORS;  Service: Orthopedics;  Laterality: Left;   TOTAL HIP ARTHROPLASTY Left 11/2020   TRACHEOSTOMY  12/02/2017   Dr. Hezzie [facility]ump- [facility]   ULTRASOUND GUIDANCE FOR VASCULAR ACCESS Right 06/07/2020   Procedure: ULTRASOUND GUIDANCE FOR VASCULAR ACCESS;  Surgeon: Tonny Bollman, MD;  Location: [facility] OR;  Service: Open Heart Surgery;  Laterality: Right;   ULTRASOUND GUIDANCE FOR VASCULAR ACCESS  Bilateral 03/03/2021   Procedure: ULTRASOUND GUIDANCE FOR VASCULAR ACCESS, BILATERAL FEMORAL ARTERIES;  Surgeon: Maeola Harman, MD;  Location: [facility] OR;  Service: Vascular;  Laterality: Bilateral;   VASCULAR SURGERY     Social History   Social History Narrative   ** Merged History Encounter **       Married (3rd), [place]. 2 children from 1st marriage, 4 step children.    Retired on disability due to back    Former Runner, broadcasting/film/video.   restores antique furniture for a hobby.       Cell # [id number]   Immunization History  Administered Date(s) Administered  Fluad Quad(high Dose 65+) 10/15/2018, 11/06/2019, 10/20/2020   Influenza Split 11/26/2010, 10/11/2011, 11/03/2012   Influenza Whole 11/14/2006, 12/04/2007, 10/06/2008, 10/25/2009, 11/26/2021   Influenza, High Dose Seasonal PF 11/01/2016, 10/30/2017   Influenza, Seasonal, Injecte, Preservative Fre 12/07/2014   Influenza,inj,Quad PF,6+ Mos 11/04/2015   Influenza-Unspecified 11/19/2013   PFIZER(Purple Top)SARS-COV-2 Vaccination 02/25/2019, 03/18/2019, 11/16/2019   Pneumococcal Conjugate-13 12/07/2014   Pneumococcal Polysaccharide-23 05/30/2017   Tdap 01/28/2020     Objective: Vital Signs: BP 133/65 (BP Location: Left Arm, Patient Position: Sitting, Cuff Size: Normal)   Pulse 75   Resp 14   Ht 5\' 8"  (1.727 m)   Wt 150 lb 12.8 oz (68.4 kg)   BMI 22.93 kg/m    Physical Exam Vitals and nursing note reviewed.  Constitutional:      Appearance: He is well-developed.  HENT:     Head: Normocephalic and atraumatic.  Eyes:     Conjunctiva/sclera: Conjunctivae normal.     Pupils: Pupils are equal, round, and reactive to light.  Cardiovascular:     Rate and Rhythm: Normal rate and regular rhythm.     Heart sounds: Normal heart sounds.  Pulmonary:     Effort: Pulmonary effort is normal.     Breath sounds: Normal breath sounds.  Abdominal:     General: Bowel sounds are normal.     Palpations: Abdomen  is soft.  Musculoskeletal:     Cervical back: Normal range of motion and neck supple.  Skin:    General: Skin is warm and dry.     Capillary Refill: Capillary refill takes less than 2 seconds.  Neurological:     Mental Status: He is alert and oriented to person, place, and time.  Psychiatric:        Behavior: Behavior normal.      Musculoskeletal Exam: C-spine has limited range of motion especially with flexion and extension.  Limited mobility of the lumbar spine.  Shoulder joints have good range of motion with some discomfort and stiffness bilaterally.  Elbow joints, wrist joints, MCPs, PIPs, DIPs have good range of motion with no synovitis.  PIP and DIP thickening consistent with osteoarthritis of both hands.  Left hip replacement and right hip joint to have slightly limited range of motion but no groin pain currently.  Knee joints have good range of motion no warmth or effusion.  Ankle joints have good range of motion with no tenderness or joint swelling.  CDAI Exam: CDAI Score: -- Patient Global: --; Provider Global: -- Swollen: --; Tender: -- Joint Exam 01/24/2023   No joint exam has been documented for this visit   There is currently no information documented on the homunculus. Go to the Rheumatology activity and complete the homunculus joint exam.  Investigation: No additional findings.  Imaging: No results found.  Recent Labs: Lab Results  Component Value Date   WBC 8.9 08/22/2022   HGB 11.5 (L) 08/22/2022   PLT 142 08/22/2022   NA 140 08/22/2022   K 3.5 08/22/2022   CL 102 08/22/2022   CO2 31 08/22/2022   GLUCOSE 192 (H) 08/22/2022   BUN 19 08/22/2022   CREATININE 0.72 08/22/2022   BILITOT 0.4 08/22/2022   ALKPHOS 127 (H) 06/26/2022   AST 18 08/22/2022   ALT 19 08/22/2022   PROT 5.9 (L) 08/22/2022   PROT 6.0 (L) 08/22/2022   ALBUMIN 3.4 (L) 06/26/2022   CALCIUM 8.8 08/22/2022   GFRAA 107 08/11/2020   QFTBGOLDPLUS NEGATIVE 01/11/2020    Speciality  Comments:  Fosamax started on January 26, 2021.  If he has dysphagia with Fosamax or any GI intolerance we will have to switch him to Reclast.  Procedures:  No procedures performed Allergies: Tape and Celebrex [celecoxib]   Assessment / Plan:     Visit Diagnoses: Polymyalgia rheumatica (HCC) - History of muscle weakness and tenderness, elevated sed rate. Prednisone responsive: Patient is currently taking prednisone 5 mg daily.  He has not had any signs or symptoms of a flare since reducing the dose of prednisone from 6 mg to 5 mg daily.  He continues to have chronic pain involving multiple joints.  He has severe pain involving his lower back and remains under the care of pain management. He is also having persistent pain involving both hands but has no synovitis on exam.   Plan to check ESR today.  He will remain on prednisone 5 mg daily.  He was advised to notify us if he develops signs or symptoms of a flare.  He will follow up in 3 months or sooner if needed. - Plan: Sedimentation rate  Long term (current) use of systemic steroids - Prednisone 5 mg daily. He is aware of the risks of long term prednisone use.  Initiated IV reclast on 09/11/22.  Primary osteoarthritis of both hands: Chronic pain.  PIP and DIP thickening consistent with osteoarthritis of both hands.  No inflammation noted.   Status post total hip replacement, left - 12/04/20--Good ROM with no discomfort.    Degeneration of intervertebral disc of lumbar region without discogenic back pain or lower extremity pain: No symptoms of radiculopathy.   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  He remains on allopurinol 200 mg by mouth daily.  He has not had any signs or symptoms of a gout flare.  Uric acid will be updated today.  - Plan: Uric acid  Medication monitoring encounter -CBC and CMP updated  today.  Plan: CBC with Differential/Platelet, COMPLETE METABOLIC PANEL WITH GFR  Age-related osteoporosis without current pathological fracture: DEXA updated on 07/27/2022: Right femoral neck BMD 0.558 with T-score -2.7.  -20% change in BMD for right total femur.  Previous DEXA scan 07/21/20: showed T score -2.2 in the right femoral neck, BMD 0.638.  He was started on Fosamax 70 mg December 2022-only took initial prescription prescribed.  History of GERD.   Long term prednisonse use/history of PMR. Hx of hip fracture after a fall.  Plan to avoid anabolic agents given history of radiation therapy. He had his first IV Reclast infusion on 09/11/2022.  Vitamin D deficiency  Chronic pain syndrome - Under the care of pain management--Dr. Berline Chough  Psoriasis: No active psoriasis at this time.   Other medical conditions are listed as follows:  Oropharyngeal dysphagia  Liver abscess  Aneurysm of infrarenal abdominal aorta, unspecified whether ruptured (HCC)  History of diverticulitis  History of gastroesophageal reflux (GERD)  History of aortic valve replacement  History of hyperlipidemia  Essential hypertension: Blood pressure was 133/65 today in the office.  Cancer of tonsillar fossa (HCC)  Anxiety and depression  History of atherectomy  Former smoker  History of iron deficiency anemia    Orders: Orders Placed This Encounter  Procedures   CBC with Differential/Platelet   COMPLETE METABOLIC PANEL WITH GFR   Uric acid   Sedimentation rate   No orders of the defined types were placed in this encounter.  Follow-Up Instructions: Return in about 3 months (around 04/24/2023) for Polymyalgia Rheumatica, Osteoarthritis.   Gearldine Bienenstock, PA-C  Note - This record has been created using Dragon software.  Chart creation errors have been sought, but may not always  have been located. Such creation errors do not reflect on  the standard of medical care.

## 2023-01-11 ENCOUNTER — Telehealth: Payer: Self-pay

## 2023-01-11 ENCOUNTER — Other Ambulatory Visit (HOSPITAL_COMMUNITY): Payer: Self-pay

## 2023-01-11 ENCOUNTER — Telehealth: Payer: Self-pay | Admitting: Registered Nurse

## 2023-01-11 NOTE — Telephone Encounter (Signed)
Call placed to Brandon [name], no answer.  [facility] was called , they hung up on this provider twice.

## 2023-01-11 NOTE — Telephone Encounter (Signed)
Pharmacy Patient Advocate Encounter   Received notification from CoverMyMeds that prior authorization for hydrOXYzine HCl 10MG  tablets is required/requested.   Insurance verification completed.   The patient is insured through [employer] .   Per test claim: PA required; PA submitted to above mentioned insurance via CoverMyMeds Key/confirmation #/EOC [id number] Status is pending

## 2023-01-13 ENCOUNTER — Telehealth: Payer: Self-pay | Admitting: Registered Nurse

## 2023-01-13 NOTE — Telephone Encounter (Signed)
[facility] was called, the representative states the Morphine is on back order.  Call placed to Mr Witmer, no answer. Left message, will try again. Need to know what pharmacy is near Mr. Scarpulla home.

## 2023-01-14 ENCOUNTER — Telehealth: Payer: Self-pay | Admitting: Family Medicine

## 2023-01-14 NOTE — Telephone Encounter (Signed)
Can you let me know about this- if he got meds??- Thanks- ML

## 2023-01-14 NOTE — Telephone Encounter (Addendum)
I have spoken to Mr. Yarter. He stated his local pharmacy does not have the Morphine in stock right now. He will call around to see who does have it, in the next couple of days. Right not he's not doing much and feeling okay. So he does not need the Morphine, at this time.   Call back phone [phone].  Riley Lam NP is out of the office today).

## 2023-01-14 NOTE — Telephone Encounter (Signed)
Pts hydrOXYzine (ATARAX) 10 MG tablet [[id number]] has been approved asof 01/11/23 to 01/11/24.

## 2023-01-16 MED ORDER — MORPHINE SULFATE ER 15 MG PO TBCR: 15.0000 mg | EXTENDED_RELEASE_TABLET | Freq: Two times a day (BID) | ORAL | 0 refills | Status: DC

## 2023-01-16 NOTE — Telephone Encounter (Signed)
Please send New Rx for Morph[facility] to [facility] [address].  Prior Rx has been cancelled.

## 2023-01-16 NOTE — Addendum Note (Signed)
Addended by: Genice Rouge on: 01/16/2023 12:46 PM   Modules accepted: Orders

## 2023-01-16 NOTE — Addendum Note (Signed)
Addended by: Becky Sax on: 01/16/2023 12:12 PM   Modules accepted: Orders

## 2023-01-20 ENCOUNTER — Other Ambulatory Visit: Payer: Self-pay | Admitting: Physician Assistant

## 2023-01-21 NOTE — Telephone Encounter (Signed)
Last Fill: 10/23/2022  Labs: 08/22/2022  Glucose is 192. Total protein is borderline low-6.0 but has improved. Rest of CMP WNL Anemia is improving. Absolute lymphocytes are borderline low-we will continue to monitor. 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   Okay to refill Allopurinol?

## 2023-01-21 NOTE — Telephone Encounter (Signed)
Pharmacy Patient Advocate Encounter  Received notification from [facility] that Prior Authorization for hydrOXYzine HCl 10MG  tablets has been APPROVED from 01-14-2023 to 01-11-2024   PA #/Case ID/Reference #: [id number]

## 2023-01-21 NOTE — Telephone Encounter (Signed)
Pharmacy notified [facility] [place] phone. Dm/cma

## 2023-01-22 ENCOUNTER — Encounter: Payer: Medicare Other | Admitting: Registered Nurse

## 2023-01-24 ENCOUNTER — Encounter: Payer: Self-pay | Admitting: Physician Assistant

## 2023-01-24 ENCOUNTER — Ambulatory Visit: Payer: Medicare Other | Attending: Physician Assistant | Admitting: Physician Assistant

## 2023-01-24 VITALS — BP 133/65 | HR 75 | Resp 14 | Ht 68.0 in | Wt 150.8 lb

## 2023-01-24 DIAGNOSIS — M353 Polymyalgia rheumatica: Secondary | ICD-10-CM

## 2023-01-24 DIAGNOSIS — Z5181 Encounter for therapeutic drug level monitoring: Secondary | ICD-10-CM | POA: Diagnosis not present

## 2023-01-24 DIAGNOSIS — I10 Essential (primary) hypertension: Secondary | ICD-10-CM

## 2023-01-24 DIAGNOSIS — Z8719 Personal history of other diseases of the digestive system: Secondary | ICD-10-CM

## 2023-01-24 DIAGNOSIS — M19042 Primary osteoarthritis, left hand: Secondary | ICD-10-CM

## 2023-01-24 DIAGNOSIS — E559 Vitamin D deficiency, unspecified: Secondary | ICD-10-CM

## 2023-01-24 DIAGNOSIS — M810 Age-related osteoporosis without current pathological fracture: Secondary | ICD-10-CM

## 2023-01-24 DIAGNOSIS — Z96642 Presence of left artificial hip joint: Secondary | ICD-10-CM | POA: Diagnosis not present

## 2023-01-24 DIAGNOSIS — L409 Psoriasis, unspecified: Secondary | ICD-10-CM

## 2023-01-24 DIAGNOSIS — Z7952 Long term (current) use of systemic steroids: Secondary | ICD-10-CM | POA: Diagnosis not present

## 2023-01-24 DIAGNOSIS — M19041 Primary osteoarthritis, right hand: Secondary | ICD-10-CM

## 2023-01-24 DIAGNOSIS — M1A0210 Idiopathic chronic gout, right elbow, without tophus (tophi): Secondary | ICD-10-CM | POA: Diagnosis not present

## 2023-01-24 DIAGNOSIS — Z862 Personal history of diseases of the blood and blood-forming organs and certain disorders involving the immune mechanism: Secondary | ICD-10-CM

## 2023-01-24 DIAGNOSIS — Z87891 Personal history of nicotine dependence: Secondary | ICD-10-CM

## 2023-01-24 DIAGNOSIS — Z8639 Personal history of other endocrine, nutritional and metabolic disease: Secondary | ICD-10-CM

## 2023-01-24 DIAGNOSIS — Z952 Presence of prosthetic heart valve: Secondary | ICD-10-CM

## 2023-01-24 DIAGNOSIS — C090 Malignant neoplasm of tonsillar fossa: Secondary | ICD-10-CM

## 2023-01-24 DIAGNOSIS — I7143 Infrarenal abdominal aortic aneurysm, without rupture: Secondary | ICD-10-CM

## 2023-01-24 DIAGNOSIS — F419 Anxiety disorder, unspecified: Secondary | ICD-10-CM

## 2023-01-24 DIAGNOSIS — Z9889 Other specified postprocedural states: Secondary | ICD-10-CM

## 2023-01-24 DIAGNOSIS — M51369 Other intervertebral disc degeneration, lumbar region without mention of lumbar back pain or lower extremity pain: Secondary | ICD-10-CM

## 2023-01-24 DIAGNOSIS — G894 Chronic pain syndrome: Secondary | ICD-10-CM

## 2023-01-24 DIAGNOSIS — R1312 Dysphagia, oropharyngeal phase: Secondary | ICD-10-CM

## 2023-01-24 DIAGNOSIS — K750 Abscess of liver: Secondary | ICD-10-CM

## 2023-01-24 DIAGNOSIS — F32A Depression, unspecified: Secondary | ICD-10-CM

## 2023-01-25 LAB — CBC WITH DIFFERENTIAL/PLATELET
Absolute Lymphocytes: 525 {cells}/uL — ABNORMAL LOW (ref 850–3900)
Absolute Monocytes: 329 {cells}/uL (ref 200–950)
Basophils Absolute: 7 {cells}/uL (ref 0–200)
Basophils Relative: 0.1 %
Eosinophils Absolute: 140 {cells}/uL (ref 15–500)
Eosinophils Relative: 2 %
HCT: 36.6 % — ABNORMAL LOW (ref 38.5–50.0)
Hemoglobin: 11.6 g/dL — ABNORMAL LOW (ref 13.2–17.1)
MCH: 27.4 pg (ref 27.0–33.0)
MCHC: 31.7 g/dL — ABNORMAL LOW (ref 32.0–36.0)
MCV: 86.5 fL (ref 80.0–100.0)
MPV: 10.2 fL (ref 7.5–12.5)
Monocytes Relative: 4.7 %
Neutro Abs: 5999 {cells}/uL (ref 1500–7800)
Neutrophils Relative %: 85.7 %
Platelets: 112 10*3/uL — ABNORMAL LOW (ref 140–400)
RBC: 4.23 10*6/uL (ref 4.20–5.80)
RDW: 12.8 % (ref 11.0–15.0)
Total Lymphocyte: 7.5 %
WBC: 7 10*3/uL (ref 3.8–10.8)

## 2023-01-25 LAB — COMPLETE METABOLIC PANEL WITH GFR
AG Ratio: 1.5 (calc) (ref 1.0–2.5)
ALT: 6 U/L — ABNORMAL LOW (ref 9–46)
AST: 9 U/L — ABNORMAL LOW (ref 10–35)
Albumin: 3.3 g/dL — ABNORMAL LOW (ref 3.6–5.1)
Alkaline phosphatase (APISO): 112 U/L (ref 35–144)
BUN/Creatinine Ratio: 18 (calc) (ref 6–22)
BUN: 12 mg/dL (ref 7–25)
CO2: 31 mmol/L (ref 20–32)
Calcium: 8.1 mg/dL — ABNORMAL LOW (ref 8.6–10.3)
Chloride: 101 mmol/L (ref 98–110)
Creat: 0.66 mg/dL — ABNORMAL LOW (ref 0.70–1.22)
Globulin: 2.2 g/dL (ref 1.9–3.7)
Glucose, Bld: 166 mg/dL — ABNORMAL HIGH (ref 65–99)
Potassium: 3.7 mmol/L (ref 3.5–5.3)
Sodium: 139 mmol/L (ref 135–146)
Total Bilirubin: 0.8 mg/dL (ref 0.2–1.2)
Total Protein: 5.5 g/dL — ABNORMAL LOW (ref 6.1–8.1)
eGFR: 95 mL/min/{1.73_m2} (ref >=60)

## 2023-01-25 LAB — SEDIMENTATION RATE: Sed Rate: 29 mm/h — ABNORMAL HIGH (ref 0–20)

## 2023-01-25 LAB — URIC ACID: Uric Acid, Serum: 4.4 mg/dL (ref 4.0–8.0)

## 2023-01-25 NOTE — Progress Notes (Signed)
Uric acid is within the desirable range.  ESR remains borderline elevated but has improved.   Patient remains anemic--stable.   Platelet count is low-112K.  Absolute lymphocytes are low.  Glucose is 166.   Calcium is low-please clarify if he is taking a calcium supplement? Total protein is also low.

## 2023-02-01 ENCOUNTER — Other Ambulatory Visit (HOSPITAL_COMMUNITY): Payer: Self-pay | Admitting: Cardiology

## 2023-02-01 DIAGNOSIS — I5032 Chronic diastolic (congestive) heart failure: Secondary | ICD-10-CM

## 2023-02-04 DIAGNOSIS — L820 Inflamed seborrheic keratosis: Secondary | ICD-10-CM | POA: Diagnosis not present

## 2023-02-11 ENCOUNTER — Encounter: Payer: Self-pay | Admitting: Family Medicine

## 2023-02-11 ENCOUNTER — Ambulatory Visit (INDEPENDENT_AMBULATORY_CARE_PROVIDER_SITE_OTHER): Payer: Medicare Other | Admitting: Family Medicine

## 2023-02-11 VITALS — BP 134/62 | HR 80 | Temp 97.9°F | Ht 68.0 in | Wt 151.0 lb

## 2023-02-11 DIAGNOSIS — R739 Hyperglycemia, unspecified: Secondary | ICD-10-CM | POA: Diagnosis not present

## 2023-02-11 DIAGNOSIS — M170 Bilateral primary osteoarthritis of knee: Secondary | ICD-10-CM

## 2023-02-11 DIAGNOSIS — M7582 Other shoulder lesions, left shoulder: Secondary | ICD-10-CM

## 2023-02-11 DIAGNOSIS — E441 Mild protein-calorie malnutrition: Secondary | ICD-10-CM

## 2023-02-11 DIAGNOSIS — E538 Deficiency of other specified B group vitamins: Secondary | ICD-10-CM | POA: Insufficient documentation

## 2023-02-11 DIAGNOSIS — J449 Chronic obstructive pulmonary disease, unspecified: Secondary | ICD-10-CM

## 2023-02-11 DIAGNOSIS — D638 Anemia in other chronic diseases classified elsewhere: Secondary | ICD-10-CM | POA: Diagnosis not present

## 2023-02-11 DIAGNOSIS — Z630 Problems in relationship with spouse or partner: Secondary | ICD-10-CM

## 2023-02-11 DIAGNOSIS — R634 Abnormal weight loss: Secondary | ICD-10-CM

## 2023-02-11 DIAGNOSIS — J439 Emphysema, unspecified: Secondary | ICD-10-CM

## 2023-02-11 DIAGNOSIS — M353 Polymyalgia rheumatica: Secondary | ICD-10-CM | POA: Diagnosis not present

## 2023-02-11 DIAGNOSIS — R490 Dysphonia: Secondary | ICD-10-CM

## 2023-02-11 LAB — VITAMIN B12: Vitamin B-12: 179 pg/mL — ABNORMAL LOW (ref 211–911)

## 2023-02-11 LAB — HEMOGLOBIN A1C: Hgb A1c MFr Bld: 6.3 % (ref 4.6–6.5)

## 2023-02-11 LAB — FOLATE: Folate: 11.1 ng/mL (ref >5.9)

## 2023-02-11 MED ORDER — FLUOXETINE HCL 20 MG PO CAPS: 20.0000 mg | ORAL_CAPSULE | Freq: Every day | ORAL | 3 refills | Status: DC

## 2023-02-11 MED ORDER — ALBUTEROL SULFATE HFA 108 (90 BASE) MCG/ACT IN AERS: 2.0000 | INHALATION_SPRAY | Freq: Four times a day (QID) | RESPIRATORY_TRACT | 2 refills | Status: DC | PRN

## 2023-02-11 MED ORDER — VITAMIN B-12 1000 MCG PO TABS: 1000.0000 ug | ORAL_TABLET | Freq: Every day | ORAL | 3 refills | Status: AC

## 2023-02-11 NOTE — Assessment & Plan Note (Signed)
 Likely secondary to chronic prednisone use. I will check an A1c today.

## 2023-02-11 NOTE — Assessment & Plan Note (Signed)
 Brandon [name] weight loss has stabilized over the past 6 months.

## 2023-02-11 NOTE — Addendum Note (Signed)
 Addended by: Loyola Mast on: 02/11/2023 05:08 PM   Modules accepted: Orders

## 2023-02-11 NOTE — Assessment & Plan Note (Signed)
 Stable. Currently on prednisone 5 mg daily. Continue to follow with rheumatology.

## 2023-02-11 NOTE — Assessment & Plan Note (Signed)
 Stable. I will reassess his iron profile, B12, and folate to assure there are no other nutritional causes.

## 2023-02-11 NOTE — Assessment & Plan Note (Signed)
 [name] protein levels remain mildly low. I encouraged him about his dietary efforts. His weight is currently stable. His low albumin makes his calcium level appear low. However, his corrected calcium is 8.66, which is within a normal range.

## 2023-02-11 NOTE — Assessment & Plan Note (Signed)
 COPD is stable. I will renew his albuterol. I advised him to quit smoking.

## 2023-02-11 NOTE — Progress Notes (Signed)
 [facility] [address] [place] [address] Dept: [phone] Dept Fax: [phone]  Chronic Care Office Visit  Subjective:    Patient ID: Brandon [name], male    DOB: [birth year], 83 y.o..   MRN: [id number]  Chief Complaint  Patient presents with   Follow-up    F/u meds.     History of Present Illness:  Patient is in today with multiple issues he wants to discuss.  Brandon [name] is managed by physiatry related to chronic pain, esp. in his lumbar spine. He is currently prescribed MS Contin  15 mg bid and oxycodone  10 mg 5 tablets a day (every 4 hours). He notes he has been called for jury duty, but doesn't feel he could tolerate this.   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 He has not felt any masses developing in the neck or jaw. He has noted episodes of a croupy cough and hoarseness. He notes he has an appointment to see his head and neck surgeon next month.   Brandon [name] experienced 38 lbs of weight loss from 11/2020-07/2022. He is now including supplements daily.  Brandon [name] has a history of COPD. He is managed on albuterol  and tiotropium. He admits that more recently, he has started back to smoking again, which has been in response to marital discord.  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 He is managed on prednisone  currently at 5 mg daily.   Related to his marital issues, Brandon [name] notes that he and his wife are regularly at odds. He notes examples of how he feels she looks for reasons to be upset with him. In response he often spends his day in his shop working on projects, rather than spend time with her. They sleep in separate bedrooms, yet she complains of his use of a rain-noise generator that he uses. 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 He feels this is leading to depressive feelings. He has spoken some with his brother, who has a background in psychology.  Past Medical  History: Patient Active Problem List   Diagnosis Date Noted   Anemia of chronic disease 02/11/2023   Hyperglycemia 02/11/2023   Age related osteoporosis 08/31/2022   Malnutrition of mild degree (HCC) 08/01/2022   Insomnia 06/26/2022   Arthritis of left hand 06/26/2022   Weight loss 06/26/2022   Chronic gout of multiple sites 04/30/2022   Acute exacerbation of chronic bronchitis (HCC) 04/03/2022   Chronically on opiate therapy 02/12/2022   Sacroiliac joint pain 02/12/2022   At risk for polypharmacy 01/19/2022   Aspiration pneumonia (HCC) 01/18/2022   Trochanteric bursitis of left hip 01/02/2022   Dysfunction of left eustachian tube 12/05/2021   Hypotension 08/18/2021   AKI (acute kidney injury) (HCC) 08/17/2021   Hyponatremia 06/26/2021   Hyperkalemia 06/26/2021   Acute metabolic encephalopathy 06/26/2021   Myoclonic jerking 06/26/2021   Lumbar radiculopathy 06/08/2021   Lumbar postlaminectomy syndrome 05/03/2021   Degeneration of lumbar intervertebral disc 05/03/2021   Lumbar spondylosis 05/03/2021   History of hip fracture 04/24/2021   S/P abdominal aortic aneurysm repair 04/24/2021   Chronic allergic rhinitis 01/24/2021   Demand ischemia (HCC)    Acute respiratory failure with hypoxia (HCC)    Elevated troponin    Chronic diastolic heart failure (HCC) 12/03/2020   Steatosis of liver 11/01/2020   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   Hepatitis B core  antibody positive 11/01/2020   Benign prostatic hyperplasia 11/01/2020   Chronic obstructive pulmonary disease (HCC) 07/20/2020   History of placement of stent in LAD coronary artery 07/20/2020   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   TIA (transient ischemic attack)    Esophageal stricture    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   Primary osteoarthritis of both hands 01/11/2020   Piriformis syndrome of both sides 09/23/2019   Pain of both shoulder joints 07/29/2019   Sciatica  associated with disorder of lumbar spine 04/24/2019   Primary osteoarthritis involving multiple joints 04/24/2019   Chronic pain syndrome 04/24/2019   Mixed anxiety and depressive disorder 04/15/2018   Oropharyngeal dysphagia 03/11/2018   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   Liver abscess 07/10/2016   Tobacco abuse disorder 01/10/2014   Essential hypertension 05/19/2013   Psoriasis 01/13/2009   Coronary artery disease 09/15/2008   Diverticulosis of colon 08/27/2007   History of benign prostatic hyperplasia 08/27/2007   Hyperlipidemia 03/19/2007   Gastroesophageal reflux disease 03/19/2007   Past Surgical History:  Procedure Laterality Date   ABDOMINAL AORTIC ENDOVASCULAR STENT GRAFT N/A 03/03/2021   Procedure: ABDOMINAL AORTIC ENDOVASCULAR STENT GRAFT;  Surgeon: Sheree Penne Bruckner, MD;  Location: [facility] OR;  Service: Vascular;  Laterality: N/A;   ABDOMINAL AORTOGRAM W/LOWER EXTREMITY N/A 04/10/2021   Procedure: ABDOMINAL AORTOGRAM W/LOWER EXTREMITY;  Surgeon: Sheree Penne Bruckner, MD;  Location: [facility] INVASIVE CV LAB;  Service: Cardiovascular;  Laterality: N/A;   BACK SURGERY     CARDIAC CATHETERIZATION     CATARACT EXTRACTION W/ INTRAOCULAR LENS  IMPLANT, BILATERAL Bilateral 01/2012 - 02/2012   CORONARY ATHERECTOMY N/A 04/27/2020   Procedure: CORONARY ATHERECTOMY;  Surgeon: Wonda Sharper, MD;  Location: [facility] INVASIVE CV LAB;  Service: Cardiovascular;  Laterality: N/A;   CORONARY IMAGING/OCT N/A 04/27/2020   Procedure: INTRAVASCULAR IMAGING/OCT;  Surgeon: Wonda Sharper, MD;  Location: [facility] INVASIVE CV LAB;  Service: Cardiovascular;  Laterality: N/A;   CORONARY PRESSURE/FFR STUDY N/A 04/27/2020   Procedure: INTRAVASCULAR PRESSURE WIRE/FFR STUDY;  Surgeon: Wonda Sharper, MD;  Location: [facility] INVASIVE CV LAB;  Service: Cardiovascular;  Laterality: N/A;   CORONARY STENT INTERVENTION N/A 04/27/2020   Procedure: CORONARY STENT INTERVENTION;  Surgeon: Wonda Sharper, MD;  Location:  [facility] INVASIVE CV LAB;  Service: Cardiovascular;  Laterality: N/A;   DIRECT LARYNGOSCOPY Left 10/09/2017   Procedure: DIRECT LARYNGOSCOPY WITH BOPSY;  Surgeon: Arlana Arnt, MD;  Location: [facility];  Service: ENT;  Laterality: Left;   ESOPHAGOGASTRODUODENOSCOPY (EGD) WITH ESOPHAGEAL DILATION     couple times (07/19/2016)   ESOPHAGOSCOPY Left 10/09/2017   Procedure: ESOPHAGOSCOPY;  Surgeon: Arlana Arnt, MD;  Location: [facility];  Service: ENT;  Laterality: Left;   IR GASTROSTOMY TUBE MOD SED  01/08/2018   IR THORACENTESIS ASP PLEURAL SPACE W/IMG GUIDE  07/19/2016   LAPAROSCOPIC CHOLECYSTECTOMY  1984   LUMBAR DISC SURGERY  05/1996   L4-5; Dr. Alix ILES LAMINECTOMY/DECOMPRESSION MICRODISCECTOMY  10/2002   L3-4. Dr. Alix   MULTIPLE TOOTH EXTRACTIONS  1980s   PARTIAL GLOSSECTOMY  12/02/2017   Dr. Lauralee- [facility]   pharyngoplasty for closure of tingue base defect  12/02/2017   Dr. Lauralee- [facility]   PICC LINE INSERTION  07/15/2016   POSTERIOR LUMBAR FUSION  09/1996   Ray cage, L4-5 Dr. Mora   PROSTATE BIOPSY  ~ 2017   radical tonsillectomy Left 12/02/2017   Dr. [facility]alee at [facility]   RIGHT HEART CATH AND CORONARY ANGIOGRAPHY  N/A 03/31/2020   Procedure: RIGHT HEART CATH AND CORONARY ANGIOGRAPHY;  Surgeon: Rolan Ezra RAMAN, MD;  Location: [facility] INVASIVE CV LAB;  Service: Cardiovascular;  Laterality: N/A;   RIGID BRONCHOSCOPY Left 10/09/2017   Procedure: RIGID BRONCHOSCOPY;  Surgeon: Arlana Arnt, MD;  Location: [facility];  Service: ENT;  Laterality: Left;   TEE WITHOUT CARDIOVERSION N/A 06/07/2020   Procedure: TRANSESOPHAGEAL ECHOCARDIOGRAM (TEE);  Surgeon: Wonda Sharper, MD;  Location: [facility] OR;  Service: Open Heart Surgery;  Laterality: N/A;   TONSILLECTOMY     TOTAL HIP ARTHROPLASTY Left 12/04/2020   Procedure: TOTAL HIP ARTHROPLASTY ANTERIOR APPROACH;  Surgeon: Fidel Rogue, MD;  Location: [facility] ORS;  Service: Orthopedics;   Laterality: Left;   TOTAL HIP ARTHROPLASTY Left 11/2020   TRACHEOSTOMY  12/02/2017   Dr. [facility]alee- [facility]   ULTRASOUND GUIDANCE FOR VASCULAR ACCESS Right 06/07/2020   Procedure: ULTRASOUND GUIDANCE FOR VASCULAR ACCESS;  Surgeon: Wonda Sharper, MD;  Location: [facility] OR;  Service: Open Heart Surgery;  Laterality: Right;   ULTRASOUND GUIDANCE FOR VASCULAR ACCESS Bilateral 03/03/2021   Procedure: ULTRASOUND GUIDANCE FOR VASCULAR ACCESS, BILATERAL FEMORAL ARTERIES;  Surgeon: Sheree Penne Bruckner, MD;  Location: [facility] OR;  Service: Vascular;  Laterality: Bilateral;   VASCULAR SURGERY     Family History  Problem Relation Age of Onset   Heart disease Father 68       Living   Coronary artery disease Father        CABG   Alzheimer's disease Mother 50       Deceased   Arthritis Mother    Aneurysm Brother    Stomach cancer Maternal Uncle    Brain cancer Maternal Aunt        x2   Obesity Daughter        Had Bypass Sx   Outpatient Medications Prior to Visit  Medication Sig Dispense Refill   acetaminophen  (TYLENOL ) 500 MG tablet Take 1,000 mg by mouth every 8 (eight) hours as needed for mild pain or headache.     allopurinol  (ZYLOPRIM ) 100 MG tablet TAKE 2 TABLETS(200 MG) BY MOUTH DAILY 180 tablet 0   amLODipine  (NORVASC ) 10 MG tablet TAKE 1 TABLET(10 MG) BY MOUTH DAILY 90 tablet 0   aspirin  EC 81 MG tablet Take 81 mg by mouth at bedtime. Swallow whole.     atorvastatin  (LIPITOR ) 80 MG tablet TAKE 1 TABLET(80 MG) BY MOUTH DAILY 90 tablet 3   carvedilol  (COREG ) 12.5 MG tablet Take 12.5 mg by mouth 2 (two) times daily.     ezetimibe  (ZETIA ) 10 MG tablet TAKE 1 TABLET(10 MG) BY MOUTH DAILY. NEED FOLLOW UP APPOINTMENT FOR MORE REFILLS 30 tablet 0   ferrous sulfate  325 (65 FE) MG EC tablet Take 1 tablet (325 mg total) by mouth daily with breakfast. 30 tablet 0   fluticasone  (FLONASE ) 50 MCG/ACT nasal spray SHAKE LIQUID AND USE 2 SPRAYS IN EACH NOSTRIL DAILY AS NEEDED FOR RHINITIS OR ALLERGIES (Patient  taking differently: Place 2 sprays into both nostrils daily.) 16 g 11   gabapentin  (NEURONTIN ) 300 MG capsule Take 1 capsule (300 mg total) by mouth 2 (two) times daily. 60 capsule 2   guaiFENesin  (MUCINEX ) 600 MG 12 hr tablet Take 600 mg by mouth 2 (two) times daily as needed for cough or to loosen phlegm.     hydrOXYzine  (ATARAX ) 10 MG tablet Take 1-2 tablets (10-20 mg total) by mouth at bedtime as needed for itching (or sleep). 30 tablet 3   morphine  (  MS CONTIN ) 15 MG 12 hr tablet Take 1 tablet (15 mg total) by mouth every 12 (twelve) hours. 60 tablet 0   naloxone  (NARCAN ) nasal spray 4 mg/0.1 mL For possible accidentally overdose. 2 each 5   Oxycodone  HCl 10 MG TABS Take 1 tablet (10 mg total) by mouth every 4 (four) hours as needed. Don't fill before 02/20/22 150 tablet 0   pantoprazole  (PROTONIX ) 40 MG tablet Take 1 tablet (40 mg total) by mouth daily. (Patient taking differently: Take 40 mg by mouth daily as needed (for heartburn).) 30 tablet 2   potassium chloride  (KLOR-CON ) 10 MEQ tablet Take 10 mEq by mouth 4 (four) times daily.     predniSONE  (DELTASONE ) 5 MG tablet TAKE 1 TABLET(5 MG) BY MOUTH DAILY WITH BREAKFAST 90 tablet 0   tamsulosin  (FLOMAX ) 0.4 MG CAPS capsule Take 0.4 mg by mouth See admin instructions. Take 0.4 mg by mouth one to two times a day     traZODone  (DESYREL ) 50 MG tablet Take 0.5-1 tablets (25-50 mg total) by mouth at bedtime as needed for sleep. 30 tablet 3   triamcinolone  cream (KENALOG ) 0.1 % Apply 1 Application topically 2 (two) times daily as needed (to itchy sites).     trolamine salicylate (ASPERCREME) 10 % cream Apply 1 Application topically 2 (two) times daily as needed (arthritis pain).     albuterol  (VENTOLIN  HFA) 108 (90 Base) MCG/ACT inhaler INHALE 2 PUFFS INTO THE LUNGS EVERY 6 HOURS AS NEEDED FOR WHEEZING OR SHORTNESS OF BREATH 6.7 g 2   morphine  (MS CONTIN ) 15 MG 12 hr tablet Take 1 tablet (15 mg total) by mouth every 12 (twelve) hours. Can fill  12/24/22- for chronic pain 60 tablet 0   furosemide  (LASIX ) 20 MG tablet Take 1 tablet (20 mg total) by mouth daily. NEEDS FOLLOW UP APPOINTMENT FOR MORE REFILLS (Patient not taking: Reported on 02/11/2023) 90 tablet 0   ipratropium (ATROVENT ) 0.02 % nebulizer solution Use 1 vial (0.5 mg total) by nebulization every 6 (six) hours as needed for wheezing or shortness of breath. (Patient not taking: Reported on 02/11/2023) 75 mL 12   ipratropium (ATROVENT ) 0.03 % nasal spray Place 2 sprays into both nostrils every 12 (twelve) hours. (Patient not taking: Reported on 02/11/2023) 30 mL 12   levalbuterol  (XOPENEX ) 0.63 MG/3ML nebulizer solution Use 1 vial (0.63 mg total) by nebulization every 6 (six) hours as needed for wheezing or shortness of breath. (Patient not taking: Reported on 02/11/2023) 75 mL 12   Tiotropium Bromide  Monohydrate (SPIRIVA  RESPIMAT) 2.5 MCG/ACT AERS Inhale 2 puffs into the lungs daily. (Patient not taking: Reported on 02/11/2023) 4 g 6   amLODipine  (NORVASC ) 5 MG tablet Take 1 tablet (5 mg total) by mouth daily. (Patient not taking: Reported on 01/24/2023) 90 tablet 3   azithromycin  (ZITHROMAX ) 250 MG tablet Take 1 tablet (250 mg total) by mouth daily. Take first 2 tablets together, then 1 every day until finished. (Patient not taking: Reported on 01/24/2023) 6 tablet 0   carvedilol  (COREG ) 6.25 MG tablet Take 1 tablet (6.25 mg total) by mouth 2 (two) times daily with a meal. (Patient not taking: Reported on 01/24/2023) 60 tablet 3   Iron , Ferrous Sulfate , 325 (65 Fe) MG TABS Take 325 mg by mouth daily. (Patient not taking: Reported on 01/24/2023) 30 tablet 3   predniSONE  (DELTASONE ) 1 MG tablet Take 3 tablets (3 mg total) by mouth daily with breakfast. Take along with prednisone  5mg  tablet for a total of 8mg   daily. (Patient not taking: Reported on 01/24/2023) 180 tablet 0   No facility-administered medications prior to visit.   Allergies  Allergen Reactions   Tape Other (See Comments)     Tears skin. Prefers paper tape, Please   Celebrex [Celecoxib] Hives and Itching   Objective:   Today's Vitals   02/11/23 1040  BP: 134/62  Pulse: 80  Temp: 97.9 F (36.6 C)  TempSrc: Temporal  SpO2: 94%  Weight: 151 lb (68.5 kg)  Height: 5' 8 (1.727 m)   Body mass index is 22.96 kg/m.   General: Well developed, well nourished. No acute distress. Neck: Chronic scarring and post-operative/post-radiation changes. No palpable new growths. Extremities: Left shoulder with tenderness overt he glenohumeral joint line. Empty can test positive. Psych: Alert and oriented. Normal mood and affect.  Health Maintenance Due  Topic Date Due   Zoster Vaccines- Shingrix (1 of 2) Never done   Colonoscopy  03/22/2020   Lung Cancer Screening  08/18/2022   Lab Results    Latest Ref Rng & Units 01/24/2023    2:30 PM 08/22/2022    1:12 PM 06/26/2022    8:50 AM  CBC  WBC 3.8 - 10.8 Thousand/uL 7.0  8.9  6.4   Hemoglobin 13.2 - 17.1 g/dL 88.3  88.4  9.9   Hematocrit 38.5 - 50.0 % 36.6  37.8  32.0   Platelets 140 - 400 Thousand/uL 112  142  141.0       Latest Ref Rng & Units 01/24/2023    2:30 PM 08/22/2022    1:12 PM 06/26/2022    8:50 AM  CMP  Glucose 65 - 99 mg/dL 833  807  884   BUN 7 - 25 mg/dL 12  19  21    Creatinine 0.70 - 1.22 mg/dL 9.33  9.27  9.17   Sodium 135 - 146 mmol/L 139  140  138   Potassium 3.5 - 5.3 mmol/L 3.7  3.5  3.6   Chloride 98 - 110 mmol/L 101  102  101   CO2 20 - 32 mmol/L 31  31  28    Calcium  8.6 - 10.3 mg/dL 8.1  8.8  8.5   Total Protein 6.1 - 8.1 g/dL 5.5  5.9    6.0  5.5   Total Bilirubin 0.2 - 1.2 mg/dL 0.8  0.4  0.5   Alkaline Phos 39 - 117 U/L   127   AST 10 - 35 U/L 9  18  14    ALT 9 - 46 U/L 6  19  17     Lab Results  Component Value Date   ESRSEDRATE 29 (H) 01/24/2023     Assessment & Plan:   Problem List Items Addressed This Visit       Respiratory   Chronic obstructive pulmonary disease (HCC)   COPD is stable. I will renew his  albuterol . I advised him to quit smoking.      Relevant Medications   albuterol  (VENTOLIN  HFA) 108 (90 Base) MCG/ACT inhaler     Other   Anemia of chronic disease   Stable. I will reassess his iron  profile, B12, and folate to assure there are no other nutritional causes.      Relevant Orders   Iron , TIBC and Ferritin Panel   Vitamin B12   Folate   Hyperglycemia - Primary   Likely secondary to chronic prednisone  use. I will check an A1c today.      Relevant Orders   Hemoglobin A1c  Malnutrition of mild degree [facility])   Mr. Suppa's protein levels remain mildly low. I encouraged him about his dietary efforts. His weight is currently stable. His low albumin  makes his calcium  level appear low. However, his corrected calcium  is 8.66, which is within a normal range.      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HCC)   Stable. Currently on prednisone  5 mg daily. Continue to follow with rheumatology.      Weight loss   Mr. Mehlhoff weight loss has stabilized over the past 6 months.      Other Visit Diagnoses       Rotator cuff tendinitis, left       Exam consistent with rotator cuff tendinitis. I will refer him to orthopedics regarding this and his knee issues to consider a steroid injection.   Relevant Orders   Ambulatory referral to Orthopedics     Hoarseness       In light of prior tonisllar cancer and ongoing smoking, I recommend he follow-up with his surgeon, including looking at his vocal cords.     Marital dysfunction       Discussed approaches to reduce strife. I will start him on fluoxetine  20 mg daily. I will refer for counseling. He should return in 6 weeks.   Relevant Medications   FLUoxetine  (PROZAC ) 20 MG capsule   Other Relevant Orders   Ambulatory referral to Psychology     Primary osteoarthritis of both knees       I will refer him to orthopedics regarding this and his shoulder issues to consider a steroid injection.   Relevant Orders   Ambulatory referral to Orthopedics        Return in about 6 weeks (around 03/25/2023) for Reassessment.   Garnette CHRISTELLA Simpler, MD

## 2023-02-12 LAB — IRON,TIBC AND FERRITIN PANEL
%SAT: 13 % — ABNORMAL LOW (ref 20–48)
Ferritin: 271 ng/mL (ref 24–380)
Iron: 30 ug/dL — ABNORMAL LOW (ref 50–180)
TIBC: 226 ug/dL — ABNORMAL LOW (ref 250–425)

## 2023-02-14 ENCOUNTER — Ambulatory Visit: Payer: Medicare Other | Admitting: Physician Assistant

## 2023-02-20 ENCOUNTER — Telehealth: Payer: Self-pay

## 2023-02-20 MED ORDER — MORPHINE SULFATE ER 15 MG PO TBCR: 15.0000 mg | EXTENDED_RELEASE_TABLET | Freq: Two times a day (BID) | ORAL | 0 refills | Status: DC

## 2023-02-20 NOTE — Telephone Encounter (Signed)
 Patient requesting refill for oxycodone  and morphine   01/23/2023 01/08/2023 1  Oxycodone  Hcl (Ir) 10 Mg Tab 150.00 25 Eu Tho [id number] [facility] ([id number]) 0/0 90.00 MME Medicare [employer] 01/16/2023 01/16/2023 1  Morphine  Sulf Er 15 Mg Tablet 60.00 30 Me Lov [id number] [facility] ([id number]) 0/0 30.00 MME Medicare [place]

## 2023-02-20 NOTE — Telephone Encounter (Signed)
 PMP was Reviewed.  He has a Oxycodone  prescription at the pharmacy.  MS Contin  prescription e-scribed to pharmacy.  Call placed to [name] regarding the above, he verbalizes understanding.

## 2023-02-27 ENCOUNTER — Other Ambulatory Visit: Payer: Self-pay

## 2023-02-27 ENCOUNTER — Ambulatory Visit (INDEPENDENT_AMBULATORY_CARE_PROVIDER_SITE_OTHER): Payer: Medicare Other | Admitting: Orthopedic Surgery

## 2023-02-27 ENCOUNTER — Encounter: Payer: Self-pay | Admitting: Physician Assistant

## 2023-02-27 DIAGNOSIS — M25512 Pain in left shoulder: Secondary | ICD-10-CM

## 2023-02-27 DIAGNOSIS — G8929 Other chronic pain: Secondary | ICD-10-CM

## 2023-02-27 DIAGNOSIS — M19012 Primary osteoarthritis, left shoulder: Secondary | ICD-10-CM | POA: Insufficient documentation

## 2023-02-27 NOTE — Progress Notes (Signed)
Office Visit Note   Patient: Brandon [name]           Date of Birth: 1941/01/14           MRN: [id number] Visit Date: 02/27/2023              Requested by: Loyola Mast, MD [address] [name],  [place] [address] PCP: Loyola Mast, MD   Assessment & Plan: Visit Diagnoses: Osteoarthritis left shoulder  Plan: Patient is a pleasant 83 year old gentleman who comes in today complaining of left shoulder pain.  Denies any injury.  Has had problems with the left shoulder in the past.  In 2022 an MRI was done that showed degenerative arthritis both of the [facility] joint and the glenohumeral joint.  Also had some rotator cuff tearing with 1.4 cm of retraction.  His motion overhead and behind his back is actually fairly good today.  With external rotation he has pain deep in the shoulder joint.  Also has some impingement findings with empty can testing.  I think most of his symptoms today are coming from his glenohumeral arthritis.  Would benefit from an ultrasound-guided injection into the glenohumeral joint  Follow-Up Instructions: Return if symptoms worsen or fail to improve.   Orders:  Orders Placed This Encounter  Procedures   XR Shoulder Left   US Guided Needle Placement - No Linked Charges   No orders of the defined types were placed in this encounter.     Procedures: No procedures performed   Clinical Data: No additional findings.   Subjective: No chief complaint on file.   HPI patient is a pleasant 83 year old gentleman with a history of left cuff chronic shoulder pain.  He is followed by rheumatology for polymyalgia rheumatica.  He also is followed by chronic pain management.  He denies any recent injury but is having increasing shoulder pain that makes it difficult for him to sleep at night.  He has never had any injections.  He was taking a higher dose of prednisone daily which helped a little bit this is now been reduced to 5 mg a day.  He does not think it is  helping at all.  Denies any paresthesias or neck issues.  He says the pain feels deep in the joint.  Review of Systems  All other systems reviewed and are negative.    Objective: Vital Signs: There were no vitals taken for this visit.  Physical Exam Constitutional:      Appearance: Normal appearance.  Pulmonary:     Effort: Pulmonary effort is normal.  Skin:    General: Skin is warm and dry.  Neurological:     General: No focal deficit present.     Mental Status: He is alert and oriented to person, place, and time.     Ortho Exam Left shoulder he has no deformity no redness grip strength is intact.  He can forward elevate to 160 degrees negative drop arm test.  Internal rotation behind the back is only mildly painful.  He does have pain with empty can testing not really with speeds testing.  Tender to palpation more over the glenohumeral joint than the [facility] joint.  Strength is fair and appropriate given his age.  Strong distal pulses Specialty Comments:  No specialty comments available.  Imaging: No results found.   PMFS History: Patient Active Problem List   Diagnosis Date Noted   Primary osteoarthritis, left shoulder 02/27/2023   Anemia of chronic disease  02/11/2023   Hyperglycemia 02/11/2023   Vitamin B12 deficiency 02/11/2023   Age related osteoporosis 08/31/2022   Malnutrition of mild degree (HCC) 08/01/2022   Insomnia 06/26/2022   Arthritis of left hand 06/26/2022   Weight loss 06/26/2022   Chronic gout of multiple sites 04/30/2022   Acute exacerbation of chronic bronchitis (HCC) 04/03/2022   Chronically on opiate therapy 02/12/2022   Sacroiliac joint pain 02/12/2022   At risk for polypharmacy 01/19/2022   Aspiration pneumonia (HCC) 01/18/2022   Trochanteric bursitis of left hip 01/02/2022   Dysfunction of left eustachian tube 12/05/2021   Hypotension 08/18/2021   Hyponatremia 06/26/2021   Hyperkalemia 06/26/2021   Acute metabolic encephalopathy 06/26/2021    Myoclonic jerking 06/26/2021   Lumbar radiculopathy 06/08/2021   Lumbar postlaminectomy syndrome 05/03/2021   Degeneration of lumbar intervertebral disc 05/03/2021   Lumbar spondylosis 05/03/2021   History of hip fracture 04/24/2021   S/P abdominal aortic aneurysm repair 04/24/2021   Chronic allergic rhinitis 01/24/2021   Demand ischemia (HCC)    Acute respiratory failure with hypoxia (HCC)    Elevated troponin    Chronic diastolic heart failure (HCC) 12/03/2020   Steatosis of liver 11/01/2020   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   Hepatitis B core antibody positive 11/01/2020   Benign prostatic hyperplasia 11/01/2020   Chronic obstructive pulmonary disease (HCC) 07/20/2020   History of placement of stent in LAD coronary artery 07/20/2020   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   TIA (transient ischemic attack)    Esophageal stricture    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   Primary osteoarthritis of both hands 01/11/2020   Piriformis syndrome of both sides 09/23/2019   Pain of both shoulder joints 07/29/2019   Sciatica associated with disorder of lumbar spine 04/24/2019   Primary osteoarthritis involving multiple joints 04/24/2019   Chronic pain syndrome 04/24/2019   Mixed anxiety and depressive disorder 04/15/2018   Oropharyngeal dysphagia 03/11/2018   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   Tobacco abuse disorder 01/10/2014   Pain in left shoulder 01/10/2014   Essential hypertension 05/19/2013   Psoriasis 01/13/2009   Coronary artery disease 09/15/2008   Diverticulosis of colon 08/27/2007   History of benign prostatic hyperplasia 08/27/2007   Hyperlipidemia 03/19/2007   Gastroesophageal reflux disease 03/19/2007   Past Medical History:  Diagnosis Date   Abdominal aneurysm (HCC)    Arthritis    "all over" (07/19/2016)   BPH (benign prostatic hypertrophy)    CAD (coronary artery disease)    Chicken pox    Chronic lower  back pain    s/p surgical fusion   Depression    Diverticulosis    Esophageal stricture    GERD (gastroesophageal reflux disease)    Hepatitis B 1984   Hiatal hernia    History of radiation therapy 01/16/18- 03/05/18   Left Tonsil, 66 Gy in 33 fractions to high risk nodal echelons.    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 Excell Seltzer and Dr Laneta Simmers    Severe aortic stenosis    TIA (transient ischemic attack) 1990s   hx   tonsillar ca dx'd 11/2017   Tubular adenoma of colon 2009    Family History  Problem Relation Age of Onset   Heart disease Father 70  Living   Coronary artery disease Father        CABG   Alzheimer's disease Mother 63       Deceased   Arthritis Mother    Aneurysm Brother    Stomach cancer Maternal Uncle    Brain cancer Maternal Aunt        x2   Obesity Daughter        Had Bypass Sx    Past Surgical History:  Procedure Laterality Date   ABDOMINAL AORTIC ENDOVASCULAR STENT GRAFT N/A 03/03/2021   Procedure: ABDOMINAL AORTIC ENDOVASCULAR STENT GRAFT;  Surgeon: Maeola Harman, MD;  Location: [facility] OR;  Service: Vascular;  Laterality: N/A;   ABDOMINAL AORTOGRAM W/LOWER EXTREMITY N/A 04/10/2021   Procedure: ABDOMINAL AORTOGRAM W/LOWER EXTREMITY;  Surgeon: Maeola Harman, MD;  Location: [facility] INVASIVE CV LAB;  Service: Cardiovascular;  Laterality: N/A;   BACK SURGERY     CARDIAC CATHETERIZATION     CATARACT EXTRACTION W/ INTRAOCULAR LENS  IMPLANT, BILATERAL Bilateral 01/2012 - 02/2012   CORONARY ATHERECTOMY N/A 04/27/2020   Procedure: CORONARY ATHERECTOMY;  Surgeon: Tonny Bollman, MD;  Location: [facility] INVASIVE CV LAB;  Service: Cardiovascular;  Laterality: N/A;   CORONARY IMAGING/OCT N/A 04/27/2020   Procedure: INTRAVASCULAR IMAGING/OCT;  Surgeon: Tonny Bollman, MD;  Location: [facility] INVASIVE CV LAB;  Service: Cardiovascular;  Laterality: N/A;   CORONARY PRESSURE/FFR STUDY N/A 04/27/2020   Procedure: INTRAVASCULAR PRESSURE WIRE/FFR STUDY;  Surgeon: Tonny Bollman, MD;  Location: [facility] INVASIVE CV LAB;  Service: Cardiovascular;  Laterality: N/A;   CORONARY STENT INTERVENTION N/A 04/27/2020   Procedure: CORONARY STENT INTERVENTION;  Surgeon: Tonny Bollman, MD;  Location: [facility] INVASIVE CV LAB;  Service: Cardiovascular;  Laterality: N/A;   DIRECT LARYNGOSCOPY Left 10/09/2017   Procedure: DIRECT LARYNGOSCOPY WITH BOPSY;  Surgeon: Flo Shanks, MD;  Location: [facility];  Service: ENT;  Laterality: Left;   ESOPHAGOGASTRODUODENOSCOPY (EGD) WITH ESOPHAGEAL DILATION     "couple times" (07/19/2016)   ESOPHAGOSCOPY Left 10/09/2017   Procedure: ESOPHAGOSCOPY;  Surgeon: Flo Shanks, MD;  Location: [facility];  Service: ENT;  Laterality: Left;   IR GASTROSTOMY TUBE MOD SED  01/08/2018   IR THORACENTESIS ASP PLEURAL SPACE W/IMG GUIDE  07/19/2016   LAPAROSCOPIC CHOLECYSTECTOMY  1984   LUMBAR DISC SURGERY  05/1996   L4-5; Dr. Rosanne Sack LAMINECTOMY/DECOMPRESSION MICRODISCECTOMY  10/2002   L3-4. Dr. Newell Coral   MULTIPLE TOOTH EXTRACTIONS  1980s   PARTIAL GLOSSECTOMY  12/02/2017   Dr. Hezzie Bump- [facility]   pharyngoplasty for closure of tingue base defect  12/02/2017   Dr. Hezzie Bump- [facility]   PICC LINE INSERTION  07/15/2016   POSTERIOR LUMBAR FUSION  09/1996   Ray cage, L4-5 Dr. Jule Ser   PROSTATE BIOPSY  ~ 2017   radical tonsillectomy Left 12/02/2017   Dr. [facility]zzie Bump at [facility]   RIGHT HEART CATH AND CORONARY ANGIOGRAPHY N/A 03/31/2020   Procedure: RIGHT HEART CATH AND CORONARY ANGIOGRAPHY;  Surgeon: Laurey Morale, MD;  Location: [facility] INVASIVE CV LAB;  Service: Cardiovascular;  Laterality: N/A;   RIGID BRONCHOSCOPY Left 10/09/2017   Procedure: RIGID BRONCHOSCOPY;  Surgeon: Flo Shanks, MD;  Location: [facility];  Service: ENT;  Laterality: Left;   TEE WITHOUT CARDIOVERSION N/A 06/07/2020   Procedure: TRANSESOPHAGEAL ECHOCARDIOGRAM (TEE);  Surgeon: Tonny Bollman, MD;  Location: [facility] OR;  Service: Open Heart Surgery;  Laterality: N/A;   TONSILLECTOMY     TOTAL HIP ARTHROPLASTY Left 12/04/2020   Procedure: TOTAL  HIP ARTHROPLASTY ANTERIOR APPROACH;  Surgeon: Samson Frederic, MD;  Location: [facility] ORS;  Service: Orthopedics;  Laterality: Left;   TOTAL HIP ARTHROPLASTY Left 11/2020   TRACHEOSTOMY  12/02/2017   Dr. Hezzie Bump- [facility]   ULTRASOUND GUIDANCE FOR VASCULAR ACCESS Right 06/07/2020   Procedure: ULTRASOUND GUIDANCE FOR VASCULAR ACCESS;  Surgeon: Tonny Bollman, MD;  Location[facility] OR;  Service: Open Heart Surgery;  Laterality: Right;   ULTRASOUND GUIDANCE FOR VASCULAR ACCESS Bilateral 03/03/2021   Procedure: ULTRASOUND GUIDANCE FOR VASCULAR ACCESS, BILATERAL FEMORAL ARTERIES;  Surgeon: Maeola Harman, MD;  Location: [facility] OR;  Service: Vascular;  Laterality: Bilateral;   VASCULAR SURGERY     Social History   Occupational History   Occupation: retired    Associate Professor: RETIRED    Comment: disabled due to back problems  Tobacco Use   Smoking status: Former    Current packs/day: 0.00    Average packs/day: 1.5 packs/day for 50.0 years (75.0 ttl pk-yrs)    Types: Cigarettes    Start date: 09/06/1967    Quit date: 09/05/2017    Years since quitting: 5.4    Passive exposure: Never   Smokeless tobacco: Never   Tobacco comments:    smoked less than 1 ppd for 40+ years;   Vaping Use   Vaping status: Never Used  Substance and Sexual Activity   Alcohol use: Yes    Comment: rarely   Drug use: Yes    Types: Oxycodone, Morphine    Comment: has prescription   Sexual activity: Not Currently

## 2023-03-06 ENCOUNTER — Telehealth: Payer: Self-pay | Admitting: Rheumatology

## 2023-03-06 NOTE — Telephone Encounter (Signed)
Patient schedule for evaluation on 03/07/2023 at 3:20 pm.

## 2023-03-06 NOTE — Telephone Encounter (Signed)
Pt called in extreme pain in his left shoulder. He would like to talk to someone as soon as possible. Pt wanted to be seen as well. Pts appt is 03/12/23. Pt would like medication to help with this pain.

## 2023-03-07 ENCOUNTER — Ambulatory Visit: Payer: Medicare Other | Attending: Rheumatology | Admitting: Rheumatology

## 2023-03-07 ENCOUNTER — Encounter: Payer: Self-pay | Admitting: Rheumatology

## 2023-03-07 VITALS — BP 107/54 | HR 71 | Resp 14 | Ht 68.0 in | Wt 152.0 lb

## 2023-03-07 DIAGNOSIS — I10 Essential (primary) hypertension: Secondary | ICD-10-CM

## 2023-03-07 DIAGNOSIS — E559 Vitamin D deficiency, unspecified: Secondary | ICD-10-CM

## 2023-03-07 DIAGNOSIS — Z96642 Presence of left artificial hip joint: Secondary | ICD-10-CM | POA: Diagnosis not present

## 2023-03-07 DIAGNOSIS — Z952 Presence of prosthetic heart valve: Secondary | ICD-10-CM

## 2023-03-07 DIAGNOSIS — K750 Abscess of liver: Secondary | ICD-10-CM

## 2023-03-07 DIAGNOSIS — Z5181 Encounter for therapeutic drug level monitoring: Secondary | ICD-10-CM

## 2023-03-07 DIAGNOSIS — L409 Psoriasis, unspecified: Secondary | ICD-10-CM

## 2023-03-07 DIAGNOSIS — M810 Age-related osteoporosis without current pathological fracture: Secondary | ICD-10-CM

## 2023-03-07 DIAGNOSIS — Z8719 Personal history of other diseases of the digestive system: Secondary | ICD-10-CM

## 2023-03-07 DIAGNOSIS — F32A Depression, unspecified: Secondary | ICD-10-CM

## 2023-03-07 DIAGNOSIS — F419 Anxiety disorder, unspecified: Secondary | ICD-10-CM

## 2023-03-07 DIAGNOSIS — M353 Polymyalgia rheumatica: Secondary | ICD-10-CM

## 2023-03-07 DIAGNOSIS — R1312 Dysphagia, oropharyngeal phase: Secondary | ICD-10-CM

## 2023-03-07 DIAGNOSIS — Z9889 Other specified postprocedural states: Secondary | ICD-10-CM

## 2023-03-07 DIAGNOSIS — M1A0210 Idiopathic chronic gout, right elbow, without tophus (tophi): Secondary | ICD-10-CM

## 2023-03-07 DIAGNOSIS — M19041 Primary osteoarthritis, right hand: Secondary | ICD-10-CM | POA: Diagnosis not present

## 2023-03-07 DIAGNOSIS — Z862 Personal history of diseases of the blood and blood-forming organs and certain disorders involving the immune mechanism: Secondary | ICD-10-CM

## 2023-03-07 DIAGNOSIS — Z8639 Personal history of other endocrine, nutritional and metabolic disease: Secondary | ICD-10-CM

## 2023-03-07 DIAGNOSIS — M25512 Pain in left shoulder: Secondary | ICD-10-CM

## 2023-03-07 DIAGNOSIS — Z7952 Long term (current) use of systemic steroids: Secondary | ICD-10-CM | POA: Diagnosis not present

## 2023-03-07 DIAGNOSIS — M51369 Other intervertebral disc degeneration, lumbar region without mention of lumbar back pain or lower extremity pain: Secondary | ICD-10-CM

## 2023-03-07 DIAGNOSIS — Z87891 Personal history of nicotine dependence: Secondary | ICD-10-CM

## 2023-03-07 DIAGNOSIS — I7143 Infrarenal abdominal aortic aneurysm, without rupture: Secondary | ICD-10-CM

## 2023-03-07 DIAGNOSIS — G894 Chronic pain syndrome: Secondary | ICD-10-CM

## 2023-03-07 DIAGNOSIS — C090 Malignant neoplasm of tonsillar fossa: Secondary | ICD-10-CM

## 2023-03-07 DIAGNOSIS — M19042 Primary osteoarthritis, left hand: Secondary | ICD-10-CM

## 2023-03-07 MED ORDER — PREDNISONE 5 MG PO TABS: ORAL_TABLET | ORAL | 0 refills | Status: DC

## 2023-03-07 NOTE — Progress Notes (Signed)
Office Visit Note  Patient: Brandon [name]             Date of Birth: 11-Dec-1940           MRN: [id number]             PCP: Loyola Mast, MD Referring: Loyola Mast, MD Visit Date: 03/07/2023 Occupation: @GUAROCC @  Subjective:  Left shoulder pain   History of Present Illness: Brandon [name] is a 83 y.o. male with polymyalgia rheumatica and osteoarthritis.  He states about a week ago he started having severe pain and discomfort in his left shoulder.  He was having difficulty raising his arm.  He was seen at the orthopedic surgery.  He was given a cortisone injection in his left shoulder.  He states he had no relief from the cortisone injection so far.  He also increased his prednisone to 10 mg p.o. daily and noted some improvement but then he reduced it back to 5 mg p.o. daily.  He has been having some discomfort in his left hand.  Left total hip replacement is doing well.  Lower back pain is tolerable.  Denies any episodes of gout flare or pseudogout flare.  He had IV Reclast in August 2024.    Activities of Daily Living:  Patient reports morning stiffness for 1-2 hours.   Patient Denies nocturnal pain.  Difficulty dressing/grooming: Denies Difficulty climbing stairs: Denies Difficulty getting out of chair: Denies Difficulty using hands for taps, buttons, cutlery, and/or writing: Reports  Review of Systems  Constitutional:  Negative for fatigue.  HENT:  Positive for mouth dryness. Negative for mouth sores.   Eyes:  Positive for dryness.  Respiratory:  Negative for shortness of breath.   Cardiovascular:  Negative for chest pain and palpitations.  Gastrointestinal:  Negative for blood in stool, constipation and diarrhea.  Endocrine: Positive for increased urination.  Genitourinary:  Negative for involuntary urination.  Musculoskeletal:  Positive for joint pain, gait problem, joint pain, muscle weakness, morning stiffness and muscle tenderness. Negative for joint swelling,  myalgias and myalgias.  Skin:  Negative for color change, rash, hair loss and sensitivity to sunlight.  Allergic/Immunologic: Negative for susceptible to infections.  Neurological:  Negative for dizziness and headaches.  Hematological:  Negative for swollen glands.  Psychiatric/Behavioral:  Positive for depressed mood and sleep disturbance. The patient is not nervous/anxious.     PMFS History:  Patient Active Problem List   Diagnosis Date Noted   Primary osteoarthritis, left shoulder 02/27/2023   Anemia of chronic disease 02/11/2023   Hyperglycemia 02/11/2023   Vitamin B12 deficiency 02/11/2023   Age related osteoporosis 08/31/2022   Malnutrition of mild degree (HCC) 08/01/2022   Insomnia 06/26/2022   Arthritis of left hand 06/26/2022   Weight loss 06/26/2022   Chronic gout of multiple sites 04/30/2022   Acute exacerbation of chronic bronchitis (HCC) 04/03/2022   Chronically on opiate therapy 02/12/2022   Sacroiliac joint pain 02/12/2022   At risk for polypharmacy 01/19/2022   Aspiration pneumonia (HCC) 01/18/2022   Trochanteric bursitis of left hip 01/02/2022   Dysfunction of left eustachian tube 12/05/2021   Hypotension 08/18/2021   Hyponatremia 06/26/2021   Hyperkalemia 06/26/2021   Acute metabolic encephalopathy 06/26/2021   Myoclonic jerking 06/26/2021   Lumbar radiculopathy 06/08/2021   Lumbar postlaminectomy syndrome 05/03/2021   Degeneration of lumbar intervertebral disc 05/03/2021   Lumbar spondylosis 05/03/2021   History of hip fracture 04/24/2021   S/P abdominal aortic aneurysm repair 04/24/2021  Chronic allergic rhinitis 01/24/2021   Demand ischemia (HCC)    Acute respiratory failure with hypoxia (HCC)    Elevated troponin    Chronic diastolic heart failure (HCC) 12/03/2020   Steatosis of liver 11/01/2020   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   Hepatitis B core antibody positive 11/01/2020   Benign prostatic hyperplasia 11/01/2020   Chronic obstructive  pulmonary disease (HCC) 07/20/2020   History of placement of stent in LAD coronary artery 07/20/2020   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   TIA (transient ischemic attack)    Esophageal stricture    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   Primary osteoarthritis of both hands 01/11/2020   Piriformis syndrome of both sides 09/23/2019   Pain of both shoulder joints 07/29/2019   Sciatica associated with disorder of lumbar spine 04/24/2019   Primary osteoarthritis involving multiple joints 04/24/2019   Chronic pain syndrome 04/24/2019   Mixed anxiety and depressive disorder 04/15/2018   Oropharyngeal dysphagia 03/11/2018   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   Tobacco abuse disorder 01/10/2014   Pain in left shoulder 01/10/2014   Essential hypertension 05/19/2013   Psoriasis 01/13/2009   Coronary artery disease 09/15/2008   Diverticulosis of colon 08/27/2007   History of benign prostatic hyperplasia 08/27/2007   Hyperlipidemia 03/19/2007   Gastroesophageal reflux disease 03/19/2007    Past Medical History:  Diagnosis Date   Abdominal aneurysm (HCC)    Arthritis    "all over" (07/19/2016)   BPH (benign prostatic hypertrophy)    CAD (coronary artery disease)    Chicken pox    Chronic lower back pain    s/p surgical fusion   Depression    Diverticulosis    Esophageal stricture    GERD (gastroesophageal reflux disease)    Hepatitis B 1984   Hiatal hernia    History of radiation therapy 01/16/18- 03/05/18   Left Tonsil, 66 Gy in 33 fractions to high risk nodal echelons.    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 Excell Seltzer and Dr Laneta Simmers    Severe aortic stenosis    TIA (transient ischemic attack) 1990s   hx   tonsillar ca dx'd 11/2017   Tubular  adenoma of colon 2009    Family History  Problem Relation Age of Onset   Heart disease Father 60       Living   Coronary artery disease Father        CABG   Alzheimer's disease Mother 92       Deceased   Arthritis Mother    Aneurysm Brother    Stomach cancer Maternal Uncle    Brain cancer Maternal Aunt        x2   Obesity Daughter        Had Bypass Sx   Past Surgical History:  Procedure Laterality Date   ABDOMINAL AORTIC ENDOVASCULAR STENT GRAFT N/A 03/03/2021   Procedure: ABDOMINAL AORTIC ENDOVASCULAR STENT GRAFT;  Surgeon: Maeola Harman, MD;  Location: [facility];  Service: Vascular;  Laterality: N/A;   ABDOMINAL AORTOGRAM W/LOWER EXTREMITY N/A 04/10/2021   Procedure: ABDOMINAL AORTOGRAM W/LOWER EXTREMITY;  Surgeon: Maeola Harman, MD;  Location: [facility] INVASIVE CV LAB;  Service: Cardiovascular;  Laterality: N/A;   BACK SURGERY     CARDIAC CATHETERIZATION  CATARACT EXTRACTION W/ INTRAOCULAR LENS  IMPLANT, BILATERAL Bilateral 01/2012 - 02/2012   CORONARY ATHERECTOMY N/A 04/27/2020   Procedure: CORONARY ATHERECTOMY;  Surgeon: Tonny Bollman, MD;  Location: [facility] INVASIVE CV LAB;  Service: Cardiovascular;  Laterality: N/A;   CORONARY IMAGING/OCT N/A 04/27/2020   Procedure: INTRAVASCULAR IMAGING/OCT;  Surgeon: Tonny Bollman, MD;  Location: [facility] INVASIVE CV LAB;  Service: Cardiovascular;  Laterality: N/A;   CORONARY PRESSURE/FFR STUDY N/A 04/27/2020   Procedure: INTRAVASCULAR PRESSURE WIRE/FFR STUDY;  Surgeon: Tonny Bollman, MD;  Location: [facility] INVASIVE CV LAB;  Service: Cardiovascular;  Laterality: N/A;   CORONARY STENT INTERVENTION N/A 04/27/2020   Procedure: CORONARY STENT INTERVENTION;  Surgeon: Tonny Bollman, MD;  Location: [facility] INVASIVE CV LAB;  Service: Cardiovascular;  Laterality: N/A;   DIRECT LARYNGOSCOPY Left 10/09/2017   Procedure: DIRECT LARYNGOSCOPY WITH BOPSY;  Surgeon: Flo Shanks, MD;  Location: [facility];  Service: ENT;  Laterality:  Left;   ESOPHAGOGASTRODUODENOSCOPY (EGD) WITH ESOPHAGEAL DILATION     "couple times" (07/19/2016)   ESOPHAGOSCOPY Left 10/09/2017   Procedure: ESOPHAGOSCOPY;  Surgeon: Flo Shanks, MD;  Location: [facility];  Service: ENT;  Laterality: Left;   IR GASTROSTOMY TUBE MOD SED  01/08/2018   IR THORACENTESIS ASP PLEURAL SPACE W/IMG GUIDE  07/19/2016   LAPAROSCOPIC CHOLECYSTECTOMY  1984   LUMBAR DISC SURGERY  05/1996   L4-5; Dr. Rosanne Sack LAMINECTOMY/DECOMPRESSION MICRODISCECTOMY  10/2002   L3-4. Dr. Newell Coral   MULTIPLE TOOTH EXTRACTIONS  1980s   PARTIAL GLOSSECTOMY  12/02/2017   Dr. Hezzie Bump- [facility]   pharyngoplasty for closure of tingue base defect  12/02/2017   Dr. Hezzie Bump- [facility]   PICC LINE INSERTION  07/15/2016   POSTERIOR LUMBAR FUSION  09/1996   Ray cage, L4-5 Dr. Jule Ser   PROSTATE BIOPSY  ~ 2017   radical tonsillectomy Left 12/02/2017   Dr. [facility]zzie [facility]ump at [facility]   RIGHT HEART CATH AND CORONARY ANGIOGRAPHY N/A 03/31/2020   Procedure: RIGHT HEART CATH AND CORONARY ANGIOGRAPHY;  Surgeon: Laurey Morale, MD;  Location: [facility] INVASIVE CV LAB;  Service: Cardiovascular;  Laterality: N/A;   RIGID BRONCHOSCOPY Left 10/09/2017   Procedure: RIGID BRONCHOSCOPY;  Surgeon: Flo Shanks, MD;  Location: [facility];  Service: ENT;  Laterality: Left;   TEE WITHOUT CARDIOVERSION N/A 06/07/2020   Procedure: TRANSESOPHAGEAL ECHOCARDIOGRAM (TEE);  Surgeon: Tonny Bollman, MD;  Location: [facility] OR;  Service: Open Heart Surgery;  Laterality: N/A;   TONSILLECTOMY     TOTAL HIP ARTHROPLASTY Left 12/04/2020   Procedure: TOTAL HIP ARTHROPLASTY ANTERIOR APPROACH;  Surgeon: Samson Frederic, MD;  Location: [facility] ORS;  Service: Orthopedics;  Laterality: Left;   TOTAL HIP ARTHROPLASTY Left 11/2020   TRACHEOSTOMY  12/02/2017   Dr. Hezzie Bump- [facility]   ULTRASOUND GUIDANCE FOR VASCULAR ACCESS Right 06/07/2020   Procedure: ULTRASOUND GUIDANCE FOR VASCULAR ACCESS;  Surgeon: Tonny Bollman,  MD;  Location: [facility] OR;  Service: Open Heart Surgery;  Laterality: Right;   ULTRASOUND GUIDANCE FOR VASCULAR ACCESS Bilateral 03/03/2021   Procedure: ULTRASOUND GUIDANCE FOR VASCULAR ACCESS, BILATERAL FEMORAL ARTERIES;  Surgeon: Maeola Harman, MD;  Location: [facility] OR;  Service: Vascular;  Laterality: Bilateral;   VASCULAR SURGERY     Social History   Social History Narrative   ** Merged History Encounter **       Married (3rd), [place]. 2 children from 1st marriage, 4 step children.    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       Cell # [phone]   Immunization History  Administered Date(s) Administered   Fluad Quad(high Dose 65+) 10/15/2018, 11/06/2019, 10/20/2020   Influenza Split 11/26/2010, 10/11/2011, 11/03/2012   Influenza Whole 11/14/2006, 12/04/2007, 10/06/2008, 10/25/2009, 11/26/2021   Influenza, High Dose Seasonal PF 11/01/2016, 10/30/2017   Influenza, Seasonal, Injecte, Preservative Fre 12/07/2014   Influenza,inj,Quad PF,6+ Mos 11/04/2015   Influenza-Unspecified 11/19/2013, 11/06/2022   PFIZER(Purple Top)SARS-COV-2 Vaccination 02/25/2019, 03/18/2019, 11/16/2019   Pneumococcal Conjugate-13 12/07/2014   Pneumococcal Polysaccharide-23 05/30/2017   Tdap 01/28/2020     Objective: Vital Signs: BP (!) 107/54 (BP Location: Right Arm, Patient Position: Sitting, Cuff Size: Normal)   Pulse 71   Resp 14   Ht 5\' 8"  (1.727 m)   Wt 152 lb (68.9 kg)   BMI 23.11 kg/m    Physical Exam Vitals and nursing note reviewed.  Constitutional:      Appearance: He is well-developed.  HENT:     Head: Normocephalic and atraumatic.  Eyes:     Conjunctiva/sclera: Conjunctivae normal.     Pupils: Pupils are equal, round, and reactive to light.  Cardiovascular:     Rate and Rhythm: Normal rate and regular rhythm.     Heart sounds: Normal heart sounds.  Pulmonary:     Effort: Pulmonary effort is  normal.     Breath sounds: Normal breath sounds.  Abdominal:     General: Bowel sounds are normal.     Palpations: Abdomen is soft.  Musculoskeletal:     Cervical back: Normal range of motion and neck supple.  Skin:    General: Skin is warm and dry.     Capillary Refill: Capillary refill takes less than 2 seconds.  Neurological:     Mental Status: He is alert and oriented to person, place, and time.  Psychiatric:        Behavior: Behavior normal.      Musculoskeletal Exam: Patient had limited lateral rotation and flexion and extension of the cervical spine.  Thoracic kyphosis was noted.  He had limited range of motion of the lumbar spine.  Right shoulder joint was in good range of motion.  Left shoulder joint abduction and forward flexion was painful.  He had discomfort over left subacromial region.  Elbow joints and wrist joints in good range of motion.  He had bilateral PIP and DIP thickening with no synovitis.  He had limited range of motion of bilateral hip joints with the left hip replacement.  Knee joints in good range of motion.  There was no tenderness over ankles or MTPs.  There was no muscular weakness or tenderness.  CDAI Exam: CDAI Score: -- Patient Global: --; Provider Global: -- Swollen: --; Tender: -- Joint Exam 03/07/2023   No joint exam has been documented for this visit   There is currently no information documented on the homunculus. Go to the Rheumatology activity and complete the homunculus joint exam.  Investigation: No additional findings.  Imaging: No results found.  Recent Labs: Lab Results  Component Value Date   WBC 7.0 01/24/2023   HGB 11.6 (L) 01/24/2023   PLT 112 (L) 01/24/2023   NA 139 01/24/2023   K 3.7 01/24/2023   CL 101 01/24/2023   CO2 31 01/24/2023   GLUCOSE 166 (H) 01/24/2023   BUN 12 01/24/2023   CREATININE 0.66 (L) 01/24/2023   BILITOT 0.8 01/24/2023   ALKPHOS 127 (H) 06/26/2022   AST 9 (L) 01/24/2023  ALT 6 (L) 01/24/2023    PROT 5.5 (L) 01/24/2023   ALBUMIN 3.4 (L) 06/26/2022   CALCIUM 8.1 (L) 01/24/2023   GFRAA 107 08/11/2020   QFTBGOLDPLUS NEGATIVE 01/11/2020    Speciality Comments: Fosamax started on January 26, 2021.  If he has dysphagia with Fosamax or any GI intolerance we will have to switch him to Reclast.  Procedures:  No procedures performed Allergies: Tape and Celebrex [celecoxib]   Assessment / Plan:     Visit Diagnoses: Polymyalgia rheumatica (HCC) - History of muscle weakness and tenderness, elevated sed rate. Prednisone responsive: Polymyalgia rheumatica is well-controlled with prednisone 5 mg p.o. daily.  He is unable to taper prednisone as he likes it control pain.  He is not interested in DMARD therapy.  He states he ran out of prednisone is increase the dose to 10 mg p.o. daily for few days for the left shoulder pain.  He requested a refill on prednisone which was sent.  Long term (current) use of systemic steroids - Prednisone 5 mg daily. He is aware of the risks of long term prednisone use. Initiated IV reclast on 09/11/22.  Acute pain of left shoulder-patient started having pain and discomfort in his left shoulder about a week ago.  He was seen at Ortho care and had cortisone injection few days ago.  Patient reports no improvement so far.  Advised him to give it more time and then follow-up with the orthopedics if the symptoms persist.  Range of motion exercises were demonstrated.  Primary osteoarthritis of both hands-it bilateral PIP and DIP thickening with no synovitis.  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  He had no discomfort with range of motion.  Degeneration of intervertebral disc of lumbar region without discogenic back pain or lower extremity pain-he continues to have chronic discomfort.  He denies any radiculopathy today.  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  Synovial analysis 07/01/2020 revealed  extracellular monosodium urate crystals and intracellular calcium pyrophosphate crystals. uric acid: 4.4 on 01/24/2023  Age-related osteoporosis without current pathological fracture - DEXA updated on 07/27/2022: Right femoral neck BMD 0.558 with T-score -2.7.  -20% change in BMD for right total femur.  He was treated with Fosamax in the past.  He had last Reclast infusion on September 11, 2022.  Medication monitoring encounter - allopurinol 200 mg by mouth daily.  Vitamin D deficiency  Chronic pain syndrome - -Dr. Berline Chough  Psoriasis  Oropharyngeal dysphagia  Liver abscess  Aneurysm of infrarenal abdominal aorta, unspecified whether ruptured (HCC)  History of gastroesophageal reflux (GERD)  History of diverticulitis  History of hyperlipidemia  History of aortic valve replacement  Cancer of tonsillar fossa (HCC)  Essential hypertension  History of atherectomy  Anxiety and depression  History of iron deficiency anemia  Former smoker  Orders: No orders of the defined types were placed in this encounter.  Meds ordered this encounter  Medications   predniSONE (DELTASONE) 5 MG tablet    Sig: TAKE 1 TABLET(5 MG) BY MOUTH DAILY WITH BREAKFAST    Dispense:  90 tablet    Refill:  0     Follow-Up Instructions: Return in about 6 months (around 09/04/2023) for PMR, OA.   Pollyann Savoy, MD  Note - This record has been created using Animal nutritionist.  Chart creation errors have been sought, but may not always  have been located. Such creation errors do not reflect on  the standard  of medical care.

## 2023-03-08 ENCOUNTER — Other Ambulatory Visit (HOSPITAL_COMMUNITY): Payer: Self-pay | Admitting: Cardiology

## 2023-03-08 DIAGNOSIS — I5032 Chronic diastolic (congestive) heart failure: Secondary | ICD-10-CM

## 2023-03-11 ENCOUNTER — Ambulatory Visit: Payer: Self-pay | Admitting: Family Medicine

## 2023-03-11 NOTE — Telephone Encounter (Signed)
Patient scheduled 03/12/23.  Dm/cma

## 2023-03-11 NOTE — Telephone Encounter (Signed)
Copied from CRM [id number]. Topic: Clinical - Red Word Triage >> Mar 11, 2023  9:36 AM Gurney Maxin H wrote: [facility] that prompted transfer to Nurse Triage: Sever bladder infection, pain while urinating and  pain in private area and urine looks red.   Chief Complaint: Urinary symptoms Symptoms: bruning with urination and red-tinged urine Frequency: persistent, getting worse Pertinent Negatives: Patient denies fever Disposition: [] ED /[] Urgent Care (no appt availability in office) / [x] Appointment(In office/virtual)/ []  [facility]/ [] Home Care/ [] Refused Recommended Disposition /[]  Mobile Bus/ []  Follow-up with PCP Additional Notes: Patient reports symptoms started 1 week ago, now having red-tinged urine. Pt sts that he drinks a lot of water and urinated 6-9x a day. Sts that the pain is present only when urinating. Appt scheduled.   Reason for Disposition  All other males with painful urination  Answer Assessment - Initial Assessment Questions 1. SEVERITY: "How bad is the pain?"  (e.g., Scale 1-10; mild, moderate, or severe)   - MILD (1-3): Complains slightly about urination hurting.   - MODERATE (4-7): Interferes with normal activities.     - SEVERE (8-10): Excruciating, unwilling or unable to urinate because of the pain.      Moderate (around 5 or 6)  2. FREQUENCY: "How many times have you had painful urination today?"     Urinating 6-9x a days, sts that he drinks as much fluid as possible      3. PATTERN: "Is pain present every time you urinate or just sometimes?"      Everytime when urinating  4. ONSET: "When did the painful urination start?"      Started 1 week ago  5. FEVER: "Do you have a fever?" If Yes, ask: "What is your temperature, how was it measured, and when did it start?"     Unsure of fever, had some chills last week  6. PAST UTI: "Have you had a urine infection before?" If Yes, ask: "When was the last time?" and "What happened that time?"      Yes,  was treated with antibiotics  7. CAUSE: "What do you think is causing the painful urination?"      UTI  8. OTHER SYMPTOMS: "Do you have any other symptoms?" (e.g., flank pain, penis discharge, scrotal pain, blood in urine)    States that his urine is red-tinged  Protocols used: Urination Pain - Male-A-AH

## 2023-03-12 ENCOUNTER — Ambulatory Visit: Payer: Medicare Other | Admitting: Physician Assistant

## 2023-03-12 ENCOUNTER — Ambulatory Visit: Payer: Medicare Other | Admitting: Family Medicine

## 2023-03-12 ENCOUNTER — Encounter: Payer: Self-pay | Admitting: Family Medicine

## 2023-03-12 VITALS — BP 124/68 | HR 72 | Temp 97.0°F | Ht 68.0 in | Wt 146.0 lb

## 2023-03-12 DIAGNOSIS — J449 Chronic obstructive pulmonary disease, unspecified: Secondary | ICD-10-CM | POA: Diagnosis not present

## 2023-03-12 DIAGNOSIS — I5032 Chronic diastolic (congestive) heart failure: Secondary | ICD-10-CM | POA: Diagnosis not present

## 2023-03-12 DIAGNOSIS — I10 Essential (primary) hypertension: Secondary | ICD-10-CM | POA: Diagnosis not present

## 2023-03-12 DIAGNOSIS — M19012 Primary osteoarthritis, left shoulder: Secondary | ICD-10-CM

## 2023-03-12 DIAGNOSIS — N400 Enlarged prostate without lower urinary tract symptoms: Secondary | ICD-10-CM

## 2023-03-12 DIAGNOSIS — R300 Dysuria: Secondary | ICD-10-CM

## 2023-03-12 DIAGNOSIS — Z720 Tobacco use: Secondary | ICD-10-CM

## 2023-03-12 LAB — POCT URINALYSIS DIPSTICK
Bilirubin, UA: NEGATIVE
Blood, UA: NEGATIVE
Glucose, UA: NEGATIVE
Ketones, UA: NEGATIVE
Leukocytes, UA: NEGATIVE
Nitrite, UA: NEGATIVE
Protein, UA: POSITIVE — AB
Spec Grav, UA: 1.015 (ref 1.010–1.025)
Urobilinogen, UA: 1 U/dL
pH, UA: 6 (ref 5.0–8.0)

## 2023-03-12 MED ORDER — ALBUTEROL SULFATE HFA 108 (90 BASE) MCG/ACT IN AERS: 2.0000 | INHALATION_SPRAY | Freq: Four times a day (QID) | RESPIRATORY_TRACT | 2 refills | Status: DC | PRN

## 2023-03-12 MED ORDER — SPIRIVA RESPIMAT 2.5 MCG/ACT IN AERS: 2.0000 | INHALATION_SPRAY | Freq: Every day | RESPIRATORY_TRACT | 6 refills | Status: DC

## 2023-03-12 MED ORDER — TAMSULOSIN HCL 0.4 MG PO CAPS: 0.4000 mg | ORAL_CAPSULE | Freq: Every day | ORAL | 3 refills | Status: DC

## 2023-03-12 NOTE — Assessment & Plan Note (Signed)
His increased dyspnea may reflect Brandon [name] being off of his inhalers. I will renew his albuterol and tiotropium.

## 2023-03-12 NOTE — Assessment & Plan Note (Signed)
Patient maintains he is not smoking. he is due for his annual LDCT.

## 2023-03-12 NOTE — Assessment & Plan Note (Signed)
Following with orthopedics and rheumatology.

## 2023-03-12 NOTE — Assessment & Plan Note (Signed)
Blood pressure is in good control. Continue amlodipine 10 mg daily and carvedilol 12.5 mg twice daily.

## 2023-03-12 NOTE — Assessment & Plan Note (Signed)
Some of the recent symptoms sound like some bladder outlet obstruction likely secondary to his known BPH issues. I will start him back on tamsulosin.

## 2023-03-12 NOTE — Assessment & Plan Note (Signed)
Appears compensated, but having more dyspnea. Continue carvedilol 12.5 mg twice a day and furosemide 20 mg daily.

## 2023-03-12 NOTE — Progress Notes (Signed)
 [facility] [address] [place] [address] Dept: [phone] Dept Fax: [phone]  Office Visit  Subjective:    Patient ID: Brandon [name], male    DOB: 06-29-1940, 83 y.o..   MRN: [id number]  Chief Complaint  Patient presents with   Dysuria    C/o dysuria x 1 week  also having some SOB x 2-3 months   has been using his inhalers.     History of Present Illness:  Patient is in today complaining of recent episodes of dysuria. He notes that about a week ago, he was hit with a feeling of  chills across his abdomen. he applied a heating pad. Later he was able to urinate. He noted this looked darker than usual. He felt better after he was able to urinate. This recurred a few days later. He has a history of BPH and had previously been on tamsulosin , but this was stopped at one point.  Brandon [name] has a history of right shoulder arthritis. He received a steroid injection of the left shoulder about 2 weeks ago. He felt that the shoulder pain has not improved. He did see his rheumatologist recently who plans to follow up on this.  Brandon [name] has noted some dyspnea with exertion at times. He has a history of COPD and chronic diastolic heart failure. He has not been using the albuterol  and tiotropium inhalers he had been prescribed. He has also been more than a year since he saw Dr. Cherrie.  Past Medical History: Patient Active Problem List   Diagnosis Date Noted   Primary osteoarthritis, left shoulder 02/27/2023   Anemia of chronic disease 02/11/2023   Hyperglycemia 02/11/2023   Vitamin B12 deficiency 02/11/2023   Age related osteoporosis 08/31/2022   Malnutrition of mild degree (HCC) 08/01/2022   Insomnia 06/26/2022   Arthritis of left hand 06/26/2022   Weight loss 06/26/2022   Chronic gout of multiple sites 04/30/2022   Chronically on opiate therapy 02/12/2022   Sacroiliac joint pain 02/12/2022   At risk for polypharmacy 01/19/2022    Trochanteric bursitis of left hip 01/02/2022   Dysfunction of left eustachian tube 12/05/2021   Hypotension 08/18/2021   Hyponatremia 06/26/2021   Hyperkalemia 06/26/2021   Acute metabolic encephalopathy 06/26/2021   Myoclonic jerking 06/26/2021   Lumbar radiculopathy 06/08/2021   Lumbar postlaminectomy syndrome 05/03/2021   Degeneration of lumbar intervertebral disc 05/03/2021   Lumbar spondylosis 05/03/2021   History of hip fracture 04/24/2021   S/P abdominal aortic aneurysm repair 04/24/2021   Chronic allergic rhinitis 01/24/2021   Demand ischemia (HCC)    Elevated troponin    Chronic diastolic heart failure (HCC) 12/03/2020   Steatosis of liver 11/01/2020   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   Hepatitis B core antibody positive 11/01/2020   Benign prostatic hyperplasia 11/01/2020   Chronic obstructive pulmonary disease (HCC) 07/20/2020   History of placement of stent in LAD coronary artery 07/20/2020   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   TIA (transient ischemic attack)    Esophageal stricture    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   Primary osteoarthritis of both hands 01/11/2020   Piriformis syndrome of both sides 09/23/2019   Sciatica associated with disorder of lumbar spine 04/24/2019   Primary osteoarthritis involving multiple joints 04/24/2019   Chronic pain syndrome 04/24/2019   Mixed anxiety and depressive disorder 04/15/2018   Oropharyngeal dysphagia 03/11/2018   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   Tobacco abuse  disorder 01/10/2014   Essential hypertension 05/19/2013   Psoriasis 01/13/2009   Coronary artery disease 09/15/2008   Diverticulosis of colon 08/27/2007   History of benign prostatic hyperplasia 08/27/2007   Hyperlipidemia 03/19/2007   Gastroesophageal reflux disease 03/19/2007   Past Surgical History:  Procedure Laterality Date   ABDOMINAL AORTIC ENDOVASCULAR STENT GRAFT N/A 03/03/2021    Procedure: ABDOMINAL AORTIC ENDOVASCULAR STENT GRAFT;  Surgeon: Sheree Penne Bruckner, MD;  Location: [facility] OR;  Service: Vascular;  Laterality: N/A;   ABDOMINAL AORTOGRAM W/LOWER EXTREMITY N/A 04/10/2021   Procedure: ABDOMINAL AORTOGRAM W/LOWER EXTREMITY;  Surgeon: Sheree Penne Bruckner, MD;  Location: [facility] INVASIVE CV LAB;  Service: Cardiovascular;  Laterality: N/A;   BACK SURGERY     CARDIAC CATHETERIZATION     CATARACT EXTRACTION W/ INTRAOCULAR LENS  IMPLANT, BILATERAL Bilateral 01/2012 - 02/2012   CORONARY ATHERECTOMY N/A 04/27/2020   Procedure: CORONARY ATHERECTOMY;  Surgeon: Wonda Sharper, MD;  Location: [facility] INVASIVE CV LAB;  Service: Cardiovascular;  Laterality: N/A;   CORONARY IMAGING/OCT N/A 04/27/2020   Procedure: INTRAVASCULAR IMAGING/OCT;  Surgeon: Wonda Sharper, MD;  Location: [facility] INVASIVE CV LAB;  Service: Cardiovascular;  Laterality: N/A;   CORONARY PRESSURE/FFR STUDY N/A 04/27/2020   Procedure: INTRAVASCULAR PRESSURE WIRE/FFR STUDY;  Surgeon: Wonda Sharper, MD;  Location: [facility] INVASIVE CV LAB;  Service: Cardiovascular;  Laterality: N/A;   CORONARY STENT INTERVENTION N/A 04/27/2020   Procedure: CORONARY STENT INTERVENTION;  Surgeon: Wonda Sharper, MD;  Location: [facility] INVASIVE CV LAB;  Service: Cardiovascular;  Laterality: N/A;   DIRECT LARYNGOSCOPY Left 10/09/2017   Procedure: DIRECT LARYNGOSCOPY WITH BOPSY;  Surgeon: Arlana Arnt, MD;  Location: [facility];  Service: ENT;  Laterality: Left;   ESOPHAGOGASTRODUODENOSCOPY (EGD) WITH ESOPHAGEAL DILATION     couple times (07/19/2016)   ESOPHAGOSCOPY Left 10/09/2017   Procedure: ESOPHAGOSCOPY;  Surgeon: Arlana Arnt, MD;  Location: [facility];  Service: ENT;  Laterality: Left;   IR GASTROSTOMY TUBE MOD SED  01/08/2018   IR THORACENTESIS ASP PLEURAL SPACE W/IMG GUIDE  07/19/2016   LAPAROSCOPIC CHOLECYSTECTOMY  1984   LUMBAR DISC SURGERY  05/1996   L4-5; Dr. Alix ILES  LAMINECTOMY/DECOMPRESSION MICRODISCECTOMY  10/2002   L3-4. Dr. Alix   MULTIPLE TOOTH EXTRACTIONS  1980s   PARTIAL GLOSSECTOMY  12/02/2017   Dr. Lauralee- [facility]   pharyngoplasty for closure of tingue base defect  12/02/2017   Dr. Lauralee- [facility]   PICC LINE INSERTION  07/15/2016   POSTERIOR LUMBAR FUSION  09/1996   Ray cage, L4-5 Dr. Mora   PROSTATE BIOPSY  ~ 2017   radical tonsillectomy Left 12/02/2017   Dr. Lauralee at [facility]   RIGHT HEART CATH AND CORONARY ANGIOGRAPHY N/A 03/31/2020   Procedure: RIGHT HEART CATH AND CORONARY ANGIOGRAPHY;  Surgeon: Rolan Ezra RAMAN, MD;  Location: [facility] INVASIVE CV LAB;  Service: Cardiovascular;  Laterality: N/A;   RIGID BRONCHOSCOPY Left 10/09/2017   Procedure: RIGID BRONCHOSCOPY;  Surgeon: Arlana Arnt, MD;  Location: [facility];  Service: ENT;  Laterality: Left;   TEE WITHOUT CARDIOVERSION N/A 06/07/2020   Procedure: TRANSESOPHAGEAL ECHOCARDIOGRAM (TEE);  Surgeon: Wonda Sharper, MD;  Location: [facility] OR;  Service: Open Heart Surgery;  Laterality: N/A;   TONSILLECTOMY     TOTAL HIP ARTHROPLASTY Left 12/04/2020   Procedure: TOTAL HIP ARTHROPLASTY ANTERIOR APPROACH;  Surgeon: Fidel Rogue, MD;  Location: [facility] ORS;  Service: Orthopedics;  Laterality: Left;   TOTAL HIP ARTHROPLASTY Left 11/2020   TRACHEOSTOMY  12/02/2017  Dr. Lauralee- [facility]   ULTRASOUND GUIDANCE FOR VASCULAR ACCESS Right 06/07/2020   Procedure: ULTRASOUND GUIDANCE FOR VASCULAR ACCESS;  Surgeon: Wonda Sharper, MD;  Location: [facility] OR;  Service: Open Heart Surgery;  Laterality: Right;   ULTRASOUND GUIDANCE FOR VASCULAR ACCESS Bilateral 03/03/2021   Procedure: ULTRASOUND GUIDANCE FOR VASCULAR ACCESS, BILATERAL FEMORAL ARTERIES;  Surgeon: Sheree Penne Bruckner, MD;  Location: [facility] OR;  Service: Vascular;  Laterality: Bilateral;   VASCULAR SURGERY     Family History  Problem Relation Age of Onset   Heart disease Father 7       Living   Coronary artery disease Father         CABG   Alzheimer's disease Mother 34       Deceased   Arthritis Mother    Aneurysm Brother    Stomach cancer Maternal Uncle    Brain cancer Maternal Aunt        x2   Obesity Daughter        Had Bypass Sx   Outpatient Medications Prior to Visit  Medication Sig Dispense Refill   acetaminophen  (TYLENOL ) 500 MG tablet Take 1,000 mg by mouth every 8 (eight) hours as needed for mild pain or headache.     allopurinol  (ZYLOPRIM ) 100 MG tablet TAKE 2 TABLETS(200 MG) BY MOUTH DAILY 180 tablet 0   amLODipine  (NORVASC ) 10 MG tablet TAKE 1 TABLET(10 MG) BY MOUTH DAILY 90 tablet 0   aspirin  EC 81 MG tablet Take 81 mg by mouth at bedtime. Swallow whole.     atorvastatin  (LIPITOR ) 80 MG tablet TAKE 1 TABLET(80 MG) BY MOUTH DAILY 90 tablet 3   carvedilol  (COREG ) 12.5 MG tablet Take 12.5 mg by mouth 2 (two) times daily.     cyanocobalamin  (VITAMIN B12) 1000 MCG tablet Take 1 tablet (1,000 mcg total) by mouth daily. 90 tablet 3   ezetimibe  (ZETIA ) 10 MG tablet TAKE 1 TABLET(10 MG) BY MOUTH DAILY. NEED FOLLOW UP APPOINTMENT FOR MORE REFILLS 30 tablet 0   feeding supplement (BOOST HIGH PROTEIN) LIQD Take 1 Container by mouth 3 (three) times daily between meals.     FLUoxetine  (PROZAC ) 20 MG capsule Take 1 capsule (20 mg total) by mouth daily. 90 capsule 3   fluticasone  (FLONASE ) 50 MCG/ACT nasal spray SHAKE LIQUID AND USE 2 SPRAYS IN EACH NOSTRIL DAILY AS NEEDED FOR RHINITIS OR ALLERGIES (Patient taking differently: Place 2 sprays into both nostrils daily.) 16 g 11   gabapentin  (NEURONTIN ) 300 MG capsule Take 1 capsule (300 mg total) by mouth 2 (two) times daily. 60 capsule 2   guaiFENesin  (MUCINEX ) 600 MG 12 hr tablet Take 600 mg by mouth 2 (two) times daily as needed for cough or to loosen phlegm.     hydrOXYzine  (ATARAX ) 10 MG tablet Take 1-2 tablets (10-20 mg total) by mouth at bedtime as needed for itching (or sleep). 30 tablet 3   ipratropium (ATROVENT ) 0.02 % nebulizer solution Use 1 vial (0.5 mg  total) by nebulization every 6 (six) hours as needed for wheezing or shortness of breath. 75 mL 12   morphine  (MS CONTIN ) 15 MG 12 hr tablet Take 1 tablet (15 mg total) by mouth every 12 (twelve) hours. 60 tablet 0   naloxone  (NARCAN ) nasal spray 4 mg/0.1 mL For possible accidentally overdose. 2 each 5   Oxycodone  HCl 10 MG TABS Take 1 tablet (10 mg total) by mouth every 4 (four) hours as needed. Don't fill before 02/20/22 150 tablet 0   potassium  chloride (KLOR-CON ) 10 MEQ tablet Take 10 mEq by mouth 4 (four) times daily.     predniSONE  (DELTASONE ) 5 MG tablet TAKE 1 TABLET(5 MG) BY MOUTH DAILY WITH BREAKFAST 90 tablet 0   tamsulosin  (FLOMAX ) 0.4 MG CAPS capsule Take 0.4 mg by mouth See admin instructions. Take 0.4 mg by mouth one to two times a day     traZODone  (DESYREL ) 50 MG tablet Take 0.5-1 tablets (25-50 mg total) by mouth at bedtime as needed for sleep. 30 tablet 3   triamcinolone  cream (KENALOG ) 0.1 % Apply 1 Application topically 2 (two) times daily as needed (to itchy sites).     trolamine salicylate (ASPERCREME) 10 % cream Apply 1 Application topically 2 (two) times daily as needed (arthritis pain).     albuterol  (VENTOLIN  HFA) 108 (90 Base) MCG/ACT inhaler Inhale 2 puffs into the lungs every 6 (six) hours as needed for wheezing or shortness of breath. 6.7 g 2   Tiotropium Bromide  Monohydrate (SPIRIVA  RESPIMAT) 2.5 MCG/ACT AERS Inhale 2 puffs into the lungs daily. 4 g 6   ferrous sulfate  325 (65 FE) MG EC tablet Take 1 tablet (325 mg total) by mouth daily with breakfast. 30 tablet 0   pantoprazole  (PROTONIX ) 40 MG tablet Take 1 tablet (40 mg total) by mouth daily. (Patient taking differently: Take 40 mg by mouth daily as needed (for heartburn).) 30 tablet 2   No facility-administered medications prior to visit.   Allergies  Allergen Reactions   Tape Other (See Comments)    Tears skin. Prefers paper tape, Please   Celebrex [Celecoxib] Hives and Itching     Objective:   Today's  Vitals   03/12/23 1556  BP: 124/68  Pulse: 72  Temp: (!) 97 F (36.1 C)  TempSrc: Temporal  SpO2: 97%  Weight: 146 lb (66.2 kg)  Height: 5' 8 (1.727 m)   Body mass index is 22.2 kg/m.   General: Well developed, well nourished. No acute distress. Abdomen: Soft, non-tender. No rebound or guarding. Psych: Alert and oriented. Normal mood and affect.  Health Maintenance Due  Topic Date Due   Zoster Vaccines- Shingrix (1 of 2) Never done   Colonoscopy  03/22/2020   Lung Cancer Screening  08/18/2022   COVID-19 Vaccine (4 - 2024-25 season) 10/07/2022     Lab Results:    Component Ref Range & Units (hover) 16:01 (03/12/23)  Color, UA dark yellow  Clarity, UA clear  Glucose, UA Negative  Bilirubin, UA neg  Ketones, UA neg  Spec Grav, UA 1.015  Blood, UA neg  pH, UA 6.0  Protein, UA Positive Abnormal   Comment: 15 mg/dl  Urobilinogen, UA 1.0  Nitrite, UA neg  Leukocytes, UA Negative   Assessment & Plan:   Problem List Items Addressed This Visit       Cardiovascular and Mediastinum   Chronic diastolic heart failure (HCC)   Appears compensated, but having more dyspnea. Continue carvedilol  12.5 mg twice a day and furosemide  20 mg daily.      Relevant Orders   Ambulatory referral to Cardiology   Essential hypertension   Blood pressure is in good control. Continue amlodipine  10 mg daily and carvedilol  12.5 mg twice daily.        Respiratory   Chronic obstructive pulmonary disease (HCC)   His increased dyspnea may reflect Mr. Hagger being off of his inhalers. I will renew his albuterol  and tiotropium.      Relevant Medications   albuterol  (VENTOLIN  HFA) 108 (90  Base) MCG/ACT inhaler   Tiotropium Bromide  Monohydrate (SPIRIVA  RESPIMAT) 2.5 MCG/ACT AERS     Musculoskeletal and Integument   Primary osteoarthritis, left shoulder   Following with orthopedics and rheumatology.        Genitourinary   Benign prostatic hyperplasia   Some of the recent symptoms sound  like some bladder outlet obstruction likely secondary to his known BPH issues. I will start him back on tamsulosin .      Relevant Medications   tamsulosin  (FLOMAX ) 0.4 MG CAPS capsule     Other   Tobacco abuse disorder   Patient maintains he is not smoking. he is due for his annual LDCT.      Relevant Orders   CT CHEST LUNG CANCER SCREENING LOW DOSE WO CONTRAST   Other Visit Diagnoses       Dysuria    -  Primary   The UA appears normal. There is no sign of infection. Mr. Palmero feels he may not be drinking enough, so plans to focus on this.   Relevant Orders   POCT Urinalysis Dipstick (Completed)       Return in about 6 weeks (around 04/23/2023) for Reassessment.   Garnette CHRISTELLA Simpler, MD

## 2023-03-13 ENCOUNTER — Encounter: Payer: Self-pay | Admitting: Family Medicine

## 2023-03-13 NOTE — Addendum Note (Signed)
 Addended by: Graig Lawyer on: 03/13/2023 08:55 AM   Modules accepted: Orders

## 2023-03-16 ENCOUNTER — Emergency Department (HOSPITAL_COMMUNITY)
Admission: EM | Admit: 2023-03-16 | Discharge: 2023-03-16 | Disposition: A | Discharge status: Home / Self Care | Payer: Medicare Other | Attending: Emergency Medicine | Admitting: Emergency Medicine

## 2023-03-16 ENCOUNTER — Other Ambulatory Visit: Payer: Self-pay

## 2023-03-16 ENCOUNTER — Encounter (HOSPITAL_COMMUNITY): Payer: Self-pay

## 2023-03-16 DIAGNOSIS — J449 Chronic obstructive pulmonary disease, unspecified: Secondary | ICD-10-CM | POA: Insufficient documentation

## 2023-03-16 DIAGNOSIS — H1132 Conjunctival hemorrhage, left eye: Secondary | ICD-10-CM | POA: Diagnosis not present

## 2023-03-16 DIAGNOSIS — Z7951 Long term (current) use of inhaled steroids: Secondary | ICD-10-CM | POA: Insufficient documentation

## 2023-03-16 DIAGNOSIS — I10 Essential (primary) hypertension: Secondary | ICD-10-CM | POA: Diagnosis not present

## 2023-03-16 DIAGNOSIS — I2510 Atherosclerotic heart disease of native coronary artery without angina pectoris: Secondary | ICD-10-CM | POA: Diagnosis not present

## 2023-03-16 DIAGNOSIS — Z7982 Long term (current) use of aspirin: Secondary | ICD-10-CM | POA: Diagnosis not present

## 2023-03-16 DIAGNOSIS — Z79899 Other long term (current) drug therapy: Secondary | ICD-10-CM | POA: Insufficient documentation

## 2023-03-16 MED ORDER — TETRACAINE HCL 0.5 % OP SOLN
1.0000 [drp] | Freq: Once | OPHTHALMIC | Status: DC
  Filled 2023-03-16: qty 4

## 2023-03-16 MED ORDER — DORZOLAMIDE HCL-TIMOLOL MAL 2-0.5 % OP SOLN: 1.0000 [drp] | Freq: Two times a day (BID) | OPHTHALMIC | 12 refills | Status: DC

## 2023-03-16 NOTE — Discharge Instructions (Addendum)
 It was a pleasure taking part in your care.  Please begin taking Cosopt  eyedrops into the left eye twice a day.  Please follow-up with Dr. Maree on Monday and arrive to his office between 8AM and 12PM. Please call his office Monday morning and advise them of your impending arrival.  Return to the ED with any new or worsening symptoms.  Read attached guide concerning subconjunctival hemorrhage.

## 2023-03-16 NOTE — ED Notes (Signed)
 Pt discharged by provider and left before receiving d/c assessment and vitals.

## 2023-03-16 NOTE — ED Triage Notes (Signed)
 Patient reports was rubbing his eye this morning and now the entire sclera is blood.  Patient reports not vision changes and takes ASA

## 2023-03-16 NOTE — ED Provider Notes (Addendum)
 [place] [facility] Provider Note   CSN: [id number] Arrival date & time: 03/16/23  0935     History  Chief Complaint  Patient presents with   Eye Problem    Brandon [name] is a 83 y.o. male with medical history of osteoarthritis, hypertension, HLD, GERD, diverticulosis, chronic low back pain, CAD, BPH, COPD.  Patient presents to ED for evaluation of bleeding into his left eye.  States that this morning he woke up in his usual state of health.  States that he typically has crossed eyes in the morning.  States he typically will rub his eyes and this will help alleviate his crossed eyes.  States this morning he rubbed his eyes and then began having bleeding into his left eye.  Denies any decreased visual acuity, denies blurred vision.  States that sometimes when he looks to his left he will get double vision but reports it is very subtle and not consistent.  Denies any pain in his eye, nausea, vomiting.  Denies blood thinning medication.   Eye Problem Associated symptoms: redness   Associated symptoms: no discharge, no itching and no photophobia        Home Medications Prior to Admission medications   Medication Sig Start Date End Date Taking? Authorizing Provider  acetaminophen  (TYLENOL ) 500 MG tablet Take 1,000 mg by mouth every 8 (eight) hours as needed for mild pain or headache.    [provider]  albuterol  (VENTOLIN  HFA) 108 (90 Base) MCG/ACT inhaler Inhale 2 puffs into the lungs every 6 (six) hours as needed for wheezing or shortness of breath. 03/12/23   Thedora Garnette HERO, MD  allopurinol  (ZYLOPRIM ) 100 MG tablet TAKE 2 TABLETS(200 MG) BY MOUTH DAILY 01/21/23   Cheryl Waddell HERO, PA-C  amLODipine  (NORVASC ) 10 MG tablet TAKE 1 TABLET(10 MG) BY MOUTH DAILY 09/24/22   Rolan Ezra RAMAN, MD  aspirin  EC 81 MG tablet Take 81 mg by mouth at bedtime. Swallow whole.    [provider]  atorvastatin  (LIPITOR ) 80 MG tablet TAKE 1 TABLET(80 MG)  BY MOUTH DAILY 03/12/22   Rolan Ezra RAMAN, MD  carvedilol  (COREG ) 12.5 MG tablet Take 12.5 mg by mouth 2 (two) times daily. 09/24/22   [provider]  cyanocobalamin  (VITAMIN B12) 1000 MCG tablet Take 1 tablet (1,000 mcg total) by mouth daily. 02/11/23   Thedora Garnette HERO, MD  dorzolamide -timolol  (COSOPT ) 2-0.5 % ophthalmic solution Place 1 drop into the left eye 2 (two) times daily. 03/16/23  Yes Ruthell Lonni FALCON, PA-C  ezetimibe  (ZETIA ) 10 MG tablet TAKE 1 TABLET(10 MG) BY MOUTH DAILY. NEED FOLLOW UP APPOINTMENT FOR MORE REFILLS 03/08/23   Rolan Ezra RAMAN, MD  feeding supplement (BOOST HIGH PROTEIN) LIQD Take 1 Container by mouth 3 (three) times daily between meals.    [provider]  ferrous sulfate  325 (65 FE) MG EC tablet Take 1 tablet (325 mg total) by mouth daily with breakfast. 01/20/22 02/11/23  Cindy Garnette POUR, MD  FLUoxetine  (PROZAC ) 20 MG capsule Take 1 capsule (20 mg total) by mouth daily. 02/11/23   Thedora Garnette HERO, MD  fluticasone  (FLONASE ) 50 MCG/ACT nasal spray SHAKE LIQUID AND USE 2 SPRAYS IN EACH NOSTRIL DAILY AS NEEDED FOR RHINITIS OR ALLERGIES Patient taking differently: Place 2 sprays into both nostrils daily. 11/14/20   Thedora Garnette HERO, MD  gabapentin  (NEURONTIN ) 300 MG capsule Take 1 capsule (300 mg total) by mouth 2 (two) times daily. 01/09/23   Debby Genre  L, NP  guaiFENesin  (MUCINEX ) 600 MG 12 hr tablet Take 600 mg by mouth 2 (two) times daily as needed for cough or to loosen phlegm.    [provider]  hydrOXYzine  (ATARAX ) 10 MG tablet Take 1-2 tablets (10-20 mg total) by mouth at bedtime as needed for itching (or sleep). 01/09/23   Thedora Garnette HERO, MD  ipratropium (ATROVENT ) 0.02 % nebulizer solution Use 1 vial (0.5 mg total) by nebulization every 6 (six) hours as needed for wheezing or shortness of breath. 08/21/21   Sherrill Cable Latif, DO  morphine  (MS CONTIN ) 15 MG 12 hr tablet Take 1 tablet (15 mg total) by mouth every 12 (twelve) hours. 02/20/23    Debby Fidela CROME, NP  naloxone  (NARCAN ) nasal spray 4 mg/0.1 mL For possible accidentally overdose. 02/12/22   Lovorn, Megan, MD  Oxycodone  HCl 10 MG TABS Take 1 tablet (10 mg total) by mouth every 4 (four) hours as needed. Don't fill before 02/20/22 01/08/23   Debby Fidela CROME, NP  pantoprazole  (PROTONIX ) 40 MG tablet Take 1 tablet (40 mg total) by mouth daily. Patient taking differently: Take 40 mg by mouth daily as needed (for heartburn). 06/30/21 02/11/23  [place], Camellia PARAS, DO  potassium chloride  (KLOR-CON ) 10 MEQ tablet Take 10 mEq by mouth 4 (four) times daily. 03/08/22   [provider]  predniSONE  (DELTASONE ) 5 MG tablet TAKE 1 TABLET(5 MG) BY MOUTH DAILY WITH BREAKFAST 03/07/23   Dolphus Reiter, MD  tamsulosin  (FLOMAX ) 0.4 MG CAPS capsule Take 0.4 mg by mouth See admin instructions. Take 0.4 mg by mouth one to two times a day 12/23/18   [provider]  tamsulosin  (FLOMAX ) 0.4 MG CAPS capsule Take 1 capsule (0.4 mg total) by mouth daily. 03/12/23   Thedora Garnette HERO, MD  Tiotropium Bromide  Monohydrate (SPIRIVA  RESPIMAT) 2.5 MCG/ACT AERS Inhale 2 puffs into the lungs daily. 03/12/23   Thedora Garnette HERO, MD  traZODone  (DESYREL ) 50 MG tablet Take 0.5-1 tablets (25-50 mg total) by mouth at bedtime as needed for sleep. 01/08/23   Thedora Garnette HERO, MD  triamcinolone  cream (KENALOG ) 0.1 % Apply 1 Application topically 2 (two) times daily as needed (to itchy sites).    [provider]  trolamine salicylate (ASPERCREME) 10 % cream Apply 1 Application topically 2 (two) times daily as needed (arthritis pain).    [provider]      Allergies    Tape and Celebrex [celecoxib]    Review of Systems   Review of Systems  Eyes:  Positive for redness. Negative for photophobia, pain, discharge, itching and visual disturbance.  All other systems reviewed and are negative.   Physical Exam Updated Vital Signs BP (!) 167/84 (BP Location: Left Arm)   Pulse 89   Temp 97.6 F (36.4 C)  (Oral)   Resp 18   Ht 5' 8 (1.727 m)   Wt 66.2 kg   SpO2 99%   BMI 22.20 kg/m  Physical Exam Vitals and nursing note reviewed.  Constitutional:      General: He is not in acute distress.    Appearance: He is well-developed.  HENT:     Head: Normocephalic and atraumatic.  Eyes:     Intraocular pressure: Left eye pressure is 44 mmHg. Measurements were taken using a handheld tonometer.    Extraocular Movements:     Left eye: Normal extraocular motion and no nystagmus.     Conjunctiva/sclera:     Left eye: Hemorrhage present.  Comments: Hemorrhage in the left eye.  EOMs intact, nonpainful.  Visual acuity intact.  Cardiovascular:     Rate and Rhythm: Normal rate and regular rhythm.     Heart sounds: No murmur heard. Pulmonary:     Effort: Pulmonary effort is normal. No respiratory distress.     Breath sounds: Normal breath sounds.  Abdominal:     Palpations: Abdomen is soft.     Tenderness: There is no abdominal tenderness.  Musculoskeletal:        General: No swelling.     Cervical back: Neck supple.  Skin:    General: Skin is warm and dry.     Capillary Refill: Capillary refill takes less than 2 seconds.  Neurological:     Mental Status: He is alert.  Psychiatric:        Mood and Affect: Mood normal.       ED Results / Procedures / Treatments   Labs (all labs ordered are listed, but only abnormal results are displayed) Labs Reviewed - No data to display  EKG None  Radiology No results found.  Procedures Procedures    Medications Ordered in ED Medications  tetracaine  (PONTOCAINE) 0.5 % ophthalmic solution 1 drop (has no administration in time range)    ED Course/ Medical Decision Making/ A&P Clinical Course as of 03/16/23 1157  Sat Mar 16, 2023  1049 Pressure 44, 48 on repeat [CG]  1136 Start him on Dr maree on Monday 8-12. Cosopt . Dorsolamide, timolo, BID dosing [CG]    Clinical Course User Index [CG] Ruthell Lonni FALCON, PA-C   Medical  Decision Making Risk Prescription drug management.   83 year old male presents for evaluation of subconjunctival hemorrhage in the left eye.  Please see HPI for further details.  On examination the patient is afebrile and nontachycardic.  His lung sounds are clear bilaterally, he is not hypoxic.  Abdomen is soft and compressible.  Neurological examinations at baseline.  EOMs intact, nonpainful.  Visual acuity not decreased.  10 out of 12.5, 10 out of 12.5 bilaterally.  Denies pain to the eye.  Pupils reactive to light.  Patient IOP measured with Tono-Pen, ranges between 44 and 48.  Reached out to Dr. Maree, ophthalmology, who states that patient can be started on Cosopt  drops and he will follow-up in the office on Monday.  Dr. Maree feels as if IOP is appreciated with Tono-Pen may be falsely elevated. Patient is in agreement with plan.  The patient denies any current pain to his eye, denies any decreased vision or blurred vision.  Does not wear contacts.  Patient advised to take Cosopt  twice a day and follow-up with Dr. Maree in office on Monday.   Final Clinical Impression(s) / ED Diagnoses Final diagnoses:  Subconjunctival hemorrhage of left eye    Rx / DC Orders ED Discharge Orders          Ordered    dorzolamide -timolol  (COSOPT ) 2-0.5 % ophthalmic solution  2 times daily        03/16/23 1152                 Sherilee Smotherman F, PA-C 03/16/23 1203    Towana Ozell BROCKS, MD 03/16/23 1745

## 2023-03-20 ENCOUNTER — Telehealth (HOSPITAL_COMMUNITY): Payer: Self-pay

## 2023-03-20 DIAGNOSIS — I5032 Chronic diastolic (congestive) heart failure: Secondary | ICD-10-CM

## 2023-03-20 MED ORDER — CARVEDILOL 12.5 MG PO TABS: 12.5000 mg | ORAL_TABLET | Freq: Two times a day (BID) | ORAL | 0 refills | Status: DC

## 2023-03-20 NOTE — Telephone Encounter (Signed)
Patient's carvedilol medication has been sent to his pharmacy.

## 2023-03-21 ENCOUNTER — Encounter: Payer: Medicare Other | Attending: Physical Medicine and Rehabilitation | Admitting: Registered Nurse

## 2023-03-21 ENCOUNTER — Encounter: Payer: Self-pay | Admitting: Registered Nurse

## 2023-03-21 VITALS — BP 144/69 | HR 63 | Ht 68.0 in | Wt 150.0 lb

## 2023-03-21 DIAGNOSIS — Z5181 Encounter for therapeutic drug level monitoring: Secondary | ICD-10-CM | POA: Diagnosis not present

## 2023-03-21 DIAGNOSIS — G894 Chronic pain syndrome: Secondary | ICD-10-CM | POA: Diagnosis not present

## 2023-03-21 DIAGNOSIS — M47816 Spondylosis without myelopathy or radiculopathy, lumbar region: Secondary | ICD-10-CM | POA: Diagnosis not present

## 2023-03-21 DIAGNOSIS — Z79891 Long term (current) use of opiate analgesic: Secondary | ICD-10-CM

## 2023-03-21 DIAGNOSIS — M5416 Radiculopathy, lumbar region: Secondary | ICD-10-CM

## 2023-03-21 MED ORDER — MORPHINE SULFATE ER 15 MG PO TBCR: 15.0000 mg | EXTENDED_RELEASE_TABLET | Freq: Two times a day (BID) | ORAL | 0 refills | Status: DC

## 2023-03-21 MED ORDER — OXYCODONE HCL 10 MG PO TABS: 10.0000 mg | ORAL_TABLET | ORAL | 0 refills | Status: DC | PRN

## 2023-03-21 MED ORDER — GABAPENTIN 300 MG PO CAPS: 300.0000 mg | ORAL_CAPSULE | Freq: Two times a day (BID) | ORAL | 2 refills | Status: DC

## 2023-03-21 NOTE — Progress Notes (Signed)
Subjective:    Patient ID: Brandon [name], male    DOB: [birth year], 83 y.o.   MRN: [id number]  HPI: Brandon [name] is a 83 y.o. male who returns for follow up appointment for chronic pain and medication refill. He states his pain is located in his lower back radiating into his right lower extremity. He rates his pain 5. His current exercise regime is walking and performing stretching exercises.  Mr. Delaughter arrived to office with  Left Eye Redness, he went to [facility] ED , he was diagnosed with Subconjunctival Hemorrhage. Note was reviewed. He will F/U with his Ophthalmologist. He verbalizes understanding.   Mr. Bulkley Morphine equivalent is 120.00 MME.   Oral Swab was Performed today.      Pain Inventory Average Pain 6 Pain Right Now 5 My pain is tingling and aching  In the last 24 hours, has pain interfered with the following? General activity 5 Relation with others 5 Enjoyment of life 5 What TIME of day is your pain at its worst? morning  and evening Sleep (in general) Fair  Pain is worse with: walking, bending, inactivity, and standing Pain improves with: rest, medication, and injections Relief from Meds: 7  Family History  Problem Relation Age of Onset  . Heart disease Father 65       Living  . Coronary artery disease Father        CABG  . Alzheimer's disease Mother 55       Deceased  . Arthritis Mother   . Aneurysm Brother   . Stomach cancer Maternal Uncle   . Brain cancer Maternal Aunt        x2  . Obesity Daughter        Had Bypass Sx   Social History   Socioeconomic History  . Marital status: Married    Spouse name: etta  . Number of children: 2  . Years of education: Not on file  . Highest education level: Not on file  Occupational History  . Occupation: retired    Associate Professor: RETIRED    Comment: disabled due to back problems  Tobacco Use  . Smoking status: Former    Current packs/day: 0.00    Average packs/day: 1.5 packs/day for 50.0 years (75.0 ttl  pk-yrs)    Types: Cigarettes    Start date: 09/06/1967    Quit date: 09/05/2017    Years since quitting: 5.5    Passive exposure: Never  . Smokeless tobacco: Never  . Tobacco comments:    smoked less than 1 ppd for 40+ years;   Vaping Use  . Vaping status: Never Used  Substance and Sexual Activity  . Alcohol use: Yes    Comment: rarely  . Drug use: Yes    Types: Oxycodone, Morphine    Comment: has prescription  . Sexual activity: Not Currently  Other Topics Concern  . Not on file  Social History Narrative   ** Merged History Encounter **       Married (3rd), [place]. 2 children from 1st marriage, 4 step children.    Retired on disability due to back    Former Runner, broadcasting/film/video.   restores antique furniture for a hobby.       Cell # [phone]   Social Drivers of Health   Financial Resource Strain: Low Risk  (06/15/2022)   Overall Financial Resource Strain (CARDIA)   . Difficulty of Paying Living Expenses: Not hard at all  Food Insecurity:  No Food Insecurity (06/15/2022)   Hunger Vital Sign   . Worried About Programme researcher, broadcasting/film/video in the Last Year: Never true   . Ran Out of Food in the Last Year: Never true  Transportation Needs: No Transportation Needs (06/15/2022)   PRAPARE - Transportation   . Lack of Transportation (Medical): No   . Lack of Transportation (Non-Medical): No  Physical Activity: Inactive (06/15/2022)   Exercise Vital Sign   . Days of Exercise per Week: 0 days   . Minutes of Exercise per Session: 0 min  Stress: No Stress Concern Present (06/15/2022)   [employer] of Occupational Health - Occupational Stress Questionnaire   . Feeling of Stress : Not at all  Social Connections: Moderately Integrated (06/07/2021)   Social Connection and Isolation Panel [NHANES]   . Frequency of Communication with Friends and Family: Twice a week   . Frequency of Social Gatherings with Friends and Family: Twice a week   . Attends Religious Services: More than  4 times per year   . Active Member of Clubs or Organizations: No   . Attends Banker Meetings: Never   . Marital Status: Married   Past Surgical History:  Procedure Laterality Date  . ABDOMINAL AORTIC ENDOVASCULAR STENT GRAFT N/A 03/03/2021   Procedure: ABDOMINAL AORTIC ENDOVASCULAR STENT GRAFT;  Surgeon: Maeola Harman, MD;  Location: [facility] OR;  Service: Vascular;  Laterality: N/A;  . ABDOMINAL AORTOGRAM W/LOWER EXTREMITY N/A 04/10/2021   Procedure: ABDOMINAL AORTOGRAM W/LOWER EXTREMITY;  Surgeon: Maeola Harman, MD;  Location: [facility] INVASIVE CV LAB;  Service: Cardiovascular;  Laterality: N/A;  . BACK SURGERY    . CARDIAC CATHETERIZATION    . CATARACT EXTRACTION W/ INTRAOCULAR LENS  IMPLANT, BILATERAL Bilateral 01/2012 - 02/2012  . CORONARY ATHERECTOMY N/A 04/27/2020   Procedure: CORONARY ATHERECTOMY;  Surgeon: Tonny Bollman, MD;  Location: [facility] INVASIVE CV LAB;  Service: Cardiovascular;  Laterality: N/A;  . CORONARY IMAGING/OCT N/A 04/27/2020   Procedure: INTRAVASCULAR IMAGING/OCT;  Surgeon: Tonny Bollman, MD;  Location: [facility] INVASIVE CV LAB;  Service: Cardiovascular;  Laterality: N/A;  . CORONARY PRESSURE/FFR STUDY N/A 04/27/2020   Procedure: INTRAVASCULAR PRESSURE WIRE/FFR STUDY;  Surgeon: Tonny Bollman, MD;  Location: [facility] INVASIVE CV LAB;  Service: Cardiovascular;  Laterality: N/A;  . CORONARY STENT INTERVENTION N/A 04/27/2020   Procedure: CORONARY STENT INTERVENTION;  Surgeon: Tonny Bollman, MD;  Location: [facility] INVASIVE CV LAB;  Service: Cardiovascular;  Laterality: N/A;  . DIRECT LARYNGOSCOPY Left 10/09/2017   Procedure: DIRECT LARYNGOSCOPY WITH BOPSY;  Surgeon: Flo Shanks, MD;  Location: [facility];  Service: ENT;  Laterality: Left;  . ESOPHAGOGASTRODUODENOSCOPY (EGD) WITH ESOPHAGEAL DILATION     "couple times" (07/19/2016)  . ESOPHAGOSCOPY Left 10/09/2017   Procedure: ESOPHAGOSCOPY;  Surgeon: Flo Shanks, MD;  Location: [facility];  Service: ENT;  Laterality: Left;  . IR GASTROSTOMY TUBE MOD SED  01/08/2018  . IR THORACENTESIS ASP PLEURAL SPACE W/IMG GUIDE  07/19/2016  . LAPAROSCOPIC CHOLECYSTECTOMY  1984  . LUMBAR DISC SURGERY  05/1996   L4-5; Dr. Newell Coral   . LUMBAR LAMINECTOMY/DECOMPRESSION MICRODISCECTOMY  10/2002   L3-4. Dr. Newell Coral  . MULTIPLE TOOTH EXTRACTIONS  1980s  . PARTIAL GLOSSECTOMY  12/02/2017   Dr. Hezzie [facility]ump[facility]  . pharyngoplasty for closure of tingue base defect  12/02/2017   Dr. Hezzie Bump- [facility]  . PICC LINE INSERTION  07/15/2016  . POSTERIOR LUMBAR FUSION  09/1996   Ray cage, L4-5 Dr. Jule Ser  . PROSTATE  BIOPSY  ~ 2017  . radical tonsillectomy Left 12/02/2017   Dr. [facility]zzie [facility]ump at [facility]  . RIGHT HEART CATH AND CORONARY ANGIOGRAPHY N/A 03/31/2020   Procedure: RIGHT HEART CATH AND CORONARY ANGIOGRAPHY;  Surgeon: Laurey Morale, MD;  Location: [facility] INVASIVE CV LAB;  Service: Cardiovascular;  Laterality: N/A;  . RIGID BRONCHOSCOPY Left 10/09/2017   Procedure: RIGID BRONCHOSCOPY;  Surgeon: Flo Shanks, MD;  Location: [facility];  Service: ENT;  Laterality: Left;  . TEE WITHOUT CARDIOVERSION N/A 06/07/2020   Procedure: TRANSESOPHAGEAL ECHOCARDIOGRAM (TEE);  Surgeon: Tonny Bollman, MD;  Location: [facility] OR;  Service: Open Heart Surgery;  Laterality: N/A;  . TONSILLECTOMY    . TOTAL HIP ARTHROPLASTY Left 12/04/2020   Procedure: TOTAL HIP ARTHROPLASTY ANTERIOR APPROACH;  Surgeon: Samson Frederic, MD;  Location: [facility];  Service: Orthopedics;  Laterality: Left;  . TOTAL HIP ARTHROPLASTY Left 11/2020  . TRACHEOSTOMY  12/02/2017   Dr. Hezzie Bump- [facility]  . ULTRASOUND GUIDANCE FOR VASCULAR ACCESS Right 06/07/2020   Procedure: ULTRASOUND GUIDANCE FOR VASCULAR ACCESS;  Surgeon: Tonny Bollman, MD;  Location[facility] OR;  Service: Open Heart Surgery;  Laterality: Right;  . ULTRASOUND GUIDANCE FOR VASCULAR ACCESS Bilateral 03/03/2021   Procedure: ULTRASOUND GUIDANCE FOR VASCULAR ACCESS,  BILATERAL FEMORAL ARTERIES;  Surgeon: Maeola Harman, MD;  Location: [facility] OR;  Service: Vascular;  Laterality: Bilateral;  . VASCULAR SURGERY     Past Surgical History:  Procedure Laterality Date  . ABDOMINAL AORTIC ENDOVASCULAR STENT GRAFT N/A 03/03/2021   Procedure: ABDOMINAL AORTIC ENDOVASCULAR STENT GRAFT;  Surgeon: Maeola Harman, MD;  Location: [facility] OR;  Service: Vascular;  Laterality: N/A;  . ABDOMINAL AORTOGRAM W/LOWER EXTREMITY N/A 04/10/2021   Procedure: ABDOMINAL AORTOGRAM W/LOWER EXTREMITY;  Surgeon: Maeola Harman, MD;  Location: [facility] INVASIVE CV LAB;  Service: Cardiovascular;  Laterality: N/A;  . BACK SURGERY    . CARDIAC CATHETERIZATION    . CATARACT EXTRACTION W/ INTRAOCULAR LENS  IMPLANT, BILATERAL Bilateral 01/2012 - 02/2012  . CORONARY ATHERECTOMY N/A 04/27/2020   Procedure: CORONARY ATHERECTOMY;  Surgeon: Tonny Bollman, MD;  Location: [facility] INVASIVE CV LAB;  Service: Cardiovascular;  Laterality: N/A;  . CORONARY IMAGING/OCT N/A 04/27/2020   Procedure: INTRAVASCULAR IMAGING/OCT;  Surgeon: Tonny Bollman, MD;  Location: [facility] INVASIVE CV LAB;  Service: Cardiovascular;  Laterality: N/A;  . CORONARY PRESSURE/FFR STUDY N/A 04/27/2020   Procedure: INTRAVASCULAR PRESSURE WIRE/FFR STUDY;  Surgeon: Tonny Bollman, MD;  Location: [facility] INVASIVE CV LAB;  Service: Cardiovascular;  Laterality: N/A;  . CORONARY STENT INTERVENTION N/A 04/27/2020   Procedure: CORONARY STENT INTERVENTION;  Surgeon: Tonny Bollman, MD;  Location: [facility] INVASIVE CV LAB;  Service: Cardiovascular;  Laterality: N/A;  . DIRECT LARYNGOSCOPY Left 10/09/2017   Procedure: DIRECT LARYNGOSCOPY WITH BOPSY;  Surgeon: Flo Shanks, MD;  Location: [facility];  Service: ENT;  Laterality: Left;  . ESOPHAGOGASTRODUODENOSCOPY (EGD) WITH ESOPHAGEAL DILATION     "couple times" (07/19/2016)  . ESOPHAGOSCOPY Left 10/09/2017   Procedure: ESOPHAGOSCOPY;  Surgeon: Flo Shanks, MD;  Location: [facility];  Service: ENT;  Laterality: Left;  . IR GASTROSTOMY TUBE MOD SED  01/08/2018  . IR THORACENTESIS ASP PLEURAL SPACE W/IMG GUIDE  07/19/2016  . LAPAROSCOPIC CHOLECYSTECTOMY  1984  . LUMBAR DISC SURGERY  05/1996   L4-5; Dr. Newell Coral   . LUMBAR LAMINECTOMY/DECOMPRESSION MICRODISCECTOMY  10/2002   L3-4. Dr. Newell Coral  . MULTIPLE TOOTH EXTRACTIONS  1980s  . PARTIAL GLOSSECTOMY  12/02/2017   Dr[facility]- [facility]  .  pharyngoplasty for closure of tingue base defect  12/02/2017   Dr. Hezzie Bump- [facility]  . PICC LINE INSERTION  07/15/2016  . POSTERIOR LUMBAR FUSION  09/1996   Ray cage, L4-5 Dr. Jule Ser  . PROSTATE BIOPSY  ~ 2017  . radical tonsillectomy Left 12/02/2017   Dr. [facility]zzie Bump at [facility]  . RIGHT HEART CATH AND CORONARY ANGIOGRAPHY N/A 03/31/2020   Procedure: RIGHT HEART CATH AND CORONARY ANGIOGRAPHY;  Surgeon: Laurey Morale, MD;  Location: [facility] INVASIVE CV LAB;  Service: Cardiovascular;  Laterality: N/A;  . RIGID BRONCHOSCOPY Left 10/09/2017   Procedure: RIGID BRONCHOSCOPY;  Surgeon: Flo Shanks, MD;  Location: [facility];  Service: ENT;  Laterality: Left;  . TEE WITHOUT CARDIOVERSION N/A 06/07/2020   Procedure: TRANSESOPHAGEAL ECHOCARDIOGRAM (TEE);  Surgeon: Tonny Bollman, MD;  Location: [facility] OR;  Service: Open Heart Surgery;  Laterality: N/A;  . TONSILLECTOMY    . TOTAL HIP ARTHROPLASTY Left 12/04/2020   Procedure: TOTAL HIP ARTHROPLASTY ANTERIOR APPROACH;  Surgeon: Samson Frederic, MD;  Location: [facility];  Service: Orthopedics;  Laterality: Left;  . TOTAL HIP ARTHROPLASTY Left 11/2020  . TRACHEOSTOMY  12/02/2017   Dr. Hezzie Bump- [facility]  . ULTRASOUND GUIDANCE FOR VASCULAR ACCESS Right 06/07/2020   Procedure: ULTRASOUND GUIDANCE FOR VASCULAR ACCESS;  Surgeon: Tonny Bollman, MD;  Location: [facility] OR;  Service: Open Heart Surgery;  Laterality: Right;  . ULTRASOUND GUIDANCE FOR VASCULAR ACCESS Bilateral 03/03/2021   Procedure: ULTRASOUND GUIDANCE FOR VASCULAR  ACCESS, BILATERAL FEMORAL ARTERIES;  Surgeon: Maeola Harman, MD;  Location: [facility] OR;  Service: Vascular;  Laterality: Bilateral;  . VASCULAR SURGERY     Past Medical History:  Diagnosis Date  . Abdominal aneurysm (HCC)   . Arthritis    "all over" (07/19/2016)  . BPH (benign prostatic hypertrophy)   . CAD (coronary artery disease)   . Chicken pox   . Chronic lower back pain    s/p surgical fusion  . Depression   . Diverticulosis   . Esophageal stricture   . GERD (gastroesophageal reflux disease)   . Hepatitis B 1984  . Hiatal hernia   . History of radiation therapy 01/16/18- 03/05/18   Left Tonsil, 66 Gy in 33 fractions to high risk nodal echelons.   Marland Kitchen HLD (hyperlipidemia)   . HTN (hypertension)   . 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 Osteoarthritis   . 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 Excell Seltzer and Dr Laneta Simmers   . Severe aortic stenosis   . TIA (transient ischemic attack) 1990s   hx  . tonsillar ca dx'd 11/2017  . Tubular adenoma of colon 2009   BP (!) 144/69   Pulse 63   Ht 5\' 8"  (1.727 m)   Wt 150 lb (68 kg)   SpO2 98%   BMI 22.81 kg/m   Opioid Risk Score:   Fall Risk Score:  `1  Depression screen [facility] 2/9     01/08/2023    9:52 AM 09/24/2022   11:03 AM 07/16/2022   11:23 AM 06/26/2022    8:06 AM 06/15/2022    9:49 AM 06/11/2022    2:09 PM 05/16/2022   10:56 AM  Depression screen PHQ 2/9  Decreased Interest 0 0 2 1 0 0 1  Down, Depressed, Hopeless 0 0 1 1 0 0   PHQ - 2 Score 0 0 3 2 0 0 1  Altered sleeping  3     Tired, decreased energy    3     Change in appetite    3     Feeling bad or failure about yourself     2     Trouble concentrating    0     Moving slowly or fidgety/restless    1     Suicidal thoughts    0     PHQ-9 Score    14     Difficult doing work/chores    Somewhat difficult       Review of Systems  Musculoskeletal:  Positive for back pain  and gait problem.       Pain down the back of right leg to calf  All other systems reviewed and are negative.     Objective:   Physical Exam Vitals and nursing note reviewed.  Constitutional:      Appearance: Normal appearance.  Cardiovascular:     Rate and Rhythm: Normal rate and regular rhythm.     Pulses: Normal pulses.     Heart sounds: Normal heart sounds.  Pulmonary:     Effort: Pulmonary effort is normal.     Breath sounds: Normal breath sounds.  Musculoskeletal:     Comments: Normal Muscle Bulk and Muscle Testing Reveals:  Upper Extremities:Full  ROM and Muscle Strength 5/5 Left AC Joint Tenderness  Lumbar Paraspinal Tenderness: L-3-L-5 Lower Extremities: Full ROM and Muscle Strength 5/5 Arises from Table slowly using cane for support Narrow Based  Gait     Skin:    General: Skin is warm and dry.  Neurological:     Mental Status: He is alert and oriented to person, place, and time.  Psychiatric:        Mood and Affect: Mood normal.        Behavior: Behavior normal.         Assessment & Plan:  Lumbar Post-Laminectomy Syndrome: Lumbar Spondylosis Continue HEP as Tolerated. Continue to Monitor. 03/21/2023 Lumbar Radiculitis: Continue HEP as Tolerated. Continue to Monitor. Mr. Culotta has stopped taking his gabapentin, he reports it was ineffective.  Continue to Monitor. 03/21/2023 Chronic Pain Syndrome: Continue MS Contin 15 mg every 12 hours #60,  Refilled: Oxycodone 10 mg one tablet every 4 hours #150. Second script sent to accommodate scheduled appointment. We will continue the opioid monitoring program, this consists of regular clinic visits, examinations, urine drug screen, pill counts as well as use of [place] Controlled Substance Reporting system. A 12 month History has been reviewed on the [place] Controlled Substance Reporting System 03/21/2023   4. Restless Leg Syndrome: No Complaints today. Continue current medication regimen. Continue to Monitor.     F/U in 2 months

## 2023-03-23 ENCOUNTER — Other Ambulatory Visit (HOSPITAL_COMMUNITY): Payer: Self-pay | Admitting: Cardiology

## 2023-03-23 ENCOUNTER — Other Ambulatory Visit: Payer: Self-pay | Admitting: Family Medicine

## 2023-03-23 DIAGNOSIS — I10 Essential (primary) hypertension: Secondary | ICD-10-CM

## 2023-03-25 ENCOUNTER — Ambulatory Visit: Payer: Medicare Other | Admitting: Family Medicine

## 2023-03-26 LAB — DRUG TOX MONITOR 1 W/CONF, ORAL FLD
Amphetamines: NEGATIVE ng/mL (ref <10)
Barbiturates: NEGATIVE ng/mL (ref <10)
Benzodiazepines: NEGATIVE ng/mL (ref <0.50)
Buprenorphine: NEGATIVE ng/mL (ref <0.10)
Cocaine: NEGATIVE ng/mL (ref <5.0)
Codeine: NEGATIVE ng/mL (ref <2.5)
Cotinine: 214.2 ng/mL — ABNORMAL HIGH (ref <5.0)
Dihydrocodeine: NEGATIVE ng/mL (ref <2.5)
Fentanyl: NEGATIVE ng/mL (ref <0.10)
Heroin Metabolite: NEGATIVE ng/mL (ref <1.0)
Hydrocodone: NEGATIVE ng/mL (ref <2.5)
Hydromorphone: NEGATIVE ng/mL (ref <2.5)
MARIJUANA: NEGATIVE ng/mL (ref <2.5)
MDMA: NEGATIVE ng/mL (ref <10)
Meprobamate: NEGATIVE ng/mL (ref <2.5)
Methadone: NEGATIVE ng/mL (ref <5.0)
Morphine: 10.6 ng/mL — ABNORMAL HIGH (ref <2.5)
Nicotine Metabolite: POSITIVE ng/mL — AB (ref <5.0)
Norhydrocodone: NEGATIVE ng/mL (ref <2.5)
Noroxycodone: 107.9 ng/mL — ABNORMAL HIGH (ref <2.5)
Opiates: POSITIVE ng/mL — AB (ref <2.5)
Oxycodone: >250 ng/mL — ABNORMAL HIGH (ref <2.5)
Oxymorphone: NEGATIVE ng/mL (ref <2.5)
Phencyclidine: NEGATIVE ng/mL (ref <10)
Tapentadol: NEGATIVE ng/mL (ref <5.0)
Tramadol: NEGATIVE ng/mL (ref <5.0)
Zolpidem: NEGATIVE ng/mL (ref <5.0)

## 2023-03-26 LAB — DRUG TOX ALC METAB W/CON, ORAL FLD: Alcohol Metabolite: NEGATIVE ng/mL (ref <25)

## 2023-04-22 DIAGNOSIS — H52203 Unspecified astigmatism, bilateral: Secondary | ICD-10-CM | POA: Diagnosis not present

## 2023-04-23 ENCOUNTER — Ambulatory Visit: Payer: Medicare Other | Admitting: Family Medicine

## 2023-04-24 ENCOUNTER — Ambulatory Visit: Payer: Medicare Other | Admitting: Physician Assistant

## 2023-05-04 ENCOUNTER — Other Ambulatory Visit: Payer: Self-pay | Admitting: Family Medicine

## 2023-05-04 DIAGNOSIS — G4700 Insomnia, unspecified: Secondary | ICD-10-CM

## 2023-05-08 DIAGNOSIS — M533 Sacrococcygeal disorders, not elsewhere classified: Secondary | ICD-10-CM | POA: Diagnosis not present

## 2023-05-13 ENCOUNTER — Other Ambulatory Visit: Payer: Self-pay

## 2023-05-13 DIAGNOSIS — I714 Abdominal aortic aneurysm, without rupture, unspecified: Secondary | ICD-10-CM

## 2023-05-15 ENCOUNTER — Encounter: Payer: Self-pay | Admitting: Physical Medicine and Rehabilitation

## 2023-05-15 ENCOUNTER — Telehealth: Payer: Self-pay | Admitting: Rheumatology

## 2023-05-15 ENCOUNTER — Encounter
Payer: Medicare Other | Attending: Physical Medicine and Rehabilitation | Admitting: Physical Medicine and Rehabilitation

## 2023-05-15 VITALS — BP 146/81 | HR 86 | Ht 68.0 in | Wt 144.0 lb

## 2023-05-15 DIAGNOSIS — M353 Polymyalgia rheumatica: Secondary | ICD-10-CM | POA: Insufficient documentation

## 2023-05-15 DIAGNOSIS — M5416 Radiculopathy, lumbar region: Secondary | ICD-10-CM | POA: Diagnosis not present

## 2023-05-15 DIAGNOSIS — Z79891 Long term (current) use of opiate analgesic: Secondary | ICD-10-CM | POA: Insufficient documentation

## 2023-05-15 DIAGNOSIS — G894 Chronic pain syndrome: Secondary | ICD-10-CM | POA: Diagnosis not present

## 2023-05-15 MED ORDER — MORPHINE SULFATE ER 15 MG PO TBCR: 15.0000 mg | EXTENDED_RELEASE_TABLET | Freq: Two times a day (BID) | ORAL | 0 refills | Status: DC

## 2023-05-15 MED ORDER — OXYCODONE HCL 10 MG PO TABS: 10.0000 mg | ORAL_TABLET | ORAL | 0 refills | Status: DC | PRN

## 2023-05-15 NOTE — Telephone Encounter (Signed)
 Please advise. Patient had ultrasound-guided injection into the glenohumeral joint by Dr. August Saucer on 02/27/2023. You have an appointment available on 05/20/2023.

## 2023-05-15 NOTE — Progress Notes (Signed)
 Subjective:    Patient ID: Brandon [name], male    DOB: [birth year], 83 y.o.   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 S/p L THR- 12/02/20  Getting AAA repair as of 1/23. Pt also had  5th R finger fx- out of brace now.     Had a shot L shoulder for RTC by Ortho- and then Rheum, Dr Valere Dross going to do in 2 weeks to help. Will do at end of April   Was feeling better when was warm last week, but worse with cold today and yesterday.    Dropped his Oxycodone and a few went in heater vent- missing 3-5 pills and couldn't get them out.    Has been having problems with getting shirt and coat on- and getting dressed.   Pain meds- only help a little-   Dr Marvia Pickles at Ortho gave him a shot in back 1 week ago and last few days getting better.   Last oral drug screen was 03/21/23-       Pain Inventory Average Pain 6 Pain Right Now 6 My pain is sharp, stabbing, and aching  In the last 24 hours, has pain interfered with the following? General activity 5 Relation with others 5 Enjoyment of life 5 What TIME of day is your pain at its worst? morning , daytime, and evening Sleep (in general) Fair   a lot of shoulder pain  Pain is worse with: bending, standing, and some activites Pain improves with: medication and injections Relief from Meds: 6  Family History  Problem Relation Age of Onset   Heart disease Father 47       Living   Coronary artery disease Father        CABG   Alzheimer's disease Mother 59       Deceased   Arthritis Mother    Aneurysm Brother    Stomach cancer Maternal Uncle    Brain  cancer Maternal Aunt        x2   Obesity Daughter        Had Bypass Sx   Social History   Socioeconomic History   Marital status: Married    Spouse name: etta   Number of children: 2   Years of education: Not on file   Highest education level: Not on file  Occupational History   Occupation: retired    Associate Professor: RETIRED    Comment: disabled due to back problems  Tobacco Use   Smoking status: Former    Current packs/day: 0.00    Average packs/day: 1.5 packs/day for 50.0 years (75.0 ttl pk-yrs)    Types: Cigarettes    Start date: 09/06/1967    Quit date: 09/05/2017    Years since quitting: 5.6    Passive exposure: Never   Smokeless tobacco: Never   Tobacco comments:    smoked less than 1 ppd for 40+ years;   Vaping Use   Vaping status: Never Used  Substance and Sexual Activity   Alcohol use: Yes  Comment: rarely   Drug use: Yes    Types: Oxycodone, Morphine    Comment: has prescription   Sexual activity: Not Currently  Other Topics Concern   Not on file  Social History Narrative   ** Merged History Encounter **       Married (3rd), [place]. 2 children from 1st marriage, 4 step children.    Retired on disability due to back    Former Runner, broadcasting/film/video.   restores antique furniture for a hobby.       Cell # [phone]   Social Drivers of Health   Financial Resource Strain: Low Risk  (06/15/2022)   Overall Financial Resource Strain (CARDIA)    Difficulty of Paying Living Expenses: Not hard at all  Food Insecurity: No Food Insecurity (06/15/2022)   Hunger Vital Sign    Worried About Running Out of Food in the Last Year: Never true    Ran Out of Food in the Last Year: Never true  Transportation Needs: No Transportation Needs (06/15/2022)   PRAPARE - Administrator, Civil Service (Medical): No    Lack of Transportation (Non-Medical): No  Physical Activity: Inactive (06/15/2022)   Exercise Vital Sign    Days of Exercise per Week: 0 days     Minutes of Exercise per Session: 0 min  Stress: No Stress Concern Present (06/15/2022)   [employer] of Occupational Health - Occupational Stress Questionnaire    Feeling of Stress : Not at all  Social Connections: Moderately Integrated (06/07/2021)   Social Connection and Isolation Panel [NHANES]    Frequency of Communication with Friends and Family: Twice a week    Frequency of Social Gatherings with Friends and Family: Twice a week    Attends Religious Services: More than 4 times per year    Active Member of [employer] or Organizations: No    Attends Banker Meetings: Never    Marital Status: Married   Past Surgical History:  Procedure Laterality Date   ABDOMINAL AORTIC ENDOVASCULAR STENT GRAFT N/A 03/03/2021   Procedure: ABDOMINAL AORTIC ENDOVASCULAR STENT GRAFT;  Surgeon: Maeola Harman, MD;  Location: [facility] OR;  Service: Vascular;  Laterality: N/A;   ABDOMINAL AORTOGRAM W/LOWER EXTREMITY N/A 04/10/2021   Procedure: ABDOMINAL AORTOGRAM W/LOWER EXTREMITY;  Surgeon: Maeola Harman, MD;  Location: [facility] INVASIVE CV LAB;  Service: Cardiovascular;  Laterality: N/A;   BACK SURGERY     CARDIAC CATHETERIZATION     CATARACT EXTRACTION W/ INTRAOCULAR LENS  IMPLANT, BILATERAL Bilateral 01/2012 - 02/2012   CORONARY ATHERECTOMY N/A 04/27/2020   Procedure: CORONARY ATHERECTOMY;  Surgeon: Tonny Bollman, MD;  Location: [facility] INVASIVE CV LAB;  Service: Cardiovascular;  Laterality: N/A;   CORONARY IMAGING/OCT N/A 04/27/2020   Procedure: INTRAVASCULAR IMAGING/OCT;  Surgeon: Tonny Bollman, MD;  Location: [facility] INVASIVE CV LAB;  Service: Cardiovascular;  Laterality: N/A;   CORONARY PRESSURE/FFR STUDY N/A 04/27/2020   Procedure: INTRAVASCULAR PRESSURE WIRE/FFR STUDY;  Surgeon: Tonny Bollman, MD;  Location: [facility] INVASIVE CV LAB;  Service: Cardiovascular;  Laterality: N/A;   CORONARY STENT INTERVENTION N/A 04/27/2020   Procedure: CORONARY STENT INTERVENTION;  Surgeon: Tonny Bollman,  MD;  Location: [facility] INVASIVE CV LAB;  Service: Cardiovascular;  Laterality: N/A;   DIRECT LARYNGOSCOPY Left 10/09/2017   Procedure: DIRECT LARYNGOSCOPY WITH BOPSY;  Surgeon: Flo Shanks, MD;  Location: [facility];  Service: ENT;  Laterality: Left;   ESOPHAGOGASTRODUODENOSCOPY (EGD) WITH ESOPHAGEAL DILATION     "couple times" (07/19/2016)  ESOPHAGOSCOPY Left 10/09/2017   Procedure: ESOPHAGOSCOPY;  Surgeon: Flo Shanks, MD;  Location: [facility];  Service: ENT;  Laterality: Left;   IR GASTROSTOMY TUBE MOD SED  01/08/2018   IR THORACENTESIS ASP PLEURAL SPACE W/IMG GUIDE  07/19/2016   LAPAROSCOPIC CHOLECYSTECTOMY  1984   LUMBAR DISC SURGERY  05/1996   L4-5; Dr. Rosanne Sack LAMINECTOMY/DECOMPRESSION MICRODISCECTOMY  10/2002   L3-4. Dr. Newell Coral   MULTIPLE TOOTH EXTRACTIONS  1980s   PARTIAL GLOSSECTOMY  12/02/2017   Dr. Hezzie Bump- [facility]   pharyngoplasty for closure of tingue base defect  12/02/2017   Dr. Hezzie Bump- [facility]   PICC LINE INSERTION  07/15/2016   POSTERIOR LUMBAR FUSION  09/1996   Ray cage, L4-5 Dr. Jule Ser   PROSTATE BIOPSY  ~ 2017   radical tonsillectomy Left 12/02/2017   Dr. [facility]zzie [facility]ump at [facility]   RIGHT HEART CATH AND CORONARY ANGIOGRAPHY N/A 03/31/2020   Procedure: RIGHT HEART CATH AND CORONARY ANGIOGRAPHY;  Surgeon: Laurey Morale, MD;  Location: [facility] INVASIVE CV LAB;  Service: Cardiovascular;  Laterality: N/A;   RIGID BRONCHOSCOPY Left 10/09/2017   Procedure: RIGID BRONCHOSCOPY;  Surgeon: Flo Shanks, MD;  Location: [facility];  Service: ENT;  Laterality: Left;   TEE WITHOUT CARDIOVERSION N/A 06/07/2020   Procedure: TRANSESOPHAGEAL ECHOCARDIOGRAM (TEE);  Surgeon: Tonny Bollman, MD;  Location: [facility] OR;  Service: Open Heart Surgery;  Laterality: N/A;   TONSILLECTOMY     TOTAL HIP ARTHROPLASTY Left 12/04/2020   Procedure: TOTAL HIP ARTHROPLASTY ANTERIOR APPROACH;  Surgeon: Samson Frederic, MD;  Location: [facility] ORS;  Service:  Orthopedics;  Laterality: Left;   TOTAL HIP ARTHROPLASTY Left 11/2020   TRACHEOSTOMY  12/02/2017   Dr. Hezzie Bump- [facility]   ULTRASOUND GUIDANCE FOR VASCULAR ACCESS Right 06/07/2020   Procedure: ULTRASOUND GUIDANCE FOR VASCULAR ACCESS;  Surgeon: Tonny Bollman, MD;  Location: [facility] OR;  Service: Open Heart Surgery;  Laterality: Right;   ULTRASOUND GUIDANCE FOR VASCULAR ACCESS Bilateral 03/03/2021   Procedure: ULTRASOUND GUIDANCE FOR VASCULAR ACCESS, BILATERAL FEMORAL ARTERIES;  Surgeon: Maeola Harman, MD;  Location: [facility] OR;  Service: Vascular;  Laterality: Bilateral;   VASCULAR SURGERY     Past Surgical History:  Procedure Laterality Date   ABDOMINAL AORTIC ENDOVASCULAR STENT GRAFT N/A 03/03/2021   Procedure: ABDOMINAL AORTIC ENDOVASCULAR STENT GRAFT;  Surgeon: Maeola Harman, MD;  Location: [facility] OR;  Service: Vascular;  Laterality: N/A;   ABDOMINAL AORTOGRAM W/LOWER EXTREMITY N/A 04/10/2021   Procedure: ABDOMINAL AORTOGRAM W/LOWER EXTREMITY;  Surgeon: Maeola Harman, MD;  Location: [facility] INVASIVE CV LAB;  Service: Cardiovascular;  Laterality: N/A;   BACK SURGERY     CARDIAC CATHETERIZATION     CATARACT EXTRACTION W/ INTRAOCULAR LENS  IMPLANT, BILATERAL Bilateral 01/2012 - 02/2012   CORONARY ATHERECTOMY N/A 04/27/2020   Procedure: CORONARY ATHERECTOMY;  Surgeon: Tonny Bollman, MD;  Location: [facility] INVASIVE CV LAB;  Service: Cardiovascular;  Laterality: N/A;   CORONARY IMAGING/OCT N/A 04/27/2020   Procedure: INTRAVASCULAR IMAGING/OCT;  Surgeon: Tonny Bollman, MD;  Location: [facility] INVASIVE CV LAB;  Service: Cardiovascular;  Laterality: N/A;   CORONARY PRESSURE/FFR STUDY N/A 04/27/2020   Procedure: INTRAVASCULAR PRESSURE WIRE/FFR STUDY;  Surgeon: Tonny Bollman, MD;  Location: [facility] INVASIVE CV LAB;  Service: Cardiovascular;  Laterality: N/A;   CORONARY STENT INTERVENTION N/A 04/27/2020   Procedure: CORONARY STENT INTERVENTION;  Surgeon: Tonny Bollman, MD;  Location: [facility] INVASIVE  CV LAB;  Service: Cardiovascular;  Laterality: N/A;   DIRECT LARYNGOSCOPY Left 10/09/2017  Procedure: DIRECT LARYNGOSCOPY WITH BOPSY;  Surgeon: Flo Shanks, MD;  Location: [facility];  Service: ENT;  Laterality: Left;   ESOPHAGOGASTRODUODENOSCOPY (EGD) WITH ESOPHAGEAL DILATION     "couple times" (07/19/2016)   ESOPHAGOSCOPY Left 10/09/2017   Procedure: ESOPHAGOSCOPY;  Surgeon: Flo Shanks, MD;  Location: [facility];  Service: ENT;  Laterality: Left;   IR GASTROSTOMY TUBE MOD SED  01/08/2018   IR THORACENTESIS ASP PLEURAL SPACE W/IMG GUIDE  07/19/2016   LAPAROSCOPIC CHOLECYSTECTOMY  1984   LUMBAR DISC SURGERY  05/1996   L4-5; Dr. Rosanne Sack LAMINECTOMY/DECOMPRESSION MICRODISCECTOMY  10/2002   L3-4. Dr. Newell Coral   MULTIPLE TOOTH EXTRACTIONS  1980s   PARTIAL GLOSSECTOMY  12/02/2017   Dr. [facility]zzie [facility]ump- [facility]   pharyngoplasty for closure of tingue base defect  12/02/2017   Dr. Hezzie [facility]ump- [facility]   PICC LINE INSERTION  07/15/2016   POSTERIOR LUMBAR FUSION  09/1996   Ray cage, L4-5 Dr. Jule Ser   PROSTATE BIOPSY  ~ 2017   radical tonsillectomy Left 12/02/2017   Dr. [facility]zz[facility] Bump at [facility]   RIGHT HEART CATH AND CORONARY ANGIOGRAPHY N/A 03/31/2020   Procedure: RIGHT HEART CATH AND CORONARY ANGIOGRAPHY;  Surgeon: Laurey Morale, MD;  Location: [facility] INVASIVE CV LAB;  Service: Cardiovascular;  Laterality: N/A;   RIGID BRONCHOSCOPY Left 10/09/2017   Procedure: RIGID BRONCHOSCOPY;  Surgeon: Flo Shanks, MD;  Location: [facility];  Service: ENT;  Laterality: Left;   TEE WITHOUT CARDIOVERSION N/A 06/07/2020   Procedure: TRANSESOPHAGEAL ECHOCARDIOGRAM (TEE);  Surgeon: Tonny Bollman, MD;  Location: [facility] OR;  Service: Open Heart Surgery;  Laterality: N/A;   TONSILLECTOMY     TOTAL HIP ARTHROPLASTY Left 12/04/2020   Procedure: TOTAL HIP ARTHROPLASTY ANTERIOR APPROACH;  Surgeon: Samson Frederic, MD;  Location: [facility];  Service: Orthopedics;  Laterality:  Left;   TOTAL HIP ARTHROPLASTY Left 11/2020   TRACHEOSTOMY  12/02/2017   Dr. Hezzie Bump- [facility]   ULTRASOUND GUIDANCE FOR VASCULAR ACCESS Right 06/07/2020   Procedure: ULTRASOUND GUIDANCE FOR VASCULAR ACCESS;  Surgeon: Tonny Bollman, MD;  Location: [facility] OR;  Service: Open Heart Surgery;  Laterality: Right;   ULTRASOUND GUIDANCE FOR VASCULAR ACCESS Bilateral 03/03/2021   Procedure: ULTRASOUND GUIDANCE FOR VASCULAR ACCESS, BILATERAL FEMORAL ARTERIES;  Surgeon: Maeola Harman, MD;  Location: [facility] OR;  Service: Vascular;  Laterality: Bilateral;   VASCULAR SURGERY     Past Medical History:  Diagnosis Date   Abdominal aneurysm (HCC)    Arthritis    "all over" (07/19/2016)   BPH (benign prostatic hypertrophy)    CAD (coronary artery disease)    Chicken pox    Chronic lower back pain    s/p surgical fusion   Depression    Diverticulosis    Esophageal stricture    GERD (gastroesophageal reflux disease)    Hepatitis B 1984   Hiatal hernia    History of radiation therapy 01/16/18- 03/05/18   Left Tonsil, 66 Gy in 33 fractions to high risk nodal echelons.    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 Excell Seltzer and Dr Laneta Simmers    Severe aortic stenosis    TIA (transient ischemic attack) 1990s   hx   tonsillar ca dx'd 11/2017   Tubular adenoma of colon 2009   BP Marland Kitchen)  146/81   Pulse 86   Ht 5\' 8"  (1.727 m)   Wt 144 lb (65.3 kg)   SpO2 95%   BMI 21.90 kg/m   Opioid Risk Score:   Fall Risk Score:  `1  Depression screen [facility] 2/9     05/15/2023   11:10 AM 01/08/2023    9:52 AM 09/24/2022   11:03 AM 07/16/2022   11:23 AM 06/26/2022    8:06 AM 06/15/2022    9:49 AM 06/11/2022    2:09 PM  Depression screen PHQ 2/9  Decreased Interest 0 0 0 2 1 0 0  Down, Depressed, Hopeless 0 0 0 1 1 0 0  PHQ - 2 Score 0 0  0 3 2 0 0  Altered sleeping     3    Tired, decreased energy     3    Change in appetite     3    Feeling bad or failure about yourself      2    Trouble concentrating     0    Moving slowly or fidgety/restless     1    Suicidal thoughts     0    PHQ-9 Score     14    Difficult doing work/chores     Somewhat difficult       Review of Systems  Musculoskeletal:        A lot of shoulder pain lately  All other systems reviewed and are negative.      Objective:   Physical Exam  Awake, alert, appropriate, sitting in chair, NAD No assistive device Nails look like have fungus on L hand-  Hoarse voice Very TTP over bicipital tendon on LUE.  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 S/p L THR- 12/02/20  Getting AAA repair as of 1/23. Pt also had a  5th R finger fx- out of brace now.    Explained that Pain meds won't help shoulder pain much since they don't help inflammation.     2. Apply meds for nail fungus until it's completely grown out-  apply daily!   3.  Last oral drug screen 03/21/23-  and was appropriate- per clinic policy.    4. Will send in Oxycodone 10 mg #150- x2 months and MS Contin 15 mg 2x/day #60- send in 2 months.   5. Count might be 3-5 pills off per pt, because lost in heater vent Doesn't appear severe.    6. F/U in 2 months with Riley Lam and 4 months for me- for chronic pain.    I spent a total of  21  minutes on total care today- >50% coordination of care- due to  we discussed his L shoulder issues as well as pain  meds- decided not to change doses-

## 2023-05-15 NOTE — Telephone Encounter (Signed)
 He can have a repeat injection in the shoulder after May 28, 2023.

## 2023-05-15 NOTE — Telephone Encounter (Signed)
 Pt called requesting if he could get a steroid shot in his left shoulder. Pt states its in extreme pain. Pt would like to come as soon as possible. Pt is requesting a call back

## 2023-05-15 NOTE — Patient Instructions (Signed)
 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 S/p L THR- 12/02/20  Getting AAA repair as of 1/23. Pt also had a  5th R finger fx- out of brace now.    Explained that Pain meds won't help shoulder pain much since they don't help inflammation.     2. Apply meds for nail fungus until it's completely grown out-  apply daily!   3.  Last oral drug screen 03/21/23-  and was appropriate- per clinic policy.    4. Will send in Oxycodone 10 mg #150- x2 months and MS Contin 15 mg 2x/day #60- send in 2 months.   5. Count might be 3-5 pills off per pt, because lost in heater vent Doesn't appear severe.    6. F/U in 2 months with Riley Lam and 4 months for me- for chronic pain.

## 2023-05-15 NOTE — Telephone Encounter (Signed)
 Reached out to patient and scheduled patient for 05/31/2023.

## 2023-05-16 ENCOUNTER — Telehealth: Payer: Self-pay | Admitting: Registered Nurse

## 2023-05-16 ENCOUNTER — Telehealth (HOSPITAL_COMMUNITY): Payer: Self-pay | Admitting: Cardiology

## 2023-05-16 DIAGNOSIS — I5032 Chronic diastolic (congestive) heart failure: Secondary | ICD-10-CM

## 2023-05-16 MED ORDER — MORPHINE SULFATE ER 15 MG PO TBCR: 15.0000 mg | EXTENDED_RELEASE_TABLET | Freq: Two times a day (BID) | ORAL | 0 refills | Status: DC

## 2023-05-16 MED ORDER — CARVEDILOL 12.5 MG PO TABS: 12.5000 mg | ORAL_TABLET | Freq: Two times a day (BID) | ORAL | 3 refills | Status: DC

## 2023-05-16 NOTE — Telephone Encounter (Signed)
 Patient called to request refill on coreg Overdue fu scheduled

## 2023-05-16 NOTE — Telephone Encounter (Signed)
 Patient left vm his pharmacy is back ordered on morphine. Please send to [facility] [address], [place].

## 2023-05-17 ENCOUNTER — Other Ambulatory Visit: Payer: Self-pay | Admitting: *Deleted

## 2023-05-17 MED ORDER — PREDNISONE 5 MG PO TABS: ORAL_TABLET | ORAL | 0 refills | Status: DC

## 2023-05-17 NOTE — Telephone Encounter (Signed)
 Patient states he is needing a refill on Prednisone.  Last Fill: 03/07/2023  Next Visit: 06/20/2023  Last Visit: 03/07/2023  Dx: Polymyalgia rheumatica   Current Dose per office note on 03/07/2023: Prednisone 5 mg daily.   Okay to refill Prednisone?

## 2023-05-20 ENCOUNTER — Ambulatory Visit: Admitting: Rheumatology

## 2023-05-22 ENCOUNTER — Ambulatory Visit (INDEPENDENT_AMBULATORY_CARE_PROVIDER_SITE_OTHER): Payer: Medicare Other | Admitting: Physician Assistant

## 2023-05-22 ENCOUNTER — Ambulatory Visit (HOSPITAL_COMMUNITY)
Admission: RE | Admit: 2023-05-22 | Discharge: 2023-05-22 | Disposition: A | Discharge status: Home / Self Care | Payer: Medicare Other | Source: Ambulatory Visit | Attending: Vascular Surgery | Admitting: Vascular Surgery

## 2023-05-22 VITALS — BP 131/65 | HR 61 | Temp 98.2°F | Ht <= 58 in | Wt 144.5 lb

## 2023-05-22 DIAGNOSIS — I714 Abdominal aortic aneurysm, without rupture, unspecified: Secondary | ICD-10-CM

## 2023-05-22 NOTE — Progress Notes (Signed)
 Office Note   History of Present Illness   Brandon [name] is a 83 y.o. (1940/11/28) male who presents for follow up of AAA.  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 Randie Heinz.  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  He returns today for follow-up.  He says that he is doing fairly well.  He is having some issues with his rotator cuff on the left and is hopeful that a steroid injection later this month will help with his pain.  He denies any severe or new abdominal or back pain.  He does have chronic back pain with a history of 3 spine surgeries in the past.  Current Outpatient Medications  Medication Sig Dispense Refill   acetaminophen (TYLENOL) 500 MG tablet Take 1,000 mg by mouth every 8 (eight) hours as needed for mild pain or headache.     albuterol (VENTOLIN HFA) 108 (90 Base) MCG/ACT inhaler Inhale 2 puffs into the lungs every 6 (six) hours as needed for wheezing or shortness of breath. 6.7 g 2   allopurinol (ZYLOPRIM) 100 MG tablet TAKE 2 TABLETS(200 MG) BY MOUTH DAILY 180 tablet 0   amLODipine (NORVASC) 10 MG tablet TAKE 1 TABLET(10 MG) BY MOUTH DAILY 90 tablet 0   aspirin EC 81 MG tablet Take 81 mg by mouth at bedtime. Swallow whole.     atorvastatin (LIPITOR) 80 MG tablet TAKE 1 TABLET(80 MG) BY MOUTH DAILY 90 tablet 3   carvedilol (COREG) 12.5 MG tablet Take 1 tablet (12.5 mg total) by mouth 2 (two) times daily. 60 tablet 3   cyanocobalamin (VITAMIN B12) 1000 MCG tablet Take 1 tablet (1,000 mcg total) by mouth daily. 90 tablet 3   dorzolamide-timolol (COSOPT) 2-0.5 % ophthalmic solution Place 1 drop into the left eye 2 (two) times daily. 1 mL 12   ezetimibe (ZETIA) 10 MG tablet TAKE 1 TABLET(10 MG) BY MOUTH DAILY. NEED FOLLOW UP APPOINTMENT FOR MORE REFILLS 30 tablet 0   feeding supplement (BOOST HIGH PROTEIN) LIQD Take 1 Container by mouth 3 (three) times daily between meals.     FLUoxetine  (PROZAC) 20 MG capsule Take 1 capsule (20 mg total) by mouth daily. 90 capsule 3   fluticasone (FLONASE) 50 MCG/ACT nasal spray SHAKE LIQUID AND USE 2 SPRAYS IN EACH NOSTRIL DAILY AS NEEDED FOR RHINITIS OR ALLERGIES (Patient taking differently: Place 2 sprays into both nostrils daily.) 16 g 11   gabapentin (NEURONTIN) 300 MG capsule Take 1 capsule (300 mg total) by mouth 2 (two) times daily. 60 capsule 2   guaiFENesin (MUCINEX) 600 MG 12 hr tablet Take 600 mg by mouth 2 (two) times daily as needed for cough or to loosen phlegm.     hydrOXYzine (ATARAX) 10 MG tablet Take 1-2 tablets (10-20 mg total) by mouth at bedtime as needed for itching (or sleep). 30 tablet 3   ipratropium (ATROVENT) 0.02 % nebulizer solution Use 1 vial (0.5 mg total) by nebulization every 6 (six) hours as needed for wheezing or shortness of breath. 75 mL 12   morphine (MS CONTIN) 15 MG 12 hr tablet Take 1 tablet (15 mg total) by mouth every 12 (twelve) hours. Do Not Fill Before  06/11/23 60 tablet 0   morphine (MS CONTIN) 15 MG 12 hr tablet Take 1 tablet (15 mg total) by mouth every 12 (twelve) hours. 60 tablet 0   naloxone (NARCAN)  nasal spray 4 mg/0.1 mL For possible accidentally overdose. 2 each 5   oxyCODONE 10 MG TABS Take 1 tablet (10 mg total) by mouth every 4 (four) hours as needed for severe pain (pain score 7-10). 150 tablet 0   Oxycodone HCl 10 MG TABS Take 1 tablet (10 mg total) by mouth every 4 (four) hours as needed. Don't fill before 06/11/23 150 tablet 0   potassium chloride (KLOR-CON) 10 MEQ tablet Take 10 mEq by mouth 4 (four) times daily.     predniSONE (DELTASONE) 5 MG tablet TAKE 1 TABLET(5 MG) BY MOUTH DAILY WITH BREAKFAST 90 tablet 0   tamsulosin (FLOMAX) 0.4 MG CAPS capsule Take 0.4 mg by mouth See admin instructions. Take 0.4 mg by mouth one to two times a day     tamsulosin (FLOMAX) 0.4 MG CAPS capsule Take 1 capsule (0.4 mg total) by mouth daily. 30 capsule 3   Tiotropium Bromide Monohydrate (SPIRIVA  RESPIMAT) 2.5 MCG/ACT AERS Inhale 2 puffs into the lungs daily. 4 g 6   traZODone (DESYREL) 50 MG tablet TAKE 1/2 TO 1 TABLET(25 TO 50 MG) BY MOUTH AT BEDTIME AS NEEDED FOR SLEEP 30 tablet 3   triamcinolone cream (KENALOG) 0.1 % Apply 1 Application topically 2 (two) times daily as needed (to itchy sites).     trolamine salicylate (ASPERCREME) 10 % cream Apply 1 Application topically 2 (two) times daily as needed (arthritis pain).     ferrous sulfate 325 (65 FE) MG EC tablet Take 1 tablet (325 mg total) by mouth daily with breakfast. 30 tablet 0   pantoprazole (PROTONIX) 40 MG tablet Take 1 tablet (40 mg total) by mouth daily. (Patient taking differently: Take 40 mg by mouth daily as needed (for heartburn).) 30 tablet 2   No current facility-administered medications for this visit.    REVIEW OF SYSTEMS (negative unless checked):   Cardiac:  []  Chest pain or chest pressure? []  Shortness of breath upon activity? []  Shortness of breath when lying flat? []  Irregular heart rhythm?  Vascular:  []  Pain in calf, thigh, or hip brought on by walking? []  Pain in feet at night that wakes you up from your sleep? []  Blood clot in your veins? []  Leg swelling?  Pulmonary:  []  Oxygen at home? []  Productive cough? []  Wheezing?  Neurologic:  []  Sudden weakness in arms or legs? []  Sudden numbness in arms or legs? []  Sudden onset of difficult speaking or slurred speech? []  Temporary loss of vision in one eye? []  Problems with dizziness?  Gastrointestinal:  []  Blood in stool? []  Vomited blood?  Genitourinary:  []  Burning when urinating? []  Blood in urine?  Psychiatric:  []  Major depression  Hematologic:  []  Bleeding problems? []  Problems with blood clotting?  Dermatologic:  []  Rashes or ulcers?  Constitutional:  []  Fever or chills?  Ear/Nose/Throat:  []  Change in hearing? []  Nose bleeds? []  Sore throat?  Musculoskeletal:  [x]  Back pain? [x]  Joint pain? []  Muscle  pain?   Physical Examination   Vitals:   05/22/23 1043  BP: 131/65  Pulse: 61  Temp: 98.2 F (36.8 C)  TempSrc: Temporal  SpO2: 94%  Weight: 144 lb 8 oz (65.5 kg)  Height: 4\' 10"  (1.473 m)   Body mass index is 30.2 kg/m.  General:  WDWN in NAD; vital signs documented above Gait: Not observed HENT: WNL, normocephalic Pulmonary: normal non-labored breathing , without rales, rhonchi,  wheezing Cardiac: Regular Abdomen: soft, NT, no masses Skin: without rashes Vascular  Exam/Pulses: 2+ PT pulses bilaterally Extremities: without ischemic changes, without gangrene , without cellulitis; without open wounds;  Musculoskeletal: no muscle wasting or atrophy  Neurologic: A&O X 3;  No focal weakness or paresthesias are detected Psychiatric:  The pt has Normal affect.   Non-Invasive Vascular Imaging   AAA Duplex (05/22/2023) Current size: 5.0 cm Previous size: 5.04 cm (04/2022) R CIA: 1.42 cm L CIA: 1.36 cm   Medical Decision Making   TARYLL REICHENBERGER is a 83 y.o. (1941-01-22) male who presents for surveillance of AAA  Based on this patient's duplex, his stent graft repair is patent without endoleak.  His AAA remains about the same size at 5 cm Duplex also demonstrates patent bilateral iliac limb stents without stenosis He denies any severe or new abdominal or back pain.  He also denies any lower extremity claudication or rest pain.  On exam he has 2+ PT pulses bilaterally He can follow-up with our office in 1 year with repeat EVAR duplex  Deneise Finlay PA-C [facility] Office: [phone]  Call MD: Rosalva Comber

## 2023-05-28 ENCOUNTER — Ambulatory Visit: Attending: Rheumatology | Admitting: Rheumatology

## 2023-05-28 ENCOUNTER — Encounter: Payer: Self-pay | Admitting: Rheumatology

## 2023-05-28 VITALS — BP 129/64 | HR 58 | Resp 14 | Ht 68.0 in | Wt 143.0 lb

## 2023-05-28 DIAGNOSIS — G894 Chronic pain syndrome: Secondary | ICD-10-CM

## 2023-05-28 DIAGNOSIS — M810 Age-related osteoporosis without current pathological fracture: Secondary | ICD-10-CM

## 2023-05-28 DIAGNOSIS — Z96642 Presence of left artificial hip joint: Secondary | ICD-10-CM

## 2023-05-28 DIAGNOSIS — G8929 Other chronic pain: Secondary | ICD-10-CM

## 2023-05-28 DIAGNOSIS — Z952 Presence of prosthetic heart valve: Secondary | ICD-10-CM

## 2023-05-28 DIAGNOSIS — M19041 Primary osteoarthritis, right hand: Secondary | ICD-10-CM

## 2023-05-28 DIAGNOSIS — M25512 Pain in left shoulder: Secondary | ICD-10-CM | POA: Diagnosis not present

## 2023-05-28 DIAGNOSIS — M1A0210 Idiopathic chronic gout, right elbow, without tophus (tophi): Secondary | ICD-10-CM

## 2023-05-28 DIAGNOSIS — M19042 Primary osteoarthritis, left hand: Secondary | ICD-10-CM

## 2023-05-28 DIAGNOSIS — I10 Essential (primary) hypertension: Secondary | ICD-10-CM

## 2023-05-28 DIAGNOSIS — Z8639 Personal history of other endocrine, nutritional and metabolic disease: Secondary | ICD-10-CM

## 2023-05-28 DIAGNOSIS — Z8719 Personal history of other diseases of the digestive system: Secondary | ICD-10-CM

## 2023-05-28 DIAGNOSIS — M353 Polymyalgia rheumatica: Secondary | ICD-10-CM | POA: Diagnosis not present

## 2023-05-28 DIAGNOSIS — Z7952 Long term (current) use of systemic steroids: Secondary | ICD-10-CM | POA: Diagnosis not present

## 2023-05-28 DIAGNOSIS — R1312 Dysphagia, oropharyngeal phase: Secondary | ICD-10-CM

## 2023-05-28 DIAGNOSIS — Z87891 Personal history of nicotine dependence: Secondary | ICD-10-CM

## 2023-05-28 DIAGNOSIS — M51369 Other intervertebral disc degeneration, lumbar region without mention of lumbar back pain or lower extremity pain: Secondary | ICD-10-CM

## 2023-05-28 DIAGNOSIS — E559 Vitamin D deficiency, unspecified: Secondary | ICD-10-CM

## 2023-05-28 DIAGNOSIS — C090 Malignant neoplasm of tonsillar fossa: Secondary | ICD-10-CM

## 2023-05-28 DIAGNOSIS — F419 Anxiety disorder, unspecified: Secondary | ICD-10-CM

## 2023-05-28 DIAGNOSIS — I7143 Infrarenal abdominal aortic aneurysm, without rupture: Secondary | ICD-10-CM

## 2023-05-28 DIAGNOSIS — L409 Psoriasis, unspecified: Secondary | ICD-10-CM

## 2023-05-28 DIAGNOSIS — Z862 Personal history of diseases of the blood and blood-forming organs and certain disorders involving the immune mechanism: Secondary | ICD-10-CM

## 2023-05-28 DIAGNOSIS — F32A Depression, unspecified: Secondary | ICD-10-CM

## 2023-05-28 MED ORDER — LIDOCAINE HCL 1 % IJ SOLN
1.5000 mL | INTRAMUSCULAR | Status: AC | PRN
  Administered 2023-05-28: 1.5 mL

## 2023-05-28 MED ORDER — TRIAMCINOLONE ACETONIDE 40 MG/ML IJ SUSP
40.0000 mg | INTRAMUSCULAR | Status: AC | PRN
  Administered 2023-05-28: 40 mg via INTRA_ARTICULAR

## 2023-05-28 MED ORDER — ALLOPURINOL 100 MG PO TABS: 200.0000 mg | ORAL_TABLET | Freq: Every day | ORAL | 1 refills | Status: DC

## 2023-05-28 NOTE — Progress Notes (Signed)
 Office Visit Note  Patient: Brandon [name]             Date of Birth: 21-Apr-1940           MRN: [id number]             PCP: Graig Lawyer, MD Referring: Graig Lawyer, MD Visit Date: 05/28/2023 Occupation: @GUAROCC @  Subjective:  Left shoulder pain  History of Present Illness: Brandon [name] is a 83 y.o. male with polymyalgia rheumatica, osteoarthritis and gouty arthropathy.  He returns today after his last visit in January 2025.  He states he had left shoulder joint injection by orthopedics in January and had no response to it.  He continues to have pain and discomfort in his left shoulder.  He has difficulty raising his arm.  He states his left shoulder is frozen.  He increased the dose of prednisone  to 10 mg p.o. daily for few days and then decrease it back to 5 mg daily.  He continues to have some stiffness in his hands.  He denies any discomfort in his left hip.  He had a lumbar spine injection by Dr. Rexanne Catalina in February 2025 which helped.  He denies any psoriasis flares.  He denies having a gout flare.  He has been taking allopurinol  on a regular basis and requested a refill of allopurinol  today.    Activities of Daily Living:  Patient reports morning stiffness for 4-5 hours.   Patient Reports nocturnal pain.  Difficulty dressing/grooming: Reports Difficulty climbing stairs: Reports Difficulty getting out of chair: Denies Difficulty using hands for taps, buttons, cutlery, and/or writing: Reports  Review of Systems  Constitutional:  Positive for fatigue.  HENT:  Positive for mouth dryness. Negative for mouth sores.   Eyes:  Negative for dryness.  Respiratory:  Negative for shortness of breath.   Cardiovascular:  Negative for chest pain and palpitations.  Gastrointestinal:  Negative for blood in stool, constipation and diarrhea.  Endocrine: Positive for increased urination.  Genitourinary:  Negative for involuntary urination.  Musculoskeletal:  Positive for joint pain, joint  pain, joint swelling, myalgias, muscle weakness, morning stiffness, muscle tenderness and myalgias. Negative for gait problem.  Skin:  Negative for color change, rash, hair loss and sensitivity to sunlight.  Allergic/Immunologic: Negative for susceptible to infections.  Neurological:  Negative for dizziness and headaches.  Hematological:  Negative for swollen glands.  Psychiatric/Behavioral:  Positive for sleep disturbance. Negative for depressed mood. The patient is not nervous/anxious.     PMFS History:  Patient Active Problem List   Diagnosis Date Noted   Primary osteoarthritis, left shoulder 02/27/2023   Anemia of chronic disease 02/11/2023   Hyperglycemia 02/11/2023   Vitamin B12 deficiency 02/11/2023   Age related osteoporosis 08/31/2022   Malnutrition of mild degree (HCC) 08/01/2022   Insomnia 06/26/2022   Arthritis of left hand 06/26/2022   Weight loss 06/26/2022   Chronic gout of multiple sites 04/30/2022   Chronically on opiate therapy 02/12/2022   Sacroiliac joint pain 02/12/2022   At risk for polypharmacy 01/19/2022   Trochanteric bursitis of left hip 01/02/2022   Dysfunction of left eustachian tube 12/05/2021   Hypotension 08/18/2021   Hyponatremia 06/26/2021   Hyperkalemia 06/26/2021   Acute metabolic encephalopathy 06/26/2021   Myoclonic jerking 06/26/2021   Lumbar radiculopathy 06/08/2021   Lumbar postlaminectomy syndrome 05/03/2021   Degeneration of lumbar intervertebral disc 05/03/2021   Lumbar spondylosis 05/03/2021   History of hip fracture 04/24/2021   S/P abdominal aortic  aneurysm repair 04/24/2021   Chronic allergic rhinitis 01/24/2021   Demand ischemia (HCC)    Elevated troponin    Chronic diastolic heart failure (HCC) 12/03/2020   Steatosis of liver 11/01/2020   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   Hepatitis B core antibody positive 11/01/2020   Benign prostatic hyperplasia 11/01/2020   Chronic obstructive pulmonary disease (HCC) 07/20/2020    History of placement of stent in LAD coronary artery 07/20/2020   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   TIA (transient ischemic attack)    Esophageal stricture    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   Primary osteoarthritis of both hands 01/11/2020   Piriformis syndrome of both sides 09/23/2019   Sciatica associated with disorder of lumbar spine 04/24/2019   Primary osteoarthritis involving multiple joints 04/24/2019   Chronic pain syndrome 04/24/2019   Mixed anxiety and depressive disorder 04/15/2018   Oropharyngeal dysphagia 03/11/2018   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   Tobacco abuse disorder 01/10/2014   Essential hypertension 05/19/2013   Psoriasis 01/13/2009   Coronary artery disease 09/15/2008   Diverticulosis of colon 08/27/2007   History of benign prostatic hyperplasia 08/27/2007   Hyperlipidemia 03/19/2007   Gastroesophageal reflux disease 03/19/2007    Past Medical History:  Diagnosis Date   Abdominal aneurysm (HCC)    Arthritis    "all over" (07/19/2016)   BPH (benign prostatic hypertrophy)    CAD (coronary artery disease)    Chicken pox    Chronic lower back pain    s/p surgical fusion   Depression    Diverticulosis    Esophageal stricture    GERD (gastroesophageal reflux disease)    Hepatitis B 1984   Hiatal hernia    History of radiation therapy 01/16/18- 03/05/18   Left Tonsil, 66 Gy in 33 fractions to high risk nodal echelons.    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 Arlester Ladd and Dr Sherene Dilling    Severe aortic stenosis    TIA (transient ischemic attack) 1990s   hx   tonsillar ca dx'd 11/2017   Tubular adenoma of colon 2009    Family History  Problem Relation Age of Onset   Heart disease Father 75       Living    Coronary artery disease Father        CABG   Alzheimer's disease Mother 33       Deceased   Arthritis Mother    Aneurysm Brother    Stomach cancer Maternal Uncle    Brain cancer Maternal Aunt        x2   Obesity Daughter        Had Bypass Sx   Past Surgical History:  Procedure Laterality Date   ABDOMINAL AORTIC ENDOVASCULAR STENT GRAFT N/A 03/03/2021   Procedure: ABDOMINAL AORTIC ENDOVASCULAR STENT GRAFT;  Surgeon: Adine Hoof, MD;  Location: [facility] OR;  Service: Vascular;  Laterality: N/A;   ABDOMINAL AORTOGRAM W/LOWER EXTREMITY N/A 04/10/2021   Procedure: ABDOMINAL AORTOGRAM W/LOWER EXTREMITY;  Surgeon: Adine Hoof, MD;  Location: [facility] INVASIVE CV LAB;  Service: Cardiovascular;  Laterality: N/A;   BACK SURGERY     CARDIAC CATHETERIZATION     CATARACT EXTRACTION W/ INTRAOCULAR LENS  IMPLANT, BILATERAL Bilateral 01/2012 - 02/2012   CORONARY ATHERECTOMY N/A  04/27/2020   Procedure: CORONARY ATHERECTOMY;  Surgeon: Arnoldo Lapping, MD;  Location: [facility] INVASIVE CV LAB;  Service: Cardiovascular;  Laterality: N/A;   CORONARY IMAGING/OCT N/A 04/27/2020   Procedure: INTRAVASCULAR IMAGING/OCT;  Surgeon: Arnoldo Lapping, MD;  Location: [facility] INVASIVE CV LAB;  Service: Cardiovascular;  Laterality: N/A;   CORONARY PRESSURE/FFR STUDY N/A 04/27/2020   Procedure: INTRAVASCULAR PRESSURE WIRE/FFR STUDY;  Surgeon: Arnoldo Lapping, MD;  Location: [facility] INVASIVE CV LAB;  Service: Cardiovascular;  Laterality: N/A;   CORONARY STENT INTERVENTION N/A 04/27/2020   Procedure: CORONARY STENT INTERVENTION;  Surgeon: Arnoldo Lapping, MD;  Location: [facility] INVASIVE CV LAB;  Service: Cardiovascular;  Laterality: N/A;   DIRECT LARYNGOSCOPY Left 10/09/2017   Procedure: DIRECT LARYNGOSCOPY WITH BOPSY;  Surgeon: Lenton Rail, MD;  Location: [facility];  Service: ENT;  Laterality: Left;   ESOPHAGOGASTRODUODENOSCOPY (EGD) WITH ESOPHAGEAL DILATION     "couple times" (07/19/2016)   ESOPHAGOSCOPY Left  10/09/2017   Procedure: ESOPHAGOSCOPY;  Surgeon: Lenton Rail, MD;  Location: [facility];  Service: ENT;  Laterality: Left;   IR GASTROSTOMY TUBE MOD SED  01/08/2018   IR THORACENTESIS ASP PLEURAL SPACE W/IMG GUIDE  07/19/2016   LAPAROSCOPIC CHOLECYSTECTOMY  1984   LUMBAR DISC SURGERY  05/1996   L4-5; Dr. Antonina Kleine LAMINECTOMY/DECOMPRESSION MICRODISCECTOMY  10/2002   L3-4. Dr. Cipriano Creeks   MULTIPLE TOOTH EXTRACTIONS  1980s   PARTIAL GLOSSECTOMY  12/02/2017   Dr. Caldwell Castles- [facility]   pharyngoplasty for closure of tingue base defect  12/02/2017   Dr. Caldwell Castles- [facility]   PICC LINE INSERTION  07/15/2016   POSTERIOR LUMBAR FUSION  09/1996   Ray cage, L4-5 Dr. Lawson Prey   PROSTATE BIOPSY  ~ 2017   radical tonsillectomy Left 12/02/2017   Dr. Caldwell Castles at [facility]   RIGHT HEART CATH AND CORONARY ANGIOGRAPHY N/A 03/31/2020   Procedure: RIGHT HEART CATH AND CORONARY ANGIOGRAPHY;  Surgeon: Darlis Eisenmenger, MD;  Location: [facility] INVASIVE CV LAB;  Service: Cardiovascular;  Laterality: N/A;   RIGID BRONCHOSCOPY Left 10/09/2017   Procedure: RIGID BRONCHOSCOPY;  Surgeon: Lenton Rail, MD;  Location: [facility];  Service: ENT;  Laterality: Left;   TEE WITHOUT CARDIOVERSION N/A 06/07/2020   Procedure: TRANSESOPHAGEAL ECHOCARDIOGRAM (TEE);  Surgeon: Arnoldo Lapping, MD;  Location: [facility] OR;  Service: Open Heart Surgery;  Laterality: N/A;   TONSILLECTOMY     TOTAL HIP ARTHROPLASTY Left 12/04/2020   Procedure: TOTAL HIP ARTHROPLASTY ANTERIOR APPROACH;  Surgeon: Adonica Hoose, MD;  Location: [facility] ORS;  Service: Orthopedics;  Laterality: Left;   TOTAL HIP ARTHROPLASTY Left 11/2020   TRACHEOSTOMY  12/02/2017   Dr. Caldwell Castles- [facility]   ULTRASOUND GUIDANCE FOR VASCULAR ACCESS Right 06/07/2020   Procedure: ULTRASOUND GUIDANCE FOR VASCULAR ACCESS;  Surgeon: Arnoldo Lapping, MD;  Location: [facility] OR;  Service: Open Heart Surgery;  Laterality: Right;   ULTRASOUND GUIDANCE FOR VASCULAR ACCESS  Bilateral 03/03/2021   Procedure: ULTRASOUND GUIDANCE FOR VASCULAR ACCESS, BILATERAL FEMORAL ARTERIES;  Surgeon: Adine Hoof, MD;  Location: [facility] OR;  Service: Vascular;  Laterality: Bilateral;   VASCULAR SURGERY     Social History   Social History Narrative   ** Merged History Encounter **       Married (3rd), [place]. 2 children from 1st marriage, 4 step children.    Retired on disability due to back    Former Runner, broadcasting/film/video.   restores antique furniture for a hobby.       Cell # [phone]  Immunization History  Administered Date(s) Administered   Fluad Quad(high Dose 65+) 10/15/2018, 11/06/2019, 10/20/2020   Influenza Split 11/26/2010, 10/11/2011, 11/03/2012   Influenza Whole 11/14/2006, 12/04/2007, 10/06/2008, 10/25/2009, 11/26/2021   Influenza, High Dose Seasonal PF 11/01/2016, 10/30/2017   Influenza, Seasonal, Injecte, Preservative Fre 12/07/2014   Influenza,inj,Quad PF,6+ Mos 11/04/2015   Influenza-Unspecified 11/19/2013, 11/06/2022   PFIZER(Purple Top)SARS-COV-2 Vaccination 02/25/2019, 03/18/2019, 11/16/2019   Pneumococcal Conjugate-13 12/07/2014   Pneumococcal Polysaccharide-23 05/30/2017   Tdap 01/28/2020     Objective: Vital Signs: BP 129/64 (BP Location: Right Arm, Patient Position: Sitting, Cuff Size: Normal)   Pulse (!) 58   Resp 14   Ht 5\' 8"  (1.727 m)   Wt 143 lb (64.9 kg)   BMI 21.74 kg/m    Physical Exam Vitals and nursing note reviewed.  Constitutional:      Appearance: He is well-developed.  HENT:     Head: Normocephalic and atraumatic.  Eyes:     Conjunctiva/sclera: Conjunctivae normal.     Pupils: Pupils are equal, round, and reactive to light.  Cardiovascular:     Rate and Rhythm: Normal rate and regular rhythm.     Heart sounds: Normal heart sounds.  Pulmonary:     Effort: Pulmonary effort is normal.     Breath sounds: Normal breath sounds.  Abdominal:     General: Bowel sounds are normal.     Palpations:  Abdomen is soft.  Musculoskeletal:     Cervical back: Normal range of motion and neck supple.  Skin:    General: Skin is warm and dry.     Capillary Refill: Capillary refill takes less than 2 seconds.  Neurological:     Mental Status: He is alert and oriented to person, place, and time.  Psychiatric:        Behavior: Behavior normal.      Musculoskeletal Exam: Limited lateral rotation, flexion extension of the cervical spine was noted.  Thoracic kyphosis was noted.  He had painful limited range of motion of the lumbar spine.  Right shoulder joint was in full range of motion.  The forward flexion and abduction of the left shoulder is limited to about 30 degrees.  He had less discomfort with internal rotation.  Elbow joints and wrist joints in good range of motion.  Bilateral PIP and DIP thickening with no synovitis was noted.  He had limited range of motion of his hip joints and left hip joint is replaced.  Knee joints with good range of motion without any warmth swelling or effusion.  There was no tenderness over ankles or MTPs.  CDAI Exam: CDAI Score: -- Patient Global: --; Provider Global: -- Swollen: --; Tender: -- Joint Exam 05/28/2023   No joint exam has been documented for this visit   There is currently no information documented on the homunculus. Go to the Rheumatology activity and complete the homunculus joint exam.  Investigation: No additional findings.  Imaging: VAS US  EVAR DUPLEX Result Date: 05/23/2023 Endovascular Aortic Repair Study (EVAR) Patient Name:  SEVERINO PAOLO  Date of Exam:   05/22/2023 Medical Rec #: [id number]     Accession #:    [id number] Date of Birth: 10/26/1940     Patient Gender: M Patient Age:   63 years Exam Location:  [facility] Procedure:      VAS US  EVAR DUPLEX Referring Phys: Angela Kell --------------------------------------------------------------------------------  Indications: Follow up exam for EVAR. Risk Factors: Hypertension,  hyperlipidemia, coronary artery disease. Vascular Interventions: Bilateral common  iliac stents 04/10/2021. Limitations: Air/bowel gas.  Performing Technologist: Homer Lust RVT  Examination Guidelines: A complete evaluation includes B-mode imaging, spectral Doppler, color Doppler, and power Doppler as needed of all accessible portions of each vessel. Bilateral testing is considered an integral part of a complete examination. Limited examinations for reoccurring indications may be performed as noted.  Endovascular Aortic Repair (EVAR): +----------+----------------+-------------------+-------------------+           Diameter AP (cm)Diameter Trans (cm)Velocities (cm/sec) +----------+----------------+-------------------+-------------------+ Aorta     4.80            5.00               82                  +----------+----------------+-------------------+-------------------+ Right Limb1.42            1.30               115                 +----------+----------------+-------------------+-------------------+ Left Limb 1.36            1.30               129                 +----------+----------------+-------------------+-------------------+ +-------------+---------------+ Endoleak TypeNot visualized. +-------------+---------------+  Summary: Abdominal Aorta: The largest aortic diameter remains essentially unchanged compared to prior exam. Previous diameter measurement was 4.9 x 5.04 cm obtained on 05/03/2022. Bilateral common iliac stents appear patent.  *See table(s) above for measurements and observations.  Electronically signed by Irvin Mantel on 05/23/2023 at 5:53:10 PM.    Final     Recent Labs: Lab Results  Component Value Date   WBC 7.0 01/24/2023   HGB 11.6 (L) 01/24/2023   PLT 112 (L) 01/24/2023   NA 139 01/24/2023   K 3.7 01/24/2023   CL 101 01/24/2023   CO2 31 01/24/2023   GLUCOSE 166 (H) 01/24/2023   BUN 12 01/24/2023   CREATININE 0.66 (L) 01/24/2023   BILITOT 0.8 01/24/2023    ALKPHOS 127 (H) 06/26/2022   AST 9 (L) 01/24/2023   ALT 6 (L) 01/24/2023   PROT 5.5 (L) 01/24/2023   ALBUMIN  3.4 (L) 06/26/2022   CALCIUM  8.1 (L) 01/24/2023   GFRAA 107 08/11/2020   QFTBGOLDPLUS NEGATIVE 01/11/2020    Speciality Comments: Fosamax  started on January 26, 2021.  If he has dysphagia with Fosamax  or any GI intolerance we will have to switch him to Reclast .  Procedures:  Large Joint Inj: L glenohumeral on 05/28/2023 8:39 AM Indications: pain Details: 27 G 1.5 in needle, anterior approach  Arthrogram: No  Medications: 1.5 mL lidocaine  1 %; 40 mg triamcinolone  acetonide 40 MG/ML Aspirate: 0 mL Outcome: tolerated well, no immediate complications Procedure, treatment alternatives, risks and benefits explained, specific risks discussed. Consent was given by the patient. Immediately prior to procedure a time out was called to verify the correct patient, procedure, equipment, support staff and site/side marked as required. Patient was prepped and draped in the usual sterile fashion.     Allergies: Tape and Celebrex [celecoxib]   Assessment / Plan:     Visit Diagnoses: Polymyalgia rheumatica (HCC)-patient gives history of no increased muscular weakness or tenderness of the proximal muscles.  Although he remains on prednisone  5 mg a day and does not want to taper it.  Long term (current) use of systemic steroids-he has been on prednisone  5 mg daily.  Side effects  of long-term use of prednisone  include increasing blood pressure, diabetes, weight gain, osteoporosis, thinning of the skin, cataracts and heart disease were again revised.  Patient does not want to taper prednisone .  Chronic left shoulder pain-he has been in pain and discomfort in his left shoulder for the last several months.  He had an adequate response to cortisone injection in January.  He requested a cortisone injection in the office today.  After informed consent was obtained and side effects including the risk  of infection, tendon and nerve injury, dermal atrophy and hypopigmentation were discussed left shoulder joint was injected with lidocaine  and Kenalog  as described above.  Patient tolerated the procedure well.  He noticed some immediate relief after the injection and range of motion improved.  I also gave him a handout on shoulder joint exercises.  I offered physical therapy but she declined.  If he has ongoing symptoms after the injection then we may have to consider MRI.  Primary osteoarthritis of both hands-he is aware PIP and DIP thickening associated with the osteoarthritis.  Joint protection was discussed.  Status post total hip replacement, left-October 2022.  He had limited range of motion without discomfort.  Degeneration of intervertebral disc of lumbar region without discogenic back pain or lower extremity pain-patient notes having lumbar spine injection by Dr. Coley Davis in February and currently is not having much discomfort.  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  He had MSU and CPPD crystals aspirated in the past.  Uric acid is in the desirable range.  He has been taking allopurinol  200 mg daily.  Age-related osteoporosis without current pathological fracture-DEXA scan on July 27, 2022 showed right femoral neck BMD 0.558 with T-score of -2.7, -20% change in BMD for the right total femur.  He had Reclast  infusion in August 2024.  I plan to repeat Reclast  infusion in August 2025.  Vitamin D  deficiency-he has been taking vitamin D .  Other medical problems are listed as follows:  Chronic pain syndrome  Psoriasis  Aneurysm of infrarenal abdominal aorta, unspecified whether ruptured (HCC)  History of aortic valve replacement  Oropharyngeal dysphagia  History of gastroesophageal reflux (GERD)  History of hyperlipidemia  Anxiety and depression  Essential hypertension  Cancer of tonsillar fossa (HCC)  Former smoker  History of  iron  deficiency anemia  Orders: Orders Placed This Encounter  Procedures   Large Joint Inj   Meds ordered this encounter  Medications   allopurinol  (ZYLOPRIM ) 100 MG tablet    Sig: Take 2 tablets (200 mg total) by mouth daily.    Dispense:  180 tablet    Refill:  1     Follow-Up Instructions: Return in about 4 months (around 09/27/2023) for Osteoarthritis, Gout.   Nicholas Bari, MD  Note - This record has been created using Animal nutritionist.  Chart creation errors have been sought, but may not always  have been located. Such creation errors do not reflect on  the standard of medical care.

## 2023-05-28 NOTE — Patient Instructions (Addendum)
 Please return for lab work in June.  Shoulder Exercises Ask your health care provider which exercises are safe for you. Do exercises exactly as told by your health care provider and adjust them as directed. It is normal to feel mild stretching, pulling, tightness, or discomfort as you do these exercises. Stop right away if you feel sudden pain or your pain gets worse. Do not begin these exercises until told by your health care provider. Stretching exercises External rotation and abduction This exercise is sometimes called corner stretch. The exercise rotates your arm outward (external rotation) and moves your arm out from your body (abduction). Stand in a doorway with one of your feet slightly in front of the other. This is called a staggered stance. If you cannot reach your forearms to the door frame, stand facing a corner of a room. Choose one of the following positions as told by your health care provider: Place your hands and forearms on the door frame above your head. Place your hands and forearms on the door frame at the height of your head. Place your hands on the door frame at the height of your elbows. Slowly move your weight onto your front foot until you feel a stretch across your chest and in the front of your shoulders. Keep your head and chest upright and keep your abdominal muscles tight. Hold for __________ seconds. To release the stretch, shift your weight to your back foot. Repeat __________ times. Complete this exercise __________ times a day. Extension, standing  Stand and hold a broomstick, a cane, or a similar object behind your back. Your hands should be a little wider than shoulder-width apart. Your palms should face away from your back. Keeping your elbows straight and your shoulder muscles relaxed, move the stick away from your body until you feel a stretch in your shoulders (extension). Avoid shrugging your shoulders while you move the stick. Keep your shoulder blades  tucked down toward the middle of your back. Hold for __________ seconds. Slowly return to the starting position. Repeat __________ times. Complete this exercise __________ times a day. Range-of-motion exercises Pendulum  Stand near a wall or a surface that you can hold onto for balance. Bend at the waist and let your left / right arm hang straight down. Use your other arm to support you. Keep your back straight and do not lock your knees. Relax your left / right arm and shoulder muscles, and move your hips and your trunk so your left / right arm swings freely. Your arm should swing because of the motion of your body, not because you are using your arm or shoulder muscles. Keep moving your hips and trunk so your arm swings in the following directions, as told by your health care provider: Side to side. Forward and backward. In clockwise and counterclockwise circles. Continue each motion for __________ seconds, or for as long as told by your health care provider. Slowly return to the starting position. Repeat __________ times. Complete this exercise __________ times a day. Shoulder flexion, standing  Stand and hold a broomstick, a cane, or a similar object. Place your hands a little more than shoulder-width apart on the object. Your left / right hand should be palm-up, and your other hand should be palm-down. Keep your elbow straight and your shoulder muscles relaxed. Push the stick up with your healthy arm to raise your left / right arm in front of your body, and then over your head until you feel a stretch  in your shoulder (flexion). Avoid shrugging your shoulder while you raise your arm. Keep your shoulder blade tucked down toward the middle of your back. Hold for __________ seconds. Slowly return to the starting position. Repeat __________ times. Complete this exercise __________ times a day. Shoulder abduction, standing  Stand and hold a broomstick, a cane, or a similar object. Place  your hands a little more than shoulder-width apart on the object. Your left / right hand should be palm-up, and your other hand should be palm-down. Keep your elbow straight and your shoulder muscles relaxed. Push the object across your body toward your left / right side. Raise your left / right arm to the side of your body (abduction) until you feel a stretch in your shoulder. Do not raise your arm above shoulder height unless your health care provider tells you to do that. If directed, raise your arm over your head. Avoid shrugging your shoulder while you raise your arm. Keep your shoulder blade tucked down toward the middle of your back. Hold for __________ seconds. Slowly return to the starting position. Repeat __________ times. Complete this exercise __________ times a day. Internal rotation  Place your left / right hand behind your back, palm-up. Use your other hand to dangle an exercise band, a broomstick, or a similar object over your shoulder. Grasp the band with your left / right hand so you are holding on to both ends. Gently pull up on the band until you feel a stretch in the front of your left / right shoulder. The movement of your arm toward the center of your body is called internal rotation. Avoid shrugging your shoulder while you raise your arm. Keep your shoulder blade tucked down toward the middle of your back. Hold for __________ seconds. Release the stretch by letting go of the band and lowering your hands. Repeat __________ times. Complete this exercise __________ times a day. Strengthening exercises External rotation  Sit in a stable chair without armrests. Secure an exercise band to a stable object at elbow height on your left / right side. Place a soft object, such as a folded towel or a small pillow, between your left / right upper arm and your body to move your elbow about 4 inches (10 cm) away from your side. Hold the end of the exercise band so it is tight and  there is no slack. Keeping your elbow pressed against the soft object, slowly move your forearm out, away from your abdomen (external rotation). Keep your body steady so only your forearm moves. Hold for __________ seconds. Slowly return to the starting position. Repeat __________ times. Complete this exercise __________ times a day. Shoulder abduction  Sit in a stable chair without armrests, or stand up. Hold a __________ lb / kg weight in your left / right hand, or hold an exercise band with both hands. Start with your arms straight down and your left / right palm facing in, toward your body. Slowly lift your left / right hand out to your side (abduction). Do not lift your hand above shoulder height unless your health care provider tells you that this is safe. Keep your arms straight. Avoid shrugging your shoulder while you do this movement. Keep your shoulder blade tucked down toward the middle of your back. Hold for __________ seconds. Slowly lower your arm, and return to the starting position. Repeat __________ times. Complete this exercise __________ times a day. Shoulder extension  Sit in a stable chair without armrests, or  stand up. Secure an exercise band to a stable object in front of you so it is at shoulder height. Hold one end of the exercise band in each hand. Straighten your elbows and lift your hands up to shoulder height. Squeeze your shoulder blades together as you pull your hands down to the sides of your thighs (extension). Stop when your hands are straight down by your sides. Do not let your hands go behind your body. Hold for __________ seconds. Slowly return to the starting position. Repeat __________ times. Complete this exercise __________ times a day. Shoulder row  Sit in a stable chair without armrests, or stand up. Secure an exercise band to a stable object in front of you so it is at chest height. Hold one end of the exercise band in each hand. Position your  palms so that your thumbs are facing the ceiling (neutral position). Bend each of your elbows to a 90-degree angle (right angle) and keep your upper arms at your sides. Step back or move the chair back until the band is tight and there is no slack. Slowly pull your elbows back behind you. Hold for __________ seconds. Slowly return to the starting position. Repeat __________ times. Complete this exercise __________ times a day. Shoulder press-ups  Sit in a stable chair that has armrests. Sit upright, with your feet flat on the floor. Put your hands on the armrests so your elbows are bent and your fingers are pointing forward. Your hands should be about even with the sides of your body. Push down on the armrests and use your arms to lift yourself off the chair. Straighten your elbows and lift yourself up as much as you comfortably can. Move your shoulder blades down, and avoid letting your shoulders move up toward your ears. Keep your feet on the ground. As you get stronger, your feet should support less of your body weight as you lift yourself up. Hold for __________ seconds. Slowly lower yourself back into the chair. Repeat __________ times. Complete this exercise __________ times a day. Wall push-ups  Stand so you are facing a stable wall. Your feet should be about one arm-length away from the wall. Lean forward and place your palms on the wall at shoulder height. Keep your feet flat on the floor as you bend your elbows and lean forward toward the wall. Hold for __________ seconds. Straighten your elbows to push yourself back to the starting position. Repeat __________ times. Complete this exercise __________ times a day. This information is not intended to replace advice given to you by your health care provider. Make sure you discuss any questions you have with your health care provider. Document Revised: 03/14/2021 Document Reviewed: 03/14/2021 Elsevier Patient Education  2024  [employer].

## 2023-05-31 ENCOUNTER — Ambulatory Visit: Admitting: Rheumatology

## 2023-06-12 ENCOUNTER — Encounter (HOSPITAL_COMMUNITY): Payer: Self-pay

## 2023-06-12 ENCOUNTER — Ambulatory Visit (HOSPITAL_COMMUNITY)
Admission: RE | Admit: 2023-06-12 | Discharge: 2023-06-12 | Disposition: A | Discharge status: Home / Self Care | Source: Ambulatory Visit | Attending: Family Medicine | Admitting: Family Medicine

## 2023-06-12 VITALS — BP 118/62 | HR 69 | Ht 68.0 in | Wt 140.2 lb

## 2023-06-12 DIAGNOSIS — I5032 Chronic diastolic (congestive) heart failure: Secondary | ICD-10-CM | POA: Insufficient documentation

## 2023-06-12 DIAGNOSIS — E785 Hyperlipidemia, unspecified: Secondary | ICD-10-CM | POA: Insufficient documentation

## 2023-06-12 DIAGNOSIS — I6523 Occlusion and stenosis of bilateral carotid arteries: Secondary | ICD-10-CM | POA: Insufficient documentation

## 2023-06-12 DIAGNOSIS — Z9889 Other specified postprocedural states: Secondary | ICD-10-CM | POA: Insufficient documentation

## 2023-06-12 DIAGNOSIS — I110 Hypertensive heart disease with heart failure: Secondary | ICD-10-CM | POA: Diagnosis not present

## 2023-06-12 DIAGNOSIS — I2510 Atherosclerotic heart disease of native coronary artery without angina pectoris: Secondary | ICD-10-CM | POA: Insufficient documentation

## 2023-06-12 DIAGNOSIS — Z87891 Personal history of nicotine dependence: Secondary | ICD-10-CM | POA: Insufficient documentation

## 2023-06-12 DIAGNOSIS — M353 Polymyalgia rheumatica: Secondary | ICD-10-CM | POA: Insufficient documentation

## 2023-06-12 DIAGNOSIS — Z952 Presence of prosthetic heart valve: Secondary | ICD-10-CM | POA: Diagnosis not present

## 2023-06-12 DIAGNOSIS — R001 Bradycardia, unspecified: Secondary | ICD-10-CM | POA: Insufficient documentation

## 2023-06-12 DIAGNOSIS — Z955 Presence of coronary angioplasty implant and graft: Secondary | ICD-10-CM | POA: Diagnosis not present

## 2023-06-12 LAB — COMPREHENSIVE METABOLIC PANEL WITH GFR
ALT: 17 U/L (ref 0–44)
AST: 12 U/L — ABNORMAL LOW (ref 15–41)
Albumin: 3.2 g/dL — ABNORMAL LOW (ref 3.5–5.0)
Alkaline Phosphatase: 91 U/L (ref 38–126)
Anion gap: 11 (ref 5–15)
BUN: 19 mg/dL (ref 8–23)
CO2: 26 mmol/L (ref 22–32)
Calcium: 8.8 mg/dL — ABNORMAL LOW (ref 8.9–10.3)
Chloride: 99 mmol/L (ref 98–111)
Creatinine, Ser: 0.75 mg/dL (ref 0.61–1.24)
GFR, Estimated: >60 mL/min (ref >60)
Glucose, Bld: 119 mg/dL — ABNORMAL HIGH (ref 70–99)
Potassium: 4.4 mmol/L (ref 3.5–5.1)
Sodium: 136 mmol/L (ref 135–145)
Total Bilirubin: 0.7 mg/dL (ref 0.0–1.2)
Total Protein: 6.3 g/dL — ABNORMAL LOW (ref 6.5–8.1)

## 2023-06-12 LAB — LIPID PANEL
Cholesterol: 163 mg/dL (ref 0–200)
HDL: 34 mg/dL — ABNORMAL LOW (ref >40)
LDL Cholesterol: 104 mg/dL — ABNORMAL HIGH (ref 0–99)
Total CHOL/HDL Ratio: 4.8 ratio
Triglycerides: 123 mg/dL (ref <150)
VLDL: 25 mg/dL (ref 0–40)

## 2023-06-12 MED ORDER — FUROSEMIDE 20 MG PO TABS: 20.0000 mg | ORAL_TABLET | Freq: Every day | ORAL | 3 refills | Status: DC | PRN

## 2023-06-12 MED ORDER — CARVEDILOL 12.5 MG PO TABS: 12.5000 mg | ORAL_TABLET | Freq: Two times a day (BID) | ORAL | 3 refills | Status: AC

## 2023-06-12 MED ORDER — AMLODIPINE BESYLATE 10 MG PO TABS: 10.0000 mg | ORAL_TABLET | Freq: Every day | ORAL | 3 refills | Status: AC

## 2023-06-12 MED ORDER — ATORVASTATIN CALCIUM 80 MG PO TABS: 80.0000 mg | ORAL_TABLET | Freq: Every day | ORAL | 3 refills | Status: AC

## 2023-06-12 MED ORDER — POTASSIUM CHLORIDE ER 10 MEQ PO TBCR: 10.0000 meq | EXTENDED_RELEASE_TABLET | Freq: Four times a day (QID) | ORAL | 3 refills | Status: DC

## 2023-06-12 MED ORDER — EZETIMIBE 10 MG PO TABS: ORAL_TABLET | ORAL | 3 refills | Status: DC

## 2023-06-12 NOTE — Progress Notes (Signed)
 Patient ID: Brandon [name], male   DOB: 1940/04/30, 83 y.o.   MRN: [id number] PCP: [facility]: Dr. Mitzie Anda  82 y.o.with history of CAD, HTN, and hyperlipidemia presents for followup.  Patient had described some exertional chest pain and myoview showed a partially reversible inferior defect, so LHC was done in 7/10.  This showed that his proximal RCA was totally occluded.  There were very robust left to right collaterals.  There was also a 50% proximal LAD stenosis.  The patient was managed medically.    Most recent echo in 5/20 showed EF 65-70% with moderate AS and mild AI.     Earlier in 2018, he developed a Strep intermedius liver abscess.  He had a hepatic drain placed and also had a right pleural effusion requiring diuresis.    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  He is now cancer-free.    Abdominal US  in 10/20 showed AAA up to 4.8 cm. Abdominal US  in 2/22 showed stable 4.8 cm AAA.   Echo in 2/22 showed EF 60-65% with mild LVH, normal RV, PASP 45 mmHg, severe AS with mean gradient 50 mmHg and AVA 0.96 cm^2. RHC/LHC was done in preparation for AVR, this showed 95% pLAD stenosis, 60-70% pLCx, and CTO RCA with collaterals.  FFR was negative for LCx, patient had DES to LAD.   Patient had TAVR in 5/22 with 29 mm Edwards Sapien THV.  Echo in 7/22 showed hyperdynamic LV with EF 70-75%, normal RV, bioprosthetic aortic valve with mild AI and higher mean gradient 16 mmHg.  Echo in 10/22 showed EF 60-65%, moderate asymmetric basal septal hypertrophy, normal RV, normal IVC, bioprosthetic AoV s/p TAVR with mean gradient 16 and trivial PVL.   In 10/22, patient had mechanical fall (tripped) and fractured his left femoral neck.  He had left THR in 10/22.     He underwent an abdominal aorta endovascular stent graft on 03/03/2021. He notes that it was discovered afterwards that he did not have good flow through the left iliac. 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   Admitted 05/23 with AKI any myoclonic jerking. Scr peaked at 7.35, improved to 1.13 at discharge. Etiology felt to be hypovolemia d/t recent N, V, D and possible urinary obstruction. Neurologic workup unrevealing. Had anemia requiring 1 u RBCs. Hgb 9.5 at discharge.   Readmitted 07/23 with severe sepsis/CAP and AKI. Developed CP after choking on pills. Some concern for possible aspiration d/t prior XRT and throat surgeries for cancer. HS troponin 276 > 45 in setting of PNA. Limited echo with preserved EF. Cardiology consulted. No further ischemic workup recommended. Course c/b acute on chronic anemia. Hgb as low as 7.5. + FOBT. Iron  stores low. Outpatient GI f/u recommended.   Last seen 09/2021.  Today he returns for HF follow up. Overall feeling fine. Struggling with his arthritis, more so in his shoulder. He is not SOB with ADLs or cutting his grass. Sinuses are acting up.. Rare dizziness, but no falls. Denies palpitations, abnormal bleeding, CP, edema, or PND/Orthopnea. Appetite fair. Has lost ~ 20 lbs. Weight at home 140 pounds. Taking all medications, no longer takes lasix  daily. Lives at home with his wife. Does not woodwork or restore furniture any longer.  ECG (personally reviewed): SB with PACs  Labs (7/23): K 4.3, creatinine 0.73, hgb 10.2 Labs (8/23): LDL 71 Labs (12/24): K 3.7, creatinine 0.66  Past  Medical History: 1. Hyperlipidemia: Myalgias with atorvastatin  and Crestor. 2. Aortic stenosis.  Echocardiogram in August 2009 showed EF 60%, no regional wall motion abnormalities, mild LVH, moderate focal basal septal hypertrophy, and mild diastolic dysfunction.  There was a very mild aortic stenosis with partially fused left and right coronary cusps and some restricted motion of the aortic valve.  The mean gradient, however, across the aortic valve was only 8 mmHg.  There was also mild left atrial  enlargement and normal RV size and function.  Minimal AS on LHC in 7/10.  Echo (7/14) with EF 60-65%, mild LVH, mild AS mean gradient 13 mmHg.  Echo (6/16) with EF 55-60%, mild AS with mean gradient 14 mmHg.  - Echo (6/18): EF 55-60%, mild LVH, moderate AS mean gradient 27 mmHg, mild AI.   - Echo (5/20, [facility]): EF 65-70%, moderate AS, mild AI.  - Echo (2/22): EF 60-65% with mild LVH, normal RV, PASP 45 mmHg, severe AS with mean gradient 50 mmHg and AVA 0.96 cm^2.  - TAVR with 29 mm Edwards Sapien THV in 5/22.  - Echo (7/22): Hyperdynamic LV with EF 70-75%, normal RV, bioprosthetic aortic valve with mild AI and higher mean gradient 16 mmHg. - Echo (10/22): EF 60-65%, moderate asymmetric basal septal hypertrophy, normal RV, normal IVC, bioprosthetic AoV s/p TAVR with mean gradient 16 and trivial PVL. - Echo (06/23): EF 65-70%, RV okay, mean gradient of 8 mmHg across aortic valve prosthesis, mild perivalvular leak - Limited echo (7/23): EF 65-70% 3. Hypertension. 4. Gastroesophageal reflux disease: h/o esophageal stricture.  5. BPH. 6. Diverticulosis. 7. Smoked until 7/10. 8. CAD: LHC (7/10) showed totally occluded proximal RCA with very robust left to right collaterals, 50% proximal LAD stenosis, EF 65%.   - LHC (2/22): 95% stenosis proximal LAD, 60-70% proximal LCx, totally occluded RCA with collaterals.  Negative FFR of LCx, patient had DES to LAD.   9. Osteoarthritis 10. Colonic polyps 11. h/o TIA 12. 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  This manifested with slurred speech. 13. History of low back pain.  The patient is status post surgical back fusion. 14. History of cholecystectomy.  15. Cataract surgery 16. Prostate cancer: treated 17. Liver abscess: Strep intermedius, 2018.  18. AAA: Noted in 8/18 on CT abdomen, 4.3 cm.  - Abdominal US  (10/20): 4.8 cm AAA.  - Abdominal US  (2/22): 4.8 cm AAA - CT abdomen 3/22 with 5 cm AAA - CT abdomen (10/22): 5.2 cm AAA - s/p AAA  repair 1/23 with stent graft 19. Carotid stenosis: carotid dopplers 10/18 with 40-59% RICA stenosis.  - Carotid dopplers (10/20): 50-69% RICA stenosis.   - Carotid dopplers (2/22): Mild BICA stenosis.  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  22. Gout 23. Mechanical fall with left femoral neck fracture and left THR in 10/22.  24. GI bleeding with Fe deficiency anemia.   Family History: Father alive w/ CAD & CABG at [facility] in 2009, had TAVR as well.  Mother died age 36 w/ Alzheimer's disease, hx of DJD 1 Sibling- Brother alive & well w/ DJD in his hips  Social History: Married- 3rd marriage, wife= Etta 2 children from 1st marriage, 4 step children Former smoker, 40+yrs of >1ppd, quit 7/10. Restarted, then quit again in 4/22.  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   Current Outpatient Medications  Medication Sig Dispense Refill   acetaminophen  (TYLENOL ) 500 MG tablet Take 1,000 mg by mouth every 8 (eight) hours as needed for mild pain or headache.     albuterol  (VENTOLIN  HFA) 108 (90 Base) MCG/ACT inhaler Inhale 2 puffs into the lungs every 6 (six) hours as needed for wheezing or shortness of breath. 6.7 g 2   allopurinol  (ZYLOPRIM ) 100 MG tablet Take 2 tablets (200 mg total) by mouth daily. 180 tablet 1   amLODipine  (NORVASC ) 10 MG tablet TAKE 1 TABLET(10 MG) BY MOUTH DAILY 90 tablet 0   aspirin  EC 81 MG tablet Take 81 mg by mouth at bedtime. Swallow whole.     atorvastatin  (LIPITOR ) 80 MG tablet TAKE 1 TABLET(80 MG) BY MOUTH DAILY 90 tablet 3   carvedilol  (COREG ) 12.5 MG tablet Take 1 tablet (12.5 mg total) by mouth 2 (two) times daily. 60 tablet 3   cyanocobalamin  (VITAMIN B12) 1000 MCG tablet Take 1 tablet (1,000 mcg total) by mouth  daily. 90 tablet 3   dorzolamide -timolol  (COSOPT ) 2-0.5 % ophthalmic solution Place 1 drop into the left eye 2 (two) times daily. 1 mL 12   ezetimibe  (ZETIA ) 10 MG tablet TAKE 1 TABLET(10 MG) BY MOUTH DAILY. NEED FOLLOW UP APPOINTMENT FOR MORE REFILLS 30 tablet 0   feeding supplement (BOOST HIGH PROTEIN) LIQD Take 1 Container by mouth 3 (three) times daily between meals.     ferrous sulfate  325 (65 FE) MG EC tablet Take 1 tablet (325 mg total) by mouth daily with breakfast. 30 tablet 0   FLUoxetine  (PROZAC ) 20 MG capsule Take 1 capsule (20 mg total) by mouth daily. 90 capsule 3   fluticasone  (FLONASE ) 50 MCG/ACT nasal spray SHAKE LIQUID AND USE 2 SPRAYS IN EACH NOSTRIL DAILY AS NEEDED FOR RHINITIS OR ALLERGIES (Patient taking differently: Place 2 sprays into both nostrils daily.) 16 g 11   gabapentin  (NEURONTIN ) 300 MG capsule Take 1 capsule (300 mg total) by mouth 2 (two) times daily. 60 capsule 2   guaiFENesin  (MUCINEX ) 600 MG 12 hr tablet Take 600 mg by mouth 2 (two) times daily as needed for cough or to loosen phlegm.     hydrOXYzine  (ATARAX ) 10 MG tablet Take 1-2 tablets (10-20 mg total) by mouth at bedtime as needed for itching (or sleep). 30 tablet 3   ipratropium (ATROVENT ) 0.02 % nebulizer solution Use 1 vial (0.5 mg total) by nebulization every 6 (six) hours as needed for wheezing or shortness of breath. 75 mL 12   morphine  (MS CONTIN ) 15 MG 12 hr tablet Take 1 tablet (15 mg total) by mouth every 12 (twelve) hours. Do Not Fill Before  06/11/23 60 tablet 0   naloxone  (NARCAN ) nasal spray 4 mg/0.1 mL For possible accidentally overdose. 2 each 5   oxyCODONE  10 MG TABS Take 1 tablet (10 mg total) by mouth every 4 (four) hours as needed for severe pain (pain score 7-10). 150 tablet 0   pantoprazole  (PROTONIX ) 40 MG tablet Take 1 tablet (40 mg total) by mouth daily. (Patient taking differently: Take 40 mg by mouth daily as needed (for heartburn).) 30 tablet 2   potassium chloride  (KLOR-CON ) 10 MEQ  tablet Take 10 mEq by mouth 4 (four) times daily.     predniSONE  (DELTASONE ) 5 MG tablet TAKE 1 TABLET(5 MG) BY MOUTH DAILY WITH BREAKFAST 90 tablet 0   tamsulosin  (FLOMAX ) 0.4 MG CAPS capsule Take 0.4 mg by mouth See  admin instructions. Take 0.4 mg by mouth one to two times a day     tamsulosin  (FLOMAX ) 0.4 MG CAPS capsule Take 1 capsule (0.4 mg total) by mouth daily. 30 capsule 3   Tiotropium Bromide  Monohydrate (SPIRIVA  RESPIMAT) 2.5 MCG/ACT AERS Inhale 2 puffs into the lungs daily. 4 g 6   traZODone  (DESYREL ) 50 MG tablet TAKE 1/2 TO 1 TABLET(25 TO 50 MG) BY MOUTH AT BEDTIME AS NEEDED FOR SLEEP 30 tablet 3   triamcinolone  cream (KENALOG ) 0.1 % Apply 1 Application topically 2 (two) times daily as needed (to itchy sites).     trolamine salicylate (ASPERCREME) 10 % cream Apply 1 Application topically 2 (two) times daily as needed (arthritis pain).     No current facility-administered medications for this encounter.   Wt Readings from Last 3 Encounters:  06/12/23 63.6 kg (140 lb 3.2 oz)  05/28/23 64.9 kg (143 lb)  05/22/23 65.5 kg (144 lb 8 oz)   BP 118/62   Pulse 69   Ht 5\' 8"  (1.727 m)   Wt 63.6 kg (140 lb 3.2 oz)   SpO2 96%   BMI 21.32 kg/m  Physical Exam: General:  NAD. No resp difficulty HEENT: Normal Neck: Supple. No JVD. Cor: Regular rate & rhythm. No rubs, gallops or murmurs. Lungs: Clear Abdomen: Soft, nontender, nondistended.  Extremities: No cyanosis, clubbing, rash, edema Neuro: Alert & oriented x 3, moves all 4 extremities w/o difficulty. Affect pleasant.  Assessment/Plan: 1. Aortic stenosis: Patient had TAVR 06/07/20 by left subclavian access given presence of AAA with 29 mm Edwards Sapien THV.  Echo in 10/22 with stable mean gradient 16 mmHg and trivial PVL.  - Echo 06/23: EF 65-70%, RV okay, mean gradient of 8 mmHg across aortic valve prosthesis, mild perivalvular leak - Valve appears to function normally.  - Repeat echo down the road. 2. CAD: LHC in 2/22 with CTO  RCA with collaterals, 95% pLAD treated with DES, 60-70% proximal LCx (FFR negative). No further exertional chest pain since PCI.  - Continue atorvastatin , check lipid panel today - Continue ASA 81. 3. Hyperlipidemia:  Check lipids as above 4. HTN: BP well controlled. - Continue amlodipine  10 mg daily.  - Continue Coreg  12.5 mg bid.  - Continue lisinopril  20 mg daily.   5. Abdominal aortic aneurysm: 5.2 cm by CT in 10/22.   - s/p AAA repair with Dr. Vikki Graves 1/23. - No evidence of endoleak on US  04/23 6. Carotid stenosis: Mild by 2/22 dopplers.  7. Smoking: He has quit.   - No change. 8. Chronic diastolic CHF: He is not volume overloaded on exam.   - He does not need daily lasix , will give Rx for Lasix  20 mg for PRN use. 9. Polymyalgia rheumatica: Continues with joint pain.  He is on prednisone  & followed by Rheum.  Follow up in 6 months with Dr. America Bake [facility] FNP 06/12/2023

## 2023-06-12 NOTE — Patient Instructions (Signed)
 Medication Changes:  START: LASIX  (FUROSEMIDE ) 20MG  ONCE DAILY AS NEEDED FOR SWELLING OR WEIGHT GAIN OF 5 POUNDS IN ONE WEEK OR 3 POUNDS OVERNIGHT   Lab Work:  Labs done today, your results will be available in MyChart, we will contact you for abnormal readings.  Follow-Up in: 6 MONTHS WITH DR. Mitzie Anda PLEASE CALL OUR OFFICE AROUND AUGUST TO GET SCHEDULED FOR YOUR APPOINTMENT. PHONE NUMBER IS [phone] OPTION 2  At the [facility], you and your health needs are our priority. We have a designated team specialized in the treatment of Heart Failure. This Care Team includes your primary Heart Failure Specialized Cardiologist (physician), Advanced Practice Providers (APPs- Physician Assistants and Nurse Practitioners), and Pharmacist who all work together to provide you with the care you need, when you need it.   You may see any of the following providers on your designated Care Team at your next follow up:  Dr. Jules Oar Dr. Peder Bourdon Dr. Alwin Baars Dr. Judyth Nunnery Nieves Bars, NP Ruddy Corral, [facility] [place], [place] Dennise Fitz, NP [place] Lee, NP Luster Salters, PharmD   Please be sure to bring in all your medications bottles to every appointment.   Need to Contact Us :  If you have any questions or concerns before your next appointment please send us  a message through [place] or call our office at [phone].    TO LEAVE A MESSAGE FOR THE NURSE SELECT OPTION 2, PLEASE LEAVE A MESSAGE INCLUDING: YOUR NAME DATE OF BIRTH CALL BACK NUMBER REASON FOR CALL**this is important as we prioritize the call backs  YOU WILL RECEIVE A CALL BACK THE SAME DAY AS LONG AS YOU CALL BEFORE 4:00 PM

## 2023-06-17 ENCOUNTER — Telehealth: Payer: Self-pay

## 2023-06-18 ENCOUNTER — Ambulatory Visit (HOSPITAL_COMMUNITY): Payer: Self-pay

## 2023-06-18 DIAGNOSIS — I5032 Chronic diastolic (congestive) heart failure: Secondary | ICD-10-CM

## 2023-06-18 MED ORDER — MORPHINE SULFATE ER 15 MG PO TBCR: 15.0000 mg | EXTENDED_RELEASE_TABLET | Freq: Two times a day (BID) | ORAL | 0 refills | Status: DC

## 2023-06-18 NOTE — Telephone Encounter (Signed)
 Pt notified by my chart message that he needs to remember that Dr Raynaldo Call sends in 2 months rx each time.

## 2023-06-19 NOTE — Telephone Encounter (Addendum)
 Spoke to patient about lab work.Pt has been taking Lipitor  daily. Lab work scheduled  ----- Message from Adrian sent at 06/18/2023  4:10 PM EDT ----- LDL elevated, only been taking statin x 3 weeks.  Repeat lipids in 4-6 weeks

## 2023-06-20 ENCOUNTER — Ambulatory Visit: Payer: Medicare Other | Admitting: Physician Assistant

## 2023-06-20 NOTE — Progress Notes (Signed)
 Office Visit Note  Patient: Brandon [name]             Date of Birth: 28-Apr-1940           MRN: [id number]             PCP: Graig Lawyer, MD Referring: Graig Lawyer, MD Visit Date: 06/24/2023 Occupation: @GUAROCC @  Subjective:  Chronic pain   History of Present Illness: Brandon [name] is a 83 y.o. male with history of polymyalgia rheumatica, osteoarthritis, and gout.  He remains on prednisone  5 mg daily.  Patient has chronic total body pain and remains under the care of Dr. Lovorn for pain management.  He has had persistent discomfort in the left shoulder despite having an injection on 05/28/23.  Patient has full passive range of motion of the left shoulder but has painful limited active range of motion.  Patient states that he recently increased his dose of prednisone  to 10 mg for 2 days which helped to alleviate the discomfort in his left shoulder.  Patient states that he is tired of being in pain and would like to discuss increasing the dose of prednisone  long-term. He denies any signs or symptoms of a gout flare.  He has been taking allopurinol  200 mg daily.  Activities of Daily Living:  Patient reports morning stiffness for 1 hour.   Patient Denies nocturnal pain.  Difficulty dressing/grooming: Denies Difficulty climbing stairs: Denies Difficulty getting out of chair: Denies Difficulty using hands for taps, buttons, cutlery, and/or writing: Reports  Review of Systems  Constitutional:  Positive for fatigue.  HENT:  Positive for mouth dryness. Negative for mouth sores.   Eyes:  Positive for dryness.  Respiratory:  Negative for shortness of breath.   Cardiovascular:  Negative for chest pain and palpitations.  Gastrointestinal:  Negative for blood in stool, constipation and diarrhea.  Endocrine: Negative for increased urination.  Genitourinary:  Negative for involuntary urination.  Musculoskeletal:  Positive for joint pain, joint pain, myalgias, morning stiffness and myalgias.  Negative for gait problem, joint swelling, muscle weakness and muscle tenderness.  Skin:  Positive for hair loss. Negative for color change, rash and sensitivity to sunlight.  Allergic/Immunologic: Negative for susceptible to infections.  Neurological:  Positive for dizziness. Negative for headaches.  Hematological:  Negative for swollen glands.  Psychiatric/Behavioral:  Positive for depressed mood and sleep disturbance. The patient is not nervous/anxious.     PMFS History:  Patient Active Problem List   Diagnosis Date Noted   Primary osteoarthritis, left shoulder 02/27/2023   Anemia of chronic disease 02/11/2023   Hyperglycemia 02/11/2023   Vitamin B12 deficiency 02/11/2023   Age related osteoporosis 08/31/2022   Malnutrition of mild degree (HCC) 08/01/2022   Insomnia 06/26/2022   Arthritis of left hand 06/26/2022   Weight loss 06/26/2022   Chronic gout of multiple sites 04/30/2022   Chronically on opiate therapy 02/12/2022   Sacroiliac joint pain 02/12/2022   At risk for polypharmacy 01/19/2022   Trochanteric bursitis of left hip 01/02/2022   Dysfunction of left eustachian tube 12/05/2021   Hypotension 08/18/2021   Hyponatremia 06/26/2021   Hyperkalemia 06/26/2021   Acute metabolic encephalopathy 06/26/2021   Myoclonic jerking 06/26/2021   Lumbar radiculopathy 06/08/2021   Lumbar postlaminectomy syndrome 05/03/2021   Degeneration of lumbar intervertebral disc 05/03/2021   Lumbar spondylosis 05/03/2021   History of hip fracture 04/24/2021   S/P abdominal aortic aneurysm repair 04/24/2021   Chronic allergic rhinitis 01/24/2021   Demand ischemia (  HCC)    Elevated troponin    Chronic diastolic heart failure (HCC) 12/03/2020   Steatosis of liver 11/01/2020   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   Hepatitis B core antibody positive 11/01/2020   Benign prostatic hyperplasia 11/01/2020   Chronic obstructive pulmonary disease (HCC) 07/20/2020   History of placement of stent in LAD  coronary artery 07/20/2020   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   TIA (transient ischemic attack)    Esophageal stricture    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   Primary osteoarthritis of both hands 01/11/2020   Piriformis syndrome of both sides 09/23/2019   Sciatica associated with disorder of lumbar spine 04/24/2019   Primary osteoarthritis involving multiple joints 04/24/2019   Chronic pain syndrome 04/24/2019   Mixed anxiety and depressive disorder 04/15/2018   Oropharyngeal dysphagia 03/11/2018   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   Tobacco abuse disorder 01/10/2014   Essential hypertension 05/19/2013   Psoriasis 01/13/2009   Coronary artery disease 09/15/2008   Diverticulosis of colon 08/27/2007   History of benign prostatic hyperplasia 08/27/2007   Hyperlipidemia 03/19/2007   Gastroesophageal reflux disease 03/19/2007    Past Medical History:  Diagnosis Date   Abdominal aneurysm (HCC)    Arthritis    "all over" (07/19/2016)   BPH (benign prostatic hypertrophy)    CAD (coronary artery disease)    Chicken pox    Chronic lower back pain    s/p surgical fusion   Depression    Diverticulosis    Esophageal stricture    GERD (gastroesophageal reflux disease)    Hepatitis B 1984   Hiatal hernia    History of radiation therapy 01/16/18- 03/05/18   Left Tonsil, 66 Gy in 33 fractions to high risk nodal echelons.    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 Arlester Ladd and Dr Sherene Dilling    Severe aortic stenosis    TIA (transient ischemic attack) 1990s   hx   tonsillar ca dx'd 11/2017   Tubular adenoma of colon 2009    Family History  Problem Relation Age of Onset   Heart disease Father 34       Living   Coronary artery disease Father         CABG   Alzheimer's disease Mother 72       Deceased   Arthritis Mother    Aneurysm Brother    Stomach cancer Maternal Uncle    Brain cancer Maternal Aunt        x2   Obesity Daughter        Had Bypass Sx   Past Surgical History:  Procedure Laterality Date   ABDOMINAL AORTIC ENDOVASCULAR STENT GRAFT N/A 03/03/2021   Procedure: ABDOMINAL AORTIC ENDOVASCULAR STENT GRAFT;  Surgeon: Adine Hoof, MD;  Location: [facility] OR;  Service: Vascular;  Laterality: N/A;   ABDOMINAL AORTOGRAM W/LOWER EXTREMITY N/A 04/10/2021   Procedure: ABDOMINAL AORTOGRAM W/LOWER EXTREMITY;  Surgeon: Adine Hoof, MD;  Location: [facility] INVASIVE CV LAB;  Service: Cardiovascular;  Laterality: N/A;   BACK SURGERY     CARDIAC CATHETERIZATION     CATARACT EXTRACTION W/ INTRAOCULAR LENS  IMPLANT, BILATERAL Bilateral 01/2012 - 02/2012   CORONARY ATHERECTOMY N/A 04/27/2020   Procedure: CORONARY ATHERECTOMY;  Surgeon: Arnoldo Lapping, MD;  Location:  [facility] INVASIVE CV LAB;  Service: Cardiovascular;  Laterality: N/A;   CORONARY IMAGING/OCT N/A 04/27/2020   Procedure: INTRAVASCULAR IMAGING/OCT;  Surgeon: Arnoldo Lapping, MD;  Location: [facility] INVASIVE CV LAB;  Service: Cardiovascular;  Laterality: N/A;   CORONARY PRESSURE/FFR STUDY N/A 04/27/2020   Procedure: INTRAVASCULAR PRESSURE WIRE/FFR STUDY;  Surgeon: Arnoldo Lapping, MD;  Location: [facility] INVASIVE CV LAB;  Service: Cardiovascular;  Laterality: N/A;   CORONARY STENT INTERVENTION N/A 04/27/2020   Procedure: CORONARY STENT INTERVENTION;  Surgeon: Arnoldo Lapping, MD;  Location: [facility] INVASIVE CV LAB;  Service: Cardiovascular;  Laterality: N/A;   DIRECT LARYNGOSCOPY Left 10/09/2017   Procedure: DIRECT LARYNGOSCOPY WITH BOPSY;  Surgeon: Lenton Rail, MD;  Location: [facility];  Service: ENT;  Laterality: Left;   ESOPHAGOGASTRODUODENOSCOPY (EGD) WITH ESOPHAGEAL DILATION     "couple times" (07/19/2016)   ESOPHAGOSCOPY Left 10/09/2017   Procedure: ESOPHAGOSCOPY;   Surgeon: Lenton Rail, MD;  Location: [facility];  Service: ENT;  Laterality: Left;   IR GASTROSTOMY TUBE MOD SED  01/08/2018   IR THORACENTESIS ASP PLEURAL SPACE W/IMG GUIDE  07/19/2016   LAPAROSCOPIC CHOLECYSTECTOMY  1984   LUMBAR DISC SURGERY  05/1996   L4-5; Dr. Antonina Kleine LAMINECTOMY/DECOMPRESSION MICRODISCECTOMY  10/2002   L3-4. Dr. Cipriano Creeks   MULTIPLE TOOTH EXTRACTIONS  1980s   PARTIAL GLOSSECTOMY  12/02/2017   Dr. Caldwell Castles- [facility]   pharyngoplasty for closure of tingue base defect  12/02/2017   Dr. Caldwell Castles- [facility]   PICC LINE INSERTION  07/15/2016   POSTERIOR LUMBAR FUSION  09/1996   Ray cage, L4-5 Dr. Lawson Prey   PROSTATE BIOPSY  ~ 2017   radical tonsillectomy Left 12/02/2017   Dr. Caldwell Castles at [facility]   RIGHT HEART CATH AND CORONARY ANGIOGRAPHY N/A 03/31/2020   Procedure: RIGHT HEART CATH AND CORONARY ANGIOGRAPHY;  Surgeon: Darlis Eisenmenger, MD;  Location: [facility] INVASIVE CV LAB;  Service: Cardiovascular;  Laterality: N/A;   RIGID BRONCHOSCOPY Left 10/09/2017   Procedure: RIGID BRONCHOSCOPY;  Surgeon: Lenton Rail, MD;  Location: [facility];  Service: ENT;  Laterality: Left;   TEE WITHOUT CARDIOVERSION N/A 06/07/2020   Procedure: TRANSESOPHAGEAL ECHOCARDIOGRAM (TEE);  Surgeon: Arnoldo Lapping, MD;  Location: [facility] OR;  Service: Open Heart Surgery;  Laterality: N/A;   TONSILLECTOMY     TOTAL HIP ARTHROPLASTY Left 12/04/2020   Procedure: TOTAL HIP ARTHROPLASTY ANTERIOR APPROACH;  Surgeon: Adonica Hoose, MD;  Location: [facility] ORS;  Service: Orthopedics;  Laterality: Left;   TOTAL HIP ARTHROPLASTY Left 11/2020   TRACHEOSTOMY  12/02/2017   Dr. Caldwell Castles- [facility]   ULTRASOUND GUIDANCE FOR VASCULAR ACCESS Right 06/07/2020   Procedure: ULTRASOUND GUIDANCE FOR VASCULAR ACCESS;  Surgeon: Arnoldo Lapping, MD;  Location: [facility] OR;  Service: Open Heart Surgery;  Laterality: Right;   ULTRASOUND GUIDANCE FOR VASCULAR ACCESS Bilateral 03/03/2021   Procedure: ULTRASOUND  GUIDANCE FOR VASCULAR ACCESS, BILATERAL FEMORAL ARTERIES;  Surgeon: Adine Hoof, MD;  Location: [facility] OR;  Service: Vascular;  Laterality: Bilateral;   VASCULAR SURGERY     Social History   Social History Narrative   ** Merged History Encounter **       Married (3rd), [place]. 2 children from 1st marriage, 4 step children.    Retired on disability due to back    Former Runner, broadcasting/film/video.   restores antique furniture for a hobby.       Cell # [id number]   Immunization History  Administered Date(s) Administered   Fluad Quad(high Dose  65+) 10/15/2018, 11/06/2019, 10/20/2020   Influenza Split 11/26/2010, 10/11/2011, 11/03/2012   Influenza Whole 11/14/2006, 12/04/2007, 10/06/2008, 10/25/2009, 11/26/2021   Influenza, High Dose Seasonal PF 11/01/2016, 10/30/2017   Influenza, Seasonal, Injecte, Preservative Fre 12/07/2014   Influenza,inj,Quad PF,6+ Mos 11/04/2015   Influenza-Unspecified 11/19/2013, 11/06/2022   PFIZER(Purple Top)SARS-COV-2 Vaccination 02/25/2019, 03/18/2019, 11/16/2019   Pneumococcal Conjugate-13 12/07/2014   Pneumococcal Polysaccharide-23 05/30/2017   Tdap 01/28/2020     Objective: Vital Signs: BP (!) 104/54 (BP Location: Left Arm, Patient Position: Sitting, Cuff Size: Normal)   Pulse (!) 59   Resp 15   Ht 5' 8.5" (1.74 m)   Wt 140 lb 6.4 oz (63.7 kg)   BMI 21.04 kg/m    Physical Exam Vitals and nursing note reviewed.  Constitutional:      Appearance: He is well-developed.  HENT:     Head: Normocephalic and atraumatic.  Eyes:     Conjunctiva/sclera: Conjunctivae normal.     Pupils: Pupils are equal, round, and reactive to light.  Cardiovascular:     Rate and Rhythm: Normal rate and regular rhythm.     Heart sounds: Normal heart sounds.  Pulmonary:     Effort: Pulmonary effort is normal.     Breath sounds: Normal breath sounds.  Abdominal:     General: Bowel sounds are normal.     Palpations: Abdomen is soft.   Musculoskeletal:     Cervical back: Normal range of motion and neck supple.  Skin:    General: Skin is warm and dry.     Capillary Refill: Capillary refill takes less than 2 seconds.     Findings: Bruising present.  Neurological:     Mental Status: He is alert and oriented to person, place, and time.  Psychiatric:        Behavior: Behavior normal.      Musculoskeletal Exam: C-spine has limited range of motion with lateral rotation, flexion, and extension.  Thoracic opposes noted.  Painful limited mobility of the lumbar spine.  Right shoulder is full range of motion.  Left shoulder has painful and limited abduction and internal rotation.  Elbow joints have good ROM.  PIP and DIP thickening consistent with osteoarthritis of both hands.  Knee joints have good ROM with no warmth or effusion.  Ankle joints have good ROM with no tenderness or joint swelling.   CDAI Exam: CDAI Score: -- Patient Global: --; Provider Global: -- Swollen: --; Tender: -- Joint Exam 06/24/2023   No joint exam has been documented for this visit   There is currently no information documented on the homunculus. Go to the Rheumatology activity and complete the homunculus joint exam.  Investigation: No additional findings.  Imaging: No results found.   Recent Labs: Lab Results  Component Value Date   WBC 7.0 01/24/2023   HGB 11.6 (L) 01/24/2023   PLT 112 (L) 01/24/2023   NA 136 06/12/2023   K 4.4 06/12/2023   CL 99 06/12/2023   CO2 26 06/12/2023   GLUCOSE 119 (H) 06/12/2023   BUN 19 06/12/2023   CREATININE 0.75 06/12/2023   BILITOT 0.7 06/12/2023   ALKPHOS 91 06/12/2023   AST 12 (L) 06/12/2023   ALT 17 06/12/2023   PROT 6.3 (L) 06/12/2023   ALBUMIN  3.2 (L) 06/12/2023   CALCIUM  8.8 (L) 06/12/2023   GFRAA 107 08/11/2020   QFTBGOLDPLUS NEGATIVE 01/11/2020    Speciality Comments: Fosamax  started on January 26, 2021.  If he has dysphagia with Fosamax  or any GI intolerance we  will have to switch  him to Reclast .  Procedures:  No procedures performed Allergies: Tape and Celebrex [celecoxib]   Assessment / Plan:     Visit Diagnoses: Polymyalgia rheumatica (HCC) - History of muscle weakness and tenderness, elevated sed rate. Prednisone  responsive: Patient is not exhibiting any signs of a polymyalgia rheumatica flare.  He has been experiencing persistent discomfort in the left shoulder despite having a left humeral joint cortisone injection on 05/28/2023.  He has full passive range of motion of the left shoulder but discomfort and limitation with active range of motion.  He has been taking prednisone  5 mg daily but recently increased the dose to 10 mg which helped to alleviate his discomfort and improved his range of motion. Patient presented today to discuss dosing of prednisone  going forward.  His quality of life has been suffering due to the severity of symptoms he is experiencing.  Plan to increase prednisone  to 10 mg daily x 1 week then 9 mg daily x 1 week then he will remain on prednisone  8 mg daily.  He is aware of the risks of long-term prednisone  use.  He will notify us  if his symptoms persist or worsen.  Long term (current) use of systemic steroids - Prednisone  10 mg daily x1 week, 9 mg daily x1 week, then reduce to 8 mg daily. He is aware of the risks of long term prednisone  use. Initiated IV reclast  on 09/11/22. CMP and lipid panel updated on 06/12/23.   Chronic left shoulder pain: X-rays obtained by Dr. Rozelle Corning on 02/27/2023.  He had a left glenohumeral joint cortisone injection on 05/28/2023.  Patient continues to experience significant discomfort in the left shoulder.  He has full passive range of motion but painful limited active range of motion noted on examination today.  He has been taking prednisone  5 mg daily but recently increased the dose to 10 mg daily which helped to alleviate his discomfort and improve his range of motion. Patient has requested to increase the dose of prednisone   which was discussed above.  He is also planning to have another injection once he is eligible after 08/27/2023.   Primary osteoarthritis of both hands: PIP and DIP thickening.  No synovitis noted.    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  Doing well.    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 uric acid: 4.4 on 01/24/2023.  He is taking allopurinol  200 mg daily. No signs or symptoms of a gout flare.    Degeneration of intervertebral disc of lumbar region without discogenic back pain or lower extremity pain: Chronic pain.  Under the care of Dr. Lovorn.   Age-related osteoporosis without current pathological fracture - DEXA 07/27/2022:RFN BMD 0.558 T-score -2.7.  -20% change in BMD for right total femur. treated with Fosamax  in the past.  Last Reclast  infusion on September 11, 2022.  He tolerated Reclast  without any side effects.  Plan to repeat reclast  in August 2025.   Plan to repeat DEXA in 2026.   Vitamin D  deficiency  Chronic pain syndrome: He remains under the care of Dr. Lovorn.   Psoriasis: Not currently symptomatic.  Other medical conditions are listed as follows:  Aneurysm of infrarenal abdominal aorta, unspecified whether ruptured (HCC)  History of aortic valve replacement  Oropharyngeal dysphagia  History of gastroesophageal reflux (GERD)  History of hyperlipidemia  Anxiety and depression  Essential hypertension: Blood pressure was  104/54 today in the office.  Cancer of tonsillar fossa (HCC)  Former smoker  History of iron  deficiency anemia  Orders: No orders of the defined types were placed in this encounter.  No orders of the defined types were placed in this encounter.   Follow-Up Instructions: Return in about 3 months (around 09/24/2023) for Polymyalgia Rheumatica, Osteoarthritis, Gout.   Romayne Clubs,  PA-C  Note - This record has been created using Dragon software.  Chart creation errors have been sought, but may not always  have been located. Such creation errors do not reflect on  the standard of medical care.

## 2023-06-21 ENCOUNTER — Ambulatory Visit: Payer: Medicare Other

## 2023-06-21 DIAGNOSIS — Z0000 Encounter for general adult medical examination without abnormal findings: Secondary | ICD-10-CM | POA: Diagnosis not present

## 2023-06-21 NOTE — Progress Notes (Signed)
 Subjective:   Brandon [name] is a 83 y.o. who presents for a Medicare Wellness preventive visit.  As a reminder, Annual Wellness Visits don't include a physical exam, and some assessments may be limited, especially if this visit is performed virtually. We may recommend an in-person follow-up visit with your provider if needed.  Visit Complete: Virtual I connected with  Brandon [name] on 06/21/23 by a audio enabled telemedicine application and verified that I am speaking with the correct person using two identifiers.  Patient Location: Home  Provider Location: Office/Clinic  I discussed the limitations of evaluation and management by telemedicine. The patient expressed understanding and agreed to proceed.  Vital Signs: Because this visit was a virtual/telehealth visit, some criteria may be missing or patient reported. Any vitals not documented were not able to be obtained and vitals that have been documented are patient reported.  VideoError- Librarian, academic were attempted between this provider and patient, however failed, due to patient having technical difficulties OR patient did not have access to video capability.  We continued and completed visit with audio only.   Persons Participating in Visit: Patient.  AWV Questionnaire: No: Patient Medicare AWV questionnaire was not completed prior to this visit.  Cardiac Risk Factors include: advanced age (>58men, >53 women);dyslipidemia;hypertension;male gender     Objective:     Today's Vitals   There is no height or weight on file to calculate BMI.     06/21/2023    9:38 AM 03/16/2023    9:50 AM 06/15/2022    9:48 AM 01/17/2022   11:03 PM 06/28/2021   10:00 PM 06/26/2021    9:28 AM 06/07/2021    4:02 PM  Advanced Directives  Does Patient Have a Medical Advance Directive? Yes No Yes No Yes Yes Yes  Type of Advance Directive Out of facility DNR (pink MOST or yellow form)  Out of facility DNR (pink MOST or  yellow form)  Healthcare Power of [employer] of [place];Living will  Does patient want to make changes to medical advance directive?     No - Patient declined    Copy of Healthcare Power of Attorney in Chart?     No - copy requested  No - copy requested  Would patient like information on creating a medical advance directive?  No - Patient declined  No - Patient declined       Current Medications (verified) Outpatient Encounter Medications as of 06/21/2023  Medication Sig   acetaminophen  (TYLENOL ) 500 MG tablet Take 1,000 mg by mouth every 8 (eight) hours as needed for mild pain or headache.   albuterol  (VENTOLIN  HFA) 108 (90 Base) MCG/ACT inhaler Inhale 2 puffs into the lungs every 6 (six) hours as needed for wheezing or shortness of breath.   allopurinol  (ZYLOPRIM ) 100 MG tablet Take 2 tablets (200 mg total) by mouth daily.   amLODipine  (NORVASC ) 10 MG tablet Take 1 tablet (10 mg total) by mouth daily.   aspirin  EC 81 MG tablet Take 81 mg by mouth at bedtime. Swallow whole.   atorvastatin  (LIPITOR ) 80 MG tablet Take 1 tablet (80 mg total) by mouth daily.   carvedilol  (COREG ) 12.5 MG tablet Take 1 tablet (12.5 mg total) by mouth 2 (two) times daily.   cyanocobalamin  (VITAMIN B12) 1000 MCG tablet Take 1 tablet (1,000 mcg total) by mouth daily.   dorzolamide -timolol  (COSOPT ) 2-0.5 % ophthalmic solution Place 1 drop into the left eye 2 (two) times daily.  ezetimibe  (ZETIA ) 10 MG tablet TAKE 1 TABLET ONCE DAILY   feeding supplement (BOOST HIGH PROTEIN) LIQD Take 1 Container by mouth 3 (three) times daily between meals.   FLUoxetine  (PROZAC ) 20 MG capsule Take 1 capsule (20 mg total) by mouth daily.   fluticasone  (FLONASE ) 50 MCG/ACT nasal spray SHAKE LIQUID AND USE 2 SPRAYS IN EACH NOSTRIL DAILY AS NEEDED FOR RHINITIS OR ALLERGIES (Patient taking differently: Place 2 sprays into both nostrils daily.)   furosemide  (LASIX ) 20 MG tablet Take 1 tablet (20 mg total) by mouth daily as  needed for fluid or edema.   gabapentin  (NEURONTIN ) 300 MG capsule Take 1 capsule (300 mg total) by mouth 2 (two) times daily.   guaiFENesin  (MUCINEX ) 600 MG 12 hr tablet Take 600 mg by mouth 2 (two) times daily as needed for cough or to loosen phlegm.   hydrOXYzine  (ATARAX ) 10 MG tablet Take 1-2 tablets (10-20 mg total) by mouth at bedtime as needed for itching (or sleep).   ipratropium (ATROVENT ) 0.02 % nebulizer solution Use 1 vial (0.5 mg total) by nebulization every 6 (six) hours as needed for wheezing or shortness of breath.   naloxone  (NARCAN ) nasal spray 4 mg/0.1 mL For possible accidentally overdose.   oxyCODONE  10 MG TABS Take 1 tablet (10 mg total) by mouth every 4 (four) hours as needed for severe pain (pain score 7-10).   potassium chloride  (KLOR-CON ) 10 MEQ tablet Take 1 tablet (10 mEq total) by mouth 4 (four) times daily.   predniSONE  (DELTASONE ) 5 MG tablet TAKE 1 TABLET(5 MG) BY MOUTH DAILY WITH BREAKFAST   tamsulosin  (FLOMAX ) 0.4 MG CAPS capsule Take 0.4 mg by mouth See admin instructions. Take 0.4 mg by mouth one to two times a day   tamsulosin  (FLOMAX ) 0.4 MG CAPS capsule Take 1 capsule (0.4 mg total) by mouth daily.   Tiotropium Bromide  Monohydrate (SPIRIVA  RESPIMAT) 2.5 MCG/ACT AERS Inhale 2 puffs into the lungs daily.   traZODone  (DESYREL ) 50 MG tablet TAKE 1/2 TO 1 TABLET(25 TO 50 MG) BY MOUTH AT BEDTIME AS NEEDED FOR SLEEP   triamcinolone  cream (KENALOG ) 0.1 % Apply 1 Application topically 2 (two) times daily as needed (to itchy sites).   trolamine salicylate (ASPERCREME) 10 % cream Apply 1 Application topically 2 (two) times daily as needed (arthritis pain).   ferrous sulfate  325 (65 FE) MG EC tablet Take 1 tablet (325 mg total) by mouth daily with breakfast.   morphine  (MS CONTIN ) 15 MG 12 hr tablet Take 1 tablet (15 mg total) by mouth every 12 (twelve) hours. Do Not Fill Before  06/11/23 (Patient not taking: Reported on 06/21/2023)   pantoprazole  (PROTONIX ) 40 MG tablet Take  1 tablet (40 mg total) by mouth daily. (Patient taking differently: Take 40 mg by mouth daily as needed (for heartburn).)   No facility-administered encounter medications on file as of 06/21/2023.    Allergies (verified) Tape and Celebrex [celecoxib]   History: Past Medical History:  Diagnosis Date   Abdominal aneurysm (HCC)    Arthritis    "all over" (07/19/2016)   BPH (benign prostatic hypertrophy)    CAD (coronary artery disease)    Chicken pox    Chronic lower back pain    s/p surgical fusion   Depression    Diverticulosis    Esophageal stricture    GERD (gastroesophageal reflux disease)    Hepatitis B 1984   Hiatal hernia    History of radiation therapy 01/16/18- 03/05/18   Left Tonsil, 66  Gy in 33 fractions to high risk nodal echelons.    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 Arlester Ladd and Dr Sherene Dilling    Severe aortic stenosis    TIA (transient ischemic attack) 1990s   hx   tonsillar ca dx'd 11/2017   Tubular adenoma of colon 2009   Past Surgical History:  Procedure Laterality Date   ABDOMINAL AORTIC ENDOVASCULAR STENT GRAFT N/A 03/03/2021   Procedure: ABDOMINAL AORTIC ENDOVASCULAR STENT GRAFT;  Surgeon: Adine Hoof, Brandon [name];  [facility];  Service: Vascular;  Laterality: N/A;   ABDOMINAL AORTOGRAM W/LOWER EXTREMITY N/A 04/10/2021   Procedure: ABDOMINAL AORTOGRAM W/LOWER EXTREMITY;  Surgeon: Adine Hoof, Brandon [name];  Location[facility] INVASIVE CV LAB;  Service: Cardiovascular;  Laterality: N/A;   BACK SURGERY     CARDIAC CATHETERIZATION     CATARACT EXTRACTION W/ INTRAOCULAR LENS  IMPLANT, BILATERAL Bilateral 01/2012 - 02/2012   CORONARY ATHERECTOMY N/A 04/27/2020   Procedure: CORONARY ATHERECTOMY;  Surgeon: Arnoldo Lapping, Brandon [name];  Location: [facility] INVASIVE CV LAB;  Service:  Cardiovascular;  Laterality: N/A;   CORONARY IMAGING/OCT N/A 04/27/2020   Procedure: INTRAVASCULAR IMAGING/OCT;  Surgeon: Arnoldo Lapping, Brandon [name];  Location: [facility] INVASIVE CV LAB;  Service: Cardiovascular;  Laterality: N/A;   CORONARY PRESSURE/FFR STUDY N/A 04/27/2020   Procedure: INTRAVASCULAR PRESSURE WIRE/FFR STUDY;  Surgeon: Arnoldo Lapping, Brandon [name];  Location: [facility] INVASIVE CV LAB;  Service: Cardiovascular;  Laterality: N/A;   CORONARY STENT INTERVENTION N/A 04/27/2020   Procedure: CORONARY STENT INTERVENTION;  Surgeon: Arnoldo Lapping, Brandon [name];  Location: [facility] INVASIVE CV LAB;  Service: Cardiovascular;  Laterality: N/A;   DIRECT LARYNGOSCOPY Left 10/09/2017   Procedure: DIRECT LARYNGOSCOPY WITH BOPSY;  Surgeon: Lenton Rail, Brandon [name];  Location: [facility];  Service: ENT;  Laterality: Left;   ESOPHAGOGASTRODUODENOSCOPY (EGD) WITH ESOPHAGEAL DILATION     "couple times" (07/19/2016)   ESOPHAGOSCOPY Left 10/09/2017   Procedure: ESOPHAGOSCOPY;  Surgeon: Lenton Rail, Brandon [name];  Location: [facility];  Service: ENT;  Laterality: Left;   IR GASTROSTOMY TUBE MOD SED  01/08/2018   IR THORACENTESIS ASP PLEURAL SPACE W/IMG GUIDE  07/19/2016   LAPAROSCOPIC CHOLECYSTECTOMY  1984   LUMBAR DISC SURGERY  05/1996   L4-5; Dr. Antonina Kleine LAMINECTOMY/DECOMPRESSION MICRODISCECTOMY  10/2002   L3-4. Dr. Cipriano Creeks   MULTIPLE TOOTH EXTRACTIONS  1980s   PARTIAL GLOSSECTOMY  12/02/2017   Dr. Caldwell Castles- [facility]   pharyngoplasty for closure of tingue base defect  12/02/2017   Dr. Caldwell [facility]les- [facility]   PICC LINE INSERTION  07/15/2016   POSTERIOR LUMBAR FUSION  09/1996   Ray cage, L4-5 Dr. Lawson Prey   PROSTATE BIOPSY  ~ 2017   radical tonsillectomy Left 12/02/2017   Dr. Caldwell [facility]les at [facility]   RIGHT HEART CATH AND CORONARY ANGIOGRAPHY N/A 03/31/2020   Procedure: RIGHT HEART CATH AND CORONARY ANGIOGRAPHY;  Surgeon: Brandon Eisenmenger, Brandon [name];  Location: [facility] INVASIVE CV LAB;  Service: Cardiovascular;  Laterality: N/A;    RIGID BRONCHOSCOPY Left 10/09/2017   Procedure: RIGID BRONCHOSCOPY;  Surgeon: Lenton Rail, Brandon [name];  Location: [facility];  Service: ENT;  Laterality: Left;   TEE WITHOUT CARDIOVERSION N/A 06/07/2020   Procedure: TRANSESOPHAGEAL ECHOCARDIOGRAM (TEE);  Surgeon: Arnoldo Lapping, Brandon [name];  Location: [facility] OR;  Service: Open Heart Surgery;  Laterality: N/A;  TONSILLECTOMY     TOTAL HIP ARTHROPLASTY Left 12/04/2020   Procedure: TOTAL HIP ARTHROPLASTY ANTERIOR APPROACH;  Surgeon: Adonica Hoose, Brandon [name];  Location: [facility] ORS;  Service: Orthopedics;  Laterality: Left;   TOTAL HIP ARTHROPLASTY Left 11/2020   TRACHEOSTOMY  12/02/2017   Dr. Caldwell Castles- [facility]   ULTRASOUND GUIDANCE FOR VASCULAR ACCESS Right 06/07/2020   Procedure: ULTRASOUND GUIDANCE FOR VASCULAR ACCESS;  Surgeon: Arnoldo Lapping, Brandon [name];  Location: [facility] OR;  Service: Open Heart Surgery;  Laterality: Right;   ULTRASOUND GUIDANCE FOR VASCULAR ACCESS Bilateral 03/03/2021   Procedure: ULTRASOUND GUIDANCE FOR VASCULAR ACCESS, BILATERAL FEMORAL ARTERIES;  Surgeon: Adine Hoof, Brandon [name];  Location: [facility] OR;  Service: Vascular;  Laterality: Bilateral;   VASCULAR SURGERY     Family History  Problem Relation Age of Onset   Heart disease Father 2       Living   Coronary artery disease Father        CABG   Alzheimer's disease Mother 76       Deceased   Arthritis Mother    Aneurysm Brother    Stomach cancer Maternal Uncle    Brain cancer Maternal Aunt        x2   Obesity Daughter        Had Bypass Sx   Social History   Socioeconomic History   Marital status: Married    Spouse name: Brandon [name]   Number of children: 2   Years of education: Not on file   Highest education level: Not on file  Occupational History   Occupation: retired    Associate Professor: RETIRED    Comment: disabled due to back problems  Tobacco Use   Smoking status: Former    Current packs/day: 0.00    Average packs/day: 1.5 packs/day for 50.0 years (75.0 ttl pk-yrs)    Types:  Cigarettes    Start date: 09/06/1967    Quit date: 09/05/2017    Years since quitting: 5.7    Passive exposure: Never   Smokeless tobacco: Never   Tobacco comments:    smoked less than 1 ppd for 40+ years;   Vaping Use   Vaping status: Never Used  Substance and Sexual Activity   Alcohol  use: Not Currently    Comment: rarely   Drug use: Yes    Types: Oxycodone , Morphine     Comment: has prescription   Sexual activity: Not Currently  Other Topics Concern   Not on file  Social History Narrative   ** Merged History Encounter **       Married (3rd), [place]. 2 children from 1st marriage, 4 step children.    Retired on disability due to back    Former Runner, broadcasting/film/video.   restores antique furniture for a hobby.       Cell # [phone]   Social Drivers of Health   Financial Resource Strain: Low Risk  (06/21/2023)   Overall Financial Resource Strain (CARDIA)    Difficulty of Paying Living Expenses: Not hard at all  Food Insecurity: No Food Insecurity (06/21/2023)   Hunger Vital Sign    Worried About Running Out of Food in the Last Year: Never true    Ran Out of Food in the Last Year: Never true  Transportation Needs: No Transportation Needs (06/21/2023)   PRAPARE - Administrator, Civil Service (Medical): No    Lack of Transportation (Non-Medical): No  Physical Activity: Inactive (06/21/2023)   Exercise Vital Sign    Days of Exercise  per Week: 0 days    Minutes of Exercise per Session: 0 min  Stress: No Stress Concern Present (06/21/2023)   Brandon [name] [employer] Occupational [employer] - Occupational Stress Questionnaire    Feeling of Stress : Not at all  Social Connections: Moderately Integrated (06/21/2023)   Social Connection and Isolation Panel [NHANES]    Frequency of Communication with Friends and Family: More than three times a week    Frequency of Social Gatherings with Friends and Family: Three times a week    Attends Religious Services: More than 4  times per year    Active Member of Clubs or Organizations: No    Attends Banker Meetings: Never    Marital Status: Married    Tobacco Counseling Counseling given: Not Answered Tobacco comments: smoked less than 1 ppd for 40+ years;     Clinical Intake:  Pre-visit preparation completed: Yes  Pain : No/denies pain     Nutritional Risks: None Diabetes: No  Lab Results  Component Value Date   HGBA1C 6.3 02/11/2023   HGBA1C 5.4 01/22/2019   HGBA1C 5.9 02/15/2016     How often do you need to have someone help you when you read instructions, pamphlets, or other written materials from your doctor or pharmacy?: 1 - Never  Interpreter Needed?: No  Information entered by :: Brandon [name]   Activities of Daily Living     06/21/2023    9:29 AM  In your present state of health, do you have any difficulty performing the following activities:  Hearing? 1  Comment has hearing aids, but does not wear all the time  Vision? 0  Difficulty concentrating or making decisions? 1  Comment short term memory  Walking or climbing stairs? 1  Dressing or bathing? 0  Doing errands, shopping? 0  Preparing Food and eating ? N  Using the Toilet? N  In the past six months, have you accidently leaked urine? N  Do you have problems with loss of bowel control? N  Managing your Medications? N  Managing your Finances? N  Housekeeping or managing your Housekeeping? N    Patient Care Team: Brandon Lawyer, Brandon [name] as PCP - General (Family Medicine) Brandon Eisenmenger, Brandon [name] as PCP - Cardiology (Cardiology) Brandon Eisenmenger, Brandon [name] as PCP - Advanced Heart Failure (Cardiology) Brandon Blacksmith, Brandon [name] (Inactive) as Consulting Physician (Gastroenterology) Brandon Herder, Brandon [name] as Consulting Physician (Neurosurgery) Brandon Dawes, Brandon [name] as Attending Physician (Radiation Oncology) Brandon [name], Brandon [name] as Dietitian (Nutrition) Brandon [name], Brandon [name], Brandon [name] as Physical Therapist (Physical Therapy) Brandon [name], Brandon [name] as Speech Language Pathologist (Speech Pathology) Brandon [name] Busing, LCSW as Social Worker Adelbert Homans, Brandon [name] as Consulting Physician (Urology) Neil Balls, Brandon [name] as Consulting Physician (Orthopedic Surgery) Adine Hoof, Brandon [name] as Consulting Physician (Vascular Surgery) Celia Coles, Brandon [name] as Consulting Physician (Physical Medicine and Rehabilitation) Remer Cargo, Brandon [name] as Referring Physician (Otolaryngology)  Indicate any recent Medical Services you may have received from other than [facility] providers in the past year (date may be approximate).     Assessment:    This is a routine wellness examination for Brandon [name].  Hearing/Vision screen Hearing Screening - Comments:: Has hearing aids that he does not use often Vision Screening - Comments:: Regular eye exams, Dr. Terrall Ferraris   Goals Addressed             This Visit's Progress    Patient Stated       06/21/2023, wants to get shoulder better  Depression Screen     06/21/2023    9:40 AM 05/15/2023   11:10 AM 01/08/2023    9:52 AM 09/24/2022   11:03 AM 07/16/2022   11:23 AM 06/26/2022    8:06 AM 06/15/2022    9:49 AM  PHQ 2/9 Scores  PHQ - 2 Score 0 0 0 0 3 2 0  PHQ- 9 Score 4     14     Fall Risk     06/21/2023    9:39 AM 05/15/2023   11:10 AM 02/11/2023   10:52 AM 01/08/2023    9:52 AM 09/24/2022   11:03 AM  Fall Risk   Falls in the past year? 0 0 0 0 0  Number falls in past yr: 0  0  0  Injury with Fall? 0  0  0  Risk for fall due to : Medication side effect  No Fall Risks    Follow up Falls prevention discussed;Falls evaluation completed  Falls evaluation completed      MEDICARE RISK AT HOME:  Medicare Risk at Home Any stairs in or around the home?: Yes If so, are there any without handrails?: No Home free of loose throw rugs in walkways, pet beds, electrical cords, etc?: Yes Adequate lighting in your home to reduce risk of falls?: Yes Life alert?: No Use of a cane, walker or w/c?: Yes  (sometimes) Grab bars in the bathroom?: Yes Shower chair or bench in shower?: Yes Elevated toilet seat or a handicapped toilet?: Yes  TIMED UP AND GO:  Was the test performed?  No  Cognitive Function: 6CIT completed    05/30/2017   10:05 AM  MMSE - Mini Mental State Exam  Orientation to time 5  Orientation to Place 5  Registration 3  Attention/ Calculation 5  Recall 3  Language- name 2 objects 2  Language- repeat 1  Language- follow 3 step command 3  Language- read & follow direction 1  Write a sentence 1  Copy design 1  Total score 30        06/21/2023    9:41 AM 06/15/2022    9:51 AM  6CIT Screen  What Year? 0 points 0 points  What month? 0 points 0 points  What time? 0 points 0 points  Count back from 20 0 points 0 points  Months in reverse 4 points 4 points  Repeat phrase 0 points 6 points  Total Score 4 points 10 points    Immunizations Immunization History  Administered Date(s) Administered   Fluad Quad(high Dose 65+) 10/15/2018, 11/06/2019, 10/20/2020   Influenza Split 11/26/2010, 10/11/2011, 11/03/2012   Influenza Whole 11/14/2006, 12/04/2007, 10/06/2008, 10/25/2009, 11/26/2021   Influenza, High Dose Seasonal PF 11/01/2016, 10/30/2017   Influenza, Seasonal, Injecte, Preservative Fre 12/07/2014   Influenza,inj,Quad PF,6+ Mos 11/04/2015   Influenza-Unspecified 11/19/2013, 11/06/2022   PFIZER(Purple Top)SARS-COV-2 Vaccination 02/25/2019, 03/18/2019, 11/16/2019   Pneumococcal Conjugate-13 12/07/2014   Pneumococcal Polysaccharide-23 05/30/2017   Tdap 01/28/2020    Screening Tests Health Maintenance  Topic Date Due   Zoster Vaccines- Shingrix (1 of 2) Never done   Colonoscopy  03/22/2020   Lung Cancer Screening  08/18/2022   COVID-19 Vaccine (4 - 2024-25 season) 10/07/2022   INFLUENZA VACCINE  09/06/2023   Medicare Annual Wellness (AWV)  06/20/2024   DTaP/Tdap/Td (2 - Td or Tdap) 01/27/2030   Pneumonia Vaccine 49+ Years old  Completed   HPV VACCINES   Aged Out   Meningococcal B Vaccine  Aged Out  Hepatitis C Screening  Discontinued    Health Maintenance  Health Maintenance Due  Topic Date Due   Zoster Vaccines- Shingrix (1 of 2) Never done   Colonoscopy  03/22/2020   Lung Cancer Screening  08/18/2022   COVID-19 Vaccine (4 - 2024-25 season) 10/07/2022   Health Maintenance Items Addressed: Thinking about shingrix. Declines covid vaccine. Declines colonoscopy.  Additional Screening:  Vision Screening: Recommended annual ophthalmology exams for early detection of glaucoma and other disorders of the eye.  Dental Screening: Recommended annual dental exams for proper oral hygiene  Community Resource Referral / Chronic Care Management: CRR required this visit?  No   CCM required this visit?  No   Plan:    I have personally reviewed and noted the following in the patient's chart:   Medical and social history Use of alcohol , tobacco or illicit drugs  Current medications and supplements including opioid prescriptions. Patient is not currently taking opioid prescriptions. Functional ability and status Nutritional status Physical activity Advanced directives List of other physicians Hospitalizations, surgeries, and ER visits in previous 12 months Vitals Screenings to include cognitive, depression, and falls Referrals and appointments  In addition, I have reviewed and discussed with patient certain preventive protocols, quality metrics, and best practice recommendations. A written personalized care plan for preventive services as well as general preventive health recommendations were provided to patient.   Areatha Beecham, [name]   05/14/8117   After Visit Summary: (MyChart) Due to this being a telephonic visit, the after visit summary with patients personalized plan was offered to patient via MyChart   Notes: Nothing significant to report at this time.

## 2023-06-21 NOTE — Patient Instructions (Signed)
 Brandon [name] , Thank you for taking time out of your busy schedule to complete your Annual Wellness Visit with me. I enjoyed our conversation and look forward to speaking with you again next year. I, as well as your care team,  appreciate your ongoing commitment to your health goals. Please review the following plan we discussed and let me know if I can assist you in the future. Your Game plan/ To Do List    Referrals: If you haven't heard from the office you've been referred to, please reach out to them at the phone provided.  N/a Follow up Visits: Next Medicare AWV with our clinical staff: 06/22/2024 at 9:30   Have you seen your provider in the last 6 months (3 months if uncontrolled diabetes)? Yes Next Office Visit with your provider: 07/05/2023 at 3:20  Clinician Recommendations:  Aim for 30 minutes of exercise or brisk walking, 6-8 glasses of water , and 5 servings of fruits and vegetables each day.       This is a list of the screening recommended for you and due dates:  Health Maintenance  Topic Date Due   Zoster (Shingles) Vaccine (1 of 2) Never done   Colon Cancer Screening  03/22/2020   Screening for Lung Cancer  08/18/2022   COVID-19 Vaccine (4 - 2024-25 season) 10/07/2022   Flu Shot  09/06/2023   Medicare Annual Wellness Visit  06/20/2024   DTaP/Tdap/Td vaccine (2 - Td or Tdap) 01/27/2030   Pneumonia Vaccine  Completed   HPV Vaccine  Aged Out   Meningitis B Vaccine  Aged Out   Hepatitis C Screening  Discontinued    Advanced directives: (In Chart) A copy of your advanced directives are scanned into your chart should your provider ever need it. Advance Care Planning is important because it:  [x]  Makes sure you receive the medical care that is consistent with your values, goals, and preferences  [x]  It provides guidance to your family and loved ones and reduces their decisional burden about whether or not they are making the right decisions based on your wishes.  Follow the link  provided in your after visit summary or read over the paperwork we have mailed to you to help you started getting your Advance Directives in place. If you need assistance in completing these, please reach out to us  so that we can help you!  See attachments for Preventive Care and Fall Prevention Tips.

## 2023-06-24 ENCOUNTER — Encounter: Payer: Self-pay | Admitting: Physician Assistant

## 2023-06-24 ENCOUNTER — Other Ambulatory Visit: Payer: Self-pay

## 2023-06-24 ENCOUNTER — Ambulatory Visit: Attending: Physician Assistant | Admitting: Physician Assistant

## 2023-06-24 VITALS — BP 104/54 | HR 59 | Resp 15 | Ht 68.5 in | Wt 140.4 lb

## 2023-06-24 DIAGNOSIS — I7143 Infrarenal abdominal aortic aneurysm, without rupture: Secondary | ICD-10-CM

## 2023-06-24 DIAGNOSIS — Z7952 Long term (current) use of systemic steroids: Secondary | ICD-10-CM

## 2023-06-24 DIAGNOSIS — I10 Essential (primary) hypertension: Secondary | ICD-10-CM

## 2023-06-24 DIAGNOSIS — M19042 Primary osteoarthritis, left hand: Secondary | ICD-10-CM

## 2023-06-24 DIAGNOSIS — Z96642 Presence of left artificial hip joint: Secondary | ICD-10-CM

## 2023-06-24 DIAGNOSIS — C090 Malignant neoplasm of tonsillar fossa: Secondary | ICD-10-CM

## 2023-06-24 DIAGNOSIS — L409 Psoriasis, unspecified: Secondary | ICD-10-CM

## 2023-06-24 DIAGNOSIS — F32A Depression, unspecified: Secondary | ICD-10-CM

## 2023-06-24 DIAGNOSIS — G8929 Other chronic pain: Secondary | ICD-10-CM

## 2023-06-24 DIAGNOSIS — Z8639 Personal history of other endocrine, nutritional and metabolic disease: Secondary | ICD-10-CM

## 2023-06-24 DIAGNOSIS — E559 Vitamin D deficiency, unspecified: Secondary | ICD-10-CM

## 2023-06-24 DIAGNOSIS — Z952 Presence of prosthetic heart valve: Secondary | ICD-10-CM

## 2023-06-24 DIAGNOSIS — G894 Chronic pain syndrome: Secondary | ICD-10-CM

## 2023-06-24 DIAGNOSIS — Z87891 Personal history of nicotine dependence: Secondary | ICD-10-CM

## 2023-06-24 DIAGNOSIS — M25512 Pain in left shoulder: Secondary | ICD-10-CM | POA: Diagnosis not present

## 2023-06-24 DIAGNOSIS — M810 Age-related osteoporosis without current pathological fracture: Secondary | ICD-10-CM

## 2023-06-24 DIAGNOSIS — M51369 Other intervertebral disc degeneration, lumbar region without mention of lumbar back pain or lower extremity pain: Secondary | ICD-10-CM

## 2023-06-24 DIAGNOSIS — M19041 Primary osteoarthritis, right hand: Secondary | ICD-10-CM | POA: Diagnosis not present

## 2023-06-24 DIAGNOSIS — M1A0210 Idiopathic chronic gout, right elbow, without tophus (tophi): Secondary | ICD-10-CM

## 2023-06-24 DIAGNOSIS — M353 Polymyalgia rheumatica: Secondary | ICD-10-CM | POA: Diagnosis not present

## 2023-06-24 DIAGNOSIS — R1312 Dysphagia, oropharyngeal phase: Secondary | ICD-10-CM

## 2023-06-24 DIAGNOSIS — F419 Anxiety disorder, unspecified: Secondary | ICD-10-CM

## 2023-06-24 DIAGNOSIS — Z862 Personal history of diseases of the blood and blood-forming organs and certain disorders involving the immune mechanism: Secondary | ICD-10-CM

## 2023-06-24 DIAGNOSIS — Z8719 Personal history of other diseases of the digestive system: Secondary | ICD-10-CM

## 2023-06-24 MED ORDER — PREDNISONE 1 MG PO TABS: ORAL_TABLET | ORAL | 0 refills | Status: DC

## 2023-06-24 NOTE — Telephone Encounter (Signed)
 Please review and sign pended rx for prednisone  1mg  tablet taper. Spoke with patient and he has plenty of 5mg  tablets and will contact the pharmacy when he is in need of a refill. Thanks!

## 2023-07-02 ENCOUNTER — Telehealth: Payer: Self-pay | Admitting: Rheumatology

## 2023-07-02 NOTE — Telephone Encounter (Signed)
 Patient left a voicemail requesting prescription refill   1. Name of Medication: Prednisone  1 mg tablet  2. How are you currently taking this medication (dosage and times per day)?  8 mg per day   3. What pharmacy would you like for that to be sent to?  Patient did not leave that information on voicemail

## 2023-07-02 NOTE — Telephone Encounter (Signed)
 Prescription for Prednisone  1 mg tablets sent to the pharmacy on 06/24/2023. Reached out to patient to advised. He will reach out to the pharmacy.

## 2023-07-05 ENCOUNTER — Ambulatory Visit: Admitting: Nurse Practitioner

## 2023-07-08 ENCOUNTER — Encounter: Payer: Self-pay | Admitting: Family Medicine

## 2023-07-08 ENCOUNTER — Other Ambulatory Visit: Payer: Self-pay | Admitting: Family Medicine

## 2023-07-08 ENCOUNTER — Ambulatory Visit (INDEPENDENT_AMBULATORY_CARE_PROVIDER_SITE_OTHER): Admitting: Family Medicine

## 2023-07-08 VITALS — BP 124/60 | HR 77 | Temp 97.8°F | Ht 68.5 in | Wt 143.2 lb

## 2023-07-08 DIAGNOSIS — K2100 Gastro-esophageal reflux disease with esophagitis, without bleeding: Secondary | ICD-10-CM | POA: Diagnosis not present

## 2023-07-08 DIAGNOSIS — R634 Abnormal weight loss: Secondary | ICD-10-CM | POA: Diagnosis not present

## 2023-07-08 DIAGNOSIS — N400 Enlarged prostate without lower urinary tract symptoms: Secondary | ICD-10-CM

## 2023-07-08 MED ORDER — MIRTAZAPINE 7.5 MG PO TABS: 7.5000 mg | ORAL_TABLET | Freq: Every day | ORAL | 3 refills | Status: DC

## 2023-07-08 MED ORDER — PANTOPRAZOLE SODIUM 40 MG PO TBEC: 40.0000 mg | DELAYED_RELEASE_TABLET | Freq: Every day | ORAL | 3 refills | Status: AC

## 2023-07-08 NOTE — Progress Notes (Signed)
 [facility] [address] [place] [address] Dept: [phone] Dept Fax: [phone]  Chronic Care Office Visit  Subjective:    Patient ID: Brandon [name], male    DOB: [birth year], 83 y.o..   MRN: [id number]  Chief Complaint  Patient presents with   Follow-up    F/u  to discuss not having an appetite.    History of Present Illness:  Patient is in today for reassessment of chronic medical issues.  [name] notes concerns with lack of appetite. He has been experiencing some gradual weight loss. He is now taking some protein drinks which has increased his weight a small amount. [name] denies any difficulty with early satiety or swallowing. He odes note his dentures do not fit well and he does get sore gums occasionally. He notes his appetite is poorest in the morning. He does typically eat some lunch and dinner.  Past Medical History: Patient Active Problem List   Diagnosis Date Noted   Primary osteoarthritis, left shoulder 02/27/2023   Anemia of chronic disease 02/11/2023   Hyperglycemia 02/11/2023   Vitamin B12 deficiency 02/11/2023   Age related osteoporosis 08/31/2022   Malnutrition of mild degree (HCC) 08/01/2022   Insomnia 06/26/2022   Arthritis of left hand 06/26/2022   Weight loss 06/26/2022   Chronic gout of multiple sites 04/30/2022   Chronically on opiate therapy 02/12/2022   Sacroiliac joint pain 02/12/2022   At risk for polypharmacy 01/19/2022   Trochanteric bursitis of left hip 01/02/2022   Dysfunction of left eustachian tube 12/05/2021   Hypotension 08/18/2021   Hyponatremia 06/26/2021   Hyperkalemia 06/26/2021   Acute metabolic encephalopathy 06/26/2021   Myoclonic jerking 06/26/2021   Lumbar radiculopathy 06/08/2021   Lumbar postlaminectomy syndrome 05/03/2021   Degeneration of lumbar intervertebral disc 05/03/2021   Lumbar spondylosis 05/03/2021   History of hip fracture 04/24/2021   S/P  abdominal aortic aneurysm repair 04/24/2021   Chronic allergic rhinitis 01/24/2021   Demand ischemia (HCC)    Elevated troponin    Chronic diastolic heart failure (HCC) 12/03/2020   Steatosis of liver 11/01/2020   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   Hepatitis B core antibody positive 11/01/2020   Benign prostatic hyperplasia 11/01/2020   Chronic obstructive pulmonary disease (HCC) 07/20/2020   History of placement of stent in LAD coronary artery 07/20/2020   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   TIA (transient ischemic attack)    Esophageal stricture    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   Primary osteoarthritis of both hands 01/11/2020   Piriformis syndrome of both sides 09/23/2019   Sciatica associated with disorder of lumbar spine 04/24/2019   Primary osteoarthritis involving multiple joints 04/24/2019   Chronic pain syndrome 04/24/2019   Mixed anxiety and depressive disorder 04/15/2018   Oropharyngeal dysphagia 03/11/2018   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   Tobacco abuse disorder 01/10/2014   Essential hypertension 05/19/2013   Psoriasis 01/13/2009   Coronary artery disease 09/15/2008   Diverticulosis of colon 08/27/2007   History of benign prostatic hyperplasia 08/27/2007   Hyperlipidemia 03/19/2007   Gastroesophageal reflux disease 03/19/2007   Past Surgical History:  Procedure Laterality Date   ABDOMINAL AORTIC ENDOVASCULAR STENT GRAFT N/A 03/03/2021   Procedure: ABDOMINAL AORTIC ENDOVASCULAR STENT GRAFT;  Surgeon: Adine Hoof, MD;  Location: [facility] OR;  Service: Vascular;  Laterality: N/A;   ABDOMINAL AORTOGRAM W/LOWER EXTREMITY N/A 04/10/2021   Procedure: ABDOMINAL AORTOGRAM W/LOWER EXTREMITY;  Surgeon: Vikki Graves,  Antony Baumgartner, MD;  Location: [facility] INVASIVE CV LAB;  Service: Cardiovascular;  Laterality: N/A;   BACK SURGERY     CARDIAC CATHETERIZATION     CATARACT EXTRACTION W/ INTRAOCULAR LENS  IMPLANT,  BILATERAL Bilateral 01/2012 - 02/2012   CORONARY ATHERECTOMY N/A 04/27/2020   Procedure: CORONARY ATHERECTOMY;  Surgeon: Arnoldo Lapping, MD;  Location: [facility] INVASIVE CV LAB;  Service: Cardiovascular;  Laterality: N/A;   CORONARY IMAGING/OCT N/A 04/27/2020   Procedure: INTRAVASCULAR IMAGING/OCT;  Surgeon: Arnoldo Lapping, MD;  Location: [facility] INVASIVE CV LAB;  Service: Cardiovascular;  Laterality: N/A;   CORONARY PRESSURE/FFR STUDY N/A 04/27/2020   Procedure: INTRAVASCULAR PRESSURE WIRE/FFR STUDY;  Surgeon: Arnoldo Lapping, MD;  Location: [facility] INVASIVE CV LAB;  Service: Cardiovascular;  Laterality: N/A;   CORONARY STENT INTERVENTION N/A 04/27/2020   Procedure: CORONARY STENT INTERVENTION;  Surgeon: Arnoldo Lapping, MD;  Location: [facility] INVASIVE CV LAB;  Service: Cardiovascular;  Laterality: N/A;   DIRECT LARYNGOSCOPY Left 10/09/2017   Procedure: DIRECT LARYNGOSCOPY WITH BOPSY;  Surgeon: Lenton Rail, MD;  Location: [facility];  Service: ENT;  Laterality: Left;   ESOPHAGOGASTRODUODENOSCOPY (EGD) WITH ESOPHAGEAL DILATION     "couple times" (07/19/2016)   ESOPHAGOSCOPY Left 10/09/2017   Procedure: ESOPHAGOSCOPY;  Surgeon: Lenton Rail, MD;  Location: [facility];  Service: ENT;  Laterality: Left;   IR GASTROSTOMY TUBE MOD SED  01/08/2018   IR THORACENTESIS ASP PLEURAL SPACE W/IMG GUIDE  07/19/2016   LAPAROSCOPIC CHOLECYSTECTOMY  1984   LUMBAR DISC SURGERY  05/1996   L4-5; Dr. Antonina Kleine LAMINECTOMY/DECOMPRESSION MICRODISCECTOMY  10/2002   L3-4. Dr. Cipriano Creeks   MULTIPLE TOOTH EXTRACTIONS  1980s   PARTIAL GLOSSECTOMY  12/02/2017   Dr. Caldwell Castles- [facility]   pharyngoplasty for closure of tingue base defect  12/02/2017   Dr. Caldwell Castles- [facility]   PICC LINE INSERTION  07/15/2016   POSTERIOR LUMBAR FUSION  09/1996   Ray cage, L4-5 Dr. Lawson Prey   PROSTATE BIOPSY  ~ 2017   radical tonsillectomy Left 12/02/2017   Dr. Caldwell Castles at [facility]   RIGHT HEART CATH AND CORONARY ANGIOGRAPHY  N/A 03/31/2020   Procedure: RIGHT HEART CATH AND CORONARY ANGIOGRAPHY;  Surgeon: Darlis Eisenmenger, MD;  Location: [facility] INVASIVE CV LAB;  Service: Cardiovascular;  Laterality: N/A;   RIGID BRONCHOSCOPY Left 10/09/2017   Procedure: RIGID BRONCHOSCOPY;  Surgeon: Lenton Rail, MD;  Location: [facility];  Service: ENT;  Laterality: Left;   TEE WITHOUT CARDIOVERSION N/A 06/07/2020   Procedure: TRANSESOPHAGEAL ECHOCARDIOGRAM (TEE);  Surgeon: Arnoldo Lapping, MD;  Location: [facility] OR;  Service: Open Heart Surgery;  Laterality: N/A;   TONSILLECTOMY     TOTAL HIP ARTHROPLASTY Left 12/04/2020   Procedure: TOTAL HIP ARTHROPLASTY ANTERIOR APPROACH;  Surgeon: Adonica Hoose, MD;  Location: [facility] ORS;  Service: Orthopedics;  Laterality: Left;   TOTAL HIP ARTHROPLASTY Left 11/2020   TRACHEOSTOMY  12/02/2017   Dr. Caldwell Castles- [facility]   ULTRASOUND GUIDANCE FOR VASCULAR ACCESS Right 06/07/2020   Procedure: ULTRASOUND GUIDANCE FOR VASCULAR ACCESS;  Surgeon: Arnoldo Lapping, MD;  Location: [facility] OR;  Service: Open Heart Surgery;  Laterality: Right;   ULTRASOUND GUIDANCE FOR VASCULAR ACCESS Bilateral 03/03/2021   Procedure: ULTRASOUND GUIDANCE FOR VASCULAR ACCESS, BILATERAL FEMORAL ARTERIES;  Surgeon: Adine Hoof, MD;  Location: [facility] OR;  Service: Vascular;  Laterality: Bilateral;   VASCULAR SURGERY     Family History  Problem Relation Age of Onset   Heart disease Father 75  Living   Coronary artery disease Father        CABG   Alzheimer's disease Mother 4       Deceased   Arthritis Mother    Aneurysm Brother    Stomach cancer Maternal Uncle    Brain cancer Maternal Aunt        x2   Obesity Daughter        Had Bypass Sx   Outpatient Medications Prior to Visit  Medication Sig Dispense Refill   acetaminophen  (TYLENOL ) 500 MG tablet Take 1,000 mg by mouth every 8 (eight) hours as needed for mild pain or headache.     albuterol  (VENTOLIN  HFA) 108 (90 Base) MCG/ACT inhaler Inhale 2 puffs  into the lungs every 6 (six) hours as needed for wheezing or shortness of breath. 6.7 g 2   allopurinol  (ZYLOPRIM ) 100 MG tablet Take 2 tablets (200 mg total) by mouth daily. 180 tablet 1   amLODipine  (NORVASC ) 10 MG tablet Take 1 tablet (10 mg total) by mouth daily. 90 tablet 3   aspirin  EC 81 MG tablet Take 81 mg by mouth at bedtime. Swallow whole.     atorvastatin  (LIPITOR ) 80 MG tablet Take 1 tablet (80 mg total) by mouth daily. 90 tablet 3   carvedilol  (COREG ) 12.5 MG tablet Take 1 tablet (12.5 mg total) by mouth 2 (two) times daily. 180 tablet 3   cyanocobalamin  (VITAMIN B12) 1000 MCG tablet Take 1 tablet (1,000 mcg total) by mouth daily. 90 tablet 3   dorzolamide -timolol  (COSOPT ) 2-0.5 % ophthalmic solution Place 1 drop into the left eye 2 (two) times daily. (Patient taking differently: Place 1 drop into the left eye as needed.) 1 mL 12   ezetimibe  (ZETIA ) 10 MG tablet TAKE 1 TABLET ONCE DAILY 90 tablet 3   feeding supplement (BOOST HIGH PROTEIN) LIQD Take 1 Container by mouth 3 (three) times daily between meals.     FLUoxetine  (PROZAC ) 20 MG capsule Take 1 capsule (20 mg total) by mouth daily. 90 capsule 3   fluticasone  (FLONASE ) 50 MCG/ACT nasal spray SHAKE LIQUID AND USE 2 SPRAYS IN EACH NOSTRIL DAILY AS NEEDED FOR RHINITIS OR ALLERGIES (Patient taking differently: Place 2 sprays into both nostrils daily.) 16 g 11   furosemide  (LASIX ) 20 MG tablet Take 1 tablet (20 mg total) by mouth daily as needed for fluid or edema. (Patient taking differently: Take 20 mg by mouth as needed for fluid or edema.) 30 tablet 3   gabapentin  (NEURONTIN ) 300 MG capsule Take 1 capsule (300 mg total) by mouth 2 (two) times daily. 60 capsule 2   guaiFENesin  (MUCINEX ) 600 MG 12 hr tablet Take 600 mg by mouth 2 (two) times daily as needed for cough or to loosen phlegm.     hydrocortisone  cream 0.5 % Apply 1 Application topically as needed.     hydrOXYzine  (ATARAX ) 10 MG tablet Take 1-2 tablets (10-20 mg total) by  mouth at bedtime as needed for itching (or sleep). 30 tablet 3   ipratropium (ATROVENT ) 0.02 % nebulizer solution Use 1 vial (0.5 mg total) by nebulization every 6 (six) hours as needed for wheezing or shortness of breath. 75 mL 12   morphine  (MS CONTIN ) 15 MG 12 hr tablet Take 1 tablet (15 mg total) by mouth every 12 (twelve) hours. Do Not Fill Before  06/11/23 60 tablet 0   naloxone  (NARCAN ) nasal spray 4 mg/0.1 mL For possible accidentally overdose. 2 each 5   oxyCODONE  10 MG TABS Take  1 tablet (10 mg total) by mouth every 4 (four) hours as needed for severe pain (pain score 7-10). 150 tablet 0   potassium chloride  (KLOR-CON ) 10 MEQ tablet Take 1 tablet (10 mEq total) by mouth 4 (four) times daily. 120 tablet 3   predniSONE  (DELTASONE ) 1 MG tablet (Along with a 5mg  tablet)--Take 5 tabs by mouth daily for 1 week, take 4 tabs by mouth daily for 1 week then take 3 tablets by mouth daily. 105 tablet 0   predniSONE  (DELTASONE ) 5 MG tablet TAKE 1 TABLET(5 MG) BY MOUTH DAILY WITH BREAKFAST 90 tablet 0   tamsulosin  (FLOMAX ) 0.4 MG CAPS capsule TAKE 1 CAPSULE(0.4 MG) BY MOUTH DAILY 90 capsule 3   Tiotropium Bromide  Monohydrate (SPIRIVA  RESPIMAT) 2.5 MCG/ACT AERS Inhale 2 puffs into the lungs daily. 4 g 6   traZODone  (DESYREL ) 50 MG tablet TAKE 1/2 TO 1 TABLET(25 TO 50 MG) BY MOUTH AT BEDTIME AS NEEDED FOR SLEEP (Patient taking differently: as needed.) 30 tablet 3   triamcinolone  cream (KENALOG ) 0.1 % Apply 1 Application topically 2 (two) times daily as needed (to itchy sites).     trolamine salicylate (ASPERCREME) 10 % cream Apply 1 Application topically 2 (two) times daily as needed (arthritis pain).     tamsulosin  (FLOMAX ) 0.4 MG CAPS capsule Take 0.4 mg by mouth See admin instructions. Take 0.4 mg by mouth one to two times a day     ferrous sulfate  325 (65 FE) MG EC tablet Take 1 tablet (325 mg total) by mouth daily with breakfast. 30 tablet 0   pantoprazole  (PROTONIX ) 40 MG tablet Take 1 tablet (40 mg  total) by mouth daily. (Patient taking differently: Take 40 mg by mouth daily as needed (for heartburn).) 30 tablet 2   No facility-administered medications prior to visit.   Allergies  Allergen Reactions   Tape Other (See Comments)    Tears skin. Prefers paper tape, Please   Celebrex [Celecoxib] Hives and Itching   Objective:   Today's Vitals   07/08/23 1307  BP: 124/60  Pulse: 77  Temp: 97.8 F (36.6 C)  TempSrc: Temporal  SpO2: 94%  Weight: 143 lb 3.2 oz (65 kg)  Height: 5' 8.5" (1.74 m)   Body mass index is 21.46 kg/m.     General: Well developed, well nourished. No acute distress. Psych: Alert and oriented. Normal mood and affect.  Health Maintenance Due  Topic Date Due   Zoster Vaccines- Shingrix (1 of 2) Never done   Colonoscopy  03/22/2020   Lung Cancer Screening  08/18/2022   COVID-19 Vaccine (4 - 2024-25 season) 10/07/2022     Assessment & Plan:   Problem List Items Addressed This Visit       Digestive   Gastroesophageal reflux disease   No signs of acid reflux or dysphagia. Continue pantoprazole  40 mg daily.      Relevant Medications   pantoprazole  (PROTONIX ) 40 MG tablet     Other   Weight loss - Primary   Mr. Buckles weight has increased mildly over the past several weeks. Review of recent labs shows no sign of underlying medical cause for weight loss. Likely this is multifactorial. I will try him on a low dose of mirtazapine  to see if this will increase his appetite some.      Relevant Medications   mirtazapine  (REMERON ) 7.5 MG tablet    Return in about 6 weeks (around 08/19/2023).   Graig Lawyer, MD

## 2023-07-08 NOTE — Assessment & Plan Note (Signed)
 Brandon [name] weight has increased mildly over the past several weeks. Review of recent labs shows no sign of underlying medical cause for weight loss. Likely this is multifactorial. I will try him on a low dose of mirtazapine  to see if this will increase his appetite some.

## 2023-07-08 NOTE — Assessment & Plan Note (Signed)
 No signs of acid reflux or dysphagia. Continue pantoprazole  40 mg daily.

## 2023-07-09 ENCOUNTER — Telehealth: Payer: Self-pay | Admitting: Rheumatology

## 2023-07-09 NOTE — Telephone Encounter (Signed)
 Attempted to contact the patient and left message to advise patient I spoke with the pharmacy staff and they are currently getting his prescription ready.

## 2023-07-09 NOTE — Telephone Encounter (Signed)
 Patient contacted the office to request a medication refill.   1. Name of Medication: Prednisone   2. How are you currently taking this medication (dosage and times per day)? 8Mg  will take this with his 5mg  prednisone  and then reduce it down    3. What pharmacy would you like for that to be sent to? [facility] [address]    Pt stated [facility] did not have anything for him. Pt stated he was seen 06/24/23. Pt is requesting a call back once it is gone through

## 2023-07-10 ENCOUNTER — Other Ambulatory Visit: Payer: Self-pay

## 2023-07-11 ENCOUNTER — Telehealth: Payer: Self-pay | Admitting: Registered Nurse

## 2023-07-11 NOTE — Telephone Encounter (Signed)
 Good Morning Ladies,  I received a refill request for Gabapentin . My note in February stated that [name] stopped taking his gabapentin  because it was ineffective. Please Give him a call to clarify, in the meantime the prescription will be denied, and will ask for the patient to call. I reviewed Dr Raynaldo Call note from April and she doesn't mention the gabapentin .  Thanks

## 2023-07-12 ENCOUNTER — Encounter: Attending: Physical Medicine and Rehabilitation | Admitting: Registered Nurse

## 2023-07-12 ENCOUNTER — Encounter: Payer: Self-pay | Admitting: Registered Nurse

## 2023-07-12 VITALS — BP 156/70 | HR 73 | Ht 68.5 in | Wt 144.0 lb

## 2023-07-12 DIAGNOSIS — Z79891 Long term (current) use of opiate analgesic: Secondary | ICD-10-CM | POA: Insufficient documentation

## 2023-07-12 DIAGNOSIS — G8929 Other chronic pain: Secondary | ICD-10-CM | POA: Insufficient documentation

## 2023-07-12 DIAGNOSIS — Z5181 Encounter for therapeutic drug level monitoring: Secondary | ICD-10-CM | POA: Insufficient documentation

## 2023-07-12 DIAGNOSIS — G894 Chronic pain syndrome: Secondary | ICD-10-CM | POA: Diagnosis not present

## 2023-07-12 DIAGNOSIS — G2581 Restless legs syndrome: Secondary | ICD-10-CM | POA: Insufficient documentation

## 2023-07-12 DIAGNOSIS — M25512 Pain in left shoulder: Secondary | ICD-10-CM | POA: Insufficient documentation

## 2023-07-12 DIAGNOSIS — M5416 Radiculopathy, lumbar region: Secondary | ICD-10-CM | POA: Diagnosis not present

## 2023-07-12 MED ORDER — OXYCODONE HCL 10 MG PO TABS: 10.0000 mg | ORAL_TABLET | ORAL | 0 refills | Status: DC | PRN

## 2023-07-12 MED ORDER — GABAPENTIN 300 MG PO CAPS: 300.0000 mg | ORAL_CAPSULE | Freq: Two times a day (BID) | ORAL | 2 refills | Status: DC

## 2023-07-12 MED ORDER — MORPHINE SULFATE ER 15 MG PO TBCR: 15.0000 mg | EXTENDED_RELEASE_TABLET | Freq: Two times a day (BID) | ORAL | 0 refills | Status: DC

## 2023-07-12 NOTE — Progress Notes (Signed)
 Subjective:    Patient ID: Brandon [name], male    DOB: 10/19/1940, 83 y.o.   MRN: [id number]  HPI: Brandon [name] is a 83 y.o. male who returns for follow up appointment for chronic pain and medication refill. He states his pain is located in his left shoulder and lower back pain radiating into his bilateral lower extremities and left hip. She rates her pain 5. Her current exercise regime is walking and performing stretching exercises.  Brandon [name] Morphine  equivalent is 120.00 MME.   Last UDS was Performed on 07/12/2023, it was consistent.     Pain Inventory Average Pain 5 Pain Right Now 5 My pain is constant, dull, and aching  In the last 24 hours, has pain interfered with the following? General activity 7 Relation with others 7 Enjoyment of life 7 What TIME of day is your pain at its worst? morning , daytime, and varies Sleep (in general) Poor  Pain is worse with: walking, bending, and standing Pain improves with: rest, pacing activities, medication, and injections Relief from Meds: 4  Family History  Problem Relation Age of Onset   Heart disease Father 74       Living   Coronary artery disease Father        CABG   Alzheimer's disease Mother 20       Deceased   Arthritis Mother    Aneurysm Brother    Stomach cancer Maternal Uncle    Brain cancer Maternal Aunt        x2   Obesity Daughter        Had Bypass Sx   Social History   Socioeconomic History   Marital status: Married    Spouse name: etta   Number of children: 2   Years of education: Not on file   Highest education level: Not on file  Occupational History   Occupation: retired    Associate Professor: RETIRED    Comment: disabled due to back problems  Tobacco Use   Smoking status: Former    Current packs/day: 0.00    Average packs/day: 1.5 packs/day for 50.0 years (75.0 ttl pk-yrs)    Types: Cigarettes    Start date: 09/06/1967    Quit date: 09/05/2017    Years since quitting: 5.8    Passive exposure: Never    Smokeless tobacco: Never   Tobacco comments:    smoked less than 1 ppd for 40+ years;   Vaping Use   Vaping status: Never Used  Substance and Sexual Activity   Alcohol  use: Yes    Comment: rarely   Drug use: Yes    Types: Oxycodone , Morphine     Comment: has prescription   Sexual activity: Not Currently  Other Topics Concern   Not on file  Social History Narrative   ** Merged History Encounter **       Married (3rd), [place]. 2 children from 1st marriage, 4 step children.    Retired on disability due to back    Former Runner, broadcasting/film/video.   restores antique furniture for a hobby.       Cell # [phone]   Social Drivers of Health   Financial Resource Strain: Low Risk  (06/21/2023)   Overall Financial Resource Strain (CARDIA)    Difficulty of Paying Living Expenses: Not hard at all  Food Insecurity: No Food Insecurity (06/21/2023)   Hunger Vital Sign    Worried About Running Out of Food in the Last Year: Never true  Ran Out of Food in the Last Year: Never true  Transportation Needs: No Transportation Needs (06/21/2023)   PRAPARE - Administrator, Civil Service (Medical): No    Lack of Transportation (Non-Medical): No  Physical Activity: Inactive (06/21/2023)   Exercise Vital Sign    Days of Exercise per Week: 0 days    Minutes of Exercise per Session: 0 min  Stress: No Stress Concern Present (06/21/2023)   [employer] of Occupational Health - Occupational Stress Questionnaire    Feeling of Stress : Not at all  Social Connections: Moderately Integrated (06/21/2023)   Social Connection and Isolation Panel [NHANES]    Frequency of Communication with Friends and Family: More than three times a week    Frequency of Social Gatherings with Friends and Family: Three times a week    Attends Religious Services: More than 4 times per year    Active Member of Clubs or Organizations: No    Attends Banker Meetings: Never    Marital Status:  Married   Past Surgical History:  Procedure Laterality Date   ABDOMINAL AORTIC ENDOVASCULAR STENT GRAFT N/A 03/03/2021   Procedure: ABDOMINAL AORTIC ENDOVASCULAR STENT GRAFT;  Surgeon: Adine Hoof, MD;  Location: [facility] OR;  Service: Vascular;  Laterality: N/A;   ABDOMINAL AORTOGRAM W/LOWER EXTREMITY N/A 04/10/2021   Procedure: ABDOMINAL AORTOGRAM W/LOWER EXTREMITY;  Surgeon: Adine Hoof, MD;  Location: [facility] INVASIVE CV LAB;  Service: Cardiovascular;  Laterality: N/A;   BACK SURGERY     CARDIAC CATHETERIZATION     CATARACT EXTRACTION W/ INTRAOCULAR LENS  IMPLANT, BILATERAL Bilateral 01/2012 - 02/2012   CORONARY ATHERECTOMY N/A 04/27/2020   Procedure: CORONARY ATHERECTOMY;  Surgeon: Arnoldo Lapping, MD;  Location: [facility] INVASIVE CV LAB;  Service: Cardiovascular;  Laterality: N/A;   CORONARY IMAGING/OCT N/A 04/27/2020   Procedure: INTRAVASCULAR IMAGING/OCT;  Surgeon: Arnoldo Lapping, MD;  Location: [facility] INVASIVE CV LAB;  Service: Cardiovascular;  Laterality: N/A;   CORONARY PRESSURE/FFR STUDY N/A 04/27/2020   Procedure: INTRAVASCULAR PRESSURE WIRE/FFR STUDY;  Surgeon: Arnoldo Lapping, MD;  Location: [facility] INVASIVE CV LAB;  Service: Cardiovascular;  Laterality: N/A;   CORONARY STENT INTERVENTION N/A 04/27/2020   Procedure: CORONARY STENT INTERVENTION;  Surgeon: Arnoldo Lapping, MD;  Location: [facility] INVASIVE CV LAB;  Service: Cardiovascular;  Laterality: N/A;   DIRECT LARYNGOSCOPY Left 10/09/2017   Procedure: DIRECT LARYNGOSCOPY WITH BOPSY;  Surgeon: Lenton Rail, MD;  Location: [facility];  Service: ENT;  Laterality: Left;   ESOPHAGOGASTRODUODENOSCOPY (EGD) WITH ESOPHAGEAL DILATION     couple times (07/19/2016)   ESOPHAGOSCOPY Left 10/09/2017   Procedure: ESOPHAGOSCOPY;  Surgeon: Lenton Rail, MD;  Location: [facility];  Service: ENT;  Laterality: Left;   IR GASTROSTOMY TUBE MOD SED  01/08/2018   IR THORACENTESIS ASP PLEURAL SPACE W/IMG GUIDE   07/19/2016   LAPAROSCOPIC CHOLECYSTECTOMY  1984   LUMBAR DISC SURGERY  05/1996   L4-5; Dr. Antonina Kleine LAMINECTOMY/DECOMPRESSION MICRODISCECTOMY  10/2002   L3-4. Dr. Cipriano Creeks   MULTIPLE TOOTH EXTRACTIONS  1980s   PARTIAL GLOSSECTOMY  12/02/2017   Dr. Caldwell Castles- [facility]   pharyngoplasty for closure of tingue base defect  12/02/2017   Dr. Caldwell Castles- [name]   PICC LINE INSERTION  07/15/2016   POSTERIOR LUMBAR FUSION  09/1996   Ray cage, L4-5 Dr. Lawson Prey   PROSTATE BIOPSY  ~ 2017   radical tonsillectomy Left 12/02/2017   Dr. Caldwell Castles at [facility]   RIGHT HEART CATH AND CORONARY  ANGIOGRAPHY N/A 03/31/2020   Procedure: RIGHT HEART CATH AND CORONARY ANGIOGRAPHY;  Surgeon: Darlis Eisenmenger, MD;  Location: [facility] INVASIVE CV LAB;  Service: Cardiovascular;  Laterality: N/A;   RIGID BRONCHOSCOPY Left 10/09/2017   Procedure: RIGID BRONCHOSCOPY;  Surgeon: Lenton Rail, MD;  Location: [facility];  Service: ENT;  Laterality: Left;   TEE WITHOUT CARDIOVERSION N/A 06/07/2020   Procedure: TRANSESOPHAGEAL ECHOCARDIOGRAM (TEE);  Surgeon: Arnoldo Lapping, MD;  Location: [facility] OR;  Service: Open Heart Surgery;  Laterality: N/A;   TONSILLECTOMY     TOTAL HIP ARTHROPLASTY Left 12/04/2020   Procedure: TOTAL HIP ARTHROPLASTY ANTERIOR APPROACH;  Surgeon: Adonica Hoose, MD;  Location: [facility]S;  Service: Orthopedics;  Laterality: Left;   TOTAL HIP ARTHROPLASTY Left 11/2020   TRACHEOSTOMY  12/02/2017   Dr. Caldwell Castles- [facility]   ULTRASOUND GUIDANCE FOR VASCULAR ACCESS Right 06/07/2020   Procedure: ULTRASOUND GUIDANCE FOR VASCULAR ACCESS;  Surgeon: Arnoldo Lapping, MD;  Location: [facility] OR;  Service: Open Heart Surgery;  Laterality: Right;   ULTRASOUND GUIDANCE FOR VASCULAR ACCESS Bilateral 03/03/2021   Procedure: ULTRASOUND GUIDANCE FOR VASCULAR ACCESS, BILATERAL FEMORAL ARTERIES;  Surgeon: Adine Hoof, MD;  Location: [facility] OR;  Service: Vascular;  Laterality: Bilateral;   VASCULAR SURGERY     Past  Surgical History:  Procedure Laterality Date   ABDOMINAL AORTIC ENDOVASCULAR STENT GRAFT N/A 03/03/2021   Procedure: ABDOMINAL AORTIC ENDOVASCULAR STENT GRAFT;  Surgeon: Adine Hoof, MD;  Location: [facility] OR;  Service: Vascular;  Laterality: N/A;   ABDOMINAL AORTOGRAM W/LOWER EXTREMITY N/A 04/10/2021   Procedure: ABDOMINAL AORTOGRAM W/LOWER EXTREMITY;  Surgeon: Adine Hoof, MD;  Location: [facility] INVASIVE CV LAB;  Service: Cardiovascular;  Laterality: N/A;   BACK SURGERY     CARDIAC CATHETERIZATION     CATARACT EXTRACTION W/ INTRAOCULAR LENS  IMPLANT, BILATERAL Bilateral 01/2012 - 02/2012   CORONARY ATHERECTOMY N/A 04/27/2020   Procedure: CORONARY ATHERECTOMY;  Surgeon: Arnoldo Lapping, MD;  Location: [facility] INVASIVE CV LAB;  Service: Cardiovascular;  Laterality: N/A;   CORONARY IMAGING/OCT N/A 04/27/2020   Procedure: INTRAVASCULAR IMAGING/OCT;  Surgeon: Arnoldo Lapping, MD;  Location: [facility] INVASIVE CV LAB;  Service: Cardiovascular;  Laterality: N/A;   CORONARY PRESSURE/FFR STUDY N/A 04/27/2020   Procedure: INTRAVASCULAR PRESSURE WIRE/FFR STUDY;  Surgeon: Arnoldo Lapping, MD;  Location: [facility] INVASIVE CV LAB;  Service: Cardiovascular;  Laterality: N/A;   CORONARY STENT INTERVENTION N/A 04/27/2020   Procedure: CORONARY STENT INTERVENTION;  Surgeon: Arnoldo Lapping, MD;  Location: [facility] INVASIVE CV LAB;  Service: Cardiovascular;  Laterality: N/A;   DIRECT LARYNGOSCOPY Left 10/09/2017   Procedure: DIRECT LARYNGOSCOPY WITH BOPSY;  Surgeon: Lenton Rail, MD;  Location: [facility];  Service: ENT;  Laterality: Left;   ESOPHAGOGASTRODUODENOSCOPY (EGD) WITH ESOPHAGEAL DILATION     couple times (07/19/2016)   ESOPHAGOSCOPY Left 10/09/2017   Procedure: ESOPHAGOSCOPY;  Surgeon: Lenton Rail, MD;  Location: [facility];  Service: ENT;  Laterality: Left;   IR GASTROSTOMY TUBE MOD SED  01/08/2018   IR THORACENTESIS ASP PLEURAL SPACE W/IMG GUIDE  07/19/2016    LAPAROSCOPIC CHOLECYSTECTOMY  1984   LUMBAR DISC SURGERY  05/1996   L4-5; Dr. Antonina Kleine LAMINECTOMY/DECOMPRESSION MICRODISCECTOMY  10/2002   L3-4. Dr. Cipriano Creeks   MULTIPLE TOOTH EXTRACTIONS  1980s   PARTIAL GLOSSECTOMY  12/02/2017   Dr. Caldwell Castles- [facility]   pharyngoplasty for closure of tingue base defect  12/02/2017   Dr. Caldwell Castles- [facility]   PICC LINE INSERTION  07/15/2016  POSTERIOR LUMBAR FUSION  09/1996   Ray cage, L4-5 Dr. Lawson Prey   PROSTATE BIOPSY  ~ 2017   radical tonsillectomy Left 12/02/2017   Dr. Caldwell Castles at [facility]   RIGHT HEART CATH AND CORONARY ANGIOGRAPHY N/A 03/31/2020   Procedure: RIGHT HEART CATH AND CORONARY ANGIOGRAPHY;  Surgeon: Darlis Eisenmenger, MD;  Location: [facility] INVASIVE CV LAB;  Service: Cardiovascular;  Laterality: N/A;   RIGID BRONCHOSCOPY Left 10/09/2017   Procedure: RIGID BRONCHOSCOPY;  Surgeon: Lenton Rail, MD;  Location: [facility];  Service: ENT;  Laterality: Left;   TEE WITHOUT CARDIOVERSION N/A 06/07/2020   Procedure: TRANSESOPHAGEAL ECHOCARDIOGRAM (TEE);  Surgeon: Arnoldo Lapping, MD;  Location: [facility] OR;  Service: Open Heart Surgery;  Laterality: N/A;   TONSILLECTOMY     TOTAL HIP ARTHROPLASTY Left 12/04/2020   Procedure: TOTAL HIP ARTHROPLASTY ANTERIOR APPROACH;  Surgeon: Adonica Hoose, MD;  Location: [facility];  Service: Orthopedics;  Laterality: Left;   TOTAL HIP ARTHROPLASTY Left 11/2020   TRACHEOSTOMY  12/02/2017   Dr. Caldwell Castles- [facility]   ULTRASOUND GUIDANCE FOR VASCULAR ACCESS Right 06/07/2020   Procedure: ULTRASOUND GUIDANCE FOR VASCULAR ACCESS;  Surgeon: Arnoldo Lapping, MD;  Location: [facility] OR;  Service: Open Heart Surgery;  Laterality: Right;   ULTRASOUND GUIDANCE FOR VASCULAR ACCESS Bilateral 03/03/2021   Procedure: ULTRASOUND GUIDANCE FOR VASCULAR ACCESS, BILATERAL FEMORAL ARTERIES;  Surgeon: Adine Hoof, MD;  Location: [facility] OR;  Service: Vascular;  Laterality: Bilateral;   VASCULAR SURGERY     Past Medical History:   Diagnosis Date   Abdominal aneurysm (HCC)    Arthritis    all over (07/19/2016)   BPH (benign prostatic hypertrophy)    CAD (coronary artery disease)    Chicken pox    Chronic lower back pain    s/p surgical fusion   Depression    Diverticulosis    Esophageal stricture    GERD (gastroesophageal reflux disease)    Hepatitis B 1984   Hiatal hernia    History of radiation therapy 01/16/18- 03/05/18   Left Tonsil, 66 Gy in 33 fractions to high risk nodal echelons.    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 Arlester Ladd and Dr Sherene Dilling    Severe aortic stenosis    TIA (transient ischemic attack) 1990s   hx   tonsillar ca dx'd 11/2017   Tubular adenoma of colon 2009   BP (!) 156/70 (BP Location: Right Arm, Patient Position: Sitting)   Pulse 73   Ht 5' 8.5 (1.74 m)   Wt 144 lb (65.3 kg)   SpO2 94%   BMI 21.58 kg/m   Opioid Risk Score:   Fall Risk Score:  `1  Depression screen [facility] 2/9     07/12/2023   11:31 AM 06/21/2023    9:40 AM 05/15/2023   11:10 AM 01/08/2023    9:52 AM 09/24/2022   11:03 AM 07/16/2022   11:23 AM 06/26/2022    8:06 AM  Depression screen PHQ 2/9  Decreased Interest 0 0 0 0 0 2 1  Down, Depressed, Hopeless 0 0 0 0 0 1 1  PHQ - 2 Score 0 0 0 0 0 3 2  Altered sleeping 0 0     3  Tired, decreased energy 0 1     3  Change  in appetite 0 3     3  Feeling bad or failure about yourself  0 0     2  Trouble concentrating 0 0     0  Moving slowly or fidgety/restless 0 0     1  Suicidal thoughts 0 0     0  PHQ-9 Score 0 4     14  Difficult doing work/chores Not difficult at all Somewhat difficult     Somewhat difficult     Review of Systems  All other systems reviewed and are negative.      Objective:   Physical Exam Vitals and nursing note reviewed.  Constitutional:       Appearance: Normal appearance.   Cardiovascular:     Rate and Rhythm: Normal rate and regular rhythm.     Pulses: Normal pulses.     Heart sounds: Normal heart sounds.  Pulmonary:     Effort: Pulmonary effort is normal.     Breath sounds: Normal breath sounds.   Musculoskeletal:     Comments: Normal Muscle Bulk and Muscle Testing Reveals:  Upper Extremities: Right : Full ROM and Muscle Strength 5/5 Left Upper Extremity: decreased ROM 30 Degrees and Muscle Strength 5/5  Lumbar Paraspinal Tenderness: L-4-L-5 Lower Extremities: Full ROM and Muscle Strength 5/5 Arises from chair with ease Narrow Based  Gait      Skin:    General: Skin is warm and dry.   Neurological:     Mental Status: He is alert and oriented to person, place, and time.   Psychiatric:        Mood and Affect: Mood normal.        Behavior: Behavior normal.         Assessment & Plan:  Lumbar Post-Laminectomy Syndrome: Lumbar Spondylosis Continue HEP as Tolerated. Continue to Monitor. 07/12/2023 Lumbar Radiculitis: Continue HEP as Tolerated. Continue to Monitor. Brandon [name] has stopped taking his gabapentin , he reports it was ineffective.  Continue to Monitor. 07/12/2023 Chronic Pain Syndrome: Continue MS Contin  15 mg every 12 hours #60,  Refilled: Oxycodone  10 mg one tablet every 4 hours #150. Second script sent to accommodate scheduled appointment. We will continue the opioid monitoring program, this consists of regular clinic visits, examinations, urine drug screen, pill counts as well as use of [place]  Controlled Substance Reporting system. A 12 month History has been reviewed on the [place]  Controlled Substance Reporting System 07/12/2023   4. Restless Leg Syndrome: Continue Gabapentin .  Continue current medication regimen. Continue to Monitor. 07/12/2023   F/U in 2 months

## 2023-07-17 LAB — DRUG TOX MONITOR 1 W/CONF, ORAL FLD
Amphetamines: NEGATIVE ng/mL (ref <10)
Barbiturates: NEGATIVE ng/mL (ref <10)
Benzodiazepines: NEGATIVE ng/mL (ref <0.50)
Buprenorphine: NEGATIVE ng/mL (ref <0.10)
Cocaine: NEGATIVE ng/mL (ref <5.0)
Codeine: NEGATIVE ng/mL (ref <2.5)
Cotinine: >250 ng/mL — ABNORMAL HIGH (ref <5.0)
Dihydrocodeine: NEGATIVE ng/mL (ref <2.5)
Fentanyl: NEGATIVE ng/mL (ref <0.10)
Heroin Metabolite: NEGATIVE ng/mL (ref <1.0)
Hydrocodone: NEGATIVE ng/mL (ref <2.5)
Hydromorphone: NEGATIVE ng/mL (ref <2.5)
MARIJUANA: NEGATIVE ng/mL (ref <2.5)
MDMA: NEGATIVE ng/mL (ref <10)
Meprobamate: NEGATIVE ng/mL (ref <2.5)
Methadone: NEGATIVE ng/mL (ref <5.0)
Morphine: 20.9 ng/mL — ABNORMAL HIGH (ref <2.5)
Nicotine Metabolite: POSITIVE ng/mL — AB (ref <5.0)
Norhydrocodone: NEGATIVE ng/mL (ref <2.5)
Noroxycodone: 81.6 ng/mL — ABNORMAL HIGH (ref <2.5)
Opiates: POSITIVE ng/mL — AB (ref <2.5)
Oxycodone: >250 ng/mL — ABNORMAL HIGH (ref <2.5)
Oxymorphone: 3.1 ng/mL — ABNORMAL HIGH (ref <2.5)
Phencyclidine: NEGATIVE ng/mL (ref <10)
Tapentadol: NEGATIVE ng/mL (ref <5.0)
Tramadol: NEGATIVE ng/mL (ref <5.0)
Zolpidem: NEGATIVE ng/mL (ref <5.0)

## 2023-07-17 LAB — DRUG TOX ALC METAB W/CON, ORAL FLD: Alcohol Metabolite: NEGATIVE ng/mL (ref <25)

## 2023-07-18 NOTE — Telephone Encounter (Signed)
 I verified that he is taking the medication. He said the restless legs came back so he went back on the gabapentin .

## 2023-07-26 DIAGNOSIS — M25512 Pain in left shoulder: Secondary | ICD-10-CM | POA: Diagnosis not present

## 2023-07-30 DIAGNOSIS — C108 Malignant neoplasm of overlapping sites of oropharynx: Secondary | ICD-10-CM | POA: Diagnosis not present

## 2023-07-31 ENCOUNTER — Ambulatory Visit (HOSPITAL_COMMUNITY)
Admission: RE | Admit: 2023-07-31 | Discharge: 2023-07-31 | Disposition: A | Discharge status: Home / Self Care | Source: Ambulatory Visit | Attending: Internal Medicine | Admitting: Internal Medicine

## 2023-07-31 ENCOUNTER — Ambulatory Visit (HOSPITAL_COMMUNITY): Payer: Self-pay | Admitting: Family Medicine

## 2023-07-31 DIAGNOSIS — I5032 Chronic diastolic (congestive) heart failure: Secondary | ICD-10-CM | POA: Insufficient documentation

## 2023-07-31 LAB — LIPID PANEL
Cholesterol: 131 mg/dL (ref 0–200)
HDL: 48 mg/dL (ref >40)
LDL Cholesterol: 68 mg/dL (ref 0–99)
Total CHOL/HDL Ratio: 2.7 ratio
Triglycerides: 73 mg/dL (ref <150)
VLDL: 15 mg/dL (ref 0–40)

## 2023-08-12 ENCOUNTER — Telehealth: Payer: Self-pay

## 2023-08-12 NOTE — Telephone Encounter (Signed)
 Patient called requesting a refill on Oxycodone . Dr. Murray sent a prescription to the pharmacy that says DNF until 08/11/23. Called and informed patient.

## 2023-08-13 ENCOUNTER — Other Ambulatory Visit: Payer: Self-pay | Admitting: Rheumatology

## 2023-08-13 MED ORDER — PREDNISONE 5 MG PO TABS: ORAL_TABLET | ORAL | 0 refills | Status: DC

## 2023-08-13 MED ORDER — PREDNISONE 1 MG PO TABS: 3.0000 mg | ORAL_TABLET | Freq: Every day | ORAL | 0 refills | Status: DC

## 2023-08-13 NOTE — Telephone Encounter (Signed)
 Last Fill: 06/24/2023 (Prednisone  1 mg), 05/17/2023(Prednisone  5 mg)  Next Visit: 09/25/2023  Last Visit: 06/24/2023  Dx: Polymyalgia rheumatica   Current Dose per office note on 06/24/2023: Prednisone  10 mg daily x1 week, 9 mg daily x1 week, then reduce to 8 mg daily.   Patient confirmed he is taking Prednisone  8 mg po daily.  Okay to refill Prednisone ?

## 2023-08-13 NOTE — Telephone Encounter (Signed)
 Please check to see when patients last hgb A1c was ordered. Thank you!

## 2023-08-13 NOTE — Telephone Encounter (Signed)
 Per patient's chart last checked 02/11/2023.

## 2023-08-13 NOTE — Telephone Encounter (Signed)
 Patient contacted the office to request a medication refill.   1. Name of Medication: Prednisone   2. How are you currently taking this medication (dosage and times per day)? 8mg  a day   3. What pharmacy would you like for that to be sent to? [facility]- [address]

## 2023-08-15 DIAGNOSIS — M25512 Pain in left shoulder: Secondary | ICD-10-CM | POA: Diagnosis not present

## 2023-08-21 ENCOUNTER — Telehealth: Payer: Self-pay | Admitting: Pharmacist

## 2023-08-21 DIAGNOSIS — C108 Malignant neoplasm of overlapping sites of oropharynx: Secondary | ICD-10-CM | POA: Diagnosis not present

## 2023-08-21 NOTE — Telephone Encounter (Signed)
 Patient due for second Reclast  infusion on 09/11/2023. Has f/u with our office on 09/25/2023. Will draw labs at that visit and renew Reclast  infusion thereafter  Alfonza Toft, PharmD, MPH, BCPS, CPP Clinical Pharmacist (Rheumatology and Pulmonology)

## 2023-09-11 ENCOUNTER — Encounter: Attending: Physical Medicine and Rehabilitation | Admitting: Physical Medicine and Rehabilitation

## 2023-09-11 ENCOUNTER — Encounter: Payer: Self-pay | Admitting: Physical Medicine and Rehabilitation

## 2023-09-11 VITALS — BP 147/68 | HR 72 | Ht 68.5 in | Wt 139.0 lb

## 2023-09-11 DIAGNOSIS — M353 Polymyalgia rheumatica: Secondary | ICD-10-CM | POA: Insufficient documentation

## 2023-09-11 DIAGNOSIS — M5416 Radiculopathy, lumbar region: Secondary | ICD-10-CM | POA: Insufficient documentation

## 2023-09-11 DIAGNOSIS — Z79891 Long term (current) use of opiate analgesic: Secondary | ICD-10-CM | POA: Insufficient documentation

## 2023-09-11 DIAGNOSIS — M19041 Primary osteoarthritis, right hand: Secondary | ICD-10-CM | POA: Insufficient documentation

## 2023-09-11 DIAGNOSIS — G894 Chronic pain syndrome: Secondary | ICD-10-CM | POA: Insufficient documentation

## 2023-09-11 DIAGNOSIS — M19042 Primary osteoarthritis, left hand: Secondary | ICD-10-CM | POA: Insufficient documentation

## 2023-09-11 MED ORDER — OXYCODONE HCL 10 MG PO TABS: 10.0000 mg | ORAL_TABLET | ORAL | 0 refills | Status: DC | PRN

## 2023-09-11 MED ORDER — MORPHINE SULFATE ER 15 MG PO TBCR: 15.0000 mg | EXTENDED_RELEASE_TABLET | Freq: Two times a day (BID) | ORAL | 0 refills | Status: DC

## 2023-09-11 NOTE — Progress Notes (Signed)
 Subjective:    Patient ID: Brandon [name], male    DOB: Jul 04, 1940, 83 y.o.   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 S/p L THR- 12/02/20  Getting AAA repair as of 1/23. Pt also had  5th R finger fx- out of brace now.    Hasn't picked up MS contin  since 6/23-  out of MS Contin -  Not using  MS Contin - as much because the weather was so hot, staying in. If weather changes, will do more again.   Lost use of L arm- has to use R arm to lift LUE- due to L RTC surgery needed. Torn due to arthritis.   Wife also needs surgery for knee prior to Shoulder surgery he needs to have.   Wife is 26.  Doesn't want to be burden to wife . Trouble putting socks on-  doing button down shirts so can get shirt on.   Doing slide on shoes as well, on purpose.   Dad made it to 5 years old.  Said he's worked much harder in his life than his dad did.   Dr Thedora gave him something to help him stop smoking- is able to quit smoking again!   Stays stuffy since sinus surgery and radiation /for throat/tongue cancer- also flonase  helps him breathe.   L shoulder actually hurting less- likely due to complete tear of L RTC_ educated pt.   Last Tox screen 07/12/23- was appropriate.   Brother is 9 years old!   Wife is finally realizing that she's having OA issues- in knee- and so starting to understand pt's pain   Hasn't had shot in back lately- by Dr Bonner    Pain Inventory Average Pain 5 Pain Right Now 5 My pain is intermittent, constant, dull, stabbing, and aching  In the last 24 hours, has pain interfered with the  following? General activity 7 Relation with others 7 Enjoyment of life 3 What TIME of day is your pain at its worst? morning  and daytime Sleep (in general) Fair  Pain is worse with: walking, bending, and standing Pain improves with: rest, heat/ice, and medication Relief from Meds: good  Family History  Problem Relation Age of Onset   Heart disease Father 48       Living   Coronary artery disease Father        CABG   Alzheimer's disease Mother 68       Deceased   Arthritis Mother    Aneurysm Brother    Stomach cancer Maternal Uncle    Brain cancer Maternal Aunt        x2   Obesity Daughter        Had Bypass Sx   Social History   Socioeconomic History   Marital status: Married    Spouse name: etta   Number of children: 2   Years of education: Not on file   Highest education level: Not on file  Occupational History   Occupation: retired    Associate Professor: RETIRED  Comment: disabled due to back problems  Tobacco Use   Smoking status: Former    Current packs/day: 0.00    Average packs/day: 1.5 packs/day for 50.0 years (75.0 ttl pk-yrs)    Types: Cigarettes    Start date: 09/06/1967    Quit date: 09/05/2017    Years since quitting: 6.0    Passive exposure: Never   Smokeless tobacco: Never   Tobacco comments:    smoked less than 1 ppd for 40+ years;   Vaping Use   Vaping status: Never Used  Substance and Sexual Activity   Alcohol  use: Yes    Comment: rarely   Drug use: Yes    Types: Oxycodone , Morphine     Comment: has prescription   Sexual activity: Not Currently  Other Topics Concern   Not on file  Social History Narrative   ** Merged History Encounter **       Married (3rd), [place]. 2 children from 1st marriage, 4 step children.    Retired on disability due to back    Former Runner, broadcasting/film/video.   restores antique furniture for a hobby.       Cell # [phone]   Social Drivers of Health   Financial Resource Strain: Low Risk  (06/21/2023)    Overall Financial Resource Strain (CARDIA)    Difficulty of Paying Living Expenses: Not hard at all  Food Insecurity: No Food Insecurity (06/21/2023)   Hunger Vital Sign    Worried About Running Out of Food in the Last Year: Never true    Ran Out of Food in the Last Year: Never true  Transportation Needs: No Transportation Needs (06/21/2023)   PRAPARE - Administrator, Civil Service (Medical): No    Lack of Transportation (Non-Medical): No  Physical Activity: Inactive (06/21/2023)   Exercise Vital Sign    Days of Exercise per Week: 0 days    Minutes of Exercise per Session: 0 min  Stress: No Stress Concern Present (06/21/2023)   [employer] of Occupational Health - Occupational Stress Questionnaire    Feeling of Stress : Not at all  Social Connections: Moderately Integrated (06/21/2023)   Social Connection and Isolation Panel    Frequency of Communication with Friends and Family: More than three times a week    Frequency of Social Gatherings with Friends and Family: Three times a week    Attends Religious Services: More than 4 times per year    Active Member of Clubs or Organizations: No    Attends Banker Meetings: Never    Marital Status: Married   Past Surgical History:  Procedure Laterality Date   ABDOMINAL AORTIC ENDOVASCULAR STENT GRAFT N/A 03/03/2021   Procedure: ABDOMINAL AORTIC ENDOVASCULAR STENT GRAFT;  Surgeon: Sheree Penne Bruckner, MD;  Location: [facility] OR;  Service: Vascular;  Laterality: N/A;   ABDOMINAL AORTOGRAM W/LOWER EXTREMITY N/A 04/10/2021   Procedure: ABDOMINAL AORTOGRAM W/LOWER EXTREMITY;  Surgeon: Sheree Penne Bruckner, MD;  Location: [facility] INVASIVE CV LAB;  Service: Cardiovascular;  Laterality: N/A;   BACK SURGERY     CARDIAC CATHETERIZATION     CATARACT EXTRACTION W/ INTRAOCULAR LENS  IMPLANT, BILATERAL Bilateral 01/2012 - 02/2012   CORONARY ATHERECTOMY N/A 04/27/2020   Procedure: CORONARY ATHERECTOMY;  Surgeon: Wonda Sharper, MD;   Location: [facility] INVASIVE CV LAB;  Service: Cardiovascular;  Laterality: N/A;   CORONARY IMAGING/OCT N/A 04/27/2020   Procedure: INTRAVASCULAR IMAGING/OCT;  Surgeon: Wonda Sharper, MD;  Location: [facility] INVASIVE CV LAB;  Service: Cardiovascular;  Laterality: N/A;   CORONARY PRESSURE/FFR STUDY N/A 04/27/2020   Procedure: INTRAVASCULAR PRESSURE WIRE/FFR STUDY;  Surgeon: Wonda Sharper, MD;  Location: [facility] INVASIVE CV LAB;  Service: Cardiovascular;  Laterality: N/A;   CORONARY STENT INTERVENTION N/A 04/27/2020   Procedure: CORONARY STENT INTERVENTION;  Surgeon: Wonda Sharper, MD;  Location: [facility] INVASIVE CV LAB;  Service: Cardiovascular;  Laterality: N/A;   DIRECT LARYNGOSCOPY Left 10/09/2017   Procedure: DIRECT LARYNGOSCOPY WITH BOPSY;  Surgeon: Arlana Arnt, MD;  Location: [facility];  Service: ENT;  Laterality: Left;   ESOPHAGOGASTRODUODENOSCOPY (EGD) WITH ESOPHAGEAL DILATION     couple times (07/19/2016)   ESOPHAGOSCOPY Left 10/09/2017   Procedure: ESOPHAGOSCOPY;  Surgeon: Arlana Arnt, MD;  Location: [facility];  Service: ENT;  Laterality: Left;   IR GASTROSTOMY TUBE MOD SED  01/08/2018   IR THORACENTESIS ASP PLEURAL SPACE W/IMG GUIDE  07/19/2016   LAPAROSCOPIC CHOLECYSTECTOMY  1984   LUMBAR DISC SURGERY  05/1996   L4-5; Dr. Alix ILES LAMINECTOMY/DECOMPRESSION MICRODISCECTOMY  10/2002   L3-4. Dr. Alix   MULTIPLE TOOTH EXTRACTIONS  1980s   PARTIAL GLOSSECTOMY  12/02/2017   Dr. Lauralee- [facility]   pharyngoplasty for closure of tingue base defect  12/02/2017   Dr. Lauralee- [facility]   PICC LINE INSERTION  07/15/2016   POSTERIOR LUMBAR FUSION  09/1996   Ray cage, L4-5 Dr. Mora   PROSTATE BIOPSY  ~ 2017   radical tonsillectomy Left 12/02/2017   Dr. [facility]alee at [facility]   RIGHT HEART CATH AND CORONARY ANGIOGRAPHY N/A 03/31/2020   Procedure: RIGHT HEART CATH AND CORONARY ANGIOGRAPHY;  Surgeon: Rolan Ezra RAMAN, MD;  Location: [facility] INVASIVE CV LAB;  Service:  Cardiovascular;  Laterality: N/A;   RIGID BRONCHOSCOPY Left 10/09/2017   Procedure: RIGID BRONCHOSCOPY;  Surgeon: Arlana Arnt, MD;  Location: [facility];  Service: ENT;  Laterality: Left;   TEE WITHOUT CARDIOVERSION N/A 06/07/2020   Procedure: TRANSESOPHAGEAL ECHOCARDIOGRAM (TEE);  Surgeon: Wonda Sharper, MD;  Location: [facility] OR;  Service: Open Heart Surgery;  Laterality: N/A;   TONSILLECTOMY     TOTAL HIP ARTHROPLASTY Left 12/04/2020   Procedure: TOTAL HIP ARTHROPLASTY ANTERIOR APPROACH;  Surgeon: Fidel Rogue, MD;  Location: [facility] ORS;  Service: Orthopedics;  Laterality: Left;   TOTAL HIP ARTHROPLASTY Left 11/2020   TRACHEOSTOMY  12/02/2017   Dr. Lauralee- [facility]   ULTRASOUND GUIDANCE FOR VASCULAR ACCESS Right 06/07/2020   Procedure: ULTRASOUND GUIDANCE FOR VASCULAR ACCESS;  Surgeon: Wonda Sharper, MD;  Location: [facility] OR;  Service: Open Heart Surgery;  Laterality: Right;   ULTRASOUND GUIDANCE FOR VASCULAR ACCESS Bilateral 03/03/2021   Procedure: ULTRASOUND GUIDANCE FOR VASCULAR ACCESS, BILATERAL FEMORAL ARTERIES;  Surgeon: Sheree Penne Bruckner, MD;  Location: [facility] OR;  Service: Vascular;  Laterality: Bilateral;   VASCULAR SURGERY     Past Surgical History:  Procedure Laterality Date   ABDOMINAL AORTIC ENDOVASCULAR STENT GRAFT N/A 03/03/2021   Procedure: ABDOMINAL AORTIC ENDOVASCULAR STENT GRAFT;  Surgeon: Sheree Penne Bruckner, MD;  Location: [facility] OR;  Service: Vascular;  Laterality: N/A;   ABDOMINAL AORTOGRAM W/LOWER EXTREMITY N/A 04/10/2021   Procedure: ABDOMINAL AORTOGRAM W/LOWER EXTREMITY;  Surgeon: Sheree Penne Bruckner, MD;  Location: [facility] INVASIVE CV LAB;  Service: Cardiovascular;  Laterality: N/A;   BACK SURGERY     CARDIAC CATHETERIZATION     CATARACT EXTRACTION W/ INTRAOCULAR LENS  IMPLANT, BILATERAL Bilateral 01/2012 - 02/2012   CORONARY ATHERECTOMY N/A 04/27/2020   Procedure: CORONARY ATHERECTOMY;  Surgeon: Wonda Sharper, MD;  Location: [facility] INVASIVE CV LAB;   Service: Cardiovascular;  Laterality: N/A;   CORONARY IMAGING/OCT N/A 04/27/2020   Procedure: INTRAVASCULAR IMAGING/OCT;  Surgeon: Wonda Sharper, MD;  Location: [facility] INVASIVE CV LAB;  Service: Cardiovascular;  Laterality: N/A;   CORONARY PRESSURE/FFR STUDY N/A 04/27/2020   Procedure: INTRAVASCULAR PRESSURE WIRE/FFR STUDY;  Surgeon: Wonda Sharper, MD;  Location: [facility] INVASIVE CV LAB;  Service: Cardiovascular;  Laterality: N/A;   CORONARY STENT INTERVENTION N/A 04/27/2020   Procedure: CORONARY STENT INTERVENTION;  Surgeon: Wonda Sharper, MD;  Location: [facility] INVASIVE CV LAB;  Service: Cardiovascular;  Laterality: N/A;   DIRECT LARYNGOSCOPY Left 10/09/2017   Procedure: DIRECT LARYNGOSCOPY WITH BOPSY;  Surgeon: Arlana Arnt, MD;  Location: [facility];  Service: ENT;  Laterality: Left;   ESOPHAGOGASTRODUODENOSCOPY (EGD) WITH ESOPHAGEAL DILATION     couple times (07/19/2016)   ESOPHAGOSCOPY Left 10/09/2017   Procedure: ESOPHAGOSCOPY;  Surgeon: Arlana Arnt, MD;  Location: [facility];  Service: ENT;  Laterality: Left;   IR GASTROSTOMY TUBE MOD SED  01/08/2018   IR THORACENTESIS ASP PLEURAL SPACE W/IMG GUIDE  07/19/2016   LAPAROSCOPIC CHOLECYSTECTOMY  1984   LUMBAR DISC SURGERY  05/1996   L4-5; Dr. Alix ILES LAMINECTOMY/DECOMPRESSION MICRODISCECTOMY  10/2002   L3-4. Dr. Alix   MULTIPLE TOOTH EXTRACTIONS  1980s   PARTIAL GLOSSECTOMY  12/02/2017   Dr. Lauralee- [facility]   pharyngoplasty for closure of tingue base defect  12/02/2017   Dr. Lauralee- [facility]   PICC LINE INSERTION  07/15/2016   POSTERIOR LUMBAR FUSION  09/1996   Ray cage, L4-5 Dr. Mora   PROSTATE BIOPSY  ~ 2017   radical tonsillectomy Left 12/02/2017   Dr. [facility]alee at [facility]   RIGHT HEART CATH AND CORONARY ANGIOGRAPHY N/A 03/31/2020   Procedure: RIGHT HEART CATH AND CORONARY ANGIOGRAPHY;  Surgeon: Rolan Ezra RAMAN, MD;  Location: [facility] INVASIVE CV LAB;  Service: Cardiovascular;  Laterality:  N/A;   RIGID BRONCHOSCOPY Left 10/09/2017   Procedure: RIGID BRONCHOSCOPY;  Surgeon: Arlana Arnt, MD;  Location: [facility];  Service: ENT;  Laterality: Left;   TEE WITHOUT CARDIOVERSION N/A 06/07/2020   Procedure: TRANSESOPHAGEAL ECHOCARDIOGRAM (TEE);  Surgeon: Wonda Sharper, MD;  Location: [facility] OR;  Service: Open Heart Surgery;  Laterality: N/A;   TONSILLECTOMY     TOTAL HIP ARTHROPLASTY Left 12/04/2020   Procedure: TOTAL HIP ARTHROPLASTY ANTERIOR APPROACH;  Surgeon: Fidel Rogue, MD;  Location: [facility];  Service: Orthopedics;  Laterality: Left;   TOTAL HIP ARTHROPLASTY Left 11/2020   TRACHEOSTOMY  12/02/2017   Dr. [facility]alee- [facility]   ULTRASOUND GUIDANCE FOR VASCULAR ACCESS Right 06/07/2020   Procedure: ULTRASOUND GUIDANCE FOR VASCULAR ACCESS;  Surgeon: Wonda Sharper, MD;  Location: [facility] OR;  Service: Open Heart Surgery;  Laterality: Right;   ULTRASOUND GUIDANCE FOR VASCULAR ACCESS Bilateral 03/03/2021   Procedure: ULTRASOUND GUIDANCE FOR VASCULAR ACCESS, BILATERAL FEMORAL ARTERIES;  Surgeon: Sheree Penne Bruckner, MD;  Location: [facility] OR;  Service: Vascular;  Laterality: Bilateral;   VASCULAR SURGERY     Past Medical History:  Diagnosis Date   Abdominal aneurysm (HCC)    Arthritis    all over (07/19/2016)   BPH (benign prostatic hypertrophy)    CAD (coronary artery disease)    Chicken pox    Chronic lower back pain    s/p surgical fusion   Depression    Diverticulosis    Esophageal stricture    GERD (gastroesophageal reflux disease)    Hepatitis B 1984  Hiatal hernia    History of radiation therapy 01/16/18- 03/05/18   Left Tonsil, 66 Gy in 33 fractions to high risk nodal echelons.    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 Wonda and Dr Lucas    Severe aortic stenosis     TIA (transient ischemic attack) 1990s   hx   tonsillar ca dx'd 11/2017   Tubular adenoma of colon 2009   There were no vitals taken for this visit.  Opioid Risk Score:   Fall Risk Score:  `1  Depression screen [facility] 2/9     07/12/2023   11:31 AM 06/21/2023    9:40 AM 05/15/2023   11:10 AM 01/08/2023    9:52 AM 09/24/2022   11:03 AM 07/16/2022   11:23 AM 06/26/2022    8:06 AM  Depression screen PHQ 2/9  Decreased Interest 0 0 0 0 0 2 1  Down, Depressed, Hopeless 0 0 0 0 0 1 1  PHQ - 2 Score 0 0 0 0 0 3 2  Altered sleeping 0 0     3  Tired, decreased energy 0 1     3  Change in appetite 0 3     3  Feeling bad or failure about yourself  0 0     2  Trouble concentrating 0 0     0  Moving slowly or fidgety/restless 0 0     1  Suicidal thoughts 0 0     0  PHQ-9 Score 0 4     14  Difficult doing work/chores Not difficult at all Somewhat difficult     Somewhat difficult    Review of Systems  Musculoskeletal:  Positive for back pain.       Left shoulder pain, pain in both upper legs  All other systems reviewed and are negative.      Objective:   Physical Exam  Awake, alert, appropriate, no assistive device, NAD Sometimes hard to understand Hands are obviously arthritic Cannot lift L shoulder above 20 degrees- needs to use R arm to lift /flex/abduct L shoulder much at all-  No TTP on L shoulder.        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 S/p L THR- 12/02/20  Getting AAA repair as of 1/23. Pt also had  5th R finger fx- out of brace now.    L Rotator cuff tear- needs RTC  surgery/replacement. - will do after wife's knee surgery.  Pain is actually better with complete tear. [facility]- will try to do in next 1 month.    2. Staying busy- with restoring- furniture.   3. Don't stop moving!!!!  4. Con't MS Contin  15 mg 2x/day-  for chronic pain G89.3 and G89.21  5. Con't Oxycodone  10 mg - 5 pills/day- same ICD 10 codes.  6. Last urine drug screen 6/6 /25-   7. F/U in 2 months with Fidela and 4months with me.    8. Call Dr Bonner to get shot ESI.    I spent a total of 31   minutes on total care today- >50% coordination of care- due to d/w pt at length  about what's going on with pt overall with wife and relationship concerns- and pain meds- and L RTC surgery he needs.

## 2023-09-11 NOTE — Progress Notes (Signed)
 Office Visit Note  Patient: Brandon [name]             Date of Birth: 12/30/1940           MRN: [id number]             PCP: Thedora Garnette HERO, MD Referring: Thedora Garnette HERO, MD Visit Date: 09/25/2023 Occupation: @GUAROCC @  Subjective:  Pain in both hands   History of Present Illness: Brandon [name] is a 83 y.o. male with history of PMR, gout, and osteoporosis.  Patient reports that he has reduced the dose of prednisone  down to 5 mg daily.  He has been taking 5 mg of prednisone  for the past 1-1/2 weeks.  Patient states he has been having increased pain in both hands with stiffness lasting 4 to 6 hours daily.  He has been having to take more Tylenol  for symptomatic relief and remains on oxycodone  for chronic pain relief.  Patient states that he will be proceeding with a left reverse total shoulder replacement by Dr. Kay once he receives clearance from his PCP and cardiology.  He continues to have severe pain in severely limited range of motion of the left shoulder.  He denies any signs or symptoms of a polymyalgia rheumatica flare.  He denies any signs or symptoms of a gout flare.   Activities of Daily Living:  Patient reports morning stiffness for 4-6 hours.   Patient Reports nocturnal pain.  Difficulty dressing/grooming: Denies Difficulty climbing stairs: Reports Difficulty getting out of chair: Denies Difficulty using hands for taps, buttons, cutlery, and/or writing: Reports  Review of Systems  Constitutional:  Negative for fatigue.  HENT:  Negative for mouth sores and mouth dryness.   Eyes:  Positive for dryness.  Respiratory:  Negative for shortness of breath.   Cardiovascular:  Negative for chest pain and palpitations.  Gastrointestinal:  Negative for blood in stool, constipation and diarrhea.  Endocrine: Negative for increased urination.  Genitourinary:  Negative for involuntary urination.  Musculoskeletal:  Positive for joint pain, gait problem, joint pain, joint swelling,  myalgias, muscle weakness, morning stiffness, muscle tenderness and myalgias.  Skin:  Positive for sensitivity to sunlight. Negative for color change, rash and hair loss.  Allergic/Immunologic: Negative for susceptible to infections.  Neurological:  Negative for dizziness and headaches.  Hematological:  Negative for swollen glands.  Psychiatric/Behavioral:  Positive for depressed mood. Negative for sleep disturbance. The patient is not nervous/anxious.     PMFS History:  Patient Active Problem List   Diagnosis Date Noted   Primary osteoarthritis, left shoulder 02/27/2023   Anemia of chronic disease 02/11/2023   Hyperglycemia 02/11/2023   Vitamin B12 deficiency 02/11/2023   Age related osteoporosis 08/31/2022   Malnutrition of mild degree (HCC) 08/01/2022   Insomnia 06/26/2022   Arthritis of left hand 06/26/2022   Weight loss 06/26/2022   Chronic gout of multiple sites 04/30/2022   Chronically on opiate therapy 02/12/2022   Sacroiliac joint pain 02/12/2022   At risk for polypharmacy 01/19/2022   Trochanteric bursitis of left hip 01/02/2022   Dysfunction of left eustachian tube 12/05/2021   Hypotension 08/18/2021   Hyponatremia 06/26/2021   Hyperkalemia 06/26/2021   Acute metabolic encephalopathy 06/26/2021   Myoclonic jerking 06/26/2021   Lumbar radiculopathy 06/08/2021   Lumbar postlaminectomy syndrome 05/03/2021   Degeneration of lumbar intervertebral disc 05/03/2021   Lumbar spondylosis 05/03/2021   History of hip fracture 04/24/2021   S/P abdominal aortic aneurysm repair 04/24/2021   Chronic allergic rhinitis  01/24/2021   Demand ischemia (HCC)    Elevated troponin    Chronic diastolic heart failure (HCC) 12/03/2020   Steatosis of liver 11/01/2020   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   Hepatitis B core antibody positive 11/01/2020   Benign prostatic hyperplasia 11/01/2020   Chronic obstructive pulmonary disease (HCC) 07/20/2020   History of placement of stent in LAD  coronary artery 07/20/2020   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   TIA (transient ischemic attack)    Esophageal stricture    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   Primary osteoarthritis of both hands 01/11/2020   Piriformis syndrome of both sides 09/23/2019   Sciatica associated with disorder of lumbar spine 04/24/2019   Primary osteoarthritis involving multiple joints 04/24/2019   Chronic pain syndrome 04/24/2019   Mixed anxiety and depressive disorder 04/15/2018   Oropharyngeal dysphagia 03/11/2018   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   Tobacco abuse disorder 01/10/2014   Essential hypertension 05/19/2013   Psoriasis 01/13/2009   Coronary artery disease 09/15/2008   Diverticulosis of colon 08/27/2007   History of benign prostatic hyperplasia 08/27/2007   Hyperlipidemia 03/19/2007   Gastroesophageal reflux disease 03/19/2007    Past Medical History:  Diagnosis Date   Abdominal aneurysm (HCC)    Arthritis    all over (07/19/2016)   BPH (benign prostatic hypertrophy)    CAD (coronary artery disease)    Chicken pox    Chronic lower back pain    s/p surgical fusion   Depression    Diverticulosis    Esophageal stricture    GERD (gastroesophageal reflux disease)    Hepatitis B 1984   Hiatal hernia    History of radiation therapy 01/16/18- 03/05/18   Left Tonsil, 66 Gy in 33 fractions to high risk nodal echelons.    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 Wonda and Dr Lucas    Severe aortic stenosis    TIA (transient ischemic attack) 1990s   hx   tonsillar ca dx'd 11/2017   Tubular adenoma of colon 2009    Family History  Problem Relation Age of Onset   Heart disease Father 81       Living   Coronary artery disease Father         CABG   Alzheimer's disease Mother 47       Deceased   Arthritis Mother    Aneurysm Brother    Stomach cancer Maternal Uncle    Brain cancer Maternal Aunt        x2   Obesity Daughter        Had Bypass Sx   Past Surgical History:  Procedure Laterality Date   ABDOMINAL AORTIC ENDOVASCULAR STENT GRAFT N/A 03/03/2021   Procedure: ABDOMINAL AORTIC ENDOVASCULAR STENT GRAFT;  Surgeon: Sheree Penne Bruckner, MD;  Location: [facility] OR;  Service: Vascular;  Laterality: N/A;   ABDOMINAL AORTOGRAM W/LOWER EXTREMITY N/A 04/10/2021   Procedure: ABDOMINAL AORTOGRAM W/LOWER EXTREMITY;  Surgeon: Sheree Penne Bruckner, MD;  Location: [facility] INVASIVE CV LAB;  Service: Cardiovascular;  Laterality: N/A;   BACK SURGERY     CARDIAC CATHETERIZATION     CATARACT EXTRACTION W/ INTRAOCULAR LENS  IMPLANT, BILATERAL Bilateral 01/2012 - 02/2012   CORONARY ATHERECTOMY N/A 04/27/2020   Procedure: CORONARY ATHERECTOMY;  Surgeon:  Wonda Sharper, MD;  Location: [facility] INVASIVE CV LAB;  Service: Cardiovascular;  Laterality: N/A;   CORONARY IMAGING/OCT N/A 04/27/2020   Procedure: INTRAVASCULAR IMAGING/OCT;  Surgeon: Wonda Sharper, MD;  Location: [name] INVASIVE CV LAB;  Service: Cardiovascular;  Laterality: N/A;   CORONARY PRESSURE/FFR STUDY N/A 04/27/2020   Procedure: INTRAVASCULAR PRESSURE WIRE/FFR STUDY;  Surgeon: Wonda Sharper, MD;  Location: [facility] INVASIVE CV LAB;  Service: Cardiovascular;  Laterality: N/A;   CORONARY STENT INTERVENTION N/A 04/27/2020   Procedure: CORONARY STENT INTERVENTION;  Surgeon: Wonda Sharper, MD;  Location: [facility] INVASIVE CV LAB;  Service: Cardiovascular;  Laterality: N/A;   DIRECT LARYNGOSCOPY Left 10/09/2017   Procedure: DIRECT LARYNGOSCOPY WITH BOPSY;  Surgeon: Arlana Arnt, MD;  Location: [facility];  Service: ENT;  Laterality: Left;   ESOPHAGOGASTRODUODENOSCOPY (EGD) WITH ESOPHAGEAL DILATION     couple times (07/19/2016)   ESOPHAGOSCOPY Left 10/09/2017   Procedure: ESOPHAGOSCOPY;   Surgeon: Arlana Arnt, MD;  Location: [facility];  Service: ENT;  Laterality: Left;   IR GASTROSTOMY TUBE MOD SED  01/08/2018   IR THORACENTESIS ASP PLEURAL SPACE W/IMG GUIDE  07/19/2016   LAPAROSCOPIC CHOLECYSTECTOMY  1984   LUMBAR DISC SURGERY  05/1996   L4-5; Dr. Alix ILES LAMINECTOMY/DECOMPRESSION MICRODISCECTOMY  10/2002   L3-4. Dr. Alix   MULTIPLE TOOTH EXTRACTIONS  1980s   PARTIAL GLOSSECTOMY  12/02/2017   Dr. Lauralee- [facility]   pharyngoplasty for closure of tingue base defect  12/02/2017   Dr. Lauralee- [facility]   PICC LINE INSERTION  07/15/2016   POSTERIOR LUMBAR FUSION  09/1996   Ray cage, L4-5 Dr. Mora   PROSTATE BIOPSY  ~ 2017   radical tonsillectomy Left 12/02/2017   Dr. Lauralee at [facility]   RIGHT HEART CATH AND CORONARY ANGIOGRAPHY N/A 03/31/2020   Procedure: RIGHT HEART CATH AND CORONARY ANGIOGRAPHY;  Surgeon: Rolan Ezra RAMAN, MD;  Location: [facility] INVASIVE CV LAB;  Service: Cardiovascular;  Laterality: N/A;   RIGID BRONCHOSCOPY Left 10/09/2017   Procedure: RIGID BRONCHOSCOPY;  Surgeon: Arlana Arnt, MD;  Location: [facility];  Service: ENT;  Laterality: Left;   TEE WITHOUT CARDIOVERSION N/A 06/07/2020   Procedure: TRANSESOPHAGEAL ECHOCARDIOGRAM (TEE);  Surgeon: Wonda Sharper, MD;  Location: [facility] OR;  Service: Open Heart Surgery;  Laterality: N/A;   TONSILLECTOMY     TOTAL HIP ARTHROPLASTY Left 12/04/2020   Procedure: TOTAL HIP ARTHROPLASTY ANTERIOR APPROACH;  Surgeon: Fidel Rogue, MD;  Location: [facility] ORS;  Service: Orthopedics;  Laterality: Left;   TOTAL HIP ARTHROPLASTY Left 11/2020   TRACHEOSTOMY  12/02/2017   Dr. [facility]alee- [facility]   ULTRASOUND GUIDANCE FOR VASCULAR ACCESS Right 06/07/2020   Procedure: ULTRASOUND GUIDANCE FOR VASCULAR ACCESS;  Surgeon: Wonda Sharper, MD;  Location: [facility] OR;  Service: Open Heart Surgery;  Laterality: Right;   ULTRASOUND GUIDANCE FOR VASCULAR ACCESS Bilateral 03/03/2021   Procedure: ULTRASOUND  GUIDANCE FOR VASCULAR ACCESS, BILATERAL FEMORAL ARTERIES;  Surgeon: Sheree Penne Bruckner, MD;  Location: [facility] OR;  Service: Vascular;  Laterality: Bilateral;   VASCULAR SURGERY     Social History   Social History Narrative   ** Merged History Encounter **       Married (3rd), [place]. 2 children from 1st marriage, 4 step children.    Retired on disability due to back    Former Runner, broadcasting/film/video.   restores antique furniture for a hobby.       Cell # [id number]   Immunization History  Administered Date(s) Administered  Fluad Quad(high Dose 65+) 10/15/2018, 11/06/2019, 10/20/2020   Influenza Split 11/26/2010, 10/11/2011, 11/03/2012   Influenza Whole 11/14/2006, 12/04/2007, 10/06/2008, 10/25/2009, 11/26/2021   Influenza, High Dose Seasonal PF 11/01/2016, 10/30/2017   Influenza, Seasonal, Injecte, Preservative Fre 12/07/2014   Influenza,inj,Quad PF,6+ Mos 11/04/2015   Influenza-Unspecified 11/19/2013, 11/06/2022   PFIZER(Purple Top)SARS-COV-2 Vaccination 02/25/2019, 03/18/2019, 11/16/2019   Pneumococcal Conjugate-13 12/07/2014   Pneumococcal Polysaccharide-23 05/30/2017   Tdap 01/28/2020     Objective: Vital Signs: BP (!) 113/51 (BP Location: Right Arm, Patient Position: Sitting, Cuff Size: Normal)   Pulse 60   Resp 16   Ht 5' 8.5 (1.74 m)   Wt 139 lb (63 kg)   BMI 20.83 kg/m    Physical Exam Vitals and nursing note reviewed.  Constitutional:      Appearance: He is well-developed.  HENT:     Head: Normocephalic and atraumatic.  Eyes:     Conjunctiva/sclera: Conjunctivae normal.     Pupils: Pupils are equal, round, and reactive to light.  Cardiovascular:     Rate and Rhythm: Normal rate and regular rhythm.     Heart sounds: Normal heart sounds.  Pulmonary:     Effort: Pulmonary effort is normal.     Breath sounds: Normal breath sounds.  Abdominal:     General: Bowel sounds are normal.     Palpations: Abdomen is soft.  Musculoskeletal:      Cervical back: Normal range of motion and neck supple.  Skin:    General: Skin is warm and dry.     Capillary Refill: Capillary refill takes less than 2 seconds.  Neurological:     Mental Status: He is alert and oriented to person, place, and time.  Psychiatric:        Behavior: Behavior normal.      Musculoskeletal Exam: C-spine has limited range of motion.  Thoracic kyphosis noted.  Limited and painful mobility of the lumbar spine.  Right shoulder has good range of motion.  Left shoulder has severely limited range of motion with discomfort and tenderness.  No tenderness along the elbow joint line currently.  PIP and DIP thickening consistent with osteoarthritis of both hands.  CMC joint prominence bilaterally.  Knee joints have good range of motion with no warmth or effusion.  Ankle joints have good range of motion with no tenderness or joint swelling.  CDAI Exam: CDAI Score: -- Patient Global: --; Provider Global: -- Swollen: --; Tender: -- Joint Exam 09/25/2023   No joint exam has been documented for this visit   There is currently no information documented on the homunculus. Go to the Rheumatology activity and complete the homunculus joint exam.  Investigation: No additional findings.  Imaging: No results found.  Recent Labs: Lab Results  Component Value Date   WBC 7.0 01/24/2023   HGB 11.6 (L) 01/24/2023   PLT 112 (L) 01/24/2023   NA 136 06/12/2023   K 4.4 06/12/2023   CL 99 06/12/2023   CO2 26 06/12/2023   GLUCOSE 119 (H) 06/12/2023   BUN 19 06/12/2023   CREATININE 0.75 06/12/2023   BILITOT 0.7 06/12/2023   ALKPHOS 91 06/12/2023   AST 12 (L) 06/12/2023   ALT 17 06/12/2023   PROT 6.3 (L) 06/12/2023   ALBUMIN  3.2 (L) 06/12/2023   CALCIUM  8.8 (L) 06/12/2023   GFRAA 107 08/11/2020   QFTBGOLDPLUS NEGATIVE 01/11/2020    Speciality Comments: Fosamax  started on January 26, 2021.  If he has dysphagia with Fosamax  or any GI intolerance we  will have to switch him to  Reclast .  Procedures:  No procedures performed Allergies: Tape and Celebrex [celecoxib]   Assessment / Plan:     Visit Diagnoses: Polymyalgia rheumatica (HCC): No signs or symptoms of a polymyalgia rheumatica flare.  He recently tried reducing prednisone  from 8 mg to 5 mg and has had an exacerbation of pain involving multiple joints.  His symptoms have been most severe involving both hands.  He has been having to take Tylenol  more consistently as well as taking oxycodone  for pain relief.  He plans on increasing prednisone  back to 8 mg daily  Long term (current) use of systemic steroids - Prednisone  8 mg daily--unable to taper which an exacerbation of symptoms.  He is aware of the risks of long term prednisone  use. Hgb A1c updated today.  Initiated IV reclast  on 09/11/22. Plan to proceed with the second IV reclast  infusion pending lab results.   - Plan: HgB A1c  Medication monitoring encounter -Plan to check CBC, CMP, vitamin D , and Hgb A1c today.  plan: CBC with Differential/Platelet, Comprehensive metabolic panel with GFR, HgB A1c  Chronic left shoulder pain -Severe pain and limited range of motion.  X-rays obtained by Dr. Addie on 02/27/2023.  He had a left glenohumeral joint cortisone injection on 05/28/2023.  He has been under the care of Dr. Kay and is planning to proceed with a left reverse total shoulder replacement pending clearance by cardiology and his PCP.  Primary osteoarthritis of both hands: Patient presents today with increased pain and stiffness involving both hands.  Since reducing the dose of prednisone  to 5 mg daily his morning stiffness has been lasting 4 to 6 hours.  He has been having to take Tylenol  more consistently for symptomatic relief.  He plans on increasing prednisone  back to 8 mg daily as prescribed.  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  Doing well.  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 uric acid: 4.4 on 01/24/2023. No signs or symptoms of a gout flare.  Degeneration of intervertebral disc of lumbar region without discogenic back pain or lower extremity pain: Chronic pain.  Taking oxycodone  for pain relief.  Age-related osteoporosis without current pathological fracture: DEXA updated on 07/27/2022: Right femoral neck BMD 0.558 with T-score -2.7.  -20% change in BMD for right total femur.  Previous DEXA scan 07/21/20: showed T score -2.2 in the right femoral neck, BMD 0.638.  He was started on Fosamax  70 mg December 2022-only took initial prescription prescribed.  History of GERD.   Long term prednisonse use/history of PMR. Hx of hip fracture after a fall.  Plan to avoid anabolic agents given history of radiation therapy. He had his first IV Reclast  infusion on 09/11/2022.  Plan to proceed with second IV Reclast  infusion pending lab results.  Orders released today.  Vitamin D  deficiency -Plan to check vitamin D  today.  Plan: VITAMIN D  25 Hydroxy (Vit-D Deficiency, Fractures)  Chronic pain syndrome: Under care of pain management.  Psoriasis: No active psoriasis at this time.   Other medical conditions are listed as follows:  Aneurysm of infrarenal abdominal aorta, unspecified whether ruptured (HCC)  History of aortic valve replacement  Oropharyngeal dysphagia  History of gastroesophageal reflux (GERD)  History of hyperlipidemia  Anxiety and depression  Essential hypertension: Blood pressure is 113/51 today in the office.  Patient was advised to monitor blood pressure closely to reach  out to cardiology if his diastolic remains low.  Cancer of tonsillar fossa (HCC)  Former smoker  History of iron  deficiency anemia  Liver abscess  History of diverticulitis  History of atherectomy    Orders: Orders Placed This Encounter  Procedures   CBC with Differential/Platelet    Comprehensive metabolic panel with GFR   VITAMIN D  25 Hydroxy (Vit-D Deficiency, Fractures)   HgB A1c   Meds ordered this encounter  Medications   predniSONE  (DELTASONE ) 5 MG tablet    Sig: TAKE 1 TABLET(5 MG) BY MOUTH DAILY WITH BREAKFAST    Dispense:  90 tablet    Refill:  0    Follow-Up Instructions: Return in about 3 months (around 12/26/2023) for Polymyalgia Rheumatica, Osteoporosis.   Waddell CHRISTELLA Craze, PA-C  Note - This record has been created using Dragon software.  Chart creation errors have been sought, but may not always  have been located. Such creation errors do not reflect on  the standard of medical care.

## 2023-09-11 NOTE — Patient Instructions (Addendum)
 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 S/p L THR- 12/02/20  Getting AAA repair as of 1/23. Pt also had  5th R finger fx- out of brace now.    L Rotator cuff tear- needs RTC surgery/replacement. - will do after wife's knee surgery.  Pain is actually better with complete tear.   2. Staying busy- with restoring- furniture.   3. Don't stop moving!!!!  4. Con't MS Contin  15 mg 2x/day-  for chronic pain G89.3 and G89.21  5. Con't Oxycodone  10 mg - 5 pills/day- same ICD 10 codes.    6. Last urine drug screen 6/6 /25-   7. F/U in 2 months with Fidela and 4months with me.   8. Call Dr Bonner to get shot ESI.  Epidural steroid injection.

## 2023-09-24 ENCOUNTER — Telehealth: Payer: Self-pay

## 2023-09-24 NOTE — Telephone Encounter (Signed)
   Pre-operative Risk Assessment    Patient Name: Brandon [name]  DOB: 06/13/1940 MRN: [id number]   Date of last office visit: 07/13/21 KATHRYN THOMPSON, PA-C Date of next office visit: NONE   Request for Surgical Clearance    Procedure:  LEFT REVERSE TOTAL SHOULDER ARTHROPLASY  Date of Surgery:  Clearance TBD                                Surgeon:  DR ELSPETH HER Surgeon's Group or Practice Name:  [facility] Phone number:  [phone] Fax number:  [phone]  ATTN: MEGAN DAVIS   Type of Clearance Requested:   - Medical  - Pharmacy:  Hold Aspirin      Type of Anesthesia:  CHOICE   Additional requests/questions:    SignedLucie DELENA Ku   09/24/2023, 3:57 PM

## 2023-09-25 ENCOUNTER — Telehealth: Payer: Self-pay

## 2023-09-25 ENCOUNTER — Ambulatory Visit: Attending: Physician Assistant | Admitting: Physician Assistant

## 2023-09-25 ENCOUNTER — Encounter: Payer: Self-pay | Admitting: Physician Assistant

## 2023-09-25 VITALS — BP 113/51 | HR 60 | Resp 16 | Ht 68.5 in | Wt 139.0 lb

## 2023-09-25 DIAGNOSIS — Z7952 Long term (current) use of systemic steroids: Secondary | ICD-10-CM

## 2023-09-25 DIAGNOSIS — Z5181 Encounter for therapeutic drug level monitoring: Secondary | ICD-10-CM

## 2023-09-25 DIAGNOSIS — G8929 Other chronic pain: Secondary | ICD-10-CM

## 2023-09-25 DIAGNOSIS — M19041 Primary osteoarthritis, right hand: Secondary | ICD-10-CM | POA: Diagnosis not present

## 2023-09-25 DIAGNOSIS — F32A Depression, unspecified: Secondary | ICD-10-CM

## 2023-09-25 DIAGNOSIS — Z8719 Personal history of other diseases of the digestive system: Secondary | ICD-10-CM

## 2023-09-25 DIAGNOSIS — K750 Abscess of liver: Secondary | ICD-10-CM

## 2023-09-25 DIAGNOSIS — M51369 Other intervertebral disc degeneration, lumbar region without mention of lumbar back pain or lower extremity pain: Secondary | ICD-10-CM

## 2023-09-25 DIAGNOSIS — Z952 Presence of prosthetic heart valve: Secondary | ICD-10-CM

## 2023-09-25 DIAGNOSIS — M25512 Pain in left shoulder: Secondary | ICD-10-CM

## 2023-09-25 DIAGNOSIS — Z87891 Personal history of nicotine dependence: Secondary | ICD-10-CM

## 2023-09-25 DIAGNOSIS — M810 Age-related osteoporosis without current pathological fracture: Secondary | ICD-10-CM

## 2023-09-25 DIAGNOSIS — Z8639 Personal history of other endocrine, nutritional and metabolic disease: Secondary | ICD-10-CM

## 2023-09-25 DIAGNOSIS — M1A0210 Idiopathic chronic gout, right elbow, without tophus (tophi): Secondary | ICD-10-CM

## 2023-09-25 DIAGNOSIS — E559 Vitamin D deficiency, unspecified: Secondary | ICD-10-CM

## 2023-09-25 DIAGNOSIS — G894 Chronic pain syndrome: Secondary | ICD-10-CM

## 2023-09-25 DIAGNOSIS — L409 Psoriasis, unspecified: Secondary | ICD-10-CM

## 2023-09-25 DIAGNOSIS — I10 Essential (primary) hypertension: Secondary | ICD-10-CM

## 2023-09-25 DIAGNOSIS — F419 Anxiety disorder, unspecified: Secondary | ICD-10-CM

## 2023-09-25 DIAGNOSIS — Z96642 Presence of left artificial hip joint: Secondary | ICD-10-CM

## 2023-09-25 DIAGNOSIS — Z9889 Other specified postprocedural states: Secondary | ICD-10-CM

## 2023-09-25 DIAGNOSIS — Z131 Encounter for screening for diabetes mellitus: Secondary | ICD-10-CM | POA: Diagnosis not present

## 2023-09-25 DIAGNOSIS — R1312 Dysphagia, oropharyngeal phase: Secondary | ICD-10-CM

## 2023-09-25 DIAGNOSIS — Z862 Personal history of diseases of the blood and blood-forming organs and certain disorders involving the immune mechanism: Secondary | ICD-10-CM

## 2023-09-25 DIAGNOSIS — M353 Polymyalgia rheumatica: Secondary | ICD-10-CM | POA: Diagnosis not present

## 2023-09-25 DIAGNOSIS — C090 Malignant neoplasm of tonsillar fossa: Secondary | ICD-10-CM

## 2023-09-25 DIAGNOSIS — M19042 Primary osteoarthritis, left hand: Secondary | ICD-10-CM

## 2023-09-25 DIAGNOSIS — I7143 Infrarenal abdominal aortic aneurysm, without rupture: Secondary | ICD-10-CM

## 2023-09-25 MED ORDER — PREDNISONE 5 MG PO TABS: ORAL_TABLET | ORAL | 0 refills | Status: DC

## 2023-09-25 NOTE — Telephone Encounter (Signed)
   Name: Brandon [name]  DOB: Sep 18, 1940  MRN: [id number]  Primary Cardiologist: Ezra Shuck, MD   Preoperative team, please contact this patient and set up a phone call appointment for further preoperative risk assessment. Please obtain consent and complete medication review. Thank you for your help.  I confirm that guidance regarding antiplatelet and oral anticoagulation therapy has been completed and, if necessary, noted below.  Per office protocol, if patient is without any new symptoms or concerns at the time of their virtual visit, he may hold ASA for 7 days prior to procedure. Please resume ASA as soon as possible postprocedure, at the discretion of the surgeon.    I also confirmed the patient resides in the state of [place] . As per [facility] Medical Board telemedicine laws, the patient must reside in the state in which the provider is licensed.   [name], NP 09/25/2023, 8:42 AM [facility]

## 2023-09-25 NOTE — Telephone Encounter (Signed)
 Received fax from [facility] requesting surgical clearance.   Dr. Dolphus signed clearance form stating patient should continue allopurinol  and prednisone . Please get surgical clearance from cardiology  Clearance has been faxed to [facility]. I advised patient the clearance has been faxed.

## 2023-09-26 ENCOUNTER — Ambulatory Visit: Payer: Self-pay | Admitting: Physician Assistant

## 2023-09-26 LAB — CBC WITH DIFFERENTIAL/PLATELET
Absolute Lymphocytes: 1086 {cells}/uL (ref 850–3900)
Absolute Monocytes: 547 {cells}/uL (ref 200–950)
Basophils Absolute: 8 {cells}/uL (ref 0–200)
Basophils Relative: 0.1 %
Eosinophils Absolute: 231 {cells}/uL (ref 15–500)
Eosinophils Relative: 3 %
HCT: 34.7 % — ABNORMAL LOW (ref 38.5–50.0)
Hemoglobin: 10.7 g/dL — ABNORMAL LOW (ref 13.2–17.1)
MCH: 27.2 pg (ref 27.0–33.0)
MCHC: 30.8 g/dL — ABNORMAL LOW (ref 32.0–36.0)
MCV: 88.1 fL (ref 80.0–100.0)
MPV: 10 fL (ref 7.5–12.5)
Monocytes Relative: 7.1 %
Neutro Abs: 5829 {cells}/uL (ref 1500–7800)
Neutrophils Relative %: 75.7 %
Platelets: 138 Thousand/uL — ABNORMAL LOW (ref 140–400)
RBC: 3.94 Million/uL — ABNORMAL LOW (ref 4.20–5.80)
RDW: 14.3 % (ref 11.0–15.0)
Total Lymphocyte: 14.1 %
WBC: 7.7 Thousand/uL (ref 3.8–10.8)

## 2023-09-26 LAB — COMPREHENSIVE METABOLIC PANEL WITH GFR
AG Ratio: 1.6 (calc) (ref 1.0–2.5)
ALT: 9 U/L (ref 9–46)
AST: 10 U/L (ref 10–35)
Albumin: 3.5 g/dL — ABNORMAL LOW (ref 3.6–5.1)
Alkaline phosphatase (APISO): 110 U/L (ref 35–144)
BUN/Creatinine Ratio: 28 (calc) — ABNORMAL HIGH (ref 6–22)
BUN: 17 mg/dL (ref 7–25)
CO2: 28 mmol/L (ref 20–32)
Calcium: 8.5 mg/dL — ABNORMAL LOW (ref 8.6–10.3)
Chloride: 103 mmol/L (ref 98–110)
Creat: 0.6 mg/dL — ABNORMAL LOW (ref 0.70–1.22)
Globulin: 2.2 g/dL (ref 1.9–3.7)
Glucose, Bld: 133 mg/dL — ABNORMAL HIGH (ref 65–99)
Potassium: 4.3 mmol/L (ref 3.5–5.3)
Sodium: 138 mmol/L (ref 135–146)
Total Bilirubin: 0.4 mg/dL (ref 0.2–1.2)
Total Protein: 5.7 g/dL — ABNORMAL LOW (ref 6.1–8.1)
eGFR: 98 mL/min/1.73m2 (ref >=60)

## 2023-09-26 LAB — HEMOGLOBIN A1C
Hgb A1c MFr Bld: 6.1 % — ABNORMAL HIGH (ref <5.7)
Mean Plasma Glucose: 128 mg/dL
eAG (mmol/L): 7.1 mmol/L

## 2023-09-26 LAB — VITAMIN D 25 HYDROXY (VIT D DEFICIENCY, FRACTURES): Vit D, 25-Hydroxy: 43 ng/mL (ref 30–100)

## 2023-09-26 NOTE — Progress Notes (Signed)
 Recommend starting a calcium  supplement as long has his PCP does not see any reason why he cannot take one.

## 2023-09-26 NOTE — Progress Notes (Signed)
 Vitamin D  WNL  Hgb A1c is 6.1%--improved.   Calcium  remains low---8.5-please clarify if he is taking calcium ?  RBC count, hgb, and hct are low--patient should discuss anemia with PCP.   Platelet count low but has improved.

## 2023-09-26 NOTE — Progress Notes (Signed)
 Thank you for the quick response. I usually calculate the corrected calcium  when albumin  is low, but I was unsure if he still may benefit from calcium  supplementation due to diagnosis of osteoporosis.  He is about to proceed with the second reclast  infusion under our care.   We can notify him to hold off on taking a supplement at this time though-thank you

## 2023-09-27 ENCOUNTER — Telehealth: Payer: Self-pay | Admitting: Pharmacy Technician

## 2023-09-27 ENCOUNTER — Other Ambulatory Visit: Payer: Self-pay | Admitting: Pharmacist

## 2023-09-27 NOTE — Telephone Encounter (Signed)
 Devki, Patient will be scheduled as soon as possible.  Auth Submission: NO AUTH NEEDED Site of care: Site of care: [facility] Payer: BC[employer] MEDICARE Medication & CPT/J Code(s) submitted: Reclast  (Zolendronic acid) J3489 Diagnosis Code: M81.0 Route of submission (phone, fax, portal):  Phone # Fax # Auth type: Buy/Bill PB Units/visits requested: X1 DOSE Reference number:  Approval from: 09/27/23 to 02/05/24

## 2023-09-27 NOTE — Progress Notes (Signed)
 Next infusion for Reclast  IV (G6510) due. Therapy plan placed for [facility] [phone]) [place]ita  Diagnosis: age-related osteoporosis  Dose: 5mg  IV every 12 months  Last Clinic Visit: 09/25/2023 Next Clinic Visit: not scheduled  Last infusion: 09/11/2022  Labs: CBC, CMP, Vitamin D  on 09/25/2023  Orders placed for Reclast  IV (J3489) x 1 dose along with premedication of acetaminophen  650mg  p.o. and diphenhydramine  25mg  p.o. to be administered 30 minutes before medication infusion.  Sherry Pennant, PharmD, MPH, BCPS, CPP Clinical Pharmacist (Rheumatology and Pulmonology)

## 2023-10-01 ENCOUNTER — Ambulatory Visit (INDEPENDENT_AMBULATORY_CARE_PROVIDER_SITE_OTHER): Admitting: Family Medicine

## 2023-10-01 ENCOUNTER — Encounter: Payer: Self-pay | Admitting: Family Medicine

## 2023-10-01 VITALS — BP 122/66 | HR 81 | Temp 97.4°F | Ht 68.5 in | Wt 137.4 lb

## 2023-10-01 DIAGNOSIS — I5032 Chronic diastolic (congestive) heart failure: Secondary | ICD-10-CM | POA: Diagnosis not present

## 2023-10-01 DIAGNOSIS — I10 Essential (primary) hypertension: Secondary | ICD-10-CM

## 2023-10-01 DIAGNOSIS — E538 Deficiency of other specified B group vitamins: Secondary | ICD-10-CM

## 2023-10-01 DIAGNOSIS — J439 Emphysema, unspecified: Secondary | ICD-10-CM

## 2023-10-01 DIAGNOSIS — I2510 Atherosclerotic heart disease of native coronary artery without angina pectoris: Secondary | ICD-10-CM | POA: Diagnosis not present

## 2023-10-01 DIAGNOSIS — M19012 Primary osteoarthritis, left shoulder: Secondary | ICD-10-CM

## 2023-10-01 DIAGNOSIS — S46212S Strain of muscle, fascia and tendon of other parts of biceps, left arm, sequela: Secondary | ICD-10-CM

## 2023-10-01 DIAGNOSIS — Z720 Tobacco use: Secondary | ICD-10-CM

## 2023-10-01 DIAGNOSIS — N400 Enlarged prostate without lower urinary tract symptoms: Secondary | ICD-10-CM

## 2023-10-01 DIAGNOSIS — Z01818 Encounter for other preprocedural examination: Secondary | ICD-10-CM | POA: Diagnosis not present

## 2023-10-01 DIAGNOSIS — S46212A Strain of muscle, fascia and tendon of other parts of biceps, left arm, initial encounter: Secondary | ICD-10-CM | POA: Insufficient documentation

## 2023-10-01 DIAGNOSIS — D638 Anemia in other chronic diseases classified elsewhere: Secondary | ICD-10-CM

## 2023-10-01 MED ORDER — TAMSULOSIN HCL 0.4 MG PO CAPS: 0.4000 mg | ORAL_CAPSULE | Freq: Every day | ORAL | 3 refills | Status: AC

## 2023-10-01 NOTE — Assessment & Plan Note (Signed)
 I will defer cardiac clearance for surgery to Mr. [facility] cardiologist.

## 2023-10-01 NOTE — Progress Notes (Signed)
 [facility] PRIMARY CARE [facility] [address] [place] [address] Dept: [phone] Dept Fax: [phone]  Office Visit  Subjective:    Patient ID: Brandon [name], male    DOB: December 05, 1940, 83 y.o..   MRN: [id number]  Chief Complaint  Patient presents with   Pre-op Exam    Pre-op clearance for Left shoulder    History of Present Illness:  Patient is in today for preoperative clearance for a proposed left reverse total shoulder arthroplasty by Dr. Kay. He indicates the anesthesia will be Choice.  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 He has not had any sign of local recurrence.   Brandon [name] experienced 49 lbs of weight loss from 11/2020 to present. He is including protein supplements daily.   Brandon [name] has a history of COPD. He is managed on albuterol  andtiotropium. He continues to have relapses to smoking and notes that he is trying once again to taper and quit.   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 He is managed on prednisone  currently at 3 mg daily.   Brandon [name] has a history of chronic diastolic heart failure, coronary artery disease, hypertension, and a prior TIA.He is managed on aspirin  81 mg daily, amlodipine  10 mg daily carvedilol  12.5 mg bid, and furosemide  20 mg daily.   Past Medical History: Patient Active Problem List   Diagnosis Date Noted   Rupture of left biceps tendon 10/01/2023   Primary osteoarthritis, left shoulder 02/27/2023   Anemia of chronic disease 02/11/2023   Hyperglycemia 02/11/2023   Vitamin B12 deficiency 02/11/2023   Age related osteoporosis 08/31/2022   Malnutrition of mild degree (HCC) 08/01/2022   Insomnia 06/26/2022   Arthritis of left hand 06/26/2022   Weight loss 06/26/2022   Chronic gout of multiple sites 04/30/2022   Chronically on opiate therapy 02/12/2022   Sacroiliac joint pain 02/12/2022   At risk for polypharmacy 01/19/2022   Trochanteric bursitis of left hip  01/02/2022   Dysfunction of left eustachian tube 12/05/2021   Hypotension 08/18/2021   Hyponatremia 06/26/2021   Hyperkalemia 06/26/2021   Acute metabolic encephalopathy 06/26/2021   Myoclonic jerking 06/26/2021   Lumbar radiculopathy 06/08/2021   Lumbar postlaminectomy syndrome 05/03/2021   Degeneration of lumbar intervertebral disc 05/03/2021   Lumbar spondylosis 05/03/2021   History of hip fracture 04/24/2021   S/P abdominal aortic aneurysm repair 04/24/2021   Chronic allergic rhinitis 01/24/2021   Demand ischemia (HCC)    Elevated troponin    Chronic diastolic heart failure (HCC) 12/03/2020   Steatosis of liver 11/01/2020   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   Hepatitis B core antibody positive 11/01/2020   Benign prostatic hyperplasia 11/01/2020   Chronic obstructive pulmonary disease (HCC) 07/20/2020   History of placement of stent in LAD coronary artery 07/20/2020   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   TIA (transient ischemic attack)    Esophageal stricture    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   Primary osteoarthritis of both hands 01/11/2020   Piriformis syndrome of both sides 09/23/2019   Sciatica associated with disorder of lumbar spine 04/24/2019   Primary osteoarthritis involving multiple joints 04/24/2019   Chronic pain syndrome 04/24/2019   Mixed anxiety and depressive disorder 04/15/2018   Oropharyngeal dysphagia 03/11/2018   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   Tobacco abuse disorder 01/10/2014   Essential hypertension 05/19/2013   Psoriasis 01/13/2009   Coronary artery disease 09/15/2008   Diverticulosis  of colon 08/27/2007   History of benign prostatic hyperplasia 08/27/2007   Hyperlipidemia 03/19/2007   Gastroesophageal reflux disease 03/19/2007   Past Surgical History:  Procedure Laterality Date   ABDOMINAL AORTIC ENDOVASCULAR STENT GRAFT N/A 03/03/2021   Procedure: ABDOMINAL AORTIC ENDOVASCULAR  STENT GRAFT;  Surgeon: Sheree Penne Bruckner, MD;  Location: [facility] OR;  Service: Vascular;  Laterality: N/A;   ABDOMINAL AORTOGRAM W/LOWER EXTREMITY N/A 04/10/2021   Procedure: ABDOMINAL AORTOGRAM W/LOWER EXTREMITY;  Surgeon: Sheree Penne Bruckner, MD;  Location: [facility] INVASIVE CV LAB;  Service: Cardiovascular;  Laterality: N/A;   BACK SURGERY     CARDIAC CATHETERIZATION     CATARACT EXTRACTION W/ INTRAOCULAR LENS  IMPLANT, BILATERAL Bilateral 01/2012 - 02/2012   CORONARY ATHERECTOMY N/A 04/27/2020   Procedure: CORONARY ATHERECTOMY;  Surgeon: Wonda Sharper, MD;  Location: [facility] INVASIVE CV LAB;  Service: Cardiovascular;  Laterality: N/A;   CORONARY IMAGING/OCT N/A 04/27/2020   Procedure: INTRAVASCULAR IMAGING/OCT;  Surgeon: Wonda Sharper, MD;  Location: [facility] INVASIVE CV LAB;  Service: Cardiovascular;  Laterality: N/A;   CORONARY PRESSURE/FFR STUDY N/A 04/27/2020   Procedure: INTRAVASCULAR PRESSURE WIRE/FFR STUDY;  Surgeon: Wonda Sharper, MD;  Location: [facility] INVASIVE CV LAB;  Service: Cardiovascular;  Laterality: N/A;   CORONARY STENT INTERVENTION N/A 04/27/2020   Procedure: CORONARY STENT INTERVENTION;  Surgeon: Wonda Sharper, MD;  Location: [facility] INVASIVE CV LAB;  Service: Cardiovascular;  Laterality: N/A;   DIRECT LARYNGOSCOPY Left 10/09/2017   Procedure: DIRECT LARYNGOSCOPY WITH BOPSY;  Surgeon: Arlana Arnt, MD;  Location: [facility];  Service: ENT;  Laterality: Left;   ESOPHAGOGASTRODUODENOSCOPY (EGD) WITH ESOPHAGEAL DILATION     couple times (07/19/2016)   ESOPHAGOSCOPY Left 10/09/2017   Procedure: ESOPHAGOSCOPY;  Surgeon: Arlana Arnt, MD;  Location: [facility];  Service: ENT;  Laterality: Left;   IR GASTROSTOMY TUBE MOD SED  01/08/2018   IR THORACENTESIS ASP PLEURAL SPACE W/IMG GUIDE  07/19/2016   LAPAROSCOPIC CHOLECYSTECTOMY  1984   LUMBAR DISC SURGERY  05/1996   L4-5; Dr. Alix ILES LAMINECTOMY/DECOMPRESSION MICRODISCECTOMY  10/2002   L3-4.  Dr. Alix   MULTIPLE TOOTH EXTRACTIONS  1980s   PARTIAL GLOSSECTOMY  12/02/2017   Dr. Lauralee- [facility]   pharyngoplasty for closure of tingue base defect  12/02/2017   Dr. Lauralee- [facility]   PICC LINE INSERTION  07/15/2016   POSTERIOR LUMBAR FUSION  09/1996   Ray cage, L4-5 Dr. Mora   PROSTATE BIOPSY  ~ 2017   radical tonsillectomy Left 12/02/2017   Dr. [facility]alee at [facility]   RIGHT HEART CATH AND CORONARY ANGIOGRAPHY N/A 03/31/2020   Procedure: RIGHT HEART CATH AND CORONARY ANGIOGRAPHY;  Surgeon: Rolan Ezra RAMAN, MD;  Location: [facility] INVASIVE CV LAB;  Service: Cardiovascular;  Laterality: N/A;   RIGID BRONCHOSCOPY Left 10/09/2017   Procedure: RIGID BRONCHOSCOPY;  Surgeon: Arlana Arnt, MD;  Location: [facility];  Service: ENT;  Laterality: Left;   TEE WITHOUT CARDIOVERSION N/A 06/07/2020   Procedure: TRANSESOPHAGEAL ECHOCARDIOGRAM (TEE);  Surgeon: Wonda Sharper, MD;  Location: [facility] OR;  Service: Open Heart Surgery;  Laterality: N/A;   TONSILLECTOMY     TOTAL HIP ARTHROPLASTY Left 12/04/2020   Procedure: TOTAL HIP ARTHROPLASTY ANTERIOR APPROACH;  Surgeon: Fidel Rogue, MD;  Location: [facility] ORS;  Service: Orthopedics;  Laterality: Left;   TOTAL HIP ARTHROPLASTY Left 11/2020   TRACHEOSTOMY  12/02/2017   Dr. Lauralee- [facility]   ULTRASOUND GUIDANCE FOR VASCULAR ACCESS Right 06/07/2020   Procedure: ULTRASOUND GUIDANCE FOR VASCULAR ACCESS;  Surgeon: Wonda Sharper, MD;  Location: [facility] OR;  Service: Open Heart Surgery;  Laterality: Right;   ULTRASOUND GUIDANCE FOR VASCULAR ACCESS Bilateral 03/03/2021   Procedure: ULTRASOUND GUIDANCE FOR VASCULAR ACCESS, BILATERAL FEMORAL ARTERIES;  Surgeon: Sheree Penne Bruckner, MD;  Location: [facility] OR;  Service: Vascular;  Laterality: Bilateral;   VASCULAR SURGERY     Family History  Problem Relation Age of Onset   Heart disease Father 18       Living   Coronary artery disease Father        CABG   Alzheimer's disease Mother 52       Deceased    Arthritis Mother    Aneurysm Brother    Stomach cancer Maternal Uncle    Brain cancer Maternal Aunt        x2   Obesity Daughter        Had Bypass Sx   Outpatient Medications Prior to Visit  Medication Sig Dispense Refill   acetaminophen  (TYLENOL ) 500 MG tablet Take 1,000 mg by mouth every 8 (eight) hours as needed for mild pain or headache.     albuterol  (VENTOLIN  HFA) 108 (90 Base) MCG/ACT inhaler Inhale 2 puffs into the lungs every 6 (six) hours as needed for wheezing or shortness of breath. 6.7 g 2   allopurinol  (ZYLOPRIM ) 100 MG tablet Take 2 tablets (200 mg total) by mouth daily. 180 tablet 1   amLODipine  (NORVASC ) 10 MG tablet Take 1 tablet (10 mg total) by mouth daily. 90 tablet 3   aspirin  EC 81 MG tablet Take 81 mg by mouth at bedtime. Swallow whole.     atorvastatin  (LIPITOR ) 80 MG tablet Take 1 tablet (80 mg total) by mouth daily. 90 tablet 3   carvedilol  (COREG ) 12.5 MG tablet Take 1 tablet (12.5 mg total) by mouth 2 (two) times daily. 180 tablet 3   citalopram  (CELEXA ) 20 MG tablet 20 mg by oral route.     cyanocobalamin  (VITAMIN B12) 1000 MCG tablet Take 1 tablet (1,000 mcg total) by mouth daily. 90 tablet 3   dorzolamide -timolol  (COSOPT ) 2-0.5 % ophthalmic solution Place 1 drop into the left eye 2 (two) times daily. (Patient taking differently: Place 1 drop into the left eye daily as needed.) 1 mL 12   ezetimibe  (ZETIA ) 10 MG tablet TAKE 1 TABLET ONCE DAILY 90 tablet 3   feeding supplement (BOOST HIGH PROTEIN) LIQD Take 1 Container by mouth 3 (three) times daily between meals.     ferrous sulfate  325 (65 FE) MG EC tablet Take 1 tablet (325 mg total) by mouth daily with breakfast. 30 tablet 0   FLUoxetine  (PROZAC ) 20 MG capsule Take 1 capsule (20 mg total) by mouth daily. 90 capsule 3   fluticasone  (FLONASE ) 50 MCG/ACT nasal spray SHAKE LIQUID AND USE 2 SPRAYS IN EACH NOSTRIL DAILY AS NEEDED FOR RHINITIS OR ALLERGIES (Patient taking differently: Place 2 sprays into both  nostrils daily.) 16 g 11   furosemide  (LASIX ) 20 MG tablet Take 1 tablet (20 mg total) by mouth daily as needed for fluid or edema. (Patient taking differently: Take 20 mg by mouth as needed for fluid or edema.) 30 tablet 3   gabapentin  (NEURONTIN ) 300 MG capsule Take 1 capsule (300 mg total) by mouth 2 (two) times daily. 60 capsule 2   guaiFENesin  (MUCINEX ) 600 MG 12 hr tablet Take 600 mg by mouth 2 (two) times daily as needed for cough or to loosen phlegm.     hydrocortisone  cream 0.5 %  Apply 1 Application topically as needed.     hydrOXYzine  (ATARAX ) 10 MG tablet Take 1-2 tablets (10-20 mg total) by mouth at bedtime as needed for itching (or sleep). 30 tablet 3   ipratropium (ATROVENT ) 0.02 % nebulizer solution Use 1 vial (0.5 mg total) by nebulization every 6 (six) hours as needed for wheezing or shortness of breath. 75 mL 12   mirtazapine  (REMERON ) 7.5 MG tablet Take 1 tablet (7.5 mg total) by mouth at bedtime. 30 tablet 3   morphine  (MS CONTIN ) 15 MG 12 hr tablet Take 1 tablet (15 mg total) by mouth every 12 (twelve) hours. Fill today- 8/6 60 tablet 0   morphine  (MS CONTIN ) 15 MG 12 hr tablet Take 1 tablet (15 mg total) by mouth every 12 (twelve) hours. 60 tablet 0   naloxone  (NARCAN ) nasal spray 4 mg/0.1 mL For possible accidentally overdose. 2 each 5   oxyCODONE  10 MG TABS Take 1 tablet (10 mg total) by mouth every 4 (four) hours as needed for severe pain (pain score 7-10). Max 5 pills/day-  G89.3 chronic pain due to cancer 150 tablet 0   Oxycodone  HCl 10 MG TABS Take 1 tablet (10 mg total) by mouth every 4 (four) hours as needed. 150 tablet 0   pantoprazole  (PROTONIX ) 40 MG tablet Take 1 tablet (40 mg total) by mouth daily. 90 tablet 3   potassium chloride  (KLOR-CON ) 10 MEQ tablet Take 1 tablet (10 mEq total) by mouth 4 (four) times daily. 120 tablet 3   predniSONE  (DELTASONE ) 1 MG tablet Take 3 tablets (3 mg total) by mouth daily with breakfast. (Along with a 5mg  tablet) 270 tablet 0    predniSONE  (DELTASONE ) 5 MG tablet TAKE 1 TABLET(5 MG) BY MOUTH DAILY WITH BREAKFAST 90 tablet 0   Tiotropium Bromide  Monohydrate (SPIRIVA  RESPIMAT) 2.5 MCG/ACT AERS Inhale 2 puffs into the lungs daily. 4 g 6   traMADol  (ULTRAM ) 50 MG tablet      traZODone  (DESYREL ) 50 MG tablet TAKE 1/2 TO 1 TABLET(25 TO 50 MG) BY MOUTH AT BEDTIME AS NEEDED FOR SLEEP (Patient taking differently: as needed.) 30 tablet 3   triamcinolone  cream (KENALOG ) 0.1 % Apply 1 Application topically 2 (two) times daily as needed (to itchy sites).     trolamine salicylate (ASPERCREME) 10 % cream Apply 1 Application topically 2 (two) times daily as needed (arthritis pain).     tamsulosin  (FLOMAX ) 0.4 MG CAPS capsule TAKE 1 CAPSULE(0.4 MG) BY MOUTH DAILY 90 capsule 3   No facility-administered medications prior to visit.   Allergies  Allergen Reactions   Tape Other (See Comments)    Tears skin. Prefers paper tape, Please   Celebrex [Celecoxib] Hives and Itching     Objective:   Today's Vitals   10/01/23 1436  BP: 122/66  Pulse: 81  Temp: (!) 97.4 F (36.3 C)  TempSrc: Temporal  SpO2: 96%  Weight: 137 lb 6.4 oz (62.3 kg)  Height: 5' 8.5 (1.74 m)   Body mass index is 20.59 kg/m.   General: Thin. No acute distress. HEENT: Normocephalic, non-traumatic. PERRL, EOMI. Conjunctiva clear. External ears normal. EAC and TMs normal   bilaterally. Nose clear without congestion or rhinorrhea. Mucous membranes moist. Oropharynx clear without sign of   new tumors. Edentulous. Neck: Supple. Post surgical muscular changes int he left neck with firmness in the left supraclavicular space. No   masses.  No lymphadenopathy. No thyromegaly. Lungs: Clear to auscultation bilaterally. No wheezing, rales or rhonchi. CV: RRR without  murmurs or rubs. Pulses 2+ bilaterally. Abdomen: Soft, non-tender. Bowel sounds positive, normal pitch and frequency. No hepatosplenomegaly. No rebound   or guarding. Extremities: Unable to actively lift  left arm at shoulder. There is a defect where the biceps tendon should be in the   anterior shoulder joint.  Skin: Warm and dry. No rashes. Psych: Alert and oriented. Normal mood and affect.  Health Maintenance Due  Topic Date Due   Zoster Vaccines- Shingrix (1 of 2) Never done   Colonoscopy  03/22/2020   Lung Cancer Screening  08/18/2022    Lab Results    Latest Ref Rng & Units 09/25/2023    1:42 PM 01/24/2023    2:30 PM 08/22/2022    1:12 PM  CBC  WBC 3.8 - 10.8 Thousand/uL 7.7  7.0  8.9   Hemoglobin 13.2 - 17.1 g/dL 89.2  88.3  88.4   Hematocrit 38.5 - 50.0 % 34.7  36.6  37.8   Platelets 140 - 400 Thousand/uL 138  112  142       Latest Ref Rng & Units 09/25/2023    1:42 PM 06/12/2023    4:00 PM 01/24/2023    2:30 PM  CMP  Glucose 65 - 99 mg/dL 866  880  833   BUN 7 - 25 mg/dL 17  19  12    Creatinine 0.70 - 1.22 mg/dL 9.39  9.24  9.33   Sodium 135 - 146 mmol/L 138  136  139   Potassium 3.5 - 5.3 mmol/L 4.3  4.4  3.7   Chloride 98 - 110 mmol/L 103  99  101   CO2 20 - 32 mmol/L 28  26  31    Calcium  8.6 - 10.3 mg/dL 8.5  8.8  8.1   Total Protein 6.1 - 8.1 g/dL 5.7  6.3  5.5   Total Bilirubin 0.2 - 1.2 mg/dL 0.4  0.7  0.8   Alkaline Phos 38 - 126 U/L  91    AST 10 - 35 U/L 10  12  9    ALT 9 - 46 U/L 9  17  6     Last hemoglobin A1c Lab Results  Component Value Date   HGBA1C 6.1 (H) 09/25/2023     Assessment & Plan:   Problem List Items Addressed This Visit       Cardiovascular and Mediastinum   Chronic diastolic heart failure (HCC)   Compensated. Continue carvedilol  12.5 mg twice a day and furosemide  20 mg daily.      Coronary artery disease   I will defer cardiac clearance for surgery to [facility]. [facility] cardiologist.      Essential hypertension   Blood pressure is in good control. Continue amlodipine  10 mg daily and carvedilol  12.5 mg twice daily.        Respiratory   Chronic obstructive pulmonary disease (HCC)   Oxygen saturation is good. Continue tiotropium  daily and albuterol  as needed. I recommend we assess a chest x-ray prior to surgical clearance.      Relevant Orders   DG Chest 2 View     Musculoskeletal and Integument   Primary osteoarthritis, left shoulder   Plan for reverse total shoulder joint replacement.      Rupture of left biceps tendon   Plan for operative repair.        Genitourinary   Benign prostatic hyperplasia   Stable. Continue tamsulosin  0.4 mg daily.      Relevant Medications   tamsulosin  (FLOMAX ) 0.4 MG CAPS  capsule     Other   Anemia of chronic disease   Persistent mild to moderate anemia, likely of chronic disease. I will reassess his vitamin B12 level.      Preoperative examination - Primary   Mr. Matteucci is at moderate risk for complications. I recommend that he have regional anesthesia (such as a scalene block) with procedural sedation rather than general anesthesia. His prior tongue cancer with surgery and radiation could make management of an ET tube difficult. Do recommend cardiology clearance as well. He is at moderate risk for healing issues in light of his weight loss, low protein/albumin , and chronic prednisone  use. His chronic anemia places him at risk for worsening anemia, so this should be followed closely in the postoperative period.      Relevant Orders   DG Chest 2 View   Tobacco abuse disorder   I strongly advise smoking cessation, esp. for the week prior to his surgery and during his recovery.      Relevant Orders   DG Chest 2 View   Vitamin B12 deficiency   I will reassess a Vitamin B12 level. Apparently, Brandon [name] has not been taking a B12 supplement as previously recommended.      Relevant Orders   Vitamin B12    Return in about 3 months (around 01/01/2024) for Reassessment.   Garnette CHRISTELLA Simpler, MD

## 2023-10-01 NOTE — Assessment & Plan Note (Signed)
 Plan for operative repair.

## 2023-10-01 NOTE — Assessment & Plan Note (Signed)
Stable. Continue tamsulosin 0.4 mg daily.

## 2023-10-01 NOTE — Assessment & Plan Note (Signed)
 Plan for reverse total shoulder joint replacement.

## 2023-10-01 NOTE — Assessment & Plan Note (Signed)
 Blood pressure is in good control. Continue amlodipine 10 mg daily and carvedilol 12.5 mg twice daily.

## 2023-10-01 NOTE — Assessment & Plan Note (Signed)
 I strongly advise smoking cessation, esp. for the week prior to his surgery and during his recovery.

## 2023-10-01 NOTE — Assessment & Plan Note (Signed)
 Persistent mild to moderate anemia, likely of chronic disease. I will reassess his vitamin B12 level.

## 2023-10-01 NOTE — Assessment & Plan Note (Signed)
 Compensated. Continue carvedilol  12.5 mg twice a day and furosemide  20 mg daily.

## 2023-10-01 NOTE — Assessment & Plan Note (Signed)
 Oxygen saturation is good. Continue tiotropium daily and albuterol  as needed. I recommend we assess a chest x-ray prior to surgical clearance.

## 2023-10-01 NOTE — Assessment & Plan Note (Signed)
 Brandon [name] is at moderate risk for complications. I recommend that he have regional anesthesia (such as a scalene block) with procedural sedation rather than general anesthesia. His prior tongue cancer with surgery and radiation could make management of an ET tube difficult. Do recommend cardiology clearance as well. He is at moderate risk for healing issues in light of his weight loss, low protein/albumin , and chronic prednisone  use. His chronic anemia places him at risk for worsening anemia, so this should be followed closely in the postoperative period.

## 2023-10-01 NOTE — Assessment & Plan Note (Signed)
 I will reassess a Vitamin B12 level. Apparently, Brandon [name] has not been taking a B12 supplement as previously recommended.

## 2023-10-01 NOTE — Progress Notes (Signed)
 Reclast  scheduled for 10/08/2023

## 2023-10-02 ENCOUNTER — Ambulatory Visit: Attending: Cardiovascular Disease

## 2023-10-02 ENCOUNTER — Ambulatory Visit: Payer: Self-pay | Admitting: Family Medicine

## 2023-10-02 ENCOUNTER — Ambulatory Visit

## 2023-10-02 DIAGNOSIS — J449 Chronic obstructive pulmonary disease, unspecified: Secondary | ICD-10-CM | POA: Diagnosis not present

## 2023-10-02 DIAGNOSIS — Z01810 Encounter for preprocedural cardiovascular examination: Secondary | ICD-10-CM

## 2023-10-02 DIAGNOSIS — Z720 Tobacco use: Secondary | ICD-10-CM

## 2023-10-02 DIAGNOSIS — Z01818 Encounter for other preprocedural examination: Secondary | ICD-10-CM | POA: Diagnosis not present

## 2023-10-02 DIAGNOSIS — J439 Emphysema, unspecified: Secondary | ICD-10-CM

## 2023-10-02 DIAGNOSIS — A419 Sepsis, unspecified organism: Secondary | ICD-10-CM | POA: Diagnosis not present

## 2023-10-02 DIAGNOSIS — I700 Atherosclerosis of aorta: Secondary | ICD-10-CM | POA: Diagnosis not present

## 2023-10-02 DIAGNOSIS — J189 Pneumonia, unspecified organism: Secondary | ICD-10-CM | POA: Diagnosis not present

## 2023-10-02 LAB — VITAMIN B12: Vitamin B-12: 149 pg/mL — ABNORMAL LOW (ref 211–911)

## 2023-10-02 NOTE — Progress Notes (Signed)
 Virtual Visit via Telephone Note   Because of Gust L Revell co-morbid illnesses, he is at least at moderate risk for complications without adequate follow up.  This format is felt to be most appropriate for this patient at this time.  Due to technical limitations with video connection (technology), today's appointment will be conducted as an audio only telehealth visit, and Brandon [name] verbally agreed to proceed in this manner.   All issues noted in this document were discussed and addressed.  No physical exam could be performed with this format.  Evaluation Performed:  Preoperative cardiovascular risk assessment _____________   Date:  10/02/2023   Patient ID:  Brandon [name], Brandon [name] January 12, 1941, MRN [id number] Patient Location:  Home Provider location:   Office  Primary Care Provider:  Thedora Garnette HERO, MD Primary Cardiologist:  Ezra Shuck, MD  Chief Complaint / Patient Profile   83 y.o. y/o male with a h/o HTN, HLD, CAD, AS s/p TAVR 2022, AAA s/p repair 2023, HFrEF, carotid stenosis who is pending left reverse total shoulder arthroplasty and presents today for telephonic preoperative cardiovascular risk assessment.  History of Present Illness    Brandon [name] is a 83 y.o. male who presents via audio/video conferencing for a telehealth visit today.  Pt was last seen in cardiology clinic on 06/12/2023 by Harlene Gainer, NP.  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  He was staying active and had lost 20 pounds intentionally.  The patient is now pending procedure as outlined above. Since his last visit, he has been doing well with no new cardiac complaints.  He denies chest pain, shortness of breath, lower extremity edema, fatigue, palpitations, melena, hematuria, hemoptysis, diaphoresis, weakness, presyncope, syncope, orthopnea, and PND.   Past Medical History    Past Medical History:  Diagnosis Date   Abdominal aneurysm (HCC)     Arthritis    all over (07/19/2016)   BPH (benign prostatic hypertrophy)    CAD (coronary artery disease)    Chicken pox    Chronic lower back pain    s/p surgical fusion   Depression    Diverticulosis    Esophageal stricture    GERD (gastroesophageal reflux disease)    Hepatitis B 1984   Hiatal hernia    History of radiation therapy 01/16/18- 03/05/18   Left Tonsil, 66 Gy in 33 fractions to high risk nodal echelons.    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 Wonda and Dr Lucas    Severe aortic stenosis    TIA (transient ischemic attack) 1990s   hx   tonsillar ca dx'd 11/2017   Tubular adenoma of colon 2009   Past Surgical History:  Procedure Laterality Date   ABDOMINAL AORTIC ENDOVASCULAR STENT GRAFT N/A 03/03/2021   Procedure: ABDOMINAL AORTIC ENDOVASCULAR STENT GRAFT;  Surgeon: Sheree Penne Bruckner, MD;  Location: [facility] OR;  Service: Vascular;  Laterality: N/A;   ABDOMINAL AORTOGRAM W/LOWER EXTREMITY N/A 04/10/2021   Procedure: ABDOMINAL AORTOGRAM W/LOWER EXTREMITY;  Surgeon: Sheree Penne Bruckner, MD;  Location: [facility] INVASIVE CV LAB;  Service: Cardiovascular;  Laterality: N/A;   BACK SURGERY     CARDIAC CATHETERIZATION     CATARACT EXTRACTION W/ INTRAOCULAR LENS  IMPLANT, BILATERAL Bilateral 01/2012 -  02/2012   CORONARY ATHERECTOMY N/A 04/27/2020   Procedure: CORONARY ATHERECTOMY;  Surgeon: Wonda Sharper, MD;  Location: [facility] INVASIVE CV LAB;  Service: Cardiovascular;  Laterality: N/A;   CORONARY IMAGING/OCT N/A 04/27/2020   Procedure: INTRAVASCULAR IMAGING/OCT;  Surgeon: Wonda Sharper, MD;  Location: [facility] INVASIVE CV LAB;  Service: Cardiovascular;  Laterality: N/A;   CORONARY PRESSURE/FFR STUDY N/A 04/27/2020   Procedure: INTRAVASCULAR PRESSURE WIRE/FFR STUDY;  Surgeon: Wonda Sharper,  MD;  Location: [facility] INVASIVE CV LAB;  Service: Cardiovascular;  Laterality: N/A;   CORONARY STENT INTERVENTION N/A 04/27/2020   Procedure: CORONARY STENT INTERVENTION;  Surgeon: Wonda Sharper, MD;  Location: [facility] INVASIVE CV LAB;  Service: Cardiovascular;  Laterality: N/A;   DIRECT LARYNGOSCOPY Left 10/09/2017   Procedure: DIRECT LARYNGOSCOPY WITH BOPSY;  Surgeon: Arlana Arnt, MD;  Location: [facility];  Service: ENT;  Laterality: Left;   ESOPHAGOGASTRODUODENOSCOPY (EGD) WITH ESOPHAGEAL DILATION     couple times (07/19/2016)   ESOPHAGOSCOPY Left 10/09/2017   Procedure: ESOPHAGOSCOPY;  Surgeon: Arlana Arnt, MD;  Location: [facility];  Service: ENT;  Laterality: Left;   IR GASTROSTOMY TUBE MOD SED  01/08/2018   IR THORACENTESIS ASP PLEURAL SPACE W/IMG GUIDE  07/19/2016   LAPAROSCOPIC CHOLECYSTECTOMY  1984   LUMBAR DISC SURGERY  05/1996   L4-5; Dr. Alix ILES LAMINECTOMY/DECOMPRESSION MICRODISCECTOMY  10/2002   L3-4. Dr. Alix   MULTIPLE TOOTH EXTRACTIONS  1980s   PARTIAL GLOSSECTOMY  12/02/2017   Dr. Lauralee- [facility]   pharyngoplasty for closure of tingue base defect  12/02/2017   Dr. Lauralee- [facility]   PICC LINE INSERTION  07/15/2016   POSTERIOR LUMBAR FUSION  09/1996   Ray cage, L4-5 Dr. Mora   PROSTATE BIOPSY  ~ 2017   radical tonsillectomy Left 12/02/2017   Dr. Lauralee at [facility]   RIGHT HEART CATH AND CORONARY ANGIOGRAPHY N/A 03/31/2020   Procedure: RIGHT HEART CATH AND CORONARY ANGIOGRAPHY;  Surgeon: Rolan Ezra RAMAN, MD;  Location: [facility] INVASIVE CV LAB;  Service: Cardiovascular;  Laterality: N/A;   RIGID BRONCHOSCOPY Left 10/09/2017   Procedure: RIGID BRONCHOSCOPY;  Surgeon: Arlana Arnt, MD;  Location: [facility];  Service: ENT;  Laterality: Left;   TEE WITHOUT CARDIOVERSION N/A 06/07/2020   Procedure: TRANSESOPHAGEAL ECHOCARDIOGRAM (TEE);  Surgeon: Wonda Sharper, MD;  Location: [facility] OR;  Service: Open Heart Surgery;   Laterality: N/A;   TONSILLECTOMY     TOTAL HIP ARTHROPLASTY Left 12/04/2020   Procedure: TOTAL HIP ARTHROPLASTY ANTERIOR APPROACH;  Surgeon: Fidel Rogue, MD;  Location: [facility] ORS;  Service: Orthopedics;  Laterality: Left;   TOTAL HIP ARTHROPLASTY Left 11/2020   TRACHEOSTOMY  12/02/2017   Dr. [facility]alee- [facility]   ULTRASOUND GUIDANCE FOR VASCULAR ACCESS Right 06/07/2020   Procedure: ULTRASOUND GUIDANCE FOR VASCULAR ACCESS;  Surgeon: Wonda Sharper, MD;  Location: [facility] OR;  Service: Open Heart Surgery;  Laterality: Right;   ULTRASOUND GUIDANCE FOR VASCULAR ACCESS Bilateral 03/03/2021   Procedure: ULTRASOUND GUIDANCE FOR VASCULAR ACCESS, BILATERAL FEMORAL ARTERIES;  Surgeon: Sheree Penne Bruckner, MD;  Location: [facility] OR;  Service: Vascular;  Laterality: Bilateral;   VASCULAR SURGERY      Allergies  Allergies  Allergen Reactions   Tape Other (See Comments)    Tears skin. Prefers paper tape, Please   Celebrex [Celecoxib] Hives and Itching    Home Medications    Prior to Admission medications   Medication Sig Start Date End Date Taking? Authorizing Provider  acetaminophen  (TYLENOL ) 500 MG tablet Take 1,000  mg by mouth every 8 (eight) hours as needed for mild pain or headache.    [provider]  albuterol  (VENTOLIN  HFA) 108 (90 Base) MCG/ACT inhaler Inhale 2 puffs into the lungs every 6 (six) hours as needed for wheezing or shortness of breath. 03/12/23   Thedora Garnette HERO, MD  allopurinol  (ZYLOPRIM ) 100 MG tablet Take 2 tablets (200 mg total) by mouth daily. 05/28/23   Dolphus Reiter, MD  amLODipine  (NORVASC ) 10 MG tablet Take 1 tablet (10 mg total) by mouth daily. 06/12/23   Glena Harlene HERO, FNP  aspirin  EC 81 MG tablet Take 81 mg by mouth at bedtime. Swallow whole.    [provider]  atorvastatin  (LIPITOR ) 80 MG tablet Take 1 tablet (80 mg total) by mouth daily. 06/12/23   Milford, Harlene HERO, FNP  carvedilol  (COREG ) 12.5 MG tablet Take 1 tablet (12.5 mg total) by mouth 2  (two) times daily. 06/12/23   Milford, Harlene HERO, FNP  citalopram  (CELEXA ) 20 MG tablet 20 mg by oral route.    [provider]  cyanocobalamin  (VITAMIN B12) 1000 MCG tablet Take 1 tablet (1,000 mcg total) by mouth daily. 02/11/23   Thedora Garnette HERO, MD  dorzolamide -timolol  (COSOPT ) 2-0.5 % ophthalmic solution Place 1 drop into the left eye 2 (two) times daily. Patient taking differently: Place 1 drop into the left eye daily as needed. 03/16/23   Ruthell Lonni FALCON, PA-C  ezetimibe  (ZETIA ) 10 MG tablet TAKE 1 TABLET ONCE DAILY 06/12/23   Milford, Harlene HERO, FNP  feeding supplement (BOOST HIGH PROTEIN) LIQD Take 1 Container by mouth 3 (three) times daily between meals.    [provider]  ferrous sulfate  325 (65 FE) MG EC tablet Take 1 tablet (325 mg total) by mouth daily with breakfast. 01/20/22 10/01/23  Cindy Garnette POUR, MD  FLUoxetine  (PROZAC ) 20 MG capsule Take 1 capsule (20 mg total) by mouth daily. 02/11/23   Thedora Garnette HERO, MD  fluticasone  (FLONASE ) 50 MCG/ACT nasal spray SHAKE LIQUID AND USE 2 SPRAYS IN EACH NOSTRIL DAILY AS NEEDED FOR RHINITIS OR ALLERGIES Patient taking differently: Place 2 sprays into both nostrils daily. 11/14/20   Thedora Garnette HERO, MD  furosemide  (LASIX ) 20 MG tablet Take 1 tablet (20 mg total) by mouth daily as needed for fluid or edema. Patient taking differently: Take 20 mg by mouth as needed for fluid or edema. 06/12/23   Milford, Harlene HERO, FNP  gabapentin  (NEURONTIN ) 300 MG capsule Take 1 capsule (300 mg total) by mouth 2 (two) times daily. 07/12/23   Debby Fidela CROME, NP  guaiFENesin  (MUCINEX ) 600 MG 12 hr tablet Take 600 mg by mouth 2 (two) times daily as needed for cough or to loosen phlegm.    [provider]  hydrocortisone  cream 0.5 % Apply 1 Application topically as needed.    [provider]  hydrOXYzine  (ATARAX ) 10 MG tablet Take 1-2 tablets (10-20 mg total) by mouth at bedtime as needed for itching (or sleep). 01/09/23   Thedora Garnette HERO, MD  ipratropium (ATROVENT ) 0.02 % nebulizer solution Use 1 vial (0.5 mg total) by nebulization every 6 (six) hours as needed for wheezing or shortness of breath. 08/21/21   Sherrill Cable Latif, DO  mirtazapine  (REMERON ) 7.5 MG tablet Take 1 tablet (7.5 mg total) by mouth at bedtime. 07/08/23   Thedora Garnette HERO, MD  morphine  (MS CONTIN ) 15 MG 12 hr tablet Take 1 tablet (15 mg total) by mouth every 12 (twelve) hours. Fill today-  8/6 09/11/23   Lovorn, Megan, MD  morphine  (MS CONTIN ) 15 MG 12 hr tablet Take 1 tablet (15 mg total) by mouth every 12 (twelve) hours. 09/11/23   Lovorn, Megan, MD  naloxone  (NARCAN ) nasal spray 4 mg/0.1 mL For possible accidentally overdose. 02/12/22   Lovorn, Megan, MD  oxyCODONE  10 MG TABS Take 1 tablet (10 mg total) by mouth every 4 (four) hours as needed for severe pain (pain score 7-10). 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 09/11/23   Lovorn, Megan, MD  pantoprazole  (PROTONIX ) 40 MG tablet Take 1 tablet (40 mg total) by mouth daily. 07/08/23 10/06/23  Thedora Garnette HERO, MD  potassium chloride  (KLOR-CON ) 10 MEQ tablet Take 1 tablet (10 mEq total) by mouth 4 (four) times daily. 06/12/23   Glena Harlene HERO, FNP  predniSONE  (DELTASONE ) 1 MG tablet Take 3 tablets (3 mg total) by mouth daily with breakfast. (Along with a 5mg  tablet) 08/13/23   Cheryl Waddell HERO, PA-C  predniSONE  (DELTASONE ) 5 MG tablet TAKE 1 TABLET(5 MG) BY MOUTH DAILY WITH BREAKFAST 09/25/23   Cheryl Waddell HERO, PA-C  tamsulosin  (FLOMAX ) 0.4 MG CAPS capsule Take 1 capsule (0.4 mg total) by mouth daily. 10/01/23   Thedora Garnette HERO, MD  Tiotropium Bromide  Monohydrate (SPIRIVA  RESPIMAT) 2.5 MCG/ACT AERS Inhale 2 puffs into the lungs daily. 03/12/23   Thedora Garnette HERO, MD  traMADol  (ULTRAM ) 50 MG tablet     [provider]  traZODone  (DESYREL ) 50 MG tablet TAKE 1/2 TO 1 TABLET(25 TO 50 MG) BY MOUTH  AT BEDTIME AS NEEDED FOR SLEEP Patient taking differently: as needed. 05/06/23   Thedora Garnette HERO, MD  triamcinolone  cream (KENALOG ) 0.1 % Apply 1 Application topically 2 (two) times daily as needed (to itchy sites).    [provider]  trolamine salicylate (ASPERCREME) 10 % cream Apply 1 Application topically 2 (two) times daily as needed (arthritis pain).    [provider]    Physical Exam    Vital Signs:  Brandon [name] does not have vital signs available for review today.122/66  Given telephonic nature of communication, physical exam is limited. AAOx3. NAD. Normal affect.  Speech and respirations are unlabored.  Accessory Clinical Findings    None  Assessment & Plan    1.  Preoperative Cardiovascular Risk Assessment: - Patient is RCRI score 6.6%  The patient affirms he has been doing well without any new cardiac symptoms. They are able to achieve 6 METS without cardiac limitations. Therefore, based on ACC/AHA guidelines, the patient would be at acceptable risk for the planned procedure without further cardiovascular testing. The patient was advised that if he develops new symptoms prior to surgery to contact our office to arrange for a follow-up visit, and he verbalized understanding.   The patient was advised that if he develops new symptoms prior to surgery to contact our office to arrange for a follow-up visit, and he verbalized understanding.   He may hold ASA for 7 days prior to procedure   A copy of this note will be routed to requesting surgeon.  Time:   Today, I have spent 8 minutes with the patient with telehealth technology discussing medical history, symptoms, and management plan.     Wyn Raddle, Jackee Shove, NP  10/02/2023, 7:23 AM

## 2023-10-08 ENCOUNTER — Ambulatory Visit (INDEPENDENT_AMBULATORY_CARE_PROVIDER_SITE_OTHER)

## 2023-10-08 VITALS — BP 137/69 | HR 64 | Temp 98.4°F | Resp 16 | Ht 68.5 in | Wt 136.4 lb

## 2023-10-08 DIAGNOSIS — Z8781 Personal history of (healed) traumatic fracture: Secondary | ICD-10-CM | POA: Diagnosis not present

## 2023-10-08 DIAGNOSIS — M961 Postlaminectomy syndrome, not elsewhere classified: Secondary | ICD-10-CM

## 2023-10-08 DIAGNOSIS — M810 Age-related osteoporosis without current pathological fracture: Secondary | ICD-10-CM | POA: Diagnosis not present

## 2023-10-08 MED ORDER — ACETAMINOPHEN 325 MG PO TABS
650.0000 mg | ORAL_TABLET | Freq: Once | ORAL | Status: AC
  Administered 2023-10-08: 650 mg via ORAL
  Filled 2023-10-08: qty 2

## 2023-10-08 MED ORDER — ZOLEDRONIC ACID 5 MG/100ML IV SOLN
5.0000 mg | Freq: Once | INTRAVENOUS | Status: AC
  Administered 2023-10-08: 5 mg via INTRAVENOUS
  Filled 2023-10-08: qty 100

## 2023-10-08 MED ORDER — SODIUM CHLORIDE 0.9 % IV SOLN: INTRAVENOUS | Status: DC

## 2023-10-08 MED ORDER — DIPHENHYDRAMINE HCL 25 MG PO CAPS
25.0000 mg | ORAL_CAPSULE | Freq: Once | ORAL | Status: AC
  Administered 2023-10-08: 25 mg via ORAL
  Filled 2023-10-08: qty 1

## 2023-10-08 NOTE — Progress Notes (Signed)
 Diagnosis: Osteoporosis  Provider:  Lonna Coder MD  Procedure: IV Infusion  IV Type: Peripheral, IV Location: R Antecubital  Reclast  (Zolendronic Acid), Dose: 5 mg  Infusion Start Time: 1407  Infusion Stop Time: 1436  Post Infusion IV Care: Patient declined observation and Peripheral IV Discontinued  Discharge: Condition: Good, Destination: Home . AVS Provided  Performed by:  Leita FORBES Miles, LPN

## 2023-10-09 NOTE — Addendum Note (Signed)
 Addended by: DAYNE SHERRY RAMAN on: 10/09/2023 09:27 AM   Modules accepted: Orders

## 2023-10-10 ENCOUNTER — Other Ambulatory Visit (HOSPITAL_COMMUNITY): Payer: Self-pay | Admitting: Family Medicine

## 2023-10-10 ENCOUNTER — Other Ambulatory Visit: Payer: Self-pay | Admitting: Registered Nurse

## 2023-11-06 ENCOUNTER — Encounter: Payer: Self-pay | Admitting: Registered Nurse

## 2023-11-06 ENCOUNTER — Encounter: Attending: Physical Medicine and Rehabilitation | Admitting: Registered Nurse

## 2023-11-06 VITALS — BP 138/65 | HR 76 | Ht 68.5 in | Wt 140.0 lb

## 2023-11-06 DIAGNOSIS — Z79891 Long term (current) use of opiate analgesic: Secondary | ICD-10-CM | POA: Insufficient documentation

## 2023-11-06 DIAGNOSIS — M25512 Pain in left shoulder: Secondary | ICD-10-CM | POA: Insufficient documentation

## 2023-11-06 DIAGNOSIS — Z5181 Encounter for therapeutic drug level monitoring: Secondary | ICD-10-CM | POA: Diagnosis not present

## 2023-11-06 DIAGNOSIS — M47816 Spondylosis without myelopathy or radiculopathy, lumbar region: Secondary | ICD-10-CM | POA: Diagnosis not present

## 2023-11-06 DIAGNOSIS — G8929 Other chronic pain: Secondary | ICD-10-CM | POA: Diagnosis not present

## 2023-11-06 DIAGNOSIS — G894 Chronic pain syndrome: Secondary | ICD-10-CM | POA: Insufficient documentation

## 2023-11-06 DIAGNOSIS — M5416 Radiculopathy, lumbar region: Secondary | ICD-10-CM | POA: Insufficient documentation

## 2023-11-06 MED ORDER — MORPHINE SULFATE ER 15 MG PO TBCR: 15.0000 mg | EXTENDED_RELEASE_TABLET | Freq: Two times a day (BID) | ORAL | 0 refills | Status: DC

## 2023-11-06 MED ORDER — OXYCODONE HCL 10 MG PO TABS: 10.0000 mg | ORAL_TABLET | ORAL | 0 refills | Status: DC | PRN

## 2023-11-06 NOTE — Progress Notes (Signed)
 Subjective:    Patient ID: Brandon [name], male    DOB: [birth year], 83 y.o.   MRN: [id number]  HPI: Brandon [name] is a 83 y.o. male who returns for follow up appointment for chronic pain and medication refill. He states his pain is located in his left shoulder lower back pain and occasionally radiating into his right lower extremity. He rates his pain 5. His current exercise regime is walking and performing stretching exercises.  Brandon [name] Morphine  equivalent is 105.00 MME.   Oral Swab was Performed today.      Pain Inventory Average Pain 5 Pain Right Now 5 My pain is intermittent, constant, dull, and cramp, sharp  In the last 24 hours, has pain interfered with the following? General activity 6 Relation with others 6 Enjoyment of life 7 What TIME of day is your pain at its worst? morning  and daytime Sleep (in general) Fair  Pain is worse with: walking, bending, and standing Pain improves with: rest, heat/ice, medication, and injections Relief from Meds: 7  Family History  Problem Relation Age of Onset   Heart disease Father 63       Living   Coronary artery disease Father        CABG   Alzheimer's disease Mother 66       Deceased   Arthritis Mother    Aneurysm Brother    Stomach cancer Maternal Uncle    Brain cancer Maternal Aunt        x2   Obesity Daughter        Had Bypass Sx   Social History   Socioeconomic History   Marital status: Married    Spouse name: etta   Number of children: 2   Years of education: Not on file   Highest education level: Not on file  Occupational History   Occupation: retired    Associate Professor: RETIRED    Comment: disabled due to back problems  Tobacco Use   Smoking status: Former    Current packs/day: 0.00    Average packs/day: 1.5 packs/day for 50.0 years (75.0 ttl pk-yrs)    Types: Cigarettes    Start date: 09/06/1967    Quit date: 09/05/2017    Years since quitting: 6.1    Passive exposure: Never   Smokeless tobacco: Never   Tobacco  comments:    smoked less than 1 ppd for 40+ years;   Vaping Use   Vaping status: Never Used  Substance and Sexual Activity   Alcohol  use: Yes    Comment: rarely   Drug use: Yes    Types: Oxycodone , Morphine     Comment: has prescription   Sexual activity: Not Currently  Other Topics Concern   Not on file  Social History Narrative   ** Merged History Encounter **       Married (3rd), [place]. 2 children from 1st marriage, 4 step children.    Retired on disability due to back    Former Runner, broadcasting/film/video.   restores antique furniture for a hobby.       Cell # [phone]   Social Drivers of Health   Financial Resource Strain: Low Risk  (06/21/2023)   Overall Financial Resource Strain (CARDIA)    Difficulty of Paying Living Expenses: Not hard at all  Food Insecurity: No Food Insecurity (06/21/2023)   Hunger Vital Sign    Worried About Running Out of Food in the Last Year: Never true    Ran Out  of Food in the Last Year: Never true  Transportation Needs: No Transportation Needs (06/21/2023)   PRAPARE - Administrator, Civil Service (Medical): No    Lack of Transportation (Non-Medical): No  Physical Activity: Inactive (06/21/2023)   Exercise Vital Sign    Days of Exercise per Week: 0 days    Minutes of Exercise per Session: 0 min  Stress: No Stress Concern Present (06/21/2023)   [employer] of Occupational Health - Occupational Stress Questionnaire    Feeling of Stress : Not at all  Social Connections: Moderately Integrated (06/21/2023)   Social Connection and Isolation Panel    Frequency of Communication with Friends and Family: More than three times a week    Frequency of Social Gatherings with Friends and Family: Three times a week    Attends Religious Services: More than 4 times per year    Active Member of Clubs or Organizations: No    Attends Banker Meetings: Never    Marital Status: Married   Past Surgical History:  Procedure  Laterality Date   ABDOMINAL AORTIC ENDOVASCULAR STENT GRAFT N/A 03/03/2021   Procedure: ABDOMINAL AORTIC ENDOVASCULAR STENT GRAFT;  Surgeon: Sheree Penne Bruckner, MD;  Location: [facility] OR;  Service: Vascular;  Laterality: N/A;   ABDOMINAL AORTOGRAM W/LOWER EXTREMITY N/A 04/10/2021   Procedure: ABDOMINAL AORTOGRAM W/LOWER EXTREMITY;  Surgeon: Sheree Penne Bruckner, MD;  Location: [facility] INVASIVE CV LAB;  Service: Cardiovascular;  Laterality: N/A;   BACK SURGERY     CARDIAC CATHETERIZATION     CATARACT EXTRACTION W/ INTRAOCULAR LENS  IMPLANT, BILATERAL Bilateral 01/2012 - 02/2012   CORONARY ATHERECTOMY N/A 04/27/2020   Procedure: CORONARY ATHERECTOMY;  Surgeon: Wonda Sharper, MD;  Location: [facility] INVASIVE CV LAB;  Service: Cardiovascular;  Laterality: N/A;   CORONARY IMAGING/OCT N/A 04/27/2020   Procedure: INTRAVASCULAR IMAGING/OCT;  Surgeon: Wonda Sharper, MD;  Location: [facility] INVASIVE CV LAB;  Service: Cardiovascular;  Laterality: N/A;   CORONARY PRESSURE/FFR STUDY N/A 04/27/2020   Procedure: INTRAVASCULAR PRESSURE WIRE/FFR STUDY;  Surgeon: Wonda Sharper, MD;  Location: [facility] INVASIVE CV LAB;  Service: Cardiovascular;  Laterality: N/A;   CORONARY STENT INTERVENTION N/A 04/27/2020   Procedure: CORONARY STENT INTERVENTION;  Surgeon: Wonda Sharper, MD;  Location: [facility] INVASIVE CV LAB;  Service: Cardiovascular;  Laterality: N/A;   DIRECT LARYNGOSCOPY Left 10/09/2017   Procedure: DIRECT LARYNGOSCOPY WITH BOPSY;  Surgeon: Arlana Arnt, MD;  Location: [facility];  Service: ENT;  Laterality: Left;   ESOPHAGOGASTRODUODENOSCOPY (EGD) WITH ESOPHAGEAL DILATION     couple times (07/19/2016)   ESOPHAGOSCOPY Left 10/09/2017   Procedure: ESOPHAGOSCOPY;  Surgeon: Arlana Arnt, MD;  Location: [facility];  Service: ENT;  Laterality: Left;   IR GASTROSTOMY TUBE MOD SED  01/08/2018   IR THORACENTESIS ASP PLEURAL SPACE W/IMG GUIDE  07/19/2016   LAPAROSCOPIC CHOLECYSTECTOMY  1984    LUMBAR DISC SURGERY  05/1996   L4-5; Dr. Alix ILES LAMINECTOMY/DECOMPRESSION MICRODISCECTOMY  10/2002   L3-4. Dr. Alix   MULTIPLE TOOTH EXTRACTIONS  1980s   PARTIAL GLOSSECTOMY  12/02/2017   Dr. Lauralee- [facility]   pharyngoplasty for closure of tingue base defect  12/02/2017   [facility]   PICC LINE INSERTION  07/15/2016   POSTERIOR LUMBAR FUSION  09/1996   Ray cage, L4-5 Dr. Mora   PROSTATE BIOPSY  ~ 2017   radical tonsillectomy Left 12/02/2017   Dr. [facility]alee at [facility]   RIGHT HEART CATH AND CORONARY ANGIOGRAPHY N/A 03/31/2020  Procedure: RIGHT HEART CATH AND CORONARY ANGIOGRAPHY;  Surgeon: Rolan Ezra RAMAN, MD;  Location: [facility] INVASIVE CV LAB;  Service: Cardiovascular;  Laterality: N/A;   RIGID BRONCHOSCOPY Left 10/09/2017   Procedure: RIGID BRONCHOSCOPY;  Surgeon: Arlana Arnt, MD;  Location: [facility];  Service: ENT;  Laterality: Left;   TEE WITHOUT CARDIOVERSION N/A 06/07/2020   Procedure: TRANSESOPHAGEAL ECHOCARDIOGRAM (TEE);  Surgeon: Wonda Sharper, MD;  Location: [facility] OR;  Service: Open Heart Surgery;  Laterality: N/A;   TONSILLECTOMY     TOTAL HIP ARTHROPLASTY Left 12/04/2020   Procedure: TOTAL HIP ARTHROPLASTY ANTERIOR APPROACH;  Surgeon: Fidel Rogue, MD;  Location: [facility]S;  Service: Orthopedics;  Laterality: Left;   TOTAL HIP ARTHROPLASTY Left 11/2020   TRACHEOSTOMY  12/02/2017   Dr. Lauralee- [facility]   ULTRASOUND GUIDANCE FOR VASCULAR ACCESS Right 06/07/2020   Procedure: ULTRASOUND GUIDANCE FOR VASCULAR ACCESS;  Surgeon: Wonda Sharper, MD;  Location: [facility] OR;  Service: Open Heart Surgery;  Laterality: Right;   ULTRASOUND GUIDANCE FOR VASCULAR ACCESS Bilateral 03/03/2021   Procedure: ULTRASOUND GUIDANCE FOR VASCULAR ACCESS, BILATERAL FEMORAL ARTERIES;  Surgeon: Sheree Penne Bruckner, MD;  Location: [facility] OR;  Service: Vascular;  Laterality: Bilateral;   VASCULAR SURGERY     Past Surgical History:  Procedure Laterality Date    ABDOMINAL AORTIC ENDOVASCULAR STENT GRAFT N/A 03/03/2021   Procedure: ABDOMINAL AORTIC ENDOVASCULAR STENT GRAFT;  Surgeon: Sheree Penne Bruckner, MD;  Location: [facility] OR;  Service: Vascular;  Laterality: N/A;   ABDOMINAL AORTOGRAM W/LOWER EXTREMITY N/A 04/10/2021   Procedure: ABDOMINAL AORTOGRAM W/LOWER EXTREMITY;  Surgeon: Sheree Penne Bruckner, MD;  Location: [facility] INVASIVE CV LAB;  Service: Cardiovascular;  Laterality: N/A;   BACK SURGERY     CARDIAC CATHETERIZATION     CATARACT EXTRACTION W/ INTRAOCULAR LENS  IMPLANT, BILATERAL Bilateral 01/2012 - 02/2012   CORONARY ATHERECTOMY N/A 04/27/2020   Procedure: CORONARY ATHERECTOMY;  Surgeon: Wonda Sharper, MD;  Location: [facility] INVASIVE CV LAB;  Service: Cardiovascular;  Laterality: N/A;   CORONARY IMAGING/OCT N/A 04/27/2020   Procedure: INTRAVASCULAR IMAGING/OCT;  Surgeon: Wonda Sharper, MD;  Location: [facility] INVASIVE CV LAB;  Service: Cardiovascular;  Laterality: N/A;   CORONARY PRESSURE/FFR STUDY N/A 04/27/2020   Procedure: INTRAVASCULAR PRESSURE WIRE/FFR STUDY;  Surgeon: Wonda Sharper, MD;  Location: [facility] INVASIVE CV LAB;  Service: Cardiovascular;  Laterality: N/A;   CORONARY STENT INTERVENTION N/A 04/27/2020   Procedure: CORONARY STENT INTERVENTION;  Surgeon: Wonda Sharper, MD;  Location: [facility] INVASIVE CV LAB;  Service: Cardiovascular;  Laterality: N/A;   DIRECT LARYNGOSCOPY Left 10/09/2017   Procedure: DIRECT LARYNGOSCOPY WITH BOPSY;  Surgeon: Arlana Arnt, MD;  Location: [facility];  Service: ENT;  Laterality: Left;   ESOPHAGOGASTRODUODENOSCOPY (EGD) WITH ESOPHAGEAL DILATION     couple times (07/19/2016)   ESOPHAGOSCOPY Left 10/09/2017   Procedure: ESOPHAGOSCOPY;  Surgeon: Arlana Arnt, MD;  Location: [facility];  Service: ENT;  Laterality: Left;   IR GASTROSTOMY TUBE MOD SED  01/08/2018   IR THORACENTESIS ASP PLEURAL SPACE W/IMG GUIDE  07/19/2016   LAPAROSCOPIC CHOLECYSTECTOMY  1984   LUMBAR DISC SURGERY   05/1996   L4-5; Dr. Alix ILES LAMINECTOMY/DECOMPRESSION MICRODISCECTOMY  10/2002   L3-4. Dr. Alix   MULTIPLE TOOTH EXTRACTIONS  1980s   PARTIAL GLOSSECTOMY  12/02/2017   Dr. [facility]alee- [facility]   pharyngoplasty for closure of tingue base defect  12/02/2017   Dr. [facility]alee- [facility]   PICC LINE INSERTION  07/15/2016   POSTERIOR LUMBAR FUSION  09/1996  Ray cage, L4-5 Dr. Mora   PROSTATE BIOPSY  ~ 2017   radical tonsillectomy Left 12/02/2017   Dr. [facility]alee at [facility]   RIGHT HEART CATH AND CORONARY ANGIOGRAPHY N/A 03/31/2020   Procedure: RIGHT HEART CATH AND CORONARY ANGIOGRAPHY;  Surgeon: Rolan Ezra RAMAN, MD;  Location: [facility] INVASIVE CV LAB;  Service: Cardiovascular;  Laterality: N/A;   RIGID BRONCHOSCOPY Left 10/09/2017   Procedure: RIGID BRONCHOSCOPY;  Surgeon: Arlana Arnt, MD;  Location: [facility];  Service: ENT;  Laterality: Left;   TEE WITHOUT CARDIOVERSION N/A 06/07/2020   Procedure: TRANSESOPHAGEAL ECHOCARDIOGRAM (TEE);  Surgeon: Wonda Sharper, MD;  Location: [facility] OR;  Service: Open Heart Surgery;  Laterality: N/A;   TONSILLECTOMY     TOTAL HIP ARTHROPLASTY Left 12/04/2020   Procedure: TOTAL HIP ARTHROPLASTY ANTERIOR APPROACH;  Surgeon: Fidel Rogue, MD;  Location: [facility];  Service: Orthopedics;  Laterality: Left;   TOTAL HIP ARTHROPLASTY Left 11/2020   TRACHEOSTOMY  12/02/2017   Dr. [facility]alee- [facility]   ULTRASOUND GUIDANCE FOR VASCULAR ACCESS Right 06/07/2020   Procedure: ULTRASOUND GUIDANCE FOR VASCULAR ACCESS;  Surgeon: Wonda Sharper, MD;  Location: [facility] OR;  Service: Open Heart Surgery;  Laterality: Right;   ULTRASOUND GUIDANCE FOR VASCULAR ACCESS Bilateral 03/03/2021   Procedure: ULTRASOUND GUIDANCE FOR VASCULAR ACCESS, BILATERAL FEMORAL ARTERIES;  Surgeon: Sheree Penne Bruckner, MD;  Location: [facility] OR;  Service: Vascular;  Laterality: Bilateral;   VASCULAR SURGERY     Past Medical History:  Diagnosis Date   Abdominal aneurysm    Arthritis    all  over (07/19/2016)   BPH (benign prostatic hypertrophy)    CAD (coronary artery disease)    Chicken pox    Chronic lower back pain    s/p surgical fusion   Depression    Diverticulosis    Esophageal stricture    GERD (gastroesophageal reflux disease)    Hepatitis B 1984   Hiatal hernia    History of radiation therapy 01/16/18- 03/05/18   Left Tonsil, 66 Gy in 33 fractions to high risk nodal echelons.    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 Wonda and Dr Lucas    Severe aortic stenosis    TIA (transient ischemic attack) 1990s   hx   tonsillar ca dx'd 11/2017   Tubular adenoma of colon 2009   There were no vitals taken for this visit.  Opioid Risk Score:   Fall Risk Score:  `1  Depression screen [facility] 2/9     09/11/2023   10:34 AM 07/12/2023   11:31 AM 06/21/2023    9:40 AM 05/15/2023   11:10 AM 01/08/2023    9:52 AM 09/24/2022   11:03 AM 07/16/2022   11:23 AM  Depression screen PHQ 2/9  Decreased Interest 0 0 0 0 0 0 2  Down, Depressed, Hopeless 0 0 0 0 0 0 1  PHQ - 2 Score 0 0 0 0 0 0 3  Altered sleeping  0 0      Tired, decreased energy  0 1      Change in appetite  0 3      Feeling bad or failure about yourself   0 0      Trouble concentrating  0 0      Moving slowly or fidgety/restless  0 0      Suicidal thoughts  0 0      PHQ-9 Score  0 4      Difficult doing work/chores  Not difficult at all Somewhat difficult        Review of Systems  Musculoskeletal:  Positive for back pain.       Lett shoulder pain, right leg pain down back to knee  All other systems reviewed and are negative.      Objective:   Physical Exam Vitals and nursing note reviewed.  Constitutional:      Appearance: Normal appearance.  Cardiovascular:     Rate and Rhythm: Normal rate and regular rhythm.      Pulses: Normal pulses.     Heart sounds: Normal heart sounds.  Pulmonary:     Effort: Pulmonary effort is normal.     Breath sounds: Normal breath sounds.  Musculoskeletal:     Comments: Normal Muscle Bulk and Muscle Testing Reveals:  Upper Extremities: Right: Full ROM and Muscle Strength 5/5 Left Upper Extremity: Decreased ROM 20 Degrees and Muscle Strength 5/5 Left AC Joint Tenderness  Lumbar Paraspinal Tenderness: L-4-L-5 Lower Extremities: Full ROM and Muscle Strength 5/5 Arises from Table slowly Narrow Based  Gait     Skin:    General: Skin is warm and dry.  Neurological:     Mental Status: He is alert and oriented to person, place, and time.  Psychiatric:        Mood and Affect: Mood normal.        Behavior: Behavior normal.          Assessment & Plan:  Lumbar Post-Laminectomy Syndrome: Lumbar Spondylosis Continue HEP as Tolerated. Continue to Monitor. 11/06/2023 Lumbar Radiculitis: Continue HEP as Tolerated. Continue to Monitor. Brandon [name] has stopped taking his gabapentin , he reports it was ineffective.  Continue to Monitor. 11/06/2023. Left Shoulder Pain: Brandon [name] is scheduled for: 12/06/2023: Dr Kay: ARTHROPLASTY, SHOULDER, TOTAL, REVERSE  Chronic Pain Syndrome: Continue MS Contin  15 mg every 12 hours #60,  Refilled: Oxycodone  10 mg one tablet every 4 hours #150. Second script sent to accommodate scheduled appointment. We will continue the opioid monitoring program, this consists of regular clinic visits, examinations, urine drug screen, pill counts as well as use of [place]  Controlled Substance Reporting system. A 12 month History has been reviewed on the [place]  Controlled Substance Reporting System 11/06/2023   4. Restless Leg Syndrome: Continue Gabapentin .  Continue current medication regimen. Continue to Monitor. 11/06/2023   F/U in 2 months

## 2023-11-11 ENCOUNTER — Ambulatory Visit: Admitting: Nurse Practitioner

## 2023-11-11 ENCOUNTER — Encounter: Payer: Self-pay | Admitting: Nurse Practitioner

## 2023-11-11 VITALS — BP 112/64 | HR 60 | Temp 98.5°F | Ht 68.0 in | Wt 142.1 lb

## 2023-11-11 DIAGNOSIS — J441 Chronic obstructive pulmonary disease with (acute) exacerbation: Secondary | ICD-10-CM | POA: Diagnosis not present

## 2023-11-11 DIAGNOSIS — R051 Acute cough: Secondary | ICD-10-CM | POA: Diagnosis not present

## 2023-11-11 LAB — DRUG TOX MONITOR 1 W/CONF, ORAL FLD
Amphetamines: NEGATIVE ng/mL (ref <10)
Barbiturates: NEGATIVE ng/mL (ref <10)
Benzodiazepines: NEGATIVE ng/mL (ref <0.50)
Buprenorphine: NEGATIVE ng/mL (ref <0.10)
Cocaine: NEGATIVE ng/mL (ref <5.0)
Codeine: NEGATIVE ng/mL (ref <2.5)
Cotinine: >250 ng/mL — ABNORMAL HIGH (ref <5.0)
Dihydrocodeine: NEGATIVE ng/mL (ref <2.5)
Fentanyl: NEGATIVE ng/mL (ref <0.10)
Heroin Metabolite: NEGATIVE ng/mL (ref <1.0)
Hydrocodone: NEGATIVE ng/mL (ref <2.5)
Hydromorphone: NEGATIVE ng/mL (ref <2.5)
MARIJUANA: NEGATIVE ng/mL (ref <2.5)
MDMA: NEGATIVE ng/mL (ref <10)
Meprobamate: NEGATIVE ng/mL (ref <2.5)
Methadone: NEGATIVE ng/mL (ref <5.0)
Morphine: 3.1 ng/mL — ABNORMAL HIGH (ref <2.5)
Nicotine Metabolite: POSITIVE ng/mL — AB (ref <5.0)
Norhydrocodone: NEGATIVE ng/mL (ref <2.5)
Noroxycodone: 103.9 ng/mL — ABNORMAL HIGH (ref <2.5)
Opiates: POSITIVE ng/mL — AB (ref <2.5)
Oxycodone: >250 ng/mL — ABNORMAL HIGH (ref <2.5)
Oxymorphone: NEGATIVE ng/mL (ref <2.5)
Phencyclidine: NEGATIVE ng/mL (ref <10)
Tapentadol: NEGATIVE ng/mL (ref <5.0)
Tramadol: NEGATIVE ng/mL (ref <5.0)
Zolpidem: NEGATIVE ng/mL (ref <5.0)

## 2023-11-11 LAB — DRUG TOX ALC METAB W/CON, ORAL FLD: Alcohol Metabolite: NEGATIVE ng/mL (ref <25)

## 2023-11-11 LAB — POC COVID19 BINAXNOW: SARS Coronavirus 2 Ag: NEGATIVE

## 2023-11-11 LAB — POCT INFLUENZA A/B
Influenza A, POC: NEGATIVE
Influenza B, POC: NEGATIVE

## 2023-11-11 LAB — BRAIN NATRIURETIC PEPTIDE: Pro B Natriuretic peptide (BNP): 177 pg/mL — ABNORMAL HIGH (ref 0.0–100.0)

## 2023-11-11 MED ORDER — DOXYCYCLINE HYCLATE 100 MG PO TABS: 100.0000 mg | ORAL_TABLET | Freq: Two times a day (BID) | ORAL | 0 refills | Status: AC

## 2023-11-11 MED ORDER — SPIRIVA RESPIMAT 2.5 MCG/ACT IN AERS: 2.0000 | INHALATION_SPRAY | Freq: Every day | RESPIRATORY_TRACT | 6 refills | Status: DC

## 2023-11-11 MED ORDER — ALBUTEROL SULFATE HFA 108 (90 BASE) MCG/ACT IN AERS: 2.0000 | INHALATION_SPRAY | Freq: Four times a day (QID) | RESPIRATORY_TRACT | 2 refills | Status: DC | PRN

## 2023-11-11 MED ORDER — BENZONATATE 100 MG PO CAPS: 100.0000 mg | ORAL_CAPSULE | Freq: Three times a day (TID) | ORAL | 0 refills | Status: DC | PRN

## 2023-11-11 NOTE — Progress Notes (Signed)
 Acute Office Visit  Subjective:    Patient ID: Brandon [name], male    DOB: 01/14/1941, 83 y.o.   MRN: [id number]  Chief Complaint  Patient presents with   Nasal Congestion    Pt complains of nasal congestion. Pt states radiation messed his sinuses up. Pt complains of nasal and upper congestion x 4 days. Pt states OTC Mucinex  is not helping. White mucus. No NV/fever/chills.    HPI Chronic obstructive pulmonary disease (HCC) Non productive cough and sinus congestion x 4days, SOB with exertion, no PND, no fever, no CP. No improvement with mucinex . Current use of oral predisone 5mg  daily No current tobacco use or vaping. He reports CXR was completed last week by ortho for pre op clearance. CXR was normal per patient. CXR report not available via Epic  Check BNP today Start doxycycline  and benzonatate Sent refill on spiriva  and albuterol .   Wt Readings from Last 3 Encounters:  11/11/23 142 lb 1.6 oz (64.5 kg)  11/06/23 140 lb (63.5 kg)  10/08/23 136 lb 6.4 oz (61.9 kg)    Outpatient Medications Prior to Visit  Medication Sig   acetaminophen  (TYLENOL ) 500 MG tablet Take 1,000 mg by mouth every 8 (eight) hours as needed for mild pain or headache.   allopurinol  (ZYLOPRIM ) 100 MG tablet Take 2 tablets (200 mg total) by mouth daily.   amLODipine  (NORVASC ) 10 MG tablet Take 1 tablet (10 mg total) by mouth daily.   aspirin  EC 81 MG tablet Take 81 mg by mouth at bedtime. Swallow whole.   atorvastatin  (LIPITOR ) 80 MG tablet Take 1 tablet (80 mg total) by mouth daily.   carvedilol  (COREG ) 12.5 MG tablet Take 1 tablet (12.5 mg total) by mouth 2 (two) times daily.   citalopram  (CELEXA ) 20 MG tablet 20 mg by oral route.   cyanocobalamin  (VITAMIN B12) 1000 MCG tablet Take 1 tablet (1,000 mcg total) by mouth daily.   dorzolamide -timolol  (COSOPT ) 2-0.5 % ophthalmic solution Place 1 drop into the left eye 2 (two) times daily.   ezetimibe  (ZETIA ) 10 MG tablet TAKE 1 TABLET ONCE DAILY   feeding  supplement (BOOST HIGH PROTEIN) LIQD Take 1 Container by mouth 3 (three) times daily between meals.   ferrous sulfate  325 (65 FE) MG EC tablet Take 1 tablet (325 mg total) by mouth daily with breakfast.   FLUoxetine  (PROZAC ) 20 MG capsule Take 1 capsule (20 mg total) by mouth daily.   fluticasone  (FLONASE ) 50 MCG/ACT nasal spray SHAKE LIQUID AND USE 2 SPRAYS IN EACH NOSTRIL DAILY AS NEEDED FOR RHINITIS OR ALLERGIES   furosemide  (LASIX ) 20 MG tablet Take 1 tablet (20 mg total) by mouth daily as needed for fluid or edema.   gabapentin  (NEURONTIN ) 300 MG capsule TAKE 1 CAPSULE(300 MG) BY MOUTH TWICE DAILY   guaiFENesin  (MUCINEX ) 600 MG 12 hr tablet Take 600 mg by mouth 2 (two) times daily as needed for cough or to loosen phlegm.   hydrocortisone  cream 0.5 % Apply 1 Application topically as needed.   hydrOXYzine  (ATARAX ) 10 MG tablet Take 1-2 tablets (10-20 mg total) by mouth at bedtime as needed for itching (or sleep).   ipratropium (ATROVENT ) 0.02 % nebulizer solution Use 1 vial (0.5 mg total) by nebulization every 6 (six) hours as needed for wheezing or shortness of breath.   mirtazapine  (REMERON ) 7.5 MG tablet Take 1 tablet (7.5 mg total) by mouth at bedtime.   morphine  (MS CONTIN ) 15 MG 12 hr tablet Take 1 tablet (15 mg  total) by mouth every 12 (twelve) hours.   morphine  (MS CONTIN ) 15 MG 12 hr tablet Take 1 tablet (15 mg total) by mouth every 12 (twelve) hours. Fill today- 8/6   naloxone  (NARCAN ) nasal spray 4 mg/0.1 mL For possible accidentally overdose.   Oxycodone  HCl 10 MG TABS Take 1 tablet (10 mg total) by mouth every 4 (four) hours as needed.   Oxycodone  HCl 10 MG TABS Take 1 tablet (10 mg total) by mouth every 4 (four) hours as needed. 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   potassium chloride  (KLOR-CON ) 10 MEQ tablet TAKE 1 TABLET(10 MEQ) BY MOUTH FOUR TIMES DAILY   predniSONE  (DELTASONE ) 1 MG  tablet Take 3 tablets (3 mg total) by mouth daily with breakfast. (Along with a 5mg  tablet)   predniSONE  (DELTASONE ) 5 MG tablet TAKE 1 TABLET(5 MG) BY MOUTH DAILY WITH BREAKFAST   tamsulosin  (FLOMAX ) 0.4 MG CAPS capsule Take 1 capsule (0.4 mg total) by mouth daily.   traMADol  (ULTRAM ) 50 MG tablet    traZODone  (DESYREL ) 50 MG tablet TAKE 1/2 TO 1 TABLET(25 TO 50 MG) BY MOUTH AT BEDTIME AS NEEDED FOR SLEEP   triamcinolone  cream (KENALOG ) 0.1 % Apply 1 Application topically 2 (two) times daily as needed (to itchy sites).   trolamine salicylate (ASPERCREME) 10 % cream Apply 1 Application topically 2 (two) times daily as needed (arthritis pain).   zoledronic  acid (RECLAST ) 5 MG/100ML SOLN injection Inject 5 mg into the vein once. 09/11/2022 and 10/08/2023   [DISCONTINUED] albuterol  (VENTOLIN  HFA) 108 (90 Base) MCG/ACT inhaler Inhale 2 puffs into the lungs every 6 (six) hours as needed for wheezing or shortness of breath.   [DISCONTINUED] Tiotropium Bromide  Monohydrate (SPIRIVA  RESPIMAT) 2.5 MCG/ACT AERS Inhale 2 puffs into the lungs daily.   No facility-administered medications prior to visit.    Reviewed past medical and social history.  Review of Systems Per HPI     Objective:    Physical Exam Vitals and nursing note reviewed.  Constitutional:      General: He is not in acute distress. Cardiovascular:     Rate and Rhythm: Normal rate and regular rhythm.     Pulses: Normal pulses.     Heart sounds: Normal heart sounds.  Pulmonary:     Effort: Pulmonary effort is normal. No respiratory distress.     Breath sounds: No stridor. Rhonchi present. No wheezing.     Comments: Rhonchi clears with cough Musculoskeletal:     Right lower leg: No edema.     Left lower leg: No edema.  Neurological:     Mental Status: He is alert and oriented to person, place, and time.    BP 112/64 (BP Location: Right Arm, Patient Position: Sitting, Cuff Size: Normal)   Pulse 60   Temp 98.5 F (36.9 C)  (Temporal)   Ht 5' 8 (1.727 m)   Wt 142 lb 1.6 oz (64.5 kg)   SpO2 95%   BMI 21.61 kg/m    Results for orders placed or performed in visit on 11/11/23  POC COVID-19 BinaxNow  Result Value Ref Range   SARS Coronavirus 2 Ag Negative Negative  POCT Influenza A/B  Result Value Ref Range   Influenza A, POC Negative Negative   Influenza B, POC Negative Negative      Assessment & Plan:   Problem List Items Addressed This Visit  Chronic obstructive pulmonary disease (HCC) - Primary   Non productive cough and sinus congestion x 4days, SOB with exertion, no PND, no fever, no CP. No improvement with mucinex . Current use of oral predisone 5mg  daily No current tobacco use or vaping. He reports CXR was completed last week by ortho for pre op clearance. CXR was normal per patient. CXR report not available via Epic  Check BNP today Start doxycycline  and benzonatate Sent refill on spiriva  and albuterol .      Relevant Medications   doxycycline  (VIBRA -TABS) 100 MG tablet   benzonatate (TESSALON) 100 MG capsule   albuterol  (VENTOLIN  HFA) 108 (90 Base) MCG/ACT inhaler   Tiotropium Bromide  Monohydrate (SPIRIVA  RESPIMAT) 2.5 MCG/ACT AERS   Other Visit Diagnoses       Acute cough       Relevant Medications   benzonatate (TESSALON) 100 MG capsule   Other Relevant Orders   B Nat Peptide   POC COVID-19 BinaxNow (Completed)   POCT Influenza A/B (Completed)      Meds ordered this encounter  Medications   doxycycline  (VIBRA -TABS) 100 MG tablet    Sig: Take 1 tablet (100 mg total) by mouth 2 (two) times daily for 7 days.    Dispense:  14 tablet    Refill:  0    Supervising Provider:   BERNETA FALLOW ALFRED [[id number]]   benzonatate (TESSALON) 100 MG capsule    Sig: Take 1 capsule (100 mg total) by mouth 3 (three) times daily as needed.    Dispense:  30 capsule    Refill:  0    Supervising Provider:   BERNETA FALLOW ALFRED [[id number]]   albuterol  (VENTOLIN  HFA) 108 (90 Base) MCG/ACT  inhaler    Sig: Inhale 2 puffs into the lungs every 6 (six) hours as needed for wheezing or shortness of breath.    Dispense:  8 g    Refill:  2    Supervising Provider:   BERNETA FALLOW ALFRED [[id number]]   Tiotropium Bromide  Monohydrate (SPIRIVA  RESPIMAT) 2.5 MCG/ACT AERS    Sig: Inhale 2 puffs into the lungs daily.    Dispense:  4 g    Refill:  6    Supervising Provider:   BERNETA FALLOW SAYRE [[id number]]   Return if symptoms worsen or fail to improve.    Roselie Mood, NP

## 2023-11-11 NOTE — Assessment & Plan Note (Signed)
 Non productive cough and sinus congestion x 4days, SOB with exertion, no PND, no fever, no CP. No improvement with mucinex . Current use of oral predisone 5mg  daily No current tobacco use or vaping. He reports CXR was completed last week by ortho for pre op clearance. CXR was normal per patient. CXR report not available via Epic  Check BNP today Start doxycycline  and benzonatate Sent refill on spiriva  and albuterol .

## 2023-11-11 NOTE — Patient Instructions (Addendum)
 Go to lab Start doxycycline  continue albuterol  and spiriva  Schedule another appointment if no improvement in 1week

## 2023-11-12 ENCOUNTER — Ambulatory Visit: Payer: Self-pay | Admitting: Nurse Practitioner

## 2023-11-12 DIAGNOSIS — R7989 Other specified abnormal findings of blood chemistry: Secondary | ICD-10-CM

## 2023-11-17 ENCOUNTER — Other Ambulatory Visit: Payer: Self-pay | Admitting: Family Medicine

## 2023-11-17 DIAGNOSIS — R634 Abnormal weight loss: Secondary | ICD-10-CM

## 2023-11-19 DIAGNOSIS — C108 Malignant neoplasm of overlapping sites of oropharynx: Secondary | ICD-10-CM | POA: Diagnosis not present

## 2023-11-20 NOTE — H&P (Addendum)
 Patient's anticipated LOS is less than 2 midnights, meeting these requirements:  - Lives within 1 hour of care - Has a competent adult at home to recover with post-op recover      Brandon [name] is an 83 y.o. male.    Chief Complaint: left shoulder pain  HPI: Pt is a 83 y.o. male complaining of left shoulder pain for multiple years. Pain had continually increased since the beginning. X-rays in the clinic show end-stage arthritic changes of the left shoulder. Pt has tried various conservative treatments which have failed to alleviate their symptoms, including injections and therapy. Various options are discussed with the patient. Risks, benefits and expectations were discussed with the patient. Patient understand the risks, benefits and expectations and wishes to proceed with surgery.   PCP:  Thedora Garnette HERO, MD  D/C Plans: Home  PMH: Past Medical History:  Diagnosis Date   Abdominal aneurysm    Arthritis    all over (07/19/2016)   BPH (benign prostatic hypertrophy)    CAD (coronary artery disease)    Chicken pox    Chronic lower back pain    s/p surgical fusion   Depression    Diverticulosis    Esophageal stricture    GERD (gastroesophageal reflux disease)    Hepatitis B 1984   Hiatal hernia    History of radiation therapy 01/16/18- 03/05/18   Left Tonsil, 66 Gy in 33 fractions to high risk nodal echelons.    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 Wonda and Dr Lucas    Severe aortic stenosis    TIA (transient ischemic attack) 1990s   hx   tonsillar ca dx'd 11/2017   Tubular adenoma of colon 2009    PSH: Past Surgical History:  Procedure Laterality Date   ABDOMINAL AORTIC ENDOVASCULAR STENT GRAFT N/A 03/03/2021   Procedure: ABDOMINAL AORTIC ENDOVASCULAR STENT GRAFT;  Surgeon:  Sheree Penne Bruckner, MD;  Location: [facility] OR;  Service: Vascular;  Laterality: N/A;   ABDOMINAL AORTOGRAM W/LOWER EXTREMITY N/A 04/10/2021   Procedure: ABDOMINAL AORTOGRAM W/LOWER EXTREMITY;  Surgeon: Sheree Penne Bruckner, MD;  Location: [facility] INVASIVE CV LAB;  Service: Cardiovascular;  Laterality: N/A;   BACK SURGERY     CARDIAC CATHETERIZATION     CATARACT EXTRACTION W/ INTRAOCULAR LENS  IMPLANT, BILATERAL Bilateral 01/2012 - 02/2012   CORONARY ATHERECTOMY N/A 04/27/2020   Procedure: CORONARY ATHERECTOMY;  Surgeon: Wonda Sharper, MD;  Location: [facility] INVASIVE CV LAB;  Service: Cardiovascular;  Laterality: N/A;   CORONARY IMAGING/OCT N/A 04/27/2020   Procedure: INTRAVASCULAR IMAGING/OCT;  Surgeon: Wonda Sharper, MD;  Location: [facility] INVASIVE CV LAB;  Service: Cardiovascular;  Laterality: N/A;   CORONARY PRESSURE/FFR STUDY N/A 04/27/2020   Procedure: INTRAVASCULAR PRESSURE WIRE/FFR STUDY;  Surgeon: Wonda Sharper, MD;  Location: [facility] INVASIVE CV LAB;  Service: Cardiovascular;  Laterality: N/A;   CORONARY STENT INTERVENTION N/A 04/27/2020   Procedure: CORONARY STENT INTERVENTION;  Surgeon: Wonda Sharper, MD;  Location: [facility] INVASIVE CV LAB;  Service: Cardiovascular;  Laterality: N/A;   DIRECT LARYNGOSCOPY Left 10/09/2017   Procedure: DIRECT LARYNGOSCOPY WITH BOPSY;  Surgeon: Arlana Arnt, MD;  Location: [facility];  Service: ENT;  Laterality: Left;   ESOPHAGOGASTRODUODENOSCOPY (EGD) WITH ESOPHAGEAL DILATION     couple times (07/19/2016)   ESOPHAGOSCOPY Left 10/09/2017  Procedure: ESOPHAGOSCOPY;  Surgeon: Arlana Arnt, MD;  Location: [facility];  Service: ENT;  Laterality: Left;   IR GASTROSTOMY TUBE MOD SED  01/08/2018   IR THORACENTESIS ASP PLEURAL SPACE W/IMG GUIDE  07/19/2016   LAPAROSCOPIC CHOLECYSTECTOMY  1984   LUMBAR DISC SURGERY  05/1996   L4-5; Dr. Alix ILES LAMINECTOMY/DECOMPRESSION MICRODISCECTOMY  10/2002   L3-4. Dr. Alix    MULTIPLE TOOTH EXTRACTIONS  1980s   PARTIAL GLOSSECTOMY  12/02/2017   Dr. Lauralee- [facility]   pharyngoplasty for closure of tingue base defect  12/02/2017   Dr. Lauralee- [facility]   PICC LINE INSERTION  07/15/2016   POSTERIOR LUMBAR FUSION  09/1996   Ray cage, L4-5 Dr. Mora   PROSTATE BIOPSY  ~ 2017   radical tonsillectomy Left 12/02/2017   Dr. [facility]alee at [facility]   RIGHT HEART CATH AND CORONARY ANGIOGRAPHY N/A 03/31/2020   Procedure: RIGHT HEART CATH AND CORONARY ANGIOGRAPHY;  Surgeon: Rolan Ezra RAMAN, MD;  Location: [facility] INVASIVE CV LAB;  Service: Cardiovascular;  Laterality: N/A;   RIGID BRONCHOSCOPY Left 10/09/2017   Procedure: RIGID BRONCHOSCOPY;  Surgeon: Arlana Arnt, MD;  Location: [facility];  Service: ENT;  Laterality: Left;   TEE WITHOUT CARDIOVERSION N/A 06/07/2020   Procedure: TRANSESOPHAGEAL ECHOCARDIOGRAM (TEE);  Surgeon: Wonda Sharper, MD;  Location: [facility] OR;  Service: Open Heart Surgery;  Laterality: N/A;   TONSILLECTOMY     TOTAL HIP ARTHROPLASTY Left 12/04/2020   Procedure: TOTAL HIP ARTHROPLASTY ANTERIOR APPROACH;  Surgeon: Fidel Rogue, MD;  Location: [facility] ORS;  Service: Orthopedics;  Laterality: Left;   TOTAL HIP ARTHROPLASTY Left 11/2020   TRACHEOSTOMY  12/02/2017   Dr. [facility]alee- [facility]   ULTRASOUND GUIDANCE FOR VASCULAR ACCESS Right 06/07/2020   Procedure: ULTRASOUND GUIDANCE FOR VASCULAR ACCESS;  Surgeon: Wonda Sharper, MD;  Location: [facility] OR;  Service: Open Heart Surgery;  Laterality: Right;   ULTRASOUND GUIDANCE FOR VASCULAR ACCESS Bilateral 03/03/2021   Procedure: ULTRASOUND GUIDANCE FOR VASCULAR ACCESS, BILATERAL FEMORAL ARTERIES;  Surgeon: Sheree Penne Bruckner, MD;  Location: [facility] OR;  Service: Vascular;  Laterality: Bilateral;   VASCULAR SURGERY      Social History:  reports that he quit smoking about 6 years ago. His smoking use included cigarettes. He started smoking about 56 years ago. He has a 75 pack-year smoking history. He has never been  exposed to tobacco smoke. He has never used smokeless tobacco. He reports current alcohol  use. He reports current drug use. Drugs: Oxycodone  and Morphine . BMI: Estimated body mass index is 21.61 kg/m as calculated from the following:   Height as of 11/11/23: 5' 8 (1.727 m).   Weight as of 11/11/23: 64.5 kg.  Lab Results  Component Value Date   ALBUMIN  3.2 (L) 06/12/2023   Diabetes: Patient does not have a diagnosis of diabetes. Lab Results  Component Value Date   HGBA1C 6.1 (H) 09/25/2023     Smoking Status:      Allergies:  Allergies  Allergen Reactions   Tape Other (See Comments)    Tears skin. Prefers paper tape, Please   Celebrex [Celecoxib] Hives and Itching    Medications: No current facility-administered medications for this encounter.   Current Outpatient Medications  Medication Sig Dispense Refill   acetaminophen  (TYLENOL ) 500 MG tablet Take 1,000 mg by mouth every 8 (eight) hours as needed for mild pain or headache.     albuterol  (VENTOLIN  HFA) 108 (90 Base) MCG/ACT inhaler Inhale 2 puffs into the lungs every  6 (six) hours as needed for wheezing or shortness of breath. 8 g 2   allopurinol  (ZYLOPRIM ) 100 MG tablet Take 2 tablets (200 mg total) by mouth daily. 180 tablet 1   amLODipine  (NORVASC ) 10 MG tablet Take 1 tablet (10 mg total) by mouth daily. 90 tablet 3   aspirin  EC 81 MG tablet Take 81 mg by mouth at bedtime. Swallow whole.     atorvastatin  (LIPITOR ) 80 MG tablet Take 1 tablet (80 mg total) by mouth daily. 90 tablet 3   benzonatate (TESSALON) 100 MG capsule Take 1 capsule (100 mg total) by mouth 3 (three) times daily as needed. 30 capsule 0   carvedilol  (COREG ) 12.5 MG tablet Take 1 tablet (12.5 mg total) by mouth 2 (two) times daily. 180 tablet 3   citalopram  (CELEXA ) 20 MG tablet 20 mg by oral route.     cyanocobalamin  (VITAMIN B12) 1000 MCG tablet Take 1 tablet (1,000 mcg total) by mouth daily. 90 tablet 3   dorzolamide -timolol  (COSOPT ) 2-0.5 %  ophthalmic solution Place 1 drop into the left eye 2 (two) times daily. 1 mL 12   ezetimibe  (ZETIA ) 10 MG tablet TAKE 1 TABLET ONCE DAILY 90 tablet 3   feeding supplement (BOOST HIGH PROTEIN) LIQD Take 1 Container by mouth 3 (three) times daily between meals.     ferrous sulfate  325 (65 FE) MG EC tablet Take 1 tablet (325 mg total) by mouth daily with breakfast. 30 tablet 0   FLUoxetine  (PROZAC ) 20 MG capsule Take 1 capsule (20 mg total) by mouth daily. 90 capsule 3   fluticasone  (FLONASE ) 50 MCG/ACT nasal spray SHAKE LIQUID AND USE 2 SPRAYS IN EACH NOSTRIL DAILY AS NEEDED FOR RHINITIS OR ALLERGIES 16 g 11   furosemide  (LASIX ) 20 MG tablet Take 1 tablet (20 mg total) by mouth daily as needed for fluid or edema. 30 tablet 3   gabapentin  (NEURONTIN ) 300 MG capsule TAKE 1 CAPSULE(300 MG) BY MOUTH TWICE DAILY 60 capsule 5   guaiFENesin  (MUCINEX ) 600 MG 12 hr tablet Take 600 mg by mouth 2 (two) times daily as needed for cough or to loosen phlegm.     hydrocortisone  cream 0.5 % Apply 1 Application topically as needed.     hydrOXYzine  (ATARAX ) 10 MG tablet Take 1-2 tablets (10-20 mg total) by mouth at bedtime as needed for itching (or sleep). 30 tablet 3   ipratropium (ATROVENT ) 0.02 % nebulizer solution Use 1 vial (0.5 mg total) by nebulization every 6 (six) hours as needed for wheezing or shortness of breath. 75 mL 12   mirtazapine  (REMERON ) 7.5 MG tablet TAKE 1 TABLET(7.5 MG) BY MOUTH AT BEDTIME 30 tablet 3   morphine  (MS CONTIN ) 15 MG 12 hr tablet Take 1 tablet (15 mg total) by mouth every 12 (twelve) hours. 60 tablet 0   morphine  (MS CONTIN ) 15 MG 12 hr tablet Take 1 tablet (15 mg total) by mouth every 12 (twelve) hours. Fill today- 8/6 60 tablet 0   naloxone  (NARCAN ) nasal spray 4 mg/0.1 mL For possible accidentally overdose. 2 each 5   Oxycodone  HCl 10 MG TABS Take 1 tablet (10 mg total) by mouth every 4 (four) hours as needed. 150 tablet 0   Oxycodone  HCl 10 MG TABS Take 1 tablet (10 mg total) by  mouth every 4 (four) hours as needed. 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 90 tablet 3   potassium chloride  (KLOR-CON ) 10 MEQ tablet TAKE 1 TABLET(10 MEQ) BY MOUTH FOUR TIMES DAILY 120 tablet 3   predniSONE  (DELTASONE ) 1 MG tablet Take 3 tablets (3 mg total) by mouth daily with breakfast. (Along with a 5mg  tablet) 270 tablet 0   predniSONE  (DELTASONE ) 5 MG tablet TAKE 1 TABLET(5 MG) BY MOUTH DAILY WITH BREAKFAST 90 tablet 0   tamsulosin  (FLOMAX ) 0.4 MG CAPS capsule Take 1 capsule (0.4 mg total) by mouth daily. 90 capsule 3   Tiotropium Bromide  Monohydrate (SPIRIVA  RESPIMAT) 2.5 MCG/ACT AERS Inhale 2 puffs into the lungs daily. 4 g 6   traMADol  (ULTRAM ) 50 MG tablet      traZODone  (DESYREL ) 50 MG tablet TAKE 1/2 TO 1 TABLET(25 TO 50 MG) BY MOUTH AT BEDTIME AS NEEDED FOR SLEEP 30 tablet 3   triamcinolone  cream (KENALOG ) 0.1 % Apply 1 Application topically 2 (two) times daily as needed (to itchy sites).     trolamine salicylate (ASPERCREME) 10 % cream Apply 1 Application topically 2 (two) times daily as needed (arthritis pain).     zoledronic  acid (RECLAST ) 5 MG/100ML SOLN injection Inject 5 mg into the vein once. 09/11/2022 and 10/08/2023      No results found. However, due to the size of the patient record, not all encounters were searched. Please check Results Review for a complete set of results. No results found.  ROS: Pain with rom of the left upper extremity  Physical Exam: Alert and oriented 83 y.o. male in no acute distress Cranial nerves 2-12 intact Cervical spine: full rom with no tenderness, nv intact distally Chest: active breath sounds bilaterally, no wheeze rhonchi or rales Heart: regular rate and rhythm, no murmur Abd: non tender non distended with active bowel sounds Hip is stable with rom  Left shoulder painful and weak rom Nv intact distally No rashes or edema  distally  Assessment/Plan Assessment: left shoulder cuff arthropathy  Plan:  Patient will undergo a left reverse total shoulder by Dr. Kay at [place] Risks benefits and expectations were discussed with the patient. Patient understand risks, benefits and expectations and wishes to proceed. Preoperative templating of the joint replacement has been completed, documented, and submitted to the Operating Room personnel in order to optimize intra-operative equipment management.   Arvella Fireman PA-C, MPAS [facility] is now [employer] [address], [address], [place], [place] [address] Phone: [phone] [email or web address] Facebook  [employer]

## 2023-11-25 NOTE — Progress Notes (Signed)
 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

## 2023-11-25 NOTE — Patient Instructions (Signed)
 SURGICAL WAITING ROOM VISITATION  Patients having surgery or a procedure may have no more than 2 support people in the waiting area - these visitors may rotate.    Children under the age of 36 must have an adult with them who is not the patient.  Visitors with respiratory illnesses are discouraged from visiting and should remain at home.  If the patient needs to stay at the hospital during part of their recovery, the visitor guidelines for inpatient rooms apply. Pre-op nurse will coordinate an appropriate time for 1 support person to accompany patient in pre-op.  This support person may not rotate.    Please refer to the [facility] website for the visitor guidelines for Inpatients (after your surgery is over and you are in a regular room).       Your procedure is scheduled on:  12/06/2023    Report to [facility]tr[facility]    Report to admitting at   1045AM   Call this number if you have problems the morning of surgery [phone]   Do not eat food :After Midnight.   After Midnight you may have the following liquids until _ 1015_____ AM  DAY OF SURGERY  Water  Non-Citrus Juices (without pulp, NO RED-Apple, White grape, White cranberry) Black Coffee (NO MILK/CREAM OR CREAMERS, sugar ok)  Clear Tea (NO MILK/CREAM OR CREAMERS, sugar ok) regular and decaf                             Plain Jell-O (NO RED)                                           Fruit ices (not with fruit pulp, NO RED)                                     Popsicles (NO RED)                                                               Sports drinks like Gatorade (NO RED)                    The day of surgery:  Drink ONE (1) Pre-Surgery Clear Ensure or G2 at  1015AM the morning of surgery. Drink in one sitting. Do not sip.  This drink was given to you during your hospital  pre-op appointment visit. Nothing else to drink after completing the  Pre-Surgery Clear Ensure or G2.          If you have  questions, please contact your surgeon's office.       Oral Hygiene is also important to reduce your risk of infection.                                    Remember - BRUSH YOUR TEETH THE MORNING OF SURGERY WITH YOUR REGULAR TOOTHPASTE  DENTURES WILL BE REMOVED PRIOR TO SURGERY PLEASE DO NOT APPLY Poly grip OR  ADHESIVES!!!   Do NOT smoke after Midnight   Stop all vitamins and herbal supplements 7 days before surgery.   Take these medicines the morning of surgery with A SIP OF WATER :  inihalers as usual and bring, allopurinol , amlodipine , coreg , celexa , eye drops as usual prozac , MS Contin , lebulizer if needed, gabapentin , flonase  if needed, prtonix, flomax    DO NOT TAKE ANY ORAL DIABETIC MEDICATIONS DAY OF YOUR SURGERY  Bring CPAP mask and tubing day of surgery.                              You may not have any metal on your body including hair pins, jewelry, and body piercing             Do not wear make-up, lotions, powders, perfumes/cologne, or deodorant  Do not wear nail polish including gel and S&S, artificial/acrylic nails, or any other type of covering on natural nails including finger and toenails. If you have artificial nails, gel coating, etc. that needs to be removed by a nail salon please have this removed prior to surgery or surgery may need to be canceled/ delayed if the surgeon/ anesthesia feels like they are unable to be safely monitored.   Do not shave  48 hours prior to surgery.               Men may shave face and neck.   Do not bring valuables to the hospital. [facility] IS NOT             RESPONSIBLE   FOR VALUABLES.   Contacts, glasses, dentures or bridgework may not be worn into surgery.   Bring small overnight bag day of surgery.   DO NOT BRING YOUR HOME MEDICATIONS TO THE HOSPITAL. PHARMACY WILL DISPENSE MEDICATIONS LISTED ON YOUR MEDICATION LIST TO YOU DURING YOUR ADMISSION IN THE HOSPITAL!    Patients discharged on the day of surgery will not be  allowed to drive home.  Someone NEEDS to stay with you for the first 24 hours after anesthesia.   Special Instructions: Bring a copy of your healthcare power of attorney and living will documents the day of surgery if you haven't scanned them before.              Please read over the following fact sheets you were given: IF YOU HAVE QUESTIONS ABOUT YOUR PRE-OP INSTRUCTIONS PLEASE CALL [phone].   If you received a COVID test during your pre-op visit  it is requested that you wear a mask when out in public, stay away from anyone that may not be feeling well and notify your surgeon if you develop symptoms. If you test positive for Covid or have been in contact with anyone that has tested positive in the last 10 days please notify you surgeon.      Pre-operative 4 CHG Bath Instructions   You can play a key role in reducing the risk of infection after surgery. Your skin needs to be as free of germs as possible. You can reduce the number of germs on your skin by washing with CHG (chlorhexidine  gluconate) soap before surgery. CHG is an antiseptic soap that kills germs and continues to kill germs even after washing.   DO NOT use if you have an allergy to chlorhexidine /CHG or antibacterial soaps. If your skin becomes reddened or irritated, stop using the CHG and notify one of our RNs at [phone].   Please  shower with the CHG soap starting 4 days before surgery using the following schedule:     Please keep in mind the following:  DO NOT shave, including legs and underarms, starting the day of your first shower.   You may shave your face at any point before/day of surgery.  Place clean sheets on your bed the day you start using CHG soap. Use a clean washcloth (not used since being washed) for each shower. DO NOT sleep with pets once you start using the CHG.   CHG Shower Instructions:  If you choose to wash your hair and private area, wash first with your normal shampoo/soap.  After you use  shampoo/soap, rinse your hair and body thoroughly to remove shampoo/soap residue.  Turn the water  OFF and apply about 3 tablespoons (45 ml) of CHG soap to a CLEAN washcloth.  Apply CHG soap ONLY FROM YOUR NECK DOWN TO YOUR TOES (washing for 3-5 minutes)  DO NOT use CHG soap on face, private areas, open wounds, or sores.  Pay special attention to the area where your surgery is being performed.  If you are having back surgery, having someone wash your back for you may be helpful. Wait 2 minutes after CHG soap is applied, then you may rinse off the CHG soap.  Pat dry with a clean towel  Put on clean clothes/pajamas   If you choose to wear lotion, please use ONLY the CHG-compatible lotions on the back of this paper.     Additional instructions for the day of surgery: DO NOT APPLY any lotions, deodorants, cologne, or perfumes.   Put on clean/comfortable clothes.  Brush your teeth.  Ask your nurse before applying any prescription medications to the skin.      CHG Compatible Lotions   Aveeno Moisturizing lotion  Cetaphil Moisturizing Cream  Cetaphil Moisturizing Lotion  Clairol Herbal Essence Moisturizing Lotion, Dry Skin  Clairol Herbal Essence Moisturizing Lotion, Extra Dry Skin  Clairol Herbal Essence Moisturizing Lotion, Normal Skin  Curel Age Defying Therapeutic Moisturizing Lotion with Alpha Hydroxy  Curel Extreme Care Body Lotion  Curel Soothing Hands Moisturizing Hand Lotion  Curel Therapeutic Moisturizing Cream, Fragrance-Free  Curel Therapeutic Moisturizing Lotion, Fragrance-Free  Curel Therapeutic Moisturizing Lotion, Original Formula  Eucerin Daily Replenishing Lotion  Eucerin Dry Skin Therapy Plus Alpha Hydroxy Crme  Eucerin Dry Skin Therapy Plus Alpha Hydroxy Lotion  Eucerin Original Crme  Eucerin Original Lotion  Eucerin Plus Crme Eucerin Plus Lotion  Eucerin TriLipid Replenishing Lotion  Keri Anti-Bacterial Hand Lotion  Keri Deep Conditioning Original Lotion Dry  Skin Formula Softly Scented  Keri Deep Conditioning Original Lotion, Fragrance Free Sensitive Skin Formula  Keri Lotion Fast Absorbing Fragrance Free Sensitive Skin Formula  Keri Lotion Fast Absorbing Softly Scented Dry Skin Formula  Keri Original Lotion  Keri Skin Renewal Lotion Keri Silky Smooth Lotion  Keri Silky Smooth Sensitive Skin Lotion  Nivea Body Creamy Conditioning Oil  Nivea Body Extra Enriched Lotion  Nivea Body Original Lotion  Nivea Body Sheer Moisturizing Lotion Nivea Crme  Nivea Skin Firming Lotion  NutraDerm 30 Skin Lotion  NutraDerm Skin Lotion  NutraDerm Therapeutic Skin Cream  NutraDerm Therapeutic Skin Lotion  ProShield Protective Hand Cream  Provon moisturizing lotion  [place]- Preparing for Total Shoulder Arthroplasty    Before surgery, you can play an important role. Because skin is not sterile, your skin needs to be as free of germs as possible. You can reduce the number of germs on your skin by using  the following products. Benzoyl Peroxide Gel Reduces the number of germs present on the skin Applied twice a day to shoulder area starting two days before surgery    ==================================================================  Please follow these instructions carefully:  BENZOYL PEROXIDE 5% GEL  Please do not use if you have an allergy to benzoyl peroxide.   If your skin becomes reddened/irritated stop using the benzoyl peroxide.  Starting two days before surgery, apply as follows: Apply benzoyl peroxide in the morning and at night. Apply after taking a shower. If you are not taking a shower clean entire shoulder front, back, and side along with the armpit with a clean wet washcloth.  Place a quarter-sized dollop on your shoulder and rub in thoroughly, making sure to cover the front, back, and side of your shoulder, along with the armpit.   2 days before ____ AM   ____ PM              1 day before ____ AM   ____ PM                         Do  this twice a day for two days.  (Last application is the night before surgery, AFTER using the CHG soap as described below).  Do NOT apply benzoyl peroxide gel on the day of surgery.

## 2023-11-26 ENCOUNTER — Ambulatory Visit (INDEPENDENT_AMBULATORY_CARE_PROVIDER_SITE_OTHER): Admitting: Family Medicine

## 2023-11-26 ENCOUNTER — Encounter: Payer: Self-pay | Admitting: Family Medicine

## 2023-11-26 VITALS — BP 114/60 | HR 73 | Temp 97.4°F | Ht 68.0 in | Wt 139.0 lb

## 2023-11-26 DIAGNOSIS — F17201 Nicotine dependence, unspecified, in remission: Secondary | ICD-10-CM

## 2023-11-26 DIAGNOSIS — I5032 Chronic diastolic (congestive) heart failure: Secondary | ICD-10-CM

## 2023-11-26 DIAGNOSIS — J42 Unspecified chronic bronchitis: Secondary | ICD-10-CM

## 2023-11-26 DIAGNOSIS — J209 Acute bronchitis, unspecified: Secondary | ICD-10-CM | POA: Diagnosis not present

## 2023-11-26 MED ORDER — PREDNISONE 20 MG PO TABS: 20.0000 mg | ORAL_TABLET | Freq: Every day | ORAL | 0 refills | Status: DC

## 2023-11-26 MED ORDER — DOXYCYCLINE HYCLATE 100 MG PO TABS: 100.0000 mg | ORAL_TABLET | Freq: Two times a day (BID) | ORAL | 0 refills | Status: DC

## 2023-11-26 NOTE — Assessment & Plan Note (Signed)
 BNP recently mildly elevated along with a weight gain of about 5 lbs. Weight is down 3 lbs after taking furosemide  20 mg daily for 5 days. Continue carvedilol  12.5 mg bid.

## 2023-11-26 NOTE — Assessment & Plan Note (Signed)
 Congratulated Brandon [name] for stopping smoking. 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

## 2023-11-26 NOTE — Progress Notes (Signed)
 [facility] [address] [place] [address] Dept: [phone] Dept Fax: [phone]  Office Visit  Subjective:    Patient ID: Brandon [name], male    DOB: 1940/07/05, 83 y.o..   MRN: [id number]  Chief Complaint  Patient presents with   Follow-up    Follow up labs and having allergie/losing voice.   Having some congestion/sinus/chest. Has taken some mucinex .     History of Present Illness:  Patient is in today complaining of some lingering issues with chest congestion and cough. Brandon [name] was seen by Ms. Nche on 10/6 with a non-productive cough and sinus congestion. He had some associated DOE. He was treated with a course of antibiotics and Tessalon. She also renewed his Spiriva  and albuterol . She had obtained a BNP which was mildly elevated. He has a history of HFpEF. She advised him to take furosemide  20 mg daily for 5 days.  Brandon [name] notes he has some lingering cough still. Now he is coughing up thick, white mucous. He is on prednisone  5 mg daily secondary to polymyalgia rheumatica. He notes that he has stopped smoking againa nd feels glad that he could do this.  Brandon [name] is scheduled for 10/31 dor a left reverse shoulder arthroplasty.  Past Medical History: Patient Active Problem List   Diagnosis Date Noted   Rupture of left biceps tendon 10/01/2023   Preoperative examination 10/01/2023   Primary osteoarthritis, left shoulder 02/27/2023   Anemia of chronic disease 02/11/2023   Hyperglycemia 02/11/2023   Vitamin B12 deficiency 02/11/2023   Age related osteoporosis 08/31/2022   Malnutrition of mild degree 08/01/2022   Insomnia 06/26/2022   Arthritis of left hand 06/26/2022   Weight loss 06/26/2022   Chronic gout of multiple sites 04/30/2022   Chronically on opiate therapy 02/12/2022   Sacroiliac joint pain 02/12/2022   At risk for polypharmacy 01/19/2022   Trochanteric bursitis of left hip 01/02/2022   Dysfunction of  left eustachian tube 12/05/2021   Hypotension 08/18/2021   Hyponatremia 06/26/2021   Hyperkalemia 06/26/2021   Acute metabolic encephalopathy 06/26/2021   Myoclonic jerking 06/26/2021   Lumbar radiculopathy 06/08/2021   Lumbar postlaminectomy syndrome 05/03/2021   Degeneration of lumbar intervertebral disc 05/03/2021   Lumbar spondylosis 05/03/2021   History of hip fracture 04/24/2021   S/P abdominal aortic aneurysm repair 04/24/2021   Chronic allergic rhinitis 01/24/2021   Demand ischemia (HCC)    Elevated troponin    Chronic diastolic heart failure (HCC) 12/03/2020   Steatosis of liver 11/01/2020   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   Hepatitis B core antibody positive 11/01/2020   Benign prostatic hyperplasia 11/01/2020   Chronic obstructive pulmonary disease (HCC) 07/20/2020   History of placement of stent in LAD coronary artery 07/20/2020   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   TIA (transient ischemic attack)    Esophageal stricture    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   Primary osteoarthritis of both hands 01/11/2020   Piriformis syndrome of both sides 09/23/2019   Sciatica associated with disorder of lumbar spine 04/24/2019   Primary osteoarthritis involving multiple joints 04/24/2019   Chronic pain syndrome 04/24/2019   Mixed anxiety and depressive disorder 04/15/2018   Oropharyngeal dysphagia 03/11/2018   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   Tobacco abuse disorder 01/10/2014   Essential hypertension 05/19/2013   Psoriasis 01/13/2009   Coronary artery disease 09/15/2008   Diverticulosis of colon 08/27/2007   History of benign prostatic hyperplasia 08/27/2007  Hyperlipidemia 03/19/2007   Gastroesophageal reflux disease 03/19/2007   Past Surgical History:  Procedure Laterality Date   ABDOMINAL AORTIC ENDOVASCULAR STENT GRAFT N/A 03/03/2021   Procedure: ABDOMINAL AORTIC ENDOVASCULAR STENT GRAFT;  Surgeon: Sheree Penne Bruckner, MD;  Location: [facility] OR;  Service: Vascular;  Laterality: N/A;   ABDOMINAL AORTOGRAM W/LOWER EXTREMITY N/A 04/10/2021   Procedure: ABDOMINAL AORTOGRAM W/LOWER EXTREMITY;  Surgeon: Sheree Penne Bruckner, MD;  Location: [facility] INVASIVE CV LAB;  Service: Cardiovascular;  Laterality: N/A;   BACK SURGERY     CARDIAC CATHETERIZATION     CATARACT EXTRACTION W/ INTRAOCULAR LENS  IMPLANT, BILATERAL Bilateral 01/2012 - 02/2012   CORONARY ATHERECTOMY N/A 04/27/2020   Procedure: CORONARY ATHERECTOMY;  Surgeon: Wonda Sharper, MD;  Location: [facility] INVASIVE CV LAB;  Service: Cardiovascular;  Laterality: N/A;   CORONARY IMAGING/OCT N/A 04/27/2020   Procedure: INTRAVASCULAR IMAGING/OCT;  Surgeon: Wonda Sharper, MD;  Location: [facility] INVASIVE CV LAB;  Service: Cardiovascular;  Laterality: N/A;   CORONARY PRESSURE/FFR STUDY N/A 04/27/2020   Procedure: INTRAVASCULAR PRESSURE WIRE/FFR STUDY;  Surgeon: Wonda Sharper, MD;  Location: [facility] INVASIVE CV LAB;  Service: Cardiovascular;  Laterality: N/A;   CORONARY STENT INTERVENTION N/A 04/27/2020   Procedure: CORONARY STENT INTERVENTION;  Surgeon: Wonda Sharper, MD;  Location: [facility] INVASIVE CV LAB;  Service: Cardiovascular;  Laterality: N/A;   DIRECT LARYNGOSCOPY Left 10/09/2017   Procedure: DIRECT LARYNGOSCOPY WITH BOPSY;  Surgeon: Arlana Arnt, MD;  Location: [facility];  Service: ENT;  Laterality: Left;   ESOPHAGOGASTRODUODENOSCOPY (EGD) WITH ESOPHAGEAL DILATION     couple times (07/19/2016)   ESOPHAGOSCOPY Left 10/09/2017   Procedure: ESOPHAGOSCOPY;  Surgeon: Arlana Arnt, MD;  Location: [facility];  Service: ENT;  Laterality: Left;   IR GASTROSTOMY TUBE MOD SED  01/08/2018   IR THORACENTESIS ASP PLEURAL SPACE W/IMG GUIDE  07/19/2016   LAPAROSCOPIC CHOLECYSTECTOMY  1984   LUMBAR DISC SURGERY  05/1996   L4-5; Dr. Alix ILES LAMINECTOMY/DECOMPRESSION MICRODISCECTOMY  10/2002   L3-4. Dr. Alix   MULTIPLE TOOTH  EXTRACTIONS  1980s   PARTIAL GLOSSECTOMY  12/02/2017   Dr. Lauralee- [facility]   pharyngoplasty for closure of tingue base defect  12/02/2017   Dr. Lauralee- [facility]   PICC LINE INSERTION  07/15/2016   POSTERIOR LUMBAR FUSION  09/1996   Ray cage, L4-5 Dr. Mora   PROSTATE BIOPSY  ~ 2017   radical tonsillectomy Left 12/02/2017   Dr. [facility]alee [facility]   RIGHT HEART CATH AND CORONARY ANGIOGRAPHY N/A 03/31/2020   Procedure: RIGHT HEART CATH AND CORONARY ANGIOGRAPHY;  Surgeon: Rolan Ezra RAMAN, MD;  Location: [facility] INVASIVE CV LAB;  Service: Cardiovascular;  Laterality: N/A;   RIGID BRONCHOSCOPY Left 10/09/2017   Procedure: RIGID BRONCHOSCOPY;  Surgeon: Arlana Arnt, MD;  Location:  SURGERY CENTER;  Service: ENT;  Laterality: Left;   TEE WITHOUT CARDIOVERSION N/A 06/07/2020   Procedure: TRANSESOPHAGEAL ECHOCARDIOGRAM (TEE);  Surgeon: Wonda Sharper, MD;  Location: [facility] OR;  Service: Open Heart Surgery;  Laterality: N/A;   TONSILLECTOMY     TOTAL HIP ARTHROPLASTY Left 12/04/2020   Procedure: TOTAL HIP ARTHROPLASTY ANTERIOR APPROACH;  Surgeon: Fidel Rogue, MD;  Location: [facility] ORS;  Service: Orthopedics;  Laterality: Left;   TOTAL HIP ARTHROPLASTY Left 11/2020   TRACHEOST[facility]Y  12/02/2017   [facility]   ULTRASOUND GUIDANCE FOR VASCULAR ACCESS Right 06/07/2020   Procedure: ULTRASOUND GUIDANCE FOR VASCULAR ACCESS;  Surgeon: Wonda Sharper, MD;  Location: [facility] OR;  Service: Open Heart  Surgery;  Laterality: Right;   ULTRASOUND GUIDANCE FOR VASCULAR ACCESS Bilateral 03/03/2021   Procedure: ULTRASOUND GUIDANCE FOR VASCULAR ACCESS, BILATERAL FEMORAL ARTERIES;  Surgeon: Sheree Penne Bruckner, MD;  Location: [facility] OR;  Service: Vascular;  Laterality: Bilateral;   VASCULAR SURGERY     Family History  Problem Relation Age of Onset   Heart disease Father 42       Living   Coronary artery disease Father        CABG   Alzheimer's disease Mother 87       Deceased   Arthritis Mother     Aneurysm Brother    Stomach cancer Maternal Uncle    Brain cancer Maternal Aunt        x2   Obesity Daughter        Had Bypass Sx   Outpatient Medications Prior to Visit  Medication Sig Dispense Refill   acetaminophen  (TYLENOL ) 500 MG tablet Take 1,000 mg by mouth every 8 (eight) hours as needed for mild pain or headache.     albuterol  (VENTOLIN  HFA) 108 (90 Base) MCG/ACT inhaler Inhale 2 puffs into the lungs every 6 (six) hours as needed for wheezing or shortness of breath. 8 g 2   allopurinol  (ZYLOPRIM ) 100 MG tablet Take 2 tablets (200 mg total) by mouth daily. 180 tablet 1   amLODipine  (NORVASC ) 10 MG tablet Take 1 tablet (10 mg total) by mouth daily. 90 tablet 3   aspirin  EC 81 MG tablet Take 81 mg by mouth at bedtime. Swallow whole.     atorvastatin  (LIPITOR ) 80 MG tablet Take 1 tablet (80 mg total) by mouth daily. 90 tablet 3   benzonatate (TESSALON) 100 MG capsule Take 1 capsule (100 mg total) by mouth 3 (three) times daily as needed. 30 capsule 0   carvedilol  (COREG ) 12.5 MG tablet Take 1 tablet (12.5 mg total) by mouth 2 (two) times daily. 180 tablet 3   citalopram  (CELEXA ) 20 MG tablet 20 mg by oral route.     cyanocobalamin  (VITAMIN B12) 1000 MCG tablet Take 1 tablet (1,000 mcg total) by mouth daily. 90 tablet 3   dorzolamide -timolol  (COSOPT ) 2-0.5 % ophthalmic solution Place 1 drop into the left eye 2 (two) times daily. 1 mL 12   ezetimibe  (ZETIA ) 10 MG tablet TAKE 1 TABLET ONCE DAILY 90 tablet 3   feeding supplement (BOOST HIGH PROTEIN) LIQD Take 1 Container by mouth 3 (three) times daily between meals.     ferrous sulfate  325 (65 FE) MG EC tablet Take 1 tablet (325 mg total) by mouth daily with breakfast. 30 tablet 0   FLUoxetine  (PROZAC ) 20 MG capsule Take 1 capsule (20 mg total) by mouth daily. 90 capsule 3   fluticasone  (FLONASE ) 50 MCG/ACT nasal spray SHAKE LIQUID AND USE 2 SPRAYS IN EACH NOSTRIL DAILY AS NEEDED FOR RHINITIS OR ALLERGIES 16 g 11   furosemide  (LASIX ) 20 MG  tablet Take 1 tablet (20 mg total) by mouth daily as needed for fluid or edema. 30 tablet 3   gabapentin  (NEURONTIN ) 300 MG capsule TAKE 1 CAPSULE(300 MG) BY MOUTH TWICE DAILY 60 capsule 5   guaiFENesin  (MUCINEX ) 600 MG 12 hr tablet Take 600 mg by mouth 2 (two) times daily as needed for cough or to loosen phlegm.     hydrocortisone  cream 0.5 % Apply 1 Application topically as needed.     hydrOXYzine  (ATARAX ) 10 MG tablet Take 1-2 tablets (10-20 mg total) by mouth at bedtime as needed for itching (  or sleep). 30 tablet 3   ipratropium (ATROVENT ) 0.02 % nebulizer solution Use 1 vial (0.5 mg total) by nebulization every 6 (six) hours as needed for wheezing or shortness of breath. 75 mL 12   mirtazapine  (REMERON ) 7.5 MG tablet TAKE 1 TABLET(7.5 MG) BY MOUTH AT BEDTIME 30 tablet 3   morphine  (MS CONTIN ) 15 MG 12 hr tablet Take 1 tablet (15 mg total) by mouth every 12 (twelve) hours. 60 tablet 0   morphine  (MS CONTIN ) 15 MG 12 hr tablet Take 1 tablet (15 mg total) by mouth every 12 (twelve) hours. Fill today- 8/6 60 tablet 0   naloxone  (NARCAN ) nasal spray 4 mg/0.1 mL For possible accidentally overdose. 2 each 5   Oxycodone  HCl 10 MG TABS Take 1 tablet (10 mg total) by mouth every 4 (four) hours as needed. 150 tablet 0   Oxycodone  HCl 10 MG TABS Take 1 tablet (10 mg total) by mouth every 4 (four) hours as needed. Max 5 pills/day-  G89.3 chronic pain due to cancer 150 tablet 0   pantoprazole  (PROTONIX ) 40 MG tablet Take 1 tablet (40 mg total) by mouth daily. 90 tablet 3   potassium chloride  (KLOR-CON ) 10 MEQ tablet TAKE 1 TABLET(10 MEQ) BY MOUTH FOUR TIMES DAILY 120 tablet 3   predniSONE  (DELTASONE ) 1 MG tablet Take 3 tablets (3 mg total) by mouth daily with breakfast. (Along with a 5mg  tablet) 270 tablet 0   predniSONE  (DELTASONE ) 5 MG tablet TAKE 1 TABLET(5 MG) BY MOUTH DAILY WITH BREAKFAST 90 tablet 0   tamsulosin  (FLOMAX ) 0.4 MG CAPS capsule Take 1 capsule (0.4 mg total) by mouth daily. 90 capsule 3    Tiotropium Bromide  Monohydrate (SPIRIVA  RESPIMAT) 2.5 MCG/ACT AERS Inhale 2 puffs into the lungs daily. 4 g 6   traMADol  (ULTRAM ) 50 MG tablet      traZODone  (DESYREL ) 50 MG tablet TAKE 1/2 TO 1 TABLET(25 TO 50 MG) BY MOUTH AT BEDTIME AS NEEDED FOR SLEEP 30 tablet 3   triamcinolone  cream (KENALOG ) 0.1 % Apply 1 Application topically 2 (two) times daily as needed (to itchy sites).     trolamine salicylate (ASPERCREME) 10 % cream Apply 1 Application topically 2 (two) times daily as needed (arthritis pain).     zoledronic  acid (RECLAST ) 5 MG/100ML SOLN injection Inject 5 mg into the vein once. 09/11/2022 and 10/08/2023     No facility-administered medications prior to visit.   Allergies  Allergen Reactions   Tape Other (See Comments)    Tears skin. Prefers paper tape, Please   Celebrex [Celecoxib] Hives and Itching     Objective:   Today's Vitals   11/26/23 1525  BP: 114/60  Pulse: 73  Temp: (!) 97.4 F (36.3 C)  TempSrc: Temporal  SpO2: 93%  Weight: 139 lb (63 kg)  Height: 5' 8 (1.727 m)   Body mass index is 21.13 kg/m.   General: Well developed, well nourished. No acute distress. Lungs: Diffuse wheezing throughout the chest. No rales or rhonchi. Extremities: No edema noted. Psych: Alert and oriented. Normal mood and affect.  Health Maintenance Due  Topic Date Due   Zoster Vaccines- Shingrix (1 of 2) Never done   Colonoscopy  03/22/2020   Lab Results ProBNP (last 3 results) Recent Labs    11/11/23 1401  PROBNP 177.0*     Assessment & Plan:   Problem List Items Addressed This Visit       Cardiovascular and Mediastinum   Chronic diastolic heart failure (HCC)  BNP recently mildly elevated along with a weight gain of about 5 lbs. Weight is down 3 lbs after taking furosemide  20 mg daily for 5 days. Continue carvedilol  12.5 mg bid.        Other   Tobacco use disorder, moderate, in early remission   Congratulated Mr. [name] for stopping smoking. 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      Other Visit Diagnoses       Acute exacerbation of chronic bronchitis (HCC)    -  Primary   I will extend the doxycycline  course. I will also add a boost of steroids 20 mg dialy for 10 days.   Relevant Medications   doxycycline  (VIBRA -TABS) 100 MG tablet   predniSONE  (DELTASONE ) 20 MG tablet       Return in about 3 months (around 02/26/2024) for Reassessment.   Garnette CHRISTELLA Simpler, MD

## 2023-11-28 ENCOUNTER — Other Ambulatory Visit: Payer: Self-pay

## 2023-11-28 ENCOUNTER — Encounter (HOSPITAL_COMMUNITY): Payer: Self-pay

## 2023-11-28 ENCOUNTER — Encounter (HOSPITAL_COMMUNITY)
Admission: RE | Admit: 2023-11-28 | Discharge: 2023-11-28 | Disposition: A | Discharge status: Home / Self Care | Source: Ambulatory Visit | Attending: Orthopedic Surgery | Admitting: Orthopedic Surgery

## 2023-11-28 VITALS — BP 119/62 | HR 80 | Temp 99.3°F | Resp 16 | Ht 68.0 in | Wt 137.0 lb

## 2023-11-28 DIAGNOSIS — Z01812 Encounter for preprocedural laboratory examination: Secondary | ICD-10-CM | POA: Insufficient documentation

## 2023-11-28 DIAGNOSIS — Z01818 Encounter for other preprocedural examination: Secondary | ICD-10-CM

## 2023-11-28 HISTORY — DX: Chronic obstructive pulmonary disease, unspecified: J44.9

## 2023-11-28 LAB — BASIC METABOLIC PANEL WITH GFR
Anion gap: 11 (ref 5–15)
BUN: 21 mg/dL (ref 8–23)
CO2: 25 mmol/L (ref 22–32)
Calcium: 8.3 mg/dL — ABNORMAL LOW (ref 8.9–10.3)
Chloride: 102 mmol/L (ref 98–111)
Creatinine, Ser: 0.66 mg/dL (ref 0.61–1.24)
GFR, Estimated: >60 mL/min (ref >60)
Glucose, Bld: 140 mg/dL — ABNORMAL HIGH (ref 70–99)
Potassium: 4.5 mmol/L (ref 3.5–5.1)
Sodium: 138 mmol/L (ref 135–145)

## 2023-11-28 LAB — SURGICAL PCR SCREEN
MRSA, PCR: NEGATIVE
Staphylococcus aureus: NEGATIVE

## 2023-11-28 LAB — CBC
HCT: 38.3 % — ABNORMAL LOW (ref 39.0–52.0)
Hemoglobin: 11.5 g/dL — ABNORMAL LOW (ref 13.0–17.0)
MCH: 27.3 pg (ref 26.0–34.0)
MCHC: 30 g/dL (ref 30.0–36.0)
MCV: 91 fL (ref 80.0–100.0)
Platelets: 113 K/uL — ABNORMAL LOW (ref 150–400)
RBC: 4.21 MIL/uL — ABNORMAL LOW (ref 4.22–5.81)
RDW: 15.2 % (ref 11.5–15.5)
WBC: 10.9 K/uL — ABNORMAL HIGH (ref 4.0–10.5)
nRBC: 0 % (ref 0.0–0.2)

## 2023-11-29 ENCOUNTER — Other Ambulatory Visit: Payer: Self-pay | Admitting: Rheumatology

## 2023-11-29 NOTE — Telephone Encounter (Signed)
 Last Fill: 05/28/2023  Labs: 11/28/2023 CBC & BMP: WBC 10.9, RBC 4.21, hemoglobin 11.5, HCT 38.3, platelets 113, glucose 140, calcium  8.3 01/24/2023 uric acid: 4.4  Next Visit: due 12/2023, message sent to the front desk to schedule.   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   Okay to refill Allopurinol ?

## 2023-12-04 ENCOUNTER — Telehealth: Payer: Self-pay | Admitting: *Deleted

## 2023-12-04 NOTE — Telephone Encounter (Signed)
 Brandon [name] called for a refill on his oxycodone  and Morphine  Sulfate. Per the PMP last fills were 11/10/23 for both and he does not have additional rxs at the pharmacy. He is planned to have an arthroplasty on 12/13/23 left shoulder.

## 2023-12-04 NOTE — Telephone Encounter (Signed)
 Patient requesting his pain medication be filled early. Before he has surgery. Call back phone [phone].

## 2023-12-05 ENCOUNTER — Encounter (HOSPITAL_COMMUNITY): Payer: Self-pay

## 2023-12-05 NOTE — Progress Notes (Signed)
 Case: [id number] Date/Time: 12/13/23 1430   Procedure: ARTHROPLASTY, SHOULDER, TOTAL, REVERSE (Left: Shoulder)   Anesthesia type: Choice   Diagnosis: Arthritis of left shoulder region [M19.012]   Pre-op diagnosis: Primary osteoarthritis, left shoulder   Location: [facility] ROOM 06 / [facility]   Surgeons: Kay Kemps, MD       DISCUSSION: Brandon [name] is an 83 yo male with PMH of former smoking, HTN, CAD s/p PCI to LAD (2022), AS s/p TAVR (2022), AAA s/p EVAR (02/2021), COPD, hx of TIA, GERD, hiatal hernia, esophageal stricture, Hep B, PMR on prednisone , chronic pain syndrome with narcotic dependence, hx of tonsil cancer s/p radiation (~2020), depression, hx of lumbar fusion.  Review of anesthesia records shows that glidescope was been used for last 3 intubations.  Follows with cardiology for history of aortic stenosis s/p TAVR 06/07/2020 (echo 07/2021 with mean gradient 8 mmHg and trivial PVL), CAD (LHC in 2/22 with CTO RCA with collaterals, 95% pLAD treated with DES, 60-70% proximal LCx (FFR negative). No further exertional chest pain since PCI.),  HTN, HLD, diastolic CHF.  Last seen by Harlene Gainer, NP on 06/12/23. Noted to be doing well. No cardiac symptoms. Had cardiac clearance tele visit on 10/02/23 and risk assessment done:   Preoperative Cardiovascular Risk Assessment: - Patient is RCRI score 6.6% The patient affirms he has been doing well without any new cardiac symptoms. They are able to achieve 6 METS without cardiac limitations. Therefore, based on ACC/AHA guidelines, the patient would be at acceptable risk for the planned procedure without further cardiovascular testing.  Patient underwent AAA repair in 02/2021. He follows with Vascular and was last seen on 05/22/23. Duplex done and shows stent graft repair is patent without endoleak. Advised 1 year f/u.  History of tonsillar squamous cell cancer diagnosed 2019, s/p radiation and resection.   Seen by PCP on 10/01/23 for pre op clearance. Per  Dr. Thedora: Preoperative examination -  Brandon [name] is at moderate risk for complications. I recommend that he have regional anesthesia (such as a scalene block) with procedural sedation rather than general anesthesia. His prior tongue cancer with surgery and radiation could make management of an ET tube difficult. Do recommend cardiology clearance as well. He is at moderate risk for healing issues in light of his weight loss, low protein/albumin , and chronic prednisone  use. His chronic anemia places him at risk for worsening anemia, so this should be followed closely in the postoperative period.  Seen for urgent visit on 10/6 for COPD exacerbation and treated with Doxy. Pt returned on 10/21 for ongoing symptoms and was treated for Doxy and higher dose of Prednisone . Discussed with Dr Annette office that patient should be 2 weeks asymptomatic prior to proceeding and case moved to 11/7  VS: BP 119/62   Pulse 80   Temp 37.4 C (Oral)   Resp 16   Ht 5' 8 (1.727 m)   Wt 62.1 kg   SpO2 94%   BMI 20.83 kg/m   PROVIDERS: Thedora Garnette HERO, MD   LABS: Labs reviewed: Acceptable for surgery. (all labs ordered are listed, but only abnormal results are displayed)  Labs Reviewed  BASIC METABOLIC PANEL WITH GFR - Abnormal; Notable for the following components:      Result Value   Glucose, Bld 140 (*)    Calcium  8.3 (*)    All other components within normal limits  CBC - Abnormal; Notable for the following components:   WBC 10.9 (*)    RBC 4.21 (*)  Hemoglobin 11.5 (*)    HCT 38.3 (*)    Platelets 113 (*)    All other components within normal limits  SURGICAL PCR SCREEN    EKG 06/12/23:  Sinus bradycardia with Blocked Premature atrial complexes When compared with ECG of 18-Jan-2022 00:50, ectopy is new  AAA Duplex 05/22/23:  Summary: Abdominal Aorta: The largest aortic diameter remains essentially unchanged compared to prior exam. Previous diameter measurement was 4.9 x 5.04 cm obtained  on 05/03/2022. Bilateral common iliac stents appear patent.  Echo 07/13/2021:  IMPRESSIONS    1. Left ventricular ejection fraction, by estimation, is 65 to 70%. The left ventricle has normal function. The left ventricle has no regional wall motion abnormalities. Left ventricular diastolic parameters are consistent with Grade I diastolic dysfunction (impaired relaxation).  2. Right ventricular systolic function is normal. The right ventricular size is normal.  3. Left atrial size was moderately dilated.  4. The mitral valve is normal in structure. No evidence of mitral valve regurgitation. No evidence of mitral stenosis.  5. Mild TAVR perivalvular leak, 29 mm Edwards Sapien 3 THV via the subclavian approach on 06/07/20. The aortic valve has been repaired/replaced. Aortic valve regurgitation is not visualized. No aortic stenosis is present. There is a 29 mm Sapien prosthetic (TAVR) valve present in the aortic position. Procedure Date: 06/07/20. Echo findings are consistent with perivalvular leak of the aortic prosthesis. Aortic valve mean gradient measures 8.0 mmHg. Aortic valve Vmax measures 2.11 m/s.  6. Aortic dilatation noted. There is mild dilatation of the ascending aorta, measuring 40 mm.  7. The inferior vena cava is normal in size with greater than 50% respiratory variability, suggesting right atrial pressure of 3 mmHg.  Cath 04/27/2020:  1.  Successful orbital atherectomy and stenting of severe calcific stenosis in the proximal LAD, procedure guided by OCT imaging, treated with a 4.0 x 12 mm resolute Onyx DES 2.  Moderate left circumflex stenosis with negative pressure wire analysis. Past Medical History:  Diagnosis Date   Abdominal aneurysm    Arthritis    all over (07/19/2016)   BPH (benign prostatic hypertrophy)    CAD (coronary artery disease)    Chicken pox    Chronic lower back pain    s/p surgical fusion   COPD (chronic obstructive pulmonary disease) (HCC)     Depression    Diverticulosis    Esophageal stricture    GERD (gastroesophageal reflux disease)    Heart murmur    Hepatitis B 1984   Hiatal hernia    History of radiation therapy 01/16/18- 03/05/18   Left Tonsil, 66 Gy in 33 fractions to high risk nodal echelons.    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 Wonda and Dr Lucas    Severe aortic stenosis    Stroke [facility])    TIA - 20 years ago   TIA (transient ischemic attack) 1990s   hx   tonsillar ca dx'd 11/2017   Tubular adenoma of colon 2009    Past Surgical History:  Procedure Laterality Date   ABDOMINAL AORTIC ENDOVASCULAR STENT GRAFT N/A 03/03/2021   Procedure: ABDOMINAL AORTIC ENDOVASCULAR STENT GRAFT;  Surgeon: Sheree Penne Bruckner, MD;  Location: [facility] OR;  Service: Vascular;  Laterality: N/A;   ABDOMINAL AORTOGRAM W/LOWER EXTREMITY N/A 04/10/2021  Procedure: ABDOMINAL AORTOGRAM W/LOWER EXTREMITY;  Surgeon: Sheree Penne Bruckner, MD;  Location: [facility] INVASIVE CV LAB;  Service: Cardiovascular;  Laterality: N/A;   BACK SURGERY     CARDIAC CATHETERIZATION     CATARACT EXTRACTION W/ INTRAOCULAR LENS  IMPLANT, BILATERAL Bilateral 01/2012 - 02/2012   CORONARY ATHERECTOMY N/A 04/27/2020   Procedure: CORONARY ATHERECTOMY;  Surgeon: Wonda Sharper, MD;  Location: [facility] INVASIVE CV LAB;  Service: Cardiovascular;  Laterality: N/A;   CORONARY IMAGING/OCT N/A 04/27/2020   Procedure: INTRAVASCULAR IMAGING/OCT;  Surgeon: Wonda Sharper, MD;  Location: [facility] INVASIVE CV LAB;  Service: Cardiovascular;  Laterality: N/A;   CORONARY PRESSURE/FFR STUDY N/A 04/27/2020   Procedure: INTRAVASCULAR PRESSURE WIRE/FFR STUDY;  Surgeon: Wonda Sharper, MD;  Location: [facility] INVASIVE CV LAB;  Service: Cardiovascular;  Laterality: N/A;   CORONARY STENT INTERVENTION N/A  04/27/2020   Procedure: CORONARY STENT INTERVENTION;  Surgeon: Wonda Sharper, MD;  Location: [facility] INVASIVE CV LAB;  Service: Cardiovascular;  Laterality: N/A;   DIRECT LARYNGOSCOPY Left 10/09/2017   Procedure: DIRECT LARYNGOSCOPY WITH BOPSY;  Surgeon: Arlana Arnt, MD;  Location: [facility];  Service: ENT;  Laterality: Left;   ESOPHAGOGASTRODUODENOSCOPY (EGD) WITH ESOPHAGEAL DILATION     couple times (07/19/2016)   ESOPHAGOSCOPY Left 10/09/2017   Procedure: ESOPHAGOSCOPY;  Surgeon: Arlana Arnt, MD;  Location: [facility];  Service: ENT;  Laterality: Left;   IR GASTROSTOMY TUBE MOD SED  01/08/2018   IR THORACENTESIS ASP PLEURAL SPACE W/IMG GUIDE  07/19/2016   LAPAROSCOPIC CHOLECYSTECTOMY  1984   LUMBAR DISC SURGERY  05/1996   L4-5; Dr. Alix ILES LAMINECTOMY/DECOMPRESSION MICRODISCECTOMY  10/2002   L3-4. Dr. Alix   MULTIPLE TOOTH EXTRACTIONS  1980s   PARTIAL GLOSSECTOMY  12/02/2017   Dr. Lauralee- [facility]   pharyngoplasty for closure of tingue base defect  12/02/2017   Dr. Lauralee- [facility]   PICC LINE INSERTION  07/15/2016   POSTERIOR LUMBAR FUSION  09/1996   Ray cage, L4-5 Dr. Mora   PROSTATE BIOPSY  ~ 2017   radical tonsillectomy Left 12/02/2017   Dr. [facility]alee at [facility]   RIGHT HEART CATH AND CORONARY ANGIOGRAPHY N/A 03/31/2020   Procedure: RIGHT HEART CATH AND CORONARY ANGIOGRAPHY;  Surgeon: Rolan Ezra RAMAN, MD;  Location: [facility] INVASIVE CV LAB;  Service: Cardiovascular;  Laterality: N/A;   RIGID BRONCHOSCOPY Left 10/09/2017   Procedure: RIGID BRONCHOSCOPY;  Surgeon: Arlana Arnt, MD;  Location: [facility];  Service: ENT;  Laterality: Left;   TEE WITHOUT CARDIOVERSION N/A 06/07/2020   Procedure: TRANSESOPHAGEAL ECHOCARDIOGRAM (TEE);  Surgeon: Wonda Sharper, MD;  Location: [facility] OR;  Service: Open Heart Surgery;  Laterality: N/A;   TONSILLECTOMY     TOTAL HIP ARTHROPLASTY Left 12/04/2020   Procedure: TOTAL HIP ARTHROPLASTY  ANTERIOR APPROACH;  Surgeon: Fidel Rogue, MD;  Location[facility];  Service: Orthopedics;  Laterality: Left;   TOTAL HIP ARTHROPLASTY Left 11/2020   TRACHEOSTOMY  12/02/2017   Dr. [facility]alee- [facility]   ULTRASOUND GUIDANCE FOR VASCULAR ACCESS Right 06/07/2020   Procedure: ULTRASOUND GUIDANCE FOR VASCULAR ACCESS;  Surgeon: Wonda Sharper, MD;  Location: [facility] OR;  Service: Open Heart Surgery;  Laterality: Right;   ULTRASOUND GUIDANCE FOR VASCULAR ACCESS Bilateral 03/03/2021   Procedure: ULTRASOUND GUIDANCE FOR VASCULAR ACCESS, BILATERAL FEMORAL ARTERIES;  Surgeon: Sheree Penne Bruckner, MD;  Location: [facility] OR;  Service: Vascular;  Laterality: Bilateral;   VASCULAR SURGERY      MEDICATIONS:  acetaminophen  (TYLENOL ) 500 MG tablet   albuterol  (VENTOLIN   HFA) 108 (90 Base) MCG/ACT inhaler   allopurinol  (ZYLOPRIM ) 100 MG tablet   amLODipine  (NORVASC ) 10 MG tablet   aspirin  EC 81 MG tablet   atorvastatin  (LIPITOR ) 80 MG tablet   benzonatate (TESSALON) 100 MG capsule   carvedilol  (COREG ) 12.5 MG tablet   citalopram  (CELEXA ) 20 MG tablet   cyanocobalamin  (VITAMIN B12) 1000 MCG tablet   dorzolamide -timolol  (COSOPT ) 2-0.5 % ophthalmic solution   doxycycline  (VIBRA -TABS) 100 MG tablet   ezetimibe  (ZETIA ) 10 MG tablet   feeding supplement (BOOST HIGH PROTEIN) LIQD   ferrous sulfate  325 (65 FE) MG EC tablet   FLUoxetine  (PROZAC ) 20 MG capsule   fluticasone  (FLONASE ) 50 MCG/ACT nasal spray   furosemide  (LASIX ) 20 MG tablet   gabapentin  (NEURONTIN ) 300 MG capsule   guaiFENesin  (MUCINEX ) 600 MG 12 hr tablet   hydrocortisone  cream 0.5 %   hydrOXYzine  (ATARAX ) 10 MG tablet   ipratropium (ATROVENT ) 0.02 % nebulizer solution   mirtazapine  (REMERON ) 7.5 MG tablet   morphine  (MS CONTIN ) 15 MG 12 hr tablet   morphine  (MS CONTIN ) 15 MG 12 hr tablet   naloxone  (NARCAN ) nasal spray 4 mg/0.1 mL   Oxycodone  HCl 10 MG TABS   Oxycodone  HCl 10 MG TABS   pantoprazole  (PROTONIX ) 40 MG tablet   potassium chloride   (KLOR-CON ) 10 MEQ tablet   predniSONE  (DELTASONE ) 1 MG tablet   predniSONE  (DELTASONE ) 20 MG tablet   predniSONE  (DELTASONE ) 5 MG tablet   tamsulosin  (FLOMAX ) 0.4 MG CAPS capsule   Tiotropium Bromide  Monohydrate (SPIRIVA  RESPIMAT) 2.5 MCG/ACT AERS   traMADol  (ULTRAM ) 50 MG tablet   traZODone  (DESYREL ) 50 MG tablet   triamcinolone  cream (KENALOG ) 0.1 %   trolamine salicylate (ASPERCREME) 10 % cream   zoledronic  acid (RECLAST ) 5 MG/100ML SOLN injection   No current facility-administered medications for this encounter.   Burnard CHRISTELLA Odis DEVONNA [facility] Short Stay/Anesthesiology [facility] Phone [phone] 12/05/2023 10:12 AM

## 2023-12-05 NOTE — Telephone Encounter (Signed)
 PDMP was Reviewed.  Last fill date was 1005/2025, Brandon [name] prescriptions are at the pharmacy already. Call placed to Brandon [name] no answer left message regarding the above.

## 2023-12-05 NOTE — Anesthesia Preprocedure Evaluation (Addendum)
 Anesthesia Evaluation  Patient identified by MRN, date of birth, ID band Patient awake    Reviewed: Allergy & Precautions, NPO status , Patient's Chart, lab work & pertinent test results, reviewed documented beta blocker date and time   Airway Mallampati: III  TM Distance: >3 FB Neck ROM: Full    Dental  (+) Edentulous Upper   Pulmonary COPD,  COPD inhaler, former smoker Seen for urgent visit on 10/6 for COPD exacerbation and treated with Doxy. Pt returned on 10/21 for ongoing symptoms and was treated for Doxy and higher dose of Prednisone .  Quit smoking 2019, 75 pack year history   Hx tonsillar ca s/p radiation, tonsillectomy, paprtial glossectomy, pharyngoplasty. Temporary trach/G tube at the time (2019) (has been intubated w/ glide uneventfully since then)    + decreased breath sounds+ wheezing      Cardiovascular hypertension, Pt. on medications and Pt. on home beta blockers + CAD (s/p pPCI to LAD 2022)  Normal cardiovascular exam+ Valvular Problems/Murmurs (s/p pTAVR 2022)  Rhythm:Regular Rate:Normal  CAD s/p PCI to LAD (2022), AS s/p TAVR (2022), AAA s/p EVAR (02/2021)   Neuro/Psych  PSYCHIATRIC DISORDERS Anxiety Depression    TIA   GI/Hepatic hiatal hernia,GERD  Controlled,,(+) Hepatitis -, B  Endo/Other  negative endocrine ROS    Renal/GU negative Renal ROS  negative genitourinary   Musculoskeletal  (+) Arthritis , Osteoarthritis,  Chronic pain, multiple back surgeries- morphine  15, oxy 10   Abdominal   Peds  Hematology  (+) Blood dyscrasia, anemia Hb 11.5, plt 113   Anesthesia Other Findings   Reproductive/Obstetrics negative OB ROS                              Anesthesia Physical Anesthesia Plan  ASA: 4  Anesthesia Plan: General   Post-op Pain Management: Regional block* and Tylenol  PO (pre-op)*   Induction: Intravenous  PONV Risk Score and Plan: 2 and Ondansetron ,  Dexamethasone  and Treatment may vary due to age or medical condition  Airway Management Planned: Oral ETT  Additional Equipment: None  Intra-op Plan:   Post-operative Plan: Extubation in OR  Informed Consent: I have reviewed the patients History and Physical, chart, labs and discussed the procedure including the risks, benefits and alternatives for the proposed anesthesia with the patient or authorized representative who has indicated his/her understanding and acceptance.     Dental advisory given and Consent reviewed with POA  Plan Discussed with: CRNA  Anesthesia Plan Comments: (Last airway note (2023 EVAR): Ventilation: Mask ventilation without difficulty and Oral airway inserted - appropriate to patient size Laryngoscope Size: Glidescope and 4 Grade View: Grade I Tube type: Oral Number of attempts: 1   High risk for perioperative morbidity and mortality d/t multiple comorbidities, poor long function/poorly controlled COPD.  Extremely poor lung function. Presented to preop short of breath, saturating mid-80s on room air. Able to speak a few words at a time without stopping for air, coughing up white phlegm. Also extremely sleepy, falling asleep mid-conversation multiple times- per pt took valium this morning (not on med list, so I am unsure what the dose was and he also does not know dosage). Long conversation with daughter and patient about use of benzos in the elderly. Pt adamant about having nerve block for postop pain as he does have chronic pain. Described at length the complications that will almost certainly come with the interscalene nerve block and general anesthesia required for this procedure;  plan is already for patient to stay overnight. I described used of cpap/bipap and possible postoperative intubation/ventilation and ICU admission with the patient and daughter. Pt is full code.  D/w Dr. Kay who has now spoken to patient about local by surgeon instead of nerve  block to preserve phrenic function, pt now accepting of no nerve block.  RN checked in patient and now consistently febrile >101F. Dr. Kay ordering CXR and urine studies to determine whether pt needs ED admission.   CXR not formally read yet but pretty clear RML opacity seen (especially compared to clear lungs on last available CXR in August 2025). Upon chart review, does not appear that anyone treating pt for COPD exacerbation over the last 30days got a chest xray, despite needing escalation of steroids and Abx. D/w pt and daughter would recommend transfer to ED for possible admission to hospital and IV abx. )         Anesthesia Quick Evaluation

## 2023-12-09 ENCOUNTER — Encounter: Payer: Self-pay | Admitting: Radiology

## 2023-12-11 NOTE — Progress Notes (Signed)
 Attempted to obtain medical history via telephone, unable to reach at this time. HIPAA compliant voicemail message left requesting return call to pre surgical testing department.

## 2023-12-12 NOTE — Progress Notes (Addendum)
 Patient phoned to give updated information on surgery.  Surgery was rescheduled due to COPD exacerbation.  Patient states that he still has some congestion and mucous in the morning that improves as the day goes on, this is normal for him per patient.   Date of Surgery  - 12-13-23  Arrival Time - 10:15 and check in at admitting.    NPO Status - patient reminded to not eat solid food after midnight.  From midnight until 9:50 may have clear liquids  Medications morning of surgery - Allopurinol , Amlodipine , Atorvastatin , Carvedilol , Ezetimibe , Fluoxetine , Gabapentin , MS Contin , Pantoprazole , Tamsulosin .  Okay to use inhalers, nasal spray.  States has not taken Aspirin  in two weeks.   No change in medical history, allergies per patient.  Transportation home - Patient is an overnight stay.  All questions answered and patient stated understanding

## 2023-12-12 NOTE — Progress Notes (Signed)
 Attempted to obtain medical history via telephone, unable to reach at this time. HIPAA compliant voicemail message left requesting return call to pre surgical testing department.

## 2023-12-13 ENCOUNTER — Encounter (HOSPITAL_COMMUNITY): Payer: Self-pay | Admitting: Orthopedic Surgery

## 2023-12-13 ENCOUNTER — Observation Stay (HOSPITAL_COMMUNITY)
Admission: RE | Admit: 2023-12-13 | Discharge: 2023-12-14 | Disposition: A | Discharge status: Home / Self Care | Source: Ambulatory Visit | Attending: Internal Medicine | Admitting: Internal Medicine

## 2023-12-13 ENCOUNTER — Encounter (HOSPITAL_COMMUNITY)
Admission: RE | Disposition: A | Discharge status: Home / Self Care | Payer: Self-pay | Source: Ambulatory Visit | Attending: Emergency Medicine

## 2023-12-13 ENCOUNTER — Inpatient Hospital Stay (HOSPITAL_COMMUNITY): Payer: Self-pay | Admitting: Anesthesiology

## 2023-12-13 ENCOUNTER — Other Ambulatory Visit: Payer: Self-pay | Admitting: *Deleted

## 2023-12-13 ENCOUNTER — Ambulatory Visit (HOSPITAL_COMMUNITY)

## 2023-12-13 ENCOUNTER — Inpatient Hospital Stay (HOSPITAL_COMMUNITY): Payer: Self-pay | Admitting: Physician Assistant

## 2023-12-13 ENCOUNTER — Other Ambulatory Visit: Payer: Self-pay

## 2023-12-13 DIAGNOSIS — Z7982 Long term (current) use of aspirin: Secondary | ICD-10-CM | POA: Insufficient documentation

## 2023-12-13 DIAGNOSIS — F1090 Alcohol use, unspecified, uncomplicated: Secondary | ICD-10-CM | POA: Insufficient documentation

## 2023-12-13 DIAGNOSIS — C090 Malignant neoplasm of tonsillar fossa: Secondary | ICD-10-CM | POA: Diagnosis present

## 2023-12-13 DIAGNOSIS — Z79899 Other long term (current) drug therapy: Secondary | ICD-10-CM | POA: Diagnosis not present

## 2023-12-13 DIAGNOSIS — M353 Polymyalgia rheumatica: Secondary | ICD-10-CM | POA: Diagnosis not present

## 2023-12-13 DIAGNOSIS — R509 Fever, unspecified: Secondary | ICD-10-CM | POA: Diagnosis not present

## 2023-12-13 DIAGNOSIS — A419 Sepsis, unspecified organism: Principal | ICD-10-CM | POA: Diagnosis present

## 2023-12-13 DIAGNOSIS — Z01818 Encounter for other preprocedural examination: Secondary | ICD-10-CM

## 2023-12-13 DIAGNOSIS — R0902 Hypoxemia: Secondary | ICD-10-CM | POA: Diagnosis not present

## 2023-12-13 DIAGNOSIS — J189 Pneumonia, unspecified organism: Principal | ICD-10-CM | POA: Diagnosis present

## 2023-12-13 DIAGNOSIS — E119 Type 2 diabetes mellitus without complications: Secondary | ICD-10-CM | POA: Insufficient documentation

## 2023-12-13 DIAGNOSIS — Z794 Long term (current) use of insulin: Secondary | ICD-10-CM | POA: Diagnosis not present

## 2023-12-13 DIAGNOSIS — I10 Essential (primary) hypertension: Secondary | ICD-10-CM | POA: Diagnosis not present

## 2023-12-13 DIAGNOSIS — F1990 Other psychoactive substance use, unspecified, uncomplicated: Secondary | ICD-10-CM | POA: Insufficient documentation

## 2023-12-13 DIAGNOSIS — Z7952 Long term (current) use of systemic steroids: Secondary | ICD-10-CM | POA: Insufficient documentation

## 2023-12-13 DIAGNOSIS — R918 Other nonspecific abnormal finding of lung field: Secondary | ICD-10-CM | POA: Diagnosis not present

## 2023-12-13 DIAGNOSIS — R059 Cough, unspecified: Secondary | ICD-10-CM | POA: Diagnosis present

## 2023-12-13 DIAGNOSIS — R0600 Dyspnea, unspecified: Secondary | ICD-10-CM | POA: Diagnosis present

## 2023-12-13 LAB — COMPREHENSIVE METABOLIC PANEL WITH GFR
ALT: 18 U/L (ref 0–44)
AST: 31 U/L (ref 15–41)
Albumin: 3.4 g/dL — ABNORMAL LOW (ref 3.5–5.0)
Alkaline Phosphatase: 126 U/L (ref 38–126)
Anion gap: 9 (ref 5–15)
BUN: 13 mg/dL (ref 8–23)
CO2: 25 mmol/L (ref 22–32)
Calcium: 8.7 mg/dL — ABNORMAL LOW (ref 8.9–10.3)
Chloride: 101 mmol/L (ref 98–111)
Creatinine, Ser: 0.72 mg/dL (ref 0.61–1.24)
GFR, Estimated: >60 mL/min (ref >60)
Glucose, Bld: 111 mg/dL — ABNORMAL HIGH (ref 70–99)
Potassium: 3.6 mmol/L (ref 3.5–5.1)
Sodium: 135 mmol/L (ref 135–145)
Total Bilirubin: 0.9 mg/dL (ref 0.0–1.2)
Total Protein: 6 g/dL — ABNORMAL LOW (ref 6.5–8.1)

## 2023-12-13 LAB — RESP PANEL BY RT-PCR (RSV, FLU A&B, COVID)  RVPGX2
Influenza A by PCR: NEGATIVE
Influenza B by PCR: NEGATIVE
Resp Syncytial Virus by PCR: NEGATIVE
SARS Coronavirus 2 by RT PCR: NEGATIVE

## 2023-12-13 LAB — CBC WITH DIFFERENTIAL/PLATELET
Abs Immature Granulocytes: 0.13 K/uL — ABNORMAL HIGH (ref 0.00–0.07)
Basophils Absolute: 0 K/uL (ref 0.0–0.1)
Basophils Relative: 0 %
Eosinophils Absolute: 0.1 K/uL (ref 0.0–0.5)
Eosinophils Relative: 1 %
HCT: 38.1 % — ABNORMAL LOW (ref 39.0–52.0)
Hemoglobin: 11.6 g/dL — ABNORMAL LOW (ref 13.0–17.0)
Immature Granulocytes: 1 %
Lymphocytes Relative: 4 %
Lymphs Abs: 0.5 K/uL — ABNORMAL LOW (ref 0.7–4.0)
MCH: 27.2 pg (ref 26.0–34.0)
MCHC: 30.4 g/dL (ref 30.0–36.0)
MCV: 89.4 fL (ref 80.0–100.0)
Monocytes Absolute: 0.7 K/uL (ref 0.1–1.0)
Monocytes Relative: 5 %
Neutro Abs: 12.5 K/uL — ABNORMAL HIGH (ref 1.7–7.7)
Neutrophils Relative %: 89 %
Platelets: 72 K/uL — ABNORMAL LOW (ref 150–400)
RBC: 4.26 MIL/uL (ref 4.22–5.81)
RDW: 15.3 % (ref 11.5–15.5)
WBC: 14 K/uL — ABNORMAL HIGH (ref 4.0–10.5)
nRBC: 0 % (ref 0.0–0.2)

## 2023-12-13 LAB — URINALYSIS, ROUTINE W REFLEX MICROSCOPIC
Bilirubin Urine: NEGATIVE
Glucose, UA: NEGATIVE mg/dL
Hgb urine dipstick: NEGATIVE
Ketones, ur: NEGATIVE mg/dL
Leukocytes,Ua: NEGATIVE
Nitrite: NEGATIVE
Protein, ur: NEGATIVE mg/dL
Specific Gravity, Urine: 1.014 (ref 1.005–1.030)
pH: 5 (ref 5.0–8.0)

## 2023-12-13 LAB — PROTIME-INR
INR: 1.1 (ref 0.8–1.2)
Prothrombin Time: 14.7 s (ref 11.4–15.2)

## 2023-12-13 LAB — I-STAT CG4 LACTIC ACID, ED: Lactic Acid, Venous: 1 mmol/L (ref 0.5–1.9)

## 2023-12-13 LAB — PRO BRAIN NATRIURETIC PEPTIDE: Pro Brain Natriuretic Peptide: 1200 pg/mL — ABNORMAL HIGH (ref <300.0)

## 2023-12-13 SURGERY — ARTHROPLASTY, SHOULDER, TOTAL, REVERSE
Anesthesia: Choice | Site: Shoulder | Laterality: Left

## 2023-12-13 MED ORDER — TRAZODONE HCL 50 MG PO TABS: 25.0000 mg | ORAL_TABLET | Freq: Every evening | ORAL | Status: DC | PRN

## 2023-12-13 MED ORDER — FLUTICASONE PROPIONATE 50 MCG/ACT NA SUSP: 1.0000 | Freq: Every day | NASAL | Status: DC | PRN

## 2023-12-13 MED ORDER — ROCURONIUM BROMIDE 10 MG/ML (PF) SYRINGE
PREFILLED_SYRINGE | INTRAVENOUS | Status: AC
  Filled 2023-12-13: qty 10

## 2023-12-13 MED ORDER — LACTATED RINGERS IV SOLN: INTRAVENOUS | Status: DC

## 2023-12-13 MED ORDER — ONDANSETRON HCL 4 MG/2ML IJ SOLN
4.0000 mg | Freq: Four times a day (QID) | INTRAMUSCULAR | Status: DC | PRN
Start: 2023-12-13 — End: 2023-12-14

## 2023-12-13 MED ORDER — ACETAMINOPHEN 500 MG PO TABS
1000.0000 mg | ORAL_TABLET | Freq: Once | ORAL | Status: AC
  Filled 2023-12-13: qty 2

## 2023-12-13 MED ORDER — TAMSULOSIN HCL 0.4 MG PO CAPS
0.4000 mg | ORAL_CAPSULE | Freq: Every day | ORAL | Status: DC
  Administered 2023-12-14: 0.4 mg via ORAL
  Filled 2023-12-13: qty 1

## 2023-12-13 MED ORDER — ENOXAPARIN SODIUM 40 MG/0.4ML IJ SOSY
40.0000 mg | PREFILLED_SYRINGE | INTRAMUSCULAR | Status: DC
  Administered 2023-12-13: 40 mg via SUBCUTANEOUS
  Filled 2023-12-13: qty 0.4

## 2023-12-13 MED ORDER — ATORVASTATIN CALCIUM 40 MG PO TABS
80.0000 mg | ORAL_TABLET | Freq: Every day | ORAL | Status: DC
  Administered 2023-12-14: 80 mg via ORAL
  Filled 2023-12-13: qty 2

## 2023-12-13 MED ORDER — EZETIMIBE 10 MG PO TABS
10.0000 mg | ORAL_TABLET | Freq: Every day | ORAL | Status: DC
  Administered 2023-12-14: 10 mg via ORAL
  Filled 2023-12-13: qty 1

## 2023-12-13 MED ORDER — ORAL CARE MOUTH RINSE: 15.0000 mL | Freq: Once | OROMUCOSAL | Status: DC

## 2023-12-13 MED ORDER — LIDOCAINE HCL (PF) 2 % IJ SOLN
INTRAMUSCULAR | Status: AC
  Filled 2023-12-13: qty 5

## 2023-12-13 MED ORDER — ALBUTEROL SULFATE (2.5 MG/3ML) 0.083% IN NEBU
2.5000 mg | INHALATION_SOLUTION | RESPIRATORY_TRACT | Status: DC | PRN
Start: 2023-12-13 — End: 2023-12-14

## 2023-12-13 MED ORDER — OXYCODONE HCL 5 MG PO TABS
10.0000 mg | ORAL_TABLET | ORAL | Status: DC | PRN
  Administered 2023-12-13 – 2023-12-14 (×2): 10 mg via ORAL
  Filled 2023-12-13 (×2): qty 2

## 2023-12-13 MED ORDER — CHLORHEXIDINE GLUCONATE 0.12 % MT SOLN: 15.0000 mL | Freq: Once | OROMUCOSAL | Status: DC

## 2023-12-13 MED ORDER — ONDANSETRON HCL 4 MG PO TABS: 4.0000 mg | ORAL_TABLET | Freq: Four times a day (QID) | ORAL | Status: DC | PRN

## 2023-12-13 MED ORDER — SODIUM CHLORIDE 0.9 % IV SOLN
2.0000 g | Freq: Once | INTRAVENOUS | Status: AC
  Administered 2023-12-13: 2 g via INTRAVENOUS
  Filled 2023-12-13: qty 20

## 2023-12-13 MED ORDER — IPRATROPIUM-ALBUTEROL 0.5-2.5 (3) MG/3ML IN SOLN
3.0000 mL | Freq: Four times a day (QID) | RESPIRATORY_TRACT | Status: DC
Start: 2023-12-13 — End: 2023-12-14
  Administered 2023-12-13 – 2023-12-14 (×3): 3 mL via RESPIRATORY_TRACT
  Filled 2023-12-13 (×3): qty 3

## 2023-12-13 MED ORDER — SODIUM CHLORIDE 0.9 % IV SOLN
2.0000 g | INTRAVENOUS | Status: DC
Start: 2023-12-14 — End: 2023-12-14

## 2023-12-13 MED ORDER — ACETAMINOPHEN 325 MG PO TABS: 650.0000 mg | ORAL_TABLET | Freq: Four times a day (QID) | ORAL | Status: DC | PRN

## 2023-12-13 MED ORDER — SODIUM CHLORIDE 0.9 % IV SOLN
500.0000 mg | INTRAVENOUS | Status: DC
  Filled 2023-12-13: qty 5

## 2023-12-13 MED ORDER — ONDANSETRON HCL 4 MG/2ML IJ SOLN
INTRAMUSCULAR | Status: AC
  Filled 2023-12-13: qty 2

## 2023-12-13 MED ORDER — CEFAZOLIN SODIUM-DEXTROSE 2-4 GM/100ML-% IV SOLN
2.0000 g | INTRAVENOUS | Status: DC
  Filled 2023-12-13: qty 100

## 2023-12-13 MED ORDER — SODIUM CHLORIDE 0.9 % IV SOLN
500.0000 mg | Freq: Once | INTRAVENOUS | Status: AC
  Administered 2023-12-13: 500 mg via INTRAVENOUS
  Filled 2023-12-13: qty 5

## 2023-12-13 MED ORDER — PREDNISONE 5 MG PO TABS
8.0000 mg | ORAL_TABLET | Freq: Every day | ORAL | Status: DC
  Administered 2023-12-13 – 2023-12-14 (×2): 8 mg via ORAL
  Filled 2023-12-13 (×4): qty 3

## 2023-12-13 MED ORDER — PREDNISONE 5 MG PO TABS: ORAL_TABLET | ORAL | 0 refills | Status: DC

## 2023-12-13 MED ORDER — SUGAMMADEX SODIUM 200 MG/2ML IV SOLN
INTRAVENOUS | Status: AC
  Filled 2023-12-13: qty 2

## 2023-12-13 MED ORDER — FLUOXETINE HCL 20 MG PO CAPS
20.0000 mg | ORAL_CAPSULE | Freq: Every day | ORAL | Status: DC
  Administered 2023-12-14: 20 mg via ORAL
  Filled 2023-12-13: qty 1

## 2023-12-13 MED ORDER — ACETAMINOPHEN 500 MG PO TABS
1000.0000 mg | ORAL_TABLET | Freq: Once | ORAL | Status: AC
  Administered 2023-12-13: 1000 mg via ORAL
  Filled 2023-12-13: qty 2

## 2023-12-13 MED ORDER — PREDNISONE 1 MG PO TABS: 3.0000 mg | ORAL_TABLET | Freq: Every day | ORAL | 0 refills | Status: DC

## 2023-12-13 MED ORDER — ACETAMINOPHEN 650 MG RE SUPP: 650.0000 mg | Freq: Four times a day (QID) | RECTAL | Status: DC | PRN

## 2023-12-13 MED ORDER — TRANEXAMIC ACID-NACL 1000-0.7 MG/100ML-% IV SOLN
1000.0000 mg | INTRAVENOUS | Status: DC
  Filled 2023-12-13: qty 100

## 2023-12-13 MED ORDER — MORPHINE SULFATE ER 15 MG PO TBCR
15.0000 mg | EXTENDED_RELEASE_TABLET | Freq: Two times a day (BID) | ORAL | Status: DC
  Administered 2023-12-13: 15 mg via ORAL
  Filled 2023-12-13 (×2): qty 1

## 2023-12-13 MED ORDER — GABAPENTIN 300 MG PO CAPS
300.0000 mg | ORAL_CAPSULE | Freq: Two times a day (BID) | ORAL | Status: DC
  Administered 2023-12-13 – 2023-12-14 (×2): 300 mg via ORAL
  Filled 2023-12-13 (×2): qty 1

## 2023-12-13 MED ORDER — ACETAMINOPHEN 500 MG PO TABS
ORAL_TABLET | ORAL | Status: AC
  Administered 2023-12-13: 1000 mg via ORAL
  Filled 2023-12-13: qty 2

## 2023-12-13 MED ORDER — LACTATED RINGERS IV BOLUS (SEPSIS)
1000.0000 mL | Freq: Once | INTRAVENOUS | Status: AC
  Administered 2023-12-13: 1000 mL via INTRAVENOUS

## 2023-12-13 MED ORDER — VANCOMYCIN HCL 1000 MG IV SOLR
INTRAVENOUS | Status: AC
  Filled 2023-12-13: qty 20

## 2023-12-13 MED ORDER — PROPOFOL 10 MG/ML IV BOLUS
INTRAVENOUS | Status: AC
  Filled 2023-12-13: qty 20

## 2023-12-13 MED ORDER — BUPIVACAINE-EPINEPHRINE (PF) 0.25% -1:200000 IJ SOLN
INTRAMUSCULAR | Status: AC
  Filled 2023-12-13: qty 30

## 2023-12-13 MED ORDER — AMLODIPINE BESYLATE 5 MG PO TABS
10.0000 mg | ORAL_TABLET | Freq: Every day | ORAL | Status: DC
  Administered 2023-12-14: 10 mg via ORAL
  Filled 2023-12-13: qty 2

## 2023-12-13 MED ORDER — CARVEDILOL 12.5 MG PO TABS
12.5000 mg | ORAL_TABLET | Freq: Two times a day (BID) | ORAL | Status: DC
  Administered 2023-12-13 – 2023-12-14 (×2): 12.5 mg via ORAL
  Filled 2023-12-13 (×2): qty 1

## 2023-12-13 NOTE — Progress Notes (Signed)
 Patient procedure is cancelled today, patient transferred to the ED for further evaluation

## 2023-12-13 NOTE — Plan of Care (Signed)

## 2023-12-13 NOTE — Progress Notes (Signed)
 Audible congestion, O2 applied upon arrival for sats 88% and MD notified.  Sats improved to 94% on 2L O2.  Stat CXR obtained, unable to urinate for UA at this time. Febrile, see VS.

## 2023-12-13 NOTE — ED Provider Notes (Signed)
 [place] EMERGENCY DEPARTMENT AT [facility] Provider Note   CSN: [id number] Arrival date & time: 12/13/23  1251     Patient presents with: Pneumonia   Brandon [name] is a 83 y.o. male.   HPI Patient presents with his daughter. Patient was preparing for left shoulder arthroplasty today, notes that he has had a cough.  Upon arrival for preop patient was diaphoretic, with increased work of breathing, portable x-ray suggested opacification on the right side and he was sent here for evaluation.  I discussed the patient's case with his orthopedist prior to my evaluation of the patient. Patient notes that beyond cough, dyspnea, no pain.    Prior to Admission medications   Medication Sig Start Date End Date Taking? Authorizing Provider  acetaminophen  (TYLENOL ) 500 MG tablet Take 1,000 mg by mouth every 8 (eight) hours as needed for mild pain or headache.   Yes [provider]  albuterol  (VENTOLIN  HFA) 108 (90 Base) MCG/ACT inhaler Inhale 2 puffs into the lungs every 6 (six) hours as needed for wheezing or shortness of breath. 11/11/23  Yes Nche, Roselie Rockford, NP  allopurinol  (ZYLOPRIM ) 100 MG tablet TAKE 2 TABLETS(200 MG) BY MOUTH DAILY 11/29/23  Yes Deveshwar, Maya, MD  amLODipine  (NORVASC ) 10 MG tablet Take 1 tablet (10 mg total) by mouth daily. 06/12/23  Yes [place], Harlene HERO, FNP  atorvastatin  (LIPITOR ) 80 MG tablet Take 1 tablet (80 mg total) by mouth daily. 06/12/23  Yes Milford, Harlene HERO, FNP  carvedilol  (COREG ) 12.5 MG tablet Take 1 tablet (12.5 mg total) by mouth 2 (two) times daily. 06/12/23  Yes [place], Harlene HERO, FNP  cyanocobalamin  (VITAMIN B12) 1000 MCG tablet Take 1 tablet (1,000 mcg total) by mouth daily. 02/11/23  Yes Thedora Garnette HERO, MD  ezetimibe  (ZETIA ) 10 MG tablet TAKE 1 TABLET ONCE DAILY 06/12/23  Yes [place], [place], FNP  feeding supplement (BOOST HIGH PROTEIN) LIQD Take 1 Container by mouth in the morning.   Yes [provider]  FLUoxetine   (PROZAC ) 20 MG capsule Take 1 capsule (20 mg total) by mouth daily. 02/11/23  Yes RuddGarnette HERO, MD  fluticasone  (FLONASE ) 50 MCG/ACT nasal spray SHAKE LIQUID AND USE 2 SPRAYS IN EACH NOSTRIL DAILY AS NEEDED FOR RHINITIS OR ALLERGIES 11/14/20  Yes Thedora Garnette HERO, MD  gabapentin  (NEURONTIN ) 300 MG capsule TAKE 1 CAPSULE(300 MG) BY MOUTH TWICE DAILY 10/10/23  Yes Lovorn, Megan, MD  guaiFENesin  (MUCINEX ) 600 MG 12 hr tablet Take 600 mg by mouth daily.   Yes [provider]  hydrocortisone  cream 0.5 % Apply 1 Application topically as needed for itching.   Yes [provider]  hydrOXYzine  (ATARAX ) 10 MG tablet Take 1-2 tablets (10-20 mg total) by mouth at bedtime as needed for itching (or sleep). 01/09/23  Yes Thedora Garnette HERO, MD  ipratropium (ATROVENT ) 0.02 % nebulizer solution Use 1 vial (0.5 mg total) by nebulization every 6 (six) hours as needed for wheezing or shortness of breath. 08/21/21  Yes Sheikh, Omair Latif, DO  morphine  (MS CONTIN ) 15 MG 12 hr tablet Take 1 tablet (15 mg total) by mouth every 12 (twelve) hours. Fill today- 8/6 11/06/23  Yes Debby Fidela LITTIE, NP  naloxone  (NARCAN ) nasal spray 4 mg/0.1 mL For possible accidentally overdose. 02/12/22  Yes Lovorn, Megan, MD  Oxycodone  HCl 10 MG TABS Take 1 tablet (10 mg total) by mouth every 4 (four) hours as needed. 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 07/08/23 12/05/23 Yes Thedora Garnette HERO, MD  potassium chloride  (KLOR-CON ) 10 MEQ tablet TAKE 1 TABLET(10 MEQ) BY MOUTH FOUR TIMES DAILY 10/11/23  Yes [place], Harlene HERO, FNP  predniSONE  (DELTASONE ) 20 MG tablet Take 1 tablet (20 mg total) by mouth daily with breakfast. 11/26/23  Yes Thedora Garnette HERO, MD  tamsulosin  (FLOMAX ) 0.4 MG CAPS capsule Take 1 capsule (0.4 mg total) by mouth daily. 10/01/23  Yes Thedora Garnette HERO, MD  Tiotropium Bromide  Monohydrate (SPIRIVA   RESPIMAT) 2.5 MCG/ACT AERS Inhale 2 puffs into the lungs daily. Patient taking differently: Inhale 2 puffs into the lungs daily as needed (shortness of breath). 11/11/23  Yes Nche, Roselie Rockford, NP  triamcinolone  cream (KENALOG ) 0.1 % Apply 1 Application topically 2 (two) times daily as needed (to itchy sites).   Yes [provider]  trolamine salicylate (ASPERCREME) 10 % cream Apply 1 Application topically 2 (two) times daily as needed (arthritis pain).   Yes [provider]  aspirin  EC 81 MG tablet Take 81 mg by mouth at bedtime. Swallow whole.    [provider]  benzonatate (TESSALON) 100 MG capsule Take 1 capsule (100 mg total) by mouth 3 (three) times daily as needed. Patient not taking: Reported on 12/05/2023 11/11/23   Nche, Roselie Rockford, NP  dorzolamide -timolol  (COSOPT ) 2-0.5 % ophthalmic solution Place 1 drop into the left eye 2 (two) times daily. Patient not taking: Reported on 12/05/2023 03/16/23   Ruthell Lonni FALCON, PA-C  doxycycline  (VIBRA -TABS) 100 MG tablet Take 1 tablet (100 mg total) by mouth 2 (two) times daily. Patient not taking: Reported on 12/05/2023 11/26/23   Thedora Garnette HERO, MD  ferrous sulfate  325 (65 FE) MG EC tablet Take 1 tablet (325 mg total) by mouth daily with breakfast. Patient not taking: Reported on 12/05/2023 01/20/22 11/26/23  Cindy Garnette POUR, MD  furosemide  (LASIX ) 20 MG tablet Take 1 tablet (20 mg total) by mouth daily as needed for fluid or edema. Patient not taking: Reported on 12/05/2023 06/12/23   Glena Harlene HERO, FNP  mirtazapine  (REMERON ) 7.5 MG tablet TAKE 1 TABLET(7.5 MG) BY MOUTH AT BEDTIME Patient not taking: Reported on 12/05/2023 11/18/23   Thedora Garnette HERO, MD  morphine  (MS CONTIN ) 15 MG 12 hr tablet Take 1 tablet (15 mg total) by mouth every 12 (twelve) hours. Patient not taking: Reported on 12/05/2023 09/11/23   Lovorn, Megan, MD  Oxycodone  HCl 10 MG TABS Take 1 tablet (10 mg total) by mouth every 4 (four) hours as  needed. Patient not taking: Reported on 12/05/2023 09/11/23   Lovorn, Megan, MD  predniSONE  (DELTASONE ) 1 MG tablet Take 3 tablets (3 mg total) by mouth daily with breakfast. (Along with a 5mg  tablet) 12/13/23   Cheryl Waddell HERO, PA-C  predniSONE  (DELTASONE ) 5 MG tablet TAKE 1 TABLET(5 MG) BY MOUTH DAILY WITH BREAKFAST 12/13/23   Cheryl Waddell HERO, PA-C  traZODone  (DESYREL ) 50 MG tablet TAKE 1/2 TO 1 TABLET(25 TO 50 MG) BY MOUTH AT BEDTIME AS NEEDED FOR SLEEP Patient not taking: Reported on 12/05/2023 05/06/23   Thedora Garnette HERO, MD  zoledronic  acid (RECLAST ) 5 MG/100ML SOLN injection Inject 5 mg into the vein once. 09/11/2022 and 10/08/2023    Dolphus Reiter, MD    Allergies: Tape and Celebrex [celecoxib]    Review of Systems  Updated Vital Signs BP (!) 114/53   Pulse 94   Temp ROLLEN)  102.7 F (39.3 C) (Oral)   Resp 17   Ht 1.727 m (5' 8)   Wt 62.1 kg   SpO2 94%   BMI 20.83 kg/m   Physical Exam Vitals and nursing note reviewed.  Constitutional:      General: He is not in acute distress.    Appearance: He is well-developed.  HENT:     Head: Normocephalic and atraumatic.  Eyes:     Conjunctiva/sclera: Conjunctivae normal.  Cardiovascular:     Rate and Rhythm: Regular rhythm. Tachycardia present.  Pulmonary:     Effort: Tachypnea and accessory muscle usage present.     Breath sounds: No stridor.  Abdominal:     General: There is no distension.  Skin:    General: Skin is warm and dry.     Comments: Diaphoretic  Neurological:     Mental Status: He is alert and oriented to person, place, and time.     (all labs ordered are listed, but only abnormal results are displayed) Labs Reviewed  RESP PANEL BY RT-PCR (RSV, FLU A&B, COVID)  [id number]  CULTURE, BLOOD (ROUTINE X 2)  CULTURE, BLOOD (ROUTINE X 2)  URINALYSIS, ROUTINE W REFLEX MICROSCOPIC  COMPREHENSIVE METABOLIC PANEL WITH GFR  CBC WITH DIFFERENTIAL/PLATELET  PROTIME-INR  PRO BRAIN NATRIURETIC PEPTIDE  I-STAT CG4 LACTIC ACID, ED     EKG: None  Radiology: DG Chest 2 View Result Date: 12/13/2023 CLINICAL DATA:  Hypoxia and fever EXAM: CHEST - 2 VIEW COMPARISON:  Chest radiograph dated 10/02/2023 FINDINGS: Normal lung volumes. Confluent right upper lobe opacity. No pleural effusion or pneumothorax. Enlarged cardiomediastinal silhouette is likely projectional. No acute osseous abnormality. IMPRESSION: Confluent right upper lobe opacity, suspicious for pneumonia. Electronically Signed   By: Limin  Xu M.D.   On: 12/13/2023 12:48     Procedures   Medications Ordered in the ED  lactated ringers  infusion (has no administration in time range)  lactated ringers  bolus 1,000 mL (has no administration in time range)  cefTRIAXone  (ROCEPHIN ) 2 g in sodium chloride  0.9 % 100 mL IVPB (has no administration in time range)  azithromycin  (ZITHROMAX ) 500 mg in sodium chloride  0.9 % 250 mL IVPB (has no administration in time range)  acetaminophen  (TYLENOL ) tablet 1,000 mg (has no administration in time range)  acetaminophen  (TYLENOL ) tablet 1,000 mg (1,000 mg Oral Given 12/13/23 1220)                                    Medical Decision Making Adult male presents from preop with concerns of fever, cough, signs and symptoms consistent with pneumonia/sepsis. Mentation is appropriate, blood pressure is initially reassuring, but after review the periorbital x-ray, concern for right-sided opacification, patient started appropriate antibiotics, continued monitoring, labs sent.   Amount and/or Complexity of Data Reviewed Independent Historian: guardian External Data Reviewed: notes. Labs: ordered. Decision-making details documented in ED Course. Radiology: independent interpretation performed. Decision-making details documented in ED Course. ECG/medicine tests: independent interpretation performed. Decision-making details documented in ED Course.  Risk OTC drugs. Prescription drug management. Decision regarding  hospitalization. Diagnosis or treatment significantly limited by social determinants of health.  Pulse ox 94% 2 L nasal cannula abnormal cardiac 95 sinus normal   Patient with signs and symptoms consistent with sepsis, likely due to pneumonia, patient improved here with initiation of antibiotics, supplemental oxygen, required admission due to sepsis.  CRITICAL CARE Performed by: Lamar Salen Total critical care time:  35 minutes Critical care time was exclusive of separately billable procedures and treating other patients. Critical care was necessary to treat or prevent imminent or life-threatening deterioration. Critical care was time spent personally by me on the following activities: development of treatment plan with patient and/or surrogate as well as nursing, discussions with consultants, evaluation of patient's response to treatment, examination of patient, obtaining history from patient or surrogate, ordering and performing treatments and interventions, ordering and review of laboratory studies, ordering and review of radiographic studies, pulse oximetry and re-evaluation of patient's condition.  Final diagnoses:    Sepsis, due to unspecified organism, unspecified whether acute organ dysfunction present [facility])    ED Discharge Orders     None          Garrick Charleston, MD 12/13/23 1609

## 2023-12-13 NOTE — Telephone Encounter (Signed)
 Last Fill: 09/25/2023  Next Visit: 12/30/2023  Last Visit: 09/25/2023  Ik:Empfjmb osteoarthritis of both hands   Current Dose per office note on 09/25/2023: Since reducing the dose of prednisone  to 5 mg daily his morning stiffness has been lasting 4 to 6 hours. He plans on increasing prednisone  back to 8 mg daily as prescribed.    Okay to refill Prednisone ?

## 2023-12-13 NOTE — ED Triage Notes (Signed)
 Pt brought in from short stay. They reported pt felt warm and had a temp of 102.7. Pt given tylenol  and xray. Xray showed pneumonia.

## 2023-12-13 NOTE — Interval H&P Note (Signed)
 History and Physical Interval Note:  12/13/2023 10:49 AM  Brandon [name]  has presented today for surgery, with the diagnosis of Primary osteoarthritis, left shoulder.  The various methods of treatment have been discussed with the patient and family. After consideration of risks, benefits and other options for treatment, the patient has consented to  Procedure(s): ARTHROPLASTY, SHOULDER, TOTAL, REVERSE (Left) as a surgical intervention.  The patient's history has been reviewed, patient examined, no change in status, stable for surgery.  I have reviewed the patient's chart and labs.  Questions were answered to the patient's satisfaction.     [name]

## 2023-12-13 NOTE — Sepsis Progress Note (Signed)
 Sepsis protocol monitored by eLink

## 2023-12-13 NOTE — Progress Notes (Signed)
 Presented to preop short of breath, saturating mid-80s on room air. Able to speak a few words at a time without stopping for air, coughing up white phlegm. Also extremely sleepy, falling asleep mid-conversation multiple times- per pt took valium this morning (not on med list, so I am unsure what the dose was and he also does not know dosage). RN checked in patient and consistently febrile >101F. CXR not formally read yet but pretty clear RML opacity seen. Upon chart review, does not appear that anyone treating pt for COPD exacerbation over the last 30days got a chest xray, despite needing escalation of steroids and Abx. D/w pt and daughter would recommend transfer to ED for possible admission to hospital and IV abx.

## 2023-12-13 NOTE — H&P (Signed)
 History and Physical  Brandon [name] FMW:[id number] DOB: 01-01-1941 DOA: 12/13/2023  PCP: Thedora Garnette HERO, MD   Chief Complaint: Fever, hypoxia  HPI: Brandon [name] is a 83 y.o. male with medical history significant for osteoarthritis, aortic stenosis status post TAVR, former tobacco abuser with history of tonsillar cancer, heart failure with preserved EF who presented for elective left shoulder arthroplasty today but was found to be hypoxic and febrile being admitted to the hospitalist service with sepsis due to community-acquired pneumonia.  Per my discussion with the patient as well as review of the medical record, he does have a history of polymyalgia rheumatica and is on chronic steroids.  He was diagnosed with bronchitis and took a course of doxycycline  as an outpatient in early October, and on 10/21 due to persistent pulmonary symptoms his oral doxycycline  course was extended, he was placed on 20 mg prednisone  daily for 10 days.  Patient tells me that he initially got better but since 10/21 he feels like his pulmonary symptoms have remained stable.  He endorses some mild dyspnea with exertion, and a cough productive of small amounts of white sputum.  He denies any chest pain, fevers or chills at home.  He presented for surgery early this morning, he was noted to be somewhat lethargic, febrile, hypoxic to the mid 80s on room air.  He was brought to the ER for evaluation, where workup as detailed below shows evidence of sepsis due to community-acquired pneumonia.  Review of Systems: Please see HPI for pertinent positives and negatives. A complete 10 system review of systems are otherwise negative.  Past Medical History:  Diagnosis Date   Abdominal aneurysm    Arthritis    all over (07/19/2016)   BPH (benign prostatic hypertrophy)    CAD (coronary artery disease)    Chicken pox    Chronic lower back pain    s/p surgical fusion   COPD (chronic obstructive pulmonary disease) (HCC)    Depression     Diverticulosis    Esophageal stricture    GERD (gastroesophageal reflux disease)    Heart murmur    Hepatitis B 1984   Hiatal hernia    History of radiation therapy 01/16/18- 03/05/18   Left Tonsil, 66 Gy in 33 fractions to high risk nodal echelons.    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 Wonda and Dr Lucas    Severe aortic stenosis    Stroke [facility])    TIA - 20 years ago   TIA (transient ischemic attack) 1990s   hx   tonsillar ca dx'd 11/2017   Tubular adenoma of colon 2009   Past Surgical History:  Procedure Laterality Date   ABDOMINAL AORTIC ENDOVASCULAR STENT GRAFT N/A 03/03/2021   Procedure: ABDOMINAL AORTIC ENDOVASCULAR STENT GRAFT;  Surgeon: Sheree Penne Bruckner, MD;  Location: [facility] OR;  Service: Vascular;  Laterality: N/A;   ABDOMINAL AORTOGRAM W/LOWER EXTREMITY N/A 04/10/2021   Procedure: ABDOMINAL AORTOGRAM W/LOWER EXTREMITY;  Surgeon: Sheree Penne Bruckner, MD;  Location: [facility] INVASIVE CV LAB;  Service: Cardiovascular;  Laterality: N/A;   BACK SURGERY     CARDIAC CATHETERIZATION     CATARACT EXTRACTION W/ INTRAOCULAR LENS  IMPLANT, BILATERAL Bilateral 01/2012 - 02/2012   CORONARY ATHERECTOMY N/A 04/27/2020   Procedure: CORONARY ATHERECTOMY;  Surgeon:  Wonda Sharper, MD;  Location: [facility] INVASIVE CV LAB;  Service: Cardiovascular;  Laterality: N/A;   CORONARY IMAGING/OCT N/A 04/27/2020   Procedure: INTRAVASCULAR IMAGING/OCT;  Surgeon: Wonda Sharper, MD;  Location: [facility] INVASIVE CV LAB;  Service: Cardiovascular;  Laterality: N/A;   CORONARY PRESSURE/FFR STUDY N/A 04/27/2020   Procedure: INTRAVASCULAR PRESSURE WIRE/FFR STUDY;  Surgeon: Wonda Sharper, MD;  Location: [facility] INVASIVE CV LAB;  Service: Cardiovascular;  Laterality: N/A;   CORONARY STENT INTERVENTION N/A 04/27/2020    Procedure: CORONARY STENT INTERVENTION;  Surgeon: Wonda Sharper, MD;  Location: [facility] INVASIVE CV LAB;  Service: Cardiovascular;  Laterality: N/A;   DIRECT LARYNGOSCOPY Left 10/09/2017   Procedure: DIRECT LARYNGOSCOPY WITH BOPSY;  Surgeon: Arlana Arnt, MD;  Location: [facility];  Service: ENT;  Laterality: Left;   ESOPHAGOGASTRODUODENOSCOPY (EGD) WITH ESOPHAGEAL DILATION     couple times (07/19/2016)   ESOPHAGOSCOPY Left 10/09/2017   Procedure: ESOPHAGOSCOPY;  Surgeon: Arlana Arnt, MD;  Location: [facility];  Service: ENT;  Laterality: Left;   IR GASTROSTOMY TUBE MOD SED  01/08/2018   IR THORACENTESIS ASP PLEURAL SPACE W/IMG GUIDE  07/19/2016   LAPAROSCOPIC CHOLECYSTECTOMY  1984   LUMBAR DISC SURGERY  05/1996   L4-5; Dr. Alix ILES LAMINECTOMY/DECOMPRESSION MICRODISCECTOMY  10/2002   L3-4. Dr. Alix   MULTIPLE TOOTH EXTRACTIONS  1980s   PARTIAL GLOSSECTOMY  12/02/2017   Dr. Lauralee- [facility]   pharyngoplasty for closure of tingue base defect  12/02/2017   Dr. Lauralee- [facility]   PICC LINE INSERTION  07/15/2016   POSTERIOR LUMBAR FUSION  09/1996   Ray cage, L4-5 Dr. Mora   PROSTATE BIOPSY  ~ 2017   radical tonsillectomy Left 12/02/2017   Dr. Lauralee at [facility]   RIGHT HEART CATH AND CORONARY ANGIOGRAPHY N/A 03/31/2020   Procedure: RIGHT HEART CATH AND CORONARY ANGIOGRAPHY;  Surgeon: Rolan Ezra RAMAN, MD;  Location: [facility] INVASIVE CV LAB;  Service: Cardiovascular;  Laterality: N/A;   RIGID BRONCHOSCOPY Left 10/09/2017   Procedure: RIGID BRONCHOSCOPY;  Surgeon: Arlana Arnt, MD;  Location: [facility];  Service: ENT;  Laterality: Left;   TEE WITHOUT CARDIOVERSION N/A 06/07/2020   Procedure: TRANSESOPHAGEAL ECHOCARDIOGRAM (TEE);  Surgeon: Wonda Sharper, MD;  Location: [facility] OR;  Service: Open Heart Surgery;  Laterality: N/A;   TONSILLECTOMY     TOTAL HIP ARTHROPLASTY Left 12/04/2020   Procedure: TOTAL HIP ARTHROPLASTY ANTERIOR  APPROACH;  Surgeon: Fidel Rogue, MD;  Location: [facility] ORS;  Service: Orthopedics;  Laterality: Left;   TOTAL HIP ARTHROPLASTY Left 11/2020   TRACHEOSTOMY  12/02/2017   Dr. [facility]alee- [facility]   ULTRASOUND GUIDANCE FOR VASCULAR ACCESS Right 06/07/2020   Procedure: ULTRASOUND GUIDANCE FOR VASCULAR ACCESS;  Surgeon: Wonda Sharper, MD;  Location: [facility] OR;  Service: Open Heart Surgery;  Laterality: Right;   ULTRASOUND GUIDANCE FOR VASCULAR ACCESS Bilateral 03/03/2021   Procedure: ULTRASOUND GUIDANCE FOR VASCULAR ACCESS, BILATERAL FEMORAL ARTERIES;  Surgeon: Sheree Penne Bruckner, MD;  Location: [facility] OR;  Service: Vascular;  Laterality: Bilateral;   VASCULAR SURGERY     Social History:  reports that he quit smoking about 6 years ago. His smoking use included cigarettes. He started smoking about 56 years ago. He has a 75 pack-year smoking history. He has never been exposed to tobacco smoke. He has never used smokeless tobacco. He reports that he does not currently use alcohol . He reports current drug use. Drugs: Morphine  and Oxycodone .  Allergies  Allergen Reactions   Tape Other (See Comments)  Tears skin. Prefers paper tape, Please   Celebrex [Celecoxib] Hives and Itching    Family History  Problem Relation Age of Onset   Heart disease Father 34       Living   Coronary artery disease Father        CABG   Alzheimer's disease Mother 32       Deceased   Arthritis Mother    Aneurysm Brother    Stomach cancer Maternal Uncle    Brain cancer Maternal Aunt        x2   Obesity Daughter        Had Bypass Sx     Prior to Admission medications   Medication Sig Start Date End Date Taking? Authorizing Provider  acetaminophen  (TYLENOL ) 500 MG tablet Take 1,000 mg by mouth every 8 (eight) hours as needed for mild pain or headache.   Yes [provider]  albuterol  (VENTOLIN  HFA) 108 (90 Base) MCG/ACT inhaler Inhale 2 puffs into the lungs every 6 (six) hours as needed for wheezing or shortness  of breath. 11/11/23  Yes Nche, Roselie Rockford, NP  allopurinol  (ZYLOPRIM ) 100 MG tablet TAKE 2 TABLETS(200 MG) BY MOUTH DAILY 11/29/23  Yes Deveshwar, Maya, MD  amLODipine  (NORVASC ) 10 MG tablet Take 1 tablet (10 mg total) by mouth daily. 06/12/23  Yes Milford, Harlene HERO, FNP  atorvastatin  (LIPITOR ) 80 MG tablet Take 1 tablet (80 mg total) by mouth daily. 06/12/23  Yes Milford, Harlene HERO, FNP  carvedilol  (COREG ) 12.5 MG tablet Take 1 tablet (12.5 mg total) by mouth 2 (two) times daily. 06/12/23  Yes Milford, Harlene HERO, FNP  cyanocobalamin  (VITAMIN B12) 1000 MCG tablet Take 1 tablet (1,000 mcg total) by mouth daily. 02/11/23  Yes Thedora Garnette HERO, MD  ezetimibe  (ZETIA ) 10 MG tablet TAKE 1 TABLET ONCE DAILY 06/12/23  Yes [place], [place], FNP  feeding supplement (BOOST HIGH PROTEIN) LIQD Take 1 Container by mouth in the morning.   Yes [provider]  FLUoxetine  (PROZAC ) 20 MG capsule Take 1 capsule (20 mg total) by mouth daily. 02/11/23  Yes RuddGarnette HERO, MD  fluticasone  (FLONASE ) 50 MCG/ACT nasal spray SHAKE LIQUID AND USE 2 SPRAYS IN EACH NOSTRIL DAILY AS NEEDED FOR RHINITIS OR ALLERGIES 11/14/20  Yes Thedora Garnette HERO, MD  gabapentin  (NEURONTIN ) 300 MG capsule TAKE 1 CAPSULE(300 MG) BY MOUTH TWICE DAILY 10/10/23  Yes Lovorn, Megan, MD  guaiFENesin  (MUCINEX ) 600 MG 12 hr tablet Take 600 mg by mouth daily.   Yes [provider]  hydrocortisone  cream 0.5 % Apply 1 Application topically as needed for itching.   Yes [provider]  hydrOXYzine  (ATARAX ) 10 MG tablet Take 1-2 tablets (10-20 mg total) by mouth at bedtime as needed for itching (or sleep). 01/09/23  Yes Thedora Garnette HERO, MD  ipratropium (ATROVENT ) 0.02 % nebulizer solution Use 1 vial (0.5 mg total) by nebulization every 6 (six) hours as needed for wheezing or shortness of breath. 08/21/21  Yes Sheikh, Omair Latif, DO  morphine  (MS CONTIN ) 15 MG 12 hr tablet Take 1 tablet (15 mg total) by mouth every 12 (twelve) hours. Fill today-  8/6 11/06/23  Yes Debby Fidela CROME, NP  naloxone  (NARCAN ) nasal spray 4 mg/0.1 mL For possible accidentally overdose. 02/12/22  Yes Lovorn, Megan, MD  Oxycodone  HCl 10 MG TABS Take 1 tablet (10 mg total) by mouth every 4 (four) hours as needed. 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 07/08/23 12/05/23 Yes Thedora Garnette HERO, MD  potassium chloride  (KLOR-CON ) 10 MEQ tablet TAKE 1 TABLET(10 MEQ) BY MOUTH FOUR TIMES DAILY 10/11/23  Yes Arnold Line, Harlene HERO, FNP  predniSONE  (DELTASONE ) 20 MG tablet Take 1 tablet (20 mg total) by mouth daily with breakfast. 11/26/23  Yes Thedora Garnette HERO, MD  tamsulosin  (FLOMAX ) 0.4 MG CAPS capsule Take 1 capsule (0.4 mg total) by mouth daily. 10/01/23  Yes Thedora Garnette HERO, MD  Tiotropium Bromide  Monohydrate (SPIRIVA  RESPIMAT) 2.5 MCG/ACT AERS Inhale 2 puffs into the lungs daily. Patient taking differently: Inhale 2 puffs into the lungs daily as needed (shortness of breath). 11/11/23  Yes Nche, Roselie Rockford, NP  triamcinolone  cream (KENALOG ) 0.1 % Apply 1 Application topically 2 (two) times daily as needed (to itchy sites).   Yes [provider]  trolamine salicylate (ASPERCREME) 10 % cream Apply 1 Application topically 2 (two) times daily as needed (arthritis pain).   Yes [provider]  aspirin  EC 81 MG tablet Take 81 mg by mouth at bedtime. Swallow whole.    [provider]  benzonatate (TESSALON) 100 MG capsule Take 1 capsule (100 mg total) by mouth 3 (three) times daily as needed. Patient not taking: Reported on 12/05/2023 11/11/23   Nche, Roselie Rockford, NP  dorzolamide -timolol  (COSOPT ) 2-0.5 % ophthalmic solution Place 1 drop into the left eye 2 (two) times daily. Patient not taking: Reported on 12/05/2023 03/16/23   Ruthell Lonni FALCON, PA-C  doxycycline  (VIBRA -TABS) 100 MG tablet Take 1 tablet (100 mg total) by mouth 2 (two)  times daily. Patient not taking: Reported on 12/05/2023 11/26/23   Thedora Garnette HERO, MD  ferrous sulfate  325 (65 FE) MG EC tablet Take 1 tablet (325 mg total) by mouth daily with breakfast. Patient not taking: Reported on 12/05/2023 01/20/22 11/26/23  Cindy Garnette POUR, MD  furosemide  (LASIX ) 20 MG tablet Take 1 tablet (20 mg total) by mouth daily as needed for fluid or edema. Patient not taking: Reported on 12/05/2023 06/12/23   Glena Harlene HERO, FNP  mirtazapine  (REMERON ) 7.5 MG tablet TAKE 1 TABLET(7.5 MG) BY MOUTH AT BEDTIME Patient not taking: Reported on 12/05/2023 11/18/23   Thedora Garnette HERO, MD  morphine  (MS CONTIN ) 15 MG 12 hr tablet Take 1 tablet (15 mg total) by mouth every 12 (twelve) hours. Patient not taking: Reported on 12/05/2023 09/11/23   Lovorn, Megan, MD  Oxycodone  HCl 10 MG TABS Take 1 tablet (10 mg total) by mouth every 4 (four) hours as needed. Patient not taking: Reported on 12/05/2023 09/11/23   Lovorn, Megan, MD  predniSONE  (DELTASONE ) 1 MG tablet Take 3 tablets (3 mg total) by mouth daily with breakfast. (Along with a 5mg  tablet) 12/13/23   Cheryl Waddell HERO, PA-C  predniSONE  (DELTASONE ) 5 MG tablet TAKE 1 TABLET(5 MG) BY MOUTH DAILY WITH BREAKFAST 12/13/23   Cheryl Waddell HERO, PA-C  traZODone  (DESYREL ) 50 MG tablet TAKE 1/2 TO 1 TABLET(25 TO 50 MG) BY MOUTH AT BEDTIME AS NEEDED FOR SLEEP Patient not taking: Reported on 12/05/2023 05/06/23   Thedora Garnette HERO, MD  zoledronic  acid (RECLAST ) 5 MG/100ML SOLN injection Inject 5 mg into the vein once. 09/11/2022 and 10/08/2023    Dolphus Reiter, MD    Physical Exam: BP (!) 114/53   Pulse 94   Temp 98.8 F (37.1 C) (Oral)   Resp 17   Ht 5' 8 (1.727 m)  Wt 62.1 kg   SpO2 94%   BMI 20.83 kg/m  General:  Alert, oriented, calm, in no acute distress, slightly lethargic but easily arousable and good historian.  Currently resting on 2 L nasal cannula oxygen, saturating 94%. Cardiovascular: RRR, no murmurs or rubs, no peripheral edema   Respiratory: clear to auscultation bilaterally, no wheezes, no crackles  Abdomen: soft, nontender, nondistended, normal bowel tones heard  Skin: dry, no rashes  Musculoskeletal: no joint effusions, normal range of motion  Psychiatric: appropriate affect, normal speech  Neurologic: extraocular muscles intact, clear speech, moving all extremities with intact sensorium         Labs on Admission:  Basic Metabolic Panel: Recent Labs  Lab 12/13/23 1320  NA 135  K 3.6  CL 101  CO2 25  GLUCOSE 111*  BUN 13  CREATININE 0.72  CALCIUM  8.7*   Liver Function Tests: Recent Labs  Lab 12/13/23 1320  AST 31  ALT 18  ALKPHOS 126  BILITOT 0.9  PROT 6.0*  ALBUMIN  3.4*   No results for input(s): LIPASE, AMYLASE in the last 168 hours. No results for input(s): AMMONIA in the last 168 hours. CBC: Recent Labs  Lab 12/13/23 1320  WBC 14.0*  NEUTROABS 12.5*  HGB 11.6*  HCT 38.1*  MCV 89.4  PLT 72*   Cardiac Enzymes: No results for input(s): CKTOTAL, CKMB, CKMBINDEX, TROPONINI in the last 168 hours. BNP (last 3 results) No results for input(s): BNP in the last 8760 hours.  ProBNP (last 3 results) Recent Labs    11/11/23 1401  PROBNP 177.0*    CBG: No results for input(s): GLUCAP in the last 168 hours.  Radiological Exams on Admission: DG Chest 2 View Result Date: 12/13/2023 CLINICAL DATA:  Hypoxia and fever EXAM: CHEST - 2 VIEW COMPARISON:  Chest radiograph dated 10/02/2023 FINDINGS: Normal lung volumes. Confluent right upper lobe opacity. No pleural effusion or pneumothorax. Enlarged cardiomediastinal silhouette is likely projectional. No acute osseous abnormality. IMPRESSION: Confluent right upper lobe opacity, suspicious for pneumonia. Electronically Signed   By: Limin  Xu M.D.   On: 12/13/2023 12:48   Assessment/Plan Brandon [name] is a 83 y.o. male with medical history significant for osteoarthritis, aortic stenosis status post TAVR, former tobacco abuser  with history of tonsillar cancer, heart failure with preserved EF who presented for elective left shoulder arthroplasty today but was found to be hypoxic and febrile being admitted to the hospitalist service with sepsis due to community-acquired pneumonia.  Sepsis-meeting criteria with tachycardia, fever, leukocytosis.  No evidence of endorgan dysfunction.  Lactic acid is normal, patient is hemodynamically stable with normal blood pressure. -Inpatient admission -Follow-up blood culture -Empiric IV azithromycin  and IV Rocephin   Hypertension-amlodipine   Hyperlipidemia-Lipitor , Zetia   Heart failure with preserved EF-patient appears euvolemic, continue home Coreg .  Takes Lasix  at home only as needed.  Received 1 L LR bolus and was placed on maintenance fluids at 150 cc/h.  Given hemodynamic stability, normal lactate and history of heart failure, will discontinue maintenance fluids.  Depression-Prozac   Neuropathy and chronic pain-continue home MS Contin , gabapentin   Polymyalgia rheumatica-patient is chronically on fluctuating doses of prednisone , he completed steroid burst prescribed by his PCP, tells me he was most recently taking 8 mg of prednisone  daily.  Ran out of his prednisone  a couple of days ago. -Continue prednisone  8 mg p.o. daily -Monitor closely for signs of adrenal insufficiency, in which case he would benefit from stress dose steroids  BPH-Flomax   DVT prophylaxis: Lovenox   Code Status: Full Code  Consults called: None  Admission status: The appropriate patient status for this patient is INPATIENT. Inpatient status is judged to be reasonable and necessary in order to provide the required intensity of service to ensure the patient's safety. The patient's presenting symptoms, physical exam findings, and initial radiographic and laboratory data in the context of their chronic comorbidities is felt to place them at high risk for further clinical deterioration. Furthermore, it is  not anticipated that the patient will be medically stable for discharge from the hospital within 2 midnights of admission.    I certify that at the point of admission it is my clinical judgment that the patient will require inpatient hospital care spanning beyond 2 midnights from the point of admission due to high intensity of service, high risk for further deterioration and high frequency of surveillance required  Time spent: 53 minutes  Remon Quinto CHRISTELLA Gail MD [facility] Pager [phone]  If 7PM-7AM, please contact night-coverage [email or web address] Password [facility]  12/13/2023, 2:58 PM

## 2023-12-14 ENCOUNTER — Other Ambulatory Visit: Payer: Self-pay

## 2023-12-14 ENCOUNTER — Other Ambulatory Visit (HOSPITAL_COMMUNITY): Payer: Self-pay

## 2023-12-14 DIAGNOSIS — A419 Sepsis, unspecified organism: Secondary | ICD-10-CM | POA: Diagnosis not present

## 2023-12-14 DIAGNOSIS — M353 Polymyalgia rheumatica: Secondary | ICD-10-CM

## 2023-12-14 DIAGNOSIS — C090 Malignant neoplasm of tonsillar fossa: Secondary | ICD-10-CM

## 2023-12-14 DIAGNOSIS — J189 Pneumonia, unspecified organism: Secondary | ICD-10-CM | POA: Diagnosis not present

## 2023-12-14 LAB — CBC
HCT: 35.9 % — ABNORMAL LOW (ref 39.0–52.0)
Hemoglobin: 10.5 g/dL — ABNORMAL LOW (ref 13.0–17.0)
MCH: 26.7 pg (ref 26.0–34.0)
MCHC: 29.2 g/dL — ABNORMAL LOW (ref 30.0–36.0)
MCV: 91.3 fL (ref 80.0–100.0)
Platelets: 69 K/uL — ABNORMAL LOW (ref 150–400)
RBC: 3.93 MIL/uL — ABNORMAL LOW (ref 4.22–5.81)
RDW: 15.2 % (ref 11.5–15.5)
WBC: 11.6 K/uL — ABNORMAL HIGH (ref 4.0–10.5)
nRBC: 0 % (ref 0.0–0.2)

## 2023-12-14 LAB — BASIC METABOLIC PANEL WITH GFR
Anion gap: 10 (ref 5–15)
BUN: 15 mg/dL (ref 8–23)
CO2: 25 mmol/L (ref 22–32)
Calcium: 8.2 mg/dL — ABNORMAL LOW (ref 8.9–10.3)
Chloride: 104 mmol/L (ref 98–111)
Creatinine, Ser: 0.61 mg/dL (ref 0.61–1.24)
GFR, Estimated: >60 mL/min (ref >60)
Glucose, Bld: 99 mg/dL (ref 70–99)
Potassium: 3.8 mmol/L (ref 3.5–5.1)
Sodium: 140 mmol/L (ref 135–145)

## 2023-12-14 MED ORDER — PREDNISONE 1 MG PO TABS
3.0000 mg | ORAL_TABLET | Freq: Every day | ORAL | 0 refills | Status: AC
  Filled 2023-12-14: qty 90, 30d supply, fill #0

## 2023-12-14 MED ORDER — AMOXICILLIN-POT CLAVULANATE 875-125 MG PO TABS
1.0000 | ORAL_TABLET | Freq: Two times a day (BID) | ORAL | 0 refills | Status: AC
  Filled 2023-12-14: qty 12, 6d supply, fill #0

## 2023-12-14 MED ORDER — DOXYCYCLINE HYCLATE 100 MG PO TABS
100.0000 mg | ORAL_TABLET | Freq: Two times a day (BID) | ORAL | Status: DC
  Administered 2023-12-14: 100 mg via ORAL
  Filled 2023-12-14: qty 1

## 2023-12-14 MED ORDER — DOXYCYCLINE HYCLATE 100 MG PO TABS
100.0000 mg | ORAL_TABLET | Freq: Two times a day (BID) | ORAL | 0 refills | Status: AC
  Filled 2023-12-14: qty 12, 6d supply, fill #0

## 2023-12-14 MED ORDER — AMOXICILLIN-POT CLAVULANATE 875-125 MG PO TABS
1.0000 | ORAL_TABLET | Freq: Two times a day (BID) | ORAL | Status: DC
  Administered 2023-12-14: 1 via ORAL
  Filled 2023-12-14: qty 1

## 2023-12-14 MED ORDER — PREDNISONE 5 MG PO TABS
5.0000 mg | ORAL_TABLET | Freq: Every day | ORAL | 0 refills | Status: AC
  Filled 2023-12-14: qty 30, 30d supply, fill #0

## 2023-12-14 NOTE — Discharge Summary (Signed)
 Physician Discharge Summary   Brandon [name] FMW:[id number] DOB: 1940-12-27 DOA: 12/13/2023  PCP: Thedora Garnette HERO, MD  Admit date: 12/13/2023 Discharge date: 12/14/2023  Admitted From: Home Disposition:  Home Discharging physician: Alm Apo, MD Barriers to discharge: none  Recommendations at discharge: Repeat CXR in 4-6 weeks for resolution    Discharge Condition: stable CODE STATUS: Full  Diet recommendation:  Diet Orders (From admission, onward)     Start     Ordered   12/14/23 0000  Diet general        12/14/23 1057   12/13/23 1443  Diet regular Room service appropriate? Yes; Fluid consistency: Thin  Diet effective now       Question Answer Comment  Room service appropriate? Yes   Fluid consistency: Thin      12/13/23 1443            Hospital Course: Brandon [name] is an 83 y.o. male with medical history significant for osteoarthritis, aortic stenosis status post TAVR, former tobacco abuser with history of tonsillar cancer, heart failure with preserved EF who presented for elective left shoulder arthroplasty on 11/7 but was found to be hypoxic and febrile being admitted to the hospitalist service with sepsis due to community-acquired pneumonia.   He also has known history of PMR and is on chronic prednisone , currently 8 mg daily. He has also had recent diagnosis of bronchitis treated with course of doxycycline  early October and repeat/extended course of doxycycline  on 10/21 due to persistent symptoms.  CXR on admission showed moderate-sized right upper lobe opacity concerning for lobar pneumonia.  He was started on Rocephin  and azithromycin .  Oxygen also started due to hypoxia.  He had good clinical improvement overnight and was able to be weaned off oxygen and performed walk test with lowest oxygen saturation 92% while ambulating. He was transitioned to Augmentin  and doxycycline  to complete total of 7-day course at discharge.  Assessment and Plan: * Sepsis due to  pneumonia (HCC) - Febrile, tachypnea,  leukocytosis.  Pulmonary source -Right upper lobe moderate-sized pneumonia noted on CXR -Clinically improved overnight.  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  He is s/p transcervical / transhyoid resection with left neck dissection on 12/02/17.  Pathology with a 4.0 cm tumor, 3.3 cm depth of invasion, positive margin, +PNI, +LVI, 0/32 nodes.  He underwent postop RT, completed 03/05/18. He underwent release of the tongue from gingiva on 07/07/18   The patient's acute and chronic medical conditions were treated accordingly. On day of discharge, patient was felt deemed stable for discharge. Patient/family member advised to call PCP or come back to ER if needed.   Principal Diagnosis: Sepsis due to pneumonia [facility])  Discharge Diagnoses: Active Hospital Problems   Diagnosis Date Noted   Sepsis due to pneumonia (HCC) 12/13/2023    Priority: 1.   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    Resolved Hospital Problems  No resolved problems to display.     Discharge Instructions     Diet general   Complete  by: As directed    Increase activity slowly   Complete by: As directed       Allergies as of 12/14/2023       Reactions   Tape Other (See Comments)   Tears skin. Prefers paper tape, Please   Celebrex [celecoxib] Hives, Itching        Medication List     TAKE these medications    acetaminophen  500 MG tablet Commonly known  as: TYLENOL  Take 1,000 mg by mouth every 8 (eight) hours as needed for mild pain or headache.   albuterol  108 (90 Base) MCG/ACT inhaler Commonly known as: VENTOLIN  HFA Inhale 2 puffs into the lungs every 6 (six) hours as needed for wheezing or shortness of breath.   allopurinol  100 MG tablet Commonly known as: ZYLOPRIM  TAKE 2 TABLETS(200 MG) BY MOUTH DAILY   amLODipine  10 MG tablet Commonly known as: NORVASC  Take 1 tablet (10 mg total) by mouth daily.   amoxicillin -clavulanate 875-125 MG tablet Commonly known as: AUGMENTIN  Take 1 tablet by mouth every 12 (twelve) hours for 6 days.   aspirin  EC 81 MG tablet Take 81 mg by mouth at bedtime. Swallow whole.   atorvastatin  80 MG tablet Commonly known as: LIPITOR  Take 1 tablet (80 mg total) by mouth daily.   benzonatate 100 MG capsule Commonly known as: TESSALON Take 1 capsule (100 mg total) by mouth 3 (three) times daily as needed.   carvedilol  12.5 MG tablet Commonly known as: COREG  Take 1 tablet (12.5 mg total) by mouth 2 (two) times daily.   cyanocobalamin  1000 MCG tablet Commonly known as: VITAMIN B12 Take 1 tablet (1,000 mcg total) by mouth daily.   dorzolamide -timolol  2-0.5 % ophthalmic solution Commonly known as: Cosopt  Place 1 drop into the left eye 2 (two) times daily.   doxycycline  100 MG tablet Commonly known as: VIBRA -TABS Take 1 tablet (100 mg total) by mouth every 12 (twelve) hours for 6 days. What changed: when to take this   ezetimibe  10 MG tablet Commonly known as: ZETIA  TAKE 1 TABLET ONCE DAILY   feeding supplement Liqd Take 1 Container by mouth in the morning.   ferrous sulfate  325 (65 FE) MG EC tablet Take 1 tablet (325 mg total) by mouth daily with breakfast.   FLUoxetine  20 MG capsule Commonly known as: PROZAC  Take 1 capsule (20 mg total) by mouth daily.   fluticasone  50 MCG/ACT nasal spray Commonly known as: FLONASE  SHAKE LIQUID AND USE 2 SPRAYS IN EACH NOSTRIL DAILY AS NEEDED FOR  RHINITIS OR ALLERGIES   furosemide  20 MG tablet Commonly known as: LASIX  Take 1 tablet (20 mg total) by mouth daily as needed for fluid or edema.   gabapentin  300 MG capsule Commonly known as: NEURONTIN  TAKE 1 CAPSULE(300 MG) BY MOUTH TWICE DAILY   guaiFENesin  600 MG 12 hr tablet Commonly known as: MUCINEX  Take 600 mg by mouth daily.   hydrocortisone  cream 0.5 % Apply 1 Application topically as needed for itching.   hydrOXYzine  10 MG tablet Commonly known as: ATARAX  Take 1-2 tablets (10-20 mg total) by mouth at bedtime as needed for itching (or sleep).   ipratropium 0.02 % nebulizer solution Commonly known as: ATROVENT  Use 1 vial (0.5 mg total) by nebulization every 6 (six) hours as needed for wheezing or shortness of breath.   mirtazapine  7.5 MG tablet Commonly known as: REMERON  TAKE 1 TABLET(7.5 MG) BY MOUTH AT BEDTIME   morphine  15 MG 12 hr tablet Commonly known as: MS Contin  Take 1  tablet (15 mg total) by mouth every 12 (twelve) hours. Fill today- 8/6 What changed: Another medication with the same name was removed. Continue taking this medication, and follow the directions you see here.   naloxone  4 MG/0.1ML Liqd nasal spray kit Commonly known as: NARCAN  For possible accidentally overdose.   Oxycodone  HCl 10 MG Tabs Take 1 tablet (10 mg total) by mouth every 4 (four) hours as needed. 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 Continue taking this medication, and follow the directions you see here.   pantoprazole  40 MG tablet Commonly known as: PROTONIX  Take 1 tablet (40 mg total) by mouth daily.   potassium chloride  10 MEQ tablet Commonly known as: KLOR-CON  TAKE 1 TABLET(10 MEQ) BY MOUTH FOUR TIMES DAILY   predniSONE  1 MG tablet Commonly known as: DELTASONE  Take 3 tablets (3 mg total) by mouth daily with breakfast. (Along with a 5mg  tablet) What changed:  Another medication with the same  name was changed. Make sure you understand how and when to take each. Another medication with the same name was removed. Continue taking this medication, and follow the directions you see here.   predniSONE  5 MG tablet Commonly known as: DELTASONE  Take 1 tablet (5 mg total) by mouth daily with breakfast. What changed:  how much to take how to take this when to take this additional instructions Another medication with the same name was removed. Continue taking this medication, and follow the directions you see here.   Reclast  5 MG/100ML Soln injection Generic drug: zoledronic  acid Inject 5 mg into the vein once. 09/11/2022 and 10/08/2023   Spiriva  Respimat 2.5 MCG/ACT Aers Generic drug: Tiotropium Bromide  Inhale 2 puffs into the lungs daily. What changed:  when to take this reasons to take this   tamsulosin  0.4 MG Caps capsule Commonly known as: FLOMAX  Take 1 capsule (0.4 mg total) by mouth daily.   traZODone  50 MG tablet Commonly known as: DESYREL  TAKE 1/2 TO 1 TABLET(25 TO 50 MG) BY MOUTH AT BEDTIME AS NEEDED FOR SLEEP   triamcinolone  cream 0.1 % Commonly known as: KENALOG  Apply 1 Application topically 2 (two) times daily as needed (to itchy sites).   trolamine salicylate 10 % cream Commonly known as: ASPERCREME Apply 1 Application topically 2 (two) times daily as needed (arthritis pain).        Allergies  Allergen Reactions   Tape Other (See Comments)    Tears skin. Prefers paper tape, Please   Celebrex [Celecoxib] Hives and Itching    Consultations:   Procedures:   Discharge Exam: BP 119/71   Pulse 95   Temp 98.5 F (36.9 C) (Oral)   Resp 18   Ht 5' 8 (1.727 m)   Wt 62.1 kg   SpO2 91%   BMI 20.83 kg/m  Physical Exam Constitutional:      General: He is not in acute distress.    Appearance: Normal appearance.  HENT:     Head: Normocephalic and atraumatic.     Mouth/Throat:     Mouth: Mucous membranes are moist.  Eyes:     Extraocular  Movements: Extraocular movements intact.  Cardiovascular:     Rate and Rhythm: Normal rate and regular rhythm.  Pulmonary:     Effort: Pulmonary effort is normal. No respiratory distress.     Breath sounds: Rhonchi present. No wheezing.  Abdominal:     General: Bowel sounds are normal. There is  no distension.     Palpations: Abdomen is soft.     Tenderness: There is no abdominal tenderness.  Musculoskeletal:        General: Normal range of motion.     Cervical back: Normal range of motion and neck supple.  Skin:    General: Skin is warm and dry.  Neurological:     General: No focal deficit present.     Mental Status: He is alert.  Psychiatric:        Mood and Affect: Mood normal.        Behavior: Behavior normal.      The results of significant diagnostics from this hospitalization (including imaging, microbiology, ancillary and laboratory) are listed below for reference.   Microbiology: Recent Results (from the past 240 hours)  Blood Culture (routine x 2)     Status: None (Preliminary result)   Collection Time: 12/13/23  1:20 PM   Specimen: BLOOD LEFT FOREARM  Result Value Ref Range Status   Specimen Description   Final    BLOOD LEFT FOREARM Performed at [facility], [address] [place], [place] [address]    Special Requests   Final    BOTTLES DRAWN AEROBIC AND ANAEROBIC Blood Culture results may not be optimal due to an inadequate volume of blood received in culture bottles Performed at [facility], [address] [place], [place] [address]    Culture   Final    NO GROWTH < 12 HOURS Performed at [name], [address], [place], [place] [address]    Report Status PENDING  Incomplete  Resp panel by RT-PCR (RSV, Flu A&B, Covid) Anterior Nasal Swab     Status: None   Collection Time: 12/13/23  2:18 PM   Specimen: Anterior Nasal Swab  Result Value Ref Range Status   SARS Coronavirus 2 by RT PCR NEGATIVE NEGATIVE Final     Comment: (NOTE) SARS-CoV-2 target nucleic acids are NOT DETECTED.  The SARS-CoV-2 RNA is generally detectable in upper respiratory specimens during the acute phase of infection. The lowest concentration of SARS-CoV-2 viral copies this assay can detect is 138 copies/mL. A negative result does not preclude SARS-Cov-2 infection and should not be used as the sole basis for treatment or other patient management decisions. A negative result may occur with  improper specimen collection/handling, submission of specimen other than nasopharyngeal swab, presence of viral mutation(s) within the areas targeted by this assay, and inadequate number of viral copies(<138 copies/mL). A negative result must be combined with clinical observations, patient history, and epidemiological information. The expected result is Negative.  Fact Sheet for Patients:  bloggercourse.com  Fact Sheet for Healthcare Providers:  seriousbroker.it  This test is no t yet approved or cleared by the [place]  FDA and  has been authorized for detection and/or diagnosis of SARS-CoV-2 by FDA under an Emergency Use Authorization (EUA). This EUA will remain  in effect (meaning this test can be used) for the duration of the COVID-19 declaration under Section 564(b)(1) of the Act, 21 U.S.C.section 360bbb-3(b)(1), unless the authorization is terminated  or revoked sooner.       Influenza A by PCR NEGATIVE NEGATIVE Final   Influenza B by PCR NEGATIVE NEGATIVE Final    Comment: (NOTE) The Xpert Xpress SARS-CoV-2/FLU/RSV plus assay is intended as an aid in the diagnosis of influenza from Nasopharyngeal swab specimens and should not be used as a sole basis for treatment. Nasal washings and aspirates are  unacceptable for Xpert Xpress SARS-CoV-2/FLU/RSV testing.  Fact Sheet for Patients: bloggercourse.com  Fact Sheet for Healthcare  Providers: seriousbroker.it  This test is not yet approved or cleared by the [place]  FDA and has been authorized for detection and/or diagnosis of SARS-CoV-2 by FDA under an Emergency Use Authorization (EUA). This EUA will remain in effect (meaning this test can be used) for the duration of the COVID-19 declaration under Section 564(b)(1) of the Act, 21 U.S.C. section 360bbb-3(b)(1), unless the authorization is terminated or revoked.     Resp Syncytial Virus by PCR NEGATIVE NEGATIVE Final    Comment: (NOTE) Fact Sheet for Patients: bloggercourse.com  Fact Sheet for Healthcare Providers: seriousbroker.it  This test is not yet approved or cleared by the [place]  FDA and has been authorized for detection and/or diagnosis of SARS-CoV-2 by FDA under an Emergency Use Authorization (EUA). This EUA will remain in effect (meaning this test can be used) for the duration of the COVID-19 declaration under Section 564(b)(1) of the Act, 21 U.S.C. section 360bbb-3(b)(1), unless the authorization is terminated or revoked.  Performed at [facility], [address], [place], [place] [address]   Blood Culture (routine x 2)     Status: None (Preliminary result)   Collection Time: 12/13/23  4:32 PM   Specimen: BLOOD RIGHT ARM  Result Value Ref Range Status   Specimen Description   Final    BLOOD RIGHT ARM Performed at [facility], [address] [place], [place] [address]    Special Requests   Final    BOTTLES DRAWN AEROBIC AND ANAEROBIC Blood Culture adequate volume Performed at [facility], [address], [place], [place] [address]    Culture   Final    NO GROWTH < 12 HOURS Performed at [facility], [address] [place], [place] [address]    Report Status PENDING  Incomplete     Labs: BNP (last 3 results) No results for input(s): BNP in the  last 8760 hours. Basic Metabolic Panel: Recent Labs  Lab 12/13/23 1320 12/14/23 0458  NA 135 140  K 3.6 3.8  CL 101 104  CO2 25 25  GLUCOSE 111* 99  BUN 13 15  CREATININE 0.72 0.61  CALCIUM  8.7* 8.2*   Liver Function Tests: Recent Labs  Lab 12/13/23 1320  AST 31  ALT 18  ALKPHOS 126  BILITOT 0.9  PROT 6.0*  ALBUMIN  3.4*   No results for input(s): LIPASE, AMYLASE in the last 168 hours. No results for input(s): AMMONIA in the last 168 hours. CBC: Recent Labs  Lab 12/13/23 1320 12/14/23 0458  WBC 14.0* 11.6*  NEUTROABS 12.5*  --   HGB 11.6* 10.5*  HCT 38.1* 35.9*  MCV 89.4 91.3  PLT 72* 69*   Cardiac Enzymes: No results for input(s): CKTOTAL, CKMB, CKMBINDEX, TROPONINI in the last 168 hours. BNP: Invalid input(s): POCBNP CBG: No results for input(s): GLUCAP in the last 168 hours. D-Dimer No results for input(s): DDIMER in the last 72 hours. Hgb A1c No results for input(s): HGBA1C in the last 72 hours. Lipid Profile No results for input(s): CHOL, HDL, LDLCALC, TRIG, CHOLHDL, LDLDIRECT in the last 72 hours. Thyroid  function studies No results for input(s): TSH, T4TOTAL, T3FREE, THYROIDAB in the last 72 hours.  Invalid input(s): FREET3 Anemia work up No results for input(s): VITAMINB12, FOLATE, FERRITIN, TIBC, IRON , RETICCTPCT in the last 72 hours. Urinalysis    Component Value Date/Time   COLORURINE YELLOW  12/13/2023 1726   APPEARANCEUR CLEAR 12/13/2023 1726   LABSPEC 1.014 12/13/2023 1726   PHURINE 5.0 12/13/2023 1726   GLUCOSEU NEGATIVE 12/13/2023 1726   GLUCOSEU NEGATIVE 09/29/2019 1125   HGBUR NEGATIVE 12/13/2023 1726   BILIRUBINUR NEGATIVE 12/13/2023 1726   BILIRUBINUR neg 03/12/2023 1601   KETONESUR NEGATIVE 12/13/2023 1726   PROTEINUR NEGATIVE 12/13/2023 1726   UROBILINOGEN 1.0 03/12/2023 1601   UROBILINOGEN 0.2 09/29/2019 1125   NITRITE NEGATIVE 12/13/2023 1726   LEUKOCYTESUR NEGATIVE  12/13/2023 1726   Sepsis Labs Recent Labs  Lab 12/13/23 1320 12/14/23 0458  WBC 14.0* 11.6*   Microbiology Recent Results (from the past 240 hours)  Blood Culture (routine x 2)     Status: None (Preliminary result)   Collection Time: 12/13/23  1:20 PM   Specimen: BLOOD LEFT FOREARM  Result Value Ref Range Status   Specimen Description   Final    BLOOD LEFT FOREARM Performed at [facility], [address] [place], [place] [address]    Special Requests   Final    BOTTLES DRAWN AEROBIC AND ANAEROBIC Blood Culture results may not be optimal due to an inadequate volume of blood received in culture bottles Performed at [name], [address], [place], [place] [address]    Culture   Final    NO GROWTH < 12 HOURS Performed at [facility], [address], [place], [place] [address]    Report Status PENDING  Incomplete  Resp panel by RT-PCR (RSV, Flu A&B, Covid) Anterior Nasal Swab     Status: None   Collection Time: 12/13/23  2:18 PM   Specimen: Anterior Nasal Swab  Result Value Ref Range Status   SARS Coronavirus 2 by RT PCR NEGATIVE NEGATIVE Final    Comment: (NOTE) SARS-CoV-2 target nucleic acids are NOT DETECTED.  The SARS-CoV-2 RNA is generally detectable in upper respiratory specimens during the acute phase of infection. The lowest concentration of SARS-CoV-2 viral copies this assay can detect is 138 copies/mL. A negative result does not preclude SARS-Cov-2 infection and should not be used as the sole basis for treatment or other patient management decisions. A negative result may occur with  improper specimen collection/handling, submission of specimen other than nasopharyngeal swab, presence of viral mutation(s) within the areas targeted by this assay, and inadequate number of viral copies(<138 copies/mL). A negative result must be combined with clinical observations, patient history, and epidemiological information. The expected  result is Negative.  Fact Sheet for Patients:  bloggercourse.com  Fact Sheet for Healthcare Providers:  seriousbroker.it  This test is no t yet approved or cleared by the [place]  FDA and  has been authorized for detection and/or diagnosis of SARS-CoV-2 by FDA under an Emergency Use Authorization (EUA). This EUA will remain  in effect (meaning this test can be used) for the duration of the COVID-19 declaration under Section 564(b)(1) of the Act, 21 U.S.C.section 360bbb-3(b)(1), unless the authorization is terminated  or revoked sooner.       Influenza A by PCR NEGATIVE NEGATIVE Final   Influenza B by PCR NEGATIVE NEGATIVE Final    Comment: (NOTE) The Xpert Xpress SARS-CoV-2/FLU/RSV plus assay is intended as an aid in the diagnosis of influenza from Nasopharyngeal swab specimens and should not be used as a sole basis for treatment. Nasal washings and aspirates are unacceptable for Xpert Xpress SARS-CoV-2/FLU/RSV testing.  Fact Sheet for Patients: bloggercourse.com  Fact Sheet for Healthcare Providers: seriousbroker.it  This test is  not yet approved or cleared by the [place]  FDA and has been authorized for detection and/or diagnosis of SARS-CoV-2 by FDA under an Emergency Use Authorization (EUA). This EUA will remain in effect (meaning this test can be used) for the duration of the COVID-19 declaration under Section 564(b)(1) of the Act, 21 U.S.C. section 360bbb-3(b)(1), unless the authorization is terminated or revoked.     Resp Syncytial Virus by PCR NEGATIVE NEGATIVE Final    Comment: (NOTE) Fact Sheet for Patients: bloggercourse.com  Fact Sheet for Healthcare Providers: seriousbroker.it  This test is not yet approved or cleared by the [place]  FDA and has been authorized for detection and/or diagnosis of  SARS-CoV-2 by FDA under an Emergency Use Authorization (EUA). This EUA will remain in effect (meaning this test can be used) for the duration of the COVID-19 declaration under Section 564(b)(1) of the Act, 21 U.S.C. section 360bbb-3(b)(1), unless the authorization is terminated or revoked.  Performed at [facility], [address] [place], [place] [address]   Blood Culture (routine x 2)     Status: None (Preliminary result)   Collection Time: 12/13/23  4:32 PM   Specimen: BLOOD RIGHT ARM  Result Value Ref Range Status   Specimen Description   Final    BLOOD RIGHT ARM Performed at [facility], [address], [place], [place] [address]    Special Requests   Final    BOTTLES DRAWN AEROBIC AND ANAEROBIC Blood Culture adequate volume Performed at [facility], [address], [place], [place] [address]    Culture   Final    NO GROWTH < 12 HOURS Performed at [facility], [address] [place], [place] [address]    Report Status PENDING  Incomplete    Procedures/Studies: DG Chest 2 View Result Date: 12/13/2023 CLINICAL DATA:  Hypoxia and fever EXAM: CHEST - 2 VIEW COMPARISON:  Chest radiograph dated 10/02/2023 FINDINGS: Normal lung volumes. Confluent right upper lobe opacity. No pleural effusion or pneumothorax. Enlarged cardiomediastinal silhouette is likely projectional. No acute osseous abnormality. IMPRESSION: Confluent right upper lobe opacity, suspicious for pneumonia. Electronically Signed   By: Limin  Xu M.D.   On: 12/13/2023 12:48     Time coordinating discharge: Over 30 minutes    Alm Apo, MD  [facility] 12/14/2023, 12:42 PM

## 2023-12-14 NOTE — Care Management Obs Status (Signed)
 MEDICARE OBSERVATION STATUS NOTIFICATION   Patient Details  Name: SIDDHARTH BABINGTON MRN: [id number] Date of Birth: 1940-06-21   Medicare Observation Status Notification Given:  Yes    Sonda Manuella Quill, RN 12/14/2023, 11:45 AM

## 2023-12-14 NOTE — TOC Transition Note (Signed)
 Transition of Care [facility]) - Discharge Note   Patient Details  Name: Brandon [name] MRN: [id number] Date of Birth: 12/27/1940  Transition of Care [facility]) CM/SW Contact:  Sonda Manuella Quill, RN Phone Number: 12/14/2023, 11:48 AM   Clinical Narrative:    D/C orders received; no IP CM needs.   Final next level of care: Home/Self Care Barriers to Discharge: No Barriers Identified   Patient Goals and CMS Choice Patient states their goals for this hospitalization and ongoing recovery are:: home          Discharge Placement                       Discharge Plan and Services Additional resources added to the After Visit Summary for     Discharge Planning Services: CM Consult Post Acute Care Choice: NA          DME Arranged: N/A DME Agency: NA       HH Arranged: NA HH Agency: NA        Social Drivers of Health (SDOH) Interventions SDOH Screenings   Food Insecurity: No Food Insecurity (12/14/2023)  Housing: Low Risk  (12/14/2023)  Transportation Needs: No Transportation Needs (12/14/2023)  Utilities: Not At Risk (12/14/2023)  Alcohol  Screen: Low Risk  (06/21/2023)  Depression (PHQ2-9): Medium Risk (11/26/2023)  Financial Resource Strain: Low Risk  (06/21/2023)  Physical Activity: Inactive (06/21/2023)  Social Connections: Socially Integrated (12/13/2023)  Stress: No Stress Concern Present (06/21/2023)  Tobacco Use: Medium Risk (12/13/2023)  Health Literacy: Adequate Health Literacy (06/21/2023)     Readmission Risk Interventions    12/14/2023   10:51 AM 08/21/2021   12:25 PM  Readmission Risk Prevention Plan  Transportation Screening Complete Complete  PCP or Specialist Appt within 3-5 Days Complete   HRI or Home Care Consult Complete   Social Work Consult for Recovery Care Planning/Counseling Complete   Palliative Care Screening Complete   Medication Review Oceanographer) Complete Complete  PCP or Specialist appointment within 3-5 days of discharge  Complete   HRI or Home Care Consult  Complete  SW Recovery Care/Counseling Consult  Complete  Skilled Nursing Facility  Not Applicable

## 2023-12-14 NOTE — Progress Notes (Signed)
 SATURATION QUALIFICATIONS: (This note is used to comply with regulatory documentation for home oxygen)  Patient Saturations on Room Air at Rest = 95%  Patient Saturations on Room Air while Ambulating = 92%  Patient Saturations on 0 Liters of oxygen while Ambulating = 95%  Please briefly explain why patient needs home oxygen:

## 2023-12-14 NOTE — Assessment & Plan Note (Signed)
-   Refill given of prednisone  8 mg daily - he has outpatient upcoming appointment with rheumatology as well

## 2023-12-14 NOTE — Assessment & Plan Note (Signed)
-   Febrile, tachypnea,  leukocytosis.  Pulmonary source -Right upper lobe moderate-sized pneumonia noted on CXR -Clinically improved overnight.  Transitioned from Rocephin  and azithromycin  to Augmentin  and doxycycline  to complete total of 7 days -Will need outpatient repeat imaging in approximately 4 weeks to ensure resolution

## 2023-12-14 NOTE — TOC Initial Note (Signed)
 Transition of Care [facility]) - Initial/Assessment Note    Patient Details  Name: Brandon [name] MRN: [id number] Date of Birth: December 16, 1940  Transition of Care [facility]) CM/SW Contact:    Sonda Manuella Quill, RN Phone Number: 12/14/2023, 10:53 AM  Clinical Narrative:                 Spoke w/ pt in room; pt said he lives at home w/ his spouse Brandon [name] [phone]); he plans to return at d/c w her support; pt verified insurance/PCP; he denied SDOH risks; pt has cane and walker; he does not have HH services or home oxygen; no IP CM needs.  Expected Discharge Plan: Home/Self Care Barriers to Discharge: No Barriers Identified   Patient Goals and CMS Choice Patient states their goals for this hospitalization and ongoing recovery are:: home          Expected Discharge Plan and Services   Discharge Planning Services: CM Consult Post Acute Care Choice: NA Living arrangements for the past 2 months: Single Family Home                 DME Arranged: N/A DME Agency: NA       HH Arranged: NA HH Agency: NA        Prior Living Arrangements/Services Living arrangements for the past 2 months: Single Family Home Lives with:: Spouse Patient language and need for interpreter reviewed:: Yes Do you feel safe going back to the place where you live?: Yes      Need for Family Participation in Patient Care: Yes (Comment) Care giver support system in place?: Yes (comment) Current home services: DME (cane, walker) Criminal Activity/Legal Involvement Pertinent to Current Situation/Hospitalization: No - Comment as needed  Activities of Daily Living   ADL Screening (condition at time of admission) Independently performs ADLs?: Yes (appropriate for developmental age) Is the patient deaf or have difficulty hearing?: Yes Does the patient have difficulty seeing, even when wearing glasses/contacts?: No Does the patient have difficulty concentrating, remembering, or making decisions?: Yes  Permission  Sought/Granted Permission sought to share information with : Case Manager Permission granted to share information with : Yes, Verbal Permission Granted  Share Information with NAME: Case Manager     Permission granted to share info w Relationship: Brandon [name] (spouse) [phone]     Emotional Assessment Appearance:: Appears stated age Attitude/Demeanor/Rapport: Gracious Affect (typically observed): Accepting Orientation: : Oriented to Self, Oriented to Place, Oriented to  Time, Oriented to Situation Alcohol  / Substance Use: Not Applicable Psych Involvement: No (comment)  Admission diagnosis:  Preop testing [Z01.818] Arthritis of left shoulder region [M19.012] Sepsis due to pneumonia (HCC) [J18.9, A41.9] Sepsis, due to unspecified organism, unspecified whether acute organ dysfunction present [facility]) [A41.9] Patient Active Problem List   Diagnosis Date Noted   Sepsis due to pneumonia (HCC) 12/13/2023   Rupture of left biceps tendon 10/01/2023   Preoperative examination 10/01/2023   Primary osteoarthritis, left shoulder 02/27/2023   Anemia of chronic disease 02/11/2023   Hyperglycemia 02/11/2023   Vitamin B12 deficiency 02/11/2023   Age related osteoporosis 08/31/2022   Malnutrition of mild degree 08/01/2022   Insomnia 06/26/2022   Arthritis of left hand 06/26/2022   Weight loss 06/26/2022   Chronic gout of multiple sites 04/30/2022   Chronically on opiate therapy 02/12/2022   Sacroiliac joint pain 02/12/2022   At risk for polypharmacy 01/19/2022   Trochanteric bursitis of left hip 01/02/2022   Dysfunction of left eustachian tube 12/05/2021   Hypotension  08/18/2021   Hyponatremia 06/26/2021   Hyperkalemia 06/26/2021   Acute metabolic encephalopathy 06/26/2021   Myoclonic jerking 06/26/2021   Lumbar radiculopathy 06/08/2021   Lumbar postlaminectomy syndrome 05/03/2021   Degeneration of lumbar intervertebral disc 05/03/2021   Lumbar spondylosis 05/03/2021   History of hip  fracture 04/24/2021   S/P abdominal aortic aneurysm repair 04/24/2021   Chronic allergic rhinitis 01/24/2021   Demand ischemia (HCC)    Elevated troponin    Chronic diastolic heart failure (HCC) 12/03/2020   Steatosis of liver 11/01/2020   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   Hepatitis B core antibody positive 11/01/2020   Benign prostatic hyperplasia 11/01/2020   Chronic obstructive pulmonary disease (HCC) 07/20/2020   History of placement of stent in LAD coronary artery 07/20/2020   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   TIA (transient ischemic attack)    Esophageal stricture    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   Primary osteoarthritis of both hands 01/11/2020   Piriformis syndrome of both sides 09/23/2019   Sciatica associated with disorder of lumbar spine 04/24/2019   Primary osteoarthritis involving multiple joints 04/24/2019   Chronic pain syndrome 04/24/2019   Mixed anxiety and depressive disorder 04/15/2018   Oropharyngeal dysphagia 03/11/2018   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   Tobacco use disorder, moderate, in early remission 01/10/2014   Essential hypertension 05/19/2013   Psoriasis 01/13/2009   Coronary artery disease 09/15/2008   Diverticulosis of colon 08/27/2007   History of benign prostatic hyperplasia 08/27/2007   Hyperlipidemia 03/19/2007   Gastroesophageal reflux disease 03/19/2007   PCP:  Thedora Garnette HERO, MD Pharmacy:   [id number] [place], [place] - [address] AT [facility] [address] [place] [address] Phone: [phone] Fax: [phone]  [id number] - [place], [place] - [address] [place] [address] Phone: [phone] Fax: [phone]     Social Drivers of Health (SDOH) Social History: SDOH Screenings   Food Insecurity: No Food Insecurity (12/14/2023)  Housing: Low Risk  (12/14/2023)   Transportation Needs: No Transportation Needs (12/14/2023)  Utilities: Not At Risk (12/14/2023)  Alcohol  Screen: Low Risk  (06/21/2023)  Depression (PHQ2-9): Medium Risk (11/26/2023)  Financial Resource Strain: Low Risk  (06/21/2023)  Physical Activity: Inactive (06/21/2023)  Social Connections: Socially Integrated (12/13/2023)  Stress: No Stress Concern Present (06/21/2023)  Tobacco Use: Medium Risk (12/13/2023)  Health Literacy: Adequate Health Literacy (06/21/2023)   SDOH Interventions: Food Insecurity Interventions: Intervention Not Indicated, Inpatient TOC Housing Interventions: Intervention Not Indicated, Inpatient TOC Transportation Interventions: Intervention Not Indicated, Inpatient TOC Utilities Interventions: Intervention Not Indicated, Inpatient TOC Social Connections Interventions: Intervention Not Indicated   Readmission Risk Interventions    12/14/2023   10:51 AM 08/21/2021   12:25 PM  Readmission Risk Prevention Plan  Transportation Screening Complete Complete  PCP or Specialist Appt within 3-5 Days Complete   HRI or Home Care Consult Complete   Social Work Consult for Recovery Care Planning/Counseling Complete   Palliative Care Screening Complete   Medication Review Oceanographer) Complete Complete  PCP or Specialist appointment within 3-5 days of discharge  Complete  HRI or Home Care Consult  Complete  SW Recovery Care/Counseling Consult  Complete  Skilled Nursing Facility  Not Applicable

## 2023-12-14 NOTE — Progress Notes (Signed)
 Discharge meds in a secure bag delivered to patient in room by this RN

## 2023-12-14 NOTE — Plan of Care (Signed)

## 2023-12-14 NOTE — Care Management CC44 (Signed)
 Condition Code 44 Documentation Completed  Patient Details  Name: AUBERT CHOYCE MRN: [id number] Date of Birth: 1940-05-10   Condition Code 44 given:  Yes Patient signature on Condition Code 44 notice:  Yes Documentation of 2 MD's agreement:  Yes Code 44 added to claim:  Yes    Sonda Manuella Quill, RN 12/14/2023, 11:45 AM

## 2023-12-14 NOTE — Assessment & Plan Note (Addendum)
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  He is s/p transcervical / transhyoid resection with left neck dissection on 12/02/17.  Pathology with a 4.0 cm tumor, 3.3 cm depth of invasion, positive margin, +PNI, +LVI, 0/32 nodes.  He underwent postop RT, completed 03/05/18. He underwent release of the tongue from gingiva on 07/07/18

## 2023-12-14 NOTE — Hospital Course (Signed)
 Brandon [name] is an 83 y.o. male with medical history significant for osteoarthritis, aortic stenosis status post TAVR, former tobacco abuser with history of tonsillar cancer, heart failure with preserved EF who presented for elective left shoulder arthroplasty on 11/7 but was found to be hypoxic and febrile being admitted to the hospitalist service with sepsis due to community-acquired pneumonia.   He also has known history of PMR and is on chronic prednisone , currently 8 mg daily. He has also had recent diagnosis of bronchitis treated with course of doxycycline  early October and repeat/extended course of doxycycline  on 10/21 due to persistent symptoms.  CXR on admission showed moderate-sized right upper lobe opacity concerning for lobar pneumonia.  He was started on Rocephin  and azithromycin .  Oxygen also started due to hypoxia.  He had good clinical improvement overnight and was able to be weaned off oxygen and performed walk test with lowest oxygen saturation 92% while ambulating. He was transitioned to Augmentin  and doxycycline  to complete total of 7-day course at discharge.

## 2023-12-17 NOTE — Progress Notes (Unsigned)
 Office Visit Note  Patient: Brandon [name]             Date of Birth: [birth year]           MRN: [id number]             PCP: Thedora Garnette HERO, MD Referring: Thedora Garnette HERO, MD Visit Date: 12/30/2023 Occupation: Data Unavailable  Subjective:  Medication monitoring   History of Present Illness: Brandon [name] is a 83 y.o. male with history of polymyalgia rheumatica. Patient remains on prednisone  8 mg daily.  He is tolerating prednisone  without any side effects but remains concerned about the long-term use of prednisone .  Patient denies any signs or symptoms of a polymyalgia rheumatica flare at this time.  He is not having any increased difficulty rising from the seated position.  He denies any joint swelling.  He states that he was scheduled for a left reverse total shoulder replacement on 12/13/2023 which had to be postponed due to being diagnosed with sepsis secondary to pneumonia.  Patient states that he will be following up with his PCP on 01/07/24 for clearance prior to rescheduling the surgery. Patient received the second dose of IV Reclast  on 10/08/2023.    Activities of Daily Living:  Patient reports morning stiffness for 2 hours.   Patient Denies nocturnal pain.  Difficulty dressing/grooming: Denies Difficulty climbing stairs: Reports Difficulty getting out of chair: Denies Difficulty using hands for taps, buttons, cutlery, and/or writing: Reports  Review of Systems  Constitutional:  Negative for fatigue.  HENT:  Positive for mouth dryness. Negative for mouth sores.   Eyes:  Negative for dryness.  Respiratory:  Negative for shortness of breath.   Cardiovascular:  Negative for chest pain and palpitations.  Gastrointestinal:  Negative for blood in stool, constipation and diarrhea.  Endocrine: Negative for increased urination.  Genitourinary:  Negative for involuntary urination.  Musculoskeletal:  Positive for joint pain, gait problem, joint pain, myalgias, muscle weakness, morning  stiffness and myalgias. Negative for joint swelling and muscle tenderness.  Skin:  Negative for color change, rash, hair loss and sensitivity to sunlight.  Allergic/Immunologic: Negative for susceptible to infections.  Neurological:  Negative for dizziness and headaches.  Hematological:  Negative for swollen glands.  Psychiatric/Behavioral:  Negative for depressed mood and sleep disturbance. The patient is not nervous/anxious.     PMFS History:  Patient Active Problem List   Diagnosis Date Noted   Sepsis due to pneumonia (HCC) 12/13/2023   Rupture of left biceps tendon 10/01/2023   Preoperative examination 10/01/2023   Primary osteoarthritis, left shoulder 02/27/2023   Anemia of chronic disease 02/11/2023   Hyperglycemia 02/11/2023   Vitamin B12 deficiency 02/11/2023   Age related osteoporosis 08/31/2022   Malnutrition of mild degree 08/01/2022   Insomnia 06/26/2022   Arthritis of left hand 06/26/2022   Weight loss 06/26/2022   Chronic gout of multiple sites 04/30/2022   Chronically on opiate therapy 02/12/2022   Sacroiliac joint pain 02/12/2022   At risk for polypharmacy 01/19/2022   Trochanteric bursitis of left hip 01/02/2022   Dysfunction of left eustachian tube 12/05/2021   Hypotension 08/18/2021   Hyponatremia 06/26/2021   Hyperkalemia 06/26/2021   Acute metabolic encephalopathy 06/26/2021   Myoclonic jerking 06/26/2021   Lumbar radiculopathy 06/08/2021   Lumbar postlaminectomy syndrome 05/03/2021   Degeneration of lumbar intervertebral disc 05/03/2021   Lumbar spondylosis 05/03/2021   History of hip fracture 04/24/2021   S/P abdominal aortic aneurysm repair 04/24/2021   Chronic  allergic rhinitis 01/24/2021   Demand ischemia (HCC)    Elevated troponin    Chronic diastolic heart failure (HCC) 12/03/2020   Steatosis of liver 11/01/2020   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   Hepatitis B core antibody positive 11/01/2020   Benign prostatic hyperplasia 11/01/2020    Chronic obstructive pulmonary disease (HCC) 07/20/2020   History of placement of stent in LAD coronary artery 07/20/2020   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   TIA (transient ischemic attack)    Esophageal stricture    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   Primary osteoarthritis of both hands 01/11/2020   Piriformis syndrome of both sides 09/23/2019   Sciatica associated with disorder of lumbar spine 04/24/2019   Primary osteoarthritis involving multiple joints 04/24/2019   Chronic pain syndrome 04/24/2019   Mixed anxiety and depressive disorder 04/15/2018   Oropharyngeal dysphagia 03/11/2018   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   Tobacco use disorder, moderate, in early remission 01/10/2014   Essential hypertension 05/19/2013   Psoriasis 01/13/2009   Coronary artery disease 09/15/2008   Diverticulosis of colon 08/27/2007   History of benign prostatic hyperplasia 08/27/2007   Hyperlipidemia 03/19/2007   Gastroesophageal reflux disease 03/19/2007    Past Medical History:  Diagnosis Date   Abdominal aneurysm    Arthritis    all over (07/19/2016)   BPH (benign prostatic hypertrophy)    CAD (coronary artery disease)    Chicken pox    Chronic lower back pain    s/p surgical fusion   COPD (chronic obstructive pulmonary disease) (HCC)    Depression    Diverticulosis    Esophageal stricture    GERD (gastroesophageal reflux disease)    Heart murmur    Hepatitis B 1984   Hiatal hernia    History of radiation therapy 01/16/18- 03/05/18   Left Tonsil, 66 Gy in 33 fractions to high risk nodal echelons.    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 Wonda and Dr Lucas    Severe aortic stenosis    Stroke [facility])    TIA - 20 years ago   TIA (transient  ischemic attack) 1990s   hx   tonsillar ca dx'd 11/2017   Tubular adenoma of colon 2009    Family History  Problem Relation Age of Onset   Heart disease Father 86       Living   Coronary artery disease Father        CABG   Alzheimer's disease Mother 40       Deceased   Arthritis Mother    Aneurysm Brother    Stomach cancer Maternal Uncle    Brain cancer Maternal Aunt        x2   Obesity Daughter        Had Bypass Sx   Past Surgical History:  Procedure Laterality Date   ABDOMINAL AORTIC ENDOVASCULAR STENT GRAFT N/A 03/03/2021   Procedure: ABDOMINAL AORTIC ENDOVASCULAR STENT GRAFT;  Surgeon: Sheree Penne Bruckner, MD;  Location: [facility] OR;  Service: Vascular;  Laterality: N/A;   ABDOMINAL AORTOGRAM W/LOWER EXTREMITY N/A 04/10/2021   Procedure: ABDOMINAL AORTOGRAM W/LOWER EXTREMITY;  Surgeon: Sheree Penne Bruckner, MD;  Location: [facility] INVASIVE CV LAB;  Service: Cardiovascular;  Laterality: N/A;   BACK SURGERY     CARDIAC  CATHETERIZATION     CATARACT EXTRACTION W/ INTRAOCULAR LENS  IMPLANT, BILATERAL Bilateral 01/2012 - 02/2012   CORONARY ATHERECTOMY N/A 04/27/2020   Procedure: CORONARY ATHERECTOMY;  Surgeon: Wonda Sharper, MD;  Location: [facility] INVASIVE CV LAB;  Service: Cardiovascular;  Laterality: N/A;   CORONARY IMAGING/OCT N/A 04/27/2020   Procedure: INTRAVASCULAR IMAGING/OCT;  Surgeon: Wonda Sharper, MD;  Location: [facility] INVASIVE CV LAB;  Service: Cardiovascular;  Laterality: N/A;   CORONARY PRESSURE/FFR STUDY N/A 04/27/2020   Procedure: INTRAVASCULAR PRESSURE WIRE/FFR STUDY;  Surgeon: Wonda Sharper, MD;  Location: [facility] INVASIVE CV LAB;  Service: Cardiovascular;  Laterality: N/A;   CORONARY STENT INTERVENTION N/A 04/27/2020   Procedure: CORONARY STENT INTERVENTION;  Surgeon: Wonda Sharper, MD;  Location: [facility] INVASIVE CV LAB;  Service: Cardiovascular;  Laterality: N/A;   DIRECT LARYNGOSCOPY Left 10/09/2017   Procedure: DIRECT LARYNGOSCOPY WITH BOPSY;  Surgeon: Arlana Arnt, MD;   Location: [facility];  Service: ENT;  Laterality: Left;   ESOPHAGOGASTRODUODENOSCOPY (EGD) WITH ESOPHAGEAL DILATION     couple times (07/19/2016)   ESOPHAGOSCOPY Left 10/09/2017   Procedure: ESOPHAGOSCOPY;  Surgeon: Arlana Arnt, MD;  Location: [facility];  Service: ENT;  Laterality: Left;   IR GASTROSTOMY TUBE MOD SED  01/08/2018   IR THORACENTESIS ASP PLEURAL SPACE W/IMG GUIDE  07/19/2016   LAPAROSCOPIC CHOLECYSTECTOMY  1984   LUMBAR DISC SURGERY  05/1996   L4-5; Dr. Alix ILES LAMINECTOMY/DECOMPRESSION MICRODISCECTOMY  10/2002   L3-4. Dr. Alix   MULTIPLE TOOTH EXTRACTIONS  1980s   PARTIAL GLOSSECTOMY  12/02/2017   Dr. Lauralee- [facility]   pharyngoplasty for closure of tingue base defect  12/02/2017   Dr. Lauralee- [facility]   PICC LINE INSERTION  07/15/2016   POSTERIOR LUMBAR FUSION  09/1996   Ray cage, L4-5 Dr. Mora   PROSTATE BIOPSY  ~ 2017   radical tonsillectomy Left 12/02/2017   Dr. Lauralee at [facility]   RIGHT HEART CATH AND CORONARY ANGIOGRAPHY N/A 03/31/2020   Procedure: RIGHT HEART CATH AND CORONARY ANGIOGRAPHY;  Surgeon: Rolan Ezra RAMAN, MD;  Location: [facility] INVASIVE CV LAB;  Service: Cardiovascular;  Laterality: N/A;   RIGID BRONCHOSCOPY Left 10/09/2017   Procedure: RIGID BRONCHOSCOPY;  Surgeon: Arlana Arnt, MD;  Location: [facility];  Service: ENT;  Laterality: Left;   TEE WITHOUT CARDIOVERSION N/A 06/07/2020   Procedure: TRANSESOPHAGEAL ECHOCARDIOGRAM (TEE);  Surgeon: Wonda Sharper, MD;  Location[facility] OR;  Service: Open Heart Surgery;  Laterality: N/A;   TONSILLECTOMY     TOTAL HIP ARTHROPLASTY Left 12/04/2020   Procedure: TOTAL HIP ARTHROPLASTY ANTERIOR APPROACH;  Surgeon: Fidel Rogue, MD;  Location: [facility] ORS;  Service: Orthopedics;  Laterality: Left;   TOTAL HIP ARTHROPLASTY Left 11/2020   TRACHEOSTOMY  12/02/2017   Dr. Lauralee- [facility]   ULTRASOUND GUIDANCE FOR VASCULAR ACCESS Right 06/07/2020   Procedure:  ULTRASOUND GUIDANCE FOR VASCULAR ACCESS;  Surgeon: Wonda Sharper, MD;  Location: [facility] OR;  Service: Open Heart Surgery;  Laterality: Right;   ULTRASOUND GUIDANCE FOR VASCULAR ACCESS Bilateral 03/03/2021   Procedure: ULTRASOUND GUIDANCE FOR VASCULAR ACCESS, BILATERAL FEMORAL ARTERIES;  Surgeon: Sheree Penne Bruckner, MD;  Location: [facility] OR;  Service: Vascular;  Laterality: Bilateral;   VASCULAR SURGERY     Social History   Tobacco Use   Smoking status: Former    Current packs/day: 0.00    Average packs/day: 1.5 packs/day for 50.0 years (75.0 ttl pk-yrs)    Types: Cigarettes    Start date: 09/06/1967  Quit date: 09/05/2017    Years since quitting: 6.3    Passive exposure: Never   Smokeless tobacco: Never   Tobacco comments:    smoked less than 1 ppd for 40+ years;   Vaping Use   Vaping status: Former  Substance Use Topics   Alcohol  use: Not Currently    Comment: occas   Drug use: Yes    Types: Morphine , Oxycodone     Comment: has prescription   Social History   Social History Narrative   ** Merged History Encounter **       Married (3rd), Etta. 2 children from 1st marriage, 4 step children.    Retired on disability due to back    Former Runner, Broadcasting/film/video.   restores antique furniture for a hobby.       Cell # [phone]     Immunization History  Administered Date(s) Administered   Fluad Quad(high Dose 65+) 10/15/2018, 11/06/2019, 10/20/2020   INFLUENZA, HIGH DOSE SEASONAL PF 11/01/2016, 10/30/2017   Influenza Split 10/11/2011, 11/03/2012, 11/20/2023   Influenza Whole 11/14/2006, 12/04/2007, 10/06/2008, 10/25/2009, 11/26/2021   Influenza, Seasonal, Injecte, Preservative Fre 12/07/2014   Influenza,inj,Quad PF,6+ Mos 11/04/2015   Influenza-Unspecified 11/19/2013, 11/06/2022   PFIZER(Purple Top)SARS-COV-2 Vaccination 02/25/2019, 03/18/2019, 11/16/2019   Pneumococcal Conjugate-13 12/07/2014   Pneumococcal Polysaccharide-23 05/30/2017   Tdap 01/28/2020      Objective: Vital Signs: BP 121/65   Pulse 67   Temp 97.8 F (36.6 C)   Resp 16   Ht 5' 8.5 (1.74 m)   Wt 137 lb 12.8 oz (62.5 kg)   BMI 20.65 kg/m    Physical Exam Vitals and nursing note reviewed.  Constitutional:      Appearance: He is well-developed.  HENT:     Head: Normocephalic and atraumatic.  Eyes:     Conjunctiva/sclera: Conjunctivae normal.     Pupils: Pupils are equal, round, and reactive to light.  Cardiovascular:     Rate and Rhythm: Normal rate and regular rhythm.     Heart sounds: Normal heart sounds.  Pulmonary:     Effort: Pulmonary effort is normal.     Breath sounds: Normal breath sounds.  Abdominal:     General: Bowel sounds are normal.     Palpations: Abdomen is soft.  Musculoskeletal:     Cervical back: Normal range of motion and neck supple.  Skin:    General: Skin is warm and dry.     Capillary Refill: Capillary refill takes less than 2 seconds.  Neurological:     Mental Status: He is alert and oriented to person, place, and time.  Psychiatric:        Behavior: Behavior normal.      Musculoskeletal Exam: C-spine has limited range of motion.  Thoracic kyphosis noted.  Limited mobility of the lumbar spine.  Right shoulder has good range of motion.  Left shoulder has severely limited range of motion with active abduction.  PIP and DIP thickening consistent with osteoarthritis of both hands.  CMC joint prominence bilaterally.  No tenderness or synovitis of MCP joints.  Hip joints have good range of motion with no discomfort-left hip is replaced.  Knee joints have good range of motion no warmth or effusion.  Ankle joints have good range of motion with no tenderness or joint swelling.  CDAI Exam: CDAI Score: -- Patient Global: --; Provider Global: -- Swollen: --; Tender: -- Joint Exam 12/30/2023   No joint exam has been documented for this visit  There is currently no information documented on the homunculus. Go to the Rheumatology  activity and complete the homunculus joint exam.  Investigation: No additional findings.  Imaging: DG Chest 2 View Result Date: 12/13/2023 CLINICAL DATA:  Hypoxia and fever EXAM: CHEST - 2 VIEW COMPARISON:  Chest radiograph dated 10/02/2023 FINDINGS: Normal lung volumes. Confluent right upper lobe opacity. No pleural effusion or pneumothorax. Enlarged cardiomediastinal silhouette is likely projectional. No acute osseous abnormality. IMPRESSION: Confluent right upper lobe opacity, suspicious for pneumonia. Electronically Signed   By: Limin  Xu M.D.   On: 12/13/2023 12:48    Recent Labs: Lab Results  Component Value Date   WBC 11.6 (H) 12/14/2023   HGB 10.5 (L) 12/14/2023   PLT 69 (L) 12/14/2023   NA 140 12/14/2023   K 3.8 12/14/2023   CL 104 12/14/2023   CO2 25 12/14/2023   GLUCOSE 99 12/14/2023   BUN 15 12/14/2023   CREATININE 0.61 12/14/2023   BILITOT 0.9 12/13/2023   ALKPHOS 126 12/13/2023   AST 31 12/13/2023   ALT 18 12/13/2023   PROT 6.0 (L) 12/13/2023   ALBUMIN  3.4 (L) 12/13/2023   CALCIUM  8.2 (L) 12/14/2023   GFRAA 107 08/11/2020   QFTBGOLDPLUS NEGATIVE 01/11/2020    Speciality Comments: Fosamax  started on January 26, 2021.  If he has dysphagia with Fosamax  or any GI intolerance we will have to switch him to Reclast .  Reclast  09/11/22, 10/08/23  Procedures:  No procedures performed Allergies: Tape and Celebrex [celecoxib]   Assessment / Plan:     Visit Diagnoses: Polymyalgia rheumatica: He has not had any signs or symptoms of a polymyalgia rheumatica flare.  He has no difficulty rising from a seated position.  He has clinically been doing well taking prednisone  8 mg daily.  He has not had any signs or symptoms of a flare on the current dose of prednisone .  The patient remains concerned about the long-term use of prednisone  but every time he has tried to taper he experiences symptoms of a flare.  He has no synovitis on examination today.  He was advised to notify us  if he  develops any signs or symptoms of a flare.  He will follow-up in the office in 5 months or sooner if needed.   Long term (current) use of systemic steroids - Prednisone  8 mg daily--unable to taper without an exacerbation of symptoms.  He is aware of the risks of long term prednisone  use. Hemoglobin A1c was 6.1% on 09/25/2023. IV reclast -2nd dose administered on 10/08/23.    Chronic left shoulder pain - Severe pain and limited range of motion.  X-rays obtained by Dr. Addie on 02/27/2023.  He had a left glenohumeral joint cortisone injection on 05/28/2023. Patient was scheduled for a reverse total arthroplasty on 12/13/2023 but the surgery had to be canceled due to being diagnosed with sepsis secondary to pneumonia.  He has a follow-up visit scheduled with his PCP on 01/07/2024 at which time he is hoping to get surgical clearance so we can reschedule the surgery.  Primary osteoarthritis of both hands: He has PIP and DIP thickening consistent with osteoarthritis of both hands.  No synovitis noted.   Status post total hip replacement, left - 12/04/20.Doing well.  Good ROM with no groin pain currently.   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 uric acid: 4.4 on 01/24/2023.  He has not had any signs or symptoms of  a gout flare.   Degeneration of intervertebral disc of lumbar region without discogenic back pain or lower extremity pain: Intermittent discomfort.  No symptoms of radiculopathy at this time.   Age-related osteoporosis without current pathological fracture: DEXA updated on 07/27/2022: Right femoral neck BMD 0.558 with T-score -2.7.  -20% change in BMD for right total femur.  Previous DEXA scan 07/21/20: showed T score -2.2 in the right femoral neck, BMD 0.638.  He was started on Fosamax  70 mg December 2022-only took initial prescription prescribed.  History of  GERD.   Long term prednisonse use/history of PMR. Hx of hip fracture after a fall.  Plan to avoid anabolic agents given history of radiation therapy. He had his first IV Reclast  infusion on 09/11/2022.  2nd dose administered  on 10/08/23.  Plan to update DEXA in June 2026.  Vitamin D  deficiency  Chronic pain syndrome: Under the care of Dr. Cornelio.   Psoriasis: No active psoriasis at this time.  Other medical conditions are listed as follows:  Aneurysm of infrarenal abdominal aorta, unspecified whether ruptured  History of aortic valve replacement  Oropharyngeal dysphagia  History of gastroesophageal reflux (GERD)  History of hyperlipidemia  Anxiety and depression  Essential hypertension: Blood pressure was 121/65 today in the office.  Cancer of tonsillar fossa (HCC)  Former smoker  History of iron  deficiency anemia  Liver abscess  History of diverticulitis  History of atherectomy  Orders: No orders of the defined types were placed in this encounter.  No orders of the defined types were placed in this encounter.    Follow-Up Instructions: Return in about 5 months (around 05/29/2024) for Polymyalgia Rheumatica.   Waddell CHRISTELLA Craze, PA-C  Note - This record has been created using Dragon software.  Chart creation errors have been sought, but may not always  have been located. Such creation errors do not reflect on  the standard of medical care.

## 2023-12-18 LAB — CULTURE, BLOOD (ROUTINE X 2)
Culture: NO GROWTH
Culture: NO GROWTH
Special Requests: ADEQUATE

## 2023-12-30 ENCOUNTER — Encounter: Payer: Self-pay | Admitting: Physician Assistant

## 2023-12-30 ENCOUNTER — Ambulatory Visit: Attending: Physician Assistant | Admitting: Physician Assistant

## 2023-12-30 VITALS — BP 121/65 | HR 67 | Temp 97.8°F | Resp 16 | Ht 68.5 in | Wt 137.8 lb

## 2023-12-30 DIAGNOSIS — F32A Depression, unspecified: Secondary | ICD-10-CM

## 2023-12-30 DIAGNOSIS — Z952 Presence of prosthetic heart valve: Secondary | ICD-10-CM

## 2023-12-30 DIAGNOSIS — M19041 Primary osteoarthritis, right hand: Secondary | ICD-10-CM | POA: Diagnosis not present

## 2023-12-30 DIAGNOSIS — C090 Malignant neoplasm of tonsillar fossa: Secondary | ICD-10-CM

## 2023-12-30 DIAGNOSIS — K750 Abscess of liver: Secondary | ICD-10-CM

## 2023-12-30 DIAGNOSIS — I7143 Infrarenal abdominal aortic aneurysm, without rupture: Secondary | ICD-10-CM

## 2023-12-30 DIAGNOSIS — Z8719 Personal history of other diseases of the digestive system: Secondary | ICD-10-CM

## 2023-12-30 DIAGNOSIS — E559 Vitamin D deficiency, unspecified: Secondary | ICD-10-CM

## 2023-12-30 DIAGNOSIS — G8929 Other chronic pain: Secondary | ICD-10-CM

## 2023-12-30 DIAGNOSIS — F419 Anxiety disorder, unspecified: Secondary | ICD-10-CM

## 2023-12-30 DIAGNOSIS — M1A0210 Idiopathic chronic gout, right elbow, without tophus (tophi): Secondary | ICD-10-CM

## 2023-12-30 DIAGNOSIS — M25512 Pain in left shoulder: Secondary | ICD-10-CM | POA: Diagnosis not present

## 2023-12-30 DIAGNOSIS — G894 Chronic pain syndrome: Secondary | ICD-10-CM

## 2023-12-30 DIAGNOSIS — Z7952 Long term (current) use of systemic steroids: Secondary | ICD-10-CM

## 2023-12-30 DIAGNOSIS — M353 Polymyalgia rheumatica: Secondary | ICD-10-CM | POA: Diagnosis not present

## 2023-12-30 DIAGNOSIS — Z87891 Personal history of nicotine dependence: Secondary | ICD-10-CM

## 2023-12-30 DIAGNOSIS — Z862 Personal history of diseases of the blood and blood-forming organs and certain disorders involving the immune mechanism: Secondary | ICD-10-CM

## 2023-12-30 DIAGNOSIS — M19042 Primary osteoarthritis, left hand: Secondary | ICD-10-CM

## 2023-12-30 DIAGNOSIS — L409 Psoriasis, unspecified: Secondary | ICD-10-CM

## 2023-12-30 DIAGNOSIS — Z9889 Other specified postprocedural states: Secondary | ICD-10-CM

## 2023-12-30 DIAGNOSIS — R1312 Dysphagia, oropharyngeal phase: Secondary | ICD-10-CM

## 2023-12-30 DIAGNOSIS — I10 Essential (primary) hypertension: Secondary | ICD-10-CM

## 2023-12-30 DIAGNOSIS — Z8639 Personal history of other endocrine, nutritional and metabolic disease: Secondary | ICD-10-CM

## 2023-12-30 DIAGNOSIS — M51369 Other intervertebral disc degeneration, lumbar region without mention of lumbar back pain or lower extremity pain: Secondary | ICD-10-CM

## 2023-12-30 DIAGNOSIS — Z96642 Presence of left artificial hip joint: Secondary | ICD-10-CM

## 2023-12-30 DIAGNOSIS — M810 Age-related osteoporosis without current pathological fracture: Secondary | ICD-10-CM

## 2024-01-06 ENCOUNTER — Encounter: Payer: Self-pay | Admitting: Physical Medicine and Rehabilitation

## 2024-01-06 ENCOUNTER — Encounter: Attending: Physical Medicine and Rehabilitation | Admitting: Physical Medicine and Rehabilitation

## 2024-01-06 VITALS — Ht 68.5 in | Wt 140.0 lb

## 2024-01-06 DIAGNOSIS — Z79891 Long term (current) use of opiate analgesic: Secondary | ICD-10-CM | POA: Diagnosis not present

## 2024-01-06 DIAGNOSIS — G894 Chronic pain syndrome: Secondary | ICD-10-CM | POA: Insufficient documentation

## 2024-01-06 DIAGNOSIS — J189 Pneumonia, unspecified organism: Secondary | ICD-10-CM | POA: Diagnosis present

## 2024-01-06 DIAGNOSIS — M1A09X0 Idiopathic chronic gout, multiple sites, without tophus (tophi): Secondary | ICD-10-CM | POA: Insufficient documentation

## 2024-01-06 DIAGNOSIS — M353 Polymyalgia rheumatica: Secondary | ICD-10-CM | POA: Insufficient documentation

## 2024-01-06 DIAGNOSIS — M25512 Pain in left shoulder: Secondary | ICD-10-CM | POA: Diagnosis present

## 2024-01-06 DIAGNOSIS — G8929 Other chronic pain: Secondary | ICD-10-CM | POA: Diagnosis present

## 2024-01-06 MED ORDER — OXYCODONE HCL 10 MG PO TABS: 10.0000 mg | ORAL_TABLET | ORAL | 0 refills | Status: DC | PRN

## 2024-01-06 MED ORDER — MORPHINE SULFATE ER 15 MG PO TBCR: 15.0000 mg | EXTENDED_RELEASE_TABLET | Freq: Two times a day (BID) | ORAL | 0 refills | Status: DC

## 2024-01-06 NOTE — Progress Notes (Signed)
 Subjective:    Patient ID: Brandon [name], male    DOB: 08-15-1940, 83 y.o.   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 S/p L THR- 12/02/20  Getting AAA repair as of 1/23. Pt also had  5th R finger fx- out of brace now.    2 weeks ago went to get L shoulder surgery, but was congested and stuffy and running low grade temp.   Had a touch of pneumonia. So they wouldn't let him have surgery.  Has appt with PCP to get checked for/cleared for surgery.   Still having congestion Today trouble talking, running out of breath.  Takes inhalers at home- is taking them.   Has appt with Dr Thedora Wednesday 11am 12/3  Blows nose q10 minutes since radiation from throat cancer- is clear, when coughed it up. White foam.  Heart been doing good.  No LE edema or fluid buildup  Cold weather makes it worse.     Got ESI by Dr Bonner- almost due again, it's been so long since had it done.   Goes to see Rheum in December 2 weeks from now.  For PMR- been about the same- not on steroids anymore. The pain meds really help.   Last oral drug screen 11/06/23.   Still doing woodworking.  Son remodeling house- and helping him.   Is stable overall. Meds working well In fact and got up this AM, and only took 1 oxy- and probably won't take another one til 2-3 pm.  Not as active, so doesn't need as much  Wife had knee injections with gel- didn't go well.  Doesn't think she wants surgery on knee.   Pain Inventory Average Pain 5 Pain Right Now 6 My pain is intermittent, constant, stabbing, and aching  In the last 24 hours,  has pain interfered with the following? General activity 5 Relation with others 6 Enjoyment of life 7 What TIME of day is your pain at its worst? morning  Sleep (in general) Fair  Pain is worse with: walking, bending, and standing Pain improves with: rest, pacing activities, and medication Relief from Meds: 8  Family History  Problem Relation Age of Onset   Heart disease Father 73       Living   Coronary artery disease Father        CABG   Alzheimer's disease Mother 14       Deceased   Arthritis Mother    Aneurysm Brother    Stomach cancer Maternal Uncle    Brain cancer Maternal Aunt        x2   Obesity Daughter        Had Bypass Sx   Social History   Socioeconomic History   Marital status: Married    Spouse name: etta   Number of children: 2   Years of education: Not on file   Highest education level: Not on file  Occupational History   Occupation: retired    Associate Professor: RETIRED    Comment: disabled due to  back problems  Tobacco Use   Smoking status: Former    Current packs/day: 0.00    Average packs/day: 1.5 packs/day for 50.0 years (75.0 ttl pk-yrs)    Types: Cigarettes    Start date: 09/06/1967    Quit date: 09/05/2017    Years since quitting: 6.3    Passive exposure: Never   Smokeless tobacco: Never   Tobacco comments:    smoked less than 1 ppd for 40+ years;   Vaping Use   Vaping status: Former  Substance and Sexual Activity   Alcohol  use: Not Currently    Comment: occas   Drug use: Yes    Types: Morphine , Oxycodone     Comment: has prescription   Sexual activity: Not Currently  Other Topics Concern   Not on file  Social History Narrative   ** Merged History Encounter **       Married (3rd), Etta. 2 children from 1st marriage, 4 step children.    Retired on disability due to back    Former Runner, Broadcasting/film/video.   restores antique furniture for a hobby.       Cell # [phone]   Social Drivers of Health   Financial Resource  Strain: Low Risk  (06/21/2023)   Overall Financial Resource Strain (CARDIA)    Difficulty of Paying Living Expenses: Not hard at all  Food Insecurity: No Food Insecurity (12/14/2023)   Hunger Vital Sign    Worried About Running Out of Food in the Last Year: Never true    Ran Out of Food in the Last Year: Never true  Transportation Needs: No Transportation Needs (12/14/2023)   PRAPARE - Administrator, Civil Service (Medical): No    Lack of Transportation (Non-Medical): No  Physical Activity: Inactive (06/21/2023)   Exercise Vital Sign    Days of Exercise per Week: 0 days    Minutes of Exercise per Session: 0 min  Stress: No Stress Concern Present (06/21/2023)   [employer] of Occupational Health - Occupational Stress Questionnaire    Feeling of Stress : Not at all  Social Connections: Socially Integrated (12/13/2023)   Social Connection and Isolation Panel    Frequency of Communication with Friends and Family: Never    Frequency of Social Gatherings with Friends and Family: More than three times a week    Attends Religious Services: More than 4 times per year    Active Member of Clubs or Organizations: No    Attends Engineer, Structural: More than 4 times per year    Marital Status: Married   Past Surgical History:  Procedure Laterality Date   ABDOMINAL AORTIC ENDOVASCULAR STENT GRAFT N/A 03/03/2021   Procedure: ABDOMINAL AORTIC ENDOVASCULAR STENT GRAFT;  Surgeon: Sheree Penne Bruckner, MD;  Location: [facility] OR;  Service: Vascular;  Laterality: N/A;   ABDOMINAL AORTOGRAM W/LOWER EXTREMITY N/A 04/10/2021   Procedure: ABDOMINAL AORTOGRAM W/LOWER EXTREMITY;  Surgeon: Sheree Penne Bruckner, MD;  Location: [facility] INVASIVE CV LAB;  Service: Cardiovascular;  Laterality: N/A;   BACK SURGERY     CARDIAC CATHETERIZATION     CATARACT EXTRACTION W/ INTRAOCULAR LENS  IMPLANT, BILATERAL Bilateral 01/2012 - 02/2012   CORONARY ATHERECTOMY N/A 04/27/2020   Procedure: CORONARY  ATHERECTOMY;  Surgeon: Wonda Sharper, MD;  Location: [facility] INVASIVE CV LAB;  Service: Cardiovascular;  Laterality: N/A;   CORONARY IMAGING/OCT N/A 04/27/2020   Procedure: INTRAVASCULAR IMAGING/OCT;  Surgeon: Wonda Sharper, MD;  Location: [facility] INVASIVE CV LAB;  Service: Cardiovascular;  Laterality:  N/A;   CORONARY PRESSURE/FFR STUDY N/A 04/27/2020   Procedure: INTRAVASCULAR PRESSURE WIRE/FFR STUDY;  Surgeon: Wonda Sharper, MD;  Location: [facility] INVASIVE CV LAB;  Service: Cardiovascular;  Laterality: N/A;   CORONARY STENT INTERVENTION N/A 04/27/2020   Procedure: CORONARY STENT INTERVENTION;  Surgeon: Wonda Sharper, MD;  Location: [facility] INVASIVE CV LAB;  Service: Cardiovascular;  Laterality: N/A;   DIRECT LARYNGOSCOPY Left 10/09/2017   Procedure: DIRECT LARYNGOSCOPY WITH BOPSY;  Surgeon: Arlana Arnt, MD;  Location: [facility];  Service: ENT;  Laterality: Left;   ESOPHAGOGASTRODUODENOSCOPY (EGD) WITH ESOPHAGEAL DILATION     couple times (07/19/2016)   ESOPHAGOSCOPY Left 10/09/2017   Procedure: ESOPHAGOSCOPY;  Surgeon: Arlana Arnt, MD;  Location: [facility];  Service: ENT;  Laterality: Left;   IR GASTROSTOMY TUBE MOD SED  01/08/2018   IR THORACENTESIS ASP PLEURAL SPACE W/IMG GUIDE  07/19/2016   LAPAROSCOPIC CHOLECYSTECTOMY  1984   LUMBAR DISC SURGERY  05/1996   L4-5; Dr. Alix ILES LAMINECTOMY/DECOMPRESSION MICRODISCECTOMY  10/2002   L3-4. Dr. Alix   MULTIPLE TOOTH EXTRACTIONS  1980s   PARTIAL GLOSSECTOMY  12/02/2017   Dr. Lauralee- [facility]   pharyngoplasty for closure of tingue base defect  12/02/2017   Dr. Lauralee- [facility]   PICC LINE INSERTION  07/15/2016   POSTERIOR LUMBAR FUSION  09/1996   Ray cage, L4-5 Dr. Mora   PROSTATE BIOPSY  ~ 2017   radical tonsillectomy Left 12/02/2017   Dr. [facility]e at [facility]   RIGHT HEART CATH AND CORONARY ANGIOGRAPHY N/A 03/31/2020   Procedure: RIGHT HEART CATH AND CORONARY ANGIOGRAPHY;  Surgeon: Rolan Ezra RAMAN,  MD;  Location: [facility] INVASIVE CV LAB;  Service: Cardiovascular;  Laterality: N/A;   RIGID BRONCHOSCOPY Left 10/09/2017   Procedure: RIGID BRONCHOSCOPY;  Surgeon: Arlana Arnt, MD;  Location: [facility];  Service: ENT;  Laterality: Left;   TEE WITHOUT CARDIOVERSION N/A 06/07/2020   Procedure: TRANSESOPHAGEAL ECHOCARDIOGRAM (TEE);  Surgeon: Wonda Sharper, MD;  Location: [facility] OR;  Service: Open Heart Surgery;  Laterality: N/A;   TONSILLECTOMY     TOTAL HIP ARTHROPLASTY Left 12/04/2020   Procedure: TOTAL HIP ARTHROPLASTY ANTERIOR APPROACH;  Surgeon: Fidel Rogue, MD;  Location: [facility] ORS;  Service: Orthopedics;  Laterality: Left;   TOTAL HIP ARTHROPLASTY Left 11/2020   TRACHEOSTOMY  12/02/2017   Dr. Lauralee- [facility]   ULTRASOUND GUIDANCE FOR VASCULAR ACCESS Right 06/07/2020   Procedure: ULTRASOUND GUIDANCE FOR VASCULAR ACCESS;  Surgeon: Wonda Sharper, MD;  Location: [facility] OR;  Service: Open Heart Surgery;  Laterality: Right;   ULTRASOUND GUIDANCE FOR VASCULAR ACCESS Bilateral 03/03/2021   Procedure: ULTRASOUND GUIDANCE FOR VASCULAR ACCESS, BILATERAL FEMORAL ARTERIES;  Surgeon: Sheree Penne Bruckner, MD;  Location: [facility] OR;  Service: Vascular;  Laterality: Bilateral;   VASCULAR SURGERY     Past Surgical History:  Procedure Laterality Date   ABDOMINAL AORTIC ENDOVASCULAR STENT GRAFT N/A 03/03/2021   Procedure: ABDOMINAL AORTIC ENDOVASCULAR STENT GRAFT;  Surgeon: Sheree Penne Bruckner, MD;  Location: [facility] OR;  Service: Vascular;  Laterality: N/A;   ABDOMINAL AORTOGRAM W/LOWER EXTREMITY N/A 04/10/2021   Procedure: ABDOMINAL AORTOGRAM W/LOWER EXTREMITY;  Surgeon: Sheree Penne Bruckner, MD;  Location: [facility] INVASIVE CV LAB;  Service: Cardiovascular;  Laterality: N/A;   BACK SURGERY     CARDIAC CATHETERIZATION     CATARACT EXTRACTION W/ INTRAOCULAR LENS  IMPLANT, BILATERAL Bilateral 01/2012 - 02/2012   CORONARY ATHERECTOMY N/A 04/27/2020   Procedure: CORONARY ATHERECTOMY;  Surgeon: Wonda Sharper, MD;  Location: [facility] INVASIVE CV LAB;  Service: Cardiovascular;  Laterality: N/A;   CORONARY IMAGING/OCT N/A 04/27/2020   Procedure: INTRAVASCULAR IMAGING/OCT;  Surgeon: Wonda Sharper, MD;  Location: [facility] INVASIVE CV LAB;  Service: Cardiovascular;  Laterality: N/A;   CORONARY PRESSURE/FFR STUDY N/A 04/27/2020   Procedure: INTRAVASCULAR PRESSURE WIRE/FFR STUDY;  Surgeon: Wonda Sharper, MD;  Location: [facility] INVASIVE CV LAB;  Service: Cardiovascular;  Laterality: N/A;   CORONARY STENT INTERVENTION N/A 04/27/2020   Procedure: CORONARY STENT INTERVENTION;  Surgeon: Wonda Sharper, MD;  Location: [facility] INVASIVE CV LAB;  Service: Cardiovascular;  Laterality: N/A;   DIRECT LARYNGOSCOPY Left 10/09/2017   Procedure: DIRECT LARYNGOSCOPY WITH BOPSY;  Surgeon: Arlana Arnt, MD;  Location: [facility];  Service: ENT;  Laterality: Left;   ESOPHAGOGASTRODUODENOSCOPY (EGD) WITH ESOPHAGEAL DILATION     couple times (07/19/2016)   ESOPHAGOSCOPY Left 10/09/2017   Procedure: ESOPHAGOSCOPY;  Surgeon: Arlana Arnt, MD;  Location: [facility];  Service: ENT;  Laterality: Left;   IR GASTROSTOMY TUBE MOD SED  01/08/2018   IR THORACENTESIS ASP PLEURAL SPACE W/IMG GUIDE  07/19/2016   LAPAROSCOPIC CHOLECYSTECTOMY  1984   LUMBAR DISC SURGERY  05/1996   L4-5; Dr. Alix ILES LAMINECTOMY/DECOMPRESSION MICRODISCECTOMY  10/2002   L3-4. Dr. Alix   MULTIPLE TOOTH EXTRACTIONS  1980s   PARTIAL GLOSSECTOMY  12/02/2017   Dr. [facility]- [facility]   pharyngoplasty for closure of tingue base defect  12/02/2017   Dr. [facility]alee- [facility]   PICC LINE INSERTION  07/15/2016   POSTERIOR LUMBAR FUSION  09/1996   Ray cage, L4-5 Dr. Mora   PROSTATE BIOPSY  ~ 2017   radical tonsillectomy Left 12/02/2017   Dr. [facility]alee at [facility]   RIGHT HEART CATH AND CORONARY ANGIOGRAPHY N/A 03/31/2020   Procedure: RIGHT HEART CATH AND CORONARY ANGIOGRAPHY;  Surgeon: Rolan Ezra RAMAN, MD;  Location: [facility] INVASIVE CV  LAB;  Service: Cardiovascular;  Laterality: N/A;   RIGID BRONCHOSCOPY Left 10/09/2017   Procedure: RIGID BRONCHOSCOPY;  Surgeon: Arlana Arnt, MD;  Location: [facility];  Service: ENT;  Laterality: Left;   TEE WITHOUT CARDIOVERSION N/A 06/07/2020   Procedure: TRANSESOPHAGEAL ECHOCARDIOGRAM (TEE);  Surgeon: Wonda Sharper, MD;  Location: [facility] OR;  Service: Open Heart Surgery;  Laterality: N/A;   TONSILLECTOMY     TOTAL HIP ARTHROPLASTY Left 12/04/2020   Procedure: TOTAL HIP ARTHROPLASTY ANTERIOR APPROACH;  Surgeon: Fidel Rogue, MD;  Location: [facility];  Service: Orthopedics;  Laterality: Left;   TOTAL HIP ARTHROPLASTY Left 11/2020   TRACHEOSTOMY  12/02/2017   Dr. [facility]alee- [facility]   ULTRASOUND GUIDANCE FOR VASCULAR ACCESS Right 06/07/2020   Procedure: ULTRASOUND GUIDANCE FOR VASCULAR ACCESS;  Surgeon: Wonda Sharper, MD;  Location: [facility] OR;  Service: Open Heart Surgery;  Laterality: Right;   ULTRASOUND GUIDANCE FOR VASCULAR ACCESS Bilateral 03/03/2021   Procedure: ULTRASOUND GUIDANCE FOR VASCULAR ACCESS, BILATERAL FEMORAL ARTERIES;  Surgeon: Sheree Penne Bruckner, MD;  Location: [facility] OR;  Service: Vascular;  Laterality: Bilateral;   VASCULAR SURGERY     Past Medical History:  Diagnosis Date   Abdominal aneurysm    Arthritis    all over (07/19/2016)   BPH (benign prostatic hypertrophy)    CAD (coronary artery disease)    Chicken pox    Chronic lower back pain    s/p surgical fusion   COPD (chronic obstructive pulmonary disease) (HCC)    Depression    Diverticulosis    Esophageal stricture    GERD (gastroesophageal reflux disease)  Heart murmur    Hepatitis B 1984   Hiatal hernia    History of radiation therapy 01/16/18- 03/05/18   Left Tonsil, 66 Gy in 33 fractions to high risk nodal echelons.    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 Wonda and Dr Lucas    Severe aortic stenosis    Stroke [facility])    TIA - 20 years ago   TIA (transient ischemic attack) 1990s   hx   tonsillar ca dx'd 11/2017   Tubular adenoma of colon 2009   There were no vitals taken for this visit.  Opioid Risk Score:   Fall Risk Score:  `1  Depression screen [facility] 2/9     11/26/2023    3:36 PM 11/11/2023    1:22 PM 11/06/2023   11:03 AM 09/11/2023   10:34 AM 07/12/2023   11:31 AM 06/21/2023    9:40 AM 05/15/2023   11:10 AM  Depression screen PHQ 2/9  Decreased Interest 0 0 0 0 0 0 0  Down, Depressed, Hopeless 0 0 0 0 0 0 0  PHQ - 2 Score 0 0 0 0 0 0 0  Altered sleeping 2    0 0   Tired, decreased energy 2    0 1   Change in appetite 1    0 3   Feeling bad or failure about yourself  0    0 0   Trouble concentrating 0    0 0   Moving slowly or fidgety/restless 0    0 0   Suicidal thoughts 0    0 0   PHQ-9 Score 5     0  4    Difficult doing work/chores Somewhat difficult    Not difficult at all Somewhat difficult      Data saved with a previous flowsheet row definition    Review of Systems  Musculoskeletal:  Positive for back pain.       Left shoulder pain, right hip pain down to the right foot  All other systems reviewed and are negative.      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 S/p L THR- 12/02/20  Getting AAA repair as of 1/23. Pt also  had  5th R finger fx- out of brace now.   Is short of breath today- could be didn't get correct antibiotic vs not fully cleared.  Need to speak with Dr Thedora about this! Needs f/u on CXR  2. For L shoulder pain-  Once medically stable, will need to reschedule shoulder surgery on L.   3. Refilled Oxycodone  10 mg # 150- 2 refills- G89.3-   4. Con't MS Contin  15 mg 2x/day- sent 2 refills- G89.3 and G89.21  5. Oral screen drug screen done last appt in October- not due today per clinic policy  6. F/U in months with Fidela and 4 months with me- Chronic pain and PMR/chronic pain due to Cancer   I spent a total of  26  minutes on total care today- >50% coordination of care- due to d/w pt about pain- and L shoulder surgery- and increasing meds for surgery.  Also refilled meds and d/w pt about shoulder surgery nad recovery from pneumonia.

## 2024-01-06 NOTE — Patient Instructions (Signed)
 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 S/p L THR- 12/02/20  Getting AAA repair as of 1/23. Pt also had  5th R finger fx- out of brace now.   Is short of breath today- could be didn't get correct antibiotic vs not fully cleared.  Need to speak with Dr Thedora about this! Needs f/u on CXR  2. For L shoulder pain-  Once medically stable, will need to reschedule shoulder surgery on L.   3. Refilled Oxycodone  10 mg # 150- 2 refills- G89.3-   4. Con't MS Contin  15 mg 2x/day- sent 2 refills- G89.3 and G89.21  5. Oral screen drug screen done last appt in October- not due today per clinic policy  6. F/U in months with Fidela and 4 months with me- Chronic pain and PMR/chronic pain due to Cancer

## 2024-01-08 ENCOUNTER — Encounter: Payer: Self-pay | Admitting: Family Medicine

## 2024-01-08 ENCOUNTER — Ambulatory Visit: Admitting: Family Medicine

## 2024-01-08 ENCOUNTER — Ambulatory Visit

## 2024-01-08 VITALS — BP 122/74 | HR 69 | Temp 97.9°F | Ht 68.5 in | Wt 140.0 lb

## 2024-01-08 DIAGNOSIS — R918 Other nonspecific abnormal finding of lung field: Secondary | ICD-10-CM | POA: Diagnosis not present

## 2024-01-08 DIAGNOSIS — J189 Pneumonia, unspecified organism: Secondary | ICD-10-CM

## 2024-01-08 DIAGNOSIS — J309 Allergic rhinitis, unspecified: Secondary | ICD-10-CM | POA: Diagnosis not present

## 2024-01-08 DIAGNOSIS — I10 Essential (primary) hypertension: Secondary | ICD-10-CM | POA: Diagnosis not present

## 2024-01-08 DIAGNOSIS — Z01818 Encounter for other preprocedural examination: Secondary | ICD-10-CM

## 2024-01-08 DIAGNOSIS — J439 Emphysema, unspecified: Secondary | ICD-10-CM

## 2024-01-08 DIAGNOSIS — I2510 Atherosclerotic heart disease of native coronary artery without angina pectoris: Secondary | ICD-10-CM | POA: Diagnosis not present

## 2024-01-08 DIAGNOSIS — I5032 Chronic diastolic (congestive) heart failure: Secondary | ICD-10-CM

## 2024-01-08 DIAGNOSIS — M19012 Primary osteoarthritis, left shoulder: Secondary | ICD-10-CM

## 2024-01-08 NOTE — Assessment & Plan Note (Addendum)
 No angina. Continue aspirin  81 mg daily and carvedilol  12.5 mg bid.

## 2024-01-08 NOTE — Assessment & Plan Note (Signed)
 Continue daily nasal steroid and use of nasal saline washes.

## 2024-01-08 NOTE — Assessment & Plan Note (Signed)
 Blood pressure is in good control. Continue amlodipine 10 mg daily and carvedilol 12.5 mg twice daily.

## 2024-01-08 NOTE — Assessment & Plan Note (Signed)
 Plan for reverse total shoulder joint replacement. Recommend he reach out to Dr. Kay concerning rescheduling of this.

## 2024-01-08 NOTE — Progress Notes (Signed)
 [facility] [address] [place] [address] Dept: [phone] Dept Fax: [phone]  Hospital Follow-up Visit  Subjective:    Patient ID: Brandon [name], male    DOB: 1940/04/06, 83 y.o..   MRN: [id number]  Chief Complaint  Patient presents with   Follow-up    F/u after having Pneumonia.      History of Present Illness:  Patient is in today for pre-operative evaluation for left shoulder arthroplasty. This was originally scheduled for 12/13/2023, however due to him being diagnosed with sepsis secondary to pneumonia the procedure was postponed. He was admitted at [facility] from 11/7-11/09/2023 with a right upper lobe pneumonia and sepsis. He notes that he feels much better at this point. He does have a history of COPD and is having morning cough with white sputum production and some hoarseness at times. He has a history fo chronic rhinitis. he uses a fluticasone  nasal spray daily. He does do nasal saline washes as well.  Past Medical History: Patient Active Problem List   Diagnosis Date Noted   Sepsis due to pneumonia (HCC) 12/13/2023   Rupture of left biceps tendon 10/01/2023   Preoperative examination 10/01/2023   Primary osteoarthritis, left shoulder 02/27/2023   Anemia of chronic disease 02/11/2023   Hyperglycemia 02/11/2023   Vitamin B12 deficiency 02/11/2023   Age related osteoporosis 08/31/2022   Malnutrition of mild degree 08/01/2022   Insomnia 06/26/2022   Arthritis of left hand 06/26/2022   Weight loss 06/26/2022   Chronic gout of multiple sites 04/30/2022   Chronically on opiate therapy 02/12/2022   Sacroiliac joint pain 02/12/2022   At risk for polypharmacy 01/19/2022   Trochanteric bursitis of left hip 01/02/2022   Dysfunction of left eustachian tube 12/05/2021   Hypotension 08/18/2021   Hyponatremia 06/26/2021   Hyperkalemia 06/26/2021   Acute metabolic encephalopathy 06/26/2021   Myoclonic jerking 06/26/2021    Lumbar radiculopathy 06/08/2021   Lumbar postlaminectomy syndrome 05/03/2021   Degeneration of lumbar intervertebral disc 05/03/2021   Lumbar spondylosis 05/03/2021   History of hip fracture 04/24/2021   S/P abdominal aortic aneurysm repair 04/24/2021   Chronic allergic rhinitis 01/24/2021   Demand ischemia (HCC)    Elevated troponin    Chronic diastolic heart failure (HCC) 12/03/2020   Steatosis of liver 11/01/2020   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   Hepatitis B core antibody positive 11/01/2020   Benign prostatic hyperplasia 11/01/2020   Chronic obstructive pulmonary disease (HCC) 07/20/2020   History of placement of stent in LAD coronary artery 07/20/2020   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   TIA (transient ischemic attack)    Esophageal stricture    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   Primary osteoarthritis of both hands 01/11/2020   Piriformis syndrome of both sides 09/23/2019   Sciatica associated with disorder of lumbar spine 04/24/2019   Primary osteoarthritis involving multiple joints 04/24/2019   Chronic pain syndrome 04/24/2019   Mixed anxiety and depressive disorder 04/15/2018   Oropharyngeal dysphagia 03/11/2018   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   Tobacco use disorder, moderate, in early remission 01/10/2014   Chronic left shoulder pain 01/10/2014   Essential hypertension 05/19/2013   Psoriasis 01/13/2009   Coronary artery disease 09/15/2008   Diverticulosis of colon 08/27/2007   History of benign prostatic hyperplasia 08/27/2007   Hyperlipidemia 03/19/2007   Gastroesophageal reflux disease 03/19/2007   Past Surgical History:  Procedure Laterality Date   ABDOMINAL AORTIC ENDOVASCULAR STENT GRAFT N/A 03/03/2021  Procedure: ABDOMINAL AORTIC ENDOVASCULAR STENT GRAFT;  Surgeon: Sheree Penne Bruckner, MD;  Location: [facility];  Service: Vascular;  Laterality: N/A;   ABDOMINAL AORTOGRAM W/LOWER EXTREMITY N/A  04/10/2021   Procedure: ABDOMINAL AORTOGRAM W/LOWER EXTREMITY;  Surgeon: Sheree Penne Bruckner, MD;  Location: [facility] INVASIVE CV LAB;  Service: Cardiovascular;  Laterality: N/A;   BACK SURGERY     CARDIAC CATHETERIZATION     CATARACT EXTRACTION W/ INTRAOCULAR LENS  IMPLANT, BILATERAL Bilateral 01/2012 - 02/2012   CORONARY ATHERECTOMY N/A 04/27/2020   Procedure: CORONARY ATHERECTOMY;  Surgeon: Wonda Sharper, MD;  Location: [facility] INVASIVE CV LAB;  Service: Cardiovascular;  Laterality: N/A;   CORONARY IMAGING/OCT N/A 04/27/2020   Procedure: INTRAVASCULAR IMAGING/OCT;  Surgeon: Wonda Sharper, MD;  Location: [facility] INVASIVE CV LAB;  Service: Cardiovascular;  Laterality: N/A;   CORONARY PRESSURE/FFR STUDY N/A 04/27/2020   Procedure: INTRAVASCULAR PRESSURE WIRE/FFR STUDY;  Surgeon: Wonda Sharper, MD;  Location: [facility] INVASIVE CV LAB;  Service: Cardiovascular;  Laterality: N/A;   CORONARY STENT INTERVENTION N/A 04/27/2020   Procedure: CORONARY STENT INTERVENTION;  Surgeon: Wonda Sharper, MD;  Location: [facility] INVASIVE CV LAB;  Service: Cardiovascular;  Laterality: N/A;   DIRECT LARYNGOSCOPY Left 10/09/2017   Procedure: DIRECT LARYNGOSCOPY WITH BOPSY;  Surgeon: Arlana Arnt, MD;  Location: [facility];  Service: ENT;  Laterality: Left;   ESOPHAGOGASTRODUODENOSCOPY (EGD) WITH ESOPHAGEAL DILATION     couple times (07/19/2016)   ESOPHAGOSCOPY Left 10/09/2017   Procedure: ESOPHAGOSCOPY;  Surgeon: Arlana Arnt, MD;  Location: [facility];  Service: ENT;  Laterality: Left;   IR GASTROSTOMY TUBE MOD SED  01/08/2018   IR THORACENTESIS ASP PLEURAL SPACE W/IMG GUIDE  07/19/2016   LAPAROSCOPIC CHOLECYSTECTOMY  1984   LUMBAR DISC SURGERY  05/1996   L4-5; Dr. Alix ILES LAMINECTOMY/DECOMPRESSION MICRODISCECTOMY  10/2002   L3-4. Dr. Alix   MULTIPLE TOOTH EXTRACTIONS  1980s   PARTIAL GLOSSECTOMY  12/02/2017   Dr. Lauralee- [facility]   pharyngoplasty for closure of tingue base  defect  12/02/2017   Dr. Lauralee- [facility]   PICC LINE INSERTION  07/15/2016   POSTERIOR LUMBAR FUSION  09/1996   Ray cage, L4-5 Dr. Mora   PROSTATE BIOPSY  ~ 2017   radical tonsillectomy Left 12/02/2017   Dr. [facility]alee [facility]   RIGHT HEART CATH AND CORONARY ANGIOGRAPHY N/A 03/31/2020   Procedure: RIGHT HEART CATH AND CORONARY ANGIOGRAPHY;  Surgeon: Rolan Ezra RAMAN, MD;  Location: [facility] INVASIVE CV LAB;  Service: Cardiovascular;  Laterality: N/A;   RIGID BRONCHOSCOPY Left 10/09/2017   Procedure: RIGID BRONCHOSCOPY;  Surgeon: Arlana Arnt, MD;  Location: [facility];  Service: ENT;  Laterality: Left;   TEE WITHOUT CARDIOVERSION N/A 06/07/2020   Procedure: TRANSESOPHAGEAL ECHOCARDIOGRAM (TEE);  Surgeon: Wonda Sharper, MD;  Location: [facility] OR;  Service: Open Heart Surgery;  Laterality: N/A;   TONSILLECTOMY     TOTAL HIP ARTHROPLASTY Left 12/04/2020   Procedure: TOTAL HIP ARTHROPLASTY ANTERIOR APPROACH;  Surgeon: Fidel Rogue, MD;  Location: [facility] ORS;  Service: Orthopedics;  Laterality: Left;   TOTAL HIP ARTHROPLASTY Left 11/2020   TRACHEOSTOMY  12/02/2017   Dr. [facility]alee- [facility]   ULTRASOUND GUIDANCE FOR VASCULAR ACCESS Right 06/07/2020   Procedure: ULTRASOUND GUIDANCE FOR VASCULAR ACCESS;  Surgeon: Wonda Sharper, MD;  Location: [facility] OR;  Service: Open Heart Surgery;  Laterality: Right;   ULTRASOUND GUIDANCE FOR VASCULAR ACCESS Bilateral 03/03/2021   Procedure: ULTRASOUND GUIDANCE FOR VASCULAR ACCESS, BILATERAL FEMORAL ARTERIES;  Surgeon: Sheree Penne  Lonni, MD;  Location: [facility] OR;  Service: Vascular;  Laterality: Bilateral;   VASCULAR SURGERY     Family History  Problem Relation Age of Onset   Heart disease Father 70       Living   Coronary artery disease Father        CABG   Alzheimer's disease Mother 37       Deceased   Arthritis Mother    Aneurysm Brother    Stomach cancer Maternal Uncle    Brain cancer Maternal Aunt        x2   Obesity Daughter        Had  Bypass Sx   Outpatient Medications Prior to Visit  Medication Sig Dispense Refill   acetaminophen  (TYLENOL ) 500 MG tablet Take 1,000 mg by mouth every 8 (eight) hours as needed for mild pain or headache.     albuterol  (VENTOLIN  HFA) 108 (90 Base) MCG/ACT inhaler Inhale 2 puffs into the lungs every 6 (six) hours as needed for wheezing or shortness of breath. 8 g 2   allopurinol  (ZYLOPRIM ) 100 MG tablet TAKE 2 TABLETS(200 MG) BY MOUTH DAILY 180 tablet 0   amLODipine  (NORVASC ) 10 MG tablet Take 1 tablet (10 mg total) by mouth daily. 90 tablet 3   aspirin  EC 81 MG tablet Take 81 mg by mouth at bedtime. Swallow whole.     atorvastatin  (LIPITOR ) 80 MG tablet Take 1 tablet (80 mg total) by mouth daily. 90 tablet 3   benzonatate  (TESSALON ) 100 MG capsule Take 1 capsule (100 mg total) by mouth 3 (three) times daily as needed. 30 capsule 0   carvedilol  (COREG ) 12.5 MG tablet Take 1 tablet (12.5 mg total) by mouth 2 (two) times daily. 180 tablet 3   cyanocobalamin  (VITAMIN B12) 1000 MCG tablet Take 1 tablet (1,000 mcg total) by mouth daily. 90 tablet 3   dorzolamide -timolol  (COSOPT ) 2-0.5 % ophthalmic solution Place 1 drop into the left eye 2 (two) times daily. 1 mL 12   ezetimibe  (ZETIA ) 10 MG tablet TAKE 1 TABLET ONCE DAILY 90 tablet 3   feeding supplement (BOOST HIGH PROTEIN) LIQD Take 1 Container by mouth in the morning.     ferrous sulfate  325 (65 FE) MG EC tablet Take 1 tablet (325 mg total) by mouth daily with breakfast. 30 tablet 0   FLUoxetine  (PROZAC ) 20 MG capsule Take 1 capsule (20 mg total) by mouth daily. 90 capsule 3   fluticasone  (FLONASE ) 50 MCG/ACT nasal spray SHAKE LIQUID AND USE 2 SPRAYS IN EACH NOSTRIL DAILY AS NEEDED FOR RHINITIS OR ALLERGIES 16 g 11   furosemide  (LASIX ) 20 MG tablet Take 1 tablet (20 mg total) by mouth daily as needed for fluid or edema. 30 tablet 3   gabapentin  (NEURONTIN ) 300 MG capsule TAKE 1 CAPSULE(300 MG) BY MOUTH TWICE DAILY 60 capsule 5   guaiFENesin   (MUCINEX ) 600 MG 12 hr tablet Take 600 mg by mouth daily.     hydrocortisone  cream 0.5 % Apply 1 Application topically as needed for itching.     hydrOXYzine  (ATARAX ) 10 MG tablet Take 1-2 tablets (10-20 mg total) by mouth at bedtime as needed for itching (or sleep). 30 tablet 3   ipratropium (ATROVENT ) 0.02 % nebulizer solution Use 1 vial (0.5 mg total) by nebulization every 6 (six) hours as needed for wheezing or shortness of breath. 75 mL 12   mirtazapine  (REMERON ) 7.5 MG tablet TAKE 1 TABLET(7.5 MG) BY MOUTH AT BEDTIME 30 tablet 3  morphine  (MS CONTIN ) 15 MG 12 hr tablet Take 1 tablet (15 mg total) by mouth every 12 (twelve) hours. G89.21- chronic pain due to polymyalgia rheumatica and chronic gout- can fill now 60 tablet 0   morphine  (MS CONTIN ) 15 MG 12 hr tablet Take 1 tablet (15 mg total) by mouth every 12 (twelve) hours. Fill in 28 days after last fill G89.21- chronic pain due to polymyalgia rheumatica and chronic gout- 60 tablet 0   naloxone  (NARCAN ) nasal spray 4 mg/0.1 mL For possible accidentally overdose. 2 each 5   oxyCODONE  10 MG TABS Take 1 tablet (10 mg total) by mouth every 4 (four) hours as needed for severe pain (pain score 7-10). G89.3 chronic pain due to cancer- can fill now 150 tablet 0   Oxycodone  HCl 10 MG TABS Take 1 tablet (10 mg total) by mouth every 4 (four) hours as needed. Max 5 pills/day-  G89.3 chronic pain due to cancer- fill in 28 days 150 tablet 0   pantoprazole  (PROTONIX ) 40 MG tablet Take 1 tablet (40 mg total) by mouth daily. 90 tablet 3   potassium chloride  (KLOR-CON ) 10 MEQ tablet TAKE 1 TABLET(10 MEQ) BY MOUTH FOUR TIMES DAILY 120 tablet 3   predniSONE  (DELTASONE ) 1 MG tablet Take 3 tablets (3 mg total) by mouth daily with breakfast. (Along with a 5mg  tablet) 90 tablet 0   predniSONE  (DELTASONE ) 5 MG tablet Take 1 tablet (5 mg total) by mouth daily with breakfast. 30 tablet 0   tamsulosin  (FLOMAX ) 0.4 MG CAPS capsule Take 1 capsule (0.4 mg total) by mouth  daily. 90 capsule 3   Tiotropium Bromide  Monohydrate (SPIRIVA  RESPIMAT) 2.5 MCG/ACT AERS Inhale 2 puffs into the lungs daily. (Patient taking differently: Inhale 2 puffs into the lungs daily as needed (shortness of breath).) 4 g 6   traZODone  (DESYREL ) 50 MG tablet TAKE 1/2 TO 1 TABLET(25 TO 50 MG) BY MOUTH AT BEDTIME AS NEEDED FOR SLEEP 30 tablet 3   triamcinolone  cream (KENALOG ) 0.1 % Apply 1 Application topically 2 (two) times daily as needed (to itchy sites).     trolamine salicylate (ASPERCREME) 10 % cream Apply 1 Application topically 2 (two) times daily as needed (arthritis pain).     zoledronic  acid (RECLAST ) 5 MG/100ML SOLN injection Inject 5 mg into the vein once. 09/11/2022 and 10/08/2023     No facility-administered medications prior to visit.   Allergies  Allergen Reactions   Tape Other (See Comments)    Tears skin. Prefers paper tape, Please   Celebrex [Celecoxib] Hives and Itching   Objective:   Today's Vitals   01/08/24 1059  BP: 122/74  Pulse: 69  Temp: 97.9 F (36.6 C)  TempSrc: Temporal  SpO2: 97%  Weight: 140 lb (63.5 kg)  Height: 5' 8.5 (1.74 m)   Body mass index is 20.98 kg/m.   General: Well developed, well nourished. No acute distress. HEENT: Normocephalic, non-traumatic. PERRL, EOMI. Conjunctiva clear. External ears normal. EAC impacted   with wax bilaterally. Nose with mild congestion, but no rhinorrhea. No pain on percussion over sinuses. Mucous   membranes moist. Post-surgical changes noted. No palpable masses in soft tissues of left tongue or buccal   mucosa. Mild redness or posterior oropharynx. Good dentition. Neck: Supple. No lymphadenopathy. No thyromegaly. Lungs: Mild coarsening in lower lung fields. No wheezing, rales or rhonchi. CV: RRR without murmurs or rubs. Pulses 2+ bilaterally. Psych: Alert and oriented. Normal mood and affect.  Health Maintenance Due  Topic Date Due  Zoster Vaccines- Shingrix (1 of 2) Never done   Colonoscopy   03/22/2020   COVID-19 Vaccine (4 - 2025-26 season) 10/07/2023     Chest x-ray: Clearing of previous RUL infiltrate.  Assessment & Plan:   Problem List Items Addressed This Visit       Cardiovascular and Mediastinum   Chronic diastolic heart failure (HCC)   Compensated. Continue carvedilol  12.5 mg bid.      Coronary artery disease   No angina. Continue aspirin  81 mg daily and carvedilol  12.5 mg bid.      Essential hypertension   Blood pressure is in good control. Continue amlodipine  10 mg daily and carvedilol  12.5 mg twice daily.        Respiratory   Chronic allergic rhinitis   Continue daily nasal steroid and use of nasal saline washes.      Chronic obstructive pulmonary disease (HCC)   Oxygen saturation is normal. Continue tiotropium daily and albuterol  as needed. Encourage ongoing abstinence form smoking.        Musculoskeletal and Integument   Primary osteoarthritis, left shoulder   Plan for reverse total shoulder joint replacement. Recommend he reach out to Dr. Kay concerning rescheduling of this.        Other   Preoperative examination - Primary   Mr. Minotti is at moderate risk for complications. Prior recommendations regarding surgical risk remain. He is clear form his recent pneumonia, so this should no longer be a barrier to proceeding with surgery.      Other Visit Diagnoses       Pneumonia of right upper lobe due to infectious organism       Resolved.   Relevant Orders   DG Chest 2 View      Return in about 3 months (around 04/07/2024).    Garnette CHRISTELLA Simpler, MD  I,Emily Lagle,acting as a scribe for Garnette CHRISTELLA Simpler, MD.,have documented all relevant documentation on the behalf of Garnette CHRISTELLA Simpler, MD.  I, Garnette CHRISTELLA Simpler, MD, have reviewed all documentation for this visit. The documentation on 01/08/2024 for the exam, diagnosis, procedures, and orders are all accurate and complete.

## 2024-01-08 NOTE — Assessment & Plan Note (Signed)
 [name] is at moderate risk for complications. Prior recommendations regarding surgical risk remain. He is clear form his recent pneumonia, so this should no longer be a barrier to proceeding with surgery.

## 2024-01-08 NOTE — Assessment & Plan Note (Signed)
 Compensated. Continue carvedilol 12.5 mg bid.

## 2024-01-08 NOTE — Assessment & Plan Note (Signed)
 Oxygen saturation is normal. Continue tiotropium daily and albuterol  as needed. Encourage ongoing abstinence form smoking.

## 2024-01-14 ENCOUNTER — Telehealth (HOSPITAL_BASED_OUTPATIENT_CLINIC_OR_DEPARTMENT_OTHER): Payer: Self-pay | Admitting: *Deleted

## 2024-01-14 NOTE — Telephone Encounter (Signed)
 Will send message to heart failure clinic to call pt with an appt for preop clearance per preop APP.

## 2024-01-14 NOTE — Telephone Encounter (Signed)
   Name: Brandon [name]  DOB: 11-Mar-1940  MRN: [id number]  Primary Cardiologist: Ezra Shuck, MD  Chart reviewed as part of pre-operative protocol coverage. Because of [name]'s past medical history and time since last visit, he will require a follow-up in-office visit in order to better assess preoperative cardiovascular risk.  Patient is overdue for follow-up with advanced heart failure clinic.  He will require an office visit with advanced heart failure clinic in order for preoperative cardiac evaluation to be completed.  Pre-op covering staff: - Please schedule appointment and call patient to inform them. If patient already had an upcoming appointment within acceptable timeframe, please add pre-op clearance to the appointment notes so provider is aware. - Please contact requesting surgeon's office via preferred method (i.e, phone, fax) to inform them of need for appointment prior to surgery.  Waller Marcussen D Keasia Dubose, NP  01/14/2024, 4:26 PM

## 2024-01-14 NOTE — Telephone Encounter (Signed)
   Pre-operative Risk Assessment    Patient Name: Brandon [name]  DOB: [birth year] MRN: [id number]   Date of last office visit: 06/12/23 [facility] FAILURE CLINIC Date of next office visit: NONE   Request for Surgical Clearance    Procedure:  LEFT REVERSE TOTAL SHOULDER ARTHROPLASTY  Date of Surgery:  Clearance 03/20/24                                Surgeon:  DR. ELSPETH HER  Surgeon's Group or Practice Name:  [facility] Phone number:  [phone] MEGAN DAVIS Fax number:  [phone]   Type of Clearance Requested:   - Medical  - Pharmacy:  Hold Aspirin      Type of Anesthesia:  CHOICE   Additional requests/questions:    Bonney Niels Jest   01/14/2024, 4:15 PM

## 2024-01-15 NOTE — Telephone Encounter (Signed)
 Pt has appt 01/29/24 [facility] Failure Clinic for preop clearance.

## 2024-01-17 ENCOUNTER — Telehealth: Payer: Self-pay

## 2024-01-17 NOTE — Telephone Encounter (Signed)
 Received fax from [facility] requesting surgical clearance.    Dr. Dolphus signed clearance form stating cleared from a rheumatology point of view only   Clearance has been faxed to [facility].

## 2024-01-23 ENCOUNTER — Other Ambulatory Visit: Payer: Self-pay | Admitting: Nurse Practitioner

## 2024-01-23 DIAGNOSIS — J441 Chronic obstructive pulmonary disease with (acute) exacerbation: Secondary | ICD-10-CM

## 2024-01-28 ENCOUNTER — Telehealth (HOSPITAL_COMMUNITY): Payer: Self-pay

## 2024-01-28 NOTE — Telephone Encounter (Signed)
 Called to confirm/remind patient of their appointment at the [facility] on 01/29/24.   Appointment:   [] Confirmed  [x] Left mess   [] No answer/No voice mail  [] VM Full/unable to leave message  [] Phone not in service  And  to bring in all medications and/or complete list.

## 2024-01-29 ENCOUNTER — Encounter (HOSPITAL_COMMUNITY): Payer: Self-pay

## 2024-01-29 ENCOUNTER — Ambulatory Visit (HOSPITAL_COMMUNITY)
Admission: RE | Admit: 2024-01-29 | Discharge: 2024-01-29 | Disposition: A | Discharge status: Home / Self Care | Source: Ambulatory Visit | Attending: Adult Health | Admitting: Adult Health

## 2024-01-29 VITALS — BP 122/64 | HR 69 | Ht 68.5 in | Wt 135.6 lb

## 2024-01-29 DIAGNOSIS — I10 Essential (primary) hypertension: Secondary | ICD-10-CM | POA: Diagnosis present

## 2024-01-29 DIAGNOSIS — Z96642 Presence of left artificial hip joint: Secondary | ICD-10-CM | POA: Insufficient documentation

## 2024-01-29 DIAGNOSIS — I6523 Occlusion and stenosis of bilateral carotid arteries: Secondary | ICD-10-CM | POA: Insufficient documentation

## 2024-01-29 DIAGNOSIS — J449 Chronic obstructive pulmonary disease, unspecified: Secondary | ICD-10-CM | POA: Diagnosis not present

## 2024-01-29 DIAGNOSIS — R0789 Other chest pain: Secondary | ICD-10-CM | POA: Diagnosis present

## 2024-01-29 DIAGNOSIS — Z79899 Other long term (current) drug therapy: Secondary | ICD-10-CM | POA: Insufficient documentation

## 2024-01-29 DIAGNOSIS — I5032 Chronic diastolic (congestive) heart failure: Secondary | ICD-10-CM | POA: Diagnosis not present

## 2024-01-29 DIAGNOSIS — Z952 Presence of prosthetic heart valve: Secondary | ICD-10-CM | POA: Diagnosis not present

## 2024-01-29 DIAGNOSIS — E785 Hyperlipidemia, unspecified: Secondary | ICD-10-CM | POA: Diagnosis not present

## 2024-01-29 DIAGNOSIS — I2510 Atherosclerotic heart disease of native coronary artery without angina pectoris: Secondary | ICD-10-CM | POA: Diagnosis not present

## 2024-01-29 DIAGNOSIS — Z87891 Personal history of nicotine dependence: Secondary | ICD-10-CM | POA: Insufficient documentation

## 2024-01-29 DIAGNOSIS — M353 Polymyalgia rheumatica: Secondary | ICD-10-CM | POA: Insufficient documentation

## 2024-01-29 DIAGNOSIS — I110 Hypertensive heart disease with heart failure: Secondary | ICD-10-CM | POA: Insufficient documentation

## 2024-01-29 NOTE — Progress Notes (Signed)
 Patient ID: Brandon [name], male   DOB: [birth year], 83 y.o.   MRN: [id number] PCP: Velma Lunger Cardiology: Dr. Rolan  83 y.o.with history of CAD, HTN, and hyperlipidemia presents for followup.  Patient had described some exertional chest pain and myoview showed a partially reversible inferior defect, so LHC was done in 7/10.  This showed that his proximal RCA was totally occluded.  There were very robust left to right collaterals.  There was also a 50% proximal LAD stenosis.  The patient was managed medically.    Most recent echo in 5/20 showed EF 65-70% with moderate AS and mild AI.     Earlier in 2018, he developed a Strep intermedius liver abscess.  He had a hepatic drain placed and also had a right pleural effusion requiring diuresis.    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  He is now cancer-free.    Abdominal US  in 10/20 showed AAA up to 4.8 cm. Abdominal US  in 2/22 showed stable 4.8 cm AAA.   Echo in 2/22 showed EF 60-65% with mild LVH, normal RV, PASP 45 mmHg, severe AS with mean gradient 50 mmHg and AVA 0.96 cm^2. RHC/LHC was done in preparation for AVR, this showed 95% pLAD stenosis, 60-70% pLCx, and CTO RCA with collaterals.  FFR was negative for LCx, patient had DES to LAD.   Patient had TAVR in 5/22 with 29 mm Edwards Sapien THV.  Echo in 7/22 showed hyperdynamic LV with EF 70-75%, normal RV, bioprosthetic aortic valve with mild AI and higher mean gradient 16 mmHg.  Echo in 10/22 showed EF 60-65%, moderate asymmetric basal septal hypertrophy, normal RV, normal IVC, bioprosthetic AoV s/p TAVR with mean gradient 16 and trivial PVL.   In 10/22, patient had mechanical fall (tripped) and fractured his left femoral neck.  He had left THR in 10/22.     He underwent an abdominal aorta endovascular stent graft on 03/03/2021. He notes that it was discovered afterwards that he did not have good flow through the left iliac. 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   Admitted 05/23 with AKI any myoclonic jerking. Scr peaked at 7.35, improved to 1.13 at discharge. Etiology felt to be hypovolemia d/t recent N, V, D and possible urinary obstruction. Neurologic workup unrevealing. Had anemia requiring 1 u RBCs. Hgb 9.5 at discharge.   Readmitted 07/23 with severe sepsis/CAP and AKI. Developed CP after choking on pills. Some concern for possible aspiration d/t prior XRT and throat surgeries for cancer. HS troponin 276 > 45 in setting of PNA. Limited echo with preserved EF. Cardiology consulted. No further ischemic workup recommended. Course c/b acute on chronic anemia. Hgb as low as 7.5. + FOBT. Iron  stores low. Outpatient GI f/u recommended.   Last seen 09/2021.  Today he returns for HF follow up. Needs Left Shoulder replacement 02/18/24. Overall feeling fine. Complaining of congestion. Having a little bit of dyspnea with exertion. Says he did not use his inhaler this morning. Denies PND/Orthopnea. Denies chest pain.  Appetite poor. No fever or chills.  Taking all medications.  Labs (7/23): K 4.3, creatinine 0.73, hgb 10.2 Labs (8/23): LDL 71 Labs (12/24): K 3.7, creatinine 0.66  Past Medical History: 1. Hyperlipidemia: Myalgias with atorvastatin  and Crestor. 2. Aortic stenosis.  Echocardiogram in August 2009 showed EF 60%, no regional wall motion abnormalities, mild LVH, moderate focal basal septal hypertrophy, and mild diastolic dysfunction.  There was a very mild aortic stenosis with partially fused left and right coronary cusps and some restricted motion of the aortic valve.  The mean gradient, however, across the aortic valve was only 8 mmHg.  There was also mild left atrial enlargement and normal RV size and function.  Minimal AS on LHC in 7/10.  Echo (7/14) with EF 60-65%, mild LVH, mild AS mean gradient 13 mmHg.  Echo (6/16) with EF 55-60%, mild AS with mean gradient 14 mmHg.   - Echo (6/18): EF 55-60%, mild LVH, moderate AS mean gradient 27 mmHg, mild AI.   - Echo (5/20, [facility]): EF 65-70%, moderate AS, mild AI.  - Echo (2/22): EF 60-65% with mild LVH, normal RV, PASP 45 mmHg, severe AS with mean gradient 50 mmHg and AVA 0.96 cm^2.  - TAVR with 29 mm Edwards Sapien THV in 5/22.  - Echo (7/22): Hyperdynamic LV with EF 70-75%, normal RV, bioprosthetic aortic valve with mild AI and higher mean gradient 16 mmHg. - Echo (10/22): EF 60-65%, moderate asymmetric basal septal hypertrophy, normal RV, normal IVC, bioprosthetic AoV s/p TAVR with mean gradient 16 and trivial PVL. - Echo (06/23): EF 65-70%, RV okay, mean gradient of 8 mmHg across aortic valve prosthesis, mild perivalvular leak - Limited echo (7/23): EF 65-70% 3. Hypertension. 4. Gastroesophageal reflux disease: h/o esophageal stricture.  5. BPH. 6. Diverticulosis. 7. Smoked until 7/10. 8. CAD: LHC (7/10) showed totally occluded proximal RCA with very robust left to right collaterals, 50% proximal LAD stenosis, EF 65%.   - LHC (2/22): 95% stenosis proximal LAD, 60-70% proximal LCx, totally occluded RCA with collaterals.  Negative FFR of LCx, patient had DES to LAD.   9. Osteoarthritis 10. Colonic polyps 11. h/o TIA 12. 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  This manifested with slurred speech. 13. History of low back pain.  The patient is status post surgical back fusion. 14. History of cholecystectomy.  15. Cataract surgery 16. Prostate cancer: treated 17. Liver abscess: Strep intermedius, 2018.  18. AAA: Noted in 8/18 on CT abdomen, 4.3 cm.  - Abdominal US  (10/20): 4.8 cm AAA.  - Abdominal US  (2/22): 4.8 cm AAA - CT abdomen 3/22 with 5 cm AAA - CT abdomen (10/22): 5.2 cm AAA - s/p AAA repair 1/23 with stent graft 19. Carotid stenosis: carotid dopplers 10/18 with 40-59% RICA stenosis.  - Carotid dopplers (10/20): 50-69% RICA stenosis.   - Carotid dopplers (2/22): Mild BICA stenosis.  20.  Tonsillar squamous cell carcinoma: 2019, treated with resection and radiation.  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  22. Gout 23. Mechanical fall with left femoral neck fracture and left THR in 10/22.  24. GI bleeding with Fe deficiency anemia.   Family History: Father alive w/ CAD & CABG at [facility] in 2009, had TAVR as well.  Mother died age 64 w/ Alzheimer's disease, hx of DJD 1 Sibling- Brother alive & well w/ DJD in his hips  Social History: Married- 3rd marriage, wife= Etta 2 children from 1st marriage, 4 step children Former smoker, 40+yrs of >1ppd, quit 7/10. Restarted, then quit again in 4/22.  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   Current Outpatient Medications  Medication Sig Dispense Refill   acetaminophen  (TYLENOL ) 500 MG tablet Take 1,000 mg by mouth every 8 (eight) hours as needed for mild pain or  headache.     albuterol  (VENTOLIN  HFA) 108 (90 Base) MCG/ACT inhaler INHALE 2 PUFFS INTO LUNGS EVERY 6 HOURS AS NEEDED FOR WHEEZING OR SHORTNESS OF BREATH 8.5 g 1   allopurinol  (ZYLOPRIM ) 100 MG tablet TAKE 2 TABLETS(200 MG) BY MOUTH DAILY 180 tablet 0   amLODipine  (NORVASC ) 10 MG tablet Take 1 tablet (10 mg total) by mouth daily. 90 tablet 3   aspirin  EC 81 MG tablet Take 81 mg by mouth at bedtime. Swallow whole.     atorvastatin  (LIPITOR ) 80 MG tablet Take 1 tablet (80 mg total) by mouth daily. 90 tablet 3   benzonatate  (TESSALON ) 100 MG capsule Take 1 capsule (100 mg total) by mouth 3 (three) times daily as needed. 30 capsule 0   carvedilol  (COREG ) 12.5 MG tablet Take 1 tablet (12.5 mg total) by mouth 2 (two) times daily. 180 tablet 3   cyanocobalamin  (VITAMIN B12) 1000 MCG tablet Take 1 tablet (1,000 mcg total) by mouth daily. 90 tablet 3   dorzolamide -timolol  (COSOPT ) 2-0.5 % ophthalmic solution Place 1  drop into the left eye 2 (two) times daily. 1 mL 12   ezetimibe  (ZETIA ) 10 MG tablet TAKE 1 TABLET ONCE DAILY 90 tablet 3   feeding supplement (BOOST HIGH PROTEIN) LIQD Take 1 Container by mouth in the morning.     ferrous sulfate  325 (65 FE) MG EC tablet Take 1 tablet (325 mg total) by mouth daily with breakfast. 30 tablet 0   FLUoxetine  (PROZAC ) 20 MG capsule Take 1 capsule (20 mg total) by mouth daily. 90 capsule 3   fluticasone  (FLONASE ) 50 MCG/ACT nasal spray SHAKE LIQUID AND USE 2 SPRAYS IN EACH NOSTRIL DAILY AS NEEDED FOR RHINITIS OR ALLERGIES 16 g 11   furosemide  (LASIX ) 20 MG tablet Take 1 tablet (20 mg total) by mouth daily as needed for fluid or edema. 30 tablet 3   gabapentin  (NEURONTIN ) 300 MG capsule TAKE 1 CAPSULE(300 MG) BY MOUTH TWICE DAILY 60 capsule 5   guaiFENesin  (MUCINEX ) 600 MG 12 hr tablet Take 600 mg by mouth daily.     hydrocortisone  cream 0.5 % Apply 1 Application topically as needed for itching.     hydrOXYzine  (ATARAX ) 10 MG tablet Take 1-2 tablets (10-20 mg total) by mouth at bedtime as needed for itching (or sleep). 30 tablet 3   ipratropium (ATROVENT ) 0.02 % nebulizer solution Use 1 vial (0.5 mg total) by nebulization every 6 (six) hours as needed for wheezing or shortness of breath. 75 mL 12   mirtazapine  (REMERON ) 7.5 MG tablet TAKE 1 TABLET(7.5 MG) BY MOUTH AT BEDTIME 30 tablet 3   morphine  (MS CONTIN ) 15 MG 12 hr tablet Take 1 tablet (15 mg total) by mouth every 12 (twelve) hours. Fill in 28 days after last fill G89.21- chronic pain due to polymyalgia rheumatica and chronic gout- 60 tablet 0   naloxone  (NARCAN ) nasal spray 4 mg/0.1 mL For possible accidentally overdose. 2 each 5   Oxycodone  HCl 10 MG TABS Take 1 tablet (10 mg total) by mouth every 4 (four) hours as needed. Max 5 pills/day-  G89.3 chronic pain due to cancer- fill in 28 days 150 tablet 0   pantoprazole  (PROTONIX ) 40 MG tablet Take 1 tablet (40 mg total) by mouth daily. 90 tablet 3   potassium  chloride (KLOR-CON ) 10 MEQ tablet TAKE 1 TABLET(10 MEQ) BY MOUTH FOUR TIMES DAILY 120 tablet 3   tamsulosin  (FLOMAX ) 0.4 MG CAPS capsule Take 1 capsule (0.4 mg total) by mouth  daily. 90 capsule 3   Tiotropium Bromide  Monohydrate (SPIRIVA  RESPIMAT) 2.5 MCG/ACT AERS Inhale 2 puffs into the lungs daily. (Patient taking differently: Inhale 2 puffs into the lungs daily as needed (shortness of breath).) 4 g 6   traZODone  (DESYREL ) 50 MG tablet TAKE 1/2 TO 1 TABLET(25 TO 50 MG) BY MOUTH AT BEDTIME AS NEEDED FOR SLEEP 30 tablet 3   triamcinolone  cream (KENALOG ) 0.1 % Apply 1 Application topically 2 (two) times daily as needed (to itchy sites).     trolamine salicylate (ASPERCREME) 10 % cream Apply 1 Application topically 2 (two) times daily as needed (arthritis pain).     zoledronic  acid (RECLAST ) 5 MG/100ML SOLN injection Inject 5 mg into the vein once. 09/11/2022 and 10/08/2023     oxyCODONE  10 MG TABS Take 1 tablet (10 mg total) by mouth every 4 (four) hours as needed for severe pain (pain score 7-10). G89.3 chronic pain due to cancer- can fill now 150 tablet 0   No current facility-administered medications for this encounter.   Wt Readings from Last 3 Encounters:  01/29/24 61.5 kg (135 lb 9.6 oz)  01/08/24 63.5 kg (140 lb)  01/06/24 63.5 kg (140 lb)   BP 122/64   Pulse 69   Ht 5' 8.5 (1.74 m)   Wt 61.5 kg (135 lb 9.6 oz)   SpO2 96%   BMI 20.32 kg/m  General:   No resp difficulty Neck: no JVD.  Cor: Regular rate & rhythm.  Lungs: Coarse throughout.  Abdomen: soft, nontender, nondistended.  Extremities: no  edema Neuro: alert & oriented x3  Assessment/Plan: 1. Aortic stenosis: Patient had TAVR 06/07/20 by left subclavian access given presence of AAA with 29 mm Edwards Sapien THV.  Echo in 10/22 with stable mean gradient 16 mmHg and trivial PVL.  - Echo 06/23: EF 65-70%, RV okay, mean gradient of 8 mmHg across aortic valve prosthesis, mild perivalvular leak - Valve appears to function normally.   - set up repeat Echo . 2. CAD: LHC in 2/22 with CTO RCA with collaterals, 95% pLAD treated with DES, 60-70% proximal LCx (FFR negative).  No chest pain.   - Continue atorvastatin   Continue ASA 81. 3. Hyperlipidemia:  Continue atorvastatin .  4. HTN: Stable today.  -  Continue amlodipine  10 mg daily.  - Continue Coreg  12.5 mg bid.  - Off lisinorpil.    5. Abdominal aortic aneurysm: 5.2 cm by CT in 10/22.   - s/p AAA repair with Dr. Sheree 1/23. - No evidence of endoleak on US  04/23 6. Carotid stenosis: Mild by 2/22 dopplers.  7. Smoking: He has quit.   - No change. 8. Chronic diastolic CHF:  Limitations seem to be due to joint issues/COPD. He is not volume overloaded.  Appears euvolemic. Continue lasix   9. Polymyalgia rheumatica: Continues with joint pain.  He is on prednisone  & followed by Rheum.  Will discuss preop clearance with Dr Rolan.  Follow up in 6 months.     Emmogene Simson NP-C  01/29/2024

## 2024-01-29 NOTE — Patient Instructions (Signed)
 Medication Changes:  None, continue current medications  Special Instructions // Education:  Do the following things EVERYDAY: Weigh yourself in the morning before breakfast. Write it down and keep it in a log. Take your medicines as prescribed Eat low salt foods--Limit salt (sodium) to 2000 mg per day.  Stay as active as you can everyday Limit all fluids for the day to less than 2 liters   Follow-Up in: 6 months (June 2026), **please call our office in April to schedule this appointment   At the [facility], you and your health needs are our priority. We have a designated team specialized in the treatment of Heart Failure. This Care Team includes your primary Heart Failure Specialized Cardiologist (physician), Advanced Practice Providers (APPs- Physician Assistants and Nurse Practitioners), and Pharmacist who all work together to provide you with the care you need, when you need it.   You may see any of the following providers on your designated Care Team at your next follow up:  Dr. Toribio Fuel Dr. Ezra Shuck Dr. Odis Brownie Greig Mosses, NP Caffie Shed, [place] [facility] [place], [place] Beckey Coe, NP Jordan Lee, NP Tinnie Redman, PharmD   Please be sure to bring in all your medications bottles to every appointment.   Need to Contact Us :  If you have any questions or concerns before your next appointment please send us  a message through [place] or call our office at [phone].    TO LEAVE A MESSAGE FOR THE NURSE SELECT OPTION 2, PLEASE LEAVE A MESSAGE INCLUDING: YOUR NAME DATE OF BIRTH CALL BACK NUMBER REASON FOR CALL**this is important as we prioritize the call backs  YOU WILL RECEIVE A CALL BACK THE SAME DAY AS LONG AS YOU CALL BEFORE 4:00 PM

## 2024-02-03 ENCOUNTER — Telehealth: Payer: Self-pay | Admitting: Physical Medicine and Rehabilitation

## 2024-02-03 NOTE — Telephone Encounter (Signed)
 Pt came to the office and said he want to speak with you before going for his surgery. He asked that you call him to discuss [phone]. His pain meds Oxycodone  and morphine  runs out Jan 1.

## 2024-02-04 MED ORDER — OXYCODONE HCL 10 MG PO TABS: 10.0000 mg | ORAL_TABLET | ORAL | 0 refills | Status: DC | PRN

## 2024-02-04 MED ORDER — MORPHINE SULFATE ER 15 MG PO TBCR: 15.0000 mg | EXTENDED_RELEASE_TABLET | Freq: Two times a day (BID) | ORAL | 0 refills | Status: DC

## 2024-02-07 ENCOUNTER — Ambulatory Visit: Admitting: Family Medicine

## 2024-02-26 NOTE — H&P (Signed)
 " Patient's anticipated LOS is less than 2 midnights, meeting these requirements: - Younger than 58 - Lives within 1 hour of care - Has a competent adult at home to recover with post-op recover - NO history of  - Chronic pain requiring opiods  - Diabetes  - Coronary Artery Disease  - Heart failure  - Heart attack  - Stroke  - DVT/VTE  - Cardiac arrhythmia  - Respiratory Failure/COPD  - Renal failure  - Anemia  - Advanced Liver disease     Brandon [name] is an 84 y.o. male.    Chief Complaint: left shoulder pain   HPI: Pt is a 85 y.o. male complaining of left shoulder pain for multiple years. Pain had continually increased since the beginning. X-rays in the clinic show end-stage arthritic changes of the left shoulder. Pt has tried various conservative treatments which have failed to alleviate their symptoms, including injections and therapy. Various options are discussed with the patient. Risks, benefits and expectations were discussed with the patient. Patient understand the risks, benefits and expectations and wishes to proceed with surgery.   PCP:  Thedora Garnette HERO, MD  D/C Plans: Home  PMH: Past Medical History:  Diagnosis Date   Abdominal aneurysm    Arthritis    all over (07/19/2016)   BPH (benign prostatic hypertrophy)    CAD (coronary artery disease)    Chicken pox    Chronic lower back pain    s/p surgical fusion   COPD (chronic obstructive pulmonary disease) (HCC)    Depression    Diverticulosis    Esophageal stricture    GERD (gastroesophageal reflux disease)    Heart murmur    Hepatitis B 1984   Hiatal hernia    History of radiation therapy 01/16/18- 03/05/18   Left Tonsil, 66 Gy in 33 fractions to high risk nodal echelons.    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  Wonda and Dr Lucas    Severe aortic stenosis    Stroke [facility])    TIA - 20 years ago   TIA (transient ischemic attack) 1990s   hx   tonsillar ca dx'd 11/2017   Tubular adenoma of colon 2009    PSH: Past Surgical History:  Procedure Laterality Date   ABDOMINAL AORTIC ENDOVASCULAR STENT GRAFT N/A 03/03/2021   Procedure: ABDOMINAL AORTIC ENDOVASCULAR STENT GRAFT;  Surgeon: Sheree Penne Bruckner, MD;  Location: [facility] OR;  Service: Vascular;  Laterality: N/A;   ABDOMINAL AORTOGRAM W/LOWER EXTREMITY N/A 04/10/2021   Procedure: ABDOMINAL AORTOGRAM W/LOWER EXTREMITY;  Surgeon: Sheree Penne Bruckner, MD;  Location: [facility] INVASIVE CV LAB;  Service: Cardiovascular;  Laterality: N/A;   BACK SURGERY     CARDIAC CATHETERIZATION     CATARACT EXTRACTION W/ INTRAOCULAR LENS  IMPLANT, BILATERAL Bilateral 01/2012 - 02/2012   CORONARY ATHERECTOMY N/A 04/27/2020   Procedure: CORONARY ATHERECTOMY;  Surgeon: Wonda Sharper, MD;  Location: [facility] INVASIVE CV LAB;  Service: Cardiovascular;  Laterality: N/A;   CORONARY IMAGING/OCT N/A 04/27/2020   Procedure: INTRAVASCULAR IMAGING/OCT;  Surgeon: Wonda Sharper, MD;  Location: [facility] INVASIVE CV LAB;  Service: Cardiovascular;  Laterality: N/A;   CORONARY PRESSURE/FFR STUDY N/A 04/27/2020   Procedure: INTRAVASCULAR PRESSURE WIRE/FFR STUDY;  Surgeon: Wonda Sharper, MD;  Location: [facility] INVASIVE CV LAB;  Service: Cardiovascular;  Laterality: N/A;   CORONARY STENT INTERVENTION N/A 04/27/2020   Procedure: CORONARY STENT INTERVENTION;  Surgeon: Wonda Sharper, MD;  Location: [facility] INVASIVE CV LAB;  Service: Cardiovascular;  Laterality: N/A;   DIRECT LARYNGOSCOPY Left 10/09/2017   Procedure: DIRECT LARYNGOSCOPY WITH BOPSY;  Surgeon: Arlana Arnt, MD;  Location: [facility];  Service: ENT;  Laterality: Left;   ESOPHAGOGASTRODUODENOSCOPY (EGD) WITH ESOPHAGEAL DILATION     couple times (07/19/2016)   ESOPHAGOSCOPY Left 10/09/2017   Procedure: ESOPHAGOSCOPY;   Surgeon: Arlana Arnt, MD;  Location: [facility];  Service: ENT;  Laterality: Left;   IR GASTROSTOMY TUBE MOD SED  01/08/2018   IR THORACENTESIS RIGHT ASP PLEURAL SPACE W/IMG GUIDE  07/19/2016   LAPAROSCOPIC CHOLECYSTECTOMY  1984   LUMBAR DISC SURGERY  05/1996   L4-5; Dr. Alix ILES LAMINECTOMY/DECOMPRESSION MICRODISCECTOMY  10/2002   L3-4. Dr. Alix   MULTIPLE TOOTH EXTRACTIONS  1980s   PARTIAL GLOSSECTOMY  12/02/2017   Dr. Lauralee- [facility]   pharyngoplasty for closure of tingue base defect  12/02/2017   Dr. Lauralee- [facility]   PICC LINE INSERTION  07/15/2016   POSTERIOR LUMBAR FUSION  09/1996   Ray cage, L4-5 Dr. Mora   PROSTATE BIOPSY  ~ 2017   radical tonsillectomy Left 12/02/2017   Dr. Lauralee at [facility]   RIGHT HEART CATH AND CORONARY ANGIOGRAPHY N/A 03/31/2020   Procedure: RIGHT HEART CATH AND CORONARY ANGIOGRAPHY;  Surgeon: Rolan Ezra RAMAN, MD;  Location: [facility] INVASIVE CV LAB;  Service: Cardiovascular;  Laterality: N/A;   RIGID BRONCHOSCOPY Left 10/09/2017   Procedure: RIGID BRONCHOSCOPY;  Surgeon: Arlana Arnt, MD;  Location:  SURGERY CENTER;  Service: ENT;  Laterality: Left;   TEE WITHOUT CARDIOVERSION N/A 06/07/2020   Procedure: TRANSESOPHAGEAL ECHOCARDIOGRAM (TEE);  Surgeon: Wonda Sharper, MD;  Location: [facility] OR;  Service: Open Heart Surgery;  Laterality: N/A;   TONSILLECTOMY     TOTAL HIP ARTHROPLASTY Left 12/04/2020   Procedure: TOTAL HIP ARTHROPLASTY ANTERIOR APPROACH;  Surgeon: Fidel Rogue, MD;  Location: [facility] ORS;  Service: Orthopedics;  Laterality: Left;   TOTAL HIP ARTHROPLASTY Left 11/2020   TRACHEOSTOMY  12/02/2017   Dr. [facility]alee- [facility]   ULTRASOUND GUIDANCE FOR VASCULAR ACCESS Right 06/07/2020   Procedure: ULTRASOUND GUIDANCE FOR VASCULAR ACCESS;  Surgeon: Wonda Sharper, MD;  Location: [facility] OR;  Service: Open Heart Surgery;  Laterality: Right;   ULTRASOUND GUIDANCE FOR VASCULAR ACCESS Bilateral 03/03/2021   Procedure:  ULTRASOUND GUIDANCE FOR VASCULAR ACCESS, BILATERAL FEMORAL ARTERIES;  Surgeon: Sheree Penne Bruckner, MD;  Location: [facility] OR;  Service: Vascular;  Laterality: Bilateral;   VASCULAR SURGERY      Social History:  reports that he quit smoking about 6 years ago. His smoking use included cigarettes. He started smoking about 56 years ago. He has a 75 pack-year smoking history. He has never been exposed to tobacco smoke. He has never used smokeless tobacco. He reports that he does not currently use alcohol . He reports current drug use. Drugs: Morphine  and Oxycodone . BMI: Estimated body mass index is 20.32 kg/m as calculated from the following:   Height as of 01/29/24: 5' 8.5 (1.74 m).   Weight as of 01/29/24: 61.5 kg.  Lab Results  Component Value Date   ALBUMIN  3.4 (L) 12/13/2023   Diabetes: Patient does not have a diagnosis of diabetes. Lab Results  Component Value Date   HGBA1C 6.1 (H) 09/25/2023     Smoking Status:      Allergies:  Allergies[1]  Medications: No current facility-administered medications for this encounter.   Current Outpatient Medications  Medication Sig Dispense Refill   acetaminophen  (TYLENOL ) 500 MG tablet Take 1,000 mg by mouth every 8 (eight) hours as needed for mild pain or headache.     albuterol  (VENTOLIN  HFA) 108 (90 Base) MCG/ACT inhaler INHALE 2 PUFFS INTO LUNGS EVERY 6 HOURS AS NEEDED FOR WHEEZING OR SHORTNESS OF BREATH 8.5 g 1   allopurinol  (ZYLOPRIM ) 100 MG tablet TAKE 2 TABLETS(200 MG) BY MOUTH DAILY 180 tablet 0   amLODipine  (NORVASC ) 10 MG tablet Take 1 tablet (10 mg total) by mouth daily. 90 tablet 3   aspirin  EC 81 MG tablet Take 81 mg by mouth at bedtime. Swallow whole.     atorvastatin  (LIPITOR ) 80 MG tablet Take 1 tablet (80 mg total) by mouth daily. 90 tablet 3   benzonatate  (TESSALON ) 100 MG capsule Take 1 capsule (100 mg total) by mouth 3 (three) times daily as needed. 30 capsule 0   carvedilol  (COREG ) 12.5 MG tablet Take 1 tablet (12.5  mg total) by mouth 2 (two) times daily. 180 tablet 3   cyanocobalamin  (VITAMIN B12) 1000 MCG tablet Take 1 tablet (1,000 mcg total) by mouth daily. 90 tablet 3   dorzolamide -timolol  (COSOPT ) 2-0.5 % ophthalmic solution Place 1 drop into the left eye 2 (two) times daily. 1 mL 12   ezetimibe  (ZETIA ) 10 MG tablet TAKE 1 TABLET ONCE DAILY 90 tablet 3   feeding supplement (BOOST HIGH PROTEIN) LIQD Take 1 Container by mouth in the morning.     ferrous sulfate  325 (65 FE) MG EC tablet Take 1 tablet (325 mg total) by mouth daily with breakfast. 30 tablet 0   FLUoxetine  (PROZAC ) 20 MG capsule Take 1 capsule (20 mg total) by mouth daily. 90 capsule 3   fluticasone  (FLONASE ) 50 MCG/ACT nasal spray SHAKE LIQUID AND USE 2 SPRAYS IN EACH NOSTRIL DAILY AS NEEDED FOR RHINITIS OR ALLERGIES 16 g 11   furosemide  (LASIX ) 20 MG tablet Take 1 tablet (20 mg total) by mouth daily as needed for fluid or edema. 30 tablet 3   gabapentin  (NEURONTIN ) 300 MG capsule TAKE 1 CAPSULE(300 MG) BY MOUTH TWICE DAILY 60 capsule 5   guaiFENesin  (MUCINEX ) 600 MG 12 hr tablet Take 600 mg by mouth daily.     hydrocortisone  cream 0.5 % Apply 1 Application topically as needed for itching.     hydrOXYzine  (ATARAX ) 10 MG tablet Take 1-2 tablets (10-20 mg total) by mouth at bedtime as needed for itching (or sleep). 30 tablet 3   ipratropium (ATROVENT ) 0.02 % nebulizer solution Use 1 vial (0.5 mg total) by nebulization every 6 (six) hours as needed for wheezing or shortness of breath. 75 mL 12   mirtazapine  (REMERON ) 7.5 MG tablet TAKE 1 TABLET(7.5 MG) BY MOUTH AT BEDTIME 30 tablet 3   morphine  (MS CONTIN ) 15 MG 12 hr tablet Take 1 tablet (15 mg total) by mouth every 12 (twelve) hours. 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 2 each 5   oxyCODONE  10 MG TABS Take 1 tablet (10 mg total) by mouth every  4 (four) hours as needed for severe pain (pain score 7-10). 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 Max 6 pills/day-  since having shoulder surgery-  G89.3 chronic pain due to cancer- fill now 180 tablet 0   pantoprazole  (PROTONIX ) 40 MG tablet Take 1 tablet (40 mg total) by mouth daily. 90 tablet 3   potassium chloride  (KLOR-CON ) 10 MEQ tablet TAKE 1 TABLET(10 MEQ) BY MOUTH FOUR TIMES DAILY 120 tablet 3   tamsulosin  (FLOMAX ) 0.4 MG CAPS capsule Take 1 capsule (0.4 mg total) by mouth daily. 90 capsule 3   Tiotropium Bromide  Monohydrate (SPIRIVA  RESPIMAT) 2.5 MCG/ACT AERS Inhale 2 puffs into the lungs daily. (Patient taking differently: Inhale 2 puffs into the lungs daily as needed (shortness of breath).) 4 g 6   traZODone  (DESYREL ) 50 MG tablet TAKE 1/2 TO 1 TABLET(25 TO 50 MG) BY MOUTH AT BEDTIME AS NEEDED FOR SLEEP 30 tablet 3   triamcinolone  cream (KENALOG ) 0.1 % Apply 1 Application topically 2 (two) times daily as needed (to itchy sites).     trolamine salicylate (ASPERCREME) 10 % cream Apply 1 Application topically 2 (two) times daily as needed (arthritis pain).     zoledronic  acid (RECLAST ) 5 MG/100ML SOLN injection Inject 5 mg into the vein once. 09/11/2022 and 10/08/2023      No results found. However, due to the size of the patient record, not all encounters were searched. Please check Results Review for a complete set of results. No results found.  ROS: Pain with rom of the left upper extremity  Physical Exam: Alert and oriented 84 y.o. male in no acute distress Cranial nerves 2-12 intact Cervical spine: full rom with no tenderness, nv intact distally Chest: active breath sounds bilaterally, no wheeze rhonchi or rales Heart: regular rate and rhythm, no murmur Abd: non tender non distended with active bowel sounds Hip is stable with rom  Left shoulder painful and weak  rom Nv intact distally No rashes or edema  Assessment/Plan Assessment: left shoulder cuff arthropathy   Plan:  Patient will undergo a left reverse total shoulder by Dr. Kay at [facility] Risks benefits and expectations were discussed with the patient. Patient understand risks, benefits and expectations and wishes to proceed. Preoperative templating of the joint replacement has been completed, documented, and submitted to the Operating Room personnel in order to optimize intra-operative equipment management.   Arvella Fireman PA-C, MPAS [facility] is now [employer] [address], [address], [place], [place] [address] Phone: [phone] [email or web address] Facebook  Instagram  LinkedIn  Twitter        [1]  Allergies Allergen Reactions   Tape Other (See Comments)    Tears skin. Prefers paper tape, Please   Celebrex [Celecoxib] Hives and Itching   "

## 2024-02-27 ENCOUNTER — Other Ambulatory Visit: Payer: Self-pay

## 2024-02-27 MED ORDER — ALLOPURINOL 100 MG PO TABS: ORAL_TABLET | ORAL | 0 refills | Status: AC

## 2024-02-27 NOTE — Telephone Encounter (Signed)
 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   Okay to refill Allopurinol ?

## 2024-03-09 ENCOUNTER — Encounter: Admitting: Registered Nurse

## 2024-03-10 ENCOUNTER — Other Ambulatory Visit: Payer: Self-pay

## 2024-03-10 ENCOUNTER — Encounter (HOSPITAL_COMMUNITY): Payer: Self-pay

## 2024-03-10 ENCOUNTER — Encounter (HOSPITAL_COMMUNITY)
Admission: RE | Admit: 2024-03-10 | Discharge: 2024-03-10 | Disposition: A | Source: Ambulatory Visit | Attending: Orthopedic Surgery

## 2024-03-10 VITALS — BP 112/68 | HR 72 | Temp 98.6°F | Ht 68.0 in | Wt 135.0 lb

## 2024-03-10 DIAGNOSIS — I10 Essential (primary) hypertension: Secondary | ICD-10-CM | POA: Insufficient documentation

## 2024-03-10 DIAGNOSIS — Z01812 Encounter for preprocedural laboratory examination: Secondary | ICD-10-CM | POA: Insufficient documentation

## 2024-03-10 DIAGNOSIS — Z01818 Encounter for other preprocedural examination: Secondary | ICD-10-CM

## 2024-03-10 HISTORY — DX: Pneumonia, unspecified organism: J18.9

## 2024-03-10 LAB — CBC
HCT: 39 % (ref 39.0–52.0)
Hemoglobin: 12 g/dL — ABNORMAL LOW (ref 13.0–17.0)
MCH: 27.4 pg (ref 26.0–34.0)
MCHC: 30.8 g/dL (ref 30.0–36.0)
MCV: 89 fL (ref 80.0–100.0)
Platelets: 115 10*3/uL — ABNORMAL LOW (ref 150–400)
RBC: 4.38 MIL/uL (ref 4.22–5.81)
RDW: 14 % (ref 11.5–15.5)
WBC: 8.8 10*3/uL (ref 4.0–10.5)
nRBC: 0 % (ref 0.0–0.2)

## 2024-03-10 LAB — SURGICAL PCR SCREEN
MRSA, PCR: NEGATIVE
Staphylococcus aureus: NEGATIVE

## 2024-03-10 LAB — BASIC METABOLIC PANEL WITH GFR
Anion gap: 8 (ref 5–15)
BUN: 17 mg/dL (ref 8–23)
CO2: 28 mmol/L (ref 22–32)
Calcium: 8.9 mg/dL (ref 8.9–10.3)
Chloride: 101 mmol/L (ref 98–111)
Creatinine, Ser: 0.66 mg/dL (ref 0.61–1.24)
GFR, Estimated: >60 mL/min
Glucose, Bld: 175 mg/dL — ABNORMAL HIGH (ref 70–99)
Potassium: 4.1 mmol/L (ref 3.5–5.1)
Sodium: 138 mmol/L (ref 135–145)

## 2024-03-11 ENCOUNTER — Telehealth: Payer: Self-pay | Admitting: *Deleted

## 2024-03-11 ENCOUNTER — Telehealth: Payer: Self-pay

## 2024-03-11 ENCOUNTER — Encounter: Payer: Self-pay | Admitting: *Deleted

## 2024-03-11 MED ORDER — MORPHINE SULFATE ER 15 MG PO TBCR: 15.0000 mg | EXTENDED_RELEASE_TABLET | Freq: Two times a day (BID) | ORAL | 0 refills | Status: DC

## 2024-03-11 MED ORDER — OXYCODONE HCL 10 MG PO TABS: 10.0000 mg | ORAL_TABLET | ORAL | 0 refills | Status: AC | PRN

## 2024-03-11 NOTE — Telephone Encounter (Signed)
 Brandon [name] is calling for a refill on his oxycodone / morphine  sulfate, and to let Dr Cornelio know he is having surgery on 03/20/24

## 2024-03-11 NOTE — Progress Notes (Incomplete)
 " Case: [id number] Date/Time: 03/20/24 1600   Procedure: ARTHROPLASTY, SHOULDER, TOTAL, REVERSE (Left: Shoulder)   Anesthesia type: Choice   Diagnosis: Arthritis of left shoulder region [M19.012]   Pre-op diagnosis: Primary osteoarthritis, left shoulder   Location: [facility]OM 06 / [facility]   Surgeons: Kay Kemps, MD       DISCUSSION: Brandon [name] is an 84 yo male with PMH of former smoking, HTN, CAD s/p PCI to LAD (2022), AS s/p TAVR (2022), AAA s/p EVAR (02/2021), COPD, hx of TIA, GERD, hiatal hernia, esophageal stricture, Hep B, PMR on prednisone , chronic pain syndrome with narcotic dependence, hx of tonsil cancer s/p resection, radiation (~2020), depression, hx of lumbar fusion.   Review of anesthesia records shows that glidescope was been used for last 3 intubations.  Patient origincally scheduled for surgery on 12/13/23 however was febrile and hypoxic in short stay. Admitted for CAP with sepsis and discharged 12/14/23.  Patient followed up with his PCP Dr. Thedora on 01/08/24. Patient noted to be improving clinically. CXR obtained showed improving pneumonia. He was cleared for surgery:  Preoperative examination - Primary Brandon [name] is at moderate risk for complications. Prior recommendations regarding surgical risk remain. He is clear form his recent pneumonia, so this should no longer be a barrier to proceeding with surgery.  History of tonsillar squamous cell cancer diagnosed 2019, s/p radiation and resection.   Follows with cardiology for history of aortic stenosis s/p TAVR 06/07/2020 (echo 07/2021 with mean gradient 8 mmHg and trivial PVL), CAD (LHC in 2/22 with CTO RCA with collaterals, 95% pLAD treated with DES, 60-70% proximal LCx (FFR negative). No further exertional chest pain since PCI.), HTN, HLD, diastolic CHF.  Last seen in clinic on 01/29/24 by NP Clegg in heart failure clinic. He reported some congestion and mild DOE. Updated Echo recommended.  Patient underwent AAA repair in 02/2021. He  follows with Vascular and was last seen on 05/22/23. Duplex done and shows stent graft repair is patent without endoleak. Advised 1 year f/u.    VS: BP 112/68   Pulse 72   Temp 37 C (Oral)   Ht 5' 8 (1.727 m)   Wt 61.2 kg   SpO2 99%   BMI 20.53 kg/m   PROVIDERS: Thedora Garnette HERO, MD   LABS: Labs reviewed: Acceptable for surgery. (all labs ordered are listed, but only abnormal results are displayed)  Labs Reviewed  BASIC METABOLIC PANEL WITH GFR - Abnormal; Notable for the following components:      Result Value   Glucose, Bld 175 (*)    All other components within normal limits  CBC - Abnormal; Notable for the following components:   Hemoglobin 12.0 (*)    Platelets 115 (*)    All other components within normal limits  SURGICAL PCR SCREEN     CXR 01/08/24:   IMPRESSION: 1. Minimal residual opacity is present in the right mid lung zone. Overall improved. Consider 1 additional follow-up radiograph to demonstrate complete resolution.  EKG 06/12/23:   Sinus bradycardia with Blocked Premature atrial complexes When compared with ECG of 18-Jan-2022 00:50, ectopy is new   AAA Duplex 05/22/23:   Summary: Abdominal Aorta: The largest aortic diameter remains essentially unchanged compared to prior exam. Previous diameter measurement was 4.9 x 5.04 cm obtained on 05/03/2022. Bilateral common iliac stents appear patent.   Echo 07/13/2021:   IMPRESSIONS      1. Left ventricular ejection fraction, by estimation, is 65 to 70%. The left ventricle has  normal function. The left ventricle has no regional wall motion abnormalities. Left ventricular diastolic parameters are consistent with Grade I diastolic dysfunction (impaired relaxation).  2. Right ventricular systolic function is normal. The right ventricular size is normal.  3. Left atrial size was moderately dilated.  4. The mitral valve is normal in structure. No evidence of mitral valve regurgitation. No evidence of  mitral stenosis.  5. Mild TAVR perivalvular leak, 29 mm Edwards Sapien 3 THV via the subclavian approach on 06/07/20. The aortic valve has been repaired/replaced. Aortic valve regurgitation is not visualized. No aortic stenosis is present. There is a 29 mm Sapien prosthetic (TAVR) valve present in the aortic position. Procedure Date: 06/07/20. Echo findings are consistent with perivalvular leak of the aortic prosthesis. Aortic valve mean gradient measures 8.0 mmHg. Aortic valve Vmax measures 2.11 m/s.  6. Aortic dilatation noted. There is mild dilatation of the ascending aorta, measuring 40 mm.  7. The inferior vena cava is normal in size with greater than 50% respiratory variability, suggesting right atrial pressure of 3 mmHg.   Cath 04/27/2020:   1.  Successful orbital atherectomy and stenting of severe calcific stenosis in the proximal LAD, procedure guided by OCT imaging, treated with a 4.0 x 12 mm resolute Onyx DES 2.  Moderate left circumflex stenosis with negative pressure wire analysis.   Past Medical History:  Diagnosis Date   Abdominal aneurysm    Arthritis    all over (07/19/2016)   BPH (benign prostatic hypertrophy)    CAD (coronary artery disease)    Chicken pox    Chronic lower back pain    s/p surgical fusion   COPD (chronic obstructive pulmonary disease) (HCC)    Depression    Diverticulosis    Esophageal stricture    GERD (gastroesophageal reflux disease)    Heart murmur    Hepatitis B 1984   Hiatal hernia    History of radiation therapy 01/16/18- 03/05/18   Left Tonsil, 66 Gy in 33 fractions to high risk nodal echelons.    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 Wonda and Dr Lucas    Severe aortic stenosis    Stroke [facility])    TIA - 20 years ago   TIA  (transient ischemic attack) 1990s   hx   tonsillar ca dx'd 11/2017   Tubular adenoma of colon 2009    Past Surgical History:  Procedure Laterality Date   ABDOMINAL AORTIC ENDOVASCULAR STENT GRAFT N/A 03/03/2021   Procedure: ABDOMINAL AORTIC ENDOVASCULAR STENT GRAFT;  Surgeon: Sheree Penne Bruckner, MD;  Location: [facility] OR;  Service: Vascular;  Laterality: N/A;   ABDOMINAL AORTOGRAM W/LOWER EXTREMITY N/A 04/10/2021   Procedure: ABDOMINAL AORTOGRAM W/LOWER EXTREMITY;  Surgeon: Sheree Penne Bruckner, MD;  Location: [facility] INVASIVE CV LAB;  Service: Cardiovascular;  Laterality: N/A;   BACK SURGERY     CARDIAC CATHETERIZATION     CATARACT EXTRACTION W/ INTRAOCULAR LENS  IMPLANT, BILATERAL Bilateral 01/2012 - 02/2012   CORONARY ATHERECTOMY N/A 04/27/2020   Procedure: CORONARY ATHERECTOMY;  Surgeon: Wonda Sharper, MD;  Location: [facility] INVASIVE CV LAB;  Service: Cardiovascular;  Laterality: N/A;   CORONARY IMAGING/OCT N/A 04/27/2020   Procedure: INTRAVASCULAR IMAGING/OCT;  Surgeon: Wonda Sharper, MD;  Location: [name] INVASIVE CV LAB;  Service: Cardiovascular;  Laterality: N/A;   CORONARY PRESSURE/FFR STUDY N/A 04/27/2020   Procedure: INTRAVASCULAR PRESSURE WIRE/FFR STUDY;  Surgeon: Wonda Sharper, MD;  Location: [facility] INVASIVE CV LAB;  Service: Cardiovascular;  Laterality: N/A;   CORONARY STENT INTERVENTION N/A 04/27/2020   Procedure: CORONARY STENT INTERVENTION;  Surgeon: Wonda Sharper, MD;  Location: [facility] INVASIVE CV LAB;  Service: Cardiovascular;  Laterality: N/A;   DIRECT LARYNGOSCOPY Left 10/09/2017   Procedure: DIRECT LARYNGOSCOPY WITH BOPSY;  Surgeon: Arlana Arnt, MD;  Location: [facility];  Service: ENT;  Laterality: Left;   ESOPHAGOGASTRODUODENOSCOPY (EGD) WITH ESOPHAGEAL DILATION     couple times (07/19/2016)   ESOPHAGOSCOPY Left 10/09/2017   Procedure: ESOPHAGOSCOPY;  Surgeon: Arlana Arnt, MD;  Location: [facility];  Service: ENT;  Laterality: Left;   IR  GASTROSTOMY TUBE MOD SED  01/08/2018   IR THORACENTESIS RIGHT ASP PLEURAL SPACE W/IMG GUIDE  07/19/2016   LAPAROSCOPIC CHOLECYSTECTOMY  1984   LUMBAR DISC SURGERY  05/1996   L4-5; Dr. Alix ILES LAMINECTOMY/DECOMPRESSION MICRODISCECTOMY  10/2002   L3-4. Dr. Alix   MULTIPLE TOOTH EXTRACTIONS  1980s   PARTIAL GLOSSECTOMY  12/02/2017   Dr. Lauralee- [facility]   pharyngoplasty for closure of tingue base defect  12/02/2017   Dr. Lauralee- [facility]   PICC LINE INSERTION  07/15/2016   POSTERIOR LUMBAR FUSION  09/1996   Ray cage, L4-5 Dr. Mora   PROSTATE BIOPSY  ~ 2017   radical tonsillectomy Left 12/02/2017   Dr[facility]alee at [facility]   RIGHT HEART CATH AND CORONARY ANGIOGRAPHY N/A 03/31/2020   Procedure: RIGHT HEART CATH AND CORONARY ANGIOGRAPHY;  Surgeon: Rolan Ezra RAMAN, MD;  Location: [facility] INVASIVE CV LAB;  Service: Cardiovascular;  Laterality: N/A;   RIGID BRONCHOSCOPY Left 10/09/2017   Procedure: RIGID BRONCHOSCOPY;  Surgeon: Arlana Arnt, MD;  Location: [facility];  Service: ENT;  Laterality: Left;   TEE WITHOUT CARDIOVERSION N/A 06/07/2020   Procedure: TRANSESOPHAGEAL ECHOCARDIOGRAM (TEE);  Surgeon: Wonda Sharper, MD;  Location: [facility] OR;  Service: Open Heart Surgery;  Laterality: N/A;   TONSILLECTOMY     TOTAL HIP ARTHROPLASTY Left 12/04/2020   Procedure: TOTAL HIP ARTHROPLASTY ANTERIOR APPROACH;  Surgeon: Fidel Rogue, MD;  Location[facility];  Service: Orthopedics;  Laterality: Left;   TOTAL HIP ARTHROPLASTY Left 11/2020   TRACHEOSTOMY  12/02/2017   Dr. Lauralee- [facility]   ULTRASOUND GUIDANCE FOR VASCULAR ACCESS Right 06/07/2020   Procedure: ULTRASOUND GUIDANCE FOR VASCULAR ACCESS;  Surgeon: Wonda Sharper, MD;  Location: [facility] OR;  Service: Open Heart Surgery;  Laterality: Right;   ULTRASOUND GUIDANCE FOR VASCULAR ACCESS Bilateral 03/03/2021   Procedure: ULTRASOUND GUIDANCE FOR VASCULAR ACCESS, BILATERAL FEMORAL ARTERIES;  Surgeon: Sheree Penne Bruckner, MD;   Location: [facility] OR;  Service: Vascular;  Laterality: Bilateral;   VASCULAR SURGERY      MEDICATIONS:  acetaminophen  (TYLENOL ) 500 MG tablet   albuterol  (VENTOLIN  HFA) 108 (90 Base) MCG/ACT inhaler   allopurinol  (ZYLOPRIM ) 100 MG tablet   amLODipine  (NORVASC ) 10 MG tablet   aspirin  EC 81 MG tablet   atorvastatin  (LIPITOR ) 80 MG tablet   carvedilol  (COREG ) 12.5 MG tablet   cyanocobalamin  (VITAMIN B12) 1000 MCG tablet   feeding supplement (BOOST HIGH PROTEIN) LIQD   fluticasone  (FLONASE ) 50 MCG/ACT nasal spray   gabapentin  (NEURONTIN ) 300 MG capsule   guaiFENesin  (MUCINEX ) 600 MG 12 hr tablet   hydrocortisone  cream 0.5 %   hydrOXYzine  (ATARAX ) 10 MG tablet   ipratropium (ATROVENT ) 0.02 % nebulizer solution  morphine  (MS CONTIN ) 15 MG 12 hr tablet   naloxone  (NARCAN ) nasal spray 4 mg/0.1 mL   Oxycodone  HCl 10 MG TABS   pantoprazole  (PROTONIX ) 40 MG tablet   Polyethyl Glycol-Propyl Glycol (LUBRICANT EYE DROPS) 0.4-0.3 % SOLN   potassium chloride  (KLOR-CON ) 10 MEQ tablet   predniSONE  (DELTASONE ) 1 MG tablet   predniSONE  (DELTASONE ) 5 MG tablet   tamsulosin  (FLOMAX ) 0.4 MG CAPS capsule   traZODone  (DESYREL ) 50 MG tablet   triamcinolone  cream (KENALOG ) 0.1 %   trolamine salicylate (ASPERCREME) 10 % cream   zoledronic  acid (RECLAST ) 5 MG/100ML SOLN injection   No current facility-administered medications for this encounter.          "

## 2024-03-11 NOTE — Telephone Encounter (Signed)
 Patient called stating that he is having itching on his back, arms.  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  He would like to know if he can get something he can take orally for the itching?  Please review and advise.  Thanks. Dm/cma

## 2024-03-11 NOTE — Addendum Note (Signed)
 Addended by: Mykeisha Dysert on: 03/11/2024 11:50 AM   Modules accepted: Orders

## 2024-03-11 NOTE — Telephone Encounter (Unsigned)
 Copied from CRM [id number]. Topic: Clinical - Medication Question >> Mar 11, 2024  1:37 PM Deaijah H wrote: Reason for CRM: Patient called in he's gotten some itching medication from Dr. Thedora in the past and would like to know if he can call something in. Please call if needed.

## 2024-03-12 ENCOUNTER — Encounter: Attending: Physical Medicine and Rehabilitation | Admitting: Registered Nurse

## 2024-03-12 ENCOUNTER — Encounter: Payer: Self-pay | Admitting: Registered Nurse

## 2024-03-12 VITALS — BP 122/52 | HR 62 | Ht 68.0 in | Wt 138.6 lb

## 2024-03-12 DIAGNOSIS — G8929 Other chronic pain: Secondary | ICD-10-CM

## 2024-03-12 DIAGNOSIS — Z5181 Encounter for therapeutic drug level monitoring: Secondary | ICD-10-CM

## 2024-03-12 DIAGNOSIS — M5416 Radiculopathy, lumbar region: Secondary | ICD-10-CM

## 2024-03-12 DIAGNOSIS — Z79891 Long term (current) use of opiate analgesic: Secondary | ICD-10-CM

## 2024-03-12 DIAGNOSIS — M25512 Pain in left shoulder: Secondary | ICD-10-CM

## 2024-03-12 DIAGNOSIS — G894 Chronic pain syndrome: Secondary | ICD-10-CM

## 2024-03-12 MED ORDER — MORPHINE SULFATE ER 15 MG PO TBCR: 15.0000 mg | EXTENDED_RELEASE_TABLET | Freq: Two times a day (BID) | ORAL | 0 refills | Status: AC

## 2024-03-12 NOTE — Telephone Encounter (Signed)
 Patient notified VIA phone. Dm/cma

## 2024-03-12 NOTE — Progress Notes (Signed)
 "  Subjective:    Patient ID: Brandon [name], male    DOB: [birth year], 84 y.o.   MRN: [id number]  HPI: Brandon [name] is a 84 y.o. male who returns for follow up appointment for chronic pain and medication refill. He states his pain is located in his left shoulder and lower back pain radiating into his right buttock and right lower extremity. He rates his pain 5. His current exercise regime is walking and performing stretching exercises.  Brandon [name] Morphine  equivalent is 120.00 MME.   UDS ordered today     Pain Inventory Average Pain 5 Pain Right Now 5 My pain is stabbing and aching  In the last 24 hours, has pain interfered with the following? General activity 6 Relation with others 6 Enjoyment of life 6 What TIME of day is your pain at its worst? morning  and daytime Sleep (in general) NA  Pain is worse with: bending and standing Pain improves with: rest, medication, and injections Relief from Meds: na  Family History  Problem Relation Age of Onset   Heart disease Father 27       Living   Coronary artery disease Father        CABG   Alzheimer's disease Mother 32       Deceased   Arthritis Mother    Aneurysm Brother    Stomach cancer Maternal Uncle    Brain cancer Maternal Aunt        x2   Obesity Daughter        Had Bypass Sx   Social History   Socioeconomic History   Marital status: Married    Spouse name: etta   Number of children: 2   Years of education: Not on file   Highest education level: Not on file  Occupational History   Occupation: retired    Associate Professor: RETIRED    Comment: disabled due to back problems  Tobacco Use   Smoking status: Former    Current packs/day: 0.00    Average packs/day: 1.5 packs/day for 50.0 years (75.0 ttl pk-yrs)    Types: Cigarettes    Start date: 09/06/1967    Quit date: 09/05/2017    Years since quitting: 6.5    Passive exposure: Never   Smokeless tobacco: Never   Tobacco comments:    smoked less than 1 ppd for 40+ years;    Vaping Use   Vaping status: Former  Substance and Sexual Activity   Alcohol  use: Not Currently    Comment: occas   Drug use: Yes    Types: Morphine , Oxycodone     Comment: has prescription   Sexual activity: Not Currently  Other Topics Concern   Not on file  Social History Narrative   ** Merged History Encounter **       Married (3rd), Etta. 2 children from 1st marriage, 4 step children.    Retired on disability due to back    Former Runner, Broadcasting/film/video.   restores antique furniture for a hobby.       Cell # [phone]   Social Drivers of Health   Tobacco Use: Medium Risk (03/10/2024)   Patient History    Smoking Tobacco Use: Former    Smokeless Tobacco Use: Never    Passive Exposure: Never  Physicist, Medical Strain: Low Risk (06/21/2023)   Overall Financial Resource Strain (CARDIA)    Difficulty of Paying Living Expenses: Not hard at all  Food Insecurity: No Food Insecurity (12/14/2023)  Epic    Worried About Programme Researcher, Broadcasting/film/video in the Last Year: Never true    [employer] of Food in the Last Year: Never true  Transportation Needs: No Transportation Needs (12/14/2023)   Epic    Lack of Transportation (Medical): No    Lack of Transportation (Non-Medical): No  Physical Activity: Inactive (06/21/2023)   Exercise Vital Sign    Days of Exercise per Week: 0 days    Minutes of Exercise per Session: 0 min  Stress: No Stress Concern Present (06/21/2023)   [employer] of Occupational Health - Occupational Stress Questionnaire    Feeling of Stress : Not at all  Social Connections: Socially Integrated (12/13/2023)   Social Connection and Isolation Panel    Frequency of Communication with Friends and Family: Never    Frequency of Social Gatherings with Friends and Family: More than three times a week    Attends Religious Services: More than 4 times per year    Active Member of Clubs or Organizations: No    Attends Engineer, Structural: More than 4 times  per year    Marital Status: Married  Depression (PHQ2-9): Low Risk (01/06/2024)   Depression (PHQ2-9)    PHQ-2 Score: 0  Recent Concern: Depression (PHQ2-9) - Medium Risk (11/26/2023)   Depression (PHQ2-9)    PHQ-2 Score: 5  Alcohol  Screen: Low Risk (06/21/2023)   Alcohol  Screen    Last Alcohol  Screening Score (AUDIT): 0  Housing: Low Risk (12/14/2023)   Epic    Unable to Pay for Housing in the Last Year: No    Number of Times Moved in the Last Year: 0    Homeless in the Last Year: No  Utilities: Not At Risk (12/14/2023)   Epic    Threatened with loss of utilities: No  Health Literacy: Adequate Health Literacy (06/21/2023)   B1300 Health Literacy    Frequency of need for help with medical instructions: Never   Past Surgical History:  Procedure Laterality Date   ABDOMINAL AORTIC ENDOVASCULAR STENT GRAFT N/A 03/03/2021   Procedure: ABDOMINAL AORTIC ENDOVASCULAR STENT GRAFT;  Surgeon: Sheree Penne Bruckner, MD;  Location: [facility] OR;  Service: Vascular;  Laterality: N/A;   ABDOMINAL AORTOGRAM W/LOWER EXTREMITY N/A 04/10/2021   Procedure: ABDOMINAL AORTOGRAM W/LOWER EXTREMITY;  Surgeon: Sheree Penne Bruckner, MD;  Location: [facility] INVASIVE CV LAB;  Service: Cardiovascular;  Laterality: N/A;   BACK SURGERY     CARDIAC CATHETERIZATION     CATARACT EXTRACTION W/ INTRAOCULAR LENS  IMPLANT, BILATERAL Bilateral 01/2012 - 02/2012   CORONARY ATHERECTOMY N/A 04/27/2020   Procedure: CORONARY ATHERECTOMY;  Surgeon: Wonda Sharper, MD;  Location: [facility] INVASIVE CV LAB;  Service: Cardiovascular;  Laterality: N/A;   CORONARY IMAGING/OCT N/A 04/27/2020   Procedure: INTRAVASCULAR IMAGING/OCT;  Surgeon: Wonda Sharper, MD;  Location: [facility] INVASIVE CV LAB;  Service: Cardiovascular;  Laterality: N/A;   CORONARY PRESSURE/FFR STUDY N/A 04/27/2020   Procedure: INTRAVASCULAR PRESSURE WIRE/FFR STUDY;  Surgeon: Wonda Sharper, MD;  Location: [facility] INVASIVE CV LAB;  Service: Cardiovascular;  Laterality: N/A;   CORONARY  STENT INTERVENTION N/A 04/27/2020   Procedure: CORONARY STENT INTERVENTION;  Surgeon: Wonda Sharper, MD;  Location: [facility] INVASIVE CV LAB;  Service: Cardiovascular;  Laterality: N/A;   DIRECT LARYNGOSCOPY Left 10/09/2017   Procedure: DIRECT LARYNGOSCOPY WITH BOPSY;  Surgeon: Arlana Arnt, MD;  Location: [facility];  Service: ENT;  Laterality: Left;   ESOPHAGOGASTRODUODENOSCOPY (EGD) WITH ESOPHAGEAL DILATION     couple times (07/19/2016)  ESOPHAGOSCOPY Left 10/09/2017   Procedure: ESOPHAGOSCOPY;  Surgeon: Arlana Arnt, MD;  Location: [facility];  Service: ENT;  Laterality: Left;   IR GASTROSTOMY TUBE MOD SED  01/08/2018   IR THORACENTESIS RIGHT ASP PLEURAL SPACE W/IMG GUIDE  07/19/2016   LAPAROSCOPIC CHOLECYSTECTOMY  1984   LUMBAR DISC SURGERY  05/1996   L4-5; Dr. Alix ILES LAMINECTOMY/DECOMPRESSION MICRODISCECTOMY  10/2002   L3-4. Dr. Alix   MULTIPLE TOOTH EXTRACTIONS  1980s   PARTIAL GLOSSECTOMY  12/02/2017   Dr. [facility]alee- [facility]   pharyngoplasty for closure of tingue base defect  12/02/2017   Dr. [facility]alee- [facility]   PICC LINE INSERTION  07/15/2016   POSTERIOR LUMBAR FUSION  09/1996   Ray cage, L4-5 Dr. Mora   PROSTATE BIOPSY  ~ 2017   radical tonsillectomy Left 12/02/2017   [facility]alee at [facility]   RIGHT HEART CATH AND CORONARY ANGIOGRAPHY N/A 03/31/2020   Procedure: RIGHT HEART CATH AND CORONARY ANGIOGRAPHY;  Surgeon: Rolan Ezra RAMAN, MD;  Location: [facility] INVASIVE CV LAB;  Service: Cardiovascular;  Laterality: N/A;   RIGID BRONCHOSCOPY Left 10/09/2017   Procedure: RIGID BRONCHOSCOPY;  Surgeon: Arlana Arnt, MD;  Location: [facility];  Service: ENT;  Laterality: Left;   TEE WITHOUT CARDIOVERSION N/A 06/07/2020   Procedure: TRANSESOPHAGEAL ECHOCARDIOGRAM (TEE);  Surgeon: Wonda Sharper, MD;  Location: [facility] OR;  Service: Open Heart Surgery;  Laterality: N/A;   TONSILLECTOMY     TOTAL HIP ARTHROPLASTY Left 12/04/2020    Procedure: TOTAL HIP ARTHROPLASTY ANTERIOR APPROACH;  Surgeon: Fidel Rogue, MD;  Location: [facility] ORS;  Service: Orthopedics;  Laterality: Left;   TOTAL HIP ARTHROPLASTY Left 11/2020   TRACHEOSTOMY  12/02/2017   Dr. Lauralee- [facility]   ULTRASOUND GUIDANCE FOR VASCULAR ACCESS Right 06/07/2020   Procedure: ULTRASOUND GUIDANCE FOR VASCULAR ACCESS;  Surgeon: Wonda Sharper, MD;  Location: [facility] OR;  Service: Open Heart Surgery;  Laterality: Right;   ULTRASOUND GUIDANCE FOR VASCULAR ACCESS Bilateral 03/03/2021   Procedure: ULTRASOUND GUIDANCE FOR VASCULAR ACCESS, BILATERAL FEMORAL ARTERIES;  Surgeon: Sheree Penne Bruckner, MD;  Location: [facility] OR;  Service: Vascular;  Laterality: Bilateral;   VASCULAR SURGERY     Past Surgical History:  Procedure Laterality Date   ABDOMINAL AORTIC ENDOVASCULAR STENT GRAFT N/A 03/03/2021   Procedure: ABDOMINAL AORTIC ENDOVASCULAR STENT GRAFT;  Surgeon: Sheree Penne Bruckner, MD;  Location: [facility] OR;  Service: Vascular;  Laterality: N/A;   ABDOMINAL AORTOGRAM W/LOWER EXTREMITY N/A 04/10/2021   Procedure: ABDOMINAL AORTOGRAM W/LOWER EXTREMITY;  Surgeon: Sheree Penne Bruckner, MD;  Location: [facility] INVASIVE CV LAB;  Service: Cardiovascular;  Laterality: N/A;   BACK SURGERY     CARDIAC CATHETERIZATION     CATARACT EXTRACTION W/ INTRAOCULAR LENS  IMPLANT, BILATERAL Bilateral 01/2012 - 02/2012   CORONARY ATHERECTOMY N/A 04/27/2020   Procedure: CORONARY ATHERECTOMY;  Surgeon: Wonda Sharper, MD;  Location: [facility] INVASIVE CV LAB;  Service: Cardiovascular;  Laterality: N/A;   CORONARY IMAGING/OCT N/A 04/27/2020   Procedure: INTRAVASCULAR IMAGING/OCT;  Surgeon: Wonda Sharper, MD;  Location: [facility] INVASIVE CV LAB;  Service: Cardiovascular;  Laterality: N/A;   CORONARY PRESSURE/FFR STUDY N/A 04/27/2020   Procedure: INTRAVASCULAR PRESSURE WIRE/FFR STUDY;  Surgeon: Wonda Sharper, MD;  Location: [facility] INVASIVE CV LAB;  Service: Cardiovascular;  Laterality: N/A;   CORONARY STENT INTERVENTION  N/A 04/27/2020   Procedure: CORONARY STENT INTERVENTION;  Surgeon: Wonda Sharper, MD;  Location: [facility] INVASIVE CV LAB;  Service: Cardiovascular;  Laterality: N/A;   DIRECT LARYNGOSCOPY Left 10/09/2017  Procedure: DIRECT LARYNGOSCOPY WITH BOPSY;  Surgeon: Arlana Arnt, MD;  Location: [facility];  Service: ENT;  Laterality: Left;   ESOPHAGOGASTRODUODENOSCOPY (EGD) WITH ESOPHAGEAL DILATION     couple times (07/19/2016)   ESOPHAGOSCOPY Left 10/09/2017   Procedure: ESOPHAGOSCOPY;  Surgeon: Arlana Arnt, MD;  Location: [facility];  Service: ENT;  Laterality: Left;   IR GASTROSTOMY TUBE MOD SED  01/08/2018   IR THORACENTESIS RIGHT ASP PLEURAL SPACE W/IMG GUIDE  07/19/2016   LAPAROSCOPIC CHOLECYSTECTOMY  1984   LUMBAR DISC SURGERY  05/1996   L4-5; Dr. Alix ILES LAMINECTOMY/DECOMPRESSION MICRODISCECTOMY  10/2002   L3-4. Dr. Alix   MULTIPLE TOOTH EXTRACTIONS  1980s   PARTIAL GLOSSECTOMY  12/02/2017   Dr. [facility]alee- [facility]   pharyngoplasty for closure of tingue base defect  12/02/2017   Dr. [facility]alee- [facility]   PICC LINE INSERTION  07/15/2016   POSTERIOR LUMBAR FUSION  09/1996   Ray cage, L4-5 Dr. Mora   PROSTATE BIOPSY  ~ 2017   radical tonsillectomy Left 12/02/2017   [facility]alee at [facility]   RIGHT HEART CATH AND CORONARY ANGIOGRAPHY N/A 03/31/2020   Procedure: RIGHT HEART CATH AND CORONARY ANGIOGRAPHY;  Surgeon: Rolan Ezra RAMAN, MD;  Location: [facility] INVASIVE CV LAB;  Service: Cardiovascular;  Laterality: N/A;   RIGID BRONCHOSCOPY Left 10/09/2017   Procedure: RIGID BRONCHOSCOPY;  Surgeon: Arlana Arnt, MD;  Location: [facility];  Service: ENT;  Laterality: Left;   TEE WITHOUT CARDIOVERSION N/A 06/07/2020   Procedure: TRANSESOPHAGEAL ECHOCARDIOGRAM (TEE);  Surgeon: Wonda Sharper, MD;  Location: [facility] OR;  Service: Open Heart Surgery;  Laterality: N/A;   TONSILLECTOMY     TOTAL HIP ARTHROPLASTY Left 12/04/2020   Procedure: TOTAL HIP  ARTHROPLASTY ANTERIOR APPROACH;  Surgeon: Fidel Rogue, MD;  Location: [facility];  Service: Orthopedics;  Laterality: Left;   TOTAL HIP ARTHROPLASTY Left 11/2020   TRACHEOSTOMY  12/02/2017   Dr. [facility]alee- [facility]   ULTRASOUND GUIDANCE FOR VASCULAR ACCESS Right 06/07/2020   Procedure: ULTRASOUND GUIDANCE FOR VASCULAR ACCESS;  Surgeon: Wonda Sharper, MD;  Location: [facility] OR;  Service: Open Heart Surgery;  Laterality: Right;   ULTRASOUND GUIDANCE FOR VASCULAR ACCESS Bilateral 03/03/2021   Procedure: ULTRASOUND GUIDANCE FOR VASCULAR ACCESS, BILATERAL FEMORAL ARTERIES;  Surgeon: Sheree Penne Bruckner, MD;  Location: [facility] OR;  Service: Vascular;  Laterality: Bilateral;   VASCULAR SURGERY     Past Medical History:  Diagnosis Date   Abdominal aneurysm    Arthritis    all over (07/19/2016)   BPH (benign prostatic hypertrophy)    CAD (coronary artery disease)    Chicken pox    Chronic lower back pain    s/p surgical fusion   COPD (chronic obstructive pulmonary disease) (HCC)    Depression    Diverticulosis    Esophageal stricture    GERD (gastroesophageal reflux disease)    Heart murmur    Hepatitis B 1984   Hiatal hernia    History of radiation therapy 01/16/18- 03/05/18   Left Tonsil, 66 Gy in 33 fractions to high risk nodal echelons.    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 Wonda and Dr Lucas    Severe aortic stenosis    Stroke [facility])    TIA -  20 years ago   TIA (transient ischemic attack) 1990s   hx   tonsillar ca dx'd 11/2017   Tubular adenoma of colon 2009   There were no vitals taken for this visit.  Opioid Risk Score:   Fall Risk Score:  `1  Depression screen [facility] 2/9     01/06/2024   10:55 AM 11/26/2023    3:36 PM 11/11/2023    1:22 PM 11/06/2023   11:03 AM 09/11/2023    10:34 AM 07/12/2023   11:31 AM 06/21/2023    9:40 AM  Depression screen PHQ 2/9  Decreased Interest 0 0 0 0 0 0 0  Down, Depressed, Hopeless 0 0 0 0 0 0 0  PHQ - 2 Score 0 0 0 0 0 0 0  Altered sleeping  2    0 0  Tired, decreased energy  2    0 1  Change in appetite  1    0 3  Feeling bad or failure about yourself   0    0 0  Trouble concentrating  0    0 0  Moving slowly or fidgety/restless  0    0 0  Suicidal thoughts  0    0 0  PHQ-9 Score  5     0  4   Difficult doing work/chores  Somewhat difficult    Not difficult at all Somewhat difficult     Data saved with a previous flowsheet row definition     Review of Systems  Musculoskeletal:  Positive for back pain.       Left shoulder  All other systems reviewed and are negative.      Objective:   Physical Exam Vitals and nursing note reviewed.  Constitutional:      Appearance: Normal appearance.  Cardiovascular:     Rate and Rhythm: Normal rate and regular rhythm.     Pulses: Normal pulses.     Heart sounds: Normal heart sounds.  Pulmonary:     Effort: Pulmonary effort is normal.     Breath sounds: Normal breath sounds.  Musculoskeletal:     Comments: Normal Muscle Bulk and Muscle Testing Reveals:  Upper Extremities: Right: Upper Full ROM and Muscle Strength  5/5 Left Upper Extremity: Decreased ROM 20 Degrees and Muscle Strength 5/5 Left AC Joint Tenderness Thoracic Paraspinal Tenderness: T-1-T-4 Mainly Left Side  Lumbar Paraspinal Tenderness: L-4-L-5 Lower Extremities: Full ROM and Muscle Strength 5/5 Arises from Table slowly  Narrow Based  Gait     Skin:    General: Skin is warm and dry.  Neurological:     Mental Status: He is alert and oriented to person, place, and time.  Psychiatric:        Mood and Affect: Mood normal.        Behavior: Behavior normal.          Assessment & Plan:  Lumbar Post-Laminectomy Syndrome: Lumbar Spondylosis Continue HEP as Tolerated. Continue to Monitor.  03/12/2024 Lumbar Radiculitis: Continue HEP as Tolerated. Continue to Monitor. Brandon [name] has stopped taking his gabapentin , he reports it was ineffective.  Continue to Monitor. 03/12/2024. Left Shoulder Pain: Brandon [name] is scheduled for: 12/06/2023: Dr Kay: ARTHROPLASTY, SHOULDER, TOTAL, REVERSE  Chronic Pain Syndrome: Continue MS Contin  15 mg every 12 hours #60,  Refilled: Oxycodone  10 mg one tablet every 4 hours #180. Second script  of Morphine  sent to accommodate scheduled appointment. We will continue the opioid monitoring program, this consists of regular clinic visits, examinations,  urine drug screen, pill counts as well as use of [place]  Controlled Substance Reporting system. A 12 month History has been reviewed on the   Controlled Substance Reporting System 03/12/2024   4. Restless Leg Syndrome: Continue Gabapentin .  Continue current medication regimen. Continue to Monitor. 03/12/2024   F/U in 2 months i  "

## 2024-03-20 ENCOUNTER — Encounter (HOSPITAL_COMMUNITY): Admission: RE | Payer: Self-pay | Source: Ambulatory Visit

## 2024-03-20 ENCOUNTER — Ambulatory Visit (HOSPITAL_COMMUNITY): Admission: RE | Admit: 2024-03-20 | Admitting: Orthopedic Surgery

## 2024-03-20 ENCOUNTER — Encounter (HOSPITAL_COMMUNITY): Payer: Self-pay | Admitting: Medical

## 2024-05-06 ENCOUNTER — Encounter: Admitting: Physical Medicine and Rehabilitation

## 2024-06-22 ENCOUNTER — Ambulatory Visit

## 2024-06-23 ENCOUNTER — Ambulatory Visit: Admitting: Rheumatology

## 2024-09-07 ENCOUNTER — Encounter: Admitting: Physical Medicine and Rehabilitation

## 2024-09-08 ENCOUNTER — Encounter: Admitting: Registered Nurse
